# Patient Record
Sex: Male | Born: 1940 | Race: White | Hispanic: No | Smoking: Former smoker
Health system: Southern US, Community
[De-identification: ages and names within clinical notes are randomized; demographics above are authoritative.]

## PROBLEM LIST (undated history)

## (undated) DIAGNOSIS — I509 Heart failure, unspecified: Secondary | ICD-10-CM

## (undated) DIAGNOSIS — M1990 Unspecified osteoarthritis, unspecified site: Secondary | ICD-10-CM

## (undated) DIAGNOSIS — I629 Nontraumatic intracranial hemorrhage, unspecified: Secondary | ICD-10-CM

## (undated) DIAGNOSIS — N400 Enlarged prostate without lower urinary tract symptoms: Secondary | ICD-10-CM

## (undated) DIAGNOSIS — Z950 Presence of cardiac pacemaker: Secondary | ICD-10-CM

## (undated) DIAGNOSIS — J40 Bronchitis, not specified as acute or chronic: Secondary | ICD-10-CM

## (undated) DIAGNOSIS — J42 Unspecified chronic bronchitis: Secondary | ICD-10-CM

## (undated) DIAGNOSIS — I272 Pulmonary hypertension, unspecified: Secondary | ICD-10-CM

## (undated) DIAGNOSIS — E119 Type 2 diabetes mellitus without complications: Secondary | ICD-10-CM

## (undated) DIAGNOSIS — I10 Essential (primary) hypertension: Secondary | ICD-10-CM

## (undated) DIAGNOSIS — R9389 Abnormal findings on diagnostic imaging of other specified body structures: Secondary | ICD-10-CM

## (undated) DIAGNOSIS — G43909 Migraine, unspecified, not intractable, without status migrainosus: Secondary | ICD-10-CM

## (undated) DIAGNOSIS — E785 Hyperlipidemia, unspecified: Secondary | ICD-10-CM

## (undated) DIAGNOSIS — J90 Pleural effusion, not elsewhere classified: Secondary | ICD-10-CM

## (undated) DIAGNOSIS — G4700 Insomnia, unspecified: Secondary | ICD-10-CM

## (undated) DIAGNOSIS — I5032 Chronic diastolic (congestive) heart failure: Secondary | ICD-10-CM

## (undated) DIAGNOSIS — Z9981 Dependence on supplemental oxygen: Secondary | ICD-10-CM

## (undated) DIAGNOSIS — R011 Cardiac murmur, unspecified: Secondary | ICD-10-CM

## (undated) DIAGNOSIS — I4891 Unspecified atrial fibrillation: Secondary | ICD-10-CM

## (undated) DIAGNOSIS — I739 Peripheral vascular disease, unspecified: Secondary | ICD-10-CM

## (undated) DIAGNOSIS — I2510 Atherosclerotic heart disease of native coronary artery without angina pectoris: Secondary | ICD-10-CM

## (undated) DIAGNOSIS — D649 Anemia, unspecified: Secondary | ICD-10-CM

## (undated) HISTORY — PX: CARDIAC CATHETERIZATION: SHX172

## (undated) HISTORY — DX: Hyperlipidemia, unspecified: E78.5

## (undated) HISTORY — DX: Pulmonary hypertension, unspecified: I27.20

## (undated) HISTORY — DX: Chronic diastolic (congestive) heart failure: I50.32

## (undated) HISTORY — DX: Benign prostatic hyperplasia without lower urinary tract symptoms: N40.0

## (undated) HISTORY — DX: Abnormal findings on diagnostic imaging of other specified body structures: R93.89

## (undated) HISTORY — PX: TONSILLECTOMY: SUR1361

## (undated) HISTORY — DX: Bronchitis, not specified as acute or chronic: J40

## (undated) HISTORY — DX: Pleural effusion, not elsewhere classified: J90

## (undated) HISTORY — PX: PLEURAL SCARIFICATION: SHX748

## (undated) HISTORY — DX: Essential (primary) hypertension: I10

## (undated) HISTORY — DX: Atherosclerotic heart disease of native coronary artery without angina pectoris: I25.10

## (undated) HISTORY — PX: OTHER SURGICAL HISTORY: SHX169

## (undated) HISTORY — DX: Nontraumatic intracranial hemorrhage, unspecified: I62.9

## (undated) HISTORY — PX: LUNG DECORTICATION: SHX454

## (undated) HISTORY — DX: Insomnia, unspecified: G47.00

---

## 1997-10-11 DIAGNOSIS — E119 Type 2 diabetes mellitus without complications: Secondary | ICD-10-CM

## 1997-10-11 HISTORY — DX: Type 2 diabetes mellitus without complications: E11.9

## 2003-12-03 ENCOUNTER — Encounter: Payer: Self-pay | Admitting: Internal Medicine

## 2003-12-18 ENCOUNTER — Ambulatory Visit (HOSPITAL_COMMUNITY): Admission: RE | Admit: 2003-12-18 | Discharge: 2003-12-18 | Payer: Self-pay | Admitting: Cardiology

## 2004-04-02 ENCOUNTER — Ambulatory Visit (HOSPITAL_COMMUNITY): Admission: RE | Admit: 2004-04-02 | Discharge: 2004-04-02 | Payer: Self-pay | Admitting: Cardiology

## 2004-05-12 ENCOUNTER — Ambulatory Visit (HOSPITAL_COMMUNITY): Admission: RE | Admit: 2004-05-12 | Discharge: 2004-05-12 | Payer: Self-pay | Admitting: Cardiology

## 2004-07-22 ENCOUNTER — Encounter: Payer: Self-pay | Admitting: Internal Medicine

## 2004-07-22 ENCOUNTER — Inpatient Hospital Stay (HOSPITAL_COMMUNITY): Admission: AD | Admit: 2004-07-22 | Discharge: 2004-07-23 | Payer: Self-pay | Admitting: Internal Medicine

## 2004-08-18 ENCOUNTER — Ambulatory Visit: Payer: Self-pay | Admitting: *Deleted

## 2004-09-07 ENCOUNTER — Ambulatory Visit: Payer: Self-pay | Admitting: Internal Medicine

## 2004-09-28 ENCOUNTER — Ambulatory Visit: Payer: Self-pay | Admitting: Cardiovascular Disease

## 2004-10-26 ENCOUNTER — Ambulatory Visit: Payer: Self-pay | Admitting: *Deleted

## 2004-11-23 ENCOUNTER — Ambulatory Visit: Payer: Self-pay | Admitting: Cardiology

## 2004-11-26 ENCOUNTER — Ambulatory Visit: Payer: Self-pay | Admitting: Internal Medicine

## 2004-11-30 ENCOUNTER — Ambulatory Visit: Payer: Self-pay | Admitting: Internal Medicine

## 2004-12-08 ENCOUNTER — Ambulatory Visit: Payer: Self-pay | Admitting: Internal Medicine

## 2004-12-16 ENCOUNTER — Ambulatory Visit: Payer: Self-pay | Admitting: *Deleted

## 2004-12-21 ENCOUNTER — Ambulatory Visit: Payer: Self-pay | Admitting: Cardiology

## 2005-01-18 ENCOUNTER — Ambulatory Visit: Payer: Self-pay | Admitting: Cardiology

## 2005-02-15 ENCOUNTER — Ambulatory Visit: Payer: Self-pay | Admitting: *Deleted

## 2005-02-20 ENCOUNTER — Emergency Department (HOSPITAL_COMMUNITY): Admission: EM | Admit: 2005-02-20 | Discharge: 2005-02-20 | Payer: Self-pay | Admitting: Emergency Medicine

## 2005-02-23 ENCOUNTER — Ambulatory Visit: Payer: Self-pay | Admitting: Internal Medicine

## 2005-03-02 ENCOUNTER — Ambulatory Visit: Payer: Self-pay | Admitting: Internal Medicine

## 2005-03-15 ENCOUNTER — Ambulatory Visit: Payer: Self-pay | Admitting: Cardiology

## 2005-04-12 ENCOUNTER — Ambulatory Visit: Payer: Self-pay | Admitting: Cardiology

## 2005-04-20 ENCOUNTER — Ambulatory Visit: Payer: Self-pay | Admitting: *Deleted

## 2005-05-10 ENCOUNTER — Ambulatory Visit: Payer: Self-pay | Admitting: Cardiology

## 2005-06-07 ENCOUNTER — Ambulatory Visit: Payer: Self-pay | Admitting: Cardiology

## 2005-06-07 ENCOUNTER — Ambulatory Visit: Payer: Self-pay | Admitting: *Deleted

## 2005-06-25 ENCOUNTER — Ambulatory Visit: Payer: Self-pay | Admitting: Internal Medicine

## 2005-06-28 ENCOUNTER — Ambulatory Visit: Payer: Self-pay | Admitting: Cardiology

## 2005-07-26 ENCOUNTER — Ambulatory Visit: Payer: Self-pay | Admitting: Cardiology

## 2005-08-16 ENCOUNTER — Ambulatory Visit: Payer: Self-pay | Admitting: Cardiology

## 2005-09-06 ENCOUNTER — Ambulatory Visit: Payer: Self-pay | Admitting: Cardiology

## 2005-10-08 ENCOUNTER — Ambulatory Visit: Payer: Self-pay | Admitting: Cardiology

## 2005-10-25 ENCOUNTER — Ambulatory Visit: Payer: Self-pay | Admitting: Internal Medicine

## 2005-10-28 ENCOUNTER — Ambulatory Visit: Payer: Self-pay | Admitting: *Deleted

## 2005-10-28 ENCOUNTER — Ambulatory Visit: Payer: Self-pay | Admitting: Cardiology

## 2005-11-05 ENCOUNTER — Ambulatory Visit: Payer: Self-pay

## 2005-11-11 ENCOUNTER — Ambulatory Visit: Payer: Self-pay | Admitting: Cardiology

## 2005-12-09 ENCOUNTER — Ambulatory Visit: Payer: Self-pay | Admitting: Internal Medicine

## 2005-12-13 ENCOUNTER — Ambulatory Visit: Payer: Self-pay | Admitting: Internal Medicine

## 2005-12-30 ENCOUNTER — Ambulatory Visit: Payer: Self-pay | Admitting: Cardiology

## 2006-01-20 ENCOUNTER — Ambulatory Visit: Payer: Self-pay | Admitting: Internal Medicine

## 2006-02-17 ENCOUNTER — Ambulatory Visit: Payer: Self-pay | Admitting: Cardiology

## 2006-03-10 ENCOUNTER — Ambulatory Visit: Payer: Self-pay | Admitting: Cardiology

## 2006-04-06 ENCOUNTER — Ambulatory Visit: Payer: Self-pay | Admitting: Cardiology

## 2006-04-18 ENCOUNTER — Ambulatory Visit: Payer: Self-pay | Admitting: Internal Medicine

## 2006-04-21 ENCOUNTER — Ambulatory Visit: Payer: Self-pay | Admitting: Cardiology

## 2006-04-21 ENCOUNTER — Ambulatory Visit: Payer: Self-pay | Admitting: *Deleted

## 2006-04-25 ENCOUNTER — Ambulatory Visit: Payer: Self-pay | Admitting: Internal Medicine

## 2006-05-05 ENCOUNTER — Ambulatory Visit: Payer: Self-pay | Admitting: Cardiology

## 2006-05-05 ENCOUNTER — Ambulatory Visit: Payer: Self-pay | Admitting: *Deleted

## 2006-05-16 ENCOUNTER — Ambulatory Visit: Payer: Self-pay | Admitting: Internal Medicine

## 2006-05-19 ENCOUNTER — Ambulatory Visit: Payer: Self-pay | Admitting: *Deleted

## 2006-06-09 ENCOUNTER — Ambulatory Visit: Payer: Self-pay | Admitting: Cardiology

## 2006-06-23 ENCOUNTER — Ambulatory Visit: Payer: Self-pay | Admitting: Cardiology

## 2006-07-07 ENCOUNTER — Ambulatory Visit: Payer: Self-pay | Admitting: Cardiology

## 2006-07-18 ENCOUNTER — Ambulatory Visit: Payer: Self-pay | Admitting: Internal Medicine

## 2006-08-04 ENCOUNTER — Ambulatory Visit: Payer: Self-pay | Admitting: Cardiology

## 2006-08-31 ENCOUNTER — Ambulatory Visit: Payer: Self-pay | Admitting: Cardiology

## 2006-09-13 ENCOUNTER — Encounter: Payer: Self-pay | Admitting: Internal Medicine

## 2006-09-28 ENCOUNTER — Ambulatory Visit: Payer: Self-pay | Admitting: *Deleted

## 2006-10-11 DIAGNOSIS — J90 Pleural effusion, not elsewhere classified: Secondary | ICD-10-CM

## 2006-10-11 HISTORY — DX: Pleural effusion, not elsewhere classified: J90

## 2006-10-18 ENCOUNTER — Ambulatory Visit: Payer: Self-pay | Admitting: Internal Medicine

## 2006-10-18 ENCOUNTER — Encounter: Admission: RE | Admit: 2006-10-18 | Discharge: 2006-10-18 | Payer: Self-pay | Admitting: Internal Medicine

## 2006-10-23 ENCOUNTER — Ambulatory Visit: Payer: Self-pay | Admitting: Pulmonary Disease

## 2006-10-23 ENCOUNTER — Inpatient Hospital Stay (HOSPITAL_COMMUNITY): Admission: EM | Admit: 2006-10-23 | Discharge: 2006-10-26 | Payer: Self-pay | Admitting: Emergency Medicine

## 2006-10-23 ENCOUNTER — Ambulatory Visit: Payer: Self-pay | Admitting: Internal Medicine

## 2006-10-24 ENCOUNTER — Encounter: Payer: Self-pay | Admitting: Internal Medicine

## 2006-10-31 ENCOUNTER — Ambulatory Visit: Payer: Self-pay | Admitting: Internal Medicine

## 2006-10-31 ENCOUNTER — Ambulatory Visit: Payer: Self-pay | Admitting: Cardiology

## 2006-10-31 LAB — CONVERTED CEMR LAB
BUN: 10 mg/dL (ref 6–23)
CO2: 29 meq/L (ref 19–32)
Calcium: 9 mg/dL (ref 8.4–10.5)
Chloride: 104 meq/L (ref 96–112)
Creatinine, Ser: 0.9 mg/dL (ref 0.4–1.5)
GFR calc Af Amer: 109 mL/min
GFR calc non Af Amer: 90 mL/min
Glucose, Bld: 197 mg/dL — ABNORMAL HIGH (ref 70–99)
Potassium: 4.5 meq/L (ref 3.5–5.1)
Sodium: 139 meq/L (ref 135–145)

## 2006-11-11 ENCOUNTER — Ambulatory Visit: Payer: Self-pay | Admitting: Cardiology

## 2006-12-01 ENCOUNTER — Ambulatory Visit: Payer: Self-pay | Admitting: Internal Medicine

## 2006-12-09 ENCOUNTER — Ambulatory Visit: Payer: Self-pay | Admitting: Internal Medicine

## 2006-12-26 ENCOUNTER — Ambulatory Visit: Payer: Self-pay | Admitting: Cardiology

## 2007-01-02 ENCOUNTER — Encounter: Payer: Self-pay | Admitting: Internal Medicine

## 2007-01-02 ENCOUNTER — Ambulatory Visit: Payer: Self-pay | Admitting: Internal Medicine

## 2007-01-02 DIAGNOSIS — I482 Chronic atrial fibrillation, unspecified: Secondary | ICD-10-CM | POA: Insufficient documentation

## 2007-01-02 DIAGNOSIS — E119 Type 2 diabetes mellitus without complications: Secondary | ICD-10-CM | POA: Insufficient documentation

## 2007-01-02 DIAGNOSIS — I4891 Unspecified atrial fibrillation: Secondary | ICD-10-CM | POA: Insufficient documentation

## 2007-01-02 DIAGNOSIS — N400 Enlarged prostate without lower urinary tract symptoms: Secondary | ICD-10-CM | POA: Insufficient documentation

## 2007-01-02 LAB — CONVERTED CEMR LAB
BUN: 9 mg/dL (ref 6–23)
CO2: 30 meq/L (ref 19–32)
Calcium: 9 mg/dL (ref 8.4–10.5)
Chloride: 103 meq/L (ref 96–112)
Creatinine, Ser: 0.8 mg/dL (ref 0.4–1.5)
GFR calc Af Amer: 125 mL/min
GFR calc non Af Amer: 103 mL/min
Glucose, Bld: 194 mg/dL — ABNORMAL HIGH (ref 70–99)
Hgb A1c MFr Bld: 8.4 % — ABNORMAL HIGH (ref 4.6–6.0)
Potassium: 4.5 meq/L (ref 3.5–5.1)
Sodium: 141 meq/L (ref 135–145)

## 2007-01-09 ENCOUNTER — Ambulatory Visit: Payer: Self-pay | Admitting: Internal Medicine

## 2007-02-06 ENCOUNTER — Ambulatory Visit: Payer: Self-pay | Admitting: Cardiology

## 2007-02-27 ENCOUNTER — Ambulatory Visit: Payer: Self-pay | Admitting: Internal Medicine

## 2007-03-22 ENCOUNTER — Encounter: Admission: RE | Admit: 2007-03-22 | Discharge: 2007-03-22 | Payer: Self-pay | Admitting: Internal Medicine

## 2007-03-22 ENCOUNTER — Ambulatory Visit: Payer: Self-pay | Admitting: Internal Medicine

## 2007-03-23 ENCOUNTER — Encounter: Payer: Self-pay | Admitting: Internal Medicine

## 2007-03-24 ENCOUNTER — Ambulatory Visit: Payer: Self-pay | Admitting: Internal Medicine

## 2007-03-24 LAB — CONVERTED CEMR LAB
Bilirubin Urine: NEGATIVE
Hemoglobin, Urine: NEGATIVE
Ketones, ur: NEGATIVE mg/dL
Leukocytes, UA: NEGATIVE
Nitrite: NEGATIVE
Protein, ur: NEGATIVE mg/dL
Specific Gravity, Urine: 1.026 (ref 1.005–1.03)
Urine Glucose: 500 mg/dL — AB
Urobilinogen, UA: 0.2 (ref 0.0–1.0)
pH: 6 (ref 5.0–8.0)

## 2007-03-25 ENCOUNTER — Encounter: Payer: Self-pay | Admitting: Internal Medicine

## 2007-03-27 ENCOUNTER — Ambulatory Visit: Payer: Self-pay | Admitting: Cardiology

## 2007-03-28 ENCOUNTER — Ambulatory Visit: Payer: Self-pay | Admitting: Cardiology

## 2007-04-03 ENCOUNTER — Ambulatory Visit: Payer: Self-pay | Admitting: Internal Medicine

## 2007-04-03 LAB — CONVERTED CEMR LAB
BUN: 11 mg/dL (ref 6–23)
CO2: 30 meq/L (ref 19–32)
Calcium: 8.5 mg/dL (ref 8.4–10.5)
Chloride: 108 meq/L (ref 96–112)
Creatinine, Ser: 0.8 mg/dL (ref 0.4–1.5)
GFR calc Af Amer: 124 mL/min
GFR calc non Af Amer: 103 mL/min
Glucose, Bld: 184 mg/dL — ABNORMAL HIGH (ref 70–99)
Potassium: 4.4 meq/L (ref 3.5–5.1)
Pro B Natriuretic peptide (BNP): 122 pg/mL — ABNORMAL HIGH (ref 0.0–100.0)
Sodium: 141 meq/L (ref 135–145)

## 2007-04-10 ENCOUNTER — Encounter: Payer: Self-pay | Admitting: Internal Medicine

## 2007-04-10 ENCOUNTER — Ambulatory Visit: Payer: Self-pay

## 2007-04-10 LAB — CONVERTED CEMR LAB
ALT: 22 units/L (ref 0–53)
AST: 18 units/L (ref 0–37)
Albumin: 3.5 g/dL (ref 3.5–5.2)
Alkaline Phosphatase: 93 units/L (ref 39–117)
BUN: 10 mg/dL (ref 6–23)
Basophils Absolute: 0 10*3/uL (ref 0.0–0.1)
Basophils Relative: 0.1 % (ref 0.0–1.0)
Bilirubin, Direct: 0.1 mg/dL (ref 0.0–0.3)
CO2: 33 meq/L — ABNORMAL HIGH (ref 19–32)
Calcium: 9.2 mg/dL (ref 8.4–10.5)
Chloride: 106 meq/L (ref 96–112)
Cholesterol: 146 mg/dL (ref 0–200)
Creatinine, Ser: 0.8 mg/dL (ref 0.4–1.5)
Eosinophils Absolute: 0.1 10*3/uL (ref 0.0–0.6)
Eosinophils Relative: 1.4 % (ref 0.0–5.0)
GFR calc Af Amer: 124 mL/min
GFR calc non Af Amer: 103 mL/min
Glucose, Bld: 178 mg/dL — ABNORMAL HIGH (ref 70–99)
HCT: 43.3 % (ref 39.0–52.0)
HDL: 34 mg/dL — ABNORMAL LOW (ref >39.0)
Hemoglobin: 14.3 g/dL (ref 13.0–17.0)
Hgb A1c MFr Bld: 8.5 % — ABNORMAL HIGH (ref 4.6–6.0)
LDL Cholesterol: 95 mg/dL (ref 0–99)
Lymphocytes Relative: 17.3 % (ref 12.0–46.0)
MCHC: 32.9 g/dL (ref 30.0–36.0)
MCV: 87.3 fL (ref 78.0–100.0)
Monocytes Absolute: 0.8 10*3/uL — ABNORMAL HIGH (ref 0.2–0.7)
Monocytes Relative: 9.7 % (ref 3.0–11.0)
Neutro Abs: 6.3 10*3/uL (ref 1.4–7.7)
Neutrophils Relative %: 71.5 % (ref 43.0–77.0)
Platelets: 246 10*3/uL (ref 150–400)
Potassium: 4.6 meq/L (ref 3.5–5.1)
RBC: 4.95 M/uL (ref 4.22–5.81)
RDW: 13.3 % (ref 11.5–14.6)
Sodium: 144 meq/L (ref 135–145)
Total Bilirubin: 0.7 mg/dL (ref 0.3–1.2)
Total CHOL/HDL Ratio: 4.3
Total Protein: 7.1 g/dL (ref 6.0–8.3)
Triglycerides: 86 mg/dL (ref 0–149)
VLDL: 17 mg/dL (ref 0–40)
WBC: 8.7 10*3/uL (ref 4.5–10.5)

## 2007-04-12 ENCOUNTER — Ambulatory Visit: Payer: Self-pay | Admitting: Internal Medicine

## 2007-04-17 ENCOUNTER — Ambulatory Visit: Payer: Self-pay | Admitting: Cardiovascular Disease

## 2007-04-29 LAB — CONVERTED CEMR LAB
Creatinine,U: 147.1 mg/dL
Microalb Creat Ratio: 8.8 mg/g (ref 0.0–30.0)
Microalb, Ur: 1.3 mg/dL (ref 0.0–1.9)

## 2007-05-02 ENCOUNTER — Ambulatory Visit: Payer: Self-pay | Admitting: Internal Medicine

## 2007-05-05 ENCOUNTER — Ambulatory Visit: Payer: Self-pay | Admitting: Internal Medicine

## 2007-05-05 LAB — CONVERTED CEMR LAB
BUN: 14 mg/dL (ref 6–23)
Basophils Absolute: 0 10*3/uL (ref 0.0–0.1)
Basophils Relative: 0.2 % (ref 0.0–1.0)
CO2: 32 meq/L (ref 19–32)
Calcium: 8.9 mg/dL (ref 8.4–10.5)
Chloride: 102 meq/L (ref 96–112)
Creatinine, Ser: 0.9 mg/dL (ref 0.4–1.5)
Eosinophils Absolute: 0.1 10*3/uL (ref 0.0–0.6)
Eosinophils Relative: 1.4 % (ref 0.0–5.0)
GFR calc Af Amer: 109 mL/min
GFR calc non Af Amer: 90 mL/min
Glucose, Bld: 226 mg/dL — ABNORMAL HIGH (ref 70–99)
HCT: 41.1 % (ref 39.0–52.0)
Hemoglobin: 14 g/dL (ref 13.0–17.0)
INR: 1.4 (ref 0.9–2.0)
Lymphocytes Relative: 16.3 % (ref 12.0–46.0)
MCHC: 34.1 g/dL (ref 30.0–36.0)
MCV: 86.5 fL (ref 78.0–100.0)
Monocytes Absolute: 0.7 10*3/uL (ref 0.2–0.7)
Monocytes Relative: 7.9 % (ref 3.0–11.0)
Neutro Abs: 6.5 10*3/uL (ref 1.4–7.7)
Neutrophils Relative %: 74.2 % (ref 43.0–77.0)
Platelets: 272 10*3/uL (ref 150–400)
Potassium: 4.2 meq/L (ref 3.5–5.1)
Prothrombin Time: 14.3 s — ABNORMAL HIGH (ref 10.0–14.0)
RBC: 4.75 M/uL (ref 4.22–5.81)
RDW: 12.9 % (ref 11.5–14.6)
Sodium: 140 meq/L (ref 135–145)
WBC: 8.7 10*3/uL (ref 4.5–10.5)
aPTT: 33.1 s (ref 26.5–36.5)

## 2007-05-10 ENCOUNTER — Ambulatory Visit: Payer: Self-pay | Admitting: Internal Medicine

## 2007-05-10 ENCOUNTER — Inpatient Hospital Stay (HOSPITAL_BASED_OUTPATIENT_CLINIC_OR_DEPARTMENT_OTHER): Admission: RE | Admit: 2007-05-10 | Discharge: 2007-05-10 | Payer: Self-pay | Admitting: Cardiology

## 2007-05-10 ENCOUNTER — Ambulatory Visit: Payer: Self-pay | Admitting: Cardiology

## 2007-05-15 ENCOUNTER — Encounter: Payer: Self-pay | Admitting: Internal Medicine

## 2007-05-15 ENCOUNTER — Ambulatory Visit: Payer: Self-pay | Admitting: Cardiology

## 2007-05-15 LAB — CONVERTED CEMR LAB
BUN: 14 mg/dL (ref 6–23)
CO2: 32 meq/L (ref 19–32)
Calcium: 9.3 mg/dL (ref 8.4–10.5)
Chloride: 105 meq/L (ref 96–112)
Creatinine, Ser: 1.1 mg/dL (ref 0.4–1.5)
GFR calc Af Amer: 86 mL/min
GFR calc non Af Amer: 71 mL/min
Glucose, Bld: 190 mg/dL — ABNORMAL HIGH (ref 70–99)
Potassium: 5.4 meq/L — ABNORMAL HIGH (ref 3.5–5.1)
Sodium: 144 meq/L (ref 135–145)

## 2007-05-16 ENCOUNTER — Ambulatory Visit: Payer: Self-pay | Admitting: Cardiology

## 2007-05-16 ENCOUNTER — Ambulatory Visit: Payer: Self-pay | Admitting: Internal Medicine

## 2007-05-17 ENCOUNTER — Ambulatory Visit: Payer: Self-pay | Admitting: Internal Medicine

## 2007-05-17 LAB — CONVERTED CEMR LAB
Basophils Absolute: 0 10*3/uL (ref 0.0–0.1)
Basophils Relative: 0.3 % (ref 0.0–1.0)
Eosinophils Absolute: 0.1 10*3/uL (ref 0.0–0.6)
Eosinophils Relative: 1.4 % (ref 0.0–5.0)
HCT: 42.6 % (ref 39.0–52.0)
Hemoglobin: 14.3 g/dL (ref 13.0–17.0)
Lymphocytes Relative: 14.8 % (ref 12.0–46.0)
MCHC: 33.5 g/dL (ref 30.0–36.0)
MCV: 86 fL (ref 78.0–100.0)
Monocytes Absolute: 0.8 10*3/uL — ABNORMAL HIGH (ref 0.2–0.7)
Monocytes Relative: 7.8 % (ref 3.0–11.0)
Neutro Abs: 7.4 10*3/uL (ref 1.4–7.7)
Neutrophils Relative %: 75.7 % (ref 43.0–77.0)
Platelets: 328 10*3/uL (ref 150–400)
Pro B Natriuretic peptide (BNP): 92 pg/mL (ref 0.0–100.0)
RBC: 4.96 M/uL (ref 4.22–5.81)
RDW: 13.1 % (ref 11.5–14.6)
Sed Rate: 66 mm/hr — ABNORMAL HIGH (ref 0–20)
WBC: 9.7 10*3/uL (ref 4.5–10.5)

## 2007-05-24 ENCOUNTER — Ambulatory Visit (HOSPITAL_COMMUNITY): Admission: RE | Admit: 2007-05-24 | Discharge: 2007-05-24 | Payer: Self-pay | Admitting: Internal Medicine

## 2007-05-24 ENCOUNTER — Encounter (INDEPENDENT_AMBULATORY_CARE_PROVIDER_SITE_OTHER): Payer: Self-pay | Admitting: Interventional Radiology

## 2007-06-02 ENCOUNTER — Ambulatory Visit: Payer: Self-pay | Admitting: Cardiovascular Disease

## 2007-06-13 ENCOUNTER — Ambulatory Visit: Payer: Self-pay | Admitting: Surgery

## 2007-06-16 ENCOUNTER — Ambulatory Visit: Payer: Self-pay | Admitting: Internal Medicine

## 2007-06-20 ENCOUNTER — Ambulatory Visit: Payer: Self-pay | Admitting: Surgery

## 2007-06-20 ENCOUNTER — Encounter: Admission: RE | Admit: 2007-06-20 | Discharge: 2007-06-20 | Payer: Self-pay | Admitting: Surgery

## 2007-06-20 ENCOUNTER — Telehealth (INDEPENDENT_AMBULATORY_CARE_PROVIDER_SITE_OTHER): Payer: Self-pay | Admitting: *Deleted

## 2007-07-07 ENCOUNTER — Ambulatory Visit: Payer: Self-pay | Admitting: Cardiovascular Disease

## 2007-07-21 ENCOUNTER — Ambulatory Visit: Payer: Self-pay | Admitting: Cardiology

## 2007-08-10 ENCOUNTER — Ambulatory Visit: Payer: Self-pay | Admitting: Surgery

## 2007-08-10 ENCOUNTER — Encounter: Admission: RE | Admit: 2007-08-10 | Discharge: 2007-08-10 | Payer: Self-pay | Admitting: Surgery

## 2007-08-11 ENCOUNTER — Ambulatory Visit: Payer: Self-pay | Admitting: Cardiology

## 2007-08-11 ENCOUNTER — Encounter: Admission: RE | Admit: 2007-08-11 | Discharge: 2007-08-11 | Payer: Self-pay | Admitting: Surgery

## 2007-08-14 ENCOUNTER — Ambulatory Visit: Payer: Self-pay | Admitting: Internal Medicine

## 2007-08-14 DIAGNOSIS — F419 Anxiety disorder, unspecified: Secondary | ICD-10-CM | POA: Insufficient documentation

## 2007-08-16 LAB — CONVERTED CEMR LAB
BUN: 9 mg/dL (ref 6–23)
CO2: 30 meq/L (ref 19–32)
Calcium: 8.9 mg/dL (ref 8.4–10.5)
Chloride: 102 meq/L (ref 96–112)
Creatinine, Ser: 0.8 mg/dL (ref 0.4–1.5)
GFR calc Af Amer: 124 mL/min
GFR calc non Af Amer: 103 mL/min
Glucose, Bld: 143 mg/dL — ABNORMAL HIGH (ref 70–99)
Hgb A1c MFr Bld: 7 % — ABNORMAL HIGH (ref 4.6–6.0)
Potassium: 4.2 meq/L (ref 3.5–5.1)
Sodium: 140 meq/L (ref 135–145)

## 2007-08-18 ENCOUNTER — Ambulatory Visit: Payer: Self-pay | Admitting: Surgery

## 2007-08-18 ENCOUNTER — Inpatient Hospital Stay (HOSPITAL_COMMUNITY): Admission: RE | Admit: 2007-08-18 | Discharge: 2007-08-22 | Payer: Self-pay | Admitting: Surgery

## 2007-09-05 ENCOUNTER — Encounter: Admission: RE | Admit: 2007-09-05 | Discharge: 2007-09-05 | Payer: Self-pay | Admitting: Surgery

## 2007-09-05 ENCOUNTER — Encounter: Payer: Self-pay | Admitting: Internal Medicine

## 2007-09-05 ENCOUNTER — Ambulatory Visit: Payer: Self-pay | Admitting: Surgery

## 2007-09-06 ENCOUNTER — Ambulatory Visit: Payer: Self-pay | Admitting: Cardiology

## 2007-09-25 ENCOUNTER — Ambulatory Visit: Payer: Self-pay | Admitting: Internal Medicine

## 2007-09-25 ENCOUNTER — Telehealth (INDEPENDENT_AMBULATORY_CARE_PROVIDER_SITE_OTHER): Payer: Self-pay | Admitting: *Deleted

## 2007-09-26 ENCOUNTER — Telehealth (INDEPENDENT_AMBULATORY_CARE_PROVIDER_SITE_OTHER): Payer: Self-pay | Admitting: *Deleted

## 2007-10-18 ENCOUNTER — Ambulatory Visit: Payer: Self-pay | Admitting: Cardiovascular Disease

## 2007-10-24 ENCOUNTER — Telehealth: Payer: Self-pay | Admitting: Internal Medicine

## 2007-11-09 ENCOUNTER — Ambulatory Visit: Payer: Self-pay | Admitting: Internal Medicine

## 2007-11-09 ENCOUNTER — Ambulatory Visit: Payer: Self-pay | Admitting: Cardiology

## 2007-11-27 ENCOUNTER — Telehealth: Payer: Self-pay | Admitting: Internal Medicine

## 2007-12-13 ENCOUNTER — Ambulatory Visit: Payer: Self-pay | Admitting: Internal Medicine

## 2007-12-13 DIAGNOSIS — E785 Hyperlipidemia, unspecified: Secondary | ICD-10-CM | POA: Insufficient documentation

## 2007-12-13 DIAGNOSIS — I10 Essential (primary) hypertension: Secondary | ICD-10-CM | POA: Insufficient documentation

## 2007-12-13 DIAGNOSIS — E1169 Type 2 diabetes mellitus with other specified complication: Secondary | ICD-10-CM | POA: Insufficient documentation

## 2007-12-15 ENCOUNTER — Ambulatory Visit: Payer: Self-pay | Admitting: Cardiology

## 2007-12-19 LAB — CONVERTED CEMR LAB
BUN: 9 mg/dL (ref 6–23)
CO2: 31 meq/L (ref 19–32)
Calcium: 9.4 mg/dL (ref 8.4–10.5)
Chloride: 102 meq/L (ref 96–112)
Creatinine, Ser: 0.9 mg/dL (ref 0.4–1.5)
GFR calc Af Amer: 109 mL/min
GFR calc non Af Amer: 90 mL/min
Glucose, Bld: 109 mg/dL — ABNORMAL HIGH (ref 70–99)
Hgb A1c MFr Bld: 6.5 % — ABNORMAL HIGH (ref 4.6–6.0)
Potassium: 4.5 meq/L (ref 3.5–5.1)
Sodium: 141 meq/L (ref 135–145)

## 2008-01-05 ENCOUNTER — Ambulatory Visit: Payer: Self-pay | Admitting: Cardiology

## 2008-02-07 ENCOUNTER — Ambulatory Visit: Payer: Self-pay | Admitting: Cardiology

## 2008-02-16 ENCOUNTER — Ambulatory Visit: Payer: Self-pay | Admitting: Cardiology

## 2008-03-08 ENCOUNTER — Ambulatory Visit: Payer: Self-pay | Admitting: Cardiology

## 2008-04-01 ENCOUNTER — Telehealth (INDEPENDENT_AMBULATORY_CARE_PROVIDER_SITE_OTHER): Payer: Self-pay | Admitting: *Deleted

## 2008-04-05 ENCOUNTER — Ambulatory Visit: Payer: Self-pay | Admitting: Cardiology

## 2008-04-08 ENCOUNTER — Ambulatory Visit: Payer: Self-pay | Admitting: Internal Medicine

## 2008-04-09 ENCOUNTER — Encounter (INDEPENDENT_AMBULATORY_CARE_PROVIDER_SITE_OTHER): Payer: Self-pay | Admitting: *Deleted

## 2008-04-11 ENCOUNTER — Ambulatory Visit: Payer: Self-pay | Admitting: Internal Medicine

## 2008-04-15 ENCOUNTER — Encounter (INDEPENDENT_AMBULATORY_CARE_PROVIDER_SITE_OTHER): Payer: Self-pay | Admitting: *Deleted

## 2008-04-15 LAB — CONVERTED CEMR LAB
ALT: 22 units/L (ref 0–53)
AST: 20 units/L (ref 0–37)
Cholesterol: 121 mg/dL (ref 0–200)
Creatinine,U: 98.6 mg/dL
HDL: 37.3 mg/dL — ABNORMAL LOW (ref >39.0)
Hgb A1c MFr Bld: 6.5 % — ABNORMAL HIGH (ref 4.6–6.0)
LDL Cholesterol: 68 mg/dL (ref 0–99)
Microalb Creat Ratio: 16.2 mg/g (ref 0.0–30.0)
Microalb, Ur: 1.6 mg/dL (ref 0.0–1.9)
Total CHOL/HDL Ratio: 3.2
Triglycerides: 81 mg/dL (ref 0–149)
VLDL: 16 mg/dL (ref 0–40)

## 2008-04-19 ENCOUNTER — Encounter: Payer: Self-pay | Admitting: Internal Medicine

## 2008-04-22 ENCOUNTER — Ambulatory Visit: Payer: Self-pay | Admitting: Cardiology

## 2008-05-20 ENCOUNTER — Ambulatory Visit: Payer: Self-pay | Admitting: Cardiology

## 2008-06-10 ENCOUNTER — Ambulatory Visit: Payer: Self-pay | Admitting: Cardiology

## 2008-06-11 ENCOUNTER — Ambulatory Visit: Payer: Self-pay | Admitting: Internal Medicine

## 2008-06-13 ENCOUNTER — Encounter (INDEPENDENT_AMBULATORY_CARE_PROVIDER_SITE_OTHER): Payer: Self-pay | Admitting: *Deleted

## 2008-06-13 LAB — CONVERTED CEMR LAB
BUN: 15 mg/dL (ref 6–23)
Basophils Absolute: 0 10*3/uL (ref 0.0–0.1)
Basophils Relative: 0.1 % (ref 0.0–3.0)
CO2: 29 meq/L (ref 19–32)
Calcium: 8.9 mg/dL (ref 8.4–10.5)
Chloride: 109 meq/L (ref 96–112)
Creatinine, Ser: 0.9 mg/dL (ref 0.4–1.5)
Eosinophils Absolute: 0.1 10*3/uL (ref 0.0–0.7)
Eosinophils Relative: 1.2 % (ref 0.0–5.0)
GFR calc Af Amer: 108 mL/min
GFR calc non Af Amer: 89 mL/min
Glucose, Bld: 124 mg/dL — ABNORMAL HIGH (ref 70–99)
HCT: 46 % (ref 39.0–52.0)
Hemoglobin: 15.6 g/dL (ref 13.0–17.0)
Lymphocytes Relative: 18.8 % (ref 12.0–46.0)
MCHC: 33.8 g/dL (ref 30.0–36.0)
MCV: 90.2 fL (ref 78.0–100.0)
Monocytes Absolute: 0.7 10*3/uL (ref 0.1–1.0)
Monocytes Relative: 9.2 % (ref 3.0–12.0)
Neutro Abs: 5.8 10*3/uL (ref 1.4–7.7)
Neutrophils Relative %: 70.7 % (ref 43.0–77.0)
Platelets: 212 10*3/uL (ref 150–400)
Potassium: 4.5 meq/L (ref 3.5–5.1)
RBC: 5.1 M/uL (ref 4.22–5.81)
RDW: 14.6 % (ref 11.5–14.6)
Sodium: 143 meq/L (ref 135–145)
TSH: 2.16 microintl units/mL (ref 0.35–5.50)
WBC: 8.1 10*3/uL (ref 4.5–10.5)

## 2008-06-18 ENCOUNTER — Ambulatory Visit: Payer: Self-pay | Admitting: Internal Medicine

## 2008-06-18 ENCOUNTER — Encounter (INDEPENDENT_AMBULATORY_CARE_PROVIDER_SITE_OTHER): Payer: Self-pay | Admitting: *Deleted

## 2008-06-18 LAB — CONVERTED CEMR LAB
OCCULT 1: NEGATIVE
OCCULT 2: NEGATIVE
OCCULT 3: NEGATIVE

## 2008-06-24 ENCOUNTER — Ambulatory Visit: Payer: Self-pay | Admitting: Cardiology

## 2008-07-15 ENCOUNTER — Ambulatory Visit: Payer: Self-pay | Admitting: Cardiology

## 2008-08-12 ENCOUNTER — Ambulatory Visit: Payer: Self-pay | Admitting: Cardiovascular Disease

## 2008-09-09 ENCOUNTER — Ambulatory Visit: Payer: Self-pay | Admitting: Internal Medicine

## 2008-09-09 ENCOUNTER — Inpatient Hospital Stay (HOSPITAL_COMMUNITY): Admission: EM | Admit: 2008-09-09 | Discharge: 2008-09-17 | Payer: Self-pay | Admitting: Emergency Medicine

## 2008-09-09 ENCOUNTER — Ambulatory Visit: Payer: Self-pay | Admitting: Emergency Medicine

## 2008-09-13 ENCOUNTER — Encounter: Payer: Self-pay | Admitting: Emergency Medicine

## 2008-09-13 ENCOUNTER — Ambulatory Visit: Payer: Self-pay | Admitting: Surgery

## 2008-09-19 ENCOUNTER — Ambulatory Visit: Payer: Self-pay | Admitting: Cardiology

## 2008-09-23 ENCOUNTER — Encounter: Payer: Self-pay | Admitting: Internal Medicine

## 2008-09-23 ENCOUNTER — Encounter (INDEPENDENT_AMBULATORY_CARE_PROVIDER_SITE_OTHER): Payer: Self-pay | Admitting: *Deleted

## 2008-09-23 ENCOUNTER — Telehealth (INDEPENDENT_AMBULATORY_CARE_PROVIDER_SITE_OTHER): Payer: Self-pay | Admitting: *Deleted

## 2008-09-23 LAB — CONVERTED CEMR LAB
INR: 1.7
Prothrombin Time: 21.1 s

## 2008-09-24 ENCOUNTER — Ambulatory Visit: Payer: Self-pay | Admitting: Internal Medicine

## 2008-09-27 ENCOUNTER — Telehealth: Payer: Self-pay | Admitting: Internal Medicine

## 2008-09-30 ENCOUNTER — Encounter: Payer: Self-pay | Admitting: Internal Medicine

## 2008-10-01 ENCOUNTER — Telehealth: Payer: Self-pay | Admitting: Internal Medicine

## 2008-10-01 ENCOUNTER — Encounter: Payer: Self-pay | Admitting: Internal Medicine

## 2008-10-08 ENCOUNTER — Ambulatory Visit: Payer: Self-pay | Admitting: Internal Medicine

## 2008-10-09 ENCOUNTER — Encounter: Payer: Self-pay | Admitting: Internal Medicine

## 2008-10-14 LAB — CONVERTED CEMR LAB
ALT: 29 units/L (ref 0–53)
AST: 23 units/L (ref 0–37)
Albumin: 3.5 g/dL (ref 3.5–5.2)
Alkaline Phosphatase: 78 units/L (ref 39–117)
Bilirubin, Direct: 0.1 mg/dL (ref 0.0–0.3)
HCT: 41 % (ref 39.0–52.0)
Hemoglobin: 14.1 g/dL (ref 13.0–17.0)
Hgb A1c MFr Bld: 6.3 % — ABNORMAL HIGH (ref 4.6–6.0)
MCHC: 34.5 g/dL (ref 30.0–36.0)
MCV: 89.8 fL (ref 78.0–100.0)
Platelets: 220 10*3/uL (ref 150–400)
RBC: 4.57 M/uL (ref 4.22–5.81)
RDW: 13.2 % (ref 11.5–14.6)
Total Bilirubin: 0.6 mg/dL (ref 0.3–1.2)
Total Protein: 7.5 g/dL (ref 6.0–8.3)
WBC: 7.2 10*3/uL (ref 4.5–10.5)

## 2008-10-15 ENCOUNTER — Ambulatory Visit: Payer: Self-pay | Admitting: Internal Medicine

## 2008-10-17 ENCOUNTER — Ambulatory Visit: Payer: Self-pay | Admitting: Cardiovascular Disease

## 2008-10-23 ENCOUNTER — Encounter: Payer: Self-pay | Admitting: Internal Medicine

## 2008-11-14 ENCOUNTER — Ambulatory Visit: Payer: Self-pay | Admitting: Internal Medicine

## 2008-12-12 ENCOUNTER — Ambulatory Visit: Payer: Self-pay | Admitting: Cardiovascular Disease

## 2009-01-06 ENCOUNTER — Ambulatory Visit: Payer: Self-pay | Admitting: Internal Medicine

## 2009-01-06 DIAGNOSIS — F528 Other sexual dysfunction not due to a substance or known physiological condition: Secondary | ICD-10-CM | POA: Insufficient documentation

## 2009-01-08 LAB — CONVERTED CEMR LAB
BUN: 11 mg/dL (ref 6–23)
CO2: 29 meq/L (ref 19–32)
Calcium: 8.9 mg/dL (ref 8.4–10.5)
Chloride: 107 meq/L (ref 96–112)
Creatinine, Ser: 0.8 mg/dL (ref 0.4–1.5)
GFR calc non Af Amer: 102.21 mL/min (ref >60)
Glucose, Bld: 134 mg/dL — ABNORMAL HIGH (ref 70–99)
Potassium: 4.5 meq/L (ref 3.5–5.1)
Sodium: 142 meq/L (ref 135–145)

## 2009-01-09 ENCOUNTER — Ambulatory Visit: Payer: Self-pay | Admitting: Cardiovascular Disease

## 2009-02-06 ENCOUNTER — Ambulatory Visit: Payer: Self-pay | Admitting: Internal Medicine

## 2009-03-06 ENCOUNTER — Ambulatory Visit: Payer: Self-pay | Admitting: Cardiovascular Disease

## 2009-03-06 LAB — CONVERTED CEMR LAB
POC INR: 2.3
Protime: 18.7

## 2009-03-11 ENCOUNTER — Encounter: Payer: Self-pay | Admitting: *Deleted

## 2009-03-29 ENCOUNTER — Encounter: Payer: Self-pay | Admitting: Internal Medicine

## 2009-03-29 ENCOUNTER — Ambulatory Visit: Payer: Self-pay | Admitting: Vascular Surgery

## 2009-03-29 ENCOUNTER — Inpatient Hospital Stay (HOSPITAL_COMMUNITY): Admission: EM | Admit: 2009-03-29 | Discharge: 2009-04-02 | Payer: Self-pay | Admitting: Emergency Medicine

## 2009-03-29 ENCOUNTER — Ambulatory Visit: Payer: Self-pay | Admitting: Internal Medicine

## 2009-04-07 ENCOUNTER — Ambulatory Visit: Payer: Self-pay | Admitting: Cardiology

## 2009-04-07 LAB — CONVERTED CEMR LAB
POC INR: 2.5
Prothrombin Time: 19.3 s

## 2009-04-10 ENCOUNTER — Ambulatory Visit: Payer: Self-pay | Admitting: Internal Medicine

## 2009-04-15 ENCOUNTER — Ambulatory Visit: Payer: Self-pay | Admitting: Internal Medicine

## 2009-04-15 DIAGNOSIS — I272 Pulmonary hypertension, unspecified: Secondary | ICD-10-CM | POA: Insufficient documentation

## 2009-04-15 DIAGNOSIS — I509 Heart failure, unspecified: Secondary | ICD-10-CM | POA: Insufficient documentation

## 2009-04-16 ENCOUNTER — Encounter: Payer: Self-pay | Admitting: *Deleted

## 2009-05-05 ENCOUNTER — Ambulatory Visit: Payer: Self-pay | Admitting: Cardiology

## 2009-05-05 LAB — CONVERTED CEMR LAB
POC INR: 2.1
Prothrombin Time: 17.8 s

## 2009-06-02 ENCOUNTER — Ambulatory Visit: Payer: Self-pay | Admitting: Cardiovascular Disease

## 2009-06-02 LAB — CONVERTED CEMR LAB: POC INR: 2.8

## 2009-06-30 ENCOUNTER — Ambulatory Visit: Payer: Self-pay | Admitting: Cardiology

## 2009-06-30 LAB — CONVERTED CEMR LAB: POC INR: 2.4

## 2009-07-11 ENCOUNTER — Ambulatory Visit: Payer: Self-pay | Admitting: Internal Medicine

## 2009-07-11 LAB — CONVERTED CEMR LAB
Creatinine,U: 85.5 mg/dL
Microalb Creat Ratio: 35.1 mg/g — ABNORMAL HIGH (ref 0.0–30.0)
Microalb, Ur: 3 mg/dL — ABNORMAL HIGH (ref 0.0–1.9)

## 2009-07-14 ENCOUNTER — Ambulatory Visit: Payer: Self-pay | Admitting: Internal Medicine

## 2009-07-14 LAB — CONVERTED CEMR LAB: POC INR: 1.9

## 2009-07-17 LAB — CONVERTED CEMR LAB
ALT: 26 units/L (ref 0–53)
AST: 21 units/L (ref 0–37)
Cholesterol: 157 mg/dL (ref 0–200)
HDL: 38.8 mg/dL — ABNORMAL LOW (ref >39.00)
Hgb A1c MFr Bld: 6.5 % (ref 4.6–6.5)
LDL Cholesterol: 84 mg/dL (ref 0–99)
Total CHOL/HDL Ratio: 4
Triglycerides: 169 mg/dL — ABNORMAL HIGH (ref 0.0–149.0)
VLDL: 33.8 mg/dL (ref 0.0–40.0)

## 2009-08-05 ENCOUNTER — Ambulatory Visit: Payer: Self-pay | Admitting: Cardiovascular Disease

## 2009-08-05 LAB — CONVERTED CEMR LAB: POC INR: 2.5

## 2009-09-02 ENCOUNTER — Ambulatory Visit: Payer: Self-pay | Admitting: Cardiovascular Disease

## 2009-09-02 LAB — CONVERTED CEMR LAB: POC INR: 2.2

## 2009-09-23 ENCOUNTER — Ambulatory Visit: Payer: Self-pay | Admitting: Cardiology

## 2009-09-23 LAB — CONVERTED CEMR LAB
INR: 2
POC INR: 2

## 2009-09-30 ENCOUNTER — Ambulatory Visit: Payer: Self-pay | Admitting: Internal Medicine

## 2009-10-07 ENCOUNTER — Telehealth: Payer: Self-pay | Admitting: Internal Medicine

## 2009-10-23 ENCOUNTER — Ambulatory Visit: Payer: Self-pay | Admitting: Internal Medicine

## 2009-10-23 LAB — CONVERTED CEMR LAB: POC INR: 2.1

## 2009-10-24 ENCOUNTER — Telehealth: Payer: Self-pay | Admitting: Internal Medicine

## 2009-10-24 ENCOUNTER — Encounter: Payer: Self-pay | Admitting: Internal Medicine

## 2009-10-29 ENCOUNTER — Telehealth: Payer: Self-pay | Admitting: Internal Medicine

## 2009-11-11 ENCOUNTER — Telehealth: Payer: Self-pay | Admitting: Internal Medicine

## 2009-11-11 ENCOUNTER — Ambulatory Visit: Payer: Self-pay | Admitting: Internal Medicine

## 2009-11-11 DIAGNOSIS — J45909 Unspecified asthma, uncomplicated: Secondary | ICD-10-CM | POA: Insufficient documentation

## 2009-11-17 ENCOUNTER — Telehealth: Payer: Self-pay | Admitting: Internal Medicine

## 2009-11-17 LAB — CONVERTED CEMR LAB
ALT: 21 units/L (ref 0–53)
AST: 23 units/L (ref 0–37)
BUN: 12 mg/dL (ref 6–23)
Basophils Absolute: 0 10*3/uL (ref 0.0–0.1)
Basophils Relative: 0.1 % (ref 0.0–3.0)
CO2: 30 meq/L (ref 19–32)
Calcium: 8.8 mg/dL (ref 8.4–10.5)
Chloride: 103 meq/L (ref 96–112)
Creatinine, Ser: 1.1 mg/dL (ref 0.4–1.5)
Eosinophils Absolute: 0.2 10*3/uL (ref 0.0–0.7)
Eosinophils Relative: 2.2 % (ref 0.0–5.0)
GFR calc non Af Amer: 70.6 mL/min (ref >60)
Glucose, Bld: 126 mg/dL — ABNORMAL HIGH (ref 70–99)
HCT: 45.1 % (ref 39.0–52.0)
Hemoglobin: 15.2 g/dL (ref 13.0–17.0)
Hgb A1c MFr Bld: 6.9 % — ABNORMAL HIGH (ref 4.6–6.5)
Lymphocytes Relative: 14.7 % (ref 12.0–46.0)
Lymphs Abs: 1.1 10*3/uL (ref 0.7–4.0)
MCHC: 33.6 g/dL (ref 30.0–36.0)
MCV: 91.9 fL (ref 78.0–100.0)
Monocytes Absolute: 1.2 10*3/uL — ABNORMAL HIGH (ref 0.1–1.0)
Monocytes Relative: 16.4 % — ABNORMAL HIGH (ref 3.0–12.0)
Neutro Abs: 5.1 10*3/uL (ref 1.4–7.7)
Neutrophils Relative %: 66.6 % (ref 43.0–77.0)
PSA: 5.15 ng/mL — ABNORMAL HIGH (ref 0.10–4.00)
Platelets: 221 10*3/uL (ref 150.0–400.0)
Potassium: 4.9 meq/L (ref 3.5–5.1)
RBC: 4.91 M/uL (ref 4.22–5.81)
RDW: 13.2 % (ref 11.5–14.6)
Sodium: 139 meq/L (ref 135–145)
WBC: 7.6 10*3/uL (ref 4.5–10.5)

## 2009-11-20 ENCOUNTER — Ambulatory Visit: Payer: Self-pay | Admitting: Cardiology

## 2009-11-20 LAB — CONVERTED CEMR LAB: POC INR: 2.6

## 2009-12-03 ENCOUNTER — Encounter (INDEPENDENT_AMBULATORY_CARE_PROVIDER_SITE_OTHER): Payer: Self-pay | Admitting: *Deleted

## 2009-12-03 ENCOUNTER — Ambulatory Visit: Payer: Self-pay | Admitting: Internal Medicine

## 2009-12-05 ENCOUNTER — Encounter (INDEPENDENT_AMBULATORY_CARE_PROVIDER_SITE_OTHER): Payer: Self-pay | Admitting: *Deleted

## 2009-12-05 LAB — CONVERTED CEMR LAB: Fecal Occult Bld: NEGATIVE

## 2009-12-17 ENCOUNTER — Ambulatory Visit: Payer: Self-pay | Admitting: Cardiovascular Disease

## 2009-12-17 LAB — CONVERTED CEMR LAB: POC INR: 3.5

## 2010-01-07 ENCOUNTER — Ambulatory Visit: Payer: Self-pay | Admitting: Cardiovascular Disease

## 2010-01-07 LAB — CONVERTED CEMR LAB: POC INR: 3.2

## 2010-01-28 ENCOUNTER — Ambulatory Visit: Payer: Self-pay | Admitting: Cardiology

## 2010-01-28 LAB — CONVERTED CEMR LAB: POC INR: 1.7

## 2010-02-09 ENCOUNTER — Ambulatory Visit: Payer: Self-pay | Admitting: Internal Medicine

## 2010-02-15 LAB — CONVERTED CEMR LAB
ALT: 31 units/L (ref 0–53)
AST: 21 units/L (ref 0–37)
Hgb A1c MFr Bld: 6.3 % (ref 4.6–6.5)

## 2010-02-17 ENCOUNTER — Ambulatory Visit: Payer: Self-pay | Admitting: Cardiovascular Disease

## 2010-02-17 LAB — CONVERTED CEMR LAB: POC INR: 3.2

## 2010-03-10 ENCOUNTER — Ambulatory Visit: Payer: Self-pay | Admitting: Internal Medicine

## 2010-03-10 LAB — CONVERTED CEMR LAB: POC INR: 2.9

## 2010-04-07 ENCOUNTER — Ambulatory Visit: Payer: Self-pay | Admitting: Internal Medicine

## 2010-04-07 LAB — CONVERTED CEMR LAB: POC INR: 2.6

## 2010-04-29 ENCOUNTER — Ambulatory Visit: Payer: Self-pay | Admitting: Internal Medicine

## 2010-05-06 ENCOUNTER — Ambulatory Visit: Payer: Self-pay | Admitting: Cardiology

## 2010-05-06 LAB — CONVERTED CEMR LAB: POC INR: 3.1

## 2010-05-13 ENCOUNTER — Ambulatory Visit: Payer: Self-pay | Admitting: Internal Medicine

## 2010-05-13 DIAGNOSIS — R972 Elevated prostate specific antigen [PSA]: Secondary | ICD-10-CM | POA: Insufficient documentation

## 2010-05-13 LAB — HM DIABETES FOOT EXAM

## 2010-05-18 LAB — CONVERTED CEMR LAB
BUN: 16 mg/dL (ref 6–23)
CO2: 26 meq/L (ref 19–32)
Calcium: 8.9 mg/dL (ref 8.4–10.5)
Chloride: 102 meq/L (ref 96–112)
Cholesterol: 152 mg/dL (ref 0–200)
Creatinine, Ser: 0.7 mg/dL (ref 0.4–1.5)
GFR calc non Af Amer: 120.75 mL/min (ref >60)
Glucose, Bld: 133 mg/dL — ABNORMAL HIGH (ref 70–99)
HDL: 37.6 mg/dL — ABNORMAL LOW (ref >39.00)
Hgb A1c MFr Bld: 6.3 % (ref 4.6–6.5)
LDL Cholesterol: 82 mg/dL (ref 0–99)
PSA: 2.54 ng/mL (ref 0.10–4.00)
Potassium: 4.3 meq/L (ref 3.5–5.1)
Sodium: 141 meq/L (ref 135–145)
Total CHOL/HDL Ratio: 4
Triglycerides: 164 mg/dL — ABNORMAL HIGH (ref 0.0–149.0)
VLDL: 32.8 mg/dL (ref 0.0–40.0)

## 2010-06-03 ENCOUNTER — Ambulatory Visit: Payer: Self-pay | Admitting: Internal Medicine

## 2010-06-03 LAB — CONVERTED CEMR LAB: POC INR: 3

## 2010-07-01 ENCOUNTER — Ambulatory Visit: Payer: Self-pay | Admitting: Cardiology

## 2010-07-01 LAB — CONVERTED CEMR LAB: POC INR: 3.1

## 2010-07-29 ENCOUNTER — Ambulatory Visit: Payer: Self-pay | Admitting: Internal Medicine

## 2010-07-29 LAB — CONVERTED CEMR LAB: POC INR: 2.1

## 2010-08-13 ENCOUNTER — Telehealth (INDEPENDENT_AMBULATORY_CARE_PROVIDER_SITE_OTHER): Payer: Self-pay | Admitting: *Deleted

## 2010-08-21 ENCOUNTER — Ambulatory Visit: Payer: Self-pay | Admitting: Cardiology

## 2010-08-21 LAB — CONVERTED CEMR LAB: POC INR: 2.6

## 2010-09-14 ENCOUNTER — Ambulatory Visit: Payer: Self-pay | Admitting: Internal Medicine

## 2010-09-14 DIAGNOSIS — J9611 Chronic respiratory failure with hypoxia: Secondary | ICD-10-CM

## 2010-09-14 DIAGNOSIS — I8310 Varicose veins of unspecified lower extremity with inflammation: Secondary | ICD-10-CM | POA: Insufficient documentation

## 2010-09-14 DIAGNOSIS — L309 Dermatitis, unspecified: Secondary | ICD-10-CM | POA: Insufficient documentation

## 2010-09-14 HISTORY — DX: Chronic respiratory failure with hypoxia: J96.11

## 2010-09-16 ENCOUNTER — Ambulatory Visit: Payer: Self-pay | Admitting: Internal Medicine

## 2010-09-17 LAB — CONVERTED CEMR LAB
ALT: 23 units/L (ref 0–53)
AST: 21 units/L (ref 0–37)
BUN: 11 mg/dL (ref 6–23)
CO2: 27 meq/L (ref 19–32)
Calcium: 9 mg/dL (ref 8.4–10.5)
Chloride: 102 meq/L (ref 96–112)
Creatinine, Ser: 0.7 mg/dL (ref 0.4–1.5)
GFR calc non Af Amer: 113.03 mL/min (ref >60.00)
Glucose, Bld: 111 mg/dL — ABNORMAL HIGH (ref 70–99)
Hemoglobin: 15.4 g/dL (ref 13.0–17.0)
Hgb A1c MFr Bld: 6.3 % (ref 4.6–6.5)
Potassium: 4.1 meq/L (ref 3.5–5.1)
Sodium: 141 meq/L (ref 135–145)
TSH: 1.56 microintl units/mL (ref 0.35–5.50)

## 2010-09-18 ENCOUNTER — Ambulatory Visit: Payer: Self-pay | Admitting: Internal Medicine

## 2010-09-18 LAB — CONVERTED CEMR LAB: POC INR: 2.8

## 2010-09-25 ENCOUNTER — Telehealth: Payer: Self-pay | Admitting: Internal Medicine

## 2010-09-28 ENCOUNTER — Ambulatory Visit: Payer: Self-pay | Admitting: Internal Medicine

## 2010-10-04 ENCOUNTER — Encounter: Payer: Self-pay | Admitting: Internal Medicine

## 2010-10-08 ENCOUNTER — Ambulatory Visit: Payer: Self-pay | Admitting: Internal Medicine

## 2010-10-08 ENCOUNTER — Encounter: Payer: Self-pay | Admitting: Internal Medicine

## 2010-10-08 DIAGNOSIS — L97909 Non-pressure chronic ulcer of unspecified part of unspecified lower leg with unspecified severity: Secondary | ICD-10-CM | POA: Insufficient documentation

## 2010-10-11 DIAGNOSIS — R9389 Abnormal findings on diagnostic imaging of other specified body structures: Secondary | ICD-10-CM

## 2010-10-11 HISTORY — DX: Abnormal findings on diagnostic imaging of other specified body structures: R93.89

## 2010-10-16 ENCOUNTER — Ambulatory Visit: Admission: RE | Admit: 2010-10-16 | Discharge: 2010-10-16 | Payer: Self-pay | Source: Home / Self Care

## 2010-10-16 ENCOUNTER — Encounter: Payer: Self-pay | Admitting: Internal Medicine

## 2010-10-16 LAB — CONVERTED CEMR LAB
INR: 2.4
POC INR: 2.4

## 2010-10-19 ENCOUNTER — Encounter: Payer: Self-pay | Admitting: Internal Medicine

## 2010-10-19 ENCOUNTER — Ambulatory Visit: Admission: RE | Admit: 2010-10-19 | Discharge: 2010-10-19 | Payer: Self-pay | Source: Home / Self Care

## 2010-10-21 ENCOUNTER — Encounter: Payer: Self-pay | Admitting: Internal Medicine

## 2010-10-21 ENCOUNTER — Ambulatory Visit: Admission: RE | Admit: 2010-10-21 | Discharge: 2010-10-21 | Payer: Self-pay | Source: Home / Self Care

## 2010-10-22 ENCOUNTER — Ambulatory Visit
Admission: RE | Admit: 2010-10-22 | Discharge: 2010-10-22 | Payer: Self-pay | Source: Home / Self Care | Attending: Cardiovascular Disease | Admitting: Cardiovascular Disease

## 2010-10-22 DIAGNOSIS — I739 Peripheral vascular disease, unspecified: Secondary | ICD-10-CM | POA: Insufficient documentation

## 2010-11-03 ENCOUNTER — Ambulatory Visit: Admit: 2010-11-03 | Payer: Self-pay | Admitting: Vascular Surgery

## 2010-11-03 ENCOUNTER — Encounter: Payer: Self-pay | Admitting: Internal Medicine

## 2010-11-03 ENCOUNTER — Ambulatory Visit
Admission: RE | Admit: 2010-11-03 | Discharge: 2010-11-03 | Payer: Self-pay | Source: Home / Self Care | Attending: Surgery | Admitting: Surgery

## 2010-11-04 NOTE — Consult Note (Signed)
NEW PATIENT CONSULTATION  Bradley [name], Bradley [name] DOB:  1941-02-04                                       11/03/2010 UEAVW#:[id number]  The patient presents today for evaluation of lower extremity venous hypertension.  He is a 70 year old gentleman with a history of coronary artery disease, chronic atrial fibrillation, hypertension, hyperlipidemia, and diabetes.  He has ulcerations over the pretibial areas bilaterally with evidence of chronic venous stasis.  He had been seen by a dermatologist who had prescribed topical ointment and graduated compression garments.  He had difficulty with the compression garments actually tearing the skin and causing more ulceration.Marland Kitchen  He is on chronic Coumadin therapy due to his chronic atrial fibrillation.  He does not have any history of DVT.  SOCIAL HISTORY:  He is married with one child.  He is retired.  He quit smoking 30 years.  He does drink alcohol on a regular basis.  FAMILY HISTORY:  Positive for mother who died at age 4 of polycythemia. Otherwise no premature atherosclerotic disease.  REVIEW OF SYSTEMS:  He weighs 230 pounds with no weight loss or gain. He is 5 feet 2 inches tall. HEART:  Positive for cardiac murmur. SKIN:  Positive for ulceration. Other review of systems otherwise negative except as in HPI.  HISTORY OF PRESENT ILLNESS:  General:  Well developed, well nourished white male appearing stated age in no acute distress.  Blood pressure is 175/83, pulse of 79, respirations 18.  HEENT:  Normal.  Chest:  Clear bilaterally.  Heart:  Regular rate and rhythm.  Abdomen:  Soft. Moderately obese, nontender.  Musculoskeletal:  Shows no major deformities or cyanosis.  Neurologic:  No focal weakness or paresthesias.  Skin:  Does have pretibial ulcers bilaterally and does have edema bilaterally more so on the right than on the left.  He does have chronic  hemosiderin deposits bilaterally.  This is quite extensive from his  calves distally.  He underwent noninvasive vascular laboratory studies inn our office.  This revealed reflux in his  left great saphenous vein.  No significant reflux in his  right great saphenous vein.  He does not have any evidence of DVT in the lower extremities bilaterally.  I had a long discussion with this patient and his wife present.  I explained that the only treatment option that I could offer would be ablation of his left great saphenous vein.  He does have worsened swelling and symptoms on his right versus left and therefore  I certainly would reserve this.  I did explain the critical importance of compression and instructed him on the use of Ace wraps until his ulcerations are healed at which time, he will switch to compression garments.  He will see [place] again on an as-needed basis.    Larina Earthly, M.D. Electronically Signed  TFE/MEDQ  D:  11/03/2010  T:  11/04/2010  Job:  [id number]  cc:   [name], MD Doylene Canning. Ladona Ridgel, MD

## 2010-11-06 NOTE — Procedures (Unsigned)
LOWER EXTREMITY VENOUS REFLUX EXAM  INDICATION:  Ulcers.  EXAM:  Using color-flow imaging and pulse Doppler spectral analysis, the right and left common femoral, superficial femoral, popliteal, posterior tibial, greater and lesser saphenous veins were evaluated.  There is no evidence suggesting deep venous insufficiency in the right and left lower extremity.  The right and left saphenofemoral junction is not competent with reflux of >500 milliseconds.  The right GSV is competent.  The left GSV is not competent with reflux of >500 milliseconds with the caliber as described below.  The right and left proximal short saphenous vein demonstrates competency.  GSV Diameter (used if found to be incompetent only)                                           Right    Left Proximal Greater Saphenous Vein           cm       0.49 cm Proximal-to-mid-thigh                     cm       0.63 cm Mid thigh                                 cm       0.37 cm Mid-distal thigh                          cm       cm Distal thigh                              cm       0.30 cm Knee                                      cm       cm  IMPRESSION:  Right greater saphenous vein is competent.  The left greater saphenous vein is not competent with reflux of >500 milliseconds.  The right and left greater saphenous vein is not tortuous.  The deep venous system is competent.  The right and left short saphenous vein is competent.  ___________________________________________ Larina Earthly, M.D.  OD/MEDQ  D:  11/03/2010  T:  11/03/2010  Job:  [id number]

## 2010-11-08 LAB — CONVERTED CEMR LAB
ALT: 34 units/L (ref 0–40)
AST: 20 units/L (ref 0–37)
BUN: 12 mg/dL (ref 6–23)
BUN: 16 mg/dL (ref 6–23)
Basophils Absolute: 0.1 10*3/uL (ref 0.0–0.1)
Basophils Relative: 0.5 % (ref 0.0–1.0)
CO2: 29 meq/L (ref 19–32)
CO2: 29 meq/L (ref 19–32)
Calcium: 9.4 mg/dL (ref 8.4–10.5)
Calcium: 9.7 mg/dL (ref 8.4–10.5)
Chloride: 102 meq/L (ref 96–112)
Chloride: 99 meq/L (ref 96–112)
Creatinine, Ser: 0.7 mg/dL (ref 0.4–1.5)
Creatinine, Ser: 0.8 mg/dL (ref 0.4–1.5)
Eosinophils Absolute: 0 10*3/uL (ref 0.0–0.6)
Eosinophils Relative: 0.1 % (ref 0.0–5.0)
GFR calc Af Amer: 124 mL/min
GFR calc non Af Amer: 103 mL/min
GFR calc non Af Amer: 120 mL/min
Glomerular Filtration Rate, Af Am: 146 mL/min/{1.73_m2}
Glucose, Bld: 118 mg/dL — ABNORMAL HIGH (ref 70–99)
Glucose, Bld: 321 mg/dL — ABNORMAL HIGH (ref 70–99)
HCT: 43.8 % (ref 39.0–52.0)
HCT: 44.3 % (ref 39.0–52.0)
Hemoglobin: 14.8 g/dL (ref 13.0–17.0)
Hemoglobin: 14.9 g/dL (ref 13.0–17.0)
Hgb A1c MFr Bld: 8.3 % — ABNORMAL HIGH (ref 4.6–6.0)
Lymphocytes Relative: 7.2 % — ABNORMAL LOW (ref 12.0–46.0)
MCHC: 33.7 g/dL (ref 30.0–36.0)
MCHC: 33.9 g/dL (ref 30.0–36.0)
MCV: 89 fL (ref 78.0–100.0)
MCV: 90.7 fL (ref 78.0–100.0)
Monocytes Absolute: 0.7 10*3/uL (ref 0.2–0.7)
Monocytes Relative: 4.9 % (ref 3.0–11.0)
Neutro Abs: 13.3 10*3/uL — ABNORMAL HIGH (ref 1.4–7.7)
Neutrophils Relative %: 87.3 % — ABNORMAL HIGH (ref 43.0–77.0)
PSA: 3.38 ng/mL (ref 0.10–4.00)
Platelets: 225 10*3/uL (ref 150–400)
Platelets: 275 10*3/uL (ref 150–400)
Potassium: 4 meq/L (ref 3.5–5.1)
Potassium: 4.8 meq/L (ref 3.5–5.1)
Pro B Natriuretic peptide (BNP): 149 pg/mL — ABNORMAL HIGH (ref 0.0–100.0)
RBC: 4.88 M/uL (ref 4.22–5.81)
RBC: 4.92 M/uL (ref 4.22–5.81)
RDW: 13.4 % (ref 11.5–14.6)
RDW: 13.8 % (ref 11.5–14.6)
Sodium: 135 meq/L (ref 135–145)
Sodium: 141 meq/L (ref 135–145)
TSH: 0.97 microintl units/mL (ref 0.35–5.50)
WBC: 15.2 10*3/uL — ABNORMAL HIGH (ref 4.5–10.5)
WBC: 6.9 10*3/uL (ref 4.5–10.5)

## 2010-11-10 NOTE — Medication Information (Signed)
Summary: rov/cb  Anticoagulant Therapy  Managed by: Bethena Midget, RN, BSN Referring MD: Graceann Congress MD PCP: Nolon Rod. Paz MD Supervising MD: Daleen Squibb MD, Maisie Fus Indication 1: Atrial Fibrillation (ICD-427.31) Lab Used: [facility] Site: [facility] INR POC 3.1 INR RANGE 2 - 3  Dietary changes: no    Health status changes: yes       Details: Had allergic reaction to act-on topical analgesic, neck became swollen and redden. Saw Dr Drue Novel  Bleeding/hemorrhagic complications: no    Recent/future hospitalizations: no    Any changes in medication regimen? yes       Details: Keflex 500mg s Qhrs for 5 days and Hydrocortisone cream 2.5% per Dr Drue Novel  Recent/future dental: no  Any missed doses?: no       Is patient compliant with meds? yes       Allergies: 1)  ! * Cialis  Anticoagulation Management History:      The patient is taking warfarin and comes in today for a routine follow up visit.  Positive risk factors for bleeding include an age of 70 years or older and presence of serious comorbidities.  The bleeding index is 'intermediate risk'.  Positive CHADS2 values include History of CHF, History of HTN, and History of Diabetes.  Negative CHADS2 values include Age > 70 years old.  The start date was 12/03/2003.  His last INR was 2.0.  Anticoagulation responsible provider: Daleen Squibb MD, Maisie Fus.  INR POC: 3.1.  Cuvette Lot#: [id number].  Exp: 07/2011.    Anticoagulation Management Assessment/Plan:      The patient's current anticoagulation dose is Coumadin 5 mg tabs: Take as directed by coumadin clinic..  The target INR is 2 - 3.  The next INR is due 06/03/2010.  Anticoagulation instructions were given to patient.  Results were reviewed/authorized by Bethena Midget, RN, BSN.  He was notified by Bethena Midget, RN, BSN.         Prior Anticoagulation Instructions: INR 2.6. Take 1 tablet daily. Recheck in 4 weeks.  Current Anticoagulation Instructions: INR 3.1 Today 2.5mg s then resume 5mg s everyday.  Recheck in 4 weeks.

## 2010-11-10 NOTE — Medication Information (Signed)
Summary: rov/sp  Anticoagulant Therapy  Managed by: Bethena Midget, RN, BSN Referring MD: Graceann Congress MD PCP: Nolon Rod. Paz MD Supervising MD: Eden Emms MD, Theron Arista Indication 1: Atrial Fibrillation (ICD-427.31) Lab Used: [facility] Site: [facility] INR POC 3.2 INR RANGE 2 - 3  Dietary changes: no    Health status changes: no    Bleeding/hemorrhagic complications: no    Recent/future hospitalizations: no    Any changes in medication regimen? no    Recent/future dental: no  Any missed doses?: no       Is patient compliant with meds? yes       Allergies: 1)  ! * Cialis  Anticoagulation Management History:      The patient is taking warfarin and comes in today for a routine follow up visit.  Positive risk factors for bleeding include an age of 70 years or older and presence of serious comorbidities.  The bleeding index is 'intermediate risk'.  Positive CHADS2 values include History of CHF, History of HTN, and History of Diabetes.  Negative CHADS2 values include Age > 70 years old.  The start date was 12/03/2003.  His last INR was 2.0.  Anticoagulation responsible provider: Eden Emms MD, Theron Arista.  INR POC: 3.2.  Cuvette Lot#: [id number].  Exp: 05/2011.    Anticoagulation Management Assessment/Plan:      The patient's current anticoagulation dose is Coumadin 5 mg tabs: Take as directed by coumadin clinic..  The target INR is 2 - 3.  The next INR is due 03/10/2010.  Anticoagulation instructions were given to patient.  Results were reviewed/authorized by Bethena Midget, RN, BSN.  He was notified by Bethena Midget, RN, BSN.         Prior Anticoagulation Instructions: INR 1.7  Increase dose to 1 tablet every day   Current Anticoagulation Instructions: INR 3.2 Skip today's dose then resume 5mg  daily. Recheck in 2-3 weeks.

## 2010-11-10 NOTE — Medication Information (Signed)
Summary: rov/ewj  Anticoagulant Therapy  Managed by: Cloyde Reams, RN, BSN Referring MD: Graceann Congress MD PCP: Nolon Rod. Paz MD Supervising MD: Eden Emms MD, Theron Arista Indication 1: Atrial Fibrillation (ICD-427.31) Lab Used: [facility] Site: [facility] INR POC 3.2 INR RANGE 2 - 3  Dietary changes: no    Health status changes: no    Bleeding/hemorrhagic complications: no    Recent/future hospitalizations: no    Any changes in medication regimen? no    Recent/future dental: no  Any missed doses?: no       Is patient compliant with meds? yes       Allergies (verified): 1)  ! * Cialis  Anticoagulation Management History:      The patient is taking warfarin and comes in today for a routine follow up visit.  Positive risk factors for bleeding include an age of 3 years or older and presence of serious comorbidities.  The bleeding index is 'intermediate risk'.  Positive CHADS2 values include History of CHF, History of HTN, and History of Diabetes.  Negative CHADS2 values include Age > 2 years old.  The start date was 12/03/2003.  His last INR was 2.0.  Anticoagulation responsible provider: Eden Emms MD, Theron Arista.  INR POC: 3.2.  Cuvette Lot#: [id number].  Exp: 02/2011.    Anticoagulation Management Assessment/Plan:      The patient's current anticoagulation dose is Coumadin 5 mg tabs: Take as directed by coumadin clinic..  The target INR is 2 - 3.  The next INR is due 01/28/2010.  Anticoagulation instructions were given to patient.  Results were reviewed/authorized by Cloyde Reams, RN, BSN.  He was notified by Cloyde Reams RN.         Prior Anticoagulation Instructions: INR 3.5 Skip today then resume 5mg s daily. Recheck in 3 weeks.   Current Anticoagulation Instructions: INR 3.2  Start taking 1 tablet daily except 1/2 tablet on Wednesdays.  Recheck in 3 weeks.

## 2010-11-10 NOTE — Medication Information (Signed)
Summary: rov/sl   Anticoagulant Therapy  Managed by: Weston Brass, PharmD Referring MD: Ladona Ridgel PCP: Nolon Rod. Paz MD Supervising MD: Johney Frame MD, Fayrene Fearing Indication 1: Atrial Fibrillation (ICD-427.31) Lab Used: [facility] Site: [facility] INR POC 2.8 INR RANGE 2 - 3  Dietary changes: no    Health status changes: yes       Details: recent cxr showed nodule on RLL.  Having CT to evaluate  Bleeding/hemorrhagic complications: no    Recent/future hospitalizations: no    Any changes in medication regimen? no    Recent/future dental: no  Any missed doses?: no       Is patient compliant with meds? yes       Allergies: 1)  ! * Cialis  Anticoagulation Management History:      The patient is taking warfarin and comes in today for a routine follow up visit.  Positive risk factors for bleeding include an age of 70 years or older and presence of serious comorbidities.  The bleeding index is 'intermediate risk'.  Positive CHADS2 values include History of CHF, History of HTN, and History of Diabetes.  Negative CHADS2 values include Age > 77 years old.  The start date was 12/03/2003.  His last INR was 2.0.  Anticoagulation responsible provider: Allred MD, Fayrene Fearing.  INR POC: 2.8.  Cuvette Lot#: [id number].  Exp: 11/2011.    Anticoagulation Management Assessment/Plan:      The patient's current anticoagulation dose is Coumadin 5 mg tabs: Take as directed by coumadin clinic..  The target INR is 2 - 3.  The next INR is due 10/16/2010.  Anticoagulation instructions were given to patient.  Results were reviewed/authorized by Weston Brass, PharmD.  He was notified by Weston Brass PharmD.         Prior Anticoagulation Instructions: INR 2.6  Continue taking Coumadin 1 tab (5 mg) every day. Return to clinic in 4 weeks.   Current Anticoagulation Instructions: INR 2.8  Continue same dose of 1 tablet every day.  Recheck INR in 4 weeks.

## 2010-11-10 NOTE — Medication Information (Signed)
Summary: rov/tm  Anticoagulant Therapy  Managed by: Bethena Midget, RN, BSN Referring MD: Graceann Congress MD PCP: Nolon Rod. Paz MD Supervising MD: Eden Emms MD, Theron Arista Indication 1: Atrial Fibrillation (ICD-427.31) Lab Used: [facility] Site: [facility] INR POC 3.5 INR RANGE 2 - 3  Dietary changes: no    Health status changes: no    Bleeding/hemorrhagic complications: no    Recent/future hospitalizations: no    Any changes in medication regimen? no    Recent/future dental: no  Any missed doses?: no       Is patient compliant with meds? yes       Allergies: 1)  ! * Cialis  Anticoagulation Management History:      The patient is taking warfarin and comes in today for a routine follow up visit.  Positive risk factors for bleeding include an age of 70 years or older and presence of serious comorbidities.  The bleeding index is 'intermediate risk'.  Positive CHADS2 values include History of CHF, History of HTN, and History of Diabetes.  Negative CHADS2 values include Age > 32 years old.  The start date was 12/03/2003.  His last INR was 2.0.  Anticoagulation responsible provider: Eden Emms MD, Theron Arista.  INR POC: 3.5.  Cuvette Lot#: [id number].  Exp: 02/2011.    Anticoagulation Management Assessment/Plan:      The patient's current anticoagulation dose is Coumadin 5 mg tabs: Take as directed by coumadin clinic..  The target INR is 2 - 3.  The next INR is due 01/07/2010.  Anticoagulation instructions were given to patient.  Results were reviewed/authorized by Bethena Midget, RN, BSN.  He was notified by Bethena Midget, RN, BSN.         Prior Anticoagulation Instructions: INR 2.6 Continue 5mg s everyday. Recheck in 4 weeks.   Current Anticoagulation Instructions: INR 3.5 Skip today then resume 5mg s daily. Recheck in 3 weeks.

## 2010-11-10 NOTE — Medication Information (Signed)
Summary: ROV COUMADIN - [facility]  Anticoagulant Therapy  Managed by: Bethena Midget, RN, BSN Referring MD: Graceann Congress MD PCP: Nolon Rod. Paz MD Supervising MD: Daleen Squibb MD, Maisie Fus Indication 1: Atrial Fibrillation (ICD-427.31) Lab Used: [facility] Site: [facility] INR POC 2.6 INR RANGE 2 - 3  Dietary changes: no    Health status changes: no    Bleeding/hemorrhagic complications: no    Recent/future hospitalizations: no    Any changes in medication regimen? yes       Details: Took Cefprozil BID x 7days. finished last week.   Recent/future dental: no  Any missed doses?: no       Is patient compliant with meds? yes       Allergies: 1)  ! * Cialis  Anticoagulation Management History:      The patient is taking warfarin and comes in today for a routine follow up visit.  Positive risk factors for bleeding include an age of 35 years or older and presence of serious comorbidities.  The bleeding index is 'intermediate risk'.  Positive CHADS2 values include History of CHF, History of HTN, and History of Diabetes.  Negative CHADS2 values include Age > 43 years old.  The start date was 12/03/2003.  His last INR was 2.0.  Anticoagulation responsible provider: Daleen Squibb MD, Maisie Fus.  INR POC: 2.6.  Cuvette Lot#: [id number].  Exp: 02/08/2011.    Anticoagulation Management Assessment/Plan:      The patient's current anticoagulation dose is Coumadin 5 mg tabs: Take as directed by coumadin clinic..  The target INR is 2 - 3.  The next INR is due 12/18/2009.  Anticoagulation instructions were given to patient.  Results were reviewed/authorized by Bethena Midget, RN, BSN.  He was notified by Bethena Midget, RN, BSN.         Prior Anticoagulation Instructions: INR 2.1  Continue Coumadin 1 tab = 5mg  each day  Current Anticoagulation Instructions: INR 2.6 Continue 5mg s everyday. Recheck in 4 weeks.

## 2010-11-10 NOTE — Progress Notes (Signed)
Summary: calling to check on pt  ---- Converted from flag ---- ---- 10/23/2009 5:33 PM, Monico Sudduth E. Simaya Lumadue MD wrote: please check on him ------------------------------ Spoke with wife - pt is feeling better.  Advised to call with any questions or concerns Shary Decamp  October 29, 2009 2:20 PM

## 2010-11-10 NOTE — Medication Information (Signed)
Summary: rov/jm  Anticoagulant Therapy  Managed by: Leota Sauers, PharmD, BCPS, CPP Referring MD: Graceann Congress MD PCP: Nolon Rod. Paz MD Supervising MD: Ladona Ridgel MD, Sharlot Gowda Indication 1: Atrial Fibrillation (ICD-427.31) Lab Used: [facility] Site: [facility] INR POC 2.1 INR RANGE 2 - 3  Dietary changes: no    Health status changes: no    Bleeding/hemorrhagic complications: no    Recent/future hospitalizations: no    Any changes in medication regimen? yes       Details: doxy day 1/10  Recent/future dental: no  Any missed doses?: no       Is patient compliant with meds? yes       Current Medications (verified): 1)  Glyburide 5 Mg Tabs (Glyburide) .... 4 By Mouth Once Daily 2)  Glucophage 500 Mg  Tabs (Metformin Hcl) .Marland Kitchen.. 1 By Mouth Two Times A Day 3)  Januvia 100 Mg Tabs (Sitagliptin Phosphate) .Marland Kitchen.. 1 By Mouth Qd 4)  Metoprolol Tartrate 25 Mg  Tabs (Metoprolol Tartrate) .Marland Kitchen.. 1 By Mouth Bid 5)  Cozaar 100 Mg Tabs (Losartan Potassium) .... Take 1 Tablet By Mouth Once A Day 6)  Lotrel 10-40 Mg Caps (Amlodipine Besy-Benazepril Hcl) .Marland Kitchen.. 1 By Mouth Qd 7)  Cartia Xt 240 Mg Cp24 (Diltiazem Hcl Coated Beads) .... Take 1 Capsule By Mouth Once A Day 8)  Furosemide 20 Mg Tabs (Furosemide) .... Take 1-2 Tablet By Mouth Once A Day 9)  Digoxin 0.25 Mg Tabs (Digoxin) .... Take 1 Tablet By Mouth Once A Day 10)  Advicor 1000-20 Mg Tb24 (Niacin-Lovastatin) .... Take 1 Tablet By Mouth Once A Day 11)  Flomax 0.4 Mg Cp24 (Tamsulosin Hcl) .Marland Kitchen.. 1 Capsule Once A Day 12)  Ambien 10 Mg  Tabs (Zolpidem Tartrate) .... One P.o. Nightly P.r.n. Insomnia 13)  Aspirin 325 Mg Tabs (Aspirin) .Marland Kitchen.. 1 Tab By Mouth Once Daily 14)  Multivitamins  Tabs (Multiple Vitamin) .Marland Kitchen.. 1 Tab By Mouth Once Daily 15)  Mucinex 600 Mg Tb12 (Guaifenesin) .... Prn 16)  Ascensia Contour Test Strips .... Checks Blood Sugar Two Times A Day Dx 250.00 17)  Coumadin 5 Mg Tabs (Warfarin Sodium) .... Take As Directed By Coumadin  Clinic. 18)  Ketoconazole 2 % Crea (Ketoconazole) .... Apply Two Times A Day Betwen The Toes X 10 Days (Both Foot) 19)  Doxycycline Hyclate 100 Mg Caps (Doxycycline Hyclate) .Marland Kitchen.. 1 By Mouth Two Times A Day  Allergies (verified): 1)  ! * Cialis  Anticoagulation Management History:      The patient is taking warfarin and comes in today for a routine follow up visit.  Positive risk factors for bleeding include an age of 70 years or older and presence of serious comorbidities.  The bleeding index is 'intermediate risk'.  Positive CHADS2 values include History of CHF, History of HTN, and History of Diabetes.  Negative CHADS2 values include Age > 70 years old.  The start date was 12/03/2003.  His last INR was 2.0.  Anticoagulation responsible provider: Ladona Ridgel MD, Sharlot Gowda.  INR POC: 2.1.  Cuvette Lot#: [id number].  Exp: 02/08/2011.    Anticoagulation Management Assessment/Plan:      The patient's current anticoagulation dose is Coumadin 5 mg tabs: Take as directed by coumadin clinic..  The target INR is 2 - 3.  The next INR is due 11/20/2009.  Anticoagulation instructions were given to patient.  Results were reviewed/authorized by Leota Sauers, PharmD, BCPS, CPP.         Prior Anticoagulation Instructions: INR 2.0  CONTINUE  TO TAKE 1 TABLET EVERYDAY.  RECHECK IN 4 WEEKS  Current Anticoagulation Instructions: INR 2.1  Continue Coumadin 1 tab = 5mg  each day

## 2010-11-10 NOTE — Medication Information (Signed)
Summary: rov/tm  Anticoagulant Therapy  Managed by: Weston Brass, PharmD Referring MD: Graceann Congress MD PCP: Nolon Rod. Paz MD Supervising MD: Johney Frame MD, Fayrene Fearing Indication 1: Atrial Fibrillation (ICD-427.31) Lab Used: [facility] Site: [facility] INR POC 3.0 INR RANGE 2 - 3  Dietary changes: no    Health status changes: no    Bleeding/hemorrhagic complications: no    Recent/future hospitalizations: no    Any changes in medication regimen? no    Recent/future dental: no  Any missed doses?: no       Is patient compliant with meds? yes       Allergies: 1)  ! * Cialis  Anticoagulation Management History:      The patient is taking warfarin and comes in today for a routine follow up visit.  Positive risk factors for bleeding include an age of 70 years or older and presence of serious comorbidities.  The bleeding index is 'intermediate risk'.  Positive CHADS2 values include History of CHF, History of HTN, and History of Diabetes.  Negative CHADS2 values include Age > 46 years old.  The start date was 12/03/2003.  His last INR was 2.0.  Anticoagulation responsible Joei Frangos: Allred MD, Fayrene Fearing.  INR POC: 3.0.  Exp: 07/2011.    Anticoagulation Management Assessment/Plan:      The patient's current anticoagulation dose is Coumadin 5 mg tabs: Take as directed by coumadin clinic..  The target INR is 2 - 3.  The next INR is due 07/01/2010.  Anticoagulation instructions were given to patient.  Results were reviewed/authorized by Weston Brass, PharmD.  He was notified by Weston Brass PharmD.         Prior Anticoagulation Instructions: INR 3.1 Today 2.5mg s then resume 5mg s everyday. Recheck in 4 weeks.   Current Anticoagulation Instructions: INR 3.0  Continue same dose of 1 tablet every day.  Recheck INR in 4 weeks.

## 2010-11-10 NOTE — Medication Information (Signed)
Summary: rov/sp  Anticoagulant Therapy  Managed by: Elaina Pattee, PharmD Referring MD: Graceann Congress MD PCP: Nolon Rod. Paz MD Supervising MD: Gala Romney MD, Reuel Boom Indication 1: Atrial Fibrillation (ICD-427.31) Lab Used: [facility] Site: [facility] INR POC 2.6 INR RANGE 2 - 3  Dietary changes: no    Health status changes: no    Bleeding/hemorrhagic complications: no    Recent/future hospitalizations: no    Any changes in medication regimen? no    Recent/future dental: no  Any missed doses?: yes     Details: Missed 2 doses at the beginning of June.  Is patient compliant with meds? yes       Allergies: 1)  ! * Cialis  Anticoagulation Management History:      The patient is taking warfarin and comes in today for a routine follow up visit.  Positive risk factors for bleeding include an age of 27 years or older and presence of serious comorbidities.  The bleeding index is 'intermediate risk'.  Positive CHADS2 values include History of CHF, History of HTN, and History of Diabetes.  Negative CHADS2 values include Age > 21 years old.  The start date was 12/03/2003.  His last INR was 2.0.  Anticoagulation responsible provider: Kristian Hazzard MD, Reuel Boom.  INR POC: 2.6.  Cuvette Lot#: [id number].  Exp: 05/2011.    Anticoagulation Management Assessment/Plan:      The patient's current anticoagulation dose is Coumadin 5 mg tabs: Take as directed by coumadin clinic..  The target INR is 2 - 3.  The next INR is due 05/06/2010.  Anticoagulation instructions were given to patient.  Results were reviewed/authorized by Elaina Pattee, PharmD.  He was notified by Elaina Pattee, PharmD.         Prior Anticoagulation Instructions: INR 2.9  Continue same dose of 1 tablet every day  Current Anticoagulation Instructions: INR 2.6. Take 1 tablet daily. Recheck in 4 weeks.

## 2010-11-10 NOTE — Letter (Signed)
Summary: Results Follow up Letter  [place] [facility]  [address] [place], [place] [address]   Phone: [phone]  Fax: [phone]    12/05/2009 MRN: [id number]  [facility] [address] [place], [place]  [address]  Dear Mr. Holladay,  The following are the results of your recent test(s):  Test         Result    Pap Smear:        Normal _____  Not Normal _____ Comments: ______________________________________________________ Cholesterol: LDL(Bad cholesterol):         Your goal is less than:         HDL (Good cholesterol):       Your goal is more than: Comments:  ______________________________________________________ Mammogram:        Normal _____  Not Normal _____ Comments:  ___________________________________________________________________ Hemoccult:        Normal ___x__  Not normal _______ Comments:    _____________________________________________________________________ Other Tests:    We routinely do not discuss normal results over the telephone.  If you desire a copy of the results, or you have any questions about this information we can discuss them at your next office visit.   Sincerely,   [name] MD

## 2010-11-10 NOTE — Medication Information (Signed)
Summary: rov/ewj  Anticoagulant Therapy  Managed by: Weston Brass, PharmD Referring MD: Graceann Congress MD PCP: Nolon Rod. Paz MD Supervising MD: Shirlee Latch MD, Kamaiyah Uselton Indication 1: Atrial Fibrillation (ICD-427.31) Lab Used: [facility] Site: [facility] INR POC 1.7 INR RANGE 2 - 3  Dietary changes: no    Health status changes: no    Bleeding/hemorrhagic complications: no    Recent/future hospitalizations: no    Any changes in medication regimen? no    Recent/future dental: no  Any missed doses?: no       Is patient compliant with meds? yes       Allergies: 1)  ! * Cialis  Anticoagulation Management History:      Positive risk factors for bleeding include an age of 70 years or older and presence of serious comorbidities.  The bleeding index is 'intermediate risk'.  Positive CHADS2 values include History of CHF, History of HTN, and History of Diabetes.  Negative CHADS2 values include Age > 70 years old.  The start date was 12/03/2003.  His last INR was 2.0.  Anticoagulation responsible provider: Shirlee Latch MD, Gabriella Guile.  INR POC: 1.7.  Exp: 02/2011.    Anticoagulation Management Assessment/Plan:      The patient's current anticoagulation dose is Coumadin 5 mg tabs: Take as directed by coumadin clinic..  The target INR is 2 - 3.  The next INR is due 02/17/2010.  Anticoagulation instructions were given to patient.  Results were reviewed/authorized by Weston Brass, PharmD.  He was notified by Weston Brass PharmD.         Prior Anticoagulation Instructions: INR 3.2  Start taking 1 tablet daily except 1/2 tablet on Wednesdays.  Recheck in 3 weeks.    Current Anticoagulation Instructions: INR 1.7  Increase dose to 1 tablet every day

## 2010-11-10 NOTE — Letter (Signed)
Summary: [facility]: Immunoassay Fecal Occult Blood (iFOB) Order Form  [place] [facility] [place]  [address] [place], [place] [address]   Phone: [phone]  Fax: [phone]      [facility]: Immunoassay Fecal Occult Blood (iFOB) Order Form   December 03, 2009 MRN: [id number]   Bradley [name] Feb 18, 1941   Physicican Name:__________paz_______________  Diagnosis Code:__________v76.51________________      Bradley [name]

## 2010-11-10 NOTE — Progress Notes (Signed)
Summary: refill  Phone Note Refill Request Call back at Home Phone [phone] Message from:  Patient  Refills Requested: Medication #1:  DIGOXIN 0.25 MG TABS Take 1 tablet by mouth once a day [facility] --- [id number]  Initial call taken by: Okey Regal [place],  August 13, 2010 9:51 AM    Prescriptions: DIGOXIN 0.25 MG TABS (DIGOXIN) Take 1 tablet by mouth once a day  #90 x 3   Entered by:   [name] CMA   Authorized by:   Nolon Rod. Paz MD   Signed by:   [name] CMA on 08/13/2010   Method used:   Faxed to ...       [facility] ([facility])       [address]       [place], [place]  [address]       Ph: [phone]       Fax: [phone]   RxID:   [id number]

## 2010-11-10 NOTE — Medication Information (Signed)
Summary: rov/tm  Anticoagulant Therapy  Managed by: Weston Brass, PharmD Referring MD: Graceann Congress MD PCP: Nolon Rod. Paz MD Supervising MD: Ladona Ridgel MD, Sharlot Gowda Indication 1: Atrial Fibrillation (ICD-427.31) Lab Used: [facility] Site: [facility] INR POC 2.9 INR RANGE 2 - 3  Dietary changes: no    Health status changes: no    Bleeding/hemorrhagic complications: no    Recent/future hospitalizations: no    Any changes in medication regimen? no    Recent/future dental: no  Any missed doses?: no       Is patient compliant with meds? yes       Allergies: 1)  ! * Cialis  Anticoagulation Management History:      The patient is taking warfarin and comes in today for a routine follow up visit.  Positive risk factors for bleeding include an age of 70 years or older and presence of serious comorbidities.  The bleeding index is 'intermediate risk'.  Positive CHADS2 values include History of CHF, History of HTN, and History of Diabetes.  Negative CHADS2 values include Age > 34 years old.  The start date was 12/03/2003.  His last INR was 2.0.  Anticoagulation responsible provider: Ladona Ridgel MD, Sharlot Gowda.  INR POC: 2.9.  Cuvette Lot#: [id number].  Exp: 05/2011.    Anticoagulation Management Assessment/Plan:      The patient's current anticoagulation dose is Coumadin 5 mg tabs: Take as directed by coumadin clinic..  The target INR is 2 - 3.  The next INR is due 04/07/2010.  Anticoagulation instructions were given to patient.  Results were reviewed/authorized by Weston Brass, PharmD.  He was notified by Weston Brass PharmD.         Prior Anticoagulation Instructions: INR 3.2 Skip today's dose then resume 5mg  daily. Recheck in 2-3 weeks.   Current Anticoagulation Instructions: INR 2.9  Continue same dose of 1 tablet every day

## 2010-11-10 NOTE — Assessment & Plan Note (Signed)
Summary: 4 month followup/kn   Vital Signs:  Patient profile:   70 year old male Weight:      232.38 pounds Pulse rate:   69 / minute Pulse rhythm:   regular BP sitting:   140 / 82  (left arm) Cuff size:   large  Vitals Entered By: [name] CMA (September 14, 2010 9:56 AM) CC: 4 month f/u- fasting  Comments flu shot  print all rx's.    History of Present Illness: ROV concwerned about the color of the skin in the legs ,  is very dark in the pretibial area. he denies any recent abrupt  increase in color, edema is minimal and at baseline, no warmness  also for a couple of months, he has noted that sometimes get short of breath going up stairs. Symptoms are not consistent with every time he exercised. There is no associated sx-- see ROS  Wonder if it's related with a pleural effusion (history of such , 2008)  Current Medications (verified): 1)  Glyburide 5 Mg Tabs (Glyburide) .... 4 By Mouth Once Daily 2)  Glucophage 850 Mg Tabs (Metformin Hcl) .... Two Times A Day 3)  Januvia 100 Mg Tabs (Sitagliptin Phosphate) .Marland Kitchen.. 1 By Mouth Qd 4)  Metoprolol Tartrate 25 Mg  Tabs (Metoprolol Tartrate) .Marland Kitchen.. 1 By Mouth Bid 5)  Cozaar 100 Mg Tabs (Losartan Potassium) .... Take 1 Tablet By Mouth Once A Day 6)  Lotrel 10-40 Mg Caps (Amlodipine Besy-Benazepril Hcl) .Marland Kitchen.. 1 By Mouth Qd 7)  Cartia Xt 240 Mg Cp24 (Diltiazem Hcl Coated Beads) .... Take 1 Capsule By Mouth Once A Day 8)  Furosemide 20 Mg Tabs (Furosemide) .... Take 1-2 Tablet By Mouth Once A Day 9)  Digoxin 0.25 Mg Tabs (Digoxin) .... Take 1 Tablet By Mouth Once A Day 10)  Advicor 1000-20 Mg Tb24 (Niacin-Lovastatin) .... Take 1 Tablet By Mouth Once A Day 11)  Ambien 10 Mg  Tabs (Zolpidem Tartrate) .... One P.o. Nightly P.r.n. Insomnia 12)  Aspirin 325 Mg Tabs (Aspirin) .Marland Kitchen.. 1 Tab By Mouth Once Daily 13)  Multivitamins  Tabs (Multiple Vitamin) .Marland Kitchen.. 1 Tab By Mouth Once Daily 14)  Mucinex 600 Mg Tb12 (Guaifenesin) .... Prn 15)   Ascensia Contour Test Strips .... Checks Blood Sugar Two Times A Day Dx 250.00 16)  Coumadin 5 Mg Tabs (Warfarin Sodium) .... Take As Directed By Coumadin Clinic.  Allergies (verified): 1)  ! * Cialis  Past History:  Past Medical History: HYPERTENSION (at some point hard to control, saw Dr Lowell Guitar) [name]  AODM dx 1999 ATRIAL FIBRILLATION, PAROXYSMAL   (Dr Ladona Ridgel) 03-2007.Marland KitchenMarland KitchenMarland KitchenStress test neg, ECHO 55% 04-2007: carciac cath w/ no high grade stenosis, +  P-HTN BENIGN PROSTATIC HYPERTROPHY--saw Dr [name] 2004 , normal renal u/s INSOMNIA, CHRONIC  2008: h/o  recurrent  loculated right plural effusion, s/p eval Dr [name] ,  Dr Laneta Simmers. W/U included thoracentesis    Past Surgical History:  Status post large pneumothorax with fibrothorax.   Status post decortication   Status post cardiac catheterization    Social History: Reviewed history from 11/11/2009 and no changes required. Married 1 son moved from [place] He lives with his wife.   No smoking for last 30 years. (used to smoke 2.5 ppd) Last alcohol was 1 beer last week at his son's wedding.   No drugs.   Review of Systems General:  denies fevers, no diaphoresis essentially no edema Ambulatory BPs are checked daily, they run between 120-140 ambulatory CBGs  in the low 100s no nausea or vomiting No chest pain or palpitations.  Physical Exam  General:  alert and well-developed.   Lungs:  normal respiratory effort, no intercostal retractions, no accessory muscle use, and normal breath sounds.   Heart:  irregularly irregular Pulses:  B  pedal pulses  normal Extremities:  trace edema B  Skin:  pretibial skin bilaterally is quite dark, slightly warm. Has several scars and chronic changes Psych:  not anxious appearing and not depressed appearing.     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 1 by mouth bid    Cozaar 100 Mg Tabs (Losartan potassium) .Marland Kitchen... Take 1 tablet by mouth once a day    Lotrel 10-40 Mg Caps (Amlodipine besy-benazepril hcl) .Marland Kitchen... 1 by mouth qd    Cartia Xt 240 Mg Cp24 (Diltiazem hcl coated beads) .Marland Kitchen... Take 1 capsule by mouth once a day    Furosemide 20 Mg Tabs (Furosemide) .Marland Kitchen... Take 1-2 tablet by mouth once a day  BP today: 140/82 Prior BP: 132/82 (05/13/2010)  Labs Reviewed: K+: 4.3 (05/13/2010) Creat: : 0.7 (05/13/2010)   Chol: 152 (05/13/2010)   HDL: 37.60 (05/13/2010)   LDL: 82 (05/13/2010)   TG: 164.0 (05/13/2010)  Orders: TLB-BMP (Basic Metabolic Panel-BMET) (80048-METABOL) Specimen Handling (66440)  Problem # 4:  AODM (ICD-250.00) labs  His updated medication list for this problem includes:    Glyburide 5 Mg Tabs (Glyburide) .Marland KitchenMarland KitchenMarland KitchenMarland Kitchen 4 by mouth once daily    Glucophage 850 Mg Tabs (Metformin hcl) .Marland Kitchen..Marland Kitchen Two times a day    Januvia 100 Mg Tabs (Sitagliptin phosphate) .Marland Kitchen... 1 by mouth qd    Cozaar 100 Mg Tabs (Losartan potassium) .Marland Kitchen... Take 1 tablet by mouth once a day    Lotrel 10-40 Mg Caps (Amlodipine besy-benazepril hcl) .Marland Kitchen... 1 by mouth qd    Aspirin 325 Mg Tabs (Aspirin) .Marland Kitchen... 1 tab by mouth once daily  Labs Reviewed: Creat: 0.7 (05/13/2010)    Reviewed HgBA1c results: 6.3 (05/13/2010)  6.3 (02/09/2010)  Orders: Venipuncture (34742) TLB-A1C / Hgb A1C (Glycohemoglobin) (83036-A1C) Specimen Handling (59563)  Problem # 5:  ATRIAL FIBRILLATION, PAROXYSMAL (ICD-427.31) per cardiology His updated medication list for this problem includes:    Metoprolol Tartrate 25 Mg Tabs (Metoprolol tartrate) .Marland Kitchen... 1 by mouth bid    Lotrel 10-40 Mg Caps  (Amlodipine besy-benazepril hcl) .Marland Kitchen... 1 by mouth qd    Cartia Xt 240 Mg Cp24 (Diltiazem hcl coated beads) .Marland Kitchen... Take 1 capsule by mouth once a day    Digoxin 0.25 Mg Tabs (Digoxin) .Marland Kitchen... Take 1 tablet by mouth once a day    Aspirin 325 Mg Tabs (Aspirin) .Marland Kitchen... 1 tab by mouth once daily    Coumadin 5 Mg Tabs (Warfarin sodium) .Marland Kitchen... Take as directed by coumadin clinic.  Problem # 6:  DYSPNEA ON EXERTION (ICD-786.09) inconsistent  dyspnea on exertion for the last 2 months, see history of present illness. Chest x-ray if XR  negative and system persist, will need cardiac eval  (will see cards 10-08-2010)  His updated medication list for this problem includes:    Metoprolol Tartrate 25 Mg  Tabs (Metoprolol tartrate) .Marland Kitchen... 1 by mouth bid    Lotrel 10-40 Mg Caps (Amlodipine besy-benazepril hcl) .Marland Kitchen... 1 by mouth qd    Furosemide 20 Mg Tabs (Furosemide) .Marland Kitchen... Take 1-2 tablet by mouth once a day  Orders: TLB-TSH (Thyroid Stimulating Hormone) (84443-TSH) TLB-Hemoglobin (Hgb) (85018-HGB) T-2 View CXR (71020TC) Specimen Handling (44034)  Complete Medication List: 1)  Glyburide 5 Mg Tabs (Glyburide) .... 4 by mouth once daily 2)  Glucophage 850 Mg Tabs (Metformin hcl) .... Two times a day 3)  Januvia 100 Mg Tabs (Sitagliptin phosphate) .Marland Kitchen.. 1 by mouth qd 4)  Metoprolol Tartrate 25 Mg Tabs (Metoprolol tartrate) .Marland Kitchen.. 1 by mouth bid 5)  Cozaar 100 Mg Tabs (Losartan potassium) .... Take 1 tablet by mouth once a day 6)  Lotrel 10-40 Mg Caps (Amlodipine besy-benazepril hcl) .Marland Kitchen.. 1 by mouth qd 7)  Cartia Xt 240 Mg Cp24 (Diltiazem hcl coated beads) .... Take 1 capsule by mouth once a day 8)  Furosemide 20 Mg Tabs (Furosemide) .... Take 1-2 tablet by mouth once a day 9)  Digoxin 0.25 Mg Tabs (Digoxin) .... Take 1 tablet by mouth once a day 10)  Advicor 1000-20 Mg Tb24 (Niacin-lovastatin) .... Take 1 tablet by mouth once a day 11)  Ambien 10 Mg Tabs (Zolpidem tartrate) .... One p.o. nightly p.r.n.  insomnia 12)  Aspirin 325 Mg Tabs (Aspirin) .Marland Kitchen.. 1 tab by mouth once daily 13)  Multivitamins Tabs (Multiple vitamin) .Marland Kitchen.. 1 tab by mouth once daily 14)  Mucinex 600 Mg Tb12 (Guaifenesin) .... Prn 15)  Ascensia Contour Test Strips  .... Checks blood sugar two times a day dx 250.00 16)  Coumadin 5 Mg Tabs (Warfarin sodium) .... Take as directed by coumadin clinic.  Other Orders: Flu Vaccine 48yrs + MEDICARE PATIENTS (V4259) Administration Flu vaccine - MCR (G0008) TLB-ALT (SGPT) (84460-ALT) TLB-AST (SGOT) (84450-SGOT)  Patient Instructions: 1)  Please schedule a follow-up appointment in 3 to 4  months . Fasting , physical exam  Prescriptions: ASCENSIA CONTOUR TEST STRIPS checks blood sugar two times a day dx 250.00  #31mo supply x 3   Entered by:   [name] CMA   Authorized by:   Nolon Rod. Paz MD   Signed by:   [name] CMA on 09/14/2010   Method used:   Print then Give to Patient   RxID:   [id number] ADVICOR 1000-20 MG TB24 (NIACIN-LOVASTATIN) Take 1 tablet by mouth once a day  #90 x 3   Entered by:   [name] CMA   Authorized by:   Nolon Rod. Paz MD   Signed by:   [name] CMA on 09/14/2010   Method used:   Print then Give to Patient   RxID:   [id number] DIGOXIN 0.25 MG TABS (DIGOXIN) Take 1 tablet by mouth once a day  #90 x 3   Entered by:   [name] CMA   Authorized by:   Nolon Rod. Paz MD   Signed by:   [name] CMA on 09/14/2010   Method used:   Print then Give to Patient   RxID:   [id number] FUROSEMIDE 20 MG TABS (FUROSEMIDE) Take 1-2 tablet by mouth once a day  #180 x 3   Entered by:   [name] CMA   Authorized by:   Nolon Rod. Paz MD   Signed by:   [name] CMA on 09/14/2010   Method used:   Print then Give to Patient   RxID:   [id number] CARTIA XT  240 MG CP24 (DILTIAZEM HCL COATED BEADS) Take 1 capsule by mouth once a day  #90 x 3   Entered by:   [name] CMA   Authorized by:   Nolon Rod. Paz  MD   Signed by:   [name] CMA on 09/14/2010   Method used:   Print then Give to Patient   RxID:   [id number] LOTREL 10-40 MG CAPS (AMLODIPINE BESY-BENAZEPRIL HCL) 1 by mouth qd  #90 x 3   Entered by:   [name] CMA   Authorized by:   Nolon Rod. Paz MD   Signed by:   [name] CMA on 09/14/2010   Method used:   Print then Give to Patient   RxID:   [id number] COZAAR 100 MG TABS (LOSARTAN POTASSIUM) Take 1 tablet by mouth once a day  #90 x 3   Entered by:   [name] CMA   Authorized by:   Nolon Rod. Paz MD   Signed by:   [name] CMA on 09/14/2010   Method used:   Print then Give to Patient   RxID:   [id number] METOPROLOL TARTRATE 25 MG  TABS (METOPROLOL TARTRATE) 1 by mouth bid  #180 x 3   Entered by:   [name] CMA   Authorized by:   Nolon Rod. Paz MD   Signed by:   [name] CMA on 09/14/2010   Method used:   Print then Give to Patient   RxID:   [id number] JANUVIA 100 MG TABS (SITAGLIPTIN PHOSPHATE) 1 by mouth qd  #90 x 3   Entered by:   [name] CMA   Authorized by:   Nolon Rod. Paz MD   Signed by:   [name] CMA on 09/14/2010   Method used:   Print then Give to Patient   RxID:   [id number] GLUCOPHAGE 850 MG TABS (METFORMIN HCL) two times a day  #180 x 3   Entered by:   [name] CMA   Authorized by:   Nolon Rod. Paz MD   Signed by:   [name] CMA on 09/14/2010   Method used:   Print then Give to Patient   RxID:   [id number] GLYBURIDE 5 MG TABS (GLYBURIDE) 4 by mouth once daily  #360 x 3   Entered by:   [name] CMA   Authorized by:   Nolon Rod. Paz MD   Signed by:   [name] CMA on 09/14/2010   Method used:   Print then Give to Patient   RxID:   [id number]    Orders Added: 1)  Flu Vaccine 83yrs + MEDICARE PATIENTS [Q2039] 2)  Administration Flu vaccine - MCR [G0008] 3)  Venipuncture [85277] 4)  TLB-TSH (Thyroid Stimulating Hormone) [84443-TSH] 5)  TLB-A1C /  Hgb A1C (Glycohemoglobin) [83036-A1C] 6)  TLB-ALT (SGPT) [84460-ALT] 7)  TLB-AST (SGOT) [84450-SGOT] 8)  TLB-BMP (Basic Metabolic Panel-BMET) [80048-METABOL] 9)  TLB-Hemoglobin (Hgb) [85018-HGB] 10)  T-2 View CXR [71020TC] 11)  Specimen Handling [99000] 12)  Dermatology Referral [Derma] 13)  Est. Patient Level IV [82423] Flu Vaccine Consent Questions     Do you have a history of severe allergic reactions to this vaccine? no    Any prior history of allergic reactions to egg and/or gelatin? no    Do you have a sensitivity to the preservative Thimersol? no    Do you have a past history of Guillan-Barre Syndrome? no    Do you currently have an acute febrile illness? no  Have you ever had a severe reaction to latex? no    Vaccine information given and explained to patient? yes    Are you currently pregnant? no    Lot Number:[id number]   Exp Date:04/10/2011   Site Given  Right Deltoid IMration Flu vaccine - MCR [G0008]     .[email or web address]

## 2010-11-10 NOTE — Progress Notes (Signed)
  Phone Note From Pharmacy   [facility] of Call:  - Lotrel 10/40 is on long term back order.  Ok to fill with amlodipine/benazepril 5/20 2 by mouth once daily #180?   - Patient has active presciptions for diltiazem & losartan is he to stop those meds when Lotrel is started?   Initial call taken by: Shary Decamp,  October 24, 2009 12:58 PM  Follow-up for Phone Call        --okay to rf amlodipine/benazepril as above -- he is to continue taking all his medicines including amlodipine, benazepril, cartia, cozaar, etc Follow-up by: Nolon Rod. Shigeo Baugh MD,  October 24, 2009 1:16 PM  Additional Follow-up for Phone Call Additional follow up Details #1::        faxed to [facility] Additional Follow-up by: Shary Decamp,  October 24, 2009 1:34 PM

## 2010-11-10 NOTE — Assessment & Plan Note (Signed)
Summary: CPX- jr   Vital Signs:  Patient profile:   70 year old male Height:      69 inches Weight:      130.4 pounds BMI:     19.33 O2 Sat:      88 % on Room air Temp:     98.5 degrees F Pulse rate:   80 / minute BP sitting:   120 / 82  Vitals Entered By: rachel peeler  O2 Flow:  Room air CC: yearly/ f/u Comments pt. complains of cold symptoms, but had fluid in his lungs about 47yr ago, so wants to make sure that has not came back, has trouble breathing, not sleeping   History of Present Illness: yearly exam, chart reviewed  recent foot infection, MRSA, doing better   pt. complains of cold symptoms, for the last two days he is coughing white sputum review of systems: Denies fever, chest pain, (+) of wheezing which he has on-and-off with colds   HYPERTENSION-- ambulatory BPs fluctuates as high as 175/90 but ususally 130  HYPERLIPIDEMIA -- good medication compliance   AODM-- ambulatory CBGs 110, 100, occasionally 90     Allergies: 1)  ! * Cialis  Past History:  Past Medical History: HYPERTENSION (at some point hard to control, saw Dr Lowell Guitar) [name]  AODM dx 1999 ATRIAL FIBRILLATION, PAROXYSMAL    03-2007.Marland KitchenMarland KitchenMarland KitchenStress test neg BENIGN PROSTATIC HYPERTROPHY--saw Dr [name] 2004 , normal renal u/s INSOMNIA, CHRONIC  2008: h/o  recurrent  loculated right plural effusion, s/p eval Dr [name] ,  Dr Laneta Simmers. W/U included thoracentesis           Past Surgical History:  Status post large pneumothorax with fibrothorax.   Status post decortication   Status post cardiac catheterization May 09, 2008   Family History: Reviewed history from 01/06/2009 and no changes required. MI-- no DM-- F Colon ca-- no prostate ca--no  Social History: Married 1 son moved from [place] 2003 He lives with his wife.   No smoking for last 30 years. (used to smoke 2.5 ppd) Last alcohol was 1 beer last week at his son's wedding.   No drugs.   Review of Systems      See  HPI CV:  no orthopnea no edema @ LE. GI:  Denies bloody stools, diarrhea, nausea, and vomiting. GU:  Denies dysuria, hematuria, urinary frequency, and urinary hesitancy. Psych:  Denies anxiety and depression.  Physical Exam  General:  alert and well-developed.  central density noticed Neck:  no thyromegaly and normal carotid upstroke.   Lungs:  normal respiratory effort and no intercostal retractions.   wheezing  throughout, few rhonchi, no crackles; no increased work of breathing Heart:  irregularly irregular Abdomen:  soft, non-tender, no distention, no masses, no guarding, and no rigidity.  no bruit Rectal:  No external abnormalities noted. Normal sphincter tone. No rectal masses or tenderness. Hemoccult negative Prostate:  Prostate gland firm and smooth, no enlargement, nodularity, tenderness, mass, asymmetry or induration. Extremities:  no lower extremity edema right foot: no swelling, maceration between the toes improved  but still there the third toe is  normal to inspection Psych:  Cognition and judgment appear intact. Alert and cooperative with normal attention span and concentration    Impression & Recommendations:  Problem # 1:  DERMATOPHYTOSIS OF FOOT (ICD-110.4) wound resolved  still have some maceration between the toes, extend ketoconazole for 10 more days His updated medication list for this problem includes:    Ketoconazole 2 % Crea (Ketoconazole) .Marland KitchenMarland KitchenMarland KitchenMarland Kitchen  Apply two times a day betwen the toes x 10 days (both foot)  Problem # 2:  HEALTH SCREENING (ICD-V70.0) Td 2004 had a flu shot pneumonia shot 2005 and reportedly had another dose in the hospital   Cscope: never had one he has a # of comorbidities and at this point I rec to do as  iFOB for Colon ca screening  check a PSA  printed material provided regards diet provided   Problem # 3:  REACTIVE AIRWAY DISEASE (ICD-493.90) the patient is a former heavy smoker, he wheezes  whenever he has a cold.  He likely has  reactive airway disease current exacerbation will be treated with antibiotics and albuterol, see instructions daily  medication if the wheezing becames more chronic  His updated medication list for this problem includes:    Ventolin Hfa 108 (90 Base) Mcg/act Aers (Albuterol sulfate) .Marland Kitchen..Marland Kitchen Two sprays every 6 hours as needed for cough and wheezing  Orders: Prescription Created Electronically [phone])  Problem # 4:  HYPERLIPIDEMIA (ICD-272.4)  at goal  His updated medication list for this problem includes:    Advicor 1000-20 Mg Tb24 (Niacin-lovastatin) .Marland Kitchen... Take 1 tablet by mouth once a day  Orders: Venipuncture (11914) TLB-ALT (SGPT) (84460-ALT) TLB-AST (SGOT) (84450-SGOT)  Problem # 5:  HYPERTENSION (ICD-401.9) at goal  His updated medication list for this problem includes:    Metoprolol Tartrate 25 Mg Tabs (Metoprolol tartrate) .Marland Kitchen... 1 by mouth bid    Cozaar 100 Mg Tabs (Losartan potassium) .Marland Kitchen... Take 1 tablet by mouth once a day    Lotrel 10-40 Mg Caps (Amlodipine besy-benazepril hcl) .Marland Kitchen... 1 by mouth qd    Cartia Xt 240 Mg Cp24 (Diltiazem hcl coated beads) .Marland Kitchen... Take 1 capsule by mouth once a day    Furosemide 20 Mg Tabs (Furosemide) .Marland Kitchen... Take 1-2 tablet by mouth once a day    BP today: 120/82 Prior BP: 130/80 (10/23/2009)  Labs Reviewed: K+: 4.5 (01/06/2009) Creat: : 0.8 (01/06/2009)   Chol: 157 (07/11/2009)   HDL: 38.80 (07/11/2009)   LDL: 84 (07/11/2009)   TG: 169.0 (07/11/2009)  Orders: TLB-BMP (Basic Metabolic Panel-BMET) (80048-METABOL) TLB-CBC Platelet - w/Differential (85025-CBCD)  Problem # 6:  AODM (ICD-250.00) apparently well controlled, labs His updated medication list for this problem includes:    Glyburide 5 Mg Tabs (Glyburide) .Marland KitchenMarland KitchenMarland KitchenMarland Kitchen 4 by mouth once daily    Glucophage 500 Mg Tabs (Metformin hcl) .Marland Kitchen... 1 by mouth two times a day    Januvia 100 Mg Tabs (Sitagliptin phosphate) .Marland Kitchen... 1 by mouth qd    Cozaar 100 Mg Tabs (Losartan potassium) .Marland Kitchen... Take 1  tablet by mouth once a day    Lotrel 10-40 Mg Caps (Amlodipine besy-benazepril hcl) .Marland Kitchen... 1 by mouth qd    Aspirin 325 Mg Tabs (Aspirin) .Marland Kitchen... 1 tab by mouth once daily  Orders: TLB-A1C / Hgb A1C (Glycohemoglobin) (83036-A1C)  Complete Medication List: 1)  Glyburide 5 Mg Tabs (Glyburide) .... 4 by mouth once daily 2)  Glucophage 500 Mg Tabs (Metformin hcl) .Marland Kitchen.. 1 by mouth two times a day 3)  Januvia 100 Mg Tabs (Sitagliptin phosphate) .Marland Kitchen.. 1 by mouth qd 4)  Metoprolol Tartrate 25 Mg Tabs (Metoprolol tartrate) .Marland Kitchen.. 1 by mouth bid 5)  Cozaar 100 Mg Tabs (Losartan potassium) .... Take 1 tablet by mouth once a day 6)  Lotrel 10-40 Mg Caps (Amlodipine besy-benazepril hcl) .Marland Kitchen.. 1 by mouth qd 7)  Cartia Xt 240 Mg Cp24 (Diltiazem hcl coated beads) .... Take 1 capsule by mouth once  a day 8)  Furosemide 20 Mg Tabs (Furosemide) .... Take 1-2 tablet by mouth once a day 9)  Digoxin 0.25 Mg Tabs (Digoxin) .... Take 1 tablet by mouth once a day 10)  Advicor 1000-20 Mg Tb24 (Niacin-lovastatin) .... Take 1 tablet by mouth once a day 11)  Flomax 0.4 Mg Cp24 (Tamsulosin hcl) .Marland Kitchen.. 1 capsule once a day 12)  Ambien 10 Mg Tabs (Zolpidem tartrate) .... One p.o. nightly p.r.n. insomnia 13)  Aspirin 325 Mg Tabs (Aspirin) .Marland Kitchen.. 1 tab by mouth once daily 14)  Multivitamins Tabs (Multiple vitamin) .Marland Kitchen.. 1 tab by mouth once daily 15)  Mucinex 600 Mg Tb12 (Guaifenesin) .... Prn 16)  Ascensia Contour Test Strips  .... Checks blood sugar two times a day dx 250.00 17)  Coumadin 5 Mg Tabs (Warfarin sodium) .... Take as directed by coumadin clinic. 18)  Ketoconazole 2 % Crea (Ketoconazole) .... Apply two times a day betwen the toes x 10 days (both foot) 19)  Cefprozil 500 Mg Tabs (Cefprozil) .... One by mouth twice a day for one week 20)  Ventolin Hfa 108 (90 Base) Mcg/act Aers (Albuterol sulfate) .... Two sprays every 6 hours as needed for cough and wheezing  Other Orders: TLB-PSA (Prostate Specific Antigen)  (84153-PSA)  Patient Instructions: 1)  rest, fluids, Tylenol 2)  Robitussin-DM as needed for cough 3)  antibiotics for one week 4)  use albuterol as prescribed whenever you feel wheezy  or you have persistent cough 5)  if the wheezing and cough persist beyond 10 days, let me know 6)  keep  using ketoconazole on your feet for 10 days  7)  Please schedule a follow-up appointment in 3 months .  Prescriptions: VENTOLIN HFA 108 (90 BASE) MCG/ACT AERS (ALBUTEROL SULFATE) two sprays every 6 hours as needed for cough and wheezing  #1 x 1   Entered and Authorized by:   Nolon Rod. Londan Coplen MD   Signed by:   Nolon Rod. Wendee Hata MD on 11/11/2009   Method used:   Electronically to        [facility] [phone]* (retail)       [address]       [place], [place]  [address]       Ph: [phone]       Fax: [phone]   RxID:   [id number] CEFPROZIL 500 MG TABS (CEFPROZIL) one by mouth twice a day for one week  #14 x 0   Entered and Authorized by:   Nolon Rod. Filomena Pokorney MD   Signed by:   Nolon Rod. Leonte Horrigan MD on 11/11/2009   Method used:   [facility] to        [facility] [phone]* (retail)       [address]       [place], [place]  [address]       Ph: [phone]       Fax: [phone]   RxID:   [id number]    Preventive Care Screening  Prior Values:    PSA:  3.38 (10/18/2006)    Last Tetanus Booster:  Td (03/12/2003)    Last Flu Shot:  Fluvax 3+ (07/11/2009)    Last Pneumovax:  Pneumovax (03/30/2009)    Immunization History:  Influenza Immunization History:    Influenza:  mch (03/30/2009)

## 2010-11-10 NOTE — Assessment & Plan Note (Signed)
Summary: 3 month rob//lh   Vital Signs:  Patient profile:   70 year old male Height:      69 inches Weight:      229.8 pounds BMI:     34.06 Pulse rate:   84 / minute BP sitting:   122 / 80  Vitals Entered By: Shary Decamp (Feb 09, 2010 12:43 PM) CC: rov   History of Present Illness: routine office visit  AODM -- we increased metformin base on last A1C , ambulatory CBGs better ----> ranges from 110 to 140 depending on diet. Diet in general is excellent per patient   BENIGN PROSTATIC HYPERTROPHY--last PSA 2/11 was increased, will  recheck in 6 months  ED -- likes to  try something different to cialis   Current Medications (verified): 1)  Glyburide 5 Mg Tabs (Glyburide) .... 4 By Mouth Once Daily 2)  Glucophage 850 Mg Tabs (Metformin Hcl) .... Two Times A Day 3)  Januvia 100 Mg Tabs (Sitagliptin Phosphate) .Marland Kitchen.. 1 By Mouth Qd 4)  Metoprolol Tartrate 25 Mg  Tabs (Metoprolol Tartrate) .Marland Kitchen.. 1 By Mouth Bid 5)  Cozaar 100 Mg Tabs (Losartan Potassium) .... Take 1 Tablet By Mouth Once A Day 6)  Lotrel 10-40 Mg Caps (Amlodipine Besy-Benazepril Hcl) .Marland Kitchen.. 1 By Mouth Qd 7)  Cartia Xt 240 Mg Cp24 (Diltiazem Hcl Coated Beads) .... Take 1 Capsule By Mouth Once A Day 8)  Furosemide 20 Mg Tabs (Furosemide) .... Take 1-2 Tablet By Mouth Once A Day 9)  Digoxin 0.25 Mg Tabs (Digoxin) .... Take 1 Tablet By Mouth Once A Day 10)  Advicor 1000-20 Mg Tb24 (Niacin-Lovastatin) .... Take 1 Tablet By Mouth Once A Day 11)  Ambien 10 Mg  Tabs (Zolpidem Tartrate) .... One P.o. Nightly P.r.n. Insomnia 12)  Aspirin 325 Mg Tabs (Aspirin) .Marland Kitchen.. 1 Tab By Mouth Once Daily 13)  Multivitamins  Tabs (Multiple Vitamin) .Marland Kitchen.. 1 Tab By Mouth Once Daily 14)  Mucinex 600 Mg Tb12 (Guaifenesin) .... Prn 15)  Ascensia Contour Test Strips .... Checks Blood Sugar Two Times A Day Dx 250.00 16)  Coumadin 5 Mg Tabs (Warfarin Sodium) .... Take As Directed By Coumadin Clinic.  Allergies (verified): 1)  ! * Cialis  Past  History:  Past Medical History: Reviewed history from 11/11/2009 and no changes required. HYPERTENSION (at some point hard to control, saw Dr Lowell Guitar) [name]  AODM dx 1999 ATRIAL FIBRILLATION, PAROXYSMAL    03-2007.Marland KitchenMarland KitchenMarland KitchenStress test neg BENIGN PROSTATIC HYPERTROPHY--saw Dr [name] 2004 , normal renal u/s INSOMNIA, CHRONIC  2008: h/o  recurrent  loculated right plural effusion, s/p eval Dr [name] ,  Dr Laneta Simmers. W/U included thoracentesis           Past Surgical History: Reviewed history from 11/11/2009 and no changes required.  Status post large pneumothorax with fibrothorax.   Status post decortication   Status post cardiac catheterization May 09, 2008   Social History: Reviewed history from 11/11/2009 and no changes required. Married 1 son moved from [place] He lives with his wife.   No smoking for last 30 years. (used to smoke 2.5 ppd) Last alcohol was 1 beer last week at his son's wedding.   No drugs.   Review of Systems       still have problems with erectile dysfunction, cialis caused HA and back pain, wonders if he can try another medication feet fungal  infection resolved CV:  Denies chest pain or discomfort and swelling of feet. Resp:  Denies cough and shortness  of breath. GI:  Denies diarrhea, nausea, and vomiting.  Physical Exam  General:  alert and well-developed.   Lungs:  normal respiratory effort, no intercostal retractions, no accessory muscle use, and normal breath sounds.   Heart:  irregularly irregular Extremities:  trace pretibial edema   Impression & Recommendations:  Problem # 1:  DERMATOPHYTOSIS OF FOOT (ICD-110.4) resolved The following medications were removed from the medication list:    Ketoconazole 2 % Crea (Ketoconazole) .Marland Kitchen... Apply two times a day betwen the toes x 10 days (both foot)  Problem # 2:  ERECTILE DYSFUNCTION (ICD-302.72) would like to try another medication different than cialis in the past, Viagra did not help  much but we will retry it. Reminded  about side effects and interaction with NTG  His updated medication list for this problem includes:    Viagra 100 Mg Tabs (Sildenafil citrate) ..... One by mouth daily as needed  Problem # 3:  HYPERTENSION (ICD-401.9) at goal  His updated medication list for this problem includes:    Metoprolol Tartrate 25 Mg Tabs (Metoprolol tartrate) .Marland Kitchen... 1 by mouth bid    Cozaar 100 Mg Tabs (Losartan potassium) .Marland Kitchen... Take 1 tablet by mouth once a day    Lotrel 10-40 Mg Caps (Amlodipine besy-benazepril hcl) .Marland Kitchen... 1 by mouth qd    Cartia Xt 240 Mg Cp24 (Diltiazem hcl coated beads) .Marland Kitchen... Take 1 capsule by mouth once a day    Furosemide 20 Mg Tabs (Furosemide) .Marland Kitchen... Take 1-2 tablet by mouth once a day  BP today: 122/80 Prior BP: 120/82 (11/11/2009)  Labs Reviewed: K+: 4.9 (11/11/2009) Creat: : 1.1 (11/11/2009)   Chol: 157 (07/11/2009)   HDL: 38.80 (07/11/2009)   LDL: 84 (07/11/2009)   TG: 169.0 (07/11/2009)  Problem # 4:  AODM (ICD-250.00) dose  of Glucophage was increased based on the last A1c, tolerating well, labs  His updated medication list for this problem includes:    Glyburide 5 Mg Tabs (Glyburide) .Marland KitchenMarland KitchenMarland KitchenMarland Kitchen 4 by mouth once daily    Glucophage 850 Mg Tabs (Metformin hcl) .Marland Kitchen..Marland Kitchen Two times a day    Januvia 100 Mg Tabs (Sitagliptin phosphate) .Marland Kitchen... 1 by mouth qd    Cozaar 100 Mg Tabs (Losartan potassium) .Marland Kitchen... Take 1 tablet by mouth once a day    Lotrel 10-40 Mg Caps (Amlodipine besy-benazepril hcl) .Marland Kitchen... 1 by mouth qd    Aspirin 325 Mg Tabs (Aspirin) .Marland Kitchen... 1 tab by mouth once daily  Orders: Venipuncture (16109) TLB-ALT (SGPT) (84460-ALT) TLB-A1C / Hgb A1C (Glycohemoglobin) (83036-A1C) TLB-AST (SGOT) (84450-SGOT) Prescription Created Electronically [phone])  Problem # 5:  BENIGN PROSTATIC HYPERTROPHY (ICD-600.00) last PSA slightly elevated, recheck  8/11  Complete Medication List: 1)  Glyburide 5 Mg Tabs (Glyburide) .... 4 by mouth once daily 2)   Glucophage 850 Mg Tabs (Metformin hcl) .... Two times a day 3)  Januvia 100 Mg Tabs (Sitagliptin phosphate) .Marland Kitchen.. 1 by mouth qd 4)  Metoprolol Tartrate 25 Mg Tabs (Metoprolol tartrate) .Marland Kitchen.. 1 by mouth bid 5)  Cozaar 100 Mg Tabs (Losartan potassium) .... Take 1 tablet by mouth once a day 6)  Lotrel 10-40 Mg Caps (Amlodipine besy-benazepril hcl) .Marland Kitchen.. 1 by mouth qd 7)  Cartia Xt 240 Mg Cp24 (Diltiazem hcl coated beads) .... Take 1 capsule by mouth once a day 8)  Furosemide 20 Mg Tabs (Furosemide) .... Take 1-2 tablet by mouth once a day 9)  Digoxin 0.25 Mg Tabs (Digoxin) .... Take 1 tablet by mouth once a day 10)  Advicor  1000-20 Mg Tb24 (Niacin-lovastatin) .... Take 1 tablet by mouth once a day 11)  Ambien 10 Mg Tabs (Zolpidem tartrate) .... One p.o. nightly p.r.n. insomnia 12)  Aspirin 325 Mg Tabs (Aspirin) .Marland Kitchen.. 1 tab by mouth once daily 13)  Multivitamins Tabs (Multiple vitamin) .Marland Kitchen.. 1 tab by mouth once daily 14)  Mucinex 600 Mg Tb12 (Guaifenesin) .... Prn 15)  Ascensia Contour Test Strips  .... Checks blood sugar two times a day dx 250.00 16)  Coumadin 5 Mg Tabs (Warfarin sodium) .... Take as directed by coumadin clinic. 17)  Viagra 100 Mg Tabs (Sildenafil citrate) .... One by mouth daily as needed  Patient Instructions: 1)  Please schedule a follow-up appointment in 4 months .  Prescriptions: VIAGRA 100 MG TABS (SILDENAFIL CITRATE) one by mouth daily as needed  #10 x 3   Entered and Authorized by:   Elita Quick E. Jeremaine Maraj MD   Signed by:   Nolon Rod. Zeynep Fantroy MD on 02/09/2010   Method used:   Print then Give to Patient   RxID:   [id number] ASCENSIA CONTOUR TEST STRIPS checks blood sugar two times a day dx 250.00  #55mo supply x 3   Entered by:   Shary Decamp   Authorized by:   Nolon Rod. Zeriyah Wain MD   Signed by:   Shary Decamp on 02/09/2010   Method used:   Print then Give to Patient   RxID:   [id number] ADVICOR 1000-20 MG TB24 (NIACIN-LOVASTATIN) Take 1 tablet by mouth once a day  #90 x 3   Entered  by:   Shary Decamp   Authorized by:   Nolon Rod. Terell Kincy MD   Signed by:   Shary Decamp on 02/09/2010   Method used:   Print then Give to Patient   RxID:   [id number] DIGOXIN 0.25 MG TABS (DIGOXIN) Take 1 tablet by mouth once a day  #90 x 3   Entered by:   Shary Decamp   Authorized by:   Nolon Rod. Ellakate Gonsalves MD   Signed by:   Shary Decamp on 02/09/2010   Method used:   Print then Give to Patient   RxID:   [id number] FUROSEMIDE 20 MG TABS (FUROSEMIDE) Take 1-2 tablet by mouth once a day  #180 x 3   Entered by:   Shary Decamp   Authorized by:   Nolon Rod. Larence Thone MD   Signed by:   Shary Decamp on 02/09/2010   Method used:   Print then Give to Patient   RxID:   [id number] CARTIA XT 240 MG CP24 (DILTIAZEM HCL COATED BEADS) Take 1 capsule by mouth once a day  #90 x 3   Entered by:   Shary Decamp   Authorized by:   Nolon Rod. Lovella Hardie MD   Signed by:   Shary Decamp on 02/09/2010   Method used:   Print then Give to Patient   RxID:   [id number] LOTREL 10-40 MG CAPS (AMLODIPINE BESY-BENAZEPRIL HCL) 1 by mouth qd  #90 x 3   Entered by:   Shary Decamp   Authorized by:   Nolon Rod. Calvyn Kurtzman MD   Signed by:   Shary Decamp on 02/09/2010   Method used:   Print then Give to Patient   RxID:   [id number] COZAAR 100 MG TABS (LOSARTAN POTASSIUM) Take 1 tablet by mouth once a day  #90 x 3   Entered by:   Shary Decamp   Authorized by:   Nolon Rod. Kason Benak MD   Signed by:  Shary Decamp on 02/09/2010   Method used:   Print then Give to Patient   RxID:   [id number] METOPROLOL TARTRATE 25 MG  TABS (METOPROLOL TARTRATE) 1 by mouth bid  #180 x 3   Entered by:   Shary Decamp   Authorized by:   Nolon Rod. Mumtaz Lovins MD   Signed by:   Shary Decamp on 02/09/2010   Method used:   Print then Give to Patient   RxID:   [id number] JANUVIA 100 MG TABS (SITAGLIPTIN PHOSPHATE) 1 by mouth qd  #90 x 3   Entered by:   Shary Decamp   Authorized by:   Nolon Rod. Floyed Masoud MD   Signed by:   Shary Decamp on 02/09/2010   Method used:    Print then Give to Patient   RxID:   [id number] GLUCOPHAGE 850 MG TABS (METFORMIN HCL) two times a day  #180 x 3   Entered by:   Shary Decamp   Authorized by:   Nolon Rod. Milly Goggins MD   Signed by:   Shary Decamp on 02/09/2010   Method used:   Print then Give to Patient   RxID:   [id number] GLYBURIDE 5 MG TABS (GLYBURIDE) 4 by mouth once daily  #360 x 3   Entered by:   Shary Decamp   Authorized by:   Nolon Rod. Soliyana Mcchristian MD   Signed by:   Shary Decamp on 02/09/2010   Method used:   Print then Give to Patient   RxID:   [id number]

## 2010-11-10 NOTE — Assessment & Plan Note (Signed)
Summary: INFECTION ON NECK/KN   Vital Signs:  Patient profile:   70 year old male Weight:      229.25 pounds Temp:     99.1 degrees F oral Pulse rate:   85 / minute Pulse rhythm:   regular BP sitting:   132 / 80  (left arm) Cuff size:   large  Vitals Entered By: [name] CMA (April 29, 2010 3:27 PM) CC: Pt here infection on neck?  Comments Woke up yesterday with a HA and stiff neck. Put an topiacl anaglesic on neck then. Neck is very red now, feels like it is pinching him.    History of Present Illness: had a headache yesterday, he put  "Activ-on" a topical remedy for pain in his neck Few hours later he felt some oozing from  that area This morning the area is swollen, red , tender  ROS  denies fevers No itching Headache is resolved Patient is leaving town tomorrow   Current Medications (verified): 1)  Glyburide 5 Mg Tabs (Glyburide) .... 4 By Mouth Once Daily 2)  Glucophage 850 Mg Tabs (Metformin Hcl) .... Two Times A Day 3)  Januvia 100 Mg Tabs (Sitagliptin Phosphate) .Marland Kitchen.. 1 By Mouth Qd 4)  Metoprolol Tartrate 25 Mg  Tabs (Metoprolol Tartrate) .Marland Kitchen.. 1 By Mouth Bid 5)  Cozaar 100 Mg Tabs (Losartan Potassium) .... Take 1 Tablet By Mouth Once A Day 6)  Lotrel 10-40 Mg Caps (Amlodipine Besy-Benazepril Hcl) .Marland Kitchen.. 1 By Mouth Qd 7)  Cartia Xt 240 Mg Cp24 (Diltiazem Hcl Coated Beads) .... Take 1 Capsule By Mouth Once A Day 8)  Furosemide 20 Mg Tabs (Furosemide) .... Take 1-2 Tablet By Mouth Once A Day 9)  Digoxin 0.25 Mg Tabs (Digoxin) .... Take 1 Tablet By Mouth Once A Day 10)  Advicor 1000-20 Mg Tb24 (Niacin-Lovastatin) .... Take 1 Tablet By Mouth Once A Day 11)  Ambien 10 Mg  Tabs (Zolpidem Tartrate) .... One P.o. Nightly P.r.n. Insomnia 12)  Aspirin 325 Mg Tabs (Aspirin) .Marland Kitchen.. 1 Tab By Mouth Once Daily 13)  Multivitamins  Tabs (Multiple Vitamin) .Marland Kitchen.. 1 Tab By Mouth Once Daily 14)  Mucinex 600 Mg Tb12 (Guaifenesin) .... Prn 15)  Ascensia Contour Test Strips .... Checks  Blood Sugar Two Times A Day Dx 250.00 16)  Coumadin 5 Mg Tabs (Warfarin Sodium) .... Take As Directed By Coumadin Clinic. 17)  Viagra 100 Mg Tabs (Sildenafil Citrate) .... One By Mouth Daily As Needed  Allergies: 1)  ! * Cialis  Past History:  Past Medical History: Reviewed history from 11/11/2009 and no changes required. HYPERTENSION (at some point hard to control, saw Dr Lowell Guitar) [name]  AODM dx 1999 ATRIAL FIBRILLATION, PAROXYSMAL    03-2007.Marland KitchenMarland KitchenMarland KitchenStress test neg BENIGN PROSTATIC HYPERTROPHY--saw Dr [name] 2004 , normal renal u/s INSOMNIA, CHRONIC  2008: h/o  recurrent  loculated right plural effusion, s/p eval Dr [name] ,  Dr Laneta Simmers. W/U included thoracentesis           Past Surgical History: Reviewed history from 11/11/2009 and no changes required.  Status post large pneumothorax with fibrothorax.   Status post decortication   Status post cardiac catheterization May 09, 2008   Review of Systems      See HPI  Physical Exam  General:  alert, well-developed, and overweight-appearing.   Skin:  the skin in the posterior and sides (L> R) of the neck  is red, swollen, tender, has a few blisters with golden crust   Impression & Recommendations:  Problem # 1:  CONTACT DERMATITIS (ICD-692.9) most likely a contact dermatitis to the over-the-counter topical medicine Recommend avoidance He may be developing a  infection, I will also prescribe antibiotics see  instructions  Complete Medication List: 1)  Glyburide 5 Mg Tabs (Glyburide) .... 4 by mouth once daily 2)  Glucophage 850 Mg Tabs (Metformin hcl) .... Two times a day 3)  Januvia 100 Mg Tabs (Sitagliptin phosphate) .Marland Kitchen.. 1 by mouth qd 4)  Metoprolol Tartrate 25 Mg Tabs (Metoprolol tartrate) .Marland Kitchen.. 1 by mouth bid 5)  Cozaar 100 Mg Tabs (Losartan potassium) .... Take 1 tablet by mouth once a day 6)  Lotrel 10-40 Mg Caps (Amlodipine besy-benazepril hcl) .Marland Kitchen.. 1 by mouth qd 7)  Cartia Xt 240 Mg Cp24 (Diltiazem hcl  coated beads) .... Take 1 capsule by mouth once a day 8)  Furosemide 20 Mg Tabs (Furosemide) .... Take 1-2 tablet by mouth once a day 9)  Digoxin 0.25 Mg Tabs (Digoxin) .... Take 1 tablet by mouth once a day 10)  Advicor 1000-20 Mg Tb24 (Niacin-lovastatin) .... Take 1 tablet by mouth once a day 11)  Ambien 10 Mg Tabs (Zolpidem tartrate) .... One p.o. nightly p.r.n. insomnia 12)  Aspirin 325 Mg Tabs (Aspirin) .Marland Kitchen.. 1 tab by mouth once daily 13)  Multivitamins Tabs (Multiple vitamin) .Marland Kitchen.. 1 tab by mouth once daily 14)  Mucinex 600 Mg Tb12 (Guaifenesin) .... Prn 15)  Ascensia Contour Test Strips  .... Checks blood sugar two times a day dx 250.00 16)  Coumadin 5 Mg Tabs (Warfarin sodium) .... Take as directed by coumadin clinic. 17)  Viagra 100 Mg Tabs (Sildenafil citrate) .... One by mouth daily as needed 18)  Hydrocortisone 2.5 % Crea (Hydrocortisone) .... Applied 3 times a day to the neck for 5 days 19)  Cephalexin 500 Mg Caps (Cephalexin) .... One by mouth every 6 hours for 5 days  Patient Instructions: 1)  avoid "Activ-on" 2)  Use the hydrocortisone cream as prescribed 3)  cephalexin by mouth for 5 days 4)  Call if the  redness gets worse Prescriptions: CEPHALEXIN 500 MG CAPS (CEPHALEXIN) one by mouth every 6 hours for 5 days  #20 x 0   Entered and Authorized by:   Nolon Rod. Dodger Sinning MD   Signed by:   Nolon Rod. Shelbe Haglund MD on 04/29/2010   Method used:   Electronically to        [facility] [phone]* (retail)       [address]       [place], [place]  [address]       Ph: [phone]       Fax: [phone]   RxID:   [id number] HYDROCORTISONE 2.5 % CREA (HYDROCORTISONE) applied 3 times a day to the neck for 5 days  #1 x 0   Entered and Authorized by:   Nolon Rod. Jethro Radke MD   Signed by:   Nolon Rod. Jamelia Varano MD on 04/29/2010   Method used:   Electronically to        [facility] [phone]* (retail)       [address]       [place], [place]   [address]       Ph: [phone]       Fax: [phone]   RxID:   [id number]

## 2010-11-10 NOTE — Medication Information (Signed)
Summary: rov/sp   Anticoagulant Therapy  Managed by: Weston Brass, PharmD Referring MD: Ladona Ridgel PCP: Nolon Rod. Paz MD Supervising MD: Graciela Husbands MD, Viviann Spare Indication 1: Atrial Fibrillation (ICD-427.31) Lab Used: [facility] Site: [facility] INR POC 2.1 INR RANGE 2 - 3  Dietary changes: no    Health status changes: no    Bleeding/hemorrhagic complications: no    Recent/future hospitalizations: no    Any changes in medication regimen? no    Recent/future dental: no  Any missed doses?: no       Is patient compliant with meds? yes       Allergies: 1)  ! * Cialis  Anticoagulation Management History:      The patient is taking warfarin and comes in today for a routine follow up visit.  Positive risk factors for bleeding include an age of 71 years or older and presence of serious comorbidities.  The bleeding index is 'intermediate risk'.  Positive CHADS2 values include History of CHF, History of HTN, and History of Diabetes.  Negative CHADS2 values include Age > 49 years old.  The start date was 12/03/2003.  His last INR was 2.0.  Anticoagulation responsible provider: Graciela Husbands MD, Viviann Spare.  INR POC: 2.1.  Cuvette Lot#: [id number].  Exp: 08/2011.    Anticoagulation Management Assessment/Plan:      The patient's current anticoagulation dose is Coumadin 5 mg tabs: Take as directed by coumadin clinic..  The target INR is 2 - 3.  The next INR is due 08/21/2010.  Anticoagulation instructions were given to patient.  Results were reviewed/authorized by Weston Brass, PharmD.  He was notified by Weston Brass PharmD.         Prior Anticoagulation Instructions: INR 3.1  Take 1/2 tablet today then resume same dose of 1 tablet every day. Recheck INR in 4 weeks.    Current Anticoagulation Instructions: INR 2.1  Continue same dose of 1 tablet every day.  Recheck INR in 4 weeks.

## 2010-11-10 NOTE — Progress Notes (Signed)
  Phone Note Refill Request Message from:  Patient on November 17, 2009 1:31 PM  Refills Requested: Medication #1:  AMBIEN 10 MG  TABS one p.o. nightly p.r.n. insomnia   Notes: rx'd #90 with 1 refill on 01/06/09  [facility]ed[facility] Next Appointment Scheduled: had cpx 11/2009 Initial call taken by: Shary Decamp,  November 17, 2009 1:32 PM    Prescriptions: AMBIEN 10 MG  TABS (ZOLPIDEM TARTRATE) one p.o. nightly p.r.n. insomnia  #90 x 1   Entered by:   Shary Decamp   Authorized by:   Nolon Rod. Styles Fambro MD   Signed by:   Shary Decamp on 11/17/2009   Method used:   Printed then faxed to ...       [facility] (mail-order)       [address]       [place], [place]  [address]       Ph: [phone]       Fax: [phone]   RxID:   [id number]

## 2010-11-10 NOTE — Medication Information (Signed)
Summary: rov/sp  Anticoagulant Therapy  Managed by: Weston Brass, PharmD Referring MD: Graceann Congress MD PCP: Nolon Rod. Paz MD Supervising MD: Juanda Chance MD, Bruce Indication 1: Atrial Fibrillation (ICD-427.31) Lab Used: [facility] Site: [facility] INR POC 3.1 INR RANGE 2 - 3  Dietary changes: no    Health status changes: no    Bleeding/hemorrhagic complications: no    Recent/future hospitalizations: no    Any changes in medication regimen? no    Recent/future dental: no  Any missed doses?: no       Is patient compliant with meds? yes       Allergies: 1)  ! * Cialis  Anticoagulation Management History:      The patient is taking warfarin and comes in today for a routine follow up visit.  Positive risk factors for bleeding include an age of 70 years or older and presence of serious comorbidities.  The bleeding index is 'intermediate risk'.  Positive CHADS2 values include History of CHF, History of HTN, and History of Diabetes.  Negative CHADS2 values include Age > 70 years old.  The start date was 12/03/2003.  His last INR was 2.0.  Anticoagulation responsible provider: Juanda Chance MD, Smitty Cords.  INR POC: 3.1.  Cuvette Lot#: [id number].  Exp: 08/2011.    Anticoagulation Management Assessment/Plan:      The patient's current anticoagulation dose is Coumadin 5 mg tabs: Take as directed by coumadin clinic..  The target INR is 2 - 3.  The next INR is due 07/29/2010.  Anticoagulation instructions were given to patient.  Results were reviewed/authorized by Weston Brass, PharmD.  He was notified by Weston Brass PharmD.         Prior Anticoagulation Instructions: INR 3.0  Continue same dose of 1 tablet every day.  Recheck INR in 4 weeks.   Current Anticoagulation Instructions: INR 3.1  Take 1/2 tablet today then resume same dose of 1 tablet every day. Recheck INR in 4 weeks.

## 2010-11-10 NOTE — Progress Notes (Signed)
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   Is pt supposed to be on lotrel 10/40 & diltiazem 240mg ?  No need to respond to fax.  [facility] just wants to make sure that MD is aware of ?duplication Shary Decamp  November 11, 2009 8:52 AM   Follow-up for Phone Call        it is unusual but yes, he needs both Elijha Dedman E. Lesia Monica MD  November 11, 2009 2:01 PM

## 2010-11-10 NOTE — Medication Information (Signed)
Summary: rov/sp   Anticoagulant Therapy  Managed by: Reina Fuse, PharmD Referring MD: Ladona Ridgel PCP: Nolon Rod. Paz MD Supervising MD: Jens Som MD, Arlys Jaxsyn Indication 1: Atrial Fibrillation (ICD-427.31) Lab Used: [facility] Site: [facility] INR POC 2.6 INR RANGE 2 - 3  Dietary changes: no    Health status changes: no    Bleeding/hemorrhagic complications: no    Recent/future hospitalizations: no    Any changes in medication regimen? no    Recent/future dental: no  Any missed doses?: no       Is patient compliant with meds? yes       Allergies: 1)  ! * Cialis  Anticoagulation Management History:      The patient is taking warfarin and comes in today for a routine follow up visit.  Positive risk factors for bleeding include an age of 70 years or older and presence of serious comorbidities.  The bleeding index is 'intermediate risk'.  Positive CHADS2 values include History of CHF, History of HTN, and History of Diabetes.  Negative CHADS2 values include Age > 66 years old.  The start date was 12/03/2003.  His last INR was 2.0.  Anticoagulation responsible Cornie Herrington: Jens Som MD, Arlys Jaclyn.  INR POC: 2.6.  Cuvette Lot#: [id number].  Exp: 08/2011.    Anticoagulation Management Assessment/Plan:      The patient's current anticoagulation dose is Coumadin 5 mg tabs: Take as directed by coumadin clinic..  The target INR is 2 - 3.  The next INR is due 09/18/2010.  Anticoagulation instructions were given to patient.  Results were reviewed/authorized by Reina Fuse, PharmD.  He was notified by Reina Fuse PharmD.         Prior Anticoagulation Instructions: INR 2.1  Continue same dose of 1 tablet every day.  Recheck INR in 4 weeks.   Current Anticoagulation Instructions: INR 2.6  Continue taking Coumadin 1 tab (5 mg) every day. Return to clinic in 4 weeks.

## 2010-11-10 NOTE — Assessment & Plan Note (Signed)
Summary: infection in foot/kdc ok per [facility]   Vital Signs:  Patient profile:   70 year old male Height:      69 inches Weight:      231.2 pounds Temp:     98.1 degrees F BP sitting:   130 / 80  Vitals Entered By: Shary Decamp (October 23, 2009 11:05 AM) CC: infection in middle toe on rt foot   History of Present Illness: symptoms started with itching at the right toes  4 days ago 3 days ago ,  noted a minute  amount of discharge from the third right toe  Allergies: 1)  ! * Cialis  Past History:  Past Medical History: Reviewed history from 07/11/2009 and no changes required. HYPERTENSION (at some point hard to control, saw Dr Lowell Guitar) [name]  AODM dx 1999 ATRIAL FIBRILLATION, PAROXYSMAL    03-2007.Marland KitchenMarland KitchenMarland KitchenStress test neg BENIGN PROSTATIC HYPERTROPHY--saw Dr [name] 2004 , normal renal u/s INSOMNIA, CHRONIC   2008: h/o  recurrent  loculated right plural effusion, s/p eval Dr [name] ,  Dr Laneta Simmers. W/U included thoracentesis           Social History: Reviewed history from 04/15/2009 and no changes required. Married 1 son moved from [place]  He lives with his wife.  No smoking for last 30 years.   Last alcohol was 1 beer last week at his son's wedding.  No drugs.   Review of Systems       denies fever no claudication mild swelling (limited to the   toes)  Physical Exam  General:  well-developed.  well-developed.   Pulses:  right pedal pulse normal Extremities:  no lower extremity edema right foot: Minimal swelling in the second third and fourth toe some maceration between the toes the third toe has a 3-mm wound which is oozing yellow transparent  discharge   Impression & Recommendations:  Problem # 1:  DERMATOPHYTOSIS OF FOOT (ICD-110.4) fungal infection complicated by a secondary bacterial infection Plan: Doxycycline ketoconazole cream to both foot patient to make  the  Coumadin clinic aware  of doxycycline treatment, they may elect to check  his  INR sooner patient to call promptly if he is not improving recheck in 3 weeks His updated medication list for this problem includes:    Ketoconazole 2 % Crea (Ketoconazole) .Marland Kitchen... Apply two times a day betwen the toes x 10 days (both foot)  His updated medication list for this problem includes:    Ketoconazole 2 % Crea (Ketoconazole) .Marland Kitchen... Apply two times a day betwen the toes x 10 days (both foot)  Orders: Specimen Handling (54098) T-Culture, Wound ([id number])  Complete Medication List: 1)  Glyburide 5 Mg Tabs (Glyburide) .... 4 by mouth once daily 2)  Glucophage 500 Mg Tabs (Metformin hcl) .Marland Kitchen.. 1 by mouth two times a day 3)  Januvia 100 Mg Tabs (Sitagliptin phosphate) .Marland Kitchen.. 1 by mouth qd 4)  Metoprolol Tartrate 25 Mg Tabs (Metoprolol tartrate) .Marland Kitchen.. 1 by mouth bid 5)  Cozaar 100 Mg Tabs (Losartan potassium) .... Take 1 tablet by mouth once a day 6)  Lotrel 10-40 Mg Caps (Amlodipine besy-benazepril hcl) .Marland Kitchen.. 1 by mouth qd 7)  Cartia Xt 240 Mg Cp24 (Diltiazem hcl coated beads) .... Take 1 capsule by mouth once a day 8)  Furosemide 20 Mg Tabs (Furosemide) .... Take 1-2 tablet by mouth once a day 9)  Digoxin 0.25 Mg Tabs (Digoxin) .... Take 1 tablet by mouth once a day 10)  Advicor 1000-20 Mg Tb24 (  Niacin-lovastatin) .... Take 1 tablet by mouth once a day 11)  Flomax 0.4 Mg Cp24 (Tamsulosin hcl) .Marland Kitchen.. 1 capsule once a day 12)  Ambien 10 Mg Tabs (Zolpidem tartrate) .... One p.o. nightly p.r.n. insomnia 13)  Aspirin 325 Mg Tabs (Aspirin) .Marland Kitchen.. 1 tab by mouth once daily 14)  Multivitamins Tabs (Multiple vitamin) .Marland Kitchen.. 1 tab by mouth once daily 15)  Mucinex 600 Mg Tb12 (Guaifenesin) .... Prn 16)  Ascensia Contour Test Strips  .... Checks blood sugar two times a day dx 250.00 17)  Coumadin 5 Mg Tabs (Warfarin sodium) .... Take as directed by coumadin clinic. 18)  Ketoconazole 2 % Crea (Ketoconazole) .... Apply two times a day betwen the toes x 10 days (both foot) 19)  Doxycycline Hyclate  100 Mg Caps (Doxycycline hyclate) .Marland Kitchen.. 1 by mouth two times a day  Patient Instructions: 1)  call any time if symptoms worsen 2)  f/u in 3 weeks  Prescriptions: DOXYCYCLINE HYCLATE 100 MG CAPS (DOXYCYCLINE HYCLATE) 1 by mouth two times a day  #20 x 0   Entered and Authorized by:   Nolon Rod. Teng Decou MD   Signed by:   Nolon Rod. Andrik Sandt MD on 10/23/2009   Method used:   [facility] to        [facility] [phone]* (retail)       [address]       [place], [place]  [address]       Ph: [phone]       Fax: [phone]   RxID:   [id number] KETOCONAZOLE 2 % CREA (KETOCONAZOLE) apply two times a day betwen the toes x 10 days (both foot)  #1 x 0   Entered and Authorized by:   Nolon Rod. Eulene Pekar MD   Signed by:   Nolon Rod. Airel Magadan MD on 10/23/2009   Method used:   Electronically to        [facility] [phone]* (retail)       [address]       [place], [place]  [address]       Ph: [phone]       Fax: [phone]   RxID:   [id number]

## 2010-11-10 NOTE — Assessment & Plan Note (Signed)
Summary: 3 month roa//lch   Vital Signs:  Patient profile:   70 year old male Weight:      226.38 pounds Pulse rate:   101 / minute Pulse rhythm:   regular BP sitting:   132 / 82  (left arm) Cuff size:   large  Vitals Entered By: [name] CMA (May 13, 2010 10:23 AM) CC: 4 month f/u, no complaints. Fasting   History of Present Illness: routine office visit Was seen recently with contact dermatitis, much improved  ROS Ambulatory BP is within normal limits His diet is very good, he exercise once a week at the gym denies any dysuria, hematuria, difficulty urinating.  Current Medications (verified): 1)  Glyburide 5 Mg Tabs (Glyburide) .... 4 By Mouth Once Daily 2)  Glucophage 850 Mg Tabs (Metformin Hcl) .... Two Times A Day 3)  Januvia 100 Mg Tabs (Sitagliptin Phosphate) .Marland Kitchen.. 1 By Mouth Qd 4)  Metoprolol Tartrate 25 Mg  Tabs (Metoprolol Tartrate) .Marland Kitchen.. 1 By Mouth Bid 5)  Cozaar 100 Mg Tabs (Losartan Potassium) .... Take 1 Tablet By Mouth Once A Day 6)  Lotrel 10-40 Mg Caps (Amlodipine Besy-Benazepril Hcl) .Marland Kitchen.. 1 By Mouth Qd 7)  Cartia Xt 240 Mg Cp24 (Diltiazem Hcl Coated Beads) .... Take 1 Capsule By Mouth Once A Day 8)  Furosemide 20 Mg Tabs (Furosemide) .... Take 1-2 Tablet By Mouth Once A Day 9)  Digoxin 0.25 Mg Tabs (Digoxin) .... Take 1 Tablet By Mouth Once A Day 10)  Advicor 1000-20 Mg Tb24 (Niacin-Lovastatin) .... Take 1 Tablet By Mouth Once A Day 11)  Ambien 10 Mg  Tabs (Zolpidem Tartrate) .... One P.o. Nightly P.r.n. Insomnia 12)  Aspirin 325 Mg Tabs (Aspirin) .Marland Kitchen.. 1 Tab By Mouth Once Daily 13)  Multivitamins  Tabs (Multiple Vitamin) .Marland Kitchen.. 1 Tab By Mouth Once Daily 14)  Mucinex 600 Mg Tb12 (Guaifenesin) .... Prn 15)  Ascensia Contour Test Strips .... Checks Blood Sugar Two Times A Day Dx 250.00 16)  Coumadin 5 Mg Tabs (Warfarin Sodium) .... Take As Directed By Coumadin Clinic. 17)  Viagra 100 Mg Tabs (Sildenafil Citrate) .... One By Mouth Daily As Needed 18)   Hydrocortisone 2.5 % Crea (Hydrocortisone) .... Applied 3 Times A Day To The Neck For 5 Days 19)  Cephalexin 500 Mg Caps (Cephalexin) .... One By Mouth Every 6 Hours For 5 Days  Allergies: 1)  ! * Cialis  Past History:  Past Medical History: Reviewed history from 11/11/2009 and no changes required. HYPERTENSION (at some point hard to control, saw Dr Lowell Guitar) [name]  AODM dx 1999 ATRIAL FIBRILLATION, PAROXYSMAL    03-2007.Marland KitchenMarland KitchenMarland KitchenStress test neg BENIGN PROSTATIC HYPERTROPHY--saw Dr [name] 2004 , normal renal u/s INSOMNIA, CHRONIC  2008: h/o  recurrent  loculated right plural effusion, s/p eval Dr [name] ,  Dr Laneta Simmers. W/U included thoracentesis           Past Surgical History: Reviewed history from 11/11/2009 and no changes required.  Status post large pneumothorax with fibrothorax.   Status post decortication   Status post cardiac catheterization May 09, 2008   Social History: Reviewed history from 11/11/2009 and no changes required. Married 1 son moved from [place] He lives with his wife.   No smoking for last 30 years. (used to smoke 2.5 ppd) Last alcohol was 1 beer last week at his son's wedding.   No drugs.   Physical Exam  General:  alert, well-developed, and well-nourished.   Lungs:  normal respiratory effort, no  intercostal retractions, no accessory muscle use, and normal breath sounds.   Heart:  irregularly irregular Pulses:  right pedal pulse normal Extremities:  trace pretibial edema  Diabetes Management Exam:    Foot Exam (with socks and/or shoes not present):       Sensory-Pinprick/Light touch:          Left medial foot (L-4): normal          Left dorsal foot (L-5): normal          Left lateral foot (S-1): normal          Right medial foot (L-4): normal          Right dorsal foot (L-5): normal          Right lateral foot (S-1): normal       Sensory-Monofilament:          Left foot: normal          Right foot: normal       Inspection:           Left foot: normal          Right foot: normal       Nails:          Left foot: normal          Right foot: thickened   Impression & Recommendations:  Problem # 1:  PSA, INCREASED (ICD-790.93) PSA noted to be 5.5 on  11-2009, DRE was normal. Patient asymptomatic Recheck a PSA Orders: Venipuncture (09811) TLB-PSA (Prostate Specific Antigen) (84153-PSA) Specimen Handling (91478)  Problem # 2:  HYPERLIPIDEMIA (ICD-272.4) due for labs His updated medication list for this problem includes:    Advicor 1000-20 Mg Tb24 (Niacin-lovastatin) .Marland Kitchen... Take 1 tablet by mouth once a day  Orders: TLB-Lipid Panel (80061-LIPID) Specimen Handling (29562)  Labs Reviewed: SGOT: 21 (02/09/2010)   SGPT: 31 (02/09/2010)   HDL:38.80 (07/11/2009), 37.3 (04/11/2008)  LDL:84 (07/11/2009), 68 (04/11/2008)  Chol:157 (07/11/2009), 121 (04/11/2008)  Trig:169.0 (07/11/2009), 81 (04/11/2008)  Problem # 3:  AODM (ICD-250.00) seems to  be doing well, labs His updated medication list for this problem includes:    Glyburide 5 Mg Tabs (Glyburide) .Marland KitchenMarland KitchenMarland KitchenMarland Kitchen 4 by mouth once daily    Glucophage 850 Mg Tabs (Metformin hcl) .Marland Kitchen..Marland Kitchen Two times a day    Januvia 100 Mg Tabs (Sitagliptin phosphate) .Marland Kitchen... 1 by mouth qd    Cozaar 100 Mg Tabs (Losartan potassium) .Marland Kitchen... Take 1 tablet by mouth once a day    Lotrel 10-40 Mg Caps (Amlodipine besy-benazepril hcl) .Marland Kitchen... 1 by mouth qd    Aspirin 325 Mg Tabs (Aspirin) .Marland Kitchen... 1 tab by mouth once daily  Orders: TLB-A1C / Hgb A1C (Glycohemoglobin) (83036-A1C) TLB-BMP (Basic Metabolic Panel-BMET) (80048-METABOL) Specimen Handling (13086)  Labs Reviewed: Creat: 1.1 (11/11/2009)    Reviewed HgBA1c results: 6.3 (02/09/2010)  6.9 (11/11/2009)  Problem # 4:  HYPERTENSION (ICD-401.9) at goal  His updated medication list for this problem includes:    Metoprolol Tartrate 25 Mg Tabs (Metoprolol tartrate) .Marland Kitchen... 1 by mouth bid    Cozaar 100 Mg Tabs (Losartan potassium) .Marland Kitchen... Take 1 tablet by  mouth once a day    Lotrel 10-40 Mg Caps (Amlodipine besy-benazepril hcl) .Marland Kitchen... 1 by mouth qd    Cartia Xt 240 Mg Cp24 (Diltiazem hcl coated beads) .Marland Kitchen... Take 1 capsule by mouth once a day    Furosemide 20 Mg Tabs (Furosemide) .Marland Kitchen... Take 1-2 tablet by mouth once a day  BP today: 132/82 Prior BP: 132/80 (  04/29/2010)  Labs Reviewed: K+: 4.9 (11/11/2009) Creat: : 1.1 (11/11/2009)   Chol: 157 (07/11/2009)   HDL: 38.80 (07/11/2009)   LDL: 84 (07/11/2009)   TG: 169.0 (07/11/2009)  Complete Medication List: 1)  Glyburide 5 Mg Tabs (Glyburide) .... 4 by mouth once daily 2)  Glucophage 850 Mg Tabs (Metformin hcl) .... Two times a day 3)  Januvia 100 Mg Tabs (Sitagliptin phosphate) .Marland Kitchen.. 1 by mouth qd 4)  Metoprolol Tartrate 25 Mg Tabs (Metoprolol tartrate) .Marland Kitchen.. 1 by mouth bid 5)  Cozaar 100 Mg Tabs (Losartan potassium) .... Take 1 tablet by mouth once a day 6)  Lotrel 10-40 Mg Caps (Amlodipine besy-benazepril hcl) .Marland Kitchen.. 1 by mouth qd 7)  Cartia Xt 240 Mg Cp24 (Diltiazem hcl coated beads) .... Take 1 capsule by mouth once a day 8)  Furosemide 20 Mg Tabs (Furosemide) .... Take 1-2 tablet by mouth once a day 9)  Digoxin 0.25 Mg Tabs (Digoxin) .... Take 1 tablet by mouth once a day 10)  Advicor 1000-20 Mg Tb24 (Niacin-lovastatin) .... Take 1 tablet by mouth once a day 11)  Ambien 10 Mg Tabs (Zolpidem tartrate) .... One p.o. nightly p.r.n. insomnia 12)  Aspirin 325 Mg Tabs (Aspirin) .Marland Kitchen.. 1 tab by mouth once daily 13)  Multivitamins Tabs (Multiple vitamin) .Marland Kitchen.. 1 tab by mouth once daily 14)  Mucinex 600 Mg Tb12 (Guaifenesin) .... Prn 15)  Ascensia Contour Test Strips  .... Checks blood sugar two times a day dx 250.00 16)  Coumadin 5 Mg Tabs (Warfarin sodium) .... Take as directed by coumadin clinic. 17)  Viagra 100 Mg Tabs (Sildenafil citrate) .... One by mouth daily as needed 18)  Hydrocortisone 2.5 % Crea (Hydrocortisone) .... Applied 3 times a day to the neck for 5 days 19)  Cephalexin 500 Mg Caps  (Cephalexin) .... One by mouth every 6 hours for 5 days  Patient Instructions: 1)  Please schedule a follow-up appointment in 4 months .

## 2010-11-12 NOTE — Assessment & Plan Note (Signed)
Summary: PV EVAL LOWER ARTERIAL/10-19-10/LOWER VENOUS 10-21-10 @ 3PM/SL    Visit Type:  Initial Consult Primary Provider:  Nolon Rod. Paz MD  CC:  pain in legs.  History of Present Illness: 70 yo WM with history of CAD, atrial fibrillation, HTN, hyperlipidemia and DM who is here today for evaluation of lower extremity pain. His cardiac issues are followed by Dr. Sharrell Ku. He has had venous insufficiency for many years with stasis dermatitis and several episdoes of cellulitis. He describes pain in both calf muscles with walking. He also has pain in both legs when lying in bed. He has several ulcers over the anterior aspect of his right shin. Arterial dopplers this week with normal ABI bilaterally and brisk biphasic waveforms in both lower extremities.   Current Medications (verified): 1)  Glyburide 5 Mg Tabs (Glyburide) .... 4 By Mouth Once Daily 2)  Glucophage 850 Mg Tabs (Metformin Hcl) .... Two Times A Day 3)  Januvia 100 Mg Tabs (Sitagliptin Phosphate) .Marland Kitchen.. 1 By Mouth Qd 4)  Metoprolol Tartrate 50 Mg Tabs (Metoprolol Tartrate) .... One By Mouth Bid 5)  Cozaar 100 Mg Tabs (Losartan Potassium) .... Take 1 Tablet By Mouth Once A Day 6)  Lotrel 10-40 Mg Caps (Amlodipine Besy-Benazepril Hcl) .Marland Kitchen.. 1 By Mouth Qd 7)  Furosemide 20 Mg Tabs (Furosemide) .... Take 1-2 Tablet By Mouth Once A Day 8)  Digoxin 0.25 Mg Tabs (Digoxin) .... Take 1 Tablet By Mouth Once A Day 9)  Advicor 1000-20 Mg Tb24 (Niacin-Lovastatin) .... Take 1 Tablet By Mouth Once A Day 10)  Ambien 10 Mg  Tabs (Zolpidem Tartrate) .... One P.o. Nightly P.r.n. Insomnia 11)  Aspirin 325 Mg Tabs (Aspirin) .Marland Kitchen.. 1 Tab By Mouth Once Daily 12)  Multivitamins  Tabs (Multiple Vitamin) .Marland Kitchen.. 1 Tab By Mouth Once Daily 13)  Mucinex 600 Mg Tb12 (Guaifenesin) .... Prn 14)  Ascensia Contour Test Strips .... Checks Blood Sugar Two Times A Day Dx 250.00 15)  Coumadin 5 Mg Tabs (Warfarin Sodium) .... Take As Directed By Coumadin Clinic. 16)  Mupirocin 2  % Oint (Mupirocin) .... Uad 17)  Clobetasol Propionate 0.05 % Crea (Clobetasol Propionate) .... Uad  Allergies: 1)  ! * Cialis  Past History:  Past Medical History: Reviewed history from 09/14/2010 and no changes required. HYPERTENSION (at some point hard to control, saw Dr Lowell Guitar) [name]  AODM dx 1999 ATRIAL FIBRILLATION, PAROXYSMAL   (Dr Ladona Ridgel) 03-2007.Marland KitchenMarland KitchenMarland KitchenStress test neg, ECHO 55% 04-2007: carciac cath w/ no high grade stenosis, +  P-HTN BENIGN PROSTATIC HYPERTROPHY--saw Dr [name] 2004 , normal renal u/s INSOMNIA, CHRONIC  2008: h/o  recurrent  loculated right plural effusion, s/p eval Dr [name] ,  Dr Laneta Simmers. W/U included thoracentesis    Past Surgical History:  Status post large pneumothorax with fibrothorax.   Status post lung decortication -Dr. Laneta Simmers  Status post cardiac catheterization    Family History: Reviewed history from 01/06/2009 and no changes required. MI-- no DM-- F Colon ca-- no prostate ca--no  Mother-deceased, rare blood disease Father-deceased, DM 1 brother-healthy 1 sister-healthy  Social History: Reviewed history from 11/11/2009 and no changes required. Married 1 son moved from [place] He lives with his wife.   No smoking for last 30 years. (used to smoke 2.5 ppd for 25 years) Last alcohol was 1 beer last week at his son's wedding.   No drugs.   Review of Systems  The patient denies fatigue, malaise, fever, weight gain/loss, vision loss, decreased hearing, hoarseness, chest pain, palpitations,  shortness of breath, prolonged cough, wheezing, sleep apnea, coughing up blood, abdominal pain, blood in stool, nausea, vomiting, diarrhea, heartburn, incontinence, blood in urine, muscle weakness, joint pain, leg swelling, rash, skin lesions, headache, fainting, dizziness, depression, anxiety, enlarged lymph nodes, easy bruising or bleeding, and environmental allergies.         See HPI. Pain in both legs with swelling and discoloration.    Vital Signs:  Patient profile:   70 year old male Height:      69 inches Weight:      231 pounds BMI:     34.24 Pulse rate:   72 / minute BP sitting:   130 / 80  (left arm)  Vitals Entered By: Kem Parkinson (October 22, 2010 2:12 PM)  Physical Exam  General:  General: Well developed, well nourished, NAD HEENT: OP clear, mucus membranes moist SKIN: warm, dry Neuro: No focal deficits Musculoskeletal: Muscle strength 5/5 all ext Psychiatric: Mood and affect normal Neck: No JVD, no carotid bruits, no thyromegaly, no lymphadenopathy. Lungs:Clear bilaterally, no wheezes, rhonci, crackles CV: RRR no murmurs, gallops rubs Abdomen: soft, NT, ND, BS present Extremities: Trace bilateral lower ext edema. Venous stasis skin discoloration over both lower ext from knees down to feet. Pulses 2+ DP/PT. Healing ulceration over anterior aspect of right shin.     ABI's  Procedure date:  10/19/2010  Findings:      ABI 1.2 on right and 1.3 on the left.   Impression & Recommendations:  Problem # 1:  DERMATITIS, STASIS (ICD-454.1) He has no evidence of peripheral arterial disease. His edema and skin changes appear to be consistent with venous insufficiency with stasis dermatitis. He is instructed to elevate his legs as often as possible. He will continue Lasix as needed for control of edema. I will refer him to one of our local vein specialists Dr. Cher Nakai Early with [facility].  Follow up with me as needed.   Other Orders: [facility] Referral ([facility] Ref)  Patient Instructions: 1)  Your physician recommends that you schedule a follow-up appointment as needed. 2)  Your physician recommends that you continue on your current medications as directed. Please refer to the Current Medication list given to you today. 3)  You have been referred to Dr. Tawanna Cooler Early with [facility].

## 2010-11-12 NOTE — Medication Information (Signed)
Summary: rov/sp   Anticoagulant Therapy  Managed by: Louann Sjogren, PharmD Referring MD: Ladona Ridgel PCP: Nolon Rod. Paz MD Supervising MD: Tenny Craw MD,Paula Indication 1: Atrial Fibrillation (ICD-427.31) Lab Used: [facility] Site: [facility] INR POC 2.4 INR RANGE 2 - 3  Dietary changes: no    Health status changes: no    Bleeding/hemorrhagic complications: no    Recent/future hospitalizations: no    Any changes in medication regimen? yes       Details: Switched from metoprolol succinate to metoprolol tartrate  Recent/future dental: no  Any missed doses?: no       Is patient compliant with meds? yes       Allergies: 1)  ! * Cialis  Anticoagulation Management History:      The patient is taking warfarin and comes in today for a routine follow up visit.  Positive risk factors for bleeding include an age of 70 years or older and presence of serious comorbidities.  The bleeding index is 'intermediate risk'.  Positive CHADS2 values include History of CHF, History of HTN, and History of Diabetes.  Negative CHADS2 values include Age > 71 years old.  The start date was 12/03/2003.  His last INR was 2.0 and today's INR is 2.4.  Anticoagulation responsible provider: Tenny Craw MD,Paula.  INR POC: 2.4.  Cuvette Lot#: [id number].  Exp: 11/2011.    Anticoagulation Management Assessment/Plan:      The patient's current anticoagulation dose is Coumadin 5 mg tabs: Take as directed by coumadin clinic..  The target INR is 2 - 3.  The next INR is due 11/13/2010.  Anticoagulation instructions were given to patient.  Results were reviewed/authorized by Louann Sjogren, PharmD.  He was notified by Louann Sjogren PharmD.         Prior Anticoagulation Instructions: INR 2.8  Continue same dose of 1 tablet every day.  Recheck INR in 4 weeks.   Current Anticoagulation Instructions: INR 2.4 (goal 2-3)  Continue taking 1 tablet everyday.

## 2010-11-12 NOTE — Consult Note (Signed)
Summary: LE ulcer, Rx duderm, temovate--- Dermatology  Gae Dry MD Dermatology   Imported By: Lanelle Bal 10/16/2010 10:43:03  _____________________________________________________________________  External Attachment:    Type:   Image     Comment:   External Document

## 2010-11-12 NOTE — Progress Notes (Signed)
Summary: CT of chest  Phone Note Call from Patient Call back at Home Phone [phone]   Summary of Call: Patient spouse called to inquire about CT scan appt. I am not sure why the order did not save, but I have entered it. Please call her today at home. Initial call taken by: Lucious Groves CMA,  September 25, 2010 11:16 AM  Follow-up for Phone Call        Should test be ordered with w/o, with, without contrast? Magdalen Spatz [facility]  September 25, 2010 11:33 AM   Without. Thanks. Lucious Groves CMA  September 25, 2010 11:38 AM   Additional Follow-up for Phone Call Additional follow up Details #1::        PT APPT IS 09-28-2010, I INFORMED PT. Magdalen Spatz [facility]  September 25, 2010 12:05 PM

## 2010-11-12 NOTE — Assessment & Plan Note (Signed)
Summary: PER CHECK OUT/SF  Medications Added METOPROLOL TARTRATE 50 MG TABS (METOPROLOL TARTRATE) one by mouth bid MUPIROCIN 2 % OINT (MUPIROCIN) UAD CLOBETASOL PROPIONATE 0.05 % CREA (CLOBETASOL PROPIONATE) UAD        Visit Type:  Follow-up Primary Gautam Langhorst:  Nolon Rod. Paz MD   History of Present Illness: Bradley [name] returns today for followup.  He is a pleasant 70 yo man with a h/o atrial fib with a controlled VR, HTN, DM, and chronic coumadin therapy.  The patient has done well since I saw him except for continued problems with his lower extremity circulation and dyspnea.  A recent CXR followed by chest CT demonstrated small nodules and COPD/atelectasis.  No other complaints. He does note mild peripheral edema.  Current Medications (verified): 1)  Glyburide 5 Mg Tabs (Glyburide) .... 4 By Mouth Once Daily 2)  Glucophage 850 Mg Tabs (Metformin Hcl) .... Two Times A Day 3)  Januvia 100 Mg Tabs (Sitagliptin Phosphate) .Marland Kitchen.. 1 By Mouth Qd 4)  Metoprolol Tartrate 25 Mg  Tabs (Metoprolol Tartrate) .Marland Kitchen.. 1 By Mouth Bid 5)  Cozaar 100 Mg Tabs (Losartan Potassium) .... Take 1 Tablet By Mouth Once A Day 6)  Lotrel 10-40 Mg Caps (Amlodipine Besy-Benazepril Hcl) .Marland Kitchen.. 1 By Mouth Qd 7)  Cartia Xt 240 Mg Cp24 (Diltiazem Hcl Coated Beads) .... Take 1 Capsule By Mouth Once A Day 8)  Furosemide 20 Mg Tabs (Furosemide) .... Take 1-2 Tablet By Mouth Once A Day 9)  Digoxin 0.25 Mg Tabs (Digoxin) .... Take 1 Tablet By Mouth Once A Day 10)  Advicor 1000-20 Mg Tb24 (Niacin-Lovastatin) .... Take 1 Tablet By Mouth Once A Day 11)  Ambien 10 Mg  Tabs (Zolpidem Tartrate) .... One P.o. Nightly P.r.n. Insomnia 12)  Aspirin 325 Mg Tabs (Aspirin) .Marland Kitchen.. 1 Tab By Mouth Once Daily 13)  Multivitamins  Tabs (Multiple Vitamin) .Marland Kitchen.. 1 Tab By Mouth Once Daily 14)  Mucinex 600 Mg Tb12 (Guaifenesin) .... Prn 15)  Ascensia Contour Test Strips .... Checks Blood Sugar Two Times A Day Dx 250.00 16)  Coumadin 5 Mg Tabs (Warfarin  Sodium) .... Take As Directed By Coumadin Clinic. 17)  Mupirocin 2 % Oint (Mupirocin) .... Uad 18)  Clobetasol Propionate 0.05 % Crea (Clobetasol Propionate) .... Uad  Allergies: 1)  ! * Cialis  Past History:  Past Medical History: Last updated: 09/14/2010 HYPERTENSION (at some point hard to control, saw Dr Lowell Guitar) [name]  AODM dx 1999 ATRIAL FIBRILLATION, PAROXYSMAL   (Dr Ladona Ridgel) 03-2007.Marland KitchenMarland KitchenMarland KitchenStress test neg, ECHO 55% 04-2007: carciac cath w/ no high grade stenosis, +  P-HTN BENIGN PROSTATIC HYPERTROPHY--saw Dr [name] 2004 , normal renal u/s INSOMNIA, CHRONIC  2008: h/o  recurrent  loculated right plural effusion, s/p eval Dr [name] ,  Dr Laneta Simmers. W/U included thoracentesis    Past Surgical History: Last updated: 09/14/2010  Status post large pneumothorax with fibrothorax.   Status post decortication   Status post cardiac catheterization    Review of Systems       The patient complains of dyspnea on exertion and peripheral edema.  The patient denies chest pain and syncope.    Vital Signs:  Patient profile:   70 year old male Height:      69 inches Weight:      232 pounds BMI:     34.38 Pulse rate:   63 / minute BP sitting:   140 / 70  (left arm)  Vitals Entered By: Laurance Flatten CMA (October 08, 2010 2:44 PM)  Physical Exam  General:  alert and well-developed.   Head:  normocephalic and atraumatic Eyes:  PERRLA/EOM intact; conjunctiva and lids normal. Mouth:  Teeth, gums and palate normal. Oral mucosa normal. Neck:  no thyromegaly and normal carotid upstroke.   Lungs:  normal respiratory effort, no intercostal retractions, no accessory muscle use, and normal breath sounds.   Heart:  irregularly irregular Abdomen:  soft, non-tender, no distention, no masses, no guarding, and no rigidity.  no bruit Msk:  Back normal, normal gait. Muscle strength and tone normal. Pulses:  B  pedal pulses  normal Extremities:  trace edema B  Neurologic:  Alert and oriented x  3.   EKG  Procedure date:  10/08/2010  Findings:      Atrial fibrillation with a controlled ventricular response rate of: 63.  Impression & Recommendations:  Problem # 1:  CHF (ICD-428.0) His symptoms are class 2.  I think his calcium channel blockers may be contributing to his peripheral edema and I have asked him to try and stop his cartia and increase his metoprolol. The following medications were removed from the medication list:    Cartia Xt 240 Mg Cp24 (Diltiazem hcl coated beads) .Marland Kitchen... Take 1 capsule by mouth once a day His updated medication list for this problem includes:    Metoprolol Tartrate 50 Mg Tabs (Metoprolol tartrate) ..... One by mouth bid    Cozaar 100 Mg Tabs (Losartan potassium) .Marland Kitchen... Take 1 tablet by mouth once a day    Lotrel 10-40 Mg Caps (Amlodipine besy-benazepril hcl) .Marland Kitchen... 1 by mouth qd    Furosemide 20 Mg Tabs (Furosemide) .Marland Kitchen... Take 1-2 tablet by mouth once a day    Digoxin 0.25 Mg Tabs (Digoxin) .Marland Kitchen... Take 1 tablet by mouth once a day    Aspirin 325 Mg Tabs (Aspirin) .Marland Kitchen... 1 tab by mouth once daily    Coumadin 5 Mg Tabs (Warfarin sodium) .Marland Kitchen... Take as directed by coumadin clinic.  Problem # 2:  ATRIAL FIBRILLATION, PAROXYSMAL (ICD-427.31) His ventricular rate is well controlled.  I have asked him to stop his cardizem and increase metoprolol. His updated medication list for this problem includes:    Metoprolol Tartrate 50 Mg Tabs (Metoprolol tartrate) ..... One by mouth bid    Digoxin 0.25 Mg Tabs (Digoxin) .Marland Kitchen... Take 1 tablet by mouth once a day    Aspirin 325 Mg Tabs (Aspirin) .Marland Kitchen... 1 tab by mouth once daily    Coumadin 5 Mg Tabs (Warfarin sodium) .Marland Kitchen... Take as directed by coumadin clinic.  Problem # 3:  DERMATITIS, STASIS (ICD-454.1) He has evidence of venous insufficiency and I am also concerned about peripheral vascular disease and I have asked him to obtain lower extremity dopplers.  He will follow up with one of our peripheral vasc  doctors.  Other Orders: Venous Duplex Lower Extremity (Venous Duplex Lower) Arterial Duplex Lower Extremity (Arterial Duplex Low)  Patient Instructions: 1)  Your physician recommends that you schedule a follow-up appointment in: 8 weeks Dr Ladona Ridgel 2)  Your physician has recommended you make the following change in your medication: stop Cartia 3)  increase Metoprolol to 50mg  two times a day 4)  Your physician has requested that you have a lower or upper extremity arterial duplex.  This test is an ultrasound of the arteries in the legs or arms.  It looks at arterial blood flow in the legs and arms.  Allow one hour for Lower and Upper Arterial scans. There are no restrictions or special instructions.  5)  Your physician has requested that you have a lower or upper extremity venous duplex.  This test is an ultrasound of the veins in the legs or arms.  It looks at venous blood flow that carries blood from the heart to the legs or arms.  Allow one hour for a Lower Venous exam.  Allow thirty minutes for an Upper Venous exam. There are no restrictions or special instructions. Prescriptions: METOPROLOL TARTRATE 50 MG TABS (METOPROLOL TARTRATE) one by mouth bid  #180 x 3   Entered by:   Dennis Bast, RN, BSN   Authorized by:   Laren Boom, MD, [facility]   Signed by:   Dennis Bast, RN, BSN on 10/08/2010   Method used:   Print then Give to Patient   RxID:   [id number] METOPROLOL TARTRATE 25 MG  TABS (METOPROLOL TARTRATE) 1 by mouth bid  #180 x 3   Entered by:   Dennis Bast, RN, BSN   Authorized by:   Laren Boom, MD, [facility]   Signed by:   Dennis Bast, RN, BSN on 10/08/2010   Method used:   Electronically to        [facility] [phone]* (retail)       [address]       [place], [place]  [address]       Ph: [phone]       Fax: [phone]   RxID:   [id number]

## 2010-11-12 NOTE — Miscellaneous (Signed)
Summary: Orders Update  Clinical Lists Changes  Orders: Added new Test order of Arterial Duplex Lower Extremity (Arterial Duplex Low) - Signed 

## 2010-11-12 NOTE — Assessment & Plan Note (Signed)
Summary: PV referal

## 2010-11-13 ENCOUNTER — Ambulatory Visit: Admit: 2010-11-13 | Payer: Self-pay

## 2010-11-13 ENCOUNTER — Encounter: Payer: Self-pay | Admitting: Cardiology

## 2010-11-13 ENCOUNTER — Encounter (INDEPENDENT_AMBULATORY_CARE_PROVIDER_SITE_OTHER): Payer: Medicare Other

## 2010-11-13 DIAGNOSIS — Z7901 Long term (current) use of anticoagulants: Secondary | ICD-10-CM

## 2010-11-13 DIAGNOSIS — I4891 Unspecified atrial fibrillation: Secondary | ICD-10-CM

## 2010-11-13 LAB — CONVERTED CEMR LAB: POC INR: 2.1

## 2010-11-18 NOTE — Medication Information (Signed)
Summary: rov/ewj  Anticoagulant Therapy  Managed by: Bethena Midget, RN, BSN Referring MD: Ladona Ridgel PCP: Nolon Rod. Paz MD Supervising MD: Jens Som MD, Arlys Goran Indication 1: Atrial Fibrillation (ICD-427.31) Lab Used: [facility] Site: [facility] INR POC 2.1 INR RANGE 2 - 3  Dietary changes: no    Health status changes: no    Bleeding/hemorrhagic complications: no    Recent/future hospitalizations: no    Any changes in medication regimen? no    Recent/future dental: no  Any missed doses?: no       Is patient compliant with meds? yes       Allergies: 1)  ! * Cialis  Anticoagulation Management History:      The patient is taking warfarin and comes in today for a routine follow up visit.  Positive risk factors for bleeding include an age of 60 years or older and presence of serious comorbidities.  The bleeding index is 'intermediate risk'.  Positive CHADS2 values include History of CHF, History of HTN, and History of Diabetes.  Negative CHADS2 values include Age > 36 years old.  The start date was 12/03/2003.  His last INR was 2.4.  Anticoagulation responsible provider: Jens Som MD, Arlys Itay.  INR POC: 2.1.  Cuvette Lot#: [id number].  Exp: 10/2011.    Anticoagulation Management Assessment/Plan:      The patient's current anticoagulation dose is Coumadin 5 mg tabs: Take as directed by coumadin clinic..  The target INR is 2 - 3.  The next INR is due 12/11/2010.  Anticoagulation instructions were given to patient.  Results were reviewed/authorized by Bethena Midget, RN, BSN.  He was notified by Bethena Midget, RN, BSN.         Prior Anticoagulation Instructions: INR 2.4 (goal 2-3)  Continue taking 1 tablet everyday.  Current Anticoagulation Instructions: INR 2.1 Continue 5mg s everyday. Recheck in 4 weeks.

## 2010-11-24 DIAGNOSIS — Z7901 Long term (current) use of anticoagulants: Secondary | ICD-10-CM | POA: Insufficient documentation

## 2010-11-24 DIAGNOSIS — I4891 Unspecified atrial fibrillation: Secondary | ICD-10-CM

## 2010-12-02 NOTE — Consult Note (Signed)
Summary: New Pt Consultation Report  New Pt Consultation Report   Imported By: Earl Many 11/17/2010 15:31:00  _____________________________________________________________________  External Attachment:    Type:   Image     Comment:   External Document

## 2010-12-02 NOTE — Consult Note (Signed)
Summary: [facility]  [facility]   Imported By: Sherian Rein 11/17/2010 13:55:10  _____________________________________________________________________  External Attachment:    Type:   Image     Comment:   External Document

## 2010-12-03 ENCOUNTER — Ambulatory Visit (INDEPENDENT_AMBULATORY_CARE_PROVIDER_SITE_OTHER): Payer: Medicare Other | Admitting: Internal Medicine

## 2010-12-03 ENCOUNTER — Encounter: Payer: Self-pay | Admitting: Internal Medicine

## 2010-12-03 DIAGNOSIS — I4891 Unspecified atrial fibrillation: Secondary | ICD-10-CM

## 2010-12-03 DIAGNOSIS — I10 Essential (primary) hypertension: Secondary | ICD-10-CM

## 2010-12-08 NOTE — Assessment & Plan Note (Signed)
Summary: 8 wk fu/sl  Medications Added MUCINEX DM 30-600 MG XR12H-TAB (DEXTROMETHORPHAN-GUAIFENESIN) as needed ROBITUSSIN DM SUGAR FREE 100-10 MG/5ML SYRP (DEXTROMETHORPHAN-GUAIFENESIN) as needed        Primary Provider:  Nolon Rod. Paz MD   History of Present Illness: Bradley [name] returns today for followup of atrial fib, HTN, and chronic peripheral edema.  He has a h/o venous insufficiency and has chronic lower extremity swelling. He has used tourniquets to help keep the swelling under control. No c/p. He has minimal palpitations and class 2 dyspnea with exertion. No syncope.  Current Medications (verified): 1)  Glyburide 5 Mg Tabs (Glyburide) .... 4 By Mouth Once Daily 2)  Glucophage 850 Mg Tabs (Metformin Hcl) .... Two Times A Day 3)  Januvia 100 Mg Tabs (Sitagliptin Phosphate) .Marland Kitchen.. 1 By Mouth Qd 4)  Metoprolol Tartrate 50 Mg Tabs (Metoprolol Tartrate) .... One By Mouth Bid 5)  Cozaar 100 Mg Tabs (Losartan Potassium) .... Take 1 Tablet By Mouth Once A Day 6)  Lotrel 10-40 Mg Caps (Amlodipine Besy-Benazepril Hcl) .Marland Kitchen.. 1 By Mouth Qd 7)  Furosemide 20 Mg Tabs (Furosemide) .... Take 1-2 Tablet By Mouth Once A Day 8)  Digoxin 0.25 Mg Tabs (Digoxin) .... Take 1 Tablet By Mouth Once A Day 9)  Advicor 1000-20 Mg Tb24 (Niacin-Lovastatin) .... Take 1 Tablet By Mouth Once A Day 10)  Ambien 10 Mg  Tabs (Zolpidem Tartrate) .... One P.o. Nightly P.r.n. Insomnia 11)  Aspirin 325 Mg Tabs (Aspirin) .Marland Kitchen.. 1 Tab By Mouth Once Daily 12)  Multivitamins  Tabs (Multiple Vitamin) .Marland Kitchen.. 1 Tab By Mouth Once Daily 13)  Mucinex Dm 30-600 Mg Xr12h-Tab (Dextromethorphan-Guaifenesin) .... As Needed 14)  Ascensia Contour Test Strips .... Checks Blood Sugar Two Times A Day Dx 250.00 15)  Coumadin 5 Mg Tabs (Warfarin Sodium) .... Take As Directed By Coumadin Clinic. 16)  Mupirocin 2 % Oint (Mupirocin) .... Uad 17)  Clobetasol Propionate 0.05 % Crea (Clobetasol Propionate) .... Uad 18)  Robitussin Dm Sugar Free 100-10  Mg/45ml Syrp (Dextromethorphan-Guaifenesin) .... As Needed  Allergies: 1)  ! * Cialis  Past History:  Past Medical History: Last updated: 09/14/2010 HYPERTENSION (at some point hard to control, saw Dr Lowell Guitar) [name]  AODM dx 1999 ATRIAL FIBRILLATION, PAROXYSMAL   (Dr Ladona Ridgel) 03-2007.Marland KitchenMarland KitchenMarland KitchenStress test neg, ECHO 55% 04-2007: carciac cath w/ no high grade stenosis, +  P-HTN BENIGN PROSTATIC HYPERTROPHY--saw Dr [name] 2004 , normal renal u/s INSOMNIA, CHRONIC  2008: h/o  recurrent  loculated right plural effusion, s/p eval Dr [name] ,  Dr Laneta Simmers. W/U included thoracentesis    Past Surgical History: Last updated: 10/22/2010  Status post large pneumothorax with fibrothorax.   Status post lung decortication -Dr. Laneta Simmers  Status post cardiac catheterization    Review of Systems  The patient denies chest pain, syncope, dyspnea on exertion, and peripheral edema.    Vital Signs:  Patient profile:   70 year old male Height:      69 inches Weight:      233 pounds BMI:     34.53 Pulse rate:   76 / minute BP sitting:   144 / 70  (left arm)  Vitals Entered By: Laurance Flatten CMA (December 03, 2010 2:23 PM)  Physical Exam  General:  General: Well developed, well nourished, NAD HEENT: OP clear, mucus membranes moist SKIN: warm, dry Neuro: No focal deficits Musculoskeletal: Muscle strength 5/5 all ext Psychiatric: Mood and affect normal Neck: No JVD, no carotid bruits, no thyromegaly, no lymphadenopathy. Lungs:Clear  bilaterally, no wheezes, rhonci, crackles CV: RRR no murmurs, gallops rubs Abdomen: soft, NT, ND, BS present Extremities: Trace bilateral lower ext edema. Venous stasis skin discoloration over both lower ext from knees down to feet. Pulses 2+ DP/PT. Healing ulceration over anterior aspect of right shin.     Impression & Recommendations:  Problem # 1:  ULCER, LEG (ICD-707.10) His peripheral edema is improved and his ulcer is healing.  He will use additional lasix as  needed along with careful low sodium diet.  Problem # 2:  CHF (ICD-428.0) His symptoms are well controlled. He will continue meds as below. His updated medication list for this problem includes:    Metoprolol Tartrate 50 Mg Tabs (Metoprolol tartrate) ..... One by mouth bid    Cozaar 100 Mg Tabs (Losartan potassium) .Marland Kitchen... Take 1 tablet by mouth once a day    Lotrel 10-40 Mg Caps (Amlodipine besy-benazepril hcl) .Marland Kitchen... 1 by mouth qd    Furosemide 20 Mg Tabs (Furosemide) .Marland Kitchen... Take 1-2 tablet by mouth once a day    Digoxin 0.25 Mg Tabs (Digoxin) .Marland Kitchen... Take 1 tablet by mouth once a day    Aspirin 325 Mg Tabs (Aspirin) .Marland Kitchen... 1 tab by mouth once daily    Coumadin 5 Mg Tabs (Warfarin sodium) .Marland Kitchen... Take as directed by coumadin clinic.  Problem # 3:  ATRIAL FIBRILLATION, PAROXYSMAL (ICD-427.31) His symptoms are well controlled even though he now has chronic atrial fib. His updated medication list for this problem includes:    Metoprolol Tartrate 50 Mg Tabs (Metoprolol tartrate) ..... One by mouth bid    Digoxin 0.25 Mg Tabs (Digoxin) .Marland Kitchen... Take 1 tablet by mouth once a day    Aspirin 325 Mg Tabs (Aspirin) .Marland Kitchen... 1 tab by mouth once daily    Coumadin 5 Mg Tabs (Warfarin sodium) .Marland Kitchen... Take as directed by coumadin clinic.  Patient Instructions: 1)  Your physician wants you to follow-up in: 12 months with Dr Court Joy will receive a reminder letter in the mail two months in advance. If you don't receive a letter, please call our office to schedule the follow-up appointment. 2)  Your physician recommends that you continue on your current medications as directed. Please refer to the Current Medication list given to you today.

## 2010-12-11 ENCOUNTER — Encounter (INDEPENDENT_AMBULATORY_CARE_PROVIDER_SITE_OTHER): Payer: Medicare Other

## 2010-12-11 ENCOUNTER — Encounter: Payer: Self-pay | Admitting: Cardiovascular Disease

## 2010-12-11 DIAGNOSIS — I4891 Unspecified atrial fibrillation: Secondary | ICD-10-CM

## 2010-12-11 DIAGNOSIS — Z7901 Long term (current) use of anticoagulants: Secondary | ICD-10-CM

## 2010-12-11 LAB — CONVERTED CEMR LAB: POC INR: 2.4

## 2010-12-14 ENCOUNTER — Ambulatory Visit
Admission: RE | Admit: 2010-12-14 | Discharge: 2010-12-14 | Disposition: A | Discharge status: Home / Self Care | Payer: Medicare Other | Source: Ambulatory Visit | Attending: Internal Medicine | Admitting: Internal Medicine

## 2010-12-14 ENCOUNTER — Other Ambulatory Visit: Payer: Self-pay | Admitting: Internal Medicine

## 2010-12-14 ENCOUNTER — Encounter: Payer: Self-pay | Admitting: Internal Medicine

## 2010-12-14 ENCOUNTER — Encounter (INDEPENDENT_AMBULATORY_CARE_PROVIDER_SITE_OTHER): Payer: Medicare Other | Admitting: Internal Medicine

## 2010-12-14 DIAGNOSIS — J209 Acute bronchitis, unspecified: Secondary | ICD-10-CM

## 2010-12-14 DIAGNOSIS — R972 Elevated prostate specific antigen [PSA]: Secondary | ICD-10-CM

## 2010-12-14 DIAGNOSIS — N400 Enlarged prostate without lower urinary tract symptoms: Secondary | ICD-10-CM

## 2010-12-14 DIAGNOSIS — I10 Essential (primary) hypertension: Secondary | ICD-10-CM

## 2010-12-14 DIAGNOSIS — E119 Type 2 diabetes mellitus without complications: Secondary | ICD-10-CM

## 2010-12-14 DIAGNOSIS — R0609 Other forms of dyspnea: Secondary | ICD-10-CM

## 2010-12-14 LAB — CBC WITH DIFFERENTIAL/PLATELET
Basophils Absolute: 0 10*3/uL (ref 0.0–0.1)
Basophils Relative: 0.4 % (ref 0.0–3.0)
Eosinophils Absolute: 0.1 10*3/uL (ref 0.0–0.7)
Eosinophils Relative: 1.3 % (ref 0.0–5.0)
HCT: 48 % (ref 39.0–52.0)
Hemoglobin: 16.5 g/dL (ref 13.0–17.0)
Lymphocytes Relative: 14.8 % (ref 12.0–46.0)
Lymphs Abs: 1.3 10*3/uL (ref 0.7–4.0)
MCHC: 34.3 g/dL (ref 30.0–36.0)
MCV: 92.1 fl (ref 78.0–100.0)
Monocytes Absolute: 0.7 10*3/uL (ref 0.1–1.0)
Monocytes Relative: 8.2 % (ref 3.0–12.0)
Neutro Abs: 6.5 10*3/uL (ref 1.4–7.7)
Neutrophils Relative %: 75.3 % (ref 43.0–77.0)
Platelets: 259 10*3/uL (ref 150.0–400.0)
RBC: 5.21 Mil/uL (ref 4.22–5.81)
RDW: 14.4 % (ref 11.5–14.6)
WBC: 8.6 10*3/uL (ref 4.5–10.5)

## 2010-12-14 LAB — MICROALBUMIN / CREATININE URINE RATIO
Creatinine,U: 114.7 mg/dL
Microalb Creat Ratio: 1.4 mg/g (ref 0.0–30.0)
Microalb, Ur: 1.6 mg/dL (ref 0.0–1.9)

## 2010-12-14 LAB — PSA: PSA: 1.79 ng/mL (ref 0.10–4.00)

## 2010-12-14 LAB — HEMOGLOBIN A1C: Hgb A1c MFr Bld: 6.1 % (ref 4.6–6.5)

## 2010-12-17 NOTE — Medication Information (Signed)
Summary: rov/tm   Anticoagulant Therapy  Managed by: Bayard Hugger, PharmD Referring MD: Ladona Ridgel PCP: Nolon Rod. Paz MD Supervising MD: Clifton James MD, Cristal Deer Indication 1: Atrial Fibrillation (ICD-427.31) Lab Used: [facility]  Site: [facility] INR POC 2.4 INR RANGE 2 - 3  Dietary changes: no    Health status changes: no    Bleeding/hemorrhagic complications: no    Recent/future hospitalizations: no    Any changes in medication regimen? no    Recent/future dental: no  Any missed doses?: no       Is patient compliant with meds? yes       Allergies: 1)  ! * Cialis  Anticoagulation Management History:      Positive risk factors for bleeding include an age of 70 years or older and presence of serious comorbidities.  The bleeding index is 'intermediate risk'.  Positive CHADS2 values include History of CHF, History of HTN, and History of Diabetes.  Negative CHADS2 values include Age > 53 years old.  The start date was 12/03/2003.  His last INR was 2.4.  Anticoagulation responsible provider: Clifton James MD, Cristal Deer.  INR POC: 2.4.  Cuvette Lot#: [id number].  Exp: 10/2011.    Anticoagulation Management Assessment/Plan:      The patient's current anticoagulation dose is Coumadin 5 mg tabs: Take as directed by coumadin clinic..  The target INR is 2 - 3.  The next INR is due 01/08/2011.  Anticoagulation instructions were given to patient.  Results were reviewed/authorized by Bayard Hugger, PharmD.         Prior Anticoagulation Instructions: INR 2.1 Continue 5mg s everyday. Recheck in 4 weeks.   Current Anticoagulation Instructions: INR 2.4 Continue current dose: 1 tablet (5mg ) every day Recheck INR in 4 weeks.

## 2010-12-22 NOTE — Assessment & Plan Note (Signed)
Summary: yearly check/cbs   Vital Signs:  Patient profile:   70 year old male Height:      69 inches Weight:      223.13 pounds Pulse rate:   80 / minute Pulse rhythm:   regular BP sitting:   126 / 86  (left arm) Cuff size:   large  Vitals Entered By: [name] CMA (December 14, 2010 8:54 AM) CC: CPX, fasting  Comments no complaints [facility] scripts not had colonoscopy   History of Present Illness:   He was here for a complete physical exam however he other issues,  will do a  CPX at a later time.   his main concern today was dyspnea on exertion, this is going on for several months, worse lately? again, symptoms are not consistent, able to go 2 or 3 blocks w/o problems sometimes  Chart is reviewed ,  see assessment and plan   Developed a  URI 2 weeks ago , this seems to have  accentuated the  difficulty breathing.    as far as  diabetes,  CBGs are well-controlled,  ~  100, never less than 85   since the last office visit, he had an. extensive evaluation for a leg ulcer--->  ABI neg 10-2010, saw vascular (Dr Arbie Cookey 10-2010) -- venous insuff. , conservative treatment     ROS Denies fevers + cough for the last 2 weeks, clear sputum, no hemoptysis , occasional wheezing He has some runny nose but no sore throat  lower extremity edema decreased Denies chest pain or palpitations     Preventive Screening-Counseling & Management  Caffeine-Diet-Exercise     Does Patient Exercise: yes     Times/week: 2  Current Medications (verified): 1)  Glyburide 5 Mg Tabs (Glyburide) .... 4 By Mouth Once Daily 2)  Glucophage 850 Mg Tabs (Metformin Hcl) .... Two Times A Day 3)  Januvia 100 Mg Tabs (Sitagliptin Phosphate) .Marland Kitchen.. 1 By Mouth Qd 4)  Metoprolol Tartrate 50 Mg Tabs (Metoprolol Tartrate) .... One By Mouth Bid 5)  Cozaar 100 Mg Tabs (Losartan Potassium) .... Take 1 Tablet By Mouth Once A Day 6)  Lotrel 10-40 Mg Caps (Amlodipine Besy-Benazepril Hcl) .Marland Kitchen.. 1 By Mouth Qd 7)   Furosemide 20 Mg Tabs (Furosemide) .... Take 1-2 Tablet By Mouth Once A Day 8)  Digoxin 0.25 Mg Tabs (Digoxin) .... Take 1 Tablet By Mouth Once A Day 9)  Advicor 1000-20 Mg Tb24 (Niacin-Lovastatin) .... Take 1 Tablet By Mouth Once A Day 10)  Ambien 10 Mg  Tabs (Zolpidem Tartrate) .... One P.o. Nightly P.r.n. Insomnia 11)  Aspirin 325 Mg Tabs (Aspirin) .Marland Kitchen.. 1 Tab By Mouth Once Daily 12)  Multivitamins  Tabs (Multiple Vitamin) .Marland Kitchen.. 1 Tab By Mouth Once Daily 13)  Mucinex Dm 30-600 Mg Xr12h-Tab (Dextromethorphan-Guaifenesin) .... As Needed 14)  Ascensia Contour Test Strips .... Checks Blood Sugar Two Times A Day Dx 250.00 15)  Coumadin 5 Mg Tabs (Warfarin Sodium) .... Take As Directed By Coumadin Clinic. 16)  Mupirocin 2 % Oint (Mupirocin) .... Uad 17)  Clobetasol Propionate 0.05 % Crea (Clobetasol Propionate) .... Uad  Allergies (verified): 1)  ! * Cialis  Past History:  Past Medical History: HYPERTENSION (at some point hard to control, saw Dr Lowell Guitar) [name]  AODM dx 1999 ATRIAL FIBRILLATION, PAROXYSMAL   (Dr Ladona Ridgel) 03-2007.Marland KitchenMarland KitchenMarland KitchenStress test neg, ECHO 55% 04-2007: carciac cath w/ no high grade stenosis, +  P-HTN BENIGN PROSTATIC HYPERTROPHY--saw Dr [name] 2004 , normal renal u/s INSOMNIA, CHRONIC  2008: h/o  recurrent  loculated right plural effusion, s/p eval Dr [name] ,  Dr Laneta Simmers. W/U included thoracentesis   Leg ulcer, dx end of  2011, ABI neg 10-2010, saw vascular (Dr Arbie Cookey 10-2010) -- venous insuff. , conservative treatment   Past Surgical History: Reviewed history from 10/22/2010 and no changes required.  Status post large pneumothorax with fibrothorax.   Status post lung decortication -Dr. Laneta Simmers  Status post cardiac catheterization    Social History: Does Patient Exercise:  yes  Physical Exam  General:  alert, well-developed, and overweight-appearing.   Head:   face symmetric, nontender to palpation Ears:  R ear normal and L ear normal.   Mouth:   nor redness or  discharge Neck:   no JVD at 45 Lungs:  normal respiratory effort, no intercostal retractions, and no accessory muscle use.   rhonchi bilaterally, no wheezing or crackles. O2 sat 95% on room air Heart:  irregularly irregular Extremities:   no edema   Impression & Recommendations:  Problem # 1:  DYSPNEA ON EXERTION (ICD-786.09)   dyspnea for several months, gradually worse ? chart reviewed   he has a history of CHF, well controlled on clinical grounds, last echocardiogram to my knowledge was in 2008. 10-08-10 cardiology discontinue calcium channel blockers d/t  edema,  beta blockers dose increase 12-11 CT of the chest show chronic pleural thickening.  plan:  echocardiogram  PFTs  on return to the office, currently having bronchitis Reassess a few weeks, pulmonary re-eval?    His updated medication list for this problem includes:    Metoprolol Tartrate 50 Mg Tabs (Metoprolol tartrate) ..... One by mouth bid    Lotrel 10-40 Mg Caps (Amlodipine besy-benazepril hcl) .Marland Kitchen... 1 by mouth qd    Furosemide 20 Mg Tabs (Furosemide) .Marland Kitchen... Take 1-2 tablet by mouth once a day  Orders: Cardiology Referral (Cardiology)  Problem # 2:  BRONCHITIS- ACUTE (ICD-466.0)  see instructions  His updated medication list for this problem includes:    Mucinex Dm 30-600 Mg Xr12h-tab (Dextromethorphan-guaifenesin) .Marland Kitchen... As needed  Orders: T-2 View CXR (71020TC)  Problem # 3:  PSA, INCREASED (ICD-790.93) labs   Problem # 4:  HYPERTENSION (ICD-401.9) at goal  His updated medication list for this problem includes:    Metoprolol Tartrate 50 Mg Tabs (Metoprolol tartrate) ..... One by mouth bid    Cozaar 100 Mg Tabs (Losartan potassium) .Marland Kitchen... Take 1 tablet by mouth once a day    Lotrel 10-40 Mg Caps (Amlodipine besy-benazepril hcl) .Marland Kitchen... 1 by mouth qd    Furosemide 20 Mg Tabs (Furosemide) .Marland Kitchen... Take 1-2 tablet by mouth once a day  Orders: TLB-CBC Platelet - w/Differential (85025-CBCD) Specimen Handling  (99000)  BP today: 126/86 Prior BP: 144/70 (12/03/2010)  Labs Reviewed: K+: 4.1 (09/14/2010) Creat: : 0.7 (09/14/2010)   Chol: 152 (05/13/2010)   HDL: 37.60 (05/13/2010)   LDL: 82 (05/13/2010)   TG: 164.0 (05/13/2010)  Problem # 5:  AODM (ICD-250.00) labs  His updated medication list for this problem includes:    Glyburide 5 Mg Tabs (Glyburide) .Marland KitchenMarland KitchenMarland KitchenMarland Kitchen 4 by mouth once daily    Glucophage 850 Mg Tabs (Metformin hcl) .Marland Kitchen..Marland Kitchen Two times a day    Januvia 100 Mg Tabs (Sitagliptin phosphate) .Marland Kitchen... 1 by mouth qd    Cozaar 100 Mg Tabs (Losartan potassium) .Marland Kitchen... Take 1 tablet by mouth once a day    Lotrel 10-40 Mg Caps (Amlodipine besy-benazepril hcl) .Marland Kitchen... 1 by mouth qd    Aspirin 325 Mg Tabs (  Aspirin) .Marland Kitchen... 1 tab by mouth once daily  Orders: TLB-A1C / Hgb A1C (Glycohemoglobin) (83036-A1C) TLB-Microalbumin/Creat Ratio, Urine (82043-MALB) Specimen Handling (23557)  Labs Reviewed: Creat: 0.7 (09/14/2010)    Reviewed HgBA1c results: 6.3 (09/14/2010)  6.3 (05/13/2010)  Complete Medication List: 1)  Glyburide 5 Mg Tabs (Glyburide) .... 4 by mouth once daily 2)  Glucophage 850 Mg Tabs (Metformin hcl) .... Two times a day 3)  Januvia 100 Mg Tabs (Sitagliptin phosphate) .Marland Kitchen.. 1 by mouth qd 4)  Metoprolol Tartrate 50 Mg Tabs (Metoprolol tartrate) .... One by mouth bid 5)  Cozaar 100 Mg Tabs (Losartan potassium) .... Take 1 tablet by mouth once a day 6)  Lotrel 10-40 Mg Caps (Amlodipine besy-benazepril hcl) .Marland Kitchen.. 1 by mouth qd 7)  Furosemide 20 Mg Tabs (Furosemide) .... Take 1-2 tablet by mouth once a day 8)  Digoxin 0.25 Mg Tabs (Digoxin) .... Take 1 tablet by mouth once a day 9)  Advicor 1000-20 Mg Tb24 (Niacin-lovastatin) .... Take 1 tablet by mouth once a day 10)  Ambien 10 Mg Tabs (Zolpidem tartrate) .... One p.o. nightly p.r.n. insomnia 11)  Aspirin 325 Mg Tabs (Aspirin) .Marland Kitchen.. 1 tab by mouth once daily 12)  Multivitamins Tabs (Multiple vitamin) .Marland Kitchen.. 1 tab by mouth once daily 13)  Mucinex Dm  30-600 Mg Xr12h-tab (Dextromethorphan-guaifenesin) .... As needed 14)  Ascensia Contour Test Strips  .... Checks blood sugar two times a day dx 250.00 15)  Coumadin 5 Mg Tabs (Warfarin sodium) .... Take as directed by coumadin clinic. 16)  Mupirocin 2 % Oint (Mupirocin) .... Uad 17)  Clobetasol Propionate 0.05 % Crea (Clobetasol propionate) .... Uad 18)  Cefuroxime Axetil 500 Mg Tabs (Cefuroxime axetil) .Marland Kitchen.. 1 by mouth two times a day  Other Orders: Venipuncture (32202) TLB-PSA (Prostate Specific Antigen) (84153-PSA)  Patient Instructions: 1)   rest, fluids, Tylenol, and Mucinex DM as needed for cough 2)   take antibiotic as prescribed 3)  chest XR 4)  Call if you feel worse 5)  Please come back for your physical exam in 2 weeks Prescriptions: CEFUROXIME AXETIL 500 MG TABS (CEFUROXIME AXETIL) 1 by mouth two times a day  #14 x 0   Entered and Authorized by:   Nolon Rod. Paz MD   Signed by:   Nolon Rod. Paz MD on 12/14/2010   Method used:   Print then Give to Patient   RxID:   [id number]    Orders Added: 1)  Venipuncture [36415] 2)  TLB-A1C / Hgb A1C (Glycohemoglobin) [83036-A1C] 3)  TLB-PSA (Prostate Specific Antigen) [84153-PSA] 4)  TLB-Microalbumin/Creat Ratio, Urine [82043-MALB] 5)  TLB-CBC Platelet - w/Differential [85025-CBCD] 6)  T-2 View CXR [71020TC] 7)  Specimen Handling [99000] 8)  Cardiology Referral [Cardiology] 9)  Est. Patient Level IV [76160]     Risk Factors:  Exercise:  yes    Times per week:  2

## 2010-12-25 ENCOUNTER — Ambulatory Visit (HOSPITAL_COMMUNITY): Payer: Medicare Other | Attending: Internal Medicine

## 2010-12-25 ENCOUNTER — Other Ambulatory Visit: Payer: Self-pay | Admitting: Internal Medicine

## 2010-12-25 DIAGNOSIS — I517 Cardiomegaly: Secondary | ICD-10-CM | POA: Insufficient documentation

## 2010-12-25 DIAGNOSIS — R0989 Other specified symptoms and signs involving the circulatory and respiratory systems: Secondary | ICD-10-CM | POA: Insufficient documentation

## 2010-12-25 DIAGNOSIS — R0609 Other forms of dyspnea: Secondary | ICD-10-CM | POA: Insufficient documentation

## 2010-12-25 DIAGNOSIS — I10 Essential (primary) hypertension: Secondary | ICD-10-CM | POA: Insufficient documentation

## 2010-12-25 DIAGNOSIS — E785 Hyperlipidemia, unspecified: Secondary | ICD-10-CM | POA: Insufficient documentation

## 2010-12-25 DIAGNOSIS — I509 Heart failure, unspecified: Secondary | ICD-10-CM | POA: Insufficient documentation

## 2010-12-25 DIAGNOSIS — I4891 Unspecified atrial fibrillation: Secondary | ICD-10-CM | POA: Insufficient documentation

## 2010-12-28 ENCOUNTER — Encounter: Payer: Self-pay | Admitting: Internal Medicine

## 2010-12-28 ENCOUNTER — Encounter (INDEPENDENT_AMBULATORY_CARE_PROVIDER_SITE_OTHER): Payer: Medicare Other | Admitting: Internal Medicine

## 2010-12-28 DIAGNOSIS — Z0000 Encounter for general adult medical examination without abnormal findings: Secondary | ICD-10-CM

## 2010-12-28 DIAGNOSIS — R0609 Other forms of dyspnea: Secondary | ICD-10-CM

## 2010-12-29 ENCOUNTER — Encounter (INDEPENDENT_AMBULATORY_CARE_PROVIDER_SITE_OTHER): Payer: Self-pay | Admitting: *Deleted

## 2010-12-31 ENCOUNTER — Encounter: Payer: Self-pay | Admitting: *Deleted

## 2011-01-01 ENCOUNTER — Ambulatory Visit (INDEPENDENT_AMBULATORY_CARE_PROVIDER_SITE_OTHER): Payer: Medicare Other | Admitting: *Deleted

## 2011-01-01 DIAGNOSIS — Z7901 Long term (current) use of anticoagulants: Secondary | ICD-10-CM

## 2011-01-01 DIAGNOSIS — I4891 Unspecified atrial fibrillation: Secondary | ICD-10-CM

## 2011-01-01 LAB — POCT INR: INR: 2.3

## 2011-01-01 NOTE — Patient Instructions (Signed)
INR 2.3 Continue taking 1 tablet (5mg) daily.  Recheck in 4 weeks. 

## 2011-01-02 ENCOUNTER — Encounter: Payer: Self-pay | Admitting: Physician Assistant

## 2011-01-06 ENCOUNTER — Other Ambulatory Visit (INDEPENDENT_AMBULATORY_CARE_PROVIDER_SITE_OTHER): Payer: Medicare Other | Admitting: Internal Medicine

## 2011-01-06 ENCOUNTER — Other Ambulatory Visit: Payer: Medicare Other

## 2011-01-06 DIAGNOSIS — Z1211 Encounter for screening for malignant neoplasm of colon: Secondary | ICD-10-CM

## 2011-01-06 LAB — FECAL OCCULT BLOOD, IMMUNOCHEMICAL: Fecal Occult Bld: NEGATIVE

## 2011-01-07 NOTE — Letter (Signed)
Summary: New Patient letter  [facility]  [address] [place], [place] [address]   Phone: [phone]  Fax: [phone]       12/29/2010 MRN: [id number]  [facility] [address] [place], [place]  [address]  [place]  Dear Bradley [name],  Welcome to the Gastroenterology Division at [facility].    You are scheduled to see Dr.  Stan Head on February 08, 2011 at 3:00pm on the 3rd floor at [facility], [address]  We ask that you try to arrive at our office 15 minutes prior to your appointment time to allow for check-in.  We would like you to complete the enclosed self-administered evaluation form prior to your visit and bring it with you on the day of your appointment.  We will review it with you.  Also, please bring a complete list of all your medications or, if you prefer, bring the medication bottles and we will list them.  Please bring your insurance card so that we may make a copy of it.  If your insurance requires a referral to see a specialist, please bring your referral form from your primary care physician.  Co-payments are due at the time of your visit and may be paid by cash, check or credit card.     Your office visit will consist of a consult with your physician (includes a physical exam), any laboratory testing he/she may order, scheduling of any necessary diagnostic testing (e.g. x-ray, ultrasound, CT-scan), and scheduling of a procedure (e.g. Endoscopy, Colonoscopy) if required.  Please allow enough time on your schedule to allow for any/all of these possibilities.    If you cannot keep your appointment, please call [phone] to cancel or reschedule prior to your appointment date.  This allows [place] the opportunity to schedule an appointment for another patient in need of care.  If you do not cancel or reschedule by 5 p.m. the business day prior to your appointment date, you will be charged a $50.00 late cancellation/no-show fee.    Thank  you for choosing [facility] for your medical needs.  We appreciate the opportunity to care for you.  Please visit [place] at our website  to learn more about our practice.                     Sincerely,                                                             The Gastroenterology Division

## 2011-01-07 NOTE — Letter (Signed)
Summary: [facility]: Immunoassay Fecal Occult Blood (iFOB) Order Form  [facility] [place]  [address] [place], [place] [address]   Phone: [phone]  Fax: [phone]      [facility]: Immunoassay Fecal Occult Blood (iFOB) Order Form   December 31, 2010 MRN: [id number]   Bradley [name] 1941/06/24   Physicican Name:____jose paz,md_____________________  Diagnosis Code:____v76.51______________________      [name] CMA

## 2011-01-07 NOTE — Assessment & Plan Note (Signed)
Summary: cpx/cbs   Vital Signs:  Patient profile:   70 year old male Height:      69 inches Weight:      223.50 pounds Pulse rate:   72 / minute Pulse rhythm:   regular BP sitting:   132 / 82  (left arm) Cuff size:   large  Vitals Entered By: [name] CMA (December 28, 2010 9:03 AM) CC: CPX, fasting  Comments no concerns [facility] scripts  not had colonoscopy   History of Present Illness: Here for Medicare AWV:  1.   Risk factors based on Past M, S, F history: reviewed  2.   Physical Activities: active, yard work, goes to the [employer]", not in the past 3 weeks, ready to go                          back  3.   Depression/mood:  no problems reported or noted  4.   Hearing: in the past was Rx hearing aids , decided not use them; no problems w/ regular                               conversation, offered a referal to an audiologist 5.   ADL's: independent  6.   Fall Risk: low, no recent falls or balance issues  7.   Home Safety: does feel safe at home  8.   Height, weight, &visual acuity: see VS, uses glasses no problems  9.   Counseling: yes  10.   Labs ordered based on risk factors: yes 11.           Referral Coordination, if needed  12.           Care Plan, see a/p  13.            Cognitive Assessment: motor, cognition, memory wnl    in addition, we discussed the following see last OV, DOE better  still cough, still w/ some sputum, clear mostly and occasionally white       Current Medications (verified): 1)  Glyburide 5 Mg Tabs (Glyburide) .... 4 By Mouth Once Daily 2)  Glucophage 850 Mg Tabs (Metformin Hcl) .... Two Times A Day 3)  Januvia 100 Mg Tabs (Sitagliptin Phosphate) .Marland Kitchen.. 1 By Mouth Qd 4)  Metoprolol Tartrate 50 Mg Tabs (Metoprolol Tartrate) .... One By Mouth Bid 5)  Cozaar 100 Mg Tabs (Losartan Potassium) .... Take 1 Tablet By Mouth Once A Day 6)  Lotrel 10-40 Mg Caps (Amlodipine Besy-Benazepril Hcl) .Marland Kitchen.. 1 By Mouth Qd 7)  Digoxin 0.25 Mg Tabs (Digoxin) ....  Take 1 Tablet By Mouth Once A Day 8)  Advicor 1000-20 Mg Tb24 (Niacin-Lovastatin) .... Take 1 Tablet By Mouth Once A Day 9)  Ambien 10 Mg  Tabs (Zolpidem Tartrate) .... One P.o. Nightly P.r.n. Insomnia 10)  Aspirin 325 Mg Tabs (Aspirin) .Marland Kitchen.. 1 Tab By Mouth Once Daily 11)  Multivitamins  Tabs (Multiple Vitamin) .Marland Kitchen.. 1 Tab By Mouth Once Daily 12)  Mucinex Dm 30-600 Mg Xr12h-Tab (Dextromethorphan-Guaifenesin) .... As Needed 13)  Ascensia Contour Test Strips .... Checks Blood Sugar Two Times A Day Dx 250.00 14)  Coumadin 5 Mg Tabs (Warfarin Sodium) .... Take As Directed By Coumadin Clinic. 15)  Mupirocin 2 % Oint (Mupirocin) .... Uad 16)  Clobetasol Propionate 0.05 % Crea (Clobetasol Propionate) .... Uad 17)  Cefuroxime Axetil 500 Mg Tabs (Cefuroxime Axetil) .Marland KitchenMarland KitchenMarland Kitchen  1 By Mouth Two Times A Day  Allergies (verified): 1)  ! * Cialis  Past History:  Past Medical History: Reviewed history from 12/14/2010 and no changes required. HYPERTENSION (at some point hard to control, saw Dr Lowell Guitar) [name]  AODM dx 1999 ATRIAL FIBRILLATION, PAROXYSMAL   (Dr Ladona Ridgel) 03-2007.Marland KitchenMarland KitchenMarland KitchenStress test neg, ECHO 55% 04-2007: carciac cath w/ no high grade stenosis, +  P-HTN BENIGN PROSTATIC HYPERTROPHY--saw Dr [name] 2004 , normal renal u/s INSOMNIA, CHRONIC  2008: h/o  recurrent  loculated right plural effusion, s/p eval Dr [name] ,  Dr Laneta Simmers. W/U included thoracentesis   Leg ulcer, dx end of  2011, ABI neg 10-2010, saw vascular (Dr Arbie Cookey 10-2010) -- venous insuff. , conservative treatment   Past Surgical History: Reviewed history from 10/22/2010 and no changes required.  Status post large pneumothorax with fibrothorax.   Status post lung decortication -Dr. Laneta Simmers  Status post cardiac catheterization    Family History: Reviewed history from 10/22/2010 and no changes required. MI-- no DM-- F Colon ca-- no prostate ca--no  Mother-deceased, rare blood disease Father-deceased, DM 1 brother-healthy 1  sister-healthy  Social History: Reviewed history from 10/22/2010 and no changes required. Married 1 son moved from [place] He lives with his wife.   No smoking for last 30 years. (used to smoke 2.5 ppd for 25 years) Last alcohol was 1 beer last week at his son's wedding.   No drugs.   Review of Systems CV:  Denies palpitations and swelling of feet. Resp:  Denies coughing up blood and wheezing. GU:  Denies hematuria, urinary frequency, and urinary hesitancy.  Physical Exam  General:  alert and well-developed.   Lungs:  normal respiratory effort, no intercostal retractions, no accessory muscle use, and normal breath sounds.   Heart:  irregularly irregular Abdomen:  soft, non-tender, no distention, no masses, no guarding, and no rigidity.  no bruit Rectal:  No external abnormalities noted. Normal sphincter tone. No rectal masses or tenderness. Hemoccult negative Prostate:  Prostate gland firm and smooth, no enlargement, nodularity, tenderness, mass, asymmetry or induration. Extremities:   no edema   Impression & Recommendations:  Problem # 1:  HEALTH SCREENING (ICD-V70.0)  Td 2004 pneumonia shot 2005 and reportedly had another dose in the hospital  shingle immunization ----> Rx provided, benefits discussed    Cscope: never had one  iFOB neg 2011, provided one today he has a # of comorbidities , on coumadin; I'm hesitant to refer him to direct Cscope plan: refer to GI with the question of what is the best strategy for CCS: yearly iFOB? had a Cscope?  normal  PSA refer to audiologist  printed material provided regards diet provided   Orders: Gastroenterology Referral (GI) Misc. Referral (Misc. Ref) Medicare -1st Annual Wellness Visit [phone])  Problem # 2:  DYSPNEA ON EXERTION (ICD-786.09) better compared to last OV ECHO showed a slightly  decreased EF DOE may have been die to bronchitis which is now better will ask cards to see nevertheless as EF is decreased    patient reports he is not on lasix   The following medications were removed from the medication list:    Furosemide 20 Mg Tabs (Furosemide) .Marland Kitchen... Take 1-2 tablet by mouth once a day His updated medication list for this problem includes:    Metoprolol Tartrate 50 Mg Tabs (Metoprolol tartrate) ..... One by mouth bid    Lotrel 10-40 Mg Caps (Amlodipine besy-benazepril hcl) .Marland Kitchen... 1 by mouth qd  Complete Medication List: 1)  Glyburide 5 Mg Tabs (Glyburide) .... 4 by mouth once daily 2)  Glucophage 850 Mg Tabs (Metformin hcl) .... Two times a day 3)  Januvia 100 Mg Tabs (Sitagliptin phosphate) .Marland Kitchen.. 1 by mouth qd 4)  Metoprolol Tartrate 50 Mg Tabs (Metoprolol tartrate) .... One by mouth bid 5)  Cozaar 100 Mg Tabs (Losartan potassium) .... Take 1 tablet by mouth once a day 6)  Lotrel 10-40 Mg Caps (Amlodipine besy-benazepril hcl) .Marland Kitchen.. 1 by mouth qd 7)  Digoxin 0.25 Mg Tabs (Digoxin) .... Take 1 tablet by mouth once a day 8)  Advicor 1000-20 Mg Tb24 (Niacin-lovastatin) .... Take 1 tablet by mouth once a day 9)  Ambien 10 Mg Tabs (Zolpidem tartrate) .... One p.o. nightly p.r.n. insomnia 10)  Aspirin 325 Mg Tabs (Aspirin) .Marland Kitchen.. 1 tab by mouth once daily 11)  Multivitamins Tabs (Multiple vitamin) .Marland Kitchen.. 1 tab by mouth once daily 12)  Mucinex Dm 30-600 Mg Xr12h-tab (Dextromethorphan-guaifenesin) .... As needed 13)  Ascensia Contour Test Strips  .... Checks blood sugar two times a day dx 250.00 14)  Coumadin 5 Mg Tabs (Warfarin sodium) .... Take as directed by coumadin clinic. 15)  Mupirocin 2 % Oint (Mupirocin) .... Uad 16)  Clobetasol Propionate 0.05 % Crea (Clobetasol propionate) .... Uad 17)  Zostavax 57846 Unt/0.30ml Solr (Zoster vaccine live) .Marland Kitchen.. 1 s.q.  Patient Instructions: 1)  Please schedule a follow-up appointment in 4 months .  Prescriptions: ZOSTAVAX 96295 UNT/0.65ML SOLR (ZOSTER VACCINE LIVE) 1 s.q.  #1 x 0   Entered and Authorized by:   Nolon Rod. Zai Chmiel MD   Signed by:   Nolon Rod. Skarlet Lyons MD on  12/28/2010   Method used:   Print then Give to Patient   RxID:   [id number]    Orders Added: 1)  Gastroenterology Referral [GI] 2)  Misc. Referral [Misc. Ref] 3)  Medicare -1st Annual Wellness Visit [phone]

## 2011-01-11 ENCOUNTER — Encounter: Payer: Self-pay | Admitting: Physician Assistant

## 2011-01-11 ENCOUNTER — Ambulatory Visit (INDEPENDENT_AMBULATORY_CARE_PROVIDER_SITE_OTHER): Payer: Medicare Other | Admitting: Physician Assistant

## 2011-01-11 ENCOUNTER — Ambulatory Visit (INDEPENDENT_AMBULATORY_CARE_PROVIDER_SITE_OTHER): Payer: Medicare Other | Admitting: Cardiovascular Disease

## 2011-01-11 DIAGNOSIS — I509 Heart failure, unspecified: Secondary | ICD-10-CM

## 2011-01-11 DIAGNOSIS — R0989 Other specified symptoms and signs involving the circulatory and respiratory systems: Secondary | ICD-10-CM

## 2011-01-11 DIAGNOSIS — R0602 Shortness of breath: Secondary | ICD-10-CM

## 2011-01-11 DIAGNOSIS — I5033 Acute on chronic diastolic (congestive) heart failure: Secondary | ICD-10-CM | POA: Insufficient documentation

## 2011-01-11 DIAGNOSIS — R0609 Other forms of dyspnea: Secondary | ICD-10-CM

## 2011-01-11 DIAGNOSIS — Z7901 Long term (current) use of anticoagulants: Secondary | ICD-10-CM

## 2011-01-11 DIAGNOSIS — R079 Chest pain, unspecified: Secondary | ICD-10-CM | POA: Insufficient documentation

## 2011-01-11 DIAGNOSIS — I4891 Unspecified atrial fibrillation: Secondary | ICD-10-CM

## 2011-01-11 DIAGNOSIS — I5032 Chronic diastolic (congestive) heart failure: Secondary | ICD-10-CM | POA: Insufficient documentation

## 2011-01-11 NOTE — Progress Notes (Signed)
History of Present Illness: Primary Electrophysiologist:  Dr. Lewayne Bunting  Bradley [name] is a 70 y.o. male with a h/o Permanent atrial fibrillation on chronic Coumadin therapy, diastolic heart failure, chronic venous insufficiency, diabetes, hypertension, hyperlipidemia.  Cardiac catheterization July 2008 demonstrated no CAD, moderate pulmonary hypertension that was probably secondary to diastolic dysfunction.  Echocardiogram in 2008 demonstrated an EF of 55%, mild LVH, mild MR, mild to moderate pulmonary hypertension and right atrial enlargement.  He saw his PCP recently and noted increased dyspnea with exertion.  He traveled to [facility] in November and had significant dyspnea on exertion.  He really describes NYHA class III symptoms.  His breathing improved over time.  He's had times where it got worse.  He also notes a left chest prickling sensation.  This comes and goes.  It is not really exertional.  He denies any radiating symptoms.  He denies associated nausea or diaphoresis.  He does feel somewhat short of breath with this.  He denies syncope.  He denies orthopnea or PND.  His LE edema is fairly stable.  His weight is stable and he has actually been trying to lose.  His PCP arranged a CXR.  This demonstrated chronic right scarring and a small right effusion.  Echo done 3/16 demonstrates EF 45-50%, mod LVH, global HK, mod BAE, PASP 36.  He is referred back due to low EF.  Past Medical History  Diagnosis Date  . HTN (hypertension)   . HLD (hyperlipidemia)   . DM (diabetes mellitus) 1999    adult onset  . Permanent atrial fibrillation   . BPH (benign prostatic hyperplasia)   . Leg ulcer 2011  . Chronic diastolic heart failure     Echocardiogram 12/25/10: EF 45-50%; moderate LVH; global hypokinesis; moderate biatrial enlargement; PASP 36;    Cardiac catheterization July 2008: No CAD; moderate pulmonary hypertension  . Pleural effusion 2008    Status post decortication    Current  Outpatient Prescriptions  Medication Sig Dispense Refill  . ADVICOR 1000-20 MG 24 hr tablet Take 1 tablet by mouth daily.      Marland Kitchen aspirin 325 MG tablet Take 325 mg by mouth daily.        Marland Kitchen BAYER CONTOUR TEST test strip as directed.      . clobetasol (TEMOVATE) 0.05 % cream as directed.      Marland Kitchen dextromethorphan-guaiFENesin (MUCINEX DM) 30-600 MG per 12 hr tablet Take 1 tablet by mouth as needed.        . digoxin (LANOXIN) 0.25 MG tablet Take 1 tablet by mouth daily.      Marland Kitchen glyBURIDE (DIABETA) 5 MG tablet Take 4 tablets by mouth daily.      Marland Kitchen JANUVIA 100 MG tablet Take 1 tablet by mouth daily.      Marland Kitchen losartan (COZAAR) 100 MG tablet Take 1 tablet by mouth daily.      Marland Kitchen LOTREL 10-40 MG per capsule Take 1 tablet by mouth daily.      . metFORMIN (GLUCOPHAGE) 850 MG tablet Take 1 tablet by mouth Twice daily.      . metoprolol (LOPRESSOR) 50 MG tablet Take 1 tablet by mouth Twice daily.      . Multiple Vitamin (MULTIVITAMIN) tablet Take 1 tablet by mouth daily.        . mupirocin (BACTROBAN) 2 % ointment as directed.      . warfarin (COUMADIN) 5 MG tablet Take by mouth as directed.        Marland Kitchen  zolpidem (AMBIEN) 10 MG tablet Take 10 mg by mouth at bedtime as needed.          Allergies  Allergen Reactions  . Tadalafil     REACTION: HA, backache   History  Substance Use Topics  . Smoking status: Former Smoker -- 2.5 packs/day for 25 years    Types: Cigarettes    Quit date: 10/11/1980  . Smokeless tobacco: Not on file  . Alcohol Use: Yes   ROS:  See history of present illness.  He denies fevers, chills, cough.  He had a recent bronchitis.  His symptoms worsened with this and then improved with treatment.  He denies melena, hematochezia, hematuria.  He does have some pleuritic symptoms.  He had a recent trip to [place].  We checked his INR today and it is therapeutic.  All other systems reviewed and negative.  Vital Signs: BP 136/78  Pulse 69  Ht 5\' 8"  (1.727 m)  Wt 225 lb (102.059 kg)  BMI  34.21 kg/m2  PHYSICAL EXAM: Well nourished, well developed, in no acute distress HEENT: normal Neck: no JVD Cardiac:  normal S1, S2; Irregularly irregular rhythm; 2/6 systolic ejection murmur heard best along the left lower sternal border Lungs:  clear to auscultation bilaterally, no wheezing, rhonchi or rales Abd: soft, nontender, no hepatomegaly Ext: Trace to 1+ bilateral edema; Chronic stasis changes Skin: warm and dry Neuro:  CNs 2-12 intact, no focal abnormalities noted  EKG:  Atrial fibrillation, heart rate 69, normal axis, T wave inversions in leads one, 2, V5-V6.  No significant change when compared to prior tracings.  ASSESSMENT AND PLAN:

## 2011-01-11 NOTE — Assessment & Plan Note (Addendum)
Very atypical.  He had a normal heart catheterization in 2008.  The likelihood  that he has developed significant obstructive coronary disease is quite low.  However he does have a risk equivalent of diabetes.  Given his reduction in ejection fraction, I will set him up for a Myoview study.  Followup with Dr. Ladona Ridgel in 4 weeks.

## 2011-01-11 NOTE — Assessment & Plan Note (Signed)
Rate control.  INR is therapeutic.

## 2011-01-11 NOTE — Assessment & Plan Note (Signed)
His volume appears stable.  His EF is slightly reduced from his previous.  I will check a basic metabolic panel and a BNP today.  He was previously on furosemide.  He is off of this for unclear reasons.  If his BNP is abnormal, I will place him back on furosemide.

## 2011-01-11 NOTE — Patient Instructions (Signed)
Your physician recommends that you schedule a follow-up appointment in: 4 weeks with Dr. Ladona Ridgel as per Tereso Newcomer, PA-C.  Your physician recommends that you return for lab work in: bmet, bnp 427.31, 428.32  Your physician has requested that you have a lexiscan myoview for 786.05, 786.50. For further information please visit [email or web address] Please follow instruction sheet, as given.

## 2011-01-11 NOTE — Assessment & Plan Note (Signed)
As noted, I will check a basic metabolic panel and BNP.  Given the slight reduction in his ejection fraction and cardiac risk factors which include diabetes I will set him up for a Lexiscan Myoview.

## 2011-01-12 ENCOUNTER — Other Ambulatory Visit (INDEPENDENT_AMBULATORY_CARE_PROVIDER_SITE_OTHER): Payer: Medicare Other | Admitting: *Deleted

## 2011-01-12 DIAGNOSIS — R0602 Shortness of breath: Secondary | ICD-10-CM

## 2011-01-12 DIAGNOSIS — I4891 Unspecified atrial fibrillation: Secondary | ICD-10-CM

## 2011-01-12 DIAGNOSIS — I5032 Chronic diastolic (congestive) heart failure: Secondary | ICD-10-CM

## 2011-01-12 LAB — BRAIN NATRIURETIC PEPTIDE: Pro B Natriuretic peptide (BNP): 159.5 pg/mL — ABNORMAL HIGH (ref 0.0–100.0)

## 2011-01-12 LAB — POCT INR: INR: 2.3

## 2011-01-13 LAB — BASIC METABOLIC PANEL
BUN: 13 mg/dL (ref 6–23)
CO2: 27 mEq/L (ref 19–32)
Calcium: 8.6 mg/dL (ref 8.4–10.5)
Chloride: 105 mEq/L (ref 96–112)
Creatinine, Ser: 0.8 mg/dL (ref 0.4–1.5)
GFR: 109.45 mL/min (ref >60.00)
Glucose, Bld: 167 mg/dL — ABNORMAL HIGH (ref 70–99)
Potassium: 4.4 mEq/L (ref 3.5–5.1)
Sodium: 142 mEq/L (ref 135–145)

## 2011-01-18 LAB — DIFFERENTIAL
Basophils Absolute: 0 10*3/uL (ref 0.0–0.1)
Basophils Absolute: 0 10*3/uL (ref 0.0–0.1)
Basophils Relative: 0 % (ref 0–1)
Basophils Relative: 0 % (ref 0–1)
Eosinophils Absolute: 0 10*3/uL (ref 0.0–0.7)
Eosinophils Absolute: 0.1 10*3/uL (ref 0.0–0.7)
Eosinophils Relative: 0 % (ref 0–5)
Eosinophils Relative: 1 % (ref 0–5)
Lymphocytes Relative: 17 % (ref 12–46)
Lymphocytes Relative: 5 % — ABNORMAL LOW (ref 12–46)
Lymphs Abs: 0.8 10*3/uL (ref 0.7–4.0)
Lymphs Abs: 1.4 10*3/uL (ref 0.7–4.0)
Monocytes Absolute: 0.6 10*3/uL (ref 0.1–1.0)
Monocytes Absolute: 0.7 10*3/uL (ref 0.1–1.0)
Monocytes Relative: 4 % (ref 3–12)
Monocytes Relative: 8 % (ref 3–12)
Neutro Abs: 15.6 10*3/uL — ABNORMAL HIGH (ref 1.7–7.7)
Neutro Abs: 5.8 10*3/uL (ref 1.7–7.7)
Neutrophils Relative %: 74 % (ref 43–77)
Neutrophils Relative %: 91 % — ABNORMAL HIGH (ref 43–77)

## 2011-01-18 LAB — POCT CARDIAC MARKERS
CKMB, poc: <1 ng/mL — ABNORMAL LOW (ref 1.0–8.0)
Myoglobin, poc: 127 ng/mL (ref 12–200)
Troponin i, poc: <0.05 ng/mL (ref 0.00–0.09)

## 2011-01-18 LAB — CBC
HCT: 37.9 % — ABNORMAL LOW (ref 39.0–52.0)
HCT: 40 % (ref 39.0–52.0)
HCT: 42.4 % (ref 39.0–52.0)
Hemoglobin: 13.1 g/dL (ref 13.0–17.0)
Hemoglobin: 13.9 g/dL (ref 13.0–17.0)
Hemoglobin: 14.6 g/dL (ref 13.0–17.0)
MCHC: 34.5 g/dL (ref 30.0–36.0)
MCHC: 34.7 g/dL (ref 30.0–36.0)
MCHC: 34.8 g/dL (ref 30.0–36.0)
MCV: 90.2 fL (ref 78.0–100.0)
MCV: 90.7 fL (ref 78.0–100.0)
MCV: 90.7 fL (ref 78.0–100.0)
Platelets: 131 10*3/uL — ABNORMAL LOW (ref 150–400)
Platelets: 140 10*3/uL — ABNORMAL LOW (ref 150–400)
Platelets: 153 10*3/uL (ref 150–400)
RBC: 4.18 MIL/uL — ABNORMAL LOW (ref 4.22–5.81)
RBC: 4.41 MIL/uL (ref 4.22–5.81)
RBC: 4.7 MIL/uL (ref 4.22–5.81)
RDW: 13.9 % (ref 11.5–15.5)
RDW: 14.2 % (ref 11.5–15.5)
RDW: 14.2 % (ref 11.5–15.5)
WBC: 13.1 10*3/uL — ABNORMAL HIGH (ref 4.0–10.5)
WBC: 17.1 10*3/uL — ABNORMAL HIGH (ref 4.0–10.5)
WBC: 7.9 10*3/uL (ref 4.0–10.5)

## 2011-01-18 LAB — URINALYSIS, ROUTINE W REFLEX MICROSCOPIC
Bilirubin Urine: NEGATIVE
Glucose, UA: NEGATIVE mg/dL
Hgb urine dipstick: NEGATIVE
Ketones, ur: NEGATIVE mg/dL
Leukocytes, UA: NEGATIVE
Nitrite: NEGATIVE
Protein, ur: 30 mg/dL — AB
Specific Gravity, Urine: 1.022 (ref 1.005–1.030)
Urobilinogen, UA: 1 mg/dL (ref 0.0–1.0)
pH: 5.5 (ref 5.0–8.0)

## 2011-01-18 LAB — CARDIAC PANEL(CRET KIN+CKTOT+MB+TROPI)
CK, MB: 1.3 ng/mL (ref 0.3–4.0)
CK, MB: 1.4 ng/mL (ref 0.3–4.0)
CK, MB: 1.6 ng/mL (ref 0.3–4.0)
Relative Index: 1.3 (ref 0.0–2.5)
Relative Index: 1.4 (ref 0.0–2.5)
Relative Index: INVALID (ref 0.0–2.5)
Total CK: 100 U/L (ref 7–232)
Total CK: 116 U/L (ref 7–232)
Total CK: 95 U/L (ref 7–232)
Troponin I: 0.02 ng/mL (ref 0.00–0.06)
Troponin I: 0.04 ng/mL (ref 0.00–0.06)
Troponin I: 0.04 ng/mL (ref 0.00–0.06)

## 2011-01-18 LAB — APTT
aPTT: 40 seconds — ABNORMAL HIGH (ref 24–37)
aPTT: 56 seconds — ABNORMAL HIGH (ref 24–37)

## 2011-01-18 LAB — BASIC METABOLIC PANEL
BUN: 11 mg/dL (ref 6–23)
BUN: 14 mg/dL (ref 6–23)
CO2: 24 mEq/L (ref 19–32)
CO2: 24 mEq/L (ref 19–32)
Calcium: 8.2 mg/dL — ABNORMAL LOW (ref 8.4–10.5)
Calcium: 8.3 mg/dL — ABNORMAL LOW (ref 8.4–10.5)
Chloride: 104 mEq/L (ref 96–112)
Chloride: 104 mEq/L (ref 96–112)
Creatinine, Ser: 0.82 mg/dL (ref 0.4–1.5)
Creatinine, Ser: 0.85 mg/dL (ref 0.4–1.5)
GFR calc Af Amer: >60 mL/min (ref >60)
GFR calc Af Amer: >60 mL/min (ref >60)
GFR calc non Af Amer: >60 mL/min (ref >60)
GFR calc non Af Amer: >60 mL/min (ref >60)
Glucose, Bld: 107 mg/dL — ABNORMAL HIGH (ref 70–99)
Glucose, Bld: 134 mg/dL — ABNORMAL HIGH (ref 70–99)
Potassium: 3.5 mEq/L (ref 3.5–5.1)
Potassium: 3.7 mEq/L (ref 3.5–5.1)
Sodium: 134 mEq/L — ABNORMAL LOW (ref 135–145)
Sodium: 138 mEq/L (ref 135–145)

## 2011-01-18 LAB — COMPREHENSIVE METABOLIC PANEL
ALT: 12 U/L (ref 0–53)
AST: 31 U/L (ref 0–37)
Albumin: 3.7 g/dL (ref 3.5–5.2)
Alkaline Phosphatase: 71 U/L (ref 39–117)
BUN: 12 mg/dL (ref 6–23)
CO2: 23 mEq/L (ref 19–32)
Calcium: 8.8 mg/dL (ref 8.4–10.5)
Chloride: 99 mEq/L (ref 96–112)
Creatinine, Ser: 1.05 mg/dL (ref 0.4–1.5)
GFR calc Af Amer: >60 mL/min (ref >60)
GFR calc non Af Amer: >60 mL/min (ref >60)
Glucose, Bld: 201 mg/dL — ABNORMAL HIGH (ref 70–99)
Potassium: 4.2 mEq/L (ref 3.5–5.1)
Sodium: 130 mEq/L — ABNORMAL LOW (ref 135–145)
Total Bilirubin: 0.9 mg/dL (ref 0.3–1.2)
Total Protein: 7.4 g/dL (ref 6.0–8.3)

## 2011-01-18 LAB — OSMOLALITY: Osmolality: 273 mOsm/kg — ABNORMAL LOW (ref 275–300)

## 2011-01-18 LAB — GLUCOSE, CAPILLARY
Glucose-Capillary: 113 mg/dL — ABNORMAL HIGH (ref 70–99)
Glucose-Capillary: 114 mg/dL — ABNORMAL HIGH (ref 70–99)
Glucose-Capillary: 120 mg/dL — ABNORMAL HIGH (ref 70–99)
Glucose-Capillary: 136 mg/dL — ABNORMAL HIGH (ref 70–99)
Glucose-Capillary: 138 mg/dL — ABNORMAL HIGH (ref 70–99)
Glucose-Capillary: 139 mg/dL — ABNORMAL HIGH (ref 70–99)
Glucose-Capillary: 145 mg/dL — ABNORMAL HIGH (ref 70–99)
Glucose-Capillary: 148 mg/dL — ABNORMAL HIGH (ref 70–99)
Glucose-Capillary: 148 mg/dL — ABNORMAL HIGH (ref 70–99)
Glucose-Capillary: 150 mg/dL — ABNORMAL HIGH (ref 70–99)
Glucose-Capillary: 150 mg/dL — ABNORMAL HIGH (ref 70–99)
Glucose-Capillary: 164 mg/dL — ABNORMAL HIGH (ref 70–99)
Glucose-Capillary: 165 mg/dL — ABNORMAL HIGH (ref 70–99)
Glucose-Capillary: 172 mg/dL — ABNORMAL HIGH (ref 70–99)
Glucose-Capillary: 192 mg/dL — ABNORMAL HIGH (ref 70–99)
Glucose-Capillary: 221 mg/dL — ABNORMAL HIGH (ref 70–99)

## 2011-01-18 LAB — CULTURE, BLOOD (ROUTINE X 2)
Culture: NO GROWTH
Culture: NO GROWTH

## 2011-01-18 LAB — CALCIUM, URINE, RANDOM: Calcium, Ur: 2 mg/dL

## 2011-01-18 LAB — TROPONIN I: Troponin I: 0.04 ng/mL (ref 0.00–0.06)

## 2011-01-18 LAB — URINE CULTURE
Colony Count: NO GROWTH
Culture: NO GROWTH

## 2011-01-18 LAB — HEMOGLOBIN A1C
Hgb A1c MFr Bld: 6.8 % — ABNORMAL HIGH (ref 4.6–6.1)
Mean Plasma Glucose: 148 mg/dL

## 2011-01-18 LAB — URINE MICROSCOPIC-ADD ON

## 2011-01-18 LAB — PROTIME-INR
INR: 1.5 (ref 0.00–1.49)
INR: 1.8 — ABNORMAL HIGH (ref 0.00–1.49)
INR: 2.3 — ABNORMAL HIGH (ref 0.00–1.49)
INR: 2.7 — ABNORMAL HIGH (ref 0.00–1.49)
INR: 2.8 — ABNORMAL HIGH (ref 0.00–1.49)
Prothrombin Time: 18.9 seconds — ABNORMAL HIGH (ref 11.6–15.2)
Prothrombin Time: 21.6 seconds — ABNORMAL HIGH (ref 11.6–15.2)
Prothrombin Time: 27.1 seconds — ABNORMAL HIGH (ref 11.6–15.2)
Prothrombin Time: 30.6 seconds — ABNORMAL HIGH (ref 11.6–15.2)
Prothrombin Time: 31.4 seconds — ABNORMAL HIGH (ref 11.6–15.2)

## 2011-01-18 LAB — CK TOTAL AND CKMB (NOT AT ARMC)
CK, MB: 0.8 ng/mL (ref 0.3–4.0)
Relative Index: INVALID (ref 0.0–2.5)
Total CK: 64 U/L (ref 7–232)

## 2011-01-18 LAB — SODIUM, URINE, RANDOM: Sodium, Ur: 31 mEq/L

## 2011-01-18 LAB — TSH: TSH: 0.67 u[IU]/mL (ref 0.350–4.500)

## 2011-01-18 LAB — BRAIN NATRIURETIC PEPTIDE: Pro B Natriuretic peptide (BNP): 101 pg/mL — ABNORMAL HIGH (ref 0.0–100.0)

## 2011-01-18 LAB — MAGNESIUM: Magnesium: 2.1 mg/dL (ref 1.5–2.5)

## 2011-01-18 LAB — D-DIMER, QUANTITATIVE: D-Dimer, Quant: 0.23 ug/mL-FEU (ref 0.00–0.48)

## 2011-01-25 ENCOUNTER — Ambulatory Visit (HOSPITAL_COMMUNITY): Payer: Medicare Other | Attending: Internal Medicine | Admitting: Radiology

## 2011-01-25 DIAGNOSIS — R079 Chest pain, unspecified: Secondary | ICD-10-CM | POA: Insufficient documentation

## 2011-01-25 DIAGNOSIS — R0989 Other specified symptoms and signs involving the circulatory and respiratory systems: Secondary | ICD-10-CM | POA: Insufficient documentation

## 2011-01-25 DIAGNOSIS — R0609 Other forms of dyspnea: Secondary | ICD-10-CM | POA: Insufficient documentation

## 2011-01-25 DIAGNOSIS — R0602 Shortness of breath: Secondary | ICD-10-CM

## 2011-01-25 MED ORDER — TECHNETIUM TC 99M TETROFOSMIN IV KIT
11.0000 | PACK | Freq: Once | INTRAVENOUS | Status: AC | PRN
  Administered 2011-01-25: 11 via INTRAVENOUS

## 2011-01-25 MED ORDER — REGADENOSON 0.4 MG/5ML IV SOLN
0.4000 mg | Freq: Once | INTRAVENOUS | Status: AC
  Administered 2011-01-25: 0.4 mg via INTRAVENOUS

## 2011-01-25 MED ORDER — TECHNETIUM TC 99M TETROFOSMIN IV KIT
33.0000 | PACK | Freq: Once | INTRAVENOUS | Status: AC | PRN
  Administered 2011-01-25: 33 via INTRAVENOUS

## 2011-01-25 NOTE — Progress Notes (Signed)
[facility] SITE 3 NUCLEAR MED [address] [place] [address] [phone]  Cardiology Nuclear Med Study  Erskine Steinfeldt Deterding is a 71 y.o. male [id number] 12-31-1940   Nuclear Med Background Indication for Stress Test:  Evaluation for Ischemia History:  Cardioversion, COPD and 6/08 ION:GEXBM inferior infarct, no ischemia, 7/08 Cath:"no sig. CAD", 12/25/10 Echo:EF=45-50%, moderate LVH Cardiac Risk Factors: Claudication, History of Smoking, Hypertension, Lipids, NIDDM and Obesity  Symptoms:  Chest Pain with and without Exertion (last date of chest discomfort was last night), DOE, Fatigue, Palpitations and Rapid HR   Nuclear Pre-Procedure Caffeine/Decaff Intake:  None NPO After: 8:00pm   Lungs:  Minimal expiratory wheezes, recent bronchitis.  O2 sat 95% on RA.  Ventolin inhaler used prior to infusion. IV 0.9% NS with Angio Cath:  20g  IV Site: R Hand  IV Started by:  Irean Hong, RN  Chest Size (in):  48 Cup Size: n/a  Height: 5\' 8"  (1.727 m)  Weight:  222 lb (100.699 kg)  BMI:  Body mass index is 33.76 kg/(m^2). Tech Comments:Held metoprolol this am    Nuclear Med Study 1 or 2 day study: 1 day  Stress Test Type:  Lexiscan  Reading MD: Willa Rough, MD  Order Authorizing Provider:  Lewayne Bunting, MD  Resting Radionuclide: Technetium 59m Tetrofosmin  Resting Radionuclide Dose: 11 mCi   Stress Radionuclide:  Technetium 68m Tetrofosmin  Stress Radionuclide Dose: 33 mCi           Stress Protocol Rest HR: 83 Stress HR: 107  Rest BP: 137/82 Stress BP: 143/84  Exercise Time (min): n/a METS: n/a   Predicted Max HR: 151 bpm % Max HR: 70.86 bpm Rate Pressure Product: 84132   Dose of Adenosine (mg):  n/a Dose of Lexiscan: 0.4 mg  Dose of Atropine (mg): n/a Dose of Dobutamine: n/a mcg/kg/min (at max HR)  Stress Test Technologist: [employer], CMA-N  Nuclear Technologist:  Domenic Polite, CNMT     Rest Procedure:  Myocardial perfusion imaging was performed at  rest 45 minutes following the intravenous administration of Technetium 33m Tetrofosmin. Rest ECG: Atrial fibrillation with nonspecific ST-T wave changes; isolated PVC.  Stress Procedure:  The patient received IV Lexiscan 0.4 mg over 15-seconds.  Technetium 38m Tetrofosmin injected at 30-seconds.  There were no significant changes with Lexiscan, other than occasional PVC's.  Quantitative spect images were obtained after a 45 minute delay. Stress ECG: No significant change from baseline ECG  QPS Raw Data Images:  Normal; no motion artifact; normal heart/lung ratio. Stress Images:  Decreased activity in the inferior wall and inferior apex. Rest Images:  same as stress Subtraction (SDS):  No evidence of ischemia. Transient Ischemic Dilatation (Normal <1.22):  0.98 Lung/Heart Ratio (Normal <0.45):  0.30  Quantitative Gated Spect Images QGS EDV:  n/a QGS ESV:  n/a QGS cine images:  study not gated QGS EF: Study not gated  Impression Exercise Capacity:  Lexiscan with no exercise. BP Response:  Normal blood pressure response. Clinical Symptoms:  Felt Warm ECG Impression:  No significant ST segment change suggestive of ischemia. Comparison with Prior Nuclear Study: No significant change from previous study  Overall Impression:  There is evidence of old small/moderate infero-apical scar. Wall motion could not be assessed. There is no ischemia.   Willa Rough

## 2011-01-26 ENCOUNTER — Telehealth: Payer: Self-pay | Admitting: Physician Assistant

## 2011-01-26 NOTE — Progress Notes (Signed)
Copy routed to Dr. Ladona Ridgel.Bradley [name]

## 2011-01-26 NOTE — Telephone Encounter (Signed)
Please notify patient that his Myoview results are unchanged since 2008. Please keep follow up with Dr. Ladona Ridgel 5/3 as scheduled to discuss.

## 2011-01-27 NOTE — Telephone Encounter (Signed)
Pt aware of nuc results and to keep appt w/Dr. Ladona Ridgel 02/11/11, pt aware of this and gave verbal understanding today. Danielle Rankin

## 2011-01-28 ENCOUNTER — Ambulatory Visit (INDEPENDENT_AMBULATORY_CARE_PROVIDER_SITE_OTHER): Payer: Medicare Other | Admitting: *Deleted

## 2011-01-28 DIAGNOSIS — Z7901 Long term (current) use of anticoagulants: Secondary | ICD-10-CM

## 2011-01-28 DIAGNOSIS — I4891 Unspecified atrial fibrillation: Secondary | ICD-10-CM

## 2011-01-28 LAB — POCT INR: INR: 2.4

## 2011-02-08 ENCOUNTER — Ambulatory Visit (INDEPENDENT_AMBULATORY_CARE_PROVIDER_SITE_OTHER): Payer: Medicare Other | Admitting: Internal Medicine

## 2011-02-08 ENCOUNTER — Encounter: Payer: Self-pay | Admitting: Internal Medicine

## 2011-02-08 VITALS — BP 110/56 | HR 72 | Ht 68.0 in | Wt 223.0 lb

## 2011-02-08 DIAGNOSIS — I4891 Unspecified atrial fibrillation: Secondary | ICD-10-CM

## 2011-02-08 DIAGNOSIS — Z1211 Encounter for screening for malignant neoplasm of colon: Secondary | ICD-10-CM

## 2011-02-08 DIAGNOSIS — Z7901 Long term (current) use of anticoagulants: Secondary | ICD-10-CM

## 2011-02-08 MED ORDER — PEG-KCL-NACL-NASULF-NA ASC-C 100 G PO SOLR: 1.0000 | Freq: Once | ORAL | Status: AC

## 2011-02-08 NOTE — Patient Instructions (Signed)
Your procedure has been scheduled for 03/10/2011, please follow the seperate instructions. We will let you know about holding your Coumadin per Dr Ladona Ridgel.  Your prescription(s) have been sent to you pharmacy.

## 2011-02-10 ENCOUNTER — Encounter: Payer: Self-pay | Admitting: Internal Medicine

## 2011-02-10 ENCOUNTER — Telehealth: Payer: Self-pay | Admitting: *Deleted

## 2011-02-10 NOTE — Telephone Encounter (Signed)
Per Dr Ladona Ridgel patient can hold his Coumadin for 5 days, patient aware.

## 2011-02-10 NOTE — Progress Notes (Signed)
  Subjective:    Patient ID: Bradley [name], male    DOB: [birth year], 70 y.o.   MRN: [id number]  HPI 70 year old man with a no active GI symptoms at this time. He takes warfarin for atrial fibrillation. His primary care physician has recommended a screening colonoscopy.   Review of Systems Chest pain, no dyspnea on exertion at this time.    Objective:   Physical Exam Well-developed well-nourished no acute distress, overweight to obese. Mouth shows dentures Heart irregular but normal rate no murmurs or gallops Lungs are clear No he is alert and x3, there is an appropriate mood and affect       Assessment & Plan:  #1 routine risk colon cancer screening #2 anticoagulated, on warfarin for atrial fibrillation  Will arrange for a screening colonoscopy. I think it would be best if he had his warfarin held 3-5 days before. We will ask his cardiologist about that. Risks benefits and indications of the procedure were explained including the risk of stroke while off warfarin. Will continue aspirin during the procedure.

## 2011-02-11 ENCOUNTER — Ambulatory Visit (INDEPENDENT_AMBULATORY_CARE_PROVIDER_SITE_OTHER): Payer: Medicare Other | Admitting: Internal Medicine

## 2011-02-11 ENCOUNTER — Encounter: Payer: Self-pay | Admitting: Internal Medicine

## 2011-02-11 DIAGNOSIS — I4891 Unspecified atrial fibrillation: Secondary | ICD-10-CM

## 2011-02-11 DIAGNOSIS — R079 Chest pain, unspecified: Secondary | ICD-10-CM

## 2011-02-11 DIAGNOSIS — I10 Essential (primary) hypertension: Secondary | ICD-10-CM

## 2011-02-11 NOTE — Patient Instructions (Signed)
Your physician wants you to follow-up in: 12 months with Dr. Taylor. You will receive a reminder letter in the mail two months in advance. If you don't receive a letter, please call our office to schedule the follow-up appointment.    

## 2011-02-11 NOTE — Assessment & Plan Note (Signed)
His blood pressure is fairly well controlled. He will continue a low sodium diet.

## 2011-02-11 NOTE — Assessment & Plan Note (Signed)
His ventricular rate is fairly well controlled. He will continue his current meds.

## 2011-02-11 NOTE — Assessment & Plan Note (Signed)
His symptoms are quiet. He will continue his current medication.

## 2011-02-11 NOTE — Progress Notes (Signed)
HPI [name] returns today for followup. He had been seen by [name] with atypical c/p and worsening dyspnea. He had a myoview scan performed which demonstrated a small fixed inferior defect. He is scheduled to undergo colonoscopy. He notes that he feels better today. No chest pain. No peripheral edema. He is wearing stockings to help keep his peripheral edema controlled. He does still have evidence of venous insufficiency. Allergies  Allergen Reactions  . Tadalafil     REACTION: HA, backache     Current Outpatient Prescriptions  Medication Sig Dispense Refill  . ADVICOR 1000-20 MG 24 hr tablet Take 1 tablet by mouth daily.      Marland Kitchen aspirin 325 MG tablet Take 325 mg by mouth daily.        Marland Kitchen BAYER CONTOUR TEST test strip as directed.      Marland Kitchen dextromethorphan-guaiFENesin (MUCINEX DM) 30-600 MG per 12 hr tablet Take 1 tablet by mouth as needed.        . digoxin (LANOXIN) 0.25 MG tablet Take 1 tablet by mouth daily.      . furosemide (LASIX) 20 MG tablet Take 20 mg by mouth 2 (two) times daily.        Marland Kitchen glyBURIDE (DIABETA) 5 MG tablet Take 4 tablets by mouth daily.      Marland Kitchen JANUVIA 100 MG tablet Take 1 tablet by mouth daily.      Marland Kitchen losartan (COZAAR) 100 MG tablet Take 1 tablet by mouth daily.      Marland Kitchen LOTREL 10-40 MG per capsule Take 1 tablet by mouth daily.      . metFORMIN (GLUCOPHAGE) 850 MG tablet Take 1 tablet by mouth Twice daily.      . metoprolol (LOPRESSOR) 50 MG tablet Take 1 tablet by mouth Twice daily.      . Multiple Vitamin (MULTIVITAMIN) tablet Take 1 tablet by mouth daily.        Marland Kitchen warfarin (COUMADIN) 5 MG tablet Take by mouth as directed.        . zolpidem (AMBIEN) 10 MG tablet Take 10 mg by mouth at bedtime as needed.           Past Medical History  Diagnosis Date  . HTN (hypertension)   . HLD (hyperlipidemia)   . DM (diabetes mellitus) 1999    adult onset  . Permanent atrial fibrillation     Dr. Ladona Ridgel  . BPH (benign prostatic hyperplasia)   . Leg ulcer 2011  .  Chronic diastolic heart failure     Echocardiogram 12/25/10: EF 45-50%; moderate LVH; global hypokinesis; moderate biatrial enlargement; PASP 36;    Cardiac catheterization July 2008: No CAD; moderate pulmonary hypertension  . Pleural effusion 2008    Status post decortication    ROS:   All systems reviewed and negative except as noted in the HPI.   Past Surgical History  Procedure Date  . Pleural scarification   . Lung decortication   . Cardiac catheterization      Family History  Problem Relation Age of Onset  . Diabetes Father   . Breast cancer Maternal Aunt      History   Social History  . Marital Status: Married    Spouse Name: N/A    Number of Children: 1  . 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 Smoking status: Former Smoker -- 2.5 packs/day for 25 years    Types:  Cigarettes    Quit date: 10/11/1980  . Smokeless tobacco: Never Used  . Alcohol Use: Yes     occas  . Drug Use: No  . Sexually Active: Not on file   Other Topics Concern  . Not on file   Social History Narrative  . No narrative on file     BP 143/78  Pulse 78  Resp 14  Ht 5\' 8"  (1.727 m)  Wt 225 lb (102.059 kg)  BMI 34.21 kg/m2  Physical Exam:  Well appearing NAD HEENT: Unremarkable Neck:  No JVD, no thyromegally Lymphatics:  No adenopathy Back:  No CVA tenderness Lungs:  Clear with no wheezes, rales, or rhonchi. HEART:  Regular rate rhythm, no murmurs, no rubs, no clicks Abd:  Flat, positive bowel sounds, no organomegally, no rebound, no guarding Ext:  2 plus pulses, no edema, no cyanosis, no clubbing Skin:  No rashes no nodules Neuro:  CN II through XII intact, motor grossly intact   Assess/Plan:

## 2011-02-23 NOTE — Assessment & Plan Note (Signed)
[facility]                         ELECTROPHYSIOLOGY OFFICE NOTE   NAME:Arizola, BRISTON LAX                        MRN:          [id number]  DATE:11/14/2008                            DOB:          1941-04-12    [name] returns today for followup.  He is a very pleasant 70-year-  old male with a history of chronic atrial fibrillation.  He has history  of hypertension and congestive heart failure.  The patient has  longstanding pulmonary hypertension, the etiology is unclear as to the  cause.  He has a history of pleural effusions in the past as well.  We  last saw him about a year ago.  At that time, he was fairly stable.  He  has noted that in the last several months he has been bothered by  problems with lower extremity edema.  He was recently hospitalized  secondary to congestive heart failure (diastolic dysfunction) and  questionable pneumonia versus H1N1versus cellulitis.  He ultimately  improved and recovered and was discharged home.  He returns today for  followup.  Since discharge from the hospital, his peripheral edema has  been improved.  He still however has to have his legs wrapped from time  to time.  He clearly has venous insufficiency and active weeping or  oozing of his skin from time to time.   CURRENT MEDICATIONS:  1. Cartia XT 240 a day.  2. Cozaar 100 a day.  3. Toprol 50 a day.  4. Metformin 500 twice a day.  5. Aspirin 325 a day.  6. Multivitamin.  7. Januvia 100 mg daily.  8. Digoxin 0.25 daily.  9. Glyburide 5 mg 2 tablets twice daily.  10.Flomax 0.4 mg daily.  11.Warfarin 5 mg as directed.  12.Furosemide 20 twice a day.   PHYSICAL EXAMINATION:  GENERAL:  He is a pleasant 70 year old man in no  acute distress.  VITAL SIGNS:  Blood pressure was 106/70, the pulse was 68 and irregular,  the respirations were 18, the weight was 227 pounds down 4 pounds from a  year ago.  NECK:  No jugular venous distention.  There is no  thyromegaly.  Trachea  is midline.  Carotids are 2+ and symmetric.  LUNGS:  Clear bilaterally to auscultation except for rales in the bases  bilaterally.  There is no increased work of breathing.  There is no  wheezes or rhonchi.  CARDIOVASCULAR:  Irregular rhythm with normal S1 and S2.  The heart  sounds were distant.  There were no obvious murmurs, rubs, or gallops.  ABDOMEN:  Obese, nontender, and nondistended.  There is no organomegaly.  The bowel sounds are present.  There is no rebound or guarding.  EXTREMITIES:  Trace peripheral edema bilaterally with evidence of venous  insufficiency.   IMPRESSION:  1. Chronic atrial fibrillation with controlled ventricular response.  2. Diastolic heart failure (multifactorial).  3. Chronic atrial fibrillation.  4. Congestive heart failure with diastolic dysfunction in conjunction      with pulmonary hypertension.   DISCUSSION:  Mr. Agostinelli is stable today despite his multitude of medical  problems.  I have recommended that he continue his current medical  regimen, that he maintain a low-sodium diet, that he keep his legs  elevated, and that he try to walk as much as possible.  I will see the  patient back in 6 months.     Doylene Canning. Ladona Ridgel, MD  Electronically Signed    GWT/MedQ  DD: 11/14/2008  DT: 11/15/2008  Job #: [id number]

## 2011-02-23 NOTE — Assessment & Plan Note (Signed)
OFFICE VISIT   CHANNING, YEAGER  DOB:  [birth year]                                        June 20, 2007  CHART #:  [id number]   REASON FOR VISIT:  Bradley [name] returned today for followup after CT scan  of the chest to reassess the right pleural effusion.  He said that he is  feeling about the same.  He said that he has mild shortness of breath  when he lies flat, but has been able to perform some activities without  any symptoms at all.  He has had no fevers or chills and has no cough or  sputum production.   PHYSICAL EXAMINATION:  VITAL SIGNS:  Blood pressure 135/76, pulse 55 and  regular, respirations 19 and unlabored, oxygen saturations 93% on room  air.  GENERAL:  He looks well.  LUNGS:  Decreased breath sounds at the right base.   CT scan of the chest reveals a small, loculated, right hydropneumothorax  at the base posteriorly.  There appears to be a thickened peel over the  right lower lobe with some atelectasis.   IMPRESSION/PLAN:  Mr. Barcellos is doing well clinically.  The residual  right hydropneumothorax appears small and there is a thick peel over the  right lower lobe suggesting that even if we drained out this  hydropneumothorax that he would likely have a persistent space that  would reaccumulate fluid or may become infected.  I think the best  option at this time would be to continue following this.  Hopefully, he  will gradually resolve this on his own.  If he does develop an enlarging  pleural fluid collection, then I would recommend proceeding with  surgical drainage.  This would likely require a small thoracotomy.  I  will plan to see him back in about 2 months with a repeat chest x-ray to  reassess this.  I asked him to contact my office for a followup  appointment sooner if he develops any worsening of his symptoms, fever  or chills.  He and his wife are in full agreement with this plan.   Evelene Croon, M.D.  Electronically  Signed   BB/MEDQ  D:  06/20/2007  T:  06/21/2007  Job:  [id number]   cc:   Charlaine Dalton. [name], MD, FCCP

## 2011-02-23 NOTE — Cardiovascular Report (Signed)
NAME:  Bradley [name], RIEVES NO.:  [id number]   MEDICAL RECORD NO.:  [id number]          PATIENT TYPE:  OIB   LOCATION:  1963                         FACILITY:  [facility]   PHYSICIAN:  Arturo Morton. Riley Kill, MD, FACCDATE OF BIRTH:  August 16, 1941   DATE OF PROCEDURE:  05/10/2007  DATE OF DISCHARGE:                            CARDIAC CATHETERIZATION   INDICATIONS:  Mr. Yonker is a very pleasant 70 year old gentleman who is  known to Dr. Ladona Ridgel.  He has persistent atrial fibrillation requiring  warfarin anticoagulation.  He recently has had increasing chest pain.  Importantly, the patient has had previous a evaluation and did not have  critical disease.  The current study was done to assess both right and  left heart pressures as well as coronary arteriography.   PROCEDURES:  1. Right heart catheterization  2. Selective coronary arteriography.   DESCRIPTION OF PROCEDURE:  The patient was brought to the  catheterization laboratory and prepped and draped in the usual fashion.  Through an anterior puncture using a Smart needle, the right femoral  artery was entered.  We placed a 4-French sheath.  There was a fair  amount of tortuous iliac anatomy, making some manipulation of the  catheters difficult but we were able to engage both the left and right  coronary arteries.  Because of the presence of echo data and difficulty  passing the catheters up the iliac, we elected not to do additional  iliac shots, including passing the pigtail catheter and we elected not  to upgrade and perform ventriculography.  Following this, the Smart  needle was used in the right femoral vein and a 7-French sheath was  placed.  A Swan-Ganz catheter was then passed into the superior vena  cava, where a saturation was obtained.  It was 58%.  Following this, the  catheter was passed up into the pulmonary artery where a second  saturation was obtained and was 62%.  Aortic saturation was subsequently  obtained  and was 92%.  Thermodilution cardiac outputs were performed.  Subsequently all catheters removed and hemostasis was achieved by direct  manual compression on the table.  The patient was taken to the holding  area in satisfactory clinical condition.   HEMODYNAMIC DATA:  1. Right atrial pressure was 15.  2. RV was 46/11 with a mean of 18.  3. PA was 42/19 with a mean of 29.  4. Pulmonary capillary wedge was 25 at end expiration.  5. Aortic was 112/79.  6. Cardiac output by Fick 3.9 L/min.  Cardiac index 1.81 L/min. per      sq. m.  7. Thermodilution cardiac output 5.60 L/min.  8. Thermodilution cardiac index 2.60 L/min. per sq. m.   ANGIOGRAPHIC DATA:  1. The left main was large and free of critical disease.  2. The LAD coursed to the apex.  There was a major diagonal branch.      The LAD and its branches were free of critical disease.  They      appeared to be relatively smooth throughout.  3. The circumflex was a large-caliber system.  There did not appear to      be critical stenosis of any of the branches.  4. The right coronary artery provided predominantly a PDA with minimal      luminal irregularity and no critical stenosis.   CONCLUSIONS:  1. Coronary arteriography demonstrated no evidence of high-grade      coronary obstruction.   1. Moderate pulmonary hypertension, question secondary to diastolic      dysfunction.   RECOMMENDATIONS:  The patient may require a sleep study, and I have  discussed this with Dr. Ladona Ridgel.  We will have him follow up with Dr.  Ladona Ridgel in about a week.  In addition, I would consider performing a CT  angiogram of the aorta.  The patient has a fair amount of iliac  tortuosity, and also there is some calcification of the aortic knob.  With his history of chest pain, chronic Coumadin therapy thus making  pulmonary embolus less likely, and lack of significant coronary  obstruction, and with increasing chest discomfort, CT would be helpful  to rule  out some form of alternative answer such as chronic dissection.  This will be arranged as an outpatient with Dr. Ladona Ridgel.      Arturo Morton. Riley Kill, MD, [facility]  Electronically Signed     TDS/MEDQ  D:  05/10/2007  T:  05/11/2007  Job:  [id number]   cc:   Doylene Canning. Ladona Ridgel, MD  [facility]

## 2011-02-23 NOTE — Discharge Summary (Signed)
NAME:  Bradley [name], Bradley [name] NO.:  [id number]   MEDICAL RECORD NO.:  [id number]          PATIENT TYPE:  INP   LOCATION:  1437                         FACILITY:  [facility]   PHYSICIAN:  Rosalyn Gess. Norins, MD  DATE OF BIRTH:  01-09-1941   DATE OF ADMISSION:  09/09/2008  DATE OF DISCHARGE:  09/17/2008                               DISCHARGE SUMMARY   PRIMARY CARE PHYSICIAN:  Dr. [name].   DISCHARGE DIAGNOSES:  1. Fever and chills at time of admission with right lower extremity      cellulitis and bilateral left greater than right pulmonary      infiltrates.  2. Acute respiratory failure secondary to pulmonary infiltrates.  3. Atrial fibrillation with rapid ventricular response.   HISTORY OF PRESENT ILLNESS:  Bradley [name] is a 70 year old white male with  past medical history of chronic atrial fibrillation, hypertension,  diabetes and diastolic heart failure who presented to [facility]  emergency room on day of admission with reports of shortness of breath.  The patient initially required BiPAP upon arrival to emergency room with  improvement of symptoms.  Initial workup obtained in the ER revealed  mildly elevated BNP at 102, white cell count 15.3.  The patient with O2  saturation of 81% on 4 liters of oxygen by nasal cannula at time of  admission.  Chest x-ray revealed right greater than left interstitial  air space disease consistent with infection versus pulmonary edema.  Due  to the patient's low O2 saturation with fever and leukocytosis, the  patient was admitted to step-down unit for further evaluation and  treatment.   PAST MEDICAL HISTORY:  1. Chronic atrial fibrillation.  2. Hypertension.  3 Type 2 diabetes.  4 Chronic diastolic heart failure.  5  Status post large pneumothorax with fibrothorax.  6  Status post cardiac catheterization July 2009 with no evidence of  high-grade coronary obstruction revealing moderate pulmonary  hypertension.   CONSULTATIONS:   During this admission.  1. Pulmonary critical care medicine  2  Infectious disease.   COURSE OF HOSPITALIZATION:  1. Acute respiratory failure thought secondary to bilateral pulmonary      infiltrates.  Again, the patient was admitted to step-down unit at      time of admission for low O2 sats, fever and leukocytosis.  The      patient was started on empiric vancomycin and Zosyn.  Blood      cultures remained negative throughout hospitalization with negative      urine Legionella, negative strep antigen and negative H1N1.  The      patient also completed course of empiric Tamiflu given symptoms.      Patient with slow clinical improvement with decreased white cell      count and negative blood cultures as well.  In addition, the      patient has remained afebrile greater than 72 hours at time of      discharge.  The patient has completed a complete course of IV      vancomycin, Rocephin and azithromycin as well as empiric  Tamiflu      during hospitalization.  O2 sats remained stable at this time.  2. Right lower extremity cellulitis.  Question whether or not the      patient's initial symptoms of fever and leukocytosis secondary to a      right lower extremity cellulitis that began to appear on September 12, 2008, during hospitalization.  The patient underwent venous      Dopplers which were negative for lower extremity DVTs.  The      patient's blood cultures were reobtained which were negative for      any growth.  Infectious disease was asked to see the patient in      consultation as right lower extremity extremely erythematous as      well as weeping serous fluid.  The patient treated with course of      clindamycin.  Again, fever and chills have resolved at this time.      The patient's right lower extremity remained swollen and dusky red.      However, it does not appear that there are any signs of acute      infection.  Infectious disease had recommended the patient be       discontinued home with no continuation of antibiotics at this time.      Again, the patient has completed a course of Rocephin,      azithromycin, clindamycin and vancomycin.  3. Atrial fibrillation with rapid ventricular response.  Exacerbation      of condition due to the patient's fever and active infection, now      controlled with resolution of infection.  The patient to continue      on medications as prior to admission at time of discharge with      continued anticoagulation therapy.   MEDICATIONS:  1. Norvasc 10 mg p.o. daily.  2. Benazepril 20 mg p.o. daily.  3. Digoxin 0.25 mg p.o. daily.  4. Lopressor 25 mg p.o. b.i.d.  5. Cardizem 240 mg p.o. daily.  6. Aspirin 324 mg p.o. daily.  7. Lasix 40 mg p.o. daily.  8. Glyburide 5 mg p.o. daily.  9. Cozaar 100 mg p.o. daily.  10.Lovastatin p.o. daily.  11.Niacin 20 mg p.o. daily.  12.Metformin 500 mg p.o. b.i.d.   DISPOSITION:  The patient felt medically stable for discharge home at  this time.  The patient is instructed to wrap right lower leg with  Kerlix over gauze pads, then wrap with 4 inches Ace wrap snugly.  As the  patient's swelling continues to improve, the patient will likely need  thigh high support stockings bilaterally to lower extremities.  The  patient is instructed to follow up with his primary care physician Dr.  [name] on Tuesday December 15 at 2:00 p.m. for reassessment of  respiratory status and cellulitis.   Greater than 30 minutes spent on discharge planning.      Cordelia Pen, NP      Rosalyn Gess. Norins, MD  Electronically Signed    LE/MEDQ  D:  09/17/2008  T:  09/17/2008  Job:  [id number]   cc:   [name], MD  [phone] [address] [place], [place] [address]

## 2011-02-23 NOTE — Assessment & Plan Note (Signed)
[facility]                         ELECTROPHYSIOLOGY OFFICE NOTE   NAME:Vespa, DRAKE WUERTZ                        MRN:          [id number]  DATE:05/02/2007                            DOB:          14-Jun-1941    REASON FOR VISIT:  Mr. Sporn returns today for followup.  He is a very  pleasant middle aged male with a history of hypertension, chronic atrial  fibrillation, chronic Coumadin therapy and diastolic heart failure who  notes that over the last several weeks he has had increasingly worse  shortness of breath and chest pain with exertion.  His episodes resulted  in stress testing which was carried out several weeks ago.  I have  reviewed the results of this which demonstrate the patient has fairly  significant defect which appears to be predominantly scar although I  cannot rule out some ischemia in the apex and inferior wall.  Compared  to his prior scan these defects are much more pronounced.  He returns  today for followup.   MEDICATIONS:  1. Cardia 240 mg daily.  2. Cozaar 100 mg daily.  3. Toprol 50 mg daily.  4. Metformin 500 mg twice a day.  5. Aspirin.  6. Multivitamin.  7. Januvia.   PHYSICAL EXAMINATION:  GENERAL APPEARANCE:  He is a pleasant but  somewhat ill-appearing middle aged male who is obese but in no acute  distress.  VITAL SIGNS: Blood pressure 117/76, pulse was 87 and irregular.  Respirations were 18.  Weight was 240 pounds.  NECK:  Revealed no jugular venous distention.  LUNGS:  Clear to auscultation bilaterally except for rales in the bases  bilaterally.  There are no wheezes or rhonchi.  CARDIOVASCULAR:  Distant with an irregularly irregular rhythm, normal S1  and S2.  ABDOMEN:  Obese, nontender, nondistended.  EXTREMITIES:  Demonstrated 1+ woody edema bilaterally.   CLINICAL DATA:  EKG demonstrates atrial fibrillation.  There are no  acute ST-T wave changes.   IMPRESSION:  1. Increasingly frequent shortness of  breath and chest discomfort.  2. Abnormal stress test.  3. Chronic atrial fibrillation.  4. Hypertension.   DISCUSSION:  I have discussed the treatment options with the patient.  I  am concerned that he has developed coronary artery disease and this is  the etiology of his symptoms.  He certainly  could have worsening diastolic heart failure though the chest discomfort  is atypical for this and I  have recommended that he undergo left and right heart catheterization.  We scheduled the earliest possible convenient time.     Doylene Canning. Ladona Ridgel, MD  Electronically Signed    GWT/MedQ  DD: 05/02/2007  DT: 05/03/2007  Job #: [id number]

## 2011-02-23 NOTE — Consult Note (Signed)
NEW PATIENT CONSULTATION   Bradley [name], Bradley [name]  DOB:  06-21-1941                                        June 13, 2007  CHART #:  [id number]   REASON FOR CONSULTATION:  Right pleural effusion with shortness of  breath.   CLINICAL HISTORY:  I was asked to evaluate this 70 year old gentleman by  Dr. Sandrea Hughs for consideration of fluoroscopic drainage of a right  pleural effusion.  The patient is a 70 year old white male with a remote  smoking history who reports having recurrent episodes of pneumonia in  the distant past.  He also has a history of acute and chronic diastolic  heart failure associated with atrial fibrillation and has been treated  with Coumadin.  The patient reports being much more short of breath over  the previous 3 months.  This occurs with any type of activity.  The  patient reports that he is having orthopnea and now sleeping on 1  pillow.  He had a chest x-ray performed that showed a large right  pleural effusion and subsequently had a CT scan performed which showed a  large right pleural effusion in the base and located posteriorly that  was concerning for possible empyema or hemothorax.  The patient  underwent an ultrasound-guided thoracentesis on 05/24/2007, withdrawing  580 cc of bloody fluid.  This was reportedly negative for malignancy.  The patient said that his shortness of breath improved right after the  procedure, but he has remained short of breath.   REVIEW OF SYSTEMS:  GENERAL:  He denies any fever or chills.  He has had  some fatigue.  He reports some weight gain.  EYES:  Negative.  ENT:  Negative.  ENDOCRINE:  He has diabetes since age 63.  He denies hypothyroidism.  CARDIOVASCULAR:  He denies chest pain or pressure.  He has had shortness  of breath with exertion and when he lies flat.  He reports a history of  atrial fibrillation and having been cardioverted twice, but would not  hold in sinus rhythm.  He reports having  a heart murmur as a child.  PULMONARY:  He reports some cough but denies sputum production.  He  denies hemoptysis.  GI:  He denies nausea or vomiting.  He has had no melena or bright red  blood per rectum.  GU:  He denies dysuria and hematuria.  VASCULAR:  He denies claudication and phlebitis.  NEUROLOGICAL:  He denies any focal weakness or numbness.  Denies  dizziness and syncope.  He has never had a TIA or a stroke.  MUSCULOSKELETAL:  He does report arthritis and joint pain.  PSYCHIATRIC:  Negative.  HEMATOLOGICAL:  Negative.  ALLERGIES:  None.   MEDICATIONS:  1. Cartia 240 mg daily.  2. Toprol 50 mg daily.  3. Flomax 0.4 mg daily.  4. Digoxin 0.25 mg daily.  5. Advicor 1000/20 daily.  6. Cozaar 100 mg daily.  7. Lotrel 10/40 daily.  8. Januvia 100 mg daily.  9. Lasix 40 mg daily.  10.Coumadin 5 mg daily.  11.Glyburide 10 mg b.i.d.  12.Metformin 500 mg daily.  13.Aspirin 325 mg daily.  14.Multivitamin daily.   PAST MEDICAL HISTORY:  1. Diabetes and hypertension, as mentioned above.  2. He has a history of diastolic heart failure.  3. He has a history of  atrial fibrillation, treated with Coumadin.  4. He has a history of recent cardiac catheterization in 04/2007 that      showed a wedge pressure of 25 and a mean PA pressure of 29, with a      thermodilution cardiac output of 5.6, with no high-grade coronary      stenoses.   SOCIAL HISTORY:  He quit smoking 28 years ago.  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   FAMILY HISTORY:  Negative.   PHYSICAL EXAMINATION:  His blood pressure is 129/71, and his pulse is 68  and regular, respiratory rate is 18 and unlabored, oxygen saturation on  room air is 93%.  He is a well-developed white male in no distress.  HEENT exam shows him to be normocephalic and atraumatic.  Pupils are  equal and reactive to light and accommodation.  Extraocular muscles are  intact.  His throat is clear.  Neck exam shows normal carotid  pulses  bilaterally.  There are no bruits.  There is no adenopathy or  thyromegaly.  There is no JVD.  Cardiac exam shows a regular rate and  rhythm.  There is no murmur, rub, or gallop.  His lungs are clear, with  diminished air movement over the right lower lobe.  Abdominal exam shows  active bowel sounds.  His abdomen is soft, obese, and nontender.  There  are no palpable masses or organomegaly.  Extremity exam shows chronic  venous changes in both lower legs.  There is no significant edema.   IMPRESSION:  The patient has had a history of shortness of breath since  11/2006.  In retrospect, he reports having been hospitalized for  possible pneumonia at that time, and says he has had shortness of breath  since then that has not gotten better.  He was found to have a  significant right pleural effusion in early 05/2007, and a CT scan  showed a complex collection that was felt to either be an empyema or a  hemothorax.  This was treated with thoracentesis, with drainage of 580  cc.  No etiology has been identified.  The patient had a follow-up chest  x-ray right after the procedure that showed some residual effusion and a  small basilar pneumothorax.  He has not had a further study since then.  He continues to have some complaints of shortness of breath, although he  does not look dyspneic at this time.  We will send him for a repeat CT  scan of the chest to reassess the effusion before deciding whether  surgical drainage is necessary.  I will plan to see him back in the  office next Tuesday after he has the CT scan.  He certainly has other  reasons for shortness of breath, including this history of acute and  chronic diastolic heart failure.   Evelene Croon, M.D.  Electronically Signed   BB/MEDQ  D:  06/13/2007  T:  06/14/2007  Job:  [id number]   cc:   Charlaine Dalton. [name], MD, FCCP

## 2011-02-23 NOTE — Assessment & Plan Note (Signed)
[facility]                         ELECTROPHYSIOLOGY OFFICE NOTE   NAME:Trella, GRASON BRAILSFORD                        MRN:          [id number]  DATE:05/16/2007                            DOB:          [birth year]    Mr. Grosso returns today for followup.  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  At the time of his catheterization, there was concern about  thoracic aortic aneurysm, based on the movement of the guidewire and  catheter during the case, and he underwent CT scan of the chest.  This  was carried out earlier today and was found to demonstrate a normal  aorta but a large right complex pleural collection of fluid,  questionable empyema, questionable hematoma.  He now returns today for  followup.  The patient overall has been stable.  He continues to have  dyspnea with exertion.  He denies chest pain.  He has had no fevers or  chills and otherwise looks well.  He has a history of tobacco use in the  distant past.  Overall, his LV function is normal.  He does have at  least moderate tricuspid regurgitation, and as noted before he has  elevation of his pulmonary pressures.   PHYSICAL EXAMINATION:  GENERAL:  Today he is a pleasant, well-appearing  but morbidly obese man in no acute distress.  VITAL SIGNS:  The blood pressure was 114/72.  The pulse was 71 and  regular.  The respirations were 18.  The weight was 237 pounds.  NECK:  Revealed 7 cm jugular venous distention.  LUNGS:  Revealed rales in the bases, right greater than left.  There  were no wheezes or rhonchi.  CARDIOVASCULAR:  Revealed a regular rate and  rhythm with normal S1 and  S2.  ABDOMEN:  Obese, nontender, nondistended.  There is no organomegaly.  EXTREMITIES:  Demonstrate a trace woody edema.   MEDICATIONS:  Include:  1. Cardiac 240 XL daily.  2. Cozaar 100 a day.  3. Toprol 50 a day.  4. Metformin 500 twice daily.  5. Aspirin a day.  6. Januvia 100 daily.  7. Digoxin 0.25 daily.  8. Glyburide.  9. Flomax.  10.Warfarin.  11.Furosemide 20 twice daily.   EKG demonstrates atrial fibrillation with a controlled ventricular  response.   IMPRESSION:  1. Chronic atrial fibrillation.  2. Pulmonary hypertension (questionable etiology), question secondary      to left heart failure/diastolic dysfunction versus intrinsic      pulmonary artery disease, i.e. primary pulmonary hypertension      versus other causes.  3. Abnormal chest CT with complex pleural effusion, questionable      empyema, questionable hematoma by report.   DISCUSSION:  Overall, Mr. Wesby does not appear ill-appearing, but to  me would suggest against  an empyema.  I would like to have him be seen  by one of our pulmonologist, and because his wife was seen by Dr. Francella Solian, the family requests to go back to him.  He may also require a VATS  procedure to be done or perhaps a diagnostic thoracentesis could be  accomplished.  Alternatively, a period of watchful waiting might be  appropriate, but I would like to hear [name] comments regarding  this.  Will plan to see Mr. Mcshan back in several months.     Doylene Canning. Ladona Ridgel, MD  Electronically Signed    GWT/MedQ  DD: 05/16/2007  DT: 05/17/2007  Job #: [id number]   cc:   Charlaine Dalton. [name], MD, FCCP

## 2011-02-23 NOTE — Assessment & Plan Note (Signed)
[facility]                             PULMONARY OFFICE NOTE   NAME:[name] [name] MICHAELSEN                        MRN:          [id number]  DATE:05/17/2007                            DOB:          1941-06-04    REASON FOR CONSULTATION:  Pleural effusion.   HISTORY:  A 70 year old white male, remote smoker who carries a  diagnosis of acute on chronic diastolic heart failure associated with  atrial fibrillation chronically and is now seen at the request of Dr.  Ladona Ridgel for evaluation of a right pleural effusion.  He senses that he  has been much more short of breath over the last 3 months with activity  and is having to sleep on one pillow which is new for him.  He denies  any recent history of pneumonia but remembers he was hospitalized with  pneumonia at [facility] (I could not actually find any record of this on the  eChart so it probably was before 2005, although he thinks it may have  been within the last several months).  He denies any pleuritic pain,  fevers, chills, sweats, myalgias, arthralgias.  He has had leg swelling  but says it is better than usual now and does not correlate with his  dyspnea.   PAST MEDICAL HISTORY:  1. Hypertension complicated by CHF with diastolic dysfunction as noted      for which he is taking chronic Coumadin.  His most recent cardiac      catheterization was done as recently as July 30 and indicates that      he has a wedge pressure of 25 with a mean PA pressure of 29 and a      thermodilution cardiac output of 5.6 with no high grade coronary      obstruction.  2. Diabetes.   ALLERGIES:  NONE KNOWN.   MEDICATIONS:  Note that he is on Coumadin and ACE inhibitors.  For full  inventory please see face sheet comment dated May 17, 2007.   SOCIAL HISTORY:  He quit smoking 28 years ago.  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   FAMILY HISTORY:  Negative for respiratory diseases or lung cancer or  rheumatologic disease.   REVIEW OF SYSTEMS:  Taken in detail on the worksheet, negative except as  outlined above.  Patient denies any trauma to the right chest wall.   PHYSICAL EXAMINATION:  This an obese, ambulatory, pleasant white male in  no acute distress but has very difficult time with details regarding  recent illnesses (note that he could not tell me when he was admitted  with pneumonia, thinks it was somewhere between 6 months and 6 years  ago).  He is afebrile, normal vital signs with a weight of 234 pounds.  HEENT:  He is edentulous, oropharynx is clear.  NECK:  Supple without cervical adenopathy, tenderness, trachea is  midline, no thyromegaly.  LUNGS:  Fields reveal decreased breath sounds at the right base.  Overall air movement is diminished.  There is no localized generalized  wheezing or rhonchi.  There is  irregular rhythm without gallop or rub with a 1/6 systolic  ejection murmur.  ABDOMEN:  Obese but otherwise benign.  EXTREMITIES:  Warm without calf tenderness, cyanosis, clubbing, edema.   CT scan of the chest was reviewed from May 16, 2007 and indicates a  complex right pleural effusion, consistent with either empyema or  hematoma (note that he has not had any trauma, there is no ecchymosis  over the area and his hematocrit is 42%).  The absence of a history of  recent pneumonia also is against pneumonia but on how poor he is as a  historian this very well could have been a pneumonia with parapneumonic  process.   He had a sed rate of 66 today and a BNP of 92 with a hematocrit of  42.6%.   IMPRESSION:  Large right pleural effusion in patient on chronic Coumadin  without a history of trauma.  The complex nature of the effusion points  to an either empyema or hematoma.  Because of his body habitus and the  complex nature of the effusion I do not believe this can be tapped as an  outpatient and therefore recommended an ultrasound guided thoracentesis  to be obtained 5 days after  stopping Coumadin and we will check the  results of this study before proceeding further.   I spent extra time with this patient and his wife today discusssin  risks/benefits, and alternatives of the various approaches and the  differential.     [name] Needle B. [name], MD, [facility]  Electronically Signed    MBW/MedQ  DD: 05/18/2007  DT: 05/18/2007  Job #: [id number]   cc:   Doylene Canning. Ladona Ridgel, MD

## 2011-02-23 NOTE — Assessment & Plan Note (Signed)
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  He had an uncomplicated postoperative course.  Since discharge he  has been feeling well and has said his shortness of breath has resolved.   PHYSICAL EXAMINATION:  Vital signs:  His blood pressure is 145/81 and  his pulse is 77 and irregular.  Respiratory rate is 18 and unlabored.  Oxygen saturation on room air is 93%.  He looks well.  Cardiac:  The  cardiac exam shows an irregular rate and rhythm.  Normal heart sounds.  Lungs:  His lung exam is clear.  The right thoracotomy incision is well-  healed.  The chest tube suture was removed.  There is a small amount of  eschar at the chest tube site.  There is no sign of infection.   A follow-up chest x-ray today shows improving aeration at the right base  with mild residual thickening of the pleura at the right costophrenic  angle.  There was no pneumothorax and no apparent space.   IMPRESSION:  Overall Bradley [name] appears to be making a good recovery  following his procedures.  He has resumed his Coumadin at the time of  discharge and said he is having his INR checked again tomorrow.  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  He should refrain from lifting anything heavier than 10 pounds for a  total  of 12 weeks following surgery.  He will continue to follow up with Dr.  Drue Novel, who is his primary care physician and will contact me if he  develops any problems with his incisions.   Evelene Croon, M.D.  Electronically Signed   BB/MEDQ  D:  09/05/2007  T:  09/05/2007   Job:  [id number]   cc:   Charlaine Dalton. [name], MD, [facility]  [name], MD

## 2011-02-23 NOTE — Discharge Summary (Signed)
NAME:  Bradley [name], Bradley [name] NO.:  [id number]   MEDICAL RECORD NO.:  [id number]          PATIENT TYPE:  INP   LOCATION:  3306                         FACILITY:  [facility]   PHYSICIAN:  Evelene Croon, M.D.     DATE OF BIRTH:  1941-09-10   DATE OF ADMISSION:  08/18/2007  DATE OF DISCHARGE:  08/22/2007                               DISCHARGE SUMMARY   PRIMARY ADMITTING DIAGNOSIS:  Recurrent right pleural effusion.   ADDITIONAL/DISCHARGE DIAGNOSES:  1. Large right hydropneumothorax.  2. Progressive dyspnea secondary to hydropneumothorax.  3. History of atrial fibrillation on chronic Coumadin therapy.  4. Chronic diastolic heart failure.  5. Type 2 diabetes mellitus.  6. Hypertension.  7. History of tobacco abuse.  8. Benign prostatic hypertrophy.   PROCEDURES PERFORMED:  Right thoracotomy with drainage of large old  clotted hemothorax and decortication of right lower lobe.   HISTORY:  The patient is a 70 year old male who presented initially with  a several month history of worsening dyspnea and orthopnea. A chest x-  ray showed a large right pleural effusion and a CT scan subsequently  showed a large right pleural effusion in the base located posteriorly  that was concerning for possible empyema or hemothorax.  Of note, he has  been on chronic Coumadin therapy for atrial fibrillation.  He underwent  an ultrasound-guided thoracentesis on May 24, 2007 with drainage of  580 mL of bloody fluid which was negative for malignancy.  His symptoms  improved after the procedure but then became progressively worse. A CT  scan was repeated and this showed a recurrence of a large right  hydropneumothorax with a thickened peel over the right lower lobe  suggesting a trapped lung.  He was subsequently referred to Dr. Laneta Simmers  for consideration of surgical intervention. Dr. Laneta Simmers felt that his  best course of action would be to proceed with a right thoracotomy at  this time for  drainage of the fluid and decortication of the lung.  He  explained the risks, benefits and alternatives of the procedure to the  patient and he agreed to proceed.   HOSPITAL COURSE:  Bradley [name] was admitted to [facility] on August 18, 2007 and underwent a right thoracotomy with drainage of a hemothorax and  decortication.  He tolerated the procedure well and was transferred to  the intermediate care unit in stable condition. Postoperatively he has  done well.  His pulmonary status has remained stable and he has been  weaned off supplemental oxygen. His chest x-rays have improved and his  chest tube has been removed at this point.  He has had no evidence of  pneumothorax following his chest tube removal.  He has remained in rate  controlled atrial fibrillation.  He has been afebrile and otherwise his  vital signs have been stable.  His most recent labs showed a hemoglobin  of 10.8, hematocrit 32, white count 12.1, platelets 352, sodium 133,  potassium 4.7, BUN 9, creatinine 0.80. He has been restarted on all of  his home medications except his Coumadin but it is  felt that he may  resume this as an outpatient.  His incisions are all healing well.  He  is tolerating a regular diet and is having normal bowel and bladder  function.  He is ambulating in the halls without difficulty.  Dr. Laneta Simmers  has seen and examined the patient on August 22, 2007 and it is felt  that he may be discharged home at this time.   DISCHARGE MEDICATIONS:  1. Coumadin alternating  5 and 3.5 mg every other day.  2. Ultram 50-100 mg q.4-6 h p.r.n. pain.  3. Cartia XT 240 mg.  4. Furosemide 20 mg daily.  5. Toprol XL 50 mg daily.  6. Flomax 0.4 mg q.h.s.  7. Digoxin 0.25 mg daily.  8. Advicor 1000/20 mg one q.h.s.  9. Cozaar 100 mg daily.  10.Lotrel 10/40 one daily.  11.Aspirin 325 mg daily.  12.Januvia 100 mg daily.  13.Glyburide 5 mg daily.  14.Glucophage 500 mg b.i.d.  15.Ambien 10 mg q.h.s.   16.Mucinex 600 mg p.r.n.  17.Multivitamin one daily.   DISCHARGE INSTRUCTIONS:  He is asked to refrain from driving, heavy  lifting or strenuous activity.  He may continue ambulating daily and  using his incentive spirometer.  He may shower daily and clean his  incisions with soap and water.  He will continue his same preoperative  diet.   DISCHARGE FOLLOWUP:  He will need to see Dr. Laneta Simmers back in the office  on September 05, 2007 with a chest x-ray from [facility].  He  will also need to follow up within the next week with [facility] for management of his anticoagulation.  In the interim if he  experiences problems or has questions, he is asked to contact our  office.      Coral Ceo, P.A.      Evelene Croon, M.D.  Electronically Signed    GC/MEDQ  D:  08/22/2007  T:  08/23/2007  Job:  [id number]   cc:   Charlaine Dalton. [name], MD, [facility]  [facility]

## 2011-02-23 NOTE — H&P (Signed)
NAME:  Bradley [name], Bradley [name] NO.:  [id number]   MEDICAL RECORD NO.:  [id number]          PATIENT TYPE:  INP   LOCATION:  4740                         FACILITY:  [facility]   PHYSICIAN:  Joylene Moua, MD       DATE OF BIRTH:  Feb 24, 1941   DATE OF ADMISSION:  03/29/2009  DATE OF DISCHARGE:                              HISTORY & PHYSICAL   REASON FOR ADMISSION:  Disorientation, generalized weakness, and fever  which started this morning.   HISTORY OF PRESENT ILLNESS:  This is a 70 year old white male with past  medical history of CHF, hypertension, diabetes, chronic AFib coming in  with confusion, disorientation, and generalized weakness that started  this morning.  Most of the history is obtained from the patient's wife  and the patient.  According to the patient and the wife, he was fine  yesterday before bedtime at his baseline.  According to the wife, this  morning when he woke up he was unable to walk to the bathroom due to  generalized weakness.  He was confused at that time, asked her what time  it was over and over, and then fell back into the bed.  The patient's  wife found him to be hot to touch with a fever, she did not measure the  temperature.  The patient's wife gave him some aspirin and then brought  him to the ER.  According to the wife, he had an identical presentation  about 6 months ago when he was admitted in the hospital, at that time it  was thought that infection from his right lower extremity was causing  the symptoms.  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  The patient is  back to his baseline according to her.  According to the patient, the  right lower extremity has been getting redder over the last 2 weeks and  hot to touch for last couple of days.  The patient denied any fevers,  chills, nausea, vomiting, shortness of breath, any recent travels, or  sick contacts.  The  patient denies any chest pain or pain at the right  lower extremity.  According to the patient, he has been a diabetic for  13 years.  Wife tells me that the patient was on several different  antibiotics at last hospitalization for his cellulitis.  The patient  does take all his pills including Coumadin, which he took yesterday  night.   PAST MEDICAL HISTORY:  1,  Chronic AFib.  1. Hypertension.  2. Diabetes.  3. CHF.  4. Pulmonary hypertension.   SOCIAL HISTORY:  He lives with his wife.  No smoking for last 30 years.  Last alcohol was 1 beer last week at his son's wedding.  No drugs.   FAMILY HISTORY:  Mother with polycythemia vera and father with diabetes.   ALLERGIES:  No known allergies.   REVIEW OF SYSTEMS:  Please refer to the HPI, otherwise a 14-point review  of system is negative.   PHYSICAL EXAMINATION:  VITAL SIGNS:  Temperature of 102.3, blood  pressure 127/76, pulse is 119, respirations 17, and O2 sat 94-96% on  oxygen.  GENERAL:  The patient is awake and alert.  HEENT:  No icterus or pallor are noted.  No oral lesions appreciated.  NECK:  No JVD appreciated.  LUNGS:  Clear to auscultation with minimal decreased breath sounds at  bilateral bases.  He is in AFib and tachycardic.  NEUROLOGIC:  Grossly nonfocal.  LOWER EXTREMITIES:  Bilateral chronic skin changes with right lower  extremity showing significant swelling, redness, and erythema when  compared to the left, increased temperature on the right side as well.  The patient denies any pain on exam.  There is no skin breakdown.   On EKG, the patient is in AFib with anterior and lateral lead changes  suggestive for ischemia.  This is unchanged from November 2009.  Chest x-  ray shows cardiomegaly with pulmonary edema, which is mild and small  right pleural effusion.   LABORATORIES:  White count of 17.1, hemoglobin 14.6, crit 42.4, and  platelets 153.  INR 1.5.  First set of cardiac enzymes is negative.  Sodium  is low at 130, potassium 4.2, chloride 99, bicarb 23, BUN 12,  creatinine 1.05, glucose 201, and calcium 8.8.   ASSESSMENT AND PLAN:  Afib: Plan is to admit the patient to a tele bed  to monitor his AFib and blood pressure.  We will do serial cardiac  enzymes to rule him out.  We will continue his oral medications  including digoxin, metoprolol, and Cardizem to help control the AFib,  and we will continue the low-dose Cardizem at 5 mg an hour to try and  titrate it off.  If we are unable to titrate the Cardizem after 24 hours  of drip, then to consider Cardiology consult to help better control AFib  with oral medications.  Given the patient low blood pressure  medications, I am hesitant to go up on his current dose of oral  medications.   Fever: We will also do urine analysis cultures and blood cultures to  complete his fever workup.  Plan to repeat the EKG and the chest x-ray  in the morning.  Chest x-ray repeat to make sure that the patient does  not have budding pneumonia.  We will get a CT of the head to rule out  any acute pathology since the patient did complain of slurred speech  this morning.  Also, we will get ultrasound of the right lower extremity  to rule out DVT since it is significantly red and swollen compared to  the left side.  If it turns out to be positive, then we will need a CT  of the chest to rule out PE.  We will restart Coumadin for now.   Meds: We will hold his diabetes medications and put him on sliding scale  instead.  We will hold his cholesterol medication at that time and also  his Lotrel given his low blood pressure.  We will continue his aspirin  for now.  For his antibiotic coverage, we will give him broad coverage  with ceftriaxone and clindamycin for his cellulitis.  This is based on  the last discharge summary.  If there is no response in 48 hours, then  we will need to add vancomycin for MRSA coverage.  Otherwise, the  patient's antibiotics can be  narrowed accordingly when he is ready for  discharge for oral antibiotic coverage.  Joylene Eldra, MD  Electronically Signed     RP/MEDQ  D:  03/29/2009  T:  03/30/2009  Job:  [id number]

## 2011-02-23 NOTE — Assessment & Plan Note (Signed)
[facility]                         ELECTROPHYSIOLOGY OFFICE NOTE   NAME:Bradley [name], Bradley [name]                        MRN:          [id number]  DATE:11/09/2007                            DOB:          03-01-1941    Bradley [name] returns today for follow-up.  He is a very pleasant male with  a history of persistent/chronic atrial fibrillation, hypertension,  diabetes and diastolic heart failure.  Back in the fall he had problems  with a large hemothorax along with a fibrothorax, which ultimately was  decorticated.  He returns today for follow-up.  He has done well since  then.  He denies chest pain or shortness of breath.  He has been back to  exercising and otherwise feels well.  He denies fevers or chills.   MEDICATIONS:  1. Cartia XT 240 mg a day.  2. Cozaar 100 mg a day.  3. Toprol XL 50 mg a day.  4. Metformin 500 twice daily.  5. Aspirin 325 daily.  6. Januvia 100 mg daily.  7. Digoxin 0.25 mg daily.  8. Glyburide 5 mg 2 tablets daily.  9. Flomax 0.4 mg daily.  10.Warfarin 5 mg daily.  11.Lotrel 10/40 mg daily.  12.Furosemide 20 mg twice a day.   PHYSICAL EXAM:  He is a pleasant, well-appearing man in no distress.  Blood pressure was 124/71, the pulse 72 and irregularly irregular,  respirations were 18.  The weight was 231 pounds.  NECK:  No jugular venous distention.  LUNGS:  Clear bilaterally to auscultation.  No wheezes, rales or rhonchi  were present.  CARDIOVASCULAR:  A irregular irregular rhythm with normal S1-S2.  LUNGS:  Clear bilaterally to auscultation.  No wheezes, rales or rhonchi  are present.  There is no increased work of breathing.  EXTREMITIES:  No  cyanosis, clubbing or edema.   IMPRESSION:  1. Chronic atrial fibrillation.  2. Hypertension.  3. Diastolic heart failure.  4. Status post hemothorax decortication.   DISCUSSION:  Overall, Bradley [name] is stable.  He is tolerating his atrial  fibrillation very nicely.  We did talk  today about simplifying his  medical regimen and I offered him the option of  discontinuing his Cartia and increasing his Toprol, but at the present  time he would like to hold  off on this plan.  I will see him back in a year or sooner should he  have any additional cardiac problems.     Doylene Canning. Ladona Ridgel, MD  Electronically Signed    GWT/MedQ  DD: 11/09/2007  DT: 11/10/2007  Job #: [id number]

## 2011-02-23 NOTE — Op Note (Signed)
NAME:  Bradley [name], HARTLINE NO.:  [id number]   MEDICAL RECORD NO.:  [id number]          PATIENT TYPE:  INP   LOCATION:  3306                         FACILITY:  [facility]   PHYSICIAN:  Evelene Croon, M.D.     DATE OF BIRTH:  11/27/1940   DATE OF PROCEDURE:  08/18/2007  DATE OF DISCHARGE:                               OPERATIVE REPORT   PREOPERATIVE DIAGNOSIS:  Large right hydropneumothorax with progressive  dyspnea.   OPERATIVE PROCEDURE:  Right thoracotomy, drainage of large old clotted  hemothorax, decortication of right lower lobe the lung.  Insertion of On-  Q pain pump.   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   ATTENDING SURGEON:  Evelene Croon, M.D.   ASSISTANT:  Coral Ceo, P.A.-C.   ANESTHESIA:  General endotracheal.   CLINICAL HISTORY:  This patient is a 70 year old gentleman who was  referred to me for consideration of the thoracoscopic drainage of a  right pleural effusion.  He presented with a several-month history of  worsening dyspnea and orthopnea.  Chest x-ray showed a large right  pleural effusion, and CT scan subsequently showed a large right pleural  effusion in the base located posteriorly that was concerning for  possible empyema or hemothorax.  He underwent an ultrasound-guided  thoracentesis on May 24, 2007 that withdrew 580 mL of bloody fluid.  This was reportedly negative for malignancy.  He said that his shortness  of breath improved right after the procedure, but he has continued to  have some shortness of breath, and now it is progressing again.  We  repeated a CT scan which showed recurrence of a large right  hydropneumothorax with a thick peel over the right lower lobe suggesting  trapped lung.  I felt the best treatment at this point would be to  perform right thoracotomy for drainage of this collection and  decortication of the lung.  I discussed the operative  procedure with the  patient and his wife including alternatives, benefits and risks  including but not limited to bleeding, blood transfusion, infection,  injury to lung, prolonged air leak, persistent space or prolonged air  leak, and recurrence of the pleural collection.  He understood all this  and agreed to proceed.   OPERATIVE PROCEDURE:  The patient was taken to the operating room and  placed on the table in supine position.  After induction of general  endotracheal anesthesia, a Foley catheter was placed in the bladder  using a sterile technique.  Venous compression stockings were placed on  the legs.  Preoperative intravenous antibiotics were given.  Then, the  patient was positioned in the left lateral decubitus position with the  right side up.  The right chest was prepped with Betadine soap and  solution and draped in usual sterile manner.  A time-out was taken, and  proper patient, proper operative side and proper operation were  confirmed with nursing and anesthesia staff.  Then, the right chest was  opened through a lateral thoracotomy incision.  The pleural space was  entered through approximately the eighth intercostal space.  Upon  entering the pleural space, there was a large collection of old clotted  blood located posteriorly and inferiorly corresponding to the collection  seen on CT scan.  This was completely evacuated.  There was no active  bleeding.  Examination of the pleural space appeared unremarkable.  The  patient did have a fibrothorax with a thick fibrous layer trapping the  right lower lobe of the lung.  The right lower lobe was then  decorticated.  This was a slow process but resulted in good re-expansion  of the right lower lobe.  There were a few air leaks from the lung, and  these were coated with CoSeal.  Then, a 36-French right-angle chest tube  inserted through a separate stab incision and positioned at the right  base.  Then, the On-Q pain pump was  inserted.  The introducer and trocar  were inserted through a stab incision and advanced in a subpleural  location from below the thoracotomy interspace to above the thoracotomy  interspace posterior to the incision.  The introducer was removed, and  through the sheath, the On-Q catheter was inserted.  The sheath was  removed, and the catheter was affixed to the skin.  It was flushed with  5 mL of half percent Marcaine.  It was then connected to the On-Q pump.  There was complete hemostasis at this point.  The ribs were  reapproximated with #2 Vicryl pericostal sutures.  The muscles were  closed in layers with continuous 0 Vicryl suture.  Subcutaneous tissue  was closed with continuous 2-0 Vicryl and the skin with 3-0 Vicryl  subcuticular closure.  The sponge, needle and instrument counts were  correct according to the scrub nurse.  Dry sterile dressings were  applied over the incisions and around the chest tubes which were hooked  to Pleur-Evac suction.  The patient was then turned to the supine  position, extubated and transferred to the post-anesthesia care unit in  satisfactory and stable condition.      Evelene Croon, M.D.  Electronically Signed     BB/MEDQ  D:  08/18/2007  T:  08/19/2007  Job:  [id number]   cc:   Evelene Croon, M.D.  Charlaine Dalton. [name], MD, FCCP

## 2011-02-23 NOTE — Assessment & Plan Note (Signed)
[facility]                            CARDIOLOGY OFFICE NOTE   NAME:Bradley [name], Bradley [name]                          MRN:          [id number]  DATE:03/27/2007                            DOB:          02/26/1941    He was previously followed by Dr. [name].  He will continue following  in our practice with Dr. Lewayne Bunting.   PRIMARY CARE PHYSICIAN:  [name], MD   HISTORY OF PRESENT ILLNESS:  [name] is a 70 year old male patient  previously followed by Dr. [name] with a history of atrial fibrillation  and minimal nonobstructive coronary artery disease by catheterization in  2005, who recently presented to [name] with complaints of dyspnea on  exertion.  This has been present over the last 6 weeks.  He notes some  days are worse than others.  He really describes Class II symptoms some  days and Class III symptoms other days.  He really denies any true  orthopnea.  He denies any paroxysmal nocturnal dyspnea.  He denies any  lower extremity edema.  He denies any cough.  He does note some  increasing abdominal girth.  He denies syncope or near syncope.  He does  note increased heart rates when he exerts himself and develops dyspnea.  He saw [name] recently who set him up for BMET on June 11.  This  revealed elevated glucose at 321, sodium 135, potassium 4.8, BUN 16,  creatinine 0.8.  His hemoglobin was normal at 14.8.  Chest x-ray  revealed cardiomegaly, small amount of effusion and mild pulmonary  vascular congestion.  His electrocardiogram was concerning for new T-  wave inversion laterally in lead I and V5-V6.  This looked to be  somewhat new when compared to prior tracings.  He does admit to  occasional chest tingling on the left.  This may last seconds.  It  sometimes happens with rest and it sometimes happens with exertion.  He  really denies any true weight gain over the last several weeks.  Of  note, he does follow with Dr. Lowell Guitar for nephrology.   Apparently  recently, his Hyzaar was switched to Cozaar.   CURRENT MEDICATIONS:  1. Cartia XT 240 mg daily.  2. Avelox 400 mg daily.  3. Mucinex 600 mg daily.  4. Cozaar 100 mg daily.  5. Digoxin 0.25 mg daily.  6. Glyburide 5 mg, two in the morning and two in the evening.  7. Flomax 0.4 mg daily.  8. Warfarin as directed.  9. Advicor 20/1000 mg daily.  10.Toprol XL 100 mg 1-1/2 tablets daily.  11.Lotrel 10/40 mg daily.  12.Aspirin 81 mg a day.  13.Furosemide 20 mg b.i.d.  14.Viagra p.r.n.   SOCIAL HISTORY:  He is an ex-smoker.   FAMILY HISTORY:  Significant for premature coronary artery disease.   REVIEW OF SYSTEMS:  Please see HPI.  Denies any fevers, chills, melena,  hematochezia, hematuria or dysuria.  The rest of the review of systems  are negative.   PHYSICAL EXAMINATION:  GENERAL:  Well-developed, well-nourished.  VITAL SIGNS:  Blood  pressure 125/73, pulse 70, weight 243 pounds.  HEENT:  Normal.  NECK:  Without JVD.  CARDIAC:  Normal S1, S2.  Irregularly, irregular rhythm.  No murmurs.  LUNGS:  Decreased breath sounds bilaterally.  No wheezing, rales or  rhonchi.  ABDOMEN:  Distended.  Normoactive bowel sounds.  Nontender.  No  organomegaly.  EXTREMITIES:  1+ edema bilaterally with chronic stasis changes.  SKIN:  Warm and dry.  NEUROLOGIC:  He is alert and oriented x3.  Cranial nerves 2-12 grossly  intact.   Electrocardiogram today does reveal atrial fibrillation with heart rate  of 62.  He does have T-wave inversions in I and aVL as well as II, III  and aVF and V4-V6.  When today's tracing is compared to prior tracings  dated December 01, 2006, April 21, 2006, and October 28, 2005, there are  really no significant changes.   IMPRESSION:  1. Dyspnea.  2. Atypical chest pain.  3. Probable acute on chronic diastolic congestive heart failure.  4. Minimal nonobstructive coronary artery disease by catheterization      in 2005.  Left anterior descending distal  30-40%, circumflex 30%,      right coronary artery minimal luminal irregularities.  5. Preserved left ventricular function with an ejection fraction of      50% by echocardiogram in 2005.  6. Mild pulmonary hypertension by previous echocardiogram.  7. Permanent atrial fibrillation.      a.     Failed direct current cardioversion while on amiodarone in       the past.      b.     Rate controlled therapy.      c.     Coumadin therapy.  8. Hypertension.  9. Diabetes mellitus.  10.Hyperlipidemia.  11.Benign prostatic hypertrophy.  12.Obesity.   PLAN:  The patient presents to the office today for further evaluation  of dyspnea on exertion.  He has also had some atypical chest pain.  He  had nonobstructive disease by cardiac catheterization in 2005.  His EKG,  when compared to previous tracings that we have done in our office over  the years has really not changed recently.  He does have evidence of  congestive heart failure by recent chest x-ray.  Will contact the lab in  [place] to see if we can add a BNP on to what he had done recently.  Otherwise, will go up on his Lasix to 40 mg twice a day and repeat a  BMET and BNP in 1 week's time.  Will also set him up for an adenosine  Myoview study to rule out the possibility of ischemia as a cause for his  symptoms.  Will also set him up for an echocardiogram to reassess his LV  function and right heart pressures.  The patient will be set up for  followup with Dr. Ladona Ridgel over the next 1-2 weeks.  I discussed the above  assessment and plan today with Dr. Samule Ohm, who agreed.      Bradley Newcomer, PA-C  Electronically Signed      Salvadore Farber, MD  Electronically Signed   SW/MedQ  DD: 03/27/2007  DT: 03/28/2007  Job #: [id number]   cc:   [name], MD

## 2011-02-23 NOTE — H&P (Signed)
NAME:  Bradley [name], Bradley [name] NO.:  [id number]   MEDICAL RECORD NO.:  [id number]          PATIENT TYPE:  INP   LOCATION:  1227                         FACILITY:  [facility]   PHYSICIAN:  Lucita Ferrara, MD         DATE OF BIRTH:  Jul 15, 1941   DATE OF ADMISSION:  09/09/2008  DATE OF DISCHARGE:                              HISTORY & PHYSICAL   PRIMARY CARE PHYSICIAN:  [facility].   [name] is a 70 year old male with persistent chronic atrial  fibrillation, history of diastolic heart failure, history of large  hemothorax, along with a fibrothorax.  He presented to the [facility] with worsening shortness of breath.  The  patient was intermittently put on BiPAP upon presentation, and  apparently the patient's symptoms improved.   The patient states that he has had orthopnea; he usually sleeps on 1-2  pillows.  Symptoms have been getting progressively worse.  The patient  states that he cannot sleep as he anxious.  The patient denies cough,  denies sick contacts; denies urinary frequency, urgency or burning.   PAST MEDICAL HISTORY:  Significant for as above.  1. A chronic atrial fibrillation.  2. Hypertension.  3. Diabetes.  4. Diastolic heart failure.  5. Status post large pneumothorax with fibrothorax.  6. Status post declaudication.  7. Status post cardiac catheterization May 09, 2008; which showed no      evidence of high-grade coronary obstruction, moderate pulmonary      hypertension, questionable diastolic dysfunction.   MEDICATIONS:  1. Glyburide 5 mg four tablets daily.  2. Digoxin 0.25 mg daily.  3. Metoprolol 25 mg p.o. b.i.d.  4. Lotrel 10/40 p.o. daily.  5. Coumadin for goal INR between 2.0 and 3.0.  6. Flomax 0.4 mg daily.  7. Cartia XT 240 mg daily.  8. Lasix 20 mg two tablets p.o. daily.  9. Advicor 1000 mg/20 mg once daily.  10.Cozaar 100 mg daily.  11.Aspirin 325 mg p.o. daily.  12.Multivitamin one tablet p.o.  daily.  13.Januvia 100 mg p.o. daily.  14.Mucinex 600 mg b.i.d.  15.Glucophage 500 mg b.i.d.  16.Ambien 10 mg p.o. daily.   ALLERGIES:  NO KNOWN DRUG ALLERGIES.   PHYSICAL EXAMINATION:  GENERAL:  Generally speaking the patient is in no  acute distress.  He is a pleasant 70 year old male.  VITAL SIGNS: Blood pressure 164/112, pulse 28, respirations 43, pulse  oximetry 81% on 4 liters nasal cannula.  HEENT:  Normocephalic and atraumatic.  Sclerae is anicteric.  PERRLA.  Extraocular muscles intact.  NECK:  Supple.  No JVD or carotid bruits.  LUNGS:  Clear to auscultation bilaterally.  No rhonchi, rales or  wheezes.  ABDOMEN:  Obese, soft, nontender.  Positive bowel sounds.  CARDIOVASCULAR: Regular irregular; tachy with a rapid ventricular  response.  EXTREMITIES:  No clubbing, cyanosis or edema.  NEUROLOGIC:  Patient is anxious yet alert, oriented x3.  Cranial nerves  II-XII grossly intact.   EKG:  Shows nonspecific ST/T-wave changes.   LABORATORY DATA:  The patient's beta natriuretic peptide is  102.  Digoxin level low at 0.5.  Basic Metabolic Panel:  Glucose 162, BUN 14,  creatinine 0.94.  Cardiac Markers:  First set negative.  INR is 2.5.  CBC:  White count 15.3, hemoglobin 15.6, hematocrit 46.7, platelet count  231.  Urinalysis:  Negative leukocytes and negative nitrites.   ASSESSMENT/PLAN:  The patient is a 70 year old with:  1. 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  2. Rapid ventricular atrial fibrillation with a history of chronic      atrial fibrillation:  Currently on digoxin, with low levels.  On      Cartia and on Coumadin with INR therapeutic.  3. History of diastolic congestive heart failure.  4. Diabetes type 2.  5. Hypertension.  6. History of tobacco abuse.  7. Benign prostatic hypertrophy.   PLAN:   Will go ahead and admit the patient to the step-down versus  medical telemetry unit.  Will initiate antibiotics for hospital-acquired  organisms with vancomycin and Zosyn.  Blood cultures.  Urine culture.  Will digitalize the patient to increase digoxin levels.  Will use  Lopressor p.r.n..  If unsuccessful, will proceed with cardiology  consultation and/or Cardizem drip.  The patient may at some point  benefit from amiodarone, although his chronic lung disease may be a  contraindication.  For diabetes, will continue glyburide, Januvia.  For  the BPH will continue Flomax.  The rest of the plan is dependent on his  progress and consultant recommendations.      Lucita Ferrara, MD  Electronically Signed     RR/MEDQ  D:  09/09/2008  T:  09/09/2008  Job:  [id number]

## 2011-02-23 NOTE — Discharge Summary (Signed)
NAME:  Bradley [name], Bradley [name] NO.:  [id number]   MEDICAL RECORD NO.:  [id number]          PATIENT TYPE:  INP   LOCATION:  4740                         FACILITY:  [facility]   PHYSICIAN:  Rosalyn Gess. Norins, MD  DATE OF BIRTH:  10-27-1940   DATE OF ADMISSION:  03/29/2009  DATE OF DISCHARGE:  04/02/2009                               DISCHARGE SUMMARY   ADMITTING DIAGNOSES:  1. Cellulitis of his distal right lower extremity.  2. Chronic atrial fibrillation.  3. History of congestive heart failure.  4. Diabetes.  5. History of pulmonary hypertension.   DISCHARGE DIAGNOSES:  1. Cellulitis of his distal right lower extremity.  2. Chronic atrial fibrillation.  3. History of congestive heart failure.  4. Diabetes.  5. History of pulmonary hypertension.   CONSULTANTS:  None.   PROCEDURES:  1. Chest x-ray on day of admission showed cardiomegaly and      interstitial pulmonary edema.  Right pleural effusions.  2. CT of the head without contrast on June 19 day of admission, which      showed negative for acute hemorrhage.  Age-indeterminate lacunar      infarct involving anterior limb of the left internal capsule.      Patchy areas, low-density white matter, which may represent chronic      small-vessel ischemic change.  Left maxillary sinus disease, acute      sinusitis cannot be excluded.  3. A 2-view chest x-ray on June 20, which showed no interval change.   HISTORY OF PRESENT ILLNESS:  The patient is a 70 year old gentleman,  followed for mild CHF, hypertension, diabetes, and chronic atrial fib,  who presents with confusion, disorientation, and generalized weakness  beginning the morning of admission.  The patient's wife was historian.  The patient was doing well the day prior to admission.  On the morning  of admission, when he awoke, he was unable to walk to the bathroom due  to generalized weakness.  He was confused at that time.  He was hot to  the touch.  He was  brought to the emergency department.  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  According to the patient, his right lower extremity be getting  increasingly erythematous for the 2 weeks prior to admission, but hot to  the touch for several days prior to admission.  The patient was admitted  to hospital for IV antibiotics for treatment of cellulitis.   Please see the H and P for past medical history, family history, and  social history.  Also, see previous hospital records including discharge  summary.   HOSPITAL COURSE:  1. Cellulitis.  The patient was started on IV antibiotics using Ancef      and clindamycin.  This was continued through the June 22.  On the      day of discharge, the patient had been afebrile for greater than 48      hours.  His right distal lower extremity, although it remained  dark      and erythematous, had much less heat to it and much less      tenderness.  At this point, the patient is doing well and able to      be discharged home on oral antibiotics with cefazolin and      continuing with his clindamycin for an additional 10 days.  He will      follow up with his primary care physician, Dr. [name] in regards      to need for any further vascular workup including possible      consultation with veinologist.  2. Congestive heart failure.  The patient is well compensated.  Three      chest x-rays without significant pulmonary edema.  The patient had      no respiratory symptoms.  3. Diabetes.  The patient is well controlled.  CBGs have been stable.  4. Atrial fibrillation.  The patient has chronic atrial fibrillation,      and has failed DC cardioversion.  He is followed by Dr. Viann Fish.  While on telemetry, the patient had several episodes of      short runs of V-TACH that were asymptomatic.  The patient denies      any further  cardiology workup as an inpatient and will defer this      to Dr. Donnie Aho as an outpatient in regards to any additional needs.  5. Pulmonary: The patient remained stable with no cough, no shortness      of breath, and no wheezing.   DISCHARGE EXAMINATION:  VITAL SIGNS:  Temperature is 97.3, blood  pressure 148/81, heart rate 73, and respirations 20.  CBG 145.  GENERAL APPEARANCE:  This is a heavy-set Caucasian male, who is in no  acute distress.  HEENT:  Unremarkable.  CHEST:  The patient is moving air well, no increased work of breathing.  No audible wheezing.  CARDIOVASCULAR:  The patient's precordium is quiet.  His heart rate is  irregularly irregular.  ABDOMEN:  Obese.  Without tenderness.  GENITALIA AND RECTAL:  Deferred.  EXTREMITIES:  The patient has normal upper extremities.  Right lower  extremity with no edema and no deformities.  DERM:  The patient has deep rubor to the distal right lower extremity  with mild calor.  There is no significant edema.  No tenderness.   FINAL LABORATORY:  INR from June 23 was 2.7 at therapeutic.  Cardiac  panels were negative x4.  Urine culture was no growth at 5 days.  Urine  calcium was 2.  Blood cultures x2 were negative at 5 days.  Final BMP on  June 21 with a sodium 138, potassium 3.5, chloride 104, CO2 of 24, BUN  11, creatinine 0.8, and glucose 134.  Final CBC from June 21 with a  white count of 7900 with a normal differential.  Hemoglobin 13.1 g and  hematocrit 37.9%.  Magnesium was checked and normal at 2.1.  Hemoglobin  A1c from June 19 was below goal at 6.8%.  TSH was normal at 0.67.  D-  dimer at admission was 0.23.   DISCHARGE MEDICATIONS:  The patient will resume his home medications  including;  1. Cartia XT 240 daily.  2. Furosemide 20 mg daily.  3. Flomax 0.4 mg daily.  4. Digoxin 0.25 mg daily.  5. Advicor 1000/20 daily.  6. Aspirin daily.  7. Lotrel 10/40 once daily.  8. Januvia 100 mg daily.  9.  Coumadin 5 mg daily  and monitored by [name].  10.Glyburide 5 mg daily.  11.Metformin 500 mg b.i.d.  12.Metoprolol 25 mg daily.   ADDITIONAL MEDICATIONS:  1. Cephalexin 500 mg q.i.d.  2. Clindamycin 300 mg tabs 2 tabs b.i.d. both for 10 days.   DISPOSITION:  The patient is discharged home.  He will follow up with  Dr. [name] in 7-10 days.   The patient's condition at the time of discharge dictation is improved  and stable.      Rosalyn Gess Norins, MD  Electronically Signed     MEN/MEDQ  D:  04/02/2009  T:  04/02/2009  Job:  [id number]   cc:   Georga Hacking, M.D.

## 2011-02-23 NOTE — Assessment & Plan Note (Signed)
OFFICE VISIT   [name], [name]  DOB:  [birth year]                                        August 10, 2007  CHART #:  [id number]   [name] returned today for a follow up of right plural effusion. I  last saw him on June 20, 2007 and a CT scan at that time showed a  small loculated right hydropneumothorax at the base posteriorly after  recent right thoracentesis. There appeared to be a thickened peel of the  right lower lobe with some atelectasis and evidence of a trapped lung.  He said that since then, he has developed progressive worsening of his  shortness of breath. He now gets short of breath with light activity and  said that it is significantly limiting him. He has had no fever or  chills. He denies any significant cough.   PHYSICAL EXAMINATION:  VITAL SIGNS:  Blood pressure 123/75, pulse 76 and  regular. Respiratory rate is 28 and unlabored. Oxygen saturation is 90%  on room air.  GENERAL:  He looks well.  CARDIAC:  Regular rate and rhythm with normal heart sounds.  LUNGS:  Decreased breath sounds over the left lower lobe.   A follow chest x-ray shows increased opacity at the right lower  hemithorax. Radiologists were considering possible right lower lobe  infiltrate or increased effusion. This is a significant change from his  previous x-ray.   IMPRESSION:  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 I have  recommended proceeding with a repeat CT scan to evaluate this area and  if the opacity is all due to plural effusion, then I would recommend  proceeding with a limited thoracotomy for drainage of this effusion and  decortication of the lung. I think that the CT scan is necessary to  assure that this is plural effusion and not an infiltrative process in  the right lower lobe. We will  schedule this in the next couple of days.  I will check the results and call him to let him know whether we should  proceed with surgery or not. He does have a history  of diastolic heart failure and has been followed by Dr. Lewayne Bunting.  His last catheterization was in July 2008 and he had no significant  coronary stenoses at that time.   Evelene Croon, M.D.  Electronically Signed   BB/MEDQ  D:  08/10/2007  T:  08/11/2007  Job:  [id number]

## 2011-02-25 ENCOUNTER — Ambulatory Visit (INDEPENDENT_AMBULATORY_CARE_PROVIDER_SITE_OTHER): Payer: Medicare Other | Admitting: *Deleted

## 2011-02-25 DIAGNOSIS — I4891 Unspecified atrial fibrillation: Secondary | ICD-10-CM

## 2011-02-25 DIAGNOSIS — Z7901 Long term (current) use of anticoagulants: Secondary | ICD-10-CM

## 2011-02-25 LAB — POCT INR: INR: 2

## 2011-02-25 MED ORDER — WARFARIN SODIUM 5 MG PO TABS: 5.0000 mg | ORAL_TABLET | ORAL | Status: DC

## 2011-02-26 NOTE — H&P (Signed)
NAME:  Bradley [name], Bradley [name] NO.:  [id number]   MEDICAL RECORD NO.:  [id number]          PATIENT TYPE:  INP   LOCATION:  1845                         FACILITY:  [facility]   PHYSICIAN:  Doylene Canning. Ladona Ridgel, MD    DATE OF BIRTH:  [birth year]   DATE OF ADMISSION:  10/23/2006  DATE OF DISCHARGE:                              HISTORY & PHYSICAL   ADMITTING DIAGNOSES:  1. Congestive heart failure with pulmonary edema.  2. Atrial fibrillation with a rapid ventricular response resulting in      #1.  3. He has chronic bronchitis.  4. Hypertension.  5. Diabetes   HISTORY OF PRESENT ILLNESS:  The patient is a 70 year old male with a  history of chronic bronchitis as well as atrial fibrillation, obesity,  diabetes and hypertension who noted increasing dyspnea and abdominal  fullness several days ago.  Yesterday, his dyspnea worsened.  Today his  shortness of breath became severe and was associated with substernal  chest pressure and abdominal distension.  He presented to the emergency  rooms where he was in atrial fibrillation with rapid ventricular  response.  Chest x-ray demonstrated bilateral diffuse pulmonary  infiltrates.  He is admitted for additional evaluation.  The patient  denies fevers or chills or any worsening of his chronic bronchitis.  He  denies abdominal pain but does have abdominal fullness.  He denies any  new arm pain or neck or jaw pain.  His past medical history is notable  for hypertension, diabetes, BPH, paroxysmal/persistence/chronic atrial  fibrillation, dyslipidemia and diabetes.  His family history is notable  for a father who had both diabetes and Alzheimer's dementia.   SOCIAL HISTORY:  The patient has a remote tobacco use history but has  not smoked for over 20 years.  He denies alcohol abuse.  He lives at  home.  His review of systems is as noted in the HPI.  Otherwise all  systems reviewed and found to be negative.   PHYSICAL EXAMINATION:  He is a  pleasant obese middle-aged man in mild  respiratory distress.  The blood pressure was 149/84, the pulse was 130  and irregularly irregular, respirations 28.  HEENT is normocephalic,  atraumatic.  Pupils are equal and round.  Oropharynx moist.  Sclerae  anicteric.  The neck revealed 8-9 cm jugular distension.  There is no  thyromegaly.  Trachea is midline.  The carotids are 2+ and symmetric.  The lungs revealed rales all the way up and occasional scattered wheeze.  There is mild increased work of breathing.  Cardiac exam reveals an  irregularly irregular tachycardia with normal S1-S2.  The heart sounds  were somewhat distant, however.  The PMI could not be appreciated.  The  abdominal exam is obese, nontender, nondistended.  There is no  organomegaly.  Bowel sounds are present.  There is no rebound or  guarding.  Extremities demonstrated trace peripheral edema bilaterally.  There is no cyanosis or clubbing noted.  There were quite chronic venous  stasis coloration changes in his lower extremities.  The neurologic exam  was alert and oriented  x3 with cranial nerves intact.  Strength is 5/5  and symmetric.   EKG demonstrates atrial fibrillation with a rapid ventricular response.  Chest x-ray demonstrates bilateral diffuse pulmonary infiltrates  consistent with congestive heart failure.   IMPRESSION:  1. Congestive heart failure likely acute on chronic diastolic      dysfunction.  2. Atrial fibrillation with rapid ventricular response.  3. Obesity.  4. Longstanding bronchitis.  5. Diabetes.  6. Hypertension.  7. Chronic Coumadin treatment.   RECOMMENDATIONS:  The patient will be admitted to hospital and will be  given intravenous Lasix.  We will obtain serial cardiac enzymes.  We  will give intravenous digoxin as well as calcium channel blockers  plus/minus beta blockers for ventricular rate control.  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  Next, we will plan on  continuing his Coumadin.  We will get bronchodilator therapy with  Xopenex and will obtain serial cardiac enzymes and if these are positive  proceed with catheterization.      Doylene Canning. Ladona Ridgel, MD  Electronically Signed     GWT/MEDQ  D:  10/23/2006  T:  10/24/2006  Job:  [id number]

## 2011-02-26 NOTE — Cardiovascular Report (Signed)
NAME:  Bradley [name], Bradley [name] NO.:  [id number]   MEDICAL RECORD NO.:  [id number]                   PATIENT TYPE:  OIB   LOCATION:  2852                                 FACILITY:  [facility]   PHYSICIAN:  Arturo Morton. Riley Kill, M.D.             DATE OF BIRTH:  01/27/1941   DATE OF PROCEDURE:  12/18/2003  DATE OF DISCHARGE:  12/18/2003                              CARDIAC CATHETERIZATION   INDICATIONS:  This is a very pleasant 70 year old gentleman who presents  with an abnormal Cardiolite suggesting mild apical ischemia and global  hypokinesis with EF calculated at 46% by adenosine Cardiolite.  A 2-D echo  suggested grossly normal LV function with moderate concentric LVH and no  definite valvular abnormalities with mild pulmonary hypertension.  The  patient describes catheterization in [place] approximately 25 years ago and  he was told he had a blockage.  He was apparently treated medically.  He has  recently been noted to be in atrial fibrillation and was started on  Coumadin.   Because of the abnormal Cardiolite, the case was reviewed with Dr. [name]  and recommendation made for cardiac catheterization.   PROCEDURES:  1. Left heart catheterization.  2. Selective coronary arteriography.  3. Selective left ventriculography.  4. Distal aortography.   DESCRIPTION OF PROCEDURE:  The procedure was performed from the right  femoral artery using 6 French catheters.  He tolerated the procedure well  and there were no complications.  He was taken to the holding area in  satisfactory clinical condition.   HEMODYNAMIC DATA:  1. Central aorta:  140/101, mean 119.  2. Left ventricle:  135/21.  3. No gradient on pullback across the aortic valve.   ANGIOGRAPHIC DATA:  1. Ventriculography was performed in the RAO projection.  Overall systolic     function was mildly reduced, but likely related to the patient's     moderately rapid heart rate.  There was no definite  mitral regurgitation.  2. The left main was short and consisted of common funnel leading into an     LAD and circumflex.  3. The left anterior descending coursed to the apex.  There is mild luminal     irregularity distally and mild irregularity of the distal portion or the     diagonal with about 30-40% narrowing there.  No high grade areas of     obstruction were noted.  4. There is a small, tiny ramus intermedius without significant narrowing.  5. The circumflex provides a co-dominant system.  There is mild luminal     irregularity in the ramus intermedius, but without high grade     obstruction.  There is perhaps 30% narrowing in the AV circumflex.  There     is a co-dominant system.  6. The right coronary artery provides a single PDA with some tortuosity.     There is minimal luminal irregularity,  but no significant areas of focal     stenosis.   CONCLUSION:  1. No high grade areas of obstruction.  2. Slight reduction in overall left ventricular function.  Probably related     to the patient's atrial fibrillation.   RECOMMENDATIONS:  1. Good rate control.  2. Coumadin anticoagulation.  3. Continued followup with Dr. Glennon Hamilton.                                               Arturo Morton. Riley Kill, M.D.    TDS/MEDQ  D:  12/18/2003  T:  12/19/2003  Job:  [id number]   cc:   [name], MD [facility]  [phone]  [place], [place] [address]   Cecil Cranker, M.D.

## 2011-02-26 NOTE — Assessment & Plan Note (Signed)
[facility]                              CARDIOLOGY OFFICE NOTE   NAME:Kasinger, AUDRA BELLARD                    MRN:          [id number]  DATE:04/21/2006                            DOB:          [birth year]    The patient is a very pleasant 70 year old obese white married male with  chronic atrial fibrillation, having failed DC cardioversion in the past.  He  also has hypertension, diabetes, BPH, hyperlipidemia, mildly reduced EF, no  significant coronary disease.  The patient is asymptomatic.  Unfortunately,  he has gained 31 pounds over the past 20 months.   MEDICATIONS:  1.  Digoxin 0.25 mg daily.  2.  Flomax 0.04 daily.  3.  Glyburide 10 mg q.a.m. and 10 mg p.m.  4.  Coumadin.  5.  Advicor __________ mg.  6.  Toprol-XL 150 mg.  7.  Lotrel 10/40.  8.  Hyzaar 100/25 mg.  9.  Aspirin 81 mg.   PHYSICAL EXAMINATION:  VITAL SIGNS:  Weight is 251.  Blood pressure 126/78,  pulse 77, atrial fibrillation.  GENERAL APPEARANCE:  Normal.  NECK:  JVP is not elevated.  Carotid pulse is barely palpable.  LUNGS:  Clear.  CARDIAC:  Exam normal.  ABDOMEN:  Obese.  EXTREMITIES:  No edema.   EKG:  Atrial fibrillation with ventricular rate of 94, no significant ST-T  abnormality.   ASSESSMENT AND PLAN:  The patient is on 2 ACE inhibitors and I will review  this with [name].  I strongly recommended [employer].  I also suggested  reducing the aspirin to 81 mg, since he is on Coumadin.  We will plan to see  him back in 6 months or p.r.n.   CORRECTION  The patient is actually on an ARB Hyzaar and an ACE Lotrel.  Will plan to  leave him on this dosing since his blood pressure is quite stable, although  I do not think there is good evidence of the additive effect.                             Cecil Cranker, MD, [facility]    EJL/MedQ  DD:  04/21/2006  DT:  04/21/2006  Job #:  [id number]

## 2011-02-26 NOTE — Discharge Summary (Signed)
NAME:  Bradley [name], Bradley [name] NO.:  [id number]   MEDICAL RECORD NO.:  [id number]          PATIENT TYPE:  INP   LOCATION:  4731                         FACILITY:  [facility]   PHYSICIAN:  Thomos Lemons, D.O. [facility]   DATE OF BIRTH:  [birth year]   DATE OF ADMISSION:  07/22/2004  DATE OF DISCHARGE:  07/23/2004                                 DISCHARGE SUMMARY   DISCHARGE DIAGNOSES:  1.  Dyspnea.  2.  Bronchitis.  3.  Atrial fibrillation-flutter.   BRIEF ADMISSION HISTORY:  Bradley [name] is a 70 year old male who was seen in  the office on July 20, 2004 with dyspnea.  Chest x-ray was consistent  with bilateral infiltrative edema versus infection.  Blood cultures were  obtained.  The patient was started on albuterol and Avelox.  The patient  presented on July 22, 2004, stating he was not feeling any better.   PAST MEDICAL HISTORY:  1.  Hypertension.  2.  Adult-onset diabetes mellitus.  3.  Paroxysmal atrial fibrillation.  4.  Chronic anticoagulation.  5.  BPH.  6.  Status post cardiac cath with no significant coronary disease and a      mildly reduced EF.   HOSPITAL COURSE:  PROBLEM #1 - PULMONARY:  The patient presented with  complaints of dyspnea.  His respiratory insufficiency appeared to be  secondary to bronchitis.  Chest x-ray was without signs of congestive heart  failure; BNP was also negative.  There were also no signs of pneumonia on  chest x-ray and he was afebrile with a normal white count.  There were also  no signs of COPD.  The patient is also not hypoxic.  The patient is  ambulating in the halls, maintaining saturations greater than 93% on room  air.  The patient of note likely does have some underlying bronchitis and  will benefit from his continued dose of Avelox as well as an albuterol MDI  at discharge, guaifenesin and possible a steroid Dosepak.   PROBLEM #2 - HYPERTENSION:  This is stable.   PROBLEM #3 - ADULT-ONSET DIABETES MELLITUS:  Also stable.   PROBLEM #4 - CARDIOVASCULAR:  The patient has a history of paroxysmal atrial  fibrillation and is chronically anticoagulated.  His INR was 2 on admission.  The patient did have some fib-flutter during this admission with a variable  rate ranging between the 80s to the one-teens.  The patient's increased  ventricular response was associated with increased activity and probably  secondary to the underlying pulmonary process.  The patient is asymptomatic.  As noted, the patient showed no signs of heart failure on chest x-ray and  BNP was normal.  Serial cardiac enzymes have been negative.   LABORATORIES AT DISCHARGE:  Chest x-ray on July 22, 2004 showed  cardiomegaly with vascular congestion and mild bronchitic changes, no  congestive heart failure, no pneumonia, no COPD.   CBC was normal.  BMET was normal except for a glucose over 200.  Pro time on  discharge was 18.8, INR 1.9, digoxin level of 0.4.  BNP was 65.8.   MEDICATIONS  AT DISCHARGE:  He is to resume his home medications including:  1.  Advicor 1000/20 mg daily.  2.  Flomax 0.4 mg daily.  3.  Glyburide 5 mg 2 tabs in the morning, 1 at night.  4.  Digoxin 0.25 mg nightly.  5.  Hyzaar 50 mg daily.  6.  Lotrel 5/20 mg daily.  7.  Toprol-XL 150 mg daily.  8.  Coumadin 5 mg daily.  9.  Avelox 400 mg daily -- last dose is July 29, 2004.  10. Albuterol MDI 2 puffs 4 times daily.  11. Guaifenesin 600 mg 2 tabs b.i.d.   FOLLOWUP:  Follow up with Dr. Nolon Rod. [name] next week and for a Coumadin check  next week as well.       LC/MEDQ  D:  07/23/2004  T:  07/23/2004  Job:  [id number]

## 2011-02-26 NOTE — Assessment & Plan Note (Signed)
[facility]                              CARDIOLOGY OFFICE NOTE   NAME:Leffel, URBANO MILHOUSE                    MRN:          [id number]  DATE:04/21/2006                            DOB:          06-Jun-1941    CORRECTION   The patient is actually on an ARB Hyzaar and an ACE Lotrel.  Will plan to  leave him on this dosing since his blood pressure is quite stable, although  I do not think there is good evidence of the additive effect.                              Cecil Cranker, MD, [facility]    EJL/MedQ  DD:  04/21/2006  DT:  04/21/2006  Job #:  [id number]

## 2011-02-26 NOTE — Assessment & Plan Note (Signed)
[facility]                         ELECTROPHYSIOLOGY OFFICE NOTE   NAME:[name], [name]                        MRN:          [id number]  DATE:12/01/2006                            DOB:          1941-06-30    Bradley [name] returns today for followup.  He is a very pleasant man with a  history of atrial fibrillation and obesity and hypertension and diabetes  who was admitted to the hospital back in January with acute on chronic  diastolic heart failure and atrial fibrillation with a rapid ventricular  response.  He was treated with AV nodal blocking drugs and improved.  The patient had been tried in the past to maintain sinus rhythm but  could not.  Since discharge from the hospital he has been stable.  He  has had no chest pain and no shortness of breath.  He continues to be  active and has gone back to the gym and is exercising on a regular  basis, typically on the treadmill, the exercise bike and exercising with  light weights.  His medications include cardia 240 a day, Avelox,  digoxin 0.25 daily, glyburide, Flomax, warfarin, Advicor, Toprol-XL 100  mg 1-1/2 tablets daily, and Lotrel.  He is also on Hyzaar.  He is on  furosemide 20 twice a day.   EXAM:  Today, he is a pleasant middle-aged man in no distress.  Blood  pressure was 125/75, the pulse was 80 and irregular, respirations were  18, weight was 249 pounds.  NECK:  Revealed no jugular venous distention.  LUNGS:  Clear bilaterally to auscultation.  No wheezes, rales or  rhonchi.  CARDIOVASCULAR EXAM:  Regular irregular rate and rhythm with normal S1  and S2.  EXTREMITIES:  Demonstrated no cyanosis, clubbing or edema.   EKG demonstrates afib with a controlled ventricular response.   IMPRESSION:  1. Atrial fibrillation now with a controlled ventricular response.  2. Diastolic heart failure.  3. Hypertension.  4. Obesity.   DISCUSSION:  I have asked that Bradley [name] continue his present  medical  regimen.  I have asked that he continue to exercise on a regular basis  and I have asked that he push back from the table and try to lose some  weight for [place].  I will see him back in 6 months.  He will continue his  Coumadin.     Doylene Canning. Ladona Ridgel, MD  Electronically Signed    GWT/MedQ  DD: 12/01/2006  DT: 12/01/2006  Job #: [id number]

## 2011-02-26 NOTE — H&P (Signed)
NAME:  JWAN, HORNBAKER NO.:  [id number]   MEDICAL RECORD NO.:  [id number]          PATIENT TYPE:  INP   LOCATION:  4731                         FACILITY:  [facility]   PHYSICIAN:  [name], MD [facility]    DATE OF BIRTH:  [birth year]   DATE OF ADMISSION:  07/22/2004  DATE OF DISCHARGE:                                HISTORY & PHYSICAL   CHIEF COMPLAINT:  Shortness of breath.   HISTORY OF PRESENT ILLNESS:  Mr. Bowsher is a 70 year old gentleman who  presented to the office two days ago with two-week history of shortness of  breath, wheezing, chest tightness, productive cough with green to clear  sputum.  At that time he was noticed to be afebrile.  He had audible  wheezing but there was no obvious sign of respiratory distress.  His O2  saturation on room air was 90% and after a treatment with albuterol, it came  up to 94%.  A chest x-ray showed what seemed to me as bilateral infiltrates  consistent with either edema or infection.  Since the patient's history fit  better with infection, he was started on Avelox after two blood cultures  were done.  He also was started on albuterol.  He came today for follow-up.  He is not doing any better.  He continues with shortness of breath and chest  tightness and chest pain with cough only.  Albuterol did help with wheezing.  At this time I made the decision to admit him to the hospital for further  treatment and evaluation.   PAST MEDICAL HISTORY:  1.  Hypertension.  2.  Diabetes.  3.  Benign prostatic hypertrophy or BPH.  4.  History of paroxysmal atrial fibrillation.  He was started on Coumadin      in February 2005.  5.  Recent workup included an abnormal Cardiolite, which was followed up by      a cardiac catheterization, which showed a mildly reduced systolic      function of the left ventricle and no high-grade areas of coronary      obstruction.   FAMILY HISTORY:  Father had Alzheimer's and diabetes.  No history of  colon,  breast, or prostate cancer in the family.   SOCIAL HISTORY:  The patient has not smoked since he was 70 years old, and  he does not drink.   REVIEW OF SYSTEMS:  He denies any fever, chills, sinus congestion, or lower  extremity edema.  Again, he only has chest pain with cough.  No nausea,  vomiting, diarrhea, or abdominal pain.   MEDICATIONS:  1.  Digoxin 0.25 mg one p.o. daily.  2.  Flomax 0.4 mg one a day.  3.  Advicor 1000/20 mg one a day.  4.  Glyburide 5 mg two in the morning and one at night.  5.  Lotrel 5/20 mg one p.o. daily.  6.  Toprol XL 150 one p.o. daily.  7.  Hyzaar 50 mg one p.o. daily.  8.  Coumadin.   ALLERGIES:  No known drug allergies.   PHYSICAL EXAMINATION:  GENERAL:  Today the patient is alert and oriented, in  mild respiratory distress.  VITAL SIGNS:  O2 saturation on room air was 92%.  He was afebrile.  Pulse  was noticed to be irregular and ranging between 90-110 beats a minute.  Respiratory rate 22.  Blood pressure 110/70.  NECK:  There was no JVD at 45 degrees.  LUNGS:  I hear few fine crackles at the bases, bilateral wheezing.  EXTREMITIES:  No edema.  CARDIOVASCULAR:  Irregular rate and rhythm.  ABDOMEN:  Quite obese but soft and nontender and no obvious organomegaly.  NEUROLOGIC:  Speech, gait, and motor are intact.   ASSESSMENT AND PLAN:  1.  Mr. Stroot has been admitted to the hospital with shortness of breath,      cough with green sputum, and wheezing.  During the initial evaluation      two days ago, I felt that these symptoms were rather related to either      bronchitis or early pneumonia but after seeing the chest x-ray report      that described more vascular congestion rather than pneumonia, I am not      that sure.  Since he is not better, I made the decision to admit him to      the hospital for further treatment.  My plan is to repeat the chest x-      ray, check cardiac enzymes, and BNP to see about the possibility of       congestive heart failure.  I will continue with intravenous antibiotics      and will also diurese him.  We may consider a cardiology consult.  The      EKG today at the office showed atrial fibrillation and some ST changes      in lateral leads.  Most of these changes have been seen before.  2.  Will continue with his hypertensive medicines.  3.  Will continue with glyburide even though it is going to be a lower dose      and cover him with sliding scale.  4.  Will check an INR and continue with Coumadin.       JEP/MEDQ  D:  07/22/2004  T:  07/22/2004  Job:  [id number]

## 2011-02-26 NOTE — Discharge Summary (Signed)
NAME:  Bradley [name], Bradley [name] NO.:  [id number]   MEDICAL RECORD NO.:  [id number]          PATIENT TYPE:  INP   LOCATION:  3730                         FACILITY:  [facility]   PHYSICIAN:  Doylene Canning. Ladona Ridgel, MD    DATE OF BIRTH:  01/30/1941   DATE OF ADMISSION:  10/26/2006  DATE OF DISCHARGE:                               DISCHARGE SUMMARY   PRIMARY DISCHARGE DIAGNOSES:  1. Acute on chronic diastolic heart failure.  2. Atrial fibrillation with rapid ventricular response.   SECONDARY DIAGNOSES:  1. Chronic bronchitis.  2. Hypertension.  3. Diabetes.  4. Obesity.  5. Chronic Coumadin treatment.   PROCEDURES PERFORMED DURING HOSPITALIZATION:  None.   HISTORY OF PRESENT ILLNESS:  A 70 year old male with a history of  chronic bronchitis, atrial fibrillation, obesity, diabetes, and  hypertension, who noted increasing dyspnea with abdominal fullness prior  to admission.  The day before admission his dyspnea worsened and his  shortness of breath became more severe on the day of admission.  He also  complained of substernal chest pressure, abdominal distention, and  presented to the emergency room as a result of these symptoms.   HOSPITAL COURSE:  On arrival to the emergency room the patient was found  to be in atrial fibrillation with rapid ventricular response.  Heart  rate was found to be 124 beats per minute.  The patient was started on  IV Cardizem and digoxin and also IV Lasix.  The patient was found to be  in heart failure on initial evaluation.  Chest x-ray was completed  revealing cardiomegaly with perihilar interstitial opacities and  confluent lingular opacity; the appearance favors mildly asymmetric  edema although pneumonia in the lingual is a differential diagnostic  consideration.   The patient was admitted and cardiac enzymes were completed serially.  Troponin was 0.02, 0.03, and 0.02, respectively.  His BNP on admission  was 116.  Hemoglobin A1c was also found  to be within normal limits at  6.7.   The patient continued to improve over the course of his hospitalization.  He diuresed very well apparently had a diurese of about 1600 mL with  initial IV Lasix and continued to diurese throughout hospitalization.  Unfortunately, the patient was not weighed daily and I do not have a  discharge weight on the patient.   The patient did have a 2-D echo completed on October 24, 2006, this was  read by Dr. Dietrich Pates revealing difficult acoustic windows.  Overall,  LV systolic function appears normal, left ventricular wall thickness is  mildly to moderately increased.  There was mild to moderate mitral  annular calcification.  Left atrium was mild to moderately dilated,  estimated PAP by TR jet was approximately 45-50 mmHg, respectively.   During hospitalization the patient was also diagnosed with pneumonia and  maxillary sinusitis.  The patient was started on Avelox and guaifenesin  for congestion.  The patient continued to improve throughout  hospitalization, was seen and examined by Dr. Lewayne Bunting on October 26, 2006, and found to be stable for discharge.   LABS  ON DISCHARGE:  Revealed a hemoglobin of 15.6, hematocrit of 46.0,  white blood cells of 14.3, platelets of 236, PT 17.6, INR 1.4.  Sodium  140, potassium 3.6, chloride 101, CO2 30, glucose 148, BUN 14,  creatinine 0.89, calcium 9.0.  Hemoglobin A1c was 6.7.  Troponin's as  listed above.  TSH 1.240, BNP on discharge 65.   VITAL SIGNS ON DISCHARGE:  Blood pressure 144/80, pulse 70-110,  respirations 18-20.   FOLLOWUP APPOINTMENTS AND PLANS:  1. The patient is to be scheduled with Dr. Lewayne Bunting on December 01, 2006 at 9:30 a.m.  2. The patient is to follow up with [name] as previously scheduled.  3. The patient is to continue following [facility] with a      followup appointment scheduled for October 31, 2006 at 9:30 a.m.  4. The patient has had multiple medication changes  which will be      discussed with patient prior to discharge.  These include Toprol      new dose, Cardizem new medication, and new use of Avelox and      guaifenesin for the next 10 days.   DISCHARGE MEDICATIONS:  1. Avelox 400 mg once a day x10 days (new prescription).  2. Guaifenesin twice a day as needed for cough (new prescription).  3. Toprol-XL 50 mg (new lower dose) this was adjusted to avoid      bronchospasms per Dr. Ladona Ridgel.  4. Cardizem 240 mg daily (new prescription).  5. Lotrel 10/40 mg once a day.  6. Digoxin 0.25 mg once a day.  7. Flomax 0.04 mg once a day.  8. Coumadin per [facility].  9. Aspirin 81 mg once a day.  10.Glyburide 10 mg twice a day.   ALLERGIES:  NO KNOWN DRUG ALLERGIES.   Time spent with the patient to include physician time 35 minutes.      Bettey Mare. Lyman Bishop, NP      Doylene Canning. Ladona Ridgel, MD  Electronically Signed    KML/MEDQ  D:  10/26/2006  T:  10/26/2006  Job:  [id number]   cc:   [name], MD

## 2011-03-10 ENCOUNTER — Encounter: Payer: Self-pay | Admitting: Internal Medicine

## 2011-03-10 ENCOUNTER — Ambulatory Visit (AMBULATORY_SURGERY_CENTER): Payer: Medicare Other | Admitting: Internal Medicine

## 2011-03-10 VITALS — BP 139/79 | HR 66 | Temp 97.7°F | Resp 19 | Ht 68.0 in | Wt 212.0 lb

## 2011-03-10 DIAGNOSIS — Z1211 Encounter for screening for malignant neoplasm of colon: Secondary | ICD-10-CM

## 2011-03-10 DIAGNOSIS — K648 Other hemorrhoids: Secondary | ICD-10-CM

## 2011-03-10 HISTORY — PX: COLONOSCOPY: SHX174

## 2011-03-10 LAB — GLUCOSE, CAPILLARY
Glucose-Capillary: 127 mg/dL — ABNORMAL HIGH (ref 70–99)
Glucose-Capillary: 107 mg/dL — ABNORMAL HIGH (ref 70–99)

## 2011-03-10 MED ORDER — SODIUM CHLORIDE 0.9 % IV SOLN: 500.0000 mL | INTRAVENOUS | Status: DC

## 2011-03-10 NOTE — Patient Instructions (Signed)
Resume warfarin and have INR checked within 1 week at usual testing and monitoring clinic.

## 2011-03-11 ENCOUNTER — Telehealth: Payer: Self-pay | Admitting: *Deleted

## 2011-03-11 NOTE — Telephone Encounter (Signed)

## 2011-03-18 ENCOUNTER — Ambulatory Visit (INDEPENDENT_AMBULATORY_CARE_PROVIDER_SITE_OTHER): Payer: Medicare Other | Admitting: *Deleted

## 2011-03-18 DIAGNOSIS — Z7901 Long term (current) use of anticoagulants: Secondary | ICD-10-CM

## 2011-03-18 DIAGNOSIS — I4891 Unspecified atrial fibrillation: Secondary | ICD-10-CM

## 2011-03-18 LAB — POCT INR: INR: 2.4

## 2011-04-02 ENCOUNTER — Encounter: Payer: Medicare Other | Admitting: *Deleted

## 2011-04-06 ENCOUNTER — Ambulatory Visit (INDEPENDENT_AMBULATORY_CARE_PROVIDER_SITE_OTHER): Payer: Medicare Other | Admitting: *Deleted

## 2011-04-06 DIAGNOSIS — Z7901 Long term (current) use of anticoagulants: Secondary | ICD-10-CM

## 2011-04-06 DIAGNOSIS — I4891 Unspecified atrial fibrillation: Secondary | ICD-10-CM

## 2011-04-06 LAB — POCT INR: INR: 2.7

## 2011-04-15 ENCOUNTER — Encounter: Payer: Self-pay | Admitting: Internal Medicine

## 2011-04-15 ENCOUNTER — Ambulatory Visit (INDEPENDENT_AMBULATORY_CARE_PROVIDER_SITE_OTHER): Payer: Medicare Other | Admitting: Internal Medicine

## 2011-04-15 DIAGNOSIS — E119 Type 2 diabetes mellitus without complications: Secondary | ICD-10-CM

## 2011-04-15 DIAGNOSIS — I10 Essential (primary) hypertension: Secondary | ICD-10-CM

## 2011-04-15 DIAGNOSIS — E785 Hyperlipidemia, unspecified: Secondary | ICD-10-CM

## 2011-04-15 LAB — LIPID PANEL
Cholesterol: 146 mg/dL (ref 0–200)
HDL: 41.5 mg/dL (ref >39.00)
LDL Cholesterol: 73 mg/dL (ref 0–99)
Total CHOL/HDL Ratio: 4
Triglycerides: 159 mg/dL — ABNORMAL HIGH (ref 0.0–149.0)
VLDL: 31.8 mg/dL (ref 0.0–40.0)

## 2011-04-15 LAB — HEMOGLOBIN A1C: Hgb A1c MFr Bld: 6.1 % (ref 4.6–6.5)

## 2011-04-15 MED ORDER — ZOSTER VACCINE LIVE 19400 UNT/0.65ML ~~LOC~~ SOLR: 0.6500 mL | Freq: Once | SUBCUTANEOUS | Status: DC

## 2011-04-15 MED ORDER — FUROSEMIDE 20 MG PO TABS: 20.0000 mg | ORAL_TABLET | Freq: Two times a day (BID) | ORAL | Status: DC

## 2011-04-15 MED ORDER — NIACIN-LOVASTATIN ER 1000-20 MG PO TB24: 1.0000 | ORAL_TABLET | Freq: Every day | ORAL | Status: DC

## 2011-04-15 MED ORDER — GLYBURIDE 5 MG PO TABS: 20.0000 mg | ORAL_TABLET | Freq: Every day | ORAL | Status: DC

## 2011-04-15 MED ORDER — METFORMIN HCL 850 MG PO TABS: 850.0000 mg | ORAL_TABLET | Freq: Two times a day (BID) | ORAL | Status: DC

## 2011-04-15 MED ORDER — LOSARTAN POTASSIUM 100 MG PO TABS: 100.0000 mg | ORAL_TABLET | Freq: Every day | ORAL | Status: DC

## 2011-04-15 MED ORDER — SITAGLIPTIN PHOSPHATE 100 MG PO TABS: 100.0000 mg | ORAL_TABLET | Freq: Every day | ORAL | Status: DC

## 2011-04-15 MED ORDER — METOPROLOL TARTRATE 50 MG PO TABS: 50.0000 mg | ORAL_TABLET | Freq: Two times a day (BID) | ORAL | Status: DC

## 2011-04-15 MED ORDER — DIGOXIN 250 MCG PO TABS: 250.0000 ug | ORAL_TABLET | Freq: Every day | ORAL | Status: DC

## 2011-04-15 NOTE — Progress Notes (Signed)
  Subjective:    Patient ID: Bradley [name], male    DOB: 1941/10/10, 70 y.o.   MRN: [id number]  HPI Routine office visit, doing well, needs many of his medications refilled for 9 months  Past Medical History  Diagnosis Date  . HTN (hypertension)   . HLD (hyperlipidemia)   . DM (diabetes mellitus) 1999    adult onset  . Permanent atrial fibrillation     Dr. Ladona Ridgel  . BPH (benign prostatic hyperplasia)   . Leg ulcer 2011  . Chronic diastolic heart failure     Echocardiogram 12/25/10: EF 45-50%; moderate LVH; global hypokinesis; moderate biatrial enlargement; PASP 36;    Cardiac catheterization July 2008: No CAD; moderate pulmonary hypertension  . Pleural effusion 2008    Status post decortication  . Insomnia     Past Surgical History  Procedure Date  . Pleural scarification   . Lung decortication   . Cardiac catheterization   . Colonoscopy 03/10/11    normal     Review of Systems Good medication compliance with BP meds, ambulatory blood pressures 124/70 on average Diabetes, good medication compliance, blood sugars range from 91-120. Needs another Zostavax prescription, he was unable to get the shot  in 3 or 4 different pharmacies. Recommend to go to [place] [facility]    Objective:   Physical Exam  Constitutional: He is oriented to person, place, and time. He appears well-developed and well-nourished.  Cardiovascular:       Irregularly irregular  Pulmonary/Chest: Effort normal and breath sounds normal. No respiratory distress. He has no wheezes. He has no rales.  Musculoskeletal: He exhibits no edema.  Neurological: He is alert and oriented to person, place, and time.          Assessment & Plan:

## 2011-04-15 NOTE — Assessment & Plan Note (Signed)
Doing well 

## 2011-04-15 NOTE — Assessment & Plan Note (Signed)
Well controlled 

## 2011-04-20 ENCOUNTER — Telehealth: Payer: Self-pay | Admitting: *Deleted

## 2011-04-20 NOTE — Telephone Encounter (Signed)
Message left for patient to return my call.  

## 2011-04-20 NOTE — Telephone Encounter (Signed)
Message copied by Leanne Lovely on Tue Apr 20, 2011  9:47 AM ------      Message from: [name] E      Created: Mon Apr 19, 2011  8:17 PM       Advise patient:      DM and cholesterol well controlled , good results

## 2011-04-20 NOTE — Telephone Encounter (Signed)
Pt wife aware of labs.  

## 2011-05-04 ENCOUNTER — Ambulatory Visit (INDEPENDENT_AMBULATORY_CARE_PROVIDER_SITE_OTHER): Payer: Medicare Other | Admitting: *Deleted

## 2011-05-04 DIAGNOSIS — I4891 Unspecified atrial fibrillation: Secondary | ICD-10-CM

## 2011-05-04 DIAGNOSIS — Z7901 Long term (current) use of anticoagulants: Secondary | ICD-10-CM

## 2011-05-04 LAB — POCT INR: INR: 3

## 2011-05-18 ENCOUNTER — Ambulatory Visit (HOSPITAL_BASED_OUTPATIENT_CLINIC_OR_DEPARTMENT_OTHER)
Admission: RE | Admit: 2011-05-18 | Discharge: 2011-05-18 | Disposition: A | Discharge status: Home / Self Care | Payer: Medicare Other | Source: Ambulatory Visit | Attending: Family | Admitting: Family

## 2011-05-18 ENCOUNTER — Ambulatory Visit (INDEPENDENT_AMBULATORY_CARE_PROVIDER_SITE_OTHER): Payer: Medicare Other | Admitting: Family

## 2011-05-18 ENCOUNTER — Encounter: Payer: Self-pay | Admitting: Family

## 2011-05-18 ENCOUNTER — Telehealth: Payer: Self-pay | Admitting: Family

## 2011-05-18 VITALS — BP 136/80 | HR 84 | Temp 98.0°F | Resp 16 | Wt 220.0 lb

## 2011-05-18 DIAGNOSIS — R062 Wheezing: Secondary | ICD-10-CM

## 2011-05-18 DIAGNOSIS — R509 Fever, unspecified: Secondary | ICD-10-CM

## 2011-05-18 DIAGNOSIS — R059 Cough, unspecified: Secondary | ICD-10-CM

## 2011-05-18 DIAGNOSIS — R05 Cough: Secondary | ICD-10-CM | POA: Insufficient documentation

## 2011-05-18 DIAGNOSIS — J209 Acute bronchitis, unspecified: Secondary | ICD-10-CM

## 2011-05-18 MED ORDER — ALBUTEROL SULFATE HFA 108 (90 BASE) MCG/ACT IN AERS: 2.0000 | INHALATION_SPRAY | Freq: Four times a day (QID) | RESPIRATORY_TRACT | Status: DC | PRN

## 2011-05-18 MED ORDER — CEFUROXIME AXETIL 500 MG PO TABS: 500.0000 mg | ORAL_TABLET | Freq: Two times a day (BID) | ORAL | Status: AC

## 2011-05-18 NOTE — Telephone Encounter (Signed)
Please complete your chest x-ray on the first floor. We will call with results.

## 2011-05-18 NOTE — Telephone Encounter (Signed)
Called pt, reviewed x-ray results.  Plan to treat with ceftin for bronchitis.  Will notify Weston Brass PharmD in coumadin clinic.  Pt is instructed to follow up with [name] in 1 week, sooner if symptoms worsen or do not improve.  He verbalizes understanding.

## 2011-05-18 NOTE — Patient Instructions (Addendum)
Please complete your chest xray on the first floor.

## 2011-05-18 NOTE — Progress Notes (Signed)
Subjective:    Patient ID: Bradley [name], male    DOB: 12/24/1940, 70 y.o.   MRN: [id number]  HPI  Bradley [name] is a 70 yr old male who presents today with chief complaint of cough/runny nose x 3 days.  Denies sore throat.  Had a temp of 99.1.  Has history of recurrent pneumonia.  He is maintained on coumadin for a history of AF.  Cough is productive of clear phlegm.  Denies sinus pain, nausea, vomitting, diarrhea or chest pain.       Review of Systems See HPI  Past Medical History  Diagnosis Date  . HTN (hypertension)   . HLD (hyperlipidemia)   . DM (diabetes mellitus) 1999    adult onset  . Permanent atrial fibrillation     Dr. Ladona Ridgel  . BPH (benign prostatic hyperplasia)   . Leg ulcer 2011  . Chronic diastolic heart failure     Echocardiogram 12/25/10: EF 45-50%; moderate LVH; global hypokinesis; moderate biatrial enlargement; PASP 36;    Cardiac catheterization July 2008: No CAD; moderate pulmonary hypertension  . Pleural effusion 2008    Status post decortication  . Insomnia     History   Social History  . Marital Status: Married    Spouse Name: N/A    Number of Children: 1  . 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 Smoking status: Former Smoker -- 2.5 packs/day for 25 years    Types: Cigarettes    Quit date: 10/11/1980  . Smokeless tobacco: Never Used  . Alcohol Use: Yes     occas  . Drug Use: No  . Sexually Active: Not on file   Other Topics Concern  . Not on file   Social History Narrative   Moved from [place] 2003He lives w/ his wife     Past Surgical History  Procedure Date  . Pleural scarification   . Lung decortication   . Cardiac catheterization   . Colonoscopy 03/10/11    normal    Family History  Problem Relation Age of Onset  . Diabetes Father   . Breast cancer Maternal Aunt   . Heart attack Neg Hx   . Prostate cancer Neg Hx   . Colon cancer Neg Hx     Allergies    Allergen Reactions  . Tadalafil     REACTION: HA, backache    Current Outpatient Prescriptions on File Prior to Visit  Medication Sig Dispense Refill  . aspirin 325 MG tablet Take 325 mg by mouth daily.        Marland Kitchen BAYER CONTOUR TEST test strip as directed.      Marland Kitchen dextromethorphan-guaiFENesin (MUCINEX DM) 30-600 MG per 12 hr tablet Take 1 tablet by mouth as needed.        . digoxin (LANOXIN) 0.25 MG tablet Take 1 tablet (250 mcg total) by mouth daily.  90 tablet  3  . furosemide (LASIX) 20 MG tablet Take 1 tablet (20 mg total) by mouth 2 (two) times daily.  90 tablet  3  . glyBURIDE (DIABETA) 5 MG tablet Take 4 tablets (20 mg total) by mouth daily.  360 tablet  3  . losartan (COZAAR) 100 MG tablet Take 1 tablet (100 mg total) by mouth daily.  90 tablet  3  . LOTREL 10-40 MG per capsule Take 1 tablet by mouth daily.      . metFORMIN (GLUCOPHAGE) 850  MG tablet Take 1 tablet (850 mg total) by mouth 2 (two) times daily with a meal.  180 tablet  3  . metoprolol (LOPRESSOR) 50 MG tablet Take 1 tablet (50 mg total) by mouth 2 (two) times daily.  180 tablet  3  . Multiple Vitamin (MULTIVITAMIN) tablet Take 1 tablet by mouth daily.        . niacin-lovastatin (ADVICOR) 1000-20 MG 24 hr tablet Take 1 tablet by mouth daily.  90 tablet  3  . sitaGLIPtin (JANUVIA) 100 MG tablet Take 1 tablet (100 mg total) by mouth daily.  90 tablet  3  . warfarin (COUMADIN) 5 MG tablet Take 1 tablet (5 mg total) by mouth as directed.  90 tablet  0  . zolpidem (AMBIEN) 10 MG tablet Take 10 mg by mouth at bedtime as needed.        . zoster vaccine live, PF, (ZOSTAVAX) 16109 UNT/0.65ML injection Inject 19,400 Units into the skin once.  1 vial  0   Current Facility-Administered Medications on File Prior to Visit  Medication Dose Route Frequency Provider Last Rate Last Dose  . 0.9 %  sodium chloride infusion  500 mL Intravenous Continuous Iva Boop, MD        BP 136/80  Pulse 84  Temp(Src) 98 F (36.7 C) (Oral)   Resp 16  Wt 220 lb (99.791 kg)  SpO2 94%       Objective:   Physical Exam  Constitutional: He appears well-developed and well-nourished.  HENT:  Head: Normocephalic and atraumatic.  Eyes: Conjunctivae are normal.  Neck: Neck supple.  Cardiovascular: Normal rate and regular rhythm.   No murmur heard. Pulmonary/Chest: Effort normal. No respiratory distress.       Soft right sided exp wheeze.    Skin: Skin is warm and dry.  Psychiatric: He has a normal mood and affect. His behavior is normal. Judgment and thought content normal.          Assessment & Plan:

## 2011-05-20 DIAGNOSIS — J209 Acute bronchitis, unspecified: Secondary | ICD-10-CM | POA: Insufficient documentation

## 2011-05-20 NOTE — Assessment & Plan Note (Signed)
Chest x-ray performed notes mild peribronchial thickening.  No pneumonia.  Mild wheezing today- add albuterol MDI.  Treat with ceftin for bronchitis.

## 2011-05-21 ENCOUNTER — Telehealth: Payer: Self-pay | Admitting: Internal Medicine

## 2011-05-21 DIAGNOSIS — R911 Solitary pulmonary nodule: Secondary | ICD-10-CM

## 2011-05-21 NOTE — Telephone Encounter (Signed)
Needs a followup CT of the chest without contrast material---------------> please arrange  DX nodule on the right lower lobe, see CT from 09/30/2010

## 2011-05-25 ENCOUNTER — Ambulatory Visit (INDEPENDENT_AMBULATORY_CARE_PROVIDER_SITE_OTHER)
Admission: RE | Admit: 2011-05-25 | Discharge: 2011-05-25 | Disposition: A | Discharge status: Home / Self Care | Payer: Medicare Other | Source: Ambulatory Visit | Attending: Internal Medicine | Admitting: Internal Medicine

## 2011-05-25 DIAGNOSIS — J984 Other disorders of lung: Secondary | ICD-10-CM

## 2011-05-26 ENCOUNTER — Ambulatory Visit (INDEPENDENT_AMBULATORY_CARE_PROVIDER_SITE_OTHER): Payer: Medicare Other | Admitting: Internal Medicine

## 2011-05-26 ENCOUNTER — Encounter: Payer: Self-pay | Admitting: Internal Medicine

## 2011-05-26 DIAGNOSIS — R9389 Abnormal findings on diagnostic imaging of other specified body structures: Secondary | ICD-10-CM | POA: Insufficient documentation

## 2011-05-26 DIAGNOSIS — R918 Other nonspecific abnormal finding of lung field: Secondary | ICD-10-CM

## 2011-05-26 DIAGNOSIS — J209 Acute bronchitis, unspecified: Secondary | ICD-10-CM

## 2011-05-26 MED ORDER — NIACIN-LOVASTATIN ER 1000-20 MG PO TB24: 1.0000 | ORAL_TABLET | Freq: Every day | ORAL | Status: DC

## 2011-05-26 NOTE — Assessment & Plan Note (Signed)
Much improved, recommend to take albuterol only as needed.

## 2011-05-26 NOTE — Progress Notes (Signed)
  Subjective:    Patient ID: Bradley [name], male    DOB: 09/29/1941, 70 y.o.   MRN: [id number]  HPI Was seen recently with bronchitis, took abx and is still using albuterol as needed. Feeling better. Also likes to review the results of the CT scan done yesterday.  Past Medical History  Diagnosis Date  . HTN (hypertension)   . HLD (hyperlipidemia)   . DM (diabetes mellitus) 1999    adult onset  . Permanent atrial fibrillation     Dr. Ladona Ridgel  . BPH (benign prostatic hyperplasia)   . Leg ulcer 2011  . Chronic diastolic heart failure     Echocardiogram 12/25/10: EF 45-50%; moderate LVH; global hypokinesis; moderate biatrial enlargement; PASP 36;    Cardiac catheterization July 2008: No CAD; moderate pulmonary hypertension  . Pleural effusion 2008    Status post decortication  . Insomnia    Past Surgical History  Procedure Date  . Pleural scarification   . Lung decortication   . Cardiac catheterization   . Colonoscopy 03/10/11    normal      Review of Systems Respiratory symptoms much improved, no shortness or breath, decreased wheezing. No fever. No history of asthma or COPD. Wonders if he could take a "energy supplement" , he showed me the bottle, it has a number of ingredients including caffeine and others that I don't recognize. Recommend against taking such supplements.     Objective:   Physical Exam  Constitutional: He appears well-developed and well-nourished. No distress.  HENT:  Head: Normocephalic and atraumatic.  Cardiovascular:       Irregular   Pulmonary/Chest: Effort normal and breath sounds normal. No respiratory distress. He has no wheezes. He has no rales.  Musculoskeletal: He exhibits no edema.  Skin: He is not diaphoretic.          Assessment & Plan:

## 2011-05-26 NOTE — Assessment & Plan Note (Addendum)
History of abnormal CTs, he had a CT yesterday,  It actually showed an improvement of the abnormalities. No further tests needed , pt aware

## 2011-06-01 ENCOUNTER — Encounter: Payer: Medicare Other | Admitting: *Deleted

## 2011-06-04 ENCOUNTER — Ambulatory Visit (INDEPENDENT_AMBULATORY_CARE_PROVIDER_SITE_OTHER): Payer: Medicare Other | Admitting: *Deleted

## 2011-06-04 DIAGNOSIS — I4891 Unspecified atrial fibrillation: Secondary | ICD-10-CM

## 2011-06-04 DIAGNOSIS — Z7901 Long term (current) use of anticoagulants: Secondary | ICD-10-CM

## 2011-06-04 LAB — POCT INR: INR: 2.4

## 2011-07-02 ENCOUNTER — Ambulatory Visit (INDEPENDENT_AMBULATORY_CARE_PROVIDER_SITE_OTHER): Payer: Medicare Other | Admitting: *Deleted

## 2011-07-02 DIAGNOSIS — Z7901 Long term (current) use of anticoagulants: Secondary | ICD-10-CM

## 2011-07-02 DIAGNOSIS — I4891 Unspecified atrial fibrillation: Secondary | ICD-10-CM

## 2011-07-02 LAB — POCT INR: INR: 2

## 2011-07-13 LAB — DIFFERENTIAL
Basophils Absolute: 0 10*3/uL (ref 0.0–0.1)
Basophils Relative: 0 % (ref 0–1)
Eosinophils Absolute: 0.1 10*3/uL (ref 0.0–0.7)
Eosinophils Relative: 0 % (ref 0–5)
Lymphocytes Relative: 13 % (ref 12–46)
Lymphs Abs: 2 10*3/uL (ref 0.7–4.0)
Monocytes Absolute: 0.3 10*3/uL (ref 0.1–1.0)
Monocytes Relative: 2 % — ABNORMAL LOW (ref 3–12)
Neutro Abs: 12.9 10*3/uL — ABNORMAL HIGH (ref 1.7–7.7)
Neutrophils Relative %: 84 % — ABNORMAL HIGH (ref 43–77)

## 2011-07-13 LAB — BLOOD GAS, ARTERIAL
Acid-base deficit: 1.5 mmol/L (ref 0.0–2.0)
Bicarbonate: 21.3 mEq/L (ref 20.0–24.0)
O2 Content: 3 L/min
O2 Saturation: 91.3 %
Patient temperature: 98.6
TCO2: 18.8 mmol/L (ref 0–100)
pCO2 arterial: 31.9 mmHg — ABNORMAL LOW (ref 35.0–45.0)
pH, Arterial: 7.44 (ref 7.350–7.450)
pO2, Arterial: 66.2 mmHg — ABNORMAL LOW (ref 80.0–100.0)

## 2011-07-13 LAB — CULTURE, BLOOD (ROUTINE X 2)
Culture: NO GROWTH
Culture: NO GROWTH

## 2011-07-13 LAB — B-NATRIURETIC PEPTIDE (CONVERTED LAB): Pro B Natriuretic peptide (BNP): 102 pg/mL — ABNORMAL HIGH (ref 0.0–100.0)

## 2011-07-13 LAB — URINALYSIS, ROUTINE W REFLEX MICROSCOPIC
Bilirubin Urine: NEGATIVE
Glucose, UA: NEGATIVE mg/dL
Hgb urine dipstick: NEGATIVE
Ketones, ur: NEGATIVE mg/dL
Leukocytes, UA: NEGATIVE
Nitrite: NEGATIVE
Protein, ur: 30 mg/dL — AB
Specific Gravity, Urine: 1.012 (ref 1.005–1.030)
Urobilinogen, UA: 1 mg/dL (ref 0.0–1.0)
pH: 5.5 (ref 5.0–8.0)

## 2011-07-13 LAB — H1N1 SCREEN (PCR): H1N1 Virus Scrn: NOT DETECTED

## 2011-07-13 LAB — CBC
HCT: 46.7 % (ref 39.0–52.0)
Hemoglobin: 15.6 g/dL (ref 13.0–17.0)
MCHC: 33.4 g/dL (ref 30.0–36.0)
MCV: 91.3 fL (ref 78.0–100.0)
Platelets: 231 10*3/uL (ref 150–400)
RBC: 5.11 MIL/uL (ref 4.22–5.81)
RDW: 14.7 % (ref 11.5–15.5)
WBC: 15.3 10*3/uL — ABNORMAL HIGH (ref 4.0–10.5)

## 2011-07-13 LAB — POCT CARDIAC MARKERS
CKMB, poc: 1.3 ng/mL (ref 1.0–8.0)
Myoglobin, poc: 39.7 ng/mL (ref 12–200)
Troponin i, poc: <0.05 ng/mL (ref 0.00–0.09)

## 2011-07-13 LAB — D-DIMER, QUANTITATIVE: D-Dimer, Quant: 0.44 ug/mL-FEU (ref 0.00–0.48)

## 2011-07-13 LAB — LEGIONELLA ANTIGEN, URINE: Legionella Antigen, Urine: NEGATIVE

## 2011-07-13 LAB — BASIC METABOLIC PANEL
BUN: 14 mg/dL (ref 6–23)
CO2: 27 mEq/L (ref 19–32)
Calcium: 9 mg/dL (ref 8.4–10.5)
Chloride: 104 mEq/L (ref 96–112)
Creatinine, Ser: 0.94 mg/dL (ref 0.4–1.5)
GFR calc Af Amer: >60 mL/min (ref >60)
GFR calc non Af Amer: >60 mL/min (ref >60)
Glucose, Bld: 162 mg/dL — ABNORMAL HIGH (ref 70–99)
Potassium: 4.4 mEq/L (ref 3.5–5.1)
Sodium: 141 mEq/L (ref 135–145)

## 2011-07-13 LAB — GLUCOSE, CAPILLARY
Glucose-Capillary: 113 mg/dL — ABNORMAL HIGH (ref 70–99)
Glucose-Capillary: 68 mg/dL — ABNORMAL LOW (ref 70–99)
Glucose-Capillary: 87 mg/dL (ref 70–99)
Glucose-Capillary: 91 mg/dL (ref 70–99)

## 2011-07-13 LAB — CK TOTAL AND CKMB (NOT AT ARMC)
CK, MB: 1.3 ng/mL (ref 0.3–4.0)
Relative Index: INVALID (ref 0.0–2.5)
Total CK: 98 U/L (ref 7–232)

## 2011-07-13 LAB — TROPONIN I: Troponin I: 0.04 ng/mL (ref 0.00–0.06)

## 2011-07-13 LAB — URINE MICROSCOPIC-ADD ON

## 2011-07-13 LAB — PROTIME-INR
INR: 2.5 — ABNORMAL HIGH (ref 0.00–1.49)
Prothrombin Time: 28.5 seconds — ABNORMAL HIGH (ref 11.6–15.2)

## 2011-07-13 LAB — DIGOXIN LEVEL: Digoxin Level: 0.5 ng/mL — ABNORMAL LOW (ref 0.8–2.0)

## 2011-07-13 LAB — SEDIMENTATION RATE: Sed Rate: 13 mm/hr (ref 0–16)

## 2011-07-13 LAB — APTT: aPTT: 42 seconds — ABNORMAL HIGH (ref 24–37)

## 2011-07-16 LAB — COMPREHENSIVE METABOLIC PANEL
ALT: 79 U/L — ABNORMAL HIGH (ref 0–53)
AST: 75 U/L — ABNORMAL HIGH (ref 0–37)
Albumin: 2.5 g/dL — ABNORMAL LOW (ref 3.5–5.2)
Alkaline Phosphatase: 66 U/L (ref 39–117)
BUN: 11 mg/dL (ref 6–23)
CO2: 30 mEq/L (ref 19–32)
Calcium: 8.2 mg/dL — ABNORMAL LOW (ref 8.4–10.5)
Chloride: 101 mEq/L (ref 96–112)
Creatinine, Ser: 1.04 mg/dL (ref 0.4–1.5)
GFR calc Af Amer: >60 mL/min (ref >60)
GFR calc non Af Amer: >60 mL/min (ref >60)
Glucose, Bld: 94 mg/dL (ref 70–99)
Potassium: 4.3 mEq/L (ref 3.5–5.1)
Sodium: 136 mEq/L (ref 135–145)
Total Bilirubin: 1.1 mg/dL (ref 0.3–1.2)
Total Protein: 5.7 g/dL — ABNORMAL LOW (ref 6.0–8.3)

## 2011-07-16 LAB — GLUCOSE, CAPILLARY
Glucose-Capillary: 102 mg/dL — ABNORMAL HIGH (ref 70–99)
Glucose-Capillary: 107 mg/dL — ABNORMAL HIGH (ref 70–99)
Glucose-Capillary: 108 mg/dL — ABNORMAL HIGH (ref 70–99)
Glucose-Capillary: 108 mg/dL — ABNORMAL HIGH (ref 70–99)
Glucose-Capillary: 110 mg/dL — ABNORMAL HIGH (ref 70–99)
Glucose-Capillary: 111 mg/dL — ABNORMAL HIGH (ref 70–99)
Glucose-Capillary: 113 mg/dL — ABNORMAL HIGH (ref 70–99)
Glucose-Capillary: 120 mg/dL — ABNORMAL HIGH (ref 70–99)
Glucose-Capillary: 123 mg/dL — ABNORMAL HIGH (ref 70–99)
Glucose-Capillary: 125 mg/dL — ABNORMAL HIGH (ref 70–99)
Glucose-Capillary: 126 mg/dL — ABNORMAL HIGH (ref 70–99)
Glucose-Capillary: 126 mg/dL — ABNORMAL HIGH (ref 70–99)
Glucose-Capillary: 132 mg/dL — ABNORMAL HIGH (ref 70–99)
Glucose-Capillary: 136 mg/dL — ABNORMAL HIGH (ref 70–99)
Glucose-Capillary: 137 mg/dL — ABNORMAL HIGH (ref 70–99)
Glucose-Capillary: 137 mg/dL — ABNORMAL HIGH (ref 70–99)
Glucose-Capillary: 139 mg/dL — ABNORMAL HIGH (ref 70–99)
Glucose-Capillary: 140 mg/dL — ABNORMAL HIGH (ref 70–99)
Glucose-Capillary: 142 mg/dL — ABNORMAL HIGH (ref 70–99)
Glucose-Capillary: 142 mg/dL — ABNORMAL HIGH (ref 70–99)
Glucose-Capillary: 143 mg/dL — ABNORMAL HIGH (ref 70–99)
Glucose-Capillary: 144 mg/dL — ABNORMAL HIGH (ref 70–99)
Glucose-Capillary: 145 mg/dL — ABNORMAL HIGH (ref 70–99)
Glucose-Capillary: 177 mg/dL — ABNORMAL HIGH (ref 70–99)
Glucose-Capillary: 193 mg/dL — ABNORMAL HIGH (ref 70–99)
Glucose-Capillary: 66 mg/dL — ABNORMAL LOW (ref 70–99)
Glucose-Capillary: 80 mg/dL (ref 70–99)
Glucose-Capillary: 80 mg/dL (ref 70–99)
Glucose-Capillary: 90 mg/dL (ref 70–99)
Glucose-Capillary: 94 mg/dL (ref 70–99)

## 2011-07-16 LAB — BASIC METABOLIC PANEL
BUN: 13 mg/dL (ref 6–23)
BUN: 14 mg/dL (ref 6–23)
BUN: 23 mg/dL (ref 6–23)
BUN: 31 mg/dL — ABNORMAL HIGH (ref 6–23)
CO2: 24 mEq/L (ref 19–32)
CO2: 25 mEq/L (ref 19–32)
CO2: 25 mEq/L (ref 19–32)
CO2: 27 mEq/L (ref 19–32)
Calcium: 8.1 mg/dL — ABNORMAL LOW (ref 8.4–10.5)
Calcium: 8.2 mg/dL — ABNORMAL LOW (ref 8.4–10.5)
Calcium: 8.3 mg/dL — ABNORMAL LOW (ref 8.4–10.5)
Calcium: 8.4 mg/dL (ref 8.4–10.5)
Chloride: 102 mEq/L (ref 96–112)
Chloride: 102 mEq/L (ref 96–112)
Chloride: 103 mEq/L (ref 96–112)
Chloride: 107 mEq/L (ref 96–112)
Creatinine, Ser: 1.01 mg/dL (ref 0.4–1.5)
Creatinine, Ser: 1.02 mg/dL (ref 0.4–1.5)
Creatinine, Ser: 1.29 mg/dL (ref 0.4–1.5)
Creatinine, Ser: 1.68 mg/dL — ABNORMAL HIGH (ref 0.4–1.5)
GFR calc Af Amer: 50 mL/min — ABNORMAL LOW (ref >60)
GFR calc Af Amer: >60 mL/min (ref >60)
GFR calc Af Amer: >60 mL/min (ref >60)
GFR calc Af Amer: >60 mL/min (ref >60)
GFR calc non Af Amer: 41 mL/min — ABNORMAL LOW (ref >60)
GFR calc non Af Amer: 56 mL/min — ABNORMAL LOW (ref >60)
GFR calc non Af Amer: >60 mL/min (ref >60)
GFR calc non Af Amer: >60 mL/min (ref >60)
Glucose, Bld: 102 mg/dL — ABNORMAL HIGH (ref 70–99)
Glucose, Bld: 114 mg/dL — ABNORMAL HIGH (ref 70–99)
Glucose, Bld: 93 mg/dL (ref 70–99)
Glucose, Bld: 95 mg/dL (ref 70–99)
Potassium: 3.9 mEq/L (ref 3.5–5.1)
Potassium: 4 mEq/L (ref 3.5–5.1)
Potassium: 4.2 mEq/L (ref 3.5–5.1)
Potassium: 4.9 mEq/L (ref 3.5–5.1)
Sodium: 135 mEq/L (ref 135–145)
Sodium: 135 mEq/L (ref 135–145)
Sodium: 136 mEq/L (ref 135–145)
Sodium: 138 mEq/L (ref 135–145)

## 2011-07-16 LAB — CBC
HCT: 34.3 % — ABNORMAL LOW (ref 39.0–52.0)
HCT: 34.6 % — ABNORMAL LOW (ref 39.0–52.0)
HCT: 35.5 % — ABNORMAL LOW (ref 39.0–52.0)
HCT: 38 % — ABNORMAL LOW (ref 39.0–52.0)
HCT: 39.4 % (ref 39.0–52.0)
Hemoglobin: 11.5 g/dL — ABNORMAL LOW (ref 13.0–17.0)
Hemoglobin: 11.6 g/dL — ABNORMAL LOW (ref 13.0–17.0)
Hemoglobin: 12.2 g/dL — ABNORMAL LOW (ref 13.0–17.0)
Hemoglobin: 12.8 g/dL — ABNORMAL LOW (ref 13.0–17.0)
Hemoglobin: 13.3 g/dL (ref 13.0–17.0)
MCHC: 33.5 g/dL (ref 30.0–36.0)
MCHC: 33.6 g/dL (ref 30.0–36.0)
MCHC: 33.7 g/dL (ref 30.0–36.0)
MCHC: 33.8 g/dL (ref 30.0–36.0)
MCHC: 34.2 g/dL (ref 30.0–36.0)
MCV: 90.4 fL (ref 78.0–100.0)
MCV: 91.1 fL (ref 78.0–100.0)
MCV: 91.1 fL (ref 78.0–100.0)
MCV: 91.2 fL (ref 78.0–100.0)
MCV: 91.2 fL (ref 78.0–100.0)
Platelets: 126 10*3/uL — ABNORMAL LOW (ref 150–400)
Platelets: 128 10*3/uL — ABNORMAL LOW (ref 150–400)
Platelets: 137 10*3/uL — ABNORMAL LOW (ref 150–400)
Platelets: 151 10*3/uL (ref 150–400)
Platelets: 154 10*3/uL (ref 150–400)
RBC: 3.76 MIL/uL — ABNORMAL LOW (ref 4.22–5.81)
RBC: 3.83 MIL/uL — ABNORMAL LOW (ref 4.22–5.81)
RBC: 3.9 MIL/uL — ABNORMAL LOW (ref 4.22–5.81)
RBC: 4.17 MIL/uL — ABNORMAL LOW (ref 4.22–5.81)
RBC: 4.32 MIL/uL (ref 4.22–5.81)
RDW: 13.9 % (ref 11.5–15.5)
RDW: 14.5 % (ref 11.5–15.5)
RDW: 14.6 % (ref 11.5–15.5)
RDW: 15.1 % (ref 11.5–15.5)
RDW: 15.5 % (ref 11.5–15.5)
WBC: 13.4 10*3/uL — ABNORMAL HIGH (ref 4.0–10.5)
WBC: 15.7 10*3/uL — ABNORMAL HIGH (ref 4.0–10.5)
WBC: 20.6 10*3/uL — ABNORMAL HIGH (ref 4.0–10.5)
WBC: 6.7 10*3/uL (ref 4.0–10.5)
WBC: 9.6 10*3/uL (ref 4.0–10.5)

## 2011-07-16 LAB — PROTIME-INR
INR: 1.4 (ref 0.00–1.49)
INR: 2 — ABNORMAL HIGH (ref 0.00–1.49)
INR: 2.4 — ABNORMAL HIGH (ref 0.00–1.49)
INR: 2.4 — ABNORMAL HIGH (ref 0.00–1.49)
INR: 2.8 — ABNORMAL HIGH (ref 0.00–1.49)
INR: 3 — ABNORMAL HIGH (ref 0.00–1.49)
INR: 3.2 — ABNORMAL HIGH (ref 0.00–1.49)
INR: 4.6 — ABNORMAL HIGH (ref 0.00–1.49)
Prothrombin Time: 18.2 seconds — ABNORMAL HIGH (ref 11.6–15.2)
Prothrombin Time: 24 seconds — ABNORMAL HIGH (ref 11.6–15.2)
Prothrombin Time: 27.9 seconds — ABNORMAL HIGH (ref 11.6–15.2)
Prothrombin Time: 28.1 seconds — ABNORMAL HIGH (ref 11.6–15.2)
Prothrombin Time: 31.9 seconds — ABNORMAL HIGH (ref 11.6–15.2)
Prothrombin Time: 33.3 seconds — ABNORMAL HIGH (ref 11.6–15.2)
Prothrombin Time: 34.9 seconds — ABNORMAL HIGH (ref 11.6–15.2)
Prothrombin Time: 47.6 seconds — ABNORMAL HIGH (ref 11.6–15.2)

## 2011-07-16 LAB — B-NATRIURETIC PEPTIDE (CONVERTED LAB): Pro B Natriuretic peptide (BNP): 178 pg/mL — ABNORMAL HIGH (ref 0.0–100.0)

## 2011-07-16 LAB — STREP PNEUMONIAE URINARY ANTIGEN: Strep Pneumo Urinary Antigen: NEGATIVE

## 2011-07-16 LAB — CULTURE, BLOOD (ROUTINE X 2)
Culture: NO GROWTH
Culture: NO GROWTH

## 2011-07-16 LAB — VANCOMYCIN, TROUGH: Vancomycin Tr: 15.3 ug/mL (ref 10.0–20.0)

## 2011-07-16 LAB — SEDIMENTATION RATE: Sed Rate: 74 mm/hr — ABNORMAL HIGH (ref 0–16)

## 2011-07-20 LAB — BLOOD GAS, ARTERIAL
Acid-Base Excess: 1.3
Acid-Base Excess: 2.2 — ABNORMAL HIGH
Bicarbonate: 25.1 — ABNORMAL HIGH
Bicarbonate: 26.4 — ABNORMAL HIGH
FIO2: 0.21
O2 Content: 3
O2 Saturation: 94.2
O2 Saturation: 95.8
Patient temperature: 98.6
Patient temperature: 98.6
TCO2: 26.3
TCO2: 27.7
pCO2 arterial: 37.6
pCO2 arterial: 42.2
pH, Arterial: 7.413
pH, Arterial: 7.44
pO2, Arterial: 71.4 — ABNORMAL LOW
pO2, Arterial: 75.4 — ABNORMAL LOW

## 2011-07-20 LAB — COMPREHENSIVE METABOLIC PANEL
ALT: 27
ALT: 37
AST: 18
AST: 36
Albumin: 2.5 — ABNORMAL LOW
Albumin: 3.2 — ABNORMAL LOW
Alkaline Phosphatase: 107
Alkaline Phosphatase: 92
BUN: 11
BUN: 9
CO2: 24
CO2: 29
Calcium: 8.4
Calcium: 8.9
Chloride: 106
Chloride: 98
Creatinine, Ser: 0.66
Creatinine, Ser: 0.8
GFR calc Af Amer: >60
GFR calc Af Amer: >60
GFR calc non Af Amer: >60
GFR calc non Af Amer: >60
Glucose, Bld: 124 — ABNORMAL HIGH
Glucose, Bld: 155 — ABNORMAL HIGH
Potassium: 4.6
Potassium: 4.7
Sodium: 133 — ABNORMAL LOW
Sodium: 135
Total Bilirubin: 0.5
Total Bilirubin: 1.1
Total Protein: 6
Total Protein: 6.3

## 2011-07-20 LAB — TYPE AND SCREEN
ABO/RH(D): AB POS
Antibody Screen: NEGATIVE

## 2011-07-20 LAB — CBC
HCT: 32 — ABNORMAL LOW
HCT: 34.5 — ABNORMAL LOW
HCT: 37.5 — ABNORMAL LOW
Hemoglobin: 10.8 — ABNORMAL LOW
Hemoglobin: 11.6 — ABNORMAL LOW
Hemoglobin: 12.5 — ABNORMAL LOW
MCHC: 33.3
MCHC: 33.5
MCHC: 33.8
MCV: 85.3
MCV: 85.9
MCV: 86.1
Platelets: 340
Platelets: 352
Platelets: 408 — ABNORMAL HIGH
RBC: 3.72 — ABNORMAL LOW
RBC: 4.04 — ABNORMAL LOW
RBC: 4.36
RDW: 14.7 — ABNORMAL HIGH
RDW: 15 — ABNORMAL HIGH
RDW: 15.2 — ABNORMAL HIGH
WBC: 11.5 — ABNORMAL HIGH
WBC: 12.1 — ABNORMAL HIGH
WBC: 7.7

## 2011-07-20 LAB — ANAEROBIC CULTURE: Gram Stain: NONE SEEN

## 2011-07-20 LAB — URINALYSIS, ROUTINE W REFLEX MICROSCOPIC
Bilirubin Urine: NEGATIVE
Glucose, UA: NEGATIVE
Glucose, UA: NEGATIVE
Hgb urine dipstick: NEGATIVE
Ketones, ur: 15 — AB
Ketones, ur: 15 — AB
Nitrite: NEGATIVE
Nitrite: NEGATIVE
Protein, ur: 100 — AB
Protein, ur: NEGATIVE
Specific Gravity, Urine: 1.024
Specific Gravity, Urine: 1.029
Urobilinogen, UA: 1
Urobilinogen, UA: 1
pH: 5.5
pH: 5.5

## 2011-07-20 LAB — URINE CULTURE
Colony Count: 35000
Special Requests: NEGATIVE

## 2011-07-20 LAB — BODY FLUID CULTURE
Culture: NO GROWTH
Gram Stain: NONE SEEN

## 2011-07-20 LAB — BASIC METABOLIC PANEL
BUN: 9
CO2: 26
Calcium: 8.3 — ABNORMAL LOW
Chloride: 100
Creatinine, Ser: 0.69
GFR calc Af Amer: >60
GFR calc non Af Amer: >60
Glucose, Bld: 154 — ABNORMAL HIGH
Potassium: 4.5
Sodium: 131 — ABNORMAL LOW

## 2011-07-20 LAB — ABO/RH: ABO/RH(D): AB POS

## 2011-07-20 LAB — URINE MICROSCOPIC-ADD ON

## 2011-07-20 LAB — PROTIME-INR
INR: 1.1
Prothrombin Time: 14.2

## 2011-07-20 LAB — APTT: aPTT: 31

## 2011-07-26 LAB — BODY FLUID CELL COUNT WITH DIFFERENTIAL
Eos, Fluid: 4
Lymphs, Fluid: 46
Monocyte-Macrophage-Serous Fluid: 14 — ABNORMAL LOW
Neutrophil Count, Fluid: 36 — ABNORMAL HIGH
Total Nucleated Cell Count, Fluid: 540

## 2011-07-26 LAB — POCT I-STAT 3, VENOUS BLOOD GAS (G3P V)
Acid-base deficit: 4 — ABNORMAL HIGH
Bicarbonate: 23.1
Bicarbonate: 25.7 — ABNORMAL HIGH
O2 Saturation: 58
O2 Saturation: 62
Operator id: 221371
Operator id: 221371
TCO2: 25
TCO2: 27
pCO2, Ven: 47.3
pCO2, Ven: 48.3
pH, Ven: 7.287
pH, Ven: 7.344 — ABNORMAL HIGH
pO2, Ven: 34
pO2, Ven: 34

## 2011-07-26 LAB — POCT I-STAT 3, ART BLOOD GAS (G3+)
Acid-base deficit: 1
Bicarbonate: 24.3 — ABNORMAL HIGH
O2 Saturation: 92
Operator id: 221371
TCO2: 26
pCO2 arterial: 41.3
pH, Arterial: 7.378
pO2, Arterial: 66 — ABNORMAL LOW

## 2011-07-26 LAB — PROTEIN, BODY FLUID: Total protein, fluid: 3.7

## 2011-07-26 LAB — GLUCOSE, SEROUS FLUID: Glucose, Fluid: 178

## 2011-07-26 LAB — AMYLASE, BODY FLUID: Amylase, Fluid: 24

## 2011-07-26 LAB — PATHOLOGIST SMEAR REVIEW

## 2011-07-26 LAB — LACTATE DEHYDROGENASE, PLEURAL OR PERITONEAL FLUID: LD, Fluid: 256 — ABNORMAL HIGH

## 2011-07-27 ENCOUNTER — Ambulatory Visit (INDEPENDENT_AMBULATORY_CARE_PROVIDER_SITE_OTHER): Payer: Medicare Other | Admitting: Internal Medicine

## 2011-07-27 ENCOUNTER — Inpatient Hospital Stay (HOSPITAL_COMMUNITY): Payer: Medicare Other

## 2011-07-27 ENCOUNTER — Observation Stay (HOSPITAL_COMMUNITY)
Admission: AD | Admit: 2011-07-27 | Discharge: 2011-07-31 | Disposition: A | Discharge status: Home / Self Care | Payer: Medicare Other | Source: Ambulatory Visit | Attending: Internal Medicine | Admitting: Internal Medicine

## 2011-07-27 ENCOUNTER — Encounter: Payer: Self-pay | Admitting: Internal Medicine

## 2011-07-27 DIAGNOSIS — I10 Essential (primary) hypertension: Secondary | ICD-10-CM | POA: Insufficient documentation

## 2011-07-27 DIAGNOSIS — R079 Chest pain, unspecified: Secondary | ICD-10-CM

## 2011-07-27 DIAGNOSIS — Z79899 Other long term (current) drug therapy: Secondary | ICD-10-CM | POA: Insufficient documentation

## 2011-07-27 DIAGNOSIS — Z23 Encounter for immunization: Secondary | ICD-10-CM

## 2011-07-27 DIAGNOSIS — I5023 Acute on chronic systolic (congestive) heart failure: Secondary | ICD-10-CM | POA: Insufficient documentation

## 2011-07-27 DIAGNOSIS — I4891 Unspecified atrial fibrillation: Secondary | ICD-10-CM

## 2011-07-27 DIAGNOSIS — E785 Hyperlipidemia, unspecified: Secondary | ICD-10-CM | POA: Insufficient documentation

## 2011-07-27 DIAGNOSIS — I509 Heart failure, unspecified: Secondary | ICD-10-CM | POA: Insufficient documentation

## 2011-07-27 DIAGNOSIS — Z7901 Long term (current) use of anticoagulants: Secondary | ICD-10-CM | POA: Insufficient documentation

## 2011-07-27 DIAGNOSIS — E119 Type 2 diabetes mellitus without complications: Secondary | ICD-10-CM | POA: Insufficient documentation

## 2011-07-27 DIAGNOSIS — J189 Pneumonia, unspecified organism: Secondary | ICD-10-CM | POA: Insufficient documentation

## 2011-07-27 LAB — COMPREHENSIVE METABOLIC PANEL
ALT: 23 U/L (ref 0–53)
AST: 24 U/L (ref 0–37)
Albumin: 3.1 g/dL — ABNORMAL LOW (ref 3.5–5.2)
Alkaline Phosphatase: 77 U/L (ref 39–117)
BUN: 19 mg/dL (ref 6–23)
CO2: 25 mEq/L (ref 19–32)
Calcium: 9.1 mg/dL (ref 8.4–10.5)
Chloride: 98 mEq/L (ref 96–112)
Creatinine, Ser: 0.81 mg/dL (ref 0.50–1.35)
GFR calc Af Amer: >90 mL/min (ref >90)
GFR calc non Af Amer: 88 mL/min — ABNORMAL LOW (ref >90)
Glucose, Bld: 106 mg/dL — ABNORMAL HIGH (ref 70–99)
Potassium: 4.4 mEq/L (ref 3.5–5.1)
Sodium: 134 mEq/L — ABNORMAL LOW (ref 135–145)
Total Bilirubin: 1 mg/dL (ref 0.3–1.2)
Total Protein: 7.2 g/dL (ref 6.0–8.3)

## 2011-07-27 LAB — CARDIAC PANEL(CRET KIN+CKTOT+MB+TROPI)
CK, MB: 2.3 ng/mL (ref 0.3–4.0)
CK, MB: 2.6 ng/mL (ref 0.3–4.0)
Relative Index: 1.7 (ref 0.0–2.5)
Relative Index: 1.6 (ref 0.0–2.5)
Total CK: 134 U/L (ref 7–232)
Total CK: 167 U/L (ref 7–232)
Troponin I: <0.3 ng/mL (ref <0.30)
Troponin I: <0.3 ng/mL (ref <0.30)

## 2011-07-27 LAB — CBC
HCT: 40.7 % (ref 39.0–52.0)
Hemoglobin: 13.8 g/dL (ref 13.0–17.0)
MCH: 29.7 pg (ref 26.0–34.0)
MCHC: 33.9 g/dL (ref 30.0–36.0)
MCV: 87.5 fL (ref 78.0–100.0)
Platelets: 210 10*3/uL (ref 150–400)
RBC: 4.65 MIL/uL (ref 4.22–5.81)
RDW: 13.4 % (ref 11.5–15.5)
WBC: 12.9 10*3/uL — ABNORMAL HIGH (ref 4.0–10.5)

## 2011-07-27 LAB — PROTIME-INR
INR: 1.5 — ABNORMAL HIGH (ref 0.00–1.49)
Prothrombin Time: 18.4 seconds — ABNORMAL HIGH (ref 11.6–15.2)

## 2011-07-27 LAB — D-DIMER, QUANTITATIVE: D-Dimer, Quant: 0.96 ug/mL-FEU — ABNORMAL HIGH (ref 0.00–0.48)

## 2011-07-27 LAB — GLUCOSE, CAPILLARY
Glucose-Capillary: 111 mg/dL — ABNORMAL HIGH (ref 70–99)
Glucose-Capillary: 205 mg/dL — ABNORMAL HIGH (ref 70–99)

## 2011-07-27 LAB — DIGOXIN LEVEL: Digoxin Level: 0.9 ng/mL (ref 0.8–2.0)

## 2011-07-27 NOTE — Patient Instructions (Signed)
To [facility] admission office

## 2011-07-27 NOTE — H&P (Signed)
NAME:  Bradley [name], Bradley [name] NO.:  [id number]  MEDICAL RECORD NO.:  [id number]  LOCATION:  3743                         FACILITY:  [facility]  PHYSICIAN:  Gery Pray, MD      DATE OF BIRTH:  December 15, 1940  DATE OF ADMISSION:  07/27/2011 DATE OF DISCHARGE:                             HISTORY & PHYSICAL   PRIMARY CARE PHYSICIAN:  [name], MD.  PULMONOLOGIST:  Name unknown.  CARDIOLOGIST:  Doylene Canning. Ladona Ridgel, MD with [place].  CODE STATUS:  Full code.  The patient goes to team number 5.  CHIEF COMPLAINT:  Chest pain.  HPI:  This is a 70 year old gentleman with multiple medical issues who states he has had bad week.  His son was arrested and going to jail.  He has had to put his house up for bail.  He states that on Sunday night, he went to bed, he could not fall asleep because he kept getting chest pressure, it would wake him up from sleep.  He states he has not had this before.  He also was having episodes of diaphoresis.  The symptoms continued to get worse, he finally went and saw [name].  The states the chest pressure is left-sided and centrally located, no radiation.  He reports no palpitation.  No lower extremity edema.  He does have a history of chronic atrial fibrillation, but no other significant cardiac history.  He reports he does not get lightheaded or dizzy.  He reports no wheezing.  He states the pain is approximately 5/10.  It wakes him up from sleep at night and then self resolves fairly quickly after this, but he is unable to go back to sleep.  He has never had it before.  He denies any history of GERD.  He states that he does have diabetes and hypertension, both of which he states are well controlled.  He has not used nitro.  He states he used to use nitro in the past, however, this was discontinued by PCP. He reports no diarrhea.  No altered mental status.  No fevers.  No chills.  No nausea or vomiting.  No cough.  No abdominal pain.  No burning on  urination. He states he may have some increased anxiety.  During my interview, the patient for the most part, was pursed lip breathing.  He states that this was chronic for him.  This is not new.  This has not changed.  He is not on home oxygen.  He is unclear if he has been evaluated for home oxygen.  Currently the patient is pain free.  History obtained from the patient who is in no acute distress.  The patient went to see his PCP, [name] who called the hospitalist service and they requested direct admission.  REVIEW OF SYSTEMS:  All 10-point systems reviewed and negative except as noted in HPI.  PAST MEDICAL HISTORY:  Includes hypertension, diabetes mellitus, history of CHF, and pulmonary hypertension.  PAST SURGICAL HISTORY:  Includes a right thoracotomy and decortication of the right lower lobe of the lung.  MEDICATIONS: 1. Glyburide 5 mg tablet daily. 2. Glucophage 850 mg p.o. daily. 3. Januvia  100 mg daily. 4. Metoprolol 50 mg p.o. b.i.d. 5. Cozaar 100 mg daily. 6. Lotrel 10/40 one p.o. daily. 7. Lasix 20 mg daily. 8. Digoxin 0.25 mg daily. 9. Advicor 100/20 mg daily. 10.Ambien p.r.n. 11.Aspirin 325 mg daily. 12.Coumadin 5 mg p.o. daily.  ALLERGIES:  No known drug allergies.  SOCIAL HISTORY:  Lives with his wife.  He has not smoked in over 30 years.  No alcohol.  No illicit drugs.  He is not on home oxygen.  He does not use a cane or walker.  FAMILY HISTORY:  Significant for father had coronary artery disease and diabetes and mom with polycythemia.  PHYSICAL EXAMINATION:  VITAL SIGNS:  Pending. GENERAL:  Alert and oriented male, purse-lipped breathing. HEENT:  Eyes, pink conjunctivae, PERRLA.  ENT, moist oral mucosa. Trachea midline. NECK:  Supple.  No thyromegaly. LUNGS:  Very very poor air exchange.  No significant wheezes appreciated.  No excessive use of accessory muscles noted. CARDIOVASCULAR:  Irregular rate and rhythm without murmurs, rubs,  ,or gallops.  No JVD. ABDOMEN:  Obese, soft, positive bowel sounds.  Nontender and nondistended.  Unable to assess organomegaly due to body habitus. NEUROLOGIC:  Cranial nerves II through XII grossly intact.  Sensation intact. MUSCULOSKELETAL:  Strength is 5/5 in all extremities.  No clubbing, cyanosis, or edema. SKIN:  No rashes.  No subcutaneous crepitation.  LABORATORY DATA:  Labs are ordered and are pending.  ASSESSMENT AND PLAN: 1. Chest pain.  The patient will be admitted.  Labs will be ordered     including cardiac enzyme.  The patient will be placed on tele, EKG     will be ordered.  Cardiac enzymes x3 will be cycled.  Lipid panel     will be ordered.  The patient will be placed on aspirin.  We will     resume patient's beta-blocker and ACE inhibitor and order a stress     test for Dr. Lubertha Basque read. 2. Pursed-lip breathing.  The patient does have a history of a large     right hydropneumothorax and for which he had a thoracotomy and     decortication.  The patient states that his breathing as is     baseline for him.  I will go ahead and order chest x-ray.  We will     order oxygen.  We will order some steroids and neb treatments and     once further information is obtained, this may be discontinued or     weaned by a.m. team based on findings.  We will also go ahead and     order a D-dimer.  The patient is on chronic Coumadin therapy for     his atrial fibrillation.  No further intervention at this point. 3. Chronic atrial fibrillation.  The patient on Coumadin.  Pharmacy     will monitor.  We will check a PT/INR.  We will also check a dig     level.  Resume home medications. 4. Diabetes mellitus. 5. Hypertension. 6. Pulmonary hypertension. 7. History of congestive heart failure. 8. Morbid obesity, stable.  Resume home medications.          ______________________________ Gery Pray, MD     DC/MEDQ  D:  07/27/2011  T:  07/27/2011  Job:   [id number]  Electronically Signed by Gery Pray MD on 07/27/2011 08:30:54 PM

## 2011-07-27 NOTE — Progress Notes (Signed)
Subjective:    Patient ID: Bradley [name], male    DOB: May 09, 1941, 70 y.o.   MRN: [id number]  HPI Not feeling well for the last 2 days since he learned that his son was put  in jail. Has been unable to sleep for the last 2 nights Has developed chest pain on and off, located at the distal anterior chest, no radiation, no associated with nausea shortness or breath, he does get clammy, chest pain is triggered by lying down and better when he set up, described as sharp (?). In addition it is coming down with a cold: Dry throat, runny nose. Past Medical History  Diagnosis Date  . HTN (hypertension)   . HLD (hyperlipidemia)   . DM (diabetes mellitus) 1999    adult onset  . Permanent atrial fibrillation     Dr. Ladona Ridgel  . BPH (benign prostatic hyperplasia)   . Leg ulcer 2011  . Chronic diastolic heart failure     Echocardiogram 12/25/10: EF 45-50%; moderate LVH; global hypokinesis; moderate biatrial enlargement; PASP 36;    Cardiac catheterization July 2008: No CAD; moderate pulmonary hypertension  . Pleural effusion 2008    Status post decortication  . Insomnia    Past Surgical History  Procedure Date  . Pleural scarification   . Lung decortication   . Cardiac catheterization   . Colonoscopy 03/10/11    normal   History   Social History  . Marital Status: Married    Spouse Name: N/A    Number of Children: 1  . 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 Smoking status: Former Smoker -- 2.5 packs/day for 25 years    Types: Cigarettes    Quit date: 10/11/1980  . Smokeless tobacco: Never Used  . Alcohol Use: Yes     occas  . Drug Use: No  . Sexually Active: Not on file   Other Topics Concern  . Not on file   Social History Narrative   Moved from [place] 2003He lives w/ his wife      Review of Systems No fever but some chills Minimal if any cough, very mild sputum production Denies abdominal pain per se,  diarrhea, bloating the stools or GERD-type symptoms. He also feels palpitations on and off, symptoms are random and not necessarily related to chest pain. His is still very distressed emotionally    Objective:   Physical Exam  Constitutional: He is oriented to person, place, and time. He appears well-developed and well-nourished.       No physical distress  Cardiovascular:         Irregularly irregular  Pulmonary/Chest: Breath sounds normal. No respiratory distress. He has no wheezes. He has no rales.  Abdominal: Soft. He exhibits no distension. There is no tenderness. There is no rebound and no guarding.  Musculoskeletal: He exhibits no edema.  Neurological: He is alert and oriented to person, place, and time.  Psychiatric:       Mild to moderate emotional distress          Assessment & Plan:  70 year old gentleman with a number of medical problems, on chronic Coumadin who presentes with on and off atypical chest pain, episodes of clamminess in the setting of acute emotional distress and a URI. EKG is at baseline, he has T-wave inversions on the lateral leads that have been present before. I think he needs to be admitted to the  hospital for observation, to be sure his symptoms don't represent an acute heart trouble, symptoms would definitely be due to stress but with the number of comorbidities I won't be able to say that without further testing. Patient in agreement. Will call the hospital service, he sees [facility] cardiology regularly, they may need to be consulted. At the present time he is asymptomatic, declined an ambulance  D/W Dr Haroldine Laws

## 2011-07-28 ENCOUNTER — Inpatient Hospital Stay (HOSPITAL_COMMUNITY): Payer: Medicare Other

## 2011-07-28 LAB — CBC
HCT: 40.3 % (ref 39.0–52.0)
Hemoglobin: 13.5 g/dL (ref 13.0–17.0)
MCH: 29 pg (ref 26.0–34.0)
MCHC: 33.5 g/dL (ref 30.0–36.0)
MCV: 86.7 fL (ref 78.0–100.0)
Platelets: 213 10*3/uL (ref 150–400)
RBC: 4.65 MIL/uL (ref 4.22–5.81)
RDW: 13.2 % (ref 11.5–15.5)
WBC: 7.6 10*3/uL (ref 4.0–10.5)

## 2011-07-28 LAB — BASIC METABOLIC PANEL
BUN: 21 mg/dL (ref 6–23)
CO2: 22 mEq/L (ref 19–32)
Calcium: 9.1 mg/dL (ref 8.4–10.5)
Chloride: 102 mEq/L (ref 96–112)
Creatinine, Ser: 0.69 mg/dL (ref 0.50–1.35)
GFR calc Af Amer: >90 mL/min (ref >90)
GFR calc non Af Amer: >90 mL/min (ref >90)
Glucose, Bld: 195 mg/dL — ABNORMAL HIGH (ref 70–99)
Potassium: 4.1 mEq/L (ref 3.5–5.1)
Sodium: 138 mEq/L (ref 135–145)

## 2011-07-28 LAB — LIPID PANEL
Cholesterol: 131 mg/dL (ref 0–200)
HDL: 37 mg/dL — ABNORMAL LOW (ref >39)
LDL Cholesterol: 80 mg/dL (ref 0–99)
Total CHOL/HDL Ratio: 3.5 RATIO
Triglycerides: 70 mg/dL (ref <150)
VLDL: 14 mg/dL (ref 0–40)

## 2011-07-28 LAB — GLUCOSE, CAPILLARY
Glucose-Capillary: 195 mg/dL — ABNORMAL HIGH (ref 70–99)
Glucose-Capillary: 180 mg/dL — ABNORMAL HIGH (ref 70–99)
Glucose-Capillary: 214 mg/dL — ABNORMAL HIGH (ref 70–99)
Glucose-Capillary: 175 mg/dL — ABNORMAL HIGH (ref 70–99)

## 2011-07-28 LAB — CARDIAC PANEL(CRET KIN+CKTOT+MB+TROPI)
CK, MB: 2.9 ng/mL (ref 0.3–4.0)
Relative Index: INVALID (ref 0.0–2.5)
Total CK: 94 U/L (ref 7–232)
Troponin I: <0.3 ng/mL (ref <0.30)

## 2011-07-28 LAB — PROTIME-INR
INR: 1.72 — ABNORMAL HIGH (ref 0.00–1.49)
Prothrombin Time: 20.5 seconds — ABNORMAL HIGH (ref 11.6–15.2)

## 2011-07-28 MED ORDER — IOHEXOL 300 MG/ML  SOLN
100.0000 mL | Freq: Once | INTRAMUSCULAR | Status: AC | PRN
  Administered 2011-07-28: 100 mL via INTRAVENOUS

## 2011-07-29 ENCOUNTER — Other Ambulatory Visit (HOSPITAL_COMMUNITY): Payer: Medicare Other

## 2011-07-29 DIAGNOSIS — R079 Chest pain, unspecified: Secondary | ICD-10-CM

## 2011-07-29 DIAGNOSIS — I4891 Unspecified atrial fibrillation: Secondary | ICD-10-CM

## 2011-07-29 DIAGNOSIS — R0602 Shortness of breath: Secondary | ICD-10-CM

## 2011-07-29 LAB — PROTIME-INR
INR: 2.46 — ABNORMAL HIGH (ref 0.00–1.49)
Prothrombin Time: 27.1 seconds — ABNORMAL HIGH (ref 11.6–15.2)

## 2011-07-29 LAB — CBC
HCT: 40.3 % (ref 39.0–52.0)
Hemoglobin: 14.5 g/dL (ref 13.0–17.0)
MCH: 31.5 pg (ref 26.0–34.0)
MCHC: 36 g/dL (ref 30.0–36.0)
MCV: 87.6 fL (ref 78.0–100.0)
Platelets: 229 10*3/uL (ref 150–400)
RBC: 4.6 MIL/uL (ref 4.22–5.81)
RDW: 13.2 % (ref 11.5–15.5)
WBC: 9.6 10*3/uL (ref 4.0–10.5)

## 2011-07-29 LAB — GLUCOSE, CAPILLARY
Glucose-Capillary: 247 mg/dL — ABNORMAL HIGH (ref 70–99)
Glucose-Capillary: 166 mg/dL — ABNORMAL HIGH (ref 70–99)
Glucose-Capillary: 209 mg/dL — ABNORMAL HIGH (ref 70–99)
Glucose-Capillary: 153 mg/dL — ABNORMAL HIGH (ref 70–99)

## 2011-07-29 LAB — BASIC METABOLIC PANEL
BUN: 27 mg/dL — ABNORMAL HIGH (ref 6–23)
CO2: 27 mEq/L (ref 19–32)
Calcium: 9.4 mg/dL (ref 8.4–10.5)
Chloride: 103 mEq/L (ref 96–112)
Creatinine, Ser: 0.73 mg/dL (ref 0.50–1.35)
GFR calc Af Amer: >90 mL/min (ref >90)
GFR calc non Af Amer: >90 mL/min (ref >90)
Glucose, Bld: 308 mg/dL — ABNORMAL HIGH (ref 70–99)
Potassium: 5.1 mEq/L (ref 3.5–5.1)
Sodium: 137 mEq/L (ref 135–145)

## 2011-07-29 MED ORDER — TECHNETIUM TC 99M TETROFOSMIN IV KIT
30.0000 | PACK | Freq: Once | INTRAVENOUS | Status: AC | PRN
  Administered 2011-07-29: 30 via INTRAVENOUS

## 2011-07-29 MED ORDER — TECHNETIUM TC 99M TETROFOSMIN IV KIT
10.0000 | PACK | Freq: Once | INTRAVENOUS | Status: AC | PRN
  Administered 2011-07-28: 10 via INTRAVENOUS

## 2011-07-30 ENCOUNTER — Encounter: Payer: Medicare Other | Admitting: *Deleted

## 2011-07-30 LAB — GLUCOSE, CAPILLARY
Glucose-Capillary: 126 mg/dL — ABNORMAL HIGH (ref 70–99)
Glucose-Capillary: 227 mg/dL — ABNORMAL HIGH (ref 70–99)
Glucose-Capillary: 85 mg/dL (ref 70–99)
Glucose-Capillary: 153 mg/dL — ABNORMAL HIGH (ref 70–99)

## 2011-07-30 LAB — CBC
HCT: 40.2 % (ref 39.0–52.0)
Hemoglobin: 14 g/dL (ref 13.0–17.0)
MCH: 31.2 pg (ref 26.0–34.0)
MCHC: 34.8 g/dL (ref 30.0–36.0)
MCV: 89.5 fL (ref 78.0–100.0)
Platelets: 215 10*3/uL (ref 150–400)
RBC: 4.49 MIL/uL (ref 4.22–5.81)
RDW: 13.4 % (ref 11.5–15.5)
WBC: 10.3 10*3/uL (ref 4.0–10.5)

## 2011-07-30 LAB — PROTIME-INR
INR: 3.94 — ABNORMAL HIGH (ref 0.00–1.49)
Prothrombin Time: 39.1 seconds — ABNORMAL HIGH (ref 11.6–15.2)

## 2011-07-30 LAB — BASIC METABOLIC PANEL
BUN: 19 mg/dL (ref 6–23)
CO2: 31 mEq/L (ref 19–32)
Calcium: 8.7 mg/dL (ref 8.4–10.5)
Chloride: 104 mEq/L (ref 96–112)
Creatinine, Ser: 0.67 mg/dL (ref 0.50–1.35)
GFR calc Af Amer: >90 mL/min (ref >90)
GFR calc non Af Amer: >90 mL/min (ref >90)
Glucose, Bld: 129 mg/dL — ABNORMAL HIGH (ref 70–99)
Potassium: 4.3 mEq/L (ref 3.5–5.1)
Sodium: 141 mEq/L (ref 135–145)

## 2011-07-31 ENCOUNTER — Inpatient Hospital Stay (HOSPITAL_COMMUNITY): Payer: Medicare Other

## 2011-07-31 LAB — CBC
HCT: 40.9 % (ref 39.0–52.0)
Hemoglobin: 14.1 g/dL (ref 13.0–17.0)
MCH: 31 pg (ref 26.0–34.0)
MCHC: 34.5 g/dL (ref 30.0–36.0)
MCV: 89.9 fL (ref 78.0–100.0)
Platelets: 229 10*3/uL (ref 150–400)
RBC: 4.55 MIL/uL (ref 4.22–5.81)
RDW: 13.4 % (ref 11.5–15.5)
WBC: 8.8 10*3/uL (ref 4.0–10.5)

## 2011-07-31 LAB — BASIC METABOLIC PANEL
BUN: 12 mg/dL (ref 6–23)
CO2: 31 mEq/L (ref 19–32)
Calcium: 8.7 mg/dL (ref 8.4–10.5)
Chloride: 101 mEq/L (ref 96–112)
Creatinine, Ser: 0.65 mg/dL (ref 0.50–1.35)
GFR calc Af Amer: >90 mL/min (ref >90)
GFR calc non Af Amer: >90 mL/min (ref >90)
Glucose, Bld: 127 mg/dL — ABNORMAL HIGH (ref 70–99)
Potassium: 4.2 mEq/L (ref 3.5–5.1)
Sodium: 141 mEq/L (ref 135–145)

## 2011-07-31 LAB — HOMOCYSTEINE: Homocysteine: 9.5 umol/L (ref 4.0–15.4)

## 2011-07-31 LAB — GLUCOSE, CAPILLARY
Glucose-Capillary: 139 mg/dL — ABNORMAL HIGH (ref 70–99)
Glucose-Capillary: 152 mg/dL — ABNORMAL HIGH (ref 70–99)

## 2011-07-31 LAB — PROTIME-INR
INR: 2.9 — ABNORMAL HIGH (ref 0.00–1.49)
Prothrombin Time: 30.8 seconds — ABNORMAL HIGH (ref 11.6–15.2)

## 2011-08-01 NOTE — Discharge Summary (Signed)
NAME:  Bradley [name], Bradley [name] NO.:  [id number]  MEDICAL RECORD NO.:  [id number]  LOCATION:  3743                         FACILITY:  [facility]  PHYSICIAN:  Manson Passey, MD        DATE OF BIRTH:  1941-09-10  DATE OF ADMISSION:  07/27/2011 DATE OF DISCHARGE:  07/31/2011                              DISCHARGE SUMMARY   PRIMARY CARE PHYSICIAN:  [name], MD  PRIMARY PULMONOLOGIST:  The patient does not recall the name.  PRIMARY CARDIOLOGIST:  Doylene Canning. Ladona Ridgel, MD with [facility].  CODE STATUS:  Full code.  DISCHARGE DIAGNOSES: 1. Atrial fibrillation with rapid ventricular rate. 2. Acute on chronic systolic congestive heart failure, ejection     fraction 45-50% in March 2012. 3. Diabetes. 4. Hypertension. 5. Dyslipidemia.  DISCHARGE MEDICATIONS: 1. Diltiazem 60 mg every 8 hours. 2. Hydrocodone/APAP 5/325 mg tablet, 1-2 tablets every 4 hours as     needed. 3. Linagliptin 5 mg daily. 4. Avelox 400 mg daily by mouth for 2 more weeks after discharge, stop     date August 14, 2011. 5. Protonix 40 mg daily. 6. Coumadin 2 mg daily.  Of note, please recheck INR Monday August 02, 2011. 7. Lasix 40 mg twice daily by mouth. 8. Metoprolol 75 mg twice daily. 9. Advicor 1000/20 mg 1 tablet daily. 10.Ambien 10 mg daily at bedtime as needed. 11.Aspirin 325 mg daily. 12.Cozaar 100 mg daily. 13.Digoxin 0.25 mg daily. 14.Glucophage 850 mg twice daily. 15.Glyburide 5 mg twice daily. 16.Januvia 100 mg daily. 17.Multivitamin daily.  CONSULTATIONS:  Cardiology.  DIAGNOSTIC STUDIES: 1. July 29, 2011, stress test which shows small area of     reversibility in the distal anterolateral wall of the left     ventricle suspicious for myocardial ischemia.  Septal hypokinesis     with calculated left ventricular ejection fraction of 48%. 2. July 28, 2011, CT angio chest for PE protocol.  There is no     large central pulmonary embolus.  Patchy bilateral airspace disease  compatible with multifocal pneumonia.  July 28, 2011, x-ray     chest shows patchy bilateral nodular infiltrates suspicious for     multifocal pneumonia.  DISCHARGE LABORATORY:  INR 2.9.  Sodium 141, potassium 4.2, chloride 101, bicarb 31, BUN 12, creatinine 0.625, glucose 127, calcium 8.7. Antithrombin III enzyme 99 (within normal limits.)  White blood cells 8.8, hemoglobin 14, hematocrit 41, platelet count 229,000.  Blood cultures from July 27, 2011 shows no growth to date.  Two sets of cardiac enzymes negative.  Cholesterol 131, triglycerides 70, HDL 37, LDL 80.  HISTORY OF PRESENT ILLNESS:  The patient is a 70 year old male with known history of atrial fibrillation on Coumadin, chronic diastolic heart failure, diabetes, hypertension, dyslipidemia, who presents to emergency room with complaints of increasing shortness of breath, chest pain, and palpitations.  The patient reports his shortness of breath being associated for cough productive of whitish sputum.  The patient does report being under a lot of stress recently, so attributes anxiety to some of these symptoms.  He does report subjective fevers at home. The patient was admitted to Hospitalist Service for  further evaluation and management.  PHYSICAL EXAMINATION:  VITAL SIGNS:  Blood pressure 153/75, pulse 76, respirations 16, temperature 97.6 Fahrenheit, oxygen saturation 96% on 2 L nasal cannula. GENERAL:  No acute distress, the patient appears comfortable. SKIN:  Warm, dry. NECK:  Supple.  No lymphadenopathy, no carotid bruits. LUNGS:  Clear to auscultation bilaterally, no wheezing, no rales, no rhonchi. CARDIOVASCULAR:  Positive S1, S2, irregular rhythm, rate controlled. ABDOMEN:  Positive bowel sounds, nontender/nondistended, soft. EXTREMITIES:  Pulses palpable bilaterally, no lower extremity edema. NEUROLOGICAL:  Alert, awake, oriented x3, no focal neurologic deficits.  HOSPITAL COURSE BY PROBLEM: 1. Atrial  fibrillation with rapid ventricular rate.  Cardiology was     consulted and recommended starting the patient on Cardizem 60 mg     every 8 hours for rate control in addition to the patient's beta-     blocker which is metoprolol 75 mg twice a day.  While in the     hospital, the patient had stress test, but as per Cardiology, it is     a very low risk stress test and no additional workup is warranted     at present.  The patient can be discharged home and follow up with     the primary cardiologist to reassess the need for any other     additional medications for controlling the heart rate.  The patient     is also on anticoagulation Coumadin.  INR on day of discharge is     2.9.  The patient can go home with 2 mg Coumadin at bedtime daily     but the patient needs to follow up with primary care physician to     recheck INR by Monday, August 02, 2011. 2. Multifocal pneumonia.  The patient was on IV antibiotics in     hospital, Avelox and will continue taking Avelox by mouth 400 mg     daily upon discharge for 2 more weeks with the stop date August 14, 2011. 3. Acute on chronic diastolic heart failure.  The patient will be     discharged home with Lasix 40 mg twice daily by mouth and will     continue Cozaar 100 mg daily as well as digoxin 0.25 mg daily. 4. Diabetes.  The patient can continue taking linagliptin, Glucophage,     glyburide, and Januvia as his home medication regimen for diabetes. 5. History of dyslipidemia.  Please continue Advicor for cholesterol.  CONDITION ON DISCHARGE:  The patient is medically stable and appears clinically well to be discharged home today, July 31, 2011.  TIME SPENT DISCHARGING THE PATIENT:  More than 35 minutes.          ______________________________ Manson Passey, MD     AD/MEDQ  D:  07/31/2011  T:  07/31/2011  Job:  [id number]  Electronically Signed by Manson Passey MD on 08/01/2011 04:13:39 PM

## 2011-08-02 ENCOUNTER — Encounter: Payer: Self-pay | Admitting: Internal Medicine

## 2011-08-02 ENCOUNTER — Ambulatory Visit (INDEPENDENT_AMBULATORY_CARE_PROVIDER_SITE_OTHER): Payer: Medicare Other | Admitting: Internal Medicine

## 2011-08-02 VITALS — BP 120/74 | HR 63 | Temp 98.1°F | Resp 18 | Wt 212.0 lb

## 2011-08-02 DIAGNOSIS — I10 Essential (primary) hypertension: Secondary | ICD-10-CM

## 2011-08-02 DIAGNOSIS — J189 Pneumonia, unspecified organism: Secondary | ICD-10-CM

## 2011-08-02 DIAGNOSIS — I5032 Chronic diastolic (congestive) heart failure: Secondary | ICD-10-CM

## 2011-08-02 DIAGNOSIS — E119 Type 2 diabetes mellitus without complications: Secondary | ICD-10-CM

## 2011-08-02 DIAGNOSIS — I4891 Unspecified atrial fibrillation: Secondary | ICD-10-CM

## 2011-08-02 HISTORY — DX: Pneumonia, unspecified organism: J18.9

## 2011-08-02 LAB — LUPUS ANTICOAGULANT PANEL
DRVVT: 73.5 secs — ABNORMAL HIGH (ref 34.1–42.2)
Lupus Anticoagulant: DETECTED — AB
PTT Lupus Anticoagulant: 94.3 secs — ABNORMAL HIGH (ref 28.0–43.0)
PTTLA 4:1 Mix: 64.9 secs — ABNORMAL HIGH (ref 28.0–43.0)
PTTLA Confirmation: 17.4 secs — ABNORMAL HIGH (ref <8.0)
dRVVT Incubated 1:1 Mix: 39.8 secs (ref 34.1–42.2)

## 2011-08-02 LAB — BETA-2-GLYCOPROTEIN I ABS, IGG/M/A
Beta-2 Glyco I IgG: 1 G Units (ref <20)
Beta-2-Glycoprotein I IgA: 18 A Units (ref <20)
Beta-2-Glycoprotein I IgM: 10 M Units (ref <20)

## 2011-08-02 LAB — PROTEIN C ACTIVITY: Protein C Activity: <15 % (ref 75–133)

## 2011-08-02 LAB — CARDIOLIPIN ANTIBODIES, IGG, IGM, IGA
Anticardiolipin IgA: 17 APL U/mL (ref <22)
Anticardiolipin IgG: 8 GPL U/mL — ABNORMAL LOW (ref <23)
Anticardiolipin IgM: 11 MPL U/mL — ABNORMAL HIGH (ref <11)

## 2011-08-02 LAB — ANTITHROMBIN III: AntiThromb III Func: 109 % (ref 76–126)

## 2011-08-02 LAB — PROTEIN S ACTIVITY: Protein S Activity: 21 % — ABNORMAL LOW (ref 69–129)

## 2011-08-02 NOTE — Assessment & Plan Note (Signed)
Recently admitted to the hospital, stress test negative

## 2011-08-02 NOTE — Assessment & Plan Note (Signed)
Stable per last hemoglobin A1c

## 2011-08-02 NOTE — Patient Instructions (Signed)
Please review the medication list currently. See the Coumadin clinic within a week Call if any problems Followup with me in 2 months

## 2011-08-02 NOTE — Assessment & Plan Note (Signed)
Recently his medication was adjusted when he was in the hospital: Lotrel discontinued Metoprolol increased from 50-75 mg twice a day Cardizem was started

## 2011-08-02 NOTE — Progress Notes (Addendum)
  Subjective:    Patient ID: Bradley [name], male    DOB: 1940/12/23, 70 y.o.   MRN: [id number]  HPI Patient was admitted to the hospital for a few days recently due to to feeling clammy, having chest pain and having URI type of symptoms. Cardiology evaluated him, negative stress test, they add calcium channel blockers to control better the heart rate. He was also diagnosed with pneumonia, currently on Avelox. He is here for followup. All labs and x-rays reviewed. Several medications were changed, see assessment and plan.  Past Medical History  Diagnosis Date  . HTN (hypertension)     at some point hard to control- saw Dr Lowell Guitar  . HLD (hyperlipidemia)   . DM (diabetes mellitus) 1999    adult onset  . Permanent atrial fibrillation     Dr. Ladona Ridgel  . BPH (benign prostatic hyperplasia)     saw Dr [name] 2004, normal renal u/s  . Leg ulcer 2011  . Chronic diastolic heart failure     Echocardiogram 12/25/10: EF 45-50%; moderate LVH; global hypokinesis; moderate biatrial enlargement; PASP 36;    Cardiac catheterization July 2008: No CAD; moderate pulmonary hypertension  . Pleural effusion 2008    Status post decortication  . Insomnia    Past Surgical History  Procedure Date  . Pleural scarification   . Lung decortication   . Cardiac catheterization   . Colonoscopy 03/10/11    normal  . Pneumothorax with fibrothorax      Review of Systems He is feeling much better Clamminess is resolved Denies any chest pain or shortness of breath No fever or chills No palpitations Appetite is normal     Objective:   Physical Exam  Constitutional: He is oriented to person, place, and time. He appears well-developed and well-nourished.  Cardiovascular:       Irregular  Musculoskeletal: He exhibits no edema.  Neurological: He is alert and oriented to person, place, and time.          Assessment & Plan:  Today , I spent more than 25 min with the patient, >50% of the time counseling  and  trying to figure out his new medication list. --Coumadin was changed from 5 mg daily to 2 mg daily, last INR 2.9. The patient likes to go back to his routine Coumadin dose,  I think that is reasonable. Recommend to see his Coumadin doctor within a week --diltiazem was added to his regimen --Lopressor was increased to 1.5 tablet twice a day --The patient was discharged on Januvia and also tradjenta: d/c tradjenta --Lasix was increased from 20 mg twice a day to 40 mg twice a day --Lotrel was discontinued  Medication carefuly  discussed with the patient

## 2011-08-02 NOTE — Assessment & Plan Note (Signed)
Recently his medication was adjusted when he was in the hospital: Lasix dose increased from 20 to 40 twice a day Lotrel discontinued Metoprolol increased from 50-75 mg twice a day Cardizem was started

## 2011-08-02 NOTE — Assessment & Plan Note (Signed)
Diagnosed few  days ago, feeling much better

## 2011-08-03 LAB — CULTURE, BLOOD (ROUTINE X 2)
Culture  Setup Time: 201210170334
Culture  Setup Time: 201210170334
Culture: NO GROWTH
Culture: NO GROWTH

## 2011-08-03 LAB — PROTEIN S, TOTAL: Protein S Ag, Total: 68 % (ref 60–150)

## 2011-08-03 LAB — PROTEIN C, TOTAL: Protein C, Total: 53 % — ABNORMAL LOW (ref 72–160)

## 2011-08-04 LAB — FACTOR 5 LEIDEN

## 2011-08-05 LAB — PROTHROMBIN GENE MUTATION

## 2011-08-08 NOTE — Consult Note (Signed)
NAME:  Bradley [name], Bradley [name] NO.:  [id number]  MEDICAL RECORD NO.:  [id number]  LOCATION:  3743                         FACILITY:  [facility]  PHYSICIAN:  Doylene Canning. Ladona Ridgel, MD    DATE OF BIRTH:  08/19/1941  DATE OF CONSULTATION:  07/29/2011 DATE OF DISCHARGE:                                CONSULTATION   The consultation is requested by the [facility] Service.  INDICATION FOR CONSULTATION:  Evaluation of chest pain and shortness of breath in the setting of atrial fibrillation with a rapid ventricular response.  HISTORY OF PRESENT ILLNESS:  The patient is a 70 year old man who is well known to me.  He has chronic atrial fibrillation and diastolic heart failure.  Normally, he has class 2 symptoms.  The patient was in his usual state of health when he was admitted to the hospital with increasing shortness of breath, chest pain, and palpitations.  He also had a cough productive for productive sputum.  He notes that he has been under increasing stress.  His son was arrested and will be going to jail.  His wife broke her arm, and the patient has noted increasing cough and dyspnea for the last week.  His cough is productive for white sputum.  He has had subjective fevers, though none obviously documented. When he presented to the hospital, he was having increasing dyspnea along with his cough and shortness of breath, and admitted for additional evaluation.  He ruled out for MI.  Chest x-ray demonstrated suggestive of multi lobar pneumonia.  He has been placed on antibiotic therapy.  His atrial fibrillation has been increased, had an increased ventricular rate.  With AV nodal blocking drugs, the patient's dyspnea has improved.  The patient is overall better, but still not back to normal.  He notes continued palpitations, nausea, and improving subjective fevers.  He has had no syncope.  He denies peripheral edema. CT scan of the chest demonstrated no acute pulmonary emboli  or dissection.  SOCIAL HISTORY:  The patient is married.  He has a history of tobacco use, but he has not smoked in over 30 years.  He denies alcohol or illicit drug use.  FAMILY HISTORY:  Notable for father with coronary artery disease and diabetes.  REVIEW OF SYSTEMS:  All systems are reviewed and negative except as noted in the HPI.  PAST MEDICAL HISTORY:  Notable for chronic hypertension, chronic diabetes, chronic CHF, and chronic pulmonary hypertension.  PHYSICAL EXAMINATION:  GENERAL:  He is a pleasant 70 year old man in no distress. VITAL SIGNS:  Blood pressure was 128/77, the pulse was 66 and irregular, respirations were 20, temperature was 97.4. HEENT:  Normocephalic and atraumatic.  Pupils equal and round. Oropharynx moist.  Sclerae anicteric. NECK:  Revealed 6-7 cm jugular venous distention.  There is no thyromegaly.  Trachea is midline.  The carotids are 2+ and symmetric. CARDIOVASCULAR:  Irregular rhythm with normal S1 and S2.  I could not appreciate any murmurs, rubs, or gallops. ABDOMEN:  Obese, nontender, nondistended.  There is no organomegaly. Bowel sounds present.  No rebound or guarding. EXTREMITIES:  Demonstrated trace peripheral edema bilaterally.  There is no cyanosis or  clubbing.  He did have chronic venous insufficiency changes. NEUROLOGIC:  Alert and oriented x3.  His cranial nerves are intact. Strength is 5/5 and symmetric.  The EKG demonstrates atrial fibrillation with a controlled ventricular response.  Labs have been reviewed and unremarkable except for a potassium of 5.1 and a glucose of 300.  His white count and hemoglobin are normal.  EKG demonstrates atrial fibrillation with a controlled ventricular response, previously rapid ventricular response.  IMPRESSION: 1. Pleuritic chest pain likely due to pneumonia.  Stress scan is     pending. 2. Increasing dyspnea likely secondary to pneumonia and rapid atrial     fibrillation. 3.  Obesity. 4. Diabetes.  DISCUSSION:  For now, we would consider increasing the patient's Lasix to 40 mg IV daily and then switching of 40 mg p.o. a day.  Continue to treat his pneumonia/bronchitis with antibiotic therapy.  Continue rate control with digoxin and beta-blockers.  If his ventricular rate were to increase, then a calcium channel blocker would be warranted.  We will follow up the results of stress test.     Doylene Canning. Ladona Ridgel, MD     GWT/MEDQ  D:  07/29/2011  T:  07/30/2011  Job:  [id number]  Electronically Signed by Lewayne Bunting MD on 08/08/2011 12:20:33 PM

## 2011-08-09 NOTE — Progress Notes (Deleted)
  Subjective:    Patient ID: Bradley [name], male    DOB: [birth year], 70 y.o.   MRN: [id number]  HPI    Review of Systems     Objective:   Physical Exam        Assessment & Plan:

## 2011-08-09 NOTE — Progress Notes (Deleted)
  Subjective:    Patient ID: Bradley [name], male    DOB: 06/16/1941, 70 y.o.   MRN: [id number]  HPI    Review of Systems     Objective:   Physical Exam        Assessment & Plan:   

## 2011-08-10 ENCOUNTER — Ambulatory Visit (INDEPENDENT_AMBULATORY_CARE_PROVIDER_SITE_OTHER): Payer: Medicare Other | Admitting: *Deleted

## 2011-08-10 ENCOUNTER — Ambulatory Visit (INDEPENDENT_AMBULATORY_CARE_PROVIDER_SITE_OTHER): Payer: Medicare Other | Admitting: Internal Medicine

## 2011-08-10 ENCOUNTER — Encounter: Payer: Self-pay | Admitting: Internal Medicine

## 2011-08-10 DIAGNOSIS — I4891 Unspecified atrial fibrillation: Secondary | ICD-10-CM

## 2011-08-10 DIAGNOSIS — I509 Heart failure, unspecified: Secondary | ICD-10-CM

## 2011-08-10 DIAGNOSIS — I5032 Chronic diastolic (congestive) heart failure: Secondary | ICD-10-CM

## 2011-08-10 DIAGNOSIS — Z7901 Long term (current) use of anticoagulants: Secondary | ICD-10-CM

## 2011-08-10 LAB — POCT INR: INR: 3.4

## 2011-08-10 MED ORDER — DILTIAZEM HCL ER COATED BEADS 180 MG PO CP24: 180.0000 mg | ORAL_CAPSULE | Freq: Every day | ORAL | Status: DC

## 2011-08-10 NOTE — Patient Instructions (Signed)
Your physician wants you to follow-up in: 6 months with Dr Taylor You will receive a reminder letter in the mail two months in advance. If you don't receive a letter, please call our office to schedule the follow-up appointment.  

## 2011-08-10 NOTE — Assessment & Plan Note (Signed)
His ventricular rate now appears to be well-controlled. He will continue his current medical therapy. Today I asked that he change his Cardizem to long-acting 180 mg one tablet daily from 60 mg 3 times a day.

## 2011-08-10 NOTE — Progress Notes (Signed)
HPI [name] returns today for followup. He is a long-standing history of chronic atrial fibrillation and symptomatic tachycardia. He also has chronic diastolic heart failure. The patient was recently hospitalized with atrial fibrillation and a rapid ventricular response. He was treated with up titration of his medical therapy and diuretics. He is improved since then. He returns today for followup. He denies chest pain, shortness of breath, or peripheral edema. He is working to try to lose weight. He does experience some dyspnea with exertion. Allergies  Allergen Reactions  . Tadalafil     REACTION: HA, backache     Current Outpatient Prescriptions  Medication Sig Dispense Refill  . albuterol (PROVENTIL HFA;VENTOLIN HFA) 108 (90 BASE) MCG/ACT inhaler Inhale 2 puffs into the lungs every 6 (six) hours as needed for wheezing.  1 Inhaler  0  . aspirin 325 MG tablet Take 325 mg by mouth daily.        Marland Kitchen BAYER CONTOUR TEST test strip as directed.      . clobetasol (TEMOVATE) 0.05 % cream Apply topically as directed.        Marland Kitchen dextromethorphan-guaiFENesin (MUCINEX DM) 30-600 MG per 12 hr tablet Take 1 tablet by mouth as needed.        . digoxin (LANOXIN) 0.25 MG tablet Take 1 tablet (250 mcg total) by mouth daily.  90 tablet  3  . furosemide (LASIX) 40 MG tablet Take 40 mg by mouth daily.       Marland Kitchen glyBURIDE (DIABETA) 5 MG tablet Take 4 tablets (20 mg total) by mouth daily.  360 tablet  3  . HYDROcodone-acetaminophen (NORCO) 5-325 MG per tablet Take 1 tablet by mouth every 4 (four) hours as needed.        Marland Kitchen losartan (COZAAR) 100 MG tablet Take 1 tablet (100 mg total) by mouth daily.  90 tablet  3  . metFORMIN (GLUCOPHAGE) 850 MG tablet Take 1 tablet (850 mg total) by mouth 2 (two) times daily with a meal.  180 tablet  3  . metoprolol (LOPRESSOR) 50 MG tablet Take 75 mg by mouth 2 (two) times daily.        Marland Kitchen moxifloxacin (AVELOX) 400 MG tablet Take 400 mg by mouth daily.        . Multiple Vitamin  (MULTIVITAMIN) tablet Take 1 tablet by mouth daily.        . niacin-lovastatin (ADVICOR) 1000-20 MG 24 hr tablet Take 1 tablet by mouth daily.  90 tablet  3  . pantoprazole (PROTONIX) 40 MG tablet Take 40 mg by mouth daily.        . sitaGLIPtin (JANUVIA) 100 MG tablet Take 1 tablet (100 mg total) by mouth daily.  90 tablet  3  . warfarin (COUMADIN) 5 MG tablet Take 5 mg by mouth daily.        Marland Kitchen zolpidem (AMBIEN) 10 MG tablet Take 10 mg by mouth at bedtime as needed.        . zoster vaccine live, PF, (ZOSTAVAX) 16109 UNT/0.65ML injection Inject 19,400 Units into the skin once.  1 vial  0  . diltiazem (CARDIZEM CD) 180 MG 24 hr capsule Take 1 capsule (180 mg total) by mouth daily.  90 capsule  3   Current Facility-Administered Medications  Medication Dose Route Frequency Provider Last Rate Last Dose  . 0.9 %  sodium chloride infusion  500 mL Intravenous Continuous Iva Boop, MD         Past Medical History  Diagnosis Date  .  HTN (hypertension)     at some point hard to control- saw Dr Lowell Guitar  . HLD (hyperlipidemia)   . DM (diabetes mellitus) 1999    adult onset  . Permanent atrial fibrillation     Dr. Ladona Ridgel  . BPH (benign prostatic hyperplasia)     saw Dr [name] 2004, normal renal u/s  . Leg ulcer 2011  . Chronic diastolic heart failure     Echocardiogram 12/25/10: EF 45-50%; moderate LVH; global hypokinesis; moderate biatrial enlargement; PASP 36;    Cardiac catheterization July 2008: No CAD; moderate pulmonary hypertension  . Pleural effusion 2008    Status post decortication  . Insomnia     ROS:   All systems reviewed and negative except as noted in the HPI.   Past Surgical History  Procedure Date  . Pleural scarification   . Lung decortication   . Cardiac catheterization   . Colonoscopy 03/10/11    normal  . Pneumothorax with fibrothorax      Family History  Problem Relation Age of Onset  . Diabetes Father   . Breast cancer Maternal Aunt   . Heart  attack Neg Hx   . Prostate cancer Neg Hx   . Colon cancer Neg Hx   . Cancer Neg Hx     colon or prostate     History   Social History  . Marital Status: Married    Spouse Name: [place]    Number of Children: 1  . 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 Smoking status: Former Smoker -- 2.5 packs/day for 25 years    Types: Cigarettes    Quit date: 10/11/1980  . Smokeless tobacco: Never Used  . Alcohol Use: Yes     occas  . Drug Use: No  . Sexually Active: Not on file   Other Topics Concern  . Not on file   Social History Narrative   Moved from [place] 2003He lives w/ his wife      BP 112/60  Pulse 62  Ht 5\' 8"  (1.727 m)  Wt 215 lb 6.4 oz (97.705 kg)  BMI 32.75 kg/m2  Physical Exam:  Well appearing 70 year old man, NAD HEENT: Unremarkable Neck:  No JVD, no thyromegally Lymphatics:  No adenopathy Back:  No CVA tenderness Lungs:  Clear with no wheezes, rales, or rhonchi. HEART:  IRegular rate rhythm, no murmurs, no rubs, no clicks Abd:  Obese, soft, positive bowel sounds, no organomegally, no rebound, no guarding Ext:  2 plus pulses, no edema, no cyanosis, no clubbing Skin:  No rashes no nodules Neuro:  CN II through XII intact, motor grossly intact  EKG Atrial flutter ablation with a controlled ventricular response.   Assess/Plan:

## 2011-08-10 NOTE — Assessment & Plan Note (Signed)
His symptoms are much improved. But his ventricular rate is controlled his symptoms are class 1-2. Unfortunately he tends to have a rapid ventricular response and when so his symptoms worsened class III. He will continue his current medical therapy which appears to have controlled his ventricular rate very nicely. He is instructed to maintain a low sodium diet as well.

## 2011-08-13 ENCOUNTER — Other Ambulatory Visit: Payer: Self-pay | Admitting: Internal Medicine

## 2011-08-13 MED ORDER — PANTOPRAZOLE SODIUM 40 MG PO TBEC: 40.0000 mg | DELAYED_RELEASE_TABLET | Freq: Every day | ORAL | Status: DC

## 2011-08-13 NOTE — Telephone Encounter (Signed)
Done

## 2011-08-16 ENCOUNTER — Ambulatory Visit (INDEPENDENT_AMBULATORY_CARE_PROVIDER_SITE_OTHER)
Admission: RE | Admit: 2011-08-16 | Discharge: 2011-08-16 | Disposition: A | Discharge status: Home / Self Care | Payer: Medicare Other | Source: Ambulatory Visit | Attending: Internal Medicine | Admitting: Internal Medicine

## 2011-08-16 ENCOUNTER — Encounter: Payer: Self-pay | Admitting: Internal Medicine

## 2011-08-16 ENCOUNTER — Ambulatory Visit (INDEPENDENT_AMBULATORY_CARE_PROVIDER_SITE_OTHER): Payer: Medicare Other | Admitting: Internal Medicine

## 2011-08-16 ENCOUNTER — Ambulatory Visit: Payer: Medicare Other | Admitting: Internal Medicine

## 2011-08-16 VITALS — BP 122/80 | HR 60 | Temp 98.3°F | Resp 18 | Wt 214.0 lb

## 2011-08-16 DIAGNOSIS — J189 Pneumonia, unspecified organism: Secondary | ICD-10-CM

## 2011-08-16 MED ORDER — MOXIFLOXACIN HCL 400 MG PO TABS: 400.0000 mg | ORAL_TABLET | Freq: Every day | ORAL | Status: DC

## 2011-08-16 NOTE — Patient Instructions (Signed)
Et the XR done @ [facility]  Rest, fluids , tylenol For cough, take Mucinex DM twice a day as needed  Call if no better in few days Call anytime if the symptoms are severe, you have high fever, short of breath

## 2011-08-16 NOTE — Assessment & Plan Note (Addendum)
See HPI, was feeling better until 2 days ago when he developed cough Likely a intercurrent illness Plan: symptomatic treatment, CXR, call if sx increase or no better in few days   ADDENDUM: Chest x-ray report is reviewed :  Interval worsening ventilation at the lung bases with reticulonodular density which could be either infection or interstitial edema. Meanwhile left upper lobe perihilar patchy opacity has regressed. Assessment : It is hard to say if x-ray findings are definitely related to edema or infection, his weight however is only 2 pounds more than at the last office visit. Plan: Avelox for an additional week (see tamoxifen doxycycline may interfere with digoxin) Increase Lasix temporarily to 40 mg twice a day RTC 10 days, sooner if worse Coumadin check in 4 days, will communicate with the Coumadin clinic. Patient and wife verbalized understanding

## 2011-08-16 NOTE — Progress Notes (Signed)
  Subjective:    Patient ID: Bradley [name], male    DOB: [birth year], 70 y.o.   MRN: [id number]  HPI Was feeling very well up until 2 days ago when he developed runny nose, cough and sweats. He is concerned because he is just recovering from pneumonia, status post Avelox.  Past Medical History  Diagnosis Date  . HTN (hypertension)     at some point hard to control- saw Dr Lowell Guitar  . HLD (hyperlipidemia)   . DM (diabetes mellitus) 1999    adult onset  . Permanent atrial fibrillation     Dr. Ladona Ridgel  . BPH (benign prostatic hyperplasia)     saw Dr [name] 2004, normal renal u/s  . Leg ulcer 2011  . Chronic diastolic heart failure     Echocardiogram 12/25/10: EF 45-50%; moderate LVH; global hypokinesis; moderate biatrial enlargement; PASP 36;    Cardiac catheterization July 2008: No CAD; moderate pulmonary hypertension  . Pleural effusion 2008    Status post decortication  . Insomnia    Past Surgical History  Procedure Date  . Pleural scarification   . Lung decortication   . Cardiac catheterization   . Colonoscopy 03/10/11    normal  . Pneumothorax with fibrothorax     Review of Systems No fever or chills No chest pain Cough as per surface, some yellow sputum. He is a slightly tired and achy. No sinus congestion.     Objective:   Physical Exam  Constitutional: He is oriented to person, place, and time. He appears well-developed. No distress.  HENT:  Head: Normocephalic and atraumatic.  Right Ear: External ear normal.  Left Ear: External ear normal.  Mouth/Throat: No oropharyngeal exudate.       Nose is slightly congested  Cardiovascular:       Irregular  Pulmonary/Chest: Effort normal and breath sounds normal. He has no rales.       Scattered, mild end expiratory wheezing?Marland Kitchen No increased work of breathing  Neurological: He is alert and oriented to person, place, and time.  Skin: He is not diaphoretic.  Psychiatric: He has a normal mood and affect. His behavior is  normal. Judgment and thought content normal.          Assessment & Plan:

## 2011-08-17 ENCOUNTER — Telehealth: Payer: Self-pay

## 2011-08-17 NOTE — Telephone Encounter (Signed)
Pt called to get clarification on pharmacy that protonix was sent to.  Spoke to pt's wife to advise that protonix was sent to [facility]

## 2011-08-20 ENCOUNTER — Ambulatory Visit (INDEPENDENT_AMBULATORY_CARE_PROVIDER_SITE_OTHER): Payer: Medicare Other | Admitting: *Deleted

## 2011-08-20 ENCOUNTER — Telehealth: Payer: Self-pay | Admitting: Internal Medicine

## 2011-08-20 DIAGNOSIS — Z7901 Long term (current) use of anticoagulants: Secondary | ICD-10-CM

## 2011-08-20 DIAGNOSIS — I4891 Unspecified atrial fibrillation: Secondary | ICD-10-CM

## 2011-08-20 LAB — POCT INR: INR: 3.2

## 2011-08-20 NOTE — Telephone Encounter (Signed)
If severe symptoms or fever, chills, shortness of breath, lower extremity edema: Go to the ER during the weekend. Otherwise followup with me next week

## 2011-08-20 NOTE — Telephone Encounter (Signed)
Pt states that he still feels really fatigued and nauseous. He has no cough or fever and is still taking mucinex and abx.  He also wants MD to know that he is going to coumadin clinic this afternoon

## 2011-08-20 NOTE — Telephone Encounter (Signed)
Please check on the patient, was recently prescribed antibiotics for pneumonia.  better?

## 2011-08-23 NOTE — Telephone Encounter (Signed)
Pt states that he feels like he is wheezing a little and would like to see MD this week. Appointment scheduled for pt to see MD Tuesday, Aug 24, 2011.  Pt advised to go to ED if severe sxs or fever, chills, SOB, or lower extremity edema. Pt verbalized understanding

## 2011-08-23 NOTE — Telephone Encounter (Signed)
Pt sleeping. Wife will have him call back later

## 2011-08-24 ENCOUNTER — Ambulatory Visit (INDEPENDENT_AMBULATORY_CARE_PROVIDER_SITE_OTHER): Payer: Medicare Other | Admitting: Internal Medicine

## 2011-08-24 ENCOUNTER — Encounter: Payer: Self-pay | Admitting: Internal Medicine

## 2011-08-24 ENCOUNTER — Ambulatory Visit (INDEPENDENT_AMBULATORY_CARE_PROVIDER_SITE_OTHER)
Admission: RE | Admit: 2011-08-24 | Discharge: 2011-08-24 | Disposition: A | Discharge status: Home / Self Care | Payer: Medicare Other | Source: Ambulatory Visit | Attending: Internal Medicine | Admitting: Internal Medicine

## 2011-08-24 VITALS — BP 138/76 | HR 60 | Temp 98.3°F | Resp 18 | Ht 69.0 in | Wt 214.2 lb

## 2011-08-24 DIAGNOSIS — J189 Pneumonia, unspecified organism: Secondary | ICD-10-CM

## 2011-08-24 MED ORDER — FLUTICASONE PROPIONATE 50 MCG/ACT NA SUSP: 2.0000 | Freq: Every day | NASAL | Status: DC

## 2011-08-24 NOTE — Assessment & Plan Note (Addendum)
See previous entries: S/p avelox which caused generalize itching. Temporary increase of lasix did not help Still coughing. Plan: CXR  Refer to pulmonary, was seen there before (2008), h/o pleural disease, s/p  a decortication.

## 2011-08-24 NOTE — Progress Notes (Signed)
  Subjective:    Patient ID: Bradley [name], male    DOB: [birth year], 70 y.o.   MRN: [id number]  HPI At the last office visit, a chest x-ray show worsening of a infiltrate, he temporarily increase his dose of Lasix and took Avelox. Here for followup  Past Medical History  Diagnosis Date  . HTN (hypertension)     at some point hard to control- saw Dr Lowell Guitar  . HLD (hyperlipidemia)   . DM (diabetes mellitus) 1999    adult onset  . Permanent atrial fibrillation     Dr. Ladona Ridgel  . BPH (benign prostatic hyperplasia)     saw Dr [name] 2004, normal renal u/s  . Leg ulcer 2011  . Chronic diastolic heart failure     Echocardiogram 12/25/10: EF 45-50%; moderate LVH; global hypokinesis; moderate biatrial enlargement; PASP 36;    Cardiac catheterization July 2008: No CAD; moderate pulmonary hypertension  . Pleural effusion 2008    Status post decortication  . Insomnia    Past Surgical History  Procedure Date  . Pleural scarification   . Lung decortication   . Cardiac catheterization   . Colonoscopy 03/10/11    normal  . Pneumothorax with fibrothorax       Review of Systems Still coughing as before, still having postnasal dripping and wheezing at nighttime. He finished Avelox as prescribed; he noticed a generalized itching while he was taking Avelox. He increased Lasix dose temporarily but noticed no relief of his symptoms. No fever or chills Occasional chest pain, anteriorly, last few seconds, pain is random. No shortness or breath of breath, dyspnea on exertion slightly worse than baseline. No lower extremity edema    Objective:   Physical Exam  Constitutional: He appears well-nourished.  Neck: No JVD present.  Cardiovascular:       Irregular  Pulmonary/Chest:       No distress at rest, no expiratory wheezing, no crackles. Breath sounds slightly decreased bilaterally,  Musculoskeletal: He exhibits no edema.          Assessment & Plan:

## 2011-08-27 ENCOUNTER — Ambulatory Visit (INDEPENDENT_AMBULATORY_CARE_PROVIDER_SITE_OTHER): Payer: Medicare Other | Admitting: Internal Medicine

## 2011-08-27 ENCOUNTER — Encounter: Payer: Self-pay | Admitting: Internal Medicine

## 2011-08-27 VITALS — BP 122/64 | HR 73 | Temp 98.3°F | Ht 69.0 in | Wt 217.2 lb

## 2011-08-27 DIAGNOSIS — R0609 Other forms of dyspnea: Secondary | ICD-10-CM

## 2011-08-27 DIAGNOSIS — R0989 Other specified symptoms and signs involving the circulatory and respiratory systems: Secondary | ICD-10-CM

## 2011-08-27 MED ORDER — PREDNISONE (PAK) 10 MG PO TABS: ORAL_TABLET | ORAL | Status: DC

## 2011-08-27 NOTE — Progress Notes (Signed)
  Subjective:    Patient ID: Bradley [name], male    DOB: [birth year], 70 y.o.   MRN: [id number]  HPI  51 yowm quit smoking in 1982 prev eval in 2008 with pleural effusions but all better with no resp issues until Aug 2012 when dx with bronchitis and "downhill since" so referred by Dr Drue Novel back to pulmonary clinic in Nov 2012   08/27/2011 1st pulmonary eval in [facility] cc new onset cough indolent persistent, daily x 3 months,  typically p supper with minimal yellow mucus  Assoc with new sob x sev flights and stop at top of 3 rd -  50% better better p rx with pna no better with albuterol.  No overt sinus or reflux symptoms  Sleeping ok without nocturnal  or early am exacerbation  of respiratory  c/o's or need for noct saba. Also denies any obvious fluctuation of symptoms with weather or environmental changes or other aggravating or alleviating factors except as outlined above    Review of Systems  Constitutional: Negative for fever, chills, activity change, appetite change and unexpected weight change.  HENT: Negative for congestion, sore throat, rhinorrhea, sneezing, trouble swallowing, dental problem, voice change and postnasal drip.   Eyes: Negative for visual disturbance.  Respiratory: Positive for cough and shortness of breath. Negative for choking.   Cardiovascular: Negative for chest pain and leg swelling.  Gastrointestinal: Negative for nausea, vomiting and abdominal pain.  Genitourinary: Negative for difficulty urinating.  Musculoskeletal: Negative for arthralgias.  Skin: Negative for rash.  Psychiatric/Behavioral: Negative for behavioral problems and confusion.       Objective:   Physical Exam  Edentulous wm chewing mint gum with classic pseudowheeze 08/27/2011   217  HEENT mild turbinate edema.  Oropharynx no thrush or excess pnd or cobblestoning.  No JVD or cervical adenopathy. Mild accessory muscle hypertrophy. Trachea midline, nl thryroid. Chest was hyperinflated by percussion  with diminished breath sounds and moderate increased exp time without wheeze. Hoover sign positive at mid inspiration. Regular rate and rhythm without murmur gallop or rub or increase P2 or edema.  Abd: no hsm, nl excursion. Ext warm without cyanosis or clubbing.    cxr 08/24/11 Chronic interstitial lung disease, similar to previous  examinations.  2. Resolution of edema.       Assessment & Plan:

## 2011-08-27 NOTE — Patient Instructions (Signed)
Prednisone 10 mg take  4 each am x 2 days,   2 each am x 2 days,  1 each am x2days and stop   Change lopressor to 50mg  one half in am and one half in pm  Protonix 30-60 min before bfast and Pepcid 20mg  one at bedtime   GERD (REFLUX)  is an extremely common cause of respiratory symptoms, many times with no significant heartburn at all.    It can be treated with medication, but also with lifestyle changes including avoidance of late meals, excessive alcohol, smoking cessation, and avoid fatty foods, chocolate, peppermint, colas, red wine, and acidic juices such as orange juice.  NO MINT OR MENTHOL PRODUCTS SO NO COUGH DROPS  USE SUGARLESS CANDY INSTEAD (jolley ranchers or Stover's)  NO OIL BASED VITAMINS - use powdered substitutes.    Please schedule a follow up office visit in 4 weeks, sooner if needed with pft's

## 2011-08-29 NOTE — Assessment & Plan Note (Addendum)
  When respiratory symptoms begin well after a patient reports complete smoking cessation,  it is very hard to "blame" COPD specifically or airways disorders in general  ie it doesn't make any more sense than hearing a  [employer] driver wrecked his car while driving his kids to school or a surgeon sliced his hand off carving roast beef (it must be rare indeed!)     That is to say, once the high risk activity stops,  the symptoms should not suddenly erupt or markedly worsen.  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   These are the same pts who not infrequently have failed to tolerate ace inhibitors,  dry powder inhalers or biphosphonates or report having reflux symptoms that don't respond to standard doses of PPI , and are easily confused as having aecopd or asthma flares,   For now max gerd rx plus diet  then regroup with full pft's

## 2011-09-04 ENCOUNTER — Emergency Department (INDEPENDENT_AMBULATORY_CARE_PROVIDER_SITE_OTHER): Payer: Medicare Other

## 2011-09-04 ENCOUNTER — Inpatient Hospital Stay (HOSPITAL_BASED_OUTPATIENT_CLINIC_OR_DEPARTMENT_OTHER)
Admission: EM | Admit: 2011-09-04 | Discharge: 2011-09-09 | DRG: 243 | Disposition: A | Discharge status: Home / Self Care | Payer: Medicare Other | Attending: Internal Medicine | Admitting: Internal Medicine

## 2011-09-04 ENCOUNTER — Other Ambulatory Visit: Payer: Self-pay

## 2011-09-04 ENCOUNTER — Encounter (HOSPITAL_COMMUNITY): Payer: Self-pay | Admitting: Nurse Practitioner

## 2011-09-04 DIAGNOSIS — E785 Hyperlipidemia, unspecified: Secondary | ICD-10-CM

## 2011-09-04 DIAGNOSIS — F528 Other sexual dysfunction not due to a substance or known physiological condition: Secondary | ICD-10-CM

## 2011-09-04 DIAGNOSIS — I2789 Other specified pulmonary heart diseases: Secondary | ICD-10-CM

## 2011-09-04 DIAGNOSIS — R0602 Shortness of breath: Secondary | ICD-10-CM

## 2011-09-04 DIAGNOSIS — I5033 Acute on chronic diastolic (congestive) heart failure: Principal | ICD-10-CM | POA: Diagnosis present

## 2011-09-04 DIAGNOSIS — I4891 Unspecified atrial fibrillation: Secondary | ICD-10-CM

## 2011-09-04 DIAGNOSIS — J189 Pneumonia, unspecified organism: Secondary | ICD-10-CM

## 2011-09-04 DIAGNOSIS — R079 Chest pain, unspecified: Secondary | ICD-10-CM

## 2011-09-04 DIAGNOSIS — I428 Other cardiomyopathies: Secondary | ICD-10-CM

## 2011-09-04 DIAGNOSIS — I8310 Varicose veins of unspecified lower extremity with inflammation: Secondary | ICD-10-CM

## 2011-09-04 DIAGNOSIS — Z7901 Long term (current) use of anticoagulants: Secondary | ICD-10-CM

## 2011-09-04 DIAGNOSIS — G4700 Insomnia, unspecified: Secondary | ICD-10-CM

## 2011-09-04 DIAGNOSIS — I10 Essential (primary) hypertension: Secondary | ICD-10-CM

## 2011-09-04 DIAGNOSIS — I5041 Acute combined systolic (congestive) and diastolic (congestive) heart failure: Secondary | ICD-10-CM

## 2011-09-04 DIAGNOSIS — L97909 Non-pressure chronic ulcer of unspecified part of unspecified lower leg with unspecified severity: Secondary | ICD-10-CM

## 2011-09-04 DIAGNOSIS — Z79899 Other long term (current) drug therapy: Secondary | ICD-10-CM

## 2011-09-04 DIAGNOSIS — J441 Chronic obstructive pulmonary disease with (acute) exacerbation: Secondary | ICD-10-CM | POA: Diagnosis present

## 2011-09-04 DIAGNOSIS — E1169 Type 2 diabetes mellitus with other specified complication: Secondary | ICD-10-CM | POA: Diagnosis present

## 2011-09-04 DIAGNOSIS — J45909 Unspecified asthma, uncomplicated: Secondary | ICD-10-CM

## 2011-09-04 DIAGNOSIS — E119 Type 2 diabetes mellitus without complications: Secondary | ICD-10-CM

## 2011-09-04 DIAGNOSIS — I5032 Chronic diastolic (congestive) heart failure: Secondary | ICD-10-CM

## 2011-09-04 DIAGNOSIS — N400 Enlarged prostate without lower urinary tract symptoms: Secondary | ICD-10-CM

## 2011-09-04 DIAGNOSIS — Z87891 Personal history of nicotine dependence: Secondary | ICD-10-CM

## 2011-09-04 DIAGNOSIS — R0609 Other forms of dyspnea: Secondary | ICD-10-CM

## 2011-09-04 DIAGNOSIS — I739 Peripheral vascular disease, unspecified: Secondary | ICD-10-CM

## 2011-09-04 DIAGNOSIS — I509 Heart failure, unspecified: Secondary | ICD-10-CM

## 2011-09-04 DIAGNOSIS — R634 Abnormal weight loss: Secondary | ICD-10-CM

## 2011-09-04 DIAGNOSIS — Z9861 Coronary angioplasty status: Secondary | ICD-10-CM

## 2011-09-04 DIAGNOSIS — I5031 Acute diastolic (congestive) heart failure: Secondary | ICD-10-CM

## 2011-09-04 DIAGNOSIS — R972 Elevated prostate specific antigen [PSA]: Secondary | ICD-10-CM

## 2011-09-04 DIAGNOSIS — Z7982 Long term (current) use of aspirin: Secondary | ICD-10-CM

## 2011-09-04 DIAGNOSIS — I482 Chronic atrial fibrillation, unspecified: Secondary | ICD-10-CM | POA: Diagnosis present

## 2011-09-04 LAB — CARDIAC PANEL(CRET KIN+CKTOT+MB+TROPI)
CK, MB: 2.7 ng/mL (ref 0.3–4.0)
CK, MB: 2.6 ng/mL (ref 0.3–4.0)
Relative Index: INVALID (ref 0.0–2.5)
Relative Index: INVALID (ref 0.0–2.5)
Total CK: 30 U/L (ref 7–232)
Total CK: 30 U/L (ref 7–232)
Troponin I: <0.3 ng/mL (ref <0.30)
Troponin I: <0.3 ng/mL (ref <0.30)

## 2011-09-04 LAB — GLUCOSE, CAPILLARY
Glucose-Capillary: 77 mg/dL (ref 70–99)
Glucose-Capillary: 125 mg/dL — ABNORMAL HIGH (ref 70–99)

## 2011-09-04 LAB — LIPID PANEL
Cholesterol: 129 mg/dL (ref 0–200)
HDL: 40 mg/dL (ref >39)
LDL Cholesterol: 68 mg/dL (ref 0–99)
Total CHOL/HDL Ratio: 3.2 RATIO
Triglycerides: 106 mg/dL (ref <150)
VLDL: 21 mg/dL (ref 0–40)

## 2011-09-04 LAB — D-DIMER, QUANTITATIVE: D-Dimer, Quant: <0.22 ug/mL-FEU (ref 0.00–0.48)

## 2011-09-04 LAB — PROTIME-INR
INR: 2.31 — ABNORMAL HIGH (ref 0.00–1.49)
Prothrombin Time: 25.8 seconds — ABNORMAL HIGH (ref 11.6–15.2)

## 2011-09-04 LAB — COMPREHENSIVE METABOLIC PANEL
ALT: 35 U/L (ref 0–53)
AST: 18 U/L (ref 0–37)
Albumin: 3.4 g/dL — ABNORMAL LOW (ref 3.5–5.2)
Alkaline Phosphatase: 66 U/L (ref 39–117)
BUN: 13 mg/dL (ref 6–23)
CO2: 28 mEq/L (ref 19–32)
Calcium: 9 mg/dL (ref 8.4–10.5)
Chloride: 103 mEq/L (ref 96–112)
Creatinine, Ser: 0.7 mg/dL (ref 0.50–1.35)
GFR calc Af Amer: >90 mL/min (ref >90)
GFR calc non Af Amer: >90 mL/min (ref >90)
Glucose, Bld: 162 mg/dL — ABNORMAL HIGH (ref 70–99)
Potassium: 4.2 mEq/L (ref 3.5–5.1)
Sodium: 139 mEq/L (ref 135–145)
Total Bilirubin: 1.1 mg/dL (ref 0.3–1.2)
Total Protein: 6.9 g/dL (ref 6.0–8.3)

## 2011-09-04 LAB — CBC
HCT: 40.7 % (ref 39.0–52.0)
Hemoglobin: 14 g/dL (ref 13.0–17.0)
MCH: 30.6 pg (ref 26.0–34.0)
MCHC: 34.4 g/dL (ref 30.0–36.0)
MCV: 88.9 fL (ref 78.0–100.0)
Platelets: 225 10*3/uL (ref 150–400)
RBC: 4.58 MIL/uL (ref 4.22–5.81)
RDW: 14.3 % (ref 11.5–15.5)
WBC: 10.9 10*3/uL — ABNORMAL HIGH (ref 4.0–10.5)

## 2011-09-04 LAB — APTT: aPTT: 46 seconds — ABNORMAL HIGH (ref 24–37)

## 2011-09-04 LAB — PRO B NATRIURETIC PEPTIDE: Pro B Natriuretic peptide (BNP): 1595 pg/mL — ABNORMAL HIGH (ref 0–125)

## 2011-09-04 MED ORDER — DIGOXIN 250 MCG PO TABS
250.0000 ug | ORAL_TABLET | Freq: Every day | ORAL | Status: DC
  Administered 2011-09-05 – 2011-09-08 (×4): 250 ug via ORAL
  Filled 2011-09-04 (×4): qty 1

## 2011-09-04 MED ORDER — NITROGLYCERIN 2 % TD OINT
TOPICAL_OINTMENT | TRANSDERMAL | Status: AC
  Administered 2011-09-04: 1 [in_us] via TOPICAL
  Filled 2011-09-04: qty 30

## 2011-09-04 MED ORDER — ASPIRIN 81 MG PO CHEW
324.0000 mg | CHEWABLE_TABLET | Freq: Once | ORAL | Status: DC
  Filled 2011-09-04: qty 1

## 2011-09-04 MED ORDER — ONE-DAILY MULTI VITAMINS PO TABS
1.0000 | ORAL_TABLET | Freq: Every day | ORAL | Status: DC
  Administered 2011-09-05 – 2011-09-08 (×4): 1 via ORAL
  Filled 2011-09-04 (×4): qty 1

## 2011-09-04 MED ORDER — MORPHINE SULFATE 2 MG/ML IJ SOLN
2.0000 mg | Freq: Once | INTRAMUSCULAR | Status: AC
  Administered 2011-09-04: 2 mg via INTRAVENOUS

## 2011-09-04 MED ORDER — SODIUM CHLORIDE 0.9 % IJ SOLN
3.0000 mL | Freq: Two times a day (BID) | INTRAMUSCULAR | Status: DC
  Administered 2011-09-04 – 2011-09-07 (×7): 3 mL via INTRAVENOUS
  Filled 2011-09-04: qty 3

## 2011-09-04 MED ORDER — FAMOTIDINE 20 MG PO TABS
20.0000 mg | ORAL_TABLET | Freq: Every day | ORAL | Status: DC
  Administered 2011-09-04 – 2011-09-08 (×5): 20 mg via ORAL
  Filled 2011-09-04 (×6): qty 1

## 2011-09-04 MED ORDER — LEVALBUTEROL HCL 0.63 MG/3ML IN NEBU
0.6300 mg | INHALATION_SOLUTION | Freq: Four times a day (QID) | RESPIRATORY_TRACT | Status: DC
  Administered 2011-09-04 – 2011-09-08 (×13): 0.63 mg via RESPIRATORY_TRACT
  Filled 2011-09-04 (×19): qty 3

## 2011-09-04 MED ORDER — DOXYCYCLINE HYCLATE 100 MG PO TABS
100.0000 mg | ORAL_TABLET | Freq: Two times a day (BID) | ORAL | Status: DC
  Administered 2011-09-04 – 2011-09-08 (×8): 100 mg via ORAL
  Filled 2011-09-04 (×9): qty 1

## 2011-09-04 MED ORDER — METOPROLOL TARTRATE 25 MG PO TABS
25.0000 mg | ORAL_TABLET | Freq: Two times a day (BID) | ORAL | Status: DC
  Administered 2011-09-04: 25 mg via ORAL
  Filled 2011-09-04 (×3): qty 1

## 2011-09-04 MED ORDER — DM-GUAIFENESIN ER 30-600 MG PO TB12
1.0000 | ORAL_TABLET | Freq: Two times a day (BID) | ORAL | Status: DC
  Administered 2011-09-04 – 2011-09-09 (×10): 1 via ORAL
  Filled 2011-09-04 (×11): qty 1

## 2011-09-04 MED ORDER — LOVASTATIN 20 MG PO TABS
20.0000 mg | ORAL_TABLET | Freq: Every day | ORAL | Status: DC
  Filled 2011-09-04: qty 1

## 2011-09-04 MED ORDER — SODIUM CHLORIDE 0.9 % IJ SOLN: 3.0000 mL | INTRAMUSCULAR | Status: DC | PRN

## 2011-09-04 MED ORDER — ONDANSETRON HCL 4 MG/2ML IJ SOLN: 4.0000 mg | Freq: Four times a day (QID) | INTRAMUSCULAR | Status: DC | PRN

## 2011-09-04 MED ORDER — DILTIAZEM HCL ER COATED BEADS 180 MG PO CP24
180.0000 mg | ORAL_CAPSULE | Freq: Every day | ORAL | Status: DC
  Administered 2011-09-04 – 2011-09-08 (×5): 180 mg via ORAL
  Filled 2011-09-04 (×5): qty 1

## 2011-09-04 MED ORDER — PANTOPRAZOLE SODIUM 40 MG PO TBEC
40.0000 mg | DELAYED_RELEASE_TABLET | Freq: Every day | ORAL | Status: DC
  Administered 2011-09-04 – 2011-09-09 (×5): 40 mg via ORAL
  Filled 2011-09-04 (×4): qty 1

## 2011-09-04 MED ORDER — NIACIN-LOVASTATIN ER 1000-20 MG PO TB24: 1.0000 | ORAL_TABLET | Freq: Every day | ORAL | Status: DC

## 2011-09-04 MED ORDER — FUROSEMIDE 10 MG/ML IJ SOLN
40.0000 mg | Freq: Two times a day (BID) | INTRAMUSCULAR | Status: DC
  Administered 2011-09-04 – 2011-09-09 (×11): 40 mg via INTRAVENOUS
  Filled 2011-09-04 (×12): qty 4

## 2011-09-04 MED ORDER — FLUTICASONE PROPIONATE 50 MCG/ACT NA SUSP
1.0000 | Freq: Every day | NASAL | Status: DC
  Administered 2011-09-04 – 2011-09-09 (×6): 1 via NASAL
  Filled 2011-09-04: qty 16

## 2011-09-04 MED ORDER — SODIUM CHLORIDE 0.9 % IJ SOLN
3.0000 mL | INTRAMUSCULAR | Status: DC | PRN
  Administered 2011-09-06: 3 mL via INTRAVENOUS
  Filled 2011-09-04: qty 3

## 2011-09-04 MED ORDER — INSULIN ASPART 100 UNIT/ML ~~LOC~~ SOLN
0.0000 [IU] | Freq: Three times a day (TID) | SUBCUTANEOUS | Status: DC
Start: 2011-09-05 — End: 2011-09-08
  Administered 2011-09-05: 2 [IU] via SUBCUTANEOUS
  Administered 2011-09-05: 1 [IU] via SUBCUTANEOUS
  Administered 2011-09-06: 2 [IU] via SUBCUTANEOUS
  Administered 2011-09-06: 1 [IU] via SUBCUTANEOUS
  Administered 2011-09-07: 2 [IU] via SUBCUTANEOUS
  Administered 2011-09-07: 1 [IU] via SUBCUTANEOUS
  Administered 2011-09-08: 2 [IU] via SUBCUTANEOUS

## 2011-09-04 MED ORDER — HYDROCODONE-ACETAMINOPHEN 5-325 MG PO TABS: 1.0000 | ORAL_TABLET | ORAL | Status: DC | PRN

## 2011-09-04 MED ORDER — NITROGLYCERIN 0.4 MG SL SUBL
0.4000 mg | SUBLINGUAL_TABLET | SUBLINGUAL | Status: DC | PRN
  Administered 2011-09-04: 0.4 mg via SUBLINGUAL
  Filled 2011-09-04: qty 25

## 2011-09-04 MED ORDER — INSULIN ASPART 100 UNIT/ML ~~LOC~~ SOLN
0.0000 [IU] | Freq: Every day | SUBCUTANEOUS | Status: DC
  Filled 2011-09-04: qty 3

## 2011-09-04 MED ORDER — SODIUM CHLORIDE 0.9 % IV SOLN
250.0000 mL | INTRAVENOUS | Status: DC
  Administered 2011-09-04: 250 mL via INTRAVENOUS

## 2011-09-04 MED ORDER — MORPHINE SULFATE 2 MG/ML IJ SOLN
INTRAMUSCULAR | Status: AC
  Administered 2011-09-04: 2 mg via INTRAVENOUS
  Filled 2011-09-04: qty 1

## 2011-09-04 MED ORDER — DILTIAZEM HCL 50 MG/10ML IV SOLN
10.0000 mg | Freq: Once | INTRAVENOUS | Status: AC
  Administered 2011-09-04: 10 mg via INTRAVENOUS
  Filled 2011-09-04: qty 2

## 2011-09-04 MED ORDER — ASPIRIN 325 MG PO TABS
325.0000 mg | ORAL_TABLET | Freq: Every day | ORAL | Status: DC
  Administered 2011-09-06 – 2011-09-09 (×3): 325 mg via ORAL
  Filled 2011-09-04 (×5): qty 1

## 2011-09-04 MED ORDER — WARFARIN SODIUM 5 MG PO TABS
5.0000 mg | ORAL_TABLET | Freq: Every day | ORAL | Status: DC
  Filled 2011-09-04: qty 1

## 2011-09-04 MED ORDER — LINAGLIPTIN 5 MG PO TABS
5.0000 mg | ORAL_TABLET | Freq: Every day | ORAL | Status: DC
  Administered 2011-09-05 – 2011-09-09 (×4): 5 mg via ORAL
  Filled 2011-09-04 (×5): qty 1

## 2011-09-04 MED ORDER — GLYBURIDE 5 MG PO TABS
10.0000 mg | ORAL_TABLET | Freq: Two times a day (BID) | ORAL | Status: DC
  Administered 2011-09-04 – 2011-09-09 (×10): 10 mg via ORAL
  Filled 2011-09-04 (×12): qty 2

## 2011-09-04 MED ORDER — ZOLPIDEM TARTRATE 5 MG PO TABS
10.0000 mg | ORAL_TABLET | Freq: Every day | ORAL | Status: DC
  Administered 2011-09-04 – 2011-09-07 (×4): 10 mg via ORAL
  Filled 2011-09-04 (×2): qty 1
  Filled 2011-09-04 (×3): qty 2

## 2011-09-04 MED ORDER — ACETAMINOPHEN 325 MG PO TABS
650.0000 mg | ORAL_TABLET | ORAL | Status: DC | PRN
  Administered 2011-09-04: 650 mg via ORAL
  Filled 2011-09-04 (×2): qty 2

## 2011-09-04 MED ORDER — SIMVASTATIN 10 MG PO TABS
10.0000 mg | ORAL_TABLET | Freq: Every day | ORAL | Status: DC
  Administered 2011-09-04 – 2011-09-08 (×5): 10 mg via ORAL
  Filled 2011-09-04 (×6): qty 1

## 2011-09-04 MED ORDER — DILTIAZEM HCL ER COATED BEADS 180 MG PO CP24: 180.0000 mg | ORAL_CAPSULE | Freq: Every day | ORAL | Status: DC

## 2011-09-04 MED ORDER — LOSARTAN POTASSIUM 50 MG PO TABS
100.0000 mg | ORAL_TABLET | Freq: Every day | ORAL | Status: DC
  Administered 2011-09-05 – 2011-09-09 (×5): 100 mg via ORAL
  Filled 2011-09-04 (×5): qty 2

## 2011-09-04 MED ORDER — FUROSEMIDE 10 MG/ML IJ SOLN
40.0000 mg | Freq: Once | INTRAMUSCULAR | Status: AC
  Administered 2011-09-04: 40 mg via INTRAVENOUS
  Filled 2011-09-04: qty 4

## 2011-09-04 MED ORDER — NIACIN ER 500 MG PO CPCR
1000.0000 mg | ORAL_CAPSULE | Freq: Every day | ORAL | Status: DC
  Administered 2011-09-04 – 2011-09-08 (×5): 1000 mg via ORAL
  Filled 2011-09-04 (×6): qty 2

## 2011-09-04 MED ORDER — NITROGLYCERIN 2 % TD OINT
1.0000 [in_us] | TOPICAL_OINTMENT | Freq: Once | TRANSDERMAL | Status: AC
  Administered 2011-09-04: 1 [in_us] via TOPICAL

## 2011-09-04 MED ORDER — IPRATROPIUM BROMIDE 0.02 % IN SOLN
0.5000 mg | Freq: Four times a day (QID) | RESPIRATORY_TRACT | Status: DC
  Administered 2011-09-04 – 2011-09-08 (×13): 0.5 mg via RESPIRATORY_TRACT
  Filled 2011-09-04 (×15): qty 2.5

## 2011-09-04 NOTE — ED Notes (Signed)
Pt having left sided chest pain x 2 days.  Some Sob and diaphoresis.  No N/V/D.  Pain worsens with deep breath, lying down and exertion.  Pt states it feels like a pinching sensation.  No known fever but does have productive cough.  Recently hospitalized for pneumonia one month ago.

## 2011-09-04 NOTE — ED Provider Notes (Addendum)
History     CSN: [id number] Arrival date & time: 09/04/2011 11:40 AM   First MD Initiated Contact with Patient 09/04/11 1203      Chief Complaint  Patient presents with  . Chest Pain  . Shortness of Breath    (Consider location/radiation/quality/duration/timing/severity/associated sxs/prior treatment) HPI Comments: The patient is a 70 year old male with a history of hypertension, hyperlipidemia, diabetes mellitus, atrial fibrillation, diastolic congestive heart failure, and prior cardiac cath with stent placement for coronary artery disease who presents for evaluation of left-sided chest pain that is pressure-like in character, associated with shortness of breath and diaphoresis, and brought on by exertion, relieved by rest. This is not usual for the patient to get chest pain with exertion.  Patient is a 70 y.o. male presenting with chest pain. The history is provided by the patient and medical records.  Chest Pain The chest pain began 2 days ago. Duration of episode(s) is 5 minutes. Chest pain occurs intermittently. The chest pain is resolved. The pain is associated with exertion. At its most intense, the pain is at 8/10. The pain is currently at 0/10. The quality of the pain is described as pressure-like. The pain does not radiate. Chest pain is worsened by exertion. Primary symptoms include shortness of breath and cough. Pertinent negatives for primary symptoms include no fever, no fatigue, no syncope, no wheezing, no palpitations, no abdominal pain, no nausea, no vomiting, no dizziness and no altered mental status.  Associated symptoms include diaphoresis.  Pertinent negatives for associated symptoms include no claudication, no lower extremity edema, no near-syncope, no numbness, no orthopnea, no paroxysmal nocturnal dyspnea and no weakness. He tried nothing for the symptoms. Risk factors include lack of exercise, male gender, obesity and sedentary lifestyle.  His past medical history is  significant for arrhythmia, CAD, CHF, diabetes, hyperlipidemia and hypertension.  Pertinent negatives for past medical history include no MI and no PE.  Procedure history is positive for cardiac catheterization.     Past Medical History  Diagnosis Date  . HTN (hypertension)     at some point hard to control- saw Dr Lowell Guitar  . HLD (hyperlipidemia)   . DM (diabetes mellitus) 1999    adult onset  . Permanent atrial fibrillation     Dr. Ladona Ridgel  . BPH (benign prostatic hyperplasia)     saw Dr [name] 2004, normal renal u/s  . Leg ulcer 2011  . Chronic diastolic heart failure     Echocardiogram 12/25/10: EF 45-50%; moderate LVH; global hypokinesis; moderate biatrial enlargement; PASP 36;    Cardiac catheterization July 2008: No CAD; moderate pulmonary hypertension  . Pleural effusion 2008    Status post decortication  . Insomnia     Past Surgical History  Procedure Date  . Pleural scarification   . Lung decortication   . Cardiac catheterization   . Colonoscopy 03/10/11    normal  . Pneumothorax with fibrothorax     Family History  Problem Relation Age of Onset  . Diabetes Father   . Breast cancer Maternal Aunt   . Heart attack Neg Hx   . Prostate cancer Neg Hx   . Colon cancer Neg Hx   . Cancer Neg Hx     colon or prostate    History  Substance Use Topics  . Smoking status: Former Smoker -- 2.5 packs/day for 25 years    Types: Cigarettes    Quit date: 10/11/1980  . Smokeless tobacco: Never Used  . Alcohol Use: Yes  occas      Review of Systems  Constitutional: Positive for diaphoresis. Negative for fever and fatigue.  Respiratory: Positive for cough and shortness of breath. Negative for wheezing.   Cardiovascular: Positive for chest pain. Negative for palpitations, orthopnea, claudication, syncope and near-syncope.  Gastrointestinal: Negative for nausea, vomiting and abdominal pain.  Neurological: Negative for dizziness, weakness and numbness.    Psychiatric/Behavioral: Negative for altered mental status.  All other systems reviewed and are negative.    Allergies  Avelox and Tadalafil  Home Medications   Current Outpatient Rx  Name Route Sig Dispense Refill  . ALBUTEROL SULFATE HFA 108 (90 BASE) MCG/ACT IN AERS Inhalation Inhale 2 puffs into the lungs every 6 (six) hours as needed for wheezing. 1 Inhaler 0  . ASPIRIN 325 MG PO TABS Oral Take 325 mg by mouth daily.      Marland Kitchen BAYER CONTOUR TEST VI STRP  as directed.    Marland Kitchen DEXTROMETHORPHAN-GUAIFENESIN 30-600 MG PO TB12 Oral Take 1 tablet by mouth as needed.      Marland Kitchen DIGOXIN 0.25 MG PO TABS Oral Take 1 tablet (250 mcg total) by mouth daily. 90 tablet 3  . DILTIAZEM HCL COATED BEADS 180 MG PO CP24 Oral Take 1 capsule (180 mg total) by mouth daily. 90 capsule 3  . FLUTICASONE PROPIONATE 50 MCG/ACT NA SUSP Nasal Place 2 sprays into the nose daily. 16 g 2  . FUROSEMIDE 40 MG PO TABS Oral Take 40 mg by mouth daily.     . GLYBURIDE 5 MG PO TABS Oral Take 4 tablets (20 mg total) by mouth daily. 360 tablet 3  . HYDROCODONE-ACETAMINOPHEN 5-325 MG PO TABS Oral Take 1 tablet by mouth every 4 (four) hours as needed.      Marland Kitchen LOSARTAN POTASSIUM 100 MG PO TABS Oral Take 1 tablet (100 mg total) by mouth daily. 90 tablet 3  . METFORMIN HCL 850 MG PO TABS Oral Take 1 tablet (850 mg total) by mouth 2 (two) times daily with a meal. 180 tablet 3  . METOPROLOL TARTRATE 50 MG PO TABS Oral Take 75 mg by mouth 2 (two) times daily.      Marland Kitchen ONE-DAILY MULTI VITAMINS PO TABS Oral Take 1 tablet by mouth daily.      Marland Kitchen NIACIN-LOVASTATIN 1000-20 MG PO TB24 Oral Take 1 tablet by mouth daily. 90 tablet 3  . PANTOPRAZOLE SODIUM 40 MG PO TBEC Oral Take 1 tablet (40 mg total) by mouth daily. 30 tablet 3  . PREDNISONE (PAK) 10 MG PO TABS  Prednisone 10 mg take  4 each am x 2 days,   2 each am x 2 days,  1 each am x2days and stop 14 tablet 0  . SITAGLIPTIN PHOSPHATE 100 MG PO TABS Oral Take 1 tablet (100 mg total) by mouth  daily. 90 tablet 3  . WARFARIN SODIUM 5 MG PO TABS Oral Take 5 mg by mouth daily.      Marland Kitchen ZOLPIDEM TARTRATE 10 MG PO TABS Oral Take 10 mg by mouth at bedtime as needed.      Marland Kitchen CLOBETASOL PROPIONATE 0.05 % EX CREA Topical Apply topically as directed.        BP 136/101  Pulse 102  Temp(Src) 97.8 F (36.6 C) (Oral)  Resp 20  Ht 5\' 9"  (1.753 m)  Wt 214 lb (97.07 kg)  BMI 31.60 kg/m2  SpO2 95%  Physical Exam  Nursing note and vitals reviewed. Constitutional: He is oriented to person, place, and  time. He appears well-developed and well-nourished. No distress.  HENT:  Head: Normocephalic and atraumatic.  Mouth/Throat: Oropharynx is clear and moist.  Eyes: EOM are normal. Pupils are equal, round, and reactive to light.  Neck: Normal range of motion. Neck supple. No JVD present. No tracheal deviation present.  Cardiovascular: Normal rate, S1 normal, S2 normal, normal heart sounds and intact distal pulses.  An irregular rhythm present.  No extrasystoles are present. PMI is not displaced.  Exam reveals no gallop and no friction rub.   No murmur heard. Pulmonary/Chest: Effort normal and breath sounds normal. No accessory muscle usage or stridor. Not tachypneic. No respiratory distress. He has no decreased breath sounds. He has no wheezes. He has no rhonchi. He has no rales. He exhibits no tenderness, no bony tenderness, no crepitus and no retraction.  Abdominal: Soft. Bowel sounds are normal. He exhibits no distension and no mass. There is no tenderness. There is no rebound and no guarding.  Musculoskeletal: Normal range of motion. He exhibits no edema and no tenderness.  Neurological: He is alert and oriented to person, place, and time. No cranial nerve deficit. He exhibits normal muscle tone.  Skin: Skin is warm and dry. No rash noted. He is not diaphoretic. No erythema. No pallor.  Psychiatric: He has a normal mood and affect. His behavior is normal. Judgment and thought content normal.    ED  Course  Procedures (including critical care time)  Date: 09/04/2011  Rate: 94  Rhythm: atrial fibrillation  QRS Axis: normal  Intervals: normal  ST/T Wave abnormalities: nonspecific ST/T changes  Conduction Disutrbances:none  Narrative Interpretation: Abnormal EKG but non-provocative when compared with the prior EKG. There are T-wave inversions in the anterolateral leads and T wave inversions with ST depressions in the limb leads, but these are redemonstrated and unchanged in morphology from previous EKG.  Old EKG Reviewed: unchanged   Labs Reviewed  PRO B NATRIURETIC PEPTIDE - Abnormal; Notable for the following:    BNP, POC 1595.0 (*)    All other components within normal limits  CBC - Abnormal; Notable for the following:    WBC 10.9 (*)    All other components within normal limits  COMPREHENSIVE METABOLIC PANEL - Abnormal; Notable for the following:    Glucose, Bld 162 (*)    Albumin 3.4 (*)    All other components within normal limits  PROTIME-INR - Abnormal; Notable for the following:    Prothrombin Time 25.8 (*)    INR 2.31 (*)    All other components within normal limits  APTT - Abnormal; Notable for the following:    aPTT 46 (*)    All other components within normal limits  CARDIAC PANEL(CRET KIN+CKTOT+MB+TROPI)   Dg Chest Portable 1 View  09/04/2011  *RADIOLOGY REPORT*  Clinical Data: Chest pain, shortness of breath  PORTABLE CHEST - 1 VIEW  Comparison: 08/24/2011  Findings: Interval increase in mild diffuse interstitial edema. Patchy airspace opacities at the lung bases, left greater than right, slightly increased.  Suspect small right pleural effusion. Mild cardiomegaly stable.  Atheromatous aortic arch.  IMPRESSION: 1.  Interval increase in bilateral edema or infiltrates. 2.  Suspect small right pleural effusion.  Original Report Authenticated By: Osa Craver, M.D.     No diagnosis found.    MDM  Musculoskeletal chest pain, costochondritis, GERD,  Gastrointestinal Chest Pain, Pleuritic Chest Pain, Pneumonia, Pneumothorax, Pulmonary Embolism, Esophageal Spasm, Arrhythmia, congestive heart failure, myocardial infarction, unstable angina, ACS considered among other  potential etiologies in the patient's differential diagnosis.  The patient's evaluation on x-ray shows pulmonary edema and pulmonary vascular congestion along with an elevated B. natriuretic peptide, higher than previous values for the patient in suggestive of some element of congestive heart failure. He is negative for myocardial infarction on the workup but his symptoms are concerning for a new onset of what would otherwise be stable angina. With the patient's numerous risk factors and known coronary artery disease, I will seek admission for him in the hospital. The patient states his understanding of and agreement with this plan of care.   2:01 PM I have spoken with Dr. Dickey Gave of [facility]/Hospitalist service at [facility] and he has accepted the patient for transfer admission.      Felisa Bonier, MD 09/04/11 1340  Felisa Bonier, MD 09/04/11 1353  Felisa Bonier, MD 09/04/11 [phone]

## 2011-09-04 NOTE — H&P (Signed)
PCP:   [name], MD, MD  Primary cardiologist: Dr. Sharrell Ku Primary pulmonologist: Dr. Sandrea Hughs  Chief Complaint:  Shortness of breath worsening over the last 2 months now with some chest tightness and worsening dyspnea for last 2-3 days  HPI: Patient is 70 year old Caucasian male with complex medical history including hypertension, hyperlipidemia, diabetes, icterus fibrillation on Coumadin, BPH, history of chronic diastolic heart failure with EF of 45-50% in March 2012, history of pleural effusion status post decortication in 2008 presented to the [facility] ED with worsening shortness of breath. History was obtained from the patient who did not appear to be in any acute respiratory distress, able to complete full sentences, , stated that he was in good shape after his decortication surgery in 2008. After 2 years he started having some dyspnea again, he started working out and lost about 20-25 pounds. Patient states that in the last 2 months his pain having increasing shortness of breath with tiredness and fatigue, dyspnea on exertion. Patient states that he goes to the gym but now is unable to use the treadmill for more than 5 minutes for last 2 weeks. He feels short of breath after ambulating about 10 feet. He also endorses having some chest pressure but no chest pain with palpitations off and on for last 2 days. He denied any dizziness, lightheadedness or any syncopal episode or any diaphoresis. He denies any fever, chills or any productive cough. He states that he was treated for bronchitis by his primary care physician, [name], with antibiotics and prednisone which he has finished.  Review of Systems:  Constitutional: Denies fever, chills, diaphoresis, appetite change and fatigue.  HEENT: Denies photophobia, eye pain, redness, hearing loss, ear pain, congestion, sore throat, rhinorrhea, sneezing, mouth sores, trouble swallowing, neck pain, neck stiffness and tinnitus.     Respiratory: SEE HPI Cardiovascular: SEE HPI Gastrointestinal: Denies nausea, vomiting, abdominal pain, diarrhea, constipation, blood in stool and abdominal distention.  Genitourinary: Denies dysuria, urgency, frequency, hematuria, flank pain and difficulty urinating.  Musculoskeletal: Admits to having fatigue more due to shortness of breath. Denies myalgias, back pain, joint swelling, arthralgias and gait problem.  Skin: Denies pallor, rash and wound.  Neurological: Denies dizziness, seizures, syncope, weakness, light-headedness, numbness and headaches.  Hematological: Denies adenopathy. Easy bruising, personal or family bleeding history  Psychiatric/Behavioral: Denies suicidal ideation, mood changes, confusion, nervousness, sleep disturbance and agitation  Past Medical History: Past Medical History  Diagnosis Date  . HTN (hypertension)     at some point hard to control- saw Dr Lowell Guitar  . HLD (hyperlipidemia)   . DM (diabetes mellitus) 1999    adult onset  . Permanent atrial fibrillation     Dr. Ladona Ridgel  . BPH (benign prostatic hyperplasia)     saw Dr [name] 2004, normal renal u/s  . Leg ulcer 2011  . Chronic diastolic heart failure     Echocardiogram 12/25/10: EF 45-50%; moderate LVH; global hypokinesis; moderate biatrial enlargement; PASP 36;    Cardiac catheterization July 2008: No CAD; moderate pulmonary hypertension  . Pleural effusion 2008    Status post decortication  . Insomnia    Past Surgical History  Procedure Date  . Pleural scarification   . Lung decortication   . Cardiac catheterization   . Colonoscopy 03/10/11    normal  . Pneumothorax with fibrothorax     Medications: Prior to Admission medications   Medication Sig Start Date End Date Taking? Authorizing Provider  albuterol (PROVENTIL HFA;VENTOLIN HFA)  108 (90 BASE) MCG/ACT inhaler Inhale 2 puffs into the lungs every 6 (six) hours as needed for wheezing. 05/18/11 05/17/12 Yes Melissa S. Peggyann Juba, NP   aspirin 325 MG tablet Take 325 mg by mouth daily.     Yes Historical Provider, MD  dextromethorphan-guaiFENesin (MUCINEX DM) 30-600 MG per 12 hr tablet Take 1 tablet by mouth as needed. For congestion   Yes Historical Provider, MD  digoxin (LANOXIN) 0.25 MG tablet Take 1 tablet (250 mcg total) by mouth daily. 04/15/11  Yes [name], MD  diltiazem (CARDIZEM CD) 180 MG 24 hr capsule Take 1 capsule (180 mg total) by mouth daily. 08/10/11 08/09/12 Yes Lewayne Bunting, MD  famotidine (PEPCID) 20 MG tablet Take 20 mg by mouth at bedtime.     Yes Historical Provider, MD  fluticasone (FLONASE) 50 MCG/ACT nasal spray Place 1 spray into the nose daily.   08/24/11 08/23/12 Yes [name], MD  furosemide (LASIX) 40 MG tablet Take 40 mg by mouth daily.    Yes Historical Provider, MD  glyBURIDE (DIABETA) 5 MG tablet Take 10 mg by mouth 2 (two) times daily with a meal.   04/15/11  Yes [name], MD  losartan (COZAAR) 100 MG tablet Take 1 tablet (100 mg total) by mouth daily. 04/15/11  Yes [name], MD  metFORMIN (GLUCOPHAGE) 850 MG tablet Take 1 tablet (850 mg total) by mouth 2 (two) times daily with a meal. 04/15/11  Yes [name], MD  metoprolol (LOPRESSOR) 50 MG tablet Take 25 mg by mouth 2 (two) times daily.    Yes Historical Provider, MD  Multiple Vitamin (MULTIVITAMIN) tablet Take 1 tablet by mouth daily.     Yes Historical Provider, MD  niacin-lovastatin (ADVICOR) 1000-20 MG 24 hr tablet Take 1 tablet by mouth at bedtime.   05/26/11  Yes [name], MD  pantoprazole (PROTONIX) 40 MG tablet Take 40 mg by mouth every morning.     Yes Historical Provider, MD  sitaGLIPtin (JANUVIA) 100 MG tablet Take 1 tablet (100 mg total) by mouth daily. 04/15/11  Yes [name], MD  warfarin (COUMADIN) 5 MG tablet Take 5 mg by mouth at bedtime.    Yes Historical Provider, MD  zolpidem (AMBIEN) 10 MG tablet Take 10 mg by mouth at bedtime.    Yes Historical Provider, MD  clobetasol (TEMOVATE) 0.05 % cream Apply topically as  directed.      Historical Provider, MD  HYDROcodone-acetaminophen (NORCO) 5-325 MG per tablet Take 1 tablet by mouth every 4 (four) hours as needed. For pain    Historical Provider, MD    Allergies:   Allergies  Allergen Reactions  . Avelox (Moxifloxacin Hydrochloride) Itching    Headache  . Tadalafil     REACTION: HA, backache    Social History:  reports that he quit smoking about 30 years ago. His smoking use included Cigarettes. He has a 62.5 pack-year smoking history. He has never used smokeless tobacco. He reports that he drinks alcohol but very occasionally. He reports that he does not use illicit drugs.  Family History: Family History  Problem Relation Age of Onset  . Diabetes Father   . Breast cancer Maternal Aunt   . Heart attack Neg Hx   . Prostate cancer Neg Hx   . Colon cancer Neg Hx   . Cancer Neg Hx     colon or prostate    Physical Exam: Blood pressure 155/90, pulse 95, temperature 96.6 F (  35.9 C), temperature source Oral, resp. rate 20, height 5\' 9"  (1.753 m), weight 96.6 kg (212 lb 15.4 oz), SpO2 96.00%. General: Alert, awake, oriented x3, in no acute distress. HEENT: anicteric sclera, pink conjunctiva, pupils equal and reactive to light and accomodation Neck: supple, no masses or lymphadenopathy, no goiter, no bruits  Heart: Regular rate and rhythm, without murmurs, rubs or gallops. Lungs: Decreased breath sounds at the bases Abdomen: Soft, nontender, nondistended, positive bowel sounds, no masses. Extremities: No clubbing, cyanosis or edema with positive pedal pulses. Neuro: Grossly intact, no focal neurological deficits, strength 5/5 upper and lower extremities bilaterally Psych: alert and oriented x 3, normal mood and affect Skin: warm and dry, chronic venous stasis changes on his bilateral lower extremities, no edema   LABS on Admission:  Basic Metabolic Panel:  Lab 09/04/11 1610  NA 139  K 4.2  CL 103  CO2 28  GLUCOSE 162*  BUN 13   CREATININE 0.70  CALCIUM 9.0  MG --  PHOS --   Liver Function Tests:  Lab 09/04/11 1200  AST 18  ALT 35  ALKPHOS 66  BILITOT 1.1  PROT 6.9  ALBUMIN 3.4*   CBC:  Lab 09/04/11 1200  WBC 10.9*  NEUTROABS --  HGB 14.0  HCT 40.7  MCV 88.9  PLT 225   Cardiac Enzymes:  Lab 09/04/11 1200  CKTOTAL 30  CKMB 2.7  CKMBINDEX --  TROPONINI <0.30   BNP:  Lab 09/04/11 1200  POCBNP 1595.0*   CBG:  Lab 09/04/11 1644  GLUCAP 77     Radiological Exams on Admission: Dg Chest 2 View  08/24/2011  *RADIOLOGY REPORT*  Clinical Data: Cough.  Shortness of breath and congestion  CHEST - 2 VIEW  Comparison: 08/16/2011  Findings: The heart size appears moderately enlarged.  There is no pleural effusion identified. There has been interval resolution of previously noted interstitial edema.  Diffuse, coarsened interstitial markings are noted bilaterally consistent with chronic interstitial lung disease.  No focal areas of airspace consolidation noted.  IMPRESSION:  1.  Chronic interstitial lung disease, similar to previous examinations. 2.  Resolution of edema.  Original Report Authenticated By: Rosealee Albee, M.D.   Dg Chest 2 View  08/16/2011  *RADIOLOGY REPORT*  Clinical Data: 70 year old male with cough, shortness of breath, chest pain.  CHEST - 2 VIEW  Comparison: 07/31/2011 and earlier.  Findings: Interval progression of basilar predominant reticular nodular bilateral pulmonary opacity.  There remains trace fluid in the fissures. Stable cardiomegaly and mediastinal contours.  No consolidation.  Left perihilar patchy opacity has diminished. Stable visualized osseous structures.  IMPRESSION: Interval worsening ventilation at the lung bases with reticulonodular density which could be either infection or interstitial edema. Meanwhile left upper lobe perihilar patchy opacity has regressed.  Original Report Authenticated By: Harley Hallmark, M.D.   Dg Chest Portable 1 View  09/04/2011   *RADIOLOGY REPORT*  Clinical Data: Chest pain, shortness of breath  PORTABLE CHEST - 1 VIEW  Comparison: 08/24/2011  Findings: Interval increase in mild diffuse interstitial edema. Patchy airspace opacities at the lung bases, left greater than right, slightly increased.  Suspect small right pleural effusion. Mild cardiomegaly stable.  Atheromatous aortic arch.  IMPRESSION: 1.  Interval increase in bilateral edema or infiltrates. 2.  Suspect small right pleural effusion.  Original Report Authenticated By: Osa Craver, M.D.    Assessment/Plan Present on Admission:  . acute on chronic dyspnea: Multifactorial likely secondary to Diastolic CHF, acute on chronic,  COPD with pulmonary hypertension, recent bronchitis - Admit to medicine service, rule out for acute ACS with serial cardiac enzymes, stat d-dimer. If positive will obtain CT angiogram of the chest to rule out any pulmonary embolism, but my suspicion is low due to therapeutic INR. - Admit on heart failure pathway, place on aspirin, beta blocker, ACEI, nitroglycerin and IV diuresis with Lasix. - Obtain 12-lead EKG, 2-D echocardiogram for further workup and comparison. If patient has significant drop in the EF compared to the previous echo in March 2012, he may need a repeat cardiac catheterization. He had lexiscan Myoview in April 2012 which had shown old small/moderate infero-apical scar but no ischemia. Previous cath was in July 2008 which had shown moderate pulmonary hypertension but no coronary disease.  - Placed on low salt diet, strict I.'s and O's, daily weights. Cardiology has been consulted. - Obtain 2 view chest x-ray in a.m. as there is a question of interval increase in bilateral edema versus infiltrates. Patient has no fevers or chills, leukocytosis, any URI symptoms at present.  .Permanent atrial fibrillation with RVR: - Continue Cardizem and beta blocker, digoxin for rate control, Coumadin per pharmacy. INR is therapeutic  currently   Hypertension: Place on all by mouth antihypertensives on outpatient dose.  Hyperlipidemia: Obtain lipid panel and continue statins  Bronchitis/COPD: Place on Xopenex and Atrovent nebs, O2, fluticasone. Place on doxycycline. Patient's pulmonologist, [name] will be consulted if no significant improvement in pulmonary status.  Diabetes mellitus: Obtain HbA1c, place on sliding scale insulin, continue glyburide and tradjenta  DVT prophylaxis: On Coumadin  CODE STATUS: I discussed in detail with the patient himself, he wishes to be full CODE STATUS  @Time  Spent on Admission: 1 hour  Trai Ells 09/04/2011, 6:09 PM

## 2011-09-04 NOTE — Progress Notes (Signed)
ANTICOAGULATION CONSULT NOTE - Initial Consult  Pharmacy Consult for Afib Indication: atrial fibrillation  Allergies  Allergen Reactions  . Avelox (Moxifloxacin Hydrochloride) Itching    Headache  . Tadalafil     REACTION: HA, backache    Patient Measurements: Height: 5\' 9"  (175.3 cm) Weight: 212 lb 15.4 oz (96.6 kg) IBW/kg (Calculated) : 70.7  Adjusted Body Weight:    Vital Signs: Temp: 96.6 F (35.9 C) (11/24 1715) Temp src: Oral (11/24 1715) BP: 155/90 mmHg (11/24 1715) Pulse Rate: 95  (11/24 1715)  Labs:  Basename 09/04/11 1200  HGB 14.0  HCT 40.7  PLT 225  APTT 46*  LABPROT 25.8*  INR 2.31*  HEPARINUNFRC --  CREATININE 0.70  CKTOTAL 30  CKMB 2.7  TROPONINI <0.30   Estimated Creatinine Clearance: 98.6 ml/min (by C-G formula based on Cr of 0.7).  Medical History: Past Medical History  Diagnosis Date  . HTN (hypertension)     at some point hard to control- saw Dr Lowell Guitar  . HLD (hyperlipidemia)   . DM (diabetes mellitus) 1999    adult onset  . Permanent atrial fibrillation     Dr. Ladona Ridgel  . BPH (benign prostatic hyperplasia)     saw Dr [name] 2004, normal renal u/s  . Leg ulcer 2011  . Chronic diastolic heart failure     Echocardiogram 12/25/10: EF 45-50%; moderate LVH; global hypokinesis; moderate biatrial enlargement; PASP 36;    Cardiac catheterization July 2008: No CAD; moderate pulmonary hypertension  . Pleural effusion 2008    Status post decortication  . Insomnia     Medications:  Prescriptions prior to admission  Medication Sig Dispense Refill  . albuterol (PROVENTIL HFA;VENTOLIN HFA) 108 (90 BASE) MCG/ACT inhaler Inhale 2 puffs into the lungs every 6 (six) hours as needed for wheezing.  1 Inhaler  0  . aspirin 325 MG tablet Take 325 mg by mouth daily.        Marland Kitchen dextromethorphan-guaiFENesin (MUCINEX DM) 30-600 MG per 12 hr tablet Take 1 tablet by mouth as needed. For congestion      . digoxin (LANOXIN) 0.25 MG tablet Take 1 tablet (250  mcg total) by mouth daily.  90 tablet  3  . diltiazem (CARDIZEM CD) 180 MG 24 hr capsule Take 1 capsule (180 mg total) by mouth daily.  90 capsule  3  . famotidine (PEPCID) 20 MG tablet Take 20 mg by mouth at bedtime.        . fluticasone (FLONASE) 50 MCG/ACT nasal spray Place 1 spray into the nose daily.        . furosemide (LASIX) 40 MG tablet Take 40 mg by mouth daily.       Marland Kitchen glyBURIDE (DIABETA) 5 MG tablet Take 10 mg by mouth 2 (two) times daily with a meal.        . losartan (COZAAR) 100 MG tablet Take 1 tablet (100 mg total) by mouth daily.  90 tablet  3  . metFORMIN (GLUCOPHAGE) 850 MG tablet Take 1 tablet (850 mg total) by mouth 2 (two) times daily with a meal.  180 tablet  3  . metoprolol (LOPRESSOR) 50 MG tablet Take 25 mg by mouth 2 (two) times daily.       . Multiple Vitamin (MULTIVITAMIN) tablet Take 1 tablet by mouth daily.        . niacin-lovastatin (ADVICOR) 1000-20 MG 24 hr tablet Take 1 tablet by mouth at bedtime.        . pantoprazole (PROTONIX)  40 MG tablet Take 40 mg by mouth every morning.        . sitaGLIPtin (JANUVIA) 100 MG tablet Take 1 tablet (100 mg total) by mouth daily.  90 tablet  3  . warfarin (COUMADIN) 5 MG tablet Take 5 mg by mouth at bedtime.       Marland Kitchen zolpidem (AMBIEN) 10 MG tablet Take 10 mg by mouth at bedtime.       Marland Kitchen DISCONTD: fluticasone (FLONASE) 50 MCG/ACT nasal spray Place 2 sprays into the nose daily.  16 g  2  . DISCONTD: glyBURIDE (DIABETA) 5 MG tablet Take 4 tablets (20 mg total) by mouth daily.  360 tablet  3  . DISCONTD: niacin-lovastatin (ADVICOR) 1000-20 MG 24 hr tablet Take 1 tablet by mouth daily.  90 tablet  3  . clobetasol (TEMOVATE) 0.05 % cream Apply topically as directed.        Marland Kitchen HYDROcodone-acetaminophen (NORCO) 5-325 MG per tablet Take 1 tablet by mouth every 4 (four) hours as needed. For pain        Assessment: Chronic Coumadin for afib with therapeutic INR in goal 2-3. CBC normal.   Goal of Therapy:  INR 2-3   Plan:    Continue home Coumadin regimen 5mg  daily. Daily PT/INR.  Merilynn Finland, [employer] 09/04/2011,6:51 PM

## 2011-09-04 NOTE — Consult Note (Signed)
Primary inpatient team: [facility]  Primary cardiologist: Dr. Lewayne Bunting [facility])  Patient location: Room [phone]  Reason for consult:management of atrial fibrillation and heart failure  HPI:  Bradley [name] is a 70 y.o. gentleman with a history of congestive heart failure, atrial fibrillation on chronic anticoagulation, hypertension, diabetes, and hyperlipidemia who was admitted to the Hospitalist service this evening for shortness of breath over the past 2-3 weeks.  He was last hospitalized in mid-October and treated presumptively for pneumonia. Since that admission, he has been treated with two additional courses of antibiotics for presumptive bronchitis. Unfortunately, he feels no better. He complains primary of shortness of breath, both at night in the form of paroxysmal nocturnal dyspnea and orthopnea. During the day, he can walk about 20-30 yards before having to stop due to dyspnea. This is a marked change when compared to just a few months ago when he was reportedly going to the gym and walking regularly on the treadmill. He feels his abdominal girth has increased over the past few weeks and his appetite has diminished. No leg swelling but of note his legs rarely swell, even during past heart failure exacerbations.   He denies chest pain and denies palpitations. He denies nausea, vomiting, diarrhea, fevers, chills, diaphoresis. No bleeding or bruising issues in recent past.    Past Medical History Atrial Fibrillation - chronic Warfarin therapy Congestive heart failure: thought to be primarily non-systolic (EF 45-50% in 12/2010)  - LHCath 2008: reportedly no significant CAD  - Nuc stress (01/2011): evidence of old small/moderate infero-apical scar. Wall motion not assessed. No evident ischemia. Hypertension Hyperlipidemia Diabetes Mellitus BPH COPD   Social History Remote tobacco use occassional EtOH  Family History No familial cardiomyopathy Premature CAD- brother  reportedly had MI in his 30s  Review of Systems As per HPI, otherwise is comprehensively negative  Allergies: Avelox, Tadalafil  Medications: ASA 325mg  Warfarin 5mg  Metoprolol 25mg  BID Diltiazem 180 (extended release)  Digoxin 0.25mg  daily Losartan 100mg  Furosemide 40mg  daily Niacin/Lovastatin 1000mg /20mg   Albuterol Inh Fluticasone 50 mcg nasal spray Glyburide 10mg  BID Metformin 850mg  BID Januvia 100mg  Pantoprazole 40mg  Famotidine 20mg   Zolpidem 10mg  PRN  Physical Exam Afebrile HR 95 BP 155/90 RR 16 96% 2L [place] GEN: no acute distress, sitting up on edge of bed, breathing comfortably without accessory muscles NECK; supple, JVP 12-14 cm H20 CHEST: good air mvmt bilaterally; no wheezing, scattered crackles in right base and in lower 1/3 left lung field CARDIAC: irregular rate, S1 S2, tachycardia ABDOMEN: soft, mildly distended, non-pulsatile liver, no focal tenderness, no bruits EXTREMITIES: warm, well -perfused, no edema, 2+ DP pulses bilaterally, chronic skin changes lower extremities below knee NEURO: A&Ox3, no focal deficits detected SKIN: warm & dry  Imaging Chest x-ray: pulm vasc congestion EKG: atrial fibrillation  Labs POC BNP 1595 Trop < 0.30 Na 139, K 4.2, Cl 103, CO2 28, BUN 13, Cr 0.7   Assessment: This is a 70 year old gentleman with atrial fibrillation and a history of heart failure with reasonably preserved ejection fraction who now presents with a clinical history and physical exam highly suggestive of acute decompensated heart failure (ADHF). He is warm (perfusing both on exam and by laboratory markers of end-organ perfusion) and wet (volume overloaded) on exam.   Plan & Recommendations:  1) ADHF: shortness of breath likely due to a clinical history and exam suggestive of acute decompensated heart failure. He is warm (perfusing) and wet (volume overloaded) on exam.  - Recommend IV loop diuretics with  close monitoring of his electrolytes (K).  Electrolyte repletion as necessary. Given his home dose of 40mg  PO daily, would be reasonable to start with a trial of Lasix 40mg  IV q8-12h with precise quantification of his I/Os and daily weights.  - I think it is fine to continue his beta-blocker and calcium channel blocker, since he is warm & perfusing, and since he likely benefits from good rate control (and avoidance of RVR in the setting of his AFib). If he does not respond to escalating doses of loop diuretic, would then consider temporary holding of his metoprolol until he is closer to euvolemia.  - Continue ARB for afterload reduction. - Repeating a TTE would be reasonable during this admission given the marked clinical change he has experienced (dyspnea) relative to a few months ago, without a clearly obvious explanation for his deterioration. He appears to be quite medically adherent. - Would discontinue nitroglycerine patch given his headache and given that his presentation is not suspicious for acute coronary syndrome. - Would strongly urge not subjecting him to a contrast load to look for PE via CT for a few reasons: he is at low risk for PE and lacks a persuasive history for this diagnosis; we have a much more likely explanation for his dyspnea (in the form of ADHF); he is already adequately anticoagulated and finding a PE would not change management; it would be optimal to minimize potentially nephro-toxic insults in the setting of ADHF and his need for IV diuresis. I would thus strongly urge the primary team to ignore whatever his D-dimer result is.   2) Atrial fibrillation: adequately rate-controlled on current regimen, with RVR occurring with exertion - Continue metoprolol and diltiazem at current dosing - His current propensity for RVR is likely a consequence of his volume overload and ADHF. Rate control will be achieved via two parallel approaches: pharmacologic rate control, as well as removal of his primary acute trigger (volume  overload). - Warfarin is therapeutic with INR of 2.3. Continue home dosing  I discussed my impressions with the patient and over the phone with his wife. They both voiced understanding and I answered their questions to the best of my ability. I also relayed my recommendations to the primary medical team.  Thank you for involving [place] in the care of this patient. We will follow along with you.   Please call with any questions/concerns. Over night pager is [phone].  Zacarias Pontes, M.D. Cardiology Fellow On-Call

## 2011-09-04 NOTE — ED Notes (Signed)
Report called to [phone] to Alba, RN

## 2011-09-05 ENCOUNTER — Other Ambulatory Visit: Payer: Self-pay

## 2011-09-05 ENCOUNTER — Inpatient Hospital Stay (HOSPITAL_COMMUNITY): Payer: Medicare Other

## 2011-09-05 DIAGNOSIS — R634 Abnormal weight loss: Secondary | ICD-10-CM

## 2011-09-05 DIAGNOSIS — I5033 Acute on chronic diastolic (congestive) heart failure: Secondary | ICD-10-CM

## 2011-09-05 LAB — CARDIAC PANEL(CRET KIN+CKTOT+MB+TROPI)
CK, MB: 2.5 ng/mL (ref 0.3–4.0)
CK, MB: 2.5 ng/mL (ref 0.3–4.0)
Relative Index: INVALID (ref 0.0–2.5)
Relative Index: INVALID (ref 0.0–2.5)
Total CK: 29 U/L (ref 7–232)
Total CK: 23 U/L (ref 7–232)
Troponin I: <0.3 ng/mL (ref <0.30)
Troponin I: <0.3 ng/mL (ref <0.30)

## 2011-09-05 LAB — GLUCOSE, CAPILLARY
Glucose-Capillary: 92 mg/dL (ref 70–99)
Glucose-Capillary: 168 mg/dL — ABNORMAL HIGH (ref 70–99)
Glucose-Capillary: 126 mg/dL — ABNORMAL HIGH (ref 70–99)
Glucose-Capillary: 101 mg/dL — ABNORMAL HIGH (ref 70–99)

## 2011-09-05 LAB — CBC
HCT: 40 % (ref 39.0–52.0)
Hemoglobin: 13.4 g/dL (ref 13.0–17.0)
MCH: 30.3 pg (ref 26.0–34.0)
MCHC: 33.5 g/dL (ref 30.0–36.0)
MCV: 90.5 fL (ref 78.0–100.0)
Platelets: 209 10*3/uL (ref 150–400)
RBC: 4.42 MIL/uL (ref 4.22–5.81)
RDW: 14.4 % (ref 11.5–15.5)
WBC: 9.1 10*3/uL (ref 4.0–10.5)

## 2011-09-05 LAB — BASIC METABOLIC PANEL
BUN: 15 mg/dL (ref 6–23)
CO2: 33 mEq/L — ABNORMAL HIGH (ref 19–32)
Calcium: 8.9 mg/dL (ref 8.4–10.5)
Chloride: 101 mEq/L (ref 96–112)
Creatinine, Ser: 0.74 mg/dL (ref 0.50–1.35)
GFR calc Af Amer: >90 mL/min (ref >90)
GFR calc non Af Amer: >90 mL/min (ref >90)
Glucose, Bld: 114 mg/dL — ABNORMAL HIGH (ref 70–99)
Potassium: 3.7 mEq/L (ref 3.5–5.1)
Sodium: 140 mEq/L (ref 135–145)

## 2011-09-05 LAB — PROTIME-INR
INR: 1.93 — ABNORMAL HIGH (ref 0.00–1.49)
Prothrombin Time: 22.4 seconds — ABNORMAL HIGH (ref 11.6–15.2)

## 2011-09-05 LAB — DIGOXIN LEVEL: Digoxin Level: <0.3 ng/mL — ABNORMAL LOW (ref 0.8–2.0)

## 2011-09-05 LAB — HEMOGLOBIN A1C
Hgb A1c MFr Bld: 6 % — ABNORMAL HIGH (ref <5.7)
Mean Plasma Glucose: 126 mg/dL — ABNORMAL HIGH (ref <117)

## 2011-09-05 MED ORDER — WARFARIN SODIUM 6 MG PO TABS
6.0000 mg | ORAL_TABLET | Freq: Once | ORAL | Status: AC
  Administered 2011-09-05: 6 mg via ORAL
  Filled 2011-09-05: qty 1

## 2011-09-05 MED ORDER — METOPROLOL TARTRATE 50 MG PO TABS
50.0000 mg | ORAL_TABLET | Freq: Two times a day (BID) | ORAL | Status: DC
  Administered 2011-09-05 – 2011-09-06 (×3): 50 mg via ORAL
  Filled 2011-09-05 (×4): qty 1

## 2011-09-05 NOTE — Plan of Care (Signed)
Problem: Phase I Progression Outcomes Goal: EF % per last Echo/documented,Core Reminder form on chart 45-50% ON 03/12

## 2011-09-05 NOTE — Progress Notes (Signed)
Patient Name: Bradley [name]    ASSESSMENT AND PLAN:    Patient Active Hospital Problem List: Diastolic CHF, acute on chronic (09/04/2011)   Assessment:  Improved with diuresis    Plan: continue iv lasix x 24 hrs; await echo Permanent atrial fibrillation (01/02/2007)   Assessment: rates still excessive  The issues are slowing it down and why   Review shows low TSH 2 years ago , alsohe and his wife are both a little under the weather, but affebrile   Plan: will recheck TSH  Increase metoprolol  Check dig level Weight loss (09/05/2011)   Assessment: 20 lbs in year with recent night sweats.     Plan: will check ESR and defer to PCP HYPERTENSION (12/13/2007)   Assessment: elevat5ed     Plan: increase betablocker Long term current use of anticoagulant (11/24/2010)   Assessment: continue   Plan: as avbvoe       SUBJECTIVE: feels much beetter  Night sweats ofver the last few days  Weight loss over last 12  Months with some effort but not clearly enuf to explain     PHYSICAL EXAM Filed Vitals:   09/04/11 2134 09/05/11 0135 09/05/11 0147 09/05/11 0633  BP: 153/88 134/74  146/100  Pulse: 77 104  116  Temp: 97.6 F (36.4 C) 98 F (36.7 C)  97.7 F (36.5 C)  TempSrc: Oral Oral  Oral  Resp: 20 18  19   Height:      Weight:    205 lb 11 oz (93.3 kg)  SpO2: 94% 95% 95% 97%    General appearance: alert, cooperative, appears stated age and no distress Neck: JVD - 8 cm above sternal notch, no carotid bruit, no JVD and supple, symmetrical, trachea midline Lungs: clear to auscultation bilaterally Heart: irregularly irregular rhythm and rate rapid Abdomen: soft, non-tender; bowel sounds normal; no masses,  no organomegaly Extremities: extremities normal, atraumatic, no cyanosis or edema and venous stasis dermatitis noted Pulses: 2+ and symmetric Skin: Skin color, texture, turgor normal. No rashes or lesions Neurologic: Alert and oriented X 3, normal strength and tone. Normal symmetric  reflexes. Normal coordination and gait  TELEMETRY: Reviewed telemetry pt in  afib with rates 80-160:    Intake/Output Summary (Last 24 hours) at 09/05/11 0929 Last data filed at 09/05/11 0801  Gross per 24 hour  Intake    943 ml  Output   2225 ml  Net  -1282 ml    LABS: Basic Metabolic Panel:  Lab 09/05/11 1610 09/04/11 1200  NA 140 139  K 3.7 4.2  CL 101 103  CO2 33* 28  GLUCOSE 114* 162*  BUN 15 13  CREATININE 0.74 0.70  CALCIUM 8.9 9.0  MG -- --  PHOS -- --   Cardiac Enzymes:  Basename 09/05/11 0600 09/04/11 2340 09/04/11 1759  CKTOTAL 23 29 30   CKMB 2.5 2.5 2.6  CKMBINDEX -- -- --  TROPONINI <0.30 <0.30 <0.30   CBC:  Lab 09/05/11 0600 09/04/11 1200  WBC 9.1 10.9*  NEUTROABS -- --  HGB 13.4 14.0  HCT 40.0 40.7  MCV 90.5 88.9  PLT 209 225   Liver Function Tests:  Basename 09/04/11 1200  AST 18  ALT 35  ALKPHOS 66  BILITOT 1.1  PROT 6.9  ALBUMIN 3.4*   No results found for this basename: LIPASE:2,AMYLASE:2 in the last 72 hours BNP:  Basename 09/04/11 1200  POCBNP 1595.0*   D-Dimer:  Basename 09/04/11 1803  DDIMER <0.22   Hemoglobin A1C:  Basename 09/04/11 1803  HGBA1C 6.0*   Fasting Lipid Panel:  Basename 09/04/11 1940  CHOL 129  HDL 40  LDLCALC 68  TRIG 106  CHOLHDL 3.2  LDLDIRECT --   Thyroid Function Tests: No results found for this basename: TSH,T4TOTAL,FREET3,T3FREE,THYROIDAB in the last 72 hours Anemia Panel: No results found for this basename: VITAMINB12,FOLATE,FERRITIN,TIBC,IRON,RETICCTPCT in the last 72 hours   Signed, Sherryl Manges MD  09/05/2011

## 2011-09-05 NOTE — Progress Notes (Signed)
  Echocardiogram 2D Echocardiogram has been performed.  Cathie Beams Deneen 09/05/2011, 2:01 PM

## 2011-09-05 NOTE — Progress Notes (Signed)
Physical Therapy Evaluation Patient Details Name: Bradley [name] MRN: [id number] DOB: 05/14/1941 Today's Date: 09/05/2011  Problem List:  Patient Active Problem List  Diagnoses  . AODM  . HYPERLIPIDEMIA  . ERECTILE DYSFUNCTION  . INSOMNIA, CHRONIC  . HYPERTENSION  . PULMONARY HYPERTENSION  . Permanent atrial fibrillation  . DERMATITIS, STASIS  . REACTIVE AIRWAY DISEASE  . BENIGN PROSTATIC HYPERTROPHY  . DYSPNEA ON EXERTION  . PSA, INCREASED  . CLAUDICATION  . ULCER, LEG  . Long term current use of anticoagulant  . Chronic diastolic heart failure  . Chest pain  . Pneumonia  . Diastolic CHF, acute on chronic  . Diabetes mellitus  . Weight loss    Past Medical History:  Past Medical History  Diagnosis Date  . HTN (hypertension)     at some point hard to control- saw Dr Lowell Guitar  . HLD (hyperlipidemia)   . DM (diabetes mellitus) 1999    adult onset  . BPH (benign prostatic hyperplasia)     saw Dr [name] 2004, normal renal u/s  . Leg ulcer 2011  . Chronic diastolic heart failure     Echocardiogram 12/25/10: EF 45-50%; moderate LVH; global hypokinesis; moderate biatrial enlargement; PASP 36;    Cardiac catheterization July 2008: No CAD; moderate pulmonary hypertension  . Pleural effusion 2008    Status post decortication  . Insomnia   . Permanent atrial fibrillation     Dr. Ladona Ridgel  . Headache    Past Surgical History:  Past Surgical History  Procedure Date  . Pleural scarification   . Lung decortication   . Cardiac catheterization   . Colonoscopy 03/10/11    normal  . Pneumothorax with fibrothorax     PT Assessment/Plan/Recommendation PT Assessment Clinical Impression Statement: Patient is 70 yo male admitted with CHF. Patient is independent in all mobility/gait.  Balance is WNL.  No acute PT need identified.  PT will sign off. Patient in agreement.  Spoke with RN-patient safe to walk in hallway independently. PT Recommendation/Assessment: Patent does not need  any further PT services No Skilled PT: Patient is independent with all acitivity/mobility PT Goals  None   PT Evaluation Precautions/Restrictions  Precautions Precaution Comments: None Prior Functioning  Home Living Lives With: Spouse Type of Home: House (Townhouse) Home Layout: Multi-level;Bed/bath upstairs (Bedrooms 3rd floor; Kitchen 2nd floor; Bathrooms all floors) Alternate Level Stairs-Rails: Right Alternate Level Stairs-Number of Steps: flight Home Access: Stairs to enter Entrance Stairs-Number of Steps: 2 Bathroom Shower/Tub: Engineer, manufacturing systems: Standard Home Adaptive Equipment: Straight cane Prior Function Level of Independence: Independent with basic ADLs;Independent with homemaking with ambulation;Independent with gait Driving: Yes Vocation: Retired Producer, television/film/video: Awake/alert Overall Cognitive Status: Appears within functional limits for tasks assessed Sensation/Coordination Sensation Light Touch: Appears Intact Coordination Gross Motor Movements are Fluid and Coordinated: Yes Extremity Assessment RUE Assessment RUE Assessment: Within Functional Limits LUE Assessment LUE Assessment: Within Functional Limits RLE Assessment RLE Assessment: Within Functional Limits LLE Assessment LLE Assessment: Within Functional Limits Mobility (including Balance) Bed Mobility Bed Mobility: Yes Supine to Sit: 7: Independent Sitting - Scoot to Edge of Bed: 7: Independent Transfers Transfers: Yes Sit to Stand: 7: Independent;With upper extremity assist;From bed Stand to Sit: 7: Independent;Without upper extremity assist;To bed Ambulation/Gait Ambulation/Gait: Yes Ambulation/Gait Assistance: 7: Independent Ambulation Distance (Feet): 225 Feet Assistive device: None Gait Pattern: Within Functional Limits Gait velocity: Good gait speed Stairs: No  Posture/Postural Control Posture/Postural Control: No significant  limitations Balance Balance Assessed: Yes High Level Balance  High Level Balance Activites: Direction changes;Turns;Sudden stops;Head turns High Level Balance Comments: No loss of balance with high level balance activities   End of Session PT - End of Session Activity Tolerance: Patient tolerated treatment well (No dyspnea.  O2 sats dropped - recovered in < 1 minute) Patient left: in bed;with call bell in reach (sitting EOB for dinner) Nurse Communication: Mobility status for ambulation General Behavior During Session: [facility] for tasks performed Cognition: [facility] for tasks performed  Vena Austria [phone] 09/05/2011, 5:40 PM

## 2011-09-05 NOTE — Progress Notes (Signed)
Subjective: S: Shortness of breath improving today, no wheezing. Heart rate controlled, afebrile  Objective: Weight change:   Intake/Output Summary (Last 24 hours) at 09/05/11 1014 Last data filed at 09/05/11 0801  Gross per 24 hour  Intake    943 ml  Output   2225 ml  Net  -1282 ml   Blood pressure 158/89, pulse 97, temperature 98.2 F (36.8 C), temperature source Oral, resp. rate 18, height 5\' 9"  (1.753 m), weight 93.3 kg (205 lb 11 oz), SpO2 97.00%.  Physical Exam: General: Alert and awake, oriented x3, not in any acute distress. HEENT: anicteric sclera, pupils reactive to light and accommodation, EOMI CVS: S1-S2 clear, no murmur rubs or gallops, irregular rhythm Chest: clear to auscultation bilaterally, no wheezing, rales or rhonchi Abdomen: soft nontender, nondistended, normal bowel sounds, no organomegaly Extremities: no cyanosis, clubbing or edema noted bilaterally Neuro: Cranial nerves II-XII intact, no focal neurological deficits  Lab Results: Basic Metabolic Panel:  Lab 09/05/11 1610 09/04/11 1200  NA 140 139  K 3.7 4.2  CL 101 103  CO2 33* 28  GLUCOSE 114* 162*  BUN 15 13  CREATININE 0.74 0.70  CALCIUM 8.9 9.0  MG -- --  PHOS -- --   Liver Function Tests:  Lab 09/04/11 1200  AST 18  ALT 35  ALKPHOS 66  BILITOT 1.1  PROT 6.9  ALBUMIN 3.4*   No results found for this basename: LIPASE:2,AMYLASE:2 in the last 168 hours No results found for this basename: AMMONIA:2 in the last 168 hours CBC:  Lab 09/05/11 0600 09/04/11 1200  WBC 9.1 10.9*  NEUTROABS -- --  HGB 13.4 14.0  HCT 40.0 40.7  MCV 90.5 88.9  PLT 209 225   Cardiac Enzymes:  Lab 09/05/11 0600 09/04/11 2340 09/04/11 1759  CKTOTAL 23 29 30   CKMB 2.5 2.5 2.6  CKMBINDEX -- -- --  TROPONINI <0.30 <0.30 <0.30   BNP:  Lab 09/04/11 1200  POCBNP 1595.0*   CBG:  Lab 09/05/11 0609 09/04/11 2135 09/04/11 1644  GLUCAP 92 125* 77     Micro Results: No results found for this or any  previous visit (from the past 240 hour(s)).  Studies/Results: Dg Chest 2 View  09/05/2011  *RADIOLOGY REPORT*  Clinical Data: 70 year old male with shortness of breath and CHF exacerbation.  CHEST - 2 VIEW  Comparison: 09/04/2011 and prior chest radiographs  Findings: Cardiomegaly, pulmonary vascular congestion and mild interstitial pulmonary edema are again noted. A very small right pleural effusion is unchanged. There is no evidence of pneumothorax. Little interval change is noted since the prior study.  IMPRESSION: Stable chest radiograph with cardiomegaly, interstitial pulmonary edema and very small right pleural effusion.  Original Report Authenticated By: Rosendo Gros, M.D.   Dg Chest 2 View  08/24/2011  *RADIOLOGY REPORT*  Clinical Data: Cough.  Shortness of breath and congestion  CHEST - 2 VIEW  Comparison: 08/16/2011  Findings: The heart size appears moderately enlarged.  There is no pleural effusion identified. There has been interval resolution of previously noted interstitial edema.  Diffuse, coarsened interstitial markings are noted bilaterally consistent with chronic interstitial lung disease.  No focal areas of airspace consolidation noted.  IMPRESSION:  1.  Chronic interstitial lung disease, similar to previous examinations. 2.  Resolution of edema.  Original Report Authenticated By: Rosealee Albee, M.D.   Dg Chest 2 View  08/16/2011  *RADIOLOGY REPORT*  Clinical Data: 70 year old male with cough, shortness of breath, chest pain.  CHEST - 2  VIEW  Comparison: 07/31/2011 and earlier.  Findings: Interval progression of basilar predominant reticular nodular bilateral pulmonary opacity.  There remains trace fluid in the fissures. Stable cardiomegaly and mediastinal contours.  No consolidation.  Left perihilar patchy opacity has diminished. Stable visualized osseous structures.  IMPRESSION: Interval worsening ventilation at the lung bases with reticulonodular density which could be either  infection or interstitial edema. Meanwhile left upper lobe perihilar patchy opacity has regressed.  Original Report Authenticated By: Harley Hallmark, M.D.   Dg Chest Portable 1 View  09/04/2011  *RADIOLOGY REPORT*  Clinical Data: Chest pain, shortness of breath  PORTABLE CHEST - 1 VIEW  Comparison: 08/24/2011  Findings: Interval increase in mild diffuse interstitial edema. Patchy airspace opacities at the lung bases, left greater than right, slightly increased.  Suspect small right pleural effusion. Mild cardiomegaly stable.  Atheromatous aortic arch.  IMPRESSION: 1.  Interval increase in bilateral edema or infiltrates. 2.  Suspect small right pleural effusion.  Original Report Authenticated By: Osa Craver, M.D.    Medications: Scheduled Meds:   . aspirin  325 mg Oral Daily  . dextromethorphan-guaiFENesin  1 tablet Oral BID  . digoxin  250 mcg Oral Daily  . diltiazem  180 mg Oral Daily  . diltiazem  10 mg Intravenous Once  . doxycycline  100 mg Oral Q12H  . famotidine  20 mg Oral QHS  . fluticasone  1 spray Each Nare Daily  . furosemide  40 mg Intravenous Once  . furosemide  40 mg Intravenous Q12H  . glyBURIDE  10 mg Oral BID WC  . insulin aspart  0-5 Units Subcutaneous QHS  . insulin aspart  0-9 Units Subcutaneous TID WC  . ipratropium  0.5 mg Nebulization Q6H  . levalbuterol  0.63 mg Nebulization Q6H  . linagliptin  5 mg Oral Daily  . losartan  100 mg Oral Daily  . metoprolol  50 mg Oral BID  .  morphine injection  2 mg Intravenous Once  . multivitamin  1 tablet Oral Daily  . niacin  1,000 mg Oral QHS  . nitroGLYCERIN  1 inch Topical Once  . pantoprazole  40 mg Oral Q1200  . simvastatin  10 mg Oral QHS  . sodium chloride  3 mL Intravenous Q12H  . warfarin  6 mg Oral ONCE-1800  . zolpidem  10 mg Oral QHS  . DISCONTD: aspirin  324 mg Oral Once  . DISCONTD: diltiazem  180 mg Oral Daily  . DISCONTD: lovastatin  20 mg Oral QHS  . DISCONTD: metoprolol  25 mg Oral BID    . DISCONTD: niacin-lovastatin  1 tablet Oral QHS  . DISCONTD: niacin-lovastatin  1 tablet Oral Daily  . DISCONTD: warfarin  5 mg Oral q1800   Continuous Infusions:   . sodium chloride Stopped (09/04/11 1533)   PRN Meds:.acetaminophen, HYDROcodone-acetaminophen, nitroGLYCERIN, ondansetron (ZOFRAN) IV, sodium chloride, sodium chloride  Assessment/Plan:  Principal Problem:  *Diastolic CHF, acute on chronic - Continue on heart failure pathway, on aspirin, beta blocker, ACEI and IV diuresis with Lasix. Continue daily weights, strict I.'s and O's. -  2-D echocardiogram pending. If patient has significant drop in the EF compared to the previous echo in March 2012, he may need a repeat cardiac catheterization. He had lexiscan Myoview in April 2012 which had shown old small/moderate infero-apical scar but no ischemia. Previous cath was in July 2008 which had shown moderate pulmonary hypertension but no coronary disease.  - Placed on low salt diet, strict  I.'s and O's, daily weights. Cardiology following.  Active Problems: .Permanent atrial fibrillation with RVR:  - Continue Cardizem, digoxin for rate control, Coumadin per pharmacy.  -  Lopressor increased today for BP and rate control  Hypertension: Continue losartan, Lasix, Cardizem, Lopressor  Hyperlipidemia: LDL 68 at goal. 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  - Patient's pulmonologist, [name] will be consulted if pulmonary status worsens.   Diabetes mellitus: HbA1c 6.0, blood sugar is well controlled - on sliding scale insulin, continue glyburide and tradjenta   DVT prophylaxis: On Coumadin    LOS: 1 day   Kaylene Dawn 09/05/2011, 10:14 AM

## 2011-09-05 NOTE — Progress Notes (Signed)
ANTICOAGULATION CONSULT NOTE - Follow Up Consult  Pharmacy Consult for Coumadin Indication: atrial fibrillation  Allergies  Allergen Reactions  . Avelox (Moxifloxacin Hydrochloride) Itching    Headache  . Tadalafil     REACTION: HA, backache    Patient Measurements: Height: 5\' 9"  (175.3 cm) Weight: 205 lb 11 oz (93.3 kg) IBW/kg (Calculated) : 70.7  Adjusted Body Weight:    Vital Signs: Temp: 97.7 F (36.5 C) (11/25 0633) Temp src: Oral (11/25 1610) BP: 146/100 mmHg (11/25 0633) Pulse Rate: 116  (11/25 0633)  Labs:  Basename 09/05/11 0600 09/04/11 2340 09/04/11 1759 09/04/11 1200  HGB 13.4 -- -- 14.0  HCT 40.0 -- -- 40.7  PLT 209 -- -- 225  APTT -- -- -- 46*  LABPROT 22.4* -- -- 25.8*  INR 1.93* -- -- 2.31*  HEPARINUNFRC -- -- -- --  CREATININE 0.74 -- -- 0.70  CKTOTAL 23 29 30  --  CKMB 2.5 2.5 2.6 --  TROPONINI <0.30 <0.30 <0.30 --   Estimated Creatinine Clearance: 96.9 ml/min (by C-G formula based on Cr of 0.74).  Medical History: Past Medical History  Diagnosis Date  . HTN (hypertension)     at some point hard to control- saw Dr Lowell Guitar  . HLD (hyperlipidemia)   . DM (diabetes mellitus) 1999    adult onset  . BPH (benign prostatic hyperplasia)     saw Dr [name] 2004, normal renal u/s  . Leg ulcer 2011  . Chronic diastolic heart failure     Echocardiogram 12/25/10: EF 45-50%; moderate LVH; global hypokinesis; moderate biatrial enlargement; PASP 36;    Cardiac catheterization July 2008: No CAD; moderate pulmonary hypertension  . Pleural effusion 2008    Status post decortication  . Insomnia   . Permanent atrial fibrillation     Dr. Ladona Ridgel  . Headache     Medications:  Prescriptions prior to admission  Medication Sig Dispense Refill  . albuterol (PROVENTIL HFA;VENTOLIN HFA) 108 (90 BASE) MCG/ACT inhaler Inhale 2 puffs into the lungs every 6 (six) hours as needed for wheezing.  1 Inhaler  0  . aspirin 325 MG tablet Take 325 mg by mouth daily.          Marland Kitchen dextromethorphan-guaiFENesin (MUCINEX DM) 30-600 MG per 12 hr tablet Take 1 tablet by mouth as needed. For congestion      . digoxin (LANOXIN) 0.25 MG tablet Take 1 tablet (250 mcg total) by mouth daily.  90 tablet  3  . diltiazem (CARDIZEM CD) 180 MG 24 hr capsule Take 1 capsule (180 mg total) by mouth daily.  90 capsule  3  . famotidine (PEPCID) 20 MG tablet Take 20 mg by mouth at bedtime.        . fluticasone (FLONASE) 50 MCG/ACT nasal spray Place 1 spray into the nose daily.        . furosemide (LASIX) 40 MG tablet Take 40 mg by mouth daily.       Marland Kitchen glyBURIDE (DIABETA) 5 MG tablet Take 10 mg by mouth 2 (two) times daily with a meal.        . losartan (COZAAR) 100 MG tablet Take 1 tablet (100 mg total) by mouth daily.  90 tablet  3  . metFORMIN (GLUCOPHAGE) 850 MG tablet Take 1 tablet (850 mg total) by mouth 2 (two) times daily with a meal.  180 tablet  3  . metoprolol (LOPRESSOR) 50 MG tablet Take 25 mg by mouth 2 (two) times daily.       Marland Kitchen  Multiple Vitamin (MULTIVITAMIN) tablet Take 1 tablet by mouth daily.        . niacin-lovastatin (ADVICOR) 1000-20 MG 24 hr tablet Take 1 tablet by mouth at bedtime.        . pantoprazole (PROTONIX) 40 MG tablet Take 40 mg by mouth every morning.        . sitaGLIPtin (JANUVIA) 100 MG tablet Take 1 tablet (100 mg total) by mouth daily.  90 tablet  3  . warfarin (COUMADIN) 5 MG tablet Take 5 mg by mouth at bedtime.       Marland Kitchen zolpidem (AMBIEN) 10 MG tablet Take 10 mg by mouth at bedtime.       Marland Kitchen DISCONTD: fluticasone (FLONASE) 50 MCG/ACT nasal spray Place 2 sprays into the nose daily.  16 g  2  . DISCONTD: glyBURIDE (DIABETA) 5 MG tablet Take 4 tablets (20 mg total) by mouth daily.  360 tablet  3  . DISCONTD: niacin-lovastatin (ADVICOR) 1000-20 MG 24 hr tablet Take 1 tablet by mouth daily.  90 tablet  3  . clobetasol (TEMOVATE) 0.05 % cream Apply topically as directed.        Marland Kitchen HYDROcodone-acetaminophen (NORCO) 5-325 MG per tablet Take 1 tablet by mouth  every 4 (four) hours as needed. For pain        Assessment: 70 yo M with atrial fibrillation on Coumadin PTA.  INR is slightly-below goal at 1.93 likely due to patient not receiving Coumadin dose yesterday.   Goal of Therapy:  INR 2-3   Plan:  1. Coumadin 6mg  po today.  2. F/u AM INR.  Bradley [name] 09/05/2011,9:21 AM

## 2011-09-06 DIAGNOSIS — I428 Other cardiomyopathies: Secondary | ICD-10-CM

## 2011-09-06 LAB — PROTIME-INR
INR: 1.53 — ABNORMAL HIGH (ref 0.00–1.49)
Prothrombin Time: 18.7 seconds — ABNORMAL HIGH (ref 11.6–15.2)

## 2011-09-06 LAB — BASIC METABOLIC PANEL
BUN: 16 mg/dL (ref 6–23)
CO2: 27 mEq/L (ref 19–32)
Calcium: 8.9 mg/dL (ref 8.4–10.5)
Chloride: 101 mEq/L (ref 96–112)
Creatinine, Ser: 0.69 mg/dL (ref 0.50–1.35)
GFR calc Af Amer: >90 mL/min (ref >90)
GFR calc non Af Amer: >90 mL/min (ref >90)
Glucose, Bld: 93 mg/dL (ref 70–99)
Potassium: 3.7 mEq/L (ref 3.5–5.1)
Sodium: 140 mEq/L (ref 135–145)

## 2011-09-06 LAB — CBC
HCT: 39.2 % (ref 39.0–52.0)
Hemoglobin: 13.9 g/dL (ref 13.0–17.0)
MCH: 31.7 pg (ref 26.0–34.0)
MCHC: 35.5 g/dL (ref 30.0–36.0)
MCV: 89.3 fL (ref 78.0–100.0)
Platelets: 205 10*3/uL (ref 150–400)
RBC: 4.39 MIL/uL (ref 4.22–5.81)
RDW: 14.2 % (ref 11.5–15.5)
WBC: 9.3 10*3/uL (ref 4.0–10.5)

## 2011-09-06 LAB — GLUCOSE, CAPILLARY
Glucose-Capillary: 112 mg/dL — ABNORMAL HIGH (ref 70–99)
Glucose-Capillary: 138 mg/dL — ABNORMAL HIGH (ref 70–99)
Glucose-Capillary: 168 mg/dL — ABNORMAL HIGH (ref 70–99)
Glucose-Capillary: 123 mg/dL — ABNORMAL HIGH (ref 70–99)

## 2011-09-06 LAB — SEDIMENTATION RATE: Sed Rate: 35 mm/hr — ABNORMAL HIGH (ref 0–16)

## 2011-09-06 LAB — TSH: TSH: 1.099 u[IU]/mL (ref 0.350–4.500)

## 2011-09-06 MED ORDER — METOPROLOL TARTRATE 50 MG PO TABS
75.0000 mg | ORAL_TABLET | Freq: Two times a day (BID) | ORAL | Status: DC
  Filled 2011-09-06: qty 1

## 2011-09-06 MED ORDER — WARFARIN SODIUM 6 MG PO TABS
6.0000 mg | ORAL_TABLET | Freq: Once | ORAL | Status: AC
  Administered 2011-09-06: 6 mg via ORAL
  Filled 2011-09-06: qty 1

## 2011-09-06 MED ORDER — METOPROLOL TARTRATE 100 MG PO TABS
100.0000 mg | ORAL_TABLET | Freq: Two times a day (BID) | ORAL | Status: DC
  Administered 2011-09-06 – 2011-09-09 (×6): 100 mg via ORAL
  Filled 2011-09-06 (×7): qty 1

## 2011-09-06 NOTE — Progress Notes (Signed)
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 HTN (hypertension)     at some point hard to control- saw Dr Lowell Guitar  . HLD (hyperlipidemia)   . DM (diabetes mellitus) 1999    adult onset  . BPH (benign prostatic hyperplasia)     saw Dr [name] 2004, normal renal u/s  . Leg ulcer 2011  . Chronic diastolic heart failure     Echocardiogram 12/25/10: EF 45-50%; moderate LVH; global hypokinesis; moderate biatrial enlargement; PASP 36;    Cardiac catheterization July 2008: No CAD; moderate pulmonary hypertension  . Pleural effusion 2008    Status post decortication  . Insomnia   . Permanent atrial fibrillation     Dr. Ladona Ridgel  . Headache     PHYSICAL EXAM Filed Vitals:   09/05/11 2133 09/06/11 0553 09/06/11 1110 09/06/11 1353  BP: 148/84 116/72 131/80 118/75  Pulse: 84 81 95 93  Temp: 93.3 F (34.1 C) 98.4 F (36.9 C) 98.2 F (36.8 C) 97.6 F (36.4 C)  TempSrc: Oral Oral Oral Oral  Resp: 22 18 18 18   Height:      Weight:  204 lb 12.9 oz (92.9 kg)    SpO2: 92% 97% 97% 99%    General appearance: alert, cooperative, appears stated age and no distress Neck: no JVD Lungs: clear to auscultation bilaterally Heart: irregularly irregular rhythm and rapid with out murmur Extremities: extremities normal, atraumatic, no cyanosis or edema and venous stasis dermatitis noted Pulses: 2+ and symmetric Skin: Skin color, texture, turgor normal. No  rashes or lesions Neurologic: Alert and oriented X 3, normal strength and tone. Normal symmetric reflexes. Normal coordination and gait  TELEMETRY: Reviewed telemetry pt in afib with rapid raes  Average about 90 with peaks to 150:    Intake/Output Summary (Last 24 hours) at 09/06/11 1415 Last data filed at 09/06/11 1118  Gross per 24 hour  Intake    963 ml  Output   1475 ml  Net   -512 ml    LABS: Basic Metabolic Panel:  Lab 09/06/11 4540 09/05/11 0600 09/04/11 1200  NA 140 140 139  K 3.7 3.7 4.2  CL 101 101 103  CO2 27 33* 28  GLUCOSE 93 114* 162*  BUN 16 15 13   CREATININE 0.69 0.74 0.70  CALCIUM 8.9 8.9 --  MG -- -- --  PHOS -- -- --   Cardiac Enzymes:  Basename 09/05/11 0600 09/04/11 2340 09/04/11 1759  CKTOTAL 23 29 30   CKMB 2.5 2.5 2.6  CKMBINDEX -- -- --  TROPONINI <0.30 <0.30 <0.30   CBC:  Lab 09/06/11 0535 09/05/11 0600 09/04/11 1200  WBC 9.3 9.1 10.9*  NEUTROABS -- -- --  HGB 13.9 13.4 14.0  HCT 39.2 40.0 40.7  MCV 89.3 90.5 88.9  PLT 205 209 225   Liver Function Tests:  Basename 09/04/11 1200  AST 18  ALT 35  ALKPHOS 66  BILITOT 1.1  PROT 6.9  ALBUMIN 3.4*   No  results found for this basename: LIPASE:2,AMYLASE:2 in the last 72 hours BNP:  Basename 09/04/11 1200  POCBNP 1595.0*   D-Dimer:  Basename 09/04/11 1803  DDIMER <0.22   Hemoglobin A1C:  Basename 09/04/11 1803  HGBA1C 6.0*   Fasting Lipid Panel:  Basename 09/04/11 1940  CHOL 129  HDL 40  LDLCALC 68  TRIG 106  CHOLHDL 3.2  LDLDIRECT --   Thyroid Function Tests: No results found for this basename: TSH,T4TOTAL,FREET3,T3FREE,THYROIDAB in the last 72 hours Anemia Panel: No results found for this basename: VITAMINB12,FOLATE,FERRITIN,TIBC,IRON,RETICCTPCT in the last 72 hours          Signed, Sherryl Manges MD  09/06/2011

## 2011-09-06 NOTE — Progress Notes (Signed)
ANTICOAGULATION CONSULT NOTE - Follow Up Consult  Pharmacy Consult for Coumadin Indication: atrial fibrillation  Allergies  Allergen Reactions  . Avelox (Moxifloxacin Hydrochloride) Itching    Headache  . Tadalafil     REACTION: HA, backache    Patient Measurements: Height: 5\' 9"  (175.3 cm) Weight: 204 lb 12.9 oz (92.9 kg) IBW/kg (Calculated) : 70.7  Adjusted Body Weight:    Vital Signs: Temp: 98.2 F (36.8 C) (11/26 1110) Temp src: Oral (11/26 1110) BP: 131/80 mmHg (11/26 1110) Pulse Rate: 95  (11/26 1110)  Labs:  Basename 09/06/11 0535 09/05/11 0600 09/04/11 2340 09/04/11 1759 09/04/11 1200  HGB 13.9 13.4 -- -- --  HCT 39.2 40.0 -- -- 40.7  PLT 205 209 -- -- 225  APTT -- -- -- -- 46*  LABPROT 18.7* 22.4* -- -- 25.8*  INR 1.53* 1.93* -- -- 2.31*  HEPARINUNFRC -- -- -- -- --  CREATININE 0.69 0.74 -- -- 0.70  CKTOTAL -- 23 29 30  --  CKMB -- 2.5 2.5 2.6 --  TROPONINI -- <0.30 <0.30 <0.30 --   Estimated Creatinine Clearance: 96.7 ml/min (by C-G formula based on Cr of 0.69).  Medical History: Past Medical History  Diagnosis Date  . HTN (hypertension)     at some point hard to control- saw Dr Lowell Guitar  . HLD (hyperlipidemia)   . DM (diabetes mellitus) 1999    adult onset  . BPH (benign prostatic hyperplasia)     saw Dr [name] 2004, normal renal u/s  . Leg ulcer 2011  . Chronic diastolic heart failure     Echocardiogram 12/25/10: EF 45-50%; moderate LVH; global hypokinesis; moderate biatrial enlargement; PASP 36;    Cardiac catheterization July 2008: No CAD; moderate pulmonary hypertension  . Pleural effusion 2008    Status post decortication  . Insomnia   . Permanent atrial fibrillation     Dr. Ladona Ridgel  . Headache     Medications:  Prescriptions prior to admission  Medication Sig Dispense Refill  . albuterol (PROVENTIL HFA;VENTOLIN HFA) 108 (90 BASE) MCG/ACT inhaler Inhale 2 puffs into the lungs every 6 (six) hours as needed for wheezing.  1 Inhaler  0    . aspirin 325 MG tablet Take 325 mg by mouth daily.        Marland Kitchen dextromethorphan-guaiFENesin (MUCINEX DM) 30-600 MG per 12 hr tablet Take 1 tablet by mouth as needed. For congestion      . digoxin (LANOXIN) 0.25 MG tablet Take 1 tablet (250 mcg total) by mouth daily.  90 tablet  3  . diltiazem (CARDIZEM CD) 180 MG 24 hr capsule Take 1 capsule (180 mg total) by mouth daily.  90 capsule  3  . famotidine (PEPCID) 20 MG tablet Take 20 mg by mouth at bedtime.        . fluticasone (FLONASE) 50 MCG/ACT nasal spray Place 1 spray into the nose daily.        . furosemide (LASIX) 40 MG tablet Take 40 mg by mouth daily.       Marland Kitchen glyBURIDE (DIABETA) 5 MG tablet Take 10 mg by mouth 2 (two) times daily with a meal.        . losartan (COZAAR) 100 MG tablet Take 1 tablet (100 mg total) by mouth daily.  90 tablet  3  . metFORMIN (GLUCOPHAGE) 850 MG tablet Take 1 tablet (850 mg total) by mouth 2 (two) times daily with a meal.  180 tablet  3  . metoprolol (LOPRESSOR) 50 MG tablet  Take 25 mg by mouth 2 (two) times daily.       . Multiple Vitamin (MULTIVITAMIN) tablet Take 1 tablet by mouth daily.        . niacin-lovastatin (ADVICOR) 1000-20 MG 24 hr tablet Take 1 tablet by mouth at bedtime.        . pantoprazole (PROTONIX) 40 MG tablet Take 40 mg by mouth every morning.        . sitaGLIPtin (JANUVIA) 100 MG tablet Take 1 tablet (100 mg total) by mouth daily.  90 tablet  3  . warfarin (COUMADIN) 5 MG tablet Take 5 mg by mouth at bedtime.       Marland Kitchen zolpidem (AMBIEN) 10 MG tablet Take 10 mg by mouth at bedtime.       Marland Kitchen DISCONTD: fluticasone (FLONASE) 50 MCG/ACT nasal spray Place 2 sprays into the nose daily.  16 g  2  . DISCONTD: glyBURIDE (DIABETA) 5 MG tablet Take 4 tablets (20 mg total) by mouth daily.  360 tablet  3  . DISCONTD: niacin-lovastatin (ADVICOR) 1000-20 MG 24 hr tablet Take 1 tablet by mouth daily.  90 tablet  3  . clobetasol (TEMOVATE) 0.05 % cream Apply topically as directed.        Marland Kitchen  HYDROcodone-acetaminophen (NORCO) 5-325 MG per tablet Take 1 tablet by mouth every 4 (four) hours as needed. For pain        Assessment: 70 yo M with atrial fibrillation on Coumadin PTA.  INR cont to decrease after missed dose 111/25   Goal of Therapy:  INR 2-3   Plan:  1. Coumadin 6mg  po today.  2. F/u AM INR.  Beyonca Wisz Poteet 09/06/2011,1:23 PM

## 2011-09-06 NOTE — Progress Notes (Signed)
Utilization review completed.  

## 2011-09-06 NOTE — Progress Notes (Signed)
Subjective: S: Shortness of breath improving today, no wheezing. Heart rate not well controlled, at 128 during ambulation with PT, afib.  Objective: Weight change: -4.17 kg (-9 lb 3.1 oz)  Intake/Output Summary (Last 24 hours) at 09/06/11 1333 Last data filed at 09/06/11 1118  Gross per 24 hour  Intake    963 ml  Output   1475 ml  Net   -512 ml   Blood pressure 131/80, pulse 95, temperature 98.2 F (36.8 C), temperature source Oral, resp. rate 18, height 5\' 9"  (1.753 m), weight 92.9 kg (204 lb 12.9 oz), SpO2 97.00%.  Physical Exam: General: Alert and awake, oriented x3, not in any acute distress. HEENT: anicteric sclera, pupils reactive to light and accommodation, EOMI CVS: S1-S2 clear, no murmur rubs or gallops, irregular rhythm Chest: clear to auscultation bilaterally, no wheezing, rales or rhonchi Abdomen: soft nontender, nondistended, normal bowel sounds, no organomegaly Extremities: no cyanosis, clubbing or edema noted bilaterally Neuro: Cranial nerves II-XII intact, no focal neurological deficits  Lab Results: Basic Metabolic Panel:  Lab 09/06/11 4540 09/05/11 0600  NA 140 140  K 3.7 3.7  CL 101 101  CO2 27 33*  GLUCOSE 93 114*  BUN 16 15  CREATININE 0.69 0.74  CALCIUM 8.9 8.9  MG -- --  PHOS -- --   Liver Function Tests:  Lab 09/04/11 1200  AST 18  ALT 35  ALKPHOS 66  BILITOT 1.1  PROT 6.9  ALBUMIN 3.4*   CBC:  Lab 09/06/11 0535 09/05/11 0600  WBC 9.3 9.1  NEUTROABS -- --  HGB 13.9 13.4  HCT 39.2 40.0  MCV 89.3 90.5  PLT 205 209   Cardiac Enzymes:  Lab 09/05/11 0600 09/04/11 2340 09/04/11 1759  CKTOTAL 23 29 30   CKMB 2.5 2.5 2.6  CKMBINDEX -- -- --  TROPONINI <0.30 <0.30 <0.30   BNP:  Lab 09/04/11 1200  POCBNP 1595.0*   CBG:  Lab 09/06/11 1226 09/06/11 0610 09/05/11 2124 09/05/11 1628 09/05/11 1113  GLUCAP 138* 112* 101* 126* 168*     Studies/Results: Dg Chest 2 View  09/05/2011  *RADIOLOGY REPORT*  Clinical Data: 70 year old  male with shortness of breath and CHF exacerbation.  CHEST - 2 VIEW  Comparison: 09/04/2011 and prior chest radiographs  Findings: Cardiomegaly, pulmonary vascular congestion and mild interstitial pulmonary edema are again noted. A very small right pleural effusion is unchanged. There is no evidence of pneumothorax. Little interval change is noted since the prior study.  IMPRESSION: Stable chest radiograph with cardiomegaly, interstitial pulmonary edema and very small right pleural effusion.  Original Report Authenticated By: Rosendo Gros, M.D.   Dg Chest 2 View  08/24/2011  *RADIOLOGY REPORT*  Clinical Data: Cough.  Shortness of breath and congestion  CHEST - 2 VIEW  Comparison: 08/16/2011  Findings: The heart size appears moderately enlarged.  There is no pleural effusion identified. There has been interval resolution of previously noted interstitial edema.  Diffuse, coarsened interstitial markings are noted bilaterally consistent with chronic interstitial lung disease.  No focal areas of airspace consolidation noted.  IMPRESSION:  1.  Chronic interstitial lung disease, similar to previous examinations. 2.  Resolution of edema.  Original Report Authenticated By: Rosealee Albee, M.D.   Dg Chest 2 View  08/16/2011  *RADIOLOGY REPORT*  Clinical Data: 70 year old male with cough, shortness of breath, chest pain.  CHEST - 2 VIEW  Comparison: 07/31/2011 and earlier.  Findings: Interval progression of basilar predominant reticular nodular bilateral pulmonary opacity.  There remains  trace fluid in the fissures. Stable cardiomegaly and mediastinal contours.  No consolidation.  Left perihilar patchy opacity has diminished. Stable visualized osseous structures.  IMPRESSION: Interval worsening ventilation at the lung bases with reticulonodular density which could be either infection or interstitial edema. Meanwhile left upper lobe perihilar patchy opacity has regressed.  Original Report Authenticated By: Harley Hallmark, M.D.   Dg Chest Portable 1 View  09/04/2011  *RADIOLOGY REPORT*  Clinical Data: Chest pain, shortness of breath  PORTABLE CHEST - 1 VIEW  Comparison: 08/24/2011  Findings: Interval increase in mild diffuse interstitial edema. Patchy airspace opacities at the lung bases, left greater than right, slightly increased.  Suspect small right pleural effusion. Mild cardiomegaly stable.  Atheromatous aortic arch.  IMPRESSION: 1.  Interval increase in bilateral edema or infiltrates. 2.  Suspect small right pleural effusion.  Original Report Authenticated By: Osa Craver, M.D.    Medications: Scheduled Meds:    . aspirin  325 mg Oral Daily  . dextromethorphan-guaiFENesin  1 tablet Oral BID  . digoxin  250 mcg Oral Daily  . diltiazem  180 mg Oral Daily  . doxycycline  100 mg Oral Q12H  . famotidine  20 mg Oral QHS  . fluticasone  1 spray Each Nare Daily  . furosemide  40 mg Intravenous Q12H  . glyBURIDE  10 mg Oral BID WC  . insulin aspart  0-5 Units Subcutaneous QHS  . insulin aspart  0-9 Units Subcutaneous TID WC  . ipratropium  0.5 mg Nebulization Q6H  . levalbuterol  0.63 mg Nebulization Q6H  . linagliptin  5 mg Oral Daily  . losartan  100 mg Oral Daily  . metoprolol  75 mg Oral BID  . multivitamin  1 tablet Oral Daily  . niacin  1,000 mg Oral QHS  . pantoprazole  40 mg Oral Q1200  . simvastatin  10 mg Oral QHS  . sodium chloride  3 mL Intravenous Q12H  . warfarin  6 mg Oral ONCE-1800  . warfarin  6 mg Oral ONCE-1800  . zolpidem  10 mg Oral QHS  . DISCONTD: metoprolol  50 mg Oral BID   Continuous Infusions:    . sodium chloride Stopped (09/04/11 1533)   PRN Meds:.acetaminophen, HYDROcodone-acetaminophen, nitroGLYCERIN, ondansetron (ZOFRAN) IV, sodium chloride, sodium chloride  Assessment/Plan:  Principal Problem:  *Diastolic CHF, acute on chronic - Continue on heart failure pathway, on aspirin, beta blocker, ACEI and IV diuresis with Lasix. Continue daily  weights, strict I.'s and O's. - Increased beta blocker (Lopressor) to 75 mg twice a day. I discussed with patient's wife over the phone, his home dose of Lopressor is 75 mg twice a day which was recently increased. -  2-D echocardiogram done but results pending pending. -  If patient has significant drop in the EF compared to the previous echo in March 2012, he may need a repeat cardiac catheterization.    Active Problems: .Permanent atrial fibrillation with RVR:  - Continue Cardizem, digoxin for rate control, Coumadin per pharmacy.  -  Lopressor increased to 75 mg twice a day.  Hypertension: Continue losartan, Lasix, Cardizem, Lopressor  Hyperlipidemia: LDL 68 at goal. 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  - Patient's pulmonologist, [name] will be consulted if pulmonary status worsens.   Diabetes mellitus: HbA1c 6.0, blood sugar is well controlled - on sliding scale insulin, continue glyburide and tradjenta   DVT prophylaxis: On Coumadin  Disposition: Continue diuresis, await 2-D echo results, hopefully DC in 24-48 hours if symptoms improving and okay with cardiology.   LOS: 2 days   RAI,RIPUDEEP 09/06/2011, 1:33 PM

## 2011-09-06 NOTE — Progress Notes (Signed)
CARDIAC REHAB PHASE I   PRE:  Rate/Rhythm: 96-112 afib  BP:  Supine:   Sitting: 145/79  Standing:    SaO2: 99%2L  MODE:  Ambulation: 420 ft   POST:  Rate/Rhythem: 128afib  BP:  Supine:   Sitting: 149/56  Standing:    SaO2: 94%RA  Pt walked 420 ft on RA with steady gait. Tolerated well on RA. REviewed CHF signs, watching sodium, and gave ex ed. Hr to 128 during walk.  [id number]  Bradley [name]

## 2011-09-07 ENCOUNTER — Encounter: Payer: Medicare Other | Admitting: *Deleted

## 2011-09-07 LAB — GLUCOSE, CAPILLARY
Glucose-Capillary: 119 mg/dL — ABNORMAL HIGH (ref 70–99)
Glucose-Capillary: 175 mg/dL — ABNORMAL HIGH (ref 70–99)
Glucose-Capillary: 143 mg/dL — ABNORMAL HIGH (ref 70–99)
Glucose-Capillary: 112 mg/dL — ABNORMAL HIGH (ref 70–99)

## 2011-09-07 LAB — BASIC METABOLIC PANEL
BUN: 21 mg/dL (ref 6–23)
CO2: 32 mEq/L (ref 19–32)
Calcium: 8.8 mg/dL (ref 8.4–10.5)
Chloride: 101 mEq/L (ref 96–112)
Creatinine, Ser: 0.83 mg/dL (ref 0.50–1.35)
GFR calc Af Amer: >90 mL/min (ref >90)
GFR calc non Af Amer: 87 mL/min — ABNORMAL LOW (ref >90)
Glucose, Bld: 103 mg/dL — ABNORMAL HIGH (ref 70–99)
Potassium: 3.9 mEq/L (ref 3.5–5.1)
Sodium: 140 mEq/L (ref 135–145)

## 2011-09-07 LAB — CBC
HCT: 41.1 % (ref 39.0–52.0)
Hemoglobin: 14.2 g/dL (ref 13.0–17.0)
MCH: 31.3 pg (ref 26.0–34.0)
MCHC: 34.5 g/dL (ref 30.0–36.0)
MCV: 90.5 fL (ref 78.0–100.0)
Platelets: 229 10*3/uL (ref 150–400)
RBC: 4.54 MIL/uL (ref 4.22–5.81)
RDW: 14.3 % (ref 11.5–15.5)
WBC: 8.7 10*3/uL (ref 4.0–10.5)

## 2011-09-07 LAB — DIGOXIN LEVEL: Digoxin Level: 0.5 ng/mL — ABNORMAL LOW (ref 0.8–2.0)

## 2011-09-07 LAB — PROTIME-INR
INR: 1.77 — ABNORMAL HIGH (ref 0.00–1.49)
Prothrombin Time: 20.9 seconds — ABNORMAL HIGH (ref 11.6–15.2)

## 2011-09-07 MED ORDER — WARFARIN SODIUM 6 MG PO TABS
6.0000 mg | ORAL_TABLET | Freq: Once | ORAL | Status: AC
  Administered 2011-09-07: 6 mg via ORAL
  Filled 2011-09-07: qty 1

## 2011-09-07 NOTE — Progress Notes (Signed)
Subjective: S: Shortness of breath improving today, no wheezing.   Objective: Weight change: 0 kg (0 lb)  Intake/Output Summary (Last 24 hours) at 09/07/11 1241 Last data filed at 09/07/11 1014  Gross per 24 hour  Intake    483 ml  Output   1425 ml  Net   -942 ml   Blood pressure 149/84, pulse 97, temperature 97.3 F (36.3 C), temperature source Oral, resp. rate 22, height 5\' 9"  (1.753 m), weight 92.9 kg (204 lb 12.9 oz), SpO2 97.00%.  Physical Exam: General: Alert and awake, oriented x3, not in any acute distress. HEENT: anicteric sclera, pupils reactive to light and accommodation, EOMI CVS: S1-S2 clear, no murmur rubs or gallops, irregular rhythm Chest: clear to auscultation bilaterally, no wheezing, rales or rhonchi Abdomen: soft nontender, nondistended, normal bowel sounds, no organomegaly Extremities: no cyanosis, clubbing or edema noted bilaterally Neuro: Cranial nerves II-XII intact, no focal neurological deficits  Lab Results: Basic Metabolic Panel:  Lab 09/07/11 9604 09/06/11 0535  NA 140 140  K 3.9 3.7  CL 101 101  CO2 32 27  GLUCOSE 103* 93  BUN 21 16  CREATININE 0.83 0.69  CALCIUM 8.8 8.9  MG -- --  PHOS -- --   Liver Function Tests:  Lab 09/04/11 1200  AST 18  ALT 35  ALKPHOS 66  BILITOT 1.1  PROT 6.9  ALBUMIN 3.4*   CBC:  Lab 09/07/11 0524 09/06/11 0535  WBC 8.7 9.3  NEUTROABS -- --  HGB 14.2 13.9  HCT 41.1 39.2  MCV 90.5 89.3  PLT 229 205   Cardiac Enzymes:  Lab 09/05/11 0600 09/04/11 2340 09/04/11 1759  CKTOTAL 23 29 30   CKMB 2.5 2.5 2.6  CKMBINDEX -- -- --  TROPONINI <0.30 <0.30 <0.30   BNP:  Lab 09/04/11 1200  POCBNP 1595.0*   CBG:  Lab 09/07/11 1117 09/07/11 0624 09/06/11 2133 09/06/11 1617 09/06/11 1226  GLUCAP 175* 119* 123* 168* 138*     Studies/Results: Dg Chest 2 View  09/05/2011  *RADIOLOGY REPORT*  Clinical Data: 70 year old male with shortness of breath and CHF exacerbation.  CHEST - 2 VIEW  Comparison:  09/04/2011 and prior chest radiographs  Findings: Cardiomegaly, pulmonary vascular congestion and mild interstitial pulmonary edema are again noted. A very small right pleural effusion is unchanged. There is no evidence of pneumothorax. Little interval change is noted since the prior study.  IMPRESSION: Stable chest radiograph with cardiomegaly, interstitial pulmonary edema and very small right pleural effusion.  Original Report Authenticated By: Rosendo Gros, M.D.   Dg Chest 2 View  08/24/2011  *RADIOLOGY REPORT*  Clinical Data: Cough.  Shortness of breath and congestion  CHEST - 2 VIEW  Comparison: 08/16/2011  Findings: The heart size appears moderately enlarged.  There is no pleural effusion identified. There has been interval resolution of previously noted interstitial edema.  Diffuse, coarsened interstitial markings are noted bilaterally consistent with chronic interstitial lung disease.  No focal areas of airspace consolidation noted.  IMPRESSION:  1.  Chronic interstitial lung disease, similar to previous examinations. 2.  Resolution of edema.  Original Report Authenticated By: Rosealee Albee, M.D.   Dg Chest 2 View  08/16/2011  *RADIOLOGY REPORT*  Clinical Data: 70 year old male with cough, shortness of breath, chest pain.  CHEST - 2 VIEW  Comparison: 07/31/2011 and earlier.  Findings: Interval progression of basilar predominant reticular nodular bilateral pulmonary opacity.  There remains trace fluid in the fissures. Stable cardiomegaly and mediastinal contours.  No consolidation.  Left perihilar patchy opacity has diminished. Stable visualized osseous structures.  IMPRESSION: Interval worsening ventilation at the lung bases with reticulonodular density which could be either infection or interstitial edema. Meanwhile left upper lobe perihilar patchy opacity has regressed.  Original Report Authenticated By: Harley Hallmark, M.D.   Dg Chest Portable 1 View  09/04/2011  *RADIOLOGY REPORT*   Clinical Data: Chest pain, shortness of breath  PORTABLE CHEST - 1 VIEW  Comparison: 08/24/2011  Findings: Interval increase in mild diffuse interstitial edema. Patchy airspace opacities at the lung bases, left greater than right, slightly increased.  Suspect small right pleural effusion. Mild cardiomegaly stable.  Atheromatous aortic arch.  IMPRESSION: 1.  Interval increase in bilateral edema or infiltrates. 2.  Suspect small right pleural effusion.  Original Report Authenticated By: Osa Craver, M.D.    Medications: Scheduled Meds:    . aspirin  325 mg Oral Daily  . dextromethorphan-guaiFENesin  1 tablet Oral BID  . digoxin  250 mcg Oral Daily  . diltiazem  180 mg Oral Daily  . doxycycline  100 mg Oral Q12H  . famotidine  20 mg Oral QHS  . fluticasone  1 spray Each Nare Daily  . furosemide  40 mg Intravenous Q12H  . glyBURIDE  10 mg Oral BID WC  . insulin aspart  0-5 Units Subcutaneous QHS  . insulin aspart  0-9 Units Subcutaneous TID WC  . ipratropium  0.5 mg Nebulization Q6H  . levalbuterol  0.63 mg Nebulization Q6H  . linagliptin  5 mg Oral Daily  . losartan  100 mg Oral Daily  . metoprolol  100 mg Oral BID  . multivitamin  1 tablet Oral Daily  . niacin  1,000 mg Oral QHS  . pantoprazole  40 mg Oral Q1200  . simvastatin  10 mg Oral QHS  . sodium chloride  3 mL Intravenous Q12H  . warfarin  6 mg Oral ONCE-1800  . warfarin  6 mg Oral ONCE-1800  . zolpidem  10 mg Oral QHS  . DISCONTD: metoprolol  75 mg Oral BID   Continuous Infusions:    . sodium chloride Stopped (09/04/11 1533)   PRN Meds:.acetaminophen, HYDROcodone-acetaminophen, nitroGLYCERIN, ondansetron (ZOFRAN) IV, sodium chloride, sodium chloride  Study Conclusions 2-D echo 09/05/2011   - Left ventricle: The cavity size was normal. There was moderate concentric hypertrophy. Systolic function was moderately reduced. The estimated ejection fraction was in the range of 35% to 40%. Moderate diffuse  hypokinesis. Possible hypokinesis of the basal-midanteroseptal myocardium. - Mitral valve: Mild regurgitation. - Left atrium: The atrium was moderately to severely dilated. - Pulmonary arteries: Systolic pressure was mildly increased. PA peak pressure: 39mm Hg (S).    Assessment/Plan:  Principal Problem:  *Diastolic CHF, acute on chronic - Continue on heart failure pathway, on aspirin, Lopressor, ACEI and IV Lasix. Cardiology to transition to PO lasix. -  2-D echo shows drop in EF 35-40%, Possible hypokinesis of the basal-midanteroseptal myocardium   Active Problems: .Permanent atrial fibrillation with RVR:  - Continue Cardizem, Lopressor, digoxin for rate control, Coumadin per pharmacy.  - Per cardiology recommendations, will need ablation and BiVpacer  Hypertension: Stable, continue losartan, Lasix, Cardizem, Lopressor  Hyperlipidemia: LDL 68 at goal. - continue statins   Bronchitis/COPD: No wheezing noted, improved -Continue Xopenex and Atrovent nebs, O2, fluticasone, doxycycline for 7-10days.   Diabetes mellitus: HbA1c 6.0, blood sugars are well controlled - on sliding scale insulin, continue glyburide and tradjenta   DVT prophylaxis: On Coumadin  Disposition: Patient  will be transferred to cardiology service under attending Dr. Sharrell Ku, discussed with [facility] cardiology, Ward Givens today. I will sign off at this time.   LOS: 3 days   RAI,RIPUDEEP 09/07/2011, 12:41 PM

## 2011-09-07 NOTE — Progress Notes (Signed)
CARDIAC REHAB PHASE I   PRE:  Rate/Rhythm: 81 Afib  BP:  Supine:   Sitting: 129/79  Standing:    SaO2: 97% 2L  MODE:  Ambulation: 460 ft   POST:  Rate/Rhythem: 93  BP:  Supine:   Sitting: 141/96  Standing:    SaO2: 92% RA  1155 - 1220 Pt ambulated with 1 assist on RA tolerated well. States will be having a pace maker placed.   Rosalie Doctor

## 2011-09-07 NOTE — Progress Notes (Signed)
Patient ID: Bradley [name], male   DOB: 1940/12/04, 70 y.o.   MRN: [id number] Subjective:  Dyspnea is improved as are palpitations.  Objective:  Vital Signs in the last 24 hours: Temp:  [97.3 F (36.3 C)-98.2 F (36.8 C)] 97.3 F (36.3 C) (11/27 0502) Pulse Rate:  [87-112] 87  (11/27 0502) Resp:  [18-22] 22  (11/27 0502) BP: (118-131)/(69-86) 126/86 mmHg (11/27 0502) SpO2:  [94 %-100 %] 97 % (11/27 0938) Weight:  [92.9 kg (204 lb 12.9 oz)] 204 lb 12.9 oz (92.9 kg) (11/27 0502)  Intake/Output from previous day: 11/26 0701 - 11/27 0700 In: 483 [P.O.:480; I.V.:3] Out: 1425 [Urine:1425] Intake/Output from this shift:    Physical Exam: Well appearing NAD HEENT: Unremarkable Neck:  No JVD, no thyromegally Lymphatics:  No adenopathy Back:  No CVA tenderness Lungs:  Clear except for basilar rales. HEART:  Iregular rate rhythm, no murmurs, no rubs, no clicks Abd:  Flat, positive bowel sounds, no organomegally, no rebound, no guarding Ext:  2 plus pulses, no edema, no cyanosis, no clubbing Skin:  No rashes no nodules Neuro:  CN II through XII intact, motor grossly intact  Lab Results:  Basename 09/07/11 0524 09/06/11 0535  WBC 8.7 9.3  HGB 14.2 13.9  PLT 229 205    Basename 09/07/11 0524 09/06/11 0535  NA 140 140  K 3.9 3.7  CL 101 101  CO2 32 27  GLUCOSE 103* 93  BUN 21 16  CREATININE 0.83 0.69    Basename 09/05/11 0600 09/04/11 2340  TROPONINI <0.30 <0.30   Hepatic Function Panel  Basename 09/04/11 1200  PROT 6.9  ALBUMIN 3.4*  AST 18  ALT 35  ALKPHOS 66  BILITOT 1.1  BILIDIR --  IBILI --    Basename 09/04/11 1940  CHOL 129   No results found for this basename: PROTIME in the last 72 hours  Cardiac Studies: Tele - atrial fib with an RVR persists. 2D echo - EF 35% (was 45%) with global hypokinesis. Assessment/Plan:  Principal Problem:  *Systolic and diastolic CHF, acute - His symptoms are improved. He has had 3 hospitalization for atrial fib with RVR  in setting of decompensated CHF. His EF is down from normal to 45% to 35% over the past months. He has no obstructive CAD by cath. I have recommended proceeding with AV node ablation and BiV PPM.   nonischemic cardiomyopathy - his EF continues downward. I have recommended above treatment. In addition, he will continue his current medical therapy.    LOS: 3 days    Lewayne Bunting 09/07/2011, 9:53 AM

## 2011-09-07 NOTE — Progress Notes (Signed)
ANTICOAGULATION CONSULT NOTE - Follow Up Consult  Pharmacy Consult for Coumadin Indication: atrial fibrillation  Allergies  Allergen Reactions  . Avelox (Moxifloxacin Hydrochloride) Itching    Headache  . Tadalafil     REACTION: HA, backache    Patient Measurements: Height: 5\' 9"  (175.3 cm) Weight: 204 lb 12.9 oz (92.9 kg) IBW/kg (Calculated) : 70.7   Vital Signs: Temp: 97.3 F (36.3 C) (11/27 0502) BP: 126/86 mmHg (11/27 0502) Pulse Rate: 87  (11/27 0502)  Labs:  Basename 09/07/11 0524 09/06/11 0535 09/05/11 0600 09/04/11 2340 09/04/11 1759 09/04/11 1200  HGB 14.2 13.9 -- -- -- --  HCT 41.1 39.2 40.0 -- -- --  PLT 229 205 209 -- -- --  APTT -- -- -- -- -- 46*  LABPROT 20.9* 18.7* 22.4* -- -- --  INR 1.77* 1.53* 1.93* -- -- --  HEPARINUNFRC -- -- -- -- -- --  CREATININE 0.83 0.69 0.74 -- -- --  CKTOTAL -- -- 23 29 30  --  CKMB -- -- 2.5 2.5 2.6 --  TROPONINI -- -- <0.30 <0.30 <0.30 --   Estimated Creatinine Clearance: 93.2 ml/min (by C-G formula based on Cr of 0.83).   Medications:  Scheduled:    . aspirin  325 mg Oral Daily  . dextromethorphan-guaiFENesin  1 tablet Oral BID  . digoxin  250 mcg Oral Daily  . diltiazem  180 mg Oral Daily  . doxycycline  100 mg Oral Q12H  . famotidine  20 mg Oral QHS  . fluticasone  1 spray Each Nare Daily  . furosemide  40 mg Intravenous Q12H  . glyBURIDE  10 mg Oral BID WC  . insulin aspart  0-5 Units Subcutaneous QHS  . insulin aspart  0-9 Units Subcutaneous TID WC  . ipratropium  0.5 mg Nebulization Q6H  . levalbuterol  0.63 mg Nebulization Q6H  . linagliptin  5 mg Oral Daily  . losartan  100 mg Oral Daily  . metoprolol  100 mg Oral BID  . multivitamin  1 tablet Oral Daily  . niacin  1,000 mg Oral QHS  . pantoprazole  40 mg Oral Q1200  . simvastatin  10 mg Oral QHS  . sodium chloride  3 mL Intravenous Q12H  . warfarin  6 mg Oral ONCE-1800  . zolpidem  10 mg Oral QHS  . DISCONTD: metoprolol  50 mg Oral BID  .  DISCONTD: metoprolol  75 mg Oral BID    Assessment: 70 yom continues on coumadin for afib. INR of 1.77 is below goal, but back on the upward trend.   Goal of Therapy:  INR 2-3   Plan:  1) Repeat coumadin 6mg  x 1  Fredrik Rigger 09/07/2011,9:27 AM

## 2011-09-08 ENCOUNTER — Encounter (HOSPITAL_COMMUNITY)
Admission: EM | Disposition: A | Discharge status: Home / Self Care | Payer: Self-pay | Source: Home / Self Care | Attending: Internal Medicine

## 2011-09-08 DIAGNOSIS — I4891 Unspecified atrial fibrillation: Secondary | ICD-10-CM

## 2011-09-08 HISTORY — PX: BI-VENTRICULAR PACEMAKER INSERTION: SHX5462

## 2011-09-08 HISTORY — PX: INSERT / REPLACE / REMOVE PACEMAKER: SUR710

## 2011-09-08 LAB — PROTIME-INR
INR: 2 — ABNORMAL HIGH (ref 0.00–1.49)
Prothrombin Time: 23 seconds — ABNORMAL HIGH (ref 11.6–15.2)

## 2011-09-08 LAB — GLUCOSE, CAPILLARY
Glucose-Capillary: 121 mg/dL — ABNORMAL HIGH (ref 70–99)
Glucose-Capillary: 182 mg/dL — ABNORMAL HIGH (ref 70–99)
Glucose-Capillary: 74 mg/dL (ref 70–99)
Glucose-Capillary: 182 mg/dL — ABNORMAL HIGH (ref 70–99)

## 2011-09-08 SURGERY — BI-VENTRICULAR PACEMAKER INSERTION (CRT-P)
Anesthesia: Moderate Sedation | Laterality: Left

## 2011-09-08 MED ORDER — FENTANYL CITRATE 0.05 MG/ML IJ SOLN
INTRAMUSCULAR | Status: AC
  Filled 2011-09-08: qty 2

## 2011-09-08 MED ORDER — ACETAMINOPHEN 325 MG PO TABS: 325.0000 mg | ORAL_TABLET | ORAL | Status: DC | PRN

## 2011-09-08 MED ORDER — ONDANSETRON HCL 4 MG/2ML IJ SOLN: 4.0000 mg | Freq: Four times a day (QID) | INTRAMUSCULAR | Status: DC | PRN

## 2011-09-08 MED ORDER — LIDOCAINE HCL (PF) 1 % IJ SOLN
INTRAMUSCULAR | Status: AC
  Filled 2011-09-08: qty 60

## 2011-09-08 MED ORDER — SODIUM CHLORIDE 0.9 % IR SOLN
80.0000 mg | Status: DC
  Filled 2011-09-08: qty 2

## 2011-09-08 MED ORDER — FENTANYL CITRATE 0.05 MG/ML IJ SOLN: 25.0000 ug | INTRAMUSCULAR | Status: DC | PRN

## 2011-09-08 MED ORDER — SODIUM CHLORIDE 0.9 % IV SOLN: INTRAVENOUS | Status: DC

## 2011-09-08 MED ORDER — SODIUM CHLORIDE 0.45 % IV SOLN: INTRAVENOUS | Status: DC

## 2011-09-08 MED ORDER — MIDAZOLAM HCL 5 MG/5ML IJ SOLN
INTRAMUSCULAR | Status: AC
  Filled 2011-09-08: qty 5

## 2011-09-08 MED ORDER — LIDOCAINE HCL (PF) 1 % IJ SOLN
INTRAMUSCULAR | Status: AC
  Filled 2011-09-08: qty 30

## 2011-09-08 MED ORDER — POTASSIUM CHLORIDE IN NACL 20-0.45 MEQ/L-% IV SOLN
INTRAVENOUS | Status: AC
  Filled 2011-09-08: qty 1000

## 2011-09-08 MED ORDER — CEFAZOLIN SODIUM-DEXTROSE 2-3 GM-% IV SOLR
2.0000 g | INTRAVENOUS | Status: DC
  Filled 2011-09-08: qty 50

## 2011-09-08 MED ORDER — ACETAMINOPHEN 500 MG PO TABS
1000.0000 mg | ORAL_TABLET | Freq: Four times a day (QID) | ORAL | Status: DC
  Administered 2011-09-08 – 2011-09-09 (×3): 1000 mg via ORAL
  Filled 2011-09-08 (×4): qty 2

## 2011-09-08 MED ORDER — CEFAZOLIN SODIUM 1-5 GM-% IV SOLN
INTRAVENOUS | Status: AC
  Filled 2011-09-08: qty 100

## 2011-09-08 NOTE — Interval H&P Note (Signed)
History and Physical Interval Note:  09/08/2011 4:02 PM  Bradley [name]  has presented today for surgery, with the diagnosis of atrial fibrillation  The various methods of treatment have been discussed with the patient and family. After consideration of risks, benefits and other options for treatment, the patient has consented to  Procedure(s): BI-VENTRICULAR PACEMAKER INSERTION (CRT-P) as a surgical intervention .  The patients' history has been reviewed, patient examined, no change in status, stable for surgery.  I have reviewed the patients' chart and labs.  Questions were answered to the patient's satisfaction.     Lewayne Bunting

## 2011-09-08 NOTE — Progress Notes (Signed)
Patient Name: Bradley [name] Date of Encounter: 09-08-2011    SUBJECTIVE:Feels good.  No chest pain or shortness of breath.  "Ready to get procedure done so I can feel better"  TELEMETRY: Reviewed telemetry pt in atrial fibrillation with RVR, rates 80-130's: Filed Vitals:   09/07/11 2105 09/07/11 2115 09/08/11 0222 09/08/11 0513  BP: 137/77   138/74  Pulse: 77   82  Temp:    97.4 F (36.3 C)  TempSrc:      Resp:    20  Height:      Weight:    206 lb 2.1 oz (93.5 kg)  SpO2:  99% 96% 97%    Intake/Output Summary (Last 24 hours) at 09/08/11 0837 Last data filed at 09/08/11 0751  Gross per 24 hour  Intake    246 ml  Output   1900 ml  Net  -1654 ml    LABS: Basic Metabolic Panel:  Basename 09/07/11 0524 09/06/11 0535  NA 140 140  K 3.9 3.7  CL 101 101  CO2 32 27  GLUCOSE 103* 93  BUN 21 16  CREATININE 0.83 0.69  CALCIUM 8.8 8.9  MG -- --  PHOS -- --   CBC:  Basename 09/07/11 0524 09/06/11 0535  WBC 8.7 9.3  NEUTROABS -- --  HGB 14.2 13.9  HCT 41.1 39.2  MCV 90.5 89.3  PLT 229 205   Thyroid Function Tests:  Basename 09/06/11 1210  TSH 1.099  T4TOTAL --  T3FREE --  THYROIDAB --    Principal Problem:  *Systolic and diastolic CHF, acute Active Problems:  HYPERLIPIDEMIA  HYPERTENSION  Permanent atrial fibrillation  Long term current use of anticoagulant  Chest pain  Diabetes mellitus  Weight loss  nonischemic cardiomyopathy   Per Dr Jinelle Butchko, tentative plan for CRTP and AV node ablation this afternoon.  Pt aware and agrees with plan.  Dr Joslin Doell to review risks, benefits with patient later this morning.  He has eaten breakfast and received oral hypoglycemic agents.  INR today 2.00  Final plan per Dr Jathan Balling.  Signed, Amber Seiler RN, BSN 09-08-11 8:40 AM   Pt seen and examined. His ventricular rate remains elevated. I have discussed the risks/benefits/goals/expectations of BiV PPM and AV node ablation with the patient and he wishes to proceed. 

## 2011-09-08 NOTE — H&P (View-Only) (Signed)
Patient Name: Bradley [name] Date of Encounter: 09-08-2011    SUBJECTIVE:Feels good.  No chest pain or shortness of breath.  "Ready to get procedure done so I can feel better"  TELEMETRY: Reviewed telemetry pt in atrial fibrillation with RVR, rates 80-130's: Filed Vitals:   09/07/11 2105 09/07/11 2115 09/08/11 0222 09/08/11 0513  BP: 137/77   138/74  Pulse: 77   82  Temp:    97.4 F (36.3 C)  TempSrc:      Resp:    20  Height:      Weight:    206 lb 2.1 oz (93.5 kg)  SpO2:  99% 96% 97%    Intake/Output Summary (Last 24 hours) at 09/08/11 0837 Last data filed at 09/08/11 0751  Gross per 24 hour  Intake    246 ml  Output   1900 ml  Net  -1654 ml    LABS: Basic Metabolic Panel:  Basename 09/07/11 0524 09/06/11 0535  NA 140 140  K 3.9 3.7  CL 101 101  CO2 32 27  GLUCOSE 103* 93  BUN 21 16  CREATININE 0.83 0.69  CALCIUM 8.8 8.9  MG -- --  PHOS -- --   CBC:  Basename 09/07/11 0524 09/06/11 0535  WBC 8.7 9.3  NEUTROABS -- --  HGB 14.2 13.9  HCT 41.1 39.2  MCV 90.5 89.3  PLT 229 205   Thyroid Function Tests:  Basename 09/06/11 1210  TSH 1.099  T4TOTAL --  T3FREE --  THYROIDAB --    Principal Problem:  *Systolic and diastolic CHF, acute Active Problems:  HYPERLIPIDEMIA  HYPERTENSION  Permanent atrial fibrillation  Long term current use of anticoagulant  Chest pain  Diabetes mellitus  Weight loss  nonischemic cardiomyopathy   Per Dr Ladona Ridgel, tentative plan for CRTP and AV node ablation this afternoon.  Pt aware and agrees with plan.  Dr Ladona Ridgel to review risks, benefits with patient later this morning.  He has eaten breakfast and received oral hypoglycemic agents.  INR today 2.00  Final plan per Dr Ladona Ridgel.  Signed, Gypsy Balsam RN, BSN 09-08-11 8:40 AM   Pt seen and examined. His ventricular rate remains elevated. I have discussed the risks/benefits/goals/expectations of BiV PPM and AV node ablation with the patient and he wishes to proceed.

## 2011-09-08 NOTE — Op Note (Signed)
BiV PPM and AV node ablation carried out without immediate procedural complication. [id number]

## 2011-09-08 NOTE — Progress Notes (Signed)
CSW stopped by patient's room to complete a psychosocial assessment and provide CHF education but patient was in the Cath Lab. CSW will return to complete assessments and education.

## 2011-09-09 ENCOUNTER — Inpatient Hospital Stay (HOSPITAL_COMMUNITY): Payer: Medicare Other

## 2011-09-09 LAB — GLUCOSE, CAPILLARY
Glucose-Capillary: 142 mg/dL — ABNORMAL HIGH (ref 70–99)
Glucose-Capillary: 139 mg/dL — ABNORMAL HIGH (ref 70–99)
Glucose-Capillary: 106 mg/dL — ABNORMAL HIGH (ref 70–99)

## 2011-09-09 LAB — PROTIME-INR
INR: 1.61 — ABNORMAL HIGH (ref 0.00–1.49)
Prothrombin Time: 19.4 seconds — ABNORMAL HIGH (ref 11.6–15.2)

## 2011-09-09 MED ORDER — METOPROLOL TARTRATE 100 MG PO TABS: 100.0000 mg | ORAL_TABLET | Freq: Two times a day (BID) | ORAL | Status: DC

## 2011-09-09 MED ORDER — IPRATROPIUM BROMIDE 0.02 % IN SOLN: 0.5000 mg | Freq: Four times a day (QID) | RESPIRATORY_TRACT | Status: DC | PRN

## 2011-09-09 MED ORDER — ASPIRIN 81 MG PO TABS: 81.0000 mg | ORAL_TABLET | Freq: Every day | ORAL | Status: DC

## 2011-09-09 MED ORDER — LEVALBUTEROL HCL 0.63 MG/3ML IN NEBU
0.6300 mg | INHALATION_SOLUTION | Freq: Four times a day (QID) | RESPIRATORY_TRACT | Status: DC | PRN
  Filled 2011-09-09: qty 3

## 2011-09-09 MED ORDER — WARFARIN SODIUM 7.5 MG PO TABS
7.5000 mg | ORAL_TABLET | Freq: Once | ORAL | Status: DC
  Filled 2011-09-09: qty 1

## 2011-09-09 MED ORDER — FUROSEMIDE 40 MG PO TABS: 60.0000 mg | ORAL_TABLET | Freq: Every day | ORAL | Status: DC

## 2011-09-09 MED ORDER — FAMOTIDINE 20 MG PO TABS: 20.0000 mg | ORAL_TABLET | Freq: Every day | ORAL | Status: DC

## 2011-09-09 NOTE — Progress Notes (Signed)
Patient ID: Bradley [name], male   DOB: November 07, 1940, 70 y.o.   MRN: [id number] Subjective:  No chest pain or sob. No palpitations. Objective:  Vital Signs in the last 24 hours: Temp:  [97.7 F (36.5 C)-98.4 F (36.9 C)] 97.7 F (36.5 C) (11/29 0500) Pulse Rate:  [70-90] 88  (11/29 0500) Resp:  [20] 20  (11/29 0500) BP: (139-161)/(79-98) 161/98 mmHg (11/29 0500) SpO2:  [91 %-99 %] 91 % (11/29 0500) Weight:  [92.2 kg (203 lb 4.2 oz)] 203 lb 4.2 oz (92.2 kg) (11/29 0500)  Intake/Output from previous day: 11/28 0701 - 11/29 0700 In: 480 [P.O.:480] Out: 2325 [Urine:2325] Intake/Output from this shift: Total I/O In: 240 [P.O.:240] Out: -   Physical Exam: Well appearing NAD HEENT: Unremarkable Neck:  No JVD, no thyromegally Lymphatics:  No adenopathy Back:  No CVA tenderness Lungs:  Clear with well healed PPM incision. No hematoma. HEART:  Regular rate rhythm, no murmurs, no rubs, no clicks Abd:  Flat, positive bowel sounds, no organomegally, no rebound, no guarding Ext:  2 plus pulses, no edema, no cyanosis, no clubbing Skin:  No rashes no nodules Neuro:  CN II through XII intact, motor grossly intact  Lab Results:  Basename 09/07/11 0524  WBC 8.7  HGB 14.2  PLT 229    Basename 09/07/11 0524  NA 140  K 3.9  CL 101  CO2 32  GLUCOSE 103*  BUN 21  CREATININE 0.83   No results found for this basename: TROPONINI:2,CK,MB:2 in the last 72 hours Hepatic Function Panel No results found for this basename: PROT,ALBUMIN,AST,ALT,ALKPHOS,BILITOT,BILIDIR,IBILI in the last 72 hours No results found for this basename: CHOL in the last 72 hours No results found for this basename: PROTIME in the last 72 hours  Imaging: CXR - reviewed.  Cardiac Studies: Tele - Atrial fib with BiV pacing. Assessment/Plan:  1. Atrial fib with an RVR - s/p AV node ablation and BiV PPM. Continue current meds. 2. Acute on chronic systolic/diastolic CHF - he is well compensated. He will discharge home  on current meds except he will stop digoxin and cardizem. 3. Disposition - Usual followup. Will turn down paced rates from 90 to 80 on followup.   LOS: 5 days    Lewayne Bunting 09/09/2011, 11:19 AM

## 2011-09-09 NOTE — Op Note (Signed)
NAME:  Bradley [name], Bradley [name] NO.:  [id number]  MEDICAL RECORD NO.:  [id number]  LOCATION:                                 FACILITY:  PHYSICIAN:  Doylene Canning. Ladona Ridgel, MD    DATE OF BIRTH:  11-Aug-1941  DATE OF PROCEDURE:  09/08/2011 DATE OF DISCHARGE:                              OPERATIVE REPORT   PROCEDURE PERFORMED:  Insertion of a BiV pacemaker followed with coronary sinus venography, followed by AV node ablation.  INTRODUCTION:  The patient is a 70 year old male with a tachycardia- induced cardiomyopathy.  His ejection fraction in the last year has gone from 60% down to 35%.  His heart failure has continued to worsen.  He has had 3 hospitalizations in the last 8 months with decompensated congestive heart failure, associated with AFib and a rapid ventricular response.  The patient denies noncompliance with his medications.  He denies sodium indiscretion.  He is now referred for AV node ablation and BiV pacemaker insertion.  PROCEDURE:  After informed consent was obtained, the patient was taken to the diagnostic EP lab in a fasting state.  After usual preparation and draping, intravenous fentanyl and midazolam were given for sedation. 30 mL lidocaine was infiltrated into the left infraclavicular region.  A 6-cm incision was carried out over this region.  Electrocautery was utilized to dissect down to the fascial plane.  The left subclavian vein was then punctured x2.  The Medtronic model [id number], 58-cm active fixation pacing lead serial number [id number] was advanced into the right ventricle.  Mapping was carried out.  At the final site on the RV septum, the R-waves measured 10 mV, the pacing impedance was 700 ohms, and the threshold was 0.7 V at 0.5 msec.  10 V pacing did not stimulate the diaphragm and with active fixation of the lead, there was a nice current of injury present.  Attention was then turned to placement of the LV lead.  A 6-French hexapolar EP catheter  and a coronary sinus guiding catheter (MB2) was advanced into the right atrium.  The coronary sinus was cannulated without significant difficulty.  Venography of the coronary sinus was then carried out.  This demonstrated posterolateral vein which was selected for LV lead placement.  The Medtronic model 4194, 88-cm passive fixation LV pacing lead was advanced over an angioplasty 014 guidewire and into the lateral vein.  The lead was advanced over the angioplasty guidewire without difficulty.  This lead was at approximately 3 o'clock in the LAO projection and halfway from base to apex.  In this location, 10 V pacing not stimulate the diaphragm.  The threshold was approximately 1 V at 0.5 msec and the pacing impedance was 800 ohms.  With these satisfactory parameters, the guiding catheter was liberated from the LV lead in the usual manner.  A figure-of-eight silk suture was utilized to secure the leads and the sewing sleeves were also secured with silk suture.  At this point, electrocautery was utilized to make a subcutaneous pocket.  Antibiotic irrigation was utilized to irrigate the pocket.  Electrocautery was utilized to assure hemostasis.  The Medtronic BiV pacemaker serial number [id number] was connected to the  RV and LV leads.  The atrial port was capped.  With these findings in place, the generator was placed in the subcutaneous pocket.  The incision was then closed with 2-0 and 3-0 Vicryl.  Benzoin and Steri-Strips were painted on the skin.  A pressure dressing was placed and the patient was prepped for AV node ablation.  After additional sedation with fentanyl and Versed, the right femoral vein was punctured in the usual manner and a 7-French quadripolar ablation catheter was advanced into the region of Koch triangle under fluoroscopic guidance.  The HV interval was approximately 40 msec. Mapping of the of Alycia Rossetti triangle was carried out.  A single RF energy application was  subsequently delivered to the region of Koch triangle resulting in accelerated junctional rhythm and followed by completion with the creation of complete heart block.  At this point, the patient was observed for approximately 15 minutes and during this time, he had no recurrent AV conduction.  There was an intermittent ventricular escape at 30 beats per minute.  The catheter was then removed, hemostasis was assured, and the patient was returned to his room in satisfactory condition.  COMPLICATIONS:  There were no immediate procedure complications.  RESULTS:  This demonstrates successful insertion of a biventricular pacemaker followed by creation of complete heart block, utilizing RF energy applied to the region of the AV node.     Doylene Canning. Ladona Ridgel, MD     GWT/MEDQ  D:  09/08/2011  T:  09/09/2011  Job:  [id number]

## 2011-09-09 NOTE — Progress Notes (Signed)
CARDIAC REHAB PHASE I   PRE:  Rate/Rhythm: 98 paced  BP:  Supine:   Sitting: 135/79  Standing:    SaO2: 98% RA  MODE:  Ambulation: 460 ft   POST:  Rate/Rhythem: 90 paced  BP:  Supine:   Sitting: 150/91  Standing:    SaO2: 97% RA 1055 - 1125 Pt ambulated steady. Tolerated well. Sling in place. Back to room with call bell in reach.  Rosalie Doctor

## 2011-09-09 NOTE — Progress Notes (Signed)
Patient had a 10 beat run of v-tach non sustained. BP:144/79, HR:89. Patient states that he feels fine. Dr. Charm Barges notified. No new orders given.

## 2011-09-09 NOTE — Progress Notes (Signed)
CSW met with patient to complete a psychosocial assessment. Patient lives with his wife and has a good support system. Patient was well educated on management of his heart failure and reported no psychosocial concerns or barriers to care.. Clinical Social Worker will sign off for now as social work intervention is no longer needed.

## 2011-09-09 NOTE — Progress Notes (Signed)
Pt discharged in stable condition home with all belongings and family. Medications and discharge instructions reviewed prior to discharge. 

## 2011-09-09 NOTE — Progress Notes (Signed)
ANTICOAGULATION CONSULT NOTE - Follow Up Consult  Pharmacy Consult for Coumadin Indication: atrial fibrillation  Allergies  Allergen Reactions  . Avelox (Moxifloxacin Hydrochloride) Itching    Headache  . Tadalafil     REACTION: HA, backache    Patient Measurements: Height: 5\' 9"  (175.3 cm) Weight: 203 lb 4.2 oz (92.2 kg) (scale B) IBW/kg (Calculated) : 70.7   Vital Signs: Temp: 97 F (36.1 C) (11/29 1401) Temp src: Oral (11/29 1401) BP: 139/84 mmHg (11/29 1401) Pulse Rate: 90  (11/29 1401)  Labs:  Basename 09/09/11 1135 09/08/11 0525 09/07/11 0524  HGB -- -- 14.2  HCT -- -- 41.1  PLT -- -- 229  APTT -- -- --  LABPROT 19.4* 23.0* 20.9*  INR 1.61* 2.00* 1.77*  HEPARINUNFRC -- -- --  CREATININE -- -- 0.83  CKTOTAL -- -- --  CKMB -- -- --  TROPONINI -- -- --   Estimated Creatinine Clearance: 92.9 ml/min (by C-G formula based on Cr of 0.83).   Medications:  Scheduled:     . acetaminophen  1,000 mg Oral Q6H  . aspirin  325 mg Oral Daily  . dextromethorphan-guaiFENesin  1 tablet Oral BID  . famotidine  20 mg Oral QHS  . fentaNYL      . fluticasone  1 spray Each Nare Daily  . furosemide  40 mg Intravenous Q12H  . glyBURIDE  10 mg Oral BID WC  . lidocaine      . linagliptin  5 mg Oral Daily  . losartan  100 mg Oral Daily  . metoprolol  100 mg Oral BID  . midazolam      . niacin  1,000 mg Oral QHS  . pantoprazole  40 mg Oral Q1200  . simvastatin  10 mg Oral QHS  . DISCONTD: ceFAZolin (ANCEF) IV  2 g Intravenous On Call  . DISCONTD: digoxin  250 mcg Oral Daily  . DISCONTD: diltiazem  180 mg Oral Daily  . DISCONTD: doxycycline  100 mg Oral Q12H  . DISCONTD: gentamicin irrigation  80 mg Irrigation On Call  . DISCONTD: insulin aspart  0-5 Units Subcutaneous QHS  . DISCONTD: insulin aspart  0-9 Units Subcutaneous TID WC  . DISCONTD: ipratropium  0.5 mg Nebulization Q6H  . DISCONTD: levalbuterol  0.63 mg Nebulization Q6H  . DISCONTD: multivitamin  1 tablet  Oral Daily  . DISCONTD: sodium chloride  3 mL Intravenous Q12H  . DISCONTD: zolpidem  10 mg Oral QHS    Assessment: 70 YOM on Coumadin for Afib.  INR decreased to subtherapeutic level today.  No documentation of dose ordered/given yesterday.  Noted s/p ablation and pacemaker implantation.  Goal of Therapy:  INR 2-3   Plan:  1) Coumadin 7.5mg  PO today. 2)  INR in AM   Bradley [name], Bradley [name] 09/09/2011,2:20 PM

## 2011-09-09 NOTE — Discharge Summary (Signed)
Discharge Summary   Patient ID: Bradley [name] MRN: [id number], DOB/AGE: 10-21-1940 70 y.o. Admit date: 09/04/2011 D/C date:     09/09/2011   Primary Discharge Diagnoses:  1. Atrial fibrillation - s/p AV node ablation & BiV PPM implantation 09/08/11 (op dictation pending) 2. Chronic CHF, thought primarily to be non-systolic although EF down - (EF 45-50% 12/2010, down to 35-40% 09/05/11) - [facility] 2008: reportedly no significant CAD  - Nuc stress (01/2011): evidence of old small/moderate infero-apical scar. Wall motion not assessed. No evident ischemia. 3. Acute COPD exacerbation 4. Elevated ESR  Secondary Discharge Diagnoses:  1. HTN 2. HL  3. DM 4. BPH 5. COPD 6. Pleural effusion s/p decortication in 2008  Hospital Course: 70 y/o M with history of CHF, atrial fibrillation presented with SOB worsening over the last 2 months with worsening dyspnea the past 2-3 days. He denied any fever, chills, or syncope. Cardiology was asked to see the patient regarding atrial fibrillation and heart failure. BNP was 1595 and CXR was c/w pulm vascular congestion. He was treated with IV diuretics. His afib was fairly rate controlled except with exertion, and his propensity for RVR was felt related to volume overload and ADHF. Pulmonary also saw the patient and he was placed on nebs, fluticasone, doxycycline. He had also had a 20lb weight loss in the last year, and was instructed by Dr. Graciela Husbands to f/u with PCP.  ESR was 35 (also known to be elevated in the past as well). BB was uptitrated. 2D echo showed EF 35-40% with WMA (see below). TSH was normal. His abx were able to be discontinued yesterday. Dr. Ladona Ridgel ultimately saw the patient and felt that he was a good candidate for BiV PPM and AV node ablation. This was performed 09/08/11. The patient had no immediate complications. Dr. Ladona Ridgel has seen and examined him today and feels he is stable for discharge.  Discharge Vitals: Blood pressure 139/84, pulse 90,  temperature 97 F (36.1 C), temperature source Oral, resp. rate 22, height 5\' 9"  (1.753 m), weight 203 lb 4.2 oz (92.2 kg), SpO2 94.00%.  Labs: Lab Results  Component Value Date   WBC 8.7 09/07/2011   HGB 14.2 09/07/2011   HCT 41.1 09/07/2011   MCV 90.5 09/07/2011   PLT 229 09/07/2011    Lab 09/07/11 0524 09/04/11 1200  NA 140 --  K 3.9 --  CL 101 --  CO2 32 --  BUN 21 --  CREATININE 0.83 --  CALCIUM 8.8 --  PROT -- 6.9  BILITOT -- 1.1  ALKPHOS -- 66  ALT -- 35  AST -- 18  GLUCOSE 103* --   No results found for this basename: CKTOTAL:4,CKMB:4,TROPONINI:4 in the last 72 hours Lab Results  Component Value Date   CHOL 129 09/04/2011   HDL 40 09/04/2011   LDLCALC 68 09/04/2011   TRIG 106 09/04/2011   Lab Results  Component Value Date   DDIMER <0.22 09/04/2011    Diagnostic Studies/Procedures:  1. 2D echo - 09/05/11 - Left ventricle: The cavity size was normal. There was moderate concentric hypertrophy. Systolic function was moderately reduced. The estimated ejection fraction was in the range of 35% to 40%. Moderate diffuse hypokinesis. Possible hypokinesis of the basal-midanteroseptal myocardium. - Mitral valve: Mild regurgitation. - Left atrium: The atrium was moderately to severely dilated. - Pulmonary arteries: Systolic pressure was mildly increased. PA peak pressure: 39mm Hg (S). Dg Chest 2 View  2. 09/09/2011  .  IMPRESSION: Dual lead permanent pacemaker  now present.  Stable mild cardiomegaly.  No pneumothorax.    3. BiVPPM implantation this admission - see report.  Discharge Medications   Current Discharge Medication List    CONTINUE these medications which have CHANGED   Details  aspirin 81 MG tablet Take 1 tablet (81 mg total) by mouth daily.    famotidine (PEPCID) 20 MG tablet Take 1 tablet (20 mg total) by mouth at bedtime. This medicine is a similar kind of medicine as Protonix. Please contact the doctor that prescribed this to make sure they want you to  take both.    furosemide (LASIX) 40 MG tablet Take 1.5 tablets (60 mg total) by mouth daily.    metoprolol (LOPRESSOR) 100 MG tablet Take 1 tablet (100 mg total) by mouth 2 (two) times daily. Qty: 60 tablet, Refills: 6      CONTINUE these medications which have NOT CHANGED   Details  albuterol (PROVENTIL HFA;VENTOLIN HFA) 108 (90 BASE) MCG/ACT inhaler Inhale 2 puffs into the lungs every 6 (six) hours as needed for wheezing. Qty: 1 Inhaler, Refills: 0    dextromethorphan-guaiFENesin (MUCINEX DM) 30-600 MG per 12 hr tablet Take 1 tablet by mouth as needed. For congestion    fluticasone (FLONASE) 50 MCG/ACT nasal spray Place 1 spray into the nose daily.      glyBURIDE (DIABETA) 5 MG tablet Take 10 mg by mouth 2 (two) times daily with a meal.      losartan (COZAAR) 100 MG tablet Take 1 tablet (100 mg total) by mouth daily. Qty: 90 tablet, Refills: 3    Multiple Vitamin (MULTIVITAMIN) tablet Take 1 tablet by mouth daily.      niacin-lovastatin (ADVICOR) 1000-20 MG 24 hr tablet Take 1 tablet by mouth at bedtime.      pantoprazole (PROTONIX) 40 MG tablet Take 40 mg by mouth every morning.      sitaGLIPtin (JANUVIA) 100 MG tablet Take 1 tablet (100 mg total) by mouth daily. Qty: 90 tablet, Refills: 3    warfarin (COUMADIN) 5 MG tablet Take 5 mg by mouth at bedtime.     zolpidem (AMBIEN) 10 MG tablet Take 10 mg by mouth at bedtime.     clobetasol (TEMOVATE) 0.05 % cream Apply topically as directed.      HYDROcodone-acetaminophen (NORCO) 5-325 MG per tablet Take 1 tablet by mouth every 4 (four) hours as needed. For pain      STOP taking these medications     digoxin (LANOXIN) 0.25 MG tablet      diltiazem (CARDIZEM CD) 180 MG 24 hr capsule      metFORMIN (GLUCOPHAGE) 850 MG tablet       Per discussion with Dr. Ladona Ridgel, d/c Digoxin/Diltiazem and keep metoprolol at current dose. Dr. Ladona Ridgel would like to change Lasix to 60mg  po daily. Metformin was d/c'd secondary to CHF.    Disposition   The patient will be discharged in stable condition to home. Discharge Orders    Future Appointments: Provider: Department: Dept Phone: Center:   09/24/2011 10:30 AM Sandrea Hughs, MD [facility] [phone] None   10/01/2011 1:45 PM [name], MD [facility] [phone] [facility]     Future Orders Please Complete By Expires   Diet - low sodium heart healthy      Increase activity slowly      Comments:   See attached sheet   Discharge wound care:      Comments:   See attached sheet     Follow-up Information  Follow up with [facility]. (Our office will call to arrange follow-up of your INR (Coumadin level), wound/device check, and follow-up appointments. Call us if you have not heard from [place] by Monday morning.)    Contact information:   [address] [place] [address]       Follow up with [name], MD. (For your weight loss and other chronic medical issues. One of your diabetes medicines has been stopped (metformin) due to your heart function and therefore your diabetes regimen may need to be adjusted.)    Contact information:   [address] [facility] [address] [place] [address] [phone]        I called our office to schedule f/u INR for Monday 12/3, wound check & appt 7-10 days, and f/u with Dr. Ladona Ridgel 3 months.    Duration of Discharge Encounter: Greater than 30 minutes including physician and PA time.  Signed, Ronie Spies PA-C 09/09/2011, 4:18 PM

## 2011-09-10 ENCOUNTER — Telehealth: Payer: Self-pay | Admitting: Internal Medicine

## 2011-09-10 NOTE — Telephone Encounter (Signed)
Refill for ambian - [facility] - patient didn't get refill at last ov

## 2011-09-10 NOTE — Telephone Encounter (Signed)
Spoke with wife and appt scheduled.

## 2011-09-10 NOTE — Telephone Encounter (Signed)
Per after hours pt to have coumadin Monday or Tuesday, wife can't drive and pt not suppose to drive for a week, ok to put it off a week?

## 2011-09-13 ENCOUNTER — Telehealth: Payer: Self-pay | Admitting: Internal Medicine

## 2011-09-13 ENCOUNTER — Other Ambulatory Visit: Payer: Self-pay

## 2011-09-13 MED ORDER — ZOLPIDEM TARTRATE 10 MG PO TABS: 10.0000 mg | ORAL_TABLET | Freq: Every day | ORAL | Status: DC

## 2011-09-13 NOTE — Telephone Encounter (Signed)
Pt's wife called again requesting Rx for ambien to be sent to [facility]

## 2011-09-13 NOTE — Telephone Encounter (Signed)
Spoke with patient.  He is status post CRTP and AV node ablation on 09-08-11 by Dr Ladona Ridgel.  He questioned how he was allowed to move his arm at this point and how many times he should do arm exercises as listed on discharge paperwork.  Advised patient that at this point (5 days post-op), he could move his arm however he liked, just to refrain from heavy lifting or reaching high or behind him for 4 more weeks.  Advised that walking was fine but to avoid any more strenuous exercise until evaluated in device clinic on Thursday for wound check.  Pt states he is feeling well post discharge.  His breathing is better.  He does complain of a headache that is relieved with Tylenol.    All questions answered for patient as above.    Gypsy Balsam, RN, BSN 09/13/2011 10:00 AM

## 2011-09-13 NOTE — Telephone Encounter (Signed)
Rx printed and faxed.  

## 2011-09-13 NOTE — Telephone Encounter (Signed)
Last OV 08/24/11. Last filled 11/17/09

## 2011-09-13 NOTE — Telephone Encounter (Signed)
Pt had device placed and was told to exercise but not how much or for how long, pls call

## 2011-09-13 NOTE — Telephone Encounter (Signed)
Ok 90, 1 RF 

## 2011-09-15 LAB — HM DIABETES EYE EXAM

## 2011-09-16 ENCOUNTER — Encounter: Payer: Self-pay | Admitting: *Deleted

## 2011-09-16 ENCOUNTER — Ambulatory Visit (INDEPENDENT_AMBULATORY_CARE_PROVIDER_SITE_OTHER): Payer: Medicare Other | Admitting: *Deleted

## 2011-09-16 ENCOUNTER — Other Ambulatory Visit: Payer: Self-pay | Admitting: Internal Medicine

## 2011-09-16 ENCOUNTER — Encounter: Payer: Self-pay | Admitting: Internal Medicine

## 2011-09-16 DIAGNOSIS — I428 Other cardiomyopathies: Secondary | ICD-10-CM

## 2011-09-16 DIAGNOSIS — Z7901 Long term (current) use of anticoagulants: Secondary | ICD-10-CM

## 2011-09-16 DIAGNOSIS — I495 Sick sinus syndrome: Secondary | ICD-10-CM | POA: Insufficient documentation

## 2011-09-16 DIAGNOSIS — I4891 Unspecified atrial fibrillation: Secondary | ICD-10-CM

## 2011-09-16 LAB — PACEMAKER DEVICE OBSERVATION
AL IMPEDENCE PM: 4047 Ohm
ATRIAL PACING PM: 0
BATTERY VOLTAGE: 3.0662 V
LV LEAD THRESHOLD: 1 V
RV LEAD AMPLITUDE: 7.5 mv
RV LEAD IMPEDENCE PM: 532 Ohm
RV LEAD THRESHOLD: 1 V
VENTRICULAR PACING PM: 0

## 2011-09-16 LAB — POCT INR: INR: 2.2

## 2011-09-16 NOTE — Progress Notes (Signed)
Wound check-PPM 

## 2011-09-20 ENCOUNTER — Inpatient Hospital Stay (HOSPITAL_BASED_OUTPATIENT_CLINIC_OR_DEPARTMENT_OTHER)
Admission: EM | Admit: 2011-09-20 | Discharge: 2011-09-23 | DRG: 292 | Disposition: A | Discharge status: Home / Self Care | Payer: Medicare Other | Attending: Cardiovascular Disease | Admitting: Cardiovascular Disease

## 2011-09-20 ENCOUNTER — Encounter (HOSPITAL_BASED_OUTPATIENT_CLINIC_OR_DEPARTMENT_OTHER): Payer: Self-pay | Admitting: *Deleted

## 2011-09-20 ENCOUNTER — Emergency Department (INDEPENDENT_AMBULATORY_CARE_PROVIDER_SITE_OTHER): Payer: Medicare Other

## 2011-09-20 ENCOUNTER — Other Ambulatory Visit: Payer: Self-pay

## 2011-09-20 ENCOUNTER — Emergency Department (HOSPITAL_BASED_OUTPATIENT_CLINIC_OR_DEPARTMENT_OTHER): Payer: Medicare Other

## 2011-09-20 ENCOUNTER — Telehealth: Payer: Self-pay | Admitting: Internal Medicine

## 2011-09-20 DIAGNOSIS — G4700 Insomnia, unspecified: Secondary | ICD-10-CM | POA: Diagnosis present

## 2011-09-20 DIAGNOSIS — Z950 Presence of cardiac pacemaker: Secondary | ICD-10-CM

## 2011-09-20 DIAGNOSIS — I10 Essential (primary) hypertension: Secondary | ICD-10-CM | POA: Diagnosis present

## 2011-09-20 DIAGNOSIS — I509 Heart failure, unspecified: Secondary | ICD-10-CM

## 2011-09-20 DIAGNOSIS — I2789 Other specified pulmonary heart diseases: Secondary | ICD-10-CM

## 2011-09-20 DIAGNOSIS — Z7982 Long term (current) use of aspirin: Secondary | ICD-10-CM

## 2011-09-20 DIAGNOSIS — I5023 Acute on chronic systolic (congestive) heart failure: Principal | ICD-10-CM | POA: Diagnosis present

## 2011-09-20 DIAGNOSIS — J90 Pleural effusion, not elsewhere classified: Secondary | ICD-10-CM | POA: Diagnosis present

## 2011-09-20 DIAGNOSIS — I428 Other cardiomyopathies: Secondary | ICD-10-CM | POA: Diagnosis present

## 2011-09-20 DIAGNOSIS — E785 Hyperlipidemia, unspecified: Secondary | ICD-10-CM | POA: Diagnosis present

## 2011-09-20 DIAGNOSIS — E876 Hypokalemia: Secondary | ICD-10-CM

## 2011-09-20 DIAGNOSIS — R0602 Shortness of breath: Secondary | ICD-10-CM

## 2011-09-20 DIAGNOSIS — E119 Type 2 diabetes mellitus without complications: Secondary | ICD-10-CM | POA: Diagnosis present

## 2011-09-20 DIAGNOSIS — I517 Cardiomegaly: Secondary | ICD-10-CM

## 2011-09-20 DIAGNOSIS — J4489 Other specified chronic obstructive pulmonary disease: Secondary | ICD-10-CM | POA: Diagnosis present

## 2011-09-20 DIAGNOSIS — I482 Chronic atrial fibrillation, unspecified: Secondary | ICD-10-CM | POA: Diagnosis present

## 2011-09-20 DIAGNOSIS — J449 Chronic obstructive pulmonary disease, unspecified: Secondary | ICD-10-CM | POA: Diagnosis present

## 2011-09-20 DIAGNOSIS — Z7901 Long term (current) use of anticoagulants: Secondary | ICD-10-CM

## 2011-09-20 DIAGNOSIS — R599 Enlarged lymph nodes, unspecified: Secondary | ICD-10-CM | POA: Diagnosis present

## 2011-09-20 DIAGNOSIS — R591 Generalized enlarged lymph nodes: Secondary | ICD-10-CM

## 2011-09-20 DIAGNOSIS — R079 Chest pain, unspecified: Secondary | ICD-10-CM

## 2011-09-20 DIAGNOSIS — R51 Headache: Secondary | ICD-10-CM | POA: Diagnosis present

## 2011-09-20 DIAGNOSIS — Z888 Allergy status to other drugs, medicaments and biological substances status: Secondary | ICD-10-CM

## 2011-09-20 DIAGNOSIS — I4891 Unspecified atrial fibrillation: Secondary | ICD-10-CM | POA: Diagnosis present

## 2011-09-20 DIAGNOSIS — R911 Solitary pulmonary nodule: Secondary | ICD-10-CM

## 2011-09-20 DIAGNOSIS — Z9981 Dependence on supplemental oxygen: Secondary | ICD-10-CM

## 2011-09-20 DIAGNOSIS — I5031 Acute diastolic (congestive) heart failure: Secondary | ICD-10-CM

## 2011-09-20 DIAGNOSIS — Z79899 Other long term (current) drug therapy: Secondary | ICD-10-CM

## 2011-09-20 HISTORY — DX: Heart failure, unspecified: I50.9

## 2011-09-20 HISTORY — DX: Unspecified atrial fibrillation: I48.91

## 2011-09-20 LAB — PROTIME-INR
INR: 2.21 — ABNORMAL HIGH (ref 0.00–1.49)
Prothrombin Time: 24.9 seconds — ABNORMAL HIGH (ref 11.6–15.2)

## 2011-09-20 LAB — GLUCOSE, CAPILLARY
Glucose-Capillary: 141 mg/dL — ABNORMAL HIGH (ref 70–99)
Glucose-Capillary: 186 mg/dL — ABNORMAL HIGH (ref 70–99)

## 2011-09-20 LAB — D-DIMER, QUANTITATIVE: D-Dimer, Quant: 0.73 ug/mL-FEU — ABNORMAL HIGH (ref 0.00–0.48)

## 2011-09-20 LAB — TROPONIN I: Troponin I: <0.3 ng/mL (ref <0.30)

## 2011-09-20 LAB — CBC
HCT: 36.7 % — ABNORMAL LOW (ref 39.0–52.0)
Hemoglobin: 12.6 g/dL — ABNORMAL LOW (ref 13.0–17.0)
MCH: 31 pg (ref 26.0–34.0)
MCHC: 34.3 g/dL (ref 30.0–36.0)
MCV: 90.4 fL (ref 78.0–100.0)
Platelets: 249 10*3/uL (ref 150–400)
RBC: 4.06 MIL/uL — ABNORMAL LOW (ref 4.22–5.81)
RDW: 14.4 % (ref 11.5–15.5)
WBC: 8.5 10*3/uL (ref 4.0–10.5)

## 2011-09-20 LAB — BASIC METABOLIC PANEL
BUN: 12 mg/dL (ref 6–23)
CO2: 29 mEq/L (ref 19–32)
Calcium: 8.8 mg/dL (ref 8.4–10.5)
Chloride: 103 mEq/L (ref 96–112)
Creatinine, Ser: 0.8 mg/dL (ref 0.50–1.35)
GFR calc Af Amer: >90 mL/min (ref >90)
GFR calc non Af Amer: 88 mL/min — ABNORMAL LOW (ref >90)
Glucose, Bld: 134 mg/dL — ABNORMAL HIGH (ref 70–99)
Potassium: 4.1 mEq/L (ref 3.5–5.1)
Sodium: 140 mEq/L (ref 135–145)

## 2011-09-20 LAB — PRO B NATRIURETIC PEPTIDE: Pro B Natriuretic peptide (BNP): 3004 pg/mL — ABNORMAL HIGH (ref 0–125)

## 2011-09-20 MED ORDER — ASPIRIN 81 MG PO CHEW
81.0000 mg | CHEWABLE_TABLET | Freq: Every day | ORAL | Status: DC
  Administered 2011-09-20 – 2011-09-23 (×4): 81 mg via ORAL
  Filled 2011-09-20 (×4): qty 1

## 2011-09-20 MED ORDER — FLUTICASONE PROPIONATE 50 MCG/ACT NA SUSP
2.0000 | Freq: Every day | NASAL | Status: DC
  Filled 2011-09-20: qty 16

## 2011-09-20 MED ORDER — FUROSEMIDE 10 MG/ML IJ SOLN
80.0000 mg | Freq: Once | INTRAMUSCULAR | Status: AC
  Administered 2011-09-20: 80 mg via INTRAVENOUS
  Filled 2011-09-20: qty 8

## 2011-09-20 MED ORDER — THERA M PLUS PO TABS
1.0000 | ORAL_TABLET | Freq: Every day | ORAL | Status: DC
  Administered 2011-09-21 – 2011-09-23 (×3): 1 via ORAL
  Filled 2011-09-20 (×3): qty 1

## 2011-09-20 MED ORDER — SODIUM CHLORIDE 0.9 % IJ SOLN
3.0000 mL | Freq: Two times a day (BID) | INTRAMUSCULAR | Status: DC
  Administered 2011-09-20 – 2011-09-23 (×6): 3 mL via INTRAVENOUS

## 2011-09-20 MED ORDER — METOPROLOL TARTRATE 100 MG PO TABS
100.0000 mg | ORAL_TABLET | Freq: Two times a day (BID) | ORAL | Status: DC
  Administered 2011-09-20 – 2011-09-23 (×6): 100 mg via ORAL
  Filled 2011-09-20 (×7): qty 1

## 2011-09-20 MED ORDER — GLYBURIDE 5 MG PO TABS
10.0000 mg | ORAL_TABLET | Freq: Two times a day (BID) | ORAL | Status: DC
  Administered 2011-09-21 – 2011-09-23 (×5): 10 mg via ORAL
  Filled 2011-09-20 (×7): qty 2

## 2011-09-20 MED ORDER — LOSARTAN POTASSIUM 50 MG PO TABS
100.0000 mg | ORAL_TABLET | Freq: Every day | ORAL | Status: DC
  Administered 2011-09-21 – 2011-09-23 (×3): 100 mg via ORAL
  Filled 2011-09-20 (×3): qty 2

## 2011-09-20 MED ORDER — ACETAMINOPHEN 325 MG PO TABS
650.0000 mg | ORAL_TABLET | ORAL | Status: DC | PRN
  Administered 2011-09-20: 650 mg via ORAL
  Filled 2011-09-20: qty 2

## 2011-09-20 MED ORDER — SODIUM CHLORIDE 0.9 % IJ SOLN: 3.0000 mL | INTRAMUSCULAR | Status: DC | PRN

## 2011-09-20 MED ORDER — LINAGLIPTIN 5 MG PO TABS
5.0000 mg | ORAL_TABLET | Freq: Every day | ORAL | Status: DC
  Administered 2011-09-21 – 2011-09-23 (×3): 5 mg via ORAL
  Filled 2011-09-20 (×3): qty 1

## 2011-09-20 MED ORDER — SIMVASTATIN 10 MG PO TABS
10.0000 mg | ORAL_TABLET | Freq: Every day | ORAL | Status: DC
  Administered 2011-09-20 – 2011-09-22 (×3): 10 mg via ORAL
  Filled 2011-09-20 (×4): qty 1

## 2011-09-20 MED ORDER — HYDROCODONE-ACETAMINOPHEN 5-325 MG PO TABS: 1.0000 | ORAL_TABLET | ORAL | Status: DC | PRN

## 2011-09-20 MED ORDER — DM-GUAIFENESIN ER 30-600 MG PO TB12
1.0000 | ORAL_TABLET | Freq: Two times a day (BID) | ORAL | Status: DC | PRN
  Filled 2011-09-20: qty 1

## 2011-09-20 MED ORDER — INSULIN ASPART 100 UNIT/ML ~~LOC~~ SOLN
0.0000 [IU] | Freq: Three times a day (TID) | SUBCUTANEOUS | Status: DC
  Administered 2011-09-21 – 2011-09-22 (×2): 1 [IU] via SUBCUTANEOUS
  Administered 2011-09-22: 2 [IU] via SUBCUTANEOUS
  Administered 2011-09-23: 1 [IU] via SUBCUTANEOUS
  Filled 2011-09-20 (×2): qty 3

## 2011-09-20 MED ORDER — IOHEXOL 350 MG/ML SOLN
80.0000 mL | Freq: Once | INTRAVENOUS | Status: AC | PRN
  Administered 2011-09-20: 80 mL via INTRAVENOUS

## 2011-09-20 MED ORDER — SODIUM CHLORIDE 0.9 % IV SOLN: 250.0000 mL | INTRAVENOUS | Status: DC | PRN

## 2011-09-20 MED ORDER — NITROGLYCERIN 2 % TD OINT
1.0000 [in_us] | TOPICAL_OINTMENT | Freq: Once | TRANSDERMAL | Status: AC
  Administered 2011-09-20: 1 [in_us] via TOPICAL
  Filled 2011-09-20: qty 30

## 2011-09-20 MED ORDER — PANTOPRAZOLE SODIUM 40 MG PO TBEC
40.0000 mg | DELAYED_RELEASE_TABLET | ORAL | Status: DC
  Administered 2011-09-21 – 2011-09-23 (×3): 40 mg via ORAL
  Filled 2011-09-20 (×3): qty 1

## 2011-09-20 MED ORDER — FUROSEMIDE 10 MG/ML IJ SOLN
80.0000 mg | Freq: Once | INTRAMUSCULAR | Status: DC
  Filled 2011-09-20: qty 8

## 2011-09-20 MED ORDER — WARFARIN SODIUM 5 MG PO TABS
5.0000 mg | ORAL_TABLET | Freq: Every day | ORAL | Status: DC
  Administered 2011-09-21 – 2011-09-22 (×2): 5 mg via ORAL
  Filled 2011-09-20 (×3): qty 1

## 2011-09-20 MED ORDER — ALBUTEROL SULFATE HFA 108 (90 BASE) MCG/ACT IN AERS
2.0000 | INHALATION_SPRAY | Freq: Four times a day (QID) | RESPIRATORY_TRACT | Status: DC | PRN
  Filled 2011-09-20: qty 6.7

## 2011-09-20 MED ORDER — NIACIN-LOVASTATIN ER 1000-20 MG PO TB24: 1.0000 | ORAL_TABLET | Freq: Every day | ORAL | Status: DC

## 2011-09-20 MED ORDER — NIACIN ER 500 MG PO CPCR
1000.0000 mg | ORAL_CAPSULE | Freq: Every day | ORAL | Status: DC
  Administered 2011-09-20 – 2011-09-22 (×3): 1000 mg via ORAL
  Filled 2011-09-20 (×4): qty 2

## 2011-09-20 MED ORDER — ONDANSETRON HCL 4 MG/2ML IJ SOLN: 4.0000 mg | Freq: Four times a day (QID) | INTRAMUSCULAR | Status: DC | PRN

## 2011-09-20 NOTE — Telephone Encounter (Signed)
No answer at home. Just kept ringing. Called cell phone and LMTCB Mylo Red RN

## 2011-09-20 NOTE — Progress Notes (Signed)
ANTICOAGULATION CONSULT NOTE - Initial Consult  Pharmacy Consult for coumadin Indication: atrial fibrillation  Allergies  Allergen Reactions  . Avelox (Moxifloxacin Hydrochloride) Itching    Headache  . Tadalafil     REACTION: HA, backache    Patient Measurements: Height: 5\' 8"  (172.7 cm) Weight: 211 lb (95.709 kg) IBW/kg (Calculated) : 68.4    Vital Signs: Temp: 97.6 F (36.4 C) (12/10 1830) Temp src: Oral (12/10 1830) BP: 178/99 mmHg (12/10 1830) Pulse Rate: 80  (12/10 1830)  Labs:  Basename 09/20/11 1332  HGB 12.6*  HCT 36.7*  PLT 249  APTT --  LABPROT 24.9*  INR 2.21*  HEPARINUNFRC --  CREATININE 0.80  CKTOTAL --  CKMB --  TROPONINI <0.30   Estimated Creatinine Clearance: 96.4 ml/min (by C-G formula based on Cr of 0.8).  Medical History: Past Medical History  Diagnosis Date  . HTN (hypertension)   . HLD (hyperlipidemia)   . DM (diabetes mellitus) 1999    adult onset  . BPH (benign prostatic hyperplasia)     Saw Dr [name] 2004, normal renal u/s  . Leg ulcer 2011  . CHF (congestive heart failure)     Thought primarily to be non-systolic although EF down (EF 16-10% 12/2010, down to 35-40% 09/05/11), cath 2008 with no CAD, nuclear study 07/2011 showing Small area of reversibility in the distal ant/lat wall the left ventricle suspicious for ischemia/septal wall HK but felt to be low risk  (per D/C Summary 07/2011)  . Pleural effusion 2008    S/p decortication  . Insomnia   . Atrial fibrillation     s/p AV node ablation & BiV PPM implantation 09/08/11 (op dictation pending)  . Sinoatrial node dysfunction     Medications:  Prescriptions prior to admission  Medication Sig Dispense Refill  . albuterol (PROVENTIL HFA;VENTOLIN HFA) 108 (90 BASE) MCG/ACT inhaler Inhale 2 puffs into the lungs every 6 (six) hours as needed for wheezing.  1 Inhaler  0  . aspirin 81 MG chewable tablet Chew 81 mg by mouth daily.        . clobetasol (TEMOVATE) 0.05 % cream  Apply 1 application topically 2 (two) times daily. To affected area      . dextromethorphan-guaiFENesin (MUCINEX DM) 30-600 MG per 12 hr tablet Take 1 tablet by mouth as needed. For congestion      . famotidine (PEPCID) 20 MG tablet Take 1 tablet (20 mg total) by mouth at bedtime. This medicine is a similar kind of medicine as Protonix. Please contact the doctor that prescribed this to make sure they want you to take both.      . fluticasone (FLONASE) 50 MCG/ACT nasal spray Place 1 spray into the nose daily.       . furosemide (LASIX) 40 MG tablet Take 60 mg by mouth daily.        Marland Kitchen glyBURIDE (DIABETA) 5 MG tablet Take 10 mg by mouth 2 (two) times daily with a meal.       . HYDROcodone-acetaminophen (NORCO) 5-325 MG per tablet Take 1 tablet by mouth every 4 (four) hours as needed. For pain      . losartan (COZAAR) 100 MG tablet Take 1 tablet (100 mg total) by mouth daily.  90 tablet  3  . metoprolol (LOPRESSOR) 100 MG tablet Take 1 tablet (100 mg total) by mouth 2 (two) times daily.  60 tablet  6  . Multiple Vitamins-Minerals (MULTIVITAMINS THER. W/MINERALS) TABS Take 1 tablet by mouth daily.        Marland Kitchen  niacin-lovastatin (ADVICOR) 1000-20 MG 24 hr tablet Take 1 tablet by mouth at bedtime.        . pantoprazole (PROTONIX) 40 MG tablet Take 40 mg by mouth every morning.        . sitaGLIPtin (JANUVIA) 100 MG tablet Take 1 tablet (100 mg total) by mouth daily.  90 tablet  3  . warfarin (COUMADIN) 5 MG tablet Take 5 mg by mouth at bedtime.       Marland Kitchen zolpidem (AMBIEN) 10 MG tablet Take 10 mg by mouth at bedtime as needed. For sleep      . DISCONTD: furosemide (LASIX) 40 MG tablet Take 1.5 tablets (60 mg total) by mouth daily.        Assessment: 70 yo man on coumadin PTA for afib with therapeutic INR. He has already taken his coumadin dose at home today - 5mg  qhs Goal of Therapy:  INR 2-3   Plan:  Daily INR. Continue home dose of 5mg  daily.  Len Childs T 09/20/2011,8:25 PM

## 2011-09-20 NOTE — ED Notes (Signed)
Transported to x ray will obtain labs and start iv upon his return ekg shown to MD

## 2011-09-20 NOTE — ED Notes (Signed)
No IV was in place upon arrival.

## 2011-09-20 NOTE — ED Notes (Signed)
Pacer insertion inserted 3 weeks ago. Had the settings adjusted 4 days ago. Sob since that time.

## 2011-09-20 NOTE — Telephone Encounter (Signed)
Fu call Pt's wife was calling back about husband

## 2011-09-20 NOTE — Telephone Encounter (Signed)
Pt having SOB and headache and trouble sleeping even with sleeping pill, since Thursday, pacer was adjusted,

## 2011-09-20 NOTE — ED Notes (Signed)
Pt returned from ct angio back on monitor

## 2011-09-20 NOTE — H&P (Signed)
History and Physical  Patient ID: Bradley [name] MRN: [id number], SOB: 09/15/1941 70 y.o. Date of Encounter: 09/20/2011, 6:20 PM  Primary Physician: [name], MD, MD Primary Cardiologist: Dr. Ladona Ridgel  Chief Complaint: shortness of breath, sleep disturbance  HPI: [name] is a 70 y/o M with history of CHF, atrial fibrillation who was transferred from [facility] to [facility] with SOB. He was just in the hospital 11/24-11/29 for SOB ultimately felt due to CHF, afib RVR, and COPD exacerbation. He was treated with abx, nebs, and diuretics. His EF was noted to be down to 35-40% from 45-50% 12/2010. He ultimately was felt to be a good candidate for AV node ablation & Bi-V ppm implantation which was done 09/08/11. Digoxin and diltiazem were discontinued during that hospitalization, and he was sent home on Lasix 60mg  bid. He returned to [facility] c/o SOB and sleep disturbance. He states that since his pacer check on 09/16/11 when his rate was changed to 80, he just hasn't felt quite right. Every time he lays down to sleep, he feels a headache like a "banging" sensation in his head and can't seem to keep his eyes closed. His wife found him asleep in the computer chair yesterday, and he found himself in bed this morning without underwear on but can't remember doing either of these two things. He also endorses recent increase in SOB/DOE particularly with stairs. No chest pain whatsoever. He doesn't think he's had syncope or any palpitations. He thinks he has gained about 5lbs since going home.  D-dimer was mildly elevated, so CTA was done showing small amt pericardial fluid, small bilateral pleural effusions, unusual fluid collection @ right apex likely loculated pleural fluid, mosaic pattern likely due to pulmonary edema. No PE. Also showed stable RLL pulm nodule but enlarging supraclavicular, subpectoral, axillary, mediastinal and hilar adenopathy with recommendation that PET CT may be helpful for further  evaluation. Incidentally during his previous hospitalization he was also noted to have recent weight loss & elevated sed rate, but also states his weight has since fluctuated and gone back up.   Past Medical History  Diagnosis Date  . HTN (hypertension)   . HLD (hyperlipidemia)   . DM (diabetes mellitus) 1999    adult onset  . BPH (benign prostatic hyperplasia)     Saw Dr [name] 2004, normal renal u/s  . Leg ulcer 2011  . CHF (congestive heart failure)     Thought primarily to be non-systolic although EF down (EF 40-98% 12/2010, down to 35-40% 09/05/11), cath 2008 with no CAD, nuclear study 07/2011 showing Small area of reversibility in the distal ant/lat wall the left ventricle suspicious for ischemia/septal wall HK but felt to be low risk  (per D/C Summary 07/2011)  . Pleural effusion 2008    S/p decortication  . Insomnia   . Atrial fibrillation     s/p AV node ablation & BiV PPM implantation 09/08/11 (op dictation pending)  . Sinoatrial node dysfunction      Surgical History:  Past Surgical History  Procedure Date  . Pleural scarification   . Lung decortication   . Colonoscopy 03/10/11    normal  . Pneumothorax with fibrothorax      Home Meds: Medication Sig  albuterol (PROVENTIL HFA;VENTOLIN HFA) 108 (90 BASE) MCG/ACT inhaler Inhale 2 puffs into the lungs every 6 (six) hours as needed for wheezing.  aspirin 81 MG tablet Take 1 tablet (81 mg total) by mouth daily.  clobetasol (TEMOVATE) 0.05 % cream Apply  topically as directed.    dextromethorphan-guaiFENesin (MUCINEX DM) 30-600 MG per 12 hr tablet Take 1 tablet by mouth as needed. For congestion  famotidine (PEPCID) 20 MG tablet Take 1 tablet (20 mg total) by mouth at bedtime. This medicine is a similar kind of medicine as Protonix. Please contact the doctor that prescribed this to make sure they want you to take both.  fluticasone (FLONASE) 50 MCG/ACT nasal spray Place 1 spray into the nose daily.    furosemide (LASIX)  40 MG tablet Take 1.5 tablets (60 mg total) by mouth daily.  glyBURIDE (DIABETA) 5 MG tablet Take 10 mg by mouth 2 (two) times daily with a meal.    HYDROcodone-acetaminophen (NORCO) 5-325 MG per tablet Take 1 tablet by mouth every 4 (four) hours as needed. For pain  losartan (COZAAR) 100 MG tablet Take 1 tablet (100 mg total) by mouth daily.  metoprolol (LOPRESSOR) 100 MG tablet Take 1 tablet (100 mg total) by mouth 2 (two) times daily.  Multiple Vitamin (MULTIVITAMIN) tablet Take 1 tablet by mouth daily.    niacin-lovastatin (ADVICOR) 1000-20 MG 24 hr tablet Take 1 tablet by mouth at bedtime.    pantoprazole (PROTONIX) 40 MG tablet Take 40 mg by mouth every morning.    sitaGLIPtin (JANUVIA) 100 MG tablet Take 1 tablet (100 mg total) by mouth daily.  warfarin (COUMADIN) 5 MG tablet Take 5 mg by mouth at bedtime.   zolpidem (AMBIEN) 10 MG tablet Take 1 tablet (10 mg total) by mouth at bedtime.    Allergies:  Allergies  Allergen Reactions  . Avelox (Moxifloxacin Hydrochloride) Itching    Headache  . Tadalafil     REACTION: HA, backache    History   Social History  . Marital Status: Married    Spouse Name: Bradley [name]    Number of Children: 1  . 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 Smoking status: Former Smoker -- 2.5 packs/day for 25 years    Types: Cigarettes    Quit date: 10/11/1980  . Smokeless tobacco: Never Used  . Alcohol Use: Yes     occassionally once a year  . Drug Use: No  . Sexually Active: Not on file   Other Topics Concern  . Not on file   Social History Narrative   Moved from [place] 2003He lives w/ his wife      Family History  Problem Relation Age of Onset  . Diabetes Father   . Breast cancer Maternal Aunt   . Heart attack Neg Hx   . Prostate cancer Neg Hx   . Colon cancer Neg Hx   . Cancer Neg Hx     colon or prostate    Review of Systems: General: negative for chills,  fever, night sweats. Weight changes as above Cardiovascular: negative for chest pain. + dyspnea on exertion/SOB. No edema, orthopnea, palpitations, paroxysmal nocturnal dyspnea Dermatological: negative for rash Respiratory: negative for cough or wheezing Urologic: negative for hematuria Abdominal: negative for nausea, vomiting, diarrhea, bright red blood per rectum, melena, or hematemesis Neurologic: negative for visual changes, syncope, or dizziness All other systems reviewed and are otherwise negative except as noted above.  Labs:   Lab Results  Component Value Date   WBC 8.5 09/20/2011   HGB 12.6* 09/20/2011   HCT 36.7* 09/20/2011   MCV 90.4 09/20/2011   PLT 249 09/20/2011    Lab 09/20/11 1332  NA 140  K 4.1  CL 103  CO2 29  BUN 12  CREATININE 0.80  CALCIUM 8.8  PROT --  BILITOT --  ALKPHOS --  ALT --  AST --  GLUCOSE 134*    Basename 09/20/11 1332  CKTOTAL --  CKMB --  TROPONINI <0.30    Lab Results  Component Value Date   DDIMER 0.73* 09/20/2011    Radiology/Studies:   1. Chest 2 View 09/20/2011  *RADIOLOGY REPORT*  Clinical Data: Shortness of breath  CHEST - 2 VIEW  Comparison: 09/09/2011  Findings: Cardiomegaly again noted.  Dual lead cardiac pacemaker is unchanged in position.  Small bilateral pleural effusion.  Hazy bilateral lower lobe atelectasis or infiltrate.  No convincing pulmonary edema.  IMPRESSION: Cardiomegaly.  Small bilateral pleural effusion.  Hazy bilateral lower lobe atelectasis or infiltrate.  No convincing pulmonary edema.    2. Ct Angio Chest W/cm &/or Wo Cm 09/20/2011  *RADIOLOGY REPORT*  Clinical Data:  Shortness of breath.  CT ANGIOGRAPHY CHEST WITH CONTRAST  Technique:  Multidetector CT imaging of the chest was performed using the standard protocol during bolus administration of intravenous contrast.  Multiplanar CT image reconstructions including MIPs were obtained to evaluate the vascular anatomy.  Contrast: 80mL OMNIPAQUE IOHEXOL 350  MG/ML IV SOLN  Comparison:  CT scan 05/25/2011.  Findings:  The chest wall is stable.  The permanent left-sided pacemaker is noted.  There are scattered small supraclavicular, subpectoral and axillary lymph nodes.  These are stable.  The bony thorax is intact and appears stable.  No destructive bony lesions or spinal canal compromise.  The heart is enlarged but stable.  No pericardial effusion. Scattered pericardial fluid collections are noted.  There are enlarged and enlarging mediastinal and hilar lymph nodes.  The aorta is normal in caliber.  Mild atherosclerotic changes.  No dissection.  The pulmonary arterial tree is well opacified.  No filling defects to suggest pulmonary emboli.  Coronary artery calcifications are noted.  The esophagus is grossly normal.  Examination of the lung parenchyma demonstrates a lobulated right apical mass.  This may be loculated pleural fluid at the apex. There was not present on the prior study.  Some fluid is also noted in the left major fissure.  There is a mosaic pattern density in the lungs.  I believe this is most likely asymmetric pulmonary edema.  There are small bilateral pleural effusions.  No worrisome pulmonary mass lesions.  The right lower lobe nodule is not well demonstrated and appears stable.  The upper abdomen demonstrates no significant findings.  There is reflux of contrast down the IVC which may be due to right heart failure or tricuspid regurgitation.  Review of the MIP images confirms the above findings.  IMPRESSION:  1.  No CT findings for pulmonary embolism. 2.  Normal thoracic aorta. 3.  Small amount of pericardial fluid and small bilateral pleural effusions. 4.  Unusual fluid collection at the right apex is likely loculated pleural fluid. 5.  Enlarging supraclavicular, subpectoral, axillary, mediastinal and hilar adenopathy.  PET CT may be helpful for further evaluation. 6.  Mosaic pattern along this is likely due to pulmonary edema. 7.  Stable right lower  lobe pulmonary nodule.   3. 2D echo - 09/05/11 - Left ventricle: The cavity size was normal. There was moderate concentric hypertrophy. Systolic function was moderately reduced. The estimated ejection fraction was in the range of 35% to 40%. Moderate diffuse hypokinesis. Possible hypokinesis of the basal-midanteroseptal myocardium. - Mitral  valve: Mild regurgitation. - Left atrium: The atrium was moderately to severely dilated. - Pulmonary arteries: Systolic pressure was mildly increased. PA peak pressure: 39mm Hg (S).  EKG: V paced 81 bpm  Physical Exam: Blood pressure 148/78, pulse 86, temperature 98.3 F (36.8 C), temperature source Rectal, resp. rate 22, SpO2 92.00%. General: Well developed, well nourished, in no acute distress. Head: Normocephalic, atraumatic, sclera non-icteric, no xanthomas, nares are without discharge.  Neck: JVD not elevated. No obvious lymphadenopathy. Lungs: Decreased BS at bases and generally throughout, with quite end expiratory wheeze. No rales or rhonchi.  Heart: RRR with S1 S2. No murmurs, rubs, or gallops appreciated. Abdomen: Soft, non-tender, non-distended with normoactive bowel sounds. No hepatomegaly. No rebound/guarding. No obvious abdominal masses. Msk:  Strength and tone appear normal for age. Extremities: No clubbing or cyanosis. No edema.  Distal pedal pulses are 2+ and equal bilaterally. Neuro: Alert and oriented X 3. Moves all extremities spontaneously. No focal deficit. Psych:  Responds to questions appropriately with a normal affect.    ASSESSMENT AND PLAN:  1. 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 Pt reports weight gain/DOE, BNP is elevated and CT scan demonstrates evidence of pulmonary edema/pleural effusion. (Of note, he has a hx of pleural effusion s/p decortication.) Unclear precipitant. Will discuss plan for diuresis with MD. He should be continued on his ARB given his EF.  2. Chest  lymphadenopathy on CT scan - the pt endorses some fluctuation in his weight. Would consider having pulmonary evaluate the patient tomorrow to determine if PET CT is the next best step as recommended by today's scan.  3. S/p recent AV node ablation and BiV ppm implantation - May need to have EP round on patient to optimize pacer. Will check 2D echo to r/o significant effusion.  4. Atrial fibrillation - continue Coumadin for now. Interrogation preliminarily does not show any tachyarrythmias.  5. Sleep disturbance & HA - this does not sound like it is related to his cardiac issues. Will d/c Ambien at this time.  Signed, Ronie Spies PA-C 09/20/2011, 7:35 PM  Patient seen and examined. I agree with the assessment and plan as detailed above. See also my additional thoughts below.   It appears that there is some volume overload. The patient seems to watch his salt carefully. He does drink some extra fluids and I have encouraged him to be careful and limit the size of his glasses of liquid when he drinks him. I explained to him that we will have to find the right combination of fluid intake and diuretic dosing. It is possible that he still needs further adjustments of his biventricular pacemaker. His pacer was interrogated this evening. There is no documentation of any ventricular tachyarrhythmias. The pacemaker appears to be working well. He has just had his unit placed. There is not enough background information yet to assess his filling pressures by his pacemaker. We will give him diuretics this evening and arrange for Dr. Ladona Ridgel in the EP team to assess him further tomorrow    The finding of increasing nodes on his CT scan is worrisome. This will need to be evaluated further.     The patient has had some disorientation at night time. I feel that this is not related to his cardiac status. He does use sleeping medications. He says that if he has plans for the following day he does not use the medicine.  However if he does not have  plans he says that his wife may give him "2 pills. It appears to me that there needs to be a better control of his sleeping medications.    Willa Rough, MD, [facility] 09/20/2011 7:43 PM

## 2011-09-20 NOTE — ED Provider Notes (Signed)
History     CSN: [id number] Arrival date & time: 09/20/2011 12:46 PM   First MD Initiated Contact with Patient 09/20/11 1311      Chief Complaint  Patient presents with  . Shortness of Breath    (Consider location/radiation/quality/duration/timing/severity/associated sxs/prior treatment) HPI Complains of shortness of breath gradual onset 3 days ago. Slight cough per wife. Patient had pacemaker inserted 4 days ago.  treatment with inhaler prior to coming here, without relief. No pain anywhere no fever no other complaint no other associated sypmtoms Past Medical History  Diagnosis Date  . HTN (hypertension)     at some point hard to control- saw Dr Lowell Guitar  . HLD (hyperlipidemia)   . DM (diabetes mellitus) 1999    adult onset  . BPH (benign prostatic hyperplasia)     saw Dr [name] 2004, normal renal u/s  . Leg ulcer 2011  . Chronic diastolic heart failure     Echocardiogram 12/25/10: EF 45-50%; moderate LVH; global hypokinesis; moderate biatrial enlargement; PASP 36;    Cardiac catheterization July 2008: No CAD; moderate pulmonary hypertension  . Pleural effusion 2008    Status post decortication  . Insomnia   . Permanent atrial fibrillation     Dr. Ladona Ridgel  . Headache   . Sinoatrial node dysfunction    atrial fibrillalation  Past Surgical History  Procedure Date  . Pleural scarification   . Lung decortication   . Cardiac catheterization   . Colonoscopy 03/10/11    normal  . Pneumothorax with fibrothorax     Family History  Problem Relation Age of Onset  . Diabetes Father   . Breast cancer Maternal Aunt   . Heart attack Neg Hx   . Prostate cancer Neg Hx   . Colon cancer Neg Hx   . Cancer Neg Hx     colon or prostate    History  Substance Use Topics  . Smoking status: Former Smoker -- 2.5 packs/day for 25 years    Types: Cigarettes    Quit date: 10/11/1980  . Smokeless tobacco: Never Used  . Alcohol Use: Yes     occassionally once a year      Review  of Systems  Constitutional: Negative.   HENT: Negative.   Respiratory: Positive for shortness of breath.   Cardiovascular: Negative.   Gastrointestinal: Negative.   Musculoskeletal: Negative.   Skin: Negative.   Neurological: Negative.   Hematological: Negative.   Psychiatric/Behavioral: Negative.     Allergies  Avelox and Tadalafil  Home Medications   Current Outpatient Rx  Name Route Sig Dispense Refill  . ALBUTEROL SULFATE HFA 108 (90 BASE) MCG/ACT IN AERS Inhalation Inhale 2 puffs into the lungs every 6 (six) hours as needed for wheezing. 1 Inhaler 0  . ASPIRIN 81 MG PO TABS Oral Take 1 tablet (81 mg total) by mouth daily.    Marland Kitchen CLOBETASOL PROPIONATE 0.05 % EX CREA Topical Apply topically as directed.      Marland Kitchen DM-GUAIFENESIN ER 30-600 MG PO TB12 Oral Take 1 tablet by mouth as needed. For congestion    . FAMOTIDINE 20 MG PO TABS Oral Take 1 tablet (20 mg total) by mouth at bedtime. This medicine is a similar kind of medicine as Protonix. Please contact the doctor that prescribed this to make sure they want you to take both.    Marland Kitchen FLUTICASONE PROPIONATE 50 MCG/ACT NA SUSP Nasal Place 1 spray into the nose daily.      . FUROSEMIDE 40 MG  PO TABS Oral Take 1.5 tablets (60 mg total) by mouth daily.    . GLYBURIDE 5 MG PO TABS Oral Take 10 mg by mouth 2 (two) times daily with a meal.      . HYDROCODONE-ACETAMINOPHEN 5-325 MG PO TABS Oral Take 1 tablet by mouth every 4 (four) hours as needed. For pain    . LOSARTAN POTASSIUM 100 MG PO TABS Oral Take 1 tablet (100 mg total) by mouth daily. 90 tablet 3  . METOPROLOL TARTRATE 100 MG PO TABS Oral Take 1 tablet (100 mg total) by mouth 2 (two) times daily. 60 tablet 6  . ONE-DAILY MULTI VITAMINS PO TABS Oral Take 1 tablet by mouth daily.      Marland Kitchen NIACIN-LOVASTATIN 1000-20 MG PO TB24 Oral Take 1 tablet by mouth at bedtime.      Marland Kitchen PANTOPRAZOLE SODIUM 40 MG PO TBEC Oral Take 40 mg by mouth every morning.      Marland Kitchen SITAGLIPTIN PHOSPHATE 100 MG PO TABS  Oral Take 1 tablet (100 mg total) by mouth daily. 90 tablet 3  . WARFARIN SODIUM 5 MG PO TABS Oral Take 5 mg by mouth at bedtime.     Marland Kitchen ZOLPIDEM TARTRATE 10 MG PO TABS Oral Take 1 tablet (10 mg total) by mouth at bedtime. 30 tablet 0    BP 151/85  Pulse 87  Temp(Src) 97.8 F (36.6 C) (Oral)  Resp 32  SpO2 92%  Physical Exam  Nursing note and vitals reviewed. Constitutional: He appears well-developed and well-nourished.  HENT:  Head: Normocephalic and atraumatic.  Eyes: Conjunctivae are normal. Pupils are equal, round, and reactive to light.  Neck: Neck supple. No tracheal deviation present. No thyromegaly present.  Cardiovascular: Normal rate and regular rhythm.   No murmur heard. Pulmonary/Chest: Effort normal and breath sounds normal. No respiratory distress. He has no wheezes. He has no rales. He exhibits no tenderness.       No respiratory distress  Abdominal: Soft. Bowel sounds are normal. He exhibits no distension. There is no tenderness.  Musculoskeletal: Normal range of motion. He exhibits no edema and no tenderness.  Neurological: He is alert. Coordination normal.  Skin: Skin is warm and dry. No rash noted.  Psychiatric: He has a normal mood and affect.    Date: 09/20/2011  Rate: 80  Rhythm: Electronically paced  QRS Axis: left  Intervals: normal  ST/T Wave abnormalities: normal  Conduction Disutrbances:nonspecific intraventricular conduction delay  Narrative Interpretation:   Old EKG Reviewed: changes noted last tracing from 09/05/2011 consistent with atrial fibrillation  ED Course  Procedures (including critical care time)   Labs Reviewed  TROPONIN I  CBC  BASIC METABOLIC PANEL  PRO B NATRIURETIC PEPTIDE  D-DIMER, QUANTITATIVE   No results found.   No diagnosis found.  Results for orders placed during the hospital encounter of 09/20/11  TROPONIN I      Component Value Range   Troponin I <0.30  <0.30 (ng/mL)  CBC      Component Value Range   WBC  8.5  4.0 - 10.5 (K/uL)   RBC 4.06 (*) 4.22 - 5.81 (MIL/uL)   Hemoglobin 12.6 (*) 13.0 - 17.0 (g/dL)   HCT 40.9 (*) 81.1 - 52.0 (%)   MCV 90.4  78.0 - 100.0 (fL)   MCH 31.0  26.0 - 34.0 (pg)   MCHC 34.3  30.0 - 36.0 (g/dL)   RDW 91.4  78.2 - 95.6 (%)   Platelets 249  150 - 400 (K/uL)  BASIC METABOLIC PANEL      Component Value Range   Sodium 140  135 - 145 (mEq/L)   Potassium 4.1  3.5 - 5.1 (mEq/L)   Chloride 103  96 - 112 (mEq/L)   CO2 29  19 - 32 (mEq/L)   Glucose, Bld 134 (*) 70 - 99 (mg/dL)   BUN 12  6 - 23 (mg/dL)   Creatinine, Ser 1.19  0.50 - 1.35 (mg/dL)   Calcium 8.8  8.4 - 14.7 (mg/dL)   GFR calc non Af Amer 88 (*) >90 (mL/min)   GFR calc Af Amer >90  >90 (mL/min)  PRO B NATRIURETIC PEPTIDE      Component Value Range   Pro B Natriuretic peptide (BNP) 3004.0 (*) 0 - 125 (pg/mL)  D-DIMER, QUANTITATIVE      Component Value Range   D-Dimer, Quant 0.73 (*) 0.00 - 0.48 (ug/mL-FEU)  PROTIME-INR      Component Value Range   Prothrombin Time 24.9 (*) 11.6 - 15.2 (seconds)   INR 2.21 (*) 0.00 - 1.49    Dg Chest 2 View  09/20/2011  *RADIOLOGY REPORT*  Clinical Data: Shortness of breath  CHEST - 2 VIEW  Comparison: 09/09/2011  Findings: Cardiomegaly again noted.  Dual lead cardiac pacemaker is unchanged in position.  Small bilateral pleural effusion.  Hazy bilateral lower lobe atelectasis or infiltrate.  No convincing pulmonary edema.  IMPRESSION: Cardiomegaly.  Small bilateral pleural effusion.  Hazy bilateral lower lobe atelectasis or infiltrate.  No convincing pulmonary edema.  Original Report Authenticated By: Natasha Mead, M.D.   Dg Chest 2 View  09/09/2011  *RADIOLOGY REPORT*  Clinical Data: Placement of permanent pacemaker  CHEST - 2 VIEW  Comparison: Chest x-ray of 09/05/2011  Findings: A dual lead permanent pacemaker is now present.  Blunting of the right costophrenic angle is stable most consistent with pleural thickening.  No focal infiltrate or effusion is seen and no  pneumothorax is noted.  There are degenerative changes throughout the thoracic spine.  Cardiomegaly is stable.  IMPRESSION: Dual lead permanent pacemaker now present.  Stable mild cardiomegaly.  No pneumothorax.  Original Report Authenticated By: Juline Patch, M.D.   Dg Chest 2 View  09/05/2011  *RADIOLOGY REPORT*  Clinical Data: 70 year old male with shortness of breath and CHF exacerbation.  CHEST - 2 VIEW  Comparison: 09/04/2011 and prior chest radiographs  Findings: Cardiomegaly, pulmonary vascular congestion and mild interstitial pulmonary edema are again noted. A very small right pleural effusion is unchanged. There is no evidence of pneumothorax. Little interval change is noted since the prior study.  IMPRESSION: Stable chest radiograph with cardiomegaly, interstitial pulmonary edema and very small right pleural effusion.  Original Report Authenticated By: Rosendo Gros, M.D.   Dg Chest 2 View  08/24/2011  *RADIOLOGY REPORT*  Clinical Data: Cough.  Shortness of breath and congestion  CHEST - 2 VIEW  Comparison: 08/16/2011  Findings: The heart size appears moderately enlarged.  There is no pleural effusion identified. There has been interval resolution of previously noted interstitial edema.  Diffuse, coarsened interstitial markings are noted bilaterally consistent with chronic interstitial lung disease.  No focal areas of airspace consolidation noted.  IMPRESSION:  1.  Chronic interstitial lung disease, similar to previous examinations. 2.  Resolution of edema.  Original Report Authenticated By: Rosealee Albee, M.D.   Ct Angio Chest W/cm &/or Wo Cm  09/20/2011  *RADIOLOGY REPORT*  Clinical Data:  Shortness of breath.  CT ANGIOGRAPHY CHEST WITH CONTRAST  Technique:  Multidetector CT imaging of the chest was performed using the standard protocol during bolus administration of intravenous contrast.  Multiplanar CT image reconstructions including MIPs were obtained to evaluate the vascular anatomy.   Contrast: 80mL OMNIPAQUE IOHEXOL 350 MG/ML IV SOLN  Comparison:  CT scan 05/25/2011.  Findings:  The chest wall is stable.  The permanent left-sided pacemaker is noted.  There are scattered small supraclavicular, subpectoral and axillary lymph nodes.  These are stable.  The bony thorax is intact and appears stable.  No destructive bony lesions or spinal canal compromise.  The heart is enlarged but stable.  No pericardial effusion. Scattered pericardial fluid collections are noted.  There are enlarged and enlarging mediastinal and hilar lymph nodes.  The aorta is normal in caliber.  Mild atherosclerotic changes.  No dissection.  The pulmonary arterial tree is well opacified.  No filling defects to suggest pulmonary emboli.  Coronary artery calcifications are noted.  The esophagus is grossly normal.  Examination of the lung parenchyma demonstrates a lobulated right apical mass.  This may be loculated pleural fluid at the apex. There was not present on the prior study.  Some fluid is also noted in the left major fissure.  There is a mosaic pattern density in the lungs.  I believe this is most likely asymmetric pulmonary edema.  There are small bilateral pleural effusions.  No worrisome pulmonary mass lesions.  The right lower lobe nodule is not well demonstrated and appears stable.  The upper abdomen demonstrates no significant findings.  There is reflux of contrast down the IVC which may be due to right heart failure or tricuspid regurgitation.  Review of the MIP images confirms the above findings.  IMPRESSION:  1.  No CT findings for pulmonary embolism. 2.  Normal thoracic aorta. 3.  Small amount of pericardial fluid and small bilateral pleural effusions. 4.  Unusual fluid collection at the right apex is likely loculated pleural fluid. 5.  Enlarging supraclavicular, subpectoral, axillary, mediastinal and hilar adenopathy.  PET CT may be helpful for further evaluation. 6.  Mosaic pattern along this is likely due to  pulmonary edema. 7.  Stable right lower lobe pulmonary nodule.  Original Report Authenticated By: P. Loralie Champagne, M.D.   Dg Chest Portable 1 View  09/04/2011  *RADIOLOGY REPORT*  Clinical Data: Chest pain, shortness of breath  PORTABLE CHEST - 1 VIEW  Comparison: 08/24/2011  Findings: Interval increase in mild diffuse interstitial edema. Patchy airspace opacities at the lung bases, left greater than right, slightly increased.  Suspect small right pleural effusion. Mild cardiomegaly stable.  Atheromatous aortic arch.  IMPRESSION: 1.  Interval increase in bilateral edema or infiltrates. 2.  Suspect small right pleural effusion.  Original Report Authenticated By: Thora Lance III, M.D.     MDM  In light of oxygen requirement patient requires admission and further treatment of CHF. Spoke with Dr.Nahser who accepts patient in transfer Diagnosis congestive heart failure        Doug Sou, MD 09/20/11 [phone]

## 2011-09-20 NOTE — ED Notes (Signed)
Called [phone] for report secretary states receiving nurse is in doing an iv and will be 15 minutes before able to take report. Advised secretary that [facility] is present and loading pt up if unable to give report hazel rn with [facility] will  Give the nurse to nurse report enroute or at bedside. Secretary states that is fine she will notify receiving nurse of the above

## 2011-09-21 DIAGNOSIS — I509 Heart failure, unspecified: Secondary | ICD-10-CM

## 2011-09-21 DIAGNOSIS — I4891 Unspecified atrial fibrillation: Secondary | ICD-10-CM

## 2011-09-21 LAB — BASIC METABOLIC PANEL
BUN: 13 mg/dL (ref 6–23)
CO2: 32 mEq/L (ref 19–32)
Calcium: 9 mg/dL (ref 8.4–10.5)
Chloride: 102 mEq/L (ref 96–112)
Creatinine, Ser: 0.67 mg/dL (ref 0.50–1.35)
GFR calc Af Amer: >90 mL/min (ref >90)
GFR calc non Af Amer: >90 mL/min (ref >90)
Glucose, Bld: 103 mg/dL — ABNORMAL HIGH (ref 70–99)
Potassium: 3.4 mEq/L — ABNORMAL LOW (ref 3.5–5.1)
Sodium: 142 mEq/L (ref 135–145)

## 2011-09-21 LAB — GLUCOSE, CAPILLARY
Glucose-Capillary: 140 mg/dL — ABNORMAL HIGH (ref 70–99)
Glucose-Capillary: 164 mg/dL — ABNORMAL HIGH (ref 70–99)
Glucose-Capillary: 103 mg/dL — ABNORMAL HIGH (ref 70–99)
Glucose-Capillary: 206 mg/dL — ABNORMAL HIGH (ref 70–99)

## 2011-09-21 LAB — PROTIME-INR
INR: 1.91 — ABNORMAL HIGH (ref 0.00–1.49)
Prothrombin Time: 22.2 seconds — ABNORMAL HIGH (ref 11.6–15.2)

## 2011-09-21 MED ORDER — FUROSEMIDE 10 MG/ML IJ SOLN
80.0000 mg | Freq: Two times a day (BID) | INTRAMUSCULAR | Status: DC
  Administered 2011-09-21 – 2011-09-22 (×2): 80 mg via INTRAVENOUS
  Filled 2011-09-21 (×2): qty 8

## 2011-09-21 MED ORDER — FUROSEMIDE 10 MG/ML IJ SOLN
80.0000 mg | Freq: Once | INTRAMUSCULAR | Status: AC
  Administered 2011-09-21: 80 mg via INTRAVENOUS

## 2011-09-21 NOTE — Progress Notes (Signed)
ANTICOAGULATION CONSULT NOTE - Follow Up Consult  Pharmacy Consult for coumadin  Indication: atrial fibrillation  Allergies  Allergen Reactions  . Avelox (Moxifloxacin Hydrochloride) Itching    Headache  . Tadalafil     REACTION: HA, backache    Patient Measurements: Height: 5\' 8"  (172.7 cm) Weight: 204 lb 5.9 oz (92.7 kg) IBW/kg (Calculated) : 68.4   Labs:  Basename 09/21/11 0540 09/20/11 1332  HGB -- 12.6*  HCT -- 36.7*  PLT -- 249  APTT -- --  LABPROT 22.2* 24.9*  INR 1.91* 2.21*  HEPARINUNFRC -- --  CREATININE 0.67 0.80  CKTOTAL -- --  CKMB -- --  TROPONINI -- <0.30    Assessment: 70 yo man on coumadin for afib. INR slightly subtherapeutic today. Home dose 5mg  daily at bed time. Pt. Didn't receive a dose yesterday in the hospital. Likely missed a dose. Currently resumed on home dose 5mg  daily from this PM. INR should back in therapeutic range tomorrow  Goal of Therapy:  INR 2-3   Plan:  Continue coumadin 5mg  daily We will f/u INR trend daily and adjust dose if needed.  Riki Rusk 09/21/2011,2:04 PM

## 2011-09-21 NOTE — Progress Notes (Signed)
SUBJECTIVE: The patient is doing well today.  He remains dyspneic but feels that he is improving.  At this time, he denies chest pain, or any new concerns.     Marland Kitchen aspirin  81 mg Oral Daily  . fluticasone  2 spray Each Nare Daily  . furosemide  80 mg Intravenous Once  . furosemide  80 mg Intravenous Once  . furosemide  80 mg Intravenous Once  . glyBURIDE  10 mg Oral BID WC  . insulin aspart  0-9 Units Subcutaneous TID WC  . linagliptin  5 mg Oral Daily  . losartan  100 mg Oral Daily  . metoprolol  100 mg Oral BID  . multivitamins ther. w/minerals  1 tablet Oral Daily  . niacin  1,000 mg Oral QHS  . nitroGLYCERIN  1 inch Topical Once  . pantoprazole  40 mg Oral Q0700  . simvastatin  10 mg Oral QHS  . sodium chloride  3 mL Intravenous Q12H  . warfarin  5 mg Oral q1800  . DISCONTD: furosemide  80 mg Intramuscular Once  . DISCONTD: niacin-lovastatin  1 tablet Oral QHS      OBJECTIVE: Physical Exam: Filed Vitals:   09/20/11 2202 09/21/11 0100 09/21/11 0600 09/21/11 1300  BP: 181/97 150/90 171/91 162/89  Pulse: 82  74 84  Temp: 98.7 F (37.1 C)  97.9 F (36.6 C) 98.2 F (36.8 C)  TempSrc: Oral  Oral   Resp: 23  24 18   Height:      Weight:   204 lb 5.9 oz (92.7 kg)   SpO2: 93%  97% 96%    Intake/Output Summary (Last 24 hours) at 09/21/11 1427 Last data filed at 09/21/11 1100  Gross per 24 hour  Intake    240 ml  Output   4325 ml  Net  -4085 ml    Telemetry reveals afib with V pacing  GEN- The patient is well appearing, alert and oriented x 3 today.   Head- normocephalic, atraumatic Eyes-  Sclera clear, conjunctiva pink Ears- hearing intact Oropharynx- clear Neck- supple, JVP 10cm Lymph- no cervical lymphadenopathy Lungs- few basilar rales Heart- Regular rate and rhythm  (paced) GI- soft, NT, ND, + BS Extremities- no clubbing, cyanosis, +1 edema  BiV pacemaker interrogation reviewed,  96% BiV paced,  Simultaneous RV/LV timing Rate response adjusted due to flat  histograms  EKG reveals A fib with V pacing (QRS )  LABS: Basic Metabolic Panel:  Basename 09/21/11 0540 09/20/11 1332  NA 142 140  K 3.4* 4.1  CL 102 103  CO2 32 29  GLUCOSE 103* 134*  BUN 13 12  CREATININE 0.67 0.80  CALCIUM 9.0 8.8  MG -- --  PHOS -- --   Liver Function Tests: No results found for this basename: AST:2,ALT:2,ALKPHOS:2,BILITOT:2,PROT:2,ALBUMIN:2 in the last 72 hours No results found for this basename: LIPASE:2,AMYLASE:2 in the last 72 hours CBC:  Basename 09/20/11 1332  WBC 8.5  NEUTROABS --  HGB 12.6*  HCT 36.7*  MCV 90.4  PLT 249   Cardiac Enzymes:  Basename 09/20/11 1332  CKTOTAL --  CKMB --  CKMBINDEX --  TROPONINI <0.30   ASSESSMENT AND PLAN:    ASSESSMENT AND PLAN:   1. 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 Continue gentle diuresis,  BiV interrogation reviewed and rate response adjusted,  No other changes made  Continue gentle diuresis/ medical management for CHF He should be continued on his ARB given his  EF.   2. Chest lymphadenopathy on CT scan - the pt endorses some fluctuation in his weight. Would consider having pulmonary evaluate the patient to determine if PET CT is the next best step as recommended by  CT scan.   3. S/p recent AV node ablation and BiV ppm implantation -  Interrogation reviewed  4. Atrial fibrillation - continue Coumadin (goal INR 2-3)   Hillis Range, MD 09/21/2011 2:27 PM

## 2011-09-21 NOTE — Progress Notes (Signed)
   HEART FAILURE CARE MANAGEMENT NOTE 09/21/2011  Patient:  Bradley [name], Bradley [name]   Account Number:  [id number]  Date Initiated:  09/21/2011  Documentation initiated by:  Shannan Harper  Subjective/Objective Assessment:   Patient admitted with worsening SOB, recently d/c for pacemaker insertion.  Rate changed in MD office on 09/16/11 and patient has not felt well since that time.     Action/Plan:   Lives at home with wife, originally from [place]. Anticipate d/c with HH for CHF mgmt.   Anticipated DC Date:     Anticipated DC Plan:  HOME W HOME HEALTH SERVICES      DC Planning Services  CM consult      Choice offered to / List presented to:             Status of service:  In process, will continue to follow Medicare Important Message given?  NA - LOS <3 / Initial given by admissions (If response is "NO", the following Medicare IM given date fields will be blank) Date Medicare IM given:   Date Additional Medicare IM given:    Discharge Disposition:    Per UR Regulation:  Reviewed for med. necessity/level of care/duration of stay  Comments:  Heart Failure Patient:  UR Completed.  Will meet with patient today to determine needs at discharge. 09/21/11 1015 Shannan Harper, RN, BSN

## 2011-09-22 DIAGNOSIS — I369 Nonrheumatic tricuspid valve disorder, unspecified: Secondary | ICD-10-CM

## 2011-09-22 LAB — BASIC METABOLIC PANEL
BUN: 14 mg/dL (ref 6–23)
BUN: 13 mg/dL (ref 6–23)
CO2: 35 mEq/L — ABNORMAL HIGH (ref 19–32)
CO2: 33 mEq/L — ABNORMAL HIGH (ref 19–32)
Calcium: 9.4 mg/dL (ref 8.4–10.5)
Calcium: 9.2 mg/dL (ref 8.4–10.5)
Chloride: 102 mEq/L (ref 96–112)
Chloride: 98 mEq/L (ref 96–112)
Creatinine, Ser: 0.76 mg/dL (ref 0.50–1.35)
Creatinine, Ser: 0.7 mg/dL (ref 0.50–1.35)
GFR calc Af Amer: >90 mL/min (ref >90)
GFR calc Af Amer: >90 mL/min (ref >90)
GFR calc non Af Amer: >90 mL/min (ref >90)
GFR calc non Af Amer: >90 mL/min (ref >90)
Glucose, Bld: 79 mg/dL (ref 70–99)
Glucose, Bld: 167 mg/dL — ABNORMAL HIGH (ref 70–99)
Potassium: 4.2 mEq/L (ref 3.5–5.1)
Potassium: 4.3 mEq/L (ref 3.5–5.1)
Sodium: 147 mEq/L — ABNORMAL HIGH (ref 135–145)
Sodium: 140 mEq/L (ref 135–145)

## 2011-09-22 LAB — GLUCOSE, CAPILLARY
Glucose-Capillary: 120 mg/dL — ABNORMAL HIGH (ref 70–99)
Glucose-Capillary: 144 mg/dL — ABNORMAL HIGH (ref 70–99)
Glucose-Capillary: 166 mg/dL — ABNORMAL HIGH (ref 70–99)
Glucose-Capillary: 179 mg/dL — ABNORMAL HIGH (ref 70–99)

## 2011-09-22 LAB — PROTIME-INR
INR: 1.72 — ABNORMAL HIGH (ref 0.00–1.49)
Prothrombin Time: 20.5 seconds — ABNORMAL HIGH (ref 11.6–15.2)

## 2011-09-22 MED ORDER — FUROSEMIDE 10 MG/ML IJ SOLN
80.0000 mg | Freq: Two times a day (BID) | INTRAMUSCULAR | Status: DC
  Administered 2011-09-22: 80 mg via INTRAVENOUS

## 2011-09-22 MED ORDER — POTASSIUM CHLORIDE CRYS ER 20 MEQ PO TBCR
40.0000 meq | EXTENDED_RELEASE_TABLET | Freq: Once | ORAL | Status: AC
  Administered 2011-09-22: 40 meq via ORAL
  Filled 2011-09-22: qty 2

## 2011-09-22 NOTE — Progress Notes (Signed)
   Heart Failure CARE MANAGEMENT NOTE 09/22/2011  Patient:  Bradley [name], Bradley [name]   Account Number:  [id number]  Date Initiated:  09/21/2011  Documentation initiated by:  Shannan Harper  Subjective/Objective Assessment:   Patient admitted with worsening SOB, recently d/c for pacemaker insertion.  Rate changed in MD office on 09/16/11 and patient has not felt well since that time.     Action/Plan:   Lives at home with wife, originally from [place]. Anticipate d/c with HH for CHF mgmt.   Patient refused HH at this time.   Anticipated DC Date:  09/23/2011   Anticipated DC Plan:  HOME/SELF CARE      DC Planning Services  CM consult      Choice offered to / List presented to:             Status of service:  Completed, signed off Medicare Important Message given?  NA - LOS <3 / Initial given by admissions (If response is "NO", the following Medicare IM given date fields will be blank) Date Medicare IM given:   Date Additional Medicare IM given:    Discharge Disposition:  HOME/SELF CARE  Per UR Regulation:  Reviewed for med. necessity/level of care/duration of stay  Comments:  Heart Failure Patient:  Met with patient and he is well supported at home by wife. He is ambulatory and able to care adequately for himself. He feels he is fine to return home without additional support measures.  Discussed salt intake, daily weights, he has a scale at home, zones, and he did have a heart failure packet at the bedside.  Will continue to assist.  09/22/11 1625 Shannan Harper RN, BSN  UR Completed.  Will meet with patient today to determine needs at discharge. 09/21/11 1015 Shannan Harper, RN, BSN

## 2011-09-22 NOTE — Telephone Encounter (Signed)
Patient's wife was called,states patient was admitted to [facility].

## 2011-09-22 NOTE — Progress Notes (Signed)
Patient ID: Bradley [name], male   DOB: 1941-05-02, 70 y.o.   MRN: [id number] Subjective:  Dyspnea improved. No chest pain.  Objective:  Vital Signs in the last 24 hours: Temp:  [98.1 F (36.7 C)-98.8 F (37.1 C)] 98.1 F (36.7 C) (12/12 0509) Pulse Rate:  [82-85] 82  (12/12 0509) Resp:  [18-20] 20  (12/12 0509) BP: (143-163)/(81-98) 163/98 mmHg (12/12 0509) SpO2:  [93 %-97 %] 97 % (12/12 0509) Weight:  [92.1 kg (203 lb 0.7 oz)] 203 lb 0.7 oz (92.1 kg) (12/12 0509)  Intake/Output from previous day: 12/11 0701 - 12/12 0700 In: 480 [P.O.:480] Out: 2625 [Urine:2625] Intake/Output from this shift:    Physical Exam: Well appearing NAD HEENT: Unremarkable Neck:  7 cm JVD, no thyromegally Lungs:  Clear except for scattered basilar rales. Well healed PP  Incision. HEART:  Regular rate rhythm, no murmurs, no rubs, no clicks Abd:  soft, positive bowel sounds, no organomegally, no rebound, no guarding Ext:  2 plus pulses, no edema, no cyanosis, no clubbing Skin:  No rashes no nodules Neuro:  CN II through XII intact, motor grossly intact  Lab Results:  Basename 09/20/11 1332  WBC 8.5  HGB 12.6*  PLT 249    Basename 09/21/11 0540 09/20/11 1332  NA 142 140  K 3.4* 4.1  CL 102 103  CO2 32 29  GLUCOSE 103* 134*  BUN 13 12  CREATININE 0.67 0.80    Basename 09/20/11 1332  TROPONINI <0.30   Hepatic Function Panel No results found for this basename: PROT,ALBUMIN,AST,ALT,ALKPHOS,BILITOT,BILIDIR,IBILI in the last 72 hours No results found for this basename: CHOL in the last 72 hours No results found for this basename: PROTIME in the last 72 hours  Imaging: Dg Chest 2 View  09/20/2011  *RADIOLOGY REPORT*  Clinical Data: Shortness of breath  CHEST - 2 VIEW  Comparison: 09/09/2011  Findings: Cardiomegaly again noted.  Dual lead cardiac pacemaker is unchanged in position.  Small bilateral pleural effusion.  Hazy bilateral lower lobe atelectasis or infiltrate.  No convincing  pulmonary edema.  IMPRESSION: Cardiomegaly.  Small bilateral pleural effusion.  Hazy bilateral lower lobe atelectasis or infiltrate.  No convincing pulmonary edema.  Original Report Authenticated By: Natasha Mead, M.D.   Ct Angio Chest W/cm &/or Wo Cm  09/20/2011  *RADIOLOGY REPORT*  Clinical Data:  Shortness of breath.  CT ANGIOGRAPHY CHEST WITH CONTRAST  Technique:  Multidetector CT imaging of the chest was performed using the standard protocol during bolus administration of intravenous contrast.  Multiplanar CT image reconstructions including MIPs were obtained to evaluate the vascular anatomy.  Contrast: 80mL OMNIPAQUE IOHEXOL 350 MG/ML IV SOLN  Comparison:  CT scan 05/25/2011.  Findings:  The chest wall is stable.  The permanent left-sided pacemaker is noted.  There are scattered small supraclavicular, subpectoral and axillary lymph nodes.  These are stable.  The bony thorax is intact and appears stable.  No destructive bony lesions or spinal canal compromise.  The heart is enlarged but stable.  No pericardial effusion. Scattered pericardial fluid collections are noted.  There are enlarged and enlarging mediastinal and hilar lymph nodes.  The aorta is normal in caliber.  Mild atherosclerotic changes.  No dissection.  The pulmonary arterial tree is well opacified.  No filling defects to suggest pulmonary emboli.  Coronary artery calcifications are noted.  The esophagus is grossly normal.  Examination of the lung parenchyma demonstrates a lobulated right apical mass.  This may be loculated pleural fluid at the apex.  There was not present on the prior study.  Some fluid is also noted in the left major fissure.  There is a mosaic pattern density in the lungs.  I believe this is most likely asymmetric pulmonary edema.  There are small bilateral pleural effusions.  No worrisome pulmonary mass lesions.  The right lower lobe nodule is not well demonstrated and appears stable.  The upper abdomen demonstrates no  significant findings.  There is reflux of contrast down the IVC which may be due to right heart failure or tricuspid regurgitation.  Review of the MIP images confirms the above findings.  IMPRESSION:  1.  No CT findings for pulmonary embolism. 2.  Normal thoracic aorta. 3.  Small amount of pericardial fluid and small bilateral pleural effusions. 4.  Unusual fluid collection at the right apex is likely loculated pleural fluid. 5.  Enlarging supraclavicular, subpectoral, axillary, mediastinal and hilar adenopathy.  PET CT may be helpful for further evaluation. 6.  Mosaic pattern along this is likely due to pulmonary edema. 7.  Stable right lower lobe pulmonary nodule.  Original Report Authenticated By: P. Loralie Champagne, M.D.    Cardiac Studies: Tele - atrial fib with ventricular pacing. Assessment/Plan:  1. Acute/chronic systolic heart failure - he has had a nice diuresis. Will switch over to po lasix after this morning's dose. Expect discharge tomorrow. 2. Atrial fib - his rate is now controlled. 3. Adenopathy on CT scan - he will require PET scan as an outpatient and followup with pulmonary. 4. Hypokalemia - replete. Will repeat labs.  LOS: 2 days    Lewayne Bunting 09/22/2011, 8:03 AM

## 2011-09-22 NOTE — Progress Notes (Signed)
ANTICOAGULATION CONSULT NOTE - Follow Up Consult  Pharmacy Consult for coumadin  Indication: atrial fibrillation  Allergies  Allergen Reactions  . Avelox (Moxifloxacin Hydrochloride) Itching    Headache  . Tadalafil     REACTION: HA, backache    Patient Measurements: Height: 5\' 8"  (172.7 cm) Weight: 203 lb 0.7 oz (92.1 kg) IBW/kg (Calculated) : 68.4   Labs:  Basename 09/22/11 0840 09/22/11 0545 09/21/11 0540 09/20/11 1332  HGB -- -- -- 12.6*  HCT -- -- -- 36.7*  PLT -- -- -- 249  APTT -- -- -- --  LABPROT -- 20.5* 22.2* 24.9*  INR -- 1.72* 1.91* 2.21*  HEPARINUNFRC -- -- -- --  CREATININE 0.70 0.76 0.67 --  CKTOTAL -- -- -- --  CKMB -- -- -- --  TROPONINI -- -- -- <0.30    Assessment: 70 yo man on coumadin for afib. INR decreased today likely due to dose missed 12/10.  Will continue home dose 5mg  daily. Goal of Therapy:  INR 2-3   Plan:  Continue coumadin 5mg  daily We will f/u INR trend daily and adjust dose if needed.  Talbert Cage Poteet 09/22/2011,11:03 AM

## 2011-09-22 NOTE — Progress Notes (Signed)
*  PRELIMINARY RESULTS* Echocardiogram 2D Echocardiogram has been performed.  Bradley [name] [name] 09/22/2011, 1:45 PM

## 2011-09-23 DIAGNOSIS — E876 Hypokalemia: Secondary | ICD-10-CM

## 2011-09-23 DIAGNOSIS — I5023 Acute on chronic systolic (congestive) heart failure: Principal | ICD-10-CM

## 2011-09-23 DIAGNOSIS — R591 Generalized enlarged lymph nodes: Secondary | ICD-10-CM

## 2011-09-23 LAB — BASIC METABOLIC PANEL
BUN: 17 mg/dL (ref 6–23)
CO2: 31 mEq/L (ref 19–32)
Calcium: 9.1 mg/dL (ref 8.4–10.5)
Chloride: 101 mEq/L (ref 96–112)
Creatinine, Ser: 0.73 mg/dL (ref 0.50–1.35)
GFR calc Af Amer: >90 mL/min (ref >90)
GFR calc non Af Amer: >90 mL/min (ref >90)
Glucose, Bld: 84 mg/dL (ref 70–99)
Potassium: 4.1 mEq/L (ref 3.5–5.1)
Sodium: 139 mEq/L (ref 135–145)

## 2011-09-23 LAB — PROTIME-INR
INR: 1.77 — ABNORMAL HIGH (ref 0.00–1.49)
Prothrombin Time: 20.9 seconds — ABNORMAL HIGH (ref 11.6–15.2)

## 2011-09-23 LAB — GLUCOSE, CAPILLARY
Glucose-Capillary: 118 mg/dL — ABNORMAL HIGH (ref 70–99)
Glucose-Capillary: 137 mg/dL — ABNORMAL HIGH (ref 70–99)

## 2011-09-23 MED ORDER — WARFARIN SODIUM 5 MG PO TABS: 5.0000 mg | ORAL_TABLET | Freq: Every day | ORAL | Status: DC

## 2011-09-23 MED ORDER — FUROSEMIDE 40 MG PO TABS: 40.0000 mg | ORAL_TABLET | Freq: Two times a day (BID) | ORAL | Status: DC

## 2011-09-23 NOTE — Progress Notes (Signed)
Pt's resting 02 sat on room air was 94%. Pt ambulated on room air, 02 sat dropped to 87%. While ambulating 02 applied at 2L and pt's sats increased to 96%.

## 2011-09-23 NOTE — Progress Notes (Signed)
ANTICOAGULATION CONSULT NOTE - Follow Up Consult  Pharmacy Consult for coumadin Indication: atrial fibrillation  Allergies  Allergen Reactions  . Avelox (Moxifloxacin Hydrochloride) Itching    Headache  . Tadalafil     REACTION: HA, backache    Patient Measurements: Height: 5\' 8"  (172.7 cm) Weight: 202 lb 9.6 oz (91.9 kg) IBW/kg (Calculated) : 68.4  Adjusted Body Weight:   Vital Signs: Temp: 97.2 F (36.2 C) (12/13 0608) Temp src: Oral (12/13 0608) BP: 145/87 mmHg (12/13 0608) Pulse Rate: 83  (12/13 0608)  Labs:  Basename 09/23/11 0510 09/22/11 0840 09/22/11 0545 09/21/11 0540 09/20/11 1332  HGB -- -- -- -- 12.6*  HCT -- -- -- -- 36.7*  PLT -- -- -- -- 249  APTT -- -- -- -- --  LABPROT 20.9* -- 20.5* 22.2* --  INR 1.77* -- 1.72* 1.91* --  HEPARINUNFRC -- -- -- -- --  CREATININE 0.73 0.70 0.76 -- --  CKTOTAL -- -- -- -- --  CKMB -- -- -- -- --  TROPONINI -- -- -- -- <0.30   Estimated Creatinine Clearance: 94.5 ml/min (by C-G formula based on Cr of 0.73).   Medications:  Scheduled:    . aspirin  81 mg Oral Daily  . fluticasone  2 spray Each Nare Daily  . glyBURIDE  10 mg Oral BID WC  . insulin aspart  0-9 Units Subcutaneous TID WC  . linagliptin  5 mg Oral Daily  . losartan  100 mg Oral Daily  . metoprolol  100 mg Oral BID  . multivitamins ther. w/minerals  1 tablet Oral Daily  . niacin  1,000 mg Oral QHS  . pantoprazole  40 mg Oral Q0700  . simvastatin  10 mg Oral QHS  . sodium chloride  3 mL Intravenous Q12H  . warfarin  5 mg Oral q1800  . DISCONTD: furosemide  80 mg Intravenous BID   Infusions:    Assessment: 70 yo male with afib is currently on subhtherapeutic coumadin.  INR's rising.  Decrease in INR probably due to the fact that he didn't receive coumadin on 12/10.  He was on coumadin 5mg  po qday prior to admission with a good admitting INR. Goal of Therapy:  INR 2-3   Plan:  1) Cont coumadin 5mg  po qday; INR in am  Allen Basista,  Tsz-Yin 09/23/2011,12:53 PM

## 2011-09-23 NOTE — Discharge Summary (Signed)
Discharge Summary   Patient ID: Bradley [name],  MRN: [id number], DOB/AGE: 1941/09/22 70 y.o.  Admit date: 09/20/2011 Discharge date: 09/23/2011  Discharge Diagnoses Principal Problem:  *Systolic CHF, acute on chronic Active Problems:  Permanent atrial fibrillation  Hypokalemia  Lymphadenopathy   Allergies Allergies  Allergen Reactions  . Avelox (Moxifloxacin Hydrochloride) Itching    Headache  . Tadalafil     REACTION: HA, backache    Procedures  CT angiography chest with contrast Findings: The chest wall is stable. The permanent left-sided pacemaker is noted. There are scattered small supraclavicular, subpectoral and axillary lymph nodes. These are stable. The bony thorax is intact and appears stable. No destructive bony lesions or spinal canal compromise.  The heart is enlarged but stable. No pericardial effusion. Scattered pericardial fluid collections are noted. There are enlarged and enlarging mediastinal and hilar lymph nodes.  The aorta is normal in caliber. Mild atherosclerotic changes. No dissection.  The pulmonary arterial tree is well opacified. No filling defects to suggest pulmonary emboli. Coronary artery calcifications are noted. The esophagus is grossly normal.  Examination of the lung parenchyma demonstrates a lobulated right apical mass. This may be loculated pleural fluid at the apex. There was not present on the prior study. Some fluid is also noted in the left major fissure. There is a mosaic pattern density in the lungs. I believe this is most likely asymmetric pulmonary edema. There are small bilateral pleural effusions. No worrisome pulmonary mass lesions. The right lower lobe nodule is not well demonstrated and appears stable.  The upper abdomen demonstrates no significant findings. There is reflux of contrast down the IVC which may be due to right heart failure or tricuspid regurgitation.  Review of the MIP images confirms the above findings.    IMPRESSION:  1. No CT findings for pulmonary embolism.  2. Normal thoracic aorta.  3. Small amount of pericardial fluid and small bilateral pleural  effusions.  4. Unusual fluid collection at the right apex is likely loculated  pleural fluid.  5. Enlarging supraclavicular, subpectoral, axillary, mediastinal  and hilar adenopathy. PET CT may be helpful for further  evaluation.  6. Mosaic pattern along this is likely due to pulmonary edema.  7. Stable right lower lobe pulmonary nodule.  2D echocardiogram with contrast Study Conclusions  - Left ventricle: The cavity size was normal. Wall thickness was increased in a pattern of moderate LVH. Systolic function was mildly to moderately reduced. The estimated ejection fraction was in the range of 40% to 45%. Diffuse hypokinesis. Regional wall motion abnormalities cannot be excluded. - Mitral valve: Calcified annulus. - Left atrium: The atrium was moderately dilated. - Right ventricle: The cavity size was mildly dilated. - Right atrium: The atrium was moderately dilated. - Pulmonary arteries: Systolic pressure was mildly increased. PA peak pressure: 37mm Hg (S). ------------------------------------------------------------ Labs, prior tests, procedures, and surgery: Permanent pacemaker system implantation.  Transthoracic echocardiography. M-mode, complete 2D, spectral Doppler, and color Doppler. Blood pressure: 163/98. Patient status: Inpatient. Location: Echo laboratory. ------------------------------------------------------------ Left ventricle: The cavity size was normal. Wall thickness was increased in a pattern of moderate LVH. Systolic function was mildly to moderately reduced. The estimated ejection fraction was in the range of 40% to 45%. Diffuse hypokinesis. Regional wall motion abnormalities cannot be excluded. ------------------------------------------------------------ Aortic valve: Trileaflet; mildly thickened leaflets.  Mobility was not restricted. Doppler: Transvalvular velocity was within the normal range. There was no stenosis. No regurgitation. ------------------------------------------------------------ Aorta: Aortic root: The aortic root was normal in size. ------------------------------------------------------------  Mitral valve: Calcified annulus. Mobility was not restricted. Doppler: Transvalvular velocity was within the normal range. There was no evidence for stenosis. Trivial regurgitation. Peak gradient: 4mm Hg (D). ------------------------------------------------------------ Left atrium: The atrium was moderately dilated.  ------------------------------------------------------------ Right ventricle: Poorly visualized. The cavity size was mildly dilated. Pacer wire or catheter noted in right ventricle. ------------------------------------------------------------ Pulmonic valve: Doppler: Transvalvular velocity was within the normal range. There was no evidence for stenosis. ------------------------------------------------------------ Tricuspid valve: Doppler: Transvalvular velocity was within the normal range. Mild regurgitation. ------------------------------------------------------------ Pulmonary artery: Systolic pressure was mildly increased. ------------------------------------------------------------ Right atrium: The atrium was moderately dilated. Pacer wire or catheter noted in right atrium. ------------------------------------------------------------ Pericardium: There was no pericardial effusion. ----------------------------------------------------------- Systemic veins: Inferior vena cava: The vessel was normal in size.  History of Present Illness  Bradley [name] is a 70 yo Caucasian male with past medical history significant for chronic systolic CHF (secondary to NICM, most recent EF 35-40%), chronic atrial fibrillation (s/p AV node ablation, B-IV ICD), COPD, HTN, HL and type 2  diabetes mellitus who is admitted to [facility] with acute on chronic systolic CHF.  He reported on 12/6, his pacer was checked and changed to 80, and has not felt right ever since. He endorsed insomnia with headaches upon attempting to fall asleep with associated sleeping episodes throughout the day.  Additionally, in the days prior to his admission, he endorsed increasing shortness of breath/DOE when climbing stairs. He denied chest pain, syncope or palpitations. He also endorsed a 5 pound weight gain since recent hospital discharge on 11/29.  Hospital Course:  He presented to [facility] with these symptoms and was transferred to [facility] ED. While there d-dimer was found be mildly elevated, so CTA was done showing small amount of pericardial fluid, small bilateral pleural effusions, usual fluid collection at right apex slightly loculated pleural fluid, mosaic pattern likely due to pulmonary edema. There is no evidence of PE however a stable right lower lobe pulmonary nodule enlarging supraclavicular, subpectoral, axillary, mediastinal and hilar adenopathy was visualized. Recommendation was made for further evaluation with PET CT. Incidentally during his previous hospitalization he was also noted to have recent weight loss & elevated sed rate, but also states his weight has since fluctuated and gone back up. BNP was elevated. Point-of-care troponin I was negative.  He was subsequently admitted with acute on chronic systolic CHF. Lasix IV was started, Coumadin and ARB continued and 2-D echo was ordered to rule out significant pericardial effusion. This study elicited the above results most notably LVEF 40-45%, moderate LVH, moderate systolic dysfunction, and diffuse hypokinesis.   Patient's dyspnea improved throughout admission he diuresed well with a net - 6.7 L. Device interrogation was reviewed and he was rate-controlled. He was transitioned to Lasix PO yesterday with continued diuresis  today. Was found to have mildly hypokalemic with successful supplementation.   Today he is stable, asymptomatic, at baseline and will be discharged home. He has been set up with home oxygen as O2 sats dropped to the mid 80s on ambulation. He will followup with [name] tomorrow, at which time further recommendations for the abnormal chest CT will be made. He will continue on outpatient Coumadin, and given a prescription for adjusted Lasix PO. He will also have INR checked next week. He was advised to take Ambien more consistently for insomnia to avoid fatigue the following day.   Discharge Vitals:  Blood pressure 139/84, pulse 81, temperature 97.6 F (36.4 C), temperature source Oral, resp. rate 18, height  5\' 8"  (1.727 m), weight 91.9 kg (202 lb 9.6 oz), SpO2 96.00%.   Labs:  Lab 09/23/11 0510 09/22/11 0840 09/22/11 0545  NA 139 140 147*  K 4.1 4.3 4.2  CL 101 98 102  CO2 31 33* 35*  BUN 17 13 14   CREATININE 0.73 0.70 0.76  CALCIUM 9.1 9.2 9.4  PROT -- -- --  BILITOT -- -- --  ALKPHOS -- -- --  ALT -- -- --  AST -- -- --  AMYLASE -- -- --  LIPASE -- -- --  GLUCOSE 84 167* 79   Disposition: Stable at baseline and will be discharged home today Discharge Orders    Future Appointments: Provider: Department: Dept Phone: Center:   09/24/2011 10:30 AM Sandrea Hughs, MD [facility] [phone] None   09/28/2011 3:45 PM Tiffany Daneil Dan, RN [facility] [phone] None   10/01/2011 1:45 PM [name], MD [facility] [phone] [facility]   10/14/2011 1:45 PM Tiffany Daneil Dan, RN [facility] [phone] None   10/14/2011 2:00 PM Vella Kohler [facility] [place] [phone] [facility]   12/13/2011 1:45 PM Lewayne Bunting, MD [facility] [phone] [facility]     Future Orders Please Complete By Expires   Ambulatory referral to Home Health      Comments:   Please evaluate Dierdre Searles for admission to [facility].  Disciplines requested: Home Health  Aide  Services to provide: Oxygen Via Nasal Cannula at 2 liters/minute continuous  Physician to follow patient's care (the person listed here will be responsible for signing ongoing orders): PCP  Requested Start of Care Date: Today  Special Instructions:  Needs O2 today for transit home.     Follow-up Information    Follow up with Sandrea Hughs, MD on 09/24/2011. (At 10:30 AM. Will need to follow-up with abnormal CT scan during this hospital admission. )    Contact information:   [address] [facility] [address] [place] [address] [place] [address] [phone]       Follow up with [facility] on 09/28/2011. (At 3:45 PM for INR check. )    Contact information:   [address] [place] [address] [phone]         Discharge Medications:  Current Discharge Medication List    START taking these medications   Details  !! warfarin (COUMADIN) 5 MG tablet Take 1 tablet (5 mg total) by mouth daily at 6 PM. Qty: 30 tablet, Refills: 1     !! - Potential duplicate medications found. Please discuss with provider.    CONTINUE these medications which have CHANGED   Details  furosemide (LASIX) 40 MG tablet Take 1 tablet (40 mg total) by mouth 2 (two) times daily. Qty: 60 tablet, Refills: 3      CONTINUE these medications which have NOT CHANGED   Details  albuterol (PROVENTIL HFA;VENTOLIN HFA) 108 (90 BASE) MCG/ACT inhaler Inhale 2 puffs into the lungs every 6 (six) hours as needed for wheezing. Qty: 1 Inhaler, Refills: 0    aspirin 81 MG chewable tablet Chew 81 mg by mouth daily.      clobetasol (TEMOVATE) 0.05 % cream Apply 1 application topically 2 (two) times daily. To affected area    dextromethorphan-guaiFENesin (MUCINEX DM) 30-600 MG per 12 hr tablet Take 1 tablet by mouth as needed. For congestion    famotidine (PEPCID) 20 MG tablet Take 1 tablet (20 mg total) by mouth at bedtime. This medicine is a  similar kind of medicine as  Protonix. Please contact the doctor that prescribed this to make sure they want you to take both.    fluticasone (FLONASE) 50 MCG/ACT nasal spray Place 1 spray into the nose daily.     glyBURIDE (DIABETA) 5 MG tablet Take 10 mg by mouth 2 (two) times daily with a meal.     HYDROcodone-acetaminophen (NORCO) 5-325 MG per tablet Take 1 tablet by mouth every 4 (four) hours as needed. For pain    losartan (COZAAR) 100 MG tablet Take 1 tablet (100 mg total) by mouth daily. Qty: 90 tablet, Refills: 3    metoprolol (LOPRESSOR) 100 MG tablet Take 1 tablet (100 mg total) by mouth 2 (two) times daily. Qty: 60 tablet, Refills: 6    Multiple Vitamins-Minerals (MULTIVITAMINS THER. W/MINERALS) TABS Take 1 tablet by mouth daily.      niacin-lovastatin (ADVICOR) 1000-20 MG 24 hr tablet Take 1 tablet by mouth at bedtime.      pantoprazole (PROTONIX) 40 MG tablet Take 40 mg by mouth every morning.      sitaGLIPtin (JANUVIA) 100 MG tablet Take 1 tablet (100 mg total) by mouth daily. Qty: 90 tablet, Refills: 3    !! warfarin (COUMADIN) 5 MG tablet Take 5 mg by mouth at bedtime.     zolpidem (AMBIEN) 10 MG tablet Take 10 mg by mouth at bedtime as needed. For sleep     !! - Potential duplicate medications found. Please discuss with provider.      Outstanding Labs/Studies: None reported  Duration of Discharge Encounter: 40 minutes including physician time.  Signed, R. Hurman Horn, PA-C 09/23/2011, 2:27 PM

## 2011-09-23 NOTE — Progress Notes (Signed)
   Heart Failure CARE MANAGEMENT NOTE 09/23/2011  Patient:  Bradley [name], Bradley [name]   Account Number:  [id number]  Date Initiated:  09/21/2011  Documentation initiated by:  Shannan Harper  Subjective/Objective Assessment:   Patient admitted with worsening SOB, recently d/c for pacemaker insertion.  Rate changed in MD office on 09/16/11 and patient has not felt well since that time.     Action/Plan:   Lives at home with wife, originally from [place]. Anticipate d/c with HH for CHF mgmt.   Patient refused HH at this time.   Anticipated DC Date:  09/23/2011   Anticipated DC Plan:  HOME/SELF CARE      DC Planning Services  CM consult      PAC Choice  DURABLE MEDICAL EQUIPMENT   Choice offered to / List presented to:  C-1 Patient   DME arranged  OXYGEN      DME agency  [facility]        Status of service:  Completed, signed off Medicare Important Message given?  NA - LOS <3 / Initial given by admissions (If response is "NO", the following Medicare IM given date fields will be blank) Date Medicare IM given:   Date Additional Medicare IM given:    Discharge Disposition:  HOME/SELF CARE  Per UR Regulation:  Reviewed for med. necessity/level of care/duration of stay  Comments:  Heart Failure Patient:  Received a call from Kristen, assigned RN requesting set up for oxygen, choice presented and [facility] chosen.  Darian notified and order confirmed.  TLC completed.  Will assist as needed. 09/23/11 1119 Shannan Harper, RN, BSN  Met with patient and he is well supported at home by wife. He is ambulatory and able to care adequately for himself. He feels he is fine to return home without additional support measures.  Discussed salt intake, daily weights, he has a scale at home, zones, and he did have a heart failure packet at the bedside.  Will continue to assist.  09/22/11 1625 Shannan Harper RN, BSN  UR Completed.  Will meet with patient today to determine needs at discharge. 09/21/11  1015 Shannan Harper, RN, BSN

## 2011-09-23 NOTE — Progress Notes (Signed)
Patient ID: Bradley [name], male   DOB: August 22, 1941, 70 y.o.   MRN: [id number] Subjective:  Dyspnea improved. No chest pain.  Objective:  Vital Signs in the last 24 hours: Temp:  [97.2 F (36.2 C)-98.6 F (37 C)] 97.2 F (36.2 C) (12/13 0608) Pulse Rate:  [80-85] 83  (12/13 0608) Resp:  [20] 20  (12/13 0608) BP: (135-145)/(77-87) 145/87 mmHg (12/13 0608) SpO2:  [94 %-97 %] 95 % (12/13 0608) Weight:  [91.9 kg (202 lb 9.6 oz)] 202 lb 9.6 oz (91.9 kg) (12/13 5784)  Intake/Output from previous day: 12/12 0701 - 12/13 0700 In: -  Out: 1250 [Urine:1250] Intake/Output from this shift: Total I/O In: 240 [P.O.:240] Out: -   Physical Exam: Well appearing NAD HEENT: Unremarkable Neck:  no JVD, no thyromegally Lungs:  Clear except for scattered basilar rales. Well healed PP  Incision. HEART:  Regular rate rhythm, no murmurs, no rubs, no clicks Abd:  soft, positive bowel sounds, no organomegally, no rebound, no guarding Ext:  2 plus pulses, no edema, no cyanosis, no clubbing Skin:  No rashes no nodules Neuro:  CN II through XII intact, motor grossly intact  Lab Results:  Basename 09/20/11 1332  WBC 8.5  HGB 12.6*  PLT 249    Basename 09/23/11 0510 09/22/11 0840  NA 139 140  K 4.1 4.3  CL 101 98  CO2 31 33*  GLUCOSE 84 167*  BUN 17 13  CREATININE 0.73 0.70    Basename 09/20/11 1332  TROPONINI <0.30   Hepatic Function Panel No results found for this basename: PROT,ALBUMIN,AST,ALT,ALKPHOS,BILITOT,BILIDIR,IBILI in the last 72 hours No results found for this basename: CHOL in the last 72 hours No results found for this basename: PROTIME in the last 72 hours  Imaging: CT scan demonstrated lymphadenopathy.   Cardiac Studies: Tele - atrial fib with ventricular pacing. Assessment/Plan:  1. Acute/chronic systolic heart failure - he has had a nice diuresis.  Expect discharge. I have recommended daily weights, low sodium diet, continue meds with additional diuretic (40 mg lasix  bid) 2. Atrial fib - his rate is now controlled. 3. Adenopathy on CT scan - he will require PET scan as an outpatient and followup with pulmonary [name]. 4. Hypokalemia - repleted  LOS: 3 days    Lewayne Bunting 09/23/2011, 9:56 AM

## 2011-09-24 ENCOUNTER — Encounter: Payer: Self-pay | Admitting: Internal Medicine

## 2011-09-24 ENCOUNTER — Ambulatory Visit (INDEPENDENT_AMBULATORY_CARE_PROVIDER_SITE_OTHER): Payer: Medicare Other | Admitting: Internal Medicine

## 2011-09-24 ENCOUNTER — Ambulatory Visit: Payer: Medicare Other | Admitting: Internal Medicine

## 2011-09-24 VITALS — BP 128/70 | HR 81 | Temp 98.0°F | Ht 68.0 in | Wt 210.8 lb

## 2011-09-24 DIAGNOSIS — J9610 Chronic respiratory failure, unspecified whether with hypoxia or hypercapnia: Secondary | ICD-10-CM

## 2011-09-24 NOTE — Patient Instructions (Signed)
Only use your albuterol (proventil or ventolin)  as a rescue medication to be used if you can't catch your breath by resting or doing a relaxed purse lip breathing pattern. The less you use it, the better it will work when you need it. Can use it up to every 4 hours.  Keep on 02 24 hours per day @ 2lpm   Please schedule a follow up office visit in 4 weeks, sooner if needed with pft's and cxr

## 2011-09-24 NOTE — Progress Notes (Signed)
  Subjective:    Patient ID: Bradley [name], male    DOB: Oct 09, 1941, 70 y.o.   MRN: [id number]  HPI  34 yowm quit smoking in 1982 prev eval in 2008 with pleural effusions but all better with no resp issues until Aug 2012 when dx with bronchitis and "downhill since" so referred by Dr Drue Novel back to pulmonary clinic in Nov 2012   08/27/2011 1st pulmonary eval in [facility] cc new onset cough indolent persistent, daily x 3 months,  typically p supper with minimal yellow mucus  Assoc with new sob x sev flights and stop at top of 3 rd -  50% better better p rx with pna no better with albuterol.  No overt sinus or reflux symptoms rec Prednisone 10 mg take  4 each am x 2 days,   2 each am x 2 days,  1 each am x2days and stop  Change lopressor to 50mg  one half in am and one half in pm Protonix 30-60 min before bfast and Pepcid 20mg  one at bedtime  GERD diet   Admit date: 09/20/2011  Discharge date: 09/23/2011  Discharge Diagnoses  Principal Problem:  *Systolic CHF, acute on chronic  Active Problems:  Permanent atrial fibrillation  Hypokalemia  Lymphadenopathy  09/24/2011 Bradley [name] follow-up. Was hospitalized for trouble breathing. Patient states he is better since getting out of the hospital. Denies sob, cough, wheezing, chest pain, and chest tightness.    Sleeping ok without nocturnal  or early am exacerbation  of respiratory  c/o's or need for noct saba. Also denies any obvious fluctuation of symptoms with weather or environmental changes or other aggravating or alleviating factors except as outlined above   ROS  At present neg for  any significant sore throat, dysphagia, itching, sneezing,  nasal congestion or excess/ purulent secretions,  fever, chills, sweats, unintended wt loss, pleuritic or exertional cp, hempoptysis, orthopnea pnd or leg swelling.  Also denies presyncope, palpitations, heartburn, abdominal pain, nausea, vomiting, diarrhea  or change in bowel or urinary habits,  dysuria,hematuria,  rash, arthralgias, visual complaints, headache, numbness weakness or ataxia.            Objective:   Physical Exam  Edentulous wm nad 08/27/2011   217 > 09/24/2011  210 HEENT mild turbinate edema.  Oropharynx no thrush or excess pnd or cobblestoning.  No JVD or cervical adenopathy. Mild accessory muscle hypertrophy. Trachea midline, nl thryroid. Chest was hyperinflated by percussion with diminished breath sounds and moderate increased exp time without wheeze. Hoover sign positive at mid inspiration. Regular rate and rhythm without murmur gallop or rub or increase P2 or edema.  Abd: no hsm, nl excursion. Ext warm without cyanosis or clubbing.    cxr 08/24/11 Chronic interstitial lung disease, similar to previous  examinations.  2. Resolution of edema.       Assessment & Plan:

## 2011-09-25 NOTE — Assessment & Plan Note (Addendum)
Not clear to me as to etiology of his resp failure -  When respiratory symptoms begin well after a patient reports complete smoking cessation,  it is very hard to "blame" COPD specifically or airways disorders in general  ie it doesn't make any more sense than hearing a  [employer] driver wrecked his car while driving his kids to school or a surgeon sliced his hand off carving roast beef (it must be rare indeed!)     That is to say, once the high risk activity stops,  the symptoms should not suddenly erupt or markedly worsen.  If so, the differential diagnosis should include  obesity/deconditioning,  LPR/Reflux/Aspiration syndromes,  occult CHF, or  especially side effect of medications commonly used in this population.    CHF appears to be the most acute, severe issue but also better since initiating aggressive rx for LPR/ GERD > much  Better pm on present rx which I did not change.    Need to regroup with pft's/cxr after the holidays    Each maintenance medication was reviewed in detail including most importantly the difference between maintenance and as needed and under what circumstances the prns are to be used.  Please see instructions for details which were reviewed in writing and the patient given a copy.

## 2011-09-28 ENCOUNTER — Ambulatory Visit (INDEPENDENT_AMBULATORY_CARE_PROVIDER_SITE_OTHER): Payer: Medicare Other | Admitting: *Deleted

## 2011-09-28 DIAGNOSIS — Z7901 Long term (current) use of anticoagulants: Secondary | ICD-10-CM

## 2011-09-28 DIAGNOSIS — I4891 Unspecified atrial fibrillation: Secondary | ICD-10-CM

## 2011-09-28 LAB — POCT INR: INR: 2.7

## 2011-10-01 ENCOUNTER — Ambulatory Visit (INDEPENDENT_AMBULATORY_CARE_PROVIDER_SITE_OTHER): Payer: Medicare Other | Admitting: Internal Medicine

## 2011-10-01 VITALS — BP 158/100 | HR 89 | Temp 97.5°F | Wt 216.0 lb

## 2011-10-01 DIAGNOSIS — J9610 Chronic respiratory failure, unspecified whether with hypoxia or hypercapnia: Secondary | ICD-10-CM

## 2011-10-01 DIAGNOSIS — E119 Type 2 diabetes mellitus without complications: Secondary | ICD-10-CM

## 2011-10-01 DIAGNOSIS — I10 Essential (primary) hypertension: Secondary | ICD-10-CM

## 2011-10-01 DIAGNOSIS — I5032 Chronic diastolic (congestive) heart failure: Secondary | ICD-10-CM

## 2011-10-01 DIAGNOSIS — J189 Pneumonia, unspecified organism: Secondary | ICD-10-CM

## 2011-10-01 DIAGNOSIS — Z23 Encounter for immunization: Secondary | ICD-10-CM

## 2011-10-01 DIAGNOSIS — I509 Heart failure, unspecified: Secondary | ICD-10-CM

## 2011-10-01 MED ORDER — METOPROLOL TARTRATE 100 MG PO TABS: 100.0000 mg | ORAL_TABLET | Freq: Two times a day (BID) | ORAL | Status: DC

## 2011-10-01 MED ORDER — GLYBURIDE 5 MG PO TABS
10.0000 mg | ORAL_TABLET | Freq: Two times a day (BID) | ORAL | Status: DC
Start: 2011-10-01 — End: 2012-05-08

## 2011-10-01 MED ORDER — SITAGLIPTIN PHOSPHATE 100 MG PO TABS: 100.0000 mg | ORAL_TABLET | Freq: Every day | ORAL | Status: DC

## 2011-10-01 MED ORDER — FUROSEMIDE 40 MG PO TABS: 60.0000 mg | ORAL_TABLET | Freq: Two times a day (BID) | ORAL | Status: DC

## 2011-10-01 MED ORDER — LOSARTAN POTASSIUM 100 MG PO TABS: 100.0000 mg | ORAL_TABLET | Freq: Every day | ORAL | Status: DC

## 2011-10-01 MED ORDER — ZOLPIDEM TARTRATE 10 MG PO TABS: 10.0000 mg | ORAL_TABLET | Freq: Every evening | ORAL | Status: DC | PRN

## 2011-10-01 MED ORDER — PANTOPRAZOLE SODIUM 40 MG PO TBEC: 40.0000 mg | DELAYED_RELEASE_TABLET | ORAL | Status: DC

## 2011-10-01 MED ORDER — NIACIN-LOVASTATIN ER 1000-20 MG PO TB24: 1.0000 | ORAL_TABLET | Freq: Every day | ORAL | Status: DC

## 2011-10-01 NOTE — Assessment & Plan Note (Signed)
Per [name]. Has an abnormal CT of the chest, several lymphadenopathies, the patient stated that he already discussed that with [name] and no further workup is needed .

## 2011-10-01 NOTE — Assessment & Plan Note (Signed)
Well-controlled per last hemoglobin A1c 

## 2011-10-01 NOTE — Assessment & Plan Note (Signed)
Doing very well now that he is on oxygen, has a pacemaker. No change, followup by cardiology

## 2011-10-01 NOTE — Assessment & Plan Note (Signed)
BP elevated today but reports that her BPs at home. See instructions

## 2011-10-01 NOTE — Progress Notes (Signed)
  Subjective:    Patient ID: Bradley [name], male    DOB: 10-11-1941, 70 y.o.   MRN: [id number]  HPI Since the last office visit here, he had a pacemaker placed 09/08/2011, also he was admitted to the hospital with shortness of breath and dyspnea on exertion from 12-10 to 12-132012. He was found to be volume overloaded, a CT angiogram showed no PE but several chest adenopathies, an echocardiogram was also performed. After he was diurese, he was d/c home He was also started on oxygen at night and for exertion. Labs reviewed   Past Medical History  Diagnosis Date  . HTN (hypertension)   . HLD (hyperlipidemia)   . DM (diabetes mellitus) 1999    adult onset  . BPH (benign prostatic hyperplasia)     Saw Dr [name] 2004, normal renal u/s  . Leg ulcer 2011  . CHF (congestive heart failure)     Thought primarily to be non-systolic although EF down (EF 78-29% 12/2010, down to 35-40% 09/05/11), cath 2008 with no CAD, nuclear study 07/2011 showing Small area of reversibility in the distal ant/lat wall the left ventricle suspicious for ischemia/septal wall HK but felt to be low risk  (per D/C Summary 07/2011)  . Pleural effusion 2008    S/p decortication  . Insomnia   . Atrial fibrillation     s/p AV node ablation & BiV PPM implantation 09/08/11 (op dictation pending)  . Sinoatrial node dysfunction   . Headache       Review of Systems Since he left the hospital he is doing great. Medications reviewed, he is actually taking Lasix 60 mg twice a day. Currently denies chest pain, fever chills, cough. No nausea, vomiting, diarrhea.     Objective:   Physical Exam  Constitutional: He is oriented to person, place, and time. He appears well-developed and well-nourished. No distress.  HENT:  Head: Normocephalic and atraumatic.  Cardiovascular:       Seems regular today  Pulmonary/Chest: Effort normal and breath sounds normal. No respiratory distress. He has no wheezes. He has no rales.    Musculoskeletal: He exhibits no edema.  Neurological: He is alert and oriented to person, place, and time.  Skin: He is not diaphoretic.  Psychiatric: He has a normal mood and affect. His behavior is normal. Judgment and thought content normal.      Assessment & Plan:  Today , I spent more than 25  min with the patient, >50% of the time counseling, and /or reviewing the chart and labs ordered by other providers

## 2011-10-01 NOTE — Patient Instructions (Signed)
Check the  blood pressure twice a day, be sure it is less than 140/85. If it is consistently higher, let me know

## 2011-10-01 NOTE — Assessment & Plan Note (Addendum)
No pneumonia per recent CT, asymptomatic.  Problem resolved

## 2011-10-04 ENCOUNTER — Inpatient Hospital Stay: Payer: Medicare Other | Admitting: Internal Medicine

## 2011-10-06 ENCOUNTER — Telehealth: Payer: Self-pay | Admitting: Internal Medicine

## 2011-10-06 NOTE — Telephone Encounter (Signed)
New message:  Wife called and wanted to ask about the appointment pt has and if it is advisable for them the reschedule.  Please call and advise

## 2011-10-06 NOTE — Telephone Encounter (Signed)
Spoke with wife she wanted to know if it would be possible to cancel his 1/3 appointment with the device clinic and reschedule for 1/8 when he comes in for coumadin visit.  Appointment changed, wife aware.

## 2011-10-08 ENCOUNTER — Telehealth: Payer: Self-pay | Admitting: Internal Medicine

## 2011-10-08 NOTE — Telephone Encounter (Signed)
New Msg: Curtis from [facility] wanting to discuss order for pt oxygen. Please return call to discuss further.

## 2011-10-08 NOTE — Telephone Encounter (Signed)
Advised them to call Dr [name] for instructions regarding his Oxygen

## 2011-10-08 NOTE — Telephone Encounter (Signed)
I spoke with Bradley [name] and he states that pt will need to have a RA sat at next OV. If pt does not desat below 88% on RA then he will need a SMW on and off oxygen. Will add this to pt's note at next OV

## 2011-10-14 ENCOUNTER — Telehealth: Payer: Self-pay | Admitting: *Deleted

## 2011-10-14 ENCOUNTER — Other Ambulatory Visit: Payer: Self-pay | Admitting: *Deleted

## 2011-10-14 ENCOUNTER — Encounter: Payer: Medicare Other | Admitting: *Deleted

## 2011-10-14 MED ORDER — FUROSEMIDE 40 MG PO TABS: 60.0000 mg | ORAL_TABLET | Freq: Two times a day (BID) | ORAL | Status: DC

## 2011-10-14 NOTE — Telephone Encounter (Signed)
Rx sent 

## 2011-10-14 NOTE — Telephone Encounter (Signed)
Pt c/o increasing SOB to the point that it is hard to catch his breath and a lot of congestion since getting the flu shot. Pt struggling to breath while trying to give symptoms. Pt decline to see any other provider besides Dr Drue Novel. Pt wanting to wait for OV tomorrow with Dr Drue Novel. Pt advise that he needs to be evaluated today due to the difficulty with breathing. Pt ok and will go to the med center now for evaluation

## 2011-10-14 NOTE — Telephone Encounter (Signed)
Agree, please check on him tomorrow

## 2011-10-15 ENCOUNTER — Ambulatory Visit (INDEPENDENT_AMBULATORY_CARE_PROVIDER_SITE_OTHER): Payer: Medicare Other | Admitting: Family

## 2011-10-15 ENCOUNTER — Encounter: Payer: Self-pay | Admitting: Family

## 2011-10-15 DIAGNOSIS — R591 Generalized enlarged lymph nodes: Secondary | ICD-10-CM

## 2011-10-15 DIAGNOSIS — I509 Heart failure, unspecified: Secondary | ICD-10-CM

## 2011-10-15 DIAGNOSIS — I5032 Chronic diastolic (congestive) heart failure: Secondary | ICD-10-CM

## 2011-10-15 DIAGNOSIS — R634 Abnormal weight loss: Secondary | ICD-10-CM

## 2011-10-15 DIAGNOSIS — D649 Anemia, unspecified: Secondary | ICD-10-CM

## 2011-10-15 DIAGNOSIS — R599 Enlarged lymph nodes, unspecified: Secondary | ICD-10-CM

## 2011-10-15 DIAGNOSIS — I10 Essential (primary) hypertension: Secondary | ICD-10-CM

## 2011-10-15 NOTE — Progress Notes (Signed)
Subjective:    Patient ID: Bradley [name], male    DOB: 1941/07/04, 71 y.o.   MRN: [id number]  HPI  Bradley [name] is a 71 yr old male who presents today for follow up.  He wishes to discuss fatigue and malaise- "I just don't feel good." He had PPM placed on 11/28.  He tells me that he has had several adjustments on his pacemaker rate since it was placed. He was admitted to [facility] hospital with shortness of breath and dyspnea on exertion from 12-10 to 12-13.  The patient was diuresed in the hospital and discharged home.  He tells me that his rate has been stable since discharge home from the hospital. He was placed on PRN oxygen in early December due to desaturation with ambulation. Oxygen  was initiated by Dr. Ladona Ridgel.  Pt had flu shot a few weeks ago.  1 week ago, he developed malaise/fatigue.  He denies associated fever or cough.  He has had an associated upset stomach and has some epigastric pain which is intermittent.   He reports that his shortness of breath is unchanged.  He and his wife are concerned about his elevated blood pressure. At home they have seen sbp's 150-170's.  When pt was discharged from the hospital his cardizem was discontinued.    Review of Systems See HPI  Past Medical History  Diagnosis Date  . HTN (hypertension)   . HLD (hyperlipidemia)   . DM (diabetes mellitus) 1999    adult onset  . BPH (benign prostatic hyperplasia)     Saw Dr [name] 2004, normal renal u/s  . Leg ulcer 2011  . CHF (congestive heart failure)     Thought primarily to be non-systolic although EF down (EF 40-98% 12/2010, down to 35-40% 09/05/11), cath 2008 with no CAD, nuclear study 07/2011 showing Small area of reversibility in the distal ant/lat wall the left ventricle suspicious for ischemia/septal wall HK but felt to be low risk  (per D/C Summary 07/2011)  . Pleural effusion 2008    S/p decortication  . Insomnia   . Campath-induced atrial fibrillation     s/p AV node ablation & BiV PPM  implantation 09/08/11 (op dictation pending)  . Sinoatrial node dysfunction   . Headache     History   Social History  . Marital Status: Married    Spouse Name: [place]    Number of Children: 1  . 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 Smoking status: Former Smoker -- 2.5 packs/day for 25 years    Types: Cigarettes    Quit date: 10/11/1980  . Smokeless tobacco: Never Used  . Alcohol Use: Yes     occassionally once a year  . Drug Use: No  . Sexually Active: Not on file   Other Topics Concern  . Not on file   Social History Narrative   Moved from [place] 2003He lives w/ his wife     Past Surgical History  Procedure Date  . Pleural scarification   . Lung decortication   . Colonoscopy 03/10/11    normal  . Pneumothorax with fibrothorax   . Insert / replace / remove pacemaker 09/08/11    pacemaker placement    Family History  Problem Relation Age of Onset  . Diabetes Father   . Breast cancer Maternal Aunt   . Heart attack Neg Hx   . Prostate cancer Neg  Hx   . Colon cancer Neg Hx   . Cancer Neg Hx     colon or prostate    Allergies  Allergen Reactions  . Avelox (Moxifloxacin Hydrochloride) Itching    Headache  . Tadalafil     REACTION: HA, backache    Current Outpatient Prescriptions on File Prior to Visit  Medication Sig Dispense Refill  . albuterol (PROVENTIL HFA;VENTOLIN HFA) 108 (90 BASE) MCG/ACT inhaler Inhale 2 puffs into the lungs every 6 (six) hours as needed for wheezing.  1 Inhaler  0  . aspirin 81 MG chewable tablet Chew 81 mg by mouth daily.        Marland Kitchen dextromethorphan-guaiFENesin (MUCINEX DM) 30-600 MG per 12 hr tablet Take 1 tablet by mouth as needed. For congestion      . famotidine (PEPCID) 20 MG tablet Take 1 tablet (20 mg total) by mouth at bedtime. This medicine is a similar kind of medicine as Protonix. Please contact the doctor that prescribed this to make sure they want  you to take both.      . fluticasone (FLONASE) 50 MCG/ACT nasal spray Place 1 spray into the nose daily.       . furosemide (LASIX) 40 MG tablet Take 1.5 tablets (60 mg total) by mouth 2 (two) times daily.  270 tablet  0  . glyBURIDE (DIABETA) 5 MG tablet Take 2 tablets (10 mg total) by mouth 2 (two) times daily with a meal.  360 tablet  1  . losartan (COZAAR) 100 MG tablet Take 1 tablet (100 mg total) by mouth daily.  90 tablet  1  . metoprolol (LOPRESSOR) 100 MG tablet Take 1 tablet (100 mg total) by mouth 2 (two) times daily.  180 tablet  1  . Multiple Vitamins-Minerals (MULTIVITAMINS THER. W/MINERALS) TABS Take 1 tablet by mouth daily.        . niacin-lovastatin (ADVICOR) 1000-20 MG 24 hr tablet Take 1 tablet by mouth at bedtime.  90 tablet  1  . pantoprazole (PROTONIX) 40 MG tablet Take 1 tablet (40 mg total) by mouth every morning.  90 tablet  1  . sitaGLIPtin (JANUVIA) 100 MG tablet Take 1 tablet (100 mg total) by mouth daily.  90 tablet  1  . warfarin (COUMADIN) 5 MG tablet Take 1 tablet (5 mg total) by mouth daily at 6 PM.  30 tablet  1  . zolpidem (AMBIEN) 10 MG tablet Take 1 tablet (10 mg total) by mouth at bedtime as needed. For sleep  90 tablet  1    BP 162/82  Pulse 83  Temp(Src) 97.9 F (36.6 C) (Oral)  Resp 16  Wt 219 lb (99.338 kg)  SpO2 95%       Objective:   Physical Exam  Constitutional: He is oriented to person, place, and time. He appears well-developed and well-nourished. No distress.  HENT:  Head: Normocephalic and atraumatic.  Mouth/Throat: No oropharyngeal exudate.  Eyes: Conjunctivae are normal. No scleral icterus.  Neck: Normal range of motion. Neck supple.  Cardiovascular: Normal rate and regular rhythm.   No murmur heard. Pulmonary/Chest: No respiratory distress. He has no wheezes. He has no rales.  Musculoskeletal: He exhibits no edema.  Lymphadenopathy:    He has no cervical adenopathy.  Neurological: He is alert and oriented to person, place,  and time.  Skin: Skin is warm and dry. No rash noted. No erythema. No pallor.  Psychiatric: He has a normal mood and affect. His behavior is normal. Judgment and  thought content normal.          Assessment & Plan:

## 2011-10-15 NOTE — Telephone Encounter (Signed)
Pt states that he did not go to ED on yesterday but he did call to see Melissa today. Pt notes that at OV BP was extremely elevated which he states that Efraim Kaufmann was to advise Dr Drue Novel of this. Pt also c/o that he has not felt well in days and is still very fatigue with no energy.

## 2011-10-15 NOTE — Patient Instructions (Signed)
Please follow up with [name] in 2 weeks.  Keep your upcoming appointment with [name]. Go to ER if shortness of breath/chest pain.

## 2011-10-16 ENCOUNTER — Ambulatory Visit (INDEPENDENT_AMBULATORY_CARE_PROVIDER_SITE_OTHER): Payer: Medicare Other | Admitting: Family Medicine

## 2011-10-16 ENCOUNTER — Encounter: Payer: Self-pay | Admitting: Family Medicine

## 2011-10-16 VITALS — BP 152/94 | HR 89 | Temp 96.8°F | Ht 68.0 in | Wt 217.0 lb

## 2011-10-16 DIAGNOSIS — I10 Essential (primary) hypertension: Secondary | ICD-10-CM

## 2011-10-16 LAB — CBC WITH DIFFERENTIAL/PLATELET
Basophils Absolute: 0 10*3/uL (ref 0.0–0.1)
Basophils Relative: 1 % (ref 0–1)
Eosinophils Absolute: 0.1 10*3/uL (ref 0.0–0.7)
Eosinophils Relative: 1 % (ref 0–5)
HCT: 43 % (ref 39.0–52.0)
Hemoglobin: 14 g/dL (ref 13.0–17.0)
Lymphocytes Relative: 21 % (ref 12–46)
Lymphs Abs: 1.7 10*3/uL (ref 0.7–4.0)
MCH: 30.8 pg (ref 26.0–34.0)
MCHC: 32.6 g/dL (ref 30.0–36.0)
MCV: 94.5 fL (ref 78.0–100.0)
Monocytes Absolute: 0.9 10*3/uL (ref 0.1–1.0)
Monocytes Relative: 11 % (ref 3–12)
Neutro Abs: 5.6 10*3/uL (ref 1.7–7.7)
Neutrophils Relative %: 66 % (ref 43–77)
Platelets: 233 10*3/uL (ref 150–400)
RBC: 4.55 MIL/uL (ref 4.22–5.81)
RDW: 15 % (ref 11.5–15.5)
WBC: 8.4 10*3/uL (ref 4.0–10.5)

## 2011-10-16 LAB — TSH: TSH: 2.155 u[IU]/mL (ref 0.350–4.500)

## 2011-10-16 MED ORDER — AMLODIPINE BESYLATE 5 MG PO TABS: 5.0000 mg | ORAL_TABLET | Freq: Every day | ORAL | Status: DC

## 2011-10-16 NOTE — Progress Notes (Signed)
Subjective:    Patient ID: Bradley [name], male    DOB: 07-18-1941, 71 y.o.   MRN: [id number]  HPI  Acute visit. Saturday clinic. Patient accompanied by wife with concerns for elevated blood pressure over the past several weeks and especially over the past few days. Complicated past medical history with history of systolic and diastolic heart failure, atrial fibrillation, pulmonary hypertension, hypertension, hyperlipidemia, and type 2 diabetes. Family noted blood pressure 175/104 yesterday. Unable to get appointment with primary physician. Over the past week they've had several readings over 150-160 range systolic and over 100 diastolic. He has had intermittent headaches and increased malaise over the past couple of weeks.  Medications reviewed. For blood pressure taking losartan 100 mg daily, metoprolol 100 mg twice daily, and furosemide 60 mg twice a day. He had recent pacemaker.  Past Medical History  Diagnosis Date  . HTN (hypertension)   . HLD (hyperlipidemia)   . DM (diabetes mellitus) 1999    adult onset  . BPH (benign prostatic hyperplasia)     Saw Dr [name] 2004, normal renal u/s  . Leg ulcer 2011  . CHF (congestive heart failure)     Thought primarily to be non-systolic although EF down (EF 11-91% 12/2010, down to 35-40% 09/05/11), cath 2008 with no CAD, nuclear study 07/2011 showing Small area of reversibility in the distal ant/lat wall the left ventricle suspicious for ischemia/septal wall HK but felt to be low risk  (per D/C Summary 07/2011)  . Pleural effusion 2008    S/p decortication  . Insomnia   . Campath-induced atrial fibrillation     s/p AV node ablation & BiV PPM implantation 09/08/11 (op dictation pending)  . Sinoatrial node dysfunction   . Headache    Past Surgical History  Procedure Date  . Pleural scarification   . Lung decortication   . Colonoscopy 03/10/11    normal  . Pneumothorax with fibrothorax   . Insert / replace / remove pacemaker 09/08/11   pacemaker placement    reports that he quit smoking about 31 years ago. His smoking use included Cigarettes. He has a 62.5 pack-year smoking history. He has never used smokeless tobacco. He reports that he drinks alcohol. He reports that he does not use illicit drugs. family history includes Breast cancer in his maternal aunt and Diabetes in his father.  There is no history of Heart attack, and Prostate cancer, and Colon cancer, and Cancer, . Allergies  Allergen Reactions  . Avelox (Moxifloxacin Hydrochloride) Itching    Headache  . Tadalafil     REACTION: HA, backache      Review of Systems  Constitutional: Positive for fatigue.  Eyes: Negative for visual disturbance.  Respiratory: Negative for cough, chest tightness, shortness of breath and wheezing.   Cardiovascular: Negative for chest pain, palpitations and leg swelling.  Neurological: Positive for headaches. Negative for dizziness, syncope, weakness and light-headedness.       Objective:   Physical Exam  Constitutional:       Is alert wearing oxygen in no distress  HENT:  Right Ear: External ear normal.  Left Ear: External ear normal.  Neck: Neck supple. No thyromegaly present.  Cardiovascular: Normal rate and regular rhythm.   Pulmonary/Chest: Effort normal and breath sounds normal. No respiratory distress. He has no wheezes. He has no rales.  Musculoskeletal:       Patient has only minimal trace edema legs bilaterally  Lymphadenopathy:    He has no cervical adenopathy.  Assessment & Plan:  Elevated blood pressure.  expressed my reluctance to change chronic medications.  They're quite concerned regarding his blood pressure issue. He has had suboptimal control over the past several weeks.  Add amlodipine 5 mg daily and continue current medications. He has followup with primary scheduled in  a couple of weeks

## 2011-10-16 NOTE — Patient Instructions (Signed)

## 2011-10-17 ENCOUNTER — Encounter: Payer: Self-pay | Admitting: Family

## 2011-10-17 NOTE — Assessment & Plan Note (Signed)
I am concerned that his overall malaise could be related to an underlying malignancy due to the findings on CT chest on 12/10: Enlarging supraclavicular, subpectoral, axillary, mediastinal and hilar adenopathy. He will need a PET scan ordered. It is my understanding that this is the purpose of his upcoming pulmonology follow up apt. Will defer to pulmonary.  I discussed this with the patient and advised him to keep his upcoming apt with Pulmonary.  I will also route this note to his pulmonologist.

## 2011-10-17 NOTE — Assessment & Plan Note (Signed)
Appears clinically euvolemic today on lasix 40mg  po bid.

## 2011-10-17 NOTE — Assessment & Plan Note (Signed)
I told pt that I was hesitant to change any of his medications, especially resuming his cardizem due to his recent pacemaker issues without discussing with Dr. Ladona Ridgel.  I called cardiology on Friday and was told that Dr. Ladona Ridgel was out of the office until Monday.  I now see that the pt was seen the following day in the Saturday clinic and amlodipine was added to his regimen.

## 2011-10-18 ENCOUNTER — Telehealth: Payer: Self-pay | Admitting: Internal Medicine

## 2011-10-18 NOTE — Telephone Encounter (Signed)
He was seen twice last week with blood pressure issues, started on amlodipine 3 days ago. Please check on the patient, BP better?. Please arrange for a followup with me this week

## 2011-10-18 NOTE — Telephone Encounter (Signed)
Noted, see phone note. 

## 2011-10-18 NOTE — Telephone Encounter (Signed)
[facility] Triage Call Report Triage Record Num: [id number] Operator: Patriciaann Clan Patient Name: Bradley [name] Call Date & Time: 10/16/2011 8:32:47AM Patient Phone: [phone] PCP: Nolon Rod. Paz Patient Gender: Male PCP Fax : Patient DOB: [birth year] Practice Name:  - [facility] Reason for Call: Caller: [place]/Spouse; PCP: [name]; CB#: [phone]; Call regarding BP 175/104; Spouse states patient's BP has been elevated X 1 month. States BP 175/104 @ 0800 10/16/11. Denies headache, dizziness, dyspnea or chest pain. States patient "feels like he has no energy." States has had intermittent tingling in left arm, onset 10/15/11 but denies at present. Triage per HTN Protocol. No emergent sx identified. Care advice given per guidelines. Call back parameters reviewed. Advised to call [facility] office @ [phone] @ 0900 10/16/11 for appt. Spouse verbalizes understanding and agreeable. Protocol(s) Used: Hypertension, Diagnosed or Suspected Recommended Outcome per Protocol: See Provider within 72 Hours Reason for Outcome: Multiple elevated blood pressure readings without other symptoms AND no previous work-up OR readings exceed expected range defined by treatment plan Care Advice: Call EMS 911 if new symptoms develop, such as severe shortness of breath, chest pain, change in mental status, acute neurologic deficit, seizure, visual disturbances, pulse rate > 120 / minute, or very irregular pulse. ~ It is important to have blood pressure checked at least annually, or at each provider visit, especially if you have a history of high blood pressure in your family. ~ ~ Call provider if systolic BP is 180 or greater, or if diastolic BP is 120 or greater. ~ HEALTH PROMOTION / MAINTENANCE Avoid the use of stimulants including caffeine (coffee, some soft drinks, some energy drinks, tea and chocolate), cocaine, and amphetamines. Also avoid drinking alcohol.

## 2011-10-18 NOTE — Telephone Encounter (Signed)
Left message to call office

## 2011-10-19 ENCOUNTER — Encounter: Payer: Self-pay | Admitting: Internal Medicine

## 2011-10-19 ENCOUNTER — Ambulatory Visit (INDEPENDENT_AMBULATORY_CARE_PROVIDER_SITE_OTHER): Payer: Medicare Other | Admitting: *Deleted

## 2011-10-19 ENCOUNTER — Ambulatory Visit (INDEPENDENT_AMBULATORY_CARE_PROVIDER_SITE_OTHER): Payer: Medicare Other | Admitting: Internal Medicine

## 2011-10-19 VITALS — BP 158/110 | HR 91 | Ht 68.0 in | Wt 221.0 lb

## 2011-10-19 DIAGNOSIS — Z7901 Long term (current) use of anticoagulants: Secondary | ICD-10-CM

## 2011-10-19 DIAGNOSIS — I4891 Unspecified atrial fibrillation: Secondary | ICD-10-CM

## 2011-10-19 DIAGNOSIS — I10 Essential (primary) hypertension: Secondary | ICD-10-CM

## 2011-10-19 DIAGNOSIS — I428 Other cardiomyopathies: Secondary | ICD-10-CM

## 2011-10-19 LAB — PACEMAKER DEVICE OBSERVATION
AL IMPEDENCE PM: 4047 Ohm
ATRIAL PACING PM: 0
BATTERY VOLTAGE: 3.0526 V
LV LEAD THRESHOLD: 0.5 V
RV LEAD AMPLITUDE: 7.5 mv
RV LEAD IMPEDENCE PM: 589 Ohm
RV LEAD THRESHOLD: 0.5 V
VENTRICULAR PACING PM: 96.1

## 2011-10-19 LAB — POCT INR: INR: 3.1

## 2011-10-19 MED ORDER — CARVEDILOL 25 MG PO TABS: 25.0000 mg | ORAL_TABLET | Freq: Two times a day (BID) | ORAL | Status: DC

## 2011-10-19 NOTE — Progress Notes (Signed)
  Subjective:    Patient ID: Bradley [name], male    DOB: 04/06/1941, 71 y.o.   MRN: [id number]  HPI Since his last office visit he last month, he has been seen twice by other providers. Initially he was seen with elevated blood pressure, fatigue shortness of breath. He was eventually started on amlodipine 3 days ago. His BP remains elevated.  Past Medical History: HYPERTENSION (at some point hard to control, saw Dr Lowell Guitar) HYPERLIPIDEMIA  AODM dx 1999 BENIGN PROSTATIC HYPERTROPHY--saw Dr [name] 2004 , normal renal u/s INSOMNIA, CHRONIC  CV: ATRIAL FIBRILLATION, PAROXYSMAL   (Dr Ladona Ridgel) 03-2007.Marland KitchenMarland KitchenMarland KitchenStress test neg, ECHO 55% 04-2007: carciac cath w/ no high grade stenosis, +  P-HTN CHF s/p AV node ablation & BiV PPM implantation 09/08/11 PULMONARY 2008: h/o  recurrent  loculated right plural effusion, s/p eval Dr [name] ,  Dr Laneta Simmers. W/U included thoracentesis   09-2011 aCT of the chest, several lymphadenopathies. Sees Dr. Elesa Massed ------------------------------- Leg ulcer, dx end of  2011, ABI neg 10-2010, saw vascular (Dr Arbie Cookey 10-2010) -- venous insuff. , conservative treatment   Past Surgical History:  Status post large pneumothorax with fibrothorax.   Status post lung decortication -Dr. Laneta Simmers  Status post cardiac catheterization     Review of Systems In general he is feeling better : no LE edema, energy slightly better. Seems to be tolerating amlodipine without side effects. Since he had a pacemaker inserted, he admits to some chest pain, it lasts a few seconds, is random. The pain is @ the right side of the chest.      Objective:   Physical Exam  Constitutional: He appears well-developed. No distress.  Neck: No JVD present.  Cardiovascular: Normal rate, regular rhythm and normal heart sounds.   No murmur heard. Pulmonary/Chest:       Slightly decreased breath sounds but otherwise clear  Musculoskeletal: He exhibits no edema.  Skin: He is not diaphoretic.         Assessment & Plan:

## 2011-10-19 NOTE — Telephone Encounter (Signed)
Pt states that BP is still running high. 1-5-150/117,  1-6-170/92,  1-7-169/101, Today 150/107. Pt schedule to come in today.

## 2011-10-19 NOTE — Progress Notes (Signed)
PPM check 

## 2011-10-19 NOTE — Telephone Encounter (Signed)
Noted  

## 2011-10-19 NOTE — Patient Instructions (Signed)
Watch your salt intake Stop metoprolol, start carvedilol twice a day Continue amlodipine Call if side effects, cough, wheezing, leg swelling

## 2011-10-19 NOTE — Assessment & Plan Note (Addendum)
Here for evaluation of hypertension, he has a history of difficult to control blood pressure, a few years ago we needed to consult nephrology to keep his blood pressure under control. Recent addition of amlodipine has not yet help with his blood pressure. Renal function is normal, he is on a reasonable dose of Lasix. I discussed the case with Dr. Ladona Ridgel, his cardiologist, hydralazine?.  He suggested a switch from metoprolol to carvedilol 25 mg twice a day and titrate up to 50 mg twice a day as long as there is no respiratory problems recognizing that hydralazine is a good option down the line. Plan:  Switch to carvedilol. Stay on amlodipine for now as long as he does not develop swelling. Office visit in 2 weeks Today , I spent more than  min with the patient, >50% of the time counseling, and coordinating his care

## 2011-10-20 ENCOUNTER — Encounter: Payer: Self-pay | Admitting: Internal Medicine

## 2011-10-28 ENCOUNTER — Ambulatory Visit (INDEPENDENT_AMBULATORY_CARE_PROVIDER_SITE_OTHER): Payer: Medicare Other | Admitting: Internal Medicine

## 2011-10-28 ENCOUNTER — Encounter: Payer: Self-pay | Admitting: Internal Medicine

## 2011-10-28 ENCOUNTER — Telehealth: Payer: Self-pay | Admitting: Internal Medicine

## 2011-10-28 ENCOUNTER — Ambulatory Visit (INDEPENDENT_AMBULATORY_CARE_PROVIDER_SITE_OTHER)
Admission: RE | Admit: 2011-10-28 | Discharge: 2011-10-28 | Disposition: A | Discharge status: Home / Self Care | Payer: Medicare Other | Source: Ambulatory Visit | Attending: Internal Medicine | Admitting: Internal Medicine

## 2011-10-28 VITALS — BP 140/80 | HR 84 | Temp 97.8°F | Ht 68.0 in | Wt 217.0 lb

## 2011-10-28 DIAGNOSIS — J9610 Chronic respiratory failure, unspecified whether with hypoxia or hypercapnia: Secondary | ICD-10-CM

## 2011-10-28 DIAGNOSIS — I5032 Chronic diastolic (congestive) heart failure: Secondary | ICD-10-CM

## 2011-10-28 DIAGNOSIS — I272 Pulmonary hypertension, unspecified: Secondary | ICD-10-CM

## 2011-10-28 DIAGNOSIS — I2789 Other specified pulmonary heart diseases: Secondary | ICD-10-CM

## 2011-10-28 DIAGNOSIS — I509 Heart failure, unspecified: Secondary | ICD-10-CM

## 2011-10-28 LAB — PULMONARY FUNCTION TEST

## 2011-10-28 NOTE — Progress Notes (Signed)
PFT done today. 

## 2011-10-28 NOTE — Assessment & Plan Note (Signed)
-   started on 02 2lpm at discharge 09/23/11   - PFT's 10/28/2011  FEV1  1.40 (51%)  with ratio 70 and no better p B2 and DLCO 53 corrects to 101    No signficant copd based on today's eval and no evidence of asthma either though still at risk and may not be able to tolerate high doses of coreg but so far so good - pulmonary f/u is prn

## 2011-10-28 NOTE — Progress Notes (Signed)
Subjective:    Patient ID: Bradley [name], male    DOB: 1941/09/17     MRN: [id number]   Brief patient profile:  70 yowm quit smoking in 1982 prev pulm  eval in 2008 with pleural effusions but all better with no resp issues until Aug 2012 when dx with bronchitis and "downhill since" so referred by Dr Drue Novel back to pulmonary clinic in Nov 2012 and ultimately proven to have no sign copd by pft's 10/28/2011   HPI 08/27/2011 1st pulmonary eval in [facility] new onset cough indolent persistent, daily x 3 months,  typically p supper with minimal yellow mucus  Assoc with new sob x sev flights and stop at top of 3 rd -  50% better better p rx with pna no better with albuterol.  No overt sinus or reflux symptoms rec Prednisone 10 mg take  4 each am x 2 days,   2 each am x 2 days,  1 each am x2days and stop  Change lopressor to 50mg  one half in am and one half in pm Protonix 30-60 min before bfast and Pepcid 20mg  one at bedtime  GERD diet   Admit date: 09/20/2011  Discharge date: 09/23/2011  Discharge Diagnoses  Principal Problem:  *Systolic CHF, acute on chronic  Permanent atrial fibrillation  Hypokalemia  Lymphadenopathy  09/24/2011 [facility] follow-up. Was hospitalized for trouble breathing. Patient states he is better since getting out of the hospital.  rec Only use your albuterol (proventil or ventolin)  as a rescue medication to be used if you can't catch your breath > not using  Keep on 02 24 hours per day @ 2lpm     10/28/2011 f/u ov/[facility] Shortness of Breath Improved since last visit on 02 and with rx of chf, no cough or daytime need for saba - on 02 can walk flat and slow as much as desired  Sleeping ok without nocturnal  or early am exacerbation  of respiratory  c/o's or need for noct saba. Also denies any obvious fluctuation of symptoms with weather or environmental changes or other aggravating or alleviating factors except as outlined above   ROS  At present neg for  any  significant sore throat, dysphagia, itching, sneezing,  nasal congestion or excess/ purulent secretions,  fever, chills, sweats, unintended wt loss, pleuritic or exertional cp, hempoptysis, orthopnea pnd or leg swelling.  Also denies presyncope, palpitations, heartburn, abdominal pain, nausea, vomiting, diarrhea  or change in bowel or urinary habits, dysuria,hematuria,  rash, arthralgias, visual complaints, headache, numbness weakness or ataxia.            Objective:   Physical Exam  Edentulous wm nad 08/27/2011   217 > 09/24/2011  210 > 217 10/28/2011  HEENT mild turbinate edema.  Oropharynx no thrush or excess pnd or cobblestoning.  No JVD or cervical adenopathy. Mild accessory muscle hypertrophy. Trachea midline, nl thryroid. Chest was hyperinflated by percussion with diminished breath sounds and mild increased exp time without wheeze. Hoover sign positive at end  inspiration. Regular rate and rhythm without murmur gallop or rub or increase P2 or edema.  Abd: no hsm, nl excursion. Ext warm without cyanosis or clubbing.      CXR  10/28/2011 :  Comparison: 09/20/2011  Findings: There is a left chest wall pacer device with lead in the right atrial appendage and right ventricle.  The heart size appears mildly enlarged.  There are bilateral pleural effusions and interstitial edema which appears similar  to previous exam.  IMPRESSION: Mild CHF.        Assessment & Plan:

## 2011-10-28 NOTE — Patient Instructions (Signed)
Stay on 02 24 hours per day - this will help your heart  You do not have significant lung problems other than what is related to the congestive heart failure so pulmonary follow up will be as needed

## 2011-10-28 NOTE — Assessment & Plan Note (Addendum)
Echo reviewed from 09/22/11 and does not support a primary pulmonary component as the R sided abn's are mild while the L side are moderate > in any case 02 is the best option for rx > rec continue 02 24 hours per day

## 2011-10-28 NOTE — Telephone Encounter (Signed)
Pt requests MW's nurse call him back.  Antionette Fairy

## 2011-10-28 NOTE — Telephone Encounter (Signed)
Called spoke with patient who stated he received a phone call from Calvin from [employer] stating that a signed release was required in order to receive his o2 supplies.  No recent order, no mention in ov note from today except pt was recertified for his o2.  Number given for DME company by pt's wife: [phone] for [facility].  Called spoke with Lyda Jester who stated that he called our office for a copy of the ov note requalifying pt for his o2 but was directed to medical records who informed him that they needed a signed release.  Per Lyda Jester, pt is switching DME companies because his current company does not supply a battery operated concentrator.  Per 12.28.12 phone note, the need for the qualifying o2 sat was noted for the upcoming ov.  Per TD, okay to send ov note.  Fax: [phone] Trecia Rogers.  Called spoke with patient, advised of my findings as stated above.  Ov note printed, snap shot printed with o2 qualification numbers and faxed to above fax # attn Lyda Jester.  Nothing further needed.

## 2011-11-02 ENCOUNTER — Encounter: Payer: Self-pay | Admitting: Internal Medicine

## 2011-11-02 ENCOUNTER — Ambulatory Visit (INDEPENDENT_AMBULATORY_CARE_PROVIDER_SITE_OTHER): Payer: Medicare Other | Admitting: Internal Medicine

## 2011-11-02 VITALS — BP 124/76 | HR 80 | Temp 97.4°F | Resp 16 | Wt 217.1 lb

## 2011-11-02 DIAGNOSIS — I10 Essential (primary) hypertension: Secondary | ICD-10-CM

## 2011-11-02 DIAGNOSIS — J9610 Chronic respiratory failure, unspecified whether with hypoxia or hypercapnia: Secondary | ICD-10-CM

## 2011-11-02 DIAGNOSIS — E119 Type 2 diabetes mellitus without complications: Secondary | ICD-10-CM

## 2011-11-02 MED ORDER — FUROSEMIDE 40 MG PO TABS: 60.0000 mg | ORAL_TABLET | Freq: Two times a day (BID) | ORAL | Status: DC

## 2011-11-02 MED ORDER — AMLODIPINE BESYLATE 5 MG PO TABS: 5.0000 mg | ORAL_TABLET | Freq: Every day | ORAL | Status: DC

## 2011-11-02 MED ORDER — CARVEDILOL 25 MG PO TABS: 25.0000 mg | ORAL_TABLET | Freq: Two times a day (BID) | ORAL | Status: DC

## 2011-11-02 NOTE — Progress Notes (Signed)
  Subjective:    Patient ID: Bradley [name], male    DOB: 11/25/1940, 71 y.o.   MRN: [id number]  HPI Followup from previous visit, good medication compliance.  Past Medical History:  HYPERTENSION (at some point hard to control, saw Dr Lowell Guitar)  [name]  AODM dx 1999  BENIGN PROSTATIC HYPERTROPHY--saw Dr [name] 2004 , normal renal u/s  INSOMNIA, CHRONIC  CV:  ---ATRIAL FIBRILLATION, PAROXYSMAL (Dr Ladona Ridgel)  ---03-2007.Marland KitchenMarland KitchenMarland KitchenStress test neg, ECHO 55%  ---04-2007: carciac cath w/ no high grade stenosis, + P-HTN  ---CHF  ---s/p AV node ablation & BiV PPM implantation 09/08/11  PULMONARY  ---2008: h/o recurrent loculated right plural effusion, s/p eval Dr [name] , Dr Laneta Simmers. W/U included thoracentesis  --09-2011 aCT of the chest, several lymphadenopathies. Sees Dr. Elesa Massed  Leg ulcer, dx end of 2011, ABI neg 10-2010, saw vascular (Dr Arbie Cookey 10-2010) -- venous insuff. , conservative treatment    Review of Systems Ambulatory blood pressures improving, in the 150s over 80s. Today his blood pressure is very good. Pulse in the 80s. Denies lower extremity edema Diabetes well controlled, ambulatory blood sugars sometimes in the 70s with no hypoglycemic symptoms.     Objective:   Physical Exam  Constitutional: He is oriented to person, place, and time. He appears well-developed. No distress.  Musculoskeletal: Edema: trace lower extremity edema, at baseline.  Neurological: He is alert and oriented to person, place, and time.  Skin: He is not diaphoretic.  Psychiatric: He has a normal mood and affect. His behavior is normal. Judgment and thought content normal.       Assessment & Plan:

## 2011-11-02 NOTE — Patient Instructions (Signed)
Check the  blood pressure 2 or 3 times a week, be sure it is less than 145/85. If it is consistently higher, let me know Call if swelling in the legs gets worse    Hypoglycemia (Low Blood Sugar) Hypoglycemia is when the glucose (sugar) in your blood is too low. Hypoglycemia can happen for many reasons. It can happen to people with or without diabetes. Hypoglycemia can develop quickly and can be a medical emergency.  CAUSES  Having hypoglycemia does not mean that you will develop diabetes. Different causes include:  Missed or delayed meals or not enough carbohydrates eaten.   Medication overdose. This could be by accident or deliberate. If by accident, your medication may need to be adjusted or changed.   Exercise or increased activity without adjustments in carbohydrates or medications.   A nerve disorder that affects body functions like your heart rate, blood pressure and digestion (autonomic neuropathy).   A condition where the stomach muscles do not function properly (gastroparesis). Therefore, medications may not absorb properly.   The inability to recognize the signs of hypoglycemia (hypoglycemic unawareness).   Absorption of insulin - may be altered.   Alcohol consumption.   Pregnancy/menstrual cycles/postpartum. This may be due to hormones.   Certain kinds of tumors. This is very rare.  SYMPTOMS   Sweating.   Hunger.   Dizziness.   Blurred vision.   Drowsiness.   Weakness.   Headache.   Rapid heart beat.   Shakiness.   Nervousness.  DIAGNOSIS  Diagnosis is made by monitoring blood glucose in one or all of the following ways:  Fingerstick blood glucose monitoring.   Laboratory results.  TREATMENT  If you think your blood glucose is low:  Check your blood glucose, if possible. If it is less than 70 mg/dl, take one of the following:   3-4 glucose tablets.    cup juice (prefer clear like apple).    cup "regular" soda pop.   1 cup milk.   -1  tube of glucose gel.   5-6 hard candies.   Do not over treat because your blood glucose (sugar) will only go too high.   Wait 15 minutes and recheck your blood glucose. If it is still less than 70 mg/dl (or below your target range), repeat treatment.   Eat a snack if it is more than one hour until your next meal.  Sometimes, your blood glucose may go so low that you are unable to treat yourself. You may need someone to help you. You may even pass out or be unable to swallow. This may require you to get an injection of glucagon, which raises the blood glucose. HOME CARE INSTRUCTIONS  Check blood glucose as recommended by your caregiver.   Take medication as prescribed by your caregiver.   Follow your meal plan. Do not skip meals. Eat on time.   If you are going to drink alcohol, drink it only with meals.   Check your blood glucose before driving.   Check your blood glucose before and after exercise. If you exercise longer or different than usual, be sure to check blood glucose more frequently.   Always carry treatment with you. Glucose tablets are the easiest to carry.   Always wear medical alert jewelry or carry some form of identification that states that you have diabetes. This will alert people that you have diabetes. If you have hypoglycemia, they will have a better idea on what to do.  SEEK MEDICAL CARE IF:  You are having problems keeping your blood sugar at target range.   You are having frequent episodes of hypoglycemia.   You feel you might be having side effects from your medicines.   You have symptoms of an illness that is not improving after 3-4 days.   You notice a change in vision or a new problem with your vision.  SEEK IMMEDIATE MEDICAL CARE IF:   You are a family member or friend of a person whose blood glucose goes below 70 mg/dl and is accompanied by:   Confusion.   A change in mental status.   The inability to swallow.   Passing out.  Document  Released: 09/27/2005 Document Revised: 06/09/2011 Document Reviewed: 05/22/2009 [facility] Patient Information 2012 [place], [place].

## 2011-11-02 NOTE — Assessment & Plan Note (Signed)
Improving since we switched to carvedilol. Not developing swelling so far with amlodipine. No change

## 2011-11-02 NOTE — Assessment & Plan Note (Signed)
Clear it by pulmonary, followup with him when necessary. Was recommended to discontinue albuterol and continue with oxygen for now.

## 2011-11-02 NOTE — Assessment & Plan Note (Signed)
Under excellent control, I reminded the patient symptoms of hypoglycemia. See instructions. To take sugar supplements if symptomatic or if blood sugars less than 60

## 2011-11-03 ENCOUNTER — Encounter: Payer: Self-pay | Admitting: Internal Medicine

## 2011-11-08 ENCOUNTER — Telehealth: Payer: Self-pay | Admitting: Internal Medicine

## 2011-11-08 NOTE — Telephone Encounter (Signed)
Pt has questions re pacemaker

## 2011-11-08 NOTE — Telephone Encounter (Signed)
Patient wanted to know if was ok for him to sit in a hot tub and /or go in a steam room.  The patient was advised against both until he sees Dr. Ladona Ridgel for his 3 month post op visit in March.  The patient is in agreement.

## 2011-11-12 ENCOUNTER — Ambulatory Visit: Payer: Medicare Other | Admitting: Internal Medicine

## 2011-11-12 ENCOUNTER — Encounter: Payer: Medicare Other | Admitting: Internal Medicine

## 2011-11-16 ENCOUNTER — Ambulatory Visit (INDEPENDENT_AMBULATORY_CARE_PROVIDER_SITE_OTHER): Payer: Medicare Other | Admitting: *Deleted

## 2011-11-16 DIAGNOSIS — Z7901 Long term (current) use of anticoagulants: Secondary | ICD-10-CM

## 2011-11-16 DIAGNOSIS — I4891 Unspecified atrial fibrillation: Secondary | ICD-10-CM

## 2011-11-16 LAB — POCT INR: INR: 2.8

## 2011-12-07 ENCOUNTER — Ambulatory Visit (INDEPENDENT_AMBULATORY_CARE_PROVIDER_SITE_OTHER): Payer: Medicare Other | Admitting: *Deleted

## 2011-12-07 DIAGNOSIS — Z7901 Long term (current) use of anticoagulants: Secondary | ICD-10-CM

## 2011-12-07 DIAGNOSIS — I4891 Unspecified atrial fibrillation: Secondary | ICD-10-CM

## 2011-12-07 LAB — POCT INR: INR: 2.8

## 2011-12-08 ENCOUNTER — Telehealth: Payer: Self-pay

## 2011-12-08 MED ORDER — GLUCOSE BLOOD VI STRP: ORAL_STRIP | Status: DC

## 2011-12-08 NOTE — Telephone Encounter (Signed)
Patient requesting a prescription for Glucometer test strips be sent to [facility].  Prescription printed and faxed.

## 2011-12-13 ENCOUNTER — Ambulatory Visit (INDEPENDENT_AMBULATORY_CARE_PROVIDER_SITE_OTHER): Payer: Medicare Other | Admitting: Internal Medicine

## 2011-12-13 ENCOUNTER — Encounter: Payer: Self-pay | Admitting: Internal Medicine

## 2011-12-13 DIAGNOSIS — I5032 Chronic diastolic (congestive) heart failure: Secondary | ICD-10-CM

## 2011-12-13 DIAGNOSIS — I509 Heart failure, unspecified: Secondary | ICD-10-CM

## 2011-12-13 DIAGNOSIS — Z950 Presence of cardiac pacemaker: Secondary | ICD-10-CM | POA: Insufficient documentation

## 2011-12-13 DIAGNOSIS — I4891 Unspecified atrial fibrillation: Secondary | ICD-10-CM

## 2011-12-13 DIAGNOSIS — I5022 Chronic systolic (congestive) heart failure: Secondary | ICD-10-CM

## 2011-12-13 LAB — PACEMAKER DEVICE OBSERVATION
AL IMPEDENCE PM: 4047 Ohm
ATRIAL PACING PM: 0
BATTERY VOLTAGE: 3.0389 V
LV LEAD THRESHOLD: 0.75 V
RV LEAD AMPLITUDE: 7.5 mv
RV LEAD IMPEDENCE PM: 703 Ohm
RV LEAD THRESHOLD: 0.75 V
VENTRICULAR PACING PM: 98.88

## 2011-12-13 MED ORDER — CARVEDILOL 25 MG PO TABS: 37.5000 mg | ORAL_TABLET | Freq: Two times a day (BID) | ORAL | Status: DC

## 2011-12-13 NOTE — Assessment & Plan Note (Signed)
His ventricular rate is well controlled. He will continue his current medical therapy. 

## 2011-12-13 NOTE — Patient Instructions (Signed)
Your physician wants you to follow-up in: 6 months.  You will receive a reminder letter in the mail two months in advance. If you don't receive a letter, please call our office to schedule the follow-up appointment.  Discontinue Amlodipine when current is used up.  Then Increase the Coreg to 1.5 tabs twice daily.

## 2011-12-13 NOTE — Assessment & Plan Note (Addendum)
His symptoms are now class II. He will continue a low-sodium diet and his current medical therapy. In addition, I have asked the patient to stop amlodipine and we will uptitrate carvedilol.

## 2011-12-13 NOTE — Assessment & Plan Note (Signed)
His biventricular pacemaker is working normally. We'll plan to recheck in several months. 

## 2011-12-13 NOTE — Progress Notes (Signed)
HPI [name] returns today for followup. He is a 71 year old man with a nonischemic cardiomyopathy, chronic atrial fibrillation, chronic systolic heart failure, status post biventricular pacemaker insertion and AV node ablation. The patient has done well in the past month with volume control. Prior to this he had had hospitalization for congestive heart failure. The patient has been weighing himself daily and adjusting his Lasix dose accordingly. He is also trying to eat less. He is maintaining a low sodium diet. Allergies  Allergen Reactions  . Avelox (Moxifloxacin Hydrochloride) Itching    Headache  . Tadalafil     REACTION: HA, backache     Current Outpatient Prescriptions  Medication Sig Dispense Refill  . aspirin 81 MG chewable tablet Chew 81 mg by mouth daily.        . carvedilol (COREG) 25 MG tablet Take 1.5 tablets (37.5 mg total) by mouth 2 (two) times daily with a meal.  90 tablet  3  . dextromethorphan-guaiFENesin (MUCINEX DM) 30-600 MG per 12 hr tablet Take 1 tablet by mouth as needed. For congestion      . famotidine (PEPCID) 20 MG tablet Take 1 tablet (20 mg total) by mouth at bedtime. This medicine is a similar kind of medicine as Protonix. Please contact the doctor that prescribed this to make sure they want you to take both.      . fluticasone (FLONASE) 50 MCG/ACT nasal spray Place 1 spray into the nose daily.       . furosemide (LASIX) 40 MG tablet Take 1.5 tablets (60 mg total) by mouth 2 (two) times daily.  270 tablet  1  . glucose blood (ONE TOUCH ULTRA TEST) test strip Use as instructed  100 each  12  . glyBURIDE (DIABETA) 5 MG tablet Take 2 tablets (10 mg total) by mouth 2 (two) times daily with a meal.  360 tablet  1  . losartan (COZAAR) 100 MG tablet Take 1 tablet (100 mg total) by mouth daily.  90 tablet  1  . metoprolol (LOPRESSOR) 100 MG tablet Take 100 mg by mouth 2 (two) times daily.      . Multiple Vitamins-Minerals (MULTIVITAMINS THER. W/MINERALS) TABS Take 1  tablet by mouth daily.        . niacin-lovastatin (ADVICOR) 1000-20 MG 24 hr tablet Take 1 tablet by mouth at bedtime.  90 tablet  1  . NON FORMULARY Place into the nose daily. 2 liters of oxygen      . pantoprazole (PROTONIX) 40 MG tablet Take 1 tablet (40 mg total) by mouth every morning.  90 tablet  1  . sitaGLIPtin (JANUVIA) 100 MG tablet Take 1 tablet (100 mg total) by mouth daily.  90 tablet  1  . warfarin (COUMADIN) 5 MG tablet Take 1 tablet (5 mg total) by mouth daily at 6 PM.  30 tablet  1  . zolpidem (AMBIEN) 10 MG tablet Take 1 tablet (10 mg total) by mouth at bedtime as needed. For sleep  90 tablet  1     Past Medical History  Diagnosis Date  . HTN (hypertension)   . HLD (hyperlipidemia)   . DM (diabetes mellitus) 1999    adult onset  . BPH (benign prostatic hyperplasia)     Saw Dr [name] 2004, normal renal u/s  . Leg ulcer 2011  . CHF (congestive heart failure)     Thought primarily to be non-systolic although EF down (EF 40-98% 12/2010, down to 35-40% 09/05/11), cath 2008 with no CAD,  nuclear study 07/2011 showing Small area of reversibility in the distal ant/lat wall the left ventricle suspicious for ischemia/septal wall HK but felt to be low risk  (per D/C Summary 07/2011)  . Pleural effusion 2008    S/p decortication  . Insomnia   . Atrial fibrillation     s/p AV node ablation & BiV PPM implantation 09/08/11 (op dictation pending)  . Sinoatrial node dysfunction   . Headache   . Pulmonary HTN     ROS:   All systems reviewed and negative except as noted in the HPI.   Past Surgical History  Procedure Date  . Pleural scarification   . Lung decortication   . Colonoscopy 03/10/11    normal  . Pneumothorax with fibrothorax   . Insert / replace / remove pacemaker 09/08/11    pacemaker placement     Family History  Problem Relation Age of Onset  . Diabetes Father   . Breast cancer Maternal Aunt   . Heart attack Neg Hx   . Prostate cancer Neg Hx   . Colon  cancer Neg Hx   . Cancer Neg Hx     colon or prostate  . Coronary artery disease Father   . Polycythemia Mother      History   Social History  . Marital Status: Married    Spouse Name: [place]    Number of Children: 1  . 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 Smoking status: Former Smoker -- 2.5 packs/day for 25 years    Types: Cigarettes    Quit date: 10/11/1980  . Smokeless tobacco: Never Used  . Alcohol Use: Yes     occassionally once a year  . Drug Use: No  . Sexually Active: Not on file   Other Topics Concern  . Not on file   Social History Narrative   Moved from [place] 2003He lives w/ his wife      BP 170/92  Pulse 72  Ht 5\' 8"  (1.727 m)  Wt 100.245 kg (221 lb)  BMI 33.60 kg/m2  Physical Exam:  Well appearing and in NAD HEENT: Unremarkable Neck:  No JVD, no thyromegally Lungs:  Clear with no wheezes, rales, rhonchi. Well-healed pacemaker incision. HEART:  Regular rate rhythm, no murmurs, no rubs, no clicks Abd:  soft, positive bowel sounds, no organomegally, no rebound, no guarding Ext:  2 plus pulses, no edema, no cyanosis, no clubbing Skin:  No rashes no nodules Neuro:  CN II through XII intact, motor grossly intact  DEVICE  Normal device function.  See PaceArt for details.   Assess/Plan:

## 2011-12-29 ENCOUNTER — Telehealth: Payer: Self-pay | Admitting: Internal Medicine

## 2011-12-29 NOTE — Telephone Encounter (Signed)
Please call [phone] ext. [phone], wt/CMW for this patient. The need these order so they can bill patients medicare. They said they have requested this via fax several time. If you do not have please let me know and I will call her back  Thanks

## 2011-12-29 NOTE — Telephone Encounter (Signed)
I haven't seen anything. Make sure they have the right fax # [phone].

## 2011-12-30 NOTE — Telephone Encounter (Signed)
Put this at your work station Thanks

## 2012-01-04 ENCOUNTER — Ambulatory Visit (INDEPENDENT_AMBULATORY_CARE_PROVIDER_SITE_OTHER): Payer: Medicare Other

## 2012-01-04 DIAGNOSIS — I4891 Unspecified atrial fibrillation: Secondary | ICD-10-CM

## 2012-01-04 DIAGNOSIS — Z7901 Long term (current) use of anticoagulants: Secondary | ICD-10-CM

## 2012-01-04 LAB — POCT INR: INR: 3.1

## 2012-01-31 ENCOUNTER — Ambulatory Visit (INDEPENDENT_AMBULATORY_CARE_PROVIDER_SITE_OTHER): Payer: Medicare Other | Admitting: Internal Medicine

## 2012-01-31 ENCOUNTER — Encounter: Payer: Self-pay | Admitting: Internal Medicine

## 2012-01-31 VITALS — BP 144/82 | HR 100 | Temp 97.5°F | Wt 207.6 lb

## 2012-01-31 DIAGNOSIS — E119 Type 2 diabetes mellitus without complications: Secondary | ICD-10-CM

## 2012-01-31 DIAGNOSIS — I10 Essential (primary) hypertension: Secondary | ICD-10-CM

## 2012-01-31 DIAGNOSIS — G4700 Insomnia, unspecified: Secondary | ICD-10-CM

## 2012-01-31 LAB — HEMOGLOBIN A1C: Hgb A1c MFr Bld: 6.6 % — ABNORMAL HIGH (ref 4.6–6.5)

## 2012-01-31 MED ORDER — SITAGLIPTIN PHOSPHATE 100 MG PO TABS: 100.0000 mg | ORAL_TABLET | Freq: Every day | ORAL | Status: DC

## 2012-01-31 MED ORDER — CARVEDILOL 25 MG PO TABS: 37.5000 mg | ORAL_TABLET | Freq: Two times a day (BID) | ORAL | Status: DC

## 2012-01-31 MED ORDER — ZOLPIDEM TARTRATE 10 MG PO TABS: 10.0000 mg | ORAL_TABLET | Freq: Every evening | ORAL | Status: DC | PRN

## 2012-01-31 MED ORDER — LOSARTAN POTASSIUM 100 MG PO TABS: 100.0000 mg | ORAL_TABLET | Freq: Every day | ORAL | Status: DC

## 2012-01-31 MED ORDER — NIACIN-LOVASTATIN ER 1000-20 MG PO TB24: 1.0000 | ORAL_TABLET | Freq: Every day | ORAL | Status: DC

## 2012-01-31 MED ORDER — FUROSEMIDE 40 MG PO TABS: 60.0000 mg | ORAL_TABLET | Freq: Two times a day (BID) | ORAL | Status: DC

## 2012-01-31 NOTE — Assessment & Plan Note (Signed)
Very well controlled , labs 

## 2012-01-31 NOTE — Assessment & Plan Note (Signed)
RF meds  

## 2012-01-31 NOTE — Progress Notes (Signed)
  Subjective:    Patient ID: Bradley [name], male    DOB: 21-Mar-1941, 71 y.o.   MRN: [id number]  HPI Routine office visit His wife was very ill in the hospital with encephalitis, she is now at the nursing home, patient obviously distressed. Hypertension, last month his BP was elevated at the cardiologist office, amlodipine was discontinued, he was started on carvedilol. Since then BPs are better. Diabetes, good medication compliance, ambulatory blood sugars around 90-100, no hypoglycemic symptoms Needs a number of medications refill.  Past Medical History:  HYPERTENSION (at some point hard to control, saw Dr Lowell Guitar)  [name]  AODM dx 1999  BENIGN PROSTATIC HYPERTROPHY--saw Dr [name] 2004 , normal renal u/s  INSOMNIA, CHRONIC  CV:  ---ATRIAL FIBRILLATION, PAROXYSMAL (Dr Ladona Ridgel)  ---03-2007.Marland KitchenMarland KitchenMarland KitchenStress test neg, ECHO 55%  ---04-2007: carciac cath w/ no high grade stenosis, + P-HTN  ---CHF  ---s/p AV node ablation & BiV PPM implantation 09/08/11  PULMONARY  ---2008: h/o recurrent loculated right plural effusion, s/p eval Dr [name] , Dr Laneta Simmers. W/U included thoracentesis  --09-2011 aCT of the chest, several lymphadenopathies. Sees Dr. Elesa Massed  Leg ulcer, dx end of 2011, ABI neg 10-2010, saw vascular (Dr Arbie Cookey 10-2010) -- venous insuff. , conservative treatment   Past Surgical History:  Status post large pneumothorax with fibrothorax.    Status post lung decortication -Dr. Laneta Simmers  Status post cardiac catheterization     Review of Systems No chest pain, shortness of breath. Lower extremity edema well controlled. No nausea, vomiting, diarrhea.     Objective:   Physical Exam  General -- alert, well-developed, and overweight appearing. No apparent distress.  Lungs -- normal respiratory effort, no intercostal retractions, no accessory muscle use, and normal breath sounds.   Heart-- normal rate, regular rhythm, no murmur, and no gallop.   Extremities-- no pretibial edema bilaterally    Neurologic-- alert & oriented X3 and strength normal in all extremities. Psych-- Cognition and judgment appear intact. Alert and cooperative with normal attention span and concentration.  not anxious appearing and not depressed appearing.       Assessment & Plan:  Counseled about his wife

## 2012-01-31 NOTE — Assessment & Plan Note (Signed)
Last month, his BP was elevated at the cardiologist's office, amlodipine was discontinued, apparently he was taking metoprolol but is now on carvedilol. Ambulatory blood pressures in the 130/80 range. Rarely they go up to 140/90. No change for now.

## 2012-02-01 ENCOUNTER — Ambulatory Visit (INDEPENDENT_AMBULATORY_CARE_PROVIDER_SITE_OTHER): Payer: Medicare Other | Admitting: Pharmacist

## 2012-02-01 ENCOUNTER — Encounter: Payer: Self-pay | Admitting: Internal Medicine

## 2012-02-01 DIAGNOSIS — I4891 Unspecified atrial fibrillation: Secondary | ICD-10-CM

## 2012-02-01 DIAGNOSIS — Z7901 Long term (current) use of anticoagulants: Secondary | ICD-10-CM

## 2012-02-01 LAB — BASIC METABOLIC PANEL
BUN: 21 mg/dL (ref 6–23)
CO2: 32 mEq/L (ref 19–32)
Calcium: 8.8 mg/dL (ref 8.4–10.5)
Chloride: 102 mEq/L (ref 96–112)
Creatinine, Ser: 0.9 mg/dL (ref 0.4–1.5)
GFR: 84.09 mL/min (ref >60.00)
Glucose, Bld: 142 mg/dL — ABNORMAL HIGH (ref 70–99)
Potassium: 4.5 mEq/L (ref 3.5–5.1)
Sodium: 142 mEq/L (ref 135–145)

## 2012-02-01 LAB — POCT INR: INR: 3

## 2012-02-01 MED ORDER — WARFARIN SODIUM 5 MG PO TABS: ORAL_TABLET | ORAL | Status: DC

## 2012-02-03 ENCOUNTER — Encounter: Payer: Self-pay | Admitting: *Deleted

## 2012-02-09 ENCOUNTER — Telehealth: Payer: Self-pay | Admitting: Internal Medicine

## 2012-02-09 NOTE — Telephone Encounter (Signed)
needs Cert. Of Med. Ness. length of time that was re-faxed to you Please call chelsea at [phone] ext [phone]

## 2012-02-10 NOTE — Telephone Encounter (Signed)
Refaxed paperwork  

## 2012-02-14 ENCOUNTER — Other Ambulatory Visit: Payer: Self-pay | Admitting: *Deleted

## 2012-02-14 MED ORDER — NIACIN-LOVASTATIN ER 1000-20 MG PO TB24: 1.0000 | ORAL_TABLET | Freq: Every day | ORAL | Status: DC

## 2012-02-14 MED ORDER — FUROSEMIDE 40 MG PO TABS: 60.0000 mg | ORAL_TABLET | Freq: Two times a day (BID) | ORAL | Status: DC

## 2012-02-14 MED ORDER — ZOLPIDEM TARTRATE 10 MG PO TABS: 10.0000 mg | ORAL_TABLET | Freq: Every evening | ORAL | Status: DC | PRN

## 2012-02-14 MED ORDER — CARVEDILOL 25 MG PO TABS: 37.5000 mg | ORAL_TABLET | Freq: Two times a day (BID) | ORAL | Status: DC

## 2012-02-14 MED ORDER — SITAGLIPTIN PHOSPHATE 100 MG PO TABS: 100.0000 mg | ORAL_TABLET | Freq: Every day | ORAL | Status: DC

## 2012-02-14 MED ORDER — LOSARTAN POTASSIUM 100 MG PO TABS: 100.0000 mg | ORAL_TABLET | Freq: Every day | ORAL | Status: DC

## 2012-02-18 ENCOUNTER — Other Ambulatory Visit: Payer: Self-pay | Admitting: Pharmacist

## 2012-02-18 DIAGNOSIS — Z7901 Long term (current) use of anticoagulants: Secondary | ICD-10-CM

## 2012-02-18 DIAGNOSIS — I4891 Unspecified atrial fibrillation: Secondary | ICD-10-CM

## 2012-02-18 MED ORDER — WARFARIN SODIUM 5 MG PO TABS: ORAL_TABLET | ORAL | Status: DC

## 2012-02-22 ENCOUNTER — Other Ambulatory Visit: Payer: Self-pay | Admitting: Pharmacist

## 2012-02-22 DIAGNOSIS — Z7901 Long term (current) use of anticoagulants: Secondary | ICD-10-CM

## 2012-02-22 DIAGNOSIS — I4891 Unspecified atrial fibrillation: Secondary | ICD-10-CM

## 2012-02-22 MED ORDER — WARFARIN SODIUM 5 MG PO TABS: ORAL_TABLET | ORAL | Status: DC

## 2012-02-29 ENCOUNTER — Ambulatory Visit (INDEPENDENT_AMBULATORY_CARE_PROVIDER_SITE_OTHER): Payer: Medicare Other | Admitting: Pharmacist

## 2012-02-29 DIAGNOSIS — Z7901 Long term (current) use of anticoagulants: Secondary | ICD-10-CM

## 2012-02-29 DIAGNOSIS — I4891 Unspecified atrial fibrillation: Secondary | ICD-10-CM

## 2012-02-29 LAB — POCT INR: INR: 1.8

## 2012-03-20 ENCOUNTER — Telehealth: Payer: Self-pay | Admitting: Internal Medicine

## 2012-03-20 NOTE — Telephone Encounter (Signed)
New Problem:    Patient wanted to know if he was due to have a device clinic appointment.  Please call back.

## 2012-03-20 NOTE — Telephone Encounter (Signed)
Spoke w/pt and advised next check will be in November with [facility].

## 2012-03-24 ENCOUNTER — Ambulatory Visit (INDEPENDENT_AMBULATORY_CARE_PROVIDER_SITE_OTHER): Payer: Medicare Other | Admitting: Pharmacist

## 2012-03-24 DIAGNOSIS — Z7901 Long term (current) use of anticoagulants: Secondary | ICD-10-CM

## 2012-03-24 DIAGNOSIS — I4891 Unspecified atrial fibrillation: Secondary | ICD-10-CM

## 2012-03-24 LAB — POCT INR: INR: 2.1

## 2012-05-01 ENCOUNTER — Ambulatory Visit (INDEPENDENT_AMBULATORY_CARE_PROVIDER_SITE_OTHER): Payer: Medicare Other | Admitting: *Deleted

## 2012-05-01 ENCOUNTER — Other Ambulatory Visit: Payer: Self-pay | Admitting: *Deleted

## 2012-05-01 DIAGNOSIS — I4891 Unspecified atrial fibrillation: Secondary | ICD-10-CM

## 2012-05-01 DIAGNOSIS — Z7901 Long term (current) use of anticoagulants: Secondary | ICD-10-CM

## 2012-05-01 LAB — POCT INR: INR: 3.7

## 2012-05-01 MED ORDER — WARFARIN SODIUM 5 MG PO TABS: ORAL_TABLET | ORAL | Status: DC

## 2012-05-08 ENCOUNTER — Ambulatory Visit (INDEPENDENT_AMBULATORY_CARE_PROVIDER_SITE_OTHER): Payer: Medicare Other | Admitting: Internal Medicine

## 2012-05-08 VITALS — BP 126/78 | HR 76 | Temp 97.6°F | Ht 68.5 in | Wt 208.0 lb

## 2012-05-08 DIAGNOSIS — Z7901 Long term (current) use of anticoagulants: Secondary | ICD-10-CM

## 2012-05-08 DIAGNOSIS — E119 Type 2 diabetes mellitus without complications: Secondary | ICD-10-CM

## 2012-05-08 DIAGNOSIS — I4891 Unspecified atrial fibrillation: Secondary | ICD-10-CM

## 2012-05-08 DIAGNOSIS — N400 Enlarged prostate without lower urinary tract symptoms: Secondary | ICD-10-CM

## 2012-05-08 DIAGNOSIS — Z0000 Encounter for general adult medical examination without abnormal findings: Secondary | ICD-10-CM

## 2012-05-08 DIAGNOSIS — I10 Essential (primary) hypertension: Secondary | ICD-10-CM

## 2012-05-08 DIAGNOSIS — E785 Hyperlipidemia, unspecified: Secondary | ICD-10-CM

## 2012-05-08 LAB — LIPID PANEL
Cholesterol: 158 mg/dL (ref 0–200)
HDL: 51.7 mg/dL (ref >39.00)
LDL Cholesterol: 90 mg/dL (ref 0–99)
Total CHOL/HDL Ratio: 3
Triglycerides: 82 mg/dL (ref 0.0–149.0)
VLDL: 16.4 mg/dL (ref 0.0–40.0)

## 2012-05-08 LAB — HEMOGLOBIN A1C: Hgb A1c MFr Bld: 5.6 % (ref 4.6–6.5)

## 2012-05-08 MED ORDER — ZOLPIDEM TARTRATE 10 MG PO TABS: 10.0000 mg | ORAL_TABLET | Freq: Every evening | ORAL | Status: DC | PRN

## 2012-05-08 MED ORDER — WARFARIN SODIUM 5 MG PO TABS: ORAL_TABLET | ORAL | Status: DC

## 2012-05-08 MED ORDER — CARVEDILOL 25 MG PO TABS: 37.5000 mg | ORAL_TABLET | Freq: Two times a day (BID) | ORAL | Status: DC

## 2012-05-08 MED ORDER — GLUCOSE BLOOD VI STRP: ORAL_STRIP | Status: DC

## 2012-05-08 MED ORDER — FUROSEMIDE 40 MG PO TABS: 60.0000 mg | ORAL_TABLET | Freq: Two times a day (BID) | ORAL | Status: DC

## 2012-05-08 MED ORDER — GLYBURIDE 5 MG PO TABS: 10.0000 mg | ORAL_TABLET | Freq: Two times a day (BID) | ORAL | Status: DC

## 2012-05-08 MED ORDER — SITAGLIPTIN PHOSPHATE 100 MG PO TABS: 100.0000 mg | ORAL_TABLET | Freq: Every day | ORAL | Status: DC

## 2012-05-08 MED ORDER — LOSARTAN POTASSIUM 100 MG PO TABS: 100.0000 mg | ORAL_TABLET | Freq: Every day | ORAL | Status: DC

## 2012-05-08 MED ORDER — PANTOPRAZOLE SODIUM 40 MG PO TBEC: 40.0000 mg | DELAYED_RELEASE_TABLET | ORAL | Status: DC

## 2012-05-08 MED ORDER — NIACIN-LOVASTATIN ER 1000-20 MG PO TB24: 1.0000 | ORAL_TABLET | Freq: Every day | ORAL | Status: DC

## 2012-05-08 NOTE — Assessment & Plan Note (Addendum)
Well-controlled per last hemoglobin A1c, CBGs in the 90s however he does not have any low CBGs symptoms. Plan: A1c, discussed sx of  hypoglycemia with the patient.

## 2012-05-08 NOTE — Progress Notes (Signed)
Subjective:    Patient ID: Bradley [name], male    DOB: Nov 22, 1940, 71 y.o.   MRN: [id number]  HPI Here for Medicare AWV: 1. Risk factors based on Past M, S, F history: reviewed  2. Physical Activities: active, yard and home chores, takes walks sometimes  3. Depression/mood:  no problems reported or noted  4. Hearing: has hearing aids , uses them irregulalrly 5. ADL's: independent  6. Fall Risk: low, prevention discussed  7. Home Safety: does feel safe at home  8. Height, weight, &visual acuity: see VS, uses glasses no problems, plans to see eye doctor  9. Counseling: yes  10. Labs ordered based on risk factors: yes 11.       Referral Coordination, if needed  12.     Care Plan, see a/p  13.    Cognitive Assessment: motor, cognition, memory wnl   in addition, we discussed the following Diabetes,  healthy diet, good medication compliance, ambulatory blood sugars around 94 fasting, slightly higher after eating. No low CBGs sx at all despite the fact that CBGs are  occ < 100 Hypertension, good medication compliance, take his BP very frequently, readings fluctuates, occasionally  up if gets upset ( around 130/79) Insomnia, good medication compliance with Ambien which helped without side effects High cholesterol, good medication compliance with Advicor, does not flush.  Past Medical History:   HYPERTENSION (at some point hard to control, saw Dr Lowell Guitar)   [name]   AODM dx 1999   BENIGN PROSTATIC HYPERTROPHY--saw Dr [name] 2004 , normal renal u/s   INSOMNIA, CHRONIC   CV:  ---ATRIAL FIBRILLATION, PAROXYSMAL (Dr Ladona Ridgel)   ---03-2007.Marland KitchenMarland KitchenMarland KitchenStress test neg, ECHO 55%   ---04-2007: carciac cath w/ no high grade stenosis, + P-HTN   ---CHF   ---s/p AV node ablation & BiV PPM implantation 09/08/11   PULMONARY  ---2008: h/o recurrent loculated right plural effusion, s/p eval Dr [name] , Dr Laneta Simmers. W/U included thoracentesis   --09-2011 aCT of the chest, several lymphadenopathies. Sees  pulmonary   Leg ulcer, dx end of 2011, ABI neg 10-2010, saw vascular (Dr Arbie Cookey 10-2010) -- venous insuff. , conservative treatment    Past Surgical History:  Status post large pneumothorax with fibrothorax.    Status post lung decortication -Dr. Laneta Simmers    Past Surgical History: Reviewed history from 10/22/2010 and no changes required.  Status post large pneumothorax with fibrothorax.   Status post lung decortication -Dr. Laneta Simmers  Status post cardiac catheterization    Family History: Reviewed history from 10/22/2010 and no changes required. MI-- no DM-- F Colon ca-- no prostate ca--no  Social History: Married, 1 son, moved from [place] 2003, lives with his wife.   Tobacco-- quit in the 80s. (used to smoke 2.5 ppd for 25 years) Alcohol-- very rarely No drugs.     Review of Systems Denies chest pain, shortness of breath, cough or wheezing No nausea, vomiting, blood in the stools. No dysuria or difficulty urinating.     Objective:   Physical Exam General -- alert, well-developed. No apparent distress.  Neck --no thromegaly, + carotid pulses  Lungs -- normal respiratory effort, no intercostal retractions, no accessory muscle use, and normal breath sounds.   Heart-- normal rate, regular rhythm, no murmur, and no gallop.   Abdomen--soft, non-tender, no distention, no masses, no HSM, no guarding, and no rigidity.  No bruit. DIABETIC FEET EXAM: No lower extremity edema Normal pedal pulses bilaterally Chronic skin changes and they pretibial area, thick nails. Mild  DJD deformities. Pinprick examination of the feet normal. Neurologic-- alert & oriented X3 and strength normal in all extremities. Psych-- Cognition and judgment appear intact. Alert and cooperative with normal attention span and concentration.  not anxious appearing and not depressed appearing.       Assessment & Plan:

## 2012-05-08 NOTE — Assessment & Plan Note (Signed)
Td 2004 pneumonia shot 2005 , 2010  shingle immunization ---->2012  Cscope: 02-2011 normal  PSAs have been consistently normal, last year DRE normal as well. Reassess need for prostate cancer screening next year.  Diet discussed

## 2012-05-08 NOTE — Assessment & Plan Note (Signed)
Well controlled, last BMP normal. No change 

## 2012-05-08 NOTE — Assessment & Plan Note (Signed)
Respiratory status currently stable Abnormal CT of the chest, already evaluated by pulmonary, was recommended no further testing.

## 2012-05-08 NOTE — Assessment & Plan Note (Signed)
Good compliance with medicines, check FLP

## 2012-05-23 ENCOUNTER — Ambulatory Visit (INDEPENDENT_AMBULATORY_CARE_PROVIDER_SITE_OTHER): Payer: Medicare Other | Admitting: Pharmacist

## 2012-05-23 DIAGNOSIS — I4891 Unspecified atrial fibrillation: Secondary | ICD-10-CM

## 2012-05-23 DIAGNOSIS — Z7901 Long term (current) use of anticoagulants: Secondary | ICD-10-CM

## 2012-05-23 LAB — POCT INR: INR: 2.4

## 2012-06-11 DIAGNOSIS — I629 Nontraumatic intracranial hemorrhage, unspecified: Secondary | ICD-10-CM

## 2012-06-11 HISTORY — DX: Nontraumatic intracranial hemorrhage, unspecified: I62.9

## 2012-06-20 ENCOUNTER — Ambulatory Visit (INDEPENDENT_AMBULATORY_CARE_PROVIDER_SITE_OTHER): Payer: Medicare Other

## 2012-06-20 DIAGNOSIS — I4891 Unspecified atrial fibrillation: Secondary | ICD-10-CM

## 2012-06-20 DIAGNOSIS — Z7901 Long term (current) use of anticoagulants: Secondary | ICD-10-CM

## 2012-06-20 LAB — POCT INR: INR: 3.7

## 2012-07-01 ENCOUNTER — Emergency Department (HOSPITAL_BASED_OUTPATIENT_CLINIC_OR_DEPARTMENT_OTHER): Payer: Medicare Other

## 2012-07-01 ENCOUNTER — Encounter (HOSPITAL_BASED_OUTPATIENT_CLINIC_OR_DEPARTMENT_OTHER): Payer: Self-pay | Admitting: *Deleted

## 2012-07-01 ENCOUNTER — Inpatient Hospital Stay (HOSPITAL_COMMUNITY)
Admission: EM | Admit: 2012-07-01 | Discharge: 2012-07-04 | Disposition: A | Discharge status: Home / Self Care | Payer: Medicare Other | Source: Home / Self Care | Attending: Neurology | Admitting: Neurology

## 2012-07-01 DIAGNOSIS — I4891 Unspecified atrial fibrillation: Secondary | ICD-10-CM

## 2012-07-01 DIAGNOSIS — F329 Major depressive disorder, single episode, unspecified: Secondary | ICD-10-CM | POA: Diagnosis present

## 2012-07-01 DIAGNOSIS — T45515A Adverse effect of anticoagulants, initial encounter: Secondary | ICD-10-CM | POA: Diagnosis present

## 2012-07-01 DIAGNOSIS — IMO0002 Reserved for concepts with insufficient information to code with codable children: Secondary | ICD-10-CM

## 2012-07-01 DIAGNOSIS — G936 Cerebral edema: Secondary | ICD-10-CM | POA: Diagnosis present

## 2012-07-01 DIAGNOSIS — G8190 Hemiplegia, unspecified affecting unspecified side: Secondary | ICD-10-CM | POA: Diagnosis present

## 2012-07-01 DIAGNOSIS — Z7901 Long term (current) use of anticoagulants: Secondary | ICD-10-CM

## 2012-07-01 DIAGNOSIS — I428 Other cardiomyopathies: Secondary | ICD-10-CM | POA: Diagnosis present

## 2012-07-01 DIAGNOSIS — Z7982 Long term (current) use of aspirin: Secondary | ICD-10-CM

## 2012-07-01 DIAGNOSIS — I619 Nontraumatic intracerebral hemorrhage, unspecified: Secondary | ICD-10-CM | POA: Diagnosis present

## 2012-07-01 DIAGNOSIS — R5381 Other malaise: Secondary | ICD-10-CM | POA: Diagnosis present

## 2012-07-01 DIAGNOSIS — E669 Obesity, unspecified: Secondary | ICD-10-CM | POA: Diagnosis present

## 2012-07-01 DIAGNOSIS — R5383 Other fatigue: Secondary | ICD-10-CM | POA: Diagnosis present

## 2012-07-01 DIAGNOSIS — E119 Type 2 diabetes mellitus without complications: Secondary | ICD-10-CM | POA: Diagnosis present

## 2012-07-01 DIAGNOSIS — N400 Enlarged prostate without lower urinary tract symptoms: Secondary | ICD-10-CM | POA: Diagnosis present

## 2012-07-01 DIAGNOSIS — F411 Generalized anxiety disorder: Secondary | ICD-10-CM | POA: Diagnosis present

## 2012-07-01 DIAGNOSIS — Z8249 Family history of ischemic heart disease and other diseases of the circulatory system: Secondary | ICD-10-CM

## 2012-07-01 DIAGNOSIS — I615 Nontraumatic intracerebral hemorrhage, intraventricular: Secondary | ICD-10-CM | POA: Diagnosis present

## 2012-07-01 DIAGNOSIS — G9349 Other encephalopathy: Secondary | ICD-10-CM | POA: Diagnosis present

## 2012-07-01 DIAGNOSIS — I10 Essential (primary) hypertension: Secondary | ICD-10-CM | POA: Diagnosis present

## 2012-07-01 DIAGNOSIS — Z79899 Other long term (current) drug therapy: Secondary | ICD-10-CM

## 2012-07-01 DIAGNOSIS — I629 Nontraumatic intracranial hemorrhage, unspecified: Secondary | ICD-10-CM

## 2012-07-01 DIAGNOSIS — Z87891 Personal history of nicotine dependence: Secondary | ICD-10-CM

## 2012-07-01 DIAGNOSIS — E785 Hyperlipidemia, unspecified: Secondary | ICD-10-CM | POA: Diagnosis present

## 2012-07-01 DIAGNOSIS — Z833 Family history of diabetes mellitus: Secondary | ICD-10-CM

## 2012-07-01 DIAGNOSIS — I509 Heart failure, unspecified: Secondary | ICD-10-CM | POA: Diagnosis present

## 2012-07-01 DIAGNOSIS — G4700 Insomnia, unspecified: Secondary | ICD-10-CM | POA: Diagnosis present

## 2012-07-01 DIAGNOSIS — F3289 Other specified depressive episodes: Secondary | ICD-10-CM | POA: Diagnosis present

## 2012-07-01 DIAGNOSIS — Z950 Presence of cardiac pacemaker: Secondary | ICD-10-CM

## 2012-07-01 LAB — URINALYSIS, ROUTINE W REFLEX MICROSCOPIC
Bilirubin Urine: NEGATIVE
Glucose, UA: NEGATIVE mg/dL
Hgb urine dipstick: NEGATIVE
Ketones, ur: NEGATIVE mg/dL
Leukocytes, UA: NEGATIVE
Nitrite: NEGATIVE
Protein, ur: NEGATIVE mg/dL
Specific Gravity, Urine: 1.02 (ref 1.005–1.030)
Urobilinogen, UA: 1 mg/dL (ref 0.0–1.0)
pH: 6.5 (ref 5.0–8.0)

## 2012-07-01 LAB — CBC WITH DIFFERENTIAL/PLATELET
Basophils Absolute: 0 10*3/uL (ref 0.0–0.1)
Basophils Relative: 0 % (ref 0–1)
Eosinophils Absolute: 0 10*3/uL (ref 0.0–0.7)
Eosinophils Relative: 0 % (ref 0–5)
HCT: 43.1 % (ref 39.0–52.0)
Hemoglobin: 15.7 g/dL (ref 13.0–17.0)
Lymphocytes Relative: 15 % (ref 12–46)
Lymphs Abs: 1.3 10*3/uL (ref 0.7–4.0)
MCH: 31.5 pg (ref 26.0–34.0)
MCHC: 36.4 g/dL — ABNORMAL HIGH (ref 30.0–36.0)
MCV: 86.4 fL (ref 78.0–100.0)
Monocytes Absolute: 1 10*3/uL (ref 0.1–1.0)
Monocytes Relative: 11 % (ref 3–12)
Neutro Abs: 6.2 10*3/uL (ref 1.7–7.7)
Neutrophils Relative %: 73 % (ref 43–77)
Platelets: 209 10*3/uL (ref 150–400)
RBC: 4.99 MIL/uL (ref 4.22–5.81)
RDW: 12.9 % (ref 11.5–15.5)
WBC: 8.5 10*3/uL (ref 4.0–10.5)

## 2012-07-01 LAB — COMPREHENSIVE METABOLIC PANEL
ALT: 15 U/L (ref 0–53)
AST: 22 U/L (ref 0–37)
Albumin: 3.8 g/dL (ref 3.5–5.2)
Alkaline Phosphatase: 66 U/L (ref 39–117)
BUN: 13 mg/dL (ref 6–23)
CO2: 26 mEq/L (ref 19–32)
Calcium: 9 mg/dL (ref 8.4–10.5)
Chloride: 99 mEq/L (ref 96–112)
Creatinine, Ser: 0.7 mg/dL (ref 0.50–1.35)
GFR calc Af Amer: >90 mL/min (ref >90)
GFR calc non Af Amer: >90 mL/min (ref >90)
Glucose, Bld: 132 mg/dL — ABNORMAL HIGH (ref 70–99)
Potassium: 4.1 mEq/L (ref 3.5–5.1)
Sodium: 136 mEq/L (ref 135–145)
Total Bilirubin: 0.8 mg/dL (ref 0.3–1.2)
Total Protein: 7.6 g/dL (ref 6.0–8.3)

## 2012-07-01 LAB — PROTIME-INR
INR: 1.28 (ref 0.00–1.49)
Prothrombin Time: 15.7 seconds — ABNORMAL HIGH (ref 11.6–15.2)

## 2012-07-01 LAB — PRO B NATRIURETIC PEPTIDE: Pro B Natriuretic peptide (BNP): 1377 pg/mL — ABNORMAL HIGH (ref 0–125)

## 2012-07-01 LAB — TROPONIN I: Troponin I: <0.3 ng/mL (ref <0.30)

## 2012-07-01 LAB — GLUCOSE, CAPILLARY: Glucose-Capillary: 150 mg/dL — ABNORMAL HIGH (ref 70–99)

## 2012-07-01 MED ORDER — SENNOSIDES-DOCUSATE SODIUM 8.6-50 MG PO TABS
1.0000 | ORAL_TABLET | Freq: Two times a day (BID) | ORAL | Status: DC
  Administered 2012-07-01 – 2012-07-04 (×4): 1 via ORAL
  Filled 2012-07-01 (×5): qty 1

## 2012-07-01 MED ORDER — METOCLOPRAMIDE HCL 5 MG/ML IJ SOLN: 10.0000 mg | Freq: Once | INTRAMUSCULAR | Status: DC

## 2012-07-01 MED ORDER — ACETAMINOPHEN 325 MG PO TABS
650.0000 mg | ORAL_TABLET | ORAL | Status: DC | PRN
  Administered 2012-07-02 – 2012-07-04 (×6): 650 mg via ORAL
  Filled 2012-07-01 (×6): qty 2

## 2012-07-01 MED ORDER — GLYBURIDE 5 MG PO TABS
10.0000 mg | ORAL_TABLET | Freq: Two times a day (BID) | ORAL | Status: DC
  Administered 2012-07-02 – 2012-07-04 (×5): 10 mg via ORAL
  Filled 2012-07-01 (×7): qty 2

## 2012-07-01 MED ORDER — NIACIN ER 500 MG PO CPCR
1000.0000 mg | ORAL_CAPSULE | Freq: Every day | ORAL | Status: DC
  Administered 2012-07-01 – 2012-07-03 (×3): 1000 mg via ORAL
  Filled 2012-07-01 (×4): qty 2

## 2012-07-01 MED ORDER — FUROSEMIDE 40 MG PO TABS
60.0000 mg | ORAL_TABLET | Freq: Two times a day (BID) | ORAL | Status: DC
  Administered 2012-07-02 – 2012-07-04 (×5): 60 mg via ORAL
  Filled 2012-07-01 (×7): qty 1

## 2012-07-01 MED ORDER — FENTANYL CITRATE 0.05 MG/ML IJ SOLN
50.0000 ug | Freq: Once | INTRAMUSCULAR | Status: AC
  Administered 2012-07-01: 50 ug via INTRAVENOUS
  Filled 2012-07-01: qty 2

## 2012-07-01 MED ORDER — NIACIN-LOVASTATIN ER 1000-20 MG PO TB24: 1.0000 | ORAL_TABLET | Freq: Every day | ORAL | Status: DC

## 2012-07-01 MED ORDER — PANTOPRAZOLE SODIUM 40 MG IV SOLR
40.0000 mg | Freq: Every day | INTRAVENOUS | Status: DC
  Administered 2012-07-01 – 2012-07-02 (×2): 40 mg via INTRAVENOUS
  Filled 2012-07-01 (×3): qty 40

## 2012-07-01 MED ORDER — BIOTENE DRY MOUTH MT LIQD
15.0000 mL | Freq: Two times a day (BID) | OROMUCOSAL | Status: DC
  Administered 2012-07-02: 15 mL via OROMUCOSAL

## 2012-07-01 MED ORDER — CARVEDILOL 25 MG PO TABS
37.5000 mg | ORAL_TABLET | Freq: Two times a day (BID) | ORAL | Status: DC
  Administered 2012-07-02 – 2012-07-04 (×5): 37.5 mg via ORAL
  Filled 2012-07-01 (×7): qty 1

## 2012-07-01 MED ORDER — SIMVASTATIN 10 MG PO TABS
10.0000 mg | ORAL_TABLET | Freq: Every day | ORAL | Status: DC
  Administered 2012-07-01 – 2012-07-03 (×3): 10 mg via ORAL
  Filled 2012-07-01 (×4): qty 1

## 2012-07-01 MED ORDER — ACETAMINOPHEN 650 MG RE SUPP: 650.0000 mg | RECTAL | Status: DC | PRN

## 2012-07-01 MED ORDER — PANTOPRAZOLE SODIUM 40 MG PO TBEC: 40.0000 mg | DELAYED_RELEASE_TABLET | ORAL | Status: DC

## 2012-07-01 MED ORDER — LOSARTAN POTASSIUM 50 MG PO TABS
100.0000 mg | ORAL_TABLET | Freq: Every day | ORAL | Status: DC
  Administered 2012-07-02 – 2012-07-04 (×3): 100 mg via ORAL
  Filled 2012-07-01 (×3): qty 2

## 2012-07-01 MED ORDER — ONDANSETRON HCL 4 MG/2ML IJ SOLN: 4.0000 mg | Freq: Four times a day (QID) | INTRAMUSCULAR | Status: DC | PRN

## 2012-07-01 MED ORDER — LABETALOL HCL 5 MG/ML IV SOLN
10.0000 mg | INTRAVENOUS | Status: DC | PRN
  Administered 2012-07-02 – 2012-07-03 (×2): 10 mg via INTRAVENOUS
  Administered 2012-07-03: 20 mg via INTRAVENOUS
  Filled 2012-07-01 (×3): qty 4

## 2012-07-01 MED ORDER — LINAGLIPTIN 5 MG PO TABS
5.0000 mg | ORAL_TABLET | Freq: Every day | ORAL | Status: DC
  Administered 2012-07-02 – 2012-07-04 (×3): 5 mg via ORAL
  Filled 2012-07-01 (×3): qty 1

## 2012-07-01 NOTE — H&P (Signed)
Admission H&P    Chief Complaint: Headaches HPI: Bradley [name] is an 71 y.o. male who first noticed mild right sided weakness approximately three weeks ago. Several days later, he began having headaches that have been persistent since that time. The persistent headaches prompted him to seek medical attention today. In the ER, he had a head CT showing a right temporal hematoma.   He states that he currently feels at his baseline other than a mild right weakness that he only notices in the morning.   He currently feels that his breathing is good and is in no distress from this angle.   LSN: three weeks ago tPA Given: No: IPH  Past Medical History  Diagnosis Date  . HTN (hypertension)   . HLD (hyperlipidemia)   . DM (diabetes mellitus) 1999    adult onset  . BPH (benign prostatic hyperplasia)     Saw Dr [name] 2004, normal renal u/s  . Leg ulcer 2011  . CHF (congestive heart failure)     Thought primarily to be non-systolic although EF down (EF 16-10% 12/2010, down to 35-40% 09/05/11), cath 2008 with no CAD, nuclear study 07/2011 showing Small area of reversibility in the distal ant/lat wall the left ventricle suspicious for ischemia/septal wall HK but felt to be low risk  (per D/C Summary 07/2011)  . Pleural effusion 2008    S/p decortication  . Insomnia   . Atrial fibrillation     s/p AV node ablation & BiV PPM implantation 09/08/11 (op dictation pending)  . Sinoatrial node dysfunction   . Headache   . Pulmonary HTN     Past Surgical History  Procedure Date  . Pleural scarification   . Lung decortication   . Colonoscopy 03/10/11    normal  . Pneumothorax with fibrothorax   . Insert / replace / remove pacemaker 09/08/11    pacemaker placement    Family History  Problem Relation Age of Onset  . Diabetes Father   . Breast cancer Maternal Aunt   . Heart attack Neg Hx   . Prostate cancer Neg Hx   . Colon cancer Neg Hx   . Cancer Neg Hx     colon or prostate  . Coronary  artery disease Father   . Polycythemia Mother    Social History:  reports that he quit smoking about 31 years ago. His smoking use included Cigarettes. He has a 62.5 pack-year smoking history. He has never used smokeless tobacco. He reports that he drinks alcohol. He reports that he does not use illicit drugs.  Allergies:  Allergies  Allergen Reactions  . Avelox (Moxifloxacin Hydrochloride) Itching    Headache  . Tadalafil     REACTION: HA, backache    Medications Prior to Admission  Medication Sig Dispense Refill  . aspirin 81 MG chewable tablet Chew 81 mg by mouth daily.        . carvedilol (COREG) 25 MG tablet Take 1.5 tablets (37.5 mg total) by mouth 2 (two) times daily with a meal.  270 tablet  3  . dextromethorphan-guaiFENesin (MUCINEX DM) 30-600 MG per 12 hr tablet Take 1 tablet by mouth as needed. For congestion      . fluticasone (FLONASE) 50 MCG/ACT nasal spray Place 1 spray into the nose daily.       . furosemide (LASIX) 40 MG tablet Take 1.5 tablets (60 mg total) by mouth 2 (two) times daily.  270 tablet  3  . glucose blood (ONE TOUCH  ULTRA TEST) test strip Use as instructed  100 each  12  . glyBURIDE (DIABETA) 5 MG tablet Take 2 tablets (10 mg total) by mouth 2 (two) times daily with a meal.  360 tablet  3  . losartan (COZAAR) 100 MG tablet Take 1 tablet (100 mg total) by mouth daily.  90 tablet  3  . Multiple Vitamins-Minerals (MULTIVITAMINS THER. W/MINERALS) TABS Take 1 tablet by mouth daily.        . niacin-lovastatin (ADVICOR) 1000-20 MG 24 hr tablet Take 1 tablet by mouth at bedtime.  90 tablet  3  . NON FORMULARY Place into the nose daily. 2 liters of oxygen      . pantoprazole (PROTONIX) 40 MG tablet Take 1 tablet (40 mg total) by mouth every morning.  90 tablet  3  . pantoprazole (PROTONIX) 40 MG tablet Take 40 mg by mouth daily as needed.      . sitaGLIPtin (JANUVIA) 100 MG tablet Take 1 tablet (100 mg total) by mouth daily.  90 tablet  3  . warfarin (COUMADIN) 5  MG tablet Take 1 tablet by mouth daily as directed by [facility].  90 tablet  3  . zolpidem (AMBIEN) 10 MG tablet Take 1 tablet (10 mg total) by mouth at bedtime as needed. For sleep  90 tablet  1    ROS: 11 point ROS was performed and is negative except as noted in the HPI.   Physical Examination: Blood pressure 160/80, pulse 84, temperature 98.7 F (37.1 C), temperature source Oral, resp. rate 16, height 5\' 9"  (1.753 m), weight 95.2 kg (209 lb 14.1 oz), SpO2 97.00%.  General Examination: HEENT-  Normocephalic, no lesions, without obvious abnormality. Normal external nose, mucus membranes and septum.  Normal pharynx. Neck supple with no masses, nodes, nodules or enlargement. Cardiovascular - Paced rhythm, normal rate.  Lungs - Non-labored breathing.  Abdomen - Soft, NT, ND Extremities - Significant venostasis changes bilaterally, no edema.   Neurologic Examination: Mental Status: Patient is awake, alert, oriented to person, place, month, year, and situation. Immediate and remote memory are intact. He has difficulty with serial 7s and spelling world backwards.  Patient is able to give a clear and coherent history. Cranial Nerves: II: Visual Fields are full. Pupils are equal, round, and reactive to light.  Discs are sharp. III,IV, VI: EOMI without ptosis or diploplia.  V,VII: Facial sensation and movement are symmetric.  VIII: hearing is intact to voice X: Uvula elevates symmetrically XI: Shoulder shrug is symmetric. XII: tongue is midline without atrophy or fasciculations.  Motor: Tone is normal. Bulk is normal. 5/5 strength was present in all four extremities.  He does not have any pronator drift.  Sensory: Sensation is symmetric to light touch and temperature in the arms and legs. He has a distal decrease to vibration at the toes and ankles, decrease to temp to just below the knee.  Deep Tendon Reflexes: 2+ and symmetric in the biceps and patellae.   Plantars: Toes are downgoing bilaterally.  Cerebellar: FNF and HKS are intact bilaterally Gait: Did not test due to ICU monitoring and danger to patient of ambulating with acute hemorrhage  Laboratory Studies:   Basic Metabolic Panel:  Lab 07/01/12 1610  NA 136  K 4.1  CL 99  CO2 26  GLUCOSE 132*  BUN 13  CREATININE 0.70  CALCIUM 9.0  MG --  PHOS --    Liver Function Tests:  Lab 07/01/12 1445  AST 22  ALT 15  ALKPHOS 66  BILITOT 0.8  PROT 7.6  ALBUMIN 3.8   No results found for this basename: LIPASE:5,AMYLASE:5 in the last 168 hours No results found for this basename: AMMONIA:3 in the last 168 hours  CBC:  Lab 07/01/12 1445  WBC 8.5  NEUTROABS 6.2  HGB 15.7  HCT 43.1  MCV 86.4  PLT 209    Cardiac Enzymes:  Lab 07/01/12 1445  CKTOTAL --  CKMB --  CKMBINDEX --  TROPONINI <0.30    BNP: No components found with this basename: POCBNP:5  CBG: No results found for this basename: GLUCAP:5 in the last 168 hours  Microbiology: Results for orders placed during the hospital encounter of 07/27/11  CULTURE, BLOOD (ROUTINE X 2)     Status: Normal   Collection Time   07/27/11  9:55 PM      Component Value Range Status Comment   Specimen Description BLOOD RIGHT ARM   Final    Special Requests     Final    Value: BOTTLES DRAWN AEROBIC AND ANAEROBIC 8CC AER 5CC ANA   Culture  Setup Time [id number]   Final    Culture NO GROWTH 5 DAYS   Final    Report Status 08/03/2011 FINAL   Final   CULTURE, BLOOD (ROUTINE X 2)     Status: Normal   Collection Time   07/27/11 10:00 PM      Component Value Range Status Comment   Specimen Description BLOOD RIGHT HAND   Final    Special Requests BOTTLES DRAWN AEROBIC ONLY 5CC   Final    Culture  Setup Time [id number]   Final    Culture NO GROWTH 5 DAYS   Final    Report Status 08/03/2011 FINAL   Final     Coagulation Studies:  Basename 07/01/12 1445  LABPROT 15.7*  INR 1.28    Urinalysis: No results found  for this basename: COLORURINE:2,APPERANCEUR:2,LABSPEC:2,PHURINE:2,GLUCOSEU:2,HGBUR:2,BILIRUBINUR:2,KETONESUR:2,PROTEINUR:2,UROBILINOGEN:2,NITRITE:2,LEUKOCYTESUR:2 in the last 168 hours  Lipid Panel:     Component Value Date/Time   CHOL 158 05/08/2012 0917   TRIG 82.0 05/08/2012 0917   HDL 51.70 05/08/2012 0917   CHOLHDL 3 05/08/2012 0917   VLDL 16.4 05/08/2012 0917   LDLCALC 90 05/08/2012 0917    HgbA1C:  Lab Results  Component Value Date   HGBA1C 5.6 05/08/2012    Urine Drug Screen:   No results found for this basename: labopia, cocainscrnur, labbenz, amphetmu, thcu, labbarb    Alcohol Level: No results found for this basename: ETH:2 in the last 168 hours  Other results:  Imaging: Dg Chest 2 View  07/01/2012  *RADIOLOGY REPORT*  Clinical Data: Chest pain  CHEST - 2 VIEW  Comparison: 10/28/2011  Findings: Cardiomegaly again noted.  No acute infiltrate or pulmonary edema.  Stable chronic blunting of the costophrenic angles.  Dual lead cardiac pacemaker is unchanged in position.  IMPRESSION: No acute infiltrate or pulmonary edema.  Stable chronic blunting of the costophrenic angles.  Dual lead cardiac pacemaker is unchanged in position.   Original Report Authenticated By: Natasha Mead, M.D.    Dg Abd 1 View  07/01/2012  *RADIOLOGY REPORT*  Clinical Data: Abdominal pain  ABDOMEN - 1 VIEW  Comparison: None.  Findings: There is nonspecific nonobstructive bowel gas pattern. Moderate stool noted in the right colon.  No free abdominal air.  IMPRESSION: Nonspecific nonobstructive bowel gas pattern.  Moderate stool noted in the right colon.   Original Report Authenticated By: Natasha Mead, M.D.  Ct Head Wo Contrast  07/01/2012  *RADIOLOGY REPORT*  Clinical Data: Headache for several days.  Diabetic hypertensive.  CT HEAD WITHOUT CONTRAST  Technique:  Contiguous axial images were obtained from the base of the skull through the vertex without contrast.  Comparison: 03/29/2009.  Findings: Right mid to  anterior temporal horn hematoma with surrounding vasogenic edema.  Etiology indeterminate.  This may be related to a hypertensive hemorrhage, congophillic/amyloid angiopathy or result of underlying mass/vascular malformation which is not detected on the present examination or the prior unenhanced head CT.  The patient would benefit from follow-up until this clears to evaluate for possible underlying lesion.  Small amount of blood is seen in the dependent aspect of the occipital horns bilaterally.  The ventricular system is slightly more prominent than on prior examination which may be related atrophy rather hydrocephalus and can be accessed on follow-up.  Prominent small vessel disease type changes.  Remote left basal ganglia infarct.  Opacification left maxillary sinus with air fluid level.  IMPRESSION: Right temporal horn hematoma as detailed above with small amount of dependent blood within the occipital horn of the ventricles bilaterally.  Prominent small vessel disease type changes without CT evidence of large acute thrombotic infarct.  Critical Value/emergent results were called by telephone at the time of interpretation on 07/01/2012 and at 4:09 p.m. to Dr. Silverio Lay, who verbally acknowledged these results.   Original Report Authenticated By: Fuller Canada, M.D.     Assessment: 71 y.o. male with 3 weeks of mild right hemiparesis and headaches. I suspect that he had a small ischemic stroke but subsequently became hemorrhagic in the setting of his Coumadin use, however is difficult to determine. He is currently running in the 170s systolic despite being on all of his antihypertensives, and so I will not change this regimen but will use labetalol in addition to control him less than 160.  His INR is not elevated.   He also has signs of a peripheral neuroapthy, will order B12.     Stroke Risk Factors - atrial fibrillation, diabetes mellitus and hyperlipidemia, htn  Plan: 1. HgbA1c, fasting lipid  panel 2. PT consult, OT consult, Speech consult 3. Echocardiogram 4. Carotid dopplers 5. Prophylactic therapy-None 6. Risk factor modification 7. Telemetry monitoring 8. Frequent neuro checks 9. Repeat CT in AM to ensure ti is stable.  10. B12   Ritta Slot, MD [facility] [phone]  07/01/2012, 7:30 PM

## 2012-07-01 NOTE — Consult Note (Signed)
Reason for Consult: Intracerebral hemorrhage right temporal  Referring Physician: Dr. Noel Christmas  Bradley [name] is an 71 y.o. male.  HPI: Patient is a 71 year old right-handed white male who notes that he is had the onset of headache over the past several days time for headache has gotten consistently worse he does not notice any change in his strength and his mentation other than the singular complaint that he has significant headache I CT of the head was performed which demonstrated a presence of a right temporal intracerebral hemorrhage which extends somewhat into the ventricle but does not cause any shift or mass effect. The patient is transferred to [facility] for further acute care.  Past Medical History  Diagnosis Date  . HTN (hypertension)   . HLD (hyperlipidemia)   . DM (diabetes mellitus) 1999    adult onset  . BPH (benign prostatic hyperplasia)     Saw Dr [name] 2004, normal renal u/s  . Leg ulcer 2011  . CHF (congestive heart failure)     Thought primarily to be non-systolic although EF down (EF 16-10% 12/2010, down to 35-40% 09/05/11), cath 2008 with no CAD, nuclear study 07/2011 showing Small area of reversibility in the distal ant/lat wall the left ventricle suspicious for ischemia/septal wall HK but felt to be low risk  (per D/C Summary 07/2011)  . Pleural effusion 2008    S/p decortication  . Insomnia   . Atrial fibrillation     s/p AV node ablation & BiV PPM implantation 09/08/11 (op dictation pending)  . Sinoatrial node dysfunction   . Headache   . Pulmonary HTN     Past Surgical History  Procedure Date  . Pleural scarification   . Lung decortication   . Colonoscopy 03/10/11    normal  . Pneumothorax with fibrothorax   . Insert / replace / remove pacemaker 09/08/11    pacemaker placement    Family History  Problem Relation Age of Onset  . Diabetes Father   . Breast cancer Maternal Aunt   . Heart attack Neg Hx   . Prostate cancer Neg Hx   .  Colon cancer Neg Hx   . Cancer Neg Hx     colon or prostate  . Coronary artery disease Father   . Polycythemia Mother     Social History:  reports that he quit smoking about 31 years ago. His smoking use included Cigarettes. He has a 62.5 pack-year smoking history. He has never used smokeless tobacco. He reports that he drinks alcohol. He reports that he does not use illicit drugs.  Allergies:  Allergies  Allergen Reactions  . Avelox (Moxifloxacin Hydrochloride) Itching    Headache  . Tadalafil     REACTION: HA, backache    Medications: Have not reviewed the patient's medication  Results for orders placed during the hospital encounter of 07/01/12 (from the past 48 hour(s))  CBC WITH DIFFERENTIAL     Status: Abnormal   Collection Time   07/01/12  2:45 PM      Component Value Range Comment   WBC 8.5  4.0 - 10.5 K/uL    RBC 4.99  4.22 - 5.81 MIL/uL    Hemoglobin 15.7  13.0 - 17.0 g/dL    HCT 96.0  45.4 - 09.8 %    MCV 86.4  78.0 - 100.0 fL    MCH 31.5  26.0 - 34.0 pg    MCHC 36.4 (*) 30.0 - 36.0 g/dL    RDW 12.9  11.5 - 15.5 %    Platelets 209  150 - 400 K/uL    Neutrophils Relative 73  43 - 77 %    Neutro Abs 6.2  1.7 - 7.7 K/uL    Lymphocytes Relative 15  12 - 46 %    Lymphs Abs 1.3  0.7 - 4.0 K/uL    Monocytes Relative 11  3 - 12 %    Monocytes Absolute 1.0  0.1 - 1.0 K/uL    Eosinophils Relative 0  0 - 5 %    Eosinophils Absolute 0.0  0.0 - 0.7 K/uL    Basophils Relative 0  0 - 1 %    Basophils Absolute 0.0  0.0 - 0.1 K/uL   COMPREHENSIVE METABOLIC PANEL     Status: Abnormal   Collection Time   07/01/12  2:45 PM      Component Value Range Comment   Sodium 136  135 - 145 mEq/L    Potassium 4.1  3.5 - 5.1 mEq/L    Chloride 99  96 - 112 mEq/L    CO2 26  19 - 32 mEq/L    Glucose, Bld 132 (*) 70 - 99 mg/dL    BUN 13  6 - 23 mg/dL    Creatinine, Ser 9.56  0.50 - 1.35 mg/dL    Calcium 9.0  8.4 - 21.3 mg/dL    Total Protein 7.6  6.0 - 8.3 g/dL    Albumin 3.8  3.5 - 5.2  g/dL    AST 22  0 - 37 U/L    ALT 15  0 - 53 U/L    Alkaline Phosphatase 66  39 - 117 U/L    Total Bilirubin 0.8  0.3 - 1.2 mg/dL    GFR calc non Af Amer >90  >90 mL/min    GFR calc Af Amer >90  >90 mL/min   PRO B NATRIURETIC PEPTIDE     Status: Abnormal   Collection Time   07/01/12  2:45 PM      Component Value Range Comment   Pro B Natriuretic peptide (BNP) 1377.0 (*) 0 - 125 pg/mL   TROPONIN I     Status: Normal   Collection Time   07/01/12  2:45 PM      Component Value Range Comment   Troponin I <0.30  <0.30 ng/mL   PROTIME-INR     Status: Abnormal   Collection Time   07/01/12  2:45 PM      Component Value Range Comment   Prothrombin Time 15.7 (*) 11.6 - 15.2 seconds    INR 1.28  0.00 - 1.49     Dg Chest 2 View  07/01/2012  *RADIOLOGY REPORT*  Clinical Data: Chest pain  CHEST - 2 VIEW  Comparison: 10/28/2011  Findings: Cardiomegaly again noted.  No acute infiltrate or pulmonary edema.  Stable chronic blunting of the costophrenic angles.  Dual lead cardiac pacemaker is unchanged in position.  IMPRESSION: No acute infiltrate or pulmonary edema.  Stable chronic blunting of the costophrenic angles.  Dual lead cardiac pacemaker is unchanged in position.   Original Report Authenticated By: Natasha Mead, M.D.    Dg Abd 1 View  07/01/2012  *RADIOLOGY REPORT*  Clinical Data: Abdominal pain  ABDOMEN - 1 VIEW  Comparison: None.  Findings: There is nonspecific nonobstructive bowel gas pattern. Moderate stool noted in the right colon.  No free abdominal air.  IMPRESSION: Nonspecific nonobstructive bowel gas pattern.  Moderate stool noted in the right colon.  Original Report Authenticated By: Natasha Mead, M.D.    Ct Head Wo Contrast  07/01/2012  *RADIOLOGY REPORT*  Clinical Data: Headache for several days.  Diabetic hypertensive.  CT HEAD WITHOUT CONTRAST  Technique:  Contiguous axial images were obtained from the base of the skull through the vertex without contrast.  Comparison: 03/29/2009.   Findings: Right mid to anterior temporal horn hematoma with surrounding vasogenic edema.  Etiology indeterminate.  This may be related to a hypertensive hemorrhage, congophillic/amyloid angiopathy or result of underlying mass/vascular malformation which is not detected on the present examination or the prior unenhanced head CT.  The patient would benefit from follow-up until this clears to evaluate for possible underlying lesion.  Small amount of blood is seen in the dependent aspect of the occipital horns bilaterally.  The ventricular system is slightly more prominent than on prior examination which may be related atrophy rather hydrocephalus and can be accessed on follow-up.  Prominent small vessel disease type changes.  Remote left basal ganglia infarct.  Opacification left maxillary sinus with air fluid level.  IMPRESSION: Right temporal horn hematoma as detailed above with small amount of dependent blood within the occipital horn of the ventricles bilaterally.  Prominent small vessel disease type changes without CT evidence of large acute thrombotic infarct.  Critical Value/emergent results were called by telephone at the time of interpretation on 07/01/2012 and at 4:09 p.m. to Dr. Silverio Lay, who verbally acknowledged these results.   Original Report Authenticated By: Fuller Canada, M.D.     Review of Systems  Constitutional: Negative.   Eyes: Negative.   Respiratory: Negative.   Cardiovascular: Negative.   Gastrointestinal: Negative.   Genitourinary: Negative.   Musculoskeletal: Negative.   Skin: Negative.   Neurological: Positive for dizziness and headaches.       Patient's sole complaint is that of headache denies any other problems such as focal weakness speech changes sensory changes ringing in the ears double vision.  Endo/Heme/Allergies: Negative.   Psychiatric/Behavioral: Negative.    Blood pressure 160/80, pulse 84, resp. rate 16, height 5\' 9"  (1.753 m), weight 95.2 kg (209 lb 14.1 oz),  SpO2 97.00%. Physical Exam  Constitutional: He is oriented to person, place, and time. He appears well-developed and well-nourished.  Eyes: Conjunctivae normal are normal. Pupils are equal, round, and reactive to light.  Neck: Normal range of motion. Neck supple.  Cardiovascular: Normal rate and regular rhythm.   Respiratory: Effort normal and breath sounds normal.  GI: Soft. Bowel sounds are normal.  Neurological: He is alert and oriented to person, place, and time. He has normal reflexes.       No evidence of an upper lower extremity drift. Speech is normal in flow and  Skin: Skin is warm and dry.  Psychiatric: He has a normal mood and affect. His behavior is normal. Judgment and thought content normal.    Assessment/Plan: Right temporal hematoma on medial temporal lobe without significant mass effect or any neural dysfunction.  I believe this intracerebral hemorrhage will likely resolve on its own without any surgical intervention. Please reconsult if necessary  Parish Augustine J 07/01/2012, 7:09 PM

## 2012-07-01 NOTE — ED Provider Notes (Addendum)
History     CSN: [id number]  Arrival date & time 07/01/12  1419   First MD Initiated Contact with Patient 07/01/12 1517      Chief Complaint  Patient presents with  . Chest Pain    (Consider location/radiation/quality/duration/timing/severity/associated sxs/prior treatment) The history is provided by the patient.  Bradley [name] is a 71 y.o. male hx of HTN, CHF, afib on coumadin here with weakness, SOB, increased agitation. For the last two weeks, he felt weak. He had some diffuse abdominal pain that resolved and denies vomiting, diarrhea or fevers. He also has SOB on exertion occasionally. No chest pain. His legs have not been swollen and he denies paroxysmal dyspnea. He also has more arguments with his wife and son recently and they say that he is more annoyed recently. Finally, he has intermittent headaches that improves with tylenol.    Past Medical History  Diagnosis Date  . HTN (hypertension)   . HLD (hyperlipidemia)   . DM (diabetes mellitus) 1999    adult onset  . BPH (benign prostatic hyperplasia)     Saw Dr [name] 2004, normal renal u/s  . Leg ulcer 2011  . CHF (congestive heart failure)     Thought primarily to be non-systolic although EF down (EF 56-21% 12/2010, down to 35-40% 09/05/11), cath 2008 with no CAD, nuclear study 07/2011 showing Small area of reversibility in the distal ant/lat wall the left ventricle suspicious for ischemia/septal wall HK but felt to be low risk  (per D/C Summary 07/2011)  . Pleural effusion 2008    S/p decortication  . Insomnia   . Atrial fibrillation     s/p AV node ablation & BiV PPM implantation 09/08/11 (op dictation pending)  . Sinoatrial node dysfunction   . Headache   . Pulmonary HTN     Past Surgical History  Procedure Date  . Pleural scarification   . Lung decortication   . Colonoscopy 03/10/11    normal  . Pneumothorax with fibrothorax   . Insert / replace / remove pacemaker 09/08/11    pacemaker placement     Family History  Problem Relation Age of Onset  . Diabetes Father   . Breast cancer Maternal Aunt   . Heart attack Neg Hx   . Prostate cancer Neg Hx   . Colon cancer Neg Hx   . Cancer Neg Hx     colon or prostate  . Coronary artery disease Father   . Polycythemia Mother     History  Substance Use Topics  . Smoking status: Former Smoker -- 2.5 packs/day for 25 years    Types: Cigarettes    Quit date: 10/11/1980  . Smokeless tobacco: Never Used  . Alcohol Use: Yes     occassionally once a year      Review of Systems  Respiratory: Positive for shortness of breath.   Gastrointestinal: Positive for abdominal pain.  Neurological: Positive for weakness and headaches.  Psychiatric/Behavioral: Positive for agitation.  All other systems reviewed and are negative.    Allergies  Avelox and Tadalafil  Home Medications   Current Outpatient Rx  Name Route Sig Dispense Refill  . ASPIRIN 81 MG PO CHEW Oral Chew 81 mg by mouth daily.      Marland Kitchen CARVEDILOL 25 MG PO TABS Oral Take 1.5 tablets (37.5 mg total) by mouth 2 (two) times daily with a meal. 270 tablet 3  . DM-GUAIFENESIN ER 30-600 MG PO TB12 Oral Take 1 tablet by mouth as  needed. For congestion    . FLUTICASONE PROPIONATE 50 MCG/ACT NA SUSP Nasal Place 1 spray into the nose daily.     . FUROSEMIDE 40 MG PO TABS Oral Take 1.5 tablets (60 mg total) by mouth 2 (two) times daily. 270 tablet 3  . GLUCOSE BLOOD VI STRP  Use as instructed 100 each 12    Check once daily. Dx. Diabetes Mellitus 250.0  . GLYBURIDE 5 MG PO TABS Oral Take 2 tablets (10 mg total) by mouth 2 (two) times daily with a meal. 360 tablet 3  . LOSARTAN POTASSIUM 100 MG PO TABS Oral Take 1 tablet (100 mg total) by mouth daily. 90 tablet 3  . THERA M PLUS PO TABS Oral Take 1 tablet by mouth daily.      Marland Kitchen NIACIN-LOVASTATIN ER 1000-20 MG PO TB24 Oral Take 1 tablet by mouth at bedtime. 90 tablet 3  . NON FORMULARY Nasal Place into the nose daily. 2 liters of oxygen     . PANTOPRAZOLE SODIUM 40 MG PO TBEC Oral Take 1 tablet (40 mg total) by mouth every morning. 90 tablet 3  . PANTOPRAZOLE SODIUM 40 MG PO TBEC Oral Take 40 mg by mouth daily as needed.    Marland Kitchen SITAGLIPTIN PHOSPHATE 100 MG PO TABS Oral Take 1 tablet (100 mg total) by mouth daily. 90 tablet 3  . WARFARIN SODIUM 5 MG PO TABS  Take 1 tablet by mouth daily as directed by [facility]. 90 tablet 3    This is a 90-day supply  . ZOLPIDEM TARTRATE 10 MG PO TABS Oral Take 1 tablet (10 mg total) by mouth at bedtime as needed. For sleep 90 tablet 1    BP 141/83  Pulse 76  Resp 22  SpO2 95%  Physical Exam  Nursing note and vitals reviewed. Constitutional: He is oriented to person, place, and time. He appears well-developed and well-nourished.       Calm   HENT:  Head: Normocephalic.  Mouth/Throat: Oropharynx is clear and moist.  Eyes: Conjunctivae normal are normal. Pupils are equal, round, and reactive to light.  Neck: Normal range of motion. Neck supple.  Cardiovascular: Normal rate, regular rhythm, normal heart sounds and intact distal pulses.   Pulmonary/Chest: Effort normal and breath sounds normal.       No crackles  Abdominal: Soft. Bowel sounds are normal.  Musculoskeletal: Normal range of motion. He exhibits no edema.  Neurological: He is alert and oriented to person, place, and time.       Nl sensation and motor   Skin: Skin is warm and dry.  Psychiatric: He has a normal mood and affect. His behavior is normal. Judgment and thought content normal.    ED Course  Procedures (including critical care time) CRITICAL CARE Performed by: Silverio Lay, DAVID   Total critical care time: 45 minutes  Critical care time was exclusive of separately billable procedures and treating other patients.  Critical care was necessary to treat or prevent imminent or life-threatening deterioration.  Critical care was time spent personally by me on the following activities: development of treatment  plan with patient and/or surrogate as well as nursing, discussions with consultants, evaluation of patient's response to treatment, examination of patient, obtaining history from patient or surrogate, ordering and performing treatments and interventions, ordering and review of laboratory studies, ordering and review of radiographic studies, pulse oximetry and re-evaluation of patient's condition.     Labs Reviewed  CBC WITH DIFFERENTIAL - Abnormal; Notable for  the following:    MCHC 36.4 (*)     All other components within normal limits  COMPREHENSIVE METABOLIC PANEL - Abnormal; Notable for the following:    Glucose, Bld 132 (*)     All other components within normal limits  PRO B NATRIURETIC PEPTIDE - Abnormal; Notable for the following:    Pro B Natriuretic peptide (BNP) 1377.0 (*)     All other components within normal limits  PROTIME-INR - Abnormal; Notable for the following:    Prothrombin Time 15.7 (*)     All other components within normal limits  TROPONIN I  URINALYSIS, ROUTINE W REFLEX MICROSCOPIC   Dg Chest 2 View  07/01/2012  *RADIOLOGY REPORT*  Clinical Data: Chest pain  CHEST - 2 VIEW  Comparison: 10/28/2011  Findings: Cardiomegaly again noted.  No acute infiltrate or pulmonary edema.  Stable chronic blunting of the costophrenic angles.  Dual lead cardiac pacemaker is unchanged in position.  IMPRESSION: No acute infiltrate or pulmonary edema.  Stable chronic blunting of the costophrenic angles.  Dual lead cardiac pacemaker is unchanged in position.   Original Report Authenticated By: Natasha Mead, M.D.    Dg Abd 1 View  07/01/2012  *RADIOLOGY REPORT*  Clinical Data: Abdominal pain  ABDOMEN - 1 VIEW  Comparison: None.  Findings: There is nonspecific nonobstructive bowel gas pattern. Moderate stool noted in the right colon.  No free abdominal air.  IMPRESSION: Nonspecific nonobstructive bowel gas pattern.  Moderate stool noted in the right colon.   Original Report Authenticated By:  Natasha Mead, M.D.    Ct Head Wo Contrast  07/01/2012  *RADIOLOGY REPORT*  Clinical Data: Headache for several days.  Diabetic hypertensive.  CT HEAD WITHOUT CONTRAST  Technique:  Contiguous axial images were obtained from the base of the skull through the vertex without contrast.  Comparison: 03/29/2009.  Findings: Right mid to anterior temporal horn hematoma with surrounding vasogenic edema.  Etiology indeterminate.  This may be related to a hypertensive hemorrhage, congophillic/amyloid angiopathy or result of underlying mass/vascular malformation which is not detected on the present examination or the prior unenhanced head CT.  The patient would benefit from follow-up until this clears to evaluate for possible underlying lesion.  Small amount of blood is seen in the dependent aspect of the occipital horns bilaterally.  The ventricular system is slightly more prominent than on prior examination which may be related atrophy rather hydrocephalus and can be accessed on follow-up.  Prominent small vessel disease type changes.  Remote left basal ganglia infarct.  Opacification left maxillary sinus with air fluid level.  IMPRESSION: Right temporal horn hematoma as detailed above with small amount of dependent blood within the occipital horn of the ventricles bilaterally.  Prominent small vessel disease type changes without CT evidence of large acute thrombotic infarct.  Critical Value/emergent results were called by telephone at the time of interpretation on 07/01/2012 and at 4:09 p.m. to Dr. Silverio Lay, who verbally acknowledged these results.   Original Report Authenticated By: Fuller Canada, M.D.      1. Intracranial hemorrhage      Date: 07/01/2012  Rate: 72  Rhythm: normal sinus rhythm  QRS Axis: normal  Intervals: normal  ST/T Wave abnormalities: normal  Conduction Disutrbances:right bundle branch block  Narrative Interpretation:   Old EKG Reviewed: unchanged    MDM  SEPHIROTH MCLUCKIE is a 71 y.o. male  hx of HTN, CHF here with weakness, intermittent SOB, agitation. Will consider infections, worsening heart failure, and possible  stroke. Will get labs, cxr, UA, CT head, bnp and reassess.   3:40pm  His CT showed R parenchymal bleed. I discussed results with family and patient. I also discussed with neurosurgeon, Dr. Danielle Dess, who recommend neuro consult and he will examine the patient when he arrives at [facility].    4:59 PM I talked to Dr. Roseanne Reno, neurologist, who accepted the patient to neuro intensive care unit at [facility]. Care link aware. His BP is around 140s systolic and will monitor for now.         Richardean Canal, MD 07/01/12 1700  Richardean Canal, MD 07/01/12 1700  Richardean Canal, MD 07/02/12 [phone]

## 2012-07-02 ENCOUNTER — Inpatient Hospital Stay (HOSPITAL_COMMUNITY): Payer: Medicare Other

## 2012-07-02 DIAGNOSIS — IMO0002 Reserved for concepts with insufficient information to code with codable children: Secondary | ICD-10-CM

## 2012-07-02 DIAGNOSIS — I6350 Cerebral infarction due to unspecified occlusion or stenosis of unspecified cerebral artery: Secondary | ICD-10-CM

## 2012-07-02 DIAGNOSIS — G936 Cerebral edema: Secondary | ICD-10-CM | POA: Diagnosis present

## 2012-07-02 DIAGNOSIS — I615 Nontraumatic intracerebral hemorrhage, intraventricular: Secondary | ICD-10-CM | POA: Diagnosis present

## 2012-07-02 DIAGNOSIS — I629 Nontraumatic intracranial hemorrhage, unspecified: Secondary | ICD-10-CM

## 2012-07-02 DIAGNOSIS — I4891 Unspecified atrial fibrillation: Secondary | ICD-10-CM

## 2012-07-02 LAB — LIPID PANEL
Cholesterol: 157 mg/dL (ref 0–200)
HDL: 43 mg/dL (ref >39)
LDL Cholesterol: 100 mg/dL — ABNORMAL HIGH (ref 0–99)
Total CHOL/HDL Ratio: 3.7 RATIO
Triglycerides: 69 mg/dL (ref <150)
VLDL: 14 mg/dL (ref 0–40)

## 2012-07-02 LAB — HEMOGLOBIN A1C
Hgb A1c MFr Bld: 5.9 % — ABNORMAL HIGH (ref <5.7)
Mean Plasma Glucose: 123 mg/dL — ABNORMAL HIGH (ref <117)

## 2012-07-02 LAB — MRSA PCR SCREENING: MRSA by PCR: NEGATIVE

## 2012-07-02 LAB — GLUCOSE, CAPILLARY
Glucose-Capillary: 89 mg/dL (ref 70–99)
Glucose-Capillary: 131 mg/dL — ABNORMAL HIGH (ref 70–99)
Glucose-Capillary: 115 mg/dL — ABNORMAL HIGH (ref 70–99)

## 2012-07-02 LAB — VITAMIN B12: Vitamin B-12: 415 pg/mL (ref 211–911)

## 2012-07-02 MED ORDER — IOHEXOL 350 MG/ML SOLN
50.0000 mL | Freq: Once | INTRAVENOUS | Status: AC | PRN
  Administered 2012-07-02: 50 mL via INTRAVENOUS

## 2012-07-02 NOTE — Progress Notes (Addendum)
Stroke Team Progress Note  SUBJECTIVE Bradley [name] is a 71 y.o. male  who first noticed mild right sided weakness approximately three weeks ago. Several days later, he began having headaches that have been persistent since that time. The persistent headaches prompted him to seek medical attention today. In the ER, he had a head CT showing a right temporal hematoma.  He states that he currently feels at his baseline other than a mild right weakness that he only notices in the morning.  He currently feels that his breathing is good and is in no distress from this angle.  LSN: three weeks ago  tPA Given: No: IPH He has remained stable overnight with BP adequately controlled. He denies focal neurological deficits except mild intermittent headaches. OBJECTIVE Most recent Vital Signs: Temp: 99.5 F (37.5 C) (09/22 0400) Temp src: Oral (09/22 0400) BP: 155/79 mmHg (09/22 0800) Pulse Rate: 75  (09/22 0800) Respiratory Rate: 20 O2 Saturdation: 96%  CBG (last 3)   Basename 07/01/12 2150  GLUCAP 150*   Intake/Output from previous day: 09/21 0701 - 09/22 0700 In: 840 [P.O.:840] Out: 1475 [Urine:1475]  IV Fluid Intake:    Diet: Carb Control   Activity: Up with assistance   DVT Prophylaxis:  SCDs  Studies: Results for orders placed during the hospital encounter of 07/01/12 (from the past 24 hour(s))  CBC WITH DIFFERENTIAL     Status: Abnormal   Collection Time   07/01/12  2:45 PM      Component Value Range   WBC 8.5  4.0 - 10.5 K/uL   RBC 4.99  4.22 - 5.81 MIL/uL   Hemoglobin 15.7  13.0 - 17.0 g/dL   HCT 16.1  09.6 - 04.5 %   MCV 86.4  78.0 - 100.0 fL   MCH 31.5  26.0 - 34.0 pg   MCHC 36.4 (*) 30.0 - 36.0 g/dL   RDW 40.9  81.1 - 91.4 %   Platelets 209  150 - 400 K/uL   Neutrophils Relative 73  43 - 77 %   Neutro Abs 6.2  1.7 - 7.7 K/uL   Lymphocytes Relative 15  12 - 46 %   Lymphs Abs 1.3  0.7 - 4.0 K/uL   Monocytes Relative 11  3 - 12 %   Monocytes Absolute 1.0  0.1 - 1.0  K/uL   Eosinophils Relative 0  0 - 5 %   Eosinophils Absolute 0.0  0.0 - 0.7 K/uL   Basophils Relative 0  0 - 1 %   Basophils Absolute 0.0  0.0 - 0.1 K/uL  COMPREHENSIVE METABOLIC PANEL     Status: Abnormal   Collection Time   07/01/12  2:45 PM      Component Value Range   Sodium 136  135 - 145 mEq/L   Potassium 4.1  3.5 - 5.1 mEq/L   Chloride 99  96 - 112 mEq/L   CO2 26  19 - 32 mEq/L   Glucose, Bld 132 (*) 70 - 99 mg/dL   BUN 13  6 - 23 mg/dL   Creatinine, Ser 7.82  0.50 - 1.35 mg/dL   Calcium 9.0  8.4 - 95.6 mg/dL   Total Protein 7.6  6.0 - 8.3 g/dL   Albumin 3.8  3.5 - 5.2 g/dL   AST 22  0 - 37 U/L   ALT 15  0 - 53 U/L   Alkaline Phosphatase 66  39 - 117 U/L   Total Bilirubin 0.8  0.3 -  1.2 mg/dL   GFR calc non Af Amer >90  >90 mL/min   GFR calc Af Amer >90  >90 mL/min  PRO B NATRIURETIC PEPTIDE     Status: Abnormal   Collection Time   07/01/12  2:45 PM      Component Value Range   Pro B Natriuretic peptide (BNP) 1377.0 (*) 0 - 125 pg/mL  TROPONIN I     Status: Normal   Collection Time   07/01/12  2:45 PM      Component Value Range   Troponin I <0.30  <0.30 ng/mL  PROTIME-INR     Status: Abnormal   Collection Time   07/01/12  2:45 PM      Component Value Range   Prothrombin Time 15.7 (*) 11.6 - 15.2 seconds   INR 1.28  0.00 - 1.49  URINALYSIS, ROUTINE W REFLEX MICROSCOPIC     Status: Normal   Collection Time   07/01/12  8:27 PM      Component Value Range   Color, Urine YELLOW  YELLOW   APPearance CLEAR  CLEAR   Specific Gravity, Urine 1.020  1.005 - 1.030   pH 6.5  5.0 - 8.0   Glucose, UA NEGATIVE  NEGATIVE mg/dL   Hgb urine dipstick NEGATIVE  NEGATIVE   Bilirubin Urine NEGATIVE  NEGATIVE   Ketones, ur NEGATIVE  NEGATIVE mg/dL   Protein, ur NEGATIVE  NEGATIVE mg/dL   Urobilinogen, UA 1.0  0.0 - 1.0 mg/dL   Nitrite NEGATIVE  NEGATIVE   Leukocytes, UA NEGATIVE  NEGATIVE  GLUCOSE, CAPILLARY     Status: Abnormal   Collection Time   07/01/12  9:50 PM       Component Value Range   Glucose-Capillary 150 (*) 70 - 99 mg/dL  MRSA PCR SCREENING     Status: Normal   Collection Time   07/01/12 11:00 PM      Component Value Range   MRSA by PCR NEGATIVE  NEGATIVE  LIPID PANEL     Status: Abnormal   Collection Time   07/02/12  5:10 AM      Component Value Range   Cholesterol 157  0 - 200 mg/dL   Triglycerides 69  <191 mg/dL   HDL 43  >47 mg/dL   Total CHOL/HDL Ratio 3.7     VLDL 14  0 - 40 mg/dL   LDL Cholesterol 829 (*) 0 - 99 mg/dL     Dg Chest 2 View  5/62/1308  *RADIOLOGY REPORT*  Clinical Data: Chest pain  CHEST - 2 VIEW  Comparison: 10/28/2011  Findings: Cardiomegaly again noted.  No acute infiltrate or pulmonary edema.  Stable chronic blunting of the costophrenic angles.  Dual lead cardiac pacemaker is unchanged in position.  IMPRESSION: No acute infiltrate or pulmonary edema.  Stable chronic blunting of the costophrenic angles.  Dual lead cardiac pacemaker is unchanged in position.   Original Report Authenticated By: Natasha Mead, M.D.    Dg Abd 1 View  07/01/2012  *RADIOLOGY REPORT*  Clinical Data: Abdominal pain  ABDOMEN - 1 VIEW  Comparison: None.  Findings: There is nonspecific nonobstructive bowel gas pattern. Moderate stool noted in the right colon.  No free abdominal air.  IMPRESSION: Nonspecific nonobstructive bowel gas pattern.  Moderate stool noted in the right colon.   Original Report Authenticated By: Natasha Mead, M.D.    Ct Head Wo Contrast  07/02/2012  *RADIOLOGY REPORT*  Clinical Data: Intracranial hemorrhage follow-up.  Mild right-sided weakness began 3 weeks  ago.  CT HEAD WITHOUT CONTRAST  Technique:  Contiguous axial images were obtained from the base of the skull through the vertex without contrast.  Comparison: 07/01/2012.  Findings: Blood within expanded right temporal horn unchanged. Mild surrounding vasogenic edema.  Cause indeterminate as previously discussed.  Dependent interventricular blood.  Atrophy and ventricular size  similar to the prior exam.  No new intracranial hemorrhage detected.  Prominent small vessel disease type changes.  IMPRESSION:  Blood within expanded right temporal horn unchanged.  Mild surrounding vasogenic edema.  Dependent interventricular blood.  No new intracranial hemorrhage detected.   Original Report Authenticated By: Fuller Canada, M.D.    Ct Head Wo Contrast  07/01/2012  *RADIOLOGY REPORT*  Clinical Data: Headache for several days.  Diabetic hypertensive.  CT HEAD WITHOUT CONTRAST  Technique:  Contiguous axial images were obtained from the base of the skull through the vertex without contrast.  Comparison: 03/29/2009.  Findings: Right mid to anterior temporal horn hematoma with surrounding vasogenic edema.  Etiology indeterminate.  This may be related to a hypertensive hemorrhage, congophillic/amyloid angiopathy or result of underlying mass/vascular malformation which is not detected on the present examination or the prior unenhanced head CT.  The patient would benefit from follow-up until this clears to evaluate for possible underlying lesion.  Small amount of blood is seen in the dependent aspect of the occipital horns bilaterally.  The ventricular system is slightly more prominent than on prior examination which may be related atrophy rather hydrocephalus and can be accessed on follow-up.  Prominent small vessel disease type changes.  Remote left basal ganglia infarct.  Opacification left maxillary sinus with air fluid level.  IMPRESSION: Right temporal horn hematoma as detailed above with small amount of dependent blood within the occipital horn of the ventricles bilaterally.  Prominent small vessel disease type changes without CT evidence of large acute thrombotic infarct.  Critical Value/emergent results were called by telephone at the time of interpretation on 07/01/2012 and at 4:09 p.m. to Dr. Silverio Lay, who verbally acknowledged these results.   Original Report Authenticated By: Fuller Canada,  M.D.     Physical Exam:  Pleasant elderly caucasian male not in distress.Awake alert. Afebrile. Head is nontraumatic. Neck is supple without bruit. Hearing is normal. Cardiac exam no murmur or gallop. Lungs are clear to auscultation. Distal pulses are well felt.  Neurological Exam :   Awake alert oriented x3 with normal speech and language function. Intact attention, registration and memory. Follows 3 step commands. Extraocular movements are full range without nystagmus. No visual field deficit and bedside confrontational exam. Fundi were not visualized. Visual acuity appears normal. Face is symmetric. Tongue is midline. Motor system exam reveals no upper or lower eczema to drift. Symmetric and equal strength in all 4 extremities. Touch and pinprick sensation intact. Coordination is normal. Gait was not tested. ASSESSMENT Bradley [name] is a 71 y.o. male  with a subacute right temporal lobar hematoma with mild surrounding cytotoxic edema and dilatation of temporal horn with intraventricular extension but no hydrocephalus. Etiology is probably hypertensive. Stroke risk factors:  hypertension  Hospital day # 1  TREATMENT/PLAN Continue intensive neurological monitoring and tight blood pressure control with goal below 180/100. Patient cannot have an MRI due to pacemaker. Check CT angiogram to look for any underlying AVMs are aneurysms. DVT and GI prophylaxis. Physical, occupational and speech therapy consults. Mobilize out of bed. No family or verbal at bedside at present time. Patient cannot go back on warfarin.  May resume aspirin after 2-3 weeks. This patient is critically ill and at significant risk of neurological worsening, death and care requires constant monitoring of vital signs, hemodynamics,respiratory and cardiac monitoring,review of multiple databases, neurological assessment, discussion with family, other specialists and medical decision making of high complexity. I spent 30 minutes of  neurocritical care time  in the care of  this patient.   Gates Rigg, MD [facility] Pager: [phone] 07/02/2012 9:06 AM

## 2012-07-02 NOTE — Progress Notes (Signed)
PT Cancellation Note  Treatment cancelled today due to medical issues with patient which prohibited therapy.  Patient with bedrest orders.  Will return at later time to attempt PT eval.  Bradley [name] 07/02/2012, 7:34 AM [phone]

## 2012-07-02 NOTE — Progress Notes (Signed)
Patient ID: Bradley [name], male   DOB: 11/22/1940, 71 y.o.   MRN: [id number] Alert, without focal deficit still complaining of headache.  Neuro without changes continue to observe workup per neurology

## 2012-07-02 NOTE — Evaluation (Signed)
Speech Language Pathology Evaluation Patient Details Name: Bradley [name] MRN: [id number] DOB: September 17, 1941 Today's Date: 07/02/2012 Time: 1330-1400 SLP Time Calculation (min): 30 min  Problem List:  Patient Active Problem List  Diagnosis  . AODM  . HYPERLIPIDEMIA  . ERECTILE DYSFUNCTION  . INSOMNIA, CHRONIC  . HYPERTENSION  . PULMONARY HYPERTENSION  . Permanent atrial fibrillation  . DERMATITIS, STASIS  . BENIGN PROSTATIC HYPERTROPHY  . Chronic respiratory failure  . PSA, INCREASED  . CLAUDICATION  . Long term current use of anticoagulant  . Chronic diastolic heart failure  . Chest pain  . Systolic and diastolic CHF, acute  . Diabetes mellitus  . Weight loss  . nonischemic cardiomyopathy  . Sinoatrial node dysfunction  . Hypokalemia  . Systolic CHF, acute on chronic  . Lymphadenopathy  . HTN (hypertension)  . Pacemaker  . Chronic systolic heart failure  . Annual physical exam  . Cytotoxic brain edema  . Intraventricular hemorrhage  . Brain hematoma   Past Medical History:  Past Medical History  Diagnosis Date  . HTN (hypertension)   . HLD (hyperlipidemia)   . DM (diabetes mellitus) 1999    adult onset  . BPH (benign prostatic hyperplasia)     Saw Dr [name] 2004, normal renal u/s  . Leg ulcer 2011  . CHF (congestive heart failure)     Thought primarily to be non-systolic although EF down (EF 78-29% 12/2010, down to 35-40% 09/05/11), cath 2008 with no CAD, nuclear study 07/2011 showing Small area of reversibility in the distal ant/lat wall the left ventricle suspicious for ischemia/septal wall HK but felt to be low risk  (per D/C Summary 07/2011)  . Pleural effusion 2008    S/p decortication  . Insomnia   . Atrial fibrillation     s/p AV node ablation & BiV PPM implantation 09/08/11 (op dictation pending)  . Sinoatrial node dysfunction   . Headache   . Pulmonary HTN    Past Surgical History:  Past Surgical History  Procedure Date  . Pleural scarification     . Lung decortication   . Colonoscopy 03/10/11    normal  . Pneumothorax with fibrothorax   . Insert / replace / remove pacemaker 09/08/11    pacemaker placement   HPI:  Bradley [name] is an 71 y.o. male who first noticed mild right sided weakness approximately three weeks ago.  Patient presents to ED with headache.   CT of head indicates  Shows  right temporal hematoma.  Patient referred for Cognitive Linguistic Evaluation per stroke protocol.    Assessment / Plan / Recommendation Clinical Impression  Cognitive Linguistic skills baseline.  No further Speech Language Pathology Services warranted as patient will receive necessary supervision. ST to sign off as education complete.    SLP Assessment  Patient does not need any further Speech Lanaguage Pathology Services    Follow Up Recommendations  None              SLP Evaluation Prior Functioning  Cognitive/Linguistic Baseline: Within functional limits Type of Home: House Lives With: Spouse Available Help at Discharge: Family;Available 24 hours/day Education: Highschool  Vocation: Retired   IT consultant  Overall Cognitive Status: Appears within functional limits for tasks assessed    Comprehension  Auditory Comprehension Overall Auditory Comprehension: Appears within functional limits for tasks assessed    Expression Expression Primary Mode of Expression: Verbal Verbal Expression Overall Verbal Expression: Appears within functional limits for tasks assessed   Oral / Motor Oral Motor/Sensory  Function Overall Oral Motor/Sensory Function: Appears within functional limits for tasks assessed Motor Speech Overall Motor Speech: Appears within functional limits for tasks assessed   GO Moreen Fowler MS, CCC-SLP [phone]   [facility] 07/02/2012, 2:13 PM

## 2012-07-02 NOTE — Progress Notes (Signed)
  Echocardiogram 2D Echocardiogram has been performed.  Bradley [name] 07/02/2012, 11:24 AM

## 2012-07-02 NOTE — Progress Notes (Signed)
*  PRELIMINARY RESULTS* Vascular Ultrasound Carotid Duplex (Doppler) has been completed.  No obvious evidence of hemodynamically significant internal carotid artery stenosis. Vertebral arteries are patent with antegrade flow.  07/02/2012 1:12 PM Gertie Fey, RDMS, RDCS

## 2012-07-03 ENCOUNTER — Telehealth: Payer: Self-pay | Admitting: Internal Medicine

## 2012-07-03 LAB — GLUCOSE, CAPILLARY
Glucose-Capillary: 143 mg/dL — ABNORMAL HIGH (ref 70–99)
Glucose-Capillary: 121 mg/dL — ABNORMAL HIGH (ref 70–99)
Glucose-Capillary: 141 mg/dL — ABNORMAL HIGH (ref 70–99)

## 2012-07-03 MED ORDER — PANTOPRAZOLE SODIUM 40 MG PO TBEC
40.0000 mg | DELAYED_RELEASE_TABLET | Freq: Every day | ORAL | Status: DC | PRN
  Filled 2012-07-03: qty 1

## 2012-07-03 MED ORDER — PANTOPRAZOLE SODIUM 40 MG PO TBEC
40.0000 mg | DELAYED_RELEASE_TABLET | Freq: Every day | ORAL | Status: DC
  Administered 2012-07-03 – 2012-07-04 (×2): 40 mg via ORAL
  Filled 2012-07-03: qty 1

## 2012-07-03 NOTE — Telephone Encounter (Signed)
New Problem:   Patient's wife called in wanting to speak with you because her husband is in the [facility] hospital and the physicians there would like to take him off of his coumadin.  Please call back.

## 2012-07-03 NOTE — Progress Notes (Signed)
Nutrition Brief Note  Patient identified on the Malnutrition Screening Tool (MST) report for weight loss with out trying and poor appetite, generating a score of 2.  Pt reports that he had lost a significant amount of weight, but his MD told him to eat more and has been gaining weight since that time. States that at this time his appetite is good.  Weight hx indicates stable weight x 5 months.  Wt Readings from Last 5 Encounters:  07/03/12 206 lb 5.6 oz (93.6 kg)  05/08/12 208 lb (94.348 kg)  01/31/12 207 lb 9.6 oz (94.167 kg)  12/13/11 221 lb (100.245 kg)  11/02/11 217 lb 2 oz (98.487 kg)     Body mass index is 30.47 kg/(m^2). Pt meets criteria for obese class 1 based on current BMI.   Current diet order is Carb Mod Medium, patient is consuming approximately 75-100% of meals at this time. Labs and medications reviewed.   No nutrition interventions warranted at this time. If nutrition issues arise, please consult RD.   Clarene Duke RD, LDN Pager [phone] After Hours pager [phone]

## 2012-07-03 NOTE — Progress Notes (Signed)
Stroke Team Progress Note  HISTORY [name] is a 71 y.o. male who first noticed mild right sided weakness approximately three weeks ago. Several days later, he began having headaches that have been persistent since that time. The persistent headaches prompted him to seek medical attention 07/01/2102. In the ER, he had a head CT showing a right temporal hematoma. He states that he currently feels at his baseline other than a mild right weakness that he only notices in the morning. He currently feels that his breathing is good and is in no distress from this angle. Patient was not a TPA candidate secondary to hemorrhage. He was admitted to the neuro ICU for further evaluation and treatment.  SUBJECTIVE No family is at the bedside.  Overall he feels his condition is gradually improving.   OBJECTIVE Most recent Vital Signs: Filed Vitals:   07/03/12 0500 07/03/12 0600 07/03/12 0645 07/03/12 0700  BP: 156/86 167/94 151/72 155/80  Pulse: 70 69 70 69  Temp:      TempSrc:      Resp: 23 19 20 16   Height:      Weight:      SpO2: 93% 97%  96%   CBG (last 3)   Basename 07/02/12 2145 07/02/12 1648 07/02/12 1303  GLUCAP 115* 131* 89   Intake/Output from previous day: 09/22 0701 - 09/23 0700 In: 1200 [P.O.:1200] Out: 2700 [Urine:2700]  IV Fluid Intake:     MEDICATIONS    . carvedilol  37.5 mg Oral BID WC  . furosemide  60 mg Oral BID  . glyBURIDE  10 mg Oral BID WC  . linagliptin  5 mg Oral Daily  . losartan  100 mg Oral Daily  . niacin  1,000 mg Oral QHS   And  . simvastatin  10 mg Oral QHS  . pantoprazole (PROTONIX) IV  40 mg Intravenous QHS  . senna-docusate  1 tablet Oral BID  . DISCONTD: antiseptic oral rinse  15 mL Mouth Rinse BID   PRN:  acetaminophen, acetaminophen, iohexol, labetalol, ondansetron (ZOFRAN) IV  Diet:  Carb Control thin liquids Activity:  As tolerated  DVT Prophylaxis:  SCDs   CLINICALLY SIGNIFICANT STUDIES Basic Metabolic Panel:  Lab 07/01/12 9147    NA 136  K 4.1  CL 99  CO2 26  GLUCOSE 132*  BUN 13  CREATININE 0.70  CALCIUM 9.0  MG --  PHOS --   Liver Function Tests:  Lab 07/01/12 1445  AST 22  ALT 15  ALKPHOS 66  BILITOT 0.8  PROT 7.6  ALBUMIN 3.8   CBC:  Lab 07/01/12 1445  WBC 8.5  NEUTROABS 6.2  HGB 15.7  HCT 43.1  MCV 86.4  PLT 209   Coagulation:  Lab 07/01/12 1445  LABPROT 15.7*  INR 1.28   Cardiac Enzymes:  Lab 07/01/12 1445  CKTOTAL --  CKMB --  CKMBINDEX --  TROPONINI <0.30   Urinalysis:  Lab 07/01/12 2027  COLORURINE YELLOW  LABSPEC 1.020  PHURINE 6.5  GLUCOSEU NEGATIVE  HGBUR NEGATIVE  BILIRUBINUR NEGATIVE  KETONESUR NEGATIVE  PROTEINUR NEGATIVE  UROBILINOGEN 1.0  NITRITE NEGATIVE  LEUKOCYTESUR NEGATIVE   Lipid Panel    Component Value Date/Time   CHOL 157 07/02/2012 0510   TRIG 69 07/02/2012 0510   HDL 43 07/02/2012 0510   CHOLHDL 3.7 07/02/2012 0510   VLDL 14 07/02/2012 0510   LDLCALC 100* 07/02/2012 0510   HgbA1C  Lab Results  Component Value Date   HGBA1C 5.9*  07/02/2012    Urine Drug Screen:   No results found for this basename: labopia, cocainscrnur, labbenz, amphetmu, thcu, labbarb    Alcohol Level: No results found for this basename: ETH:2 in the last 168 hours  Abd 1 View 07/01/2012  Nonspecific nonobstructive bowel gas pattern.  Moderate stool noted in the right colon.    CT of the brain   07/02/2012  Blood within expanded right temporal horn unchanged.  Mild surrounding vasogenic edema.  Dependent interventricular blood.  No new intracranial hemorrhage detected.    07/01/2012 Right temporal horn hematoma as detailed above with small amount of dependent blood within the occipital horn of the ventricles bilaterally.  Prominent small vessel disease type changes without CT evidence of large acute thrombotic infarct.    CT Angio Head  07/02/2012 Hematoma is located within the mid and anterior right temporal horn which is expanded with surrounding vasogenic edema.  I  suspect that the hemorrhage is related to the choroid.  There is slight prominence of the right anterior choroidal artery compared to the left.  This may reflect the presence of underlying vascular malformation not well demonstrated on the present exam or secondary to blood supply of underlying choroidal mass.  Attention to these possibilities on follow-up imaging recommended after hemorrhage has cleared.  Small amount of subarachnoid blood is seen in the dependent aspect of the lateral ventricles.  Left internal carotid artery is dilated just proximal to the petrous segment raising possibility fibromuscular dysplasia. Alternatively this may relate to atherosclerotic type changes.    MRI of the brain  Pacer  MRA of the brain  pacer  2D Echocardiogram    Carotid Doppler  No evidence of hemodynamically significant internal carotid artery stenosis. Vertebral artery flow is antegrade.   CXR  07/01/2012 No acute infiltrate or pulmonary edema.  Stable chronic blunting of the costophrenic angles.  Dual lead cardiac pacemaker is unchanged in position.    EKG  RBBB, pacer.   Therapy Recommendations PT - ; OT - ; ST - none  Physical Exam  Pleasant elderly caucasian male not in distress.Awake alert. Afebrile. Head is nontraumatic. Neck is supple without bruit. Hearing is normal. Cardiac exam no murmur or gallop. Lungs are clear to auscultation. Distal pulses are well felt.  Neurological Exam : Awake alert oriented x3 with normal speech and language function. Intact attention, registration and memory. Follows 3 step commands. Extraocular movements are full range without nystagmus. No visual field deficit and bedside confrontational exam. Fundi were not visualized. Visual acuity appears normal. Face is symmetric. Tongue is midline. Motor system exam reveals no upper or lower eczema to drift. Symmetric and equal strength in all 4 extremities. Touch and pinprick sensation intact. Coordination is normal. Gait was  not tested.  ASSESSMENT [name] is a 71 y.o. male presenting with right hemiparesis and headaches. Imaging confirms a subacute right temporal lobar hematoma with mild surrounding cytotoxic edema and dilatation of temporal horn with intraventricular extension but no hydrocephalus. Hemorrhage is secondary to presumed hypertension and coumadin related. CT angio of the head was non conclusive to assist with dx. Will need cerebral angio in the future. Dr. Pearlean Brownie to discuss with Dr. Corliss Skains. On warfarin prior to admission for atrial fibrillation, INR 1.28 on arrival.  atrial fibrillation, no longer a coumadin candidate secondary to hemorrhage Hypertension Hyperlipidemia, LDL 100, on statin PTA Diabetes, HgbA1c 5.9 BPH CHF - Thought primarily to be non-systolic although EF down (EF 16-10% 12/2010, down to 35-40%  09/05/11), cath 2008 with no CAD, nuclear study 07/2011 showing Small area of reversibility in the distal ant/lat wall the left ventricle suspicious for ischemia/septal wall HK but felt to be low risk (per D/C Summary 07/2011)  Insomnia  Hospital day # 2  TREATMENT/PLAN  Add lovenox for VTE prophylaxis  Consider cerebral angio in the future. Sethi to discuss with Deveshwar  OOB, therapy evals  Transfer to the floor  Likely discharge home tomorrow.  Annie Main, MSN, RN, ANVP-BC, ANP-BC, GNP-BC [facility] Pager: [phone] 07/03/2012 8:11 AM  Scribe for Dr. Delia Heady, Stroke Center Medical Director, who has personally reviewed chart, pertinent data, examined the patient and developed the plan of care. Pager:  [phone]

## 2012-07-03 NOTE — Progress Notes (Signed)
PT SCREEN Received call from Tory Emerald OT  who has evaluated pt. This am and has found him to be at his baseline level of functioning.  She states she does not recommend PT procced with eval as he is mobilizing well and at baseline.  PT eval will therefore be withheld, as pt. Mobilizing at baseline.  Will sign off.  Please notify PT if any further issues arise. Thank You, Weldon Picking, PT Weldon Picking PT [facility] [phone] Bee[place] [phone]

## 2012-07-03 NOTE — Telephone Encounter (Signed)
Spoke with patient   He is in room 3107.  I will let Dr Ladona Ridgel know so we can go by.  Trish aware.

## 2012-07-03 NOTE — Evaluation (Signed)
Occupational Therapy Evaluation One Time See and Discharge Note Patient Details Name: Bradley [name] MRN: [id number] DOB: 24-Nov-1940 Today's Date: 07/03/2012 Time: 1045-1100 OT Time Calculation (min): 15 min  OT Assessment / Plan / Recommendation Clinical Impression  Pt admitted for temporal bleed but has since returned to his baseline level of functioning.  No OT needs at this time.    OT Assessment  Patient does not need any further OT services    Follow Up Recommendations  No OT follow up    Barriers to Discharge      Equipment Recommendations  None recommended by OT    Recommendations for Other Services    Frequency       Precautions / Restrictions Precautions Precautions: None Restrictions Weight Bearing Restrictions: No   Pertinent Vitals/Pain Pt with 3/10 pain in head.    ADL  Eating/Feeding: Performed;Independent Where Assessed - Eating/Feeding: Chair Grooming: Performed;Wash/dry hands;Wash/dry face;Teeth care;Independent Where Assessed - Grooming: Unsupported standing Upper Body Bathing: Simulated;Independent Where Assessed - Upper Body Bathing: Unsupported standing Lower Body Bathing: Simulated;Independent Where Assessed - Lower Body Bathing: Unsupported sit to stand Upper Body Dressing: Simulated;Independent Where Assessed - Upper Body Dressing: Unsupported sitting Lower Body Dressing: Performed;Independent Where Assessed - Lower Body Dressing: Unsupported sit to stand Toilet Transfer: Performed;Modified independent Toilet Transfer Method: Sit to Barista: Grab bars;Regular height toilet Toileting - Clothing Manipulation and Hygiene: Performed;Independent Where Assessed - Toileting Clothing Manipulation and Hygiene: Standing Transfers/Ambulation Related to ADLs: independent. ADL Comments: independent with all adls.    OT Diagnosis:    OT Problem List:   OT Treatment Interventions:     OT Goals    Visit Information  Last OT  Received On: 07/03/12 Assistance Needed: +1    Subjective Data  Subjective: "I feel great." Patient Stated Goal: to go home.   Prior Functioning  Vision/Perception  Home Living Lives With: Spouse Available Help at Discharge: Family;Available 24 hours/day Type of Home: House Home Access: Stairs to enter [employer] of Steps: 4 Entrance Stairs-Rails: Right Home Layout: Multi-level Alternate Level Stairs-Number of Steps: 10+ Alternate Level Stairs-Rails: Right Bathroom Shower/Tub: Health visitor: Standard Home Adaptive Equipment: None Prior Function Level of Independence: Independent Able to Take Stairs?: Yes Driving: Yes Vocation: Retired Musician: No difficulties Dominant Hand: Right   Vision - Assessment Vision Assessment: Vision not tested  Cognition  Overall Cognitive Status: Appears within functional limits for tasks assessed/performed Arousal/Alertness: Awake/alert Orientation Level: Oriented X4 / Intact Behavior During Session: [facility] for tasks performed Cognition - Other Comments: intact.    Extremity/Trunk Assessment Right Upper Extremity Assessment RUE ROM/Strength/Tone: Within functional levels RUE Sensation: WFL - Light Touch RUE Coordination: WFL - gross/fine motor Left Upper Extremity Assessment LUE ROM/Strength/Tone: Within functional levels LUE Sensation: WFL - Light Touch LUE Coordination: WFL - gross/fine motor Trunk Assessment Trunk Assessment: Normal   Mobility  Shoulder Instructions  Transfers Transfers: Sit to Stand;Stand to Sit Sit to Stand: 7: Independent;With upper extremity assist;From chair/3-in-1 Stand to Sit: 7: Independent;Without upper extremity assist;To chair/3-in-1 Details for Transfer Assistance: independent with all mobility.       Exercise     Balance     End of Session OT - End of Session Activity Tolerance: Patient tolerated treatment well Patient left: in chair;with  call bell/phone within reach Nurse Communication: Mobility status  GO     Hope Budds 07/03/2012, 11:10 AM [phone]

## 2012-07-04 LAB — GLUCOSE, CAPILLARY
Glucose-Capillary: 116 mg/dL — ABNORMAL HIGH (ref 70–99)
Glucose-Capillary: 120 mg/dL — ABNORMAL HIGH (ref 70–99)

## 2012-07-04 NOTE — Progress Notes (Signed)
Stroke Team Progress Note  HISTORY Bradley [name] is a 71 y.o. male who first noticed mild right sided weakness approximately three weeks ago. Several days later, he began having headaches that have been persistent since that time. The persistent headaches prompted him to seek medical attention 07/01/2102. In the ER, he had a head CT showing a right temporal hematoma. He states that he currently feels at his baseline other than a mild right weakness that he only notices in the morning. He currently feels that his breathing is good and is in no distress from this angle. Patient was not a TPA candidate secondary to hemorrhage. He was admitted to the neuro ICU for further evaluation and treatment.  SUBJECTIVE No family is at the bedside.  He would like to go home today.  OBJECTIVE Most recent Vital Signs: Filed Vitals:   07/03/12 2145 07/04/12 0223 07/04/12 0610 07/04/12 0908  BP: 153/85 168/87 164/93 136/69  Pulse: 75 70 72 70  Temp: 98.5 F (36.9 C) 98.4 F (36.9 C) 98.1 F (36.7 C) 97.6 F (36.4 C)  TempSrc: Oral Oral Oral Oral  Resp: 18 18 20 19   Height:      Weight:      SpO2: 96% 96% 96% 99%   CBG (last 3)   Basename 07/03/12 2148 07/03/12 1220 07/03/12 0816  GLUCAP 141* 121* 143*   MEDICATIONS     . carvedilol  37.5 mg Oral BID WC  . furosemide  60 mg Oral BID  . glyBURIDE  10 mg Oral BID WC  . linagliptin  5 mg Oral Daily  . losartan  100 mg Oral Daily  . niacin  1,000 mg Oral QHS   And  . simvastatin  10 mg Oral QHS  . pantoprazole  40 mg Oral Q1200  . senna-docusate  1 tablet Oral BID  . DISCONTD: pantoprazole (PROTONIX) IV  40 mg Intravenous QHS   PRN:  acetaminophen, acetaminophen, labetalol, ondansetron (ZOFRAN) IV, pantoprazole  Diet:  Carb Control thin liquids Activity:  As tolerated  DVT Prophylaxis:  SCDs   CLINICALLY SIGNIFICANT STUDIES Basic Metabolic Panel:   Lab 07/01/12 1445  NA 136  K 4.1  CL 99  CO2 26  GLUCOSE 132*  BUN 13  CREATININE  0.70  CALCIUM 9.0  MG --  PHOS --   Liver Function Tests:   Lab 07/01/12 1445  AST 22  ALT 15  ALKPHOS 66  BILITOT 0.8  PROT 7.6  ALBUMIN 3.8   CBC:   Lab 07/01/12 1445  WBC 8.5  NEUTROABS 6.2  HGB 15.7  HCT 43.1  MCV 86.4  PLT 209   Coagulation:   Lab 07/01/12 1445  LABPROT 15.7*  INR 1.28   Cardiac Enzymes:   Lab 07/01/12 1445  CKTOTAL --  CKMB --  CKMBINDEX --  TROPONINI <0.30   Urinalysis:   Lab 07/01/12 2027  COLORURINE YELLOW  LABSPEC 1.020  PHURINE 6.5  GLUCOSEU NEGATIVE  HGBUR NEGATIVE  BILIRUBINUR NEGATIVE  KETONESUR NEGATIVE  PROTEINUR NEGATIVE  UROBILINOGEN 1.0  NITRITE NEGATIVE  LEUKOCYTESUR NEGATIVE   Lipid Panel    Component Value Date/Time   CHOL 157 07/02/2012 0510   TRIG 69 07/02/2012 0510   HDL 43 07/02/2012 0510   CHOLHDL 3.7 07/02/2012 0510   VLDL 14 07/02/2012 0510   LDLCALC 100* 07/02/2012 0510   HgbA1C  Lab Results  Component Value Date   HGBA1C 5.9* 07/02/2012    Urine Drug Screen:   No  results found for this basename: labopia,  cocainscrnur,  labbenz,  amphetmu,  thcu,  labbarb    Alcohol Level: No results found for this basename: ETH:2 in the last 168 hours  Abd 1 View 07/01/2012  Nonspecific nonobstructive bowel gas pattern.  Moderate stool noted in the right colon.    CT of the brain   07/02/2012  Blood within expanded right temporal horn unchanged.  Mild surrounding vasogenic edema.  Dependent interventricular blood.  No new intracranial hemorrhage detected.    07/01/2012 Right temporal horn hematoma as detailed above with small amount of dependent blood within the occipital horn of the ventricles bilaterally.  Prominent small vessel disease type changes without CT evidence of large acute thrombotic infarct.    CT Angio Head  07/02/2012 Hematoma is located within the mid and anterior right temporal horn which is expanded with surrounding vasogenic edema.  I suspect that the hemorrhage is related to the choroid.  There  is slight prominence of the right anterior choroidal artery compared to the left.  This may reflect the presence of underlying vascular malformation not well demonstrated on the present exam or secondary to blood supply of underlying choroidal mass.  Attention to these possibilities on follow-up imaging recommended after hemorrhage has cleared.  Small amount of subarachnoid blood is seen in the dependent aspect of the lateral ventricles.  Left internal carotid artery is dilated just proximal to the petrous segment raising possibility fibromuscular dysplasia. Alternatively this may relate to atherosclerotic type changes.    MRI of the brain  Pacer  MRA of the brain  pacer  2D Echocardiogram  EF 45-50% with no source of embolus.   Carotid Doppler  No evidence of hemodynamically significant internal carotid artery stenosis. Vertebral artery flow is antegrade.   CXR  07/01/2012 No acute infiltrate or pulmonary edema.  Stable chronic blunting of the costophrenic angles.  Dual lead cardiac pacemaker is unchanged in position.    EKG  RBBB, pacer.   Therapy Recommendations PT - none ; OT - none; ST - none  Physical Exam  Pleasant elderly caucasian male not in distress.Awake alert. Afebrile. Head is nontraumatic. Neck is supple without bruit. Hearing is normal. Cardiac exam no murmur or gallop. Lungs are clear to auscultation. Distal pulses are well felt.  Neurological Exam : Awake alert oriented x3 with normal speech and language function. Intact attention, registration and memory. Follows 3 step commands. Extraocular movements are full range without nystagmus. No visual field deficit and bedside confrontational exam. Fundi were not visualized. Visual acuity appears normal. Face is symmetric. Tongue is midline. Motor system exam reveals no upper or lower eczema to drift. Symmetric and equal strength in all 4 extremities. Touch and pinprick sensation intact. Coordination is normal. Gait was not  tested.  ASSESSMENT Bradley [name] is a 71 y.o. male presenting with right hemiparesis and headaches. Imaging confirms a subacute right temporal lobar hematoma with mild surrounding cytotoxic edema and dilatation of temporal horn with intraventricular extension but no hydrocephalus. Hemorrhage is secondary to presumed hypertension and coumadin related. CT angio of the head was non conclusive to assist with dx. Will need cerebral angio in the future. Dr. Pearlean Brownie to discuss with Dr. Corliss Skains. On warfarin prior to admission for atrial fibrillation, INR 1.28 on arrival.  atrial fibrillation, no longer a coumadin candidate secondary to hemorrhage, life long. Hypertension Hyperlipidemia, LDL 100, on statin PTA Diabetes, HgbA1c 5.9 BPH CHF - Thought primarily to be non-systolic although EF down (EF  45-50% 12/2010, down to 35-40% 09/05/11), cath 2008 with no CAD, nuclear study 07/2011 showing Small area of reversibility in the distal ant/lat wall the left ventricle suspicious for ischemia/septal wall HK but felt to be low risk (per D/C Summary 07/2011)  Insomnia  Hospital day # 3  TREATMENT/PLAN  No anticoagulation for afib given ICH  Patient to call [facility] to arrange CT head in 1 week. If imaging shows resolving hemorrhage, ok to resume aspirin given atrial fibrillation   Consider cerebral angio in the future. Sethi to discuss with Deveshwar  discharge home Follow up with Dr. Pearlean Brownie, [facility], in 2 months.  Annie Main, MSN, RN, ANVP-BC, ANP-BC, [facility] Pager: [phone] 07/04/2012 11:17 AM  Scribe for Dr. Delia Heady, Stroke Center Medical Director, who has personally reviewed chart, pertinent data, examined the patient and developed the plan of care. Pager:  [phone]

## 2012-07-04 NOTE — Discharge Summary (Signed)
Stroke Discharge Summary  Patient ID: Bradley [name]   MRN: [id number]      DOB: December 14, 1940  Date of Admission: 07/01/2012 Date of Discharge: 07/04/2012  Attending Physician:  Darcella Cheshire, MD, Stroke MD  Consulting Physician(s):     neurosurgery, Dr. Barnett Abu Patient's PCP:  [name], MD  Discharge Diagnoses:  Principal Problem:  *Brain hematoma - subacute right temporal lobar hematoma with mild surrounding cytotoxic edema and dilatation of temporal horn with intraventricular extension but no hydrocephalus. Hemorrhage is secondary to presumed hypertension and coumadin treatment. Active Problems:  Hypertension  Hyperlipidemia  Diabetes   Atrial fibrillation   Obesity class I,  BMI: Body mass index is 30.47 kg/(m^2).  Past Medical History  Diagnosis Date  . HTN (hypertension)   . HLD (hyperlipidemia)   . DM (diabetes mellitus) 1999    adult onset  . BPH (benign prostatic hyperplasia)     Saw Dr [name] 2004, normal renal u/s  . Leg ulcer 2011  . CHF (congestive heart failure)     Thought primarily to be non-systolic although EF down (EF 40-98% 12/2010, down to 35-40% 09/05/11), cath 2008 with no CAD, nuclear study 07/2011 showing Small area of reversibility in the distal ant/lat wall the left ventricle suspicious for ischemia/septal wall HK but felt to be low risk  (per D/C Summary 07/2011)  . Pleural effusion 2008    S/p decortication  . Insomnia   . Atrial fibrillation     s/p AV node ablation & BiV PPM implantation 09/08/11 (op dictation pending)  . Sinoatrial node dysfunction   . Headache   . Pulmonary HTN    Past Surgical History  Procedure Date  . Pleural scarification   . Lung decortication   . Colonoscopy 03/10/11    normal  . Pneumothorax with fibrothorax   . Insert / replace / remove pacemaker 09/08/11    pacemaker placement      Medication List     As of 07/04/2012 12:34 PM    STOP taking these medications         aspirin 81 MG chewable  tablet      warfarin 5 MG tablet   Commonly known as: COUMADIN      TAKE these medications         carvedilol 25 MG tablet   Commonly known as: COREG   Take 1.5 tablets (37.5 mg total) by mouth 2 (two) times daily with a meal.      dextromethorphan-guaiFENesin 30-600 MG per 12 hr tablet   Commonly known as: MUCINEX DM   Take 1 tablet by mouth as needed. For congestion      fluticasone 50 MCG/ACT nasal spray   Commonly known as: FLONASE   Place 1 spray into the nose daily.      furosemide 40 MG tablet   Commonly known as: LASIX   Take 1.5 tablets (60 mg total) by mouth 2 (two) times daily.      glucose blood test strip   Use as instructed      glyBURIDE 5 MG tablet   Commonly known as: DIABETA   Take 2 tablets (10 mg total) by mouth 2 (two) times daily with a meal.      losartan 100 MG tablet   Commonly known as: COZAAR   Take 1 tablet (100 mg total) by mouth daily.      multivitamins ther. w/minerals Tabs   Take 1 tablet by mouth daily.  niacin-lovastatin 1000-20 MG 24 hr tablet   Commonly known as: ADVICOR   Take 1 tablet by mouth at bedtime.      NON FORMULARY   Place into the nose daily. 2 liters of oxygen      pantoprazole 40 MG tablet   Commonly known as: PROTONIX   Take 40 mg by mouth daily as needed.      pantoprazole 40 MG tablet   Commonly known as: PROTONIX   Take 1 tablet (40 mg total) by mouth every morning.      sitaGLIPtin 100 MG tablet   Commonly known as: JANUVIA   Take 1 tablet (100 mg total) by mouth daily.      zolpidem 10 MG tablet   Commonly known as: AMBIEN   Take 1 tablet (10 mg total) by mouth at bedtime as needed. For sleep        LABORATORY STUDIES CBC    Component Value Date/Time   WBC 8.5 07/01/2012 1445   RBC 4.99 07/01/2012 1445   HGB 15.7 07/01/2012 1445   HCT 43.1 07/01/2012 1445   PLT 209 07/01/2012 1445   MCV 86.4 07/01/2012 1445   MCH 31.5 07/01/2012 1445   MCHC 36.4* 07/01/2012 1445   RDW 12.9 07/01/2012 1445     LYMPHSABS 1.3 07/01/2012 1445   MONOABS 1.0 07/01/2012 1445   EOSABS 0.0 07/01/2012 1445   BASOSABS 0.0 07/01/2012 1445   CMP    Component Value Date/Time   NA 136 07/01/2012 1445   K 4.1 07/01/2012 1445   CL 99 07/01/2012 1445   CO2 26 07/01/2012 1445   GLUCOSE 132* 07/01/2012 1445   GLUCOSE 118* 10/18/2006 0000   BUN 13 07/01/2012 1445   CREATININE 0.70 07/01/2012 1445   CALCIUM 9.0 07/01/2012 1445   PROT 7.6 07/01/2012 1445   ALBUMIN 3.8 07/01/2012 1445   AST 22 07/01/2012 1445   ALT 15 07/01/2012 1445   ALKPHOS 66 07/01/2012 1445   BILITOT 0.8 07/01/2012 1445   GFRNONAA >90 07/01/2012 1445   GFRAA >90 07/01/2012 1445   COAGS Lab Results  Component Value Date   INR 1.28 07/01/2012   INR 3.7 06/20/2012   INR 2.4 05/23/2012   PROTIME 18.7 03/06/2009  Lipid Panel    Component Value Date/Time   CHOL 157 07/02/2012 0510   TRIG 69 07/02/2012 0510   HDL 43 07/02/2012 0510   CHOLHDL 3.7 07/02/2012 0510   VLDL 14 07/02/2012 0510   LDLCALC 100* 07/02/2012 0510   HgbA1C  Lab Results  Component Value Date   HGBA1C 5.9* 07/02/2012   Cardiac Panel (last 3 results)  Basename 07/01/12 1445  CKTOTAL --  CKMB --  TROPONINI <0.30  RELINDX --   Urinalysis    Component Value Date/Time   COLORURINE YELLOW 07/01/2012 2027   APPEARANCEUR CLEAR 07/01/2012 2027   LABSPEC 1.020 07/01/2012 2027   PHURINE 6.5 07/01/2012 2027   GLUCOSEU NEGATIVE 07/01/2012 2027   HGBUR NEGATIVE 07/01/2012 2027   BILIRUBINUR NEGATIVE 07/01/2012 2027   KETONESUR NEGATIVE 07/01/2012 2027   PROTEINUR NEGATIVE 07/01/2012 2027   UROBILINOGEN 1.0 07/01/2012 2027   NITRITE NEGATIVE 07/01/2012 2027   LEUKOCYTESUR NEGATIVE 07/01/2012 2027   Urine Drug Screen  No results found for this basename: labopia, cocainscrnur, labbenz, amphetmu, thcu, labbarb    Alcohol Level No results found for this basename: eth   SIGNIFICANT DIAGNOSTIC STUDIES Abd 1 View 07/01/2012 Nonspecific nonobstructive bowel gas pattern. Moderate stool noted in the  right colon.  CT of the brain  07/02/2012 Blood within expanded right temporal horn unchanged. Mild surrounding vasogenic edema. Dependent interventricular blood. No new intracranial hemorrhage detected.  07/01/2012 Right temporal horn hematoma as detailed above with small amount of dependent blood within the occipital horn of the ventricles bilaterally. Prominent small vessel disease type changes without CT evidence of large acute thrombotic infarct.  CT Angio Head 07/02/2012 Hematoma is located within the mid and anterior right temporal horn which is expanded with surrounding vasogenic edema. I suspect that the hemorrhage is related to the choroid. There is slight prominence of the right anterior choroidal artery compared to the left. This may reflect the presence of underlying vascular malformation not well demonstrated on the present exam or secondary to blood supply of underlying choroidal mass. Attention to these possibilities on follow-up imaging recommended after hemorrhage has cleared. Small amount of subarachnoid blood is seen in the dependent aspect of the lateral ventricles. Left internal carotid artery is dilated just proximal to the petrous segment raising possibility fibromuscular dysplasia. Alternatively this may relate to atherosclerotic type changes.  MRI of the brain Pacer  MRA of the brain pacer  2D Echocardiogram EF 45-50% with no source of embolus.  Carotid Doppler No evidence of hemodynamically significant internal carotid artery stenosis. Vertebral artery flow is antegrade.  CXR 07/01/2012 No acute infiltrate or pulmonary edema. Stable chronic blunting of the costophrenic angles. Dual lead cardiac pacemaker is unchanged in position.  EKG RBBB, pacer.     History of Present Illness  [name] is a 71 y.o. male who first noticed mild right sided weakness approximately three weeks ago. Several days later, he began having headaches that have been persistent since that time. The  persistent headaches prompted him to seek medical attention 07/01/2102. In the ER, he had a head CT showing a right temporal hematoma. He states that he currently feels at his baseline other than a mild right weakness that he only notices in the morning. He currently feels that his breathing is good and is in no distress from this angle. Patient was not a TPA candidate secondary to hemorrhage. He was admitted to the neuro ICU for further evaluation and treatment.  Hospital Course  Patient's hemorrhage remained stable during the first 24h,  The highest risk for rebleeding. Hemorrhage was felt to be hypertensive in presence of coumadin treatment for afib, though BP not excessively elevated in hospital. Delay in arrival may have allowed normalization of BP post hemorrhage prior to arrival. Dr. Danielle Dess, neurosurgeon consulted and felt the intracerebral hemorrhage will likely resolve on its own without any surgical intervention. Patient was monitored in neuro ICU. Once stable, he was transferred to the floor.  Etiology of hemorrhage remains concerned. Dr. Pearlean Brownie recommends cerebral angio to look for source once hemorrhage resolves for better visualization. Will consider in the future at neuro followup.   Given hemorrhage, patient is no longer a coumadin or anticoagulant candidate. Can add low dose aspirin as hemorrhage resolves. Recommend f/u CT in 1 week.  Hypertension - resumed home meds Hyperlipidemia, LDL 100, on statin PTA. Continue at discharge. Diabetes, controlled. HgbA1c 5.9  Patients headaches resolved. Physical therapy, occupational therapy and speech therapy evaluated patient. They recommend no further therapy needs.   Discharge Exam  Blood pressure 136/69, pulse 70, temperature 97.6 F (36.4 C), temperature source Oral, resp. rate 19, height 5\' 9"  (1.753 m), weight 93.6 kg (206 lb 5.6 oz), SpO2 99.00%. Pleasant elderly caucasian male not in distress.Awake alert.  Afebrile. Head is nontraumatic.  Neck is supple without bruit. Hearing is normal. Cardiac exam no murmur or gallop. Lungs are clear to auscultation. Distal pulses are well felt.  Neurological Exam : Awake alert oriented x3 with normal speech and language function. Intact attention, registration and memory. Follows 3 step commands. Extraocular movements are full range without nystagmus. No visual field deficit and bedside confrontational exam. Fundi were not visualized. Visual acuity appears normal. Face is symmetric. Tongue is midline. Motor system exam reveals no upper or lower eczema to drift. Symmetric and equal strength in all 4 extremities. Touch and pinprick sensation intact. Coordination is normal. Gait was not tested.  Discharge Diet   Carb Control thin liquids  Discharge Plan    Disposition:  home with family  No aspirin or aspirin containing products at this time  Call Dr. Marlis Edelson office to arrange CT head in 1 week. If hemorrhage is stablizing, can add low dose aspirin for secondary stroke prevention   Follow-up [name], MD in 1 month.  Follow-up with Dr. Delia Heady in 2 months.  Signed Annie Main, AVNP, ANP-BC, [facility] Nurse Practitioner 07/04/2012, 12:34 PM  Dr. Delia Heady, Stroke Center Medical Director, has personally reviewed chart, pertinent data, examined the patient and developed the plan of care.

## 2012-07-04 NOTE — Care Management Note (Signed)
    Page 1 of 1   07/04/2012     4:47:22 PM   CARE MANAGEMENT NOTE 07/04/2012  Patient:  Bradley [name], Bradley [name]   Account Number:  [id number]  Date Initiated:  07/03/2012  Documentation initiated by:  Jacquelynn Cree  Subjective/Objective Assessment:   Admitted with headaches, rt temporal hematoma     Action/Plan:   PT/OT evals   Anticipated DC Date:  07/06/2012   Anticipated DC Plan:  HOME/SELF CARE      DC Planning Services  CM consult      Choice offered to / List presented to:             Status of service:  Completed, signed off Medicare Important Message given?   (If response is "NO", the following Medicare IM given date fields will be blank) Date Medicare IM given:   Date Additional Medicare IM given:    Discharge Disposition:  HOME/SELF CARE  Per UR Regulation:  Reviewed for med. necessity/level of care/duration of stay  If discussed at Long Length of Stay Meetings, dates discussed:    Comments:

## 2012-07-06 ENCOUNTER — Inpatient Hospital Stay (HOSPITAL_BASED_OUTPATIENT_CLINIC_OR_DEPARTMENT_OTHER)
Admission: EM | Admit: 2012-07-06 | Discharge: 2012-07-10 | DRG: 064 | Disposition: A | Discharge status: Home / Self Care | Payer: Medicare Other | Attending: Internal Medicine | Admitting: Internal Medicine

## 2012-07-06 ENCOUNTER — Encounter (HOSPITAL_BASED_OUTPATIENT_CLINIC_OR_DEPARTMENT_OTHER): Payer: Self-pay | Admitting: *Deleted

## 2012-07-06 ENCOUNTER — Emergency Department (HOSPITAL_BASED_OUTPATIENT_CLINIC_OR_DEPARTMENT_OTHER): Payer: Medicare Other

## 2012-07-06 DIAGNOSIS — Z0000 Encounter for general adult medical examination without abnormal findings: Secondary | ICD-10-CM

## 2012-07-06 DIAGNOSIS — I428 Other cardiomyopathies: Secondary | ICD-10-CM | POA: Diagnosis present

## 2012-07-06 DIAGNOSIS — I4891 Unspecified atrial fibrillation: Secondary | ICD-10-CM | POA: Diagnosis present

## 2012-07-06 DIAGNOSIS — I495 Sick sinus syndrome: Secondary | ICD-10-CM

## 2012-07-06 DIAGNOSIS — I5023 Acute on chronic systolic (congestive) heart failure: Secondary | ICD-10-CM

## 2012-07-06 DIAGNOSIS — E876 Hypokalemia: Secondary | ICD-10-CM

## 2012-07-06 DIAGNOSIS — I615 Nontraumatic intracerebral hemorrhage, intraventricular: Secondary | ICD-10-CM | POA: Diagnosis present

## 2012-07-06 DIAGNOSIS — R509 Fever, unspecified: Secondary | ICD-10-CM

## 2012-07-06 DIAGNOSIS — I5032 Chronic diastolic (congestive) heart failure: Secondary | ICD-10-CM

## 2012-07-06 DIAGNOSIS — E1169 Type 2 diabetes mellitus with other specified complication: Secondary | ICD-10-CM | POA: Diagnosis present

## 2012-07-06 DIAGNOSIS — R591 Generalized enlarged lymph nodes: Secondary | ICD-10-CM

## 2012-07-06 DIAGNOSIS — Z950 Presence of cardiac pacemaker: Secondary | ICD-10-CM

## 2012-07-06 DIAGNOSIS — G9340 Encephalopathy, unspecified: Secondary | ICD-10-CM

## 2012-07-06 DIAGNOSIS — I8310 Varicose veins of unspecified lower extremity with inflammation: Secondary | ICD-10-CM

## 2012-07-06 DIAGNOSIS — G4700 Insomnia, unspecified: Secondary | ICD-10-CM

## 2012-07-06 DIAGNOSIS — IMO0002 Reserved for concepts with insufficient information to code with codable children: Secondary | ICD-10-CM

## 2012-07-06 DIAGNOSIS — E785 Hyperlipidemia, unspecified: Secondary | ICD-10-CM | POA: Diagnosis present

## 2012-07-06 DIAGNOSIS — I2789 Other specified pulmonary heart diseases: Secondary | ICD-10-CM

## 2012-07-06 DIAGNOSIS — N400 Enlarged prostate without lower urinary tract symptoms: Secondary | ICD-10-CM

## 2012-07-06 DIAGNOSIS — R4182 Altered mental status, unspecified: Secondary | ICD-10-CM | POA: Diagnosis present

## 2012-07-06 DIAGNOSIS — G936 Cerebral edema: Secondary | ICD-10-CM

## 2012-07-06 DIAGNOSIS — I739 Peripheral vascular disease, unspecified: Secondary | ICD-10-CM

## 2012-07-06 DIAGNOSIS — F528 Other sexual dysfunction not due to a substance or known physiological condition: Secondary | ICD-10-CM

## 2012-07-06 DIAGNOSIS — E119 Type 2 diabetes mellitus without complications: Secondary | ICD-10-CM

## 2012-07-06 DIAGNOSIS — I10 Essential (primary) hypertension: Secondary | ICD-10-CM | POA: Diagnosis present

## 2012-07-06 DIAGNOSIS — Z7901 Long term (current) use of anticoagulants: Secondary | ICD-10-CM

## 2012-07-06 DIAGNOSIS — I482 Chronic atrial fibrillation, unspecified: Secondary | ICD-10-CM | POA: Diagnosis present

## 2012-07-06 DIAGNOSIS — I5041 Acute combined systolic (congestive) and diastolic (congestive) heart failure: Secondary | ICD-10-CM

## 2012-07-06 DIAGNOSIS — J9610 Chronic respiratory failure, unspecified whether with hypoxia or hypercapnia: Secondary | ICD-10-CM

## 2012-07-06 DIAGNOSIS — R634 Abnormal weight loss: Secondary | ICD-10-CM

## 2012-07-06 DIAGNOSIS — R972 Elevated prostate specific antigen [PSA]: Secondary | ICD-10-CM

## 2012-07-06 DIAGNOSIS — I5022 Chronic systolic (congestive) heart failure: Secondary | ICD-10-CM

## 2012-07-06 LAB — TROPONIN I: Troponin I: <0.3 ng/mL (ref <0.30)

## 2012-07-06 LAB — BASIC METABOLIC PANEL
BUN: 13 mg/dL (ref 6–23)
CO2: 27 mEq/L (ref 19–32)
Calcium: 8.9 mg/dL (ref 8.4–10.5)
Chloride: 95 mEq/L — ABNORMAL LOW (ref 96–112)
Creatinine, Ser: 0.8 mg/dL (ref 0.50–1.35)
GFR calc Af Amer: >90 mL/min (ref >90)
GFR calc non Af Amer: 88 mL/min — ABNORMAL LOW (ref >90)
Glucose, Bld: 140 mg/dL — ABNORMAL HIGH (ref 70–99)
Potassium: 4 mEq/L (ref 3.5–5.1)
Sodium: 134 mEq/L — ABNORMAL LOW (ref 135–145)

## 2012-07-06 LAB — PROTIME-INR
INR: 0.96 (ref 0.00–1.49)
Prothrombin Time: 12.7 seconds (ref 11.6–15.2)

## 2012-07-06 LAB — GLUCOSE, CAPILLARY
Glucose-Capillary: 152 mg/dL — ABNORMAL HIGH (ref 70–99)
Glucose-Capillary: 132 mg/dL — ABNORMAL HIGH (ref 70–99)
Glucose-Capillary: 152 mg/dL — ABNORMAL HIGH (ref 70–99)
Glucose-Capillary: 153 mg/dL — ABNORMAL HIGH (ref 70–99)

## 2012-07-06 LAB — CBC
HCT: 43 % (ref 39.0–52.0)
Hemoglobin: 15.9 g/dL (ref 13.0–17.0)
MCH: 31.5 pg (ref 26.0–34.0)
MCHC: 37 g/dL — ABNORMAL HIGH (ref 30.0–36.0)
MCV: 85.1 fL (ref 78.0–100.0)
Platelets: 196 10*3/uL (ref 150–400)
RBC: 5.05 MIL/uL (ref 4.22–5.81)
RDW: 12.8 % (ref 11.5–15.5)
WBC: 12.2 10*3/uL — ABNORMAL HIGH (ref 4.0–10.5)

## 2012-07-06 LAB — URINALYSIS, ROUTINE W REFLEX MICROSCOPIC
Bilirubin Urine: NEGATIVE
Glucose, UA: NEGATIVE mg/dL
Hgb urine dipstick: NEGATIVE
Ketones, ur: NEGATIVE mg/dL
Leukocytes, UA: NEGATIVE
Nitrite: NEGATIVE
Protein, ur: NEGATIVE mg/dL
Specific Gravity, Urine: 1.014 (ref 1.005–1.030)
Urobilinogen, UA: 1 mg/dL (ref 0.0–1.0)
pH: 6 (ref 5.0–8.0)

## 2012-07-06 LAB — LACTIC ACID, PLASMA: Lactic Acid, Venous: 1 mmol/L (ref 0.5–2.2)

## 2012-07-06 LAB — PROCALCITONIN: Procalcitonin: <0.1 ng/mL

## 2012-07-06 MED ORDER — SODIUM CHLORIDE 0.9 % IV SOLN
INTRAVENOUS | Status: AC
  Administered 2012-07-06: 1000 mL via INTRAVENOUS

## 2012-07-06 MED ORDER — LOSARTAN POTASSIUM 50 MG PO TABS
100.0000 mg | ORAL_TABLET | Freq: Every day | ORAL | Status: DC
  Administered 2012-07-06 – 2012-07-10 (×5): 100 mg via ORAL
  Filled 2012-07-06 (×5): qty 2

## 2012-07-06 MED ORDER — ACETAMINOPHEN 650 MG RE SUPP: 650.0000 mg | Freq: Four times a day (QID) | RECTAL | Status: DC | PRN

## 2012-07-06 MED ORDER — ALBUTEROL SULFATE (5 MG/ML) 0.5% IN NEBU: 2.5000 mg | INHALATION_SOLUTION | RESPIRATORY_TRACT | Status: DC | PRN

## 2012-07-06 MED ORDER — VANCOMYCIN HCL IN DEXTROSE 1-5 GM/200ML-% IV SOLN
1000.0000 mg | Freq: Once | INTRAVENOUS | Status: AC
  Administered 2012-07-06: 1000 mg via INTRAVENOUS
  Filled 2012-07-06 (×2): qty 200

## 2012-07-06 MED ORDER — SIMVASTATIN 10 MG PO TABS
10.0000 mg | ORAL_TABLET | Freq: Every day | ORAL | Status: DC
  Administered 2012-07-06 – 2012-07-09 (×4): 10 mg via ORAL
  Filled 2012-07-06 (×5): qty 1

## 2012-07-06 MED ORDER — DM-GUAIFENESIN ER 30-600 MG PO TB12
1.0000 | ORAL_TABLET | Freq: Two times a day (BID) | ORAL | Status: DC | PRN
  Filled 2012-07-06: qty 1

## 2012-07-06 MED ORDER — FUROSEMIDE 40 MG PO TABS
60.0000 mg | ORAL_TABLET | Freq: Two times a day (BID) | ORAL | Status: DC
  Administered 2012-07-06 – 2012-07-10 (×9): 60 mg via ORAL
  Filled 2012-07-06 (×11): qty 1

## 2012-07-06 MED ORDER — ADULT MULTIVITAMIN W/MINERALS CH
1.0000 | ORAL_TABLET | Freq: Every day | ORAL | Status: DC
  Administered 2012-07-06 – 2012-07-10 (×5): 1 via ORAL
  Filled 2012-07-06 (×5): qty 1

## 2012-07-06 MED ORDER — LINAGLIPTIN 5 MG PO TABS
5.0000 mg | ORAL_TABLET | Freq: Every day | ORAL | Status: DC
  Administered 2012-07-06 – 2012-07-10 (×5): 5 mg via ORAL
  Filled 2012-07-06 (×5): qty 1

## 2012-07-06 MED ORDER — PIPERACILLIN-TAZOBACTAM 3.375 G IVPB
3.3750 g | Freq: Once | INTRAVENOUS | Status: AC
  Administered 2012-07-06: 3.375 g via INTRAVENOUS
  Filled 2012-07-06: qty 50

## 2012-07-06 MED ORDER — SODIUM CHLORIDE 0.9 % IV SOLN
INTRAVENOUS | Status: DC
  Administered 2012-07-06: 08:00:00 via INTRAVENOUS

## 2012-07-06 MED ORDER — ACETAMINOPHEN 325 MG PO TABS
650.0000 mg | ORAL_TABLET | Freq: Four times a day (QID) | ORAL | Status: DC | PRN
  Administered 2012-07-06 – 2012-07-10 (×7): 650 mg via ORAL
  Filled 2012-07-06 (×8): qty 2
  Filled 2012-07-06: qty 1

## 2012-07-06 MED ORDER — CARVEDILOL 25 MG PO TABS
37.5000 mg | ORAL_TABLET | Freq: Two times a day (BID) | ORAL | Status: DC
  Administered 2012-07-06 – 2012-07-10 (×9): 37.5 mg via ORAL
  Filled 2012-07-06 (×11): qty 1

## 2012-07-06 MED ORDER — GLYBURIDE 5 MG PO TABS
10.0000 mg | ORAL_TABLET | Freq: Two times a day (BID) | ORAL | Status: DC
  Administered 2012-07-06 – 2012-07-10 (×9): 10 mg via ORAL
  Filled 2012-07-06 (×11): qty 2

## 2012-07-06 MED ORDER — SODIUM CHLORIDE 0.9 % IJ SOLN
3.0000 mL | Freq: Two times a day (BID) | INTRAMUSCULAR | Status: DC
  Administered 2012-07-07 – 2012-07-10 (×6): 3 mL via INTRAVENOUS

## 2012-07-06 MED ORDER — NIACIN ER 500 MG PO CPCR
1000.0000 mg | ORAL_CAPSULE | Freq: Every day | ORAL | Status: DC
  Administered 2012-07-06 – 2012-07-09 (×4): 1000 mg via ORAL
  Filled 2012-07-06 (×5): qty 2

## 2012-07-06 MED ORDER — ALUM & MAG HYDROXIDE-SIMETH 200-200-20 MG/5ML PO SUSP: 30.0000 mL | Freq: Four times a day (QID) | ORAL | Status: DC | PRN

## 2012-07-06 MED ORDER — NIACIN-LOVASTATIN ER 1000-20 MG PO TB24: 1.0000 | ORAL_TABLET | Freq: Every day | ORAL | Status: DC

## 2012-07-06 MED ORDER — VANCOMYCIN HCL IN DEXTROSE 1-5 GM/200ML-% IV SOLN
1000.0000 mg | Freq: Three times a day (TID) | INTRAVENOUS | Status: DC
  Administered 2012-07-06 – 2012-07-07 (×4): 1000 mg via INTRAVENOUS
  Filled 2012-07-06 (×5): qty 200

## 2012-07-06 MED ORDER — FLUTICASONE PROPIONATE 50 MCG/ACT NA SUSP
1.0000 | Freq: Every day | NASAL | Status: DC
  Administered 2012-07-10: 1 via NASAL
  Filled 2012-07-06: qty 16

## 2012-07-06 MED ORDER — ACETAMINOPHEN 325 MG PO TABS
650.0000 mg | ORAL_TABLET | Freq: Once | ORAL | Status: AC
  Administered 2012-07-06: 650 mg via ORAL
  Filled 2012-07-06: qty 2

## 2012-07-06 MED ORDER — PANTOPRAZOLE SODIUM 40 MG PO TBEC
40.0000 mg | DELAYED_RELEASE_TABLET | Freq: Every day | ORAL | Status: DC
  Administered 2012-07-07 – 2012-07-10 (×4): 40 mg via ORAL
  Filled 2012-07-06 (×4): qty 1

## 2012-07-06 MED ORDER — DEXTROSE 5 % IV SOLN
1.0000 g | Freq: Three times a day (TID) | INTRAVENOUS | Status: DC
  Administered 2012-07-06 – 2012-07-07 (×4): 1 g via INTRAVENOUS
  Filled 2012-07-06 (×6): qty 1

## 2012-07-06 MED ORDER — ONDANSETRON HCL 4 MG PO TABS: 4.0000 mg | ORAL_TABLET | Freq: Four times a day (QID) | ORAL | Status: DC | PRN

## 2012-07-06 MED ORDER — INSULIN ASPART 100 UNIT/ML ~~LOC~~ SOLN
0.0000 [IU] | Freq: Three times a day (TID) | SUBCUTANEOUS | Status: DC
  Administered 2012-07-06 – 2012-07-07 (×3): 2 [IU] via SUBCUTANEOUS
  Administered 2012-07-07: 1 [IU] via SUBCUTANEOUS
  Administered 2012-07-08: 2 [IU] via SUBCUTANEOUS
  Administered 2012-07-08: 1 [IU] via SUBCUTANEOUS
  Administered 2012-07-09 (×2): 2 [IU] via SUBCUTANEOUS
  Administered 2012-07-09: 1 [IU] via SUBCUTANEOUS
  Administered 2012-07-10: 2 [IU] via SUBCUTANEOUS

## 2012-07-06 MED ORDER — THERA M PLUS PO TABS: 1.0000 | ORAL_TABLET | Freq: Every day | ORAL | Status: DC

## 2012-07-06 MED ORDER — ONDANSETRON HCL 4 MG/2ML IJ SOLN: 4.0000 mg | Freq: Four times a day (QID) | INTRAMUSCULAR | Status: DC | PRN

## 2012-07-06 NOTE — Progress Notes (Signed)
CRITICAL VALUE ALERT  Critical value received:  Blood Culture Positive for Anaerobic Gram + Rods  Date of notification:  07/06/2012  Time of notification:  2100  Critical value read back:yes  Nurse who received alert:  Beryle Quant, RN  MD notified (1st page):  L. Easterwood of [facility]  Time of first page:  2107  MD notified (2nd page):  Time of second page:  Responding MD:  Coralie Carpen of [facility]  Time MD responded: L Easterwood of [facility] (no orders received, pt already on antibiotics)

## 2012-07-06 NOTE — ED Notes (Signed)
Pt recently admitted to Neuro ICU for intracranial Hemorrhage. Discharged 07/04/12 to home. Pt has had increased confusion, generalized pain, and weakness since returning home.

## 2012-07-06 NOTE — ED Provider Notes (Signed)
History     CSN: [id number]  Arrival date & time 07/06/12  0236   First MD Initiated Contact with Patient 07/06/12 0251      Chief Complaint  Patient presents with  . Weakness    (Consider location/radiation/quality/duration/timing/severity/associated sxs/prior treatment) HPI Hx per wife. AMS today at home, was doing after discharge from the hospital 2 days ago for intraparenchymal hemorrhage.  He was taken off of coumadin. He has complained earlier in the day from his toes to his head. Later on he fell without head or neck injry and no apparent trauma - this is out of the ordinary for him. At 3am wife found him in the kitchen trying to open a canned ham that he wanted to cook.  Again this was abnormal for him, he usually does not cook or get up in the middle of the night to eat. She called 911 and was brought in by EMS. PT denies any complaints - is a very poor historian found to be altered and febrile. Level 5 caveat applies Past Medical History  Diagnosis Date  . HTN (hypertension)   . HLD (hyperlipidemia)   . DM (diabetes mellitus) 1999    adult onset  . BPH (benign prostatic hyperplasia)     Saw Dr [name] 2004, normal renal u/s  . Leg ulcer 2011  . CHF (congestive heart failure)     Thought primarily to be non-systolic although EF down (EF 14-78% 12/2010, down to 35-40% 09/05/11), cath 2008 with no CAD, nuclear study 07/2011 showing Small area of reversibility in the distal ant/lat wall the left ventricle suspicious for ischemia/septal wall HK but felt to be low risk  (per D/C Summary 07/2011)  . Pleural effusion 2008    S/p decortication  . Insomnia   . Atrial fibrillation     s/p AV node ablation & BiV PPM implantation 09/08/11 (op dictation pending)  . Sinoatrial node dysfunction   . Headache   . Pulmonary HTN     Past Surgical History  Procedure Date  . Pleural scarification   . Lung decortication   . Colonoscopy 03/10/11    normal  . Pneumothorax with  fibrothorax   . Insert / replace / remove pacemaker 09/08/11    pacemaker placement    Family History  Problem Relation Age of Onset  . Diabetes Father   . Breast cancer Maternal Aunt   . Heart attack Neg Hx   . Prostate cancer Neg Hx   . Colon cancer Neg Hx   . Cancer Neg Hx     colon or prostate  . Coronary artery disease Father   . Polycythemia Mother     History  Substance Use Topics  . Smoking status: Former Smoker -- 2.5 packs/day for 25 years    Types: Cigarettes    Quit date: 10/11/1980  . Smokeless tobacco: Never Used  . Alcohol Use: Yes     occassionally once a year      Review of Systems  Unable to perform ROS level 5 caveat applies  Allergies  Avelox and Tadalafil  Home Medications   Current Outpatient Rx  Name Route Sig Dispense Refill  . CARVEDILOL 25 MG PO TABS Oral Take 1.5 tablets (37.5 mg total) by mouth 2 (two) times daily with a meal. 270 tablet 3  . DM-GUAIFENESIN ER 30-600 MG PO TB12 Oral Take 1 tablet by mouth as needed. For congestion    . FLUTICASONE PROPIONATE 50 MCG/ACT NA SUSP Nasal Place 1  spray into the nose daily.     . FUROSEMIDE 40 MG PO TABS Oral Take 1.5 tablets (60 mg total) by mouth 2 (two) times daily. 270 tablet 3  . GLUCOSE BLOOD VI STRP  Use as instructed 100 each 12    Check once daily. Dx. Diabetes Mellitus 250.0  . GLYBURIDE 5 MG PO TABS Oral Take 2 tablets (10 mg total) by mouth 2 (two) times daily with a meal. 360 tablet 3  . LOSARTAN POTASSIUM 100 MG PO TABS Oral Take 1 tablet (100 mg total) by mouth daily. 90 tablet 3  . THERA M PLUS PO TABS Oral Take 1 tablet by mouth daily.      Marland Kitchen NIACIN-LOVASTATIN ER 1000-20 MG PO TB24 Oral Take 1 tablet by mouth at bedtime. 90 tablet 3  . NON FORMULARY Nasal Place into the nose daily. 2 liters of oxygen    . PANTOPRAZOLE SODIUM 40 MG PO TBEC Oral Take 1 tablet (40 mg total) by mouth every morning. 90 tablet 3  . PANTOPRAZOLE SODIUM 40 MG PO TBEC Oral Take 40 mg by mouth daily as  needed.    Marland Kitchen SITAGLIPTIN PHOSPHATE 100 MG PO TABS Oral Take 1 tablet (100 mg total) by mouth daily. 90 tablet 3  . ZOLPIDEM TARTRATE 10 MG PO TABS Oral Take 1 tablet (10 mg total) by mouth at bedtime as needed. For sleep 90 tablet 1    BP 179/73  Pulse 70  Temp 98.6 F (37 C) (Oral)  Resp 20  SpO2 9%  Physical Exam  Constitutional: He appears well-developed and well-nourished.  HENT:  Head: Normocephalic and atraumatic.  Eyes: Conjunctivae normal and EOM are normal. Pupils are equal, round, and reactive to light.  Neck: Full passive range of motion without pain. Neck supple. No thyromegaly present.       No meningismus  Cardiovascular: Normal rate, regular rhythm, S1 normal, S2 normal and intact distal pulses.   Pulmonary/Chest: Effort normal and breath sounds normal.  Abdominal: Soft. Bowel sounds are normal. There is no tenderness. There is no CVA tenderness.  Musculoskeletal: Normal range of motion.  Neurological: He is alert. He has normal strength and normal reflexes. No cranial nerve deficit or sensory deficit. He displays a negative Romberg sign. GCS eye subscore is 4. GCS verbal subscore is 5. GCS motor subscore is 6.       Believes it is November and the president is "Sports administrator". No extremity deficits.   Skin: Skin is warm and dry. No rash noted. No cyanosis. Nails show no clubbing.    ED Course  Procedures (including critical care time)  Results for orders placed during the hospital encounter of 07/06/12  CBC      Component Value Range   WBC 12.2 (*) 4.0 - 10.5 K/uL   RBC 5.05  4.22 - 5.81 MIL/uL   Hemoglobin 15.9  13.0 - 17.0 g/dL   HCT 45.4  09.8 - 11.9 %   MCV 85.1  78.0 - 100.0 fL   MCH 31.5  26.0 - 34.0 pg   MCHC 37.0 (*) 30.0 - 36.0 g/dL   RDW 14.7  82.9 - 56.2 %   Platelets 196  150 - 400 K/uL  BASIC METABOLIC PANEL      Component Value Range   Sodium 134 (*) 135 - 145 mEq/L   Potassium 4.0  3.5 - 5.1 mEq/L   Chloride 95 (*) 96 - 112 mEq/L   CO2 27  19 -  32  mEq/L   Glucose, Bld 140 (*) 70 - 99 mg/dL   BUN 13  6 - 23 mg/dL   Creatinine, Ser 1.61  0.50 - 1.35 mg/dL   Calcium 8.9  8.4 - 09.6 mg/dL   GFR calc non Af Amer 88 (*) >90 mL/min   GFR calc Af Amer >90  >90 mL/min  PROTIME-INR      Component Value Range   Prothrombin Time 12.7  11.6 - 15.2 seconds   INR 0.96  0.00 - 1.49  URINALYSIS, ROUTINE W REFLEX MICROSCOPIC      Component Value Range   Color, Urine YELLOW  YELLOW   APPearance CLEAR  CLEAR   Specific Gravity, Urine 1.014  1.005 - 1.030   pH 6.0  5.0 - 8.0   Glucose, UA NEGATIVE  NEGATIVE mg/dL   Hgb urine dipstick NEGATIVE  NEGATIVE   Bilirubin Urine NEGATIVE  NEGATIVE   Ketones, ur NEGATIVE  NEGATIVE mg/dL   Protein, ur NEGATIVE  NEGATIVE mg/dL   Urobilinogen, UA 1.0  0.0 - 1.0 mg/dL   Nitrite NEGATIVE  NEGATIVE   Leukocytes, UA NEGATIVE  NEGATIVE  TROPONIN I      Component Value Range   Troponin I <0.30  <0.30 ng/mL    Ct Head Wo Contrast  07/06/2012  *RADIOLOGY REPORT*  Clinical Data: Generalized weakness.  Headache.  Short of breath. History of hematoma.  CT HEAD WITHOUT CONTRAST  Technique:  Contiguous axial images were obtained from the base of the skull through the vertex without contrast.  Comparison: 07/02/2012.  Findings: Expected evolution of posterior right temporal horn hemorrhage.  Intraventricular blood remains present, layering dependently in the lateral ventricles.  Ventricular size is unchanged compared to prior exam.  Vasogenic edema around the hemorrhage appears similar.  Unchanged left basal ganglia lacunar infarct.  No midline shift.  No new hemorrhages identified.  No evidence of acute infarction.  Small fluid level or mucosal thickening is present in the left maxillary sinus, similar to prior.  Fluid in the right mastoid air cells.  IMPRESSION: Expected evolution of right temporal horn intraventricular hemorrhage with dependently layering intraventricular blood.   Original Report Authenticated By:  Andreas Newport, M.D.      Dg Chest Portable 1 View  07/06/2012  *RADIOLOGY REPORT*  Clinical Data: Weakness.Short of breath.  Hypertension.  PORTABLE CHEST - 1 VIEW  Comparison: 07/01/2012.  Findings: Cardiomegaly appears similar to the prior exam.  Dual lead left subclavian cardiac pacemaker with lead position unchanged.  Pulmonary vascular congestion is present.  Interstitial pulmonary edema.  Unchanged blunting of the right costophrenic angle and left pleural thickening.  IMPRESSION: Cardiomegaly with pulmonary vascular congestion and interstitial pulmonary edema.  Findings compatible with mild CHF.   Original Report Authenticated By: Andreas Newport, M.D.      Date: 07/06/2012  Rate: 70  Rhythm: paced  QRS Axis: left  Intervals: paced  ST/T Wave abnormalities: nonspecific ST/T changes  Conduction Disutrbances:nonspecific intraventricular conduction delay  Narrative Interpretation:   Old EKG Reviewed: unchanged  IVFs and tylenol for fever.  Work up as above, improving head bleed, INR WNL. No obvious source for fever, no meningismus. No ABD tenderness.  IV ABx.  4:56 AM d/w MED Dr Toniann Fail who accepts PT in [place], requesting BCs and lactate be sent at this time.  MDM   AMS in elderly male with recent head bleed.  Is now febrile without obvious source. UA and CXR as above. No cough or obvious PNA. No ABD  tenderness, no rash. Labs reviewed. Iv ABX MEd c/s and admit step down ICU. Transferred to [facility] - accepted by Dr Lita Mains, MD 07/06/12 [phone]

## 2012-07-06 NOTE — Progress Notes (Signed)
Paged Attending Service regarding pt's SBP>160. No orders received. Will continue to monitor.

## 2012-07-06 NOTE — Progress Notes (Signed)
Utilization review completed.  

## 2012-07-06 NOTE — ED Notes (Signed)
Pt from home. Complaining of generalized weakness. Was at [facility] yesterday for same complaint. States this episode came on approximately 50 minutes prior to arrival. Pt is O2 dependent with 2L of O2. VS: BP 14/88, heart rate 74 paced, 99% on 3L of O2. CBG 109. Pt alert and oriented x 4. Pt reports falling out of bed this am. Denies any pain from the fall.

## 2012-07-06 NOTE — H&P (Signed)
PATIENT DETAILS Name: Bradley [name] Age: 71 y.o. Sex: male Date of Birth: March 23, 1941 Admit Date: 07/06/2012 ZOX:WRUE Paz, MD   CHIEF COMPLAINT:  Altered mental status  HPI: Patient is a 71 year old Caucasian male who was just discharged from this hospital a few days ago after having a intra-parenchymal hemorrhage, he was brought back to the hospital for evaluation of the above-noted complaints. Please note that this history is collected after reviewing the ED physician's note, speaking with the patient and also speaking with the patient's wife over the phone. Per wife ever since discharge, she has been sleeping more than usual. The night before, patient was noted to be cooking ham around 1:00 am in the morning, per patient, he thought it was already morning and was trying to help his wife how. Per patient's wife, she thought he just got confused with time and did not seek medical attention then. Early this morning, patient while getting out of bed,patient fell and could not get up right away. He refused assistance from his wife. He, around 10 minutes later slowly got up by himself. He was then noted to be talking about death and not " living like this", patient's wife got concerned, she called the EMS and the patient was then brought to [facility] for further evaluation. He was found to have a fever, and somewhat confused. Chest x-ray was negative for pneumonia, urinalysis was negative for UTI, he did have leukocytosis, he was treated with vancomycin and Zosyn in the emergency room, subsequently he was transferred to [facility] for admission for further evaluation and treatment. During my evaluation, patient is sleepy-as he has hardly slept last night, however seems to be very much alert and answers all my questions appropriately. He denies any ongoing chest pain or shortness of breath. He denies any history of diarrhea. He denies any history of chest pain. He denies any dysuria or  polyuria or hematuria. He has occasional headaches, however does not have headache during my interview. He denies any photophobia. He denies any neck pain. He denies any abdominal pain. Apart from the above noted history, patient's spouse did not notice anything unusual for the patient. She in fact checked his temperature while at home, and the patient was afebrile.   ALLERGIES:   Allergies  Allergen Reactions  . Avelox (Moxifloxacin Hydrochloride) Itching    Headache  . Tadalafil     REACTION: HA, backache    PAST MEDICAL HISTORY: Past Medical History  Diagnosis Date  . HTN (hypertension)   . HLD (hyperlipidemia)   . DM (diabetes mellitus) 1999    adult onset  . BPH (benign prostatic hyperplasia)     Saw Dr [name] 2004, normal renal u/s  . Leg ulcer 2011  . CHF (congestive heart failure)     Thought primarily to be non-systolic although EF down (EF 45-40% 12/2010, down to 35-40% 09/05/11), cath 2008 with no CAD, nuclear study 07/2011 showing Small area of reversibility in the distal ant/lat wall the left ventricle suspicious for ischemia/septal wall HK but felt to be low risk  (per D/C Summary 07/2011)  . Pleural effusion 2008    S/p decortication  . Insomnia   . Atrial fibrillation     s/p AV node ablation & BiV PPM implantation 09/08/11 (op dictation pending)  . Sinoatrial node dysfunction   . Headache   . Pulmonary HTN     PAST SURGICAL HISTORY: Past Surgical History  Procedure Date  . Pleural scarification   .  Lung decortication   . Colonoscopy 03/10/11    normal  . Pneumothorax with fibrothorax   . Insert / replace / remove pacemaker 09/08/11    pacemaker placement    MEDICATIONS AT HOME: Prior to Admission medications   Medication Sig Start Date End Date Taking? Authorizing Provider  carvedilol (COREG) 25 MG tablet Take 1.5 tablets (37.5 mg total) by mouth 2 (two) times daily with a meal. 05/08/12 05/08/13  [name], MD  dextromethorphan-guaiFENesin  [facility] DM) 30-600 MG per 12 hr tablet Take 1 tablet by mouth as needed. For congestion    Historical Provider, MD  fluticasone (FLONASE) 50 MCG/ACT nasal spray Place 1 spray into the nose daily.  08/24/11 08/23/12  [name], MD  furosemide (LASIX) 40 MG tablet Take 1.5 tablets (60 mg total) by mouth 2 (two) times daily. 05/08/12   [name], MD  glucose blood (ONE TOUCH ULTRA TEST) test strip Use as instructed 05/08/12 05/08/13  [name], MD  glyBURIDE (DIABETA) 5 MG tablet Take 2 tablets (10 mg total) by mouth 2 (two) times daily with a meal. 05/08/12   [name], MD  losartan (COZAAR) 100 MG tablet Take 1 tablet (100 mg total) by mouth daily. 05/08/12   [name], MD  Multiple Vitamins-Minerals (MULTIVITAMINS THER. W/MINERALS) TABS Take 1 tablet by mouth daily.      Historical Provider, MD  niacin-lovastatin (ADVICOR) 1000-20 MG 24 hr tablet Take 1 tablet by mouth at bedtime. 05/08/12   [name], MD  NON FORMULARY Place into the nose daily. 2 liters of oxygen    Historical Provider, MD  pantoprazole (PROTONIX) 40 MG tablet Take 1 tablet (40 mg total) by mouth every morning. 05/08/12   [name], MD  pantoprazole (PROTONIX) 40 MG tablet Take 40 mg by mouth daily as needed. 10/01/11   [name], MD  sitaGLIPtin (JANUVIA) 100 MG tablet Take 1 tablet (100 mg total) by mouth daily. 05/08/12   [name], MD  zolpidem (AMBIEN) 10 MG tablet Take 1 tablet (10 mg total) by mouth at bedtime as needed. For sleep 05/08/12   [name], MD    FAMILY HISTORY: Family History  Problem Relation Age of Onset  . Diabetes Father   . Breast cancer Maternal Aunt   . Heart attack Neg Hx   . Prostate cancer Neg Hx   . Colon cancer Neg Hx   . Cancer Neg Hx     colon or prostate  . Coronary artery disease Father   . Polycythemia Mother     SOCIAL HISTORY:  reports that he quit smoking about 31 years ago. His smoking use included Cigarettes. He has a 62.5 pack-year smoking history. He has never used  smokeless tobacco. He reports that he drinks alcohol. He reports that he does not use illicit drugs.  REVIEW OF SYSTEMS:  Constitutional:   No  weight loss, night sweats,  chills, fatigue.+Fever  HEENT:    No headaches, Difficulty swallowing,Tooth/dental problems,Sore throat,  No sneezing, itching, ear ache, nasal congestion, post nasal drip,   Cardio-vascular: No chest pain,  Orthopnea, PND, swelling in lower extremities, anasarca,  dizziness, palpitations  GI:  No heartburn, indigestion, abdominal pain, nausea, vomiting, diarrhea, change inbowel habits, loss of appetite  Resp: No shortness of breath with exertion or at rest.  No excess mucus, no productive cough, No non-productive cough,  No coughing up of blood.No change in color of mucus.No  wheezing.No chest wall deformity  Skin:  no rash or lesions.  GU:  no dysuria, change in color of urine, no urgency or frequency.  No flank pain.  Musculoskeletal: No joint pain or swelling.  No decreased range of motion.  No back pain.  Psych: No change in mood or affect. No depression or anxiety.  No memory loss.   PHYSICAL EXAM: Blood pressure 173/89, pulse 72, temperature 98.3 F (36.8 C), temperature source Oral, resp. rate 21, height 5\' 11"  (1.803 m), weight 94.6 kg (208 lb 8.9 oz), SpO2 98.00%.  General appearance :Awake, alert, not in any distress. Speech Clear. Not toxic Looking HEENT: Atraumatic and Normocephalic, pupils equally reactive to light and accomodation Neck: supple, no JVD. No cervical lymphadenopathy.  Chest:Good air entry bilaterally, no added sounds  CVS: S1 S2 regular, no murmurs.  Abdomen: Bowel sounds present, Non tender and not distended with no gaurding, rigidity or rebound. Extremities: B/L Lower Ext shows no edema, both legs are warm to touch, with  dorsalis pedis pulses palpable. Has chronic venous stasis changes bilaterally. Neurology: Awake alert, and oriented X 3, CN II-XII intact, Non focal-with  adequate 5/5 strength in all of his 4 extremities.  Skin:No Rash Wounds:N/A  LABS ON ADMISSION:   Basename 07/06/12 0353  NA 134*  K 4.0  CL 95*  CO2 27  GLUCOSE 140*  BUN 13  CREATININE 0.80  CALCIUM 8.9  MG --  PHOS --   No results found for this basename: AST:2,ALT:2,ALKPHOS:2,BILITOT:2,PROT:2,ALBUMIN:2 in the last 72 hours No results found for this basename: LIPASE:2,AMYLASE:2 in the last 72 hours  Basename 07/06/12 0353  WBC 12.2*  NEUTROABS --  HGB 15.9  HCT 43.0  MCV 85.1  PLT 196    Basename 07/06/12 0353  CKTOTAL --  CKMB --  CKMBINDEX --  TROPONINI <0.30   No results found for this basename: DDIMER:2 in the last 72 hours No components found with this basename: POCBNP:3   RADIOLOGIC STUDIES ON ADMISSION: Ct Head Wo Contrast  07/06/2012  *RADIOLOGY REPORT*  Clinical Data: Generalized weakness.  Headache.  Short of breath. History of hematoma.  CT HEAD WITHOUT CONTRAST  Technique:  Contiguous axial images were obtained from the base of the skull through the vertex without contrast.  Comparison: 07/02/2012.  Findings: Expected evolution of posterior right temporal horn hemorrhage.  Intraventricular blood remains present, layering dependently in the lateral ventricles.  Ventricular size is unchanged compared to prior exam.  Vasogenic edema around the hemorrhage appears similar.  Unchanged left basal ganglia lacunar infarct.  No midline shift.  No new hemorrhages identified.  No evidence of acute infarction.  Small fluid level or mucosal thickening is present in the left maxillary sinus, similar to prior.  Fluid in the right mastoid air cells.  IMPRESSION: Expected evolution of right temporal horn intraventricular hemorrhage with dependently layering intraventricular blood.   Original Report Authenticated By: Andreas Newport, M.D.    Dg Chest Portable 1 View  07/06/2012  *RADIOLOGY REPORT*  Clinical Data: Weakness.Short of breath.  Hypertension.  PORTABLE CHEST - 1  VIEW  Comparison: 07/01/2012.  Findings: Cardiomegaly appears similar to the prior exam.  Dual lead left subclavian cardiac pacemaker with lead position unchanged.  Pulmonary vascular congestion is present.  Interstitial pulmonary edema.  Unchanged blunting of the right costophrenic angle and left pleural thickening.  IMPRESSION: Cardiomegaly with pulmonary vascular congestion and interstitial pulmonary edema.  Findings compatible with mild CHF.   Original Report Authenticated By: Andreas Newport, M.D.  ASSESSMENT AND PLAN: Present on Admission:  .Altered mental status -With fever, suspicion for an occult infection causing encephalopathy, however no obvious source of infection apparent on physical exam or from laboratory/radiological studies.  -In any event, his altered mental status seems to have resolved by the time of my evaluation, a CT of the head done in the emergency room shows expected evolution of the right temporal horn intraventricular hemorrhage.  -For now, we'll empirically treat with vancomycin and cefepime, await  blood culture results that were drawn in the emergency room. Monitor closely and follow clinical course. -His neck is supple, he does not have any photophobia or any Headache to suggest a meningeal process. - I was not sure whether he is on any benzodiazepines or narcotics which could have transiently caused him to have altered mental status, I will send out a urinary drug screen.  . Fever -As above -If no infectious etiology is apparent over the next few days, this fever could also be coming from his residual brain hemorrhage -If he were to continue having fever without any apparent source of infection, we will then also need infectious disease consultation -For now as noted above, monitor and follow clinical course closely.  Marland KitchenPermanent atrial fibrillation -No longer a Coumadin candidate given recent intra-cranial hemorrhage  -Per neurologically discharge summary-to  start aspirin in a week after repeating a CT of the head   .nonischemic cardiomyopathy -As chronic systolic heart failure, currently this is compensated   . Recent Intraventricular hemorrhage -CT of the head done in the emergency room shows expected evolution of the intracranial hemorrhage  -Will need to followup with neurology   .HYPERLIPIDEMIA -Continue with statins  .HYPERTENSION -We'll resume his usual antihypertensive medications for now, will monitor his BP readings closely and adjust medications as necessary.   .Diabetes mellitus -Continue with oral hypoglycemics, add SSI. Further adjustments will be done depending on CBG readings.  Further plan will depend as patient's clinical course evolves and further radiologic and laboratory data become available. Patient will be monitored closely. I've discussed the above mentioned outline/plan with the patient's spouse Ms. Coltrane Tugwell over the phone.   DVT Prophylaxis: -SCDs  Code Status: -Full code  Total time spent for admission equals 45 minutes.  [facility] Pager [phone]  If 7PM-7AM, please contact night-coverage [email or web address] Password [facility] 07/06/2012, 7:40 AM

## 2012-07-06 NOTE — Progress Notes (Signed)
ANTIBIOTIC CONSULT NOTE - INITIAL  Pharmacy Consult for Vancocin/Maxipime Indication: unclear source of infection  Allergies  Allergen Reactions  . Avelox (Moxifloxacin Hydrochloride) Itching    Headache  . Tadalafil     REACTION: HA, backache    Patient Measurements: Height: 5\' 11"  (180.3 cm) Weight: 208 lb 8.9 oz (94.6 kg) IBW/kg (Calculated) : 75.3   Vital Signs: Temp: 98.3 F (36.8 C) (09/26 0725) Temp src: Oral (09/26 0655) BP: 173/89 mmHg (09/26 0655) Pulse Rate: 72  (09/26 0655)  Labs:  Basename 07/06/12 0353  WBC 12.2*  HGB 15.9  PLT 196  LABCREA --  CREATININE 0.80   Estimated Creatinine Clearance: 99.4 ml/min (by C-G formula based on Cr of 0.8).  Microbiology: Recent Results (from the past 720 hour(s))  MRSA PCR SCREENING     Status: Normal   Collection Time   07/01/12 11:00 PM      Component Value Range Status Comment   MRSA by PCR NEGATIVE  NEGATIVE Final     Medical History: Past Medical History  Diagnosis Date  . HTN (hypertension)   . HLD (hyperlipidemia)   . DM (diabetes mellitus) 1999    adult onset  . BPH (benign prostatic hyperplasia)     Saw Dr [name] 2004, normal renal u/s  . Leg ulcer 2011  . CHF (congestive heart failure)     Thought primarily to be non-systolic although EF down (EF 46-96% 12/2010, down to 35-40% 09/05/11), cath 2008 with no CAD, nuclear study 07/2011 showing Small area of reversibility in the distal ant/lat wall the left ventricle suspicious for ischemia/septal wall HK but felt to be low risk  (per D/C Summary 07/2011)  . Pleural effusion 2008    S/p decortication  . Insomnia   . Atrial fibrillation     s/p AV node ablation & BiV PPM implantation 09/08/11 (op dictation pending)  . Sinoatrial node dysfunction   . Headache   . Pulmonary HTN     Medications:  Prescriptions prior to admission  Medication Sig Dispense Refill  . carvedilol (COREG) 25 MG tablet Take 1.5 tablets (37.5 mg total) by mouth 2 (two)  times daily with a meal.  270 tablet  3  . dextromethorphan-guaiFENesin (MUCINEX DM) 30-600 MG per 12 hr tablet Take 1 tablet by mouth as needed. For congestion      . fluticasone (FLONASE) 50 MCG/ACT nasal spray Place 1 spray into the nose daily.       . furosemide (LASIX) 40 MG tablet Take 1.5 tablets (60 mg total) by mouth 2 (two) times daily.  270 tablet  3  . glucose blood (ONE TOUCH ULTRA TEST) test strip Use as instructed  100 each  12  . glyBURIDE (DIABETA) 5 MG tablet Take 2 tablets (10 mg total) by mouth 2 (two) times daily with a meal.  360 tablet  3  . losartan (COZAAR) 100 MG tablet Take 1 tablet (100 mg total) by mouth daily.  90 tablet  3  . Multiple Vitamins-Minerals (MULTIVITAMINS THER. W/MINERALS) TABS Take 1 tablet by mouth daily.        . niacin-lovastatin (ADVICOR) 1000-20 MG 24 hr tablet Take 1 tablet by mouth at bedtime.  90 tablet  3  . NON FORMULARY Place into the nose daily. 2 liters of oxygen      . pantoprazole (PROTONIX) 40 MG tablet Take 1 tablet (40 mg total) by mouth every morning.  90 tablet  3  . pantoprazole (PROTONIX) 40 MG tablet  Take 40 mg by mouth daily as needed.      . sitaGLIPtin (JANUVIA) 100 MG tablet Take 1 tablet (100 mg total) by mouth daily.  90 tablet  3  . zolpidem (AMBIEN) 10 MG tablet Take 1 tablet (10 mg total) by mouth at bedtime as needed. For sleep  90 tablet  1   Scheduled:    . sodium chloride   Intravenous STAT  . acetaminophen  650 mg Oral Once  . carvedilol  37.5 mg Oral BID WC  . ceFEPime (MAXIPIME) IV  1 g Intravenous Q8H  . fluticasone  1 spray Each Nare Daily  . furosemide  60 mg Oral BID  . glyBURIDE  10 mg Oral BID WC  . insulin aspart  0-9 Units Subcutaneous TID WC  . linagliptin  5 mg Oral Daily  . losartan  100 mg Oral Daily  . multivitamins ther. w/minerals  1 tablet Oral Daily  . niacin-lovastatin  1 tablet Oral QHS  . pantoprazole  40 mg Oral BH-[id number]  . piperacillin-tazobactam (ZOSYN)  IV  3.375 g Intravenous Once    . sodium chloride  3 mL Intravenous Q12H  . vancomycin  1,000 mg Intravenous Once  . vancomycin  1,000 mg Intravenous Q8H    Assessment: 71yo male was discharged from hospital two days ago s/p intraparenchymal hemorrhage, now brought in with AMS and febrile, to begin IV ABX for presumed infection of unknown source.  Goal of Therapy:  Vancomycin trough level 15-20 mcg/ml  Plan:  Rec'd vanc 1g and Zosyn 3.375g IV in ED; will continue tx with vancomycin 1g IV Q8H and cefepime 1g IV Q8H and monitor CBC, Cx, levels prn.  Colleen Can PharmD BCPS 07/06/2012,7:48 AM

## 2012-07-07 ENCOUNTER — Ambulatory Visit: Payer: Medicare Other | Admitting: Internal Medicine

## 2012-07-07 DIAGNOSIS — I4891 Unspecified atrial fibrillation: Secondary | ICD-10-CM

## 2012-07-07 DIAGNOSIS — G9340 Encephalopathy, unspecified: Secondary | ICD-10-CM

## 2012-07-07 DIAGNOSIS — R4182 Altered mental status, unspecified: Secondary | ICD-10-CM

## 2012-07-07 LAB — URINE CULTURE
Colony Count: NO GROWTH
Culture: NO GROWTH

## 2012-07-07 LAB — COMPREHENSIVE METABOLIC PANEL
ALT: 18 U/L (ref 0–53)
AST: 18 U/L (ref 0–37)
Albumin: 3.5 g/dL (ref 3.5–5.2)
Alkaline Phosphatase: 66 U/L (ref 39–117)
BUN: 10 mg/dL (ref 6–23)
CO2: 29 mEq/L (ref 19–32)
Calcium: 9.1 mg/dL (ref 8.4–10.5)
Chloride: 97 mEq/L (ref 96–112)
Creatinine, Ser: 0.89 mg/dL (ref 0.50–1.35)
GFR calc Af Amer: >90 mL/min (ref >90)
GFR calc non Af Amer: 84 mL/min — ABNORMAL LOW (ref >90)
Glucose, Bld: 133 mg/dL — ABNORMAL HIGH (ref 70–99)
Potassium: 3.9 mEq/L (ref 3.5–5.1)
Sodium: 136 mEq/L (ref 135–145)
Total Bilirubin: 1.1 mg/dL (ref 0.3–1.2)
Total Protein: 7.1 g/dL (ref 6.0–8.3)

## 2012-07-07 LAB — GLUCOSE, CAPILLARY
Glucose-Capillary: 103 mg/dL — ABNORMAL HIGH (ref 70–99)
Glucose-Capillary: 168 mg/dL — ABNORMAL HIGH (ref 70–99)
Glucose-Capillary: 94 mg/dL (ref 70–99)
Glucose-Capillary: 150 mg/dL — ABNORMAL HIGH (ref 70–99)
Glucose-Capillary: 163 mg/dL — ABNORMAL HIGH (ref 70–99)

## 2012-07-07 LAB — CBC
HCT: 41.6 % (ref 39.0–52.0)
Hemoglobin: 15 g/dL (ref 13.0–17.0)
MCH: 31.4 pg (ref 26.0–34.0)
MCHC: 36.1 g/dL — ABNORMAL HIGH (ref 30.0–36.0)
MCV: 87 fL (ref 78.0–100.0)
Platelets: 182 10*3/uL (ref 150–400)
RBC: 4.78 MIL/uL (ref 4.22–5.81)
RDW: 12.9 % (ref 11.5–15.5)
WBC: 10.2 10*3/uL (ref 4.0–10.5)

## 2012-07-07 LAB — CULTURE, BLOOD (ROUTINE X 2)

## 2012-07-07 LAB — PROCALCITONIN: Procalcitonin: <0.1 ng/mL

## 2012-07-07 MED ORDER — IBUPROFEN 600 MG PO TABS
600.0000 mg | ORAL_TABLET | Freq: Four times a day (QID) | ORAL | Status: DC | PRN
  Administered 2012-07-07 – 2012-07-08 (×2): 600 mg via ORAL
  Filled 2012-07-07 (×3): qty 1

## 2012-07-07 NOTE — Evaluation (Signed)
Physical Therapy Evaluation/Discharge Note Patient Details Name: Bradley [name] MRN: [id number] DOB: 01/07/1941 Today's Date: 07/07/2012 Time: 4098-1191 PT Time Calculation (min): 8 min  PT Assessment / Plan / Recommendation Clinical Impression  71 y.o. male admitted to [facility] after recent discharge for ICH due to AMS.  Dx with encephalopathy with gram (+) blood cultures.  He presents today at his physical baseline, normal strength, normal gait and balance.  He does not need acute or f/u PT.      PT Assessment  Patent does not need any further PT services    Follow Up Recommendations  No PT follow up    Barriers to Discharge  none      Equipment Recommendations  None recommended by PT    Recommendations for Other Services   none     Precautions / Restrictions Restrictions Weight Bearing Restrictions: No   Pertinent Vitals/Pain No reports of pain , VSS      Mobility  Bed Mobility Bed Mobility: Supine to Sit;Sitting - Scoot to Edge of Bed;Sit to Supine Supine to Sit: 7: Independent Sitting - Scoot to Edge of Bed: 7: Independent Sit to Supine: 7: Independent Transfers Sit to Stand: 7: Independent Stand to Sit: 7: Independent Ambulation/Gait Ambulation/Gait Assistance: 7: Independent Ambulation Distance (Feet): 15 Feet Assistive device: None Ambulation/Gait Assistance Details: 15 feet in room with normal gait pattern, no signs of imbalance.               Visit Information  Last PT Received On: 07/07/12 Assistance Needed: +1    Subjective Data  Subjective: Pt able to report history except what day it is "Monday?".  RN reports that he also has no concept of what time it is.   Patient Stated Goal: go home   Prior Functioning  Home Living Lives With: Spouse Available Help at Discharge: Family;Available 24 hours/day Type of Home: House Home Access: Stairs to enter [employer] of Steps: 4 Entrance Stairs-Rails: Right Home Layout: Multi-level Alternate  Level Stairs-Number of Steps: 10+ Alternate Level Stairs-Rails: Right Bathroom Shower/Tub: Health visitor: Standard Home Adaptive Equipment: None Additional Comments: pt completely independent before admission (cooking, walking, etc).  He did have a fall related to his illness, but strength seems to have resolved.   Prior Function Level of Independence: Independent Communication Communication: No difficulties    Cognition  Overall Cognitive Status: Impaired Orientation Level: Disoriented to;Time Cognition - Other Comments: RN reports he is confused about time of day and he was unable to accurately report day of the week to me.      Extremity/Trunk Assessment Right Upper Extremity Assessment RUE ROM/Strength/Tone: Within functional levels Left Upper Extremity Assessment LUE ROM/Strength/Tone: Within functional levels Right Lower Extremity Assessment RLE ROM/Strength/Tone: Within functional levels Left Lower Extremity Assessment LLE ROM/Strength/Tone: Within functional levels   Balance Balance Balance Assessed: Yes Dynamic Standing Balance Dynamic Standing - Balance Support: No upper extremity supported Dynamic Standing - Level of Assistance: 7: Independent Dynamic Standing - Comments: 360 degree turn bil normal, tandem stand normal, no LOB  End of Session PT - End of Session Activity Tolerance: Patient tolerated treatment well Patient left: in bed;with bed alarm set;with call bell/phone within reach Nurse Communication: Mobility status    Lurena Joiner B. Debbrah Sampedro, PT, DPT [phone]   07/07/2012, 11:34 AM

## 2012-07-07 NOTE — Progress Notes (Signed)
Nutrition Brief Note  Patient identified on the Malnutrition Screening Tool (MST) report for weight loss with out trying and poor appetite, generating a score of 2.   Pt reports that he had lost a significant amount of weight in the past, but he has been gaining weight since that time. Appetite and PO intake is good.   Weight hx indicates stable weight x 5 months.   Wt Readings from Last 5 Encounters:  07/06/12 208 lb 8.9 oz (94.6 kg)  07/03/12 206 lb 5.6 oz (93.6 kg)  05/08/12 208 lb (94.348 kg)  01/31/12 207 lb 9.6 oz (94.167 kg)  12/13/11 221 lb (100.245 kg)    Body mass index is 29.09 kg/(m^2). Pt meets criteria for overweight based on current BMI.   Current diet order is Carb Modified Medium, patient is consuming approximately 75-100% of meals at this time. Labs and medications reviewed.   No nutrition interventions warranted at this time. If nutrition issues arise, please consult RD.   Joaquin Courts, RD, LDN, CNSC Pager [phone] After Hours Pager [phone]

## 2012-07-07 NOTE — Progress Notes (Signed)
07/07/2012 3:43 PM Dierdre Searles  [id number]  Report called to Jessie,RN on 5500. VSS. Transferred with belongings to new room.  Celesta Gentile 9/27/20133:43 PM

## 2012-07-07 NOTE — Progress Notes (Signed)
[facility] PROGRESS NOTE  Bradley [name] WGN:[id number] DOB: June 17, 1941 DOA: 07/06/2012 PCP: [name], MD  Assessment/Plan: Principal Problem:  *Altered mental status Suspect secondary to recent intracranial hemorrhage and less likely due to stress at home- apparently he has also been sleeping poorly. Due to his wife's concerns that he keeps his stress "inside" and does not discuss it with her, I have requested a psych eval for depression/ anxiety.   Active Problems: Fever No source of infection found- blood culture - one set is growing anaerobic rods which are often a skin contaminant.  suspect his fever may be central from recent hemorrhage- Have discussed fever and blood culture results w/ ID- Dr Daiva Eves- he advises that we stop the antibiotics for now.     HYPERLIPIDEMIA Cont current medications   HYPERTENSION Reasonable control- cont to follow   Permanent atrial fibrillation Currently rate controlled.    Diabetes mellitus Cont home meds and sliding scale.   nonischemic cardiomyopathy Stable    Intraventricular hemorrhage Repeat CT notes appropriate interval changes without any new bleeding.    Code Status: full code Family Communication: patient's wife Disposition Plan: transfer to med/surg DVT prophylaxis: SCDs   Brief narrative: 71 y/o male brought to the ER by his wife for alerted mental status and incidentally found to be febrile in the ER.  History today is obtained from his wife over the phone. She notes that the very day he went home from the previous hospital stay, she noted he was confused. She feels the confusion has progressed daily and last night she noted that he was trying to get out of bed and fell. She was unable to get him up and was concerned about his confusion and therefore called EMS.  She tells me that their son has been the source of a large amount of stress lately. He has a history of drug abuse and despite being in his 24s is still living  with them (his girlfriend lives with them to). The patients wife is worried that her husband is confused because of these issues.  She is not awary of him having fevers at home- she states when his temperature was taken the "normal" way in the ER, it was fine and then it was taken rectally and noted to be elevated. Pt was asymptomatic in regards to the fever and has not been having chills or sweats at home.   Antibiotics:  Cefepime 9/26  Vancomycin 9/26  HPI/Subjective: Patient is alert and oriented currently. He is aware that he was brought into the hospital for a fall but does not recollect the fall. He is not aware of any fevers, chills. He denies sinus drainage, flu-like symptoms, cough, dysuria and diarrhea. He admits to some tension with his son but feels that his son is getting back on track.   Objective: Filed Vitals:   07/07/12 0400 07/07/12 0746 07/07/12 0850 07/07/12 1230  BP: 148/84 154/79 154/79 133/74  Pulse: 71 71 76   Temp: 98.3 F (36.8 C) 99.2 F (37.3 C)  98.3 F (36.8 C)  TempSrc: Oral Oral  Oral  Resp: 21 25 23    Height:      Weight:      SpO2: 94% 95%      Intake/Output Summary (Last 24 hours) at 07/07/12 1410 Last data filed at 07/07/12 1230  Gross per 24 hour  Intake    780 ml  Output   2575 ml  Net  -1795 ml  Exam:   General:  Alert, oriented to time, place and person- in no acute distress  Cardiovascular: RRR, no murmurs  Respiratory: CTA b/l  Abdomen: soft, NT, ND, BS+  Ext:no c/c/e  Data Reviewed: Basic Metabolic Panel:  Lab 07/07/12 1610 07/06/12 0353 07/01/12 1445  NA 136 134* 136  K 3.9 4.0 4.1  CL 97 95* 99  CO2 29 27 26   GLUCOSE 133* 140* 132*  BUN 10 13 13   CREATININE 0.89 0.80 0.70  CALCIUM 9.1 8.9 9.0  MG -- -- --  PHOS -- -- --   Liver Function Tests:  Lab 07/07/12 0418 07/01/12 1445  AST 18 22  ALT 18 15  ALKPHOS 66 66  BILITOT 1.1 0.8  PROT 7.1 7.6  ALBUMIN 3.5 3.8   No results found for this basename:  LIPASE:5,AMYLASE:5 in the last 168 hours No results found for this basename: AMMONIA:5 in the last 168 hours CBC:  Lab 07/07/12 0418 07/06/12 0353 07/01/12 1445  WBC 10.2 12.2* 8.5  NEUTROABS -- -- 6.2  HGB 15.0 15.9 15.7  HCT 41.6 43.0 43.1  MCV 87.0 85.1 86.4  PLT 182 196 209   Cardiac Enzymes:  Lab 07/06/12 0353 07/01/12 1445  CKTOTAL -- --  CKMB -- --  CKMBINDEX -- --  TROPONINI <0.30 <0.30   BNP (last 3 results)  Basename 07/01/12 1445 09/20/11 1336 09/04/11 1200  PROBNP 1377.0* 3004.0* 1595.0*   CBG:  Lab 07/07/12 0743 07/06/12 2133 07/06/12 1607 07/06/12 1126 07/06/12 0820  GLUCAP 168* 103* 153* 152* 132*    Recent Results (from the past 240 hour(s))  MRSA PCR SCREENING     Status: Normal   Collection Time   07/01/12 11:00 PM      Component Value Range Status Comment   MRSA by PCR NEGATIVE  NEGATIVE Final   URINE CULTURE     Status: Normal   Collection Time   07/06/12  3:42 AM      Component Value Range Status Comment   Specimen Description URINE, CATHETERIZED   Final    Special Requests NONE   Final    Culture  Setup Time 07/06/2012 07:24   Final    Colony Count NO GROWTH   Final    Culture NO GROWTH   Final    Report Status 07/07/2012 FINAL   Final   CULTURE, BLOOD (ROUTINE X 2)     Status: Normal   Collection Time   07/06/12  5:16 AM      Component Value Range Status Comment   Specimen Description BLOOD RIGHT ARM   Final    Special Requests BOTTLES DRAWN AEROBIC AND ANAEROBIC 5CC EACH   Final    Culture  Setup Time 07/06/2012 07:29   Final    Culture     Final    Value: BACILLUS SPECIES     Note: Standardized susceptibility testing for this organism is not available.     Note: Gram Stain Report Called to,Read Back By and Verified With: MATT YORK@902PM  07/06/12 BY THOMI   Report Status 07/07/2012 FINAL   Final   CULTURE, BLOOD (ROUTINE X 2)     Status: Normal (Preliminary result)   Collection Time   07/06/12  5:25 AM      Component Value Range Status  Comment   Specimen Description BLOOD LEFT HAND   Final    Special Requests     Final    Value: BOTTLES DRAWN AEROBIC AND ANAEROBIC AER,10ML ANA  Culture  Setup Time 07/06/2012 07:29   Final    Culture     Final    Value:        BLOOD CULTURE RECEIVED NO GROWTH TO DATE CULTURE WILL BE HELD FOR 5 DAYS BEFORE ISSUING A FINAL NEGATIVE REPORT   Report Status PENDING   Incomplete      Studies: Ct Angio Head W/cm &/or Wo Cm  07/02/2012  *RADIOLOGY REPORT*  Clinical Data:  No intracranial bleed.  CT ANGIOGRAPHY HEAD  Technique:  Multidetector CT imaging of the head was performed using the standard protocol during bolus administration of intravenous contrast.  Multiplanar CT image reconstructions including MIPs were obtained to evaluate the vascular anatomy.  Contrast: 50mL OMNIPAQUE IOHEXOL 350 MG/ML SOLN  Comparison:  07/02/2012 head CT.  Findings:  Hematoma is located within the mid and anterior right temporal horn which is expanded with surrounding vasogenic edema. I suspect that the hemorrhage is related to the choroid.  There is slight prominence of the right anterior choroidal artery compared to the left.  This may reflect the presence of underlying vascular malformation not well demonstrated on the present exam or secondary to blood supply of underlying choroidal mass.  Attention to these possibility on follow-up as hemorrhage clears is recommended.  Small amount of subarachnoid blood is seen in the dependent aspect of the lateral ventricles.  Left internal carotid artery is dilated just proximal to the petrous segment raising possibility fibromuscular dysplasia. Alternatively this may relate to atherosclerotic type changes.  Marked ectasia of the vertebral arteries and basilar artery.  Internal carotid artery cavernous segment is ectatic bilaterally.  No intracranial enhancing mass separate from the above described findings.  Prominent small vessel disease type changes.  Remote infarct anterior limb  left internal capsule suspected.  Fluid level within the left maxillary sinus.   Review of the MIP images confirms the above findings.  IMPRESSION:  Hematoma is located within the mid and anterior right temporal horn which is expanded with surrounding vasogenic edema.  I suspect that the hemorrhage is related to the choroid.  There is slight prominence of the right anterior choroidal artery compared to the left.  This may reflect the presence of underlying vascular malformation not well demonstrated on the present exam or secondary to blood supply of underlying choroidal mass.  Attention to these possibilities on follow-up imaging recommended after hemorrhage has cleared.  Small amount of subarachnoid blood is seen in the dependent aspect of the lateral ventricles.  Left internal carotid artery is dilated just proximal to the petrous segment raising possibility fibromuscular dysplasia. Alternatively this may relate to atherosclerotic type changes.   Original Report Authenticated By: Fuller Canada, M.D.    Dg Chest 2 View  07/01/2012  *RADIOLOGY REPORT*  Clinical Data: Chest pain  CHEST - 2 VIEW  Comparison: 10/28/2011  Findings: Cardiomegaly again noted.  No acute infiltrate or pulmonary edema.  Stable chronic blunting of the costophrenic angles.  Dual lead cardiac pacemaker is unchanged in position.  IMPRESSION: No acute infiltrate or pulmonary edema.  Stable chronic blunting of the costophrenic angles.  Dual lead cardiac pacemaker is unchanged in position.   Original Report Authenticated By: Natasha Mead, M.D.    Dg Abd 1 View  07/01/2012  *RADIOLOGY REPORT*  Clinical Data: Abdominal pain  ABDOMEN - 1 VIEW  Comparison: None.  Findings: There is nonspecific nonobstructive bowel gas pattern. Moderate stool noted in the right colon.  No free abdominal air.  IMPRESSION: Nonspecific  nonobstructive bowel gas pattern.  Moderate stool noted in the right colon.   Original Report Authenticated By: Natasha Mead, M.D.     Ct Head Wo Contrast  07/06/2012  *RADIOLOGY REPORT*  Clinical Data: Generalized weakness.  Headache.  Short of breath. History of hematoma.  CT HEAD WITHOUT CONTRAST  Technique:  Contiguous axial images were obtained from the base of the skull through the vertex without contrast.  Comparison: 07/02/2012.  Findings: Expected evolution of posterior right temporal horn hemorrhage.  Intraventricular blood remains present, layering dependently in the lateral ventricles.  Ventricular size is unchanged compared to prior exam.  Vasogenic edema around the hemorrhage appears similar.  Unchanged left basal ganglia lacunar infarct.  No midline shift.  No new hemorrhages identified.  No evidence of acute infarction.  Small fluid level or mucosal thickening is present in the left maxillary sinus, similar to prior.  Fluid in the right mastoid air cells.  IMPRESSION: Expected evolution of right temporal horn intraventricular hemorrhage with dependently layering intraventricular blood.   Original Report Authenticated By: Andreas Newport, M.D.    Ct Head Wo Contrast  07/02/2012  *RADIOLOGY REPORT*  Clinical Data: Intracranial hemorrhage follow-up.  Mild right-sided weakness began 3 weeks ago.  CT HEAD WITHOUT CONTRAST  Technique:  Contiguous axial images were obtained from the base of the skull through the vertex without contrast.  Comparison: 07/01/2012.  Findings: Blood within expanded right temporal horn unchanged. Mild surrounding vasogenic edema.  Cause indeterminate as previously discussed.  Dependent interventricular blood.  Atrophy and ventricular size similar to the prior exam.  No new intracranial hemorrhage detected.  Prominent small vessel disease type changes.  IMPRESSION:  Blood within expanded right temporal horn unchanged.  Mild surrounding vasogenic edema.  Dependent interventricular blood.  No new intracranial hemorrhage detected.   Original Report Authenticated By: Fuller Canada, M.D.    Ct Head Wo  Contrast  07/01/2012  *RADIOLOGY REPORT*  Clinical Data: Headache for several days.  Diabetic hypertensive.  CT HEAD WITHOUT CONTRAST  Technique:  Contiguous axial images were obtained from the base of the skull through the vertex without contrast.  Comparison: 03/29/2009.  Findings: Right mid to anterior temporal horn hematoma with surrounding vasogenic edema.  Etiology indeterminate.  This may be related to a hypertensive hemorrhage, congophillic/amyloid angiopathy or result of underlying mass/vascular malformation which is not detected on the present examination or the prior unenhanced head CT.  The patient would benefit from follow-up until this clears to evaluate for possible underlying lesion.  Small amount of blood is seen in the dependent aspect of the occipital horns bilaterally.  The ventricular system is slightly more prominent than on prior examination which may be related atrophy rather hydrocephalus and can be accessed on follow-up.  Prominent small vessel disease type changes.  Remote left basal ganglia infarct.  Opacification left maxillary sinus with air fluid level.  IMPRESSION: Right temporal horn hematoma as detailed above with small amount of dependent blood within the occipital horn of the ventricles bilaterally.  Prominent small vessel disease type changes without CT evidence of large acute thrombotic infarct.  Critical Value/emergent results were called by telephone at the time of interpretation on 07/01/2012 and at 4:09 p.m. to Dr. Silverio Lay, who verbally acknowledged these results.   Original Report Authenticated By: Fuller Canada, M.D.    Dg Chest Portable 1 View  07/06/2012  *RADIOLOGY REPORT*  Clinical Data: Weakness.Short of breath.  Hypertension.  PORTABLE CHEST - 1 VIEW  Comparison: 07/01/2012.  Findings: Cardiomegaly appears similar to the prior exam.  Dual lead left subclavian cardiac pacemaker with lead position unchanged.  Pulmonary vascular congestion is present.  Interstitial  pulmonary edema.  Unchanged blunting of the right costophrenic angle and left pleural thickening.  IMPRESSION: Cardiomegaly with pulmonary vascular congestion and interstitial pulmonary edema.  Findings compatible with mild CHF.   Original Report Authenticated By: Andreas Newport, M.D.     Scheduled Meds:   . sodium chloride   Intravenous STAT  . carvedilol  37.5 mg Oral BID WC  . ceFEPime (MAXIPIME) IV  1 g Intravenous Q8H  . fluticasone  1 spray Each Nare Daily  . furosemide  60 mg Oral BID  . glyBURIDE  10 mg Oral BID WC  . insulin aspart  0-9 Units Subcutaneous TID WC  . linagliptin  5 mg Oral Daily  . losartan  100 mg Oral Daily  . multivitamin with minerals  1 tablet Oral Daily  . niacin  1,000 mg Oral QHS  . pantoprazole  40 mg Oral QAC breakfast  . simvastatin  10 mg Oral QHS  . sodium chloride  3 mL Intravenous Q12H  . vancomycin  1,000 mg Intravenous Q8H   Continuous Infusions:   . DISCONTD: sodium chloride 20 mL/hr at 07/06/12 1900    ________________________________________________________________________  Time spent: 30 min    [facility]  [facility] Pager [phone] If 8PM-8AM, please contact night-coverage at [email or web address], password [facility] 07/07/2012, 2:10 PM  LOS: 1 day

## 2012-07-08 DIAGNOSIS — R079 Chest pain, unspecified: Secondary | ICD-10-CM

## 2012-07-08 LAB — GLUCOSE, CAPILLARY
Glucose-Capillary: 131 mg/dL — ABNORMAL HIGH (ref 70–99)
Glucose-Capillary: 193 mg/dL — ABNORMAL HIGH (ref 70–99)
Glucose-Capillary: 96 mg/dL (ref 70–99)
Glucose-Capillary: 110 mg/dL — ABNORMAL HIGH (ref 70–99)

## 2012-07-08 LAB — TROPONIN I
Troponin I: <0.3 ng/mL (ref <0.30)
Troponin I: <0.3 ng/mL (ref <0.30)

## 2012-07-08 LAB — MAGNESIUM: Magnesium: 2.1 mg/dL (ref 1.5–2.5)

## 2012-07-08 MED ORDER — NITROGLYCERIN 0.4 MG SL SUBL: 0.4000 mg | SUBLINGUAL_TABLET | SUBLINGUAL | Status: DC | PRN

## 2012-07-08 MED ORDER — ZOLPIDEM TARTRATE 5 MG PO TABS
5.0000 mg | ORAL_TABLET | Freq: Every evening | ORAL | Status: DC | PRN
  Administered 2012-07-08: 5 mg via ORAL
  Filled 2012-07-08: qty 1

## 2012-07-08 NOTE — Consult Note (Signed)
Reason for Consult: Anxiety Referring Physician: Dr. [name] is an 71 y.o. male.  HPI: Patient was seen and chart reviewed. Patient has been sitting on his bed and keeping his legs out of bed and not in distress. Patient has been stressed about his health problems, his wife health problems and his grown up son addiction to pain medication. Reportedly he is worried about his son and his son worried about his health problems. His son lives with his wife. He has been compliant with his medication from primary care physician and cardiology. Patient endorses some time being confused. He is awake, alert, oriented well and denied symptoms of depression, mania, psychosis. He has denied suicidal or homicidal ideations, intentions or plans.   Past Medical History  Diagnosis Date  . HTN (hypertension)   . HLD (hyperlipidemia)   . DM (diabetes mellitus) 1999    adult onset  . BPH (benign prostatic hyperplasia)     Saw Dr Bradley [name] 2004, normal renal u/s  . Leg ulcer 2011  . CHF (congestive heart failure)     Thought primarily to be non-systolic although EF down (EF 16-10% 12/2010, down to 35-40% 09/05/11), cath 2008 with no CAD, nuclear study 07/2011 showing Small area of reversibility in the distal ant/lat wall the left ventricle suspicious for ischemia/septal wall HK but felt to be low risk  (per D/C Summary 07/2011)  . Pleural effusion 2008    S/p decortication  . Insomnia   . Atrial fibrillation     s/p AV node ablation & BiV PPM implantation 09/08/11 (op dictation pending)  . Sinoatrial node dysfunction   . Headache   . Pulmonary HTN     Past Surgical History  Procedure Date  . Pleural scarification   . Lung decortication   . Colonoscopy 03/10/11    normal  . Pneumothorax with fibrothorax   . Insert / replace / remove pacemaker 09/08/11    pacemaker placement    Family History  Problem Relation Age of Onset  . Diabetes Father   . Breast cancer Maternal Aunt   . Heart  attack Neg Hx   . Prostate cancer Neg Hx   . Colon cancer Neg Hx   . Cancer Neg Hx     colon or prostate  . Coronary artery disease Father   . Polycythemia Mother     Social History:  reports that he quit smoking about 31 years ago. His smoking use included Cigarettes. He has a 62.5 pack-year smoking history. He has never used smokeless tobacco. He reports that he drinks alcohol. He reports that he does not use illicit drugs.  Allergies:  Allergies  Allergen Reactions  . Avelox (Moxifloxacin Hydrochloride) Itching    Headache  . Tadalafil     headache, backache    Medications: I have reviewed the patient's current medications.  Results for orders placed during the hospital encounter of 07/06/12 (from the past 48 hour(s))  GLUCOSE, CAPILLARY     Status: Abnormal   Collection Time   07/06/12  4:07 PM      Component Value Range Comment   Glucose-Capillary 153 (*) 70 - 99 mg/dL    Comment 1 Documented in Chart      Comment 2 Notify RN     GLUCOSE, CAPILLARY     Status: Abnormal   Collection Time   07/06/12  9:33 PM      Component Value Range Comment   Glucose-Capillary 103 (*) 70 - 99  mg/dL    Comment 1 Notify RN      Comment 2 Documented in Chart     CBC     Status: Abnormal   Collection Time   07/07/12  4:18 AM      Component Value Range Comment   WBC 10.2  4.0 - 10.5 K/uL    RBC 4.78  4.22 - 5.81 MIL/uL    Hemoglobin 15.0  13.0 - 17.0 g/dL    HCT 40.9  81.1 - 91.4 %    MCV 87.0  78.0 - 100.0 fL    MCH 31.4  26.0 - 34.0 pg    MCHC 36.1 (*) 30.0 - 36.0 g/dL    RDW 78.2  95.6 - 21.3 %    Platelets 182  150 - 400 K/uL   COMPREHENSIVE METABOLIC PANEL     Status: Abnormal   Collection Time   07/07/12  4:18 AM      Component Value Range Comment   Sodium 136  135 - 145 mEq/L    Potassium 3.9  3.5 - 5.1 mEq/L    Chloride 97  96 - 112 mEq/L    CO2 29  19 - 32 mEq/L    Glucose, Bld 133 (*) 70 - 99 mg/dL    BUN 10  6 - 23 mg/dL    Creatinine, Ser 0.86  0.50 - 1.35 mg/dL     Calcium 9.1  8.4 - 10.5 mg/dL    Total Protein 7.1  6.0 - 8.3 g/dL    Albumin 3.5  3.5 - 5.2 g/dL    AST 18  0 - 37 U/L    ALT 18  0 - 53 U/L    Alkaline Phosphatase 66  39 - 117 U/L    Total Bilirubin 1.1  0.3 - 1.2 mg/dL    GFR calc non Af Amer 84 (*) >90 mL/min    GFR calc Af Amer >90  >90 mL/min   GLUCOSE, CAPILLARY     Status: Abnormal   Collection Time   07/07/12  7:43 AM      Component Value Range Comment   Glucose-Capillary 168 (*) 70 - 99 mg/dL    Comment 1 Documented in Chart      Comment 2 Notify RN     PROCALCITONIN     Status: Normal   Collection Time   07/07/12  8:49 AM      Component Value Range Comment   Procalcitonin <0.10     GLUCOSE, CAPILLARY     Status: Normal   Collection Time   07/07/12 12:28 PM      Component Value Range Comment   Glucose-Capillary 94  70 - 99 mg/dL    Comment 1 Documented in Chart      Comment 2 Notify RN     GLUCOSE, CAPILLARY     Status: Abnormal   Collection Time   07/07/12  4:05 PM      Component Value Range Comment   Glucose-Capillary 150 (*) 70 - 99 mg/dL   GLUCOSE, CAPILLARY     Status: Abnormal   Collection Time   07/07/12  9:49 PM      Component Value Range Comment   Glucose-Capillary 163 (*) 70 - 99 mg/dL    Comment 1 Documented in Chart      Comment 2 Notify RN     GLUCOSE, CAPILLARY     Status: Abnormal   Collection Time   07/08/12  7:19 AM  Component Value Range Comment   Glucose-Capillary 131 (*) 70 - 99 mg/dL   TROPONIN I     Status: Normal   Collection Time   07/08/12 11:20 AM      Component Value Range Comment   Troponin I <0.30  <0.30 ng/mL   GLUCOSE, CAPILLARY     Status: Abnormal   Collection Time   07/08/12 11:54 AM      Component Value Range Comment   Glucose-Capillary 193 (*) 70 - 99 mg/dL     No results found.  No depression, No psychosis and Positive for anxiety, bad mood, depression and sleep disturbance Blood pressure 128/72, pulse 72, temperature 98.9 F (37.2 C), temperature source Oral,  resp. rate 20, height 5\' 11"  (1.803 m), weight 208 lb 8.9 oz (94.6 kg), SpO2 99.00%.   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 He may benefit from SSRI and suggest lexapro 10 mg QD and out patient psychiatric treatment. Please contact social service for specific program he is interested. No further psychiatric needs found. May contact [facility] at [phone] if clinical status changes. 2  Salote Weidmann,JANARDHAHA R. 07/08/2012, 2:44 PM

## 2012-07-08 NOTE — Consult Note (Signed)
NOTE:  Pt 's name was at [facility] properly indicated for [facility] consult.  Information was inadvertently  not timely  transferred to consultant at [facility] until too late; already at [facility]  Will request Sat coverage to see patient. Jadine Brumley J. Ferol Luz, MD Psychiatrist  07/08/2012 12:34 AM

## 2012-07-08 NOTE — Progress Notes (Signed)
[facility] PROGRESS NOTE  Bradley [name] WUJ:[id number] DOB: [birth year] DOA: 07/06/2012 PCP: [name], MD  Assessment/Plan: Principal Problem:  *Altered mental status Active Problems:  HYPERLIPIDEMIA  HYPERTENSION  Permanent atrial fibrillation  Diabetes mellitus  nonischemic cardiomyopathy  Intraventricular hemorrhage    1. Altered mental status:   Patient presented with altered mental status, described as confusion. Head CT scan was devoid of new/acute findings, patient had no focal neurologic findings, and despite a pyrexia documented in ED, septic work up was negative, and procalcitonin was <0.10. Suspect secondary to recent intracranial hemorrhage.  2. Depression/Anxiety:  Per spouse, patient has been sleeping poorly. Apparently, their son has been the source of a large amount of stress lately. He has a history of drug abuse and despite being in his 71s is still living with them (his girlfriend lives with them too).Due to his wife's concerns that he keeps his stress "inside" and does not discuss it with her. A psychiatric eval for depression/ anxiety has been requested.  3. Fever: See above discussion in #1. No source of infection found. Blood culture grew anaerobic rods in 1:2. Fever and blood culture results were discussed with Dr Daiva Eves, ID, and he recommended discontinuation of antibiotics. There has been no relapse of fever. Suspect his fever may be central from recent intracranial hemorrhage.  4. Hyperlipidemia:  Continued on pre-admission lipid-lowering medications.   5. Hypertension:  BP appears reasonably controlled.  6. Chronic atrial fibrillation:  Currently rate controlled. Patient is s/p pacemaker. He is no longer a candidate for anticoagulation, given his bleeding complication.  7. Diabetes mellitus:   Controlled on Diet, SSI and pre-admission diabetic regimen.   8. Nonischemic cardiomyopathy/Chest pain:  Patient is said to have a history of nonischemic  cardiomyopathy, with EF 45%-50% per 2D Echocardiogram of 07/02/12. No evidence of cardiac catheterization in his EMR. Today, he complained of transient, i.e. few seconds of left-sided chest pain without shortness of breath or diaphoresis. Marland Kitchen He has no clinical cCHF decompensation at this time. !2-lead EKG showed paced complexes only. Have placed on telemetric monitorig. Will cycle cardiac enzymes, and utilize SL NTG. If chest pain recurs, may need cardiology consultation. Dr Lewayne Bunting is primary cardiologist.  8. Intraventricular hemorrhage:   Repeat head CT revealed predictable evolutionary changes, without any new bleeding. Patient appears neurologically intact, and otherwise, stable.      Code Status: Full Code.  Family Communication:  Disposition Plan: To be determined.    Brief narrative: 71 y/o male with known history of HTN, dyslipidemia, DM, chronic atrial fibrillation s/p PPM, brought to the ED by his wife for alerted mental status and incidentally found to be febrile in the ED. Patient is s/p hospitalization 07/01/12-07/04/12 for subacute right temporal lobar hematoma. Per history obtained from his wife over the phone, the very day he was discharged, she noted he was confused. She feels the confusion has progressed daily and the previous night she noted that he was trying to get out of bed and fell. She was unable to get him up and was concerned about his confusion and therefore called EMS. He was found to be febrile in the Ed, and was admitted for further evaluation and management.    Consultants:  Discussion with Dr Paulette Blanch Dam, ID.  Psychiatrist.   Procedures:  CXR  Head CT scan.   Antibiotics:  Cefepime 07/06/12-07/07/12.  Vancomycin 07/05/12-27/13.   Zosyn 07/06/12 only.   HPI/Subjective: transient chest pain this morning, otherwise, no new issues.  Objective: Vital signs in last 24 hours: Temp:  [98.3 F (36.8 C)-99.7 F (37.6 C)] 99 F (37.2 C) (09/28  0800) Pulse Rate:  [65-74] 65  (09/28 0800) Resp:  [20-24] 20  (09/28 0446) BP: (120-158)/(67-95) 148/85 mmHg (09/28 0800) SpO2:  [93 %-100 %] 97 % (09/28 0446) Weight change:  Last BM Date: 07/07/12  Intake/Output from previous day: 09/27 0701 - 09/28 0700 In: 710 [P.O.:240; I.V.:20; IV Piggyback:450] Out: 1450 [Urine:1450] Total I/O In: 360 [P.O.:360] Out: -    Physical Exam: General: Comfortable, alert, communicative, fully oriented, not short of breath at rest.  HEENT:  No clinical pallor, no jaundice, no conjunctival injection or discharge. Hydration status is fair.  NECK:  Supple, JVP not seen, no carotid bruits, no palpable lymphadenopathy, no palpable goiter. CHEST:  Clinically clear to auscultation, no wheezes, no crackles. HEART:  Sounds 1 and 2 heard, normal, regular, no murmurs. ABDOMEN:  Full, soft, non-tender, no palpable organomegaly, no palpable masses, normal bowel sounds. GENITALIA:  Not examined. LOWER EXTREMITIES:  No pitting edema, palpable peripheral pulses. MUSCULOSKELETAL SYSTEM:  Generalized osteoarthritic changes, otherwise, normal. CENTRAL NERVOUS SYSTEM:  No focal neurologic deficit on gross examination.    Lab Results:  Basename 07/07/12 0418 07/06/12 0353  WBC 10.2 12.2*  HGB 15.0 15.9  HCT 41.6 43.0  PLT 182 196    Basename 07/07/12 0418 07/06/12 0353  NA 136 134*  K 3.9 4.0  CL 97 95*  CO2 29 27  GLUCOSE 133* 140*  BUN 10 13  CREATININE 0.89 0.80  CALCIUM 9.1 8.9   Recent Results (from the past 240 hour(s))  MRSA PCR SCREENING     Status: Normal   Collection Time   07/01/12 11:00 PM      Component Value Range Status Comment   MRSA by PCR NEGATIVE  NEGATIVE Final   URINE CULTURE     Status: Normal   Collection Time   07/06/12  3:42 AM      Component Value Range Status Comment   Specimen Description URINE, CATHETERIZED   Final    Special Requests NONE   Final    Culture  Setup Time 07/06/2012 07:24   Final    Colony Count NO  GROWTH   Final    Culture NO GROWTH   Final    Report Status 07/07/2012 FINAL   Final   CULTURE, BLOOD (ROUTINE X 2)     Status: Normal   Collection Time   07/06/12  5:16 AM      Component Value Range Status Comment   Specimen Description BLOOD RIGHT ARM   Final    Special Requests BOTTLES DRAWN AEROBIC AND ANAEROBIC 5CC EACH   Final    Culture  Setup Time 07/06/2012 07:29   Final    Culture     Final    Value: BACILLUS SPECIES     Note: Standardized susceptibility testing for this organism is not available.     Note: Gram Stain Report Called to,Read Back By and Verified With: MATT YORK@902PM  07/06/12 BY THOMI   Report Status 07/07/2012 FINAL   Final   CULTURE, BLOOD (ROUTINE X 2)     Status: Normal (Preliminary result)   Collection Time   07/06/12  5:25 AM      Component Value Range Status Comment   Specimen Description BLOOD LEFT HAND   Final    Special Requests     Final    Value: BOTTLES DRAWN AEROBIC AND ANAEROBIC  AER,10ML ANA   Culture  Setup Time 07/06/2012 07:29   Final    Culture     Final    Value:        BLOOD CULTURE RECEIVED NO GROWTH TO DATE CULTURE WILL BE HELD FOR 5 DAYS BEFORE ISSUING A FINAL NEGATIVE REPORT   Report Status PENDING   Incomplete      Studies/Results: No results found.  Medications: Scheduled Meds:   . carvedilol  37.5 mg Oral BID WC  . fluticasone  1 spray Each Nare Daily  . furosemide  60 mg Oral BID  . glyBURIDE  10 mg Oral BID WC  . insulin aspart  0-9 Units Subcutaneous TID WC  . linagliptin  5 mg Oral Daily  . losartan  100 mg Oral Daily  . multivitamin with minerals  1 tablet Oral Daily  . niacin  1,000 mg Oral QHS  . pantoprazole  40 mg Oral QAC breakfast  . simvastatin  10 mg Oral QHS  . sodium chloride  3 mL Intravenous Q12H  . DISCONTD: ceFEPime (MAXIPIME) IV  1 g Intravenous Q8H  . DISCONTD: vancomycin  1,000 mg Intravenous Q8H   Continuous Infusions:   . DISCONTD: sodium chloride 20 mL/hr at 07/06/12 1900   PRN  Meds:.acetaminophen, acetaminophen, albuterol, alum & mag hydroxide-simeth, dextromethorphan-guaiFENesin, ibuprofen, ondansetron (ZOFRAN) IV, ondansetron    LOS: 2 days   Bradley [name],Bradley [name]  [facility] Pager [phone]. If 8PM-8AM, please contact night-coverage at [email or web address], password [email or web address] 07/08/2012, 9:44 AM  LOS: 2 days

## 2012-07-08 NOTE — Progress Notes (Signed)
1024 - pt c/o left sided chest pain of 3-4 sharp. Placed pt on 4L 02 [place] vital signs 111/68 - 71 - 18 - 99% 4L. MD paged . 12 lead obtained. New orders from MD will be acknowledged. Will continue to monitor pt.

## 2012-07-09 DIAGNOSIS — R509 Fever, unspecified: Secondary | ICD-10-CM

## 2012-07-09 DIAGNOSIS — F329 Major depressive disorder, single episode, unspecified: Secondary | ICD-10-CM

## 2012-07-09 DIAGNOSIS — F4323 Adjustment disorder with mixed anxiety and depressed mood: Secondary | ICD-10-CM

## 2012-07-09 LAB — BASIC METABOLIC PANEL
BUN: 15 mg/dL (ref 6–23)
CO2: 25 mEq/L (ref 19–32)
Calcium: 8.9 mg/dL (ref 8.4–10.5)
Chloride: 97 mEq/L (ref 96–112)
Creatinine, Ser: 0.71 mg/dL (ref 0.50–1.35)
GFR calc Af Amer: >90 mL/min (ref >90)
GFR calc non Af Amer: >90 mL/min (ref >90)
Glucose, Bld: 120 mg/dL — ABNORMAL HIGH (ref 70–99)
Potassium: 3.7 mEq/L (ref 3.5–5.1)
Sodium: 135 mEq/L (ref 135–145)

## 2012-07-09 LAB — GLUCOSE, CAPILLARY
Glucose-Capillary: 149 mg/dL — ABNORMAL HIGH (ref 70–99)
Glucose-Capillary: 157 mg/dL — ABNORMAL HIGH (ref 70–99)
Glucose-Capillary: 165 mg/dL — ABNORMAL HIGH (ref 70–99)
Glucose-Capillary: 89 mg/dL (ref 70–99)

## 2012-07-09 LAB — CBC
HCT: 41.8 % (ref 39.0–52.0)
Hemoglobin: 15.1 g/dL (ref 13.0–17.0)
MCH: 31.5 pg (ref 26.0–34.0)
MCHC: 36.1 g/dL — ABNORMAL HIGH (ref 30.0–36.0)
MCV: 87.1 fL (ref 78.0–100.0)
Platelets: 192 10*3/uL (ref 150–400)
RBC: 4.8 MIL/uL (ref 4.22–5.81)
RDW: 12.8 % (ref 11.5–15.5)
WBC: 9 10*3/uL (ref 4.0–10.5)

## 2012-07-09 LAB — TROPONIN I: Troponin I: <0.3 ng/mL (ref <0.30)

## 2012-07-09 MED ORDER — AMLODIPINE BESYLATE 5 MG PO TABS
5.0000 mg | ORAL_TABLET | Freq: Every day | ORAL | Status: DC
  Administered 2012-07-09 – 2012-07-10 (×2): 5 mg via ORAL
  Filled 2012-07-09 (×3): qty 1

## 2012-07-09 MED ORDER — ZOLPIDEM TARTRATE 5 MG PO TABS
10.0000 mg | ORAL_TABLET | Freq: Every evening | ORAL | Status: DC | PRN
  Administered 2012-07-09: 10 mg via ORAL
  Filled 2012-07-09: qty 2

## 2012-07-09 MED ORDER — ESCITALOPRAM OXALATE 10 MG PO TABS
10.0000 mg | ORAL_TABLET | Freq: Every day | ORAL | Status: DC
  Administered 2012-07-09: 10 mg via ORAL
  Filled 2012-07-09 (×3): qty 1

## 2012-07-09 MED ORDER — TRAMADOL HCL 50 MG PO TABS
50.0000 mg | ORAL_TABLET | Freq: Four times a day (QID) | ORAL | Status: DC | PRN
  Filled 2012-07-09: qty 1

## 2012-07-09 NOTE — Discharge Summary (Addendum)
Physician Discharge Summary  Bradley [name] ZOX:[id number] DOB: [birth year] DOA: 07/06/2012  PCP: [name], MD  Admit date: 07/06/2012 Discharge date: 07/10/2012  Recommendations for Outpatient Follow-up:  1. Follow up with primary MD.  2. Keep prior appointment with Dr Pearlean Brownie, neurologist. 3. Psychiatry outpatient follow up.   Discharge Diagnoses:  Principal Problem:  *Altered mental status Active Problems:  HYPERLIPIDEMIA  HYPERTENSION  Permanent atrial fibrillation  Diabetes mellitus  nonischemic cardiomyopathy  Intraventricular hemorrhage   Discharge Condition: Satisfactory.  Diet recommendation: Heart-Healthy/Carbohydrate-Modified.  Filed Weights   07/06/12 0655  Weight: 94.6 kg (208 lb 8.9 oz)    History of present illness:  71 y/o male with known history of HTN, dyslipidemia, DM, chronic atrial fibrillation s/p PPM, brought to the ED by his wife for alerted mental status and incidentally found to be febrile in the ED. Patient is s/p hospitalization 07/01/12-07/04/12 for subacute right temporal lobar hematoma. Per history obtained from his wife over the phone, the very day he was discharged, she noted he was confused. She feels the confusion has progressed daily and the previous night she noted that he was trying to get out of bed and fell. She was unable to get him up and was concerned about his confusion and therefore called EMS. He was found to be febrile in the Ed, and was admitted for further evaluation and management.    Hospital Course:  1. Altered mental status:  Patient presented with altered mental status, described as confusion. Head CT scan was devoid of new/acute findings, and he had no focal neurologic deficit. Despite a pyrexia documented in ED, septic work up was negative, and procalcitonin was <0.10. Suspect etiology is recent intracranial hemorrhage. Mental status is now at baseline.  2. Depression/Anxiety: Per spouse, patient has been sleeping poorly.  Apparently, their son has been the source of a large amount of stress lately. He has a history of drug abuse and despite being in his 44s, is still living with them (his girlfriend lives with them too). Due to his wife's concerns that he keeps his stress "inside" and does not discuss it with her. A psychiatric eval for depression/anxiety was requested, and was provided on 07/09/12 by Dr Darrol Jump, who feels he may benefit from SSRI and suggested Lexapro 10 mg daily and outpatient psychiatric treatment. This will be arranged by social services.  3. Fever:  See above discussion in #1. No source of infection found. Blood culture grew anaerobic rods in 1:2. Fever and blood culture results were discussed with Dr Daiva Eves, ID, and he recommended discontinuation of antibiotics. There has been no recurrence, during this hospitalization. Suspect his fever may be central from recent intracranial hemorrhage.  4. Hyperlipidemia:  Continued on pre-admission lipid-lowering medications.  5. Hypertension:  BP was sub-optimally controlled in the first few days of hospitalization. As patient was already on Losartan, Coreg and Lasix, have added Norvasc 10 mg daily.  6. Chronic atrial fibrillation:  Currently rate controlled. Patient is s/p pacemaker. He is no longer a candidate for anticoagulation, given his bleeding complication.  7. Diabetes mellitus:  Controlled on Diet, SSI and pre-admission diabetic regimen.  8. Nonischemic cardiomyopathy/Chest pain:  Patient is said to have a history of nonischemic cardiomyopathy, with EF 45%-50% per 2D Echocardiogram of 07/02/12. No evidence of cardiac catheterization in his EMR. On 07/08/12, he complained of transient, i.e. few seconds eft-sided chest pain without shortness of breath or diaphoresis. He had no clinical CHF decompensation. 12-lead EKG showed paced complexes  only. He was monitored telemetrically, cardiac enzymes remained unelevated, and he had no recurrence  of chest pain. He will follow up with Dr Lewayne Bunting, his primary cardiologist, on discharge.  8. Intraventricular hemorrhage:  Repeat head CT revealed predictable evolutionary changes, without any new bleeding. Patient remained neurologically intact, and otherwise, stable.       Procedures:  See below.   Consultations: 1. Discussed with Dr Paulette Blanch Dam, infectious diseases specialist. 2. Dr Darrol Jump, Psychiatrist.    Discharge Exam: Filed Vitals:   07/09/12 1224 07/09/12 1417 07/09/12 2035 07/10/12 0536  BP: 131/82 144/95 155/85 155/93  Pulse:  70 72 68  Temp:  98.6 F (37 C) 98.5 F (36.9 C) 98.3 F (36.8 C)  TempSrc:  Oral Oral Axillary  Resp:  18 18 18   Height:      Weight:      SpO2:  98% 92% 95%    General: Comfortable, alert, communicative, fully oriented, not short of breath at rest.  HEENT: No clinical pallor, no jaundice, no conjunctival injection or discharge. Hydration status is fair.  NECK: Supple, JVP not seen, no carotid bruits, no palpable lymphadenopathy, no palpable goiter.  CHEST: Clinically clear to auscultation, no wheezes, no crackles.  HEART: Sounds 1 and 2 heard, normal, regular, no murmurs.  ABDOMEN: Full, soft, non-tender, no palpable organomegaly, no palpable masses, normal bowel sounds.  GENITALIA: Not examined.  LOWER EXTREMITIES: No pitting edema, palpable peripheral pulses.  MUSCULOSKELETAL SYSTEM: Generalized osteoarthritic changes, otherwise, normal.  CENTRAL NERVOUS SYSTEM: No focal neurologic deficit on gross examination.  Discharge Instructions      Discharge Orders    Future Appointments: Provider: Department: Dept Phone: Center:   07/11/2012 12:15 PM [facility] [phone] None   09/06/2012 11:00 AM [name], MD [facility] [phone] [facility]   09/12/2012 9:15 AM Marinus Maw, MD [facility] [phone] [facility]     Future Orders Please  Complete By Expires   Diet - low sodium heart healthy      Diet Carb Modified      Increase activity slowly          Medication List     As of 07/10/2012 11:53 AM    TAKE these medications         amLODipine 10 MG tablet   Commonly known as: NORVASC   Take 1 tablet (10 mg total) by mouth daily.      carvedilol 25 MG tablet   Commonly known as: COREG   Take 1.5 tablets (37.5 mg total) by mouth 2 (two) times daily with a meal.      escitalopram 10 MG tablet   Commonly known as: LEXAPRO   Take 1 tablet (10 mg total) by mouth at bedtime.      furosemide 40 MG tablet   Commonly known as: LASIX   Take 1.5 tablets (60 mg total) by mouth 2 (two) times daily.      glyBURIDE 5 MG tablet   Commonly known as: DIABETA   Take 2 tablets (10 mg total) by mouth 2 (two) times daily with a meal.      losartan 100 MG tablet   Commonly known as: COZAAR   Take 1 tablet (100 mg total) by mouth daily.      multivitamins ther. w/minerals Tabs   Take 1 tablet by mouth daily.      niacin-lovastatin 1000-20 MG 24 hr tablet   Commonly known as: ADVICOR   Take  1 tablet by mouth at bedtime.      nitroGLYCERIN 0.4 MG SL tablet   Commonly known as: NITROSTAT   Place 1 tablet (0.4 mg total) under the tongue every 5 (five) minutes as needed for chest pain.      pantoprazole 40 MG tablet   Commonly known as: PROTONIX   Take 40 mg by mouth every morning.      sitaGLIPtin 100 MG tablet   Commonly known as: JANUVIA   Take 1 tablet (100 mg total) by mouth daily.      traMADol 50 MG tablet   Commonly known as: ULTRAM   Take 1 tablet (50 mg total) by mouth every 6 (six) hours as needed.      zolpidem 10 MG tablet   Commonly known as: AMBIEN   Take 1 tablet (10 mg total) by mouth at bedtime as needed. For sleep         Follow-up Information    Follow up with [name], MD.   Contact information:   [phone] [address] [employer] [address] [place] [address] [phone]       Follow  up with Gates Rigg, MD. (Keep prior appointment. )    Contact information:   [address] [facility] [place] [address] [phone]           The results of significant diagnostics from this hospitalization (including imaging, microbiology, ancillary and laboratory) are listed below for reference.    Significant Diagnostic Studies: Ct Angio Head W/cm &/or Wo Cm  07/02/2012  *RADIOLOGY REPORT*  Clinical Data:  No intracranial bleed.  CT ANGIOGRAPHY HEAD  Technique:  Multidetector CT imaging of the head was performed using the standard protocol during bolus administration of intravenous contrast.  Multiplanar CT image reconstructions including MIPs were obtained to evaluate the vascular anatomy.  Contrast: 50mL OMNIPAQUE IOHEXOL 350 MG/ML SOLN  Comparison:  07/02/2012 head CT.  Findings:  Hematoma is located within the mid and anterior right temporal horn which is expanded with surrounding vasogenic edema. I suspect that the hemorrhage is related to the choroid.  There is slight prominence of the right anterior choroidal artery compared to the left.  This may reflect the presence of underlying vascular malformation not well demonstrated on the present exam or secondary to blood supply of underlying choroidal mass.  Attention to these possibility on follow-up as hemorrhage clears is recommended.  Small amount of subarachnoid blood is seen in the dependent aspect of the lateral ventricles.  Left internal carotid artery is dilated just proximal to the petrous segment raising possibility fibromuscular dysplasia. Alternatively this may relate to atherosclerotic type changes.  Marked ectasia of the vertebral arteries and basilar artery.  Internal carotid artery cavernous segment is ectatic bilaterally.  No intracranial enhancing mass separate from the above described findings.  Prominent small vessel disease type changes.  Remote infarct anterior limb left internal  capsule suspected.  Fluid level within the left maxillary sinus.   Review of the MIP images confirms the above findings.  IMPRESSION:  Hematoma is located within the mid and anterior right temporal horn which is expanded with surrounding vasogenic edema.  I suspect that the hemorrhage is related to the choroid.  There is slight prominence of the right anterior choroidal artery compared to the left.  This may reflect the presence of underlying vascular malformation not well demonstrated on the present exam or secondary to blood supply of underlying choroidal mass.  Attention  to these possibilities on follow-up imaging recommended after hemorrhage has cleared.  Small amount of subarachnoid blood is seen in the dependent aspect of the lateral ventricles.  Left internal carotid artery is dilated just proximal to the petrous segment raising possibility fibromuscular dysplasia. Alternatively this may relate to atherosclerotic type changes.   Original Report Authenticated By: Fuller Canada, M.D.    Dg Chest 2 View  07/01/2012  *RADIOLOGY REPORT*  Clinical Data: Chest pain  CHEST - 2 VIEW  Comparison: 10/28/2011  Findings: Cardiomegaly again noted.  No acute infiltrate or pulmonary edema.  Stable chronic blunting of the costophrenic angles.  Dual lead cardiac pacemaker is unchanged in position.  IMPRESSION: No acute infiltrate or pulmonary edema.  Stable chronic blunting of the costophrenic angles.  Dual lead cardiac pacemaker is unchanged in position.   Original Report Authenticated By: Natasha Mead, M.D.    Dg Abd 1 View  07/01/2012  *RADIOLOGY REPORT*  Clinical Data: Abdominal pain  ABDOMEN - 1 VIEW  Comparison: None.  Findings: There is nonspecific nonobstructive bowel gas pattern. Moderate stool noted in the right colon.  No free abdominal air.  IMPRESSION: Nonspecific nonobstructive bowel gas pattern.  Moderate stool noted in the right colon.   Original Report Authenticated By: Natasha Mead, M.D.    Ct Head Wo  Contrast  07/06/2012  *RADIOLOGY REPORT*  Clinical Data: Generalized weakness.  Headache.  Short of breath. History of hematoma.  CT HEAD WITHOUT CONTRAST  Technique:  Contiguous axial images were obtained from the base of the skull through the vertex without contrast.  Comparison: 07/02/2012.  Findings: Expected evolution of posterior right temporal horn hemorrhage.  Intraventricular blood remains present, layering dependently in the lateral ventricles.  Ventricular size is unchanged compared to prior exam.  Vasogenic edema around the hemorrhage appears similar.  Unchanged left basal ganglia lacunar infarct.  No midline shift.  No new hemorrhages identified.  No evidence of acute infarction.  Small fluid level or mucosal thickening is present in the left maxillary sinus, similar to prior.  Fluid in the right mastoid air cells.  IMPRESSION: Expected evolution of right temporal horn intraventricular hemorrhage with dependently layering intraventricular blood.   Original Report Authenticated By: Andreas Newport, M.D.    Ct Head Wo Contrast  07/02/2012  *RADIOLOGY REPORT*  Clinical Data: Intracranial hemorrhage follow-up.  Mild right-sided weakness began 3 weeks ago.  CT HEAD WITHOUT CONTRAST  Technique:  Contiguous axial images were obtained from the base of the skull through the vertex without contrast.  Comparison: 07/01/2012.  Findings: Blood within expanded right temporal horn unchanged. Mild surrounding vasogenic edema.  Cause indeterminate as previously discussed.  Dependent interventricular blood.  Atrophy and ventricular size similar to the prior exam.  No new intracranial hemorrhage detected.  Prominent small vessel disease type changes.  IMPRESSION:  Blood within expanded right temporal horn unchanged.  Mild surrounding vasogenic edema.  Dependent interventricular blood.  No new intracranial hemorrhage detected.   Original Report Authenticated By: Fuller Canada, M.D.    Ct Head Wo  Contrast  07/01/2012  *RADIOLOGY REPORT*  Clinical Data: Headache for several days.  Diabetic hypertensive.  CT HEAD WITHOUT CONTRAST  Technique:  Contiguous axial images were obtained from the base of the skull through the vertex without contrast.  Comparison: 03/29/2009.  Findings: Right mid to anterior temporal horn hematoma with surrounding vasogenic edema.  Etiology indeterminate.  This may be related to a hypertensive hemorrhage, congophillic/amyloid angiopathy or result of underlying mass/vascular malformation  which is not detected on the present examination or the prior unenhanced head CT.  The patient would benefit from follow-up until this clears to evaluate for possible underlying lesion.  Small amount of blood is seen in the dependent aspect of the occipital horns bilaterally.  The ventricular system is slightly more prominent than on prior examination which may be related atrophy rather hydrocephalus and can be accessed on follow-up.  Prominent small vessel disease type changes.  Remote left basal ganglia infarct.  Opacification left maxillary sinus with air fluid level.  IMPRESSION: Right temporal horn hematoma as detailed above with small amount of dependent blood within the occipital horn of the ventricles bilaterally.  Prominent small vessel disease type changes without CT evidence of large acute thrombotic infarct.  Critical Value/emergent results were called by telephone at the time of interpretation on 07/01/2012 and at 4:09 p.m. to Dr. Silverio Lay, who verbally acknowledged these results.   Original Report Authenticated By: Fuller Canada, M.D.    Dg Chest Portable 1 View  07/06/2012  *RADIOLOGY REPORT*  Clinical Data: Weakness.Short of breath.  Hypertension.  PORTABLE CHEST - 1 VIEW  Comparison: 07/01/2012.  Findings: Cardiomegaly appears similar to the prior exam.  Dual lead left subclavian cardiac pacemaker with lead position unchanged.  Pulmonary vascular congestion is present.  Interstitial  pulmonary edema.  Unchanged blunting of the right costophrenic angle and left pleural thickening.  IMPRESSION: Cardiomegaly with pulmonary vascular congestion and interstitial pulmonary edema.  Findings compatible with mild CHF.   Original Report Authenticated By: Andreas Newport, M.D.     Microbiology: Recent Results (from the past 240 hour(s))  MRSA PCR SCREENING     Status: Normal   Collection Time   07/01/12 11:00 PM      Component Value Range Status Comment   MRSA by PCR NEGATIVE  NEGATIVE Final   URINE CULTURE     Status: Normal   Collection Time   07/06/12  3:42 AM      Component Value Range Status Comment   Specimen Description URINE, CATHETERIZED   Final    Special Requests NONE   Final    Culture  Setup Time 07/06/2012 07:24   Final    Colony Count NO GROWTH   Final    Culture NO GROWTH   Final    Report Status 07/07/2012 FINAL   Final   CULTURE, BLOOD (ROUTINE X 2)     Status: Normal   Collection Time   07/06/12  5:16 AM      Component Value Range Status Comment   Specimen Description BLOOD RIGHT ARM   Final    Special Requests BOTTLES DRAWN AEROBIC AND ANAEROBIC 5CC EACH   Final    Culture  Setup Time 07/06/2012 07:29   Final    Culture     Final    Value: BACILLUS SPECIES     Note: Standardized susceptibility testing for this organism is not available.     Note: Gram Stain Report Called to,Read Back By and Verified With: MATT YORK@902PM  07/06/12 BY THOMI   Report Status 07/07/2012 FINAL   Final   CULTURE, BLOOD (ROUTINE X 2)     Status: Normal (Preliminary result)   Collection Time   07/06/12  5:25 AM      Component Value Range Status Comment   Specimen Description BLOOD LEFT HAND   Final    Special Requests     Final    Value: BOTTLES DRAWN AEROBIC AND ANAEROBIC AER,10ML  ANA   Culture  Setup Time 07/06/2012 07:29   Final    Culture     Final    Value:        BLOOD CULTURE RECEIVED NO GROWTH TO DATE CULTURE WILL BE HELD FOR 5 DAYS BEFORE ISSUING A FINAL NEGATIVE  REPORT   Report Status PENDING   Incomplete      Labs: Basic Metabolic Panel:  Lab 07/10/12 1610 07/09/12 0621 07/08/12 1635 07/07/12 0418 07/06/12 0353  NA 134* 135 -- 136 134*  K 3.4* 3.7 -- 3.9 4.0  CL 97 97 -- 97 95*  CO2 27 25 -- 29 27  GLUCOSE 97 120* -- 133* 140*  BUN 14 15 -- 10 13  CREATININE 0.74 0.71 -- 0.89 0.80  CALCIUM 9.0 8.9 -- 9.1 8.9  MG -- -- 2.1 -- --  PHOS -- -- -- -- --   Liver Function Tests:  Lab 07/07/12 0418  AST 18  ALT 18  ALKPHOS 66  BILITOT 1.1  PROT 7.1  ALBUMIN 3.5   No results found for this basename: LIPASE:5,AMYLASE:5 in the last 168 hours No results found for this basename: AMMONIA:5 in the last 168 hours CBC:  Lab 07/10/12 0528 07/09/12 0621 07/07/12 0418 07/06/12 0353  WBC 8.9 9.0 10.2 12.2*  NEUTROABS -- -- -- --  HGB 15.0 15.1 15.0 15.9  HCT 41.1 41.8 41.6 43.0  MCV 86.7 87.1 87.0 85.1  PLT 205 192 182 196   Cardiac Enzymes:  Lab 07/09/12 0015 07/08/12 1635 07/08/12 1120 07/06/12 0353  CKTOTAL -- -- -- --  CKMB -- -- -- --  CKMBINDEX -- -- -- --  TROPONINI <0.30 <0.30 <0.30 <0.30   BNP: BNP (last 3 results)  Basename 07/01/12 1445 09/20/11 1336 09/04/11 1200  PROBNP 1377.0* 3004.0* 1595.0*   CBG:  Lab 07/10/12 0744 07/09/12 2025 07/09/12 1638 07/09/12 1200 07/09/12 0751  GLUCAP 97 89 165* 157* 149*    Time coordinating discharge: 40 minutes  Signed:  Sayre Mazor,CHRISTOPHER  [facility] 07/10/2012, 11:53 AM

## 2012-07-09 NOTE — Progress Notes (Signed)
Patient repeatedly gets out of bed without using call bell for assistance.  Patient bed alarm went off, went in to assist patient to bathroom and patient stated he was getting up to get something to eat.  Patient stated he thought he was at "[place]".  Reoriented to place and patient verified where he was and what day.  Will continue to monitor.  Macarthur Critchley, RN

## 2012-07-09 NOTE — Progress Notes (Signed)
[facility] PROGRESS NOTE  CREE KUNERT ZOX:[id number] DOB: 1940/11/23 DOA: 07/06/2012 PCP: [name], MD  Assessment/Plan: Principal Problem:  *Altered mental status Active Problems:  HYPERLIPIDEMIA  HYPERTENSION  Permanent atrial fibrillation  Diabetes mellitus  nonischemic cardiomyopathy  Intraventricular hemorrhage    1. Altered mental status:   Patient presented with altered mental status, described as confusion. Head CT scan was devoid of new/acute findings, patient had no focal neurologic findings, and despite a pyrexia documented in ED, septic work up was negative, and procalcitonin was <0.10. Suspect secondary to recent intracranial hemorrhage. Mental status is now at baseline.  2. Depression/Anxiety:  Per spouse, patient has been sleeping poorly. Apparently, their son has been the source of a large amount of stress lately. He has a history of drug abuse and despite being in his 58s is still living with them (his girlfriend lives with them too).Due to his wife's concerns that he keeps his stress "inside" and does not discuss it with her. A psychiatric eval for depression/ anxiety has been requested.  3. Fever: See above discussion in #1. No source of infection found. Blood culture grew anaerobic rods in 1:2. Fever and blood culture results were discussed with Dr Daiva Eves, ID, and he recommended discontinuation of antibiotics. There has been no relapse of fever. Suspect his fever may be central from recent intracranial hemorrhage. There has been no recurrence, during this hospitalization. 4. Hyperlipidemia:  Continued on pre-admission lipid-lowering medications.   5. Hypertension:  BP appears sub-optimally controlled. Patient is already on Losartan, Coreg and Lasix. Have added Norvasc 5 mg today. .  6. Chronic atrial fibrillation:  Currently rate controlled. Patient is s/p pacemaker. He is no longer a candidate for anticoagulation, given his bleeding complication.  7. Diabetes  mellitus:   Controlled on Diet, SSI and pre-admission diabetic regimen.   8. Nonischemic cardiomyopathy/Chest pain:  Patient is said to have a history of nonischemic cardiomyopathy, with EF 45%-50% per 2D Echocardiogram of 07/02/12. No evidence of cardiac catheterization in his EMR. Today, he complained of transient, i.e. few seconds of left-sided chest pain without shortness of breath or diaphoresis. Marland Kitchen He has no clinical cCHF decompensation at this time. !2-lead EKG showed paced complexes only. Have placed on telemetric monitorig. Cardiac enzymes remained unelevated, and he has had no recurrence of chest pain. He will follow up with Dr Lewayne Bunting, his primary cardiologist, on discharge.  8. Intraventricular hemorrhage:   Repeat head CT revealed predictable evolutionary changes, without any new bleeding. Patient appears neurologically intact, and otherwise, stable.      Code Status: Full Code.  Family Communication:  Disposition Plan: Aiming discharge on 07/10/12.    Brief narrative: 71 y/o male with known history of HTN, dyslipidemia, DM, chronic atrial fibrillation s/p PPM, brought to the ED by his wife for alerted mental status and incidentally found to be febrile in the ED. Patient is s/p hospitalization 07/01/12-07/04/12 for subacute right temporal lobar hematoma. Per history obtained from his wife over the phone, the very day he was discharged, she noted he was confused. She feels the confusion has progressed daily and the previous night she noted that he was trying to get out of bed and fell. She was unable to get him up and was concerned about his confusion and therefore called EMS. He was found to be febrile in the Ed, and was admitted for further evaluation and management.    Consultants:  Discussion with Dr Paulette Blanch Dam, ID.  Psychiatrist.  Procedures:  CXR  Head CT scan.   Antibiotics:  Cefepime 07/06/12-07/07/12.  Vancomycin 07/05/12-27/13.   Zosyn 07/06/12 only.    HPI/Subjective: No new issues.   Objective: Vital signs in last 24 hours: Temp:  [98.5 F (36.9 C)-98.9 F (37.2 C)] 98.6 F (37 C) (09/29 0454) Pulse Rate:  [70-74] 70  (09/29 0454) Resp:  [18-20] 18  (09/29 0454) BP: (128-153)/(72-87) 149/83 mmHg (09/29 0454) SpO2:  [98 %-99 %] 98 % (09/29 0454) Weight change:  Last BM Date: 07/07/12  Intake/Output from previous day: 09/28 0701 - 09/29 0700 In: 960 [P.O.:960] Out: 1475 [Urine:1475] Total I/O In: 180 [P.O.:180] Out: -    Physical Exam: General: Comfortable, alert, communicative, fully oriented, not short of breath at rest.  HEENT:  No clinical pallor, no jaundice, no conjunctival injection or discharge. Hydration status is fair.  NECK:  Supple, JVP not seen, no carotid bruits, no palpable lymphadenopathy, no palpable goiter. CHEST:  Clinically clear to auscultation, no wheezes, no crackles. HEART:  Sounds 1 and 2 heard, normal, regular, no murmurs. ABDOMEN:  Full, soft, non-tender, no palpable organomegaly, no palpable masses, normal bowel sounds. GENITALIA:  Not examined. LOWER EXTREMITIES:  No pitting edema, palpable peripheral pulses. MUSCULOSKELETAL SYSTEM:  Generalized osteoarthritic changes, otherwise, normal. CENTRAL NERVOUS SYSTEM:  No focal neurologic deficit on gross examination.    Lab Results:  Basename 07/09/12 0621 07/07/12 0418  WBC 9.0 10.2  HGB 15.1 15.0  HCT 41.8 41.6  PLT 192 182    Basename 07/09/12 0621 07/07/12 0418  NA 135 136  K 3.7 3.9  CL 97 97  CO2 25 29  GLUCOSE 120* 133*  BUN 15 10  CREATININE 0.71 0.89  CALCIUM 8.9 9.1   Recent Results (from the past 240 hour(s))  MRSA PCR SCREENING     Status: Normal   Collection Time   07/01/12 11:00 PM      Component Value Range Status Comment   MRSA by PCR NEGATIVE  NEGATIVE Final   URINE CULTURE     Status: Normal   Collection Time   07/06/12  3:42 AM      Component Value Range Status Comment   Specimen Description URINE,  CATHETERIZED   Final    Special Requests NONE   Final    Culture  Setup Time 07/06/2012 07:24   Final    Colony Count NO GROWTH   Final    Culture NO GROWTH   Final    Report Status 07/07/2012 FINAL   Final   CULTURE, BLOOD (ROUTINE X 2)     Status: Normal   Collection Time   07/06/12  5:16 AM      Component Value Range Status Comment   Specimen Description BLOOD RIGHT ARM   Final    Special Requests BOTTLES DRAWN AEROBIC AND ANAEROBIC 5CC EACH   Final    Culture  Setup Time 07/06/2012 07:29   Final    Culture     Final    Value: BACILLUS SPECIES     Note: Standardized susceptibility testing for this organism is not available.     Note: Gram Stain Report Called to,Read Back By and Verified With: MATT YORK@902PM  07/06/12 BY THOMI   Report Status 07/07/2012 FINAL   Final   CULTURE, BLOOD (ROUTINE X 2)     Status: Normal (Preliminary result)   Collection Time   07/06/12  5:25 AM      Component Value Range Status Comment   Specimen Description  BLOOD LEFT HAND   Final    Special Requests     Final    Value: BOTTLES DRAWN AEROBIC AND ANAEROBIC AER,10ML ANA   Culture  Setup Time 07/06/2012 07:29   Final    Culture     Final    Value:        BLOOD CULTURE RECEIVED NO GROWTH TO DATE CULTURE WILL BE HELD FOR 5 DAYS BEFORE ISSUING A FINAL NEGATIVE REPORT   Report Status PENDING   Incomplete      Studies/Results: No results found.  Medications: Scheduled Meds:    . carvedilol  37.5 mg Oral BID WC  . fluticasone  1 spray Each Nare Daily  . furosemide  60 mg Oral BID  . glyBURIDE  10 mg Oral BID WC  . insulin aspart  0-9 Units Subcutaneous TID WC  . linagliptin  5 mg Oral Daily  . losartan  100 mg Oral Daily  . multivitamin with minerals  1 tablet Oral Daily  . niacin  1,000 mg Oral QHS  . pantoprazole  40 mg Oral QAC breakfast  . simvastatin  10 mg Oral QHS  . sodium chloride  3 mL Intravenous Q12H   Continuous Infusions:  PRN Meds:.acetaminophen, acetaminophen, albuterol,  alum & mag hydroxide-simeth, dextromethorphan-guaiFENesin, nitroGLYCERIN, ondansetron (ZOFRAN) IV, ondansetron, zolpidem    LOS: 3 days   Kattleya Kuhnert,CHRISTOPHER  [facility] Pager [phone]. If 8PM-8AM, please contact night-coverage at [email or web address], password [facility] 07/09/2012, 11:11 AM  LOS: 3 days

## 2012-07-10 DIAGNOSIS — E119 Type 2 diabetes mellitus without complications: Secondary | ICD-10-CM

## 2012-07-10 DIAGNOSIS — I10 Essential (primary) hypertension: Secondary | ICD-10-CM

## 2012-07-10 LAB — BASIC METABOLIC PANEL
BUN: 14 mg/dL (ref 6–23)
CO2: 27 mEq/L (ref 19–32)
Calcium: 9 mg/dL (ref 8.4–10.5)
Chloride: 97 mEq/L (ref 96–112)
Creatinine, Ser: 0.74 mg/dL (ref 0.50–1.35)
GFR calc Af Amer: >90 mL/min (ref >90)
GFR calc non Af Amer: >90 mL/min (ref >90)
Glucose, Bld: 97 mg/dL (ref 70–99)
Potassium: 3.4 mEq/L — ABNORMAL LOW (ref 3.5–5.1)
Sodium: 134 mEq/L — ABNORMAL LOW (ref 135–145)

## 2012-07-10 LAB — CBC
HCT: 41.1 % (ref 39.0–52.0)
Hemoglobin: 15 g/dL (ref 13.0–17.0)
MCH: 31.6 pg (ref 26.0–34.0)
MCHC: 36.5 g/dL — ABNORMAL HIGH (ref 30.0–36.0)
MCV: 86.7 fL (ref 78.0–100.0)
Platelets: 205 10*3/uL (ref 150–400)
RBC: 4.74 MIL/uL (ref 4.22–5.81)
RDW: 12.6 % (ref 11.5–15.5)
WBC: 8.9 10*3/uL (ref 4.0–10.5)

## 2012-07-10 LAB — GLUCOSE, CAPILLARY
Glucose-Capillary: 97 mg/dL (ref 70–99)
Glucose-Capillary: 168 mg/dL — ABNORMAL HIGH (ref 70–99)

## 2012-07-10 MED ORDER — TRAMADOL HCL 50 MG PO TABS: 50.0000 mg | ORAL_TABLET | Freq: Four times a day (QID) | ORAL | Status: DC | PRN

## 2012-07-10 MED ORDER — AMLODIPINE BESYLATE 10 MG PO TABS: 10.0000 mg | ORAL_TABLET | Freq: Every day | ORAL | Status: DC

## 2012-07-10 MED ORDER — NITROGLYCERIN 0.4 MG SL SUBL: 0.4000 mg | SUBLINGUAL_TABLET | SUBLINGUAL | Status: DC | PRN

## 2012-07-10 MED ORDER — POTASSIUM CHLORIDE CRYS ER 20 MEQ PO TBCR
40.0000 meq | EXTENDED_RELEASE_TABLET | Freq: Once | ORAL | Status: AC
  Administered 2012-07-10: 40 meq via ORAL
  Filled 2012-07-10: qty 2

## 2012-07-10 MED ORDER — ESCITALOPRAM OXALATE 10 MG PO TABS: 10.0000 mg | ORAL_TABLET | Freq: Every day | ORAL | Status: DC

## 2012-07-10 NOTE — Progress Notes (Signed)
Bradley [name] [id number] Discharge Data: 07/10/2012 2:21 PM Attending Provider: No att. providers found WUJ:WJXB Drue Novel, MD     Dierdre Searles to be D/C'd Home per MD order.  Discussed with the patient and family the After Visit Summary and all questions fully answered. All IV's discontinued with no bleeding noted. All belongings returned to patient for patient to take home.   Last Vital Signs:  Blood pressure 116/71, pulse 70, temperature 98.1 F (36.7 C), temperature source Oral, resp. rate 18, height 5\' 11"  (1.803 m), weight 94.6 kg (208 lb 8.9 oz), SpO2 96.00%.  Discharge Medication List   Medication List     As of 07/10/2012  2:21 PM    TAKE these medications         amLODipine 10 MG tablet   Commonly known as: NORVASC   Take 1 tablet (10 mg total) by mouth daily.      carvedilol 25 MG tablet   Commonly known as: COREG   Take 1.5 tablets (37.5 mg total) by mouth 2 (two) times daily with a meal.      escitalopram 10 MG tablet   Commonly known as: LEXAPRO   Take 1 tablet (10 mg total) by mouth at bedtime.      furosemide 40 MG tablet   Commonly known as: LASIX   Take 1.5 tablets (60 mg total) by mouth 2 (two) times daily.      glyBURIDE 5 MG tablet   Commonly known as: DIABETA   Take 2 tablets (10 mg total) by mouth 2 (two) times daily with a meal.      losartan 100 MG tablet   Commonly known as: COZAAR   Take 1 tablet (100 mg total) by mouth daily.      multivitamins ther. w/minerals Tabs   Take 1 tablet by mouth daily.      niacin-lovastatin 1000-20 MG 24 hr tablet   Commonly known as: ADVICOR   Take 1 tablet by mouth at bedtime.      nitroGLYCERIN 0.4 MG SL tablet   Commonly known as: NITROSTAT   Place 1 tablet (0.4 mg total) under the tongue every 5 (five) minutes as needed for chest pain.      pantoprazole 40 MG tablet   Commonly known as: PROTONIX   Take 40 mg by mouth every morning.      sitaGLIPtin 100 MG tablet   Commonly known as: JANUVIA   Take 1  tablet (100 mg total) by mouth daily.      traMADol 50 MG tablet   Commonly known as: ULTRAM   Take 1 tablet (50 mg total) by mouth every 6 (six) hours as needed.      zolpidem 10 MG tablet   Commonly known as: AMBIEN   Take 1 tablet (10 mg total) by mouth at bedtime as needed. For sleep        Rosalie Doctor, RN

## 2012-07-10 NOTE — Progress Notes (Signed)
Patient continually gets out of bed and is noncompliant with using call bell.  Patient confused during night, unaware of where he is, talking about his buddy is here for a circumcision, he is going to [employer].  Patient is able to be reoriented.   Will continue to monitor.  Macarthur Critchley, RN

## 2012-07-10 NOTE — Care Management Note (Addendum)
    Page 1 of 1   07/10/2012     5:28:59 PM   CARE MANAGEMENT NOTE 07/10/2012  Patient:  Bradley [name], Bradley [name]   Account Number:  [id number]  Date Initiated:  07/06/2012  Documentation initiated by:  Donn Pierini  Subjective/Objective Assessment:   Pt admitted with AMS, weakness     Action/Plan:   PTA pt lived at home with spouse, PT eval ordered- no pt needs.   Anticipated DC Date:  07/10/2012   Anticipated DC Plan:  HOME W HOME HEALTH SERVICES      DC Planning Services  CM consult      Choice offered to / List presented to:             Status of service:  Completed, signed off Medicare Important Message given?   (If response is "NO", the following Medicare IM given date fields will be blank) Date Medicare IM given:   Date Additional Medicare IM given:    Discharge Disposition:  HOME/SELF CARE  Per UR Regulation:  Reviewed for med. necessity/level of care/duration of stay  If discussed at Long Length of Stay Meetings, dates discussed:    Comments:  PCP- PAZ, JOSE E  07/10/12 11:02 Letha Cape RN, BSN [phone] patient for dc today, patient has medication coverage and transportation.  He lives with spouse.  Patient has no pt needs per physical therapy.  07/06/12- 1100- Donn Pierini RN, BSN [phone] UR completed, PT eval pending- NCM to follow for recommendations and d/c needs/planning

## 2012-07-11 ENCOUNTER — Encounter: Payer: Self-pay | Admitting: Internal Medicine

## 2012-07-11 ENCOUNTER — Telehealth: Payer: Self-pay | Admitting: Internal Medicine

## 2012-07-11 ENCOUNTER — Ambulatory Visit (INDEPENDENT_AMBULATORY_CARE_PROVIDER_SITE_OTHER): Payer: Medicare Other | Admitting: Internal Medicine

## 2012-07-11 VITALS — BP 142/84 | HR 72 | Temp 97.7°F

## 2012-07-11 DIAGNOSIS — M2550 Pain in unspecified joint: Secondary | ICD-10-CM

## 2012-07-11 LAB — CK: Total CK: 28 U/L (ref 7–232)

## 2012-07-11 LAB — SEDIMENTATION RATE: Sed Rate: 19 mm/hr (ref 0–22)

## 2012-07-11 MED ORDER — OXYCODONE-ACETAMINOPHEN 5-325 MG PO TABS: 1.0000 | ORAL_TABLET | ORAL | Status: DC | PRN

## 2012-07-11 NOTE — Telephone Encounter (Signed)
pts wife called states she has lost pt RX for Oxycodone.Rx was filled today pt took one at 330pm and now they can not find. Wife stated she has been thru the car can not fine does not know if she left them in the store or not all she has is the bag.  Cb# [phone]

## 2012-07-11 NOTE — Patient Instructions (Addendum)
Stop Ultram, start Oxycodone, take the least amount possible. Watch for excessive somnolence and falls. Please come back in 2 weeks

## 2012-07-11 NOTE — Progress Notes (Signed)
Subjective:    Patient ID: Bradley [name], male    DOB: December 14, 1940, 71 y.o.   MRN: [id number]  HPI Acute visit Patient was admitted to the hospital by 07-01-2012 to  07/04/2012, found to have an intracranial bleed, he was discharged home, since his discharge he was confused and symptoms were getting worse. He was readmitted 07/06/2012 until 07/10/2012 due to mental status changes, a CT of the head was stable, he was initially febrile but the septic workup was negative. He was noted to be under a lot of stress due to to family issues and was started on Lexapro. Of note, he is no longer a Coumadin candidate.   The reason he is here today is because pain, the pain is located mostly around the shoulders L>R, at the lateral aspect of the hips and buttocks. Interestingly, he also has pain at the parietal  areas when he chews. Pain started at the time of the first admission to the hospital. He is currently on Ultram with no relief.  Chart is reviewed, last BMP show a sodium of 144, CBC yesterday showed no elevated white blood cells, A1c 5.9 During the second admission a UA and urine culture were negative, blood cultures negative as well. Chest x-ray 07/06/2012 with a evidence of pneumonia.  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 W/U included thoracentesis   --09-2011 aCT of the chest, several lymphadenopathies. Sees pulmonary   Leg ulcer, dx end of 2011, ABI neg 10-2010, saw vascular (Dr  Arbie Cookey 10-2010) -- venous insuff. , conservative treatment    Past Surgical History:  Status post large pneumothorax with fibrothorax.    Status post lung decortication -Dr. Laneta Simmers  Status post cardiac catheterization     Family History: MI-- no DM-- F Colon ca-- no prostate ca--no  Social History: Married, 1 son, moved from [place] 2003, lives with his wife.    Tobacco-- quit in the 80s. (used to smoke 2.5 ppd for 25 years) Alcohol-- very rarely No drugs.    Review of Systems Some subjective fever, denies dysuria, gross hematuria. Occasional cough without chest congestion. Denies amaurosis fugax or diplopia. Medication list is reviewed, good compliance Blood sugars ran between 90 to 110.     Objective:   Physical Exam  General -- alert, well-developed, and overweight appearing. No apparent distress.  Head-Neck --atraumatic, not tender at the temples, good TA pulses. HEENT --  not pale or jaundice Lungs -- normal respiratory effort, no intercostal retractions, no accessory muscle use, and normal breath sounds.   Heart-- normal rate, regular rhythm, no murmur, and no gallop.   Abdomen--soft, non-tender, no distention, no masses, no HSM, no guarding, and no rigidity.   Extremities-- no edema, buttocks are inspected and there is no skin breakdown or redness, no tenderness to palpation. There is some tenderness around the left shoulder to palpation; not obvious tenderness at the trochanteric bursas. Neurologic-- alert & oriented  X3 sleepy at times but he was able to carry on a conversation with me and explain his symptoms. Gait not tested      Assessment & Plan:

## 2012-07-11 NOTE — Telephone Encounter (Signed)
Spoke with pt's wife & she found the medication. The #10 prescription has been destroyed.

## 2012-07-11 NOTE — Assessment & Plan Note (Addendum)
71 year old gentleman with a recent  intracranial bleed, history of A fibrillation, diabetes, now off Coumadin presents with what seems to be severe pain around the shoulders and hips, also what seems to be jaw claudication. I'm suspicious of PMR. Recent septic workup was negative The last sed rate we have available is 35 on November 2012. Plan: Discontinue Ultram Needs better pain control, I'm somehow reluctant to prescribe pain medication because he is a little sleepy but he does seems to be in a lot of pain. We talk about hydrocodone but the family reports he responds better to oxycodone consequently that is what we are going to use. Asked to be extremely careful about falls, also this medication is addictive and it needs to stay away from any family member that has issues w/ addiction Check a sedimentation rate and CKs, further advice will results. If tests normal, we'll have to consider orthopedic referral, versus further evaluation

## 2012-07-12 LAB — CULTURE, BLOOD (ROUTINE X 2): Culture: NO GROWTH

## 2012-07-19 ENCOUNTER — Other Ambulatory Visit: Payer: Self-pay

## 2012-07-19 MED ORDER — OXYCODONE-ACETAMINOPHEN 5-325 MG PO TABS: 1.0000 | ORAL_TABLET | ORAL | Status: DC | PRN

## 2012-07-19 NOTE — Telephone Encounter (Signed)
pt called stated he has accidentially destroyed his RX for Oxycodone. Pt stated he got up because his hip was hurting so bad took one the bottle slipped from his hand and the pills got wet and are no longer any good. Pt states he would like to get a new RX for Oxycodone today, cb# [phone]

## 2012-07-19 NOTE — Telephone Encounter (Signed)
He got #30, no refills few days ago. (we also did a  #10 prescription after he lost his #30 Rx which he later on found, the #10 was destroyed)  Advise patient, will refill #20, enough last untilt he see me next, he is due for a followup around 07/25/2012. Again remind the patient that the prescription and the medication need to be keep away from any other family member except his wife; I won't  be able to prescribe this medication if these incidents keep happening

## 2012-07-19 NOTE — Telephone Encounter (Signed)
Discussed with pt's wife. She understood.

## 2012-07-19 NOTE — Telephone Encounter (Signed)
Pt states dropped meds in toilet by accident need refill. Last OV & filled 07/11/12 #10 no refills. Plz advise   MW

## 2012-07-23 ENCOUNTER — Emergency Department (HOSPITAL_BASED_OUTPATIENT_CLINIC_OR_DEPARTMENT_OTHER): Payer: Medicare Other

## 2012-07-23 ENCOUNTER — Encounter (HOSPITAL_BASED_OUTPATIENT_CLINIC_OR_DEPARTMENT_OTHER): Payer: Self-pay | Admitting: Student

## 2012-07-23 ENCOUNTER — Inpatient Hospital Stay (HOSPITAL_BASED_OUTPATIENT_CLINIC_OR_DEPARTMENT_OTHER)
Admission: EM | Admit: 2012-07-23 | Discharge: 2012-07-26 | DRG: 313 | Disposition: A | Discharge status: Home / Self Care | Payer: Medicare Other | Attending: Internal Medicine | Admitting: Internal Medicine

## 2012-07-23 DIAGNOSIS — R0682 Tachypnea, not elsewhere classified: Secondary | ICD-10-CM

## 2012-07-23 DIAGNOSIS — I615 Nontraumatic intracerebral hemorrhage, intraventricular: Secondary | ICD-10-CM

## 2012-07-23 DIAGNOSIS — E119 Type 2 diabetes mellitus without complications: Secondary | ICD-10-CM

## 2012-07-23 DIAGNOSIS — Z0000 Encounter for general adult medical examination without abnormal findings: Secondary | ICD-10-CM

## 2012-07-23 DIAGNOSIS — J9610 Chronic respiratory failure, unspecified whether with hypoxia or hypercapnia: Secondary | ICD-10-CM

## 2012-07-23 DIAGNOSIS — I5022 Chronic systolic (congestive) heart failure: Secondary | ICD-10-CM

## 2012-07-23 DIAGNOSIS — Z23 Encounter for immunization: Secondary | ICD-10-CM

## 2012-07-23 DIAGNOSIS — I5032 Chronic diastolic (congestive) heart failure: Secondary | ICD-10-CM

## 2012-07-23 DIAGNOSIS — R918 Other nonspecific abnormal finding of lung field: Secondary | ICD-10-CM

## 2012-07-23 DIAGNOSIS — R972 Elevated prostate specific antigen [PSA]: Secondary | ICD-10-CM

## 2012-07-23 DIAGNOSIS — N400 Enlarged prostate without lower urinary tract symptoms: Secondary | ICD-10-CM

## 2012-07-23 DIAGNOSIS — G936 Cerebral edema: Secondary | ICD-10-CM

## 2012-07-23 DIAGNOSIS — R079 Chest pain, unspecified: Secondary | ICD-10-CM

## 2012-07-23 DIAGNOSIS — I495 Sick sinus syndrome: Secondary | ICD-10-CM

## 2012-07-23 DIAGNOSIS — F528 Other sexual dysfunction not due to a substance or known physiological condition: Secondary | ICD-10-CM

## 2012-07-23 DIAGNOSIS — I10 Essential (primary) hypertension: Secondary | ICD-10-CM | POA: Diagnosis present

## 2012-07-23 DIAGNOSIS — E669 Obesity, unspecified: Secondary | ICD-10-CM | POA: Diagnosis present

## 2012-07-23 DIAGNOSIS — I5023 Acute on chronic systolic (congestive) heart failure: Secondary | ICD-10-CM

## 2012-07-23 DIAGNOSIS — R634 Abnormal weight loss: Secondary | ICD-10-CM

## 2012-07-23 DIAGNOSIS — E876 Hypokalemia: Secondary | ICD-10-CM

## 2012-07-23 DIAGNOSIS — G4700 Insomnia, unspecified: Secondary | ICD-10-CM

## 2012-07-23 DIAGNOSIS — I4891 Unspecified atrial fibrillation: Secondary | ICD-10-CM

## 2012-07-23 DIAGNOSIS — IMO0002 Reserved for concepts with insufficient information to code with codable children: Secondary | ICD-10-CM

## 2012-07-23 DIAGNOSIS — I8310 Varicose veins of unspecified lower extremity with inflammation: Secondary | ICD-10-CM

## 2012-07-23 DIAGNOSIS — E1169 Type 2 diabetes mellitus with other specified complication: Secondary | ICD-10-CM | POA: Diagnosis present

## 2012-07-23 DIAGNOSIS — M2550 Pain in unspecified joint: Secondary | ICD-10-CM

## 2012-07-23 DIAGNOSIS — I739 Peripheral vascular disease, unspecified: Secondary | ICD-10-CM

## 2012-07-23 DIAGNOSIS — R4182 Altered mental status, unspecified: Secondary | ICD-10-CM

## 2012-07-23 DIAGNOSIS — Z833 Family history of diabetes mellitus: Secondary | ICD-10-CM

## 2012-07-23 DIAGNOSIS — Z87891 Personal history of nicotine dependence: Secondary | ICD-10-CM

## 2012-07-23 DIAGNOSIS — Z950 Presence of cardiac pacemaker: Secondary | ICD-10-CM

## 2012-07-23 DIAGNOSIS — I509 Heart failure, unspecified: Secondary | ICD-10-CM

## 2012-07-23 DIAGNOSIS — I5043 Acute on chronic combined systolic (congestive) and diastolic (congestive) heart failure: Secondary | ICD-10-CM | POA: Diagnosis present

## 2012-07-23 DIAGNOSIS — E785 Hyperlipidemia, unspecified: Secondary | ICD-10-CM

## 2012-07-23 DIAGNOSIS — R591 Generalized enlarged lymph nodes: Secondary | ICD-10-CM

## 2012-07-23 DIAGNOSIS — R0789 Other chest pain: Principal | ICD-10-CM | POA: Diagnosis present

## 2012-07-23 DIAGNOSIS — I5041 Acute combined systolic (congestive) and diastolic (congestive) heart failure: Secondary | ICD-10-CM

## 2012-07-23 DIAGNOSIS — Z8249 Family history of ischemic heart disease and other diseases of the circulatory system: Secondary | ICD-10-CM

## 2012-07-23 DIAGNOSIS — I428 Other cardiomyopathies: Secondary | ICD-10-CM

## 2012-07-23 DIAGNOSIS — I2789 Other specified pulmonary heart diseases: Secondary | ICD-10-CM

## 2012-07-23 HISTORY — DX: Anemia, unspecified: D64.9

## 2012-07-23 HISTORY — DX: Unspecified osteoarthritis, unspecified site: M19.90

## 2012-07-23 HISTORY — DX: Peripheral vascular disease, unspecified: I73.9

## 2012-07-23 LAB — PRO B NATRIURETIC PEPTIDE: Pro B Natriuretic peptide (BNP): 566 pg/mL — ABNORMAL HIGH (ref 0–125)

## 2012-07-23 LAB — CBC WITH DIFFERENTIAL/PLATELET
Basophils Absolute: 0 10*3/uL (ref 0.0–0.1)
Basophils Relative: 0 % (ref 0–1)
Eosinophils Absolute: 0.1 10*3/uL (ref 0.0–0.7)
Eosinophils Relative: 1 % (ref 0–5)
HCT: 42.1 % (ref 39.0–52.0)
Hemoglobin: 15.3 g/dL (ref 13.0–17.0)
Lymphocytes Relative: 20 % (ref 12–46)
Lymphs Abs: 1.4 10*3/uL (ref 0.7–4.0)
MCH: 31.2 pg (ref 26.0–34.0)
MCHC: 36.3 g/dL — ABNORMAL HIGH (ref 30.0–36.0)
MCV: 85.9 fL (ref 78.0–100.0)
Monocytes Absolute: 0.7 10*3/uL (ref 0.1–1.0)
Monocytes Relative: 10 % (ref 3–12)
Neutro Abs: 4.9 10*3/uL (ref 1.7–7.7)
Neutrophils Relative %: 69 % (ref 43–77)
Platelets: 233 10*3/uL (ref 150–400)
RBC: 4.9 MIL/uL (ref 4.22–5.81)
RDW: 12.5 % (ref 11.5–15.5)
WBC: 7.2 10*3/uL (ref 4.0–10.5)

## 2012-07-23 LAB — COMPREHENSIVE METABOLIC PANEL
ALT: 18 U/L (ref 0–53)
AST: 17 U/L (ref 0–37)
Albumin: 3.6 g/dL (ref 3.5–5.2)
Alkaline Phosphatase: 76 U/L (ref 39–117)
BUN: 19 mg/dL (ref 6–23)
CO2: 26 mEq/L (ref 19–32)
Calcium: 9.3 mg/dL (ref 8.4–10.5)
Chloride: 101 mEq/L (ref 96–112)
Creatinine, Ser: 0.9 mg/dL (ref 0.50–1.35)
GFR calc Af Amer: >90 mL/min (ref >90)
GFR calc non Af Amer: 84 mL/min — ABNORMAL LOW (ref >90)
Glucose, Bld: 143 mg/dL — ABNORMAL HIGH (ref 70–99)
Potassium: 4 mEq/L (ref 3.5–5.1)
Sodium: 138 mEq/L (ref 135–145)
Total Bilirubin: 0.6 mg/dL (ref 0.3–1.2)
Total Protein: 7.2 g/dL (ref 6.0–8.3)

## 2012-07-23 LAB — TROPONIN I
Troponin I: <0.3 ng/mL (ref <0.30)
Troponin I: <0.3 ng/mL (ref <0.30)

## 2012-07-23 LAB — GLUCOSE, CAPILLARY: Glucose-Capillary: 123 mg/dL — ABNORMAL HIGH (ref 70–99)

## 2012-07-23 LAB — MRSA PCR SCREENING: MRSA by PCR: NEGATIVE

## 2012-07-23 MED ORDER — NIACIN-LOVASTATIN ER 1000-20 MG PO TB24: 1.0000 | ORAL_TABLET | Freq: Every day | ORAL | Status: DC

## 2012-07-23 MED ORDER — ONDANSETRON HCL 4 MG/2ML IJ SOLN: 4.0000 mg | Freq: Four times a day (QID) | INTRAMUSCULAR | Status: DC | PRN

## 2012-07-23 MED ORDER — NITROGLYCERIN 0.4 MG SL SUBL
0.4000 mg | SUBLINGUAL_TABLET | SUBLINGUAL | Status: DC | PRN
  Administered 2012-07-23: 0.4 mg via SUBLINGUAL
  Filled 2012-07-23: qty 25

## 2012-07-23 MED ORDER — SODIUM CHLORIDE 0.9 % IJ SOLN
3.0000 mL | Freq: Two times a day (BID) | INTRAMUSCULAR | Status: DC
  Administered 2012-07-24 – 2012-07-26 (×5): 3 mL via INTRAVENOUS

## 2012-07-23 MED ORDER — NITROGLYCERIN 0.4 MG/HR TD PT24
0.4000 mg | MEDICATED_PATCH | Freq: Every day | TRANSDERMAL | Status: DC
  Administered 2012-07-24: 0.4 mg via TRANSDERMAL
  Filled 2012-07-23: qty 1

## 2012-07-23 MED ORDER — AMLODIPINE BESYLATE 10 MG PO TABS
10.0000 mg | ORAL_TABLET | Freq: Every day | ORAL | Status: DC
  Administered 2012-07-24: 10 mg via ORAL
  Filled 2012-07-23: qty 1

## 2012-07-23 MED ORDER — CARVEDILOL 25 MG PO TABS
37.5000 mg | ORAL_TABLET | Freq: Two times a day (BID) | ORAL | Status: DC
  Administered 2012-07-24 – 2012-07-26 (×6): 37.5 mg via ORAL
  Filled 2012-07-23 (×7): qty 1

## 2012-07-23 MED ORDER — ONDANSETRON HCL 4 MG PO TABS: 4.0000 mg | ORAL_TABLET | Freq: Four times a day (QID) | ORAL | Status: DC | PRN

## 2012-07-23 MED ORDER — ZOLPIDEM TARTRATE 5 MG PO TABS
10.0000 mg | ORAL_TABLET | Freq: Every evening | ORAL | Status: DC | PRN
  Administered 2012-07-24: 5 mg via ORAL
  Filled 2012-07-23: qty 1

## 2012-07-23 MED ORDER — LOSARTAN POTASSIUM 50 MG PO TABS
100.0000 mg | ORAL_TABLET | Freq: Every day | ORAL | Status: DC
  Administered 2012-07-24 – 2012-07-26 (×3): 100 mg via ORAL
  Filled 2012-07-23 (×3): qty 2

## 2012-07-23 MED ORDER — SODIUM CHLORIDE 0.9 % IJ SOLN: 3.0000 mL | Freq: Two times a day (BID) | INTRAMUSCULAR | Status: DC

## 2012-07-23 MED ORDER — ACETAMINOPHEN 650 MG RE SUPP: 650.0000 mg | Freq: Four times a day (QID) | RECTAL | Status: DC | PRN

## 2012-07-23 MED ORDER — PANTOPRAZOLE SODIUM 40 MG PO TBEC
40.0000 mg | DELAYED_RELEASE_TABLET | Freq: Every morning | ORAL | Status: DC
  Administered 2012-07-24 – 2012-07-26 (×3): 40 mg via ORAL
  Filled 2012-07-23 (×3): qty 1

## 2012-07-23 MED ORDER — TRAMADOL HCL 50 MG PO TABS
50.0000 mg | ORAL_TABLET | Freq: Four times a day (QID) | ORAL | Status: DC | PRN
  Administered 2012-07-24: 50 mg via ORAL
  Filled 2012-07-23 (×2): qty 1

## 2012-07-23 MED ORDER — GLYBURIDE 5 MG PO TABS
10.0000 mg | ORAL_TABLET | Freq: Two times a day (BID) | ORAL | Status: DC
  Administered 2012-07-24 – 2012-07-26 (×5): 10 mg via ORAL
  Filled 2012-07-23 (×7): qty 2

## 2012-07-23 MED ORDER — FUROSEMIDE 10 MG/ML IJ SOLN
40.0000 mg | Freq: Two times a day (BID) | INTRAMUSCULAR | Status: DC
  Administered 2012-07-24 (×2): 40 mg via INTRAVENOUS
  Filled 2012-07-23 (×3): qty 4

## 2012-07-23 MED ORDER — LINAGLIPTIN 5 MG PO TABS
5.0000 mg | ORAL_TABLET | Freq: Every day | ORAL | Status: DC
  Administered 2012-07-24 – 2012-07-26 (×3): 5 mg via ORAL
  Filled 2012-07-23 (×3): qty 1

## 2012-07-23 MED ORDER — SIMVASTATIN 10 MG PO TABS
10.0000 mg | ORAL_TABLET | Freq: Every day | ORAL | Status: DC
  Administered 2012-07-24 – 2012-07-25 (×2): 10 mg via ORAL
  Filled 2012-07-23 (×3): qty 1

## 2012-07-23 MED ORDER — INSULIN ASPART 100 UNIT/ML ~~LOC~~ SOLN
0.0000 [IU] | Freq: Three times a day (TID) | SUBCUTANEOUS | Status: DC
  Administered 2012-07-26: 2 [IU] via SUBCUTANEOUS

## 2012-07-23 MED ORDER — ACETAMINOPHEN 325 MG PO TABS: 650.0000 mg | ORAL_TABLET | Freq: Four times a day (QID) | ORAL | Status: DC | PRN

## 2012-07-23 MED ORDER — OXYCODONE-ACETAMINOPHEN 5-325 MG PO TABS
1.0000 | ORAL_TABLET | ORAL | Status: DC | PRN
Start: 2012-07-23 — End: 2012-07-24

## 2012-07-23 MED ORDER — ADULT MULTIVITAMIN W/MINERALS CH
1.0000 | ORAL_TABLET | Freq: Every day | ORAL | Status: DC
  Administered 2012-07-24: 1 via ORAL
  Filled 2012-07-23 (×4): qty 1

## 2012-07-23 MED ORDER — NIACIN 500 MG PO TABS
1000.0000 mg | ORAL_TABLET | Freq: Every day | ORAL | Status: DC
  Administered 2012-07-24 – 2012-07-25 (×2): 1000 mg via ORAL
  Filled 2012-07-23 (×3): qty 2

## 2012-07-23 MED ORDER — FUROSEMIDE 10 MG/ML IJ SOLN
40.0000 mg | Freq: Once | INTRAMUSCULAR | Status: AC
  Administered 2012-07-23: 40 mg via INTRAVENOUS
  Filled 2012-07-23: qty 4

## 2012-07-23 MED ORDER — ESCITALOPRAM OXALATE 10 MG PO TABS
10.0000 mg | ORAL_TABLET | Freq: Every day | ORAL | Status: DC
  Administered 2012-07-24 – 2012-07-25 (×3): 10 mg via ORAL
  Filled 2012-07-23 (×4): qty 1

## 2012-07-23 NOTE — ED Notes (Signed)
Pt reports pain relief to 0 after once dose of SL Nitro

## 2012-07-23 NOTE — ED Notes (Signed)
V/O rcvd and verified from Dr Fredderick Phenix to current writer for [facility] stepdown bed, not ICU bed.

## 2012-07-23 NOTE — Progress Notes (Signed)
Bradley [name] is a 71 yo M with hx of CHF, HTN, HLD, atrial fibrillation not a candidate for A/C, DM, and recent hospitalizations for right temporal horn intraventricular hemorrhage in September who presented to [facility] with chest pain that partially responded to 3 NTG.  ECG was stable and troponin #1 neg.  CXR demonstrated mild pulmonary vascular congestion and stable cardiomegaly.  Patient continues to feel SOB and have 2/10 chs and RR in 30s.  No exam findings consistent with PE per report.  Transfer to stepdown for CP w/u and diuresis given tachypnea and ongoing CP.

## 2012-07-23 NOTE — ED Notes (Signed)
Patient vitals taken and blood sugar taken.

## 2012-07-23 NOTE — ED Provider Notes (Signed)
History  This chart was scribed for Bradley Bucco, MD by [name]. The patient was seen in room MH07/MH07. Patient's care was started at 1534.     CSN: [id number]  Arrival date & time 07/23/12  1508   First MD Initiated Contact with Patient 07/23/12 1534      Chief Complaint  Patient presents with  . Chest Pain     The history is provided by the patient and the spouse. No language interpreter was used.    Bradley [name] is a 71 y.o. male with a pacemaker who presents to the Emergency Department complaining of intermittent, pressure-like, non-radiating, moderate left chest pain onset 2 days ago. Patient states that pain is mild at this time. There is associated moderate to severe SOB, mild HA, constipation and rhinorrhea. He denies diaphoresis, fever, cough, hematuria, dysuria, blood in stool or emesis. Patient clarified that his HAs are not new and are associated with his recent diagnosis of brain hematoma and bleeding. He states that HA pain has actually been improving. Patient states that he has been having difficulty ambulating and has noticed darkened color changes on his lower legs bilaterally. Patient has nitroglycerin at home, but he has not taken it for pain relief. Patient takes Aspirin at home and says that he took 2 today.  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 Patient denies history of heart attacks, stents or angioplasty. He has a medical history of HTN, hyperlipidemia, diabetes, CHF, pleural effusion, insomnia and atrial fibrillation. He is a former smoker.   Past Medical History  Diagnosis Date  . HTN (hypertension)   . HLD (hyperlipidemia)   . DM (diabetes mellitus) 1999    adult onset  . BPH (benign prostatic hyperplasia)     Saw Dr [name] 2004, normal renal u/s  . Leg ulcer 2011  . CHF (congestive heart failure)     Thought primarily to be non-systolic although EF down (EF 47-82% 12/2010, down to 35-40%  09/05/11), cath 2008 with no CAD, nuclear study 07/2011 showing Small area of reversibility in the distal ant/lat wall the left ventricle suspicious for ischemia/septal wall HK but felt to be low risk  (per D/C Summary 07/2011)  . Pleural effusion 2008    S/p decortication  . Insomnia   . Atrial fibrillation     s/p AV node ablation & BiV PPM implantation 09/08/11 (op dictation pending)  . Sinoatrial node dysfunction   . Headache   . Pulmonary HTN     Past Surgical History  Procedure Date  . Pleural scarification   . Lung decortication   . Colonoscopy 03/10/11    normal  . Pneumothorax with fibrothorax   . Insert / replace / remove pacemaker 09/08/11    pacemaker placement    Family History  Problem Relation Age of Onset  . Diabetes Father   . Breast cancer Maternal Aunt   . Heart attack Neg Hx   . Prostate cancer Neg Hx   . Colon cancer Neg Hx   . Cancer Neg Hx     colon or prostate  . Coronary artery disease Father   . Polycythemia Mother     History  Substance Use Topics  . Smoking status: Former Smoker -- 2.5 packs/day for 25 years    Types: Cigarettes    Quit date: 10/11/1980  . Smokeless tobacco: Never Used  . Alcohol Use: Yes     occassionally once a year  Review of Systems  Constitutional: Negative for fever, chills, diaphoresis and fatigue.  HENT: Positive for rhinorrhea. Negative for congestion and sneezing.   Eyes: Negative.   Respiratory: Positive for shortness of breath. Negative for cough and chest tightness.   Cardiovascular: Positive for chest pain. Negative for leg swelling.  Gastrointestinal: Positive for constipation. Negative for nausea, vomiting, abdominal pain, diarrhea and blood in stool.  Genitourinary: Negative for dysuria, frequency, hematuria, flank pain and difficulty urinating.  Musculoskeletal: Negative for back pain and arthralgias.  Skin: Negative for rash.  Neurological: Positive for headaches. Negative for dizziness, speech  difficulty, weakness and numbness.    Allergies  Avelox and Tadalafil  Home Medications   Current Outpatient Rx  Name Route Sig Dispense Refill  . AMLODIPINE BESYLATE 10 MG PO TABS Oral Take 1 tablet (10 mg total) by mouth daily. 30 tablet 1  . CARVEDILOL 25 MG PO TABS Oral Take 1.5 tablets (37.5 mg total) by mouth 2 (two) times daily with a meal. 270 tablet 3  . ESCITALOPRAM OXALATE 10 MG PO TABS Oral Take 1 tablet (10 mg total) by mouth at bedtime. 30 tablet 1  . FUROSEMIDE 40 MG PO TABS Oral Take 1.5 tablets (60 mg total) by mouth 2 (two) times daily. 270 tablet 3  . GLYBURIDE 5 MG PO TABS Oral Take 2 tablets (10 mg total) by mouth 2 (two) times daily with a meal. 360 tablet 3  . LOSARTAN POTASSIUM 100 MG PO TABS Oral Take 1 tablet (100 mg total) by mouth daily. 90 tablet 3  . THERA M PLUS PO TABS Oral Take 1 tablet by mouth daily.      Marland Kitchen NIACIN-LOVASTATIN ER 1000-20 MG PO TB24 Oral Take 1 tablet by mouth at bedtime. 90 tablet 3  . NITROGLYCERIN 0.4 MG SL SUBL Sublingual Place 1 tablet (0.4 mg total) under the tongue every 5 (five) minutes as needed for chest pain. 90 tablet 0  . OXYCODONE-ACETAMINOPHEN 5-325 MG PO TABS Oral Take 1 tablet by mouth every 4 (four) hours as needed for pain. 20 tablet 0  . PANTOPRAZOLE SODIUM 40 MG PO TBEC Oral Take 40 mg by mouth every morning.    Marland Kitchen SITAGLIPTIN PHOSPHATE 100 MG PO TABS Oral Take 1 tablet (100 mg total) by mouth daily. 90 tablet 3  . TRAMADOL HCL 50 MG PO TABS Oral Take 1 tablet (50 mg total) by mouth every 6 (six) hours as needed. 30 tablet 0  . ZOLPIDEM TARTRATE 10 MG PO TABS Oral Take 1 tablet (10 mg total) by mouth at bedtime as needed. For sleep 90 tablet 1    BP 119/68  Pulse 72  Temp 97.6 F (36.4 C) (Oral)  Resp 22  Wt 210 lb (95.255 kg)  SpO2 100%  Physical Exam  Constitutional: He is oriented to person, place, and time. He appears well-developed and well-nourished.  HENT:  Head: Normocephalic and atraumatic.  Eyes:  Pupils are equal, round, and reactive to light.  Neck: Normal range of motion. Neck supple.  Cardiovascular: Normal rate, regular rhythm and normal heart sounds.   Pulmonary/Chest: Effort normal and breath sounds normal. No respiratory distress. He has no wheezes. He has no rales. He exhibits no tenderness.       Slight increased work of breathing.  Abdominal: Soft. Bowel sounds are normal. There is no tenderness. There is no rebound and no guarding.  Musculoskeletal: Normal range of motion. He exhibits no edema.  Lymphadenopathy:    He has no  cervical adenopathy.  Neurological: He is alert and oriented to person, place, and time.  Skin: Skin is warm and dry. No rash noted.  Psychiatric: He has a normal mood and affect.    ED Course  Procedures (including critical care time) DIAGNOSTIC STUDIES: Oxygen Saturation is 99% on room air, normal by my interpretation.    COORDINATION OF CARE: 3:58pm- Patient and wife informed of current plan for treatment and evaluation and agrees with plan at this time.    Results for orders placed during the hospital encounter of 07/23/12  CBC WITH DIFFERENTIAL      Component Value Range   WBC 7.2  4.0 - 10.5 K/uL   RBC 4.90  4.22 - 5.81 MIL/uL   Hemoglobin 15.3  13.0 - 17.0 g/dL   HCT 16.1  09.6 - 04.5 %   MCV 85.9  78.0 - 100.0 fL   MCH 31.2  26.0 - 34.0 pg   MCHC 36.3 (*) 30.0 - 36.0 g/dL   RDW 40.9  81.1 - 91.4 %   Platelets 233  150 - 400 K/uL   Neutrophils Relative 69  43 - 77 %   Neutro Abs 4.9  1.7 - 7.7 K/uL   Lymphocytes Relative 20  12 - 46 %   Lymphs Abs 1.4  0.7 - 4.0 K/uL   Monocytes Relative 10  3 - 12 %   Monocytes Absolute 0.7  0.1 - 1.0 K/uL   Eosinophils Relative 1  0 - 5 %   Eosinophils Absolute 0.1  0.0 - 0.7 K/uL   Basophils Relative 0  0 - 1 %   Basophils Absolute 0.0  0.0 - 0.1 K/uL  COMPREHENSIVE METABOLIC PANEL      Component Value Range   Sodium 138  135 - 145 mEq/L   Potassium 4.0  3.5 - 5.1 mEq/L   Chloride 101   96 - 112 mEq/L   CO2 26  19 - 32 mEq/L   Glucose, Bld 143 (*) 70 - 99 mg/dL   BUN 19  6 - 23 mg/dL   Creatinine, Ser 7.82  0.50 - 1.35 mg/dL   Calcium 9.3  8.4 - 95.6 mg/dL   Total Protein 7.2  6.0 - 8.3 g/dL   Albumin 3.6  3.5 - 5.2 g/dL   AST 17  0 - 37 U/L   ALT 18  0 - 53 U/L   Alkaline Phosphatase 76  39 - 117 U/L   Total Bilirubin 0.6  0.3 - 1.2 mg/dL   GFR calc non Af Amer 84 (*) >90 mL/min   GFR calc Af Amer >90  >90 mL/min  PRO B NATRIURETIC PEPTIDE      Component Value Range   Pro B Natriuretic peptide (BNP) 566.0 (*) 0 - 125 pg/mL  TROPONIN I      Component Value Range   Troponin I <0.30  <0.30 ng/mL   Ct Angio Head W/cm &/or Wo Cm  07/02/2012  *RADIOLOGY REPORT*  Clinical Data:  No intracranial bleed.  CT ANGIOGRAPHY HEAD  Technique:  Multidetector CT imaging of the head was performed using the standard protocol during bolus administration of intravenous contrast.  Multiplanar CT image reconstructions including MIPs were obtained to evaluate the vascular anatomy.  Contrast: 50mL OMNIPAQUE IOHEXOL 350 MG/ML SOLN  Comparison:  07/02/2012 head CT.  Findings:  Hematoma is located within the mid and anterior right temporal horn which is expanded with surrounding vasogenic edema. I suspect that the hemorrhage is related  to the choroid.  There is slight prominence of the right anterior choroidal artery compared to the left.  This may reflect the presence of underlying vascular malformation not well demonstrated on the present exam or secondary to blood supply of underlying choroidal mass.  Attention to these possibility on follow-up as hemorrhage clears is recommended.  Small amount of subarachnoid blood is seen in the dependent aspect of the lateral ventricles.  Left internal carotid artery is dilated just proximal to the petrous segment raising possibility fibromuscular dysplasia. Alternatively this may relate to atherosclerotic type changes.  Marked ectasia of the vertebral arteries and  basilar artery.  Internal carotid artery cavernous segment is ectatic bilaterally.  No intracranial enhancing mass separate from the above described findings.  Prominent small vessel disease type changes.  Remote infarct anterior limb left internal capsule suspected.  Fluid level within the left maxillary sinus.   Review of the MIP images confirms the above findings.  IMPRESSION:  Hematoma is located within the mid and anterior right temporal horn which is expanded with surrounding vasogenic edema.  I suspect that the hemorrhage is related to the choroid.  There is slight prominence of the right anterior choroidal artery compared to the left.  This may reflect the presence of underlying vascular malformation not well demonstrated on the present exam or secondary to blood supply of underlying choroidal mass.  Attention to these possibilities on follow-up imaging recommended after hemorrhage has cleared.  Small amount of subarachnoid blood is seen in the dependent aspect of the lateral ventricles.  Left internal carotid artery is dilated just proximal to the petrous segment raising possibility fibromuscular dysplasia. Alternatively this may relate to atherosclerotic type changes.   Original Report Authenticated By: Fuller Canada, M.D.    Dg Chest 2 View  07/23/2012  *RADIOLOGY REPORT*  Clinical Data: Chest pain.  CHEST - 2 VIEW  Comparison: One-view chest 07/06/2012.  Findings: Mild cardiomegaly is again noted.  The mild pulmonary vascular congestion is similar to the prior study.  Chronic pleural thickening is stable.  Pacing wires are in place.  The visualized soft tissues and bony thorax are unremarkable.  IMPRESSION:  1.  Stable cardiomegaly and mild pulmonary vascular congestion. 2.  Stable pleural thickening.   Original Report Authenticated By: Jamesetta Orleans. MATTERN, M.D.    Dg Chest 2 View  07/01/2012  *RADIOLOGY REPORT*  Clinical Data: Chest pain  CHEST - 2 VIEW  Comparison: 10/28/2011  Findings:  Cardiomegaly again noted.  No acute infiltrate or pulmonary edema.  Stable chronic blunting of the costophrenic angles.  Dual lead cardiac pacemaker is unchanged in position.  IMPRESSION: No acute infiltrate or pulmonary edema.  Stable chronic blunting of the costophrenic angles.  Dual lead cardiac pacemaker is unchanged in position.   Original Report Authenticated By: Natasha Mead, M.D.    Dg Abd 1 View  07/01/2012  *RADIOLOGY REPORT*  Clinical Data: Abdominal pain  ABDOMEN - 1 VIEW  Comparison: None.  Findings: There is nonspecific nonobstructive bowel gas pattern. Moderate stool noted in the right colon.  No free abdominal air.  IMPRESSION: Nonspecific nonobstructive bowel gas pattern.  Moderate stool noted in the right colon.   Original Report Authenticated By: Natasha Mead, M.D.    Ct Head Wo Contrast  07/06/2012  *RADIOLOGY REPORT*  Clinical Data: Generalized weakness.  Headache.  Short of breath. History of hematoma.  CT HEAD WITHOUT CONTRAST  Technique:  Contiguous axial images were obtained from the base of the skull through  the vertex without contrast.  Comparison: 07/02/2012.  Findings: Expected evolution of posterior right temporal horn hemorrhage.  Intraventricular blood remains present, layering dependently in the lateral ventricles.  Ventricular size is unchanged compared to prior exam.  Vasogenic edema around the hemorrhage appears similar.  Unchanged left basal ganglia lacunar infarct.  No midline shift.  No new hemorrhages identified.  No evidence of acute infarction.  Small fluid level or mucosal thickening is present in the left maxillary sinus, similar to prior.  Fluid in the right mastoid air cells.  IMPRESSION: Expected evolution of right temporal horn intraventricular hemorrhage with dependently layering intraventricular blood.   Original Report Authenticated By: Andreas Newport, M.D.    Ct Head Wo Contrast  07/02/2012  *RADIOLOGY REPORT*  Clinical Data: Intracranial hemorrhage follow-up.   Mild right-sided weakness began 3 weeks ago.  CT HEAD WITHOUT CONTRAST  Technique:  Contiguous axial images were obtained from the base of the skull through the vertex without contrast.  Comparison: 07/01/2012.  Findings: Blood within expanded right temporal horn unchanged. Mild surrounding vasogenic edema.  Cause indeterminate as previously discussed.  Dependent interventricular blood.  Atrophy and ventricular size similar to the prior exam.  No new intracranial hemorrhage detected.  Prominent small vessel disease type changes.  IMPRESSION:  Blood within expanded right temporal horn unchanged.  Mild surrounding vasogenic edema.  Dependent interventricular blood.  No new intracranial hemorrhage detected.   Original Report Authenticated By: Fuller Canada, M.D.    Ct Head Wo Contrast  07/01/2012  *RADIOLOGY REPORT*  Clinical Data: Headache for several days.  Diabetic hypertensive.  CT HEAD WITHOUT CONTRAST  Technique:  Contiguous axial images were obtained from the base of the skull through the vertex without contrast.  Comparison: 03/29/2009.  Findings: Right mid to anterior temporal horn hematoma with surrounding vasogenic edema.  Etiology indeterminate.  This may be related to a hypertensive hemorrhage, congophillic/amyloid angiopathy or result of underlying mass/vascular malformation which is not detected on the present examination or the prior unenhanced head CT.  The patient would benefit from follow-up until this clears to evaluate for possible underlying lesion.  Small amount of blood is seen in the dependent aspect of the occipital horns bilaterally.  The ventricular system is slightly more prominent than on prior examination which may be related atrophy rather hydrocephalus and can be accessed on follow-up.  Prominent small vessel disease type changes.  Remote left basal ganglia infarct.  Opacification left maxillary sinus with air fluid level.  IMPRESSION: Right temporal horn hematoma as detailed above  with small amount of dependent blood within the occipital horn of the ventricles bilaterally.  Prominent small vessel disease type changes without CT evidence of large acute thrombotic infarct.  Critical Value/emergent results were called by telephone at the time of interpretation on 07/01/2012 and at 4:09 p.m. to Dr. Silverio Lay, who verbally acknowledged these results.   Original Report Authenticated By: Fuller Canada, M.D.    Dg Chest Portable 1 View  07/06/2012  *RADIOLOGY REPORT*  Clinical Data: Weakness.Short of breath.  Hypertension.  PORTABLE CHEST - 1 VIEW  Comparison: 07/01/2012.  Findings: Cardiomegaly appears similar to the prior exam.  Dual lead left subclavian cardiac pacemaker with lead position unchanged.  Pulmonary vascular congestion is present.  Interstitial pulmonary edema.  Unchanged blunting of the right costophrenic angle and left pleural thickening.  IMPRESSION: Cardiomegaly with pulmonary vascular congestion and interstitial pulmonary edema.  Findings compatible with mild CHF.   Original Report Authenticated By: Andreas Newport, M.D.  Date: 07/23/2012  Rate: 89  Rhythm: paced rhythm  QRS Axis: left  Intervals: normal  ST/T Wave abnormalities: nonspecific ST/T changes  Conduction Disutrbances:wide QRS  Narrative Interpretation:   Old EKG Reviewed: unchanged    1. Chest pain   2. CHF (congestive heart failure)       MDM  Pt with chest pain and findings of CHF.  Pain improved after nitro, but still 2/10, troponin negative, no EKG changes.  Consulted with Dr. Malachi Bonds with [facility] who will accept pt for transfer to [facility].        I personally performed the services described in this documentation, which was scribed in my presence.  The recorded information has been reviewed and considered.    Bradley Bucco, MD 07/23/12 [phone]

## 2012-07-23 NOTE — ED Notes (Signed)
Took patient's blood sugar result was: 123. Nurse Notified.

## 2012-07-23 NOTE — ED Notes (Signed)
Central midsternal chest pain with and without exertion since yesterday, prior cardiac hx of afib with pacemaker placed. + SOB with onset. Denies N V D LOC

## 2012-07-23 NOTE — ED Notes (Signed)
Pt OOb to restroom. Gait steady, ambulates well.

## 2012-07-23 NOTE — H&P (Signed)
Bradley [name] is an 71 y.o. male.   Patient was seen and examined on July 23, 2012. PCP - Dr. [name]. Cardiologist - Dr. Sharrell Ku. Chief Complaint: Chest pain. HPI:  71 year old male with history of nonischemic cardiomyopathy last EF measured was in November 2012 35-40%, atrial fibrillation status post AV nodal ablation and pacemaker placement, hypertension, diabetes mellitus type 2, hyperlipidemia, history of pleural effusion, who was recently admitted last month for intracranial hemorrhage thought to be secondary to hypotension and was taken off Coumadin and aspirin started experiencing chest pain and shortness of breath yesterday. Patient noticed chest pain yesterday which was retrosternal and increase on exertion and some shortness of breath and got better on taking rest. The chest and recurred again today morning and increase in exertion. It happened couple of times and got better with rest and lasted for 2 minutes. Patient came to the ER at [facility]. Chest x-ray revealed congestion. Cardiac enzyme was negative EKG were showing paced rhythm. Patient was given Lasix 40 mg IV and was transferred to [facility] for further management. Presently patient is chest pain is largely improved but has mild discomfort. Patient is not in acute distress. Denies any nausea vomiting abdominal pain diarrhea. Patient will be admitted for further management. Patient states his headache is largely improved since the bleed last month. He still gets it off and on presently headache free but he did have some headache while he was in the ER. Does not have any focal deficits.  Past Medical History  Diagnosis Date  . HTN (hypertension)   . HLD (hyperlipidemia)   . DM (diabetes mellitus) 1999    adult onset  . BPH (benign prostatic hyperplasia)     Saw Dr [name] 2004, normal renal u/s  . Leg ulcer 2011  . CHF (congestive heart failure)     Thought primarily to be non-systolic although EF down (EF  45-50% 12/2010, down to 35-40% 09/05/11), cath 2008 with no CAD, nuclear study 07/2011 showing Small area of reversibility in the distal ant/lat wall the left ventricle suspicious for ischemia/septal wall HK but felt to be low risk  (per D/C Summary 07/2011)  . Pleural effusion 2008    S/p decortication  . Insomnia   . Atrial fibrillation     s/p AV node ablation & BiV PPM implantation 09/08/11 (op dictation pending)  . Sinoatrial node dysfunction   . Headache   . Pulmonary HTN   . Coronary artery disease   . Anginal pain   . Shortness of breath   . Peripheral vascular disease   . Pneumonia   . Arthritis   . Anemia     intermittent    Past Surgical History  Procedure Date  . Pleural scarification   . Lung decortication   . Colonoscopy 03/10/11    normal  . Pneumothorax with fibrothorax   . Insert / replace / remove pacemaker 09/08/11    pacemaker placement  . Tonsillectomy     Family History  Problem Relation Age of Onset  . Diabetes Father   . Breast cancer Maternal Aunt   . Heart attack Neg Hx   . Prostate cancer Neg Hx   . Colon cancer Neg Hx   . Cancer Neg Hx     colon or prostate  . Coronary artery disease Father   . Polycythemia Mother    Social History:  reports that he quit smoking about 31 years ago. His smoking use included Cigarettes. He  has a 62.5 pack-year smoking history. He has never used smokeless tobacco. He reports that he drinks alcohol. He reports that he does not use illicit drugs.  Allergies:  Allergies  Allergen Reactions  . Avelox (Moxifloxacin Hydrochloride) Itching    Headache  . Tadalafil     headache, backache    Medications Prior to Admission  Medication Sig Dispense Refill  . amLODipine (NORVASC) 10 MG tablet Take 1 tablet (10 mg total) by mouth daily.  30 tablet  1  . carvedilol (COREG) 25 MG tablet Take 1.5 tablets (37.5 mg total) by mouth 2 (two) times daily with a meal.  270 tablet  3  . escitalopram (LEXAPRO) 10 MG tablet Take  1 tablet (10 mg total) by mouth at bedtime.  30 tablet  1  . furosemide (LASIX) 40 MG tablet Take 1.5 tablets (60 mg total) by mouth 2 (two) times daily.  270 tablet  3  . glyBURIDE (DIABETA) 5 MG tablet Take 2 tablets (10 mg total) by mouth 2 (two) times daily with a meal.  360 tablet  3  . losartan (COZAAR) 100 MG tablet Take 1 tablet (100 mg total) by mouth daily.  90 tablet  3  . Multiple Vitamins-Minerals (MULTIVITAMINS THER. W/MINERALS) TABS Take 1 tablet by mouth daily.        . niacin-lovastatin (ADVICOR) 1000-20 MG 24 hr tablet Take 1 tablet by mouth at bedtime.  90 tablet  3  . nitroGLYCERIN (NITROSTAT) 0.4 MG SL tablet Place 1 tablet (0.4 mg total) under the tongue every 5 (five) minutes as needed for chest pain.  90 tablet  0  . oxyCODONE-acetaminophen (ROXICET) 5-325 MG per tablet Take 1 tablet by mouth every 4 (four) hours as needed for pain.  20 tablet  0  . pantoprazole (PROTONIX) 40 MG tablet Take 40 mg by mouth every morning.      . sitaGLIPtin (JANUVIA) 100 MG tablet Take 1 tablet (100 mg total) by mouth daily.  90 tablet  3  . traMADol (ULTRAM) 50 MG tablet Take 1 tablet (50 mg total) by mouth every 6 (six) hours as needed.  30 tablet  0  . zolpidem (AMBIEN) 10 MG tablet Take 1 tablet (10 mg total) by mouth at bedtime as needed. For sleep  90 tablet  1    Results for orders placed during the hospital encounter of 07/23/12 (from the past 48 hour(s))  CBC WITH DIFFERENTIAL     Status: Abnormal   Collection Time   07/23/12  3:45 PM      Component Value Range Comment   WBC 7.2  4.0 - 10.5 K/uL    RBC 4.90  4.22 - 5.81 MIL/uL    Hemoglobin 15.3  13.0 - 17.0 g/dL    HCT 16.1  09.6 - 04.5 %    MCV 85.9  78.0 - 100.0 fL    MCH 31.2  26.0 - 34.0 pg    MCHC 36.3 (*) 30.0 - 36.0 g/dL    RDW 40.9  81.1 - 91.4 %    Platelets 233  150 - 400 K/uL    Neutrophils Relative 69  43 - 77 %    Neutro Abs 4.9  1.7 - 7.7 K/uL    Lymphocytes Relative 20  12 - 46 %    Lymphs Abs 1.4  0.7 -  4.0 K/uL    Monocytes Relative 10  3 - 12 %    Monocytes Absolute 0.7  0.1 - 1.0 K/uL  Eosinophils Relative 1  0 - 5 %    Eosinophils Absolute 0.1  0.0 - 0.7 K/uL    Basophils Relative 0  0 - 1 %    Basophils Absolute 0.0  0.0 - 0.1 K/uL   COMPREHENSIVE METABOLIC PANEL     Status: Abnormal   Collection Time   07/23/12  3:45 PM      Component Value Range Comment   Sodium 138  135 - 145 mEq/L    Potassium 4.0  3.5 - 5.1 mEq/L    Chloride 101  96 - 112 mEq/L    CO2 26  19 - 32 mEq/L    Glucose, Bld 143 (*) 70 - 99 mg/dL    BUN 19  6 - 23 mg/dL    Creatinine, Ser 1.61  0.50 - 1.35 mg/dL    Calcium 9.3  8.4 - 09.6 mg/dL    Total Protein 7.2  6.0 - 8.3 g/dL    Albumin 3.6  3.5 - 5.2 g/dL    AST 17  0 - 37 U/L    ALT 18  0 - 53 U/L    Alkaline Phosphatase 76  39 - 117 U/L    Total Bilirubin 0.6  0.3 - 1.2 mg/dL    GFR calc non Af Amer 84 (*) >90 mL/min    GFR calc Af Amer >90  >90 mL/min   PRO B NATRIURETIC PEPTIDE     Status: Abnormal   Collection Time   07/23/12  3:45 PM      Component Value Range Comment   Pro B Natriuretic peptide (BNP) 566.0 (*) 0 - 125 pg/mL   TROPONIN I     Status: Normal   Collection Time   07/23/12  3:45 PM      Component Value Range Comment   Troponin I <0.30  <0.30 ng/mL   GLUCOSE, CAPILLARY     Status: Abnormal   Collection Time   07/23/12  7:52 PM      Component Value Range Comment   Glucose-Capillary 123 (*) 70 - 99 mg/dL    Dg Chest 2 View  04/54/0981  *RADIOLOGY REPORT*  Clinical Data: Chest pain.  CHEST - 2 VIEW  Comparison: One-view chest 07/06/2012.  Findings: Mild cardiomegaly is again noted.  The mild pulmonary vascular congestion is similar to the prior study.  Chronic pleural thickening is stable.  Pacing wires are in place.  The visualized soft tissues and bony thorax are unremarkable.  IMPRESSION:  1.  Stable cardiomegaly and mild pulmonary vascular congestion. 2.  Stable pleural thickening.   Original Report Authenticated By:  Jamesetta Orleans. MATTERN, M.D.     Review of Systems  Constitutional: Negative.   HENT: Negative.   Eyes: Negative.   Respiratory: Positive for shortness of breath.   Cardiovascular: Positive for chest pain.  Gastrointestinal: Negative.   Genitourinary: Negative.   Musculoskeletal: Negative.   Skin: Negative.   Neurological: Negative.   Endo/Heme/Allergies: Negative.   Psychiatric/Behavioral: Negative.     Blood pressure 135/98, pulse 97, temperature 97.8 F (36.6 C), temperature source Oral, resp. rate 15, height 5\' 9"  (1.753 m), weight 87.6 kg (193 lb 2 oz), SpO2 98.00%. Physical Exam  Constitutional: He is oriented to person, place, and time. He appears well-developed and well-nourished. No distress.  HENT:  Head: Normocephalic and atraumatic.  Right Ear: External ear normal.  Left Ear: External ear normal.  Nose: Nose normal.  Mouth/Throat: Oropharynx is clear and moist. No oropharyngeal exudate.  Eyes: Conjunctivae normal are normal. Pupils are equal, round, and reactive to light. Right eye exhibits no discharge. Left eye exhibits no discharge. No scleral icterus.  Neck: Normal range of motion. Neck supple.  Cardiovascular: Normal rate and regular rhythm.   Respiratory: Effort normal and breath sounds normal. No respiratory distress. He has no wheezes. He has no rales.  GI: Soft. Bowel sounds are normal. He exhibits no distension. There is no tenderness. There is no rebound.  Musculoskeletal: Normal range of motion. He exhibits no edema and no tenderness.  Neurological: He is alert and oriented to person, place, and time.       Moves all extremities.  Skin: He is not diaphoretic.       Chronic skin changes in lower extremities.     Assessment/Plan #1. Chest pain and shortness of breath with history of nonischemic cardiomyopathy with last EF measured in 2012 was 35-40% on home O2 - patient did have cardiac catheter in 2008 and stress test done according to the notes in  October 2012 which showed small areas of reversibility. At this time we will cycle cardiac markers. Patient was given Lasix 40 mg IV one dose and I have placed patient on every 12 dosing with close followup of intake output and metabolic panel. Consult cardiology in a.m. Since patient had recent intracranial bleed patient is not placed on any antiplatelet agents. May  (Patient states he was taking aspirin 81 mg daily since his discharge last).  #2. Atrial fibrillation status post AV node ablation and pacemaker placement - presently rate controlled. Patient is not on Coumadin second recent intracranial bleed. Continue present medications. #3. Hypertension - continue present medications. #4. Diabetes mellitus type 2 - continue present medications with slight scale coverage. #5. Hyperlipidemia - continue present medications. #6. History of tobacco abuse quit 40 years ago.  CODE STATUS - full code.   Eduard Clos 07/23/2012, 10:32 PM

## 2012-07-24 ENCOUNTER — Encounter (HOSPITAL_COMMUNITY): Payer: Self-pay | Admitting: Physician Assistant

## 2012-07-24 ENCOUNTER — Inpatient Hospital Stay (HOSPITAL_COMMUNITY): Payer: Medicare Other

## 2012-07-24 DIAGNOSIS — I4891 Unspecified atrial fibrillation: Secondary | ICD-10-CM

## 2012-07-24 DIAGNOSIS — R079 Chest pain, unspecified: Secondary | ICD-10-CM

## 2012-07-24 DIAGNOSIS — R0682 Tachypnea, not elsewhere classified: Secondary | ICD-10-CM | POA: Diagnosis present

## 2012-07-24 DIAGNOSIS — IMO0002 Reserved for concepts with insufficient information to code with codable children: Secondary | ICD-10-CM

## 2012-07-24 LAB — GLUCOSE, CAPILLARY
Glucose-Capillary: 74 mg/dL (ref 70–99)
Glucose-Capillary: 105 mg/dL — ABNORMAL HIGH (ref 70–99)
Glucose-Capillary: 98 mg/dL (ref 70–99)

## 2012-07-24 LAB — COMPREHENSIVE METABOLIC PANEL
ALT: 20 U/L (ref 0–53)
AST: 18 U/L (ref 0–37)
Albumin: 3.6 g/dL (ref 3.5–5.2)
Alkaline Phosphatase: 75 U/L (ref 39–117)
BUN: 17 mg/dL (ref 6–23)
CO2: 31 mEq/L (ref 19–32)
Calcium: 9.2 mg/dL (ref 8.4–10.5)
Chloride: 101 mEq/L (ref 96–112)
Creatinine, Ser: 0.96 mg/dL (ref 0.50–1.35)
GFR calc Af Amer: >90 mL/min (ref >90)
GFR calc non Af Amer: 81 mL/min — ABNORMAL LOW (ref >90)
Glucose, Bld: 69 mg/dL — ABNORMAL LOW (ref 70–99)
Potassium: 4 mEq/L (ref 3.5–5.1)
Sodium: 141 mEq/L (ref 135–145)
Total Bilirubin: 0.6 mg/dL (ref 0.3–1.2)
Total Protein: 7.1 g/dL (ref 6.0–8.3)

## 2012-07-24 LAB — CBC WITH DIFFERENTIAL/PLATELET
Basophils Absolute: 0 10*3/uL (ref 0.0–0.1)
Basophils Relative: 0 % (ref 0–1)
Eosinophils Absolute: 0.1 10*3/uL (ref 0.0–0.7)
Eosinophils Relative: 1 % (ref 0–5)
HCT: 41.9 % (ref 39.0–52.0)
Hemoglobin: 14.9 g/dL (ref 13.0–17.0)
Lymphocytes Relative: 27 % (ref 12–46)
Lymphs Abs: 2.2 10*3/uL (ref 0.7–4.0)
MCH: 31.2 pg (ref 26.0–34.0)
MCHC: 35.6 g/dL (ref 30.0–36.0)
MCV: 87.7 fL (ref 78.0–100.0)
Monocytes Absolute: 0.9 10*3/uL (ref 0.1–1.0)
Monocytes Relative: 11 % (ref 3–12)
Neutro Abs: 4.9 10*3/uL (ref 1.7–7.7)
Neutrophils Relative %: 60 % (ref 43–77)
Platelets: 206 10*3/uL (ref 150–400)
RBC: 4.78 MIL/uL (ref 4.22–5.81)
RDW: 12.6 % (ref 11.5–15.5)
WBC: 8.1 10*3/uL (ref 4.0–10.5)

## 2012-07-24 LAB — D-DIMER, QUANTITATIVE: D-Dimer, Quant: 0.74 ug/mL-FEU — ABNORMAL HIGH (ref 0.00–0.48)

## 2012-07-24 LAB — TROPONIN I
Troponin I: <0.3 ng/mL (ref <0.30)
Troponin I: <0.3 ng/mL (ref <0.30)

## 2012-07-24 LAB — PRO B NATRIURETIC PEPTIDE: Pro B Natriuretic peptide (BNP): 345.1 pg/mL — ABNORMAL HIGH (ref 0–125)

## 2012-07-24 MED ORDER — MORPHINE SULFATE 2 MG/ML IJ SOLN: 1.0000 mg | INTRAMUSCULAR | Status: DC | PRN

## 2012-07-24 MED ORDER — FUROSEMIDE 40 MG PO TABS
60.0000 mg | ORAL_TABLET | Freq: Two times a day (BID) | ORAL | Status: DC
  Administered 2012-07-24 – 2012-07-26 (×4): 60 mg via ORAL
  Filled 2012-07-24 (×5): qty 1

## 2012-07-24 MED ORDER — OXYCODONE HCL 5 MG PO TABS
5.0000 mg | ORAL_TABLET | ORAL | Status: DC | PRN
  Administered 2012-07-24: 10 mg via ORAL
  Filled 2012-07-24: qty 2

## 2012-07-24 MED ORDER — IOHEXOL 300 MG/ML  SOLN
100.0000 mL | Freq: Once | INTRAMUSCULAR | Status: AC | PRN
  Administered 2012-07-24: 100 mL via INTRAVENOUS

## 2012-07-24 NOTE — Consult Note (Signed)
CARDIOLOGY CONSULT NOTE   Patient ID: Bradley [name] MRN: [id number] DOB/AGE: 1941/01/04 71 y.o.  Admit date: 07/23/2012  Primary Physician   Bradley Ora, MD Primary Cardiologist  Bradley [name]  Reason for Consultation   Chest pain  NWG:NFAO Bradley [name] is a 71 y.o. male with a history of NICM. He was admitted with chest pain and cardiology was asked to evaluate him.  Bradley [name] was hospitalized in September with CHF, since d/c about 3 weeks ago, he has not felt well. He has had consistent DOE and orthopnea. 2 days ago, his SOB worsened, but no LE edema and no weight gain known. He also developed chest pain, a pressure that reached a 7/10 as well as a "pinching" pain and a throbbing pain felt directly over his heart. He was not sleeping well. The symptoms worsened, and yesterday, his family called 911.  EMS gave his SL NTG x 1 that helped briefly, but the pain returned. He did not have radiation of the pain, no diaphoresis or N&V associated with it. Since the pain began, it has been no lower than a 2/10. He was diuresed last pm, but his SOB and his chest pain have persisted. The pain does not change with position, no chest wall tenderness, slightly better with deep inspiration at 1 point, but not now. He also has pain in the back of his neck since on NTG paste but that has been d/c'd. He generally does not feel well.    Past Medical History  Diagnosis Date  . HTN (hypertension)   . HLD (hyperlipidemia)   . DM (diabetes mellitus) 1999    adult onset  . BPH (benign prostatic hyperplasia)     Saw Dr Bradley [name] 2004, normal renal u/s  . Leg ulcer 2011  . CHF (congestive heart failure)     Thought primarily to be non-systolic although EF down (EF 13-08% 12/2010, down to 35-40% 09/05/11), cath 2008 with no CAD, nuclear study 07/2011 showing Small area of reversibility in the distal ant/lat wall the left ventricle suspicious for ischemia/septal wall HK but felt to be low risk  (per D/C Summary 07/2011)  . Pleural  effusion 2008    S/p decortication  . Insomnia   . Atrial fibrillation     s/p AV node ablation & BiV PPM implantation 09/08/11 (op dictation pending)  . Sinoatrial node dysfunction   . Headache   . Pulmonary HTN   . Coronary artery disease   . Anginal pain   . Shortness of breath   . Peripheral vascular disease   . Pneumonia   . Arthritis   . Anemia     intermittent     Past Surgical History  Procedure Date  . Pleural scarification   . Lung decortication   . Colonoscopy 03/10/11    normal  . Pneumothorax with fibrothorax   . Insert / replace / remove pacemaker 09/08/11    pacemaker placement  . Tonsillectomy     Allergies  Allergen Reactions  . Avelox (Moxifloxacin Hydrochloride) Itching    Headache  . Tadalafil     headache, backache    I have reviewed the patient's current medications    . carvedilol  37.5 mg Oral BID WC  . escitalopram  10 mg Oral QHS  . furosemide  40 mg Intravenous Once  . furosemide  60 mg Oral BID  . glyBURIDE  10 mg Oral BID WC  . insulin aspart  0-9 Units Subcutaneous TID WC  .  linagliptin  5 mg Oral Daily  . losartan  100 mg Oral Daily  . multivitamin with minerals  1 tablet Oral Daily  . niacin  1,000 mg Oral QHS  . pantoprazole  40 mg Oral q morning - 10a  . simvastatin  10 mg Oral q1800  . sodium chloride  3 mL Intravenous Q12H  . DISCONTD: amLODipine  10 mg Oral Daily  . DISCONTD: furosemide  40 mg Intravenous Q12H  . DISCONTD: niacin-lovastatin  1 tablet Oral QHS  . DISCONTD: nitroGLYCERIN  0.4 mg Transdermal Daily  . DISCONTD: sodium chloride  3 mL Intravenous Q12H     acetaminophen, acetaminophen, morphine injection, ondansetron (ZOFRAN) IV, ondansetron, oxyCODONE, traMADol, zolpidem, DISCONTD: nitroGLYCERIN, DISCONTD: oxyCODONE-acetaminophen  Prior to Admission medications   Medication Sig Start Date End Date Taking? Authorizing Provider  amLODipine (NORVASC) 10 MG tablet Take 1 tablet (10 mg total) by mouth daily.  07/10/12  Yes Laveda Norman, MD  carvedilol (COREG) 25 MG tablet Take 1.5 tablets (37.5 mg total) by mouth 2 (two) times daily with a meal. 05/08/12 05/08/13 Yes Bradley Plump, MD  escitalopram (LEXAPRO) 10 MG tablet Take 1 tablet (10 mg total) by mouth at bedtime. 07/10/12  Yes Laveda Norman, MD  furosemide (LASIX) 40 MG tablet Take 1.5 tablets (60 mg total) by mouth 2 (two) times daily. 05/08/12  Yes Bradley Plump, MD  glyBURIDE (DIABETA) 5 MG tablet Take 2 tablets (10 mg total) by mouth 2 (two) times daily with a meal. 05/08/12  Yes Bradley Plump, MD  losartan (COZAAR) 100 MG tablet Take 1 tablet (100 mg total) by mouth daily. 05/08/12  Yes Bradley Plump, MD  Multiple Vitamins-Minerals (MULTIVITAMINS THER. W/MINERALS) TABS Take 1 tablet by mouth daily.     Yes Historical Provider, MD  niacin-lovastatin (ADVICOR) 1000-20 MG 24 hr tablet Take 1 tablet by mouth at bedtime. 05/08/12  Yes Bradley Plump, MD  nitroGLYCERIN (NITROSTAT) 0.4 MG SL tablet Place 1 tablet (0.4 mg total) under the tongue every 5 (five) minutes as needed for chest pain. 07/10/12  Yes Laveda Norman, MD  oxyCODONE-acetaminophen (ROXICET) 5-325 MG per tablet Take 1 tablet by mouth every 4 (four) hours as needed for pain. 07/19/12  Yes Bradley Plump, MD  pantoprazole (PROTONIX) 40 MG tablet Take 40 mg by mouth every morning.   Yes Historical Provider, MD  sitaGLIPtin (JANUVIA) 100 MG tablet Take 1 tablet (100 mg total) by mouth daily. 05/08/12  Yes Bradley Plump, MD  traMADol (ULTRAM) 50 MG tablet Take 1 tablet (50 mg total) by mouth every 6 (six) hours as needed. 07/10/12  Yes Laveda Norman, MD  zolpidem (AMBIEN) 10 MG tablet Take 1 tablet (10 mg total) by mouth at bedtime as needed. For sleep 05/08/12  Yes Bradley Plump, MD     History   Social History  . Marital Status: Married    Spouse Name: Bradley [name]    Number of Children: 1  . 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 Smoking status: Former  Smoker -- 2.5 packs/day for 25 years    Types: Cigarettes    Quit date: 10/11/1980  . Smokeless tobacco: Never Used  . Alcohol Use: Yes     occassionally once a year  . Drug Use: No  . Sexually Active: No   Other Topics Concern  . Not on file  Social History Narrative   Moved from [place] 2003.Marland KitchenHe lives w/ his wife      Family History  Problem Relation Age of Onset  . Diabetes Father   . Breast cancer Maternal Aunt   . Heart attack Neg Hx   . Prostate cancer Neg Hx   . Colon cancer Neg Hx   . Cancer Neg Hx     colon or prostate  . Coronary artery disease Father   . Polycythemia Mother      ROS:  Full 14 point review of systems complete and found to be negative unless listed above.  Physical Exam: Blood pressure 120/79, pulse 73, temperature 97.2 F (36.2 C), temperature source Oral, resp. rate 22, height 5\' 9"  (1.753 m), weight 209 lb 14.1 oz (95.2 kg), SpO2 100.00%.  General: Well developed, well nourished, male in no acute distress Head: Eyes PERRLA, No xanthomas.   Normocephalic and atraumatic, oropharynx without edema or exudate. Dentition: good Lungs: bilateral rales, no crackles or wheeze Heart: HRRR S1 S2, no rub/gallop, no murmur. pulses are 2+ all 4 extrem.  Neck: No carotid bruits. No lymphadenopathy.  JVD at 8 cm. Abdomen: Bowel sounds present, abdomen soft and non-tender without masses or hernias noted. Msk:  No spine or cva tenderness. No weakness, no joint deformities or effusions. Extremities: No clubbing or cyanosis. No edema.  Neuro: Alert and oriented X 3. No focal deficits noted. Psych:  Good affect, responds appropriately Skin: No rashes or lesions noted.  Labs:   Lab Results  Component Value Date   WBC 8.1 07/24/2012   HGB 14.9 07/24/2012   HCT 41.9 07/24/2012   MCV 87.7 07/24/2012   PLT 206 07/24/2012      Lab 07/24/12 0540  NA 141  K 4.0  CL 101  CO2 31  BUN 17  CREATININE 0.96  CALCIUM 9.2  PROT 7.1  BILITOT 0.6  ALKPHOS 75  ALT  20  AST 18  GLUCOSE 69*    Basename 07/24/12 1100 07/24/12 0540 07/23/12 2228 07/23/12 1545  CKTOTAL -- -- -- --  CKMB -- -- -- --  TROPONINI <0.30 <0.30 <0.30 <0.30    Pro B Natriuretic peptide (BNP)  Date/Time Value Range Status  07/24/2012  5:40 AM 345.1* 0 - 125 pg/mL Final  07/23/2012  3:45 PM 566.0* 0 - 125 pg/mL Final   Echo: 07/02/2012 Study Conclusions - Left ventricle: The cavity size was normal. There was moderate concentric hypertrophy. Systolic function was mildly reduced. The estimated ejection fraction was in the range of 45% to 50%. Wall motion was normal; there were no regional wall motion abnormalities. - Mitral valve: Mildly calcified annulus. Mild to moderate regurgitation. - Left atrium: The atrium was moderately dilated. - Right ventricle: The cavity size was dilated. Wall thickness was normal. - Pulmonary arteries: Systolic pressure was mildly to moderately increased. PA peak pressure: 48mm Hg (S). Impressions: - No cardiac source of emboli was indentified.  ECG: 23-Jul-2012 15:14:37   VENTRICULAR PACED RHYTHM When compared with ECG of 07/08/2012, No significant change was found 10mm/s 66mm/mV 150Hz  8.0.1 12SL 241 CID: 0 Referred by: Confirmed By: Dione Booze MD Vent. rate 89 BPM PR interval * ms QRS duration 136 ms QT/QTc 404/491 ms P-R-T axes * 263 83   Radiology:  Dg Chest 2 View 07/23/2012  *RADIOLOGY REPORT*  Clinical Data: Chest pain.  CHEST - 2 VIEW  Comparison: One-view chest 07/06/2012.  Findings: Mild cardiomegaly is again noted.  The mild pulmonary vascular congestion  is similar to the prior study.  Chronic pleural thickening is stable.  Pacing wires are in place.  The visualized soft tissues and bony thorax are unremarkable.  IMPRESSION:  1.  Stable cardiomegaly and mild pulmonary vascular congestion. 2.  Stable pleural thickening.   Original Report Authenticated By: Jamesetta Orleans. MATTERN, M.D.      ASSESSMENT AND PLAN:   The  patient was seen today by Dr Eden Emms, the patient evaluated and the data reviewed.  Principal Problem:  *Chest pain - atypical and enzymes are negative. No clear cause by interviewing patient. He does not have significant volume overload by exam, but continues to have SOB and does not feel well. Will ck CT chest and ask Dr Thurston Hole team to see him. Can do MV later, decide on in-pt vs OP testing.  Otherwise, per primary MD. Active Problems:  HYPERTENSION  Diabetes mellitus  CHF (congestive heart failure)  Tachypnea   Signed: Theodore Demark 07/24/2012, 4:26 PM Co-Sign MD   Patient examined chart reviewed.  No history of CAD.  ECG is paced.  CT 12/12 with abnormal lymph nodes and reticular nodular pattern.  BNP on ly mildly elevated with good diuresis.  Exam remarkable for pursed lip breathing wheezing on right with previous LUL decortication scar.  Needs f/u CT scan an pulmonary consult.  Has moderate restrictive disease and moderate decrease in DLCO on PFT;s done 2012  Pacer working normally  Not sure any stress testing indicated at this point  [employer]

## 2012-07-24 NOTE — Progress Notes (Signed)
Patient was offered [facility] Care Management services prior to this admission.  He declined and was provided contact information.  For additional questions or referrals please contact Anibal Henderson BSN RN [facility] Liaison at [phone].

## 2012-07-24 NOTE — Progress Notes (Signed)
Nutrition Brief Note  Patient identified on the Malnutrition Screening Tool (MST) Report.  Body mass index is 30.99 kg/(m^2). Pt meets criteria for Obese class 1 based on current BMI.   Current diet order is CHO Modified Medium, patient is consuming approximately 100% of meals at this time. Labs and medications reviewed.   Pt states that he has intentionally lost wt through dieting.  Pt with approximately 7% wt change in >1 yr which is not clinically significant.  Pt currently eating well with good appetite. No nutrition interventions warranted at this time. If nutrition issues arise, please consult RD.   Loyce Dys, MS RD LDN Clinical Inpatient Dietitian Pager: [phone] Weekend/After hours pager: [phone]

## 2012-07-24 NOTE — Progress Notes (Addendum)
[facility] Progress Note [place] TEAM 1 - Stepdown/ICU TEAM   Bradley [name] WUJ:[id number] DOB: May 18, 1941 DOA: 07/23/2012 PCP: [name], MD  Brief narrative: 71 year old male with history of nonischemic cardiomyopathy (last EF measured was in September 2013 45-50%), atrial fibrillation status post AV nodal ablation and pacemaker placement, hypertension, diabetes mellitus type 2, hyperlipidemia, history of pleural effusion, who was recently admitted last month for intracranial hemorrhage thought to be secondary to HTN (was taken off Coumadin and aspirin) who started experiencing chest pain and shortness of breath. Patient noticed chest pain which was retrosternal and increased on exertion and some shortness of breath and got better on taking rest.  Patient came to the ER at [facility]. Chest x-ray revealed congestion. Cardiac enzymes negative EKG showing paced rhythm. Patient was given Lasix 40 mg IV and was transferred to [facility] for further management.  Denied any nausea vomiting abdominal pain diarrhea.  Patient stated his headache is largely improved since the bleed last month. He still gets it off and on. Does not have any focal deficits.  Patient was admitted to the hospital by 07-01-2012 to 07/04/2012, found to have an intracranial bleed, he was discharged home, since his discharge he was confused and symptoms were getting worse.  He was readmitted 07/06/2012 until 07/10/2012 due to mental status changes, a CT of the head was stable, he was initially febrile but the septic workup was negative.  He was noted to be under a lot of stress due to to family issues and was started on Lexapro.  Of note, he is no longer a Coumadin candidate.   Assessment/Plan:  Chest pain with dyspnea Troponins negative x4 - EKG is not helpful (paced) - CXR w/o evidence of significant pulmonary edema - cath 2008 with no CAD - nuclear study 07/2011 showing small area of reversibility in the  distal ant/lat wall of the left ventricle suspicious for ischemia but felt to be low risk per Cardiology at that time - pain persists today per pt - given his frequent recent hospital stays, without benefit of anticoag due to ICH, I feel that PE must be high on our differential - I will begin the evaluation with a d-Dimer - if +, I will then precede with CTA of chest - I will not anticoag at this time due to his recent hx of ICH, but if he is found to be suffering with PE, we will need to discuss w/ Neuro if anticoag can be initiated at this time (given timing, I suspect heparin would be acceptable if clearly indicated)  ? Chronic lung disease / pulmonary fibrosis? / advanced COPD? Pulmonary to see as inpatient - has been evaluated in Pulm clinic by [name] in past  Nonischemic cardiomyophathy - EF 45-50% September 2013 Followed by Sharrell Ku, MD - pt requests to be seen by Dr. Ladona Ridgel during his stay - I have called a consult - no clinical evidence to suggest signif acute CHF exac  Adenopathy of chest  Noted on CT of chest in December 2012 - per review of Dr. Leta Jungling office notes, "Has an abnormal CT of the chest, several lymphadenopathies, the patient stated that he already discussed that with 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  It is difficult to pin him down but he describes it at times a sharp stabbing pain and at other times as a pressure sensation.  He also admits to intermittent lower extremity swelling.  He denies nausea vomiting fevers chills or persistent headache at this time.   Objective: Blood pressure 117/61, pulse 70, temperature 97.2 F (36.2 C), temperature source Oral, resp. rate 19, height 5\' 9"  (1.753 m), weight 95.2 kg (209 lb 14.1 oz), SpO2 97.00%.  Intake/Output Summary (Last 24 hours) at 07/24/12 1510 Last data filed at 07/24/12 1300  Gross per 24 hour  Intake    344 ml  Output   1375 ml  Net  -1031 ml     Exam: General: tachypneic - some purse lipped breathing - no severe distress Lungs: Clear to auscultation bilaterally without wheezes or crackles Cardiovascular: Regular rate and rhythm without murmur gallop or rub normal S1 and S2 Abdomen: Nontender, nondistended, soft, bowel sounds positive, no rebound, no ascites, no appreciable mass - modestly overweight  Extremities: No significant cyanosis, clubbing, or edema bilateral lower extremities - hemosiderin staining of B LE   Data Reviewed: Basic Metabolic Panel:  Lab 07/24/12 1191 07/23/12 1545  NA 141 138  K 4.0 4.0  CL 101 101  CO2 31 26  GLUCOSE 69* 143*  BUN 17 19  CREATININE 0.96 0.90  CALCIUM 9.2 9.3  MG -- --  PHOS -- --   Liver Function Tests:  Lab 07/24/12 0540 07/23/12 1545  AST 18 17  ALT 20 18  ALKPHOS 75 76  BILITOT 0.6 0.6  PROT 7.1 7.2  ALBUMIN 3.6 3.6   CBC:  Lab 07/24/12 0540 07/23/12 1545  WBC 8.1 7.2  NEUTROABS 4.9 4.9  HGB 14.9 15.3  HCT 41.9 42.1  MCV 87.7 85.9  PLT 206 233   Cardiac Enzymes:  Lab 07/24/12 1100 07/24/12 0540 07/23/12 2228 07/23/12 1545  CKTOTAL -- -- -- --  CKMB -- -- -- --  CKMBINDEX -- -- -- --  TROPONINI <0.30 <0.30 <0.30 <0.30   BNP (last 3 results)  Basename 07/24/12 0540 07/23/12 1545 07/01/12 1445  PROBNP  345.1* 566.0* 1377.0*   CBG:  Lab 07/24/12 1305 07/23/12 2230 07/23/12 1952  GLUCAP 105* 74 123*    Recent Results (from the past 240 hour(s))  MRSA PCR SCREENING     Status: Normal   Collection Time   07/23/12  9:07 PM      Component Value Range Status Comment   MRSA by PCR NEGATIVE  NEGATIVE Final      Studies:  Recent x-ray studies have been reviewed in detail by the Attending Physician  Scheduled Meds:  Reviewed in detail by the Attending Physician   Lonia Blood, MD [facility] Office  [phone] Pager [phone]  On-Call/Text Page:      [email or web address]      password [email or web address]  If 7PM-7AM, please contact night-coverage [email or web address] Password [email or web address] 07/24/2012, 3:10 PM   LOS: 1 day

## 2012-07-25 ENCOUNTER — Ambulatory Visit: Payer: Medicare Other | Admitting: Internal Medicine

## 2012-07-25 DIAGNOSIS — R918 Other nonspecific abnormal finding of lung field: Secondary | ICD-10-CM

## 2012-07-25 DIAGNOSIS — R4182 Altered mental status, unspecified: Secondary | ICD-10-CM

## 2012-07-25 DIAGNOSIS — R079 Chest pain, unspecified: Secondary | ICD-10-CM

## 2012-07-25 DIAGNOSIS — Z0000 Encounter for general adult medical examination without abnormal findings: Secondary | ICD-10-CM

## 2012-07-25 DIAGNOSIS — R599 Enlarged lymph nodes, unspecified: Secondary | ICD-10-CM

## 2012-07-25 LAB — BASIC METABOLIC PANEL
BUN: 21 mg/dL (ref 6–23)
CO2: 27 mEq/L (ref 19–32)
Calcium: 9 mg/dL (ref 8.4–10.5)
Chloride: 101 mEq/L (ref 96–112)
Creatinine, Ser: 0.86 mg/dL (ref 0.50–1.35)
GFR calc Af Amer: >90 mL/min (ref >90)
GFR calc non Af Amer: 85 mL/min — ABNORMAL LOW (ref >90)
Glucose, Bld: 56 mg/dL — ABNORMAL LOW (ref 70–99)
Potassium: 3.8 mEq/L (ref 3.5–5.1)
Sodium: 137 mEq/L (ref 135–145)

## 2012-07-25 LAB — GLUCOSE, CAPILLARY
Glucose-Capillary: 96 mg/dL (ref 70–99)
Glucose-Capillary: 68 mg/dL — ABNORMAL LOW (ref 70–99)
Glucose-Capillary: 127 mg/dL — ABNORMAL HIGH (ref 70–99)
Glucose-Capillary: 120 mg/dL — ABNORMAL HIGH (ref 70–99)
Glucose-Capillary: 121 mg/dL — ABNORMAL HIGH (ref 70–99)
Glucose-Capillary: 171 mg/dL — ABNORMAL HIGH (ref 70–99)

## 2012-07-25 MED ORDER — ADULT MULTIVITAMIN W/MINERALS CH
1.0000 | ORAL_TABLET | Freq: Every day | ORAL | Status: DC
  Administered 2012-07-25 – 2012-07-26 (×2): 1 via ORAL
  Filled 2012-07-25 (×2): qty 1

## 2012-07-25 NOTE — Progress Notes (Signed)
Inpatient Diabetes Program Recommendations  AACE/ADA: New Consensus Statement on Inpatient Glycemic Control (2013)  Target Ranges:  Prepandial:   less than 140 mg/dL      Peak postprandial:   less than 180 mg/dL (1-2 hours)      Critically ill patients:  140 - 180 mg/dL   Results for DEVONDRE, GUZZETTA (MRN [id number]) as of 07/25/2012 13:57  Ref. Range 07/23/2012 22:30 07/24/2012 13:05 07/24/2012 16:49  Glucose-Capillary Latest Range: 70-99 mg/dL 74 086 (H) 98    Results for GALO, SAYED (MRN [id number]) as of 07/25/2012 13:57  Ref. Range 07/25/2012 08:18 07/25/2012 08:50 07/25/2012 12:14  Glucose-Capillary Latest Range: 70-99 mg/dL 68 (L) 528 (H) 413 (H)    Inpatient Diabetes Program Recommendations Oral Agents: Please consider holding Glyburide while in the hospital or may even consider decreasing dose by half due to hypoglycemic episodes and A1C is 5.9%.  Note: Will continue to follow. Orlando Penner, RN, BSN, CCRN Diabetes Coordinator

## 2012-07-25 NOTE — Progress Notes (Signed)
Patient ID: Bradley [name], male   DOB: 10/19/1940, 71 y.o.   MRN: [id number]  [facility] PROGRESS NOTE  Bradley [name] WUX:[id number] DOB: 1940-11-19 DOA: 07/23/2012 PCP: [name], MD  Brief narrative: Brief narrative:  Pt is 71 year old male with nonischemic cardiomyopathy (EF 06/2012 45-50%), atrial fibrillation, s/p AV nodal ablation and pacemaker placement, HTN, DM 2, HLD, recently admitted 07/01/2012 for intracranial hemorrhage thought to be secondary to HTN and was subsequently was taken off Coumadin and aspirin. He presented to [facility] ED with worsening, intermittent, retrosternal chest pain associated with dyspnea on exertion.  Assessment/Plan:  Chest pain with dyspnea   Unclear etiology to date and per cardiology not of cardiac source  No events on telemetry over 24 hours  CE x 4 negative  PCCM consulted  ? Chronic lung disease / pulmonary fibrosis? / advanced COPD?   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  Stable cardiomegaly and mild pulmonary vascular congestion.  2.  Stable pleural thickening.    Ct Angio Chest Pe W/cm &/or Wo Cm 07/24/2012    IMPRESSION:  1.  Mildly motion degraded exam.  Given this factor, no evidence of pulmonary embolism.   2.  Improved to resolved thoracic adenopathy.  This is likely reactive or related to congestive heart failure.   3.  Improved aeration with resolution of mosaic  attenuation.   4.  Resolved right pleural effusion.  Trace lateral left pleural fluid/thickening remains.    Antibiotics and microbiology studies:  NO ANTIBIOTICS REQUIRED  MRSA 07/23/2102 --> Negative  Code Status: FULL CODE Family Communication: Pt at bedside Disposition Plan: Will transfer to telemetry   Manson Passey, MD  [facility] Pager [phone]  If 7PM-7AM, please contact night-coverage [email or web address] Password [email or web address] 07/25/2012, 11:19 AM   LOS: 2 days   HPI/Subjective: No events overnight. Pt reports ongoing left sided chest pain  Objective: Filed Vitals:   07/25/12 0000 07/25/12 0322 07/25/12 0500 07/25/12 0800  BP: 127/73 120/78  118/70  Pulse: 69 72  70  Temp: 97 F (36.1 C) 97.8 F (36.6 C)  98 F (36.7 C)  TempSrc: Oral Oral  Oral  Resp: 21 24  15   Height:      Weight:   92.035 kg (202 lb 14.4 oz)   SpO2: 99% 99%  94%    Intake/Output Summary (Last 24 hours) at 07/25/12 1119 Last data filed at 07/25/12 0800  Gross per 24 hour  Intake    443 ml  Output   1100 ml  Net   -657 ml    Exam:   General:  Pt is alert, follows commands appropriately, not in acute distress  Cardiovascular: Irregular rate and rhythm, no murmurs, no rubs, no gallops  Respiratory: Clear to auscultation bilaterally, no wheezing, no crackles, no rhonchi  Abdomen: Soft, non tender, non distended, bowel sounds present, no  guarding  Extremities: No edema, pulses DP and PT palpable bilaterally  Neuro: Grossly nonfocal  Data Reviewed: Basic Metabolic Panel:  Lab 07/25/12 1610 07/24/12 0540 07/23/12 1545  NA 137 141 138  K 3.8 4.0 4.0  CL 101 101 101  CO2 27 31 26   GLUCOSE 56* 69* 143*  BUN 21 17 19   CREATININE 0.86 0.96 0.90  CALCIUM 9.0 9.2 9.3  MG -- -- --  PHOS -- -- --   Liver Function Tests:  Lab 07/24/12 0540 07/23/12 1545  AST 18 17  ALT 20 18  ALKPHOS 75 76  BILITOT 0.6 0.6  PROT 7.1 7.2  ALBUMIN 3.6 3.6   CBC:  Lab 07/24/12 0540 07/23/12  1545  WBC 8.1 7.2  NEUTROABS 4.9 4.9  HGB 14.9 15.3  HCT 41.9 42.1  MCV 87.7 85.9  PLT 206 233   Cardiac Enzymes:  Lab 07/24/12 1100 07/24/12 0540 07/23/12 2228 07/23/12 1545  CKTOTAL -- -- -- --  CKMB -- -- -- --  CKMBINDEX -- -- -- --  TROPONINI <0.30 <0.30 <0.30 <0.30   CBG:  Lab 07/25/12 0850 07/25/12 0818 07/24/12 1649 07/24/12 1305 07/23/12 2230  GLUCAP 127* 68* 98 105* 74   Scheduled Meds:   . carvedilol  37.5 mg Oral BID WC  . escitalopram  10 mg Oral QHS  . furosemide  60 mg Oral BID  . glyBURIDE  10 mg Oral BID WC  . insulin aspart  0-9 Units Subcutaneous TID WC  . linagliptin  5 mg Oral Daily  . losartan  100 mg Oral Daily  . multivitamin  1 tablet Oral Daily  . niacin  1,000 mg Oral QHS  . pantoprazole  40 mg Oral q morning - 10a  . simvastatin  10 mg Oral q1800

## 2012-07-25 NOTE — Progress Notes (Signed)
Hypoglycemic Event  CBG: 68  Treatment: 15 GM carbohydrate snack  Symptoms: None  Follow-up CBG: [id number] CBG Result:127  Possible Reasons for Event: Inadequate meal intake  Comments/MD notified:Devine    Bradley [name]  Remember to initiate Hypoglycemia Order Set & complete

## 2012-07-25 NOTE — Progress Notes (Signed)
Report called to Rodney Booze, Charity fundraiser. Pt to transfer to [id number] via wheelchair, no IV, no O2. VS stable upon transfer. Family and belongings at bedside. Meds sent to unit. No current questions or complaints. Aysia Lowder L

## 2012-07-25 NOTE — Progress Notes (Signed)
Patient ID: Bradley [name], male   DOB: [birth year], 71 y.o.   MRN: [id number] Subjective:  C/o ongoing pleuritic chest pain. No increased sob.  Objective:  Vital Signs in the last 24 hours: Temp:  [97 F (36.1 C)-98 F (36.7 C)] 98 F (36.7 C) (10/15 0800) Pulse Rate:  [69-78] 70  (10/15 0800) Resp:  [13-29] 15  (10/15 0800) BP: (109-141)/(49-79) 118/70 mmHg (10/15 0800) SpO2:  [94 %-100 %] 94 % (10/15 0800) Weight:  [202 lb 14.4 oz (92.035 kg)] 202 lb 14.4 oz (92.035 kg) (10/15 0500)  Intake/Output from previous day: 10/14 0701 - 10/15 0700 In: 663 [P.O.:660; I.V.:3] Out: 1150 [Urine:1150] Intake/Output from this shift: Total I/O In: -  Out: 250 [Urine:250]  Physical Exam: Well appearing NAD HEENT: Unremarkable Neck:  No JVD, no thyromegally Lungs:  Clear with no wheezes, rales, or rhonchi, no rub HEART:  Regular rate rhythm, no murmurs, no rubs, no clicks Abd:  soft, positive bowel sounds, no organomegally, no rebound, no guarding Ext:  2 plus pulses, no edema, no cyanosis, no clubbing Skin:  No rashes no nodules Neuro:  CN II through XII intact, motor grossly intact  Lab Results:  Basename 07/24/12 0540 07/23/12 1545  WBC 8.1 7.2  HGB 14.9 15.3  PLT 206 233    Basename 07/25/12 0430 07/24/12 0540  NA 137 141  K 3.8 4.0  CL 101 101  CO2 27 31  GLUCOSE 56* 69*  BUN 21 17  CREATININE 0.86 0.96    Basename 07/24/12 1100 07/24/12 0540  TROPONINI <0.30 <0.30   Hepatic Function Panel  Basename 07/24/12 0540  PROT 7.1  ALBUMIN 3.6  AST 18  ALT 20  ALKPHOS 75  BILITOT 0.6  BILIDIR --  IBILI --   No results found for this basename: CHOL in the last 72 hours No results found for this basename: PROTIME in the last 72 hours  Imaging: Dg Chest 2 View  07/23/2012  *RADIOLOGY REPORT*  Clinical Data: Chest pain.  CHEST - 2 VIEW  Comparison: One-view chest 07/06/2012.  Findings: Mild cardiomegaly is again noted.  The mild pulmonary vascular congestion is  similar to the prior study.  Chronic pleural thickening is stable.  Pacing wires are in place.  The visualized soft tissues and bony thorax are unremarkable.  IMPRESSION:  1.  Stable cardiomegaly and mild pulmonary vascular congestion. 2.  Stable pleural thickening.   Original Report Authenticated By: Jamesetta Orleans. MATTERN, M.D.    Ct Angio Chest Pe W/cm &/or Wo Cm  07/24/2012  *RADIOLOGY REPORT*  Clinical Data: Shortness of breath.  Follow up of abnormal chest CT.  Lymphadenopathy.  CT ANGIOGRAPHY CHEST  Technique:  Multidetector CT imaging of the chest using the standard protocol during bolus administration of intravenous contrast. Multiplanar reconstructed images including MIPs were obtained and reviewed to evaluate the vascular anatomy.  Contrast: 80 ml of Omnipaque 350  Comparison: plain film of 08/10/2012.  Most recent CT of 09/20/2011.  Findings: Lung windows demonstrate mild motion degradation inferiorly.  Nonspecific bronchial wall thickening to the lower lobes, greater left than right.  This is improved.  Improvement aeration, with resolution of previous described mosaic attenuation.  Minimal nonspecific interstitial thickening at the left lung base peripherally. A probable subpleural lymph node along the left major fissure on image 43 measuring 3 mm.  Soft tissue windows:  The quality of this exam for evaluation of pulmonary embolism is good.  Apparent hypoenhancement within a left lower lobe pulmonary  artery branch on image 149 of series 6 is favored to be due to mild motion artifact.  Otherwise, no evidence of pulmonary embolism.  No supraclavicular adenopathy.  Normal aortic caliber without dissection.  Moderate cardiomegaly, with coronary artery atherosclerosis.  Trace left-sided pleural fluid/thickening laterally.  Resolved right-sided pleural fluid.  Improvement in thoracic adenopathy.  A right paratracheal node measures 1.0 cm on image 30 versus 1.9 cm on the prior. Azygo-esophageal recess  node measures 1.6 cm today versus 2.3 cm on the prior.  No hilar adenopathy.  Decreased prevascular nodal size.  Limited abdominal imaging demonstrates no significant findings.  No acute osseous abnormality.  IMPRESSION: 1.  Mildly motion degraded exam.  Given this factor, no evidence of pulmonary embolism.  2.  Improved to resolved thoracic adenopathy.  This is likely reactive or related to congestive heart failure.  3.  Improved aeration with resolution of mosaic attenuation.  4.  Resolved right pleural effusion.  Trace lateral left pleural fluid/thickening remains.   Original Report Authenticated By: Consuello Bossier, M.D.     Cardiac Studies: Tele - atrial fib with ventricular pacing Assessment/Plan:  1. Chest pain - this is non-cardiac. I have reviewed the serial CT scans with Dr. Sharol Harness of our pulmonary dept. He has pleural thickening which has changed around the site that he is experiencing chest pain. The etiology is unclear. ?asbestosis. Await formal pulmonary consult. Does he need pleural biopsy? 2. Chronic diastolic/systolic CHF - fairly well compensated at this point. Will continue his current meds.  LOS: 2 days    Johann Gascoigne,,M.D. 07/25/2012, 9:40 AM

## 2012-07-25 NOTE — Consult Note (Signed)
Name: Bradley [name] MRN: [id number] DOB: September 22, 1941    LOS: 2  Referring Provider:  Ladona Ridgel [name] for Referral:  Pleural thickening Pulm [name], last seen 1/13  PULMONARY / CRITICAL CARE MEDICINE  HPI:  89 yowm , retired [employer], quit smoking in 4098 prev pulm eval in 2008 with pleural effusions but all better with no resp issues until Aug 2012 when dx with bronchitis and "downhill since" so referred by Dr Drue Novel back to pulmonary clinic in Nov 2012 and ultimately proven to have no sign copd by pft's 10/28/2011  started on 02 2lpm at discharge 09/23/11 for CHF exacerbation. - PFT's 10/28/2011 FEV1 1.40 (51%) with ratio 70 and no better p B2 and DLCO 53 corrects to 101  Last seen 1/13 -was tolerating coreg Of note he has h/o RT decortication for pleural peel - unclear cause ? Non traumatic hemothorax vs parapneumonic, pt is vague on details & no old records to review. He is readmitted with sub sternal discomfort , felt to be non cardiac & we are asked to comment on pleural thickening noted on CT. CHF appears compensated CT angio 10/13 when compared to dec '12 showed no evidence of pulmonary embolism.  Improved to resolved thoracic adenopathy.  Improved aeration with resolution of mosaic attenuation. Resolved right pleural effusion. Trace lateral left pleural fluid/thickening remained.  He reports dyspnea on exertion but denies pND, orthopnea Echo showed EF 45-50%, RVSP 48 mm     Past Medical History  Diagnosis Date  . HTN (hypertension)   . HLD (hyperlipidemia)   . DM (diabetes mellitus) 1999    adult onset  . BPH (benign prostatic hyperplasia)     Saw Dr [name] 2004, normal renal u/s  . Leg ulcer 2011  . CHF (congestive heart failure)     Thought primarily to be non-systolic although EF down (EF 11-91% 12/2010, down to 35-40% 09/05/11), cath 2008 with no CAD, nuclear study 07/2011 showing Small area of reversibility in the distal ant/lat wall the left ventricle suspicious for  ischemia/septal wall HK but felt to be low risk  (per D/C Summary 07/2011)  . Pleural effusion 2008    S/p decortication  . Insomnia   . Atrial fibrillation     s/p AV node ablation & BiV PPM implantation 09/08/11 (op dictation pending)  . Sinoatrial node dysfunction   . Headache   . Pulmonary HTN   . Coronary artery disease   . Anginal pain   . Shortness of breath   . Peripheral vascular disease   . Pneumonia   . Arthritis   . Anemia     intermittent   Past Surgical History  Procedure Date  . Pleural scarification   . Lung decortication   . Colonoscopy 03/10/11    normal  . Pneumothorax with fibrothorax   . Insert / replace / remove pacemaker 09/08/11    pacemaker placement  . Tonsillectomy    Prior to Admission medications   Medication Sig Start Date End Date Taking? Authorizing Provider  amLODipine (NORVASC) 10 MG tablet Take 1 tablet (10 mg total) by mouth daily. 07/10/12  Yes Laveda Norman, MD  carvedilol (COREG) 25 MG tablet Take 1.5 tablets (37.5 mg total) by mouth 2 (two) times daily with a meal. 05/08/12 05/08/13 Yes [name], MD  escitalopram (LEXAPRO) 10 MG tablet Take 1 tablet (10 mg total) by mouth at bedtime. 07/10/12  Yes Laveda Norman, MD  furosemide (LASIX) 40 MG tablet Take 1.5 tablets (  60 mg total) by mouth 2 (two) times daily. 05/08/12  Yes [name], MD  glyBURIDE (DIABETA) 5 MG tablet Take 2 tablets (10 mg total) by mouth 2 (two) times daily with a meal. 05/08/12  Yes [name], MD  losartan (COZAAR) 100 MG tablet Take 1 tablet (100 mg total) by mouth daily. 05/08/12  Yes [name], MD  Multiple Vitamins-Minerals (MULTIVITAMINS THER. W/MINERALS) TABS Take 1 tablet by mouth daily.     Yes Historical Provider, MD  niacin-lovastatin (ADVICOR) 1000-20 MG 24 hr tablet Take 1 tablet by mouth at bedtime. 05/08/12  Yes [name], MD  nitroGLYCERIN (NITROSTAT) 0.4 MG SL tablet Place 1 tablet (0.4 mg total) under the tongue every 5 (five) minutes as needed for chest pain.  07/10/12  Yes Laveda Norman, MD  oxyCODONE-acetaminophen (ROXICET) 5-325 MG per tablet Take 1 tablet by mouth every 4 (four) hours as needed for pain. 07/19/12  Yes [name], MD  pantoprazole (PROTONIX) 40 MG tablet Take 40 mg by mouth every morning.   Yes Historical Provider, MD  sitaGLIPtin (JANUVIA) 100 MG tablet Take 1 tablet (100 mg total) by mouth daily. 05/08/12  Yes [name], MD  traMADol (ULTRAM) 50 MG tablet Take 1 tablet (50 mg total) by mouth every 6 (six) hours as needed. 07/10/12  Yes Laveda Norman, MD  zolpidem (AMBIEN) 10 MG tablet Take 1 tablet (10 mg total) by mouth at bedtime as needed. For sleep 05/08/12  Yes [name], MD   Allergies Allergies  Allergen Reactions  . Avelox (Moxifloxacin Hydrochloride) Itching    Headache  . Tadalafil     headache, backache    Family History Family History  Problem Relation Age of Onset  . Diabetes Father   . Breast cancer Maternal Aunt   . Heart attack Neg Hx   . Prostate cancer Neg Hx   . Colon cancer Neg Hx   . Cancer Neg Hx     colon or prostate  . Coronary artery disease Father   . Polycythemia Mother    Social History  reports that he quit smoking about 31 years ago. His smoking use included Cigarettes. He has a 62.5 pack-year smoking history. He has never used smokeless tobacco. He reports that he drinks alcohol. He reports that he does not use illicit drugs.    Vital Signs: Temp:  [97 F (36.1 C)-98 F (36.7 C)] 98 F (36.7 C) (10/15 0800) Pulse Rate:  [69-78] 70  (10/15 0800) Resp:  [13-29] 15  (10/15 0800) BP: (109-141)/(49-79) 118/70 mmHg (10/15 0800) SpO2:  [94 %-100 %] 94 % (10/15 0800) Weight:  [92.035 kg (202 lb 14.4 oz)] 92.035 kg (202 lb 14.4 oz) (10/15 0500)  Physical Examination: Gen. Pleasant, well-nourished, in no distress, normal affect ENT - no lesions, no post nasal drip Neck: No JVD, no thyromegaly, no carotid bruits Lungs: no use of accessory muscles, no dullness to percussion, clear without  rales or rhonchi , rt thoracotomy scar Cardiovascular: Rhythm regular, heart sounds  normal, no murmurs, no peripheral edema Abdomen: soft and non-tender, no hepatosplenomegaly, BS normal. Musculoskeletal: No deformities, no cyanosis or clubbing Neuro:  alert, non focal Skin:  Warm, no lesions/ rash   Principal Problem:  *Chest pain Active Problems:  HYPERTENSION  Diabetes mellitus  CHF (congestive heart failure)  Tachypnea  CBC    Component Value Date/Time   WBC 8.1 07/24/2012 0540   RBC 4.78 07/24/2012 0540  HGB 14.9 07/24/2012 0540   HCT 41.9 07/24/2012 0540   PLT 206 07/24/2012 0540   MCV 87.7 07/24/2012 0540   MCH 31.2 07/24/2012 0540   MCHC 35.6 07/24/2012 0540   RDW 12.6 07/24/2012 0540   LYMPHSABS 2.2 07/24/2012 0540   MONOABS 0.9 07/24/2012 0540   EOSABS 0.1 07/24/2012 0540   BASOSABS 0.0 07/24/2012 0540    BMET    Component Value Date/Time   NA 137 07/25/2012 0430   K 3.8 07/25/2012 0430   CL 101 07/25/2012 0430   CO2 27 07/25/2012 0430   GLUCOSE 56* 07/25/2012 0430   GLUCOSE 118* 10/18/2006 0000   BUN 21 07/25/2012 0430   CREATININE 0.86 07/25/2012 0430   CALCIUM 9.0 07/25/2012 0430   GFRNONAA 85* 07/25/2012 0430   GFRAA >90 07/25/2012 0430     ASSESSMENT AND PLAN  Abnormal CT scan/ left pleural thickening    A:  1. Pleural thickening is not impressive & does not need biopsy. He has no h/o asbestos exposure. This is likely related to recurrent effusions.He does have h/o rt decortication for pleural peel of unclear etiology.  2. Mosaic attenuation is a finding often seen in early CHF - this has resolved on current CT 3. Lymphadenopathy noted on CT from dec '12 has resolved probably also related to CHF 4. He does not have significant COPD as noted on dr wert's notes from outpt evaluation.  Please call as needed  Oretha Milch., M.D. Pulmonary and Critical Care Medicine [facility] Pager: [phone]  07/25/2012, 11:40  AM

## 2012-07-26 DIAGNOSIS — I5032 Chronic diastolic (congestive) heart failure: Secondary | ICD-10-CM

## 2012-07-26 DIAGNOSIS — J9610 Chronic respiratory failure, unspecified whether with hypoxia or hypercapnia: Secondary | ICD-10-CM

## 2012-07-26 DIAGNOSIS — R079 Chest pain, unspecified: Secondary | ICD-10-CM | POA: Diagnosis not present

## 2012-07-26 LAB — CBC
HCT: 42 % (ref 39.0–52.0)
Hemoglobin: 15 g/dL (ref 13.0–17.0)
MCH: 31.6 pg (ref 26.0–34.0)
MCHC: 35.7 g/dL (ref 30.0–36.0)
MCV: 88.4 fL (ref 78.0–100.0)
Platelets: 176 10*3/uL (ref 150–400)
RBC: 4.75 MIL/uL (ref 4.22–5.81)
RDW: 12.6 % (ref 11.5–15.5)
WBC: 9.7 10*3/uL (ref 4.0–10.5)

## 2012-07-26 LAB — GLUCOSE, CAPILLARY: Glucose-Capillary: 70 mg/dL (ref 70–99)

## 2012-07-26 LAB — BASIC METABOLIC PANEL
BUN: 18 mg/dL (ref 6–23)
CO2: 26 mEq/L (ref 19–32)
Calcium: 9 mg/dL (ref 8.4–10.5)
Chloride: 103 mEq/L (ref 96–112)
Creatinine, Ser: 0.85 mg/dL (ref 0.50–1.35)
GFR calc Af Amer: >90 mL/min (ref >90)
GFR calc non Af Amer: 86 mL/min — ABNORMAL LOW (ref >90)
Glucose, Bld: 64 mg/dL — ABNORMAL LOW (ref 70–99)
Potassium: 3.5 mEq/L (ref 3.5–5.1)
Sodium: 140 mEq/L (ref 135–145)

## 2012-07-26 MED ORDER — GLYBURIDE 5 MG PO TABS: 5.0000 mg | ORAL_TABLET | Freq: Two times a day (BID) | ORAL | Status: DC

## 2012-07-26 NOTE — Discharge Summary (Signed)
Death Summary  Bradley [name] EAV:[id number] DOB: [birth year] DOA: 10-Aug-2012  PCP: [name], MD Pt has appointment 08/01/12 at 2:45  Admit date: 08-10-12 Date of Death: August 13, 2012  Final Diagnoses:  Principal Problem:  *Chest pain Active Problems:  HYPERTENSION  Diabetes mellitus  CHF (congestive heart failure)  Tachypnea  Abnormal CT scan of lung   History of present illness:   71 year old male with history of nonischemic cardiomyopathy last EF measured was in November 2012 35-40%, atrial fibrillation status post AV nodal ablation and pacemaker placement, hypertension, diabetes mellitus type 2, hyperlipidemia, history of pleural effusion, who was recently admitted last month for intracranial hemorrhage thought to be secondary to hypotension and was taken off Coumadin and aspirin started experiencing chest pain and shortness of breath starting 07/22/12. Patient noticed chest pain at same time which was retrosternal and increase on exertion and some shortness of breath and got better on taking rest. The chest and recurred again 08-10-2012 morning and increase in exertion. It happened couple of times and got better with rest and lasted for 2 minutes. Patient came to the ER at [facility]. Chest x-ray revealed congestion. Cardiac enzyme was negative EKG were showing paced rhythm. Patient was given Lasix 40 mg IV and was transferred to [facility] for further management.   Hospital Course:  Chest pain with dyspnea  Unclear etiology to date and per cardiology not of cardiac source. Seen by pulmonology 07/25/12. Impression that pleural thickening not need biopsy. Hx rt decortication for pleural peel of unclear etiology. No significant COPD.  No events on telemetry over 24 hours  CE x 4 negative  Continues with intermittent chest pain. Will follow up with PCP for further work up ? Chronic lung disease / pulmonary fibrosis? / advanced COPD?  No additional workup needed per PCCm    Nonischemic cardiomyophathy - EF 45-50% September 2013  Followed by Sharrell Ku DM  A1C 5.9. Hypoglycemic episode. Appreciate evaluation of Diabetes Coordinator. Glyburide held during hospitalization. Decrease  dose on discharge per rec based on CBG trends.  Afib s/p AV node ablation w/ BiV PPM  Off Coumadin due to recent intracranial bleed  Rate controlled Reported hx of Pulm HTN  Recent echo w/o evidence to suggest severe Pulm HTN  HTN  Reasonably well controlled at this time - continue home med  HLD  Cont niacin and zocor  BPH  Asymptomatic at this time        Time: 4o minutes  Signed:  Gwenyth Bender NP [facility] 08-13-2012, 1:34 PM     Patient seen and examined by me.  Agree with plan to D/C Marlin Canary DO

## 2012-07-26 NOTE — Progress Notes (Signed)
Patient ID: Bradley [name], male   DOB: 12-12-1940, 71 y.o.   MRN: [id number] Subjective:  No chest pain today. Appreciate Dr. Reginia Naas input. No shortness of breath.  Objective:  Vital Signs in the last 24 hours: Temp:  [97.6 F (36.4 C)-98.4 F (36.9 C)] 97.6 F (36.4 C) (10/16 0959) Pulse Rate:  [69-80] 72  (10/16 0959) Resp:  [20-21] 20  (10/16 0959) BP: (100-128)/(50-72) 100/50 mmHg (10/16 0959) SpO2:  [98 %-100 %] 99 % (10/16 0959) Weight:  [197 lb 3.2 oz (89.449 kg)] 197 lb 3.2 oz (89.449 kg) (10/16 0453)  Intake/Output from previous day: 10/15 0701 - 10/16 0700 In: 900 [P.O.:900] Out: 1325 [Urine:1325] Intake/Output from this shift: Total I/O In: 220 [P.O.:220] Out: -   Physical Exam: Chronically ill appearing NAD HEENT: Unremarkable Neck:  7 cm JVD, no thyromegally Lungs:  Clear with no wheezes. Scattered rales HEART:  Regular rate rhythm, no murmurs, no rubs, no clicks Abd:  soft, positive bowel sounds, no organomegally, no rebound, no guarding Ext:  2 plus pulses, no edema, no cyanosis, no clubbing Skin:  No rashes no nodules Neuro:  CN II through XII intact, motor grossly intact  Lab Results:  Basename 07/26/12 0555 07/24/12 0540  WBC 9.7 8.1  HGB 15.0 14.9  PLT 176 206    Basename 07/26/12 0555 07/25/12 0430  NA 140 137  K 3.5 3.8  CL 103 101  CO2 26 27  GLUCOSE 64* 56*  BUN 18 21  CREATININE 0.85 0.86    Basename 07/24/12 1100 07/24/12 0540  TROPONINI <0.30 <0.30   Hepatic Function Panel  Basename 07/24/12 0540  PROT 7.1  ALBUMIN 3.6  AST 18  ALT 20  ALKPHOS 75  BILITOT 0.6  BILIDIR --  IBILI --   No results found for this basename: CHOL in the last 72 hours No results found for this basename: PROTIME in the last 72 hours  Imaging: Ct Angio Chest Pe W/cm &/or Wo Cm  07/24/2012  *RADIOLOGY REPORT*  Clinical Data: Shortness of breath.  Follow up of abnormal chest CT.  Lymphadenopathy.  CT ANGIOGRAPHY CHEST  Technique:  Multidetector CT  imaging of the chest using the standard protocol during bolus administration of intravenous contrast. Multiplanar reconstructed images including MIPs were obtained and reviewed to evaluate the vascular anatomy.  Contrast: 80 ml of Omnipaque 350  Comparison: plain film of 08/10/2012.  Most recent CT of 09/20/2011.  Findings: Lung windows demonstrate mild motion degradation inferiorly.  Nonspecific bronchial wall thickening to the lower lobes, greater left than right.  This is improved.  Improvement aeration, with resolution of previous described mosaic attenuation.  Minimal nonspecific interstitial thickening at the left lung base peripherally. A probable subpleural lymph node along the left major fissure on image 43 measuring 3 mm.  Soft tissue windows:  The quality of this exam for evaluation of pulmonary embolism is good.  Apparent hypoenhancement within a left lower lobe pulmonary artery branch on image 149 of series 6 is favored to be due to mild motion artifact.  Otherwise, no evidence of pulmonary embolism.  No supraclavicular adenopathy.  Normal aortic caliber without dissection.  Moderate cardiomegaly, with coronary artery atherosclerosis.  Trace left-sided pleural fluid/thickening laterally.  Resolved right-sided pleural fluid.  Improvement in thoracic adenopathy.  A right paratracheal node measures 1.0 cm on image 30 versus 1.9 cm on the prior. Azygo-esophageal recess node measures 1.6 cm today versus 2.3 cm on the prior.  No hilar adenopathy.  Decreased  prevascular nodal size.  Limited abdominal imaging demonstrates no significant findings.  No acute osseous abnormality.  IMPRESSION: 1.  Mildly motion degraded exam.  Given this factor, no evidence of pulmonary embolism.  2.  Improved to resolved thoracic adenopathy.  This is likely reactive or related to congestive heart failure.  3.  Improved aeration with resolution of mosaic attenuation.  4.  Resolved right pleural effusion.  Trace lateral left pleural  fluid/thickening remains.   Original Report Authenticated By: Consuello Bossier, M.D.     Cardiac Studies: Tele - atrial fib with ventricular pacing Assessment/Plan:  1. Pleuritic chest pain 2. Chronic atrial fib 3. A/C systolic and diastolic CHF 4. Pulmonary hypertension 5. Prior intraventricular hemorrhage Rec: I have discussed CT findings with multiple pulmonary consultants. His pleural thickening is mild on the left. I would recommend treating with NSAiDS. Would discharge on Motrin 200-400 mg two-three times daily as needed for chest pain. Ok to discharge from my perspective.   LOS: 3 days    Lewayne Bunting, M.D. 07/26/2012, 1:47 PM

## 2012-07-26 NOTE — Progress Notes (Signed)
[facility] PROGRESS NOTE  Bradley [name] ZOX:[id number] DOB: Jan 17, 1941 DOA: 07/23/2012 PCP: [name], MD  Assessment/Plan: Chest pain with dyspnea  Unclear etiology to date and per cardiology not of cardiac source. Seen by pulmonology 07/25/12. Impression that pleural thickening not need biopsy. Hx rt decortication for pleural peel of unclear etiology. No significant COPD. No events on telemetry over 24 hours  CE x 4 negative  ? Chronic lung disease / pulmonary fibrosis? / advanced COPD?  No additional workup needed per PCCm  Nonischemic cardiomyophathy - EF 45-50% September 2013  Followed by Sharrell Ku DM  A1C 5.9. Hypoglycemic episode. Appreciate evaluation of Diabetes Coordinator. Will discontinue glyburide for now. Monitor closely. Consider decreasing dose on discharge per rec based on CBG trends.  Follow trend  Afib s/p AV node ablation w/ BiV PPM  Off Coumadin due to recent intracranial bleed  Rate controlled Reported hx of Pulm HTN  Recent echo w/o evidence to suggest severe Pulm HTN  HTN  Reasonably well controlled at this time - follow trend  HLD  Cont niacin and zocor  BPH  Asymptomatic at this time     Code Status: full Family Communication: wife on telephone.  Disposition Plan: Home perhaps this afternoon once cards ok's   Consultants:  cardilogy  PCCM  Diabetes coordinator  Procedures:  CT angio  Chest xray  Antibiotics:  none  HPI/Subjective: Awake alert oriented x3. Continues to report pain/pressure chest but improved.   Objective: Filed Vitals:   07/25/12 1424 07/25/12 1745 07/25/12 2103 07/26/12 0453  BP: 112/59 126/72 128/70 108/63  Pulse: 69 79 80 80  Temp: 97.7 F (36.5 C) 97.6 F (36.4 C) 98.4 F (36.9 C) 98.1 F (36.7 C)  TempSrc: Oral Oral Oral Oral  Resp: 21 21 20 20   Height:      Weight:    89.449 kg (197 lb 3.2 oz)  SpO2: 100% 98% 98% 99%    Intake/Output Summary (Last 24 hours) at 07/26/12 0855 Last data  filed at 07/26/12 0600  Gross per 24 hour  Intake    900 ml  Output   1075 ml  Net   -175 ml   Filed Weights   07/25/12 0500 07/25/12 1249 07/26/12 0453  Weight: 92.035 kg (202 lb 14.4 oz) 89.7 kg (197 lb 12 oz) 89.449 kg (197 lb 3.2 oz)    Exam:   General:  Awake, alert well nourished NAD  Cardiovascular: irregularly irregular, No MGR, no LEE PPP  Respiratory: normal effort but uses pursed lip breathing. BSCTAB No wheeze, no rales  Abdomen: round, soft +BS non-tender to palpation  Data Reviewed: Basic Metabolic Panel:  Lab 07/26/12 8119 07/25/12 0430 07/24/12 0540 07/23/12 1545  NA 140 137 141 138  K 3.5 3.8 4.0 4.0  CL 103 101 101 101  CO2 26 27 31 26   GLUCOSE 64* 56* 69* 143*  BUN 18 21 17 19   CREATININE 0.85 0.86 0.96 0.90  CALCIUM 9.0 9.0 9.2 9.3  MG -- -- -- --  PHOS -- -- -- --   Liver Function Tests:  Lab 07/24/12 0540 07/23/12 1545  AST 18 17  ALT 20 18  ALKPHOS 75 76  BILITOT 0.6 0.6  PROT 7.1 7.2  ALBUMIN 3.6 3.6   No results found for this basename: LIPASE:5,AMYLASE:5 in the last 168 hours No results found for this basename: AMMONIA:5 in the last 168 hours CBC:  Lab 07/26/12 0555 07/24/12 0540 07/23/12 1545  WBC 9.7 8.1 7.2  NEUTROABS -- 4.9 4.9  HGB 15.0 14.9 15.3  HCT 42.0 41.9 42.1  MCV 88.4 87.7 85.9  PLT 176 206 233   Cardiac Enzymes:  Lab 07/24/12 1100 07/24/12 0540 07/23/12 2228 07/23/12 1545  CKTOTAL -- -- -- --  CKMB -- -- -- --  CKMBINDEX -- -- -- --  TROPONINI <0.30 <0.30 <0.30 <0.30   BNP (last 3 results)  Basename 07/24/12 0540 07/23/12 1545 07/01/12 1445  PROBNP 345.1* 566.0* 1377.0*   CBG:  Lab 07/26/12 0625 07/25/12 2026 07/25/12 1621 07/25/12 1214 07/25/12 0850  GLUCAP 70 171* 121* 120* 127*    Recent Results (from the past 240 hour(s))  MRSA PCR SCREENING     Status: Normal   Collection Time   07/23/12  9:07 PM      Component Value Range Status Comment   MRSA by PCR NEGATIVE  NEGATIVE Final       Studies: Ct Angio Chest Pe W/cm &/or Wo Cm  07/24/2012  *RADIOLOGY REPORT*  Clinical Data: Shortness of breath.  Follow up of abnormal chest CT.  Lymphadenopathy.  CT ANGIOGRAPHY CHEST  Technique:  Multidetector CT imaging of the chest using the standard protocol during bolus administration of intravenous contrast. Multiplanar reconstructed images including MIPs were obtained and reviewed to evaluate the vascular anatomy.  Contrast: 80 ml of Omnipaque 350  Comparison: plain film of 08/10/2012.  Most recent CT of 09/20/2011.  Findings: Lung windows demonstrate mild motion degradation inferiorly.  Nonspecific bronchial wall thickening to the lower lobes, greater left than right.  This is improved.  Improvement aeration, with resolution of previous described mosaic attenuation.  Minimal nonspecific interstitial thickening at the left lung base peripherally. A probable subpleural lymph node along the left major fissure on image 43 measuring 3 mm.  Soft tissue windows:  The quality of this exam for evaluation of pulmonary embolism is good.  Apparent hypoenhancement within a left lower lobe pulmonary artery branch on image 149 of series 6 is favored to be due to mild motion artifact.  Otherwise, no evidence of pulmonary embolism.  No supraclavicular adenopathy.  Normal aortic caliber without dissection.  Moderate cardiomegaly, with coronary artery atherosclerosis.  Trace left-sided pleural fluid/thickening laterally.  Resolved right-sided pleural fluid.  Improvement in thoracic adenopathy.  A right paratracheal node measures 1.0 cm on image 30 versus 1.9 cm on the prior. Azygo-esophageal recess node measures 1.6 cm today versus 2.3 cm on the prior.  No hilar adenopathy.  Decreased prevascular nodal size.  Limited abdominal imaging demonstrates no significant findings.  No acute osseous abnormality.  IMPRESSION: 1.  Mildly motion degraded exam.  Given this factor, no evidence of pulmonary embolism.  2.  Improved to  resolved thoracic adenopathy.  This is likely reactive or related to congestive heart failure.  3.  Improved aeration with resolution of mosaic attenuation.  4.  Resolved right pleural effusion.  Trace lateral left pleural fluid/thickening remains.   Original Report Authenticated By: Consuello Bossier, M.D.     Scheduled Meds:   . carvedilol  37.5 mg Oral BID WC  . escitalopram  10 mg Oral QHS  . furosemide  60 mg Oral BID  . insulin aspart  0-9 Units Subcutaneous TID WC  . linagliptin  5 mg Oral Daily  . losartan  100 mg Oral Daily  . multivitamin with minerals  1 tablet Oral Daily  . multivitamin with minerals  1 tablet Oral Daily  . niacin  1,000 mg Oral QHS  .  pantoprazole  40 mg Oral q morning - 10a  . simvastatin  10 mg Oral q1800  . sodium chloride  3 mL Intravenous Q12H  . DISCONTD: glyBURIDE  10 mg Oral BID WC   Continuous Infusions:   Principal Problem:  *Chest pain Active Problems:  HYPERTENSION  Diabetes mellitus  CHF (congestive heart failure)  Tachypnea  Abnormal CT scan of lung    Time spent: 30 minutes    [facility] M  [facility]  If 8PM-8AM, please contact night-coverage at [email or web address], password [facility] 07/26/2012, 8:55 AM  LOS: 3 days

## 2012-07-26 NOTE — Progress Notes (Signed)
Inpatient Diabetes Program Recommendations  AACE/ADA: New Consensus Statement on Inpatient Glycemic Control (2013)  Target Ranges:  Prepandial:   less than 140 mg/dL      Peak postprandial:   less than 180 mg/dL (1-2 hours)      Critically ill patients:  140 - 180 mg/dL    Agree with discontinuation of Glyburide during this admission.  This Diabetes Coordinator spoke with the patient and his wife yesterday (Tuesday) and both report that patient is experiencing HYPOglycemia at home in the morning hours.  Home dose may need to be adjusted.  Patient asked if we could share the information with his primary MD, Dr. Drue [name].  Could this information be sent via EPIC at discharge?  Patient said he will also discuss with Dr. Drue [name].  The patient did not have any further questions or concerns.    Thank you  Bradley [name] [facility] Inpatient Diabetes Coordinator [phone] after 5pm

## 2012-07-26 NOTE — Progress Notes (Signed)
Patient seen and examined by me.  Plan to D/C patient today.    Marlin Canary DO

## 2012-07-27 LAB — GLUCOSE, CAPILLARY: Glucose-Capillary: 169 mg/dL — ABNORMAL HIGH (ref 70–99)

## 2012-07-28 ENCOUNTER — Telehealth: Payer: Self-pay | Admitting: Internal Medicine

## 2012-07-28 NOTE — Telephone Encounter (Signed)
Spoke to pt & he states that he is feeling better. I advised him that if he starts having low blood pressure, abdominal pain, or shakiness again then he needs to go to the ER. Pt understood & said that he would.

## 2012-07-28 NOTE — Telephone Encounter (Signed)
[name] is calling about Bradley [name] being in and out of the hospital for past month. Was just discharged yesterday-07/27/12 from [facility] and now he is feeling cold, weak, shaky and his mid abdomen is hurting on and off. He is eating and drinking normally. BG=178 Temp was low< 96.8 oral. Warmed with blanket and came up to 97.5 oral within 20 min and now less shaky. BP = 98/72 (sitting) Denies dizziness.  Stomach hurts worse if he lays down. Triage per Weakness and Abdominal Pain Protocols and appointment advised within 4 hours for "muscle weakness that begins in feet or lower legs and continues to spread up the body." Advised to rest with warm blanket in semi reclining position and to eat small frequent meals. He has upcoming f/u appointment on Tues but wanting to be seen sooner to have medications checked since he started on 2 new meds. Please call back to advise.

## 2012-07-28 NOTE — Telephone Encounter (Signed)
Status post  2 recent hospitalizations, now with low blood pressure, abdominal pain, shakiness. I agree that he needs to be seen ---> please advise patient to go to the ER J Anasofia Micallef md 4.05 pm

## 2012-08-01 ENCOUNTER — Ambulatory Visit (INDEPENDENT_AMBULATORY_CARE_PROVIDER_SITE_OTHER): Payer: Medicare Other | Admitting: Internal Medicine

## 2012-08-01 VITALS — BP 130/78 | HR 100 | Temp 97.8°F | Wt 207.0 lb

## 2012-08-01 DIAGNOSIS — E119 Type 2 diabetes mellitus without complications: Secondary | ICD-10-CM | POA: Diagnosis not present

## 2012-08-01 DIAGNOSIS — R079 Chest pain, unspecified: Secondary | ICD-10-CM | POA: Diagnosis not present

## 2012-08-01 DIAGNOSIS — I10 Essential (primary) hypertension: Secondary | ICD-10-CM

## 2012-08-01 DIAGNOSIS — M2550 Pain in unspecified joint: Secondary | ICD-10-CM | POA: Diagnosis not present

## 2012-08-01 DIAGNOSIS — I4891 Unspecified atrial fibrillation: Secondary | ICD-10-CM

## 2012-08-01 NOTE — Progress Notes (Signed)
  Subjective:    Patient ID: Bradley [name], male    DOB: Nov 24, 1940, 71 y.o.   MRN: [id number]  HPI Hospital followup, admitted from 07/23/2012 to 07/26/2012 Had chest pain upon admission, after w/u, cardiology diagnosed it as a noncardiac pain. Had a thickening of the pleura, saw PCCM, no need for biopsy at this time, no significant COPD. As far as the diabetes, glyburide was held due to low CBGs. last BMP and CBC normal Chest x-rays show cardiomegaly and vascular congestion, CT of the chest showed no pulmonary emboli, resolved mosaic attenuation, resolved pleural effusion, trace bilateral pleural thickening    Past Medical History:   06-2012, intracranial bleed, d./c coumadin HTN (at some point hard to control, saw Dr Lowell Guitar)   AODM dx 1999   Anxiety   BPH---saw Dr [name] 2004 , normal renal u/s  INSOMNIA, CHRONIC   CV:  ---ATRIAL FIBRILLATION, PAROXYSMAL (Dr Ladona Ridgel)  ; off coumadin 01-980  ---03-2007.Marland KitchenMarland KitchenMarland KitchenStress test neg, ECHO 55%   ---04-2007: carciac cath w/ no high grade stenosis, + P-HTN   ---CHF   ---s/p AV node ablation & BiV PPM implantation 09/08/11   PULMONARY  ---2008: h/o recurrent loculated right plural effusion, s/p eval Dr [name] , Dr Laneta Simmers. W/U included thoracentesis   --09-2011 aCT of the chest, several lymphadenopathies. Sees pulmonary   Leg ulcer, dx end of 2011, ABI neg 10-2010, saw vascular (Dr Arbie Cookey 10-2010) -- venous insuff. , conservative treatment    Past Surgical History:  Status post large pneumothorax with fibrothorax.    Status post lung decortication -Dr. Laneta Simmers  Status post cardiac catheterization     Family History: MI-- no DM-- F Colon ca-- no prostate ca--no  Social History: Married, 1 son, moved from [place] 2003, lives with his wife.    Tobacco-- quit in the 80s. (used to smoke 2.5 ppd for 25 years) Alcohol-- very rarely No drugs.    Review of Systems here with his wife Since he left the hospital he is doing okay. Had a joint aches and  pains----> symptoms resolved. He took oxycodone for a while, when he abruptly stopped taking it he felt extremely bad, see phone note from 07/28/2012, on retrospect, he think the symptoms were from a sort of oxycodone withdrawal. At this point, he denies chest pain, shortness of breath, no lower extremity edema. No fever or chills No anxiety-depression Glyburide dose was decreased to one a day due to low blood sugars, her readings in the morning are about 110, 115, 130     Objective:   Physical Exam General -- alert, well-developed, and overweight appearing. No apparent distress.  Lungs -- normal respiratory effort, no intercostal retractions, no accessory muscle use, and slt decreasel breath sounds.   Heart-- seems regular today Extremities-- no pretibial edema bilaterally  Neurologic-- alert & oriented X3 and strength normal in all extremities. Psych-- Cognition and judgment appear intact. Alert and cooperative with normal attention span and concentration.  not anxious appearing and not depressed appearing.Seems in good spirits.     Assessment & Plan:  Today , I spent more than with the patient, >50% of the time counseling, and /or reviewing the chart and labs ordered by other providers

## 2012-08-01 NOTE — Assessment & Plan Note (Addendum)
After the hospital admission few days ago, he decrease glyburide to 1 tablet daily, CBGs are now better around 110, 120 in AM. No change

## 2012-08-01 NOTE — Assessment & Plan Note (Signed)
Since last office visit, September was normal, took oxycodone temporarily, didn't tolerate it well (see review of systems). Currently asymptomatic

## 2012-08-01 NOTE — Assessment & Plan Note (Signed)
On  no Coumadin or aspirin due to 2 recent intracranial bleed. Wi'll see cardiology December 2013

## 2012-08-01 NOTE — Assessment & Plan Note (Signed)
Resolved, no cardiac.

## 2012-08-01 NOTE — Assessment & Plan Note (Signed)
Well controlled 

## 2012-08-02 ENCOUNTER — Encounter: Payer: Self-pay | Admitting: Internal Medicine

## 2012-08-21 ENCOUNTER — Ambulatory Visit: Payer: Self-pay | Admitting: Internal Medicine

## 2012-08-21 DIAGNOSIS — I4891 Unspecified atrial fibrillation: Secondary | ICD-10-CM

## 2012-09-06 ENCOUNTER — Ambulatory Visit: Payer: Medicare Other | Admitting: Internal Medicine

## 2012-09-08 ENCOUNTER — Encounter: Payer: Self-pay | Admitting: *Deleted

## 2012-09-12 ENCOUNTER — Encounter: Payer: Self-pay | Admitting: Internal Medicine

## 2012-09-12 ENCOUNTER — Ambulatory Visit (INDEPENDENT_AMBULATORY_CARE_PROVIDER_SITE_OTHER): Payer: Medicare Other | Admitting: Internal Medicine

## 2012-09-12 VITALS — BP 138/70 | HR 90 | Ht 68.5 in | Wt 219.4 lb

## 2012-09-12 DIAGNOSIS — I509 Heart failure, unspecified: Secondary | ICD-10-CM

## 2012-09-12 DIAGNOSIS — I428 Other cardiomyopathies: Secondary | ICD-10-CM

## 2012-09-12 DIAGNOSIS — I4891 Unspecified atrial fibrillation: Secondary | ICD-10-CM

## 2012-09-12 DIAGNOSIS — I10 Essential (primary) hypertension: Secondary | ICD-10-CM

## 2012-09-12 DIAGNOSIS — I5041 Acute combined systolic (congestive) and diastolic (congestive) heart failure: Secondary | ICD-10-CM

## 2012-09-12 LAB — PACEMAKER DEVICE OBSERVATION
AL IMPEDENCE PM: 4047 Ohm
ATRIAL PACING PM: 0
BATTERY VOLTAGE: 3.0185 V
LV LEAD THRESHOLD: 0.75 V
RV LEAD IMPEDENCE PM: 684 Ohm
RV LEAD THRESHOLD: 0.75 V
VENTRICULAR PACING PM: 98.61

## 2012-09-12 NOTE — Assessment & Plan Note (Signed)
His ventricular rate is now well-controlled after AV node ablation. He is off anticoagulation and is no longer a candidate.

## 2012-09-12 NOTE — Patient Instructions (Signed)
Your physician recommends that you schedule a follow-up appointment in: 3 months with Dr Ladona Ridgel   Increase your Furosemide to 2 tablets twice daily for 4 days or until weight down 5 pounds

## 2012-09-12 NOTE — Assessment & Plan Note (Signed)
His blood pressure is recently well controlled. He doesn't that to dietary indiscretion with sodium. I've counseled the patient on the importance of reducing his salt intake, and taking his medications as prescribed.

## 2012-09-12 NOTE — Assessment & Plan Note (Signed)
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 His weight is up approximately 10 pounds. I've asked the patient to up titrate his Lasix for several days in hopes of reducing his fluid weight. I also note that his fluid index is up on pacemaker interrogation.

## 2012-09-12 NOTE — Progress Notes (Signed)
HPI [name] returns today for followup. He is a very pleasant 71 year old man with both systolic and diastolic heart failure, chronic atrial fibrillation, status post AV node ablation and pacemaker insertion. He also has hypertension and diabetes. Because of a history of intracranial bleeding, his anticoagulation was discontinued. The patient admits to dietary indiscretion. Over the Thanksgiving holiday, he gained several pounds and his breathing is minimally worse. Allergies  Allergen Reactions  . Avelox (Moxifloxacin Hydrochloride) Itching    Headache  . Tadalafil     headache, backache     Current Outpatient Prescriptions  Medication Sig Dispense Refill  . amLODipine (NORVASC) 10 MG tablet Take 1 tablet (10 mg total) by mouth daily.  30 tablet  1  . carvedilol (COREG) 25 MG tablet Take 1.5 tablets (37.5 mg total) by mouth 2 (two) times daily with a meal.  270 tablet  3  . escitalopram (LEXAPRO) 10 MG tablet Take 1 tablet (10 mg total) by mouth at bedtime.  30 tablet  1  . furosemide (LASIX) 40 MG tablet Take 1.5 tablets (60 mg total) by mouth 2 (two) times daily.  270 tablet  3  . glyBURIDE (DIABETA) 5 MG tablet Take 5 mg by mouth daily.      Marland Kitchen losartan (COZAAR) 100 MG tablet Take 1 tablet (100 mg total) by mouth daily.  90 tablet  3  . Multiple Vitamins-Minerals (MULTIVITAMINS THER. W/MINERALS) TABS Take 1 tablet by mouth daily.        . niacin-lovastatin (ADVICOR) 1000-20 MG 24 hr tablet Take 1 tablet by mouth at bedtime.  90 tablet  3  . nitroGLYCERIN (NITROSTAT) 0.4 MG SL tablet Place 1 tablet (0.4 mg total) under the tongue every 5 (five) minutes as needed for chest pain.  90 tablet  0  . pantoprazole (PROTONIX) 40 MG tablet Take 40 mg by mouth every morning.      . sitaGLIPtin (JANUVIA) 100 MG tablet Take 1 tablet (100 mg total) by mouth daily.  90 tablet  3  . zolpidem (AMBIEN) 10 MG tablet Take 1 tablet (10 mg total) by mouth at bedtime as needed. For sleep  90 tablet  1      Past Medical History  Diagnosis Date  . HTN (hypertension)   . HLD (hyperlipidemia)   . DM (diabetes mellitus) 1999    adult onset  . BPH (benign prostatic hyperplasia)     Saw Dr [name] 2004, normal renal u/s  . Leg ulcer 2011  . CHF (congestive heart failure)     Thought primarily to be non-systolic although EF down (EF 45-40% 12/2010, down to 35-40% 09/05/11), cath 2008 with no CAD, nuclear study 07/2011 showing Small area of reversibility in the distal ant/lat wall the left ventricle suspicious for ischemia/septal wall HK but felt to be low risk  (per D/C Summary 07/2011)  . Pleural effusion 2008    S/p decortication  . Insomnia   . Atrial fibrillation     s/p AV node ablation & BiV PPM implantation 09/08/11 (op dictation pending)  . Sinoatrial node dysfunction   . Headache   . Pulmonary HTN   . Coronary artery disease   . Anginal pain   . Shortness of breath   . Peripheral vascular disease   . Pneumonia   . Arthritis   . Anemia     intermittent    ROS:   All systems reviewed and negative except as noted in the HPI.   Past Surgical History  Procedure Date  . Pleural scarification   . Lung decortication   . Colonoscopy 03/10/11    normal  . Pneumothorax with fibrothorax   . Insert / replace / remove pacemaker 09/08/11    pacemaker placement  . Tonsillectomy      Family History  Problem Relation Age of Onset  . Diabetes Father   . Breast cancer Maternal Aunt   . Heart attack Neg Hx   . Prostate cancer Neg Hx   . Colon cancer Neg Hx   . Cancer Neg Hx     colon or prostate  . Coronary artery disease Father   . Polycythemia Mother      History   Social History  . Marital Status: Married    Spouse Name: [place]    Number of Children: 1  . 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 Smoking status: Former Smoker -- 2.5 packs/day for 25 years    Types: Cigarettes    Quit  date: 10/11/1980  . Smokeless tobacco: Never Used  . Alcohol Use: Yes     Comment: occassionally once a year  . Drug Use: No  . Sexually Active: No   Other Topics Concern  . Not on file   Social History Narrative   Moved from [place] 2003.Marland KitchenHe lives w/ his wife      BP 138/70  Pulse 90  Ht 5' 8.5" (1.74 m)  Wt 219 lb 6.4 oz (99.519 kg)  BMI 32.87 kg/m2  Physical Exam:  Well appearing 71 year old man, NAD HEENT: Unremarkable Neck:  9 cm JVD, no thyromegally Lungs:  Clear except for rales in the bases but no wheezes or rhonchi. No increased work of breathing HEART:  Regular rate rhythm, no murmurs, no rubs, no clicks Abd:  soft, positive bowel sounds, no organomegally, no rebound, no guarding Ext:  2 plus pulses, no edema, no cyanosis, no clubbing Skin:  No rashes no nodules Neuro:  CN II through XII intact, motor grossly intact  DEVICE  Normal device function.  See PaceArt for details.   Assess/Plan:

## 2012-09-27 ENCOUNTER — Encounter (HOSPITAL_COMMUNITY): Payer: Self-pay

## 2012-09-27 ENCOUNTER — Emergency Department (HOSPITAL_COMMUNITY): Payer: Medicare Other

## 2012-09-27 ENCOUNTER — Inpatient Hospital Stay (HOSPITAL_COMMUNITY)
Admission: EM | Admit: 2012-09-27 | Discharge: 2012-09-29 | DRG: 194 | Disposition: A | Discharge status: Home / Self Care | Payer: Medicare Other | Attending: Internal Medicine | Admitting: Internal Medicine

## 2012-09-27 DIAGNOSIS — E785 Hyperlipidemia, unspecified: Secondary | ICD-10-CM | POA: Diagnosis present

## 2012-09-27 DIAGNOSIS — Z833 Family history of diabetes mellitus: Secondary | ICD-10-CM

## 2012-09-27 DIAGNOSIS — I4891 Unspecified atrial fibrillation: Secondary | ICD-10-CM

## 2012-09-27 DIAGNOSIS — I5042 Chronic combined systolic (congestive) and diastolic (congestive) heart failure: Secondary | ICD-10-CM

## 2012-09-27 DIAGNOSIS — I2510 Atherosclerotic heart disease of native coronary artery without angina pectoris: Secondary | ICD-10-CM | POA: Diagnosis present

## 2012-09-27 DIAGNOSIS — J189 Pneumonia, unspecified organism: Principal | ICD-10-CM

## 2012-09-27 DIAGNOSIS — I5043 Acute on chronic combined systolic (congestive) and diastolic (congestive) heart failure: Secondary | ICD-10-CM

## 2012-09-27 DIAGNOSIS — Z79899 Other long term (current) drug therapy: Secondary | ICD-10-CM

## 2012-09-27 DIAGNOSIS — E119 Type 2 diabetes mellitus without complications: Secondary | ICD-10-CM

## 2012-09-27 DIAGNOSIS — Z87891 Personal history of nicotine dependence: Secondary | ICD-10-CM

## 2012-09-27 DIAGNOSIS — J441 Chronic obstructive pulmonary disease with (acute) exacerbation: Secondary | ICD-10-CM

## 2012-09-27 DIAGNOSIS — Z950 Presence of cardiac pacemaker: Secondary | ICD-10-CM

## 2012-09-27 DIAGNOSIS — Z888 Allergy status to other drugs, medicaments and biological substances status: Secondary | ICD-10-CM

## 2012-09-27 DIAGNOSIS — I509 Heart failure, unspecified: Secondary | ICD-10-CM

## 2012-09-27 DIAGNOSIS — I10 Essential (primary) hypertension: Secondary | ICD-10-CM

## 2012-09-27 DIAGNOSIS — R079 Chest pain, unspecified: Secondary | ICD-10-CM

## 2012-09-27 DIAGNOSIS — E1169 Type 2 diabetes mellitus with other specified complication: Secondary | ICD-10-CM | POA: Diagnosis present

## 2012-09-27 DIAGNOSIS — Z8249 Family history of ischemic heart disease and other diseases of the circulatory system: Secondary | ICD-10-CM

## 2012-09-27 DIAGNOSIS — N400 Enlarged prostate without lower urinary tract symptoms: Secondary | ICD-10-CM | POA: Diagnosis present

## 2012-09-27 HISTORY — DX: Cardiac murmur, unspecified: R01.1

## 2012-09-27 HISTORY — DX: Presence of cardiac pacemaker: Z95.0

## 2012-09-27 HISTORY — DX: Migraine, unspecified, not intractable, without status migrainosus: G43.909

## 2012-09-27 HISTORY — DX: Type 2 diabetes mellitus without complications: E11.9

## 2012-09-27 HISTORY — DX: Dependence on supplemental oxygen: Z99.81

## 2012-09-27 LAB — CBC
HCT: 38.1 % — ABNORMAL LOW (ref 39.0–52.0)
HCT: 39.3 % (ref 39.0–52.0)
Hemoglobin: 12.9 g/dL — ABNORMAL LOW (ref 13.0–17.0)
Hemoglobin: 13.5 g/dL (ref 13.0–17.0)
MCH: 30.9 pg (ref 26.0–34.0)
MCH: 31.7 pg (ref 26.0–34.0)
MCHC: 33.9 g/dL (ref 30.0–36.0)
MCHC: 34.4 g/dL (ref 30.0–36.0)
MCV: 91.4 fL (ref 78.0–100.0)
MCV: 92.3 fL (ref 78.0–100.0)
Platelets: 171 10*3/uL (ref 150–400)
Platelets: 152 10*3/uL (ref 150–400)
RBC: 4.17 MIL/uL — ABNORMAL LOW (ref 4.22–5.81)
RBC: 4.26 MIL/uL (ref 4.22–5.81)
RDW: 13.8 % (ref 11.5–15.5)
RDW: 13.8 % (ref 11.5–15.5)
WBC: 12.1 10*3/uL — ABNORMAL HIGH (ref 4.0–10.5)
WBC: 9.7 10*3/uL (ref 4.0–10.5)

## 2012-09-27 LAB — CREATININE, SERUM
Creatinine, Ser: 0.85 mg/dL (ref 0.50–1.35)
GFR calc Af Amer: >90 mL/min (ref >90)
GFR calc non Af Amer: 86 mL/min — ABNORMAL LOW (ref >90)

## 2012-09-27 LAB — COMPREHENSIVE METABOLIC PANEL
ALT: 12 U/L (ref 0–53)
AST: 16 U/L (ref 0–37)
Albumin: 3.3 g/dL — ABNORMAL LOW (ref 3.5–5.2)
Alkaline Phosphatase: 83 U/L (ref 39–117)
BUN: 14 mg/dL (ref 6–23)
CO2: 28 mEq/L (ref 19–32)
Calcium: 8.8 mg/dL (ref 8.4–10.5)
Chloride: 99 mEq/L (ref 96–112)
Creatinine, Ser: 0.89 mg/dL (ref 0.50–1.35)
GFR calc Af Amer: >90 mL/min (ref >90)
GFR calc non Af Amer: 84 mL/min — ABNORMAL LOW (ref >90)
Glucose, Bld: 157 mg/dL — ABNORMAL HIGH (ref 70–99)
Potassium: 3.7 mEq/L (ref 3.5–5.1)
Sodium: 137 mEq/L (ref 135–145)
Total Bilirubin: 1.1 mg/dL (ref 0.3–1.2)
Total Protein: 7.1 g/dL (ref 6.0–8.3)

## 2012-09-27 LAB — GLUCOSE, CAPILLARY
Glucose-Capillary: 150 mg/dL — ABNORMAL HIGH (ref 70–99)
Glucose-Capillary: 240 mg/dL — ABNORMAL HIGH (ref 70–99)

## 2012-09-27 LAB — TROPONIN I
Troponin I: <0.3 ng/mL (ref <0.30)
Troponin I: <0.3 ng/mL (ref <0.30)

## 2012-09-27 LAB — EXPECTORATED SPUTUM ASSESSMENT W GRAM STAIN, RFLX TO RESP C

## 2012-09-27 LAB — POCT I-STAT TROPONIN I: Troponin i, poc: 0 ng/mL (ref 0.00–0.08)

## 2012-09-27 LAB — HIV ANTIBODY (ROUTINE TESTING W REFLEX): HIV: NONREACTIVE

## 2012-09-27 LAB — PRO B NATRIURETIC PEPTIDE: Pro B Natriuretic peptide (BNP): 1608 pg/mL — ABNORMAL HIGH (ref 0–125)

## 2012-09-27 LAB — STREP PNEUMONIAE URINARY ANTIGEN: Strep Pneumo Urinary Antigen: NEGATIVE

## 2012-09-27 MED ORDER — GLYBURIDE 5 MG PO TABS
5.0000 mg | ORAL_TABLET | Freq: Every day | ORAL | Status: DC
  Administered 2012-09-28 – 2012-09-29 (×2): 5 mg via ORAL
  Filled 2012-09-27 (×3): qty 1

## 2012-09-27 MED ORDER — CARVEDILOL 25 MG PO TABS
37.5000 mg | ORAL_TABLET | Freq: Two times a day (BID) | ORAL | Status: DC
  Administered 2012-09-27 – 2012-09-29 (×4): 37.5 mg via ORAL
  Filled 2012-09-27 (×6): qty 1

## 2012-09-27 MED ORDER — NIACIN-LOVASTATIN ER 1000-20 MG PO TB24: 1.0000 | ORAL_TABLET | Freq: Every day | ORAL | Status: DC

## 2012-09-27 MED ORDER — ONDANSETRON HCL 4 MG/2ML IJ SOLN: 4.0000 mg | Freq: Four times a day (QID) | INTRAMUSCULAR | Status: DC | PRN

## 2012-09-27 MED ORDER — CEFTRIAXONE SODIUM 1 G IJ SOLR
1.0000 g | Freq: Once | INTRAMUSCULAR | Status: AC
  Administered 2012-09-27: 1 g via INTRAVENOUS
  Filled 2012-09-27: qty 10

## 2012-09-27 MED ORDER — OXYCODONE HCL 5 MG PO TABS: 5.0000 mg | ORAL_TABLET | ORAL | Status: DC | PRN

## 2012-09-27 MED ORDER — NITROGLYCERIN 0.4 MG SL SUBL: 0.4000 mg | SUBLINGUAL_TABLET | SUBLINGUAL | Status: DC | PRN

## 2012-09-27 MED ORDER — PANTOPRAZOLE SODIUM 40 MG PO TBEC
40.0000 mg | DELAYED_RELEASE_TABLET | Freq: Every morning | ORAL | Status: DC
  Administered 2012-09-27 – 2012-09-29 (×3): 40 mg via ORAL
  Filled 2012-09-27 (×3): qty 1

## 2012-09-27 MED ORDER — LINAGLIPTIN 5 MG PO TABS
5.0000 mg | ORAL_TABLET | Freq: Every day | ORAL | Status: DC
  Administered 2012-09-27 – 2012-09-29 (×3): 5 mg via ORAL
  Filled 2012-09-27 (×3): qty 1

## 2012-09-27 MED ORDER — INSULIN ASPART 100 UNIT/ML ~~LOC~~ SOLN
3.0000 [IU] | Freq: Three times a day (TID) | SUBCUTANEOUS | Status: DC
  Administered 2012-09-27 – 2012-09-29 (×6): 3 [IU] via SUBCUTANEOUS

## 2012-09-27 MED ORDER — ADULT MULTIVITAMIN W/MINERALS CH
1.0000 | ORAL_TABLET | Freq: Every day | ORAL | Status: DC
  Administered 2012-09-27 – 2012-09-29 (×3): 1 via ORAL
  Filled 2012-09-27 (×3): qty 1

## 2012-09-27 MED ORDER — SENNOSIDES-DOCUSATE SODIUM 8.6-50 MG PO TABS
1.0000 | ORAL_TABLET | Freq: Every evening | ORAL | Status: DC | PRN
  Filled 2012-09-27: qty 1

## 2012-09-27 MED ORDER — ALBUTEROL SULFATE (5 MG/ML) 0.5% IN NEBU
5.0000 mg | INHALATION_SOLUTION | Freq: Once | RESPIRATORY_TRACT | Status: AC
  Administered 2012-09-27: 5 mg via RESPIRATORY_TRACT
  Filled 2012-09-27: qty 1

## 2012-09-27 MED ORDER — SODIUM CHLORIDE 0.9 % IJ SOLN
3.0000 mL | Freq: Two times a day (BID) | INTRAMUSCULAR | Status: DC
  Administered 2012-09-27 – 2012-09-28 (×3): 3 mL via INTRAVENOUS

## 2012-09-27 MED ORDER — SODIUM CHLORIDE 0.9 % IV SOLN
20.0000 mL | INTRAVENOUS | Status: DC
  Administered 2012-09-27: 20 mL via INTRAVENOUS

## 2012-09-27 MED ORDER — ONDANSETRON HCL 4 MG PO TABS: 4.0000 mg | ORAL_TABLET | Freq: Four times a day (QID) | ORAL | Status: DC | PRN

## 2012-09-27 MED ORDER — AMLODIPINE BESYLATE 10 MG PO TABS
10.0000 mg | ORAL_TABLET | Freq: Every day | ORAL | Status: DC
  Administered 2012-09-28 – 2012-09-29 (×2): 10 mg via ORAL
  Filled 2012-09-27 (×3): qty 1

## 2012-09-27 MED ORDER — LOSARTAN POTASSIUM 50 MG PO TABS
100.0000 mg | ORAL_TABLET | Freq: Every day | ORAL | Status: DC
Start: 2012-09-27 — End: 2012-09-29
  Administered 2012-09-27 – 2012-09-29 (×3): 100 mg via ORAL
  Filled 2012-09-27 (×3): qty 2

## 2012-09-27 MED ORDER — SODIUM CHLORIDE 0.9 % IJ SOLN
3.0000 mL | Freq: Two times a day (BID) | INTRAMUSCULAR | Status: DC
  Administered 2012-09-27 – 2012-09-29 (×4): 3 mL via INTRAVENOUS

## 2012-09-27 MED ORDER — SODIUM CHLORIDE 0.9 % IJ SOLN: 3.0000 mL | INTRAMUSCULAR | Status: DC | PRN

## 2012-09-27 MED ORDER — LOVASTATIN 20 MG PO TABS
20.0000 mg | ORAL_TABLET | Freq: Every day | ORAL | Status: DC
  Administered 2012-09-27 – 2012-09-28 (×2): 20 mg via ORAL
  Filled 2012-09-27 (×4): qty 1

## 2012-09-27 MED ORDER — DEXTROSE 5 % IV SOLN
500.0000 mg | INTRAVENOUS | Status: DC
  Administered 2012-09-28 – 2012-09-29 (×2): 500 mg via INTRAVENOUS
  Filled 2012-09-27 (×3): qty 500

## 2012-09-27 MED ORDER — NIACIN ER 500 MG PO CPCR
1000.0000 mg | ORAL_CAPSULE | Freq: Every day | ORAL | Status: DC
  Administered 2012-09-27 – 2012-09-28 (×2): 1000 mg via ORAL
  Filled 2012-09-27 (×3): qty 2

## 2012-09-27 MED ORDER — INSULIN ASPART 100 UNIT/ML ~~LOC~~ SOLN
0.0000 [IU] | Freq: Three times a day (TID) | SUBCUTANEOUS | Status: DC
  Administered 2012-09-27: 3 [IU] via SUBCUTANEOUS
  Administered 2012-09-28 (×2): 2 [IU] via SUBCUTANEOUS
  Administered 2012-09-28 – 2012-09-29 (×2): 1 [IU] via SUBCUTANEOUS
  Administered 2012-09-29: 2 [IU] via SUBCUTANEOUS

## 2012-09-27 MED ORDER — FUROSEMIDE 40 MG PO TABS
60.0000 mg | ORAL_TABLET | Freq: Two times a day (BID) | ORAL | Status: DC
  Administered 2012-09-27 – 2012-09-29 (×5): 60 mg via ORAL
  Filled 2012-09-27 (×6): qty 1

## 2012-09-27 MED ORDER — MORPHINE SULFATE 2 MG/ML IJ SOLN
1.0000 mg | INTRAMUSCULAR | Status: DC | PRN
  Filled 2012-09-27: qty 1

## 2012-09-27 MED ORDER — ASPIRIN 81 MG PO CHEW
324.0000 mg | CHEWABLE_TABLET | Freq: Once | ORAL | Status: AC
  Administered 2012-09-27: 324 mg via ORAL
  Filled 2012-09-27: qty 4

## 2012-09-27 MED ORDER — ESCITALOPRAM OXALATE 10 MG PO TABS
10.0000 mg | ORAL_TABLET | Freq: Every day | ORAL | Status: DC
  Administered 2012-09-27 – 2012-09-28 (×2): 10 mg via ORAL
  Filled 2012-09-27 (×4): qty 1

## 2012-09-27 MED ORDER — PREDNISONE 20 MG PO TABS
60.0000 mg | ORAL_TABLET | Freq: Once | ORAL | Status: AC
  Administered 2012-09-27: 60 mg via ORAL
  Filled 2012-09-27: qty 3

## 2012-09-27 MED ORDER — DEXTROSE 5 % IV SOLN
1.0000 g | INTRAVENOUS | Status: DC
  Administered 2012-09-27 – 2012-09-28 (×2): 1 g via INTRAVENOUS
  Filled 2012-09-27 (×3): qty 10

## 2012-09-27 MED ORDER — ACETAMINOPHEN 325 MG PO TABS: 650.0000 mg | ORAL_TABLET | Freq: Four times a day (QID) | ORAL | Status: DC | PRN

## 2012-09-27 MED ORDER — ACETAMINOPHEN 325 MG PO TABS
650.0000 mg | ORAL_TABLET | Freq: Once | ORAL | Status: AC
  Administered 2012-09-27: 650 mg via ORAL
  Filled 2012-09-27: qty 2

## 2012-09-27 MED ORDER — ACETAMINOPHEN 650 MG RE SUPP: 650.0000 mg | Freq: Four times a day (QID) | RECTAL | Status: DC | PRN

## 2012-09-27 MED ORDER — AZITHROMYCIN 250 MG PO TABS
500.0000 mg | ORAL_TABLET | Freq: Once | ORAL | Status: AC
  Administered 2012-09-27: 500 mg via ORAL
  Filled 2012-09-27: qty 2

## 2012-09-27 MED ORDER — SODIUM CHLORIDE 0.9 % IV SOLN: 250.0000 mL | INTRAVENOUS | Status: DC | PRN

## 2012-09-27 MED ORDER — ENOXAPARIN SODIUM 40 MG/0.4ML ~~LOC~~ SOLN
40.0000 mg | SUBCUTANEOUS | Status: DC
  Administered 2012-09-27 – 2012-09-28 (×2): 40 mg via SUBCUTANEOUS
  Filled 2012-09-27 (×4): qty 0.4

## 2012-09-27 MED ORDER — IPRATROPIUM BROMIDE 0.02 % IN SOLN
0.5000 mg | Freq: Once | RESPIRATORY_TRACT | Status: AC
  Administered 2012-09-27: 0.5 mg via RESPIRATORY_TRACT
  Filled 2012-09-27: qty 2.5

## 2012-09-27 NOTE — Progress Notes (Signed)
Dr Ardyth Harps did not return text page. Report given to night RN for follow up.

## 2012-09-27 NOTE — ED Notes (Signed)
Report given to Lawson Fiscal, RN in CDU

## 2012-09-27 NOTE — ED Provider Notes (Addendum)
History     CSN: [id number]  Arrival date & time 09/27/12  0740   First MD Initiated Contact with Patient 09/27/12 0800      Chief Complaint  Patient presents with  . Chest Pain  . Cough    (Consider location/radiation/quality/duration/timing/severity/associated sxs/prior treatment) HPI Comments: Bradley [name] is a 71 y.o. Male who presents for evaluation of chest pain. The chest pain is present for 3 days, it is intermittent. There are no specific provoking factors or alleviating factors. He took a single nitroglycerin, at home, and EMS gave him 2; he continues to have 9/10, chest pain, it is a dull. His pain is intermittent, lasting hours at a time. He has ongoing, chronic shortness of breath. No recent cough, or sputum production. He did not eat this morning. He did not take his morning medications. He has not had nausea, vomiting, fever, chills, back pain, weakness, or dizziness. He saw his electrophysiologist 09/12/12, and was told to take an increased dose of Lasix for 4 days. He does not follow his weights, at home; despite being advised to by his providers. He, states that he has been told to not take aspirin, but does not know why. There are no other modifying factors.  Patient is a 71 y.o. male presenting with chest pain and cough. The history is provided by the patient.  Chest Pain Primary symptoms include cough.    Cough Associated symptoms include chest pain.    Past Medical History  Diagnosis Date  . HTN (hypertension)   . HLD (hyperlipidemia)   . BPH (benign prostatic hyperplasia)     Saw Dr [name] 2004, normal renal u/s  . Leg ulcer 2011  . CHF (congestive heart failure)     Thought primarily to be non-systolic although EF down (EF 40-98% 12/2010, down to 35-40% 09/05/11), cath 2008 with no CAD, nuclear study 07/2011 showing Small area of reversibility in the distal ant/lat wall the left ventricle suspicious for ischemia/septal wall HK but felt to be low risk   (per D/C Summary 07/2011)  . Pleural effusion 2008    S/p decortication  . Insomnia   . Atrial fibrillation     s/p AV node ablation & BiV PPM implantation 09/08/11 (op dictation pending)  . Sinoatrial node dysfunction   . Pulmonary HTN   . Coronary artery disease   . Anginal pain   . Peripheral vascular disease   . Anemia     intermittent  . Pacemaker   . Heart murmur   . Pneumonia     "today and often when I worked in [place]; had it once/yr X 5 yr then" (09/27/2012)  . Shortness of breath     "all the time now" (09/27/2012)  . Type II diabetes mellitus 1999  . Daily headache     "sometimes" (09/27/2012)  . Migraines   . Arthritis     "?back" (09/27/2012)  . On home oxygen therapy     "sleep w/2L q hs" (09/27/2012)    Past Surgical History  Procedure Date  . Pleural scarification   . Lung decortication   . Colonoscopy 03/10/11    normal  . Pneumothorax with fibrothorax ~ 2010  . Insert / replace / remove pacemaker 09/08/11    pacemaker placement  . Tonsillectomy     "as a kid" (09/27/2012)  . Cardiac catheterization     "couple times; never had balloon or stent" (09/27/2012)    Family History  Problem Relation Age of Onset  .  Diabetes Father   . Breast cancer Maternal Aunt   . Heart attack Neg Hx   . Prostate cancer Neg Hx   . Colon cancer Neg Hx   . Cancer Neg Hx     colon or prostate  . Coronary artery disease Father   . Polycythemia Mother     History  Substance Use Topics  . Smoking status: Former Smoker -- 2.5 packs/day for 25 years    Types: Cigarettes    Quit date: 10/11/1980  . Smokeless tobacco: Never Used  . Alcohol Use: Yes     Comment: 09/27/2012 "occasionally once a year; if that"      Review of Systems  Respiratory: Positive for cough.   Cardiovascular: Positive for chest pain.  All other systems reviewed and are negative.    Allergies  Hydrocodone; Tramadol; Tadalafil; and Avelox  Home Medications   No current outpatient  prescriptions on file.  BP 139/82  Pulse 70  Temp 97.1 F (36.2 C) (Oral)  Resp 18  Ht 5\' 10"  (1.778 m)  Wt 211 lb 13.8 oz (96.1 kg)  BMI 30.40 kg/m2  SpO2 96%  Physical Exam  Nursing note and vitals reviewed. Constitutional: He is oriented to person, place, and time. He appears well-developed.       Elderly, frail  HENT:  Head: Normocephalic and atraumatic.  Right Ear: External ear normal.  Left Ear: External ear normal.  Eyes: Conjunctivae normal and EOM are normal. Pupils are equal, round, and reactive to light.  Neck: Normal range of motion and phonation normal. Neck supple.  Cardiovascular: Normal rate, regular rhythm, normal heart sounds and intact distal pulses.   Pulmonary/Chest: Effort normal. He exhibits no bony tenderness.       Diminished, bilaterally, with scattered wheezes. Increased work of breathing, with appearance of self_PEEP; using puffed cheek, at end of inspiration phase.  Abdominal: Soft. Normal appearance. There is no tenderness.  Musculoskeletal: Normal range of motion.  Neurological: He is alert and oriented to person, place, and time. He has normal strength. No cranial nerve deficit or sensory deficit. He exhibits normal muscle tone. Coordination normal.  Skin: Skin is warm, dry and intact.  Psychiatric: He has a normal mood and affect. His behavior is normal. Judgment and thought content normal.    ED Course  Procedures (including critical care time)  Initial assessment: Chest pain, likely to be coronary artery disease, related. More likely, respiratory induced; SIRS  Negative. Mentation, normal.  Emergency department treatment: IV, Rocephin, oral Zithromax, oral prednisone, nebulizer  Repeat vital signs, normal     Date: 07/28/2012  Rate: 70  Rhythm: Ventricular paced  QRS Axis: NA  PR and QT Intervals: NA  ST/T Wave abnormalities: Nonspecific abnormalities  PR and QRS Conduction Disutrbances:  Narrative Interpretation: NA  Old EKG  Reviewed: unchanged  CRITICAL CARE Performed by: Mancel Bale L   Total critical care time: 35 minutes  Critical care time was exclusive of separately billable procedures and treating other patients.  Critical care was necessary to treat or prevent imminent or life-threatening deterioration.  Critical care was time spent personally by me on the following activities: development of treatment plan with patient and/or surrogate as well as nursing, discussions with consultants, evaluation of patient's response to treatment, examination of patient, obtaining history from patient or surrogate, ordering and performing treatments and interventions, ordering and review of laboratory studies, ordering and review of radiographic studies, pulse oximetry and re-evaluation of patient's condition.  Labs Reviewed  GLUCOSE,  CAPILLARY - Abnormal; Notable for the following:    Glucose-Capillary 150 (*)     All other components within normal limits  CBC - Abnormal; Notable for the following:    WBC 12.1 (*)     RBC 4.17 (*)     Hemoglobin 12.9 (*)     HCT 38.1 (*)     All other components within normal limits  COMPREHENSIVE METABOLIC PANEL - Abnormal; Notable for the following:    Glucose, Bld 157 (*)     Albumin 3.3 (*)     GFR calc non Af Amer 84 (*)     All other components within normal limits  PRO B NATRIURETIC PEPTIDE - Abnormal; Notable for the following:    Pro B Natriuretic peptide (BNP) 1608.0 (*)     All other components within normal limits  CREATININE, SERUM - Abnormal; Notable for the following:    GFR calc non Af Amer 86 (*)     All other components within normal limits  HEMOGLOBIN A1C - Abnormal; Notable for the following:    Hemoglobin A1C 5.7 (*)     Mean Plasma Glucose 117 (*)     All other components within normal limits  GLUCOSE, CAPILLARY - Abnormal; Notable for the following:    Glucose-Capillary 240 (*)     All other components within normal limits  BASIC METABOLIC  PANEL - Abnormal; Notable for the following:    Glucose, Bld 190 (*)     All other components within normal limits  CBC - Abnormal; Notable for the following:    HCT 38.6 (*)     All other components within normal limits  GLUCOSE, CAPILLARY - Abnormal; Notable for the following:    Glucose-Capillary 138 (*)     All other components within normal limits  POCT I-STAT TROPONIN I  CULTURE, EXPECTORATED SPUTUM-ASSESSMENT  CULTURE, RESPIRATORY  TROPONIN I  TROPONIN I  TROPONIN I  CBC  HIV ANTIBODY (ROUTINE TESTING)  STREP PNEUMONIAE URINARY ANTIGEN  GRAM STAIN  LEGIONELLA ANTIGEN, URINE  INFLUENZA PANEL BY PCR  PRO B NATRIURETIC PEPTIDE   Dg Chest Portable 1 View  09/27/2012  *RADIOLOGY REPORT*  Clinical Data: Chest pain, cough  PORTABLE CHEST - 1 VIEW  Comparison: 07/23/2012  Findings: Cardiomegaly again noted.  Dual lead cardiac pacemaker is unchanged in position.  There is hazy perihilar and bilateral lower lobe airspace disease which may be due to pulmonary edema or bilateral infiltrate.  Clinical correlation is necessary.  IMPRESSION: Cardiomegaly again noted.  Stable dual lead cardiac pacemaker position.  Hazy perihilar and bilateral lower lobe airspace disease may be due to bilateral infiltrates or evolving pulmonary edema. Clinical correlation is necessary.   Original Report Authenticated By: Natasha Mead, M.D.    Nursing notes, applicable records and vitals reviewed.  Radiologic Images/Reports reviewed.   1. Chest pain   2. Community acquired pneumonia   3. COPD exacerbation   4. Atrial fibrillation   5. CAP (community acquired pneumonia)   6. Chronic combined systolic and diastolic CHF (congestive heart failure)   7. Diabetes mellitus   8. HTN (hypertension)       MDM  Chest pain, atypical, for cardiac disease. Initial evaluation, does not indicate ACS. Likely, pneumonia, SIRS negative, and sepsis, unlikely. Patient is debilitated and will need observation, , to ensure  improvement, prior to being discharged, from the ED, or hospital setting. He is stable for admission to a telemetry unit.   Plan: Admit- [facility]  Flint Melter, MD 09/28/12 1610  Flint Melter, MD 09/29/12 [phone]

## 2012-09-27 NOTE — Progress Notes (Signed)
[facility]          History and Physical    PCP:   [name], MD   Chief Complaint:  Chest pain  HPI: 71 y/o man with h/o CAD, combined CHF, a fib s/p ablation and PPM off of anticoagulation 2/2 recent intracranial bleed, DM and HTN. Presents to the hospital with complaints of CP that began this morning while in bed, non-radiating, not relieved by nitroglycerin and associated with a productive cough. He had an admission to the hospital in October for CP as well, and this was deemed to be non-cardiac in origen. He recently saw his cardiologist, Dr. Ladona Ridgel on 12/3 who up titrated his lasix for a few days due to some weight gain that was attributed to dietary indiscretion over the Thanksgiving Holiday. We have been asked to admit him for further evaluation and management.  Allergies:   Allergies  Allergen Reactions  . Avelox (Moxifloxacin Hydrochloride) Itching    Headache  . Tadalafil     headache, backache      Past Medical History  Diagnosis Date  . HTN (hypertension)   . HLD (hyperlipidemia)   . DM (diabetes mellitus) 1999    adult onset  . BPH (benign prostatic hyperplasia)     Saw Dr [name] 2004, normal renal u/s  . Leg ulcer 2011  . CHF (congestive heart failure)     Thought primarily to be non-systolic although EF down (EF 40-98% 12/2010, down to 35-40% 09/05/11), cath 2008 with no CAD, nuclear study 07/2011 showing Small area of reversibility in the distal ant/lat wall the left ventricle suspicious for ischemia/septal wall HK but felt to be low risk  (per D/C Summary 07/2011)  . Pleural effusion 2008    S/p decortication  . Insomnia   . Atrial fibrillation     s/p AV node ablation & BiV PPM implantation 09/08/11 (op dictation pending)  . Sinoatrial node dysfunction   . Headache   . Pulmonary HTN   . Coronary artery disease   . Anginal pain   . Shortness of breath   . Peripheral vascular disease   . Pneumonia   . Arthritis   . Anemia    intermittent    Past Surgical History  Procedure Date  . Pleural scarification   . Lung decortication   . Colonoscopy 03/10/11    normal  . Pneumothorax with fibrothorax   . Insert / replace / remove pacemaker 09/08/11    pacemaker placement  . Tonsillectomy     Prior to Admission medications   Medication Sig Start Date End Date Taking? Authorizing Provider  amLODipine (NORVASC) 10 MG tablet Take 1 tablet (10 mg total) by mouth daily. 07/10/12  Yes Laveda Norman, MD  carvedilol (COREG) 25 MG tablet Take 1.5 tablets (37.5 mg total) by mouth 2 (two) times daily with a meal. 05/08/12 05/08/13 Yes [name], MD  escitalopram (LEXAPRO) 10 MG tablet Take 1 tablet (10 mg total) by mouth at bedtime. 07/10/12  Yes Laveda Norman, MD  furosemide (LASIX) 40 MG tablet Take 1.5 tablets (60 mg total) by mouth 2 (two) times daily. 05/08/12  Yes [name], MD  glyBURIDE (DIABETA) 5 MG tablet Take 5 mg by mouth daily. 07/26/12  Yes Lesle Chris Black, NP  losartan (COZAAR) 100 MG tablet Take 1 tablet (100 mg total) by mouth daily. 05/08/12  Yes [name], MD  Multiple Vitamins-Minerals (MULTIVITAMINS THER. W/MINERALS) TABS Take 1 tablet by mouth daily.  Yes Historical Provider, MD  niacin-lovastatin (ADVICOR) 1000-20 MG 24 hr tablet Take 1 tablet by mouth at bedtime. 05/08/12  Yes [name], MD  nitroGLYCERIN (NITROSTAT) 0.4 MG SL tablet Place 1 tablet (0.4 mg total) under the tongue every 5 (five) minutes as needed for chest pain. 07/10/12  Yes Laveda Norman, MD  pantoprazole (PROTONIX) 40 MG tablet Take 40 mg by mouth every morning.   Yes Historical Provider, MD  sitaGLIPtin (JANUVIA) 100 MG tablet Take 1 tablet (100 mg total) by mouth daily. 05/08/12  Yes [name], MD  zolpidem (AMBIEN) 10 MG tablet Take 1 tablet (10 mg total) by mouth at bedtime as needed. For sleep 05/08/12  Yes [name], MD    Social History:  reports that he quit smoking about 31 years ago. His smoking use included Cigarettes. He has a  62.5 pack-year smoking history. He has never used smokeless tobacco. He reports that he drinks alcohol. He reports that he does not use illicit drugs.  Family History  Problem Relation Age of Onset  . Diabetes Father   . Breast cancer Maternal Aunt   . Heart attack Neg Hx   . Prostate cancer Neg Hx   . Colon cancer Neg Hx   . Cancer Neg Hx     colon or prostate  . Coronary artery disease Father   . Polycythemia Mother     Review of Systems:  Constitutional: Denies fever, chills, diaphoresis, appetite change and fatigue.  HEENT: Denies photophobia, eye pain, redness, hearing loss, ear pain, congestion, sore throat, rhinorrhea, sneezing, mouth sores, trouble swallowing, neck pain, neck stiffness and tinnitus.   Respiratory: Denies wheezing.   Cardiovascular: Denies  palpitations and leg swelling.  Gastrointestinal: Denies nausea, vomiting, abdominal pain, diarrhea, constipation, blood in stool and abdominal distention.  Genitourinary: Denies dysuria, urgency, frequency, hematuria, flank pain and difficulty urinating.  Musculoskeletal: Denies myalgias, back pain, joint swelling, arthralgias and gait problem.  Skin: Denies pallor, rash and wound.  Neurological: Denies dizziness, seizures, syncope, weakness, light-headedness, numbness and headaches.  Hematological: Denies adenopathy. Easy bruising, personal or family bleeding history  Psychiatric/Behavioral: Denies suicidal ideation, mood changes, confusion, nervousness, sleep disturbance and agitation   Physical Exam: Blood pressure 118/66, pulse 73, temperature 98.1 F (36.7 C), temperature source Oral, resp. rate 24, weight 95.255 kg (210 lb), SpO2 95.00%. Gen: AA Ox3, NAD. No current CP. HEENT: [place]/AT/PERRLA, moist mucous membranes. Neck: supple, no JVD, no LAD, no bruits, no goiter. CV: RRR, no M/R/G. Lungs: CTA B. No wheezes. Abd: S/NT/ND/+BS/no masses or organomegaly noted. Ext: no C/C//E, +pedal pulses. Neuro: grossly intact  and non-focal.  Labs on Admission:  Results for orders placed during the hospital encounter of 09/27/12 (from the past 48 hour(s))  GLUCOSE, CAPILLARY     Status: Abnormal   Collection Time   09/27/12  7:58 AM      Component Value Range Comment   Glucose-Capillary 150 (*) 70 - 99 mg/dL   CBC     Status: Abnormal   Collection Time   09/27/12  8:34 AM      Component Value Range Comment   WBC 12.1 (*) 4.0 - 10.5 K/uL    RBC 4.17 (*) 4.22 - 5.81 MIL/uL    Hemoglobin 12.9 (*) 13.0 - 17.0 g/dL    HCT 16.1 (*) 09.6 - 52.0 %    MCV 91.4  78.0 - 100.0 fL    MCH 30.9  26.0 - 34.0 pg  MCHC 33.9  30.0 - 36.0 g/dL    RDW 16.1  09.6 - 04.5 %    Platelets 171  150 - 400 K/uL   COMPREHENSIVE METABOLIC PANEL     Status: Abnormal   Collection Time   09/27/12  8:34 AM      Component Value Range Comment   Sodium 137  135 - 145 mEq/L    Potassium 3.7  3.5 - 5.1 mEq/L    Chloride 99  96 - 112 mEq/L    CO2 28  19 - 32 mEq/L    Glucose, Bld 157 (*) 70 - 99 mg/dL    BUN 14  6 - 23 mg/dL    Creatinine, Ser 4.09  0.50 - 1.35 mg/dL    Calcium 8.8  8.4 - 81.1 mg/dL    Total Protein 7.1  6.0 - 8.3 g/dL    Albumin 3.3 (*) 3.5 - 5.2 g/dL    AST 16  0 - 37 U/L    ALT 12  0 - 53 U/L    Alkaline Phosphatase 83  39 - 117 U/L    Total Bilirubin 1.1  0.3 - 1.2 mg/dL    GFR calc non Af Amer 84 (*) >90 mL/min    GFR calc Af Amer >90  >90 mL/min   POCT I-STAT TROPONIN I     Status: Normal   Collection Time   09/27/12  8:56 AM      Component Value Range Comment   Troponin i, poc 0.00  0.00 - 0.08 ng/mL    Comment 3              Radiological Exams on Admission: Dg Chest Portable 1 View  09/27/2012  *RADIOLOGY REPORT*  Clinical Data: Chest pain, cough  PORTABLE CHEST - 1 VIEW  Comparison: 07/23/2012  Findings: Cardiomegaly again noted.  Dual lead cardiac pacemaker is unchanged in position.  There is hazy perihilar and bilateral lower lobe airspace disease which may be due to pulmonary edema or bilateral  infiltrate.  Clinical correlation is necessary.  IMPRESSION: Cardiomegaly again noted.  Stable dual lead cardiac pacemaker position.  Hazy perihilar and bilateral lower lobe airspace disease may be due to bilateral infiltrates or evolving pulmonary edema. Clinical correlation is necessary.   Original Report Authenticated By: Natasha Mead, M.D.     Assessment/Plan Principal Problem:  *Chest pain Active Problems:  HYPERLIPIDEMIA  HYPERTENSION  Diabetes mellitus  CAP (community acquired pneumonia)  Chronic combined systolic and diastolic CHF (congestive heart failure)   Chest Pain -Doubt cardiac in origen. -First troponin is negative, EKG non-acute, no response to nitroglycerin. -Likely related to PNA.  CAP -Has been started on rocephin and azithro, which I will continue. -Get Blood/sputum cx. -Strep pneumo/Legionella urine antigens.  DM -Check A1c. -SSI plus continue oral hypoglycemic agents.  HTN -Well controlled.  Chronic Combined CHF -No signs of acute exacerbation. -Check BNP. -Saline lock. -Continue home dose of lasix (60 mg BID).  DVT Prophylaxis -Lovenox.  Disposition -Admit for IV antibiotics for PNA and CP rule out.   Time Spent on Admission: 75 minutes.  Chaya Jan [facility] Pager: [phone] 09/27/2012, 11:56 AM

## 2012-09-27 NOTE — Progress Notes (Signed)
ANTIBIOTIC CONSULT NOTE - INITIAL  Pharmacy Consult for may adjust antibiotics for renal function (on azithromycin and ceftriaxone) Indication: CAP  Allergies  Allergen Reactions  . Tadalafil Other (See Comments)    headache, backache  . Avelox (Moxifloxacin Hydrochloride) Itching and Other (See Comments)    Headache    Patient Measurements: Height: 5\' 10"  (177.8 cm) Weight: 214 lb 4.6 oz (97.2 kg) IBW/kg (Calculated) : 73  Adjusted Body Weight: n/a  Vital Signs: Temp: 97.6 F (36.4 C) (12/18 1443) Temp src: Oral (12/18 1443) BP: 130/71 mmHg (12/18 1443) Pulse Rate: 73  (12/18 1443) Intake/Output from previous day:   Intake/Output from this shift:    Labs:  [facility] 09/27/12 0834  WBC 12.1*  HGB 12.9*  PLT 171  LABCREA --  CREATININE 0.89   Estimated Creatinine Clearance: 89 ml/min (by C-G formula based on Cr of 0.89). No results found for this basename: VANCOTROUGH:2,VANCOPEAK:2,VANCORANDOM:2,GENTTROUGH:2,GENTPEAK:2,GENTRANDOM:2,TOBRATROUGH:2,TOBRAPEAK:2,TOBRARND:2,AMIKACINPEAK:2,AMIKACINTROU:2,AMIKACIN:2, in the last 72 hours   Microbiology: Recent Results (from the past 720 hour(s))  CULTURE, EXPECTORATED SPUTUM-ASSESSMENT     Status: Normal   Collection Time   09/27/12  1:57 PM      Component Value Range Status Comment   Specimen Description SPUTUM   Final    Special Requests NONE   Final    Sputum evaluation     Final    Value: THIS SPECIMEN IS ACCEPTABLE. RESPIRATORY CULTURE REPORT TO FOLLOW.   Report Status 09/27/2012 FINAL   Final     Medical History: Past Medical History  Diagnosis Date  . HTN (hypertension)   . HLD (hyperlipidemia)   . BPH (benign prostatic hyperplasia)     Saw Dr [name] 2004, normal renal u/s  . Leg ulcer 2011  . CHF (congestive heart failure)     Thought primarily to be non-systolic although EF down (EF 16-10% 12/2010, down to 35-40% 09/05/11), cath 2008 with no CAD, nuclear study 07/2011 showing Small area of  reversibility in the distal ant/lat wall the left ventricle suspicious for ischemia/septal wall HK but felt to be low risk  (per D/C Summary 07/2011)  . Pleural effusion 2008    S/p decortication  . Insomnia   . Atrial fibrillation     s/p AV node ablation & BiV PPM implantation 09/08/11 (op dictation pending)  . Sinoatrial node dysfunction   . Pulmonary HTN   . Coronary artery disease   . Anginal pain   . Peripheral vascular disease   . Anemia     intermittent  . Pacemaker   . Heart murmur   . Pneumonia     "today and often when I worked in [place]; had it once/yr X 5 yr then" (09/27/2012)  . Shortness of breath     "all the time now" (09/27/2012)  . Type II diabetes mellitus 1999  . Daily headache     "sometimes" (09/27/2012)  . Migraines   . Arthritis     "?back" (09/27/2012)  . On home oxygen therapy     "sleep w/2L q hs" (09/27/2012)    Medications:  Scheduled:    . [COMPLETED] acetaminophen  650 mg Oral Once  . [COMPLETED] albuterol  5 mg Nebulization Once  . amLODipine  10 mg Oral Daily  . [COMPLETED] aspirin  324 mg Oral Once  . azithromycin  500 mg Intravenous Q24H  . [COMPLETED] azithromycin  500 mg Oral Once  . carvedilol  37.5 mg Oral BID WC  . [COMPLETED] cefTRIAXone (ROCEPHIN) IVPB 1 gram/50 mL D5W  1 g Intravenous Once  . cefTRIAXone (ROCEPHIN)  IV  1 g Intravenous Q24H  . enoxaparin (LOVENOX) injection  40 mg Subcutaneous Q24H  . escitalopram  10 mg Oral QHS  . furosemide  60 mg Oral BID  . glyBURIDE  5 mg Oral QAC breakfast  . insulin aspart  0-9 Units Subcutaneous TID WC  . insulin aspart  3 Units Subcutaneous TID WC  . [COMPLETED] ipratropium  0.5 mg Nebulization Once  . linagliptin  5 mg Oral Daily  . losartan  100 mg Oral Daily  . niacin  1,000 mg Oral QHS   And  . lovastatin  20 mg Oral QHS  . multivitamin with minerals  1 tablet Oral Daily  . pantoprazole  40 mg Oral q morning - 10a  . [COMPLETED] predniSONE  60 mg Oral Once  . sodium chloride   3 mL Intravenous Q12H  . sodium chloride  3 mL Intravenous Q12H  . [DISCONTINUED] niacin-lovastatin  1 tablet Oral QHS   Assessment: 71 yo male on azithromycin 500 mg daily and ceftriaxone 1 g q 24 hrs.  Est. CrCl ~ 90 ml/min.  Doses of both antibiotics appropriate for given renal function.  Goal of Therapy:  resolution of infection  Plan:  1. Continue antibiotics at current doses. 2. Will follow up for LOT of antibiotics.  Reece Leader, Pharm D 09/27/2012 3:17 PM    Jocabed Cheese, Gwenlyn Found 09/27/2012,3:10 PM

## 2012-09-27 NOTE — ED Notes (Addendum)
Pt states he has been having rt sided chest pain sharp in nature rates pain a 8/10. Pt states he has had a cough for 2-3days and has been coughing up green sputum. States his chest pain gets worse when he coughs. Pt took 1 nitro prior to arrival. Pt also has a visible bruise to the left side of his forehead.

## 2012-09-27 NOTE — Progress Notes (Signed)
Pt admitted to room 4742 per stretcher. Alert/oriented accompanied b y wife and ED Placed on Telemetry monitor and oriented to room. Made aware of safety policy

## 2012-09-27 NOTE — ED Notes (Signed)
CBG completed: 150

## 2012-09-27 NOTE — Progress Notes (Signed)
Family says that pt had been given Oxycodone on another admission and did not do well like he was having withdrawals. Prefer that pt not be prescribed Oxy or vicoding for pain. Family asking for tramadol. Call to Dr Ardyth Harps

## 2012-09-27 NOTE — ED Notes (Signed)
Per [facility], pt has a productive cough for 3 days and started having sharp left sided cp yesterday with cough just below left breast area. Pt is warm to touch. Has had 3 NTG 1 at home prior to EMS arrival. Rating pain 2/10. Pt did fall yesterday and has a knot to head and has recently been taken off blood thinners. Sputum is yellow in nature.  20g to [facility]. Does not take ASA because MD told him not to.

## 2012-09-28 LAB — LEGIONELLA ANTIGEN, URINE: Legionella Antigen, Urine: NEGATIVE

## 2012-09-28 LAB — CBC
HCT: 38.6 % — ABNORMAL LOW (ref 39.0–52.0)
Hemoglobin: 13.6 g/dL (ref 13.0–17.0)
MCH: 31.9 pg (ref 26.0–34.0)
MCHC: 35.2 g/dL (ref 30.0–36.0)
MCV: 90.6 fL (ref 78.0–100.0)
Platelets: 167 10*3/uL (ref 150–400)
RBC: 4.26 MIL/uL (ref 4.22–5.81)
RDW: 13.4 % (ref 11.5–15.5)
WBC: 8.9 10*3/uL (ref 4.0–10.5)

## 2012-09-28 LAB — GLUCOSE, CAPILLARY
Glucose-Capillary: 138 mg/dL — ABNORMAL HIGH (ref 70–99)
Glucose-Capillary: 169 mg/dL — ABNORMAL HIGH (ref 70–99)
Glucose-Capillary: 161 mg/dL — ABNORMAL HIGH (ref 70–99)
Glucose-Capillary: 146 mg/dL — ABNORMAL HIGH (ref 70–99)

## 2012-09-28 LAB — HEMOGLOBIN A1C
Hgb A1c MFr Bld: 5.7 % — ABNORMAL HIGH (ref <5.7)
Mean Plasma Glucose: 117 mg/dL — ABNORMAL HIGH (ref <117)

## 2012-09-28 LAB — BASIC METABOLIC PANEL
BUN: 17 mg/dL (ref 6–23)
CO2: 27 mEq/L (ref 19–32)
Calcium: 9.1 mg/dL (ref 8.4–10.5)
Chloride: 102 mEq/L (ref 96–112)
Creatinine, Ser: 0.73 mg/dL (ref 0.50–1.35)
GFR calc Af Amer: >90 mL/min (ref >90)
GFR calc non Af Amer: >90 mL/min (ref >90)
Glucose, Bld: 190 mg/dL — ABNORMAL HIGH (ref 70–99)
Potassium: 4.1 mEq/L (ref 3.5–5.1)
Sodium: 138 mEq/L (ref 135–145)

## 2012-09-28 LAB — PRO B NATRIURETIC PEPTIDE: Pro B Natriuretic peptide (BNP): 1716 pg/mL — ABNORMAL HIGH (ref 0–125)

## 2012-09-28 LAB — INFLUENZA PANEL BY PCR (TYPE A & B)
H1N1 flu by pcr: NOT DETECTED
Influenza A By PCR: NEGATIVE
Influenza B By PCR: NEGATIVE

## 2012-09-28 LAB — TROPONIN I: Troponin I: <0.3 ng/mL (ref <0.30)

## 2012-09-28 MED ORDER — IBUPROFEN 600 MG PO TABS
600.0000 mg | ORAL_TABLET | Freq: Three times a day (TID) | ORAL | Status: DC
  Administered 2012-09-28 – 2012-09-29 (×4): 600 mg via ORAL
  Filled 2012-09-28 (×7): qty 1

## 2012-09-28 MED ORDER — TRAMADOL HCL 50 MG PO TABS
50.0000 mg | ORAL_TABLET | Freq: Four times a day (QID) | ORAL | Status: DC | PRN
  Administered 2012-09-28 – 2012-09-29 (×2): 50 mg via ORAL
  Filled 2012-09-28 (×2): qty 1

## 2012-09-28 MED ORDER — GUAIFENESIN ER 600 MG PO TB12
1200.0000 mg | ORAL_TABLET | Freq: Two times a day (BID) | ORAL | Status: DC
  Administered 2012-09-28 – 2012-09-29 (×2): 1200 mg via ORAL
  Filled 2012-09-28 (×4): qty 2

## 2012-09-28 NOTE — Progress Notes (Signed)
Patient evaluated for community based chronic disease management services with [facility] Care Management Program as a benefit of patient's [employer].  Spoke with patient at bedside to explain [facility] Care Management services. Patient feels that he would not benefit from services at this time, but was very appreciative of the consultation.  Left contact information and [facility] literature at bedside. Of note, [facility] Care Management services does not replace or interfere with any services that are arranged by inpatient case management or social work.  For additional questions or referrals please contact Anibal Henderson BSN RN [facility] Liaison at [phone].

## 2012-09-28 NOTE — Evaluation (Signed)
Physical Therapy Evaluation Patient Details Name: Bradley [name] MRN: [id number] DOB: 1940-11-18 Today's Date: 09/28/2012 Time: 7829-5621 PT Time Calculation (min): 13 min  PT Assessment / Plan / Recommendation Clinical Impression  Pt admitted with atypical CP and demonstrates baseline functional mobility. Pt 95% on 2L at rest and dropped to 83% on RA with ambulation and stairs but returned to 90% on RA with breathing cues and standing rest and back to 94% on 2L end of session. Pt educated for need to maintain oxygen with all mobility secondary to decreased sats. Pt , son and wife all educated for need to continue mobility with energy conservation in mind and O2. No further therapy needs at this time.     PT Assessment  Patent does not need any further PT services    Follow Up Recommendations  No PT follow up    Does the patient have the potential to tolerate intense rehabilitation      Barriers to Discharge        Equipment Recommendations  None recommended by PT    Recommendations for Other Services     Frequency      Precautions / Restrictions     Pertinent Vitals/Pain 75 HR, 95% on 2L No pain      Mobility  Bed Mobility Bed Mobility: Not assessed Transfers Transfers: Sit to Stand;Stand to Sit Sit to Stand: 7: Independent;From bed Stand to Sit: 7: Independent;To bed Ambulation/Gait Ambulation/Gait Assistance: 7: Independent Ambulation Distance (Feet): 350 Feet Assistive device: None Gait Pattern: Within Functional Limits Gait velocity: WFL Stairs: Yes Stairs Assistance: 6: Modified independent (Device/Increase time) Stair Management Technique: One rail Left Number of Stairs: 11     Shoulder Instructions     Exercises     PT Diagnosis:    PT Problem List:   PT Treatment Interventions:     PT Goals    Visit Information  Last PT Received On: 09/28/12 Assistance Needed: +1    Subjective Data  Subjective: I am ready to go Patient Stated Goal: return  home to dog   Prior Functioning  Home Living Lives With: Spouse;Son Available Help at Discharge: Family;Available 24 hours/day Type of Home: House Home Access: Stairs to enter [employer] of Steps: 1 Home Layout: Multi-level;Bed/bath upstairs Bathroom Shower/Tub: Engineer, manufacturing systems: Standard Home Adaptive Equipment: Environmental consultant - rolling;Straight cane Prior Function Level of Independence: Independent Able to Take Stairs?: Yes Driving: No Vocation: Retired Musician: No difficulties    Cognition  Overall Cognitive Status: Appears within functional limits for tasks assessed/performed Arousal/Alertness: Awake/alert Orientation Level: Appears intact for tasks assessed Behavior During Session: [facility] for tasks performed    Extremity/Trunk Assessment Right Upper Extremity Assessment RUE ROM/Strength/Tone: Within functional levels Left Upper Extremity Assessment LUE ROM/Strength/Tone: Within functional levels Right Lower Extremity Assessment RLE ROM/Strength/Tone: Within functional levels Left Lower Extremity Assessment LLE ROM/Strength/Tone: Within functional levels Trunk Assessment Trunk Assessment: Normal   Balance    End of Session PT - End of Session Activity Tolerance: Patient tolerated treatment well Patient left: in bed;with call bell/phone within reach;with family/visitor present Nurse Communication: Mobility status       Delorse Lek 09/28/2012, 4:10 PM  Delaney Meigs, PT [phone]

## 2012-09-28 NOTE — Progress Notes (Signed)
[facility] PROGRESS NOTE  Bradley [name] WUJ:[id number] DOB: Nov 27, 1940 DOA: 09/27/2012 PCP: [name], MD  Assessment/Plan:  #1 atypical chest pain Likely secondary to musculoskeletal as patient's chest wall is tender to palpation versus secondary to pneumonia. Patient denies any shortness of breath. Patient troponins were negative. We'll place empirically on scheduled ibuprofen for now. Tramadol as needed. Continue empiric IV antibiotics for pneumonia.  #2 community-acquired pneumonia Sputum Gram stain and cultures are pending. Urine Legionella antigen is negative. Urine showed pneumococcus antigen is negative. Continue empiric IV Rocephin and azithromycin. Next. Follow.  #3  Well-controlled diabetes mellitus Hemoglobin A1c is 5.7. CBGs have ranged from 157-190. Continue sliding scale insulin. Continue oral hypoglycemic agents.  #4 hypertension Stable. Continue Norvasc, Coreg, Cozaar, Lasix.  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 Patient looks compensated. Patient with no shortness of breath. Patient with no signs of volume overload on clinical exam. Continue Coreg, Lasix, Cozaar.  #6 hyperlipidemia Continue statin.  #7 prophylaxis Lovenox for DVT prophylaxis. Protonix for GI prophylaxis.   Code Status: Full Family Communication: Updated patient at bedside. No family present. Disposition Plan: Home when medically stable in 1-2 days   Consultants:  None  Procedures:  Chest x-ray 09/27/2012    Antibiotics:  IV Rocephin 09/27/2012  IV azithromycin 09/27/2012  HPI/Subjective: Patient states only has chest pain when coughing. Patient states has been a small left decided on the lateral side. Patient denies any shortness of breath.  Objective: Filed Vitals:   09/27/12 2116 09/28/12 0132 09/28/12 0549 09/28/12 1019  BP: 121/66 130/79 139/82 133/67  Pulse: 70 70 70 70  Temp: 97.6 F (36.4 C) 97.8 F (36.6 C) 97.1 F (36.2 C)   TempSrc: Oral  Oral Oral   Resp: 20 18 18    Height:      Weight:   96.1 kg (211 lb 13.8 oz)   SpO2: 95% 96% 96%     Intake/Output Summary (Last 24 hours) at 09/28/12 1110 Last data filed at 09/28/12 1021  Gross per 24 hour  Intake    932 ml  Output    525 ml  Net    407 ml   Filed Weights   09/27/12 0855 09/27/12 1443 09/28/12 0549  Weight: 95.255 kg (210 lb) 97.2 kg (214 lb 4.6 oz) 96.1 kg (211 lb 13.8 oz)    Exam:   General:  NAD  Cardiovascular: RRR with no murmurs rubs or gallops. No lower extremity edema. No JVD. Left chest wall tender to palpation.  Respiratory: Clear to auscultation bilaterally. No wheezing.  Abdomen: Soft, nontender, nondistended, positive bowel sounds.  Data Reviewed: Basic Metabolic Panel:  Lab 09/28/12 9562 09/27/12 1527 09/27/12 0834  NA 138 -- 137  K 4.1 -- 3.7  CL 102 -- 99  CO2 27 -- 28  GLUCOSE 190* -- 157*  BUN 17 -- 14  CREATININE 0.73 0.85 0.89  CALCIUM 9.1 -- 8.8  MG -- -- --  PHOS -- -- --   Liver Function Tests:  Lab 09/27/12 0834  AST 16  ALT 12  ALKPHOS 83  BILITOT 1.1  PROT 7.1  ALBUMIN 3.3*   No results found for this basename: LIPASE:5,AMYLASE:5 in the last 168 hours No results found for this basename: AMMONIA:5 in the last 168 hours CBC:  Lab 09/28/12 0227 09/27/12 1527 09/27/12 0834  WBC 8.9 9.7 12.1*  NEUTROABS -- -- --  HGB 13.6 13.5 12.9*  HCT 38.6* 39.3 38.1*  MCV 90.6 92.3 91.4  PLT  167 152 171   Cardiac Enzymes:  Lab 09/28/12 0227 09/27/12 2011 09/27/12 1527  CKTOTAL -- -- --  CKMB -- -- --  CKMBINDEX -- -- --  TROPONINI <0.30 <0.30 <0.30   BNP (last 3 results)  Basename 09/28/12 0227 09/27/12 1527 07/24/12 0540  PROBNP 1716.0* 1608.0* 345.1*   CBG:  Lab 09/28/12 0558 09/27/12 1609 09/27/12 0758  GLUCAP 138* 240* 150*    Recent Results (from the past 240 hour(s))  CULTURE, EXPECTORATED SPUTUM-ASSESSMENT     Status: Normal   Collection Time   09/27/12  1:57 PM      Component Value Range  Status Comment   Specimen Description SPUTUM   Final    Special Requests NONE   Final    Sputum evaluation     Final    Value: THIS SPECIMEN IS ACCEPTABLE. RESPIRATORY CULTURE REPORT TO FOLLOW.   Report Status 09/27/2012 FINAL   Final   CULTURE, RESPIRATORY     Status: Normal (Preliminary result)   Collection Time   09/27/12  1:57 PM      Component Value Range Status Comment   Specimen Description SPUTUM   Final    Special Requests NONE   Final    Gram Stain     Final    Value: ABUNDANT WBC PRESENT,BOTH PMN AND MONONUCLEAR     FEW SQUAMOUS EPITHELIAL CELLS PRESENT     FEW GRAM POSITIVE RODS     FEW GRAM POSITIVE COCCI IN CLUSTERS     IN PAIRS IN CHAINS RARE GRAM NEGATIVE RODS   Culture Culture reincubated for better growth   Final    Report Status PENDING   Incomplete      Studies: Dg Chest Portable 1 View  09/27/2012  *RADIOLOGY REPORT*  Clinical Data: Chest pain, cough  PORTABLE CHEST - 1 VIEW  Comparison: 07/23/2012  Findings: Cardiomegaly again noted.  Dual lead cardiac pacemaker is unchanged in position.  There is hazy perihilar and bilateral lower lobe airspace disease which may be due to pulmonary edema or bilateral infiltrate.  Clinical correlation is necessary.  IMPRESSION: Cardiomegaly again noted.  Stable dual lead cardiac pacemaker position.  Hazy perihilar and bilateral lower lobe airspace disease may be due to bilateral infiltrates or evolving pulmonary edema. Clinical correlation is necessary.   Original Report Authenticated By: Natasha Mead, M.D.     Scheduled Meds:   . amLODipine  10 mg Oral Daily  . azithromycin  500 mg Intravenous Q24H  . carvedilol  37.5 mg Oral BID WC  . cefTRIAXone (ROCEPHIN)  IV  1 g Intravenous Q24H  . enoxaparin (LOVENOX) injection  40 mg Subcutaneous Q24H  . escitalopram  10 mg Oral QHS  . furosemide  60 mg Oral BID  . glyBURIDE  5 mg Oral QAC breakfast  . insulin aspart  0-9 Units Subcutaneous TID WC  . insulin aspart  3 Units  Subcutaneous TID WC  . linagliptin  5 mg Oral Daily  . losartan  100 mg Oral Daily  . niacin  1,000 mg Oral QHS   And  . lovastatin  20 mg Oral QHS  . multivitamin with minerals  1 tablet Oral Daily  . pantoprazole  40 mg Oral q morning - 10a  . sodium chloride  3 mL Intravenous Q12H  . sodium chloride  3 mL Intravenous Q12H   Continuous Infusions:   Principal Problem:  *Chest pain Active Problems:  HYPERLIPIDEMIA  HYPERTENSION  Diabetes mellitus  CAP (community acquired  pneumonia)  Chronic combined systolic and diastolic CHF (congestive heart failure)    Time spent: Greater than 35 minutes    [facility]  [facility] Pager [phone]. If 8PM-8AM, please contact night-coverage at [email or web address], password [facility] 09/28/2012, 11:10 AM  LOS: 1 day

## 2012-09-28 NOTE — Progress Notes (Signed)
Occupational Therapy Note  OT order received and appreciated.  Per PT report, pt has no acute OT needs at this time. Will sign off. Please re-order if pt experiences functional decline.   09/28/2012 Cipriano Mile OTR/L Pager [phone] Office [phone]

## 2012-09-29 LAB — CBC
HCT: 37.4 % — ABNORMAL LOW (ref 39.0–52.0)
Hemoglobin: 12.6 g/dL — ABNORMAL LOW (ref 13.0–17.0)
MCH: 30.8 pg (ref 26.0–34.0)
MCHC: 33.7 g/dL (ref 30.0–36.0)
MCV: 91.4 fL (ref 78.0–100.0)
Platelets: 182 10*3/uL (ref 150–400)
RBC: 4.09 MIL/uL — ABNORMAL LOW (ref 4.22–5.81)
RDW: 13.5 % (ref 11.5–15.5)
WBC: 7.9 10*3/uL (ref 4.0–10.5)

## 2012-09-29 LAB — CULTURE, RESPIRATORY W GRAM STAIN: Culture: NORMAL

## 2012-09-29 LAB — GLUCOSE, CAPILLARY
Glucose-Capillary: 124 mg/dL — ABNORMAL HIGH (ref 70–99)
Glucose-Capillary: 190 mg/dL — ABNORMAL HIGH (ref 70–99)

## 2012-09-29 LAB — BASIC METABOLIC PANEL
BUN: 20 mg/dL (ref 6–23)
CO2: 28 mEq/L (ref 19–32)
Calcium: 8.8 mg/dL (ref 8.4–10.5)
Chloride: 103 mEq/L (ref 96–112)
Creatinine, Ser: 0.89 mg/dL (ref 0.50–1.35)
GFR calc Af Amer: >90 mL/min (ref >90)
GFR calc non Af Amer: 84 mL/min — ABNORMAL LOW (ref >90)
Glucose, Bld: 114 mg/dL — ABNORMAL HIGH (ref 70–99)
Potassium: 3.7 mEq/L (ref 3.5–5.1)
Sodium: 142 mEq/L (ref 135–145)

## 2012-09-29 MED ORDER — AMOXICILLIN-POT CLAVULANATE 875-125 MG PO TABS
1.0000 | ORAL_TABLET | Freq: Two times a day (BID) | ORAL | Status: DC
  Administered 2012-09-29: 1 via ORAL
  Filled 2012-09-29 (×2): qty 1

## 2012-09-29 MED ORDER — TRAMADOL HCL 50 MG PO TABS: 50.0000 mg | ORAL_TABLET | Freq: Four times a day (QID) | ORAL | Status: DC | PRN

## 2012-09-29 MED ORDER — AMOXICILLIN-POT CLAVULANATE 875-125 MG PO TABS: 1.0000 | ORAL_TABLET | Freq: Two times a day (BID) | ORAL | Status: AC

## 2012-09-29 MED ORDER — IBUPROFEN 800 MG PO TABS: 800.0000 mg | ORAL_TABLET | Freq: Three times a day (TID) | ORAL | Status: AC

## 2012-09-29 MED ORDER — GUAIFENESIN ER 600 MG PO TB12: 1200.0000 mg | ORAL_TABLET | Freq: Two times a day (BID) | ORAL | Status: AC

## 2012-09-29 NOTE — Discharge Summary (Signed)
Physician Discharge Summary  Bradley [name] NFA:[id number] DOB: [birth year] DOA: 09/27/2012  PCP: [name], MD  Admit date: 09/27/2012 Discharge date: 09/29/2012  Time spent: 65 minutes  Recommendations for Outpatient Follow-up:  1. Patient is to followup with PCP one week post discharge.  Discharge Diagnoses:  Principal Problem:  *Chest pain Active Problems:  HYPERLIPIDEMIA  HYPERTENSION  Diabetes mellitus  CAP (community acquired pneumonia)  Chronic combined systolic and diastolic CHF (congestive heart failure)   Discharge Condition: Stable and improved  Diet recommendation: Heart healthy  Filed Weights   09/27/12 1443 09/28/12 0549 09/29/12 0510  Weight: 97.2 kg (214 lb 4.6 oz) 96.1 kg (211 lb 13.8 oz) 96.7 kg (213 lb 3 oz)    History of present illness:  71 y/o man with h/o CAD, combined CHF, a fib s/p ablation and PPM off of anticoagulation 2/2 recent intracranial bleed, DM and HTN. Presents to the hospital with complaints of CP that began this morning while in bed, non-radiating, not relieved by nitroglycerin and associated with a productive cough. He had an admission to the hospital in October for CP as well, and this was deemed to be non-cardiac in origen. He recently saw his cardiologist, Dr. Ladona Ridgel on 12/3 who up titrated his lasix for a few days due to some weight gain that was attributed to dietary indiscretion over the Thanksgiving Holiday. We have been asked to admit him for further evaluation and management.   Hospital Course:  #1 atypical chest pain  Patient was admitted with a community-acquired pneumonia. Patient was also complaining of chest pain in the left lateral rib region associated with a cough and reproducible. Patient last admission in October was for chest pain and deemed to be noncardiac in origin after being followed per cardiology. It was felt patient's atypical chest pain was likely secondary to musculoskeletal as patient's chest wall is tender to  palpation versus secondary to pneumonia. Patient denies any shortness of breath. Patient troponins were negative. Patient was started empirically on ibuprofen scheduled and tramadol as needed. Patient improved somewhat clinically. Patient was empirically placed on IV Rocephin and azithromycin for community acquired pneumonia. Patient will continue empiric antibiotics and will be discharged on 5 days of oral Augmentin to complete a one-week course of antibiotic therapy. Patient will also be discharged on scheduled ibuprofen 800 mg 3 times daily for the next 5 days and will followup with his PCP as outpatient.  #2 community-acquired pneumonia  On presentation patient had presented with productive cough, chest pain associated with cough and chest x-ray which was done was worrisome for community acquired pneumonia. Sputum Gram stain and cultures were ordered and came back with oral pharyngeal flora. Patient remained afebrile. Patient improved clinically during the hospitalization. A urine Legionella antigen which was obtained was negative. A urine pneumococcus antigen which was also obtained was negative. Patient was maintained on IV Rocephin and azithromycin during the hospitalization. Patient will be discharged home on oral Augmentin for 5 more days to complete a one-week course of antibiotic therapy. She will followup with PCP as outpatient.  The rest of patient's chronic medical issues and in stable throughout the hospitalization and patient be discharged in stable and improved condition.     Procedures:  Chest x-ray 09/27/2012  Consultations:  None  Discharge Exam: Filed Vitals:   09/28/12 1803 09/28/12 2109 09/29/12 0510 09/29/12 1035  BP: 102/63 106/68 117/70 109/68  Pulse: 69 71 72 70  Temp: 97.7 F (36.5 C) 98.1 F (36.7 C) 96.8  F (36 C) 97.4 F (36.3 C)  TempSrc: Oral Oral Oral Oral  Resp: 19 18 18 20   Height:      Weight:   96.7 kg (213 lb 3 oz)   SpO2: 92% 92% 97% 96%     General: NAD Cardiovascular: RRR Respiratory: Coarse BS in base o/w clear  Discharge Instructions  Discharge Orders    Future Appointments: Provider: Department: Dept Phone: Center:   11/01/2012 11:00 AM [name], MD [facility]  [place] [phone] [facility]   12/18/2012 1:45 PM Marinus Maw, MD [facility] [place]) [phone] [facility]     Future Orders Please Complete By Expires   Diet - low sodium heart healthy      Increase activity slowly      Discharge instructions      Comments:   Followup with [name], MD in 1 week.       Medication List     As of 09/29/2012 11:23 AM    TAKE these medications         amLODipine 10 MG tablet   Commonly known as: NORVASC   Take 1 tablet (10 mg total) by mouth daily.      amoxicillin-clavulanate 875-125 MG per tablet   Commonly known as: AUGMENTIN   Take 1 tablet by mouth 2 (two) times daily. Take for 5 days then stop.   Start taking on: 09/30/2012      carvedilol 25 MG tablet   Commonly known as: COREG   Take 1.5 tablets (37.5 mg total) by mouth 2 (two) times daily with a meal.      escitalopram 10 MG tablet   Commonly known as: LEXAPRO   Take 1 tablet (10 mg total) by mouth at bedtime.      furosemide 40 MG tablet   Commonly known as: LASIX   Take 1.5 tablets (60 mg total) by mouth 2 (two) times daily.      glyBURIDE 5 MG tablet   Commonly known as: DIABETA   Take 5 mg by mouth daily.      guaiFENesin 600 MG 12 hr tablet   Commonly known as: MUCINEX   Take 2 tablets (1,200 mg total) by mouth 2 (two) times daily. Take for 5 days then stop.      ibuprofen 800 MG tablet   Commonly known as: ADVIL,MOTRIN   Take 1 tablet (800 mg total) by mouth 3 (three) times daily. Take 1 tablet 3 times daily x5 days then take as needed for chest pain.      losartan 100 MG tablet   Commonly known as: COZAAR   Take 1 tablet (100 mg total) by mouth daily.      multivitamins  ther. w/minerals Tabs   Take 1 tablet by mouth daily.      niacin-lovastatin 1000-20 MG 24 hr tablet   Commonly known as: ADVICOR   Take 1 tablet by mouth at bedtime.      nitroGLYCERIN 0.4 MG SL tablet   Commonly known as: NITROSTAT   Place 1 tablet (0.4 mg total) under the tongue every 5 (five) minutes as needed for chest pain.      pantoprazole 40 MG tablet   Commonly known as: PROTONIX   Take 40 mg by mouth every morning.      sitaGLIPtin 100 MG tablet   Commonly known as: JANUVIA   Take 1 tablet (100 mg total) by mouth daily.      traMADol 50  MG tablet   Commonly known as: ULTRAM   Take 1 tablet (50 mg total) by mouth every 6 (six) hours as needed for pain (pain).      zolpidem 10 MG tablet   Commonly known as: AMBIEN   Take 1 tablet (10 mg total) by mouth at bedtime as needed. For sleep           Follow-up Information    Follow up with [name], MD. Schedule an appointment as soon as possible for a visit in 1 week.   Contact information:   [address] [facility] [address] [place] [address] [phone]           The results of significant diagnostics from this hospitalization (including imaging, microbiology, ancillary and laboratory) are listed below for reference.    Significant Diagnostic Studies: Dg Chest Portable 1 View  09/27/2012  *RADIOLOGY REPORT*  Clinical Data: Chest pain, cough  PORTABLE CHEST - 1 VIEW  Comparison: 07/23/2012  Findings: Cardiomegaly again noted.  Dual lead cardiac pacemaker is unchanged in position.  There is hazy perihilar and bilateral lower lobe airspace disease which may be due to pulmonary edema or bilateral infiltrate.  Clinical correlation is necessary.  IMPRESSION: Cardiomegaly again noted.  Stable dual lead cardiac pacemaker position.  Hazy perihilar and bilateral lower lobe airspace disease may be due to bilateral infiltrates or evolving pulmonary edema. Clinical correlation is necessary.   Original Report  Authenticated By: Natasha Mead, M.D.     Microbiology: Recent Results (from the past 240 hour(s))  CULTURE, EXPECTORATED SPUTUM-ASSESSMENT     Status: Normal   Collection Time   09/27/12  1:57 PM      Component Value Range Status Comment   Specimen Description SPUTUM   Final    Special Requests NONE   Final    Sputum evaluation     Final    Value: THIS SPECIMEN IS ACCEPTABLE. RESPIRATORY CULTURE REPORT TO FOLLOW.   Report Status 09/27/2012 FINAL   Final   CULTURE, RESPIRATORY     Status: Normal   Collection Time   09/27/12  1:57 PM      Component Value Range Status Comment   Specimen Description SPUTUM   Final    Special Requests NONE   Final    Gram Stain     Final    Value: ABUNDANT WBC PRESENT,BOTH PMN AND MONONUCLEAR     FEW SQUAMOUS EPITHELIAL CELLS PRESENT     FEW GRAM POSITIVE RODS     FEW GRAM POSITIVE COCCI IN CLUSTERS     IN PAIRS IN CHAINS RARE GRAM NEGATIVE RODS   Culture NORMAL OROPHARYNGEAL FLORA   Final    Report Status 09/29/2012 FINAL   Final      Labs: Basic Metabolic Panel:  Lab 09/29/12 0865 09/28/12 0227 09/27/12 1527 09/27/12 0834  NA 142 138 -- 137  K 3.7 4.1 -- 3.7  CL 103 102 -- 99  CO2 28 27 -- 28  GLUCOSE 114* 190* -- 157*  BUN 20 17 -- 14  CREATININE 0.89 0.73 0.85 0.89  CALCIUM 8.8 9.1 -- 8.8  MG -- -- -- --  PHOS -- -- -- --   Liver Function Tests:  Lab 09/27/12 0834  AST 16  ALT 12  ALKPHOS 83  BILITOT 1.1  PROT 7.1  ALBUMIN 3.3*   No results found for this basename: LIPASE:5,AMYLASE:5 in the last 168 hours No results found for this basename: AMMONIA:5 in the  last 168 hours CBC:  Lab 09/29/12 0520 09/28/12 0227 09/27/12 1527 09/27/12 0834  WBC 7.9 8.9 9.7 12.1*  NEUTROABS -- -- -- --  HGB 12.6* 13.6 13.5 12.9*  HCT 37.4* 38.6* 39.3 38.1*  MCV 91.4 90.6 92.3 91.4  PLT 182 167 152 171   Cardiac Enzymes:  Lab 09/28/12 0227 09/27/12 2011 09/27/12 1527  CKTOTAL -- -- --  CKMB -- -- --  CKMBINDEX -- -- --  TROPONINI <0.30  <0.30 <0.30   BNP: BNP (last 3 results)  Basename 09/28/12 0227 09/27/12 1527 07/24/12 0540  PROBNP 1716.0* 1608.0* 345.1*   CBG:  Lab 09/29/12 0543 09/28/12 2108 09/28/12 1628 09/28/12 1139 09/28/12 0558  GLUCAP 124* 146* 161* 169* 138*       Signed:  Carolynne Schuchard  [facility] 09/29/2012, 11:23 AM

## 2012-09-29 NOTE — Progress Notes (Signed)
Patient's IV and telemetry has been discontinued; patient verbalizes understanding of discharge instructions and is being discharged home with wife and son.  Lorretta Harp RN

## 2012-10-01 ENCOUNTER — Telehealth: Payer: Self-pay | Admitting: Internal Medicine

## 2012-10-01 NOTE — Telephone Encounter (Signed)
Patient discharged from the hospital after being treated for pneumonia few days ago, needs a followup with me within few days. Please arrange

## 2012-10-02 NOTE — Telephone Encounter (Signed)
Discussed with pt, scheduled appt 12.27.13.

## 2012-10-03 ENCOUNTER — Ambulatory Visit (HOSPITAL_BASED_OUTPATIENT_CLINIC_OR_DEPARTMENT_OTHER)
Admission: RE | Admit: 2012-10-03 | Discharge: 2012-10-03 | Disposition: A | Discharge status: Home / Self Care | Payer: Medicare Other | Source: Ambulatory Visit | Attending: Internal Medicine | Admitting: Internal Medicine

## 2012-10-03 ENCOUNTER — Ambulatory Visit (INDEPENDENT_AMBULATORY_CARE_PROVIDER_SITE_OTHER): Payer: Medicare Other | Admitting: Internal Medicine

## 2012-10-03 VITALS — BP 122/76 | HR 80 | Temp 97.7°F | Wt 216.0 lb

## 2012-10-03 DIAGNOSIS — R059 Cough, unspecified: Secondary | ICD-10-CM | POA: Insufficient documentation

## 2012-10-03 DIAGNOSIS — R0989 Other specified symptoms and signs involving the circulatory and respiratory systems: Secondary | ICD-10-CM | POA: Insufficient documentation

## 2012-10-03 DIAGNOSIS — J189 Pneumonia, unspecified organism: Secondary | ICD-10-CM

## 2012-10-03 DIAGNOSIS — R05 Cough: Secondary | ICD-10-CM | POA: Insufficient documentation

## 2012-10-03 MED ORDER — DOXYCYCLINE HYCLATE 100 MG PO TABS: 100.0000 mg | ORAL_TABLET | Freq: Two times a day (BID) | ORAL | Status: DC

## 2012-10-03 NOTE — Progress Notes (Signed)
  Subjective:    Patient ID: Bradley [name], male    DOB: 06-23-1941, 71 y.o.   MRN: [id number]  HPI  Hospital followup Was admitted to the hospital 09/27/2012 and release on the 20th. Presented with left-sided chest pain and right sided chest/abdominal pain. He also had cough with thick green sputum production. Cardiac enzymes were negative, kidney function normal, CBC without elevated white count. Chest x-ray showed bilateral lower airspace disease. Patient was diagnosed with community-acquired pneumonia, had Rocephin and Zithromax and was discharged on augmentin  to complete one week of treatment. He is here for followup.  Past Medical History:   06-2012, intracranial bleed, d./c coumadin HTN (at some point hard to control, saw Dr Lowell Guitar)   AODM dx 1999   Anxiety   BPH---saw Dr [name] 2004 , normal renal u/s  INSOMNIA, CHRONIC   CV:  ---ATRIAL FIBRILLATION, PAROXYSMAL (Dr Ladona Ridgel)  ; off coumadin [phone]  ---03-2007.Marland KitchenMarland KitchenMarland KitchenStress test neg, ECHO 55%   ---04-2007: carciac cath w/ no high grade stenosis, + P-HTN   ---CHF   ---s/p AV node ablation & BiV PPM implantation 09/08/11   PULMONARY  ---2008: h/o recurrent loculated right plural effusion, s/p eval Dr [name] , Dr Laneta Simmers. W/U included thoracentesis   --09-2011 aCT of the chest, several lymphadenopathies. Sees pulmonary   Leg ulcer, dx end of 2011, ABI neg 10-2010, saw vascular (Dr Arbie Cookey 10-2010) -- venous insuff. , conservative treatment    Past Surgical History:  Status post large pneumothorax with fibrothorax.    Status post lung decortication -Dr. Laneta Simmers  Status post cardiac catheterization     Family History: MI-- no DM-- F Colon ca-- no prostate ca--no  Social History: Married, 1 son, moved from [place] 2003, lives with his wife.    Tobacco-- quit in the 80s. (used to smoke 2.5 ppd for 25 years) Alcohol-- very rarely No drugs.   Review of Systems Still feeling lousy, denies any fever before or after the admission. Still  coughing, at this time no further sputum production, cough is dry. Still has left anterior chest pain with cough only. Still has a sharp right-sided chest/abdominal pain with breathing only. Symptoms are not reproducible by twisting or bending his torso. No rash. No actual shortness of breath. Denies any nausea, vomiting; occasional diarrhea. No sinus congestion.     Objective:   Physical Exam  Abdominal:     General -- alert, well-developed, NAD.   Lungs --  no increased work of breathing, slightly decreased breath sounds throughout, at the right base there is a few crackles  Heart-- normal rate, regular rhythm, no murmur, and no gallop.   Abdomen--soft, non-tender, no distention, no masses, no HSM, no guarding, and no rigidity.   Extremities-- no pretibial edema bilaterally; calves symmetric and not tender Neurologic-- alert & oriented X3 and strength normal in all extremities. Psych-- Cognition and judgment appear intact. Alert and cooperative with normal attention span and concentration.  not anxious appearing and not depressed appearing.      Assessment & Plan:

## 2012-10-03 NOTE — Progress Notes (Signed)
Utilization Review Completed.   Connell Bognar, RN, BSN Nurse Case Manager  [phone]  

## 2012-10-03 NOTE — Patient Instructions (Addendum)
Get a chest XR at [facility], corner of [address] (10 minutes form here); they are open 24/7 [address]  [place], [place] [address] [phone] --- Mucinex DM twice a day as needed for cough Doxycycline for one week Rest, fluids, Tylenol. If not improving in the next few days let me know Please come back in 6 weeks for a checkup

## 2012-10-05 ENCOUNTER — Encounter: Payer: Self-pay | Admitting: Internal Medicine

## 2012-10-05 NOTE — Assessment & Plan Note (Signed)
Pneumonia Recently admitted with pneumonia, status post antibiotics x 1 week, still having pain in the left chest and right chest. Chest x-ray today showed no worsening of the pneumonia, no loculation. Recommend  additional antibiotics, he is allergic to Avelox consequently we will do doxycycline for one week. If he's not improving in the next few days and he continued having pleuritic pain, or other issues may need to be rule out for other conditions  such as PE, however given the recent green sputum production, pneumonia is the most likely diagnosis.

## 2012-10-06 ENCOUNTER — Ambulatory Visit: Payer: Medicare Other | Admitting: Internal Medicine

## 2012-10-09 ENCOUNTER — Other Ambulatory Visit: Payer: Self-pay | Admitting: Internal Medicine

## 2012-10-09 NOTE — Telephone Encounter (Signed)
Refill done.  

## 2012-10-17 ENCOUNTER — Other Ambulatory Visit: Payer: Self-pay | Admitting: *Deleted

## 2012-10-17 ENCOUNTER — Other Ambulatory Visit: Payer: Self-pay | Admitting: Internal Medicine

## 2012-10-17 MED ORDER — CARVEDILOL 25 MG PO TABS: 37.5000 mg | ORAL_TABLET | Freq: Two times a day (BID) | ORAL | Status: DC

## 2012-10-17 NOTE — Telephone Encounter (Signed)
Refill done.  

## 2012-10-25 ENCOUNTER — Telehealth: Payer: Self-pay | Admitting: Internal Medicine

## 2012-10-25 MED ORDER — NIACIN-LOVASTATIN ER 1000-20 MG PO TB24: 1.0000 | ORAL_TABLET | Freq: Every day | ORAL | Status: DC

## 2012-10-25 MED ORDER — ZOLPIDEM TARTRATE 10 MG PO TABS: 10.0000 mg | ORAL_TABLET | Freq: Every evening | ORAL | Status: DC | PRN

## 2012-10-25 NOTE — Telephone Encounter (Signed)
Ok to refill? Last OV 12.24.13 Last filled 7.29.13

## 2012-10-25 NOTE — Telephone Encounter (Signed)
Pt wife called requesting refills for: 1. Advicor 2. Zolpidem [facility]

## 2012-10-25 NOTE — Telephone Encounter (Signed)
Ambien #[id number] and one refill Advicor refill for one year

## 2012-10-25 NOTE — Telephone Encounter (Signed)
Refill done.  

## 2012-11-01 ENCOUNTER — Ambulatory Visit: Payer: Medicare Other | Admitting: Internal Medicine

## 2012-11-02 ENCOUNTER — Ambulatory Visit: Payer: Medicare Other | Admitting: Internal Medicine

## 2012-11-13 ENCOUNTER — Encounter: Payer: Self-pay | Admitting: Lab

## 2012-11-14 ENCOUNTER — Ambulatory Visit (INDEPENDENT_AMBULATORY_CARE_PROVIDER_SITE_OTHER): Payer: Medicare Other | Admitting: Internal Medicine

## 2012-11-14 ENCOUNTER — Encounter: Payer: Self-pay | Admitting: Internal Medicine

## 2012-11-14 VITALS — BP 124/80 | HR 77 | Temp 97.8°F | Wt 215.0 lb

## 2012-11-14 DIAGNOSIS — I10 Essential (primary) hypertension: Secondary | ICD-10-CM

## 2012-11-14 DIAGNOSIS — J189 Pneumonia, unspecified organism: Secondary | ICD-10-CM

## 2012-11-14 DIAGNOSIS — E119 Type 2 diabetes mellitus without complications: Secondary | ICD-10-CM

## 2012-11-14 NOTE — Assessment & Plan Note (Signed)
Clinically resolved. Last chest x-ray showed improved areation.

## 2012-11-14 NOTE — Progress Notes (Signed)
  Subjective:    Patient ID: Bradley [name], male    DOB: 03-06-1941, 72 y.o.   MRN: [id number]  HPI Followup from previous visit. He is feeling very well, was diagnosed with pneumonia in December, currently denies fever or chills. No cough or sputum. Had chest pain, but that is largely resolved. Medication list reviewed, good compliance  Past Medical History:  06-2012, intracranial bleed, d./c coumadin  HTN (at some point hard to control, saw Dr Lowell Guitar)  AODM dx 1999  Anxiety  BPH---saw Dr [name] 2004 , normal renal u/s  INSOMNIA, CHRONIC  CV:  ---ATRIAL FIBRILLATION, PAROXYSMAL (Dr Ladona Ridgel) ; off coumadin 01-6961  ---03-2007.Marland KitchenMarland KitchenMarland KitchenStress test neg, ECHO 55%  ---04-2007: carciac cath w/ no high grade stenosis, + P-HTN  ---CHF  ---s/p AV node ablation & BiV PPM implantation 09/08/11  PULMONARY  ---2008: h/o recurrent loculated right plural effusion, s/p eval Dr [name] , Dr Laneta Simmers. W/U included thoracentesis  --09-2011 aCT of the chest, several lymphadenopathies. Sees pulmonary  Leg ulcer, dx end of 2011, ABI neg 10-2010, saw vascular (Dr Arbie Cookey 10-2010) -- venous insuff. , conservative treatment   Past Surgical History:  Status post large pneumothorax with fibrothorax.  Status post lung decortication -Dr. Laneta Simmers  Status post cardiac catheterization   Family History:  MI-- no  DM-- F  Colon ca-- no  prostate ca--no   Social History:  Married, 1 son, moved from [place] 2003, lives with his wife.  Tobacco-- quit in the 80s. (used to smoke 2.5 ppd for 25 years)  Alcohol-- very rarely  No drugs.    Review of Systems Denies lower extremity edema. No nausea, vomiting, diarrhea. Amlodipine dose was increased for better BP control, ambulatory BP is 120/80. Has not needed any nitroglycerin lately. Blood sugars are very good, very rarely has a high reading.    Objective:   Physical Exam General -- alert, well-developed  Lungs -- normal respiratory effort, no intercostal retractions, no  accessory muscle use, and normal breath sounds. No crackles found Heart-- normal rate, regular rhythm, no murmur, and no gallop.    Psych-- Cognition and judgment appear intact. Alert and cooperative with normal attention span and concentration.  not anxious appearing and not depressed appearing.       Assessment & Plan:

## 2012-11-14 NOTE — Assessment & Plan Note (Signed)
Currently well controlled, no change 

## 2012-11-14 NOTE — Patient Instructions (Addendum)
Take glyburide half tablet daily. Other  medicines remain the same. Please come back for a checkup in 3 months

## 2012-11-14 NOTE — Assessment & Plan Note (Signed)
Seems to be doing well, last A1c less than 6, will decrease glyburide to 2.5 mg daily.

## 2012-11-20 ENCOUNTER — Other Ambulatory Visit: Payer: Self-pay | Admitting: Internal Medicine

## 2012-11-20 NOTE — Telephone Encounter (Signed)
Refill done.  

## 2012-12-18 ENCOUNTER — Ambulatory Visit (INDEPENDENT_AMBULATORY_CARE_PROVIDER_SITE_OTHER): Payer: Medicare Other | Admitting: Internal Medicine

## 2012-12-18 ENCOUNTER — Encounter: Payer: Self-pay | Admitting: Internal Medicine

## 2012-12-18 VITALS — BP 126/80 | HR 74 | Ht 68.0 in | Wt 218.0 lb

## 2012-12-18 DIAGNOSIS — I509 Heart failure, unspecified: Secondary | ICD-10-CM

## 2012-12-18 DIAGNOSIS — I5042 Chronic combined systolic (congestive) and diastolic (congestive) heart failure: Secondary | ICD-10-CM

## 2012-12-18 DIAGNOSIS — Z950 Presence of cardiac pacemaker: Secondary | ICD-10-CM

## 2012-12-18 DIAGNOSIS — I4891 Unspecified atrial fibrillation: Secondary | ICD-10-CM

## 2012-12-18 LAB — PACEMAKER DEVICE OBSERVATION
BATTERY VOLTAGE: 3.02 V
LV LEAD IMPEDENCE PM: 551 Ohm
LV LEAD THRESHOLD: 0.875 V
RV LEAD IMPEDENCE PM: 608 Ohm
RV LEAD THRESHOLD: 0.625 V
VENTRICULAR PACING PM: 98.6

## 2012-12-18 NOTE — Assessment & Plan Note (Signed)
His ventricular rate is well controlled. He will continue his current medical therapy. 

## 2012-12-18 NOTE — Patient Instructions (Addendum)
Your physician wants you to follow-up in: 6 months with device clinic and 12 months with Dr Taylor You will receive a reminder letter in the mail two months in advance. If you don't receive a letter, please call our office to schedule the follow-up appointment.  

## 2012-12-18 NOTE — Assessment & Plan Note (Signed)
hhis Medtronic biventricular pacemaker is working normally. His fluid index is improved and his weight is stable. His activity is improved. Plan to recheck in several months.

## 2012-12-18 NOTE — Assessment & Plan Note (Signed)
Bradley [name] is better than he is been a while. His congestive heart failure appears well compensated. He will continue his current medical therapy, and maintain a low-sodium diet.

## 2012-12-18 NOTE — Progress Notes (Signed)
HPI Bradley [name] returns today for followup. He is a 72 year old man with chronic atrial fibrillation, nonischemic cardiomyopathy, status post AV node ablation and biventricular pacemaker insertion. In the interim, the patient has done better. His heart failure symptoms are improved. He notes that his weight is controlled. When asked what the change is as effective these results, he notes that his wife has "cut off his salt." Allergies  Allergen Reactions  . Hydrocodone Other (See Comments)    "given to him in the hospital; went thru withdrawals once home; dr said not to take it again" (09/27/2012)  . Tramadol Other (See Comments)    "given to him in the hospital; went thru withdrawals once home; dr said not to take it again" (09/27/2012)  . Tadalafil Other (See Comments)    headache, backache  . Avelox (Moxifloxacin Hydrochloride) Itching and Other (See Comments)    Headache     Current Outpatient Prescriptions  Medication Sig Dispense Refill  . amLODipine (NORVASC) 10 MG tablet Take 1 tablet (10 mg total) by mouth daily.  30 tablet  1  . carvedilol (COREG) 25 MG tablet TAKE 1 AND 1/2 TABLETS BY MOUTH TWICE DAILY WITH A MEAL  90 tablet  0  . doxycycline (VIBRA-TABS) 100 MG tablet Take 1 tablet (100 mg total) by mouth 2 (two) times daily.  14 tablet  0  . escitalopram (LEXAPRO) 10 MG tablet Take 1 tablet (10 mg total) by mouth at bedtime.  30 tablet  1  . furosemide (LASIX) 40 MG tablet Take 1.5 tablets (60 mg total) by mouth 2 (two) times daily.  270 tablet  3  . glyBURIDE (DIABETA) 5 MG tablet Take 2.5 mg by mouth daily.       Marland Kitchen losartan (COZAAR) 100 MG tablet Take 1 tablet (100 mg total) by mouth daily.  90 tablet  3  . Multiple Vitamins-Minerals (MULTIVITAMINS THER. W/MINERALS) TABS Take 1 tablet by mouth daily.        . niacin-lovastatin (ADVICOR) 1000-20 MG 24 hr tablet Take 1 tablet by mouth at bedtime.  90 tablet  3  . nitroGLYCERIN (NITROSTAT) 0.4 MG SL tablet Place 1 tablet (0.4 mg  total) under the tongue every 5 (five) minutes as needed for chest pain.  90 tablet  0  . ONE TOUCH ULTRA TEST test strip USE AS DIRECTED  3 each  3  . pantoprazole (PROTONIX) 40 MG tablet Take 40 mg by mouth every morning.      . sitaGLIPtin (JANUVIA) 100 MG tablet Take 1 tablet (100 mg total) by mouth daily.  90 tablet  3  . zolpidem (AMBIEN) 10 MG tablet Take 1 tablet (10 mg total) by mouth at bedtime as needed. For sleep  90 tablet  1   No current facility-administered medications for this visit.     Past Medical History  Diagnosis Date  . HTN (hypertension)   . HLD (hyperlipidemia)   . BPH (benign prostatic hyperplasia)     Saw Dr [name] 2004, normal renal u/s  . Leg ulcer 2011  . CHF (congestive heart failure)     Thought primarily to be non-systolic although EF down (EF 40-98% 12/2010, down to 35-40% 09/05/11), cath 2008 with no CAD, nuclear study 07/2011 showing Small area of reversibility in the distal ant/lat wall the left ventricle suspicious for ischemia/septal wall HK but felt to be low risk  (per D/C Summary 07/2011)  . Pleural effusion 2008    S/p decortication  . Insomnia   .  Atrial fibrillation     s/p AV node ablation & BiV PPM implantation 09/08/11 (op dictation pending)  . Sinoatrial node dysfunction   . Pulmonary HTN   . Coronary artery disease   . Anginal pain   . Peripheral vascular disease   . Anemia     intermittent  . Pacemaker   . Heart murmur   . Pneumonia     "today and often when I worked in [place]; had it once/yr X 5 yr then" (09/27/2012)  . Shortness of breath     "all the time now" (09/27/2012)  . Type II diabetes mellitus 1999  . Daily headache     "sometimes" (09/27/2012)  . Migraines   . Arthritis     "?back" (09/27/2012)  . On home oxygen therapy     "sleep w/2L q hs" (09/27/2012)    ROS:   All systems reviewed and negative except as noted in the HPI.   Past Surgical History  Procedure Laterality Date  . Pleural scarification     . Lung decortication    . Colonoscopy  03/10/11    normal  . Pneumothorax with fibrothorax  ~ 2010  . Insert / replace / remove pacemaker  09/08/11    pacemaker placement  . Tonsillectomy      "as a kid" (09/27/2012)  . Cardiac catheterization      "couple times; never had balloon or stent" (09/27/2012)     Family History  Problem Relation Age of Onset  . Diabetes Father   . Breast cancer Maternal Aunt   . Heart attack Neg Hx   . Prostate cancer Neg Hx   . Colon cancer Neg Hx   . Cancer Neg Hx     colon or prostate  . Coronary artery disease Father   . Polycythemia Mother      History   Social History  . Marital Status: Married    Spouse Name: [place]    Number of Children: 1  . 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 Smoking status: Former Smoker -- 2.50 packs/day for 25 years    Types: Cigarettes    Quit date: 10/11/1980  . Smokeless tobacco: Never Used  . Alcohol Use: Yes     Comment: 09/27/2012 "occasionally once a year; if that"  . Drug Use: No  . Sexually Active: No   Other Topics Concern  . Not on file   Social History Narrative   Moved from [place] 2003.Marland Kitchen   He lives w/ his wife               BP 126/80  Pulse 74  Ht 5\' 8"  (1.727 m)  Wt 218 lb (98.884 kg)  BMI 33.15 kg/m2  SpO2 95%  Physical Exam:  Well appearing 72 year old man,NAD HEENT: Unremarkable Neck:  7 cmJVD, no thyromegally Lungs:  Clear with no wheezes, rales, or rhonchi. HEART:  Regular rate rhythm, no murmurs, no rubs, no clicks Abd:  soft, positive bowel sounds, no organomegally, no rebound, no guarding Ext:  2 plus pulses, no edema, no cyanosis, no clubbing Skin:  No rashes no nodules Neuro:  CN II through XII intact, motor grossly intact  EKG - atrial fibrillation with biventricular pacing  DEVICE  Normal device function.  See PaceArt for details.   Assess/Plan:

## 2013-01-19 ENCOUNTER — Other Ambulatory Visit: Payer: Self-pay | Admitting: Internal Medicine

## 2013-01-22 NOTE — Telephone Encounter (Signed)
Refill done.  

## 2013-01-24 ENCOUNTER — Other Ambulatory Visit: Payer: Self-pay | Admitting: Internal Medicine

## 2013-01-24 NOTE — Telephone Encounter (Signed)
Refill done.  

## 2013-02-09 ENCOUNTER — Encounter: Payer: Self-pay | Admitting: Lab

## 2013-02-12 ENCOUNTER — Telehealth: Payer: Self-pay | Admitting: Internal Medicine

## 2013-02-12 ENCOUNTER — Ambulatory Visit (INDEPENDENT_AMBULATORY_CARE_PROVIDER_SITE_OTHER): Payer: Medicare Other | Admitting: Internal Medicine

## 2013-02-12 ENCOUNTER — Encounter: Payer: Self-pay | Admitting: Internal Medicine

## 2013-02-12 VITALS — BP 118/82 | HR 86 | Wt 213.0 lb

## 2013-02-12 DIAGNOSIS — I5022 Chronic systolic (congestive) heart failure: Secondary | ICD-10-CM

## 2013-02-12 DIAGNOSIS — E119 Type 2 diabetes mellitus without complications: Secondary | ICD-10-CM

## 2013-02-12 DIAGNOSIS — I10 Essential (primary) hypertension: Secondary | ICD-10-CM

## 2013-02-12 MED ORDER — CARVEDILOL 25 MG PO TABS: ORAL_TABLET | ORAL | Status: DC

## 2013-02-12 MED ORDER — PANTOPRAZOLE SODIUM 40 MG PO TBEC: DELAYED_RELEASE_TABLET | ORAL | Status: DC

## 2013-02-12 MED ORDER — FUROSEMIDE 40 MG PO TABS: 60.0000 mg | ORAL_TABLET | Freq: Two times a day (BID) | ORAL | Status: DC

## 2013-02-12 MED ORDER — SITAGLIPTIN PHOSPHATE 100 MG PO TABS: 100.0000 mg | ORAL_TABLET | Freq: Every day | ORAL | Status: DC

## 2013-02-12 MED ORDER — NIACIN-LOVASTATIN ER 1000-20 MG PO TB24: 1.0000 | ORAL_TABLET | Freq: Every day | ORAL | Status: DC

## 2013-02-12 MED ORDER — LOSARTAN POTASSIUM 100 MG PO TABS: 100.0000 mg | ORAL_TABLET | Freq: Every day | ORAL | Status: DC

## 2013-02-12 MED ORDER — ESCITALOPRAM OXALATE 10 MG PO TABS: 10.0000 mg | ORAL_TABLET | Freq: Every day | ORAL | Status: DC

## 2013-02-12 MED ORDER — ZOLPIDEM TARTRATE 10 MG PO TABS: 10.0000 mg | ORAL_TABLET | Freq: Every evening | ORAL | Status: DC | PRN

## 2013-02-12 MED ORDER — AMLODIPINE BESYLATE 10 MG PO TABS: 10.0000 mg | ORAL_TABLET | Freq: Every day | ORAL | Status: DC

## 2013-02-12 MED ORDER — GLYBURIDE 5 MG PO TABS: 2.5000 mg | ORAL_TABLET | Freq: Every day | ORAL | Status: DC

## 2013-02-12 NOTE — Assessment & Plan Note (Signed)
Seems to be doing well, occasional chest pain, symptoms are not getting more frequent.

## 2013-02-12 NOTE — Assessment & Plan Note (Signed)
Well-controlled, no change, check a BMP 

## 2013-02-12 NOTE — Assessment & Plan Note (Signed)
Patient taking less glyburide b/c last a 1c was < 6, CBGs ranged from 90 to 200. Will check a hemoglobin A1c

## 2013-02-12 NOTE — Progress Notes (Signed)
  Subjective:    Patient ID: Bradley [name], male    DOB: 09/09/1941, 72 y.o.   MRN: [id number]  HPI Routine office visit. Diabetes, we decrease the glyburide dose because the A1c was less than 6, since then he had no problems, CBGs ranged from 90 to 200s, couldn't tell me with more detail his readings. Went to see Cardiology 3- 2014, it was felt to be stable, next visit with them~ 6 months. Needs several refills.  Past Medical History:   06-2012, intracranial bleed, d./c coumadin   HTN (at some point hard to control, saw Dr Lowell Guitar)   AODM dx 1999   Anxiety   BPH---saw Dr [name] 2004 , normal renal u/s   INSOMNIA, CHRONIC   CV:  ---ATRIAL FIBRILLATION, PAROXYSMAL (Dr Ladona Ridgel) ; off coumadin 01-980   ---03-2007.Marland KitchenMarland KitchenMarland KitchenStress test neg, ECHO 55%   ---04-2007: carciac cath w/ no high grade stenosis, + P-HTN   ---CHF   ---s/p AV node ablation & BiV PPM implantation 09/08/11   PULMONARY  ---2008: h/o recurrent loculated right plural effusion, s/p eval Dr [name] , Dr Laneta Simmers. W/U included thoracentesis   --09-2011 aCT of the chest, several lymphadenopathies. Sees pulmonary   Leg ulcer, dx end of 2011, ABI neg 10-2010, saw vascular (Dr Arbie Cookey 10-2010) -- venous insuff. , conservative treatment   Past Surgical History:   Status post large pneumothorax with fibrothorax.   Status post lung decortication -Dr. Laneta Simmers   Status post cardiac catheterization   Family History:   MI-- no   DM-- F   Colon ca-- no   prostate ca--no   Social History:   Married, 1 son, moved from [place] 2003, lives with his wife.   Tobacco-- quit in the 80s. (used to smoke 2.5 ppd for 25 years)   Alcohol-- very rarely   No drugs.    Review of Systems Denies any low blood sugar symptoms. Reports a single episode of chest pain with quick relief with one nitroglycerin, symptoms are not getting more frequent, he stays is related to emotional distress but denies anxiety perse, occasionally gets slightly depressed mostly because his  wife's health and some issues with his son, reports the symptoms are mild, not suicidal.     Objective:   Physical Exam  General -- alert, well-developed, No physical or emotional distress  Lungs -- normal respiratory effort, no intercostal retractions, no accessory muscle use, and normal breath sounds.   Heart-- normal rate, regular rhythm, no murmur  Extremities-- no pretibial edema bilaterally  Neurologic-- alert & oriented X3 and strength normal in all extremities. Psych-- Cognition and judgment appear intact. Alert and cooperative with normal attention span and concentration.  not anxious appearing and not depressed appearing.       Assessment & Plan:  A number of medications were refilled today, he seems to be stable, followup in 4 months.

## 2013-02-12 NOTE — Telephone Encounter (Signed)
Pt wife called about her husband advicor and [place] medication. She wanted to speak to lindsay. Please call patient wife back thanks

## 2013-02-12 NOTE — Telephone Encounter (Signed)
Pt wife called requesting Adalberto Ill and Advicor to be prined and mailed to them. They prefer to send in Rx by mail instead of having them escribed.

## 2013-02-17 ENCOUNTER — Telehealth: Payer: Self-pay | Admitting: Internal Medicine

## 2013-02-17 NOTE — Telephone Encounter (Signed)
Labs ordered at last OV not done, please arrange

## 2013-02-19 NOTE — Telephone Encounter (Signed)
thx

## 2013-02-19 NOTE — Telephone Encounter (Signed)
Pt scheduled for tomorrow, 02/20/13.

## 2013-02-20 ENCOUNTER — Other Ambulatory Visit: Payer: Medicare Other

## 2013-02-20 LAB — BASIC METABOLIC PANEL
BUN: 9 mg/dL (ref 6–23)
CO2: 30 mEq/L (ref 19–32)
Calcium: 8.9 mg/dL (ref 8.4–10.5)
Chloride: 105 mEq/L (ref 96–112)
Creatinine, Ser: 0.8 mg/dL (ref 0.4–1.5)
GFR: 95.46 mL/min (ref >60.00)
Glucose, Bld: 123 mg/dL — ABNORMAL HIGH (ref 70–99)
Potassium: 4 mEq/L (ref 3.5–5.1)
Sodium: 141 mEq/L (ref 135–145)

## 2013-02-20 LAB — HEMOGLOBIN A1C: Hgb A1c MFr Bld: 5.9 % (ref 4.6–6.5)

## 2013-02-20 LAB — HEMOGLOBIN: Hemoglobin: 15.8 g/dL (ref 13.0–17.0)

## 2013-02-21 ENCOUNTER — Encounter: Payer: Self-pay | Admitting: *Deleted

## 2013-05-16 ENCOUNTER — Other Ambulatory Visit: Payer: Self-pay | Admitting: Internal Medicine

## 2013-05-16 ENCOUNTER — Other Ambulatory Visit: Payer: Self-pay

## 2013-05-16 ENCOUNTER — Telehealth: Payer: Self-pay

## 2013-05-16 MED ORDER — AMLODIPINE BESYLATE 10 MG PO TABS: 10.0000 mg | ORAL_TABLET | Freq: Every day | ORAL | Status: DC

## 2013-05-16 NOTE — Telephone Encounter (Signed)
[facility]oke [facility]. They claim they did not receive rx on 02/12/13. Spoke with patient, he was given a printed rx for amlodipine on 02/12/13 and has not filled it. Asked pt. To bring rx back to office and would [facility] to [facility]. Will deny electronic rx and fax already given rx from patient

## 2013-05-16 NOTE — Telephone Encounter (Signed)
Patient returned amlodipine Rx that had been printed at his May visit because he needed it to [facility] to [facility]. Canceled and resent.

## 2013-05-24 ENCOUNTER — Other Ambulatory Visit: Payer: Self-pay | Admitting: Internal Medicine

## 2013-05-25 NOTE — Telephone Encounter (Signed)
Six month supply filled 02/2013. Refill requested too soon.

## 2013-06-15 ENCOUNTER — Other Ambulatory Visit: Payer: Self-pay | Admitting: *Deleted

## 2013-06-15 ENCOUNTER — Encounter: Payer: Self-pay | Admitting: Internal Medicine

## 2013-06-15 ENCOUNTER — Ambulatory Visit (INDEPENDENT_AMBULATORY_CARE_PROVIDER_SITE_OTHER): Payer: Medicare Other | Admitting: Internal Medicine

## 2013-06-15 VITALS — BP 130/77 | HR 70 | Temp 97.9°F | Wt 206.8 lb

## 2013-06-15 DIAGNOSIS — E119 Type 2 diabetes mellitus without complications: Secondary | ICD-10-CM

## 2013-06-15 DIAGNOSIS — I4891 Unspecified atrial fibrillation: Secondary | ICD-10-CM

## 2013-06-15 DIAGNOSIS — I10 Essential (primary) hypertension: Secondary | ICD-10-CM

## 2013-06-15 MED ORDER — PANTOPRAZOLE SODIUM 40 MG PO TBEC: DELAYED_RELEASE_TABLET | ORAL | Status: DC

## 2013-06-15 MED ORDER — THERA M PLUS PO TABS: 1.0000 | ORAL_TABLET | Freq: Every day | ORAL | Status: DC

## 2013-06-15 MED ORDER — FUROSEMIDE 40 MG PO TABS: 60.0000 mg | ORAL_TABLET | Freq: Two times a day (BID) | ORAL | Status: DC

## 2013-06-15 MED ORDER — AMLODIPINE BESYLATE 10 MG PO TABS: 10.0000 mg | ORAL_TABLET | Freq: Every day | ORAL | Status: DC

## 2013-06-15 MED ORDER — LOSARTAN POTASSIUM 100 MG PO TABS: 100.0000 mg | ORAL_TABLET | Freq: Every day | ORAL | Status: DC

## 2013-06-15 MED ORDER — SITAGLIPTIN PHOSPHATE 100 MG PO TABS: 100.0000 mg | ORAL_TABLET | Freq: Every day | ORAL | Status: DC

## 2013-06-15 MED ORDER — NIACIN-LOVASTATIN ER 1000-20 MG PO TB24: 1.0000 | ORAL_TABLET | Freq: Every day | ORAL | Status: DC

## 2013-06-15 MED ORDER — GLUCOSE BLOOD VI STRP: ORAL_STRIP | Status: DC

## 2013-06-15 MED ORDER — ZOLPIDEM TARTRATE 10 MG PO TABS: 10.0000 mg | ORAL_TABLET | Freq: Every evening | ORAL | Status: DC | PRN

## 2013-06-15 NOTE — Progress Notes (Signed)
  Subjective:    Patient ID: Bradley [name], male    DOB: [birth year], 72 y.o.   MRN: [id number]  HPI Routine office visit Diabetes-- d/c glyburide d/t last A1c of 5.9, concern about stopping the medication, patient is counseled. Occasional headaches, would like to know what to do. This is going on for about a year, is usually from the base of the neck radiating up, associated with emotional distress, decreased with cold compress. Atrial fibrillation, off Coumadin due to intracranial bleeding, patient concerned about it.  Past Medical History:   06-2012, intracranial bleed, d./c coumadin   HTN (at some point hard to control, saw Dr Lowell Guitar)   AODM dx 1999   Anxiety   BPH---saw Dr [name] 2004 , normal renal u/s   INSOMNIA, CHRONIC   CV:  ---ATRIAL FIBRILLATION, PAROXYSMAL (Dr Ladona Ridgel) ; off coumadin 01-980   ---03-2007.Marland KitchenMarland KitchenMarland KitchenStress test neg, ECHO 55%   ---04-2007: carciac cath w/ no high grade stenosis, + P-HTN   ---CHF   ---s/p AV node ablation & BiV PPM implantation 09/08/11   PULMONARY  ---2008: h/o recurrent loculated right plural effusion, s/p eval Dr [name] , Dr Laneta Simmers. W/U included thoracentesis   --09-2011 aCT of the chest, several lymphadenopathies. Sees pulmonary   Leg ulcer, dx end of 2011, ABI neg 10-2010, saw vascular (Dr Arbie Cookey 10-2010) -- venous insuff. , conservative treatment    Past Surgical History  Procedure Laterality Date  . Pleural scarification    . Lung decortication    . Colonoscopy  03/10/11    normal  . Pneumothorax with fibrothorax  ~ 2010  . Insert / replace / remove pacemaker  09/08/11    pacemaker placement  . Tonsillectomy      "as a kid" (09/27/2012)  . Cardiac catheterization      "couple times; never had balloon or stent" (09/27/2012)    Family History  Problem Relation Age of Onset  . Diabetes Father   . Breast cancer Maternal Aunt   . Prostate cancer Neg Hx   . Colon cancer Neg Hx   . Cancer Neg Hx     colon or prostate  . Coronary artery  disease Father   . Polycythemia Mother      Social History:   Married, 1 son, moved from [place] 2003, lives with his wife.   Tobacco-- quit in the 80s. (used to smoke 2.5 ppd for 25 years)   Alcohol-- very rarely   No drugs.    Review of Systems Denies any upper or lower extremity paresthesias.     Objective:   Physical Exam General -- alert, well-developed, NAD.   Lungs -- normal respiratory effort, no intercostal retractions, no accessory muscle use, and normal breath sounds.  Heart--   Rate seems regular  Extremities-- no pretibial edema bilaterally  Psych-- Cognition and judgment appear intact. Alert and cooperative with normal attention span and concentration. not anxious appearing and not depressed appearing.       Assessment & Plan:  occ HAs, ok tylenol and continue w/  Cold compress   Today , I spent more than 15 min with the patient, >50% of the time counseling, and /or reviewing the chart

## 2013-06-15 NOTE — Assessment & Plan Note (Signed)
Well controlled 

## 2013-06-15 NOTE — Assessment & Plan Note (Signed)
Off Coumadin since had a intracranial bleed 06-2012 Not taking aspirin either. Plan: We'll discuss neurology, restart aspirin?

## 2013-06-15 NOTE — Patient Instructions (Addendum)
Tylenol  500 mg OTC 2 tabs a day every 8 hours as needed for pain Call if blood sugar are more than 180 Next visit in 2-3 months FASTING

## 2013-06-15 NOTE — Assessment & Plan Note (Addendum)
Based on last hemoglobin A1c of 5.9 we discontinue glyburide, has been off for about 2 months. Pt somehom concerned about not taking amaryl, counseled Plan: Return to the office in 2 or 3 months, will check the A1c  then

## 2013-06-16 ENCOUNTER — Encounter: Payer: Self-pay | Admitting: Internal Medicine

## 2013-07-17 ENCOUNTER — Encounter: Payer: Self-pay | Admitting: *Deleted

## 2013-07-28 ENCOUNTER — Emergency Department (HOSPITAL_BASED_OUTPATIENT_CLINIC_OR_DEPARTMENT_OTHER)
Admission: EM | Admit: 2013-07-28 | Discharge: 2013-07-28 | Disposition: A | Discharge status: Home / Self Care | Payer: Medicare Other | Attending: Emergency Medicine | Admitting: Emergency Medicine

## 2013-07-28 ENCOUNTER — Emergency Department (HOSPITAL_BASED_OUTPATIENT_CLINIC_OR_DEPARTMENT_OTHER): Payer: Medicare Other

## 2013-07-28 ENCOUNTER — Encounter (HOSPITAL_BASED_OUTPATIENT_CLINIC_OR_DEPARTMENT_OTHER): Payer: Self-pay | Admitting: Emergency Medicine

## 2013-07-28 DIAGNOSIS — W010XXA Fall on same level from slipping, tripping and stumbling without subsequent striking against object, initial encounter: Secondary | ICD-10-CM | POA: Insufficient documentation

## 2013-07-28 DIAGNOSIS — I209 Angina pectoris, unspecified: Secondary | ICD-10-CM | POA: Insufficient documentation

## 2013-07-28 DIAGNOSIS — I10 Essential (primary) hypertension: Secondary | ICD-10-CM | POA: Insufficient documentation

## 2013-07-28 DIAGNOSIS — Z87448 Personal history of other diseases of urinary system: Secondary | ICD-10-CM | POA: Insufficient documentation

## 2013-07-28 DIAGNOSIS — Z8701 Personal history of pneumonia (recurrent): Secondary | ICD-10-CM | POA: Insufficient documentation

## 2013-07-28 DIAGNOSIS — Y9289 Other specified places as the place of occurrence of the external cause: Secondary | ICD-10-CM | POA: Insufficient documentation

## 2013-07-28 DIAGNOSIS — I2510 Atherosclerotic heart disease of native coronary artery without angina pectoris: Secondary | ICD-10-CM | POA: Insufficient documentation

## 2013-07-28 DIAGNOSIS — Z9861 Coronary angioplasty status: Secondary | ICD-10-CM | POA: Insufficient documentation

## 2013-07-28 DIAGNOSIS — S8391XA Sprain of unspecified site of right knee, initial encounter: Secondary | ICD-10-CM

## 2013-07-28 DIAGNOSIS — Z950 Presence of cardiac pacemaker: Secondary | ICD-10-CM | POA: Insufficient documentation

## 2013-07-28 DIAGNOSIS — Z9981 Dependence on supplemental oxygen: Secondary | ICD-10-CM | POA: Insufficient documentation

## 2013-07-28 DIAGNOSIS — Z8739 Personal history of other diseases of the musculoskeletal system and connective tissue: Secondary | ICD-10-CM | POA: Insufficient documentation

## 2013-07-28 DIAGNOSIS — E119 Type 2 diabetes mellitus without complications: Secondary | ICD-10-CM | POA: Insufficient documentation

## 2013-07-28 DIAGNOSIS — D649 Anemia, unspecified: Secondary | ICD-10-CM | POA: Insufficient documentation

## 2013-07-28 DIAGNOSIS — Y9301 Activity, walking, marching and hiking: Secondary | ICD-10-CM | POA: Insufficient documentation

## 2013-07-28 DIAGNOSIS — R011 Cardiac murmur, unspecified: Secondary | ICD-10-CM | POA: Insufficient documentation

## 2013-07-28 DIAGNOSIS — Z87891 Personal history of nicotine dependence: Secondary | ICD-10-CM | POA: Insufficient documentation

## 2013-07-28 DIAGNOSIS — I4891 Unspecified atrial fibrillation: Secondary | ICD-10-CM | POA: Insufficient documentation

## 2013-07-28 DIAGNOSIS — Z872 Personal history of diseases of the skin and subcutaneous tissue: Secondary | ICD-10-CM | POA: Insufficient documentation

## 2013-07-28 DIAGNOSIS — Z79899 Other long term (current) drug therapy: Secondary | ICD-10-CM | POA: Insufficient documentation

## 2013-07-28 DIAGNOSIS — I509 Heart failure, unspecified: Secondary | ICD-10-CM | POA: Insufficient documentation

## 2013-07-28 DIAGNOSIS — IMO0002 Reserved for concepts with insufficient information to code with codable children: Secondary | ICD-10-CM | POA: Insufficient documentation

## 2013-07-28 DIAGNOSIS — I2789 Other specified pulmonary heart diseases: Secondary | ICD-10-CM | POA: Insufficient documentation

## 2013-07-28 DIAGNOSIS — Z8669 Personal history of other diseases of the nervous system and sense organs: Secondary | ICD-10-CM | POA: Insufficient documentation

## 2013-07-28 DIAGNOSIS — Z8709 Personal history of other diseases of the respiratory system: Secondary | ICD-10-CM | POA: Insufficient documentation

## 2013-07-28 MED ORDER — OXYCODONE-ACETAMINOPHEN 5-325 MG PO TABS: 1.0000 | ORAL_TABLET | Freq: Four times a day (QID) | ORAL | Status: DC | PRN

## 2013-07-28 MED ORDER — OXYCODONE-ACETAMINOPHEN 5-325 MG PO TABS
1.0000 | ORAL_TABLET | Freq: Once | ORAL | Status: AC
  Administered 2013-07-28: 1 via ORAL
  Filled 2013-07-28: qty 1

## 2013-07-28 NOTE — ED Provider Notes (Signed)
I saw and evaluated the patient, reviewed the resident's note and I agree with the findings and plan.   .Face to face Exam:  General:  Awake HEENT:  Atraumatic Resp:  Normal effort Abd:  Nondistended Neuro:No focal weakness Lymph: No adenopathy   Nelia Shi, MD 07/28/13 2311

## 2013-07-28 NOTE — ED Provider Notes (Signed)
CSN: [id number]     Arrival date & time 07/28/13  1850 History   First MD Initiated Contact with Patient 07/28/13 1920     Chief Complaint  Patient presents with  . Leg Injury   (Consider location/radiation/quality/duration/timing/severity/associated sxs/prior Treatment) HPI  Patient is a 72 yo male with history of HTN, HLD, CHF who presents s/p fall with right knee pain. Patient was walking his dog down a grassy hill and slipped. He felt as though he hyperextended his right knee. He noted pain at time of injury, though noted no popping or clicking. He placed ice on it and rested his knee. It began to hurt more and swell in the early afternoon. Now it is tender and he is limping when trying to walk. Denies bruising. Has not taken anything for the pain.  Past Medical History  Diagnosis Date  . HTN (hypertension)   . HLD (hyperlipidemia)   . BPH (benign prostatic hyperplasia)     Saw Dr [name] 2004, normal renal u/s  . Leg ulcer 2011  . CHF (congestive heart failure)     Thought primarily to be non-systolic although EF down (EF 40-98% 12/2010, down to 35-40% 09/05/11), cath 2008 with no CAD, nuclear study 07/2011 showing Small area of reversibility in the distal ant/lat wall the left ventricle suspicious for ischemia/septal wall HK but felt to be low risk  (per D/C Summary 07/2011)  . Pleural effusion 2008    S/p decortication  . Insomnia   . Atrial fibrillation     s/p AV node ablation & BiV PPM implantation 09/08/11 (op dictation pending)  . Sinoatrial node dysfunction   . Pulmonary HTN   . Coronary artery disease   . Anginal pain   . Peripheral vascular disease   . Anemia     intermittent  . Pacemaker   . Heart murmur   . Pneumonia     "today and often when I worked in [place]; had it once/yr X 5 yr then" (09/27/2012)  . Shortness of breath     "all the time now" (09/27/2012)  . Type II diabetes mellitus 1999  . Daily headache     "sometimes" (09/27/2012)  . Migraines   .  Arthritis     "?back" (09/27/2012)  . On home oxygen therapy     "sleep w/2L q hs" (09/27/2012)   Past Surgical History  Procedure Laterality Date  . Pleural scarification    . Lung decortication    . Colonoscopy  03/10/11    normal  . Pneumothorax with fibrothorax  ~ 2010  . Insert / replace / remove pacemaker  09/08/11    pacemaker placement  . Tonsillectomy      "as a kid" (09/27/2012)  . Cardiac catheterization      "couple times; never had balloon or stent" (09/27/2012)   Family History  Problem Relation Age of Onset  . Diabetes Father   . Breast cancer Maternal Aunt   . Prostate cancer Neg Hx   . Colon cancer Neg Hx   . Cancer Neg Hx     colon or prostate  . Coronary artery disease Father   . Polycythemia Mother    History  Substance Use Topics  . Smoking status: Former Smoker -- 2.50 packs/day for 25 years    Types: Cigarettes    Quit date: 10/11/1980  . Smokeless tobacco: Never Used  . Alcohol Use: Yes     Comment: 09/27/2012 "occasionally once a year; if that"  Review of Systems  Musculoskeletal: Positive for arthralgias (right knee), gait problem (limping due to pain) and joint swelling (right knee).  Neurological: Negative for weakness and numbness.    Allergies  Hydrocodone; Tramadol; Tadalafil; and Avelox  Home Medications   Current Outpatient Rx  Name  Route  Sig  Dispense  Refill  . amLODipine (NORVASC) 10 MG tablet   Oral   Take 1 tablet (10 mg total) by mouth daily.   90 tablet   1   . carvedilol (COREG) 25 MG tablet      TAKE 1 AND 1/2 TABLETS BY MOUTH TWICE DAILY WITH A MEAL   270 tablet   1   . escitalopram (LEXAPRO) 10 MG tablet   Oral   Take 1 tablet (10 mg total) by mouth at bedtime.   90 tablet   1   . furosemide (LASIX) 40 MG tablet   Oral   Take 1.5 tablets (60 mg total) by mouth 2 (two) times daily.   270 tablet   1   . glucose blood (ONE TOUCH ULTRA TEST) test strip      USE AS DIRECTED   3 each   3     Dx:  250.00 Check once daily.   Marland Kitchen losartan (COZAAR) 100 MG tablet   Oral   Take 1 tablet (100 mg total) by mouth daily.   90 tablet   1   . Multiple Vitamins-Minerals (MULTIVITAMINS THER. W/MINERALS) TABS tablet   Oral   Take 1 tablet by mouth daily.   30 each   5   . niacin-lovastatin (ADVICOR) 1000-20 MG 24 hr tablet   Oral   Take 1 tablet by mouth at bedtime.   90 tablet   1   . nitroGLYCERIN (NITROSTAT) 0.4 MG SL tablet   Sublingual   Place 1 tablet (0.4 mg total) under the tongue every 5 (five) minutes as needed for chest pain.   90 tablet   0   . pantoprazole (PROTONIX) 40 MG tablet      TAKE 1 TABLET (40 MG TOTAL) BY MOUTH EVERY MORNING   90 tablet   1   . sitaGLIPtin (JANUVIA) 100 MG tablet   Oral   Take 1 tablet (100 mg total) by mouth daily.   90 tablet   1   . zolpidem (AMBIEN) 10 MG tablet   Oral   Take 1 tablet (10 mg total) by mouth at bedtime as needed. For sleep   90 tablet   1    BP 124/61  Pulse 73  Temp(Src) 98 F (36.7 C) (Oral)  Resp 16  Ht 5\' 9"  (1.753 m)  Wt 197 lb (89.359 kg)  BMI 29.08 kg/m2  SpO2 100% Physical Exam  Constitutional: He appears well-developed and well-nourished.  HENT:  Head: Normocephalic and atraumatic.  Mouth/Throat: Oropharynx is clear and moist.  Eyes: Pupils are equal, round, and reactive to light.  Cardiovascular: Normal rate, regular rhythm and normal heart sounds.   2+ DP pulses  Pulmonary/Chest: Effort normal and breath sounds normal.  Musculoskeletal:  Right knee with effusion superior to the patella, normal ROM, MCL and LCL with no laxity, anterior drawer and posterior drawer with no laxity, mcmurrays negative, no joint line tenderness, posterior lateral proximal calf tender to palpation Left knee with no effusion, normal ROM, no MCL or LCL laxity, anterior and posterior drawer signs negative, negative mcmurrays, no joint line tenderness  Neurological:  5/5 strength in hip flexors, quads, and  hamstrings bilaterally, sensation to light touch intact bilaterally in LE    ED Course  Procedures (including critical care time) Labs Review Labs Reviewed - No data to display Imaging Review Dg Knee Complete 4 Views Right  07/28/2013   CLINICAL DATA:  Fall, right knee pain  EXAM: RIGHT KNEE - COMPLETE 4+ VIEW  COMPARISON:  None.  FINDINGS: There is no evidence of fracture, dislocation, or joint effusion. There is no evidence of arthropathy or other focal bone abnormality. Soft tissues are unremarkable.  IMPRESSION: No fracture or dislocation.   Electronically Signed   By: Genevive Bi M.D.   On: 07/28/2013 19:55    EKG Interpretation   None       MDM   1. Right knee sprain, initial encounter    Patient seen and examined. Joint appears stable and without bony abnormalities on XR. Most likely has small muscle or tendon strain in right knee resulting from hyperextension. Will place in knee immobilizer and advise rest with bathroom privileges tomorrow. Will need to continue rest, ice, elevation, and knee immobilizer for the next week and then as needed. Pain control with percocet 5-325 #10 given. Patient given return precautions.  This patient was discussed and seen with my attending Dr. Radford Pax.  Marikay Alar, MD [facility] PGY-2 07/28/13 10:50 pm    Glori Luis, MD 07/28/13 2251

## 2013-07-28 NOTE — ED Notes (Signed)
Pt fell today while walking the dog.  Slipped on wet grass and hyperflexed right knee. Pt having pain in right knee and right calf.

## 2013-07-31 ENCOUNTER — Encounter: Payer: Self-pay | Admitting: Internal Medicine

## 2013-07-31 ENCOUNTER — Ambulatory Visit (INDEPENDENT_AMBULATORY_CARE_PROVIDER_SITE_OTHER): Payer: Medicare Other | Admitting: Internal Medicine

## 2013-07-31 VITALS — BP 108/67 | HR 81 | Ht 69.5 in | Wt 206.4 lb

## 2013-07-31 DIAGNOSIS — I509 Heart failure, unspecified: Secondary | ICD-10-CM

## 2013-07-31 DIAGNOSIS — I428 Other cardiomyopathies: Secondary | ICD-10-CM

## 2013-07-31 DIAGNOSIS — Z950 Presence of cardiac pacemaker: Secondary | ICD-10-CM

## 2013-07-31 DIAGNOSIS — I5023 Acute on chronic systolic (congestive) heart failure: Secondary | ICD-10-CM

## 2013-07-31 DIAGNOSIS — I4891 Unspecified atrial fibrillation: Secondary | ICD-10-CM

## 2013-07-31 DIAGNOSIS — I5042 Chronic combined systolic (congestive) and diastolic (congestive) heart failure: Secondary | ICD-10-CM

## 2013-07-31 LAB — PACEMAKER DEVICE OBSERVATION
AL IMPEDENCE PM: 4047 Ohm
ATRIAL PACING PM: 0
BATTERY VOLTAGE: 3.0119 V
LV LEAD THRESHOLD: 1 V
RV LEAD AMPLITUDE: 5.125 mv
RV LEAD IMPEDENCE PM: 608 Ohm
RV LEAD THRESHOLD: 0.75 V
VENTRICULAR PACING PM: 98.67

## 2013-07-31 NOTE — Assessment & Plan Note (Signed)
His Medtronic biventricular pacemaker is working normally. We'll plan to recheck in several months. 

## 2013-07-31 NOTE — Patient Instructions (Signed)
Your physician wants you to follow-up in: 6 months in the device clinic and 12 months with Dr Taylor You will receive a reminder letter in the mail two months in advance. If you don't receive a letter, please call our office to schedule the follow-up appointment.  

## 2013-07-31 NOTE — Progress Notes (Signed)
HPI Mr. Sitter returns today for followup. He is a 72 year old man with chronic atrial fibrillation, nonischemic cardiomyopathy, status post AV node ablation and biventricular pacemaker insertion. In the interim, the patient has done better. His heart failure symptoms are improved. He notes that his weight is controlled. When asked what the change is as effective these results, he notes that his wife has "cut off his salt." in addition, the patient's son is now living with him and does not drive. The patient has been driving his son back and forth to work. Allergies  Allergen Reactions  . Hydrocodone Other (See Comments)    "given to him in the hospital; went thru withdrawals once home; dr said not to take it again" (09/27/2012)  . Tramadol Other (See Comments)    "given to him in the hospital; went thru withdrawals once home; dr said not to take it again" (09/27/2012)  . Tadalafil Other (See Comments)    headache, backache  . Avelox [Moxifloxacin Hydrochloride] Itching and Other (See Comments)    Headache     Current Outpatient Prescriptions  Medication Sig Dispense Refill  . amLODipine (NORVASC) 10 MG tablet Take 10 mg by mouth daily. (Pt only taking as needed)      . carvedilol (COREG) 25 MG tablet TAKE 1 AND 1/2 TABLETS BY MOUTH TWICE DAILY WITH A MEAL  270 tablet  1  . escitalopram (LEXAPRO) 10 MG tablet Take 1 tablet (10 mg total) by mouth at bedtime.  90 tablet  1  . furosemide (LASIX) 40 MG tablet Take 1.5 tablets (60 mg total) by mouth 2 (two) times daily.  270 tablet  1  . glucose blood (ONE TOUCH ULTRA TEST) test strip USE AS DIRECTED  3 each  3  . losartan (COZAAR) 100 MG tablet Take 1 tablet (100 mg total) by mouth daily.  90 tablet  1  . Multiple Vitamins-Minerals (MULTIVITAMINS THER. W/MINERALS) TABS tablet Take 1 tablet by mouth daily.  30 each  5  . niacin-lovastatin (ADVICOR) 1000-20 MG 24 hr tablet Take 1 tablet by mouth at bedtime.  90 tablet  1  . nitroGLYCERIN  (NITROSTAT) 0.4 MG SL tablet Place 1 tablet (0.4 mg total) under the tongue every 5 (five) minutes as needed for chest pain.  90 tablet  0  . oxyCODONE-acetaminophen (PERCOCET/ROXICET) 5-325 MG per tablet Take 1 tablet by mouth every 6 (six) hours as needed for pain.  10 tablet  0  . pantoprazole (PROTONIX) 40 MG tablet TAKE 1 TABLET (40 MG TOTAL) BY MOUTH EVERY MORNING  90 tablet  1  . sitaGLIPtin (JANUVIA) 100 MG tablet Take 1 tablet (100 mg total) by mouth daily.  90 tablet  1  . zolpidem (AMBIEN) 10 MG tablet Take 1 tablet (10 mg total) by mouth at bedtime as needed. For sleep  90 tablet  1   No current facility-administered medications for this visit.     Past Medical History  Diagnosis Date  . HTN (hypertension)   . HLD (hyperlipidemia)   . BPH (benign prostatic hyperplasia)     Saw Dr [name] 2004, normal renal u/s  . Leg ulcer 2011  . CHF (congestive heart failure)     Thought primarily to be non-systolic although EF down (EF 16-10% 12/2010, down to 35-40% 09/05/11), cath 2008 with no CAD, nuclear study 07/2011 showing Small area of reversibility in the distal ant/lat wall the left ventricle suspicious for ischemia/septal wall HK but felt to be low risk  (per  D/C Summary 07/2011)  . Pleural effusion 2008    S/p decortication  . Insomnia   . Atrial fibrillation     s/p AV node ablation & BiV PPM implantation 09/08/11 (op dictation pending)  . Sinoatrial node dysfunction   . Pulmonary HTN   . Coronary artery disease   . Anginal pain   . Peripheral vascular disease   . Anemia     intermittent  . Pacemaker   . Heart murmur   . Pneumonia     "today and often when I worked in [place]; had it once/yr X 5 yr then" (09/27/2012)  . Shortness of breath     "all the time now" (09/27/2012)  . Type II diabetes mellitus 1999  . Daily headache     "sometimes" (09/27/2012)  . Migraines   . Arthritis     "?back" (09/27/2012)  . On home oxygen therapy     "sleep w/2L q hs" (09/27/2012)     ROS:   All systems reviewed and negative except as noted in the HPI.   Past Surgical History  Procedure Laterality Date  . Pleural scarification    . Lung decortication    . Colonoscopy  03/10/11    normal  . Pneumothorax with fibrothorax  ~ 2010  . Insert / replace / remove pacemaker  09/08/11    pacemaker placement  . Tonsillectomy      "as a kid" (09/27/2012)  . Cardiac catheterization      "couple times; never had balloon or stent" (09/27/2012)     Family History  Problem Relation Age of Onset  . Diabetes Father   . Breast cancer Maternal Aunt   . Prostate cancer Neg Hx   . Colon cancer Neg Hx   . Cancer Neg Hx     colon or prostate  . Coronary artery disease Father   . Polycythemia Mother      History   Social History  . Marital Status: Married    Spouse Name: [place]    Number of Children: 1  . 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 Smoking status: Former Smoker -- 2.50 packs/day for 25 years    Types: Cigarettes    Quit date: 10/11/1980  . Smokeless tobacco: Never Used  . Alcohol Use: Yes     Comment: 09/27/2012 "occasionally once a year; if that"  . Drug Use: No  . Sexual Activity: No   Other Topics Concern  . 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 HEART:  Regular rate rhythm, no murmurs, no rubs, no clicks Abd:  soft, positive bowel sounds, no organomegally, no rebound, no guarding Ext:  2 plus pulses, no edema, no cyanosis, no clubbing Skin:  No rashes no nodules Neuro:  CN II through XII intact, motor grossly intact  EKG - atrial fibrillation with  biventricular pacing  DEVICE  Normal device function.  See PaceArt for details.   Assess/Plan:

## 2013-07-31 NOTE — Assessment & Plan Note (Signed)
His ventricular rate is well controlled. He'll continue his current medical therapy. 

## 2013-07-31 NOTE — Assessment & Plan Note (Signed)
He has both chronic and acute systolic and diastolic heart failure. He is currently well compensated class II. No change in medical therapy.

## 2013-08-07 IMAGING — CT CT ANGIO CHEST
2 of 6 series · 18 of 46 positions shown · IV contrast (omnipaque)
Comparison: [HOSPITAL] chest CT exam dated 05/25/2011 the.

CLINICAL DATA: Shortness of breath

CT ANGIOGRAPHY CHEST WITH CONTRAST
TECHNIQUE: Multidetector CT imaging of the chest was performed
using the standard protocol during bolus administration of
intravenous contrast.  Multiplanar CT image reconstructions
including MIPs were obtained to evaluate the vascular anatomy.
Contrast: 100mL OMNIPAQUE IOHEXOL 300 MG/ML IV SOLN

[Series 9: pulm embolism 1.0 b25f thin · axial · 0.76mm/px · z∈[-172,+90]mm · 15 of 288 slices shown]
[im 13/288  lung]
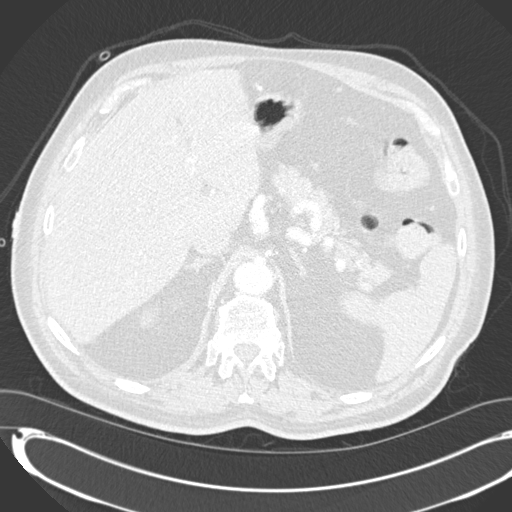
[im 38/288  soft-tissue]
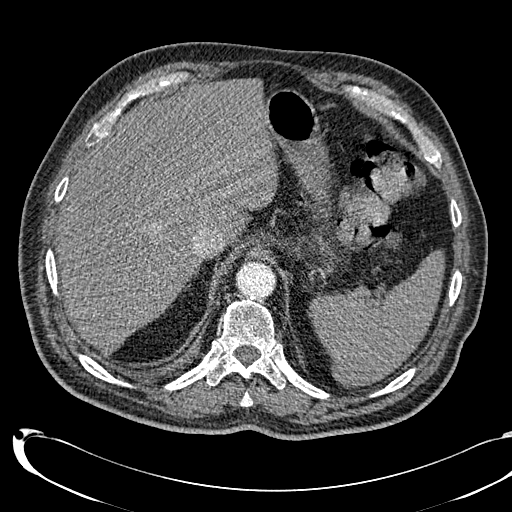
[im 50/288  lung]
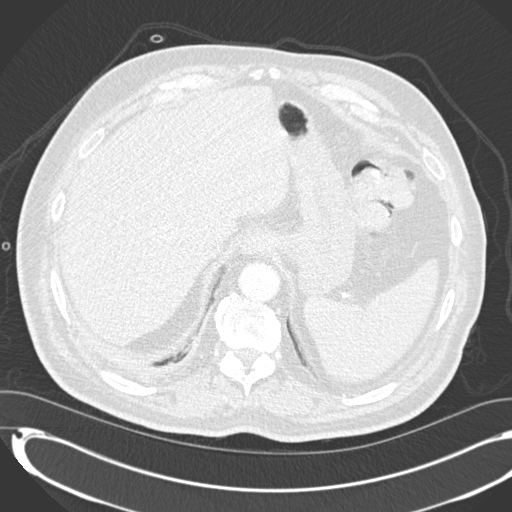
[im 75/288  soft-tissue]
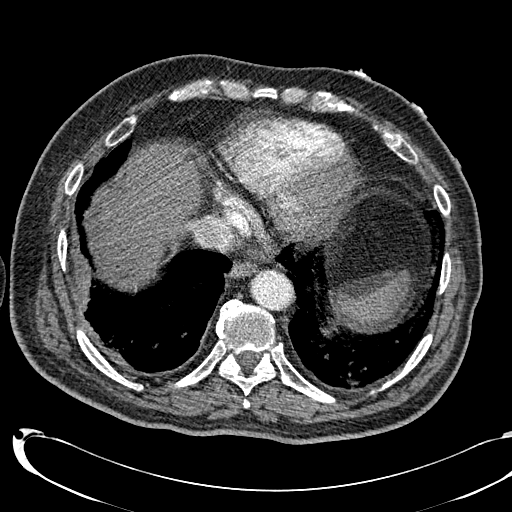
[im 88/288  lung]
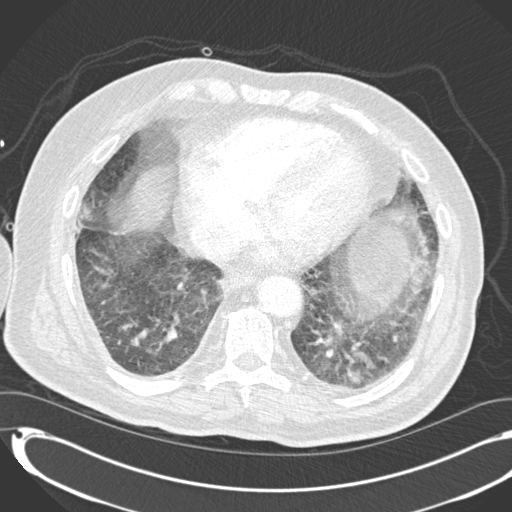
[im 113/288  soft-tissue]
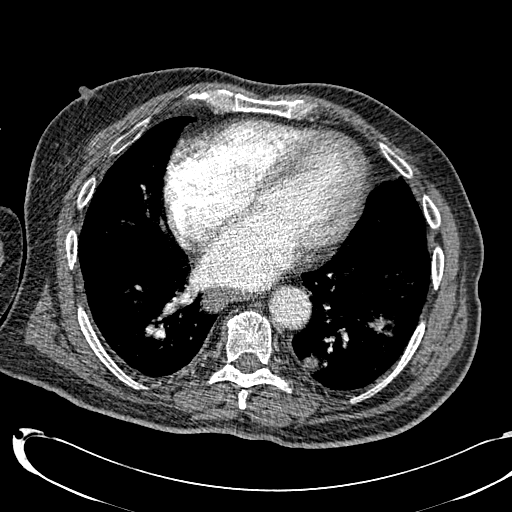
[im 125/288  lung]
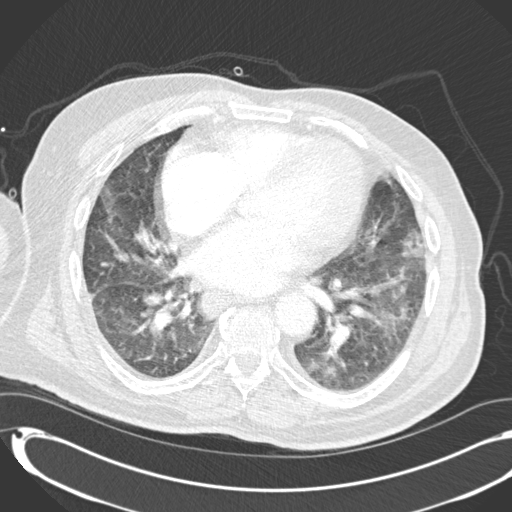
[im 150/288  soft-tissue]
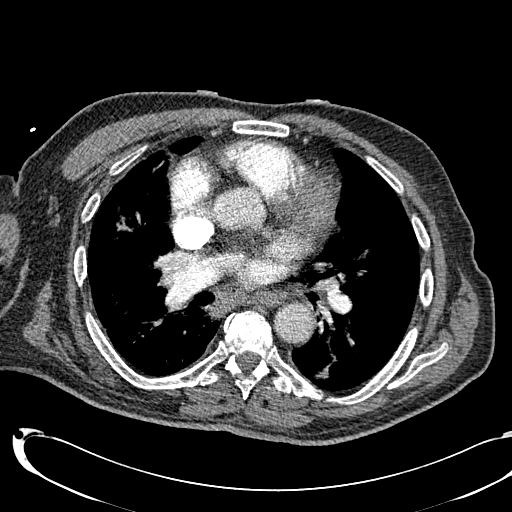
[im 163/288  lung]
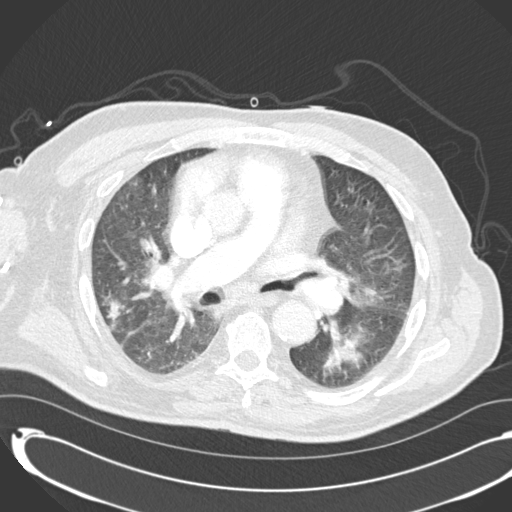
[im 175/288  soft-tissue]
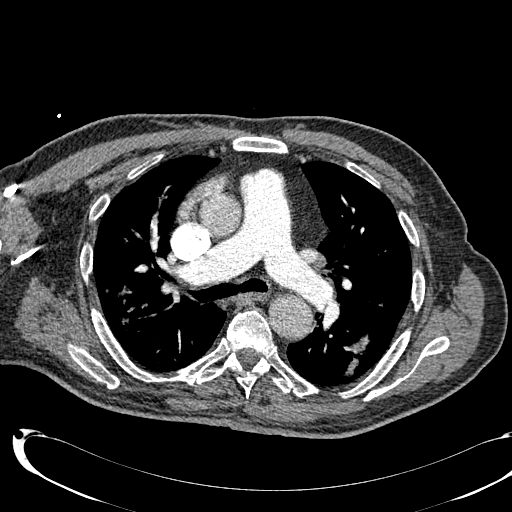
[im 200/288  lung]
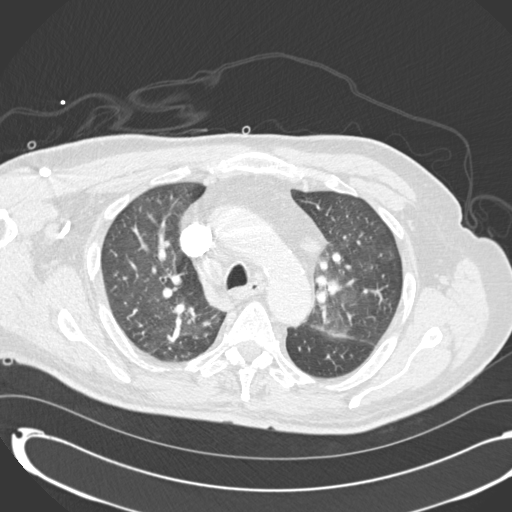
[im 213/288  soft-tissue]
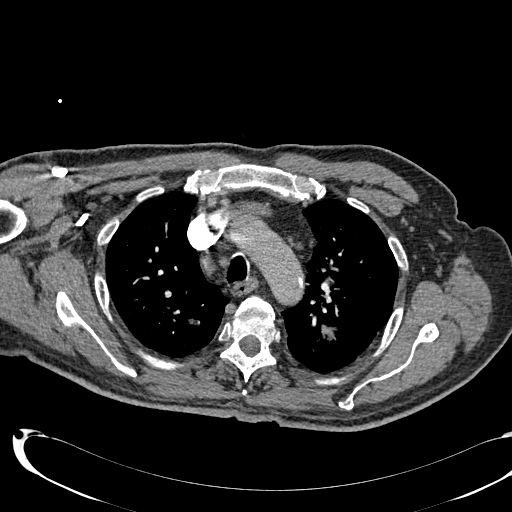
[im 238/288  lung]
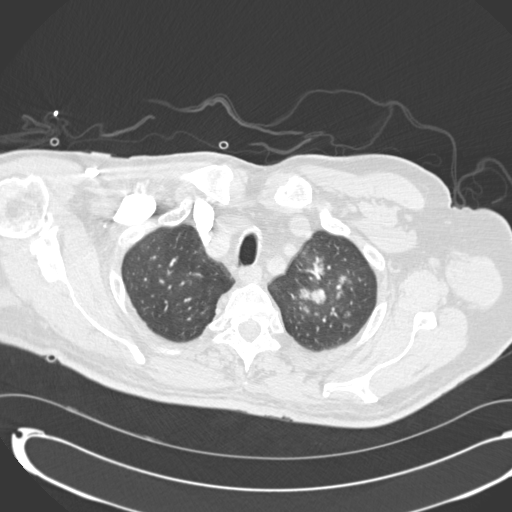
[im 250/288  soft-tissue]
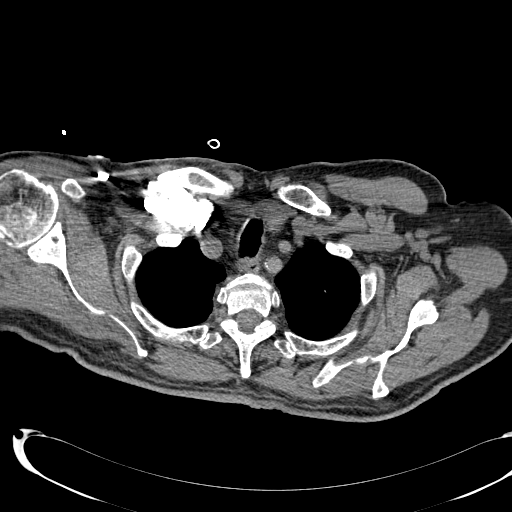
[im 275/288  lung]
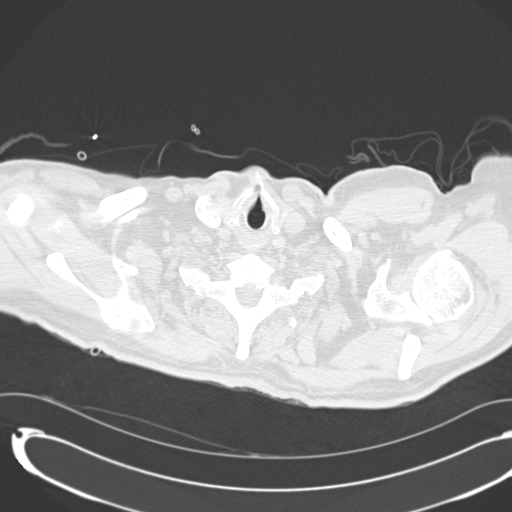

[Series 604: coronal · coronal · 0.76mm/px · 3 of 94 slices shown]
[im 24/94  soft-tissue]
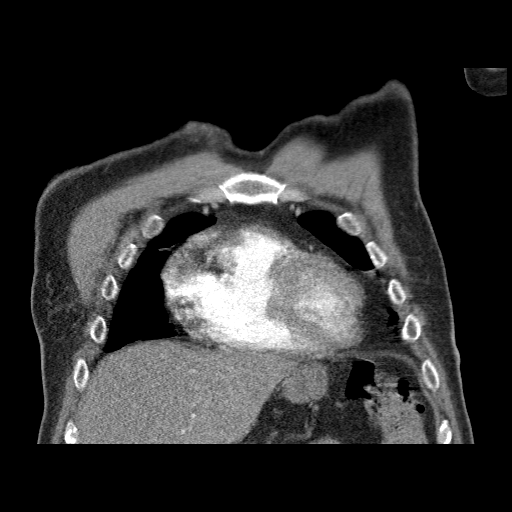
[im 47/94  soft-tissue]
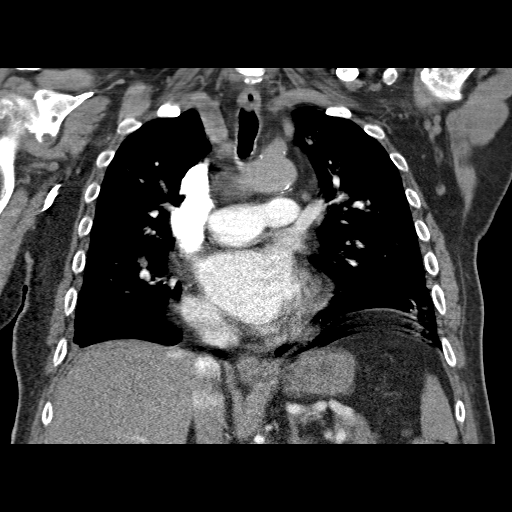
[im 70/94  soft-tissue]
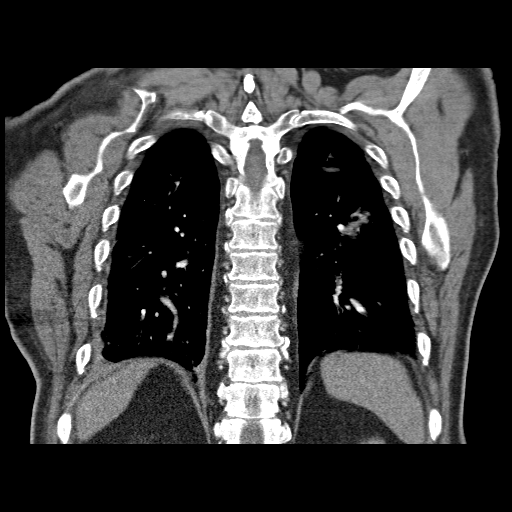

[18 of 46 positions shown; findings below may reference images not displayed]

FINDINGS: Study is obscured by breathing motion which obscures
segmental and subsegmental pulmonary arteries bilaterally.  Within
this limitation, no large central pulmonary embolus is evident.
There is no thoracic aortic aneurysm.  No evidence for aortic
dissection.

Right paratracheal lymph node measures 11 mm short axis today
compared to 13 mm on the previous study. These 15 mm short-axis AP
window lymph node measured on the previous study is not 14 mm in
short axis.  The heart is enlarged.  There is no pericardial or
pleural effusion.

Lung windows demonstrate patchy airspace disease without any
consistent distribution.  Patchy areas of alveolar opacity are seen
centrally in the upper lobes with scattered peripheral involvement
elsewhere.  There is some interlobular septal thickening and ground-
glass attenuation in the bases which may be related to edema.

Images which include the upper abdomen show a 17 mm enhancing
lesion in the right hepatic lobe on image 86.

Review of the MIP images confirms the above findings.
IMPRESSION: Motion limited study.  There is no large central pulmonary embolus
but segmental and subsegmental pulmonary arteries are not reliably
evaluated secondary to breathing motion.

Patchy bilateral airspace disease compatible with multifocal
pneumonia.  Probably some dependent interstitial edema in the
bases.

17 mm enhancing lesion in the right hepatic lobe.  This cannot be
further characterized.  MRI without and with contrast of the
abdomen could be used to further evaluate. Non-emergent MRI should
be deferred until patient has been discharged for the acute
illness, and can optimally cooperate with positioning and breath-
holding instructions.

## 2013-08-10 IMAGING — CR DG CHEST 2V
2 series · 2 of 2 positions shown · non-contrast
Comparison: Chest x-ray and chest CT 07/28/2011.

CLINICAL DATA: Shortness of breath.

CHEST - 2 VIEW

[w chest pa]
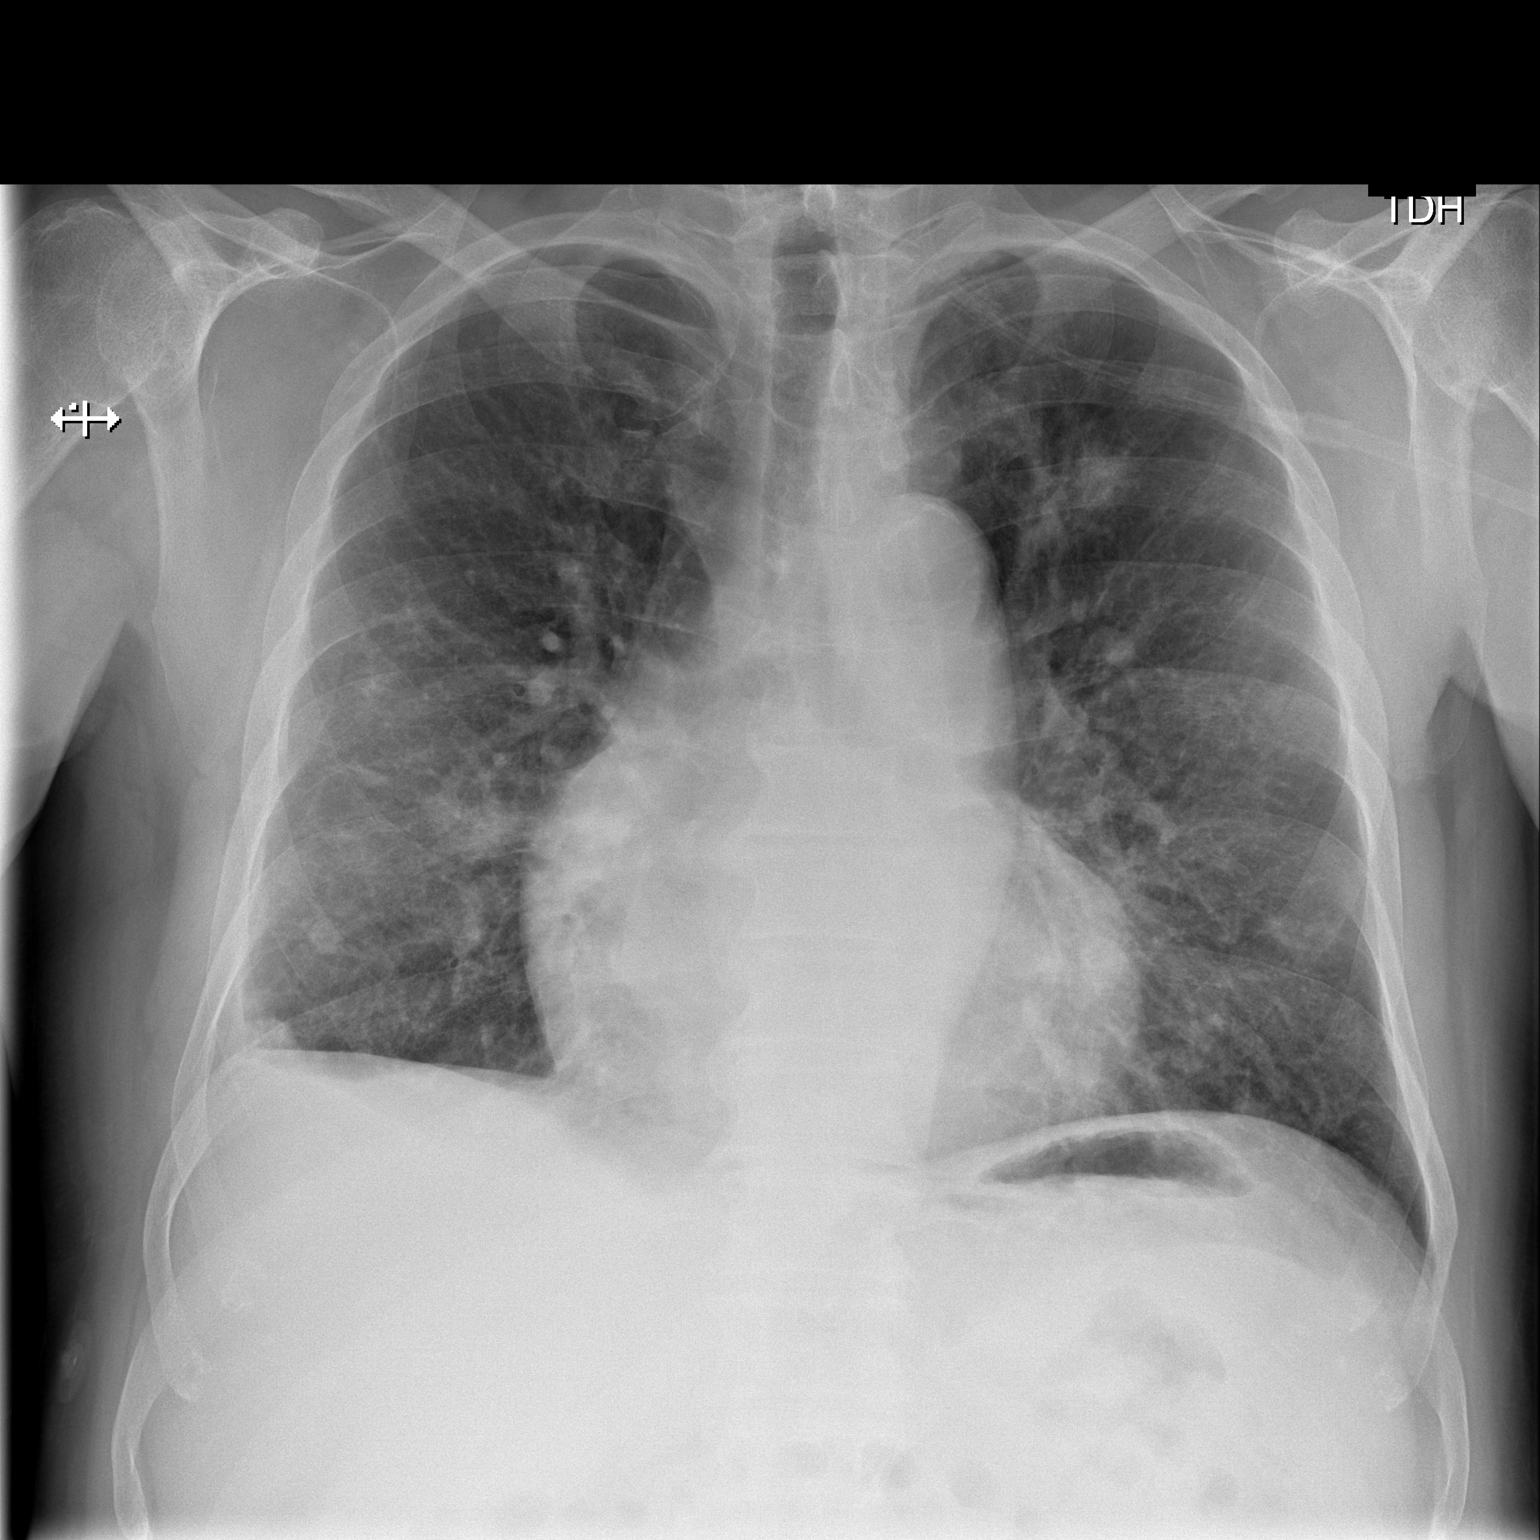

[w chest lat]
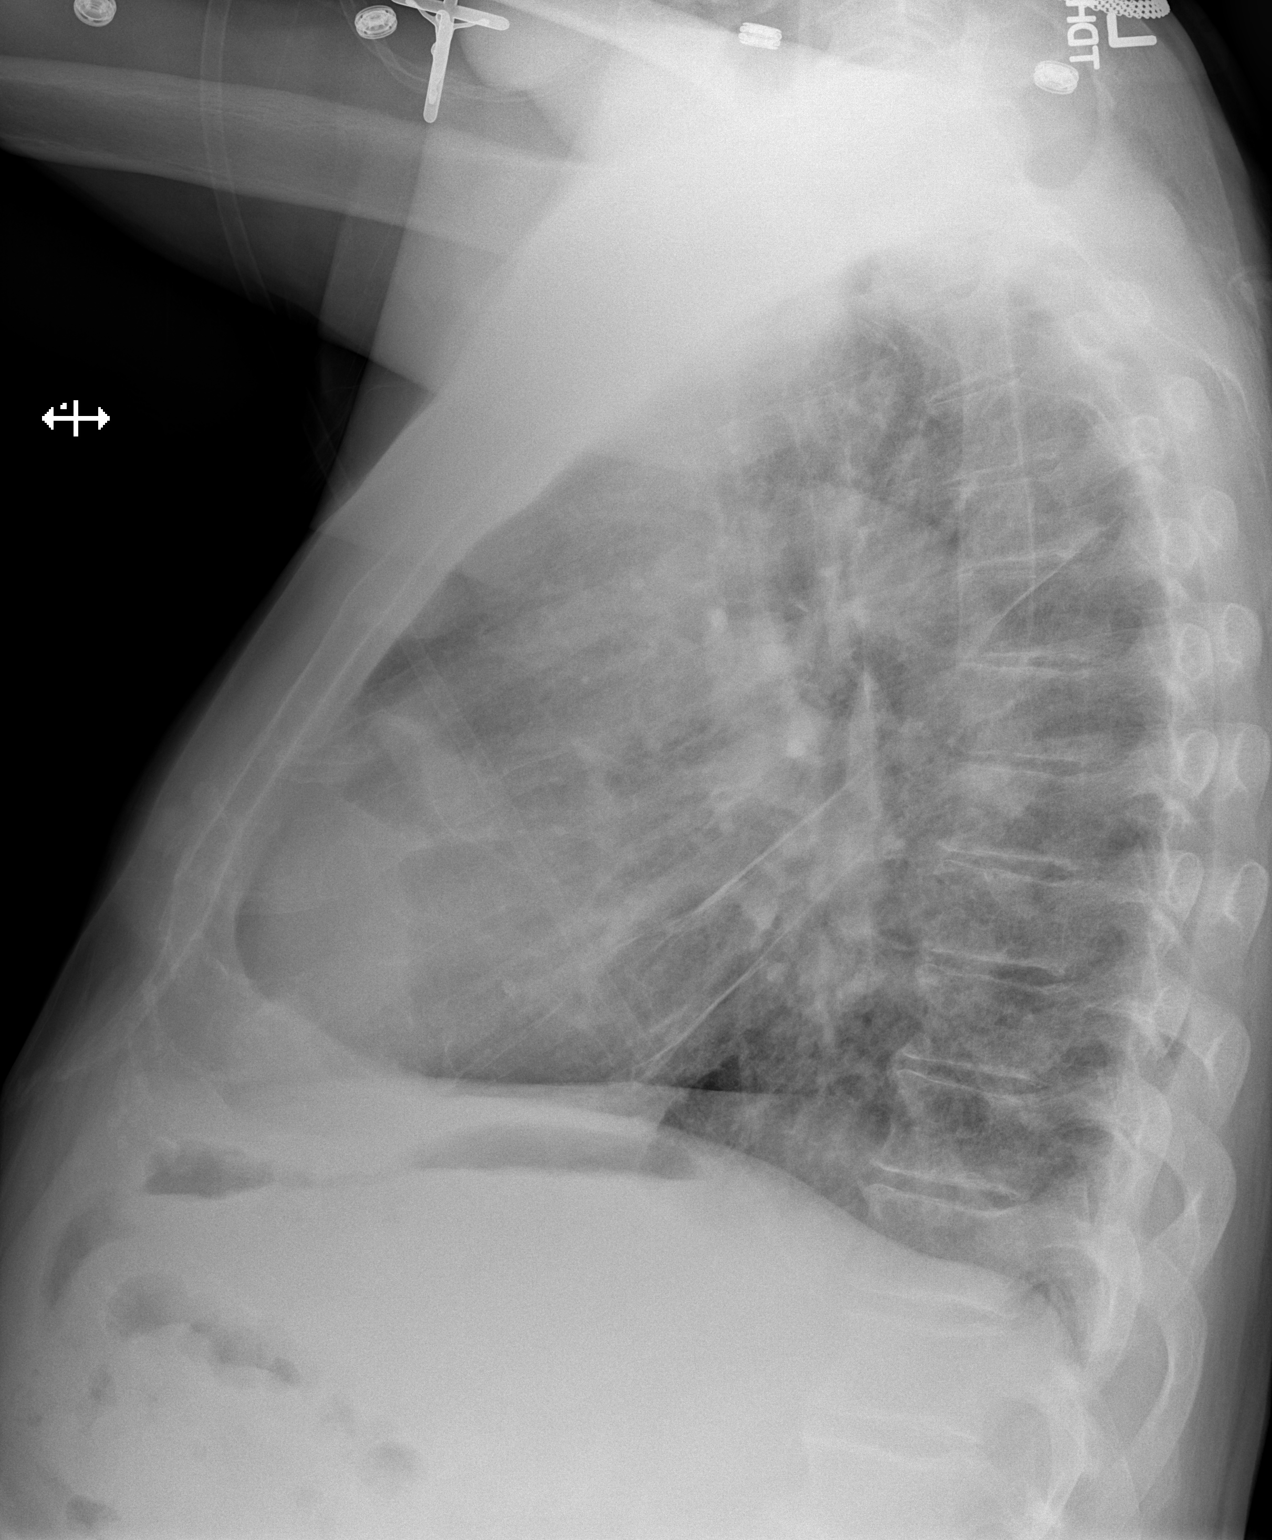

[2 of 2 positions shown; findings below may reference images not displayed]

FINDINGS: The cardiac silhouette, mediastinal and hilar contours
are stable.  The lung shows slight improved aeration with all been
nodular airspace process, likely infection.  A small right pleural
effusion persists.  No pneumothorax.
IMPRESSION: Improving nodular airspace process.

## 2013-08-16 ENCOUNTER — Other Ambulatory Visit: Payer: Self-pay

## 2013-09-14 ENCOUNTER — Ambulatory Visit (INDEPENDENT_AMBULATORY_CARE_PROVIDER_SITE_OTHER): Payer: Medicare Other | Admitting: Internal Medicine

## 2013-09-14 ENCOUNTER — Encounter: Payer: Self-pay | Admitting: Internal Medicine

## 2013-09-14 VITALS — BP 116/74 | HR 89 | Temp 97.4°F | Wt 215.0 lb

## 2013-09-14 DIAGNOSIS — I25810 Atherosclerosis of coronary artery bypass graft(s) without angina pectoris: Secondary | ICD-10-CM

## 2013-09-14 DIAGNOSIS — E119 Type 2 diabetes mellitus without complications: Secondary | ICD-10-CM

## 2013-09-14 DIAGNOSIS — I10 Essential (primary) hypertension: Secondary | ICD-10-CM

## 2013-09-14 DIAGNOSIS — Z23 Encounter for immunization: Secondary | ICD-10-CM | POA: Diagnosis not present

## 2013-09-14 DIAGNOSIS — I739 Peripheral vascular disease, unspecified: Secondary | ICD-10-CM

## 2013-09-14 DIAGNOSIS — I4891 Unspecified atrial fibrillation: Secondary | ICD-10-CM

## 2013-09-14 DIAGNOSIS — E785 Hyperlipidemia, unspecified: Secondary | ICD-10-CM

## 2013-09-14 LAB — LIPID PANEL
Cholesterol: 142 mg/dL (ref 0–200)
HDL: 48.9 mg/dL (ref >39.00)
LDL Cholesterol: 78 mg/dL (ref 0–99)
Total CHOL/HDL Ratio: 3
Triglycerides: 77 mg/dL (ref 0.0–149.0)
VLDL: 15.4 mg/dL (ref 0.0–40.0)

## 2013-09-14 LAB — COMPREHENSIVE METABOLIC PANEL
ALT: 21 U/L (ref 0–53)
AST: 22 U/L (ref 0–37)
Albumin: 3.8 g/dL (ref 3.5–5.2)
Alkaline Phosphatase: 82 U/L (ref 39–117)
BUN: 13 mg/dL (ref 6–23)
CO2: 31 mEq/L (ref 19–32)
Calcium: 8.9 mg/dL (ref 8.4–10.5)
Chloride: 102 mEq/L (ref 96–112)
Creatinine, Ser: 0.8 mg/dL (ref 0.4–1.5)
GFR: 102.3 mL/min (ref >60.00)
Glucose, Bld: 138 mg/dL — ABNORMAL HIGH (ref 70–99)
Potassium: 4 mEq/L (ref 3.5–5.1)
Sodium: 138 mEq/L (ref 135–145)
Total Bilirubin: 0.7 mg/dL (ref 0.3–1.2)
Total Protein: 7.5 g/dL (ref 6.0–8.3)

## 2013-09-14 LAB — CBC WITH DIFFERENTIAL/PLATELET
Basophils Absolute: 0 10*3/uL (ref 0.0–0.1)
Basophils Relative: 0.4 % (ref 0.0–3.0)
Eosinophils Absolute: 0.1 10*3/uL (ref 0.0–0.7)
Eosinophils Relative: 0.9 % (ref 0.0–5.0)
HCT: 41.5 % (ref 39.0–52.0)
Hemoglobin: 13.9 g/dL (ref 13.0–17.0)
Lymphocytes Relative: 21.2 % (ref 12.0–46.0)
Lymphs Abs: 1.5 10*3/uL (ref 0.7–4.0)
MCHC: 33.4 g/dL (ref 30.0–36.0)
MCV: 92.3 fl (ref 78.0–100.0)
Monocytes Absolute: 0.6 10*3/uL (ref 0.1–1.0)
Monocytes Relative: 8.4 % (ref 3.0–12.0)
Neutro Abs: 4.8 10*3/uL (ref 1.4–7.7)
Neutrophils Relative %: 69.1 % (ref 43.0–77.0)
Platelets: 200 10*3/uL (ref 150.0–400.0)
RBC: 4.5 Mil/uL (ref 4.22–5.81)
RDW: 14.2 % (ref 11.5–14.6)
WBC: 6.9 10*3/uL (ref 4.5–10.5)

## 2013-09-14 LAB — HEMOGLOBIN A1C: Hgb A1c MFr Bld: 6.3 % (ref 4.6–6.5)

## 2013-09-14 MED ORDER — THERA M PLUS PO TABS: 1.0000 | ORAL_TABLET | Freq: Every day | ORAL | Status: DC

## 2013-09-14 MED ORDER — FUROSEMIDE 40 MG PO TABS: 60.0000 mg | ORAL_TABLET | Freq: Two times a day (BID) | ORAL | Status: DC

## 2013-09-14 MED ORDER — SITAGLIPTIN PHOSPHATE 100 MG PO TABS: 100.0000 mg | ORAL_TABLET | Freq: Every day | ORAL | Status: DC

## 2013-09-14 MED ORDER — PANTOPRAZOLE SODIUM 40 MG PO TBEC: DELAYED_RELEASE_TABLET | ORAL | Status: DC

## 2013-09-14 MED ORDER — NIACIN-LOVASTATIN ER 1000-20 MG PO TB24: 1.0000 | ORAL_TABLET | Freq: Every day | ORAL | Status: DC

## 2013-09-14 MED ORDER — LOSARTAN POTASSIUM 100 MG PO TABS: 100.0000 mg | ORAL_TABLET | Freq: Every day | ORAL | Status: DC

## 2013-09-14 MED ORDER — ESCITALOPRAM OXALATE 10 MG PO TABS: 10.0000 mg | ORAL_TABLET | Freq: Every day | ORAL | Status: DC

## 2013-09-14 MED ORDER — CARVEDILOL 25 MG PO TABS: ORAL_TABLET | ORAL | Status: DC

## 2013-09-14 MED ORDER — ZOLPIDEM TARTRATE 10 MG PO TABS: 10.0000 mg | ORAL_TABLET | Freq: Every evening | ORAL | Status: DC | PRN

## 2013-09-14 NOTE — Progress Notes (Signed)
Pre visit review using our clinic review tool, if applicable. No additional management support is needed unless otherwise documented below in the visit note. 

## 2013-09-14 NOTE — Patient Instructions (Addendum)
Get your blood work before you leave  Next visit for a physical exam  no fasting, in 4-5 months  Please make an appointment

## 2013-09-14 NOTE — Assessment & Plan Note (Signed)
Good compliance with medicines,Labs.

## 2013-09-14 NOTE — Assessment & Plan Note (Addendum)
Off Coumadin since had a intracranial bleed 06-2012 Back on Aspirin. Also, I spoke with neurology today, he could be on Plavix if needed

## 2013-09-14 NOTE — Assessment & Plan Note (Addendum)
Glyburide was stopped based on last A1c which was below 6. Ambulatory CBGs usually less than 120. Plan: Labs

## 2013-09-14 NOTE — Assessment & Plan Note (Signed)
Dx by a 2012 stress test, see below.  Very rarely has symptoms, see history of present illness  1. Small area of reversibility in the distal anterolateral wall  the left ventricle, suspicious for myocardial ischemia.  2. Septal wall hypokinesis, with calculated left ventricular  ejection fraction of 48%.

## 2013-09-14 NOTE — Progress Notes (Signed)
Subjective:    Patient ID: Bradley [name], male    DOB: 04-20-1941, 72 y.o.   MRN: [id number]  HPI ROV Diabetes--ambulatory BPs usually less than 120, occasionally up to 160 depending on diet. History of intracranial bleed, neurology called, it is safe to take aspirin and/or  Plavix. No Coumadin. Hypertension, good medication compliance History of CAD, a week ago had a mild chest pain that quickly went away with one nitroglycerin. Needs multiple medications refill  Past Medical History  Diagnosis Date  . HTN (hypertension)   . HLD (hyperlipidemia)   . BPH (benign prostatic hyperplasia)     Saw Dr [name] 2004, normal renal u/s  . CHF (congestive heart failure)     Thought primarily to be non-systolic although EF down (EF 40-98% 12/2010, down to 35-40% 09/05/11), cath 2008 with no CAD, nuclear study 07/2011 showing Small area of reversibility in the distal ant/lat wall the left ventricle suspicious for ischemia/septal wall HK but felt to be low risk  (per D/C Summary 07/2011)  . Pleural effusion 2008    S/p decortication  . Insomnia   . Atrial fibrillation     s/p AV node ablation & BiV PPM implantation 09/08/11 (op dictation pending)  . Pulmonary HTN     per cath 2008  . Peripheral vascular disease     ??  . Anemia     intermittent  . Pacemaker   . Heart murmur   . Type II diabetes mellitus 1999  . Migraines   . Arthritis     "?back" (09/27/2012)  . On home oxygen therapy     "sleep w/2L q hs" (09/27/2012)  . 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 CAD (coronary artery disease)   . Abnormal CT scan, chest 2012    CT chest, several lymphadenopathies. Sees pulmonary   Past Surgical History  Procedure Laterality Date  . Pleural scarification    . Lung decortication    . Colonoscopy  03/10/11    normal  . Pneumothorax with fibrothorax  ~ 2010  . Insert / replace / remove pacemaker  09/08/11    pacemaker placement  . Tonsillectomy      "as a  kid" (09/27/2012)  . Cardiac catheterization      "couple times; never had balloon or stent" (09/27/2012)   History   Social History  . Marital Status: Married    Spouse Name: [place]    Number of Children: 1  . 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 Smoking status: Former Smoker -- 2.50 packs/day for 25 years    Types: Cigarettes    Quit date: 10/11/1980  . Smokeless tobacco: Never Used     Comment: used to smoke 2.5 ppd for 25 years  . Alcohol Use: Yes     Comment: 09/27/2012 "occasionally once a year; if that"  . Drug Use: No  . Sexual Activity: No   Other Topics Concern  . Not on file   Social History Narrative   Moved from [place] 2003.[place]   He lives w/ his wife              Review of Systems Denies lower extremity edema or claudication, he is very active. Denies nausea, vomiting, diarrhea.     Objective:   Physical Exam BP 116/74  Pulse 89  Temp(Src) 97.4 F (36.3 C)  Wt 215  lb (97.523 kg)  SpO2 93% General -- alert, well-developed, NAD.  Neck --normal carotid pulse  Lungs -- normal respiratory effort, no intercostal retractions, no accessory muscle use, and normal breath sounds.  Heart-- normal rate, regular rhythm, no murmur.  Abdomen-- Not distended, good bowel sounds,soft, non-tender. No bruit Extremities-- no pretibial edema bilaterally  Neurologic--  alert & oriented X3.  Psych-- Cognition and judgment appear intact. Cooperative with normal attention span and concentration. No anxious appearing , no depressed appearing.         Assessment & Plan:

## 2013-09-14 NOTE — Assessment & Plan Note (Signed)
Currently asymptomatic 

## 2013-09-14 NOTE — Assessment & Plan Note (Signed)
Well-controlled, refill meds, labs

## 2013-09-15 ENCOUNTER — Encounter: Payer: Self-pay | Admitting: Internal Medicine

## 2013-09-28 ENCOUNTER — Other Ambulatory Visit: Payer: Self-pay | Admitting: Internal Medicine

## 2013-10-20 ENCOUNTER — Other Ambulatory Visit: Payer: Self-pay | Admitting: Internal Medicine

## 2013-10-22 NOTE — Telephone Encounter (Signed)
Carvedilol refilled per protocol. JG//CMA 

## 2013-11-29 ENCOUNTER — Other Ambulatory Visit: Payer: Self-pay | Admitting: Internal Medicine

## 2013-12-14 ENCOUNTER — Telehealth: Payer: Self-pay | Admitting: *Deleted

## 2013-12-14 NOTE — Telephone Encounter (Signed)
Received application for permanent disability parking placard. Filled out as much information as possible, billing sheet attached and placed in blue folder for [name]. JG//CMA

## 2013-12-17 NOTE — Telephone Encounter (Signed)
Received signed application for handicap placard on Friday, December 14, 2013. Called and informed patient. JG//CMA

## 2014-01-06 ENCOUNTER — Other Ambulatory Visit: Payer: Self-pay | Admitting: Internal Medicine

## 2014-01-10 ENCOUNTER — Telehealth: Payer: Self-pay

## 2014-01-10 NOTE — Telephone Encounter (Signed)
Medication List and allergies:  Reviewed and updated  90 day supply/mail order: [facility] Local prescriptions: [facility] [address]  Immunizations due: UTD  A/P:   No changes to FH, PSH or Personal Hx Flu vaccine--09/2013 Tdap--09/2013 PNA--2010  2005 Shingles--2012 [facility]--02/2011--Dr Gessner--neg PSA--none on file  To Discuss with Provider: Not at this time

## 2014-01-11 ENCOUNTER — Other Ambulatory Visit: Payer: Self-pay | Admitting: Internal Medicine

## 2014-01-14 ENCOUNTER — Encounter: Payer: Self-pay | Admitting: Lab

## 2014-01-14 ENCOUNTER — Encounter: Payer: Self-pay | Admitting: Internal Medicine

## 2014-01-14 ENCOUNTER — Ambulatory Visit (INDEPENDENT_AMBULATORY_CARE_PROVIDER_SITE_OTHER): Payer: Medicare Other | Admitting: Internal Medicine

## 2014-01-14 VITALS — BP 114/73 | HR 86 | Temp 97.9°F | Ht 69.4 in | Wt 220.0 lb

## 2014-01-14 DIAGNOSIS — R599 Enlarged lymph nodes, unspecified: Secondary | ICD-10-CM

## 2014-01-14 DIAGNOSIS — I10 Essential (primary) hypertension: Secondary | ICD-10-CM

## 2014-01-14 DIAGNOSIS — E119 Type 2 diabetes mellitus without complications: Secondary | ICD-10-CM

## 2014-01-14 DIAGNOSIS — N400 Enlarged prostate without lower urinary tract symptoms: Secondary | ICD-10-CM

## 2014-01-14 DIAGNOSIS — Z0000 Encounter for general adult medical examination without abnormal findings: Secondary | ICD-10-CM

## 2014-01-14 DIAGNOSIS — R591 Generalized enlarged lymph nodes: Secondary | ICD-10-CM

## 2014-01-14 DIAGNOSIS — R972 Elevated prostate specific antigen [PSA]: Secondary | ICD-10-CM

## 2014-01-14 LAB — PSA: PSA: 3.5 ng/mL (ref 0.10–4.00)

## 2014-01-14 LAB — TSH: TSH: 1.11 u[IU]/mL (ref 0.35–5.50)

## 2014-01-14 LAB — HEMOGLOBIN A1C: Hgb A1c MFr Bld: 6.9 % — ABNORMAL HIGH (ref 4.6–6.5)

## 2014-01-14 NOTE — Assessment & Plan Note (Signed)
Seems to be doing well, last eye exam May 2014 Feet care  discussed

## 2014-01-14 NOTE — Assessment & Plan Note (Addendum)
DRE normal today, check a PSA (h/o elevated PSA)

## 2014-01-14 NOTE — Assessment & Plan Note (Addendum)
Abnormal CT CT chest 07-2011, f/u CT 2013---> improved ------------------------------------- CT report 2012: 1. No CT findings for pulmonary embolism.  2. Normal thoracic aorta.  3. Small amount of pericardial fluid and small bilateral pleural  effusions.  4. Unusual fluid collection at the right apex is likely loculated  pleural fluid.  5. Enlarging supraclavicular, subpectoral, axillary, mediastinal  and hilar adenopathy. PET CT may be helpful for further  evaluation.  6. Mosaic pattern along this is likely due to pulmonary edema.  7. Stable right lower lobe pulmonary nodule.  Original Report Authenticated By: P. Loralie Champagne, M.D.

## 2014-01-14 NOTE — Assessment & Plan Note (Signed)
Well-controlled, last BMP okay, no change

## 2014-01-14 NOTE — Progress Notes (Signed)
Subjective:    Patient ID: Bradley [name], male    DOB: 1941/02/27, 73 y.o.   MRN: [id number]  DOS:  01/14/2014 Type of  visit:   Here for Medicare AWV:  1. Risk factors based on Past M, S, F history: reviewed  2. Physical Activities: active, yard and home chores, takes walks sometimes  3. Depression/mood: neg screen 4. Hearing: has hearing aids , uses them irregulalrly  5. ADL's: independent  , still drives 6. Fall Risk: low, prevention discussed  7. Home Safety: does feel safe at home  8. Height, weight, &visual acuity: see VS, uses glasses no problems, saw eye doctor 02-2013, good reports 9. Counseling: yes  10. Labs ordered based on risk factors: yes  11. Referral Coordination, if needed  12. Care Plan, see a/p  13. Cognitive Assessment: motor, cognition, memory wnl   in addition, we discussed the following Diabetes, good medication compliance, CBG range from 100-160 depending on his diet. on Lexapro, symptoms well controlled without apparent side effects. CAD, no recent chest pain or need to use nitroglycerin   ROS No  CP, occ SOB No lower extremity edema Denies  nausea, vomiting diarrhea Denies  blood in the stools  (-) cough, sputum production (-) wheezing, chest congestion (-)hemoptysis No dysuria, gross hematuria, difficulty urinating  No HAs. Denies dizziness    Past Medical History  Diagnosis Date  . HTN (hypertension)   . HLD (hyperlipidemia)   . BPH (benign prostatic hyperplasia)     Saw Dr [name] 2004, normal renal u/s  . CHF (congestive heart failure)     Thought primarily to be non-systolic although EF down (EF 54-65% 12/2010, down to 35-40% 09/05/11), cath 2008 with no CAD, nuclear study 07/2011 showing Small area of reversibility in the distal ant/lat wall the left ventricle suspicious for ischemia/septal wall HK but felt to be low risk  (per D/C Summary 07/2011)  . Pleural effusion 2008    S/p decortication  . Insomnia   . Atrial fibrillation    s/p AV node ablation & BiV PPM implantation 09/08/11 (op dictation pending)  . Pulmonary HTN     per cath 2008  . Peripheral vascular disease     ??  . Anemia     intermittent  . Pacemaker   . Heart murmur   . Type II diabetes mellitus 1999  . Migraines   . Arthritis     "?back" (09/27/2012)  . On home oxygen therapy     "sleep w/2L q hs" (09/27/2012)  . 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 CAD (coronary artery disease)   . Abnormal CT scan, chest 2012    CT chest, several lymphadenopathies. Sees pulmonary    Past Surgical History  Procedure Laterality Date  . Pleural scarification    . Lung decortication    . Colonoscopy  03/10/11    normal  . Pneumothorax with fibrothorax  ~ 2010  . Insert / replace / remove pacemaker  09/08/11    pacemaker placement  . Tonsillectomy      "as a kid" (09/27/2012)  . Cardiac catheterization      "couple times; never had balloon or stent" (09/27/2012)    History   Social History  . Marital Status: Married    Spouse Name: [place]    Number of Children: 1  . Years of Education: N/A   Occupational History  . retired Naval architect    Social History Main  Topics  . Smoking status: Former Smoker -- 2.50 packs/day for 25 years    Types: Cigarettes    Quit date: 10/11/1980  . Smokeless tobacco: Never Used     Comment: used to smoke 2.5 ppd for 25 years  . Alcohol Use: Yes     Comment: 09/27/2012 "occasionally once a year; if that"  . Drug Use: No  . Sexual Activity: No   Other Topics Concern  . Not on file   Social History Narrative   Moved from [place] 2003.Marland Kitchen   He lives w/ his wife             Family History  Problem Relation Age of Onset  . Diabetes Father   . Breast cancer Maternal Aunt   . Prostate cancer Neg Hx   . Colon cancer Neg Hx   . Coronary artery disease Father   . Polycythemia Mother          Medication List       This list is accurate as of: 01/14/14  9:24 PM.  Always  use your most recent med list.               amLODipine 10 MG tablet  Commonly known as:  NORVASC  TAKE 1 TABLET DAILY     aspirin 81 MG tablet  Take 81 mg by mouth daily.     carvedilol 25 MG tablet  Commonly known as:  COREG  TAKE ONE AND ONE-HALF TABLETS TWICE A DAY WITH MEALS     escitalopram 10 MG tablet  Commonly known as:  LEXAPRO  Take 1 tablet (10 mg total) by mouth at bedtime.     furosemide 40 MG tablet  Commonly known as:  LASIX  Take 1.5 tablets (60 mg total) by mouth 2 (two) times daily.     losartan 100 MG tablet  Commonly known as:  COZAAR  Take 1 tablet (100 mg total) by mouth daily.     multivitamins ther. w/minerals Tabs tablet  Take 1 tablet by mouth daily.     niacin-lovastatin 1000-20 MG 24 hr tablet  Commonly known as:  ADVICOR  Take 1 tablet by mouth at bedtime.     nitroGLYCERIN 0.4 MG SL tablet  Commonly known as:  NITROSTAT  Place 1 tablet (0.4 mg total) under the tongue every 5 (five) minutes as needed for chest pain.     ONE TOUCH ULTRA TEST test strip  Generic drug:  glucose blood  USE AS DIRECTED     pantoprazole 40 MG tablet  Commonly known as:  PROTONIX  TAKE 1 TABLET EVERY MORNING     sitaGLIPtin 100 MG tablet  Commonly known as:  JANUVIA  Take 1 tablet (100 mg total) by mouth daily.     zolpidem 10 MG tablet  Commonly known as:  AMBIEN  Take 1 tablet (10 mg total) by mouth at bedtime as needed. For sleep           Objective:   Physical Exam BP 114/73  Pulse 86  Temp(Src) 97.9 F (36.6 C)  Ht 5' 9.4" (1.763 m)  Wt 220 lb (99.791 kg)  BMI 32.11 kg/m2  SpO2 94% General -- alert, well-developed, NAD.  Neck --no thyromegaly  HEENT-- Not pale.  Lungs -- normal respiratory effort, no intercostal retractions, no accessory muscle use, and normal breath sounds.  Heart-- normal rate, regular rhythm, no murmur.  Abdomen-- Not distended, good bowel sounds,soft, non-tender.  Rectal-- No external abnormalities noted.  Normal sphincter tone. No rectal masses or tenderness. Brown stool  Prostate--Prostate gland firm and smooth, no enlargement, nodularity, tenderness, mass, asymmetry or induration. Extremities-- no pretibial edema bilaterally  Neurologic--  alert & oriented X3. Speech normal, gait normal, strength normal in all extremities.  Psych-- Cognition and judgment appear intact. Cooperative with normal attention span and concentration. No anxious or depressed appearing.      Assessment & Plan:

## 2014-01-14 NOTE — Assessment & Plan Note (Addendum)
Td 2004 pneumonia shot 2005 , 2010; prevnar recommended (not available here today)  shingle immunization ---->2012  Cscope: 02-2011 normal  DRE today Check a PSA  Diet discussed

## 2014-01-14 NOTE — Patient Instructions (Signed)
Get your blood work before you leave   Consider getting a PREVNAR (new pneumonia shot )  Next visit is for routine check up regards your blood sugar , blood pressure  in 4 months  No need to come back fasting Please make an appointment    Diabetes and Foot Care Diabetes may cause you to have problems because of poor blood supply (circulation) to your feet and legs. This may cause the skin on your feet to become thinner, break easier, and heal more slowly. Your skin may become dry, and the skin may peel and crack. You may also have nerve damage in your legs and feet causing decreased feeling in them. You may not notice minor injuries to your feet that could lead to infections or more serious problems. Taking care of your feet is one of the most important things you can do for yourself.  HOME CARE INSTRUCTIONS  Wear shoes at all times, even in the house. Do not go barefoot. Bare feet are easily injured.  Check your feet daily for blisters, cuts, and redness. If you cannot see the bottom of your feet, use a mirror or ask someone for help.  Wash your feet with warm water (do not use hot water) and mild soap. Then pat your feet and the areas between your toes until they are completely dry. Do not soak your feet as this can dry your skin.  Apply a moisturizing lotion or petroleum jelly (that does not contain alcohol and is unscented) to the skin on your feet and to dry, brittle toenails. Do not apply lotion between your toes.  Trim your toenails straight across. Do not dig under them or around the cuticle. File the edges of your nails with an emery board or nail file.  Do not cut corns or calluses or try to remove them with medicine.  Wear clean socks or stockings every day. Make sure they are not too tight. Do not wear knee-high stockings since they may decrease blood flow to your legs.  Wear shoes that fit properly and have enough cushioning. To break in new shoes, wear them for just a few  hours a day. This prevents you from injuring your feet. Always look in your shoes before you put them on to be sure there are no objects inside.  Do not cross your legs. This may decrease the blood flow to your feet.  If you find a minor scrape, cut, or break in the skin on your feet, keep it and the skin around it clean and dry. These areas may be cleansed with mild soap and water. Do not cleanse the area with peroxide, alcohol, or iodine.  When you remove an adhesive bandage, be sure not to damage the skin around it.  If you have a wound, look at it several times a day to make sure it is healing.  Do not use heating pads or hot water bottles. They may burn your skin. If you have lost feeling in your feet or legs, you may not know it is happening until it is too late.  Make sure your health care provider performs a complete foot exam at least annually or more often if you have foot problems. Report any cuts, sores, or bruises to your health care provider immediately. SEEK MEDICAL CARE IF:   You have an injury that is not healing.  You have cuts or breaks in the skin.  You have an ingrown nail.  You notice redness on  your legs or feet.  You feel burning or tingling in your legs or feet.  You have pain or cramps in your legs and feet.  Your legs or feet are numb.  Your feet always feel cold. SEEK IMMEDIATE MEDICAL CARE IF:   There is increasing redness, swelling, or pain in or around a wound.  There is a red line that goes up your leg.  Pus is coming from a wound.  You develop a fever or as directed by your health care provider.  You notice a bad smell coming from an ulcer or wound. Document Released: 09/24/2000 Document Revised: 05/30/2013 Document Reviewed: 03/06/2013 [facility] Patient Information 2014 [place], [place].    Fall Prevention and Home Safety Falls cause injuries and can affect all age groups. It is possible to use preventive measures to significantly  decrease the likelihood of falls. There are many simple measures which can make your home safer and prevent falls. OUTDOORS  Repair cracks and edges of walkways and driveways.  Remove high doorway thresholds.  Trim shrubbery on the main path into your home.  Have good outside lighting.  Clear walkways of tools, rocks, debris, and clutter.  Check that handrails are not broken and are securely fastened. Both sides of steps should have handrails.  Have leaves, snow, and ice cleared regularly.  Use sand or salt on walkways during winter months.  In the garage, clean up grease or oil spills. BATHROOM  Install night lights.  Install grab bars by the toilet and in the tub and shower.  Use non-skid mats or decals in the tub or shower.  Place a plastic non-slip stool in the shower to sit on, if needed.  Keep floors dry and clean up all water on the floor immediately.  Remove soap buildup in the tub or shower on a regular basis.  Secure bath mats with non-slip, double-sided rug tape.  Remove throw rugs and tripping hazards from the floors. BEDROOMS  Install night lights.  Make sure a bedside light is easy to reach.  Do not use oversized bedding.  Keep a telephone by your bedside.  Have a firm chair with side arms to use for getting dressed.  Remove throw rugs and tripping hazards from the floor. KITCHEN  Keep handles on pots and pans turned toward the center of the stove. Use back burners when possible.  Clean up spills quickly and allow time for drying.  Avoid walking on wet floors.  Avoid hot utensils and knives.  Position shelves so they are not too high or low.  Place commonly used objects within easy reach.  If necessary, use a sturdy step stool with a grab bar when reaching.  Keep electrical cables out of the way.  Do not use floor polish or wax that makes floors slippery. If you must use wax, use non-skid floor wax.  Remove throw rugs and tripping  hazards from the floor. STAIRWAYS  Never leave objects on stairs.  Place handrails on both sides of stairways and use them. Fix any loose handrails. Make sure handrails on both sides of the stairways are as long as the stairs.  Check carpeting to make sure it is firmly attached along stairs. Make repairs to worn or loose carpet promptly.  Avoid placing throw rugs at the top or bottom of stairways, or properly secure the rug with carpet tape to prevent slippage. Get rid of throw rugs, if possible.  Have an electrician put in a light switch at the top  and bottom of the stairs. OTHER FALL PREVENTION TIPS  Wear low-heel or rubber-soled shoes that are supportive and fit well. Wear closed toe shoes.  When using a stepladder, make sure it is fully opened and both spreaders are firmly locked. Do not climb a closed stepladder.  Add color or contrast paint or tape to grab bars and handrails in your home. Place contrasting color strips on first and last steps.  Learn and use mobility aids as needed. Install an electrical emergency response system.  Turn on lights to avoid dark areas. Replace light bulbs that burn out immediately. Get light switches that glow.  Arrange furniture to create clear pathways. Keep furniture in the same place.  Firmly attach carpet with non-skid or double-sided tape.  Eliminate uneven floor surfaces.  Select a carpet pattern that does not visually hide the edge of steps.  Be aware of all pets. OTHER HOME SAFETY TIPS  Set the water temperature for 120 F (48.8 C).  Keep emergency numbers on or near the telephone.  Keep smoke detectors on every level of the home and near sleeping areas. Document Released: 09/17/2002 Document Revised: 03/28/2012 Document Reviewed: 12/17/2011 [facility] Patient Information 2014 [place], [place].

## 2014-01-15 ENCOUNTER — Telehealth: Payer: Self-pay | Admitting: Internal Medicine

## 2014-01-15 NOTE — Telephone Encounter (Signed)
Relevant patient education assigned to patient using Emmi. ° °

## 2014-01-17 ENCOUNTER — Other Ambulatory Visit: Payer: Self-pay

## 2014-01-28 ENCOUNTER — Telehealth: Payer: Self-pay

## 2014-01-28 ENCOUNTER — Ambulatory Visit (INDEPENDENT_AMBULATORY_CARE_PROVIDER_SITE_OTHER): Payer: Medicare Other | Admitting: *Deleted

## 2014-01-28 ENCOUNTER — Encounter: Payer: Self-pay | Admitting: Internal Medicine

## 2014-01-28 DIAGNOSIS — I495 Sick sinus syndrome: Secondary | ICD-10-CM

## 2014-01-28 DIAGNOSIS — I509 Heart failure, unspecified: Secondary | ICD-10-CM

## 2014-01-28 DIAGNOSIS — I4891 Unspecified atrial fibrillation: Secondary | ICD-10-CM

## 2014-01-28 LAB — MDC_IDC_ENUM_SESS_TYPE_INCLINIC
Battery Remaining Longevity: 68 mo
Battery Voltage: 3.01 V
Brady Statistic AP VP Percent: 0 %
Brady Statistic AP VS Percent: 0 %
Brady Statistic AS VP Percent: 98.24 %
Brady Statistic AS VS Percent: 1.76 %
Brady Statistic RA Percent Paced: 0 %
Brady Statistic RV Percent Paced: 98.24 %
Date Time Interrogation Session: 20150420110645
Lead Channel Impedance Value: 285 Ohm
Lead Channel Impedance Value: 4047 Ohm
Lead Channel Impedance Value: 4047 Ohm
Lead Channel Impedance Value: 456 Ohm
Lead Channel Impedance Value: 475 Ohm
Lead Channel Impedance Value: 475 Ohm
Lead Channel Impedance Value: 589 Ohm
Lead Channel Impedance Value: 589 Ohm
Lead Channel Impedance Value: 608 Ohm
Lead Channel Pacing Threshold Amplitude: 0.625 V
Lead Channel Pacing Threshold Amplitude: 0.875 V
Lead Channel Pacing Threshold Pulse Width: 0.4 ms
Lead Channel Pacing Threshold Pulse Width: 0.4 ms
Lead Channel Sensing Intrinsic Amplitude: 5.125 mV
Lead Channel Sensing Intrinsic Amplitude: 5.125 mV
Lead Channel Setting Pacing Amplitude: 2.5 V
Lead Channel Setting Pacing Amplitude: 2.5 V
Lead Channel Setting Pacing Pulse Width: 0.4 ms
Lead Channel Setting Pacing Pulse Width: 0.4 ms
Lead Channel Setting Sensing Sensitivity: 4 mV
Zone Setting Detection Interval: 350 ms
Zone Setting Detection Interval: 400 ms

## 2014-01-28 NOTE — Telephone Encounter (Signed)
Relevant patient education assigned to patient using Emmi. ° °

## 2014-01-28 NOTE — Progress Notes (Signed)
CRT-P device check in clinic. Normal device function. Thresholds and impedance consistent with previous measurements. Histograms appropriate for patient and level of activity. Permanent AF + ASA 81. 23 ventricular high rate episodes---max dur. 6 sec.---?NSVT. Patient bi-ventricularly pacing >100% of the time with 1% as VSRp. Device programmed with appropriate safety margins. Device heart failure diagnostics are within normal limits, but appear unstable over time (most recent 11/10-12/26)---pt w/ c/o occas SOB/DOE. Estimated longevity 5.5 years. Patient will follow up with GT in 6 months.

## 2014-01-31 ENCOUNTER — Telehealth: Payer: Self-pay | Admitting: *Deleted

## 2014-01-31 NOTE — Telephone Encounter (Signed)
UDS collected and results reviewed by Dr.Paz. Patient is moderate risk due to negative screen (for ambien) but gets meds filled regularly. Patient to repeat UDS by April 15, 2014.

## 2014-02-01 ENCOUNTER — Telehealth: Payer: Self-pay

## 2014-02-01 NOTE — Telephone Encounter (Signed)
UDS: 01/14/2014 Negative for Ambien (gets regularly) Per Dr Drue Novel Moderate Risk

## 2014-02-22 ENCOUNTER — Other Ambulatory Visit: Payer: Self-pay | Admitting: Internal Medicine

## 2014-02-28 ENCOUNTER — Encounter: Payer: Self-pay | Admitting: Internal Medicine

## 2014-03-04 ENCOUNTER — Other Ambulatory Visit: Payer: Self-pay | Admitting: Internal Medicine

## 2014-03-15 ENCOUNTER — Other Ambulatory Visit: Payer: Self-pay | Admitting: Internal Medicine

## 2014-03-19 ENCOUNTER — Telehealth: Payer: Self-pay | Admitting: *Deleted

## 2014-03-19 ENCOUNTER — Other Ambulatory Visit: Payer: Self-pay | Admitting: *Deleted

## 2014-03-19 MED ORDER — CARVEDILOL 25 MG PO TABS: ORAL_TABLET | ORAL | Status: DC

## 2014-03-19 NOTE — Telephone Encounter (Signed)
Caller name:  Trajan Relation to pt:  self Call back number:  [phone] Pharmacy:  [facility]  Reason for call:   Pt called and stated [facility] has been trying to get in touch with our office and we have not returned their call.  Pt needs refill, not sure what medication it is for.  [facility] gave him ref# [id number].  Pt asked for [place] to call [facility].  bw

## 2014-03-19 NOTE — Telephone Encounter (Signed)
Rx sent to the pharmacy by e-script.//AB/CMA 

## 2014-03-20 ENCOUNTER — Other Ambulatory Visit: Payer: Self-pay | Admitting: Internal Medicine

## 2014-03-20 MED ORDER — PANTOPRAZOLE SODIUM 40 MG PO TBEC: DELAYED_RELEASE_TABLET | ORAL | Status: DC

## 2014-03-20 NOTE — Telephone Encounter (Signed)
rx sent

## 2014-04-01 ENCOUNTER — Other Ambulatory Visit: Payer: Self-pay | Admitting: Internal Medicine

## 2014-04-08 ENCOUNTER — Telehealth: Payer: Self-pay | Admitting: Internal Medicine

## 2014-04-08 MED ORDER — PANTOPRAZOLE SODIUM 40 MG PO TBEC: DELAYED_RELEASE_TABLET | ORAL | Status: DC

## 2014-04-08 NOTE — Telephone Encounter (Signed)
rx resent  

## 2014-04-08 NOTE — Telephone Encounter (Signed)
Caller name: Bradley [name] Relation to pt: patient Call back number: [phone] Pharmacy: [facility]  Reason for call: Patient called to request a refill for Pantoprazole. Also he states that [facility] a request as well but no response from [place]. Patient would like a call from [place]. Please advise.

## 2014-05-16 ENCOUNTER — Encounter: Payer: Self-pay | Admitting: Internal Medicine

## 2014-05-16 ENCOUNTER — Ambulatory Visit (INDEPENDENT_AMBULATORY_CARE_PROVIDER_SITE_OTHER): Payer: Medicare Other | Admitting: Internal Medicine

## 2014-05-16 VITALS — BP 128/68 | HR 80 | Temp 97.7°F | Wt 220.4 lb

## 2014-05-16 DIAGNOSIS — G4700 Insomnia, unspecified: Secondary | ICD-10-CM | POA: Diagnosis not present

## 2014-05-16 DIAGNOSIS — I509 Heart failure, unspecified: Secondary | ICD-10-CM

## 2014-05-16 DIAGNOSIS — I10 Essential (primary) hypertension: Secondary | ICD-10-CM

## 2014-05-16 DIAGNOSIS — I5042 Chronic combined systolic (congestive) and diastolic (congestive) heart failure: Secondary | ICD-10-CM

## 2014-05-16 DIAGNOSIS — E119 Type 2 diabetes mellitus without complications: Secondary | ICD-10-CM | POA: Diagnosis not present

## 2014-05-16 LAB — BASIC METABOLIC PANEL
BUN: 12 mg/dL (ref 6–23)
CO2: 30 mEq/L (ref 19–32)
Calcium: 8.8 mg/dL (ref 8.4–10.5)
Chloride: 99 mEq/L (ref 96–112)
Creatinine, Ser: 0.9 mg/dL (ref 0.4–1.5)
GFR: 93.84 mL/min (ref >60.00)
Glucose, Bld: 165 mg/dL — ABNORMAL HIGH (ref 70–99)
Potassium: 4.3 mEq/L (ref 3.5–5.1)
Sodium: 136 mEq/L (ref 135–145)

## 2014-05-16 LAB — HEMOGLOBIN A1C: Hgb A1c MFr Bld: 7.3 % — ABNORMAL HIGH (ref 4.6–6.5)

## 2014-05-16 NOTE — Progress Notes (Signed)
Subjective:    Patient ID: Bradley [name], male    DOB: 14-Mar-1941, 73 y.o.   MRN: [id number]  DOS:  05/16/2014 Type of visit - description: routine History: In general feeling well   Medication list reviewed, good compliance, needs a RF  nitroglycerin Labs reviewed, needs a BMP and A1c. Weights rviewed,  stable.  trying to remain active by walking the dog daily.   ROS Denies chest pain or lower extremity edema. Unable to walk as much during the hot summer days unless he has oxygen with him. Did have some anxiety 2 days ago d/t  Wife's syncope , otherwise emotionally doing well  Past Medical History  Diagnosis Date  . HTN (hypertension)   . HLD (hyperlipidemia)   . BPH (benign prostatic hyperplasia)     Saw Dr [name] 2004, normal renal u/s  . CHF (congestive heart failure)     Thought primarily to be non-systolic although EF down (EF 46-28% 12/2010, down to 35-40% 09/05/11), cath 2008 with no CAD, nuclear study 07/2011 showing Small area of reversibility in the distal ant/lat wall the left ventricle suspicious for ischemia/septal wall HK but felt to be low risk  (per D/C Summary 07/2011)  . Pleural effusion 2008    S/p decortication  . Insomnia   . Atrial fibrillation     s/p AV node ablation & BiV PPM implantation 09/08/11 (op dictation pending)  . Pulmonary HTN     per cath 2008  . Peripheral vascular disease     ??  . Anemia     intermittent  . Pacemaker   . Heart murmur   . Type II diabetes mellitus 1999  . Migraines   . Arthritis     "?back" (09/27/2012)  . On home oxygen therapy     "sleep w/2L q hs" (09/27/2012)  . 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 CAD (coronary artery disease)   . Abnormal CT scan, chest 2012    CT chest, several lymphadenopathies. Sees pulmonary    Past Surgical History  Procedure Laterality Date  . Pleural scarification    . Lung decortication    . Colonoscopy  03/10/11    normal  . Pneumothorax with  fibrothorax  ~ 2010  . Insert / replace / remove pacemaker  09/08/11    pacemaker placement  . Tonsillectomy      "as a kid" (09/27/2012)  . Cardiac catheterization      "couple times; never had balloon or stent" (09/27/2012)    History   Social History  . Marital Status: Married    Spouse Name: [place]    Number of Children: 1  . 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 Smoking status: Former Smoker -- 2.50 packs/day for 25 years    Types: Cigarettes    Quit date: 10/11/1980  . Smokeless tobacco: Never Used     Comment: used to smoke 2.5 ppd for 25 years  . Alcohol Use: Yes     Comment: 09/27/2012 "occasionally once a year; if that"  . Drug Use: No  . Sexual Activity: No   Other Topics Concern  . Not on file   Social History Narrative   Moved from [place] 2003.[place]   He lives w/ his wife                  Medication List  This list is accurate as of: 05/16/14  5:34 PM.  Always use your most recent med list.               ADVICOR 1000-20 MG 24 hr tablet  Generic drug:  niacin-lovastatin  TAKE 1 TABLET AT BEDTIME     amLODipine 10 MG tablet  Commonly known as:  NORVASC  TAKE 1 TABLET DAILY     aspirin 81 MG tablet  Take 81 mg by mouth daily.     carvedilol 25 MG tablet  Commonly known as:  COREG  TAKE 1 AND 1/2 TABLETS TWICE A DAY WITH MEALS.     furosemide 40 MG tablet  Commonly known as:  LASIX  TAKE ONE AND ONE-HALF TABLETS (60 MG) TWICE A DAY     JANUVIA 100 MG tablet  Generic drug:  sitaGLIPtin  TAKE 1 TABLET DAILY     losartan 100 MG tablet  Commonly known as:  COZAAR  TAKE 1 TABLET DAILY     multivitamins ther. w/minerals Tabs tablet  Take 1 tablet by mouth daily.     nitroGLYCERIN 0.4 MG SL tablet  Commonly known as:  NITROSTAT  Place 1 tablet (0.4 mg total) under the tongue every 5 (five) minutes as needed for chest pain.     ONE TOUCH ULTRA TEST test strip    Generic drug:  glucose blood  USE AS DIRECTED     pantoprazole 40 MG tablet  Commonly known as:  PROTONIX  TAKE 1 TABLET EVERY MORNING     zolpidem 10 MG tablet  Commonly known as:  AMBIEN  Take 1 tablet (10 mg total) by mouth at bedtime as needed. For sleep           Objective:   Physical Exam BP 128/68  Pulse 80  Temp(Src) 97.7 F (36.5 C) (Oral)  Wt 220 lb 6 oz (99.961 kg)  SpO2 90% General -- alert, well-developed, NAD.   Lungs -- normal respiratory effort, no intercostal retractions, no accessory muscle use, and slt decreased breath sounds.  Heart-- seems regular today, no murmur.   Extremities-- no pretibial edema bilaterally  Neurologic--  alert & oriented X3.   Psych-- Cognition and judgment appear intact. Cooperative with normal attention span and concentration. No anxious or depressed appearing.        Assessment & Plan:

## 2014-05-16 NOTE — Assessment & Plan Note (Addendum)
Last UDS  "moderate" because he had no ambien in the system but he uses it when necessary, does not take control substances. Plan-- Refill Ambien as needed No need for further UDS

## 2014-05-16 NOTE — Patient Instructions (Signed)
Get your blood work before you leave    Next visit is for routine check up regards your blood sugar , blood pressure   in 6 months  No need to come back fasting Please make an appointment    

## 2014-05-16 NOTE — Assessment & Plan Note (Signed)
Last A1c 6.9, he remains as active as possible and eat healthy. Plan: Check A1c

## 2014-05-16 NOTE — Assessment & Plan Note (Signed)
Well-controlled, no change, check a bmp

## 2014-05-17 ENCOUNTER — Telehealth: Payer: Self-pay | Admitting: Internal Medicine

## 2014-05-17 NOTE — Telephone Encounter (Signed)
Relevant patient education assigned to patient using Emmi. ° °

## 2014-06-07 ENCOUNTER — Other Ambulatory Visit: Payer: Self-pay | Admitting: Internal Medicine

## 2014-06-12 ENCOUNTER — Other Ambulatory Visit: Payer: Self-pay | Admitting: Internal Medicine

## 2014-07-03 ENCOUNTER — Ambulatory Visit: Payer: Medicare Other | Admitting: Internal Medicine

## 2014-07-05 ENCOUNTER — Other Ambulatory Visit: Payer: Self-pay

## 2014-07-05 DIAGNOSIS — Z23 Encounter for immunization: Secondary | ICD-10-CM

## 2014-07-16 ENCOUNTER — Other Ambulatory Visit: Payer: Self-pay | Admitting: Internal Medicine

## 2014-07-19 ENCOUNTER — Other Ambulatory Visit: Payer: Self-pay | Admitting: Internal Medicine

## 2014-07-26 ENCOUNTER — Telehealth: Payer: Self-pay | Admitting: Internal Medicine

## 2014-07-26 NOTE — Telephone Encounter (Signed)
Spoke with Pt, he just wanted to inform us that we will be receiving a PA form for Januvia 100 mg. I informed him Shanda Bumps does the PA's for [place] and she will get to the PA as soon as she can. Pt verbalized understanding.

## 2014-07-26 NOTE — Telephone Encounter (Signed)
Caller name: Jyair  Call back [phone]   Reason for call:  Pt would like a return call regarding his Rx JANUVIA 100 MG tablet.  States that his insurance will be contacting us for the PA; advised that I would send message to person who handles the PA's, but he insisted that he speak to Dr. Leta Jungling CMA for more info.

## 2014-07-26 NOTE — Telephone Encounter (Signed)
Prior authorization for Januvia 100mg  initiated on [email or web address]. Awaiting determination. JG//CMA

## 2014-07-30 ENCOUNTER — Telehealth: Payer: Self-pay | Admitting: Internal Medicine

## 2014-07-30 ENCOUNTER — Encounter: Payer: Self-pay | Admitting: *Deleted

## 2014-07-30 NOTE — Telephone Encounter (Signed)
Caller name: Jarrah Relation to pt: Call back [phone] Pharmacy:[facility] - [place], [place] - [address]   Reason for call: Pt states that the pharmacy can not  refill JANUVIA 100 MG tablet. Pt asked to be called back. Please advise.

## 2014-07-30 NOTE — Telephone Encounter (Signed)
Spoke with Pt,Pt informed me that [facility] states that they have not received any information for his medication, I informed him that we sent PA information on 07/26/2014 and received more information request from [facility] this weekend. Pt gave number to call for pharmacy [phone]. Pt also informed me that he is completely out of medication.

## 2014-08-02 NOTE — Telephone Encounter (Signed)
[employer] approved effective 07/02/2014 through 08/01/2015. [employer] stated they will contact patient. JG//CMA

## 2014-08-05 ENCOUNTER — Ambulatory Visit (INDEPENDENT_AMBULATORY_CARE_PROVIDER_SITE_OTHER): Payer: Medicare Other | Admitting: Internal Medicine

## 2014-08-05 ENCOUNTER — Ambulatory Visit: Payer: Medicare Other | Admitting: Internal Medicine

## 2014-08-05 ENCOUNTER — Encounter: Payer: Self-pay | Admitting: Internal Medicine

## 2014-08-05 ENCOUNTER — Ambulatory Visit (HOSPITAL_BASED_OUTPATIENT_CLINIC_OR_DEPARTMENT_OTHER)
Admission: RE | Admit: 2014-08-05 | Discharge: 2014-08-05 | Disposition: A | Discharge status: Home / Self Care | Payer: Medicare Other | Source: Ambulatory Visit | Attending: Internal Medicine | Admitting: Internal Medicine

## 2014-08-05 VITALS — BP 125/70 | HR 88 | Temp 97.7°F | Wt 218.2 lb

## 2014-08-05 DIAGNOSIS — J209 Acute bronchitis, unspecified: Secondary | ICD-10-CM | POA: Diagnosis not present

## 2014-08-05 MED ORDER — DOXYCYCLINE HYCLATE 100 MG PO TABS: 100.0000 mg | ORAL_TABLET | Freq: Two times a day (BID) | ORAL | Status: DC

## 2014-08-05 NOTE — Progress Notes (Signed)
Pre visit review using our clinic review tool, if applicable. No additional management support is needed unless otherwise documented below in the visit note. 

## 2014-08-05 NOTE — Patient Instructions (Signed)
Stop by the first floor and get the XR   Rest, fluids , tylenol If  cough, take Mucinex DM or Robitussin DM as  needed  If nasal  congestion use OTC Nasocort: 2 nasal sprays on each side of the nose daily until you feel better   Take the antibiotic as prescribed  (doxycycline) Call if not gradually better over the next  10 days Call anytime if the symptoms are severe, you have high fever, short of breath, chest pain

## 2014-08-05 NOTE — Progress Notes (Signed)
Subjective:    Patient ID: Bradley [name], male    DOB: [birth year], 73 y.o.   MRN: [id number]  DOS:  08/05/2014 Type of visit - description : acute, here w/ wife Interval history: Symptoms started suddenly 5 days ago with severe cough, worse at night, green sputum production, unable to sleep. OTC Robitussin is helping to some extent. He had a flu shot.  ROS Denies fever chills No sinus pain or congestion. No chest pain, shortness of breath is slightly below baseline. No nausea vomiting + Bronchial congestion.  Past Medical History  Diagnosis Date  . HTN (hypertension)   . HLD (hyperlipidemia)   . BPH (benign prostatic hyperplasia)     Saw Dr [name] 2004, normal renal u/s  . CHF (congestive heart failure)     Thought primarily to be non-systolic although EF down (EF 65-78% 12/2010, down to 35-40% 09/05/11), cath 2008 with no CAD, nuclear study 07/2011 showing Small area of reversibility in the distal ant/lat wall the left ventricle suspicious for ischemia/septal wall HK but felt to be low risk  (per D/C Summary 07/2011)  . Pleural effusion 2008    S/p decortication  . Insomnia   . Atrial fibrillation     s/p AV node ablation & BiV PPM implantation 09/08/11 (op dictation pending)  . Pulmonary HTN     per cath 2008  . Peripheral vascular disease     ??  . Anemia     intermittent  . Pacemaker   . Heart murmur   . Type II diabetes mellitus 1999  . Migraines   . Arthritis     "?back" (09/27/2012)  . On home oxygen therapy     "sleep w/2L q hs" (09/27/2012)  . 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 CAD (coronary artery disease)   . Abnormal CT scan, chest 2012    CT chest, several lymphadenopathies. Sees pulmonary    Past Surgical History  Procedure Laterality Date  . Pleural scarification    . Lung decortication    . Colonoscopy  03/10/11    normal  . Pneumothorax with fibrothorax  ~ 2010  . Insert / replace / remove pacemaker  09/08/11    pacemaker placement  . Tonsillectomy      "as a kid" (09/27/2012)  . Cardiac catheterization      "couple times; never had balloon or stent" (09/27/2012)    History   Social History  . Marital Status: Married    Spouse Name: [place]    Number of Children: 1  . 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 Smoking status: Former Smoker -- 2.50 packs/day for 25 years    Types: Cigarettes    Quit date: 10/11/1980  . Smokeless tobacco: Never Used     Comment: used to smoke 2.5 ppd for 25 years  . Alcohol Use: Yes     Comment: 09/27/2012 "occasionally once a year; if that"  . Drug Use: No  . Sexual Activity: No   Other Topics Concern  . Not on file   Social History Narrative   Moved from [place] 2003.Mar[place] Kitchen   He lives w/ his wife                  Medication List       This list is accurate as of: 08/05/14 11:59 PM.  Always use your most  recent med list.               ADVICOR 1000-20 MG 24 hr tablet  Generic drug:  niacin-lovastatin  TAKE 1 TABLET AT BEDTIME     amLODipine 10 MG tablet  Commonly known as:  NORVASC  TAKE 1 TABLET DAILY     aspirin 81 MG tablet  Take 81 mg by mouth daily.     carvedilol 25 MG tablet  Commonly known as:  COREG  TAKE 1 AND 1/2 TABLETS TWICE A DAY WITH MEALS.     doxycycline 100 MG tablet  Commonly known as:  VIBRA-TABS  Take 1 tablet (100 mg total) by mouth 2 (two) times daily.     furosemide 40 MG tablet  Commonly known as:  LASIX  TAKE ONE AND ONE-HALF TABLETS (60 MG) TWICE A DAY     JANUVIA 100 MG tablet  Generic drug:  sitaGLIPtin  TAKE 1 TABLET DAILY     losartan 100 MG tablet  Commonly known as:  COZAAR  TAKE 1 TABLET DAILY     multivitamins ther. w/minerals Tabs tablet  Take 1 tablet by mouth daily.     nitroGLYCERIN 0.4 MG SL tablet  Commonly known as:  NITROSTAT  Place 1 tablet (0.4 mg total) under the tongue every 5 (five) minutes as needed  for chest pain.     ONE TOUCH ULTRA TEST test strip  Generic drug:  glucose blood  USE AS DIRECTED     pantoprazole 40 MG tablet  Commonly known as:  PROTONIX  TAKE 1 TABLET EVERY MORNING     zolpidem 10 MG tablet  Commonly known as:  AMBIEN  Take 1 tablet (10 mg total) by mouth at bedtime as needed. For sleep           Objective:   Physical Exam BP 125/70  Pulse 88  Temp(Src) 97.7 F (36.5 C) (Oral)  Wt 218 lb 4 oz (98.998 kg)  SpO2 88% Initial O2 sat 88, later I recheck it was 94% in room air. General -- alert, well-developed, NAD.  HEENT-- Not pale.  R Ear-- normal L ear-- normal Face symmetric, sinuses not tender to palpation. Nose slt congested. Lungs -- normal respiratory effort, no intercostal retractions, no accessory muscle use, and decreased breath sounds, slt increased exp time, few ronchi. No increase WOB Heart-- normal rate, regular rhythm, no murmur.  Extremities-- no pretibial edema bilaterally  Neurologic--  alert & oriented X3.   Psych-- Cognition and judgment appear intact. Cooperative with normal attention span and concentration. No anxious or depressed appearing.        Assessment & Plan:    Bronchitis   patient with multiple medical problems with respiratory symptoms for the last 5 days, afebrile.  O2 sat initially low,  uses oxygen at home and did not bring his oxygen , no acute distress. Plan: Chest x-ray to be sure he does not have pneumonia See instructions  Also having back ache, since after his dog pulled the leash really hard. Tylenol not  helping, recommend short treatment with ibuprofen as needed, GI side effects discussed

## 2014-08-09 ENCOUNTER — Other Ambulatory Visit: Payer: Self-pay | Admitting: Internal Medicine

## 2014-08-21 ENCOUNTER — Encounter: Payer: Self-pay | Admitting: *Deleted

## 2014-08-22 ENCOUNTER — Ambulatory Visit (HOSPITAL_BASED_OUTPATIENT_CLINIC_OR_DEPARTMENT_OTHER)
Admission: RE | Admit: 2014-08-22 | Discharge: 2014-08-22 | Disposition: A | Discharge status: Home / Self Care | Payer: Medicare Other | Source: Ambulatory Visit | Attending: Internal Medicine | Admitting: Internal Medicine

## 2014-08-22 ENCOUNTER — Encounter (HOSPITAL_COMMUNITY): Payer: Self-pay | Admitting: General Practice

## 2014-08-22 ENCOUNTER — Ambulatory Visit (INDEPENDENT_AMBULATORY_CARE_PROVIDER_SITE_OTHER): Payer: Medicare Other | Admitting: Internal Medicine

## 2014-08-22 ENCOUNTER — Encounter: Payer: Self-pay | Admitting: Internal Medicine

## 2014-08-22 ENCOUNTER — Inpatient Hospital Stay (HOSPITAL_BASED_OUTPATIENT_CLINIC_OR_DEPARTMENT_OTHER)
Admission: AD | Admit: 2014-08-22 | Discharge: 2014-08-25 | DRG: 189 | Disposition: A | Discharge status: Home / Self Care | Payer: Medicare Other | Source: Ambulatory Visit | Attending: Internal Medicine | Admitting: Internal Medicine

## 2014-08-22 ENCOUNTER — Ambulatory Visit: Payer: Medicare Other | Admitting: Internal Medicine

## 2014-08-22 VITALS — BP 138/68 | HR 75 | Temp 98.3°F | Wt 213.0 lb

## 2014-08-22 DIAGNOSIS — I10 Essential (primary) hypertension: Secondary | ICD-10-CM | POA: Diagnosis present

## 2014-08-22 DIAGNOSIS — F17210 Nicotine dependence, cigarettes, uncomplicated: Secondary | ICD-10-CM | POA: Diagnosis present

## 2014-08-22 DIAGNOSIS — J9601 Acute respiratory failure with hypoxia: Secondary | ICD-10-CM

## 2014-08-22 DIAGNOSIS — I5042 Chronic combined systolic (congestive) and diastolic (congestive) heart failure: Secondary | ICD-10-CM

## 2014-08-22 DIAGNOSIS — I739 Peripheral vascular disease, unspecified: Secondary | ICD-10-CM | POA: Diagnosis present

## 2014-08-22 DIAGNOSIS — I369 Nonrheumatic tricuspid valve disorder, unspecified: Secondary | ICD-10-CM | POA: Diagnosis not present

## 2014-08-22 DIAGNOSIS — J9612 Chronic respiratory failure with hypercapnia: Secondary | ICD-10-CM | POA: Diagnosis present

## 2014-08-22 DIAGNOSIS — Z79899 Other long term (current) drug therapy: Secondary | ICD-10-CM | POA: Diagnosis not present

## 2014-08-22 DIAGNOSIS — J209 Acute bronchitis, unspecified: Secondary | ICD-10-CM

## 2014-08-22 DIAGNOSIS — J9621 Acute and chronic respiratory failure with hypoxia: Secondary | ICD-10-CM | POA: Diagnosis not present

## 2014-08-22 DIAGNOSIS — J9610 Chronic respiratory failure, unspecified whether with hypoxia or hypercapnia: Secondary | ICD-10-CM

## 2014-08-22 DIAGNOSIS — Z950 Presence of cardiac pacemaker: Secondary | ICD-10-CM

## 2014-08-22 DIAGNOSIS — I5032 Chronic diastolic (congestive) heart failure: Secondary | ICD-10-CM

## 2014-08-22 DIAGNOSIS — J208 Acute bronchitis due to other specified organisms: Secondary | ICD-10-CM

## 2014-08-22 DIAGNOSIS — I25810 Atherosclerosis of coronary artery bypass graft(s) without angina pectoris: Secondary | ICD-10-CM | POA: Diagnosis not present

## 2014-08-22 DIAGNOSIS — R0602 Shortness of breath: Secondary | ICD-10-CM | POA: Diagnosis present

## 2014-08-22 DIAGNOSIS — M1990 Unspecified osteoarthritis, unspecified site: Secondary | ICD-10-CM | POA: Diagnosis present

## 2014-08-22 DIAGNOSIS — E119 Type 2 diabetes mellitus without complications: Secondary | ICD-10-CM | POA: Diagnosis present

## 2014-08-22 DIAGNOSIS — J9611 Chronic respiratory failure with hypoxia: Secondary | ICD-10-CM | POA: Diagnosis not present

## 2014-08-22 DIAGNOSIS — E785 Hyperlipidemia, unspecified: Secondary | ICD-10-CM | POA: Diagnosis present

## 2014-08-22 DIAGNOSIS — R0902 Hypoxemia: Secondary | ICD-10-CM | POA: Diagnosis not present

## 2014-08-22 DIAGNOSIS — I5033 Acute on chronic diastolic (congestive) heart failure: Secondary | ICD-10-CM | POA: Diagnosis present

## 2014-08-22 DIAGNOSIS — Z9981 Dependence on supplemental oxygen: Secondary | ICD-10-CM

## 2014-08-22 DIAGNOSIS — J441 Chronic obstructive pulmonary disease with (acute) exacerbation: Secondary | ICD-10-CM | POA: Diagnosis not present

## 2014-08-22 HISTORY — DX: Unspecified chronic bronchitis: J42

## 2014-08-22 LAB — COMPREHENSIVE METABOLIC PANEL
ALT: 15 U/L (ref 0–53)
AST: 21 U/L (ref 0–37)
Albumin: 3.7 g/dL (ref 3.5–5.2)
Alkaline Phosphatase: 73 U/L (ref 39–117)
Anion gap: 13 (ref 5–15)
BUN: 15 mg/dL (ref 6–23)
CO2: 29 mEq/L (ref 19–32)
Calcium: 9.3 mg/dL (ref 8.4–10.5)
Chloride: 95 mEq/L — ABNORMAL LOW (ref 96–112)
Creatinine, Ser: 0.89 mg/dL (ref 0.50–1.35)
GFR calc Af Amer: >90 mL/min (ref >90)
GFR calc non Af Amer: 83 mL/min — ABNORMAL LOW (ref >90)
Glucose, Bld: 166 mg/dL — ABNORMAL HIGH (ref 70–99)
Potassium: 4.3 mEq/L (ref 3.7–5.3)
Sodium: 137 mEq/L (ref 137–147)
Total Bilirubin: 0.9 mg/dL (ref 0.3–1.2)
Total Protein: 7.4 g/dL (ref 6.0–8.3)

## 2014-08-22 LAB — CBC WITH DIFFERENTIAL/PLATELET
Basophils Absolute: 0 10*3/uL (ref 0.0–0.1)
Basophils Relative: 0 % (ref 0–1)
Eosinophils Absolute: 0 10*3/uL (ref 0.0–0.7)
Eosinophils Relative: 0 % (ref 0–5)
HCT: 41.5 % (ref 39.0–52.0)
Hemoglobin: 14.4 g/dL (ref 13.0–17.0)
Lymphocytes Relative: 7 % — ABNORMAL LOW (ref 12–46)
Lymphs Abs: 1.2 10*3/uL (ref 0.7–4.0)
MCH: 30.6 pg (ref 26.0–34.0)
MCHC: 34.7 g/dL (ref 30.0–36.0)
MCV: 88.3 fL (ref 78.0–100.0)
Monocytes Absolute: 1.2 10*3/uL — ABNORMAL HIGH (ref 0.1–1.0)
Monocytes Relative: 7 % (ref 3–12)
Neutro Abs: 14.8 10*3/uL — ABNORMAL HIGH (ref 1.7–7.7)
Neutrophils Relative %: 86 % — ABNORMAL HIGH (ref 43–77)
Platelets: 208 10*3/uL (ref 150–400)
RBC: 4.7 MIL/uL (ref 4.22–5.81)
RDW: 14.5 % (ref 11.5–15.5)
WBC: 17.2 10*3/uL — ABNORMAL HIGH (ref 4.0–10.5)

## 2014-08-22 LAB — GLUCOSE, CAPILLARY
Glucose-Capillary: 136 mg/dL — ABNORMAL HIGH (ref 70–99)
Glucose-Capillary: 199 mg/dL — ABNORMAL HIGH (ref 70–99)

## 2014-08-22 LAB — MRSA PCR SCREENING: MRSA by PCR: NEGATIVE

## 2014-08-22 MED ORDER — ASPIRIN EC 81 MG PO TBEC
81.0000 mg | DELAYED_RELEASE_TABLET | Freq: Every day | ORAL | Status: DC
  Administered 2014-08-23 – 2014-08-25 (×3): 81 mg via ORAL
  Filled 2014-08-22 (×3): qty 1

## 2014-08-22 MED ORDER — ACETAMINOPHEN 325 MG PO TABS: 650.0000 mg | ORAL_TABLET | Freq: Four times a day (QID) | ORAL | Status: DC | PRN

## 2014-08-22 MED ORDER — INSULIN ASPART 100 UNIT/ML ~~LOC~~ SOLN: 0.0000 [IU] | Freq: Every day | SUBCUTANEOUS | Status: DC

## 2014-08-22 MED ORDER — ASPIRIN 81 MG PO TABS: 81.0000 mg | ORAL_TABLET | Freq: Every day | ORAL | Status: DC

## 2014-08-22 MED ORDER — ONDANSETRON HCL 4 MG PO TABS: 4.0000 mg | ORAL_TABLET | Freq: Four times a day (QID) | ORAL | Status: DC | PRN

## 2014-08-22 MED ORDER — METHYLPREDNISOLONE SODIUM SUCC 125 MG IJ SOLR
80.0000 mg | Freq: Two times a day (BID) | INTRAMUSCULAR | Status: DC
  Administered 2014-08-22 – 2014-08-23 (×2): 80 mg via INTRAVENOUS
  Filled 2014-08-22 (×4): qty 1.28

## 2014-08-22 MED ORDER — PANTOPRAZOLE SODIUM 40 MG PO TBEC
40.0000 mg | DELAYED_RELEASE_TABLET | Freq: Every day | ORAL | Status: DC
Start: 2014-08-23 — End: 2014-08-25
  Administered 2014-08-23 – 2014-08-25 (×3): 40 mg via ORAL
  Filled 2014-08-22 (×3): qty 1

## 2014-08-22 MED ORDER — IPRATROPIUM-ALBUTEROL 0.5-2.5 (3) MG/3ML IN SOLN: 3.0000 mL | Freq: Four times a day (QID) | RESPIRATORY_TRACT | Status: DC

## 2014-08-22 MED ORDER — FUROSEMIDE 20 MG PO TABS
60.0000 mg | ORAL_TABLET | Freq: Two times a day (BID) | ORAL | Status: DC
  Administered 2014-08-22 – 2014-08-25 (×6): 60 mg via ORAL
  Filled 2014-08-22 (×8): qty 1

## 2014-08-22 MED ORDER — CARVEDILOL 25 MG PO TABS
25.0000 mg | ORAL_TABLET | Freq: Two times a day (BID) | ORAL | Status: DC
  Administered 2014-08-22 – 2014-08-25 (×6): 25 mg via ORAL
  Filled 2014-08-22 (×8): qty 1

## 2014-08-22 MED ORDER — LOSARTAN POTASSIUM 50 MG PO TABS
100.0000 mg | ORAL_TABLET | Freq: Every day | ORAL | Status: DC
  Administered 2014-08-23 – 2014-08-25 (×3): 100 mg via ORAL
  Filled 2014-08-22 (×3): qty 2

## 2014-08-22 MED ORDER — BUDESONIDE 0.25 MG/2ML IN SUSP
0.2500 mg | Freq: Four times a day (QID) | RESPIRATORY_TRACT | Status: DC
  Administered 2014-08-22 – 2014-08-25 (×11): 0.25 mg via RESPIRATORY_TRACT
  Filled 2014-08-22 (×16): qty 2

## 2014-08-22 MED ORDER — LINAGLIPTIN 5 MG PO TABS
5.0000 mg | ORAL_TABLET | Freq: Every day | ORAL | Status: DC
  Administered 2014-08-23 – 2014-08-25 (×3): 5 mg via ORAL
  Filled 2014-08-22 (×3): qty 1

## 2014-08-22 MED ORDER — NITROGLYCERIN 0.4 MG SL SUBL: 0.4000 mg | SUBLINGUAL_TABLET | SUBLINGUAL | Status: DC | PRN

## 2014-08-22 MED ORDER — METHYLPREDNISOLONE SODIUM SUCC 125 MG IJ SOLR: 60.0000 mg | Freq: Four times a day (QID) | INTRAMUSCULAR | Status: DC

## 2014-08-22 MED ORDER — SODIUM CHLORIDE 0.9 % IJ SOLN
3.0000 mL | Freq: Two times a day (BID) | INTRAMUSCULAR | Status: DC
  Administered 2014-08-22 – 2014-08-25 (×5): 3 mL via INTRAVENOUS

## 2014-08-22 MED ORDER — ACETAMINOPHEN 650 MG RE SUPP: 650.0000 mg | Freq: Four times a day (QID) | RECTAL | Status: DC | PRN

## 2014-08-22 MED ORDER — INSULIN ASPART 100 UNIT/ML ~~LOC~~ SOLN
0.0000 [IU] | Freq: Three times a day (TID) | SUBCUTANEOUS | Status: DC
  Administered 2014-08-22: 2 [IU] via SUBCUTANEOUS
  Administered 2014-08-23 (×2): 5 [IU] via SUBCUTANEOUS

## 2014-08-22 MED ORDER — IPRATROPIUM-ALBUTEROL 0.5-2.5 (3) MG/3ML IN SOLN: 3.0000 mL | RESPIRATORY_TRACT | Status: DC | PRN

## 2014-08-22 MED ORDER — LEVOFLOXACIN IN D5W 750 MG/150ML IV SOLN
750.0000 mg | INTRAVENOUS | Status: DC
  Filled 2014-08-22: qty 150

## 2014-08-22 MED ORDER — ONDANSETRON HCL 4 MG/2ML IJ SOLN: 4.0000 mg | Freq: Four times a day (QID) | INTRAMUSCULAR | Status: DC | PRN

## 2014-08-22 MED ORDER — ZOLPIDEM TARTRATE 5 MG PO TABS: 10.0000 mg | ORAL_TABLET | Freq: Every evening | ORAL | Status: DC | PRN

## 2014-08-22 MED ORDER — ZOLPIDEM TARTRATE 5 MG PO TABS: 5.0000 mg | ORAL_TABLET | Freq: Every evening | ORAL | Status: DC | PRN

## 2014-08-22 MED ORDER — LEVOFLOXACIN IN D5W 750 MG/150ML IV SOLN
750.0000 mg | Freq: Once | INTRAVENOUS | Status: AC
  Administered 2014-08-22: 750 mg via INTRAVENOUS
  Filled 2014-08-22 (×2): qty 150

## 2014-08-22 MED ORDER — HEPARIN SODIUM (PORCINE) 5000 UNIT/ML IJ SOLN
5000.0000 [IU] | Freq: Three times a day (TID) | INTRAMUSCULAR | Status: DC
  Administered 2014-08-22 – 2014-08-25 (×7): 5000 [IU] via SUBCUTANEOUS
  Filled 2014-08-22 (×12): qty 1

## 2014-08-22 MED ORDER — AMLODIPINE BESYLATE 10 MG PO TABS
10.0000 mg | ORAL_TABLET | Freq: Every day | ORAL | Status: DC
  Administered 2014-08-23 – 2014-08-25 (×3): 10 mg via ORAL
  Filled 2014-08-22 (×3): qty 1

## 2014-08-22 MED ORDER — ALBUTEROL SULFATE (2.5 MG/3ML) 0.083% IN NEBU
2.5000 mg | INHALATION_SOLUTION | Freq: Once | RESPIRATORY_TRACT | Status: AC
  Administered 2014-08-22: 2.5 mg via RESPIRATORY_TRACT

## 2014-08-22 MED ORDER — IPRATROPIUM-ALBUTEROL 0.5-2.5 (3) MG/3ML IN SOLN
3.0000 mL | RESPIRATORY_TRACT | Status: DC
  Administered 2014-08-22 – 2014-08-23 (×4): 3 mL via RESPIRATORY_TRACT
  Filled 2014-08-22 (×4): qty 3

## 2014-08-22 NOTE — H&P (Signed)
[facility] History and Physical  Bradley [name] RUE:[id number] DOB: 1941/08/06 DOA: 08/22/2014  Referring physician: [name] PCP: [name], MD  Specialists:   Chief Complaint: SOB  HPI: Bradley [name] is a 73 y.o. male  With a hx of HTN, HLD, systolic and diastolic chf who presents from outpatient clinic after being treated for bronchitis as an outpatient with doxycycline. Pt reportedly initially showed some improvement, however became more dyspneic and sob. A cxr was found to be unremarkable without evidence of PNA or pulm edema. He was noted to have O2 sats in the mid-80's on O2. The patient was then directly admitted to stepdown to the hospitalist service. Pulmonary was consulted as well and asked to assist in management.  Review of Systems:  Per above, the remainder of the 10pt ros reviewed and are neg  Past Medical History  Diagnosis Date  . HTN (hypertension)   . HLD (hyperlipidemia)   . BPH (benign prostatic hyperplasia)     Saw Dr [name] 2004, normal renal u/s  . CHF (congestive heart failure)     Thought primarily to be non-systolic although EF down (EF 14-78% 12/2010, down to 35-40% 09/05/11), cath 2008 with no CAD, nuclear study 07/2011 showing Small area of reversibility in the distal ant/lat wall the left ventricle suspicious for ischemia/septal wall HK but felt to be low risk  (per D/C Summary 07/2011)  . Pleural effusion 2008    S/p decortication  . Insomnia   . Atrial fibrillation     s/p AV node ablation & BiV PPM implantation 09/08/11 (op dictation pending)  . Pulmonary HTN     per cath 2008  . Peripheral vascular disease     ??  . Anemia     intermittent  . Pacemaker   . Heart murmur   . Type II diabetes mellitus 1999  . Migraines   . Arthritis     "?back" (09/27/2012)  . On home oxygen therapy     "sleep w/2L q hs" (09/27/2012)  . 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 CAD (coronary artery disease)   . Abnormal CT scan,  chest 2012    CT chest, several lymphadenopathies. Sees pulmonary   Past Surgical History  Procedure Laterality Date  . Pleural scarification    . Lung decortication    . Colonoscopy  03/10/11    normal  . Pneumothorax with fibrothorax  ~ 2010  . Insert / replace / remove pacemaker  09/08/11    pacemaker placement  . Tonsillectomy      "as a kid" (09/27/2012)  . Cardiac catheterization      "couple times; never had balloon or stent" (09/27/2012)   Social History:  reports that he quit smoking about 33 years ago. His smoking use included Cigarettes. He has a 62.5 pack-year smoking history. He has never used smokeless tobacco. He reports that he drinks alcohol. He reports that he does not use illicit drugs.  where does patient live--home, ALF, SNF? and with whom if at home?  Can patient participate in ADLs?  Allergies  Allergen Reactions  . Hydrocodone Other (See Comments)    "given to him in the hospital; went thru withdrawals once home; dr said not to take it again" (09/27/2012)  . Tramadol Other (See Comments)    "given to him in the hospital; went thru withdrawals once home; dr said not to take it again" (09/27/2012)  . Tadalafil Other (See Comments)  headache, backache  . Avelox [Moxifloxacin Hydrochloride] Itching and Other (See Comments)    Headache    Family History  Problem Relation Age of Onset  . Diabetes Father   . Breast cancer Maternal Aunt   . Prostate cancer Neg Hx   . Colon cancer Neg Hx   . Coronary artery disease Father   . Polycythemia Mother     (be sure to complete)  Prior to Admission medications   Medication Sig Start Date End Date Taking? Authorizing Provider  ADVICOR 1000-20 MG 24 hr tablet TAKE 1 TABLET AT BEDTIME 06/12/14  Yes [name], MD  amLODipine (NORVASC) 10 MG tablet TAKE 1 TABLET DAILY 06/07/14  Yes [name], MD  aspirin 81 MG tablet Take 81 mg by mouth daily.   Yes Historical Provider, MD  carvedilol (COREG) 25 MG tablet TAKE ONE  AND ONE-HALF TABLETS TWICE A DAY WITH MEALS 08/09/14  Yes [name], MD  furosemide (LASIX) 40 MG tablet TAKE ONE AND ONE-HALF TABLETS (60 MG) TWICE A DAY   Yes [name], MD  JANUVIA 100 MG tablet TAKE 1 TABLET DAILY   Yes [name], MD  losartan (COZAAR) 100 MG tablet TAKE 1 TABLET DAILY 07/16/14  Yes [name], MD  Multiple Vitamins-Minerals (MULTIVITAMINS THER. W/MINERALS) TABS tablet Take 1 tablet by mouth daily. 09/14/13  Yes [name], MD  nitroGLYCERIN (NITROSTAT) 0.4 MG SL tablet Place 1 tablet (0.4 mg total) under the tongue every 5 (five) minutes as needed for chest pain. 07/10/12  Yes Laveda Norman, MD  ONE TOUCH ULTRA TEST test strip USE AS DIRECTED 07/19/14  Yes [name], MD  pantoprazole (PROTONIX) 40 MG tablet TAKE 1 TABLET EVERY MORNING 04/08/14  Yes [name], MD  zolpidem (AMBIEN) 10 MG tablet Take 1 tablet (10 mg total) by mouth at bedtime as needed. For sleep 09/14/13  Yes [name], MD   Physical Exam: Filed Vitals:   08/22/14 1600 08/22/14 1630  BP:  139/62  Pulse:  79  Temp:  98.6 F (37 C)  TempSrc:  Oral  Resp: 42 19  Height:  5\' 9"  (1.753 m)  Weight:  94 kg (207 lb 3.7 oz)  SpO2:  97%     General:  Awake, in nad  Eyes: PERRL B  ENT: membranes moist, dentition fair  Neck: trachea midline, neck supple  Cardiovascular: regular, s1, s2  Respiratory: breathing with pursed lips, decreased BS throughout  Abdomen: soft,nondistended  Skin: normal skin turgor, no abnormal skin lesions seen  Musculoskeletal: perfused,no clubbing  Psychiatric: mood/affect normal //no auditory visual hallucinations  Neurologic: cn2-12 grossly intact, strength/sensation intact  Labs on Admission:  Basic Metabolic Panel: No results for input(s): NA, K, CL, CO2, GLUCOSE, BUN, CREATININE, CALCIUM, MG, PHOS in the last 168 hours. Liver Function Tests: No results for input(s): AST, ALT, ALKPHOS, BILITOT, PROT, ALBUMIN in the last 168 hours. No results for input(s): LIPASE,  AMYLASE in the last 168 hours. No results for input(s): AMMONIA in the last 168 hours. CBC: No results for input(s): WBC, NEUTROABS, HGB, HCT, MCV, PLT in the last 168 hours. Cardiac Enzymes: No results for input(s): CKTOTAL, CKMB, CKMBINDEX, TROPONINI in the last 168 hours.  BNP (last 3 results) No results for input(s): PROBNP in the last 8760 hours. CBG: No results for input(s): GLUCAP in the last 168 hours.  Radiological Exams on Admission: Dg Chest 2 View  08/22/2014   CLINICAL  DATA:  Two weeks of cough; history of CHF and pulmonary hypertension and remote tobacco use  EXAM: CHEST  2 VIEW  COMPARISON:  PA and lateral chest of August 05, 2014  FINDINGS: The lungs are adequately inflated. The interstitial markings remain increased and exhibit near confluence at the right lung base and in the left infrahilar region. There is no significant pleural effusion. The cardiac silhouette is top-normal in size. The central pulmonary vascularity is prominent but stable. The permanent pacemaker is in appropriate position radiographically. The bony structures exhibit no acute abnormalities.  IMPRESSION: There has not been significant interval change in the appearance of the chest since the previous study. Chronic bronchitic changes are present. The chronic prominence of the pulmonary vascularity centrally is consistent with the history of pulmonary hypertension. There is no evidence of acute CHF nor of pneumonia.   Electronically Signed   By: David  [place]   On: 08/22/2014 13:46    Assessment/Plan Principal Problem:   Acute respiratory failure with hypoxia Active Problems:   Essential hypertension   Chronic respiratory failure   Chronic diastolic heart failure   Pacemaker-[employer]tr[employer]   CAD (coronary artery disease) of artery bypass graft   Acute bronchitis   1. Acute resp failure with hypoxia 1. Pulmonary has been consulted. Awaiting recs 2. Will cont on scheduled nebs and steroids 3. Will cont  on empiric levaquin 4. Admitted to stepdown 2. HTN 1. BP stable and controlled 2. Will cont home meds for now 3. Diastolic CHF 1. Appears euvolemic 2. Would follow I/o and daily wts 4. CAD 1. Stable 2. No CP 5. Bronchitis 1. Per above, will cont on empiric levaquin 6. DVT prophylaxis 1. Heparin subq  Code Status: Full (must indicate code status--if unknown or must be presumed, indicate so) Family Communication: Pt in room (indicate person spoken with, if applicable, with phone number if by telephone) Disposition Plan: Pending (indicate anticipated LOS)  Time spent: 35min  Tiphany Fayson K [facility] Pager [phone]  If 7PM-7AM, please contact night-coverage [email or web address] Password [email or web address] 08/22/2014, 4:53 PM

## 2014-08-22 NOTE — Progress Notes (Signed)
ANTIBIOTIC CONSULT NOTE - INITIAL  Pharmacy Consult:  Levaquin Indication:  COPD exacerbation   Allergies  Allergen Reactions  . Hydrocodone Other (See Comments)    "given to him in the hospital; went thru withdrawals once home; dr said not to take it again" (09/27/2012)  . Tramadol Other (See Comments)    "given to him in the hospital; went thru withdrawals once home; dr said not to take it again" (09/27/2012)  . Tadalafil Other (See Comments)    headache, backache  . Avelox [Moxifloxacin Hydrochloride] Itching and Other (See Comments)    Headache    Patient Measurements: Height: 5\' 9"  (175.3 cm) Weight: 207 lb 3.7 oz (94 kg) IBW/kg (Calculated) : 70.7  Vital Signs: Temp: 97.6 F (36.4 C) (11/12 1951) Temp Source: Oral (11/12 1951) BP: 125/63 mmHg (11/12 1958) Pulse Rate: 68 (11/12 1958) Intake/Output from this shift: Total I/O In: -  Out: 300 [Urine:300]  Labs:  Recent Labs  08/22/14 1900  WBC 17.2*  HGB 14.4  PLT 208  CREATININE 0.89   Estimated Creatinine Clearance: 83.6 mL/min (by C-G formula based on Cr of 0.89). No results for input(s): VANCOTROUGH, VANCOPEAK, VANCORANDOM, GENTTROUGH, GENTPEAK, GENTRANDOM, TOBRATROUGH, TOBRAPEAK, TOBRARND, AMIKACINPEAK, AMIKACINTROU, AMIKACIN in the last 72 hours.   Microbiology: Recent Results (from the past 720 hour(s))  MRSA PCR Screening     Status: None   Collection Time: 08/22/14  4:27 PM  Result Value Ref Range Status   MRSA by PCR NEGATIVE NEGATIVE Final    Comment:        The GeneXpert MRSA Assay (FDA approved for NASAL specimens only), is one component of a comprehensive MRSA colonization surveillance program. It is not intended to diagnose MRSA infection nor to guide or monitor treatment for MRSA infections.     Medical History: Past Medical History  Diagnosis Date  . HTN (hypertension)   . HLD (hyperlipidemia)   . BPH (benign prostatic hyperplasia)     Saw Dr [name] 2004, normal renal u/s   . CHF (congestive heart failure)     Thought primarily to be non-systolic although EF down (EF 41-63% 12/2010, down to 35-40% 09/05/11), cath 2008 with no CAD, nuclear study 07/2011 showing Small area of reversibility in the distal ant/lat wall the left ventricle suspicious for ischemia/septal wall HK but felt to be low risk  (per D/C Summary 07/2011)  . Pleural effusion 2008    S/p decortication  . Insomnia   . Atrial fibrillation     s/p AV node ablation & BiV PPM implantation 09/08/11 (op dictation pending)  . Pulmonary HTN     per cath 2008  . Peripheral vascular disease     ??  . Anemia     intermittent  . Pacemaker   . Heart murmur   . Type II diabetes mellitus 1999  . Migraines   . Arthritis     "?back" (09/27/2012)  . On home oxygen therapy     "sleep w/2L q hs" (09/27/2012)  . 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 CAD (coronary artery disease)   . Abnormal CT scan, chest 2012    CT chest, several lymphadenopathies. Sees pulmonary     Assessment: 52 YOM s/p recent treatment of bronchitis presented with complaint of SOB.  Pharmacy consulted to dose Levaquin for COPD exacerbation.  Baseline labs reviewed.   Goal of Therapy:  Resolution of COPD exac   Plan:  - LVQ 750mg  IV Q24H - Monitor  renal fxn, clinical progress to change abx to PO    Talesha Ellithorpe D. Laney Potashang, PharmD, BCPS Pager:  [phone] 08/22/2014, 8:11 PM

## 2014-08-22 NOTE — Patient Instructions (Addendum)
Please go to  [facility]  You will be directly admitted to the hospitalist service, admitting physician is Dr. Cena Benton

## 2014-08-22 NOTE — Progress Notes (Signed)
Pre visit review using our clinic review tool, if applicable. No additional management support is needed unless otherwise documented below in the visit note. 

## 2014-08-22 NOTE — Progress Notes (Signed)
Subjective:    Patient ID: Bradley [name], male    DOB: 21-Dec-1940, 73 y.o.   MRN: [id number]  DOS:  08/22/2014 Type of visit - description : no better  Interval history: Patient was recently seen with bronchitis, chest x-ray showed no acute problems. Took doxycycline and felt much better however symptoms started again about 2 or 3 days ago. + Cough, unable to sleep due to cough. Some epigastric discomfort with cough only. No chest pain. More short of breath than usual.   ROS Denies fever or chills No sinus pain or congestion No lower extremity edema, no orthopnea. No nausea, vomiting, diarrhea. No wheezing. Very little sputum production, clear, no hemoptysis   Past Medical History  Diagnosis Date  . HTN (hypertension)   . HLD (hyperlipidemia)   . BPH (benign prostatic hyperplasia)     Saw Dr [name] 2004, normal renal u/s  . CHF (congestive heart failure)     Thought primarily to be non-systolic although EF down (EF 29-52% 12/2010, down to 35-40% 09/05/11), cath 2008 with no CAD, nuclear study 07/2011 showing Small area of reversibility in the distal ant/lat wall the left ventricle suspicious for ischemia/septal wall HK but felt to be low risk  (per D/C Summary 07/2011)  . Pleural effusion 2008    S/p decortication  . Insomnia   . Atrial fibrillation     s/p AV node ablation & BiV PPM implantation 09/08/11 (op dictation pending)  . Pulmonary HTN     per cath 2008  . Peripheral vascular disease     ??  . Anemia     intermittent  . Pacemaker   . Heart murmur   . Type II diabetes mellitus 1999  . Migraines   . Arthritis     "?back" (09/27/2012)  . On home oxygen therapy     "sleep w/2L q hs" (09/27/2012)  . 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 CAD (coronary artery disease)   . Abnormal CT scan, chest 2012    CT chest, several lymphadenopathies. Sees pulmonary    Past Surgical History  Procedure Laterality Date  . Pleural  scarification    . Lung decortication    . Colonoscopy  03/10/11    normal  . Pneumothorax with fibrothorax  ~ 2010  . Insert / replace / remove pacemaker  09/08/11    pacemaker placement  . Tonsillectomy      "as a kid" (09/27/2012)  . Cardiac catheterization      "couple times; never had balloon or stent" (09/27/2012)    History   Social History  . Marital Status: Married    Spouse Name: [place]    Number of Children: 1  . 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 Smoking status: Former Smoker -- 2.50 packs/day for 25 years    Types: Cigarettes    Quit date: 10/11/1980  . Smokeless tobacco: Never Used     Comment: used to smoke 2.5 ppd for 25 years  . Alcohol Use: Yes     Comment: 09/27/2012 "occasionally once a year; if that"  . Drug Use: No  . Sexual Activity: No   Other Topics Concern  . Not on file   Social History Narrative   Moved from [place] 2003.[place]   He lives w/ his wife  Medication List       This list is accurate as of: 08/22/14 11:40 AM.  Always use your most recent med list.               ADVICOR 1000-20 MG 24 hr tablet  Generic drug:  niacin-lovastatin  TAKE 1 TABLET AT BEDTIME     amLODipine 10 MG tablet  Commonly known as:  NORVASC  TAKE 1 TABLET DAILY     aspirin 81 MG tablet  Take 81 mg by mouth daily.     carvedilol 25 MG tablet  Commonly known as:  COREG  TAKE ONE AND ONE-HALF TABLETS TWICE A DAY WITH MEALS     doxycycline 100 MG tablet  Commonly known as:  VIBRA-TABS  Take 1 tablet (100 mg total) by mouth 2 (two) times daily.     furosemide 40 MG tablet  Commonly known as:  LASIX  TAKE ONE AND ONE-HALF TABLETS (60 MG) TWICE A DAY     JANUVIA 100 MG tablet  Generic drug:  sitaGLIPtin  TAKE 1 TABLET DAILY     losartan 100 MG tablet  Commonly known as:  COZAAR  TAKE 1 TABLET DAILY     multivitamins ther. w/minerals Tabs tablet    Take 1 tablet by mouth daily.     nitroGLYCERIN 0.4 MG SL tablet  Commonly known as:  NITROSTAT  Place 1 tablet (0.4 mg total) under the tongue every 5 (five) minutes as needed for chest pain.     ONE TOUCH ULTRA TEST test strip  Generic drug:  glucose blood  USE AS DIRECTED     pantoprazole 40 MG tablet  Commonly known as:  PROTONIX  TAKE 1 TABLET EVERY MORNING     zolpidem 10 MG tablet  Commonly known as:  AMBIEN  Take 1 tablet (10 mg total) by mouth at bedtime as needed. For sleep           Objective:   Physical Exam BP 122/68 mmHg  Pulse 67  Temp(Src) 98.3 F (36.8 C) (Oral)  Wt 213 lb (96.616 kg)  SpO2 97% General -- alert, well-developed.  Has some portable oxygen, O2 sat satisfactory  Lungs -- normal respiratory effort, no intercostal retractions, no accessory muscle use, very decreased breath sounds bilaterally and throughout, increased expiratory time. Few rhonchi? Speaks in  incomplete sentences Heart-- seems regular  Extremities-- no pretibial edema bilaterally   Psych-- Cognition and judgment appear intact. Cooperative with normal attention span and concentration. No anxious or depressed appearing.        Assessment & Plan:    bronchitis? was recently seen with what we thought was bronchitis, took antibiotics, improved but for the last 2 or 3 days respiratory symptoms have returned. Plan: Breathing treatment Chest x-ray   Addendum: Chest x-ray came back unchanged from previous. The patient received a breathing treatment and he is not feeling any better. On exam he looks worse, mild to moderate tachypnea, persistent dry cough. O2 sat came back 86 on portable O2. Patient has multiple medical problems and a very small cardiovascular reserve, he is in moderate respiratory distress, etiology unclear, could be atypical pneumonia, progressing pulmonary hypertension or CHF.  Plan:  Admit to the hospitalist service, suggest pulmonary and cardiology  consult Discussed with Dr. Cena Benton, admit to stepdown at  [facility] Discussed the patient with pulmonary, asked them to see him while in-house. JP 2.10 pm   Today , I spent more than 50   min with  the patient: coordinating his care

## 2014-08-22 NOTE — Consult Note (Signed)
PULMONARY / CRITICAL CARE MEDICINE   Name: Bradley [name] MRN: [id number] DOB: 07/29/1941    ADMISSION DATE:  (Not on file) CONSULTATION DATE:  08/22/2014  REFERRING MD :  Bradley [name], Bradley [name] PCP  CHIEF COMPLAINT:  Dyspnea  INITIAL PRESENTATION: 73 y/o male with known COPD was admitted to [facility] on 11/12 with acute hypoxemic respiratory failure after being seen by his PCP.  STUDIES:    SIGNIFICANT EVENTS:   HISTORY OF PRESENT ILLNESS:  73 y/o male with known CHF and COPD was admitted on 11/12 to [facility] with acute hypoxemic respiratory failure. Was treated for bronchitis about a week ago.  He reported to his PCP on 11/12 in clinic that he had experienced 2-3 days of worsening dyspnea and cough.  He noted that the cough was keeping him up at night.  In his PCP office he was found to have tachypnea and hypoxemia with O2 sat of 86%. He was admitted to hospital from PCP office and PCCM was consulted for pulmonary evaluation.   PAST MEDICAL HISTORY :   has a past medical history of HTN (hypertension); HLD (hyperlipidemia); BPH (benign prostatic hyperplasia); CHF (congestive heart failure); Pleural effusion (2008); Insomnia; Atrial fibrillation; Pulmonary HTN; Peripheral vascular disease; Anemia; Pacemaker; Heart murmur; Type II diabetes mellitus (1999); Migraines; Arthritis; On home oxygen therapy; ICB (intracranial bleed) (06-2012); CAD (coronary artery disease); and Abnormal CT scan, chest (2012).  has past surgical history that includes Pleural scarification; Lung decortication; Colonoscopy (03/10/11); pneumothorax with fibrothorax (~ 2010); Insert / replace / remove pacemaker (09/08/11); Tonsillectomy; and Cardiac catheterization. Prior to Admission medications   Medication Sig Start Date End Date Taking? Authorizing Provider  ADVICOR 1000-20 MG 24 hr tablet TAKE 1 TABLET AT BEDTIME 06/12/14   Bradley Plump, MD  amLODipine (NORVASC) 10 MG tablet TAKE 1 TABLET DAILY 06/07/14   Bradley Plump, MD  aspirin 81 MG  tablet Take 81 mg by mouth daily.    Historical Provider, MD  carvedilol (COREG) 25 MG tablet TAKE ONE AND ONE-HALF TABLETS TWICE A DAY WITH MEALS 08/09/14   Bradley Plump, MD  furosemide (LASIX) 40 MG tablet TAKE ONE AND ONE-HALF TABLETS (60 MG) TWICE A DAY    Bradley Plump, MD  JANUVIA 100 MG tablet TAKE 1 TABLET DAILY    Bradley Plump, MD  losartan (COZAAR) 100 MG tablet TAKE 1 TABLET DAILY 07/16/14   Bradley Plump, MD  Multiple Vitamins-Minerals (MULTIVITAMINS THER. W/MINERALS) TABS tablet Take 1 tablet by mouth daily. 09/14/13   Bradley Plump, MD  nitroGLYCERIN (NITROSTAT) 0.4 MG SL tablet Place 1 tablet (0.4 mg total) under the tongue every 5 (five) minutes as needed for chest pain. 07/10/12   Bradley Norman, MD  ONE TOUCH ULTRA TEST test strip USE AS DIRECTED 07/19/14   Bradley Plump, MD  pantoprazole (PROTONIX) 40 MG tablet TAKE 1 TABLET EVERY MORNING 04/08/14   Bradley Plump, MD  zolpidem (AMBIEN) 10 MG tablet Take 1 tablet (10 mg total) by mouth at bedtime as needed. For sleep 09/14/13   Bradley Plump, MD   Allergies  Allergen Reactions  . Hydrocodone Other (See Comments)    "given to him in the hospital; went thru withdrawals once home; dr said not to take it again" (09/27/2012)  . Tramadol Other (See Comments)    "given to him in the hospital; went thru withdrawals once home; dr said not to take it again" (09/27/2012)  . Tadalafil Other (See  Comments)    headache, backache  . Avelox [Moxifloxacin Hydrochloride] Itching and Other (See Comments)    Headache    FAMILY HISTORY:  indicated that his mother is deceased. He indicated that his father is deceased. He indicated that his sister is alive. He indicated that his brother is alive. He indicated that his son is alive.  SOCIAL HISTORY:  reports that he quit smoking about 33 years ago. His smoking use included Cigarettes. He has a 62.5 pack-year smoking history. He has never used smokeless tobacco. He reports that he drinks alcohol. He reports that he does not use  illicit drugs.  REVIEW OF SYSTEMS:   Bolds are positive  Constitutional: weight loss, gain, night sweats, Fevers, chills, fatigue .  HEENT: headaches, Sore throat, sneezing, nasal congestion, post nasal drip, Difficulty swallowing, Tooth/dental problems, visual complaints visual changes, ear ache CV:  chest pain, radiates: ,Orthopnea, PND, swelling in lower extremities, dizziness, palpitations, syncope.  GI  heartburn, indigestion, abdominal pain, nausea, vomiting, diarrhea, change in bowel habits, loss of appetite, bloody stools.  Resp: cough, non-productive: , hemoptysis, dyspnea, chest pain, pleuritic.  Skin: rash or itching or icterus GU: dysuria, change in color of urine, urgency or frequency. flank pain, hematuria  MS: joint pain or swelling. decreased range of motion  Psych: change in mood or affect. depression or anxiety.  Neuro: difficulty with speech, weakness, numbness, ataxia   SUBJECTIVE:   VITAL SIGNS: Temp:  [98.3 F (36.8 C)] 98.3 F (36.8 C) (11/12 1109) Pulse Rate:  [67-75] 75 (11/12 1331) BP: (122-138)/(68) 138/68 mmHg (11/12 1331) SpO2:  [86 %-97 %] 86 % (11/12 1331) Weight:  [96.616 kg (213 lb)] 96.616 kg (213 lb) (11/12 1109) HEMODYNAMICS:   VENTILATOR SETTINGS:   INTAKE / OUTPUT: No intake or output data in the 24 hours ending 08/22/14 1456  PHYSICAL EXAMINATION: General:  Overweight male in mild respiratory distress Neuro:  Alert, oriented, non-focal HEENT:  [name]/AT, PERRL, no JVD noted Cardiovascular:  RRR, no MRG Lungs:  Poor air movement. Wheeze throughout. Pursed lip breathing Abdomen:  Soft, non-tender, non-distended Musculoskeletal:  No acute deformity or ROM limitation Skin:  Intact  LABS:  CBC No results for input(s): WBC, HGB, HCT, PLT in the last 168 hours. Coag's No results for input(s): APTT, INR in the last 168 hours. BMET No results for input(s): NA, K, CL, CO2, BUN, CREATININE, GLUCOSE in the last 168 hours. Electrolytes No  results for input(s): CALCIUM, MG, PHOS in the last 168 hours. Sepsis Markers No results for input(s): LATICACIDVEN, PROCALCITON, O2SATVEN in the last 168 hours. ABG No results for input(s): PHART, PCO2ART, PO2ART in the last 168 hours. Liver Enzymes No results for input(s): AST, ALT, ALKPHOS, BILITOT, ALBUMIN in the last 168 hours. Cardiac Enzymes No results for input(s): TROPONINI, PROBNP in the last 168 hours. Glucose No results for input(s): GLUCAP in the last 168 hours.  Imaging No results found.   ASSESSMENT / PLAN:   Acute hypoxemic respiratory failure > ddx includes Acute exacerbation of COPD vs CHF; most likely diagnosis is AECOPD based on exam findings consistent with bronchospasm.  Known COPD, 2013 PFT FEV1 1.55L (55% pred) Pulmonary hypertension?   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 Will need initiation of inhalers prior to DC (does not currently take any) Nebulized budesonide Solumedrol 80mg  BID IV Levaquin per pharmacy 2D echo  Will need pulmonary follow up at discharge Consider CT chest if symptoms do not improve   Joneen RoachPaul Hoffman, ACNP [facility] Pager [phone] or [phone]  COPD history, patient likely still recovering from acute bronchitis.  Plan to start steroids, abx and nebulizer treatment as above.  Patient seen and examined, agree with above note.  I dictated the care and orders written for this patient under my direction.  Alyson ReedyWesam G Janeshia Ciliberto, MD [phone]

## 2014-08-23 DIAGNOSIS — J441 Chronic obstructive pulmonary disease with (acute) exacerbation: Secondary | ICD-10-CM

## 2014-08-23 DIAGNOSIS — I369 Nonrheumatic tricuspid valve disorder, unspecified: Secondary | ICD-10-CM

## 2014-08-23 LAB — BASIC METABOLIC PANEL
Anion gap: 11 (ref 5–15)
BUN: 16 mg/dL (ref 6–23)
CO2: 28 mEq/L (ref 19–32)
Calcium: 9.3 mg/dL (ref 8.4–10.5)
Chloride: 98 mEq/L (ref 96–112)
Creatinine, Ser: 0.81 mg/dL (ref 0.50–1.35)
GFR calc Af Amer: >90 mL/min (ref >90)
GFR calc non Af Amer: 86 mL/min — ABNORMAL LOW (ref >90)
Glucose, Bld: 185 mg/dL — ABNORMAL HIGH (ref 70–99)
Potassium: 4.7 mEq/L (ref 3.7–5.3)
Sodium: 137 mEq/L (ref 137–147)

## 2014-08-23 LAB — CBC
HCT: 40.5 % (ref 39.0–52.0)
Hemoglobin: 14.1 g/dL (ref 13.0–17.0)
MCH: 31.1 pg (ref 26.0–34.0)
MCHC: 34.8 g/dL (ref 30.0–36.0)
MCV: 89.4 fL (ref 78.0–100.0)
Platelets: 175 10*3/uL (ref 150–400)
RBC: 4.53 MIL/uL (ref 4.22–5.81)
RDW: 14.5 % (ref 11.5–15.5)
WBC: 10.2 10*3/uL (ref 4.0–10.5)

## 2014-08-23 LAB — GLUCOSE, CAPILLARY
Glucose-Capillary: 202 mg/dL — ABNORMAL HIGH (ref 70–99)
Glucose-Capillary: 224 mg/dL — ABNORMAL HIGH (ref 70–99)
Glucose-Capillary: 206 mg/dL — ABNORMAL HIGH (ref 70–99)
Glucose-Capillary: 162 mg/dL — ABNORMAL HIGH (ref 70–99)

## 2014-08-23 MED ORDER — THERA M PLUS PO TABS: 1.0000 | ORAL_TABLET | Freq: Every day | ORAL | Status: DC

## 2014-08-23 MED ORDER — INSULIN ASPART 100 UNIT/ML ~~LOC~~ SOLN: 0.0000 [IU] | Freq: Every day | SUBCUTANEOUS | Status: DC

## 2014-08-23 MED ORDER — METHYLPREDNISOLONE SODIUM SUCC 40 MG IJ SOLR
40.0000 mg | Freq: Two times a day (BID) | INTRAMUSCULAR | Status: DC
  Administered 2014-08-23 – 2014-08-24 (×2): 40 mg via INTRAVENOUS
  Filled 2014-08-23 (×4): qty 1

## 2014-08-23 MED ORDER — LEVOFLOXACIN 500 MG PO TABS
500.0000 mg | ORAL_TABLET | Freq: Every day | ORAL | Status: DC
  Administered 2014-08-23 – 2014-08-25 (×3): 500 mg via ORAL
  Filled 2014-08-23 (×3): qty 1

## 2014-08-23 MED ORDER — IPRATROPIUM-ALBUTEROL 0.5-2.5 (3) MG/3ML IN SOLN
3.0000 mL | Freq: Four times a day (QID) | RESPIRATORY_TRACT | Status: DC
  Administered 2014-08-23 – 2014-08-25 (×7): 3 mL via RESPIRATORY_TRACT
  Filled 2014-08-23 (×7): qty 3

## 2014-08-23 MED ORDER — ADULT MULTIVITAMIN W/MINERALS CH
1.0000 | ORAL_TABLET | Freq: Every day | ORAL | Status: DC
  Administered 2014-08-24 – 2014-08-25 (×2): 1 via ORAL
  Filled 2014-08-23 (×2): qty 1

## 2014-08-23 MED ORDER — IPRATROPIUM-ALBUTEROL 0.5-2.5 (3) MG/3ML IN SOLN
3.0000 mL | Freq: Four times a day (QID) | RESPIRATORY_TRACT | Status: DC
  Administered 2014-08-23 (×2): 3 mL via RESPIRATORY_TRACT
  Filled 2014-08-23 (×2): qty 3

## 2014-08-23 MED ORDER — INSULIN ASPART 100 UNIT/ML ~~LOC~~ SOLN
0.0000 [IU] | Freq: Three times a day (TID) | SUBCUTANEOUS | Status: DC
  Administered 2014-08-23: 7 [IU] via SUBCUTANEOUS
  Administered 2014-08-24: 11 [IU] via SUBCUTANEOUS
  Administered 2014-08-24 (×2): 7 [IU] via SUBCUTANEOUS
  Administered 2014-08-25: 4 [IU] via SUBCUTANEOUS
  Administered 2014-08-25: 11 [IU] via SUBCUTANEOUS

## 2014-08-23 NOTE — Progress Notes (Signed)
[place] TEAM 1 - Stepdown/ICU TEAM Progress Note  Bradley SearlesJohn J [name] ZOX:[id number] DOB: 10/11/1940 DOA: 08/22/2014 PCP: Bradley OraJose Paz, MD  Admit HPI / Brief Narrative: 73 y.o. male with a hx of HTN, HLD, and systolic and diastolic chf who presented from outpatient clinic after failing tx for bronhhitis with doxycycline. Pt reportedly progressively became more dyspneic and sob. A cxr was found to be unremarkable without evidence of PNA or pulm edema. He was noted to have O2 sats in the mid-80's on O2. The patient was then directly admitted to stepdown.   HPI/Subjective: Pt is feeling much better overall.  He denies current chest pain. He is mildly sob at rest.    Assessment/Plan:  Acute on chronic resp failure with hypoxia most likely diagnosis is AECOPD - cont usual tx and follow   Hx of COPD  Has not smoked in 30 years, but was a "very heavy" smoker  Chronic combined diastolic and systolic CHF  EF 81-19%45-50% via TTE Sept 2013 - repeat pending to determine if CHF playing a role   Hx afib s/p ablation w/ pacer Nov 2012  CAD Asymptomatic   HTN BP well controlled at present   HLD  DM2 CBG uncontrolled in setting of steroid use - adjust tx and follow   Code Status: FULL Family Communication: spoke w/ wife and sister at bedside  Disposition Plan: SDU - possible d/c home in AM therefore will not transfer pt   Consultants: Pulmonary   Procedures: none  Antibiotics: Levaquin 11/12 >  DVT prophylaxis: SQ heparin   Objective: Blood pressure 119/53, pulse 72, temperature 97.6 F (36.4 C), temperature source Oral, resp. rate 23, height 5\' 9"  (1.753 m), weight 93.3 kg (205 lb 11 oz), SpO2 95 %.  Intake/Output Summary (Last 24 hours) at 08/23/14 1511 Last data filed at 08/23/14 1035  Gross per 24 hour  Intake    346 ml  Output   1825 ml  Net  -1479 ml   Exam: General: using pursed lip breathing - able to complete full sentences  Lungs: poor air movement th/o but no wheeze and  no crackles  Cardiovascular: Regular rate and rhythm without murmur gallop or rub  Abdomen: Nontender, nondistended, soft, bowel sounds positive, no rebound, no ascites, no appreciable mass Extremities: No significant cyanosis, clubbing, or edema bilateral lower extremities  Data Reviewed: Basic Metabolic Panel:  Recent Labs Lab 08/22/14 1900 08/23/14 0320  NA 137 137  K 4.3 4.7  CL 95* 98  CO2 29 28  GLUCOSE 166* 185*  BUN 15 16  CREATININE 0.89 0.81  CALCIUM 9.3 9.3    Liver Function Tests:  Recent Labs Lab 08/22/14 1900  AST 21  ALT 15  ALKPHOS 73  BILITOT 0.9  PROT 7.4  ALBUMIN 3.7   CBC:  Recent Labs Lab 08/22/14 1900 08/23/14 0320  WBC 17.2* 10.2  NEUTROABS 14.8*  --   HGB 14.4 14.1  HCT 41.5 40.5  MCV 88.3 89.4  PLT 208 175    CBG:  Recent Labs Lab 08/22/14 1752 08/22/14 2236 08/23/14 0901 08/23/14 1219  GLUCAP 136* 199* 202* 224*    Recent Results (from the past 240 hour(s))  MRSA PCR Screening     Status: None   Collection Time: 08/22/14  4:27 PM  Result Value Ref Range Status   MRSA by PCR NEGATIVE NEGATIVE Final    Comment:        The GeneXpert MRSA Assay (FDA approved for NASAL specimens only),  is one component of a comprehensive MRSA colonization surveillance program. It is not intended to diagnose MRSA infection nor to guide or monitor treatment for MRSA infections.      Studies:  Recent x-ray studies have been reviewed in detail by the Attending Physician  Scheduled Meds:  Scheduled Meds: . amLODipine  10 mg Oral Daily  . aspirin EC  81 mg Oral Daily  . budesonide  0.25 mg Nebulization 4 times per day  . carvedilol  25 mg Oral BID WC  . furosemide  60 mg Oral BID  . heparin  5,000 Units Subcutaneous 3 times per day  . insulin aspart  0-15 Units Subcutaneous TID WC  . insulin aspart  0-5 Units Subcutaneous QHS  . ipratropium-albuterol  3 mL Nebulization Q6H  . levofloxacin  500 mg Oral Daily  . linagliptin  5  mg Oral Daily  . losartan  100 mg Oral Daily  . methylPREDNISolone (SOLU-MEDROL) injection  40 mg Intravenous Q12H  . pantoprazole  40 mg Oral Daily  . sodium chloride  3 mL Intravenous Q12H    Time spent on care of this patient: 35 mins   MCCLUNG,JEFFREY T , MD   [facility] Office  [phone] Pager - Text Page per Loretha Stapler as per below:  On-Call/Text Page:      [email or web address]      password [email or web address]  If 7PM-7AM, please contact night-coverage [email or web address] Password [email or web address] 08/23/2014, 3:11 PM   LOS: 1 day

## 2014-08-23 NOTE — Plan of Care (Signed)
Problem: ICU Phase Progression Outcomes Goal: Hemodynamically stable Outcome: Completed/Met Date Met:  08/23/14 Goal: Pain controlled with appropriate interventions Outcome: Not Applicable Date Met:  61/51/83 Goal: Flu/PneumoVaccines if indicated Outcome: Completed/Met Date Met:  08/23/14 Goal: Initial discharge plan identified Outcome: Progressing Goal: Voiding-avoid urinary catheter unless indicated Outcome: Completed/Met Date Met:  08/23/14

## 2014-08-23 NOTE — Progress Notes (Signed)
No new complaints Still with moderate exertional dyspnea   Filed Vitals:   08/23/14 0941 08/23/14 1000 08/23/14 1030 08/23/14 1100  BP: 126/70 114/53    Pulse:  71 70 72  Temp:      TempSrc:      Resp:  24 19 23   Height:      Weight:      SpO2:  96% 100% 96%   Mildly dyspneic appearing after ambulating to rest room HEENT WNL No JVD BS diminished throughout, no wheezes RRR NABS Chronic stasis changes, no edema  I have reviewed all of today's lab results. 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 Please call if we can be of further assistance  Billy Fischer, MD ; [facility] [phone].  After 5:30 PM or weekends, call [phone]

## 2014-08-23 NOTE — Progress Notes (Signed)
  Echocardiogram 2D Echocardiogram has been performed.  Georgian Co 08/23/2014, 10:47 AM

## 2014-08-23 NOTE — Plan of Care (Signed)
Problem: ICU Phase Progression Outcomes Goal: O2 sats trending toward baseline Outcome: Progressing Goal: Dyspnea controlled at rest Outcome: Completed/Met Date Met:  08/23/14

## 2014-08-23 NOTE — Progress Notes (Signed)
Utilization review completed. Nancie Bocanegra, RN, BSN. 

## 2014-08-24 LAB — GLUCOSE, CAPILLARY
Glucose-Capillary: 214 mg/dL — ABNORMAL HIGH (ref 70–99)
Glucose-Capillary: 286 mg/dL — ABNORMAL HIGH (ref 70–99)
Glucose-Capillary: 234 mg/dL — ABNORMAL HIGH (ref 70–99)
Glucose-Capillary: 150 mg/dL — ABNORMAL HIGH (ref 70–99)

## 2014-08-24 LAB — TROPONIN I
Troponin I: <0.3 ng/mL (ref <0.30)
Troponin I: <0.3 ng/mL (ref <0.30)

## 2014-08-24 MED ORDER — NIACIN ER 500 MG PO CPCR
1000.0000 mg | ORAL_CAPSULE | Freq: Every day | ORAL | Status: DC
  Administered 2014-08-24: 1000 mg via ORAL
  Filled 2014-08-24 (×2): qty 2

## 2014-08-24 MED ORDER — PRAVASTATIN SODIUM 20 MG PO TABS
20.0000 mg | ORAL_TABLET | Freq: Every day | ORAL | Status: DC
  Administered 2014-08-24: 20 mg via ORAL
  Filled 2014-08-24 (×2): qty 1

## 2014-08-24 MED ORDER — INSULIN ASPART 100 UNIT/ML ~~LOC~~ SOLN
4.0000 [IU] | Freq: Three times a day (TID) | SUBCUTANEOUS | Status: DC
  Administered 2014-08-24 – 2014-08-25 (×3): 4 [IU] via SUBCUTANEOUS

## 2014-08-24 MED ORDER — NIACIN-LOVASTATIN ER 1000-20 MG PO TB24: 1.0000 | ORAL_TABLET | Freq: Every day | ORAL | Status: DC

## 2014-08-24 MED ORDER — PREDNISONE 20 MG PO TABS
40.0000 mg | ORAL_TABLET | Freq: Every day | ORAL | Status: DC
Start: 2014-08-25 — End: 2014-08-25
  Administered 2014-08-25: 40 mg via ORAL
  Filled 2014-08-24 (×2): qty 2

## 2014-08-24 NOTE — Progress Notes (Addendum)
TEAM 1 - Stepdown/ICU TEAM Progress Note  Bradley SearlesJohn J [name] ZOX:[id number] DOB: 03/18/1941 DOA: 08/22/2014 PCP: Willow OraJose Paz, MD  Admit HPI / Brief Narrative: 73 y.o. male with a hx of HTN, HLD, and systolic and diastolic chf who presented from outpatient clinic after failing tx for bronhhitis with doxycycline. Pt reportedly progressively became more dyspneic and sob. A cxr was found to be unremarkable without evidence of PNA or pulm edema. He was noted to have O2 sats in the mid-80's on O2. The patient was then directly admitted to stepdown.   HPI/Subjective: Pt reports the onset of substernal chest pressure this morning while arguing w/ his homeless son via telephone.  He denies similar complaints w/ simple exertion, but admits that he has had this pain during previous arguments/stressful situations.  He states that his breathing is "almost back to normal."  Assessment/Plan:  Acute on chronic resp failure with hypoxia most likely diagnosis is AECOPD - taper steroids to oral pred - wean O2 to home dose of 2L - follow   SSCP Likely stress related, but pt is at high risk for CAD - check EKG - cycle enzymes   Hx of COPD  Has not smoked in 30 years, but was a "very heavy" smoker - on 2LPM O2 at all times at home   Chronic combined diastolic and systolic CHF  EF 81-19%45-50% via TTE Sept 2013 - repeat TTE notes improvement in EF - no clinical signs of CHF exac  Hx afib s/p ablation w/ pacer Nov 2012 Followed by [facility]  HTN BP well controlled at present   HLD Resume advicor   DM2 CBG uncontrolled in setting of steroid use - adjust tx again and follow - taper steroids  Code Status: FULL Family Communication: no family present at time of exam  Disposition Plan: SDU   Consultants: Pulmonary   Procedures: TTE - 11/13 - EF 50-55% - severely dilated LA - severely dilated RA - PA pressure 50mm Hg  Antibiotics: Levaquin 11/12 >  DVT prophylaxis: SQ heparin   Objective: Blood  pressure 107/65, pulse 73, temperature 97.6 F (36.4 C), temperature source Oral, resp. rate 21, height 5\' 9"  (1.753 m), weight 94.2 kg (207 lb 10.8 oz), SpO2 98 %.  Intake/Output Summary (Last 24 hours) at 08/24/14 1321 Last data filed at 08/24/14 0808  Gross per 24 hour  Intake    780 ml  Output   1475 ml  Net   -695 ml   Exam: General: pursed lip breathing continues - able to complete full sentences  Lungs: poor air movement th/o but no wheeze and no crackles  Cardiovascular: Regular rate and rhythm without murmur gallop or rub  Abdomen: Nontender, nondistended, soft, bowel sounds positive, no rebound, no ascites Extremities: No significant cyanosis, clubbing, edema bilateral lower extremities  Data Reviewed: Basic Metabolic Panel:  Recent Labs Lab 08/22/14 1900 08/23/14 0320  NA 137 137  K 4.3 4.7  CL 95* 98  CO2 29 28  GLUCOSE 166* 185*  BUN 15 16  CREATININE 0.89 0.81  CALCIUM 9.3 9.3    Liver Function Tests:  Recent Labs Lab 08/22/14 1900  AST 21  ALT 15  ALKPHOS 73  BILITOT 0.9  PROT 7.4  ALBUMIN 3.7   CBC:  Recent Labs Lab 08/22/14 1900 08/23/14 0320  WBC 17.2* 10.2  NEUTROABS 14.8*  --   HGB 14.4 14.1  HCT 41.5 40.5  MCV 88.3 89.4  PLT 208 175  CBG:  Recent Labs Lab 08/23/14 1219 08/23/14 1622 08/23/14 2229 08/24/14 0801 08/24/14 1204  GLUCAP 224* 206* 162* 214* 286*    Recent Results (from the past 240 hour(s))  MRSA PCR Screening     Status: None   Collection Time: 08/22/14  4:27 PM  Result Value Ref Range Status   MRSA by PCR NEGATIVE NEGATIVE Final    Comment:        The GeneXpert MRSA Assay (FDA approved for NASAL specimens only), is one component of a comprehensive MRSA colonization surveillance program. It is not intended to diagnose MRSA infection nor to guide or monitor treatment for MRSA infections.      Studies:  Recent x-ray studies have been reviewed in detail by the Attending Physician  Scheduled  Meds:  Scheduled Meds: . amLODipine  10 mg Oral Daily  . aspirin EC  81 mg Oral Daily  . budesonide  0.25 mg Nebulization 4 times per day  . carvedilol  25 mg Oral BID WC  . furosemide  60 mg Oral BID  . heparin  5,000 Units Subcutaneous 3 times per day  . insulin aspart  0-20 Units Subcutaneous TID WC  . insulin aspart  0-5 Units Subcutaneous QHS  . ipratropium-albuterol  3 mL Nebulization QID  . levofloxacin  500 mg Oral Daily  . linagliptin  5 mg Oral Daily  . losartan  100 mg Oral Daily  . methylPREDNISolone (SOLU-MEDROL) injection  40 mg Intravenous Q12H  . multivitamin with minerals  1 tablet Oral Daily  . pantoprazole  40 mg Oral Daily  . sodium chloride  3 mL Intravenous Q12H    Time spent on care of this patient: 35 mins   MCCLUNG,JEFFREY T , MD   [facility] Office  [phone] Pager - Text Page per Loretha Stapler as per below:  On-Call/Text Page:      [email or web address]      password [email or web address]  If 7PM-7AM, please contact night-coverage [email or web address] Password [email or web address] 08/24/2014, 1:21 PM   LOS: 2 days

## 2014-08-25 LAB — BASIC METABOLIC PANEL
Anion gap: 14 (ref 5–15)
BUN: 29 mg/dL — ABNORMAL HIGH (ref 6–23)
CO2: 24 mEq/L (ref 19–32)
Calcium: 8.9 mg/dL (ref 8.4–10.5)
Chloride: 93 mEq/L — ABNORMAL LOW (ref 96–112)
Creatinine, Ser: 0.92 mg/dL (ref 0.50–1.35)
GFR calc Af Amer: >90 mL/min (ref >90)
GFR calc non Af Amer: 82 mL/min — ABNORMAL LOW (ref >90)
Glucose, Bld: 186 mg/dL — ABNORMAL HIGH (ref 70–99)
Potassium: 4.7 mEq/L (ref 3.7–5.3)
Sodium: 131 mEq/L — ABNORMAL LOW (ref 137–147)

## 2014-08-25 LAB — GLUCOSE, CAPILLARY
Glucose-Capillary: 252 mg/dL — ABNORMAL HIGH (ref 70–99)
Glucose-Capillary: 182 mg/dL — ABNORMAL HIGH (ref 70–99)

## 2014-08-25 LAB — TROPONIN I: Troponin I: <0.3 ng/mL (ref <0.30)

## 2014-08-25 MED ORDER — PREDNISONE 10 MG PO TABS: ORAL_TABLET | ORAL | Status: DC

## 2014-08-25 MED ORDER — TIOTROPIUM BROMIDE MONOHYDRATE 18 MCG IN CAPS
18.0000 ug | ORAL_CAPSULE | Freq: Every day | RESPIRATORY_TRACT | Status: DC
  Filled 2014-08-25: qty 5

## 2014-08-25 MED ORDER — ALBUTEROL SULFATE HFA 108 (90 BASE) MCG/ACT IN AERS: 2.0000 | INHALATION_SPRAY | RESPIRATORY_TRACT | Status: DC | PRN

## 2014-08-25 MED ORDER — LEVOFLOXACIN 500 MG PO TABS: 500.0000 mg | ORAL_TABLET | Freq: Every day | ORAL | Status: DC

## 2014-08-25 MED ORDER — TIOTROPIUM BROMIDE MONOHYDRATE 18 MCG IN CAPS: 18.0000 ug | ORAL_CAPSULE | Freq: Every day | RESPIRATORY_TRACT | Status: DC

## 2014-08-25 MED ORDER — ALBUTEROL SULFATE (2.5 MG/3ML) 0.083% IN NEBU: 3.0000 mL | INHALATION_SOLUTION | RESPIRATORY_TRACT | Status: DC | PRN

## 2014-08-25 NOTE — Discharge Instructions (Signed)

## 2014-08-25 NOTE — Progress Notes (Signed)
ANTIBIOTIC CONSULT NOTE  Pharmacy Consult:  Levaquin Indication:  COPD exacerbation   Allergies  Allergen Reactions  . Hydrocodone Other (See Comments)    "given to him in the hospital; went thru withdrawals once home; dr said not to take it again" (09/27/2012)  . Tramadol Other (See Comments)    "given to him in the hospital; went thru withdrawals once home; dr said not to take it again" (09/27/2012)  . Tadalafil Other (See Comments)    headache, backache  . Avelox [Moxifloxacin Hydrochloride] Itching and Other (See Comments)    Headache    Patient Measurements: Height: 5\' 9"  (175.3 cm) Weight: 211 lb 6.7 oz (95.9 kg) IBW/kg (Calculated) : 70.7  Vital Signs: Temp: 97.4 F (36.3 C) (11/15 1127) Temp Source: Oral (11/15 1127) BP: 127/84 mmHg (11/15 1127) Pulse Rate: 74 (11/15 1127) Intake/Output from this shift: Total I/O In: 3 [I.V.:3] Out: 400 [Urine:400]  Labs:  Recent Labs  08/22/14 1900 08/23/14 0320 08/25/14 0107  WBC 17.2* 10.2  --   HGB 14.4 14.1  --   PLT 208 175  --   CREATININE 0.89 0.81 0.92   Estimated Creatinine Clearance: 81.7 mL/min (by C-G formula based on Cr of 0.92). No results for input(s): VANCOTROUGH, VANCOPEAK, VANCORANDOM, GENTTROUGH, GENTPEAK, GENTRANDOM, TOBRATROUGH, TOBRAPEAK, TOBRARND, AMIKACINPEAK, AMIKACINTROU, AMIKACIN in the last 72 hours.   Microbiology: Recent Results (from the past 720 hour(s))  MRSA PCR Screening     Status: None   Collection Time: 08/22/14  4:27 PM  Result Value Ref Range Status   MRSA by PCR NEGATIVE NEGATIVE Final    Comment:        The GeneXpert MRSA Assay (FDA approved for NASAL specimens only), is one component of a comprehensive MRSA colonization surveillance program. It is not intended to diagnose MRSA infection nor to guide or monitor treatment for MRSA infections.     Medical History: Past Medical History  Diagnosis Date  . HTN (hypertension)   . HLD (hyperlipidemia)   . BPH (benign  prostatic hyperplasia)     Saw Dr [name] 2004, normal renal u/s  . Pleural effusion 2008    S/p decortication  . Insomnia   . Atrial fibrillation     s/p AV node ablation & BiV PPM implantation 09/08/11 (op dictation pending)  . Pulmonary HTN     per cath 2008  . Peripheral vascular disease     ??  . Anemia     intermittent  . Pacemaker   . Heart murmur   . Type II diabetes mellitus 1999  . On home oxygen therapy     "sleep w/2L q hs & during day prn" (08/22/2014)  . 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 CAD (coronary artery disease)   . Abnormal CT scan, chest 2012    CT chest, several lymphadenopathies. Sees pulmonary  . CHF (congestive heart failure)     Thought primarily to be non-systolic although EF down (EF 57-97% 12/2010, down to 35-40% 09/05/11), cath 2008 with no CAD, nuclear study 07/2011 showing Small area of reversibility in the distal ant/lat wall the left ventricle suspicious for ischemia/septal wall HK but felt to be low risk  (per D/C Summary 07/2011)  . Chronic bronchitis     "get it ~ q yr"  . Migraines     "very very rare"  . Arthritis     "?back" (08/22/2014)     Assessment: 84 YOM s/p recent treatment of bronchitis  presented with complaint of SOB.  Pharmacy consulted to dose Levaquin for COPD exacerbation.  Now on day 4/7. Patient remains afebrile, WBC have trended down to wnl, and SCr remains stable at 0.92 (CrCl ~81 ml/min).    Goal of Therapy:  Resolution of COPD exac   Plan:  - LVQ 500mg  PO Q24H with end date 11/18 entered - Monitor renal fxn, clinical progress - Pharmacy will sign off, please re-consult if needed  Margie BilletErika K. von Vajna, PharmD Clinical Pharmacist - Resident Pager: [phone] Pharmacy: [phone] 08/25/2014 11:53 AM

## 2014-08-25 NOTE — Progress Notes (Signed)
Patient discharge teaching performed.  Patient acknowledged understanding verbally.  Prescriptions given to patient and discharge papers signed.  PIV removed from right hand.  Telemetry monitors removed.  Patient is stable and able to discharge home at this time.

## 2014-08-25 NOTE — Discharge Summary (Signed)
DISCHARGE SUMMARY  Bradley [name]  MR#: [id number]  DOB:07/21/1941  Date of Admission: 08/22/2014 Date of Discharge: 08/25/2014  Attending Physician:Bradley [name]  Patient's EXB:MWUXPCP:Bradley [name], MD  Consults:  PCCM  Disposition: D/C home   Follow-up Appts:     Follow-up Information    Follow up with Willow OraJose Paz, MD. Schedule an appointment as soon as possible for a visit in 1 week.   Specialty:  Internal Medicine   Contact information:   [address] [place] [id number] [phone]       [facility] On 09/06/2014.   Why:  11:15AM    Contact information:   [address] [place] [phone]      Discharge Diagnoses: Acute on chronic resp failure with hypoxia SSCP Hx of COPD  Chronic combined diastolic and systolic CHF  Hx afib s/p ablation w/ pacer Nov 2012 HTN HLD DM2  Initial presentation: 73 y.o. male with a hx of HTN, HLD, and systolic and diastolic chf who presented from outpatient clinic after failing tx for bronhhitis with doxycycline. Pt reportedly progressively became more dyspneic and sob. A cxr was found to be without evidence of PNA or pulm edema. He was noted to have O2 sats in the mid-80's on O2. The patient was then directly admitted to stepdown.   Hospital Course:  Acute on chronic resp failure with hypoxia Felt to be due to AECOPD - tapered steroids to oral pred from IV solumedrol - weaned O2 to home dose of 2L w/ sats 90% or > - cleared for d/c home w/ completion of steroid taper and oral abx tx - spiriva and albuterol per PCCM recs  SSCP Likely situational stress related (family argument), but pt high risk for CAD - EKGs unrevealing - cycled enzymes all negative - sx resolved at time of d/c   Hx of COPD  Has not smoked in 30 years, but was a "very heavy" smoker - on 2LPM O2 at all times at home   Chronic combined diastolic and systolic CHF  EF 56-38%45-50% via TTE Sept 2013 -  repeat TTE notes improvement in EF - no clinical signs of CHF exac  Hx afib s/p ablation w/ pacer Nov 2012 [facility]ed by [facility]  HTN BP well controlled at present   HLD Resume advicor   DM2 CBG uncontrolled in setting of steroid use - taper steroids quickly and resume usual tx at home post d/c      Medication List    TAKE these medications        ADVICOR 1000-20 MG 24 hr tablet  Generic drug:  niacin-lovastatin  TAKE 1 TABLET AT BEDTIME     albuterol 108 (90 BASE) MCG/ACT inhaler  Commonly known as:  PROVENTIL HFA;VENTOLIN HFA  Inhale 2 puffs into the lungs every 4 (four) hours as needed for wheezing or shortness of breath.     amLODipine 10 MG tablet  Commonly known as:  NORVASC  TAKE 1 TABLET DAILY     aspirin 81 MG tablet  Take 81 mg by mouth daily.     carvedilol 25 MG tablet  Commonly known as:  COREG  TAKE ONE AND ONE-HALF TABLETS TWICE A DAY WITH MEALS     furosemide 40 MG tablet  Commonly known as:  LASIX  TAKE ONE AND ONE-HALF TABLETS (60 MG) TWICE A DAY     JANUVIA 100 MG tablet  Generic drug:  sitaGLIPtin  TAKE  1 TABLET DAILY     levofloxacin 500 MG tablet  Commonly known as:  LEVAQUIN  Take 1 tablet (500 mg total) by mouth daily.  Start taking on:  08/26/2014     losartan 100 MG tablet  Commonly known as:  COZAAR  TAKE 1 TABLET DAILY     multivitamins ther. w/minerals Tabs tablet  Take 1 tablet by mouth daily.     nitroGLYCERIN 0.4 MG SL tablet  Commonly known as:  NITROSTAT  Place 1 tablet (0.4 mg total) under the tongue every 5 (five) minutes as needed for chest pain.     ONE TOUCH ULTRA TEST test strip  Generic drug:  glucose blood  USE AS DIRECTED     pantoprazole 40 MG tablet  Commonly known as:  PROTONIX  TAKE 1 TABLET EVERY MORNING     predniSONE 10 MG tablet  Commonly known as:  DELTASONE  4 tablets a day on 11/16, then 3 tablets a day on 11/17 and 11/18, then 2 tablets a day on 11/19 and 11/20, then 1 tablet a day on  11/21 and 11/22, then stop     tiotropium 18 MCG inhalation capsule  Commonly known as:  SPIRIVA  Place 1 capsule (18 mcg total) into inhaler and inhale daily.     zolpidem 10 MG tablet  Commonly known as:  AMBIEN  Take 1 tablet (10 mg total) by mouth at bedtime as needed. For sleep        Day of Discharge BP 127/84 mmHg  Pulse 74  Temp(Src) 97.4 F (36.3 C) (Oral)  Resp 17  Ht 5\' 9"  (1.753 m)  Wt 95.9 kg (211 lb 6.7 oz)  BMI 31.21 kg/m2  SpO2 98%  Physical Exam: General: No acute respiratory distress - uses pused lip breathing as per baseline Lungs: Clear to auscultation bilaterally without wheezes or crackles - poor air movement in all fields Cardiovascular: Regular rate and rhythm without murmur gallop or rub  Abdomen: Nontender, nondistended, soft, bowel sounds positive, no rebound, no ascites, no appreciable mass Extremities: No significant cyanosis, clubbing, or edema bilateral lower extremities  Basic Metabolic Panel:  Recent Labs Lab 08/22/14 1900 08/23/14 0320 08/25/14 0107  NA 137 137 131*  K 4.3 4.7 4.7  CL 95* 98 93*  CO2 29 28 24   GLUCOSE 166* 185* 186*  BUN 15 16 29*  CREATININE 0.89 0.81 0.92  CALCIUM 9.3 9.3 8.9    Liver Function Tests:  Recent Labs Lab 08/22/14 1900  AST 21  ALT 15  ALKPHOS 73  BILITOT 0.9  PROT 7.4  ALBUMIN 3.7   CBC:  Recent Labs Lab 08/22/14 1900 08/23/14 0320  WBC 17.2* 10.2  NEUTROABS 14.8*  --   HGB 14.4 14.1  HCT 41.5 40.5  MCV 88.3 89.4  PLT 208 175    Cardiac Enzymes:  Recent Labs Lab 08/24/14 1425 08/24/14 1910 08/25/14 0107  TROPONINI <0.30 <0.30 <0.30   CBG:  Recent Labs Lab 08/24/14 1204 08/24/14 1555 08/24/14 2142 08/25/14 0921 08/25/14 1125  GLUCAP 286* 234* 150* 252* 182*    Recent Results (from the past 240 hour(s))  MRSA PCR Screening     Status: None   Collection Time: 08/22/14  4:27 PM  Result Value Ref Range Status   MRSA by PCR NEGATIVE NEGATIVE Final    Comment:         The GeneXpert MRSA Assay (FDA approved for NASAL specimens only), is one component of a comprehensive MRSA colonization  surveillance program. It is not intended to diagnose MRSA infection nor to guide or monitor treatment for MRSA infections.       Time spent in discharge (includes decision making & examination of pt): >35 minutes  08/25/2014, 1:49 PM   Lonia Blood, MD [facility] Office  [phone] Pager [phone]  On-Call/Text Page:      [email or web address]      password [email or web address]

## 2014-08-27 ENCOUNTER — Encounter (HOSPITAL_BASED_OUTPATIENT_CLINIC_OR_DEPARTMENT_OTHER): Payer: Self-pay | Admitting: Emergency Medicine

## 2014-08-27 ENCOUNTER — Emergency Department (HOSPITAL_BASED_OUTPATIENT_CLINIC_OR_DEPARTMENT_OTHER)
Admission: EM | Admit: 2014-08-27 | Discharge: 2014-08-27 | Disposition: A | Discharge status: Home / Self Care | Payer: Medicare Other | Attending: Emergency Medicine | Admitting: Emergency Medicine

## 2014-08-27 ENCOUNTER — Telehealth: Payer: Self-pay

## 2014-08-27 DIAGNOSIS — R040 Epistaxis: Secondary | ICD-10-CM | POA: Insufficient documentation

## 2014-08-27 DIAGNOSIS — E119 Type 2 diabetes mellitus without complications: Secondary | ICD-10-CM | POA: Insufficient documentation

## 2014-08-27 DIAGNOSIS — G4700 Insomnia, unspecified: Secondary | ICD-10-CM | POA: Diagnosis not present

## 2014-08-27 DIAGNOSIS — Z87891 Personal history of nicotine dependence: Secondary | ICD-10-CM | POA: Insufficient documentation

## 2014-08-27 DIAGNOSIS — Z862 Personal history of diseases of the blood and blood-forming organs and certain disorders involving the immune mechanism: Secondary | ICD-10-CM | POA: Diagnosis not present

## 2014-08-27 DIAGNOSIS — R011 Cardiac murmur, unspecified: Secondary | ICD-10-CM | POA: Diagnosis not present

## 2014-08-27 DIAGNOSIS — E785 Hyperlipidemia, unspecified: Secondary | ICD-10-CM | POA: Diagnosis not present

## 2014-08-27 DIAGNOSIS — Z9981 Dependence on supplemental oxygen: Secondary | ICD-10-CM | POA: Diagnosis not present

## 2014-08-27 DIAGNOSIS — Z7982 Long term (current) use of aspirin: Secondary | ICD-10-CM | POA: Insufficient documentation

## 2014-08-27 DIAGNOSIS — I4891 Unspecified atrial fibrillation: Secondary | ICD-10-CM | POA: Diagnosis not present

## 2014-08-27 DIAGNOSIS — I2510 Atherosclerotic heart disease of native coronary artery without angina pectoris: Secondary | ICD-10-CM | POA: Insufficient documentation

## 2014-08-27 DIAGNOSIS — Z79899 Other long term (current) drug therapy: Secondary | ICD-10-CM | POA: Insufficient documentation

## 2014-08-27 DIAGNOSIS — Z950 Presence of cardiac pacemaker: Secondary | ICD-10-CM | POA: Insufficient documentation

## 2014-08-27 DIAGNOSIS — I509 Heart failure, unspecified: Secondary | ICD-10-CM | POA: Insufficient documentation

## 2014-08-27 DIAGNOSIS — Z8709 Personal history of other diseases of the respiratory system: Secondary | ICD-10-CM | POA: Insufficient documentation

## 2014-08-27 DIAGNOSIS — G43909 Migraine, unspecified, not intractable, without status migrainosus: Secondary | ICD-10-CM | POA: Insufficient documentation

## 2014-08-27 DIAGNOSIS — M1990 Unspecified osteoarthritis, unspecified site: Secondary | ICD-10-CM | POA: Diagnosis not present

## 2014-08-27 DIAGNOSIS — I10 Essential (primary) hypertension: Secondary | ICD-10-CM | POA: Diagnosis not present

## 2014-08-27 MED ORDER — OXYMETAZOLINE HCL 0.05 % NA SOLN
NASAL | Status: AC
  Filled 2014-08-27: qty 15

## 2014-08-27 NOTE — Discharge Instructions (Signed)

## 2014-08-27 NOTE — ED Notes (Signed)
Pt states he was sitting in the chair tonight watching TV and nose started bleeding

## 2014-08-27 NOTE — Telephone Encounter (Signed)
thanks

## 2014-08-27 NOTE — ED Provider Notes (Signed)
CSN: [id number]     Arrival date & time 08/27/14  2011 History  This chart was scribed for Mirian Mo, MD by Evon Slack, ED Scribe. This patient was seen in room MH04/MH04 and the patient's care was started at 8:42 PM.     Chief Complaint  Patient presents with  . Epistaxis    Patient is a 73 y.o. male presenting with nosebleeds. The history is provided by the patient. No language interpreter was used.  Epistaxis Location:  L nare Severity:  Moderate Duration:  45 minutes Timing:  Constant Progression:  Unchanged Chronicity:  New Context: home oxygen   Context: not nose picking   Relieved by:  Applying pressure Worsened by:  Nothing tried  HPI Comments: Bradley [name] is a 73 y.o. male who presents to the Emergency Department complaining of left nare  epistaxis onset tonight 45 min PTA. He states that he was recently in admitted for COPD 1 week ago and was on a blood thinner while in the hospital. He states that he had one nose bleed in the hospital 3 days ago. He states that he was watching TV when the nose bleed began. Pt states that he is on home oxygen.    Past Medical History  Diagnosis Date  . HTN (hypertension)   . HLD (hyperlipidemia)   . BPH (benign prostatic hyperplasia)     Saw Dr [name] 2004, normal renal u/s  . Pleural effusion 2008    S/p decortication  . Insomnia   . Atrial fibrillation     s/p AV node ablation & BiV PPM implantation 09/08/11 (op dictation pending)  . Pulmonary HTN     per cath 2008  . Peripheral vascular disease     ??  . Anemia     intermittent  . Pacemaker   . Heart murmur   . Type II diabetes mellitus 1999  . On home oxygen therapy     "sleep w/2L q hs & during day prn" (08/22/2014)  . 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 CAD (coronary artery disease)   . Abnormal CT scan, chest 2012    CT chest, several lymphadenopathies. Sees pulmonary  . CHF (congestive heart failure)     Thought primarily  to be non-systolic although EF down (EF 68-34% 12/2010, down to 35-40% 09/05/11), cath 2008 with no CAD, nuclear study 07/2011 showing Small area of reversibility in the distal ant/lat wall the left ventricle suspicious for ischemia/septal wall HK but felt to be low risk  (per D/C Summary 07/2011)  . Chronic bronchitis     "get it ~ q yr"  . Migraines     "very very rare"  . Arthritis     "?back" (08/22/2014)   Past Surgical History  Procedure Laterality Date  . Pleural scarification    . Lung decortication    . Colonoscopy  03/10/11    normal  . Pneumothorax with fibrothorax  ~ 2010  . Insert / replace / remove pacemaker  09/08/11    pacemaker placement  . Tonsillectomy      "as a kid" (09/27/2012)  . Cardiac catheterization      "couple times; never had balloon or stent" (09/27/2012)   Family History  Problem Relation Age of Onset  . Diabetes Father   . Breast cancer Maternal Aunt   . Prostate cancer Neg Hx   . Colon cancer Neg Hx   . Coronary artery disease Father   .  Polycythemia Mother    History  Substance Use Topics  . Smoking status: Former Smoker -- 2.50 packs/day for 25 years    Types: Cigarettes    Quit date: 10/11/1980  . Smokeless tobacco: Never Used     Comment: used to smoke 2.5 ppd for 25 years  . Alcohol Use: Yes     Comment: 08/22/2014 "maybe q couple months I'll have a beer w/dinner"    Review of Systems  HENT: Positive for nosebleeds.   All other systems reviewed and are negative.     Allergies  Hydrocodone; Tramadol; Tadalafil; and Avelox  Home Medications   Prior to Admission medications   Medication Sig Start Date End Date Taking? Authorizing Provider  ADVICOR 1000-20 MG 24 hr tablet TAKE 1 TABLET AT BEDTIME 06/12/14   [name], MD  albuterol (PROVENTIL HFA;VENTOLIN HFA) 108 (90 BASE) MCG/ACT inhaler Inhale 2 puffs into the lungs every 4 (four) hours as needed for wheezing or shortness of breath. 08/25/14   Lonia Blood, MD   amLODipine (NORVASC) 10 MG tablet TAKE 1 TABLET DAILY 06/07/14   [name], MD  aspirin 81 MG tablet Take 81 mg by mouth daily.    Historical Provider, MD  carvedilol (COREG) 25 MG tablet TAKE ONE AND ONE-HALF TABLETS TWICE A DAY WITH MEALS 08/09/14   [name], MD  furosemide (LASIX) 40 MG tablet TAKE ONE AND ONE-HALF TABLETS (60 MG) TWICE A DAY    [name], MD  JANUVIA 100 MG tablet TAKE 1 TABLET DAILY    [name], MD  levofloxacin (LEVAQUIN) 500 MG tablet Take 1 tablet (500 mg total) by mouth daily. 08/26/14   Lonia Blood, MD  losartan (COZAAR) 100 MG tablet TAKE 1 TABLET DAILY 07/16/14   [name], MD  Multiple Vitamins-Minerals (MULTIVITAMINS THER. W/MINERALS) TABS tablet Take 1 tablet by mouth daily. 09/14/13   [name], MD  nitroGLYCERIN (NITROSTAT) 0.4 MG SL tablet Place 1 tablet (0.4 mg total) under the tongue every 5 (five) minutes as needed for chest pain. 07/10/12   Laveda Norman, MD  ONE TOUCH ULTRA TEST test strip USE AS DIRECTED 07/19/14   [name], MD  pantoprazole (PROTONIX) 40 MG tablet TAKE 1 TABLET EVERY MORNING 04/08/14   [name], MD  predniSONE (DELTASONE) 10 MG tablet 4 tablets a day on 11/16, then 3 tablets a day on 11/17 and 11/18, then 2 tablets a day on 11/19 and 11/20, then 1 tablet a day on 11/21 and 11/22, then stop 08/25/14   Lonia Blood, MD  tiotropium (SPIRIVA) 18 MCG inhalation capsule Place 1 capsule (18 mcg total) into inhaler and inhale daily. 08/25/14   Lonia Blood, MD  zolpidem (AMBIEN) 10 MG tablet Take 1 tablet (10 mg total) by mouth at bedtime as needed. For sleep 09/14/13   [name], MD   Triage Vitals: BP 143/77 mmHg  Pulse 82  Resp 18  Ht 5\' 10"  (1.778 m)  Wt 213 lb (96.616 kg)  BMI 30.56 kg/m2  SpO2 92%  Physical Exam  Constitutional: He is oriented to person, place, and time. He appears well-developed and well-nourished. No distress.  HENT:  Head: Normocephalic and atraumatic.  Nose: No epistaxis.  Eyes:  Conjunctivae and EOM are normal.  Neck: Normal range of motion. Neck supple. No tracheal deviation present.  Cardiovascular: Normal rate, regular rhythm and normal heart sounds.   Pulmonary/Chest: Effort normal and  breath sounds normal. No respiratory distress.  Abdominal: He exhibits no distension. There is no tenderness. There is no rebound and no guarding.  Musculoskeletal: Normal range of motion.  Neurological: He is alert and oriented to person, place, and time.  Skin: Skin is warm and dry.  Psychiatric: He has a normal mood and affect. His behavior is normal.  Nursing note and vitals reviewed.   ED Course  Procedures (including critical care time) DIAGNOSTIC STUDIES: Oxygen Saturation is 92% on RA, low by my interpretation.    COORDINATION OF CARE: 8:53 PM-Discussed treatment plan which includes afrin with pt at bedside and pt agreed to plan.     Labs Review Labs Reviewed - No data to display  Imaging Review No results found.   EKG Interpretation None      MDM   Final diagnoses:  Epistaxis   73 y.o. male with pertinent PMH of COPD on home O2 presents with recurrent epistaxis from L nare.  No trauma or clear precipitants night. Patient is no longer on anticoagulants.  On my examination the patient no longer has epistaxis. It appears that this responded to digital compression.  No anterior or posterior source of bleeding identified. Patient ambulated around department and did not have recurrence.  Discharge home with standard return precautions and instructions to hold pressure if bleeding recurred. Small amount of bleeding at home, no systemic symptoms of fatigue or lightheadedness consistent with acute blood loss anemia. Discharged home in stable condition..    1. Epistaxis           Mirian MoMatthew Keilan Nichol, MD 08/28/14 [phone]

## 2014-08-27 NOTE — Telephone Encounter (Signed)
Date of Admission: 08/22/2014 Date of Discharge: 08/25/2014  Reason for admission:  Acute on chronic resp failure with hypoxia  Transition Care Management Follow-up Telephone Call  How have you been since you were released from the hospital? Pt states that he's doing somewhat better.  Wearing oxygen and taking medications as prescribed.  Denies shortness of breath, chest pain, chest tightness, or frequent coughing.  He did share that he becomes somewhat short of breath with exertion, but knows to take inhalers as needed and to wear oxygen.     Do you understand why you were in the hospital? Yes    Do you understand the discharge instructions? yes  Items Reviewed:  Medications reviewed: yes; new medications were also reviewed with patient.  Allergies reviewed: yes  Dietary changes reviewed: no change in diet  Referrals reviewed: yes, has an appointment with pulmonary on 09/06/14 @ 11:15 am.     Functional Questionnaire:   Activities of Daily Living (ADLs):   He states they are independent in the following: ambulation, bathing and hygiene, feeding, continence, grooming, toileting and dressing States they require assistance with the following: none   Any transportation issues/concerns?: no; pt drives with wife in car.   Any patient concerns? no   Confirmed importance and date/time of follow-up visits scheduled: yes   Confirmed with patient if condition begins to worsen call PCP or go to the ER.  Patient was given the Call-a-Nurse line [phone]: yes  Hospital Follow Up scheduled with [name] on 08/30/14 @ 11:30 am.

## 2014-08-30 ENCOUNTER — Encounter: Payer: Self-pay | Admitting: Internal Medicine

## 2014-08-30 ENCOUNTER — Ambulatory Visit (INDEPENDENT_AMBULATORY_CARE_PROVIDER_SITE_OTHER): Payer: Medicare Other | Admitting: Internal Medicine

## 2014-08-30 VITALS — BP 122/64 | HR 87 | Temp 97.6°F | Wt 208.4 lb

## 2014-08-30 DIAGNOSIS — F419 Anxiety disorder, unspecified: Secondary | ICD-10-CM

## 2014-08-30 DIAGNOSIS — J9611 Chronic respiratory failure with hypoxia: Secondary | ICD-10-CM

## 2014-08-30 DIAGNOSIS — E119 Type 2 diabetes mellitus without complications: Secondary | ICD-10-CM | POA: Diagnosis not present

## 2014-08-30 NOTE — Progress Notes (Signed)
Subjective:    Patient ID: Bradley [name], male    DOB: 07/14/1941, 73 y.o.   MRN: [id number]  DOS:  08/30/2014 Type of visit - description : Hospital follow-up, chart reviewed, exerts Interval history: Date of Admission: 08/22/2014 Date of Discharge: 08/25/2014    Acute on chronic resp failure with hypoxia Felt to be due to AECOPD - tapered steroids to oral pred from IV solumedrol - weaned O2 to home dose of 2L w/ sats 90% or > - cleared for d/c home w/ completion of steroid taper and oral abx tx - spiriva and albuterol per PCCM recs  SSCP Likely situational stress related (family argument), but pt high risk for CAD - EKGs unrevealing - cycled enzymes all negative - sx resolved at time of d/c   Hx of COPD  Has not smoked in 30 years, but was a "very heavy" smoker - on 2LPM O2 at all times at home   Chronic combined diastolic and systolic CHF  EF 40-98%45-50% via TTE Sept 2013 - repeat TTE notes improvement in EF - no clinical signs of CHF exac  Hx afib s/p ablation w/ pacer Nov 2012 [facility]  HTN BP well controlled at present   HLD Resume advicor   DM2 CBG uncontrolled in setting of steroid use - taper steroids quickly and resume usual tx at home post d/c    ROS Since he left the hospital, he had an episode of epistaxis , went to the ER, symptoms resolved. Denies fever chills Cough, difficulty breathing back to baseline, he is feeling well. No sputum production.   Emotionally he still has some problems with his son.   Past Medical History  Diagnosis Date  . HTN (hypertension)   . HLD (hyperlipidemia)   . BPH (benign prostatic hyperplasia)     Saw Dr Wanda PlumpHumphries 2004, normal renal u/s  . Pleural effusion 2008    S/p decortication  . Insomnia   . Atrial fibrillation     s/p AV node ablation & BiV PPM implantation 09/08/11 (op dictation pending)  . Pulmonary HTN     per cath 2008  . Peripheral vascular disease     ??  . Anemia     intermittent  .  Pacemaker   . Heart murmur   . Type II diabetes mellitus 1999  . On home oxygen therapy     "sleep w/2L q hs & during day prn" (08/22/2014)  . 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 CAD (coronary artery disease)   . Abnormal CT scan, chest 2012    CT chest, several lymphadenopathies. Sees pulmonary  . CHF (congestive heart failure)     Thought primarily to be non-systolic although EF down (EF 11-91%45-50% 12/2010, down to 35-40% 09/05/11), cath 2008 with no CAD, nuclear study 07/2011 showing Small area of reversibility in the distal ant/lat wall the left ventricle suspicious for ischemia/septal wall HK but felt to be low risk  (per D/C Summary 07/2011)  . Chronic bronchitis     "get it ~ q yr"  . Migraines     "very very rare"  . Arthritis     "?back" (08/22/2014)    Past Surgical History  Procedure Laterality Date  . Pleural scarification    . Lung decortication    . Colonoscopy  03/10/11    normal  . Pneumothorax with fibrothorax  ~ 2010  . Insert / replace / remove pacemaker  09/08/11  pacemaker placement  . Tonsillectomy      "as a kid" (09/27/2012)  . Cardiac catheterization      "couple times; never had balloon or stent" (09/27/2012)    History   Social History  . Marital Status: Married    Spouse Name: Bradley [name]    Number of Children: 1  . 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 Smoking status: Former Smoker -- 2.50 packs/day for 25 years    Types: Cigarettes    Quit date: 10/11/1980  . Smokeless tobacco: Never Used     Comment: used to smoke 2.5 ppd for 25 years  . Alcohol Use: Yes     Comment: 08/22/2014 "maybe q couple months I'll have a beer w/dinner"  . Drug Use: No  . Sexual Activity: Not Currently   Other Topics Concern  . Not on file   Social History Narrative   Moved from [place] 2003.[place]   He lives w/ his wife                  Medication List         This list is accurate as of: 08/30/14 11:59 PM.  Always use your most recent med list.               ADVICOR 1000-20 MG 24 hr tablet  Generic drug:  niacin-lovastatin  TAKE 1 TABLET AT BEDTIME     albuterol 108 (90 BASE) MCG/ACT inhaler  Commonly known as:  PROVENTIL HFA;VENTOLIN HFA  Inhale 2 puffs into the lungs every 4 (four) hours as needed for wheezing or shortness of breath.     amLODipine 10 MG tablet  Commonly known as:  NORVASC  TAKE 1 TABLET DAILY     aspirin 81 MG tablet  Take 81 mg by mouth daily.     carvedilol 25 MG tablet  Commonly known as:  COREG  TAKE ONE AND ONE-HALF TABLETS TWICE A DAY WITH MEALS     furosemide 40 MG tablet  Commonly known as:  LASIX  TAKE ONE AND ONE-HALF TABLETS (60 MG) TWICE A DAY     JANUVIA 100 MG tablet  Generic drug:  sitaGLIPtin  TAKE 1 TABLET DAILY     levofloxacin 500 MG tablet  Commonly known as:  LEVAQUIN  Take 1 tablet (500 mg total) by mouth daily.     losartan 100 MG tablet  Commonly known as:  COZAAR  TAKE 1 TABLET DAILY     multivitamins ther. w/minerals Tabs tablet  Take 1 tablet by mouth daily.     nitroGLYCERIN 0.4 MG SL tablet  Commonly known as:  NITROSTAT  Place 1 tablet (0.4 mg total) under the tongue every 5 (five) minutes as needed for chest pain.     ONE TOUCH ULTRA TEST test strip  Generic drug:  glucose blood  USE AS DIRECTED     pantoprazole 40 MG tablet  Commonly known as:  PROTONIX  TAKE 1 TABLET EVERY MORNING     tiotropium 18 MCG inhalation capsule  Commonly known as:  SPIRIVA  Place 1 capsule (18 mcg total) into inhaler and inhale daily.     zolpidem 10 MG tablet  Commonly known as:  AMBIEN  Take 1 tablet (10 mg total) by mouth at bedtime as needed. For sleep           Objective:   Physical Exam BP  122/64 mmHg  Pulse 87  Temp(Src) 97.6 F (36.4 C) (Oral)  Wt 208 lb 6 oz (94.518 kg)  SpO2 95%  General -- alert, well-developed, NAD.  Lungs -- normal respiratory effort,  no intercostal retractions, no accessory muscle use, and decreased breath sounds.  Heart-- normal rate, regular rhythm, no murmur.  Abdomen-- Not distended, good bowel sounds,soft, non-tender. Extremities-- no pretibial edema bilaterally  Neurologic--  alert & oriented X3. Speech normal, gait appropriate for age, strength symmetric and appropriate for age.   Psych-- Cognition and judgment appear intact. Cooperative with normal attention span and concentration. No anxious or depressed appearing.       Assessment & Plan:   COPD exacerbation, Better, O2 sat normal on room air, continue with prednisone, Levaquin, Spiriva. Follow-up with pulmonary as recommended Labs at the hospital okay except for a slightly low sodium, will recheck on return to the office  Diabetes, on the steroids, CBGs around 200. Anticipate CBGs will be better once he stops prednisone  Epistaxis , using vasoconstrictors topically as needed, has not have any problems in the last 48 hours.

## 2014-08-30 NOTE — Progress Notes (Signed)
Pre visit review using our clinic review tool, if applicable. No additional management support is needed unless otherwise documented below in the visit note. 

## 2014-08-30 NOTE — Patient Instructions (Signed)
Continue with prednisone and Levaquin until you finish the medications Spiriva every day Pro Air only if you have cough wheezing Other medications are the same Come back in one month, call if you need me sooner or if you have fever, chills, increased difficulty breathing, cough.

## 2014-08-31 NOTE — Assessment & Plan Note (Signed)
Long history of insomnia, now also with anxiety. Has a lot of family issues, his counseled to the best of my ability.

## 2014-09-06 ENCOUNTER — Encounter: Payer: Self-pay | Admitting: Internal Medicine

## 2014-09-06 ENCOUNTER — Ambulatory Visit (INDEPENDENT_AMBULATORY_CARE_PROVIDER_SITE_OTHER): Payer: Medicare Other | Admitting: Internal Medicine

## 2014-09-06 VITALS — BP 102/60 | HR 82 | Ht 69.0 in | Wt 214.0 lb

## 2014-09-06 DIAGNOSIS — I272 Other secondary pulmonary hypertension: Secondary | ICD-10-CM | POA: Diagnosis not present

## 2014-09-06 DIAGNOSIS — J9611 Chronic respiratory failure with hypoxia: Secondary | ICD-10-CM

## 2014-09-06 DIAGNOSIS — J209 Acute bronchitis, unspecified: Secondary | ICD-10-CM

## 2014-09-06 NOTE — Progress Notes (Signed)
Subjective:    Patient ID: Bradley [name], male    DOB: 03/29/1941     MRN: [id number]   Brief patient profile:  [age] yowm quit smoking in 1982 prev pulm  eval in 2008 with pleural effusions but all better with no resp issues until Aug 2012 when dx with bronchitis and "downhill since" so referred by Dr [name] back to pulmonary clinic in Nov 2012 and ultimately proven to have no sign copd by pft's 10/28/2011 and sob attributed to chf.   HPI 08/27/2011 1st pulmonary eval in emr era cc new onset cough indolent persistent, daily x 3 months,  typically p supper with minimal yellow mucus  Assoc with new sob x sev flights and stop at top of 3 rd -  50% better better p rx with pna no better with albuterol.  No overt sinus or reflux symptoms rec Prednisone 10 mg take  4 each am x 2 days,   2 each am x 2 days,  1 each am x2days and stop  Change lopressor to 50mg  one half in am and one half in pm Protonix 30-60 min before bfast and Pepcid 20mg  one at bedtime  GERD diet   Admit date: 09/20/2011  Discharge date: 09/23/2011  Discharge Diagnoses  Systolic CHF, acute on chronic  Permanent atrial fibrillation  Hypokalemia  Lymphadenopathy  09/24/2011 [facility] follow-up. Was hospitalized for trouble breathing. Patient states he is better since getting out of the hospital.  rec Only use your albuterol (proventil or ventolin)  as a rescue medication to be used if you can't catch your breath > not using  Keep on 02 24 hours per day @ 2lpm     10/28/2011 f/u ov/Bradley [name] cc Shortness of Breath Improved since last visit on 02 and with rx of chf, no cough or daytime need for saba - on 02 can walk flat and slow as much as desired rec Stay on 02 24 hours per day - this will help your heart You do not have significant lung problems other than what is related to the congestive heart failure so pulmonary follow up will be as needed     Date of Admission: 08/22/2014 Date of Discharge: 08/25/2014  Attending  Physician:MCCLUNG,JEFFREY T  Patient's WUJ:[id number]:Jose [name], MD  Consults: PCCM  Disposition: D/C home   Discharge Diagnoses: Acute on chronic resp failure with hypoxia SSCP Hx of COPD  Chronic combined diastolic and systolic CHF  Hx afib s/p ablation w/ pacer Nov 2012 HTN HLD DM2  Initial presentation: 73 y.o. male with a hx of HTN, HLD, and systolic and diastolic chf who presented from outpatient clinic after failing tx for bronchitis with doxycycline. Pt reportedly progressively became more dyspneic and sob. A cxr was found to be without evidence of PNA or pulm edema. He was noted to have O2 sats in the mid-80's on O2. The patient was then directly admitted to stepdown.   Hospital Course:  Acute on chronic resp failure with hypoxia Felt to be due to AECOPD - tapered steroids to oral pred from IV solumedrol - weaned O2 to home dose of 2L w/ sats 90% or > - cleared for d/c home w/ completion of steroid taper and oral abx tx - spiriva and albuterol per PCCM recs  SSCP Likely situational stress related (family argument), but pt high risk for CAD - EKGs unrevealing - cycled enzymes all negative - sx resolved at time of d/c   Hx of COPD  Has not  smoked in 30 years, but was a "very heavy" smoker - on 2LPM O2 at all times at home   Chronic combined diastolic and systolic CHF  EF 56-38% via TTE Sept 2013 - repeat TTE notes improvement in EF - no clinical signs of CHF exac  Hx afib s/p ablation w/ pacer Nov 2012 Followed by [name] EP  HTN BP well controlled at present   HLD Resume advicor   DM2 CBG uncontrolled in setting of steroid use - taper steroids quickly and resume usual tx at home post d/c     09/06/2014 post hosp f/u/ transition of care ov / re-establish care / /Bradley [name] / newly started on spiriva despite nl baseline pfts  Chief Complaint  Patient presents with  . Hospitalization Follow-up    Feeling well since leaving the hospital. Reports coughing from time to  time, when he does it came be dry but at times it can be productive. Denies chest tightness, SOB or wheezing.  breathing much better now= back to baseline, ok x 3 flights/ uses 2lpm when walking dog and sometimes shopping    At baseline before deterioration rarely needed any albuterol at all ? How compliant he was with prev 02 recs  Main c/o is day > noct cough dry > wet still present and at times quite severe      No obvious day to day or daytime variabilty or assoc chronic cough or cp or chest tightness, subjective wheeze overt sinus or hb symptoms. No unusual exp hx or h/o childhood pna/ asthma or knowledge of premature birth.  Sleeping ok without nocturnal  or early am exacerbation  of respiratory  c/o's or need for noct saba. Also denies any obvious fluctuation of symptoms with weather or environmental changes or other aggravating or alleviating factors except as outlined above   Current Medications, Allergies, Complete Past Medical History, Past Surgical History, Family History, and Social History were reviewed in [employer] record.  ROS  The following are not active complaints unless bolded sore throat, dysphagia, dental problems, itching, sneezing,  nasal congestion or excess/ purulent secretions, ear ache,   fever, chills, sweats, unintended wt loss, pleuritic or exertional cp, hemoptysis,  orthopnea pnd or leg swelling, presyncope, palpitations, heartburn, abdominal pain, anorexia, nausea, vomiting, diarrhea  or change in bowel or urinary habits, change in stools or urine, dysuria,hematuria,  rash, arthralgias, visual complaints, headache, numbness weakness or ataxia or problems with walking or coordination,  change in mood/affect or memory.                   Objective:   Physical Exam  Edentulous wm nad harsh cough and freq throat clearing   08/27/2011   217 > 09/24/2011  210 > 217 10/28/2011 > 09/06/2014  214  HEENT: nl dentition, turbinates, and  orophanx. Nl external ear canals without cough reflex   NECK :  without JVD/Nodes/TM/ nl carotid upstrokes bilaterally   LUNGS: no acc muscle use, clear to A and P bilaterally without cough on insp or exp maneuvers   CV:  RRR  no s3 or murmur or increase in P2, no edema   ABD:  soft and nontender with nl excursion in the supine position. No bruits or organomegaly, bowel sounds nl  MS:  warm without deformities, calf tenderness, cyanosis or clubbing  SKIN: warm and dry without lesions    NEURO:  alert, approp, no deficits         cxr 08/22/14  There has not been significant interval change in the appearance of the chest since the previous study. Chronic bronchitic changes are present. The chronic prominence of the pulmonary vascularity centrally is consistent with the history of pulmonary hypertension. There is no evidence of acute CHF nor of pneumonia.       Assessment & Plan:

## 2014-09-06 NOTE — Patient Instructions (Addendum)
Stop spiriva as you have no copd   Work on inhaler technique:  relax and gently blow all the way out then take a nice smooth deep breath back in, triggering the inhaler at same time you start breathing in.  Hold for up to 5 seconds if you can.  Rinse and gargle with water when done   Only use your albuterol (proair) as a rescue medication to be used if you can't catch your breath by resting or doing a relaxed purse lip breathing pattern.  - The less you use it, the better it will work when you need it. - Ok to use up to 2 puffs  every 4 hours if you must but call for immediate appointment if use goes up over your usual need - Don't leave home without it !!  (think of it like the spare tire for your car)   Pantoprazole (protonix) 40 mg   Take 30-60 min before first meal of the day and Pepcid ac  20 mg one bedtime until return to office - this is the best way to tell whether stomach acid is contributing to your problem.   GERD (REFLUX)  is an extremely common cause of respiratory symptoms just like yours , many times with no obvious heartburn at all.    It can be treated with medication, but also with lifestyle changes including avoidance of late meals, excessive alcohol, smoking cessation, and avoid fatty foods, chocolate, peppermint, colas, red wine, and acidic juices such as orange juice.  NO MINT OR MENTHOL PRODUCTS SO NO COUGH DROPS  USE SUGARLESS CANDY INSTEAD (Jolley ranchers or Stover's or Life Savers) or even ice chips will also do - the key is to swallow to prevent all throat clearing. NO OIL BASED VITAMINS - use powdered substitutes.    Please schedule a follow up office visit in 6 weeks, call sooner if needed with pfts / cxr

## 2014-09-07 DIAGNOSIS — J209 Acute bronchitis, unspecified: Secondary | ICD-10-CM | POA: Insufficient documentation

## 2014-09-07 NOTE — Assessment & Plan Note (Addendum)
His acute flare with cough is better characterized as acute bronchitis than aecopd as he has no copd (could have been uacs masquerading as asthma or copd as note still has upper airway pattern cough at f/u post hosp)  He has improved with rx as AB but does not require chronic LAMA at this point which may aggravate upper airway cough.  The proper method of use, as well as anticipated side effects, of a metered-dose inhaler are discussed and demonstrated to the patient. Improved effectiveness after extensive coaching during this visit to a level of approximately  75%  Will return for fresh pfts/ cxr and a look at what his long term rx and f/u should entail p holidays   In meantime, Explained the natural history of uri and why it's necessary in patients at risk to treat GERD aggressively - at least  short term -   to reduce risk of evolving cyclical cough initially  triggered by epithelial injury and a heightened sensitivty to the effects of any upper airway irritants,  most importantly acid - related - then perpetuated by epithelial injury related to the cough itself as the upper airway collapses on itself.  That is, the more sensitive the epithelium becomes once it is damaged by the virus, the more the ensuing irritability> the more the cough, the more the secondary reflux (especially in those prone to reflux) the more the irritation of the sensitive mucosa and so on in a  Classic cyclical pattern.      Each maintenance medication was reviewed in detail including most importantly the difference between maintenance and as needed and under what circumstances the prns are to be used.  Please see instructions for details which were reviewed in writing and the patient given a copy.

## 2014-09-07 NOTE — Assessment & Plan Note (Signed)
Systolic function was normal. The estimated ejection fraction was in the range of 50% to 55%. - Mitral valve: There was mild regurgitation. - Left atrium: The atrium was severely dilated. - Right ventricle: The cavity size was mildly dilated. Wall thickness was normal. Systolic function was moderately reduced. - Right atrium: The atrium was severely dilated. - Tricuspid valve: There was moderate regurgitation. - Pulmonary arteries: PA peak pressure: 50 mm Hg  Main rx is 02, reinforced

## 2014-09-07 NOTE — Assessment & Plan Note (Signed)
-   started on 02 2lpm at discharge 09/23/11   - PFT's 10/28/2011  FEV1  1.40 (51%)  with ratio 70 and no better p B2 and DLCO 53 corrects to 101   So no evidence of copd, main rx is 02, reviewed

## 2014-09-09 ENCOUNTER — Other Ambulatory Visit: Payer: Self-pay

## 2014-09-12 ENCOUNTER — Encounter (HOSPITAL_BASED_OUTPATIENT_CLINIC_OR_DEPARTMENT_OTHER): Payer: Self-pay | Admitting: *Deleted

## 2014-09-12 ENCOUNTER — Emergency Department (HOSPITAL_BASED_OUTPATIENT_CLINIC_OR_DEPARTMENT_OTHER)
Admission: EM | Admit: 2014-09-12 | Discharge: 2014-09-12 | Disposition: A | Discharge status: Home / Self Care | Payer: Medicare Other | Attending: Emergency Medicine | Admitting: Emergency Medicine

## 2014-09-12 DIAGNOSIS — I10 Essential (primary) hypertension: Secondary | ICD-10-CM | POA: Insufficient documentation

## 2014-09-12 DIAGNOSIS — I2510 Atherosclerotic heart disease of native coronary artery without angina pectoris: Secondary | ICD-10-CM | POA: Insufficient documentation

## 2014-09-12 DIAGNOSIS — Z792 Long term (current) use of antibiotics: Secondary | ICD-10-CM | POA: Diagnosis not present

## 2014-09-12 DIAGNOSIS — M1990 Unspecified osteoarthritis, unspecified site: Secondary | ICD-10-CM | POA: Diagnosis not present

## 2014-09-12 DIAGNOSIS — Z87438 Personal history of other diseases of male genital organs: Secondary | ICD-10-CM | POA: Insufficient documentation

## 2014-09-12 DIAGNOSIS — Z87891 Personal history of nicotine dependence: Secondary | ICD-10-CM | POA: Diagnosis not present

## 2014-09-12 DIAGNOSIS — R040 Epistaxis: Secondary | ICD-10-CM | POA: Diagnosis not present

## 2014-09-12 DIAGNOSIS — R011 Cardiac murmur, unspecified: Secondary | ICD-10-CM | POA: Insufficient documentation

## 2014-09-12 DIAGNOSIS — Z9889 Other specified postprocedural states: Secondary | ICD-10-CM | POA: Insufficient documentation

## 2014-09-12 DIAGNOSIS — G4700 Insomnia, unspecified: Secondary | ICD-10-CM | POA: Diagnosis not present

## 2014-09-12 DIAGNOSIS — Z9981 Dependence on supplemental oxygen: Secondary | ICD-10-CM | POA: Diagnosis not present

## 2014-09-12 DIAGNOSIS — I739 Peripheral vascular disease, unspecified: Secondary | ICD-10-CM | POA: Diagnosis not present

## 2014-09-12 DIAGNOSIS — E785 Hyperlipidemia, unspecified: Secondary | ICD-10-CM | POA: Diagnosis not present

## 2014-09-12 DIAGNOSIS — E119 Type 2 diabetes mellitus without complications: Secondary | ICD-10-CM | POA: Diagnosis not present

## 2014-09-12 DIAGNOSIS — I509 Heart failure, unspecified: Secondary | ICD-10-CM | POA: Insufficient documentation

## 2014-09-12 DIAGNOSIS — Z79899 Other long term (current) drug therapy: Secondary | ICD-10-CM | POA: Insufficient documentation

## 2014-09-12 DIAGNOSIS — I4891 Unspecified atrial fibrillation: Secondary | ICD-10-CM | POA: Insufficient documentation

## 2014-09-12 DIAGNOSIS — Z7982 Long term (current) use of aspirin: Secondary | ICD-10-CM | POA: Diagnosis not present

## 2014-09-12 DIAGNOSIS — Z8709 Personal history of other diseases of the respiratory system: Secondary | ICD-10-CM | POA: Diagnosis not present

## 2014-09-12 DIAGNOSIS — Z950 Presence of cardiac pacemaker: Secondary | ICD-10-CM | POA: Insufficient documentation

## 2014-09-12 DIAGNOSIS — D649 Anemia, unspecified: Secondary | ICD-10-CM | POA: Diagnosis not present

## 2014-09-12 MED ORDER — CEPHALEXIN 500 MG PO CAPS: 500.0000 mg | ORAL_CAPSULE | Freq: Three times a day (TID) | ORAL | Status: DC

## 2014-09-12 NOTE — ED Provider Notes (Signed)
CSN: [id number]     Arrival date & time 09/12/14  1305 History   First MD Initiated Contact with Patient 09/12/14 1313     Chief Complaint  Patient presents with  . Epistaxis     (Consider location/radiation/quality/duration/timing/severity/associated sxs/prior Treatment) Patient is a 73 y.o. male presenting with nosebleeds. The history is provided by the patient.  Epistaxis Location:  L nare Severity:  Moderate Timing:  Constant Progression:  Unchanged Chronicity:  Recurrent Context: aspirin use and nose picking   Relieved by:  Applying pressure Worsened by:  Nothing tried Associated symptoms: no cough and no fever     Past Medical History  Diagnosis Date  . HTN (hypertension)   . HLD (hyperlipidemia)   . BPH (benign prostatic hyperplasia)     Saw Dr Wanda PlumpHumphries 2004, normal renal u/s  . Pleural effusion 2008    S/p decortication  . Insomnia   . Atrial fibrillation     s/p AV node ablation & BiV PPM implantation 09/08/11 (op dictation pending)  . Pulmonary HTN     per cath 2008  . Peripheral vascular disease     ??  . Anemia     intermittent  . Pacemaker   . Heart murmur   . Type II diabetes mellitus 1999  . On home oxygen therapy     "sleep w/2L q hs & during day prn" (08/22/2014)  . 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 CAD (coronary artery disease)   . Abnormal CT scan, chest 2012    CT chest, several lymphadenopathies. Sees pulmonary  . CHF (congestive heart failure)     Thought primarily to be non-systolic although EF down (EF 82-95%45-50% 12/2010, down to 35-40% 09/05/11), cath 2008 with no CAD, nuclear study 07/2011 showing Small area of reversibility in the distal ant/lat wall the left ventricle suspicious for ischemia/septal wall HK but felt to be low risk  (per D/C Summary 07/2011)  . Chronic bronchitis     "get it ~ q yr"  . Migraines     "very very rare"  . Arthritis     "?back" (08/22/2014)   Past Surgical History  Procedure  Laterality Date  . Pleural scarification    . Lung decortication    . Colonoscopy  03/10/11    normal  . Pneumothorax with fibrothorax  ~ 2010  . Insert / replace / remove pacemaker  09/08/11    pacemaker placement  . Tonsillectomy      "as a kid" (09/27/2012)  . Cardiac catheterization      "couple times; never had balloon or stent" (09/27/2012)   Family History  Problem Relation Age of Onset  . Diabetes Father   . Breast cancer Maternal Aunt   . Prostate cancer Neg Hx   . Colon cancer Neg Hx   . Coronary artery disease Father   . Polycythemia Mother    History  Substance Use Topics  . Smoking status: Former Smoker -- 2.50 packs/day for 25 years    Types: Cigarettes    Quit date: 10/11/1980  . Smokeless tobacco: Never Used     Comment: used to smoke 2.5 ppd for 25 years  . Alcohol Use: Yes     Comment: 08/22/2014 "maybe q couple months I'll have a beer w/dinner"    Review of Systems  Constitutional: Negative for fever and chills.  HENT: Positive for nosebleeds.   Respiratory: Negative for cough and shortness of breath.   All other  systems reviewed and are negative.     Allergies  Hydrocodone; Tramadol; Tadalafil; and Avelox  Home Medications   Prior to Admission medications   Medication Sig Start Date End Date Taking? Authorizing Provider  ADVICOR 1000-20 MG 24 hr tablet TAKE 1 TABLET AT BEDTIME 06/12/14   [name], MD  albuterol (PROVENTIL HFA;VENTOLIN HFA) 108 (90 BASE) MCG/ACT inhaler Inhale 2 puffs into the lungs every 4 (four) hours as needed for wheezing or shortness of breath. 08/25/14   Lonia Blood, MD  amLODipine (NORVASC) 10 MG tablet TAKE 1 TABLET DAILY 06/07/14   [name], MD  aspirin 81 MG tablet Take 81 mg by mouth daily.    Historical Provider, MD  carvedilol (COREG) 25 MG tablet TAKE ONE AND ONE-HALF TABLETS TWICE A DAY WITH MEALS 08/09/14   [name], MD  cephALEXin (KEFLEX) 500 MG capsule Take 1 capsule (500 mg total) by mouth 3 (three)  times daily. 09/12/14   Elwin Mocha, MD  furosemide (LASIX) 40 MG tablet TAKE ONE AND ONE-HALF TABLETS (60 MG) TWICE A DAY    [name], MD  JANUVIA 100 MG tablet TAKE 1 TABLET DAILY    [name], MD  losartan (COZAAR) 100 MG tablet TAKE 1 TABLET DAILY 07/16/14   [name], MD  Multiple Vitamins-Minerals (MULTIVITAMINS THER. W/MINERALS) TABS tablet Take 1 tablet by mouth daily. 09/14/13   [name], MD  nitroGLYCERIN (NITROSTAT) 0.4 MG SL tablet Place 1 tablet (0.4 mg total) under the tongue every 5 (five) minutes as needed for chest pain. 07/10/12   Laveda Norman, MD  ONE TOUCH ULTRA TEST test strip USE AS DIRECTED 07/19/14   [name], MD  pantoprazole (PROTONIX) 40 MG tablet TAKE 1 TABLET EVERY MORNING 04/08/14   [name], MD  zolpidem (AMBIEN) 10 MG tablet Take 1 tablet (10 mg total) by mouth at bedtime as needed. For sleep 09/14/13   [name], MD   BP 131/73 mmHg  Pulse 75  Temp(Src) 98 F (36.7 C) (Oral)  Resp 18  SpO2 97% Physical Exam  Constitutional: He is oriented to person, place, and time. He appears well-developed and well-nourished. No distress.  HENT:  Head: Normocephalic and atraumatic.  Nose: Epistaxis (L nare, profusely from unidentified source) is observed. Right sinus exhibits no maxillary sinus tenderness and no frontal sinus tenderness. Left sinus exhibits no maxillary sinus tenderness.  Mouth/Throat: No oropharyngeal exudate.  Eyes: EOM are normal. Pupils are equal, round, and reactive to light.  Neck: Normal range of motion. Neck supple.  Cardiovascular: Normal rate and regular rhythm.  Exam reveals no friction rub.   No murmur heard. Pulmonary/Chest: Effort normal and breath sounds normal. No respiratory distress. He has no wheezes. He has no rales.  Abdominal: He exhibits no distension. There is no tenderness. There is no rebound.  Musculoskeletal: Normal range of motion. He exhibits no edema.  Neurological: He is alert and oriented to person, place, and time.   Skin: He is not diaphoretic.    ED Course  EPISTAXIS MANAGEMENT Date/Time: 09/12/2014 3:16 PM Performed by: Elwin Mocha Authorized by: Elwin Mocha Consent: Verbal consent obtained. Preparation: Patient was prepped and draped in the usual sterile fashion. Patient sedated: no Treatment site: left posterior Repair method: silver nitrate and nasal balloon (7.5 cm posterior Rhino Rocket) Post-procedure assessment: bleeding decreased Treatment complexity: complex Patient tolerance: Patient tolerated the procedure well with no immediate complications   (including  critical care time) Labs Review Labs Reviewed - No data to display  Imaging Review No results found.   EKG Interpretation None      MDM   Final diagnoses:  Acute posterior epistaxis    74M here with L sided epistaxis. Began after picking his nose. I believed he had an anterior source since he was picking his nose, however I could not control the bleeding with silver nitrate and nare continued to copiously bleed without an easily identifiable source. I placed a posterior pack which achieved hemostasis. No further major bleeding after 30 minutes of observation. Given keflex, ENT f/u.    Elwin Mocha, MD 09/12/14 [id number]

## 2014-09-12 NOTE — ED Notes (Signed)
Pt to room 8 by ems via stretcher. Pt is able to stand and walk to stretcher with quick steady gait in nad. Pt is holding gauze to nose with pressure to bridge of nose. Ems reports they gave neo synephrine and nosebleed "has slowed to a trickle". Pt instructed to cont to hold pressure until md examines him. ent cart placed at bedside. Pt denies any pain or other c/o. Pt states he has a hx of epitaxis with er visits in the past.

## 2014-09-12 NOTE — Discharge Instructions (Signed)

## 2014-09-18 ENCOUNTER — Encounter (HOSPITAL_COMMUNITY): Payer: Self-pay | Admitting: Internal Medicine

## 2014-09-25 ENCOUNTER — Other Ambulatory Visit: Payer: Self-pay | Admitting: Internal Medicine

## 2014-09-30 ENCOUNTER — Encounter: Payer: Self-pay | Admitting: Internal Medicine

## 2014-09-30 ENCOUNTER — Telehealth: Payer: Self-pay | Admitting: Internal Medicine

## 2014-09-30 ENCOUNTER — Ambulatory Visit (INDEPENDENT_AMBULATORY_CARE_PROVIDER_SITE_OTHER): Payer: Medicare Other | Admitting: Internal Medicine

## 2014-09-30 VITALS — BP 128/64 | HR 87 | Temp 97.9°F | Wt 209.2 lb

## 2014-09-30 DIAGNOSIS — E785 Hyperlipidemia, unspecified: Secondary | ICD-10-CM

## 2014-09-30 DIAGNOSIS — I5042 Chronic combined systolic (congestive) and diastolic (congestive) heart failure: Secondary | ICD-10-CM | POA: Diagnosis not present

## 2014-09-30 DIAGNOSIS — J9611 Chronic respiratory failure with hypoxia: Secondary | ICD-10-CM

## 2014-09-30 DIAGNOSIS — E119 Type 2 diabetes mellitus without complications: Secondary | ICD-10-CM

## 2014-09-30 MED ORDER — ATORVASTATIN CALCIUM 40 MG PO TABS
40.0000 mg | ORAL_TABLET | Freq: Every day | ORAL | Status: DC
Start: 2014-09-30 — End: 2014-11-18

## 2014-09-30 MED ORDER — NITROGLYCERIN 0.4 MG SL SUBL: 0.4000 mg | SUBLINGUAL_TABLET | SUBLINGUAL | Status: DC | PRN

## 2014-09-30 NOTE — Progress Notes (Signed)
Pre visit review using our clinic review tool, if applicable. No additional management support is needed unless otherwise documented below in the visit note. 

## 2014-09-30 NOTE — Telephone Encounter (Signed)
Spoke with Pt, he informed me that he thought he was already on Lipitor, looked at past medication list and informed him that he has not been on Lipitor that I could see. Pt just wanted to make sure. Informed Pt he may finish his supply of Advicor before starting Lipitor. Pt informed me he has 1.5 month supply left. Pt stated that was all he needed to know, that he would begin taking Lipitor once Advicor supply is completed.

## 2014-09-30 NOTE — Patient Instructions (Signed)
Stop by the front desk and schedule labs to be done within few days (fasting)  Once you finish Advicor, start atorvastatin 40 mg one tablet at bedtime  Please come back to the office in 3 months  for a routine check up   Come back fasting

## 2014-09-30 NOTE — Assessment & Plan Note (Addendum)
Seems stable, check a BMP, continue with Lasix

## 2014-09-30 NOTE — Assessment & Plan Note (Signed)
Continue with Januvia, check A1c

## 2014-09-30 NOTE — Telephone Encounter (Signed)
Caller name: jermir Relation to pt: self Call back number: [phone] Pharmacy:  Reason for call:   Patient has questions regarding liptor rx

## 2014-09-30 NOTE — Assessment & Plan Note (Signed)
Currently on Advicor 1000-20,  it is getting very expensive. Plan: Continue Advicor until he runs out in 3 weeks, check FLP, switch to Lipitor

## 2014-09-30 NOTE — Assessment & Plan Note (Signed)
Currently doing well on albuterol as needed, nocturnal oxygen as needed.

## 2014-09-30 NOTE — Progress Notes (Signed)
Subjective:    Patient ID: Bradley [name], male    DOB: 04/19/1941, 73 y.o.   MRN: [id number]  DOS:  09/30/2014 Type of visit - description : rov, here w/ wife Interval history: In general feeling well, breathing is okay, using oxygen at nighttime, albuterol as needed. Med list checked, good compliance. Needs to change Advicor to another agent, see assessment and plan   ROS  Denies chest pain or lower extremity edema. No cough, sputum production or wheezing  Past Medical History  Diagnosis Date  . HTN (hypertension)   . HLD (hyperlipidemia)   . BPH (benign prostatic hyperplasia)     Saw Dr Wanda PlumpHumphries 2004, normal renal u/s  . Pleural effusion 2008    S/p decortication  . Insomnia   . Atrial fibrillation     s/p AV node ablation & BiV PPM implantation 09/08/11 (op dictation pending)  . Pulmonary HTN     per cath 2008  . Peripheral vascular disease     ??  . Anemia     intermittent  . Pacemaker   . Heart murmur   . Type II diabetes mellitus 1999  . On home oxygen therapy     at night as needed  . 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 CAD (coronary artery disease)   . Abnormal CT scan, chest 2012    CT chest, several lymphadenopathies. Sees pulmonary  . CHF (congestive heart failure)     Thought primarily to be non-systolic although EF down (EF 78-29%45-50% 12/2010, down to 35-40% 09/05/11), cath 2008 with no CAD, nuclear study 07/2011 showing Small area of reversibility in the distal ant/lat wall the left ventricle suspicious for ischemia/septal wall HK but felt to be low risk  (per D/C Summary 07/2011)  . Chronic bronchitis     "get it ~ q yr"  . Migraines     "very very rare"  . Arthritis     "?back" (08/22/2014)    Past Surgical History  Procedure Laterality Date  . Pleural scarification    . Lung decortication    . Colonoscopy  03/10/11    normal  . Pneumothorax with fibrothorax  ~ 2010  . Insert / replace / remove pacemaker  09/08/11   pacemaker placement  . Tonsillectomy      "as a kid" (09/27/2012)  . Cardiac catheterization      "couple times; never had balloon or stent" (09/27/2012)  . Bi-ventricular pacemaker insertion Left 09/08/2011    Procedure: BI-VENTRICULAR PACEMAKER INSERTION (CRT-P);  Surgeon: Marinus MawGregg W Taylor, MD;  [facility];  Service: Cardiovascular;  Laterality: Left;    History   Social History  . Marital Status: Married    Spouse Name: Bradley [name]    Number of Children: 1  . Years of Education: N/A   Occupational History  . retired Naval architecttruck driver    Social History Main Topics  . Smoking status: Former Smoker -- 2.50 packs/day for 25 years    Types: Cigarettes    Quit date: 10/11/1980  . Smokeless tobacco: Never Used     Comment: used to smoke 2.5 ppd for 25 years  . Alcohol Use: Yes     Comment: 08/22/2014 "maybe q couple months I'll have a beer w/dinner"  . Drug Use: No  . Sexual Activity: Not Currently   Other Topics Concern  . Not on file   Social History Narrative   [place]oved from [place] 2003..[place]  He lives w/ his wife                  Medication List       This list is accurate as of: 09/30/14  6:19 PM.  Always use your most recent med list.               ADVICOR 1000-20 MG 24 hr tablet  Generic drug:  niacin-lovastatin  TAKE 1 TABLET AT BEDTIME     albuterol 108 (90 BASE) MCG/ACT inhaler  Commonly known as:  PROVENTIL HFA;VENTOLIN HFA  Inhale 2 puffs into the lungs every 4 (four) hours as needed for wheezing or shortness of breath.     amLODipine 10 MG tablet  Commonly known as:  NORVASC  TAKE 1 TABLET DAILY     aspirin 81 MG tablet  Take 81 mg by mouth daily.     atorvastatin 40 MG tablet  Commonly known as:  LIPITOR  Take 1 tablet (40 mg total) by mouth daily.     carvedilol 25 MG tablet  Commonly known as:  COREG  TAKE ONE AND ONE-HALF TABLETS TWICE A DAY WITH MEALS     furosemide 40 MG tablet  Commonly known as:  LASIX  TAKE ONE AND ONE-HALF  TABLETS (60 MG) TWICE A DAY     JANUVIA 100 MG tablet  Generic drug:  sitaGLIPtin  TAKE 1 TABLET DAILY     losartan 100 MG tablet  Commonly known as:  COZAAR  TAKE 1 TABLET DAILY     multivitamins ther. w/minerals Tabs tablet  Take 1 tablet by mouth daily.     nitroGLYCERIN 0.4 MG SL tablet  Commonly known as:  NITROSTAT  Place 1 tablet (0.4 mg total) under the tongue every 5 (five) minutes as needed for chest pain.     ONE TOUCH ULTRA TEST test strip  Generic drug:  glucose blood  USE AS DIRECTED     pantoprazole 40 MG tablet  Commonly known as:  PROTONIX  TAKE 1 TABLET EVERY MORNING     zolpidem 10 MG tablet  Commonly known as:  AMBIEN  Take 1 tablet (10 mg total) by mouth at bedtime as needed. For sleep           Objective:   Physical Exam BP 128/64 mmHg  Pulse 87  Temp(Src) 97.9 F (36.6 C) (Oral)  Wt 209 lb 4 oz (94.915 kg)  SpO2 92%  General -- alert, well-developed, NAD.  Lungs -- normal respiratory effort, no intercostal retractions, no accessory muscle use, and decreased  breath sounds.  Heart-- seems regular , no murmur.   Extremities-- no pretibial edema bilaterally  Neurologic--  alert & oriented X3. Speech normal, gait appropriate for age, strength symmetric and appropriate for age.  Psych-- Cognition and judgment appear intact. Cooperative with normal attention span and concentration. No anxious or depressed appearing.     Assessment & Plan:

## 2014-10-01 ENCOUNTER — Other Ambulatory Visit (INDEPENDENT_AMBULATORY_CARE_PROVIDER_SITE_OTHER): Payer: Medicare Other

## 2014-10-01 DIAGNOSIS — E119 Type 2 diabetes mellitus without complications: Secondary | ICD-10-CM

## 2014-10-01 DIAGNOSIS — E785 Hyperlipidemia, unspecified: Secondary | ICD-10-CM

## 2014-10-01 DIAGNOSIS — I5042 Chronic combined systolic (congestive) and diastolic (congestive) heart failure: Secondary | ICD-10-CM

## 2014-10-01 LAB — BASIC METABOLIC PANEL
BUN: 10 mg/dL (ref 6–23)
CO2: 28 mEq/L (ref 19–32)
Calcium: 8.7 mg/dL (ref 8.4–10.5)
Chloride: 102 mEq/L (ref 96–112)
Creatinine, Ser: 0.8 mg/dL (ref 0.4–1.5)
GFR: 100.54 mL/min (ref >60.00)
Glucose, Bld: 145 mg/dL — ABNORMAL HIGH (ref 70–99)
Potassium: 4.2 mEq/L (ref 3.5–5.1)
Sodium: 137 mEq/L (ref 135–145)

## 2014-10-01 LAB — LIPID PANEL
Cholesterol: 136 mg/dL (ref 0–200)
HDL: 36.6 mg/dL — ABNORMAL LOW (ref >39.00)
LDL Cholesterol: 77 mg/dL (ref 0–99)
NonHDL: 99.4
Total CHOL/HDL Ratio: 4
Triglycerides: 113 mg/dL (ref 0.0–149.0)
VLDL: 22.6 mg/dL (ref 0.0–40.0)

## 2014-10-01 LAB — HEMOGLOBIN A1C: Hgb A1c MFr Bld: 7.4 % — ABNORMAL HIGH (ref 4.6–6.5)

## 2014-10-07 ENCOUNTER — Other Ambulatory Visit: Payer: Self-pay

## 2014-10-07 ENCOUNTER — Telehealth: Payer: Self-pay | Admitting: Internal Medicine

## 2014-10-07 MED ORDER — GLYBURIDE 5 MG PO TABS: 2.5000 mg | ORAL_TABLET | Freq: Every day | ORAL | Status: DC

## 2014-10-07 NOTE — Telephone Encounter (Signed)
Called and spoke with Pt, he had a question regarding Glyburide. He was unsure if he should take 1/2 tablet twice daily. Informed him to just take 1/2 tablet daily. Pt verbalized understanding.

## 2014-10-07 NOTE — Telephone Encounter (Signed)
Wants to talk to the nurse about his meds

## 2014-10-12 ENCOUNTER — Other Ambulatory Visit: Payer: Self-pay | Admitting: Internal Medicine

## 2014-10-15 ENCOUNTER — Encounter: Payer: Self-pay | Admitting: Internal Medicine

## 2014-10-15 ENCOUNTER — Ambulatory Visit (INDEPENDENT_AMBULATORY_CARE_PROVIDER_SITE_OTHER): Payer: Medicare Other | Admitting: Internal Medicine

## 2014-10-15 VITALS — BP 122/68 | HR 90 | Ht 69.5 in | Wt 212.0 lb

## 2014-10-15 DIAGNOSIS — I4891 Unspecified atrial fibrillation: Secondary | ICD-10-CM

## 2014-10-15 DIAGNOSIS — I5022 Chronic systolic (congestive) heart failure: Secondary | ICD-10-CM

## 2014-10-15 DIAGNOSIS — I10 Essential (primary) hypertension: Secondary | ICD-10-CM

## 2014-10-15 DIAGNOSIS — Z950 Presence of cardiac pacemaker: Secondary | ICD-10-CM

## 2014-10-15 DIAGNOSIS — I5042 Chronic combined systolic (congestive) and diastolic (congestive) heart failure: Secondary | ICD-10-CM

## 2014-10-15 LAB — MDC_IDC_ENUM_SESS_TYPE_INCLINIC
Battery Remaining Longevity: 70 mo
Battery Voltage: 3.01 V
Brady Statistic AP VP Percent: 0 %
Brady Statistic AP VS Percent: 0 %
Brady Statistic AS VP Percent: 95.4 %
Brady Statistic AS VS Percent: 4.6 %
Brady Statistic RA Percent Paced: 0 %
Brady Statistic RV Percent Paced: 95.4 %
Date Time Interrogation Session: 20160105111759
Lead Channel Impedance Value: 304 Ohm
Lead Channel Impedance Value: 4047 Ohm
Lead Channel Impedance Value: 4047 Ohm
Lead Channel Impedance Value: 418 Ohm
Lead Channel Impedance Value: 456 Ohm
Lead Channel Impedance Value: 475 Ohm
Lead Channel Impedance Value: 551 Ohm
Lead Channel Impedance Value: 589 Ohm
Lead Channel Impedance Value: 627 Ohm
Lead Channel Pacing Threshold Amplitude: 0.625 V
Lead Channel Pacing Threshold Amplitude: 1 V
Lead Channel Pacing Threshold Pulse Width: 0.4 ms
Lead Channel Pacing Threshold Pulse Width: 0.4 ms
Lead Channel Setting Pacing Amplitude: 2.5 V
Lead Channel Setting Pacing Amplitude: 2.5 V
Lead Channel Setting Pacing Pulse Width: 0.4 ms
Lead Channel Setting Pacing Pulse Width: 0.4 ms
Lead Channel Setting Sensing Sensitivity: 4 mV
Zone Setting Detection Interval: 350 ms
Zone Setting Detection Interval: 400 ms

## 2014-10-15 MED ORDER — FUROSEMIDE 40 MG PO TABS: ORAL_TABLET | ORAL | Status: DC

## 2014-10-15 NOTE — Assessment & Plan Note (Signed)
His medtronic BiV PM is working normally. Will recheck in several months. 

## 2014-10-15 NOTE — Patient Instructions (Addendum)
Your physician wants you to follow-up in: 6 months with Dr. Court Joy will receive a reminder letter in the mail two months in advance. If you don't receive a letter, please call our office to schedule the follow-up appointment.  Remote monitoring is used to monitor your Pacemaker or ICD from home. This monitoring reduces the number of office visits required to check your device to one time per year. It allows [place] to keep an eye on the functioning of your device to ensure it is working properly. You are scheduled for a device check from home on 01/14/15. You may send your transmission at any time that day. If you have a wireless device, the transmission will be sent automatically. After your physician reviews your transmission, you will receive a postcard with your next transmission date.  Increase your Furosemide for the next 4 days to 2 tablets by mouth twice daily .  Go back to 1 1/2 tablets by mouth twice daily after 4 days

## 2014-10-15 NOTE — Progress Notes (Signed)
HPI Bradley [name] returns today for followup. He is a 74 year old man with chronic atrial fibrillation, nonischemic cardiomyopathy, status post AV node ablation and biventricular pacemaker insertion. In the interim, the patient has done better. His heart failure symptoms are well compensated. He notes that his weight is controlled. He does note some dietary indiscretion with sodium and has also had 2 nose bleeds in the past few months.  Allergies  Allergen Reactions  . Hydrocodone Other (See Comments)    "given to him in the hospital; went thru withdrawals once home; dr said not to take it again" (09/27/2012)  . Tramadol Other (See Comments)    "given to him in the hospital; went thru withdrawals once home; dr said not to take it again" (09/27/2012)  . Tadalafil Other (See Comments)    headache, backache  . Avelox [Moxifloxacin Hydrochloride] Itching and Other (See Comments)    Headache     Current Outpatient Prescriptions  Medication Sig Dispense Refill  . ADVICOR 1000-20 MG 24 hr tablet TAKE 1 TABLET AT BEDTIME (Patient taking differently: TAKE 1 TABLET BY MOUTH AT BEDTIME) 90 tablet 1  . albuterol (PROVENTIL HFA;VENTOLIN HFA) 108 (90 BASE) MCG/ACT inhaler Inhale 2 puffs into the lungs every 4 (four) hours as needed for wheezing or shortness of breath. 1 Inhaler 0  . amLODipine (NORVASC) 10 MG tablet TAKE 1 TABLET DAILY (Patient taking differently: TAKE 1 TABLET BY MOUTH DAILY) 90 tablet 1  . aspirin 81 MG tablet Take 81 mg by mouth daily.    Marland Kitchen atorvastatin (LIPITOR) 40 MG tablet Take 1 tablet (40 mg total) by mouth daily. 90 tablet 1  . carvedilol (COREG) 25 MG tablet TAKE ONE AND ONE-HALF TABLETS TWICE A DAY WITH MEALS (Patient taking differently: TAKE ONE AND ONE-HALF TABLETS BY MOUTH TWICE A DAY WITH MEALS) 270 tablet 1  . furosemide (LASIX) 40 MG tablet TAKE ONE AND ONE-HALF TABLETS (60 MG) TWICE A DAY (Patient taking differently: TAKE ONE AND ONE-HALF TABLETS (60 MG) BY MOUTH TWICE A DAY)  270 tablet 1  . glyBURIDE (DIABETA) 5 MG tablet Take 0.5 tablets (2.5 mg total) by mouth daily with breakfast.    . JANUVIA 100 MG tablet TAKE 1 TABLET DAILY (Patient taking differently: TAKE 1 TABLET BY MOUTH DAILY) 90 tablet 2  . losartan (COZAAR) 100 MG tablet TAKE 1 TABLET DAILY (Patient taking differently: TAKE 1 TABLET BY MOUTH DAILY) 90 tablet 1  . Multiple Vitamins-Minerals (MULTIVITAMINS THER. W/MINERALS) TABS tablet Take 1 tablet by mouth daily. 30 each 5  . nitroGLYCERIN (NITROSTAT) 0.4 MG SL tablet Place 1 tablet (0.4 mg total) under the tongue every 5 (five) minutes as needed for chest pain. 20 tablet 3  . ONE TOUCH ULTRA TEST test strip USE AS DIRECTED 3 each 5  . pantoprazole (PROTONIX) 40 MG tablet TAKE 1 TABLET EVERY MORNING (Patient taking differently: TAKE 1 TABLET BY MOUTH EVERY MORNING) 90 tablet 2  . zolpidem (AMBIEN) 10 MG tablet Take 1 tablet (10 mg total) by mouth at bedtime as needed. For sleep 90 tablet 0   No current facility-administered medications for this visit.     Past Medical History  Diagnosis Date  . HTN (hypertension)   . HLD (hyperlipidemia)   . BPH (benign prostatic hyperplasia)     Saw Dr [name] 2004, normal renal u/s  . Pleural effusion 2008    S/p decortication  . Insomnia   . Atrial fibrillation     s/p AV node ablation &  BiV PPM implantation 09/08/11 (op dictation pending)  . Pulmonary HTN     per cath 2008  . Peripheral vascular disease     ??  . Anemia     intermittent  . Pacemaker   . Heart murmur   . Type II diabetes mellitus 1999  . On home oxygen therapy     at night as needed  . 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 CAD (coronary artery disease)   . Abnormal CT scan, chest 2012    CT chest, several lymphadenopathies. Sees pulmonary  . CHF (congestive heart failure)     Thought primarily to be non-systolic although EF down (EF 22-48% 12/2010, down to 35-40% 09/05/11), cath 2008 with no CAD, nuclear  study 07/2011 showing Small area of reversibility in the distal ant/lat wall the left ventricle suspicious for ischemia/septal wall HK but felt to be low risk  (per D/C Summary 07/2011)  . Chronic bronchitis     "get it ~ q yr"  . Migraines     "very very rare"  . Arthritis     "?back" (08/22/2014)    ROS:   All systems reviewed and negative except as noted in the HPI.   Past Surgical History  Procedure Laterality Date  . Pleural scarification    . Lung decortication    . Colonoscopy  03/10/11    normal  . Pneumothorax with fibrothorax  ~ 2010  . Insert / replace / remove pacemaker  09/08/11    pacemaker placement  . Tonsillectomy      "as a kid" (09/27/2012)  . Cardiac catheterization      "couple times; never had balloon or stent" (09/27/2012)  . Bi-ventricular pacemaker insertion Left 09/08/2011    Procedure: BI-VENTRICULAR PACEMAKER INSERTION (CRT-P);  Surgeon: Marinus Maw, MD;  Location: [facility] CATH LAB;  Service: Cardiovascular;  Laterality: Left;     Family History  Problem Relation Age of Onset  . Diabetes Father   . Breast cancer Maternal Aunt   . Prostate cancer Neg Hx   . Colon cancer Neg Hx   . Coronary artery disease Father   . Polycythemia Mother      History   Social History  . Marital Status: Married    Spouse Name: [place]    Number of Children: 1  . 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 Smoking status: Former Smoker -- 2.50 packs/day for 25 years    Types: Cigarettes    Quit date: 10/11/1980  . Smokeless tobacco: Never Used     Comment: used to smoke 2.5 ppd for 25 years  . Alcohol Use: Yes     Comment: 08/22/2014 "maybe q couple months I'll have a beer w/dinner"  . Drug Use: No  . Sexual Activity: Not Currently   Other Topics Concern  . Not on file   Social History Narrative   Moved from [place] 2003.[place]   He lives w/ his wife               BP 122/68 mmHg   Pulse 90  Ht 5' 9.5" (1.765 m)  Wt 212 lb (96.163 kg)  BMI 30.87 kg/m2  Physical Exam:  Well appearing 74 year old man,NAD HEENT: Unremarkable Neck:  8 cmJVD, no thyromegally Lungs:  Clear with no rales, or rhonchi. Scattered wheezes HEART:  Regular rate rhythm, no murmurs, no rubs,  no clicks Abd:  soft, positive bowel sounds, no organomegally, no rebound, no guarding Ext:  2 plus pulses, no edema, no cyanosis, no clubbing Skin:  No rashes no nodules Neuro:  CN II through XII intact, motor grossly intact   DEVICE  Normal device function.  See PaceArt for details. Optivol is up.  Assess/Plan:

## 2014-10-15 NOTE — Assessment & Plan Note (Signed)
His symptoms are fairly well controlled. His fluid index is elevated. Will increase his lasix for 4 days to 80 bid.

## 2014-10-15 NOTE — Assessment & Plan Note (Signed)
His ventricular rate is well controlled. He will continue his current meds. He is not an anti-coagulation candidate due to a h/o bleeding.

## 2014-10-15 NOTE — Assessment & Plan Note (Signed)
His blood pressure is well controlled. I have asked him to reduce his sodium intake.

## 2014-10-17 ENCOUNTER — Other Ambulatory Visit: Payer: Self-pay | Admitting: Internal Medicine

## 2014-10-17 DIAGNOSIS — J209 Acute bronchitis, unspecified: Secondary | ICD-10-CM

## 2014-10-18 ENCOUNTER — Ambulatory Visit (INDEPENDENT_AMBULATORY_CARE_PROVIDER_SITE_OTHER): Payer: Medicare Other | Admitting: Internal Medicine

## 2014-10-18 ENCOUNTER — Ambulatory Visit: Payer: Medicare Other | Admitting: Internal Medicine

## 2014-10-18 ENCOUNTER — Encounter: Payer: Self-pay | Admitting: Internal Medicine

## 2014-10-18 VITALS — BP 120/74 | HR 80 | Ht 68.0 in | Wt 211.0 lb

## 2014-10-18 DIAGNOSIS — J9611 Chronic respiratory failure with hypoxia: Secondary | ICD-10-CM

## 2014-10-18 DIAGNOSIS — J209 Acute bronchitis, unspecified: Secondary | ICD-10-CM

## 2014-10-18 LAB — PULMONARY FUNCTION TEST
DL/VA % pred: 85 %
DL/VA: 3.83 ml/min/mmHg/L
DLCO unc % pred: 53 %
DLCO unc: 15.84 ml/min/mmHg
FEF 25-75 Post: 0.87 L/sec
FEF 25-75 Pre: 0.99 L/sec
FEF2575-%Change-Post: -11 %
FEF2575-%Pred-Post: 41 %
FEF2575-%Pred-Pre: 46 %
FEV1-%Change-Post: -1 %
FEV1-%Pred-Post: 58 %
FEV1-%Pred-Pre: 59 %
FEV1-Post: 1.68 L
FEV1-Pre: 1.71 L
FEV1FVC-%Change-Post: 5 %
FEV1FVC-%Pred-Pre: 93 %
FEV6-%Change-Post: -6 %
FEV6-%Pred-Post: 61 %
FEV6-%Pred-Pre: 65 %
FEV6-Post: 2.27 L
FEV6-Pre: 2.43 L
FEV6FVC-%Change-Post: 2 %
FEV6FVC-%Pred-Post: 106 %
FEV6FVC-%Pred-Pre: 103 %
FVC-%Change-Post: -6 %
FVC-%Pred-Post: 58 %
FVC-%Pred-Pre: 63 %
FVC-Post: 2.32 L
FVC-Pre: 2.49 L
Post FEV1/FVC ratio: 72 %
Post FEV6/FVC ratio: 100 %
Pre FEV1/FVC ratio: 68 %
Pre FEV6/FVC Ratio: 97 %
RV % pred: 107 %
RV: 2.59 L
TLC % pred: 75 %
TLC: 5.03 L

## 2014-10-18 NOTE — Progress Notes (Signed)
Subjective:    Patient ID: Bradley SearlesJohn J [name], male    DOB: 03/29/1941     MRN: [id number]   Brief patient profile:  [age] yowm quit smoking in 1982 prev pulm  eval in 2008 with pleural effusions but all better with no resp issues until Aug 2012 when dx with bronchitis and "downhill since" so referred by Dr Drue [name] back to pulmonary clinic in Nov 2012 and ultimately proven to have no sign copd by pft's 10/28/2011 and sob attributed to chf.   HPI 08/27/2011 1st pulmonary eval in emr era cc new onset cough indolent persistent, daily x 3 months,  typically p supper with minimal yellow mucus  Assoc with new sob x sev flights and stop at top of 3 rd -  50% better better p rx with pna no better with albuterol.  No overt sinus or reflux symptoms rec Prednisone 10 mg take  4 each am x 2 days,   2 each am x 2 days,  1 each am x2days and stop  Change lopressor to 50mg  one half in am and one half in pm Protonix 30-60 min before bfast and Pepcid 20mg  one at bedtime  GERD diet   Admit date: 09/20/2011  Discharge date: 09/23/2011  Discharge Diagnoses  Systolic CHF, acute on chronic  Permanent atrial fibrillation  Hypokalemia  Lymphadenopathy  09/24/2011 Bradley [name] cc Hospital follow-up. Was hospitalized for trouble breathing. Patient states he is better since getting out of the hospital.  rec Only use your albuterol (proventil or ventolin)  as a rescue medication to be used if you can'[name] catch your breath > not using  Keep on 02 24 hours per day @ 2lpm     10/28/2011 f/u ov/Bradley [name] cc Shortness of Breath Improved since last visit on 02 and with rx of chf, no cough or daytime need for saba - on 02 can walk flat and slow as much as desired rec Stay on 02 24 hours per day - this will help your heart You do not have significant lung problems other than what is related to the congestive heart failure so pulmonary follow up will be as needed     Date of Admission: 08/22/2014 Date of Discharge: 08/25/2014  Attending  Physician:Bradley [name],Bradley [name]  Patient's WUJ:[id number]:Bradley [name], MD  Consults: PCCM  Disposition: D/C home   Discharge Diagnoses: Acute on chronic resp failure with hypoxia SSCP Hx of COPD  Chronic combined diastolic and systolic CHF  Hx afib s/p ablation w/ pacer Nov 2012 HTN HLD DM2  Initial presentation: 74 y.o. male with a hx of HTN, HLD, and systolic and diastolic chf who presented from outpatient clinic after failing tx for bronchitis with doxycycline. Pt reportedly progressively became more dyspneic and sob. A cxr was found to be without evidence of PNA or pulm edema. He was noted to have O2 sats in the mid-80's on O2. The patient was then directly admitted to stepdown.   Hospital Course:  Acute on chronic resp failure with hypoxia Felt to be due to AECOPD - tapered steroids to oral pred from IV solumedrol - weaned O2 to home dose of 2L w/ sats 90% or > - cleared for d/c home w/ completion of steroid taper and oral abx tx - spiriva and albuterol per PCCM recs  SSCP Likely situational stress related (family argument), but pt high risk for CAD - EKGs unrevealing - cycled enzymes all negative - sx resolved at time of d/c   Hx of COPD  Has not  smoked in 30 years, but was a "very heavy" smoker - on 2LPM O2 at all times at home   Chronic combined diastolic and systolic CHF  EF 16-60% via TTE Sept 2013 - repeat TTE notes improvement in EF - no clinical signs of CHF exac  Hx afib s/p ablation w/ pacer Nov 2012 Followed by Bradley [name] EP  HTN BP well controlled at present   HLD Resume advicor   DM2 CBG uncontrolled in setting of steroid use - taper steroids quickly and resume usual tx at home post d/c   09/06/2014 post hosp f/u/ transition of care ov / re-establish care / /Bradley [name] / newly started on spiriva despite nl baseline pfts  Chief Complaint  Patient presents with  . Hospitalization Follow-up    Feeling well since leaving the hospital. Reports coughing from time to time,  when he does it came be dry but at times it can be productive. Denies chest tightness, SOB or wheezing.  breathing much better now= back to baseline, ok x 3 flights/ uses 2lpm when walking dog and sometimes shopping    At baseline before deterioration rarely needed any albuterol at all ? How compliant he was with prev 02 recs Main c/o is day > noct cough dry > wet still present and at times quite severe  rec Stop spiriva as you have no copd  Only use your albuterol (proair)  Pantoprazole (protonix) 40 mg   Take 30-60 min before first meal of the day and Pepcid ac  20 mg one bedtime until return to office - this is the best way to tell whether stomach acid is contributing to your problem.  GERD diet    10/18/2014 f/u ov/Bradley [name] re:  Mild intermittent asthma  Chief Complaint  Patient presents with  . Follow-up    PFT done today. Breathing is "excellent".  No co's today.       no need for any gerd rx or inhalers x one month    No obvious day to day or daytime variabilty or assoc chronic cough or cp or chest tightness, subjective wheeze overt sinus or hb symptoms. No unusual exp hx or h/o childhood pna/ asthma or knowledge of premature birth.  Sleeping ok without nocturnal  or early am exacerbation  of respiratory  c/o's or need for noct saba. Also denies any obvious fluctuation of symptoms with weather or environmental changes or other aggravating or alleviating factors except as outlined above   Current Medications, Allergies, Complete Past Medical History, Past Surgical History, Family History, and Social History were reviewed in [employer] record.  ROS  The following are not active complaints unless bolded sore throat, dysphagia, dental problems, itching, sneezing,  nasal congestion or excess/ purulent secretions, ear ache,   fever, chills, sweats, unintended wt loss, pleuritic or exertional cp, hemoptysis,  orthopnea pnd or leg swelling, presyncope, palpitations,  heartburn, abdominal pain, anorexia, nausea, vomiting, diarrhea  or change in bowel or urinary habits, change in stools or urine, dysuria,hematuria,  rash, arthralgias, visual complaints, headache, numbness weakness or ataxia or problems with walking or coordination,  change in mood/affect or memory.                   Objective:   Physical Exam  Edentulous wm nad   08/27/2011   217 > 09/24/2011  210 > 217 10/28/2011 > 09/06/2014  214>   10/18/2014 211   HEENT: nl dentition, turbinates, and orophanx. Nl external ear canals without cough  reflex   NECK :  without JVD/Nodes/TM/ nl carotid upstrokes bilaterally   LUNGS: no acc muscle use, clear to A and P bilaterally without cough on insp or exp maneuvers   CV:  RRR  no s3 or murmur or increase in P2, no edema   ABD:  soft and nontender with nl excursion in the supine position. No bruits or organomegaly, bowel sounds nl  MS:  warm without deformities, calf tenderness, cyanosis or clubbing  SKIN: warm and dry without lesions    NEURO:  alert, approp, no deficits         cxr 08/22/14  There has not been significant interval change in the appearance of the chest since the previous study. Chronic bronchitic changes are present. The chronic prominence of the pulmonary vascularity centrally is consistent with the history of pulmonary hypertension. There is no evidence of acute CHF nor of pneumonia.       Assessment & Plan:

## 2014-10-18 NOTE — Assessment & Plan Note (Signed)
-   started on 02 2lpm at discharge 09/23/11   - PFT's 10/28/2011  FEV1  1.40 (51%)  with ratio 70 and no better p B2 and DLCO 53 corrects to 101    - PFT's 10/18/2014    FEV1 1.71 (59%) with ratio 68 and no sign change p B2 and DLCO 53 corrects to 85    All he needs at this point is noct 02, does not have enough copd or asthma to warrant anything more than noct 02

## 2014-10-18 NOTE — Patient Instructions (Signed)
In the event of a recurrent cough please Try prilosec 20mg   Take 30-60 min before first meal of the day and Pepcid 20 mg one bedtime until cough is completely gone   Pulmonary follow up is as needed

## 2014-10-18 NOTE — Progress Notes (Signed)
PFT done today. 

## 2014-10-18 NOTE — Assessment & Plan Note (Signed)
I had an extended discussion with the patient and wife reviewing all relevant studies completed to date and  lasting 15 to 20 minutes of a 25 minute visit on the following ongoing concerns:   Explained the natural history of uri and why it's necessary in patients at risk to treat GERD aggressively - at least  short term -   to reduce risk of evolving cyclical cough initially  triggered by epithelial injury and a heightened sensitivty to the effects of any upper airway irritants,  most importantly acid - related - then perpetuated by epithelial injury related to the cough itself as the upper airway collapses on itself.  That is, the more sensitive the epithelium becomes once it is damaged by the virus, the more the ensuing irritability> the more the cough, the more the secondary reflux (especially in those prone to reflux) the more the irritation of the sensitive mucosa and so on in a  Classic cyclical pattern.    For now though does not appear to need gerd rx or for that matter any rx but 02 hs - it appears he tends to self titrate maint rx so the simpler we keep things for him the better

## 2014-10-21 ENCOUNTER — Telehealth: Payer: Self-pay

## 2014-10-21 ENCOUNTER — Other Ambulatory Visit: Payer: Self-pay | Admitting: Internal Medicine

## 2014-10-21 MED ORDER — ZOLPIDEM TARTRATE 10 MG PO TABS: 10.0000 mg | ORAL_TABLET | Freq: Every evening | ORAL | Status: DC | PRN

## 2014-10-21 NOTE — Telephone Encounter (Signed)
Rx printed awaiting signature by [name].

## 2014-10-21 NOTE — Telephone Encounter (Signed)
Faxed to [facility].

## 2014-10-21 NOTE — Telephone Encounter (Signed)
Pt is requesting refill on Ambien.  Last OV: 09/30/2014  Last Fill: 09/14/2013 # [id number]  Please advise.

## 2014-10-21 NOTE — Telephone Encounter (Signed)
Okay #90, no refills 

## 2014-10-27 ENCOUNTER — Other Ambulatory Visit: Payer: Self-pay | Admitting: Internal Medicine

## 2014-11-01 ENCOUNTER — Other Ambulatory Visit: Payer: Self-pay | Admitting: Internal Medicine

## 2014-11-08 ENCOUNTER — Telehealth: Payer: Self-pay

## 2014-11-08 ENCOUNTER — Other Ambulatory Visit: Payer: Self-pay

## 2014-11-08 MED ORDER — NIACIN ER (ANTIHYPERLIPIDEMIC) 1000 MG PO TBCR: 1000.0000 mg | EXTENDED_RELEASE_TABLET | Freq: Every day | ORAL | Status: DC

## 2014-11-08 MED ORDER — LOVASTATIN 20 MG PO TABS: 20.0000 mg | ORAL_TABLET | Freq: Every day | ORAL | Status: DC

## 2014-11-08 NOTE — Telephone Encounter (Signed)
Received fax from [facility] regarding Pts Advicor 1000/20 medication that is no longer being manufactured. Chart reviewed by [name]; Advicor 1000/20 d/c, and changed to Lovastatin 20 mg 1 tablet daily, and Niacin 1000 mg 1 tablet daily and sent to [facility].

## 2014-11-08 NOTE — Telephone Encounter (Signed)
Closed in error.

## 2014-11-11 NOTE — Telephone Encounter (Signed)
Letter printed and mailed to Pt informing him that Advicor 1000/20 is no longer being manufactured. Medication changed to Lovastatin 20 mg 1 tablet daily and Niacin 1000 mg 1 tablet daily, medication sent to [facility].

## 2014-11-18 ENCOUNTER — Encounter: Payer: Self-pay | Admitting: Internal Medicine

## 2014-11-18 ENCOUNTER — Ambulatory Visit (INDEPENDENT_AMBULATORY_CARE_PROVIDER_SITE_OTHER): Payer: Medicare Other | Admitting: Internal Medicine

## 2014-11-18 VITALS — BP 128/64 | HR 78 | Temp 98.3°F | Ht 68.0 in | Wt 211.4 lb

## 2014-11-18 DIAGNOSIS — E785 Hyperlipidemia, unspecified: Secondary | ICD-10-CM

## 2014-11-18 DIAGNOSIS — E119 Type 2 diabetes mellitus without complications: Secondary | ICD-10-CM

## 2014-11-18 NOTE — Progress Notes (Signed)
Pre visit review using our clinic review tool, if applicable. No additional management support is needed unless otherwise documented below in the visit note. 

## 2014-11-18 NOTE — Progress Notes (Signed)
Subjective:    Patient ID: Bradley [name], male    DOB: Dec 22, 1940, 74 y.o.   MRN: [id number]  DOS:  11/18/2014 Type of visit - description : rov Interval history: Diabetes, good compliance with medication, ambulatory CBGs 90, 100. Denies symptoms consistent with low blood sugar Chronic respiratory failure, saw [name], note reviewed, emphasis on GERD control was discussed. Hyperlipidemia, good compliance with medications.    Review of Systems His chest pain or difficulty breathing No nausea, vomiting, diarrhea. Currently without cough or sputum production  Past Medical History  Diagnosis Date  . HTN (hypertension)   . HLD (hyperlipidemia)   . BPH (benign prostatic hyperplasia)     Saw Dr [name] 2004, normal renal u/s  . Pleural effusion 2008    S/p decortication  . Insomnia   . Atrial fibrillation     s/p AV node ablation & BiV PPM implantation 09/08/11 (op dictation pending)  . Pulmonary HTN     per cath 2008  . Peripheral vascular disease     ??  . Anemia     intermittent  . Pacemaker   . Heart murmur   . Type II diabetes mellitus 1999  . On home oxygen therapy     at night as needed  . 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 CAD (coronary artery disease)   . Abnormal CT scan, chest 2012    CT chest, several lymphadenopathies. Sees pulmonary  . CHF (congestive heart failure)     Thought primarily to be non-systolic although EF down (EF 16-60% 12/2010, down to 35-40% 09/05/11), cath 2008 with no CAD, nuclear study 07/2011 showing Small area of reversibility in the distal ant/lat wall the left ventricle suspicious for ischemia/septal wall HK but felt to be low risk  (per D/C Summary 07/2011)  . Chronic bronchitis     "get it ~ q yr"  . Migraines     "very very rare"  . Arthritis     "?back" (08/22/2014)    Past Surgical History  Procedure Laterality Date  . Pleural scarification    . Lung decortication    . Colonoscopy  03/10/11   normal  . Pneumothorax with fibrothorax  ~ 2010  . Insert / replace / remove pacemaker  09/08/11    pacemaker placement  . Tonsillectomy      "as a kid" (09/27/2012)  . Cardiac catheterization      "couple times; never had balloon or stent" (09/27/2012)  . Bi-ventricular pacemaker insertion Left 09/08/2011    Procedure: BI-VENTRICULAR PACEMAKER INSERTION (CRT-P);  Surgeon: Marinus Maw, MD;  Location: [facility] CATH LAB;  Service: Cardiovascular;  Laterality: Left;    History   Social History  . Marital Status: Married    Spouse Name: [place]    Number of Children: 1  . 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 Smoking status: Former Smoker -- 2.50 packs/day for 25 years    Types: Cigarettes    Quit date: 10/11/1980  . Smokeless tobacco: Never Used     Comment: used to smoke 2.5 ppd for 25 years  . Alcohol Use: Yes     Comment: 08/22/2014 "maybe q couple months I'll have a beer w/dinner"  . Drug Use: No  . Sexual Activity: Not Currently   Other Topics Concern  . Not on file   Social History Narrative  Moved from [place] 2003.[place]   He lives w/ his wife                  Medication List       This list is accurate as of: 11/18/14  6:55 PM.  Always use your most recent med list.               albuterol 108 (90 BASE) MCG/ACT inhaler  Commonly known as:  PROVENTIL HFA;VENTOLIN HFA  Inhale 2 puffs into the lungs every 4 (four) hours as needed for wheezing or shortness of breath.     amLODipine 10 MG tablet  Commonly known as:  NORVASC  TAKE 1 TABLET DAILY     aspirin 81 MG tablet  Take 81 mg by mouth as needed.     carvedilol 25 MG tablet  Commonly known as:  COREG  TAKE ONE AND ONE-HALF TABLETS TWICE A DAY WITH MEALS     furosemide 40 MG tablet  Commonly known as:  LASIX  Take 1 1/2 tablets by mouth twice daily and as needed     glyBURIDE 5 MG tablet  Commonly known as:  DIABETA  Take 0.5  tablets (2.5 mg total) by mouth daily with breakfast.     JANUVIA 100 MG tablet  Generic drug:  sitaGLIPtin  TAKE 1 TABLET DAILY     losartan 100 MG tablet  Commonly known as:  COZAAR  TAKE 1 TABLET DAILY     lovastatin 20 MG tablet  Commonly known as:  MEVACOR  Take 1 tablet (20 mg total) by mouth at bedtime.     multivitamins ther. w/minerals Tabs tablet  Take 1 tablet by mouth daily.     niacin 1000 MG CR tablet  Commonly known as:  NIASPAN  Take 1 tablet (1,000 mg total) by mouth at bedtime.     nitroGLYCERIN 0.4 MG SL tablet  Commonly known as:  NITROSTAT  Place 1 tablet (0.4 mg total) under the tongue every 5 (five) minutes as needed for chest pain.     ONE TOUCH ULTRA TEST test strip  Generic drug:  glucose blood  USE AS DIRECTED     pantoprazole 40 MG tablet  Commonly known as:  PROTONIX  TAKE 1 TABLET EVERY MORNING     zolpidem 10 MG tablet  Commonly known as:  AMBIEN  Take 1 tablet (10 mg total) by mouth at bedtime as needed. For sleep           Objective:   Physical Exam  Constitutional: He is oriented to person, place, and time. He appears well-developed. No distress.  HENT:  Head: Normocephalic and atraumatic.  Cardiovascular:  RRR, no murmur, rub or gallop  Pulmonary/Chest: Effort normal. No respiratory distress.  CTA B  Musculoskeletal: Normal range of motion. He exhibits no edema or tenderness.  Neurological: He is alert and oriented to person, place, and time. No cranial nerve deficit. He exhibits normal muscle tone. Coordination normal.  Speech normal, gait unassisted and normal for age, motor strength appropriate for age   Skin: Skin is warm and dry. No pallor.  Psychiatric: He has a normal mood and affect. His behavior is normal. Judgment and thought content normal.  Vitals reviewed.      Assessment & Plan:   Problem List Items Addressed This Visit      Endocrine   DM II (diabetes mellitus, type II), controlled - Primary    Based on  the last A1c we  added glyburide, good compliance, no apparent side effects. Wife is concerned because the patient sometimes does not eat a regular meal, if that is the case I recommend him to skip glyburide.        Other   Hyperlipidemia    Good compliance with medications, apparent side effects

## 2014-11-18 NOTE — Assessment & Plan Note (Addendum)
Good compliance with medications, no apparent side effects

## 2014-11-18 NOTE — Assessment & Plan Note (Signed)
Based on the last A1c we added glyburide, good compliance, no apparent side effects. Wife is concerned because the patient sometimes does not eat a regular meal, if that is the case I recommend him to skip glyburide.

## 2014-11-18 NOTE — Patient Instructions (Signed)
  Please come back to the office by 01-2015 for a physical exam. Come back fasting    

## 2014-12-10 ENCOUNTER — Other Ambulatory Visit: Payer: Self-pay | Admitting: Internal Medicine

## 2014-12-30 ENCOUNTER — Ambulatory Visit: Payer: Medicare Other | Admitting: Internal Medicine

## 2015-01-21 ENCOUNTER — Other Ambulatory Visit: Payer: Self-pay | Admitting: Internal Medicine

## 2015-02-19 ENCOUNTER — Telehealth: Payer: Self-pay | Admitting: Internal Medicine

## 2015-02-19 NOTE — Telephone Encounter (Signed)
Pre Visit letter sent  °

## 2015-02-24 ENCOUNTER — Telehealth: Payer: Self-pay | Admitting: Internal Medicine

## 2015-02-24 ENCOUNTER — Ambulatory Visit (INDEPENDENT_AMBULATORY_CARE_PROVIDER_SITE_OTHER): Payer: Medicare Other | Admitting: *Deleted

## 2015-02-24 DIAGNOSIS — I495 Sick sinus syndrome: Secondary | ICD-10-CM | POA: Diagnosis not present

## 2015-02-24 NOTE — Telephone Encounter (Signed)
New message      Calling to see if we received his remote transmission

## 2015-02-24 NOTE — Telephone Encounter (Signed)
Spoke w/ pt and had him call tech services to receive further help w/ trouble shooting monitor.

## 2015-02-24 NOTE — Telephone Encounter (Signed)
Spoke w/ pt and informed him that transmission was not received. Helped pt trouble shoot monitor. He is going to attempt another transmission and call back once completed.

## 2015-02-24 NOTE — Progress Notes (Signed)
Remote pacemaker transmission.   

## 2015-02-27 LAB — CUP PACEART REMOTE DEVICE CHECK
Battery Remaining Longevity: 64 mo
Battery Voltage: 3.01 V
Brady Statistic AP VP Percent: 0 %
Brady Statistic AP VS Percent: 0 %
Brady Statistic AS VP Percent: 94.51 %
Brady Statistic AS VS Percent: 5.49 %
Brady Statistic RA Percent Paced: 0 %
Brady Statistic RV Percent Paced: 94.51 %
Date Time Interrogation Session: 20160516211217
Lead Channel Impedance Value: 323 Ohm
Lead Channel Impedance Value: 4047 Ohm
Lead Channel Impedance Value: 4047 Ohm
Lead Channel Impedance Value: 475 Ohm
Lead Channel Impedance Value: 475 Ohm
Lead Channel Impedance Value: 513 Ohm
Lead Channel Impedance Value: 608 Ohm
Lead Channel Impedance Value: 627 Ohm
Lead Channel Impedance Value: 646 Ohm
Lead Channel Pacing Threshold Amplitude: 0.625 V
Lead Channel Pacing Threshold Amplitude: 0.875 V
Lead Channel Pacing Threshold Pulse Width: 0.4 ms
Lead Channel Pacing Threshold Pulse Width: 0.4 ms
Lead Channel Sensing Intrinsic Amplitude: 10.25 mV
Lead Channel Sensing Intrinsic Amplitude: 10.25 mV
Lead Channel Setting Pacing Amplitude: 2.5 V
Lead Channel Setting Pacing Amplitude: 2.5 V
Lead Channel Setting Pacing Pulse Width: 0.4 ms
Lead Channel Setting Pacing Pulse Width: 0.4 ms
Lead Channel Setting Sensing Sensitivity: 4 mV
Zone Setting Detection Interval: 350 ms
Zone Setting Detection Interval: 400 ms

## 2015-03-11 ENCOUNTER — Telehealth: Payer: Self-pay | Admitting: *Deleted

## 2015-03-11 NOTE — Telephone Encounter (Signed)
Unable to reach patient at time of Pre-Visit Call.  Left message with wife confirming patient's appointment time.

## 2015-03-12 ENCOUNTER — Encounter: Payer: Self-pay | Admitting: Cardiology

## 2015-03-12 ENCOUNTER — Ambulatory Visit (INDEPENDENT_AMBULATORY_CARE_PROVIDER_SITE_OTHER): Payer: Medicare Other | Admitting: Internal Medicine

## 2015-03-12 ENCOUNTER — Other Ambulatory Visit: Payer: Self-pay | Admitting: Internal Medicine

## 2015-03-12 ENCOUNTER — Encounter: Payer: Self-pay | Admitting: Internal Medicine

## 2015-03-12 VITALS — BP 128/84 | HR 90 | Temp 97.8°F | Ht 68.0 in | Wt 208.5 lb

## 2015-03-12 DIAGNOSIS — Z23 Encounter for immunization: Secondary | ICD-10-CM | POA: Diagnosis not present

## 2015-03-12 DIAGNOSIS — I5042 Chronic combined systolic (congestive) and diastolic (congestive) heart failure: Secondary | ICD-10-CM | POA: Diagnosis not present

## 2015-03-12 DIAGNOSIS — I10 Essential (primary) hypertension: Secondary | ICD-10-CM

## 2015-03-12 DIAGNOSIS — F419 Anxiety disorder, unspecified: Secondary | ICD-10-CM | POA: Diagnosis not present

## 2015-03-12 DIAGNOSIS — Z0000 Encounter for general adult medical examination without abnormal findings: Secondary | ICD-10-CM

## 2015-03-12 DIAGNOSIS — E785 Hyperlipidemia, unspecified: Secondary | ICD-10-CM | POA: Diagnosis not present

## 2015-03-12 DIAGNOSIS — I509 Heart failure, unspecified: Secondary | ICD-10-CM | POA: Diagnosis not present

## 2015-03-12 DIAGNOSIS — J449 Chronic obstructive pulmonary disease, unspecified: Secondary | ICD-10-CM

## 2015-03-12 DIAGNOSIS — E119 Type 2 diabetes mellitus without complications: Secondary | ICD-10-CM

## 2015-03-12 LAB — CBC WITH DIFFERENTIAL/PLATELET
Basophils Absolute: 0 10*3/uL (ref 0.0–0.1)
Basophils Relative: 0.3 % (ref 0.0–3.0)
Eosinophils Absolute: 0.1 10*3/uL (ref 0.0–0.7)
Eosinophils Relative: 1.2 % (ref 0.0–5.0)
HCT: 44.8 % (ref 39.0–52.0)
Hemoglobin: 15.3 g/dL (ref 13.0–17.0)
Lymphocytes Relative: 24.7 % (ref 12.0–46.0)
Lymphs Abs: 1.7 10*3/uL (ref 0.7–4.0)
MCHC: 34.1 g/dL (ref 30.0–36.0)
MCV: 91.2 fl (ref 78.0–100.0)
Monocytes Absolute: 0.6 10*3/uL (ref 0.1–1.0)
Monocytes Relative: 9.4 % (ref 3.0–12.0)
Neutro Abs: 4.4 10*3/uL (ref 1.4–7.7)
Neutrophils Relative %: 64.4 % (ref 43.0–77.0)
Platelets: 169 10*3/uL (ref 150.0–400.0)
RBC: 4.91 Mil/uL (ref 4.22–5.81)
RDW: 13.5 % (ref 11.5–15.5)
WBC: 6.9 10*3/uL (ref 4.0–10.5)

## 2015-03-12 LAB — BASIC METABOLIC PANEL
BUN: 17 mg/dL (ref 6–23)
CO2: 31 mEq/L (ref 19–32)
Calcium: 8.8 mg/dL (ref 8.4–10.5)
Chloride: 104 mEq/L (ref 96–112)
Creatinine, Ser: 0.97 mg/dL (ref 0.40–1.50)
GFR: 80.4 mL/min (ref >60.00)
Glucose, Bld: 94 mg/dL (ref 70–99)
Potassium: 4.1 mEq/L (ref 3.5–5.1)
Sodium: 139 mEq/L (ref 135–145)

## 2015-03-12 LAB — LIPID PANEL
Cholesterol: 153 mg/dL (ref 0–200)
HDL: 41.2 mg/dL (ref >39.00)
LDL Cholesterol: 96 mg/dL (ref 0–99)
NonHDL: 111.8
Total CHOL/HDL Ratio: 4
Triglycerides: 77 mg/dL (ref 0.0–149.0)
VLDL: 15.4 mg/dL (ref 0.0–40.0)

## 2015-03-12 LAB — HEMOGLOBIN A1C: Hgb A1c MFr Bld: 5.5 % (ref 4.6–6.5)

## 2015-03-12 MED ORDER — GLYBURIDE 5 MG PO TABS: 2.5000 mg | ORAL_TABLET | Freq: Every day | ORAL | Status: DC

## 2015-03-12 NOTE — Telephone Encounter (Signed)
Okay to refill for 6 months 

## 2015-03-12 NOTE — Progress Notes (Deleted)
Subjective:    Patient ID: Bradley [name], male    DOB: 1940/12/09, 74 y.o.   MRN: [id number]  DOS:  03/12/2015 Type of visit - description :    Here for Medicare AWV:   1. Risk factors based on Past M, S, F history: reviewed   2. Physical Activities: active, yard and home chores, takes walks sometimes   3. Depression/mood: neg screen 4. Hearing: has hearing aids , uses them irregulalrly   5. ADL's: independent  , still drives 6. Fall Risk: no recent falls,  prevention discussed  , see AVS 7. Home Safety: does feel safe at home   8. Height, weight, &visual acuity: see VS, uses glasses no problems, due to see eye doctor this year, plans to go 9. Counseling: yes   10. Labs ordered based on risk factors: yes   11. Referral Coordination, if needed   12. Care Plan, see a/p   13. Cognitive Assessment: motor, cognition, memory wnl  14. Care team updated-- Dr Ladona Ridgel, Dr wert  15. End of life care, rec a healthcare POA  in addition, we discussed the following   Review of Systems   Past Medical History  Diagnosis Date  . HTN (hypertension)   . HLD (hyperlipidemia)   . BPH (benign prostatic hyperplasia)     Saw Dr [name] 2004, normal renal u/s  . Pleural effusion 2008    S/p decortication  . Insomnia   . Atrial fibrillation     s/p AV node ablation & BiV PPM implantation 09/08/11 (op dictation pending)  . Pulmonary HTN     per cath 2008  . Peripheral vascular disease     ??  . Anemia     intermittent  . Pacemaker   . Heart murmur   . Type II diabetes mellitus 1999  . On home oxygen therapy     at night as needed  . 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 CAD (coronary artery disease)   . Abnormal CT scan, chest 2012    CT chest, several lymphadenopathies. Sees pulmonary  . CHF (congestive heart failure)     Thought primarily to be non-systolic although EF down (EF 48-18% 12/2010, down to 35-40% 09/05/11), cath 2008 with no CAD, nuclear study  07/2011 showing Small area of reversibility in the distal ant/lat wall the left ventricle suspicious for ischemia/septal wall HK but felt to be low risk  (per D/C Summary 07/2011)  . Chronic bronchitis     "get it ~ q yr"  . Migraines     "very very rare"  . Arthritis     "?back" (08/22/2014)    Past Surgical History  Procedure Laterality Date  . Pleural scarification    . Lung decortication    . Colonoscopy  03/10/11    normal  . Pneumothorax with fibrothorax  ~ 2010  . Insert / replace / remove pacemaker  09/08/11    pacemaker placement  . Tonsillectomy      "as a kid" (09/27/2012)  . Cardiac catheterization      "couple times; never had balloon or stent" (09/27/2012)  . Bi-ventricular pacemaker insertion Left 09/08/2011    Procedure: BI-VENTRICULAR PACEMAKER INSERTION (CRT-P);  Surgeon: Marinus Maw, MD;  Location: [facility] CATH LAB;  Service: Cardiovascular;  Laterality: Left;    History   Social History  . Marital Status: Married    Spouse Name: [name]  . Number of Children: 1  .  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 Smoking status: Former Smoker -- 2.50 packs/day for 25 years    Types: Cigarettes    Quit date: 10/11/1980  . Smokeless tobacco: Never Used     Comment: used to smoke 2.5 ppd for 25 years  . Alcohol Use: Yes     Comment: 08/22/2014 "maybe q couple months I'll have a beer w/dinner"  . Drug Use: No  . Sexual Activity: Not Currently   Other Topics Concern  . Not on file   Social History Narrative   Moved from [place] 2003.[place]   He lives w/ his wife                  Medication List       This list is accurate as of: 03/12/15 10:24 AM.  Always use your most recent med list.               albuterol 108 (90 BASE) MCG/ACT inhaler  Commonly known as:  PROVENTIL HFA;VENTOLIN HFA  Inhale 2 puffs into the lungs every 4 (four) hours as needed for wheezing or shortness of breath.       amLODipine 10 MG tablet  Commonly known as:  NORVASC  TAKE 1 TABLET DAILY     aspirin 81 MG tablet  Take 81 mg by mouth as needed.     carvedilol 25 MG tablet  Commonly known as:  COREG  Take 1 and 1/2 tablets twice a day with meals.     furosemide 40 MG tablet  Commonly known as:  LASIX  Take 1 1/2 tablets by mouth twice daily and as needed     glyBURIDE 5 MG tablet  Commonly known as:  DIABETA  Take 0.5 tablets (2.5 mg total) by mouth daily with breakfast.     JANUVIA 100 MG tablet  Generic drug:  sitaGLIPtin  TAKE 1 TABLET DAILY     losartan 100 MG tablet  Commonly known as:  COZAAR  TAKE 1 TABLET DAILY     lovastatin 20 MG tablet  Commonly known as:  MEVACOR  Take 1 tablet (20 mg total) by mouth at bedtime.     multivitamins ther. w/minerals Tabs tablet  Take 1 tablet by mouth daily.     niacin 1000 MG CR tablet  Commonly known as:  NIASPAN  Take 1 tablet (1,000 mg total) by mouth at bedtime.     nitroGLYCERIN 0.4 MG SL tablet  Commonly known as:  NITROSTAT  Place 1 tablet (0.4 mg total) under the tongue every 5 (five) minutes as needed for chest pain.     ONE TOUCH ULTRA TEST test strip  Generic drug:  glucose blood  USE AS DIRECTED     pantoprazole 40 MG tablet  Commonly known as:  PROTONIX  Take 1 tablet (40 mg total) by mouth every morning.     zolpidem 10 MG tablet  Commonly known as:  AMBIEN  Take 1 tablet (10 mg total) by mouth at bedtime as needed. For sleep           Objective:   Physical Exam BP 128/84 mmHg  Pulse 90  Temp(Src) 97.8 F (36.6 C) (Oral)  Ht  (1.727 m)  Wt 208 lb 8 oz (94.575 kg)  BMI 31.71 kg/m2  SpO2 94%       Assessment & Plan:

## 2015-03-12 NOTE — Progress Notes (Signed)
Pre visit review using our clinic review tool, if applicable. No additional management support is needed unless otherwise documented below in the visit note. 

## 2015-03-12 NOTE — Assessment & Plan Note (Signed)
History of COPD with chronic respiratory failure, currently at baseline, no lasting effects from recent COPD exacerbation. Currently using oxygen supplementation to maintain baseline.  Has albuterol to take as needed but patient has not had to use it yet. Has seen [name] recently for this condition but no plans to follow up. Plan: Will continue oxygen use and albuterol prn. Patient instructed to contact us if symptoms worsen.

## 2015-03-12 NOTE — Progress Notes (Signed)
Subjective:    Patient ID: Bradley [name], male    DOB: [birth year], 74 y.o.   MRN: [id number]  DOS:  03/12/2015 Type of visit - description :     Here for Medicare AWV:   1. Risk factors based on Past M, S, F history: reviewed   2. Physical Activities: active, yard and home chores, takes walks sometimes   3. Depression/mood: neg screen 4. Hearing: has hearing aids , uses them irregulalrly   5. ADL's: independent  , still drives 6. Fall Risk: no recent falls,  prevention discussed  , see AVS 7. Home Safety: does feel safe at home   8. Height, weight, &visual acuity: see VS, uses glasses no problems, due to see eye doctor this year, plans to go 9. Counseling: yes   10. Labs ordered based on risk factors: yes   11. Referral Coordination, if needed   12. Care Plan, see a/p   13. Cognitive Assessment: motor, cognition, memory wnl  14. Care team updated-- Dr Ladona Ridgel, Dr wert  15. End of life care, rec a healthcare POA  in addition, we discussed the following  Hypertension: Well-controlled, good adherence to medications. Patient denies any side effects of medications.  Diabetes: Patient checking sugar regularly, CBGs typically run in low 100s. Patient has not had an eye appointment in a while but knows he needs to set one up. Denies any numbness or tingling.   BPH: Patient denies any symptoms of dysuria/hematuria/urgency/frequency.   Insomnia: Taking ambien for insomnia, patient is not taking it every day, only as needed. Patient notes that Rem[employer] Loffler works most of the time.   CHF: Patient notes good adherence to Lasix, states care is being managed by Dr. Ladona Ridgel. Notes a follow up appointment with him at the end of June.    COPD: No lasting effects from COPD exacerbation a few months ago, currently back to baseline. Has seen [name] in the past for care but no plans to see him again. Patient has albuterol to be taken if needed but has not used it. States oxygen therapy is effective and has  portable oxygen as well.  HLD: Patient notes good adherence to medications, denies myalgias.    Review of Systems  Constitutional: No fever. No chills. No unexplained wt changes. No unusual sweats  HEENT: No dental problems, no ear discharge, no facial swelling, no voice changes. No eye discharge, no eye redness, no intolerance to light   Respiratory: Shortness of breath (at baseline) No cough, no mucus production  Cardiovascular: No CP, no leg swelling, no palpitations  GI: no nausea, no vomiting, no diarrhea, no abdominal pain.  No blood in the stools. No dysphagia, no odynophagia    Endocrine: No polyphagia, no polyuria, no polydipsia  GU: No dysuria, gross hematuria, difficulty urinating. No urinary urgency, no frequency.  Musculoskeletal: No joint swellings or unusual aches or pains  Skin: No change in the color of the skin, pallor, no rash  Allergic, immunologic: No environmental allergies, no food allergies  Neurological: No dizziness no  syncope. No headaches. No diplopia, no slurred speech, no motor deficits, no facial numbness  Hematological: No enlarged lymph nodes, no easy bruising, no unusual bleedings  Psychiatry: No suicidal ideas, no hallucinations, no behavior problems, no confusion.  No unusual/severe anxiety, no depression   Past Medical History  Diagnosis Date  . HTN (hypertension)   . HLD (hyperlipidemia)   . BPH (benign prostatic hyperplasia)     Saw Dr [name]  2004, normal renal u/s  . Pleural effusion 2008    S/p decortication  . Insomnia   . Atrial fibrillation     s/p AV node ablation & BiV PPM implantation 09/08/11 (op dictation pending)  . Pulmonary HTN     per cath 2008  . Peripheral vascular disease     ??  . Anemia     intermittent  . Pacemaker   . Heart murmur   . Type II diabetes mellitus 1999  . On home oxygen therapy     at night as needed  . 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 CAD (coronary  artery disease)   . Abnormal CT scan, chest 2012    CT chest, several lymphadenopathies. Sees pulmonary  . CHF (congestive heart failure)     Thought primarily to be non-systolic although EF down (EF 96-29% 12/2010, down to 35-40% 09/05/11), cath 2008 with no CAD, nuclear study 07/2011 showing Small area of reversibility in the distal ant/lat wall the left ventricle suspicious for ischemia/septal wall HK but felt to be low risk  (per D/C Summary 07/2011)  . Chronic bronchitis     "get it ~ q yr"  . Migraines     "very very rare"  . Arthritis     "?back" (08/22/2014)    Past Surgical History  Procedure Laterality Date  . Pleural scarification    . Lung decortication    . Colonoscopy  03/10/11    normal  . Pneumothorax with fibrothorax  ~ 2010  . Insert / replace / remove pacemaker  09/08/11    pacemaker placement  . Tonsillectomy      "as a kid" (09/27/2012)  . Cardiac catheterization      "couple times; never had balloon or stent" (09/27/2012)  . Bi-ventricular pacemaker insertion Left 09/08/2011    Procedure: BI-VENTRICULAR PACEMAKER INSERTION (CRT-P);  Surgeon: Marinus Maw, MD;  Location: [facility] CATH LAB;  Service: Cardiovascular;  Laterality: Left;    History   Social History  . Marital Status: Married    Spouse Name: [name]  . Number of Children: 1  . 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 Smoking status: Former Smoker -- 2.50 packs/day for 25 years    Types: Cigarettes    Quit date: 10/11/1980  . Smokeless tobacco: Never Used     Comment: used to smoke 2.5 ppd for 25 years  . Alcohol Use: Yes     Comment: 08/22/2014 "maybe q couple months I'll have a beer w/dinner"  . Drug Use: No  . Sexual Activity: Not Currently   Other Topics Concern  . Not on file   Social History Narrative   Moved from [place] 2003.[place]   He lives w/ his wife               Family History  Problem Relation Age of  Onset  . Diabetes Father   . Breast cancer Maternal Aunt   . Prostate cancer Neg Hx   . Colon cancer Neg Hx   . Coronary artery disease Father   . Polycythemia Mother       Medication List       This list is accurate as of: 03/12/15 10:54 AM.  Always use your most recent med list.               albuterol 108 (90 BASE)  MCG/ACT inhaler  Commonly known as:  PROVENTIL HFA;VENTOLIN HFA  Inhale 2 puffs into the lungs every 4 (four) hours as needed for wheezing or shortness of breath.     amLODipine 10 MG tablet  Commonly known as:  NORVASC  TAKE 1 TABLET DAILY     aspirin 81 MG tablet  Take 81 mg by mouth as needed.     carvedilol 25 MG tablet  Commonly known as:  COREG  Take 1 and 1/2 tablets twice a day with meals.     furosemide 40 MG tablet  Commonly known as:  LASIX  Take 1 1/2 tablets by mouth twice daily and as needed     glyBURIDE 5 MG tablet  Commonly known as:  DIABETA  Take 0.5 tablets (2.5 mg total) by mouth daily with breakfast.     JANUVIA 100 MG tablet  Generic drug:  sitaGLIPtin  TAKE 1 TABLET DAILY     losartan 100 MG tablet  Commonly known as:  COZAAR  TAKE 1 TABLET DAILY     lovastatin 20 MG tablet  Commonly known as:  MEVACOR  Take 1 tablet (20 mg total) by mouth at bedtime.     multivitamins ther. w/minerals Tabs tablet  Take 1 tablet by mouth daily.     niacin 1000 MG CR tablet  Commonly known as:  NIASPAN  Take 1 tablet (1,000 mg total) by mouth at bedtime.     nitroGLYCERIN 0.4 MG SL tablet  Commonly known as:  NITROSTAT  Place 1 tablet (0.4 mg total) under the tongue every 5 (five) minutes as needed for chest pain.     ONE TOUCH ULTRA TEST test strip  Generic drug:  glucose blood  USE AS DIRECTED     pantoprazole 40 MG tablet  Commonly known as:  PROTONIX  Take 1 tablet (40 mg total) by mouth every morning.     zolpidem 10 MG tablet  Commonly known as:  AMBIEN  Take 1 tablet (10 mg total) by mouth at bedtime as needed. For  sleep           Objective:   Physical Exam BP 128/84 mmHg  Pulse 90  Temp(Src) 97.8 F (36.6 C) (Oral)  Ht  (1.727 m)  Wt 208 lb 8 oz (94.575 kg)  BMI 31.71 kg/m2  SpO2 94%  General:   Well developed, well nourished . NAD.  Neck:  Full range of motion. Supple. No  thyromegaly , normal carotid pulse HEENT:  Normocephalic . Face symmetric, atraumatic Lungs:  Decreased breath sounds bilaterally Normal respiratory effort, no intercostal retractions, no accessory muscle use. Heart: RRR,  no murmur. Distal pulses intact. No pretibial edema bilaterally  Abdomen:  Not distended, soft, non-tender. No rebound or rigidity. No mass,organomegaly Skin: Exposed areas without rash. Not pale. Not jaundice Neurologic:  alert & oriented X3.  Speech normal, gait appropriate for age and unassisted Psych: Cognition and judgment appear intact.  Cooperative with normal attention span and concentration.  Behavior appropriate. No anxious or depressed appearing. DIABETIC FEET EXAM: No lower extremity edema Normal pedal pulses bilaterally Skin normal, nails dystrophic, no calluses Pinprick examination of the feet normal.     Assessment & Plan:   (Patient seen along with   Merilynn Finland, medical student)   Hypertension:  Well-controlled with current medications, today BP was 128/84. Notes no problems with medications. No changes. Will check CBC today.  HLD:  Good adherence to medications, patient notes no side effects from  medications. No med changes at this time, will check FLP and re-assess pending results.  BPH: DRE and PSA in 2015 unremarkable. Patient denies any urinary symptoms. Will recheck PSA next year if appropriate.

## 2015-03-12 NOTE — Telephone Encounter (Signed)
Pt is requesting refill on Lasix, per your notes from today's OV, Dr. Ladona Ridgel manages CHF, should Dr. Ladona Ridgel refill?

## 2015-03-12 NOTE — Assessment & Plan Note (Signed)
Care currently managed by Dr. Ladona Ridgel, will check BMP in the setting of Lasix use.  Patient will follow up with Dr. Ladona Ridgel at end of June.

## 2015-03-12 NOTE — Assessment & Plan Note (Signed)
Td 2014 Pneumonia 2005, 2010 Prevnar today Zostavax --> 2012  Colon cancer screening: C-scope: 02-2011 normal Prostate cancer screening: see BPH Labs

## 2015-03-12 NOTE — Assessment & Plan Note (Signed)
Patient notes good adherence to medications and is regularly checking CBGs which run in low 100s.  Diabetic Foot Exam today unremarkable except for dystrophic nails. Patient has not set up eye exam. Previous A1c 7.4, glyburide was restarted. Plan: No changes to medications at this time, will re-check A1c and re-assess. Patient instructed on importance of regular eye exams.

## 2015-03-12 NOTE — Assessment & Plan Note (Signed)
Takes [place] as needed, which works some of the time.  Will continue ambien use as needed and re-assess at next appointment.

## 2015-03-12 NOTE — Patient Instructions (Signed)
Get your blood work before you leave      Fall Prevention and Home Safety Falls cause injuries and can affect all age groups. It is possible to use preventive measures to significantly decrease the likelihood of falls. There are many simple measures which can make your home safer and prevent falls. OUTDOORS  Repair cracks and edges of walkways and driveways.  Remove high doorway thresholds.  Trim shrubbery on the main path into your home.  Have good outside lighting.  Clear walkways of tools, rocks, debris, and clutter.  Check that handrails are not broken and are securely fastened. Both sides of steps should have handrails.  Have leaves, snow, and ice cleared regularly.  Use sand or salt on walkways during winter months.  In the garage, clean up grease or oil spills. BATHROOM  Install night lights.  Install grab bars by the toilet and in the tub and shower.  Use non-skid mats or decals in the tub or shower.  Place a plastic non-slip stool in the shower to sit on, if needed.  Keep floors dry and clean up all water on the floor immediately.  Remove soap buildup in the tub or shower on a regular basis.  Secure bath mats with non-slip, double-sided rug tape.  Remove throw rugs and tripping hazards from the floors. BEDROOMS  Install night lights.  Make sure a bedside light is easy to reach.  Do not use oversized bedding.  Keep a telephone by your bedside.  Have a firm chair with side arms to use for getting dressed.  Remove throw rugs and tripping hazards from the floor. KITCHEN  Keep handles on pots and pans turned toward the center of the stove. Use back burners when possible.  Clean up spills quickly and allow time for drying.  Avoid walking on wet floors.  Avoid hot utensils and knives.  Position shelves so they are not too high or low.  Place commonly used objects within easy reach.  If necessary, use a sturdy step stool with a grab bar when  reaching.  Keep electrical cables out of the way.  Do not use floor polish or wax that makes floors slippery. If you must use wax, use non-skid floor wax.  Remove throw rugs and tripping hazards from the floor. STAIRWAYS  Never leave objects on stairs.  Place handrails on both sides of stairways and use them. Fix any loose handrails. Make sure handrails on both sides of the stairways are as long as the stairs.  Check carpeting to make sure it is firmly attached along stairs. Make repairs to worn or loose carpet promptly.  Avoid placing throw rugs at the top or bottom of stairways, or properly secure the rug with carpet tape to prevent slippage. Get rid of throw rugs, if possible.  Have an electrician put in a light switch at the top and bottom of the stairs. OTHER FALL PREVENTION TIPS  Wear low-heel or rubber-soled shoes that are supportive and fit well. Wear closed toe shoes.  When using a stepladder, make sure it is fully opened and both spreaders are firmly locked. Do not climb a closed stepladder.  Add color or contrast paint or tape to grab bars and handrails in your home. Place contrasting color strips on first and last steps.  Learn and use mobility aids as needed. Install an electrical emergency response system.  Turn on lights to avoid dark areas. Replace light bulbs that burn out immediately. Get light switches that glow.    Arrange furniture to create clear pathways. Keep furniture in the same place.  Firmly attach carpet with non-skid or double-sided tape.  Eliminate uneven floor surfaces.  Select a carpet pattern that does not visually hide the edge of steps.  Be aware of all pets. OTHER HOME SAFETY TIPS  Set the water temperature for 120 F (48.8 C).  Keep emergency numbers on or near the telephone.  Keep smoke detectors on every level of the home and near sleeping areas. Document Released: 09/17/2002 Document Revised: 03/28/2012 Document Reviewed:  12/17/2011 ExitCare Patient Information 2015 [employer]. This information is not intended to replace advice given to you by your health care provider. Make sure you discuss any questions you have with your health care provider.   Preventive Care for Adults Ages 65 and over  Blood pressure check.** / Every 1 to 2 years.  Lipid and cholesterol check.**/ Every 5 years beginning at age 20.  Lung cancer screening. / Every year if you are aged 55-80 years and have a 30-pack-year history of smoking and currently smoke or have quit within the past 15 years. Yearly screening is stopped once you have quit smoking for at least 15 years or develop a health problem that would prevent you from having lung cancer treatment.  Fecal occult blood test (FOBT) of stool. / Every year beginning at age 50 and continuing until age 75. You may not have to do this test if you get a colonoscopy every 10 years.  Flexible sigmoidoscopy** or colonoscopy.** / Every 5 years for a flexible sigmoidoscopy or every 10 years for a colonoscopy beginning at age 50 and continuing until age 75.  Hepatitis C blood test.** / For all people born from 1945 through 1965 and any individual with known risks for hepatitis C.  Abdominal aortic aneurysm (AAA) screening.** / A one-time screening for ages 65 to 75 years who are current or former smokers.  Skin self-exam. / Monthly.  Influenza vaccine. / Every year.  Tetanus, diphtheria, and acellular pertussis (Tdap/Td) vaccine.** / 1 dose of Td every 10 years.  Varicella vaccine.** / Consult your health care provider.  Zoster vaccine.** / 1 dose for adults aged 60 years or older.  Pneumococcal 13-valent conjugate (PCV13) vaccine.** / Consult your health care provider.  Pneumococcal polysaccharide (PPSV23) vaccine.** / 1 dose for all adults aged 65 years and older.  Meningococcal vaccine.** / Consult your health care provider.  Hepatitis A vaccine.** / Consult your health care  provider.  Hepatitis B vaccine.** / Consult your health care provider.  Haemophilus influenzae type b (Hib) vaccine.** / Consult your health care provider. **Family history and personal history of risk and conditions may change your health care provider's recommendations. Document Released: 11/23/2001 Document Revised: 10/02/2013 Document Reviewed: 02/22/2011 ExitCare Patient Information 2015 [employer]. This information is not intended to replace advice given to you by your health care provider. Make sure you discuss any questions you have with your health care provider.   

## 2015-03-13 NOTE — Telephone Encounter (Signed)
Pt reported he was taking 2 tablets bid at appt yesterday. New dosage sent to [facility].

## 2015-03-19 ENCOUNTER — Encounter: Payer: Self-pay | Admitting: Internal Medicine

## 2015-03-19 ENCOUNTER — Telehealth: Payer: Self-pay

## 2015-03-19 MED ORDER — NITROGLYCERIN 0.4 MG SL SUBL: 0.4000 mg | SUBLINGUAL_TABLET | SUBLINGUAL | Status: DC | PRN

## 2015-03-19 NOTE — Telephone Encounter (Signed)
Pt is requesting refill on Nitroglycerin.  Last OV: 03/12/2015 Last Fill: 09/30/2014 #20 3RF Pharmacy change to [facility], Rx previously sent to local [facility]   Please advise.

## 2015-03-19 NOTE — Telephone Encounter (Signed)
Rx sent to [facility]

## 2015-03-19 NOTE — Telephone Encounter (Signed)
Ok 60, 3 RF 

## 2015-03-26 ENCOUNTER — Encounter: Payer: Self-pay | Admitting: Cardiology

## 2015-04-21 ENCOUNTER — Telehealth: Payer: Self-pay

## 2015-04-21 MED ORDER — ZOLPIDEM TARTRATE 10 MG PO TABS: 10.0000 mg | ORAL_TABLET | Freq: Every evening | ORAL | Status: DC | PRN

## 2015-04-21 NOTE — Telephone Encounter (Signed)
Okay #90, one refill 

## 2015-04-21 NOTE — Telephone Encounter (Signed)
Rx printed, awaiting MD signature.  

## 2015-04-21 NOTE — Telephone Encounter (Signed)
Pt is requesting refill on Ambien.   Last OV: 03/12/2015 Last Fill: 10/21/2014 #[phone] 0RF   Please advise.

## 2015-04-21 NOTE — Telephone Encounter (Signed)
Rx faxed to [facility].

## 2015-05-13 ENCOUNTER — Encounter: Payer: Self-pay | Admitting: Internal Medicine

## 2015-05-13 ENCOUNTER — Ambulatory Visit (INDEPENDENT_AMBULATORY_CARE_PROVIDER_SITE_OTHER): Payer: Medicare Other | Admitting: Internal Medicine

## 2015-05-13 VITALS — BP 122/64 | HR 76 | Ht 68.0 in | Wt 210.6 lb

## 2015-05-13 DIAGNOSIS — I482 Chronic atrial fibrillation, unspecified: Secondary | ICD-10-CM

## 2015-05-13 DIAGNOSIS — I5042 Chronic combined systolic (congestive) and diastolic (congestive) heart failure: Secondary | ICD-10-CM | POA: Diagnosis not present

## 2015-05-13 DIAGNOSIS — I5023 Acute on chronic systolic (congestive) heart failure: Secondary | ICD-10-CM

## 2015-05-13 DIAGNOSIS — I5022 Chronic systolic (congestive) heart failure: Secondary | ICD-10-CM | POA: Diagnosis not present

## 2015-05-13 DIAGNOSIS — I495 Sick sinus syndrome: Secondary | ICD-10-CM | POA: Diagnosis not present

## 2015-05-13 DIAGNOSIS — Z950 Presence of cardiac pacemaker: Secondary | ICD-10-CM

## 2015-05-13 NOTE — Assessment & Plan Note (Signed)
His ventricular rate is well controlled. He will continue his current med. He has had no bleeding. He is off of anticoagulation.

## 2015-05-13 NOTE — Assessment & Plan Note (Signed)
His medtronic BiV PM is working normally. Will recheck in several months. 

## 2015-05-13 NOTE — Progress Notes (Signed)
HPI Bradley [name] returns today for followup. He is a 74 year old man with chronic atrial fibrillation, nonischemic cardiomyopathy, status post AV node ablation and biventricular pacemaker insertion. In the interim, the patient has done better. His heart failure symptoms are well compensated. He notes that his weight is controlled. He has tried to improve his sodium intake and his nose bleeds have resolved. Allergies  Allergen Reactions  . Hydrocodone Other (See Comments)    "given to him in the hospital; went thru withdrawals once home; dr said not to take it again" (09/27/2012)  . Tramadol Other (See Comments)    "given to him in the hospital; went thru withdrawals once home; dr said not to take it again" (09/27/2012)  . Tadalafil Other (See Comments)    headache, backache  . Avelox [Moxifloxacin Hydrochloride] Itching and Other (See Comments)    Headache     Current Outpatient Prescriptions  Medication Sig Dispense Refill  . albuterol (PROVENTIL HFA;VENTOLIN HFA) 108 (90 BASE) MCG/ACT inhaler Inhale 2 puffs into the lungs every 4 (four) hours as needed for wheezing or shortness of breath. 1 Inhaler 0  . amLODipine (NORVASC) 10 MG tablet TAKE 1 TABLET DAILY 90 tablet 2  . aspirin 81 MG tablet Take 81 mg by mouth as needed.     . carvedilol (COREG) 25 MG tablet Take 1 and 1/2 tablets twice a day with meals. 270 tablet 1  . furosemide (LASIX) 40 MG tablet Take 1 1/2 tablets by mouth twice daily and as needed (Patient taking differently: Take 80 mg by mouth 2 (two) times daily. ) 270 tablet 1  . glyBURIDE (DIABETA) 5 MG tablet Take 0.5 tablets (2.5 mg total) by mouth daily with breakfast. 45 tablet 2  . JANUVIA 100 MG tablet TAKE 1 TABLET DAILY (Patient taking differently: TAKE 1 TABLET BY MOUTH DAILY) 90 tablet 2  . losartan (COZAAR) 100 MG tablet TAKE 1 TABLET DAILY 90 tablet 2  . lovastatin (MEVACOR) 20 MG tablet Take 1 tablet (20 mg total) by mouth at bedtime. 90 tablet 2  . Multiple  Vitamins-Minerals (MULTIVITAMINS THER. W/MINERALS) TABS tablet Take 1 tablet by mouth daily. 30 each 5  . niacin (NIASPAN) 1000 MG CR tablet Take 1 tablet (1,000 mg total) by mouth at bedtime. 90 tablet 2  . nitroGLYCERIN (NITROSTAT) 0.4 MG SL tablet Place 1 tablet (0.4 mg total) under the tongue every 5 (five) minutes as needed for chest pain. 60 tablet 3  . ONE TOUCH ULTRA TEST test strip USE AS DIRECTED 3 each 5  . pantoprazole (PROTONIX) 40 MG tablet Take 1 tablet (40 mg total) by mouth every morning. 90 tablet 1  . zolpidem (AMBIEN) 10 MG tablet Take 1 tablet (10 mg total) by mouth at bedtime as needed. For sleep 90 tablet 1   No current facility-administered medications for this visit.     Past Medical History  Diagnosis Date  . HTN (hypertension)   . HLD (hyperlipidemia)   . BPH (benign prostatic hyperplasia)     Saw Dr [name] 2004, normal renal u/s  . Pleural effusion 2008    S/p decortication  . Insomnia   . Atrial fibrillation     s/p AV node ablation & BiV PPM implantation 09/08/11 (op dictation pending)  . Pulmonary HTN     per cath 2008  . Peripheral vascular disease     ??  . Anemia     intermittent  . Pacemaker   . Heart murmur   .  Type II diabetes mellitus 1999  . On home oxygen therapy     at night as needed  . 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 CAD (coronary artery disease)   . Abnormal CT scan, chest 2012    CT chest, several lymphadenopathies. Sees pulmonary  . CHF (congestive heart failure)     Thought primarily to be non-systolic although EF down (EF 29-56% 12/2010, down to 35-40% 09/05/11), cath 2008 with no CAD, nuclear study 07/2011 showing Small area of reversibility in the distal ant/lat wall the left ventricle suspicious for ischemia/septal wall HK but felt to be low risk  (per D/C Summary 07/2011)  . Chronic bronchitis     "get it ~ q yr"  . Migraines     "very very rare"  . Arthritis     "?back" (08/22/2014)     ROS:   All systems reviewed and negative except as noted in the HPI.   Past Surgical History  Procedure Laterality Date  . Pleural scarification    . Lung decortication    . Colonoscopy  03/10/11    normal  . Pneumothorax with fibrothorax  ~ 2010  . Insert / replace / remove pacemaker  09/08/11    pacemaker placement  . Tonsillectomy      "as a kid" (09/27/2012)  . Cardiac catheterization      "couple times; never had balloon or stent" (09/27/2012)  . Bi-ventricular pacemaker insertion Left 09/08/2011    Procedure: BI-VENTRICULAR PACEMAKER INSERTION (CRT-P);  Surgeon: Marinus Maw, MD;  Location: [facility] CATH LAB;  Service: Cardiovascular;  Laterality: Left;     Family History  Problem Relation Age of Onset  . Diabetes Father   . Breast cancer Maternal Aunt   . Prostate cancer Neg Hx   . Colon cancer Neg Hx   . Coronary artery disease Father   . Polycythemia Mother      History   Social History  . Marital Status: Married    Spouse Name: Bradley [name]  . Number of Children: 1  . 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 Smoking status: Former Smoker -- 2.50 packs/day for 25 years    Types: Cigarettes    Quit date: 10/11/1980  . Smokeless tobacco: Never Used     Comment: used to smoke 2.5 ppd for 25 years  . Alcohol Use: Yes     Comment: 08/22/2014 "maybe q couple months I'll have a beer w/dinner"  . Drug Use: No  . Sexual Activity: Not Currently   Other Topics Concern  . Not on file   Social History Narrative   Moved from [place] 2003.Mar[place]   He lives w/ his wife               BP 122/64 mmHg  Pulse 76  Ht  (1.727 m)  Wt 210 lb 9.6 oz (95.528 kg)  BMI 32.03 kg/m2  Physical Exam:  Well appearing 74 year old man,NAD HEENT: Unremarkable Neck:  6 cmJVD, no thyromegally Lungs:  Clear with no rales, or rhonchi. Scattered wheezes HEART:  Regular rate rhythm, no murmurs, no rubs, no  clicks Abd:  soft, positive bowel sounds, no organomegally, no rebound, no guarding Ext:  2 plus pulses, trace edema, no cyanosis, no clubbing Skin:  No rashes no nodules Neuro:  CN II through XII intact, motor grossly intact   DEVICE  Normal  device function.  See PaceArt for details. Optivol is down.  Assess/Plan:

## 2015-05-13 NOTE — Assessment & Plan Note (Signed)
His symptoms remain class 2. He is encouraged to continue his active lifestyle and his current meds.

## 2015-05-13 NOTE — Patient Instructions (Addendum)
Medication Instructions:  Your physician recommends that you continue on your current medications as directed. Please refer to the Current Medication list given to you today.   Labwork: NONE  Testing/Procedures: NONE  Follow-Up: Your physician wants you to follow-up in: 12 months with Dr. Taylor You will receive a reminder letter in the mail two months in advance. If you don't receive a letter, please call our office to schedule the follow-up appointment.  Remote monitoring is used to monitor your Pacemaker or ICD from home. This monitoring reduces the number of office visits required to check your device to one time per year. It allows us to keep an eye on the functioning of your device to ensure it is working properly. You are scheduled for a device check from home on 08/12/2015. You may send your transmission at any time that day. If you have a wireless device, the transmission will be sent automatically. After your physician reviews your transmission, you will receive a postcard with your next transmission date.    Any Other Special Instructions Will Be Listed Below (If Applicable).   

## 2015-05-15 LAB — CUP PACEART INCLINIC DEVICE CHECK
Battery Remaining Longevity: 63 mo
Battery Voltage: 3.01 V
Brady Statistic AP VP Percent: 0 %
Brady Statistic AP VS Percent: 0 %
Brady Statistic AS VP Percent: 96.02 %
Brady Statistic AS VS Percent: 3.98 %
Brady Statistic RA Percent Paced: 0 %
Brady Statistic RV Percent Paced: 96.02 %
Date Time Interrogation Session: 20160802205206
Lead Channel Impedance Value: 323 Ohm
Lead Channel Impedance Value: 4047 Ohm
Lead Channel Impedance Value: 4047 Ohm
Lead Channel Impedance Value: 418 Ohm
Lead Channel Impedance Value: 475 Ohm
Lead Channel Impedance Value: 513 Ohm
Lead Channel Impedance Value: 551 Ohm
Lead Channel Impedance Value: 608 Ohm
Lead Channel Impedance Value: 627 Ohm
Lead Channel Pacing Threshold Amplitude: 0.5 V
Lead Channel Pacing Threshold Amplitude: 1 V
Lead Channel Pacing Threshold Pulse Width: 0.4 ms
Lead Channel Pacing Threshold Pulse Width: 0.4 ms
Lead Channel Setting Pacing Amplitude: 2 V
Lead Channel Setting Pacing Amplitude: 2.25 V
Lead Channel Setting Pacing Pulse Width: 0.4 ms
Lead Channel Setting Pacing Pulse Width: 0.4 ms
Lead Channel Setting Sensing Sensitivity: 4 mV
Zone Setting Detection Interval: 350 ms
Zone Setting Detection Interval: 400 ms

## 2015-06-06 ENCOUNTER — Other Ambulatory Visit: Payer: Self-pay | Admitting: Internal Medicine

## 2015-06-21 ENCOUNTER — Other Ambulatory Visit: Payer: Self-pay | Admitting: Internal Medicine

## 2015-06-27 ENCOUNTER — Other Ambulatory Visit: Payer: Self-pay | Admitting: Internal Medicine

## 2015-07-02 ENCOUNTER — Other Ambulatory Visit: Payer: Self-pay | Admitting: Internal Medicine

## 2015-07-15 ENCOUNTER — Ambulatory Visit (INDEPENDENT_AMBULATORY_CARE_PROVIDER_SITE_OTHER): Payer: Medicare Other | Admitting: Internal Medicine

## 2015-07-15 ENCOUNTER — Encounter: Payer: Self-pay | Admitting: Internal Medicine

## 2015-07-15 VITALS — BP 118/76 | HR 90 | Temp 98.1°F | Ht 68.0 in | Wt 214.0 lb

## 2015-07-15 DIAGNOSIS — N400 Enlarged prostate without lower urinary tract symptoms: Secondary | ICD-10-CM

## 2015-07-15 DIAGNOSIS — Z09 Encounter for follow-up examination after completed treatment for conditions other than malignant neoplasm: Secondary | ICD-10-CM

## 2015-07-15 DIAGNOSIS — Z23 Encounter for immunization: Secondary | ICD-10-CM | POA: Diagnosis not present

## 2015-07-15 DIAGNOSIS — F419 Anxiety disorder, unspecified: Secondary | ICD-10-CM | POA: Diagnosis not present

## 2015-07-15 DIAGNOSIS — E119 Type 2 diabetes mellitus without complications: Secondary | ICD-10-CM

## 2015-07-15 LAB — HM DIABETES FOOT EXAM

## 2015-07-15 MED ORDER — TAMSULOSIN HCL 0.4 MG PO CAPS: 0.4000 mg | ORAL_CAPSULE | Freq: Every day | ORAL | Status: DC

## 2015-07-15 NOTE — Progress Notes (Signed)
Pre visit review using our clinic review tool, if applicable. No additional management support is needed unless otherwise documented below in the visit note. 

## 2015-07-15 NOTE — Patient Instructions (Signed)
Get your blood work before you leave   Start Flomax at nighttime, a medication to help with your urinary symptoms.  Diabetes: Check your blood sugar  once a day    at different times of the day GOALS: Fasting before a meal 70- 130 2 hours after a meal less than 180 At bedtime 90-150 Call if consistently not at goal   Next visit  for a routine visit in 4-5 months Please schedule an appointment at the front desk No need to come back fasting

## 2015-07-15 NOTE — Progress Notes (Signed)
Subjective:    Patient ID: Bradley Sit., male    DOB: [birth year], 74 y.o.   MRN: [id number]  DOS:  07/15/2015 Type of visit - description : rov Interval history: Diabetes: Last A1c very low, blood sugars usually 100, 115. Occasionally higher readings depending on diet.Denies low sugars or low sugar symptoms. Anxiety: Still having a lot of on his plate related to his son behavior. BPH: Ongoing nocturia, sx bothersome to the patient, wakes up ~ 3 times a night on average, can't sleep well. Cardiovascular: Saw cardiology, felt to be stable  Review of Systems Denies chest pain, lower extremity edema. Difficulty breathing at baseline No cough, hemoptysis or sputum production No lower strip paresthesias No gross hematuria, dysuria or difficulty urinating  Past Medical History  Diagnosis Date  . HTN (hypertension)   . HLD (hyperlipidemia)   . BPH (benign prostatic hyperplasia)     Saw Dr [name] 2004, normal renal u/s  . Pleural effusion 2008    S/p decortication  . Insomnia   . Atrial fibrillation (HCC)     s/p AV node ablation & BiV PPM implantation 09/08/11 (op dictation pending)  . Pulmonary HTN (HCC)     per cath 2008  . Peripheral vascular disease (HCC)     ??  . Anemia     intermittent  . Pacemaker   . Heart murmur   . Type II diabetes mellitus (HCC) 1999  . On home oxygen therapy     at night as needed  . 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 CAD (coronary artery disease)   . Abnormal CT scan, chest 2012    CT chest, several lymphadenopathies. Sees pulmonary  . CHF (congestive heart failure) (HCC)     Thought primarily to be non-systolic although EF down (EF 40-98% 12/2010, down to 35-40% 09/05/11), cath 2008 with no CAD, nuclear study 07/2011 showing Small area of reversibility in the distal ant/lat wall the left ventricle suspicious for ischemia/septal wall HK but felt to be low risk  (per D/C Summary 07/2011)  . Chronic  bronchitis (HCC)     "get it ~ q yr"  . Migraines     "very very rare"  . Arthritis     "?back" (08/22/2014)    Past Surgical History  Procedure Laterality Date  . Pleural scarification    . Lung decortication    . Colonoscopy  03/10/11    normal  . Pneumothorax with fibrothorax  ~ 2010  . Insert / replace / remove pacemaker  09/08/11    pacemaker placement  . Tonsillectomy      "as a kid" (09/27/2012)  . Cardiac catheterization      "couple times; never had balloon or stent" (09/27/2012)  . Bi-ventricular pacemaker insertion Left 09/08/2011    Procedure: BI-VENTRICULAR PACEMAKER INSERTION (CRT-P);  Surgeon: Marinus Maw, MD;  Location: [facility] CATH LAB;  Service: Cardiovascular;  Laterality: Left;    Social History   Social History  . Marital Status: Married    Spouse Name: Bradley [name]  . Number of Children: 1  . 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 Smoking status: Former Smoker -- 2.50 packs/day for 25 years    Types: Cigarettes    Quit date: 10/11/1980  . Smokeless tobacco: Never Used     Comment: used to smoke 2.5 ppd for 25 years  .  Alcohol Use: Yes     Comment: 08/22/2014 "maybe q couple months I'll have a beer w/dinner"  . Drug Use: No  . Sexual Activity: Not Currently   Other Topics Concern  . Not on file   Social History Narrative   Moved from [place] 2003.[place]   He lives w/ his wife                  Medication List       This list is accurate as of: 07/15/15  9:40 PM.  Always use your most recent med list.               albuterol 108 (90 BASE) MCG/ACT inhaler  Commonly known as:  PROVENTIL HFA;VENTOLIN HFA  Inhale 2 puffs into the lungs every 4 (four) hours as needed for wheezing or shortness of breath.     amLODipine 10 MG tablet  Commonly known as:  NORVASC  TAKE 1 TABLET DAILY     aspirin 81 MG tablet  Take 81 mg by mouth as needed.     carvedilol 25 MG tablet    Commonly known as:  COREG  Take 1 and 1/2 tablets twice a day with meals.     furosemide 40 MG tablet  Commonly known as:  LASIX  Take 1 1/2 tablets by mouth twice daily and as needed     glucose blood test strip  Commonly known as:  ONE TOUCH ULTRA TEST  Check blood sugar no more than twice daily.     glyBURIDE 5 MG tablet  Commonly known as:  DIABETA  Take 0.5 tablets (2.5 mg total) by mouth daily with breakfast.     losartan 100 MG tablet  Commonly known as:  COZAAR  TAKE 1 TABLET DAILY     lovastatin 20 MG tablet  Commonly known as:  MEVACOR  Take 1 tablet (20 mg total) by mouth at bedtime.     multivitamins ther. w/minerals Tabs tablet  Take 1 tablet by mouth daily.     niacin 1000 MG CR tablet  Commonly known as:  NIASPAN  Take 1 tablet (1,000 mg total) by mouth at bedtime.     nitroGLYCERIN 0.4 MG SL tablet  Commonly known as:  NITROSTAT  Place 1 tablet (0.4 mg total) under the tongue every 5 (five) minutes as needed for chest pain.     pantoprazole 40 MG tablet  Commonly known as:  PROTONIX  Take 1 tablet (40 mg total) by mouth every morning.     sitaGLIPtin 100 MG tablet  Commonly known as:  JANUVIA  Take 1 tablet (100 mg total) by mouth daily.     tamsulosin 0.4 MG Caps capsule  Commonly known as:  FLOMAX  Take 1 capsule (0.4 mg total) by mouth daily.     zolpidem 10 MG tablet  Commonly known as:  AMBIEN  Take 1 tablet (10 mg total) by mouth at bedtime as needed. For sleep           Objective:   Physical Exam BP 118/76 mmHg  Pulse 90  Temp(Src) 98.1 F (36.7 C) (Oral)  Ht  (1.727 m)  Wt 214 lb (97.07 kg)  BMI 32.55 kg/m2  SpO2 90%  General:   Well developed, well nourished . NAD.  HEENT:  Normocephalic . Face symmetric, atraumatic Lungs:  CTA B Normal respiratory effort, no intercostal retractions, no accessory muscle use. Heart: seems regular  No pretibial edema bilaterally  DM foot  exam: No edema, normal pedal pulses,  pinprick examination normal, toenails thick Rectal:  External abnormalities: none. Normal sphincter tone. No rectal masses or tenderness.  Stool brown  Prostate: Prostate gland firm and smooth, enlarge but no nodules. No TTP. Neurologic:  alert & oriented X3.  Speech normal, gait appropriate for age and unassisted Psych--  Cognition and judgment appear intact.  Cooperative with normal attention span and concentration.  Behavior appropriate. No anxious or depressed appearing.      Assessment & Plan:   Assessment > DM HTN Hyperlipidemia Anxiety- insomnia  BPH Cardiovascular:Dr Ladona Ridgel  --CAD --CHF --Atrial fibrillation --Pacemaker --h/o IC bleeding: Not anticoagulated --Peripheral vascular disease? Pulmonary: Dr [name] --COPD, severe, chronic resp failure, night O2 prn --Abnormal CT chest, multiple lymphadenopathies. --Pleural effusion s/p decortication --Pulmonary hypertension  Stasis dermatitis   Plan: Diabetes: Last A1c low, no symptoms of low sugar. Feet exam negative today. We'll recheck an A1c, if a1c  continue to be low will consider discontinue glyburide BPH: Ongoing symptoms, DRE normal today, last PSA was more than a year ago. Will prescribe Flomax, check a PSA  and a UA. Cardiovascular disease: Seems to be stable Anxiety: Counseled to the best of my ability Primary  care: Flu shot today

## 2015-07-15 NOTE — Assessment & Plan Note (Signed)
Diabetes: Last A1c low, no symptoms of low sugar. Feet exam negative today. We'll recheck an A1c, if a1c  continue to be low will consider discontinue glyburide BPH: Ongoing symptoms, DRE normal today, last PSA was more than a year ago. Will prescribe Flomax, check a PSA  and a UA. Cardiovascular disease: Seems to be stable Anxiety: Counseled to the best of my ability Primary  care: Flu shot today

## 2015-07-16 LAB — URINALYSIS, ROUTINE W REFLEX MICROSCOPIC
Bilirubin Urine: NEGATIVE
Leukocytes, UA: NEGATIVE
Nitrite: NEGATIVE
Specific Gravity, Urine: 1.015 (ref 1.000–1.030)
Total Protein, Urine: NEGATIVE
Urine Glucose: NEGATIVE
Urobilinogen, UA: 1 (ref 0.0–1.0)
pH: 6 (ref 5.0–8.0)

## 2015-07-16 LAB — PSA, MEDICARE: PSA: 3.6 ng/ml (ref 0.10–4.00)

## 2015-07-16 LAB — HEMOGLOBIN A1C: Hgb A1c MFr Bld: 5.7 % (ref 4.6–6.5)

## 2015-07-17 NOTE — Addendum Note (Signed)
Addended by: Dorette Grate on: 07/17/2015 02:12 PM   Modules accepted: Orders, Medications

## 2015-07-22 ENCOUNTER — Other Ambulatory Visit: Payer: Self-pay | Admitting: Internal Medicine

## 2015-07-24 ENCOUNTER — Other Ambulatory Visit: Payer: Self-pay | Admitting: Internal Medicine

## 2015-08-12 ENCOUNTER — Ambulatory Visit (INDEPENDENT_AMBULATORY_CARE_PROVIDER_SITE_OTHER): Payer: Medicare Other | Admitting: *Deleted

## 2015-08-12 ENCOUNTER — Telehealth: Payer: Self-pay | Admitting: Internal Medicine

## 2015-08-12 DIAGNOSIS — I495 Sick sinus syndrome: Secondary | ICD-10-CM

## 2015-08-12 NOTE — Telephone Encounter (Signed)
Patient spoke with Britta Mccreedy, CMA, who walked him through transmission process.

## 2015-08-12 NOTE — Telephone Encounter (Signed)
°  1. Has your device fired? No ° °2. Is you device beeping? No ° °3. Are you experiencing draining or swelling at device site? No ° °4. Are you calling to see if we received your device transmission? Yes ° °5. Have you passed out? No ° °

## 2015-08-13 NOTE — Progress Notes (Signed)
Remote pacemaker transmission.   

## 2015-08-21 LAB — CUP PACEART REMOTE DEVICE CHECK
Battery Remaining Longevity: 66 mo
Battery Voltage: 3.01 V
Brady Statistic AP VP Percent: 0 %
Brady Statistic AP VS Percent: 0 %
Brady Statistic AS VP Percent: 97.12 %
Brady Statistic AS VS Percent: 2.88 %
Brady Statistic RA Percent Paced: 0 %
Brady Statistic RV Percent Paced: 97.12 %
Date Time Interrogation Session: 20161101185053
Implantable Lead Implant Date: 20121128
Implantable Lead Implant Date: 20121128
Implantable Lead Location: 753858
Implantable Lead Location: 753860
Implantable Lead Model: 419488
Implantable Lead Model: 5076
Lead Channel Impedance Value: 304 Ohm
Lead Channel Impedance Value: 4047 Ohm
Lead Channel Impedance Value: 4047 Ohm
Lead Channel Impedance Value: 418 Ohm
Lead Channel Impedance Value: 475 Ohm
Lead Channel Impedance Value: 475 Ohm
Lead Channel Impedance Value: 551 Ohm
Lead Channel Impedance Value: 589 Ohm
Lead Channel Impedance Value: 608 Ohm
Lead Channel Pacing Threshold Amplitude: 0.625 V
Lead Channel Pacing Threshold Amplitude: 0.875 V
Lead Channel Pacing Threshold Pulse Width: 0.4 ms
Lead Channel Pacing Threshold Pulse Width: 0.4 ms
Lead Channel Sensing Intrinsic Amplitude: 5 mV
Lead Channel Sensing Intrinsic Amplitude: 5 mV
Lead Channel Setting Pacing Amplitude: 2 V
Lead Channel Setting Pacing Amplitude: 2 V
Lead Channel Setting Pacing Pulse Width: 0.4 ms
Lead Channel Setting Pacing Pulse Width: 0.4 ms
Lead Channel Setting Sensing Sensitivity: 4 mV

## 2015-08-22 ENCOUNTER — Encounter: Payer: Self-pay | Admitting: Cardiology

## 2015-08-26 ENCOUNTER — Other Ambulatory Visit: Payer: Self-pay

## 2015-08-26 MED ORDER — TAMSULOSIN HCL 0.4 MG PO CAPS: 0.4000 mg | ORAL_CAPSULE | Freq: Every day | ORAL | Status: DC

## 2015-09-09 ENCOUNTER — Encounter: Payer: Self-pay | Admitting: Cardiology

## 2015-09-11 ENCOUNTER — Other Ambulatory Visit: Payer: Self-pay | Admitting: Internal Medicine

## 2015-09-21 ENCOUNTER — Other Ambulatory Visit: Payer: Self-pay | Admitting: Internal Medicine

## 2015-09-25 ENCOUNTER — Other Ambulatory Visit: Payer: Self-pay | Admitting: Internal Medicine

## 2015-11-11 ENCOUNTER — Ambulatory Visit (INDEPENDENT_AMBULATORY_CARE_PROVIDER_SITE_OTHER): Payer: Medicare Other | Admitting: *Deleted

## 2015-11-11 DIAGNOSIS — I495 Sick sinus syndrome: Secondary | ICD-10-CM | POA: Diagnosis not present

## 2015-11-11 NOTE — Progress Notes (Signed)
[facility] Summary Report / Loop Recorder 

## 2015-11-18 ENCOUNTER — Other Ambulatory Visit: Payer: Self-pay | Admitting: Internal Medicine

## 2015-11-24 ENCOUNTER — Encounter: Payer: Self-pay | Admitting: Cardiology

## 2015-11-24 LAB — CUP PACEART REMOTE DEVICE CHECK
Battery Remaining Longevity: 68 mo
Battery Voltage: 3 V
Brady Statistic AP VP Percent: 0 %
Brady Statistic AP VS Percent: 0 %
Brady Statistic AS VP Percent: 97.75 %
Brady Statistic AS VS Percent: 2.25 %
Brady Statistic RA Percent Paced: 0 %
Brady Statistic RV Percent Paced: 97.75 %
Date Time Interrogation Session: 20170131203414
Implantable Lead Implant Date: 20121128
Implantable Lead Implant Date: 20121128
Implantable Lead Location: 753858
Implantable Lead Location: 753860
Implantable Lead Model: 419488
Implantable Lead Model: 5076
Lead Channel Impedance Value: 323 Ohm
Lead Channel Impedance Value: 4047 Ohm
Lead Channel Impedance Value: 4047 Ohm
Lead Channel Impedance Value: 418 Ohm
Lead Channel Impedance Value: 456 Ohm
Lead Channel Impedance Value: 494 Ohm
Lead Channel Impedance Value: 551 Ohm
Lead Channel Impedance Value: 589 Ohm
Lead Channel Impedance Value: 589 Ohm
Lead Channel Pacing Threshold Amplitude: 0.75 V
Lead Channel Pacing Threshold Amplitude: 0.875 V
Lead Channel Pacing Threshold Pulse Width: 0.4 ms
Lead Channel Pacing Threshold Pulse Width: 0.4 ms
Lead Channel Sensing Intrinsic Amplitude: 4.125 mV
Lead Channel Sensing Intrinsic Amplitude: 4.125 mV
Lead Channel Setting Pacing Amplitude: 2 V
Lead Channel Setting Pacing Amplitude: 2 V
Lead Channel Setting Pacing Pulse Width: 0.4 ms
Lead Channel Setting Pacing Pulse Width: 0.4 ms
Lead Channel Setting Sensing Sensitivity: 4 mV

## 2015-12-16 ENCOUNTER — Ambulatory Visit: Payer: Medicare Other | Admitting: Internal Medicine

## 2015-12-18 ENCOUNTER — Emergency Department (HOSPITAL_BASED_OUTPATIENT_CLINIC_OR_DEPARTMENT_OTHER): Payer: Medicare Other

## 2015-12-18 ENCOUNTER — Encounter (HOSPITAL_BASED_OUTPATIENT_CLINIC_OR_DEPARTMENT_OTHER): Payer: Self-pay | Admitting: *Deleted

## 2015-12-18 ENCOUNTER — Inpatient Hospital Stay (HOSPITAL_BASED_OUTPATIENT_CLINIC_OR_DEPARTMENT_OTHER)
Admission: EM | Admit: 2015-12-18 | Discharge: 2015-12-20 | DRG: 871 | Disposition: A | Discharge status: Home / Self Care | Payer: Medicare Other | Attending: Internal Medicine | Admitting: Internal Medicine

## 2015-12-18 DIAGNOSIS — Z7982 Long term (current) use of aspirin: Secondary | ICD-10-CM

## 2015-12-18 DIAGNOSIS — N400 Enlarged prostate without lower urinary tract symptoms: Secondary | ICD-10-CM | POA: Diagnosis present

## 2015-12-18 DIAGNOSIS — I25810 Atherosclerosis of coronary artery bypass graft(s) without angina pectoris: Secondary | ICD-10-CM | POA: Diagnosis present

## 2015-12-18 DIAGNOSIS — I110 Hypertensive heart disease with heart failure: Secondary | ICD-10-CM | POA: Diagnosis present

## 2015-12-18 DIAGNOSIS — I5043 Acute on chronic combined systolic (congestive) and diastolic (congestive) heart failure: Secondary | ICD-10-CM | POA: Diagnosis present

## 2015-12-18 DIAGNOSIS — E785 Hyperlipidemia, unspecified: Secondary | ICD-10-CM | POA: Diagnosis present

## 2015-12-18 DIAGNOSIS — I2510 Atherosclerotic heart disease of native coronary artery without angina pectoris: Secondary | ICD-10-CM | POA: Diagnosis present

## 2015-12-18 DIAGNOSIS — J9622 Acute and chronic respiratory failure with hypercapnia: Secondary | ICD-10-CM | POA: Diagnosis present

## 2015-12-18 DIAGNOSIS — Z951 Presence of aortocoronary bypass graft: Secondary | ICD-10-CM

## 2015-12-18 DIAGNOSIS — I272 Other secondary pulmonary hypertension: Secondary | ICD-10-CM | POA: Diagnosis present

## 2015-12-18 DIAGNOSIS — I4891 Unspecified atrial fibrillation: Secondary | ICD-10-CM | POA: Diagnosis present

## 2015-12-18 DIAGNOSIS — A419 Sepsis, unspecified organism: Principal | ICD-10-CM | POA: Diagnosis present

## 2015-12-18 DIAGNOSIS — I081 Rheumatic disorders of both mitral and tricuspid valves: Secondary | ICD-10-CM | POA: Diagnosis present

## 2015-12-18 DIAGNOSIS — E119 Type 2 diabetes mellitus without complications: Secondary | ICD-10-CM | POA: Diagnosis present

## 2015-12-18 DIAGNOSIS — I495 Sick sinus syndrome: Secondary | ICD-10-CM | POA: Diagnosis present

## 2015-12-18 DIAGNOSIS — R0600 Dyspnea, unspecified: Secondary | ICD-10-CM | POA: Diagnosis not present

## 2015-12-18 DIAGNOSIS — J441 Chronic obstructive pulmonary disease with (acute) exacerbation: Secondary | ICD-10-CM | POA: Diagnosis not present

## 2015-12-18 DIAGNOSIS — R0602 Shortness of breath: Secondary | ICD-10-CM | POA: Diagnosis present

## 2015-12-18 DIAGNOSIS — Z950 Presence of cardiac pacemaker: Secondary | ICD-10-CM | POA: Diagnosis present

## 2015-12-18 DIAGNOSIS — I509 Heart failure, unspecified: Secondary | ICD-10-CM

## 2015-12-18 DIAGNOSIS — I5042 Chronic combined systolic (congestive) and diastolic (congestive) heart failure: Secondary | ICD-10-CM | POA: Diagnosis not present

## 2015-12-18 DIAGNOSIS — I10 Essential (primary) hypertension: Secondary | ICD-10-CM | POA: Diagnosis present

## 2015-12-18 DIAGNOSIS — E1169 Type 2 diabetes mellitus with other specified complication: Secondary | ICD-10-CM | POA: Diagnosis present

## 2015-12-18 DIAGNOSIS — Z87891 Personal history of nicotine dependence: Secondary | ICD-10-CM | POA: Diagnosis not present

## 2015-12-18 DIAGNOSIS — J209 Acute bronchitis, unspecified: Secondary | ICD-10-CM | POA: Diagnosis present

## 2015-12-18 DIAGNOSIS — J9621 Acute and chronic respiratory failure with hypoxia: Secondary | ICD-10-CM | POA: Diagnosis present

## 2015-12-18 LAB — CBC
HCT: 43.4 % (ref 39.0–52.0)
Hemoglobin: 15.2 g/dL (ref 13.0–17.0)
MCH: 31.3 pg (ref 26.0–34.0)
MCHC: 35 g/dL (ref 30.0–36.0)
MCV: 89.3 fL (ref 78.0–100.0)
Platelets: 187 10*3/uL (ref 150–400)
RBC: 4.86 MIL/uL (ref 4.22–5.81)
RDW: 13.2 % (ref 11.5–15.5)
WBC: 15.5 10*3/uL — ABNORMAL HIGH (ref 4.0–10.5)

## 2015-12-18 LAB — I-STAT ARTERIAL BLOOD GAS, ED
Acid-base deficit: 1 mmol/L (ref 0.0–2.0)
Bicarbonate: 26.7 mEq/L — ABNORMAL HIGH (ref 20.0–24.0)
O2 Saturation: 99 %
Patient temperature: 98.6
TCO2: 28 mmol/L (ref 0–100)
pCO2 arterial: 55.9 mmHg — ABNORMAL HIGH (ref 35.0–45.0)
pH, Arterial: 7.288 — ABNORMAL LOW (ref 7.350–7.450)
pO2, Arterial: 182 mmHg — ABNORMAL HIGH (ref 80.0–100.0)

## 2015-12-18 LAB — COMPREHENSIVE METABOLIC PANEL
ALT: 15 U/L — ABNORMAL LOW (ref 17–63)
AST: 26 U/L (ref 15–41)
Albumin: 4.3 g/dL (ref 3.5–5.0)
Alkaline Phosphatase: 71 U/L (ref 38–126)
Anion gap: 10 (ref 5–15)
BUN: 11 mg/dL (ref 6–20)
CO2: 24 mmol/L (ref 22–32)
Calcium: 8.4 mg/dL — ABNORMAL LOW (ref 8.9–10.3)
Chloride: 102 mmol/L (ref 101–111)
Creatinine, Ser: 0.84 mg/dL (ref 0.61–1.24)
GFR calc Af Amer: >60 mL/min (ref >60)
GFR calc non Af Amer: >60 mL/min (ref >60)
Glucose, Bld: 211 mg/dL — ABNORMAL HIGH (ref 65–99)
Potassium: 4.1 mmol/L (ref 3.5–5.1)
Sodium: 136 mmol/L (ref 135–145)
Total Bilirubin: 1.1 mg/dL (ref 0.3–1.2)
Total Protein: 7.8 g/dL (ref 6.5–8.1)

## 2015-12-18 LAB — BRAIN NATRIURETIC PEPTIDE: B Natriuretic Peptide: 171.3 pg/mL — ABNORMAL HIGH (ref 0.0–100.0)

## 2015-12-18 LAB — TROPONIN I: Troponin I: <0.03 ng/mL (ref <0.031)

## 2015-12-18 MED ORDER — FUROSEMIDE 10 MG/ML IJ SOLN
60.0000 mg | Freq: Once | INTRAMUSCULAR | Status: AC
Start: 2015-12-18 — End: 2015-12-18
  Administered 2015-12-18: 60 mg via INTRAVENOUS
  Filled 2015-12-18: qty 6

## 2015-12-18 MED ORDER — ALBUTEROL (5 MG/ML) CONTINUOUS INHALATION SOLN
10.0000 mg/h | INHALATION_SOLUTION | Freq: Once | RESPIRATORY_TRACT | Status: AC
  Administered 2015-12-18: 10 mg/h via RESPIRATORY_TRACT
  Filled 2015-12-18: qty 20

## 2015-12-18 MED ORDER — METHYLPREDNISOLONE SODIUM SUCC 125 MG IJ SOLR
125.0000 mg | Freq: Once | INTRAMUSCULAR | Status: AC
  Administered 2015-12-18: 125 mg via INTRAVENOUS
  Filled 2015-12-18: qty 2

## 2015-12-18 MED ORDER — IPRATROPIUM BROMIDE 0.02 % IN SOLN
0.5000 mg | Freq: Once | RESPIRATORY_TRACT | Status: AC
  Administered 2015-12-18: 0.5 mg via RESPIRATORY_TRACT
  Filled 2015-12-18: qty 2.5

## 2015-12-18 NOTE — Progress Notes (Signed)
Patient ID: Bradley Sit., male   DOB: 1941/02/06, 75 y.o.   MRN: [id number]   Currently on BiPAP due to COPD/CHF exacerbation.  Accepted to Stepdown bed.  Please call extension: [phone] for admitting physician assignment.   Per Dr. Lynelle Doctor  Chief Complaint  Patient presents with  . Shortness of Breath   HPI HPI Comments: Bradley TORRUELLA Sr. is a 75 y.o. male with a hx of COPD, CAD, AFIB, HTM, HLD, and pacemaker placement, who presents to the Emergency Department cyanotic with labored respirations and an oxygen saturation of 60%, complaining of onset of SOB two days ago. Pt also reports associated wheezing, frequent cough, and recent sick contact. His oxygen saturation is currently 100% on 15L/min in the ED. He notes that he uses oxygen and breathing treatments at home as needed. Pt also reports that he used to be a smoker. He denies known any known fever.            Component Value Units   Brain natriuretic peptide [[id number]] (Abnormal) Collected: 12/18/15 2200   Updated: 12/18/15 2236    Specimen Type: Blood     B Natriuretic Peptide 171.3 (H) pg/mL   Troponin I [[id number]] Collected: 12/18/15 2200   Updated: 12/18/15 2234    Specimen Type: Blood     Troponin I <0.03 ng/mL   Comprehensive metabolic panel [[id number]] (Abnormal) Collected: 12/18/15 2200   Updated: 12/18/15 2218    Specimen Type: Blood     Sodium 136 mmol/L    Potassium 4.1 mmol/L    Chloride 102 mmol/L    CO2 24 mmol/L    Glucose, Bld 211 (H) mg/dL    BUN 11 mg/dL    Creatinine, Ser 4.26 mg/dL    Calcium 8.4 (L) mg/dL    Total Protein 7.8 g/dL    Albumin 4.3 g/dL    AST 26 U/L    ALT 15 (L) U/L    Alkaline Phosphatase 71 U/L    Total Bilirubin 1.1 mg/dL    GFR calc non Af Amer >60 mL/min    GFR calc Af Amer >60 mL/min    Anion gap 10   CBC [[id number]] (Abnormal) Collected: 12/18/15 2200   Updated: 12/18/15 2211    Specimen Type: Blood     WBC 15.5 (H) K/uL     RBC 4.86 MIL/uL    Hemoglobin 15.2 g/dL    HCT 97.9 %    MCV 89.2 fL    MCH 31.3 pg    MCHC 35.0 g/dL    RDW 11.9 %    Platelets 187 K/uL   I-Stat arterial blood gas, ED [facility]) [[id number]] (Abnormal) Collected: 12/18/15 2138   Updated: 12/18/15 2148    Specimen Type: Blood     pH, Arterial 7.288 (L)    pCO2 arterial 55.9 (H) mmHg    pO2, Arterial 182.0 (H) mmHg    Bicarbonate 26.7 (H) mEq/L    TCO2 28 mmol/L    O2 Saturation 99.0 %    Acid-base deficit 1.0 mmol/L    Patient temperature 98.6 F    Collection site RADIAL, ALLEN'S TEST ACCEPTABLE    Drawn by RT    Sample type ARTERIAL     EKG  Vent. rate 72 BPM PR interval 153 ms QRS duration 149 ms QT/QTc 411/450 ms P-R-T axes 0 257 84 Undetermined rhythm Ventricular premature complex Right bundle branch block Inferior infarct, old Abnormal lateral Q waves Anterior infarct, old Poor data quality Otherwise no significant  change  CLINICAL DATA: Short of breath  EXAM: PORTABLE CHEST 1 VIEW  COMPARISON: 08/22/2014  FINDINGS: Mild cardiac enlargement with vascular congestion and interstitial edema. Small bilateral effusions. Pacemaker with 2 leads in the right ventricle unchanged. Negative for pneumonia.  IMPRESSION: Congestive heart failure with mild edema and small pleural Effusions.  Sanda Klein, MD

## 2015-12-18 NOTE — ED Provider Notes (Signed)
CSN: [id number]     Arrival date & time 12/18/15  2118 History  By signing my name below, I, Bradley [name], attest that this documentation has been prepared under the direction and in the presence of Linwood Dibbles, MD. Electronically Signed: Gonzella [name], Scribe. 12/18/2015. 9:38 PM.   Chief Complaint  Patient presents with  . Shortness of Breath   HPI HPI Comments: SHIHAB STATES Sr. is a 75 y.o. male with a hx of COPD, CAD, AFIB, HTM, HLD, and pacemaker placement, who presents to the Emergency Department cyanotic with labored respirations and an oxygen saturation of 60%, complaining of onset of SOB  two days ago. Pt also reports associated wheezing, frequent cough, and recent sick contact. His oxygen saturation is currently 100% on 15L/min in the ED. He notes that he uses oxygen and breathing treatments at home as needed. Pt also reports that he used to be a smoker. He denies known any known fever.   Past Medical History  Diagnosis Date  . HTN (hypertension)   . HLD (hyperlipidemia)   . BPH (benign prostatic hyperplasia)     Saw Dr [name] 2004, normal renal u/s  . Pleural effusion 2008    S/p decortication  . Insomnia   . Atrial fibrillation (HCC)     s/p AV node ablation & BiV PPM implantation 09/08/11 (op dictation pending)  . Pulmonary HTN (HCC)     per cath 2008  . Peripheral vascular disease (HCC)     ??  . Anemia     intermittent  . Pacemaker   . Heart murmur   . Type II diabetes mellitus (HCC) 1999  . On home oxygen therapy     at night as needed  . 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 CAD (coronary artery disease)   . Abnormal CT scan, chest 2012    CT chest, several lymphadenopathies. Sees pulmonary  . CHF (congestive heart failure) (HCC)     Thought primarily to be non-systolic although EF down (EF 40-98% 12/2010, down to 35-40% 09/05/11), cath 2008 with no CAD, nuclear study 07/2011 showing Small area of reversibility in  the distal ant/lat wall the left ventricle suspicious for ischemia/septal wall HK but felt to be low risk  (per D/C Summary 07/2011)  . Chronic bronchitis (HCC)     "get it ~ q yr"  . Migraines     "very very rare"  . Arthritis     "?back" (08/22/2014)   Past Surgical History  Procedure Laterality Date  . Pleural scarification    . Lung decortication    . Colonoscopy  03/10/11    normal  . Pneumothorax with fibrothorax  ~ 2010  . Insert / replace / remove pacemaker  09/08/11    pacemaker placement  . Tonsillectomy      "as a kid" (09/27/2012)  . Cardiac catheterization      "couple times; never had balloon or stent" (09/27/2012)  . Bi-ventricular pacemaker insertion Left 09/08/2011    Procedure: BI-VENTRICULAR PACEMAKER INSERTION (CRT-P);  Surgeon: Marinus Maw, MD;  Location: [facility] CATH LAB;  Service: Cardiovascular;  Laterality: Left;   Family History  Problem Relation Age of Onset  . Diabetes Father   . Breast cancer Maternal Aunt   . Prostate cancer Neg Hx   . Colon cancer Neg Hx   . Coronary artery disease Father   . Polycythemia Mother    Social History  Substance Use  Topics  . Smoking status: Former Smoker -- 2.50 packs/day for 25 years    Types: Cigarettes    Quit date: 10/11/1980  . Smokeless tobacco: Never Used     Comment: used to smoke 2.5 ppd for 25 years  . Alcohol Use: Yes     Comment: 08/22/2014 "maybe q couple months I'll have a beer w/dinner"    Review of Systems  Constitutional: Negative for fever.  Respiratory: Positive for cough, shortness of breath and wheezing.   A complete 10 system review of systems was obtained and all systems are negative except as noted in the HPI and PMH.   Allergies  Hydrocodone; Tramadol; Tadalafil; and Avelox  Home Medications   Prior to Admission medications   Medication Sig Start Date End Date Taking? Authorizing Provider  albuterol (PROVENTIL HFA;VENTOLIN HFA) 108 (90 BASE) MCG/ACT inhaler Inhale 2 puffs into  the lungs every 4 (four) hours as needed for wheezing or shortness of breath. 08/25/14   Lonia Blood, MD  amLODipine (NORVASC) 10 MG tablet Take 1 tablet (10 mg total) by mouth daily. 07/24/15   [name], MD  aspirin 81 MG tablet Take 81 mg by mouth as needed.     Historical Provider, MD  carvedilol (COREG) 25 MG tablet Take 1.5 tablets (37.5 mg total) by mouth 2 (two) times daily with a meal. 07/22/15   [name], MD  furosemide (LASIX) 40 MG tablet Take 1 1/2 tablets by mouth twice daily and as needed Patient taking differently: Take 80 mg by mouth 2 (two) times daily.  10/15/14   Marinus Maw, MD  furosemide (LASIX) 40 MG tablet TAKE 2 TABLETS TWICE A DAY 09/25/15   Marinus Maw, MD  glucose blood (ONE TOUCH ULTRA TEST) test strip Check blood sugar no more than twice daily. Patient not taking: Reported on 07/15/2015 06/06/15   [name], MD  losartan (COZAAR) 100 MG tablet Take 1 tablet (100 mg total) by mouth daily. 09/11/15   [name], MD  lovastatin (MEVACOR) 20 MG tablet Take 1 tablet (20 mg total) by mouth at bedtime. 09/25/15   [name], MD  Multiple Vitamins-Minerals (MULTIVITAMINS THER. W/MINERALS) TABS tablet Take 1 tablet by mouth daily. 09/14/13   [name], MD  niacin (NIASPAN) 1000 MG CR tablet Take 1 tablet (1,000 mg total) by mouth at bedtime. 09/25/15   [name], MD  nitroGLYCERIN (NITROSTAT) 0.4 MG SL tablet Place 1 tablet (0.4 mg total) under the tongue every 5 (five) minutes as needed for chest pain. Patient not taking: Reported on 07/15/2015 03/19/15   [name], MD  pantoprazole (PROTONIX) 40 MG tablet Take 1 tablet (40 mg total) by mouth every morning. 07/02/15   [name], MD  sitaGLIPtin (JANUVIA) 100 MG tablet Take 1 tablet (100 mg total) by mouth daily. 09/22/15   [name], MD  tamsulosin (FLOMAX) 0.4 MG CAPS capsule Take 1 capsule (0.4 mg total) by mouth daily. 08/26/15   [name], MD  zolpidem (AMBIEN) 10 MG tablet Take 1 tablet (10 mg total) by mouth  at bedtime as needed. For sleep 04/21/15   [name], MD   BP 118/82 mmHg  Pulse 69  Temp(Src) 98.7 F (37.1 C) (Oral)  Resp 24  SpO2 95% Physical Exam  Constitutional: No distress.  HENT:  Head: Normocephalic and atraumatic.  Right Ear: External ear normal.  Left Ear: External ear normal.  Eyes:  Conjunctivae are normal. Right eye exhibits no discharge. Left eye exhibits no discharge. No scleral icterus.  Neck: Neck supple. No tracheal deviation present.  Cardiovascular: Normal rate, regular rhythm and intact distal pulses.   Pulmonary/Chest: Accessory muscle usage present. No stridor. Tachypnea noted. No respiratory distress. He has decreased breath sounds. He has wheezes. He has rhonchi. He has no rales.  Abdominal: Soft. Bowel sounds are normal. He exhibits no distension. There is no tenderness. There is no rebound and no guarding.  Musculoskeletal: He exhibits edema. He exhibits no tenderness.  Neurological: He is alert. He has normal strength. No cranial nerve deficit (no facial droop, extraocular movements intact, no slurred speech) or sensory deficit. He exhibits normal muscle tone. He displays no seizure activity. Coordination normal.  Skin: Skin is warm and dry. No rash noted.  Psychiatric: He has a normal mood and affect.  Nursing note and vitals reviewed.   ED Course  Procedures   CRITICAL CARE Performed by: ZOXWR,UEA Total critical care time: 35 minutes Critical care time was exclusive of separately billable procedures and treating other patients. Critical care was necessary to treat or prevent imminent or life-threatening deterioration. Critical care was time spent personally by me on the following activities: development of treatment plan with patient and/or surrogate as well as nursing, discussions with consultants, evaluation of patient's response to treatment, examination of patient, obtaining history from patient or surrogate, ordering and performing treatments and  interventions, ordering and review of laboratory studies, ordering and review of radiographic studies, pulse oximetry and re-evaluation of patient's condition.  DIAGNOSTIC STUDIES:    Oxygen Saturation is 60% on RA, critically low by my interpretation.  COORDINATION OF CARE:  9:32 PM Will order blood tests, EKG, and chest port x-ray. Will administer pt albuterol, methylprednisolone, and ipratropium nebulizer solution in the ED. Discussed treatment plan with pt at bedside and pt agreed to plan.   1123 pm  Responded well to BIPAP treatment.  Lasix IV ordered.  Discussed plan for hospital admission  Labs Review Labs Reviewed  CBC - Abnormal; Notable for the following:    WBC 15.5 (*)    All other components within normal limits  COMPREHENSIVE METABOLIC PANEL - Abnormal; Notable for the following:    Glucose, Bld 211 (*)    Calcium 8.4 (*)    ALT 15 (*)    All other components within normal limits  BRAIN NATRIURETIC PEPTIDE - Abnormal; Notable for the following:    B Natriuretic Peptide 171.3 (*)    All other components within normal limits  I-STAT ARTERIAL BLOOD GAS, ED - Abnormal; Notable for the following:    pH, Arterial 7.288 (*)    pCO2 arterial 55.9 (*)    pO2, Arterial 182.0 (*)    Bicarbonate 26.7 (*)    All other components within normal limits  TROPONIN I   Imaging Review Dg Chest Port 1 View  12/18/2015  CLINICAL DATA:  Short of breath EXAM: PORTABLE CHEST 1 VIEW COMPARISON:  08/22/2014 FINDINGS: Mild cardiac enlargement with vascular congestion and interstitial edema. Small bilateral effusions. Pacemaker with 2 leads in the right ventricle unchanged. Negative for pneumonia. IMPRESSION: Congestive heart failure with mild edema and small pleural effusions. Electronically Signed   By: Marlan Palau M.D.   On: 12/18/2015 22:01   I have personally reviewed and evaluated these images and lab results as part of my medical decision-making.   EKG Interpretation   Date/Time:   Thursday December 18 2015 21:40:08 EST Ventricular  Rate:  72 PR Interval:  153 QRS Duration: 149 QT Interval:  411 QTC Calculation: 450 R Axis:   -103 Text Interpretation:  Undetermined rhythm Ventricular premature complex  Right bundle branch block Inferior infarct, old Abnormal lateral Q waves  Anterior infarct, old Poor data quality Otherwise no significant change  Confirmed by Darian Cansler  MD-J, Esteen Delpriore ([id number]) on 12/18/2015 9:43:33 PM     MDM   Final diagnoses:  Acute on chronic congestive heart failure, unspecified congestive heart failure type (HCC)  Chronic obstructive pulmonary disease with acute exacerbation (HCC)  Acute on chronic respiratory failure with hypoxia and hypercapnia (HCC)   CXR suggests CHF although clinically seems to have component of COPD.  BNP and troponin are normal.  No PNA on xray. ABG correlates with hypercapnic respiratory failure.  Will continue bipap.  Start lasix.  Plan on admission to [facility].   I personally performed the services described in this documentation, which was scribed in my presence.  The recorded information has been reviewed and is accurate.     Linwood Dibbles, MD 12/18/15 2325

## 2015-12-18 NOTE — ED Notes (Signed)
Pt's general color is normal in face now, fingertips are still cyanotic, pt still has labored breathing with tachypnea.  No edema appreciated.  Pt verbalizes mild relief with Bipap

## 2015-12-18 NOTE — ED Notes (Signed)
Pt has hx of COPD- presents with SOB x 2 days, Cyanotic, labored respirations; O2 sats 60-65 at [facility]. Taken to room 14 for triage. RT at bedside

## 2015-12-18 NOTE — ED Notes (Signed)
MD at bedside. 

## 2015-12-19 ENCOUNTER — Other Ambulatory Visit (HOSPITAL_COMMUNITY): Payer: Medicare Other

## 2015-12-19 DIAGNOSIS — J9621 Acute and chronic respiratory failure with hypoxia: Secondary | ICD-10-CM

## 2015-12-19 DIAGNOSIS — E785 Hyperlipidemia, unspecified: Secondary | ICD-10-CM

## 2015-12-19 DIAGNOSIS — I10 Essential (primary) hypertension: Secondary | ICD-10-CM

## 2015-12-19 DIAGNOSIS — J441 Chronic obstructive pulmonary disease with (acute) exacerbation: Secondary | ICD-10-CM

## 2015-12-19 DIAGNOSIS — A419 Sepsis, unspecified organism: Principal | ICD-10-CM

## 2015-12-19 DIAGNOSIS — I5042 Chronic combined systolic (congestive) and diastolic (congestive) heart failure: Secondary | ICD-10-CM

## 2015-12-19 LAB — APTT: aPTT: 25 seconds (ref 24–37)

## 2015-12-19 LAB — GLUCOSE, CAPILLARY
Glucose-Capillary: 170 mg/dL — ABNORMAL HIGH (ref 65–99)
Glucose-Capillary: 313 mg/dL — ABNORMAL HIGH (ref 65–99)

## 2015-12-19 LAB — MRSA PCR SCREENING: MRSA by PCR: NEGATIVE

## 2015-12-19 LAB — TROPONIN I
Troponin I: <0.03 ng/mL (ref <0.031)
Troponin I: <0.03 ng/mL (ref <0.031)
Troponin I: <0.03 ng/mL (ref <0.031)

## 2015-12-19 LAB — LACTIC ACID, PLASMA
Lactic Acid, Venous: 1.2 mmol/L (ref 0.5–2.0)
Lactic Acid, Venous: 1.1 mmol/L (ref 0.5–2.0)

## 2015-12-19 LAB — INFLUENZA PANEL BY PCR (TYPE A & B)
H1N1 flu by pcr: NOT DETECTED
Influenza A By PCR: NEGATIVE
Influenza B By PCR: NEGATIVE

## 2015-12-19 LAB — HIV ANTIBODY (ROUTINE TESTING W REFLEX): HIV Screen 4th Generation wRfx: NONREACTIVE

## 2015-12-19 LAB — PROTIME-INR
INR: 1.13 (ref 0.00–1.49)
Prothrombin Time: 14.6 seconds (ref 11.6–15.2)

## 2015-12-19 LAB — PROCALCITONIN: Procalcitonin: 0.34 ng/mL

## 2015-12-19 MED ORDER — DM-GUAIFENESIN ER 30-600 MG PO TB12
1.0000 | ORAL_TABLET | Freq: Two times a day (BID) | ORAL | Status: DC
  Administered 2015-12-19 – 2015-12-20 (×4): 1 via ORAL
  Filled 2015-12-19 (×4): qty 1

## 2015-12-19 MED ORDER — LOSARTAN POTASSIUM 50 MG PO TABS
100.0000 mg | ORAL_TABLET | Freq: Every day | ORAL | Status: DC
  Administered 2015-12-19 – 2015-12-20 (×2): 100 mg via ORAL
  Filled 2015-12-19 (×2): qty 2

## 2015-12-19 MED ORDER — METHYLPREDNISOLONE SODIUM SUCC 125 MG IJ SOLR
60.0000 mg | Freq: Three times a day (TID) | INTRAMUSCULAR | Status: DC
  Administered 2015-12-19: 60 mg via INTRAVENOUS
  Filled 2015-12-19: qty 2

## 2015-12-19 MED ORDER — NITROGLYCERIN 0.4 MG SL SUBL: 0.4000 mg | SUBLINGUAL_TABLET | SUBLINGUAL | Status: DC | PRN

## 2015-12-19 MED ORDER — ENOXAPARIN SODIUM 40 MG/0.4ML ~~LOC~~ SOLN
40.0000 mg | SUBCUTANEOUS | Status: DC
  Administered 2015-12-19: 40 mg via SUBCUTANEOUS
  Filled 2015-12-19: qty 0.4

## 2015-12-19 MED ORDER — IPRATROPIUM BROMIDE 0.02 % IN SOLN: 0.5000 mg | RESPIRATORY_TRACT | Status: DC

## 2015-12-19 MED ORDER — AZITHROMYCIN 250 MG PO TABS
500.0000 mg | ORAL_TABLET | Freq: Every day | ORAL | Status: AC
  Administered 2015-12-19: 500 mg via ORAL
  Filled 2015-12-19: qty 2

## 2015-12-19 MED ORDER — PRAVASTATIN SODIUM 20 MG PO TABS
20.0000 mg | ORAL_TABLET | Freq: Every day | ORAL | Status: DC
  Administered 2015-12-19: 20 mg via ORAL
  Filled 2015-12-19: qty 1

## 2015-12-19 MED ORDER — INSULIN ASPART 100 UNIT/ML ~~LOC~~ SOLN
0.0000 [IU] | Freq: Every day | SUBCUTANEOUS | Status: DC
  Administered 2015-12-19: 4 [IU] via SUBCUTANEOUS

## 2015-12-19 MED ORDER — IPRATROPIUM BROMIDE 0.02 % IN SOLN
0.5000 mg | Freq: Four times a day (QID) | RESPIRATORY_TRACT | Status: DC
  Administered 2015-12-19 – 2015-12-20 (×5): 0.5 mg via RESPIRATORY_TRACT
  Filled 2015-12-19 (×6): qty 2.5

## 2015-12-19 MED ORDER — NIACIN ER 500 MG PO CPCR
1000.0000 mg | ORAL_CAPSULE | Freq: Every day | ORAL | Status: DC
  Administered 2015-12-19: 1000 mg via ORAL
  Filled 2015-12-19 (×2): qty 2

## 2015-12-19 MED ORDER — INSULIN ASPART 100 UNIT/ML ~~LOC~~ SOLN
0.0000 [IU] | Freq: Three times a day (TID) | SUBCUTANEOUS | Status: DC
  Administered 2015-12-19 – 2015-12-20 (×4): 2 [IU] via SUBCUTANEOUS

## 2015-12-19 MED ORDER — LEVALBUTEROL HCL 1.25 MG/0.5ML IN NEBU: 1.2500 mg | INHALATION_SOLUTION | Freq: Four times a day (QID) | RESPIRATORY_TRACT | Status: DC

## 2015-12-19 MED ORDER — CARVEDILOL 25 MG PO TABS
37.5000 mg | ORAL_TABLET | Freq: Two times a day (BID) | ORAL | Status: DC
  Administered 2015-12-19 – 2015-12-20 (×3): 37.5 mg via ORAL
  Filled 2015-12-19 (×3): qty 1

## 2015-12-19 MED ORDER — FUROSEMIDE 40 MG PO TABS: 60.0000 mg | ORAL_TABLET | Freq: Two times a day (BID) | ORAL | Status: DC

## 2015-12-19 MED ORDER — LEVALBUTEROL HCL 1.25 MG/0.5ML IN NEBU
1.2500 mg | INHALATION_SOLUTION | Freq: Four times a day (QID) | RESPIRATORY_TRACT | Status: DC
  Administered 2015-12-19 – 2015-12-20 (×5): 1.25 mg via RESPIRATORY_TRACT
  Filled 2015-12-19 (×6): qty 0.5

## 2015-12-19 MED ORDER — PANTOPRAZOLE SODIUM 40 MG PO TBEC
40.0000 mg | DELAYED_RELEASE_TABLET | Freq: Every morning | ORAL | Status: DC
  Administered 2015-12-19 – 2015-12-20 (×2): 40 mg via ORAL
  Filled 2015-12-19 (×2): qty 1

## 2015-12-19 MED ORDER — AZITHROMYCIN 250 MG PO TABS
250.0000 mg | ORAL_TABLET | Freq: Every day | ORAL | Status: DC
  Administered 2015-12-20: 250 mg via ORAL
  Filled 2015-12-19: qty 1

## 2015-12-19 MED ORDER — TAMSULOSIN HCL 0.4 MG PO CAPS
0.4000 mg | ORAL_CAPSULE | Freq: Every day | ORAL | Status: DC
  Administered 2015-12-19 – 2015-12-20 (×2): 0.4 mg via ORAL
  Filled 2015-12-19 (×2): qty 1

## 2015-12-19 MED ORDER — ASPIRIN 81 MG PO CHEW
81.0000 mg | CHEWABLE_TABLET | Freq: Every day | ORAL | Status: DC
  Administered 2015-12-19 – 2015-12-20 (×2): 81 mg via ORAL
  Filled 2015-12-19 (×2): qty 1

## 2015-12-19 MED ORDER — PREDNISONE 20 MG PO TABS
20.0000 mg | ORAL_TABLET | Freq: Every day | ORAL | Status: DC
  Administered 2015-12-19 – 2015-12-20 (×2): 20 mg via ORAL
  Filled 2015-12-19 (×2): qty 1

## 2015-12-19 MED ORDER — ZOLPIDEM TARTRATE 5 MG PO TABS
5.0000 mg | ORAL_TABLET | Freq: Every evening | ORAL | Status: DC | PRN
Start: 2015-12-19 — End: 2015-12-20

## 2015-12-19 MED ORDER — ENSURE ENLIVE PO LIQD: 237.0000 mL | Freq: Two times a day (BID) | ORAL | Status: DC

## 2015-12-19 MED ORDER — ADULT MULTIVITAMIN W/MINERALS CH
1.0000 | ORAL_TABLET | Freq: Every day | ORAL | Status: DC
  Administered 2015-12-19 – 2015-12-20 (×2): 1 via ORAL
  Filled 2015-12-19 (×2): qty 1

## 2015-12-19 MED ORDER — HYDRALAZINE HCL 20 MG/ML IJ SOLN: 5.0000 mg | INTRAMUSCULAR | Status: DC | PRN

## 2015-12-19 MED ORDER — FUROSEMIDE 40 MG PO TABS
60.0000 mg | ORAL_TABLET | Freq: Two times a day (BID) | ORAL | Status: DC
  Administered 2015-12-19 – 2015-12-20 (×3): 60 mg via ORAL
  Filled 2015-12-19 (×3): qty 1

## 2015-12-19 MED ORDER — AMLODIPINE BESYLATE 10 MG PO TABS
10.0000 mg | ORAL_TABLET | Freq: Every day | ORAL | Status: DC
  Administered 2015-12-19 – 2015-12-20 (×2): 10 mg via ORAL
  Filled 2015-12-19 (×2): qty 1

## 2015-12-19 NOTE — Progress Notes (Signed)
Patient asking when can he take the BIPAP off and how long he has to wear it.  Patient oxygen saturation high 90's on the BIPAP.  RN updated Dr. Clyde Lundborg and Dr. Clyde Lundborg stated it was ok to wean off the BIPAP at this time.  RN then notified respiratory and respiratory stated to take off BIPAP mask and place patient on 6L per nasal cannula.  RN completed per respiratory's orders.

## 2015-12-19 NOTE — H&P (Signed)
[facility] History and Physical  Bradley [name] ZOX:[id number] DOB: 05-Apr-1941 DOA: 12/18/2015  Referring physician: ED physician PCP: Bradley Ora, MD  Specialists:   Chief Complaint: Cough, shortness of breath  HPI: Bradley [name] Sr. is a 75 y.o. male with PMH of hypertension, hyperlipidemia, diabetes mellitus, severe COPD, GERD, pacemaker placement, BPH, atrial fibrillation (S/P AV node ablation, does not need anticoagulants anymore), PVD, CAD, S/P of CABG, ICH, chronic combined systolic and diastolic congestive heart failure, migraine headaches, who presents with cough, short of breath  Patient reports that he has been having shortness of breath, cough and wheezing in the past 2 days. He coughs up small amount of sputum. He does not have fever, chills. No chest pain. He states that he has been taking his Lasix consistently and his body weight has been stable. He has mild runny nose, but no sore throat. Patient does not have abdominal pain, diarrhea, symptoms of UTI or unilateral weakness. He has trace amount of leg edema.  In ED, patient was found to have WBC 15.5, negative troponin, BNP 171.3, temperature normal, tachycardia, tachypnea, renal function okay, chest x-ray is consistent with CHF with small amount of bilateral pleural effusion. ABG showed pH 7.288, PCO2 55.9, PaO2 182. Patient is admitted to inpatient for further evaluation and treatment.  EKG: Independently reviewed. QTC 450, widening QRS wave  Where does patient live?   At home   Can patient participate in ADLs?  Yes  Review of Systems:   General: no fevers, chills, no changes in body weight, has fatigue HEENT: no blurry vision, hearing changes or sore throat Pulm: has dyspnea, coughing, wheezing CV: no chest pain, no palpitations Abd: no nausea, vomiting, abdominal pain, diarrhea, constipation GU: no dysuria, burning on urination, increased urinary frequency, hematuria  Ext: mild leg edema Neuro: no unilateral  weakness, numbness, or tingling, no vision change or hearing loss Skin: no rash MSK: No muscle spasm, no deformity, no limitation of range of movement in spin Heme: No easy bruising.  Travel history: No recent long distant travel.  Allergy:  Allergies  Allergen Reactions  . Hydrocodone Other (See Comments)    "given to him in the hospital; went thru withdrawals once home; dr said not to take it again" (09/27/2012)  . Tramadol Other (See Comments)    "given to him in the hospital; went thru withdrawals once home; dr said not to take it again" (09/27/2012)  . Tadalafil Other (See Comments)    headache, backache  . Avelox [Moxifloxacin Hydrochloride] Itching and Other (See Comments)    Headache    Past Medical History  Diagnosis Date  . HTN (hypertension)   . HLD (hyperlipidemia)   . BPH (benign prostatic hyperplasia)     Saw Dr [name] 2004, normal renal u/s  . Pleural effusion 2008    S/p decortication  . Insomnia   . Atrial fibrillation (HCC)     s/p AV node ablation & BiV PPM implantation 09/08/11 (op dictation pending)  . Pulmonary HTN (HCC)     per cath 2008  . Peripheral vascular disease (HCC)     ??  . Anemia     intermittent  . Pacemaker   . Heart murmur   . Type II diabetes mellitus (HCC) 1999  . On home oxygen therapy     at night as needed  . 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 CAD (coronary artery disease)   . Abnormal CT  scan, chest 2012    CT chest, several lymphadenopathies. Sees pulmonary  . CHF (congestive heart failure) (HCC)     Thought primarily to be non-systolic although EF down (EF 16-10% 12/2010, down to 35-40% 09/05/11), cath 2008 with no CAD, nuclear study 07/2011 showing Small area of reversibility in the distal ant/lat wall the left ventricle suspicious for ischemia/septal wall HK but felt to be low risk  (per D/C Summary 07/2011)  . Chronic bronchitis (HCC)     "get it ~ q yr"  . Migraines     "very very  rare"  . Arthritis     "?back" (08/22/2014)    Past Surgical History  Procedure Laterality Date  . Pleural scarification    . Lung decortication    . Colonoscopy  03/10/11    normal  . Pneumothorax with fibrothorax  ~ 2010  . Insert / replace / remove pacemaker  09/08/11    pacemaker placement  . Tonsillectomy      "as a kid" (09/27/2012)  . Cardiac catheterization      "couple times; never had balloon or stent" (09/27/2012)  . Bi-ventricular pacemaker insertion Left 09/08/2011    Procedure: BI-VENTRICULAR PACEMAKER INSERTION (CRT-P);  Surgeon: Marinus Maw, MD;  Location: [facility] CATH LAB;  Service: Cardiovascular;  Laterality: Left;    Social History:  reports that he quit smoking about 35 years ago. His smoking use included Cigarettes. He has a 62.5 pack-year smoking history. He has never used smokeless tobacco. He reports that he drinks alcohol. He reports that he does not use illicit drugs.  Family History:  Family History  Problem Relation Age of Onset  . Diabetes Father   . Breast cancer Maternal Aunt   . Prostate cancer Neg Hx   . Colon cancer Neg Hx   . Coronary artery disease Father   . Polycythemia Mother      Prior to Admission medications   Medication Sig Start Date End Date Taking? Authorizing Provider  albuterol (PROVENTIL HFA;VENTOLIN HFA) 108 (90 BASE) MCG/ACT inhaler Inhale 2 puffs into the lungs every 4 (four) hours as needed for wheezing or shortness of breath. 08/25/14   Lonia Blood, MD  amLODipine (NORVASC) 10 MG tablet Take 1 tablet (10 mg total) by mouth daily. 07/24/15   [name], MD  aspirin 81 MG tablet Take 81 mg by mouth as needed.     Historical Provider, MD  carvedilol (COREG) 25 MG tablet Take 1.5 tablets (37.5 mg total) by mouth 2 (two) times daily with a meal. 07/22/15   [name], MD  furosemide (LASIX) 40 MG tablet Take 1 1/2 tablets by mouth twice daily and as needed Patient taking differently: Take 80 mg by mouth 2 (two) times  daily.  10/15/14   Marinus Maw, MD  furosemide (LASIX) 40 MG tablet TAKE 2 TABLETS TWICE A DAY 09/25/15   Marinus Maw, MD  glucose blood (ONE TOUCH ULTRA TEST) test strip Check blood sugar no more than twice daily. Patient not taking: Reported on 07/15/2015 06/06/15   [name], MD  losartan (COZAAR) 100 MG tablet Take 1 tablet (100 mg total) by mouth daily. 09/11/15   [name], MD  lovastatin (MEVACOR) 20 MG tablet Take 1 tablet (20 mg total) by mouth at bedtime. 09/25/15   [name], MD  Multiple Vitamins-Minerals (MULTIVITAMINS THER. W/MINERALS) TABS tablet Take 1 tablet by mouth daily. 09/14/13   [name], MD  niacin (  NIASPAN) 1000 MG CR tablet Take 1 tablet (1,000 mg total) by mouth at bedtime. 09/25/15   [name], MD  nitroGLYCERIN (NITROSTAT) 0.4 MG SL tablet Place 1 tablet (0.4 mg total) under the tongue every 5 (five) minutes as needed for chest pain. Patient not taking: Reported on 07/15/2015 03/19/15   [name], MD  pantoprazole (PROTONIX) 40 MG tablet Take 1 tablet (40 mg total) by mouth every morning. 07/02/15   [name], MD  sitaGLIPtin (JANUVIA) 100 MG tablet Take 1 tablet (100 mg total) by mouth daily. 09/22/15   [name], MD  tamsulosin (FLOMAX) 0.4 MG CAPS capsule Take 1 capsule (0.4 mg total) by mouth daily. 08/26/15   [name], MD  zolpidem (AMBIEN) 10 MG tablet Take 1 tablet (10 mg total) by mouth at bedtime as needed. For sleep 04/21/15   [name], MD    Physical Exam: Filed Vitals:   12/19/15 0130 12/19/15 0247 12/19/15 0250 12/19/15 0311  BP: 127/89   124/79  Pulse: 68 71  69  Temp:    98.6 F (37 C)  TempSrc:    Oral  Resp: 22 23  21   Height:   5\' 9"  (1.753 m)   Weight:   97.342 kg (214 lb 9.6 oz)   SpO2: 98% 99%  100%   General: Not in acute distress HEENT:       Eyes: PERRL, EOMI, no scleral icterus.       ENT: No discharge from the ears and nose, no pharynx injection, no tonsillar enlargement.        Neck: No JVD, no bruit, no mass  felt. Heme: No neck lymph node enlargement. Cardiac: S1/S2, RRR, No murmurs, No gallops or rubs. Pulm: has mild wheezing and rhonchi bilaterally. No rales or rubs. Abd: Soft, nondistended, nontender, no rebound pain, no organomegaly, BS present. Ext: has trace leg edema bilaterally. 2+DP/PT pulse bilaterally. Musculoskeletal: No joint deformities, No joint redness or warmth, no limitation of ROM in spin. Skin: No rashes.  Neuro: Alert, oriented X3, cranial nerves II-XII grossly intact, moves all extremities normally.  Psych: Patient is not psychotic, no suicidal or hemocidal ideation.  Labs on Admission:  Basic Metabolic Panel:  Recent Labs Lab 12/18/15 2200  NA 136  K 4.1  CL 102  CO2 24  GLUCOSE 211*  BUN 11  CREATININE 0.84  CALCIUM 8.4*   Liver Function Tests:  Recent Labs Lab 12/18/15 2200  AST 26  ALT 15*  ALKPHOS 71  BILITOT 1.1  PROT 7.8  ALBUMIN 4.3   No results for input(s): LIPASE, AMYLASE in the last 168 hours. No results for input(s): AMMONIA in the last 168 hours. CBC:  Recent Labs Lab 12/18/15 2200  WBC 15.5*  HGB 15.2  HCT 43.4  MCV 89.3  PLT 187   Cardiac Enzymes:  Recent Labs Lab 12/18/15 2200  TROPONINI <0.03    BNP (last 3 results)  Recent Labs  12/18/15 2200  BNP 171.3*    ProBNP (last 3 results) No results for input(s): PROBNP in the last 8760 hours.  CBG: No results for input(s): GLUCAP in the last 168 hours.  Radiological Exams on Admission: Dg Chest Port 1 View  12/18/2015  CLINICAL DATA:  Short of breath EXAM: PORTABLE CHEST 1 VIEW COMPARISON:  08/22/2014 FINDINGS: Mild cardiac enlargement with vascular congestion and interstitial edema. Small bilateral effusions. Pacemaker with 2 leads in the right ventricle unchanged. Negative for pneumonia. IMPRESSION: Congestive  heart failure with mild edema and small pleural effusions. Electronically Signed   By: Marlan Palau M.D.   On: 12/18/2015 22:01     Assessment/Plan Principal Problem:   Acute on chronic respiratory failure with hypoxia (HCC) Active Problems:   DM II (diabetes mellitus, type II), controlled (HCC)   Hyperlipidemia   Essential hypertension   Chronic pulmonary hypertension secondary to elevated L H pressures    Sinoatrial node dysfunction (HCC)   Pacemaker-[employer]   Acute on chronic combined systolic and diastolic congestive heart failure (HCC)   CAD (coronary artery disease) of artery bypass graft   COPD exacerbation (HCC)   Sepsis (HCC)   Acute on chronic respiratory failure with hypoxia (HCC): This is likely due to COPD exacerbation given wheezing and rhonchi on auscultation. Patient may also have mild CHF exacerbation given chest x-ray findings. Patient has oxygen desaturation to 60% which improved to 99% on BiPAP.  -Admitted to stepdown bed due to the need of BiPAP -Will treat COPD and CHF exacerbation as below -prn BiPAP  COPD exacerbation: Patient has productive cough, shortness breath, wheezing and rhonchi on auscultation, consistent with COPD exacerbation. -Nebulizers: scheduled Atroven and prn Xopenex -Solu-Medrol 60 mg IV tid -Oral azithromycin for 5 days.  -Mucinex for cough  -Urine S. pneumococcal antigen -Follow up blood culture x2, sputum culture, respiratory virus panel, Flu pcr  Sepsis due to COPD exacerbation: Patient meets criteria for sepsis with leukocytosis, tachycardia and tachypnea. Lactate level is pending. Since patient may also have CHF exacerbation. I will hold IV fluid now.  -will get Procalcitonin and trend lactic acid levels per sepsis protocol. - Follow-up lactate level, If lactate level is elevated, will start fluid resuscitation.  Mild CHF exacerbation: Chest x-ray showed pulmonary vascular congestion, consistent with CHF exacerbation. BNP is only 171.3, patient has a trace amount of leg edema. He may have mild CHF exacerbation if any. Patient received 1 dose of Lasix, 60 mg  1 by heavy in the emergency room. Since patient meet criteria for sepsis, I will hold diuretics for now.  - trop x 3 - continue aspirin and Coreg - Will follow-up lactate level, if lactate level is normal, will resume diuretics.  DM-II: Last A1c 5.7 on 07/15/15, well controled. Patient is taking Januvia at home -SSI  HLD: Last LDL was 96 on 03/12/15 -Pravastatin  Essential hypertension: -Continue Coreg, amlodipine, losartan -IV hydralazine when necessary  CAD (coronary artery disease) of artery bypass graft: No chest pain. -Continue aspirin, metoprolol, and statin   DVT ppx: sq Lovenox  Code Status: Full code Family Communication: None at bed side.   Disposition Plan: Admit to inpatient   Date of Service 12/19/2015    Lorretta Harp [facility] Pager [phone]  If 7PM-7AM, please contact night-coverage [email or web address] Password [email or web address] 12/19/2015, 3:58 AM

## 2015-12-19 NOTE — Evaluation (Addendum)
Physical Therapy Evaluation Patient Details Name: Bradley BONNIE Sr. MRN: [id number] DOB: 10-Apr-1941 Today's Date: 12/19/2015   History of Present Illness  Pt is a 75 y/o M admitted due to SOB, wheezing, coughing.  Pt's diagnosis is acute on chronic respiratory failure w/  hypoxia likely due to CHF and COPD exacerbation.  Pt on 2L O2 at home.  Pt's PMH includes pleural effusion, PVD, anemia, pacemaker, ICB.  Clinical Impression  Pt admitted with above diagnosis. Pt currently with functional limitations due to the deficits listed below (see PT Problem List). Today was pt's first time ambulating out of room and demonstrated LOB x1.  Otherwise, requires min guard assist.  SpO2 remained at 91% or higher, RR up to 31.  Pt would benefit greatly from cardiopulmonary rehab at d/c as his main limitation is his poor activity tolerance due to SOB.  Recommending to max out [facility] services as he lives at home w/ his wife who is currently hospitalized as well.  Pt will benefit from skilled PT to increase their independence and safety with mobility to allow discharge to the venue listed below.      Follow Up Recommendations Other (comment);Supervision for mobility/OOB;No PT follow up (Cardiopulmonary rehab; max out [facility] services)    Equipment Recommendations  None recommended by PT    Recommendations for Other Services       Precautions / Restrictions Precautions Precautions: Fall Precaution Comments: monitor O2, RR Restrictions Weight Bearing Restrictions: No      Mobility  Bed Mobility Overal bed mobility: Modified Independent             General bed mobility comments: HOB elevated  Transfers Overall transfer level: Needs assistance Equipment used: None Transfers: Sit to/from Stand Sit to Stand: Supervision         General transfer comment: Pt quick and impulsive to stand w/ min instability but no outside support needed to steady.  Ambulation/Gait Ambulation/Gait assistance: Min  guard Ambulation Distance (Feet): 200 Feet Assistive device: None Gait Pattern/deviations: Step-through pattern;Decreased stride length   Gait velocity interpretation: at or above normal speed for age/gender General Gait Details: SpO2 remains at or above 91% on 2L O2.  RR up to 31 and requires 2 standing rest breaks due to dyspnea.  LOB x1 when turning out of room but pt stabilizes by holding onto door frame.  Denies dizziness.   Stairs            Wheelchair Mobility    Modified Rankin (Stroke Patients Only)       Balance Overall balance assessment: Needs assistance Sitting-balance support: No upper extremity supported;Feet supported Sitting balance-Leahy Scale: Normal     Standing balance support: No upper extremity supported;During functional activity Standing balance-Leahy Scale: Fair               High level balance activites: Other (comment);Head turns;Turns;Backward walking (stepping over object) High Level Balance Comments: Min instability w/ stepping over object             Pertinent Vitals/Pain Pain Assessment: No/denies pain    Home Living Family/patient expects to be discharged to:: Private residence Living Arrangements: Spouse/significant other Available Help at Discharge: Family;Available PRN/intermittently (wife unable to provide physical assist) Type of Home: Other(Comment) (townhouse) Home Access: Level entry     Home Layout: Multi-level;Bed/bath upstairs (bedroom on 3rd floor) Home Equipment: Cane - single point (rollator) Additional Comments: Wife is currently hospitalized as well but is planning to d/c tomorrow, 3/11, to home.  Pt's  sister lives 5 minutes away and available prn.    Prior Function Level of Independence: Independent         Comments: When unloading car pt and wife would go up one flight of stairs at a time, taking breaks on each floor.  PTA pt not using AD but on 2L O2.     Hand Dominance         Extremity/Trunk Assessment   Upper Extremity Assessment: Overall WFL for tasks assessed           Lower Extremity Assessment: Overall WFL for tasks assessed         Communication   Communication: No difficulties  Cognition Arousal/Alertness: Awake/alert Behavior During Therapy: WFL for tasks assessed/performed Overall Cognitive Status: Within Functional Limits for tasks assessed                      General Comments      Exercises        Assessment/Plan    PT Assessment Patient needs continued PT services  PT Diagnosis Difficulty walking   PT Problem List Decreased activity tolerance;Decreased balance;Decreased knowledge of use of DME;Decreased safety awareness;Cardiopulmonary status limiting activity  PT Treatment Interventions DME instruction;Gait training;Stair training;Functional mobility training;Therapeutic activities;Therapeutic exercise;Balance training;Patient/family education   PT Goals (Current goals can be found in the Care Plan section) Acute Rehab PT Goals Patient Stated Goal: to go home PT Goal Formulation: With patient Time For Goal Achievement: 01/02/16 Potential to Achieve Goals: Good    Frequency Min 3X/week   Barriers to discharge Inaccessible home environment;Decreased caregiver support lives w/ wife who will not be able to provide physical assist if needed.  Has three flights of stairs to get to his bedroom.    Co-evaluation               End of Session Equipment Utilized During Treatment: Gait belt;Oxygen Activity Tolerance: Patient tolerated treatment well;Patient limited by fatigue Patient left: with call bell/phone within reach;in bed;with bed alarm set;Other (comment) (pt sitting EOB) Nurse Communication: Mobility status;Other (comment) (SpO2, RR, no recliner chair in room)         Time: 4098-1191 PT Time Calculation (min) (ACUTE ONLY): 23 min   Charges:   PT Evaluation $PT Eval Low Complexity: 1 Procedure PT  Treatments $Gait Training: 8-22 mins   PT G Codes:       Encarnacion Chu PT, DPT  Pager: [phone] Phone: [phone] 12/19/2015, 2:41 PM

## 2015-12-19 NOTE — Progress Notes (Signed)
PROGRESS NOTE  Bradley [name] Sr. HOO:[id number] DOB: 04-06-1941 DOA: 12/18/2015 PCP: [name], MD Outpatient Specialists:    LOS: 1 day   Brief Narrative: 75 year old male with a hx of CHF, CAD, HTN, HLD, and pacemaker placement, who presented cyanotic with labored respirations and an oxygen saturation of 60%, complaining of onset of SOB two days ago. Associated wheezing, frequent cough productive of clear sputum. Oxygen saturation was brought to 100% on 15L/min O2 in the ED. Currently on 3L O2. His baseline is 2L of O2 via nasal cannula at home.   Treatment initiated for COPD exacerbation and CHF. Started on z-pak, mucinex, steroids. Received 1 dose of IV lasix. Chest x-ray showed mild edema with small pleural effusions. Echocardiogram pending.  Assessment & Plan: Principal Problem:   Acute on chronic respiratory failure with hypoxia (HCC) Active Problems:   DM II (diabetes mellitus, type II), controlled (HCC)   Hyperlipidemia   Essential hypertension   Chronic pulmonary hypertension secondary to elevated L H pressures    Sinoatrial node dysfunction (HCC)   Pacemaker-[employer]   Acute on chronic combined systolic and diastolic congestive heart failure (HCC)   CAD (coronary artery disease) of artery bypass graft   Sepsis (HCC)   Acute on chronic respiratory failure with hypoxia - likely due to CHF and COPD exacerbation - initially had O2 desat to 60% which improved to 99% on BiPAP, this has improved and he is now requiring 3L O2 via nasal cannula, close to baseline of 2L at home - will continue to monitor  Acute bronchitis versus upper airway cough syndrome and chronic hypoxemic respiratory failure - Patient suspected to have COPD as an outpatient, previously underwent PFTs followed by [name] from pulmonology, pulmonary function tests without strong evidence for COPD.  - presented with productive cough, shortness of breath, wheezes with previous smoking history (quit 30 years ago),  on home oxygen 2L via [place] - treated with nebulizers, IV steroids, oral azithromycin x 5 days, mucinex - Suspect wheezing is most likely related to perhaps underlying pulmonary edema, resume Lasix - Quick taper of steroids since less likely COPD - influenza negative - urine strep pneumo antigen, blood/sputum cultures, respiratory virus panel pending  Acute on chronic diastolic congestive heart failure - combined systolic/diastolic, chest x-ray showed mild edema with small pleural effusions, pt noted small 2 pounds  weight gain despite eating less, JVD and trace peripheral edema on exam - monitor I & O, sodium restriction, electrolytes, diurese with lasix 60 mg BID (home dose) - echocardiogram pending  Leukocytosis - WBC of 15.5 on presentation, pt remains afebrile, will recheck CBC  Diabetes mellitus type 2 - Hgb A1C 5.7 on 07/15/15, well controlled, continue Januvia at home - SSI while inpatient  Hyperlipidemia - continue home statin  Essential hypertension - controlled, continue home medications  Coronary artery disease - asymptomatic, continue home medications  DVT prophylaxis: lovenox Code Status: full code Family Communication: none at bedside Disposition Plan: remain inpatient Barriers for discharge: echocardiogram results, monitor O2 sats  Consultants:   None  Procedures:   2D echo: pending  Antimicrobials:  azithromycin  Subjective: Currently feels his shortness of breath has improved, on 3L O2 via nasal cannula  Objective: Filed Vitals:   12/19/15 0435 12/19/15 0505 12/19/15 0540 12/19/15 0640  BP:      Pulse:      Temp:      TempSrc:      Resp:      Height:  Weight:      SpO2: 98% 98% 95% 95%    Intake/Output Summary (Last 24 hours) at 12/19/15 0821 Last data filed at 12/19/15 0405  Gross per 24 hour  Intake    100 ml  Output    675 ml  Net   -575 ml   Filed Weights   12/19/15 0250  Weight: 97.342 kg (214 lb 9.6 oz)     Examination: BP 122/76 mmHg  Pulse 70  Temp(Src) 98.6 F (37 C) (Oral)  Resp 13  Ht  (1.753 m)  Wt 97.342 kg (214 lb 9.6 oz)  BMI 31.68 kg/m2  SpO2 95% General exam: NAD Respiratory system: Mild increased work of breathing while conversing, lungs with diminished BS at the bases bilaterally Cardiovascular system: Regular rate and rhythm. Mild JVD. Trace peripheral edema.  Gastrointestinal system: Abdomen is nondistended, soft and nontender. Central nervous system: AxOx3. No focal deficits Extremities: No clubbing/cyanosis Skin: no rashes   Data Reviewed: I have personally reviewed following labs and imaging studies  CBC:  Recent Labs Lab 12/18/15 2200  WBC 15.5*  HGB 15.2  HCT 43.4  MCV 89.3  PLT 187   Basic Metabolic Panel:  Recent Labs Lab 12/18/15 2200  NA 136  K 4.1  CL 102  CO2 24  GLUCOSE 211*  BUN 11  CREATININE 0.84  CALCIUM 8.4*   GFR: Estimated Creatinine Clearance: 88.7 mL/min (by C-G formula based on Cr of 0.84). Liver Function Tests:  Recent Labs Lab 12/18/15 2200  AST 26  ALT 15*  ALKPHOS 71  BILITOT 1.1  PROT 7.8  ALBUMIN 4.3   Coagulation Profile:  Recent Labs Lab 12/19/15 0445  INR 1.13   Cardiac Enzymes:  Recent Labs Lab 12/18/15 2200 12/19/15 0445  TROPONINI <0.03 <0.03   Urine analysis:    Component Value Date/Time   COLORURINE YELLOW 07/15/2015 1440   APPEARANCEUR CLEAR 07/15/2015 1440   LABSPEC 1.015 07/15/2015 1440   PHURINE 6.0 07/15/2015 1440   GLUCOSEU NEGATIVE 07/15/2015 1440   GLUCOSEU NEGATIVE 07/06/2012 0341   HGBUR TRACE-LYSED* 07/15/2015 1440   BILIRUBINUR NEGATIVE 07/15/2015 1440   KETONESUR TRACE* 07/15/2015 1440   PROTEINUR NEGATIVE 07/06/2012 0341   UROBILINOGEN 1.0 07/15/2015 1440   NITRITE NEGATIVE 07/15/2015 1440   LEUKOCYTESUR NEGATIVE 07/15/2015 1440   Sepsis Labs: Invalid input(s): PROCALCITONIN, LACTICIDVEN  Recent Results (from the past 240 hour(s))  MRSA PCR Screening      Status: None   Collection Time: 12/19/15  2:38 AM  Result Value Ref Range Status   MRSA by PCR NEGATIVE NEGATIVE Final    Comment:        The GeneXpert MRSA Assay (FDA approved for NASAL specimens only), is one component of a comprehensive MRSA colonization surveillance program. It is not intended to diagnose MRSA infection nor to guide or monitor treatment for MRSA infections.       Radiology Studies: Dg Chest Port 1 View  12/18/2015  CLINICAL DATA:  Short of breath EXAM: PORTABLE CHEST 1 VIEW COMPARISON:  08/22/2014 FINDINGS: Mild cardiac enlargement with vascular congestion and interstitial edema. Small bilateral effusions. Pacemaker with 2 leads in the right ventricle unchanged. Negative for pneumonia. IMPRESSION: Congestive heart failure with mild edema and small pleural effusions. Electronically Signed   By: Marlan Palau M.D.   On: 12/18/2015 22:01     Scheduled Meds: . amLODipine  10 mg Oral Daily  . aspirin  81 mg Oral Daily  . azithromycin  500 mg Oral  Daily   Followed by  . [START ON 12/20/2015] azithromycin  250 mg Oral Daily  . carvedilol  37.5 mg Oral BID WC  . dextromethorphan-guaiFENesin  1 tablet Oral BID  . enoxaparin (LOVENOX) injection  40 mg Subcutaneous Q24H  . feeding supplement (ENSURE ENLIVE)  237 mL Oral BID BM  . insulin aspart  0-5 Units Subcutaneous QHS  . insulin aspart  0-9 Units Subcutaneous TID WC  . ipratropium  0.5 mg Nebulization Q6H  . levalbuterol  1.25 mg Nebulization Q6H  . losartan  100 mg Oral Daily  . methylPREDNISolone sodium succinate  60 mg Intravenous 3 times per day  . multivitamin with minerals  1 tablet Oral Daily  . niacin  1,000 mg Oral QHS  . pantoprazole  40 mg Oral q morning - 10a  . pravastatin  20 mg Oral q1800  . tamsulosin  0.4 mg Oral Daily   Continuous Infusions:      Theodis Blaze PA-S Pamella Pert, MD, PhD [facility] Pager [phone]  If 7PM-7AM, please contact  night-coverage [email or web address] Password [email or web address] 12/19/2015, 8:21 AM

## 2015-12-19 NOTE — Plan of Care (Signed)
Problem: Physical Regulation: Goal: Ability to maintain clinical measurements within normal limits will improve Outcome: Progressing Per previous charting patient's capillary refill was greater than three seconds earlier and bilateral fingertips were cyanotic.  During this RN assessment patient capillary refill less than three seconds in fingers and toes and patients fingers and toes were pink and warm to touch.

## 2015-12-19 NOTE — Progress Notes (Signed)
UR Completed Keimya Briddell Graves-Bigelow, RN,BSN [phone]  

## 2015-12-19 NOTE — ED Notes (Signed)
Pt states he is feeling better  Respirations not as laborde  Color is normal

## 2015-12-19 NOTE — Care Management Note (Signed)
Case Management Note  Patient Details  Name: Bradley HUGER Sr. MRN: [id number] Date of Birth: January 25, 1941  Subjective/Objective:     Pt admitted from home for CHF vs COPD Exacerbation. Pt is from home with wife. Wife is currently Hospitalized as well.            Action/Plan: Pt will need PT/OT Consult before d/c. If the plan is for home pt will need [facility] RN Services. Referral given to CM for COPD  GOLD Protocol.  CM will continue to monitor.    Expected Discharge Date:                  Expected Discharge Plan:  Home w Home Health Services  In-House Referral:     Discharge planning Services  CM Consult  Post Acute Care Choice:    Choice offered to:     DME Arranged:    DME Agency:     HH Arranged:    HH Agency:     Status of Service:  In process, will continue to follow  Medicare Important Message Given:    Date Medicare IM Given:    Medicare IM give by:    Date Additional Medicare IM Given:    Additional Medicare Important Message give by:     If discussed at Long Length of Stay Meetings, dates discussed:    Additional Comments:  Gala Lewandowsky, RN 12/19/2015, 1:10 PM

## 2015-12-19 NOTE — Progress Notes (Signed)
Nutrition Brief Note   RD consulted for COPD gold.   Pt's weight per chart is stable. Pt reports he has lost 5 lb, actively trying to loose weight.   Pt's BMI is 31.8 which classifies him as obese  Pt reports he consumes 3 meals daily PTA. Pt reports a good appetite since admissions. Pt states he is actively trying to loose weight. He is not following a set diet, he is practicing portion control. Pt reports feeling full even with consumption of smaller portion sizes. Pt reports he has lost 5 lb since starting portion controls. Pt's PO is adequate will discontinue Ensure Enlive. Pt requested bedtime snack of pudding, will provide pudding.   Pt currently on a 2 gram sodium diet, consuming 100% of meals.   Reviewed medications; multivitamin 1 tablet daily. Labs reviewed.  No nutrition interventions warranted at this time. If further nutrition interventions needed consult RD.  Brynda Rim, Dietetic Intern Pager: [phone]

## 2015-12-20 ENCOUNTER — Inpatient Hospital Stay (HOSPITAL_COMMUNITY): Payer: Medicare Other

## 2015-12-20 DIAGNOSIS — R0600 Dyspnea, unspecified: Secondary | ICD-10-CM

## 2015-12-20 LAB — BASIC METABOLIC PANEL
Anion gap: 8 (ref 5–15)
BUN: 19 mg/dL (ref 6–20)
CO2: 28 mmol/L (ref 22–32)
Calcium: 8.7 mg/dL — ABNORMAL LOW (ref 8.9–10.3)
Chloride: 103 mmol/L (ref 101–111)
Creatinine, Ser: 0.94 mg/dL (ref 0.61–1.24)
GFR calc Af Amer: >60 mL/min (ref >60)
GFR calc non Af Amer: >60 mL/min (ref >60)
Glucose, Bld: 204 mg/dL — ABNORMAL HIGH (ref 65–99)
Potassium: 4.9 mmol/L (ref 3.5–5.1)
Sodium: 139 mmol/L (ref 135–145)

## 2015-12-20 LAB — GLUCOSE, CAPILLARY
Glucose-Capillary: 193 mg/dL — ABNORMAL HIGH (ref 65–99)
Glucose-Capillary: 173 mg/dL — ABNORMAL HIGH (ref 65–99)

## 2015-12-20 LAB — ECHOCARDIOGRAM COMPLETE
Height: 69 in
Weight: 3460.8 oz

## 2015-12-20 MED ORDER — ALBUTEROL SULFATE HFA 108 (90 BASE) MCG/ACT IN AERS: 2.0000 | INHALATION_SPRAY | RESPIRATORY_TRACT | Status: DC | PRN

## 2015-12-20 MED ORDER — AZITHROMYCIN 250 MG PO TABS: 250.0000 mg | ORAL_TABLET | Freq: Every day | ORAL | Status: DC

## 2015-12-20 MED ORDER — DM-GUAIFENESIN ER 30-600 MG PO TB12: 1.0000 | ORAL_TABLET | Freq: Two times a day (BID) | ORAL | Status: DC | PRN

## 2015-12-20 MED ORDER — PREDNISONE 20 MG PO TABS: 20.0000 mg | ORAL_TABLET | Freq: Every day | ORAL | Status: DC

## 2015-12-20 NOTE — Discharge Instructions (Signed)
Follow with Kathlene November, MD in 5-7 days  Please get a complete blood count and chemistry panel checked by your Primary MD at your next visit, and again as instructed by your Primary MD. Please get your medications reviewed and adjusted by your Primary MD.  Please request your Primary MD to go over all Hospital Tests and Procedure/Radiological results at the follow up, please get all Hospital records sent to your Prim MD by signing hospital release before you go home.  If you had Pneumonia of Lung problems at the Hospital: Please get a 2 view Chest X ray done in 6-8 weeks after hospital discharge or sooner if instructed by your Primary MD.  If you have Congestive Heart Failure: Please call your Cardiologist or Primary MD anytime you have any of the following symptoms:  1) 3 pound weight gain in 24 hours or 5 pounds in 1 week  2) shortness of breath, with or without a dry hacking cough  3) swelling in the hands, feet or stomach  4) if you have to sleep on extra pillows at night in order to breathe  Follow cardiac low salt diet and 1.5 lit/day fluid restriction.  If you have diabetes Accuchecks 4 times/day, Once in AM empty stomach and then before each meal. Log in all results and show them to your primary doctor at your next visit. If any glucose reading is under 80 or above 300 call your primary MD immediately.  If you have Seizure/Convulsions/Epilepsy: Please do not drive, operate heavy machinery, participate in activities at heights or participate in high speed sports until you have seen by Primary MD or a Neurologist and advised to do so again.  If you had Gastrointestinal Bleeding: Please ask your Primary MD to check a complete blood count within one week of discharge or at your next visit. Your endoscopic/colonoscopic biopsies that are pending at the time of discharge, will also need to followed by your Primary MD.  Get Medicines reviewed and adjusted. Please take all your medications  with you for your next visit with your Primary MD  Please request your Primary MD to go over all hospital tests and procedure/radiological results at the follow up, please ask your Primary MD to get all Hospital records sent to his/her office.  If you experience worsening of your admission symptoms, develop shortness of breath, life threatening emergency, suicidal or homicidal thoughts you must seek medical attention immediately by calling 911 or calling your MD immediately  if symptoms less severe.  You must read complete instructions/literature along with all the possible adverse reactions/side effects for all the Medicines you take and that have been prescribed to you. Take any new Medicines after you have completely understood and accpet all the possible adverse reactions/side effects.   Do not drive or operate heavy machinery when taking Pain medications.   Do not take more than prescribed Pain, Sleep and Anxiety Medications  Special Instructions: If you have smoked or chewed Tobacco  in the last 2 yrs please stop smoking, stop any regular Alcohol  and or any Recreational drug use.  Wear Seat belts while driving.  Please note You were cared for by a hospitalist during your hospital stay. If you have any questions about your discharge medications or the care you received while you were in the hospital after you are discharged, you can call the unit and asked to speak with the hospitalist on call if the hospitalist that took care of you is not available. Once  you are discharged, your primary care physician will handle any further medical issues. Please note that NO REFILLS for any discharge medications will be authorized once you are discharged, as it is imperative that you return to your primary care physician (or establish a relationship with a primary care physician if you do not have one) for your aftercare needs so that they can reassess your need for medications and monitor your lab  values.  You can reach the hospitalist office at phone [phone] or fax [phone]   If you do not have a primary care physician, you can call [phone] for a physician referral.  Activity: As tolerated with Full fall precautions use walker/cane & assistance as needed  Diet: heart healthy  Disposition Home

## 2015-12-20 NOTE — Discharge Summary (Signed)
Physician Discharge Summary  Bradley HANAWALT Sr. ZOX:[id number] DOB: [birth year] DOA: 12/18/2015  PCP: [name], MD  Admit date: 12/18/2015 Discharge date: 12/20/2015  Time spent: > 30 minutes  Recommendations for Outpatient Follow-up:  1. Follow up with [name] in 2-4 weeks 2. Continue Zpack and steroids for 3 additional days   Discharge Diagnoses:  Principal Problem:   Acute on chronic respiratory failure with hypoxia (HCC) Active Problems:   DM II (diabetes mellitus, type II), controlled (HCC)   Hyperlipidemia   Essential hypertension   Chronic pulmonary hypertension secondary to elevated L H pressures    Sinoatrial node dysfunction (HCC)   Pacemaker-Medtronic   Acute on chronic combined systolic and diastolic congestive heart failure (HCC)   CAD (coronary artery disease) of artery bypass graft   Sepsis [facility])  Discharge Condition: stable  Diet recommendation: heart healthy  Filed Weights   12/19/15 0250 12/20/15 0613  Weight: 97.342 kg (214 lb 9.6 oz) 98.113 kg (216 lb 4.8 oz)    History of present illness:  See H&P, Labs, Consult and Test reports for all details in brief, patient is a 75 year old male with a hx of CHF, CAD, HTN, HLD, and pacemaker placement, who presented cyanotic with labored respirations and an oxygen saturation of 60%, complaining of onset of SOB two days ago. Associated wheezing, frequent cough productive of clear sputum. Oxygen saturation was brought to 100% on 15L/min O2 in the ED. Currently on 3L O2. His baseline is 2L of O2 via nasal cannula at home.   Hospital Course:   Acute bronchitis versus upper airway cough syndrome with acute on chronic hypoxemic respiratory failure - Patient suspected to have COPD as an outpatient, previously underwent PFTs followed by [name] from pulmonology, pulmonary function tests without strong evidence for COPD. He presented to the hospital with cough/congestion and sneezing and URI - type symptoms. He was treated with  nebulizers, IV steroids, oral azithromycin and mucinex with significant improvement in his respiratory status and on day of discharge he was feeling back to normal, on his chronic O2, able to ambulate without difficulties. He will be discharged home in stable condition to finish Azithromycin and quick prednisone taper. He was recently taken off of albuterol per patient, will resume. Script given to patient. Influenza negative.  Acute on chronic diastolic congestive heart failure - patient restarted on his home Lasix, good UOP and stable renal function. Repeat 2D echo with normal EF without mention of DD, elevated PA pressure to 47, likely due to #1 and chronic hypoxia.  Diabetes mellitus type 2 - Hgb A1C 5.7 on 07/15/15, well controlled, continue Januvia at home Hyperlipidemia - continue home statin Essential hypertension - controlled, continue home medications Coronary artery disease - asymptomatic, no chest pain, continue home medications  Procedures:  2D echo Study Conclusions - Left ventricle: The cavity size was normal. Systolic function was normal. The estimated ejection fraction was in the range of 50% to 55%. Wall motion was normal; there were no regional wall motion abnormalities. - Aortic valve: Valve mobility was restricted. There was mild stenosis. Mean gradient (S): 7 mm Hg. Peak gradient (S): 15 mm Hg. - Aortic root: The aortic root was normal in size. - Mitral valve: Calcified annulus. There was mild regurgitation. - Left atrium: The atrium was moderately dilated. - Right ventricle: The cavity size was mildly dilated. Wall thickness was normal. - Right atrium: The atrium was mildly dilated. - Tricuspid valve: There was moderate regurgitation. - Pulmonary arteries:  Systolic pressure was moderately increased. PA peak pressure: 47 mm Hg (S). - Inferior vena cava: The vessel was normal in size. - Pericardium, extracardiac: There was no pericardial effusion.   Consultations:  None     Discharge Exam: Filed Vitals:   12/20/15 1610 12/20/15 0737 12/20/15 0947 12/20/15 1200  BP:  127/74  120/70  Pulse:  71  72  Temp:  98 F (36.7 C)  98.4 F (36.9 C)  TempSrc:  Oral  Oral  Resp:  24  21  Height:      Weight: 98.113 kg (216 lb 4.8 oz)     SpO2:  95% 96% 92%    General: NAD Cardiovascular: RRR Respiratory: CTA biL  Discharge Instructions Activity:  As tolerated   Get Medicines reviewed and adjusted: Please take all your medications with you for your next visit with your Primary MD  Please request your Primary MD to go over all hospital tests and procedure/radiological results at the follow up, please ask your Primary MD to get all Hospital records sent to his/her office.  If you experience worsening of your admission symptoms, develop shortness of breath, life threatening emergency, suicidal or homicidal thoughts you must seek medical attention immediately by calling 911 or calling your MD immediately if symptoms less severe.  You must read complete instructions/literature along with all the possible adverse reactions/side effects for all the Medicines you take and that have been prescribed to you. Take any new Medicines after you have completely understood and accpet all the possible adverse reactions/side effects.   Do not drive when taking Pain medications.   Do not take more than prescribed Pain, Sleep and Anxiety Medications  Special Instructions: If you have smoked or chewed Tobacco in the last 2 yrs please stop smoking, stop any regular Alcohol and or any Recreational drug use.  Wear Seat belts while driving.  Please note  You were cared for by a hospitalist during your hospital stay. Once you are discharged, your primary care physician will handle any further medical issues. Please note that NO REFILLS for any discharge medications will be authorized once you are discharged, as it is imperative that you return to your primary care physician (or  establish a relationship with a primary care physician if you do not have one) for your aftercare needs so that they can reassess your need for medications and monitor your lab values.    Medication List    TAKE these medications        albuterol 108 (90 Base) MCG/ACT inhaler  Commonly known as:  PROVENTIL HFA;VENTOLIN HFA  Inhale 2 puffs into the lungs every 4 (four) hours as needed for wheezing or shortness of breath.     amLODipine 10 MG tablet  Commonly known as:  NORVASC  Take 1 tablet (10 mg total) by mouth daily.     aspirin 81 MG tablet  Take 81 mg by mouth every morning.     azithromycin 250 MG tablet  Commonly known as:  ZITHROMAX  Take 1 tablet (250 mg total) by mouth daily.     calcium-vitamin D 500-200 MG-UNIT tablet  Commonly known as:  OSCAL WITH D  Take 1 tablet by mouth every morning.     carvedilol 25 MG tablet  Commonly known as:  COREG  Take 1.5 tablets (37.5 mg total) by mouth 2 (two) times daily with a meal.     dextromethorphan-guaiFENesin 30-600 MG 12hr tablet  Commonly known as:  MUCINEX DM  Take 1 tablet by mouth 2 (two) times daily as needed for cough.     furosemide 40 MG tablet  Commonly known as:  LASIX  Take 1 1/2 tablets by mouth twice daily and as needed     glucose blood test strip  Commonly known as:  ONE TOUCH ULTRA TEST  Check blood sugar no more than twice daily.     glyBURIDE 5 MG tablet  Commonly known as:  DIABETA  Take 2.5 mg by mouth daily with breakfast.     losartan 100 MG tablet  Commonly known as:  COZAAR  Take 1 tablet (100 mg total) by mouth daily.     lovastatin 20 MG tablet  Commonly known as:  MEVACOR  Take 1 tablet (20 mg total) by mouth at bedtime.     multivitamins ther. w/minerals Tabs tablet  Take 1 tablet by mouth daily.     niacin 1000 MG CR tablet  Commonly known as:  NIASPAN  Take 1 tablet (1,000 mg total) by mouth at bedtime.     nitroGLYCERIN 0.4 MG SL tablet  Commonly known as:  NITROSTAT    Place 1 tablet (0.4 mg total) under the tongue every 5 (five) minutes as needed for chest pain.     pantoprazole 40 MG tablet  Commonly known as:  PROTONIX  Take 1 tablet (40 mg total) by mouth every morning.     predniSONE 20 MG tablet  Commonly known as:  DELTASONE  Take 1 tablet (20 mg total) by mouth daily with breakfast.     sitaGLIPtin 100 MG tablet  Commonly known as:  JANUVIA  Take 1 tablet (100 mg total) by mouth daily.     tamsulosin 0.4 MG Caps capsule  Commonly known as:  FLOMAX  Take 1 capsule (0.4 mg total) by mouth daily.     TYLENOL PO  Take 1 tablet by mouth 2 (two) times daily as needed (pain).     zolpidem 10 MG tablet  Commonly known as:  AMBIEN  Take 1 tablet (10 mg total) by mouth at bedtime as needed. For sleep           Follow-up Information    Follow up with [name], MD. Schedule an appointment as soon as possible for a visit in 2 weeks.   Specialty:  Internal Medicine   Contact information:   [address] [place] [address] [phone]       The results of significant diagnostics from this hospitalization (including imaging, microbiology, ancillary and laboratory) are listed below for reference.    Significant Diagnostic Studies: Dg Chest Port 1 View  12/18/2015  CLINICAL DATA:  Short of breath EXAM: PORTABLE CHEST 1 VIEW COMPARISON:  08/22/2014 FINDINGS: Mild cardiac enlargement with vascular congestion and interstitial edema. Small bilateral effusions. Pacemaker with 2 leads in the right ventricle unchanged. Negative for pneumonia. IMPRESSION: Congestive heart failure with mild edema and small pleural effusions. Electronically Signed   By: Marlan Palau M.D.   On: 12/18/2015 22:01    Microbiology: Recent Results (from the past 240 hour(s))  MRSA PCR Screening     Status: None   Collection Time: 12/19/15  2:38 AM  Result Value Ref Range Status   MRSA by PCR NEGATIVE NEGATIVE Final    Comment:        The GeneXpert  MRSA Assay (FDA approved for NASAL specimens only), is one component of a comprehensive MRSA colonization surveillance program. It is  not intended to diagnose MRSA infection nor to guide or monitor treatment for MRSA infections.      Labs: Basic Metabolic Panel:  Recent Labs Lab 12/18/15 2200 12/20/15 0503  NA 136 139  K 4.1 4.9  CL 102 103  CO2 24 28  GLUCOSE 211* 204*  BUN 11 19  CREATININE 0.84 0.94  CALCIUM 8.4* 8.7*   Liver Function Tests:  Recent Labs Lab 12/18/15 2200  AST 26  ALT 15*  ALKPHOS 71  BILITOT 1.1  PROT 7.8  ALBUMIN 4.3   CBC:  Recent Labs Lab 12/18/15 2200  WBC 15.5*  HGB 15.2  HCT 43.4  MCV 89.3  PLT 187   Cardiac Enzymes:  Recent Labs Lab 12/18/15 2200 12/19/15 0445 12/19/15 0909 12/19/15 1554  TROPONINI <0.03 <0.03 <0.03 <0.03   BNP: BNP (last 3 results)  Recent Labs  12/18/15 2200  BNP 171.3*   CBG:  Recent Labs Lab 12/19/15 1641 12/19/15 2112 12/20/15 0738 12/20/15 1159  GLUCAP 170* 313* 193* 173*       Signed:  Amiel Mccaffrey  [facility] 12/20/2015, 3:20 PM

## 2015-12-20 NOTE — Progress Notes (Signed)
  Echocardiogram 2D Echocardiogram has been performed.  Bradley [name] 12/20/2015, 12:22 PM

## 2015-12-22 ENCOUNTER — Ambulatory Visit: Payer: Medicare Other | Admitting: Internal Medicine

## 2015-12-22 ENCOUNTER — Telehealth: Payer: Self-pay | Admitting: *Deleted

## 2015-12-22 LAB — RESPIRATORY VIRUS PANEL
Adenovirus: NEGATIVE
Influenza A: NEGATIVE
Influenza B: NEGATIVE
Metapneumovirus: NEGATIVE
Parainfluenza 1: NEGATIVE
Parainfluenza 2: NEGATIVE
Parainfluenza 3: NEGATIVE
Respiratory Syncytial Virus A: NEGATIVE
Respiratory Syncytial Virus B: NEGATIVE
Rhinovirus: NEGATIVE

## 2015-12-22 NOTE — Telephone Encounter (Signed)
Transition Care Management Follow-up Telephone Call  PCP: [name], MD  Admit date: 12/18/2015 Discharge date: 12/20/2015  Recommendations for Outpatient Follow-up:  1. Follow up with [name] in 2-4 weeks 2. Continue Zpack and steroids for 3 additional days  Discharge Diagnoses:  Principal Problem:  Acute on chronic respiratory failure with hypoxia (HCC) Active Problems:  DM II (diabetes mellitus, type II), controlled (HCC)  Hyperlipidemia  Essential hypertension  Chronic pulmonary hypertension secondary to elevated L H pressures   Sinoatrial node dysfunction (HCC)  Pacemaker-Medtronic  Acute on chronic combined systolic and diastolic congestive heart failure (HCC)  CAD (coronary artery disease) of artery bypass graft  Sepsis (HCC)  Discharge Condition: stable  Diet recommendation: heart healthy  How have you been since you were released from the hospital? "Excellent. I feel a hell of a lot better."   Do you understand why you were in the hospital? yes   Do you understand the discharge instructions? yes   Where were you discharged to? Home   Items Reviewed:  Medications reviewed: no  Allergies reviewed: yes  Dietary changes reviewed: yes  Referrals reviewed: no, none made   Functional Questionnaire:   Activities of Daily Living (ADLs):   He states they are independent in the following: ambulation, bathing and hygiene, feeding, continence, grooming, toileting and dressing States they require assistance with the following: None   Any transportation issues/concerns?: no   Any patient concerns? no   Confirmed importance and date/time of follow-up visits scheduled yes  Provider Appointment booked with Dr. [name] 12/24/15 @ 11:30am  Confirmed with patient if condition begins to worsen call PCP or go to the ER.  Patient was given the office number and encouraged to call back with question or concerns.  : yes, pt confirms that he has office  phone # and will call if any issues

## 2015-12-22 NOTE — Telephone Encounter (Signed)
thx

## 2015-12-24 ENCOUNTER — Ambulatory Visit (INDEPENDENT_AMBULATORY_CARE_PROVIDER_SITE_OTHER): Payer: Medicare Other | Admitting: Internal Medicine

## 2015-12-24 ENCOUNTER — Encounter: Payer: Self-pay | Admitting: Internal Medicine

## 2015-12-24 VITALS — BP 124/76 | HR 63 | Temp 98.2°F | Ht 68.0 in | Wt 219.4 lb

## 2015-12-24 DIAGNOSIS — I10 Essential (primary) hypertension: Secondary | ICD-10-CM | POA: Diagnosis not present

## 2015-12-24 DIAGNOSIS — J9621 Acute and chronic respiratory failure with hypoxia: Secondary | ICD-10-CM

## 2015-12-24 DIAGNOSIS — I5032 Chronic diastolic (congestive) heart failure: Secondary | ICD-10-CM

## 2015-12-24 DIAGNOSIS — E1159 Type 2 diabetes mellitus with other circulatory complications: Secondary | ICD-10-CM

## 2015-12-24 DIAGNOSIS — Z09 Encounter for follow-up examination after completed treatment for conditions other than malignant neoplasm: Secondary | ICD-10-CM

## 2015-12-24 LAB — CULTURE, BLOOD (ROUTINE X 2)
Culture: NO GROWTH
Culture: NO GROWTH

## 2015-12-24 NOTE — Patient Instructions (Signed)
GO TO THE FRONT DESK Schedule your next appointment for a  Routine check up When?   In 3 months  Fasting?  Yes

## 2015-12-24 NOTE — Progress Notes (Signed)
Pre visit review using our clinic review tool, if applicable. No additional management support is needed unless otherwise documented below in the visit note. 

## 2015-12-24 NOTE — Progress Notes (Signed)
Subjective:    Patient ID: Bradley Sit., male    DOB: 1941-04-18, 75 y.o.   MRN: [id number]  DOS:  12/24/2015 Type of visit - description : hospital follow-up, TCM Interval history:  Admitted 12/18/2015 for 2 days, dx acute bronchitis causing acute on chronic hypoxic respiratory failure Treated with nebs, steroids, antibiotics. He improved. CHF and other problems were stable  Chest x-ray show mild edema and small bilateral pleural effusions.  Review of Systems Since he left the hospital, took the medications as prescribed and is feeling much better. Denies fever or chills No chest pain, difficulty breathing at baseline, no edema. No nausea, diarrhea or blood in the stools Still has some cough, improved, some whitish sputum, no hemoptysis.  Past Medical History  Diagnosis Date  . HTN (hypertension)   . HLD (hyperlipidemia)   . BPH (benign prostatic hyperplasia)     Saw Dr [name] 2004, normal renal u/s  . Pleural effusion 2008    S/p decortication  . Insomnia   . Atrial fibrillation (HCC)     s/p AV node ablation & BiV PPM implantation 09/08/11 (op dictation pending)  . Pulmonary HTN (HCC)     per cath 2008  . Peripheral vascular disease (HCC)     ??  . Anemia     intermittent  . Pacemaker   . Heart murmur   . Type II diabetes mellitus (HCC) 1999  . On home oxygen therapy     at night as needed  . 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 CAD (coronary artery disease)   . Abnormal CT scan, chest 2012    CT chest, several lymphadenopathies. Sees pulmonary  . CHF (congestive heart failure) (HCC)     Thought primarily to be non-systolic although EF down (EF 40-98% 12/2010, down to 35-40% 09/05/11), cath 2008 with no CAD, nuclear study 07/2011 showing Small area of reversibility in the distal ant/lat wall the left ventricle suspicious for ischemia/septal wall HK but felt to be low risk  (per D/C Summary 07/2011)  . Chronic bronchitis (HCC)      "get it ~ q yr"  . Migraines     "very very rare"  . Arthritis     "?back" (08/22/2014)    Past Surgical History  Procedure Laterality Date  . Pleural scarification    . Lung decortication    . Colonoscopy  03/10/11    normal  . Pneumothorax with fibrothorax  ~ 2010  . Insert / replace / remove pacemaker  09/08/11    pacemaker placement  . Tonsillectomy      "as a kid" (09/27/2012)  . Cardiac catheterization      "couple times; never had balloon or stent" (09/27/2012)  . Bi-ventricular pacemaker insertion Left 09/08/2011    Procedure: BI-VENTRICULAR PACEMAKER INSERTION (CRT-P);  Surgeon: Marinus Maw, MD;  Location: [facility] CATH LAB;  Service: Cardiovascular;  Laterality: Left;    Social History   Social History  . Marital Status: Married    Spouse Name: [name]  . Number of Children: 1  . 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 Smoking status: Former Smoker -- 2.50 packs/day for 25 years    Types: Cigarettes    Quit date: 10/11/1980  . Smokeless tobacco: Never Used     Comment: used to smoke 2.5 ppd for 25 years  .  Alcohol Use: Yes     Comment: 08/22/2014 "maybe q couple months I'll have a beer w/dinner"  . Drug Use: No  . Sexual Activity: Not Currently   Other Topics Concern  . Not on file   Social History Narrative   Moved from [place] 2003.[place]   He lives w/ his wife                  Medication List       This list is accurate as of: 12/24/15 11:59 PM.  Always use your most recent med list.               albuterol 108 (90 Base) MCG/ACT inhaler  Commonly known as:  PROVENTIL HFA;VENTOLIN HFA  Inhale 2 puffs into the lungs every 4 (four) hours as needed for wheezing or shortness of breath.     amLODipine 10 MG tablet  Commonly known as:  NORVASC  Take 1 tablet (10 mg total) by mouth daily.     aspirin 81 MG tablet  Take 81 mg by mouth every morning.     calcium-vitamin D  500-200 MG-UNIT tablet  Commonly known as:  OSCAL WITH D  Take 1 tablet by mouth every morning.     carvedilol 25 MG tablet  Commonly known as:  COREG  Take 1.5 tablets (37.5 mg total) by mouth 2 (two) times daily with a meal.     dextromethorphan-guaiFENesin 30-600 MG 12hr tablet  Commonly known as:  MUCINEX DM  Take 1 tablet by mouth 2 (two) times daily as needed for cough.     furosemide 40 MG tablet  Commonly known as:  LASIX  Take 1 1/2 tablets by mouth twice daily and as needed     glucose blood test strip  Commonly known as:  ONE TOUCH ULTRA TEST  Check blood sugar no more than twice daily.     glyBURIDE 5 MG tablet  Commonly known as:  DIABETA  Take 2.5 mg by mouth daily with breakfast.     losartan 100 MG tablet  Commonly known as:  COZAAR  Take 1 tablet (100 mg total) by mouth daily.     lovastatin 20 MG tablet  Commonly known as:  MEVACOR  Take 1 tablet (20 mg total) by mouth at bedtime.     multivitamins ther. w/minerals Tabs tablet  Take 1 tablet by mouth daily.     niacin 1000 MG CR tablet  Commonly known as:  NIASPAN  Take 1 tablet (1,000 mg total) by mouth at bedtime.     nitroGLYCERIN 0.4 MG SL tablet  Commonly known as:  NITROSTAT  Place 1 tablet (0.4 mg total) under the tongue every 5 (five) minutes as needed for chest pain.     pantoprazole 40 MG tablet  Commonly known as:  PROTONIX  Take 1 tablet (40 mg total) by mouth every morning.     sitaGLIPtin 100 MG tablet  Commonly known as:  JANUVIA  Take 1 tablet (100 mg total) by mouth daily.     tamsulosin 0.4 MG Caps capsule  Commonly known as:  FLOMAX  Take 1 capsule (0.4 mg total) by mouth daily.     TYLENOL PO  Take 1 tablet by mouth 2 (two) times daily as needed (pain).     zolpidem 10 MG tablet  Commonly known as:  AMBIEN  Take 1 tablet (10 mg total) by mouth at bedtime as needed. For sleep  Objective:   Physical Exam BP 124/76 mmHg  Pulse 63  Temp(Src) 98.2 F (36.8  C) (Oral)  Ht 5\' 8"  (1.727 m)  Wt 219 lb 6 oz (99.508 kg)  BMI 33.36 kg/m2  SpO2 86% General:   Well developed, well nourished . NAD.  HEENT:  Normocephalic . Face symmetric, atraumatic. Neck: No JVD at 45 Lungs:  Mildly decreased breath sounds but otherwise clear Normal respiratory effort, no intercostal retractions, no accessory muscle use. Heart: Seems regular,  no murmur.  No pretibial edema bilaterally  Skin: Not pale. Not jaundice Neurologic:  alert & oriented X3.  Speech normal, gait appropriate for age and unassisted Psych--  Cognition and judgment appear intact.  Cooperative with normal attention span and concentration.  Behavior appropriate. No anxious or depressed appearing.      Assessment & Plan:   Assessment > DM HTN Hyperlipidemia Anxiety- insomnia  BPH Cardiovascular:Dr Ladona Ridgel  --CAD --CHF --Atrial fibrillation --Pacemaker --h/o IC bleeding: Not anticoagulated --Peripheral vascular disease? Pulmonary: Dr [name] --COPD, severe, chronic resp failure, night O2 prn, has portable O2 --Abnormal CT chest, multiple lymphadenopathies. --Pleural effusion s/p decortication --Pulmonary hypertension  Stasis dermatitis   PLAN: Bronchitis, acute on chronic respiratory failure: s/p admission. Doing better after antibiotics and steroids. Continue with routine medications. COPD, respiratory failure chronic: O2 sat today is low, he is in no distress and feeling well, recommend to continue with nighttime oxygen and use his portable oxygen during the daytime. DM: Continue glyburide and Januvia, no symptoms of low blood sugar, CBG this morning 96 CHF: stable HTN: Well-controlled, last BMP satisfactory  RTC 3 months

## 2015-12-25 ENCOUNTER — Other Ambulatory Visit: Payer: Self-pay | Admitting: Internal Medicine

## 2015-12-25 ENCOUNTER — Other Ambulatory Visit: Payer: Self-pay

## 2015-12-25 DIAGNOSIS — I5022 Chronic systolic (congestive) heart failure: Secondary | ICD-10-CM

## 2015-12-25 MED ORDER — FUROSEMIDE 40 MG PO TABS: ORAL_TABLET | ORAL | Status: DC

## 2015-12-25 NOTE — Assessment & Plan Note (Addendum)
Bronchitis, acute on chronic respiratory failure: s/p admission. Doing better after antibiotics and steroids. Continue with routine medications. COPD, respiratory failure chronic: O2 sat today is low, he is in no distress and feeling well, recommend to continue with nighttime oxygen and use his portable oxygen during the daytime. DM: Continue glyburide and Januvia, no symptoms of low blood sugar, CBG this morning 96 CHF: stable HTN: Well-controlled, last BMP satisfactory  RTC 3 months

## 2015-12-29 ENCOUNTER — Other Ambulatory Visit: Payer: Self-pay | Admitting: Internal Medicine

## 2016-01-14 ENCOUNTER — Other Ambulatory Visit: Payer: Self-pay

## 2016-01-14 MED ORDER — GLYBURIDE 5 MG PO TABS: 2.5000 mg | ORAL_TABLET | Freq: Every day | ORAL | Status: DC

## 2016-02-02 ENCOUNTER — Other Ambulatory Visit: Payer: Self-pay | Admitting: Internal Medicine

## 2016-02-02 NOTE — Telephone Encounter (Signed)
Refill sent per [facility] refill protocol/SLS  

## 2016-02-05 ENCOUNTER — Encounter (HOSPITAL_BASED_OUTPATIENT_CLINIC_OR_DEPARTMENT_OTHER): Payer: Self-pay | Admitting: *Deleted

## 2016-02-05 ENCOUNTER — Emergency Department (HOSPITAL_BASED_OUTPATIENT_CLINIC_OR_DEPARTMENT_OTHER): Payer: Medicare Other

## 2016-02-05 ENCOUNTER — Emergency Department (HOSPITAL_BASED_OUTPATIENT_CLINIC_OR_DEPARTMENT_OTHER)
Admission: EM | Admit: 2016-02-05 | Discharge: 2016-02-06 | Disposition: A | Discharge status: Home / Self Care | Payer: Medicare Other | Attending: Emergency Medicine | Admitting: Emergency Medicine

## 2016-02-05 DIAGNOSIS — M2392 Unspecified internal derangement of left knee: Secondary | ICD-10-CM | POA: Diagnosis not present

## 2016-02-05 DIAGNOSIS — Y9301 Activity, walking, marching and hiking: Secondary | ICD-10-CM | POA: Insufficient documentation

## 2016-02-05 DIAGNOSIS — I509 Heart failure, unspecified: Secondary | ICD-10-CM | POA: Insufficient documentation

## 2016-02-05 DIAGNOSIS — Y929 Unspecified place or not applicable: Secondary | ICD-10-CM | POA: Insufficient documentation

## 2016-02-05 DIAGNOSIS — I110 Hypertensive heart disease with heart failure: Secondary | ICD-10-CM | POA: Diagnosis not present

## 2016-02-05 DIAGNOSIS — S6991XA Unspecified injury of right wrist, hand and finger(s), initial encounter: Secondary | ICD-10-CM | POA: Diagnosis present

## 2016-02-05 DIAGNOSIS — Z87891 Personal history of nicotine dependence: Secondary | ICD-10-CM | POA: Insufficient documentation

## 2016-02-05 DIAGNOSIS — I4891 Unspecified atrial fibrillation: Secondary | ICD-10-CM | POA: Insufficient documentation

## 2016-02-05 DIAGNOSIS — W1839XA Other fall on same level, initial encounter: Secondary | ICD-10-CM | POA: Diagnosis not present

## 2016-02-05 DIAGNOSIS — E1151 Type 2 diabetes mellitus with diabetic peripheral angiopathy without gangrene: Secondary | ICD-10-CM | POA: Diagnosis not present

## 2016-02-05 DIAGNOSIS — E785 Hyperlipidemia, unspecified: Secondary | ICD-10-CM | POA: Insufficient documentation

## 2016-02-05 DIAGNOSIS — S63501A Unspecified sprain of right wrist, initial encounter: Secondary | ICD-10-CM

## 2016-02-05 DIAGNOSIS — I2510 Atherosclerotic heart disease of native coronary artery without angina pectoris: Secondary | ICD-10-CM | POA: Diagnosis not present

## 2016-02-05 DIAGNOSIS — Y999 Unspecified external cause status: Secondary | ICD-10-CM | POA: Insufficient documentation

## 2016-02-05 DIAGNOSIS — W19XXXA Unspecified fall, initial encounter: Secondary | ICD-10-CM

## 2016-02-05 MED ORDER — ACETAMINOPHEN 500 MG PO TABS
1000.0000 mg | ORAL_TABLET | Freq: Once | ORAL | Status: AC
  Administered 2016-02-05: 1000 mg via ORAL
  Filled 2016-02-05: qty 2

## 2016-02-05 NOTE — ED Notes (Signed)
Pt c/o fall x 12 hrs ago injuring right wrist and left knee

## 2016-02-05 NOTE — ED Provider Notes (Signed)
CSN: [id number]     Arrival date & time 02/05/16  2136 History  By signing my name below, I, Bradley [name], attest that this documentation has been prepared under the direction and in the presence of Bradley Libra, MD. Electronically Signed: Bethel [name], ED Scribe. 02/05/2016. 11:19 PM    Chief Complaint  Patient presents with  . Fall   The history is provided by the patient. No language interpreter was used.   Bradley Searles Sr. is a 75 y.o. male who presents to the Emergency Department for evaluation after a fall 12 hours ago. Pt states that he was checking his mail while walking his dog. The dog pulled him forward and he fell to his left knee and right wrist.  Associated symptoms include left knee pain and swelling, and right wrist pain. The knee pain is worse with movement.  Pt denies neck pain. No head injury or LOC.   Past Medical History  Diagnosis Date  . HTN (hypertension)   . HLD (hyperlipidemia)   . BPH (benign prostatic hyperplasia)     Saw Dr [name] 2004, normal renal u/s  . Pleural effusion 2008    S/p decortication  . Insomnia   . Atrial fibrillation (HCC)     s/p AV node ablation & BiV PPM implantation 09/08/11 (op dictation pending)  . Pulmonary HTN (HCC)     per cath 2008  . Peripheral vascular disease (HCC)     ??  . Anemia     intermittent  . Pacemaker   . Heart murmur   . Type II diabetes mellitus (HCC) 1999  . On home oxygen therapy     at night as needed  . 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 CAD (coronary artery disease)   . Abnormal CT scan, chest 2012    CT chest, several lymphadenopathies. Sees pulmonary  . CHF (congestive heart failure) (HCC)     Thought primarily to be non-systolic although EF down (EF 40-98% 12/2010, down to 35-40% 09/05/11), cath 2008 with no CAD, nuclear study 07/2011 showing Small area of reversibility in the distal ant/lat wall the left ventricle suspicious for ischemia/septal wall HK but  felt to be low risk  (per D/C Summary 07/2011)  . Chronic bronchitis (HCC)     "get it ~ q yr"  . Migraines     "very very rare"  . Arthritis     "?back" (08/22/2014)   Past Surgical History  Procedure Laterality Date  . Pleural scarification    . Lung decortication    . Colonoscopy  03/10/11    normal  . Pneumothorax with fibrothorax  ~ 2010  . Insert / replace / remove pacemaker  09/08/11    pacemaker placement  . Tonsillectomy      "as a kid" (09/27/2012)  . Cardiac catheterization      "couple times; never had balloon or stent" (09/27/2012)  . Bi-ventricular pacemaker insertion Left 09/08/2011    Procedure: BI-VENTRICULAR PACEMAKER INSERTION (CRT-P);  Surgeon: Marinus Maw, MD;  Location[facility] CATH LAB;  Service: Cardiovascular;  Laterality: Left;   Family History  Problem Relation Age of Onset  . Diabetes Father   . Breast cancer Maternal Aunt   . Prostate cancer Neg Hx   . Colon cancer Neg Hx   . Coronary artery disease Father   . Polycythemia Mother    Social History  Substance Use Topics  . Smoking status: Former Smoker --  2.50 packs/day for 25 years    Types: Cigarettes    Quit date: 10/11/1980  . Smokeless tobacco: Never Used     Comment: used to smoke 2.5 ppd for 25 years  . Alcohol Use: Yes     Comment: 08/22/2014 "maybe q couple months I'll have a beer w/dinner"    Review of Systems 10 Systems reviewed and all are negative for acute change except as noted in the HPI.   Allergies  Hydrocodone; Tramadol; Tadalafil; and Avelox  Home Medications   Prior to Admission medications   Medication Sig Start Date End Date Taking? Authorizing Provider  Acetaminophen (TYLENOL PO) Take 1 tablet by mouth 2 (two) times daily as needed (pain).    Historical Provider, MD  albuterol (PROVENTIL HFA;VENTOLIN HFA) 108 (90 Base) MCG/ACT inhaler Inhale 2 puffs into the lungs every 4 (four) hours as needed for wheezing or shortness of breath. 12/20/15   Costin Otelia Sergeant, MD   amLODipine (NORVASC) 10 MG tablet Take 1 tablet (10 mg total) by mouth daily. 07/24/15   [name], MD  aspirin 81 MG tablet Take 81 mg by mouth every morning.     Historical Provider, MD  calcium-vitamin D (OSCAL WITH D) 500-200 MG-UNIT tablet Take 1 tablet by mouth every morning.    Historical Provider, MD  carvedilol (COREG) 25 MG tablet Take 1.5 tablets (37.5 mg total) by mouth 2 (two) times daily with a meal. 07/22/15   [name], MD  dextromethorphan-guaiFENesin [facility] DM) 30-600 MG 12hr tablet Take 1 tablet by mouth 2 (two) times daily as needed for cough. 12/20/15   Costin Otelia Sergeant, MD  furosemide (LASIX) 40 MG tablet Take 1 1/2 tablets by mouth twice daily and as needed 12/25/15   Marinus Maw, MD  glucose blood (ONE TOUCH ULTRA TEST) test strip Check blood sugar no more than twice daily. 06/06/15   [name], MD  glyBURIDE (DIABETA) 5 MG tablet Take 0.5 tablets (2.5 mg total) by mouth daily with breakfast. 01/14/16   [name], MD  losartan (COZAAR) 100 MG tablet Take 1 tablet (100 mg total) by mouth daily. 09/11/15   [name], MD  lovastatin (MEVACOR) 20 MG tablet Take 1 tablet (20 mg total) by mouth at bedtime. 09/25/15   [name], MD  Multiple Vitamins-Minerals (MULTIVITAMINS THER. W/MINERALS) TABS tablet Take 1 tablet by mouth daily. 09/14/13   [name], MD  niacin (NIASPAN) 1000 MG CR tablet Take 1 tablet (1,000 mg total) by mouth at bedtime. 09/25/15   [name], MD  pantoprazole (PROTONIX) 40 MG tablet Take 1 tablet (40 mg total) by mouth every morning. 12/29/15   [name], MD  sitaGLIPtin (JANUVIA) 100 MG tablet Take 1 tablet (100 mg total) by mouth daily. 09/22/15   [name], MD  tamsulosin (FLOMAX) 0.4 MG CAPS capsule TAKE 1 CAPSULE DAILY 02/02/16   [name], MD  zolpidem (AMBIEN) 10 MG tablet Take 1 tablet (10 mg total) by mouth at bedtime as needed. For sleep 04/21/15   [name], MD   BP 125/68 mmHg  Pulse 74  Temp(Src) 98.2 F (36.8 C) (Oral)  Resp 16  Wt  219 lb (99.338 kg)  SpO2 90% Physical Exam General: Well-developed, well-nourished male in no acute distress; appearance consistent with age of record HENT: normocephalic; atraumatic Eyes: pupils equal, round and reactive to light; extraocular muscles intact, bilateral arcus senilis Neck: supple, cervical spine  no ntender  Heart: regular rate and rhythm; no murmurs, rubs or gallops Lungs: clear to auscultation bilaterally Abdomen: soft; nondistended; nontender; no masses or hepatosplenomegaly; bowel sounds present Extremities: No deformity; full range of motion except left knee limited by pain and swelling and right wrist limited by pain; pulses normal; left knee with effusion and pain on flexion, abrasion to anterior left knee, left leg distally NVI; pain at right wrist over ulnar collateral ligament without swelling, ecchymosis or ligamental laxity Neurologic: Awake, alert and oriented; motor function intact in all extremities and symmetric; no facial droop Skin: Warm and dry; chronic appearing changes to the lower legs  Psychiatric: Normal mood and affect  ED Course  Procedures (including critical care time) COORDINATION OF CARE: 11:17 PM Discussed treatment plan which includes XRs of the right wrist and left knee with pt at bedside and pt agreed to plan.   MDM  Nursing notes and vitals signs, including pulse oximetry, reviewed.  Summary of this visit's results, reviewed by myself:  Imaging Studies: Dg Wrist Complete Right  02/05/2016  CLINICAL DATA:  Fall tonight with ulnar-sided wrist pain. Initial encounter. EXAM: RIGHT WRIST - COMPLETE 3+ VIEW COMPARISON:  None. FINDINGS: There is no evidence of fracture or dislocation. No acute soft tissue finding. Osteopenia. IMPRESSION: Negative for fracture. Electronically Signed   By: Marnee Spring M.D.   On: 02/05/2016 22:46   Dg Knee Complete 4 Views Left  02/05/2016  CLINICAL DATA:  Status post fall, with left knee twisting injury.  Left knee pain and swelling. Initial encounter. EXAM: LEFT KNEE - COMPLETE 4+ VIEW COMPARISON:  None. FINDINGS: There is question of mild cortical irregularity along the articular surface of the patella, which could reflect a small fracture. The joint spaces are preserved. Marginal osteophyte formation is noted at the medial compartment. A moderate knee joint effusion is noted. The visualized soft tissues are normal in appearance. IMPRESSION: Question of mild cortical irregularity along the articular surface of the patella, which could reflect a small fracture. Moderate knee joint effusion noted. MRI would be helpful for further evaluation, as deemed clinically appropriate, to assess for underlying trabecular bone injury and internal derangement. Electronically Signed   By: Roanna Raider M.D.   On: 02/05/2016 22:46     Final diagnoses:  Knee internal derangement, left  Sprain of right wrist, initial encounter  Fall, initial encounter   I personally performed the services described in this documentation, which was scribed in my presence. The recorded information has been reviewed and is accurate.   Bradley Libra, MD 02/05/16 2329

## 2016-02-05 NOTE — ED Notes (Signed)
MD at bedside. 

## 2016-02-09 ENCOUNTER — Encounter: Payer: Self-pay | Admitting: Family Medicine

## 2016-02-09 ENCOUNTER — Ambulatory Visit (INDEPENDENT_AMBULATORY_CARE_PROVIDER_SITE_OTHER): Payer: Medicare Other | Admitting: Family Medicine

## 2016-02-09 DIAGNOSIS — S63501A Unspecified sprain of right wrist, initial encounter: Secondary | ICD-10-CM | POA: Diagnosis not present

## 2016-02-09 DIAGNOSIS — S8992XA Unspecified injury of left lower leg, initial encounter: Secondary | ICD-10-CM

## 2016-02-09 DIAGNOSIS — S82002A Unspecified fracture of left patella, initial encounter for closed fracture: Secondary | ICD-10-CM

## 2016-02-09 DIAGNOSIS — M25562 Pain in left knee: Secondary | ICD-10-CM

## 2016-02-09 NOTE — Patient Instructions (Signed)
I'm concerned you may have fractured your kneecap. We will go ahead with an MRI since your x-ray does not show this definitively. Wear the immobilizer and keep knee straight at all times. Ok to take this off to ice the area 3 times a day 15 minutes at a time at least. Ok to take it off to wash it too. Advil (ibuprofen) 600mg  three times a day with food for pain and inflammation. Can take tylenol 500mg  1-2 tabs three times a day in addition to this. Follow up will be determined by the MRI results.  You sprained your wrist - take medicines as noted above. Only use the wrist brace if you need it.

## 2016-02-10 ENCOUNTER — Ambulatory Visit (INDEPENDENT_AMBULATORY_CARE_PROVIDER_SITE_OTHER): Payer: Medicare Other | Admitting: *Deleted

## 2016-02-10 ENCOUNTER — Telehealth: Payer: Self-pay | Admitting: Cardiology

## 2016-02-10 DIAGNOSIS — I495 Sick sinus syndrome: Secondary | ICD-10-CM

## 2016-02-10 NOTE — Assessment & Plan Note (Signed)
independently reviewed radiographs.  Concerning for posterior patellar fracture on lateral radiographs though this could represent shadowing/artifact.  We will go ahead with an MRI to further assess - he does have a large effusion.  Continue using immobilizer.  Can ice this, nsaids with tylenol as needed.

## 2016-02-10 NOTE — Progress Notes (Addendum)
PCP: [name], MD  Subjective:   HPI: Patient is a 75 y.o. male here for left knee, right wrist injuries.  Patient reports he had his dog on a leash on 4/27 when the dog lunged causing him to fall directly onto left knee and right wrist (FOOSH type) on concrete driveway. Immediate pain, difficulty bearing weight. Pain level 8/10 in knee (sharp), 2/10 in wrist (dull). Wearing knee immobilizer, wrist brace. Is right handed. No prior knee, wrist injuries. + swelling, bruising of knee.  Mild bruising volar area of wrist. No numbness, other complaints.  Past Medical History  Diagnosis Date  . HTN (hypertension)   . HLD (hyperlipidemia)   . BPH (benign prostatic hyperplasia)     Saw Dr [name] 2004, normal renal u/s  . Pleural effusion 2008    S/p decortication  . Insomnia   . Atrial fibrillation (HCC)     s/p AV node ablation & BiV PPM implantation 09/08/11 (op dictation pending)  . Pulmonary HTN (HCC)     per cath 2008  . Peripheral vascular disease (HCC)     ??  . Anemia     intermittent  . Pacemaker   . Heart murmur   . Type II diabetes mellitus (HCC) 1999  . On home oxygen therapy     at night as needed  . 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 CAD (coronary artery disease)   . Abnormal CT scan, chest 2012    CT chest, several lymphadenopathies. Sees pulmonary  . CHF (congestive heart failure) (HCC)     Thought primarily to be non-systolic although EF down (EF 16-10% 12/2010, down to 35-40% 09/05/11), cath 2008 with no CAD, nuclear study 07/2011 showing Small area of reversibility in the distal ant/lat wall the left ventricle suspicious for ischemia/septal wall HK but felt to be low risk  (per D/C Summary 07/2011)  . Chronic bronchitis (HCC)     "get it ~ q yr"  . Migraines     "very very rare"  . Arthritis     "?back" (08/22/2014)    Current Outpatient Prescriptions on File Prior to Visit  Medication Sig Dispense Refill  .  Acetaminophen (TYLENOL PO) Take 1 tablet by mouth 2 (two) times daily as needed (pain).    Marland Kitchen albuterol (PROVENTIL HFA;VENTOLIN HFA) 108 (90 Base) MCG/ACT inhaler Inhale 2 puffs into the lungs every 4 (four) hours as needed for wheezing or shortness of breath. 1 Inhaler 2  . amLODipine (NORVASC) 10 MG tablet Take 1 tablet (10 mg total) by mouth daily. 90 tablet 2  . aspirin 81 MG tablet Take 81 mg by mouth every morning.     . calcium-vitamin D (OSCAL WITH D) 500-200 MG-UNIT tablet Take 1 tablet by mouth every morning.    . carvedilol (COREG) 25 MG tablet Take 1.5 tablets (37.5 mg total) by mouth 2 (two) times daily with a meal. 270 tablet 2  . dextromethorphan-guaiFENesin (MUCINEX DM) 30-600 MG 12hr tablet Take 1 tablet by mouth 2 (two) times daily as needed for cough. 10 tablet 0  . furosemide (LASIX) 40 MG tablet Take 1 1/2 tablets by mouth twice daily and as needed 270 tablet 1  . glucose blood (ONE TOUCH ULTRA TEST) test strip Check blood sugar no more than twice daily. 100 each 12  . glyBURIDE (DIABETA) 5 MG tablet Take 0.5 tablets (2.5 mg total) by mouth daily with breakfast. 45 tablet 1  . losartan (COZAAR)  100 MG tablet Take 1 tablet (100 mg total) by mouth daily. 90 tablet 2  . lovastatin (MEVACOR) 20 MG tablet Take 1 tablet (20 mg total) by mouth at bedtime. 90 tablet 2  . Multiple Vitamins-Minerals (MULTIVITAMINS THER. W/MINERALS) TABS tablet Take 1 tablet by mouth daily. 30 each 5  . niacin (NIASPAN) 1000 MG CR tablet Take 1 tablet (1,000 mg total) by mouth at bedtime. 90 tablet 2  . pantoprazole (PROTONIX) 40 MG tablet Take 1 tablet (40 mg total) by mouth every morning. 90 tablet 2  . sitaGLIPtin (JANUVIA) 100 MG tablet Take 1 tablet (100 mg total) by mouth daily. 90 tablet 2  . tamsulosin (FLOMAX) 0.4 MG CAPS capsule TAKE 1 CAPSULE DAILY 90 capsule 1  . zolpidem (AMBIEN) 10 MG tablet Take 1 tablet (10 mg total) by mouth at bedtime as needed. For sleep 90 tablet 1   No current  facility-administered medications on file prior to visit.    Past Surgical History  Procedure Laterality Date  . Pleural scarification    . Lung decortication    . Colonoscopy  03/10/11    normal  . Pneumothorax with fibrothorax  ~ 2010  . Insert / replace / remove pacemaker  09/08/11    pacemaker placement  . Tonsillectomy      "as a kid" (09/27/2012)  . Cardiac catheterization      "couple times; never had balloon or stent" (09/27/2012)  . Bi-ventricular pacemaker insertion Left 09/08/2011    Procedure: BI-VENTRICULAR PACEMAKER INSERTION (CRT-P);  Surgeon: Marinus Maw, MD;  Location: [facility] CATH LAB;  Service: Cardiovascular;  Laterality: Left;    Allergies  Allergen Reactions  . Hydrocodone Other (See Comments)    "given to him in the hospital; went thru withdrawals once home; dr said not to take it again" (09/27/2012)  . Tramadol Other (See Comments)    "given to him in the hospital; went thru withdrawals once home; dr said not to take it again" (09/27/2012)  . Tadalafil Other (See Comments)    headache, backache  . Avelox [Moxifloxacin Hydrochloride] Itching and Other (See Comments)    Headache    Social History   Social History  . Marital Status: Married    Spouse Name: [name]  . Number of Children: 1  . 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 Smoking status: Former Smoker -- 2.50 packs/day for 25 years    Types: Cigarettes    Quit date: 10/11/1980  . Smokeless tobacco: Never Used     Comment: used to smoke 2.5 ppd for 25 years  . Alcohol Use: 0.0 oz/week    0 Standard drinks or equivalent per week     Comment: 08/22/2014 "maybe q couple months I'll have a beer w/dinner"  . Drug Use: No  . Sexual Activity: Not Currently   Other Topics Concern  . Not on file   Social History Narrative   Moved from [place] 2003.Marland Kitchen   He lives w/ his wife              Family History  Problem Relation  Age of Onset  . Diabetes Father   . Breast cancer Maternal Aunt   . Prostate cancer Neg Hx   . Colon cancer Neg Hx   . Coronary artery disease Father   . Polycythemia Mother     BP 119/72 mmHg  Pulse 75  Ht 5'  9" (1.753 m)  Wt 220 lb (99.791 kg)  BMI 32.47 kg/m2  Review of Systems: See HPI above.    Objective:  Physical Exam:  Gen: NAD, comfortable in exam room  Left knee: Large effusion.  Healing abrasions, localized bruising.  No other deformity. TTP patella primarily.  Less medial, lateral joint lines. Able to actively extend knee - ROM 0 - 30 degrees (did not test flexion with possible patellar fracture). Negative valgus/varus testing.  NV intact distally.  Right wrist: Mild bruising, swelling volar wrist.  No other deformity. No focal tenderness. FROM wrist, digits without pain currently. NVI distally.    Assessment & Plan:  1. Left knee injury - independently reviewed radiographs.  Concerning for posterior patellar fracture on lateral radiographs though this could represent shadowing/artifact.  We will go ahead with an MRI to further assess - he does have a large effusion.  Continue using immobilizer.  Can ice this, nsaids with tylenol as needed.    2. Right wrist sprain - reassured.  This is improving very well and exam currently benign besides some localized bruising and swelling.  Can discontinue brace.   Addendum:  Patient has a pacemaker - will get CT scan of knee instead.  Addendum:  CT scan reviewed, discussed with patient and his wife.  No patellar fracture but he has a PCL avulsion fracture displaced 68mm.  We discussed options - generally these require surgery though concern with his heart if he would be a surgical candidate.  Some literature suggests considering surgery initially only when beyond 68mm displacement. Advised we get him in urgently with orthopedic surgery to discuss possible ORIF.

## 2016-02-10 NOTE — Addendum Note (Signed)
Addended by: Kathi Simpers F on: 02/10/2016 12:19 PM   Modules accepted: Orders

## 2016-02-10 NOTE — Addendum Note (Signed)
Addended by: Lenda Kelp on: 02/10/2016 11:17 AM   Modules accepted: SmartSet

## 2016-02-10 NOTE — Telephone Encounter (Signed)
Spoke with pt and reminded pt of remote transmission that is due today. Pt verbalized understanding.   

## 2016-02-10 NOTE — Assessment & Plan Note (Signed)
reassured.  This is improving very well and exam currently benign besides some localized bruising and swelling.  Can discontinue brace.

## 2016-02-10 NOTE — Addendum Note (Signed)
Addended by: Kathi Simpers F on: 02/10/2016 11:01 AM   Modules accepted: Orders

## 2016-02-10 NOTE — Progress Notes (Signed)
Remote pacemaker transmission.   

## 2016-02-11 ENCOUNTER — Ambulatory Visit (HOSPITAL_BASED_OUTPATIENT_CLINIC_OR_DEPARTMENT_OTHER)
Admission: RE | Admit: 2016-02-11 | Discharge: 2016-02-11 | Disposition: A | Discharge status: Home / Self Care | Payer: Medicare Other | Source: Ambulatory Visit | Attending: Family Medicine | Admitting: Family Medicine

## 2016-02-11 DIAGNOSIS — S82002A Unspecified fracture of left patella, initial encounter for closed fracture: Secondary | ICD-10-CM

## 2016-02-11 DIAGNOSIS — X58XXXA Exposure to other specified factors, initial encounter: Secondary | ICD-10-CM | POA: Insufficient documentation

## 2016-02-11 DIAGNOSIS — S82102A Unspecified fracture of upper end of left tibia, initial encounter for closed fracture: Secondary | ICD-10-CM | POA: Diagnosis not present

## 2016-02-11 DIAGNOSIS — M8580 Other specified disorders of bone density and structure, unspecified site: Secondary | ICD-10-CM | POA: Diagnosis not present

## 2016-02-12 ENCOUNTER — Telehealth: Payer: Self-pay | Admitting: Family Medicine

## 2016-02-12 NOTE — Addendum Note (Signed)
Addended by: Kathi Simpers F on: 02/12/2016 08:02 AM   Modules accepted: Orders

## 2016-02-12 NOTE — Telephone Encounter (Signed)
Spoke to patient and he advised me that he found the number.

## 2016-02-13 NOTE — Addendum Note (Signed)
Addended by: Norton Blizzard R on: 02/13/2016 11:11 AM   Modules accepted: SmartSet

## 2016-02-17 ENCOUNTER — Telehealth: Payer: Self-pay | Admitting: Internal Medicine

## 2016-02-17 MED ORDER — ZOLPIDEM TARTRATE 10 MG PO TABS: 10.0000 mg | ORAL_TABLET | Freq: Every evening | ORAL | Status: DC | PRN

## 2016-02-17 NOTE — Telephone Encounter (Signed)
Rx faxed to [facility].

## 2016-02-17 NOTE — Telephone Encounter (Signed)
Ok 90 and one refill 

## 2016-02-17 NOTE — Telephone Encounter (Signed)
Rx printed, awaiting MD signature.  

## 2016-02-17 NOTE — Telephone Encounter (Signed)
Pt's spouse [name] called in a refill request for zolpidem 90 tablets.    CB: [phone]   Pharmacy: [facility] - [place], [place] - [address]

## 2016-02-17 NOTE — Telephone Encounter (Signed)
Pt is requesting refill on Ambien.  Last OV: 12/24/2015 Last Fill: 04/21/2015 #90 and 1RF UDS: Not needed for Ambien  Okay to refill?

## 2016-03-03 LAB — HM DIABETES EYE EXAM

## 2016-03-12 ENCOUNTER — Encounter: Payer: Self-pay | Admitting: Internal Medicine

## 2016-03-19 ENCOUNTER — Encounter: Payer: Self-pay | Admitting: Cardiology

## 2016-03-22 LAB — CUP PACEART REMOTE DEVICE CHECK
Battery Remaining Longevity: 71 mo
Battery Voltage: 3 V
Brady Statistic AP VP Percent: 0 %
Brady Statistic AP VS Percent: 0 %
Brady Statistic AS VP Percent: 98.37 %
Brady Statistic AS VS Percent: 1.63 %
Brady Statistic RA Percent Paced: 0 %
Brady Statistic RV Percent Paced: 98.37 %
Date Time Interrogation Session: 20170502223147
Implantable Lead Implant Date: 20121128
Implantable Lead Implant Date: 20121128
Implantable Lead Location: 753858
Implantable Lead Location: 753860
Implantable Lead Model: 419488
Implantable Lead Model: 5076
Lead Channel Impedance Value: 323 Ohm
Lead Channel Impedance Value: 4047 Ohm
Lead Channel Impedance Value: 4047 Ohm
Lead Channel Impedance Value: 418 Ohm
Lead Channel Impedance Value: 418 Ohm
Lead Channel Impedance Value: 494 Ohm
Lead Channel Impedance Value: 551 Ohm
Lead Channel Impedance Value: 570 Ohm
Lead Channel Impedance Value: 608 Ohm
Lead Channel Pacing Threshold Amplitude: 0.75 V
Lead Channel Pacing Threshold Amplitude: 0.875 V
Lead Channel Pacing Threshold Pulse Width: 0.4 ms
Lead Channel Pacing Threshold Pulse Width: 0.4 ms
Lead Channel Sensing Intrinsic Amplitude: 5.125 mV
Lead Channel Sensing Intrinsic Amplitude: 5.125 mV
Lead Channel Setting Pacing Amplitude: 2 V
Lead Channel Setting Pacing Amplitude: 2 V
Lead Channel Setting Pacing Pulse Width: 0.4 ms
Lead Channel Setting Pacing Pulse Width: 0.4 ms
Lead Channel Setting Sensing Sensitivity: 4 mV

## 2016-03-26 ENCOUNTER — Encounter: Payer: Self-pay | Admitting: Internal Medicine

## 2016-03-26 ENCOUNTER — Ambulatory Visit (INDEPENDENT_AMBULATORY_CARE_PROVIDER_SITE_OTHER): Payer: Medicare Other | Admitting: Internal Medicine

## 2016-03-26 VITALS — BP 126/74 | HR 72 | Temp 97.6°F | Ht 69.0 in | Wt 218.5 lb

## 2016-03-26 DIAGNOSIS — E785 Hyperlipidemia, unspecified: Secondary | ICD-10-CM

## 2016-03-26 DIAGNOSIS — E118 Type 2 diabetes mellitus with unspecified complications: Secondary | ICD-10-CM | POA: Diagnosis not present

## 2016-03-26 DIAGNOSIS — I509 Heart failure, unspecified: Secondary | ICD-10-CM

## 2016-03-26 LAB — LIPID PANEL
Cholesterol: 150 mg/dL (ref 0–200)
HDL: 48 mg/dL (ref >39.00)
LDL Cholesterol: 89 mg/dL (ref 0–99)
NonHDL: 102.11
Total CHOL/HDL Ratio: 3
Triglycerides: 66 mg/dL (ref 0.0–149.0)
VLDL: 13.2 mg/dL (ref 0.0–40.0)

## 2016-03-26 LAB — BASIC METABOLIC PANEL
BUN: 15 mg/dL (ref 6–23)
CO2: 30 mEq/L (ref 19–32)
Calcium: 8.9 mg/dL (ref 8.4–10.5)
Chloride: 103 mEq/L (ref 96–112)
Creatinine, Ser: 0.82 mg/dL (ref 0.40–1.50)
GFR: 97.32 mL/min (ref >60.00)
Glucose, Bld: 83 mg/dL (ref 70–99)
Potassium: 4.4 mEq/L (ref 3.5–5.1)
Sodium: 141 mEq/L (ref 135–145)

## 2016-03-26 LAB — HEMOGLOBIN A1C: Hgb A1c MFr Bld: 5.7 % (ref 4.6–6.5)

## 2016-03-26 NOTE — Progress Notes (Signed)
Pre visit review using our clinic review tool, if applicable. No additional management support is needed unless otherwise documented below in the visit note. 

## 2016-03-26 NOTE — Patient Instructions (Signed)
GO TO THE LAB : Get the blood work     GO TO THE FRONT DESK Schedule your next appointment for a  physical exam, no fasting in 4 months   Diabetes: Check your blood sugar  once a day   at different times of the day  GOALS: Fasting before a meal 70- 130 2 hours after a meal less than 180 At bedtime 90-150 Call if consistently not at goal

## 2016-03-26 NOTE — Progress Notes (Addendum)
Subjective:    Patient ID: Bradley Sit., male    DOB: 04-Jul-1941, 75 y.o.   MRN: [id number]  DOS:  03/26/2016 Type of visit - description : Follow-up Interval history: COPD: Status post exacerbation, now back to baseline DM: Good med compliance, blood sugars usually in the 130s. Occasionally dropped to the 80s but no symptoms. Left knee injury, follow-up elsewhere, doing better. High cholesterol, on statins, due for FLP CHF: Good compliance with medications, due for a BMP.   Review of Systems  Denies fever chills No chest pain, breathing at baseline, occasional cough with no hemoptysis. No nausea, vomiting, diarrhea  Past Medical History  Diagnosis Date  . HTN (hypertension)   . HLD (hyperlipidemia)   . BPH (benign prostatic hyperplasia)     Saw Dr [name] 2004, normal renal u/s  . Pleural effusion 2008    S/p decortication  . Insomnia   . Atrial fibrillation (HCC)     s/p AV node ablation & BiV PPM implantation 09/08/11 (op dictation pending)  . Pulmonary HTN (HCC)     per cath 2008  . Peripheral vascular disease (HCC)     ??  . Anemia     intermittent  . Pacemaker   . Heart murmur   . Type II diabetes mellitus (HCC) 1999  . On home oxygen therapy     at night as needed  . 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 CAD (coronary artery disease)   . Abnormal CT scan, chest 2012    CT chest, several lymphadenopathies. Sees pulmonary  . CHF (congestive heart failure) (HCC)     Thought primarily to be non-systolic although EF down (EF 17-49% 12/2010, down to 35-40% 09/05/11), cath 2008 with no CAD, nuclear study 07/2011 showing Small area of reversibility in the distal ant/lat wall the left ventricle suspicious for ischemia/septal wall HK but felt to be low risk  (per D/C Summary 07/2011)  . Chronic bronchitis (HCC)     "get it ~ q yr"  . Migraines     "very very rare"  . Arthritis     "?back" (08/22/2014)    Past Surgical History    Procedure Laterality Date  . Pleural scarification    . Lung decortication    . Colonoscopy  03/10/11    normal  . Pneumothorax with fibrothorax  ~ 2010  . Insert / replace / remove pacemaker  09/08/11    pacemaker placement  . Tonsillectomy      "as a kid" (09/27/2012)  . Cardiac catheterization      "couple times; never had balloon or stent" (09/27/2012)  . Bi-ventricular pacemaker insertion Left 09/08/2011    Procedure: BI-VENTRICULAR PACEMAKER INSERTION (CRT-P);  Surgeon: Marinus Maw, MD;  Location: [facility] CATH LAB;  Service: Cardiovascular;  Laterality: Left;    Social History   Social History  . Marital Status: Married    Spouse Name: [name]  . Number of Children: 1  . 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 Smoking status: Former Smoker -- 2.50 packs/day for 25 years    Types: Cigarettes    Quit date: 10/11/1980  . Smokeless tobacco: Never Used     Comment: used to smoke 2.5 ppd for 25 years  . Alcohol Use: 0.0 oz/week    0 Standard drinks or equivalent per week  Comment: 08/22/2014 "maybe q couple months I'll have a beer w/dinner"  . Drug Use: No  . Sexual Activity: Not Currently   Other Topics Concern  . Not on file   Social History Narrative   Moved from [place] 2003.Marland Kitchen   He lives w/ his wife                  Medication List       This list is accurate as of: 03/26/16 11:59 PM.  Always use your most recent med list.               ADVICOR 1000-20 MG 24 hr tablet  Generic drug:  niacin-lovastatin  Take 1 tablet by mouth at bedtime.     albuterol 108 (90 Base) MCG/ACT inhaler  Commonly known as:  PROVENTIL HFA;VENTOLIN HFA  Inhale 2 puffs into the lungs every 4 (four) hours as needed for wheezing or shortness of breath.     amLODipine 10 MG tablet  Commonly known as:  NORVASC  Take 1 tablet (10 mg total) by mouth daily.     aspirin 81 MG tablet  Take 81 mg by mouth  every morning.     calcium-vitamin D 500-200 MG-UNIT tablet  Commonly known as:  OSCAL WITH D  Take 1 tablet by mouth every morning.     carvedilol 25 MG tablet  Commonly known as:  COREG  Take 1.5 tablets (37.5 mg total) by mouth 2 (two) times daily with a meal.     dextromethorphan-guaiFENesin 30-600 MG 12hr tablet  Commonly known as:  MUCINEX DM  Take 1 tablet by mouth 2 (two) times daily as needed for cough.     furosemide 40 MG tablet  Commonly known as:  LASIX  Take 1 1/2 tablets by mouth twice daily and as needed     glucose blood test strip  Commonly known as:  ONE TOUCH ULTRA TEST  Check blood sugar no more than twice daily.     glyBURIDE 5 MG tablet  Commonly known as:  DIABETA  Take 0.5 tablets (2.5 mg total) by mouth daily with breakfast.     losartan 100 MG tablet  Commonly known as:  COZAAR  Take 1 tablet (100 mg total) by mouth daily.     multivitamins ther. w/minerals Tabs tablet  Take 1 tablet by mouth daily.     pantoprazole 40 MG tablet  Commonly known as:  PROTONIX  Take 1 tablet (40 mg total) by mouth every morning.     sitaGLIPtin 100 MG tablet  Commonly known as:  JANUVIA  Take 1 tablet (100 mg total) by mouth daily.     tamsulosin 0.4 MG Caps capsule  Commonly known as:  FLOMAX  TAKE 1 CAPSULE DAILY     TYLENOL PO  Take 1 tablet by mouth 2 (two) times daily as needed (pain).     zolpidem 10 MG tablet  Commonly known as:  AMBIEN  Take 1 tablet (10 mg total) by mouth at bedtime as needed for sleep.           Objective:   Physical Exam BP 126/74 mmHg  Pulse 72  Temp(Src) 97.6 F (36.4 C) (Oral)  Ht  (1.753 m)  Wt 218 lb 8 oz (99.111 kg)  BMI 32.25 kg/m2  SpO2 94% General:   Well developed, well nourished . NAD.  HEENT:  Normocephalic . Face symmetric, atraumatic Lungs:  Crease breath sounds but clear Normal respiratory effort, no  intercostal retractions, no accessory muscle use. Heart: RRR,  no murmur.  Trace  pretibial edema bilaterally  Skin: Not pale. Not jaundice MSK: Brace on the left knee Neurologic:  alert & oriented X3.  Speech normal, gait appropriate for age and unassisted Psych--  Cognition and judgment appear intact.  Cooperative with normal attention span and concentration.  Behavior appropriate. No anxious or depressed appearing.      Assessment & Plan:   Assessment > DM HTN Hyperlipidemia Anxiety- insomnia  BPH Cardiovascular:Dr Ladona Ridgel  --CAD --CHF --Atrial fibrillation --Pacemaker --h/o IC bleeding: Not anticoagulated --Peripheral vascular disease? Pulmonary: Dr [name] --COPD, severe, chronic resp failure, night O2 prn, has portable O2 --Abnormal CT chest, multiple lymphadenopathies. --Pleural effusion s/p decortication --Pulmonary hypertension  Stasis dermatitis   PLAN: COPD, respiratory failure, chronic: Back to baseline. DM: Occasional low sugar without symptoms, continue glyburide, Januvia, check A1c CHF: Continue losartan, Lasix, carvedilol, check the BMP Hyperlipidemia: Continue present care, check a FLP Knee injury: Sees a sports medicine, had physical therapy, getting better. Taking advil 3 pills twice a day, recommend to take Tylenol instead and minimize the use of advil RTC 4 months, CPX

## 2016-03-27 NOTE — Assessment & Plan Note (Signed)
COPD, respiratory failure, chronic: Back to baseline. DM: Occasional low sugar without symptoms, continue glyburide, Januvia, check A1c CHF: Continue losartan, Lasix, carvedilol, check the BMP Hyperlipidemia: Continue present care, check a FLP Knee injury: Sees a sports medicine, had physical therapy, getting better. Taking advil 3 pills twice a day, recommend to take Tylenol instead and minimize the use of advil RTC 4 months, CPX

## 2016-04-02 MED ORDER — ATORVASTATIN CALCIUM 20 MG PO TABS: 20.0000 mg | ORAL_TABLET | Freq: Every day | ORAL | Status: DC

## 2016-04-02 NOTE — Addendum Note (Signed)
Addended by: Conrad [place] D on: 04/02/2016 03:36 PM   Modules accepted: Orders, Medications

## 2016-04-02 NOTE — Addendum Note (Signed)
Addended by: Conrad [place] D on: 04/02/2016 03:34 PM   Modules accepted: Orders, Medications

## 2016-04-08 ENCOUNTER — Telehealth: Payer: Self-pay | Admitting: Internal Medicine

## 2016-04-08 MED ORDER — ATORVASTATIN CALCIUM 20 MG PO TABS: 20.0000 mg | ORAL_TABLET | Freq: Every day | ORAL | Status: DC

## 2016-04-08 NOTE — Telephone Encounter (Signed)
Notes Recorded by Conrad [place], CMA on 04/02/2016 at 3:36 PM Med list updated. Notes Recorded by Conrad [place], CMA on 04/02/2016 at 3:36 PM Unable to contact Pt via telephone. Letter printed and mailed to Pt w/ Lipitor Rx. Instructed Pt to call and schedule lab appt for 2 months. AST, ALT, FLP ordered. Notes Recorded by Conrad Parker, CMA on 03/31/2016 at 12:01 PM Tried calling Pt again, still no answer. Unable to leave message. Notes Recorded by Conrad Piffard, CMA on 03/29/2016 at 11:05 AM Tried calling Pt, no answer, unable to leave message. Will try again later. Notes Recorded by [name], MD on 03/29/2016 at 10:10 AM LDL is 89, goal is 70. Call patient: Recommend to discontinue Advicor, start Lipitor 20 mg 1 po qd, #30 and 3 refills, send a prescription Also okay to stop glyburide. Arrange for FLP, AST, ALT in 2 months from today.

## 2016-04-08 NOTE — Telephone Encounter (Signed)
Spoke w/ Pt, informed him of below, to d/c Advicor and Glyburide, and start Lipitor. 15 day supply sent to [facility] and 90 day supply sent to [facility]. He requested to have repeat labs in September when his wife returns to office to see [name]. Lab appt scheduled for 07/09/2016 for FLP, AST, ALT.

## 2016-04-08 NOTE — Telephone Encounter (Signed)
°  Relationship to patient: Self Can be reached: [phone]     Reason for call: Request call back to find out if he needs to take the medication that he received a Rx for. Plse adv

## 2016-04-21 ENCOUNTER — Other Ambulatory Visit: Payer: Self-pay

## 2016-04-21 MED ORDER — AMLODIPINE BESYLATE 10 MG PO TABS: 10.0000 mg | ORAL_TABLET | Freq: Every day | ORAL | Status: DC

## 2016-04-21 MED ORDER — CARVEDILOL 25 MG PO TABS: 37.5000 mg | ORAL_TABLET | Freq: Two times a day (BID) | ORAL | Status: DC

## 2016-05-25 ENCOUNTER — Encounter: Payer: Self-pay | Admitting: Internal Medicine

## 2016-05-25 ENCOUNTER — Ambulatory Visit (INDEPENDENT_AMBULATORY_CARE_PROVIDER_SITE_OTHER): Payer: Medicare Other | Admitting: Internal Medicine

## 2016-05-25 DIAGNOSIS — I495 Sick sinus syndrome: Secondary | ICD-10-CM | POA: Diagnosis not present

## 2016-05-25 NOTE — Patient Instructions (Signed)
Medication Instructions:  Your physician recommends that you continue on your current medications as directed. Please refer to the Current Medication list given to you today.   Labwork: None ordered   Testing/Procedures: None ordered   Follow-Up: Remote monitoring is used to monitor your Pacemaker from home. This monitoring reduces the number of office visits required to check your device to one time per year. It allows us to keep an eye on the functioning of your device to ensure it is working properly. You are scheduled for a device check from home on 08/24/16. You may send your transmission at any time that day. If you have a wireless device, the transmission will be sent automatically. After your physician reviews your transmission, you will receive a postcard with your next transmission date.  Your physician wants you to follow-up in: 12 months with Dr Taylor You will receive a reminder letter in the mail two months in advance. If you don't receive a letter, please call our office to schedule the follow-up appointment.     Any Other Special Instructions Will Be Listed Below (If Applicable).     If you need a refill on your cardiac medications before your next appointment, please call your pharmacy.   

## 2016-05-25 NOTE — Progress Notes (Signed)
HPI [name] returns today for followup. He is a 75 year old man with chronic atrial fibrillation, nonischemic cardiomyopathy, status post AV node ablation and biventricular pacemaker insertion. In the interim, the patient has been a bit frustrated as he is caring for his wife who has broken her leg. His heart failure symptoms are well compensated. He notes that his weight is controlled. He has tried to improve his sodium intake but notes that it is difficult with his wife unable to help around the help. Allergies  Allergen Reactions  . Hydrocodone Other (See Comments)    "given to him in the hospital; went thru withdrawals once home; dr said not to take it again" (09/27/2012)  . Tramadol Other (See Comments)    "given to him in the hospital; went thru withdrawals once home; dr said not to take it again" (09/27/2012)  . Tadalafil Other (See Comments)    headache, backache  . Avelox [Moxifloxacin Hydrochloride] Itching and Other (See Comments)    Headache     Current Outpatient Prescriptions  Medication Sig Dispense Refill  . Acetaminophen (TYLENOL PO) Take 1 tablet by mouth 2 (two) times daily as needed (pain).    Marland Kitchen. albuterol (PROVENTIL HFA;VENTOLIN HFA) 108 (90 Base) MCG/ACT inhaler Inhale 2 puffs into the lungs every 4 (four) hours as needed for wheezing or shortness of breath. 1 Inhaler 2  . amLODipine (NORVASC) 10 MG tablet Take 1 tablet (10 mg total) by mouth daily. 90 tablet 2  . aspirin 81 MG tablet Take 81 mg by mouth every morning.     Marland Kitchen. atorvastatin (LIPITOR) 20 MG tablet Take 1 tablet (20 mg total) by mouth daily. 90 tablet 1  . calcium-vitamin D (OSCAL WITH D) 500-200 MG-UNIT tablet Take 1 tablet by mouth every morning.    . carvedilol (COREG) 25 MG tablet Take 1.5 tablets (37.5 mg total) by mouth 2 (two) times daily with a meal. 270 tablet 2  . dextromethorphan-guaiFENesin (MUCINEX DM) 30-600 MG 12hr tablet Take 1 tablet by mouth 2 (two) times daily as needed for cough. 10  tablet 0  . furosemide (LASIX) 40 MG tablet Take 1 1/2 tablets by mouth twice daily and as needed 270 tablet 1  . glucose blood (ONE TOUCH ULTRA TEST) test strip Check blood sugar no more than twice daily. 100 each 12  . losartan (COZAAR) 100 MG tablet Take 1 tablet (100 mg total) by mouth daily. 90 tablet 2  . Multiple Vitamins-Minerals (MULTIVITAMINS THER. W/MINERALS) TABS tablet Take 1 tablet by mouth daily. 30 each 5  . pantoprazole (PROTONIX) 40 MG tablet Take 1 tablet (40 mg total) by mouth every morning. 90 tablet 2  . sitaGLIPtin (JANUVIA) 100 MG tablet Take 1 tablet (100 mg total) by mouth daily. 90 tablet 2  . tamsulosin (FLOMAX) 0.4 MG CAPS capsule TAKE 1 CAPSULE DAILY 90 capsule 1  . zolpidem (AMBIEN) 10 MG tablet Take 1 tablet (10 mg total) by mouth at bedtime as needed for sleep. 90 tablet 1  . glyBURIDE (DIABETA) 5 MG tablet Take 5 mg by mouth daily.     No current facility-administered medications for this visit.      Past Medical History:  Diagnosis Date  . Abnormal CT scan, chest 2012   CT chest, several lymphadenopathies. Sees pulmonary  . Anemia    intermittent  . Arthritis    "?back" (08/22/2014)  . Atrial fibrillation (HCC)    s/p AV node ablation & BiV PPM implantation 09/08/11 (op dictation pending)  .  BPH (benign prostatic hyperplasia)    Saw Dr [name] 2004, normal renal u/s  . CAD (coronary artery disease)   . CHF (congestive heart failure) (HCC)    Thought primarily to be non-systolic although EF down (EF 16-10% 12/2010, down to 35-40% 09/05/11), cath 2008 with no CAD, nuclear study 07/2011 showing Small area of reversibility in the distal ant/lat wall the left ventricle suspicious for ischemia/septal wall HK but felt to be low risk  (per D/C Summary 07/2011)  . Chronic bronchitis (HCC)    "get it ~ q yr"  . Heart murmur   . HLD (hyperlipidemia)   . HTN (hypertension)   . 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 Insomnia    . Migraines    "very very rare"  . On home oxygen therapy    at night as needed  . Pacemaker   . Peripheral vascular disease (HCC)    ??  . Pleural effusion 2008   S/p decortication  . Pulmonary HTN (HCC)    per cath 2008  . Type II diabetes mellitus (HCC) 1999    ROS:   All systems reviewed and negative except as noted in the HPI.   Past Surgical History:  Procedure Laterality Date  . BI-VENTRICULAR PACEMAKER INSERTION Left 09/08/2011   Procedure: BI-VENTRICULAR PACEMAKER INSERTION (CRT-P);  Surgeon: Marinus Maw, MD;  Location: [facility] CATH LAB;  Service: Cardiovascular;  Laterality: Left;  . CARDIAC CATHETERIZATION     "couple times; never had balloon or stent" (09/27/2012)  . COLONOSCOPY  03/10/11   normal  . INSERT / REPLACE / REMOVE PACEMAKER  09/08/11   pacemaker placement  . LUNG DECORTICATION    . PLEURAL SCARIFICATION    . pneumothorax with fibrothorax  ~ 2010  . TONSILLECTOMY     "as a kid" (09/27/2012)     Family History  Problem Relation Age of Onset  . Diabetes Father   . Breast cancer Maternal Aunt   . Prostate cancer Neg Hx   . Colon cancer Neg Hx   . Coronary artery disease Father   . Polycythemia Mother      Social History   Social History  . Marital status: Married    Spouse name: Bradley [name]  . Number of children: 1  . Years of education: N/A   Occupational History  . 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 Smoking status: Former Smoker    Packs/day: 2.50    Years: 25.00    Types: Cigarettes    Quit date: 10/11/1980  . Smokeless tobacco: Never Used     Comment: used to smoke 2.5 ppd for 25 years  . Alcohol use 0.0 oz/week     Comment: 08/22/2014 "maybe q couple months I'll have a beer w/dinner"  . Drug use: No  . Sexual activity: Not Currently   Other Topics Concern  . Not on file   Social History Narrative   Moved from [place] 2003.Marland Kitchen   He lives w/ his wife               BP 114/68   Pulse 64   Ht  5\' 9"  (1.753 m)   Wt 205 lb (93 kg)   BMI 30.27 kg/m   Physical Exam:  Well appearing 75 year old man,NAD HEENT: Unremarkable Neck:  6 cmJVD, no thyromegally Lungs:  Clear with no rales, or rhonchi. Scattered wheezes HEART:  Regular rate rhythm, no murmurs, no rubs,  no clicks Abd:  soft, positive bowel sounds, no organomegally, no rebound, no guarding Ext:  2 plus pulses, trace edema, no cyanosis, no clubbing Skin:  No rashes no nodules Neuro:  CN II through XII intact, motor grossly intact   DEVICE  Normal device function.  See PaceArt for details.   Assess/Plan: 1. Atrial fib - his ventricular rate is well controlled. He will continue his current meds. 2. PPM - his medtronic biv PPM is working normally. 3. Chronic systolic heart failure - his symptoms remain class 2.   Leonia Reeves.D.

## 2016-05-26 LAB — CUP PACEART INCLINIC DEVICE CHECK
Battery Remaining Longevity: 63 mo
Battery Voltage: 3 V
Brady Statistic AP VP Percent: 0 %
Brady Statistic AP VS Percent: 0 %
Brady Statistic AS VP Percent: 97.8 %
Brady Statistic AS VS Percent: 2.2 %
Brady Statistic RA Percent Paced: 0 %
Brady Statistic RV Percent Paced: 97.8 %
Date Time Interrogation Session: 20170816000524
Implantable Lead Implant Date: 20121128
Implantable Lead Implant Date: 20121128
Implantable Lead Location: 753858
Implantable Lead Location: 753860
Implantable Lead Model: 419488
Implantable Lead Model: 5076
Lead Channel Impedance Value: 342 Ohm
Lead Channel Impedance Value: 4047 Ohm
Lead Channel Impedance Value: 4047 Ohm
Lead Channel Impedance Value: 437 Ohm
Lead Channel Impedance Value: 494 Ohm
Lead Channel Impedance Value: 513 Ohm
Lead Channel Impedance Value: 589 Ohm
Lead Channel Impedance Value: 589 Ohm
Lead Channel Impedance Value: 627 Ohm
Lead Channel Pacing Threshold Amplitude: 0.75 V
Lead Channel Pacing Threshold Amplitude: 1 V
Lead Channel Pacing Threshold Pulse Width: 0.4 ms
Lead Channel Pacing Threshold Pulse Width: 0.4 ms
Lead Channel Setting Pacing Amplitude: 2 V
Lead Channel Setting Pacing Amplitude: 2 V
Lead Channel Setting Pacing Pulse Width: 0.4 ms
Lead Channel Setting Pacing Pulse Width: 0.4 ms
Lead Channel Setting Sensing Sensitivity: 4 mV

## 2016-06-08 ENCOUNTER — Other Ambulatory Visit: Payer: Self-pay | Admitting: Internal Medicine

## 2016-06-19 ENCOUNTER — Encounter (HOSPITAL_BASED_OUTPATIENT_CLINIC_OR_DEPARTMENT_OTHER): Payer: Self-pay

## 2016-06-19 ENCOUNTER — Emergency Department (HOSPITAL_BASED_OUTPATIENT_CLINIC_OR_DEPARTMENT_OTHER): Payer: Medicare Other

## 2016-06-19 ENCOUNTER — Emergency Department (HOSPITAL_BASED_OUTPATIENT_CLINIC_OR_DEPARTMENT_OTHER)
Admission: EM | Admit: 2016-06-19 | Discharge: 2016-06-19 | Disposition: A | Discharge status: Home / Self Care | Payer: Medicare Other | Attending: Emergency Medicine | Admitting: Emergency Medicine

## 2016-06-19 DIAGNOSIS — Z7982 Long term (current) use of aspirin: Secondary | ICD-10-CM | POA: Diagnosis not present

## 2016-06-19 DIAGNOSIS — Z7984 Long term (current) use of oral hypoglycemic drugs: Secondary | ICD-10-CM | POA: Diagnosis not present

## 2016-06-19 DIAGNOSIS — Z79899 Other long term (current) drug therapy: Secondary | ICD-10-CM | POA: Diagnosis not present

## 2016-06-19 DIAGNOSIS — E119 Type 2 diabetes mellitus without complications: Secondary | ICD-10-CM | POA: Diagnosis not present

## 2016-06-19 DIAGNOSIS — Y9289 Other specified places as the place of occurrence of the external cause: Secondary | ICD-10-CM | POA: Diagnosis not present

## 2016-06-19 DIAGNOSIS — Y9301 Activity, walking, marching and hiking: Secondary | ICD-10-CM | POA: Insufficient documentation

## 2016-06-19 DIAGNOSIS — S6991XA Unspecified injury of right wrist, hand and finger(s), initial encounter: Secondary | ICD-10-CM | POA: Diagnosis present

## 2016-06-19 DIAGNOSIS — Y999 Unspecified external cause status: Secondary | ICD-10-CM | POA: Insufficient documentation

## 2016-06-19 DIAGNOSIS — S62619A Displaced fracture of proximal phalanx of unspecified finger, initial encounter for closed fracture: Secondary | ICD-10-CM

## 2016-06-19 DIAGNOSIS — S62614A Displaced fracture of proximal phalanx of right ring finger, initial encounter for closed fracture: Secondary | ICD-10-CM | POA: Diagnosis not present

## 2016-06-19 DIAGNOSIS — W010XXA Fall on same level from slipping, tripping and stumbling without subsequent striking against object, initial encounter: Secondary | ICD-10-CM | POA: Diagnosis not present

## 2016-06-19 DIAGNOSIS — I110 Hypertensive heart disease with heart failure: Secondary | ICD-10-CM | POA: Insufficient documentation

## 2016-06-19 DIAGNOSIS — I509 Heart failure, unspecified: Secondary | ICD-10-CM | POA: Insufficient documentation

## 2016-06-19 DIAGNOSIS — I2510 Atherosclerotic heart disease of native coronary artery without angina pectoris: Secondary | ICD-10-CM | POA: Insufficient documentation

## 2016-06-19 DIAGNOSIS — Z87891 Personal history of nicotine dependence: Secondary | ICD-10-CM | POA: Insufficient documentation

## 2016-06-19 DIAGNOSIS — W19XXXA Unspecified fall, initial encounter: Secondary | ICD-10-CM

## 2016-06-19 MED ORDER — LIDOCAINE HCL (PF) 1 % IJ SOLN
5.0000 mL | Freq: Once | INTRAMUSCULAR | Status: AC
  Administered 2016-06-19: 5 mL via INTRADERMAL
  Filled 2016-06-19: qty 5

## 2016-06-19 NOTE — ED Provider Notes (Signed)
[facility] Provider Note   CSN: [id number] Arrival date & time: 06/19/16  1616  By signing my name below, I, Modena Jansky, attest that this documentation has been prepared under the direction and in the presence of non-physician practitioner, Bejamin Bessye Stith, PA-C. Electronically Signed: Modena Jansky, Scribe. 06/19/2016. 4:57 PM.  History   Chief Complaint Chief Complaint  Patient presents with  . Hand Injury   The history is provided by the patient. No language interpreter was used.   HPI Comments: Bradley [name] Sr. is a 75 y.o. male who presents to the Emergency Department complaining of a right hand injury that occurred about an hour ago. He reports falling forward onto his right pinky finger on concrete. Denies head injury or LOC. Associated symptoms include constant moderate pain and a wound to injury site. He washed the wound and iced the affected area PTA. Reports a hx of HTN. Denies numbness/tingling.   Past Medical History:  Diagnosis Date  . Abnormal CT scan, chest 2012   CT chest, several lymphadenopathies. Sees pulmonary  . Anemia    intermittent  . Arthritis    "?back" (08/22/2014)  . Atrial fibrillation (HCC)    s/p AV node ablation & BiV PPM implantation 09/08/11 (op dictation pending)  . BPH (benign prostatic hyperplasia)    Saw Dr [name] 2004, normal renal u/s  . CAD (coronary artery disease)   . CHF (congestive heart failure) (HCC)    Thought primarily to be non-systolic although EF down (EF 40-98% 12/2010, down to 35-40% 09/05/11), cath 2008 with no CAD, nuclear study 07/2011 showing Small area of reversibility in the distal ant/lat wall the left ventricle suspicious for ischemia/septal wall HK but felt to be low risk  (per D/C Summary 07/2011)  . Chronic bronchitis (HCC)    "get it ~ q yr"  . Heart murmur   . HLD (hyperlipidemia)   . HTN (hypertension)   . 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 Insomnia   .  Migraines    "very very rare"  . On home oxygen therapy    at night as needed  . Pacemaker   . Peripheral vascular disease (HCC)    ??  . Pleural effusion 2008   S/p decortication  . Pulmonary HTN (HCC)    per cath 2008  . Type II diabetes mellitus (HCC) 1999    Patient Active Problem List   Diagnosis Date Noted  . Left knee injury 02/10/2016  . Right wrist sprain 02/10/2016  . Acute on chronic respiratory failure with hypoxia (HCC) 12/19/2015  . PCP NOTES >>> 07/15/2015  . COPD exacerbation (HCC) 03/12/2015  . Acute bronchitis vs Upper Airway cough syndrome 09/07/2014  . CAD (coronary artery disease) of artery bypass graft 09/14/2013  . Acute on chronic combined systolic and diastolic congestive heart failure (HCC) 09/27/2012  . Pain in joints 07/11/2012  . Annual physical exam 05/08/2012  . 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 Lymphadenopathy 09/23/2011  . Sinoatrial node dysfunction (HCC)   . 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 Systolic and diastolic CHF, acute (HCC) 09/04/2011  . Chronic diastolic heart failure (HCC)   . CLAUDICATION 10/22/2010  . DERMATITIS, STASIS 09/14/2010  . Chronic respiratory failure (HCC) 09/14/2010  . Chronic pulmonary hypertension secondary to elevated L H pressures  04/15/2009  . ERECTILE DYSFUNCTION 01/06/2009  . Hyperlipidemia 12/13/2007  . Essential hypertension 12/13/2007  . Anxiety--insomnia 08/14/2007  . DM II (diabetes mellitus, type II), controlled (HCC) 01/02/2007  .  Permanent atrial fibrillation 01/02/2007  . BENIGN PROSTATIC HYPERTROPHY 01/02/2007    Past Surgical History:  Procedure Laterality Date  . BI-VENTRICULAR PACEMAKER INSERTION Left 09/08/2011   Procedure: BI-VENTRICULAR PACEMAKER INSERTION (CRT-P);  Surgeon: Marinus Maw, MD;  Location: [facility] CATH LAB;  Service: Cardiovascular;  Laterality: Left;  . CARDIAC CATHETERIZATION     "couple times; never had balloon or stent" (09/27/2012)  . COLONOSCOPY  03/10/11    normal  . INSERT / REPLACE / REMOVE PACEMAKER  09/08/11   pacemaker placement  . LUNG DECORTICATION    . PLEURAL SCARIFICATION    . pneumothorax with fibrothorax  ~ 2010  . TONSILLECTOMY     "as a kid" (09/27/2012)       Home Medications    Prior to Admission medications   Medication Sig Start Date End Date Taking? Authorizing Provider  Acetaminophen (TYLENOL PO) Take 1 tablet by mouth 2 (two) times daily as needed (pain).    Historical Provider, MD  albuterol (PROVENTIL HFA;VENTOLIN HFA) 108 (90 Base) MCG/ACT inhaler Inhale 2 puffs into the lungs every 4 (four) hours as needed for wheezing or shortness of breath. 12/20/15   Costin Otelia Sergeant, MD  amLODipine (NORVASC) 10 MG tablet Take 1 tablet (10 mg total) by mouth daily. 04/21/16   [name], MD  aspirin 81 MG tablet Take 81 mg by mouth every morning.     Historical Provider, MD  atorvastatin (LIPITOR) 20 MG tablet Take 1 tablet (20 mg total) by mouth daily. 04/08/16   [name], MD  calcium-vitamin D (OSCAL WITH D) 500-200 MG-UNIT tablet Take 1 tablet by mouth every morning.    Historical Provider, MD  carvedilol (COREG) 25 MG tablet Take 1.5 tablets (37.5 mg total) by mouth 2 (two) times daily with a meal. 04/21/16   [name], MD  dextromethorphan-guaiFENesin [facility] DM) 30-600 MG 12hr tablet Take 1 tablet by mouth 2 (two) times daily as needed for cough. 12/20/15   Costin Otelia Sergeant, MD  furosemide (LASIX) 40 MG tablet Take 1 1/2 tablets by mouth twice daily and as needed 12/25/15   Marinus Maw, MD  glucose blood (ONE TOUCH ULTRA TEST) test strip Check blood sugar no more than twice daily. 06/06/15   [name], MD  glyBURIDE (DIABETA) 5 MG tablet Take 5 mg by mouth daily. 04/05/16   Historical Provider, MD  losartan (COZAAR) 100 MG tablet Take 1 tablet (100 mg total) by mouth daily. 06/08/16   [name], MD  Multiple Vitamins-Minerals (MULTIVITAMINS THER. W/MINERALS) TABS tablet Take 1 tablet by mouth daily. 09/14/13   [name], MD   pantoprazole (PROTONIX) 40 MG tablet Take 1 tablet (40 mg total) by mouth every morning. 12/29/15   [name], MD  sitaGLIPtin (JANUVIA) 100 MG tablet Take 1 tablet (100 mg total) by mouth daily. 09/22/15   [name], MD  tamsulosin (FLOMAX) 0.4 MG CAPS capsule TAKE 1 CAPSULE DAILY 02/02/16   [name], MD  zolpidem (AMBIEN) 10 MG tablet Take 1 tablet (10 mg total) by mouth at bedtime as needed for sleep. 02/17/16   [name], MD    Family History Family History  Problem Relation Age of Onset  . Diabetes Father   . Coronary artery disease Father   . Polycythemia Mother   . Breast cancer Maternal Aunt   . Prostate cancer Neg Hx   . Colon cancer Neg Hx     Social  History Social History  Substance Use Topics  . Smoking status: Former Smoker    Packs/day: 2.50    Years: 25.00    Types: Cigarettes    Quit date: 10/11/1980  . Smokeless tobacco: Never Used     Comment: used to smoke 2.5 ppd for 25 years  . Alcohol use No     Comment: 08/22/2014 "maybe q couple months I'll have a beer w/dinner"     Allergies   Hydrocodone; Tramadol; Tadalafil; and Avelox [moxifloxacin hydrochloride]   Review of Systems Review of Systems A complete 10 system review of systems was obtained and all systems are negative except as noted in the HPI and PMH.   Physical Exam Updated Vital Signs BP 125/76 (BP Location: Right Arm)   Pulse 89   Temp 97.9 F (36.6 C) (Oral)   Resp 18   Ht 5\' 9"  (1.753 m)   Wt 210 lb (95.3 kg)   SpO2 92%   BMI 31.01 kg/m   Physical Exam  Constitutional: He appears well-developed and well-nourished. No distress.  HENT:  Head: Normocephalic and atraumatic.  Eyes: Conjunctivae are normal.  Neck: Neck supple.  Cardiovascular: Normal rate.   Pulmonary/Chest: Effort normal.  Abdominal: Soft.  Musculoskeletal: Normal range of motion.  Neurological: He is alert.  Skin: Skin is warm and dry.  Lateral aspect of right MCP, 5th digit has a small minimal abrasion.  Distal pulse is +2. Brisk capillary refill. Sensation to light touch intact.   Psychiatric: He has a normal mood and affect.  Nursing note and vitals reviewed.    ED Treatments / Results  DIAGNOSTIC STUDIES: Oxygen Saturation is 92% on 2L oxygen, normal by my interpretation.    COORDINATION OF CARE: 5:01 PM- Pt advised of plan for treatment and pt agrees.  Labs (all labs ordered are listed, but only abnormal results are displayed) Labs Reviewed - No data to display  EKG  EKG Interpretation None       Radiology Dg Finger Little Right  Result Date: 06/19/2016 CLINICAL DATA:  Fall today walking the dog EXAM: RIGHT LITTLE FINGER 2+V COMPARISON:  None. FINDINGS: Three views of the right fifth finger submitted. There is angulated fracture of proximal shaft proximal phalanx fifth finger. IMPRESSION: Angulated fracture proximal phalanx right fifth finger. Electronically Signed   By: Bradley [name] M.D.   On: 06/19/2016 17:20    Procedures Reduction of fracture Date/Time: 06/19/2016 6:25 PM Performed by: Bradley [name] Authorized by: Bradley [name]  Consent: Verbal consent obtained. Risks and benefits: risks, benefits and alternatives were discussed Consent given by: patient Patient understanding: patient states understanding of the procedure being performed Patient consent: the patient's understanding of the procedure matches consent given Patient identity confirmed: verbally with patient Preparation: Patient was prepped and draped in the usual sterile fashion. Local anesthesia used: no  Anesthesia: Local anesthesia used: no  Sedation: Patient sedated: no Patient tolerance: Patient tolerated the procedure well with no immediate complications Comments: Successful reduction of right fifth digit proximal phalanx.    (including critical care time)   NERVE BLOCK Performed by: Sharlene Motts Consent: Verbal consent obtained. Required items: required blood products,  implants, devices, and special equipment available Time out: Immediately prior to procedure a "time out" was called to verify the correct patient, procedure, equipment, support staff and site/side marked as required.  Indication: Reduction  Nerve block body site: Right fifth digit   Preparation: Patient was prepped and draped in the usual sterile fashion. Needle gauge:  24 G Location technique: anatomical landmarks  Local anesthetic: Lidocaine 1%   Anesthetic total: 5 ml  Outcome: pain improved Patient tolerance: Patient tolerated the procedure well with no immediate complications.  SPLINT APPLICATION Date/Time: 11:23 PM Authorized by: Sharlene Mottsartner, Harmani Neto W Consent: Verbal consent obtained. Risks and benefits: risks, benefits and alternatives were discussed Consent given by: patient Splint applied by: knott Location details: R fifth digit Splint type: plaster ulnar gutter Supplies used: plaster Post-procedure: The splinted body part was neurovascularly unchanged following the procedure. Patient tolerance: Patient tolerated the procedure well with no immediate complications.       Medications Ordered in ED Medications  lidocaine (PF) (XYLOCAINE) 1 % injection 5 mL (not administered)     Initial Impression / Assessment and Plan / ED Course  I have reviewed the triage vital signs and the nursing notes.  Pertinent labs & imaging results that were available during my care of the patient were reviewed by me and considered in my medical decision making (see chart for details).  Clinical Course    X-ray shows a dorsally angulated fracture of proximal phalanx fifth digit. Successful reduction of bedside with subsequent splinting and buddy tape. Patient will be given referral to hand surgery for follow-up. Discussed return precautions. Also discussed medication precautions. Prior to patient discharge, I discussed and reviewed this case with Dr.Knott     Final Clinical  Impressions(s) / ED Diagnoses   Final diagnoses:  Fall  Phalanx, proximal fracture of finger, closed, initial encounter    New Prescriptions New Prescriptions   No medications on file   I personally performed the services described in this documentation, which was scribed in my presence. The recorded information has been reviewed and is accurate.    Bradley PeekBenjamin Coy Vandoren, PA-C 06/19/16 [id number]    Lyndal Pulleyaniel Knott, MD 06/20/16 [phone]

## 2016-06-19 NOTE — ED Triage Notes (Signed)
Patient reports tripping and falling while walking his dog approx 1-2hrs ago.  Reports that his pinky on his right hand was obviously deformed when he got up off the ground.  Patient buddy-taped pinky at home and drove to the hospital.  Abrasions are present on right hand. Patients SpO2 during triage was 84-88% on room air. Patient placed on 2L nasal canula and SpO2 increased to 92%. Denies LOC before or after fall. Denies hitting his head or back. Denies blood thinners.

## 2016-06-24 ENCOUNTER — Other Ambulatory Visit: Payer: Self-pay | Admitting: Internal Medicine

## 2016-06-24 DIAGNOSIS — I5022 Chronic systolic (congestive) heart failure: Secondary | ICD-10-CM

## 2016-06-25 ENCOUNTER — Other Ambulatory Visit: Payer: Self-pay

## 2016-06-25 DIAGNOSIS — I5022 Chronic systolic (congestive) heart failure: Secondary | ICD-10-CM

## 2016-06-25 MED ORDER — FUROSEMIDE 40 MG PO TABS: ORAL_TABLET | ORAL | 6 refills | Status: DC

## 2016-06-28 ENCOUNTER — Telehealth: Payer: Self-pay | Admitting: Internal Medicine

## 2016-06-28 NOTE — Telephone Encounter (Signed)
Medication refilled on 02/17/2016 #90 tabs and 1 refill to [facility].

## 2016-06-28 NOTE — Telephone Encounter (Signed)
Caller name: Relationship to patient: Self   Can be reached: [phone]   Pharmacy:  [facility] - [place], [place] - [address] [phone] (Phone) [phone] (Fax)     Reason for call: Request Rx for zolpidem (AMBIEN) 10 MG tablet [[id number] [facility]

## 2016-06-30 ENCOUNTER — Other Ambulatory Visit: Payer: Self-pay | Admitting: Internal Medicine

## 2016-07-09 ENCOUNTER — Other Ambulatory Visit (INDEPENDENT_AMBULATORY_CARE_PROVIDER_SITE_OTHER): Payer: Medicare Other

## 2016-07-09 ENCOUNTER — Ambulatory Visit (INDEPENDENT_AMBULATORY_CARE_PROVIDER_SITE_OTHER): Payer: Medicare Other

## 2016-07-09 ENCOUNTER — Other Ambulatory Visit: Payer: Self-pay

## 2016-07-09 DIAGNOSIS — Z23 Encounter for immunization: Secondary | ICD-10-CM | POA: Diagnosis not present

## 2016-07-09 DIAGNOSIS — E785 Hyperlipidemia, unspecified: Secondary | ICD-10-CM

## 2016-07-09 LAB — ALT: ALT: 9 U/L (ref 0–53)

## 2016-07-09 LAB — AST: AST: 12 U/L (ref 0–37)

## 2016-07-09 LAB — LIPID PANEL
Cholesterol: 123 mg/dL (ref 0–200)
HDL: 33.5 mg/dL — ABNORMAL LOW (ref >39.00)
LDL Cholesterol: 69 mg/dL (ref 0–99)
NonHDL: 89.48
Total CHOL/HDL Ratio: 4
Triglycerides: 102 mg/dL (ref 0.0–149.0)
VLDL: 20.4 mg/dL (ref 0.0–40.0)

## 2016-07-09 MED ORDER — ZOLPIDEM TARTRATE 10 MG PO TABS: 10.0000 mg | ORAL_TABLET | Freq: Every evening | ORAL | 1 refills | Status: DC | PRN

## 2016-07-09 NOTE — Progress Notes (Signed)
Pre visit review using our clinic review tool, if applicable. No additional management support is needed unless otherwise documented below in the visit note. 

## 2016-07-09 NOTE — Progress Notes (Signed)
Pt tolerated injection well

## 2016-07-09 NOTE — Progress Notes (Signed)
Spoke w/ Dois Davenport at [facility]. They confirmed they did NOT receive Rx for Ambien on 02/17/2016. Rx printed for #90 and 1RF and faxed to [facility].

## 2016-07-31 ENCOUNTER — Other Ambulatory Visit: Payer: Self-pay | Admitting: Internal Medicine

## 2016-08-23 ENCOUNTER — Other Ambulatory Visit: Payer: Self-pay | Admitting: Internal Medicine

## 2016-08-23 ENCOUNTER — Telehealth: Payer: Self-pay | Admitting: Internal Medicine

## 2016-08-23 NOTE — Telephone Encounter (Signed)
Called pt and let him know that we did receive his transmission. Pt appreciative of the call back.

## 2016-08-23 NOTE — Telephone Encounter (Signed)
New message  Pt call requesting to speak with RN> pt wants to make sure his transmission was received. Please call back to discuss

## 2016-08-24 ENCOUNTER — Telehealth: Payer: Self-pay | Admitting: Cardiology

## 2016-08-24 ENCOUNTER — Ambulatory Visit (INDEPENDENT_AMBULATORY_CARE_PROVIDER_SITE_OTHER): Payer: Medicare Other | Admitting: *Deleted

## 2016-08-24 DIAGNOSIS — I495 Sick sinus syndrome: Secondary | ICD-10-CM | POA: Diagnosis not present

## 2016-08-24 NOTE — Progress Notes (Signed)
Remote pacemaker transmission.   

## 2016-08-24 NOTE — Telephone Encounter (Signed)
Spoke with pt and reminded pt of remote transmission that is due today. Pt verbalized understanding.   

## 2016-08-26 ENCOUNTER — Encounter: Payer: Self-pay | Admitting: Internal Medicine

## 2016-08-26 ENCOUNTER — Ambulatory Visit (INDEPENDENT_AMBULATORY_CARE_PROVIDER_SITE_OTHER): Payer: Medicare Other | Admitting: Internal Medicine

## 2016-08-26 VITALS — BP 124/74 | HR 89 | Temp 98.2°F | Resp 14 | Ht 69.0 in | Wt 201.0 lb

## 2016-08-26 DIAGNOSIS — Z0000 Encounter for general adult medical examination without abnormal findings: Secondary | ICD-10-CM | POA: Diagnosis not present

## 2016-08-26 DIAGNOSIS — I10 Essential (primary) hypertension: Secondary | ICD-10-CM | POA: Diagnosis not present

## 2016-08-26 DIAGNOSIS — E1159 Type 2 diabetes mellitus with other circulatory complications: Secondary | ICD-10-CM

## 2016-08-26 DIAGNOSIS — E785 Hyperlipidemia, unspecified: Secondary | ICD-10-CM | POA: Diagnosis not present

## 2016-08-26 LAB — BASIC METABOLIC PANEL
BUN: 13 mg/dL (ref 6–23)
CO2: 34 mEq/L — ABNORMAL HIGH (ref 19–32)
Calcium: 9.2 mg/dL (ref 8.4–10.5)
Chloride: 102 mEq/L (ref 96–112)
Creatinine, Ser: 0.87 mg/dL (ref 0.40–1.50)
GFR: 90.79 mL/min (ref >60.00)
Glucose, Bld: 114 mg/dL — ABNORMAL HIGH (ref 70–99)
Potassium: 4.2 mEq/L (ref 3.5–5.1)
Sodium: 142 mEq/L (ref 135–145)

## 2016-08-26 LAB — CBC WITH DIFFERENTIAL/PLATELET
Basophils Absolute: 0 10*3/uL (ref 0.0–0.1)
Basophils Relative: 0.3 % (ref 0.0–3.0)
Eosinophils Absolute: 0.1 10*3/uL (ref 0.0–0.7)
Eosinophils Relative: 1 % (ref 0.0–5.0)
HCT: 43.5 % (ref 39.0–52.0)
Hemoglobin: 14.6 g/dL (ref 13.0–17.0)
Lymphocytes Relative: 13.3 % (ref 12.0–46.0)
Lymphs Abs: 1.2 10*3/uL (ref 0.7–4.0)
MCHC: 33.7 g/dL (ref 30.0–36.0)
MCV: 90.3 fl (ref 78.0–100.0)
Monocytes Absolute: 0.8 10*3/uL (ref 0.1–1.0)
Monocytes Relative: 8.9 % (ref 3.0–12.0)
Neutro Abs: 7.1 10*3/uL (ref 1.4–7.7)
Neutrophils Relative %: 76.5 % (ref 43.0–77.0)
Platelets: 214 10*3/uL (ref 150.0–400.0)
RBC: 4.81 Mil/uL (ref 4.22–5.81)
RDW: 14.6 % (ref 11.5–15.5)
WBC: 9.2 10*3/uL (ref 4.0–10.5)

## 2016-08-26 LAB — LIPID PANEL
Cholesterol: 173 mg/dL (ref 0–200)
HDL: 41 mg/dL (ref >39.00)
LDL Cholesterol: 112 mg/dL — ABNORMAL HIGH (ref 0–99)
NonHDL: 131.65
Total CHOL/HDL Ratio: 4
Triglycerides: 96 mg/dL (ref 0.0–149.0)
VLDL: 19.2 mg/dL (ref 0.0–40.0)

## 2016-08-26 LAB — AST: AST: 13 U/L (ref 0–37)

## 2016-08-26 LAB — HEMOGLOBIN A1C: Hgb A1c MFr Bld: 5.9 % (ref 4.6–6.5)

## 2016-08-26 LAB — ALT: ALT: 10 U/L (ref 0–53)

## 2016-08-26 NOTE — Assessment & Plan Note (Addendum)
Td 2014; Pneumonia 2005, 2010;  Prevnar today;  Zostavax --> 2012;  Had a flu shot   Colon cancer screening: C-scope: 02-2011 normal Prostate cancer screening: Last PSA satisfactory. Has BPH, controlled on Flomax Diet, exercise discussed

## 2016-08-26 NOTE — Progress Notes (Signed)
Pre visit review using our clinic review tool, if applicable. No additional management support is needed unless otherwise documented below in the visit note. 

## 2016-08-26 NOTE — Progress Notes (Signed)
Subjective:    Patient ID: Bradley Sit., male    DOB: 11/29/1940, 75 y.o.   MRN: [id number]  DOS:  08/26/2016 Type of visit - description :     Here for Medicare AWV:   1. Risk factors based on Past M, S, F history: reviewed   2. Physical Activities: active, yard and home chores, takes walks sometimes   3. Depression/mood: neg screen 4. Hearing: has hearing aids, uses them more regulalrly   5. ADL's: independent , still drives 6. Fall Risk: had a fall, fx finger, injured knee, went to the ER, now better .prevention discussed, see AVS 7. Home Safety: does feel safe at home   8. Height, weight, &visual acuity: see VS, uses glasses no problems, sees the eye doctor  9. Counseling: yes   10. Labs ordered based on risk factors: yes   11. Referral Coordination, if needed   12. Care Plan, see a/p   13. Cognitive Assessment: motor, cognition, memory wnl  14. Care team is up to date  15. End of life care, rec a healthcare POA  in addition, we discussed the following DM: Good medication compliance, ambulatory CBGs when checked ~ 143. High cholesterol: Medications were adjusted, good compliance. HTN: Good med compliance, ambulatory BPs in the 150/70 range. Cardiology note reviewed   Review of Systems Constitutional: No fever. No chills. No unexplained wt changes. No unusual sweats  HEENT: No dental problems, no ear discharge, no facial swelling, no voice changes. No eye discharge, no eye  redness , no  intolerance to light   Respiratory: No wheezing , no  difficulty breathing. No cough , no mucus production  Cardiovascular: No CP, no leg swelling , no  Palpitations  GI: no nausea, no vomiting, no diarrhea , no  abdominal pain.  No blood in the stools. No dysphagia, no odynophagia    Endocrine: No polyphagia, no polyuria , no polydipsia  GU: No dysuria, gross hematuria, difficulty urinating. No urinary urgency, no frequency.  Musculoskeletal: No joint swellings or unusual  aches or pains  Skin: No change in the color of the skin, palor , no  Rash  Allergic, immunologic: No environmental allergies , no  food allergies  Neurological: No dizziness no  syncope. No headaches. No diplopia, no slurred, no slurred speech, no motor deficits, no facial  Numbness  Hematological: No enlarged lymph nodes, no easy bruising , no unusual bleedings  Psychiatry: No suicidal ideas, no hallucinations, no beavior problems, no confusion.  No unusual/severe anxiety, no depression   Past Medical History:  Diagnosis Date  . Abnormal CT scan, chest 2012   CT chest, several lymphadenopathies. Sees pulmonary  . Anemia    intermittent  . Arthritis    "?back" (08/22/2014)  . Atrial fibrillation (HCC)    s/p AV node ablation & BiV PPM implantation 09/08/11 (op dictation pending)  . BPH (benign prostatic hyperplasia)    Saw Dr [name] 2004, normal renal u/s  . CAD (coronary artery disease)   . CHF (congestive heart failure) (HCC)    Thought primarily to be non-systolic although EF down (EF 10-27% 12/2010, down to 35-40% 09/05/11), cath 2008 with no CAD, nuclear study 07/2011 showing Small area of reversibility in the distal ant/lat wall the left ventricle suspicious for ischemia/septal wall HK but felt to be low risk  (per D/C Summary 07/2011)  . Chronic bronchitis (HCC)    "get it ~ q yr"  . Heart murmur   . HLD (  hyperlipidemia)   . HTN (hypertension)   . 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 Insomnia   . Migraines    "very very rare"  . On home oxygen therapy    at night as needed  . Pacemaker   . Peripheral vascular disease (HCC)    ??  . Pleural effusion 2008   S/p decortication  . Pulmonary HTN    per cath 2008  . Type II diabetes mellitus (HCC) 1999    Past Surgical History:  Procedure Laterality Date  . BI-VENTRICULAR PACEMAKER INSERTION Left 09/08/2011   Procedure: BI-VENTRICULAR PACEMAKER INSERTION (CRT-P);  Surgeon: Marinus MawGregg W  Taylor, MD;  [facility];  Service: Cardiovascular;  Laterality: Left;  . CARDIAC CATHETERIZATION     "couple times; never had balloon or stent" (09/27/2012)  . COLONOSCOPY  03/10/11   normal  . INSERT / REPLACE / REMOVE PACEMAKER  09/08/11   pacemaker placement  . LUNG DECORTICATION    . PLEURAL SCARIFICATION    . pneumothorax with fibrothorax  ~ 2010  . TONSILLECTOMY     "as a kid" (09/27/2012)    Social History   Social History  . Marital status: Married    Spouse name: Bradley [name]  . Number of children: 1  . Years of education: N/A   Occupational History  . 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 Smoking status: Former Smoker    Packs/day: 2.50    Years: 25.00    Types: Cigarettes    Quit date: 10/11/1980  . Smokeless tobacco: Never Used     Comment: used to smoke 2.5 ppd for 25 years  . Alcohol use No     Comment: 08/22/2014 "maybe q couple months I'll have a beer w/dinner"  . Drug use: No  . Sexual activity: Not Currently   Other Topics Concern  . Not on file   Social History Narrative   [place]   He lives w/ his wife               Family History  Problem Relation Age of Onset  . Diabetes Father   . Coronary artery disease Father   . Polycythemia Mother   . Breast cancer Maternal Aunt   . Prostate cancer Neg Hx   . Colon cancer Neg Hx        Medication List       Accurate as of 08/26/16 11:59 PM. Always use your most recent med list.          albuterol 108 (90 Base) MCG/ACT inhaler Commonly known as:  PROVENTIL HFA;VENTOLIN HFA Inhale 2 puffs into the lungs every 4 (four) hours as needed for wheezing or shortness of breath.   amLODipine 10 MG tablet Commonly known as:  NORVASC Take 1 tablet (10 mg total) by mouth daily.   aspirin 81 MG tablet Take 81 mg by mouth every morning.   atorvastatin 20 MG tablet Commonly known as:  LIPITOR Take 1 tablet (20 mg total) by mouth daily.     calcium-vitamin D 500-200 MG-UNIT tablet Commonly known as:  OSCAL WITH D Take 1 tablet by mouth every morning.   carvedilol 25 MG tablet Commonly known as:  COREG Take 1.5 tablets (37.5 mg total) by mouth 2 (two) times daily with a meal.   dextromethorphan-guaiFENesin 30-600 MG 12hr tablet Commonly known as:  MUCINEX DM Take 1 tablet by mouth 2 (two)  times daily as needed for cough.   furosemide 40 MG tablet Commonly known as:  LASIX Take 1 1/2 tablets by mouth twice daily and as needed   losartan 100 MG tablet Commonly known as:  COZAAR Take 1 tablet (100 mg total) by mouth daily.   multivitamins ther. w/minerals Tabs tablet Take 1 tablet by mouth daily.   nitroGLYCERIN 0.4 MG SL tablet Commonly known as:  NITROSTAT DISSOLVE 1 TABLET UNDER THE TONGUE EVERY 5 MINUTES AS NEEDED FOR CHEST PAIN   ONE TOUCH ULTRA TEST test strip Generic drug:  glucose blood USE TO CHECK BLOOD SUGAR NO MORE THAN TWICE A DAY   pantoprazole 40 MG tablet Commonly known as:  PROTONIX Take 1 tablet (40 mg total) by mouth every morning.   sitaGLIPtin 100 MG tablet Commonly known as:  JANUVIA Take 1 tablet (100 mg total) by mouth daily.   tamsulosin 0.4 MG Caps capsule Commonly known as:  FLOMAX Take 1 capsule (0.4 mg total) by mouth daily.   TYLENOL PO Take 1 tablet by mouth 2 (two) times daily as needed (pain).   zolpidem 10 MG tablet Commonly known as:  AMBIEN Take 1 tablet (10 mg total) by mouth at bedtime as needed for sleep.          Objective:   Physical Exam BP 124/74 (BP Location: Left Arm, Patient Position: Sitting, Cuff Size: Normal)   Pulse 89   Temp 98.2 F (36.8 C) (Oral)   Resp 14   Ht 5\' 9"  (1.753 m)   Wt 201 lb (91.2 kg)   SpO2 92%   BMI 29.68 kg/m   General:   Well developed, well nourished . NAD.  Neck: No  thyromegaly  HEENT:  Normocephalic . Face symmetric, atraumatic Lungs:  CTA B Normal respiratory effort, no intercostal retractions, no accessory  muscle use. Heart: RRR,  no murmur.  No pretibial edema bilaterally  Abdomen:  Not distended, soft, non-tender. No rebound or rigidity.  No bruit DIABETIC FEET EXAM: No lower extremity edema Normal pedal pulses bilaterally Skin normal, nails dystrophic, no calluses Pinprick examination of the feet normal. Skin: Exposed areas without rash. Not pale. Not jaundice Neurologic:  alert & oriented X3.  Speech normal, gait appropriate for age and unassisted Strength symmetric and appropriate for age.  Psych: Cognition and judgment appear intact.  Cooperative with normal attention span and concentration.  Behavior appropriate. No anxious or depressed appearing.    Assessment & Plan:   Assessment > DM HTN Hyperlipidemia Anxiety- insomnia  BPH Cardiovascular:Dr Ladona Ridgel  --CAD -- CHF --Atrial fibrillation;  --Pacemaker --h/o IC bleeding: Not anticoagulated --Peripheral vascular disease? Pulmonary: Dr [name] --COPD, severe, chronic resp failure, night O2 prn, has portable O2 --Abnormal CT chest, multiple lymphadenopathies. --Pleural effusion s/p decortication --Pulmonary hypertension  Stasis dermatitis   PLAN: DM: Last A1c 5.7, glyburide d/c, continue with Januvia. Checking an A1c. HTN: Seems control, continue with losartan, Lasix, carvedilol, amlodipine. Check a BMP High cholesterol: Based on last FLP, Advicor was d/c, he is now on Lipitor. Check a FLP, AST, ALT A. fib, pacemaker: Saw cardiology 05-2016, note reviewed, stable. Fracture ,finger, saw orthopedic surgery, doing well. BPH: On Flomax, sx controlled. RTC 5-6 months

## 2016-08-26 NOTE — Patient Instructions (Signed)
Get your blood work before you leave   Next visit in 5 to 6 months     Fall Prevention and Home Safety Falls cause injuries and can affect all age groups. It is possible to use preventive measures to significantly decrease the likelihood of falls. There are many simple measures which can make your home safer and prevent falls. OUTDOORS  Repair cracks and edges of walkways and driveways.  Remove high doorway thresholds.  Trim shrubbery on the main path into your home.  Have good outside lighting.  Clear walkways of tools, rocks, debris, and clutter.  Check that handrails are not broken and are securely fastened. Both sides of steps should have handrails.  Have leaves, snow, and ice cleared regularly.  Use sand or salt on walkways during winter months.  In the garage, clean up grease or oil spills. BATHROOM  Install night lights.  Install grab bars by the toilet and in the tub and shower.  Use non-skid mats or decals in the tub or shower.  Place a plastic non-slip stool in the shower to sit on, if needed.  Keep floors dry and clean up all water on the floor immediately.  Remove soap buildup in the tub or shower on a regular basis.  Secure bath mats with non-slip, double-sided rug tape.  Remove throw rugs and tripping hazards from the floors. BEDROOMS  Install night lights.  Make sure a bedside light is easy to reach.  Do not use oversized bedding.  Keep a telephone by your bedside.  Have a firm chair with side arms to use for getting dressed.  Remove throw rugs and tripping hazards from the floor. KITCHEN  Keep handles on pots and pans turned toward the center of the stove. Use back burners when possible.  Clean up spills quickly and allow time for drying.  Avoid walking on wet floors.  Avoid hot utensils and knives.  Position shelves so they are not too high or low.  Place commonly used objects within easy reach.  If necessary, use a sturdy step  stool with a grab bar when reaching.  Keep electrical cables out of the way.  Do not use floor polish or wax that makes floors slippery. If you must use wax, use non-skid floor wax.  Remove throw rugs and tripping hazards from the floor. STAIRWAYS  Never leave objects on stairs.  Place handrails on both sides of stairways and use them. Fix any loose handrails. Make sure handrails on both sides of the stairways are as long as the stairs.  Check carpeting to make sure it is firmly attached along stairs. Make repairs to worn or loose carpet promptly.  Avoid placing throw rugs at the top or bottom of stairways, or properly secure the rug with carpet tape to prevent slippage. Get rid of throw rugs, if possible.  Have an electrician put in a light switch at the top and bottom of the stairs. OTHER FALL PREVENTION TIPS  Wear low-heel or rubber-soled shoes that are supportive and fit well. Wear closed toe shoes.  When using a stepladder, make sure it is fully opened and both spreaders are firmly locked. Do not climb a closed stepladder.  Add color or contrast paint or tape to grab bars and handrails in your home. Place contrasting color strips on first and last steps.  Learn and use mobility aids as needed. Install an electrical emergency response system.  Turn on lights to avoid dark areas. Replace light bulbs that burn  out immediately. Get light switches that glow.  Arrange furniture to create clear pathways. Keep furniture in the same place.  Firmly attach carpet with non-skid or double-sided tape.  Eliminate uneven floor surfaces.  Select a carpet pattern that does not visually hide the edge of steps.  Be aware of all pets. OTHER HOME SAFETY TIPS  Set the water temperature for 120 F (48.8 C).  Keep emergency numbers on or near the telephone.  Keep smoke detectors on every level of the home and near sleeping areas. Document Released: 09/17/2002 Document Revised: 03/28/2012  Document Reviewed: 12/17/2011 [facility] Patient Information 2015 [place], [place]. This information is not intended to replace advice given to you by your health care provider. Make sure you discuss any questions you have with your health care provider.   Preventive Care for Adults Ages 109 and over  Blood pressure check.** / Every 1 to 2 years.  Lipid and cholesterol check.**/ Every 5 years beginning at age 94.  Lung cancer screening. / Every year if you are aged 33-80 years and have a 30-pack-year history of smoking and currently smoke or have quit within the past 15 years. Yearly screening is stopped once you have quit smoking for at least 15 years or develop a health problem that would prevent you from having lung cancer treatment.  Fecal occult blood test (FOBT) of stool. / Every year beginning at age 103 and continuing until age 70. You may not have to do this test if you get a colonoscopy every 10 years.  Flexible sigmoidoscopy** or colonoscopy.** / Every 5 years for a flexible sigmoidoscopy or every 10 years for a colonoscopy beginning at age 79 and continuing until age 70.  Hepatitis C blood test.** / For all people born from [birth year] through 1965 and any individual with known risks for hepatitis C.  Abdominal aortic aneurysm (AAA) screening.** / A one-time screening for ages 60 to 29 years who are current or former smokers.  Skin self-exam. / Monthly.  Influenza vaccine. / Every year.  Tetanus, diphtheria, and acellular pertussis (Tdap/Td) vaccine.** / 1 dose of Td every 10 years.  Varicella vaccine.** / Consult your health care provider.  Zoster vaccine.** / 1 dose for adults aged 25 years or older.  Pneumococcal 13-valent conjugate (PCV13) vaccine.** / Consult your health care provider.  Pneumococcal polysaccharide (PPSV23) vaccine.** / 1 dose for all adults aged 60 years and older.  Meningococcal vaccine.** / Consult your health care provider.  Hepatitis A vaccine.** /  Consult your health care provider.  Hepatitis B vaccine.** / Consult your health care provider.  Haemophilus influenzae type b (Hib) vaccine.** / Consult your health care provider. **Family history and personal history of risk and conditions may change your health care provider's recommendations. Document Released: 11/23/2001 Document Revised: 10/02/2013 Document Reviewed: 02/22/2011 [facility] Patient Information 2015 [place], [place]. This information is not intended to replace advice given to you by your health care provider. Make sure you discuss any questions you have with your health care provider.

## 2016-08-27 MED ORDER — ATORVASTATIN CALCIUM 40 MG PO TABS: 40.0000 mg | ORAL_TABLET | Freq: Every day | ORAL | 1 refills | Status: DC

## 2016-08-27 NOTE — Addendum Note (Signed)
Addended byConrad  D on: 08/27/2016 03:50 PM   Modules accepted: Orders

## 2016-08-27 NOTE — Assessment & Plan Note (Signed)
DM: Last A1c 5.7, glyburide d/c, continue with Januvia. Checking an A1c. HTN: Seems control, continue with losartan, Lasix, carvedilol, amlodipine. Check a BMP High cholesterol: Based on last FLP, Advicor was d/c, he is now on Lipitor. Check a FLP, AST, ALT A. fib, pacemaker: Saw cardiology 05-2016, note reviewed, stable. Fracture ,finger, saw orthopedic surgery, doing well. BPH: On Flomax, sx controlled. RTC 5-6 months

## 2016-09-03 LAB — CUP PACEART REMOTE DEVICE CHECK
Battery Remaining Longevity: 66 mo
Battery Voltage: 3 V
Brady Statistic AP VP Percent: 0 %
Brady Statistic AP VS Percent: 0 %
Brady Statistic AS VP Percent: 97.72 %
Brady Statistic AS VS Percent: 2.28 %
Brady Statistic RA Percent Paced: 0 %
Brady Statistic RV Percent Paced: 97.72 %
Date Time Interrogation Session: 20171113195306
Implantable Lead Implant Date: 20121128
Implantable Lead Implant Date: 20121128
Implantable Lead Location: 753858
Implantable Lead Location: 753860
Implantable Lead Model: 419488
Implantable Lead Model: 5076
Implantable Pulse Generator Implant Date: 20121128
Lead Channel Impedance Value: 323 Ohm
Lead Channel Impedance Value: 4047 Ohm
Lead Channel Impedance Value: 4047 Ohm
Lead Channel Impedance Value: 456 Ohm
Lead Channel Impedance Value: 456 Ohm
Lead Channel Impedance Value: 494 Ohm
Lead Channel Impedance Value: 570 Ohm
Lead Channel Impedance Value: 608 Ohm
Lead Channel Impedance Value: 627 Ohm
Lead Channel Pacing Threshold Amplitude: 0.75 V
Lead Channel Pacing Threshold Amplitude: 1 V
Lead Channel Pacing Threshold Pulse Width: 0.4 ms
Lead Channel Pacing Threshold Pulse Width: 0.4 ms
Lead Channel Sensing Intrinsic Amplitude: 5.125 mV
Lead Channel Sensing Intrinsic Amplitude: 5.125 mV
Lead Channel Setting Pacing Amplitude: 2 V
Lead Channel Setting Pacing Amplitude: 2 V
Lead Channel Setting Pacing Pulse Width: 0.4 ms
Lead Channel Setting Pacing Pulse Width: 0.4 ms
Lead Channel Setting Sensing Sensitivity: 4 mV

## 2016-09-24 ENCOUNTER — Other Ambulatory Visit: Payer: Self-pay | Admitting: Internal Medicine

## 2016-11-23 ENCOUNTER — Telehealth: Payer: Self-pay | Admitting: Internal Medicine

## 2016-11-23 ENCOUNTER — Ambulatory Visit (INDEPENDENT_AMBULATORY_CARE_PROVIDER_SITE_OTHER): Payer: Medicare Other | Admitting: *Deleted

## 2016-11-23 DIAGNOSIS — I495 Sick sinus syndrome: Secondary | ICD-10-CM

## 2016-11-23 NOTE — Telephone Encounter (Signed)
Spoke w/ pt and informed him that we did receive his remote transmission. Pt verbalized understanding.  

## 2016-11-23 NOTE — Telephone Encounter (Signed)
New message  Pt call to see if transmission was received. Please all back to discuss.

## 2016-11-23 NOTE — Progress Notes (Signed)
Remote pacemaker transmission.   

## 2016-11-24 ENCOUNTER — Encounter: Payer: Self-pay | Admitting: Cardiology

## 2016-11-25 LAB — CUP PACEART REMOTE DEVICE CHECK
Battery Remaining Longevity: 57 mo
Battery Voltage: 2.99 V
Brady Statistic AP VP Percent: 0 %
Brady Statistic AP VS Percent: 0 %
Brady Statistic AS VP Percent: 97.45 %
Brady Statistic AS VS Percent: 2.55 %
Brady Statistic RA Percent Paced: 0 %
Brady Statistic RV Percent Paced: 98.52 %
Date Time Interrogation Session: 20180213201118
Implantable Lead Implant Date: 20121128
Implantable Lead Implant Date: 20121128
Implantable Lead Location: 753858
Implantable Lead Location: 753860
Implantable Lead Model: 4194
Implantable Lead Model: 5076
Implantable Pulse Generator Implant Date: 20121128
Lead Channel Impedance Value: 323 Ohm
Lead Channel Impedance Value: 399 Ohm
Lead Channel Impedance Value: 4047 Ohm
Lead Channel Impedance Value: 4047 Ohm
Lead Channel Impedance Value: 437 Ohm
Lead Channel Impedance Value: 494 Ohm
Lead Channel Impedance Value: 551 Ohm
Lead Channel Impedance Value: 551 Ohm
Lead Channel Impedance Value: 589 Ohm
Lead Channel Pacing Threshold Amplitude: 0.75 V
Lead Channel Pacing Threshold Amplitude: 0.875 V
Lead Channel Pacing Threshold Pulse Width: 0.4 ms
Lead Channel Pacing Threshold Pulse Width: 0.4 ms
Lead Channel Sensing Intrinsic Amplitude: 5.125 mV
Lead Channel Sensing Intrinsic Amplitude: 5.125 mV
Lead Channel Setting Pacing Amplitude: 2 V
Lead Channel Setting Pacing Amplitude: 2.25 V
Lead Channel Setting Pacing Pulse Width: 0.4 ms
Lead Channel Setting Pacing Pulse Width: 0.4 ms
Lead Channel Setting Sensing Sensitivity: 4 mV

## 2016-12-06 ENCOUNTER — Telehealth: Payer: Self-pay | Admitting: Internal Medicine

## 2016-12-06 NOTE — Telephone Encounter (Signed)
Caller name: Tresa Endo from [place]  Call back number: [phone] ext [phone]    Reason for call:  Re faxing form request for certificate of necessity and chart notes for oxygen equipment, form faxed to [phone] Attention [facility]

## 2016-12-06 NOTE — Telephone Encounter (Signed)
Have you seen this form?

## 2016-12-07 ENCOUNTER — Other Ambulatory Visit: Payer: Self-pay | Admitting: Internal Medicine

## 2016-12-07 NOTE — Telephone Encounter (Signed)
Form received from Angie-Pt needing new replacement oxygen equipment (uses nocturnally and w/ exertion PRN). Paperwork last completed by [name] in 2014. Will complete and forward to PCP for signing.

## 2016-12-07 NOTE — Telephone Encounter (Signed)
Received corrected request for Certificate of Necessity and chart notes for oxygen equipment from [facility].  All forms given to Kaylyn.//AB/CMA

## 2016-12-08 ENCOUNTER — Encounter: Payer: Self-pay | Admitting: Cardiology

## 2016-12-09 NOTE — Telephone Encounter (Signed)
Received fax confirmation on 12/09/16 at 0810.

## 2016-12-09 NOTE — Telephone Encounter (Signed)
Completed forms faxed to [phone]. Forms sent for scanning.

## 2016-12-09 NOTE — Telephone Encounter (Signed)
[facility] needs chart note faxed to [phone] ASAP

## 2016-12-09 NOTE — Telephone Encounter (Signed)
OV note from 08/26/2016 printed and faxed to [facility] at number below.

## 2016-12-16 NOTE — Telephone Encounter (Signed)
Received call from Dawson at [facility]-didn't receive OV notes. OV note printed and re-faxed to [phone] ATTN: Tresa Endo.

## 2016-12-16 NOTE — Telephone Encounter (Signed)
Received fax confirmation on 12/16/2016 at 1004.

## 2016-12-21 ENCOUNTER — Other Ambulatory Visit: Payer: Self-pay | Admitting: Internal Medicine

## 2016-12-27 ENCOUNTER — Telehealth: Payer: Self-pay | Admitting: Internal Medicine

## 2016-12-27 NOTE — Telephone Encounter (Signed)
Caller name: Relationship to patient:Self Can be reached: [phone] Pharmacy:  [facility] - [place], [place] - [address] [phone] (Phone) [phone] (Fax)     Reason for call: Request a call back to discuss Ambien. States he needs a 60 day supply instead of 45 days

## 2016-12-28 NOTE — Telephone Encounter (Signed)
Spoke w/ Ronita at [facility]- zolpidem (Ambien) last refilled on 11/19/2016 for 90 days, next refill will not be available until 01/25/2017. Spoke w/ Pt, informed him of information from [facility], instructed to check that he received supply that was sent on 11/19/2016 if not to inform [facility]. Pt verbalized understanding.

## 2016-12-28 NOTE — Telephone Encounter (Signed)
LMOM informing Pt that sig for Ambien is 1 tab by mouth daily at bedtime as needed for sleep and was refilled for #90 tabs and 1RF to [facility] on 07/09/2016 which would be a 90 day supply unless taking medication differently. Instructed Pt to return call to discuss.

## 2016-12-28 NOTE — Telephone Encounter (Signed)
Spoke w/ Pt, he is requesting I call [facility] to "see whats going on." Informed I am in clinic w/ PCP but would call when I get several minutes. Pt verbalized understanding.

## 2016-12-29 ENCOUNTER — Telehealth: Payer: Self-pay

## 2016-12-29 MED ORDER — ZOLPIDEM TARTRATE 10 MG PO TABS: 10.0000 mg | ORAL_TABLET | Freq: Every evening | ORAL | 1 refills | Status: DC | PRN

## 2016-12-29 NOTE — Telephone Encounter (Signed)
Spoke w/ Pt, he called to inform that [facility] will only fill 45 tablets-last script was written for 90 tabs. Informed Pt that it maybe an insurance issue but would call [facility] again. Called [facility] and spoke w/ Judy-was informed that Pt's insurance coverage would not cover more than 45 tabs at one time w/o PA. Requested pharmacy benefits ID, BIN and Rx group numbers as we do not have on file, informed by Darel Hong that prescription from 07/09/2016 was cancelled by Pt and would need a new Rx faxed to them before PA could be initiated.

## 2016-12-29 NOTE — Telephone Encounter (Signed)
Ambien Rx printed, signed and faxed to [facility]. Will await PA information from [facility].

## 2016-12-29 NOTE — Telephone Encounter (Signed)
Spoke w/ Pt, informed him that insurance is not covering more than 45 tabs in 90 day period. Can request PA quantity over-ride, waiting on forms from [facility]. If not received by lunchtime tomorrow will call [facility] again. Pt verbalized understanding.

## 2016-12-31 NOTE — Telephone Encounter (Signed)
Late entry: PA initiated via Covermymeds for [employer], received fax that [employer] does not have active Pt information.

## 2016-12-31 NOTE — Telephone Encounter (Signed)
PA form from [facility] printed offline, completed and faxed to [phone]. Form sent for scanning, awaiting determination.

## 2017-01-04 NOTE — Telephone Encounter (Signed)
Received PA approval notification. Medication approved through 07/02/2017. LMOM informing Pt of PA approval, instructed to let us know if he is not receiving 90 tabs going forward. Also informed to call if questions/concerns.

## 2017-01-16 ENCOUNTER — Other Ambulatory Visit: Payer: Self-pay | Admitting: Internal Medicine

## 2017-01-25 ENCOUNTER — Ambulatory Visit: Payer: Medicare Other | Admitting: Internal Medicine

## 2017-01-31 ENCOUNTER — Ambulatory Visit: Payer: Medicare Other | Admitting: Internal Medicine

## 2017-02-02 ENCOUNTER — Encounter: Payer: Self-pay | Admitting: Internal Medicine

## 2017-02-02 ENCOUNTER — Ambulatory Visit (INDEPENDENT_AMBULATORY_CARE_PROVIDER_SITE_OTHER): Payer: Medicare Other | Admitting: Internal Medicine

## 2017-02-02 VITALS — BP 128/70 | HR 82 | Temp 98.0°F | Resp 16 | Ht 69.0 in | Wt 197.1 lb

## 2017-02-02 DIAGNOSIS — E785 Hyperlipidemia, unspecified: Secondary | ICD-10-CM | POA: Diagnosis not present

## 2017-02-02 DIAGNOSIS — I10 Essential (primary) hypertension: Secondary | ICD-10-CM

## 2017-02-02 DIAGNOSIS — E1159 Type 2 diabetes mellitus with other circulatory complications: Secondary | ICD-10-CM

## 2017-02-02 NOTE — Progress Notes (Signed)
Subjective:    Patient ID: Bradley Sit., male    DOB: 08/29/1941, 76 y.o.   MRN: [id number]  DOS:  02/02/2017 Type of visit - description : rov Interval history: Since the last office visit he is doing well. Good medication compliance. Ambulatory blood sugars ~ 120 when check Ambulatory BPs change from day-to-day but they are all within range according to the patient.   Review of Systems  Uses oxygen appropriately, portable oxygen only for long excursions like going shopping. Denies any persistent cough or abundant sputum production  Past Medical History:  Diagnosis Date  . Abnormal CT scan, chest 2012   CT chest, several lymphadenopathies. Sees pulmonary  . Anemia    intermittent  . Arthritis    "?back" (08/22/2014)  . Atrial fibrillation (HCC)    s/p AV node ablation & BiV PPM implantation 09/08/11 (op dictation pending)  . BPH (benign prostatic hyperplasia)    Saw Dr [name] 2004, normal renal u/s  . CAD (coronary artery disease)   . CHF (congestive heart failure) (HCC)    Thought primarily to be non-systolic although EF down (EF 23-30% 12/2010, down to 35-40% 09/05/11), cath 2008 with no CAD, nuclear study 07/2011 showing Small area of reversibility in the distal ant/lat wall the left ventricle suspicious for ischemia/septal wall HK but felt to be low risk  (per D/C Summary 07/2011)  . Chronic bronchitis (HCC)    "get it ~ q yr"  . Heart murmur   . HLD (hyperlipidemia)   . HTN (hypertension)   . 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 Insomnia   . Migraines    "very very rare"  . On home oxygen therapy    at night as needed  . Pacemaker   . Peripheral vascular disease (HCC)    ??  . Pleural effusion 2008   S/p decortication  . Pulmonary HTN (HCC)    per cath 2008  . Type II diabetes mellitus (HCC) 1999    Past Surgical History:  Procedure Laterality Date  . BI-VENTRICULAR PACEMAKER INSERTION Left 09/08/2011   Procedure:  BI-VENTRICULAR PACEMAKER INSERTION (CRT-P);  Surgeon: Marinus Maw, MD;  Location: [facility] CATH LAB;  Service: Cardiovascular;  Laterality: Left;  . CARDIAC CATHETERIZATION     "couple times; never had balloon or stent" (09/27/2012)  . COLONOSCOPY  03/10/11   normal  . INSERT / REPLACE / REMOVE PACEMAKER  09/08/11   pacemaker placement  . LUNG DECORTICATION    . PLEURAL SCARIFICATION    . pneumothorax with fibrothorax  ~ 2010  . TONSILLECTOMY     "as a kid" (09/27/2012)    Social History   Social History  . Marital status: Married    Spouse name: [name]  . Number of children: 1  . Years of education: N/A   Occupational History  . 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 Smoking status: Former Smoker    Packs/day: 2.50    Years: 25.00    Types: Cigarettes    Quit date: 10/11/1980  . Smokeless tobacco: Never Used     Comment: used to smoke 2.5 ppd for 25 years  . Alcohol use No     Comment: 08/22/2014 "maybe q couple months I'll have a beer w/dinner"  . Drug use: No  . Sexual activity: Not Currently   Other Topics Concern  . Not on file   Social History Narrative  Moved from [place] 2003.[place]   He lives [place]/ his wife, [name]   1 son, substance abuse, passed away 2016/10/11               Allergies as of 02/02/2017      Reactions   Hydrocodone Other (See Comments)   "given to him in the hospital; went thru withdrawals once home; dr said not to take it again" (09/27/2012)   Tramadol Other (See Comments)   "given to him in the hospital; went thru withdrawals once home; dr said not to take it again" (09/27/2012)   Tadalafil Other (See Comments)   headache, backache   Avelox [moxifloxacin Hydrochloride] Itching, Other (See Comments)   Headache      Medication List       Accurate as of 02/02/17  8:58 PM. Always use your most recent med list.          albuterol 108 (90 Base) MCG/ACT inhaler Commonly known as:  PROVENTIL HFA;VENTOLIN HFA Inhale  2 puffs into the lungs every 4 (four) hours as needed for wheezing or shortness of breath.   amLODipine 10 MG tablet Commonly known as:  NORVASC Take 1 tablet (10 mg total) by mouth daily.   aspirin 81 MG tablet Take 81 mg by mouth every morning.   atorvastatin 40 MG tablet Commonly known as:  LIPITOR Take 1 tablet (40 mg total) by mouth daily.   calcium-vitamin D 500-200 MG-UNIT tablet Commonly known as:  OSCAL WITH D Take 1 tablet by mouth every morning.   carvedilol 25 MG tablet Commonly known as:  COREG Take 1.5 tablets (37.5 mg total) by mouth 2 (two) times daily with a meal.   dextromethorphan-guaiFENesin 30-600 MG 12hr tablet Commonly known as:  MUCINEX DM Take 1 tablet by mouth 2 (two) times daily as needed for cough.   furosemide 40 MG tablet Commonly known as:  LASIX Take 1 1/2 tablets by mouth twice daily and as needed   losartan 100 MG tablet Commonly known as:  COZAAR Take 1 tablet (100 mg total) by mouth daily.   multivitamins ther. w/minerals Tabs tablet Take 1 tablet by mouth daily.   nitroGLYCERIN 0.4 MG SL tablet Commonly known as:  NITROSTAT DISSOLVE 1 TABLET UNDER THE TONGUE EVERY 5 MINUTES AS NEEDED FOR CHEST PAIN   ONE TOUCH ULTRA TEST test strip Generic drug:  glucose blood USE TO CHECK BLOOD SUGAR NO MORE THAN TWICE A DAY   pantoprazole 40 MG tablet Commonly known as:  PROTONIX TAKE 1 TABLET EVERY MORNING   sitaGLIPtin 100 MG tablet Commonly known as:  JANUVIA Take 1 tablet (100 mg total) by mouth daily.   tamsulosin 0.4 MG Caps capsule Commonly known as:  FLOMAX Take 1 capsule (0.4 mg total) by mouth daily.   TYLENOL PO Take 1 tablet by mouth 2 (two) times daily as needed (pain).   zolpidem 10 MG tablet Commonly known as:  AMBIEN Take 1 tablet (10 mg total) by mouth at bedtime as needed for sleep.          Objective:   Physical Exam BP 128/70 (BP Location: Left Arm, Patient Position: Sitting, Cuff Size: Normal)   Pulse 82    Temp 98 F (36.7 C) (Oral)   Resp 16   Ht 5\' 9"  (1.753 m)   Wt 197 lb 2 oz (89.4 kg)   SpO2 92%   BMI 29.11 kg/m  General:   Well developed, well nourished . NAD.  HEENT:  Normocephalic .  Face symmetric, atraumatic Lungs:  Increase her sounds but clear Normal respiratory effort, no intercostal retractions, no accessory muscle use. Heart: RRR,  no murmur.  No pretibial edema bilaterally  Skin: Not pale. Not jaundice Neurologic:  alert & oriented X3.  Speech normal, gait appropriate for age and unassisted Psych--  Cognition and judgment appear intact.  Cooperative with normal attention span and concentration.  Behavior appropriate. No anxious or depressed appearing.       Assessment & Plan:   Assessment  DM HTN Hyperlipidemia Anxiety- insomnia  BPH Cardiovascular:Dr Ladona Ridgel  --CAD -- CHF --Atrial fibrillation;  --Pacemaker --h/o IC bleeding: Not anticoagulated --Peripheral vascular disease? Pulmonary: Dr [name], last OV 10-18-2014, f/u prn --COPD, severe, chronic resp failure, night O2 prn, has portable O2  --Pleural effusion s/p decortication --Pulmonary hypertension  Stasis dermatitis   PLAN: DM: Currently only on Januvia, last A1c 5.9. Recheck a A1c HTN: Well-controlled on amlodipine, carvedilol, Lasix, losartan. Check a BMP High cholesterol: Base on last FLP, Lipitor dose increased, check a FLP, AST, ALT. Other issues seem stable. RTC 5-6 months, routine checkup

## 2017-02-02 NOTE — Patient Instructions (Addendum)
  GO TO THE FRONT DESK Schedule your next appointment for a  Routine check in 5 to 6 months   Schedule labs to be done within the next few days, fasting

## 2017-02-02 NOTE — Progress Notes (Signed)
Pre visit review using our clinic review tool, if applicable. No additional management support is needed unless otherwise documented below in the visit note. 

## 2017-02-02 NOTE — Assessment & Plan Note (Signed)
DM: Currently only on Januvia, last A1c 5.9. Recheck a A1c HTN: Well-controlled on amlodipine, carvedilol, Lasix, losartan. Check a BMP High cholesterol: Base on last FLP, Lipitor dose increased, check a FLP, AST, ALT. Other issues seem stable. RTC 5-6 months, routine checkup

## 2017-02-03 ENCOUNTER — Other Ambulatory Visit (INDEPENDENT_AMBULATORY_CARE_PROVIDER_SITE_OTHER): Payer: Medicare Other

## 2017-02-03 DIAGNOSIS — E785 Hyperlipidemia, unspecified: Secondary | ICD-10-CM | POA: Diagnosis not present

## 2017-02-03 DIAGNOSIS — I10 Essential (primary) hypertension: Secondary | ICD-10-CM | POA: Diagnosis not present

## 2017-02-03 DIAGNOSIS — E1159 Type 2 diabetes mellitus with other circulatory complications: Secondary | ICD-10-CM

## 2017-02-03 LAB — BASIC METABOLIC PANEL
BUN: 12 mg/dL (ref 6–23)
CO2: 32 mEq/L (ref 19–32)
Calcium: 9.2 mg/dL (ref 8.4–10.5)
Chloride: 103 mEq/L (ref 96–112)
Creatinine, Ser: 0.88 mg/dL (ref 0.40–1.50)
GFR: 89.49 mL/min (ref >60.00)
Glucose, Bld: 127 mg/dL — ABNORMAL HIGH (ref 70–99)
Potassium: 4 mEq/L (ref 3.5–5.1)
Sodium: 140 mEq/L (ref 135–145)

## 2017-02-03 LAB — LIPID PANEL
Cholesterol: 105 mg/dL (ref 0–200)
HDL: 38.3 mg/dL — ABNORMAL LOW (ref >39.00)
LDL Cholesterol: 53 mg/dL (ref 0–99)
NonHDL: 67.1
Total CHOL/HDL Ratio: 3
Triglycerides: 71 mg/dL (ref 0.0–149.0)
VLDL: 14.2 mg/dL (ref 0.0–40.0)

## 2017-02-03 LAB — ALT: ALT: 12 U/L (ref 0–53)

## 2017-02-03 LAB — HEMOGLOBIN A1C: Hgb A1c MFr Bld: 6.3 % (ref 4.6–6.5)

## 2017-02-03 LAB — AST: AST: 13 U/L (ref 0–37)

## 2017-02-08 ENCOUNTER — Other Ambulatory Visit: Payer: Self-pay | Admitting: Internal Medicine

## 2017-02-21 ENCOUNTER — Encounter (HOSPITAL_BASED_OUTPATIENT_CLINIC_OR_DEPARTMENT_OTHER): Payer: Self-pay | Admitting: *Deleted

## 2017-02-21 ENCOUNTER — Emergency Department (HOSPITAL_BASED_OUTPATIENT_CLINIC_OR_DEPARTMENT_OTHER): Payer: Medicare Other

## 2017-02-21 ENCOUNTER — Inpatient Hospital Stay (HOSPITAL_BASED_OUTPATIENT_CLINIC_OR_DEPARTMENT_OTHER)
Admission: EM | Admit: 2017-02-21 | Discharge: 2017-02-24 | DRG: 291 | Disposition: A | Discharge status: Home / Self Care | Payer: Medicare Other | Attending: Family Medicine | Admitting: Family Medicine

## 2017-02-21 DIAGNOSIS — J9621 Acute and chronic respiratory failure with hypoxia: Secondary | ICD-10-CM | POA: Diagnosis present

## 2017-02-21 DIAGNOSIS — Z87891 Personal history of nicotine dependence: Secondary | ICD-10-CM | POA: Diagnosis not present

## 2017-02-21 DIAGNOSIS — I5043 Acute on chronic combined systolic (congestive) and diastolic (congestive) heart failure: Secondary | ICD-10-CM | POA: Diagnosis not present

## 2017-02-21 DIAGNOSIS — I2510 Atherosclerotic heart disease of native coronary artery without angina pectoris: Secondary | ICD-10-CM | POA: Diagnosis present

## 2017-02-21 DIAGNOSIS — I110 Hypertensive heart disease with heart failure: Principal | ICD-10-CM | POA: Diagnosis present

## 2017-02-21 DIAGNOSIS — Z79899 Other long term (current) drug therapy: Secondary | ICD-10-CM

## 2017-02-21 DIAGNOSIS — Z7982 Long term (current) use of aspirin: Secondary | ICD-10-CM

## 2017-02-21 DIAGNOSIS — I5033 Acute on chronic diastolic (congestive) heart failure: Secondary | ICD-10-CM | POA: Diagnosis not present

## 2017-02-21 DIAGNOSIS — R0602 Shortness of breath: Secondary | ICD-10-CM | POA: Diagnosis not present

## 2017-02-21 DIAGNOSIS — Z66 Do not resuscitate: Secondary | ICD-10-CM | POA: Diagnosis present

## 2017-02-21 DIAGNOSIS — I480 Paroxysmal atrial fibrillation: Secondary | ICD-10-CM | POA: Diagnosis present

## 2017-02-21 DIAGNOSIS — Z881 Allergy status to other antibiotic agents status: Secondary | ICD-10-CM | POA: Diagnosis not present

## 2017-02-21 DIAGNOSIS — Z885 Allergy status to narcotic agent status: Secondary | ICD-10-CM | POA: Diagnosis not present

## 2017-02-21 DIAGNOSIS — T380X5A Adverse effect of glucocorticoids and synthetic analogues, initial encounter: Secondary | ICD-10-CM | POA: Diagnosis present

## 2017-02-21 DIAGNOSIS — I272 Pulmonary hypertension, unspecified: Secondary | ICD-10-CM | POA: Diagnosis present

## 2017-02-21 DIAGNOSIS — E119 Type 2 diabetes mellitus without complications: Secondary | ICD-10-CM | POA: Diagnosis not present

## 2017-02-21 DIAGNOSIS — Z888 Allergy status to other drugs, medicaments and biological substances status: Secondary | ICD-10-CM | POA: Diagnosis not present

## 2017-02-21 DIAGNOSIS — E1159 Type 2 diabetes mellitus with other circulatory complications: Secondary | ICD-10-CM | POA: Diagnosis not present

## 2017-02-21 DIAGNOSIS — I509 Heart failure, unspecified: Secondary | ICD-10-CM

## 2017-02-21 DIAGNOSIS — E785 Hyperlipidemia, unspecified: Secondary | ICD-10-CM | POA: Diagnosis present

## 2017-02-21 DIAGNOSIS — Z950 Presence of cardiac pacemaker: Secondary | ICD-10-CM | POA: Diagnosis not present

## 2017-02-21 DIAGNOSIS — N400 Enlarged prostate without lower urinary tract symptoms: Secondary | ICD-10-CM | POA: Diagnosis present

## 2017-02-21 DIAGNOSIS — E1165 Type 2 diabetes mellitus with hyperglycemia: Secondary | ICD-10-CM | POA: Diagnosis present

## 2017-02-21 DIAGNOSIS — I482 Chronic atrial fibrillation: Secondary | ICD-10-CM | POA: Diagnosis not present

## 2017-02-21 DIAGNOSIS — I495 Sick sinus syndrome: Secondary | ICD-10-CM | POA: Diagnosis not present

## 2017-02-21 DIAGNOSIS — J441 Chronic obstructive pulmonary disease with (acute) exacerbation: Secondary | ICD-10-CM | POA: Diagnosis not present

## 2017-02-21 DIAGNOSIS — I5030 Unspecified diastolic (congestive) heart failure: Secondary | ICD-10-CM | POA: Diagnosis not present

## 2017-02-21 DIAGNOSIS — Z9981 Dependence on supplemental oxygen: Secondary | ICD-10-CM | POA: Diagnosis not present

## 2017-02-21 DIAGNOSIS — I5031 Acute diastolic (congestive) heart failure: Secondary | ICD-10-CM | POA: Diagnosis not present

## 2017-02-21 DIAGNOSIS — I340 Nonrheumatic mitral (valve) insufficiency: Secondary | ICD-10-CM | POA: Diagnosis not present

## 2017-02-21 DIAGNOSIS — I739 Peripheral vascular disease, unspecified: Secondary | ICD-10-CM | POA: Diagnosis not present

## 2017-02-21 LAB — GLUCOSE, CAPILLARY
Glucose-Capillary: 113 mg/dL — ABNORMAL HIGH (ref 65–99)
Glucose-Capillary: 184 mg/dL — ABNORMAL HIGH (ref 65–99)

## 2017-02-21 LAB — COMPREHENSIVE METABOLIC PANEL
ALT: 17 U/L (ref 17–63)
AST: 19 U/L (ref 15–41)
Albumin: 3.6 g/dL (ref 3.5–5.0)
Alkaline Phosphatase: 82 U/L (ref 38–126)
Anion gap: 6 (ref 5–15)
BUN: 12 mg/dL (ref 6–20)
CO2: 32 mmol/L (ref 22–32)
Calcium: 8.6 mg/dL — ABNORMAL LOW (ref 8.9–10.3)
Chloride: 102 mmol/L (ref 101–111)
Creatinine, Ser: 0.8 mg/dL (ref 0.61–1.24)
GFR calc Af Amer: >60 mL/min (ref >60)
GFR calc non Af Amer: >60 mL/min (ref >60)
Glucose, Bld: 154 mg/dL — ABNORMAL HIGH (ref 65–99)
Potassium: 3.8 mmol/L (ref 3.5–5.1)
Sodium: 140 mmol/L (ref 135–145)
Total Bilirubin: 0.8 mg/dL (ref 0.3–1.2)
Total Protein: 6.7 g/dL (ref 6.5–8.1)

## 2017-02-21 LAB — CBC WITH DIFFERENTIAL/PLATELET
Basophils Absolute: 0 10*3/uL (ref 0.0–0.1)
Basophils Relative: 0 %
Eosinophils Absolute: 0.1 10*3/uL (ref 0.0–0.7)
Eosinophils Relative: 3 %
HCT: 41.3 % (ref 39.0–52.0)
Hemoglobin: 14.2 g/dL (ref 13.0–17.0)
Lymphocytes Relative: 21 %
Lymphs Abs: 1.1 10*3/uL (ref 0.7–4.0)
MCH: 30.9 pg (ref 26.0–34.0)
MCHC: 34.4 g/dL (ref 30.0–36.0)
MCV: 89.8 fL (ref 78.0–100.0)
Monocytes Absolute: 0.5 10*3/uL (ref 0.1–1.0)
Monocytes Relative: 9 %
Neutro Abs: 3.6 10*3/uL (ref 1.7–7.7)
Neutrophils Relative %: 67 %
Platelets: 189 10*3/uL (ref 150–400)
RBC: 4.6 MIL/uL (ref 4.22–5.81)
RDW: 13.7 % (ref 11.5–15.5)
WBC: 5.3 10*3/uL (ref 4.0–10.5)

## 2017-02-21 LAB — BRAIN NATRIURETIC PEPTIDE: B Natriuretic Peptide: 461.3 pg/mL — ABNORMAL HIGH (ref 0.0–100.0)

## 2017-02-21 LAB — TROPONIN I: Troponin I: <0.03 ng/mL (ref <0.03)

## 2017-02-21 LAB — LIPASE, BLOOD: Lipase: 23 U/L (ref 11–51)

## 2017-02-21 MED ORDER — LOSARTAN POTASSIUM 50 MG PO TABS
100.0000 mg | ORAL_TABLET | Freq: Every day | ORAL | Status: DC
  Administered 2017-02-22 – 2017-02-24 (×3): 100 mg via ORAL
  Filled 2017-02-21 (×3): qty 2

## 2017-02-21 MED ORDER — AMLODIPINE BESYLATE 10 MG PO TABS
10.0000 mg | ORAL_TABLET | Freq: Every day | ORAL | Status: DC
  Administered 2017-02-22 – 2017-02-24 (×3): 10 mg via ORAL
  Filled 2017-02-21 (×3): qty 1

## 2017-02-21 MED ORDER — GUAIFENESIN ER 600 MG PO TB12
600.0000 mg | ORAL_TABLET | Freq: Two times a day (BID) | ORAL | Status: DC
  Administered 2017-02-21 – 2017-02-24 (×6): 600 mg via ORAL
  Filled 2017-02-21 (×6): qty 1

## 2017-02-21 MED ORDER — PANTOPRAZOLE SODIUM 40 MG PO TBEC
40.0000 mg | DELAYED_RELEASE_TABLET | Freq: Every morning | ORAL | Status: DC
  Administered 2017-02-22 – 2017-02-24 (×3): 40 mg via ORAL
  Filled 2017-02-21 (×3): qty 1

## 2017-02-21 MED ORDER — FUROSEMIDE 10 MG/ML IJ SOLN
60.0000 mg | Freq: Two times a day (BID) | INTRAMUSCULAR | Status: DC
  Administered 2017-02-21 – 2017-02-23 (×4): 60 mg via INTRAVENOUS
  Filled 2017-02-21 (×4): qty 6

## 2017-02-21 MED ORDER — POTASSIUM CHLORIDE CRYS ER 20 MEQ PO TBCR
20.0000 meq | EXTENDED_RELEASE_TABLET | Freq: Once | ORAL | Status: AC
  Administered 2017-02-21: 20 meq via ORAL
  Filled 2017-02-21: qty 1

## 2017-02-21 MED ORDER — SODIUM CHLORIDE 0.9 % IV SOLN: 250.0000 mL | INTRAVENOUS | Status: DC | PRN

## 2017-02-21 MED ORDER — IPRATROPIUM BROMIDE 0.02 % IN SOLN
0.5000 mg | Freq: Once | RESPIRATORY_TRACT | Status: AC
  Administered 2017-02-21: 0.5 mg via RESPIRATORY_TRACT
  Filled 2017-02-21: qty 2.5

## 2017-02-21 MED ORDER — ATORVASTATIN CALCIUM 20 MG PO TABS
20.0000 mg | ORAL_TABLET | Freq: Every day | ORAL | Status: DC
  Administered 2017-02-21 – 2017-02-23 (×3): 20 mg via ORAL
  Filled 2017-02-21 (×3): qty 1

## 2017-02-21 MED ORDER — ATORVASTATIN CALCIUM 40 MG PO TABS: 40.0000 mg | ORAL_TABLET | Freq: Every day | ORAL | Status: DC

## 2017-02-21 MED ORDER — TAMSULOSIN HCL 0.4 MG PO CAPS
0.4000 mg | ORAL_CAPSULE | Freq: Every day | ORAL | Status: DC
  Administered 2017-02-22 – 2017-02-24 (×3): 0.4 mg via ORAL
  Filled 2017-02-21 (×3): qty 1

## 2017-02-21 MED ORDER — ALBUTEROL SULFATE (2.5 MG/3ML) 0.083% IN NEBU
2.5000 mg | INHALATION_SOLUTION | Freq: Once | RESPIRATORY_TRACT | Status: AC
Start: 2017-02-21 — End: 2017-02-21
  Administered 2017-02-21: 2.5 mg via RESPIRATORY_TRACT
  Filled 2017-02-21: qty 3

## 2017-02-21 MED ORDER — ASPIRIN 81 MG PO CHEW
81.0000 mg | CHEWABLE_TABLET | Freq: Every morning | ORAL | Status: DC
  Administered 2017-02-22 – 2017-02-24 (×3): 81 mg via ORAL
  Filled 2017-02-21 (×3): qty 1

## 2017-02-21 MED ORDER — NITROGLYCERIN 0.4 MG SL SUBL: 0.4000 mg | SUBLINGUAL_TABLET | SUBLINGUAL | Status: DC | PRN

## 2017-02-21 MED ORDER — METHYLPREDNISOLONE SODIUM SUCC 40 MG IJ SOLR
40.0000 mg | Freq: Two times a day (BID) | INTRAMUSCULAR | Status: DC
  Administered 2017-02-21 – 2017-02-23 (×4): 40 mg via INTRAVENOUS
  Filled 2017-02-21 (×4): qty 1

## 2017-02-21 MED ORDER — LINAGLIPTIN 5 MG PO TABS
5.0000 mg | ORAL_TABLET | Freq: Every day | ORAL | Status: DC
Start: 2017-02-22 — End: 2017-02-24
  Administered 2017-02-22 – 2017-02-24 (×3): 5 mg via ORAL
  Filled 2017-02-21 (×3): qty 1

## 2017-02-21 MED ORDER — ONDANSETRON HCL 4 MG/2ML IJ SOLN: 4.0000 mg | Freq: Four times a day (QID) | INTRAMUSCULAR | Status: DC | PRN

## 2017-02-21 MED ORDER — ZOLPIDEM TARTRATE 5 MG PO TABS
5.0000 mg | ORAL_TABLET | Freq: Every evening | ORAL | Status: DC | PRN
Start: 2017-02-21 — End: 2017-02-24
  Administered 2017-02-22 – 2017-02-23 (×3): 5 mg via ORAL
  Filled 2017-02-21 (×3): qty 1

## 2017-02-21 MED ORDER — IPRATROPIUM BROMIDE 0.02 % IN SOLN: 0.5000 mg | Freq: Four times a day (QID) | RESPIRATORY_TRACT | Status: DC

## 2017-02-21 MED ORDER — ENOXAPARIN SODIUM 40 MG/0.4ML ~~LOC~~ SOLN
40.0000 mg | SUBCUTANEOUS | Status: DC
  Administered 2017-02-21 – 2017-02-23 (×3): 40 mg via SUBCUTANEOUS
  Filled 2017-02-21 (×3): qty 0.4

## 2017-02-21 MED ORDER — ACETAMINOPHEN 650 MG RE SUPP: 650.0000 mg | Freq: Four times a day (QID) | RECTAL | Status: DC | PRN

## 2017-02-21 MED ORDER — INSULIN ASPART 100 UNIT/ML ~~LOC~~ SOLN
0.0000 [IU] | Freq: Three times a day (TID) | SUBCUTANEOUS | Status: DC
  Administered 2017-02-22: 7 [IU] via SUBCUTANEOUS
  Administered 2017-02-22: 3 [IU] via SUBCUTANEOUS
  Administered 2017-02-22: 2 [IU] via SUBCUTANEOUS
  Administered 2017-02-23: 3 [IU] via SUBCUTANEOUS
  Administered 2017-02-23: 5 [IU] via SUBCUTANEOUS
  Administered 2017-02-23: 7 [IU] via SUBCUTANEOUS
  Administered 2017-02-24: 3 [IU] via SUBCUTANEOUS
  Administered 2017-02-24: 2 [IU] via SUBCUTANEOUS

## 2017-02-21 MED ORDER — ALBUTEROL SULFATE (2.5 MG/3ML) 0.083% IN NEBU: 2.5000 mg | INHALATION_SOLUTION | RESPIRATORY_TRACT | Status: DC | PRN

## 2017-02-21 MED ORDER — FUROSEMIDE 10 MG/ML IJ SOLN
40.0000 mg | Freq: Once | INTRAMUSCULAR | Status: AC
  Administered 2017-02-21: 40 mg via INTRAVENOUS
  Filled 2017-02-21: qty 4

## 2017-02-21 MED ORDER — SODIUM CHLORIDE 0.9% FLUSH: 3.0000 mL | INTRAVENOUS | Status: DC | PRN

## 2017-02-21 MED ORDER — CARVEDILOL 25 MG PO TABS
37.5000 mg | ORAL_TABLET | Freq: Two times a day (BID) | ORAL | Status: DC
  Administered 2017-02-21 – 2017-02-24 (×6): 37.5 mg via ORAL
  Filled 2017-02-21 (×6): qty 1

## 2017-02-21 MED ORDER — ONDANSETRON HCL 4 MG PO TABS: 4.0000 mg | ORAL_TABLET | Freq: Four times a day (QID) | ORAL | Status: DC | PRN

## 2017-02-21 MED ORDER — ALBUTEROL SULFATE (2.5 MG/3ML) 0.083% IN NEBU
5.0000 mg | INHALATION_SOLUTION | Freq: Once | RESPIRATORY_TRACT | Status: AC
  Administered 2017-02-21: 5 mg via RESPIRATORY_TRACT
  Filled 2017-02-21: qty 6

## 2017-02-21 MED ORDER — SODIUM CHLORIDE 0.9 % IV BOLUS (SEPSIS)
500.0000 mL | Freq: Once | INTRAVENOUS | Status: AC
  Administered 2017-02-21: 500 mL via INTRAVENOUS

## 2017-02-21 MED ORDER — SODIUM CHLORIDE 0.9% FLUSH
3.0000 mL | Freq: Two times a day (BID) | INTRAVENOUS | Status: DC
  Administered 2017-02-22 – 2017-02-23 (×2): 3 mL via INTRAVENOUS

## 2017-02-21 MED ORDER — ADULT MULTIVITAMIN W/MINERALS CH
1.0000 | ORAL_TABLET | Freq: Every day | ORAL | Status: DC
  Administered 2017-02-22 – 2017-02-24 (×3): 1 via ORAL
  Filled 2017-02-21 (×3): qty 1

## 2017-02-21 MED ORDER — ACETAMINOPHEN 325 MG PO TABS: 650.0000 mg | ORAL_TABLET | Freq: Four times a day (QID) | ORAL | Status: DC | PRN

## 2017-02-21 MED ORDER — IPRATROPIUM-ALBUTEROL 0.5-2.5 (3) MG/3ML IN SOLN
3.0000 mL | Freq: Four times a day (QID) | RESPIRATORY_TRACT | Status: DC
  Administered 2017-02-21: 3 mL via RESPIRATORY_TRACT
  Filled 2017-02-21: qty 3

## 2017-02-21 MED ORDER — SODIUM CHLORIDE 0.9% FLUSH
3.0000 mL | Freq: Two times a day (BID) | INTRAVENOUS | Status: DC
  Administered 2017-02-21 – 2017-02-24 (×4): 3 mL via INTRAVENOUS

## 2017-02-21 MED ORDER — DM-GUAIFENESIN ER 30-600 MG PO TB12
1.0000 | ORAL_TABLET | Freq: Two times a day (BID) | ORAL | Status: DC | PRN
  Administered 2017-02-24: 1 via ORAL
  Filled 2017-02-21: qty 1

## 2017-02-21 MED ORDER — ALBUTEROL SULFATE (2.5 MG/3ML) 0.083% IN NEBU: 2.5000 mg | INHALATION_SOLUTION | Freq: Four times a day (QID) | RESPIRATORY_TRACT | Status: DC

## 2017-02-21 NOTE — H&P (Signed)
History and Physical    Bradley [name]:[id number] DOB: 17-Dec-1940 DOA: 02/21/2017  PCP: Bradley Plump, MD  Patient coming from: Home   I have personally briefly reviewed patient's old medical records in [name]  Chief Complaint: SOB  HPI: Bradley [name] Sr. is a 76 y.o. male with medical history significant of COPD on 2 L oxygen at home, Chronic Diastolic HF , A fib S/P ablation,  S/P pacemaker who presents complaining of worsening SOB. He report worsening dyspnea for last couple of weeks, and even worse since yesterday. He report productive cough since one week ago. He denies chest pain. He felt some improvement of dyspnea after treatments he received in the ED>   ED Course: Presents with worsening hypoxemia; Chest x ary with small bilateral pleural effusion, partial clearing pulmonary edema. BNP 461, troponin 0.03,   Review of Systems: As per HPI otherwise 10 point review of systems negative.    Past Medical History:  Diagnosis Date  . Abnormal CT scan, chest 2012   CT chest, several lymphadenopathies. Sees pulmonary  . Anemia    intermittent  . Arthritis    "?back" (08/22/2014)  . Atrial fibrillation (HCC)    s/p AV node ablation & BiV PPM implantation 09/08/11 (op dictation pending)  . BPH (benign prostatic hyperplasia)    Saw Dr [name] 2004, normal renal u/s  . CAD (coronary artery disease)   . CHF (congestive heart failure) (HCC)    Thought primarily to be non-systolic although EF down (EF 81-19% 12/2010, down to 35-40% 09/05/11), cath 2008 with no CAD, nuclear study 07/2011 showing Small area of reversibility in the distal ant/lat wall the left ventricle suspicious for ischemia/septal wall HK but felt to be low risk  (per D/C Summary 07/2011)  . Chronic bronchitis (HCC)    "get it ~ q yr"  . Heart murmur   . HLD (hyperlipidemia)   . HTN (hypertension)   . 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 Insomnia   . Migraines    "very  very rare"  . On home oxygen therapy    at night as needed  . Pacemaker   . Peripheral vascular disease (HCC)    ??  . Pleural effusion 2008   S/p decortication  . Pulmonary HTN (HCC)    per cath 2008  . Type II diabetes mellitus (HCC) 1999    Past Surgical History:  Procedure Laterality Date  . BI-VENTRICULAR PACEMAKER INSERTION Left 09/08/2011   Procedure: BI-VENTRICULAR PACEMAKER INSERTION (CRT-P);  Surgeon: Bradley Maw, MD;  Location: [name];  Service: Cardiovascular;  Laterality: Left;  . CARDIAC CATHETERIZATION     "couple times; never had balloon or stent" (09/27/2012)  . COLONOSCOPY  03/10/11   normal  . INSERT / REPLACE / REMOVE PACEMAKER  09/08/11   pacemaker placement  . LUNG DECORTICATION    . PLEURAL SCARIFICATION    . pneumothorax with fibrothorax  ~ 2010  . TONSILLECTOMY     "as a kid" (09/27/2012)     reports that he quit smoking about 36 years ago. His smoking use included Cigarettes. He has a 62.50 pack-year smoking history. He has never used smokeless tobacco. He reports that he does not drink alcohol or use drugs.  Allergies  Allergen Reactions  . Hydrocodone Other (See Comments)    "given to him in the [name]; went thru withdrawals once home; dr said not to take it again" (  09/27/2012)  . Tramadol Other (See Comments)    "given to him in the [name]; went thru withdrawals once home; dr said not to take it again" (09/27/2012)  . Tadalafil Other (See Comments)    headache, backache  . Avelox [Moxifloxacin Hydrochloride] Itching and Other (See Comments)    Headache    Family History  Problem Relation Age of Onset  . Diabetes Father   . Coronary artery disease Father   . Polycythemia Mother   . Drug abuse Son   . Breast cancer Maternal Aunt   . Prostate cancer Neg Hx   . Colon cancer Neg Hx      Prior to Admission medications   Medication Sig Start Date End Date Taking? Authorizing Provider  Acetaminophen (TYLENOL PO) Take 1 tablet  by mouth 2 (two) times daily as needed (pain).    [provider]  albuterol (PROVENTIL HFA;VENTOLIN HFA) 108 (90 Base) MCG/ACT inhaler Inhale 2 puffs into the lungs every 4 (four) hours as needed for wheezing or shortness of breath. 12/20/15   Bradley Gilding, MD  amLODipine (NORVASC) 10 MG tablet Take 1 tablet (10 mg total) by mouth daily. 01/17/17   Bradley Plump, MD  aspirin 81 MG tablet Take 81 mg by mouth every morning.     [provider]  atorvastatin (LIPITOR) 40 MG tablet Take 1 tablet (40 mg total) by mouth daily. 02/08/17   Bradley Plump, MD  calcium-vitamin D (OSCAL WITH D) 500-200 MG-UNIT tablet Take 1 tablet by mouth every morning.    [provider]  carvedilol (COREG) 25 MG tablet Take 1.5 tablets (37.5 mg total) by mouth 2 (two) times daily with a meal. 01/17/17   Bradley Plump, MD  dextromethorphan-guaiFENesin [facility] DM) 30-600 MG 12hr tablet Take 1 tablet by mouth 2 (two) times daily as needed for cough. Patient not taking: Reported on 02/02/2017 12/20/15   Bradley Gilding, MD  furosemide (LASIX) 40 MG tablet Take 1 1/2 tablets by mouth twice daily and as needed 06/25/16   Bradley Maw, MD  losartan (COZAAR) 100 MG tablet Take 1 tablet (100 mg total) by mouth daily. 12/07/16   Bradley Plump, MD  Multiple Vitamins-Minerals (MULTIVITAMINS THER. W/MINERALS) TABS tablet Take 1 tablet by mouth daily. 09/14/13   Bradley Plump, MD  nitroGLYCERIN (NITROSTAT) 0.4 MG SL tablet DISSOLVE 1 TABLET UNDER THE TONGUE EVERY 5 MINUTES AS NEEDED FOR CHEST PAIN Patient not taking: Reported on 08/26/2016 07/14/16   Bradley Maw, MD  ONE TOUCH ULTRA TEST test strip USE TO CHECK BLOOD SUGAR NO MORE THAN TWICE A DAY Patient not taking: Reported on 08/26/2016 06/30/16   Bradley Plump, MD  pantoprazole (PROTONIX) 40 MG tablet TAKE 1 TABLET EVERY MORNING 09/24/16   Bradley Plump, MD  sitaGLIPtin (JANUVIA) 100 MG tablet Take 1 tablet (100 mg total) by mouth daily. 12/21/16   Bradley Plump, MD    tamsulosin (FLOMAX) 0.4 MG CAPS capsule Take 1 capsule (0.4 mg total) by mouth daily. 08/02/16   Bradley Plump, MD  zolpidem (AMBIEN) 10 MG tablet Take 1 tablet (10 mg total) by mouth at bedtime as needed for sleep. 12/29/16   Bradley Plump, MD    Physical Exam: Vitals:   02/21/17 1400 02/21/17 1415 02/21/17 1447 02/21/17 1543  BP: 117/72 126/75 (!) 108/92 (!) 145/97  Pulse: 69 (!) 35 69 72  Resp: (!) 22 (!) 23 16   TempSrc:  Oral  SpO2: 93% (!) 89% 96% 90%    Constitutional: NAD, calm, comfortable Vitals:   02/21/17 1400 02/21/17 1415 02/21/17 1447 02/21/17 1543  BP: 117/72 126/75 (!) 108/92 (!) 145/97  Pulse: 69 (!) 35 69 72  Resp: (!) 22 (!) 23 16   TempSrc:    Oral  SpO2: 93% (!) 89% 96% 90%   Eyes: PERRL, lids and conjunctivae normal ENMT: Mucous membranes are moist. Posterior pharynx clear of any exudate or lesions.Normal dentition.  Neck: normal, supple, no masses, no thyromegaly Respiratory: Bilateral ronchus, crackles, puffing cheeck chronic per patient. No accessory muscle use.  Cardiovascular: IRegular rate and rhythm, systolic murmurs / rubs / gallops. No extremity edema. 2+ pedal pulses. No carotid bruits.  Abdomen: no tenderness, no masses palpated. No hepatosplenomegaly. Bowel sounds positive.  Musculoskeletal: no clubbing / cyanosis. No joint deformity upper and lower extremities. Good ROM, no contractures. Normal muscle tone.  Skin: no rashes, lesions, ulcers. No induration Neurologic: CN 2-12 grossly intact. Sensation intact, DTR normal. Strength 5/5 in all 4.  Psychiatric: Normal judgment and insight. Alert and oriented x 3. Normal mood.     Labs on Admission: I have personally reviewed following labs and imaging studies  CBC:  Recent Labs Lab 02/21/17 0858  WBC 5.3  NEUTROABS 3.6  HGB 14.2  HCT 41.3  MCV 89.8  PLT 189   Basic Metabolic Panel:  Recent Labs Lab 02/21/17 0858  NA 140  K 3.8  CL 102  CO2 32  GLUCOSE 154*  BUN 12  CREATININE  0.80  CALCIUM 8.6*   GFR: CrCl cannot be calculated (Unknown ideal weight.). Liver Function Tests:  Recent Labs Lab 02/21/17 0858  AST 19  ALT 17  ALKPHOS 82  BILITOT 0.8  PROT 6.7  ALBUMIN 3.6    Recent Labs Lab 02/21/17 0858  LIPASE 23   No results for input(s): AMMONIA in the last 168 hours. Coagulation Profile: No results for input(s): INR, PROTIME in the last 168 hours. Cardiac Enzymes:  Recent Labs Lab 02/21/17 0858  TROPONINI <0.03   BNP (last 3 results) No results for input(s): PROBNP in the last 8760 hours. HbA1C: No results for input(s): HGBA1C in the last 72 hours. CBG: No results for input(s): GLUCAP in the last 168 hours. Lipid Profile: No results for input(s): CHOL, HDL, LDLCALC, TRIG, CHOLHDL, LDLDIRECT in the last 72 hours. Thyroid Function Tests: No results for input(s): TSH, T4TOTAL, FREET4, T3FREE, THYROIDAB in the last 72 hours. Anemia Panel: No results for input(s): VITAMINB12, FOLATE, FERRITIN, TIBC, IRON, RETICCTPCT in the last 72 hours. Urine analysis:    Component Value Date/Time   COLORURINE YELLOW 07/15/2015 1440   APPEARANCEUR CLEAR 07/15/2015 1440   LABSPEC 1.015 07/15/2015 1440   PHURINE 6.0 07/15/2015 1440   GLUCOSEU NEGATIVE 07/15/2015 1440   HGBUR TRACE-LYSED (A) 07/15/2015 1440   BILIRUBINUR NEGATIVE 07/15/2015 1440   KETONESUR TRACE (A) 07/15/2015 1440   PROTEINUR NEGATIVE 07/06/2012 0341   UROBILINOGEN 1.0 07/15/2015 1440   NITRITE NEGATIVE 07/15/2015 1440   LEUKOCYTESUR NEGATIVE 07/15/2015 1440    Radiological Exams on Admission: Dg Chest 2 View  Result Date: 02/21/2017 CLINICAL DATA:  Shortness of breath. EXAM: CHEST  2 VIEW COMPARISON:  12/18/2015 FINDINGS: Cardiac pacer noted with lead tips over the right ventricle. Cardiomegaly with interim partial clearing of bilateral from interstitial prominence. Small bilateral pleural effusions noted. No pneumothorax . IMPRESSION: Cardiac pacer stable position. Cardiomegaly  with interim partial clearing of pulmonary interstitial edema. Small  bilateral pleural effusions again noted. Electronically Signed   By: Maisie Fus  Register   On: 02/21/2017 09:35    EKG: Independently reviewed. A fib 120  Assessment/Plan Active Problems:   CHF (congestive heart failure) (HCC)   Heart failure (HCC)  1-Acute on chronic hypoxic Respiratory Failure; this could be related to COPD exacerbation and or CHF.  Admit to telemetry.  IV lasix 60 mg IV BID.  Schedule nebulizer, IV solumedrol. guaifenesin for cough.    2-A fib; Continue with coreg.   3-Acute diastolic HF exacerbation. //CAD IV lasix 60 mg  BID.  Strict I and O Daily weight,  His regular weight is around 225.  Continue with statins and aspirin.   4-DM;  Continue with tradjenta.  SSI.   5-HTN;  Continue with Cozaar, Norvasc.   DVT prophylaxis: Lovenox Code Status: DNR, per patient wishes.  Family Communication: none at bedside,  Disposition Plan:  Home when stable.  Consults called: none Admission status: Inpatient telemetry    Hartley Barefoot A MD [facility] Pager [phone]  If 7PM-7AM, please contact night-coverage [email or web address] Password [email or web address]  02/21/2017, 4:10 PM

## 2017-02-21 NOTE — ED Triage Notes (Signed)
Pt initially refuses w/c for transport to room, pt amb approx 100 feet and noted sob and cyanosis to lips. This rn insists that pt sit in w/c, taken directly to room 9. Room air sats 78%, states he wears o2 at 2lpm cont, but does not have his port tank with him today. Pt placed on o2 at 2lpm, sats increase to 90%. Pt states he has had some intermittent cp, but none present at this time. ekg performed during triage, rt alerted to bedside.

## 2017-02-21 NOTE — ED Provider Notes (Signed)
[facility] Provider Note   CSN: [id number] Arrival date & time: 02/21/17  0831     History   Chief Complaint Chief Complaint  Patient presents with  . Shortness of Breath    HPI Bradley WINKLEMAN Sr. is a 76 y.o. male with PMHx CAD, DM, CHF, COPD, pulmonary HTN With chief complaint increased shortness of breath and fatigue for the past few days. He states he wakes up in the mornings feeling fatigued, feels better after breakfast, with worsening throughout the day. He is on 2 L [place] at home with portable O2 when necessary. He also endorses nasal congestion and cough intermittent productive of light or dark sputum, denies hemoptysis. Robitussin and Excedrin PM have been helpful for his symptoms. States he has been taking his medications as prescribed.  Denies CP, but endorses mild epigastric cramping. Denies orthopnea, PND, leg swelling. Denies fevers, chills, n/v/d, dysuria, hematuria, melena, HA, dizziness.    The history is provided by the patient.    Past Medical History:  Diagnosis Date  . Abnormal CT scan, chest 2012   CT chest, several lymphadenopathies. Sees pulmonary  . Anemia    intermittent  . Arthritis    "?back" (08/22/2014)  . Atrial fibrillation (HCC)    s/p AV node ablation & BiV PPM implantation 09/08/11 (op dictation pending)  . BPH (benign prostatic hyperplasia)    Saw Dr [name] 2004, normal renal u/s  . CAD (coronary artery disease)   . CHF (congestive heart failure) (HCC)    Thought primarily to be non-systolic although EF down (EF 40-98% 12/2010, down to 35-40% 09/05/11), cath 2008 with no CAD, nuclear study 07/2011 showing Small area of reversibility in the distal ant/lat wall the left ventricle suspicious for ischemia/septal wall HK but felt to be low risk  (per D/C Summary 07/2011)  . Chronic bronchitis (HCC)    "get it ~ q yr"  . Heart murmur   . HLD (hyperlipidemia)   . HTN (hypertension)   . 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 Insomnia   . Migraines    "very very rare"  . On home oxygen therapy    at night as needed  . Pacemaker   . Peripheral vascular disease (HCC)    ??  . Pleural effusion 2008   S/p decortication  . Pulmonary HTN (HCC)    per cath 2008  . Type II diabetes mellitus (HCC) 1999    Patient Active Problem List   Diagnosis Date Noted  . CHF (congestive heart failure) (HCC) 02/21/2017  . Heart failure (HCC) 02/21/2017  . Left knee injury 02/10/2016  . Right wrist sprain 02/10/2016  . Acute on chronic respiratory failure with hypoxia (HCC) 12/19/2015  . PCP NOTES >>> 07/15/2015  . COPD exacerbation (HCC) 03/12/2015  . Acute bronchitis vs Upper Airway cough syndrome 09/07/2014  . CAD (coronary artery disease) of artery bypass graft 09/14/2013  . Acute on chronic combined systolic and diastolic congestive heart failure (HCC) 09/27/2012  . Pain in joints 07/11/2012  . Annual physical exam 05/08/2012  . 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 Lymphadenopathy 09/23/2011  . Sinoatrial node dysfunction (HCC)   . 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 Systolic and diastolic CHF, acute (HCC) 09/04/2011  . Chronic diastolic heart failure (HCC)   . CLAUDICATION 10/22/2010  . DERMATITIS, STASIS 09/14/2010  . Chronic respiratory failure (HCC) 09/14/2010  . Chronic pulmonary hypertension secondary to elevated L H pressures  04/15/2009  . ERECTILE  DYSFUNCTION 01/06/2009  . Hyperlipidemia 12/13/2007  . Essential hypertension 12/13/2007  . Anxiety--insomnia 08/14/2007  . DM II (diabetes mellitus, type II), controlled (HCC) 01/02/2007  . Permanent atrial fibrillation 01/02/2007  . BENIGN PROSTATIC HYPERTROPHY 01/02/2007    Past Surgical History:  Procedure Laterality Date  . BI-VENTRICULAR PACEMAKER INSERTION Left 09/08/2011   Procedure: BI-VENTRICULAR PACEMAKER INSERTION (CRT-P);  Surgeon: Marinus Maw, MD;  Location: [facility] CATH LAB;  Service: Cardiovascular;  Laterality:  Left;  . CARDIAC CATHETERIZATION     "couple times; never had balloon or stent" (09/27/2012)  . COLONOSCOPY  03/10/11   normal  . INSERT / REPLACE / REMOVE PACEMAKER  09/08/11   pacemaker placement  . LUNG DECORTICATION    . PLEURAL SCARIFICATION    . pneumothorax with fibrothorax  ~ 2010  . TONSILLECTOMY     "as a kid" (09/27/2012)       Home Medications    Prior to Admission medications   Medication Sig Start Date End Date Taking? Authorizing Provider  Acetaminophen (TYLENOL PO) Take 1 tablet by mouth 2 (two) times daily as needed (pain).   Yes [provider]  albuterol (PROVENTIL HFA;VENTOLIN HFA) 108 (90 Base) MCG/ACT inhaler Inhale 2 puffs into the lungs every 4 (four) hours as needed for wheezing or shortness of breath. 12/20/15  Yes Leatha Gilding, MD  amLODipine (NORVASC) 10 MG tablet Take 1 tablet (10 mg total) by mouth daily. 01/17/17  Yes [name], MD  aspirin 81 MG tablet Take 81 mg by mouth every morning.    Yes [provider]  atorvastatin (LIPITOR) 20 MG tablet Take 20 mg by mouth at bedtime.   Yes [provider]  calcium-vitamin D (OSCAL WITH D) 500-200 MG-UNIT tablet Take 1 tablet by mouth every morning.   Yes [provider]  carvedilol (COREG) 25 MG tablet Take 1.5 tablets (37.5 mg total) by mouth 2 (two) times daily with a meal. 01/17/17  Yes Paz, Nolon Rod, MD  furosemide (LASIX) 40 MG tablet Take 1 1/2 tablets by mouth twice daily and as needed Patient taking differently: Take 60 mg by mouth daily.  06/25/16  Yes Marinus Maw, MD  losartan (COZAAR) 100 MG tablet Take 1 tablet (100 mg total) by mouth daily. 12/07/16  Yes [name], MD  Multiple Vitamins-Minerals (MULTIVITAMINS THER. W/MINERALS) TABS tablet Take 1 tablet by mouth daily. 09/14/13  Yes Paz, Nolon Rod, MD  nitroGLYCERIN (NITROSTAT) 0.4 MG SL tablet DISSOLVE 1 TABLET UNDER THE TONGUE EVERY 5 MINUTES AS NEEDED FOR CHEST PAIN 07/14/16  Yes Marinus Maw, MD  ONE TOUCH  ULTRA TEST test strip USE TO CHECK BLOOD SUGAR NO MORE THAN TWICE A DAY 06/30/16  Yes [name], MD  pantoprazole (PROTONIX) 40 MG tablet TAKE 1 TABLET EVERY MORNING 09/24/16  Yes Paz, Elita Quick E, MD  sitaGLIPtin (JANUVIA) 100 MG tablet Take 1 tablet (100 mg total) by mouth daily. 12/21/16  Yes Paz, Nolon Rod, MD  tamsulosin (FLOMAX) 0.4 MG CAPS capsule Take 1 capsule (0.4 mg total) by mouth daily. 08/02/16  Yes Paz, Nolon Rod, MD  zolpidem (AMBIEN) 10 MG tablet Take 1 tablet (10 mg total) by mouth at bedtime as needed for sleep. 12/29/16  Yes [name], MD    Family History Family History  Problem Relation Age of Onset  . Diabetes Father   . Coronary artery disease Father   . Polycythemia Mother   . Drug abuse Son   .  Breast cancer Maternal Aunt   . Prostate cancer Neg Hx   . Colon cancer Neg Hx     Social History Social History  Substance Use Topics  . Smoking status: Former Smoker    Packs/day: 2.50    Years: 25.00    Types: Cigarettes    Quit date: 10/11/1980  . Smokeless tobacco: Never Used     Comment: used to smoke 2.5 ppd for 25 years  . Alcohol use No     Comment: 08/22/2014 "maybe q couple months I'll have a beer w/dinner"     Allergies   Hydrocodone; Tramadol; Tadalafil; and Avelox [moxifloxacin hydrochloride]   Review of Systems Review of Systems  Constitutional: Negative for chills and fever.  HENT: Positive for congestion. Negative for sinus pain, sinus pressure and sore throat.   Respiratory: Positive for cough and shortness of breath.   Cardiovascular: Negative for chest pain.  Gastrointestinal: Positive for abdominal pain. Negative for blood in stool, constipation, diarrhea, nausea and vomiting.  Genitourinary: Negative for dysuria and hematuria.  Neurological: Negative for syncope and headaches.  All other systems reviewed and are negative.    Physical Exam Updated Vital Signs BP (!) 145/97 (BP Location: Right Arm)   Pulse 72   Temp 98.5 F (36.9 C)  (Oral)   Resp 16   Ht 5\' 9"  (1.753 m)   Wt 84.3 kg   SpO2 (!) 89%   BMI 27.44 kg/m   Physical Exam  Constitutional: He is oriented to person, place, and time.  HENT:  Head: Normocephalic and atraumatic.  Eyes: Right eye exhibits no discharge. Left eye exhibits no discharge. No scleral icterus.  Neck: No JVD present. No tracheal deviation present.  Cardiovascular: Intact distal pulses.   Tachycardic at 106bpm, regularly irregular rhythm, 2+ radial and DP/PT pulses bl, negative Homan's bl   Pulmonary/Chest: He has wheezes. He exhibits no tenderness.  Diffuse rhonchi, scattered rales, increased WOB with puffing cheeks but no accessory muscle use  Abdominal: Soft. Bowel sounds are normal. He exhibits no distension. There is tenderness.  LUQ ttp, negative rovsing's, no ttp at mcburney's point, negative murphey's  Musculoskeletal: He exhibits no edema.  Neurological: He is alert and oriented to person, place, and time.  Skin: Skin is warm and dry. Capillary refill takes less than 2 seconds.  No cyanosis noted  Psychiatric: He has a normal mood and affect. His behavior is normal.     ED Treatments / Results  Labs (all labs ordered are listed, but only abnormal results are displayed) Labs Reviewed  COMPREHENSIVE METABOLIC PANEL - Abnormal; Notable for the following:       Result Value   Glucose, Bld 154 (*)    Calcium 8.6 (*)    All other components within normal limits  BRAIN NATRIURETIC PEPTIDE - Abnormal; Notable for the following:    B Natriuretic Peptide 461.3 (*)    All other components within normal limits  GLUCOSE, CAPILLARY - Abnormal; Notable for the following:    Glucose-Capillary 113 (*)    All other components within normal limits  CBC WITH DIFFERENTIAL/PLATELET  TROPONIN I  LIPASE, BLOOD    EKG  EKG Interpretation  Date/Time:  Monday Feb 21 2017 08:49:39 EDT Ventricular Rate:  127 PR Interval:    QRS Duration: 145 QT Interval:  415 QTC  Calculation: 439 R Axis:   -100 Text Interpretation:  Atrial fibrillation Paired ventricular premature complexes Right bundle branch block Anterolateral infarct, age indeterminate Confirmed by DELO  MD, DOUGLAS ([id number]) on 02/21/2017 1:13:30 PM       Radiology Dg Chest 2 View  Result Date: 02/21/2017 CLINICAL DATA:  Shortness of breath. EXAM: CHEST  2 VIEW COMPARISON:  12/18/2015 FINDINGS: Cardiac pacer noted with lead tips over the right ventricle. Cardiomegaly with interim partial clearing of bilateral from interstitial prominence. Small bilateral pleural effusions noted. No pneumothorax . IMPRESSION: Cardiac pacer stable position. Cardiomegaly with interim partial clearing of pulmonary interstitial edema. Small bilateral pleural effusions again noted. Electronically Signed   By: Maisie Fus  Register   On: 02/21/2017 09:35    Procedures Procedures (including critical care time)  Medications Ordered in ED Medications  amLODipine (NORVASC) tablet 10 mg (not administered)  carvedilol (COREG) tablet 37.5 mg (37.5 mg Oral Given 02/21/17 1834)  zolpidem (AMBIEN) tablet 5 mg (not administered)  linagliptin (TRADJENTA) tablet 5 mg (not administered)  losartan (COZAAR) tablet 100 mg (not administered)  pantoprazole (PROTONIX) EC tablet 40 mg (not administered)  nitroGLYCERIN (NITROSTAT) SL tablet 0.4 mg (not administered)  tamsulosin (FLOMAX) capsule 0.4 mg (not administered)  dextromethorphan-guaiFENesin (MUCINEX DM) 30-600 MG per 12 hr tablet 1 tablet (not administered)  aspirin chewable tablet 81 mg (not administered)  multivitamin with minerals tablet 1 tablet (not administered)  enoxaparin (LOVENOX) injection 40 mg (40 mg Subcutaneous Given 02/21/17 1835)  sodium chloride flush (NS) 0.9 % injection 3 mL (not administered)  sodium chloride flush (NS) 0.9 % injection 3 mL (not administered)  sodium chloride flush (NS) 0.9 % injection 3 mL (not administered)  0.9 %  sodium chloride infusion (not  administered)  acetaminophen (TYLENOL) tablet 650 mg (not administered)    Or  acetaminophen (TYLENOL) suppository 650 mg (not administered)  ondansetron (ZOFRAN) tablet 4 mg (not administered)    Or  ondansetron (ZOFRAN) injection 4 mg (not administered)  guaiFENesin (MUCINEX) 12 hr tablet 600 mg (not administered)  insulin aspart (novoLOG) injection 0-9 Units (0 Units Subcutaneous Not Given 02/21/17 1700)  methylPREDNISolone sodium succinate (SOLU-MEDROL) 40 mg/mL injection 40 mg (40 mg Intravenous Given 02/21/17 1835)  furosemide (LASIX) injection 60 mg (60 mg Intravenous Given 02/21/17 1835)  ipratropium-albuterol (DUONEB) 0.5-2.5 (3) MG/3ML nebulizer solution 3 mL (3 mLs Nebulization Given 02/21/17 2020)  albuterol (PROVENTIL) (2.5 MG/3ML) 0.083% nebulizer solution 2.5 mg (not administered)  atorvastatin (LIPITOR) tablet 20 mg (20 mg Oral Given 02/21/17 1835)  ipratropium (ATROVENT) nebulizer solution 0.5 mg (0.5 mg Nebulization Given 02/21/17 1023)  albuterol (PROVENTIL) (2.5 MG/3ML) 0.083% nebulizer solution 5 mg (5 mg Nebulization Given 02/21/17 1022)  sodium chloride 0.9 % bolus 500 mL (500 mLs Intravenous Transfusing/Transfer 02/21/17 1424)  furosemide (LASIX) injection 40 mg (40 mg Intravenous Given 02/21/17 1113)  albuterol (PROVENTIL) (2.5 MG/3ML) 0.083% nebulizer solution 2.5 mg (2.5 mg Nebulization Given 02/21/17 1155)  ipratropium (ATROVENT) nebulizer solution 0.5 mg (0.5 mg Nebulization Given 02/21/17 1155)  potassium chloride SA (K-DUR,KLOR-CON) CR tablet 20 mEq (20 mEq Oral Given 02/21/17 1835)     Initial Impression / Assessment and Plan / ED Course  I have reviewed the triage vital signs and the nursing notes.  Pertinent labs & imaging results that were available during my care of the patient were reviewed by me and considered in my medical decision making (see chart for details).     Patient with worsening shortness of breath, fatigue. Afebrile, SpO2 78% RA while in triage,  with improvement to 90%-95% on 2LPM via [place]. Scattered wheezes on auscultation, given nebulizer with no improvement in wheezing or in patient's shortness  of breath. Chest x-ray shows small bilateral pleural effusions, BNP moderately elevated at 461. Troponin negative. EKG shows paced rhythm. Low suspicion of ACS or MI. Possible CHF or more likely COPD exacerbation. Dr. Judd Lien saw and evaluated patient, consulted hospitalist service. With patient's persistent shortness of breath and hypoxia, they will assume care and patient will be brought into the hospital for further evaluation and management of COPD exacerbation.   Final Clinical Impressions(s) / ED Diagnoses   Final diagnoses:  COPD exacerbation [facility])    New Prescriptions Current Discharge Medication List       Bennye Alm 02/21/17 2108    Geoffery Lyons, MD 02/28/17 0120

## 2017-02-21 NOTE — ED Notes (Signed)
Pt to xray

## 2017-02-21 NOTE — Progress Notes (Signed)
Transfer from [facility], pt with dyspnea,? CHF and COPD, tele bed requested.   Debbora Presto, MD  [facility] Pager [phone]  If 7PM-7AM, please contact night-coverage [email or web address] Password [email or web address]

## 2017-02-22 ENCOUNTER — Telehealth: Payer: Self-pay | Admitting: Cardiology

## 2017-02-22 ENCOUNTER — Inpatient Hospital Stay (HOSPITAL_COMMUNITY): Payer: Medicare Other

## 2017-02-22 ENCOUNTER — Ambulatory Visit (INDEPENDENT_AMBULATORY_CARE_PROVIDER_SITE_OTHER): Payer: Medicare Other | Admitting: *Deleted

## 2017-02-22 DIAGNOSIS — I495 Sick sinus syndrome: Secondary | ICD-10-CM | POA: Diagnosis not present

## 2017-02-22 DIAGNOSIS — I110 Hypertensive heart disease with heart failure: Principal | ICD-10-CM

## 2017-02-22 DIAGNOSIS — I5031 Acute diastolic (congestive) heart failure: Secondary | ICD-10-CM

## 2017-02-22 DIAGNOSIS — I340 Nonrheumatic mitral (valve) insufficiency: Secondary | ICD-10-CM

## 2017-02-22 DIAGNOSIS — I739 Peripheral vascular disease, unspecified: Secondary | ICD-10-CM

## 2017-02-22 DIAGNOSIS — I482 Chronic atrial fibrillation: Secondary | ICD-10-CM

## 2017-02-22 LAB — BASIC METABOLIC PANEL
Anion gap: 9 (ref 5–15)
BUN: 14 mg/dL (ref 6–20)
CO2: 32 mmol/L (ref 22–32)
Calcium: 8.8 mg/dL — ABNORMAL LOW (ref 8.9–10.3)
Chloride: 100 mmol/L — ABNORMAL LOW (ref 101–111)
Creatinine, Ser: 0.82 mg/dL (ref 0.61–1.24)
GFR calc Af Amer: >60 mL/min (ref >60)
GFR calc non Af Amer: >60 mL/min (ref >60)
Glucose, Bld: 196 mg/dL — ABNORMAL HIGH (ref 65–99)
Potassium: 4.5 mmol/L (ref 3.5–5.1)
Sodium: 141 mmol/L (ref 135–145)

## 2017-02-22 LAB — GLUCOSE, CAPILLARY
Glucose-Capillary: 178 mg/dL — ABNORMAL HIGH (ref 65–99)
Glucose-Capillary: 333 mg/dL — ABNORMAL HIGH (ref 65–99)
Glucose-Capillary: 226 mg/dL — ABNORMAL HIGH (ref 65–99)
Glucose-Capillary: 250 mg/dL — ABNORMAL HIGH (ref 65–99)

## 2017-02-22 LAB — CBC
HCT: 42.8 % (ref 39.0–52.0)
Hemoglobin: 14.5 g/dL (ref 13.0–17.0)
MCH: 30.1 pg (ref 26.0–34.0)
MCHC: 33.9 g/dL (ref 30.0–36.0)
MCV: 89 fL (ref 78.0–100.0)
Platelets: 208 10*3/uL (ref 150–400)
RBC: 4.81 MIL/uL (ref 4.22–5.81)
RDW: 13.7 % (ref 11.5–15.5)
WBC: 4.4 10*3/uL (ref 4.0–10.5)

## 2017-02-22 LAB — ECHOCARDIOGRAM COMPLETE
Height: 69 in
Weight: 2964.8 oz

## 2017-02-22 MED ORDER — IPRATROPIUM-ALBUTEROL 0.5-2.5 (3) MG/3ML IN SOLN
3.0000 mL | Freq: Three times a day (TID) | RESPIRATORY_TRACT | Status: DC
  Administered 2017-02-22 – 2017-02-24 (×7): 3 mL via RESPIRATORY_TRACT
  Filled 2017-02-22 (×7): qty 3

## 2017-02-22 NOTE — Progress Notes (Signed)
PROGRESS NOTE    Bradley [name]  LKG:[id number] DOB: October 17, 1940 DOA: 02/21/2017 PCP: [name], MD    Brief Narrative: Bradley Searles Sr. is a 76 y.o. male with medical history significant of COPD on 2 L oxygen at home, Chronic Diastolic HF , A fib S/P ablation,  S/P pacemaker who presents complaining of worsening SOB. He report worsening dyspnea for last couple of weeks, and even worse since yesterday. He report productive cough since one week ago. He denies chest pain. He felt some improvement of dyspnea after treatments he received in the ED>   ED Course: Presents with worsening hypoxemia; Chest x ary with small bilateral pleural effusion, partial clearing pulmonary edema. BNP 461, troponin 0.03,   Assessment & Plan:   Active Problems:   CHF (congestive heart failure) (HCC)   Heart failure (HCC)   1-Acute on chronic hypoxic Respiratory Failure; this could be related to COPD exacerbation and or CHF.  Continue with IV lasix 60 mg IV BID.  Schedule nebulizer, IV solumedrol. guaifenesin for cough.  Improving.   2-A fib; Continue with coreg.   3-Acute diastolic HF exacerbation. //CAD Continue with IV lasix 60 mg  BID.  Strict I and O; negative 2.2 L./  Daily weight ; 185 ---185 pound.  Continue with statins and aspirin.  He is due for interrogation of pacer. Cardiology consulted.  Renal function stable.   4-DM;  Continue with tradjenta.  SSI.   5-HTN;  Continue with Cozaar, Norvasc.       DVT prophylaxis: Lovenox Code Status: DNR Family Communication: care discussed with patient  Disposition Plan: home at time of discharge    Consultants:   Cardiology   Procedures:  Echo pending.    Antimicrobials:   None    Subjective: He is feeling better, breathing better. Not at baseline yet.   Objective: Vitals:   02/21/17 2020 02/21/17 2302 02/22/17 0633 02/22/17 0836  BP:  112/67 132/78   Pulse:  79 70   Resp:  20 20   Temp:  98 F (36.7 C) 97.5  F (36.4 C)   TempSrc:  Oral Oral   SpO2: (!) 89% 96% 99% 100%  Weight:   84.1 kg (185 lb 4.8 oz)   Height:        Intake/Output Summary (Last 24 hours) at 02/22/17 0846 Last data filed at 02/21/17 2032  Gross per 24 hour  Intake              120 ml  Output             2245 ml  Net            -2125 ml   Filed Weights   02/21/17 1755 02/22/17 0633  Weight: 84.3 kg (185 lb 12.8 oz) 84.1 kg (185 lb 4.8 oz)    Examination:  General exam: Appears calm and comfortable  Respiratory system:Respiratory effort normal. Less bilateral ronchus, and crackles.  Cardiovascular system: S1 & S2 heard, RRR.positive  JVD, systolic  murmurs, rubs, gallops or clicks. Trace edema  Gastrointestinal system: Abdomen is distended, soft and nontender. No organomegaly or masses felt. Normal bowel sounds heard. Central nervous system: Alert and oriented. No focal neurological deficits. Extremities: Symmetric 5 x 5 power. Skin: No rashes, lesions or ulcers Psychiatry: Judgement and insight appear normal. Mood & affect appropriate.     Data Reviewed: I have personally reviewed following labs and imaging studies  CBC:  Recent Labs Lab 02/21/17 0858  02/22/17 0803  WBC 5.3 4.4  NEUTROABS 3.6  --   HGB 14.2 14.5  HCT 41.3 42.8  MCV 89.8 89.0  PLT 189 208   Basic Metabolic Panel:  Recent Labs Lab 02/21/17 0858 02/22/17 0803  NA 140 141  K 3.8 4.5  CL 102 100*  CO2 32 32  GLUCOSE 154* 196*  BUN 12 14  CREATININE 0.80 0.82  CALCIUM 8.6* 8.8*   GFR: Estimated Creatinine Clearance: 76.6 mL/min (by C-G formula based on SCr of 0.82 mg/dL). Liver Function Tests:  Recent Labs Lab 02/21/17 0858  AST 19  ALT 17  ALKPHOS 82  BILITOT 0.8  PROT 6.7  ALBUMIN 3.6    Recent Labs Lab 02/21/17 0858  LIPASE 23   No results for input(s): AMMONIA in the last 168 hours. Coagulation Profile: No results for input(s): INR, PROTIME in the last 168 hours. Cardiac Enzymes:  Recent Labs Lab  02/21/17 0858  TROPONINI <0.03   BNP (last 3 results) No results for input(s): PROBNP in the last 8760 hours. HbA1C: No results for input(s): HGBA1C in the last 72 hours. CBG:  Recent Labs Lab 02/21/17 1714 02/21/17 2259 02/22/17 0731  GLUCAP 113* 184* 178*   Lipid Profile: No results for input(s): CHOL, HDL, LDLCALC, TRIG, CHOLHDL, LDLDIRECT in the last 72 hours. Thyroid Function Tests: No results for input(s): TSH, T4TOTAL, FREET4, T3FREE, THYROIDAB in the last 72 hours. Anemia Panel: No results for input(s): VITAMINB12, FOLATE, FERRITIN, TIBC, IRON, RETICCTPCT in the last 72 hours. Sepsis Labs: No results for input(s): PROCALCITON, LATICACIDVEN in the last 168 hours.  No results found for this or any previous visit (from the past 240 hour(s)).       Radiology Studies: Dg Chest 2 View  Result Date: 02/21/2017 CLINICAL DATA:  Shortness of breath. EXAM: CHEST  2 VIEW COMPARISON:  12/18/2015 FINDINGS: Cardiac pacer noted with lead tips over the right ventricle. Cardiomegaly with interim partial clearing of bilateral from interstitial prominence. Small bilateral pleural effusions noted. No pneumothorax . IMPRESSION: Cardiac pacer stable position. Cardiomegaly with interim partial clearing of pulmonary interstitial edema. Small bilateral pleural effusions again noted. Electronically Signed   By: Maisie Fus  Register   On: 02/21/2017 09:35        Scheduled Meds: . amLODipine  10 mg Oral Daily  . aspirin  81 mg Oral q morning - 10a  . atorvastatin  20 mg Oral q1800  . carvedilol  37.5 mg Oral BID WC  . enoxaparin (LOVENOX) injection  40 mg Subcutaneous Q24H  . furosemide  60 mg Intravenous Q12H  . guaiFENesin  600 mg Oral BID  . insulin aspart  0-9 Units Subcutaneous TID WC  . ipratropium-albuterol  3 mL Nebulization TID  . linagliptin  5 mg Oral Daily  . losartan  100 mg Oral Daily  . methylPREDNISolone (SOLU-MEDROL) injection  40 mg Intravenous Q12H  . multivitamin  with minerals  1 tablet Oral Daily  . pantoprazole  40 mg Oral q morning - 10a  . sodium chloride flush  3 mL Intravenous Q12H  . sodium chloride flush  3 mL Intravenous Q12H  . tamsulosin  0.4 mg Oral Daily   Continuous Infusions: . sodium chloride       LOS: 1 day    Time spent: 35 minutes.     Alba Cory, MD [facility] Pager [phone]  If 7PM-7AM, please contact night-coverage [email or web address] Password [facility] [place] 02/22/2017, 8:46 AM

## 2017-02-22 NOTE — Consult Note (Signed)
Cardiology Consultation:   Patient ID: Bradley NIES Sr.; [id number]; 1940/10/23   Admit date: 02/21/2017 Date of Consult: 02/22/2017  Primary Care Provider: [name], MD Primary Cardiologist:  New to Dr. Duke Salvia Primary Electrophysiologist:  Lewayne Bunting, MD (last seen in 2017)   Patient Profile:   Bradley Searles Sr. is a 76 y.o. male DNR with a hx of chronic atrial fibrillation, nonischemic cardiomyopathy s/p AV node ablation and biventricular pace maker insertion (2012), CAD, chronic systolic HF, unspecified heart murmur, hyperlipidemia, hypertension, intracranial bleed (2013) dc'd coumadin permanently, COPD  2 L O2, peripheral vascular disease, pulmonary hypertension dx in Cath 2008, type II diabetes who is being seen today for the evaluation of heart failure and atrial fibrillation at the request of Dr. Sunnie Nielsen.  History of Present Illness:   Bradley ESTERLINE Sr. is a chronically ill patient will multiple medical problems. Bradley [name] last saw Dr. Ladona Ridgel in the office on August 2017 for follow-up; his atrial fibrillation was well controlled, his medtronic biv PPM working normally and his chronic systolic heart failure remained class 2.  Bradley [name] has significant COPD requiring home O2, diabetes which is well controlled per his PCP, hyperlipidemia well controlled on statin, and well managed chronic diastolic heart failure. As far as I can see Bradley [name] appears to be very compliant with his medications and knowledgeable about his care.    Bradley [name] presented to the Emergency Department at [facility] yesterday with complaints of shortness of breath and fatigue for approximately a week. Bradley [name] has been waking up feeling fatigued and it increases throughout the day. Bradley [name] has not had any CP, orthopnea, palpitations. Bradley [name] has experienced epigastric cramping. Bradley [name] was noted to be hypoxic in the ER 78% on room air, improved to 90-95% on 2L [place]. Scattered wheezes on exam, chest xray showed small bilateral pleural effusions with  BNP at 461 (BNP 171.3 in 2017). Negative Troponins x 1.  Albumin is 3.6. LDL is 53. Cr 0.82, K 4.5, Hbg 14.5.   Bradley [name] was admitted to the medicine service and transferred to [facility] with the diagnosis of acute on chronic hypoxic respiratory failure, atrial fibrillation, and acute on chronic systolic heart failure.  Since admission Bradley [name] has diuresed 2.125 liters.   Bradley [name] is feeling okay this morning. Tired from coughing, fatigue and SOB. Denies any leg or abdominal swelling and describes this episode as "coming out of nowhere". Currently in sinus    Past Medical History:  Diagnosis Date  . Abnormal CT scan, chest 2012   CT chest, several lymphadenopathies. Sees pulmonary  . Anemia    intermittent  . Arthritis    "?back" (08/22/2014)  . Atrial fibrillation (HCC)    s/p AV node ablation & BiV PPM implantation 09/08/11 (op dictation pending)  . BPH (benign prostatic hyperplasia)    Saw Dr [name] 2004, normal renal u/s  . CAD (coronary artery disease)   . CHF (congestive heart failure) (HCC)    Thought primarily to be non-systolic although EF down (EF 41-32% 12/2010, down to 35-40% 09/05/11), cath 2008 with no CAD, nuclear study 07/2011 showing Small area of reversibility in the distal ant/lat wall the left ventricle suspicious for ischemia/septal wall HK but felt to be low risk  (per D/C Summary 07/2011)  . Chronic bronchitis (HCC)    "get it ~ q yr"  . Heart murmur   . HLD (hyperlipidemia)   . HTN (hypertension)   . ICB (intracranial bleed) (HCC) 06-2012   d/c coumadin  permanently  . Insomnia   . Migraines    "very very rare"  . On home oxygen therapy    at night as needed  . Pacemaker   . Peripheral vascular disease (HCC)    ??  . Pleural effusion 2008   S/p decortication  . Pulmonary HTN (HCC)    per cath 2008  . Type II diabetes mellitus (HCC) 1999    Past Surgical History:  Procedure Laterality Date  . BI-VENTRICULAR PACEMAKER INSERTION Left 09/08/2011   Procedure:  BI-VENTRICULAR PACEMAKER INSERTION (CRT-P);  Surgeon: Marinus Maw, MD;  Location: [facility] CATH LAB;  Service: Cardiovascular;  Laterality: Left;  . CARDIAC CATHETERIZATION     "couple times; never had balloon or stent" (09/27/2012)  . COLONOSCOPY  03/10/11   normal  . INSERT / REPLACE / REMOVE PACEMAKER  09/08/11   pacemaker placement  . LUNG DECORTICATION    . PLEURAL SCARIFICATION    . pneumothorax with fibrothorax  ~ 2010  . TONSILLECTOMY     "as a kid" (09/27/2012)     Inpatient Medications: Scheduled Meds: . amLODipine  10 mg Oral Daily  . aspirin  81 mg Oral q morning - 10a  . atorvastatin  20 mg Oral q1800  . carvedilol  37.5 mg Oral BID WC  . enoxaparin (LOVENOX) injection  40 mg Subcutaneous Q24H  . furosemide  60 mg Intravenous Q12H  . guaiFENesin  600 mg Oral BID  . insulin aspart  0-9 Units Subcutaneous TID WC  . ipratropium-albuterol  3 mL Nebulization TID  . linagliptin  5 mg Oral Daily  . losartan  100 mg Oral Daily  . methylPREDNISolone (SOLU-MEDROL) injection  40 mg Intravenous Q12H  . multivitamin with minerals  1 tablet Oral Daily  . pantoprazole  40 mg Oral q morning - 10a  . sodium chloride flush  3 mL Intravenous Q12H  . sodium chloride flush  3 mL Intravenous Q12H  . tamsulosin  0.4 mg Oral Daily   Continuous Infusions: . sodium chloride     PRN Meds: sodium chloride, acetaminophen **OR** acetaminophen, albuterol, dextromethorphan-guaiFENesin, nitroGLYCERIN, ondansetron **OR** ondansetron (ZOFRAN) IV, sodium chloride flush, zolpidem  Allergies:    Allergies  Allergen Reactions  . Hydrocodone Other (See Comments)    "given to him in the hospital; went thru withdrawals once home; dr said not to take it again" (09/27/2012)  . Tramadol Other (See Comments)    "given to him in the hospital; went thru withdrawals once home; dr said not to take it again" (09/27/2012)  . Tadalafil Other (See Comments)    headache, backache  . Avelox [Moxifloxacin  Hydrochloride] Itching and Other (See Comments)    Headache    Social History:   Social History   Social History  . Marital status: Married    Spouse name: [name]  . Number of children: 1  . Years of education: N/A   Occupational History  . 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 Smoking status: Former Smoker    Packs/day: 2.50    Years: 25.00    Types: Cigarettes    Quit date: 10/11/1980  . Smokeless tobacco: Never Used     Comment: used to smoke 2.5 ppd for 25 years  . Alcohol use No     Comment: 08/22/2014 "maybe q couple months I'll have a beer w/dinner"  . Drug use: No  . Sexual activity: Not Currently   Other Topics Concern  .  Not on file   Social History Narrative   Moved from [place] 2003.[place]   Bradley [name] lives w/ his wife, [name]   1 son, substance abuse, passed away Oct 09, 2016             Family History:   The patient's family history includes Breast cancer in his maternal aunt; Coronary artery disease in his father; Diabetes in his father; Drug abuse in his son; Polycythemia in his mother. There is no history of Prostate cancer or Colon cancer.  ROS:  Please see the history of present illness.  All other ROS reviewed and negative.     Physical Exam/Data:   Vitals:   02/21/17 2302 02/22/17 0633 02/22/17 0836 02/22/17 0903  BP: 112/67 132/78    Pulse: 79 70  65  Resp: 20 20  18   Temp: 98 F (36.7 C) 97.5 F (36.4 C)    TempSrc: Oral Oral    SpO2: 96% 99% 100% 99%  Weight:  185 lb 4.8 oz (84.1 kg)    Height:        Intake/Output Summary (Last 24 hours) at 02/22/17 0955 Last data filed at 02/21/17 2032  Gross per 24 hour  Intake              120 ml  Output             2245 ml  Net            -2125 ml   Filed Weights   02/21/17 1755 02/22/17 0633  Weight: 185 lb 12.8 oz (84.3 kg) 185 lb 4.8 oz (84.1 kg)   Body mass index is 27.36 kg/m.  General: Well developed, well nourished, in no acute distress. Head: Normocephalic,  atraumatic, sclera non-icteric, no xanthomas, nares are without discharge.  Neck: Negative for carotid bruits. JVD not elevated. Lungs: labored breathing, significant wheezing and coughing during exam. Heart: RRR with S1 S2. No murmurs, rubs, or gallops appreciated. Abdomen: Soft, non-tender, non-distended with normoactive bowel sounds. No hepatomegaly. No rebound/guarding. No obvious abdominal masses. Msk:  Strength and tone appear normal for age. Extremities: No clubbing or cyanosis. No edema.  Distal pedal pulses are 2+ and equal bilaterally. Neuro: Alert and oriented X 3. No facial asymmetry. No focal deficit. Moves all extremities spontaneously. Psych:  Responds to questions appropriately with a normal affect.  EKG:  The EKG was personally reviewed and demonstrates paced rhythm HR 127, atrial fibrillation with premature ventricular complexes and RBBB.  Relevant CV Studies: Transthoracic Echocardiography 12-2015  Study Conclusions  - Left ventricle: The cavity size was normal. Systolic function was   normal. The estimated ejection fraction was in the range of 50%   to 55%. Wall motion was normal; there were no regional wall   motion abnormalities. - Aortic valve: Valve mobility was restricted. There was mild   stenosis. Mean gradient (S): 7 mm Hg. Peak gradient (S): 15 mm   Hg. - Aortic root: The aortic root was normal in size. - Mitral valve: Calcified annulus. There was mild regurgitation. - Left atrium: The atrium was moderately dilated. - Right ventricle: The cavity size was mildly dilated. Wall   thickness was normal. - Right atrium: The atrium was mildly dilated. - Tricuspid valve: There was moderate regurgitation. - Pulmonary arteries: Systolic pressure was moderately increased.   PA peak pressure: 47 mm Hg (S). - Inferior vena cava: The vessel was normal in size. - Pericardium, extracardiac: There was no pericardial effusion.   Laboratory Data:  Chemistry Recent  Labs Lab 02/21/17 0858 02/22/17 0803  NA 140 141  K 3.8 4.5  CL 102 100*  CO2 32 32  GLUCOSE 154* 196*  BUN 12 14  CREATININE 0.80 0.82  CALCIUM 8.6* 8.8*  GFRNONAA >60 >60  GFRAA >60 >60  ANIONGAP 6 9     Recent Labs Lab 02/21/17 0858  PROT 6.7  ALBUMIN 3.6  AST 19  ALT 17  ALKPHOS 82  BILITOT 0.8   Hematology Recent Labs Lab 02/21/17 0858 02/22/17 0803  WBC 5.3 4.4  RBC 4.60 4.81  HGB 14.2 14.5  HCT 41.3 42.8  MCV 89.8 89.0  MCH 30.9 30.1  MCHC 34.4 33.9  RDW 13.7 13.7  PLT 189 208   Cardiac Enzymes Recent Labs Lab 02/21/17 0858  TROPONINI <0.03   BNP Recent Labs Lab 02/21/17 0930  BNP 461.3*     Radiology/Studies:  Dg Chest 2 View  Result Date: 02/21/2017 CLINICAL DATA:  Shortness of breath. EXAM: CHEST  2 VIEW COMPARISON:  12/18/2015 FINDINGS: Cardiac pacer noted with lead tips over the right ventricle. Cardiomegaly with interim partial clearing of bilateral from interstitial prominence. Small bilateral pleural effusions noted. No pneumothorax . IMPRESSION: Cardiac pacer stable position. Cardiomegaly with interim partial clearing of pulmonary interstitial edema. Small bilateral pleural effusions again noted. Electronically Signed   By: Maisie Fus  Register   On: 02/21/2017 09:35    Assessment and Plan:   1. Acute on chronic systolic heart failure vs  plus COPD exacerbation vs URI: The patient is coughing and wheezing significantly with fatigue. It is not straight forwards what the etiology of his symptoms are, could very well be a combination of heart failure, COPD exacerbation and infection.  Last echo March 2017 showing EF 50-55%, no regional wall motion abnormalities. I recommend rechecking Echo this admission. This admission his BNP is 491, chest xray shows bilateral pleural effusions. Bradley [name] was given a dose of IV lasix 60 mg and diuresed 2 liters overnight. I recommend continuing to diurese at 60 mg IV BID. Watch electrolytes, BP, HR and creatinine  closely. --  Strict I/O's and daily weights. --  Needs repeat echocardiogram, will discuss with Dr. Duke Salvia --  Bradley [name] denies any CP, no ischemic work-up recommended at this time.  2. Paroxysmal atrial Fibrillation: not a good candidate for anticoagulation due to intracranial head bleed in 2013. Currently rate controlled on Coreg, continue. CHADVASC score is 8, Bradley [name] is very high risk for thrombotic stroke.  3. Pacemaker: Scheduled for outpatient  Pacemaker interrogation tomorrow. Will see if this can be arranged during this admission.  4. Hypertension: Well controlled on Norvasc and Cozaar, continue.  Diabetes: Medicine to manage.   Suan Halter, PA-C  02/22/2017 9:55 AM

## 2017-02-22 NOTE — Telephone Encounter (Signed)
Confirmed remote transmission w/ pt wife. Informed her that pt can send remote transmission when he gets home from hospital.

## 2017-02-22 NOTE — Progress Notes (Signed)
Spoke with pt concerning HH. Pt declined at present time. Pt states he is on home O2 and have 2 tanks at home.

## 2017-02-23 DIAGNOSIS — E119 Type 2 diabetes mellitus without complications: Secondary | ICD-10-CM

## 2017-02-23 DIAGNOSIS — I5043 Acute on chronic combined systolic (congestive) and diastolic (congestive) heart failure: Secondary | ICD-10-CM

## 2017-02-23 LAB — CBC
HCT: 42.4 % (ref 39.0–52.0)
Hemoglobin: 14.2 g/dL (ref 13.0–17.0)
MCH: 30 pg (ref 26.0–34.0)
MCHC: 33.5 g/dL (ref 30.0–36.0)
MCV: 89.5 fL (ref 78.0–100.0)
Platelets: 241 10*3/uL (ref 150–400)
RBC: 4.74 MIL/uL (ref 4.22–5.81)
RDW: 13.7 % (ref 11.5–15.5)
WBC: 9.9 10*3/uL (ref 4.0–10.5)

## 2017-02-23 LAB — GLUCOSE, CAPILLARY
Glucose-Capillary: 264 mg/dL — ABNORMAL HIGH (ref 65–99)
Glucose-Capillary: 237 mg/dL — ABNORMAL HIGH (ref 65–99)
Glucose-Capillary: 319 mg/dL — ABNORMAL HIGH (ref 65–99)
Glucose-Capillary: 296 mg/dL — ABNORMAL HIGH (ref 65–99)

## 2017-02-23 LAB — BASIC METABOLIC PANEL
Anion gap: 6 (ref 5–15)
BUN: 20 mg/dL (ref 6–20)
CO2: 34 mmol/L — ABNORMAL HIGH (ref 22–32)
Calcium: 9 mg/dL (ref 8.9–10.3)
Chloride: 99 mmol/L — ABNORMAL LOW (ref 101–111)
Creatinine, Ser: 0.79 mg/dL (ref 0.61–1.24)
GFR calc Af Amer: >60 mL/min (ref >60)
GFR calc non Af Amer: >60 mL/min (ref >60)
Glucose, Bld: 204 mg/dL — ABNORMAL HIGH (ref 65–99)
Potassium: 4.3 mmol/L (ref 3.5–5.1)
Sodium: 139 mmol/L (ref 135–145)

## 2017-02-23 MED ORDER — PREDNISONE 50 MG PO TABS
60.0000 mg | ORAL_TABLET | Freq: Every day | ORAL | Status: DC
  Administered 2017-02-23 – 2017-02-24 (×2): 60 mg via ORAL
  Filled 2017-02-23 (×2): qty 1

## 2017-02-23 MED ORDER — TORSEMIDE 20 MG PO TABS
40.0000 mg | ORAL_TABLET | Freq: Two times a day (BID) | ORAL | Status: DC
  Administered 2017-02-23 – 2017-02-24 (×2): 40 mg via ORAL
  Filled 2017-02-23 (×3): qty 2

## 2017-02-23 NOTE — Progress Notes (Signed)
Progress Note  Patient Name: Bradley KAMEL Sr. Date of Encounter: 02/23/2017  Primary Cardiologist: New to Dr. Duke Salvia  Subjective   He is feeling a little bit better than yesterday. Still short of breath but "getting his breath back". He is concerned about getting his biv medtronic pacemaker interrogated. I have put in a request to have that done today. He has not had any CP or palpitations. He has not been up and walking around. Diuresed well overnight.  Inpatient Medications    Scheduled Meds: . amLODipine  10 mg Oral Daily  . aspirin  81 mg Oral q morning - 10a  . atorvastatin  20 mg Oral q1800  . carvedilol  37.5 mg Oral BID WC  . enoxaparin (LOVENOX) injection  40 mg Subcutaneous Q24H  . furosemide  60 mg Intravenous Q12H  . guaiFENesin  600 mg Oral BID  . insulin aspart  0-9 Units Subcutaneous TID WC  . ipratropium-albuterol  3 mL Nebulization TID  . linagliptin  5 mg Oral Daily  . losartan  100 mg Oral Daily  . methylPREDNISolone (SOLU-MEDROL) injection  40 mg Intravenous Q12H  . multivitamin with minerals  1 tablet Oral Daily  . pantoprazole  40 mg Oral q morning - 10a  . sodium chloride flush  3 mL Intravenous Q12H  . sodium chloride flush  3 mL Intravenous Q12H  . tamsulosin  0.4 mg Oral Daily   Continuous Infusions: . sodium chloride     PRN Meds: sodium chloride, acetaminophen **OR** acetaminophen, albuterol, dextromethorphan-guaiFENesin, nitroGLYCERIN, ondansetron **OR** ondansetron (ZOFRAN) IV, sodium chloride flush, zolpidem   Vital Signs    Vitals:   02/22/17 2003 02/22/17 2131 02/23/17 0536 02/23/17 0631  BP:  120/78 136/66   Pulse:  71 90   Resp:  18 20   Temp:  97.1 F (36.2 C) 97.8 F (36.6 C)   TempSrc:  Oral Oral   SpO2: 92% 94% 92%   Weight:    186 lb 6.4 oz (84.6 kg)  Height:        Intake/Output Summary (Last 24 hours) at 02/23/17 0756 Last data filed at 02/23/17 0537  Gross per 24 hour  Intake                0 ml  Output              1625 ml  Net            -1625 ml   Filed Weights   02/21/17 1755 02/22/17 0633 02/23/17 0631  Weight: 185 lb 12.8 oz (84.3 kg) 185 lb 4.8 oz (84.1 kg) 186 lb 6.4 oz (84.6 kg)    Telemetry    HR 77, ventricular paced rhythm - Personally Reviewed  ECG    Ventricular paced rhythm with bigeminy - Personally Reviewed  Physical Exam   General: Well developed, well nourished, in no acute distress. Head: Normocephalic, atraumatic, sclera non-icteric, no xanthomas, nares are without discharge.       Neck: Negative for carotid bruits. JVD not elevated. Lungs: increased effort of breathing, mild rhonchi Heart: RRR with S1 S2. No murmurs, rubs, or gallops appreciated. Abdomen: Soft, non-tender, non-distended with normoactive bowel sounds. No hepatomegaly. No rebound/guarding. No obvious abdominal masses. Msk:  Strength and tone appear normal for age. Extremities: No clubbing or cyanosis. No edema.  Distal pedal pulses are 2+ and equal bilaterally. Neuro: Alert and oriented X 3. No facial asymmetry. No focal deficit. Moves all extremities spontaneously. Psych:  Responds to questions appropriately with a normal affect.  Labs    Chemistry Recent Labs Lab 02/21/17 0858 02/22/17 0803 02/23/17 0537  NA 140 141 139  K 3.8 4.5 4.3  CL 102 100* 99*  CO2 32 32 34*  GLUCOSE 154* 196* 204*  BUN 12 14 20   CREATININE 0.80 0.82 0.79  CALCIUM 8.6* 8.8* 9.0  PROT 6.7  --   --   ALBUMIN 3.6  --   --   AST 19  --   --   ALT 17  --   --   ALKPHOS 82  --   --   BILITOT 0.8  --   --   GFRNONAA >60 >60 >60  GFRAA >60 >60 >60  ANIONGAP 6 9 6      Hematology Recent Labs Lab 02/21/17 0858 02/22/17 0803 02/23/17 0537  WBC 5.3 4.4 9.9  RBC 4.60 4.81 4.74  HGB 14.2 14.5 14.2  HCT 41.3 42.8 42.4  MCV 89.8 89.0 89.5  MCH 30.9 30.1 30.0  MCHC 34.4 33.9 33.5  RDW 13.7 13.7 13.7  PLT 189 208 241    Cardiac Enzymes Recent Labs Lab 02/21/17 0858  TROPONINI <0.03    BNP Recent  Labs Lab 02/21/17 0930  BNP 461.3*      Radiology    Dg Chest 2 View  Result Date: 02/21/2017 CLINICAL DATA:  Shortness of breath. EXAM: CHEST  2 VIEW COMPARISON:  12/18/2015 FINDINGS: Cardiac pacer noted with lead tips over the right ventricle. Cardiomegaly with interim partial clearing of bilateral from interstitial prominence. Small bilateral pleural effusions noted. No pneumothorax . IMPRESSION: Cardiac pacer stable position. Cardiomegaly with interim partial clearing of pulmonary interstitial edema. Small bilateral pleural effusions again noted. Electronically Signed   By: Maisie Fus  Register   On: 02/21/2017 09:35    Cardiac Studies   Transthoracic Echocardiography 02/2017  ------------------------------------------------------------------- Study Conclusions  - Left ventricle: The cavity size was normal. Wall thickness was   increased in a pattern of mild LVH. Systolic function was normal.   The estimated ejection fraction was in the range of 55% to 60%.   The study is not technically sufficient to allow evaluation of LV   diastolic function. - Aortic valve: Trileaflet. Sclerosis without stenosis. There was   no regurgitation. - Mitral valve: Mildly thickened leaflets . There was mild   regurgitation. - Left atrium: Moderately dilated. - Right ventricle: The cavity size was mildly dilated. Pacer wire   or catheter noted in right ventricle. Systolic function was   normal. - Right atrium: Moderately dilated. Pacer wire or catheter noted in   right atrium. - Tricuspid valve: There was moderate regurgitation. - Pulmonary arteries: PA peak pressure: 55 mm Hg (S). - Inferior vena cava: The vessel was dilated. The respirophasic   diameter changes were blunted (< 50%), consistent with elevated   central venous pressure.  Impressions:  - Compared to a prior echo in 12/2015, the LVEF is higher at 55-60%.   Pacemaker wires are noted. The RVSP has increased from 47 mmHg to    55 mmHg.  Patient Profile     Bradley BERTHELOT Sr. is a 76 y.o. male DNR with a hx of chronic atrial fibrillation, nonischemic cardiomyopathy s/p AV node ablation and biventricular pace maker insertion (2012), CAD, chronic systolic HF, unspecified heart murmur, hyperlipidemia, hypertension, intracranial bleed (2013) dc'd coumadin permanently, COPD  2 L O2, peripheral vascular disease, pulmonary hypertension dx in Cath 2008, type II diabetes who is  being seen today for the evaluation of heart failure and atrial fibrillation at the request of Dr. Sunnie Nielsen.   Assessment & Plan     Today's Weight: 186 lb Admission Weight:  185 lb Net loss: 3.750 Liters Blood Pressure: 136/66  Hr: 73 Labs:  Creatinine 0.79,  K 4.3 , Na  139 , WBC 9.9, Hbg 14.2   1. Acute on chronic diastolic heart failure:  The patient had diuresed well, down > 3.5 liters since yesterday. Feeling better overall today. Will switch his dose to oral lasix today. Lasix 60 mg BID PO.  Electrolytes, BP and Cr are stable. Continues to deny any CP. Significant improved SOB. His echocardiogram from yesterday revealed EF 55-60%, they were unable to assess diastolic function due to study not being technically sufficient. Mild LVH, they made no mention of regional wall motion abnormalities. -- Continue I/O's and daily weights -- switch to oral lasix today  2. He is ventricular paced s/p AV node ablation with PPM: not a good candidate for anticoagulation due to intracranial head bleed in 2013. Currently rate controlled on Coreg, continue. CHADVASC score is 8.   3. Hypertension: well controlled.  4. Diabetes: Medicine to manage  Signed, Dorthula Matas, PA-C  02/23/2017, 7:56 AM

## 2017-02-23 NOTE — Evaluation (Signed)
Physical Therapy Evaluation Patient Details Name: Bradley BILLE Sr. MRN: [id number] DOB: 03/31/1941 Today's Date: 02/23/2017   History of Present Illness  Pt is a 76 year old male admitted for acute on chronic hypoxic Respiratory Failure; this could be related to COPD exacerbation and or CHF.  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 All education has been completed and the patient has no further questions.  Pt mobilizing well however does qualify for supplemental oxygen during physical activity (see below).  No further follow-up Physical Therapy or equipment needs. PT is signing off. Thank you for this referral.  SATURATION QUALIFICATIONS: (This note is used to comply with regulatory documentation for home oxygen)  Patient Saturations on Room Air at Rest = 91%  Patient Saturations on Room Air while Ambulating = 86%  Patient Saturations on 2 Liters of oxygen while Ambulating = 95%  Please briefly explain why patient needs home oxygen: to maintain oxygen saturations above 88% during physical activities such as ambulation.     Follow Up Recommendations No PT follow up    Equipment Recommendations  Other (comment) (qualifies for supplemental oxygen)    Recommendations for Other Services       Precautions / Restrictions Precautions Precaution Comments: monitor sats      Mobility  Bed Mobility Overal bed mobility: Modified Independent                Transfers Overall transfer level: Needs assistance Equipment used: None Transfers: Sit to/from Stand Sit to Stand: Supervision         General transfer comment: supervision for lines  Ambulation/Gait Ambulation/Gait assistance: Supervision Ambulation Distance (Feet): 400  Feet Assistive device: None Gait Pattern/deviations: Step-through pattern;Decreased stride length     General Gait Details: SpO2 dropped to 86% on room air so 2L O2 [place] applied, pt reports mild dyspnea with ambulation improved once oxygen placed, verbal cues for pursed lip breathing  Stairs            Wheelchair Mobility    Modified Rankin (Stroke Patients Only)       Balance Overall balance assessment: No apparent balance deficits (not formally assessed) (denies falls)                                           Pertinent Vitals/Pain Pain Assessment: No/denies pain    Home Living Family/patient expects to be discharged to:: Private residence Living Arrangements: Spouse/significant other   Type of Home: House (townhome) Home Access: Level entry     Home Layout: Multi-level Home Equipment: Cane - single point Additional Comments: pt reports spouse is bedbound, another family member cares for spouse    Prior Function Level of Independence: Independent         Comments: uses O2 at night and as needed for exertional tasks stating this is typically when he leaves the house to go shopping     Hand Dominance        Extremity/Trunk Assessment        Lower Extremity Assessment Lower Extremity Assessment: Overall WFL for tasks assessed       Communication   Communication: No  difficulties  Cognition Arousal/Alertness: Awake/alert Behavior During Therapy: WFL for tasks assessed/performed Overall Cognitive Status: Within Functional Limits for tasks assessed                                        General Comments      Exercises     Assessment/Plan    PT Assessment Patent does not need any further PT services  PT Problem List         PT Treatment Interventions      PT Goals (Current goals can be found in the Care Plan section)  Acute Rehab PT Goals PT Goal Formulation: All assessment and education complete, DC  therapy    Frequency     Barriers to discharge        Co-evaluation               AM-PAC PT "6 Clicks" Daily Activity  Outcome Measure Difficulty turning over in bed (including adjusting bedclothes, sheets and blankets)?: None Difficulty moving from lying on back to sitting on the side of the bed? : None Difficulty sitting down on and standing up from a chair with arms (e.g., wheelchair, bedside commode, etc,.)?: None Help needed moving to and from a bed to chair (including a wheelchair)?: None Help needed walking in hospital room?: A Little Help needed climbing 3-5 steps with a railing? : A Little 6 Click Score: 22    End of Session Equipment Utilized During Treatment: Oxygen Activity Tolerance: Patient tolerated treatment well Patient left: in bed;with call bell/phone within reach Nurse Communication: Mobility status PT Visit Diagnosis: Difficulty in walking, not elsewhere classified (R26.2)    Time: 7824-2353 PT Time Calculation (min) (ACUTE ONLY): 14 min   Charges:   PT Evaluation $PT Eval Low Complexity: 1 Procedure     PT G CodesZenovia Jarred, PT, DPT 02/23/2017 Pager: [phone]   Maida Sale E 02/23/2017, 12:08 PM

## 2017-02-23 NOTE — Progress Notes (Signed)
Inpatient Diabetes Program Recommendations  AACE/ADA: New Consensus Statement on Inpatient Glycemic Control (2015)  Target Ranges:  Prepandial:   less than 140 mg/dL      Peak postprandial:   less than 180 mg/dL (1-2 hours)      Critically ill patients:  140 - 180 mg/dL   Lab Results  Component Value Date   GLUCAP 237 (H) 02/23/2017   HGBA1C 6.3 02/03/2017    Review of Glycemic ControlResults for DELBERT, TESTANI SR. (MRN [id number]) as of 02/23/2017 13:05  Ref. Range 02/22/2017 11:57 02/22/2017 17:23 02/22/2017 21:40 02/23/2017 08:27 02/23/2017 11:45  Glucose-Capillary Latest Ref Range: 65 - 99 mg/dL 327 (H) 614 (H) 709 (H) 264 (H) 237 (H)   Diabetes history: Type 2 diabetes Outpatient Diabetes medications: Januvia 100 mg daily Current orders for Inpatient glycemic control:  Tradjenta 5 mg daily, Novolog sensitive tid with meals  Inpatient Diabetes Program Recommendations:    While in the hospital and on steroids, consider adding Levemir 16 units daily.  Once steroids tapered, patient likely won't need basal insulin.  Thanks, Beryl Meager, RN, BC-ADM Inpatient Diabetes Coordinator Pager [phone] (8a-5p)

## 2017-02-23 NOTE — Progress Notes (Signed)
PROGRESS NOTE [facility]   RUCKER PRIDGEON Sr.   ZOX:[id number] DOB: [birth year]  DOA: 02/21/2017 PCP: [name], MD   Brief Narrative:  76 year old male with medical history significant for COPD on 2 L home, chronic diastolic heart failure, A. fib status post ablation, status post pacemaker who presented complaining of shortness of breath, but has been worsening for couple of weeks prior to admission. He also reported productive cough but denies chest pain and palpitation. In the ED was found to be hypoxemic with chest x-ray showing small bilateral pleural effusion and pulmonary edema with a BNP of 461. Patient was admitted for CHF exacerbation and cardiology was consulted.  Subjective: Patient seen and examined reportedly is doing much better. Breathing is back to baseline and is ambulating with no issues. Patient remains afebrile, denies chest pain, palpitations and dizziness.  Assessment & Plan: Acute on chronic respiratory failure - multifactorial due to COPD and CHF exacerbation - improved See below  COPD exacerbation Patient initially treated with IV steroids and nebulizers. Now transitioned to prednisone 60 mg daily will do taper for 14 days. Continue supportive treatment and O2 when necessary  Acute on chronic diastolic heart failure Patient is -3.5 L since yesterday, shortness of breath improved. Cardiology recommendations appreciated Patient switched to torsemide 40 mg twice a day. Continue to to be negative can be discharged in the morning Strict I&O's Daily weights Interpretation of pacer per cardiologist Renal function stable Low-salt diet   DM type 2  CBG slightly uncontrolled likely due to steroids Continue to monitor for now Continue SSI since CBG monitoring.  Hypertension/CAD Continue current regimen   DVT prophylaxis:  Lovenox Code Status: Full code  Family Communication: none at bedside Disposition Plan: if remains stable home in the next 24  hour  Consultants:    Cardiology  Procedures:  Echocardiogram 02/22/17          ------------------------------------------------------------------- Study Conclusions  - Left ventricle: The cavity size was normal. Wall thickness was   increased in a pattern of mild LVH. Systolic function was normal.   The estimated ejection fraction was in the range of 55% to 60%.   The study is not technically sufficient to allow evaluation of LV   diastolic function. - Aortic valve: Trileaflet. Sclerosis without stenosis. There was   no regurgitation. - Mitral valve: Mildly thickened leaflets . There was mild   regurgitation. - Left atrium: Moderately dilated. - Right ventricle: The cavity size was mildly dilated. Pacer wire   or catheter noted in right ventricle. Systolic function was   normal. - Right atrium: Moderately dilated. Pacer wire or catheter noted in   right atrium. - Tricuspid valve: There was moderate regurgitation. - Pulmonary arteries: PA peak pressure: 55 mm Hg (S). - Inferior vena cava: The vessel was dilated. The respirophasic   diameter changes were blunted (< 50%), consistent with elevated   central venous pressure.  Impressions:  - Compared to a prior echo in 12/2015, the LVEF is higher at 55-60%.   Pacemaker wires are noted. The RVSP has increased from 47 mmHg to    55 mmHg.         Antimicrobials: Anti-infectives    None        Objective: Vitals:   02/23/17 0631 02/23/17 0914 02/23/17 1405 02/23/17 1518  BP:    124/75  Pulse:  77 81 73  Resp:  18 18 (!) 22  Temp:    97.8 F (36.6 C)  TempSrc:  Oral  SpO2:  93% 95% 95%  Weight: 84.6 kg (186 lb 6.4 oz)     Height:        Intake/Output Summary (Last 24 hours) at 02/23/17 1832 Last data filed at 02/23/17 0537  Gross per 24 hour  Intake                0 ml  Output              750 ml  Net             -750 ml   Filed Weights   02/21/17 1755 02/22/17 0633 02/23/17 0631  Weight: 84.3 kg (185 lb  12.8 oz) 84.1 kg (185 lb 4.8 oz) 84.6 kg (186 lb 6.4 oz)    Examination:  General exam: Appears calm and comfortable  Respiratory system: Respiratory effort normal, mild rhonchi at the bases. No wheezing Cardiovascular system: S1 & S2 heard, RRR. No JVD, murmurs, rubs or gallops Gastrointestinal system:  abdomen soft, slightly distended, non-tenderness positive bowel movement. Central nervous system: Alert and oriented. No focal neurological deficits. Extremities: No pedal edema.  Skin: No rashes, lesions or ulcers Psychiatry: Judgement and insight appear normal. Mood & affect appropriate.    Data Reviewed: I have personally reviewed following labs and imaging studies  CBC:  Recent Labs Lab 02/21/17 0858 02/22/17 0803 02/23/17 0537  WBC 5.3 4.4 9.9  NEUTROABS 3.6  --   --   HGB 14.2 14.5 14.2  HCT 41.3 42.8 42.4  MCV 89.8 89.0 89.5  PLT 189 208 241   Basic Metabolic Panel:  Recent Labs Lab 02/21/17 0858 02/22/17 0803 02/23/17 0537  NA 140 141 139  K 3.8 4.5 4.3  CL 102 100* 99*  CO2 32 32 34*  GLUCOSE 154* 196* 204*  BUN 12 14 20   CREATININE 0.80 0.82 0.79  CALCIUM 8.6* 8.8* 9.0   GFR: Estimated Creatinine Clearance: 78.6 mL/min (by C-G formula based on SCr of 0.79 mg/dL). Liver Function Tests:  Recent Labs Lab 02/21/17 0858  AST 19  ALT 17  ALKPHOS 82  BILITOT 0.8  PROT 6.7  ALBUMIN 3.6    Recent Labs Lab 02/21/17 0858  LIPASE 23   No results for input(s): AMMONIA in the last 168 hours. Coagulation Profile: No results for input(s): INR, PROTIME in the last 168 hours. Cardiac Enzymes:  Recent Labs Lab 02/21/17 0858  TROPONINI <0.03   BNP (last 3 results) No results for input(s): PROBNP in the last 8760 hours. HbA1C: No results for input(s): HGBA1C in the last 72 hours. CBG:  Recent Labs Lab 02/22/17 1723 02/22/17 2140 02/23/17 0827 02/23/17 1145 02/23/17 1649  GLUCAP 226* 250* 264* 237* 319*   Lipid Profile: No results for  input(s): CHOL, HDL, LDLCALC, TRIG, CHOLHDL, LDLDIRECT in the last 72 hours. Thyroid Function Tests: No results for input(s): TSH, T4TOTAL, FREET4, T3FREE, THYROIDAB in the last 72 hours. Anemia Panel: No results for input(s): VITAMINB12, FOLATE, FERRITIN, TIBC, IRON, RETICCTPCT in the last 72 hours. Sepsis Labs: No results for input(s): PROCALCITON, LATICACIDVEN in the last 168 hours.  No results found for this or any previous visit (from the past 240 hour(s)).       Radiology Studies: No results found.    Scheduled Meds: . amLODipine  10 mg Oral Daily  . aspirin  81 mg Oral q morning - 10a  . atorvastatin  20 mg Oral q1800  . carvedilol  37.5 mg Oral BID WC  .  enoxaparin (LOVENOX) injection  40 mg Subcutaneous Q24H  . guaiFENesin  600 mg Oral BID  . insulin aspart  0-9 Units Subcutaneous TID WC  . ipratropium-albuterol  3 mL Nebulization TID  . linagliptin  5 mg Oral Daily  . losartan  100 mg Oral Daily  . multivitamin with minerals  1 tablet Oral Daily  . pantoprazole  40 mg Oral q morning - 10a  . predniSONE  60 mg Oral Q breakfast  . sodium chloride flush  3 mL Intravenous Q12H  . sodium chloride flush  3 mL Intravenous Q12H  . tamsulosin  0.4 mg Oral Daily  . torsemide  40 mg Oral BID   Continuous Infusions: . sodium chloride       LOS: 2 days    Latrelle Dodrill, MD Pager: Text Page via [email or web address]  [phone]  If 7PM-7AM, please contact night-coverage [email or web address] Password [facility] 02/23/2017, 6:32 PM

## 2017-02-23 NOTE — Progress Notes (Signed)
Patient ambulated with PT and oxygen saturation was checked on RA.  Patient completed almost an entire lap with Oxygen saturation remaining above 90% until the very end when it briefly dropped to 86%. 2 L/M [place] was applied and oxygen saturation increased to 95%. Patient stated he was a little SOB.  Patient states he wears oxygen 2 L at night and during the day only when he goes for long walks/shopping or feels SOB.

## 2017-02-23 NOTE — Progress Notes (Signed)
Patient had 5 beats of V.tach. Patient was sitting up in bed and stated "I feel fine." Per CMT, patient has not had any red alerts this admission. Patient is to have his pacemaker checked today per cardiology PA while inpatient. MD made aware.  Will continue to monitor.

## 2017-02-24 ENCOUNTER — Other Ambulatory Visit: Payer: Self-pay | Admitting: Emergency Medicine

## 2017-02-24 DIAGNOSIS — E1159 Type 2 diabetes mellitus with other circulatory complications: Secondary | ICD-10-CM

## 2017-02-24 DIAGNOSIS — J441 Chronic obstructive pulmonary disease with (acute) exacerbation: Secondary | ICD-10-CM

## 2017-02-24 DIAGNOSIS — I5030 Unspecified diastolic (congestive) heart failure: Secondary | ICD-10-CM

## 2017-02-24 DIAGNOSIS — I5043 Acute on chronic combined systolic (congestive) and diastolic (congestive) heart failure: Secondary | ICD-10-CM

## 2017-02-24 DIAGNOSIS — I5033 Acute on chronic diastolic (congestive) heart failure: Secondary | ICD-10-CM

## 2017-02-24 DIAGNOSIS — I509 Heart failure, unspecified: Secondary | ICD-10-CM

## 2017-02-24 LAB — BASIC METABOLIC PANEL
Anion gap: 8 (ref 5–15)
BUN: 23 mg/dL — ABNORMAL HIGH (ref 6–20)
CO2: 34 mmol/L — ABNORMAL HIGH (ref 22–32)
Calcium: 8.3 mg/dL — ABNORMAL LOW (ref 8.9–10.3)
Chloride: 97 mmol/L — ABNORMAL LOW (ref 101–111)
Creatinine, Ser: 0.87 mg/dL (ref 0.61–1.24)
GFR calc Af Amer: >60 mL/min (ref >60)
GFR calc non Af Amer: >60 mL/min (ref >60)
Glucose, Bld: 231 mg/dL — ABNORMAL HIGH (ref 65–99)
Potassium: 3.7 mmol/L (ref 3.5–5.1)
Sodium: 139 mmol/L (ref 135–145)

## 2017-02-24 LAB — GLUCOSE, CAPILLARY
Glucose-Capillary: 190 mg/dL — ABNORMAL HIGH (ref 65–99)
Glucose-Capillary: 227 mg/dL — ABNORMAL HIGH (ref 65–99)

## 2017-02-24 MED ORDER — PREDNISONE 10 MG PO TABS: ORAL_TABLET | ORAL | 0 refills | Status: DC

## 2017-02-24 MED ORDER — TORSEMIDE 20 MG PO TABS: 60.0000 mg | ORAL_TABLET | Freq: Two times a day (BID) | ORAL | 0 refills | Status: DC

## 2017-02-24 MED ORDER — BUDESONIDE-FORMOTEROL FUMARATE 160-4.5 MCG/ACT IN AERO: 2.0000 | INHALATION_SPRAY | Freq: Two times a day (BID) | RESPIRATORY_TRACT | 0 refills | Status: DC

## 2017-02-24 NOTE — Discharge Summary (Signed)
Physician Discharge Summary  Bradley MELONE Sr.  ZOX:[id number]  DOB: 1940/12/22  DOA: 02/21/2017 PCP: [name], MD  Admit date: 02/21/2017 Discharge date: 02/24/2017  Admitted From: Home  Disposition:  Home   Recommendations for Outpatient Follow-up:  1. Follow up with PCP in 1-2 weeks 2. Please obtain BMP/CBC in one week to monitor Cr and Hgb  3. Please follow up with cardiology in 2 weeks 4. 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 fib status post ablation, status post pacemaker who presented complaining of shortness of breath, but has been worsening for couple of weeks prior to admission. He also reported productive cough but denies chest pain and palpitation. In the ED was found to be hypoxemic with chest x-ray showing small bilateral pleural effusion and pulmonary edema with a BNP of 461. Patient was admitted for CHF exacerbation, started on IV lasix and cardiology was consulted. Patient also was treated for mild CPOPD exacerbation as patient was wheezing on admission. Patient clinically improved and now breathing back to baseline. Patient deemed stable for discharge.   Subjective: Patient seen and examined, has no complaints today, breathing back to baseline. Not wearing nasal canula at this time. Denies chest pain, SOB and palpitations. No acute events overnight and patient remains afebrile.   Discharge Diagnoses/Hospital Course:  Acute on chronic respiratory failure - multifactorial due to COPD and CHF exacerbation - improved See below  Acute on chronic diastolic heart failure Patient is -3.7 L during hospital stay shortness of breath resolved  Cardiology recommendations appreciated Initially treated with IV Lasix  - transitioned to torsemide PO 60 mg BID  Home lasix where discontinued  ECHO was done see results below  Daily weights Renal function stable Low-salt diet Follow up with cardiologist   COPD exacerbation - O2 dependent 2L [place] with ambulation  Patient initially treated with IV steroids and nebulizers. Subsequently transitioned to prednisone 60 mg daily, discharged on a taper for 14 days. Will send on Symbicort inhaler and albuterol PRN  Continue O2 when necessary Follow up with PCP    DM type 2  CBG slightly uncontrolled during hospital stay likely due to steroids, expect to normalize as steroids are tapered down.  Continue to monitor for now Resume home regimen  Follow up with PCP   Hypertension/CAD No changes in medications where made   All other chronic medical condition were stable during the hospitalization.  Patient was seen by physical therapy, and no recommendations where made On the day of the discharge the patient's vitals were stable, and no other acute medical condition were reported by patient. Patient was felt safe to be discharge to home   Discharge Instructions  You were cared for by a hospitalist during your hospital stay. If you have any questions about your discharge medications or the care you received while you were in the hospital after you are discharged, you can call the unit and asked to speak with the hospitalist on call if the hospitalist that took care of you is not available. Once you are discharged, your primary care physician will handle any further medical issues. Please note that NO REFILLS for any discharge medications will be authorized once you are discharged, as it is imperative that you return to  your primary care physician (or establish a relationship with a primary care physician if you do not have one) for your aftercare needs so that they can reassess your need for medications and monitor your lab values.  Discharge Instructions    Call MD  for:  difficulty breathing, headache or visual disturbances    Complete by:  As directed    Call MD for:  extreme fatigue    Complete by:  As directed    Call MD for:  hives    Complete by:  As directed    Call MD for:  persistant dizziness or light-headedness    Complete by:  As directed    Call MD for:  persistant nausea and vomiting    Complete by:  As directed    Call MD for:  redness, tenderness, or signs of infection (pain, swelling, redness, odor or green/yellow discharge around incision site)    Complete by:  As directed    Call MD for:  severe uncontrolled pain    Complete by:  As directed    Call MD for:  temperature >100.4    Complete by:  As directed    Diet - low sodium heart healthy    Complete by:  As directed    Diet Carb Modified    Complete by:  As directed    Increase activity slowly    Complete by:  As directed      Allergies as of 02/24/2017      Reactions   Hydrocodone Other (See Comments)   "given to him in the hospital; went thru withdrawals once home; dr said not to take it again" (09/27/2012)   Tramadol Other (See Comments)   "given to him in the hospital; went thru withdrawals once home; dr said not to take it again" (09/27/2012)   Tadalafil Other (See Comments)   headache, backache   Avelox [moxifloxacin Hydrochloride] Itching, Other (See Comments)   Headache      Medication List    STOP taking these medications   furosemide 40 MG tablet Commonly known as:  LASIX     TAKE these medications   albuterol 108 (90 Base) MCG/ACT inhaler Commonly known as:  PROVENTIL HFA;VENTOLIN HFA Inhale 2 puffs into the lungs every 4 (four) hours as needed for wheezing or shortness of breath.   amLODipine 10 MG tablet Commonly known as:  NORVASC Take 1 tablet (10 mg total) by mouth daily.   aspirin 81 MG tablet Take 81 mg by mouth every morning.   atorvastatin 20 MG tablet Commonly known as:  LIPITOR Take 20 mg by mouth at bedtime.    budesonide-formoterol 160-4.5 MCG/ACT inhaler Commonly known as:  SYMBICORT Inhale 2 puffs into the lungs 2 (two) times daily.   calcium-vitamin D 500-200 MG-UNIT tablet Commonly known as:  OSCAL WITH D Take 1 tablet by mouth every morning.   carvedilol 25 MG tablet Commonly known as:  COREG Take 1.5 tablets (37.5 mg total) by mouth 2 (two) times daily with a meal.   losartan 100 MG tablet Commonly known as:  COZAAR Take 1 tablet (100 mg total) by mouth daily.   multivitamins ther. w/minerals Tabs tablet Take 1 tablet by mouth daily.   nitroGLYCERIN 0.4 MG SL tablet Commonly known as:  NITROSTAT DISSOLVE 1 TABLET UNDER THE TONGUE EVERY 5 MINUTES AS NEEDED FOR CHEST PAIN   ONE TOUCH ULTRA TEST test strip Generic drug:  glucose blood USE TO CHECK BLOOD SUGAR NO MORE THAN  TWICE A DAY   pantoprazole 40 MG tablet Commonly known as:  PROTONIX TAKE 1 TABLET EVERY MORNING   predniSONE 10 MG tablet Commonly known as:  DELTASONE Take 4 tablets for 3 days; Take 3 tablets for 4 days; Take 2 tablets for 3 days; Take 1 tablet for 4 days   sitaGLIPtin 100 MG tablet Commonly known as:  JANUVIA Take 1 tablet (100 mg total) by mouth daily.   tamsulosin 0.4 MG Caps capsule Commonly known as:  FLOMAX Take 1 capsule (0.4 mg total) by mouth daily.   torsemide 20 MG tablet Commonly known as:  DEMADEX Take 3 tablets (60 mg total) by mouth 2 (two) times daily.   TYLENOL PO Take 1 tablet by mouth 2 (two) times daily as needed (pain).   zolpidem 10 MG tablet Commonly known as:  AMBIEN Take 1 tablet (10 mg total) by mouth at bedtime as needed for sleep.      Follow-up Information    [name], MD. Schedule an appointment as soon as possible for a visit in 1 week(s).   Specialty:  Internal Medicine Contact information: [address] [place] [address] [phone]        Marinus Maw, MD. Schedule an appointment as soon as possible for a visit in 2  week(s).   Specialty:  Cardiology Contact information: [address] [place] [address] [phone]          Allergies  Allergen Reactions  . Hydrocodone Other (See Comments)    "given to him in the hospital; went thru withdrawals once home; dr said not to take it again" (09/27/2012)  . Tramadol Other (See Comments)    "given to him in the hospital; went thru withdrawals once home; dr said not to take it again" (09/27/2012)  . Tadalafil Other (See Comments)    headache, backache  . Avelox [Moxifloxacin Hydrochloride] Itching and Other (See Comments)    Headache    Consultations:  Cardiology    Procedures/Studies: Dg Chest 2 View  Result Date: 02/21/2017 CLINICAL DATA:  Shortness of breath. EXAM: CHEST  2 VIEW COMPARISON:  12/18/2015 FINDINGS: Cardiac pacer noted with lead tips over the right ventricle. Cardiomegaly with interim partial clearing of bilateral from interstitial prominence. Small bilateral pleural effusions noted. No pneumothorax . IMPRESSION: Cardiac pacer stable position. Cardiomegaly with interim partial clearing of pulmonary interstitial edema. Small bilateral pleural effusions again noted. Electronically Signed   By: Maisie Fus  Register   On: 02/21/2017 09:35   ECHO 02/22/17 ------------------------------------------------------------------- Study Conclusions  - Left ventricle: The cavity size was normal. Wall thickness was   increased in a pattern of mild LVH. Systolic function was normal.   The estimated ejection fraction was in the range of 55% to 60%.   The study is not technically sufficient to allow evaluation of LV   diastolic function. - Aortic valve: Trileaflet. Sclerosis without stenosis. There was   no regurgitation. - Mitral valve: Mildly thickened leaflets . There was mild   regurgitation. - Left atrium: Moderately dilated. - Right ventricle: The cavity size was mildly dilated. Pacer wire   or catheter noted in right  ventricle. Systolic function was   normal. - Right atrium: Moderately dilated. Pacer wire or catheter noted in   right atrium. - Tricuspid valve: There was moderate regurgitation. - Pulmonary arteries: PA peak pressure: 55 mm Hg (S). - Inferior vena cava: The vessel was dilated. The respirophasic   diameter  changes were blunted (< 50%), consistent with elevated   central venous pressure.  Impressions:  - Compared to a prior echo in 12/2015, the LVEF is higher at 55-60%.   Pacemaker wires are noted. The RVSP has increased from 47 mmHg to   55 mmHg.  Discharge Exam: Vitals:   02/24/17 0608 02/24/17 0930  BP: 118/75 115/72  Pulse: 74   Resp: 20   Temp: 97.7 F (36.5 C)    Vitals:   02/23/17 2115 02/24/17 0547 02/24/17 0608 02/24/17 0930  BP: 110/64  118/75 115/72  Pulse: 68  74   Resp: 20  20   Temp: 97.9 F (36.6 C)  97.7 F (36.5 C)   TempSrc: Oral  Oral   SpO2: 95% 90% 93%   Weight:   85 kg (187 lb 8 oz)   Height:        General: Pt is alert, awake, not in acute distress Cardiovascular: RRR, S1/S2 +, no rubs, no gallops Respiratory: CTA bilaterally, no wheezing, no rhonchi Abdominal: Soft, NT, ND, bowel sounds + Extremities: no edema, no cyanosis   The results of significant diagnostics from this hospitalization (including imaging, microbiology, ancillary and laboratory) are listed below for reference.    Labs: BNP (last 3 results)  Recent Labs  02/21/17 0930  BNP 461.3*   Basic Metabolic Panel:  Recent Labs Lab 02/21/17 0858 02/22/17 0803 02/23/17 0537 02/24/17 1044  NA 140 141 139 139  K 3.8 4.5 4.3 3.7  CL 102 100* 99* 97*  CO2 32 32 34* 34*  GLUCOSE 154* 196* 204* 231*  BUN 12 14 20  23*  CREATININE 0.80 0.82 0.79 0.87  CALCIUM 8.6* 8.8* 9.0 8.3*   Liver Function Tests:  Recent Labs Lab 02/21/17 0858  AST 19  ALT 17  ALKPHOS 82  BILITOT 0.8  PROT 6.7  ALBUMIN 3.6    Recent Labs Lab 02/21/17 0858  LIPASE 23   No results for  input(s): AMMONIA in the last 168 hours. CBC:  Recent Labs Lab 02/21/17 0858 02/22/17 0803 02/23/17 0537  WBC 5.3 4.4 9.9  NEUTROABS 3.6  --   --   HGB 14.2 14.5 14.2  HCT 41.3 42.8 42.4  MCV 89.8 89.0 89.5  PLT 189 208 241   Cardiac Enzymes:  Recent Labs Lab 02/21/17 0858  TROPONINI <0.03   BNP: Invalid input(s): POCBNP CBG:  Recent Labs Lab 02/23/17 0827 02/23/17 1145 02/23/17 1649 02/23/17 2113 02/24/17 0750  GLUCAP 264* 237* 319* 296* 190*    Urinalysis    Component Value Date/Time   COLORURINE YELLOW 07/15/2015 1440   APPEARANCEUR CLEAR 07/15/2015 1440   LABSPEC 1.015 07/15/2015 1440   PHURINE 6.0 07/15/2015 1440   GLUCOSEU NEGATIVE 07/15/2015 1440   HGBUR TRACE-LYSED (A) 07/15/2015 1440   BILIRUBINUR NEGATIVE 07/15/2015 1440   KETONESUR TRACE (A) 07/15/2015 1440   PROTEINUR NEGATIVE 07/06/2012 0341   UROBILINOGEN 1.0 07/15/2015 1440   NITRITE NEGATIVE 07/15/2015 1440   LEUKOCYTESUR NEGATIVE 07/15/2015 1440   Sepsis Labs Invalid input(s): PROCALCITONIN,  WBC,  LACTICIDVEN Microbiology No results found for this or any previous visit (from the past 240 hour(s)).   Time coordinating discharge: 38 minutes  SIGNED:  Latrelle Dodrill, MD  [facility] 02/24/2017, 12:17 PM  Pager please text page via  [email or web address] Password [email or web address]

## 2017-02-24 NOTE — Progress Notes (Signed)
Progress Note  Patient Name: Bradley GALENTINE Sr. Date of Encounter: 02/24/2017  Primary Cardiologist: new to Dr. Duke Salvia Primary Electrophysiologist:  Lewayne Bunting, MD (last seen in 2017)  Subjective   He reports his breathing is at baseline and he is feeling better. He is hoping to go home today. He will have his pacemaker check done remotely from home after discharge.  Inpatient Medications    Scheduled Meds: . amLODipine  10 mg Oral Daily  . aspirin  81 mg Oral q morning - 10a  . atorvastatin  20 mg Oral q1800  . carvedilol  37.5 mg Oral BID WC  . enoxaparin (LOVENOX) injection  40 mg Subcutaneous Q24H  . guaiFENesin  600 mg Oral BID  . insulin aspart  0-9 Units Subcutaneous TID WC  . ipratropium-albuterol  3 mL Nebulization TID  . linagliptin  5 mg Oral Daily  . losartan  100 mg Oral Daily  . multivitamin with minerals  1 tablet Oral Daily  . pantoprazole  40 mg Oral q morning - 10a  . predniSONE  60 mg Oral Q breakfast  . sodium chloride flush  3 mL Intravenous Q12H  . sodium chloride flush  3 mL Intravenous Q12H  . tamsulosin  0.4 mg Oral Daily  . torsemide  40 mg Oral BID   Continuous Infusions: . sodium chloride     PRN Meds: sodium chloride, acetaminophen **OR** acetaminophen, albuterol, dextromethorphan-guaiFENesin, nitroGLYCERIN, ondansetron **OR** ondansetron (ZOFRAN) IV, sodium chloride flush, zolpidem   Vital Signs    Vitals:   02/23/17 2115 02/24/17 0547 02/24/17 0608 02/24/17 0930  BP: 110/64  118/75 115/72  Pulse: 68  74   Resp: 20  20   Temp: 97.9 F (36.6 C)  97.7 F (36.5 C)   TempSrc: Oral  Oral   SpO2: 95% 90% 93%   Weight:   187 lb 8 oz (85 kg)   Height:        Intake/Output Summary (Last 24 hours) at 02/24/17 0950 Last data filed at 02/24/17 0616  Gross per 24 hour  Intake              120 ml  Output                0 ml  Net              120 ml   Filed Weights   02/22/17 0633 02/23/17 0631 02/24/17 0608  Weight: 185 lb 4.8 oz  (84.1 kg) 186 lb 6.4 oz (84.6 kg) 187 lb 8 oz (85 kg)    Telemetry    Ventricular paced rhythm, HR 70s- Personally Reviewed   Physical Exam   GEN: Well nourished, well developed HEENT: normal  Neck: no JVD, carotid bruits, or masses Cardiac: RRR. no murmurs, rubs, or gallops,no edema. Intact distal pulses bilaterally.  Respiratory: clear to auscultation bilaterally, improved work of breathing GI: soft, nontender, + BS, + abdominal distention MS: no deformity or atrophy  Skin: warm and dry, no rash Neuro: Alert and Oriented x 3, Strength and sensation are intact Psych:   Full affect  Labs    Chemistry Recent Labs Lab 02/21/17 0858 02/22/17 0803 02/23/17 0537  NA 140 141 139  K 3.8 4.5 4.3  CL 102 100* 99*  CO2 32 32 34*  GLUCOSE 154* 196* 204*  BUN 12 14 20   CREATININE 0.80 0.82 0.79  CALCIUM 8.6* 8.8* 9.0  PROT 6.7  --   --  ALBUMIN 3.6  --   --   AST 19  --   --   ALT 17  --   --   ALKPHOS 82  --   --   BILITOT 0.8  --   --   GFRNONAA >60 >60 >60  GFRAA >60 >60 >60  ANIONGAP 6 9 6      Hematology Recent Labs Lab 02/21/17 0858 02/22/17 0803 02/23/17 0537  WBC 5.3 4.4 9.9  RBC 4.60 4.81 4.74  HGB 14.2 14.5 14.2  HCT 41.3 42.8 42.4  MCV 89.8 89.0 89.5  MCH 30.9 30.1 30.0  MCHC 34.4 33.9 33.5  RDW 13.7 13.7 13.7  PLT 189 208 241    Cardiac Enzymes Recent Labs Lab 02/21/17 0858  TROPONINI <0.03   No results for input(s): TROPIPOC in the last 168 hours.   BNP Recent Labs Lab 02/21/17 0930  BNP 461.3*      Radiology    No results found.  Cardiac Studies   Transthoracic Echocardiography 02/2017  ------------------------------------------------------------------- Study Conclusions  - Left ventricle: The cavity size was normal. Wall thickness was increased in a pattern of mild LVH. Systolic function was normal. The estimated ejection fraction was in the range of 55% to 60%. The study is not technically sufficient to allow  evaluation of LV diastolic function. - Aortic valve: Trileaflet. Sclerosis without stenosis. There was no regurgitation. - Mitral valve: Mildly thickened leaflets . There was mild regurgitation. - Left atrium: Moderately dilated. - Right ventricle: The cavity size was mildly dilated. Pacer wire or catheter noted in right ventricle. Systolic function was normal. - Right atrium: Moderately dilated. Pacer wire or catheter noted in right atrium. - Tricuspid valve: There was moderate regurgitation. - Pulmonary arteries: PA peak pressure: 55 mm Hg (S). - Inferior vena cava: The vessel was dilated. The respirophasic diameter changes were blunted (<50%), consistent with elevated central venous pressure.  Impressions:  - Compared to a prior echo in 12/2015, the LVEF is higher at 55-60%. Pacemaker wires are noted. The RVSP has increased from 47 mmHg to 55 mmHg.  Patient Profile     Bradley Wagneris a 76 y.o.maleDNR with a hx of chronic atrial fibrillation, nonischemic cardiomyopathy s/p AV node ablation and biventricular pace maker insertion (2012), CAD, chronic systolic HF, unspecified heart murmur, hyperlipidemia, hypertension, intracranial bleed (2013) dc'd coumadin permanently, COPD 2 L O2, peripheral vascular disease, pulmonary hypertension dx in Cath 2008, type II diabetes who is being seen for the evaluation of heart failure and atrial fibrillation atthe request of Dr. Sunnie Nielsen.   Assessment & Plan    Net loss since Admission: - 3.630 liters Blood Pressure: 115/72            Hr: 76 Labs:  No labs ordered for today Today's Weight: 185 lb        Admission Weight:  187 lb   1. Acute on chronic diastolic heart failure: Diureses has slowed on oral torsemide 40 mg bid a repeat BMP had not been ordered yet for today. Order placed for STAT bmp. Yesterday BUN and CO2 were rising. Echocardiogram from this admission revealed EF 55-60%, they were unable to assess  diastolic function due to study not being technically sufficient. Mild LVH, they made no mention of regional wall motion abnormalities. -- Continue I/O's and daily weights --  Will need to assess BMP when it results for further planning on diuresis and discharge planning.  2. He is ventricular paced s/p AV node ablation with  PPM: not a good candidate for anticoagulation due to intracranial head bleed in 2013. Currently rate controlled on Coreg, continue. CHADVASC score is 8.  3. Hypertension: well controlled.  4. Diabetes: Medicine to manage  Signed, Dorthula Matas, PA-C  02/24/2017, 9:50 AM

## 2017-02-25 ENCOUNTER — Telehealth: Payer: Self-pay | Admitting: Behavioral Health

## 2017-02-25 ENCOUNTER — Encounter: Payer: Self-pay | Admitting: Cardiology

## 2017-02-25 NOTE — Telephone Encounter (Signed)
Transition Care Management Follow-up Telephone Call  Admit date: 02/21/2017 Discharge date: 02/24/2017  Admitted From: Home  Disposition:  Home   Recommendations for Outpatient Follow-up:  1. Follow up with PCP in 1-2 weeks 2. Please obtain BMP/CBC in one week to monitor Cr and Hgb  3. Please follow up with cardiology in 2 weeks 4. Interrogate pace maker   Home Health: Non Equipment/Devices: O2 2L [place]   Discharge Condition: Stable    How have you been since you were released from the hospital? Patient stated, "I feel much better".   Do you understand why you were in the hospital? yes   Do you understand the discharge instructions? yes   Where were you discharged to? Home with spouse.   Items Reviewed:  Medications reviewed: yes  Allergies reviewed: yes  Dietary changes reviewed: yes, heart healthy, carb diet  Referrals reviewed: yes, Follow up with PCP in 1-2 weeks; follow up with cardiology in 2 weeks   Functional Questionnaire:   Activities of Daily Living (ADLs):   He states they are independent in the following: ambulation, bathing and hygiene, feeding, continence, grooming, toileting and dressing States they require assistance with the following: None   Any transportation issues/concerns?: no   Any patient concerns? no   Confirmed importance and date/time of follow-up visits scheduled yes, 03/02/17 at 11:15 AM  Provider Appointment booked with [name].  Confirmed with patient if condition begins to worsen call PCP or go to the ER.  Patient was given the office number and encouraged to call back with question or concerns.  : yes

## 2017-02-25 NOTE — Progress Notes (Signed)
Remote pacemaker transmission.   

## 2017-03-02 ENCOUNTER — Encounter: Payer: Self-pay | Admitting: Cardiology

## 2017-03-02 ENCOUNTER — Inpatient Hospital Stay: Payer: Medicare Other | Admitting: Internal Medicine

## 2017-03-08 ENCOUNTER — Encounter: Payer: Self-pay | Admitting: Internal Medicine

## 2017-03-08 ENCOUNTER — Ambulatory Visit (INDEPENDENT_AMBULATORY_CARE_PROVIDER_SITE_OTHER): Payer: Medicare Other | Admitting: Internal Medicine

## 2017-03-08 VITALS — BP 126/68 | HR 79 | Temp 97.9°F | Resp 16 | Ht 69.0 in | Wt 184.4 lb

## 2017-03-08 DIAGNOSIS — J441 Chronic obstructive pulmonary disease with (acute) exacerbation: Secondary | ICD-10-CM | POA: Diagnosis not present

## 2017-03-08 DIAGNOSIS — I5033 Acute on chronic diastolic (congestive) heart failure: Secondary | ICD-10-CM | POA: Diagnosis not present

## 2017-03-08 DIAGNOSIS — E1159 Type 2 diabetes mellitus with other circulatory complications: Secondary | ICD-10-CM

## 2017-03-08 LAB — CUP PACEART REMOTE DEVICE CHECK
Battery Remaining Longevity: 30 mo
Battery Voltage: 2.96 V
Brady Statistic AP VP Percent: 0 %
Brady Statistic AP VS Percent: 0 %
Brady Statistic AS VP Percent: 95.23 %
Brady Statistic AS VS Percent: 4.77 %
Brady Statistic RA Percent Paced: 0 %
Brady Statistic RV Percent Paced: 98.64 %
Date Time Interrogation Session: 20180518165707
Implantable Lead Implant Date: 20121128
Implantable Lead Implant Date: 20121128
Implantable Lead Location: 753858
Implantable Lead Location: 753860
Implantable Lead Model: 4194
Implantable Lead Model: 5076
Implantable Pulse Generator Implant Date: 20121128
Lead Channel Impedance Value: 304 Ohm
Lead Channel Impedance Value: 4047 Ohm
Lead Channel Impedance Value: 4047 Ohm
Lead Channel Impedance Value: 418 Ohm
Lead Channel Impedance Value: 456 Ohm
Lead Channel Impedance Value: 456 Ohm
Lead Channel Impedance Value: 532 Ohm
Lead Channel Impedance Value: 551 Ohm
Lead Channel Impedance Value: 570 Ohm
Lead Channel Pacing Threshold Amplitude: 0.625 V
Lead Channel Pacing Threshold Amplitude: 0.875 V
Lead Channel Pacing Threshold Pulse Width: 0.4 ms
Lead Channel Pacing Threshold Pulse Width: 0.4 ms
Lead Channel Sensing Intrinsic Amplitude: 5.125 mV
Lead Channel Sensing Intrinsic Amplitude: 5.125 mV
Lead Channel Setting Pacing Amplitude: 2 V
Lead Channel Setting Pacing Amplitude: 5.5 V
Lead Channel Setting Pacing Pulse Width: 0.4 ms
Lead Channel Setting Pacing Pulse Width: 0.4 ms
Lead Channel Setting Sensing Sensitivity: 4 mV

## 2017-03-08 LAB — CBC WITH DIFFERENTIAL/PLATELET
Basophils Absolute: 0 10*3/uL (ref 0.0–0.1)
Basophils Relative: 0.2 % (ref 0.0–3.0)
Eosinophils Absolute: 0.1 10*3/uL (ref 0.0–0.7)
Eosinophils Relative: 1 % (ref 0.0–5.0)
HCT: 42.6 % (ref 39.0–52.0)
Hemoglobin: 14.1 g/dL (ref 13.0–17.0)
Lymphocytes Relative: 18.3 % (ref 12.0–46.0)
Lymphs Abs: 2.4 10*3/uL (ref 0.7–4.0)
MCHC: 33.2 g/dL (ref 30.0–36.0)
MCV: 90.2 fl (ref 78.0–100.0)
Monocytes Absolute: 1 10*3/uL (ref 0.1–1.0)
Monocytes Relative: 7.8 % (ref 3.0–12.0)
Neutro Abs: 9.4 10*3/uL — ABNORMAL HIGH (ref 1.4–7.7)
Neutrophils Relative %: 72.7 % (ref 43.0–77.0)
Platelets: 182 10*3/uL (ref 150.0–400.0)
RBC: 4.72 Mil/uL (ref 4.22–5.81)
RDW: 15 % (ref 11.5–15.5)
WBC: 13 10*3/uL — ABNORMAL HIGH (ref 4.0–10.5)

## 2017-03-08 LAB — BASIC METABOLIC PANEL
BUN: 28 mg/dL — ABNORMAL HIGH (ref 6–23)
CO2: 35 mEq/L — ABNORMAL HIGH (ref 19–32)
Calcium: 9.1 mg/dL (ref 8.4–10.5)
Chloride: 98 mEq/L (ref 96–112)
Creatinine, Ser: 0.96 mg/dL (ref 0.40–1.50)
GFR: 80.93 mL/min (ref >60.00)
Glucose, Bld: 138 mg/dL — ABNORMAL HIGH (ref 70–99)
Potassium: 3.9 mEq/L (ref 3.5–5.1)
Sodium: 139 mEq/L (ref 135–145)

## 2017-03-08 MED ORDER — ONETOUCH ULTRASOFT LANCETS MISC: 12 refills | Status: DC

## 2017-03-08 MED ORDER — GLUCOSE BLOOD VI STRP: ORAL_STRIP | 12 refills | Status: DC

## 2017-03-08 NOTE — Progress Notes (Signed)
Subjective:    Patient ID: Bradley Sit., male    DOB: Apr 14, 1941, 76 y.o.   MRN: [id number]  DOS:  03/08/2017 Type of visit - description : TCM 14 Interval history: Admitted to the hospital and discharge 02/24/2017: Was admitted with shortness or breath for 2 weeks, was hypoxemic at the ER, chest x-ray show bilateral pleural effusions and pulmonary edema, BNP 461. During the hospital stay, he was diuresed -3.7 L. Symptoms improved. Initially was also treated for a COPD exacerbation with IV steroids, transitioned to prednisone They recommended a BMP CBC.   Wt Readings from Last 3 Encounters:  03/08/17 184 lb 6 oz (83.6 kg)  02/24/17 187 lb 8 oz (85 kg)  02/02/17 197 lb 2 oz (89.4 kg)      Review of Systems Since he left the hospital is feeling very well. Blood sugars were in the 200s while on prednisone but this morning blood sugar was 140. Ambulatory BPs around 125/77. Weight per his own scales is around 210. Not worse since he left the hospital Denies chest pain, difficulty breathing or edema No nausea, vomiting, diarrhea. No cough or sputum production  Past Medical History:  Diagnosis Date  . Abnormal CT scan, chest 2012   CT chest, several lymphadenopathies. Sees pulmonary  . Anemia    intermittent  . Arthritis    "?back" (08/22/2014)  . Atrial fibrillation (HCC)    s/p AV node ablation & BiV PPM implantation 09/08/11 (op dictation pending)  . BPH (benign prostatic hyperplasia)    Saw Dr [name] 2004, normal renal u/s  . CAD (coronary artery disease)   . CHF (congestive heart failure) (HCC)    Thought primarily to be non-systolic although EF down (EF 03-88% 12/2010, down to 35-40% 09/05/11), cath 2008 with no CAD, nuclear study 07/2011 showing Small area of reversibility in the distal ant/lat wall the left ventricle suspicious for ischemia/septal wall HK but felt to be low risk  (per D/C Summary 07/2011)  . Chronic bronchitis (HCC)    "get it ~ q yr"  . Heart  murmur   . HLD (hyperlipidemia)   . HTN (hypertension)   . 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 Insomnia   . Migraines    "very very rare"  . On home oxygen therapy    at night as needed  . Pacemaker   . Peripheral vascular disease (HCC)    ??  . Pleural effusion 2008   S/p decortication  . Pulmonary HTN (HCC)    per cath 2008  . Type II diabetes mellitus (HCC) 1999    Past Surgical History:  Procedure Laterality Date  . BI-VENTRICULAR PACEMAKER INSERTION Left 09/08/2011   Procedure: BI-VENTRICULAR PACEMAKER INSERTION (CRT-P);  Surgeon: Marinus Maw, MD;  Location: [facility] CATH LAB;  Service: Cardiovascular;  Laterality: Left;  . CARDIAC CATHETERIZATION     "couple times; never had balloon or stent" (09/27/2012)  . COLONOSCOPY  03/10/11   normal  . INSERT / REPLACE / REMOVE PACEMAKER  09/08/11   pacemaker placement  . LUNG DECORTICATION    . PLEURAL SCARIFICATION    . pneumothorax with fibrothorax  ~ 2010  . TONSILLECTOMY     "as a kid" (09/27/2012)    Social History   Social History  . Marital status: Married    Spouse name: Bradley [name]  . Number of children: 1  . Years of education: N/A   Occupational History  . retired  truck driver Retired   Social History Main Topics  . Smoking status: Former Smoker    Packs/day: 2.50    Years: 25.00    Types: Cigarettes    Quit date: 10/11/1980  . Smokeless tobacco: Never Used     Comment: used to smoke 2.5 ppd for 25 years  . Alcohol use No     Comment: 08/22/2014 "maybe q couple months I'll have a beer w/dinner"  . Drug use: No  . Sexual activity: Not Currently   Other Topics Concern  . Not on file   Social History Narrative   Moved from [place] 2003.[place]   He lives w/ his wife, Bradley [name]   1 son, substance abuse, passed away 20-Sep-2016               Allergies as of 03/08/2017      Reactions   Hydrocodone Other (See Comments)   "given to him in the hospital; went thru withdrawals once  home; dr said not to take it again" (09/27/2012)   Tramadol Other (See Comments)   "given to him in the hospital; went thru withdrawals once home; dr said not to take it again" (09/27/2012)   Tadalafil Other (See Comments)   headache, backache   Avelox [moxifloxacin Hydrochloride] Itching, Other (See Comments)   Headache      Medication List       Accurate as of 03/08/17  3:29 PM. Always use your most recent med list.          albuterol 108 (90 Base) MCG/ACT inhaler Commonly known as:  PROVENTIL HFA;VENTOLIN HFA Inhale 2 puffs into the lungs every 4 (four) hours as needed for wheezing or shortness of breath.   amLODipine 10 MG tablet Commonly known as:  NORVASC Take 1 tablet (10 mg total) by mouth daily.   aspirin 81 MG tablet Take 81 mg by mouth every morning.   atorvastatin 20 MG tablet Commonly known as:  LIPITOR Take 20 mg by mouth at bedtime.   budesonide-formoterol 160-4.5 MCG/ACT inhaler Commonly known as:  SYMBICORT Inhale 2 puffs into the lungs 2 (two) times daily.   calcium-vitamin D 500-200 MG-UNIT tablet Commonly known as:  OSCAL WITH D Take 1 tablet by mouth every morning.   carvedilol 25 MG tablet Commonly known as:  COREG Take 1.5 tablets (37.5 mg total) by mouth 2 (two) times daily with a meal.   glucose blood test strip Check blood sugar no more than twice daily   losartan 100 MG tablet Commonly known as:  COZAAR Take 1 tablet (100 mg total) by mouth daily.   multivitamins ther. w/minerals Tabs tablet Take 1 tablet by mouth daily.   nitroGLYCERIN 0.4 MG SL tablet Commonly known as:  NITROSTAT DISSOLVE 1 TABLET UNDER THE TONGUE EVERY 5 MINUTES AS NEEDED FOR CHEST PAIN   onetouch ultrasoft lancets Check blood sugars no more than twice daily   pantoprazole 40 MG tablet Commonly known as:  PROTONIX TAKE 1 TABLET EVERY MORNING   sitaGLIPtin 100 MG tablet Commonly known as:  JANUVIA Take 1 tablet (100 mg total) by mouth daily.     tamsulosin 0.4 MG Caps capsule Commonly known as:  FLOMAX Take 1 capsule (0.4 mg total) by mouth daily.   torsemide 20 MG tablet Commonly known as:  DEMADEX Take 3 tablets (60 mg total) by mouth 2 (two) times daily.   TYLENOL PO Take 1 tablet by mouth 2 (two) times daily as needed (pain).   zolpidem 10 MG  tablet Commonly known as:  AMBIEN Take 1 tablet (10 mg total) by mouth at bedtime as needed for sleep.          Objective:   Physical Exam BP 126/68 (BP Location: Left Arm, Patient Position: Sitting, Cuff Size: Normal)   Pulse 79   Temp 97.9 F (36.6 C) (Oral)   Resp 16   Ht 5\' 9"  (1.753 m)   Wt 184 lb 6 oz (83.6 kg)   SpO2 91%   BMI 27.23 kg/m  General:   Well developed, well nourished . NAD.  HEENT:  Normocephalic . Face symmetric, atraumatic Neck: No JVD at 45  Lungs:  Decreased breath sounds but clear Normal respiratory effort, no intercostal retractions, no accessory muscle use. Heart: RRR,  no murmur.  No pretibial edema bilaterally  Skin: Not pale. Not jaundice Neurologic:  alert & oriented X3.  Speech normal, gait appropriate for age and unassisted Psych--  Cognition and judgment appear intact.  Cooperative with normal attention span and concentration.  Behavior appropriate. No anxious or depressed appearing.      Assessment & Plan:   Assessment  DM HTN Hyperlipidemia Anxiety- insomnia  BPH Cardiovascular:Dr Ladona Ridgel  --CAD -- CHF --Atrial fibrillation;  --Pacemaker --h/o IC bleeding: Not anticoagulated --Peripheral vascular disease? Pulmonary: Dr [name], last OV 10-18-2014, f/u prn --COPD, severe, chronic resp failure, night O2 prn, has portable O2  --Pleural effusion s/p decortication --Pulmonary hypertension  Stasis dermatitis   PLAN: CHF exacerbation: Admitted with CHF exacerbation, diuresed ~ 3.7 Lt. good compliance of medication, weight home stable. Echocardiogram during admission: EF 55%. We'll continue with the same medications,  low salt diet, weight himself daily. Check a CMP and CBC. Has a follow-up with cardiology tomorrow. COPD exacerbation? Was admitted to the hospital with shortness of breath, thought to be from CHF but also from COPD exacerbation. Breathing is now very good. DM: CBGs transiently elevated when he was taking the steroids for COPD.  CBGs are now  better RTC 4 months.

## 2017-03-08 NOTE — Assessment & Plan Note (Signed)
CHF exacerbation: Admitted with CHF exacerbation, diuresed ~ 3.7 Lt. good compliance of medication, weight home stable. Echocardiogram during admission: EF 55%. We'll continue with the same medications, low salt diet, weight himself daily. Check a CMP and CBC. Has a follow-up with cardiology tomorrow. COPD exacerbation? Was admitted to the hospital with shortness of breath, thought to be from CHF but also from COPD exacerbation. Breathing is now very good. DM: CBGs transiently elevated when he was taking the steroids for COPD.  CBGs are now  better RTC 4 months.

## 2017-03-08 NOTE — Patient Instructions (Signed)
GO TO THE LAB : Get the blood work     GO TO THE FRONT DESK Schedule your next appointment for a  checkup in about 4 months   Watch your salt intake  Weight yourself daily, if your weight is going up more than 5 or 6 pounds:  please contact cardiology or me.

## 2017-03-08 NOTE — Progress Notes (Signed)
Pre visit review using our clinic review tool, if applicable. No additional management support is needed unless otherwise documented below in the visit note. 

## 2017-03-09 ENCOUNTER — Encounter: Payer: Self-pay | Admitting: Physician Assistant

## 2017-03-09 ENCOUNTER — Ambulatory Visit (INDEPENDENT_AMBULATORY_CARE_PROVIDER_SITE_OTHER): Payer: Medicare Other | Admitting: Physician Assistant

## 2017-03-09 VITALS — BP 102/62 | HR 82 | Ht 69.0 in | Wt 183.6 lb

## 2017-03-09 DIAGNOSIS — E785 Hyperlipidemia, unspecified: Secondary | ICD-10-CM

## 2017-03-09 DIAGNOSIS — I482 Chronic atrial fibrillation, unspecified: Secondary | ICD-10-CM

## 2017-03-09 DIAGNOSIS — I5032 Chronic diastolic (congestive) heart failure: Secondary | ICD-10-CM | POA: Diagnosis not present

## 2017-03-09 DIAGNOSIS — Z950 Presence of cardiac pacemaker: Secondary | ICD-10-CM | POA: Diagnosis not present

## 2017-03-09 DIAGNOSIS — I10 Essential (primary) hypertension: Secondary | ICD-10-CM | POA: Diagnosis not present

## 2017-03-09 DIAGNOSIS — E119 Type 2 diabetes mellitus without complications: Secondary | ICD-10-CM

## 2017-03-09 NOTE — Patient Instructions (Signed)
Your physician recommends that you schedule a follow-up appointment in: 3 months with Dr. Duke Salvia.

## 2017-03-09 NOTE — Progress Notes (Signed)
Cardiology Office Note    Date:  03/10/2017   ID:  Bradley Searles Sr., DOB [birth year], MRN [id number]  PCP:  [name], MD  Cardiologist:  Dr. Duke Salvia Primary electrophysiologist: Dr. Ladona Ridgel  Chief Complaint  Patient presents with  . Follow-up    Seen for Dr. Duke Salvia. pt denied chest pain and SOB, pt stated he's been doing great    History of Present Illness:  Bradley BEARDSLEE Sr. is a 76 y.o. male with PMH of chronic atrial fibrillation s/p AV nodal ablation and BiV PPM 2012, h/o NICM with improved EF, HTN, HLD, DM II, intracranial bleed (2013) dc'ed coumadin permanently, COPD on 2L O2, PAD and h/o pulm hypertension on cath. She underwent cardiac catheterization in both 2005 and 2008, no significant coronary artery disease were seen. BiV PPM and AV nodal ablation was performed on 09/08/2011 after found to have worsening ejection fraction and difficult to control atrial fibrillation with RVR. After placement of biventricular pacemaker, his ejection has gradually improved back to normal, EF was 50-55% on echocardiogram in 2017.  He was admitted to the hospital on 02/21/2017 with acute respiratory failure. Hee was treated for COPD exacerbation. Cardiology was also consulted for possible acute on chronic systolic heart failure. She was felt to be slightly volume overloaded as chest x-ray showed bilateral pleural effusion and the BNP was 491. He was given a dose of IV Lasix with significant improvement in his symptoms. Echocardiogram obtained on 02/22/2017 showed EF 55-60%, mild MR, peak PA pressure 55 mmHg. Decision was also confirmed to stay off Coumadin given history of intracranial bleed in 2013, despite CHA2DS-Vasc score of 8. He was eventually discharged on 60 mg twice a day of torsemide.  Patient has been doing well since leaving the hospital. He denies any significant lower extremity edema, orthopnea or PND. He only uses oxygen as needed. He has been compliant with his diuretic. Yesterday's  labs obtained at his primary care physician's office does show that his renal function is stable on current medication. His blood pressure today is borderline, I did ask him to monitor his blood pressure at home which he does every morning. He says his blood pressure at home this morning was in the 120s. He will let us know if his systolic blood pressure ever dip below 100. He has been quite compliant with avoidance of salt, we also discussed fluid restriction to less than 2 L per day and also monitoring weight every morning. He will let us know if his weight increased by more than 3 pounds overnight or 5 pounds in a single week. Otherwise he has been doing quite well and can follow-up in 3 months.   Past Medical History:  Diagnosis Date  . Abnormal CT scan, chest 2012   CT chest, several lymphadenopathies. Sees pulmonary  . Anemia    intermittent  . Arthritis    "?back" (08/22/2014)  . Atrial fibrillation (HCC)    s/p AV node ablation & BiV PPM implantation 09/08/11 (op dictation pending)  . BPH (benign prostatic hyperplasia)    Saw Dr [name] 2004, normal renal u/s  . CAD (coronary artery disease)   . CHF (congestive heart failure) (HCC)    Thought primarily to be non-systolic although EF down (EF 37-90% 12/2010, down to 35-40% 09/05/11), cath 2008 with no CAD, nuclear study 07/2011 showing Small area of reversibility in the distal ant/lat wall the left ventricle suspicious for ischemia/septal wall HK but felt to be low risk  (  per D/C Summary 07/2011)  . Chronic bronchitis (HCC)    "get it ~ q yr"  . Heart murmur   . HLD (hyperlipidemia)   . HTN (hypertension)   . 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 Insomnia   . Migraines    "very very rare"  . On home oxygen therapy    at night as needed  . Pacemaker   . Peripheral vascular disease (HCC)    ??  . Pleural effusion 2008   S/p decortication  . Pulmonary HTN (HCC)    per cath 2008  . Type II  diabetes mellitus (HCC) 1999    Past Surgical History:  Procedure Laterality Date  . BI-VENTRICULAR PACEMAKER INSERTION Left 09/08/2011   Procedure: BI-VENTRICULAR PACEMAKER INSERTION (CRT-P);  Surgeon: Marinus Maw, MD;  Location: [facility] CATH LAB;  Service: Cardiovascular;  Laterality: Left;  . CARDIAC CATHETERIZATION     "couple times; never had balloon or stent" (09/27/2012)  . COLONOSCOPY  03/10/11   normal  . INSERT / REPLACE / REMOVE PACEMAKER  09/08/11   pacemaker placement  . LUNG DECORTICATION    . PLEURAL SCARIFICATION    . pneumothorax with fibrothorax  ~ 2010  . TONSILLECTOMY     "as a kid" (09/27/2012)    Current Medications: Outpatient Medications Prior to Visit  Medication Sig Dispense Refill  . Acetaminophen (TYLENOL PO) Take 1 tablet by mouth 2 (two) times daily as needed (pain).    Marland Kitchen albuterol (PROVENTIL HFA;VENTOLIN HFA) 108 (90 Base) MCG/ACT inhaler Inhale 2 puffs into the lungs every 4 (four) hours as needed for wheezing or shortness of breath. 1 Inhaler 2  . amLODipine (NORVASC) 10 MG tablet Take 1 tablet (10 mg total) by mouth daily. 90 tablet 1  . aspirin 81 MG tablet Take 81 mg by mouth every morning.     Marland Kitchen atorvastatin (LIPITOR) 20 MG tablet Take 20 mg by mouth at bedtime.    . budesonide-formoterol (SYMBICORT) 160-4.5 MCG/ACT inhaler Inhale 2 puffs into the lungs 2 (two) times daily. 1 Inhaler 0  . calcium-vitamin D (OSCAL WITH D) 500-200 MG-UNIT tablet Take 1 tablet by mouth every morning.    . carvedilol (COREG) 25 MG tablet Take 1.5 tablets (37.5 mg total) by mouth 2 (two) times daily with a meal. 270 tablet 1  . glucose blood test strip Check blood sugar no more than twice daily 200 each 12  . Lancets (ONETOUCH ULTRASOFT) lancets Check blood sugars no more than twice daily 200 each 12  . losartan (COZAAR) 100 MG tablet Take 1 tablet (100 mg total) by mouth daily. 90 tablet 1  . Multiple Vitamins-Minerals (MULTIVITAMINS THER. W/MINERALS) TABS tablet Take 1  tablet by mouth daily. 30 each 5  . nitroGLYCERIN (NITROSTAT) 0.4 MG SL tablet DISSOLVE 1 TABLET UNDER THE TONGUE EVERY 5 MINUTES AS NEEDED FOR CHEST PAIN 50 tablet 3  . pantoprazole (PROTONIX) 40 MG tablet TAKE 1 TABLET EVERY MORNING 90 tablet 2  . sitaGLIPtin (JANUVIA) 100 MG tablet Take 1 tablet (100 mg total) by mouth daily. 90 tablet 0  . tamsulosin (FLOMAX) 0.4 MG CAPS capsule Take 1 capsule (0.4 mg total) by mouth daily. 90 capsule 3  . torsemide (DEMADEX) 20 MG tablet Take 3 tablets (60 mg total) by mouth 2 (two) times daily. 180 tablet 0  . zolpidem (AMBIEN) 10 MG tablet Take 1 tablet (10 mg total) by mouth at bedtime as needed for sleep. 90 tablet  1   No facility-administered medications prior to visit.      Allergies:   Hydrocodone; Tramadol; Tadalafil; and Avelox [moxifloxacin hydrochloride]   Social History   Social History  . Marital status: Married    Spouse name: [name]  . Number of children: 1  . Years of education: N/A   Occupational History  . 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 Smoking status: Former Smoker    Packs/day: 2.50    Years: 25.00    Types: Cigarettes    Quit date: 10/11/1980  . Smokeless tobacco: Never Used     Comment: used to smoke 2.5 ppd for 25 years  . Alcohol use No     Comment: 08/22/2014 "maybe q couple months I'll have a beer w/dinner"  . Drug use: No  . Sexual activity: Not Currently   Other Topics Concern  . None   Social History Narrative   Moved from [place] 2003.Marland Kitchen   He lives [place]/ his wife, [name]   1 son, substance abuse, passed away Sep 21, 2016              Family History:  The patient's family history includes Breast cancer in his maternal aunt; Coronary artery disease in his father; Diabetes in his father; Drug abuse in his son; Polycythemia in his mother.   ROS:   Please see the history of present illness.    ROS All other systems reviewed and are negative.   PHYSICAL EXAM:   VS:  BP  102/62   Pulse 82   Ht 5\' 9"  (1.753 m)   Wt 183 lb 9.6 oz (83.3 kg)   BMI 27.11 kg/m    GEN: Well nourished, well developed, in no acute distress  HEENT: normal  Neck: no JVD, carotid bruits, or masses Cardiac: RRR; no murmurs, rubs, or gallops,no edema  Respiratory:  clear to auscultation bilaterally, normal work of breathing GI: soft, nontender, nondistended, + BS MS: no deformity or atrophy  Skin: warm and dry, no rash Neuro:  Alert and Oriented x 3, Strength and sensation are intact Psych: euthymic mood, full affect  Wt Readings from Last 3 Encounters:  03/09/17 183 lb 9.6 oz (83.3 kg)  03/08/17 184 lb 6 oz (83.6 kg)  02/24/17 187 lb 8 oz (85 kg)      Studies/Labs Reviewed:   EKG:  EKG is not ordered today.    Recent Labs: 02/21/2017: ALT 17; B Natriuretic Peptide 461.3 03/08/2017: BUN 28; Creatinine, Ser 0.96; Hemoglobin 14.1; Platelets 182.0; Potassium 3.9; Sodium 139   Lipid Panel    Component Value Date/Time   CHOL 105 02/03/2017 0821   TRIG 71.0 02/03/2017 0821   HDL 38.30 (L) 02/03/2017 0821   CHOLHDL 3 02/03/2017 0821   VLDL 14.2 02/03/2017 0821   LDLCALC 53 02/03/2017 0821    Additional studies/ records that were reviewed today include:   Cath 12/19/2003 PROCEDURES:  1. Left heart catheterization.  2. Selective coronary arteriography.  3. Selective left ventriculography.  4. Distal aortography.   DESCRIPTION OF PROCEDURE:  The procedure was performed from the right  femoral artery using 6 French catheters.  He tolerated the procedure well  and there were no complications.  He was taken to the holding area in  satisfactory clinical condition.   HEMODYNAMIC DATA:  1. Central aorta:  140/101, mean 119.  2. Left ventricle:  135/21.  3. No gradient on pullback across the aortic valve.   ANGIOGRAPHIC DATA:  1. Ventriculography was performed in the RAO projection.  Overall systolic     function was mildly reduced, but likely related to the  patient's     moderately rapid heart rate.  There was no definite mitral regurgitation.  2. The left main was short and consisted of common funnel leading into an     LAD and circumflex.  3. The left anterior descending coursed to the apex.  There is mild luminal     irregularity distally and mild irregularity of the distal portion or the     diagonal with about 30-40% narrowing there.  No high grade areas of     obstruction were noted.  4. There is a small, tiny ramus intermedius without significant narrowing.  5. The circumflex provides a co-dominant system.  There is mild luminal     irregularity in the ramus intermedius, but without high grade     obstruction.  There is perhaps 30% narrowing in the AV circumflex.  There     is a co-dominant system.  6. The right coronary artery provides a single PDA with some tortuosity.     There is minimal luminal irregularity, but no significant areas of focal     stenosis.   CONCLUSION:  1. No high grade areas of obstruction.  2. Slight reduction in overall left ventricular function.  Probably related     to the patient's atrial fibrillation.   RECOMMENDATIONS:  1. Good rate control.  2. Coumadin anticoagulation.  3. Continued followup with Dr. Glennon Hamilton.     Cath 05/10/2007 HEMODYNAMIC DATA:  1. Right atrial pressure was 15.  2. RV was 46/11 with a mean of 18.  3. PA was 42/19 with a mean of 29.  4. Pulmonary capillary wedge was 25 at end expiration.  5. Aortic was 112/79.  6. Cardiac output by Fick 3.9 L/min.  Cardiac index 1.81 L/min. per      sq. m.  7. Thermodilution cardiac output 5.60 L/min.  8. Thermodilution cardiac index 2.60 L/min. per sq. m.   ANGIOGRAPHIC DATA:  1. The left main was large and free of critical disease.  2. The LAD coursed to the apex.  There was a major diagonal branch.      The LAD and its branches were free of critical disease.  They      appeared to be relatively smooth throughout.  3. The  circumflex was a large-caliber system.  There did not appear to      be critical stenosis of any of the branches.  4. The right coronary artery provided predominantly a PDA with minimal      luminal irregularity and no critical stenosis.   CONCLUSIONS:  1. Coronary arteriography demonstrated no evidence of high-grade      coronary obstruction.   1. Moderate pulmonary hypertension, question secondary to diastolic      dysfunction.   RECOMMENDATIONS:  The patient may require a sleep study, and I have  discussed this with Dr. Ladona Ridgel.  We will have him follow up with Dr.  Ladona Ridgel in about a week.  In addition, I would consider performing a CT  angiogram of the aorta.  The patient has a fair amount of iliac  tortuosity, and also there is some calcification of the aortic knob.  With his history of chest pain, chronic Coumadin therapy thus making  pulmonary embolus less likely, and lack of significant coronary  obstruction, and with increasing chest discomfort, CT would be helpful  to rule out some form of alternative answer such as chronic dissection.  This will be arranged as an outpatient with Dr. Ladona Ridgel.   Echo 02/22/2017 LV EF: 55% -   60%  Study Conclusions  - Left ventricle: The cavity size was normal. Wall thickness was   increased in a pattern of mild LVH. Systolic function was normal.   The estimated ejection fraction was in the range of 55% to 60%.   The study is not technically sufficient to allow evaluation of LV   diastolic function. - Aortic valve: Trileaflet. Sclerosis without stenosis. There was   no regurgitation. - Mitral valve: Mildly thickened leaflets . There was mild   regurgitation. - Left atrium: Moderately dilated. - Right ventricle: The cavity size was mildly dilated. Pacer wire   or catheter noted in right ventricle. Systolic function was   normal. - Right atrium: Moderately dilated. Pacer wire or catheter noted in   right atrium. - Tricuspid valve:  There was moderate regurgitation. - Pulmonary arteries: PA peak pressure: 55 mm Hg (S). - Inferior vena cava: The vessel was dilated. The respirophasic   diameter changes were blunted (< 50%), consistent with elevated   central venous pressure.  Impressions:  - Compared to a prior echo in 12/2015, the LVEF is higher at 55-60%.   Pacemaker wires are noted. The RVSP has increased from 47 mmHg to   55 mmHg.    ASSESSMENT:    1. Chronic diastolic heart failure (HCC)   2. Chronic atrial fibrillation (HCC)   3. S/P biventricular cardiac pacemaker procedure   4. Essential hypertension   5. Hyperlipidemia, unspecified hyperlipidemia type   6. Controlled type 2 diabetes mellitus without complication, without long-term current use of insulin (HCC)      PLAN:  In order of problems listed above:  1. Chronic diastolic heart failure  - Euvolemic on physical exam, prior to recent hospitalization, he was on 60 mg twice a day of Lasix, he has since been started on 60 mg twice a day of torsemide instead. Based on lab work obtained by his primary care physician yesterday, his renal function and electrolyte is stable on current medication.  2. Chronic atrial fibrillation on aspirin: CHA2DS2-Vasc score 6 (age, HTN, DM II, HF, mild CAD). He is not on systemic anticoagulation due to history of intracranial bleed as evident by CT of the head in 2013. Heart rate currently controlled via biventricular pacemaker after AV nodal ablation.  3. H/o AV nodal ablation and BiV PPM: Had AV nodal ablation and biventricular pacemaker placed in November 2012 due to difficult to control atrial fibrillation with RVR and presumably tachycardia mediated cardiomyopathy. Ejection fraction has since normalized.  4. HTN: Blood pressure stable on current medication.  5. HLD: On Lipitor 20 mg daily.  6. DM II: On Januvia    Medication Adjustments/Labs and Tests Ordered: Current medicines are reviewed at length with the  patient today.  Concerns regarding medicines are outlined above.  Medication changes, Labs and Tests ordered today are listed in the Patient Instructions below. Patient Instructions  Your physician recommends that you schedule a follow-up appointment in: 3 months with Dr. Duke Salvia.    Ramond Dial, [place]  03/10/2017 2:19 PM    [facility] [address] [place], [place], [place]  [address] Phone: [phone]; Fax: [phone]

## 2017-03-16 ENCOUNTER — Encounter: Payer: Self-pay | Admitting: Cardiology

## 2017-03-22 ENCOUNTER — Other Ambulatory Visit: Payer: Self-pay | Admitting: Internal Medicine

## 2017-03-25 ENCOUNTER — Telehealth: Payer: Self-pay | Admitting: Internal Medicine

## 2017-03-25 NOTE — Telephone Encounter (Signed)
Reviewed chart appears pt needs an appt with Dr Duke Salvia in  Months appt made see appt./cy

## 2017-03-25 NOTE — Telephone Encounter (Signed)
Patient states that he received a call to schedule with Dr. Ladona Ridgel but is scheduled to come in September. Patient would like to know if the call was to get him in sooner or if he can keep his September appt. Thanks.

## 2017-03-28 ENCOUNTER — Telehealth: Payer: Self-pay | Admitting: Internal Medicine

## 2017-03-28 MED ORDER — TORSEMIDE 20 MG PO TABS: 60.0000 mg | ORAL_TABLET | Freq: Two times a day (BID) | ORAL | 3 refills | Status: DC

## 2017-03-28 MED ORDER — TAMSULOSIN HCL 0.4 MG PO CAPS: 0.4000 mg | ORAL_CAPSULE | Freq: Every day | ORAL | 1 refills | Status: DC

## 2017-03-28 MED ORDER — AMLODIPINE BESYLATE 10 MG PO TABS: 10.0000 mg | ORAL_TABLET | Freq: Every day | ORAL | 1 refills | Status: DC

## 2017-03-28 MED ORDER — CARVEDILOL 25 MG PO TABS: 37.5000 mg | ORAL_TABLET | Freq: Two times a day (BID) | ORAL | 1 refills | Status: DC

## 2017-03-28 MED ORDER — ATORVASTATIN CALCIUM 20 MG PO TABS: 20.0000 mg | ORAL_TABLET | Freq: Every day | ORAL | 1 refills | Status: DC

## 2017-03-28 MED ORDER — SITAGLIPTIN PHOSPHATE 100 MG PO TABS: 100.0000 mg | ORAL_TABLET | Freq: Every day | ORAL | 1 refills | Status: DC

## 2017-03-28 MED ORDER — PANTOPRAZOLE SODIUM 40 MG PO TBEC: 40.0000 mg | DELAYED_RELEASE_TABLET | Freq: Every morning | ORAL | 1 refills | Status: DC

## 2017-03-28 MED ORDER — ZOLPIDEM TARTRATE 10 MG PO TABS: 10.0000 mg | ORAL_TABLET | Freq: Every evening | ORAL | 1 refills | Status: DC | PRN

## 2017-03-28 MED ORDER — LOSARTAN POTASSIUM 100 MG PO TABS: 100.0000 mg | ORAL_TABLET | Freq: Every day | ORAL | 1 refills | Status: DC
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
## 2017-05-06 ENCOUNTER — Telehealth: Payer: Self-pay | Admitting: Internal Medicine

## 2017-05-06 MED ORDER — CARVEDILOL 25 MG PO TABS: 37.5000 mg | ORAL_TABLET | Freq: Two times a day (BID) | ORAL | 1 refills | Status: DC

## 2017-05-06 MED ORDER — LOSARTAN POTASSIUM 100 MG PO TABS: 100.0000 mg | ORAL_TABLET | Freq: Every day | ORAL | 1 refills | Status: DC

## 2017-05-06 MED ORDER — TAMSULOSIN HCL 0.4 MG PO CAPS: 0.4000 mg | ORAL_CAPSULE | Freq: Every day | ORAL | 1 refills | Status: DC

## 2017-05-06 MED ORDER — SITAGLIPTIN PHOSPHATE 100 MG PO TABS: 100.0000 mg | ORAL_TABLET | Freq: Every day | ORAL | 1 refills | Status: DC

## 2017-05-06 MED ORDER — TORSEMIDE 20 MG PO TABS: 60.0000 mg | ORAL_TABLET | Freq: Two times a day (BID) | ORAL | 1 refills | Status: DC

## 2017-05-06 MED ORDER — AMLODIPINE BESYLATE 10 MG PO TABS: 10.0000 mg | ORAL_TABLET | Freq: Every day | ORAL | 1 refills | Status: DC

## 2017-05-06 MED ORDER — ATORVASTATIN CALCIUM 20 MG PO TABS: 20.0000 mg | ORAL_TABLET | Freq: Every day | ORAL | 1 refills | Status: DC

## 2017-05-06 MED ORDER — PANTOPRAZOLE SODIUM 40 MG PO TBEC: 40.0000 mg | DELAYED_RELEASE_TABLET | Freq: Every morning | ORAL | 1 refills | Status: DC

## 2017-05-06 NOTE — Telephone Encounter (Signed)
Caller name: Bradley [name] Relation to pt: self  Call back number: [phone] Pharmacy:  Reason for call: Pt called stating has some concerns about all his meds and needs to speak with a nurse. Please advise.

## 2017-05-06 NOTE — Telephone Encounter (Signed)
Pt needing refills sent to [facility]-----> will do.

## 2017-05-06 NOTE — Telephone Encounter (Signed)
Tried calling Pt, telephone busy. Will try again later.  

## 2017-05-06 NOTE — Telephone Encounter (Signed)
Pt return call to speak with nurse.(nurse not available at moment) Please advise.

## 2017-05-25 ENCOUNTER — Encounter: Payer: Self-pay | Admitting: Internal Medicine

## 2017-05-26 ENCOUNTER — Encounter: Payer: Self-pay | Admitting: Cardiovascular Disease

## 2017-05-26 ENCOUNTER — Ambulatory Visit (INDEPENDENT_AMBULATORY_CARE_PROVIDER_SITE_OTHER): Payer: Medicare Other | Admitting: Cardiovascular Disease

## 2017-05-26 VITALS — BP 75/47 | HR 75 | Ht 69.0 in | Wt 181.8 lb

## 2017-05-26 DIAGNOSIS — I952 Hypotension due to drugs: Secondary | ICD-10-CM | POA: Diagnosis not present

## 2017-05-26 DIAGNOSIS — I482 Chronic atrial fibrillation, unspecified: Secondary | ICD-10-CM

## 2017-05-26 DIAGNOSIS — I10 Essential (primary) hypertension: Secondary | ICD-10-CM | POA: Diagnosis not present

## 2017-05-26 DIAGNOSIS — I5032 Chronic diastolic (congestive) heart failure: Secondary | ICD-10-CM

## 2017-05-26 DIAGNOSIS — Z950 Presence of cardiac pacemaker: Secondary | ICD-10-CM | POA: Diagnosis not present

## 2017-05-26 NOTE — Progress Notes (Signed)
Cardiology Office Note   Date:  05/26/2017   ID:  Bradley Searles Sr., DOB Sep 16, 1941, MRN [id number]  PCP:  [name], MD  Cardiologist:   [name], MD   Chief Complaint  Patient presents with  . Follow-up    pt denied chest pain, pt stated he's doing great     History of Present Illness: Bradley MATHIESON Sr. is a 76 y.o. male with chronic atrial fibrillation s/p AVN ablation and CRT-P, chronic diastolic heart failure, mild aortic stenosis, CAD, hypertension, hyperlipidemia, prior ICH (no anticoagulation), and PAD who presents for follow-up.  He previously underwent left heart catheterization in 2005 in 2018 and had no significant coronary artery disease. He had a BiV pacemaker and AV nodal ablation 09/08/11. At that time he had reduced systolic function in the setting of atrial fibrillation with RVR. After placement of the biventricular pacemaker his ejection fraction improved to 50-55% in 2017.  He was admitted 02/2017 with COPD exacerbation and acute on chronic diastolic heart failure. BNP was 491 and TEE had pleural effusions on chest x-ray. He improved with diuresis. Echocardiogram at that time revealed LVEF 55-60%, mild mitral regurgitation, and PA SP 55 mmHg.  He followed up with [name] on 03/09/17 and was doing well.  Since his last appointment Bradley [name] has been doing well.  His breathing has been great.  He denies lower extremity edema, orthopnea or PND.  He hasn't been lightheaded or dizzy.  He doesn't check his BP at home.  He was mistakingly taking both lasix and torsemide.  He also thinks that he took an extra blood pressure pill this AM, though he is not sure which one.  He has been walking for exercise daily and has no exertional symptoms.    Past Medical History:  Diagnosis Date  . Abnormal CT scan, chest 2012   CT chest, several lymphadenopathies. Sees pulmonary  . Anemia    intermittent  . Arthritis    "?back" (08/22/2014)  . Atrial fibrillation (HCC)    s/p  AV node ablation & BiV PPM implantation 09/08/11 (op dictation pending)  . BPH (benign prostatic hyperplasia)    Saw Dr [name] 2004, normal renal u/s  . CAD (coronary artery disease)   . CHF (congestive heart failure) (HCC)    Thought primarily to be non-systolic although EF down (EF 40-98% 12/2010, down to 35-40% 09/05/11), cath 2008 with no CAD, nuclear study 07/2011 showing Small area of reversibility in the distal ant/lat wall the left ventricle suspicious for ischemia/septal wall HK but felt to be low risk  (per D/C Summary 07/2011)  . Chronic bronchitis (HCC)    "get it ~ q yr"  . Heart murmur   . HLD (hyperlipidemia)   . HTN (hypertension)   . 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 Insomnia   . Migraines    "very very rare"  . On home oxygen therapy    at night as needed  . Pacemaker   . Peripheral vascular disease (HCC)    ??  . Pleural effusion 2008   S/p decortication  . Pulmonary HTN (HCC)    per cath 2008  . Type II diabetes mellitus (HCC) 1999    Past Surgical History:  Procedure Laterality Date  . BI-VENTRICULAR PACEMAKER INSERTION Left 09/08/2011   Procedure: BI-VENTRICULAR PACEMAKER INSERTION (CRT-P);  Surgeon: Marinus Maw, MD;  Location: [facility] CATH LAB;  Service: Cardiovascular;  Laterality: Left;  .  CARDIAC CATHETERIZATION     "couple times; never had balloon or stent" (09/27/2012)  . COLONOSCOPY  03/10/11   normal  . INSERT / REPLACE / REMOVE PACEMAKER  09/08/11   pacemaker placement  . LUNG DECORTICATION    . PLEURAL SCARIFICATION    . pneumothorax with fibrothorax  ~ 2010  . TONSILLECTOMY     "as a kid" (09/27/2012)     Current Outpatient Prescriptions  Medication Sig Dispense Refill  . Acetaminophen (TYLENOL PO) Take 1 tablet by mouth 2 (two) times daily as needed (pain).    Marland Kitchen amLODipine (NORVASC) 10 MG tablet Take 1 tablet (10 mg total) by mouth daily. 90 tablet 1  . aspirin 81 MG tablet Take 81 mg by mouth every  morning.     Marland Kitchen atorvastatin (LIPITOR) 20 MG tablet Take 1 tablet (20 mg total) by mouth at bedtime. 90 tablet 1  . calcium-vitamin D (OSCAL WITH D) 500-200 MG-UNIT tablet Take 1 tablet by mouth every morning.    . carvedilol (COREG) 25 MG tablet Take 25 mg by mouth 2 (two) times daily with a meal.    . glucose blood test strip Check blood sugar no more than twice daily 200 each 12  . Lancets (ONETOUCH ULTRASOFT) lancets Check blood sugars no more than twice daily 200 each 12  . losartan (COZAAR) 100 MG tablet Take 1 tablet (100 mg total) by mouth daily. 90 tablet 1  . Multiple Vitamins-Minerals (MULTIVITAMINS THER. W/MINERALS) TABS tablet Take 1 tablet by mouth daily. 30 each 5  . nitroGLYCERIN (NITROSTAT) 0.4 MG SL tablet DISSOLVE 1 TABLET UNDER THE TONGUE EVERY 5 MINUTES AS NEEDED FOR CHEST PAIN 50 tablet 3  . pantoprazole (PROTONIX) 40 MG tablet Take 1 tablet (40 mg total) by mouth every morning. 90 tablet 1  . sitaGLIPtin (JANUVIA) 100 MG tablet Take 1 tablet (100 mg total) by mouth daily. 90 tablet 1  . tamsulosin (FLOMAX) 0.4 MG CAPS capsule Take 1 capsule (0.4 mg total) by mouth daily. 90 capsule 1  . torsemide (DEMADEX) 20 MG tablet Take 3 tablets (60 mg total) by mouth 2 (two) times daily. 540 tablet 1  . zolpidem (AMBIEN) 10 MG tablet Take 1 tablet (10 mg total) by mouth at bedtime as needed for sleep. 30 tablet 1   No current facility-administered medications for this visit.     Allergies:   Hydrocodone; Tramadol; Tadalafil; and Avelox [moxifloxacin hydrochloride]    Social History:  The patient  reports that he quit smoking about 36 years ago. His smoking use included Cigarettes. He has a 62.50 pack-year smoking history. He has never used smokeless tobacco. He reports that he does not drink alcohol or use drugs.   Family History:  The patient's family history includes Breast cancer in his maternal aunt; Coronary artery disease in his father; Diabetes in his father; Drug abuse in  his son; Polycythemia in his mother.    ROS:  Please see the history of present illness.   Otherwise, review of systems are positive for none.   All other systems are reviewed and negative.    PHYSICAL EXAM: VS:  BP (!) 75/47   Pulse 75   Ht 5\' 9"  (1.753 m)   Wt 82.5 kg (181 lb 12.8 oz)   BMI 26.85 kg/m  , BMI Body mass index is 26.85 kg/m. GENERAL:  Well appearing.  No acute distress.  HEENT:  Pupils equal round and reactive, fundi not visualized, oral mucosa unremarkable NECK:  No jugular venous distention, waveform within normal limits, carotid upstroke brisk and symmetric, no bruits, no thyromegaly LUNGS:  Clear to auscultation bilaterally HEART:  Irregularly irregular.  PMI not displaced or sustained,S1 and S2 within normal limits, no S3, no S4, no clicks, no rubs, no murmurs ABD:  Flat, positive bowel sounds normal in frequency in pitch, no bruits, no rebound, no guarding, no midline pulsatile mass, no hepatomegaly, no splenomegaly EXT:  2 plus pulses throughout, no edema, no cyanosis no clubbing SKIN:  No rashes no nodules NEURO:  Cranial nerves II through XII grossly intact, motor grossly intact throughout PSYCH:  Cognitively intact, oriented to person place and time   EKG:  EKG is ordered today. The ekg ordered 05/26/17 demonstrates atrial fibrillation.  Ventricular paced at 75 bpm   Recent Labs: 02/21/2017: ALT 17; B Natriuretic Peptide 461.3 03/08/2017: BUN 28; Creatinine, Ser 0.96; Hemoglobin 14.1; Platelets 182.0; Potassium 3.9; Sodium 139    Lipid Panel    Component Value Date/Time   CHOL 105 02/03/2017 0821   TRIG 71.0 02/03/2017 0821   HDL 38.30 (L) 02/03/2017 0821   CHOLHDL 3 02/03/2017 0821   VLDL 14.2 02/03/2017 0821   LDLCALC 53 02/03/2017 0821      Wt Readings from Last 3 Encounters:  05/26/17 82.5 kg (181 lb 12.8 oz)  03/09/17 83.3 kg (183 lb 9.6 oz)  03/08/17 83.6 kg (184 lb 6 oz)      ASSESSMENT AND PLAN:  # Chronic diastolic heart failure:   # Hypertensive heart disesase: # Hypotension:  Bradley [name] is euvolemic and feeling well.  However his BP is very low.  He remains asymptomatic.  He will hold torsemide and carvedilol tonight.  He hasn't been taking his medications as prescribed.  He will stop taking lasix and only take torsemide.  We will also reduce carvedilol to 25mg  bid from 37.5mg .  Check BMP today.  Continue amlodipine and losartan.  # Longstanding Persistent Atrial fibrillation:  Asymptomatic.  Reduce carvedilol as above.  Bradley [name] is not on anticoagulation due to prior intracranial bleed   Current medicines are reviewed at length with the patient today.  The patient does not have concerns regarding medicines.  The following changes have been made:  Reduce carvedilol.  Stop lasix.   Labs/ tests ordered today include:  No orders of the defined types were placed in this encounter.    Disposition:   FU with Trinadee Verhagen C. Duke Salvia, MD, [facility] in 3 months.  One week with PharmD.     This note was written with the assistance of speech recognition software.  Please excuse any transcriptional errors.  Signed, Kenlei Safi C. Duke Salvia, MD, [facility]  05/26/2017 6:28 PM     [facility]

## 2017-05-26 NOTE — Patient Instructions (Signed)
Medication Instructions:  NO CARVEDILOL TONIGHT STARTING TOMORROW ONLY TAKE 1 TABLET TWICE A DAY  NO TORSEMIDE TONIGHT  STOP FUROSEMIDE   Labwork: NONE  Testing/Procedures: NONE  Follow-Up: Your physician recommends that you schedule a follow-up appointment in: PHARM D FOR BLOOD PRESSURE 1 WEEK  Your physician recommends that you schedule a follow-up appointment in: 3 MONTH OV WITH DR [facility]   If you need a refill on your cardiac medications before your next appointment, please call your pharmacy.

## 2017-05-30 ENCOUNTER — Ambulatory Visit (INDEPENDENT_AMBULATORY_CARE_PROVIDER_SITE_OTHER): Payer: Medicare Other | Admitting: *Deleted

## 2017-05-30 DIAGNOSIS — I495 Sick sinus syndrome: Secondary | ICD-10-CM

## 2017-05-31 NOTE — Progress Notes (Signed)
Remote pacemaker transmission.   

## 2017-06-01 LAB — CUP PACEART REMOTE DEVICE CHECK
Battery Remaining Longevity: 54 mo
Battery Voltage: 2.98 V
Brady Statistic AP VP Percent: 0 %
Brady Statistic AP VS Percent: 0 %
Brady Statistic AS VP Percent: 96.64 %
Brady Statistic AS VS Percent: 3.36 %
Brady Statistic RA Percent Paced: 0 %
Brady Statistic RV Percent Paced: 98.09 %
Date Time Interrogation Session: 20180820172002
Implantable Lead Implant Date: 20121128
Implantable Lead Implant Date: 20121128
Implantable Lead Location: 753858
Implantable Lead Location: 753860
Implantable Lead Model: 4194
Implantable Lead Model: 5076
Implantable Pulse Generator Implant Date: 20121128
Lead Channel Impedance Value: 342 Ohm
Lead Channel Impedance Value: 4047 Ohm
Lead Channel Impedance Value: 4047 Ohm
Lead Channel Impedance Value: 418 Ohm
Lead Channel Impedance Value: 494 Ohm
Lead Channel Impedance Value: 494 Ohm
Lead Channel Impedance Value: 570 Ohm
Lead Channel Impedance Value: 608 Ohm
Lead Channel Impedance Value: 608 Ohm
Lead Channel Pacing Threshold Amplitude: 0.625 V
Lead Channel Pacing Threshold Amplitude: 1.5 V
Lead Channel Pacing Threshold Pulse Width: 0.4 ms
Lead Channel Pacing Threshold Pulse Width: 0.4 ms
Lead Channel Sensing Intrinsic Amplitude: 5.125 mV
Lead Channel Sensing Intrinsic Amplitude: 5.125 mV
Lead Channel Setting Pacing Amplitude: 2 V
Lead Channel Setting Pacing Amplitude: 2.5 V
Lead Channel Setting Pacing Pulse Width: 0.4 ms
Lead Channel Setting Pacing Pulse Width: 0.4 ms
Lead Channel Setting Sensing Sensitivity: 4 mV

## 2017-06-06 ENCOUNTER — Ambulatory Visit (INDEPENDENT_AMBULATORY_CARE_PROVIDER_SITE_OTHER): Payer: Medicare Other | Admitting: Pharmacist Clinician (PhC)/ Clinical Pharmacy Specialist

## 2017-06-06 DIAGNOSIS — I10 Essential (primary) hypertension: Secondary | ICD-10-CM

## 2017-06-06 NOTE — Progress Notes (Signed)
06/06/2017 Dierdre [name] Sr. [birth year] [id number]   HPI:  Bradley [name]. is a 76 y.o. male patient of Dr Duke Salvia, with a PMH below who presents today for hypertension clinic evaluation.  His medical history is significant for chronic AF post AVN ablation and CRT with pacemaker, chronic diastolic heart failure, CAD, hypertension, hyperlipidemia, prior ICH and PAD.  When he saw Dr. Duke Salvia earlier this month he was taking carvedilol 37.5 mg bid, amlodipine 10mg  qd and losartan 100 mg qd.   Because of his low BP in the office, she reduced the carvedilol to 25 mg twice daily.  He is here today for follow up.    Patient reports that after his last visit, he went home and re-organized his pill boxes.  Made sure that he was not doubling up or missing any doses.   He has continued to check home BP readings and although he has no readings with him, states they have all been "good".    Blood Pressure Goal:  130/80   Current Medications:  Amlodipine 10 mg qam  Carvedilol 25 mg bid  (previously 37.5 mg bid)  Losartan 100 mg qam  Family Hx:  Mother "thickness of the blood" died in her 74's   Father died of old age at 71  Brother with hypertension  Son died at Christmas - [employer] with PTSD, died from drug overdose   Social Hx:  Quit tobacco over 25 years ago; no alcohol; caffeine consumption includes coffee 3 cups per day, Coke Zero daily  Diet:  Even mix of eating at home and out.  Eating out also even mix of fast food and sit down restaurants.  He does most of the cooking at home right now, adds NoSalt when cooks but nothing at the table.  Exercise:  No formal exercise - stays active around yard and stores  Home BP readings:  Systolic readings never as high as 150.  Will occasionally get systolic < 100.   Mostly 110-120 range.  Did not bring home cuff or list of readings with him.  States cuff is for wrist.  Intolerances:   CrCl cannot be calculated (Patient's most recent lab  result is older than the maximum 21 days allowed.).  Wt Readings from Last 3 Encounters:  05/26/17 181 lb 12.8 oz (82.5 kg)  03/09/17 183 lb 9.6 oz (83.3 kg)  03/08/17 184 lb 6 oz (83.6 kg)   BP Readings from Last 3 Encounters:  05/26/17 (!) 75/47  03/09/17 102/62  03/08/17 126/68   Pulse Readings from Last 3 Encounters:  05/26/17 75  03/09/17 82  03/08/17 79    Current Outpatient Prescriptions  Medication Sig Dispense Refill  . Acetaminophen (TYLENOL PO) Take 1 tablet by mouth 2 (two) times daily as needed (pain).    Marland Kitchen amLODipine (NORVASC) 10 MG tablet Take 1 tablet (10 mg total) by mouth daily. 90 tablet 1  . aspirin 81 MG tablet Take 81 mg by mouth every morning.     Marland Kitchen atorvastatin (LIPITOR) 20 MG tablet Take 1 tablet (20 mg total) by mouth at bedtime. 90 tablet 1  . calcium-vitamin D (OSCAL WITH D) 500-200 MG-UNIT tablet Take 1 tablet by mouth every morning.    . carvedilol (COREG) 25 MG tablet Take 25 mg by mouth 2 (two) times daily with a meal.    . glucose blood test strip Check blood sugar no more than twice daily 200 each 12  . Lancets [facility]  ULTRASOFT) lancets Check blood sugars no more than twice daily 200 each 12  . losartan (COZAAR) 100 MG tablet Take 1 tablet (100 mg total) by mouth daily. 90 tablet 1  . Multiple Vitamins-Minerals (MULTIVITAMINS THER. W/MINERALS) TABS tablet Take 1 tablet by mouth daily. 30 each 5  . nitroGLYCERIN (NITROSTAT) 0.4 MG SL tablet DISSOLVE 1 TABLET UNDER THE TONGUE EVERY 5 MINUTES AS NEEDED FOR CHEST PAIN 50 tablet 3  . pantoprazole (PROTONIX) 40 MG tablet Take 1 tablet (40 mg total) by mouth every morning. 90 tablet 1  . sitaGLIPtin (JANUVIA) 100 MG tablet Take 1 tablet (100 mg total) by mouth daily. 90 tablet 1  . tamsulosin (FLOMAX) 0.4 MG CAPS capsule Take 1 capsule (0.4 mg total) by mouth daily. 90 capsule 1  . torsemide (DEMADEX) 20 MG tablet Take 3 tablets (60 mg total) by mouth 2 (two) times daily. 540 tablet 1  . zolpidem  (AMBIEN) 10 MG tablet Take 1 tablet (10 mg total) by mouth at bedtime as needed for sleep. 30 tablet 1   No current facility-administered medications for this visit.     Allergies  Allergen Reactions  . Hydrocodone Other (See Comments)    "given to him in the hospital; went thru withdrawals once home; dr said not to take it again" (09/27/2012)  . Tramadol Other (See Comments)    "given to him in the hospital; went thru withdrawals once home; dr said not to take it again" (09/27/2012)  . Tadalafil Other (See Comments)    headache, backache  . Avelox [Moxifloxacin Hydrochloride] Itching and Other (See Comments)    Headache    Past Medical History:  Diagnosis Date  . Abnormal CT scan, chest 2012   CT chest, several lymphadenopathies. Sees pulmonary  . Anemia    intermittent  . Arthritis    "?back" (08/22/2014)  . Atrial fibrillation (HCC)    s/p AV node ablation & BiV PPM implantation 09/08/11 (op dictation pending)  . BPH (benign prostatic hyperplasia)    Saw Dr [name] 2004, normal renal u/s  . CAD (coronary artery disease)   . CHF (congestive heart failure) (HCC)    Thought primarily to be non-systolic although EF down (EF 09-40% 12/2010, down to 35-40% 09/05/11), cath 2008 with no CAD, nuclear study 07/2011 showing Small area of reversibility in the distal ant/lat wall the left ventricle suspicious for ischemia/septal wall HK but felt to be low risk  (per D/C Summary 07/2011)  . Chronic bronchitis (HCC)    "get it ~ q yr"  . Heart murmur   . HLD (hyperlipidemia)   . HTN (hypertension)   . 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 Insomnia   . Migraines    "very very rare"  . On home oxygen therapy    at night as needed  . Pacemaker   . Peripheral vascular disease (HCC)    ??  . Pleural effusion 2008   S/p decortication  . Pulmonary HTN (HCC)    per cath 2008  . Type II diabetes mellitus (HCC) 1999    There were no vitals taken for this  visit.  No problem-specific Assessment & Plan notes found for this encounter.   Phillips Hay PharmD CPP [facility]

## 2017-06-06 NOTE — Patient Instructions (Signed)
Return for a a follow up appointment in 1 month  Your blood pressure today is 82/52   Check your blood pressure at home several days each week and keep record of the readings.  Take your BP meds as follows:  Decrease losartan to 50 mg each day (1/2 tablet).  Switch this to evenings instead of mornings.   Continue with all other medications  Bring all of your meds, your BP cuff and your record of home blood pressures to your next appointment.  Exercise as you're able, try to walk approximately 30 minutes per day.  Keep salt intake to a minimum, especially watch canned and prepared boxed foods.  Eat more fresh fruits and vegetables and fewer canned items.  Avoid eating in fast food restaurants.    HOW TO TAKE YOUR BLOOD PRESSURE: . Rest 5 minutes before taking your blood pressure. .  Don't smoke or drink caffeinated beverages for at least 30 minutes before. . Take your blood pressure before (not after) you eat. . Sit comfortably with your back supported and both feet on the floor (don't cross your legs). . Elevate your arm to heart level on a table or a desk. . Use the proper sized cuff. It should fit smoothly and snugly around your bare upper arm. There should be enough room to slip a fingertip under the cuff. The bottom edge of the cuff should be 1 inch above the crease of the elbow. . Ideally, take 3 measurements at one sitting and record the average.

## 2017-06-06 NOTE — Assessment & Plan Note (Signed)
Patient hypotensive in office again.  He is completely asymptomatic, no concerns for orthostatic changes either.  He states his home cuff was 118 systolic this am, however was only 82 by office cuff.  Will have him decrease losartan to 50 mg each day and move to evenings.  He should continue all other medications.  Will have him check home BP 3-4 times per week over the next month and gave him a log to record this.  Will see him back at that time and he knows to bring the log as well as his wrist cuff at that time.

## 2017-06-10 ENCOUNTER — Encounter: Payer: Self-pay | Admitting: Cardiology

## 2017-06-16 ENCOUNTER — Encounter: Payer: Self-pay | Admitting: Internal Medicine

## 2017-06-16 ENCOUNTER — Ambulatory Visit (INDEPENDENT_AMBULATORY_CARE_PROVIDER_SITE_OTHER): Payer: Medicare Other | Admitting: Internal Medicine

## 2017-06-16 VITALS — BP 110/66 | HR 78 | Ht 69.0 in | Wt 187.2 lb

## 2017-06-16 DIAGNOSIS — I482 Chronic atrial fibrillation, unspecified: Secondary | ICD-10-CM

## 2017-06-16 NOTE — Patient Instructions (Signed)
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
## 2017-06-17 LAB — CUP PACEART INCLINIC DEVICE CHECK
Battery Remaining Longevity: 55 mo
Battery Voltage: 2.98 V
Brady Statistic AP VP Percent: 0 %
Brady Statistic AP VS Percent: 0 %
Brady Statistic AS VP Percent: 96.81 %
Brady Statistic AS VS Percent: 3.19 %
Brady Statistic RA Percent Paced: 0 %
Brady Statistic RV Percent Paced: 98.61 %
Date Time Interrogation Session: 20180906202827
Implantable Lead Implant Date: 20121128
Implantable Lead Implant Date: 20121128
Implantable Lead Location: 753858
Implantable Lead Location: 753860
Implantable Lead Model: 4194
Implantable Lead Model: 5076
Implantable Pulse Generator Implant Date: 20121128
Lead Channel Impedance Value: 342 Ohm
Lead Channel Impedance Value: 4047 Ohm
Lead Channel Impedance Value: 4047 Ohm
Lead Channel Impedance Value: 437 Ohm
Lead Channel Impedance Value: 456 Ohm
Lead Channel Impedance Value: 494 Ohm
Lead Channel Impedance Value: 570 Ohm
Lead Channel Impedance Value: 608 Ohm
Lead Channel Impedance Value: 627 Ohm
Lead Channel Pacing Threshold Amplitude: 0.75 V
Lead Channel Pacing Threshold Amplitude: 1.125 V
Lead Channel Pacing Threshold Pulse Width: 0.4 ms
Lead Channel Pacing Threshold Pulse Width: 0.4 ms
Lead Channel Sensing Intrinsic Amplitude: 5.125 mV
Lead Channel Sensing Intrinsic Amplitude: 5.125 mV
Lead Channel Setting Pacing Amplitude: 2 V
Lead Channel Setting Pacing Amplitude: 2.25 V
Lead Channel Setting Pacing Pulse Width: 0.4 ms
Lead Channel Setting Pacing Pulse Width: 0.4 ms
Lead Channel Setting Sensing Sensitivity: 4 mV

## 2017-06-20 ENCOUNTER — Telehealth: Payer: Self-pay

## 2017-06-20 NOTE — Telephone Encounter (Signed)
PA initiated via Covermymeds; KEY: [id number]. Received PA approval.   [id number];[id number];Review Type:Qty;Coverage Start Date:05/21/2017;Coverage End Date:06/20/2018;

## 2017-06-24 ENCOUNTER — Encounter: Payer: Self-pay | Admitting: Cardiology

## 2017-07-05 ENCOUNTER — Ambulatory Visit (INDEPENDENT_AMBULATORY_CARE_PROVIDER_SITE_OTHER): Payer: Medicare Other | Admitting: Internal Medicine

## 2017-07-05 ENCOUNTER — Encounter: Payer: Self-pay | Admitting: Internal Medicine

## 2017-07-05 VITALS — BP 126/72 | HR 82 | Temp 98.2°F | Resp 14 | Ht 69.0 in | Wt 185.5 lb

## 2017-07-05 DIAGNOSIS — I5032 Chronic diastolic (congestive) heart failure: Secondary | ICD-10-CM | POA: Diagnosis not present

## 2017-07-05 DIAGNOSIS — E119 Type 2 diabetes mellitus without complications: Secondary | ICD-10-CM | POA: Diagnosis not present

## 2017-07-05 DIAGNOSIS — I10 Essential (primary) hypertension: Secondary | ICD-10-CM

## 2017-07-05 DIAGNOSIS — Z23 Encounter for immunization: Secondary | ICD-10-CM | POA: Diagnosis not present

## 2017-07-05 LAB — BASIC METABOLIC PANEL
BUN: 21 mg/dL (ref 6–23)
CO2: 34 mEq/L — ABNORMAL HIGH (ref 19–32)
Calcium: 9.2 mg/dL (ref 8.4–10.5)
Chloride: 101 mEq/L (ref 96–112)
Creatinine, Ser: 0.93 mg/dL (ref 0.40–1.50)
GFR: 83.87 mL/min (ref >60.00)
Glucose, Bld: 109 mg/dL — ABNORMAL HIGH (ref 70–99)
Potassium: 3.9 mEq/L (ref 3.5–5.1)
Sodium: 141 mEq/L (ref 135–145)

## 2017-07-05 LAB — HEMOGLOBIN A1C: Hgb A1c MFr Bld: 5.9 % (ref 4.6–6.5)

## 2017-07-05 MED ORDER — ZOLPIDEM TARTRATE 10 MG PO TABS: 10.0000 mg | ORAL_TABLET | Freq: Every evening | ORAL | 1 refills | Status: DC | PRN

## 2017-07-05 NOTE — Progress Notes (Signed)
Pre visit review using our clinic review tool, if applicable. No additional management support is needed unless otherwise documented below in the visit note. 

## 2017-07-05 NOTE — Progress Notes (Signed)
Ambien Rx faxed to [facility].

## 2017-07-05 NOTE — Patient Instructions (Signed)
GO TO THE LAB : Get the blood work     GO TO THE FRONT DESK Schedule your next appointment for a   routine checkup in 4 months 

## 2017-07-05 NOTE — Progress Notes (Signed)
Subjective:    Patient ID: Bradley Sit., male    DOB: Sep 22, 1941, 76 y.o.   MRN: [id number]  DOS:  07/05/2017 Type of visit - description : rov Since last visit  He is doing well. HTN, CHF: Good compliance of medication, losartan dose decreased to 50 mg daily at cardiology few weeks ago due to low blood pressure. Ambulatory BPs usually 110, 130. No low BPs at home and SBP very rarely goes to the 150s. DM: Good compliance of medication. No recent ambulatory CBGs. Insomnia: Needs a refill on Ambien  Wt Readings from Last 3 Encounters:  07/05/17 185 lb 8 oz (84.1 kg)  06/16/17 187 lb 3.2 oz (84.9 kg)  05/26/17 181 lb 12.8 oz (82.5 kg)    BP Readings from Last 3 Encounters:  07/05/17 126/72  06/16/17 110/66  06/06/17 (!) 82/52     Review of Systems   Past Medical History:  Diagnosis Date  . Abnormal CT scan, chest 2012   CT chest, several lymphadenopathies. Sees pulmonary  . Anemia    intermittent  . Arthritis    "?back" (08/22/2014)  . Atrial fibrillation (HCC)    s/p AV node ablation & BiV PPM implantation 09/08/11 (op dictation pending)  . BPH (benign prostatic hyperplasia)    Saw Dr [name] 2004, normal renal u/s  . CAD (coronary artery disease)   . CHF (congestive heart failure) (HCC)    Thought primarily to be non-systolic although EF down (EF 40-98% 12/2010, down to 35-40% 09/05/11), cath 2008 with no CAD, nuclear study 07/2011 showing Small area of reversibility in the distal ant/lat wall the left ventricle suspicious for ischemia/septal wall HK but felt to be low risk  (per D/C Summary 07/2011)  . Chronic bronchitis (HCC)    "get it ~ q yr"  . Heart murmur   . HLD (hyperlipidemia)   . HTN (hypertension)   . 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 Insomnia   . Migraines    "very very rare"  . On home oxygen therapy    at night as needed  . Pacemaker   . Peripheral vascular disease (HCC)    ??  . Pleural effusion 2008   S/p decortication  . Pulmonary HTN (HCC)    per cath 2008  . Type II diabetes mellitus (HCC) 1999    Past Surgical History:  Procedure Laterality Date  . BI-VENTRICULAR PACEMAKER INSERTION Left 09/08/2011   Procedure: BI-VENTRICULAR PACEMAKER INSERTION (CRT-P);  Surgeon: Marinus Maw, MD;  Location: [facility] CATH LAB;  Service: Cardiovascular;  Laterality: Left;  . CARDIAC CATHETERIZATION     "couple times; never had balloon or stent" (09/27/2012)  . COLONOSCOPY  03/10/11   normal  . INSERT / REPLACE / REMOVE PACEMAKER  09/08/11   pacemaker placement  . LUNG DECORTICATION    . PLEURAL SCARIFICATION    . pneumothorax with fibrothorax  ~ 2010  . TONSILLECTOMY     "as a kid" (09/27/2012)    Social History   Social History  . Marital status: Married    Spouse name: [name]  . Number of children: 1  . Years of education: N/A   Occupational History  . 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 Smoking status: Former Smoker    Packs/day: 2.50    Years: 25.00    Types: Cigarettes    Quit date: 10/11/1980  . Smokeless tobacco: Never Used  Comment: used to smoke 2.5 ppd for 25 years  . Alcohol use No     Comment: 08/22/2014 "maybe q couple months I'll have a beer w/dinner"  . Drug use: No  . Sexual activity: Not Currently   Other Topics Concern  . Not on file   Social History Narrative   Moved from [place] 2003.Marland Kitchen   He lives w/ his wife, [name]   1 son, substance abuse, passed away 10-13-16               Allergies as of 07/05/2017      Reactions   Hydrocodone Other (See Comments)   "given to him in the hospital; went thru withdrawals once home; dr said not to take it again" (09/27/2012)   Tramadol Other (See Comments)   "given to him in the hospital; went thru withdrawals once home; dr said not to take it again" (09/27/2012)   Tadalafil Other (See Comments)   headache, backache   Avelox [moxifloxacin Hydrochloride] Itching, Other (See  Comments)   Headache      Medication List       Accurate as of 07/05/17 11:59 PM. Always use your most recent med list.          amLODipine 10 MG tablet Commonly known as:  NORVASC Take 1 tablet (10 mg total) by mouth daily.   aspirin 81 MG tablet Take 81 mg by mouth every morning.   atorvastatin 20 MG tablet Commonly known as:  LIPITOR Take 1 tablet (20 mg total) by mouth at bedtime.   calcium-vitamin D 500-200 MG-UNIT tablet Commonly known as:  OSCAL WITH D Take 1 tablet by mouth every morning.   carvedilol 25 MG tablet Commonly known as:  COREG Take 25 mg by mouth 2 (two) times daily with a meal.   glucose blood test strip Check blood sugar no more than twice daily   losartan 100 MG tablet Commonly known as:  COZAAR Take 50 mg by mouth daily.   multivitamins ther. w/minerals Tabs tablet Take 1 tablet by mouth daily.   nitroGLYCERIN 0.4 MG SL tablet Commonly known as:  NITROSTAT DISSOLVE 1 TABLET UNDER THE TONGUE EVERY 5 MINUTES AS NEEDED FOR CHEST PAIN   onetouch ultrasoft lancets Check blood sugars no more than twice daily   pantoprazole 40 MG tablet Commonly known as:  PROTONIX Take 1 tablet (40 mg total) by mouth every morning.   sitaGLIPtin 100 MG tablet Commonly known as:  JANUVIA Take 1 tablet (100 mg total) by mouth daily.   tamsulosin 0.4 MG Caps capsule Commonly known as:  FLOMAX Take 1 capsule (0.4 mg total) by mouth daily.   torsemide 20 MG tablet Commonly known as:  DEMADEX Take 3 tablets (60 mg total) by mouth 2 (two) times daily.   TYLENOL PO Take 1 tablet by mouth 2 (two) times daily as needed (pain).   zolpidem 10 MG tablet Commonly known as:  AMBIEN Take 1 tablet (10 mg total) by mouth at bedtime as needed for sleep.            Discharge Care Instructions        Start     Ordered   07/05/17 0000  Flu vaccine HIGH DOSE PF (Fluzone High dose)     07/05/17 1044   07/05/17 0000  Hemoglobin A1c     07/05/17 1051    07/05/17 0000  Basic metabolic panel     07/05/17 1051   07/05/17 0000  zolpidem (AMBIEN)  10 MG tablet  At bedtime PRN     07/05/17 1052         Objective:   Physical Exam BP 126/72 (BP Location: Left Arm, Patient Position: Sitting, Cuff Size: Small)   Pulse 82   Temp 98.2 F (36.8 C) (Oral)   Resp 14   Ht  (1.753 m)   Wt 185 lb 8 oz (84.1 kg)   SpO2 96%   BMI 27.39 kg/m  General:   Well developed, well nourished . NAD.  HEENT:  Normocephalic . Face symmetric, atraumatic Lungs:  Decreased breath sounds but clear Normal respiratory effort, no intercostal retractions, no accessory muscle use. Heart: RRR,  no murmur.  No pretibial edema bilaterally  Skin: Not pale. Not jaundice Neurologic:  alert & oriented X3.  Speech normal, gait appropriate for age and unassisted Psych--  Cognition and judgment appear intact.  Cooperative with normal attention span and concentration.  Behavior appropriate. No anxious or depressed appearing.      Assessment & Plan:   Assessment  DM HTN Hyperlipidemia Anxiety- insomnia  BPH Cardiovascular:Dr Ladona Ridgel  --CAD -- CHF --Atrial fibrillation;  --Pacemaker --h/o IC bleeding: Not anticoagulated --Peripheral vascular disease? Pulmonary: Dr [name], last OV 10-18-2014, f/u prn --COPD, severe, chronic resp failure, night O2 prn, has portable O2  --Pleural effusion s/p decortication --Pulmonary hypertension  Stasis dermatitis   PLAN: DM: Continue with Januvia, check A1c. HTN few weeks ago BP was low, cardiology decrease losartan dose to 50 mg daily. Good compliance. He is also on amlodipine, carvedilol, Demadex. Check a BMP. CHF: Seems to be doing well. Atrial fibrillation: On aspirin, not anticoagulated due to history of ICB. Insomnia: Refill Ambien Flu shot today RTC 4 months routine checkup.

## 2017-07-06 NOTE — Assessment & Plan Note (Signed)
DM: Continue with Januvia, check A1c. HTN few weeks ago BP was low, cardiology decrease losartan dose to 50 mg daily. Good compliance. He is also on amlodipine, carvedilol, Demadex. Check a BMP. CHF: Seems to be doing well. Atrial fibrillation: On aspirin, not anticoagulated due to history of ICB. Insomnia: Refill Ambien Flu shot today RTC 4 months routine checkup.

## 2017-07-07 ENCOUNTER — Ambulatory Visit (INDEPENDENT_AMBULATORY_CARE_PROVIDER_SITE_OTHER): Payer: Medicare Other | Admitting: Pharmacist Clinician (PhC)/ Clinical Pharmacy Specialist

## 2017-07-07 ENCOUNTER — Encounter: Payer: Self-pay | Admitting: Pharmacist Clinician (PhC)/ Clinical Pharmacy Specialist

## 2017-07-07 DIAGNOSIS — I10 Essential (primary) hypertension: Secondary | ICD-10-CM

## 2017-07-07 NOTE — Assessment & Plan Note (Signed)
Patient with essential hypertension, now well controlled.  He was actually having problems with hypotension, but now that carvedilol and losartan have both been decreased, his pressures are back up to normal readings.  He is to continue with his current medications and can call with any future concerns or problems.

## 2017-07-07 NOTE — Patient Instructions (Signed)
  Your blood pressure today is 118/68  (goal is < 130/80)  Check your blood pressure at home several days each week and keep record of the readings.  Take your BP meds as follows:  No changes in medications  Bring all of your meds, your BP cuff and your record of home blood pressures to your next appointment.  Exercise as you're able, try to walk approximately 30 minutes per day.  Keep salt intake to a minimum, especially watch canned and prepared boxed foods.  Eat more fresh fruits and vegetables and fewer canned items.  Avoid eating in fast food restaurants.    HOW TO TAKE YOUR BLOOD PRESSURE: . Rest 5 minutes before taking your blood pressure. .  Don't smoke or drink caffeinated beverages for at least 30 minutes before. . Take your blood pressure before (not after) you eat. . Sit comfortably with your back supported and both feet on the floor (don't cross your legs). . Elevate your arm to heart level on a table or a desk. . Use the proper sized cuff. It should fit smoothly and snugly around your bare upper arm. There should be enough room to slip a fingertip under the cuff. The bottom edge of the cuff should be 1 inch above the crease of the elbow. . Ideally, take 3 measurements at one sitting and record the average.

## 2017-07-07 NOTE — Progress Notes (Signed)
07/07/2017 Bradley Searles Sr. [birth year] [id number]   HPI:  Bradley [name]. is a 76 y.o. male patient of Dr Duke Salvia, with a PMH below who presents today for hypertension clinic evaluation.  His medical history is significant for chronic AF post AVN ablation and CRT with pacemaker, chronic diastolic heart failure, CAD, hypertension, hyperlipidemia, prior ICH and PAD.  When he saw Dr. Duke Salvia earlier this month he was taking carvedilol 37.5 mg bid, amlodipine 10mg  qd and losartan 100 mg qd.   Because of his low BP in the office, she reduced the carvedilol to 25 mg twice daily.  When I saw him 2 weeks later, his BP was still low, and the losartan was cut back from 100 mg to 50 mg each evening.  Since then he has seen Dr. Ladona Ridgel as well as his PCP, and at both visits BP was higher and still WNL.   Today he returns for follow up.  He has no complaints today, states feeling great.  Continues to check home BP readings twice daily and is here today with his list of readings as well as his home cuff.    Blood Pressure Goal:  130/80   Current Medications:  Amlodipine 10 mg qam  Carvedilol 25 mg bid  (previously 37.5 mg bid)  Losartan 50 mg qpm  Family Hx:  Mother "thickness of the blood" died in her 52's   Father died of old age at 81  Brother with hypertension  Son died at Christmas - [employer] with PTSD, died from drug overdose   Social Hx:  Quit tobacco over 25 years ago; no alcohol; caffeine consumption includes coffee 3 cups per day, Coke Zero daily  Diet:  Even mix of eating at home and out.  Eating out also even mix of fast food and sit down restaurants.  He does most of the cooking at home right now, adds NoSalt when cooks but nothing at the table.  Exercise:  No formal exercise - stays active around yard and stores  Home BP readings:  Checked twice daily most days over past month;  AM average 116/68, PM average 115/63.  Home cuff is wrist monitor that checked within 5 points  of office cuff  Intolerances:   No antihypertensive medications Labs:   06/2017 - Na 141, K 3.9, BUN 21, SCr 0.93, glucose 109  Wt Readings from Last 3 Encounters:  07/05/17 185 lb 8 oz (84.1 kg)  06/16/17 187 lb 3.2 oz (84.9 kg)  05/26/17 181 lb 12.8 oz (82.5 kg)   BP Readings from Last 3 Encounters:  07/07/17 118/68  07/05/17 126/72  06/16/17 110/66   Pulse Readings from Last 3 Encounters:  07/07/17 80  07/05/17 82  06/16/17 78    Current Outpatient Prescriptions  Medication Sig Dispense Refill  . Acetaminophen (TYLENOL PO) Take 1 tablet by mouth 2 (two) times daily as needed (pain).    Marland Kitchen amLODipine (NORVASC) 10 MG tablet Take 1 tablet (10 mg total) by mouth daily. 90 tablet 1  . aspirin 81 MG tablet Take 81 mg by mouth every morning.     Marland Kitchen atorvastatin (LIPITOR) 20 MG tablet Take 1 tablet (20 mg total) by mouth at bedtime. 90 tablet 1  . calcium-vitamin D (OSCAL WITH D) 500-200 MG-UNIT tablet Take 1 tablet by mouth every morning.    . carvedilol (COREG) 25 MG tablet Take 25 mg by mouth 2 (two) times daily with a meal.    .  glucose blood test strip Check blood sugar no more than twice daily 200 each 12  . Lancets (ONETOUCH ULTRASOFT) lancets Check blood sugars no more than twice daily 200 each 12  . losartan (COZAAR) 100 MG tablet Take 50 mg by mouth daily.    . Multiple Vitamins-Minerals (MULTIVITAMINS THER. W/MINERALS) TABS tablet Take 1 tablet by mouth daily. 30 each 5  . nitroGLYCERIN (NITROSTAT) 0.4 MG SL tablet DISSOLVE 1 TABLET UNDER THE TONGUE EVERY 5 MINUTES AS NEEDED FOR CHEST PAIN (Patient not taking: Reported on 07/05/2017) 50 tablet 3  . pantoprazole (PROTONIX) 40 MG tablet Take 1 tablet (40 mg total) by mouth every morning. 90 tablet 1  . sitaGLIPtin (JANUVIA) 100 MG tablet Take 1 tablet (100 mg total) by mouth daily. 90 tablet 1  . tamsulosin (FLOMAX) 0.4 MG CAPS capsule Take 1 capsule (0.4 mg total) by mouth daily. 90 capsule 1  . torsemide (DEMADEX) 20 MG  tablet Take 3 tablets (60 mg total) by mouth 2 (two) times daily. 540 tablet 1  . zolpidem (AMBIEN) 10 MG tablet Take 1 tablet (10 mg total) by mouth at bedtime as needed for sleep. 90 tablet 1   No current facility-administered medications for this visit.     Allergies  Allergen Reactions  . Hydrocodone Other (See Comments)    "given to him in the hospital; went thru withdrawals once home; dr said not to take it again" (09/27/2012)  . Tramadol Other (See Comments)    "given to him in the hospital; went thru withdrawals once home; dr said not to take it again" (09/27/2012)  . Tadalafil Other (See Comments)    headache, backache  . Avelox [Moxifloxacin Hydrochloride] Itching and Other (See Comments)    Headache    Past Medical History:  Diagnosis Date  . Abnormal CT scan, chest 2012   CT chest, several lymphadenopathies. Sees pulmonary  . Anemia    intermittent  . Arthritis    "?back" (08/22/2014)  . Atrial fibrillation (HCC)    s/p AV node ablation & BiV PPM implantation 09/08/11 (op dictation pending)  . BPH (benign prostatic hyperplasia)    Saw Dr [name] 2004, normal renal u/s  . CAD (coronary artery disease)   . CHF (congestive heart failure) (HCC)    Thought primarily to be non-systolic although EF down (EF 14-78% 12/2010, down to 35-40% 09/05/11), cath 2008 with no CAD, nuclear study 07/2011 showing Small area of reversibility in the distal ant/lat wall the left ventricle suspicious for ischemia/septal wall HK but felt to be low risk  (per D/C Summary 07/2011)  . Chronic bronchitis (HCC)    "get it ~ q yr"  . Heart murmur   . HLD (hyperlipidemia)   . HTN (hypertension)   . 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 Insomnia   . Migraines    "very very rare"  . On home oxygen therapy    at night as needed  . Pacemaker   . Peripheral vascular disease (HCC)    ??  . Pleural effusion 2008   S/p decortication  . Pulmonary HTN (HCC)    per  cath 2008  . Type II diabetes mellitus (HCC) 1999    Blood pressure 118/68, pulse 80.  Essential hypertension Patient with essential hypertension, now well controlled.  He was actually having problems with hypotension, but now that carvedilol and losartan have both been decreased, his pressures are back up to normal readings.  He is  to continue with his current medications and can call with any future concerns or problems.    Phillips Hay PharmD CPP [facility]

## 2017-08-22 ENCOUNTER — Other Ambulatory Visit: Payer: Self-pay | Admitting: Internal Medicine

## 2017-08-26 ENCOUNTER — Ambulatory Visit (INDEPENDENT_AMBULATORY_CARE_PROVIDER_SITE_OTHER): Payer: Medicare Other | Admitting: Cardiovascular Disease

## 2017-08-26 ENCOUNTER — Encounter: Payer: Self-pay | Admitting: Cardiovascular Disease

## 2017-08-26 VITALS — BP 90/50 | HR 82 | Ht 69.0 in | Wt 191.0 lb

## 2017-08-26 DIAGNOSIS — I952 Hypotension due to drugs: Secondary | ICD-10-CM

## 2017-08-26 DIAGNOSIS — I495 Sick sinus syndrome: Secondary | ICD-10-CM

## 2017-08-26 DIAGNOSIS — J40 Bronchitis, not specified as acute or chronic: Secondary | ICD-10-CM

## 2017-08-26 DIAGNOSIS — I482 Chronic atrial fibrillation, unspecified: Secondary | ICD-10-CM

## 2017-08-26 DIAGNOSIS — E785 Hyperlipidemia, unspecified: Secondary | ICD-10-CM

## 2017-08-26 HISTORY — DX: Bronchitis, not specified as acute or chronic: J40

## 2017-08-26 NOTE — Progress Notes (Signed)
Cardiology Office Note   Date:  08/26/2017   ID:  Bradley [name] Sr., DOB 12/29/1940, MRN [id number]  PCP:  Bradley Plump, MD  Cardiologist:   Bradley Si, MD   Chief Complaint  Patient presents with  . Follow-up     History of Present Illness: Bradley PILLEY Sr. is a 76 y.o. male with chronic atrial fibrillation s/p AVN ablation and CRT-P, chronic diastolic heart failure, mild aortic stenosis, CAD, hypertension, hyperlipidemia, prior ICH (no anticoagulation), and PAD who presents for follow-up.  He previously underwent left heart catheterization in 2005 and in 2018 and had no significant coronary artery disease. He had a BiV pacemaker and AV nodal ablation 09/08/11. At that time he had reduced systolic function in the setting of atrial fibrillation with RVR. After placement of the biventricular pacemaker his ejection fraction improved to 50-55% in 2017.  He was admitted 02/2017 with COPD exacerbation and acute on chronic diastolic heart failure. BNP was 491 and TEE had pleural effusions on chest x-ray. He improved with diuresis. Echocardiogram at that time revealed LVEF 55-60%, mild mitral regurgitation, and PA SP 55 mmHg.  He followed up with [name] on 03/09/17 and was doing well.  At his last appointment Mr. Commins was doing well but his blood pressure was low.  It was noted that he was taking both torsemide and furosemide.  Furosemide was discontinued and carvedilol was reduced.  Since his last appointment Mr. Heiny has been feeling well.  However, he has been struggling with a cold for the last 3 days.  He has a productive cough but no fever or chills.  He feels that he is starting to get better.  He hasn't experienced any chest pain, shortness of breath, lower extremity edema, orthopnea or PND.  He recently visited his new great grandchild.  He BP log at home shows his BP has ranged from the 110s to the 120s.  Today it was was 89/49.     Past Medical History:  Diagnosis Date  .  Abnormal CT scan, chest 2012   CT chest, several lymphadenopathies. Sees pulmonary  . Anemia    intermittent  . Arthritis    "?back" (08/22/2014)  . Atrial fibrillation (HCC)    s/p AV node ablation & BiV PPM implantation 09/08/11 (op dictation pending)  . BPH (benign prostatic hyperplasia)    Saw Dr Bradley [name] 2004, normal renal u/s  . Bronchitis 08/26/2017  . CAD (coronary artery disease)   . CHF (congestive heart failure) (HCC)    Thought primarily to be non-systolic although EF down (EF 40-98% 12/2010, down to 35-40% 09/05/11), cath 2008 with no CAD, nuclear study 07/2011 showing Small area of reversibility in the distal ant/lat wall the left ventricle suspicious for ischemia/septal wall HK but felt to be low risk  (per D/C Summary 07/2011)  . Chronic bronchitis (HCC)    "get it ~ q yr"  . Heart murmur   . HLD (hyperlipidemia)   . HTN (hypertension)   . 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 Insomnia   . Migraines    "very very rare"  . On home oxygen therapy    at night as needed  . Pacemaker   . Peripheral vascular disease (HCC)    ??  . Pleural effusion 2008   S/p decortication  . Pulmonary HTN (HCC)    per cath 2008  . Type II diabetes mellitus (HCC) 1999  Past Surgical History:  Procedure Laterality Date  . BI-VENTRICULAR PACEMAKER INSERTION (CRT-P) Left 09/08/2011   Performed by Bradley Maw, MD at [facility] CATH LAB  . CARDIAC CATHETERIZATION     "couple times; never had balloon or stent" (09/27/2012)  . COLONOSCOPY  03/10/11   normal  . INSERT / REPLACE / REMOVE PACEMAKER  09/08/11   pacemaker placement  . LUNG DECORTICATION    . PLEURAL SCARIFICATION    . pneumothorax with fibrothorax  ~ 2010  . TONSILLECTOMY     "as a kid" (09/27/2012)     Current Outpatient Medications  Medication Sig Dispense Refill  . Acetaminophen (TYLENOL PO) Take 1 tablet by mouth 2 (two) times daily as needed (pain).    Marland Kitchen amLODipine (NORVASC) 10 MG  tablet Take 1 tablet (10 mg total) by mouth daily. (Patient not taking: Reported on 08/26/2017) 90 tablet 1  . aspirin 81 MG tablet Take 81 mg by mouth every morning.     Marland Kitchen atorvastatin (LIPITOR) 20 MG tablet Take 1 tablet (20 mg total) by mouth at bedtime. (Patient not taking: Reported on 08/26/2017) 90 tablet 1  . calcium-vitamin D (OSCAL WITH D) 500-200 MG-UNIT tablet Take 1 tablet by mouth every morning.    . carvedilol (COREG) 25 MG tablet Take 25 mg by mouth 2 (two) times daily with a meal.    . glucose blood test strip Check blood sugar no more than twice daily (Patient not taking: Reported on 08/26/2017) 200 each 12  . Lancets (ONETOUCH ULTRASOFT) lancets Check blood sugars no more than twice daily (Patient not taking: Reported on 08/26/2017) 200 each 12  . losartan (COZAAR) 100 MG tablet Take 50 mg by mouth daily.    . Multiple Vitamins-Minerals (MULTIVITAMINS THER. W/MINERALS) TABS tablet Take 1 tablet by mouth daily. (Patient not taking: Reported on 08/26/2017) 30 each 5  . nitroGLYCERIN (NITROSTAT) 0.4 MG SL tablet DISSOLVE 1 TABLET UNDER THE TONGUE EVERY 5 MINUTES AS NEEDED FOR CHEST PAIN (Patient not taking: Reported on 07/05/2017) 50 tablet 3  . pantoprazole (PROTONIX) 40 MG tablet Take 1 tablet (40 mg total) by mouth every morning. (Patient not taking: Reported on 08/26/2017) 90 tablet 1  . sitaGLIPtin (JANUVIA) 100 MG tablet Take 1 tablet (100 mg total) by mouth daily. (Patient not taking: Reported on 08/26/2017) 90 tablet 1  . tamsulosin (FLOMAX) 0.4 MG CAPS capsule Take 1 capsule (0.4 mg total) by mouth daily. (Patient not taking: Reported on 08/26/2017) 90 capsule 1  . torsemide (DEMADEX) 20 MG tablet Take 3 tablets (60 mg total) 2 (two) times daily by mouth. (Patient not taking: Reported on 08/26/2017) 540 tablet 1  . zolpidem (AMBIEN) 10 MG tablet Take 1 tablet (10 mg total) by mouth at bedtime as needed for sleep. (Patient not taking: Reported on 08/26/2017) 90 tablet 1   No  current facility-administered medications for this visit.     Allergies:   Hydrocodone; Tramadol; Tadalafil; and Avelox [moxifloxacin hydrochloride]    Social History:  The patient  reports that he quit smoking about 36 years ago. His smoking use included cigarettes. He has a 62.50 pack-year smoking history. he has never used smokeless tobacco. He reports that he does not drink alcohol or use drugs.   Family History:  The patient's family history includes Breast cancer in his maternal aunt; Coronary artery disease in his father; Diabetes in his father; Drug abuse in his son; Polycythemia in his mother.    ROS:  Please see the  history of present illness.   Otherwise, review of systems are positive for none.   All other systems are reviewed and negative.    PHYSICAL EXAM: VS:  BP (!) 90/50   Pulse 82   Ht 5\' 9"  (1.753 m)   Wt 86.6 kg (191 lb)   BMI 28.21 kg/m  , BMI Body mass index is 28.21 kg/m. GENERAL:  Well appearing.  No acute distress.  HEENT:  Pupils equal round and reactive, fundi not visualized, oral mucosa unremarkable NECK:  No jugular venous distention, waveform within normal limits, carotid upstroke brisk and symmetric, no bruits, no thyromegaly LUNGS:  Diffuse rhonchi HEART:  Irregularly irregular.  PMI not displaced or sustained,S1 and S2 within normal limits, no S3, no S4, no clicks, no rubs, no murmurs ABD:  Flat, positive bowel sounds normal in frequency in pitch, no bruits, no rebound, no guarding, no midline pulsatile mass, no hepatomegaly, no splenomegaly EXT:  2 plus pulses throughout, no edema, no cyanosis no clubbing SKIN:  No rashes no nodules NEURO:  Cranial nerves II through XII grossly intact, motor grossly intact throughout PSYCH:  Cognitively intact, oriented to person place and time   EKG:  EKG is ordered today. The ekg ordered 05/26/17 demonstrates atrial fibrillation.  Ventricular paced at 75 bpm 08/26/17: VP.  Rate 82 bpm.     Recent  Labs: 02/21/2017: ALT 17; B Natriuretic Peptide 461.3 03/08/2017: Hemoglobin 14.1; Platelets 182.0 07/05/2017: BUN 21; Creatinine, Ser 0.93; Potassium 3.9; Sodium 141    Lipid Panel    Component Value Date/Time   CHOL 105 02/03/2017 0821   TRIG 71.0 02/03/2017 0821   HDL 38.30 (L) 02/03/2017 0821   CHOLHDL 3 02/03/2017 0821   VLDL 14.2 02/03/2017 0821   LDLCALC 53 02/03/2017 0821      Wt Readings from Last 3 Encounters:  08/26/17 86.6 kg (191 lb)  07/05/17 84.1 kg (185 lb 8 oz)  06/16/17 84.9 kg (187 lb 3.2 oz)      ASSESSMENT AND PLAN:  # Chronic diastolic heart failure:  # Hypertensive heart disesase: # Hypotension:  Bradley [name] is euvolemic.  He is feeling well from a cardiac standpoint.  His BP is low, though he isn't lightheaded or dizzy.  He has bronchitis right now which is likely the cause of his low BP.  It is otherwise very stable.  He will check his BP while sick and hold amlodipine and losartan if SBP is <100 mmHg.  He will also only take torsemide once daily while his BP is low.   # Longstanding Persistent Atrial fibrillation:  Asymptomatic.  Continue carvedilol.  Bradley [name] is not on anticoagulation due to prior intracranial bleed   # Bronchitis: Bradley [name] has diffuse rhonchi on exam but no fever or chills and is otherwise well.  If his symtpoms do not improve by early next week he will call his PCP.    Current medicines are reviewed at length with the patient today.  The patient does not have concerns regarding medicines.  The following changes have been made:  none  Labs/ tests ordered today include:  No orders of the defined types were placed in this encounter.    Disposition:   FU with Toshiyuki Fredell C. [name], MD, [facility] in 6 months.   This note was written with the assistance of speech recognition software.  Please excuse any transcriptional errors.  Signed, Darshana Curnutt C. [name], MD, [facility]  08/26/2017 2:13 PM    [facility]

## 2017-08-26 NOTE — Addendum Note (Signed)
Addended by: Regis Bill B on: 08/26/2017 04:01 PM   Modules accepted: Orders

## 2017-08-26 NOTE — Patient Instructions (Signed)
Medication Instructions:  Your physician recommends that you continue on your current medications as directed. Please refer to the Current Medication list given to you today.  Labwork: NONE  Testing/Procedures: NONE  Follow-Up: Your physician wants you to follow-up in: 6 MONTH OV  You will receive a reminder letter in the mail two months in advance. If you don't receive a letter, please call our office to schedule the follow-up appointment.   Any Other Special Instructions Will Be Listed Below (If Applicable).  IF YOU ARE NOT FEELING ANY BETTER BY NEXT WEEK CALL DR PAZ  WHILE YOU HAVE YOUR COLD SYMPTOMS IF YOUR SYSTOLIC BLOOD PRESSURE (TOP NUMBER) IS BELOW 100 DO NOT TAKE YOUR LOSARTAN OR AMLODIPINE AND ONLY TAKE YOUR TORSEMIDE ONCE THAT DAY   If you need a refill on your cardiac medications before your next appointment, please call your pharmacy.

## 2017-08-29 ENCOUNTER — Telehealth: Payer: Self-pay | Admitting: Cardiology

## 2017-08-29 ENCOUNTER — Ambulatory Visit (INDEPENDENT_AMBULATORY_CARE_PROVIDER_SITE_OTHER): Payer: Medicare Other | Admitting: *Deleted

## 2017-08-29 DIAGNOSIS — I495 Sick sinus syndrome: Secondary | ICD-10-CM

## 2017-08-29 NOTE — Telephone Encounter (Signed)
Confirmed remote transmission w/ pt wife.   

## 2017-08-29 NOTE — Progress Notes (Addendum)
Subjective:   Bradley Searles Sr. is a 76 y.o. male who presents for Medicare Annual/Subsequent preventive examination.  Review of Systems:  No ROS.  Medicare Wellness Visit. Additional risk factors are reflected in the social history.  Sleep patterns: Wakes 1-2x/ night to urinate. Sleeps 8 hrs. Takes Ambien. Naps during the day.  Lives with wife. Expecting granddaughter and boyfriend live with them.  Male:   CCS- last 03/10/11: normal. F/u as needed.  PSA-  Lab Results  Component Value Date   PSA 3.60 07/15/2015   PSA 3.50 01/14/2014   PSA 1.79 12/14/2010       Objective:    Vitals: BP (!) 102/58 (BP Location: Left Arm, Patient Position: Sitting, Cuff Size: Normal)   Pulse 72   Ht 5\' 9"  (1.753 m)   Wt 192 lb (87.1 kg)   SpO2 95%   BMI 28.35 kg/m   Body mass index is 28.35 kg/m.  Tobacco Social History   Tobacco Use  Smoking Status Former Smoker  . Packs/day: 2.50  . Years: 25.00  . Pack years: 62.50  . Types: Cigarettes  . Last attempt to quit: 10/11/1980  . Years since quitting: 36.9  Smokeless Tobacco Never Used  Tobacco Comment   used to smoke 2.5 ppd for 25 years     Counseling given: Not Answered Comment: used to smoke 2.5 ppd for 25 years   Past Medical History:  Diagnosis Date  . Abnormal CT scan, chest 2012   CT chest, several lymphadenopathies. Sees pulmonary  . Anemia    intermittent  . Arthritis    "?back" (08/22/2014)  . Atrial fibrillation (HCC)    s/p AV node ablation & BiV PPM implantation 09/08/11 (op dictation pending)  . BPH (benign prostatic hyperplasia)    Saw Dr [name] 2004, normal renal u/s  . Bronchitis 08/26/2017  . CAD (coronary artery disease)   . CHF (congestive heart failure) (HCC)    Thought primarily to be non-systolic although EF down (EF 14-70% 12/2010, down to 35-40% 09/05/11), cath 2008 with no CAD, nuclear study 07/2011 showing Small area of reversibility in the distal ant/lat wall the left ventricle suspicious  for ischemia/septal wall HK but felt to be low risk  (per D/C Summary 07/2011)  . Chronic bronchitis (HCC)    "get it ~ q yr"  . Heart murmur   . HLD (hyperlipidemia)   . HTN (hypertension)   . 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 Insomnia   . Migraines    "very very rare"  . On home oxygen therapy    at night as needed  . Pacemaker   . Peripheral vascular disease (HCC)    ??  . Pleural effusion 2008   S/p decortication  . Pulmonary HTN (HCC)    per cath 2008  . Type II diabetes mellitus (HCC) 1999   Past Surgical History:  Procedure Laterality Date  . BI-VENTRICULAR PACEMAKER INSERTION Left 09/08/2011   Procedure: BI-VENTRICULAR PACEMAKER INSERTION (CRT-P);  Surgeon: Marinus Maw, MD;  Location: [facility] CATH LAB;  Service: Cardiovascular;  Laterality: Left;  . CARDIAC CATHETERIZATION     "couple times; never had balloon or stent" (09/27/2012)  . COLONOSCOPY  03/10/11   normal  . INSERT / REPLACE / REMOVE PACEMAKER  09/08/11   pacemaker placement  . LUNG DECORTICATION    . PLEURAL SCARIFICATION    . pneumothorax with fibrothorax  ~ 2010  . TONSILLECTOMY     "  as a kid" (09/27/2012)   Family History  Problem Relation Age of Onset  . Diabetes Father   . Coronary artery disease Father   . Polycythemia Mother   . Drug abuse Son   . Breast cancer Maternal Aunt   . Prostate cancer Neg Hx   . Colon cancer Neg Hx    Social History   Substance and Sexual Activity  Sexual Activity Not Currently    Outpatient Encounter Medications as of 09/07/2017  Medication Sig  . amLODipine (NORVASC) 10 MG tablet Take 1 tablet (10 mg total) by mouth daily.  Marland Kitchen aspirin 81 MG tablet Take 81 mg by mouth every morning.   Marland Kitchen atorvastatin (LIPITOR) 20 MG tablet Take 1 tablet (20 mg total) by mouth at bedtime.  . calcium-vitamin D (OSCAL WITH D) 500-200 MG-UNIT tablet Take 1 tablet by mouth every morning.  . carvedilol (COREG) 25 MG tablet Take 25 mg by mouth 2  (two) times daily with a meal.  . glucose blood test strip Check blood sugar no more than twice daily  . Lancets (ONETOUCH ULTRASOFT) lancets Check blood sugars no more than twice daily  . losartan (COZAAR) 100 MG tablet Take 50 mg by mouth daily.  . Multiple Vitamins-Minerals (MULTIVITAMINS THER. W/MINERALS) TABS tablet Take 1 tablet by mouth daily.  . pantoprazole (PROTONIX) 40 MG tablet Take 1 tablet (40 mg total) by mouth every morning.  . sitaGLIPtin (JANUVIA) 100 MG tablet Take 1 tablet (100 mg total) by mouth daily.  . tamsulosin (FLOMAX) 0.4 MG CAPS capsule Take 1 capsule (0.4 mg total) by mouth daily.  Marland Kitchen torsemide (DEMADEX) 20 MG tablet Take 3 tablets (60 mg total) 2 (two) times daily by mouth.  . zolpidem (AMBIEN) 10 MG tablet Take 1 tablet (10 mg total) by mouth at bedtime as needed for sleep.  . Acetaminophen (TYLENOL PO) Take 1 tablet by mouth 2 (two) times daily as needed (pain).  . nitroGLYCERIN (NITROSTAT) 0.4 MG SL tablet DISSOLVE 1 TABLET UNDER THE TONGUE EVERY 5 MINUTES AS NEEDED FOR CHEST PAIN (Patient not taking: Reported on 07/05/2017)   No facility-administered encounter medications on file as of 09/07/2017.     Activities of Daily Living In your present state of health, do you have any difficulty performing the following activities: 09/07/2017 02/21/2017  Hearing? Y Y  Comment left hearing aid. [facility] -  Vision? N N  Comment wearing glasses. Eye exams yearly at [facility] -  Difficulty concentrating or making decisions? N N  Comment SOB w/exertion -  Walking or climbing stairs? N N  Dressing or bathing? N N  Doing errands, shopping? N N  Preparing Food and eating ? N -  Using the Toilet? N -  In the past six months, have you accidently leaked urine? N -  Do you have problems with loss of bowel control? N -  Managing your Medications? N -  Managing your Finances? N -  Housekeeping or managing your Housekeeping? N -  Some recent  data might be hidden    Patient Care Team: [name], MD as PCP - General (Internal Medicine) [name], MD as Referring Physician (Internal Medicine) Marinus Maw, MD as Consulting Physician (Cardiology) Nyoka Cowden, MD as Consulting Physician (Pulmonary Disease) Melvenia Beam, MD as Consulting Physician (Otolaryngology) Poudyal, Ritesh, OD (Optometry)   Assessment:    Physical assessment deferred to PCP.  Exercise Activities and Dietary recommendations Current Exercise Habits:  Home exercise routine, Type of exercise: walking, Time (Minutes): 30, Frequency (Times/Week): 2, Weekly Exercise (Minutes/Week): 60, Intensity: Mild   Diet (meal preparation, eat out, water intake, caffeinated beverages, dairy products, fruits and vegetables): well balanced   Goals    . Maintain current health (pt-stated)      Fall Risk Fall Risk  09/07/2017 08/26/2016 03/26/2016 03/12/2015 01/14/2014  Falls in the past year? No Yes Yes No No  Number falls in past yr: - 1 1 - -  Injury with Fall? - Yes Yes - -  Follow up - Falls evaluation completed Falls evaluation completed - -   Depression Screen PHQ 2/9 Scores 09/07/2017 08/26/2016 03/26/2016 03/12/2015  PHQ - 2 Score 0 0 0 0    Cognitive Function MMSE - Mini Mental State Exam 09/07/2017  Not completed: Refused   Ad8 score reviewed for issues:  Issues making decisions:no  Less interest in hobbies / activities:no  Repeats questions, stories (family complaining):no  Trouble using ordinary gadgets (microwave, computer, phone):no  Forgets the month or year: no  Mismanaging finances: no  Remembering appts:no  Daily problems with thinking and/or memory:no Ad8 score is=0      Immunization History  Administered Date(s) Administered  . Influenza Split 10/01/2011  . Influenza Whole 08/14/2007, 07/11/2009, 09/14/2010  . Influenza, High Dose Seasonal PF 09/14/2013, 07/15/2015, 07/09/2016, 07/05/2017  . Influenza,inj,Quad PF,6+ Mos  07/05/2014  . Influenza-Unspecified 08/11/2012  . Pneumococcal Conjugate-13 03/12/2015  . Pneumococcal Polysaccharide-23 07/11/2004, 03/30/2009  . Td 03/12/2003  . Tetanus 09/14/2013  . Zoster 04/20/2011   Screening Tests Health Maintenance  Topic Date Due  . OPHTHALMOLOGY EXAM  03/03/2017  . FOOT EXAM  08/26/2017  . HEMOGLOBIN A1C  01/02/2018  . TETANUS/TDAP  09/15/2023  . INFLUENZA VACCINE  Completed  . PNA vac Low Risk Adult  Completed      Plan:   Follow up with Dr.Paz as scheduled 11/01/17.  Eat heart healthy diet (full of fruits, vegetables, whole grains, lean protein, water--limit salt, fat, and sugar intake) and increase physical activity as tolerated.  Continue doing brain stimulating activities (puzzles, reading, adult coloring books, staying active) to keep memory sharp.   Bring a copy of your living will and/or healthcare power of attorney to your next office visit.  I have personally reviewed and noted the following in the patient's chart:   . Medical and social history . Use of alcohol, tobacco or illicit drugs  . Current medications and supplements . Functional ability and status . Nutritional status . Physical activity . Advanced directives . List of other physicians . Hospitalizations, surgeries, and ER visits in previous 12 months . Vitals . Screenings to include cognitive, depression, and falls . Referrals and appointments  In addition, I have reviewed and discussed with patient certain preventive protocols, quality metrics, and best practice recommendations. A written personalized care plan for preventive services as well as general preventive health recommendations were provided to patient.     Mady HaagensenBritt, Alfonzia Woolum HartstownAngel, [place]RN  09/07/2017  Willow OraJose Paz, MD

## 2017-08-30 NOTE — Progress Notes (Signed)
Remote pacemaker transmission.   

## 2017-08-31 LAB — CUP PACEART REMOTE DEVICE CHECK
Battery Remaining Longevity: 52 mo
Battery Voltage: 2.98 V
Brady Statistic AP VP Percent: 0 %
Brady Statistic AP VS Percent: 0 %
Brady Statistic AS VP Percent: 96.76 %
Brady Statistic AS VS Percent: 3.24 %
Brady Statistic RA Percent Paced: 0 %
Brady Statistic RV Percent Paced: 98.49 %
Date Time Interrogation Session: 20181120045238
Implantable Lead Implant Date: 20121128
Implantable Lead Implant Date: 20121128
Implantable Lead Location: 753858
Implantable Lead Location: 753860
Implantable Lead Model: 4194
Implantable Lead Model: 5076
Implantable Pulse Generator Implant Date: 20121128
Lead Channel Impedance Value: 342 Ohm
Lead Channel Impedance Value: 4047 Ohm
Lead Channel Impedance Value: 4047 Ohm
Lead Channel Impedance Value: 437 Ohm
Lead Channel Impedance Value: 456 Ohm
Lead Channel Impedance Value: 494 Ohm
Lead Channel Impedance Value: 551 Ohm
Lead Channel Impedance Value: 608 Ohm
Lead Channel Impedance Value: 627 Ohm
Lead Channel Pacing Threshold Amplitude: 0.625 V
Lead Channel Pacing Threshold Amplitude: 1.125 V
Lead Channel Pacing Threshold Pulse Width: 0.4 ms
Lead Channel Pacing Threshold Pulse Width: 0.4 ms
Lead Channel Sensing Intrinsic Amplitude: 5.125 mV
Lead Channel Sensing Intrinsic Amplitude: 5.125 mV
Lead Channel Setting Pacing Amplitude: 2 V
Lead Channel Setting Pacing Amplitude: 2.25 V
Lead Channel Setting Pacing Pulse Width: 0.4 ms
Lead Channel Setting Pacing Pulse Width: 0.4 ms
Lead Channel Setting Sensing Sensitivity: 4 mV

## 2017-09-07 ENCOUNTER — Encounter: Payer: Self-pay | Admitting: *Deleted

## 2017-09-07 ENCOUNTER — Ambulatory Visit (INDEPENDENT_AMBULATORY_CARE_PROVIDER_SITE_OTHER): Payer: Medicare Other | Admitting: *Deleted

## 2017-09-07 VITALS — BP 102/58 | HR 72 | Ht 69.0 in | Wt 192.0 lb

## 2017-09-07 DIAGNOSIS — Z0000 Encounter for general adult medical examination without abnormal findings: Secondary | ICD-10-CM | POA: Diagnosis not present

## 2017-09-07 NOTE — Patient Instructions (Signed)
Follow up with Dr.Paz as scheduled 11/01/17.  Eat heart healthy diet (full of fruits, vegetables, whole grains, lean protein, water--limit salt, fat, and sugar intake) and increase physical activity as tolerated.  Continue doing brain stimulating activities (puzzles, reading, adult coloring books, staying active) to keep memory sharp.   Bring a copy of your living will and/or healthcare power of attorney to your next office visit.   [name] , Thank you for taking time to come for your Medicare Wellness Visit. I appreciate your ongoing commitment to your health goals. Please review the following plan we discussed and let me know if I can assist you in the future.   These are the goals we discussed: Goals    . Maintain current health (pt-stated)       This is a list of the screening recommended for you and due dates:  Health Maintenance  Topic Date Due  . Eye exam for diabetics  03/03/2017  . Complete foot exam   08/26/2017  . Hemoglobin A1C  01/02/2018  . Tetanus Vaccine  09/15/2023  . Flu Shot  Completed  . Pneumonia vaccines  Completed    Health Maintenance, Male A healthy lifestyle and preventive care is important for your health and wellness. Ask your health care provider about what schedule of regular examinations is right for you. What should I know about weight and diet? Eat a Healthy Diet  Eat plenty of vegetables, fruits, whole grains, low-fat dairy products, and lean protein.  Do not eat a lot of foods high in solid fats, added sugars, or salt.  Maintain a Healthy Weight Regular exercise can help you achieve or maintain a healthy weight. You should:  Do at least 150 minutes of exercise each week. The exercise should increase your heart rate and make you sweat (moderate-intensity exercise).  Do strength-training exercises at least twice a week.  Watch Your Levels of Cholesterol and Blood Lipids  Have your blood tested for lipids and cholesterol every 5 years  starting at 76 years of age. If you are at high risk for heart disease, you should start having your blood tested when you are 76 years old. You may need to have your cholesterol levels checked more often if: ? Your lipid or cholesterol levels are high. ? You are older than 76 years of age. ? You are at high risk for heart disease.  What should I know about cancer screening? Many types of cancers can be detected early and may often be prevented. Lung Cancer  You should be screened every year for lung cancer if: ? You are a current smoker who has smoked for at least 30 years. ? You are a former smoker who has quit within the past 15 years.  Talk to your health care provider about your screening options, when you should start screening, and how often you should be screened.  Colorectal Cancer  Routine colorectal cancer screening usually begins at 76 years of age and should be repeated every 5-10 years until you are 76 years old. You may need to be screened more often if early forms of precancerous polyps or small growths are found. Your health care provider may recommend screening at an earlier age if you have risk factors for colon cancer.  Your health care provider may recommend using home test kits to check for hidden blood in the stool.  A small camera at the end of a tube can be used to examine your colon (sigmoidoscopy or colonoscopy).  This checks for the earliest forms of colorectal cancer.  Prostate and Testicular Cancer  Depending on your age and overall health, your health care provider may do certain tests to screen for prostate and testicular cancer.  Talk to your health care provider about any symptoms or concerns you have about testicular or prostate cancer.  Skin Cancer  Check your skin from head to toe regularly.  Tell your health care provider about any new moles or changes in moles, especially if: ? There is a change in a mole's size, shape, or color. ? You have a  mole that is larger than a pencil eraser.  Always use sunscreen. Apply sunscreen liberally and repeat throughout the day.  Protect yourself by wearing long sleeves, pants, a wide-brimmed hat, and sunglasses when outside.  What should I know about heart disease, diabetes, and high blood pressure?  If you are 82-90 years of age, have your blood pressure checked every 3-5 years. If you are 62 years of age or older, have your blood pressure checked every year. You should have your blood pressure measured twice-once when you are at a hospital or clinic, and once when you are not at a hospital or clinic. Record the average of the two measurements. To check your blood pressure when you are not at a hospital or clinic, you can use: ? An automated blood pressure machine at a pharmacy. ? A home blood pressure monitor.  Talk to your health care provider about your target blood pressure.  If you are between 77-59 years old, ask your health care provider if you should take aspirin to prevent heart disease.  Have regular diabetes screenings by checking your fasting blood sugar level. ? If you are at a normal weight and have a low risk for diabetes, have this test once every three years after the age of 11. ? If you are overweight and have a high risk for diabetes, consider being tested at a younger age or more often.  A one-time screening for abdominal aortic aneurysm (AAA) by ultrasound is recommended for men aged 65-75 years who are current or former smokers. What should I know about preventing infection? Hepatitis B If you have a higher risk for hepatitis B, you should be screened for this virus. Talk with your health care provider to find out if you are at risk for hepatitis B infection. Hepatitis C Blood testing is recommended for:  Everyone born from [birth year] through 1965.  Anyone with known risk factors for hepatitis C.  Sexually Transmitted Diseases (STDs)  You should be screened each year for  STDs including gonorrhea and chlamydia if: ? You are sexually active and are younger than 76 years of age. ? You are older than 76 years of age and your health care provider tells you that you are at risk for this type of infection. ? Your sexual activity has changed since you were last screened and you are at an increased risk for chlamydia or gonorrhea. Ask your health care provider if you are at risk.  Talk with your health care provider about whether you are at high risk of being infected with HIV. Your health care provider may recommend a prescription medicine to help prevent HIV infection.  What else can I do?  Schedule regular health, dental, and eye exams.  Stay current with your vaccines (immunizations).  Do not use any tobacco products, such as cigarettes, chewing tobacco, and e-cigarettes. If you need help quitting, ask your health care  provider.  Limit alcohol intake to no more than 2 drinks per day. One drink equals 12 ounces of beer, 5 ounces of wine, or 1 ounces of hard liquor.  Do not use street drugs.  Do not share needles.  Ask your health care provider for help if you need support or information about quitting drugs.  Tell your health care provider if you often feel depressed.  Tell your health care provider if you have ever been abused or do not feel safe at home. This information is not intended to replace advice given to you by your health care provider. Make sure you discuss any questions you have with your health care provider. Document Released: 03/25/2008 Document Revised: 05/26/2016 Document Reviewed: 07/01/2015 Elsevier Interactive Patient Education  [employer].

## 2017-09-08 ENCOUNTER — Encounter: Payer: Self-pay | Admitting: Cardiology

## 2017-09-27 ENCOUNTER — Other Ambulatory Visit: Payer: Self-pay | Admitting: Internal Medicine

## 2017-11-01 ENCOUNTER — Ambulatory Visit (INDEPENDENT_AMBULATORY_CARE_PROVIDER_SITE_OTHER): Payer: Medicare Other | Admitting: Internal Medicine

## 2017-11-01 ENCOUNTER — Encounter: Payer: Self-pay | Admitting: Internal Medicine

## 2017-11-01 VITALS — BP 98/62 | HR 90 | Temp 97.6°F | Wt 200.6 lb

## 2017-11-01 DIAGNOSIS — I10 Essential (primary) hypertension: Secondary | ICD-10-CM

## 2017-11-01 DIAGNOSIS — I5030 Unspecified diastolic (congestive) heart failure: Secondary | ICD-10-CM

## 2017-11-01 DIAGNOSIS — Z23 Encounter for immunization: Secondary | ICD-10-CM

## 2017-11-01 DIAGNOSIS — E785 Hyperlipidemia, unspecified: Secondary | ICD-10-CM

## 2017-11-01 DIAGNOSIS — I110 Hypertensive heart disease with heart failure: Secondary | ICD-10-CM

## 2017-11-01 DIAGNOSIS — E1159 Type 2 diabetes mellitus with other circulatory complications: Secondary | ICD-10-CM

## 2017-11-01 DIAGNOSIS — I25810 Atherosclerosis of coronary artery bypass graft(s) without angina pectoris: Secondary | ICD-10-CM

## 2017-11-01 LAB — COMPREHENSIVE METABOLIC PANEL
ALT: 15 U/L (ref 0–53)
AST: 18 U/L (ref 0–37)
Albumin: 3.7 g/dL (ref 3.5–5.2)
Alkaline Phosphatase: 94 U/L (ref 39–117)
BUN: 16 mg/dL (ref 6–23)
CO2: 32 mEq/L (ref 19–32)
Calcium: 8.8 mg/dL (ref 8.4–10.5)
Chloride: 101 mEq/L (ref 96–112)
Creatinine, Ser: 0.85 mg/dL (ref 0.40–1.50)
GFR: 92.97 mL/min (ref >60.00)
Glucose, Bld: 125 mg/dL — ABNORMAL HIGH (ref 70–99)
Potassium: 4 mEq/L (ref 3.5–5.1)
Sodium: 139 mEq/L (ref 135–145)
Total Bilirubin: 0.8 mg/dL (ref 0.2–1.2)
Total Protein: 6.9 g/dL (ref 6.0–8.3)

## 2017-11-01 LAB — LIPID PANEL
Cholesterol: 130 mg/dL (ref 0–200)
HDL: 35.6 mg/dL — ABNORMAL LOW (ref >39.00)
LDL Cholesterol: 74 mg/dL (ref 0–99)
NonHDL: 94.19
Total CHOL/HDL Ratio: 4
Triglycerides: 103 mg/dL (ref 0.0–149.0)
VLDL: 20.6 mg/dL (ref 0.0–40.0)

## 2017-11-01 LAB — CBC WITH DIFFERENTIAL/PLATELET
Basophils Absolute: 0 10*3/uL (ref 0.0–0.1)
Basophils Relative: 0.5 % (ref 0.0–3.0)
Eosinophils Absolute: 0.1 10*3/uL (ref 0.0–0.7)
Eosinophils Relative: 1.5 % (ref 0.0–5.0)
HCT: 42.6 % (ref 39.0–52.0)
Hemoglobin: 14.6 g/dL (ref 13.0–17.0)
Lymphocytes Relative: 22.6 % (ref 12.0–46.0)
Lymphs Abs: 1.5 10*3/uL (ref 0.7–4.0)
MCHC: 34.3 g/dL (ref 30.0–36.0)
MCV: 90.6 fl (ref 78.0–100.0)
Monocytes Absolute: 0.6 10*3/uL (ref 0.1–1.0)
Monocytes Relative: 8.8 % (ref 3.0–12.0)
Neutro Abs: 4.3 10*3/uL (ref 1.4–7.7)
Neutrophils Relative %: 66.6 % (ref 43.0–77.0)
Platelets: 214 10*3/uL (ref 150.0–400.0)
RBC: 4.7 Mil/uL (ref 4.22–5.81)
RDW: 14.2 % (ref 11.5–15.5)
WBC: 6.5 10*3/uL (ref 4.0–10.5)

## 2017-11-01 LAB — TSH: TSH: 1.76 u[IU]/mL (ref 0.35–4.50)

## 2017-11-01 LAB — HEMOGLOBIN A1C: Hgb A1c MFr Bld: 6.2 % (ref 4.6–6.5)

## 2017-11-01 NOTE — Progress Notes (Signed)
Pre visit review using our clinic review tool, if applicable. No additional management support is needed unless otherwise documented below in the visit note. 

## 2017-11-01 NOTE — Patient Instructions (Addendum)
GO TO THE LAB : Get the blood work     GO TO THE FRONT DESK Schedule your next appointment for a checkup in 4-5 months   Check the  blood pressure 2   times a day as you are doing Be sure your blood pressure is between 110/65 and  135/85. If it is consistently higher or lower, let me know  If our blood pressure is less than 100: Don't take amlodipine and losartan t\hat day Take 3  torsemide only once  that day.

## 2017-11-01 NOTE — Assessment & Plan Note (Signed)
He had a flu shot today.   Pneumonia 23 immunization booster today. Given age and comorbidities I do not recommend further screening for colon or prostate cancer, patient totally agreed with me.

## 2017-11-01 NOTE — Progress Notes (Signed)
Subjective:    Patient ID: Bradley Sit., male    DOB: 1941-09-11, 77 y.o.   MRN: [id number]  DOS:  11/01/2017 Type of visit - description :  Interval history: DM: Good compliance with medication, ambulatory CBGs never more than 130 HTN: Good compliance with medication, ambulatory BPs checked twice a day, they usually run 124/82.  BP at the time of the office visit is low but he is completely asymptomatic COPD hypoxia: Essentially asymptomatic, doing well, he is here today without his portable oxygen, O2 sat ranged from the high 80s to 90.  He is feeling well this morning.   BP Readings from Last 3 Encounters:  11/01/17 98/62  09/07/17 (!) 102/58  08/26/17 (!) 90/50   Wt Readings from Last 3 Encounters:  11/01/17 200 lb 9.6 oz (91 kg)  09/07/17 192 lb (87.1 kg)  08/26/17 191 lb (86.6 kg)     Review of Systems  Denies chest pain, lower extremity edema No nausea, vomiting, diarrhea Past Medical History:  Diagnosis Date  . Abnormal CT scan, chest 2012   CT chest, several lymphadenopathies. Sees pulmonary  . Anemia    intermittent  . Arthritis    "?back" (08/22/2014)  . Atrial fibrillation (HCC)    s/p AV node ablation & BiV PPM implantation 09/08/11 (op dictation pending)  . BPH (benign prostatic hyperplasia)    Saw Dr Bradley [name] 2004, normal renal u/s  . Bronchitis 08/26/2017  . CAD (coronary artery disease)   . CHF (congestive heart failure) (HCC)    Thought primarily to be non-systolic although EF down (EF 47-82% 12/2010, down to 35-40% 09/05/11), cath 2008 with no CAD, nuclear study 07/2011 showing Small area of reversibility in the distal ant/lat wall the left ventricle suspicious for ischemia/septal wall HK but felt to be low risk  (per D/C Summary 07/2011)  . Chronic bronchitis (HCC)    "get it ~ q yr"  . Heart murmur   . HLD (hyperlipidemia)   . HTN (hypertension)   . 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 Insomnia   . Migraines     "very very rare"  . On home oxygen therapy    at night as needed  . Pacemaker   . Peripheral vascular disease (HCC)    ??  . Pleural effusion 2008   S/p decortication  . Pulmonary HTN (HCC)    per cath 2008  . Type II diabetes mellitus (HCC) 1999    Past Surgical History:  Procedure Laterality Date  . BI-VENTRICULAR PACEMAKER INSERTION Left 09/08/2011   Procedure: BI-VENTRICULAR PACEMAKER INSERTION (CRT-P);  Surgeon: Bradley Maw, MD;  Location: [facility] CATH LAB;  Service: Cardiovascular;  Laterality: Left;  . CARDIAC CATHETERIZATION     "couple times; never had balloon or stent" (09/27/2012)  . COLONOSCOPY  03/10/11   normal  . INSERT / REPLACE / REMOVE PACEMAKER  09/08/11   pacemaker placement  . LUNG DECORTICATION    . PLEURAL SCARIFICATION    . pneumothorax with fibrothorax  ~ 2010  . TONSILLECTOMY     "as a kid" (09/27/2012)    Social History   Socioeconomic History  . Marital status: Married    Spouse name: Bradley [name]  . Number of children: 1  . Years of education: Not on file  . Highest education level: Not on file  Social Needs  . Financial resource strain: Not on file  . Food insecurity - worry: Not on file  .  Food insecurity - inability: Not on file  . Transportation needs - medical: Not on file  . Transportation needs - non-medical: Not on file  Occupational History  . Occupation: retired Publishing rights manager: RETIRED  Tobacco Use  . Smoking status: Former Smoker    Packs/day: 2.50    Years: 25.00    Pack years: 62.50    Types: Cigarettes    Last attempt to quit: 10/11/1980    Years since quitting: 37.0  . Smokeless tobacco: Never Used  . Tobacco comment: used to smoke 2.5 ppd for 25 years  Substance and Sexual Activity  . Alcohol use: No    Alcohol/week: 0.0 oz    Comment: 08/22/2014 "maybe q couple months I'll have a beer w/dinner"  . Drug use: No  . Sexual activity: Not Currently  Other Topics Concern  . Not on file  Social History  Narrative   Moved from [place] 2003.[place]   He lives w/ his wife, Bradley [name]   1 son, substance abuse, passed away October 14, 2016            Allergies as of 11/01/2017      Reactions   Hydrocodone Other (See Comments)   "given to him in the hospital; went thru withdrawals once home; dr said not to take it again" (09/27/2012)   Tramadol Other (See Comments)   "given to him in the hospital; went thru withdrawals once home; dr said not to take it again" (09/27/2012)   Tadalafil Other (See Comments)   headache, backache   Avelox [moxifloxacin Hydrochloride] Itching, Other (See Comments)   Headache      Medication List        Accurate as of 11/01/17  5:18 PM. Always use your most recent med list.          amLODipine 10 MG tablet Commonly known as:  NORVASC Take 1 tablet (10 mg total) by mouth daily.   aspirin 81 MG tablet Take 81 mg by mouth every morning.   atorvastatin 20 MG tablet Commonly known as:  LIPITOR Take 1 tablet (20 mg total) by mouth at bedtime.   calcium-vitamin D 500-200 MG-UNIT tablet Commonly known as:  OSCAL WITH D Take 1 tablet by mouth every morning.   carvedilol 25 MG tablet Commonly known as:  COREG Take 25 mg by mouth 2 (two) times daily with a meal.   glucose blood test strip Check blood sugar no more than twice daily   losartan 100 MG tablet Commonly known as:  COZAAR Take 0.5 tablets (50 mg total) by mouth daily.   multivitamins ther. w/minerals Tabs tablet Take 1 tablet by mouth daily.   nitroGLYCERIN 0.4 MG SL tablet Commonly known as:  NITROSTAT DISSOLVE 1 TABLET UNDER THE TONGUE EVERY 5 MINUTES AS NEEDED FOR CHEST PAIN   onetouch ultrasoft lancets Check blood sugars no more than twice daily   pantoprazole 40 MG tablet Commonly known as:  PROTONIX Take 1 tablet (40 mg total) by mouth every morning.   sitaGLIPtin 100 MG tablet Commonly known as:  JANUVIA Take 1 tablet (100 mg total) by mouth daily.   tamsulosin 0.4 MG Caps capsule Commonly  known as:  FLOMAX Take 1 capsule (0.4 mg total) by mouth daily.   torsemide 20 MG tablet Commonly known as:  DEMADEX Take 3 tablets (60 mg total) 2 (two) times daily by mouth.   zolpidem 10 MG tablet Commonly known as:  AMBIEN Take 1 tablet (10 mg total) by  mouth at bedtime as needed for sleep.          Objective:   Physical Exam BP 98/62 (BP Location: Left Arm, Patient Position: Sitting, Cuff Size: Normal)   Pulse 90   Temp 97.6 F (36.4 C) (Oral)   Wt 200 lb 9.6 oz (91 kg)   SpO2 90%   BMI 29.62 kg/m  General:   Well developed, well nourished . NAD.  HEENT:  Normocephalic . Face symmetric, atraumatic Neck: No JVD  lungs:  Decreased breath sounds but clear Normal respiratory effort, no intercostal retractions, no accessory muscle use. Heart: Seems regular,  no murmur.  no pretibial edema bilaterally  DIABETIC FEET EXAM: No lower extremity edema Normal pedal pulses bilaterally Skin is dry; nails thick, few calluses Pinprick examination of the feet normal. Abdomen:  Not distended, soft, non-tender. No rebound or rigidity.   Skin: Not pale. Not jaundice Neurologic:  alert & oriented X3.  Speech normal, gait appropriate for age and unassisted Psych--  Cognition and judgment appear intact.  Cooperative with normal attention span and concentration.  Behavior appropriate. No anxious or depressed appearing.     Assessment & Plan:   Assessment  DM HTN Hyperlipidemia Anxiety- insomnia  BPH Cardiovascular:Dr Ladona Ridgel  --CAD -- CHF --Atrial fibrillation;  --Pacemaker --h/o IC bleeding: Not anticoagulated --Peripheral vascular disease? Pulmonary: Dr [name], last OV 10-18-2014, f/u prn --COPD, severe, chronic resp failure, night O2 prn, has portable O2  --Pleural effusion s/p decortication --Pulmonary hypertension  Stasis dermatitis   PLAN: DM: Currently on Januvia, CBGs never more than 130, checking A1c, stop Januvia?.  Feet exam negative HTN, CAD, CHF:  Currently on carvedilol, losartan, Demadex.  BP noted to be low, patient asx today. Last visit with cardiology 08/26/2017, BP was also noted to be low , was Rx  to hold amlodipine /losartan and decrease torsemide dose to 3 tabs qd if SBP less than 100. At this point BPs are very good at home, he seems to be tolerating low BP today  very well.  Gained a few pounds but no evidence of volume overload.  Plan :  Continue present care, hold medicines as rx , see AVS; check a CMP, CBC, TSH. High cholesterol: Patient fasting, on Lipitor, checking labs COPD: O2 sat 90 today on no oxygen, rec to use O2   Preventive care issues discussed. RTC 4-5 months

## 2017-11-01 NOTE — Assessment & Plan Note (Signed)
DM: Currently on Januvia, CBGs never more than 130, checking A1c, stop Januvia?.  Feet exam negative HTN, CAD, CHF: Currently on carvedilol, losartan, Demadex.  BP noted to be low, patient asx today. Last visit with cardiology 08/26/2017, BP was also noted to be low , was Rx  to hold amlodipine /losartan and decrease torsemide dose to 3 tabs qd if SBP less than 100. At this point BPs are very good at home, he seems to be tolerating low BP today  very well.  Gained a few pounds but no evidence of volume overload.  Plan :  Continue present care, hold medicines as rx , see AVS; check a CMP, CBC, TSH. High cholesterol: Patient fasting, on Lipitor, checking labs COPD: O2 sat 90 today on no oxygen, rec to use O2   Preventive care issues discussed. RTC 4-5 months

## 2017-11-02 ENCOUNTER — Other Ambulatory Visit: Payer: Self-pay | Admitting: Internal Medicine

## 2017-11-11 ENCOUNTER — Other Ambulatory Visit: Payer: Self-pay | Admitting: Internal Medicine

## 2017-11-26 ENCOUNTER — Other Ambulatory Visit: Payer: Self-pay | Admitting: Internal Medicine

## 2017-11-28 ENCOUNTER — Ambulatory Visit (INDEPENDENT_AMBULATORY_CARE_PROVIDER_SITE_OTHER): Payer: Medicare Other | Admitting: *Deleted

## 2017-11-28 ENCOUNTER — Telehealth: Payer: Self-pay | Admitting: Cardiovascular Disease

## 2017-11-28 DIAGNOSIS — I495 Sick sinus syndrome: Secondary | ICD-10-CM | POA: Diagnosis not present

## 2017-11-28 NOTE — Telephone Encounter (Signed)
Pt called back. Informed scheduler to tell pt that his remote transmission was received.

## 2017-11-28 NOTE — Telephone Encounter (Signed)
New Message  ° ° ° ° ° °1. Has your device fired? no ° °2. Is you device beeping? no ° °3. Are you experiencing draining or swelling at device site?  no ° °4. Are you calling to see if we received your device transmission? Yes  ° °5. Have you passed out? no ° ° ° °Please route to Device Clinic Pool ° °

## 2017-11-28 NOTE — Progress Notes (Signed)
Remote pacemaker transmission.   

## 2017-11-29 LAB — CUP PACEART REMOTE DEVICE CHECK
Battery Remaining Longevity: 51 mo
Battery Voltage: 2.98 V
Brady Statistic AP VP Percent: 0 %
Brady Statistic AP VS Percent: 0 %
Brady Statistic AS VP Percent: 97.47 %
Brady Statistic AS VS Percent: 2.53 %
Brady Statistic RA Percent Paced: 0 %
Brady Statistic RV Percent Paced: 98.51 %
Date Time Interrogation Session: 20190218194951
Implantable Lead Implant Date: 20121128
Implantable Lead Implant Date: 20121128
Implantable Lead Location: 753858
Implantable Lead Location: 753860
Implantable Lead Model: 4194
Implantable Lead Model: 5076
Implantable Pulse Generator Implant Date: 20121128
Lead Channel Impedance Value: 342 Ohm
Lead Channel Impedance Value: 4047 Ohm
Lead Channel Impedance Value: 4047 Ohm
Lead Channel Impedance Value: 418 Ohm
Lead Channel Impedance Value: 437 Ohm
Lead Channel Impedance Value: 475 Ohm
Lead Channel Impedance Value: 532 Ohm
Lead Channel Impedance Value: 551 Ohm
Lead Channel Impedance Value: 589 Ohm
Lead Channel Pacing Threshold Amplitude: 0.625 V
Lead Channel Pacing Threshold Amplitude: 0.875 V
Lead Channel Pacing Threshold Pulse Width: 0.4 ms
Lead Channel Pacing Threshold Pulse Width: 0.4 ms
Lead Channel Sensing Intrinsic Amplitude: 5.125 mV
Lead Channel Sensing Intrinsic Amplitude: 5.125 mV
Lead Channel Setting Pacing Amplitude: 2 V
Lead Channel Setting Pacing Amplitude: 2 V
Lead Channel Setting Pacing Pulse Width: 0.4 ms
Lead Channel Setting Pacing Pulse Width: 0.4 ms
Lead Channel Setting Sensing Sensitivity: 4 mV

## 2017-11-30 ENCOUNTER — Encounter: Payer: Self-pay | Admitting: Cardiology

## 2017-12-08 ENCOUNTER — Other Ambulatory Visit: Payer: Self-pay | Admitting: Internal Medicine

## 2017-12-14 LAB — HM DIABETES EYE EXAM

## 2017-12-15 ENCOUNTER — Encounter: Payer: Self-pay | Admitting: Internal Medicine

## 2017-12-18 ENCOUNTER — Other Ambulatory Visit: Payer: Self-pay | Admitting: Internal Medicine

## 2018-01-09 ENCOUNTER — Telehealth: Payer: Self-pay | Admitting: Internal Medicine

## 2018-01-09 NOTE — Telephone Encounter (Signed)
Copied from CRM [id number]. Topic: Quick Communication - Rx Refill/Question >> Jan 09, 2018  4:58 PM Clack, Princella Pellegrini wrote: Medication: zolpidem (AMBIEN) 10 MG tablet [[id number]]  Has the patient contacted their pharmacy? Yes.   (Agent: If no, request that the patient contact the pharmacy for the refill.) Preferred Pharmacy (with phone number or street name): [facility] - [place], [place] - [address] [phone] (Phone) [phone] (Fax)  Agent: Please be advised that RX refills may take up to 3 business days. We ask that you follow-up with your pharmacy.

## 2018-01-10 MED ORDER — ZOLPIDEM TARTRATE 10 MG PO TABS: 10.0000 mg | ORAL_TABLET | Freq: Every evening | ORAL | 1 refills | Status: DC | PRN

## 2018-01-10 NOTE — Telephone Encounter (Signed)
Pt is requesting refill on Ambien.   Last OV: 11/01/2017 Last Fill: 07/05/2018 #90 and 1RF UDS: Not needed for Ambien per PCP  NCCR printed- no descrepancies noted- sent for scanning  Please advise.

## 2018-01-10 NOTE — Telephone Encounter (Addendum)
Ambien LOV: 07/05/17 PCP: Dr Drue Novel Pharmacy: [facility]

## 2018-01-10 NOTE — Telephone Encounter (Signed)
Sent!

## 2018-02-18 ENCOUNTER — Other Ambulatory Visit: Payer: Self-pay | Admitting: Internal Medicine

## 2018-02-27 ENCOUNTER — Ambulatory Visit (INDEPENDENT_AMBULATORY_CARE_PROVIDER_SITE_OTHER): Payer: Medicare Other | Admitting: *Deleted

## 2018-02-27 ENCOUNTER — Telehealth: Payer: Self-pay | Admitting: Internal Medicine

## 2018-02-27 DIAGNOSIS — I509 Heart failure, unspecified: Secondary | ICD-10-CM | POA: Diagnosis not present

## 2018-02-27 NOTE — Telephone Encounter (Signed)
Spoke w/ pt and informed him that his remote transmission was received.  

## 2018-02-27 NOTE — Progress Notes (Signed)
Remote pacemaker transmission.   

## 2018-02-27 NOTE — Telephone Encounter (Signed)
New message ° ° ° ° °1. Has your device fired? NO ° °2. Is you device beeping? NO ° °3. Are you experiencing draining or swelling at device site? NO ° °4. Are you calling to see if we received your device transmission? YES ° °5. Have you passed out? NO ° ° ° °Please route to Device Clinic Pool °

## 2018-02-28 LAB — CUP PACEART REMOTE DEVICE CHECK
Battery Remaining Longevity: 47 mo
Battery Voltage: 2.97 V
Brady Statistic AP VP Percent: 0 %
Brady Statistic AP VS Percent: 0 %
Brady Statistic AS VP Percent: 96.34 %
Brady Statistic AS VS Percent: 3.66 %
Brady Statistic RA Percent Paced: 0 %
Brady Statistic RV Percent Paced: 98.05 %
Date Time Interrogation Session: 20190520175634
Implantable Lead Implant Date: 20121128
Implantable Lead Implant Date: 20121128
Implantable Lead Location: 753858
Implantable Lead Location: 753860
Implantable Lead Model: 4194
Implantable Lead Model: 5076
Implantable Pulse Generator Implant Date: 20121128
Lead Channel Impedance Value: 323 Ohm
Lead Channel Impedance Value: 4047 Ohm
Lead Channel Impedance Value: 4047 Ohm
Lead Channel Impedance Value: 418 Ohm
Lead Channel Impedance Value: 437 Ohm
Lead Channel Impedance Value: 494 Ohm
Lead Channel Impedance Value: 551 Ohm
Lead Channel Impedance Value: 570 Ohm
Lead Channel Impedance Value: 570 Ohm
Lead Channel Pacing Threshold Amplitude: 0.875 V
Lead Channel Pacing Threshold Amplitude: 1 V
Lead Channel Pacing Threshold Pulse Width: 0.4 ms
Lead Channel Pacing Threshold Pulse Width: 0.4 ms
Lead Channel Sensing Intrinsic Amplitude: 5.125 mV
Lead Channel Sensing Intrinsic Amplitude: 5.125 mV
Lead Channel Setting Pacing Amplitude: 2 V
Lead Channel Setting Pacing Amplitude: 2.25 V
Lead Channel Setting Pacing Pulse Width: 0.4 ms
Lead Channel Setting Pacing Pulse Width: 0.4 ms
Lead Channel Setting Sensing Sensitivity: 4 mV

## 2018-03-07 ENCOUNTER — Telehealth: Payer: Self-pay | Admitting: Internal Medicine

## 2018-03-07 NOTE — Telephone Encounter (Signed)
Copied from CRM [id number]. Topic: Quick Communication - Rx Refill/Question >> Mar 07, 2018  4:48 PM Louie Bun, Rosey Bath D wrote: Medication: glucose blood test strip the onetouch  Has the patient contacted their pharmacy? Yes (Agent: If no, request that the patient contact the pharmacy for the refill.) (Agent: If yes, when and what did the pharmacy advise?)  Preferred Pharmacy (with phone number or street name): [facility] - [place], [place] - [address]  Agent: Please be advised that RX refills may take up to 3 business days. We ask that you follow-up with your pharmacy.

## 2018-03-08 ENCOUNTER — Other Ambulatory Visit: Payer: Self-pay | Admitting: Internal Medicine

## 2018-03-08 NOTE — Telephone Encounter (Signed)
Medication filled on 5/29  

## 2018-04-03 ENCOUNTER — Encounter: Payer: Self-pay | Admitting: Internal Medicine

## 2018-04-03 ENCOUNTER — Ambulatory Visit (INDEPENDENT_AMBULATORY_CARE_PROVIDER_SITE_OTHER): Payer: Medicare Other | Admitting: Internal Medicine

## 2018-04-03 VITALS — BP 108/74 | HR 91 | Temp 98.2°F | Resp 16 | Ht 69.0 in | Wt 202.2 lb

## 2018-04-03 DIAGNOSIS — E1159 Type 2 diabetes mellitus with other circulatory complications: Secondary | ICD-10-CM | POA: Diagnosis not present

## 2018-04-03 DIAGNOSIS — E785 Hyperlipidemia, unspecified: Secondary | ICD-10-CM | POA: Diagnosis not present

## 2018-04-03 DIAGNOSIS — I10 Essential (primary) hypertension: Secondary | ICD-10-CM | POA: Diagnosis not present

## 2018-04-03 DIAGNOSIS — I25810 Atherosclerosis of coronary artery bypass graft(s) without angina pectoris: Secondary | ICD-10-CM

## 2018-04-03 LAB — BASIC METABOLIC PANEL
BUN: 16 mg/dL (ref 6–23)
CO2: 30 mEq/L (ref 19–32)
Calcium: 9 mg/dL (ref 8.4–10.5)
Chloride: 102 mEq/L (ref 96–112)
Creatinine, Ser: 0.95 mg/dL (ref 0.40–1.50)
GFR: 81.68 mL/min (ref >60.00)
Glucose, Bld: 132 mg/dL — ABNORMAL HIGH (ref 70–99)
Potassium: 4.2 mEq/L (ref 3.5–5.1)
Sodium: 143 mEq/L (ref 135–145)

## 2018-04-03 LAB — HEMOGLOBIN A1C: Hgb A1c MFr Bld: 6.6 % — ABNORMAL HIGH (ref 4.6–6.5)

## 2018-04-03 NOTE — Progress Notes (Signed)
Pre visit review using our clinic review tool, if applicable. No additional management support is needed unless otherwise documented below in the visit note. 

## 2018-04-03 NOTE — Patient Instructions (Signed)
GO TO THE LAB : Get the blood work     GO TO THE FRONT DESK Schedule your next appointment for a  Check up in 4 months    Check the  blood pressure  weekly or more frequent  Be sure your blood pressure is between 110/65 and  135/85. If it is consistently higher or lower, let me know

## 2018-04-03 NOTE — Assessment & Plan Note (Signed)
DM: Currently on Januvia, last A1c 6.2, recheck today. HTN: Seems controlled on amlodipine, carvedilol, losartan (take half tablet), Demadex.  Check a BMP. BP today slt low but asx  High cholesterol: On Lipitor, last FLP excellent. COPD: Encourage use of oxygen as prescribed. RTC 4 months

## 2018-04-03 NOTE — Progress Notes (Signed)
Subjective:    Patient ID: Bradley Sit., male    DOB: 12-May-1941, 77 y.o.   MRN: [id number]  DOS:  04/03/2018 Type of visit - description : rov Interval history: Since the last office visit, he is doing well. No recent ambulatory CBGs BP today slightly low, he is asymptomatic, reports "normal" ambulatory BPs.  Wt Readings from Last 3 Encounters:  04/03/18 202 lb 4 oz (91.7 kg)  11/01/17 200 lb 9.6 oz (91 kg)  09/07/17 192 lb (87.1 kg)     Review of Systems  Denies chest pain, lower extremity edema. No cough No DOE with ADLs. Not using his oxygen consistently.  Past Medical History:  Diagnosis Date  . Abnormal CT scan, chest 2012   CT chest, several lymphadenopathies. Sees pulmonary  . Anemia    intermittent  . Arthritis    "?back" (08/22/2014)  . Atrial fibrillation (HCC)    s/p AV node ablation & BiV PPM implantation 09/08/11 (op dictation pending)  . BPH (benign prostatic hyperplasia)    Saw Dr [name] 2004, normal renal u/s  . Bronchitis 08/26/2017  . CAD (coronary artery disease)   . CHF (congestive heart failure) (HCC)    Thought primarily to be non-systolic although EF down (EF 65-79% 12/2010, down to 35-40% 09/05/11), cath 2008 with no CAD, nuclear study 07/2011 showing Small area of reversibility in the distal ant/lat wall the left ventricle suspicious for ischemia/septal wall HK but felt to be low risk  (per D/C Summary 07/2011)  . Chronic bronchitis (HCC)    "get it ~ q yr"  . Heart murmur   . HLD (hyperlipidemia)   . HTN (hypertension)   . 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 Insomnia   . Migraines    "very very rare"  . On home oxygen therapy    at night as needed  . Pacemaker   . Peripheral vascular disease (HCC)    ??  . Pleural effusion 2008   S/p decortication  . Pulmonary HTN (HCC)    per cath 2008  . Type II diabetes mellitus (HCC) 1999    Past Surgical History:  Procedure Laterality Date  .  BI-VENTRICULAR PACEMAKER INSERTION Left 09/08/2011   Procedure: BI-VENTRICULAR PACEMAKER INSERTION (CRT-P);  Surgeon: Marinus Maw, MD;  Location: [facility] CATH LAB;  Service: Cardiovascular;  Laterality: Left;  . CARDIAC CATHETERIZATION     "couple times; never had balloon or stent" (09/27/2012)  . COLONOSCOPY  03/10/11   normal  . INSERT / REPLACE / REMOVE PACEMAKER  09/08/11   pacemaker placement  . LUNG DECORTICATION    . PLEURAL SCARIFICATION    . pneumothorax with fibrothorax  ~ 2010  . TONSILLECTOMY     "as a kid" (09/27/2012)    Social History   Socioeconomic History  . Marital status: Married    Spouse name: [name]  . Number of children: 1  . Years of education: Not on file  . Highest education level: Not on file  Occupational History  . Occupation: retired Publishing rights manager: RETIRED  Social Needs  . Financial resource strain: Not on file  . Food insecurity:    Worry: Not on file    Inability: Not on file  . Transportation needs:    Medical: Not on file    Non-medical: Not on file  Tobacco Use  . Smoking status: Former Smoker    Packs/day: 2.50  Years: 25.00    Pack years: 62.50    Types: Cigarettes    Last attempt to quit: 10/11/1980    Years since quitting: 37.5  . Smokeless tobacco: Never Used  . Tobacco comment: used to smoke 2.5 ppd for 25 years  Substance and Sexual Activity  . Alcohol use: No    Alcohol/week: 0.0 oz    Comment: 08/22/2014 "maybe q couple months I'll have a beer w/dinner"  . Drug use: No  . Sexual activity: Not Currently  Lifestyle  . Physical activity:    Days per week: Not on file    Minutes per session: Not on file  . Stress: Not on file  Relationships  . Social connections:    Talks on phone: Not on file    Gets together: Not on file    Attends religious service: Not on file    Active member of club or organization: Not on file    Attends meetings of clubs or organizations: Not on file    Relationship status:  Not on file  . Intimate partner violence:    Fear of current or ex partner: Not on file    Emotionally abused: Not on file    Physically abused: Not on file    Forced sexual activity: Not on file  Other Topics Concern  . Not on file  Social History Narrative   Moved from [place] 2003.[place]   He lives w/ his wife, [name]   1 son, substance abuse, passed away 12-Oct-2016            Allergies as of 04/03/2018      Reactions   Hydrocodone Other (See Comments)   "given to him in the hospital; went thru withdrawals once home; dr said not to take it again" (09/27/2012)   Tramadol Other (See Comments)   "given to him in the hospital; went thru withdrawals once home; dr said not to take it again" (09/27/2012)   Tadalafil Other (See Comments)   headache, backache   Avelox [moxifloxacin Hydrochloride] Itching, Other (See Comments)   Headache      Medication List        Accurate as of 04/03/18  5:03 PM. Always use your most recent med list.          amLODipine 10 MG tablet Commonly known as:  NORVASC Take 1 tablet (10 mg total) by mouth daily.   aspirin 81 MG tablet Take 81 mg by mouth every morning.   atorvastatin 20 MG tablet Commonly known as:  LIPITOR Take 1 tablet (20 mg total) by mouth at bedtime.   calcium-vitamin D 500-200 MG-UNIT tablet Commonly known as:  OSCAL WITH D Take 1 tablet by mouth every morning.   carvedilol 25 MG tablet Commonly known as:  COREG Take 1 tablet (25 mg total) by mouth 2 (two) times daily with a meal.   losartan 100 MG tablet Commonly known as:  COZAAR Take 0.5 tablets (50 mg total) by mouth daily.   multivitamins ther. w/minerals Tabs tablet Take 1 tablet by mouth daily.   nitroGLYCERIN 0.4 MG SL tablet Commonly known as:  NITROSTAT DISSOLVE 1 TABLET UNDER THE TONGUE EVERY 5 MINUTES AS NEEDED FOR CHEST PAIN   onetouch ultrasoft lancets Check blood sugars no more than twice daily   ONETOUCH VERIO test strip Generic drug:  glucose  blood CHECK BLOOD SUGAR NO MORE THAN TWICE A DAY   pantoprazole 40 MG tablet Commonly known as:  PROTONIX Take 1 tablet (  40 mg total) by mouth every morning.   sitaGLIPtin 100 MG tablet Commonly known as:  JANUVIA Take 1 tablet (100 mg total) by mouth daily.   tamsulosin 0.4 MG Caps capsule Commonly known as:  FLOMAX Take 1 capsule (0.4 mg total) by mouth daily.   torsemide 20 MG tablet Commonly known as:  DEMADEX Take 3 tablets (60 mg total) by mouth 2 (two) times daily.   zolpidem 10 MG tablet Commonly known as:  AMBIEN Take 1 tablet (10 mg total) by mouth at bedtime as needed for sleep.          Objective:   Physical Exam BP 108/74 (BP Location: Right Arm, Patient Position: Sitting, Cuff Size: Small)   Pulse 91   Temp 98.2 F (36.8 C) (Oral)   Resp 16   Ht 5\' 9"  (1.753 m)   Wt 202 lb 4 oz (91.7 kg)   SpO2 93%   BMI 29.87 kg/m  General:   Well developed, NAD, see BMI.  HEENT:  Normocephalic . Face symmetric, atraumatic Neck: No JVD Lungs:  Slightly decreased breath sounds at bases? Normal respiratory effort, no intercostal retractions, no accessory muscle use. Heart: Seems regular . no pretibial edema bilaterally  Skin: Not pale. Not jaundice Neurologic:  alert & oriented X3.  Speech normal, gait appropriate for age and unassisted Psych--  Cognition and judgment appear intact.  Cooperative with normal attention span and concentration.  Behavior appropriate. No anxious or depressed appearing.      Assessment & Plan:   Assessment  DM HTN Hyperlipidemia Anxiety- insomnia  BPH Cardiovascular:Dr Ladona Ridgel  --CAD -- CHF --Atrial fibrillation;  --Pacemaker --h/o IC bleeding: Not anticoagulated --Peripheral vascular disease? Pulmonary: Dr [name], last OV 10-18-2014, f/u prn --COPD, severe, chronic resp failure, night O2 prn, has portable O2  --Pleural effusion s/p decortication --Pulmonary hypertension  Stasis dermatitis   PLAN: DM: Currently on  Januvia, last A1c 6.2, recheck today. HTN: Seems controlled on amlodipine, carvedilol, losartan (take half tablet), Demadex.  Check a BMP. BP today slt low but asx  High cholesterol: On Lipitor, last FLP excellent. COPD: Encourage use of oxygen as prescribed. RTC 4 months

## 2018-04-22 ENCOUNTER — Other Ambulatory Visit: Payer: Self-pay | Admitting: Internal Medicine

## 2018-05-01 ENCOUNTER — Other Ambulatory Visit: Payer: Self-pay | Admitting: Internal Medicine

## 2018-05-05 ENCOUNTER — Telehealth: Payer: Self-pay | Admitting: *Deleted

## 2018-05-05 NOTE — Telephone Encounter (Signed)
[facility] representative came into office and stated that they had been trying to get paperwork completed and returned since January 2019; I was at fax machine and stepped up to speak with her to inquire as to which patient and provider she was referring to. She presented paperwork for Antonietta Breach, dob: [id number], a [name] patient, she stated. I told her that I was not familiar with that pt's name & Annice Pih pulled up the chart review on patient; there was no documentation of our office ever receiving this paperwork. While completing the [facility] form, I came to part C, which states, "date patient last seen and evaluated by treating physician within the last 90 days", the provider listed is Dr. Nyoka Cowden, M.D. [Pulmonology]. I had to run a report on all appointments, and the last time pt had an OV with [name] was on 10/18/2014. I will call [facility] on Mon, 05/08/18 to discuss this matter in further detail/SLS 07/26

## 2018-05-09 NOTE — Telephone Encounter (Signed)
Per [facility], in section C, [name] is listed only as provider testing and set Oxygen perimeters for patient; the date of "last seen and evaluated by treating physician with in the last 90 days" is for the PCP [Paz] and the date must be w/i 90 days of the re-certification date [12/06/17]; forwarded to provider/SLS 07/30

## 2018-05-10 NOTE — Telephone Encounter (Signed)
Orders signed and faxed to [facility] at [phone]. Form sent for scanning.

## 2018-05-16 NOTE — Telephone Encounter (Signed)
Received Addendum to Physician Orders from [facility] requesting !C be completed/dated, then initialed and dated by provider; forwarded to provider/SLS 08/06

## 2018-05-17 NOTE — Telephone Encounter (Signed)
Re-faxed to [facility] at [phone]. Form sent for scanning.

## 2018-05-25 ENCOUNTER — Emergency Department (HOSPITAL_COMMUNITY): Payer: Medicare Other

## 2018-05-25 ENCOUNTER — Other Ambulatory Visit: Payer: Self-pay

## 2018-05-25 ENCOUNTER — Inpatient Hospital Stay (HOSPITAL_COMMUNITY)
Admission: EM | Admit: 2018-05-25 | Discharge: 2018-05-28 | DRG: 291 | Disposition: A | Discharge status: Home Health | Payer: Medicare Other | Attending: Internal Medicine | Admitting: Internal Medicine

## 2018-05-25 ENCOUNTER — Encounter (HOSPITAL_COMMUNITY): Payer: Self-pay

## 2018-05-25 DIAGNOSIS — Z79899 Other long term (current) drug therapy: Secondary | ICD-10-CM

## 2018-05-25 DIAGNOSIS — I110 Hypertensive heart disease with heart failure: Secondary | ICD-10-CM | POA: Diagnosis present

## 2018-05-25 DIAGNOSIS — I5043 Acute on chronic combined systolic (congestive) and diastolic (congestive) heart failure: Secondary | ICD-10-CM | POA: Diagnosis present

## 2018-05-25 DIAGNOSIS — J441 Chronic obstructive pulmonary disease with (acute) exacerbation: Secondary | ICD-10-CM | POA: Diagnosis present

## 2018-05-25 DIAGNOSIS — H409 Unspecified glaucoma: Secondary | ICD-10-CM | POA: Diagnosis present

## 2018-05-25 DIAGNOSIS — I272 Pulmonary hypertension, unspecified: Secondary | ICD-10-CM | POA: Diagnosis present

## 2018-05-25 DIAGNOSIS — Z885 Allergy status to narcotic agent status: Secondary | ICD-10-CM | POA: Diagnosis not present

## 2018-05-25 DIAGNOSIS — E1151 Type 2 diabetes mellitus with diabetic peripheral angiopathy without gangrene: Secondary | ICD-10-CM | POA: Diagnosis present

## 2018-05-25 DIAGNOSIS — Z7982 Long term (current) use of aspirin: Secondary | ICD-10-CM

## 2018-05-25 DIAGNOSIS — I5030 Unspecified diastolic (congestive) heart failure: Secondary | ICD-10-CM | POA: Diagnosis not present

## 2018-05-25 DIAGNOSIS — N400 Enlarged prostate without lower urinary tract symptoms: Secondary | ICD-10-CM | POA: Diagnosis present

## 2018-05-25 DIAGNOSIS — I25810 Atherosclerosis of coronary artery bypass graft(s) without angina pectoris: Secondary | ICD-10-CM | POA: Diagnosis present

## 2018-05-25 DIAGNOSIS — I5041 Acute combined systolic (congestive) and diastolic (congestive) heart failure: Secondary | ICD-10-CM | POA: Diagnosis not present

## 2018-05-25 DIAGNOSIS — Z9981 Dependence on supplemental oxygen: Secondary | ICD-10-CM

## 2018-05-25 DIAGNOSIS — E119 Type 2 diabetes mellitus without complications: Secondary | ICD-10-CM

## 2018-05-25 DIAGNOSIS — Z950 Presence of cardiac pacemaker: Secondary | ICD-10-CM

## 2018-05-25 DIAGNOSIS — E785 Hyperlipidemia, unspecified: Secondary | ICD-10-CM | POA: Diagnosis present

## 2018-05-25 DIAGNOSIS — J9621 Acute and chronic respiratory failure with hypoxia: Secondary | ICD-10-CM | POA: Diagnosis present

## 2018-05-25 DIAGNOSIS — Z87891 Personal history of nicotine dependence: Secondary | ICD-10-CM

## 2018-05-25 DIAGNOSIS — K219 Gastro-esophageal reflux disease without esophagitis: Secondary | ICD-10-CM | POA: Diagnosis present

## 2018-05-25 DIAGNOSIS — R0902 Hypoxemia: Secondary | ICD-10-CM | POA: Diagnosis present

## 2018-05-25 DIAGNOSIS — I10 Essential (primary) hypertension: Secondary | ICD-10-CM | POA: Diagnosis not present

## 2018-05-25 DIAGNOSIS — I2729 Other secondary pulmonary hypertension: Secondary | ICD-10-CM | POA: Diagnosis present

## 2018-05-25 DIAGNOSIS — R0602 Shortness of breath: Secondary | ICD-10-CM

## 2018-05-25 DIAGNOSIS — Z881 Allergy status to other antibiotic agents status: Secondary | ICD-10-CM

## 2018-05-25 DIAGNOSIS — I739 Peripheral vascular disease, unspecified: Secondary | ICD-10-CM | POA: Diagnosis not present

## 2018-05-25 DIAGNOSIS — R0600 Dyspnea, unspecified: Secondary | ICD-10-CM | POA: Diagnosis not present

## 2018-05-25 DIAGNOSIS — J9601 Acute respiratory failure with hypoxia: Secondary | ICD-10-CM | POA: Diagnosis not present

## 2018-05-25 DIAGNOSIS — E1169 Type 2 diabetes mellitus with other specified complication: Secondary | ICD-10-CM | POA: Diagnosis present

## 2018-05-25 DIAGNOSIS — E1159 Type 2 diabetes mellitus with other circulatory complications: Secondary | ICD-10-CM | POA: Diagnosis not present

## 2018-05-25 LAB — CBC WITH DIFFERENTIAL/PLATELET
Basophils Absolute: 0 10*3/uL (ref 0.0–0.1)
Basophils Relative: 0 %
Eosinophils Absolute: 0.1 10*3/uL (ref 0.0–0.7)
Eosinophils Relative: 1 %
HCT: 40.2 % (ref 39.0–52.0)
Hemoglobin: 13.7 g/dL (ref 13.0–17.0)
Lymphocytes Relative: 14 %
Lymphs Abs: 1.1 10*3/uL (ref 0.7–4.0)
MCH: 31 pg (ref 26.0–34.0)
MCHC: 34.1 g/dL (ref 30.0–36.0)
MCV: 91 fL (ref 78.0–100.0)
Monocytes Absolute: 0.4 10*3/uL (ref 0.1–1.0)
Monocytes Relative: 5 %
Neutro Abs: 6.2 10*3/uL (ref 1.7–7.7)
Neutrophils Relative %: 80 %
Platelets: 195 10*3/uL (ref 150–400)
RBC: 4.42 MIL/uL (ref 4.22–5.81)
RDW: 13.8 % (ref 11.5–15.5)
WBC: 7.8 10*3/uL (ref 4.0–10.5)

## 2018-05-25 LAB — RESPIRATORY PANEL BY PCR

## 2018-05-25 LAB — COMPREHENSIVE METABOLIC PANEL
ALT: 11 U/L (ref 0–44)
AST: 16 U/L (ref 15–41)
Albumin: 3.5 g/dL (ref 3.5–5.0)
Alkaline Phosphatase: 98 U/L (ref 38–126)
Anion gap: 10 (ref 5–15)
BUN: 12 mg/dL (ref 8–23)
CO2: 29 mmol/L (ref 22–32)
Calcium: 8.3 mg/dL — ABNORMAL LOW (ref 8.9–10.3)
Chloride: 103 mmol/L (ref 98–111)
Creatinine, Ser: 0.89 mg/dL (ref 0.61–1.24)
GFR calc Af Amer: >60 mL/min (ref >60)
GFR calc non Af Amer: >60 mL/min (ref >60)
Glucose, Bld: 155 mg/dL — ABNORMAL HIGH (ref 70–99)
Potassium: 3.5 mmol/L (ref 3.5–5.1)
Sodium: 142 mmol/L (ref 135–145)
Total Bilirubin: 1.4 mg/dL — ABNORMAL HIGH (ref 0.3–1.2)
Total Protein: 6.7 g/dL (ref 6.5–8.1)

## 2018-05-25 LAB — TSH: TSH: 0.3 u[IU]/mL — ABNORMAL LOW (ref 0.350–4.500)

## 2018-05-25 LAB — GLUCOSE, CAPILLARY: Glucose-Capillary: 233 mg/dL — ABNORMAL HIGH (ref 70–99)

## 2018-05-25 LAB — BRAIN NATRIURETIC PEPTIDE: B Natriuretic Peptide: 307.9 pg/mL — ABNORMAL HIGH (ref 0.0–100.0)

## 2018-05-25 LAB — TROPONIN I: Troponin I: <0.03 ng/mL (ref <0.03)

## 2018-05-25 MED ORDER — TAMSULOSIN HCL 0.4 MG PO CAPS
0.4000 mg | ORAL_CAPSULE | Freq: Every day | ORAL | Status: DC
  Administered 2018-05-26 – 2018-05-28 (×3): 0.4 mg via ORAL
  Filled 2018-05-25 (×3): qty 1

## 2018-05-25 MED ORDER — LOSARTAN POTASSIUM 50 MG PO TABS
50.0000 mg | ORAL_TABLET | Freq: Every day | ORAL | Status: DC
  Administered 2018-05-26 – 2018-05-27 (×2): 50 mg via ORAL
  Filled 2018-05-25 (×2): qty 1

## 2018-05-25 MED ORDER — CARVEDILOL 25 MG PO TABS
25.0000 mg | ORAL_TABLET | Freq: Two times a day (BID) | ORAL | Status: DC
  Administered 2018-05-25 – 2018-05-28 (×6): 25 mg via ORAL
  Filled 2018-05-25 (×6): qty 1

## 2018-05-25 MED ORDER — SODIUM CHLORIDE 0.9 % IV SOLN: 250.0000 mL | INTRAVENOUS | Status: DC | PRN

## 2018-05-25 MED ORDER — DOXYCYCLINE HYCLATE 100 MG PO TABS
100.0000 mg | ORAL_TABLET | Freq: Two times a day (BID) | ORAL | Status: DC
  Administered 2018-05-25 – 2018-05-26 (×2): 100 mg via ORAL
  Filled 2018-05-25 (×2): qty 1

## 2018-05-25 MED ORDER — ENOXAPARIN SODIUM 40 MG/0.4ML ~~LOC~~ SOLN
40.0000 mg | SUBCUTANEOUS | Status: DC
  Administered 2018-05-25 – 2018-05-27 (×3): 40 mg via SUBCUTANEOUS
  Filled 2018-05-25 (×3): qty 0.4

## 2018-05-25 MED ORDER — ASPIRIN EC 81 MG PO TBEC
81.0000 mg | DELAYED_RELEASE_TABLET | Freq: Every morning | ORAL | Status: DC
  Filled 2018-05-25: qty 1

## 2018-05-25 MED ORDER — FLUTICASONE FUROATE-VILANTEROL 100-25 MCG/INH IN AEPB
1.0000 | INHALATION_SPRAY | Freq: Every day | RESPIRATORY_TRACT | Status: DC
  Filled 2018-05-25: qty 28

## 2018-05-25 MED ORDER — LATANOPROST 0.005 % OP SOLN
1.0000 [drp] | Freq: Every day | OPHTHALMIC | Status: DC
  Administered 2018-05-25 – 2018-05-27 (×3): 1 [drp] via OPHTHALMIC
  Filled 2018-05-25: qty 2.5

## 2018-05-25 MED ORDER — TORSEMIDE 20 MG PO TABS
60.0000 mg | ORAL_TABLET | Freq: Two times a day (BID) | ORAL | Status: DC
  Administered 2018-05-25 – 2018-05-28 (×6): 60 mg via ORAL
  Filled 2018-05-25 (×6): qty 3

## 2018-05-25 MED ORDER — PANTOPRAZOLE SODIUM 40 MG PO TBEC
40.0000 mg | DELAYED_RELEASE_TABLET | Freq: Every morning | ORAL | Status: DC
  Administered 2018-05-26 – 2018-05-28 (×3): 40 mg via ORAL
  Filled 2018-05-25 (×3): qty 1

## 2018-05-25 MED ORDER — AMLODIPINE BESYLATE 10 MG PO TABS
10.0000 mg | ORAL_TABLET | Freq: Every day | ORAL | Status: DC
  Administered 2018-05-26 – 2018-05-28 (×3): 10 mg via ORAL
  Filled 2018-05-25 (×3): qty 1

## 2018-05-25 MED ORDER — SODIUM CHLORIDE 0.9% FLUSH: 3.0000 mL | INTRAVENOUS | Status: DC | PRN

## 2018-05-25 MED ORDER — DOXYCYCLINE HYCLATE 100 MG PO TABS
100.0000 mg | ORAL_TABLET | Freq: Once | ORAL | Status: AC
  Administered 2018-05-25: 100 mg via ORAL
  Filled 2018-05-25: qty 1

## 2018-05-25 MED ORDER — IPRATROPIUM-ALBUTEROL 0.5-2.5 (3) MG/3ML IN SOLN
3.0000 mL | Freq: Four times a day (QID) | RESPIRATORY_TRACT | Status: DC
  Administered 2018-05-25 – 2018-05-27 (×9): 3 mL via RESPIRATORY_TRACT
  Filled 2018-05-25 (×9): qty 3

## 2018-05-25 MED ORDER — ADULT MULTIVITAMIN W/MINERALS CH
1.0000 | ORAL_TABLET | Freq: Every day | ORAL | Status: DC
  Filled 2018-05-25: qty 1

## 2018-05-25 MED ORDER — ATORVASTATIN CALCIUM 20 MG PO TABS
20.0000 mg | ORAL_TABLET | Freq: Every day | ORAL | Status: DC
  Administered 2018-05-25 – 2018-05-27 (×3): 20 mg via ORAL
  Filled 2018-05-25 (×3): qty 1

## 2018-05-25 MED ORDER — KETOROLAC TROMETHAMINE 0.5 % OP SOLN
1.0000 [drp] | Freq: Four times a day (QID) | OPHTHALMIC | Status: DC
  Administered 2018-05-25 – 2018-05-28 (×12): 1 [drp] via OPHTHALMIC
  Filled 2018-05-25: qty 5

## 2018-05-25 MED ORDER — ZOLPIDEM TARTRATE 5 MG PO TABS: 5.0000 mg | ORAL_TABLET | Freq: Every evening | ORAL | Status: DC | PRN

## 2018-05-25 MED ORDER — BRIMONIDINE TARTRATE 0.2 % OP SOLN
1.0000 [drp] | Freq: Three times a day (TID) | OPHTHALMIC | Status: DC
  Administered 2018-05-25 – 2018-05-28 (×8): 1 [drp] via OPHTHALMIC
  Filled 2018-05-25: qty 5

## 2018-05-25 MED ORDER — SODIUM CHLORIDE 0.9% FLUSH
3.0000 mL | Freq: Two times a day (BID) | INTRAVENOUS | Status: DC
  Administered 2018-05-25 – 2018-05-28 (×5): 3 mL via INTRAVENOUS

## 2018-05-25 MED ORDER — NITROGLYCERIN 0.4 MG SL SUBL
0.4000 mg | SUBLINGUAL_TABLET | SUBLINGUAL | Status: DC | PRN
Start: 2018-05-25 — End: 2018-05-28

## 2018-05-25 MED ORDER — ALBUTEROL (5 MG/ML) CONTINUOUS INHALATION SOLN
15.0000 mg/h | INHALATION_SOLUTION | RESPIRATORY_TRACT | Status: DC
  Administered 2018-05-25: 15 mg/h via RESPIRATORY_TRACT
  Filled 2018-05-25: qty 20

## 2018-05-25 MED ORDER — FLUTICASONE FUROATE-VILANTEROL 100-25 MCG/INH IN AEPB
1.0000 | INHALATION_SPRAY | Freq: Every day | RESPIRATORY_TRACT | Status: DC
  Administered 2018-05-26 – 2018-05-28 (×3): 1 via RESPIRATORY_TRACT

## 2018-05-25 MED ORDER — INSULIN ASPART 100 UNIT/ML ~~LOC~~ SOLN
0.0000 [IU] | Freq: Three times a day (TID) | SUBCUTANEOUS | Status: DC
  Administered 2018-05-26: 3 [IU] via SUBCUTANEOUS
  Administered 2018-05-26 (×2): 2 [IU] via SUBCUTANEOUS
  Administered 2018-05-27 (×2): 3 [IU] via SUBCUTANEOUS
  Administered 2018-05-28: 7 [IU] via SUBCUTANEOUS
  Administered 2018-05-28: 2 [IU] via SUBCUTANEOUS

## 2018-05-25 MED ORDER — GUAIFENESIN ER 600 MG PO TB12
1200.0000 mg | ORAL_TABLET | Freq: Two times a day (BID) | ORAL | Status: DC
  Administered 2018-05-25 – 2018-05-28 (×6): 1200 mg via ORAL
  Filled 2018-05-25 (×6): qty 2

## 2018-05-25 MED ORDER — DIFLUPREDNATE 0.05 % OP EMUL: 1.0000 [drp] | Freq: Three times a day (TID) | OPHTHALMIC | Status: DC

## 2018-05-25 MED ORDER — FUROSEMIDE 10 MG/ML IJ SOLN
40.0000 mg | Freq: Once | INTRAMUSCULAR | Status: AC
  Administered 2018-05-25: 40 mg via INTRAVENOUS
  Filled 2018-05-25: qty 4

## 2018-05-25 MED ORDER — INSULIN ASPART 100 UNIT/ML ~~LOC~~ SOLN
4.0000 [IU] | Freq: Once | SUBCUTANEOUS | Status: AC
  Administered 2018-05-25: 4 [IU] via SUBCUTANEOUS

## 2018-05-25 MED ORDER — METHYLPREDNISOLONE SODIUM SUCC 40 MG IJ SOLR
40.0000 mg | Freq: Three times a day (TID) | INTRAMUSCULAR | Status: DC
  Administered 2018-05-25 – 2018-05-26 (×3): 40 mg via INTRAVENOUS
  Filled 2018-05-25 (×3): qty 1

## 2018-05-25 MED ORDER — CALCIUM CARBONATE-VITAMIN D 500-200 MG-UNIT PO TABS
1.0000 | ORAL_TABLET | Freq: Every morning | ORAL | Status: DC
  Administered 2018-05-26 – 2018-05-28 (×3): 1 via ORAL
  Filled 2018-05-25 (×3): qty 1
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
## 2018-05-25 NOTE — ED Triage Notes (Signed)
Arrived via [facility] for c/o SOB. Hx COPD. Received 2gm Magnesium, 125mg  Solu Medrol and Albuterol/Atrovent Nebuliser prior to arriving.

## 2018-05-25 NOTE — Progress Notes (Signed)
Patient is admitted to 1405 from ED. Admiision VS is stable. Patient is AOX4 with no family at the bedside

## 2018-05-25 NOTE — ED Notes (Signed)
Bed: VZ56 Expected date: 05/25/18 Expected time: 8:06 AM Means of arrival: Ambulance Comments: Resp difficulty

## 2018-05-25 NOTE — ED Notes (Signed)
ED TO INPATIENT HANDOFF REPORT  Name/Age/Gender Bradley Jock Sr. 77 y.o. male  Code Status Code Status History    Date Active Date Inactive Code Status Order ID Comments User Context   02/21/2017 1608 02/24/2017 1701 DNR [id number]  Elmarie Shiley, MD Inpatient   12/19/2015 0334 12/20/2015 1929 Full Code [id number]  Ivor Costa, MD Inpatient   08/22/2014 1700 08/25/2014 1741 Full Code [id number]  Donne Hazel, MD Inpatient   09/27/2012 1454 09/29/2012 1704 Full Code [id number]  Cliffton Asters, RN Inpatient   07/23/2012 2231 07/26/2012 2005 Full Code [id number]  Rise Patience, MD Inpatient   07/06/2012 0741 07/10/2012 1715 Full Code [id number]  Irish Elders, RN Inpatient   09/04/2011 1800 09/08/2011 1840 Full Code [id number]  Mendel Corning, MD Inpatient    Questions for Most Recent Historical Code Status (Order [id number])    Question Answer Comment   In the event of cardiac or respiratory ARREST Do not call a "code blue"    In the event of cardiac or respiratory ARREST Do not perform Intubation, CPR, defibrillation or ACLS    In the event of cardiac or respiratory ARREST Use medication by any route, position, wound care, and other measures to relive pain and suffering. May use oxygen, suction and manual treatment of airway obstruction as needed for comfort.         Advance Directive Documentation     Most Recent Value  Type of Advance Directive  Healthcare Power of Attorney  Pre-existing out of facility DNR order (yellow form or pink MOST form)  -  "MOST" Form in Place?  -      Home/SNF/Other Home  Chief Complaint Shortness of Breath  Level of Care/Admitting Diagnosis ED Disposition    ED Disposition Condition [place]: [place] [[id number]]  Level of Care: Telemetry [5]  Admit to tele based on following criteria: Acute CHF  Admit to tele based on following criteria: Monitor QTC interval  Admit to tele based on  following criteria: Monitor for Ischemic changes  Diagnosis: Acute respiratory failure with hypoxia [facility]) [[id number]]  Admitting Physician: [place], [place] [[id number]]  Attending Physician: Raiford Noble LATIF [[id number]]  Estimated length of stay: past midnight tomorrow  Certification:: I certify this patient will need inpatient services for at least 2 midnights  PT Class (Do Not Modify): Inpatient [101]  PT Acc Code (Do Not Modify): Private [1]       Medical History Past Medical History:  Diagnosis Date  . Abnormal CT scan, chest 2012   CT chest, several lymphadenopathies. Sees pulmonary  . Anemia    intermittent  . Arthritis    "?back" (08/22/2014)  . Atrial fibrillation ([place])    s/p AV node ablation & BiV PPM implantation 09/08/11 (op dictation pending)  . BPH (benign prostatic hyperplasia)    Saw Dr Reece Agar 2004, normal renal u/s  . Bronchitis 08/26/2017  . CAD (coronary artery disease)   . CHF (congestive heart failure) (HCC)    Thought primarily to be non-systolic although EF down (EF 45-50% 12/2010, down to 35-40% 09/05/11), cath 2008 with no CAD, nuclear study 07/2011 showing Small area of reversibility in the distal ant/lat wall the left ventricle suspicious for ischemia/septal wall HK but felt to be low risk  (per D/C Summary 07/2011)  . Chronic bronchitis ([place])    "get it ~ q yr"  . Heart murmur   . HLD (hyperlipidemia)   .  HTN (hypertension)   . ICB (intracranial bleed) ([place]) 06-2012   d/c coumadin permanently  . Insomnia   . Migraines    "very very rare"  . On home oxygen therapy    at night as needed  . Pacemaker   . Peripheral vascular disease ([place])    ??  . Pleural effusion 2008   S/p decortication  . Pulmonary HTN ([place])    per cath 2008  . Type II diabetes mellitus (HCC) 1999    Allergies Allergies  Allergen Reactions  . Hydrocodone Other (See Comments)    "given to him in the hospital; went thru withdrawals once home; dr said not to take it  again" (09/27/2012)  . Tramadol Other (See Comments)    "given to him in the hospital; went thru withdrawals once home; dr said not to take it again" (09/27/2012)  . Tadalafil Other (See Comments)    headache, backache  . Avelox [Moxifloxacin Hydrochloride] Itching and Other (See Comments)    Headache    IV Location/Drains/Wounds Patient Lines/Drains/Airways Status   Active Line/Drains/Airways    Name:   Placement date:   Placement time:   Site:   Days:   Peripheral IV 05/25/18 Left Forearm   05/25/18    0825    Forearm   less than 1          Labs/Imaging Results for orders placed or performed during the hospital encounter of 05/25/18 (from the past 48 hour(s))  CBC with Differential     Status: None   Collection Time: 05/25/18  8:42 AM  Result Value Ref Range   WBC 7.8 4.0 - 10.5 K/uL   RBC 4.42 4.22 - 5.81 MIL/uL   Hemoglobin 13.7 13.0 - 17.0 g/dL   HCT 40.2 39.0 - 52.0 %   MCV 91.0 78.0 - 100.0 fL   MCH 31.0 26.0 - 34.0 pg   MCHC 34.1 30.0 - 36.0 g/dL   RDW 13.8 11.5 - 15.5 %   Platelets 195 150 - 400 K/uL   Neutrophils Relative % 80 %   Neutro Abs 6.2 1.7 - 7.7 K/uL   Lymphocytes Relative 14 %   Lymphs Abs 1.1 0.7 - 4.0 K/uL   Monocytes Relative 5 %   Monocytes Absolute 0.4 0.1 - 1.0 K/uL   Eosinophils Relative 1 %   Eosinophils Absolute 0.1 0.0 - 0.7 K/uL   Basophils Relative 0 %   Basophils Absolute 0.0 0.0 - 0.1 K/uL    Comment: Performed at [facility], [place] [address] [place], [place] [address]  Comprehensive metabolic panel     Status: Abnormal   Collection Time: 05/25/18  8:42 AM  Result Value Ref Range   Sodium 142 135 - 145 mmol/L   Potassium 3.5 3.5 - 5.1 mmol/L   Chloride 103 98 - 111 mmol/L   CO2 29 22 - 32 mmol/L   Glucose, Bld 155 (H) 70 - 99 mg/dL   BUN 12 8 - 23 mg/dL   Creatinine, Ser 0.89 0.61 - 1.24 mg/dL   Calcium 8.3 (L) 8.9 - 10.3 mg/dL   Total Protein 6.7 6.5 - 8.1 g/dL   Albumin 3.5 3.5 - 5.0 g/dL   AST 16 15 -  41 U/L   ALT 11 0 - 44 U/L   Alkaline Phosphatase 98 38 - 126 U/L   Total Bilirubin 1.4 (H) 0.3 - 1.2 mg/dL   GFR calc non Af Amer >60 >60 mL/min   GFR calc Af  Amer >60 >60 mL/min    Comment: (NOTE) The eGFR has been calculated using the CKD EPI equation. This calculation has not been validated in all clinical situations. eGFR's persistently <60 mL/min signify possible Chronic Kidney Disease.    Anion gap 10 5 - 15    Comment: Performed at [facility], [place] [address] [place], [place] [address]  Troponin I     Status: None   Collection Time: 05/25/18  8:42 AM  Result Value Ref Range   Troponin I <0.03 <0.03 ng/mL    Comment: Performed at [facility], [place] [address], [place], [place] [address]  Brain natriuretic peptide     Status: Abnormal   Collection Time: 05/25/18  8:42 AM  Result Value Ref Range   B Natriuretic Peptide 307.9 (H) 0.0 - 100.0 pg/mL    Comment: Performed at [facility], [place] [address] [place], [place] [address]   Dg Chest Portable 1 View  Result Date: 05/25/2018 CLINICAL DATA:  Sob; hx COPD, CHF, htn, heart murmur; former smoker EXAM: PORTABLE CHEST 1 VIEW COMPARISON:  02/21/2017 FINDINGS: Pacer noted with lead positioning unchanged from prior. Atherosclerotic calcification of the aortic arch. Mild tortuosity of the thoracic aorta. Right heart enlargement. Mild interstitial accentuation. Bandlike opacities at the left lung base. Mildly blunted costophrenic angles. Thoracic spondylosis.  Spurring of both humeral heads. IMPRESSION: 1. Mild cardiomegaly with interstitial accentuation and some bandlike opacities at the left lung base. The appearance favors mild congestive heart failure. The left basilar opacity could represent confluent edema, early pneumonia, or atelectasis. 2.  Aortic Atherosclerosis (ICD10-I70.0). 3. Pacer noted. Electronically Signed   By: Van Clines M.D.   On: 05/25/2018 09:02     Pending Labs Unresulted Labs (From admission, onward)    Start     Ordered   05/26/18 0500  Magnesium  Tomorrow morning,   R     05/25/18 1316   05/26/18 0500  Phosphorus  Tomorrow morning,   R     05/25/18 1316   Signed and Held  HIV antibody  Once,   R     Signed and Held   Signed and Held  Basic metabolic panel  Daily,   R     Signed and Held   Signed and Held  Creatinine, serum  (enoxaparin (LOVENOX)    CrCl >/= 30 ml/min)  Weekly,   R    Comments:  while on enoxaparin therapy    Signed and Held   Signed and Held  TSH  Add-on,   R     Signed and Held   Signed and Held  Culture, blood (routine x 2) Call MD if unable to obtain prior to antibiotics being given  BLOOD CULTURE X 2,   R    Comments:  If blood cultures drawn in Emergency Department - Do not draw and cancel order    Signed and Held   Signed and Held  Culture, sputum-assessment  Once,   R     Signed and Held   Signed and Held  Respiratory Panel by PCR  (Respiratory virus panel)  Once,   R     Signed and Held   Signed and Held  Comprehensive metabolic panel  Tomorrow morning,   R     Signed and Held   Signed and Held  CBC WITH DIFFERENTIAL  Tomorrow morning,   R     Signed and Held  Vitals/Pain Today's Vitals   05/25/18 1300 05/25/18 1400 05/25/18 1429 05/25/18 1500  BP: 125/79 133/71 133/71 119/69  Pulse: 68 71 70 70  Resp:  18 (!) 23 19  Temp:      TempSrc:      SpO2: 91% 91% 90% 91%  Weight:      Height:      PainSc:        Isolation Precautions No active isolations  Medications Medications  albuterol (PROVENTIL,VENTOLIN) solution continuous neb (15 mg/hr Nebulization New Bag/Given 05/25/18 0844)  doxycycline (VIBRA-TABS) tablet 100 mg (100 mg Oral Given 05/25/18 1041)  furosemide (LASIX) injection 40 mg (40 mg Intravenous Given 05/25/18 1317)    Mobility walks

## 2018-05-25 NOTE — ED Provider Notes (Signed)
Emergency Department Provider Note   I have reviewed the triage vital signs and the nursing notes.   HISTORY  Chief Complaint Shortness of Breath   HPI Bradley [name]. is a 77 y.o. male with multiple medical problems as documented below the presents to the emergency department today secondary to dyspnea.  So that the patient had a couple days of progressively worsening symptoms.  No fevers.  Some cough.  No lower extremity or abdominal swelling.  No chest pain.  Patient called EMS on EMS arrival patient saturations were 8081% on room air and he was tachypneic with otherwise "normal vital signs".  Was given magnesium, Solu-Medrol and DuoNeb's and brought here for further evaluation.  Patient at this time states he feels a little bit better.  EMS states that his breathing has improved some. No other associated or modifying symptoms.    Past Medical History:  Diagnosis Date  . Abnormal CT scan, chest 2012   CT chest, several lymphadenopathies. Sees pulmonary  . Anemia    intermittent  . Arthritis    "?back" (08/22/2014)  . Atrial fibrillation (HCC)    s/p AV node ablation & BiV PPM implantation 09/08/11 (op dictation pending)  . BPH (benign prostatic hyperplasia)    Saw Dr [name] 2004, normal renal u/s  . Bronchitis 08/26/2017  . CAD (coronary artery disease)   . CHF (congestive heart failure) (HCC)    Thought primarily to be non-systolic although EF down (EF 32-12% 12/2010, down to 35-40% 09/05/11), cath 2008 with no CAD, nuclear study 07/2011 showing Small area of reversibility in the distal ant/lat wall the left ventricle suspicious for ischemia/septal wall HK but felt to be low risk  (per D/C Summary 07/2011)  . Chronic bronchitis (HCC)    "get it ~ q yr"  . Heart murmur   . HLD (hyperlipidemia)   . HTN (hypertension)   . 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 Insomnia   . Migraines    "very very rare"  . On home oxygen therapy    at  night as needed  . Pacemaker   . Peripheral vascular disease (HCC)    ??  . Pleural effusion 2008   S/p decortication  . Pulmonary HTN (HCC)    per cath 2008  . Type II diabetes mellitus (HCC) 1999    Patient Active Problem List   Diagnosis Date Noted  . Acute respiratory failure with hypoxia (HCC) 05/25/2018  . 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 Heart failure (HCC) 02/21/2017  . Left knee injury 02/10/2016  . Right wrist sprain 02/10/2016  . Acute on chronic respiratory failure with hypoxia (HCC) 12/19/2015  . PCP NOTES >>> 07/15/2015  . COPD exacerbation (HCC) 03/12/2015  . Acute bronchitis vs Upper Airway cough syndrome 09/07/2014  . CAD (coronary artery disease) of artery bypass graft 09/14/2013  . Acute on chronic systolic and diastolic heart failure, NYHA class 3 (HCC) 09/27/2012  . Pain in joints 07/11/2012  . Annual physical exam 05/08/2012  . 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 Lymphadenopathy 09/23/2011  . Sinoatrial node dysfunction (HCC)   . 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 Systolic and diastolic CHF, acute (HCC) 09/04/2011  . Chronic diastolic heart failure (HCC)   . PAD (peripheral artery disease) (HCC) 10/22/2010  . DERMATITIS, STASIS 09/14/2010  . Chronic respiratory failure (HCC) 09/14/2010  . Chronic pulmonary hypertension secondary to elevated L H pressures  04/15/2009  . ERECTILE  DYSFUNCTION 01/06/2009  . Hyperlipidemia 12/13/2007  . Essential hypertension 12/13/2007  . Anxiety--insomnia 08/14/2007  . DM II (diabetes mellitus, type II), controlled (HCC) 01/02/2007  . Permanent atrial fibrillation 01/02/2007  . BENIGN PROSTATIC HYPERTROPHY 01/02/2007    Past Surgical History:  Procedure Laterality Date  . BI-VENTRICULAR PACEMAKER INSERTION Left 09/08/2011   Procedure: BI-VENTRICULAR PACEMAKER INSERTION (CRT-P);  Surgeon: Marinus Maw, MD;  Location: [facility] CATH LAB;  Service: Cardiovascular;  Laterality:  Left;  . CARDIAC CATHETERIZATION     "couple times; never had balloon or stent" (09/27/2012)  . COLONOSCOPY  03/10/11   normal  . INSERT / REPLACE / REMOVE PACEMAKER  09/08/11   pacemaker placement  . LUNG DECORTICATION    . PLEURAL SCARIFICATION    . pneumothorax with fibrothorax  ~ 2010  . TONSILLECTOMY     "as a kid" (09/27/2012)    Current Outpatient Rx  . Order #: [id number] Class: Normal  . Order #: [id number] Class: Historical Med  . Order #: [id number] Class: Normal  . Order #: [id number] Class: Historical Med  . Order #: [id number] Class: Historical Med  . Order #: [id number] Class: Historical Med  . Order #: [id number] Class: Historical Med  . Order #: [id number] Class: Normal  . Order #: [id number] Class: Historical Med  . Order #: [id number] Class: Normal  . Order #: [id number] Class: Normal  . Order #: [id number] Class: Print  . Order #: [id number] Class: Normal  . Order #: [id number] Class: Normal  . Order #: [id number] Class: Normal  . Order #: [id number] Class: Historical Med  . Order #: [id number] Class: Normal  . Order #: [id number] Class: Normal  . Order #: [id number] Class: Normal  . Order #: [id number] Class: Normal    Allergies Hydrocodone; Tramadol; Tadalafil; and Avelox [moxifloxacin hydrochloride]  Family History  Problem Relation Age of Onset  . Diabetes Father   . Coronary artery disease Father   . Polycythemia Mother   . Drug abuse Son   . Breast cancer Maternal Aunt   . Prostate cancer Neg Hx   . Colon cancer Neg Hx     Social History Social History   Tobacco Use  . Smoking status: Former Smoker    Packs/day: 2.50    Years: 25.00    Pack years: 62.50    Types: Cigarettes    Last attempt to quit: 10/11/1980    Years since quitting: 37.6  . Smokeless tobacco: Never Used  . Tobacco comment: used to smoke 2.5 ppd for 25 years  Substance Use Topics  . Alcohol use: No    Alcohol/week: 0.0 standard drinks    Comment: 08/22/2014 "maybe q couple months I'll have a beer  w/dinner"  . Drug use: No    Review of Systems  All other systems negative except as documented in the HPI. All pertinent positives and negatives as reviewed in the HPI. ____________________________________________   PHYSICAL EXAM:  VITAL SIGNS: ED Triage Vitals  Enc Vitals Group     BP      Pulse      Resp      Temp      Temp src      SpO2      Weight      Height      Head Circumference      Peak Flow      Pain Score      Pain Loc      Pain Edu?      Excl. in GC?     Constitutional: Alert and oriented. Well appearing and in no acute  distress. Eyes: Conjunctivae are normal. PERRL. EOMI. Head: Atraumatic. Nose: No congestion/rhinnorhea. Mouth/Throat: Mucous membranes are moist.  Oropharynx non-erythematous. Neck: No stridor.  No meningeal signs.   Cardiovascular: Normal rate, regular rhythm. Good peripheral circulation. Grossly normal heart sounds.   Respiratory: tachypneic.  Significantly diminished breath sounds bilaterally with wheezing.  Some accessory muscle use.  Mild respiratory distress.   Gastrointestinal: Soft and nontender. No distention.  Musculoskeletal: No lower extremity tenderness nor edema. No gross deformities of extremities. Neurologic:  Normal speech and language. No gross focal neurologic deficits are appreciated.  Skin:  Skin is warm, dry and intact. No rash noted.   ____________________________________________   LABS (all labs ordered are listed, but only abnormal results are displayed)  Labs Reviewed  COMPREHENSIVE METABOLIC PANEL - Abnormal; Notable for the following components:      Result Value   Glucose, Bld 155 (*)    Calcium 8.3 (*)    Total Bilirubin 1.4 (*)    All other components within normal limits  BRAIN NATRIURETIC PEPTIDE - Abnormal; Notable for the following components:   B Natriuretic Peptide 307.9 (*)    All other components within normal limits  CBC WITH DIFFERENTIAL/PLATELET  TROPONIN I    ____________________________________________  EKG   EKG Interpretation  Date/Time:  Thursday May 25 2018 08:48:31 EDT Ventricular Rate:  70 PR Interval:    QRS Duration: 131 QT Interval:  429 QTC Calculation: 463 R Axis:   -101 Text Interpretation:  Afib/flutter and ventricular-paced rhythm No further analysis attempted due to paced rhythm decreased PVC's No significant change since last tracing Confirmed by Marily Memos [phone]) on 05/25/2018 9:03:48 AM       ____________________________________________  RADIOLOGY  Dg Chest Portable 1 View  Result Date: 05/25/2018 CLINICAL DATA:  Sob; hx COPD, CHF, htn, heart murmur; former smoker EXAM: PORTABLE CHEST 1 VIEW COMPARISON:  02/21/2017 FINDINGS: Pacer noted with lead positioning unchanged from prior. Atherosclerotic calcification of the aortic arch. Mild tortuosity of the thoracic aorta. Right heart enlargement. Mild interstitial accentuation. Bandlike opacities at the left lung base. Mildly blunted costophrenic angles. Thoracic spondylosis.  Spurring of both humeral heads. IMPRESSION: 1. Mild cardiomegaly with interstitial accentuation and some bandlike opacities at the left lung base. The appearance favors mild congestive heart failure. The left basilar opacity could represent confluent edema, early pneumonia, or atelectasis. 2.  Aortic Atherosclerosis (ICD10-I70.0). 3. Pacer noted. Electronically Signed   By: Gaylyn Rong M.D.   On: 05/25/2018 09:02    ____________________________________________   PROCEDURES  Procedure(s) performed:   Procedures  CRITICAL CARE Performed by: Marily Memos Total critical care time: 35 minutes Critical care time was exclusive of separately billable procedures and treating other patients. Critical care was necessary to treat or prevent imminent or life-threatening deterioration. Critical care was time spent personally by me on the following activities: development of treatment plan  with patient and/or surrogate as well as nursing, discussions with consultants, evaluation of patient's response to treatment, examination of patient, obtaining history from patient or surrogate, ordering and performing treatments and interventions, ordering and review of laboratory studies, ordering and review of radiographic studies, pulse oximetry and re-evaluation of patient's condition.  ____________________________________________   INITIAL IMPRESSION / ASSESSMENT AND PLAN / ED COURSE  Presentation consistent with likely bronchoconstriction.  Was a milk in the past could be COPD.  Feels warm so we will wait to see what his temperature is but will likely need antibiotics.  We will get x-ray and start continuous albuterol.  Less suspicion for CHF at this time even though the patient has a history of it but if not improving with albuterol could be cardiac wheezing but does not have lower extremity edema.  Will get an EKG to start with.  Hypoxic with diminished breath sounds and wheezing.  Antibiotics given for possible pneumonia on x-ray.  BNP is slightly elevated so could have a component of CHF as well so after discussion with the hospitalist did give a dose of Lasix per   Pertinent labs & imaging results that were available during my care of the patient were reviewed by me and considered in my medical decision making (see chart for details).  ____________________________________________  FINAL CLINICAL IMPRESSION(S) / ED DIAGNOSES  Final diagnoses:  Hypoxia  COPD exacerbation (HCC)  Acute on chronic respiratory failure with hypoxia (HCC)     MEDICATIONS GIVEN DURING THIS VISIT:  Medications  albuterol (PROVENTIL,VENTOLIN) solution continuous neb (15 mg/hr Nebulization New Bag/Given 05/25/18 0844)  doxycycline (VIBRA-TABS) tablet 100 mg (100 mg Oral Given 05/25/18 1041)  furosemide (LASIX) injection 40 mg (40 mg Intravenous Given 05/25/18 1317)     NEW OUTPATIENT MEDICATIONS  STARTED DURING THIS VISIT:  New Prescriptions   No medications on file    Note:  This note was prepared with assistance of Dragon voice recognition software. Occasional wrong-word or sound-a-like substitutions may have occurred due to the inherent limitations of voice recognition software.   Nira Visscher, Barbara Cower, MD 05/25/18 1500

## 2018-05-26 ENCOUNTER — Inpatient Hospital Stay (HOSPITAL_COMMUNITY): Payer: Medicare Other

## 2018-05-26 DIAGNOSIS — E1159 Type 2 diabetes mellitus with other circulatory complications: Secondary | ICD-10-CM

## 2018-05-26 DIAGNOSIS — I5030 Unspecified diastolic (congestive) heart failure: Secondary | ICD-10-CM

## 2018-05-26 DIAGNOSIS — I25810 Atherosclerosis of coronary artery bypass graft(s) without angina pectoris: Secondary | ICD-10-CM

## 2018-05-26 DIAGNOSIS — J9601 Acute respiratory failure with hypoxia: Secondary | ICD-10-CM

## 2018-05-26 DIAGNOSIS — I5041 Acute combined systolic (congestive) and diastolic (congestive) heart failure: Secondary | ICD-10-CM

## 2018-05-26 DIAGNOSIS — I110 Hypertensive heart disease with heart failure: Principal | ICD-10-CM

## 2018-05-26 DIAGNOSIS — J441 Chronic obstructive pulmonary disease with (acute) exacerbation: Secondary | ICD-10-CM

## 2018-05-26 DIAGNOSIS — R0600 Dyspnea, unspecified: Secondary | ICD-10-CM

## 2018-05-26 DIAGNOSIS — R0902 Hypoxemia: Secondary | ICD-10-CM

## 2018-05-26 LAB — ECHOCARDIOGRAM COMPLETE
Height: 69 in
Weight: 3094.4 oz

## 2018-05-26 LAB — CBC WITH DIFFERENTIAL/PLATELET
Basophils Absolute: 0 10*3/uL (ref 0.0–0.1)
Basophils Relative: 0 %
Eosinophils Absolute: 0 10*3/uL (ref 0.0–0.7)
Eosinophils Relative: 0 %
HCT: 43.4 % (ref 39.0–52.0)
Hemoglobin: 14.7 g/dL (ref 13.0–17.0)
Lymphocytes Relative: 11 %
Lymphs Abs: 0.9 10*3/uL (ref 0.7–4.0)
MCH: 30.6 pg (ref 26.0–34.0)
MCHC: 33.9 g/dL (ref 30.0–36.0)
MCV: 90.2 fL (ref 78.0–100.0)
Monocytes Absolute: 0.1 10*3/uL (ref 0.1–1.0)
Monocytes Relative: 2 %
Neutro Abs: 7.2 10*3/uL (ref 1.7–7.7)
Neutrophils Relative %: 87 %
Platelets: 208 10*3/uL (ref 150–400)
RBC: 4.81 MIL/uL (ref 4.22–5.81)
RDW: 13.7 % (ref 11.5–15.5)
WBC: 8.2 10*3/uL (ref 4.0–10.5)

## 2018-05-26 LAB — COMPREHENSIVE METABOLIC PANEL
ALT: 13 U/L (ref 0–44)
AST: 16 U/L (ref 15–41)
Albumin: 3.4 g/dL — ABNORMAL LOW (ref 3.5–5.0)
Alkaline Phosphatase: 95 U/L (ref 38–126)
Anion gap: 9 (ref 5–15)
BUN: 21 mg/dL (ref 8–23)
CO2: 32 mmol/L (ref 22–32)
Calcium: 8.9 mg/dL (ref 8.9–10.3)
Chloride: 101 mmol/L (ref 98–111)
Creatinine, Ser: 0.91 mg/dL (ref 0.61–1.24)
GFR calc Af Amer: >60 mL/min (ref >60)
GFR calc non Af Amer: >60 mL/min (ref >60)
Glucose, Bld: 210 mg/dL — ABNORMAL HIGH (ref 70–99)
Potassium: 4.2 mmol/L (ref 3.5–5.1)
Sodium: 142 mmol/L (ref 135–145)
Total Bilirubin: 0.8 mg/dL (ref 0.3–1.2)
Total Protein: 6.9 g/dL (ref 6.5–8.1)

## 2018-05-26 LAB — MAGNESIUM: Magnesium: 2.3 mg/dL (ref 1.7–2.4)

## 2018-05-26 LAB — GLUCOSE, CAPILLARY
Glucose-Capillary: 190 mg/dL — ABNORMAL HIGH (ref 70–99)
Glucose-Capillary: 222 mg/dL — ABNORMAL HIGH (ref 70–99)
Glucose-Capillary: 188 mg/dL — ABNORMAL HIGH (ref 70–99)
Glucose-Capillary: 284 mg/dL — ABNORMAL HIGH (ref 70–99)

## 2018-05-26 LAB — HIV ANTIBODY (ROUTINE TESTING W REFLEX): HIV Screen 4th Generation wRfx: NONREACTIVE

## 2018-05-26 LAB — PHOSPHORUS: Phosphorus: 3.4 mg/dL (ref 2.5–4.6)

## 2018-05-26 MED ORDER — METHYLPREDNISOLONE SODIUM SUCC 40 MG IJ SOLR
40.0000 mg | INTRAMUSCULAR | Status: DC
  Administered 2018-05-27 – 2018-05-28 (×2): 40 mg via INTRAVENOUS
  Filled 2018-05-26 (×2): qty 1

## 2018-05-26 MED ORDER — ASPIRIN EC 81 MG PO TBEC
81.0000 mg | DELAYED_RELEASE_TABLET | Freq: Every morning | ORAL | Status: DC
  Administered 2018-05-26 – 2018-05-28 (×3): 81 mg via ORAL
  Filled 2018-05-26 (×3): qty 1

## 2018-05-26 MED ORDER — ADULT MULTIVITAMIN W/MINERALS CH
1.0000 | ORAL_TABLET | Freq: Every day | ORAL | Status: DC
  Administered 2018-05-26 – 2018-05-28 (×3): 1 via ORAL
  Filled 2018-05-26 (×3): qty 1

## 2018-05-26 MED ORDER — ALBUTEROL SULFATE (2.5 MG/3ML) 0.083% IN NEBU: 2.5000 mg | INHALATION_SOLUTION | RESPIRATORY_TRACT | Status: DC | PRN

## 2018-05-26 NOTE — Progress Notes (Signed)
Respiratory panel results show pt is positive for rhinovirus/enterovirus. Bodenheimer, NP notified. Droplet precautions continued. Pt alert, oriented, in distress and no c/o SOB this am.

## 2018-05-26 NOTE — Progress Notes (Signed)
Inpatient Diabetes Program Recommendations  AACE/ADA: New Consensus Statement on Inpatient Glycemic Control (2015)  Target Ranges:  Prepandial:   less than 140 mg/dL      Peak postprandial:   less than 180 mg/dL (1-2 hours)      Critically ill patients:  140 - 180 mg/dL   Results for ZENDE, REICHARDT SR. (MRN [id number]) as of 05/26/2018 12:35  Ref. Range 05/25/2018 21:07 05/26/2018 07:54 05/26/2018 12:19  Glucose-Capillary Latest Ref Range: 70 - 99 mg/dL 481 (H) 856 (H) 314 (H)   Review of Glycemic Control  Diabetes history: DM 2 Outpatient Diabetes medications: Januvia 100 mg Daily Current orders for Inpatient glycemic control: Novolog 0-9 units tid  Inpatient Diabetes Program Recommendations:    A1c 6.6% on 6/24  On IV Solumedrol 40 mg Q8 hours. Glucose trends 190-220's. Consider increasing Novolog Correction to 0-15 units tid and possibly adding Novolog 2-3 units tid meal coverage if patient consumes at least 50% of meals.  Thanks,  Christena Deem RN, MSN, BC-ADM Inpatient Diabetes Coordinator Team Pager [phone] (8a-5p)
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
## 2018-05-26 NOTE — Evaluation (Signed)
Physical Therapy Evaluation-1x Patient Details Name: Bradley SCHREIER Sr. MRN: [id number] DOB: 04-21-1941 Today's Date: 05/26/2018     SATURATION QUALIFICATIONS: (This note is used to comply with regulatory documentation for home oxygen)  Patient Saturations on Room Air at Rest = 87%  Patient Saturations on Room Air while Ambulating = N/A  Patient Saturations on 2/3 Liters of oxygen while Ambulating = 85% (2L)/92%(3L)     History of Present Illness  77 yo male admitted with COPD exac, CHF exac, (+) rhinovirus/enterovirus. Hx of COPD-2L O2 at night, PVD, A fib, CAD, CHF, DM, pulm HTN, pacemaker  Clinical Impression  On eval, pt was Mod Ind with mobility. He walked ~200 feet. No dyspnea noted but pt did c/o some chest tightness with activity and deep breathing. No f/u PT needs at this time. Will sign off.     Follow Up Recommendations No PT follow up    Equipment Recommendations  None recommended by PT    Recommendations for Other Services       Precautions / Restrictions Precautions Precautions: Other (comment) Precaution Comments: monitor O2 Restrictions Weight Bearing Restrictions: No      Mobility  Bed Mobility Overal bed mobility: Independent                Transfers Overall transfer level: Independent                  Ambulation/Gait Ambulation/Gait assistance: Modified independent (Device/Increase time) Gait Distance (Feet): 200 Feet Assistive device: None Gait Pattern/deviations: Decreased stride length;Step-through pattern     General Gait Details: mildly unsteady. no lob. 1 brief standing rest break to allow for O2 sat recovery  Stairs            Wheelchair Mobility    Modified Rankin (Stroke Patients Only)       Balance Overall balance assessment: Mild deficits observed, not formally tested                                           Pertinent Vitals/Pain Pain Assessment: Faces Faces Pain Scale: Hurts  little more Pain Location: chest (with activity and deep breathing) Pain Descriptors / Indicators: Tightness Pain Intervention(s): Monitored during session    Home Living Family/patient expects to be discharged to:: Private residence Living Arrangements: Spouse/significant other;Other relatives;Children Available Help at Discharge: Family Type of Home: Apartment       Home Layout: Multi-level Home Equipment: Cane - single point(stair lift)      Prior Function Level of Independence: Independent               Hand Dominance        Extremity/Trunk Assessment   Upper Extremity Assessment Upper Extremity Assessment: Defer to OT evaluation    Lower Extremity Assessment Lower Extremity Assessment: Overall WFL for tasks assessed    Cervical / Trunk Assessment Cervical / Trunk Assessment: Normal  Communication   Communication: No difficulties  Cognition Arousal/Alertness: Awake/[name] Behavior During Therapy: WFL for tasks assessed/performed Overall Cognitive Status: Within Functional Limits for tasks assessed                                        General Comments      Exercises     Assessment/Plan    PT Assessment Patent  does not need any further PT services  PT Problem List         PT Treatment Interventions      PT Goals (Current goals can be found in the Care Plan section)  Acute Rehab PT Goals Patient Stated Goal: home PT Goal Formulation: All assessment and education complete, DC therapy    Frequency     Barriers to discharge        Co-evaluation               AM-PAC PT "6 Clicks" Daily Activity  Outcome Measure Difficulty turning over in bed (including adjusting bedclothes, sheets and blankets)?: None Difficulty moving from lying on back to sitting on the side of the bed? : None Difficulty sitting down on and standing up from a chair with arms (e.g., wheelchair, bedside commode, etc,.)?: None Help needed moving to  and from a bed to chair (including a wheelchair)?: None Help needed walking in hospital room?: A Little Help needed climbing 3-5 steps with a railing? : A Little 6 Click Score: 22    End of Session Equipment Utilized During Treatment: Oxygen Activity Tolerance: Patient tolerated treatment well Patient left: in bed;with call bell/phone within reach        Time: 5929-2446 PT Time Calculation (min) (ACUTE ONLY): 18 min   Charges:   PT Evaluation $PT Eval Moderate Complexity: 1 Mod          Bradley [name], MPT Pager: [phone]
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
## 2018-05-26 NOTE — Progress Notes (Signed)
  Echocardiogram 2D Echocardiogram has been performed.  Bradley [name] Bradley [name] 05/26/2018, 1:08 PM

## 2018-05-26 NOTE — Evaluation (Signed)
Occupational Therapy Evaluation Patient Details Name: Bradley [name]. MRN: [id number] DOB: 12/12/1940 Today's Date: 05/26/2018    History of Present Illness 77 yo male admitted with COPD exac, CHF exac, (+) rhinovirus/enterovirus. Hx of COPD-2L O2 at night, PVD, A fib, CAD, CHF, DM, pulm HTN, pacemaker   Clinical Impression   Met with pt standing dressing self in gown after using BR this date. Pt not wearing oxygen 2/2 to dressing self in new gown, pt to be on 2L of O2 this date. After standing and finishing UB dressing, O2 sats 83, improved with sitting and pt donning O2. No strength deficits or LOB noted. ECS strategies reviewed, pt begins to recall strategies in BADL/IADL routine that he has implemented at baseline. Pt completing pursed lip breathing at baseline. Encouraged pt to sit whenever completing BADL/IADL as additional form of ECS, pt in understanding despite eagerness to be active. Pt with shower seat and stair lift in the home at baseline. No further acute OT needs identified, no follow up OT needs identified.    Follow Up Recommendations  No OT follow up    Equipment Recommendations  None recommended by OT;Other (comment)(has shower seat for ECS)    Recommendations for Other Services       Precautions / Restrictions Precautions Precautions: Other (comment) Precaution Comments: monitor O2 Restrictions Weight Bearing Restrictions: No      Mobility Bed Mobility Overal bed mobility: Independent                Transfers Overall transfer level: Independent                    Balance Overall balance assessment: Mild deficits observed, not formally tested                                         ADL either performed or assessed with clinical judgement   ADL Overall ADL's : Modified independent                                       General ADL Comments: Pt standing in room dressing self in gown after using BR upon OT  arrival     Vision Baseline Vision/History: Wears glasses Wears Glasses: At all times Patient Visual Report: No change from baseline       Perception     Praxis      Pertinent Vitals/Pain Pain Assessment: Faces Faces Pain Scale: No hurt     Hand Dominance     Extremity/Trunk Assessment Upper Extremity Assessment Upper Extremity Assessment: Overall WFL for tasks assessed   Lower Extremity Assessment Lower Extremity Assessment: Overall WFL for tasks assessed   Cervical / Trunk Assessment Cervical / Trunk Assessment: Normal   Communication Communication Communication: No difficulties   Cognition Arousal/Alertness: Awake/alert Behavior During Therapy: WFL for tasks assessed/performed Overall Cognitive Status: Within Functional Limits for tasks assessed                                     General Comments  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, 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 "few years"    Exercises     Shoulder Instructions      Home Living Family/patient  expects to be discharged to:: Private residence Living Arrangements: Spouse/significant other;Other relatives;Children(granddaughter, her children) Available Help at Discharge: Family Type of Home: Other(Comment)(townhouse)       Home Layout: Multi-level Alternate Level Stairs-Number of Steps: 1 flight             Home Equipment: Other (comment);Shower seat(stair lift)          Prior Functioning/Environment Level of Independence: Independent        Comments: pt is independent for selfcare/BADL, family members assist with IADL at baseline        OT Problem List: Cardiopulmonary status limiting activity;Decreased activity tolerance      OT Treatment/Interventions:      OT Goals(Current goals can be found in the care plan section) Acute Rehab OT Goals Patient Stated Goal: to return home OT Goal Formulation: With patient Time For Goal Achievement: 06/09/18 Potential to Achieve Goals:  Good  OT Frequency:     Barriers to D/C:            Co-evaluation              AM-PAC PT "6 Clicks" Daily Activity     Outcome Measure Help from another person eating meals?: None Help from another person taking care of personal grooming?: None Help from another person toileting, which includes using toliet, bedpan, or urinal?: None Help from another person bathing (including washing, rinsing, drying)?: None Help from another person to put on and taking off regular upper body clothing?: None Help from another person to put on and taking off regular lower body clothing?: None 6 Click Score: 24   End of Session Equipment Utilized During Treatment: Oxygen  Activity Tolerance: Patient tolerated treatment well Patient left: in bed;with call bell/phone within reach  OT Visit Diagnosis: Other abnormalities of gait and mobility (R26.89)                Time: 3388-2666 OT Time Calculation (min): 13 min Charges:  OT General Charges $OT Visit: 1 Visit OT Evaluation $OT Eval Low Complexity: 1 Low  Zenovia Jarred, MSOT, OTR/L  [place] 05/26/2018, 2:35 PM

## 2018-05-27 ENCOUNTER — Other Ambulatory Visit: Payer: Self-pay | Admitting: Internal Medicine

## 2018-05-27 DIAGNOSIS — E785 Hyperlipidemia, unspecified: Secondary | ICD-10-CM

## 2018-05-27 DIAGNOSIS — N400 Enlarged prostate without lower urinary tract symptoms: Secondary | ICD-10-CM

## 2018-05-27 LAB — BASIC METABOLIC PANEL
Anion gap: 7 (ref 5–15)
BUN: 27 mg/dL — ABNORMAL HIGH (ref 8–23)
CO2: 34 mmol/L — ABNORMAL HIGH (ref 22–32)
Calcium: 8.6 mg/dL — ABNORMAL LOW (ref 8.9–10.3)
Chloride: 99 mmol/L (ref 98–111)
Creatinine, Ser: 1.06 mg/dL (ref 0.61–1.24)
GFR calc Af Amer: >60 mL/min (ref >60)
GFR calc non Af Amer: >60 mL/min (ref >60)
Glucose, Bld: 147 mg/dL — ABNORMAL HIGH (ref 70–99)
Potassium: 3.8 mmol/L (ref 3.5–5.1)
Sodium: 140 mmol/L (ref 135–145)

## 2018-05-27 LAB — GLUCOSE, CAPILLARY
Glucose-Capillary: 111 mg/dL — ABNORMAL HIGH (ref 70–99)
Glucose-Capillary: 247 mg/dL — ABNORMAL HIGH (ref 70–99)
Glucose-Capillary: 236 mg/dL — ABNORMAL HIGH (ref 70–99)
Glucose-Capillary: 216 mg/dL — ABNORMAL HIGH (ref 70–99)

## 2018-05-27 MED ORDER — ALBUTEROL SULFATE (2.5 MG/3ML) 0.083% IN NEBU: 2.5000 mg | INHALATION_SOLUTION | RESPIRATORY_TRACT | Status: DC | PRN

## 2018-05-27 MED ORDER — POTASSIUM CHLORIDE CRYS ER 20 MEQ PO TBCR
20.0000 meq | EXTENDED_RELEASE_TABLET | Freq: Once | ORAL | Status: AC
  Administered 2018-05-27: 20 meq via ORAL
  Filled 2018-05-27: qty 1

## 2018-05-27 MED ORDER — IPRATROPIUM-ALBUTEROL 0.5-2.5 (3) MG/3ML IN SOLN
3.0000 mL | Freq: Two times a day (BID) | RESPIRATORY_TRACT | Status: DC
  Administered 2018-05-28: 3 mL via RESPIRATORY_TRACT
  Filled 2018-05-27: qty 3

## 2018-05-27 NOTE — Progress Notes (Signed)
PROGRESS NOTE    Bradley DADY Sr.  ZOX:[id number] DOB: 09/04/1941 DOA: 05/25/2018 PCP: [name], MD    Brief Narrative:  77 year old male who presented with dyspnea.  He does have a significant past medical history for hypertension, dyslipidemia, diastolic heart failure, anemia, atrial fibrillation status post AV node ablation and biventricular pacemaker implantation, coronary artery disease and hypoxic respiratory failure on home oxygen.  He also has history for pulmonary hypertension and type 2 diabetes mellitus.  He reported 48 hours of worsening dyspnea, associated with PND and progressive dyspnea on exertion.  Due to severe symptoms he called EMS, he was found to have a oxygen saturation of 80% on room air.  On the initial physical examination blood pressure 133/71, heart rate 71, respiratory rate 23, temperature 97.4 and oxygen saturation 93%.  He had diminished breath sounds bilaterally, positive wheezing and rales.  Heart S1-2 present and rhythmic, abdomen soft nontender, trace lower extremity edema.  Sodium 142, potassium 3.5, chloride 103, bicarb 29, glucose 155, BUN 12, creatinine 0.89, white count 7.8, hemoglobin 13.7, hematocrit 40.2, platelets 195.  Chest x-ray had cardiomegaly, increased interstitial markings bilaterally, pacemaker in place.  EKG 100% paced rhythm.  Patient was admitted to the hospital with working diagnosis of acute on chronic hypoxic respiratory failure due to COPD exacerbation.   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 COPD exacerbation with acute on chronic hypoxic respiratory failure due to rhinovirus. Symptoms have improve but not yet at baseline, will continue aggressive bronchodilator therapy with duoneb,  systemic steroids with methylprednisolone,  oxymetry monitoring and supplemental 02 per [place]. Case manager consulted to resume home 02. Continue fluticasone and vilanterol inh.    2. HTN. Continue amlodipine, losartan and carvedilol for blood pressure control.   3. Acute on chronic diastolic heart failure/ acute core pulmonale.  Echocardiogram with normal LV systolic function, noted elevated PA pressure up to 40 mmHg. Will continue blood pressure control and continue home dose of torsemide 60 mg po bid. Continue losartan and carvedilol.   4. T2DM. Glucose cover and monitoring with insulin sliding scale, capillary glucose 222, 188, 284, 111, 247.   5. Dyslipidemia. Continue atorvastatin.    DVT prophylaxis: enoxaparin   Code Status:  full Family Communication: no family at the bedside  Disposition Plan/ discharge barriers: pending clinical improvement/ will need home 02 at discharge.    Consultants:     Procedures:     Antimicrobials:       Subjective: Patient with persistent dyspnea, improved over last 24 hours, but still not back to baseline, no nausea or vomiting, no chest pain.   Objective: Vitals:   05/27/18 0214 05/27/18 0542 05/27/18 0825 05/27/18 0834  BP:  111/68  (!) 110/56  Pulse:  76    Resp:  16    Temp:  97.8 F (36.6 C)    TempSrc:  Oral    SpO2: 91% 97% 95%   Weight:  87.7 kg    Height:        Intake/Output Summary (Last 24 hours) at 05/27/2018 1159 Last data filed at 05/27/2018 1000 Gross per 24 hour  Intake 403 ml  Output 900 ml  Net -497 ml   Filed Weights   05/25/18 0831  05/26/18 0438 05/27/18 0542  Weight: 108.9 kg 87.7 kg 87.7 kg    Examination:   General: Not in pain or dyspnea. Deconditioned  Neurology: Awake and alert, non focal  E ENT: mild pallor, no icterus, oral mucosa moist Cardiovascular: No JVD. S1-S2 present, rhythmic, no gallops, rubs, or murmurs. No lower extremity edema. Pulmonary: decreased breath sounds bilaterally, poor air movement, positive expiratory wheezing with scattered rhonchi and rales. Gastrointestinal. Abdomen with no organomegaly, non tender, no rebound or guarding Skin. No rashes Musculoskeletal: no joint deformities     Data Reviewed: I have personally reviewed following labs and imaging studies  CBC: Recent Labs  Lab 05/25/18 0842 05/26/18 0535  WBC 7.8 8.2  NEUTROABS 6.2 7.2  HGB 13.7 14.7  HCT 40.2 43.4  MCV 91.0 90.2  PLT 195 208   Basic Metabolic Panel: Recent Labs  Lab 05/25/18 0842 05/26/18 0535 05/27/18 0518  NA 142 142 140  K 3.5 4.2 3.8  CL 103 101 99  CO2 29 32 34*  GLUCOSE 155* 210* 147*  BUN 12 21 27*  CREATININE 0.89 0.91 1.06  CALCIUM 8.3* 8.9 8.6*  MG  --  2.3  --   PHOS  --  3.4  --    GFR: Estimated Creatinine Clearance: 64 mL/min (by C-G formula based on SCr of 1.06 mg/dL). Liver Function Tests: Recent Labs  Lab 05/25/18 0842 05/26/18 0535  AST 16 16  ALT 11 13  ALKPHOS 98 95  BILITOT 1.4* 0.8  PROT 6.7 6.9  ALBUMIN 3.5 3.4*   No results for input(s): LIPASE, AMYLASE in the last 168 hours. No results for input(s): AMMONIA in the last 168 hours. Coagulation Profile: No results for input(s): INR, PROTIME in the last 168 hours. Cardiac Enzymes: Recent Labs  Lab 05/25/18 0842  TROPONINI <0.03   BNP (last 3 results) No results for input(s): PROBNP in the last 8760 hours. HbA1C: No results for input(s): HGBA1C in the last 72 hours. CBG: Recent Labs  Lab 05/26/18 0754 05/26/18 1219 05/26/18 1728 05/26/18 2100 05/27/18 0742  GLUCAP 190* 222* 188* 284* 111*    Lipid Profile: No results for input(s): CHOL, HDL, LDLCALC, TRIG, CHOLHDL, LDLDIRECT in the last 72 hours. Thyroid Function Tests: Recent Labs    05/25/18 1646  TSH 0.300*   Anemia Panel: No results for input(s): VITAMINB12, FOLATE, FERRITIN, TIBC, IRON, RETICCTPCT in the last 72 hours.    Radiology Studies: I have reviewed all of the imaging during this hospital visit personally     Scheduled Meds: . amLODipine  10 mg Oral Daily  . aspirin EC  81 mg Oral q morning - 10a  . atorvastatin  20 mg Oral QHS  . brimonidine  1 drop Left Eye TID  . calcium-vitamin D  1 tablet Oral q morning - 10a  . carvedilol  25 mg Oral BID WC  . enoxaparin (LOVENOX) injection  40 mg Subcutaneous Q24H  . fluticasone furoate-vilanterol  1 puff Inhalation Daily  . guaiFENesin  1,200 mg Oral BID  . insulin aspart  0-9 Units Subcutaneous TID WC  . ipratropium-albuterol  3 mL  Nebulization Q6H  . ketorolac  1 drop Left Eye QID  . latanoprost  1 drop Left Eye QHS  . losartan  50 mg Oral QHS  . methylPREDNISolone (SOLU-MEDROL) injection  40 mg Intravenous Q24H  . multivitamin with minerals  1 tablet Oral Daily  . pantoprazole  40 mg Oral q morning - 10a  . sodium chloride flush  3 mL Intravenous Q12H  . tamsulosin  0.4 mg Oral Daily  . torsemide  60 mg Oral BID   Continuous Infusions: . sodium chloride       LOS: 2 days        Bradley Annett Gula, MD [facility] Pager [phone]

## 2018-05-28 DIAGNOSIS — J9621 Acute and chronic respiratory failure with hypoxia: Secondary | ICD-10-CM

## 2018-05-28 DIAGNOSIS — I5043 Acute on chronic combined systolic (congestive) and diastolic (congestive) heart failure: Secondary | ICD-10-CM

## 2018-05-28 LAB — BASIC METABOLIC PANEL
Anion gap: 10 (ref 5–15)
BUN: 25 mg/dL — ABNORMAL HIGH (ref 8–23)
CO2: 35 mmol/L — ABNORMAL HIGH (ref 22–32)
Calcium: 8.6 mg/dL — ABNORMAL LOW (ref 8.9–10.3)
Chloride: 97 mmol/L — ABNORMAL LOW (ref 98–111)
Creatinine, Ser: 0.79 mg/dL (ref 0.61–1.24)
GFR calc Af Amer: >60 mL/min (ref >60)
GFR calc non Af Amer: >60 mL/min (ref >60)
Glucose, Bld: 131 mg/dL — ABNORMAL HIGH (ref 70–99)
Potassium: 3.9 mmol/L (ref 3.5–5.1)
Sodium: 142 mmol/L (ref 135–145)

## 2018-05-28 LAB — GLUCOSE, CAPILLARY
Glucose-Capillary: 161 mg/dL — ABNORMAL HIGH (ref 70–99)
Glucose-Capillary: 373 mg/dL — ABNORMAL HIGH (ref 70–99)

## 2018-05-28 MED ORDER — IPRATROPIUM-ALBUTEROL 0.5-2.5 (3) MG/3ML IN SOLN: 3.0000 mL | Freq: Four times a day (QID) | RESPIRATORY_TRACT | 0 refills | Status: DC | PRN

## 2018-05-28 NOTE — Care Management Important Message (Signed)
Important Message  Patient Details  Name: Bradley HAZELRIGG Sr. MRN: [id number] Date of Birth: April 28, 1941   Medicare Important Message Given:  Yes    Elliot Cousin, RN 05/28/2018, 1:18 PM

## 2018-05-28 NOTE — Discharge Summary (Signed)
Physician Discharge Summary  Bradley [name] Sr. NGE:[id number] DOB: May 31, 1941 DOA: 05/25/2018  PCP: [name], MD  Admit date: 05/25/2018 Discharge date: 05/28/2018  Admitted From: Home  Disposition:  Home   Recommendations for Outpatient Follow-up and new medication changes:  1. Follow up with Dr. [name] in 7 days 2. Patient will have home 02 resumed (2LPM) 3. Added duoneb for as needed bronchodilator  Home Health: no  Equipment/Devices: Home 02   Discharge Condition: Stable  CODE STATUS: full  Diet recommendation: Heart healthy and diabetic prudent  Brief/Interim Summary: 77 year old male who presented with dyspnea. He does have a significant past medical history for hypertension, dyslipidemia, diastolic heart failure, anemia, atrial fibrillation status post AV node ablation and biventricular pacemaker implantation, coronary arterydisease and COPD with chronic hypoxic respiratory failure on home oxygen. He also has history for pulmonary hypertension and type 2 diabetes mellitus.He reported 48 hours of worsening dyspnea, associated with PND and progressive dyspnea on exertion. Due to severe symptoms he called EMS, he was found to have a oxygen saturation of 80% on room air.On the initial physical examination blood pressure 133/71, heart rate 71, respiratory rate23, temperature 97.4 and oxygen saturation 93%.He had diminished breath sounds bilaterally, positive wheezing and rales. Heart S1-2 present and rhythmic, abdomen soft nontender, trace lower extremity edema.Sodium 142, potassium 3.5, chloride 103, bicarb 29, glucose 155, BUN 12, creatinine 0.89, white count 7.8, hemoglobin 13.7, hematocrit 40.2,platelets 195.Chest x-ray had cardiomegaly, increased interstitial markings bilaterally, pacemaker in place. EKG 100% paced rhythm.  Patient was admitted to the hospital with working diagnosis of acute on chronic hypoxic respiratory failure due to COPD exacerbation complicated  by acute cardiogenic pulmonary edema, due to acute on chronic diastolic heart failure exacerbation.   1.  Acute on chronic hypoxic respiratory failure due to COPD exacerbation triggered by rhinovirus.  Patient was admitted to the medical ward, he was placed on a remote telemetry monitor, received supplemental oxygen per nasal cannula along with oximetry monitoring.  He was placed on systemic steroids with methylprednisolone, inhaled corticosteroids and aggressive bronchodilator therapy.  He responded well to the medical therapy, and he was deemed stable to be discharged August 18.  Case manager consulted to resume home oxygen.  Patient will continue bronchodilators home with DuoNeb.   2.  Acute on chronic diastolic heart failure exacerbation/cor pulmonale/ pulmomary hypertension with acute cardiogenic pulmonary edema.  Patient received IV furosemide on admission, negative fluid balance was achieved with significant improvement of his symptoms.  Patient was continued on torsemide with good toleration.  Further work-up with echocardiogram showed normal LV systolic function 55 to 60%, the right ventricle cavity was moderately dilated, peak PA pressure was 40 mmHg.  Patient heart failure management with carvedilol and losartan.  3.  Hypertension.  Blood pressure remained well controlled with amlodipine, losartan and carvedilol.  4.  Type 2 diabetes mellitus.  Patient was placed on insulin sliding scale for glucose coverage and monitoring, capillary glucose remained well controlled.  5.  Dyslipidemia.  Continue atorvastatin.  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)   Acute respiratory failure with hypoxia [facility])    Discharge Instructions   Allergies as of 05/28/2018      Reactions   Hydrocodone Other (See Comments)   "given to him in the hospital; went thru withdrawals once home; dr said not to take it again" (09/27/2012)   Tramadol Other (See Comments)   "given to him in the hospital; went thru withdrawals once home; dr said not to take it again" (09/27/2012)   Tadalafil Other (See Comments)   headache, backache   Avelox [moxifloxacin Hydrochloride] Itching, Other (See Comments)   Headache      Medication List    TAKE these medications   amLODipine 10 MG tablet Commonly known as:  NORVASC Take 1 tablet (10 mg total) by mouth daily.   aspirin 81 MG tablet Take 81 mg by mouth every morning.   atorvastatin 20 MG tablet Commonly known as:  LIPITOR Take 1 tablet (20 mg total) by mouth at bedtime.   BESIVANCE 0.6 % Susp Generic drug:  Besifloxacin HCl Place 1 drop into the left eye 3 (three) times daily.   bimatoprost 0.01 % Soln Commonly known as:  LUMIGAN Place 1 drop into the left eye at bedtime.   brimonidine 0.2 % ophthalmic solution Commonly known as:  ALPHAGAN Place 1 drop into the left eye 3 (three) times daily.   calcium-vitamin D 500-200 MG-UNIT tablet Commonly known as:  OSCAL WITH D Take 1 tablet by mouth every morning.   carvedilol 25 MG tablet Commonly known as:  COREG Take 1 tablet (25 mg total) by mouth 2 (two) times daily with a meal.   DUREZOL 0.05 % Emul Generic drug:  Difluprednate Place 1 drop into the left eye 3 (three) times daily.   ipratropium-albuterol 0.5-2.5 (3) MG/3ML Soln Commonly known as:  DUONEB Take 3 mLs by nebulization every 6 (six) hours as needed (for shortness of  breath).   losartan 100 MG tablet Commonly known as:  COZAAR Take 0.5 tablets (50 mg total) by mouth daily. What changed:  when to take this   multivitamins ther. w/minerals Tabs tablet Take 1 tablet by mouth daily.   nitroGLYCERIN 0.4 MG SL tablet Commonly known as:  NITROSTAT DISSOLVE 1 TABLET UNDER THE TONGUE EVERY 5 MINUTES AS NEEDED FOR CHEST PAIN What changed:  See the new instructions.   onetouch ultrasoft lancets Check blood sugars no more than twice daily   ONETOUCH VERIO test strip Generic drug:  glucose blood CHECK BLOOD SUGAR NO MORE THAN TWICE A DAY   pantoprazole 40 MG tablet Commonly known as:  PROTONIX Take 1 tablet (40 mg total) by mouth every morning.   PROLENSA 0.07 % Soln Generic drug:  Bromfenac Sodium Place 1 drop into the left eye at bedtime.   sitaGLIPtin 100 MG tablet Commonly known as:  JANUVIA Take 1 tablet (100 mg total) by mouth daily.   tamsulosin 0.4 MG Caps capsule Commonly known as:  FLOMAX Take 1 capsule (0.4 mg total) by mouth daily.   torsemide 20 MG tablet Commonly known as:  DEMADEX Take 3 tablets (60 mg total) by mouth 2 (two) times daily.   zolpidem 10 MG tablet Commonly known as:  AMBIEN Take 1 tablet (10 mg total) by mouth at bedtime as needed for sleep.            Durable  Medical Equipment  (From admission, onward)         Start     Ordered   05/28/18 0931  For home use only DME oxygen  Once    Question Answer Comment  Mode or (Route) Nasal cannula   Liters per Minute 2   Frequency Continuous (stationary and portable oxygen unit needed)   Oxygen conserving device Yes   Oxygen delivery system Gas      05/28/18 0930   05/28/18 0000  DME Nebulizer machine    Question:  Patient needs a nebulizer to treat with the following condition  Answer:  COPD exacerbation (HCC)   05/28/18 1035          Allergies  Allergen Reactions  . Hydrocodone Other (See Comments)    "given to him in the hospital; went thru  withdrawals once home; dr said not to take it again" (09/27/2012)  . Tramadol Other (See Comments)    "given to him in the hospital; went thru withdrawals once home; dr said not to take it again" (09/27/2012)  . Tadalafil Other (See Comments)    headache, backache  . Avelox [Moxifloxacin Hydrochloride] Itching and Other (See Comments)    Headache    Consultations:     Procedures/Studies: Dg Chest 2 View  Result Date: 05/26/2018 CLINICAL DATA:  Shortness of breath in a diabetic patient. EXAM: CHEST - 2 VIEW COMPARISON:  Single-view of the chest 05/25/2018. PA and lateral chest 02/21/2017. FINDINGS: There is cardiomegaly. Aortic atherosclerosis is identified. Pacing device in place. There is some coarsening of the pulmonary interstitium in the lower lung zones bilaterally which is chronic. No consolidative process, pneumothorax or effusion. IMPRESSION: Cardiomegaly without acute disease. Atherosclerosis. Electronically Signed   By: Drusilla Kanner M.D.   On: 05/26/2018 09:45   Dg Chest Portable 1 View  Result Date: 05/25/2018 CLINICAL DATA:  Sob; hx COPD, CHF, htn, heart murmur; former smoker EXAM: PORTABLE CHEST 1 VIEW COMPARISON:  02/21/2017 FINDINGS: Pacer noted with lead positioning unchanged from prior. Atherosclerotic calcification of the aortic arch. Mild tortuosity of the thoracic aorta. Right heart enlargement. Mild interstitial accentuation. Bandlike opacities at the left lung base. Mildly blunted costophrenic angles. Thoracic spondylosis.  Spurring of both humeral heads. IMPRESSION: 1. Mild cardiomegaly with interstitial accentuation and some bandlike opacities at the left lung base. The appearance favors mild congestive heart failure. The left basilar opacity could represent confluent edema, early pneumonia, or atelectasis. 2.  Aortic Atherosclerosis (ICD10-I70.0). 3. Pacer noted. Electronically Signed   By: Gaylyn Rong M.D.   On: 05/25/2018 09:02        Subjective: Patient is feeling back to his baseline, no nausea or vomiting, no chest pain.   Discharge Exam: Vitals:   05/28/18 0745 05/28/18 0939  BP:  104/63  Pulse:  70  Resp:    Temp:    SpO2: 95%    Vitals:   05/27/18 2136 05/28/18 0539 05/28/18 0745 05/28/18 0939  BP: 116/65 130/80  104/63  Pulse: 70 71  70  Resp: 18 16    Temp: 98 F (36.7 C) 97.7 F (36.5 C)    TempSrc: Oral Oral    SpO2: 96% 97% 95%   Weight:  85.9 kg    Height:        General: Not in pain or dyspnea  Neurology: Awake and alert, non focal  E ENT: no pallor, no icterus, oral mucosa moist Cardiovascular: No JVD. S1-S2 present, rhythmic, no gallops, rubs, or murmurs. No  lower extremity edema. Pulmonary: positive breath sounds bilaterally, decreased air movement, mild expiratory wheezing, with no rhonchi or rales. Gastrointestinal. Abdomen with no organomegaly, non tender, no rebound or guarding Skin. No rashes Musculoskeletal: no joint deformities   The results of significant diagnostics from this hospitalization (including imaging, microbiology, ancillary and laboratory) are listed below for reference.     Microbiology: Recent Results (from the past 240 hour(s))  Respiratory Panel by PCR     Status: Abnormal   Collection Time: 05/25/18  4:27 PM  Result Value Ref Range Status   Adenovirus NOT DETECTED NOT DETECTED Final   Coronavirus 229E NOT DETECTED NOT DETECTED Final   Coronavirus HKU1 NOT DETECTED NOT DETECTED Final   Coronavirus NL63 NOT DETECTED NOT DETECTED Final   Coronavirus OC43 NOT DETECTED NOT DETECTED Final   Metapneumovirus NOT DETECTED NOT DETECTED Final   Rhinovirus / Enterovirus DETECTED (A) NOT DETECTED Final   Influenza A NOT DETECTED NOT DETECTED Final   Influenza B NOT DETECTED NOT DETECTED Final   Parainfluenza Virus 1 NOT DETECTED NOT DETECTED Final   Parainfluenza Virus 2 NOT DETECTED NOT DETECTED Final   Parainfluenza Virus 3 NOT DETECTED NOT DETECTED  Final   Parainfluenza Virus 4 NOT DETECTED NOT DETECTED Final   Respiratory Syncytial Virus NOT DETECTED NOT DETECTED Final   Bordetella pertussis NOT DETECTED NOT DETECTED Final   Chlamydophila pneumoniae NOT DETECTED NOT DETECTED Final   Mycoplasma pneumoniae NOT DETECTED NOT DETECTED Final    Comment: Performed at [facility], [address], [place], [place] [address]  Culture, blood (routine x 2) Call MD if unable to obtain prior to antibiotics being given     Status: None (Preliminary result)   Collection Time: 05/25/18  4:45 PM  Result Value Ref Range Status   Specimen Description   Final    BLOOD LEFT ANTECUBITAL Performed at [facility], [address] [place], [place] [address]    Special Requests   Final    BOTTLES DRAWN AEROBIC ONLY Blood Culture adequate volume Performed at [facility], [address] [place], [place] [address]    Culture   Final    NO GROWTH 2 DAYS Performed at [facility], [address] [place], [place] [address]    Report Status PENDING  Incomplete  Culture, blood (routine x 2) Call MD if unable to obtain prior to antibiotics being given     Status: None (Preliminary result)   Collection Time: 05/25/18  4:47 PM  Result Value Ref Range Status   Specimen Description   Final    BLOOD BLOOD LEFT FOREARM Performed at [facility], [address] [name], [place] [address]    Special Requests   Final    BOTTLES DRAWN AEROBIC AND ANAEROBIC Blood Culture adequate volume Performed at [facility], [address] [place], [place] [address]    Culture   Final    NO GROWTH 2 DAYS Performed at [facility], [address] [place], [place] [address]    Report Status PENDING  Incomplete     Labs: BNP (last 3 results) Recent Labs    05/25/18 0842  BNP 307.9*   Basic Metabolic Panel: Recent Labs  Lab 05/25/18 0842 05/26/18 0535 05/27/18 0518  05/28/18 0522  NA 142 142 140 142  K 3.5 4.2 3.8 3.9  CL 103 101 99 97*  CO2 29 32 34* 35*  GLUCOSE 155*  210* 147* 131*  BUN 12 21 27* 25*  CREATININE 0.89 0.91 1.06 0.79  CALCIUM 8.3* 8.9 8.6* 8.6*  MG  --  2.3  --   --   PHOS  --  3.4  --   --    Liver Function Tests: Recent Labs  Lab 05/25/18 0842 05/26/18 0535  AST 16 16  ALT 11 13  ALKPHOS 98 95  BILITOT 1.4* 0.8  PROT 6.7 6.9  ALBUMIN 3.5 3.4*   No results for input(s): LIPASE, AMYLASE in the last 168 hours. No results for input(s): AMMONIA in the last 168 hours. CBC: Recent Labs  Lab 05/25/18 0842 05/26/18 0535  WBC 7.8 8.2  NEUTROABS 6.2 7.2  HGB 13.7 14.7  HCT 40.2 43.4  MCV 91.0 90.2  PLT 195 208   Cardiac Enzymes: Recent Labs  Lab 05/25/18 0842  TROPONINI <0.03   BNP: Invalid input(s): POCBNP CBG: Recent Labs  Lab 05/27/18 0742 05/27/18 1200 05/27/18 1609 05/27/18 2139 05/28/18 0738  GLUCAP 111* 247* 236* 216* 161*   D-Dimer No results for input(s): DDIMER in the last 72 hours. Hgb A1c No results for input(s): HGBA1C in the last 72 hours. Lipid Profile No results for input(s): CHOL, HDL, LDLCALC, TRIG, CHOLHDL, LDLDIRECT in the last 72 hours. Thyroid function studies Recent Labs    05/25/18 1646  TSH 0.300*   Anemia work up No results for input(s): VITAMINB12, FOLATE, FERRITIN, TIBC, IRON, RETICCTPCT in the last 72 hours. Urinalysis    Component Value Date/Time   COLORURINE YELLOW 07/15/2015 1440   APPEARANCEUR CLEAR 07/15/2015 1440   LABSPEC 1.015 07/15/2015 1440   PHURINE 6.0 07/15/2015 1440   GLUCOSEU NEGATIVE 07/15/2015 1440   HGBUR TRACE-LYSED (A) 07/15/2015 1440   BILIRUBINUR NEGATIVE 07/15/2015 1440   KETONESUR TRACE (A) 07/15/2015 1440   PROTEINUR NEGATIVE 07/06/2012 0341   UROBILINOGEN 1.0 07/15/2015 1440   NITRITE NEGATIVE 07/15/2015 1440   LEUKOCYTESUR NEGATIVE 07/15/2015 1440   Sepsis Labs Invalid input(s): PROCALCITONIN,  WBC,   LACTICIDVEN Microbiology Recent Results (from the past 240 hour(s))  Respiratory Panel by PCR     Status: Abnormal   Collection Time: 05/25/18  4:27 PM  Result Value Ref Range Status   Adenovirus NOT DETECTED NOT DETECTED Final   Coronavirus 229E NOT DETECTED NOT DETECTED Final   Coronavirus HKU1 NOT DETECTED NOT DETECTED Final   Coronavirus NL63 NOT DETECTED NOT DETECTED Final   Coronavirus OC43 NOT DETECTED NOT DETECTED Final   Metapneumovirus NOT DETECTED NOT DETECTED Final   Rhinovirus / Enterovirus DETECTED (A) NOT DETECTED Final   Influenza A NOT DETECTED NOT DETECTED Final   Influenza B NOT DETECTED NOT DETECTED Final   Parainfluenza Virus 1 NOT DETECTED NOT DETECTED Final   Parainfluenza Virus 2 NOT DETECTED NOT DETECTED Final   Parainfluenza Virus 3 NOT DETECTED NOT DETECTED Final   Parainfluenza Virus 4 NOT DETECTED NOT DETECTED Final   Respiratory Syncytial Virus NOT DETECTED NOT DETECTED Final   Bordetella pertussis NOT DETECTED NOT DETECTED Final   Chlamydophila pneumoniae NOT DETECTED NOT DETECTED Final   Mycoplasma pneumoniae NOT DETECTED NOT DETECTED Final    Comment: Performed at [facility], [address], [place], [place] [address]  Culture, blood (routine x 2) Call MD if unable to obtain prior to antibiotics being given     Status: None (Preliminary result)   Collection Time: 05/25/18  4:45 PM  Result Value Ref Range Status   Specimen Description   Final  BLOOD LEFT ANTECUBITAL Performed at [facility], [address], [place], [place] [address]    Special Requests   Final    BOTTLES DRAWN AEROBIC ONLY Blood Culture adequate volume Performed at [facility], [address], [place], [place] [address]    Culture   Final    NO GROWTH 2 DAYS Performed at [facility], [address] [place], [place] [address]    Report Status PENDING  Incomplete  Culture, blood (routine x 2) Call MD if unable to obtain  prior to antibiotics being given     Status: None (Preliminary result)   Collection Time: 05/25/18  4:47 PM  Result Value Ref Range Status   Specimen Description   Final    BLOOD BLOOD LEFT FOREARM Performed at [facility], [address], [place], [place] [address]    Special Requests   Final    BOTTLES DRAWN AEROBIC AND ANAEROBIC Blood Culture adequate volume Performed at [facility], [address], [place], [place] [address]    Culture   Final    NO GROWTH 2 DAYS Performed at [facility], [address], [place], [place] [address]    Report Status PENDING  Incomplete     Time coordinating discharge: 45 minutes  SIGNED:   Coralie Keens, MD  [facility] 05/28/2018, 10:21 AM Pager [phone]  If 7PM-7AM, please contact night-coverage [email or web address] Password [email or web address]

## 2018-05-28 NOTE — Progress Notes (Addendum)
SATURATION QUALIFICATIONS: (This note is used to comply with regulatory documentation for home oxygen)  Patient Saturations on Room Air at Rest = 88%  Patient Saturations on Room Air while Ambulating = 84%  Patient Saturations on 2 Liters of oxygen while Ambulating = 94%  Please briefly explain why patient needs home oxygen: Patient deats to 88% on room air; desats to 84% with only a few steps.  Recovers to 94% on 2L via nasal cannula

## 2018-05-28 NOTE — Care Management Note (Addendum)
Case Management Note  Patient Details  Name: SWANSON KOSTOFF Sr. MRN: [id number] Date of Birth: 1941-03-11  Subjective/Objective:    COPD exac, CHF                Action/Plan: NCM spoke to pt and offered choice for HH/list provided. Pt agreeable to [facility] for HH. Contacted [facility] for oxygen and neb machine for home. [facility] will deliver portable to room prior to dc. And DME oxygen concentrator to home once he arrived. [facility] will provide [facility] RN.   Expected Discharge Date:  05/28/18               Expected Discharge Plan:  Home w Home Health Services  In-House Referral:  NA  Discharge planning Services  CM Consult  Post Acute Care Choice:  Home Health Choice offered to:  Patient  DME Arranged:  Oxygen, Nebulizer machine DME Agency:  [facility]  HH Arranged:  RN, Disease Management HH Agency:  [facility]  Status of Service:  Completed, signed off  If discussed at Long Length of Stay Meetings, dates discussed:    Additional Comments:  Elliot Cousin, RN 05/28/2018, 12:32 PM

## 2018-05-29 ENCOUNTER — Telehealth: Payer: Self-pay

## 2018-05-29 ENCOUNTER — Ambulatory Visit (INDEPENDENT_AMBULATORY_CARE_PROVIDER_SITE_OTHER): Payer: Medicare Other | Admitting: *Deleted

## 2018-05-29 ENCOUNTER — Telehealth: Payer: Self-pay | Admitting: Internal Medicine

## 2018-05-29 DIAGNOSIS — I495 Sick sinus syndrome: Secondary | ICD-10-CM | POA: Diagnosis not present

## 2018-05-29 DIAGNOSIS — I509 Heart failure, unspecified: Secondary | ICD-10-CM

## 2018-05-29 NOTE — Telephone Encounter (Signed)
I called pt back but I did not get an answer and could not leave a message. I will try again later today.

## 2018-05-29 NOTE — Telephone Encounter (Signed)
New Message ° ° ° °1. Has your device fired? no ° °2. Is you device beeping? no ° °3. Are you experiencing draining or swelling at device site? no ° °4. Are you calling to see if we received your device transmission? yes ° °5. Have you passed out? no ° ° ° °Please route to Device Clinic Pool ° °

## 2018-05-29 NOTE — Telephone Encounter (Signed)
Today  Transition Care Management Follow-up Telephone Call  ADMISSION DATE: 05/25/18 DISCHARGE DATE: 05/28/18  How have you been since you were released from the hospital? Feeling excellent per patient.   Do you understand why you were in the hospital? Yes   Do you understand the discharge instrcutions? Yes    Items Reviewed:  Medications reviewed: Patient states everything I have on my list was up to date.   Allergies reviewed: Yes no new allergies per patient.  Dietary changes reviewed:Heart healthy Diabetic prudent.  Referrals reviewed: Appointment for follow up has been  scheduled   Functional Questionnaire:   Activities of Daily Living (ADLs): Patient states he can perform all independently.  Any patient concerns? Patient would like to know what causes infection in stomach.   Confirmed importance and date/time of follow-up visits scheduled:Yes   Confirmed with patient if condition begins to worsen call PCP or go to the ER. Yes   Patient was given the office number and encouragred to call back with questions or concerns. Yes

## 2018-05-30 LAB — CULTURE, BLOOD (ROUTINE X 2)
Culture: NO GROWTH
Culture: NO GROWTH
Special Requests: ADEQUATE
Special Requests: ADEQUATE

## 2018-05-30 NOTE — Progress Notes (Signed)
Remote pacemaker transmission.   

## 2018-05-30 NOTE — Telephone Encounter (Signed)
Transmission received.

## 2018-05-31 ENCOUNTER — Other Ambulatory Visit: Payer: Self-pay

## 2018-05-31 ENCOUNTER — Telehealth: Payer: Self-pay | Admitting: *Deleted

## 2018-05-31 NOTE — Telephone Encounter (Signed)
Received Physician Orders from [facility]; forwarded to provider/SLS 08/21  

## 2018-05-31 NOTE — Patient Outreach (Signed)
[facility]) Care Management  05/31/2018  Bradley [place] Sr. 31-Jan-1941 [id number]   EMMI- General Discharge RED ON EMMI ALERT Day # 1 Date: 05/30/18 Red Alert Reason:  Know who to call about changes in condition? No  Outreach attempt: spoke with patient who states it is not a good time right now and asked for a call back.  Advised patient that CM will attempt again on tomorrow.  He verbalized understanding.   Plan: RN CM will send letter and attempt patient again within 4 business days.    Bary Leriche, RN, MSN [facility] Care Management Care Management Coordinator Direct Line [phone] Toll Free: [phone]  Fax: [phone]

## 2018-06-01 ENCOUNTER — Other Ambulatory Visit: Payer: Self-pay

## 2018-06-01 NOTE — Patient Outreach (Signed)
[facility]) Care Management  06/01/2018  Bradley Searles Sr. [birth year] [id number]   EMMI- General Discharge RED ON EMMI ALERT Day # 1 Date:05/30/18 Red Alert Reason: Know who to call about changes in condition? No   Outreach attempt #2.  Spoke with patient.  He is able to verify HIPAA.  Patient reports that he is doing good now.  Discussed red alert.  Patient states he is aware of what doctor to call if he has problems.  Patient noted to have appointment with physician on next week.  Discussed [facility] services for support of COPD and other health issues.  Patient declined services and states that the home health nurse is to come out on tomorrow.  Advised patient that letter and brochure was sent to him and that he could review the brochure and if he needed our services he could give [facility] a call at a later time.  He verbalized understanding.     Plan: RN CM will close case.  Bary Leriche, RN, MSN [facility] Care Management Care Management Coordinator Direct Line [phone] Toll Free: [phone]  Fax: [phone]

## 2018-06-06 ENCOUNTER — Other Ambulatory Visit: Payer: Self-pay | Admitting: Internal Medicine

## 2018-06-07 ENCOUNTER — Encounter: Payer: Self-pay | Admitting: Internal Medicine

## 2018-06-07 ENCOUNTER — Ambulatory Visit (INDEPENDENT_AMBULATORY_CARE_PROVIDER_SITE_OTHER): Payer: Medicare Other | Admitting: Internal Medicine

## 2018-06-07 VITALS — BP 110/64 | HR 87 | Temp 97.6°F | Resp 18 | Ht 69.0 in | Wt 202.0 lb

## 2018-06-07 DIAGNOSIS — J209 Acute bronchitis, unspecified: Secondary | ICD-10-CM

## 2018-06-07 DIAGNOSIS — J9621 Acute and chronic respiratory failure with hypoxia: Secondary | ICD-10-CM | POA: Diagnosis not present

## 2018-06-07 DIAGNOSIS — R7989 Other specified abnormal findings of blood chemistry: Secondary | ICD-10-CM

## 2018-06-07 DIAGNOSIS — E0590 Thyrotoxicosis, unspecified without thyrotoxic crisis or storm: Secondary | ICD-10-CM | POA: Diagnosis not present

## 2018-06-07 DIAGNOSIS — J449 Chronic obstructive pulmonary disease, unspecified: Secondary | ICD-10-CM

## 2018-06-07 DIAGNOSIS — I10 Essential (primary) hypertension: Secondary | ICD-10-CM

## 2018-06-07 DIAGNOSIS — E119 Type 2 diabetes mellitus without complications: Secondary | ICD-10-CM

## 2018-06-07 DIAGNOSIS — J441 Chronic obstructive pulmonary disease with (acute) exacerbation: Secondary | ICD-10-CM

## 2018-06-07 DIAGNOSIS — J440 Chronic obstructive pulmonary disease with acute lower respiratory infection: Secondary | ICD-10-CM
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
## 2018-06-07 NOTE — Patient Instructions (Addendum)
GO TO THE LAB : Get the blood work     GO TO THE FRONT DESK Schedule your next appointment for a checkup in 3 or 4 months  
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
## 2018-06-08 ENCOUNTER — Encounter: Payer: Self-pay | Admitting: Cardiovascular Disease

## 2018-06-08 ENCOUNTER — Ambulatory Visit (INDEPENDENT_AMBULATORY_CARE_PROVIDER_SITE_OTHER): Payer: Medicare Other | Admitting: Cardiovascular Disease

## 2018-06-08 VITALS — BP 116/74 | HR 85 | Ht 69.0 in | Wt 200.6 lb

## 2018-06-08 DIAGNOSIS — I10 Essential (primary) hypertension: Secondary | ICD-10-CM | POA: Diagnosis not present

## 2018-06-08 DIAGNOSIS — I25810 Atherosclerosis of coronary artery bypass graft(s) without angina pectoris: Secondary | ICD-10-CM | POA: Diagnosis not present

## 2018-06-08 DIAGNOSIS — I482 Chronic atrial fibrillation, unspecified: Secondary | ICD-10-CM

## 2018-06-08 DIAGNOSIS — Z950 Presence of cardiac pacemaker: Secondary | ICD-10-CM

## 2018-06-08 DIAGNOSIS — I5032 Chronic diastolic (congestive) heart failure: Secondary | ICD-10-CM | POA: Diagnosis not present

## 2018-06-08 LAB — BASIC METABOLIC PANEL
BUN: 12 mg/dL (ref 6–23)
CO2: 33 mEq/L — ABNORMAL HIGH (ref 19–32)
Calcium: 8.5 mg/dL (ref 8.4–10.5)
Chloride: 100 mEq/L (ref 96–112)
Creatinine, Ser: 0.97 mg/dL (ref 0.40–1.50)
GFR: 79.7 mL/min (ref >60.00)
Glucose, Bld: 74 mg/dL (ref 70–99)
Potassium: 3.8 mEq/L (ref 3.5–5.1)
Sodium: 142 mEq/L (ref 135–145)

## 2018-06-08 LAB — TSH: TSH: 1.61 u[IU]/mL (ref 0.35–4.50)
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
## 2018-06-08 NOTE — Patient Instructions (Signed)
Medication Instructions:  °Your physician recommends that you continue on your current medications as directed. Please refer to the Current Medication list given to you today.  ° °Labwork: °NONE ° °Testing/Procedures: °NONE ° °Follow-Up: °Your physician wants you to follow-up in: 6 MONTHS  You will receive a reminder letter in the mail two months in advance. If you don't receive a letter, please call our office to schedule the follow-up appointment. ° °If you need a refill on your cardiac medications before your next appointment, please call your pharmacy. °

## 2018-06-14 ENCOUNTER — Telehealth: Payer: Self-pay | Admitting: *Deleted

## 2018-06-14 ENCOUNTER — Telehealth: Payer: Self-pay

## 2018-06-14 NOTE — Telephone Encounter (Signed)
Received Home Health Certification and Plan of Care; forwarded to provider/SLS 09/04  

## 2018-06-14 NOTE — Telephone Encounter (Signed)
PA approved.

## 2018-06-14 NOTE — Telephone Encounter (Signed)
PA initiated via Covermymeds; KEY: [id number]. Awaiting determination.

## 2018-06-15 NOTE — Telephone Encounter (Signed)
Plan of care signed and faxed to [facility] at [phone]. Form sent for scanning.  

## 2018-06-17 ENCOUNTER — Other Ambulatory Visit: Payer: Self-pay | Admitting: Internal Medicine

## 2018-06-23 LAB — CUP PACEART REMOTE DEVICE CHECK
Battery Remaining Longevity: 48 mo
Battery Voltage: 2.96 V
Brady Statistic AP VP Percent: 0 %
Brady Statistic AP VS Percent: 0 %
Brady Statistic AS VP Percent: 98.58 %
Brady Statistic AS VS Percent: 1.42 %
Brady Statistic RA Percent Paced: 0 %
Brady Statistic RV Percent Paced: 99.37 %
Date Time Interrogation Session: 20190819180035
Implantable Lead Implant Date: 20121128
Implantable Lead Implant Date: 20121128
Implantable Lead Location: 753858
Implantable Lead Location: 753860
Implantable Lead Model: 4194
Implantable Lead Model: 5076
Implantable Pulse Generator Implant Date: 20121128
Lead Channel Impedance Value: 342 Ohm
Lead Channel Impedance Value: 4047 Ohm
Lead Channel Impedance Value: 4047 Ohm
Lead Channel Impedance Value: 418 Ohm
Lead Channel Impedance Value: 475 Ohm
Lead Channel Impedance Value: 494 Ohm
Lead Channel Impedance Value: 570 Ohm
Lead Channel Impedance Value: 589 Ohm
Lead Channel Impedance Value: 589 Ohm
Lead Channel Pacing Threshold Amplitude: 0.625 V
Lead Channel Pacing Threshold Amplitude: 0.875 V
Lead Channel Pacing Threshold Pulse Width: 0.4 ms
Lead Channel Pacing Threshold Pulse Width: 0.4 ms
Lead Channel Sensing Intrinsic Amplitude: 13.875 mV
Lead Channel Sensing Intrinsic Amplitude: 13.875 mV
Lead Channel Setting Pacing Amplitude: 2 V
Lead Channel Setting Pacing Amplitude: 2 V
Lead Channel Setting Pacing Pulse Width: 0.4 ms
Lead Channel Setting Pacing Pulse Width: 0.4 ms
Lead Channel Setting Sensing Sensitivity: 4 mV

## 2018-08-03 ENCOUNTER — Ambulatory Visit (INDEPENDENT_AMBULATORY_CARE_PROVIDER_SITE_OTHER): Payer: Medicare Other | Admitting: Internal Medicine

## 2018-08-03 ENCOUNTER — Encounter: Payer: Self-pay | Admitting: Internal Medicine

## 2018-08-03 VITALS — BP 116/70 | HR 58 | Temp 97.9°F | Resp 16 | Ht 69.0 in | Wt 202.1 lb

## 2018-08-03 DIAGNOSIS — I25810 Atherosclerosis of coronary artery bypass graft(s) without angina pectoris: Secondary | ICD-10-CM

## 2018-08-03 DIAGNOSIS — J9611 Chronic respiratory failure with hypoxia: Secondary | ICD-10-CM | POA: Diagnosis not present

## 2018-08-03 DIAGNOSIS — E1159 Type 2 diabetes mellitus with other circulatory complications: Secondary | ICD-10-CM | POA: Diagnosis not present

## 2018-08-03 DIAGNOSIS — Z23 Encounter for immunization: Secondary | ICD-10-CM

## 2018-08-03 LAB — HEMOGLOBIN A1C: Hgb A1c MFr Bld: 6.3 % (ref 4.6–6.5)

## 2018-08-03 NOTE — Progress Notes (Signed)
Subjective:    Patient ID: Bradley Sit., male    DOB: Oct 09, 1941, 77 y.o.   MRN: [id number]  DOS:  08/03/2018 Type of visit - description :  f/u Interval history: Here with his [name], he has no major concerns, good compliance with medications. Takes oxygen with him most of the time, did not bring it with him today.   Review of Systems Upon arrival, oxygen was low, he was only slightly more short of breath than baseline.  In no distress. Denies fever, chills, cough. No lower extremity edema No sputum production.   Past Medical History:  Diagnosis Date  . Abnormal CT scan, chest 2012   CT chest, several lymphadenopathies. Sees pulmonary  . Anemia    intermittent  . Arthritis    "?back" (08/22/2014)  . Atrial fibrillation (HCC)    s/p AV node ablation & BiV PPM implantation 09/08/11 (op dictation pending)  . BPH (benign prostatic hyperplasia)    Saw Dr [name] 2004, normal renal u/s  . Bronchitis 08/26/2017  . CAD (coronary artery disease)   . CHF (congestive heart failure) (HCC)    Thought primarily to be non-systolic although EF down (EF 99-24% 12/2010, down to 35-40% 09/05/11), cath 2008 with no CAD, nuclear study 07/2011 showing Small area of reversibility in the distal ant/lat wall the left ventricle suspicious for ischemia/septal wall HK but felt to be low risk  (per D/C Summary 07/2011)  . Chronic bronchitis (HCC)    "get it ~ q yr"  . Heart murmur   . HLD (hyperlipidemia)   . HTN (hypertension)   . 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 Insomnia   . Migraines    "very very rare"  . On home oxygen therapy    at night as needed  . Pacemaker   . Peripheral vascular disease (HCC)    ??  . Pleural effusion 2008   S/p decortication  . Pulmonary HTN (HCC)    per cath 2008  . Type II diabetes mellitus (HCC) 1999    Past Surgical History:  Procedure Laterality Date  . BI-VENTRICULAR PACEMAKER INSERTION Left 09/08/2011   Procedure: BI-VENTRICULAR PACEMAKER INSERTION (CRT-P);  Surgeon: Marinus Maw, MD;  Location: [facility] CATH LAB;  Service: Cardiovascular;  Laterality: Left;  . CARDIAC CATHETERIZATION     "couple times; never had balloon or stent" (09/27/2012)  . COLONOSCOPY  03/10/11   normal  . EYE SURGERY  2019   cataract  . INSERT / REPLACE / REMOVE PACEMAKER  09/08/11   pacemaker placement  . LUNG DECORTICATION    . PLEURAL SCARIFICATION    . pneumothorax with fibrothorax  ~ 2010  . TONSILLECTOMY     "as a kid" (09/27/2012)    Social History   Socioeconomic History  . Marital status: Married    Spouse name: [place] [name]  . Number of children: 1  . Years of education: Not on file  . Highest education level: Not on file  Occupational History  . Occupation: retired Publishing rights manager: RETIRED  Social Needs  . Financial resource strain: Not hard at all  . Food insecurity:    Worry: Never true    Inability: Never true  . Transportation needs:    Medical: No    Non-medical: No  Tobacco Use  . Smoking status: Former Smoker    Packs/day: 2.50    Years: 25.00    Pack years: 62.50  Types: Cigarettes    Last attempt to quit: 10/11/1980    Years since quitting: 37.8  . Smokeless tobacco: Never Used  . Tobacco comment: used to smoke 2.5 ppd for 25 years  Substance and Sexual Activity  . Alcohol use: No    Alcohol/week: 0.0 standard drinks    Comment: 08/22/2014 "maybe q couple months I'll have a beer w/dinner"  . Drug use: No  . Sexual activity: Not Currently  Lifestyle  . Physical activity:    Days per week: 6 days    Minutes per session: 20 min  . Stress: Not at all  Relationships  . Social connections:    Talks on phone: More than three times a week    Gets together: More than three times a week    Attends religious service: More than 4 times per year    Active member of club or organization: No    Attends meetings of clubs or organizations: Never    Relationship status:  Married  . Intimate partner violence:    Fear of current or ex partner: Not on file    Emotionally abused: Not on file    Physically abused: Not on file    Forced sexual activity: Not on file  Other Topics Concern  . Not on file  Social History Narrative   [place]oved from [name]   He lives w/ his [name], [name]   1 son, substance abuse, passed away 2016-09-25            Allergies as of 08/03/2018      Reactions   Hydrocodone Other (See Comments)   "given to him in the hospital; went thru withdrawals once home; dr said not to take it again" (09/27/2012)   Tramadol Other (See Comments)   "given to him in the hospital; went thru withdrawals once home; dr said not to take it again" (09/27/2012)   Tadalafil Other (See Comments)   headache, backache   Avelox [moxifloxacin Hydrochloride] Itching, Other (See Comments)   Headache      Medication List        Accurate as of 08/03/18 11:59 PM. Always use your most recent med list.          amLODipine 10 MG tablet Commonly known as:  NORVASC Take 1 tablet (10 mg total) by mouth daily.   aspirin 81 MG tablet Take 81 mg by mouth every morning.   atorvastatin 20 MG tablet Commonly known as:  LIPITOR Take 1 tablet (20 mg total) by mouth at bedtime.   BESIVANCE 0.6 % Susp Generic drug:  Besifloxacin HCl Place 1 drop into the right eye 3 (three) times daily.   bimatoprost 0.01 % Soln Commonly known as:  LUMIGAN Place 1 drop into the right eye at bedtime.   brimonidine 0.2 % ophthalmic solution Commonly known as:  ALPHAGAN Place 1 drop into the right eye 3 (three) times daily.   calcium-vitamin D 500-200 MG-UNIT tablet Commonly known as:  OSCAL WITH D Take 1 tablet by mouth every morning.   carvedilol 25 MG tablet Commonly known as:  COREG TAKE 1 TABLET TWICE A DAY WITH MEALS   DUREZOL 0.05 % Emul Generic drug:  Difluprednate Place 1 drop into the right eye 3 (three) times daily.   ipratropium-albuterol 0.5-2.5 (3)  MG/3ML Soln Commonly known as:  DUONEB Take 3 mLs by nebulization every 6 (six) hours as needed (for shortness of breath).   losartan 100 MG tablet Commonly known as:  COZAAR Take 0.5 tablets (50 mg total) by mouth daily.   multivitamins ther. w/minerals Tabs tablet Take 1 tablet by mouth daily.   nitroGLYCERIN 0.4 MG SL tablet Commonly known as:  NITROSTAT DISSOLVE 1 TABLET UNDER THE TONGUE EVERY 5 MINUTES AS NEEDED FOR CHEST PAIN   onetouch ultrasoft lancets Check blood sugars no more than twice daily   ONETOUCH VERIO test strip Generic drug:  glucose blood CHECK BLOOD SUGAR NO MORE THAN TWICE A DAY   pantoprazole 40 MG tablet Commonly known as:  PROTONIX Take 1 tablet (40 mg total) by mouth every morning.   PROLENSA 0.07 % Soln Generic drug:  Bromfenac Sodium Place 1 drop into the right eye at bedtime.   sitaGLIPtin 100 MG tablet Commonly known as:  JANUVIA Take 1 tablet (100 mg total) by mouth daily.   tamsulosin 0.4 MG Caps capsule Commonly known as:  FLOMAX Take 1 capsule (0.4 mg total) by mouth daily.   torsemide 20 MG tablet Commonly known as:  DEMADEX Take 3 tablets (60 mg total) by mouth 2 (two) times daily.   zolpidem 10 MG tablet Commonly known as:  AMBIEN Take 1 tablet (10 mg total) by mouth at bedtime as needed for sleep.          Objective:   Physical Exam BP 116/70 (BP Location: Left Arm, Patient Position: Sitting, Cuff Size: Small)   Pulse (!) 58   Temp 97.9 F (36.6 C) (Oral)   Resp 16   Ht 5\' 9"  (1.753 m)   Wt 202 lb 2 oz (91.7 kg)   SpO2 92%   BMI 29.85 kg/m  General:   Well developed, NAD, see BMI.  HEENT:  Normocephalic . Face symmetric, atraumatic Lungs:  Decreased breath sounds Normal respiratory effort, no intercostal retractions, no accessory muscle use. Heart: RRR,  no murmur.  No pretibial edema bilaterally  Skin: Not pale. Not jaundice Neurologic:  alert & oriented X3.  Speech normal, gait appropriate for age and  unassisted Psych--  Cognition and judgment appear intact.  Cooperative with normal attention span and concentration.  Behavior appropriate. No anxious or depressed appearing.      Assessment & Plan:   Assessment  DM HTN Hyperlipidemia Anxiety- insomnia  BPH Cardiovascular:Dr Ladona Ridgel  --CAD -- CHF --Atrial fibrillation;  --Pacemaker --h/o IC bleeding: Not anticoagulated --Peripheral vascular disease? Pulmonary: Dr [name], last OV 10-18-2014, f/u prn --COPD, severe, chronic resp failure, night O2 prn, has portable O2  --Pleural effusion s/p decortication --Pulmonary hypertension  Stasis dermatitis   PLAN: DM Continue with Januvia, check a A1c. Chronic respiratory failure: O2 sat was 86% when he arrived to the office without oxygen supplementation, after he rested, O2 sat was 92%.  Encouraged to use his oxygen at all times.  He has otherwise no cough or mucus production.  On average get 1 nebulization daily.  Things are stable.  Last visit with pulmonary 2016, refer back to them if needed. CAD CHF A. FIB: Saw cardiology 06/08/2017, felt to be stable. Flu shot today RTC 4 months

## 2018-08-03 NOTE — Patient Instructions (Signed)
GO TO THE LAB : Get the blood work     GO TO THE FRONT DESK Schedule your next appointment for a  Check up in 4 months   

## 2018-08-03 NOTE — Progress Notes (Signed)
Pre visit review using our clinic review tool, if applicable. No additional management support is needed unless otherwise documented below in the visit note. 

## 2018-08-06 NOTE — Assessment & Plan Note (Signed)
DM Continue with Januvia, check a A1c. Chronic respiratory failure: O2 sat was 86% when he arrived to the office without oxygen supplementation, after he rested, O2 sat was 92%.  Encouraged to use his oxygen at all times.  He has otherwise no cough or mucus production.  On average get 1 nebulization daily.  Things are stable.  Last visit with pulmonary 2016, refer back to them if needed. CAD CHF A. FIB: Saw cardiology 06/08/2017, felt to be stable. Flu shot today RTC 4 months

## 2018-08-11 HISTORY — PX: CATARACT EXTRACTION W/ INTRAOCULAR LENS  IMPLANT, BILATERAL: SHX1307

## 2018-08-19 ENCOUNTER — Other Ambulatory Visit: Payer: Self-pay | Admitting: Internal Medicine

## 2018-08-21 ENCOUNTER — Telehealth: Payer: Self-pay

## 2018-08-21 MED ORDER — ZOLPIDEM TARTRATE 10 MG PO TABS: 10.0000 mg | ORAL_TABLET | Freq: Every evening | ORAL | 1 refills | Status: DC | PRN

## 2018-08-21 NOTE — Addendum Note (Signed)
Addended by: [name] E on: 08/21/2018 04:59 PM   Modules accepted: Orders

## 2018-08-21 NOTE — Telephone Encounter (Signed)
Sent!

## 2018-08-21 NOTE — Telephone Encounter (Signed)
Pt is requesting refill on Ambien 10mg .   Last OV: 08/03/2018 Last Fill: 01/10/2018 #90 and 1RF UDS: Not needed for Ambien per PCP  NCCR printed- no discrepancies noted- sent for scanning

## 2018-08-28 ENCOUNTER — Ambulatory Visit (INDEPENDENT_AMBULATORY_CARE_PROVIDER_SITE_OTHER): Payer: Medicare Other

## 2018-08-28 DIAGNOSIS — I495 Sick sinus syndrome: Secondary | ICD-10-CM | POA: Diagnosis not present

## 2018-08-28 NOTE — Progress Notes (Signed)
Remote pacemaker transmission.   

## 2018-08-31 ENCOUNTER — Encounter: Payer: Self-pay | Admitting: Cardiology

## 2018-09-06 NOTE — Progress Notes (Addendum)
Subjective:   Bradley Searles Sr. is a 77 y.o. male who presents for Medicare Annual/Subsequent preventive examination.  Pt here with wife..  Review of Systems: No ROS.  Medicare Wellness Visit. Additional risk factors are reflected in the social history.   Sleep patterns:  Takes Ambien nightly. Home Safety/Smoke Alarms: Feels safe in home. Smoke alarms in place. Lives with wife with granddaughter and great granddaughter. 3 story home. Uses chair lift. Walk-in shower.  Eye- annually. UTD per pt. Recently had cataract sx and now only using readers.  Male:   CCS- 2012-normal  PSA-  Lab Results  Component Value Date   PSA 3.60 07/15/2015   PSA 3.50 01/14/2014   PSA 1.79 12/14/2010       Objective:    Vitals: There were no vitals taken for this visit.  There is no height or weight on file to calculate BMI.  Advanced Directives 05/25/2018 05/25/2018 09/07/2017 02/21/2017 06/19/2016 02/05/2016 12/19/2015  Does Patient Have a Medical Advance Directive? Yes Yes Yes No Yes No Yes  Type of Sales promotion account executive of [employer] Power of [place];Living will - Healthcare Power of Attorney - Living will  Does patient want to make changes to medical advance directive? No - Patient declined Yes (Inpatient - patient defers changing a medical advance directive at this time) - - No - Patient declined - No - Patient declined  Copy of Healthcare Power of Attorney in Chart? No - copy requested - No - copy requested - No - copy requested - No - copy requested  Would patient like information on creating a medical advance directive? - - - No - Patient declined - No - patient declined information -  Pre-existing out of facility DNR order (yellow form or pink MOST form) - - - - - - -    Tobacco Social History   Tobacco Use  Smoking Status Former Smoker  . Packs/day: 2.50  . Years: 25.00  . Pack years: 62.50  . Types: Cigarettes  . Last attempt to quit:  10/11/1980  . Years since quitting: 37.9  Smokeless Tobacco Never Used  Tobacco Comment   used to smoke 2.5 ppd for 25 years     Counseling given: Not Answered Comment: used to smoke 2.5 ppd for 25 years   Clinical Intake:                       Past Medical History:  Diagnosis Date  . Abnormal CT scan, chest 2012   CT chest, several lymphadenopathies. Sees pulmonary  . Anemia    intermittent  . Arthritis    "?back" (08/22/2014)  . Atrial fibrillation (HCC)    s/p AV node ablation & BiV PPM implantation 09/08/11 (op dictation pending)  . BPH (benign prostatic hyperplasia)    Saw Dr [name] 2004, normal renal u/s  . Bronchitis 08/26/2017  . CAD (coronary artery disease)   . CHF (congestive heart failure) (HCC)    Thought primarily to be non-systolic although EF down (EF 16-10% 12/2010, down to 35-40% 09/05/11), cath 2008 with no CAD, nuclear study 07/2011 showing Small area of reversibility in the distal ant/lat wall the left ventricle suspicious for ischemia/septal wall HK but felt to be low risk  (per D/C Summary 07/2011)  . Chronic bronchitis (HCC)    "get it ~ q yr"  . Heart murmur   . HLD (hyperlipidemia)   . HTN (hypertension)   .  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 Insomnia   . Migraines    "very very rare"  . On home oxygen therapy    at night as needed  . Pacemaker   . Peripheral vascular disease (HCC)    ??  . Pleural effusion 2008   S/p decortication  . Pulmonary HTN (HCC)    per cath 2008  . Type II diabetes mellitus (HCC) 1999   Past Surgical History:  Procedure Laterality Date  . BI-VENTRICULAR PACEMAKER INSERTION Left 09/08/2011   Procedure: BI-VENTRICULAR PACEMAKER INSERTION (CRT-P);  Surgeon: Marinus Maw, MD;  Location: [facility] CATH LAB;  Service: Cardiovascular;  Laterality: Left;  . CARDIAC CATHETERIZATION     "couple times; never had balloon or stent" (09/27/2012)  . COLONOSCOPY  03/10/11   normal  .  EYE SURGERY  2019   cataract  . INSERT / REPLACE / REMOVE PACEMAKER  09/08/11   pacemaker placement  . LUNG DECORTICATION    . PLEURAL SCARIFICATION    . pneumothorax with fibrothorax  ~ 2010  . TONSILLECTOMY     "as a kid" (09/27/2012)   Family History  Problem Relation Age of Onset  . Diabetes Father   . Coronary artery disease Father   . Polycythemia Mother   . Drug abuse Son   . Breast cancer Maternal Aunt   . Prostate cancer Neg Hx   . Colon cancer Neg Hx    Social History   Socioeconomic History  . Marital status: Married    Spouse name: [name]  . Number of children: 1  . Years of education: Not on file  . Highest education level: Not on file  Occupational History  . Occupation: retired Publishing rights manager: RETIRED  Social Needs  . Financial resource strain: Not hard at all  . Food insecurity:    Worry: Never true    Inability: Never true  . Transportation needs:    Medical: No    Non-medical: No  Tobacco Use  . Smoking status: Former Smoker    Packs/day: 2.50    Years: 25.00    Pack years: 62.50    Types: Cigarettes    Last attempt to quit: 10/11/1980    Years since quitting: 37.9  . Smokeless tobacco: Never Used  . Tobacco comment: used to smoke 2.5 ppd for 25 years  Substance and Sexual Activity  . Alcohol use: No    Alcohol/week: 0.0 standard drinks    Comment: 08/22/2014 "maybe q couple months I'll have a beer w/dinner"  . Drug use: No  . Sexual activity: Not Currently  Lifestyle  . Physical activity:    Days per week: 6 days    Minutes per session: 20 min  . Stress: Not at all  Relationships  . Social connections:    Talks on phone: More than three times a week    Gets together: More than three times a week    Attends religious service: More than 4 times per year    Active member of club or organization: No    Attends meetings of clubs or organizations: Never    Relationship status: Married  Other Topics Concern  . Not on  file  Social History Narrative   Moved from [place] 2003.[place]   He lives w/ his wife, [name]   1 son, substance abuse, passed away 10-01-2016          Outpatient Encounter Medications as of  09/11/2018  Medication Sig  . amLODipine (NORVASC) 10 MG tablet Take 1 tablet (10 mg total) by mouth daily.  [place] aspirin 81 MG tablet Take 81 mg by mouth every morning.   [place] Kitchen atorvastatin (LIPITOR) 20 MG tablet Take 1 tablet (20 mg total) by mouth at bedtime.  [place] BESIVANCE 0.6 % SUSP Place 1 drop into the right eye 3 (three) times daily.   . bimatoprost (LUMIGAN) 0.01 % SOLN Place 1 drop into the right eye at bedtime.   . brimonidine (ALPHAGAN) 0.2 % ophthalmic solution Place 1 drop into the right eye 3 (three) times daily.   . calcium-vitamin D (OSCAL WITH D) 500-200 MG-UNIT tablet Take 1 tablet by mouth every morning.  . carvedilol (COREG) 25 MG tablet TAKE 1 TABLET TWICE A DAY WITH MEALS  . DUREZOL 0.05 % EMUL Place 1 drop into the right eye 3 (three) times daily.   [place] ipratropium-albuterol (DUONEB) 0.5-2.5 (3) MG/3ML SOLN Take 3 mLs by nebulization every 6 (six) hours as needed (for shortness of breath).  . Lancets (ONETOUCH ULTRASOFT) lancets Check blood sugars no more than twice daily  . losartan (COZAAR) 100 MG tablet Take 0.5 tablets (50 mg total) by mouth daily. (Patient taking differently: Take 50 mg by mouth at bedtime. )  . Multiple Vitamins-Minerals (MULTIVITAMINS THER. W/MINERALS) TABS tablet Take 1 tablet by mouth daily.  . nitroGLYCERIN (NITROSTAT) 0.4 MG SL tablet DISSOLVE 1 TABLET UNDER THE TONGUE EVERY 5 MINUTES AS NEEDED FOR CHEST PAIN (Patient taking differently: Place 0.4 mg under the tongue every 5 (five) minutes as needed for chest pain. )  . ONETOUCH VERIO test strip CHECK BLOOD SUGAR NO MORE THAN TWICE A DAY  . pantoprazole (PROTONIX) 40 MG tablet Take 1 tablet (40 mg total) by mouth every morning.  [place] Kitchen PROLENSA 0.07 % SOLN Place 1 drop into the right eye at bedtime.   . sitaGLIPtin (JANUVIA)  100 MG tablet Take 1 tablet (100 mg total) by mouth daily.  . tamsulosin (FLOMAX) 0.4 MG CAPS capsule Take 1 capsule (0.4 mg total) by mouth daily.  [place] Kitchen torsemide (DEMADEX) 20 MG tablet Take 3 tablets (60 mg total) by mouth 2 (two) times daily.  Marland Kitchen zolpidem (AMBIEN) 10 MG tablet Take 1 tablet (10 mg total) by mouth at bedtime as needed for sleep.   No facility-administered encounter medications on file as of 09/11/2018.     Activities of Daily Living In your present state of health, do you have any difficulty performing the following activities: 05/25/2018 09/07/2017  Hearing? N Y  Comment - left hearing aid. Pt has appointment with [facility] 09/16/17  Vision? N N  Comment - wearing glasses. [facility] exams [facility]  Difficulty concentrating or making decisions? N N  Comment - SOB w/exertion  Walking or climbing stairs? N N  Dressing or bathing? N N  Doing errands, shopping? N N  Preparing Food and eating ? - N  Using the Toilet? - N  In the past six months, have you accidently leaked urine? - N  Do you have problems with loss of bowel control? - N  Managing your Medications? - N  Managing your Finances? - N  Housekeeping or managing your Housekeeping? - N  Some recent data might be hidden    Patient Care Team: [name], MD as PCP - General (Internal Medicine) [name], MD as PCP - Cardiology (Cardiology) [name], MD as Referring Physician (Internal Medicine) Marinus Maw,  MD as Consulting Physician (Cardiology) Nyoka Cowden, MD as Consulting Physician (Pulmonary Disease) Melvenia Beam, MD as Consulting Physician (Otolaryngology) Norva Pavlov, OD (Optometry)   Assessment:   This is a routine wellness examination for [name]. Physical assessment deferred to PCP.  Exercise Activities and Dietary recommendations   Diet (meal preparation, eat out, water intake, caffeinated beverages, dairy products, fruits and vegetables): well balanced, on average, 3 meals  per day  Goals    . Maintain current health (pt-stated)       Fall Risk Fall Risk  09/07/2017 08/26/2016 03/26/2016 03/12/2015 01/14/2014  Falls in the past year? No Yes Yes No No  Number falls in past yr: - 1 1 - -  Injury with Fall? - Yes Yes - -  Follow up - Falls evaluation completed Falls evaluation completed - -    Depression Screen PHQ 2/9 Scores 05/25/2018 09/07/2017 08/26/2016 03/26/2016  PHQ - 2 Score 0 0 0 0    Cognitive Function Ad8 score reviewed for issues:  Issues making decisions:no  Less interest in hobbies / activities:no  Repeats questions, stories (family complaining):no  Trouble using ordinary gadgets (microwave, computer, phone):no  Forgets the month or year: no  Mismanaging finances: no  Remembering appts:no  Daily problems with thinking and/or memory:no Ad8 score is=0   MMSE - Mini Mental State Exam 09/07/2017  Not completed: Refused        Immunization History  Administered Date(s) Administered  . Influenza Split 10/01/2011  . Influenza Whole 08/14/2007, 07/11/2009, 09/14/2010  . Influenza, High Dose Seasonal PF 09/14/2013, 07/15/2015, 07/09/2016, 07/05/2017, 08/03/2018  . Influenza,inj,Quad PF,6+ Mos 07/05/2014  . Influenza-Unspecified 08/11/2012  . Pneumococcal Conjugate-13 03/12/2015  . Pneumococcal Polysaccharide-23 07/11/2004, 03/30/2009, 11/01/2017  . Td 03/12/2003  . Tetanus 09/14/2013  . Zoster 04/20/2011    Screening Tests Health Maintenance  Topic Date Due  . FOOT EXAM  11/01/2018  . OPHTHALMOLOGY EXAM  12/15/2018  . HEMOGLOBIN A1C  02/02/2019  . TETANUS/TDAP  09/15/2023  . INFLUENZA VACCINE  Completed  . PNA vac Low Risk Adult  Completed      Plan:    Please schedule your next medicare wellness visit with me in 1 yr.  Continue to eat heart healthy diet (full of fruits, vegetables, whole grains, lean protein, water--limit salt, fat, and sugar intake) and increase physical activity as tolerated.  Bring a copy of  your living will and/or healthcare power of attorney to your next office visit.     I have personally reviewed and noted the following in the patient's chart:   . Medical and social history . Use of alcohol, tobacco or illicit drugs  . Current medications and supplements . Functional ability and status . Nutritional status . Physical activity . Advanced directives . List of other physicians . Hospitalizations, surgeries, and ER visits in previous 12 months . Vitals . Screenings to include cognitive, depression, and falls . Referrals and appointments  In addition, I have reviewed and discussed with patient certain preventive protocols, quality metrics, and best practice recommendations. A written personalized care plan for preventive services as well as general preventive health recommendations were provided to patient.     Mady Haagensen [place], [place]  09/06/2018  [name], MD

## 2018-09-11 ENCOUNTER — Encounter: Payer: Self-pay | Admitting: *Deleted

## 2018-09-11 ENCOUNTER — Ambulatory Visit (INDEPENDENT_AMBULATORY_CARE_PROVIDER_SITE_OTHER): Payer: Medicare Other | Admitting: *Deleted

## 2018-09-11 VITALS — BP 115/60 | HR 72 | Ht 69.0 in | Wt 205.8 lb

## 2018-09-11 DIAGNOSIS — Z0000 Encounter for general adult medical examination without abnormal findings: Secondary | ICD-10-CM | POA: Diagnosis not present

## 2018-09-11 NOTE — Patient Instructions (Signed)
Please schedule your next medicare wellness visit with me in 1 yr.  Continue to eat heart healthy diet (full of fruits, vegetables, whole grains, lean protein, water--limit salt, fat, and sugar intake) and increase physical activity as tolerated.  Bring a copy of your living will and/or healthcare power of attorney to your next office visit.    Bradley [name] , Thank you for taking time to come for your Medicare Wellness Visit. I appreciate your ongoing commitment to your health goals. Please review the following plan we discussed and let me know if I can assist you in the future.   These are the goals we discussed: Goals    . Maintain current health (pt-stated)       This is a list of the screening recommended for you and due dates:  Health Maintenance  Topic Date Due  . Complete foot exam   11/01/2018  . Eye exam for diabetics  12/15/2018  . Hemoglobin A1C  02/02/2019  . Tetanus Vaccine  09/15/2023  . Flu Shot  Completed  . Pneumonia vaccines  Completed    Health Maintenance, Male A healthy lifestyle and preventive care is important for your health and wellness. Ask your health care provider about what schedule of regular examinations is right for you. What should I know about weight and diet? Eat a Healthy Diet  Eat plenty of vegetables, fruits, whole grains, low-fat dairy products, and lean protein.  Do not eat a lot of foods high in solid fats, added sugars, or salt.  Maintain a Healthy Weight Regular exercise can help you achieve or maintain a healthy weight. You should:  Do at least 150 minutes of exercise each week. The exercise should increase your heart rate and make you sweat (moderate-intensity exercise).  Do strength-training exercises at least twice a week.  Watch Your Levels of Cholesterol and Blood Lipids  Have your blood tested for lipids and cholesterol every 5 years starting at 77 years of age. If you are at high risk for heart disease, you should start  having your blood tested when you are 77 years old. You may need to have your cholesterol levels checked more often if: ? Your lipid or cholesterol levels are high. ? You are older than 77 years of age. ? You are at high risk for heart disease.  What should I know about cancer screening? Many types of cancers can be detected early and may often be prevented. Lung Cancer  You should be screened every year for lung cancer if: ? You are a current smoker who has smoked for at least 30 years. ? You are a former smoker who has quit within the past 15 years.  Talk to your health care provider about your screening options, when you should start screening, and how often you should be screened.  Colorectal Cancer  Routine colorectal cancer screening usually begins at 77 years of age and should be repeated every 5-10 years until you are 77 years old. You may need to be screened more often if early forms of precancerous polyps or small growths are found. Your health care provider may recommend screening at an earlier age if you have risk factors for colon cancer.  Your health care provider may recommend using home test kits to check for hidden blood in the stool.  A small camera at the end of a tube can be used to examine your colon (sigmoidoscopy or colonoscopy). This checks for the earliest forms of colorectal cancer.  Prostate and Testicular Cancer  Depending on your age and overall health, your health care provider may do certain tests to screen for prostate and testicular cancer.  Talk to your health care provider about any symptoms or concerns you have about testicular or prostate cancer.  Skin Cancer  Check your skin from head to toe regularly.  Tell your health care provider about any new moles or changes in moles, especially if: ? There is a change in a mole's size, shape, or color. ? You have a mole that is larger than a pencil eraser.  Always use sunscreen. Apply sunscreen  liberally and repeat throughout the day.  Protect yourself by wearing long sleeves, pants, a wide-brimmed hat, and sunglasses when outside.  What should I know about heart disease, diabetes, and high blood pressure?  If you are 8-71 years of age, have your blood pressure checked every 3-5 years. If you are 41 years of age or older, have your blood pressure checked every year. You should have your blood pressure measured twice-once when you are at a hospital or clinic, and once when you are not at a hospital or clinic. Record the average of the two measurements. To check your blood pressure when you are not at a hospital or clinic, you can use: ? An automated blood pressure machine at a pharmacy. ? A home blood pressure monitor.  Talk to your health care provider about your target blood pressure.  If you are between 4-67 years old, ask your health care provider if you should take aspirin to prevent heart disease.  Have regular diabetes screenings by checking your fasting blood sugar level. ? If you are at a normal weight and have a low risk for diabetes, have this test once every three years after the age of 44. ? If you are overweight and have a high risk for diabetes, consider being tested at a younger age or more often.  A one-time screening for abdominal aortic aneurysm (AAA) by ultrasound is recommended for men aged 65-75 years who are current or former smokers. What should I know about preventing infection? Hepatitis B If you have a higher risk for hepatitis B, you should be screened for this virus. Talk with your health care provider to find out if you are at risk for hepatitis B infection. Hepatitis C Blood testing is recommended for:  Everyone born from [birth year] through 1965.  Anyone with known risk factors for hepatitis C.  Sexually Transmitted Diseases (STDs)  You should be screened each year for STDs including gonorrhea and chlamydia if: ? You are sexually active and are  younger than 77 years of age. ? You are older than 77 years of age and your health care provider tells you that you are at risk for this type of infection. ? Your sexual activity has changed since you were last screened and you are at an increased risk for chlamydia or gonorrhea. Ask your health care provider if you are at risk.  Talk with your health care provider about whether you are at high risk of being infected with HIV. Your health care provider may recommend a prescription medicine to help prevent HIV infection.  What else can I do?  Schedule regular health, dental, and eye exams.  Stay current with your vaccines (immunizations).  Do not use any tobacco products, such as cigarettes, chewing tobacco, and e-cigarettes. If you need help quitting, ask your health care provider.  Limit alcohol intake to no more than 2  drinks per day. One drink equals 12 ounces of beer, 5 ounces of wine, or 1 ounces of hard liquor.  Do not use street drugs.  Do not share needles.  Ask your health care provider for help if you need support or information about quitting drugs.  Tell your health care provider if you often feel depressed.  Tell your health care provider if you have ever been abused or do not feel safe at home. This information is not intended to replace advice given to you by your health care provider. Make sure you discuss any questions you have with your health care provider. Document Released: 03/25/2008 Document Revised: 05/26/2016 Document Reviewed: 07/01/2015 Elsevier Interactive Patient Education  [employer].

## 2018-09-19 ENCOUNTER — Ambulatory Visit: Payer: Self-pay

## 2018-09-19 ENCOUNTER — Other Ambulatory Visit: Payer: Self-pay

## 2018-09-19 ENCOUNTER — Encounter (HOSPITAL_BASED_OUTPATIENT_CLINIC_OR_DEPARTMENT_OTHER): Payer: Self-pay

## 2018-09-19 ENCOUNTER — Inpatient Hospital Stay (HOSPITAL_BASED_OUTPATIENT_CLINIC_OR_DEPARTMENT_OTHER)
Admission: EM | Admit: 2018-09-19 | Discharge: 2018-09-25 | DRG: 291 | Disposition: A | Discharge status: Home Health | Payer: Medicare Other | Attending: Internal Medicine | Admitting: Internal Medicine

## 2018-09-19 ENCOUNTER — Emergency Department (HOSPITAL_BASED_OUTPATIENT_CLINIC_OR_DEPARTMENT_OTHER): Payer: Medicare Other

## 2018-09-19 DIAGNOSIS — G4700 Insomnia, unspecified: Secondary | ICD-10-CM | POA: Diagnosis present

## 2018-09-19 DIAGNOSIS — I428 Other cardiomyopathies: Secondary | ICD-10-CM | POA: Diagnosis present

## 2018-09-19 DIAGNOSIS — I495 Sick sinus syndrome: Secondary | ICD-10-CM | POA: Diagnosis present

## 2018-09-19 DIAGNOSIS — Z7982 Long term (current) use of aspirin: Secondary | ICD-10-CM

## 2018-09-19 DIAGNOSIS — Z803 Family history of malignant neoplasm of breast: Secondary | ICD-10-CM

## 2018-09-19 DIAGNOSIS — R5381 Other malaise: Secondary | ICD-10-CM | POA: Diagnosis present

## 2018-09-19 DIAGNOSIS — Z832 Family history of diseases of the blood and blood-forming organs and certain disorders involving the immune mechanism: Secondary | ICD-10-CM | POA: Diagnosis not present

## 2018-09-19 DIAGNOSIS — E1165 Type 2 diabetes mellitus with hyperglycemia: Secondary | ICD-10-CM | POA: Diagnosis present

## 2018-09-19 DIAGNOSIS — B9789 Other viral agents as the cause of diseases classified elsewhere: Secondary | ICD-10-CM | POA: Diagnosis present

## 2018-09-19 DIAGNOSIS — Z9981 Dependence on supplemental oxygen: Secondary | ICD-10-CM

## 2018-09-19 DIAGNOSIS — N400 Enlarged prostate without lower urinary tract symptoms: Secondary | ICD-10-CM | POA: Diagnosis present

## 2018-09-19 DIAGNOSIS — E1151 Type 2 diabetes mellitus with diabetic peripheral angiopathy without gangrene: Secondary | ICD-10-CM | POA: Diagnosis present

## 2018-09-19 DIAGNOSIS — J9621 Acute and chronic respiratory failure with hypoxia: Secondary | ICD-10-CM | POA: Diagnosis present

## 2018-09-19 DIAGNOSIS — Z881 Allergy status to other antibiotic agents status: Secondary | ICD-10-CM

## 2018-09-19 DIAGNOSIS — I2510 Atherosclerotic heart disease of native coronary artery without angina pectoris: Secondary | ICD-10-CM | POA: Diagnosis present

## 2018-09-19 DIAGNOSIS — I272 Pulmonary hypertension, unspecified: Secondary | ICD-10-CM | POA: Diagnosis not present

## 2018-09-19 DIAGNOSIS — N179 Acute kidney failure, unspecified: Secondary | ICD-10-CM | POA: Diagnosis present

## 2018-09-19 DIAGNOSIS — I5033 Acute on chronic diastolic (congestive) heart failure: Secondary | ICD-10-CM

## 2018-09-19 DIAGNOSIS — E785 Hyperlipidemia, unspecified: Secondary | ICD-10-CM | POA: Diagnosis present

## 2018-09-19 DIAGNOSIS — I110 Hypertensive heart disease with heart failure: Secondary | ICD-10-CM | POA: Diagnosis present

## 2018-09-19 DIAGNOSIS — Z885 Allergy status to narcotic agent status: Secondary | ICD-10-CM

## 2018-09-19 DIAGNOSIS — Z7984 Long term (current) use of oral hypoglycemic drugs: Secondary | ICD-10-CM

## 2018-09-19 DIAGNOSIS — I2722 Pulmonary hypertension due to left heart disease: Secondary | ICD-10-CM | POA: Diagnosis present

## 2018-09-19 DIAGNOSIS — I361 Nonrheumatic tricuspid (valve) insufficiency: Secondary | ICD-10-CM | POA: Diagnosis not present

## 2018-09-19 DIAGNOSIS — Z8249 Family history of ischemic heart disease and other diseases of the circulatory system: Secondary | ICD-10-CM

## 2018-09-19 DIAGNOSIS — Z833 Family history of diabetes mellitus: Secondary | ICD-10-CM

## 2018-09-19 DIAGNOSIS — I509 Heart failure, unspecified: Secondary | ICD-10-CM

## 2018-09-19 DIAGNOSIS — Z87891 Personal history of nicotine dependence: Secondary | ICD-10-CM | POA: Diagnosis not present

## 2018-09-19 DIAGNOSIS — Z9089 Acquired absence of other organs: Secondary | ICD-10-CM | POA: Diagnosis not present

## 2018-09-19 DIAGNOSIS — R0602 Shortness of breath: Secondary | ICD-10-CM

## 2018-09-19 DIAGNOSIS — Z950 Presence of cardiac pacemaker: Secondary | ICD-10-CM | POA: Diagnosis not present

## 2018-09-19 DIAGNOSIS — Z888 Allergy status to other drugs, medicaments and biological substances status: Secondary | ICD-10-CM

## 2018-09-19 DIAGNOSIS — I4821 Permanent atrial fibrillation: Secondary | ICD-10-CM | POA: Diagnosis present

## 2018-09-19 DIAGNOSIS — Z813 Family history of other psychoactive substance abuse and dependence: Secondary | ICD-10-CM

## 2018-09-19 DIAGNOSIS — J441 Chronic obstructive pulmonary disease with (acute) exacerbation: Secondary | ICD-10-CM | POA: Diagnosis present

## 2018-09-19 DIAGNOSIS — E1159 Type 2 diabetes mellitus with other circulatory complications: Secondary | ICD-10-CM | POA: Diagnosis not present

## 2018-09-19 DIAGNOSIS — R0902 Hypoxemia: Secondary | ICD-10-CM | POA: Diagnosis present

## 2018-09-19 DIAGNOSIS — E119 Type 2 diabetes mellitus without complications: Secondary | ICD-10-CM

## 2018-09-19 DIAGNOSIS — Z79899 Other long term (current) drug therapy: Secondary | ICD-10-CM

## 2018-09-19 LAB — CBC
HCT: 47.9 % (ref 39.0–52.0)
Hemoglobin: 15.3 g/dL (ref 13.0–17.0)
MCH: 30 pg (ref 26.0–34.0)
MCHC: 31.9 g/dL (ref 30.0–36.0)
MCV: 93.9 fL (ref 80.0–100.0)
Platelets: 204 10*3/uL (ref 150–400)
RBC: 5.1 MIL/uL (ref 4.22–5.81)
RDW: 13.2 % (ref 11.5–15.5)
WBC: 12 10*3/uL — ABNORMAL HIGH (ref 4.0–10.5)
nRBC: 0 % (ref 0.0–0.2)

## 2018-09-19 LAB — COMPREHENSIVE METABOLIC PANEL
ALT: 15 U/L (ref 0–44)
AST: 18 U/L (ref 15–41)
Albumin: 4.1 g/dL (ref 3.5–5.0)
Alkaline Phosphatase: 95 U/L (ref 38–126)
Anion gap: 6 (ref 5–15)
BUN: 12 mg/dL (ref 8–23)
CO2: 32 mmol/L (ref 22–32)
Calcium: 8.7 mg/dL — ABNORMAL LOW (ref 8.9–10.3)
Chloride: 101 mmol/L (ref 98–111)
Creatinine, Ser: 0.87 mg/dL (ref 0.61–1.24)
GFR calc Af Amer: >60 mL/min (ref >60)
GFR calc non Af Amer: >60 mL/min (ref >60)
Glucose, Bld: 161 mg/dL — ABNORMAL HIGH (ref 70–99)
Potassium: 3.9 mmol/L (ref 3.5–5.1)
Sodium: 139 mmol/L (ref 135–145)
Total Bilirubin: 1 mg/dL (ref 0.3–1.2)
Total Protein: 7.6 g/dL (ref 6.5–8.1)

## 2018-09-19 LAB — BRAIN NATRIURETIC PEPTIDE: B Natriuretic Peptide: 324.5 pg/mL — ABNORMAL HIGH (ref 0.0–100.0)

## 2018-09-19 LAB — TROPONIN I: Troponin I: <0.03 ng/mL (ref <0.03)

## 2018-09-19 MED ORDER — PANTOPRAZOLE SODIUM 40 MG PO TBEC
40.0000 mg | DELAYED_RELEASE_TABLET | Freq: Every morning | ORAL | Status: DC
  Administered 2018-09-20 – 2018-09-25 (×6): 40 mg via ORAL
  Filled 2018-09-19 (×6): qty 1

## 2018-09-19 MED ORDER — AMLODIPINE BESYLATE 10 MG PO TABS
10.0000 mg | ORAL_TABLET | Freq: Every day | ORAL | Status: DC
  Administered 2018-09-20 – 2018-09-25 (×6): 10 mg via ORAL
  Filled 2018-09-19 (×6): qty 1

## 2018-09-19 MED ORDER — ASPIRIN 81 MG PO CHEW
81.0000 mg | CHEWABLE_TABLET | Freq: Every morning | ORAL | Status: DC
  Administered 2018-09-20 – 2018-09-25 (×6): 81 mg via ORAL
  Filled 2018-09-19 (×6): qty 1

## 2018-09-19 MED ORDER — ENOXAPARIN SODIUM 40 MG/0.4ML ~~LOC~~ SOLN
40.0000 mg | SUBCUTANEOUS | Status: DC
  Administered 2018-09-19 – 2018-09-24 (×6): 40 mg via SUBCUTANEOUS
  Filled 2018-09-19 (×6): qty 0.4

## 2018-09-19 MED ORDER — IPRATROPIUM-ALBUTEROL 0.5-2.5 (3) MG/3ML IN SOLN: 3.0000 mL | Freq: Four times a day (QID) | RESPIRATORY_TRACT | Status: DC | PRN

## 2018-09-19 MED ORDER — FUROSEMIDE 10 MG/ML IJ SOLN
40.0000 mg | Freq: Once | INTRAMUSCULAR | Status: AC
  Administered 2018-09-19: 40 mg via INTRAVENOUS
  Filled 2018-09-19: qty 4

## 2018-09-19 MED ORDER — BRIMONIDINE TARTRATE 0.2 % OP SOLN
1.0000 [drp] | Freq: Three times a day (TID) | OPHTHALMIC | Status: DC
  Filled 2018-09-19: qty 5

## 2018-09-19 MED ORDER — LATANOPROST 0.005 % OP SOLN
1.0000 [drp] | Freq: Every day | OPHTHALMIC | Status: DC
  Filled 2018-09-19: qty 2.5

## 2018-09-19 MED ORDER — DIFLUPREDNATE 0.05 % OP EMUL: 1.0000 [drp] | Freq: Three times a day (TID) | OPHTHALMIC | Status: DC

## 2018-09-19 MED ORDER — SODIUM CHLORIDE 0.9% FLUSH
3.0000 mL | Freq: Two times a day (BID) | INTRAVENOUS | Status: DC
  Administered 2018-09-19 – 2018-09-25 (×12): 3 mL via INTRAVENOUS

## 2018-09-19 MED ORDER — IPRATROPIUM-ALBUTEROL 0.5-2.5 (3) MG/3ML IN SOLN
3.0000 mL | Freq: Four times a day (QID) | RESPIRATORY_TRACT | Status: DC
  Administered 2018-09-19: 3 mL via RESPIRATORY_TRACT
  Filled 2018-09-19: qty 3

## 2018-09-19 MED ORDER — IPRATROPIUM-ALBUTEROL 0.5-2.5 (3) MG/3ML IN SOLN
3.0000 mL | Freq: Once | RESPIRATORY_TRACT | Status: AC
  Administered 2018-09-19: 3 mL via RESPIRATORY_TRACT
  Filled 2018-09-19: qty 3

## 2018-09-19 MED ORDER — LOSARTAN POTASSIUM 50 MG PO TABS
50.0000 mg | ORAL_TABLET | Freq: Every day | ORAL | Status: DC
  Administered 2018-09-20 – 2018-09-25 (×6): 50 mg via ORAL
  Filled 2018-09-19 (×6): qty 1

## 2018-09-19 MED ORDER — METHYLPREDNISOLONE SODIUM SUCC 125 MG IJ SOLR
125.0000 mg | Freq: Once | INTRAMUSCULAR | Status: AC
  Administered 2018-09-19: 125 mg via INTRAVENOUS
  Filled 2018-09-19: qty 2

## 2018-09-19 MED ORDER — IPRATROPIUM-ALBUTEROL 0.5-2.5 (3) MG/3ML IN SOLN
3.0000 mL | Freq: Three times a day (TID) | RESPIRATORY_TRACT | Status: DC
  Administered 2018-09-20 (×3): 3 mL via RESPIRATORY_TRACT
  Filled 2018-09-19 (×4): qty 3

## 2018-09-19 MED ORDER — CARVEDILOL 25 MG PO TABS
25.0000 mg | ORAL_TABLET | Freq: Two times a day (BID) | ORAL | Status: DC
  Administered 2018-09-19 – 2018-09-25 (×12): 25 mg via ORAL
  Filled 2018-09-19 (×12): qty 1

## 2018-09-19 MED ORDER — FUROSEMIDE 10 MG/ML IJ SOLN
80.0000 mg | Freq: Once | INTRAMUSCULAR | Status: AC
  Administered 2018-09-19: 80 mg via INTRAVENOUS
  Filled 2018-09-19: qty 8

## 2018-09-19 MED ORDER — FUROSEMIDE 10 MG/ML IJ SOLN
40.0000 mg | Freq: Three times a day (TID) | INTRAMUSCULAR | Status: DC
  Administered 2018-09-19: 40 mg via INTRAVENOUS
  Filled 2018-09-19: qty 4

## 2018-09-19 MED ORDER — TAMSULOSIN HCL 0.4 MG PO CAPS
0.4000 mg | ORAL_CAPSULE | Freq: Every day | ORAL | Status: DC
  Administered 2018-09-20 – 2018-09-25 (×6): 0.4 mg via ORAL
  Filled 2018-09-19 (×6): qty 1

## 2018-09-19 MED ORDER — ALBUTEROL (5 MG/ML) CONTINUOUS INHALATION SOLN
10.0000 mg/h | INHALATION_SOLUTION | Freq: Once | RESPIRATORY_TRACT | Status: AC
  Administered 2018-09-19: 10 mg/h via RESPIRATORY_TRACT
  Filled 2018-09-19: qty 20

## 2018-09-19 MED ORDER — ATORVASTATIN CALCIUM 10 MG PO TABS
20.0000 mg | ORAL_TABLET | Freq: Every day | ORAL | Status: DC
  Administered 2018-09-19 – 2018-09-24 (×6): 20 mg via ORAL
  Filled 2018-09-19 (×6): qty 2

## 2018-09-19 NOTE — ED Triage Notes (Signed)
C/o CP, SOB started yesterday-pt states he wears O2 prn-states he has been wearing O2 however he did not wear to ED

## 2018-09-19 NOTE — H&P (Signed)
History and Physical   Bradley [name] ZOX:[id number] DOB: 1941/01/18 DOA: 09/19/2018  PCP: [name], MD  Chief Complaint: shortness of breath  HPI: This is a 77 year old man with medical problems including diastolic heart failure, history of atrial fibrillation status post AV nodal ablation procedure with biventricular pacer placement no longer on anticoagulation, chronic hypoxic respiratory failure with a home 2 L O2 requirement, hypertension, non-insulin-dependent diabetes presenting with shortness of breath.  History is obtained via patient report as well as electronic medical record review.  Former smoker.  At baseline, he is independent in his ADLs and IADLs, walks without assistive devices.  Reports evening prior to admission multiple episodes of paroxysmal nocturnal dyspnea.  Improved with sitting up and leaning over to the side of the bed.  Initial episode associated with chest pain, however most recent ones were not.  Shortness of breath became worse between 7 and 8 AM.  Therefore, sought medical attention.  He denies any lower extremity edema, increasing dyspnea on exertion, recent dietary indiscretion.  He reports taking a diuretic once daily, however he does not know the name or the dose of the medication.  He reports being on anticoagulation the past, but has not been on anticoagulation for over a year.  Uses 2 L of oxygen with exertion.  Associated symptoms with the PND include palpitations.  He denies fevers, chills, lightheadedness, nausea, vomiting, dysuria.  ED Course: In the emergency department vital signs remarkable for normal heart rate, respiratory rate elevated 25, emergency medicine team relayed that his O2 sats were in the 70s prior to application of supplemental O2.  Diagnostics included chest x-ray reviewed which revealed mild heart failure with interstitial edema.  EKG personally reviewed revealed ventricular paced rhythm.  CBC with white count of 12,000.  BMP  remarkable for glucose of 161.  BNP of 324.  Troponin within normal limits.  Prior to admission he was given a total of 80 mg of IV Lasix, as well as methylprednisolone IV.  Hospital medicine consulted for further management.  Review of Systems: A complete ROS was obtained; pertinent positives negatives are denoted in the HPI. Otherwise, all systems are negative.   Past Medical History:  Diagnosis Date  . Abnormal CT scan, chest 2012   CT chest, several lymphadenopathies. Sees pulmonary  . Anemia    intermittent  . Arthritis    "?back" (08/22/2014)  . Atrial fibrillation (HCC)    s/p AV node ablation & BiV PPM implantation 09/08/11 (op dictation pending)  . BPH (benign prostatic hyperplasia)    Saw Dr [name] 2004, normal renal u/s  . Bronchitis 08/26/2017  . CAD (coronary artery disease)   . CHF (congestive heart failure) (HCC)    Thought primarily to be non-systolic although EF down (EF 81-19% 12/2010, down to 35-40% 09/05/11), cath 2008 with no CAD, nuclear study 07/2011 showing Small area of reversibility in the distal ant/lat wall the left ventricle suspicious for ischemia/septal wall HK but felt to be low risk  (per D/C Summary 07/2011)  . Chronic bronchitis (HCC)    "get it ~ q yr"  . Heart murmur   . HLD (hyperlipidemia)   . HTN (hypertension)   . 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 Insomnia   . Migraines    "very very rare"  . On home oxygen therapy    at night as needed  . Pacemaker   . Peripheral vascular disease (HCC)    ??  .  Pleural effusion 2008   S/p decortication  . Pulmonary HTN (HCC)    per cath 2008  . Type II diabetes mellitus (HCC) 1999   Social History   Socioeconomic History  . Marital status: Married    Spouse name: [name]  . Number of children: 1  . Years of education: Not on file  . Highest education level: Not on file  Occupational History  . Occupation: retired Publishing rights manager: RETIRED    Social Needs  . Financial resource strain: Not hard at all  . Food insecurity:    Worry: Never true    Inability: Never true  . Transportation needs:    Medical: No    Non-medical: No  Tobacco Use  . Smoking status: Former Smoker    Packs/day: 2.50    Years: 25.00    Pack years: 62.50    Types: Cigarettes    Last attempt to quit: 10/11/1980    Years since quitting: 37.9  . Smokeless tobacco: Never Used  . Tobacco comment: used to smoke 2.5 ppd for 25 years  Substance and Sexual Activity  . Alcohol use: Not Currently    Alcohol/week: 0.0 standard drinks  . Drug use: No  . Sexual activity: Not Currently  Lifestyle  . Physical activity:    Days per week: 6 days    Minutes per session: 20 min  . Stress: Not at all  Relationships  . Social connections:    Talks on phone: More than three times a week    Gets together: More than three times a week    Attends religious service: More than 4 times per year    Active member of club or organization: No    Attends meetings of clubs or organizations: Never    Relationship status: Married  . Intimate partner violence:    Fear of current or ex partner: Not on file    Emotionally abused: Not on file    Physically abused: Not on file    Forced sexual activity: Not on file  Other Topics Concern  . Not on file  Social History Narrative   Moved from [place] 2003.[place]   He lives [place]/ his wife, [name]   1 son, substance abuse, passed away 2016/09/23         Family History  Problem Relation Age of Onset  . Diabetes Father   . Coronary artery disease Father   . Polycythemia Mother   . Drug abuse Son   . Breast cancer Maternal Aunt   . Prostate cancer Neg Hx   . Colon cancer Neg Hx     Physical Exam: Vitals:   09/19/18 1530 09/19/18 1600 09/19/18 1630 09/19/18 1832  BP: 124/65 129/64 122/65 124/77  Pulse: 72 74 67 73  Resp: 20 (!) 26 (!) 25 18  Temp:    98.6 F (37 C)  TempSrc:    Oral  SpO2: (!) 89% (!) 89% (!) 88% 95%  Weight:       Height:       General: Elderly [place] American man, no acute distress, but does become dyspneic with pursed lip breathing during prolonged discussion/questions ENT: Grossly normal hearing, MMM. Cardiovascular: RRR. No M/R/G. No LE edema. Pace present left chest. Respiratory: Breathing 4 L nasal cannula O2, globally decreased breath sounds, wheezing present on forced expiration Abdomen: Soft, non-tender.  Skin: No rash or induration seen on limited exam. Musculoskeletal: Grossly normal tone BUE/BLE. Appropriate ROM.  Sits up  without assistance. Psychiatric: Grossly normal mood and affect. Neurologic: Moves all extremities in coordinated fashion.  Oriented to year-2019.  I have personally reviewed the following labs, culture data, and imaging studies.  Assessment/Plan:  #Acute on chronic hypoxic respiratory failure due to acute decompensated diastolic (preserved EF) heart failure Course: presenting symptoms of PND; found to have evidence of volume overload on CXR (mild interstitial edema, pulmonary vascular congestion), BNP elevated (324); possible cardiac wheezes upon forced expiration A/P: clinical presentation most consistent with heart failure at this point in time, no clear causative trigger.  Will continue diuresis with furosemide 80 mg IV and assess response in AM with strict I and O monitoring - goal at least 1 L negative (tentatively over first 12-18 hours); standing daily weights. Low salt diet with 1500 cc fluid restriction. Defer to day team tomorrow as to whether repeat TTE warranted (last TTE in 05/2018).  Continue supplemental O2 to keep O2 sat at 92% or greater.  Will schedule duo-nebs initially to optimize pulmonary function in event degree of bronchospasm at play (however, do not believe COPD exacerbation given no cough)  #Other problems: -HTN, elevated CV risk: continue home BB, CCB, statin, ASA, ARB -BPH: continue home tamsulosin -AF, hx of: not on AC, s/p ablation  procedure  DVT prophylaxis: Subq Lovenox Code Status: full  Disposition Plan: Anticipate D/C home when medically ready Consults called: none Admission status: admit to hospital medicine service   Laurell Roof, MD [facility] [phone]  If 7PM-7AM, please contact night-coverage [email or web address] Password [email or web address]  This document was created using the aid of voice recognition / dication software.

## 2018-09-19 NOTE — Telephone Encounter (Signed)
Noted, thx.

## 2018-09-19 NOTE — Telephone Encounter (Signed)
fyi

## 2018-09-19 NOTE — ED Notes (Signed)
Pt on monitor 

## 2018-09-19 NOTE — ED Notes (Signed)
LM with Diplomatic Services operational officer for [employer] on floor to give report.

## 2018-09-19 NOTE — Telephone Encounter (Signed)
Pt called having SOB and chest pain. He rates the pain at moderate and states it wake him from sleep at times.  He states that the symptoms started yesterday and his chest pain is in the center of his chest and radiating to his shoulder. He has felt chilled and sweaty. He is using O2. He states he does not have asthma or emphysema. He has a Visual merchandiser implant and high cholesterol and HTN. Per protocol pt will go to the ED. Pt has family to transport him.   Reason for Disposition . Pain also present in shoulder(s) or arm(s) or jaw  (Exception: pain is clearly made worse by movement)  Answer Assessment - Initial Assessment Questions 1. LOCATION: "Where does it hurt?"       middle 2. RADIATION: "Does the pain go anywhere else?" (e.g., into neck, jaw, arms, back)     Left shoulder 3. ONSET: "When did the chest pain begin?" (Minutes, hours or days)      Yesterday morning 4. PATTERN "Does the pain come and go, or has it been constant since it started?"  "Does it get worse with exertion?"      Comes and goes worse with exertion 5. DURATION: "How long does it last" (e.g., seconds, minutes, hours)     Few seconds 6. SEVERITY: "How bad is the pain?"  (e.g., Scale 1-10; mild, moderate, or severe)    - MILD (1-3): doesn't interfere with normal activities     - MODERATE (4-7): interferes with normal activities or awakens from sleep    - SEVERE (8-10): excruciating pain, unable to do any normal activities       moderate 7. CARDIAC RISK FACTORS: "Do you have any history of heart problems or risk factors for heart disease?" (e.g., prior heart attack, angina; high blood pressure, diabetes, being overweight, high cholesterol, smoking, or strong family history of heart disease)     Pacemaker cholesterol 8. PULMONARY RISK FACTORS: "Do you have any history of lung disease?"  (e.g., blood clots in lung, asthma, emphysema, birth control pills)     unsure 9. CAUSE: "What do you think is causing the chest pain?"   no 10. OTHER SYMPTOMS: "Do you have any other symptoms?" (e.g., dizziness, nausea, vomiting, sweating, fever, difficulty breathing, cough)       Difficulty breathing 11. PREGNANCY: "Is there any chance you are pregnant?" "When was your last menstrual period?"       N/A  Protocols used: CHEST PAIN-A-AH

## 2018-09-19 NOTE — Progress Notes (Signed)
Patient arrived from [facility]. VS WNL. Patient is alert and oriented x4. Currently on [place] at 3L. Patient is resting in bed. Will continue to monitor.

## 2018-09-19 NOTE — Progress Notes (Signed)
77 year old male with history of COPD and congestive heart failure presenting with hypoxemia status post Lasix, IV Solu-Medrol and neb treatments in the emergency room.  Accepted to telemetry due to history of congestive heart failure.  Patient still needs 3 L nasal cannula with a baseline of 2 L/min by nasal cannula

## 2018-09-19 NOTE — ED Notes (Signed)
Pt was placed on O2 4 LNC by RT while in triage-to tx room via w/c

## 2018-09-19 NOTE — ED Notes (Addendum)
Pt states he has been sob and having chest pains since yesterday after waking. He states that he was not able to sleep last night, and that when he lies down in exacerbates the sob and the pain. Pt seems mildly anxious. Denies chest pain at this time. Pt has pacemaker. Receiving breathing treatment at time of assessment.

## 2018-09-19 NOTE — ED Notes (Signed)
Pt denies pain at time of assessment by this RN.

## 2018-09-19 NOTE — ED Provider Notes (Signed)
[facility] EMERGENCY DEPARTMENT Provider Note   CSN: [id number] Arrival date & time: 09/19/18  1157     History   Chief Complaint Chief Complaint  Patient presents with  . Chest Pain  . Shortness of Breath    HPI Bradley BIRDSELL Sr. is a 77 y.o. male.  HPI Patient is a 77 year old male with a history of oxygen dependent COPD who is normally at home with 2 L.  He reports increasing and worsening shortness of breath over the past 24 hours.  States he was unable to sleep and lying down exacerbate his shortness of breath.  No prior history of congestive heart failure although he is on diuretics daily.  He reports compliance with his diuretics.  He is tried his bronchodilators at home without improvement in symptoms.  Hypoxic on arrival to the emergency department.  No fevers or chills.  Denies productive cough.  No unilateral leg swelling.  No new edema in his legs.  Past Medical History:  Diagnosis Date  . Abnormal CT scan, chest 2012   CT chest, several lymphadenopathies. Sees pulmonary  . Anemia    intermittent  . Arthritis    "?back" (08/22/2014)  . Atrial fibrillation (HCC)    s/p AV node ablation & BiV PPM implantation 09/08/11 (op dictation pending)  . BPH (benign prostatic hyperplasia)    Saw Dr [name] 2004, normal renal u/s  . Bronchitis 08/26/2017  . CAD (coronary artery disease)   . CHF (congestive heart failure) (HCC)    Thought primarily to be non-systolic although EF down (EF 40-98% 12/2010, down to 35-40% 09/05/11), cath 2008 with no CAD, nuclear study 07/2011 showing Small area of reversibility in the distal ant/lat wall the left ventricle suspicious for ischemia/septal wall HK but felt to be low risk  (per D/C Summary 07/2011)  . Chronic bronchitis (HCC)    "get it ~ q yr"  . Heart murmur   . HLD (hyperlipidemia)   . HTN (hypertension)   . 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 Insomnia   . Migraines    "very very  rare"  . On home oxygen therapy    at night as needed  . Pacemaker   . Peripheral vascular disease (HCC)    ??  . Pleural effusion 2008   S/p decortication  . Pulmonary HTN (HCC)    per cath 2008  . Type II diabetes mellitus (HCC) 1999    Patient Active Problem List   Diagnosis Date Noted  . 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 Heart failure (HCC) 02/21/2017  . Left knee injury 02/10/2016  . Right wrist sprain 02/10/2016  . Acute on chronic respiratory failure with hypoxia (HCC) 12/19/2015  . PCP NOTES >>> 07/15/2015  . COPD exacerbation (HCC) 03/12/2015  . Acute bronchitis vs Upper Airway cough syndrome 09/07/2014  . CAD (coronary artery disease) of artery bypass graft 09/14/2013  . Acute on chronic systolic and diastolic heart failure, NYHA class 3 (HCC) 09/27/2012  . Annual physical exam 05/08/2012  . 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 Lymphadenopathy 09/23/2011  . Sinoatrial node dysfunction (HCC)   . 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 Systolic and diastolic CHF, acute (HCC) 09/04/2011  . Chronic diastolic heart failure (HCC)   . PAD (peripheral artery disease) (HCC) 10/22/2010  . DERMATITIS, STASIS 09/14/2010  . Chronic respiratory failure (HCC) 09/14/2010  . Chronic pulmonary hypertension secondary to elevated L H pressures  04/15/2009  .  ERECTILE DYSFUNCTION 01/06/2009  . Hyperlipidemia 12/13/2007  . Essential hypertension 12/13/2007  . Anxiety--insomnia 08/14/2007  . DM II (diabetes mellitus, type II), controlled (HCC) 01/02/2007  . Permanent atrial fibrillation 01/02/2007  . Benign enlargement of prostate 01/02/2007    Past Surgical History:  Procedure Laterality Date  . BI-VENTRICULAR PACEMAKER INSERTION Left 09/08/2011   Procedure: BI-VENTRICULAR PACEMAKER INSERTION (CRT-P);  Surgeon: Marinus Maw, MD;  Location: [facility] CATH LAB;  Service: Cardiovascular;  Laterality: Left;  . CARDIAC CATHETERIZATION     "couple  times; never had balloon or stent" (09/27/2012)  . COLONOSCOPY  03/10/11   normal  . EYE SURGERY  2019   cataract  . INSERT / REPLACE / REMOVE PACEMAKER  09/08/11   pacemaker placement  . LUNG DECORTICATION    . PLEURAL SCARIFICATION    . pneumothorax with fibrothorax  ~ 2010  . TONSILLECTOMY     "as a kid" (09/27/2012)        Home Medications    Prior to Admission medications   Medication Sig Start Date End Date Taking? Authorizing Provider  amLODipine (NORVASC) 10 MG tablet Take 1 tablet (10 mg total) by mouth daily. 05/01/18   [name], MD  aspirin 81 MG tablet Take 81 mg by mouth every morning.     [provider]  atorvastatin (LIPITOR) 20 MG tablet Take 1 tablet (20 mg total) by mouth at bedtime. 05/01/18   [name], MD  BESIVANCE 0.6 % SUSP Place 1 drop into the right eye 3 (three) times daily.  05/23/18   [provider]  bimatoprost (LUMIGAN) 0.01 % SOLN Place 1 drop into the right eye at bedtime.     [provider]  brimonidine (ALPHAGAN) 0.2 % ophthalmic solution Place 1 drop into the right eye 3 (three) times daily.  05/23/18   [provider]  calcium-vitamin D (OSCAL WITH D) 500-200 MG-UNIT tablet Take 1 tablet by mouth every morning.    [provider]  carvedilol (COREG) 25 MG tablet TAKE 1 TABLET TWICE A DAY WITH MEALS 06/06/18   Paz, Nolon Rod, MD  DUREZOL 0.05 % EMUL Place 1 drop into the right eye 3 (three) times daily.  05/23/18   [provider]  ipratropium-albuterol (DUONEB) 0.5-2.5 (3) MG/3ML SOLN Take 3 mLs by nebulization every 6 (six) hours as needed (for shortness of breath). 05/28/18 08/03/18  Arrien, York Ram, MD  Lancets [facility] ULTRASOFT) lancets Check blood sugars no more than twice daily 03/08/17   [name], MD  losartan (COZAAR) 100 MG tablet Take 0.5 tablets (50 mg total) by mouth daily. Patient taking differently: Take 50 mg by mouth at bedtime.  04/24/18   [name], MD  Multiple  Vitamins-Minerals (MULTIVITAMINS THER. W/MINERALS) TABS tablet Take 1 tablet by mouth daily. 09/14/13   [name], MD  nitroGLYCERIN (NITROSTAT) 0.4 MG SL tablet DISSOLVE 1 TABLET UNDER THE TONGUE EVERY 5 MINUTES AS NEEDED FOR CHEST PAIN Patient not taking: No sig reported 07/14/16   Marinus Maw, MD  [facility] VERIO test strip CHECK BLOOD SUGAR NO MORE THAN TWICE A DAY 03/08/18   [name], MD  pantoprazole (PROTONIX) 40 MG tablet Take 1 tablet (40 mg total) by mouth every morning. 11/11/17   [name], MD  PROLENSA 0.07 % SOLN Place 1 drop into the right eye at bedtime.  05/23/18   [provider]  sitaGLIPtin (JANUVIA) 100 MG tablet Take 1 tablet (100  mg total) by mouth daily. 05/29/18   [name], MD  tamsulosin (FLOMAX) 0.4 MG CAPS capsule Take 1 capsule (0.4 mg total) by mouth daily. 06/19/18   [name], MD  torsemide (DEMADEX) 20 MG tablet Take 3 tablets (60 mg total) by mouth 2 (two) times daily. 08/21/18   [name], MD  zolpidem (AMBIEN) 10 MG tablet Take 1 tablet (10 mg total) by mouth at bedtime as needed for sleep. 08/21/18   [name], MD    Family History Family History  Problem Relation Age of Onset  . Diabetes Father   . Coronary artery disease Father   . Polycythemia Mother   . Drug abuse Son   . Breast cancer Maternal Aunt   . Prostate cancer Neg Hx   . Colon cancer Neg Hx     Social History Social History   Tobacco Use  . Smoking status: Former Smoker    Packs/day: 2.50    Years: 25.00    Pack years: 62.50    Types: Cigarettes    Last attempt to quit: 10/11/1980    Years since quitting: 37.9  . Smokeless tobacco: Never Used  . Tobacco comment: used to smoke 2.5 ppd for 25 years  Substance Use Topics  . Alcohol use: Not Currently    Alcohol/week: 0.0 standard drinks  . Drug use: No     Allergies   Hydrocodone; Tramadol; Tadalafil; and Avelox [moxifloxacin hydrochloride]   Review of Systems Review of Systems  All other systems  reviewed and are negative.    Physical Exam Updated Vital Signs BP 126/61   Pulse 73   Temp 98.6 F (37 C) (Oral)   Resp (!) 31   Ht 5\' 9"  (1.753 m)   Wt 93.4 kg   SpO2 (!) 88%   BMI 30.41 kg/m   Physical Exam  Constitutional: He is oriented to person, place, and time. He appears well-developed.  Pursed lip breathing   HENT:  Head: Normocephalic and atraumatic.  Eyes: EOM are normal.  Neck: Normal range of motion.  Cardiovascular: Regular rhythm, normal heart sounds and intact distal pulses.  Tachycardia  Pulmonary/Chest: No stridor. No respiratory distress. He has wheezes.  Tachypnea.  Mild accessory muscle use.  Abdominal: Soft. He exhibits no distension. There is no tenderness.  Musculoskeletal: Normal range of motion. He exhibits no edema or tenderness.  Neurological: He is alert and oriented to person, place, and time.  Skin: Skin is warm and dry.  Psychiatric: He has a normal mood and affect. Judgment normal.  Nursing note and vitals reviewed.    ED Treatments / Results  Labs (all labs ordered are listed, but only abnormal results are displayed) Labs Reviewed  CBC - Abnormal; Notable for the following components:      Result Value   WBC 12.0 (*)    All other components within normal limits  COMPREHENSIVE METABOLIC PANEL - Abnormal; Notable for the following components:   Glucose, Bld 161 (*)    Calcium 8.7 (*)    All other components within normal limits  BRAIN NATRIURETIC PEPTIDE - Abnormal; Notable for the following components:   B Natriuretic Peptide 324.5 (*)    All other components within normal limits  TROPONIN I    EKG EKG Interpretation  Date/Time:  Tuesday September 19 2018 12:04:53 EST Ventricular Rate:  82 PR Interval:    QRS Duration: 134 QT Interval:  396 QTC Calculation: 462 R Axis:   -112 Text  Interpretation:  VENTRICULAR PACED RHYTHM Confirmed by Azalia Bilis ([id number]) on 09/19/2018 12:44:04 PM   Radiology Dg Chest 2  View  Result Date: 09/19/2018 CLINICAL DATA:  Short of breath chest pain EXAM: CHEST - 2 VIEW COMPARISON:  05/26/2018 FINDINGS: Dual lead pacemaker unchanged. Heart size within normal limits. Atherosclerotic aortic arch Pulmonary vascular congestion is present with small pleural effusions. Probable mild interstitial edema. No focal consolidation. IMPRESSION: Mild heart failure with small effusions and mild interstitial edema. Electronically Signed   By: Marlan Palau M.D.   On: 09/19/2018 13:08    Procedures .Critical Care Performed by: Azalia Bilis, MD Authorized by: Azalia Bilis, MD     CRITICAL CARE Performed by: Azalia Bilis Total critical care time: 32 minutes Critical care time was exclusive of separately billable procedures and treating other patients. Critical care was necessary to treat or prevent imminent or life-threatening deterioration. Critical care was time spent personally by me on the following activities: development of treatment plan with patient and/or surrogate as well as nursing, discussions with consultants, evaluation of patient's response to treatment, examination of patient, obtaining history from patient or surrogate, ordering and performing treatments and interventions, ordering and review of laboratory studies, ordering and review of radiographic studies, pulse oximetry and re-evaluation of patient's condition.   Medications Ordered in ED Medications  furosemide (LASIX) injection 40 mg (has no administration in time range)  ipratropium-albuterol (DUONEB) 0.5-2.5 (3) MG/3ML nebulizer solution 3 mL (3 mLs Nebulization Given 09/19/18 1227)  albuterol (PROVENTIL,VENTOLIN) solution continuous neb (10 mg/hr Nebulization Given 09/19/18 1304)  methylPREDNISolone sodium succinate (SOLU-MEDROL) 125 mg/2 mL injection 125 mg (125 mg Intravenous Given 09/19/18 1300)  furosemide (LASIX) injection 40 mg (40 mg Intravenous Given 09/19/18 1332)     Initial Impression /  Assessment and Plan / ED Course  I have reviewed the triage vital signs and the nursing notes.  Pertinent labs & imaging results that were available during my care of the patient were reviewed by me and considered in my medical decision making (see chart for details).    Wheezing on arrival.  Steroids and bronchodilators now for possible COPD exacerbation.patient with new orthopnea.  Heart failure work-up to ensue.   3:16 PM Patient with increased work of breathing at rest however when he was stood up and walked a short distance in the emergency department his O2 sats fell to 76% on his 2 L home oxygen.  His pursed lip breathing and shortness of breath returned at that time.  Patient will be given additional IV diuretics at this time and admitted the hospital for what appears to be volume overload and likely CHF exacerbation possibly with a component of COPD as well.  Patient agreeable to hospitalization given new hypoxia on his home oxygen.  Patient placed on 3 L nasal cannula for goal O2 sats of 88 to 92%.  Final Clinical Impressions(s) / ED Diagnoses   Final diagnoses:  Acute and chronic respiratory failure with hypoxia [facility])    ED Discharge Orders    None       Azalia Bilis, MD 09/19/18 [phone]

## 2018-09-20 LAB — GLUCOSE, CAPILLARY
Glucose-Capillary: 224 mg/dL — ABNORMAL HIGH (ref 70–99)
Glucose-Capillary: 223 mg/dL — ABNORMAL HIGH (ref 70–99)
Glucose-Capillary: 187 mg/dL — ABNORMAL HIGH (ref 70–99)
Glucose-Capillary: 161 mg/dL — ABNORMAL HIGH (ref 70–99)

## 2018-09-20 LAB — BASIC METABOLIC PANEL
Anion gap: 8 (ref 5–15)
BUN: 15 mg/dL (ref 8–23)
CO2: 30 mmol/L (ref 22–32)
Calcium: 9 mg/dL (ref 8.9–10.3)
Chloride: 100 mmol/L (ref 98–111)
Creatinine, Ser: 1.23 mg/dL (ref 0.61–1.24)
GFR calc Af Amer: >60 mL/min (ref >60)
GFR calc non Af Amer: 56 mL/min — ABNORMAL LOW (ref >60)
Glucose, Bld: 322 mg/dL — ABNORMAL HIGH (ref 70–99)
Potassium: 4.3 mmol/L (ref 3.5–5.1)
Sodium: 138 mmol/L (ref 135–145)

## 2018-09-20 LAB — CBC
HCT: 42.2 % (ref 39.0–52.0)
Hemoglobin: 14 g/dL (ref 13.0–17.0)
MCH: 30 pg (ref 26.0–34.0)
MCHC: 33.2 g/dL (ref 30.0–36.0)
MCV: 90.6 fL (ref 80.0–100.0)
Platelets: 161 10*3/uL (ref 150–400)
RBC: 4.66 MIL/uL (ref 4.22–5.81)
RDW: 13.2 % (ref 11.5–15.5)
WBC: 9.9 10*3/uL (ref 4.0–10.5)
nRBC: 0 % (ref 0.0–0.2)

## 2018-09-20 LAB — TROPONIN I: Troponin I: <0.03 ng/mL (ref <0.03)

## 2018-09-20 LAB — MAGNESIUM: Magnesium: 2.2 mg/dL (ref 1.7–2.4)

## 2018-09-20 MED ORDER — INSULIN ASPART 100 UNIT/ML ~~LOC~~ SOLN
0.0000 [IU] | Freq: Three times a day (TID) | SUBCUTANEOUS | Status: DC
  Administered 2018-09-20: 2 [IU] via SUBCUTANEOUS
  Administered 2018-09-21: 5 [IU] via SUBCUTANEOUS
  Administered 2018-09-21 – 2018-09-22 (×3): 3 [IU] via SUBCUTANEOUS

## 2018-09-20 MED ORDER — FUROSEMIDE 10 MG/ML IJ SOLN
40.0000 mg | Freq: Once | INTRAMUSCULAR | Status: AC
  Administered 2018-09-20: 40 mg via INTRAVENOUS
  Filled 2018-09-20: qty 4

## 2018-09-20 MED ORDER — FUROSEMIDE 10 MG/ML IJ SOLN
40.0000 mg | Freq: Two times a day (BID) | INTRAMUSCULAR | Status: DC
  Administered 2018-09-20 – 2018-09-25 (×10): 40 mg via INTRAVENOUS
  Filled 2018-09-20 (×11): qty 4

## 2018-09-20 NOTE — Progress Notes (Signed)
   09/20/18 1148  Clinical Encounter Type  Visited With Patient  Visit Type Initial  Referral From Physician  Consult/Referral To Chaplain  Spiritual Encounters  Spiritual Needs Prayer  The chaplain responded to Pt. request for spiritual care.  The chaplain was welcomed into the room by the Pt.  The Pt easily shared stories of the places God has intersected in his life story with the chaplain.  The Pt. declined  communication with his parish in [place].  The Pt. accepted prayer from the chaplain and future spiritual care visits.

## 2018-09-20 NOTE — Progress Notes (Signed)
PROGRESS NOTE  Bradley RIEKEN Sr. SWN:[id number] DOB: 06-17-1941 DOA: 09/19/2018 PCP: [name], MD  Brief Narrative: 77 year old man PMH including diastolic CHF, oxygen dependent COPD presented with increasing shortness of breath, PND, chest pain.  Denied dietary indiscretion, lower extremity edema, worsening dyspnea on exertion.  SPO2 in the 70s prior to application of supplemental oxygen.  Chest x-ray showed mild CHF with interstitial edema.  Admitted for acute on chronic hypoxic respiratory failure secondary to acute diastolic CHF.  Assessment/Plan Acute on chronic hypoxic respiratory failure secondary to acute CHF.  Troponin negative, EKG paced.  Echocardiogram August 2019 showed LVEF 55 to 60%, mildly dilated RV with normal systolic function.  Mild pulmonary hypertension. --Seems to be improving but still short of breath and not back to baseline.  Continue diuresis.  Acute CHF presumed based on history, BNP and chest x-ray findings. --Last echo as above, given symptoms will repeat to assess for new dysfunction.  Oxygen dependent COPD, chronic hypoxic respiratory failure on 2 L --No evidence of COPD exacerbation.  Bronchodilators as needed.  Continue supplemental oxygen.  Atrial fibrillation, status post AV nodal ablation, status post biventricular pacer placement, no longer on anticoagulation --Rhythm is paced on telemetry.  Diabetes mellitus type 2 --CBG stable.  Essential hypertension --Continue calcium channel blocker, beta-blocker   DVT prophylaxis: enoxaparin Code Status: Full Family Communication: wife by telephone Disposition Plan: home, likely tomorrow if improving still     Brendia Sacks, MD  [facility] Direct contact: [phone] --Via amion app OR  --[email or web address]; password [email or web address]  7PM-7AM contact night coverage as above 09/20/2018, 1:03 PM  LOS: 1 day   Consultants:    Procedures:    Antimicrobials:    Interval  history/Subjective: Feels better, but breathing still not back to baseline.    Objective: Vitals:  Vitals:   09/20/18 0815 09/20/18 0936  BP: 120/66   Pulse: 74   Resp: 18   Temp: 98.2 F (36.8 C)   SpO2: 98% 97%    Exam:  Constitutional:  . Appears calm and comfortable Eyes:  . pupils and irises appear normal ENMT:  . grossly normal hearing  Respiratory:  . Clear anteriorly, posteriorly bibasilar crackles noted. Marland Kitchen Respiratory effort mild to moderately increased, pursed lip breathing noted. Cardiovascular:  . RRR, no m/r/g . No LE extremity edema   . Telemetry paced rhythm Skin:  . Chronic venous stasis changes bilateral lower extremities. Psychiatric:  . Mental status o Mood, affect appropriate . judgment and insight appear intact     I have personally reviewed the following:   Data: . Urine output 1300.  -2.5 L since admission. . CBG stable.  Creatinine slightly higher at 1.23.  Potassium within normal limits magnesium within normal limits.  CBC unremarkable.  Troponin was negative.  BNP modestly elevated 324 . Chest x-ray independently reviewed showed CHF.  Small right pleural effusion.  EKG independently reviewed shows a ventricularly paced rhythm  Scheduled Meds: . amLODipine  10 mg Oral Daily  . aspirin  81 mg Oral q morning - 10a  . atorvastatin  20 mg Oral QHS  . brimonidine  1 drop Right Eye TID  . carvedilol  25 mg Oral BID WC  . Difluprednate  1 drop Right Eye TID  . enoxaparin (LOVENOX) injection  40 mg Subcutaneous Q24H  . furosemide  40 mg Intravenous Once  . insulin aspart  0-9 Units Subcutaneous TID WC  . ipratropium-albuterol  3 mL Nebulization TID  . latanoprost  1  drop Right Eye QHS  . losartan  50 mg Oral Daily  . pantoprazole  40 mg Oral q morning - 10a  . sodium chloride flush  3 mL Intravenous Q12H  . tamsulosin  0.4 mg Oral Daily   Continuous Infusions:  Principal Problem:   Acute CHF (congestive heart failure) (HCC) Active  Problems:   DM II (diabetes mellitus, type II), controlled (HCC)   Chronic pulmonary hypertension secondary to elevated L H pressures    Acute on chronic respiratory failure with hypoxia (HCC)   LOS: 1 day

## 2018-09-21 ENCOUNTER — Inpatient Hospital Stay (HOSPITAL_COMMUNITY): Payer: Medicare Other

## 2018-09-21 ENCOUNTER — Other Ambulatory Visit (HOSPITAL_COMMUNITY): Payer: Medicare Other

## 2018-09-21 DIAGNOSIS — I272 Pulmonary hypertension, unspecified: Secondary | ICD-10-CM

## 2018-09-21 DIAGNOSIS — I509 Heart failure, unspecified: Secondary | ICD-10-CM

## 2018-09-21 DIAGNOSIS — J9621 Acute and chronic respiratory failure with hypoxia: Secondary | ICD-10-CM

## 2018-09-21 DIAGNOSIS — E1159 Type 2 diabetes mellitus with other circulatory complications: Secondary | ICD-10-CM

## 2018-09-21 LAB — PROCALCITONIN: Procalcitonin: <0.1 ng/mL

## 2018-09-21 LAB — RESPIRATORY PANEL BY PCR

## 2018-09-21 LAB — GLUCOSE, CAPILLARY
Glucose-Capillary: 102 mg/dL — ABNORMAL HIGH (ref 70–99)
Glucose-Capillary: 204 mg/dL — ABNORMAL HIGH (ref 70–99)
Glucose-Capillary: 254 mg/dL — ABNORMAL HIGH (ref 70–99)
Glucose-Capillary: 258 mg/dL — ABNORMAL HIGH (ref 70–99)

## 2018-09-21 LAB — BASIC METABOLIC PANEL
Anion gap: 9 (ref 5–15)
BUN: 21 mg/dL (ref 8–23)
CO2: 30 mmol/L (ref 22–32)
Calcium: 8.5 mg/dL — ABNORMAL LOW (ref 8.9–10.3)
Chloride: 101 mmol/L (ref 98–111)
Creatinine, Ser: 1.15 mg/dL (ref 0.61–1.24)
GFR calc Af Amer: >60 mL/min (ref >60)
GFR calc non Af Amer: >60 mL/min (ref >60)
Glucose, Bld: 150 mg/dL — ABNORMAL HIGH (ref 70–99)
Potassium: 4.6 mmol/L (ref 3.5–5.1)
Sodium: 140 mmol/L (ref 135–145)

## 2018-09-21 LAB — INFLUENZA PANEL BY PCR (TYPE A & B)
Influenza A By PCR: NEGATIVE
Influenza B By PCR: NEGATIVE

## 2018-09-21 MED ORDER — AZITHROMYCIN 250 MG PO TABS
250.0000 mg | ORAL_TABLET | Freq: Every day | ORAL | Status: AC
  Administered 2018-09-22 – 2018-09-25 (×4): 250 mg via ORAL
  Filled 2018-09-21 (×4): qty 1

## 2018-09-21 MED ORDER — METHYLPREDNISOLONE SODIUM SUCC 40 MG IJ SOLR
40.0000 mg | Freq: Three times a day (TID) | INTRAMUSCULAR | Status: DC
  Administered 2018-09-21 – 2018-09-25 (×13): 40 mg via INTRAVENOUS
  Filled 2018-09-21 (×13): qty 1

## 2018-09-21 MED ORDER — IPRATROPIUM-ALBUTEROL 0.5-2.5 (3) MG/3ML IN SOLN
3.0000 mL | Freq: Four times a day (QID) | RESPIRATORY_TRACT | Status: DC
  Administered 2018-09-21 – 2018-09-24 (×12): 3 mL via RESPIRATORY_TRACT
  Filled 2018-09-21 (×13): qty 3

## 2018-09-21 MED ORDER — GUAIFENESIN 100 MG/5ML PO SOLN: 5.0000 mL | ORAL | Status: DC | PRN

## 2018-09-21 MED ORDER — DM-GUAIFENESIN ER 30-600 MG PO TB12
2.0000 | ORAL_TABLET | Freq: Two times a day (BID) | ORAL | Status: DC
  Administered 2018-09-21 – 2018-09-25 (×9): 2 via ORAL
  Filled 2018-09-21 (×9): qty 2

## 2018-09-21 MED ORDER — METHYLPREDNISOLONE SODIUM SUCC 125 MG IJ SOLR: 60.0000 mg | Freq: Three times a day (TID) | INTRAMUSCULAR | Status: DC

## 2018-09-21 MED ORDER — SODIUM CHLORIDE 3 % IN NEBU
4.0000 mL | INHALATION_SOLUTION | Freq: Three times a day (TID) | RESPIRATORY_TRACT | Status: AC
  Administered 2018-09-21 – 2018-09-23 (×7): 4 mL via RESPIRATORY_TRACT
  Filled 2018-09-21 (×9): qty 4

## 2018-09-21 MED ORDER — AZITHROMYCIN 250 MG PO TABS
500.0000 mg | ORAL_TABLET | Freq: Every day | ORAL | Status: AC
  Administered 2018-09-21: 500 mg via ORAL
  Filled 2018-09-21: qty 2

## 2018-09-21 NOTE — Progress Notes (Signed)
PROGRESS NOTE  Bradley [name] Sr. ZOX:[id number] DOB: [birth year] DOA: 09/19/2018 PCP: [name], MD  HPI/Recap of past 31 hours:  77 year old man PMH including diastolic CHF, oxygen dependent COPD presented with increasing shortness of breath, PND, chest pain.  Denied dietary indiscretion, lower extremity edema, worsening dyspnea on exertion.  SPO2 in the 70s prior to application of supplemental oxygen.  Chest x-ray showed mild CHF with interstitial edema.  Admitted for acute on chronic hypoxic respiratory failure secondary to acute diastolic CHF.  09/21/2018: Patient seen and examined at his bedside.  States he is feeling poorly with audible wheezes and persistent dyspnea with minimal movement.  Added Solu-Medrol and pulmonary toilet.  Assessment/Plan: Principal Problem:   Acute CHF (congestive heart failure) (HCC) Active Problems:   DM II (diabetes mellitus, type II), controlled (HCC)   Chronic pulmonary hypertension secondary to elevated L H pressures    Acute on chronic respiratory failure with hypoxia (HCC)  Acute on chronic hypoxic respiratory failure suspect multifactorial secondary to acute on chronic diastolic CHF versus acute COPD exacerbation Not improving Positive rhinovirus from acute respiratory viral panel. Negative influenza AMB Added Z-Pak x5 days, Solu-Medrol and pulmonary toilet to treat his acute COPD exacerbation Added DuoNeb every 6 hours Added hypersaline nebs 3 times daily, Mucinex 1200 mg twice daily and chest PT (denies history of back problems.) Obtain home O2 evaluation tomorrow prior to anticipated discharge On 2 L of oxygen by nasal cannula at bedside and now requiring 5 L to maintain O2 saturation greater than 92% Wean down oxygen supplementation to baseline as tolerated Continue with continuous pulse oximetry  Acute COPD exacerbation secondary to rhinovirus infection Management as stated above Acute respiratory viral panel positive for  rhinovirus Procalcitonin negative less than 0.10  Acute on chronic diastolic CHF Continue IV Lasix 40 mg twice daily Continue to monitor urine output Continue to monitor strict I's and O's and daily weight Independently reviewed chest x-ray done during this admission which revealed small right pleural effusion with increase in pulmonary vascularity and AICD in place. Continue current cardiac medications  Paroxysmal A. fib post AICD placement Twelve-lead EKG done on 09/19/2018 reviewed independently revealed ventricular paced Rate controlled on carvedilol Not on anticoagulation  AKI, improving  Baseline creatinine 0.8 with GFR greater than 60 Presented with creatinine of 1.23 and GFR 56 Creatinine today 1.15 with GFR greater than 60 Continue to avoid nephrotoxic agent/dehydration/hypotension  Physical debility PT to assess Fall precautions  DVT prophylaxis:  Subcu Lovenox daily Code Status: Full Family Communication:  None at bedside Disposition Plan:  Home possibly tomorrow 09/22/2018 when O2 demand close to baseline of 2 L continuously.  Currently on 5 L.      Objective: Vitals:   09/21/18 0720 09/21/18 0834 09/21/18 1130 09/21/18 1446  BP: 113/63     Pulse: 71     Resp:      Temp: 98.7 F (37.1 C)     TempSrc: Oral     SpO2: 96% 92% 90% 93%  Weight:      Height:        Intake/Output Summary (Last 24 hours) at 09/21/2018 1454 Last data filed at 09/20/2018 2141 Gross per 24 hour  Intake 3 ml  Output 500 ml  Net -497 ml   Filed Weights   09/19/18 1208 09/20/18 0539  Weight: 93.4 kg 88.8 kg    Exam:  . General: 77 y.o. year-old male well developed well nourished in no acute distress.  Alert and  oriented x3. . Cardiovascular: Regular rate and rhythm with no rubs or gallops.  No thyromegaly or JVD noted.   Marland Kitchen Respiratory: Diffuse wheezes bilaterally with crackles at bases.. Good inspiratory effort. . Abdomen: Soft nontender nondistended with normal bowel  sounds x4 quadrants. . Musculoskeletal: Trace lower extremity edema. 2/4 pulses in all 4 extremities. Marland Kitchen Psychiatry: Mood is appropriate for condition and setting   Data Reviewed: CBC: Recent Labs  Lab 09/19/18 1219 09/20/18 0328  WBC 12.0* 9.9  HGB 15.3 14.0  HCT 47.9 42.2  MCV 93.9 90.6  PLT 204 161   Basic Metabolic Panel: Recent Labs  Lab 09/19/18 1219 09/20/18 0328 09/21/18 0315  NA 139 138 140  K 3.9 4.3 4.6  CL 101 100 101  CO2 32 30 30  GLUCOSE 161* 322* 150*  BUN 12 15 21   CREATININE 0.87 1.23 1.15  CALCIUM 8.7* 9.0 8.5*  MG  --  2.2  --    GFR: Estimated Creatinine Clearance: 59.3 mL/min (by C-G formula based on SCr of 1.15 mg/dL). Liver Function Tests: Recent Labs  Lab 09/19/18 1219  AST 18  ALT 15  ALKPHOS 95  BILITOT 1.0  PROT 7.6  ALBUMIN 4.1   No results for input(s): LIPASE, AMYLASE in the last 168 hours. No results for input(s): AMMONIA in the last 168 hours. Coagulation Profile: No results for input(s): INR, PROTIME in the last 168 hours. Cardiac Enzymes: Recent Labs  Lab 09/19/18 1219 09/20/18 1138  TROPONINI <0.03 <0.03   BNP (last 3 results) No results for input(s): PROBNP in the last 8760 hours. HbA1C: No results for input(s): HGBA1C in the last 72 hours. CBG: Recent Labs  Lab 09/20/18 1158 09/20/18 1633 09/20/18 2050 09/21/18 0722 09/21/18 1213  GLUCAP 223* 187* 161* 102* 204*   Lipid Profile: No results for input(s): CHOL, HDL, LDLCALC, TRIG, CHOLHDL, LDLDIRECT in the last 72 hours. Thyroid Function Tests: No results for input(s): TSH, T4TOTAL, FREET4, T3FREE, THYROIDAB in the last 72 hours. Anemia Panel: No results for input(s): VITAMINB12, FOLATE, FERRITIN, TIBC, IRON, RETICCTPCT in the last 72 hours. Urine analysis:    Component Value Date/Time   COLORURINE YELLOW 07/15/2015 1440   APPEARANCEUR CLEAR 07/15/2015 1440   LABSPEC 1.015 07/15/2015 1440   PHURINE 6.0 07/15/2015 1440   GLUCOSEU NEGATIVE 07/15/2015  1440   HGBUR TRACE-LYSED (A) 07/15/2015 1440   BILIRUBINUR NEGATIVE 07/15/2015 1440   KETONESUR TRACE (A) 07/15/2015 1440   PROTEINUR NEGATIVE 07/06/2012 0341   UROBILINOGEN 1.0 07/15/2015 1440   NITRITE NEGATIVE 07/15/2015 1440   LEUKOCYTESUR NEGATIVE 07/15/2015 1440   Sepsis Labs: @LABRCNTIP (procalcitonin:4,lacticidven:4)  ) Recent Results (from the past 240 hour(s))  Respiratory Panel by PCR     Status: Abnormal   Collection Time: 09/21/18  8:22 AM  Result Value Ref Range Status   Adenovirus NOT DETECTED NOT DETECTED Final   Coronavirus 229E NOT DETECTED NOT DETECTED Final   Coronavirus HKU1 NOT DETECTED NOT DETECTED Final   Coronavirus NL63 NOT DETECTED NOT DETECTED Final   Coronavirus OC43 NOT DETECTED NOT DETECTED Final   Metapneumovirus NOT DETECTED NOT DETECTED Final   Rhinovirus / Enterovirus DETECTED (A) NOT DETECTED Final   Influenza A NOT DETECTED NOT DETECTED Final   Influenza B NOT DETECTED NOT DETECTED Final   Parainfluenza Virus 1 NOT DETECTED NOT DETECTED Final   Parainfluenza Virus 2 NOT DETECTED NOT DETECTED Final   Parainfluenza Virus 3 NOT DETECTED NOT DETECTED Final   Parainfluenza Virus 4 NOT DETECTED NOT DETECTED  Final   Respiratory Syncytial Virus NOT DETECTED NOT DETECTED Final   Bordetella pertussis NOT DETECTED NOT DETECTED Final   Chlamydophila pneumoniae NOT DETECTED NOT DETECTED Final   Mycoplasma pneumoniae NOT DETECTED NOT DETECTED Final    Comment: Performed at [facility], [address] [place], [place] [address]      Studies: No results found.  Scheduled Meds: . amLODipine  10 mg Oral Daily  . aspirin  81 mg Oral q morning - 10a  . atorvastatin  20 mg Oral QHS  . [START ON 09/22/2018] azithromycin  250 mg Oral Daily  . carvedilol  25 mg Oral BID WC  . dextromethorphan-guaiFENesin  2 tablet Oral BID  . enoxaparin (LOVENOX) injection  40 mg Subcutaneous Q24H  . furosemide  40 mg Intravenous BID  . insulin aspart  0-9 Units  Subcutaneous TID WC  . ipratropium-albuterol  3 mL Nebulization Q6H  . latanoprost  1 drop Right Eye QHS  . losartan  50 mg Oral Daily  . methylPREDNISolone (SOLU-MEDROL) injection  40 mg Intravenous TID  . pantoprazole  40 mg Oral q morning - 10a  . sodium chloride flush  3 mL Intravenous Q12H  . sodium chloride HYPERTONIC  4 mL Nebulization TID  . tamsulosin  0.4 mg Oral Daily    Continuous Infusions:   LOS: 2 days     Darlin Drop, MD [facility] Pager [phone]  If 7PM-7AM, please contact night-coverage [email or web address] Password [email or web address] 09/21/2018, 2:54 PM

## 2018-09-22 ENCOUNTER — Other Ambulatory Visit (HOSPITAL_COMMUNITY): Payer: Medicare Other

## 2018-09-22 LAB — GLUCOSE, CAPILLARY
Glucose-Capillary: 223 mg/dL — ABNORMAL HIGH (ref 70–99)
Glucose-Capillary: 247 mg/dL — ABNORMAL HIGH (ref 70–99)
Glucose-Capillary: 225 mg/dL — ABNORMAL HIGH (ref 70–99)
Glucose-Capillary: 250 mg/dL — ABNORMAL HIGH (ref 70–99)

## 2018-09-22 MED ORDER — INSULIN ASPART 100 UNIT/ML ~~LOC~~ SOLN
0.0000 [IU] | Freq: Three times a day (TID) | SUBCUTANEOUS | Status: DC
  Administered 2018-09-22 – 2018-09-23 (×3): 5 [IU] via SUBCUTANEOUS
  Administered 2018-09-23: 3 [IU] via SUBCUTANEOUS
  Administered 2018-09-24: 8 [IU] via SUBCUTANEOUS
  Administered 2018-09-24: 5 [IU] via SUBCUTANEOUS
  Administered 2018-09-24 – 2018-09-25 (×2): 8 [IU] via SUBCUTANEOUS
  Administered 2018-09-25: 3 [IU] via SUBCUTANEOUS

## 2018-09-22 MED ORDER — INSULIN ASPART 100 UNIT/ML ~~LOC~~ SOLN
0.0000 [IU] | Freq: Every day | SUBCUTANEOUS | Status: DC
  Administered 2018-09-22 – 2018-09-23 (×2): 2 [IU] via SUBCUTANEOUS
  Administered 2018-09-24: 3 [IU] via SUBCUTANEOUS

## 2018-09-22 NOTE — Progress Notes (Signed)
  Echocardiogram 2D Echocardiogram has been attempted. Physical Therapy in progress. Patient not in room.  Bradley [name] G [name] 09/22/2018, 1:51 PM

## 2018-09-22 NOTE — Progress Notes (Signed)
PROGRESS NOTE  Bradley SPRINGBORN Sr. ZOX:[id number] DOB: 1941-07-27 DOA: 09/19/2018 PCP: [name], MD  HPI/Recap of past 56 hours:  77 year old man PMH including diastolic CHF, oxygen dependent COPD presented with increasing shortness of breath, PND, chest pain.  Denied dietary indiscretion, lower extremity edema, worsening dyspnea on exertion.  SPO2 in the 70s prior to application of supplemental oxygen.  Chest x-ray showed mild CHF with interstitial edema.  Admitted for acute on chronic hypoxic respiratory failure secondary to acute diastolic CHF.  09/21/2018: States he is feeling poorly with audible wheezes and persistent dyspnea with minimal movement.  Added Solu-Medrol and pulmonary toilet.  09/22/2018: Patient seen and examined at his bedside.  No acute events overnight.  States still wheezing with dyspnea on minimal movement.  Denies any chest pain.  Assessment/Plan: Principal Problem:   Acute CHF (congestive heart failure) (HCC) Active Problems:   DM II (diabetes mellitus, type II), controlled (HCC)   Chronic pulmonary hypertension secondary to elevated L H pressures    Acute on chronic respiratory failure with hypoxia (HCC)  Acute on chronic hypoxic respiratory failure suspect multifactorial secondary to acute on chronic diastolic CHF versus acute COPD exacerbation Still dyspneic with minimal movement and persistent audible wheezes noted Positive rhinovirus from acute respiratory viral panel. Negative influenza A&B Continue Z-Pak x5 days, Solu-Medrol and pulmonary toilet to treat his acute COPD exacerbation Continue DuoNeb every 6 hours C/w hypersaline nebs 3 times daily, Mucinex 1200 mg twice daily and chest PT (denies history of back problems.) Home O2 evaluation prior to discharge On 2 L of oxygen by nasal cannula continuously at baseline Currently on 3 L of oxygen by nasal cannula improvement from 5 L yesterday  Maintain O2 saturation greater than 92%   Type 2 diabetes  complicated by hyperglycemia Continue insulin sliding scale Increase correction insulin sliding scale to moderate from sensitive due to hyperglycemia On Januvia at home Last hemoglobin A1c 6.3 on 08/03/2018  Acute COPD exacerbation secondary to rhinovirus infection Management as stated above Acute respiratory viral panel positive for rhinovirus Procalcitonin negative less than 0.10  Acute on chronic diastolic CHF Continue IV Lasix 40 mg twice daily Continue to monitor urine output Continue to monitor strict I's and O's and daily weight Independently reviewed chest x-ray done during this admission which revealed small right pleural effusion with increase in pulmonary vascularity and AICD in place. Continue current cardiac medications  Paroxysmal A. fib post AICD placement Twelve-lead EKG done on 09/19/2018 reviewed independently revealed ventricular paced Rate controlled on carvedilol Not on anticoagulation  AKI, improving  Baseline creatinine 0.8 with GFR greater than 60 Presented with creatinine of 1.23 and GFR 56 Creatinine today 1.15 with GFR greater than 60 Continue to avoid nephrotoxic agent/dehydration/hypotension  Physical debility PT to assess Fall precautions  DVT prophylaxis:  Subcu Lovenox daily Code Status: Full Family Communication:  None at bedside Disposition Plan:  Home possibly tomorrow 09/23/2018 when O2 demand close to baseline of 2 L continuously.  Currently on 3 L.      Objective: Vitals:   09/22/18 0500 09/22/18 0727 09/22/18 0801 09/22/18 0802  BP:  140/81    Pulse:  64    Resp:      Temp:  (!) 97.4 F (36.3 C)    TempSrc:  Oral    SpO2:  95% 92% 92%  Weight: 89.4 kg     Height:        Intake/Output Summary (Last 24 hours) at 09/22/2018 1241 Last data  filed at 09/22/2018 0819 Gross per 24 hour  Intake 923 ml  Output -  Net 923 ml   Filed Weights   09/19/18 1208 09/20/18 0539 09/22/18 0500  Weight: 93.4 kg 88.8 kg 89.4 kg     Exam:  . General: 77 y.o. year-old male Wd WN appears dyspneic at rest A&O x 3 . Cardiovascular: RRR no rubs or gallops. No JVD or thyromegaly . Respiratory: Diffuse wheezes bilaterally with no rales noted.  Good inspiratory effort. . Abdomen: Soft nontender nondistended with normal bowel sounds x4 quadrants. . Musculoskeletal: Trace lower extremity edema. 2/4 pulses in all 4 extremities. Marland Kitchen Psychiatry: Mood is appropriate for condition and setting   Data Reviewed: CBC: Recent Labs  Lab 09/19/18 1219 09/20/18 0328  WBC 12.0* 9.9  HGB 15.3 14.0  HCT 47.9 42.2  MCV 93.9 90.6  PLT 204 161   Basic Metabolic Panel: Recent Labs  Lab 09/19/18 1219 09/20/18 0328 09/21/18 0315  NA 139 138 140  K 3.9 4.3 4.6  CL 101 100 101  CO2 32 30 30  GLUCOSE 161* 322* 150*  BUN 12 15 21   CREATININE 0.87 1.23 1.15  CALCIUM 8.7* 9.0 8.5*  MG  --  2.2  --    GFR: Estimated Creatinine Clearance: 59.5 mL/min (by C-G formula based on SCr of 1.15 mg/dL). Liver Function Tests: Recent Labs  Lab 09/19/18 1219  AST 18  ALT 15  ALKPHOS 95  BILITOT 1.0  PROT 7.6  ALBUMIN 4.1   No results for input(s): LIPASE, AMYLASE in the last 168 hours. No results for input(s): AMMONIA in the last 168 hours. Coagulation Profile: No results for input(s): INR, PROTIME in the last 168 hours. Cardiac Enzymes: Recent Labs  Lab 09/19/18 1219 09/20/18 1138  TROPONINI <0.03 <0.03   BNP (last 3 results) No results for input(s): PROBNP in the last 8760 hours. HbA1C: No results for input(s): HGBA1C in the last 72 hours. CBG: Recent Labs  Lab 09/21/18 1213 09/21/18 1632 09/21/18 2213 09/22/18 0724 09/22/18 1203  GLUCAP 204* 254* 258* 223* 247*   Lipid Profile: No results for input(s): CHOL, HDL, LDLCALC, TRIG, CHOLHDL, LDLDIRECT in the last 72 hours. Thyroid Function Tests: No results for input(s): TSH, T4TOTAL, FREET4, T3FREE, THYROIDAB in the last 72 hours. Anemia Panel: No results for  input(s): VITAMINB12, FOLATE, FERRITIN, TIBC, IRON, RETICCTPCT in the last 72 hours. Urine analysis:    Component Value Date/Time   COLORURINE YELLOW 07/15/2015 1440   APPEARANCEUR CLEAR 07/15/2015 1440   LABSPEC 1.015 07/15/2015 1440   PHURINE 6.0 07/15/2015 1440   GLUCOSEU NEGATIVE 07/15/2015 1440   HGBUR TRACE-LYSED (A) 07/15/2015 1440   BILIRUBINUR NEGATIVE 07/15/2015 1440   KETONESUR TRACE (A) 07/15/2015 1440   PROTEINUR NEGATIVE 07/06/2012 0341   UROBILINOGEN 1.0 07/15/2015 1440   NITRITE NEGATIVE 07/15/2015 1440   LEUKOCYTESUR NEGATIVE 07/15/2015 1440   Sepsis Labs: @LABRCNTIP (procalcitonin:4,lacticidven:4)  ) Recent Results (from the past 240 hour(s))  Respiratory Panel by PCR     Status: Abnormal   Collection Time: 09/21/18  8:22 AM  Result Value Ref Range Status   Adenovirus NOT DETECTED NOT DETECTED Final   Coronavirus 229E NOT DETECTED NOT DETECTED Final   Coronavirus HKU1 NOT DETECTED NOT DETECTED Final   Coronavirus NL63 NOT DETECTED NOT DETECTED Final   Coronavirus OC43 NOT DETECTED NOT DETECTED Final   Metapneumovirus NOT DETECTED NOT DETECTED Final   Rhinovirus / Enterovirus DETECTED (A) NOT DETECTED Final   Influenza A NOT DETECTED NOT  DETECTED Final   Influenza B NOT DETECTED NOT DETECTED Final   Parainfluenza Virus 1 NOT DETECTED NOT DETECTED Final   Parainfluenza Virus 2 NOT DETECTED NOT DETECTED Final   Parainfluenza Virus 3 NOT DETECTED NOT DETECTED Final   Parainfluenza Virus 4 NOT DETECTED NOT DETECTED Final   Respiratory Syncytial Virus NOT DETECTED NOT DETECTED Final   Bordetella pertussis NOT DETECTED NOT DETECTED Final   Chlamydophila pneumoniae NOT DETECTED NOT DETECTED Final   Mycoplasma pneumoniae NOT DETECTED NOT DETECTED Final    Comment: Performed at [facility], [address], [place], [place] [address]      Studies: No results found.  Scheduled Meds: . amLODipine  10 mg Oral Daily  . aspirin  81 mg Oral q morning - 10a    . atorvastatin  20 mg Oral QHS  . azithromycin  250 mg Oral Daily  . carvedilol  25 mg Oral BID WC  . dextromethorphan-guaiFENesin  2 tablet Oral BID  . enoxaparin (LOVENOX) injection  40 mg Subcutaneous Q24H  . furosemide  40 mg Intravenous BID  . insulin aspart  0-9 Units Subcutaneous TID WC  . ipratropium-albuterol  3 mL Nebulization Q6H  . latanoprost  1 drop Right Eye QHS  . losartan  50 mg Oral Daily  . methylPREDNISolone (SOLU-MEDROL) injection  40 mg Intravenous TID  . pantoprazole  40 mg Oral q morning - 10a  . sodium chloride flush  3 mL Intravenous Q12H  . sodium chloride HYPERTONIC  4 mL Nebulization TID  . tamsulosin  0.4 mg Oral Daily    Continuous Infusions:   LOS: 3 days     Darlin Drop, MD [facility] Pager [phone]  If 7PM-7AM, please contact night-coverage [email or web address] Password [facility] 09/22/2018, 12:41 PM

## 2018-09-22 NOTE — Evaluation (Signed)
Physical Therapy Evaluation and Discharge Patient Details Name: Bradley CYPRET Sr. MRN: [id number] DOB: [birth year] Today's Date: 09/22/2018   History of Present Illness  Pt is a 77 y.o. M with significant PMH of diastolic CHF, oxygen dependent COPD, diabetes mellitus type 2 who presents with acute on chronic hypoxic respiratory failure secondary to acute diastolic CHF.  Clinical Impression  Patient evaluated by Physical Therapy with no further acute PT needs identified. Prior to admission, patient was a limited community ambulator with no assistive device and independent with ADL's. On PT evaluation, patient ambulating 350 feet with no assistive device without difficulty. Desturation to 87% on RA, returned to 2L O2 where he maintained 90% during mobility. HR 96-97 bpm. No further acute PT needs. Pt states he is at his functional baseline. All education has been completed and the patient has no further questions. No follow-up Physical Therapy or equipment needs. PT is signing off. Thank you for this referral.     Follow Up Recommendations No PT follow up    Equipment Recommendations  None recommended by PT    Recommendations for Other Services       Precautions / Restrictions Precautions Precautions: Other (comment) Precaution Comments: watch O2 Restrictions Weight Bearing Restrictions: No      Mobility  Bed Mobility Overal bed mobility: Independent                Transfers Overall transfer level: Independent Equipment used: None                Ambulation/Gait Ambulation/Gait assistance: Modified independent (Device/Increase time) Gait Distance (Feet): 350 Feet Assistive device: None Gait Pattern/deviations: Step-through pattern;Decreased stride length;Wide base of support Gait velocity: decreased   General Gait Details: Decreased hip flexion noted during swing through phase but no overt LOB during ambulation  Stairs            Wheelchair Mobility     Modified Rankin (Stroke Patients Only)       Balance Overall balance assessment: Mild deficits observed, not formally tested                                           Pertinent Vitals/Pain Pain Assessment: No/denies pain    Home Living Family/patient expects to be discharged to:: Private residence Living Arrangements: Spouse/significant other;Other relatives(granddaughter) Available Help at Discharge: Family Type of Home: Other(Comment)(townhouse)       Home Layout: Multi-level Home Equipment: Walker - 2 wheels;Shower seat      Prior Function Level of Independence: Independent         Comments: Goes shopping with wife with portable oxygen, does daily walks to [employer]        Extremity/Trunk Assessment   Upper Extremity Assessment Upper Extremity Assessment: Overall WFL for tasks assessed    Lower Extremity Assessment Lower Extremity Assessment: Overall WFL for tasks assessed       Communication   Communication: No difficulties  Cognition Arousal/Alertness: Awake/alert Behavior During Therapy: WFL for tasks assessed/performed Overall Cognitive Status: Within Functional Limits for tasks assessed                                        General Comments      Exercises     Assessment/Plan  PT Assessment Patent does not need any further PT services  PT Problem List Decreased activity tolerance;Decreased balance;Decreased mobility;Cardiopulmonary status limiting activity       PT Treatment Interventions      PT Goals (Current goals can be found in the Care Plan section)  Acute Rehab PT Goals Patient Stated Goal: none stated; agreeable to evaluation PT Goal Formulation: All assessment and education complete, DC therapy    Frequency     Barriers to discharge        Co-evaluation               AM-PAC PT "6 Clicks" Mobility  Outcome Measure Help needed turning from your back to  your side while in a flat bed without using bedrails?: None Help needed moving from lying on your back to sitting on the side of a flat bed without using bedrails?: None Help needed moving to and from a bed to a chair (including a wheelchair)?: None Help needed standing up from a chair using your arms (e.g., wheelchair or bedside chair)?: None Help needed to walk in hospital room?: None Help needed climbing 3-5 steps with a railing? : A Little 6 Click Score: 23    End of Session Equipment Utilized During Treatment: Oxygen Activity Tolerance: Patient tolerated treatment well Patient left: in bed;with call bell/phone within reach;Other (comment)(with respiratory) Nurse Communication: Mobility status PT Visit Diagnosis: Other abnormalities of gait and mobility (R26.89);Difficulty in walking, not elsewhere classified (R26.2)    Time: 6295-2841 PT Time Calculation (min) (ACUTE ONLY): 17 min   Charges:   PT Evaluation $PT Eval Moderate Complexity: 1 Mod          Laurina Bustle, [place], DPT Acute Rehabilitation Services Pager [phone] Office [phone]  Vanetta Mulders 09/22/2018, 2:23 PM

## 2018-09-22 NOTE — Progress Notes (Signed)
SATURATION QUALIFICATIONS: (This note is used to comply with regulatory documentation for home oxygen)  Patient Saturations on Room Air at Rest = 91%  Patient Saturations on Room Air while Ambulating = 87%  Patient Saturations on 2 Liters of oxygen while Ambulating = 90%  Please briefly explain why patient needs home oxygen: To maintain oxygen saturations > or equal to 90%  Laurina Bustle, PT, DPT Acute Rehabilitation Services Pager [phone] Office [phone]

## 2018-09-22 NOTE — Care Management Important Message (Signed)
Important Message  Patient Details  Name: Bradley ZELDIN Sr. MRN: [id number] Date of Birth: 11/14/1940   Medicare Important Message Given:  Yes    [name] 09/22/2018, 4:08 PM

## 2018-09-23 ENCOUNTER — Inpatient Hospital Stay (HOSPITAL_COMMUNITY): Payer: Medicare Other

## 2018-09-23 DIAGNOSIS — I361 Nonrheumatic tricuspid (valve) insufficiency: Secondary | ICD-10-CM

## 2018-09-23 LAB — CBC WITH DIFFERENTIAL/PLATELET
Abs Immature Granulocytes: 0.09 10*3/uL — ABNORMAL HIGH (ref 0.00–0.07)
Basophils Absolute: 0 10*3/uL (ref 0.0–0.1)
Basophils Relative: 0 %
Eosinophils Absolute: 0 10*3/uL (ref 0.0–0.5)
Eosinophils Relative: 0 %
HCT: 39.7 % (ref 39.0–52.0)
Hemoglobin: 13.1 g/dL (ref 13.0–17.0)
Immature Granulocytes: 1 %
Lymphocytes Relative: 13 %
Lymphs Abs: 0.9 10*3/uL (ref 0.7–4.0)
MCH: 29.8 pg (ref 26.0–34.0)
MCHC: 33 g/dL (ref 30.0–36.0)
MCV: 90.4 fL (ref 80.0–100.0)
Monocytes Absolute: 0.4 10*3/uL (ref 0.1–1.0)
Monocytes Relative: 5 %
Neutro Abs: 5.8 10*3/uL (ref 1.7–7.7)
Neutrophils Relative %: 81 %
Platelets: 178 10*3/uL (ref 150–400)
RBC: 4.39 MIL/uL (ref 4.22–5.81)
RDW: 12.7 % (ref 11.5–15.5)
WBC: 7.2 10*3/uL (ref 4.0–10.5)
nRBC: 0 % (ref 0.0–0.2)

## 2018-09-23 LAB — BASIC METABOLIC PANEL
Anion gap: 8 (ref 5–15)
BUN: 21 mg/dL (ref 8–23)
CO2: 32 mmol/L (ref 22–32)
Calcium: 8.6 mg/dL — ABNORMAL LOW (ref 8.9–10.3)
Chloride: 98 mmol/L (ref 98–111)
Creatinine, Ser: 0.94 mg/dL (ref 0.61–1.24)
GFR calc Af Amer: >60 mL/min (ref >60)
GFR calc non Af Amer: >60 mL/min (ref >60)
Glucose, Bld: 254 mg/dL — ABNORMAL HIGH (ref 70–99)
Potassium: 4.4 mmol/L (ref 3.5–5.1)
Sodium: 138 mmol/L (ref 135–145)

## 2018-09-23 LAB — GLUCOSE, CAPILLARY
Glucose-Capillary: 194 mg/dL — ABNORMAL HIGH (ref 70–99)
Glucose-Capillary: 248 mg/dL — ABNORMAL HIGH (ref 70–99)
Glucose-Capillary: 239 mg/dL — ABNORMAL HIGH (ref 70–99)
Glucose-Capillary: 220 mg/dL — ABNORMAL HIGH (ref 70–99)

## 2018-09-23 LAB — MAGNESIUM: Magnesium: 2.5 mg/dL — ABNORMAL HIGH (ref 1.7–2.4)

## 2018-09-23 LAB — ECHOCARDIOGRAM COMPLETE
Height: 69 in
Weight: 3161.6 oz

## 2018-09-23 LAB — PHOSPHORUS: Phosphorus: 3.3 mg/dL (ref 2.5–4.6)

## 2018-09-23 MED ORDER — IBUPROFEN 600 MG PO TABS
600.0000 mg | ORAL_TABLET | Freq: Once | ORAL | Status: AC
  Administered 2018-09-23: 600 mg via ORAL
  Filled 2018-09-23 (×2): qty 1

## 2018-09-23 MED ORDER — IPRATROPIUM-ALBUTEROL 0.5-2.5 (3) MG/3ML IN SOLN
3.0000 mL | RESPIRATORY_TRACT | Status: DC | PRN
  Administered 2018-09-24: 3 mL via RESPIRATORY_TRACT
  Filled 2018-09-23: qty 3

## 2018-09-23 NOTE — Progress Notes (Signed)
PROGRESS NOTE  Bradley BOESE Sr. ZOX:[id number] DOB: [birth year] DOA: 09/19/2018 PCP: [name], MD  HPI/Recap of past 9 hours:  77 year old man PMH including diastolic CHF, oxygen dependent COPD presented with increasing shortness of breath, PND, chest pain.  Denied dietary indiscretion, lower extremity edema, worsening dyspnea on exertion.  SPO2 in the 70s prior to application of supplemental oxygen.  Chest x-ray showed mild CHF with interstitial edema.  Admitted for acute on chronic hypoxic respiratory failure secondary to acute diastolic CHF.  09/21/2018: States he is feeling poorly with audible wheezes and persistent dyspnea with minimal movement.  Added Solu-Medrol and pulmonary toilet.  09/22/2018: Patient seen and examined at his bedside.  No acute events overnight.  States still wheezing with dyspnea on minimal movement.  Denies any chest pain.  09/23/2018: Patient seen and examined at his bedside.  No acute events overnight.  Still cough and mild wheezing with movements.  Assessment/Plan: Principal Problem:   Acute CHF (congestive heart failure) (HCC) Active Problems:   DM II (diabetes mellitus, type II), controlled (HCC)   Chronic pulmonary hypertension secondary to elevated L H pressures    Acute on chronic respiratory failure with hypoxia (HCC)  Acute on chronic hypoxic respiratory failure suspect multifactorial secondary to acute on chronic diastolic CHF versus acute COPD exacerbation Still dyspneic with minimal movement and persistent audible wheezes noted Positive rhinovirus from acute respiratory viral panel. Negative influenza A&B Continue Z-Pak x5 days Wean off Solu-Medrol from 40 mg 3 times daily to 40 twice daily Continue pulmonary toilet Continue duo nebs  Maintain O2 saturation greater than 92% On 2 L of oxygen by nasal cannula at baseline.   Type 2 diabetes complicated by hyperglycemia, improving Continue insulin sliding scale Increase correction insulin  sliding scale to moderate from sensitive due to hyperglycemia On Januvia at home Last hemoglobin A1c 6.3 on 08/03/2018  Acute COPD exacerbation secondary to rhinovirus infection Management as stated above Acute respiratory viral panel positive for rhinovirus Procalcitonin negative less than 0.10  Acute on chronic diastolic CHF Continue IV Lasix 40 mg twice daily Continue to monitor urine output Continue to monitor strict I's and O's and daily weight Independently reviewed chest x-ray done during this admission which revealed small right pleural effusion with increase in pulmonary vascularity and AICD in place. Continue current cardiac medications  Paroxysmal A. fib post AICD placement Twelve-lead EKG done on 09/19/2018 reviewed independently revealed ventricular paced Rate controlled on carvedilol Not on anticoagulation  AKI, resolving Baseline creatinine 0.8 with GFR greater than 60 Presented with creatinine of 1.23 and GFR 56 Creatinine is back to his baseline at 0.8 Continue to avoid nephrotoxic agents/dehydration/hypotension  Physical debility Continue physical therapy Fall precautions  DVT prophylaxis:  Subcu Lovenox daily Code Status: Full Family Communication:  None at bedside Disposition Plan:  Home tomorrow 09/24/18 if no acute events overnight.  Patient requires more diuresis.  On IV Lasix 40 mg twice daily   Objective: Vitals:   09/23/18 0600 09/23/18 0812 09/23/18 0812 09/23/18 0849  BP:  129/76 129/76   Pulse:  70 70   Resp:  13 13   Temp:  (!) 97.5 F (36.4 C) (!) 97.5 F (36.4 C)   TempSrc:  Oral Oral   SpO2:  97% 97% 95%  Weight: 89.6 kg     Height:        Intake/Output Summary (Last 24 hours) at 09/23/2018 1048 Last data filed at 09/23/2018 0600 Gross per 24 hour  Intake 720  ml  Output 2100 ml  Net -1380 ml   Filed Weights   09/20/18 0539 09/22/18 0500 09/23/18 0600  Weight: 88.8 kg 89.4 kg 89.6 kg    Exam:  . General: 77 y.o.  year-old male well-developed well-nourished.  Appears mildly dyspneic with movement.  Alert and oriented x3. . Cardiovascular: Regular rate and rhythm with no rubs or gallops.  No JVD or thyromegaly noted. Marland Kitchen Respiratory: Mild wheezes bilaterally and rales noted at bases.  Good inspiratory effort. . Abdomen: Soft nontender nondistended with normal bowel sounds x4 quadrants. . Musculoskeletal: Trace lower extremity edema. 2/4 pulses in all 4 extremities. Marland Kitchen Psychiatry: Mood is appropriate for condition and setting   Data Reviewed: CBC: Recent Labs  Lab 09/19/18 1219 09/20/18 0328 09/23/18 0301  WBC 12.0* 9.9 7.2  NEUTROABS  --   --  5.8  HGB 15.3 14.0 13.1  HCT 47.9 42.2 39.7  MCV 93.9 90.6 90.4  PLT 204 161 178   Basic Metabolic Panel: Recent Labs  Lab 09/19/18 1219 09/20/18 0328 09/21/18 0315 09/23/18 0301  NA 139 138 140 138  K 3.9 4.3 4.6 4.4  CL 101 100 101 98  CO2 32 30 30 32  GLUCOSE 161* 322* 150* 254*  BUN 12 15 21 21   CREATININE 0.87 1.23 1.15 0.94  CALCIUM 8.7* 9.0 8.5* 8.6*  MG  --  2.2  --  2.5*  PHOS  --   --   --  3.3   GFR: Estimated Creatinine Clearance: 72.9 mL/min (by C-G formula based on SCr of 0.94 mg/dL). Liver Function Tests: Recent Labs  Lab 09/19/18 1219  AST 18  ALT 15  ALKPHOS 95  BILITOT 1.0  PROT 7.6  ALBUMIN 4.1   No results for input(s): LIPASE, AMYLASE in the last 168 hours. No results for input(s): AMMONIA in the last 168 hours. Coagulation Profile: No results for input(s): INR, PROTIME in the last 168 hours. Cardiac Enzymes: Recent Labs  Lab 09/19/18 1219 09/20/18 1138  TROPONINI <0.03 <0.03   BNP (last 3 results) No results for input(s): PROBNP in the last 8760 hours. HbA1C: No results for input(s): HGBA1C in the last 72 hours. CBG: Recent Labs  Lab 09/22/18 0724 09/22/18 1203 09/22/18 1641 09/22/18 2138 09/23/18 0808  GLUCAP 223* 247* 225* 250* 194*   Lipid Profile: No results for input(s): CHOL, HDL,  LDLCALC, TRIG, CHOLHDL, LDLDIRECT in the last 72 hours. Thyroid Function Tests: No results for input(s): TSH, T4TOTAL, FREET4, T3FREE, THYROIDAB in the last 72 hours. Anemia Panel: No results for input(s): VITAMINB12, FOLATE, FERRITIN, TIBC, IRON, RETICCTPCT in the last 72 hours. Urine analysis:    Component Value Date/Time   COLORURINE YELLOW 07/15/2015 1440   APPEARANCEUR CLEAR 07/15/2015 1440   LABSPEC 1.015 07/15/2015 1440   PHURINE 6.0 07/15/2015 1440   GLUCOSEU NEGATIVE 07/15/2015 1440   HGBUR TRACE-LYSED (A) 07/15/2015 1440   BILIRUBINUR NEGATIVE 07/15/2015 1440   KETONESUR TRACE (A) 07/15/2015 1440   PROTEINUR NEGATIVE 07/06/2012 0341   UROBILINOGEN 1.0 07/15/2015 1440   NITRITE NEGATIVE 07/15/2015 1440   LEUKOCYTESUR NEGATIVE 07/15/2015 1440   Sepsis Labs: @LABRCNTIP (procalcitonin:4,lacticidven:4)  ) Recent Results (from the past 240 hour(s))  Respiratory Panel by PCR     Status: Abnormal   Collection Time: 09/21/18  8:22 AM  Result Value Ref Range Status   Adenovirus NOT DETECTED NOT DETECTED Final   Coronavirus 229E NOT DETECTED NOT DETECTED Final   Coronavirus HKU1 NOT DETECTED NOT DETECTED Final   Coronavirus  NL63 NOT DETECTED NOT DETECTED Final   Coronavirus OC43 NOT DETECTED NOT DETECTED Final   Metapneumovirus NOT DETECTED NOT DETECTED Final   Rhinovirus / Enterovirus DETECTED (A) NOT DETECTED Final   Influenza A NOT DETECTED NOT DETECTED Final   Influenza B NOT DETECTED NOT DETECTED Final   Parainfluenza Virus 1 NOT DETECTED NOT DETECTED Final   Parainfluenza Virus 2 NOT DETECTED NOT DETECTED Final   Parainfluenza Virus 3 NOT DETECTED NOT DETECTED Final   Parainfluenza Virus 4 NOT DETECTED NOT DETECTED Final   Respiratory Syncytial Virus NOT DETECTED NOT DETECTED Final   Bordetella pertussis NOT DETECTED NOT DETECTED Final   Chlamydophila pneumoniae NOT DETECTED NOT DETECTED Final   Mycoplasma pneumoniae NOT DETECTED NOT DETECTED Final    Comment:  Performed at [facility], [address], [place], [place] [address]      Studies: No results found.  Scheduled Meds: . amLODipine  10 mg Oral Daily  . aspirin  81 mg Oral q morning - 10a  . atorvastatin  20 mg Oral QHS  . azithromycin  250 mg Oral Daily  . carvedilol  25 mg Oral BID WC  . dextromethorphan-guaiFENesin  2 tablet Oral BID  . enoxaparin (LOVENOX) injection  40 mg Subcutaneous Q24H  . furosemide  40 mg Intravenous BID  . ibuprofen  600 mg Oral Once  . insulin aspart  0-15 Units Subcutaneous TID WC  . insulin aspart  0-5 Units Subcutaneous QHS  . ipratropium-albuterol  3 mL Nebulization Q6H  . latanoprost  1 drop Right Eye QHS  . losartan  50 mg Oral Daily  . methylPREDNISolone (SOLU-MEDROL) injection  40 mg Intravenous TID  . pantoprazole  40 mg Oral q morning - 10a  . sodium chloride flush  3 mL Intravenous Q12H  . sodium chloride HYPERTONIC  4 mL Nebulization TID  . tamsulosin  0.4 mg Oral Daily    Continuous Infusions:   LOS: 4 days     Darlin Drop, MD [facility] Pager [phone]  If 7PM-7AM, please contact night-coverage [email or web address] Password [email or web address] 09/23/2018, 10:48 AM

## 2018-09-23 NOTE — Progress Notes (Signed)
  Echocardiogram 2D Echocardiogram has been performed.  Bradley [name] 09/23/2018, 2:24 PM

## 2018-09-24 ENCOUNTER — Inpatient Hospital Stay (HOSPITAL_COMMUNITY): Payer: Medicare Other

## 2018-09-24 LAB — TROPONIN I: Troponin I: <0.03 ng/mL (ref <0.03)

## 2018-09-24 LAB — GLUCOSE, CAPILLARY
Glucose-Capillary: 249 mg/dL — ABNORMAL HIGH (ref 70–99)
Glucose-Capillary: 269 mg/dL — ABNORMAL HIGH (ref 70–99)

## 2018-09-24 MED ORDER — ZOLPIDEM TARTRATE 5 MG PO TABS
5.0000 mg | ORAL_TABLET | Freq: Every day | ORAL | Status: DC
  Administered 2018-09-24: 5 mg via ORAL
  Filled 2018-09-24: qty 1

## 2018-09-24 MED ORDER — IPRATROPIUM-ALBUTEROL 0.5-2.5 (3) MG/3ML IN SOLN
3.0000 mL | Freq: Three times a day (TID) | RESPIRATORY_TRACT | Status: DC
  Administered 2018-09-25: 3 mL via RESPIRATORY_TRACT
  Filled 2018-09-24 (×2): qty 3

## 2018-09-24 NOTE — Progress Notes (Signed)
Pt called RN into room complaining of increasing chest discomfort oxygen saturation was at 88% at rest on 2L n/c  Bodenheimer,NP notified  Lasix 40 mg & duoneb given, oxygen increased to 3l to maintain oxygen saturation > 92%

## 2018-09-24 NOTE — Progress Notes (Signed)
PROGRESS NOTE  Bradley WINEBARGER Sr. ZOX:[id number] DOB: 12/19/1940 DOA: 09/19/2018 PCP: [name], MD  HPI/Recap of past 74 hours:  77 year old man PMH including diastolic CHF, oxygen dependent COPD presented with increasing shortness of breath, PND, chest pain.  Denied dietary indiscretion, lower extremity edema, worsening dyspnea on exertion.  SPO2 in the 70s prior to application of supplemental oxygen.  Chest x-ray showed mild CHF with interstitial edema.  Admitted for acute on chronic hypoxic respiratory failure secondary to acute diastolic CHF.  09/21/2018: States he is feeling poorly with audible wheezes and persistent dyspnea with minimal movement.  Added Solu-Medrol and pulmonary toilet.  09/22/2018: wheezing with dyspnea on minimal movement.  Denies any chest pain.  09/23/2018: cough and mild wheezing with movements.  09/24/2018: Patient examined at bedside.  Complains of chest pressure.  Troponin and EKG ordered.  Chest x-ray done this a.m. revealed cardiomegaly with increased pulmonary vascularity.  Continue IV Lasix.  Assessment/Plan: Principal Problem:   Acute CHF (congestive heart failure) (HCC) Active Problems:   DM II (diabetes mellitus, type II), controlled (HCC)   Chronic pulmonary hypertension secondary to elevated L H pressures    Acute on chronic respiratory failure with hypoxia (HCC)  Acute on chronic hypoxic respiratory failure suspect multifactorial secondary to acute on chronic diastolic CHF versus acute COPD exacerbation Still dyspneic with minimal movement and persistent audible wheezes noted Positive rhinovirus from acute respiratory viral panel. Negative influenza A&B Continue Z-Pak x5 days Wean off Solu-Medrol from 40 mg 3 times daily to 40 twice daily to prednisone 40 mg daily Continue pulmonary toilet Continue duo nebs  Maintain O2 saturation greater than 92% On 2 L of oxygen by nasal cannula at baseline.   Chest pressure, rule out ACS Obtain  twelve-lead EKG and troponin once Chest x-ray done today revealed cardiomegaly with increase in pulmonary vascularity Last 2D echo done on 09/23/2018 revealed normal LVEF with no wall motion abnormalities  Type 2 diabetes complicated by hyperglycemia, improving Continue insulin sliding scale Increase correction insulin sliding scale to moderate from sensitive due to hyperglycemia On Januvia at home Last hemoglobin A1c 6.3 on 08/03/2018  Acute COPD exacerbation secondary to rhinovirus infection Management as stated above Acute respiratory viral panel positive for rhinovirus Procalcitonin negative less than 0.10  Acute on chronic diastolic CHF Continue IV Lasix 40 mg twice daily Continue to monitor urine output Continue to monitor strict I's and O's and daily weight Independently reviewed chest x-ray done on 09/19/2018 during this admission which revealed small right pleural effusion with increase in pulmonary vascularity and AICD in place. Independently reviewed chest x-ray done today 09/24/2018 which revealed cardiomegaly with increase of pulmonary vascularity. Continue IV Lasix 40 mg twice daily Continue current cardiac medications  Paroxysmal A. fib post AICD placement Twelve-lead EKG done on 09/19/2018 reviewed independently revealed ventricular paced Rate controlled on carvedilol Not on anticoagulation  AKI, resolving Baseline creatinine 0.8 with GFR greater than 60 Presented with creatinine of 1.23 and GFR 56 Creatinine is back to his baseline at 0.8 Continue to avoid nephrotoxic agents/dehydration/hypotension  Physical debility Continue physical therapy Fall precautions  DVT prophylaxis:  Subcu Lovenox daily Code Status: Full Family Communication:  None at bedside Disposition Plan:  Home tomorrow 09/24/18 if no acute events overnight.  Patient requires more diuresis.  On IV Lasix 40 mg twice daily   Objective: Vitals:   09/23/18 1929 09/23/18 2216 09/24/18 0400  09/24/18 0937  BP:  122/72  (!) 126/57  Pulse: 82 69  (!)  57  Resp: 18 18    Temp:  98.5 F (36.9 C)  98.3 F (36.8 C)  TempSrc:  Oral    SpO2: 90% 93%  100%  Weight:   90.3 kg   Height:        Intake/Output Summary (Last 24 hours) at 09/24/2018 1303 Last data filed at 09/24/2018 1234 Gross per 24 hour  Intake 963 ml  Output 2575 ml  Net -1612 ml   Filed Weights   09/22/18 0500 09/23/18 0600 09/24/18 0400  Weight: 89.4 kg 89.6 kg 90.3 kg    Exam:  . General: 77 y.o. year-old male well-developed well-nourished appears uncomfortable due to chest pressure. . Cardiovascular: Regular rate and rhythm with no rubs or gallops.  No JVD or thyromegaly noted. Marland Kitchen Respiratory: Mild wheezes and rales bilaterally.  Good inspiratory effort. . Abdomen: Soft nontender nondistended with normal bowel sounds x4 quadrants. . Musculoskeletal: Trace lower extremity edema. 2/4 pulses in all 4 extremities. Marland Kitchen Psychiatry: Mood is appropriate for condition and setting   Data Reviewed: CBC: Recent Labs  Lab 09/19/18 1219 09/20/18 0328 09/23/18 0301  WBC 12.0* 9.9 7.2  NEUTROABS  --   --  5.8  HGB 15.3 14.0 13.1  HCT 47.9 42.2 39.7  MCV 93.9 90.6 90.4  PLT 204 161 178   Basic Metabolic Panel: Recent Labs  Lab 09/19/18 1219 09/20/18 0328 09/21/18 0315 09/23/18 0301  NA 139 138 140 138  K 3.9 4.3 4.6 4.4  CL 101 100 101 98  CO2 32 30 30 32  GLUCOSE 161* 322* 150* 254*  BUN 12 15 21 21   CREATININE 0.87 1.23 1.15 0.94  CALCIUM 8.7* 9.0 8.5* 8.6*  MG  --  2.2  --  2.5*  PHOS  --   --   --  3.3   GFR: Estimated Creatinine Clearance: 73.1 mL/min (by C-G formula based on SCr of 0.94 mg/dL). Liver Function Tests: Recent Labs  Lab 09/19/18 1219  AST 18  ALT 15  ALKPHOS 95  BILITOT 1.0  PROT 7.6  ALBUMIN 4.1   No results for input(s): LIPASE, AMYLASE in the last 168 hours. No results for input(s): AMMONIA in the last 168 hours. Coagulation Profile: No results for input(s):  INR, PROTIME in the last 168 hours. Cardiac Enzymes: Recent Labs  Lab 09/19/18 1219 09/20/18 1138  TROPONINI <0.03 <0.03   BNP (last 3 results) No results for input(s): PROBNP in the last 8760 hours. HbA1C: No results for input(s): HGBA1C in the last 72 hours. CBG: Recent Labs  Lab 09/23/18 1113 09/23/18 1636 09/23/18 2128 09/24/18 0826 09/24/18 1226  GLUCAP 248* 239* 220* 249* 269*   Lipid Profile: No results for input(s): CHOL, HDL, LDLCALC, TRIG, CHOLHDL, LDLDIRECT in the last 72 hours. Thyroid Function Tests: No results for input(s): TSH, T4TOTAL, FREET4, T3FREE, THYROIDAB in the last 72 hours. Anemia Panel: No results for input(s): VITAMINB12, FOLATE, FERRITIN, TIBC, IRON, RETICCTPCT in the last 72 hours. Urine analysis:    Component Value Date/Time   COLORURINE YELLOW 07/15/2015 1440   APPEARANCEUR CLEAR 07/15/2015 1440   LABSPEC 1.015 07/15/2015 1440   PHURINE 6.0 07/15/2015 1440   GLUCOSEU NEGATIVE 07/15/2015 1440   HGBUR TRACE-LYSED (A) 07/15/2015 1440   BILIRUBINUR NEGATIVE 07/15/2015 1440   KETONESUR TRACE (A) 07/15/2015 1440   PROTEINUR NEGATIVE 07/06/2012 0341   UROBILINOGEN 1.0 07/15/2015 1440   NITRITE NEGATIVE 07/15/2015 1440   LEUKOCYTESUR NEGATIVE 07/15/2015 1440   Sepsis Labs: @LABRCNTIP (procalcitonin:4,lacticidven:4)  ) Recent Results (  from the past 240 hour(s))  Respiratory Panel by PCR     Status: Abnormal   Collection Time: 09/21/18  8:22 AM  Result Value Ref Range Status   Adenovirus NOT DETECTED NOT DETECTED Final   Coronavirus 229E NOT DETECTED NOT DETECTED Final   Coronavirus HKU1 NOT DETECTED NOT DETECTED Final   Coronavirus NL63 NOT DETECTED NOT DETECTED Final   Coronavirus OC43 NOT DETECTED NOT DETECTED Final   Metapneumovirus NOT DETECTED NOT DETECTED Final   Rhinovirus / Enterovirus DETECTED (A) NOT DETECTED Final   Influenza A NOT DETECTED NOT DETECTED Final   Influenza B NOT DETECTED NOT DETECTED Final   Parainfluenza  Virus 1 NOT DETECTED NOT DETECTED Final   Parainfluenza Virus 2 NOT DETECTED NOT DETECTED Final   Parainfluenza Virus 3 NOT DETECTED NOT DETECTED Final   Parainfluenza Virus 4 NOT DETECTED NOT DETECTED Final   Respiratory Syncytial Virus NOT DETECTED NOT DETECTED Final   Bordetella pertussis NOT DETECTED NOT DETECTED Final   Chlamydophila pneumoniae NOT DETECTED NOT DETECTED Final   Mycoplasma pneumoniae NOT DETECTED NOT DETECTED Final    Comment: Performed at [facility], [address], [place], [place] [address]      Studies: Dg Chest Port 1 View  Result Date: 09/24/2018 CLINICAL DATA:  Shortness of breath EXAM: PORTABLE CHEST 1 VIEW COMPARISON:  September 19, 2018 FINDINGS: Stable cardiomegaly. The hila, mediastinum, and pacemaker are stable. Mild opacity in left base is stable and could represent atelectasis versus subtle infiltrate. No other interval changes or acute abnormalities. IMPRESSION: 1. Mild opacity in left base is stable and could represent atelectasis versus subtle infiltrate. No other interval changes. Electronically Signed   By: Gerome Sam III M.D   On: 09/24/2018 07:17    Scheduled Meds: . amLODipine  10 mg Oral Daily  . aspirin  81 mg Oral q morning - 10a  . atorvastatin  20 mg Oral QHS  . azithromycin  250 mg Oral Daily  . carvedilol  25 mg Oral BID WC  . dextromethorphan-guaiFENesin  2 tablet Oral BID  . enoxaparin (LOVENOX) injection  40 mg Subcutaneous Q24H  . furosemide  40 mg Intravenous BID  . insulin aspart  0-15 Units Subcutaneous TID WC  . insulin aspart  0-5 Units Subcutaneous QHS  . ipratropium-albuterol  3 mL Nebulization Q6H  . latanoprost  1 drop Right Eye QHS  . losartan  50 mg Oral Daily  . methylPREDNISolone (SOLU-MEDROL) injection  40 mg Intravenous TID  . pantoprazole  40 mg Oral q morning - 10a  . sodium chloride flush  3 mL Intravenous Q12H  . tamsulosin  0.4 mg Oral Daily  . zolpidem  5 mg Oral QHS    Continuous  Infusions:   LOS: 5 days     Darlin Drop, MD [facility] Pager [phone]  If 7PM-7AM, please contact night-coverage [email or web address] Password [email or web address] 09/24/2018, 1:03 PM

## 2018-09-25 LAB — GLUCOSE, CAPILLARY
Glucose-Capillary: 261 mg/dL — ABNORMAL HIGH (ref 70–99)
Glucose-Capillary: 256 mg/dL — ABNORMAL HIGH (ref 70–99)
Glucose-Capillary: 293 mg/dL — ABNORMAL HIGH (ref 70–99)
Glucose-Capillary: 291 mg/dL — ABNORMAL HIGH (ref 70–99)
Glucose-Capillary: 196 mg/dL — ABNORMAL HIGH (ref 70–99)

## 2018-09-25 MED ORDER — PREDNISONE 20 MG PO TABS: 40.0000 mg | ORAL_TABLET | Freq: Every day | ORAL | Status: DC

## 2018-09-25 MED ORDER — ZOLPIDEM TARTRATE 10 MG PO TABS: 5.0000 mg | ORAL_TABLET | Freq: Every evening | ORAL | 0 refills | Status: DC | PRN

## 2018-09-25 MED ORDER — IPRATROPIUM-ALBUTEROL 0.5-2.5 (3) MG/3ML IN SOLN: 3.0000 mL | Freq: Two times a day (BID) | RESPIRATORY_TRACT | 0 refills | Status: DC | PRN

## 2018-09-25 MED ORDER — FUROSEMIDE 40 MG PO TABS: 40.0000 mg | ORAL_TABLET | Freq: Every day | ORAL | 0 refills | Status: DC

## 2018-09-25 MED ORDER — PREDNISONE 20 MG PO TABS: 20.0000 mg | ORAL_TABLET | Freq: Every day | ORAL | 0 refills | Status: AC

## 2018-09-25 NOTE — Progress Notes (Signed)
Inpatient Diabetes Program Recommendations  AACE/ADA: New Consensus Statement on Inpatient Glycemic Control (2015)  Target Ranges:  Prepandial:   less than 140 mg/dL      Peak postprandial:   less than 180 mg/dL (1-2 hours)      Critically ill patients:  140 - 180 mg/dL   Results for LEONTAE, LEISY SR. (MRN [id number]) as of 09/25/2018 09:23  Ref. Range 09/24/2018 08:26 09/24/2018 12:26 09/24/2018 17:21 09/24/2018 21:03  Glucose-Capillary Latest Ref Range: 70 - 99 mg/dL 262 (H)  5 units NOVOLOG  269 (H)  8 units NOVOLOG  261 (H)  8 units NOVOLOG  256 (H)  3 units NOVOLOG    Results for TYHEIM, METTER (MRN [id number]) as of 09/25/2018 09:23  Ref. Range 09/25/2018 09:12  Glucose-Capillary Latest Ref Range: 70 - 99 mg/dL 384 (H)    Admit with: Acute on chronic hypoxic respiratory failure suspect multifactorial secondary to acute on chronic diastolic CHF versus acute COPD exacerbation  History: DM, CHF, COPD  Home DM Meds: Januvia 100 mg Daily  Current Orders: Novolog Moderate Correction Scale/ SSI (0-15 units) TID AC + HS       Getting Solumedrol 40 mg Q8 hours.  CBGs >200 mg/dl consistently while getting IV Steroids.     MD- Please consider the following in-hospital insulin adjustments while patient getting IV Steroids:  1. Start Lantus 13 units Daily (0.15 units/kg dosing based on weight of 90 kg)  2. Start Novolog Meal Coverage: Novolog 3 units TID with meals  (Please add the following Hold Parameters: Hold if pt eats <50% of meal, Hold if pt NPO)      --Will follow patient during hospitalization--  Ambrose Finland RN, MSN, CDE Diabetes Coordinator Inpatient Glycemic Control Team Team Pager: [phone] (8a-5p)

## 2018-09-25 NOTE — Care Management Note (Addendum)
Case Management Note  Patient Details  Name: HARKIRAT CRAIGE Sr. MRN: [id number] Date of Birth: 01-17-1941  Subjective/Objective:    Patient for dc today, needs HHRN, NCM offered choice, he chose [facility], referral given to [facility] with [facility] for [facility].  Soc will begin 24-48 hrs post dc. He has home oxygen with [facility] that he uses at night.   There is an order for a neb machine, patient states he already has a neb machine at hom.               Action/Plan: DC home when ready.  Expected Discharge Date:  09/25/18               Expected Discharge Plan:  Home w Home Health Services  In-House Referral:     Discharge planning Services  CM Consult  Post Acute Care Choice:  Home Health Choice offered to:  Patient  DME Arranged:    DME Agency:     HH Arranged:  RN HH Agency:  [facility]  Status of Service:  Completed, signed off  If discussed at Long Length of Stay Meetings, dates discussed:    Additional Comments:  Leone Haven, RN 09/25/2018, 1:08 PM

## 2018-09-25 NOTE — Discharge Instructions (Signed)
Shortness of Breath, Adult  Shortness of breath means you have trouble breathing. Your lungs are organs for breathing.  Follow these instructions at home:  Pay attention to any changes in your symptoms. Take these actions to help with your condition:  ? Do not smoke. Smoking can cause shortness of breath. If you need help to quit smoking, ask your doctor.  ? Avoid things that can make it harder to breathe, such as:  ? Mold.  ? Dust.  ? Air pollution.  ? Chemical smells.  ? Things that can cause allergy symptoms (allergens), if you have allergies.  ? Keep your living space clean and free of mold and dust.  ? Rest as needed. Slowly return to your usual activities.  ? Take over-the-counter and prescription medicines, including oxygen and inhaled medicines, only as told by your doctor.  ? Keep all follow-up visits as told by your doctor. This is important.  Contact a doctor if:  ? Your condition does not get better as soon as expected.  ? You have a hard time doing your normal activities, even after you rest.  ? You have new symptoms.  Get help right away if:  ? You have trouble breathing when you are resting.  ? You feel light-headed or you faint.  ? You have a cough that is not helped by medicines.  ? You cough up blood.  ? You have pain with breathing.  ? You have pain in your chest, arms, shoulders, or belly (abdomen).  ? You have a fever.  ? You cannot walk up stairs.  ? You cannot exercise the way you normally do.  This information is not intended to replace advice given to you by your health care provider. Make sure you discuss any questions you have with your health care provider.  Document Released: 03/15/2008 Document Revised: 10/14/2016 Document Reviewed: 10/14/2016  Elsevier Interactive Patient Education ? 2017 [employer]

## 2018-09-25 NOTE — Discharge Summary (Addendum)
Discharge Summary  Bradley BIHM Sr. ZOX:[id number] DOB: 1940-10-14  PCP: [name], MD  Admit date: 09/19/2018 Discharge date: 09/25/2018  Time spent: 35 minutes  Recommendations for Outpatient Follow-up:  1. Follow-up with your PCP 2. Follow-up with your cardiologist 3. Take medications as prescribed  Discharge Diagnoses:  Active Hospital Problems   Diagnosis Date Noted  . Acute CHF (congestive heart failure) (HCC) 09/20/2018  . Acute on chronic respiratory failure with hypoxia (HCC) 12/19/2015  . Chronic pulmonary hypertension secondary to elevated L H pressures  04/15/2009  . DM II (diabetes mellitus, type II), controlled (HCC) 01/02/2007    Resolved Hospital Problems   Diagnosis Date Noted Date Resolved  . Hypoxemia 09/19/2018 09/20/2018  . Heart failure (HCC) 02/21/2017 09/20/2018    Discharge Condition: Stable  Diet recommendation: Resume previous diet  Vitals:   09/25/18 0751 09/25/18 0900  BP:  118/73  Pulse:  70  Resp:    Temp:  (!) 97.5 F (36.4 C)  SpO2: 100% 96%    History of present illness:   77 year old man PMH including diastolic CHF, COPD with chronic hypoxia on 2 L of oxygen by nasal cannula continuously, who presented from home with increasing shortness of breath, PND, chest pain. Denied dietary indiscretion, lower extremity edema, worsening dyspnea on exertion. SPO2 in the 70s prior to application of supplemental oxygen. Chest x-ray showed mild CHF with interstitial edema. Admitted for acute on chronic hypoxic respiratory failure secondary to acute diastolic CHF.  Hospital course complicated by diffuse wheezes, dyspnea with minimal exertion complicated by intermittent chest pain with work-up negative for ACS.  Responded well to breathing treatments, steroids, and diuresis.  09/25/18: Patient seen and examined at his bedside.  No acute events overnight.  He denies any chest pain, dyspnea or palpitations.  No new complaints.  On the day of  discharge, the patient was hemodynamically stable.  He will need to follow-up with his primary care provider and cardiologist posthospitalization.   Hospital Course:  Principal Problem:   Acute CHF (congestive heart failure) (HCC) Active Problems:   DM II (diabetes mellitus, type II), controlled (HCC)   Chronic pulmonary hypertension secondary to elevated L H pressures    Acute on chronic respiratory failure with hypoxia (HCC)  Acute on chronic hypoxic respiratory failure suspect multifactorial secondary to acute on chronic diastolic CHF versus acute COPD exacerbation Positive rhinovirus from acute respiratory viral panel. Negative influenza A&B Completed Z-Pak x5 days Prednisone 40 mg daily x5 days Continue nebs twice daily as needed for wheezing or shortness of breath 2 L of oxygen by nasal cannula continuously  Chest pressure, rule out ACS Twelve-lead EKG and troponin unremarkable  Last 2D echo done on 09/23/2018 revealed normal LVEF with no wall motion abnormalities  Type 2 diabetes complicated by hyperglycemia, improving Resume home diabetic regimen  On Januvia at home Last hemoglobin A1c 6.3 on 08/03/2018  Acute COPD exacerbation secondary to rhinovirus infection Management as stated above Acute respiratory viral panel positive for rhinovirus Procalcitonin negative less than 0.10  Acute on chronic diastolic CHF Continue p.o. Lasix 40 mg daily  Continue to weigh himself every morning post first morning void Continue your cardiac medications Follow-up with your cardiologist  Paroxysmal A. fib post AICD placement Twelve-lead EKG done on 09/19/2018 reviewed independently revealed ventricular paced Rate controlled on carvedilol Not on anticoagulation  AKI, resolving Baseline creatinine 0.8 with GFR greater than 60 Presented with creatinine of 1.23 and GFR 56 Creatinine is back to his baseline  at 0.8 Continue to avoid nephrotoxic  agents/dehydration/hypotension  Physical debility Continue physical therapy Fall precautions   Code Status:Full    Discharge Exam: BP 118/73 (BP Location: Right Arm)   Pulse 70   Temp (!) 97.5 F (36.4 C) (Oral)   Resp 18   Ht 5\' 9"  (1.753 m)   Wt 90.1 kg   SpO2 96%   BMI 29.33 kg/m  . General: 77 y.o. year-old male well developed well nourished in no acute distress.  Alert and oriented x3. . Cardiovascular: Regular rate and rhythm with no rubs or gallops.  No thyromegaly or JVD noted.   Marland Kitchen Respiratory: Clear to auscultation with no wheezes or rales. Good inspiratory effort. . Abdomen: Soft nontender nondistended with normal bowel sounds x4 quadrants. . Musculoskeletal: No lower extremity edema. 2/4 pulses in all 4 extremities. Marland Kitchen Psychiatry: Mood is appropriate for condition and setting  Discharge Instructions You were cared for by a hospitalist during your hospital stay. If you have any questions about your discharge medications or the care you received while you were in the hospital after you are discharged, you can call the unit and asked to speak with the hospitalist on call if the hospitalist that took care of you is not available. Once you are discharged, your primary care physician will handle any further medical issues. Please note that NO REFILLS for any discharge medications will be authorized once you are discharged, as it is imperative that you return to your primary care physician (or establish a relationship with a primary care physician if you do not have one) for your aftercare needs so that they can reassess your need for medications and monitor your lab values.  Discharge Instructions    DME Nebulizer machine   Complete by:  As directed    Patient needs a nebulizer to treat with the following condition:  COPD (chronic obstructive pulmonary disease) (HCC)     Allergies as of 09/25/2018      Reactions   Hydrocodone Other (See Comments)   "given to him in  the hospital; went thru withdrawals once home; dr said not to take it again" (09/27/2012)   Tramadol Other (See Comments)   "given to him in the hospital; went thru withdrawals once home; dr said not to take it again" (09/27/2012)   Tadalafil Other (See Comments)   headache, backache   Avelox [moxifloxacin Hydrochloride] Itching, Other (See Comments)   Headache      Medication List    STOP taking these medications   torsemide 20 MG tablet Commonly known as:  DEMADEX     TAKE these medications   amLODipine 10 MG tablet Commonly known as:  NORVASC Take 1 tablet (10 mg total) by mouth daily.   aspirin 81 MG tablet Take 81 mg by mouth every morning.   atorvastatin 20 MG tablet Commonly known as:  LIPITOR Take 1 tablet (20 mg total) by mouth at bedtime.   bimatoprost 0.01 % Soln Commonly known as:  LUMIGAN Place 1 drop into the right eye at bedtime.   calcium-vitamin D 500-200 MG-UNIT tablet Commonly known as:  OSCAL WITH D Take 1 tablet by mouth every morning.   carvedilol 25 MG tablet Commonly known as:  COREG TAKE 1 TABLET TWICE A DAY WITH MEALS   furosemide 40 MG tablet Commonly known as:  LASIX Take 1 tablet (40 mg total) by mouth daily.   hydroxypropyl methylcellulose / hypromellose 2.5 % ophthalmic solution Commonly known as:  ISOPTO TEARS /  GONIOVISC Place 3 drops into both eyes 3 (three) times daily.   ipratropium-albuterol 0.5-2.5 (3) MG/3ML Soln Commonly known as:  DUONEB Take 3 mLs by nebulization 2 (two) times daily as needed. What changed:    when to take this  reasons to take this   losartan 100 MG tablet Commonly known as:  COZAAR Take 0.5 tablets (50 mg total) by mouth daily. What changed:  when to take this   multivitamins ther. w/minerals Tabs tablet Take 1 tablet by mouth daily.   nitroGLYCERIN 0.4 MG SL tablet Commonly known as:  NITROSTAT DISSOLVE 1 TABLET UNDER THE TONGUE EVERY 5 MINUTES AS NEEDED FOR CHEST PAIN What changed:  See  the new instructions.   onetouch ultrasoft lancets Check blood sugars no more than twice daily   ONETOUCH VERIO test strip Generic drug:  glucose blood CHECK BLOOD SUGAR NO MORE THAN TWICE A DAY   pantoprazole 40 MG tablet Commonly known as:  PROTONIX Take 1 tablet (40 mg total) by mouth every morning.   predniSONE 20 MG tablet Commonly known as:  DELTASONE Take 1 tablet (20 mg total) by mouth daily with breakfast for 5 days. Start taking on:  September 26, 2018   PROLENSA 0.07 % Soln Generic drug:  Bromfenac Sodium Place 1 drop into the right eye at bedtime.   sitaGLIPtin 100 MG tablet Commonly known as:  JANUVIA Take 1 tablet (100 mg total) by mouth daily.   tamsulosin 0.4 MG Caps capsule Commonly known as:  FLOMAX Take 1 capsule (0.4 mg total) by mouth daily.   zolpidem 10 MG tablet Commonly known as:  AMBIEN Take 0.5 tablets (5 mg total) by mouth at bedtime as needed for sleep. What changed:  how much to take            Durable Medical Equipment  (From admission, onward)         Start     Ordered   09/25/18 0000  DME Nebulizer machine    Question:  Patient needs a nebulizer to treat with the following condition  Answer:  COPD (chronic obstructive pulmonary disease) (HCC)   09/25/18 1222         Allergies  Allergen Reactions  . Hydrocodone Other (See Comments)    "given to him in the hospital; went thru withdrawals once home; dr said not to take it again" (09/27/2012)  . Tramadol Other (See Comments)    "given to him in the hospital; went thru withdrawals once home; dr said not to take it again" (09/27/2012)  . Tadalafil Other (See Comments)    headache, backache  . Avelox [Moxifloxacin Hydrochloride] Itching and Other (See Comments)    Headache   Follow-up Information    [name], MD. Call in 1 day(s).   Specialty:  Internal Medicine Why:  Please call for a post hospital follow up appointment Contact information: [address] [place] [address] [phone]        [name], MD .   Specialty:  Cardiology Contact information: [address] [place] [address] [phone]            The results of significant diagnostics from this hospitalization (including imaging, microbiology, ancillary and laboratory) are listed below for reference.    Significant Diagnostic Studies: Dg Chest 2 View  Result Date: 09/19/2018 CLINICAL DATA:  Short of breath chest pain EXAM: CHEST - 2 VIEW COMPARISON:  05/26/2018 FINDINGS: Dual lead pacemaker unchanged.  Heart size within normal limits. Atherosclerotic aortic arch Pulmonary vascular congestion is present with small pleural effusions. Probable mild interstitial edema. No focal consolidation. IMPRESSION: Mild heart failure with small effusions and mild interstitial edema. Electronically Signed   By: Marlan Palau M.D.   On: 09/19/2018 13:08   Dg Chest Port 1 View  Result Date: 09/24/2018 CLINICAL DATA:  Shortness of breath EXAM: PORTABLE CHEST 1 VIEW COMPARISON:  September 19, 2018 FINDINGS: Stable cardiomegaly. The hila, mediastinum, and pacemaker are stable. Mild opacity in left base is stable and could represent atelectasis versus subtle infiltrate. No other interval changes or acute abnormalities. IMPRESSION: 1. Mild opacity in left base is stable and could represent atelectasis versus subtle infiltrate. No other interval changes. Electronically Signed   By: Gerome Sam III M.D   On: 09/24/2018 07:17    Microbiology: Recent Results (from the past 240 hour(s))  Respiratory Panel by PCR     Status: Abnormal   Collection Time: 09/21/18  8:22 AM  Result Value Ref Range Status   Adenovirus NOT DETECTED NOT DETECTED Final   Coronavirus 229E NOT DETECTED NOT DETECTED Final   Coronavirus HKU1 NOT DETECTED NOT DETECTED Final   Coronavirus NL63 NOT DETECTED NOT DETECTED Final   Coronavirus OC43 NOT DETECTED NOT DETECTED Final    Metapneumovirus NOT DETECTED NOT DETECTED Final   Rhinovirus / Enterovirus DETECTED (A) NOT DETECTED Final   Influenza A NOT DETECTED NOT DETECTED Final   Influenza B NOT DETECTED NOT DETECTED Final   Parainfluenza Virus 1 NOT DETECTED NOT DETECTED Final   Parainfluenza Virus 2 NOT DETECTED NOT DETECTED Final   Parainfluenza Virus 3 NOT DETECTED NOT DETECTED Final   Parainfluenza Virus 4 NOT DETECTED NOT DETECTED Final   Respiratory Syncytial Virus NOT DETECTED NOT DETECTED Final   Bordetella pertussis NOT DETECTED NOT DETECTED Final   Chlamydophila pneumoniae NOT DETECTED NOT DETECTED Final   Mycoplasma pneumoniae NOT DETECTED NOT DETECTED Final    Comment: Performed at [facility], [address] [place], [place] [address]     Labs: Basic Metabolic Panel: Recent Labs  Lab 09/19/18 1219 09/20/18 0328 09/21/18 0315 09/23/18 0301  NA 139 138 140 138  K 3.9 4.3 4.6 4.4  CL 101 100 101 98  CO2 32 30 30 32  GLUCOSE 161* 322* 150* 254*  BUN 12 15 21 21   CREATININE 0.87 1.23 1.15 0.94  CALCIUM 8.7* 9.0 8.5* 8.6*  MG  --  2.2  --  2.5*  PHOS  --   --   --  3.3   Liver Function Tests: Recent Labs  Lab 09/19/18 1219  AST 18  ALT 15  ALKPHOS 95  BILITOT 1.0  PROT 7.6  ALBUMIN 4.1   No results for input(s): LIPASE, AMYLASE in the last 168 hours. No results for input(s): AMMONIA in the last 168 hours. CBC: Recent Labs  Lab 09/19/18 1219 09/20/18 0328 09/23/18 0301  WBC 12.0* 9.9 7.2  NEUTROABS  --   --  5.8  HGB 15.3 14.0 13.1  HCT 47.9 42.2 39.7  MCV 93.9 90.6 90.4  PLT 204 161 178   Cardiac Enzymes: Recent Labs  Lab 09/19/18 1219 09/20/18 1138 09/24/18 1320  TROPONINI <0.03 <0.03 <0.03   BNP: BNP (last 3 results) Recent Labs    05/25/18 0842 09/19/18 1219  BNP 307.9* 324.5*    ProBNP (last 3 results) No results for input(s): PROBNP in the last 8760 hours.  CBG: Recent Labs  Lab 09/24/18 1721 09/24/18 2103 09/24/18 2229 09/25/18 0912  09/25/18 1211  GLUCAP 261* 256* 293* 291* 196*       Signed:  Darlin Drop, MD [facility] 09/25/2018, 12:25 PM

## 2018-09-26 ENCOUNTER — Telehealth: Payer: Self-pay

## 2018-09-26 NOTE — Telephone Encounter (Signed)
Orders signed and faxed to [facility] at [phone]. Form sent for scanning.  

## 2018-09-27 ENCOUNTER — Telehealth: Payer: Self-pay

## 2018-09-27 ENCOUNTER — Encounter: Payer: Self-pay | Admitting: Internal Medicine

## 2018-09-27 ENCOUNTER — Ambulatory Visit (INDEPENDENT_AMBULATORY_CARE_PROVIDER_SITE_OTHER): Payer: Medicare Other | Admitting: Internal Medicine

## 2018-09-27 ENCOUNTER — Telehealth: Payer: Self-pay | Admitting: Internal Medicine

## 2018-09-27 VITALS — BP 132/76 | HR 77 | Temp 97.9°F | Resp 18 | Ht 69.0 in | Wt 202.5 lb

## 2018-09-27 DIAGNOSIS — D72829 Elevated white blood cell count, unspecified: Secondary | ICD-10-CM

## 2018-09-27 DIAGNOSIS — I509 Heart failure, unspecified: Secondary | ICD-10-CM

## 2018-09-27 DIAGNOSIS — J9621 Acute and chronic respiratory failure with hypoxia: Secondary | ICD-10-CM

## 2018-09-27 LAB — CBC WITH DIFFERENTIAL/PLATELET
Basophils Absolute: 0 10*3/uL (ref 0.0–0.1)
Basophils Relative: 0.2 % (ref 0.0–3.0)
Eosinophils Absolute: 0.1 10*3/uL (ref 0.0–0.7)
Eosinophils Relative: 0.6 % (ref 0.0–5.0)
HCT: 46 % (ref 39.0–52.0)
Hemoglobin: 15.7 g/dL (ref 13.0–17.0)
Lymphocytes Relative: 18.8 % (ref 12.0–46.0)
Lymphs Abs: 2.7 10*3/uL (ref 0.7–4.0)
MCHC: 34.1 g/dL (ref 30.0–36.0)
MCV: 90.7 fl (ref 78.0–100.0)
Monocytes Absolute: 1.7 10*3/uL — ABNORMAL HIGH (ref 0.1–1.0)
Monocytes Relative: 11.6 % (ref 3.0–12.0)
Neutro Abs: 9.9 10*3/uL — ABNORMAL HIGH (ref 1.4–7.7)
Neutrophils Relative %: 68.8 % (ref 43.0–77.0)
Platelets: 240 10*3/uL (ref 150.0–400.0)
RBC: 5.08 Mil/uL (ref 4.22–5.81)
RDW: 13.6 % (ref 11.5–15.5)
WBC: 14.4 10*3/uL — ABNORMAL HIGH (ref 4.0–10.5)

## 2018-09-27 LAB — BASIC METABOLIC PANEL
BUN: 34 mg/dL — ABNORMAL HIGH (ref 6–23)
CO2: 36 mEq/L — ABNORMAL HIGH (ref 19–32)
Calcium: 8.1 mg/dL — ABNORMAL LOW (ref 8.4–10.5)
Chloride: 93 mEq/L — ABNORMAL LOW (ref 96–112)
Creatinine, Ser: 1.26 mg/dL (ref 0.40–1.50)
GFR: 58.89 mL/min — ABNORMAL LOW (ref >60.00)
Glucose, Bld: 180 mg/dL — ABNORMAL HIGH (ref 70–99)
Potassium: 3.5 mEq/L (ref 3.5–5.1)
Sodium: 139 mEq/L (ref 135–145)

## 2018-09-27 LAB — MAGNESIUM: Magnesium: 2 mg/dL (ref 1.5–2.5)

## 2018-09-27 MED ORDER — FUROSEMIDE 40 MG PO TABS: 40.0000 mg | ORAL_TABLET | Freq: Every day | ORAL | 6 refills | Status: DC

## 2018-09-27 NOTE — Progress Notes (Signed)
Subjective:    Patient ID: Bradley Sit., male    DOB: 1941/08/29, 77 y.o.   MRN: [id number]  DOS:  09/27/2018 Type of visit - description : TCM 7. Admitted to the hospital and discharged 09/25/2018.  Presented to the hospital with increasing shortness of breath.  Initial chest x-ray shows CHF with mild interstitial edema DX was chronic acute on chronic hypoxic respiratory failure, initially thought to be due to CHF but later on a rhinovirus panel came back positive so he was diagnosed with COPD exacerbation and treated accordingly.  He improved. Upon admission, he was switch from torsemide to IV Lasix and he was discharged home on Lasix 40 mg daily.    Wt Readings from Last 3 Encounters:  09/27/18 202 lb 8 oz (91.9 kg)  09/25/18 198 lb 10.2 oz (90.1 kg)  09/11/18 205 lb 12.8 oz (93.4 kg)    Review of Systems Since he left the hospital, he is doing great. No fever chills DOE at baseline. O2 sat at rest 95, with exertion drops to 85%. Cough at baseline.  Occasionally has clear sputum. No lower extremity edema  Past Medical History:  Diagnosis Date  . Abnormal CT scan, chest 2012   CT chest, several lymphadenopathies. Sees pulmonary  . Anemia    intermittent  . Arthritis    "?back" (09/19/2018)  . Atrial fibrillation (HCC)    s/p AV node ablation & BiV PPM implantation 09/08/11 (op dictation pending)  . BPH (benign prostatic hyperplasia)    Saw Dr [name] 2004, normal renal u/s  . Bronchitis 08/26/2017  . CAD (coronary artery disease)   . CHF (congestive heart failure) (HCC)    Thought primarily to be non-systolic although EF down (EF 78-29% 12/2010, down to 35-40% 09/05/11), cath 2008 with no CAD, nuclear study 07/2011 showing Small area of reversibility in the distal ant/lat wall the left ventricle suspicious for ischemia/septal wall HK but felt to be low risk  (per D/C Summary 07/2011)  . Chronic bronchitis (HCC)    "get it ~ q yr"  . Heart murmur   . HLD  (hyperlipidemia)   . HTN (hypertension)   . 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 Insomnia   . Migraines    "very very rare"  . On home oxygen therapy    "2L; 24/7" (09/19/2018)  . Pacemaker   . Peripheral vascular disease (HCC)    ??  . Pleural effusion 2008   S/p decortication  . Pulmonary HTN (HCC)    per cath 2008  . Type II diabetes mellitus (HCC) 1999    Past Surgical History:  Procedure Laterality Date  . BI-VENTRICULAR PACEMAKER INSERTION Left 09/08/2011   Procedure: BI-VENTRICULAR PACEMAKER INSERTION (CRT-P);  Surgeon: Marinus Maw, MD;  Location: [facility] CATH LAB;  Service: Cardiovascular;  Laterality: Left;  . CARDIAC CATHETERIZATION     "couple times; never had balloon or stent" (09/19/2018)  . CATARACT EXTRACTION W/ INTRAOCULAR LENS  IMPLANT, BILATERAL Bilateral 08/2018  . COLONOSCOPY  03/10/11   normal  . INSERT / REPLACE / REMOVE PACEMAKER  09/08/11   pacemaker placement  . LUNG DECORTICATION    . PLEURAL SCARIFICATION    . pneumothorax with fibrothorax  ~ 2010  . TONSILLECTOMY     "as a kid"     Social History   Socioeconomic History  . Marital status: Married    Spouse name: [name]  . Number of children: 1  .  Years of education: Not on file  . Highest education level: Not on file  Occupational History  . Occupation: retired Publishing rights manager: RETIRED  Social Needs  . Financial resource strain: Not hard at all  . Food insecurity:    Worry: Never true    Inability: Never true  . Transportation needs:    Medical: No    Non-medical: No  Tobacco Use  . Smoking status: Former Smoker    Packs/day: 2.50    Years: 25.00    Pack years: 62.50    Types: Cigarettes, Cigars    Last attempt to quit: 10/11/1980    Years since quitting: 37.9  . Smokeless tobacco: Never Used  Substance and Sexual Activity  . Alcohol use: Yes    Comment: 09/19/2018 "maybe 6 pack of beer/year; socially"  . Drug use: No  .  Sexual activity: Not Currently  Lifestyle  . Physical activity:    Days per week: 6 days    Minutes per session: 20 min  . Stress: Not at all  Relationships  . Social connections:    Talks on phone: More than three times a week    Gets together: More than three times a week    Attends religious service: More than 4 times per year    Active member of club or organization: No    Attends meetings of clubs or organizations: Never    Relationship status: Married  . Intimate partner violence:    Fear of current or ex partner: Not on file    Emotionally abused: Not on file    Physically abused: Not on file    Forced sexual activity: Not on file  Other Topics Concern  . Not on file  Social History Narrative   Moved from [place] 2003.Marland Kitchen   He lives w/ his wife, [name]   1 son, substance abuse, passed away 2016/10/08            Allergies as of 09/27/2018      Reactions   Hydrocodone Other (See Comments)   "given to him in the hospital; went thru withdrawals once home; dr said not to take it again" (09/27/2012)   Tramadol Other (See Comments)   "given to him in the hospital; went thru withdrawals once home; dr said not to take it again" (09/27/2012)   Tadalafil Other (See Comments)   headache, backache   Avelox [moxifloxacin Hydrochloride] Itching, Other (See Comments)   Headache      Medication List       Accurate as of September 27, 2018 11:59 PM. Always use your most recent med list.        amLODipine 10 MG tablet Commonly known as:  NORVASC Take 1 tablet (10 mg total) by mouth daily.   aspirin 81 MG tablet Take 81 mg by mouth every morning.   atorvastatin 20 MG tablet Commonly known as:  LIPITOR Take 1 tablet (20 mg total) by mouth at bedtime.   bimatoprost 0.01 % Soln Commonly known as:  LUMIGAN Place 1 drop into the right eye at bedtime.   calcium-vitamin D 500-200 MG-UNIT tablet Commonly known as:  OSCAL WITH D Take 1 tablet by mouth every morning.   carvedilol  25 MG tablet Commonly known as:  COREG TAKE 1 TABLET TWICE A DAY WITH MEALS   furosemide 40 MG tablet Commonly known as:  LASIX Take 1 tablet (40 mg total) by mouth daily.   hydroxypropyl methylcellulose / hypromellose 2.5 %  ophthalmic solution Commonly known as:  ISOPTO TEARS / GONIOVISC Place 3 drops into both eyes 3 (three) times daily.   ipratropium-albuterol 0.5-2.5 (3) MG/3ML Soln Commonly known as:  DUONEB Take 3 mLs by nebulization 2 (two) times daily as needed.   losartan 100 MG tablet Commonly known as:  COZAAR Take 0.5 tablets (50 mg total) by mouth daily.   multivitamins ther. w/minerals Tabs tablet Take 1 tablet by mouth daily.   nitroGLYCERIN 0.4 MG SL tablet Commonly known as:  NITROSTAT DISSOLVE 1 TABLET UNDER THE TONGUE EVERY 5 MINUTES AS NEEDED FOR CHEST PAIN   onetouch ultrasoft lancets Check blood sugars no more than twice daily   ONETOUCH VERIO test strip Generic drug:  glucose blood CHECK BLOOD SUGAR NO MORE THAN TWICE A DAY   pantoprazole 40 MG tablet Commonly known as:  PROTONIX Take 1 tablet (40 mg total) by mouth every morning.   predniSONE 20 MG tablet Commonly known as:  DELTASONE Take 1 tablet (20 mg total) by mouth daily with breakfast for 5 days.   PROLENSA 0.07 % Soln Generic drug:  Bromfenac Sodium Place 1 drop into the right eye at bedtime.   sitaGLIPtin 100 MG tablet Commonly known as:  JANUVIA Take 1 tablet (100 mg total) by mouth daily.   tamsulosin 0.4 MG Caps capsule Commonly known as:  FLOMAX Take 1 capsule (0.4 mg total) by mouth daily.   zolpidem 10 MG tablet Commonly known as:  AMBIEN Take 0.5 tablets (5 mg total) by mouth at bedtime as needed for sleep.           Objective:   Physical Exam BP 132/76 (BP Location: Left Arm, Patient Position: Sitting, Cuff Size: Normal)   Pulse 77   Temp 97.9 F (36.6 C) (Oral)   Resp 18   Ht 5\' 9"  (1.753 m)   Wt 202 lb 8 oz (91.9 kg)   SpO2 90%   BMI 29.90 kg/m    General:   Well developed, NAD, BMI noted. HEENT:  Normocephalic . Face symmetric, atraumatic Lungs:  Reduced breath sounds Normal respiratory effort, no intercostal retractions, no accessory muscle use. Heart: RRR,  no murmur.  No pretibial edema bilaterally  Skin: Not pale. Not jaundice Neurologic:  alert & oriented X3.  Speech normal, gait appropriate for age and unassisted Psych--  Cognition and judgment appear intact.  Cooperative with normal attention span and concentration.  Behavior appropriate. No anxious or depressed appearing.      Assessment & Plan:    Assessment  DM HTN Hyperlipidemia Anxiety- insomnia  BPH Cardiovascular:Dr Ladona Ridgel  --CAD -- CHF --Atrial fibrillation;  --Pacemaker --h/o IC bleeding: Not anticoagulated --Peripheral vascular disease? Pulmonary: Dr [name], last OV 10-18-2014, f/u prn --COPD, severe, chronic resp failure, night O2 prn, has portable O2  --Pleural effusion s/p decortication --Pulmonary hypertension  Stasis dermatitis   PLAN: Acute on chronic hypoxic respiratory failure: Felt to be due to a rhinovirus, improving on prednisone, had a Z-Pak.  Back to baseline. O2 sat at home usually 95%, drops to 85% with exertion, okay to increase oxygen to 3 L when he exercises. COPD: As above, using insentive spirometry provided at the hospital and he thinks is helping, encouraged to continue that daily. CHF: When he was admitted to the hospital, torsemide 60 mg twice daily was switched to IV Lasix and subsequently he was discharged on Lasix 40 mg daily.  So far he is doing well.  No change, check a BMP, magnesium and CBC.  RTC  as a schedule 11/2018

## 2018-09-27 NOTE — Progress Notes (Signed)
Pre visit review using our clinic review tool, if applicable. No additional management support is needed unless otherwise documented below in the visit note. 

## 2018-09-27 NOTE — Telephone Encounter (Signed)
09/27/18  Transition Care Management Follow-up Telephone Call  ADMISSION DATE: 09/19/18  DISCHARGE DATE: 09/25/18  How have you been since you were released from the hospital? Patient  is doing good per wife  Do you understand why you were in the hospital? Yes    Do you understand the discharge instrcutions? Yes  Items Reviewed:  Medications reviewed: Wife states she and RN Home Health reviewed medications this morniing.   Allergies reviewed: Yes   Dietary changes reviewed: Diabetic per wife   Referrals reviewed:   Functional Questionnaire:   Activities of Daily Living (ADLs): Per patietns wife patietn can perform all ADLS's independently.  Any patient concerns? Wife states patient will discuss a medication with provider when the   Confirmed importance and date/time of follow-up visits scheduled:Yes   Confirmed with patient if condition begins to worsen call PCP or go to the ER. Yes    Patient was given the office number and encouragred to call back with questions or concerns. Yes

## 2018-09-27 NOTE — Patient Instructions (Addendum)
GO TO THE LAB : Get the blood work     GO TO THE FRONT DESK Schedule your next appointment for a  Check up by 11/2018   Check yor weight at home daily, call if the weight increase more than 4 pounds in a week

## 2018-09-27 NOTE — Telephone Encounter (Signed)
Spoke w/ Marylene Land, verbal orders given.

## 2018-09-27 NOTE — Telephone Encounter (Signed)
Copied from CRM [id number]. Topic: Quick Communication - Home Health Verbal Orders >> Sep 27, 2018 10:10 AM Fanny Bien wrote: Caller/Agency: Angela/[facility]  Callback Number: [phone] Requesting nursing services for disease process education and medication management  Frequency: 1x9

## 2018-09-28 NOTE — Assessment & Plan Note (Signed)
Acute on chronic hypoxic respiratory failure: Felt to be due to a rhinovirus, improving on prednisone, had a Z-Pak.  Back to baseline. O2 sat at home usually 95%, drops to 85% with exertion, okay to increase oxygen to 3 L when he exercises. COPD: As above, using insentive spirometry provided at the hospital and he thinks is helping, encouraged to continue that daily. CHF: When he was admitted to the hospital, torsemide 60 mg twice daily was switched to IV Lasix and subsequently he was discharged on Lasix 40 mg daily.  So far he is doing well.  No change, check a BMP, magnesium and CBC. RTC  as a schedule 11/2018

## 2018-09-28 NOTE — Addendum Note (Signed)
Addended byConrad [place] D on: 09/28/2018 04:46 PM   Modules accepted: Orders

## 2018-10-06 ENCOUNTER — Telehealth: Payer: Self-pay | Admitting: *Deleted

## 2018-10-06 NOTE — Telephone Encounter (Signed)
Received Home Health Certification and Plan of Care; forwarded to provider/SLS 12/27

## 2018-10-10 DIAGNOSIS — Z950 Presence of cardiac pacemaker: Secondary | ICD-10-CM

## 2018-10-10 DIAGNOSIS — I480 Paroxysmal atrial fibrillation: Secondary | ICD-10-CM

## 2018-10-10 DIAGNOSIS — I5033 Acute on chronic diastolic (congestive) heart failure: Secondary | ICD-10-CM

## 2018-10-10 DIAGNOSIS — J441 Chronic obstructive pulmonary disease with (acute) exacerbation: Secondary | ICD-10-CM

## 2018-10-10 DIAGNOSIS — J9621 Acute and chronic respiratory failure with hypoxia: Secondary | ICD-10-CM

## 2018-10-10 DIAGNOSIS — E1165 Type 2 diabetes mellitus with hyperglycemia: Secondary | ICD-10-CM

## 2018-10-10 DIAGNOSIS — J069 Acute upper respiratory infection, unspecified: Secondary | ICD-10-CM

## 2018-10-10 DIAGNOSIS — Z7982 Long term (current) use of aspirin: Secondary | ICD-10-CM

## 2018-10-10 DIAGNOSIS — Z87891 Personal history of nicotine dependence: Secondary | ICD-10-CM

## 2018-10-10 DIAGNOSIS — Z9981 Dependence on supplemental oxygen: Secondary | ICD-10-CM

## 2018-10-10 DIAGNOSIS — I272 Pulmonary hypertension, unspecified: Secondary | ICD-10-CM

## 2018-10-10 DIAGNOSIS — Z95810 Presence of automatic (implantable) cardiac defibrillator: Secondary | ICD-10-CM

## 2018-10-10 DIAGNOSIS — I110 Hypertensive heart disease with heart failure: Secondary | ICD-10-CM

## 2018-10-10 DIAGNOSIS — I2510 Atherosclerotic heart disease of native coronary artery without angina pectoris: Secondary | ICD-10-CM

## 2018-10-12 NOTE — Telephone Encounter (Signed)
Plan of care signed and faxed to [facility] at [phone]. Form sent for scanning.  

## 2018-10-13 ENCOUNTER — Other Ambulatory Visit: Payer: Self-pay | Admitting: Internal Medicine

## 2018-10-13 MED ORDER — FUROSEMIDE 40 MG PO TABS: 40.0000 mg | ORAL_TABLET | Freq: Every day | ORAL | 5 refills | Status: DC

## 2018-10-13 NOTE — Telephone Encounter (Signed)
Pharmacy change

## 2018-10-13 NOTE — Telephone Encounter (Signed)
Copied from CRM [id number]. Topic: Quick Communication - Rx Refill/Question >> Oct 13, 2018  9:49 AM Arlyss Gandy, NT wrote: Medication: furosemide (LASIX) 40 MG tablet   Pt states this needs to be sent to [facility] instead of [facility].   Has the patient contacted their pharmacy? Yes.   (Agent: If no, request that the patient contact the pharmacy for the refill.) (Agent: If yes, when and what did the pharmacy advise?)  Preferred Pharmacy (with phone number or street name): [facility] - [place], [place] - [address] [phone] (Phone) [phone] (Fax)    Agent: Please be advised that RX refills may take up to 3 business days. We ask that you follow-up with your pharmacy.

## 2018-10-16 ENCOUNTER — Other Ambulatory Visit: Payer: Self-pay

## 2018-10-16 MED ORDER — FUROSEMIDE 40 MG PO TABS: 40.0000 mg | ORAL_TABLET | Freq: Every day | ORAL | 1 refills | Status: DC

## 2018-10-19 ENCOUNTER — Other Ambulatory Visit (INDEPENDENT_AMBULATORY_CARE_PROVIDER_SITE_OTHER): Payer: Medicare Other

## 2018-10-19 DIAGNOSIS — D72829 Elevated white blood cell count, unspecified: Secondary | ICD-10-CM

## 2018-10-19 DIAGNOSIS — E875 Hyperkalemia: Secondary | ICD-10-CM

## 2018-10-19 LAB — CBC WITH DIFFERENTIAL/PLATELET
Basophils Absolute: 0 10*3/uL (ref 0.0–0.1)
Basophils Relative: 0.5 % (ref 0.0–3.0)
Eosinophils Absolute: 0.1 10*3/uL (ref 0.0–0.7)
Eosinophils Relative: 2.9 % (ref 0.0–5.0)
HCT: 41.7 % (ref 39.0–52.0)
Hemoglobin: 14.2 g/dL (ref 13.0–17.0)
Lymphocytes Relative: 24.4 % (ref 12.0–46.0)
Lymphs Abs: 1.2 10*3/uL (ref 0.7–4.0)
MCHC: 34 g/dL (ref 30.0–36.0)
MCV: 90.9 fl (ref 78.0–100.0)
Monocytes Absolute: 0.5 10*3/uL (ref 0.1–1.0)
Monocytes Relative: 10 % (ref 3.0–12.0)
Neutro Abs: 3.1 10*3/uL (ref 1.4–7.7)
Neutrophils Relative %: 62.2 % (ref 43.0–77.0)
Platelets: 293 10*3/uL (ref 150.0–400.0)
RBC: 4.59 Mil/uL (ref 4.22–5.81)
RDW: 14 % (ref 11.5–15.5)
WBC: 4.9 10*3/uL (ref 4.0–10.5)

## 2018-10-19 LAB — BASIC METABOLIC PANEL
BUN: 12 mg/dL (ref 6–23)
CO2: 30 mEq/L (ref 19–32)
Calcium: 8.8 mg/dL (ref 8.4–10.5)
Chloride: 104 mEq/L (ref 96–112)
Creatinine, Ser: 0.84 mg/dL (ref 0.40–1.50)
GFR: 94.01 mL/min (ref >60.00)
Glucose, Bld: 137 mg/dL — ABNORMAL HIGH (ref 70–99)
Potassium: 5.3 mEq/L — ABNORMAL HIGH (ref 3.5–5.1)
Sodium: 140 mEq/L (ref 135–145)

## 2018-10-20 LAB — CALCIUM, IONIZED: Calcium, Ion: 5.13 mg/dL (ref 4.8–5.6)

## 2018-10-24 LAB — CUP PACEART REMOTE DEVICE CHECK
Battery Remaining Longevity: 46 mo
Battery Voltage: 2.96 V
Brady Statistic AP VP Percent: 0 %
Brady Statistic AP VS Percent: 0 %
Brady Statistic AS VP Percent: 98.59 %
Brady Statistic AS VS Percent: 1.41 %
Brady Statistic RA Percent Paced: 0 %
Brady Statistic RV Percent Paced: 99.36 %
Date Time Interrogation Session: 20191118183944
Implantable Lead Implant Date: 20121128
Implantable Lead Implant Date: 20121128
Implantable Lead Location: 753858
Implantable Lead Location: 753860
Implantable Lead Model: 4194
Implantable Lead Model: 5076
Implantable Pulse Generator Implant Date: 20121128
Lead Channel Impedance Value: 323 Ohm
Lead Channel Impedance Value: 4047 Ohm
Lead Channel Impedance Value: 4047 Ohm
Lead Channel Impedance Value: 437 Ohm
Lead Channel Impedance Value: 456 Ohm
Lead Channel Impedance Value: 494 Ohm
Lead Channel Impedance Value: 551 Ohm
Lead Channel Impedance Value: 570 Ohm
Lead Channel Impedance Value: 627 Ohm
Lead Channel Pacing Threshold Amplitude: 0.875 V
Lead Channel Pacing Threshold Amplitude: 1.25 V
Lead Channel Pacing Threshold Pulse Width: 0.4 ms
Lead Channel Pacing Threshold Pulse Width: 0.4 ms
Lead Channel Sensing Intrinsic Amplitude: 4.125 mV
Lead Channel Sensing Intrinsic Amplitude: 4.125 mV
Lead Channel Setting Pacing Amplitude: 2 V
Lead Channel Setting Pacing Amplitude: 2.25 V
Lead Channel Setting Pacing Pulse Width: 0.4 ms
Lead Channel Setting Pacing Pulse Width: 0.4 ms
Lead Channel Setting Sensing Sensitivity: 4 mV

## 2018-10-24 NOTE — Addendum Note (Signed)
Addended byConrad [place] D on: 10/24/2018 08:12 AM   Modules accepted: Orders

## 2018-10-28 ENCOUNTER — Other Ambulatory Visit: Payer: Self-pay | Admitting: Internal Medicine

## 2018-11-01 ENCOUNTER — Ambulatory Visit: Payer: Medicare Other | Admitting: Cardiology

## 2018-11-06 ENCOUNTER — Other Ambulatory Visit: Payer: Self-pay | Admitting: Internal Medicine

## 2018-11-14 ENCOUNTER — Ambulatory Visit: Payer: Medicare Other | Admitting: Cardiology

## 2018-11-15 ENCOUNTER — Telehealth: Payer: Self-pay

## 2018-11-15 NOTE — Telephone Encounter (Signed)
noted 

## 2018-11-15 NOTE — Telephone Encounter (Signed)
Copied from CRM [id number]. Topic: Quick Communication - Home Health Verbal Orders >> Nov 15, 2018  8:29 AM Trula Slade wrote: Caller/Agency:   Patty Nurse w/[facility] Callback Number:   [phone]  She wanted the provider to know that the patient is discharged as of yesterday, 11/14/2018.

## 2018-11-15 NOTE — Telephone Encounter (Signed)
Just an FYI

## 2018-11-16 ENCOUNTER — Ambulatory Visit (INDEPENDENT_AMBULATORY_CARE_PROVIDER_SITE_OTHER): Payer: Medicare Other | Admitting: Cardiology

## 2018-11-16 ENCOUNTER — Encounter: Payer: Self-pay | Admitting: Cardiology

## 2018-11-16 VITALS — BP 114/62 | HR 88 | Ht 69.0 in | Wt 216.0 lb

## 2018-11-16 DIAGNOSIS — I428 Other cardiomyopathies: Secondary | ICD-10-CM | POA: Diagnosis not present

## 2018-11-16 DIAGNOSIS — J9611 Chronic respiratory failure with hypoxia: Secondary | ICD-10-CM

## 2018-11-16 DIAGNOSIS — Z950 Presence of cardiac pacemaker: Secondary | ICD-10-CM

## 2018-11-16 DIAGNOSIS — I482 Chronic atrial fibrillation, unspecified: Secondary | ICD-10-CM

## 2018-11-16 NOTE — Assessment & Plan Note (Addendum)
Chronic O2- followed by [place] [facility]

## 2018-11-16 NOTE — Assessment & Plan Note (Signed)
MDT CRT-P 2012 

## 2018-11-16 NOTE — Assessment & Plan Note (Signed)
H/O AVN ablation No Coumadin due to an intracranial bleed 2013 

## 2018-11-16 NOTE — Patient Instructions (Signed)
Medication Instructions:  Your physician recommends that you continue on your current medications as directed. Please refer to the Current Medication list given to you today. If you need a refill on your cardiac medications before your next appointment, please call your pharmacy.   Lab work: None  If you have labs (blood work) drawn today and your tests are completely normal, you will receive your results only by: . MyChart Message (if you have MyChart) OR . A paper copy in the mail If you have any lab test that is abnormal or we need to change your treatment, we will call you to review the results.  Testing/Procedures: None   Follow-Up: At [facility], you and your health needs are our priority.  As part of our continuing mission to provide you with exceptional heart care, we have created designated Provider Care Teams.  These Care Teams include your primary Cardiologist (physician) and Advanced Practice Providers (APPs -  Physician Assistants and Nurse Practitioners) who all work together to provide you with the care you need, when you need it. You will need a follow up appointment in 6 months.  Please call our office 2 months in advance to schedule this appointment.  You may see Tiffany , MD or one of the following Advanced Practice Providers on your designated Care Team:   Luke Kilroy, PA-C Krista Kroeger, PA-C . Callie Goodrich, PA-C  Any Other Special Instructions Will Be Listed Below (If Applicable).    

## 2018-11-16 NOTE — Progress Notes (Signed)
11/16/2018 Bradley SearlesJohn J Osland Sr.   08/20/1941  [id number]  Primary Physician Bradley PlumpPaz, Bradley E, MD Primary Cardiologist: Dr [name]-Dr [name] EP  HPI: The patient is a pleasant 78 year old male followed by Dr. Duke [name] and Dr. [name].  He has a history of remote nonischemic cardiomyopathy.  He had a Medtronic CRT device placed in 2012.  His ejection fraction normalized.  He is also had atrial fibrillation and has had an AV node ablation.  Prior catheterization in 2005 in 2018 have shown no significant coronary disease.  His last echocardiogram was in May 2018 and his ejection fraction was 55 to 60%.  That was during an admission for COPD exacerbation as well as combined diastolic heart failure.  He has significant COPD.  He is on oxygen at night and as needed around the house.  His PA pressure at echo was 55mmHg.  Since we saw him during that admission in May 2018 he is been admitted twice, once in August 2019 and again in December 2019.  Each time was for COPD.  We did not see him during either admission.  He tells me today that his heart is been doing fine, he denies any tachycardia or palpitations.  He is not had lower extremity edema.  He is chronically short of breath but does not feel he is volume overloaded.   Current Outpatient Medications  Medication Sig Dispense Refill  . amLODipine (NORVASC) 10 MG tablet Take 1 tablet (10 mg total) by mouth daily. 90 tablet 3  . aspirin 81 MG tablet Take 81 mg by mouth every morning.     Marland Kitchen. atorvastatin (LIPITOR) 20 MG tablet Take 1 tablet (20 mg total) by mouth at bedtime. 90 tablet 3  . calcium-vitamin D (OSCAL WITH D) 500-200 MG-UNIT tablet Take 1 tablet by mouth every morning.    . carvedilol (COREG) 25 MG tablet TAKE 1 TABLET TWICE A DAY WITH MEALS (Patient taking differently: Take 25 mg by mouth 2 (two) times daily with a meal. ) 180 tablet 1  . furosemide (LASIX) 40 MG tablet Take 1 tablet (40 mg total) by mouth daily. 90 tablet 1  . ipratropium-albuterol  (DUONEB) 0.5-2.5 (3) MG/3ML SOLN Take 3 mLs by nebulization 2 (two) times daily as needed. 75 mL 0  . Lancets (ONETOUCH ULTRASOFT) lancets Check blood sugars no more than twice daily 200 each 12  . losartan (COZAAR) 100 MG tablet Take 0.5 tablets (50 mg total) by mouth daily. (Patient taking differently: Take 50 mg by mouth at bedtime. ) 45 tablet 2  . Multiple Vitamins-Minerals (MULTIVITAMINS THER. W/MINERALS) TABS tablet Take 1 tablet by mouth daily. 30 each 5  . ONETOUCH VERIO test strip CHECK BLOOD SUGAR NO MORE THAN TWICE A DAY 200 each 12  . pantoprazole (PROTONIX) 40 MG tablet Take 1 tablet (40 mg total) by mouth every morning. 90 tablet 3  . sitaGLIPtin (JANUVIA) 100 MG tablet Take 1 tablet (100 mg total) by mouth daily. 90 tablet 1  . tamsulosin (FLOMAX) 0.4 MG CAPS capsule Take 1 capsule (0.4 mg total) by mouth daily. 90 capsule 3  . zolpidem (AMBIEN) 10 MG tablet Take 0.5 tablets (5 mg total) by mouth at bedtime as needed for sleep. 20 tablet 0  . nitroGLYCERIN (NITROSTAT) 0.4 MG SL tablet DISSOLVE 1 TABLET UNDER THE TONGUE EVERY 5 MINUTES AS NEEDED FOR CHEST PAIN (Patient not taking: No sig reported) 50 tablet 3   No current facility-administered medications for this visit.  Allergies  Allergen Reactions  . Hydrocodone Other (See Comments)    "given to him in the hospital; went thru withdrawals once home; dr said not to take it again" (09/27/2012)  . Tramadol Other (See Comments)    "given to him in the hospital; went thru withdrawals once home; dr said not to take it again" (09/27/2012)  . Tadalafil Other (See Comments)    headache, backache  . Avelox [Moxifloxacin Hydrochloride] Itching and Other (See Comments)    Headache    Past Medical History:  Diagnosis Date  . Abnormal CT scan, chest 2012   CT chest, several lymphadenopathies. Sees pulmonary  . Anemia    intermittent  . Arthritis    "?back" (09/19/2018)  . Atrial fibrillation (HCC)    s/p AV node ablation &  BiV PPM implantation 09/08/11 (op dictation pending)  . BPH (benign prostatic hyperplasia)    Saw Dr Bradley [name] 2004, normal renal u/s  . Bronchitis 08/26/2017  . CAD (coronary artery disease)   . CHF (congestive heart failure) (HCC)    Thought primarily to be non-systolic although EF down (EF 93-23% 12/2010, down to 35-40% 09/05/11), cath 2008 with no CAD, nuclear study 07/2011 showing Small area of reversibility in the distal ant/lat wall the left ventricle suspicious for ischemia/septal wall HK but felt to be low risk  (per D/C Summary 07/2011)  . Chronic bronchitis (HCC)    "get it ~ q yr"  . Heart murmur   . HLD (hyperlipidemia)   . HTN (hypertension)   . 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 Insomnia   . Migraines    "very very rare"  . On home oxygen therapy    "2L; 24/7" (09/19/2018)  . Pacemaker   . Peripheral vascular disease (HCC)    ??  . Pleural effusion 2008   S/p decortication  . Pulmonary HTN (HCC)    per cath 2008  . Type II diabetes mellitus (HCC) 1999    Social History   Socioeconomic History  . Marital status: Married    Spouse name: [name]  . Number of children: 1  . Years of education: Not on file  . Highest education level: Not on file  Occupational History  . Occupation: retired Publishing rights manager: RETIRED  Social Needs  . Financial resource strain: Not hard at all  . Food insecurity:    Worry: Never true    Inability: Never true  . Transportation needs:    Medical: No    Non-medical: No  Tobacco Use  . Smoking status: Former Smoker    Packs/day: 2.50    Years: 25.00    Pack years: 62.50    Types: Cigarettes, Cigars    Last attempt to quit: 10/11/1980    Years since quitting: 38.1  . Smokeless tobacco: Never Used  Substance and Sexual Activity  . Alcohol use: Yes    Comment: 09/19/2018 "maybe 6 pack of beer/year; socially"  . Drug use: No  . Sexual activity: Not Currently  Lifestyle  .  Physical activity:    Days per week: 6 days    Minutes per session: 20 min  . Stress: Not at all  Relationships  . Social connections:    Talks on phone: More than three times a week    Gets together: More than three times a week    Attends religious service: More than 4 times per year    Active member of  club or organization: No    Attends meetings of clubs or organizations: Never    Relationship status: Married  . Intimate partner violence:    Fear of current or ex partner: Not on file    Emotionally abused: Not on file    Physically abused: Not on file    Forced sexual activity: Not on file  Other Topics Concern  . Not on file  Social History Narrative   Moved from [place] 2003.Marland Kitchen   He lives w/ his wife, [name]   1 son, substance abuse, passed away 09/22/16           Family History  Problem Relation Age of Onset  . Diabetes Father   . Coronary artery disease Father   . Polycythemia Mother   . Drug abuse Son   . Breast cancer Maternal Aunt   . Prostate cancer Neg Hx   . Colon cancer Neg Hx      Review of Systems: General: negative for chills, fever, night sweats or weight changes.  Cardiovascular: negative for chest pain, dyspnea on exertion, edema, orthopnea, palpitations, paroxysmal nocturnal dyspnea or shortness of breath Dermatological: negative for rash Respiratory: negative for cough or wheezing Urologic: negative for hematuria Abdominal: negative for nausea, vomiting, diarrhea, bright red blood per rectum, melena, or hematemesis Neurologic: negative for visual changes, syncope, or dizziness All other systems reviewed and are otherwise negative except as noted above.    Blood pressure 114/62, pulse 88, height 5\' 9"  (1.753 m), weight 216 lb (98 kg).  General appearance: alert, cooperative, no distress and mild SOB at rest, portable O2 with patient Neck: no carotid bruit and no JVD Lungs: decreased breath sounds but no rales or wheezing Heart: regular rate and  rhythm Extremities: no edema Neurologic: Grossly normal  EKG V paced  ASSESSMENT AND PLAN:   Non-ischemic cardiomyopathy (HCC) CRT responder- EF 55-60% May 2018  Pacemaker-Medtronic MDT CRT-P 2012  Chronic respiratory failure Chronic O2- followed by [facility]  Permanent atrial fibrillation H/O AVN ablation No Coumadin due to an intracranial bleed 2013   PLAN  No change in Rx- f/u Dr Bradley Salvia in 6 months  [name] PA-C 11/16/2018 4:15 PM

## 2018-11-16 NOTE — Assessment & Plan Note (Signed)
CRT responder- EF 55-60% May 2018

## 2018-11-17 ENCOUNTER — Other Ambulatory Visit (INDEPENDENT_AMBULATORY_CARE_PROVIDER_SITE_OTHER): Payer: Medicare Other

## 2018-11-17 DIAGNOSIS — E875 Hyperkalemia: Secondary | ICD-10-CM | POA: Diagnosis not present

## 2018-11-17 LAB — BASIC METABOLIC PANEL
BUN: 14 mg/dL (ref 6–23)
CO2: 31 mEq/L (ref 19–32)
Calcium: 9.3 mg/dL (ref 8.4–10.5)
Chloride: 102 mEq/L (ref 96–112)
Creatinine, Ser: 0.86 mg/dL (ref 0.40–1.50)
GFR: 86.06 mL/min (ref >60.00)
Glucose, Bld: 133 mg/dL — ABNORMAL HIGH (ref 70–99)
Potassium: 4.7 mEq/L (ref 3.5–5.1)
Sodium: 141 mEq/L (ref 135–145)

## 2018-11-25 ENCOUNTER — Other Ambulatory Visit: Payer: Self-pay | Admitting: Internal Medicine

## 2018-11-27 ENCOUNTER — Ambulatory Visit (INDEPENDENT_AMBULATORY_CARE_PROVIDER_SITE_OTHER): Payer: Medicare Other

## 2018-11-27 DIAGNOSIS — I428 Other cardiomyopathies: Secondary | ICD-10-CM

## 2018-11-27 DIAGNOSIS — I5032 Chronic diastolic (congestive) heart failure: Secondary | ICD-10-CM

## 2018-11-27 LAB — CUP PACEART REMOTE DEVICE CHECK
Battery Remaining Longevity: 41 mo
Battery Voltage: 2.96 V
Brady Statistic AP VP Percent: 0 %
Brady Statistic AP VS Percent: 0 %
Brady Statistic AS VP Percent: 98.04 %
Brady Statistic AS VS Percent: 1.96 %
Brady Statistic RA Percent Paced: 0 %
Brady Statistic RV Percent Paced: 99.14 %
Date Time Interrogation Session: 20200217143253
Implantable Lead Implant Date: 20121128
Implantable Lead Implant Date: 20121128
Implantable Lead Location: 753858
Implantable Lead Location: 753860
Implantable Lead Model: 4194
Implantable Lead Model: 5076
Implantable Pulse Generator Implant Date: 20121128
Lead Channel Impedance Value: 323 Ohm
Lead Channel Impedance Value: 4047 Ohm
Lead Channel Impedance Value: 4047 Ohm
Lead Channel Impedance Value: 418 Ohm
Lead Channel Impedance Value: 437 Ohm
Lead Channel Impedance Value: 513 Ohm
Lead Channel Impedance Value: 570 Ohm
Lead Channel Impedance Value: 570 Ohm
Lead Channel Impedance Value: 589 Ohm
Lead Channel Pacing Threshold Amplitude: 0.75 V
Lead Channel Pacing Threshold Amplitude: 1 V
Lead Channel Pacing Threshold Pulse Width: 0.4 ms
Lead Channel Pacing Threshold Pulse Width: 0.4 ms
Lead Channel Sensing Intrinsic Amplitude: 4.5 mV
Lead Channel Sensing Intrinsic Amplitude: 4.5 mV
Lead Channel Setting Pacing Amplitude: 2 V
Lead Channel Setting Pacing Amplitude: 2 V
Lead Channel Setting Pacing Pulse Width: 0.4 ms
Lead Channel Setting Pacing Pulse Width: 0.4 ms
Lead Channel Setting Sensing Sensitivity: 4 mV

## 2018-12-03 ENCOUNTER — Other Ambulatory Visit: Payer: Self-pay | Admitting: Internal Medicine

## 2018-12-05 ENCOUNTER — Ambulatory Visit: Payer: Medicare Other | Admitting: Internal Medicine

## 2018-12-05 ENCOUNTER — Telehealth: Payer: Self-pay | Admitting: Internal Medicine

## 2018-12-05 NOTE — Telephone Encounter (Signed)
Pt dropped off document to be filled out by provider ( Parking Disability Placard 1 page) Pt would like to be called when ready to pick up tel [phone]. Document put at front office under providers name.

## 2018-12-06 NOTE — Telephone Encounter (Signed)
Completed as much as possible; forwarded to provider/SLS 02/26

## 2018-12-06 NOTE — Progress Notes (Signed)
Remote pacemaker transmission.   

## 2018-12-07 NOTE — Telephone Encounter (Signed)
With provider, will make Pt aware when ready.

## 2018-12-07 NOTE — Telephone Encounter (Signed)
LMOM informing Pt that handicap placard form is ready for pick up. Copy of form sent for scanning.

## 2018-12-07 NOTE — Telephone Encounter (Signed)
Pt would like a call back when it is ready

## 2018-12-11 LAB — HM DIABETES EYE EXAM

## 2018-12-12 ENCOUNTER — Ambulatory Visit (INDEPENDENT_AMBULATORY_CARE_PROVIDER_SITE_OTHER): Payer: Medicare Other | Admitting: Internal Medicine

## 2018-12-12 ENCOUNTER — Encounter: Payer: Self-pay | Admitting: Internal Medicine

## 2018-12-12 VITALS — BP 128/68 | HR 89 | Temp 98.5°F | Resp 16 | Ht 69.0 in | Wt 207.1 lb

## 2018-12-12 DIAGNOSIS — E785 Hyperlipidemia, unspecified: Secondary | ICD-10-CM | POA: Diagnosis not present

## 2018-12-12 DIAGNOSIS — I25810 Atherosclerosis of coronary artery bypass graft(s) without angina pectoris: Secondary | ICD-10-CM

## 2018-12-12 DIAGNOSIS — I10 Essential (primary) hypertension: Secondary | ICD-10-CM | POA: Diagnosis not present

## 2018-12-12 DIAGNOSIS — E1159 Type 2 diabetes mellitus with other circulatory complications: Secondary | ICD-10-CM

## 2018-12-12 DIAGNOSIS — I5032 Chronic diastolic (congestive) heart failure: Secondary | ICD-10-CM

## 2018-12-12 LAB — LIPID PANEL
Cholesterol: 105 mg/dL (ref 0–200)
HDL: 31.5 mg/dL — ABNORMAL LOW (ref >39.00)
LDL Cholesterol: 50 mg/dL (ref 0–99)
NonHDL: 73.69
Total CHOL/HDL Ratio: 3
Triglycerides: 120 mg/dL (ref 0.0–149.0)
VLDL: 24 mg/dL (ref 0.0–40.0)

## 2018-12-12 LAB — HEMOGLOBIN A1C: Hgb A1c MFr Bld: 5.9 % (ref 4.6–6.5)

## 2018-12-12 NOTE — Patient Instructions (Signed)
GO TO THE LAB : Get the blood work     GO TO THE FRONT DESK Schedule your next appointment for a  checkup in 4-5 months   

## 2018-12-12 NOTE — Progress Notes (Signed)
Pre visit review using our clinic review tool, if applicable. No additional management support is needed unless otherwise documented below in the visit note. 

## 2018-12-12 NOTE — Progress Notes (Signed)
Subjective:    Patient ID: Bradley Sit., male    DOB: 12-28-1940, 78 y.o.   MRN: [id number]  DOS:  12/12/2018 Type of visit - description: Follow-up, here with his wife Since the last office visit he is doing well. Recently was in [place], 2 weeks ago developed diarrhea while there.  That is largely resolved.  He did have some stomach discomfort but that is also resolved. Good med compliance.    Review of Systems Denies lower extremity edema Shortness of breath at baseline Actually no cough or sputum. No lower extremity paresthesias Good compliance with oxygen at night, during the day he uses it as needed  Past Medical History:  Diagnosis Date  . Abnormal CT scan, chest 2012   CT chest, several lymphadenopathies. Sees pulmonary  . Anemia    intermittent  . Arthritis    "?back" (09/19/2018)  . Atrial fibrillation (HCC)    s/p AV node ablation & BiV PPM implantation 09/08/11 (op dictation pending)  . BPH (benign prostatic hyperplasia)    Saw Dr [name] 2004, normal renal u/s  . Bronchitis 08/26/2017  . CAD (coronary artery disease)   . CHF (congestive heart failure) (HCC)    Thought primarily to be non-systolic although EF down (EF 25-95% 12/2010, down to 35-40% 09/05/11), cath 2008 with no CAD, nuclear study 07/2011 showing Small area of reversibility in the distal ant/lat wall the left ventricle suspicious for ischemia/septal wall HK but felt to be low risk  (per D/C Summary 07/2011)  . Chronic bronchitis (HCC)    "get it ~ q yr"  . Heart murmur   . HLD (hyperlipidemia)   . HTN (hypertension)   . 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 Insomnia   . Migraines    "very very rare"  . On home oxygen therapy    "2L; 24/7" (09/19/2018)  . Pacemaker   . Peripheral vascular disease (HCC)    ??  . Pleural effusion 2008   S/p decortication  . Pulmonary HTN (HCC)    per cath 2008  . Type II diabetes mellitus (HCC) 1999    Past Surgical  History:  Procedure Laterality Date  . BI-VENTRICULAR PACEMAKER INSERTION Left 09/08/2011   Procedure: BI-VENTRICULAR PACEMAKER INSERTION (CRT-P);  Surgeon: Marinus Maw, MD;  Location[facility] C[facility] LAB;  Service: Cardiovascular;  Laterality: Left;  . CARDIAC CATHETERIZATION     "couple times; never had balloon or stent" (09/19/2018)  . CATARACT EXTRACTION W/ INTRAOCULAR LENS  IMPLANT, BILATERAL Bilateral 08/2018  . COLONOSCOPY  03/10/11   normal  . INSERT / REPLACE / REMOVE PACEMAKER  09/08/11   pacemaker placement  . LUNG DECORTICATION    . PLEURAL SCARIFICATION    . pneumothorax with fibrothorax  ~ 2010  . TONSILLECTOMY     "as a kid"     Social History   Socioeconomic History  . Marital status: Married    Spouse name: Bradley [name]  . Number of children: 1  . Years of education: Not on file  . Highest education level: Not on file  Occupational History  . Occupation: retired Publishing rights manager: RETIRED  Social Needs  . Financial resource strain: Not hard at all  . Food insecurity:    Worry: Never true    Inability: Never true  . Transportation needs:    Medical: No    Non-medical: No  Tobacco Use  . Smoking status: Former Smoker  Packs/day: 2.50    Years: 25.00    Pack years: 62.50    Types: Cigarettes, Cigars    Last attempt to quit: 10/11/1980    Years since quitting: 38.1  . Smokeless tobacco: Never Used  Substance and Sexual Activity  . Alcohol use: Yes    Comment: 09/19/2018 "maybe 6 pack of beer/year; socially"  . Drug use: No  . Sexual activity: Not Currently  Lifestyle  . Physical activity:    Days per week: 6 days    Minutes per session: 20 min  . Stress: Not at all  Relationships  . Social connections:    Talks on phone: More than three times a week    Gets together: More than three times a week    Attends religious service: More than 4 times per year    Active member of club or organization: No    Attends meetings of clubs or  organizations: Never    Relationship status: Married  . Intimate partner violence:    Fear of current or ex partner: Not on file    Emotionally abused: Not on file    Physically abused: Not on file    Forced sexual activity: Not on file  Other Topics Concern  . Not on file  Social History Narrative   Moved from [place] 2003.Marland Kitchen   He lives w/ his wife, Bradley [name]   1 son, substance abuse, passed away October 09, 2016            Allergies as of 12/12/2018      Reactions   Hydrocodone Other (See Comments)   "given to him in the hospital; went thru withdrawals once home; dr said not to take it again" (09/27/2012)   Tramadol Other (See Comments)   "given to him in the hospital; went thru withdrawals once home; dr said not to take it again" (09/27/2012)   Tadalafil Other (See Comments)   headache, backache   Avelox [moxifloxacin Hydrochloride] Itching, Other (See Comments)   Headache      Medication List       Accurate as of December 12, 2018 11:59 PM. Always use your most recent med list.        amLODipine 10 MG tablet Commonly known as:  NORVASC Take 1 tablet (10 mg total) by mouth daily.   aspirin 81 MG tablet Take 81 mg by mouth every morning.   atorvastatin 20 MG tablet Commonly known as:  LIPITOR Take 1 tablet (20 mg total) by mouth at bedtime.   calcium-vitamin D 500-200 MG-UNIT tablet Commonly known as:  OSCAL WITH D Take 1 tablet by mouth every morning.   carvedilol 25 MG tablet Commonly known as:  COREG Take 1 tablet (25 mg total) by mouth 2 (two) times daily with a meal.   furosemide 40 MG tablet Commonly known as:  LASIX Take 1 tablet (40 mg total) by mouth daily.   ipratropium-albuterol 0.5-2.5 (3) MG/3ML Soln Commonly known as:  DUONEB Take 3 mLs by nebulization 2 (two) times daily as needed.   losartan 100 MG tablet Commonly known as:  COZAAR Take 0.5 tablets (50 mg total) by mouth daily.   multivitamins ther. w/minerals Tabs tablet Take 1 tablet by mouth  daily.   nitroGLYCERIN 0.4 MG SL tablet Commonly known as:  NITROSTAT DISSOLVE 1 TABLET UNDER THE TONGUE EVERY 5 MINUTES AS NEEDED FOR CHEST PAIN   onetouch ultrasoft lancets Check blood sugars no more than twice daily   ONETOUCH VERIO test strip Generic  drug:  glucose blood CHECK BLOOD SUGAR NO MORE THAN TWICE A DAY   pantoprazole 40 MG tablet Commonly known as:  PROTONIX Take 1 tablet (40 mg total) by mouth every morning.   sitaGLIPtin 100 MG tablet Commonly known as:  JANUVIA Take 1 tablet (100 mg total) by mouth daily.   tamsulosin 0.4 MG Caps capsule Commonly known as:  FLOMAX Take 1 capsule (0.4 mg total) by mouth daily.   zolpidem 10 MG tablet Commonly known as:  AMBIEN Take 0.5 tablets (5 mg total) by mouth at bedtime as needed for sleep.           Objective:   Physical Exam BP 128/68 (BP Location: Left Arm, Patient Position: Sitting, Cuff Size: Small)   Pulse 89   Temp 98.5 F (36.9 C) (Oral)   Resp 16   Ht 5\' 9"  (1.753 m)   Wt 207 lb 2 oz (94 kg)   SpO2 92%   BMI 30.59 kg/m  General:   Well developed, NAD, BMI noted. HEENT:  Normocephalic . Face symmetric, atraumatic Lungs:  CTA B Normal respiratory effort, no intercostal retractions, no accessory muscle use. Heart: Seems regular today,  no murmur.  No pretibial edema bilaterally  Skin: Not pale. Not jaundice Neurologic:  alert & oriented X3.  Speech normal, gait appropriate for age and unassisted Psych--  Cognition and judgment appear intact.  Cooperative with normal attention span and concentration.  Behavior appropriate. No anxious or depressed appearing.      Assessment     Assessment  DM HTN Hyperlipidemia Anxiety- insomnia  BPH Cardiovascular:Dr Ladona Ridgel  --CAD -- CHF --Atrial fibrillation;  --Pacemaker --h/o IC bleeding: Not anticoagulated --Peripheral vascular disease? Pulmonary: Dr [name], last OV 10-18-2014, f/u prn --COPD, severe, chronic resp failure, night O2 prn, has  portable O2  --Pleural effusion s/p decortication --Pulmonary hypertension  Stasis dermatitis   PLAN: DM: Check A1c, continue Januvia High cholesterol: On Lipitor, check a FLP HTN: Well-controlled, last BMP satisfactory, continue amlodipine, carvedilol, Lasix, losartan. Insomnia: On Ambien, contract signed. CAD, CHF, atrial fibrillation: Saw cardiology 11/16/2018: Felt to be stable, no changes made. RTC 4 to 5 months

## 2018-12-13 NOTE — Assessment & Plan Note (Signed)
DM: Check A1c, continue Januvia High cholesterol: On Lipitor, check a FLP HTN: Well-controlled, last BMP satisfactory, continue amlodipine, carvedilol, Lasix, losartan. Insomnia: On Ambien, contract signed. CAD, CHF, atrial fibrillation: Saw cardiology 11/16/2018: Felt to be stable, no changes made. RTC 4 to 5 months

## 2018-12-21 ENCOUNTER — Inpatient Hospital Stay (HOSPITAL_COMMUNITY)
Admission: EM | Admit: 2018-12-21 | Discharge: 2018-12-26 | DRG: 190 | Disposition: A | Discharge status: Home / Self Care | Payer: Medicare Other | Attending: Internal Medicine | Admitting: Internal Medicine

## 2018-12-21 ENCOUNTER — Other Ambulatory Visit: Payer: Self-pay

## 2018-12-21 ENCOUNTER — Encounter (HOSPITAL_COMMUNITY): Payer: Self-pay

## 2018-12-21 ENCOUNTER — Emergency Department (HOSPITAL_COMMUNITY): Payer: Medicare Other

## 2018-12-21 DIAGNOSIS — Z87891 Personal history of nicotine dependence: Secondary | ICD-10-CM

## 2018-12-21 DIAGNOSIS — J9622 Acute and chronic respiratory failure with hypercapnia: Secondary | ICD-10-CM | POA: Diagnosis present

## 2018-12-21 DIAGNOSIS — I4891 Unspecified atrial fibrillation: Secondary | ICD-10-CM | POA: Diagnosis present

## 2018-12-21 DIAGNOSIS — Z9981 Dependence on supplemental oxygen: Secondary | ICD-10-CM

## 2018-12-21 DIAGNOSIS — Z8249 Family history of ischemic heart disease and other diseases of the circulatory system: Secondary | ICD-10-CM

## 2018-12-21 DIAGNOSIS — Z8701 Personal history of pneumonia (recurrent): Secondary | ICD-10-CM

## 2018-12-21 DIAGNOSIS — I4821 Permanent atrial fibrillation: Secondary | ICD-10-CM | POA: Diagnosis present

## 2018-12-21 DIAGNOSIS — Z7984 Long term (current) use of oral hypoglycemic drugs: Secondary | ICD-10-CM

## 2018-12-21 DIAGNOSIS — I10 Essential (primary) hypertension: Secondary | ICD-10-CM | POA: Diagnosis present

## 2018-12-21 DIAGNOSIS — I5032 Chronic diastolic (congestive) heart failure: Secondary | ICD-10-CM | POA: Diagnosis present

## 2018-12-21 DIAGNOSIS — Z7982 Long term (current) use of aspirin: Secondary | ICD-10-CM

## 2018-12-21 DIAGNOSIS — I110 Hypertensive heart disease with heart failure: Secondary | ICD-10-CM | POA: Diagnosis present

## 2018-12-21 DIAGNOSIS — I2510 Atherosclerotic heart disease of native coronary artery without angina pectoris: Secondary | ICD-10-CM | POA: Diagnosis present

## 2018-12-21 DIAGNOSIS — Z950 Presence of cardiac pacemaker: Secondary | ICD-10-CM

## 2018-12-21 DIAGNOSIS — I495 Sick sinus syndrome: Secondary | ICD-10-CM | POA: Diagnosis present

## 2018-12-21 DIAGNOSIS — Z833 Family history of diabetes mellitus: Secondary | ICD-10-CM

## 2018-12-21 DIAGNOSIS — J9621 Acute and chronic respiratory failure with hypoxia: Secondary | ICD-10-CM | POA: Diagnosis present

## 2018-12-21 DIAGNOSIS — I482 Chronic atrial fibrillation, unspecified: Secondary | ICD-10-CM | POA: Diagnosis present

## 2018-12-21 DIAGNOSIS — J209 Acute bronchitis, unspecified: Secondary | ICD-10-CM | POA: Diagnosis present

## 2018-12-21 DIAGNOSIS — J449 Chronic obstructive pulmonary disease, unspecified: Secondary | ICD-10-CM | POA: Diagnosis present

## 2018-12-21 DIAGNOSIS — E119 Type 2 diabetes mellitus without complications: Secondary | ICD-10-CM

## 2018-12-21 DIAGNOSIS — J441 Chronic obstructive pulmonary disease with (acute) exacerbation: Secondary | ICD-10-CM | POA: Diagnosis not present

## 2018-12-21 DIAGNOSIS — E785 Hyperlipidemia, unspecified: Secondary | ICD-10-CM | POA: Diagnosis present

## 2018-12-21 DIAGNOSIS — I428 Other cardiomyopathies: Secondary | ICD-10-CM

## 2018-12-21 DIAGNOSIS — I959 Hypotension, unspecified: Secondary | ICD-10-CM | POA: Diagnosis present

## 2018-12-21 DIAGNOSIS — J440 Chronic obstructive pulmonary disease with acute lower respiratory infection: Secondary | ICD-10-CM | POA: Diagnosis present

## 2018-12-21 DIAGNOSIS — I5033 Acute on chronic diastolic (congestive) heart failure: Secondary | ICD-10-CM | POA: Diagnosis present

## 2018-12-21 DIAGNOSIS — E1165 Type 2 diabetes mellitus with hyperglycemia: Secondary | ICD-10-CM | POA: Diagnosis present

## 2018-12-21 DIAGNOSIS — G4700 Insomnia, unspecified: Secondary | ICD-10-CM | POA: Diagnosis present

## 2018-12-21 LAB — CBC WITH DIFFERENTIAL/PLATELET
Abs Immature Granulocytes: 0.1 K/uL — ABNORMAL HIGH (ref 0.00–0.07)
Basophils Absolute: 0 K/uL (ref 0.0–0.1)
Basophils Relative: 0 %
Eosinophils Absolute: 0 K/uL (ref 0.0–0.5)
Eosinophils Relative: 0 %
HCT: 44.1 % (ref 39.0–52.0)
Hemoglobin: 14.3 g/dL (ref 13.0–17.0)
Immature Granulocytes: 1 %
Lymphocytes Relative: 11 %
Lymphs Abs: 1.6 K/uL (ref 0.7–4.0)
MCH: 30 pg (ref 26.0–34.0)
MCHC: 32.4 g/dL (ref 30.0–36.0)
MCV: 92.5 fL (ref 80.0–100.0)
Monocytes Absolute: 1 K/uL (ref 0.1–1.0)
Monocytes Relative: 7 %
Neutro Abs: 12.2 K/uL — ABNORMAL HIGH (ref 1.7–7.7)
Neutrophils Relative %: 81 %
Platelets: 189 K/uL (ref 150–400)
RBC: 4.77 MIL/uL (ref 4.22–5.81)
RDW: 13.2 % (ref 11.5–15.5)
WBC: 15 K/uL — ABNORMAL HIGH (ref 4.0–10.5)
nRBC: 0 % (ref 0.0–0.2)

## 2018-12-21 LAB — BASIC METABOLIC PANEL WITH GFR
Anion gap: 6 (ref 5–15)
BUN: 14 mg/dL (ref 8–23)
CO2: 27 mmol/L (ref 22–32)
Calcium: 8.4 mg/dL — ABNORMAL LOW (ref 8.9–10.3)
Chloride: 104 mmol/L (ref 98–111)
Creatinine, Ser: 0.96 mg/dL (ref 0.61–1.24)
GFR calc Af Amer: >60 mL/min
GFR calc non Af Amer: >60 mL/min
Glucose, Bld: 153 mg/dL — ABNORMAL HIGH (ref 70–99)
Potassium: 4.4 mmol/L (ref 3.5–5.1)
Sodium: 137 mmol/L (ref 135–145)

## 2018-12-21 LAB — POCT I-STAT 7, (LYTES, BLD GAS, ICA,H+H)
Acid-Base Excess: 1 mmol/L (ref 0.0–2.0)
Acid-Base Excess: 1 mmol/L (ref 0.0–2.0)
Bicarbonate: 25.9 mmol/L (ref 20.0–28.0)
Bicarbonate: 27.5 mmol/L (ref 20.0–28.0)
Calcium, Ion: 1.19 mmol/L (ref 1.15–1.40)
Calcium, Ion: 1.19 mmol/L (ref 1.15–1.40)
HCT: 40 % (ref 39.0–52.0)
HCT: 40 % (ref 39.0–52.0)
Hemoglobin: 13.6 g/dL (ref 13.0–17.0)
Hemoglobin: 13.6 g/dL (ref 13.0–17.0)
O2 Saturation: 83 %
O2 Saturation: 100 %
Patient temperature: 98.5
Patient temperature: 98.7
Potassium: 4.2 mmol/L (ref 3.5–5.1)
Potassium: 4.3 mmol/L (ref 3.5–5.1)
Sodium: 138 mmol/L (ref 135–145)
Sodium: 137 mmol/L (ref 135–145)
TCO2: 27 mmol/L (ref 22–32)
TCO2: 29 mmol/L (ref 22–32)
pCO2 arterial: 41.8 mmHg (ref 32.0–48.0)
pCO2 arterial: 48.8 mmHg — ABNORMAL HIGH (ref 32.0–48.0)
pH, Arterial: 7.399 (ref 7.350–7.450)
pH, Arterial: 7.359 (ref 7.350–7.450)
pO2, Arterial: 48 mmHg — ABNORMAL LOW (ref 83.0–108.0)
pO2, Arterial: 216 mmHg — ABNORMAL HIGH (ref 83.0–108.0)

## 2018-12-21 LAB — TROPONIN I: Troponin I: <0.03 ng/mL

## 2018-12-21 LAB — BRAIN NATRIURETIC PEPTIDE: B Natriuretic Peptide: 237.9 pg/mL — ABNORMAL HIGH (ref 0.0–100.0)

## 2018-12-21 MED ORDER — LORAZEPAM 2 MG/ML IJ SOLN
1.0000 mg | Freq: Once | INTRAMUSCULAR | Status: AC
  Administered 2018-12-21: 1 mg via INTRAVENOUS
  Filled 2018-12-21: qty 1

## 2018-12-21 MED ORDER — MAGNESIUM SULFATE 2 GM/50ML IV SOLN
2.0000 g | Freq: Once | INTRAVENOUS | Status: AC
  Administered 2018-12-21: 2 g via INTRAVENOUS
  Filled 2018-12-21: qty 50

## 2018-12-21 MED ORDER — METHYLPREDNISOLONE SODIUM SUCC 125 MG IJ SOLR
125.0000 mg | Freq: Once | INTRAMUSCULAR | Status: AC
  Administered 2018-12-21: 125 mg via INTRAVENOUS
  Filled 2018-12-21: qty 2

## 2018-12-21 MED ORDER — ALBUTEROL (5 MG/ML) CONTINUOUS INHALATION SOLN
10.0000 mg/h | INHALATION_SOLUTION | Freq: Once | RESPIRATORY_TRACT | Status: AC
  Administered 2018-12-21: 10 mg/h via RESPIRATORY_TRACT

## 2018-12-21 NOTE — ED Triage Notes (Signed)
Pt arrives with [facility] from home c/o increasing [facility] that has been "going on for the past few weeks" and has significantly increased over the past few days. Per EMS, pt is dyspneic on exertion and while laying flat. Pt states he uses 2L O2 during the day and 3-4L at bedtime. Pt has hx of CHF, COPD, HTN, and DM. Pt a&o x4, VSS at this time. Pt currently connected to high flow 10L by RT.  BP 123/76 HR 70's O2 sat 99% non NiV RR 18-20

## 2018-12-21 NOTE — Progress Notes (Signed)
Late note. RT obtained repeat ABG on pt with the following results. MD would like to wean pt off of BIPAP if possible. RT will continue to monitor.   Results for Bradley [name], Bradley [name] (MRN [id number]) as of 12/21/2018 22:19  Ref. Range 12/21/2018 21:43  Sample type Unknown ARTERIAL  pH, Arterial Latest Ref Range: 7.350 - 7.450  7.359  pCO2 arterial Latest Ref Range: 32.0 - 48.0 mmHg 48.8 (H)  pO2, Arterial Latest Ref Range: 83.0 - 108.0 mmHg 216.0 (H)  TCO2 Latest Ref Range: 22 - 32 mmol/L 29  Acid-Base Excess Latest Ref Range: 0.0 - 2.0 mmol/L 1.0  Bicarbonate Latest Ref Range: 20.0 - 28.0 mmol/L 27.5  O2 Saturation Latest Units: % 100.0  Patient temperature Unknown 98.7 F  Collection site Unknown RADIAL, ALLEN'S TEST ACCEPTABLE

## 2018-12-21 NOTE — ED Notes (Signed)
Samarion his grandson who lives in [place] wants a call back @ [phone], please call with a patient update grandmother incapable of driving. They would like info about pt status.

## 2018-12-21 NOTE — Progress Notes (Signed)
RT obtained ABG on pt with the following results. MD Effie Shy made aware of results. Pt placed on Servo NIV PS 15/5 and FIO2 100%. Repeat ABG in one hour. Pt tolerating well. RT will continue to monitor.   Results for JACOBEY, BURTNETT (MRN [id number]) as of 12/21/2018 20:34  Ref. Range 12/21/2018 20:22  Sample type Unknown ARTERIAL  pH, Arterial Latest Ref Range: 7.350 - 7.450  7.399  pCO2 arterial Latest Ref Range: 32.0 - 48.0 mmHg 41.8  pO2, Arterial Latest Ref Range: 83.0 - 108.0 mmHg 48.0 (L)  TCO2 Latest Ref Range: 22 - 32 mmol/L 27  Acid-Base Excess Latest Ref Range: 0.0 - 2.0 mmol/L 1.0  Bicarbonate Latest Ref Range: 20.0 - 28.0 mmol/L 25.9  O2 Saturation Latest Units: % 83.0  Patient temperature Unknown 98.5 F  Collection site Unknown RADIAL, ALLEN'S TEST ACCEPTABLE

## 2018-12-21 NOTE — ED Provider Notes (Signed)
[facility] EMERGENCY DEPARTMENT Provider Note   CSN: [id number] Arrival date & time: 12/21/18  1841    History   Chief Complaint No chief complaint on file.   HPI Bradley LOFLIN Sr. is a 78 y.o. male.     HPI  He presents for evaluation of shortness of breath transferred by EMS, on high flow oxygen.  Unclear what his oxygen in the field was.  Patient uses oxygen both day and night, usually higher flow at night.  He reports short of breath, started several weeks ago and has been worsening for several days.  He has dyspnea both when lying and sitting as well as on exertion. He denies fever, cough, weakness or dizziness.  There is been no nausea or vomiting.  He is using his usual medications.  There are no other known modifying factors.   Past Medical History:  Diagnosis Date  . Abnormal CT scan, chest 2012   CT chest, several lymphadenopathies. Sees pulmonary  . Anemia    intermittent  . Arthritis    "?back" (09/19/2018)  . Atrial fibrillation (HCC)    s/p AV node ablation & BiV PPM implantation 09/08/11 (op dictation pending)  . BPH (benign prostatic hyperplasia)    Saw Dr [name] 2004, normal renal u/s  . Bronchitis 08/26/2017  . CAD (coronary artery disease)   . CHF (congestive heart failure) (HCC)    Thought primarily to be non-systolic although EF down (EF 58-85% 12/2010, down to 35-40% 09/05/11), cath 2008 with no CAD, nuclear study 07/2011 showing Small area of reversibility in the distal ant/lat wall the left ventricle suspicious for ischemia/septal wall HK but felt to be low risk  (per D/C Summary 07/2011)  . Chronic bronchitis (HCC)    "get it ~ q yr"  . Heart murmur   . HLD (hyperlipidemia)   . HTN (hypertension)   . 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 Insomnia   . Migraines    "very very rare"  . On home oxygen therapy    "2L; 24/7" (09/19/2018)  . Pacemaker   . Peripheral vascular disease (HCC)    ??   . Pleural effusion 2008   S/p decortication  . Pulmonary HTN (HCC)    per cath 2008  . Type II diabetes mellitus (HCC) 1999    Patient Active Problem List   Diagnosis Date Noted  . PCP NOTES >>> 07/15/2015  . Acute bronchitis vs Upper Airway cough syndrome 09/07/2014  . CAD (coronary artery disease) of artery bypass graft 09/14/2013  . Annual physical exam 05/08/2012  . 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 Lymphadenopathy 09/23/2011  . Sinoatrial node dysfunction (HCC)   . 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 Chronic diastolic heart failure (HCC)   . PAD (peripheral artery disease) (HCC) 10/22/2010  . DERMATITIS, STASIS 09/14/2010  . Chronic respiratory failure (HCC) 09/14/2010  . Chronic pulmonary hypertension secondary to elevated L H pressures  04/15/2009  . ERECTILE DYSFUNCTION 01/06/2009  . Hyperlipidemia 12/13/2007  . Essential hypertension 12/13/2007  . Anxiety--insomnia 08/14/2007  . DM II (diabetes mellitus, type II), controlled (HCC) 01/02/2007  . Permanent atrial fibrillation 01/02/2007  . Benign enlargement of prostate 01/02/2007    Past Surgical History:  Procedure Laterality Date  . BI-VENTRICULAR PACEMAKER INSERTION Left 09/08/2011   Procedure: BI-VENTRICULAR PACEMAKER INSERTION (CRT-P);  Surgeon: Marinus Maw, MD;  Location: [facility] CATH LAB;  Service: Cardiovascular;  Laterality: Left;  . CARDIAC CATHETERIZATION     "  couple times; never had balloon or stent" (09/19/2018)  . CATARACT EXTRACTION W/ INTRAOCULAR LENS  IMPLANT, BILATERAL Bilateral 08/2018  . COLONOSCOPY  03/10/11   normal  . INSERT / REPLACE / REMOVE PACEMAKER  09/08/11   pacemaker placement  . LUNG DECORTICATION    . PLEURAL SCARIFICATION    . pneumothorax with fibrothorax  ~ 2010  . TONSILLECTOMY     "as a kid"         Home Medications    Prior to Admission medications   Medication Sig Start Date End Date Taking? Authorizing Provider  amLODipine (NORVASC) 10 MG tablet Take 1  tablet (10 mg total) by mouth daily. 10/30/18   [name], MD  aspirin 81 MG tablet Take 81 mg by mouth every morning.     [provider]  atorvastatin (LIPITOR) 20 MG tablet Take 1 tablet (20 mg total) by mouth at bedtime. 10/30/18   [name], MD  calcium-vitamin D (OSCAL WITH D) 500-200 MG-UNIT tablet Take 1 tablet by mouth every morning.    [provider]  carvedilol (COREG) 25 MG tablet Take 1 tablet (25 mg total) by mouth 2 (two) times daily with a meal. 12/04/18   [name], MD  furosemide (LASIX) 40 MG tablet Take 1 tablet (40 mg total) by mouth daily. 10/16/18 12/12/18  [name], MD  ipratropium-albuterol (DUONEB) 0.5-2.5 (3) MG/3ML SOLN Take 3 mLs by nebulization 2 (two) times daily as needed. 09/25/18   Darlin Drop, DO  Lancets [facility] ULTRASOFT) lancets Check blood sugars no more than twice daily 03/08/17   [name], MD  losartan (COZAAR) 100 MG tablet Take 0.5 tablets (50 mg total) by mouth daily. Patient taking differently: Take 50 mg by mouth at bedtime.  04/24/18   [name], MD  Multiple Vitamins-Minerals (MULTIVITAMINS THER. W/MINERALS) TABS tablet Take 1 tablet by mouth daily. 09/14/13   [name], MD  nitroGLYCERIN (NITROSTAT) 0.4 MG SL tablet DISSOLVE 1 TABLET UNDER THE TONGUE EVERY 5 MINUTES AS NEEDED FOR CHEST PAIN Patient not taking: No sig reported 07/14/16   Marinus Maw, MD  [facility] VERIO test strip CHECK BLOOD SUGAR NO MORE THAN TWICE A DAY 03/08/18   [name], MD  pantoprazole (PROTONIX) 40 MG tablet Take 1 tablet (40 mg total) by mouth every morning. 11/06/18   [name], MD  sitaGLIPtin (JANUVIA) 100 MG tablet Take 1 tablet (100 mg total) by mouth daily. 11/27/18   [name], MD  tamsulosin (FLOMAX) 0.4 MG CAPS capsule Take 1 capsule (0.4 mg total) by mouth daily. 06/19/18   [name], MD  zolpidem (AMBIEN) 10 MG tablet Take 0.5 tablets (5 mg total) by mouth at bedtime as needed for sleep. 09/25/18   Darlin Drop, DO    Family  History Family History  Problem Relation Age of Onset  . Diabetes Father   . Coronary artery disease Father   . Polycythemia Mother   . Drug abuse Son   . Breast cancer Maternal Aunt   . Prostate cancer Neg Hx   . Colon cancer Neg Hx     Social History Social History   Tobacco Use  . Smoking status: Former Smoker    Packs/day: 2.50    Years: 25.00    Pack years: 62.50    Types: Cigarettes, Cigars    Last attempt to quit: 10/11/1980    Years since quitting: 38.2  .  Smokeless tobacco: Never Used  Substance Use Topics  . Alcohol use: Yes    Comment: 1 can of beer ever 4-5 months  . Drug use: No     Allergies   Hydrocodone; Tramadol; Tadalafil; and Avelox [moxifloxacin hydrochloride]   Review of Systems Review of Systems  All other systems reviewed and are negative.    Physical Exam Updated Vital Signs BP (!) 119/94   Pulse 84   Resp (!) 22   Ht  (1.753 m)   Wt 103.4 kg   SpO2 100%   BMI 33.67 kg/m   Physical Exam Vitals signs and nursing note reviewed.  Constitutional:      General: He is not in acute distress.    Appearance: Normal appearance. He is well-developed and normal weight. He is not ill-appearing or diaphoretic.  HENT:     Head: Normocephalic and atraumatic.     Right Ear: External ear normal.     Left Ear: External ear normal.     Nose: No congestion or rhinorrhea.     Mouth/Throat:     Mouth: Mucous membranes are moist.     Pharynx: No oropharyngeal exudate.  Eyes:     Conjunctiva/sclera: Conjunctivae normal.     Pupils: Pupils are equal, round, and reactive to light.  Neck:     Musculoskeletal: Normal range of motion and neck supple.     Trachea: Phonation normal.  Cardiovascular:     Rate and Rhythm: Normal rate and regular rhythm.     Heart sounds: Normal heart sounds.  Pulmonary:     Effort: Pulmonary effort is normal.     Comments: Decreased air movement bilaterally with scattered rhonchi, no audible wheezing. Abdominal:      General: There is no distension.     Palpations: Abdomen is soft.     Tenderness: There is no abdominal tenderness.  Musculoskeletal: Normal range of motion.        General: No swelling, tenderness or signs of injury.  Skin:    General: Skin is warm and dry.  Neurological:     General: No focal deficit present.     Mental Status: He is alert and oriented to person, place, and time.     Cranial Nerves: No cranial nerve deficit.     Sensory: No sensory deficit.     Motor: No abnormal muscle tone.     Coordination: Coordination normal.  Psychiatric:        Mood and Affect: Mood normal.        Behavior: Behavior normal.        Thought Content: Thought content normal.        Judgment: Judgment normal.      ED Treatments / Results  Labs (all labs ordered are listed, but only abnormal results are displayed) Labs Reviewed  BASIC METABOLIC PANEL - Abnormal; Notable for the following components:      Result Value   Glucose, Bld 153 (*)    Calcium 8.4 (*)    All other components within normal limits  CBC WITH DIFFERENTIAL/PLATELET - Abnormal; Notable for the following components:   WBC 15.0 (*)    Neutro Abs 12.2 (*)    Abs Immature Granulocytes 0.10 (*)    All other components within normal limits  BRAIN NATRIURETIC PEPTIDE - Abnormal; Notable for the following components:   B Natriuretic Peptide 237.9 (*)    All other components within normal limits  POCT I-STAT 7, (LYTES, BLD GAS, ICA,H+H) -  Abnormal; Notable for the following components:   pO2, Arterial 48.0 (*)    All other components within normal limits  POCT I-STAT 7, (LYTES, BLD GAS, ICA,H+H) - Abnormal; Notable for the following components:   pCO2 arterial 48.8 (*)    pO2, Arterial 216.0 (*)    All other components within normal limits  TROPONIN I    EKG EKG Interpretation  Date/Time:  Thursday December 21 2018 18:44:15 EDT Ventricular Rate:  73 PR Interval:    QRS Duration: 137 QT Interval:  426 QTC  Calculation: 470 R Axis:   -103 Text Interpretation:  suspect V paced will repeat Confirmed by Mancel Bale [phone]) on 12/21/2018 6:51:46 PM   Radiology Dg Chest Port 1 View  Result Date: 12/21/2018 CLINICAL DATA:  78 year old male with shortness of breath. EXAM: PORTABLE CHEST 1 VIEW COMPARISON:  09/24/2018 and earlier. FINDINGS: Portable AP semi upright view at 1935 hours. Stable lung volumes and cardiomegaly. Other mediastinal contours are within normal limits. Visualized tracheal air column is within normal limits. Bilateral peripheral and basilar predominant pulmonary interstitial opacity appears stable. No pneumothorax, pleural effusion or acute pulmonary opacity. Negative visible bowel gas pattern. IMPRESSION: No acute cardiopulmonary abnormality. Stable cardiomegaly and chronic pulmonary interstitial changes. Electronically Signed   By: Odessa Fleming M.D.   On: 12/21/2018 19:49    Procedures .Critical Care Performed by: Mancel Bale, MD Authorized by: Mancel Bale, MD   Critical care provider statement:    Critical care time (minutes):  45   Critical care start time:  12/21/2018 6:50 PM   Critical care end time:  12/21/2018 11:38 PM   Critical care time was exclusive of:  Separately billable procedures and treating other patients   Critical care was necessary to treat or prevent imminent or life-threatening deterioration of the following conditions:  Respiratory failure   Critical care was time spent personally by me on the following activities:  Blood draw for specimens, development of treatment plan with patient or surrogate, discussions with consultants, evaluation of patient's response to treatment, examination of patient, obtaining history from patient or surrogate, ordering and performing treatments and interventions, ordering and review of laboratory studies, pulse oximetry, re-evaluation of patient's condition, review of old charts and ordering and review of radiographic studies    (including critical care time)  Medications Ordered in ED Medications  albuterol (PROVENTIL,VENTOLIN) solution continuous neb (10 mg/hr Nebulization Given 12/21/18 1939)  magnesium sulfate IVPB 2 g 50 mL (0 g Intravenous Stopped 12/21/18 2200)  LORazepam (ATIVAN) injection 1 mg (1 mg Intravenous Given 12/21/18 2213)  methylPREDNISolone sodium succinate (SOLU-MEDROL) 125 mg/2 mL injection 125 mg (125 mg Intravenous Given 12/21/18 2355)     Initial Impression / Assessment and Plan / ED Course  I have reviewed the triage vital signs and the nursing notes.  Pertinent labs & imaging results that were available during my care of the patient were reviewed by me and considered in my medical decision making (see chart for details).  Clinical Course as of Dec 21 21  Thu Dec 21, 2018  2327 Normal except elevated PCO2, and O2.  I-STAT 7, (LYTES, BLD GAS, ICA, H+H)(!) [EW]  2328 Normal except elevated white count  CBC with Differential(!) [EW]  2329 Normal  Troponin I - Once [EW]  2329 Normal except elevated glucose and low calcium  Basic metabolic panel(!) [EW]  2329 Motivation  Brain natriuretic peptide(!) [EW]  2329 Chronic interstitial changes, image reviewed by me.  DG Chest Port 1 View [  EW]    Clinical Course User Index [EW] Mancel Bale, MD        Patient Vitals for the past 24 hrs:  BP Pulse Resp SpO2 Height Weight  12/21/18 2150 - - - 100 % - -  12/21/18 2045 (!) 119/94 84 (!) 22 (!) 87 % - -  12/21/18 2030 130/67 79 (!) 24 100 % - -  12/21/18 2017 - 73 (!) 33 (!) 88 % - -  12/21/18 1945 139/85 71 (!) 31 (!) 87 % - -  12/21/18 1942 (!) 90/41 72 (!) 31 (!) 88 % - -  12/21/18 1851 - - - - 5\' 9"  (1.753 m) 103.4 kg  12/21/18 1845 132/70 71 (!) 27 (!) 89 % - -    11:27 PM Reevaluation with update and discussion. After initial assessment and treatment, an updated evaluation reveals at this time he is resting comfortably.  Vital signs are reassuring. Mancel Bale   Medical  Decision Making: Presents with respiratory distress, rapid heartbeat and uncomfortable with proxy, high flow oxygen.  Patient converted to BiPAP, after blood gas indicating hypercapnic Respiratory failure.  Patient requires hospitalization, and stepdown unit.  Doubt pneumonia, ACS or PE.  CRITICAL CARE-yes Performed by: Mancel Bale  Nursing Notes Reviewed/ Care Coordinated Applicable Imaging Reviewed Interpretation of Laboratory Data incorporated into ED treatment  12:03 AM-Consult complete with hospitalist. Patient case explained and discussed.  He agrees to admit patient for further evaluation and treatment. Call ended at 12:16 AM  Plan: Admit   Final Clinical Impressions(s) / ED Diagnoses   Final diagnoses:  Acute on chronic respiratory failure with hypercapnia [facility])    ED Discharge Orders    None       Mancel Bale, MD 12/22/18 0023

## 2018-12-21 NOTE — ED Notes (Signed)
Pt placed back on Bipap 

## 2018-12-21 NOTE — ED Notes (Signed)
Pt resting quietly at this time,  Bi-Pap remains on pt.

## 2018-12-21 NOTE — Progress Notes (Signed)
Pt arrived by EMS on cpap (20 minutes total). Pt reports he feels much better now so he was placed on 5L [place], eventually increased to 10L HFNC with spo2 88-90%. Pt talking a lot and spo2 increases when not talking. States he wears 2L at home during the day and 3-4L at night. Pt with slight increased WOB. Bipap at bedside in case he needs it.

## 2018-12-22 ENCOUNTER — Encounter: Payer: Self-pay | Admitting: Internal Medicine

## 2018-12-22 ENCOUNTER — Encounter (HOSPITAL_COMMUNITY): Payer: Self-pay | Admitting: Family Medicine

## 2018-12-22 DIAGNOSIS — Z87891 Personal history of nicotine dependence: Secondary | ICD-10-CM | POA: Diagnosis not present

## 2018-12-22 DIAGNOSIS — I495 Sick sinus syndrome: Secondary | ICD-10-CM | POA: Diagnosis present

## 2018-12-22 DIAGNOSIS — Z833 Family history of diabetes mellitus: Secondary | ICD-10-CM | POA: Diagnosis not present

## 2018-12-22 DIAGNOSIS — J449 Chronic obstructive pulmonary disease, unspecified: Secondary | ICD-10-CM | POA: Diagnosis present

## 2018-12-22 DIAGNOSIS — Z7982 Long term (current) use of aspirin: Secondary | ICD-10-CM | POA: Diagnosis not present

## 2018-12-22 DIAGNOSIS — E785 Hyperlipidemia, unspecified: Secondary | ICD-10-CM | POA: Diagnosis present

## 2018-12-22 DIAGNOSIS — J9621 Acute and chronic respiratory failure with hypoxia: Secondary | ICD-10-CM | POA: Diagnosis not present

## 2018-12-22 DIAGNOSIS — J441 Chronic obstructive pulmonary disease with (acute) exacerbation: Secondary | ICD-10-CM | POA: Diagnosis not present

## 2018-12-22 DIAGNOSIS — I110 Hypertensive heart disease with heart failure: Secondary | ICD-10-CM | POA: Diagnosis not present

## 2018-12-22 DIAGNOSIS — I428 Other cardiomyopathies: Secondary | ICD-10-CM | POA: Diagnosis not present

## 2018-12-22 DIAGNOSIS — J9622 Acute and chronic respiratory failure with hypercapnia: Secondary | ICD-10-CM | POA: Diagnosis not present

## 2018-12-22 DIAGNOSIS — I4821 Permanent atrial fibrillation: Secondary | ICD-10-CM

## 2018-12-22 DIAGNOSIS — E1159 Type 2 diabetes mellitus with other circulatory complications: Secondary | ICD-10-CM

## 2018-12-22 DIAGNOSIS — I959 Hypotension, unspecified: Secondary | ICD-10-CM | POA: Diagnosis present

## 2018-12-22 DIAGNOSIS — J209 Acute bronchitis, unspecified: Secondary | ICD-10-CM | POA: Diagnosis not present

## 2018-12-22 DIAGNOSIS — J440 Chronic obstructive pulmonary disease with acute lower respiratory infection: Secondary | ICD-10-CM | POA: Diagnosis not present

## 2018-12-22 DIAGNOSIS — I5032 Chronic diastolic (congestive) heart failure: Secondary | ICD-10-CM | POA: Diagnosis not present

## 2018-12-22 DIAGNOSIS — Z8249 Family history of ischemic heart disease and other diseases of the circulatory system: Secondary | ICD-10-CM | POA: Diagnosis not present

## 2018-12-22 DIAGNOSIS — E1165 Type 2 diabetes mellitus with hyperglycemia: Secondary | ICD-10-CM | POA: Diagnosis not present

## 2018-12-22 DIAGNOSIS — Z950 Presence of cardiac pacemaker: Secondary | ICD-10-CM | POA: Diagnosis not present

## 2018-12-22 DIAGNOSIS — Z8701 Personal history of pneumonia (recurrent): Secondary | ICD-10-CM | POA: Diagnosis not present

## 2018-12-22 DIAGNOSIS — G4700 Insomnia, unspecified: Secondary | ICD-10-CM | POA: Diagnosis not present

## 2018-12-22 DIAGNOSIS — Z7984 Long term (current) use of oral hypoglycemic drugs: Secondary | ICD-10-CM | POA: Diagnosis not present

## 2018-12-22 DIAGNOSIS — Z9981 Dependence on supplemental oxygen: Secondary | ICD-10-CM | POA: Diagnosis not present

## 2018-12-22 DIAGNOSIS — I2510 Atherosclerotic heart disease of native coronary artery without angina pectoris: Secondary | ICD-10-CM | POA: Diagnosis present

## 2018-12-22 LAB — CBC WITH DIFFERENTIAL/PLATELET
Band Neutrophils: 0 %
Basophils Absolute: 0 10*3/uL (ref 0.0–0.1)
Basophils Relative: 0 %
Blasts: 0 %
Eosinophils Absolute: 0 10*3/uL (ref 0.0–0.5)
Eosinophils Relative: 0 %
HCT: 40.8 % (ref 39.0–52.0)
Hemoglobin: 13.7 g/dL (ref 13.0–17.0)
Lymphocytes Relative: 5 %
Lymphs Abs: 0.7 10*3/uL (ref 0.7–4.0)
MCH: 30.8 pg (ref 26.0–34.0)
MCHC: 33.6 g/dL (ref 30.0–36.0)
MCV: 91.7 fL (ref 80.0–100.0)
Metamyelocytes Relative: 0 %
Monocytes Absolute: 0.4 10*3/uL (ref 0.1–1.0)
Monocytes Relative: 3 %
Myelocytes: 0 %
Neutro Abs: 12 10*3/uL — ABNORMAL HIGH (ref 1.7–7.7)
Neutrophils Relative %: 92 %
Other: 0 %
Platelets: 170 10*3/uL (ref 150–400)
Promyelocytes Relative: 0 %
RBC: 4.45 MIL/uL (ref 4.22–5.81)
RDW: 13.2 % (ref 11.5–15.5)
WBC: 13.1 10*3/uL — ABNORMAL HIGH (ref 4.0–10.5)
nRBC: 0 % (ref 0.0–0.2)
nRBC: 0 /100 WBC

## 2018-12-22 LAB — GLUCOSE, CAPILLARY
Glucose-Capillary: 217 mg/dL — ABNORMAL HIGH (ref 70–99)
Glucose-Capillary: 196 mg/dL — ABNORMAL HIGH (ref 70–99)
Glucose-Capillary: 195 mg/dL — ABNORMAL HIGH (ref 70–99)
Glucose-Capillary: 189 mg/dL — ABNORMAL HIGH (ref 70–99)

## 2018-12-22 LAB — RESPIRATORY PANEL BY PCR

## 2018-12-22 LAB — BASIC METABOLIC PANEL
Anion gap: 6 (ref 5–15)
BUN: 14 mg/dL (ref 8–23)
CO2: 25 mmol/L (ref 22–32)
Calcium: 8.4 mg/dL — ABNORMAL LOW (ref 8.9–10.3)
Chloride: 106 mmol/L (ref 98–111)
Creatinine, Ser: 0.91 mg/dL (ref 0.61–1.24)
GFR calc Af Amer: >60 mL/min (ref >60)
GFR calc non Af Amer: >60 mL/min (ref >60)
Glucose, Bld: 130 mg/dL — ABNORMAL HIGH (ref 70–99)
Potassium: 4.3 mmol/L (ref 3.5–5.1)
Sodium: 137 mmol/L (ref 135–145)

## 2018-12-22 LAB — MRSA PCR SCREENING: MRSA by PCR: NEGATIVE

## 2018-12-22 MED ORDER — BENZONATATE 100 MG PO CAPS
100.0000 mg | ORAL_CAPSULE | Freq: Three times a day (TID) | ORAL | Status: DC
  Administered 2018-12-22 – 2018-12-26 (×13): 100 mg via ORAL
  Filled 2018-12-22 (×13): qty 1

## 2018-12-22 MED ORDER — DM-GUAIFENESIN ER 30-600 MG PO TB12
1.0000 | ORAL_TABLET | Freq: Two times a day (BID) | ORAL | Status: DC
  Administered 2018-12-22 – 2018-12-26 (×9): 1 via ORAL
  Filled 2018-12-22 (×9): qty 1

## 2018-12-22 MED ORDER — SODIUM CHLORIDE 0.9% FLUSH: 3.0000 mL | INTRAVENOUS | Status: DC | PRN

## 2018-12-22 MED ORDER — ENOXAPARIN SODIUM 40 MG/0.4ML ~~LOC~~ SOLN
40.0000 mg | Freq: Every day | SUBCUTANEOUS | Status: DC
  Administered 2018-12-22 – 2018-12-26 (×5): 40 mg via SUBCUTANEOUS
  Filled 2018-12-22 (×5): qty 0.4

## 2018-12-22 MED ORDER — FUROSEMIDE 40 MG PO TABS
40.0000 mg | ORAL_TABLET | Freq: Every day | ORAL | Status: DC
  Administered 2018-12-22 – 2018-12-26 (×5): 40 mg via ORAL
  Filled 2018-12-22 (×6): qty 1

## 2018-12-22 MED ORDER — IPRATROPIUM-ALBUTEROL 0.5-2.5 (3) MG/3ML IN SOLN
3.0000 mL | Freq: Three times a day (TID) | RESPIRATORY_TRACT | Status: DC
  Administered 2018-12-22 – 2018-12-26 (×12): 3 mL via RESPIRATORY_TRACT
  Filled 2018-12-22 (×12): qty 3

## 2018-12-22 MED ORDER — SODIUM CHLORIDE 0.9 % IV SOLN
1.0000 g | Freq: Every day | INTRAVENOUS | Status: DC
  Administered 2018-12-22 – 2018-12-26 (×5): 1 g via INTRAVENOUS
  Filled 2018-12-22 (×5): qty 10

## 2018-12-22 MED ORDER — SODIUM CHLORIDE 0.9% FLUSH
3.0000 mL | Freq: Two times a day (BID) | INTRAVENOUS | Status: DC
  Administered 2018-12-22 – 2018-12-26 (×8): 3 mL via INTRAVENOUS

## 2018-12-22 MED ORDER — ALBUTEROL SULFATE (2.5 MG/3ML) 0.083% IN NEBU: 2.5000 mg | INHALATION_SOLUTION | RESPIRATORY_TRACT | Status: DC | PRN

## 2018-12-22 MED ORDER — ONDANSETRON HCL 4 MG PO TABS: 4.0000 mg | ORAL_TABLET | Freq: Four times a day (QID) | ORAL | Status: DC | PRN

## 2018-12-22 MED ORDER — SODIUM CHLORIDE 0.9 % IV SOLN
250.0000 mL | INTRAVENOUS | Status: DC | PRN
  Administered 2018-12-22: 250 mL via INTRAVENOUS

## 2018-12-22 MED ORDER — SODIUM CHLORIDE 0.9 % IV SOLN
500.0000 mg | Freq: Every day | INTRAVENOUS | Status: DC
  Administered 2018-12-22 (×2): 500 mg via INTRAVENOUS
  Filled 2018-12-22 (×2): qty 500

## 2018-12-22 MED ORDER — ONDANSETRON HCL 4 MG/2ML IJ SOLN: 4.0000 mg | Freq: Four times a day (QID) | INTRAMUSCULAR | Status: DC | PRN

## 2018-12-22 MED ORDER — INSULIN ASPART 100 UNIT/ML ~~LOC~~ SOLN
0.0000 [IU] | Freq: Every day | SUBCUTANEOUS | Status: DC
  Administered 2018-12-23 – 2018-12-24 (×2): 2 [IU] via SUBCUTANEOUS
  Administered 2018-12-25: 4 [IU] via SUBCUTANEOUS

## 2018-12-22 MED ORDER — METHYLPREDNISOLONE SODIUM SUCC 125 MG IJ SOLR
60.0000 mg | Freq: Four times a day (QID) | INTRAMUSCULAR | Status: DC
  Administered 2018-12-22 – 2018-12-24 (×10): 60 mg via INTRAVENOUS
  Filled 2018-12-22 (×10): qty 2

## 2018-12-22 MED ORDER — IPRATROPIUM-ALBUTEROL 0.5-2.5 (3) MG/3ML IN SOLN: 3.0000 mL | Freq: Two times a day (BID) | RESPIRATORY_TRACT | Status: DC

## 2018-12-22 MED ORDER — SODIUM CHLORIDE 0.9% FLUSH
3.0000 mL | Freq: Two times a day (BID) | INTRAVENOUS | Status: DC
  Administered 2018-12-22 – 2018-12-26 (×8): 3 mL via INTRAVENOUS

## 2018-12-22 MED ORDER — INSULIN ASPART 100 UNIT/ML ~~LOC~~ SOLN
0.0000 [IU] | Freq: Three times a day (TID) | SUBCUTANEOUS | Status: DC
  Administered 2018-12-22 (×2): 2 [IU] via SUBCUTANEOUS
  Administered 2018-12-22 – 2018-12-23 (×3): 3 [IU] via SUBCUTANEOUS
  Administered 2018-12-23 – 2018-12-24 (×3): 5 [IU] via SUBCUTANEOUS
  Administered 2018-12-24: 3 [IU] via SUBCUTANEOUS
  Administered 2018-12-25 (×3): 2 [IU] via SUBCUTANEOUS
  Administered 2018-12-26: 3 [IU] via SUBCUTANEOUS

## 2018-12-22 MED ORDER — ACETAMINOPHEN 650 MG RE SUPP: 650.0000 mg | Freq: Four times a day (QID) | RECTAL | Status: DC | PRN

## 2018-12-22 MED ORDER — ACETAMINOPHEN 325 MG PO TABS: 650.0000 mg | ORAL_TABLET | Freq: Four times a day (QID) | ORAL | Status: DC | PRN

## 2018-12-22 MED ORDER — IPRATROPIUM-ALBUTEROL 0.5-2.5 (3) MG/3ML IN SOLN
3.0000 mL | Freq: Four times a day (QID) | RESPIRATORY_TRACT | Status: DC
  Administered 2018-12-22: 3 mL via RESPIRATORY_TRACT
  Filled 2018-12-22: qty 3

## 2018-12-22 NOTE — Progress Notes (Signed)
[facility] PLAN OF CARE NOTE Patient: Bradley [name] GHW:[id number]   PCP: [name], MD DOB: May 25, 1941   DOA: 12/21/2018   DOS: 12/22/2018    Patient was admitted by my colleague Dr. Antionette Char earlier on 12/22/2018. I have reviewed the H&P as well as assessment and plan and agree with the same. Important changes in the plan are listed below.  Plan of care: Principal Problem:   COPD with acute exacerbation (HCC) Active Problems:   DM II (diabetes mellitus, type II), controlled (HCC)   Essential hypertension   Permanent atrial fibrillation   Chronic diastolic heart failure (HCC)   Non-ischemic cardiomyopathy (HCC)   Sinoatrial node dysfunction (HCC)   Acute on chronic respiratory failure with hypoxia (HCC)   COPD (chronic obstructive pulmonary disease) (HCC) Acute hypoxic respiratory failure. Acute COPD exacerbation. Likely secondary to acute bronchitis probably viral. Presents with complaints of cough and shortness of breath but uses 2 L of oxygen at home. Currently requiring high flow nasal cannula with 10 L. Overnight required BiPAP. Still in significant distress.  Has bronchospasm as well as tachypnea. Bilateral wheezing exam. We will continue with IV Solu-Medrol, increase duo nebs from acute 12 hours to every 6 hours.  Add supportive measures for cough.  Add ceftriaxone along with azithromycin.  Check respiratory virus pathogen panel.  Severity of Illness: The appropriate patient status for this patient is INPATIENT. Inpatient status is judged to be reasonable and necessary in order to provide the required intensity of service to ensure the patient's safety. The patient's presenting symptoms, physical exam findings, and initial radiographic and laboratory data in the context of their chronic comorbidities is felt to place them at high risk for further clinical deterioration. Furthermore, it is not anticipated that the patient will be medically stable for discharge from the  hospital within 2 midnights of admission. The following factors support the patient status of inpatient.   " The patient's presenting symptoms include shortness of breath and hypoxia. " The worrisome physical exam findings include tachypnea, bronchospasm, bilateral expiratory wheezing, needing 8 L of oxygen. " The initial radiographic and laboratory data are worrisome because of ABG showing PO2 48 on admission.  Leukocytosis. " The chronic co-morbidities include CHF, A. fib.   * I certify that at the point of admission it is my clinical judgment that the patient will require inpatient hospital care spanning beyond 2 midnights from the point of admission due to high intensity of service, high risk for further deterioration and high frequency of surveillance required.*    Author: Lynden Oxford, MD [facility] 12/22/2018 10:40 AM   If 7PM-7AM, please contact night-coverage at [email or web address]

## 2018-12-22 NOTE — Plan of Care (Signed)
  Problem: Clinical Measurements: Goal: Ability to maintain clinical measurements within normal limits will improve Outcome: Progressing Goal: Will remain free from infection Outcome: Progressing   Problem: Nutrition: Goal: Adequate nutrition will be maintained Outcome: Progressing   Problem: Safety: Goal: Ability to remain free from injury will improve Outcome: Progressing   Problem: Skin Integrity: Goal: Risk for impaired skin integrity will decrease Outcome: Progressing   

## 2018-12-22 NOTE — H&P (Signed)
History and Physical    Bradley [name] JSR:[id number] DOB: [birth year] DOA: 12/21/2018  PCP: Bradley Plump, MD   Patient coming from: Home   Chief Complaint: SOB   HPI: Bradley [name] Sr. is a 78 y.o. male with medical history significant for atrial fibrillation not on anticoagulation, sinoatrial node dysfunction with pacer, chronic diastolic CHF, COPD with chronic hypoxic respiratory failure, and type 2 diabetes mellitus, now presenting to the emergency department for evaluation of shortness of breath.  Patient reports a few weeks of progressive dyspnea, worsening significantly over the past couple days.  He denies any fevers, chills, lower extremity swelling, or leg tenderness.  He denies chest pain.  Reports increased cough, feels as though he needs to cough something up but has not been able to.  He reports improvement with his breathing treatments at home, but quickly develops dyspnea again.  ED Course: Upon arrival to the ED, patient is found to be saturating upper 80s on BiPAP, tachypneic in the 30s, and with blood pressure 90/40.  EKG features and undetermined rhythm with nonspecific IVCD.  Chest x-ray is negative for acute cardiopulmonary disease.  Troponin is undetectable and BNP elevated 238.  Chemistry panel is unremarkable and CBC notable for leukocytosis to 15,000.  Patient was placed on BiPAP in the ED, treated with continuous albuterol neb, IV Ativan, IV magnesium, and 125 mg of IV Solu-Medrol.  He reports improvement with this, but remains on BiPAP and dyspneic at rest.  He will be observed for ongoing evaluation and management.  Review of Systems:  All other systems reviewed and apart from HPI, are negative.  Past Medical History:  Diagnosis Date  . Abnormal CT scan, chest 2012   CT chest, several lymphadenopathies. Sees pulmonary  . Anemia    intermittent  . Arthritis    "?back" (09/19/2018)  . Atrial fibrillation (HCC)    s/p AV node ablation & BiV PPM implantation  09/08/11 (op dictation pending)  . BPH (benign prostatic hyperplasia)    Saw Dr Bradley [name] 2004, normal renal u/s  . Bronchitis 08/26/2017  . CAD (coronary artery disease)   . CHF (congestive heart failure) (HCC)    Thought primarily to be non-systolic although EF down (EF 92-44% 12/2010, down to 35-40% 09/05/11), cath 2008 with no CAD, nuclear study 07/2011 showing Small area of reversibility in the distal ant/lat wall the left ventricle suspicious for ischemia/septal wall HK but felt to be low risk  (per D/C Summary 07/2011)  . Chronic bronchitis (HCC)    "get it ~ q yr"  . Heart murmur   . HLD (hyperlipidemia)   . HTN (hypertension)   . 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 Insomnia   . Migraines    "very very rare"  . On home oxygen therapy    "2L; 24/7" (09/19/2018)  . Pacemaker   . Peripheral vascular disease (HCC)    ??  . Pleural effusion 2008   S/p decortication  . Pulmonary HTN (HCC)    per cath 2008  . Type II diabetes mellitus (HCC) 1999    Past Surgical History:  Procedure Laterality Date  . BI-VENTRICULAR PACEMAKER INSERTION Left 09/08/2011   Procedure: BI-VENTRICULAR PACEMAKER INSERTION (CRT-P);  Surgeon: Bradley Maw, MD;  Location: [facility] CATH LAB;  Service: Cardiovascular;  Laterality: Left;  . CARDIAC CATHETERIZATION     "couple times; never had balloon or stent" (09/19/2018)  . CATARACT EXTRACTION W/ INTRAOCULAR LENS  IMPLANT,  BILATERAL Bilateral 08/2018  . COLONOSCOPY  03/10/11   normal  . INSERT / REPLACE / REMOVE PACEMAKER  09/08/11   pacemaker placement  . LUNG DECORTICATION    . PLEURAL SCARIFICATION    . pneumothorax with fibrothorax  ~ 2010  . TONSILLECTOMY     "as a kid"      reports that he quit smoking about 38 years ago. His smoking use included cigarettes and cigars. He has a 62.50 pack-year smoking history. He has never used smokeless tobacco. He reports current alcohol use. He reports that he does not use  drugs.  Allergies  Allergen Reactions  . Hydrocodone Other (See Comments)    "given to him in the hospital; went thru withdrawals once home; dr said not to take it again" (09/27/2012)  . Tramadol Other (See Comments)    "given to him in the hospital; went thru withdrawals once home; dr said not to take it again" (09/27/2012)  . Tadalafil Other (See Comments)    headache, backache  . Avelox [Moxifloxacin Hydrochloride] Itching and Other (See Comments)    Headache    Family History  Problem Relation Age of Onset  . Diabetes Father   . Coronary artery disease Father   . Polycythemia Mother   . Drug abuse Son   . Breast cancer Maternal Aunt   . Prostate cancer Neg Hx   . Colon cancer Neg Hx      Prior to Admission medications   Medication Sig Start Date End Date Taking? Authorizing Provider  amLODipine (NORVASC) 10 MG tablet Take 1 tablet (10 mg total) by mouth daily. 10/30/18   Bradley Plump, MD  aspirin 81 MG tablet Take 81 mg by mouth every morning.     [provider]  atorvastatin (LIPITOR) 20 MG tablet Take 1 tablet (20 mg total) by mouth at bedtime. 10/30/18   Bradley Plump, MD  calcium-vitamin D (OSCAL WITH D) 500-200 MG-UNIT tablet Take 1 tablet by mouth every morning.    [provider]  carvedilol (COREG) 25 MG tablet Take 1 tablet (25 mg total) by mouth 2 (two) times daily with a meal. 12/04/18   Bradley Plump, MD  furosemide (LASIX) 40 MG tablet Take 1 tablet (40 mg total) by mouth daily. 10/16/18 12/12/18  Bradley Plump, MD  ipratropium-albuterol (DUONEB) 0.5-2.5 (3) MG/3ML SOLN Take 3 mLs by nebulization 2 (two) times daily as needed. 09/25/18   Darlin Drop, DO  Lancets [facility] ULTRASOFT) lancets Check blood sugars no more than twice daily 03/08/17   Bradley Plump, MD  losartan (COZAAR) 100 MG tablet Take 0.5 tablets (50 mg total) by mouth daily. Patient taking differently: Take 50 mg by mouth at bedtime.  04/24/18   Bradley Plump, MD  Multiple Vitamins-Minerals  (MULTIVITAMINS THER. W/MINERALS) TABS tablet Take 1 tablet by mouth daily. 09/14/13   Bradley Plump, MD  nitroGLYCERIN (NITROSTAT) 0.4 MG SL tablet DISSOLVE 1 TABLET UNDER THE TONGUE EVERY 5 MINUTES AS NEEDED FOR CHEST PAIN Patient not taking: No sig reported 07/14/16   Bradley Maw, MD  [facility] VERIO test strip CHECK BLOOD SUGAR NO MORE THAN TWICE A DAY 03/08/18   Bradley Plump, MD  pantoprazole (PROTONIX) 40 MG tablet Take 1 tablet (40 mg total) by mouth every morning. 11/06/18   Bradley Plump, MD  sitaGLIPtin (JANUVIA) 100 MG tablet Take 1 tablet (100 mg total) by mouth daily. 11/27/18   Bradley Plump, MD  tamsulosin (FLOMAX) 0.4 MG CAPS capsule Take 1 capsule (0.4 mg total) by mouth daily. 06/19/18   Bradley Plump, MD  zolpidem (AMBIEN) 10 MG tablet Take 0.5 tablets (5 mg total) by mouth at bedtime as needed for sleep. 09/25/18   Darlin Drop, DO    Physical Exam: Vitals:   12/21/18 2017 12/21/18 2030 12/21/18 2045 12/21/18 2150  BP:  130/67 (!) 119/94   Pulse: 73 79 84   Resp: (!) 33 (!) 24 (!) 22   SpO2: (!) 88% 100% (!) 87% 100%  Weight:      Height:        Constitutional: No pallor, no diaphoresis  Eyes: PERTLA, lids and conjunctivae normal ENMT: Mucous membranes are moist. Posterior pharynx clear of any exudate or lesions.   Neck: normal, supple, no masses, no thyromegaly Respiratory: Diminished breath sounds with prolonged expiratory phase. Occasional wheeze. Mild tachypnea. No pallor or cyanosis.  Cardiovascular: S1 & S2 heard, regular rate and rhythm. No extremity edema.   Abdomen: No distension, no tenderness, soft. Bowel sounds normal.  Musculoskeletal: no clubbing / cyanosis. No joint deformity upper and lower extremities.   Skin: no significant rashes, lesions, ulcers. Warm, dry, well-perfused. Neurologic: No facial asymmetry. Sensation intact. Moving all extremities.    Psychiatric: Alert and oriented to person, place, and situation. Calm, cooperative.    Labs on Admission: I  have personally reviewed following labs and imaging studies  CBC: Recent Labs  Lab 12/21/18 2022 12/21/18 2030 12/21/18 2143  WBC  --  15.0*  --   NEUTROABS  --  12.2*  --   HGB 13.6 14.3 13.6  HCT 40.0 44.1 40.0  MCV  --  92.5  --   PLT  --  189  --    Basic Metabolic Panel: Recent Labs  Lab 12/21/18 2022 12/21/18 2030 12/21/18 2143  NA 138 137 137  K 4.2 4.4 4.3  CL  --  104  --   CO2  --  27  --   GLUCOSE  --  153*  --   BUN  --  14  --   CREATININE  --  0.96  --   CALCIUM  --  8.4*  --    GFR: Estimated Creatinine Clearance: 76.4 mL/min (by C-G formula based on SCr of 0.96 mg/dL). Liver Function Tests: No results for input(s): AST, ALT, ALKPHOS, BILITOT, PROT, ALBUMIN in the last 168 hours. No results for input(s): LIPASE, AMYLASE in the last 168 hours. No results for input(s): AMMONIA in the last 168 hours. Coagulation Profile: No results for input(s): INR, PROTIME in the last 168 hours. Cardiac Enzymes: Recent Labs  Lab 12/21/18 2030  TROPONINI <0.03   BNP (last 3 results) No results for input(s): PROBNP in the last 8760 hours. HbA1C: No results for input(s): HGBA1C in the last 72 hours. CBG: No results for input(s): GLUCAP in the last 168 hours. Lipid Profile: No results for input(s): CHOL, HDL, LDLCALC, TRIG, CHOLHDL, LDLDIRECT in the last 72 hours. Thyroid Function Tests: No results for input(s): TSH, T4TOTAL, FREET4, T3FREE, THYROIDAB in the last 72 hours. Anemia Panel: No results for input(s): VITAMINB12, FOLATE, FERRITIN, TIBC, IRON, RETICCTPCT in the last 72 hours. Urine analysis:    Component Value Date/Time   COLORURINE YELLOW 07/15/2015 1440   APPEARANCEUR CLEAR 07/15/2015 1440   LABSPEC 1.015 07/15/2015 1440   PHURINE 6.0 07/15/2015 1440   GLUCOSEU NEGATIVE 07/15/2015 1440   HGBUR TRACE-LYSED (A) 07/15/2015 1440  BILIRUBINUR NEGATIVE 07/15/2015 1440   KETONESUR TRACE (A) 07/15/2015 1440   PROTEINUR NEGATIVE 07/06/2012 0341    UROBILINOGEN 1.0 07/15/2015 1440   NITRITE NEGATIVE 07/15/2015 1440   LEUKOCYTESUR NEGATIVE 07/15/2015 1440   Sepsis Labs: (procalcitonin:4,lacticidven:4) )No results found for this or any previous visit (from the past 240 hour(s)).   Radiological Exams on Admission: Dg Chest Port 1 View  Result Date: 12/21/2018 CLINICAL DATA:  78 year old male with shortness of breath. EXAM: PORTABLE CHEST 1 VIEW COMPARISON:  09/24/2018 and earlier. FINDINGS: Portable AP semi upright view at 1935 hours. Stable lung volumes and cardiomegaly. Other mediastinal contours are within normal limits. Visualized tracheal air column is within normal limits. Bilateral peripheral and basilar predominant pulmonary interstitial opacity appears stable. No pneumothorax, pleural effusion or acute pulmonary opacity. Negative visible bowel gas pattern. IMPRESSION: No acute cardiopulmonary abnormality. Stable cardiomegaly and chronic pulmonary interstitial changes. Electronically Signed   By: Odessa Fleming M.D.   On: 12/21/2018 19:49    EKG: Independently reviewed. Undetermined rhythm, non-specific IVCD.   Assessment/Plan   1. COPD with acute exacerbation; acute on chronic hypoxic respiratory failure  - Presents with progressive SOB with increased cough and wheezing, improves with nebs at home but quickly worsens again; using 2-4 Lpm atc at home  - Saturating 80's on BiPAP initially, saturations and WOB improved with 125 mg IV Solu-medrol and continuous albuterol treatment in ED  - Check sputum culture, continue systemic steroid, start azithromycin, continue nebs, transition back to nasal canula as he continues improve    2. Chronic diastolic CHF  - Appears euvolemic  - BNP mildly elevated, similar to priors  - SLIV, follow daily wt and I/O's    3. Atrial fibrillation  - CHADS-VASc 4 (age x2, DM, CHF) - Rate is controlled in ED - Med-rec pending, but has not been on anticoagulants per recent notes    4. Type II DM   - A1c 5.9% this month  - Med-rec pending  - Check CBG's and use a low-intensity SSI with Novolog as needed for now    DVT prophylaxis: Lovenox  Code Status: Full  Family Communication: Discussed with patient  Consults called: none  Admission status: Observation     Briscoe Deutscher, MD [facility] Pager [phone]  If 7PM-7AM, please contact night-coverage [email or web address] Password [email or web address]  12/22/2018, 2:08 AM

## 2018-12-22 NOTE — ED Notes (Signed)
ED TO INPATIENT HANDOFF REPORT  ED Nurse Name and Phone #:  Alan Ripper [phone]  S Name/Age/Gender Bradley Searles Sr. 78 y.o. male Room/Bed: 024C/024C  Code Status   Code Status: Full Code  Home/SNF/Other Home Patient oriented to: self, place, time and situation Is this baseline? Yes   Triage Complete: Triage complete  Chief Complaint Resp distress  Triage Note Pt arrives with [facility] from home c/o increasing [facility] that has been "going on for the past few weeks" and has significantly increased over the past few days. Per EMS, pt is dyspneic on exertion and while laying flat. Pt states he uses 2L O2 during the day and 3-4L at bedtime. Pt has hx of CHF, COPD, HTN, and DM. Pt a&o x4, VSS at this time. Pt currently connected to high flow 10L by RT.  BP 123/76 HR 70's O2 sat 99% non NiV RR 18-20     Allergies Allergies  Allergen Reactions  . Hydrocodone Other (See Comments)    "given to him in the hospital; went thru withdrawals once home; dr said not to take it again" (09/27/2012)  . Tramadol Other (See Comments)    "given to him in the hospital; went thru withdrawals once home; dr said not to take it again" (09/27/2012)  . Tadalafil Other (See Comments)    headache, backache  . Avelox [Moxifloxacin Hydrochloride] Itching and Other (See Comments)    Headache    Level of Care/Admitting Diagnosis ED Disposition    ED Disposition Condition Comment   Admit  Hospital Area: [facility] [[id number]]  Level of Care: Progressive [102]  I expect the patient will be discharged within 24 hours: Yes  LOW acuity---Tx typically complete <24 hrs---ACUTE conditions typically can be evaluated <24 hours---LABS likely to return to acceptable levels <24 hours---IS near functional baseline---EXPECTED to return to current living arrangement---NOT newly hypoxic: Does not meet criteria for 5C-Observation unit  Diagnosis: COPD with acute exacerbation [facility]) [[id number]]  Admitting  Physician: Briscoe Deutscher [[id number]]  Attending Physician: Briscoe Deutscher [[id number]]  PT Class (Do Not Modify): Observation [104]  PT Acc Code (Do Not Modify): Observation [10022]       B Medical/Surgery History Past Medical History:  Diagnosis Date  . Abnormal CT scan, chest 2012   CT chest, several lymphadenopathies. Sees pulmonary  . Anemia    intermittent  . Arthritis    "?back" (09/19/2018)  . Atrial fibrillation (HCC)    s/p AV node ablation & BiV PPM implantation 09/08/11 (op dictation pending)  . BPH (benign prostatic hyperplasia)    Saw Dr [name] 2004, normal renal u/s  . Bronchitis 08/26/2017  . CAD (coronary artery disease)   . CHF (congestive heart failure) (HCC)    Thought primarily to be non-systolic although EF down (EF 91-79% 12/2010, down to 35-40% 09/05/11), cath 2008 with no CAD, nuclear study 07/2011 showing Small area of reversibility in the distal ant/lat wall the left ventricle suspicious for ischemia/septal wall HK but felt to be low risk  (per D/C Summary 07/2011)  . Chronic bronchitis (HCC)    "get it ~ q yr"  . Heart murmur   . HLD (hyperlipidemia)   . HTN (hypertension)   . 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 Insomnia   . Migraines    "very very rare"  . On home oxygen therapy    "2L; 24/7" (09/19/2018)  . Pacemaker   . Peripheral vascular disease (HCC)    ??  .  Pleural effusion 2008   S/p decortication  . Pulmonary HTN (HCC)    per cath 2008  . Type II diabetes mellitus (HCC) 1999   Past Surgical History:  Procedure Laterality Date  . BI-VENTRICULAR PACEMAKER INSERTION Left 09/08/2011   Procedure: BI-VENTRICULAR PACEMAKER INSERTION (CRT-P);  Surgeon: Marinus Maw, MD;  Location: [facility] CATH LAB;  Service: Cardiovascular;  Laterality: Left;  . CARDIAC CATHETERIZATION     "couple times; never had balloon or stent" (09/19/2018)  . CATARACT EXTRACTION W/ INTRAOCULAR LENS  IMPLANT, BILATERAL Bilateral  08/2018  . COLONOSCOPY  03/10/11   normal  . INSERT / REPLACE / REMOVE PACEMAKER  09/08/11   pacemaker placement  . LUNG DECORTICATION    . PLEURAL SCARIFICATION    . pneumothorax with fibrothorax  ~ 2010  . TONSILLECTOMY     "as a kid"      A IV Location/Drains/Wounds Patient Lines/Drains/Airways Status   Active Line/Drains/Airways    Name:   Placement date:   Placement time:   Site:   Days:   Peripheral IV 12/21/18 Left Hand   12/21/18    1836    Hand   1   Peripheral IV 12/21/18 Left Antecubital   12/21/18    1835    Antecubital   1          Intake/Output Last 24 hours  Intake/Output Summary (Last 24 hours) at 12/22/2018 0442 Last data filed at 12/22/2018 0005 Gross per 24 hour  Intake 50 ml  Output 300 ml  Net -250 ml    Labs/Imaging Results for orders placed or performed during the hospital encounter of 12/21/18 (from the past 48 hour(s))  I-STAT 7, (LYTES, BLD GAS, ICA, H+H)     Status: Abnormal   Collection Time: 12/21/18  8:22 PM  Result Value Ref Range   pH, Arterial 7.399 7.350 - 7.450   pCO2 arterial 41.8 32.0 - 48.0 mmHg   pO2, Arterial 48.0 (L) 83.0 - 108.0 mmHg   Bicarbonate 25.9 20.0 - 28.0 mmol/L   TCO2 27 22 - 32 mmol/L   O2 Saturation 83.0 %   Acid-Base Excess 1.0 0.0 - 2.0 mmol/L   Sodium 138 135 - 145 mmol/L   Potassium 4.2 3.5 - 5.1 mmol/L   Calcium, Ion 1.19 1.15 - 1.40 mmol/L   HCT 40.0 39.0 - 52.0 %   Hemoglobin 13.6 13.0 - 17.0 g/dL   Patient temperature 04.5 F    Collection site RADIAL, ALLEN'S TEST ACCEPTABLE    Drawn by RT    Sample type ARTERIAL   Basic metabolic panel     Status: Abnormal   Collection Time: 12/21/18  8:30 PM  Result Value Ref Range   Sodium 137 135 - 145 mmol/L   Potassium 4.4 3.5 - 5.1 mmol/L   Chloride 104 98 - 111 mmol/L   CO2 27 22 - 32 mmol/L   Glucose, Bld 153 (H) 70 - 99 mg/dL   BUN 14 8 - 23 mg/dL   Creatinine, Ser 4.09 0.61 - 1.24 mg/dL   Calcium 8.4 (L) 8.9 - 10.3 mg/dL   GFR calc non Af Amer >60  >60 mL/min   GFR calc Af Amer >60 >60 mL/min   Anion gap 6 5 - 15    Comment: Performed at [facility], [address], [place], [place] [address]  Troponin I - Once     Status: None   Collection Time: 12/21/18  8:30 PM  Result Value Ref  Range   Troponin I <0.03 <0.03 ng/mL    Comment: Performed at [facility], [address], [place], [place] [address]  CBC with Differential     Status: Abnormal   Collection Time: 12/21/18  8:30 PM  Result Value Ref Range   WBC 15.0 (H) 4.0 - 10.5 K/uL   RBC 4.77 4.22 - 5.81 MIL/uL   Hemoglobin 14.3 13.0 - 17.0 g/dL   HCT 17.5 10.2 - 58.5 %   MCV 92.5 80.0 - 100.0 fL   MCH 30.0 26.0 - 34.0 pg   MCHC 32.4 30.0 - 36.0 g/dL   RDW 27.7 82.4 - 23.5 %   Platelets 189 150 - 400 K/uL   nRBC 0.0 0.0 - 0.2 %   Neutrophils Relative % 81 %   Neutro Abs 12.2 (H) 1.7 - 7.7 K/uL   Lymphocytes Relative 11 %   Lymphs Abs 1.6 0.7 - 4.0 K/uL   Monocytes Relative 7 %   Monocytes Absolute 1.0 0.1 - 1.0 K/uL   Eosinophils Relative 0 %   Eosinophils Absolute 0.0 0.0 - 0.5 K/uL   Basophils Relative 0 %   Basophils Absolute 0.0 0.0 - 0.1 K/uL   Immature Granulocytes 1 %   Abs Immature Granulocytes 0.10 (H) 0.00 - 0.07 K/uL    Comment: Performed at [facility], [address], [place], [place] [address]  Brain natriuretic peptide     Status: Abnormal   Collection Time: 12/21/18  8:30 PM  Result Value Ref Range   B Natriuretic Peptide 237.9 (H) 0.0 - 100.0 pg/mL    Comment: Performed at [facility], [address], [place], [place] [address]  I-STAT 7, (LYTES, BLD GAS, ICA, H+H)     Status: Abnormal   Collection Time: 12/21/18  9:43 PM  Result Value Ref Range   pH, Arterial 7.359 7.350 - 7.450   pCO2 arterial 48.8 (H) 32.0 - 48.0 mmHg   pO2, Arterial 216.0 (H) 83.0 - 108.0 mmHg   Bicarbonate 27.5 20.0 - 28.0 mmol/L   TCO2 29 22 - 32 mmol/L   O2 Saturation 100.0 %   Acid-Base Excess 1.0 0.0 - 2.0 mmol/L   Sodium 137 135 - 145 mmol/L    Potassium 4.3 3.5 - 5.1 mmol/L   Calcium, Ion 1.19 1.15 - 1.40 mmol/L   HCT 40.0 39.0 - 52.0 %   Hemoglobin 13.6 13.0 - 17.0 g/dL   Patient temperature 08.6 F    Collection site RADIAL, ALLEN'S TEST ACCEPTABLE    Drawn by RT    Sample type ARTERIAL   Basic metabolic panel     Status: Abnormal   Collection Time: 12/22/18  2:37 AM  Result Value Ref Range   Sodium 137 135 - 145 mmol/L   Potassium 4.3 3.5 - 5.1 mmol/L   Chloride 106 98 - 111 mmol/L   CO2 25 22 - 32 mmol/L   Glucose, Bld 130 (H) 70 - 99 mg/dL   BUN 14 8 - 23 mg/dL   Creatinine, Ser 7.61 0.61 - 1.24 mg/dL   Calcium 8.4 (L) 8.9 - 10.3 mg/dL   GFR calc non Af Amer >60 >60 mL/min   GFR calc Af Amer >60 >60 mL/min   Anion gap 6 5 - 15    Comment: Performed at [facility], [address], [place], [place] [address]  CBC WITH DIFFERENTIAL     Status: Abnormal   Collection Time: 12/22/18  2:37 AM  Result Value Ref Range   WBC 13.1 (H) 4.0 - 10.5 K/uL    Comment: WHITE COUNT CONFIRMED ON SMEAR   RBC 4.45 4.22 - 5.81 MIL/uL   Hemoglobin 13.7 13.0 - 17.0 g/dL   HCT 16.140.8 09.639.0 - 04.552.0 %   MCV 91.7 80.0 - 100.0 fL   MCH 30.8 26.0 - 34.0 pg   MCHC 33.6 30.0 - 36.0 g/dL   RDW 40.913.2 81.111.5 - 91.415.5 %   Platelets 170 150 - 400 K/uL   nRBC 0.0 0.0 - 0.2 %   Neutrophils Relative % 92 %   Lymphocytes Relative 5 %   Monocytes Relative 3 %   Eosinophils Relative 0 %   Basophils Relative 0 %   Band Neutrophils 0 %   Metamyelocytes Relative 0 %   Myelocytes 0 %   Promyelocytes Relative 0 %   Blasts 0 %   nRBC 0 0 /100 WBC   Other 0 %   Neutro Abs 12.0 (H) 1.7 - 7.7 K/uL   Lymphs Abs 0.7 0.7 - 4.0 K/uL   Monocytes Absolute 0.4 0.1 - 1.0 K/uL   Eosinophils Absolute 0.0 0.0 - 0.5 K/uL   Basophils Absolute 0.0 0.0 - 0.1 K/uL   WBC Morphology FEW BANDS NOTED ON SMEAR     Comment: Performed at [facility], [address] [place], [place] [address]   Dg Chest Port 1 View  Result Date: 12/21/2018 CLINICAL DATA:   78 year old male with shortness of breath. EXAM: PORTABLE CHEST 1 VIEW COMPARISON:  09/24/2018 and earlier. FINDINGS: Portable AP semi upright view at 1935 hours. Stable lung volumes and cardiomegaly. Other mediastinal contours are within normal limits. Visualized tracheal air column is within normal limits. Bilateral peripheral and basilar predominant pulmonary interstitial opacity appears stable. No pneumothorax, pleural effusion or acute pulmonary opacity. Negative visible bowel gas pattern. IMPRESSION: No acute cardiopulmonary abnormality. Stable cardiomegaly and chronic pulmonary interstitial changes. Electronically Signed   By: Odessa FlemingH  Hall M.D.   On: 12/21/2018 19:49    Pending Labs Unresulted Labs (From admission, onward)    Start     Ordered   12/29/18 0500  Creatinine, serum  (enoxaparin (LOVENOX)    CrCl >/= 30 ml/min)  Weekly,   R    Comments:  while on enoxaparin therapy    12/22/18 0206   12/22/18 0203  Culture, sputum-assessment  Once,   R     12/22/18 0206   12/22/18 0203  Gram stain  Once,   R     12/22/18 0206          Vitals/Pain Today's Vitals   12/22/18 0315 12/22/18 0354 12/22/18 0356 12/22/18 0405  BP: 116/64     Pulse: 69  71   Resp: (!) 31  (!) 28   SpO2: 96% 96% 94% 94%  Weight:      Height:      PainSc:        Isolation Precautions No active isolations  Medications Medications  enoxaparin (LOVENOX) injection 40 mg (has no administration in time range)  sodium chloride flush (NS) 0.9 % injection 3 mL (has no administration in time range)  sodium chloride flush (NS) 0.9 % injection 3 mL (has no administration in time range)  sodium chloride flush (NS) 0.9 % injection 3 mL (has no administration in time range)  0.9 %  sodium chloride infusion (has no administration in time range)  acetaminophen (TYLENOL) tablet 650 mg (has no administration in time range)  Or  acetaminophen (TYLENOL) suppository 650 mg (has no administration in time range)  ondansetron  (ZOFRAN) tablet 4 mg (has no administration in time range)    Or  ondansetron (ZOFRAN) injection 4 mg (has no administration in time range)  azithromycin (ZITHROMAX) 500 mg in sodium chloride 0.9 % 250 mL IVPB (500 mg Intravenous New Bag/Given 12/22/18 0331)  insulin aspart (novoLOG) injection 0-9 Units (has no administration in time range)  insulin aspart (novoLOG) injection 0-5 Units (has no administration in time range)  methylPREDNISolone sodium succinate (SOLU-MEDROL) 125 mg/2 mL injection 60 mg (has no administration in time range)  albuterol (PROVENTIL) (2.5 MG/3ML) 0.083% nebulizer solution 2.5 mg (has no administration in time range)  ipratropium-albuterol (DUONEB) 0.5-2.5 (3) MG/3ML nebulizer solution 3 mL (has no administration in time range)  albuterol (PROVENTIL,VENTOLIN) solution continuous neb (10 mg/hr Nebulization Given 12/21/18 1939)  magnesium sulfate IVPB 2 g 50 mL (0 g Intravenous Stopped 12/21/18 2200)  LORazepam (ATIVAN) injection 1 mg (1 mg Intravenous Given 12/21/18 2213)  methylPREDNISolone sodium succinate (SOLU-MEDROL) 125 mg/2 mL injection 125 mg (125 mg Intravenous Given 12/21/18 2355)    Mobility walks Moderate fall risk   Focused Assessments Pulmonary Assessment Handoff:  Lung sounds: Bilateral Breath Sounds: Rhonchi, Diminished L Breath Sounds: Inspiratory wheezes, Diminished, Fine crackles R Breath Sounds: Inspiratory wheezes O2 Device: High Flow Nasal Cannula(salter) O2 Flow Rate (L/min): (S) 8 L/min      R Recommendations: See Admitting Provider Note  Report given to:   Additional Notes:

## 2018-12-22 NOTE — Progress Notes (Signed)
Chaplain responded to spiritual consult  Patient desired communion.  Chaplain shared with patient that communion servers were not able to visit during the pandemic. COVID restrictions suggest no visitors but immediate family may come to see patients. Chaplain had pleasant visit with patient and offered ministry of presence and prayer. Lynnell Chad Pager [phone].

## 2018-12-23 DIAGNOSIS — E118 Type 2 diabetes mellitus with unspecified complications: Secondary | ICD-10-CM

## 2018-12-23 LAB — GLUCOSE, CAPILLARY
Glucose-Capillary: 257 mg/dL — ABNORMAL HIGH (ref 70–99)
Glucose-Capillary: 214 mg/dL — ABNORMAL HIGH (ref 70–99)
Glucose-Capillary: 218 mg/dL — ABNORMAL HIGH (ref 70–99)
Glucose-Capillary: 250 mg/dL — ABNORMAL HIGH (ref 70–99)

## 2018-12-23 MED ORDER — LORATADINE 10 MG PO TABS
10.0000 mg | ORAL_TABLET | Freq: Every day | ORAL | Status: DC
  Administered 2018-12-23 – 2018-12-26 (×4): 10 mg via ORAL
  Filled 2018-12-23 (×3): qty 1

## 2018-12-23 MED ORDER — LINAGLIPTIN 5 MG PO TABS
5.0000 mg | ORAL_TABLET | Freq: Every day | ORAL | Status: DC
  Administered 2018-12-23 – 2018-12-26 (×4): 5 mg via ORAL
  Filled 2018-12-23 (×3): qty 1

## 2018-12-23 MED ORDER — AZITHROMYCIN 500 MG PO TABS
500.0000 mg | ORAL_TABLET | ORAL | Status: DC
  Administered 2018-12-23: 500 mg via ORAL
  Filled 2018-12-23: qty 1

## 2018-12-23 NOTE — Progress Notes (Signed)
Pt admitted to 5W11. Pt A&Ox4. Pt tachypnic, but denies any pain. Pt oriented to unit. Will continue to monitor and treat per MD orders.

## 2018-12-23 NOTE — Progress Notes (Signed)
PROGRESS NOTE    Bradley OHORA Sr.  EKC:[id number]  DOB: August 05, 1941  DOA: 12/21/2018 PCP: [name], MD  Brief Narrative: 78 year old male with history of A. fib not on anticoagulation status post pacemaker, chronic diastolic CHF, COPD with home O2 2 to 4 L nasal cannula at baseline, diabetes mellitus type 2 presented to ED on March 13 with complaints of cough and shortness of breath unrelieved with neb treatments.  He was hypoxic on presentation with O2 sat in 80s requiring BiPAP transiently.  Chest x-ray negative for infiltrates.  BNP 238.He reports history of pneumonia late last year requiring admission to [facility].  Patient now admitted for COPD exacerbation with acute on chronic hypoxic respiratory failure requiring 8 L of O2.  Started on IV steroids/empiric antibiotics.   Subjective:Patient reports feeling better today but still requiring 8 L of O2.  Noted to have pursed lip breathing but states that is his baseline. Objective: Vitals:   12/23/18 0512 12/23/18 0754 12/23/18 1300 12/23/18 1326  BP: 118/70   128/65  Pulse: 71   72  Resp:    18  Temp: 97.9 F (36.6 C)   97.9 F (36.6 C)  TempSrc: Oral   Oral  SpO2: 95% 94% 93% 94%  Weight:      Height:        Intake/Output Summary (Last 24 hours) at 12/23/2018 1352 Last data filed at 12/23/2018 1327 Gross per 24 hour  Intake 792.89 ml  Output 700 ml  Net 92.89 ml   Filed Weights   12/21/18 1851 12/22/18 0642 12/23/18 0500  Weight: 103.4 kg 89.3 kg 90.8 kg    Physical Examination:  General exam: Appears calm and comfortable  Respiratory system: Distant breath sounds but clear to auscultation.  Appears tachypneic while talking full sentences with pursed lip breathing but states chronic Cardiovascular system: S1 & S2 heard, RRR. No JVD, murmurs, rubs, gallops or clicks. No pedal edema. Gastrointestinal system: Abdomen is nondistended, soft and nontender. No organomegaly or masses felt. Normal bowel sounds  heard. Central nervous system: Alert and oriented. No focal neurological deficits. Extremities: Symmetric 5 x 5 power. Skin: No rashes, lesions or ulcers Psychiatry: Judgement and insight appear normal. Mood & affect appropriate.     Data Reviewed: I have personally reviewed following labs and imaging studies  CBC: Recent Labs  Lab 12/21/18 2022 12/21/18 2030 12/21/18 2143 12/22/18 0237  WBC  --  15.0*  --  13.1*  NEUTROABS  --  12.2*  --  12.0*  HGB 13.6 14.3 13.6 13.7  HCT 40.0 44.1 40.0 40.8  MCV  --  92.5  --  91.7  PLT  --  189  --  170   Basic Metabolic Panel: Recent Labs  Lab 12/21/18 2022 12/21/18 2030 12/21/18 2143 12/22/18 0237  NA 138 137 137 137  K 4.2 4.4 4.3 4.3  CL  --  104  --  106  CO2  --  27  --  25  GLUCOSE  --  153*  --  130*  BUN  --  14  --  14  CREATININE  --  0.96  --  0.91  CALCIUM  --  8.4*  --  8.4*   GFR: Estimated Creatinine Clearance: 75.7 mL/min (by C-G formula based on SCr of 0.91 mg/dL). Liver Function Tests: No results for input(s): AST, ALT, ALKPHOS, BILITOT, PROT, ALBUMIN in the last 168 hours. No results for input(s): LIPASE, AMYLASE in the last 168  hours. No results for input(s): AMMONIA in the last 168 hours. Coagulation Profile: No results for input(s): INR, PROTIME in the last 168 hours. Cardiac Enzymes: Recent Labs  Lab 12/21/18 2030  TROPONINI <0.03   BNP (last 3 results) No results for input(s): PROBNP in the last 8760 hours. HbA1C: No results for input(s): HGBA1C in the last 72 hours. CBG: Recent Labs  Lab 12/22/18 1141 12/22/18 1648 12/22/18 2201 12/23/18 0755 12/23/18 1216  GLUCAP 196* 195* 189* 257* 214*   Lipid Profile: No results for input(s): CHOL, HDL, LDLCALC, TRIG, CHOLHDL, LDLDIRECT in the last 72 hours. Thyroid Function Tests: No results for input(s): TSH, T4TOTAL, FREET4, T3FREE, THYROIDAB in the last 72 hours. Anemia Panel: No results for input(s): VITAMINB12, FOLATE, FERRITIN, TIBC,  IRON, RETICCTPCT in the last 72 hours. Sepsis Labs: No results for input(s): PROCALCITON, LATICACIDVEN in the last 168 hours.  Recent Results (from the past 240 hour(s))  MRSA PCR Screening     Status: None   Collection Time: 12/22/18  8:18 AM  Result Value Ref Range Status   MRSA by PCR NEGATIVE NEGATIVE Final    Comment:        The GeneXpert MRSA Assay (FDA approved for NASAL specimens only), is one component of a comprehensive MRSA colonization surveillance program. It is not intended to diagnose MRSA infection nor to guide or monitor treatment for MRSA infections. Performed at [facility], [address] [place], [place] [address]   Respiratory Panel by PCR     Status: None   Collection Time: 12/22/18  9:05 AM  Result Value Ref Range Status   Adenovirus NOT DETECTED NOT DETECTED Final   Coronavirus 229E NOT DETECTED NOT DETECTED Final    Comment: (NOTE) The Coronavirus on the Respiratory Panel, DOES NOT test for the novel  Coronavirus (2019 nCoV)    Coronavirus HKU1 NOT DETECTED NOT DETECTED Final   Coronavirus NL63 NOT DETECTED NOT DETECTED Final   Coronavirus OC43 NOT DETECTED NOT DETECTED Final   Metapneumovirus NOT DETECTED NOT DETECTED Final   Rhinovirus / Enterovirus NOT DETECTED NOT DETECTED Final   Influenza A NOT DETECTED NOT DETECTED Final   Influenza B NOT DETECTED NOT DETECTED Final   Parainfluenza Virus 1 NOT DETECTED NOT DETECTED Final   Parainfluenza Virus 2 NOT DETECTED NOT DETECTED Final   Parainfluenza Virus 3 NOT DETECTED NOT DETECTED Final   Parainfluenza Virus 4 NOT DETECTED NOT DETECTED Final   Respiratory Syncytial Virus NOT DETECTED NOT DETECTED Final   Bordetella pertussis NOT DETECTED NOT DETECTED Final   Chlamydophila pneumoniae NOT DETECTED NOT DETECTED Final   Mycoplasma pneumoniae NOT DETECTED NOT DETECTED Final    Comment: Performed at [facility], [address], [place], [place] [address]      Radiology Studies: Dg  Chest [place] 1 View  Result Date: 12/21/2018 CLINICAL DATA:  78 year old male with shortness of breath. EXAM: PORTABLE CHEST 1 VIEW COMPARISON:  09/24/2018 and earlier. FINDINGS: Portable AP semi upright view at 1935 hours. Stable lung volumes and cardiomegaly. Other mediastinal contours are within normal limits. Visualized tracheal air column is within normal limits. Bilateral peripheral and basilar predominant pulmonary interstitial opacity appears stable. No pneumothorax, pleural effusion or acute pulmonary opacity. Negative visible bowel gas pattern. IMPRESSION: No acute cardiopulmonary abnormality. Stable cardiomegaly and chronic pulmonary interstitial changes. Electronically Signed   By: Odessa Fleming M.D.   On: 12/21/2018 19:49        Scheduled Meds: . azithromycin  500 mg Oral Q24H  . benzonatate  100 mg Oral TID  . dextromethorphan-guaiFENesin  1 tablet Oral BID  . enoxaparin (LOVENOX) injection  40 mg Subcutaneous Daily  . furosemide  40 mg Oral Daily  . insulin aspart  0-5 Units Subcutaneous QHS  . insulin aspart  0-9 Units Subcutaneous TID WC  . ipratropium-albuterol  3 mL Nebulization TID  . methylPREDNISolone (SOLU-MEDROL) injection  60 mg Intravenous Q6H  . sodium chloride flush  3 mL Intravenous Q12H  . sodium chloride flush  3 mL Intravenous Q12H   Continuous Infusions: . sodium chloride 250 mL (12/22/18 0856)  . cefTRIAXone (ROCEPHIN)  IV 1 g (12/23/18 1042)    Assessment & Plan:    1.  Acute COPD exacerbation with viral bronchitis: Continue IV steroids and empiric antibiotics, taper O2 as tolerated.  Neb treatments PRN and scheduled.  Viral panel revealed positive for rhino/enterovirus but negative for flu.  Add antihistamine.continue mucolytics/antitussives  2.  Leukocytosis: Secondary to problem #1.  Present on admission at 15,000 and improving.  3.  Diabetes mellitus with hyperglycemia in the setting of IV steroids: Patient on Januvia at home.  Continue sliding scale  insulin and substitute with Tradjenta while here.  4.  Chronic atrial fibrillation With SSS: Status post pacemaker.  CHADS-VASc 4 (age x2, DM, CHF). Rate is controlled.  Not on anticoagulation at baseline  5.  Chronic diastolic CHF: Compensated.  Received IV fluids in the ED for hypotension.  Currently normotensive and resumed home Lasix    DVT prophylaxis: Lovenox Code Status: Full code Family / Patient Communication: Discussed with patient Disposition Plan: Home when medically cleared     LOS: 1 day    Time spent: 35 minutes    Alessandra Bevels, MD [facility] Pager [phone]  If 7PM-7AM, please contact night-coverage [email or web address] Password [email or web address] 12/23/2018, 1:52 PM

## 2018-12-24 LAB — GLUCOSE, CAPILLARY
Glucose-Capillary: 204 mg/dL — ABNORMAL HIGH (ref 70–99)
Glucose-Capillary: 267 mg/dL — ABNORMAL HIGH (ref 70–99)
Glucose-Capillary: 280 mg/dL — ABNORMAL HIGH (ref 70–99)
Glucose-Capillary: 328 mg/dL — ABNORMAL HIGH (ref 70–99)
Glucose-Capillary: 247 mg/dL — ABNORMAL HIGH (ref 70–99)

## 2018-12-24 MED ORDER — AZITHROMYCIN 500 MG PO TABS
250.0000 mg | ORAL_TABLET | ORAL | Status: DC
  Administered 2018-12-24 – 2018-12-25 (×2): 250 mg via ORAL
  Filled 2018-12-24 (×2): qty 1

## 2018-12-24 MED ORDER — METHYLPREDNISOLONE SODIUM SUCC 40 MG IJ SOLR
40.0000 mg | Freq: Two times a day (BID) | INTRAMUSCULAR | Status: DC
  Administered 2018-12-25 – 2018-12-26 (×3): 40 mg via INTRAVENOUS
  Filled 2018-12-24 (×3): qty 1

## 2018-12-24 NOTE — Progress Notes (Signed)
  No distress noted for BIPAP order. Weaned to 5 lpm [place].

## 2018-12-24 NOTE — Progress Notes (Signed)
PROGRESS NOTE    Bradley [name].  XIH:[id number]  DOB: 05-14-1941  DOA: 12/21/2018 PCP: [name], MD  Brief Narrative: 78 year old male with history of A. fib not on anticoagulation status post pacemaker, chronic diastolic CHF, COPD with home O2 2 to 4 L nasal cannula at baseline, diabetes mellitus type 2 presented to ED on March 13 with complaints of cough and shortness of breath unrelieved with neb treatments.  He was hypoxic on presentation with O2 sat in 80s requiring BiPAP transiently.  Chest x-ray negative for infiltrates.  BNP 238.He reports history of pneumonia late last year requiring admission to [facility] long hospital.  Patient now admitted for COPD exacerbation with acute on chronic hypoxic respiratory failure requiring 8 L of O2.  Started on IV steroids/empiric antibiotics.   Subjective:Patient reports feeling better today and O2 requirement down to 5 L.  Objective: Vitals:   12/23/18 2131 12/24/18 0536 12/24/18 0722 12/24/18 1300  BP: 130/80 125/78    Pulse: 84 72    Resp: 18 17    Temp: 98.2 F (36.8 C) 98.3 F (36.8 C)    TempSrc: Oral Oral    SpO2:  97% 94% 94%  Weight:  91.2 kg    Height:        Intake/Output Summary (Last 24 hours) at 12/24/2018 1609 Last data filed at 12/24/2018 1202 Gross per 24 hour  Intake 843 ml  Output 1050 ml  Net -207 ml   Filed Weights   12/22/18 0642 12/23/18 0500 12/24/18 0536  Weight: 89.3 kg 90.8 kg 91.2 kg    Physical Examination:  General exam: Appears calm and comfortable  Respiratory system: Distant breath sounds but clear to auscultation.  Less tachypneic today while talking full sentences  Cardiovascular system: S1 & S2 heard, RRR. No JVD, murmurs, rubs, gallops or clicks. No pedal edema. Gastrointestinal system: Abdomen is nondistended, soft and nontender. No organomegaly or masses felt. Normal bowel sounds heard. Central nervous system: Alert and oriented. No focal neurological deficits. Extremities: Symmetric 5 x  5 power. Skin: No rashes, lesions or ulcers Psychiatry: Judgement and insight appear normal. Mood & affect appropriate.     Data Reviewed: I have personally reviewed following labs and imaging studies  CBC: Recent Labs  Lab 12/21/18 2022 12/21/18 2030 12/21/18 2143 12/22/18 0237  WBC  --  15.0*  --  13.1*  NEUTROABS  --  12.2*  --  12.0*  HGB 13.6 14.3 13.6 13.7  HCT 40.0 44.1 40.0 40.8  MCV  --  92.5  --  91.7  PLT  --  189  --  170   Basic Metabolic Panel: Recent Labs  Lab 12/21/18 2022 12/21/18 2030 12/21/18 2143 12/22/18 0237  NA 138 137 137 137  K 4.2 4.4 4.3 4.3  CL  --  104  --  106  CO2  --  27  --  25  GLUCOSE  --  153*  --  130*  BUN  --  14  --  14  CREATININE  --  0.96  --  0.91  CALCIUM  --  8.4*  --  8.4*   GFR: Estimated Creatinine Clearance: 75.9 mL/min (by C-G formula based on SCr of 0.91 mg/dL). Liver Function Tests: No results for input(s): AST, ALT, ALKPHOS, BILITOT, PROT, ALBUMIN in the last 168 hours. No results for input(s): LIPASE, AMYLASE in the last 168 hours. No results for input(s): AMMONIA in the last 168 hours. Coagulation Profile: No results for  input(s): INR, PROTIME in the last 168 hours. Cardiac Enzymes: Recent Labs  Lab 12/21/18 2030  TROPONINI <0.03   BNP (last 3 results) No results for input(s): PROBNP in the last 8760 hours. HbA1C: No results for input(s): HGBA1C in the last 72 hours. CBG: Recent Labs  Lab 12/23/18 1216 12/23/18 1654 12/23/18 2129 12/24/18 0814 12/24/18 1200  GLUCAP 214* 218* 250* 204* 267*   Lipid Profile: No results for input(s): CHOL, HDL, LDLCALC, TRIG, CHOLHDL, LDLDIRECT in the last 72 hours. Thyroid Function Tests: No results for input(s): TSH, T4TOTAL, FREET4, T3FREE, THYROIDAB in the last 72 hours. Anemia Panel: No results for input(s): VITAMINB12, FOLATE, FERRITIN, TIBC, IRON, RETICCTPCT in the last 72 hours. Sepsis Labs: No results for input(s): PROCALCITON, LATICACIDVEN in the  last 168 hours.  Recent Results (from the past 240 hour(s))  MRSA PCR Screening     Status: None   Collection Time: 12/22/18  8:18 AM  Result Value Ref Range Status   MRSA by PCR NEGATIVE NEGATIVE Final    Comment:        The GeneXpert MRSA Assay (FDA approved for NASAL specimens only), is one component of a comprehensive MRSA colonization surveillance program. It is not intended to diagnose MRSA infection nor to guide or monitor treatment for MRSA infections. Performed at [facility], [address], [place], [place] [address]   Respiratory Panel by PCR     Status: None   Collection Time: 12/22/18  9:05 AM  Result Value Ref Range Status   Adenovirus NOT DETECTED NOT DETECTED Final   Coronavirus 229E NOT DETECTED NOT DETECTED Final    Comment: (NOTE) The Coronavirus on the Respiratory Panel, DOES NOT test for the novel  Coronavirus (2019 nCoV)    Coronavirus HKU1 NOT DETECTED NOT DETECTED Final   Coronavirus NL63 NOT DETECTED NOT DETECTED Final   Coronavirus OC43 NOT DETECTED NOT DETECTED Final   Metapneumovirus NOT DETECTED NOT DETECTED Final   Rhinovirus / Enterovirus NOT DETECTED NOT DETECTED Final   Influenza A NOT DETECTED NOT DETECTED Final   Influenza B NOT DETECTED NOT DETECTED Final   Parainfluenza Virus 1 NOT DETECTED NOT DETECTED Final   Parainfluenza Virus 2 NOT DETECTED NOT DETECTED Final   Parainfluenza Virus 3 NOT DETECTED NOT DETECTED Final   Parainfluenza Virus 4 NOT DETECTED NOT DETECTED Final   Respiratory Syncytial Virus NOT DETECTED NOT DETECTED Final   Bordetella pertussis NOT DETECTED NOT DETECTED Final   Chlamydophila pneumoniae NOT DETECTED NOT DETECTED Final   Mycoplasma pneumoniae NOT DETECTED NOT DETECTED Final    Comment: Performed at [facility], [address], [place], [place] [address]      Radiology Studies: No results found.      Scheduled Meds: . azithromycin  500 mg Oral Q24H  . benzonatate  100 mg Oral TID  .  dextromethorphan-guaiFENesin  1 tablet Oral BID  . enoxaparin (LOVENOX) injection  40 mg Subcutaneous Daily  . furosemide  40 mg Oral Daily  . insulin aspart  0-5 Units Subcutaneous QHS  . insulin aspart  0-9 Units Subcutaneous TID WC  . ipratropium-albuterol  3 mL Nebulization TID  . linagliptin  5 mg Oral Daily  . loratadine  10 mg Oral Daily  . methylPREDNISolone (SOLU-MEDROL) injection  60 mg Intravenous Q6H  . sodium chloride flush  3 mL Intravenous Q12H  . sodium chloride flush  3 mL Intravenous Q12H   Continuous Infusions: . sodium chloride 250 mL (12/22/18  4076)  . cefTRIAXone (ROCEPHIN)  IV 1 g (12/24/18 0911)    Assessment & Plan:    1.  Acute COPD exacerbation with viral bronchitis: Taper IV steroids and continue empiric antibiotics, taper O2 as tolerated.  Neb treatments PRN and scheduled.  Viral panel revealed positive for rhino/enterovirus but negative for flu.   2.  Leukocytosis: Secondary to problem #1.  Present on admission at 15,000 and improving.  Also on steroids currently  3.  Diabetes mellitus with hyperglycemia in the setting of IV steroids: Patient on Januvia at home.  Continue sliding scale insulin and substituted with Tradjenta while here.  4.  Chronic atrial fibrillation With SSS: Status post pacemaker.  CHADS-VASc 4 (age x2, DM, CHF). Rate is controlled.  Not on anticoagulation at baseline.  Patient states he was following Dr. Ladona Ridgel for cardiology and he had failed ablation x2.  He denies any bleeding episodes but states was told to discontinue anticoagulation after he got his pacemaker.  Recommend follow-up with primary cardiology upon discharge  5.  Chronic diastolic CHF: Compensated.  Received IV fluids in the ED for hypotension.  Currently normotensive and resumed home Lasix    DVT prophylaxis: Lovenox Code Status: Full code Family / Patient Communication: Discussed with patient Disposition Plan: Home when medically cleared Since respiratory  condition improved, will downgrade to telemetry    LOS: 2 days    Time spent: 35 minutes    Alessandra Bevels, MD [facility] Pager [phone]  If 7PM-7AM, please contact night-coverage [email or web address] Password [facility] 12/24/2018, 4:09 PM

## 2018-12-25 LAB — GLUCOSE, CAPILLARY
Glucose-Capillary: 192 mg/dL — ABNORMAL HIGH (ref 70–99)
Glucose-Capillary: 186 mg/dL — ABNORMAL HIGH (ref 70–99)
Glucose-Capillary: 198 mg/dL — ABNORMAL HIGH (ref 70–99)
Glucose-Capillary: 310 mg/dL — ABNORMAL HIGH (ref 70–99)

## 2018-12-25 LAB — PROCALCITONIN: Procalcitonin: 0.25 ng/mL

## 2018-12-25 MED ORDER — PANTOPRAZOLE SODIUM 40 MG PO TBEC
40.0000 mg | DELAYED_RELEASE_TABLET | Freq: Every morning | ORAL | Status: DC
  Administered 2018-12-25 – 2018-12-26 (×2): 40 mg via ORAL
  Filled 2018-12-25 (×2): qty 1

## 2018-12-25 MED ORDER — TAMSULOSIN HCL 0.4 MG PO CAPS
0.4000 mg | ORAL_CAPSULE | Freq: Every morning | ORAL | Status: DC
  Administered 2018-12-25 – 2018-12-26 (×2): 0.4 mg via ORAL
  Filled 2018-12-25 (×2): qty 1

## 2018-12-25 MED ORDER — TRAZODONE HCL 50 MG PO TABS: 50.0000 mg | ORAL_TABLET | Freq: Every evening | ORAL | Status: DC | PRN

## 2018-12-25 MED ORDER — BUDESONIDE 0.5 MG/2ML IN SUSP
0.5000 mg | Freq: Two times a day (BID) | RESPIRATORY_TRACT | Status: DC
  Administered 2018-12-25 – 2018-12-26 (×3): 0.5 mg via RESPIRATORY_TRACT
  Filled 2018-12-25 (×3): qty 2

## 2018-12-25 MED ORDER — POLYETHYLENE GLYCOL 3350 17 G PO PACK: 17.0000 g | PACK | Freq: Every day | ORAL | Status: DC | PRN

## 2018-12-25 MED ORDER — ATORVASTATIN CALCIUM 10 MG PO TABS
20.0000 mg | ORAL_TABLET | Freq: Every day | ORAL | Status: DC
  Administered 2018-12-25: 20 mg via ORAL
  Filled 2018-12-25: qty 2

## 2018-12-25 MED ORDER — FUROSEMIDE 40 MG PO TABS
40.0000 mg | ORAL_TABLET | Freq: Every day | ORAL | Status: DC
  Administered 2018-12-25: 40 mg via ORAL

## 2018-12-25 MED ORDER — CARVEDILOL 25 MG PO TABS
25.0000 mg | ORAL_TABLET | Freq: Two times a day (BID) | ORAL | Status: DC
  Administered 2018-12-25 – 2018-12-26 (×2): 25 mg via ORAL
  Filled 2018-12-25 (×2): qty 1

## 2018-12-25 MED ORDER — SENNOSIDES-DOCUSATE SODIUM 8.6-50 MG PO TABS: 2.0000 | ORAL_TABLET | Freq: Every evening | ORAL | Status: DC | PRN

## 2018-12-25 MED ORDER — LOSARTAN POTASSIUM 50 MG PO TABS
50.0000 mg | ORAL_TABLET | Freq: Every day | ORAL | Status: DC
  Administered 2018-12-25 – 2018-12-26 (×2): 50 mg via ORAL
  Filled 2018-12-25: qty 1

## 2018-12-25 MED ORDER — AMLODIPINE BESYLATE 10 MG PO TABS
10.0000 mg | ORAL_TABLET | Freq: Every day | ORAL | Status: DC
  Administered 2018-12-25 – 2018-12-26 (×2): 10 mg via ORAL
  Filled 2018-12-25 (×2): qty 1

## 2018-12-25 NOTE — Progress Notes (Signed)
At 0400 patient indicated having difficulty sleeping and needed O2 increased from 5L to 6L. Patient no respiratory distress, calm and alert. Patient did not describe any respiratory distress and stated just could not sleep. Informed patient SpO2 is 95-96% and trying to maintain 5L to wean. Inquired about patient's typical sleep patterns. Patient stated takes sleeping pills every night at home to sleep. Informed patient to speak with provider about sleeping pill and will inform day RN.   Notified by telemetry patient HR in 140's; RN not at bedside, when RN inquired about patient's activity during time of tachycardia, patient stated he was transferring to [facility]. Notified by tele patient pacemaker firing in incorrect areas. RN and charge RN went to patient's bedside. Patient resting comfortably, no distress, no complaints of pain. MD Bodenheimer notified and ordered EKG. Notified Bodeheimer EKG complete at [phone].   Per callback with Bodenheimer; pt. Pacemaker likely picking up wide QRS. Informed MD patient asymptomatic, no distress, no pain, A&O x4. Will continue to monitor and day RN notified.

## 2018-12-25 NOTE — Care Management Important Message (Signed)
Important Message  Patient Details  Name: Bradley COLMENERO Sr. MRN: [id number] Date of Birth: [birth year]   Medicare Important Message Given:  Yes    Anavi Branscum 12/25/2018, 4:26 PM

## 2018-12-25 NOTE — Progress Notes (Signed)
PROGRESS NOTE    Bradley GEROLD Sr.  SWH:[id number] DOB: 11/14/1940 DOA: 12/21/2018 PCP: [name], MD   Brief Narrative:  78 year old with history of atrial fibrillation on anticoagulation, pacemaker in place, chronic diastolic CHF, COPD on 2-3 L nasal cannula, diabetes mellitus type 2 came to the hospital with complaints of shortness of breath and cough.  He was also found to be hypoxic initially requiring BiPAP.  X-ray was negative for any infiltrates.  He was admitted to the hospital for COPD exacerbation with hypoxic respiratory failure.  Started on IV steroids and empiric antibiotics.   Assessment & Plan:   Principal Problem:   COPD with acute exacerbation (HCC) Active Problems:   DM II (diabetes mellitus, type II), controlled (HCC)   Essential hypertension   Permanent atrial fibrillation   Chronic diastolic heart failure (HCC)   Non-ischemic cardiomyopathy (HCC)   Sinoatrial node dysfunction (HCC)   Acute on chronic respiratory failure with hypoxia (HCC)   COPD (chronic obstructive pulmonary disease) (HCC)   Acute on chronic hypoxic respiratory distress, 3 L nasal cannula at home Acute COPD exacerbation with concerns of underlying viral bronchitis -Currently procalcitonin levels are unequivocal, BNP is in about 300 range.  Due to leukocytosis, likely from steroid use but will complete short course of antibiotics. -Aggressive bronchodilator treatment.  Will add Pulmicort, incentive spirometry and flutter valve.  IV Solu-Medrol -Supplemental oxygen as necessary.  Advised patient to ambulate in the room as much as possible. -Respiratory panel-negative  Diabetes mellitus type 2 -Continue home medications.  Insulin sliding scale and Accu-Chek.  Chronic atrial fibrillation with sick sinus syndrome status post pacemaker -Concerns for overnight tachycardia.  Pacemaker interrogated this morning.  Continue supportive care.  Monitor electrolytes.  Not on long-term anticoagulation.   Chronic diastolic congestive heart failure -Appears to be compensated.  Will resume Coreg 25 mg twice daily, losartan 50 mg daily.  Lasix 40 mg daily  Insomnia -We will add trazodone at bedtime  DVT prophylaxis: Lovenox Code Status: Full code Family Communication: None at bedside  disposition Plan: Patient still remains quite hypoxic with minimal movement.  Requiring aggressive bronchodilator treatment and IV Solu-Medrol in the hospital.  He will still be here for at least next 24-48 hours.  Consultants:   None  Procedures:   None  Antimicrobials:   Azithromycin day 2  Rocephin day 2   Subjective: Patient still reports of significant shortness of breath with minimal movement.  Still has some nonproductive cough.  Remains afebrile overnight.  Review of Systems Otherwise negative except as per HPI, including: General: Denies fever, chills, night sweats or unintended weight loss. Resp: Denies hemoptysis Cardiac: Denies chest pain, palpitations, orthopnea, paroxysmal nocturnal dyspnea. GI: Denies abdominal pain, nausea, vomiting, diarrhea or constipation GU: Denies dysuria, frequency, hesitancy or incontinence MS: Denies muscle aches, joint pain or swelling Neuro: Denies headache, neurologic deficits (focal weakness, numbness, tingling), abnormal gait Psych: Denies anxiety, depression, SI/HI/AVH Skin: Denies new rashes or lesions ID: Denies sick contacts, exotic exposures, travel  Objective: Vitals:   12/24/18 2149 12/24/18 2159 12/25/18 0513 12/25/18 0840  BP:   117/66   Pulse:  74 74   Resp:  18    Temp:   (!) 97.3 F (36.3 C)   TempSrc:   Oral   SpO2: 96% 95% 100% 98%  Weight:   93.5 kg   Height:        Intake/Output Summary (Last 24 hours) at 12/25/2018 1048 Last data filed at 12/25/2018 6659 Gross  per 24 hour  Intake 480 ml  Output 1225 ml  Net -745 ml   Filed Weights   12/23/18 0500 12/24/18 0536 12/25/18 0513  Weight: 90.8 kg 91.2 kg 93.5 kg     Examination:  General exam: Appears calm and comfortable, 3 L nasal cannula Respiratory system: Bilateral diffuse diminished breath sounds Cardiovascular system: S1 & S2 heard, RRR. No JVD, murmurs, rubs, gallops or clicks. No pedal edema. Gastrointestinal system: Abdomen is nondistended, soft and nontender. No organomegaly or masses felt. Normal bowel sounds heard. Central nervous system: Alert and oriented. No focal neurological deficits. Extremities: Symmetric 4+ x 5 power. Skin: No rashes, lesions or ulcers Psychiatry: Judgement and insight appear normal. Mood & affect appropriate.     Data Reviewed:   CBC: Recent Labs  Lab 12/21/18 2022 12/21/18 2030 12/21/18 2143 12/22/18 0237  WBC  --  15.0*  --  13.1*  NEUTROABS  --  12.2*  --  12.0*  HGB 13.6 14.3 13.6 13.7  HCT 40.0 44.1 40.0 40.8  MCV  --  92.5  --  91.7  PLT  --  189  --  170   Basic Metabolic Panel: Recent Labs  Lab 12/21/18 2022 12/21/18 2030 12/21/18 2143 12/22/18 0237  NA 138 137 137 137  K 4.2 4.4 4.3 4.3  CL  --  104  --  106  CO2  --  27  --  25  GLUCOSE  --  153*  --  130*  BUN  --  14  --  14  CREATININE  --  0.96  --  0.91  CALCIUM  --  8.4*  --  8.4*   GFR: Estimated Creatinine Clearance: 76.7 mL/min (by C-G formula based on SCr of 0.91 mg/dL). Liver Function Tests: No results for input(s): AST, ALT, ALKPHOS, BILITOT, PROT, ALBUMIN in the last 168 hours. No results for input(s): LIPASE, AMYLASE in the last 168 hours. No results for input(s): AMMONIA in the last 168 hours. Coagulation Profile: No results for input(s): INR, PROTIME in the last 168 hours. Cardiac Enzymes: Recent Labs  Lab 12/21/18 2030  TROPONINI <0.03   BNP (last 3 results) No results for input(s): PROBNP in the last 8760 hours. HbA1C: No results for input(s): HGBA1C in the last 72 hours. CBG: Recent Labs  Lab 12/24/18 1200 12/24/18 1720 12/24/18 2043 12/24/18 2248 12/25/18 0801  GLUCAP 267* 280* 328* 247*  192*   Lipid Profile: No results for input(s): CHOL, HDL, LDLCALC, TRIG, CHOLHDL, LDLDIRECT in the last 72 hours. Thyroid Function Tests: No results for input(s): TSH, T4TOTAL, FREET4, T3FREE, THYROIDAB in the last 72 hours. Anemia Panel: No results for input(s): VITAMINB12, FOLATE, FERRITIN, TIBC, IRON, RETICCTPCT in the last 72 hours. Sepsis Labs: Recent Labs  Lab 12/25/18 0818  PROCALCITON 0.25    Recent Results (from the past 240 hour(s))  MRSA PCR Screening     Status: None   Collection Time: 12/22/18  8:18 AM  Result Value Ref Range Status   MRSA by PCR NEGATIVE NEGATIVE Final    Comment:        The GeneXpert MRSA Assay (FDA approved for NASAL specimens only), is one component of a comprehensive MRSA colonization surveillance program. It is not intended to diagnose MRSA infection nor to guide or monitor treatment for MRSA infections. Performed at [facility], [address] [place], [place] [address]   Respiratory Panel by PCR     Status: None   Collection Time: 12/22/18  9:05 AM  Result Value Ref Range Status   Adenovirus NOT DETECTED NOT DETECTED Final   Coronavirus 229E NOT DETECTED NOT DETECTED Final    Comment: (NOTE) The Coronavirus on the Respiratory Panel, DOES NOT test for the novel  Coronavirus (2019 nCoV)    Coronavirus HKU1 NOT DETECTED NOT DETECTED Final   Coronavirus NL63 NOT DETECTED NOT DETECTED Final   Coronavirus OC43 NOT DETECTED NOT DETECTED Final   Metapneumovirus NOT DETECTED NOT DETECTED Final   Rhinovirus / Enterovirus NOT DETECTED NOT DETECTED Final   Influenza A NOT DETECTED NOT DETECTED Final   Influenza B NOT DETECTED NOT DETECTED Final   Parainfluenza Virus 1 NOT DETECTED NOT DETECTED Final   Parainfluenza Virus 2 NOT DETECTED NOT DETECTED Final   Parainfluenza Virus 3 NOT DETECTED NOT DETECTED Final   Parainfluenza Virus 4 NOT DETECTED NOT DETECTED Final   Respiratory Syncytial Virus NOT DETECTED NOT DETECTED Final    Bordetella pertussis NOT DETECTED NOT DETECTED Final   Chlamydophila pneumoniae NOT DETECTED NOT DETECTED Final   Mycoplasma pneumoniae NOT DETECTED NOT DETECTED Final    Comment: Performed at [facility], [address] [place], [place] [address]         Radiology Studies: No results found.      Scheduled Meds: . azithromycin  250 mg Oral Q24H  . benzonatate  100 mg Oral TID  . budesonide (PULMICORT) nebulizer solution  0.5 mg Nebulization BID  . dextromethorphan-guaiFENesin  1 tablet Oral BID  . enoxaparin (LOVENOX) injection  40 mg Subcutaneous Daily  . furosemide  40 mg Oral Daily  . insulin aspart  0-5 Units Subcutaneous QHS  . insulin aspart  0-9 Units Subcutaneous TID WC  . ipratropium-albuterol  3 mL Nebulization TID  . linagliptin  5 mg Oral Daily  . loratadine  10 mg Oral Daily  . methylPREDNISolone (SOLU-MEDROL) injection  40 mg Intravenous Q12H  . sodium chloride flush  3 mL Intravenous Q12H  . sodium chloride flush  3 mL Intravenous Q12H   Continuous Infusions: . sodium chloride 250 mL (12/22/18 0856)  . cefTRIAXone (ROCEPHIN)  IV 1 g (12/25/18 0825)     LOS: 3 days   Time spent= 35 mins    Jowanna Loeffler Joline Maxcy, MD [facility]  If 7PM-7AM, please contact night-coverage [email or web address] 12/25/2018, 10:48 AM

## 2018-12-26 LAB — BASIC METABOLIC PANEL
Anion gap: 6 (ref 5–15)
BUN: 21 mg/dL (ref 8–23)
CO2: 30 mmol/L (ref 22–32)
Calcium: 8.2 mg/dL — ABNORMAL LOW (ref 8.9–10.3)
Chloride: 99 mmol/L (ref 98–111)
Creatinine, Ser: 0.78 mg/dL (ref 0.61–1.24)
GFR calc Af Amer: >60 mL/min (ref >60)
GFR calc non Af Amer: >60 mL/min (ref >60)
Glucose, Bld: 214 mg/dL — ABNORMAL HIGH (ref 70–99)
Potassium: 4.5 mmol/L (ref 3.5–5.1)
Sodium: 135 mmol/L (ref 135–145)

## 2018-12-26 LAB — CBC
HCT: 38.6 % — ABNORMAL LOW (ref 39.0–52.0)
Hemoglobin: 13.2 g/dL (ref 13.0–17.0)
MCH: 30.8 pg (ref 26.0–34.0)
MCHC: 34.2 g/dL (ref 30.0–36.0)
MCV: 90 fL (ref 80.0–100.0)
Platelets: 153 10*3/uL (ref 150–400)
RBC: 4.29 MIL/uL (ref 4.22–5.81)
RDW: 12.9 % (ref 11.5–15.5)
WBC: 6.2 10*3/uL (ref 4.0–10.5)
nRBC: 0 % (ref 0.0–0.2)

## 2018-12-26 LAB — GLUCOSE, CAPILLARY: Glucose-Capillary: 214 mg/dL — ABNORMAL HIGH (ref 70–99)

## 2018-12-26 LAB — MAGNESIUM: Magnesium: 2.1 mg/dL (ref 1.7–2.4)

## 2018-12-26 MED ORDER — AZITHROMYCIN 250 MG PO TABS: 250.0000 mg | ORAL_TABLET | ORAL | 0 refills | Status: AC

## 2018-12-26 MED ORDER — BUDESONIDE 0.5 MG/2ML IN SUSP: 0.5000 mg | Freq: Two times a day (BID) | RESPIRATORY_TRACT | 0 refills | Status: DC

## 2018-12-26 MED ORDER — PREDNISONE 50 MG PO TABS: 50.0000 mg | ORAL_TABLET | Freq: Every day | ORAL | 0 refills | Status: AC

## 2018-12-26 NOTE — Progress Notes (Signed)
Pt given discharge instructions, prescriptions, and care notes. Pt verbalized understanding AEB no further questions or concerns at this time. IV was discontinued, no redness, pain, or swelling noted at this time. Telemetry discontinued and Centralized Telemetry was notified. Pt left the floor via wheelchair with staff in stable condition. Skin dry and intact. Pt in no distress.

## 2018-12-26 NOTE — Progress Notes (Signed)
SATURATION QUALIFICATIONS: (This note is used to comply with regulatory documentation for home oxygen)  Patient Saturations on Room Air at Rest = 92%  Patient Saturations on Room Air while Ambulating = 77%  Patient Saturations on 2 Liters of oxygen while Ambulating = 93%  Please briefly explain why patient needs home oxygen: Pt's O2 Saturation decreases while ambulating on room air.

## 2018-12-26 NOTE — Discharge Summary (Signed)
Physician Discharge Summary  WHIT TONES Sr. LHT:[id number] DOB: [birth year] DOA: 12/21/2018  PCP: [name], MD  Admit date: 12/21/2018 Discharge date: 12/26/2018  Admitted From: Home Disposition: Home  Recommendations for Outpatient Follow-up:  1. Follow up with PCP in 1-2 weeks 2. Please obtain BMP/CBC in one week your next doctors visit.  3. Oral azithromycin for 2 days.  Oral prednisone for 3 days 4. Pulmicort prescribed   Discharge Condition: Stable CODE STATUS: Full code Diet recommendation: Cardiac  Brief/Interim Summary: 78 year old with history of atrial fibrillation on anticoagulation, pacemaker in place, chronic diastolic CHF, COPD on 2-3 L nasal cannula, diabetes mellitus type 2 came to the hospital with complaints of shortness of breath and cough.  He was also found to be hypoxic initially requiring BiPAP.  X-ray was negative for any infiltrates.  He was admitted to the hospital for COPD exacerbation with hypoxic respiratory failure.  Started on IV steroids, bronchodilators and empiric antibiotics.  Over the course of few days patient started doing much better and symptomatically was feeling much better.  He was able to ambulate in the hallway without much issue.  He had to use his 2 L nasal cannula even with ambulation.  On room air he desaturates down to 77%.  Overall feels much better therefore we will discharge him in stable condition with outpatient follow-up recommendations as stated above.   Discharge Diagnoses:  Principal Problem:   COPD with acute exacerbation (HCC) Active Problems:   DM II (diabetes mellitus, type II), controlled (HCC)   Essential hypertension   Permanent atrial fibrillation   Chronic diastolic heart failure (HCC)   Non-ischemic cardiomyopathy (HCC)   Sinoatrial node dysfunction (HCC)   Acute on chronic respiratory failure with hypoxia (HCC)   COPD (chronic obstructive pulmonary disease) (HCC)   Acute on chronic hypoxic respiratory  distress, 3 L nasal cannula at home, greatly improved Acute COPD exacerbation with concerns of underlying viral bronchitis -Currently procalcitonin levels are unequivocal, BNP is in about 300 range.  Due to leukocytosis, transition IV Solu-Medrol to oral prednisone for 3 more days.  2 more days of oral azithromycin. -Advised to continue using home bronchodilator treatment.  Will prescribe Pulmicort.  Advised to take incentive spirometry and flutter valve with him. -Supplemental oxygen as necessary.  On ambulatory pulse ox 2 L nasal cannula oxygen saturation remains greater than 90% -Respiratory panel-negative  Diabetes mellitus type 2 Continue home meds  Chronic atrial fibrillation with sick sinus syndrome status post pacemaker -Follow-up outpatient with cardiology  Chronic diastolic congestive heart failure -Appears to be compensated.  Will resume Coreg 25 mg twice daily, losartan 50 mg daily.  Lasix 40 mg daily  Insomnia -We will add trazodone at bedtime  DVT prophylaxis: Lovenox Code Status: Full code Family Communication: None at bedside  disposition Plan: Patient still remains quite hypoxic with minimal movement.  Requiring aggressive bronchodilator treatment and IV Solu-Medrol in the hospital.  He will still be here for at least next 24-48 hours.  Consultations:  None  Subjective: Feels much better than yesterday.  Ambulating with 2 L nasal cannula maintaining saturation greater than 90%.  No other complaints.  Discharge Exam: Vitals:   12/26/18 0752 12/26/18 0756  BP:    Pulse:    Resp:    Temp:    SpO2: 92% 98%   Vitals:   12/25/18 2112 12/26/18 0520 12/26/18 0752 12/26/18 0756  BP:  126/78    Pulse:  75    Resp:  Temp:  97.8 F (36.6 C)    TempSrc:  Oral    SpO2: 96% 94% 92% 98%  Weight:      Height:        General: Pt is alert, awake, not in acute distress, 2 L nasal cannula Cardiovascular: RRR, S1/S2 +, no rubs, no gallops Respiratory:  Slightly diminished breath sounds diffusely but greatly improved from yesterday. Abdominal: Soft, NT, ND, bowel sounds + Extremities: no edema, no cyanosis  Discharge Instructions   Allergies as of 12/26/2018      Reactions   Hydrocodone Other (See Comments)   "given to him in the hospital; went thru withdrawals once home; dr said not to take it again" (09/27/2012)   Tramadol Other (See Comments)   "given to him in the hospital; went thru withdrawals once home; dr said not to take it again" (09/27/2012)   Tadalafil Other (See Comments)   headache, backache   Avelox [moxifloxacin Hydrochloride] Itching, Other (See Comments)   Headache      Medication List    TAKE these medications   amLODipine 10 MG tablet Commonly known as:  NORVASC Take 1 tablet (10 mg total) by mouth daily.   aspirin 81 MG tablet Take 81 mg by mouth every morning.   atorvastatin 20 MG tablet Commonly known as:  LIPITOR Take 1 tablet (20 mg total) by mouth at bedtime.   azithromycin 250 MG tablet Commonly known as:  ZITHROMAX Take 1 tablet (250 mg total) by mouth daily for 2 days.   budesonide 0.5 MG/2ML nebulizer solution Commonly known as:  PULMICORT Take 2 mLs (0.5 mg total) by nebulization 2 (two) times daily for 30 days.   calcium-vitamin D 500-200 MG-UNIT tablet Commonly known as:  OSCAL WITH D Take 1 tablet by mouth every morning.   carvedilol 25 MG tablet Commonly known as:  COREG Take 1 tablet (25 mg total) by mouth 2 (two) times daily with a meal.   furosemide 40 MG tablet Commonly known as:  Lasix Take 1 tablet (40 mg total) by mouth daily.   ipratropium-albuterol 0.5-2.5 (3) MG/3ML Soln Commonly known as:  DUONEB Take 3 mLs by nebulization 2 (two) times daily as needed.   losartan 100 MG tablet Commonly known as:  COZAAR Take 0.5 tablets (50 mg total) by mouth daily. What changed:  when to take this   multivitamins ther. w/minerals Tabs tablet Take 1 tablet by mouth daily.    nitroGLYCERIN 0.4 MG SL tablet Commonly known as:  NITROSTAT DISSOLVE 1 TABLET UNDER THE TONGUE EVERY 5 MINUTES AS NEEDED FOR CHEST PAIN What changed:  See the new instructions.   onetouch ultrasoft lancets Check blood sugars no more than twice daily   OneTouch Verio test strip Generic drug:  glucose blood CHECK BLOOD SUGAR NO MORE THAN TWICE A DAY   pantoprazole 40 MG tablet Commonly known as:  PROTONIX Take 1 tablet (40 mg total) by mouth every morning.   predniSONE 50 MG tablet Commonly known as:  DELTASONE Take 1 tablet (50 mg total) by mouth daily for 3 days.   sitaGLIPtin 100 MG tablet Commonly known as:  Januvia Take 1 tablet (100 mg total) by mouth daily. What changed:  when to take this   tamsulosin 0.4 MG Caps capsule Commonly known as:  FLOMAX Take 1 capsule (0.4 mg total) by mouth daily. What changed:  when to take this   zolpidem 10 MG tablet Commonly known as:  AMBIEN Take 0.5 tablets (5 mg  total) by mouth at bedtime as needed for sleep.       Allergies  Allergen Reactions  . Hydrocodone Other (See Comments)    "given to him in the hospital; went thru withdrawals once home; dr said not to take it again" (09/27/2012)  . Tramadol Other (See Comments)    "given to him in the hospital; went thru withdrawals once home; dr said not to take it again" (09/27/2012)  . Tadalafil Other (See Comments)    headache, backache  . Avelox [Moxifloxacin Hydrochloride] Itching and Other (See Comments)    Headache    You were cared for by a hospitalist during your hospital stay. If you have any questions about your discharge medications or the care you received while you were in the hospital after you are discharged, you can call the unit and asked to speak with the hospitalist on call if the hospitalist that took care of you is not available. Once you are discharged, your primary care physician will handle any further medical issues. Please note that no refills for any  discharge medications will be authorized once you are discharged, as it is imperative that you return to your primary care physician (or establish a relationship with a primary care physician if you do not have one) for your aftercare needs so that they can reassess your need for medications and monitor your lab values.   Procedures/Studies: Dg Chest Port 1 View  Result Date: 12/21/2018 CLINICAL DATA:  78 year old male with shortness of breath. EXAM: PORTABLE CHEST 1 VIEW COMPARISON:  09/24/2018 and earlier. FINDINGS: Portable AP semi upright view at 1935 hours. Stable lung volumes and cardiomegaly. Other mediastinal contours are within normal limits. Visualized tracheal air column is within normal limits. Bilateral peripheral and basilar predominant pulmonary interstitial opacity appears stable. No pneumothorax, pleural effusion or acute pulmonary opacity. Negative visible bowel gas pattern. IMPRESSION: No acute cardiopulmonary abnormality. Stable cardiomegaly and chronic pulmonary interstitial changes. Electronically Signed   By: Odessa Fleming M.D.   On: 12/21/2018 19:49     The results of significant diagnostics from this hospitalization (including imaging, microbiology, ancillary and laboratory) are listed below for reference.     Microbiology: Recent Results (from the past 240 hour(s))  MRSA PCR Screening     Status: None   Collection Time: 12/22/18  8:18 AM  Result Value Ref Range Status   MRSA by PCR NEGATIVE NEGATIVE Final    Comment:        The GeneXpert MRSA Assay (FDA approved for NASAL specimens only), is one component of a comprehensive MRSA colonization surveillance program. It is not intended to diagnose MRSA infection nor to guide or monitor treatment for MRSA infections. Performed at [facility], [address], [place], [place] [address]   Respiratory Panel by PCR     Status: None   Collection Time: 12/22/18  9:05 AM  Result Value Ref Range Status   Adenovirus NOT  DETECTED NOT DETECTED Final   Coronavirus 229E NOT DETECTED NOT DETECTED Final    Comment: (NOTE) The Coronavirus on the Respiratory Panel, DOES NOT test for the novel  Coronavirus (2019 nCoV)    Coronavirus HKU1 NOT DETECTED NOT DETECTED Final   Coronavirus NL63 NOT DETECTED NOT DETECTED Final   Coronavirus OC43 NOT DETECTED NOT DETECTED Final   Metapneumovirus NOT DETECTED NOT DETECTED Final   Rhinovirus / Enterovirus NOT DETECTED NOT DETECTED Final   Influenza A NOT DETECTED NOT DETECTED Final   Influenza B  NOT DETECTED NOT DETECTED Final   Parainfluenza Virus 1 NOT DETECTED NOT DETECTED Final   Parainfluenza Virus 2 NOT DETECTED NOT DETECTED Final   Parainfluenza Virus 3 NOT DETECTED NOT DETECTED Final   Parainfluenza Virus 4 NOT DETECTED NOT DETECTED Final   Respiratory Syncytial Virus NOT DETECTED NOT DETECTED Final   Bordetella pertussis NOT DETECTED NOT DETECTED Final   Chlamydophila pneumoniae NOT DETECTED NOT DETECTED Final   Mycoplasma pneumoniae NOT DETECTED NOT DETECTED Final    Comment: Performed at [facility], [address] [place], [place] [address]     Labs: BNP (last 3 results) Recent Labs    05/25/18 0842 09/19/18 1219 12/21/18 2030  BNP 307.9* 324.5* 237.9*   Basic Metabolic Panel: Recent Labs  Lab 12/21/18 2022 12/21/18 2030 12/21/18 2143 12/22/18 0237 12/26/18 0255  NA 138 137 137 137 135  K 4.2 4.4 4.3 4.3 4.5  CL  --  104  --  106 99  CO2  --  27  --  25 30  GLUCOSE  --  153*  --  130* 214*  BUN  --  14  --  14 21  CREATININE  --  0.96  --  0.91 0.78  CALCIUM  --  8.4*  --  8.4* 8.2*  MG  --   --   --   --  2.1   Liver Function Tests: No results for input(s): AST, ALT, ALKPHOS, BILITOT, PROT, ALBUMIN in the last 168 hours. No results for input(s): LIPASE, AMYLASE in the last 168 hours. No results for input(s): AMMONIA in the last 168 hours. CBC: Recent Labs  Lab 12/21/18 2022 12/21/18 2030 12/21/18 2143 12/22/18 0237  12/26/18 0255  WBC  --  15.0*  --  13.1* 6.2  NEUTROABS  --  12.2*  --  12.0*  --   HGB 13.6 14.3 13.6 13.7 13.2  HCT 40.0 44.1 40.0 40.8 38.6*  MCV  --  92.5  --  91.7 90.0  PLT  --  189  --  170 153   Cardiac Enzymes: Recent Labs  Lab 12/21/18 2030  TROPONINI <0.03   BNP: Invalid input(s): POCBNP CBG: Recent Labs  Lab 12/25/18 0801 12/25/18 1206 12/25/18 1608 12/25/18 2103 12/26/18 0741  GLUCAP 192* 186* 198* 310* 214*   D-Dimer No results for input(s): DDIMER in the last 72 hours. Hgb A1c No results for input(s): HGBA1C in the last 72 hours. Lipid Profile No results for input(s): CHOL, HDL, LDLCALC, TRIG, CHOLHDL, LDLDIRECT in the last 72 hours. Thyroid function studies No results for input(s): TSH, T4TOTAL, T3FREE, THYROIDAB in the last 72 hours.  Invalid input(s): FREET3 Anemia work up No results for input(s): VITAMINB12, FOLATE, FERRITIN, TIBC, IRON, RETICCTPCT in the last 72 hours. Urinalysis    Component Value Date/Time   COLORURINE YELLOW 07/15/2015 1440   APPEARANCEUR CLEAR 07/15/2015 1440   LABSPEC 1.015 07/15/2015 1440   PHURINE 6.0 07/15/2015 1440   GLUCOSEU NEGATIVE 07/15/2015 1440   HGBUR TRACE-LYSED (A) 07/15/2015 1440   BILIRUBINUR NEGATIVE 07/15/2015 1440   KETONESUR TRACE (A) 07/15/2015 1440   PROTEINUR NEGATIVE 07/06/2012 0341   UROBILINOGEN 1.0 07/15/2015 1440   NITRITE NEGATIVE 07/15/2015 1440   LEUKOCYTESUR NEGATIVE 07/15/2015 1440   Sepsis Labs Invalid input(s): PROCALCITONIN,  WBC,  LACTICIDVEN Microbiology Recent Results (from the past 240 hour(s))  MRSA PCR Screening     Status: None   Collection Time: 12/22/18  8:18 AM  Result Value Ref Range Status  MRSA by PCR NEGATIVE NEGATIVE Final    Comment:        The GeneXpert MRSA Assay (FDA approved for NASAL specimens only), is one component of a comprehensive MRSA colonization surveillance program. It is not intended to diagnose MRSA infection nor to guide or monitor  treatment for MRSA infections. Performed at [facility], [address] [place], [place] [address]   Respiratory Panel by PCR     Status: None   Collection Time: 12/22/18  9:05 AM  Result Value Ref Range Status   Adenovirus NOT DETECTED NOT DETECTED Final   Coronavirus 229E NOT DETECTED NOT DETECTED Final    Comment: (NOTE) The Coronavirus on the Respiratory Panel, DOES NOT test for the novel  Coronavirus (2019 nCoV)    Coronavirus HKU1 NOT DETECTED NOT DETECTED Final   Coronavirus NL63 NOT DETECTED NOT DETECTED Final   Coronavirus OC43 NOT DETECTED NOT DETECTED Final   Metapneumovirus NOT DETECTED NOT DETECTED Final   Rhinovirus / Enterovirus NOT DETECTED NOT DETECTED Final   Influenza A NOT DETECTED NOT DETECTED Final   Influenza B NOT DETECTED NOT DETECTED Final   Parainfluenza Virus 1 NOT DETECTED NOT DETECTED Final   Parainfluenza Virus 2 NOT DETECTED NOT DETECTED Final   Parainfluenza Virus 3 NOT DETECTED NOT DETECTED Final   Parainfluenza Virus 4 NOT DETECTED NOT DETECTED Final   Respiratory Syncytial Virus NOT DETECTED NOT DETECTED Final   Bordetella pertussis NOT DETECTED NOT DETECTED Final   Chlamydophila pneumoniae NOT DETECTED NOT DETECTED Final   Mycoplasma pneumoniae NOT DETECTED NOT DETECTED Final    Comment: Performed at [facility], [address] [place], [place] [address]     Time coordinating discharge:  I have spent 35 minutes face to face with the patient and on the ward discussing the patients care, assessment, plan and disposition with other care givers. >50% of the time was devoted counseling the patient about the risks and benefits of treatment/Discharge disposition and coordinating care.   SIGNED:   Dimple Nanas, MD  [facility] 12/26/2018, 10:43 AM   If 7PM-7AM, please contact night-coverage [email or web address]

## 2018-12-27 ENCOUNTER — Telehealth: Payer: Self-pay | Admitting: Internal Medicine

## 2018-12-27 DIAGNOSIS — J9621 Acute and chronic respiratory failure with hypoxia: Secondary | ICD-10-CM

## 2018-12-27 NOTE — Telephone Encounter (Signed)
Transition Care Management Follow-up Telephone Call  ADMISSION DATE: 12/21/18  DISCHARGE DATE: 12/26/18  PATIENT TO HAVE BMP AND CBC Collected.  How have you been since you were released from the hospital? Patient states he feels much beter.  Do you understand why you were in the hospital? Yes   Do you understand the discharge instrcutions? Yes  Items Reviewed: Medications reviewed: Yes  Allergies reviewed:Yes  Dietary changes reviewed: Diabetic Heart healthy  Referrals reviewed: Cardiology. Appointment has been scheduled with [name]  Functional Questionnaire:  Activities of Daily Living (ADLs): Patient states he can perform all independently.  Any patient concerns? Patient states his sleep medication is not helping him.   Confirmed importance and date/time of follow-up visits scheduled:Yes   Confirmed with patient if condition begins to worsen call PCP or go to the ER. Yes    Patient was given the office number and encouragred to call back with questions or concerns. Yes

## 2018-12-27 NOTE — Telephone Encounter (Signed)
Please call the patient, needs hospital follow-up. Get BMP and CBC in about a week Needs office visit, 2 weeks from now, make an appointment but at the same timeadvised patient to call ahead of time to see if it is safe to come to the office due to the coronavirus outbreak.

## 2018-12-28 ENCOUNTER — Inpatient Hospital Stay: Payer: Medicare Other | Admitting: Internal Medicine

## 2018-12-28 NOTE — Telephone Encounter (Signed)
Spoke w/ Pt and [name], his wife, informed of PCP recommendation to postpone appt several weeks. Lab appt scheduled for 01/04/2019 and hosp f/u scheduled 01/16/2019. To let us know if he becomes symptomatic before next appt.

## 2018-12-28 NOTE — Addendum Note (Signed)
Addended byConrad Irwin D on: 12/28/2018 08:14 AM   Modules accepted: Orders

## 2019-01-02 ENCOUNTER — Telehealth: Payer: Self-pay

## 2019-01-02 ENCOUNTER — Encounter: Payer: Self-pay | Admitting: *Deleted

## 2019-01-02 NOTE — Telephone Encounter (Signed)
Yes, needs labs only.

## 2019-01-02 NOTE — Telephone Encounter (Signed)
Copied from CRM [id number]. Topic: General - Inquiry >> Jan 02, 2019  1:12 PM Mickel Baas B, [place] wrote: Reason for CRM: Patient would like to know if Dr Drue Novel is needing him to come to his lab appointment on 01/04/2019? PLease advise.

## 2019-01-03 ENCOUNTER — Encounter: Payer: Self-pay | Admitting: Cardiovascular Disease

## 2019-01-03 ENCOUNTER — Telehealth (INDEPENDENT_AMBULATORY_CARE_PROVIDER_SITE_OTHER): Payer: Medicare Other | Admitting: Cardiovascular Disease

## 2019-01-03 ENCOUNTER — Telehealth: Payer: Self-pay

## 2019-01-03 VITALS — BP 129/74 | HR 81

## 2019-01-03 DIAGNOSIS — J9611 Chronic respiratory failure with hypoxia: Secondary | ICD-10-CM

## 2019-01-03 DIAGNOSIS — I5032 Chronic diastolic (congestive) heart failure: Secondary | ICD-10-CM | POA: Diagnosis not present

## 2019-01-03 DIAGNOSIS — I482 Chronic atrial fibrillation, unspecified: Secondary | ICD-10-CM

## 2019-01-03 DIAGNOSIS — Z79899 Other long term (current) drug therapy: Secondary | ICD-10-CM

## 2019-01-03 DIAGNOSIS — I959 Hypotension, unspecified: Secondary | ICD-10-CM

## 2019-01-03 DIAGNOSIS — I10 Essential (primary) hypertension: Secondary | ICD-10-CM

## 2019-01-03 DIAGNOSIS — I952 Hypotension due to drugs: Secondary | ICD-10-CM

## 2019-01-03 NOTE — Progress Notes (Signed)
Virtual Visit via Video Note    Evaluation Performed:  Follow-up visit  This visit type was conducted due to national recommendations for restrictions regarding the COVID-19 Pandemic (e.g. social distancing).  This format is felt to be most appropriate for this patient at this time.  All issues noted in this document were discussed and addressed.  No physical exam was performed (except for noted visual exam findings with Video Visits).  Please refer to the patient's chart (MyChart message for video visits and phone note for telephone visits) for the patient's consent to telehealth for [facility].  Date:  01/03/2019   ID:  Bradley Searles Sr., DOB 1941-06-10, MRN [id number]  Patient Location:  [address] [place] [address]   Provider location:   [address] [place], [place]  PCP:  [name], MD  Cardiologist:  [name], MD  Electrophysiologist:  None   Chief Complaint:  Follow up  History of Present Illness:    Bradley DELROSARIO Sr. is a 78 y.o. male who presents via video conferencing for a telehealth visit today.   Mr. Meldrick Vandenbush. is a 78 y.o. male with chronic atrial fibrillation s/p AVN ablation and CRT-P, chronic diastolic heart failure, mild aortic stenosis, CAD, hypertension, hyperlipidemia, prior ICH (no anticoagulation), and PAD who presents for follow-up.  He previously underwent left heart catheterization in 2005 and in 2018 and had no significant coronary artery disease. He had a BiV pacemaker and AV nodal ablation 09/08/11. At that time he had reduced systolic function in the setting of atrial fibrillation with RVR. After placement of the biventricular pacemaker his ejection fraction improved to 50-55% in 2017.  He was admitted 02/2017 with COPD exacerbation and acute on chronic diastolic heart failure. BNP was 491 and TEE had pleural effusions on chest x-ray. He improved with diuresis. Echocardiogram at that time revealed LVEF 55-60%, mild mitral  regurgitation, and PA SP 55 mmHg.  He followed up with [name] on 03/09/17 and was doing well.  Since his last appointment Bradley [name] was admitted 12/2018 with a COPD exacerbation  He developed increasing shortness of breath and cough.  In the hospital he required BiPAP, IV steroids and antibiotics.  He was up to 8L in the hospital and is now down to 2.5L.  His baseline is 2L.  Since being discharged he has been feeling well.  His breathing is nearly back to baseline.  He has no chest pain, edema, orthopnea, PND, palpitations or dizzienss.  He is scheduled to follow up with Dr. Drue [name] later this week.  He has been checking his BP at home and it has been in the 120s/70s.  His last remote transmission was 11/2018 and his device was functioning properly.   The patient does not symptoms concerning for COVID-19 infection (fever, chills, cough, or new SHORTNESS OF BREATH).    Prior CV studies:   The following studies were reviewed today:  Echo 05/26/18: Study Conclusions  - Left ventricle: The cavity size was normal. Wall thickness was normal. Systolic function was normal. The estimated ejection fraction was in the range of 55% to 60%. Although no diagnostic regional wall motion abnormality was identified, this possibility cannot be completely excluded on the basis of this study. The study was not technically sufficient to allow evaluation of LV diastolic dysfunction due to atrial fibrillation. - Aortic valve: There was no stenosis. - Mitral valve: Moderately calcified annulus. There was trivial regurgitation. - Left atrium: The atrium  was mildly dilated. - Right ventricle: The cavity size was moderately dilated. Pacer wire or catheter noted in right ventricle. Systolic function was normal. - Right atrium: The atrium was severely dilated. - Tricuspid valve: Peak RV-RA gradient (S): 32 mm Hg. - Pulmonary arteries: PA peak pressure: 40 mm Hg (S). - Systemic veins: IVC measured  2.2 cm with > 50% respirophasic variation, suggesting RA pressure 8 mmHg.  Impressions:  - The patient appeared to be in atrial fibrillation. Normal LV size and systolic function, EF 55-60%. Moderately dilated RV with normal systolic function. Mild pulmonary hypertension. Biatrial enlargement. No significant valvular abnormalities.   Past Medical History:  Diagnosis Date   Abnormal CT scan, chest 2012   CT chest, several lymphadenopathies. Sees pulmonary   Anemia    intermittent   Arthritis    "?back" (09/19/2018)   Atrial fibrillation (HCC)    s/p AV node ablation & BiV PPM implantation 09/08/11 (op dictation pending)   BPH (benign prostatic hyperplasia)    Saw Dr [name] 2004, normal renal u/s   Bronchitis 08/26/2017   CAD (coronary artery disease)    CHF (congestive heart failure) (HCC)    Thought primarily to be non-systolic although EF down (EF 65-78% 12/2010, down to 35-40% 09/05/11), cath 2008 with no CAD, nuclear study 07/2011 showing Small area of reversibility in the distal ant/lat wall the left ventricle suspicious for ischemia/septal wall HK but felt to be low risk  (per D/C Summary 07/2011)   Chronic bronchitis (HCC)    "get it ~ q yr"   Heart murmur    HLD (hyperlipidemia)    HTN (hypertension)    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   Pleural effusion 2008   S/p decortication   Pulmonary HTN (HCC)    per cath 2008   Type II diabetes mellitus (HCC) 1999   Past Surgical History:  Procedure Laterality Date   BI-VENTRICULAR PACEMAKER INSERTION Left 09/08/2011   Procedure: BI-VENTRICULAR PACEMAKER INSERTION (CRT-P);  Surgeon: Marinus Maw, MD;  Location: [facility] CATH LAB;  Service: Cardiovascular;  Laterality: Left;   CARDIAC CATHETERIZATION      "couple times; never had balloon or stent" (09/19/2018)   CATARACT EXTRACTION W/ INTRAOCULAR LENS  IMPLANT, BILATERAL Bilateral 08/2018   COLONOSCOPY  03/10/11   normal   INSERT / REPLACE / REMOVE PACEMAKER  09/08/11   pacemaker placement   LUNG DECORTICATION     PLEURAL SCARIFICATION     pneumothorax with fibrothorax  ~ 2010   TONSILLECTOMY     "as a kid"      No outpatient medications have been marked as taking for the 01/03/19 encounter (Telemedicine) with [name], MD.      Allergies:   Hydrocodone; Tramadol; Tadalafil; and Avelox [moxifloxacin hydrochloride]   Social History   Tobacco Use   Smoking status: Former Smoker    Packs/day: 2.50    Years: 25.00    Pack years: 62.50    Types: Cigarettes, Cigars    Last attempt to quit: 10/11/1980    Years since quitting: 38.2   Smokeless tobacco: Never Used  Substance Use Topics   Alcohol use: Yes    Comment: 1 can of beer ever 4-5 months   Drug use: No  Family Hx: The patient's family history includes Breast cancer in his maternal aunt; Coronary artery disease in his father; Diabetes in his father; Drug abuse in his son; Polycythemia in his mother. There is no history of Prostate cancer or Colon cancer.  ROS:   Please see the history of present illness.    All other systems reviewed and are negative.   Labs/Other Tests and Data Reviewed:    Recent Labs: 06/07/2018: TSH 1.61 09/19/2018: ALT 15 12/21/2018: B Natriuretic Peptide 237.9 12/26/2018: BUN 21; Creatinine, Ser 0.78; Hemoglobin 13.2; Magnesium 2.1; Platelets 153; Potassium 4.5; Sodium 135   Recent Lipid Panel Lab Results  Component Value Date/Time   CHOL 105 12/12/2018 02:09 PM   TRIG 120.0 12/12/2018 02:09 PM   HDL 31.50 (L) 12/12/2018 02:09 PM   CHOLHDL 3 12/12/2018 02:09 PM   LDLCALC 50 12/12/2018 02:09 PM    Wt Readings from Last 3 Encounters:  12/25/18 206 lb 2.1 oz (93.5 kg)  12/12/18 207 lb 2 oz (94 kg)  11/16/18 216 lb (98  kg)     Exam:    Vital Signs:  BP 129/74    Pulse 81    Gen: Chronically ill-appearing.  Supplemental oxygen in place.  No acute distress Neck: No JVD Neuro: Moves all four extremities freely.  Speech fluent Psych: Normal affect.     ASSESSMENT & PLAN:    # Chronic diastolic heart failure:  # Hypertensive heart disesase: # Hypotension:  Bradley [name] is euvolemic and doing well.  His breathing has nearly returned to baseline after his COPD exacerbation.  Continue amlodipine, carvedilol, losartan, and furosemide.  # Longstanding Persistent Atrial fibrillation:  Asymptomatic.  Continue carvedilol.  Bradley [name] is not on anticoagulation due to prior intracranial bleed.  He is tolerating aspirin.  COVID-19 Education: The signs and symptoms of COVID-19 were discussed with the patient and how to seek care for testing (follow up with PCP or arrange E-visit).  The importance of social distancing was discussed today.  Patient Risk:   After full review of this patients clinical status, I feel that they are at least moderate risk at this time.  Time:   Today, I have spent 26 minutes with the patient with telehealth technology discussing heart failure, COPD and hypertension.     Medication Adjustments/Labs and Tests Ordered: Current medicines are reviewed at length with the patient today.  Concerns regarding medicines are outlined above.   Tests Ordered: No orders of the defined types were placed in this encounter.  Medication Changes: No orders of the defined types were placed in this encounter.   Disposition: Follow up with Adelin Ventrella C. Duke Salvia, MD, [facility]  in 6 month(s)  Signed, [name], MD  01/03/2019 3:39 PM    [facility]

## 2019-01-03 NOTE — Patient Instructions (Signed)
Medication Instructions:  Your physician recommends that you continue on your current medications as directed. Please refer to the Current Medication list given to you today.  If you need a refill on your cardiac medications before your next appointment, please call your pharmacy.   Lab work: NONE  Testing/Procedures: NONE  Follow-Up: At [facility], you and your health needs are our priority.  As part of our continuing mission to provide you with exceptional heart care, we have created designated Provider Care Teams.  These Care Teams include your primary Cardiologist (physician) and Advanced Practice Providers (APPs -  Physician Assistants and Nurse Practitioners) who all work together to provide you with the care you need, when you need it. You will need a follow up appointment in 12 months.  Please call our office 2 months in advance to schedule this appointment.  You may see Tiffany Converse, MD or one of the following Advanced Practice Providers on your designated Care Team:   Luke Kilroy, PA-C Krista Kroeger, PA-C . Callie Goodrich, PA-C    

## 2019-01-03 NOTE — Progress Notes (Deleted)
Virtual Visit via Video Note    Evaluation Performed:  Follow-up visit  This visit type was conducted due to national recommendations for restrictions regarding the COVID-19 Pandemic (e.g. social distancing).  This format is felt to be most appropriate for this patient at this time.  All issues noted in this document were discussed and addressed.  No physical exam was performed (except for noted visual exam findings with Video Visits).  Please refer to the patient's chart (MyChart message for video visits and phone note for telephone visits) for the patient's consent to telehealth for [facility].  Date:  01/03/2019   ID:  Bradley Searles Sr., DOB May 22, 1941, MRN [id number]  Patient Location: *** [address] [place] [address]   Provider location:   ***  PCP:  [name], MD  Cardiologist:  [name], MD *** Electrophysiologist:  None   Chief Complaint:  ***  History of Present Illness:    Bradley Searles Sr. is a 78 y.o. male who presents via audio/video conferencing for a telehealth visit today.  ***     The patient {does/does not:200015} symptoms concerning for COVID-19 infection (fever, chills, cough, or new SHORTNESS OF BREATH).    Prior CV studies:   The following studies were reviewed today:  ***  Past Medical History:  Diagnosis Date  . Abnormal CT scan, chest 2012   CT chest, several lymphadenopathies. Sees pulmonary  . Anemia    intermittent  . Arthritis    "?back" (09/19/2018)  . Atrial fibrillation (HCC)    s/p AV node ablation & BiV PPM implantation 09/08/11 (op dictation pending)  . BPH (benign prostatic hyperplasia)    Saw Dr [name] 2004, normal renal u/s  . Bronchitis 08/26/2017  . CAD (coronary artery disease)   . CHF (congestive heart failure) (HCC)    Thought primarily to be non-systolic although EF down (EF 35-45% 12/2010, down to 35-40% 09/05/11), cath 2008 with no CAD, nuclear study 07/2011 showing Small area of  reversibility in the distal ant/lat wall the left ventricle suspicious for ischemia/septal wall HK but felt to be low risk  (per D/C Summary 07/2011)  . Chronic bronchitis (HCC)    "get it ~ q yr"  . Heart murmur   . HLD (hyperlipidemia)   . HTN (hypertension)   . 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 Insomnia   . Migraines    "very very rare"  . On home oxygen therapy    "2L; 24/7" (09/19/2018)  . Pacemaker   . Peripheral vascular disease (HCC)    ??  . Pleural effusion 2008   S/p decortication  . Pulmonary HTN (HCC)    per cath 2008  . Type II diabetes mellitus (HCC) 1999   Past Surgical History:  Procedure Laterality Date  . BI-VENTRICULAR PACEMAKER INSERTION Left 09/08/2011   Procedure: BI-VENTRICULAR PACEMAKER INSERTION (CRT-P);  Surgeon: Marinus Maw, MD;  Location: [facility] CATH LAB;  Service: Cardiovascular;  Laterality: Left;  . CARDIAC CATHETERIZATION     "couple times; never had balloon or stent" (09/19/2018)  . CATARACT EXTRACTION W/ INTRAOCULAR LENS  IMPLANT, BILATERAL Bilateral 08/2018  . COLONOSCOPY  03/10/11   normal  . INSERT / REPLACE / REMOVE PACEMAKER  09/08/11   pacemaker placement  . LUNG DECORTICATION    . PLEURAL SCARIFICATION    . pneumothorax with fibrothorax  ~ 2010  . TONSILLECTOMY     "as a kid"  No outpatient medications have been marked as taking for the 01/03/19 encounter (Telemedicine) with [name], MD.     Allergies:   Hydrocodone; Tramadol; Tadalafil; and Avelox [moxifloxacin hydrochloride]   Social History   Tobacco Use  . Smoking status: Former Smoker    Packs/day: 2.50    Years: 25.00    Pack years: 62.50    Types: Cigarettes, Cigars    Last attempt to quit: 10/11/1980    Years since quitting: 38.2  . Smokeless tobacco: Never Used  Substance Use Topics  . Alcohol use: Yes    Comment: 1 can of beer ever 4-5 months  . Drug use: No     Family Hx: The patient's family history includes  Breast cancer in his maternal aunt; Coronary artery disease in his father; Diabetes in his father; Drug abuse in his son; Polycythemia in his mother. There is no history of Prostate cancer or Colon cancer.  ROS:   Please see the history of present illness.    *** All other systems reviewed and are negative.   Labs/Other Tests and Data Reviewed:    Recent Labs: 06/07/2018: TSH 1.61 09/19/2018: ALT 15 12/21/2018: B Natriuretic Peptide 237.9 12/26/2018: BUN 21; Creatinine, Ser 0.78; Hemoglobin 13.2; Magnesium 2.1; Platelets 153; Potassium 4.5; Sodium 135   Recent Lipid Panel Lab Results  Component Value Date/Time   CHOL 105 12/12/2018 02:09 PM   TRIG 120.0 12/12/2018 02:09 PM   HDL 31.50 (L) 12/12/2018 02:09 PM   CHOLHDL 3 12/12/2018 02:09 PM   LDLCALC 50 12/12/2018 02:09 PM    Wt Readings from Last 3 Encounters:  12/25/18 206 lb 2.1 oz (93.5 kg)  12/12/18 207 lb 2 oz (94 kg)  11/16/18 216 lb (98 kg)     Exam:    Vital Signs:  There were no vitals taken for this visit.   Well nourished, well developed male in no*** acute distress. ***  ASSESSMENT & PLAN:    1.  ***  COVID-19 Education: The signs and symptoms of COVID-19 were discussed with the patient and how to seek care for testing (follow up with PCP or arrange E-visit).  ***The importance of social distancing was discussed today.  Patient Risk:   After full review of this patients clinical status, I feel that they are at least moderate risk at this time.  Time:   Today, I have spent *** minutes with the patient with telehealth technology discussing ***.     Medication Adjustments/Labs and Tests Ordered: Current medicines are reviewed at length with the patient today.  Concerns regarding medicines are outlined above.  Tests Ordered: No orders of the defined types were placed in this encounter.  Medication Changes: No orders of the defined types were placed in this encounter.   Disposition:  {follow up:15908}   Signed, Nicki Guadalajara, MD  01/03/2019 11:38 AM    [facility]

## 2019-01-03 NOTE — Telephone Encounter (Signed)
Called patient per CMA to inform him he needs to come in for labs only 01/04/19.

## 2019-01-04 ENCOUNTER — Other Ambulatory Visit: Payer: Self-pay

## 2019-01-04 ENCOUNTER — Other Ambulatory Visit (INDEPENDENT_AMBULATORY_CARE_PROVIDER_SITE_OTHER): Payer: Medicare Other

## 2019-01-04 DIAGNOSIS — J9621 Acute and chronic respiratory failure with hypoxia: Secondary | ICD-10-CM

## 2019-01-04 LAB — CBC WITH DIFFERENTIAL/PLATELET
Basophils Absolute: 0 10*3/uL (ref 0.0–0.1)
Basophils Relative: 0.3 % (ref 0.0–3.0)
Eosinophils Absolute: 0.1 10*3/uL (ref 0.0–0.7)
Eosinophils Relative: 0.7 % (ref 0.0–5.0)
HCT: 41.7 % (ref 39.0–52.0)
Hemoglobin: 14 g/dL (ref 13.0–17.0)
Lymphocytes Relative: 13.1 % (ref 12.0–46.0)
Lymphs Abs: 1.5 10*3/uL (ref 0.7–4.0)
MCHC: 33.5 g/dL (ref 30.0–36.0)
MCV: 91.6 fl (ref 78.0–100.0)
Monocytes Absolute: 0.8 10*3/uL (ref 0.1–1.0)
Monocytes Relative: 7.3 % (ref 3.0–12.0)
Neutro Abs: 8.9 10*3/uL — ABNORMAL HIGH (ref 1.4–7.7)
Neutrophils Relative %: 78.6 % — ABNORMAL HIGH (ref 43.0–77.0)
Platelets: 203 10*3/uL (ref 150.0–400.0)
RBC: 4.55 Mil/uL (ref 4.22–5.81)
RDW: 14 % (ref 11.5–15.5)
WBC: 11.3 10*3/uL — ABNORMAL HIGH (ref 4.0–10.5)

## 2019-01-04 LAB — BASIC METABOLIC PANEL
BUN: 8 mg/dL (ref 6–23)
CO2: 33 mEq/L — ABNORMAL HIGH (ref 19–32)
Calcium: 8.9 mg/dL (ref 8.4–10.5)
Chloride: 100 mEq/L (ref 96–112)
Creatinine, Ser: 0.75 mg/dL (ref 0.40–1.50)
GFR: 100.75 mL/min (ref >60.00)
Glucose, Bld: 168 mg/dL — ABNORMAL HIGH (ref 70–99)
Potassium: 5 mEq/L (ref 3.5–5.1)
Sodium: 139 mEq/L (ref 135–145)

## 2019-01-10 ENCOUNTER — Other Ambulatory Visit: Payer: Self-pay | Admitting: Internal Medicine

## 2019-01-10 MED ORDER — NITROGLYCERIN 0.4 MG SL SUBL: 0.4000 mg | SUBLINGUAL_TABLET | SUBLINGUAL | 3 refills | Status: DC | PRN

## 2019-01-10 NOTE — Telephone Encounter (Signed)
Rx sent 

## 2019-01-10 NOTE — Telephone Encounter (Signed)
Copied from CRM [id number]. Topic: Quick Communication - Rx Refill/Question >> Jan 10, 2019  2:19 PM Mickel Baas B, NT wrote: Medication: nitroGLYCERIN (NITROSTAT) 0.4 MG SL tablet  Has the patient contacted their pharmacy? yes (Agent: If no, request that the patient contact the pharmacy for the refill.) (Agent: If yes, when and what did the pharmacy advise?)  Preferred Pharmacy (with phone number or street name): [facility] - [place], [place] - [address]  Agent: Please be advised that RX refills may take up to 3 business days. We ask that you follow-up with your pharmacy.

## 2019-01-16 ENCOUNTER — Other Ambulatory Visit: Payer: Self-pay

## 2019-01-16 ENCOUNTER — Ambulatory Visit (INDEPENDENT_AMBULATORY_CARE_PROVIDER_SITE_OTHER): Payer: Medicare Other | Admitting: Internal Medicine

## 2019-01-16 DIAGNOSIS — I25810 Atherosclerosis of coronary artery bypass graft(s) without angina pectoris: Secondary | ICD-10-CM

## 2019-01-16 DIAGNOSIS — I5032 Chronic diastolic (congestive) heart failure: Secondary | ICD-10-CM

## 2019-01-16 DIAGNOSIS — I10 Essential (primary) hypertension: Secondary | ICD-10-CM

## 2019-01-16 DIAGNOSIS — J441 Chronic obstructive pulmonary disease with (acute) exacerbation: Secondary | ICD-10-CM | POA: Diagnosis not present

## 2019-01-16 NOTE — Progress Notes (Signed)
Subjective:    Patient ID: Bradley Sit., male    DOB: 12-19-1940, 78 y.o.   MRN: [id number]  DOS:  01/16/2019 Type of visit - description: Virtual Visit via Video Note  I connected with@ on 01/16/19 at  8:20 AM EDT by a video enabled telemedicine application and verified that I am speaking with the correct person using two identifiers.   THIS ENCOUNTER IS A VIRTUAL VISIT DUE TO COVID-19 - PATIENT WAS NOT SEEN IN THE OFFICE. PATIENT HAS CONSENTED TO VIRTUAL VISIT / TELEMEDICINE VISIT   Location of patient: home  Location of provider: office  I discussed the limitations of evaluation and management by telemedicine and the availability of in person appointments. The patient expressed understanding and agreed to proceed.  History of Present Illness: Follow-up hospital admission.  Was discharged from the hospital on 12/26/2018.  Was admitted with COPD exacerbation, possibly due to viral bronchitis.  Other problems were stable. After the hospital visit, he saw cardiology 01/03/2019, felt to be stable. Today he reports he continues to do well, no major concerns     Review of Systems Good compliance with oxygen most of the time.  Specifically he reports he use his oxygen at night, O2 sats in the high 90s.  W/O oxygen it drops to the 80s. Ambulatory BPs in the 120s/80s. Denies fever chills No chest pain No edema No nausea, vomiting, diarrhea  Past Medical History:  Diagnosis Date  . Abnormal CT scan, chest 2012   CT chest, several lymphadenopathies. Sees pulmonary  . Anemia    intermittent  . Arthritis    "?back" (09/19/2018)  . Atrial fibrillation (HCC)    s/p AV node ablation & BiV PPM implantation 09/08/11 (op dictation pending)  . BPH (benign prostatic hyperplasia)    Saw Dr [name] 2004, normal renal u/s  . Bronchitis 08/26/2017  . CAD (coronary artery disease)   . CHF (congestive heart failure) (HCC)    Thought primarily to be non-systolic although EF down (EF 06-23%  12/2010, down to 35-40% 09/05/11), cath 2008 with no CAD, nuclear study 07/2011 showing Small area of reversibility in the distal ant/lat wall the left ventricle suspicious for ischemia/septal wall HK but felt to be low risk  (per D/C Summary 07/2011)  . Chronic bronchitis (HCC)    "get it ~ q yr"  . Heart murmur   . HLD (hyperlipidemia)   . HTN (hypertension)   . 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 Insomnia   . Migraines    "very very rare"  . On home oxygen therapy    "2L; 24/7" (09/19/2018)  . Pacemaker   . Peripheral vascular disease (HCC)    ??  . Pleural effusion 2008   S/p decortication  . Pulmonary HTN (HCC)    per cath 2008  . Type II diabetes mellitus (HCC) 1999    Past Surgical History:  Procedure Laterality Date  . BI-VENTRICULAR PACEMAKER INSERTION Left 09/08/2011   Procedure: BI-VENTRICULAR PACEMAKER INSERTION (CRT-P);  Surgeon: Marinus Maw, MD;  Location: [facility] CATH LAB;  Service: Cardiovascular;  Laterality: Left;  . CARDIAC CATHETERIZATION     "couple times; never had balloon or stent" (09/19/2018)  . CATARACT EXTRACTION W/ INTRAOCULAR LENS  IMPLANT, BILATERAL Bilateral 08/2018  . COLONOSCOPY  03/10/11   normal  . INSERT / REPLACE / REMOVE PACEMAKER  09/08/11   pacemaker placement  . LUNG DECORTICATION    . PLEURAL SCARIFICATION    .  pneumothorax with fibrothorax  ~ 2010  . TONSILLECTOMY     "as a kid"     Social History   Socioeconomic History  . Marital status: Married    Spouse name: [name]  . Number of children: 1  . Years of education: Not on file  . Highest education level: Not on file  Occupational History  . Occupation: retired Publishing rights manager: RETIRED  Social Needs  . Financial resource strain: Not hard at all  . Food insecurity:    Worry: Never true    Inability: Never true  . Transportation needs:    Medical: No    Non-medical: No  Tobacco Use  . Smoking status: Former Smoker     Packs/day: 2.50    Years: 25.00    Pack years: 62.50    Types: Cigarettes, Cigars    Last attempt to quit: 10/11/1980    Years since quitting: 38.2  . Smokeless tobacco: Never Used  Substance and Sexual Activity  . Alcohol use: Yes    Comment: 1 can of beer ever 4-5 months  . Drug use: No  . Sexual activity: Not Currently  Lifestyle  . Physical activity:    Days per week: 6 days    Minutes per session: 20 min  . Stress: Not at all  Relationships  . Social connections:    Talks on phone: More than three times a week    Gets together: More than three times a week    Attends religious service: More than 4 times per year    Active member of club or organization: No    Attends meetings of clubs or organizations: Never    Relationship status: Married  . Intimate partner violence:    Fear of current or ex partner: Not on file    Emotionally abused: Not on file    Physically abused: Not on file    Forced sexual activity: Not on file  Other Topics Concern  . Not on file  Social History Narrative   Moved from [place] 2003.[place]   He lives w/ his wife, [name]   1 son, substance abuse, passed away 11-01-2016            Allergies as of 01/16/2019      Reactions   Hydrocodone Other (See Comments)   "given to him in the hospital; went thru withdrawals once home; dr said not to take it again" (09/27/2012)   Tramadol Other (See Comments)   "given to him in the hospital; went thru withdrawals once home; dr said not to take it again" (09/27/2012)   Tadalafil Other (See Comments)   headache, backache   Avelox [moxifloxacin Hydrochloride] Itching, Other (See Comments)   Headache      Medication List       Accurate as of January 16, 2019  8:20 AM. Always use your most recent med list.        amLODipine 10 MG tablet Commonly known as:  NORVASC Take 1 tablet (10 mg total) by mouth daily.   aspirin 81 MG tablet Take 81 mg by mouth every morning.   atorvastatin 20 MG tablet Commonly known  as:  LIPITOR Take 1 tablet (20 mg total) by mouth at bedtime.   budesonide 0.5 MG/2ML nebulizer solution Commonly known as:  PULMICORT Take 2 mLs (0.5 mg total) by nebulization 2 (two) times daily for 30 days.   calcium-vitamin D 500-200 MG-UNIT tablet Commonly known as:  OSCAL WITH  D Take 1 tablet by mouth every morning.   carvedilol 25 MG tablet Commonly known as:  COREG Take 1 tablet (25 mg total) by mouth 2 (two) times daily with a meal.   furosemide 40 MG tablet Commonly known as:  Lasix Take 1 tablet (40 mg total) by mouth daily.   ipratropium-albuterol 0.5-2.5 (3) MG/3ML Soln Commonly known as:  DUONEB Take 3 mLs by nebulization 2 (two) times daily as needed.   losartan 100 MG tablet Commonly known as:  COZAAR Take 0.5 tablets (50 mg total) by mouth daily.   multivitamins ther. w/minerals Tabs tablet Take 1 tablet by mouth daily.   nitroGLYCERIN 0.4 MG SL tablet Commonly known as:  NITROSTAT Place 1 tablet (0.4 mg total) under the tongue every 5 (five) minutes x 3 doses as needed for chest pain.   onetouch ultrasoft lancets Check blood sugars no more than twice daily   OneTouch Verio test strip Generic drug:  glucose blood CHECK BLOOD SUGAR NO MORE THAN TWICE A DAY   pantoprazole 40 MG tablet Commonly known as:  PROTONIX Take 1 tablet (40 mg total) by mouth every morning.   sitaGLIPtin 100 MG tablet Commonly known as:  Januvia Take 1 tablet (100 mg total) by mouth daily.   tamsulosin 0.4 MG Caps capsule Commonly known as:  FLOMAX Take 1 capsule (0.4 mg total) by mouth daily.   zolpidem 10 MG tablet Commonly known as:  AMBIEN Take 0.5 tablets (5 mg total) by mouth at bedtime as needed for sleep.           Objective:   Physical Exam There were no vitals taken for this visit. Sees a video conference, he is alert oriented x3, on no distress, speaking in complete sentences, has O2 on     Assessment    Assessment  DM HTN Hyperlipidemia  Anxiety- insomnia  BPH Cardiovascular:Dr Ladona Ridgel  --CAD -- CHF --Atrial fibrillation;  --Pacemaker --h/o IC bleeding: Not anticoagulated --Peripheral vascular disease? Pulmonary: Dr [name], last OV 10-18-2014, f/u prn --COPD, severe, chronic resp failure, night O2 prn, has portable O2  --Pleural effusion s/p decortication --Pulmonary hypertension  Stasis dermatitis   PLAN: Video visit  COPD with exacerbation: Status post recent admission, fully recovered, good compliance with oxygen, and nebulizers. DM: Continue Januvia. HTN: Seems well controlled, BMP after hospital admission was satisfactory.  Continue amlodipine, carvedilol, Lasix, losartan. 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 RTC 3 months, patient will call for a appointment.    I discussed the assessment and treatment plan with the patient. The patient was provided an opportunity to ask questions and all were answered. The patient agreed with the plan and demonstrated an understanding of the instructions.   The patient was advised to call back or seek an in-person evaluation if the symptoms worsen or if the condition fails to improve as anticipated.

## 2019-01-17 NOTE — Assessment & Plan Note (Signed)
Video visit  COPD with exacerbation: Status post recent admission, fully recovered, good compliance with oxygen, and nebulizers. DM: Continue Januvia. HTN: Seems well controlled, BMP after hospital admission was satisfactory.  Continue amlodipine, carvedilol, Lasix, losartan. 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 RTC 3 months, patient will call for a appointment.

## 2019-01-19 ENCOUNTER — Other Ambulatory Visit: Payer: Self-pay | Admitting: Internal Medicine

## 2019-02-18 ENCOUNTER — Other Ambulatory Visit: Payer: Self-pay | Admitting: Internal Medicine

## 2019-02-26 ENCOUNTER — Ambulatory Visit (INDEPENDENT_AMBULATORY_CARE_PROVIDER_SITE_OTHER): Payer: Medicare Other | Admitting: *Deleted

## 2019-02-26 ENCOUNTER — Other Ambulatory Visit: Payer: Self-pay

## 2019-02-26 DIAGNOSIS — I495 Sick sinus syndrome: Secondary | ICD-10-CM

## 2019-02-27 LAB — CUP PACEART REMOTE DEVICE CHECK
Battery Remaining Longevity: 38 mo
Battery Voltage: 2.95 V
Brady Statistic AP VP Percent: 0 %
Brady Statistic AP VS Percent: 0 %
Brady Statistic AS VP Percent: 99.12 %
Brady Statistic AS VS Percent: 0.88 %
Brady Statistic RA Percent Paced: 0 %
Brady Statistic RV Percent Paced: 99.6 %
Date Time Interrogation Session: 20200518183200
Implantable Lead Implant Date: 20121128
Implantable Lead Implant Date: 20121128
Implantable Lead Location: 753858
Implantable Lead Location: 753860
Implantable Lead Model: 4194
Implantable Lead Model: 5076
Implantable Pulse Generator Implant Date: 20121128
Lead Channel Impedance Value: 361 Ohm
Lead Channel Impedance Value: 4047 Ohm
Lead Channel Impedance Value: 4047 Ohm
Lead Channel Impedance Value: 418 Ohm
Lead Channel Impedance Value: 456 Ohm
Lead Channel Impedance Value: 532 Ohm
Lead Channel Impedance Value: 570 Ohm
Lead Channel Impedance Value: 589 Ohm
Lead Channel Impedance Value: 589 Ohm
Lead Channel Pacing Threshold Amplitude: 0.75 V
Lead Channel Pacing Threshold Amplitude: 1.25 V
Lead Channel Pacing Threshold Pulse Width: 0.4 ms
Lead Channel Pacing Threshold Pulse Width: 0.4 ms
Lead Channel Sensing Intrinsic Amplitude: 5 mV
Lead Channel Sensing Intrinsic Amplitude: 5 mV
Lead Channel Setting Pacing Amplitude: 2 V
Lead Channel Setting Pacing Amplitude: 2.25 V
Lead Channel Setting Pacing Pulse Width: 0.4 ms
Lead Channel Setting Pacing Pulse Width: 0.4 ms
Lead Channel Setting Sensing Sensitivity: 4 mV

## 2019-03-06 ENCOUNTER — Encounter: Payer: Self-pay | Admitting: Cardiology

## 2019-03-06 NOTE — Progress Notes (Signed)
Remote pacemaker transmission.   

## 2019-03-09 ENCOUNTER — Telehealth: Payer: Self-pay | Admitting: Internal Medicine

## 2019-03-09 MED ORDER — ZOLPIDEM TARTRATE 10 MG PO TABS: 5.0000 mg | ORAL_TABLET | Freq: Every evening | ORAL | 0 refills | Status: DC | PRN

## 2019-03-09 NOTE — Telephone Encounter (Signed)
90day supply sent.

## 2019-03-09 NOTE — Telephone Encounter (Signed)
Ambien refill.   Last OV: 01/16/2019 Last Fill: 09/25/2018 #20 and 0RF UDS: Ambien

## 2019-03-09 NOTE — Telephone Encounter (Signed)
Copied from CRM [id number]. Topic: Quick Communication - Rx Refill/Question >> Mar 09, 2019 10:58 AM Reggie Pile, NT wrote: Medication:  zolpidem (AMBIEN) 10 MG tablet  Has the patient contacted their pharmacy? Yes stated they need to MD to send it in.   Preferred Pharmacy (with phone number or street name):  [facility] - [place], [place] - [address] [phone] (Phone) [phone] (Fax)  Agent: Please be advised that RX refills may take up to 3 business days. We ask that you follow-up with your pharmacy.

## 2019-04-14 ENCOUNTER — Other Ambulatory Visit: Payer: Self-pay | Admitting: Internal Medicine

## 2019-04-18 ENCOUNTER — Ambulatory Visit (INDEPENDENT_AMBULATORY_CARE_PROVIDER_SITE_OTHER): Payer: Medicare Other | Admitting: Internal Medicine

## 2019-04-18 ENCOUNTER — Other Ambulatory Visit: Payer: Self-pay

## 2019-04-18 ENCOUNTER — Encounter: Payer: Self-pay | Admitting: Internal Medicine

## 2019-04-18 VITALS — BP 129/77 | HR 87 | Temp 97.9°F | Resp 20 | Ht 69.0 in | Wt 216.5 lb

## 2019-04-18 DIAGNOSIS — E785 Hyperlipidemia, unspecified: Secondary | ICD-10-CM

## 2019-04-18 DIAGNOSIS — I25810 Atherosclerosis of coronary artery bypass graft(s) without angina pectoris: Secondary | ICD-10-CM | POA: Diagnosis not present

## 2019-04-18 DIAGNOSIS — E118 Type 2 diabetes mellitus with unspecified complications: Secondary | ICD-10-CM | POA: Diagnosis not present

## 2019-04-18 DIAGNOSIS — I10 Essential (primary) hypertension: Secondary | ICD-10-CM | POA: Diagnosis not present

## 2019-04-18 DIAGNOSIS — R1013 Epigastric pain: Secondary | ICD-10-CM | POA: Diagnosis not present

## 2019-04-18 DIAGNOSIS — R1084 Generalized abdominal pain: Secondary | ICD-10-CM

## 2019-04-18 DIAGNOSIS — F419 Anxiety disorder, unspecified: Secondary | ICD-10-CM

## 2019-04-18 LAB — COMPREHENSIVE METABOLIC PANEL
ALT: 11 U/L (ref 0–53)
AST: 14 U/L (ref 0–37)
Albumin: 4 g/dL (ref 3.5–5.2)
Alkaline Phosphatase: 88 U/L (ref 39–117)
BUN: 9 mg/dL (ref 6–23)
CO2: 34 mEq/L — ABNORMAL HIGH (ref 19–32)
Calcium: 8.9 mg/dL (ref 8.4–10.5)
Chloride: 100 mEq/L (ref 96–112)
Creatinine, Ser: 0.81 mg/dL (ref 0.40–1.50)
GFR: 92.12 mL/min (ref >60.00)
Glucose, Bld: 128 mg/dL — ABNORMAL HIGH (ref 70–99)
Potassium: 5.1 mEq/L (ref 3.5–5.1)
Sodium: 140 mEq/L (ref 135–145)
Total Bilirubin: 0.9 mg/dL (ref 0.2–1.2)
Total Protein: 7 g/dL (ref 6.0–8.3)

## 2019-04-18 LAB — CBC WITH DIFFERENTIAL/PLATELET
Basophils Absolute: 0 10*3/uL (ref 0.0–0.1)
Basophils Relative: 0.5 % (ref 0.0–3.0)
Eosinophils Absolute: 0.1 10*3/uL (ref 0.0–0.7)
Eosinophils Relative: 1.4 % (ref 0.0–5.0)
HCT: 41.6 % (ref 39.0–52.0)
Hemoglobin: 13.7 g/dL (ref 13.0–17.0)
Lymphocytes Relative: 21.4 % (ref 12.0–46.0)
Lymphs Abs: 1.5 10*3/uL (ref 0.7–4.0)
MCHC: 33 g/dL (ref 30.0–36.0)
MCV: 92.2 fl (ref 78.0–100.0)
Monocytes Absolute: 0.6 10*3/uL (ref 0.1–1.0)
Monocytes Relative: 9.1 % (ref 3.0–12.0)
Neutro Abs: 4.6 10*3/uL (ref 1.4–7.7)
Neutrophils Relative %: 67.6 % (ref 43.0–77.0)
Platelets: 208 10*3/uL (ref 150.0–400.0)
RBC: 4.51 Mil/uL (ref 4.22–5.81)
RDW: 14 % (ref 11.5–15.5)
WBC: 6.9 10*3/uL (ref 4.0–10.5)

## 2019-04-18 NOTE — Progress Notes (Addendum)
Subjective:    Patient ID: Bradley Sit., male    DOB: 1941-05-31, 78 y.o.   MRN: [id number]  DOS:  04/18/2019 Type of visit - description: Routine visit He has several concerns For the last 4 weeks, he wakes up in the middle of the night and can go back to sleep easily. Admits to some stress, denies any physical problems such as pain or orthopnea. Takes Ambien at night.  Also, 2 to 3 months history of abdominal discomfort described as a feeling of bloated at the mid abdomen typically postprandial, last few hours  Also, reported exertional discomfort at the upper chest, he did not like to call it "chest pain", just " discomfort".  This is going on for 2 years and is not getting worse.    Review of Systems Denies fever chills or weight loss No nausea, vomiting, diarrhea.  Has normal bowel movements daily.  On PPIs, has occasional heartburn.  Past Medical History:  Diagnosis Date  . Abnormal CT scan, chest 2012   CT chest, several lymphadenopathies. Sees pulmonary  . Anemia    intermittent  . Arthritis    "?back" (09/19/2018)  . Atrial fibrillation (HCC)    s/p AV node ablation & BiV PPM implantation 09/08/11 (op dictation pending)  . BPH (benign prostatic hyperplasia)    Saw Dr [name] 2004, normal renal u/s  . Bronchitis 08/26/2017  . CAD (coronary artery disease)   . CHF (congestive heart failure) (HCC)    Thought primarily to be non-systolic although EF down (EF 57-90% 12/2010, down to 35-40% 09/05/11), cath 2008 with no CAD, nuclear study 07/2011 showing Small area of reversibility in the distal ant/lat wall the left ventricle suspicious for ischemia/septal wall HK but felt to be low risk  (per D/C Summary 07/2011)  . Chronic bronchitis (HCC)    "get it ~ q yr"  . Heart murmur   . HLD (hyperlipidemia)   . HTN (hypertension)   . 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 Insomnia   . Migraines    "very very rare"  . On home oxygen therapy     "2L; 24/7" (09/19/2018)  . Pacemaker   . Peripheral vascular disease (HCC)    ??  . Pleural effusion 2008   S/p decortication  . Pulmonary HTN (HCC)    per cath 2008  . Type II diabetes mellitus (HCC) 1999    Past Surgical History:  Procedure Laterality Date  . BI-VENTRICULAR PACEMAKER INSERTION Left 09/08/2011   Procedure: BI-VENTRICULAR PACEMAKER INSERTION (CRT-P);  Surgeon: Marinus Maw, MD;  Location: [facility] CATH LAB;  Service: Cardiovascular;  Laterality: Left;  . CARDIAC CATHETERIZATION     "couple times; never had balloon or stent" (09/19/2018)  . CATARACT EXTRACTION W/ INTRAOCULAR LENS  IMPLANT, BILATERAL Bilateral 08/2018  . COLONOSCOPY  03/10/11   normal  . INSERT / REPLACE / REMOVE PACEMAKER  09/08/11   pacemaker placement  . LUNG DECORTICATION    . PLEURAL SCARIFICATION    . pneumothorax with fibrothorax  ~ 2010  . TONSILLECTOMY     "as a kid"     Social History   Socioeconomic History  . Marital status: Married    Spouse name: [name]  . Number of children: 1  . Years of education: Not on file  . Highest education level: Not on file  Occupational History  . Occupation: retired Publishing rights manager: RETIRED  Social Needs  .  Financial resource strain: Not hard at all  . Food insecurity    Worry: Never true    Inability: Never true  . Transportation needs    Medical: No    Non-medical: No  Tobacco Use  . Smoking status: Former Smoker    Packs/day: 2.50    Years: 25.00    Pack years: 62.50    Types: Cigarettes, Cigars    Quit date: 10/11/1980    Years since quitting: 38.5  . Smokeless tobacco: Never Used  Substance and Sexual Activity  . Alcohol use: Yes    Comment: 1 can of beer ever 4-5 months  . Drug use: No  . Sexual activity: Not Currently  Lifestyle  . Physical activity    Days per week: 6 days    Minutes per session: 20 min  . Stress: Not at all  Relationships  . Social connections    Talks on phone: More than three times  a week    Gets together: More than three times a week    Attends religious service: More than 4 times per year    Active member of club or organization: No    Attends meetings of clubs or organizations: Never    Relationship status: Married  . Intimate partner violence    Fear of current or ex partner: Not on file    Emotionally abused: Not on file    Physically abused: Not on file    Forced sexual activity: Not on file  Other Topics Concern  . Not on file  Social History Narrative   Moved from [place] 2003.[place].   He lives w/ his wife, [name]   1 son, substance abuse, passed away 09/2016            Allergies as of 04/18/2019      Reactions   Hydrocodone Other (See Comments)   "given to him in the hospital; went thru withdrawals once home; dr said not to take it again" (09/27/2012)   Tramadol Other (See Comments)   "given to him in the hospital; went thru withdrawals once home; dr said not to take it again" (09/27/2012)   Tadalafil Other (See Comments)   headache, backache   Avelox [moxifloxacin Hydrochloride] Itching, Other (See Comments)   Headache      Medication List       Accurate as of April 18, 2019 11:59 PM. If you have any questions, ask your nurse or doctor.        amLODipine 10 MG tablet Commonly known as: NORVASC Take 1 tablet (10 mg total) by mouth daily.   aspirin 81 MG tablet Take 81 mg by mouth every morning.   atorvastatin 20 MG tablet Commonly known as: LIPITOR Take 1 tablet (20 mg total) by mouth at bedtime.   budesonide 0.5 MG/2ML nebulizer solution Commonly known as: PULMICORT Take 2 mLs (0.5 mg total) by nebulization 2 (two) times daily for 30 days.   calcium-vitamin D 500-200 MG-UNIT tablet Commonly known as: OSCAL WITH D Take 1 tablet by mouth every morning.   carvedilol 25 MG tablet Commonly known as: COREG Take 1 tablet (25 mg total) by mouth 2 (two) times daily with a meal.   furosemide 40 MG tablet Commonly known as: LASIX Take 1  tablet (40 mg total) by mouth daily.   ipratropium-albuterol 0.5-2.5 (3) MG/3ML Soln Commonly known as: DUONEB Take 3 mLs by nebulization 2 (two) times daily as needed.   losartan 100 MG tablet Commonly known as:  COZAAR Take 0.5 tablets (50 mg total) by mouth at bedtime.   multivitamins ther. w/minerals Tabs tablet Take 1 tablet by mouth daily.   nitroGLYCERIN 0.4 MG SL tablet Commonly known as: NITROSTAT Place 1 tablet (0.4 mg total) under the tongue every 5 (five) minutes x 3 doses as needed for chest pain.   onetouch ultrasoft lancets Check blood sugars no more than twice daily   OneTouch Verio test strip Generic drug: glucose blood USE TO CHECK BLOOD SUGAR NO MORE THAN TWICE A DAY   pantoprazole 40 MG tablet Commonly known as: PROTONIX Take 1 tablet (40 mg total) by mouth every morning.   sitaGLIPtin 100 MG tablet Commonly known as: Januvia Take 1 tablet (100 mg total) by mouth daily. What changed: when to take this   tamsulosin 0.4 MG Caps capsule Commonly known as: FLOMAX Take 1 capsule (0.4 mg total) by mouth daily. What changed: when to take this   zolpidem 10 MG tablet Commonly known as: AMBIEN Take 0.5 tablets (5 mg total) by mouth at bedtime as needed for sleep.           Objective:   Physical Exam BP 129/77 (BP Location: Right Arm, Patient Position: Sitting, Cuff Size: Small)   Pulse 87   Temp 97.9 F (36.6 C) (Oral)   Resp 20   Ht 5\' 9"  (1.753 m)   Wt 216 lb 8 oz (98.2 kg)   SpO2 90%   BMI 31.97 kg/m  General:   Well developed, NAD, BMI noted.  HEENT:  Normocephalic . Face symmetric, atraumatic Lungs:  Decreased breath sounds Normal respiratory effort, no intercostal retractions, no accessory muscle use. Heart: RRR,  no murmur.  no pretibial edema bilaterally  Abdomen:  Not distended, soft, non-tender. No rebound or rigidity.   Skin: Not pale. Not jaundice Neurologic:  alert & oriented X3.  Speech normal, gait appropriate for age  and unassisted Psych--  Cognition and judgment appear intact.  Cooperative with normal attention span and concentration.  Behavior appropriate. No anxious or depressed appearing.     Assessment     Assessment  DM HTN Hyperlipidemia GERD Anxiety- insomnia  BPH Cardiovascular:Dr Lovena Le  --  Cath x 2 [id number]: no significant CAD -- CHF, non-ischemic  --Atrial fibrillation; h/o AV node ablation, has a Pacemaker --h/o IC bleeding: Not anticoagulated --Peripheral vascular disease? Pulmonary: Dr Melvyn Novas, last OV 10-18-2014, f/u prn --COPD, severe, chronic resp failure, night O2 prn, has portable O2  --Pleural effusion s/p decortication --Pulmonary hypertension  Stasis dermatitis   PLAN: HTN: Seems well controlled, continue with Amlodipine, carvedilol, Lasix, losartan.  Check a CMP GERD:  controlled with only occasional breakthrough heartburn.  On Protonix qd.  Increase to twice daily for 2 weeks and see if that impacts dyspepsia.  See next. Dyspepsia: Described as feeling bloated postprandially, other no other GI symptoms.  H/o DM: gastroparesis?  Gallbladder?  Other?.  We will get a CBC and a  abdominal ultrasound.  Reassess in 2 months Chest pain: Stable for 2 years, had 2- cardiac cath before.  Recommend observation. Insomnia: See HPI, rec to continue with Ambien and add melatonin OTC.  Consider other medications if not better. RTC 2 months

## 2019-04-18 NOTE — Progress Notes (Signed)
Pre visit review using our clinic review tool, if applicable. No additional management support is needed unless otherwise documented below in the visit note. 

## 2019-04-18 NOTE — Patient Instructions (Signed)
GO TO THE LAB : Get the blood work     GO TO THE FRONT DESK Schedule your next appointment for a follow-up in 2 months  For the next 2 weeks, take Protonix twice a day: Before breakfast and before dinner, then go back to only once daily.  See if that helps with your stomach discomfort.  Go to the first floor and ultrasound for your gallbladder  Get a flu shot early this season   For sleep: Go to bed at the same time every night Continue Ambien OTC melatonin 4 to 8 mg at night.  Take it every night. Do not eat for 2 3 hours before bedtime Call if not better

## 2019-04-19 NOTE — Assessment & Plan Note (Signed)
HTN: Seems well controlled, continue with Amlodipine, carvedilol, Lasix, losartan.  Check a CMP GERD:  controlled with only occasional breakthrough heartburn.  On Protonix qd.  Increase to twice daily for 2 weeks and see if that impacts dyspepsia.  See next. Dyspepsia: Described as feeling bloated postprandially, other no other GI symptoms.  H/o DM: gastroparesis?  Gallbladder?  Other?.  We will get a CBC and a  abdominal ultrasound.  Reassess in 2 months Chest pain: Stable for 2 years, had 2- cardiac cath before.  Recommend observation. Insomnia: See HPI, rec to continue with Ambien and add melatonin OTC.  Consider other medications if not better. RTC 2 months

## 2019-04-20 ENCOUNTER — Other Ambulatory Visit: Payer: Self-pay

## 2019-04-20 ENCOUNTER — Ambulatory Visit (HOSPITAL_BASED_OUTPATIENT_CLINIC_OR_DEPARTMENT_OTHER)
Admission: RE | Admit: 2019-04-20 | Discharge: 2019-04-20 | Disposition: A | Discharge status: Home / Self Care | Payer: Medicare Other | Source: Ambulatory Visit | Attending: Internal Medicine | Admitting: Internal Medicine

## 2019-04-20 DIAGNOSIS — R1013 Epigastric pain: Secondary | ICD-10-CM | POA: Insufficient documentation

## 2019-04-20 DIAGNOSIS — R1084 Generalized abdominal pain: Secondary | ICD-10-CM | POA: Diagnosis not present

## 2019-05-07 ENCOUNTER — Ambulatory Visit: Payer: Self-pay | Admitting: Internal Medicine

## 2019-05-07 ENCOUNTER — Encounter (HOSPITAL_COMMUNITY): Payer: Self-pay | Admitting: *Deleted

## 2019-05-07 ENCOUNTER — Emergency Department (HOSPITAL_COMMUNITY): Payer: Medicare Other

## 2019-05-07 ENCOUNTER — Other Ambulatory Visit: Payer: Self-pay

## 2019-05-07 ENCOUNTER — Inpatient Hospital Stay (HOSPITAL_COMMUNITY)
Admission: EM | Admit: 2019-05-07 | Discharge: 2019-05-11 | DRG: 291 | Disposition: A | Discharge status: Home Health | Payer: Medicare Other | Attending: Family Medicine | Admitting: Family Medicine

## 2019-05-07 DIAGNOSIS — I5033 Acute on chronic diastolic (congestive) heart failure: Secondary | ICD-10-CM

## 2019-05-07 DIAGNOSIS — I480 Paroxysmal atrial fibrillation: Secondary | ICD-10-CM | POA: Diagnosis not present

## 2019-05-07 DIAGNOSIS — Z6832 Body mass index (BMI) 32.0-32.9, adult: Secondary | ICD-10-CM

## 2019-05-07 DIAGNOSIS — R0602 Shortness of breath: Secondary | ICD-10-CM | POA: Diagnosis not present

## 2019-05-07 DIAGNOSIS — Z7951 Long term (current) use of inhaled steroids: Secondary | ICD-10-CM

## 2019-05-07 DIAGNOSIS — J9621 Acute and chronic respiratory failure with hypoxia: Secondary | ICD-10-CM | POA: Diagnosis not present

## 2019-05-07 DIAGNOSIS — J441 Chronic obstructive pulmonary disease with (acute) exacerbation: Secondary | ICD-10-CM | POA: Diagnosis present

## 2019-05-07 DIAGNOSIS — Z79899 Other long term (current) drug therapy: Secondary | ICD-10-CM

## 2019-05-07 DIAGNOSIS — N400 Enlarged prostate without lower urinary tract symptoms: Secondary | ICD-10-CM | POA: Diagnosis present

## 2019-05-07 DIAGNOSIS — I10 Essential (primary) hypertension: Secondary | ICD-10-CM | POA: Diagnosis present

## 2019-05-07 DIAGNOSIS — Z7984 Long term (current) use of oral hypoglycemic drugs: Secondary | ICD-10-CM

## 2019-05-07 DIAGNOSIS — Z20828 Contact with and (suspected) exposure to other viral communicable diseases: Secondary | ICD-10-CM | POA: Diagnosis present

## 2019-05-07 DIAGNOSIS — Z23 Encounter for immunization: Secondary | ICD-10-CM

## 2019-05-07 DIAGNOSIS — Z833 Family history of diabetes mellitus: Secondary | ICD-10-CM

## 2019-05-07 DIAGNOSIS — I2510 Atherosclerotic heart disease of native coronary artery without angina pectoris: Secondary | ICD-10-CM | POA: Diagnosis present

## 2019-05-07 DIAGNOSIS — I5032 Chronic diastolic (congestive) heart failure: Secondary | ICD-10-CM | POA: Diagnosis not present

## 2019-05-07 DIAGNOSIS — I4891 Unspecified atrial fibrillation: Secondary | ICD-10-CM | POA: Diagnosis present

## 2019-05-07 DIAGNOSIS — Z87891 Personal history of nicotine dependence: Secondary | ICD-10-CM

## 2019-05-07 DIAGNOSIS — I110 Hypertensive heart disease with heart failure: Secondary | ICD-10-CM | POA: Diagnosis not present

## 2019-05-07 DIAGNOSIS — Z7982 Long term (current) use of aspirin: Secondary | ICD-10-CM

## 2019-05-07 DIAGNOSIS — E119 Type 2 diabetes mellitus without complications: Secondary | ICD-10-CM

## 2019-05-07 DIAGNOSIS — I482 Chronic atrial fibrillation, unspecified: Secondary | ICD-10-CM | POA: Diagnosis present

## 2019-05-07 DIAGNOSIS — E785 Hyperlipidemia, unspecified: Secondary | ICD-10-CM | POA: Diagnosis present

## 2019-05-07 DIAGNOSIS — Z950 Presence of cardiac pacemaker: Secondary | ICD-10-CM

## 2019-05-07 DIAGNOSIS — J440 Chronic obstructive pulmonary disease with acute lower respiratory infection: Secondary | ICD-10-CM | POA: Diagnosis not present

## 2019-05-07 DIAGNOSIS — E1151 Type 2 diabetes mellitus with diabetic peripheral angiopathy without gangrene: Secondary | ICD-10-CM | POA: Diagnosis present

## 2019-05-07 DIAGNOSIS — I4821 Permanent atrial fibrillation: Secondary | ICD-10-CM | POA: Diagnosis present

## 2019-05-07 DIAGNOSIS — I272 Pulmonary hypertension, unspecified: Secondary | ICD-10-CM | POA: Diagnosis present

## 2019-05-07 DIAGNOSIS — Z9981 Dependence on supplemental oxygen: Secondary | ICD-10-CM

## 2019-05-07 DIAGNOSIS — J449 Chronic obstructive pulmonary disease, unspecified: Secondary | ICD-10-CM | POA: Diagnosis present

## 2019-05-07 LAB — BASIC METABOLIC PANEL
Anion gap: 9 (ref 5–15)
BUN: 8 mg/dL (ref 8–23)
CO2: 29 mmol/L (ref 22–32)
Calcium: 8.4 mg/dL — ABNORMAL LOW (ref 8.9–10.3)
Chloride: 102 mmol/L (ref 98–111)
Creatinine, Ser: 0.83 mg/dL (ref 0.61–1.24)
GFR calc Af Amer: >60 mL/min (ref >60)
GFR calc non Af Amer: >60 mL/min (ref >60)
Glucose, Bld: 130 mg/dL — ABNORMAL HIGH (ref 70–99)
Potassium: 4.2 mmol/L (ref 3.5–5.1)
Sodium: 140 mmol/L (ref 135–145)

## 2019-05-07 LAB — CBC
HCT: 37.7 % — ABNORMAL LOW (ref 39.0–52.0)
Hemoglobin: 12 g/dL — ABNORMAL LOW (ref 13.0–17.0)
MCH: 30.1 pg (ref 26.0–34.0)
MCHC: 31.8 g/dL (ref 30.0–36.0)
MCV: 94.5 fL (ref 80.0–100.0)
Platelets: 218 10*3/uL (ref 150–400)
RBC: 3.99 MIL/uL — ABNORMAL LOW (ref 4.22–5.81)
RDW: 13.1 % (ref 11.5–15.5)
WBC: 6.7 10*3/uL (ref 4.0–10.5)
nRBC: 0 % (ref 0.0–0.2)

## 2019-05-07 LAB — GLUCOSE, CAPILLARY: Glucose-Capillary: 191 mg/dL — ABNORMAL HIGH (ref 70–99)

## 2019-05-07 LAB — LACTIC ACID, PLASMA: Lactic Acid, Venous: 0.8 mmol/L (ref 0.5–1.9)

## 2019-05-07 LAB — TROPONIN I (HIGH SENSITIVITY)
Troponin I (High Sensitivity): 5 ng/L (ref <18)
Troponin I (High Sensitivity): 6 ng/L (ref <18)

## 2019-05-07 LAB — SARS CORONAVIRUS 2 BY RT PCR (HOSPITAL ORDER, PERFORMED IN ~~LOC~~ HOSPITAL LAB): SARS Coronavirus 2: NEGATIVE

## 2019-05-07 LAB — CBG MONITORING, ED: Glucose-Capillary: 132 mg/dL — ABNORMAL HIGH (ref 70–99)

## 2019-05-07 MED ORDER — SODIUM CHLORIDE 0.9 % IV SOLN: 2.0000 g | Freq: Once | INTRAVENOUS | Status: DC

## 2019-05-07 MED ORDER — ENOXAPARIN SODIUM 40 MG/0.4ML ~~LOC~~ SOLN: 40.0000 mg | SUBCUTANEOUS | Status: DC

## 2019-05-07 MED ORDER — PNEUMOCOCCAL VAC POLYVALENT 25 MCG/0.5ML IJ INJ
0.5000 mL | INJECTION | INTRAMUSCULAR | Status: AC | PRN
  Administered 2019-05-11: 0.5 mL via INTRAMUSCULAR
  Filled 2019-05-07: qty 0.5

## 2019-05-07 MED ORDER — ATORVASTATIN CALCIUM 10 MG PO TABS
20.0000 mg | ORAL_TABLET | Freq: Every day | ORAL | Status: DC
  Administered 2019-05-07 – 2019-05-10 (×4): 20 mg via ORAL
  Filled 2019-05-07 (×4): qty 2

## 2019-05-07 MED ORDER — SODIUM CHLORIDE 0.9% FLUSH: 3.0000 mL | Freq: Once | INTRAVENOUS | Status: DC

## 2019-05-07 MED ORDER — DOXYCYCLINE HYCLATE 100 MG PO TABS
100.0000 mg | ORAL_TABLET | Freq: Two times a day (BID) | ORAL | Status: DC
  Administered 2019-05-07 – 2019-05-11 (×8): 100 mg via ORAL
  Filled 2019-05-07 (×8): qty 1

## 2019-05-07 MED ORDER — METHYLPREDNISOLONE SODIUM SUCC 125 MG IJ SOLR
60.0000 mg | Freq: Two times a day (BID) | INTRAMUSCULAR | Status: AC
  Administered 2019-05-07 – 2019-05-08 (×2): 60 mg via INTRAVENOUS
  Filled 2019-05-07 (×2): qty 2

## 2019-05-07 MED ORDER — AMLODIPINE BESYLATE 10 MG PO TABS
10.0000 mg | ORAL_TABLET | Freq: Every day | ORAL | Status: DC
  Administered 2019-05-08 – 2019-05-11 (×4): 10 mg via ORAL
  Filled 2019-05-07 (×4): qty 1

## 2019-05-07 MED ORDER — IPRATROPIUM-ALBUTEROL 0.5-2.5 (3) MG/3ML IN SOLN
3.0000 mL | Freq: Four times a day (QID) | RESPIRATORY_TRACT | Status: DC
  Administered 2019-05-07 – 2019-05-08 (×4): 3 mL via RESPIRATORY_TRACT
  Filled 2019-05-07 (×4): qty 3

## 2019-05-07 MED ORDER — ALBUTEROL SULFATE HFA 108 (90 BASE) MCG/ACT IN AERS
6.0000 | INHALATION_SPRAY | Freq: Once | RESPIRATORY_TRACT | Status: AC
  Administered 2019-05-07: 6 via RESPIRATORY_TRACT
  Filled 2019-05-07: qty 6.7

## 2019-05-07 MED ORDER — PREDNISONE 20 MG PO TABS
40.0000 mg | ORAL_TABLET | Freq: Every day | ORAL | Status: DC
  Filled 2019-05-07: qty 2

## 2019-05-07 MED ORDER — ASPIRIN EC 81 MG PO TBEC
81.0000 mg | DELAYED_RELEASE_TABLET | Freq: Every morning | ORAL | Status: DC
  Administered 2019-05-08 – 2019-05-11 (×4): 81 mg via ORAL
  Filled 2019-05-07 (×4): qty 1

## 2019-05-07 MED ORDER — SODIUM CHLORIDE 0.9 % IV SOLN
2.0000 g | Freq: Three times a day (TID) | INTRAVENOUS | Status: DC
  Administered 2019-05-07: 2 g via INTRAVENOUS
  Filled 2019-05-07: qty 2

## 2019-05-07 MED ORDER — TAMSULOSIN HCL 0.4 MG PO CAPS
0.4000 mg | ORAL_CAPSULE | Freq: Every morning | ORAL | Status: DC
  Administered 2019-05-08 – 2019-05-11 (×4): 0.4 mg via ORAL
  Filled 2019-05-07 (×4): qty 1

## 2019-05-07 MED ORDER — BUDESONIDE 0.5 MG/2ML IN SUSP
0.5000 mg | Freq: Two times a day (BID) | RESPIRATORY_TRACT | Status: DC
  Administered 2019-05-08 – 2019-05-11 (×7): 0.5 mg via RESPIRATORY_TRACT
  Filled 2019-05-07 (×7): qty 2

## 2019-05-07 MED ORDER — ALBUTEROL (5 MG/ML) CONTINUOUS INHALATION SOLN
10.0000 mg/h | INHALATION_SOLUTION | Freq: Once | RESPIRATORY_TRACT | Status: AC
  Administered 2019-05-07: 10 mg/h via RESPIRATORY_TRACT
  Filled 2019-05-07: qty 20

## 2019-05-07 MED ORDER — ZOLPIDEM TARTRATE 5 MG PO TABS
5.0000 mg | ORAL_TABLET | Freq: Every evening | ORAL | Status: DC | PRN
  Administered 2019-05-07: 5 mg via ORAL
  Filled 2019-05-07: qty 1

## 2019-05-07 MED ORDER — ENOXAPARIN SODIUM 40 MG/0.4ML ~~LOC~~ SOLN
40.0000 mg | SUBCUTANEOUS | Status: DC
  Administered 2019-05-07 – 2019-05-10 (×4): 40 mg via SUBCUTANEOUS
  Filled 2019-05-07 (×4): qty 0.4

## 2019-05-07 MED ORDER — LACTATED RINGERS IV SOLN
INTRAVENOUS | Status: DC
  Administered 2019-05-07: 1000 mL via INTRAVENOUS

## 2019-05-07 MED ORDER — ALBUTEROL SULFATE (2.5 MG/3ML) 0.083% IN NEBU
2.5000 mg | INHALATION_SOLUTION | RESPIRATORY_TRACT | Status: DC | PRN
  Administered 2019-05-07 – 2019-05-08 (×2): 2.5 mg via RESPIRATORY_TRACT
  Filled 2019-05-07 (×2): qty 3

## 2019-05-07 MED ORDER — INSULIN ASPART 100 UNIT/ML ~~LOC~~ SOLN
0.0000 [IU] | Freq: Every day | SUBCUTANEOUS | Status: DC
  Administered 2019-05-08 (×2): 3 [IU] via SUBCUTANEOUS
  Administered 2019-05-09 – 2019-05-10 (×2): 2 [IU] via SUBCUTANEOUS

## 2019-05-07 MED ORDER — INSULIN ASPART 100 UNIT/ML ~~LOC~~ SOLN
0.0000 [IU] | Freq: Three times a day (TID) | SUBCUTANEOUS | Status: DC
  Administered 2019-05-08: 5 [IU] via SUBCUTANEOUS
  Administered 2019-05-08 (×2): 3 [IU] via SUBCUTANEOUS
  Administered 2019-05-09 (×2): 5 [IU] via SUBCUTANEOUS
  Administered 2019-05-09 – 2019-05-10 (×2): 8 [IU] via SUBCUTANEOUS
  Administered 2019-05-10: 5 [IU] via SUBCUTANEOUS
  Administered 2019-05-10: 11 [IU] via SUBCUTANEOUS
  Administered 2019-05-11: 8 [IU] via SUBCUTANEOUS
  Administered 2019-05-11: 10:00:00 11 [IU] via SUBCUTANEOUS

## 2019-05-07 MED ORDER — CARVEDILOL 25 MG PO TABS
25.0000 mg | ORAL_TABLET | Freq: Two times a day (BID) | ORAL | Status: DC
  Administered 2019-05-08 – 2019-05-11 (×7): 25 mg via ORAL
  Filled 2019-05-07 (×4): qty 1
  Filled 2019-05-07: qty 2
  Filled 2019-05-07 (×3): qty 1

## 2019-05-07 MED ORDER — LOSARTAN POTASSIUM 50 MG PO TABS
50.0000 mg | ORAL_TABLET | Freq: Every day | ORAL | Status: DC
  Administered 2019-05-07 – 2019-05-10 (×4): 50 mg via ORAL
  Filled 2019-05-07 (×4): qty 1

## 2019-05-07 MED ORDER — SODIUM CHLORIDE 0.9 % IV SOLN
500.0000 mg | Freq: Once | INTRAVENOUS | Status: AC
  Administered 2019-05-07: 500 mg via INTRAVENOUS
  Filled 2019-05-07: qty 500

## 2019-05-07 MED ORDER — IPRATROPIUM BROMIDE 0.02 % IN SOLN
0.5000 mg | Freq: Once | RESPIRATORY_TRACT | Status: AC
  Administered 2019-05-07: 0.5 mg via RESPIRATORY_TRACT
  Filled 2019-05-07: qty 2.5

## 2019-05-07 MED ORDER — PANTOPRAZOLE SODIUM 40 MG PO TBEC
40.0000 mg | DELAYED_RELEASE_TABLET | Freq: Every morning | ORAL | Status: DC
  Administered 2019-05-08 – 2019-05-11 (×4): 40 mg via ORAL
  Filled 2019-05-07 (×4): qty 1

## 2019-05-07 NOTE — ED Notes (Signed)
ED TO INPATIENT HANDOFF REPORT  ED Nurse Name and Phone #: Ophelia Charter RN [phone]  S Name/Age/Gender Bradley Jock Sr. 78 y.o. male Room/Bed: 016C/016C  Code Status   Code Status: Prior  Home/SNF/Other Home Patient oriented to: self, place, time and situation Is this baseline? Yes   Triage Complete: Triage complete  Chief Complaint sob  Triage Note C/o sob onset 5 days ago states it is starting to get progressively  Worse , states he has been having chest pain for 4-5 months, states now he isn't able to sleep at night. Bilateral exp  Wheezing. States he is on home 02 at 2-3 liters. Patient is able to speak in complete sentences.    Allergies Allergies  Allergen Reactions  . Hydrocodone Other (See Comments)    "given to him in the hospital; went thru withdrawals once home; dr said not to take it again" (09/27/2012)  . Tramadol Other (See Comments)    "given to him in the hospital; went thru withdrawals once home; dr said not to take it again" (09/27/2012)  . Tadalafil Other (See Comments)    headache, backache  . Avelox [Moxifloxacin Hydrochloride] Itching and Other (See Comments)    Headache    Level of Care/Admitting Diagnosis ED Disposition    ED Disposition Condition Comment   Admit  The patient appears reasonably stabilized for admission considering the current resources, flow, and capabilities available in the ED at this time, and I doubt any other [facility] requiring further screening and/or treatment in the ED prior to admission is  present.       B Medical/Surgery History Past Medical History:  Diagnosis Date  . Abnormal CT scan, chest 2012   CT chest, several lymphadenopathies. Sees pulmonary  . Anemia    intermittent  . Arthritis    "?back" (09/19/2018)  . Atrial fibrillation ([place])    s/p AV node ablation & BiV PPM implantation 09/08/11 (op dictation pending)  . BPH (benign prostatic hyperplasia)    Saw Dr Reece Agar 2004, normal renal u/s  . Bronchitis  08/26/2017  . CAD (coronary artery disease)   . CHF (congestive heart failure) (HCC)    Thought primarily to be non-systolic although EF down (EF 45-50% 12/2010, down to 35-40% 09/05/11), cath 2008 with no CAD, nuclear study 07/2011 showing Small area of reversibility in the distal ant/lat wall the left ventricle suspicious for ischemia/septal wall HK but felt to be low risk  (per D/C Summary 07/2011)  . Chronic bronchitis ([place])    "get it ~ q yr"  . Heart murmur   . HLD (hyperlipidemia)   . HTN (hypertension)   . 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 Insomnia   . Migraines    "very very rare"  . On home oxygen therapy    "2L; 24/7" (09/19/2018)  . Pacemaker   . Peripheral vascular disease ([place])    ??  . Pleural effusion 2008   S/p decortication  . Pulmonary HTN ([place])    per cath 2008  . Type II diabetes mellitus ([place]) 1999   Past Surgical History:  Procedure Laterality Date  . BI-VENTRICULAR PACEMAKER INSERTION Left 09/08/2011   Procedure: BI-VENTRICULAR PACEMAKER INSERTION (CRT-P);  Surgeon: Evans Lance, MD;  Location: [facility] CATH LAB;  Service: Cardiovascular;  Laterality: Left;  . CARDIAC CATHETERIZATION     "couple times; never had balloon or stent" (09/19/2018)  . CATARACT EXTRACTION W/ INTRAOCULAR LENS  IMPLANT, BILATERAL Bilateral 08/2018  .  COLONOSCOPY  03/10/11   normal  . INSERT / REPLACE / REMOVE PACEMAKER  09/08/11   pacemaker placement  . LUNG DECORTICATION    . PLEURAL SCARIFICATION    . pneumothorax with fibrothorax  ~ 2010  . TONSILLECTOMY     "as a kid"      A IV Location/Drains/Wounds Patient Lines/Drains/Airways Status   Active Line/Drains/Airways    Name:   Placement date:   Placement time:   Site:   Days:   Peripheral IV 12/25/18 Posterior;Right Forearm   12/25/18    1145    Forearm   133          Intake/Output Last 24 hours  Intake/Output Summary (Last 24 hours) at 05/07/2019 1404 Last data filed at 05/07/2019  1210 Gross per 24 hour  Intake -  Output 200 ml  Net -200 ml    Labs/Imaging Results for orders placed or performed during the hospital encounter of 05/07/19 (from the past 48 hour(s))  Basic metabolic panel     Status: Abnormal   Collection Time: 05/07/19 10:38 AM  Result Value Ref Range   Sodium 140 135 - 145 mmol/L   Potassium 4.2 3.5 - 5.1 mmol/L   Chloride 102 98 - 111 mmol/L   CO2 29 22 - 32 mmol/L   Glucose, Bld 130 (H) 70 - 99 mg/dL   BUN 8 8 - 23 mg/dL   Creatinine, Ser 3.240.83 0.61 - 1.24 mg/dL   Calcium 8.4 (L) 8.9 - 10.3 mg/dL   GFR calc non Af Amer >60 >60 mL/min   GFR calc Af Amer >60 >60 mL/min   Anion gap 9 5 - 15    Comment: Performed at [facility], [address] [place], [place] [address]  CBC     Status: Abnormal   Collection Time: 05/07/19 10:38 AM  Result Value Ref Range   WBC 6.7 4.0 - 10.5 K/uL   RBC 3.99 (L) 4.22 - 5.81 MIL/uL   Hemoglobin 12.0 (L) 13.0 - 17.0 g/dL   HCT 72.537.7 (L) 36.639.0 - 44.052.0 %   MCV 94.5 80.0 - 100.0 fL   MCH 30.1 26.0 - 34.0 pg   MCHC 31.8 30.0 - 36.0 g/dL   RDW 34.713.1 42.511.5 - 95.615.5 %   Platelets 218 150 - 400 K/uL   nRBC 0.0 0.0 - 0.2 %    Comment: Performed at [facility], [address] [place], [place] [address]  Troponin I (High Sensitivity)     Status: None   Collection Time: 05/07/19 10:38 AM  Result Value Ref Range   Troponin I (High Sensitivity) 5 <18 ng/L    Comment: (NOTE) Elevated high sensitivity troponin I (hsTnI) values and significant  changes across serial measurements may suggest ACS but many other  chronic and acute conditions are known to elevate hsTnI results.  Refer to the "Links" section for chest pain algorithms and additional  guidance. Performed at [facility], [address] [place], [place] [address]   Lactic acid, plasma     Status: None   Collection Time: 05/07/19 10:41 AM  Result Value Ref Range   Lactic Acid, Venous 0.8 0.5 - 1.9 mmol/L    Comment: Performed at [facility], [address] [place], [place] [address]  SARS Coronavirus 2 (CEPHEID - Performed in [facility] hospital lab), Hosp Order     Status: None   Collection Time: 05/07/19 11:19 AM   Specimen: Nasopharyngeal  Swab  Result Value Ref Range   SARS Coronavirus 2 NEGATIVE NEGATIVE    Comment: (NOTE) If result is NEGATIVE SARS-CoV-2 target nucleic acids are NOT DETECTED. The SARS-CoV-2 RNA is generally detectable in upper and lower  respiratory specimens during the acute phase of infection. The lowest  concentration of SARS-CoV-2 viral copies this assay can detect is 250  copies / mL. A negative result does not preclude SARS-CoV-2 infection  and should not be used as the sole basis for treatment or other  patient management decisions.  A negative result may occur with  improper specimen collection / handling, submission of specimen other  than nasopharyngeal swab, presence of viral mutation(s) within the  areas targeted by this assay, and inadequate number of viral copies  (<250 copies / mL). A negative result must be combined with clinical  observations, patient history, and epidemiological information. If result is POSITIVE SARS-CoV-2 target nucleic acids are DETECTED. The SARS-CoV-2 RNA is generally detectable in upper and lower  respiratory specimens dur ing the acute phase of infection.  Positive  results are indicative of active infection with SARS-CoV-2.  Clinical  correlation with patient history and other diagnostic information is  necessary to determine patient infection status.  Positive results do  not rule out bacterial infection or co-infection with other viruses. If result is PRESUMPTIVE POSTIVE SARS-CoV-2 nucleic acids MAY BE PRESENT.   A presumptive positive result was obtained on the submitted specimen  and confirmed on repeat testing.  While 2019 novel coronavirus  (SARS-CoV-2) nucleic acids may be present in the submitted sample  additional confirmatory  testing may be necessary for epidemiological  and / or clinical management purposes  to differentiate between  SARS-CoV-2 and other Sarbecovirus currently known to infect humans.  If clinically indicated additional testing with an alternate test  methodology [phone]) is advised. The SARS-CoV-2 RNA is generally  detectable in upper and lower respiratory sp ecimens during the acute  phase of infection. The expected result is Negative. Fact Sheet for Patients:  BoilerBrush.com.cy Fact Sheet for Healthcare Providers: https://pope.com/ This test is not yet approved or cleared by the [place] FDA and has been authorized for detection and/or diagnosis of SARS-CoV-2 by FDA under an Emergency Use Authorization (EUA).  This EUA will remain in effect (meaning this test can be used) for the duration of the COVID-19 declaration under Section 564(b)(1) of the Act, 21 U.S.C. section 360bbb-3(b)(1), unless the authorization is terminated or revoked sooner. Performed at [facility], [address] [place], [place] [address]   Troponin I (High Sensitivity)     Status: None   Collection Time: 05/07/19 12:31 PM  Result Value Ref Range   Troponin I (High Sensitivity) 6 <18 ng/L    Comment: (NOTE) Elevated high sensitivity troponin I (hsTnI) values and significant  changes across serial measurements may suggest ACS but many other  chronic and acute conditions are known to elevate hsTnI results.  Refer to the "Links" section for chest pain algorithms and additional  guidance. Performed at [facility], [address] [place], [place] [address]    Dg Chest Portable 1 View  Result Date: 05/07/2019 CLINICAL DATA:  Shortness of breath EXAM: PORTABLE CHEST 1 VIEW COMPARISON:  12/21/2018 FINDINGS: Left-sided implanted cardiac device remains in place. Stable cardiomediastinal contours. Calcific aortic knob. Patchy bibasilar airspace  opacities, right worse than left. Probable small right pleural effusion. No pneumothorax. IMPRESSION: Increasing right basilar airspace opacity with probable small right pleural  effusion. Electronically Signed   By: Duanne GuessNicholas  Plundo M.D.   On: 05/07/2019 11:18    Pending Labs Unresulted Labs (From admission, onward)    Start     Ordered   05/07/19 1041  Lactic acid, plasma  Now then every 2 hours,   STAT     05/07/19 1041          Vitals/Pain Today's Vitals   05/07/19 1130 05/07/19 1145 05/07/19 1200 05/07/19 1215  BP: 132/76 134/73 128/78 129/72  Pulse: 69 69  70  Resp: (!) 24 (!) 29 (!) 21 (!) 29  Temp:      TempSrc:      SpO2: 93% 93%  93%  Weight:      Height:      PainSc:        Isolation Precautions No active isolations  Medications Medications  sodium chloride flush (NS) 0.9 % injection 3 mL (3 mLs Intravenous Not Given 05/07/19 1039)  ceFEPIme (MAXIPIME) 2 g in sodium chloride 0.9 % 100 mL IVPB (2 g Intravenous New Bag/Given 05/07/19 1327)  albuterol (PROVENTIL,VENTOLIN) solution continuous neb (has no administration in time range)  ipratropium (ATROVENT) nebulizer solution 0.5 mg (has no administration in time range)  azithromycin (ZITHROMAX) 500 mg in sodium chloride 0.9 % 250 mL IVPB (0 mg Intravenous Stopped 05/07/19 1311)  albuterol (VENTOLIN HFA) 108 (90 Base) MCG/ACT inhaler 6 puff (6 puffs Inhalation Given 05/07/19 1153)    Mobility walks Moderate fall risk   Focused Assessments    R Recommendations: See Admitting Provider Note  Report given to:   Additional Notes:

## 2019-05-07 NOTE — Telephone Encounter (Signed)
Pt. Reports he is on O2 at home and for 3 nights has had increased shortness of breath and "some chest pain.I haven't been able to sleep because of it." No chest pain now. Will call EMS for transport to ED for evaluation.  Reason for Disposition . Sounds like a life-threatening emergency to the triager  Answer Assessment - Initial Assessment Questions 1. RESPIRATORY STATUS: "Describe your breathing?" (e.g., wheezing, shortness of breath, unable to speak, severe coughing)      Shortness of breath 2. ONSET: "When did this breathing problem begin?"      3 nights ago 3. PATTERN "Does the difficult breathing come and go, or has it been constant since it started?"      Worse at night 4. SEVERITY: "How bad is your breathing?" (e.g., mild, moderate, severe)    - MILD: No SOB at rest, mild SOB with walking, speaks normally in sentences, can lay down, no retractions, pulse < 100.    - MODERATE: SOB at rest, SOB with minimal exertion and prefers to sit, cannot lie down flat, speaks in phrases, mild retractions, audible wheezing, pulse 100-120.    - SEVERE: Very SOB at rest, speaks in single words, struggling to breathe, sitting hunched forward, retractions, pulse > 120      Moderate 5. RECURRENT SYMPTOM: "Have you had difficulty breathing before?" If so, ask: "When was the last time?" and "What happened that time?"      Yes 6. CARDIAC HISTORY: "Do you have any history of heart disease?" (e.g., heart attack, angina, bypass surgery, angioplasty)      Yes 7. LUNG HISTORY: "Do you have any history of lung disease?"  (e.g., pulmonary embolus, asthma, emphysema)     COPD 8. CAUSE: "What do you think is causing the breathing problem?"      Unsure 9. OTHER SYMPTOMS: "Do you have any other symptoms? (e.g., dizziness, runny nose, cough, chest pain, fever)     Chest pain - none now 10. PREGNANCY: "Is there any chance you are pregnant?" "When was your last menstrual period?"       N/a 11. TRAVEL: "Have you  traveled out of the country in the last month?" (e.g., travel history, exposures)       No  Protocols used: BREATHING DIFFICULTY-A-AH

## 2019-05-07 NOTE — ED Notes (Signed)
Coffee given to drink

## 2019-05-07 NOTE — H&P (Signed)
History and Physical    Bradley SitJohn J Mittelman Sr. NFA:[id number] DOB: 11/20/1940 DOA: 05/07/2019  PCP: Bradley PlumpPaz, Jose E, MD Consultants:  Bradley [name] - cardiology Patient coming from:  Home - lives with wife, granddaughter, her boyfriend, and their baby; NOK: Wife, [phone]  Chief Complaint: SOB  HPI: Bradley SearlesJohn J Lemma Sr. is a 78 y.o. male with medical history significant of DM; pulmonary HTN; PVD; COPD on home O2, 2-3L; HTN; HLD; CHF; CAD; and afib presenting with SOB.  He was here once before for this same problem.  For the last 3-4 weeks, he has had difficulty sleeping at night - a sleeping pill lets him sleep maybe an hour and then he is up all night; he has pain in his chest; and is very SOB.  He wears O2 at home but it doesn't seem to be helping, he still can't sleep due to SOB.  He uses 2-3L home O2 all the time.  It has gotten worse in the last days.  He does have occasional CP either on the left or the right.  No cough.  No wheezing.  No orthopnea.  No fevers.  No COVID contacts.   ED Course:  COPD exacerbation - 3-4 weeks increased SOB, increased O2 to 4L.  Pursed lips and tripoding.  No COVID exposures and negative.  Wheezing, given nebs.  No apparent recent illness.  Given antibiotics on arrival.  Feels better after nebs but still wheezing.  Review of Systems: As per HPI; otherwise review of systems reviewed and negative.   Ambulatory Status:  Ambulates without assistance  Past Medical History:  Diagnosis Date   Abnormal CT scan, chest 2012   CT chest, several lymphadenopathies. Sees pulmonary   Anemia    intermittent   Arthritis    "?back" (09/19/2018)   Atrial fibrillation (HCC)    s/p AV node ablation & BiV PPM implantation 09/08/11 (op dictation pending)   BPH (benign prostatic hyperplasia)    Saw Dr Bradley PlumpHumphries 2004, normal renal u/s   Bronchitis 08/26/2017   CAD (coronary artery disease)    CHF (congestive heart failure) (HCC)    Thought primarily to be non-systolic  although EF down (EF 45-50% 12/2010, down to 35-40% 09/05/11), cath 2008 with no CAD, nuclear study 07/2011 showing Small area of reversibility in the distal ant/lat wall the left ventricle suspicious for ischemia/septal wall HK but felt to be low risk  (per D/C Summary 07/2011)   Chronic bronchitis (HCC)    "get it ~ q yr"   Heart murmur    HLD (hyperlipidemia)    HTN (hypertension)    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   Pleural effusion 2008   S/p decortication   Pulmonary HTN (HCC)    per cath 2008   Type II diabetes mellitus (HCC) 1999    Past Surgical History:  Procedure Laterality Date   BI-VENTRICULAR PACEMAKER INSERTION Left 09/08/2011   Procedure: BI-VENTRICULAR PACEMAKER INSERTION (CRT-P);  Surgeon: Bradley MawGregg W Taylor, MD;  [facility];  Service: Cardiovascular;  Laterality: Left;   CARDIAC CATHETERIZATION     "couple times; never had balloon or stent" (09/19/2018)   CATARACT EXTRACTION W/ INTRAOCULAR LENS  IMPLANT, BILATERAL Bilateral 08/2018   COLONOSCOPY  03/10/11  normal   INSERT / REPLACE / REMOVE PACEMAKER  09/08/11   pacemaker placement   LUNG DECORTICATION     PLEURAL SCARIFICATION     pneumothorax with fibrothorax  ~ 2010   TONSILLECTOMY     "as a kid"     Social History   Socioeconomic History   Marital status: Married    Spouse name: Bradley [name]   Number of children: 1   Years of education: Not on file   Highest education level: Not on file  Occupational History   Occupation: retired Publishing rights manager: RETIRED  Social Network engineer strain: Not hard at all   Food insecurity    Worry: Never true    Inability: Never true   Transportation needs    Medical: No    Non-medical: No  Tobacco Use   Smoking  status: Former Smoker    Packs/day: 2.50    Years: 25.00    Pack years: 62.50    Types: Cigarettes, Cigars    Quit date: 10/11/1980    Years since quitting: 38.5   Smokeless tobacco: Never Used  Substance and Sexual Activity   Alcohol use: Not Currently    Comment: 1 can of beer ever 4-5 months   Drug use: No   Sexual activity: Not Currently  Lifestyle   Physical activity    Days per week: 6 days    Minutes per session: 20 min   Stress: Not at all  Relationships   Social connections    Talks on phone: More than three times a week    Gets together: More than three times a week    Attends religious service: More than 4 times per year    Active member of club or organization: No    Attends meetings of clubs or organizations: Never    Relationship status: Married   Intimate partner violence    Fear of current or ex partner: Not on file    Emotionally abused: Not on file    Physically abused: Not on file    Forced sexual activity: Not on file  Other Topics Concern   Not on file  Social History Narrative   Moved from [place] 2003.[place]   He lives w/ his wife, [name]   1 son, substance abuse, passed away 10/10/16          Allergies  Allergen Reactions   Hydrocodone Other (See Comments)    "given to him in the hospital; went thru withdrawals once home; dr said not to take it again" (09/27/2012)   Tramadol Other (See Comments)    "given to him in the hospital; went thru withdrawals once home; dr said not to take it again" (09/27/2012)   Tadalafil Other (See Comments)    headache, backache   Avelox [Moxifloxacin Hydrochloride] Itching and Other (See Comments)    Headache    Family History  Problem Relation Age of Onset   Diabetes Father    Coronary artery disease Father    Polycythemia Mother    Drug abuse Son    Breast cancer Maternal Aunt    Prostate cancer Neg Hx    Colon cancer Neg Hx     Prior to Admission medications   Medication Sig Start Date  End Date Taking? Authorizing Provider  acetaminophen (TYLENOL) 500 MG tablet Take 500 mg by mouth every 6 (six) hours as needed for headache.   Yes [provider]  amLODipine (NORVASC) 10 MG  tablet Take 1 tablet (10 mg total) by mouth daily. 10/30/18  Yes Colon Branch, MD  aspirin 81 MG tablet Take 81 mg by mouth every morning.    Yes [provider]  atorvastatin (LIPITOR) 20 MG tablet Take 1 tablet (20 mg total) by mouth at bedtime. 10/30/18  Yes Paz, Alda Berthold, MD  calcium-vitamin D (OSCAL WITH D) 500-200 MG-UNIT tablet Take 1 tablet by mouth every morning.   Yes [provider]  carvedilol (COREG) 25 MG tablet Take 1 tablet (25 mg total) by mouth 2 (two) times daily with a meal. 12/04/18  Yes Paz, Alda Berthold, MD  furosemide (LASIX) 40 MG tablet Take 1 tablet (40 mg total) by mouth daily. 04/16/19  Yes Paz, Alda Berthold, MD  ipratropium-albuterol (DUONEB) 0.5-2.5 (3) MG/3ML SOLN Take 3 mLs by nebulization 2 (two) times daily as needed. 09/25/18  Yes Irene Pap N, DO  losartan (COZAAR) 100 MG tablet Take 0.5 tablets (50 mg total) by mouth at bedtime. 01/22/19  Yes Colon Branch, MD  Multiple Vitamins-Minerals (MULTIVITAMINS THER. W/MINERALS) TABS tablet Take 1 tablet by mouth daily. 09/14/13  Yes Paz, Alda Berthold, MD  nitroGLYCERIN (NITROSTAT) 0.4 MG SL tablet Place 1 tablet (0.4 mg total) under the tongue every 5 (five) minutes x 3 doses as needed for chest pain. 01/10/19  Yes Paz, Alda Berthold, MD  pantoprazole (PROTONIX) 40 MG tablet Take 1 tablet (40 mg total) by mouth every morning. 11/06/18  Yes Paz, Alda Berthold, MD  sitaGLIPtin (JANUVIA) 100 MG tablet Take 1 tablet (100 mg total) by mouth daily. Patient taking differently: Take 100 mg by mouth every morning.  11/27/18  Yes Paz, Alda Berthold, MD  tamsulosin (FLOMAX) 0.4 MG CAPS capsule Take 1 capsule (0.4 mg total) by mouth daily. Patient taking differently: Take 0.4 mg by mouth every morning.  06/19/18  Yes Paz, Alda Berthold, MD  zolpidem (AMBIEN) 10 MG tablet Take  0.5 tablets (5 mg total) by mouth at bedtime as needed for sleep. Patient taking differently: Take 10 mg by mouth at bedtime as needed for sleep.  03/09/19  Yes Paz, Jacqulyn Bath E, MD  budesonide (PULMICORT) 0.5 MG/2ML nebulizer solution Take 2 mLs (0.5 mg total) by nebulization 2 (two) times daily for 30 days. 12/26/18 04/18/19  Damita Lack, MD  Lancets [place] ULTRASOFT) lancets Check blood sugars no more than twice daily 03/08/17   Colon Branch, MD  [facility] VERIO test strip USE TO CHECK BLOOD SUGAR NO MORE THAN TWICE A DAY 02/19/19   Colon Branch, MD    Physical Exam: Vitals:   05/07/19 1644 05/07/19 1645 05/07/19 1700 05/07/19 1715  BP:  140/78 (!) 143/77 138/78  Pulse:  70 70 70  Resp:  (!) 31 (!) 26 (!) 27  Temp:      TempSrc:      SpO2: 94% 94% 98% 98%  Weight:      Height:          General:  Appears SOB with any exertion including conversation, with O2 sats decreasing into mid-80s with conversation and then rebounding with rest  Eyes:  PERRL, EOMI, normal lids, iris  ENT:  grossly normal hearing, lips & tongue, mmm; artificial dentition  Neck:  no LAD, masses or thyromegaly  Cardiovascular:  RRR, no m/r/g. No LE edema.   Pacemaker noted in left upper chest.  Respiratory:   Diffuse wheezing, fairly poor air movement.  Mildly increased respiratory effort.  Abdomen:  soft, NT,  ND, NABS  Skin:  no rash or induration seen on limited exam  Musculoskeletal:  grossly normal tone BUE/BLE, good ROM, no bony abnormality  Psychiatric:  grossly normal mood and affect, speech fluent and appropriate, AOx3  Neurologic:  CN 2-12 grossly intact, moves all extremities in coordinated fashion, sensation intact    Radiological Exams on Admission: Dg Chest Portable 1 View  Result Date: 05/07/2019 CLINICAL DATA:  Shortness of breath EXAM: PORTABLE CHEST 1 VIEW COMPARISON:  12/21/2018 FINDINGS: Left-sided implanted cardiac device remains in place. Stable cardiomediastinal contours.  Calcific aortic knob. Patchy bibasilar airspace opacities, right worse than left. Probable small right pleural effusion. No pneumothorax. IMPRESSION: Increasing right basilar airspace opacity with probable small right pleural effusion. Electronically Signed   By: Duanne GuessNicholas  Plundo M.D.   On: 05/07/2019 11:18    EKG: Independently reviewed.  Ventricularly paced with rate 70    Labs on Admission: I have personally reviewed the available labs and imaging studies at the time of the admission.  Pertinent labs:   Glucose 130 HS troponin 5, 6 Lactate 0.8 WBC 6.7 COVID negative   Assessment/Plan Principal Problem:   COPD (chronic obstructive pulmonary disease) (HCC) Active Problems:   DM II (diabetes mellitus, type II), controlled (HCC)   Essential hypertension   Permanent atrial fibrillation   Chronic diastolic heart failure (HCC)   Acute on chronic respiratory failure with hypoxia (HCC)   Acute on chronic respiratory failure associated with a COPD exacerbation -Patient's shortness of breath and wheezing are most likely caused by acute COPD exacerbation.  -During my evaluation, his O2 sats dropped during conversation but rebounded with rest -Shortly after my evaluation, he got up and went to the bathroom and his sats dropped into the 70s; he was started on BIPAP -He has history of O2-dependent COPD with baseline sats of 88-90% on 2-3L  -He does not have fever or leukocytosis.  -Chest x-ray is not definitely consistent with pneumonia although there may be a RLL consolidation -Will observe on progressive unit for now with ongoing BIPAP -Nebulizers: scheduled Duoneb and prn albuterol -Resume home Pulmicort nebs -Solu-Medrol 80 mg IV BID with plan for transition to PO prednisone tomorrow; if not improving quickly, may need ongoing IV steroids and change to inpatient status -PO Doxycycline for now, but may also need to consider broadening treatment if slow to improve (received Azithro and  Cefepime in the ER)  Chronic diastolic CHF -12/19 echo with preserved EF but high ventricular filling pressure -Appears to be compensated at this time  -Hold Lasix for now, resume when appropriate  DM -3/3 A1c was 5.9 -hold Januvia -Cover with moderate-scale SSI  Afib -Rate controlled with pacer, Coreg and s/p AV nodal ablation -He is not on AC due to h/o ICH  HTN -Continue Norvasc, Coreg, Cozaar   Note: This patient has been tested and is negative for the novel coronavirus COVID-19.    DVT prophylaxis: Lovenox  Code Status:  Full - confirmed with patient Family Communication: None present; I spoke with his wife by telephone Disposition Plan:  Home once clinically improved Consults called: CM/PT/OT/Nutrition/RT  Admission status: It is my clinical opinion that referral for OBSERVATION is reasonable and necessary in this patient based on the above information provided. The aforementioned taken together are felt to place the patient at high risk for further clinical deterioration. However it is anticipated that the patient may be medically stable for discharge from the hospital within 24 to 48 hours.  Jonah Blue MD [facility]   How to contact the [facility] Attending or Consulting provider 7A - 7P or covering provider during after hours 7P -7A, for this patient?  1. Check the care team in [facility] and look for a) attending/consulting [facility] provider listed and b) the [facility] team listed 2. Log into [email or web address] and use [place] universal password to access. If you do not have the password, please contact the hospital operator. 3. Locate the [facility] provider you are looking for under [facility] and page to a number that you can be directly reached. 4. If you still have difficulty reaching the provider, please page the [facility] (Director on Call) for the Hospitalists listed on amion for assistance.   05/07/2019, 5:31 PM

## 2019-05-07 NOTE — ED Triage Notes (Signed)
C/o sob onset 5 days ago states it is starting to get progressively  Worse , states he has been having chest pain for 4-5 months, states now he isn't able to sleep at night. Bilateral exp  Wheezing. States he is on home 02 at 2-3 liters. Patient is able to speak in complete sentences.

## 2019-05-07 NOTE — Progress Notes (Signed)
Pharmacy Antibiotic Note  Bradley LUECKE Sr. is a 78 y.o. male admitted on 05/07/2019 with pneumonia.  Pharmacy has been consulted for cefepime dosing. Also has azithro ordered x 1 per EDP. SCr 0.83 on admit.  Plan: Cefepime 2g IV q8h Monitor clinical progress, c/s, renal function F/u de-escalation plan/LOT  Height: 5\' 9"  (175.3 cm) Weight: 220 lb (99.8 kg) IBW/kg (Calculated) : 70.7  Temp (24hrs), Avg:97.7 F (36.5 C), Min:97.7 F (36.5 C), Max:97.7 F (36.5 C)  Recent Labs  Lab 05/07/19 1038 05/07/19 1041  WBC 6.7  --   CREATININE 0.83  --   LATICACIDVEN  --  0.8    Estimated Creatinine Clearance: 85.4 mL/min (by C-G formula based on SCr of 0.83 mg/dL).    Allergies  Allergen Reactions  . Hydrocodone Other (See Comments)    "given to him in the hospital; went thru withdrawals once home; dr said not to take it again" (09/27/2012)  . Tramadol Other (See Comments)    "given to him in the hospital; went thru withdrawals once home; dr said not to take it again" (09/27/2012)  . Tadalafil Other (See Comments)    headache, backache  . Avelox [Moxifloxacin Hydrochloride] Itching and Other (See Comments)    Headache   Bradley [name], PharmD, BCPS Please check AMION for all [facility] contact numbers Clinical Pharmacist 05/07/2019 11:55 AM

## 2019-05-07 NOTE — Telephone Encounter (Signed)
Noted, thx.

## 2019-05-07 NOTE — ED Provider Notes (Signed)
[facility] Provider Note   CSN: [id number] Arrival date & time: 05/07/19  1010    History   Chief Complaint Chief Complaint  Patient presents with  . Shortness of Breath  . Chest Pain    HPI Bradley SearlesJohn J Naim Sr. is a 78 y.o. male.     HPI  78 year old male with history of coronary artery disease, CHF, COPD on 2 L of oxygen comes in a chief complaint of shortness of breath.  Patient also has history of A. Fib.  He reports that he has been having worsening shortness of breath for the last several days.  Over the last 1 week or so his symptoms have progressed.  He is now feeling short of breath with minimal exertion and also requiring increased oxygen.  Normally he is on 2-3 liters of O2, but now is often requiring 4 L at home.  He has no new cough, fevers, chills.  Patient denies any new chest pain.  He has been wheezing and feeling tight.  He has been taking medications at home with minimal relief.  He denies any sick contacts.  Past Medical History:  Diagnosis Date  . Abnormal CT scan, chest 2012   CT chest, several lymphadenopathies. Sees pulmonary  . Anemia    intermittent  . Arthritis    "?back" (09/19/2018)  . Atrial fibrillation (HCC)    s/p AV node ablation & BiV PPM implantation 09/08/11 (op dictation pending)  . BPH (benign prostatic hyperplasia)    Saw Dr Wanda PlumpHumphries 2004, normal renal u/s  . Bronchitis 08/26/2017  . CAD (coronary artery disease)   . CHF (congestive heart failure) (HCC)    Thought primarily to be non-systolic although EF down (EF 91-47%45-50% 12/2010, down to 35-40% 09/05/11), cath 2008 with no CAD, nuclear study 07/2011 showing Small area of reversibility in the distal ant/lat wall the left ventricle suspicious for ischemia/septal wall HK but felt to be low risk  (per D/C Summary 07/2011)  . Chronic bronchitis (HCC)    "get it ~ q yr"  . Heart murmur   . HLD (hyperlipidemia)   . HTN (hypertension)   . 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 Insomnia   . Migraines    "very very rare"  . On home oxygen therapy    "2L; 24/7" (09/19/2018)  . Pacemaker   . Peripheral vascular disease (HCC)    ??  . Pleural effusion 2008   S/p decortication  . Pulmonary HTN (HCC)    per cath 2008  . Type II diabetes mellitus (HCC) 1999    Patient Active Problem List   Diagnosis Date Noted  . COPD with acute exacerbation (HCC) 12/22/2018  . COPD (chronic obstructive pulmonary disease) (HCC) 12/22/2018  . Acute on chronic respiratory failure with hypoxia (HCC) 05/25/2018  . PCP NOTES >>> 07/15/2015  . Acute bronchitis vs Upper Airway cough syndrome 09/07/2014  . Annual physical exam 05/08/2012  . 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 Lymphadenopathy 09/23/2011  . Sinoatrial node dysfunction (HCC)   . 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 Chronic diastolic heart failure (HCC)   . PAD (peripheral artery disease) (HCC) 10/22/2010  . DERMATITIS, STASIS 09/14/2010  . Chronic respiratory failure with hypoxia (HCC) 09/14/2010  . Chronic pulmonary hypertension secondary to elevated L H pressures  04/15/2009  . ERECTILE DYSFUNCTION 01/06/2009  . Hyperlipidemia 12/13/2007  . Essential hypertension 12/13/2007  . Anxiety--insomnia 08/14/2007  . DM II (diabetes mellitus,  type II), controlled (HCC) 01/02/2007  . Permanent atrial fibrillation 01/02/2007  . Benign enlargement of prostate 01/02/2007    Past Surgical History:  Procedure Laterality Date  . BI-VENTRICULAR PACEMAKER INSERTION Left 09/08/2011   Procedure: BI-VENTRICULAR PACEMAKER INSERTION (CRT-P);  Surgeon: Marinus Maw, MD;  Location: [facility] CATH LAB;  Service: Cardiovascular;  Laterality: Left;  . CARDIAC CATHETERIZATION     "couple times; never had balloon or stent" (09/19/2018)  . CATARACT EXTRACTION W/ INTRAOCULAR LENS  IMPLANT, BILATERAL Bilateral 08/2018  . COLONOSCOPY  03/10/11   normal  . INSERT /  REPLACE / REMOVE PACEMAKER  09/08/11   pacemaker placement  . LUNG DECORTICATION    . PLEURAL SCARIFICATION    . pneumothorax with fibrothorax  ~ 2010  . TONSILLECTOMY     "as a kid"         Home Medications    Prior to Admission medications   Medication Sig Start Date End Date Taking? Authorizing Provider  acetaminophen (TYLENOL) 500 MG tablet Take 500 mg by mouth every 6 (six) hours as needed for headache.   Yes [provider]  amLODipine (NORVASC) 10 MG tablet Take 1 tablet (10 mg total) by mouth daily. 10/30/18  Yes [name], MD  aspirin 81 MG tablet Take 81 mg by mouth every morning.    Yes [provider]  atorvastatin (LIPITOR) 20 MG tablet Take 1 tablet (20 mg total) by mouth at bedtime. 10/30/18  Yes Paz, Nolon Rod, MD  calcium-vitamin D (OSCAL WITH D) 500-200 MG-UNIT tablet Take 1 tablet by mouth every morning.   Yes [provider]  carvedilol (COREG) 25 MG tablet Take 1 tablet (25 mg total) by mouth 2 (two) times daily with a meal. 12/04/18  Yes Paz, Nolon Rod, MD  furosemide (LASIX) 40 MG tablet Take 1 tablet (40 mg total) by mouth daily. 04/16/19  Yes Paz, Nolon Rod, MD  ipratropium-albuterol (DUONEB) 0.5-2.5 (3) MG/3ML SOLN Take 3 mLs by nebulization 2 (two) times daily as needed. 09/25/18  Yes Dow Adolph N, DO  losartan (COZAAR) 100 MG tablet Take 0.5 tablets (50 mg total) by mouth at bedtime. 01/22/19  Yes [name], MD  Multiple Vitamins-Minerals (MULTIVITAMINS THER. W/MINERALS) TABS tablet Take 1 tablet by mouth daily. 09/14/13  Yes Paz, Nolon Rod, MD  nitroGLYCERIN (NITROSTAT) 0.4 MG SL tablet Place 1 tablet (0.4 mg total) under the tongue every 5 (five) minutes x 3 doses as needed for chest pain. 01/10/19  Yes Paz, Nolon Rod, MD  pantoprazole (PROTONIX) 40 MG tablet Take 1 tablet (40 mg total) by mouth every morning. 11/06/18  Yes Paz, Nolon Rod, MD  sitaGLIPtin (JANUVIA) 100 MG tablet Take 1 tablet (100 mg total) by mouth daily. Patient taking differently:  Take 100 mg by mouth every morning.  11/27/18  Yes Paz, Nolon Rod, MD  tamsulosin (FLOMAX) 0.4 MG CAPS capsule Take 1 capsule (0.4 mg total) by mouth daily. Patient taking differently: Take 0.4 mg by mouth every morning.  06/19/18  Yes Paz, Nolon Rod, MD  zolpidem (AMBIEN) 10 MG tablet Take 0.5 tablets (5 mg total) by mouth at bedtime as needed for sleep. Patient taking differently: Take 10 mg by mouth at bedtime as needed for sleep.  03/09/19  Yes Paz, Elita Quick E, MD  budesonide (PULMICORT) 0.5 MG/2ML nebulizer solution Take 2 mLs (0.5 mg total) by nebulization 2 (two) times daily for 30 days. 12/26/18 04/18/19  Dimple Nanas, MD  Lancets [facility] ULTRASOFT) lancets  Check blood sugars no more than twice daily 03/08/17   Wanda PlumpPaz, Jose E, MD  [facility] test strip USE TO CHECK BLOOD SUGAR NO MORE THAN TWICE A DAY 02/19/19   Wanda PlumpPaz, Jose E, MD    Family History Family History  Problem Relation Age of Onset  . Diabetes Father   . Coronary artery disease Father   . Polycythemia Mother   . Drug abuse Son   . Breast cancer Maternal Aunt   . Prostate cancer Neg Hx   . Colon cancer Neg Hx     Social History Social History   Tobacco Use  . Smoking status: Former Smoker    Packs/day: 2.50    Years: 25.00    Pack years: 62.50    Types: Cigarettes, Cigars    Quit date: 10/11/1980    Years since quitting: 38.5  . Smokeless tobacco: Never Used  Substance Use Topics  . Alcohol use: Yes    Comment: 1 can of beer ever 4-5 months  . Drug use: No     Allergies   Hydrocodone, Tramadol, Tadalafil, and Avelox [moxifloxacin hydrochloride]   Review of Systems Review of Systems  Constitutional: Positive for activity change.  Respiratory: Positive for cough and shortness of breath.   Cardiovascular: Negative for chest pain.  Neurological: Positive for dizziness.     Physical Exam Updated Vital Signs BP 129/72   Pulse 70   Temp 97.7 F (36.5 C) (Oral)   Resp (!) 29   Ht 5\' 9"  (1.753 m)   Wt 99.8 kg    SpO2 93%   BMI 32.49 kg/m   Physical Exam Vitals signs and nursing note reviewed.  Constitutional:      Appearance: He is well-developed.  HENT:     Head: Atraumatic.  Neck:     Musculoskeletal: Neck supple.  Cardiovascular:     Rate and Rhythm: Normal rate.  Pulmonary:     Effort: Pulmonary effort is normal.     Breath sounds: Examination of the right-upper field reveals wheezing. Examination of the left-upper field reveals wheezing. Examination of the right-middle field reveals wheezing. Examination of the left-middle field reveals wheezing. Examination of the right-lower field reveals wheezing. Examination of the left-lower field reveals wheezing. Wheezing present. No decreased breath sounds, rhonchi or rales.  Musculoskeletal:     Right lower leg: No edema.     Left lower leg: No edema.  Skin:    General: Skin is warm.  Neurological:     Mental Status: He is alert and oriented to person, place, and time.      ED Treatments / Results  Labs (all labs ordered are listed, but only abnormal results are displayed) Labs Reviewed  BASIC METABOLIC PANEL - Abnormal; Notable for the following components:      Result Value   Glucose, Bld 130 (*)    Calcium 8.4 (*)    All other components within normal limits  CBC - Abnormal; Notable for the following components:   RBC 3.99 (*)    Hemoglobin 12.0 (*)    HCT 37.7 (*)    All other components within normal limits  SARS CORONAVIRUS 2 (HOSPITAL ORDER, PERFORMED IN [facility] LAB)  LACTIC ACID, PLASMA  LACTIC ACID, PLASMA  TROPONIN I (HIGH SENSITIVITY)  TROPONIN I (HIGH SENSITIVITY)    EKG EKG Interpretation  Date/Time:  Monday May 07 2019 10:23:36 EDT Ventricular Rate:  70 PR Interval:    QRS Duration: 128 QT Interval:  406  QTC Calculation: 439 R Axis:   -91 Text Interpretation:  Ventricular-paced complexes No further analysis attempted due to paced rhythm No acute changes paced Confirmed by Derwood Kaplananavati, Annette Bertelson  [phone]([id number]) on 05/07/2019 1:10:19 PM   Radiology Dg Chest Portable 1 View  Result Date: 05/07/2019 CLINICAL DATA:  Shortness of breath EXAM: PORTABLE CHEST 1 VIEW COMPARISON:  12/21/2018 FINDINGS: Left-sided implanted cardiac device remains in place. Stable cardiomediastinal contours. Calcific aortic knob. Patchy bibasilar airspace opacities, right worse than left. Probable small right pleural effusion. No pneumothorax. IMPRESSION: Increasing right basilar airspace opacity with probable small right pleural effusion. Electronically Signed   By: Duanne GuessNicholas  Plundo M.D.   On: 05/07/2019 11:18    Procedures .Critical Care Performed by: Derwood KaplanNanavati, Oneka Parada, MD Authorized by: Derwood KaplanNanavati, Kahlan Engebretson, MD   Critical care provider statement:    Critical care time (minutes):  36   Critical care was necessary to treat or prevent imminent or life-threatening deterioration of the following conditions:  Respiratory failure   Critical care was time spent personally by me on the following activities:  Discussions with consultants, evaluation of patient's response to treatment, examination of patient, ordering and performing treatments and interventions, ordering and review of laboratory studies, ordering and review of radiographic studies, pulse oximetry, re-evaluation of patient's condition, obtaining history from patient or surrogate and review of old charts   (including critical care time)  Medications Ordered in ED Medications  sodium chloride flush (NS) 0.9 % injection 3 mL (3 mLs Intravenous Not Given 05/07/19 1039)  ceFEPIme (MAXIPIME) 2 g in sodium chloride 0.9 % 100 mL IVPB (2 g Intravenous New Bag/Given 05/07/19 1327)  albuterol (PROVENTIL,VENTOLIN) solution continuous neb (has no administration in time range)  ipratropium (ATROVENT) nebulizer solution 0.5 mg (has no administration in time range)  azithromycin (ZITHROMAX) 500 mg in sodium chloride 0.9 % 250 mL IVPB (0 mg Intravenous Stopped 05/07/19 1311)  albuterol  (VENTOLIN HFA) 108 (90 Base) MCG/ACT inhaler 6 puff (6 puffs Inhalation Given 05/07/19 1153)     Initial Impression / Assessment and Plan / ED Course  I have reviewed the triage vital signs and the nursing notes.  Pertinent labs & imaging results that were available during my care of the patient were reviewed by me and considered in my medical decision making (see chart for details).  Clinical Course as of May 07 1355  Mon May 07, 2019  1300 DG Chest Portable 1 View [AN]  1300 CXR shows ? PNA. We will give him antibiotics as there is indeed a new opacity appreciated.    [AN]  1355 Patient continues to wheeze.  Nebulizer treatment ordered.  At 4 L of oxygen he is noted to be saturating at 87%.  He is perfusing his brain okay and there is no respiratory distress at this time.  We will admit him for optimization.   [AN]    Clinical Course User Index [AN] Derwood KaplanNanavati, Clinton Wahlberg, MD       78 year old male comes in a chief complaint of shortness of breath.  He is noted to be hypoxic.  He is also tachypneic.  On exam he has diffuse wheezing.  Patient symptoms have been progressing over the past 3 or 4 days.  He has been actually feeling short of breath much longer than that.  He has no new cough or fevers.  No clear evidence of what might have triggered the current episode.  Patient denies any covert exposures.  Appropriate labs and treatment initiated.  We will reassess.  Patient might need BiPAP if he gets worse.   Bradley Jock Sr. was evaluated in Emergency Department on 05/07/2019 for the symptoms described in the history of present illness. He was evaluated in the context of the global COVID-19 pandemic, which necessitated consideration that the patient might be at risk for infection with the SARS-CoV-2 virus that causes COVID-19. Institutional protocols and algorithms that pertain to the evaluation of patients at risk for COVID-19 are in a state of rapid change based on information released by  regulatory bodies including the CDC and federal and state organizations. These policies and algorithms were followed during the patient's care in the ED.   Final Clinical Impressions(s) / ED Diagnoses   Final diagnoses:  Acute on chronic respiratory failure with hypoxia ([place])  COPD with acute exacerbation [facility])    ED Discharge Orders    None       Varney Biles, MD 05/07/19 1402

## 2019-05-07 NOTE — Telephone Encounter (Signed)
FYI. Going to ED.  

## 2019-05-07 NOTE — ED Notes (Signed)
Dinner tray ordered.

## 2019-05-08 DIAGNOSIS — I110 Hypertensive heart disease with heart failure: Secondary | ICD-10-CM | POA: Diagnosis present

## 2019-05-08 DIAGNOSIS — I4821 Permanent atrial fibrillation: Secondary | ICD-10-CM | POA: Diagnosis present

## 2019-05-08 DIAGNOSIS — Z9981 Dependence on supplemental oxygen: Secondary | ICD-10-CM | POA: Diagnosis not present

## 2019-05-08 DIAGNOSIS — J9621 Acute and chronic respiratory failure with hypoxia: Secondary | ICD-10-CM | POA: Diagnosis present

## 2019-05-08 DIAGNOSIS — Z7951 Long term (current) use of inhaled steroids: Secondary | ICD-10-CM | POA: Diagnosis not present

## 2019-05-08 DIAGNOSIS — J441 Chronic obstructive pulmonary disease with (acute) exacerbation: Secondary | ICD-10-CM | POA: Diagnosis present

## 2019-05-08 DIAGNOSIS — Z23 Encounter for immunization: Secondary | ICD-10-CM | POA: Diagnosis present

## 2019-05-08 DIAGNOSIS — R0602 Shortness of breath: Secondary | ICD-10-CM | POA: Diagnosis present

## 2019-05-08 DIAGNOSIS — Z833 Family history of diabetes mellitus: Secondary | ICD-10-CM | POA: Diagnosis not present

## 2019-05-08 DIAGNOSIS — Z7982 Long term (current) use of aspirin: Secondary | ICD-10-CM | POA: Diagnosis not present

## 2019-05-08 DIAGNOSIS — E785 Hyperlipidemia, unspecified: Secondary | ICD-10-CM | POA: Diagnosis present

## 2019-05-08 DIAGNOSIS — Z6832 Body mass index (BMI) 32.0-32.9, adult: Secondary | ICD-10-CM | POA: Diagnosis not present

## 2019-05-08 DIAGNOSIS — Z79899 Other long term (current) drug therapy: Secondary | ICD-10-CM | POA: Diagnosis not present

## 2019-05-08 DIAGNOSIS — I272 Pulmonary hypertension, unspecified: Secondary | ICD-10-CM | POA: Diagnosis present

## 2019-05-08 DIAGNOSIS — E1151 Type 2 diabetes mellitus with diabetic peripheral angiopathy without gangrene: Secondary | ICD-10-CM | POA: Diagnosis present

## 2019-05-08 DIAGNOSIS — Z87891 Personal history of nicotine dependence: Secondary | ICD-10-CM | POA: Diagnosis not present

## 2019-05-08 DIAGNOSIS — N400 Enlarged prostate without lower urinary tract symptoms: Secondary | ICD-10-CM | POA: Diagnosis present

## 2019-05-08 DIAGNOSIS — I2510 Atherosclerotic heart disease of native coronary artery without angina pectoris: Secondary | ICD-10-CM | POA: Diagnosis present

## 2019-05-08 DIAGNOSIS — Z20828 Contact with and (suspected) exposure to other viral communicable diseases: Secondary | ICD-10-CM | POA: Diagnosis present

## 2019-05-08 DIAGNOSIS — J440 Chronic obstructive pulmonary disease with acute lower respiratory infection: Secondary | ICD-10-CM | POA: Diagnosis not present

## 2019-05-08 DIAGNOSIS — Z7984 Long term (current) use of oral hypoglycemic drugs: Secondary | ICD-10-CM | POA: Diagnosis not present

## 2019-05-08 DIAGNOSIS — Z950 Presence of cardiac pacemaker: Secondary | ICD-10-CM | POA: Diagnosis not present

## 2019-05-08 DIAGNOSIS — I5033 Acute on chronic diastolic (congestive) heart failure: Secondary | ICD-10-CM | POA: Diagnosis present

## 2019-05-08 LAB — BASIC METABOLIC PANEL
Anion gap: 8 (ref 5–15)
BUN: 10 mg/dL (ref 8–23)
CO2: 27 mmol/L (ref 22–32)
Calcium: 8.8 mg/dL — ABNORMAL LOW (ref 8.9–10.3)
Chloride: 100 mmol/L (ref 98–111)
Creatinine, Ser: 0.74 mg/dL (ref 0.61–1.24)
GFR calc Af Amer: >60 mL/min (ref >60)
GFR calc non Af Amer: >60 mL/min (ref >60)
Glucose, Bld: 241 mg/dL — ABNORMAL HIGH (ref 70–99)
Potassium: 4.6 mmol/L (ref 3.5–5.1)
Sodium: 135 mmol/L (ref 135–145)

## 2019-05-08 LAB — PROCALCITONIN: Procalcitonin: <0.1 ng/mL

## 2019-05-08 LAB — CBC
HCT: 38.9 % — ABNORMAL LOW (ref 39.0–52.0)
Hemoglobin: 12.9 g/dL — ABNORMAL LOW (ref 13.0–17.0)
MCH: 30 pg (ref 26.0–34.0)
MCHC: 33.2 g/dL (ref 30.0–36.0)
MCV: 90.5 fL (ref 80.0–100.0)
Platelets: 214 10*3/uL (ref 150–400)
RBC: 4.3 MIL/uL (ref 4.22–5.81)
RDW: 12.8 % (ref 11.5–15.5)
WBC: 3.5 10*3/uL — ABNORMAL LOW (ref 4.0–10.5)
nRBC: 0 % (ref 0.0–0.2)

## 2019-05-08 LAB — HIV ANTIBODY (ROUTINE TESTING W REFLEX): HIV Screen 4th Generation wRfx: NONREACTIVE

## 2019-05-08 LAB — GLUCOSE, CAPILLARY
Glucose-Capillary: 210 mg/dL — ABNORMAL HIGH (ref 70–99)
Glucose-Capillary: 198 mg/dL — ABNORMAL HIGH (ref 70–99)
Glucose-Capillary: 190 mg/dL — ABNORMAL HIGH (ref 70–99)
Glucose-Capillary: 270 mg/dL — ABNORMAL HIGH (ref 70–99)

## 2019-05-08 MED ORDER — ENSURE ENLIVE PO LIQD
237.0000 mL | Freq: Two times a day (BID) | ORAL | Status: DC
  Administered 2019-05-08 – 2019-05-11 (×4): 237 mL via ORAL

## 2019-05-08 MED ORDER — IPRATROPIUM-ALBUTEROL 0.5-2.5 (3) MG/3ML IN SOLN
3.0000 mL | Freq: Two times a day (BID) | RESPIRATORY_TRACT | Status: DC
  Administered 2019-05-08 – 2019-05-11 (×6): 3 mL via RESPIRATORY_TRACT
  Filled 2019-05-08 (×6): qty 3

## 2019-05-08 MED ORDER — ADULT MULTIVITAMIN W/MINERALS CH
1.0000 | ORAL_TABLET | Freq: Every day | ORAL | Status: DC
  Administered 2019-05-08 – 2019-05-10 (×3): 1 via ORAL
  Filled 2019-05-08 (×3): qty 1

## 2019-05-08 MED ORDER — INSULIN GLARGINE 100 UNIT/ML ~~LOC~~ SOLN
10.0000 [IU] | Freq: Every day | SUBCUTANEOUS | Status: DC
  Administered 2019-05-08 – 2019-05-09 (×2): 10 [IU] via SUBCUTANEOUS
  Filled 2019-05-08 (×2): qty 0.1

## 2019-05-08 MED ORDER — METHYLPREDNISOLONE SODIUM SUCC 125 MG IJ SOLR
60.0000 mg | Freq: Two times a day (BID) | INTRAMUSCULAR | Status: DC
  Administered 2019-05-08 – 2019-05-11 (×6): 60 mg via INTRAVENOUS
  Filled 2019-05-08 (×6): qty 2

## 2019-05-08 NOTE — Progress Notes (Signed)
PROGRESS NOTE    Bradley CASTILLA Sr.  BZJ:[id number] DOB: 06-05-1941 DOA: 05/07/2019 PCP: Colon Branch, MD   Brief Narrative:  Bradley Jock Sr. is a 78 y.o. male with medical history significant of DM; pulmonary HTN; PVD; COPD on home O2, 2-3L; HTN; HLD; CHF; CAD; and afib presented to ED on 05/07/2019 with SOB of about 3 to 4 weeks. He was here once before for this same problem.  He also complains of occasional, intermittent chest pain sometimes on the left are right side which resolves on its own.  No cough.  No wheezing.  No orthopnea.  No fevers.  No COVID contacts.  He was diagnosed with COPD exacerbation in the ED and required BiPAP.  He received Solu-Medrol in the ED.  He was admitted under hospitalist service and was also started on doxycycline.  Consultants:   None  Procedures:   None  Antimicrobials:   Doxycycline started on 05/07/2019   Subjective: Patient seen and examined.  He states that he feels slightly better than yesterday.  No new complaint.  He still seemed dyspneic.  Objective: Vitals:   05/08/19 0545 05/08/19 0803 05/08/19 0846 05/08/19 1034  BP: (!) 142/89 138/86    Pulse: 71 70 73   Resp:  19 18   Temp: 98.8 F (37.1 C) 98.4 F (36.9 C)    TempSrc: Oral Oral    SpO2: 91% 93% 92% 90%  Weight: 96.2 kg     Height:        Intake/Output Summary (Last 24 hours) at 05/08/2019 1356 Last data filed at 05/08/2019 1300 Gross per 24 hour  Intake 2357 ml  Output 520 ml  Net 1837 ml   Filed Weights   05/07/19 1035 05/07/19 2148 05/08/19 0545  Weight: 99.8 kg 96.9 kg 96.2 kg    Examination:  General exam: Appears calm and slightly dyspneic Respiratory system: Diminished breath sounds with expiratory wheezes throughout bilaterally. Respiratory effort normal. Cardiovascular system: S1 & S2 heard, RRR. No JVD, murmurs, rubs, gallops or clicks. No pedal edema. Gastrointestinal system: Abdomen is nondistended, soft and nontender. No organomegaly or masses felt.  Normal bowel sounds heard. Central nervous system: Alert and oriented. No focal neurological deficits. Extremities: Symmetric 5 x 5 power. Skin: No rashes, lesions or ulcers Psychiatry: Judgement and insight appear normal. Mood & affect appropriate.    Data Reviewed: I have personally reviewed following labs and imaging studies  CBC: Recent Labs  Lab 05/07/19 1038 05/08/19 0524  WBC 6.7 3.5*  HGB 12.0* 12.9*  HCT 37.7* 38.9*  MCV 94.5 90.5  PLT 218 017   Basic Metabolic Panel: Recent Labs  Lab 05/07/19 1038 05/08/19 0524  NA 140 135  K 4.2 4.6  CL 102 100  CO2 29 27  GLUCOSE 130* 241*  BUN 8 10  CREATININE 0.83 0.74  CALCIUM 8.4* 8.8*   GFR: Estimated Creatinine Clearance: 87.1 mL/min (by C-G formula based on SCr of 0.74 mg/dL). Liver Function Tests: No results for input(s): AST, ALT, ALKPHOS, BILITOT, PROT, ALBUMIN in the last 168 hours. No results for input(s): LIPASE, AMYLASE in the last 168 hours. No results for input(s): AMMONIA in the last 168 hours. Coagulation Profile: No results for input(s): INR, PROTIME in the last 168 hours. Cardiac Enzymes: No results for input(s): CKTOTAL, CKMB, CKMBINDEX, TROPONINI in the last 168 hours. BNP (last 3 results) No results for input(s): PROBNP in the last 8760 hours. HbA1C: No results for input(s): HGBA1C in the last  72 hours. CBG: Recent Labs  Lab 05/07/19 1820 05/07/19 2158 05/08/19 0726 05/08/19 1142  GLUCAP 132* 191* 210* 198*   Lipid Profile: No results for input(s): CHOL, HDL, LDLCALC, TRIG, CHOLHDL, LDLDIRECT in the last 72 hours. Thyroid Function Tests: No results for input(s): TSH, T4TOTAL, FREET4, T3FREE, THYROIDAB in the last 72 hours. Anemia Panel: No results for input(s): VITAMINB12, FOLATE, FERRITIN, TIBC, IRON, RETICCTPCT in the last 72 hours. Sepsis Labs: Recent Labs  Lab 05/07/19 1041  LATICACIDVEN 0.8    Recent Results (from the past 240 hour(s))  SARS Coronavirus 2 (CEPHEID -  Performed in [facility] hospital lab), Hosp Order     Status: None   Collection Time: 05/07/19 11:19 AM   Specimen: Nasopharyngeal Swab  Result Value Ref Range Status   SARS Coronavirus 2 NEGATIVE NEGATIVE Final    Comment: (NOTE) If result is NEGATIVE SARS-CoV-2 target nucleic acids are NOT DETECTED. The SARS-CoV-2 RNA is generally detectable in upper and lower  respiratory specimens during the acute phase of infection. The lowest  concentration of SARS-CoV-2 viral copies this assay can detect is 250  copies / mL. A negative result does not preclude SARS-CoV-2 infection  and should not be used as the sole basis for treatment or other  patient management decisions.  A negative result may occur with  improper specimen collection / handling, submission of specimen other  than nasopharyngeal swab, presence of viral mutation(s) within the  areas targeted by this assay, and inadequate number of viral copies  (<250 copies / mL). A negative result must be combined with clinical  observations, patient history, and epidemiological information. If result is POSITIVE SARS-CoV-2 target nucleic acids are DETECTED. The SARS-CoV-2 RNA is generally detectable in upper and lower  respiratory specimens dur ing the acute phase of infection.  Positive  results are indicative of active infection with SARS-CoV-2.  Clinical  correlation with patient history and other diagnostic information is  necessary to determine patient infection status.  Positive results do  not rule out bacterial infection or co-infection with other viruses. If result is PRESUMPTIVE POSTIVE SARS-CoV-2 nucleic acids MAY BE PRESENT.   A presumptive positive result was obtained on the submitted specimen  and confirmed on repeat testing.  While 2019 novel coronavirus  (SARS-CoV-2) nucleic acids may be present in the submitted sample  additional confirmatory testing may be necessary for epidemiological  and / or clinical management  purposes  to differentiate between  SARS-CoV-2 and other Sarbecovirus currently known to infect humans.  If clinically indicated additional testing with an alternate test  methodology [phone]) is advised. The SARS-CoV-2 RNA is generally  detectable in upper and lower respiratory sp ecimens during the acute  phase of infection. The expected result is Negative. Fact Sheet for Patients:  BoilerBrush.com.cy Fact Sheet for Healthcare Providers: https://pope.com/ This test is not yet approved or cleared by the [place] FDA and has been authorized for detection and/or diagnosis of SARS-CoV-2 by FDA under an Emergency Use Authorization (EUA).  This EUA will remain in effect (meaning this test can be used) for the duration of the COVID-19 declaration under Section 564(b)(1) of the Act, 21 U.S.C. section 360bbb-3(b)(1), unless the authorization is terminated or revoked sooner. Performed at [facility], [address], [place], [place] [address]       Radiology Studies: Dg Chest Portable 1 View  Result Date: 05/07/2019 CLINICAL DATA:  Shortness of breath EXAM: PORTABLE CHEST 1 VIEW COMPARISON:  12/21/2018 FINDINGS: Left-sided implanted  cardiac device remains in place. Stable cardiomediastinal contours. Calcific aortic knob. Patchy bibasilar airspace opacities, right worse than left. Probable small right pleural effusion. No pneumothorax. IMPRESSION: Increasing right basilar airspace opacity with probable small right pleural effusion. Electronically Signed   By: Duanne GuessNicholas  Plundo M.D.   On: 05/07/2019 11:18    Scheduled Meds: . amLODipine  10 mg Oral Daily  . aspirin EC  81 mg Oral q morning - 10a  . atorvastatin  20 mg Oral QHS  . budesonide  0.5 mg Nebulization BID  . carvedilol  25 mg Oral BID WC  . doxycycline  100 mg Oral Q12H  . enoxaparin (LOVENOX) injection  40 mg Subcutaneous Q24H  . feeding supplement (ENSURE ENLIVE)  237  mL Oral BID BM  . insulin aspart  0-15 Units Subcutaneous TID WC  . insulin aspart  0-5 Units Subcutaneous QHS  . insulin glargine  10 Units Subcutaneous Daily  . ipratropium-albuterol  3 mL Nebulization Q6H  . losartan  50 mg Oral QHS  . methylPREDNISolone (SOLU-MEDROL) injection  60 mg Intravenous Q12H  . multivitamin with minerals  1 tablet Oral Q1500  . pantoprazole  40 mg Oral q morning - 10a  . tamsulosin  0.4 mg Oral q morning - 10a   Continuous Infusions:   LOS: 0 days   Assessment & Plan:   Principal Problem:   COPD (chronic obstructive pulmonary disease) (HCC) Active Problems:   DM II (diabetes mellitus, type II), controlled (HCC)   Essential hypertension   Permanent atrial fibrillation   Chronic diastolic heart failure (HCC)   Acute on chronic respiratory failure with hypoxia (HCC)   Acute on chronic hypoxic respiratory failure secondary to acute COPD exacerbation: Although his oxygenation has improved significantly and he is now on only 4 L of oxygen however he seems to be very dyspneic so I will switch him back to IV Solu-Medrol 60 mg twice daily, continue PRN DuoNeb as well as doxycycline.  Right lower lobe infiltrate/?community-acquired vs aspiration pneumonia: Chest x-ray shows possibility of right lower lobe infiltrate.  Does not have any fever or leukocytosis.  Continue doxycycline.  Check procalcitonin.  Chronic diastolic congestive heart failure: Echo done in December 2019 showed preserved ejection fraction.  He appears to be compensated.  Will resume Lasix.  Type 2 diabetes mellitus: Takes Januvia at home.  Blood sugar slightly elevated.  Expect this to be even higher now that he is on IV Solu-Medrol.  We will start him on Lantus 10 units.  Continue SSI.  Essential hypertension: Pressure control.  Continue home medications.  Hyperlipidemia: Continue atorvastatin.  DVT prophylaxis: Lovenox  Code Status: Full code  Family Communication: None  present/requsted Disposition Plan: TBD   Time spent: 3737 MIN    Hughie Clossavi Azeem Poorman, MD [facility] Pager [phone]  If 7PM-7AM, please contact night-coverage [email or web address] Password [email or web address] 05/08/2019, 1:56 PM

## 2019-05-08 NOTE — Evaluation (Signed)
Physical Therapy Evaluation Patient Details Name: Bradley ALMONTE Sr. MRN: [id number] DOB: [birth year] Today's Date: 05/08/2019   History of Present Illness  Patient is a 78 year old male admitted from home with increased difficulty breathing. PMH to include O2 dependent COPD, CHF, DM, pacemaker, Afib.  Clinical Impression  Patient agreeable to PT evaluation. "Tell me what you want me to do." Ambulated 40 feet without AD, slightly impulsive with lines, leads requiring assistance to manage.  Patient steady with ambulation, limited by respiratory status and O2 sats declining with activity. Patient on supplemental O2 throughout session. Patient will benefit from continued skilled PT to improve activity tolerance.    Follow Up Recommendations Home health PT    Equipment Recommendations  None recommended by PT    Recommendations for Other Services       Precautions / Restrictions Precautions Precautions: None Restrictions Weight Bearing Restrictions: No      Mobility  Bed Mobility Overal bed mobility: Independent                Transfers Overall transfer level: Independent Equipment used: None                Ambulation/Gait Ambulation/Gait assistance: Land (Feet): 50 Feet Assistive device: None Gait Pattern/deviations: WFL(Within Functional Limits)     General Gait Details: patient ambulates without difficulty, no AD, however is unaware of limitations with breathing. Cues to breathe in through nose, deep breathing.  Stairs            Wheelchair Mobility    Modified Rankin (Stroke Patients Only)       Balance Overall balance assessment: Independent                                           Pertinent Vitals/Pain Pain Assessment: Faces Faces Pain Scale: Hurts a little bit Pain Location: chest tightness Pain Descriptors / Indicators: Discomfort;Tightness Pain Intervention(s): Monitored during session    Home  Living Family/patient expects to be discharged to:: Private residence Living Arrangements: Spouse/significant other;Children Available Help at Discharge: Family;Available 24 hours/day Type of Home: House Home Access: Level entry     Home Layout: Multi-level Home Equipment: Walker - 2 wheels;Shower seat      Prior Function Level of Independence: Independent         Comments: Goes shopping with wife with portable oxygen, does daily walks to mailbox     Hand Dominance   Dominant Hand: Right    Extremity/Trunk Assessment   Upper Extremity Assessment Upper Extremity Assessment: Overall WFL for tasks assessed    Lower Extremity Assessment Lower Extremity Assessment: Overall WFL for tasks assessed    Cervical / Trunk Assessment Cervical / Trunk Assessment: Normal  Communication   Communication: No difficulties  Cognition Arousal/Alertness: Awake/alert Behavior During Therapy: Impulsive Overall Cognitive Status: Within Functional Limits for tasks assessed                                        General Comments      Exercises     Assessment/Plan    PT Assessment Patient needs continued PT services  PT Problem List Decreased safety awareness;Decreased activity tolerance;Cardiopulmonary status limiting activity;Decreased knowledge of precautions       PT Treatment Interventions Gait training;Functional mobility  training;Therapeutic exercise;Patient/family education    PT Goals (Current goals can be found in the Care Plan section)  Acute Rehab PT Goals Patient Stated Goal: to return home when ready, when breathing better PT Goal Formulation: With patient Time For Goal Achievement: 05/15/19 Potential to Achieve Goals: Good    Frequency Min 2X/week   Barriers to discharge        Co-evaluation               AM-PAC PT "6 Clicks" Mobility  Outcome Measure Help needed turning from your back to your side while in a flat bed without using  bedrails?: None Help needed moving from lying on your back to sitting on the side of a flat bed without using bedrails?: None Help needed moving to and from a bed to a chair (including a wheelchair)?: A Little Help needed standing up from a chair using your arms (e.g., wheelchair or bedside chair)?: A Little Help needed to walk in hospital room?: A Little Help needed climbing 3-5 steps with a railing? : A Little 6 Click Score: 20    End of Session Equipment Utilized During Treatment: Gait belt;Oxygen Activity Tolerance: Other (comment)(limited by O2 sats) Patient left: in bed;with call bell/phone within reach Nurse Communication: Mobility status;Other (comment)(O2 sats with walking) PT Visit Diagnosis: Difficulty in walking, not elsewhere classified (R26.2)    Time: 1000-1023 PT Time Calculation (min) (ACUTE ONLY): 23 min   Charges:   PT Evaluation $PT Eval Moderate Complexity: 1 Mod PT Treatments $Gait Training: 8-22 mins        Storm Sovine, PT, GCS 05/08/19,10:46 AM

## 2019-05-08 NOTE — Progress Notes (Signed)
Initial Nutrition Assessment  DOCUMENTATION CODES:   Obesity unspecified  INTERVENTION:  -Ensure Enlive po BID, each supplement provides 350 kcal and 20 grams of protein -Continue to monitor meal intake; consider snacks in between meals with decreased po -MVI with minerals   NUTRITION DIAGNOSIS:   Increased nutrient needs related to chronic illness(COPD on 2-3L home O2) as evidenced by estimated needs.    GOAL:   Patient will meet greater than or equal to 90% of their needs   MONITOR:   PO intake, Supplement acceptance, I & O's, Labs, Weight trends  REASON FOR ASSESSMENT:   Consult Assessment of nutrition requirement/status  ASSESSMENT:  RD working remotely.  78 year old male with medical history significant of T2DM, HTN, PVD, CAD, CHF s/p cath, bi-ventricular pacemaker, a-fib, COPD on 2-3L O2 at home. Patient presented to ED with SOB and difficulties sleeping x 3-4 weeks. Patient admitted with COPD exacerbation and started on BIPAP after O2 stats dropped during conversation with inpatient provider.  RD consulted for assessment of nutritional status. Unable to reach patient today via phone.  Patient with good po; eating 100% of 2 recorded meals. Current CHO mod diet provides 1759 kcals and 71 grams protien daily;  RD to order Ensure BID to assist with increased calorie and protein needs of patient.  Weight reviewed - +5.9 lb in 4 months; suspect increased wt related to fluid status current wt 96.2 kg; +1 mild pitting BLE 3/16: 93.5 kg  Per chart review; pt seen by RD during previous admission 05/26/18 who noted pt had lost 9 lbs in a 2 month period. Pt experiencing decreased appetite at that time related to increasing SOB. NFPE completed; no fat or muscle wasting at that time  Medications reviewed and include: Pulmicort, doxycycline, SSI,  Duoneb, Protonix, Prednisone  Labs: Glucose 241 (H) Hgb 12.9 Lab Results  Component Value Date   HGBA1C 5.9 12/12/2018      NUTRITION - FOCUSED PHYSICAL EXAM: Unable to complete at this time   Diet Order:   Diet Order            Diet Carb Modified Fluid consistency: Thin; Room service appropriate? Yes  Diet effective now              EDUCATION NEEDS:   Not appropriate for education at this time  Skin:  Skin Assessment: Reviewed RN Assessment  Last BM:  7/26  Height:   Ht Readings from Last 1 Encounters:  05/07/19 5\' 9"  (1.753 m)    Weight:   Wt Readings from Last 1 Encounters:  05/08/19 96.2 kg    Ideal Body Weight:  72.7 kg  BMI:  Body mass index is 31.32 kg/m.  Estimated Nutritional Needs:   Kcal:  2100-2300  Protein:  105-115 grams  Fluid:  >2.1L   Lajuan Lines, RD, LDN  After Hours/Weekend Pager: [phone]

## 2019-05-08 NOTE — Evaluation (Signed)
Occupational Therapy Evaluation Patient Details Name: Bradley CUTLER Sr. MRN: [id number] DOB: 1941-09-11 Today's Date: 05/08/2019    History of Present Illness Patient is a 78 year old male admitted from home with increased difficulty breathing. PMH to include O2 dependent COPD, CHF, DM, pacemaker, Afib.   Clinical Impression   This 78 y/o male presents with the above. PTA pt reports mod independence with ADL and functional mobility. Pt presenting with decreased activity tolerance, increased SOB with room level activity this session. Pt able to perform room level mobility without AD and minguard assist. He currently requires minguard assist with standing grooming and LB ADL, setup/supervision for seated UB ADL. Pt on 5L O2 during session, O2 sats maintaining >90% with brief standing activity, once returned to sitting O2 sats dropped to 84%, increasing back to 90% with seated rest and cues for deep breathing. HR noted briefly up to 150s with activity. He will benefit from continued acute OT services to further address energy conservation techniques and to maximize his safety and independence with ADL and mobility; will follow.     Follow Up Recommendations  No OT follow up;Supervision/Assistance - 24 hour(24hr initially)    Equipment Recommendations  None recommended by OT           Precautions / Restrictions Precautions Precautions: None Precaution Comments: watch O2 sats Restrictions Weight Bearing Restrictions: No      Mobility Bed Mobility Overal bed mobility: Needs Assistance Bed Mobility: Supine to Sit;Sit to Supine     Supine to sit: Supervision;HOB elevated Sit to supine: Supervision;HOB elevated   General bed mobility comments: for lines/safety  Transfers Overall transfer level: Needs assistance Equipment used: None Transfers: Sit to/from Stand Sit to Stand: Supervision         General transfer comment: for lines, safety and balance    Balance Overall balance  assessment: No apparent balance deficits (not formally assessed)                                         ADL either performed or assessed with clinical judgement   ADL Overall ADL's : Needs assistance/impaired Eating/Feeding: Modified independent;Sitting   Grooming: Wash/dry hands;Min guard;Standing   Upper Body Bathing: Set up;Min guard;Sitting   Lower Body Bathing: Min guard;Sit to/from stand   Upper Body Dressing : Set up;Min guard;Sitting   Lower Body Dressing: Minimal assistance;Sit to/from stand   Toilet Transfer: Min guard;Ambulation Toilet Transfer Details (indicate cue type and reason): simulated via transfer to/from EOB Toileting- Clothing Manipulation and Hygiene: Min guard;Sit to/from stand       Functional mobility during ADLs: Min guard General ADL Comments: pt mostly limited due to decreased activity tolerance, increased WOB with minimal activity, initiated education of energy conservation techniques     Vision         Perception     Praxis      Pertinent Vitals/Pain Pain Assessment: No/denies pain     Hand Dominance Right   Extremity/Trunk Assessment Upper Extremity Assessment Upper Extremity Assessment: Overall WFL for tasks assessed   Lower Extremity Assessment Lower Extremity Assessment: Defer to PT evaluation       Communication Communication Communication: No difficulties   Cognition Arousal/Alertness: Awake/alert Behavior During Therapy: WFL for tasks assessed/performed Overall Cognitive Status: Within Functional Limits for tasks assessed  General Comments       Exercises     Shoulder Instructions      Home Living Family/patient expects to be discharged to:: Private residence Living Arrangements: Spouse/significant other;Children Available Help at Discharge: Family;Available 24 hours/day Type of Home: House Home Access: Level entry     Home Layout:  Multi-level Alternate Level Stairs-Number of Steps: 1 flight Alternate Level Stairs-Rails: Right Bathroom Shower/Tub: Producer, television/film/video: Standard     Home Equipment: Environmental consultant - 2 wheels;Shower seat - built in;Grab bars - tub/shower          Prior Functioning/Environment Level of Independence: Independent        Comments: Goes shopping with wife with portable oxygen, does daily walks to [employer]        OT Problem List: Decreased strength;Decreased activity tolerance;Decreased knowledge of precautions;Cardiopulmonary status limiting activity      OT Treatment/Interventions: Self-care/ADL training;Therapeutic exercise;Energy conservation;DME and/or AE instruction;Therapeutic activities;Patient/family education;Balance training    OT Goals(Current goals can be found in the care plan section) Acute Rehab OT Goals Patient Stated Goal: to return home when ready, when breathing better OT Goal Formulation: With patient Time For Goal Achievement: 05/22/19 Potential to Achieve Goals: Good  OT Frequency: Min 2X/week   Barriers to D/C:            Co-evaluation              AM-PAC OT "6 Clicks" Daily Activity     Outcome Measure Help from another person eating meals?: None Help from another person taking care of personal grooming?: None Help from another person toileting, which includes using toliet, bedpan, or urinal?: A Little Help from another person bathing (including washing, rinsing, drying)?: None Help from another person to put on and taking off regular upper body clothing?: None Help from another person to put on and taking off regular lower body clothing?: A Little 6 Click Score: 22   End of Session Equipment Utilized During Treatment: Oxygen Nurse Communication: Mobility status  Activity Tolerance: Patient tolerated treatment well Patient left: in bed;with call bell/phone within reach  OT Visit Diagnosis: Muscle weakness (generalized)  (M62.81)(decreased activity tolerance)                Time: 4403-4742 OT Time Calculation (min): 23 min Charges:  OT General Charges $OT Visit: 1 Visit OT Evaluation $OT Eval Moderate Complexity: 1 Mod OT Treatments $Self Care/Home Management : 8-22 mins  Marcy Siren, OT Supplemental Rehabilitation Services Pager [phone] Office [phone]   Bradley [name] 05/08/2019, 4:02 PM

## 2019-05-09 ENCOUNTER — Inpatient Hospital Stay (HOSPITAL_COMMUNITY): Payer: Medicare Other

## 2019-05-09 LAB — CBC WITH DIFFERENTIAL/PLATELET
Abs Immature Granulocytes: 0.03 10*3/uL (ref 0.00–0.07)
Basophils Absolute: 0 10*3/uL (ref 0.0–0.1)
Basophils Relative: 0 %
Eosinophils Absolute: 0 10*3/uL (ref 0.0–0.5)
Eosinophils Relative: 0 %
HCT: 36.4 % — ABNORMAL LOW (ref 39.0–52.0)
Hemoglobin: 12.1 g/dL — ABNORMAL LOW (ref 13.0–17.0)
Immature Granulocytes: 0 %
Lymphocytes Relative: 9 %
Lymphs Abs: 0.7 10*3/uL (ref 0.7–4.0)
MCH: 30.3 pg (ref 26.0–34.0)
MCHC: 33.2 g/dL (ref 30.0–36.0)
MCV: 91 fL (ref 80.0–100.0)
Monocytes Absolute: 0.2 10*3/uL (ref 0.1–1.0)
Monocytes Relative: 3 %
Neutro Abs: 7.1 10*3/uL (ref 1.7–7.7)
Neutrophils Relative %: 88 %
Platelets: 199 10*3/uL (ref 150–400)
RBC: 4 MIL/uL — ABNORMAL LOW (ref 4.22–5.81)
RDW: 13 % (ref 11.5–15.5)
WBC: 8 10*3/uL (ref 4.0–10.5)
nRBC: 0 % (ref 0.0–0.2)

## 2019-05-09 LAB — BASIC METABOLIC PANEL
Anion gap: 8 (ref 5–15)
BUN: 14 mg/dL (ref 8–23)
CO2: 27 mmol/L (ref 22–32)
Calcium: 8.9 mg/dL (ref 8.9–10.3)
Chloride: 103 mmol/L (ref 98–111)
Creatinine, Ser: 0.78 mg/dL (ref 0.61–1.24)
GFR calc Af Amer: >60 mL/min (ref >60)
GFR calc non Af Amer: >60 mL/min (ref >60)
Glucose, Bld: 187 mg/dL — ABNORMAL HIGH (ref 70–99)
Potassium: 5.1 mmol/L (ref 3.5–5.1)
Sodium: 138 mmol/L (ref 135–145)

## 2019-05-09 LAB — GLUCOSE, CAPILLARY
Glucose-Capillary: 234 mg/dL — ABNORMAL HIGH (ref 70–99)
Glucose-Capillary: 298 mg/dL — ABNORMAL HIGH (ref 70–99)
Glucose-Capillary: 224 mg/dL — ABNORMAL HIGH (ref 70–99)
Glucose-Capillary: 215 mg/dL — ABNORMAL HIGH (ref 70–99)

## 2019-05-09 LAB — MAGNESIUM: Magnesium: 2.1 mg/dL (ref 1.7–2.4)

## 2019-05-09 MED ORDER — FUROSEMIDE 10 MG/ML IJ SOLN
80.0000 mg | Freq: Once | INTRAMUSCULAR | Status: AC
  Administered 2019-05-09: 80 mg via INTRAVENOUS
  Filled 2019-05-09: qty 8

## 2019-05-09 MED ORDER — IOHEXOL 350 MG/ML SOLN
100.0000 mL | Freq: Once | INTRAVENOUS | Status: AC | PRN
  Administered 2019-05-09: 100 mL via INTRAVENOUS

## 2019-05-09 MED ORDER — INSULIN GLARGINE 100 UNIT/ML ~~LOC~~ SOLN
20.0000 [IU] | Freq: Every day | SUBCUTANEOUS | Status: DC
  Administered 2019-05-10: 20 [IU] via SUBCUTANEOUS
  Filled 2019-05-09: qty 0.2

## 2019-05-09 MED ORDER — INSULIN GLARGINE 100 UNIT/ML ~~LOC~~ SOLN
10.0000 [IU] | Freq: Once | SUBCUTANEOUS | Status: AC
  Administered 2019-05-09: 10 [IU] via SUBCUTANEOUS
  Filled 2019-05-09: qty 0.1

## 2019-05-09 NOTE — Progress Notes (Signed)
Results for BERNABE, DORCE (MRN [id number]) as of 05/09/2019 12:12  Ref. Range 05/08/2019 16:47 05/08/2019 21:17 05/09/2019 07:39 05/09/2019 11:32  Glucose-Capillary Latest Ref Range: 70 - 99 mg/dL 190 (H) 270 (H) 234 (H) 298 (H)  Noted that postprandial blood sugars continue to be greater than 180 mg/dl.   Recommend adding Novolog 3 units TID with meals if patient eats at least 50% of meal and while on steroids. Will continue to monitor blood sugars while in the hospital.  Harvel Ricks RN BSN CDE Diabetes Coordinator Pager: [phone]  8am-5pm

## 2019-05-09 NOTE — TOC Initial Note (Signed)
Transition of Care (TOC) - Initial/Assessment Note    Patient Details  Name: Bradley WITTMAN Sr. MRN: [id number] Date of Birth: January 17, 1941  Transition of [facility]) CM/SW Contact:    Gala Lewandowsky, RN Phone Number: 05/09/2019, 1:19 PM  Clinical Narrative:   Pt presented for  COPD exacerbation. PTA from home with spouse. Plan to return home at transition. CM did offer choice and patient had used [facility] in the past. Patient wants to use again. Referral made to [facility] with [facility] and SOC to begin within 24-48 hours of transition home. Pt has DME oxygen. CM will continue to monitor for additional transition of care needs.    Expected Discharge Plan: Home w Home Health Services Barriers to Discharge: Continued Medical Work up   Patient Goals and CMS Choice Patient states their goals for this hospitalization and ongoing recovery are:: " to return home" CMS Medicare.gov Compare Post Acute Care list provided to:: Patient Choice offered to / list presented to : Patient  Expected Discharge Plan and Services Expected Discharge Plan: Home w Home Health Services In-house Referral: NA Discharge Planning Services: CM Consult Post Acute Care Choice: Home Health Living arrangements for the past 2 months: Single Family Home                           HH Arranged: RN, Disease Management, PT(COPD Protocol) HH Agency: [facility] Date HH Agency Contacted: 05/09/19 Time HH Agency Contacted: [phone] Representative spoke with at [facility] Agency: Vikki Ports  Prior Living Arrangements/Services Living arrangements for the past 2 months: Single Family Home Lives with:: Spouse Patient language and need for interpreter reviewed:: Yes Do you feel safe going back to the place where you live?: Yes      Need for Family Participation in Patient Care: Yes (Comment) Care giver support system in place?: Yes (comment) Current home services: DME(Patient has DME oxygen 3-4 Liters at  home.) Criminal Activity/Legal Involvement Pertinent to Current Situation/Hospitalization: No - Comment as needed  Activities of Daily Living Home Assistive Devices/Equipment: None ADL Screening (condition at time of admission) Patient's cognitive ability adequate to safely complete daily activities?: Yes Is the patient deaf or have difficulty hearing?: No Does the patient have difficulty seeing, even when wearing glasses/contacts?: No Does the patient have difficulty concentrating, remembering, or making decisions?: No Patient able to express need for assistance with ADLs?: Yes Does the patient have difficulty dressing or bathing?: No Independently performs ADLs?: Yes (appropriate for developmental age) Does the patient have difficulty walking or climbing stairs?: Yes Weakness of Legs: None Weakness of Arms/Hands: None  Permission Sought/Granted Permission sought to share information with : Family Supports, Oceanographer granted to share information with : Yes, Verbal Permission Granted     Permission granted to share info w AGENCY: [facility]        Emotional Assessment Appearance:: Appears stated age Attitude/Demeanor/Rapport: Engaged Affect (typically observed): Accepting Orientation: : Oriented to  Time Alcohol / Substance Use: Not Applicable Psych Involvement: No (comment)  Admission diagnosis:  COPD with acute exacerbation (HCC) [J44.1] Acute on chronic respiratory failure with hypoxia (HCC) [J96.21] COPD (chronic obstructive pulmonary disease) (HCC) [J44.9] Patient Active Problem List   Diagnosis Date Noted  . COPD with acute exacerbation (HCC) 12/22/2018  . COPD (chronic obstructive pulmonary disease) (HCC) 12/22/2018  . Acute on chronic respiratory failure with hypoxia (HCC) 05/25/2018  . PCP NOTES >>> 07/15/2015  . Acute bronchitis  vs Upper Airway cough syndrome 09/07/2014  . Annual physical exam 05/08/2012  .  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 Lymphadenopathy 09/23/2011  . Sinoatrial node dysfunction (HCC)   . Non-ischemic cardiomyopathy ([place]) 09/06/2011  . Chronic diastolic heart failure ([place])   . PAD (peripheral artery disease) ([place]) 10/22/2010  . DERMATITIS, STASIS 09/14/2010  . Chronic respiratory failure with hypoxia ([place]) 09/14/2010  . Chronic pulmonary hypertension secondary to elevated L H pressures  04/15/2009  . ERECTILE DYSFUNCTION 01/06/2009  . Hyperlipidemia 12/13/2007  . Essential hypertension 12/13/2007  . Anxiety--insomnia 08/14/2007  . DM II (diabetes mellitus, type II), controlled ([place]) 01/02/2007  . Permanent atrial fibrillation 01/02/2007  . Benign enlargement of prostate 01/02/2007   PCP:  Colon Branch, MD Pharmacy:   [facility] [phone] - [place], [place] [address] AT [place] [address] [place] [address] Phone: [phone] Fax: [phone]  [facility] [place], [place] [address] [place] [address] Phone: [phone] Fax: [phone]     Social Determinants of Health (SDOH) Interventions    Readmission Risk Interventions No flowsheet data found.

## 2019-05-09 NOTE — Progress Notes (Signed)
PROGRESS NOTE    Bradley SearlesJohn J Yahr Sr.  AVW:[id number] DOB: 05/01/1941 DOA: 05/07/2019 PCP: Wanda PlumpPaz, Jose E, MD   Brief Narrative:  Bradley SearlesJohn J Baer Sr. is a 78 y.o. male with medical history significant of DM; pulmonary HTN; PVD; COPD on home O2, 2-3L; HTN; HLD; CHF; CAD; and afib presented to ED on 05/07/2019 with SOB of about 3 to 4 weeks. He was here once before for this same problem.  He also complains of occasional, intermittent chest pain sometimes on the left are right side which resolves on its own.  No cough.  No wheezing.  No orthopnea.  No fevers.  No COVID contacts.  He was diagnosed with COPD exacerbation in the ED and required BiPAP.  He received Solu-Medrol in the ED.  He was admitted under hospitalist service and was also started on doxycycline.  Consultants:   None  Procedures:   None  Antimicrobials:   Doxycycline started on 05/07/2019   Subjective: Patient seen and examined.  Feels better however he did complain of some chest pain now and then along with some abdominal pain and he also told me that his abdominal pain has been going on intermittently for past 4 months and he has been evaluated by his PCP and was given some medication so that is not an issue for him.  He is having good bowel movements and passing gas.  His chest pains are going on for about 2 years as well.  They usually last for 20 to 25 minutes and disappear.  He does not have any nausea, diaphoresis or dizziness along with the chest pain however he does have shortness of breath which is also chronic for him.  Objective: Vitals:   05/09/19 0800 05/09/19 0900 05/09/19 0934 05/09/19 1000  BP:    (!) 114/99  Pulse: 75 70 82 75  Resp:   16   Temp:      TempSrc:      SpO2: (!) 88% 96% 93% 92%  Weight:      Height:        Intake/Output Summary (Last 24 hours) at 05/09/2019 1419 Last data filed at 05/09/2019 1205 Gross per 24 hour  Intake 960 ml  Output 1175 ml  Net -215 ml   Filed Weights   05/07/19 2148  05/08/19 0545 05/09/19 0448  Weight: 96.9 kg 96.2 kg 97.4 kg    Examination:  General exam: Appears calm but slightly dyspneic Respiratory system: Crackles at the bases with diminished breath sounds and expiratory wheezes.  Slightly dyspneic Cardiovascular system: S1 & S2 heard, RRR. No JVD, murmurs, rubs, gallops or clicks.  +2 pitting edema bilateral lower extremity Gastrointestinal system: Abdomen is nondistended, soft and nontender. No organomegaly or masses felt. Normal bowel sounds heard. Central nervous system: Alert and oriented. No focal neurological deficits. Extremities: Symmetric 5 x 5 power. Skin: No rashes, lesions or ulcers Psychiatry: Judgement and insight appear normal. Mood & affect appropriate.    Data Reviewed: I have personally reviewed following labs and imaging studies  CBC: Recent Labs  Lab 05/07/19 1038 05/08/19 0524 05/09/19 0434  WBC 6.7 3.5* 8.0  NEUTROABS  --   --  7.1  HGB 12.0* 12.9* 12.1*  HCT 37.7* 38.9* 36.4*  MCV 94.5 90.5 91.0  PLT 218 214 199   Basic Metabolic Panel: Recent Labs  Lab 05/07/19 1038 05/08/19 0524 05/09/19 0434  NA 140 135 138  K 4.2 4.6 5.1  CL 102 100 103  CO2 29 27  27  GLUCOSE 130* 241* 187*  BUN 8 10 14   CREATININE 0.83 0.74 0.78  CALCIUM 8.4* 8.8* 8.9  MG  --   --  2.1   GFR: Estimated Creatinine Clearance: 87.6 mL/min (by C-G formula based on SCr of 0.78 mg/dL). Liver Function Tests: No results for input(s): AST, ALT, ALKPHOS, BILITOT, PROT, ALBUMIN in the last 168 hours. No results for input(s): LIPASE, AMYLASE in the last 168 hours. No results for input(s): AMMONIA in the last 168 hours. Coagulation Profile: No results for input(s): INR, PROTIME in the last 168 hours. Cardiac Enzymes: No results for input(s): CKTOTAL, CKMB, CKMBINDEX, TROPONINI in the last 168 hours. BNP (last 3 results) No results for input(s): PROBNP in the last 8760 hours. HbA1C: No results for input(s): HGBA1C in the last 72  hours. CBG: Recent Labs  Lab 05/08/19 1142 05/08/19 1647 05/08/19 2117 05/09/19 0739 05/09/19 1132  GLUCAP 198* 190* 270* 234* 298*   Lipid Profile: No results for input(s): CHOL, HDL, LDLCALC, TRIG, CHOLHDL, LDLDIRECT in the last 72 hours. Thyroid Function Tests: No results for input(s): TSH, T4TOTAL, FREET4, T3FREE, THYROIDAB in the last 72 hours. Anemia Panel: No results for input(s): VITAMINB12, FOLATE, FERRITIN, TIBC, IRON, RETICCTPCT in the last 72 hours. Sepsis Labs: Recent Labs  Lab 05/07/19 1041 05/08/19 1434  PROCALCITON  --  <0.10  LATICACIDVEN 0.8  --     Recent Results (from the past 240 hour(s))  SARS Coronavirus 2 (CEPHEID - Performed in [facility] hospital lab), Hosp Order     Status: None   Collection Time: 05/07/19 11:19 AM   Specimen: Nasopharyngeal Swab  Result Value Ref Range Status   SARS Coronavirus 2 NEGATIVE NEGATIVE Final    Comment: (NOTE) If result is NEGATIVE SARS-CoV-2 target nucleic acids are NOT DETECTED. The SARS-CoV-2 RNA is generally detectable in upper and lower  respiratory specimens during the acute phase of infection. The lowest  concentration of SARS-CoV-2 viral copies this assay can detect is 250  copies / mL. A negative result does not preclude SARS-CoV-2 infection  and should not be used as the sole basis for treatment or other  patient management decisions.  A negative result may occur with  improper specimen collection / handling, submission of specimen other  than nasopharyngeal swab, presence of viral mutation(s) within the  areas targeted by this assay, and inadequate number of viral copies  (<250 copies / mL). A negative result must be combined with clinical  observations, patient history, and epidemiological information. If result is POSITIVE SARS-CoV-2 target nucleic acids are DETECTED. The SARS-CoV-2 RNA is generally detectable in upper and lower  respiratory specimens dur ing the acute phase of infection.   Positive  results are indicative of active infection with SARS-CoV-2.  Clinical  correlation with patient history and other diagnostic information is  necessary to determine patient infection status.  Positive results do  not rule out bacterial infection or co-infection with other viruses. If result is PRESUMPTIVE POSTIVE SARS-CoV-2 nucleic acids MAY BE PRESENT.   A presumptive positive result was obtained on the submitted specimen  and confirmed on repeat testing.  While 2019 novel coronavirus  (SARS-CoV-2) nucleic acids may be present in the submitted sample  additional confirmatory testing may be necessary for epidemiological  and / or clinical management purposes  to differentiate between  SARS-CoV-2 and other Sarbecovirus currently known to infect humans.  If clinically indicated additional testing with an alternate test  methodology [phone]) is advised. The SARS-CoV-2 RNA  is generally  detectable in upper and lower respiratory sp ecimens during the acute  phase of infection. The expected result is Negative. Fact Sheet for Patients:  BoilerBrush.com.cyhttps://www.fda.gov/media/136312/download Fact Sheet for Healthcare Providers: https://pope.com/https://www.fda.gov/media/136313/download This test is not yet approved or cleared by the [place] FDA and has been authorized for detection and/or diagnosis of SARS-CoV-2 by FDA under an Emergency Use Authorization (EUA).  This EUA will remain in effect (meaning this test can be used) for the duration of the COVID-19 declaration under Section 564(b)(1) of the Act, 21 U.S.C. section 360bbb-3(b)(1), unless the authorization is terminated or revoked sooner. Performed at [facility], [address] [place], [place] [address]       Radiology Studies: Ct Angio Chest Pe W Or Wo Contrast  Result Date: 05/09/2019 CLINICAL DATA:  Shortness of breath and chest pain EXAM: CT ANGIOGRAPHY CHEST WITH CONTRAST TECHNIQUE: Multidetector CT imaging of the chest was  performed using the standard protocol during bolus administration of intravenous contrast. Multiplanar CT image reconstructions and MIPs were obtained to evaluate the vascular anatomy. CONTRAST:  80 mL OMNIPAQUE IOHEXOL 350 MG/ML SOLN COMPARISON:  Chest radiograph May 07, 2019; CT angiogram chest May 24, 2012 FINDINGS: Cardiovascular: There is no demonstrable pulmonary embolus. There is no thoracic aortic aneurysm. No dissection evident. It should be noted that the contrast bolus within the aorta is not sufficient for confident dissection exclusion. Visualized great vessels show scattered foci of calcification. Note that the right innominate and left common carotid arteries arise as a common trunk, an anatomic variant. There is aortic atherosclerosis as well as foci of coronary artery calcification. Pacemaker leads are attached to the right heart and coronary sinus. Heart is mildly enlarged. Mediastinum/Nodes: Thyroid appears unremarkable. There are scattered subcentimeter mediastinal lymph nodes. There is a lymph node in the aortopulmonary window region measuring 1.4 x 1.5 cm. There is a lymph node to the right of the trachea near the carina measuring 1.9 x 1.7 cm. There is a lymph node in the right paratracheal region measuring 1.8 x 1.7 cm. There is a subcarinal lymph node measuring 1.9 x 1.9 cm. No esophageal lesions are evident. Lungs/Pleura: There are small pleural effusions bilaterally. There is compressive atelectasis in the left lower lobe period there are areas of mosaic attenuation throughout the lungs bilaterally. There is a degree of interstitial pulmonary edema in the lower lobe regions. Upper Abdomen: There is reflux of contrast into the inferior vena cava and hepatic veins. Visualized upper abdominal structures otherwise appear unremarkable. Musculoskeletal: There are no blastic or lytic bone lesions. Pacemaker device present in the left upper thorax anteriorly. Review of the MIP images confirms  the above findings. IMPRESSION: 1. No demonstrable pulmonary embolus. No thoracic aortic aneurysm. No dissection evident; it must be noted that the contrast bolus in the aorta is insufficient to exclude dissection confidently. There is aortic atherosclerosis as well as foci of great vessel and coronary artery calcification. 2. Small pleural effusions with a degree of interstitial edema. Suspect a degree of underlying congestive heart failure. There is compressive atelectasis in the left base. No frank airspace consolidation noted. 3. Mosaic attenuation in the lungs may be indicative of a degree of small airways obstructive disease. 4.  Several enlarged lymph nodes of uncertain etiology. 5. Reflux of contrast into the inferior vena cava and hepatic veins, a finding suggesting increase in right heart pressure. 6.  Pacemaker present. Aortic Atherosclerosis (ICD10-I70.0). Electronically Signed   By: Bretta BangWilliam  Woodruff III M.D.  On: 05/09/2019 12:26    Scheduled Meds: . amLODipine  10 mg Oral Daily  . aspirin EC  81 mg Oral q morning - 10a  . atorvastatin  20 mg Oral QHS  . budesonide  0.5 mg Nebulization BID  . carvedilol  25 mg Oral BID WC  . doxycycline  100 mg Oral Q12H  . enoxaparin (LOVENOX) injection  40 mg Subcutaneous Q24H  . feeding supplement (ENSURE ENLIVE)  237 mL Oral BID BM  . insulin aspart  0-15 Units Subcutaneous TID WC  . insulin aspart  0-5 Units Subcutaneous QHS  . insulin glargine  10 Units Subcutaneous Daily  . ipratropium-albuterol  3 mL Nebulization BID  . losartan  50 mg Oral QHS  . methylPREDNISolone (SOLU-MEDROL) injection  60 mg Intravenous Q12H  . multivitamin with minerals  1 tablet Oral Q1500  . pantoprazole  40 mg Oral q morning - 10a  . tamsulosin  0.4 mg Oral q morning - 10a   Continuous Infusions:   LOS: 1 day   Assessment & Plan:   Principal Problem:   COPD (chronic obstructive pulmonary disease) (HCC) Active Problems:   DM II (diabetes mellitus, type  II), controlled ([place])   Essential hypertension   Permanent atrial fibrillation   Chronic diastolic heart failure (HCC)   Acute on chronic respiratory failure with hypoxia (HCC)   Acute on chronic hypoxic respiratory failure secondary to acute COPD exacerbation and acute on chronic diastolic congestive heart failure: Due to his chest pain, I ordered CT angiogram of the chest and he was ruled out of PE however this shows possibility of pulmonary edema/congestive heart failure.  He did have some crackles on exam and bilateral lower extremity edema.  His ejection fraction was 55% on the last echo done in December 2019.  He takes Lasix 40 p.o. daily at home which has been on hold.  He is positive fluid balance of about 1.5 L since admission.  At this point in time I will give him a large dose of Lasix 80 mg IV x1 and monitor strict I's and O's.  Low-sodium diet.  I will also continue current dose of Solu-Medrol and DuoNeb.  Continue doxycycline.    Right lower lobe infiltrate/?community-acquired vs aspiration pneumonia: Chest x-ray shows possibility of right lower lobe infiltrate however CT chest did not mention anything like that and procalcitonin also unremarkable.  Does not have any fever or leukocytosis.  Continue doxycycline.   Type 2 diabetes mellitus: Takes Januvia at home.  Blood sugar slightly elevated as expected due to him being on steroids.  Will increase Lantus to 20 units and continue sliding scale.  Essential hypertension: Blood pressure control.  Continue home medications.  Hyperlipidemia: Continue atorvastatin.  Chronic intermittent chest pain: EKG with no acute ST-T wave changes.  Troponin x2-.  CT chest ruled out PE.  DVT prophylaxis: Lovenox  Code Status: Full code  Family Communication: None present/requsted.  He told me already spoke to his wife. Disposition Plan: Home with home health likely tomorrow.  Time spent: 32 minutes.  Darliss Cheney, MD [facility] Pager  [phone]  If 7PM-7AM, please contact night-coverage [email or web address] Password [email or web address] 05/09/2019, 2:19 PM

## 2019-05-10 LAB — CBC WITH DIFFERENTIAL/PLATELET
Abs Immature Granulocytes: 0.04 10*3/uL (ref 0.00–0.07)
Basophils Absolute: 0 10*3/uL (ref 0.0–0.1)
Basophils Relative: 0 %
Eosinophils Absolute: 0 10*3/uL (ref 0.0–0.5)
Eosinophils Relative: 0 %
HCT: 36.9 % — ABNORMAL LOW (ref 39.0–52.0)
Hemoglobin: 12.2 g/dL — ABNORMAL LOW (ref 13.0–17.0)
Immature Granulocytes: 1 %
Lymphocytes Relative: 9 %
Lymphs Abs: 0.6 10*3/uL — ABNORMAL LOW (ref 0.7–4.0)
MCH: 30 pg (ref 26.0–34.0)
MCHC: 33.1 g/dL (ref 30.0–36.0)
MCV: 90.7 fL (ref 80.0–100.0)
Monocytes Absolute: 0.3 10*3/uL (ref 0.1–1.0)
Monocytes Relative: 4 %
Neutro Abs: 5.8 10*3/uL (ref 1.7–7.7)
Neutrophils Relative %: 86 %
Platelets: 190 10*3/uL (ref 150–400)
RBC: 4.07 MIL/uL — ABNORMAL LOW (ref 4.22–5.81)
RDW: 12.9 % (ref 11.5–15.5)
WBC: 6.7 10*3/uL (ref 4.0–10.5)
nRBC: 0 % (ref 0.0–0.2)

## 2019-05-10 LAB — GLUCOSE, CAPILLARY
Glucose-Capillary: 249 mg/dL — ABNORMAL HIGH (ref 70–99)
Glucose-Capillary: 251 mg/dL — ABNORMAL HIGH (ref 70–99)
Glucose-Capillary: 327 mg/dL — ABNORMAL HIGH (ref 70–99)
Glucose-Capillary: 240 mg/dL — ABNORMAL HIGH (ref 70–99)

## 2019-05-10 LAB — BASIC METABOLIC PANEL
Anion gap: 8 (ref 5–15)
BUN: 19 mg/dL (ref 8–23)
CO2: 32 mmol/L (ref 22–32)
Calcium: 8.9 mg/dL (ref 8.9–10.3)
Chloride: 99 mmol/L (ref 98–111)
Creatinine, Ser: 0.8 mg/dL (ref 0.61–1.24)
GFR calc Af Amer: >60 mL/min (ref >60)
GFR calc non Af Amer: >60 mL/min (ref >60)
Glucose, Bld: 188 mg/dL — ABNORMAL HIGH (ref 70–99)
Potassium: 4.5 mmol/L (ref 3.5–5.1)
Sodium: 139 mmol/L (ref 135–145)

## 2019-05-10 MED ORDER — INSULIN GLARGINE 100 UNIT/ML ~~LOC~~ SOLN: 25.0000 [IU] | Freq: Every day | SUBCUTANEOUS | Status: DC

## 2019-05-10 MED ORDER — INSULIN GLARGINE 100 UNIT/ML ~~LOC~~ SOLN
25.0000 [IU] | Freq: Every day | SUBCUTANEOUS | Status: DC
  Administered 2019-05-11: 25 [IU] via SUBCUTANEOUS
  Filled 2019-05-10 (×2): qty 0.25

## 2019-05-10 MED ORDER — FUROSEMIDE 10 MG/ML IJ SOLN
80.0000 mg | Freq: Once | INTRAMUSCULAR | Status: AC
  Administered 2019-05-10: 80 mg via INTRAVENOUS
  Filled 2019-05-10: qty 8

## 2019-05-10 NOTE — Progress Notes (Signed)
Physical Therapy Treatment Patient Details Name: Bradley [name]. MRN: [id number] DOB: [birth year] Today's Date: 05/10/2019    History of Present Illness 78 year old male admitted from home with increased difficulty breathing. PMH to include O2 dependent COPD, CHF, DM, pacemaker, Afib.    PT Comments    Pt was seen for gait on the hall and noted desaturation at the halfway point.  Drops to 86% and at home per pt is 90% baseline with gait.  Pt was able to push the O2 tank but took it for him to avoid the extra work as his sats did not support hallway gait.  Follow up acutely for progressing endurance activities with monitoring of his tolerance and report to medical staff.  Follow Up Recommendations  Home health PT     Equipment Recommendations  None recommended by PT    Recommendations for Other Services       Precautions / Restrictions Precautions Precautions: None Precaution Comments: watch O2 sats Restrictions Weight Bearing Restrictions: No    Mobility  Bed Mobility Overal bed mobility: Needs Assistance             General bed mobility comments: sitting on side of bed when PT arrived  Transfers Overall transfer level: Needs assistance Equipment used: None Transfers: Sit to/from Stand Sit to Stand: Supervision         General transfer comment: for lines, safety and balance  Ambulation/Gait Ambulation/Gait assistance: Min guard Gait Distance (Feet): 80 Feet Assistive device: None Gait Pattern/deviations: Step-through pattern;Wide base of support;Decreased stride length Gait velocity: reduced Gait velocity interpretation: <1.31 ft/sec, indicative of household ambulator General Gait Details: pt initially pushed the O2 tank but sats dropped to 86% with effort, sat to rest then returned to his room and was 86% again on 6L   Stairs             Wheelchair Mobility    Modified Rankin (Stroke Patients Only)       Balance                                            Cognition Arousal/Alertness: Awake/alert Behavior During Therapy: WFL for tasks assessed/performed Overall Cognitive Status: Within Functional Limits for tasks assessed                                        Exercises      General Comments General comments (skin integrity, edema, etc.): pt is mainly struggling with O2 sats but not with balance and endurance      Pertinent Vitals/Pain Pain Assessment: No/denies pain    Home Living                      Prior Function            PT Goals (current goals can now be found in the care plan section) Acute Rehab PT Goals Patient Stated Goal: go home tomorrow Progress towards PT goals: Progressing toward goals    Frequency    Min 2X/week      PT Plan Current plan remains appropriate    Co-evaluation              AM-PAC PT "6 Clicks" Mobility   Outcome Measure  Help needed turning  from your back to your side while in a flat bed without using bedrails?: None Help needed moving from lying on your back to sitting on the side of a flat bed without using bedrails?: None Help needed moving to and from a bed to a chair (including a wheelchair)?: A Little Help needed standing up from a chair using your arms (e.g., wheelchair or bedside chair)?: A Little Help needed to walk in hospital room?: A Little Help needed climbing 3-5 steps with a railing? : A Lot 6 Click Score: 19    End of Session Equipment Utilized During Treatment: Gait belt;Oxygen Activity Tolerance: Patient limited by fatigue;Treatment limited secondary to medical complications (Comment) Patient left: in bed;with call bell/phone within reach Nurse Communication: Mobility status;Other (comment) PT Visit Diagnosis: Difficulty in walking, not elsewhere classified (R26.2)     Time: 5027-7412 PT Time Calculation (min) (ACUTE ONLY): 23 min  Charges:  $Gait Training: 8-22 mins $Therapeutic Activity:  8-22 mins                    Ivar Drape 05/10/2019, 1:45 PM    Samul Dada, PT MS Acute Rehab Dept. Number: [facility] [phone] and [facility] [phone]

## 2019-05-10 NOTE — Progress Notes (Signed)
PROGRESS NOTE    Bradley WERTH Sr.  SEG:[id number] DOB: 1941-07-25 DOA: 05/07/2019 PCP: Colon Branch, MD   Brief Narrative:  Bradley Jock Sr. is a 78 y.o. male with medical history significant of DM; pulmonary HTN; PVD; COPD on home O2, 2-3L; HTN; HLD; CHF; CAD; and afib presented to ED on 05/07/2019 with SOB of about 3 to 4 weeks. He was here once before for this same problem.  He also complains of occasional, intermittent chest pain sometimes on the left are right side which resolves on its own.  No cough.  No orthopnea.  No fevers.  No COVID contacts.  He was diagnosed with COPD exacerbation in the ED and required BiPAP.  He received Solu-Medrol in the ED.  He was admitted under hospitalist service and was also started on doxycycline.  EKG did not show any acute ST-T wave changes.  Cardiac enzymes were negative.  He continued to complain of chest pain is in suspicion for PE so CT angiogram of the chest was obtained and he was pulled out of PE however he was having some pulmonary edema.  Has history of diastolic congestive heart failure and takes Lasix at home.  Has been started on Lasix 80 mg IV daily starting 05/09/2019.  Consultants:   None  Procedures:   None  Antimicrobials:   Doxycycline started on 05/07/2019   Subjective: Patient seen and examined.  He states that he has diuresed significantly since he received Lasix yesterday and feels much better today but is still on 5 L oxygen.  Although feeling better but is still not comfortable going home today and is requesting to keep him 1 more day.  Objective: Vitals:   05/09/19 2100 05/09/19 2209 05/10/19 0446 05/10/19 0815  BP: 131/79  133/79 138/84  Pulse: 72  79 71  Resp:      Temp:  (!) 97.5 F (36.4 C) 97.7 F (36.5 C)   TempSrc:  Oral Oral   SpO2: 97%  93% 92%  Weight:   95.4 kg   Height:        Intake/Output Summary (Last 24 hours) at 05/10/2019 0901 Last data filed at 05/10/2019 0800 Gross per 24 hour  Intake 942 ml    Output 2000 ml  Net -1058 ml   Filed Weights   05/08/19 0545 05/09/19 0448 05/10/19 0446  Weight: 96.2 kg 97.4 kg 95.4 kg    Examination:  General exam: Appears calm and comfortable, morbidly obese. Respiratory system: Diminished breath sounds with bibasilar fine crackles, no wheezes. Respiratory effort normal. Cardiovascular system: S1 & S2 heard, RRR. No JVD, murmurs, rubs, gallops or clicks.  +2 pitting edema bilateral lower extremity. Gastrointestinal system: Abdomen is nondistended, soft and nontender. No organomegaly or masses felt. Normal bowel sounds heard. Central nervous system: Alert and oriented. No focal neurological deficits. Extremities: Symmetric 5 x 5 power. Skin: No rashes, lesions or ulcers Psychiatry: Judgement and insight appear normal. Mood & affect appropriate.   Data Reviewed: I have personally reviewed following labs and imaging studies  CBC: Recent Labs  Lab 05/07/19 1038 05/08/19 0524 05/09/19 0434 05/10/19 0350  WBC 6.7 3.5* 8.0 6.7  NEUTROABS  --   --  7.1 5.8  HGB 12.0* 12.9* 12.1* 12.2*  HCT 37.7* 38.9* 36.4* 36.9*  MCV 94.5 90.5 91.0 90.7  PLT 218 214 199 737   Basic Metabolic Panel: Recent Labs  Lab 05/07/19 1038 05/08/19 0524 05/09/19 0434 05/10/19 0350  NA 140 135 138 139  K 4.2 4.6 5.1 4.5  CL 102 100 103 99  CO2 29 27 27  32  GLUCOSE 130* 241* 187* 188*  BUN 8 10 14 19   CREATININE 0.83 0.74 0.78 0.80  CALCIUM 8.4* 8.8* 8.9 8.9  MG  --   --  2.1  --    GFR: Estimated Creatinine Clearance: 86.8 mL/min (by C-G formula based on SCr of 0.8 mg/dL). Liver Function Tests: No results for input(s): AST, ALT, ALKPHOS, BILITOT, PROT, ALBUMIN in the last 168 hours. No results for input(s): LIPASE, AMYLASE in the last 168 hours. No results for input(s): AMMONIA in the last 168 hours. Coagulation Profile: No results for input(s): INR, PROTIME in the last 168 hours. Cardiac Enzymes: No results for input(s): CKTOTAL, CKMB, CKMBINDEX,  TROPONINI in the last 168 hours. BNP (last 3 results) No results for input(s): PROBNP in the last 8760 hours. HbA1C: No results for input(s): HGBA1C in the last 72 hours. CBG: Recent Labs  Lab 05/09/19 0739 05/09/19 1132 05/09/19 1657 05/09/19 2210 05/10/19 0803  GLUCAP 234* 298* 224* 215* 249*   Lipid Profile: No results for input(s): CHOL, HDL, LDLCALC, TRIG, CHOLHDL, LDLDIRECT in the last 72 hours. Thyroid Function Tests: No results for input(s): TSH, T4TOTAL, FREET4, T3FREE, THYROIDAB in the last 72 hours. Anemia Panel: No results for input(s): VITAMINB12, FOLATE, FERRITIN, TIBC, IRON, RETICCTPCT in the last 72 hours. Sepsis Labs: Recent Labs  Lab 05/07/19 1041 05/08/19 1434  PROCALCITON  --  <0.10  LATICACIDVEN 0.8  --     Recent Results (from the past 240 hour(s))  SARS Coronavirus 2 (CEPHEID - Performed in [facility] lab), Hosp Order     Status: None   Collection Time: 05/07/19 11:19 AM   Specimen: Nasopharyngeal Swab  Result Value Ref Range Status   SARS Coronavirus 2 NEGATIVE NEGATIVE Final    Comment: (NOTE) If result is NEGATIVE SARS-CoV-2 target nucleic acids are NOT DETECTED. The SARS-CoV-2 RNA is generally detectable in upper and lower  respiratory specimens during the acute phase of infection. The lowest  concentration of SARS-CoV-2 viral copies this assay can detect is 250  copies / mL. A negative result does not preclude SARS-CoV-2 infection  and should not be used as the sole basis for treatment or other  patient management decisions.  A negative result may occur with  improper specimen collection / handling, submission of specimen other  than nasopharyngeal swab, presence of viral mutation(s) within the  areas targeted by this assay, and inadequate number of viral copies  (<250 copies / mL). A negative result must be combined with clinical  observations, patient history, and epidemiological information. If result is POSITIVE SARS-CoV-2  target nucleic acids are DETECTED. The SARS-CoV-2 RNA is generally detectable in upper and lower  respiratory specimens dur ing the acute phase of infection.  Positive  results are indicative of active infection with SARS-CoV-2.  Clinical  correlation with patient history and other diagnostic information is  necessary to determine patient infection status.  Positive results do  not rule out bacterial infection or co-infection with other viruses. If result is PRESUMPTIVE POSTIVE SARS-CoV-2 nucleic acids MAY BE PRESENT.   A presumptive positive result was obtained on the submitted specimen  and confirmed on repeat testing.  While 2019 novel coronavirus  (SARS-CoV-2) nucleic acids may be present in the submitted sample  additional confirmatory testing may be necessary for epidemiological  and / or clinical management purposes  to differentiate between  SARS-CoV-2 and other Sarbecovirus  currently known to infect humans.  If clinically indicated additional testing with an alternate test  methodology [phone]) is advised. The SARS-CoV-2 RNA is generally  detectable in upper and lower respiratory sp ecimens during the acute  phase of infection. The expected result is Negative. Fact Sheet for Patients:  BoilerBrush.com.cy Fact Sheet for Healthcare Providers: https://pope.com/ This test is not yet approved or cleared by the [place] FDA and has been authorized for detection and/or diagnosis of SARS-CoV-2 by FDA under an Emergency Use Authorization (EUA).  This EUA will remain in effect (meaning this test can be used) for the duration of the COVID-19 declaration under Section 564(b)(1) of the Act, 21 U.S.C. section 360bbb-3(b)(1), unless the authorization is terminated or revoked sooner. Performed at [facility], [address], [place], [place] [address]       Radiology Studies: Ct Angio Chest Pe W Or Wo Contrast  Result Date:  05/09/2019 CLINICAL DATA:  Shortness of breath and chest pain EXAM: CT ANGIOGRAPHY CHEST WITH CONTRAST TECHNIQUE: Multidetector CT imaging of the chest was performed using the standard protocol during bolus administration of intravenous contrast. Multiplanar CT image reconstructions and MIPs were obtained to evaluate the vascular anatomy. CONTRAST:  80 mL OMNIPAQUE IOHEXOL 350 MG/ML SOLN COMPARISON:  Chest radiograph May 07, 2019; CT angiogram chest May 24, 2012 FINDINGS: Cardiovascular: There is no demonstrable pulmonary embolus. There is no thoracic aortic aneurysm. No dissection evident. It should be noted that the contrast bolus within the aorta is not sufficient for confident dissection exclusion. Visualized great vessels show scattered foci of calcification. Note that the right innominate and left common carotid arteries arise as a common trunk, an anatomic variant. There is aortic atherosclerosis as well as foci of coronary artery calcification. Pacemaker leads are attached to the right heart and coronary sinus. Heart is mildly enlarged. Mediastinum/Nodes: Thyroid appears unremarkable. There are scattered subcentimeter mediastinal lymph nodes. There is a lymph node in the aortopulmonary window region measuring 1.4 x 1.5 cm. There is a lymph node to the right of the trachea near the carina measuring 1.9 x 1.7 cm. There is a lymph node in the right paratracheal region measuring 1.8 x 1.7 cm. There is a subcarinal lymph node measuring 1.9 x 1.9 cm. No esophageal lesions are evident. Lungs/Pleura: There are small pleural effusions bilaterally. There is compressive atelectasis in the left lower lobe period there are areas of mosaic attenuation throughout the lungs bilaterally. There is a degree of interstitial pulmonary edema in the lower lobe regions. Upper Abdomen: There is reflux of contrast into the inferior vena cava and hepatic veins. Visualized upper abdominal structures otherwise appear unremarkable.  Musculoskeletal: There are no blastic or lytic bone lesions. Pacemaker device present in the left upper thorax anteriorly. Review of the MIP images confirms the above findings. IMPRESSION: 1. No demonstrable pulmonary embolus. No thoracic aortic aneurysm. No dissection evident; it must be noted that the contrast bolus in the aorta is insufficient to exclude dissection confidently. There is aortic atherosclerosis as well as foci of great vessel and coronary artery calcification. 2. Small pleural effusions with a degree of interstitial edema. Suspect a degree of underlying congestive heart failure. There is compressive atelectasis in the left base. No frank airspace consolidation noted. 3. Mosaic attenuation in the lungs may be indicative of a degree of small airways obstructive disease. 4.  Several enlarged lymph nodes of uncertain etiology. 5. Reflux of contrast into the inferior vena cava and hepatic veins, a finding  suggesting increase in right heart pressure. 6.  Pacemaker present. Aortic Atherosclerosis (ICD10-I70.0). Electronically Signed   By: Bretta BangWilliam  Woodruff III M.D.   On: 05/09/2019 12:26    Scheduled Meds:  amLODipine  10 mg Oral Daily   aspirin EC  81 mg Oral q morning - 10a   atorvastatin  20 mg Oral QHS   budesonide  0.5 mg Nebulization BID   carvedilol  25 mg Oral BID WC   doxycycline  100 mg Oral Q12H   enoxaparin (LOVENOX) injection  40 mg Subcutaneous Q24H   feeding supplement (ENSURE ENLIVE)  237 mL Oral BID BM   insulin aspart  0-15 Units Subcutaneous TID WC   insulin aspart  0-5 Units Subcutaneous QHS   insulin glargine  20 Units Subcutaneous Daily   ipratropium-albuterol  3 mL Nebulization BID   losartan  50 mg Oral QHS   methylPREDNISolone (SOLU-MEDROL) injection  60 mg Intravenous Q12H   multivitamin with minerals  1 tablet Oral Q1500   pantoprazole  40 mg Oral q morning - 10a   tamsulosin  0.4 mg Oral q morning - 10a   Continuous Infusions:   LOS: 2  days   Assessment & Plan:   Principal Problem:   COPD (chronic obstructive pulmonary disease) (HCC) Active Problems:   DM II (diabetes mellitus, type II), controlled (HCC)   Essential hypertension   Permanent atrial fibrillation   Chronic diastolic heart failure (HCC)   Acute on chronic respiratory failure with hypoxia (HCC)   Acute on chronic hypoxic respiratory failure secondary to acute COPD exacerbation and acute on chronic diastolic congestive heart failure: CT angiogram of the chest ruled out of PE however this shows possibility of pulmonary edema/congestive heart failure.  Started on Lasix yesterday.  Had significant diuresis.  Feels much better.  We will give him another dose of IV Lasix 80 mg x 1 and continue current dose of Solu-Medrol and doxycycline.  Hoping that he will be back to his baseline tomorrow and will be ready for discharge.   Right lower lobe infiltrate/?community-acquired vs aspiration pneumonia: Chest x-ray shows possibility of right lower lobe infiltrate however CT chest did not mention anything like that and procalcitonin also unremarkable.  Does not have any fever or leukocytosis.  Continue doxycycline.   Type 2 diabetes mellitus: Takes Januvia at home.  Blood sugar still slightly elevated despite of 20 units of Lantus.  Will increase that to 25.  Continue SSI.    Essential hypertension: Blood pressure control.  Continue home medications.  Hyperlipidemia: Continue atorvastatin.  Chronic intermittent chest pain: EKG with no acute ST-T wave changes.  Troponin x2-.  CT chest ruled out PE.  DVT prophylaxis: Lovenox  Code Status: Full code  Family Communication: None present/requsted.  Disposition Plan: Home with home health likely tomorrow.  Time spent: 28 minutes.  Hughie Clossavi Yarianna Varble, MD [facility] Pager [phone]  If 7PM-7AM, please contact night-coverage [email or web address] Password [email or web address] 05/10/2019, 9:01 AM

## 2019-05-11 LAB — CBC WITH DIFFERENTIAL/PLATELET
Abs Immature Granulocytes: 0.08 10*3/uL — ABNORMAL HIGH (ref 0.00–0.07)
Basophils Absolute: 0 10*3/uL (ref 0.0–0.1)
Basophils Relative: 0 %
Eosinophils Absolute: 0 10*3/uL (ref 0.0–0.5)
Eosinophils Relative: 0 %
HCT: 38 % — ABNORMAL LOW (ref 39.0–52.0)
Hemoglobin: 12.5 g/dL — ABNORMAL LOW (ref 13.0–17.0)
Immature Granulocytes: 1 %
Lymphocytes Relative: 6 %
Lymphs Abs: 0.4 10*3/uL — ABNORMAL LOW (ref 0.7–4.0)
MCH: 29.8 pg (ref 26.0–34.0)
MCHC: 32.9 g/dL (ref 30.0–36.0)
MCV: 90.5 fL (ref 80.0–100.0)
Monocytes Absolute: 0.4 10*3/uL (ref 0.1–1.0)
Monocytes Relative: 5 %
Neutro Abs: 6.1 10*3/uL (ref 1.7–7.7)
Neutrophils Relative %: 88 %
Platelets: 203 10*3/uL (ref 150–400)
RBC: 4.2 MIL/uL — ABNORMAL LOW (ref 4.22–5.81)
RDW: 12.8 % (ref 11.5–15.5)
WBC: 7 10*3/uL (ref 4.0–10.5)
nRBC: 0 % (ref 0.0–0.2)

## 2019-05-11 LAB — BASIC METABOLIC PANEL
Anion gap: 9 (ref 5–15)
BUN: 21 mg/dL (ref 8–23)
CO2: 34 mmol/L — ABNORMAL HIGH (ref 22–32)
Calcium: 8.5 mg/dL — ABNORMAL LOW (ref 8.9–10.3)
Chloride: 95 mmol/L — ABNORMAL LOW (ref 98–111)
Creatinine, Ser: 0.89 mg/dL (ref 0.61–1.24)
GFR calc Af Amer: >60 mL/min (ref >60)
GFR calc non Af Amer: >60 mL/min (ref >60)
Glucose, Bld: 325 mg/dL — ABNORMAL HIGH (ref 70–99)
Potassium: 4.4 mmol/L (ref 3.5–5.1)
Sodium: 138 mmol/L (ref 135–145)

## 2019-05-11 LAB — GLUCOSE, CAPILLARY
Glucose-Capillary: 318 mg/dL — ABNORMAL HIGH (ref 70–99)
Glucose-Capillary: 275 mg/dL — ABNORMAL HIGH (ref 70–99)

## 2019-05-11 MED ORDER — FUROSEMIDE 10 MG/ML IJ SOLN
40.0000 mg | Freq: Once | INTRAMUSCULAR | Status: AC
  Administered 2019-05-11: 40 mg via INTRAVENOUS
  Filled 2019-05-11: qty 4

## 2019-05-11 NOTE — Discharge Instructions (Signed)
Chronic Obstructive Pulmonary Disease Exacerbation Chronic obstructive pulmonary disease (COPD) is a long-term (chronic) lung problem. In COPD, the flow of air from the lungs is limited. COPD exacerbations are times that breathing gets worse and you need more than your normal treatment. Without treatment, they can be life threatening. If they happen often, your lungs can become more damaged. If your COPD gets worse, your doctor may treat you with:  Medicines.  Oxygen.  Different ways to clear your airway, such as using a mask. Follow these instructions at home: Medicines  Take over-the-counter and prescription medicines only as told by your doctor.  If you take an antibiotic or steroid medicine, do not stop taking the medicine even if you start to feel better.  Keep up with shots (vaccinations) as told by your doctor. Be sure to get a yearly (annual) flu shot. Lifestyle  Do not smoke. If you need help quitting, ask your doctor.  Eat healthy foods.  Exercise regularly.  Get plenty of sleep.  Avoid tobacco smoke and other things that can bother your lungs.  Wash your hands often with soap and water. This will help keep you from getting an infection. If you cannot use soap and water, use hand sanitizer.  During flu season, avoid areas that are crowded with people. General instructions  Drink enough fluid to keep your pee (urine) clear or pale yellow. Do not do this if your doctor has told you not to.  Use a cool mist machine (vaporizer).  If you use oxygen or a machine that turns medicine into a mist (nebulizer), continue to use it as told.  Follow all instructions for rehabilitation. These are steps you can take to make your body work better.  Keep all follow-up visits as told by your doctor. This is important. Contact a doctor if:  Your COPD symptoms get worse than normal. Get help right away if:  You are short of breath and it gets worse.  You have trouble talking.   You have chest pain.  You cough up blood.  You have a fever.  You keep throwing up (vomiting).  You feel weak or you pass out (faint).  You feel confused.  You are not able to sleep because of your symptoms.  You are not able to do daily activities. Summary  COPD exacerbations are times that breathing gets worse and you need more treatment than normal.  COPD exacerbations can be very serious and may cause your lungs to become more damaged.  Do not smoke. If you need help quitting, ask your doctor.  Stay up-to-date on your shots. Get a flu shot every year. This information is not intended to replace advice given to you by your health care provider. Make sure you discuss any questions you have with your health care provider. Document Released: 09/16/2011 Document Revised: 09/09/2017 Document Reviewed: 11/01/2016 Elsevier Patient Education  2020 Elsevier Inc.  

## 2019-05-11 NOTE — Progress Notes (Signed)
Occupational Therapy Treatment Patient Details Name: Bradley MCNAB Sr. MRN: [id number] DOB: Jun 26, 1941 Today's Date: 05/11/2019    History of present illness 78 year old male admitted from home with increased difficulty breathing. PMH to include O2 dependent COPD, CHF, DM, pacemaker, Afib.   OT comments  Pt progressing towards OT goals. Still struggling a bit with O2 sats during activity. On 4L throughout session with O2 in upper 80s, asymptomatic. Continued energy conservation education. Pt very familiar with monitoring O2 sats at home. D/c plan remains appropriate.   Follow Up Recommendations  No OT follow up;Supervision/Assistance - 24 hour    Equipment Recommendations  None recommended by OT    Recommendations for Other Services      Precautions / Restrictions Precautions Precautions: None Precaution Comments: watch O2 sats Restrictions Weight Bearing Restrictions: No       Mobility Bed Mobility Overal bed mobility: Needs Assistance Bed Mobility: Supine to Sit;Sit to Supine     Supine to sit: Supervision;HOB elevated Sit to supine: Supervision;HOB elevated      Transfers Overall transfer level: Needs assistance Equipment used: None Transfers: Sit to/from Stand Sit to Stand: Supervision         General transfer comment: for lines, safety and balance    Balance Overall balance assessment: No apparent balance deficits (not formally assessed)                                         ADL either performed or assessed with clinical judgement   ADL Overall ADL's : Needs assistance/impaired     Grooming: Wash/dry hands;Supervision/safety;Standing                   Toilet Transfer: Supervision/safety;Min guard;Ambulation Toilet Transfer Details (indicate cue type and reason): simulated via transfer to/from EOB         Functional mobility during ADLs: Supervision/safety;Min guard General ADL Comments: decreased activity tolerance  impacting ADLs. Continued energy conservation education.     Vision       Perception     Praxis      Cognition Arousal/Alertness: Awake/alert Behavior During Therapy: WFL for tasks assessed/performed Overall Cognitive Status: Within Functional Limits for tasks assessed                                          Exercises     Shoulder Instructions       General Comments pt is mainly struggling with O2 sats but not with balance    Pertinent Vitals/ Pain       Pain Assessment: No/denies pain  Home Living                                          Prior Functioning/Environment              Frequency  Min 2X/week        Progress Toward Goals  OT Goals(current goals can now be found in the care plan section)  Progress towards OT goals: Progressing toward goals  Acute Rehab OT Goals Patient Stated Goal: home OT Goal Formulation: With patient Time For Goal Achievement: 05/22/19 Potential to Achieve Goals: Good ADL Goals Pt Will  Perform Grooming: with modified independence;standing Pt Will Perform Lower Body Dressing: with modified independence;sit to/from stand Pt Will Transfer to Toilet: with modified independence;ambulating Pt Will Perform Toileting - Clothing Manipulation and hygiene: with modified independence;sit to/from stand Additional ADL Goal #1: Pt will verbalize/demonstrate at least 3 energy conservation techniques during functional task completion.  Plan Discharge plan remains appropriate    Co-evaluation                 AM-PAC OT "6 Clicks" Daily Activity     Outcome Measure   Help from another person eating meals?: None Help from another person taking care of personal grooming?: None Help from another person toileting, which includes using toliet, bedpan, or urinal?: None Help from another person bathing (including washing, rinsing, drying)?: None Help from another person to put on and taking off  regular upper body clothing?: None Help from another person to put on and taking off regular lower body clothing?: A Little 6 Click Score: 23    End of Session Equipment Utilized During Treatment: Oxygen(4L)  OT Visit Diagnosis: Muscle weakness (generalized) (M62.81)   Activity Tolerance Patient tolerated treatment well;Other (comment)(O2 sats upper 80s throughout session)   Patient Left in bed;with call bell/phone within reach;with nursing/sitter in room(sitting EOB with nurse present)   Nurse Communication          Time: 9935-7017 OT Time Calculation (min): 15 min  Charges: OT General Charges $OT Visit: 1 Visit OT Treatments $Self Care/Home Management : 8-22 mins  Raynald Kemp, OT Acute Rehabilitation Services Pager: [phone] Office: [phone]    Pilar Grammes 05/11/2019, 12:10 PM

## 2019-05-11 NOTE — Care Management Important Message (Signed)
Important Message  Patient Details  Name: Bradley ALDEA Sr. MRN: [id number] Date of Birth: 1941/05/26   Medicare Important Message Given:  Yes     [name] 05/11/2019, 12:44 PM

## 2019-05-11 NOTE — Discharge Summary (Signed)
Physician Discharge Summary  BRAIDEN RODMAN Sr. ZOX:[id number] DOB: 09-12-1941 DOA: 05/07/2019  PCP: [name], MD  Admit date: 05/07/2019 Discharge date: 05/11/2019  Admitted From: Home Disposition: Home with home health  Recommendations for Outpatient Follow-up:  1. Follow up with PCP in 1-2 weeks 2. Please obtain BMP/CBC in one week 3. Please follow up on the following pending results:  Home Health: Yes Equipment/Devices: Rolling walker  Discharge Condition: Stable CODE STATUS: Full code Diet recommendation: Cardiac  Subjective: Seen and examined.  Feels much better.  Breathing back to baseline.  Ready to go home.  Brief/Interim Summary: Dierdre Searles Sr.is a 78 y.o.malewith medical history significant ofDM; pulmonary HTN; PVD; COPD on home O2, 2-3L; HTN; HLD; CHF; CAD; and afib presented to ED on 05/07/2019 with SOB of about 3 to 4 weeks.He was here once before for this same problem.  He also complains of occasional, intermittent chest pain sometimes on the left are right side which resolves on its own. No cough. No orthopnea. No fevers. No COVID contacts.  He was diagnosed with COPD exacerbation in the ED and required BiPAP.  He received Solu-Medrol in the ED.  He was admitted under hospitalist service and was also started on doxycycline.  EKG did not show any acute ST-T wave changes.  Cardiac enzymes were negative.  He continued to complain of chest pain raising suspicion for PE so CT angiogram of the chest was obtained and he was pulled out of PE however he was having some pulmonary edema and was then found to have acute on chronic diastolic congestive heart failure as well.  He also had +2 pitting edema bilateral lower extremity.  He was given high dose of daily Lasix almost 80 mg IV and had significant diuresis and continue to improve and his edema has resolved.  He was seen by PT OT and they recommended home health which has been arranged for him along with rolling walker.   Today he feels much better.  On lung exam, he did not have any crackles or even wheezes.  He has received 5 days of Solu-Medrol and doxycycline.  He is back to 4 L of nasal oxygen which is his baseline.  He is comfortable going home.  He does not need any further steroids or doxycycline at this point in time and will resume his home dose of Lasix.  Follow with PCP within a week.  Discharge Diagnoses:  Principal Problem:   COPD (chronic obstructive pulmonary disease) (HCC) Active Problems:   DM II (diabetes mellitus, type II), controlled (HCC)   Essential hypertension   Permanent atrial fibrillation   Acute on chronic diastolic (congestive) heart failure (HCC)   Acute on chronic respiratory failure with hypoxia (HCC)   COPD with acute exacerbation [facility])    Discharge Instructions  Discharge Instructions    Discharge patient   Complete by: As directed    Discharge disposition: 06-Home-Health Care Svc   Discharge patient date: 05/11/2019     Allergies as of 05/11/2019      Reactions   Hydrocodone Other (See Comments)   "given to him in the hospital; went thru withdrawals once home; dr said not to take it again" (09/27/2012)   Tramadol Other (See Comments)   "given to him in the hospital; went thru withdrawals once home; dr said not to take it again" (09/27/2012)   Tadalafil Other (See Comments)   headache, backache   Avelox [moxifloxacin Hydrochloride] Itching, Other (See Comments)  Headache      Medication List    TAKE these medications   acetaminophen 500 MG tablet Commonly known as: TYLENOL Take 500 mg by mouth every 6 (six) hours as needed for headache.   amLODipine 10 MG tablet Commonly known as: NORVASC Take 1 tablet (10 mg total) by mouth daily.   aspirin 81 MG tablet Take 81 mg by mouth every morning.   atorvastatin 20 MG tablet Commonly known as: LIPITOR Take 1 tablet (20 mg total) by mouth at bedtime.   budesonide 0.5 MG/2ML nebulizer solution Commonly  known as: PULMICORT Take 2 mLs (0.5 mg total) by nebulization 2 (two) times daily for 30 days.   calcium-vitamin D 500-200 MG-UNIT tablet Commonly known as: OSCAL WITH D Take 1 tablet by mouth every morning.   carvedilol 25 MG tablet Commonly known as: COREG Take 1 tablet (25 mg total) by mouth 2 (two) times daily with a meal.   furosemide 40 MG tablet Commonly known as: LASIX Take 1 tablet (40 mg total) by mouth daily.   ipratropium-albuterol 0.5-2.5 (3) MG/3ML Soln Commonly known as: DUONEB Take 3 mLs by nebulization 2 (two) times daily as needed.   losartan 100 MG tablet Commonly known as: COZAAR Take 0.5 tablets (50 mg total) by mouth at bedtime.   multivitamins ther. w/minerals Tabs tablet Take 1 tablet by mouth daily.   nitroGLYCERIN 0.4 MG SL tablet Commonly known as: NITROSTAT Place 1 tablet (0.4 mg total) under the tongue every 5 (five) minutes x 3 doses as needed for chest pain.   onetouch ultrasoft lancets Check blood sugars no more than twice daily   OneTouch Verio test strip Generic drug: glucose blood USE TO CHECK BLOOD SUGAR NO MORE THAN TWICE A DAY   pantoprazole 40 MG tablet Commonly known as: PROTONIX Take 1 tablet (40 mg total) by mouth every morning.   sitaGLIPtin 100 MG tablet Commonly known as: Januvia Take 1 tablet (100 mg total) by mouth daily. What changed: when to take this   tamsulosin 0.4 MG Caps capsule Commonly known as: FLOMAX Take 1 capsule (0.4 mg total) by mouth daily. What changed: when to take this   zolpidem 10 MG tablet Commonly known as: AMBIEN Take 0.5 tablets (5 mg total) by mouth at bedtime as needed for sleep. What changed: how much to take      Follow-up Information    [facility] Follow [facility].   Specialty: Home Health Services Why: Registered Nurse and Physical Therapy- office to call with a visit time. If you have additional questions please call [phone]       Wanda PlumpPaz, Jose E, MD Follow  up in 1 week(s).   Specialty: Internal Medicine Contact information: [address] [place] [address] [phone]        Chilton Siandolph, Tiffany, MD .   Specialty: Cardiology Contact information: [address] [place] [address] [phone]          Allergies  Allergen Reactions  . Hydrocodone Other (See Comments)    "given to him in the hospital; went thru withdrawals once home; dr said not to take it again" (09/27/2012)  . Tramadol Other (See Comments)    "given to him in the hospital; went thru withdrawals once home; dr said not to take it again" (09/27/2012)  . Tadalafil Other (See Comments)    headache, backache  . Avelox [Moxifloxacin Hydrochloride] Itching and Other (See Comments)    Headache  Consultations: None   Procedures/Studies: Ct Angio Chest Pe W Or Wo Contrast  Result Date: 05/09/2019 CLINICAL DATA:  Shortness of breath and chest pain EXAM: CT ANGIOGRAPHY CHEST WITH CONTRAST TECHNIQUE: Multidetector CT imaging of the chest was performed using the standard protocol during bolus administration of intravenous contrast. Multiplanar CT image reconstructions and MIPs were obtained to evaluate the vascular anatomy. CONTRAST:  80 mL OMNIPAQUE IOHEXOL 350 MG/ML SOLN COMPARISON:  Chest radiograph May 07, 2019; CT angiogram chest May 24, 2012 FINDINGS: Cardiovascular: There is no demonstrable pulmonary embolus. There is no thoracic aortic aneurysm. No dissection evident. It should be noted that the contrast bolus within the aorta is not sufficient for confident dissection exclusion. Visualized great vessels show scattered foci of calcification. Note that the right innominate and left common carotid arteries arise as a common trunk, an anatomic variant. There is aortic atherosclerosis as well as foci of coronary artery calcification. Pacemaker leads are attached to the right heart and coronary sinus. Heart is mildly enlarged.  Mediastinum/Nodes: Thyroid appears unremarkable. There are scattered subcentimeter mediastinal lymph nodes. There is a lymph node in the aortopulmonary window region measuring 1.4 x 1.5 cm. There is a lymph node to the right of the trachea near the carina measuring 1.9 x 1.7 cm. There is a lymph node in the right paratracheal region measuring 1.8 x 1.7 cm. There is a subcarinal lymph node measuring 1.9 x 1.9 cm. No esophageal lesions are evident. Lungs/Pleura: There are small pleural effusions bilaterally. There is compressive atelectasis in the left lower lobe period there are areas of mosaic attenuation throughout the lungs bilaterally. There is a degree of interstitial pulmonary edema in the lower lobe regions. Upper Abdomen: There is reflux of contrast into the inferior vena cava and hepatic veins. Visualized upper abdominal structures otherwise appear unremarkable. Musculoskeletal: There are no blastic or lytic bone lesions. Pacemaker device present in the left upper thorax anteriorly. Review of the MIP images confirms the above findings. IMPRESSION: 1. No demonstrable pulmonary embolus. No thoracic aortic aneurysm. No dissection evident; it must be noted that the contrast bolus in the aorta is insufficient to exclude dissection confidently. There is aortic atherosclerosis as well as foci of great vessel and coronary artery calcification. 2. Small pleural effusions with a degree of interstitial edema. Suspect a degree of underlying congestive heart failure. There is compressive atelectasis in the left base. No frank airspace consolidation noted. 3. Mosaic attenuation in the lungs may be indicative of a degree of small airways obstructive disease. 4.  Several enlarged lymph nodes of uncertain etiology. 5. Reflux of contrast into the inferior vena cava and hepatic veins, a finding suggesting increase in right heart pressure. 6.  Pacemaker present. Aortic Atherosclerosis (ICD10-I70.0). Electronically Signed   By:  Bretta Bang III M.D.   On: 05/09/2019 12:26   US Abdomen Complete  Result Date: 04/20/2019 CLINICAL DATA:  Abdominal pain. EXAM: ABDOMEN ULTRASOUND COMPLETE COMPARISON:  None. FINDINGS: Gallbladder: No gallstones or wall thickening visualized. No sonographic Murphy sign noted by sonographer. Common bile duct: Diameter: 2.8 mm Liver: No focal lesion identified. Within normal limits in parenchymal echogenicity. Portal vein is patent on color Doppler imaging with normal direction of blood flow towards the liver. IVC: No abnormality visualized. Pancreas: Visualized portion unremarkable. Spleen: Size and appearance within normal limits. Right Kidney: Length: 11.9 cm. Echogenicity within normal limits. No mass or hydronephrosis visualized. Left Kidney: Length: 11.9 cm. Echogenicity within normal limits. Several cysts are identified in the left  kidney, largest measures 2.6 x 2.5 x 2.2 cm. Abdominal aorta: Proximal aorta measures 3.4 cm mildly ectatic. Atherosclerosis is noted in the abdominal aorta. Other findings: None. IMPRESSION: Normal gallbladder. No acute abnormality Left kidney cyst. Electronically Signed   By: Sherian ReinWei-Chen  Lin M.D.   On: 04/20/2019 14:11   Dg Chest Portable 1 View  Result Date: 05/07/2019 CLINICAL DATA:  Shortness of breath EXAM: PORTABLE CHEST 1 VIEW COMPARISON:  12/21/2018 FINDINGS: Left-sided implanted cardiac device remains in place. Stable cardiomediastinal contours. Calcific aortic knob. Patchy bibasilar airspace opacities, right worse than left. Probable small right pleural effusion. No pneumothorax. IMPRESSION: Increasing right basilar airspace opacity with probable small right pleural effusion. Electronically Signed   By: Duanne GuessNicholas  Plundo M.D.   On: 05/07/2019 11:18     Discharge Exam: Vitals:   05/11/19 0851 05/11/19 0945  BP:  (!) 136/96  Pulse:    Resp:    Temp:    SpO2: 100%    Vitals:   05/10/19 2122 05/11/19 0614 05/11/19 0851 05/11/19 0945  BP: 134/84  133/83  (!) 136/96  Pulse: 75 85    Resp:      Temp: 97.8 F (36.6 C) 97.7 F (36.5 C)    TempSrc: Oral Oral    SpO2: 92% 91% 100%   Weight:  95 kg    Height:        General: Pt is alert, awake, not in acute distress, morbidly obese Cardiovascular: RRR, S1/S2 +, no rubs, no gallops Respiratory: CTA bilaterally, no wheezing, no rhonchi Abdominal: Soft, NT, ND, bowel sounds + Extremities: no edema, no cyanosis    The results of significant diagnostics from this hospitalization (including imaging, microbiology, ancillary and laboratory) are listed below for reference.     Microbiology: Recent Results (from the past 240 hour(s))  SARS Coronavirus 2 (CEPHEID - Performed in [facility]), Hosp Order     Status: None   Collection Time: 05/07/19 11:19 AM   Specimen: Nasopharyngeal Swab  Result Value Ref Range Status   SARS Coronavirus 2 NEGATIVE NEGATIVE Final    Comment: (NOTE) If result is NEGATIVE SARS-CoV-2 target nucleic acids are NOT DETECTED. The SARS-CoV-2 RNA is generally detectable in upper and lower  respiratory specimens during the acute phase of infection. The lowest  concentration of SARS-CoV-2 viral copies this assay can detect is 250  copies / mL. A negative result does not preclude SARS-CoV-2 infection  and should not be used as the sole basis for treatment or other  patient management decisions.  A negative result may occur with  improper specimen collection / handling, submission of specimen other  than nasopharyngeal swab, presence of viral mutation(s) within the  areas targeted by this assay, and inadequate number of viral copies  (<250 copies / mL). A negative result must be combined with clinical  observations, patient history, and epidemiological information. If result is POSITIVE SARS-CoV-2 target nucleic acids are DETECTED. The SARS-CoV-2 RNA is generally detectable in upper and lower  respiratory specimens dur ing the acute phase of  infection.  Positive  results are indicative of active infection with SARS-CoV-2.  Clinical  correlation with patient history and other diagnostic information is  necessary to determine patient infection status.  Positive results do  not rule out bacterial infection or co-infection with other viruses. If result is PRESUMPTIVE POSTIVE SARS-CoV-2 nucleic acids MAY BE PRESENT.   A presumptive positive result was obtained on the submitted specimen  and confirmed on repeat testing.  While 2019 novel coronavirus  (SARS-CoV-2) nucleic acids may be present in the submitted sample  additional confirmatory testing may be necessary for epidemiological  and / or clinical management purposes  to differentiate between  SARS-CoV-2 and other Sarbecovirus currently known to infect humans.  If clinically indicated additional testing with an alternate test  methodology [phone]) is advised. The SARS-CoV-2 RNA is generally  detectable in upper and lower respiratory sp ecimens during the acute  phase of infection. The expected result is Negative. Fact Sheet for Patients:  StrictlyIdeas.no Fact Sheet for Healthcare Providers: BankingDealers.co.za This test is not yet approved or cleared by the [place] FDA and has been authorized for detection and/or diagnosis of SARS-CoV-2 by FDA under an Emergency Use Authorization (EUA).  This EUA will remain in effect (meaning this test can be used) for the duration of the COVID-19 declaration under Section 564(b)(1) of the Act, 21 U.S.C. section 360bbb-3(b)(1), unless the authorization is terminated or revoked sooner. Performed at [facility], [place] [address], [place], [place] [address]      Labs: BNP (last 3 results) Recent Labs    05/25/18 0842 09/19/18 1219 12/21/18 2030  BNP 307.9* 324.5* 814.4*   Basic Metabolic Panel: Recent Labs  Lab 05/07/19 1038 05/08/19 0524 05/09/19 0434 05/10/19 0350   NA 140 135 138 139  K 4.2 4.6 5.1 4.5  CL 102 100 103 99  CO2 29 27 27  32  GLUCOSE 130* 241* 187* 188*  BUN 8 10 14 19   CREATININE 0.83 0.74 0.78 0.80  CALCIUM 8.4* 8.8* 8.9 8.9  MG  --   --  2.1  --    Liver Function Tests: No results for input(s): AST, ALT, ALKPHOS, BILITOT, PROT, ALBUMIN in the last 168 hours. No results for input(s): LIPASE, AMYLASE in the last 168 hours. No results for input(s): AMMONIA in the last 168 hours. CBC: Recent Labs  Lab 05/07/19 1038 05/08/19 0524 05/09/19 0434 05/10/19 0350 05/11/19 0419  WBC 6.7 3.5* 8.0 6.7 7.0  NEUTROABS  --   --  7.1 5.8 6.1  HGB 12.0* 12.9* 12.1* 12.2* 12.5*  HCT 37.7* 38.9* 36.4* 36.9* 38.0*  MCV 94.5 90.5 91.0 90.7 90.5  PLT 218 214 199 190 203   Cardiac Enzymes: No results for input(s): CKTOTAL, CKMB, CKMBINDEX, TROPONINI in the last 168 hours. BNP: Invalid input(s): POCBNP CBG: Recent Labs  Lab 05/10/19 0803 05/10/19 1135 05/10/19 1703 05/10/19 2138 05/11/19 0826  GLUCAP 249* 251* 327* 240* 318*   D-Dimer No results for input(s): DDIMER in the last 72 hours. Hgb A1c No results for input(s): HGBA1C in the last 72 hours. Lipid Profile No results for input(s): CHOL, HDL, LDLCALC, TRIG, CHOLHDL, LDLDIRECT in the last 72 hours. Thyroid function studies No results for input(s): TSH, T4TOTAL, T3FREE, THYROIDAB in the last 72 hours.  Invalid input(s): FREET3 Anemia work up No results for input(s): VITAMINB12, FOLATE, FERRITIN, TIBC, IRON, RETICCTPCT in the last 72 hours. Urinalysis    Component Value Date/Time   COLORURINE YELLOW 07/15/2015 1440   APPEARANCEUR CLEAR 07/15/2015 1440   LABSPEC 1.015 07/15/2015 1440   PHURINE 6.0 07/15/2015 1440   GLUCOSEU NEGATIVE 07/15/2015 1440   HGBUR TRACE-LYSED (A) 07/15/2015 1440   BILIRUBINUR NEGATIVE 07/15/2015 1440   KETONESUR TRACE (A) 07/15/2015 1440   PROTEINUR NEGATIVE 07/06/2012 0341   UROBILINOGEN 1.0 07/15/2015 1440   NITRITE NEGATIVE 07/15/2015  1440   LEUKOCYTESUR NEGATIVE 07/15/2015 1440   Sepsis Labs Invalid input(s): PROCALCITONIN,  WBC,  LACTICIDVEN Microbiology Recent Results (from the past 240 hour(s))  SARS Coronavirus 2 (CEPHEID - Performed in [facility] hospital lab), Hosp Order     Status: None   Collection Time: 05/07/19 11:19 AM   Specimen: Nasopharyngeal Swab  Result Value Ref Range Status   SARS Coronavirus 2 NEGATIVE NEGATIVE Final    Comment: (NOTE) If result is NEGATIVE SARS-CoV-2 target nucleic acids are NOT DETECTED. The SARS-CoV-2 RNA is generally detectable in upper and lower  respiratory specimens during the acute phase of infection. The lowest  concentration of SARS-CoV-2 viral copies this assay can detect is 250  copies / mL. A negative result does not preclude SARS-CoV-2 infection  and should not be used as the sole basis for treatment or other  patient management decisions.  A negative result may occur with  improper specimen collection / handling, submission of specimen other  than nasopharyngeal swab, presence of viral mutation(s) within the  areas targeted by this assay, and inadequate number of viral copies  (<250 copies / mL). A negative result must be combined with clinical  observations, patient history, and epidemiological information. If result is POSITIVE SARS-CoV-2 target nucleic acids are DETECTED. The SARS-CoV-2 RNA is generally detectable in upper and lower  respiratory specimens dur ing the acute phase of infection.  Positive  results are indicative of active infection with SARS-CoV-2.  Clinical  correlation with patient history and other diagnostic information is  necessary to determine patient infection status.  Positive results do  not rule out bacterial infection or co-infection with other viruses. If result is PRESUMPTIVE POSTIVE SARS-CoV-2 nucleic acids MAY BE PRESENT.   A presumptive positive result was obtained on the submitted specimen  and confirmed on repeat testing.   While 2019 novel coronavirus  (SARS-CoV-2) nucleic acids may be present in the submitted sample  additional confirmatory testing may be necessary for epidemiological  and / or clinical management purposes  to differentiate between  SARS-CoV-2 and other Sarbecovirus currently known to infect humans.  If clinically indicated additional testing with an alternate test  methodology [phone]([id number]) is advised. The SARS-CoV-2 RNA is generally  detectable in upper and lower respiratory sp ecimens during the acute  phase of infection. The expected result is Negative. Fact Sheet for Patients:  BoilerBrush.com.cyhttps://www.fda.gov/media/136312/download Fact Sheet for Healthcare Providers: https://pope.com/https://www.fda.gov/media/136313/download This test is not yet approved or cleared by the [place] FDA and has been authorized for detection and/or diagnosis of SARS-CoV-2 by FDA under an Emergency Use Authorization (EUA).  This EUA will remain in effect (meaning this test can be used) for the duration of the COVID-19 declaration under Section 564(b)(1) of the Act, 21 U.S.C. section 360bbb-3(b)(1), unless the authorization is terminated or revoked sooner. Performed at [facility], [address] [place], [place] [address]      Time coordinating discharge: Over 30 minutes  SIGNED:   Hughie Clossavi Verona Hartshorn, MD  [facility] 05/11/2019, 9:52 AM Pager [phone]  If 7PM-7AM, please contact night-coverage [email or web address] Password [email or web address]

## 2019-05-11 NOTE — Progress Notes (Signed)
Inpatient Diabetes Program Recommendations  AACE/ADA: New Consensus Statement on Inpatient Glycemic Control (2015)  Target Ranges:  Prepandial:   less than 140 mg/dL      Peak postprandial:   less than 180 mg/dL (1-2 hours)      Critically ill patients:  140 - 180 mg/dL   Lab Results  Component Value Date   GLUCAP 275 (H) 05/11/2019   HGBA1C 5.9 12/12/2018    Review of Glycemic Control  Blood sugars > goal of 180 mg/dL. Needs insulin adjustment.  Inpatient Diabetes Program Recommendations:     Increase Lantus to 28 units QD Add Novolog 3 units tidwc for meal coverage insulin.  Will continue to follow closely.  Thank you. Lorenda Peck, RD, LDN, CDE Inpatient Diabetes Coordinator [phone]

## 2019-05-14 ENCOUNTER — Telehealth: Payer: Self-pay | Admitting: *Deleted

## 2019-05-14 NOTE — Telephone Encounter (Signed)
[facility] requesting refill for zolpidem.

## 2019-05-14 NOTE — Telephone Encounter (Signed)
Called pt for TCM hospital follow up. Wife ([place]) answered phone. Okay to speak w/ her per DPR. States pt is doing great and out running errands with grandson. Reports no changes to pt's meds. PCP follow up scheduled 05/15/19.

## 2019-05-15 ENCOUNTER — Other Ambulatory Visit: Payer: Self-pay

## 2019-05-15 ENCOUNTER — Encounter: Payer: Self-pay | Admitting: Internal Medicine

## 2019-05-15 ENCOUNTER — Ambulatory Visit (INDEPENDENT_AMBULATORY_CARE_PROVIDER_SITE_OTHER): Payer: Medicare Other | Admitting: Internal Medicine

## 2019-05-15 VITALS — BP 133/74 | HR 100 | Temp 98.3°F | Resp 20 | Ht 69.0 in | Wt 213.2 lb

## 2019-05-15 DIAGNOSIS — G4700 Insomnia, unspecified: Secondary | ICD-10-CM

## 2019-05-15 DIAGNOSIS — I5033 Acute on chronic diastolic (congestive) heart failure: Secondary | ICD-10-CM

## 2019-05-15 LAB — CBC WITH DIFFERENTIAL/PLATELET
Basophils Absolute: 0 10*3/uL (ref 0.0–0.1)
Basophils Relative: 0.2 % (ref 0.0–3.0)
Eosinophils Absolute: 0.2 10*3/uL (ref 0.0–0.7)
Eosinophils Relative: 1.8 % (ref 0.0–5.0)
HCT: 40 % (ref 39.0–52.0)
Hemoglobin: 13.3 g/dL (ref 13.0–17.0)
Lymphocytes Relative: 16.6 % (ref 12.0–46.0)
Lymphs Abs: 1.5 10*3/uL (ref 0.7–4.0)
MCHC: 33.3 g/dL (ref 30.0–36.0)
MCV: 91.4 fl (ref 78.0–100.0)
Monocytes Absolute: 0.7 10*3/uL (ref 0.1–1.0)
Monocytes Relative: 7.2 % (ref 3.0–12.0)
Neutro Abs: 6.8 10*3/uL (ref 1.4–7.7)
Neutrophils Relative %: 74.2 % (ref 43.0–77.0)
Platelets: 220 10*3/uL (ref 150.0–400.0)
RBC: 4.38 Mil/uL (ref 4.22–5.81)
RDW: 14 % (ref 11.5–15.5)
WBC: 9.1 10*3/uL (ref 4.0–10.5)

## 2019-05-15 LAB — BASIC METABOLIC PANEL
BUN: 11 mg/dL (ref 6–23)
CO2: 37 mEq/L — ABNORMAL HIGH (ref 19–32)
Calcium: 8.9 mg/dL (ref 8.4–10.5)
Chloride: 99 mEq/L (ref 96–112)
Creatinine, Ser: 0.75 mg/dL (ref 0.40–1.50)
GFR: 100.65 mL/min (ref >60.00)
Glucose, Bld: 131 mg/dL — ABNORMAL HIGH (ref 70–99)
Potassium: 5.2 mEq/L — ABNORMAL HIGH (ref 3.5–5.1)
Sodium: 140 mEq/L (ref 135–145)

## 2019-05-15 MED ORDER — ZOLPIDEM TARTRATE 10 MG PO TABS: 5.0000 mg | ORAL_TABLET | Freq: Every evening | ORAL | 1 refills | Status: DC | PRN

## 2019-05-15 NOTE — Telephone Encounter (Signed)
Patient seen today, RF sent

## 2019-05-15 NOTE — Progress Notes (Signed)
Subjective:    Patient ID: Bradley [name]., male    DOB: Jan 10, 1941, 78 y.o.   MRN: [id number]  DOS:  05/15/2019 Type of visit - description: TCM 7 Admitted to the hospital and discharged 05/11/2019  Admitted to hospital with difficulty breathing for 3 to 4 weeks.  Also intermittent chest pain. EKG with no acute changes, cardiac enzymes negative. Initially he received antibiotics and IV steroids. Subsequently a CT chest was prescribed to rule out PE: Negative but showed some evidence of CHF. Consequently, he was given high doses of IV Lasix with significant diuresis. Was discharged home with PT OT, recommended a rolling walker. He was recommended to continue 4 L nasal oxygen. To continue home doses of Lasix  Wt Readings from Last 3 Encounters:  05/15/19 213 lb 4 oz (96.7 kg)  05/11/19 209 lb 8 oz (95 kg)  04/18/19 216 lb 8 oz (98.2 kg)     Review of Systems Since he left the hospital he is doing well. No fever chills No chest pain.  Shortness of breath at baseline No nausea, vomiting, diarrhea Mild cough with no sputum production.   Past Medical History:  Diagnosis Date  . Abnormal CT scan, chest 2012   CT chest, several lymphadenopathies. Sees pulmonary  . Anemia    intermittent  . Arthritis    "?back" (09/19/2018)  . Atrial fibrillation (HCC)    s/p AV node ablation & BiV PPM implantation 09/08/11 (op dictation pending)  . BPH (benign prostatic hyperplasia)    Saw Dr [name] 2004, normal renal u/s  . Bronchitis 08/26/2017  . CAD (coronary artery disease)   . CHF (congestive heart failure) (HCC)    Thought primarily to be non-systolic although EF down (EF 78-29% 12/2010, down to 35-40% 09/05/11), cath 2008 with no CAD, nuclear study 07/2011 showing Small area of reversibility in the distal ant/lat wall the left ventricle suspicious for ischemia/septal wall HK but felt to be low risk  (per D/C Summary 07/2011)  . Chronic bronchitis (HCC)    "get it ~ q yr"  . Heart  murmur   . HLD (hyperlipidemia)   . HTN (hypertension)   . 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 Insomnia   . Migraines    "very very rare"  . On home oxygen therapy    "2L; 24/7" (09/19/2018)  . Pacemaker   . Peripheral vascular disease (HCC)    ??  . Pleural effusion 2008   S/p decortication  . Pulmonary HTN (HCC)    per cath 2008  . Type II diabetes mellitus (HCC) 1999    Past Surgical History:  Procedure Laterality Date  . BI-VENTRICULAR PACEMAKER INSERTION Left 09/08/2011   Procedure: BI-VENTRICULAR PACEMAKER INSERTION (CRT-P);  Surgeon: Marinus Maw, MD;  Location: [facility] CATH LAB;  Service: Cardiovascular;  Laterality: Left;  . CARDIAC CATHETERIZATION     "couple times; never had balloon or stent" (09/19/2018)  . CATARACT EXTRACTION W/ INTRAOCULAR LENS  IMPLANT, BILATERAL Bilateral 08/2018  . COLONOSCOPY  03/10/11   normal  . INSERT / REPLACE / REMOVE PACEMAKER  09/08/11   pacemaker placement  . LUNG DECORTICATION    . PLEURAL SCARIFICATION    . pneumothorax with fibrothorax  ~ 2010  . TONSILLECTOMY     "as a kid"     Social History   Socioeconomic History  . Marital status: Married    Spouse name: [name]  . Number of children: 1  .  Years of education: Not on file  . Highest education level: Not on file  Occupational History  . Occupation: retired Publishing rights managertruck driver    Employer: RETIRED  Social Needs  . Financial resource strain: Not hard at all  . Food insecurity    Worry: Never true    Inability: Never true  . Transportation needs    Medical: No    Non-medical: No  Tobacco Use  . Smoking status: Former Smoker    Packs/day: 2.50    Years: 25.00    Pack years: 62.50    Types: Cigarettes, Cigars    Quit date: 10/11/1980    Years since quitting: 38.6  . Smokeless tobacco: Never Used  Substance and Sexual Activity  . Alcohol use: Not Currently    Comment: 1 can of beer ever 4-5 months  . Drug use: No  . Sexual  activity: Not Currently  Lifestyle  . Physical activity    Days per week: 6 days    Minutes per session: 20 min  . Stress: Not at all  Relationships  . Social connections    Talks on phone: More than three times a week    Gets together: More than three times a week    Attends religious service: More than 4 times per year    Active member of club or organization: No    Attends meetings of clubs or organizations: Never    Relationship status: Married  . Intimate partner violence    Fear of current or ex partner: Not on file    Emotionally abused: Not on file    Physically abused: Not on file    Forced sexual activity: Not on file  Other Topics Concern  . Not on file  Social History Narrative   Moved from [place] 2003.[place].   He lives w/ his wife, [name]   1 son, substance abuse, passed away 09/2016            Allergies as of 05/15/2019      Reactions   Hydrocodone Other (See Comments)   "given to him in the hospital; went thru withdrawals once home; dr said not to take it again" (09/27/2012)   Tramadol Other (See Comments)   "given to him in the hospital; went thru withdrawals once home; dr said not to take it again" (09/27/2012)   Tadalafil Other (See Comments)   headache, backache   Avelox [moxifloxacin Hydrochloride] Itching, Other (See Comments)   Headache      Medication List       Accurate as of May 15, 2019 11:28 AM. If you have any questions, ask your nurse or doctor.        acetaminophen 500 MG tablet Commonly known as: TYLENOL Take 500 mg by mouth every 6 (six) hours as needed for headache.   amLODipine 10 MG tablet Commonly known as: NORVASC Take 1 tablet (10 mg total) by mouth daily.   aspirin 81 MG tablet Take 81 mg by mouth every morning.   atorvastatin 20 MG tablet Commonly known as: LIPITOR Take 1 tablet (20 mg total) by mouth at bedtime.   budesonide 0.5 MG/2ML nebulizer solution Commonly known as: PULMICORT Take 2 mLs (0.5 mg total) by  nebulization 2 (two) times daily for 30 days.   calcium-vitamin D 500-200 MG-UNIT tablet Commonly known as: OSCAL WITH D Take 1 tablet by mouth every morning.   carvedilol 25 MG tablet Commonly known as: COREG Take 1 tablet (25 mg total) by mouth  2 (two) times daily with a meal.   furosemide 40 MG tablet Commonly known as: LASIX Take 1 tablet (40 mg total) by mouth daily.   ipratropium-albuterol 0.5-2.5 (3) MG/3ML Soln Commonly known as: DUONEB Take 3 mLs by nebulization 2 (two) times daily as needed.   losartan 100 MG tablet Commonly known as: COZAAR Take 0.5 tablets (50 mg total) by mouth at bedtime.   multivitamins ther. w/minerals Tabs tablet Take 1 tablet by mouth daily.   nitroGLYCERIN 0.4 MG SL tablet Commonly known as: NITROSTAT Place 1 tablet (0.4 mg total) under the tongue every 5 (five) minutes x 3 doses as needed for chest pain.   onetouch ultrasoft lancets Check blood sugars no more than twice daily   OneTouch Verio test strip Generic drug: glucose blood USE TO CHECK BLOOD SUGAR NO MORE THAN TWICE A DAY   pantoprazole 40 MG tablet Commonly known as: PROTONIX Take 1 tablet (40 mg total) by mouth every morning.   sitaGLIPtin 100 MG tablet Commonly known as: Januvia Take 1 tablet (100 mg total) by mouth daily. What changed: when to take this   tamsulosin 0.4 MG Caps capsule Commonly known as: FLOMAX Take 1 capsule (0.4 mg total) by mouth daily. What changed: when to take this   zolpidem 10 MG tablet Commonly known as: AMBIEN Take 0.5 tablets (5 mg total) by mouth at bedtime as needed for sleep. What changed: how much to take           Objective:   Physical Exam BP 133/74 (BP Location: Left Arm, Patient Position: Sitting, Cuff Size: Small)   Pulse 100   Temp 98.3 F (36.8 C) (Oral)   Resp 20   Ht 5\' 9"  (1.753 m)   Wt 213 lb 4 oz (96.7 kg)   SpO2 (!) 86% Comment: 3-4 w/ exertion  BMI 31.49 kg/m  General:   Well developed, NAD, BMI  noted.  HEENT:  Normocephalic . Face symmetric, atraumatic Neck: Minimal JVD at 45 degrees Lungs:  Decreased breath sounds otherwise clear Normal respiratory effort, no intercostal retractions, no accessory muscle use. Heart: RRR,  no murmur.  Trace pretibial edema bilaterally  Abdomen:  Not distended, soft, non-tender. No rebound or rigidity.   Skin: Not pale. Not jaundice Neurologic:  alert & oriented X3.  Speech normal, gait appropriate for age and unassisted Psych--  Cognition and judgment appear intact.  Cooperative with normal attention span and concentration.  Behavior appropriate. No anxious or depressed appearing.     Assessment      Assessment  DM HTN Hyperlipidemia GERD Anxiety- insomnia  BPH Cardiovascular:Dr Lovena Le  --  Cath x 2 [id number]: no significant CAD -- CHF, non-ischemic  --Atrial fibrillation; h/o AV node ablation, has a Pacemaker --h/o IC bleeding: Not anticoagulated --Peripheral vascular disease? Pulmonary: Dr Melvyn Novas, last OV 10-18-2014, f/u prn --COPD, severe, chronic resp failure, night O2 prn, has portable O2  --Pleural effusion s/p decortication --Pulmonary hypertension  Stasis dermatitis   PLAN: TCM 7 Acute on chronic CHF: Admitted to the hospital with shortness of breath, initially treated as a COPD exacerbation but later on during the admission the focus was on treated acute on chronic CHF  W/ IV  Lasix. Patient is feeling better. Back on his regular routine Lasix 40 mg daily. His weight earlier this year was around 202, 206 pounds.  Today is 213. Plan:  BMP, CBC Add a extra lasix Monday Wednesday and Friday Keep a weight log, call in 2 weeks.  Would like to be his weight closer to 200 pounds. He already has a follow-up with cardiology, they might have further input. Insomnia: Request a refill on zolpidem.  Sent RTC 6 w

## 2019-05-15 NOTE — Telephone Encounter (Signed)
90-day supply sent 2 months ago.   Will ask today about this refill

## 2019-05-15 NOTE — Patient Instructions (Signed)
GO TO THE LAB : Get the blood work     GO TO THE FRONT DESK Schedule your next appointment   for a checkup in 6 weeks  Weight yourself every day and keep a log  On Mondays Wednesdays and Fridays take a extra  Lasix in the afternoon for the next 2 weeks  Call me with your weights in 2 weeks

## 2019-05-15 NOTE — Progress Notes (Signed)
Pre visit review using our clinic review tool, if applicable. No additional management support is needed unless otherwise documented below in the visit note. 

## 2019-05-16 ENCOUNTER — Telehealth: Payer: Self-pay | Admitting: Internal Medicine

## 2019-05-16 NOTE — Assessment & Plan Note (Signed)
  TCM 7 Acute on chronic CHF: Admitted to the hospital with shortness of breath, initially treated as a COPD exacerbation but later on during the admission the focus was on treated acute on chronic CHF  W/ IV  Lasix. Patient is feeling better. Back on his regular routine Lasix 40 mg daily. His weight earlier this year was around 202, 206 pounds.  Today is 213. Plan:  BMP, CBC Add a extra lasix Monday Wednesday and Friday Keep a weight log, call in 2 weeks.  Would like to be his weight closer to 200 pounds. He already has a follow-up with cardiology, they might have further input. Insomnia: Request a refill on zolpidem.  Sent RTC 6 w

## 2019-05-16 NOTE — Telephone Encounter (Signed)
Spoke w/ Precious Bard- verbal orders given.

## 2019-05-16 NOTE — Telephone Encounter (Signed)
[facility] is calling and needs verbal order for skilled nursing 1x3 and patient last A1c

## 2019-05-22 ENCOUNTER — Telehealth: Payer: Self-pay

## 2019-05-22 NOTE — Telephone Encounter (Signed)
Copied from [place] [phone]. Topic: Quick Communication - See Telephone Encounter >> May 22, 2019  3:04 PM Loma Boston wrote: Caller/Agency: *[facility] is [phone] Wanting to report pt weight of 203 yesterday and 207 today. Does not note swelling. Is not complying with cardio diet requirements

## 2019-05-22 NOTE — Telephone Encounter (Signed)
FYI

## 2019-05-23 NOTE — Telephone Encounter (Signed)
We just increase Lasix dosing.  Recommend patient to watch salt intake, keep log of his weight.  If he continues gaining weight in the next 3 to 4 days, call the office

## 2019-05-23 NOTE — Telephone Encounter (Signed)
Spoke w/ Chong Sicilian- informed of PCP recommendations. Patty verbalized understanding.

## 2019-05-24 ENCOUNTER — Ambulatory Visit (INDEPENDENT_AMBULATORY_CARE_PROVIDER_SITE_OTHER): Payer: Medicare Other | Admitting: Cardiology

## 2019-05-24 ENCOUNTER — Encounter: Payer: Self-pay | Admitting: Cardiology

## 2019-05-24 ENCOUNTER — Other Ambulatory Visit: Payer: Self-pay

## 2019-05-24 VITALS — BP 122/78 | HR 89 | Temp 97.1°F | Ht 69.0 in | Wt 206.0 lb

## 2019-05-24 DIAGNOSIS — J9611 Chronic respiratory failure with hypoxia: Secondary | ICD-10-CM

## 2019-05-24 DIAGNOSIS — I739 Peripheral vascular disease, unspecified: Secondary | ICD-10-CM

## 2019-05-24 DIAGNOSIS — I495 Sick sinus syndrome: Secondary | ICD-10-CM

## 2019-05-24 DIAGNOSIS — I480 Paroxysmal atrial fibrillation: Secondary | ICD-10-CM | POA: Diagnosis not present

## 2019-05-24 DIAGNOSIS — I428 Other cardiomyopathies: Secondary | ICD-10-CM

## 2019-05-24 DIAGNOSIS — I25810 Atherosclerosis of coronary artery bypass graft(s) without angina pectoris: Secondary | ICD-10-CM

## 2019-05-24 DIAGNOSIS — I5033 Acute on chronic diastolic (congestive) heart failure: Secondary | ICD-10-CM

## 2019-05-24 DIAGNOSIS — I272 Pulmonary hypertension, unspecified: Secondary | ICD-10-CM

## 2019-05-24 DIAGNOSIS — I10 Essential (primary) hypertension: Secondary | ICD-10-CM

## 2019-05-24 DIAGNOSIS — Z950 Presence of cardiac pacemaker: Secondary | ICD-10-CM

## 2019-05-24 NOTE — Assessment & Plan Note (Signed)
Evaluated by Dr Melvyn Novas  Echo Dec 2019- PA 49 mmHg with severe TR

## 2019-05-24 NOTE — Assessment & Plan Note (Signed)
H/O AVN ablation No Coumadin due to an intracranial bleed 2013

## 2019-05-24 NOTE — Progress Notes (Signed)
Cardiology Office Note:    Date:  05/24/2019   ID:  Bradley SearlesJohn J Mixon Sr., DOB 04/24/1941, MRN [id number]  PCP:  Bradley [name], Bradley E, MD  Cardiologist:  Bradley Siiffany [place], MD  Electrophysiologist:  Dr Bradley [name]  Referring MD: Bradley [name], Bradley E, MD   CC: post hospital   History of Present Illness:    Bradley SearlesJohn J Stouffer Sr. is a 78 y.o. male with a hx of nonischemic cardiomyopathy.  He had a CRT device placed in 2012 and is a responder.  His last echocardiogram in December 2019 showed his ejection fraction to be 55 to 60%.  He does have pulmonary hypertension with a PA pressure of 49 and severe TR.  He has significant COPD, he has been followed by Dr. Sherene [name] in the past.  He is on chronic home O2.  He has a history of PAF but is not on anticoagulation secondary to a history of intercerebral bleed in 2013.  He had no coronary disease at catheterization in 2005 and 2018.  Patient was admitted to the hospital 05/07/2019 with acute respiratory failure.  He required BiPAP in the emergency room.  Initially was felt it was a COPD exacerbation and he was placed on steroids and inhalers.  A CT scan was obtained to rule out PE and suggested pulmonary edema.  He was admitted and diuresed.  It looks like he lost about 7 pounds.  He was discharged 05/11/2019.  Since discharge he has been followed by his primary care provider, his Lasix was increased to twice daily.  He seems to be doing well with this dose.  Patient's weight today is 206 pounds.  It appears his goal weight will probably be somewhere between 205 and 210 pounds.    The patient lives at home with his wife who is disabled because of hip DJD.  He tells me his granddaughter and her family have moved in with them and she does the cooking now.  He knows to avoid salt.    Past Medical History:  Diagnosis Date  . Abnormal CT scan, chest 2012   CT chest, several lymphadenopathies. Sees pulmonary  . Anemia    intermittent  . Arthritis    "?back" (09/19/2018)  . Atrial  fibrillation (HCC)    s/p AV node ablation & BiV PPM implantation 09/08/11 (op dictation pending)  . BPH (benign prostatic hyperplasia)    Saw Dr Bradley PlumpHumphries 2004, normal renal u/s  . Bronchitis 08/26/2017  . CAD (coronary artery disease)   . CHF (congestive heart failure) (HCC)    Thought primarily to be non-systolic although EF down (EF 32-44%45-50% 12/2010, down to 35-40% 09/05/11), cath 2008 with no CAD, nuclear study 07/2011 showing Small area of reversibility in the distal ant/lat wall the left ventricle suspicious for ischemia/septal wall HK but felt to be low risk  (per D/C Summary 07/2011)  . Chronic bronchitis (HCC)    "get it ~ q yr"  . Heart murmur   . HLD (hyperlipidemia)   . HTN (hypertension)   . 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 Insomnia   . Migraines    "very very rare"  . On home oxygen therapy    "2L; 24/7" (09/19/2018)  . Pacemaker   . Peripheral vascular disease (HCC)    ??  . Pleural effusion 2008   S/p decortication  . Pulmonary HTN (HCC)    per cath 2008  . Type II diabetes mellitus (HCC) 1999    Past  Surgical History:  Procedure Laterality Date  . BI-VENTRICULAR PACEMAKER INSERTION Left 09/08/2011   Procedure: BI-VENTRICULAR PACEMAKER INSERTION (CRT-P);  Surgeon: Evans Lance, MD;  Location: [facility] CATH LAB;  Service: Cardiovascular;  Laterality: Left;  . CARDIAC CATHETERIZATION     "couple times; never had balloon or stent" (09/19/2018)  . CATARACT EXTRACTION W/ INTRAOCULAR LENS  IMPLANT, BILATERAL Bilateral 08/2018  . COLONOSCOPY  03/10/11   normal  . INSERT / REPLACE / REMOVE PACEMAKER  09/08/11   pacemaker placement  . LUNG DECORTICATION    . PLEURAL SCARIFICATION    . pneumothorax with fibrothorax  ~ 2010  . TONSILLECTOMY     "as a kid"     Current Medications: Current Meds  Medication Sig  . acetaminophen (TYLENOL) 500 MG tablet Take 500 mg by mouth every 6 (six) hours as needed for headache.  Marland Kitchen amLODipine  (NORVASC) 10 MG tablet Take 1 tablet (10 mg total) by mouth daily.  Marland Kitchen aspirin 81 MG tablet Take 81 mg by mouth every morning.   Marland Kitchen atorvastatin (LIPITOR) 20 MG tablet Take 1 tablet (20 mg total) by mouth at bedtime.  . calcium-vitamin D (OSCAL WITH D) 500-200 MG-UNIT tablet Take 1 tablet by mouth every morning.  . carvedilol (COREG) 25 MG tablet Take 1 tablet (25 mg total) by mouth 2 (two) times daily with a meal.  . furosemide (LASIX) 40 MG tablet Take 1 tablet (40 mg total) by mouth daily.  Marland Kitchen ipratropium-albuterol (DUONEB) 0.5-2.5 (3) MG/3ML SOLN Take 3 mLs by nebulization 2 (two) times daily as needed.  . Lancets (ONETOUCH ULTRASOFT) lancets Check blood sugars no more than twice daily  . losartan (COZAAR) 100 MG tablet Take 0.5 tablets (50 mg total) by mouth at bedtime.  . Multiple Vitamins-Minerals (MULTIVITAMINS THER. W/MINERALS) TABS tablet Take 1 tablet by mouth daily.  . nitroGLYCERIN (NITROSTAT) 0.4 MG SL tablet Place 1 tablet (0.4 mg total) under the tongue every 5 (five) minutes x 3 doses as needed for chest pain.  Glory Rosebush VERIO test strip USE TO CHECK BLOOD SUGAR NO MORE THAN TWICE A DAY  . pantoprazole (PROTONIX) 40 MG tablet Take 1 tablet (40 mg total) by mouth every morning.  . sitaGLIPtin (JANUVIA) 100 MG tablet Take 1 tablet (100 mg total) by mouth daily. (Patient taking differently: Take 100 mg by mouth every morning. )  . tamsulosin (FLOMAX) 0.4 MG CAPS capsule Take 1 capsule (0.4 mg total) by mouth daily. (Patient taking differently: Take 0.4 mg by mouth 2 (two) times daily. For 2 weeks (started last week); then decrease to one tablet daily.)  . zolpidem (AMBIEN) 10 MG tablet Take 0.5 tablets (5 mg total) by mouth at bedtime as needed for sleep.     Allergies:   Hydrocodone, Tramadol, Tadalafil, and Avelox [moxifloxacin hydrochloride]   Social History   Socioeconomic History  . Marital status: Married    Spouse name: Bradley [name]  . Number of children: 1  . Years of  education: Not on file  . Highest education level: Not on file  Occupational History  . Occupation: retired Magazine features editor: RETIRED  Social Needs  . Financial resource strain: Not hard at all  . Food insecurity    Worry: Never true    Inability: Never true  . Transportation needs    Medical: No    Non-medical: No  Tobacco Use  . Smoking status: Former Smoker    Packs/day: 2.50    Years: 25.00  Pack years: 62.50    Types: Cigarettes, Cigars    Quit date: 10/11/1980    Years since quitting: 38.6  . Smokeless tobacco: Never Used  Substance and Sexual Activity  . Alcohol use: Not Currently    Comment: 1 can of beer ever 4-5 months  . Drug use: No  . Sexual activity: Not Currently  Lifestyle  . Physical activity    Days per week: 6 days    Minutes per session: 20 min  . Stress: Not at all  Relationships  . Social connections    Talks on phone: More than three times a week    Gets together: More than three times a week    Attends religious service: More than 4 times per year    Active member of club or organization: No    Attends meetings of clubs or organizations: Never    Relationship status: Married  Other Topics Concern  . Not on file  Social History Narrative   Moved from [place] 2003.[place].   He lives w/ his wife, [name]   1 son, substance abuse, passed away 09/2016           Family History: The patient's family history includes Breast cancer in his maternal aunt; Coronary artery disease in his father; Diabetes in his father; Drug abuse in his son; Polycythemia in his mother. There is no history of Prostate cancer or Colon cancer.  ROS:   Please see the history of present illness.     All other systems reviewed and are negative.  EKGs/Labs/Other Studies Reviewed:    The following studies were reviewed today: Echo 2019  EKG:  EKG is ordered today.  The ekg ordered today demonstrates Paced-88  Recent Labs: 06/07/2018: TSH 1.61 12/21/2018: B Natriuretic  Peptide 237.9 04/18/2019: ALT 11 05/09/2019: Magnesium 2.1 05/15/2019: BUN 11; Creatinine, Ser 0.75; Hemoglobin 13.3; Platelets 220.0; Potassium 5.2; Sodium 140  Recent Lipid Panel    Component Value Date/Time   CHOL 105 12/12/2018 1409   TRIG 120.0 12/12/2018 1409   HDL 31.50 (L) 12/12/2018 1409   CHOLHDL 3 12/12/2018 1409   VLDL 24.0 12/12/2018 1409   LDLCALC 50 12/12/2018 1409    Physical Exam:    VS:  BP 122/78   Pulse 89   Temp (!) 97.1 F (36.2 C) (Temporal)   Ht 5\' 9"  (1.753 m)   Wt 206 lb (93.4 kg)   BMI 30.42 kg/m     Wt Readings from Last 3 Encounters:  05/24/19 206 lb (93.4 kg)  05/15/19 213 lb 4 oz (96.7 kg)  05/11/19 209 lb 8 oz (95 kg)     GEN:  Overweight male, on O2, well developed in no acute distress HEENT: Normal NECK: No JVD; No carotid bruits LYMPHATICS: No lymphadenopathy CARDIAC: RRR, no murmurs, rubs, gallops RESPIRATORY:  Few basilar crackles  ABDOMEN: truncal obesity non-tender, non-distended MUSCULOSKELETAL:  No edema; No deformity  SKIN: Warm and dry NEUROLOGIC:  Alert and oriented x 3 PSYCHIATRIC:  Normal affect   ASSESSMENT:    Acute on chronic diastolic (congestive) heart failure (HCC) Recent Hospitalization 7/27-/731/2020 for respiratory failure, diuresed 7 lbs.  Doing well- his Lasix was increased to BID.  It appears his goal weight is 205-210 bs.   Chronic pulmonary hypertension secondary to elevated L H pressures  Evaluated by Dr Bradley [name]  Echo Dec 2019- PA 49 mmHg with severe TR  Non-ischemic cardiomyopathy (HCC) CRT responder- EF 55-60% Dec 2019  Permanent atrial fibrillation H/O AVN ablation  No Coumadin due to an intracranial bleed 2013  Pacemaker-Medtronic MDT CRT-P 2012  Chronic respiratory failure with hypoxia (HCC) Home O2-4L  Essential hypertension Controlled  PLAN:    No change in Rx- follow up with Dr Duke Salvia in 3 months.    Medication Adjustments/Labs and Tests Ordered: Current medicines are reviewed at  length with the patient today.  Concerns regarding medicines are outlined above.  Orders Placed This Encounter  Procedures  . EKG 12-Lead   No orders of the defined types were placed in this encounter.   Patient Instructions  Medication Instructions:  Your physician recommends that you continue on your current medications as directed. Please refer to the Current Medication list given to you today.  If you need a refill on your cardiac medications before your next appointment, please call your pharmacy.   Follow-Up: At [facility], you and your health needs are our priority.  As part of our continuing mission to provide you with exceptional heart care, we have created designated Provider Care Teams.  These Care Teams include your primary Cardiologist (physician) and Advanced Practice Providers (APPs -  Physician Assistants and Nurse Practitioners) who all work together to provide you with the care you need, when you need it. You will need a follow up appointment in 3 months.  Please call our office 2 months in advance to schedule this appointment.  You may see Bradley Si, MD or one of the following Advanced Practice Providers on your designated Care Team:   [name], PA-C Judy Pimple, [place] . Marjie Skiff, PA-C  Any Other Special Instructions Will Be Listed Below (If Applicable). Please review The Salty Six--foods that can add high levels of sodium to your diet.       Signed, [name], PA-C  05/24/2019 10:05 AM    [facility]

## 2019-05-24 NOTE — Patient Instructions (Signed)
Medication Instructions:  Your physician recommends that you continue on your current medications as directed. Please refer to the Current Medication list given to you today.  If you need a refill on your cardiac medications before your next appointment, please call your pharmacy.   Follow-Up: At [facility], you and your health needs are our priority.  As part of our continuing mission to provide you with exceptional heart care, we have created designated Provider Care Teams.  These Care Teams include your primary Cardiologist (physician) and Advanced Practice Providers (APPs -  Physician Assistants and Nurse Practitioners) who all work together to provide you with the care you need, when you need it. You will need a follow up appointment in 3 months.  Please call our office 2 months in advance to schedule this appointment.  You may see Skeet Latch, MD or one of the following Advanced Practice Providers on your designated Care Team:   [name], PA-C Roby Lofts, [place] . Sande Rives, PA-C  Any Other Special Instructions Will Be Listed Below (If Applicable). Please review The Salty Six--foods that can add high levels of sodium to your diet.

## 2019-05-24 NOTE — Assessment & Plan Note (Signed)
Home O2-4L

## 2019-05-24 NOTE — Assessment & Plan Note (Signed)
MDT CRT-P 2012

## 2019-05-24 NOTE — Assessment & Plan Note (Signed)
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

## 2019-05-24 NOTE — Assessment & Plan Note (Signed)
Controlled.  

## 2019-05-24 NOTE — Assessment & Plan Note (Signed)
Recent Hospitalization 7/27-/731/2020 for respiratory failure, diuresed 7 lbs.  Doing well- his Lasix was increased to BID.  It appears his goal weight is 205-210 bs.

## 2019-05-26 ENCOUNTER — Other Ambulatory Visit: Payer: Self-pay | Admitting: Internal Medicine

## 2019-05-29 ENCOUNTER — Ambulatory Visit (INDEPENDENT_AMBULATORY_CARE_PROVIDER_SITE_OTHER): Payer: Medicare Other | Admitting: *Deleted

## 2019-05-29 DIAGNOSIS — I495 Sick sinus syndrome: Secondary | ICD-10-CM

## 2019-05-29 LAB — CUP PACEART REMOTE DEVICE CHECK
Battery Remaining Longevity: 38 mo
Battery Voltage: 2.94 V
Brady Statistic AP VP Percent: 0 %
Brady Statistic AP VS Percent: 0 %
Brady Statistic AS VP Percent: 99.2 %
Brady Statistic AS VS Percent: 0.8 %
Brady Statistic RA Percent Paced: 0 %
Brady Statistic RV Percent Paced: 99.73 %
Date Time Interrogation Session: 20200818161023
Implantable Lead Implant Date: 20121128
Implantable Lead Implant Date: 20121128
Implantable Lead Location: 753858
Implantable Lead Location: 753860
Implantable Lead Model: 4194
Implantable Lead Model: 5076
Implantable Pulse Generator Implant Date: 20121128
Lead Channel Impedance Value: 342 Ohm
Lead Channel Impedance Value: 4047 Ohm
Lead Channel Impedance Value: 4047 Ohm
Lead Channel Impedance Value: 418 Ohm
Lead Channel Impedance Value: 418 Ohm
Lead Channel Impedance Value: 513 Ohm
Lead Channel Impedance Value: 551 Ohm
Lead Channel Impedance Value: 570 Ohm
Lead Channel Impedance Value: 570 Ohm
Lead Channel Pacing Threshold Amplitude: 0.75 V
Lead Channel Pacing Threshold Amplitude: 1 V
Lead Channel Pacing Threshold Pulse Width: 0.4 ms
Lead Channel Pacing Threshold Pulse Width: 0.4 ms
Lead Channel Sensing Intrinsic Amplitude: 5 mV
Lead Channel Sensing Intrinsic Amplitude: 5 mV
Lead Channel Setting Pacing Amplitude: 2 V
Lead Channel Setting Pacing Amplitude: 2 V
Lead Channel Setting Pacing Pulse Width: 0.4 ms
Lead Channel Setting Pacing Pulse Width: 0.4 ms
Lead Channel Setting Sensing Sensitivity: 4 mV

## 2019-05-30 ENCOUNTER — Telehealth: Payer: Self-pay

## 2019-05-30 NOTE — Telephone Encounter (Signed)
Copied from [place] [id number]. Topic: Quick Communication - Home Health Verbal Orders >> May 30, 2019  9:11 AM Rainey Pines A wrote: Chong Sicilian from [facility] would like a callback from Dr. Larose Kells nurse in regards to some findings for patient on last visit. Best contact number [phone]

## 2019-05-30 NOTE — Telephone Encounter (Signed)
LMOM for Patty to return call. Okay for PEC to discuss.  

## 2019-05-31 ENCOUNTER — Telehealth: Payer: Self-pay | Admitting: Internal Medicine

## 2019-05-31 NOTE — Telephone Encounter (Signed)
Spoke with the patient, reports he is taking Lasix 40 mg "as prescribed".    Per his own scales his weight is around 200.  Today is 201. Plan: -Continue Lasix twice a day, continue monitoring weight. - Self stop Ambien, Tylenol PM works better, that is okay for now. -Follow-up as recommended in about 3 weeks, call sooner if problems.

## 2019-05-31 NOTE — Telephone Encounter (Signed)
FYI

## 2019-05-31 NOTE — Telephone Encounter (Signed)
Patient is calling back for Dr. Larose Kells to discuss his weight. The patient was driving when Dr. Larose Kells called. Please advise CB- [phone]

## 2019-05-31 NOTE — Telephone Encounter (Signed)
Talked to [facility] from [facility], she reports patient is finished with his instructions to take one extra lasix on Mon, Wed and Fridays. He is back to one daily.  On visit yesterday patient had a 2 pounds weight increase with no swelling on his legs.  His BP was 102/60.  She also wanted to let Dr. Larose Kells know patient is not compliant with his diet.  Yesterday was her last visit with patient, she will also like verbal orders for additional visits one week x 2 plus 2 prn visits due to patient's condition.

## 2019-06-01 ENCOUNTER — Telehealth: Payer: Self-pay

## 2019-06-01 NOTE — Telephone Encounter (Signed)
Copied from [place] [id number]. Topic: Quick Communication - Home Health Verbal Orders >> May 30, 2019  9:11 AM Rainey Pines A wrote: Chong Sicilian from [facility] would like a callback from Dr. Larose Kells nurse in regards to some findings for patient on last visit. Best contact number [phone] >> Jun 01, 2019 10:43 AM Yvette Rack wrote: Chong Sicilian with [facility] stated she was calling to follow up regarding order for CHF. Cb# [phone]

## 2019-06-01 NOTE — Telephone Encounter (Signed)
I already discussed instructions with the patient yesterday (05/31/2019).  Is this a new call?

## 2019-06-01 NOTE — Telephone Encounter (Signed)
Unsure what orders she is talking about- I viewed other telephone note- did not see any orders- PCP has already discussed and spoke personally to the Pt.

## 2019-06-07 ENCOUNTER — Encounter: Payer: Self-pay | Admitting: Cardiology

## 2019-06-07 NOTE — Progress Notes (Signed)
Remote pacemaker transmission.   

## 2019-06-13 ENCOUNTER — Ambulatory Visit (INDEPENDENT_AMBULATORY_CARE_PROVIDER_SITE_OTHER): Payer: Medicare Other | Admitting: Internal Medicine

## 2019-06-13 ENCOUNTER — Ambulatory Visit (HOSPITAL_BASED_OUTPATIENT_CLINIC_OR_DEPARTMENT_OTHER)
Admission: RE | Admit: 2019-06-13 | Discharge: 2019-06-13 | Disposition: A | Discharge status: Home / Self Care | Payer: Medicare Other | Source: Ambulatory Visit | Attending: Internal Medicine | Admitting: Internal Medicine

## 2019-06-13 ENCOUNTER — Other Ambulatory Visit: Payer: Self-pay

## 2019-06-13 ENCOUNTER — Telehealth: Payer: Self-pay | Admitting: Internal Medicine

## 2019-06-13 DIAGNOSIS — R05 Cough: Secondary | ICD-10-CM

## 2019-06-13 DIAGNOSIS — I25810 Atherosclerosis of coronary artery bypass graft(s) without angina pectoris: Secondary | ICD-10-CM

## 2019-06-13 DIAGNOSIS — R059 Cough, unspecified: Secondary | ICD-10-CM

## 2019-06-13 NOTE — Telephone Encounter (Signed)
Spoke w/ Pt- he is feeling fine- nurse checked his temp and O2 earlier today and was normal. He will come for chest x-ray this PM and informed him that PCP will call around 4:20pm for virtual visit. Pt verbalized understanding.

## 2019-06-13 NOTE — Telephone Encounter (Signed)
Please advise 

## 2019-06-13 NOTE — Telephone Encounter (Signed)
Call patient: If he has high fevers or is feeling poorly needs to go to the ER If he is not: Arrange a chest x-ray today, DX of Ask  him to check his oxygen levels and temperature. Arrange a  virtual visit for late in the day.  4:20 PM okay.

## 2019-06-13 NOTE — Progress Notes (Signed)
Subjective:    Patient ID: Bradley [name]., male    DOB: 1941-01-05, 78 y.o.   MRN: [id number]  DOS:  06/13/2019 Type of visit - description: Attempted  to make this a video visit, due to technical difficulties from the patient side it was not possible  thus we proceeded with a Virtual Visit via Telephone    I connected with@   by telephone and verified that I am speaking with the correct person using two identifiers.  THIS ENCOUNTER IS A VIRTUAL VISIT DUE TO COVID-19 - PATIENT WAS NOT SEEN IN THE OFFICE. PATIENT HAS CONSENTED TO VIRTUAL VISIT / TELEMEDICINE VISIT   Location of patient: home  Location of provider: office  I discussed the limitations, risks, security and privacy concerns of performing an evaluation and management service by telephone and the availability of in person appointments. I also discussed with the patient that there may be a patient responsible charge related to this service. The patient expressed understanding and agreed to proceed.   History of Present Illness: Acute See phone note, I got a report that the patient was coughing and he had crackles at the left lower lobe. I talked to the patient today, he reports that he is actually feeling great. Denies fever chills No cough, no swelling. O2 sat is 95-98 at home He weight has not changed, this morning was 190 pounds similar to few days ago. Chest x-ray is pending.     Review of Systems   Past Medical History:  Diagnosis Date  . Abnormal CT scan, chest 2012   CT chest, several lymphadenopathies. Sees pulmonary  . Anemia    intermittent  . Arthritis    "?back" (09/19/2018)  . Atrial fibrillation (HCC)    s/p AV node ablation & BiV PPM implantation 09/08/11 (op dictation pending)  . BPH (benign prostatic hyperplasia)    Saw Dr [name] 2004, normal renal u/s  . Bronchitis 08/26/2017  . CAD (coronary artery disease)   . CHF (congestive heart failure) (HCC)    Thought primarily to be non-systolic  although EF down (EF 45-50% 12/2010, down to 35-40% 09/05/11), cath 2008 with no CAD, nuclear study 07/2011 showing Small area of reversibility in the distal ant/lat wall the left ventricle suspicious for ischemia/septal wall HK but felt to be low risk  (per D/C Summary 07/2011)  . Chronic bronchitis (HCC)    "get it ~ q yr"  . Heart murmur   . HLD (hyperlipidemia)   . HTN (hypertension)   . 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 Insomnia   . Migraines    "very very rare"  . On home oxygen therapy    "2L; 24/7" (09/19/2018)  . Pacemaker   . Peripheral vascular disease (HCC)    ??  . Pleural effusion 2008   S/p decortication  . Pulmonary HTN (HCC)    per cath 2008  . Type II diabetes mellitus (HCC) 1999    Past Surgical History:  Procedure Laterality Date  . BI-VENTRICULAR PACEMAKER INSERTION Left 09/08/2011   Procedure: BI-VENTRICULAR PACEMAKER INSERTION (CRT-P);  Surgeon: Marinus Maw, MD;  Location: [facility] CATH LAB;  Service: Cardiovascular;  Laterality: Left;  . CARDIAC CATHETERIZATION     "couple times; never had balloon or stent" (09/19/2018)  . CATARACT EXTRACTION W/ INTRAOCULAR LENS  IMPLANT, BILATERAL Bilateral 08/2018  . COLONOSCOPY  03/10/11   normal  . INSERT / REPLACE / REMOVE PACEMAKER  09/08/11  pacemaker placement  . LUNG DECORTICATION    . PLEURAL SCARIFICATION    . pneumothorax with fibrothorax  ~ 2010  . TONSILLECTOMY     "as a kid"     Social History   Socioeconomic History  . Marital status: Married    Spouse name: [name]  . Number of children: 1  . Years of education: Not on file  . Highest education level: Not on file  Occupational History  . Occupation: retired Magazine features editor: RETIRED  Social Needs  . Financial resource strain: Not hard at all  . Food insecurity    Worry: Never true    Inability: Never true  . Transportation needs    Medical: No    Non-medical: No  Tobacco Use  . Smoking  status: Former Smoker    Packs/day: 2.50    Years: 25.00    Pack years: 62.50    Types: Cigarettes, Cigars    Quit date: 10/11/1980    Years since quitting: 38.6  . Smokeless tobacco: Never Used  Substance and Sexual Activity  . Alcohol use: Not Currently    Comment: 1 can of beer ever 4-5 months  . Drug use: No  . Sexual activity: Not Currently  Lifestyle  . Physical activity    Days per week: 6 days    Minutes per session: 20 min  . Stress: Not at all  Relationships  . Social connections    Talks on phone: More than three times a week    Gets together: More than three times a week    Attends religious service: More than 4 times per year    Active member of club or organization: No    Attends meetings of clubs or organizations: Never    Relationship status: Married  . Intimate partner violence    Fear of current or ex partner: Not on file    Emotionally abused: Not on file    Physically abused: Not on file    Forced sexual activity: Not on file  Other Topics Concern  . Not on file  Social History Narrative   Moved from [place]   He lives w/ his wife, [place]   1 son, substance abuse, passed away 10/06/16            Allergies as of 06/13/2019      Reactions   Hydrocodone Other (See Comments)   "given to him in the hospital; went thru withdrawals once home; dr said not to take it again" (09/27/2012)   Tramadol Other (See Comments)   "given to him in the hospital; went thru withdrawals once home; dr said not to take it again" (09/27/2012)   Tadalafil Other (See Comments)   headache, backache   Avelox [moxifloxacin Hydrochloride] Itching, Other (See Comments)   Headache      Medication List       Accurate as of June 13, 2019 11:59 PM. If you have any questions, ask your nurse or doctor.        acetaminophen 500 MG tablet Commonly known as: TYLENOL Take 500 mg by mouth every 6 (six) hours as needed for headache.   amLODipine 10 MG tablet Commonly known  as: NORVASC Take 1 tablet (10 mg total) by mouth daily.   aspirin 81 MG tablet Take 81 mg by mouth every morning.   atorvastatin 20 MG tablet Commonly known as: LIPITOR Take 1 tablet (20 mg total) by mouth at bedtime.  budesonide 0.5 MG/2ML nebulizer solution Commonly known as: PULMICORT Take 2 mLs (0.5 mg total) by nebulization 2 (two) times daily for 30 days.   calcium-vitamin D 500-200 MG-UNIT tablet Commonly known as: OSCAL WITH D Take 1 tablet by mouth every morning.   carvedilol 25 MG tablet Commonly known as: COREG Take 1 tablet (25 mg total) by mouth 2 (two) times daily with a meal.   furosemide 40 MG tablet Commonly known as: LASIX Take 1 tablet (40 mg total) by mouth 2 (two) times daily.   ipratropium-albuterol 0.5-2.5 (3) MG/3ML Soln Commonly known as: DUONEB Take 3 mLs by nebulization 2 (two) times daily as needed.   losartan 100 MG tablet Commonly known as: COZAAR Take 0.5 tablets (50 mg total) by mouth at bedtime.   multivitamins ther. w/minerals Tabs tablet Take 1 tablet by mouth daily.   nitroGLYCERIN 0.4 MG SL tablet Commonly known as: NITROSTAT Place 1 tablet (0.4 mg total) under the tongue every 5 (five) minutes x 3 doses as needed for chest pain.   onetouch ultrasoft lancets Check blood sugars no more than twice daily   OneTouch Verio test strip Generic drug: glucose blood USE TO CHECK BLOOD SUGAR NO MORE THAN TWICE A DAY   pantoprazole 40 MG tablet Commonly known as: PROTONIX Take 1 tablet (40 mg total) by mouth every morning.   sitaGLIPtin 100 MG tablet Commonly known as: Januvia Take 1 tablet (100 mg total) by mouth daily.   tamsulosin 0.4 MG Caps capsule Commonly known as: FLOMAX Take 1 capsule (0.4 mg total) by mouth daily. What changed:   when to take this  additional instructions   Tylenol PM Extra Strength 25-500 MG Tabs tablet Generic drug: diphenhydramine-acetaminophen Take 1 tablet by mouth at bedtime.            Objective:   Physical Exam There were no vitals taken for this visit. This is a virtual phone visit.  He seems alert oriented x3, speaking in complete sentences, in no distress    Assessment     Assessment  DM HTN Hyperlipidemia GERD Anxiety- insomnia  BPH Cardiovascular:Dr [name]  --  Cath x 2 [id number]: no significant CAD -- CHF, non-ischemic  --Atrial fibrillation; h/o AV node ablation, has a Pacemaker --h/o IC bleeding: Not anticoagulated --Peripheral vascular disease? Pulmonary: Dr Sherene SiresWert, last OV 10-18-2014, f/u prn --COPD, severe, chronic resp failure, night O2 prn, has portable O2  --Pleural effusion s/p decortication --Pulmonary hypertension  Stasis dermatitis   PLAN: Cough, crackles: As reported by home health. During this visit he reports he is actually doing great and denies cough, fever, chest pain or hypoxia. Plan: Chest x-ray pending, will call him with the results, continue same medications    I discussed the assessment and treatment plan with the patient. The patient was provided an opportunity to ask questions and all were answered. The patient agreed with the plan and demonstrated an understanding of the instructions.   The patient was advised to call back or seek an in-person evaluation if the symptoms worsen or if the condition fails to improve as anticipated.  I provided 12 minutes of non-face-to-face time during this encounter.  Willow OraJose Meily Glowacki, MD

## 2019-06-13 NOTE — Telephone Encounter (Signed)
Patty called stating she had her last visit with pt today. She states pt had rales in left lower lobe and a productive cough. Please advise.   [phone]

## 2019-06-14 ENCOUNTER — Other Ambulatory Visit: Payer: Self-pay | Admitting: Internal Medicine

## 2019-06-14 NOTE — Assessment & Plan Note (Signed)
Cough, crackles: As reported by home health. During this visit he reports he is actually doing great and denies cough, fever, chest pain or hypoxia. Plan: Chest x-ray pending, will call him with the results, continue same medications

## 2019-06-19 ENCOUNTER — Ambulatory Visit: Payer: Medicare Other | Admitting: Internal Medicine

## 2019-06-26 ENCOUNTER — Encounter: Payer: Self-pay | Admitting: Internal Medicine

## 2019-06-26 ENCOUNTER — Telehealth: Payer: Self-pay

## 2019-06-26 ENCOUNTER — Ambulatory Visit (INDEPENDENT_AMBULATORY_CARE_PROVIDER_SITE_OTHER): Payer: Medicare Other | Admitting: Internal Medicine

## 2019-06-26 ENCOUNTER — Other Ambulatory Visit: Payer: Self-pay

## 2019-06-26 VITALS — BP 121/69 | HR 88 | Temp 96.9°F | Resp 18 | Ht 69.0 in | Wt 206.1 lb

## 2019-06-26 DIAGNOSIS — I10 Essential (primary) hypertension: Secondary | ICD-10-CM

## 2019-06-26 DIAGNOSIS — I25810 Atherosclerosis of coronary artery bypass graft(s) without angina pectoris: Secondary | ICD-10-CM

## 2019-06-26 DIAGNOSIS — I25119 Atherosclerotic heart disease of native coronary artery with unspecified angina pectoris: Secondary | ICD-10-CM | POA: Diagnosis not present

## 2019-06-26 DIAGNOSIS — I5032 Chronic diastolic (congestive) heart failure: Secondary | ICD-10-CM | POA: Diagnosis not present

## 2019-06-26 DIAGNOSIS — J9611 Chronic respiratory failure with hypoxia: Secondary | ICD-10-CM

## 2019-06-26 DIAGNOSIS — Z23 Encounter for immunization: Secondary | ICD-10-CM | POA: Diagnosis not present

## 2019-06-26 DIAGNOSIS — I5033 Acute on chronic diastolic (congestive) heart failure: Secondary | ICD-10-CM | POA: Diagnosis not present

## 2019-06-26 DIAGNOSIS — Z7982 Long term (current) use of aspirin: Secondary | ICD-10-CM

## 2019-06-26 DIAGNOSIS — Z950 Presence of cardiac pacemaker: Secondary | ICD-10-CM

## 2019-06-26 DIAGNOSIS — J9621 Acute and chronic respiratory failure with hypoxia: Secondary | ICD-10-CM | POA: Diagnosis not present

## 2019-06-26 DIAGNOSIS — G4700 Insomnia, unspecified: Secondary | ICD-10-CM

## 2019-06-26 DIAGNOSIS — E1151 Type 2 diabetes mellitus with diabetic peripheral angiopathy without gangrene: Secondary | ICD-10-CM

## 2019-06-26 DIAGNOSIS — I110 Hypertensive heart disease with heart failure: Secondary | ICD-10-CM | POA: Diagnosis not present

## 2019-06-26 DIAGNOSIS — Z87891 Personal history of nicotine dependence: Secondary | ICD-10-CM

## 2019-06-26 DIAGNOSIS — M1990 Unspecified osteoarthritis, unspecified site: Secondary | ICD-10-CM

## 2019-06-26 DIAGNOSIS — Z7984 Long term (current) use of oral hypoglycemic drugs: Secondary | ICD-10-CM

## 2019-06-26 DIAGNOSIS — N400 Enlarged prostate without lower urinary tract symptoms: Secondary | ICD-10-CM

## 2019-06-26 DIAGNOSIS — E785 Hyperlipidemia, unspecified: Secondary | ICD-10-CM

## 2019-06-26 DIAGNOSIS — I4821 Permanent atrial fibrillation: Secondary | ICD-10-CM

## 2019-06-26 DIAGNOSIS — Z7951 Long term (current) use of inhaled steroids: Secondary | ICD-10-CM

## 2019-06-26 DIAGNOSIS — I272 Pulmonary hypertension, unspecified: Secondary | ICD-10-CM

## 2019-06-26 DIAGNOSIS — D649 Anemia, unspecified: Secondary | ICD-10-CM

## 2019-06-26 NOTE — Progress Notes (Signed)
Subjective:    Patient ID: Bradley SitJohn J Perl Sr., male    DOB: 01/17/1941, 78 y.o.   MRN: [id number]  DOS:  06/26/2019 Type of visit - description: Follow-up Patient is doing great, we reviewed his ambulatory weights and O2 sats. He is able to do all his activities of daily living without any problem.  Wt Readings from Last 3 Encounters:  06/26/19 206 lb 2 oz (93.5 kg)  05/24/19 206 lb (93.4 kg)  05/15/19 213 lb 4 oz (96.7 kg)     Review of Systems Denies chest pain, lower extremity edema or palpitations. Minimal cough. Sleeping better  Past Medical History:  Diagnosis Date  . Abnormal CT scan, chest 2012   CT chest, several lymphadenopathies. Sees pulmonary  . Anemia    intermittent  . Arthritis    "?back" (09/19/2018)  . Atrial fibrillation (HCC)    s/p AV node ablation & BiV PPM implantation 09/08/11 (op dictation pending)  . BPH (benign prostatic hyperplasia)    Saw Dr Wanda PlumpHumphries 2004, normal renal u/s  . Bronchitis 08/26/2017  . CAD (coronary artery disease)   . CHF (congestive heart failure) (HCC)    Thought primarily to be non-systolic although EF down (EF 40-98%45-50% 12/2010, down to 35-40% 09/05/11), cath 2008 with no CAD, nuclear study 07/2011 showing Small area of reversibility in the distal ant/lat wall the left ventricle suspicious for ischemia/septal wall HK but felt to be low risk  (per D/C Summary 07/2011)  . Chronic bronchitis (HCC)    "get it ~ q yr"  . Heart murmur   . HLD (hyperlipidemia)   . HTN (hypertension)   . 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 Insomnia   . Migraines    "very very rare"  . On home oxygen therapy    "2L; 24/7" (09/19/2018)  . Pacemaker   . Peripheral vascular disease (HCC)    ??  . Pleural effusion 2008   S/p decortication  . Pulmonary HTN (HCC)    per cath 2008  . Type II diabetes mellitus (HCC) 1999    Past Surgical History:  Procedure Laterality Date  . BI-VENTRICULAR PACEMAKER INSERTION  Left 09/08/2011   Procedure: BI-VENTRICULAR PACEMAKER INSERTION (CRT-P);  Surgeon: Marinus MawGregg W Taylor, MD;  [facility];  Service: Cardiovascular;  Laterality: Left;  . CARDIAC CATHETERIZATION     "couple times; never had balloon or stent" (09/19/2018)  . CATARACT EXTRACTION W/ INTRAOCULAR LENS  IMPLANT, BILATERAL Bilateral 08/2018  . COLONOSCOPY  03/10/11   normal  . INSERT / REPLACE / REMOVE PACEMAKER  09/08/11   pacemaker placement  . LUNG DECORTICATION    . PLEURAL SCARIFICATION    . pneumothorax with fibrothorax  ~ 2010  . TONSILLECTOMY     "as a kid"     Social History   Socioeconomic History  . Marital status: Married    Spouse name: [place] Earhart  . Number of children: 1  . Years of education: Not on file  . Highest education level: Not on file  Occupational History  . Occupation: retired Publishing rights managertruck driver    Employer: RETIRED  Social Needs  . Financial resource strain: Not hard at all  . Food insecurity    Worry: Never true    Inability: Never true  . Transportation needs    Medical: No    Non-medical: No  Tobacco Use  . Smoking status: Former Smoker    Packs/day: 2.50    Years:  25.00    Pack years: 62.50    Types: Cigarettes, Cigars    Quit date: 10/11/1980    Years since quitting: 38.7  . Smokeless tobacco: Never Used  Substance and Sexual Activity  . Alcohol use: Not Currently    Comment: 1 can of beer ever 4-5 months  . Drug use: No  . Sexual activity: Not Currently  Lifestyle  . Physical activity    Days per week: 6 days    Minutes per session: 20 min  . Stress: Not at all  Relationships  . Social connections    Talks on phone: More than three times a week    Gets together: More than three times a week    Attends religious service: More than 4 times per year    Active member of club or organization: No    Attends meetings of clubs or organizations: Never    Relationship status: Married  . Intimate partner violence    Fear of current or ex  partner: Not on file    Emotionally abused: Not on file    Physically abused: Not on file    Forced sexual activity: Not on file  Other Topics Concern  . Not on file  Social History Narrative   Moved from [place] 2003.[place]   He lives w/ his wife, [name]   1 son, substance abuse, passed away 10-12-16            Allergies as of 06/26/2019      Reactions   Hydrocodone Other (See Comments)   "given to him in the hospital; went thru withdrawals once home; dr said not to take it again" (09/27/2012)   Tramadol Other (See Comments)   "given to him in the hospital; went thru withdrawals once home; dr said not to take it again" (09/27/2012)   Tadalafil Other (See Comments)   headache, backache   Avelox [moxifloxacin Hydrochloride] Itching, Other (See Comments)   Headache      Medication List       Accurate as of June 26, 2019  9:35 AM. If you have any questions, ask your nurse or doctor.        acetaminophen 500 MG tablet Commonly known as: TYLENOL Take 500 mg by mouth every 6 (six) hours as needed for headache.   amLODipine 10 MG tablet Commonly known as: NORVASC Take 1 tablet (10 mg total) by mouth daily.   aspirin 81 MG tablet Take 81 mg by mouth every morning.   atorvastatin 20 MG tablet Commonly known as: LIPITOR Take 1 tablet (20 mg total) by mouth at bedtime.   budesonide 0.5 MG/2ML nebulizer solution Commonly known as: PULMICORT Take 2 mLs (0.5 mg total) by nebulization 2 (two) times daily for 30 days.   calcium-vitamin D 500-200 MG-UNIT tablet Commonly known as: OSCAL WITH D Take 1 tablet by mouth every morning.   carvedilol 25 MG tablet Commonly known as: COREG Take 1 tablet (25 mg total) by mouth 2 (two) times daily with a meal.   furosemide 40 MG tablet Commonly known as: LASIX Take 1 tablet (40 mg total) by mouth 2 (two) times daily.   ipratropium-albuterol 0.5-2.5 (3) MG/3ML Soln Commonly known as: DUONEB Take 3 mLs by nebulization 2 (two) times  daily as needed.   losartan 100 MG tablet Commonly known as: COZAAR Take 0.5 tablets (50 mg total) by mouth at bedtime.   multivitamins ther. w/minerals Tabs tablet Take 1 tablet by mouth daily.   nitroGLYCERIN  0.4 MG SL tablet Commonly known as: NITROSTAT Place 1 tablet (0.4 mg total) under the tongue every 5 (five) minutes x 3 doses as needed for chest pain.   onetouch ultrasoft lancets Check blood sugars no more than twice daily   OneTouch Verio test strip Generic drug: glucose blood USE TO CHECK BLOOD SUGAR NO MORE THAN TWICE A DAY   pantoprazole 40 MG tablet Commonly known as: PROTONIX Take 1 tablet (40 mg total) by mouth every morning.   sitaGLIPtin 100 MG tablet Commonly known as: Januvia Take 1 tablet (100 mg total) by mouth daily.   tamsulosin 0.4 MG Caps capsule Commonly known as: FLOMAX Take 1 capsule (0.4 mg total) by mouth daily after supper.   Tylenol PM Extra Strength 25-500 MG Tabs tablet Generic drug: diphenhydramine-acetaminophen Take 1 tablet by mouth at bedtime.           Objective:   Physical Exam BP 121/69 (BP Location: Right Arm, Patient Position: Sitting, Cuff Size: Normal)   Pulse 88   Temp (!) 96.9 F (36.1 C) (Temporal)   Resp 18   Ht 5\' 9"  (1.753 m)   Wt 206 lb 2 oz (93.5 kg)   SpO2 (!) 86%   BMI 30.44 kg/m  General:   Well developed, NAD, BMI noted.  On oxygen HEENT:  Normocephalic . Face symmetric, atraumatic Lungs:  Decreased breath sounds, few end expiratory wheezes normal respiratory effort, no intercostal retractions, no accessory muscle use. Heart: Seems regular No pretibial edema bilaterally  Skin: Not pale. Not jaundice Neurologic:  alert & oriented X3.  Speech normal, gait appropriate for age and unassisted Psych--  Cognition and judgment appear intact.  Cooperative with normal attention span and concentration.  Behavior appropriate. No anxious or depressed appearing.      Assessment      Assessment  DM  HTN Hyperlipidemia GERD Anxiety- insomnia  BPH Cardiovascular:Dr Lovena Le  --  Cath x 2 [id number]: no significant CAD -- CHF, non-ischemic  --Atrial fibrillation; h/o AV node ablation, has a Pacemaker --h/o IC bleeding: Not anticoagulated --Peripheral vascular disease? Pulmonary: Dr Melvyn Novas, last OV 10-18-2014, f/u prn --COPD, severe, chronic resp failure, night O2 prn, has portable O2  --Pleural effusion s/p decortication --Pulmonary hypertension  Stasis dermatitis   PLAN: CHF: Seems to be doing great on Lasix 40 mg twice a day, Carvedilol, losartan, amlodipine.  Weight at the office today is 206 pounds, at home is typically around 200 on his a scale. No change, he is doing great. Last cardiology visit 05/24/2019.  Goal weight at the office is around 205 pounds. Chronic respiratory failure: On oxygen 24/7, typically 3 to 4 L, at home O2 sat decreased with activity but he is still able to do all his ADLs.  At rest O2 sat 94 to 95%. Insomnia: At this point is doing well with Tylenol PM. Preventive care: Flu shot today. RTC around 4 months.

## 2019-06-26 NOTE — Patient Instructions (Signed)
   GO TO THE FRONT DESK Schedule your next appointment   FOR A CHECK UP IN 4-5 MONTHS

## 2019-06-26 NOTE — Telephone Encounter (Signed)
Plan of care signed and faxed back to [facility] at [phone]. Form sent for scanning.  

## 2019-06-26 NOTE — Progress Notes (Signed)
Pre visit review using our clinic review tool, if applicable. No additional management support is needed unless otherwise documented below in the visit note. 

## 2019-06-27 NOTE — Assessment & Plan Note (Signed)
CHF: Seems to be doing great on Lasix 40 mg twice a day, Carvedilol, losartan, amlodipine.  Weight at the office today is 206 pounds, at home is typically around 200 on his a scale. No change, he is doing great. Last cardiology visit 05/24/2019.  Goal weight at the office is around 205 pounds. Chronic respiratory failure: On oxygen 24/7, typically 3 to 4 L, at home O2 sat decreased with activity but he is still able to do all his ADLs.  At rest O2 sat 94 to 95%. Insomnia: At this point is doing well with Tylenol PM. Preventive care: Flu shot today. RTC around 4 months.

## 2019-08-28 ENCOUNTER — Ambulatory Visit (INDEPENDENT_AMBULATORY_CARE_PROVIDER_SITE_OTHER): Payer: Medicare Other | Admitting: *Deleted

## 2019-08-28 DIAGNOSIS — I495 Sick sinus syndrome: Secondary | ICD-10-CM | POA: Diagnosis not present

## 2019-08-28 DIAGNOSIS — I428 Other cardiomyopathies: Secondary | ICD-10-CM

## 2019-08-29 ENCOUNTER — Other Ambulatory Visit: Payer: Self-pay

## 2019-08-29 ENCOUNTER — Encounter: Payer: Self-pay | Admitting: Cardiovascular Disease

## 2019-08-29 ENCOUNTER — Ambulatory Visit (INDEPENDENT_AMBULATORY_CARE_PROVIDER_SITE_OTHER): Payer: Medicare Other | Admitting: Cardiovascular Disease

## 2019-08-29 VITALS — BP 122/73 | HR 89 | Temp 97.9°F | Ht 69.0 in | Wt 204.0 lb

## 2019-08-29 DIAGNOSIS — I10 Essential (primary) hypertension: Secondary | ICD-10-CM | POA: Diagnosis not present

## 2019-08-29 DIAGNOSIS — I25810 Atherosclerosis of coronary artery bypass graft(s) without angina pectoris: Secondary | ICD-10-CM

## 2019-08-29 DIAGNOSIS — I5032 Chronic diastolic (congestive) heart failure: Secondary | ICD-10-CM

## 2019-08-29 DIAGNOSIS — I482 Chronic atrial fibrillation, unspecified: Secondary | ICD-10-CM | POA: Diagnosis not present

## 2019-08-29 LAB — CUP PACEART REMOTE DEVICE CHECK
Battery Remaining Longevity: 37 mo
Battery Voltage: 2.94 V
Brady Statistic AP VP Percent: 0 %
Brady Statistic AP VS Percent: 0 %
Brady Statistic AS VP Percent: 98.52 %
Brady Statistic AS VS Percent: 1.48 %
Brady Statistic RA Percent Paced: 0 %
Brady Statistic RV Percent Paced: 99.17 %
Date Time Interrogation Session: 20201117193122
Implantable Lead Implant Date: 20121128
Implantable Lead Implant Date: 20121128
Implantable Lead Location: 753858
Implantable Lead Location: 753860
Implantable Lead Model: 4194
Implantable Lead Model: 5076
Implantable Pulse Generator Implant Date: 20121128
Lead Channel Impedance Value: 342 Ohm
Lead Channel Impedance Value: 399 Ohm
Lead Channel Impedance Value: 4047 Ohm
Lead Channel Impedance Value: 4047 Ohm
Lead Channel Impedance Value: 418 Ohm
Lead Channel Impedance Value: 532 Ohm
Lead Channel Impedance Value: 551 Ohm
Lead Channel Impedance Value: 570 Ohm
Lead Channel Impedance Value: 589 Ohm
Lead Channel Pacing Threshold Amplitude: 0.875 V
Lead Channel Pacing Threshold Amplitude: 1.25 V
Lead Channel Pacing Threshold Pulse Width: 0.4 ms
Lead Channel Pacing Threshold Pulse Width: 0.4 ms
Lead Channel Sensing Intrinsic Amplitude: 5 mV
Lead Channel Sensing Intrinsic Amplitude: 5 mV
Lead Channel Setting Pacing Amplitude: 2 V
Lead Channel Setting Pacing Amplitude: 2.25 V
Lead Channel Setting Pacing Pulse Width: 0.4 ms
Lead Channel Setting Pacing Pulse Width: 0.4 ms
Lead Channel Setting Sensing Sensitivity: 4 mV

## 2019-08-29 NOTE — Progress Notes (Signed)
Cardiology Office Note   Date:  08/29/2019   ID:  Bradley Jock Sr., DOB 1940-12-30, MRN [id number]  PCP:  Colon Branch, MD  Cardiologist:   Skeet Latch, MD   No chief complaint on file.    History of Present Illness: Bradley PAULOS Sr. is a 78 y.o. male with chronic atrial fibrillation s/p AVN ablation and CRT-P, chronic diastolic heart failure, mild aortic stenosis, CAD, hypertension, hyperlipidemia, prior ICH (no anticoagulation), COPD, and PAD who presents for follow-up. He previously underwent left heart catheterization in February 07, 2004 and in 2017-02-06 and had no significant coronary artery disease. He had a BiV pacemaker and AV nodal ablation 09/08/11. At that time he had reduced systolic function in the setting of atrial fibrillation with RVR. After placement of the biventricular pacemaker his ejection fraction improved to 50-55% in Feb 07, 2016. He was admitted 02/2017 with COPD exacerbation and acute on chronic diastolic heart failure. BNP was 491 and TEE had pleural effusions on chest x-ray. He improved with diuresis. Echocardiogram at that time revealed LVEF 55-60%, mild mitral regurgitation, and PA SP 55 mmHg. He followed up with [name] on 03/09/17 and was doing well.  Since his last appointment Bradley [name] was admitted 12/2018 with a COPD exacerbation  He developed increasing shortness of breath and cough.  In the hospital he required BiPAP, IV steroids and antibiotics.  He was again admitted 04/2019 with acute respiratory failure due to both COPD exacerbation and acute on chronic diastolic heart failure.  She followed up with [name], PA-C on 05/2019 and his weight was stable at 206 pounds.  Since hospitalization he has been doing well.  He reports that his breathing is excellent.  His weight has been stable.  He checks it daily.  Uses oxygen all the time when at home and only with exertion when he goes out.  He is no longer walking his dogs anymore because his granddaughter is afraid he will  fall.  He does still try to walk for exercise and has no exertional chest pain or shortness of breath.  He denies lower extremity edema, orthopnea, or PND.  He has not had to take any extra doses of furosemide.  He is scheduled to see Dr. Larose Kells soon.  Bradley [name] has two great grandchildren who are his pride and joy.  His son was a [employer]ud[employer].  He died of a drug overdose in 2017-02-06.   Past Medical History:  Diagnosis Date   Abnormal CT scan, chest 02/07/2011   CT chest, several lymphadenopathies. Sees pulmonary   Anemia    intermittent   Arthritis    "?back" (09/19/2018)   Atrial fibrillation (HCC)    s/p AV node ablation & BiV PPM implantation 09/08/11 (op dictation pending)   BPH (benign prostatic hyperplasia)    Saw Dr Reece Agar 02/07/03, normal renal u/s   Bronchitis 08/26/2017   CAD (coronary artery disease)    CHF (congestive heart failure) ([place])    Thought primarily to be non-systolic although EF down (EF 45-50% 12/2010, down to 35-40% 09/05/11), cath 07-Feb-2007 with no CAD, nuclear study 07/2011 showing Small area of reversibility in the distal ant/lat wall the left ventricle suspicious for ischemia/septal wall HK but felt to be low risk  (per D/C Summary 07/2011)   Chronic bronchitis ([place])    "get it ~ q yr"   Heart murmur    HLD (hyperlipidemia)    HTN (hypertension)    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   Pleural effusion 2008   S/p decortication   Pulmonary HTN (HCC)    per cath 2008   Type II diabetes mellitus (HCC) 1999    Past Surgical History:  Procedure Laterality Date   BI-VENTRICULAR PACEMAKER INSERTION Left 09/08/2011   Procedure: BI-VENTRICULAR PACEMAKER INSERTION (CRT-P);  Surgeon: Marinus Maw, MD;  Location: [facility] CATH LAB;  Service: Cardiovascular;  Laterality: Left;    CARDIAC CATHETERIZATION     "couple times; never had balloon or stent" (09/19/2018)   CATARACT EXTRACTION W/ INTRAOCULAR LENS  IMPLANT, BILATERAL Bilateral 08/2018   COLONOSCOPY  03/10/11   normal   INSERT / REPLACE / REMOVE PACEMAKER  09/08/11   pacemaker placement   LUNG DECORTICATION     PLEURAL SCARIFICATION     pneumothorax with fibrothorax  ~ 2010   TONSILLECTOMY     "as a kid"      Current Outpatient Medications  Medication Sig Dispense Refill   acetaminophen (TYLENOL) 500 MG tablet Take 500 mg by mouth every 6 (six) hours as needed for headache.     amLODipine (NORVASC) 10 MG tablet Take 1 tablet (10 mg total) by mouth daily. 90 tablet 3   aspirin 81 MG tablet Take 81 mg by mouth every morning.      atorvastatin (LIPITOR) 20 MG tablet Take 1 tablet (20 mg total) by mouth at bedtime. 90 tablet 3   budesonide (PULMICORT) 0.5 MG/2ML nebulizer solution Take 2 mLs (0.5 mg total) by nebulization 2 (two) times daily for 30 days. 120 mL 0   calcium-vitamin D (OSCAL WITH D) 500-200 MG-UNIT tablet Take 1 tablet by mouth every morning.     carvedilol (COREG) 25 MG tablet Take 1 tablet (25 mg total) by mouth 2 (two) times daily with a meal. 180 tablet 3   diphenhydramine-acetaminophen (TYLENOL PM EXTRA STRENGTH) 25-500 MG TABS tablet Take 1 tablet by mouth at bedtime.     furosemide (LASIX) 40 MG tablet Take 1 tablet (40 mg total) by mouth 2 (two) times daily.     ipratropium-albuterol (DUONEB) 0.5-2.5 (3) MG/3ML SOLN Take 3 mLs by nebulization 2 (two) times daily as needed. 75 mL 0   Lancets (ONETOUCH ULTRASOFT) lancets Check blood sugars no more than twice daily 200 each 12   losartan (COZAAR) 100 MG tablet Take 0.5 tablets (50 mg total) by mouth at bedtime. 45 tablet 3   Multiple Vitamins-Minerals (MULTIVITAMINS THER. W/MINERALS) TABS tablet Take 1 tablet by mouth daily. 30 each 5   nitroGLYCERIN (NITROSTAT) 0.4 MG SL tablet Place 1 tablet (0.4 mg total) under the  tongue every 5 (five) minutes x 3 doses as needed for chest pain. 25 tablet 3   ONETOUCH VERIO test strip USE TO CHECK BLOOD SUGAR NO MORE THAN TWICE A DAY 200 each 12   pantoprazole (PROTONIX) 40 MG tablet Take 1 tablet (40 mg total) by mouth every morning. 90 tablet 3   sitaGLIPtin (JANUVIA) 100 MG tablet Take 1 tablet (100 mg total) by mouth daily. 90 tablet 1   tamsulosin (FLOMAX) 0.4 MG CAPS capsule Take 1 capsule (0.4 mg total) by mouth daily after supper. 90 capsule 3   No current facility-administered medications for this visit.     Allergies:  Hydrocodone, Tramadol, Tadalafil, and Avelox [moxifloxacin hydrochloride]    Social History:  The patient  reports that he quit smoking about 38 years ago. His smoking use included cigarettes and cigars. He has a 62.50 pack-year smoking history. He has never used smokeless tobacco. He reports previous alcohol use. He reports that he does not use drugs.   Family History:  The patient's family history includes Breast cancer in his maternal aunt; Coronary artery disease in his father; Diabetes in his father; Drug abuse in his son; Polycythemia in his mother.    ROS:  Please see the history of present illness.   Otherwise, review of systems are positive for none.   All other systems are reviewed and negative.    PHYSICAL EXAM: VS:  BP 122/73    Pulse 89    Temp 97.9 F (36.6 C)    Ht 5\' 9"  (1.753 m)    Wt 204 lb (92.5 kg)    SpO2 (!) 86%    BMI 30.13 kg/m  , BMI Body mass index is 30.13 kg/m. GENERAL:  Well appearing HEENT: Pupils equal round and reactive, fundi not visualized, oral mucosa unremarkable NECK:  No jugular venous distention, waveform within normal limits, carotid upstroke brisk and symmetric, no bruits LUNGS:  Clear to auscultation bilaterally HEART:  RRR.  PMI not displaced or sustained,S1 and S2 within normal limits, no S3, no S4, no clicks, no rubs, no murmurs ABD:  Flat, positive bowel sounds normal in frequency in  pitch, no bruits, no rebound, no guarding, no midline pulsatile mass, no hepatomegaly, no splenomegaly EXT:  2 plus pulses throughout, no edema, no cyanosis no clubbing SKIN:  No rashes no nodules NEURO:  Cranial nerves II through XII grossly intact, motor grossly intact throughout PSYCH:  Cognitively intact, oriented to person place and time   EKG:  EKG is not ordered today. The ekg ordered 05/26/17 demonstrates atrial fibrillation.  Ventricular paced at 75 bpm 08/26/17: VP.  Rate 82 bpm.    Echo 05/26/18: Study Conclusions  - Left ventricle: The cavity size was normal. Wall thickness was   normal. Systolic function was normal. The estimated ejection   fraction was in the range of 55% to 60%. Although no diagnostic   regional wall motion abnormality was identified, this possibility   cannot be completely excluded on the basis of this study. The   study was not technically sufficient to allow evaluation of LV   diastolic dysfunction due to atrial fibrillation. - Aortic valve: There was no stenosis. - Mitral valve: Moderately calcified annulus. There was trivial   regurgitation. - Left atrium: The atrium was mildly dilated. - Right ventricle: The cavity size was moderately dilated. Pacer   wire or catheter noted in right ventricle. Systolic function was   normal. - Right atrium: The atrium was severely dilated. - Tricuspid valve: Peak RV-RA gradient (S): 32 mm Hg. - Pulmonary arteries: PA peak pressure: 40 mm Hg (S). - Systemic veins: IVC measured 2.2 cm with > 50% respirophasic   variation, suggesting RA pressure 8 mmHg.  Impressions:  - The patient appeared to be in atrial fibrillation. Normal LV size   and systolic function, EF 55-60%. Moderately dilated RV with   normal systolic function. Mild pulmonary hypertension. Biatrial   enlargement. No significant valvular abnormalities.  Recent Labs: 12/21/2018: B Natriuretic Peptide 237.9 04/18/2019: ALT 11 05/09/2019: Magnesium  2.1 05/15/2019: BUN 11; Creatinine, Ser 0.75; Hemoglobin 13.3; Platelets 220.0; Potassium 5.2; Sodium 140  Lipid Panel    Component Value Date/Time   CHOL 105 12/12/2018 1409   TRIG 120.0 12/12/2018 1409   HDL 31.50 (L) 12/12/2018 1409   CHOLHDL 3 12/12/2018 1409   VLDL 24.0 12/12/2018 1409   LDLCALC 50 12/12/2018 1409      Wt Readings from Last 3 Encounters:  08/29/19 204 lb (92.5 kg)  06/26/19 206 lb 2 oz (93.5 kg)  05/24/19 206 lb (93.4 kg)      ASSESSMENT AND PLAN:  # Chronic diastolic heart failure:  # Hypertensive heart disesase: # Hypotension: Bradley [name] is euvolemic and doing well.  He is doing well on his current dose of lasix.  His breathing has nearly returned to baseline after his COPD exacerbation. Continue amlodipine, carvedilol, losartan, and furosemide.  # Longstanding Persistent Atrial fibrillation: S/p AV node ablation with CRT-P.  Asymptomatic. Continue carvedilol. Bradley [name] is not on anticoagulation due to prior intracranial bleed.He is tolerating aspirin.  # Chronic hypoxia:  Oxygen saturation was 86% on RA and 83% on repeat.  We discussed the fact that he should be using his oxygen at all times, not just at home.  Goal is >=88%.   Current medicines are reviewed at length with the patient today.  The patient does not have concerns regarding medicines.  The following changes have been made:  none  Labs/ tests ordered today include:  No orders of the defined types were placed in this encounter.    Disposition:   FU with Sayana Salley C. [name], MD, [facility] in 6 months.     Signed, Tiajuana Leppanen C. [name], MD, [facility]  08/29/2019 1:29 PM    [facility]

## 2019-08-29 NOTE — Patient Instructions (Signed)
Medication Instructions:  Your physician recommends that you continue on your current medications as directed. Please refer to the Current Medication list given to you today.  *If you need a refill on your cardiac medications before your next appointment, please call your pharmacy*  Lab Work: NONE  Testing/Procedures: NONE   Follow-Up: At [facility], you and your health needs are our priority.  As part of our continuing mission to provide you with exceptional heart care, we have created designated Provider Care Teams.  These Care Teams include your primary Cardiologist (physician) and Advanced Practice Providers (APPs -  Physician Assistants and Nurse Practitioners) who all work together to provide you with the care you need, when you need it.  Your next appointment:   6 month(s)  You will receive a reminder letter in the mail two months in advance. If you don't receive a letter, please call our office to schedule the follow-up appointment.  The format for your next appointment:   Either In Person or Virtual  Provider:   You may see Tiffany [place], MD or one of the following Advanced Practice Providers on your designated Care Team:    Luke Kilroy, PA-C  Callie Goodrich, PA-C  Jesse Cleaver, FNP    

## 2019-09-13 ENCOUNTER — Encounter (HOSPITAL_COMMUNITY): Payer: Self-pay

## 2019-09-13 ENCOUNTER — Emergency Department (HOSPITAL_COMMUNITY): Payer: Medicare Other

## 2019-09-13 ENCOUNTER — Other Ambulatory Visit: Payer: Self-pay

## 2019-09-13 ENCOUNTER — Inpatient Hospital Stay (HOSPITAL_COMMUNITY)
Admission: EM | Admit: 2019-09-13 | Discharge: 2019-09-19 | DRG: 193 | Disposition: A | Discharge status: Home / Self Care | Payer: Medicare Other | Attending: Family Medicine | Admitting: Family Medicine

## 2019-09-13 DIAGNOSIS — J9601 Acute respiratory failure with hypoxia: Secondary | ICD-10-CM | POA: Diagnosis present

## 2019-09-13 DIAGNOSIS — I4821 Permanent atrial fibrillation: Secondary | ICD-10-CM | POA: Diagnosis present

## 2019-09-13 DIAGNOSIS — Z803 Family history of malignant neoplasm of breast: Secondary | ICD-10-CM

## 2019-09-13 DIAGNOSIS — I428 Other cardiomyopathies: Secondary | ICD-10-CM

## 2019-09-13 DIAGNOSIS — J189 Pneumonia, unspecified organism: Secondary | ICD-10-CM | POA: Diagnosis present

## 2019-09-13 DIAGNOSIS — N400 Enlarged prostate without lower urinary tract symptoms: Secondary | ICD-10-CM | POA: Diagnosis present

## 2019-09-13 DIAGNOSIS — I495 Sick sinus syndrome: Secondary | ICD-10-CM | POA: Diagnosis present

## 2019-09-13 DIAGNOSIS — I272 Pulmonary hypertension, unspecified: Secondary | ICD-10-CM | POA: Diagnosis present

## 2019-09-13 DIAGNOSIS — J441 Chronic obstructive pulmonary disease with (acute) exacerbation: Secondary | ICD-10-CM | POA: Diagnosis present

## 2019-09-13 DIAGNOSIS — Z961 Presence of intraocular lens: Secondary | ICD-10-CM | POA: Diagnosis present

## 2019-09-13 DIAGNOSIS — Z888 Allergy status to other drugs, medicaments and biological substances status: Secondary | ICD-10-CM

## 2019-09-13 DIAGNOSIS — I5032 Chronic diastolic (congestive) heart failure: Secondary | ICD-10-CM | POA: Diagnosis present

## 2019-09-13 DIAGNOSIS — E1169 Type 2 diabetes mellitus with other specified complication: Secondary | ICD-10-CM | POA: Diagnosis present

## 2019-09-13 DIAGNOSIS — I110 Hypertensive heart disease with heart failure: Secondary | ICD-10-CM | POA: Diagnosis present

## 2019-09-13 DIAGNOSIS — J440 Chronic obstructive pulmonary disease with acute lower respiratory infection: Secondary | ICD-10-CM | POA: Diagnosis present

## 2019-09-13 DIAGNOSIS — I2510 Atherosclerotic heart disease of native coronary artery without angina pectoris: Secondary | ICD-10-CM | POA: Diagnosis present

## 2019-09-13 DIAGNOSIS — I493 Ventricular premature depolarization: Secondary | ICD-10-CM | POA: Diagnosis present

## 2019-09-13 DIAGNOSIS — I480 Paroxysmal atrial fibrillation: Secondary | ICD-10-CM | POA: Diagnosis not present

## 2019-09-13 DIAGNOSIS — Z7982 Long term (current) use of aspirin: Secondary | ICD-10-CM

## 2019-09-13 DIAGNOSIS — Z8673 Personal history of transient ischemic attack (TIA), and cerebral infarction without residual deficits: Secondary | ICD-10-CM | POA: Diagnosis not present

## 2019-09-13 DIAGNOSIS — Z9842 Cataract extraction status, left eye: Secondary | ICD-10-CM | POA: Diagnosis not present

## 2019-09-13 DIAGNOSIS — Z885 Allergy status to narcotic agent status: Secondary | ICD-10-CM

## 2019-09-13 DIAGNOSIS — E118 Type 2 diabetes mellitus with unspecified complications: Secondary | ICD-10-CM | POA: Diagnosis not present

## 2019-09-13 DIAGNOSIS — Z8249 Family history of ischemic heart disease and other diseases of the circulatory system: Secondary | ICD-10-CM

## 2019-09-13 DIAGNOSIS — I482 Chronic atrial fibrillation, unspecified: Secondary | ICD-10-CM | POA: Diagnosis present

## 2019-09-13 DIAGNOSIS — Z9841 Cataract extraction status, right eye: Secondary | ICD-10-CM

## 2019-09-13 DIAGNOSIS — E1165 Type 2 diabetes mellitus with hyperglycemia: Secondary | ICD-10-CM | POA: Diagnosis present

## 2019-09-13 DIAGNOSIS — Z950 Presence of cardiac pacemaker: Secondary | ICD-10-CM | POA: Diagnosis present

## 2019-09-13 DIAGNOSIS — Z7984 Long term (current) use of oral hypoglycemic drugs: Secondary | ICD-10-CM

## 2019-09-13 DIAGNOSIS — I10 Essential (primary) hypertension: Secondary | ICD-10-CM | POA: Diagnosis not present

## 2019-09-13 DIAGNOSIS — E785 Hyperlipidemia, unspecified: Secondary | ICD-10-CM | POA: Diagnosis present

## 2019-09-13 DIAGNOSIS — Z20828 Contact with and (suspected) exposure to other viral communicable diseases: Secondary | ICD-10-CM | POA: Diagnosis present

## 2019-09-13 DIAGNOSIS — E1151 Type 2 diabetes mellitus with diabetic peripheral angiopathy without gangrene: Secondary | ICD-10-CM | POA: Diagnosis present

## 2019-09-13 DIAGNOSIS — Z9981 Dependence on supplemental oxygen: Secondary | ICD-10-CM

## 2019-09-13 DIAGNOSIS — J9600 Acute respiratory failure, unspecified whether with hypoxia or hypercapnia: Secondary | ICD-10-CM | POA: Diagnosis present

## 2019-09-13 DIAGNOSIS — I4891 Unspecified atrial fibrillation: Secondary | ICD-10-CM | POA: Diagnosis present

## 2019-09-13 DIAGNOSIS — E782 Mixed hyperlipidemia: Secondary | ICD-10-CM | POA: Diagnosis present

## 2019-09-13 DIAGNOSIS — I739 Peripheral vascular disease, unspecified: Secondary | ICD-10-CM | POA: Diagnosis present

## 2019-09-13 DIAGNOSIS — Z87891 Personal history of nicotine dependence: Secondary | ICD-10-CM

## 2019-09-13 DIAGNOSIS — Z833 Family history of diabetes mellitus: Secondary | ICD-10-CM

## 2019-09-13 DIAGNOSIS — Z79899 Other long term (current) drug therapy: Secondary | ICD-10-CM

## 2019-09-13 DIAGNOSIS — E119 Type 2 diabetes mellitus without complications: Secondary | ICD-10-CM

## 2019-09-13 LAB — COMPREHENSIVE METABOLIC PANEL
ALT: 16 U/L (ref 0–44)
AST: 27 U/L (ref 15–41)
Albumin: 3.5 g/dL (ref 3.5–5.0)
Alkaline Phosphatase: 91 U/L (ref 38–126)
Anion gap: 9 (ref 5–15)
BUN: 12 mg/dL (ref 8–23)
CO2: 30 mmol/L (ref 22–32)
Calcium: 8.3 mg/dL — ABNORMAL LOW (ref 8.9–10.3)
Chloride: 99 mmol/L (ref 98–111)
Creatinine, Ser: 0.85 mg/dL (ref 0.61–1.24)
GFR calc Af Amer: >60 mL/min (ref >60)
GFR calc non Af Amer: >60 mL/min (ref >60)
Glucose, Bld: 128 mg/dL — ABNORMAL HIGH (ref 70–99)
Potassium: 4.4 mmol/L (ref 3.5–5.1)
Sodium: 138 mmol/L (ref 135–145)
Total Bilirubin: 0.8 mg/dL (ref 0.3–1.2)
Total Protein: 6.7 g/dL (ref 6.5–8.1)

## 2019-09-13 LAB — CBC WITH DIFFERENTIAL/PLATELET
Abs Immature Granulocytes: 0.03 10*3/uL (ref 0.00–0.07)
Basophils Absolute: 0 10*3/uL (ref 0.0–0.1)
Basophils Relative: 0 %
Eosinophils Absolute: 0.1 10*3/uL (ref 0.0–0.5)
Eosinophils Relative: 1 %
HCT: 41.4 % (ref 39.0–52.0)
Hemoglobin: 13.7 g/dL (ref 13.0–17.0)
Immature Granulocytes: 0 %
Lymphocytes Relative: 16 %
Lymphs Abs: 1.3 10*3/uL (ref 0.7–4.0)
MCH: 30.9 pg (ref 26.0–34.0)
MCHC: 33.1 g/dL (ref 30.0–36.0)
MCV: 93.2 fL (ref 80.0–100.0)
Monocytes Absolute: 0.5 10*3/uL (ref 0.1–1.0)
Monocytes Relative: 6 %
Neutro Abs: 6.3 10*3/uL (ref 1.7–7.7)
Neutrophils Relative %: 77 %
Platelets: 190 10*3/uL (ref 150–400)
RBC: 4.44 MIL/uL (ref 4.22–5.81)
RDW: 13.2 % (ref 11.5–15.5)
WBC: 8.2 10*3/uL (ref 4.0–10.5)
nRBC: 0 % (ref 0.0–0.2)

## 2019-09-13 LAB — CBC
HCT: 41.9 % (ref 39.0–52.0)
Hemoglobin: 14.2 g/dL (ref 13.0–17.0)
MCH: 30.6 pg (ref 26.0–34.0)
MCHC: 33.9 g/dL (ref 30.0–36.0)
MCV: 90.3 fL (ref 80.0–100.0)
Platelets: 182 10*3/uL (ref 150–400)
RBC: 4.64 MIL/uL (ref 4.22–5.81)
RDW: 13.2 % (ref 11.5–15.5)
WBC: 10.1 10*3/uL (ref 4.0–10.5)
nRBC: 0 % (ref 0.0–0.2)

## 2019-09-13 LAB — TROPONIN I (HIGH SENSITIVITY)
Troponin I (High Sensitivity): 5 ng/L (ref <18)
Troponin I (High Sensitivity): 4 ng/L (ref <18)

## 2019-09-13 LAB — BRAIN NATRIURETIC PEPTIDE: B Natriuretic Peptide: 165.6 pg/mL — ABNORMAL HIGH (ref 0.0–100.0)

## 2019-09-13 LAB — POC SARS CORONAVIRUS 2 AG -  ED: SARS Coronavirus 2 Ag: NEGATIVE

## 2019-09-13 MED ORDER — TAMSULOSIN HCL 0.4 MG PO CAPS
0.4000 mg | ORAL_CAPSULE | Freq: Every day | ORAL | Status: DC
  Administered 2019-09-14 – 2019-09-18 (×5): 0.4 mg via ORAL
  Filled 2019-09-13 (×5): qty 1

## 2019-09-13 MED ORDER — NITROGLYCERIN 0.4 MG SL SUBL: 0.4000 mg | SUBLINGUAL_TABLET | SUBLINGUAL | Status: DC | PRN

## 2019-09-13 MED ORDER — SODIUM CHLORIDE 0.9 % IV SOLN
500.0000 mg | INTRAVENOUS | Status: DC
  Administered 2019-09-14 – 2019-09-16 (×3): 500 mg via INTRAVENOUS
  Filled 2019-09-13 (×4): qty 500

## 2019-09-13 MED ORDER — LOSARTAN POTASSIUM 50 MG PO TABS
50.0000 mg | ORAL_TABLET | Freq: Every day | ORAL | Status: DC
  Administered 2019-09-13 – 2019-09-18 (×6): 50 mg via ORAL
  Filled 2019-09-13 (×6): qty 1

## 2019-09-13 MED ORDER — AMLODIPINE BESYLATE 10 MG PO TABS
10.0000 mg | ORAL_TABLET | Freq: Every day | ORAL | Status: DC
  Administered 2019-09-14 – 2019-09-19 (×6): 10 mg via ORAL
  Filled 2019-09-13: qty 1
  Filled 2019-09-13: qty 2
  Filled 2019-09-13 (×4): qty 1

## 2019-09-13 MED ORDER — ONDANSETRON HCL 4 MG PO TABS: 4.0000 mg | ORAL_TABLET | Freq: Four times a day (QID) | ORAL | Status: DC | PRN

## 2019-09-13 MED ORDER — CARVEDILOL 25 MG PO TABS
25.0000 mg | ORAL_TABLET | Freq: Two times a day (BID) | ORAL | Status: DC
  Administered 2019-09-14 – 2019-09-19 (×11): 25 mg via ORAL
  Filled 2019-09-13 (×9): qty 1
  Filled 2019-09-13: qty 2
  Filled 2019-09-13: qty 1

## 2019-09-13 MED ORDER — DIPHENHYDRAMINE-APAP (SLEEP) 25-500 MG PO TABS: 1.0000 | ORAL_TABLET | Freq: Every day | ORAL | Status: DC

## 2019-09-13 MED ORDER — IOHEXOL 350 MG/ML SOLN
75.0000 mL | Freq: Once | INTRAVENOUS | Status: AC | PRN
  Administered 2019-09-13: 20:00:00 75 mL via INTRAVENOUS

## 2019-09-13 MED ORDER — ENOXAPARIN SODIUM 40 MG/0.4ML ~~LOC~~ SOLN
40.0000 mg | SUBCUTANEOUS | Status: DC
  Administered 2019-09-14 – 2019-09-18 (×6): 40 mg via SUBCUTANEOUS
  Filled 2019-09-13 (×6): qty 0.4

## 2019-09-13 MED ORDER — FUROSEMIDE 40 MG PO TABS
40.0000 mg | ORAL_TABLET | Freq: Two times a day (BID) | ORAL | Status: DC
  Administered 2019-09-14 – 2019-09-17 (×7): 40 mg via ORAL
  Filled 2019-09-13 (×6): qty 1
  Filled 2019-09-13: qty 2

## 2019-09-13 MED ORDER — ALBUTEROL SULFATE HFA 108 (90 BASE) MCG/ACT IN AERS: 8.0000 | INHALATION_SPRAY | Freq: Once | RESPIRATORY_TRACT | Status: DC

## 2019-09-13 MED ORDER — IPRATROPIUM BROMIDE HFA 17 MCG/ACT IN AERS
4.0000 | INHALATION_SPRAY | Freq: Once | RESPIRATORY_TRACT | Status: AC
  Administered 2019-09-13: 20:00:00 4 via RESPIRATORY_TRACT
  Filled 2019-09-13: qty 12.9

## 2019-09-13 MED ORDER — ALBUTEROL SULFATE (2.5 MG/3ML) 0.083% IN NEBU
3.0000 mL | INHALATION_SOLUTION | RESPIRATORY_TRACT | Status: DC
  Administered 2019-09-14 (×2): 3 mL via RESPIRATORY_TRACT
  Filled 2019-09-13 (×2): qty 3

## 2019-09-13 MED ORDER — PANTOPRAZOLE SODIUM 40 MG PO TBEC
40.0000 mg | DELAYED_RELEASE_TABLET | Freq: Every morning | ORAL | Status: DC
  Administered 2019-09-14 – 2019-09-19 (×6): 40 mg via ORAL
  Filled 2019-09-13 (×6): qty 1

## 2019-09-13 MED ORDER — CALCIUM CARBONATE-VITAMIN D 500-200 MG-UNIT PO TABS
1.0000 | ORAL_TABLET | Freq: Every morning | ORAL | Status: DC
  Administered 2019-09-14 – 2019-09-19 (×6): 1 via ORAL
  Filled 2019-09-13 (×7): qty 1

## 2019-09-13 MED ORDER — ACETAMINOPHEN 500 MG PO TABS
500.0000 mg | ORAL_TABLET | Freq: Every day | ORAL | Status: DC
  Administered 2019-09-13 – 2019-09-18 (×6): 500 mg via ORAL
  Filled 2019-09-13 (×6): qty 1

## 2019-09-13 MED ORDER — ATORVASTATIN CALCIUM 10 MG PO TABS
20.0000 mg | ORAL_TABLET | Freq: Every day | ORAL | Status: DC
  Administered 2019-09-13 – 2019-09-18 (×6): 20 mg via ORAL
  Filled 2019-09-13 (×6): qty 2

## 2019-09-13 MED ORDER — ADULT MULTIVITAMIN W/MINERALS CH
1.0000 | ORAL_TABLET | Freq: Every day | ORAL | Status: DC
  Administered 2019-09-14 – 2019-09-19 (×6): 1 via ORAL
  Filled 2019-09-13 (×6): qty 1

## 2019-09-13 MED ORDER — INSULIN ASPART 100 UNIT/ML ~~LOC~~ SOLN
0.0000 [IU] | Freq: Three times a day (TID) | SUBCUTANEOUS | Status: DC
  Administered 2019-09-14: 2 [IU] via SUBCUTANEOUS
  Administered 2019-09-14 – 2019-09-15 (×3): 3 [IU] via SUBCUTANEOUS
  Administered 2019-09-15: 5 [IU] via SUBCUTANEOUS
  Administered 2019-09-16 (×2): 3 [IU] via SUBCUTANEOUS
  Administered 2019-09-16: 5 [IU] via SUBCUTANEOUS
  Administered 2019-09-17: 2 [IU] via SUBCUTANEOUS
  Administered 2019-09-17: 7 [IU] via SUBCUTANEOUS
  Administered 2019-09-17: 5 [IU] via SUBCUTANEOUS
  Administered 2019-09-18 (×2): 3 [IU] via SUBCUTANEOUS
  Administered 2019-09-18: 7 [IU] via SUBCUTANEOUS
  Administered 2019-09-19: 2 [IU] via SUBCUTANEOUS

## 2019-09-13 MED ORDER — SODIUM CHLORIDE 0.9 % IV SOLN
2.0000 g | INTRAVENOUS | Status: AC
  Administered 2019-09-14 – 2019-09-18 (×5): 2 g via INTRAVENOUS
  Filled 2019-09-13 (×5): qty 2

## 2019-09-13 MED ORDER — ASPIRIN EC 81 MG PO TBEC
81.0000 mg | DELAYED_RELEASE_TABLET | Freq: Every morning | ORAL | Status: DC
  Administered 2019-09-14 – 2019-09-19 (×6): 81 mg via ORAL
  Filled 2019-09-13 (×6): qty 1

## 2019-09-13 MED ORDER — ACETAMINOPHEN 650 MG RE SUPP: 650.0000 mg | Freq: Four times a day (QID) | RECTAL | Status: DC | PRN

## 2019-09-13 MED ORDER — ONDANSETRON HCL 4 MG/2ML IJ SOLN: 4.0000 mg | Freq: Four times a day (QID) | INTRAMUSCULAR | Status: DC | PRN

## 2019-09-13 MED ORDER — SODIUM CHLORIDE 0.9 % IV SOLN
1.0000 g | Freq: Once | INTRAVENOUS | Status: AC
  Administered 2019-09-13: 21:00:00 1 g via INTRAVENOUS
  Filled 2019-09-13: qty 10

## 2019-09-13 MED ORDER — AZITHROMYCIN 250 MG PO TABS
500.0000 mg | ORAL_TABLET | Freq: Once | ORAL | Status: AC
  Administered 2019-09-13: 21:00:00 500 mg via ORAL
  Filled 2019-09-13: qty 2

## 2019-09-13 MED ORDER — ALBUTEROL SULFATE HFA 108 (90 BASE) MCG/ACT IN AERS
8.0000 | INHALATION_SPRAY | Freq: Once | RESPIRATORY_TRACT | Status: AC
  Administered 2019-09-13: 20:00:00 8 via RESPIRATORY_TRACT
  Filled 2019-09-13: qty 6.7

## 2019-09-13 MED ORDER — ALBUTEROL SULFATE HFA 108 (90 BASE) MCG/ACT IN AERS
8.0000 | INHALATION_SPRAY | Freq: Once | RESPIRATORY_TRACT | Status: AC
  Administered 2019-09-13: 18:00:00 8 via RESPIRATORY_TRACT
  Filled 2019-09-13: qty 6.7

## 2019-09-13 MED ORDER — DIPHENHYDRAMINE HCL 25 MG PO CAPS
25.0000 mg | ORAL_CAPSULE | Freq: Every day | ORAL | Status: DC
  Administered 2019-09-13 – 2019-09-18 (×6): 25 mg via ORAL
  Filled 2019-09-13 (×6): qty 1

## 2019-09-13 MED ORDER — ACETAMINOPHEN 325 MG PO TABS: 650.0000 mg | ORAL_TABLET | Freq: Four times a day (QID) | ORAL | Status: DC | PRN

## 2019-09-13 NOTE — ED Triage Notes (Signed)
Pt from home via ems; c/o sob, cough x 2 days; EMS states pt has hx of "some kind of pulmonary issue"; wears 2L all the time; 87% on 2L, increased to 6L, 95%, denies pain; 125 solumedrol given PTA, mild wheezing with ems, alert and oriented, able to speak in full sentences; denies sick contacts  99.40F 124/80 HR 70  RR 24 95% 6L

## 2019-09-13 NOTE — Progress Notes (Deleted)
Subjective:   Bradley Searles Sr. is a 78 y.o. male who presents for Medicare Annual/Subsequent preventive examination.  Review of Systems:   Home Safety/Smoke Alarms: Feels safe in home. Smoke alarms in place.  Lives w/ wife in 3 story home. Granddaughter and great granddaughter live w/ them. Has stair lift and walk-in shower.   Male:   CCS-  2012-normal   PSA-  Lab Results  Component Value Date   PSA 3.60 07/15/2015   PSA 3.50 01/14/2014   PSA 1.79 12/14/2010       Objective:    Vitals: There were no vitals taken for this visit.  There is no height or weight on file to calculate BMI.  Advanced Directives 05/07/2019 05/07/2019 12/22/2018 12/21/2018 09/19/2018 09/19/2018 09/11/2018  Does Patient Have a Medical Advance Directive? Yes Yes Yes Yes Yes Yes No  Type of Advance Directive Living will - Healthcare Power of [place];Living will Healthcare Power of [place];Living will Living will Living will -  Does patient want to make changes to medical advance directive? No - Patient declined - No - Patient declined - No - Patient declined - -  Copy of Healthcare Power of Attorney in Chart? - - - - - - -  Would patient like information on creating a medical advance directive? - - - - - - Yes (MAU/Ambulatory/Procedural Areas - Information given)  Pre-existing out of facility DNR order (yellow form or pink MOST form) - - - - - - -    Tobacco Social History   Tobacco Use  Smoking Status Former Smoker  . Packs/day: 2.50  . Years: 25.00  . Pack years: 62.50  . Types: Cigarettes, Cigars  . Quit date: 10/11/1980  . Years since quitting: 38.9  Smokeless Tobacco Never Used     Counseling given: Not Answered   Clinical Intake:                       Past Medical History:  Diagnosis Date  . Abnormal CT scan, chest 2012   CT chest, several lymphadenopathies. Sees pulmonary  . Anemia    intermittent  . Arthritis    "?back" (09/19/2018)  . Atrial fibrillation (HCC)    s/p AV node ablation & BiV PPM implantation 09/08/11 (op dictation pending)  . BPH (benign prostatic hyperplasia)    Saw Dr [name] 2004, normal renal u/s  . Bronchitis 08/26/2017  . CAD (coronary artery disease)   . CHF (congestive heart failure) (HCC)    Thought primarily to be non-systolic although EF down (EF 70-62% 12/2010, down to 35-40% 09/05/11), cath 2008 with no CAD, nuclear study 07/2011 showing Small area of reversibility in the distal ant/lat wall the left ventricle suspicious for ischemia/septal wall HK but felt to be low risk  (per D/C Summary 07/2011)  . Chronic bronchitis (HCC)    "get it ~ q yr"  . Heart murmur   . HLD (hyperlipidemia)   . HTN (hypertension)   . 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 Insomnia   . Migraines    "very very rare"  . On home oxygen therapy    "2L; 24/7" (09/19/2018)  . Pacemaker   . Peripheral vascular disease (HCC)    ??  . Pleural effusion 2008   S/p decortication  . Pulmonary HTN (HCC)    per cath 2008  . Type II diabetes mellitus (HCC) 1999   Past Surgical History:  Procedure Laterality Date  .  BI-VENTRICULAR PACEMAKER INSERTION Left 09/08/2011   Procedure: BI-VENTRICULAR PACEMAKER INSERTION (CRT-P);  Surgeon: Marinus Maw, MD;  Location: [facility] C[facility];  Service: Cardiovascular;  Laterality: Left;  . CARDIAC CATHETERIZATION     "couple times; never had balloon or stent" (09/19/2018)  . CATARACT EXTRACTION W/ INTRAOCULAR LENS  IMPLANT, BILATERAL Bilateral 08/2018  . COLONOSCOPY  03/10/11   normal  . INSERT / REPLACE / REMOVE PACEMAKER  09/08/11   pacemaker placement  . LUNG DECORTICATION    . PLEURAL SCARIFICATION    . pneumothorax with fibrothorax  ~ 2010  . TONSILLECTOMY     "as a kid"    Family History  Problem Relation Age of Onset  . Diabetes Father   . Coronary artery disease Father   . Polycythemia Mother   . Drug abuse Son   . Breast cancer Maternal Aunt   . Prostate cancer Neg  Hx   . Colon cancer Neg Hx    Social History   Socioeconomic History  . Marital status: Married    Spouse name: [name]  . Number of children: 1  . Years of education: Not on file  . Highest education level: Not on file  Occupational History  . Occupation: retired Publishing rights manager: RETIRED  Social Needs  . Financial resource strain: Not hard at all  . Food insecurity    Worry: Never true    Inability: Never true  . Transportation needs    Medical: No    Non-medical: No  Tobacco Use  . Smoking status: Former Smoker    Packs/day: 2.50    Years: 25.00    Pack years: 62.50    Types: Cigarettes, Cigars    Quit date: 10/11/1980    Years since quitting: 38.9  . Smokeless tobacco: Never Used  Substance and Sexual Activity  . Alcohol use: Not Currently    Comment: 1 can of beer ever 4-5 months  . Drug use: No  . Sexual activity: Not Currently  Lifestyle  . Physical activity    Days per week: 6 days    Minutes per session: 20 min  . Stress: Not at all  Relationships  . Social connections    Talks on phone: More than three times a week    Gets together: More than three times a week    Attends religious service: More than 4 times per year    Active member of club or organization: No    Attends meetings of clubs or organizations: Never    Relationship status: Married  Other Topics Concern  . Not on file  Social History Narrative   Moved from [place] 2003.Marland Kitchen   He lives w/ his wife, [name]   1 son, substance abuse, passed away 10/10/2016          Outpatient Encounter Medications as of 09/14/2019  Medication Sig  . acetaminophen (TYLENOL) 500 MG tablet Take 500 mg by mouth every 6 (six) hours as needed for headache.  Marland Kitchen amLODipine (NORVASC) 10 MG tablet Take 1 tablet (10 mg total) by mouth daily.  Marland Kitchen aspirin 81 MG tablet Take 81 mg by mouth every morning.   Marland Kitchen atorvastatin (LIPITOR) 20 MG tablet Take 1 tablet (20 mg total) by mouth at bedtime.  . budesonide  (PULMICORT) 0.5 MG/2ML nebulizer solution Take 2 mLs (0.5 mg total) by nebulization 2 (two) times daily for 30 days.  . calcium-vitamin D (OSCAL WITH D) 500-200 MG-UNIT tablet Take 1 tablet by  mouth every morning.  . carvedilol (COREG) 25 MG tablet Take 1 tablet (25 mg total) by mouth 2 (two) times daily with a meal.  . diphenhydramine-acetaminophen (TYLENOL PM EXTRA STRENGTH) 25-500 MG TABS tablet Take 1 tablet by mouth at bedtime.  . furosemide (LASIX) 40 MG tablet Take 1 tablet (40 mg total) by mouth 2 (two) times daily.  Marland Kitchen ipratropium-albuterol (DUONEB) 0.5-2.5 (3) MG/3ML SOLN Take 3 mLs by nebulization 2 (two) times daily as needed.  . Lancets (ONETOUCH ULTRASOFT) lancets Check blood sugars no more than twice daily  . losartan (COZAAR) 100 MG tablet Take 0.5 tablets (50 mg total) by mouth at bedtime.  . Multiple Vitamins-Minerals (MULTIVITAMINS THER. W/MINERALS) TABS tablet Take 1 tablet by mouth daily.  . nitroGLYCERIN (NITROSTAT) 0.4 MG SL tablet Place 1 tablet (0.4 mg total) under the tongue every 5 (five) minutes x 3 doses as needed for chest pain.  Glory Rosebush VERIO test strip USE TO CHECK BLOOD SUGAR NO MORE THAN TWICE A DAY  . pantoprazole (PROTONIX) 40 MG tablet Take 1 tablet (40 mg total) by mouth every morning.  . sitaGLIPtin (JANUVIA) 100 MG tablet Take 1 tablet (100 mg total) by mouth daily.  . tamsulosin (FLOMAX) 0.4 MG CAPS capsule Take 1 capsule (0.4 mg total) by mouth daily after supper.   No facility-administered encounter medications on file as of 09/14/2019.     Activities of Daily Living In your present state of health, do you have any difficulty performing the following activities: 05/07/2019 12/22/2018  Hearing? N Y  Thomas? N Y  Difficulty concentrating or making decisions? N N  Walking or climbing stairs? Y Y  Comment - -  Dressing or bathing? N N  Doing errands, shopping? N N  Some recent data might be hidden    Patient Care Team: Colon Branch, MD  as PCP - General (Internal Medicine) Skeet Latch, MD as PCP - Cardiology (Cardiology) Colon Branch, MD as Referring Physician (Internal Medicine) Evans Lance, MD as Consulting Physician (Cardiology) Tanda Rockers, MD as Consulting Physician (Pulmonary Disease) Ruby Cola, MD as Consulting Physician (Otolaryngology) Joseph Art, OD (Optometry)   Assessment:   This is a routine wellness examination for Bradley [name]. Physical assessment deferred to PCP.  Exercise Activities and Dietary recommendations   Diet (meal preparation, eat out, water intake, caffeinated beverages, dairy products, fruits and vegetables): {Desc; diets:16563} Breakfast: Lunch:  Dinner:      Goals    . Maintain current health (pt-stated)       Fall Risk Fall Risk  09/11/2018 09/07/2017 08/26/2016 03/26/2016 03/12/2015  Falls in the past year? 0 No Yes Yes No  Number falls in past yr: - - 1 1 -  Injury with Fall? - - Yes Yes -  Follow up - - Falls evaluation completed Falls evaluation completed -   Depression Screen PHQ 2/9 Scores 09/11/2018 05/25/2018 09/07/2017 08/26/2016  PHQ - 2 Score 0 0 0 0    Cognitive Function Ad8 score reviewed for issues:  Issues making decisions:  Less interest in hobbies / activities:  Repeats questions, stories (family complaining):  Trouble using ordinary gadgets (microwave, computer, phone):  Forgets the month or year:   Mismanaging finances:   Remembering appts:  Daily problems with thinking and/or memory: Ad8 score is=     MMSE - Mini Mental State Exam 09/07/2017  Not completed: Refused        Immunization History  Administered Date(s) Administered  .  Fluad Quad(high Dose 65+) 06/26/2019  . Influenza Split 10/01/2011  . Influenza Whole 08/14/2007, 07/11/2009, 09/14/2010  . Influenza, High Dose Seasonal PF 09/14/2013, 07/15/2015, 07/09/2016, 07/05/2017, 08/03/2018  . Influenza,inj,Quad PF,6+ Mos 07/05/2014  . Influenza-Unspecified 08/11/2012   . Pneumococcal Conjugate-13 03/12/2015  . Pneumococcal Polysaccharide-23 07/11/2004, 03/30/2009, 11/01/2017, 05/11/2019  . Td 03/12/2003  . Tetanus 09/14/2013  . Zoster 04/20/2011    Screening Tests Health Maintenance  Topic Date Due  . FOOT EXAM  11/01/2018  . HEMOGLOBIN A1C  06/14/2019  . OPHTHALMOLOGY EXAM  12/11/2019  . TETANUS/TDAP  09/15/2023  . INFLUENZA VACCINE  Completed  . PNA vac Low Risk Adult  Completed     Plan:   ***  I have personally reviewed and noted the following in the patient's chart:   . Medical and social history . Use of alcohol, tobacco or illicit drugs  . Current medications and supplements . Functional ability and status . Nutritional status . Physical activity . Advanced directives . List of other physicians . Hospitalizations, surgeries, and ER visits in previous 12 months . Vitals . Screenings to include cognitive, depression, and falls . Referrals and appointments  In addition, I have reviewed and discussed with patient certain preventive protocols, quality metrics, and best practice recommendations. A written personalized care plan for preventive services as well as general preventive health recommendations were provided to patient.     Avon GullyBritt, Curtis Uriarte Angel, [place]RN  09/13/2019

## 2019-09-13 NOTE — H&P (Signed)
History and Physical    Bradley KIRN Sr. XTK:[id number] DOB: [birth year] DOA: 09/13/2019  PCP: [name], MD  Patient coming from: Home.  Chief Complaint: Shortness of breath.  HPI: Bradley BRUNSWICK Sr. is a 78 y.o. male with history of atrial fibrillation status post AV nodal ablation and pacemaker placement, diastolic CHF, COPD, CAD and history of intracranial hemorrhage not on anticoagulation for A. fib presents to the ER because of increasing shortness of breath.  Patient states he has been having increasing wheezing and shortness of breath last 2 days.  Has been a nonproductive cough with chest pain on coughing.  Denies any fever chills or sick contacts.  ED Course: In the ER patient was requiring 4 L oxygen to maintain sats usually uses 2 L.  Was wheezing bilaterally on exam.  CT angiogram of the chest shows lingular pneumonia otherwise no pulmonary embolism.  EKG shows paced rhythm.  COVID-19 was negative.  Rest of the labs are remarkable for BNP of 165 patient started on nebulizer treatment IV steroids and admitted for COPD exacerbation.  Since x-ray was also showing pneumonia patient is on empiric antibiotics.  Review of Systems: As per HPI, rest all negative.   Past Medical History:  Diagnosis Date   Abnormal CT scan, chest 2012   CT chest, several lymphadenopathies. Sees pulmonary   Anemia    intermittent   Arthritis    "?back" (09/19/2018)   Atrial fibrillation (HCC)    s/p AV node ablation & BiV PPM implantation 09/08/11 (op dictation pending)   BPH (benign prostatic hyperplasia)    Saw Dr [name] 2004, normal renal u/s   Bronchitis 08/26/2017   CAD (coronary artery disease)    CHF (congestive heart failure) (HCC)    Thought primarily to be non-systolic although EF down (EF 99-24% 12/2010, down to 35-40% 09/05/11), cath 2008 with no CAD, nuclear study 07/2011 showing Small area of reversibility in the distal ant/lat wall the left ventricle suspicious for  ischemia/septal wall HK but felt to be low risk  (per D/C Summary 07/2011)   Chronic bronchitis (HCC)    "get it ~ q yr"   Heart murmur    HLD (hyperlipidemia)    HTN (hypertension)    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   Pleural effusion 2008   S/p decortication   Pulmonary HTN (HCC)    per cath 2008   Type II diabetes mellitus (HCC) 1999    Past Surgical History:  Procedure Laterality Date   BI-VENTRICULAR PACEMAKER INSERTION Left 09/08/2011   Procedure: BI-VENTRICULAR PACEMAKER INSERTION (CRT-P);  Surgeon: Marinus Maw, MD;  Location: [facility] CATH LAB;  Service: Cardiovascular;  Laterality: Left;   CARDIAC CATHETERIZATION     "couple times; never had balloon or stent" (09/19/2018)   CATARACT EXTRACTION W/ INTRAOCULAR LENS  IMPLANT, BILATERAL Bilateral 08/2018   COLONOSCOPY  03/10/11   normal   INSERT / REPLACE / REMOVE PACEMAKER  09/08/11   pacemaker placement   LUNG DECORTICATION     PLEURAL SCARIFICATION     pneumothorax with fibrothorax  ~ 2010   TONSILLECTOMY     "as a kid"      reports that he quit smoking about 38 years ago. His  smoking use included cigarettes and cigars. He has a 62.50 pack-year smoking history. He has never used smokeless tobacco. He reports previous alcohol use. He reports that he does not use drugs.  Allergies  Allergen Reactions   Hydrocodone Other (See Comments)    "given to him in the hospital; went thru withdrawals once home; dr said not to take it again" (09/27/2012)   Tramadol Other (See Comments)    "given to him in the hospital; went thru withdrawals once home; dr said not to take it again" (09/27/2012)   Tadalafil Other (See Comments)    headache, backache   Avelox [Moxifloxacin Hydrochloride] Itching and Other (See Comments)     Headache    Family History  Problem Relation Age of Onset   Diabetes Father    Coronary artery disease Father    Polycythemia Mother    Drug abuse Son    Breast cancer Maternal Aunt    Prostate cancer Neg Hx    Colon cancer Neg Hx     Prior to Admission medications   Medication Sig Start Date End Date Taking? Authorizing Provider  acetaminophen (TYLENOL) 500 MG tablet Take 500 mg by mouth every 6 (six) hours as needed for headache.   Yes [provider]  amLODipine (NORVASC) 10 MG tablet Take 1 tablet (10 mg total) by mouth daily. 10/30/18  Yes Wanda PlumpPaz, Jose E, MD  aspirin 81 MG tablet Take 81 mg by mouth every morning.    Yes [provider]  atorvastatin (LIPITOR) 20 MG tablet Take 1 tablet (20 mg total) by mouth at bedtime. 10/30/18  Yes Paz, Nolon RodJose E, MD  calcium-vitamin D (OSCAL WITH D) 500-200 MG-UNIT tablet Take 1 tablet by mouth every morning.   Yes [provider]  carvedilol (COREG) 25 MG tablet Take 1 tablet (25 mg total) by mouth 2 (two) times daily with a meal. 12/04/18  Yes Paz, Jose E, MD  diphenhydramine-acetaminophen (TYLENOL PM EXTRA STRENGTH) 25-500 MG TABS tablet Take 1 tablet by mouth at bedtime. 05/31/19  Yes Paz, Nolon RodJose E, MD  furosemide (LASIX) 40 MG tablet Take 1 tablet (40 mg total) by mouth 2 (two) times daily. 05/31/19  Yes Paz, Nolon RodJose E, MD  ipratropium-albuterol (DUONEB) 0.5-2.5 (3) MG/3ML SOLN Take 3 mLs by nebulization 2 (two) times daily as needed. 09/25/18  Yes Dow AdolphHall, Carole N, DO  losartan (COZAAR) 100 MG tablet Take 0.5 tablets (50 mg total) by mouth at bedtime. 01/22/19  Yes Wanda PlumpPaz, Jose E, MD  Multiple Vitamins-Minerals (MULTIVITAMINS THER. W/MINERALS) TABS tablet Take 1 tablet by mouth daily. 09/14/13  Yes Paz, Nolon RodJose E, MD  nitroGLYCERIN (NITROSTAT) 0.4 MG SL tablet Place 1 tablet (0.4 mg total) under the tongue every 5 (five) minutes x 3 doses as needed for chest pain. 01/10/19  Yes Paz, Nolon RodJose E, MD  pantoprazole (PROTONIX) 40 MG tablet  Take 1 tablet (40 mg total) by mouth every morning. 11/06/18  Yes Paz, Nolon RodJose E, MD  sitaGLIPtin (JANUVIA) 100 MG tablet Take 1 tablet (100 mg total) by mouth daily. 05/28/19  Yes Paz, Nolon RodJose E, MD  tamsulosin (FLOMAX) 0.4 MG CAPS capsule Take 1 capsule (0.4 mg total) by mouth daily after supper. 06/14/19  Yes Paz, Elita QuickJose E, MD  budesonide (PULMICORT) 0.5 MG/2ML nebulizer solution Take 2 mLs (0.5 mg total) by nebulization 2 (two) times daily for 30 days. 12/26/18 08/29/19  Dimple NanasAmin, Ankit Chirag, MD  Lancets [facility](ONETOUCH ULTRASOFT) lancets Check blood sugars no more than twice daily 03/08/17  Colon Branch, MD    Physical Exam: Constitutional: Moderately built and nourished. Vitals:   09/13/19 1930 09/13/19 2030 09/13/19 2100 09/13/19 2130  BP: 123/90  137/71 (!) 166/91  Pulse: 94 71 70 70  Resp: (!) 22 (!) 33 (!) 21 (!) 26  Temp:      TempSrc:      SpO2: (!) 82% (!) 86% 91% 90%  Weight:      Height:       Eyes: Anicteric no pallor. ENMT: No discharge from the ears eyes nose or mouth. Neck: No mass felt.  No neck rigidity. Respiratory: Bilateral expiratory wheeze and no crepitations. Cardiovascular: S1-S2 heard. Abdomen: Soft nontender bowel sounds present. Musculoskeletal: No edema.  No joint effusion. Skin: No rash. Neurologic: Alert awake oriented to time place and person.  Moves all extremities. Psychiatric: Appears normal per normal affect.   Labs on Admission: I have personally reviewed following labs and imaging studies  CBC: Recent Labs  Lab 09/13/19 1603  WBC 8.2  NEUTROABS 6.3  HGB 13.7  HCT 41.4  MCV 93.2  PLT 938   Basic Metabolic Panel: Recent Labs  Lab 09/13/19 1603  NA 138  K 4.4  CL 99  CO2 30  GLUCOSE 128*  BUN 12  CREATININE 0.85  CALCIUM 8.3*   GFR: Estimated Creatinine Clearance: 83.4 mL/min (by C-G formula based on SCr of 0.85 mg/dL). Liver Function Tests: Recent Labs  Lab 09/13/19 1603  AST 27  ALT 16  ALKPHOS 91  BILITOT 0.8  PROT 6.7  ALBUMIN  3.5   No results for input(s): LIPASE, AMYLASE in the last 168 hours. No results for input(s): AMMONIA in the last 168 hours. Coagulation Profile: No results for input(s): INR, PROTIME in the last 168 hours. Cardiac Enzymes: No results for input(s): CKTOTAL, CKMB, CKMBINDEX, TROPONINI in the last 168 hours. BNP (last 3 results) No results for input(s): PROBNP in the last 8760 hours. HbA1C: No results for input(s): HGBA1C in the last 72 hours. CBG: No results for input(s): GLUCAP in the last 168 hours. Lipid Profile: No results for input(s): CHOL, HDL, LDLCALC, TRIG, CHOLHDL, LDLDIRECT in the last 72 hours. Thyroid Function Tests: No results for input(s): TSH, T4TOTAL, FREET4, T3FREE, THYROIDAB in the last 72 hours. Anemia Panel: No results for input(s): VITAMINB12, FOLATE, FERRITIN, TIBC, IRON, RETICCTPCT in the last 72 hours. Urine analysis:    Component Value Date/Time   COLORURINE YELLOW 07/15/2015 1440   APPEARANCEUR CLEAR 07/15/2015 1440   LABSPEC 1.015 07/15/2015 1440   PHURINE 6.0 07/15/2015 1440   GLUCOSEU NEGATIVE 07/15/2015 1440   HGBUR TRACE-LYSED (A) 07/15/2015 1440   BILIRUBINUR NEGATIVE 07/15/2015 1440   KETONESUR TRACE (A) 07/15/2015 1440   PROTEINUR NEGATIVE 07/06/2012 0341   UROBILINOGEN 1.0 07/15/2015 1440   NITRITE NEGATIVE 07/15/2015 1440   LEUKOCYTESUR NEGATIVE 07/15/2015 1440   Sepsis Labs: @LABRCNTIP (procalcitonin:4,lacticidven:4) )No results found for this or any previous visit (from the past 240 hour(s)).   Radiological Exams on Admission: Ct Angio Chest Pe W And/or Wo Contrast  Result Date: 09/13/2019 CLINICAL DATA:  Shortness of breath, fever, cough, hypoxia. EXAM: CT ANGIOGRAPHY CHEST WITH CONTRAST TECHNIQUE: Multidetector CT imaging of the chest was performed using the standard protocol during bolus administration of intravenous contrast. Multiplanar CT image reconstructions and MIPs were obtained to evaluate the vascular anatomy. CONTRAST:   89mL OMNIPAQUE IOHEXOL 350 MG/ML SOLN COMPARISON:  Radiograph earlier this day. Chest CTA 05/09/2019 FINDINGS: Cardiovascular: There are no filling defects within the  pulmonary arteries to suggest pulmonary embolus. Atherosclerosis of the thoracic aorta. Examination not tailored for aortic evaluation, allowing for this, no periaortic stranding. Multi chamber cardiomegaly. Contrast refluxes into the hepatic veins and IVC. Pacemaker in place. No pericardial effusion. Mediastinum/Nodes: Prominent and mildly enlarged lymph nodes, similar to prior exam. Right upper paratracheal node measures 15 mm short axis, slightly decreased. Right subcarinal node measures 2.2 cm short axis, mildly increased. No esophageal wall thickening. No suspicious thyroid nodule. AP window node measures 15 mm, unchanged. Lungs/Pleura: Heterogeneous pulmonary parenchyma, similar to prior exam, possible small airways disease. Small focal consolidation in the lingula with air bronchograms. Scattered linear opacities in both lobes, improved from prior exam. Minimal right pleural thickening with resolution of previous pleural effusion. Scattered areas of diaphragmatic calcification at the right lung base, unchanged. Upper Abdomen: Contrast refluxes into the hepatic veins and IVC. Atherosclerosis of upper abdominal vasculature. Musculoskeletal: There are no acute or suspicious osseous abnormalities. Degenerative change of both shoulders and throughout the spine. Review of the MIP images confirms the above findings. IMPRESSION: 1. No pulmonary embolus. 2. Multi chamber cardiomegaly with reflux of contrast into the hepatic veins and IVC, consistent with elevated right heart pressures. 3. Small focal consolidation in the lingula with air bronchograms, suspicious for bronchopneumonia. 4. Chronic findings include heterogeneous pulmonary attenuation suggesting underlying small airways disease. Mild mediastinal adenopathy, similar to prior exam, likely  reactive. Aortic Atherosclerosis (ICD10-I70.0). Electronically Signed   By: Narda Rutherford M.D.   On: 09/13/2019 20:06   Dg Chest Portable 1 View  Result Date: 09/13/2019 CLINICAL DATA:  Cough.  Shortness of breath. EXAM: PORTABLE CHEST 1 VIEW COMPARISON:  06/13/2019.  05/07/2019.  12/21/2018.  CT 05/09/2019. FINDINGS: Cardiac pacer noted with lead tips over the right ventricle. Stable cardiomegaly and pulmonary venous congestion. Chronic interstitial changes again noted. Superimposed acute interstitial process cannot be excluded. Small bilateral pleural effusions versus scarring. No pneumothorax. IMPRESSION: 1. Cardiac pacer noted with lead tips in the right ventricle. Stable cardiomegaly and pulmonary venous congestion. 2. Chronic pulmonary interstitial changes again noted. A superimposed active interstitial process such as interstitial edema and/or pneumonitis cannot be excluded. Small bilateral pleural effusions versus pleural scarring. Electronically Signed   By: Maisie Fus  Register   On: 09/13/2019 16:18    EKG: Independently reviewed.  Paced rhythm.  Assessment/Plan Principal Problem:   Acute respiratory failure (HCC) Active Problems:   DM II (diabetes mellitus, type II), controlled (HCC)   Hyperlipidemia   Essential hypertension   Permanent atrial fibrillation   PAD (peripheral artery disease) (HCC)   Non-ischemic cardiomyopathy (HCC)   Sinoatrial node dysfunction (HCC)   Pacemaker-[employer]   COPD with acute exacerbation (HCC)   CAP (community acquired pneumonia)   Acute respiratory failure with hypoxia (HCC)    1. Acute respiratory failure with hypoxia secondary to COPD exacerbation with pneumonia for which patient has been placed on IV Solu-Medrol nebulizer and Pulmicort. 2. Community-acquired pneumonia for which patient is on ceftriaxone and Zithromax.  COVID-19 was negative.  Check urine for Legionella and strep antigen.  Sputum cultures. 3. Diabetes mellitus type 2 we will  keep patient on sliding scale coverage. 4. A. fib status post AV nodal ablation and biventricular pacemaker placement not on anticoagulation secondary history of intracranial hemorrhage on carvedilol for rate control. 5. Hypertension on carvedilol ARB and amlodipine. 6. History of CAD on aspirin statins and carvedilol.  Chest pain only on coughing.  Cardiac markers negative. 7. History of diastolic dysfunction appears to be compensated.  Since patient has acute respiratory failure with hypoxia requiring oxygen more than usual.  With active wheezing patient will need more than 2 midnight stay and inpatient status.   DVT prophylaxis: Lovenox. Code Status: Full code. Family Communication: Discussed with patient. Disposition Plan: Home. Consults called: None. Admission status: Inpatient.   Eduard ClosArshad N Chauntay Paszkiewicz MD [facility] Pager [phone].  If 7PM-7AM, please contact night-coverage [email or web address] Password [email or web address]  09/13/2019, 10:41 PM

## 2019-09-13 NOTE — ED Provider Notes (Signed)
[facility] EMERGENCY DEPARTMENT Provider Note   CSN: [id number] Arrival date & time: 09/13/19  1533     History   Chief Complaint Chief Complaint  Patient presents with   Fever   Shortness of Breath   Cough    HPI Bradley BELLANTI Sr. is a 78 y.o. male.     HPI  Bradley [name] is a 78 year old male with PMH of Afib s/p ablation and PPM (not on AC 2/2 to prior ICH), CAD, CHF (EF 55-60%), HTN, HLD, pHTN, DMII, COPD on 2L [place] at baseline who presents to the ED with shortness of breath and cough. Patient reports over the last 3 to 4 days he has had a dry cough.  Patient reports he is also had shortness of breath that has been worsening since overnight.  Patient reports he had difficulty sleeping last night due to trouble catching his breath.  He states even getting up and doing small tasks caused him to become short of breath.  Patient reports he typically wears 2 to 3 L oxygen at night and when he is walking long distances but does not always wear oxygen at home.  He denies any fever.  Patient denies any known sick contacts but lives with multiple family members who are in and out of the house.  He received Solu-Medrol and duo nebs with EMS and reports this helped his breathing.  Of note, patient is on diuretics at home and states he has been compliant with his medications.  Upon review of systems, patient endorses a intermittent left-sided chest pain that he states is brief and has been occurring for the last 3 to 4 years.  No current chest pain.  No abdominal pain.  No nausea, vomiting, diarrhea.  Past Medical History:  Diagnosis Date   Abnormal CT scan, chest 2012   CT chest, several lymphadenopathies. Sees pulmonary   Anemia    intermittent   Arthritis    "?back" (09/19/2018)   Atrial fibrillation (HCC)    s/p AV node ablation & BiV PPM implantation 09/08/11 (op dictation pending)   BPH (benign prostatic hyperplasia)    Saw Dr [name] 2004, normal renal u/s     Bronchitis 08/26/2017   CAD (coronary artery disease)    CHF (congestive heart failure) (HCC)    Thought primarily to be non-systolic although EF down (EF 32-20% 12/2010, down to 35-40% 09/05/11), cath 2008 with no CAD, nuclear study 07/2011 showing Small area of reversibility in the distal ant/lat wall the left ventricle suspicious for ischemia/septal wall HK but felt to be low risk  (per D/C Summary 07/2011)   Chronic bronchitis (HCC)    "get it ~ q yr"   Heart murmur    HLD (hyperlipidemia)    HTN (hypertension)    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   Pleural effusion 2008   S/p decortication   Pulmonary HTN (HCC)    per cath 2008   Type II diabetes mellitus (HCC) 1999    Patient Active Problem List   Diagnosis Date Noted   Acute respiratory failure (HCC) 09/13/2019   CAP (community acquired pneumonia) 09/13/2019   Acute respiratory failure with hypoxia (HCC) 09/13/2019   COPD with acute exacerbation (  HCC) 12/22/2018   COPD (chronic obstructive pulmonary disease) (HCC) 12/22/2018   Acute on chronic respiratory failure with hypoxia (HCC) 05/25/2018   PCP NOTES >>> 07/15/2015   Acute bronchitis vs Upper Airway cough syndrome 09/07/2014   Annual physical exam 05/08/2012   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   Lymphadenopathy 09/23/2011   Sinoatrial node dysfunction (HCC)    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   Acute on chronic diastolic (congestive) heart failure (HCC)    PAD (peripheral artery disease) (HCC) 10/22/2010   DERMATITIS, STASIS 09/14/2010   Chronic respiratory failure with hypoxia (HCC) 09/14/2010   Chronic pulmonary hypertension secondary to elevated L H pressures  04/15/2009   ERECTILE DYSFUNCTION 01/06/2009   Hyperlipidemia  12/13/2007   Essential hypertension 12/13/2007   Anxiety--insomnia 08/14/2007   DM II (diabetes mellitus, type II), controlled (HCC) 01/02/2007   Permanent atrial fibrillation 01/02/2007   Benign enlargement of prostate 01/02/2007    Past Surgical History:  Procedure Laterality Date   BI-VENTRICULAR PACEMAKER INSERTION Left 09/08/2011   Procedure: BI-VENTRICULAR PACEMAKER INSERTION (CRT-P);  Surgeon: Marinus MawGregg W Taylor, MD;  [facility];  Service: Cardiovascular;  Laterality: Left;   CARDIAC CATHETERIZATION     "couple times; never had balloon or stent" (09/19/2018)   CATARACT EXTRACTION W/ INTRAOCULAR LENS  IMPLANT, BILATERAL Bilateral 08/2018   COLONOSCOPY  03/10/11   normal   INSERT / REPLACE / REMOVE PACEMAKER  09/08/11   pacemaker placement   LUNG DECORTICATION     PLEURAL SCARIFICATION     pneumothorax with fibrothorax  ~ 2010   TONSILLECTOMY     "as a kid"         Home Medications    Prior to Admission medications   Medication Sig Start Date End Date Taking? Authorizing Provider  acetaminophen (TYLENOL) 500 MG tablet Take 500 mg by mouth every 6 (six) hours as needed for headache.   Yes [provider]  amLODipine (NORVASC) 10 MG tablet Take 1 tablet (10 mg total) by mouth daily. 10/30/18  Yes Wanda PlumpPaz, Jose E, MD  aspirin 81 MG tablet Take 81 mg by mouth every morning.    Yes [provider]  atorvastatin (LIPITOR) 20 MG tablet Take 1 tablet (20 mg total) by mouth at bedtime. 10/30/18  Yes Paz, Nolon RodJose E, MD  calcium-vitamin D (OSCAL WITH D) 500-200 MG-UNIT tablet Take 1 tablet by mouth every morning.   Yes [provider]  carvedilol (COREG) 25 MG tablet Take 1 tablet (25 mg total) by mouth 2 (two) times daily with a meal. 12/04/18  Yes Paz, Jose E, MD  diphenhydramine-acetaminophen (TYLENOL PM EXTRA STRENGTH) 25-500 MG TABS tablet Take 1 tablet by mouth at bedtime. 05/31/19  Yes Paz, Nolon RodJose E, MD  furosemide (LASIX) 40 MG tablet Take 1  tablet (40 mg total) by mouth 2 (two) times daily. 05/31/19  Yes Paz, Nolon RodJose E, MD  ipratropium-albuterol (DUONEB) 0.5-2.5 (3) MG/3ML SOLN Take 3 mLs by nebulization 2 (two) times daily as needed. 09/25/18  Yes Dow AdolphHall, Carole N, DO  losartan (COZAAR) 100 MG tablet Take 0.5 tablets (50 mg total) by mouth at bedtime. 01/22/19  Yes Wanda PlumpPaz, Jose E, MD  Multiple Vitamins-Minerals (MULTIVITAMINS THER. W/MINERALS) TABS tablet Take 1 tablet by mouth daily. 09/14/13  Yes Paz, Nolon RodJose E, MD  nitroGLYCERIN (NITROSTAT) 0.4 MG SL tablet Place 1 tablet (0.4 mg total) under the tongue every 5 (five) minutes x 3 doses as needed for chest pain. 01/10/19  Yes Wanda PlumpPaz, Jose E, MD  pantoprazole (PROTONIX) 40  MG tablet Take 1 tablet (40 mg total) by mouth every morning. 11/06/18  Yes Paz, Alda Berthold, MD  sitaGLIPtin (JANUVIA) 100 MG tablet Take 1 tablet (100 mg total) by mouth daily. 05/28/19  Yes Paz, Alda Berthold, MD  tamsulosin (FLOMAX) 0.4 MG CAPS capsule Take 1 capsule (0.4 mg total) by mouth daily after supper. 06/14/19  Yes Paz, Jacqulyn Bath E, MD  budesonide (PULMICORT) 0.5 MG/2ML nebulizer solution Take 2 mLs (0.5 mg total) by nebulization 2 (two) times daily for 30 days. 12/26/18 08/29/19  Damita Lack, MD  Lancets [facility] ULTRASOFT) lancets Check blood sugars no more than twice daily 03/08/17   Colon Branch, MD    Family History Family History  Problem Relation Age of Onset   Diabetes Father    Coronary artery disease Father    Polycythemia Mother    Drug abuse Son    Breast cancer Maternal Aunt    Prostate cancer Neg Hx    Colon cancer Neg Hx     Social History Social History   Tobacco Use   Smoking status: Former Smoker    Packs/day: 2.50    Years: 25.00    Pack years: 62.50    Types: Cigarettes, Cigars    Quit date: 10/11/1980    Years since quitting: 38.9   Smokeless tobacco: Never Used  Substance Use Topics   Alcohol use: Not Currently    Comment: 1 can of beer ever 4-5 months   Drug use: No      Allergies   Hydrocodone, Tramadol, Tadalafil, and Avelox [moxifloxacin hydrochloride]   Review of Systems Review of Systems  Constitutional: Negative for fever.  HENT: Negative for congestion.   Respiratory: Positive for cough and shortness of breath.   Cardiovascular: Positive for chest pain. Negative for leg swelling.  Gastrointestinal: Negative for abdominal pain, diarrhea, nausea and vomiting.  Genitourinary: Negative for dysuria.  Neurological: Negative for headaches.  Psychiatric/Behavioral: Negative for agitation and behavioral problems.     Physical Exam Updated Vital Signs BP 130/66    Pulse 69    Temp 97.9 F (36.6 C) (Oral)    Resp (!) 28    Ht 5\' 9"  (1.753 m)    Wt 99.8 kg    SpO2 99%    BMI 32.49 kg/m   Physical Exam Vitals signs and nursing note reviewed.  HENT:     Head: Normocephalic and atraumatic.  Eyes:     Extraocular Movements: Extraocular movements intact.     Conjunctiva/sclera: Conjunctivae normal.  Cardiovascular:     Rate and Rhythm: Normal rate and regular rhythm.     Pulses: Normal pulses.  Pulmonary:     Effort: No respiratory distress.     Breath sounds: Wheezing present.     Comments: Decreased respiratory sounds throughout  Abdominal:     General: Abdomen is flat. There is no distension.     Tenderness: There is no abdominal tenderness.  Musculoskeletal: Normal range of motion.  Skin:    General: Skin is warm and dry.  Neurological:     General: No focal deficit present.     Mental Status: He is alert and oriented to person, place, and time.  Psychiatric:        Mood and Affect: Mood normal.        Behavior: Behavior normal.      ED Treatments / Results  Labs (all labs ordered are listed, but only abnormal results are displayed) Labs Reviewed  COMPREHENSIVE METABOLIC PANEL -  Abnormal; Notable for the following components:      Result Value   Glucose, Bld 128 (*)    Calcium 8.3 (*)    All other components within  normal limits  BRAIN NATRIURETIC PEPTIDE - Abnormal; Notable for the following components:   B Natriuretic Peptide 165.6 (*)    All other components within normal limits  BASIC METABOLIC PANEL - Abnormal; Notable for the following components:   Glucose, Bld 243 (*)    All other components within normal limits  CBG MONITORING, ED - Abnormal; Notable for the following components:   Glucose-Capillary 236 (*)    All other components within normal limits  SARS CORONAVIRUS 2 (TAT 6-24 HRS)  CBC WITH DIFFERENTIAL/PLATELET  CBC  CBC  CREATININE, SERUM  HIV ANTIBODY (ROUTINE TESTING W REFLEX)  POC SARS CORONAVIRUS 2 AG -  ED  TROPONIN I (HIGH SENSITIVITY)  TROPONIN I (HIGH SENSITIVITY)  TROPONIN I (HIGH SENSITIVITY)  TROPONIN I (HIGH SENSITIVITY)    EKG EKG Interpretation  Date/Time:  Thursday September 13 2019 15:44:37 EST Ventricular Rate:  74 PR Interval:    QRS Duration: 152 QT Interval:  450 QTC Calculation: 500 R Axis:   -94 Text Interpretation: VENTRICULAR PACED RHYTHM Anterior infarct, old Premature ventricular complexes No significant change since last tracing Confirmed by Gwyneth SproutPlunkett, Whitney ([id number]) on 09/13/2019 4:40:18 PM   Radiology Ct Angio Chest Pe W And/or Wo Contrast  Result Date: 09/13/2019 CLINICAL DATA:  Shortness of breath, fever, cough, hypoxia. EXAM: CT ANGIOGRAPHY CHEST WITH CONTRAST TECHNIQUE: Multidetector CT imaging of the chest was performed using the standard protocol during bolus administration of intravenous contrast. Multiplanar CT image reconstructions and MIPs were obtained to evaluate the vascular anatomy. CONTRAST:  75mL OMNIPAQUE IOHEXOL 350 MG/ML SOLN COMPARISON:  Radiograph earlier this day. Chest CTA 05/09/2019 FINDINGS: Cardiovascular: There are no filling defects within the pulmonary arteries to suggest pulmonary embolus. Atherosclerosis of the thoracic aorta. Examination not tailored for aortic evaluation, allowing for this, no periaortic  stranding. Multi chamber cardiomegaly. Contrast refluxes into the hepatic veins and IVC. Pacemaker in place. No pericardial effusion. Mediastinum/Nodes: Prominent and mildly enlarged lymph nodes, similar to prior exam. Right upper paratracheal node measures 15 mm short axis, slightly decreased. Right subcarinal node measures 2.2 cm short axis, mildly increased. No esophageal wall thickening. No suspicious thyroid nodule. AP window node measures 15 mm, unchanged. Lungs/Pleura: Heterogeneous pulmonary parenchyma, similar to prior exam, possible small airways disease. Small focal consolidation in the lingula with air bronchograms. Scattered linear opacities in both lobes, improved from prior exam. Minimal right pleural thickening with resolution of previous pleural effusion. Scattered areas of diaphragmatic calcification at the right lung base, unchanged. Upper Abdomen: Contrast refluxes into the hepatic veins and IVC. Atherosclerosis of upper abdominal vasculature. Musculoskeletal: There are no acute or suspicious osseous abnormalities. Degenerative change of both shoulders and throughout the spine. Review of the MIP images confirms the above findings. IMPRESSION: 1. No pulmonary embolus. 2. Multi chamber cardiomegaly with reflux of contrast into the hepatic veins and IVC, consistent with elevated right heart pressures. 3. Small focal consolidation in the lingula with air bronchograms, suspicious for bronchopneumonia. 4. Chronic findings include heterogeneous pulmonary attenuation suggesting underlying small airways disease. Mild mediastinal adenopathy, similar to prior exam, likely reactive. Aortic Atherosclerosis (ICD10-I70.0). Electronically Signed   By: Narda RutherfordMelanie  Sanford M.D.   On: 09/13/2019 20:06   Dg Chest Portable 1 View  Result Date: 09/13/2019 CLINICAL DATA:  Cough.  Shortness of breath. EXAM: PORTABLE CHEST  1 VIEW COMPARISON:  06/13/2019.  05/07/2019.  12/21/2018.  CT 05/09/2019. FINDINGS: Cardiac pacer  noted with lead tips over the right ventricle. Stable cardiomegaly and pulmonary venous congestion. Chronic interstitial changes again noted. Superimposed acute interstitial process cannot be excluded. Small bilateral pleural effusions versus scarring. No pneumothorax. IMPRESSION: 1. Cardiac pacer noted with lead tips in the right ventricle. Stable cardiomegaly and pulmonary venous congestion. 2. Chronic pulmonary interstitial changes again noted. A superimposed active interstitial process such as interstitial edema and/or pneumonitis cannot be excluded. Small bilateral pleural effusions versus pleural scarring. Electronically Signed   By: Maisie Fus  Register   On: 09/13/2019 16:18    Procedures Procedures (including critical care time)  Medications Ordered in ED Medications  aspirin EC tablet 81 mg (81 mg Oral Given 09/14/19 1145)  amLODipine (NORVASC) tablet 10 mg (10 mg Oral Given 09/14/19 1145)  atorvastatin (LIPITOR) tablet 20 mg (20 mg Oral Given 09/13/19 2313)  carvedilol (COREG) tablet 25 mg (25 mg Oral Given 09/14/19 1145)  furosemide (LASIX) tablet 40 mg (40 mg Oral Given 09/14/19 1145)  losartan (COZAAR) tablet 50 mg (50 mg Oral Given 09/13/19 2314)  nitroGLYCERIN (NITROSTAT) SL tablet 0.4 mg (has no administration in time range)  pantoprazole (PROTONIX) EC tablet 40 mg (40 mg Oral Given 09/14/19 1145)  tamsulosin (FLOMAX) capsule 0.4 mg (has no administration in time range)  calcium-vitamin D (OSCAL WITH D) 500-200 MG-UNIT per tablet 1 tablet (has no administration in time range)  multivitamin with minerals tablet 1 tablet (1 tablet Oral Given 09/14/19 1145)  acetaminophen (TYLENOL) tablet 650 mg (has no administration in time range)    Or  acetaminophen (TYLENOL) suppository 650 mg (has no administration in time range)  ondansetron (ZOFRAN) tablet 4 mg (has no administration in time range)    Or  ondansetron (ZOFRAN) injection 4 mg (has no administration in time range)  insulin aspart  (novoLOG) injection 0-9 Units (0 Units Subcutaneous Not Given 09/14/19 0900)  enoxaparin (LOVENOX) injection 40 mg (40 mg Subcutaneous Given 09/14/19 0104)  cefTRIAXone (ROCEPHIN) 2 g in sodium chloride 0.9 % 100 mL IVPB (has no administration in time range)  azithromycin (ZITHROMAX) 500 mg in sodium chloride 0.9 % 250 mL IVPB (has no administration in time range)  acetaminophen (TYLENOL) tablet 500 mg (500 mg Oral Given 09/13/19 2315)    And  diphenhydrAMINE (BENADRYL) capsule 25 mg (25 mg Oral Given 09/13/19 2314)  ipratropium-albuterol (DUONEB) 0.5-2.5 (3) MG/3ML nebulizer solution 3 mL (3 mLs Nebulization Given 09/14/19 1145)  ipratropium-albuterol (DUONEB) 0.5-2.5 (3) MG/3ML nebulizer solution 3 mL (has no administration in time range)  methylPREDNISolone sodium succinate (SOLU-MEDROL) 40 mg/mL injection 40 mg (40 mg Intravenous Given 09/14/19 0448)  budesonide (PULMICORT) nebulizer solution 0.25 mg (0.25 mg Nebulization Not Given 09/14/19 1004)  albuterol (VENTOLIN HFA) 108 (90 Base) MCG/ACT inhaler 8 puff (8 puffs Inhalation Given 09/13/19 1730)  ipratropium (ATROVENT HFA) inhaler 4 puff (4 puffs Inhalation Given 09/13/19 1931)  albuterol (VENTOLIN HFA) 108 (90 Base) MCG/ACT inhaler 8 puff (8 puffs Inhalation Given 09/13/19 1931)  iohexol (OMNIPAQUE) 350 MG/ML injection 75 mL (75 mLs Intravenous Contrast Given 09/13/19 1943)  cefTRIAXone (ROCEPHIN) 1 g in sodium chloride 0.9 % 100 mL IVPB (0 g Intravenous Stopped 09/13/19 2135)  azithromycin (ZITHROMAX) tablet 500 mg (500 mg Oral Given 09/13/19 2129)     Initial Impression / Assessment and Plan / ED Course  I have reviewed the triage vital signs and the nursing notes.  Pertinent labs & imaging results  that were available during my care of the patient were reviewed by me and considered in my medical decision making (see chart for details).       Per report, patient was found to be 87% on home 2 L and was put on 6 L nasal cannula.  On arrival,  patient is afebrile, mildly tachypneic, satting in the mid 90s on 5 L nasal cannula.  Patient is in no acute respiratory distress.  He appears to be speaking in short sentences but reports this is his baseline speech pattern.  No obvious peripheral evidence of acute fluid overload. Lungs with decreased air movement throughout and wheezing noted on the left. Considered: Viral URI, pneumonia, CHF exacerbation, COPD exacerbation, ACS  EKG: Ventricularly paced, occasional PVCs Troponin negative x2, low suspicion for ACS CXR: ? interstitial edema vs pneumonitis per radiology; possible small bilateral pleural effusions  Pt given albuterol, upon reassessment, reports feeling some better. Pt found to be hypoxic to 80s upon standing on home 2L and increased work of breathing.  CT PE study obtained without evidence of PE but concern for lingular PNA. Pt treated for CAP.  Hospitalist contacted for admission for PNA-COPD exacerbation. They have assumed care of pt. Pt and wife updated on findings and plan and agreeable. No further acute events.    Final Clinical Impressions(s) / ED Diagnoses   Final diagnoses:  COPD exacerbation (HCC)  Community acquired pneumonia, unspecified laterality    ED Discharge Orders    None       Norton Pastel, MD 09/14/19 1250    Gwyneth Sprout, MD 09/15/19 [phone]

## 2019-09-13 NOTE — ED Notes (Signed)
Patient transported to CT 

## 2019-09-13 NOTE — ED Notes (Addendum)
Pt's sats noted to drop to lower 80s upon standing on home o2 (2L) and has a slight increase in work of breathing; pt placed back on 5 L, sats 95%; edp notified

## 2019-09-14 ENCOUNTER — Encounter (HOSPITAL_COMMUNITY): Payer: Self-pay | Admitting: General Practice

## 2019-09-14 ENCOUNTER — Ambulatory Visit: Payer: Medicare Other | Admitting: *Deleted

## 2019-09-14 DIAGNOSIS — J189 Pneumonia, unspecified organism: Secondary | ICD-10-CM | POA: Diagnosis not present

## 2019-09-14 DIAGNOSIS — I480 Paroxysmal atrial fibrillation: Secondary | ICD-10-CM | POA: Diagnosis not present

## 2019-09-14 LAB — CBC
HCT: 42.3 % (ref 39.0–52.0)
Hemoglobin: 14.3 g/dL (ref 13.0–17.0)
MCH: 30.9 pg (ref 26.0–34.0)
MCHC: 33.8 g/dL (ref 30.0–36.0)
MCV: 91.4 fL (ref 80.0–100.0)
Platelets: 187 10*3/uL (ref 150–400)
RBC: 4.63 MIL/uL (ref 4.22–5.81)
RDW: 13.2 % (ref 11.5–15.5)
WBC: 6.8 10*3/uL (ref 4.0–10.5)
nRBC: 0 % (ref 0.0–0.2)

## 2019-09-14 LAB — BASIC METABOLIC PANEL
Anion gap: 11 (ref 5–15)
BUN: 10 mg/dL (ref 8–23)
CO2: 27 mmol/L (ref 22–32)
Calcium: 8.9 mg/dL (ref 8.9–10.3)
Chloride: 99 mmol/L (ref 98–111)
Creatinine, Ser: 0.8 mg/dL (ref 0.61–1.24)
GFR calc Af Amer: >60 mL/min (ref >60)
GFR calc non Af Amer: >60 mL/min (ref >60)
Glucose, Bld: 243 mg/dL — ABNORMAL HIGH (ref 70–99)
Potassium: 4.2 mmol/L (ref 3.5–5.1)
Sodium: 137 mmol/L (ref 135–145)

## 2019-09-14 LAB — CREATININE, SERUM
Creatinine, Ser: 0.84 mg/dL (ref 0.61–1.24)
GFR calc Af Amer: >60 mL/min (ref >60)
GFR calc non Af Amer: >60 mL/min (ref >60)

## 2019-09-14 LAB — GLUCOSE, CAPILLARY
Glucose-Capillary: 196 mg/dL — ABNORMAL HIGH (ref 70–99)
Glucose-Capillary: 247 mg/dL — ABNORMAL HIGH (ref 70–99)
Glucose-Capillary: 276 mg/dL — ABNORMAL HIGH (ref 70–99)

## 2019-09-14 LAB — CBG MONITORING, ED: Glucose-Capillary: 236 mg/dL — ABNORMAL HIGH (ref 70–99)

## 2019-09-14 LAB — TROPONIN I (HIGH SENSITIVITY)
Troponin I (High Sensitivity): 6 ng/L (ref <18)
Troponin I (High Sensitivity): 6 ng/L (ref <18)

## 2019-09-14 LAB — SARS CORONAVIRUS 2 (TAT 6-24 HRS): SARS Coronavirus 2: NEGATIVE

## 2019-09-14 LAB — HIV ANTIBODY (ROUTINE TESTING W REFLEX): HIV Screen 4th Generation wRfx: NONREACTIVE

## 2019-09-14 MED ORDER — IPRATROPIUM-ALBUTEROL 0.5-2.5 (3) MG/3ML IN SOLN
3.0000 mL | RESPIRATORY_TRACT | Status: DC | PRN
  Administered 2019-09-15: 3 mL via RESPIRATORY_TRACT
  Filled 2019-09-14: qty 3

## 2019-09-14 MED ORDER — IPRATROPIUM-ALBUTEROL 0.5-2.5 (3) MG/3ML IN SOLN
3.0000 mL | Freq: Three times a day (TID) | RESPIRATORY_TRACT | Status: DC
  Administered 2019-09-15 – 2019-09-19 (×12): 3 mL via RESPIRATORY_TRACT
  Filled 2019-09-14 (×13): qty 3

## 2019-09-14 MED ORDER — METHYLPREDNISOLONE SODIUM SUCC 40 MG IJ SOLR
40.0000 mg | Freq: Two times a day (BID) | INTRAMUSCULAR | Status: DC
  Administered 2019-09-14 – 2019-09-15 (×3): 40 mg via INTRAVENOUS
  Filled 2019-09-14 (×3): qty 1

## 2019-09-14 MED ORDER — BUDESONIDE 0.25 MG/2ML IN SUSP
0.2500 mg | Freq: Two times a day (BID) | RESPIRATORY_TRACT | Status: DC
  Administered 2019-09-14 – 2019-09-19 (×10): 0.25 mg via RESPIRATORY_TRACT
  Filled 2019-09-14 (×12): qty 2

## 2019-09-14 MED ORDER — IPRATROPIUM-ALBUTEROL 0.5-2.5 (3) MG/3ML IN SOLN
3.0000 mL | RESPIRATORY_TRACT | Status: DC
  Administered 2019-09-14 (×3): 3 mL via RESPIRATORY_TRACT
  Filled 2019-09-14 (×4): qty 3

## 2019-09-14 NOTE — Progress Notes (Signed)
Assessed patient for PRN HHN; however, patient requested to receive his nebulizer later.

## 2019-09-14 NOTE — ED Notes (Signed)
Family at bedside. 

## 2019-09-14 NOTE — ED Notes (Signed)
Lunch Tray Ordered @ 1020.  

## 2019-09-14 NOTE — ED Notes (Signed)
Ordered breakfast--Bradley [name] 

## 2019-09-14 NOTE — Progress Notes (Signed)
PROGRESS NOTE    Bradley [name] Sr.  ZDG:[id number] DOB: [birth year] DOA: 09/13/2019 PCP: [name], MD    Brief Narrative:  78 y.o. male with history of atrial fibrillation status post AV nodal ablation and pacemaker placement, diastolic CHF, COPD, CAD and history of intracranial hemorrhage not on anticoagulation for A. fib presents to the ER because of increasing shortness of breath.  Patient states he has been having increasing wheezing and shortness of breath last 2 days.  Has been a nonproductive cough with chest pain on coughing.  Denies any fever chills or sick contacts.  Assessment & Plan:   Principal Problem:   Acute respiratory failure (HCC) Active Problems:   DM II (diabetes mellitus, type II), controlled (HCC)   Hyperlipidemia   Essential hypertension   Permanent atrial fibrillation   PAD (peripheral artery disease) (HCC)   Non-ischemic cardiomyopathy (HCC)   Sinoatrial node dysfunction (HCC)   Pacemaker-Medtronic   COPD with acute exacerbation (HCC)   CAP (community acquired pneumonia)   Acute respiratory failure with hypoxia (HCC)   1. Acute respiratory failure with hypoxia secondary to COPD exacerbation with pneumonia 1. Pt is continued on scheduled IV solumedrol and neb tx 2. Remains stable this AM 3. Continue to wean O2 as tolerated 2. Community-acquired pneumonia 1. COVID is neg 2. Pt is continued on empiric ceftriaxone and Zithromax.  3. urine for Legionella and strep antigen pending 3. Diabetes mellitus type 2 with hyperglycemia 1. Continued on SSI coverage 2.  we will keep patient on sliding scale coverage. 4. A. fib status post AV nodal ablation and biventricular pacemaker placement  1. Pt is not on anticoagulation secondary history of intracranial hemorrhage  2. Pt is continued on carvedilol for rate control. 5. Hypertension 1. BP currently stable 2. Will continue on carvedilol ARB and amlodipine. 6. History of CAD 1. Will continue pt on aspirin  statins and carvedilol.   2. Remains stable at this time 7. History of chronic diastolic dysfunction  1. Currently appears to be compensated.  DVT prophylaxis: Lovenox subq Code Status: Full Family Communication: Pt in room, family not at bedside Disposition Plan: Uncertain at this time  Consultants:     Procedures:     Antimicrobials: Anti-infectives (From admission, onward)   Start     Dose/Rate Route Frequency Ordered Stop   09/14/19 1800  cefTRIAXone (ROCEPHIN) 2 g in sodium chloride 0.9 % 100 mL IVPB     2 g 200 mL/hr over 30 Minutes Intravenous Every 24 hours 09/13/19 2242 09/19/19 1759   09/14/19 1800  azithromycin (ZITHROMAX) 500 mg in sodium chloride 0.9 % 250 mL IVPB     500 mg 250 mL/hr over 60 Minutes Intravenous Every 24 hours 09/13/19 2242 09/19/19 1759   09/13/19 2030  cefTRIAXone (ROCEPHIN) 1 g in sodium chloride 0.9 % 100 mL IVPB     1 g 200 mL/hr over 30 Minutes Intravenous  Once 09/13/19 2021 09/13/19 2135   09/13/19 2030  azithromycin (ZITHROMAX) tablet 500 mg     500 mg Oral  Once 09/13/19 2021 09/13/19 2129       Subjective: Still complaining of sob but feels somewhat better today  Objective: Vitals:   09/14/19 1100 09/14/19 1200 09/14/19 1300 09/14/19 1433  BP: 136/78 130/66 132/70 119/76  Pulse: 70 69 70 79  Resp: (!) 24 (!) 28 (!) 27   Temp:   98.6 F (37 C) 98.2 F (36.8 C)  TempSrc:   Oral Oral  SpO2:  94% 99% 95% 90%  Weight:   91.6 kg   Height:   5\' 9"  (1.753 m)     Intake/Output Summary (Last 24 hours) at 09/14/2019 1525 Last data filed at 09/14/2019 1406 Gross per 24 hour  Intake 223.67 ml  Output 300 ml  Net -76.33 ml   Filed Weights   09/13/19 1555 09/14/19 1300  Weight: 99.8 kg 91.6 kg    Examination:  General exam: Appears calm and comfortable  Respiratory system: Clear to auscultation. Respiratory effort normal. Cardiovascular system: S1 & S2 heard, Regular Gastrointestinal system: Abdomen is nondistended, soft  and nontender. No organomegaly or masses felt. Normal bowel sounds heard. Central nervous system: Alert and oriented. No focal neurological deficits. Extremities: Symmetric 5 x 5 power. Skin: No rashes, lesions Psychiatry: Judgement and insight appear normal. Mood & affect appropriate.   Data Reviewed: I have personally reviewed following labs and imaging studies  CBC: Recent Labs  Lab 09/13/19 1603 09/13/19 2334 09/14/19 0359  WBC 8.2 10.1 6.8  NEUTROABS 6.3  --   --   HGB 13.7 14.2 14.3  HCT 41.4 41.9 42.3  MCV 93.2 90.3 91.4  PLT 190 182 187   Basic Metabolic Panel: Recent Labs  Lab 09/13/19 1603 09/13/19 2334 09/14/19 0359  NA 138  --  137  K 4.4  --  4.2  CL 99  --  99  CO2 30  --  27  GLUCOSE 128*  --  243*  BUN 12  --  10  CREATININE 0.85 0.84 0.80  CALCIUM 8.3*  --  8.9   GFR: Estimated Creatinine Clearance: 85.1 mL/min (by C-G formula based on SCr of 0.8 mg/dL). Liver Function Tests: Recent Labs  Lab 09/13/19 1603  AST 27  ALT 16  ALKPHOS 91  BILITOT 0.8  PROT 6.7  ALBUMIN 3.5   No results for input(s): LIPASE, AMYLASE in the last 168 hours. No results for input(s): AMMONIA in the last 168 hours. Coagulation Profile: No results for input(s): INR, PROTIME in the last 168 hours. Cardiac Enzymes: No results for input(s): CKTOTAL, CKMB, CKMBINDEX, TROPONINI in the last 168 hours. BNP (last 3 results) No results for input(s): PROBNP in the last 8760 hours. HbA1C: No results for input(s): HGBA1C in the last 72 hours. CBG: Recent Labs  Lab 09/14/19 0934 09/14/19 1308  GLUCAP 236* 196*   Lipid Profile: No results for input(s): CHOL, HDL, LDLCALC, TRIG, CHOLHDL, LDLDIRECT in the last 72 hours. Thyroid Function Tests: No results for input(s): TSH, T4TOTAL, FREET4, T3FREE, THYROIDAB in the last 72 hours. Anemia Panel: No results for input(s): VITAMINB12, FOLATE, FERRITIN, TIBC, IRON, RETICCTPCT in the last 72 hours. Sepsis Labs: No results for  input(s): PROCALCITON, LATICACIDVEN in the last 168 hours.  Recent Results (from the past 240 hour(s))  SARS CORONAVIRUS 2 (TAT 6-24 HRS) Nasopharyngeal Nasopharyngeal Swab     Status: None   Collection Time: 09/13/19  7:04 PM   Specimen: Nasopharyngeal Swab  Result Value Ref Range Status   SARS Coronavirus 2 NEGATIVE NEGATIVE Final    Comment: (NOTE) SARS-CoV-2 target nucleic acids are NOT DETECTED. The SARS-CoV-2 RNA is generally detectable in upper and lower respiratory specimens during the acute phase of infection. Negative results do not preclude SARS-CoV-2 infection, do not rule out co-infections with other pathogens, and should not be used as the sole basis for treatment or other patient management decisions. Negative results must be combined with clinical observations, patient history, and epidemiological information. The expected result  is Negative. Fact Sheet for Patients: HairSlick.no Fact Sheet for Healthcare Providers: quierodirigir.com This test is not yet approved or cleared by the [place] FDA and  has been authorized for detection and/or diagnosis of SARS-CoV-2 by FDA under an Emergency Use Authorization (EUA). This EUA will remain  in effect (meaning this test can be used) for the duration of the COVID-19 declaration under Section 56 4(b)(1) of the Act, 21 U.S.C. section 360bbb-3(b)(1), unless the authorization is terminated or revoked sooner. Performed at [facility], [address] [place], [place] [address]      Radiology Studies: Ct Angio Chest Pe W And/or Wo Contrast  Result Date: 09/13/2019 CLINICAL DATA:  Shortness of breath, fever, cough, hypoxia. EXAM: CT ANGIOGRAPHY CHEST WITH CONTRAST TECHNIQUE: Multidetector CT imaging of the chest was performed using the standard protocol during bolus administration of intravenous contrast. Multiplanar CT image reconstructions and MIPs were obtained  to evaluate the vascular anatomy. CONTRAST:  29mL OMNIPAQUE IOHEXOL 350 MG/ML SOLN COMPARISON:  Radiograph earlier this day. Chest CTA 05/09/2019 FINDINGS: Cardiovascular: There are no filling defects within the pulmonary arteries to suggest pulmonary embolus. Atherosclerosis of the thoracic aorta. Examination not tailored for aortic evaluation, allowing for this, no periaortic stranding. Multi chamber cardiomegaly. Contrast refluxes into the hepatic veins and IVC. Pacemaker in place. No pericardial effusion. Mediastinum/Nodes: Prominent and mildly enlarged lymph nodes, similar to prior exam. Right upper paratracheal node measures 15 mm short axis, slightly decreased. Right subcarinal node measures 2.2 cm short axis, mildly increased. No esophageal wall thickening. No suspicious thyroid nodule. AP window node measures 15 mm, unchanged. Lungs/Pleura: Heterogeneous pulmonary parenchyma, similar to prior exam, possible small airways disease. Small focal consolidation in the lingula with air bronchograms. Scattered linear opacities in both lobes, improved from prior exam. Minimal right pleural thickening with resolution of previous pleural effusion. Scattered areas of diaphragmatic calcification at the right lung base, unchanged. Upper Abdomen: Contrast refluxes into the hepatic veins and IVC. Atherosclerosis of upper abdominal vasculature. Musculoskeletal: There are no acute or suspicious osseous abnormalities. Degenerative change of both shoulders and throughout the spine. Review of the MIP images confirms the above findings. IMPRESSION: 1. No pulmonary embolus. 2. Multi chamber cardiomegaly with reflux of contrast into the hepatic veins and IVC, consistent with elevated right heart pressures. 3. Small focal consolidation in the lingula with air bronchograms, suspicious for bronchopneumonia. 4. Chronic findings include heterogeneous pulmonary attenuation suggesting underlying small airways disease. Mild mediastinal  adenopathy, similar to prior exam, likely reactive. Aortic Atherosclerosis (ICD10-I70.0). Electronically Signed   By: Narda Rutherford M.D.   On: 09/13/2019 20:06   Dg Chest Portable 1 View  Result Date: 09/13/2019 CLINICAL DATA:  Cough.  Shortness of breath. EXAM: PORTABLE CHEST 1 VIEW COMPARISON:  06/13/2019.  05/07/2019.  12/21/2018.  CT 05/09/2019. FINDINGS: Cardiac pacer noted with lead tips over the right ventricle. Stable cardiomegaly and pulmonary venous congestion. Chronic interstitial changes again noted. Superimposed acute interstitial process cannot be excluded. Small bilateral pleural effusions versus scarring. No pneumothorax. IMPRESSION: 1. Cardiac pacer noted with lead tips in the right ventricle. Stable cardiomegaly and pulmonary venous congestion. 2. Chronic pulmonary interstitial changes again noted. A superimposed active interstitial process such as interstitial edema and/or pneumonitis cannot be excluded. Small bilateral pleural effusions versus pleural scarring. Electronically Signed   By: Maisie Fus  Register   On: 09/13/2019 16:18    Scheduled Meds: . acetaminophen  500 mg Oral QHS   And  . diphenhydrAMINE  25 mg Oral  QHS  . amLODipine  10 mg Oral Daily  . aspirin EC  81 mg Oral q morning - 10a  . atorvastatin  20 mg Oral QHS  . budesonide (PULMICORT) nebulizer solution  0.25 mg Nebulization BID  . calcium-vitamin D  1 tablet Oral q morning - 10a  . carvedilol  25 mg Oral BID WC  . enoxaparin (LOVENOX) injection  40 mg Subcutaneous Q24H  . furosemide  40 mg Oral BID  . insulin aspart  0-9 Units Subcutaneous TID WC  . ipratropium-albuterol  3 mL Nebulization Q4H  . losartan  50 mg Oral QHS  . methylPREDNISolone (SOLU-MEDROL) injection  40 mg Intravenous Q12H  . multivitamin with minerals  1 tablet Oral Daily  . pantoprazole  40 mg Oral q morning - 10a  . tamsulosin  0.4 mg Oral QPC supper   Continuous Infusions: . azithromycin    . cefTRIAXone (ROCEPHIN)  IV        LOS: 1 day   Marylu Lund, MD [facility] Pager On Amion  If 7PM-7AM, please contact night-coverage 09/14/2019, 3:25 PM

## 2019-09-14 NOTE — ED Notes (Signed)
Pt's sats 78% on monitor, went to assess and pt off O2. 5LNC replaced, sats now up to 92%. Admitting rounding at bedside.

## 2019-09-14 NOTE — ED Notes (Signed)
Per admitting, 88-92% O2 goal for pt

## 2019-09-14 NOTE — ED Notes (Signed)
Patient CBG was 236.

## 2019-09-15 DIAGNOSIS — I480 Paroxysmal atrial fibrillation: Secondary | ICD-10-CM | POA: Diagnosis not present

## 2019-09-15 DIAGNOSIS — J441 Chronic obstructive pulmonary disease with (acute) exacerbation: Secondary | ICD-10-CM | POA: Diagnosis not present

## 2019-09-15 DIAGNOSIS — J189 Pneumonia, unspecified organism: Secondary | ICD-10-CM | POA: Diagnosis not present

## 2019-09-15 LAB — GLUCOSE, CAPILLARY
Glucose-Capillary: 237 mg/dL — ABNORMAL HIGH (ref 70–99)
Glucose-Capillary: 284 mg/dL — ABNORMAL HIGH (ref 70–99)
Glucose-Capillary: 250 mg/dL — ABNORMAL HIGH (ref 70–99)
Glucose-Capillary: 224 mg/dL — ABNORMAL HIGH (ref 70–99)

## 2019-09-15 MED ORDER — METHYLPREDNISOLONE SODIUM SUCC 125 MG IJ SOLR
60.0000 mg | Freq: Two times a day (BID) | INTRAMUSCULAR | Status: DC
  Administered 2019-09-15 – 2019-09-16 (×2): 60 mg via INTRAVENOUS
  Filled 2019-09-15 (×2): qty 2

## 2019-09-15 NOTE — Plan of Care (Signed)

## 2019-09-15 NOTE — Plan of Care (Signed)

## 2019-09-15 NOTE — Progress Notes (Signed)
PROGRESS NOTE    Bradley SearlesJohn J Giambra [name].  ZOX:[id number] DOB: 05/22/1941 DOA: 09/13/2019 PCP: Wanda PlumpPaz, Jose E, MD    Brief Narrative:  78 y.o. male with history of atrial fibrillation status post AV nodal ablation and pacemaker placement, diastolic CHF, COPD, CAD and history of intracranial hemorrhage not on anticoagulation for A. fib presents to the ER because of increasing shortness of breath.  Patient states he has been having increasing wheezing and shortness of breath last 2 days.  Has been a nonproductive cough with chest pain on coughing.  Denies any fever chills or sick contacts.  Assessment & Plan:   Principal Problem:   Acute respiratory failure (HCC) Active Problems:   DM II (diabetes mellitus, type II), controlled (HCC)   Hyperlipidemia   Essential hypertension   Permanent atrial fibrillation   PAD (peripheral artery disease) (HCC)   Non-ischemic cardiomyopathy (HCC)   Sinoatrial node dysfunction (HCC)   Pacemaker-Medtronic   COPD with acute exacerbation (HCC)   CAP (community acquired pneumonia)   Acute respiratory failure with hypoxia (HCC)   1. Acute respiratory failure with hypoxia secondary to COPD exacerbation with pneumonia 1. Pt is continued on scheduled IV solumedrol and neb tx 2. Continue to wean O2 as tolerated 3. Still with considerable wheezing and decreased BS. Pt also breathing with pursed lips and visibly appears sob 4. Will increase solumedrol dose to 60mg  IV BID 2. Community-acquired pneumonia 1. COVID is neg 2. Pt is continued on empiric ceftriaxone and Zithromax.  3. Diabetes mellitus type 2 with hyperglycemia 1. Continued on SSI coverage 2.  we will keep patient on sliding scale coverage while in hospital 4. A. fib status post AV nodal ablation and biventricular pacemaker placement  1. Pt is not on anticoagulation secondary history of intracranial hemorrhage  2. Pt is continued on carvedilol for rate control. 5. Hypertension 1. BP remains stable 2.  Will continue on carvedilol ARB and amlodipine. 6. History of CAD 1. Pt continued on aspirin statins and carvedilol.   2. Remains stable at this time 7. History of chronic diastolic dysfunction  1. Currently appears to be compensated.  DVT prophylaxis: Lovenox subq Code Status: Full Family Communication: Pt in room, family not at bedside Disposition Plan: Uncertain at this time  Consultants:     Procedures:     Antimicrobials: Anti-infectives (From admission, onward)   Start     Dose/Rate Route Frequency Ordered Stop   09/14/19 1800  cefTRIAXone (ROCEPHIN) 2 g in sodium chloride 0.9 % 100 mL IVPB     2 g 200 mL/hr over 30 Minutes Intravenous Every 24 hours 09/13/19 2242 09/19/19 1759   09/14/19 1800  azithromycin (ZITHROMAX) 500 mg in sodium chloride 0.9 % 250 mL IVPB     500 mg 250 mL/hr over 60 Minutes Intravenous Every 24 hours 09/13/19 2242 09/19/19 1759   09/13/19 2030  cefTRIAXone (ROCEPHIN) 1 g in sodium chloride 0.9 % 100 mL IVPB     1 g 200 mL/hr over 30 Minutes Intravenous  Once 09/13/19 2021 09/13/19 2135   09/13/19 2030  azithromycin (ZITHROMAX) tablet 500 mg     500 mg Oral  Once 09/13/19 2021 09/13/19 2129      Subjective: Still complaining of sob and wheezing  Objective: Vitals:   09/15/19 0518 09/15/19 0521 09/15/19 0752 09/15/19 0839  BP: 140/77   133/80  Pulse: 77  75 71  Resp: 19  18   Temp: 97.6 F (36.4 C)     TempSrc:  Oral     SpO2: 94%  91%   Weight:  92.6 kg    Height:        Intake/Output Summary (Last 24 hours) at 09/15/2019 1559 Last data filed at 09/15/2019 1200 Gross per 24 hour  Intake 2087.56 ml  Output 1125 ml  Net 962.56 ml   Filed Weights   09/13/19 1555 09/14/19 1300 09/15/19 0521  Weight: 99.8 kg 91.6 kg 92.6 kg    Examination: General exam: Awake, laying in bed, in nad Respiratory system: Increased sob and wheezing L>R, decreased BS Cardiovascular system: regular rate, s1, s2 Gastrointestinal system: Soft,  nondistended, positive BS Central nervous system: CN2-12 grossly intact, strength intact Extremities: Perfused, no clubbing Skin: Normal skin turgor, no notable skin lesions seen Psychiatry: Mood normal // no visual hallucinations   Data Reviewed: I have personally reviewed following labs and imaging studies  CBC: Recent Labs  Lab 09/13/19 1603 09/13/19 2334 09/14/19 0359  WBC 8.2 10.1 6.8  NEUTROABS 6.3  --   --   HGB 13.7 14.2 14.3  HCT 41.4 41.9 42.3  MCV 93.2 90.3 91.4  PLT 190 182 187   Basic Metabolic Panel: Recent Labs  Lab 09/13/19 1603 09/13/19 2334 09/14/19 0359  NA 138  --  137  K 4.4  --  4.2  CL 99  --  99  CO2 30  --  27  GLUCOSE 128*  --  243*  BUN 12  --  10  CREATININE 0.85 0.84 0.80  CALCIUM 8.3*  --  8.9   GFR: Estimated Creatinine Clearance: 85.6 mL/min (by C-G formula based on SCr of 0.8 mg/dL). Liver Function Tests: Recent Labs  Lab 09/13/19 1603  AST 27  ALT 16  ALKPHOS 91  BILITOT 0.8  PROT 6.7  ALBUMIN 3.5   No results for input(s): LIPASE, AMYLASE in the last 168 hours. No results for input(s): AMMONIA in the last 168 hours. Coagulation Profile: No results for input(s): INR, PROTIME in the last 168 hours. Cardiac Enzymes: No results for input(s): CKTOTAL, CKMB, CKMBINDEX, TROPONINI in the last 168 hours. BNP (last 3 results) No results for input(s): PROBNP in the last 8760 hours. HbA1C: No results for input(s): HGBA1C in the last 72 hours. CBG: Recent Labs  Lab 09/14/19 1308 09/14/19 1745 09/14/19 2120 09/15/19 0747 09/15/19 1146  GLUCAP 196* 247* 276* 237* 284*   Lipid Profile: No results for input(s): CHOL, HDL, LDLCALC, TRIG, CHOLHDL, LDLDIRECT in the last 72 hours. Thyroid Function Tests: No results for input(s): TSH, T4TOTAL, FREET4, T3FREE, THYROIDAB in the last 72 hours. Anemia Panel: No results for input(s): VITAMINB12, FOLATE, FERRITIN, TIBC, IRON, RETICCTPCT in the last 72 hours. Sepsis Labs: No results  for input(s): PROCALCITON, LATICACIDVEN in the last 168 hours.  Recent Results (from the past 240 hour(s))  SARS CORONAVIRUS 2 (TAT 6-24 HRS) Nasopharyngeal Nasopharyngeal Swab     Status: None   Collection Time: 09/13/19  7:04 PM   Specimen: Nasopharyngeal Swab  Result Value Ref Range Status   SARS Coronavirus 2 NEGATIVE NEGATIVE Final    Comment: (NOTE) SARS-CoV-2 target nucleic acids are NOT DETECTED. The SARS-CoV-2 RNA is generally detectable in upper and lower respiratory specimens during the acute phase of infection. Negative results do not preclude SARS-CoV-2 infection, do not rule out co-infections with other pathogens, and should not be used as the sole basis for treatment or other patient management decisions. Negative results must be combined with clinical observations, patient history, and epidemiological information.  The expected result is Negative. Fact Sheet for Patients: SugarRoll.be Fact Sheet for Healthcare Providers: https://www.woods-mathews.com/ This test is not yet approved or cleared by the [place] FDA and  has been authorized for detection and/or diagnosis of SARS-CoV-2 by FDA under an Emergency Use Authorization (EUA). This EUA will remain  in effect (meaning this test can be used) for the duration of the COVID-19 declaration under Section 56 4(b)(1) of the Act, 21 U.S.C. section 360bbb-3(b)(1), unless the authorization is terminated or revoked sooner. Performed at [facility], [place] [address] [place], [place] [address]      Radiology Studies: Ct Angio Chest Pe W And/or Wo Contrast  Result Date: 09/13/2019 CLINICAL DATA:  Shortness of breath, fever, cough, hypoxia. EXAM: CT ANGIOGRAPHY CHEST WITH CONTRAST TECHNIQUE: Multidetector CT imaging of the chest was performed using the standard protocol during bolus administration of intravenous contrast. Multiplanar CT image reconstructions and MIPs were  obtained to evaluate the vascular anatomy. CONTRAST:  54mL OMNIPAQUE IOHEXOL 350 MG/ML SOLN COMPARISON:  Radiograph earlier this day. Chest CTA 05/09/2019 FINDINGS: Cardiovascular: There are no filling defects within the pulmonary arteries to suggest pulmonary embolus. Atherosclerosis of the thoracic aorta. Examination not tailored for aortic evaluation, allowing for this, no periaortic stranding. Multi chamber cardiomegaly. Contrast refluxes into the hepatic veins and IVC. Pacemaker in place. No pericardial effusion. Mediastinum/Nodes: Prominent and mildly enlarged lymph nodes, similar to prior exam. Right upper paratracheal node measures 15 mm short axis, slightly decreased. Right subcarinal node measures 2.2 cm short axis, mildly increased. No esophageal wall thickening. No suspicious thyroid nodule. AP window node measures 15 mm, unchanged. Lungs/Pleura: Heterogeneous pulmonary parenchyma, similar to prior exam, possible small airways disease. Small focal consolidation in the lingula with air bronchograms. Scattered linear opacities in both lobes, improved from prior exam. Minimal right pleural thickening with resolution of previous pleural effusion. Scattered areas of diaphragmatic calcification at the right lung base, unchanged. Upper Abdomen: Contrast refluxes into the hepatic veins and IVC. Atherosclerosis of upper abdominal vasculature. Musculoskeletal: There are no acute or suspicious osseous abnormalities. Degenerative change of both shoulders and throughout the spine. Review of the MIP images confirms the above findings. IMPRESSION: 1. No pulmonary embolus. 2. Multi chamber cardiomegaly with reflux of contrast into the hepatic veins and IVC, consistent with elevated right heart pressures. 3. Small focal consolidation in the lingula with air bronchograms, suspicious for bronchopneumonia. 4. Chronic findings include heterogeneous pulmonary attenuation suggesting underlying small airways disease. Mild  mediastinal adenopathy, similar to prior exam, likely reactive. Aortic Atherosclerosis (ICD10-I70.0). Electronically Signed   By: Keith Rake M.D.   On: 09/13/2019 20:06   Dg Chest Portable 1 View  Result Date: 09/13/2019 CLINICAL DATA:  Cough.  Shortness of breath. EXAM: PORTABLE CHEST 1 VIEW COMPARISON:  06/13/2019.  05/07/2019.  12/21/2018.  CT 05/09/2019. FINDINGS: Cardiac pacer noted with lead tips over the right ventricle. Stable cardiomegaly and pulmonary venous congestion. Chronic interstitial changes again noted. Superimposed acute interstitial process cannot be excluded. Small bilateral pleural effusions versus scarring. No pneumothorax. IMPRESSION: 1. Cardiac pacer noted with lead tips in the right ventricle. Stable cardiomegaly and pulmonary venous congestion. 2. Chronic pulmonary interstitial changes again noted. A superimposed active interstitial process such as interstitial edema and/or pneumonitis cannot be excluded. Small bilateral pleural effusions versus pleural scarring. Electronically Signed   By: [employer]   On: 09/13/2019 16:18    Scheduled Meds: . acetaminophen  500 mg Oral QHS   And  . diphenhydrAMINE  25 mg Oral QHS  . amLODipine  10 mg Oral Daily  . aspirin EC  81 mg Oral q morning - 10a  . atorvastatin  20 mg Oral QHS  . budesonide (PULMICORT) nebulizer solution  0.25 mg Nebulization BID  . calcium-vitamin D  1 tablet Oral q morning - 10a  . carvedilol  25 mg Oral BID WC  . enoxaparin (LOVENOX) injection  40 mg Subcutaneous Q24H  . furosemide  40 mg Oral BID  . insulin aspart  0-9 Units Subcutaneous TID WC  . ipratropium-albuterol  3 mL Nebulization TID  . losartan  50 mg Oral QHS  . methylPREDNISolone (SOLU-MEDROL) injection  40 mg Intravenous Q12H  . multivitamin with minerals  1 tablet Oral Daily  . pantoprazole  40 mg Oral q morning - 10a  . tamsulosin  0.4 mg Oral QPC supper   Continuous Infusions: . azithromycin 500 mg (09/14/19 1749)  .  cefTRIAXone (ROCEPHIN)  IV 2 g (09/14/19 1625)     LOS: 2 days   Rickey Barbara, MD [facility] Pager On Amion  If 7PM-7AM, please contact night-coverage 09/15/2019, 3:59 PM

## 2019-09-16 DIAGNOSIS — J9601 Acute respiratory failure with hypoxia: Secondary | ICD-10-CM | POA: Diagnosis not present

## 2019-09-16 DIAGNOSIS — I480 Paroxysmal atrial fibrillation: Secondary | ICD-10-CM | POA: Diagnosis not present

## 2019-09-16 DIAGNOSIS — J441 Chronic obstructive pulmonary disease with (acute) exacerbation: Secondary | ICD-10-CM | POA: Diagnosis not present

## 2019-09-16 DIAGNOSIS — J189 Pneumonia, unspecified organism: Secondary | ICD-10-CM | POA: Diagnosis not present

## 2019-09-16 LAB — GLUCOSE, CAPILLARY
Glucose-Capillary: 204 mg/dL — ABNORMAL HIGH (ref 70–99)
Glucose-Capillary: 288 mg/dL — ABNORMAL HIGH (ref 70–99)
Glucose-Capillary: 229 mg/dL — ABNORMAL HIGH (ref 70–99)
Glucose-Capillary: 240 mg/dL — ABNORMAL HIGH (ref 70–99)

## 2019-09-16 MED ORDER — METHYLPREDNISOLONE SODIUM SUCC 125 MG IJ SOLR
60.0000 mg | Freq: Three times a day (TID) | INTRAMUSCULAR | Status: DC
  Administered 2019-09-16 – 2019-09-17 (×3): 60 mg via INTRAVENOUS
  Filled 2019-09-16 (×4): qty 2

## 2019-09-16 MED ORDER — INSULIN GLARGINE 100 UNIT/ML ~~LOC~~ SOLN
10.0000 [IU] | Freq: Every day | SUBCUTANEOUS | Status: DC
  Administered 2019-09-16 – 2019-09-19 (×4): 10 [IU] via SUBCUTANEOUS
  Filled 2019-09-16 (×4): qty 0.1

## 2019-09-16 NOTE — Progress Notes (Signed)
PROGRESS NOTE    Bradley SKEELS Sr.  GYJ:[id number] DOB: 07-13-1941 DOA: 09/13/2019 PCP: Colon Branch, MD    Brief Narrative:  78 y.o. male with history of atrial fibrillation status post AV nodal ablation and pacemaker placement, diastolic CHF, COPD, CAD and history of intracranial hemorrhage not on anticoagulation for A. fib presents to the ER because of increasing shortness of breath.  Patient states he has been having increasing wheezing and shortness of breath last 2 days.  Has been a nonproductive cough with chest pain on coughing.  Denies any fever chills or sick contacts.  Assessment & Plan:   Principal Problem:   Acute respiratory failure (HCC) Active Problems:   DM II (diabetes mellitus, type II), controlled ([place])   Hyperlipidemia   Essential hypertension   Permanent atrial fibrillation   PAD (peripheral artery disease) (HCC)   Non-ischemic cardiomyopathy (HCC)   Sinoatrial node dysfunction (HCC)   Pacemaker-Medtronic   COPD with acute exacerbation ([place])   CAP (community acquired pneumonia)   Acute respiratory failure with hypoxia ([place])   1. Acute respiratory failure with hypoxia secondary to COPD exacerbation with pneumonia 1. Pt is continued on scheduled IV solumedrol and neb tx 2. Continue to wean O2 as tolerated 3. Despite O2 requirements improved to [facility], patient remains with marked wheezing in all lung fields decreased BS, and appears sob while sitting upright at side of bed, breathing with pursed lips 4. Currently on 60mg  BID solumedrol. Will increase to TID dosing 2. Community-acquired pneumonia 1. COVID is neg 2. Pt is continued on empiric ceftriaxone and Zithromax.  3. Stable at this time 3. Diabetes mellitus type 2 with hyperglycemia 1. Continued on SSI coverage 2. Glucose now in the 200's in setting of high dose steroids 3. Will start 10 units lantus while on IV steroids 4.  we will keep patient on sliding scale coverage while in hospital 4. A. fib status  post AV nodal ablation and biventricular pacemaker placement  1. Pt is not on anticoagulation secondary history of intracranial hemorrhage  2. Pt is continued on carvedilol for rate control, stable at this time 5. Hypertension 1. BP remains stable currently  2. Will continue on carvedilol ARB and amlodipine. 6. History of CAD 1. Pt continued on aspirin statins and carvedilol.   2. Remains stable currently  7. History of chronic diastolic dysfunction  1. Currently appears to be compensated.  DVT prophylaxis: Lovenox subq Code Status: Full Family Communication: Pt in room, family not at bedside Disposition Plan: Uncertain at this time  Consultants:     Procedures:     Antimicrobials: Anti-infectives (From admission, onward)   Start     Dose/Rate Route Frequency Ordered Stop   09/14/19 1800  cefTRIAXone (ROCEPHIN) 2 g in sodium chloride 0.9 % 100 mL IVPB     2 g 200 mL/hr over 30 Minutes Intravenous Every 24 hours 09/13/19 2242 09/19/19 1759   09/14/19 1800  azithromycin (ZITHROMAX) 500 mg in sodium chloride 0.9 % 250 mL IVPB     500 mg 250 mL/hr over 60 Minutes Intravenous Every 24 hours 09/13/19 2242 09/19/19 1759   09/13/19 2030  cefTRIAXone (ROCEPHIN) 1 g in sodium chloride 0.9 % 100 mL IVPB     1 g 200 mL/hr over 30 Minutes Intravenous  Once 09/13/19 2021 09/13/19 2135   09/13/19 2030  azithromycin (ZITHROMAX) tablet 500 mg     500 mg Oral  Once 09/13/19 2021 09/13/19 2129      Subjective:  Still reporting sob and wheezing  Objective: Vitals:   09/15/19 2108 09/16/19 0558 09/16/19 0810 09/16/19 0825  BP: 133/78 (!) 147/69 126/70   Pulse: 77 82    Resp:      Temp: (!) 97.5 F (36.4 C) (!) 97.5 F (36.4 C)    TempSrc: Oral Oral    SpO2: 95% 98% 98% 97%  Weight:  92.8 kg    Height:        Intake/Output Summary (Last 24 hours) at 09/16/2019 1422 Last data filed at 09/16/2019 1200 Gross per 24 hour  Intake 720 ml  Output 2825 ml  Net -2105 ml   Filed  Weights   09/14/19 1300 09/15/19 0521 09/16/19 0558  Weight: 91.6 kg 92.6 kg 92.8 kg    Examination: General exam: Conversant, appears sob Respiratory system: normal chest rise, decreased BS, wheezing in all fields, pursed lips Cardiovascular system: regular rhythm, s1-s2 Gastrointestinal system: Nondistended, nontender, pos BS Central nervous system: No seizures, no tremors Extremities: No cyanosis, no joint deformities Skin: No rashes, no pallor Psychiatry: Affect normal // no auditory hallucinations   Data Reviewed: I have personally reviewed following labs and imaging studies  CBC: Recent Labs  Lab 09/13/19 1603 09/13/19 2334 09/14/19 0359  WBC 8.2 10.1 6.8  NEUTROABS 6.3  --   --   HGB 13.7 14.2 14.3  HCT 41.4 41.9 42.3  MCV 93.2 90.3 91.4  PLT 190 182 187   Basic Metabolic Panel: Recent Labs  Lab 09/13/19 1603 09/13/19 2334 09/14/19 0359  NA 138  --  137  K 4.4  --  4.2  CL 99  --  99  CO2 30  --  27  GLUCOSE 128*  --  243*  BUN 12  --  10  CREATININE 0.85 0.84 0.80  CALCIUM 8.3*  --  8.9   GFR: Estimated Creatinine Clearance: 85.6 mL/min (by C-G formula based on SCr of 0.8 mg/dL). Liver Function Tests: Recent Labs  Lab 09/13/19 1603  AST 27  ALT 16  ALKPHOS 91  BILITOT 0.8  PROT 6.7  ALBUMIN 3.5   No results for input(s): LIPASE, AMYLASE in the last 168 hours. No results for input(s): AMMONIA in the last 168 hours. Coagulation Profile: No results for input(s): INR, PROTIME in the last 168 hours. Cardiac Enzymes: No results for input(s): CKTOTAL, CKMB, CKMBINDEX, TROPONINI in the last 168 hours. BNP (last 3 results) No results for input(s): PROBNP in the last 8760 hours. HbA1C: No results for input(s): HGBA1C in the last 72 hours. CBG: Recent Labs  Lab 09/15/19 1146 09/15/19 1626 09/15/19 2106 09/16/19 0726 09/16/19 1123  GLUCAP 284* 250* 224* 204* 288*   Lipid Profile: No results for input(s): CHOL, HDL, LDLCALC, TRIG, CHOLHDL,  LDLDIRECT in the last 72 hours. Thyroid Function Tests: No results for input(s): TSH, T4TOTAL, FREET4, T3FREE, THYROIDAB in the last 72 hours. Anemia Panel: No results for input(s): VITAMINB12, FOLATE, FERRITIN, TIBC, IRON, RETICCTPCT in the last 72 hours. Sepsis Labs: No results for input(s): PROCALCITON, LATICACIDVEN in the last 168 hours.  Recent Results (from the past 240 hour(s))  SARS CORONAVIRUS 2 (TAT 6-24 HRS) Nasopharyngeal Nasopharyngeal Swab     Status: None   Collection Time: 09/13/19  7:04 PM   Specimen: Nasopharyngeal Swab  Result Value Ref Range Status   SARS Coronavirus 2 NEGATIVE NEGATIVE Final    Comment: (NOTE) SARS-CoV-2 target nucleic acids are NOT DETECTED. The SARS-CoV-2 RNA is generally detectable in upper and lower respiratory specimens  during the acute phase of infection. Negative results do not preclude SARS-CoV-2 infection, do not rule out co-infections with other pathogens, and should not be used as the sole basis for treatment or other patient management decisions. Negative results must be combined with clinical observations, patient history, and epidemiological information. The expected result is Negative. Fact Sheet for Patients: HairSlick.no Fact Sheet for Healthcare Providers: quierodirigir.com This test is not yet approved or cleared by the [place] FDA and  has been authorized for detection and/or diagnosis of SARS-CoV-2 by FDA under an Emergency Use Authorization (EUA). This EUA will remain  in effect (meaning this test can be used) for the duration of the COVID-19 declaration under Section 56 4(b)(1) of the Act, 21 U.S.C. section 360bbb-3(b)(1), unless the authorization is terminated or revoked sooner. Performed at [facility], [address], [place], [place] [address]      Radiology Studies: No results found.  Scheduled Meds: . acetaminophen  500 mg Oral QHS   And   . diphenhydrAMINE  25 mg Oral QHS  . amLODipine  10 mg Oral Daily  . aspirin EC  81 mg Oral q morning - 10a  . atorvastatin  20 mg Oral QHS  . budesonide (PULMICORT) nebulizer solution  0.25 mg Nebulization BID  . calcium-vitamin D  1 tablet Oral q morning - 10a  . carvedilol  25 mg Oral BID WC  . enoxaparin (LOVENOX) injection  40 mg Subcutaneous Q24H  . furosemide  40 mg Oral BID  . insulin aspart  0-9 Units Subcutaneous TID WC  . ipratropium-albuterol  3 mL Nebulization TID  . losartan  50 mg Oral QHS  . methylPREDNISolone (SOLU-MEDROL) injection  60 mg Intravenous Q8H  . multivitamin with minerals  1 tablet Oral Daily  . pantoprazole  40 mg Oral q morning - 10a  . tamsulosin  0.4 mg Oral QPC supper   Continuous Infusions: . azithromycin 500 mg (09/15/19 1907)  . cefTRIAXone (ROCEPHIN)  IV 2 g (09/15/19 1808)     LOS: 3 days   Rickey Barbara, MD [facility] Pager On Amion  If 7PM-7AM, please contact night-coverage 09/16/2019, 2:22 PM

## 2019-09-17 DIAGNOSIS — I480 Paroxysmal atrial fibrillation: Secondary | ICD-10-CM | POA: Diagnosis not present

## 2019-09-17 DIAGNOSIS — J441 Chronic obstructive pulmonary disease with (acute) exacerbation: Secondary | ICD-10-CM | POA: Diagnosis not present

## 2019-09-17 DIAGNOSIS — J189 Pneumonia, unspecified organism: Secondary | ICD-10-CM | POA: Diagnosis not present

## 2019-09-17 DIAGNOSIS — J9601 Acute respiratory failure with hypoxia: Secondary | ICD-10-CM | POA: Diagnosis not present

## 2019-09-17 LAB — GLUCOSE, CAPILLARY
Glucose-Capillary: 307 mg/dL — ABNORMAL HIGH (ref 70–99)
Glucose-Capillary: 263 mg/dL — ABNORMAL HIGH (ref 70–99)
Glucose-Capillary: 198 mg/dL — ABNORMAL HIGH (ref 70–99)
Glucose-Capillary: 250 mg/dL — ABNORMAL HIGH (ref 70–99)

## 2019-09-17 MED ORDER — METHYLPREDNISOLONE SODIUM SUCC 125 MG IJ SOLR
60.0000 mg | Freq: Two times a day (BID) | INTRAMUSCULAR | Status: DC
  Administered 2019-09-17 – 2019-09-18 (×2): 60 mg via INTRAVENOUS
  Filled 2019-09-17 (×2): qty 2

## 2019-09-17 MED ORDER — FUROSEMIDE 10 MG/ML IJ SOLN
40.0000 mg | Freq: Once | INTRAMUSCULAR | Status: AC
  Administered 2019-09-17: 40 mg via INTRAVENOUS
  Filled 2019-09-17: qty 4

## 2019-09-17 MED ORDER — FUROSEMIDE 40 MG PO TABS
40.0000 mg | ORAL_TABLET | Freq: Two times a day (BID) | ORAL | Status: DC
  Administered 2019-09-18 – 2019-09-19 (×3): 40 mg via ORAL
  Filled 2019-09-17 (×3): qty 1

## 2019-09-17 MED ORDER — AZITHROMYCIN 250 MG PO TABS
500.0000 mg | ORAL_TABLET | ORAL | Status: AC
  Administered 2019-09-17 – 2019-09-18 (×2): 500 mg via ORAL
  Filled 2019-09-17 (×2): qty 2

## 2019-09-17 NOTE — Care Management Important Message (Signed)
Important Message  Patient Details  Name: Bradley CHAGOYA Sr. MRN: [id number] Date of Birth: March 24, 1941   Medicare Important Message Given:  Yes     [name] 09/17/2019, 2:58 PM

## 2019-09-17 NOTE — Progress Notes (Signed)
PROGRESS NOTE    Bradley FLINK Sr.  ZWC:[id number] DOB: October 17, 1940 DOA: 09/13/2019 PCP: Bradley Plump, MD    Brief Narrative:  78 y.o. male with history of atrial fibrillation status post AV nodal ablation and pacemaker placement, diastolic CHF, COPD, CAD and history of intracranial hemorrhage not on anticoagulation for A. fib presents to the ER because of increasing shortness of breath.  Patient states he has been having increasing wheezing and shortness of breath last 2 days.  Has been a nonproductive cough with chest pain on coughing.  Denies any fever chills or sick contacts.  Assessment & Plan:   Principal Problem:   Acute respiratory failure (HCC) Active Problems:   DM II (diabetes mellitus, type II), controlled (HCC)   Hyperlipidemia   Essential hypertension   Permanent atrial fibrillation   PAD (peripheral artery disease) (HCC)   Non-ischemic cardiomyopathy (HCC)   Sinoatrial node dysfunction (HCC)   Pacemaker-Medtronic   COPD with acute exacerbation (HCC)   CAP (community acquired pneumonia)   Acute respiratory failure with hypoxia (HCC)   1. Acute respiratory failure with hypoxia secondary to COPD exacerbation with pneumonia 1. Pt is continued on scheduled IV solumedrol and neb tx 2. Continue to wean O2 as tolerated 3. Pt is continued on 3LNC. Baseline is 2-3LNC 4. Pt had marked wheezing recently, requiring increasing doses of IV steroids 5. Wheezing now improved and air movement has improved 6. Will wean solumedrol to 60mg  IV BID 2. Community-acquired pneumonia 1. COVID is neg 2. Pt is continued on empiric ceftriaxone and Zithromax.  3. Remains stable 3. Diabetes mellitus type 2 with hyperglycemia 1. Continued on SSI coverage 2. Blood glucose noted to be in the 200's in setting of high dose steroids 3. Now on 10 units lantus while on IV steroids 4. Glucose improving 4. A. fib status post AV nodal ablation and biventricular pacemaker placement  1. Pt is not on  anticoagulation secondary history of intracranial hemorrhage  2. Pt is continued on carvedilol for rate control, stable at this time 5. Hypertension 1. BP remains stable currently  2. Will continue on carvedilol ARB and amlodipine. 6. History of CAD 1. Pt continued on aspirin statins and carvedilol.   2. Remains stable currently  7. History of chronic diastolic dysfunction  1. Currently appears to be compensated.  DVT prophylaxis: Lovenox subq Code Status: Full Family Communication: Pt in room, family not at bedside Disposition Plan: Uncertain at this time  Consultants:     Procedures:     Antimicrobials: Anti-infectives (From admission, onward)   Start     Dose/Rate Route Frequency Ordered Stop   09/17/19 1600  azithromycin (ZITHROMAX) tablet 500 mg     500 mg Oral Every 24 hours 09/17/19 1356 09/19/19 1559   09/14/19 1800  cefTRIAXone (ROCEPHIN) 2 g in sodium chloride 0.9 % 100 mL IVPB     2 g 200 mL/hr over 30 Minutes Intravenous Every 24 hours 09/13/19 2242 09/19/19 1759   09/14/19 1800  azithromycin (ZITHROMAX) 500 mg in sodium chloride 0.9 % 250 mL IVPB  Status:  Discontinued     500 mg 250 mL/hr over 60 Minutes Intravenous Every 24 hours 09/13/19 2242 09/17/19 1355   09/13/19 2030  cefTRIAXone (ROCEPHIN) 1 g in sodium chloride 0.9 % 100 mL IVPB     1 g 200 mL/hr over 30 Minutes Intravenous  Once 09/13/19 2021 09/13/19 2135   09/13/19 2030  azithromycin (ZITHROMAX) tablet 500 mg     500  mg Oral  Once 09/13/19 2021 09/13/19 2129      Subjective: Reports breathing better today  Objective: Vitals:   09/16/19 2056 09/17/19 0600 09/17/19 0800 09/17/19 1407  BP: 132/74 138/77  134/80  Pulse: 77 70  85  Resp:      Temp: 98 F (36.7 C) (!) 97.5 F (36.4 C)  97.6 F (36.4 C)  TempSrc: Oral Oral  Oral  SpO2: 93% 93% 92% 96%  Weight:  92.3 kg    Height:        Intake/Output Summary (Last 24 hours) at 09/17/2019 1637 Last data filed at 09/17/2019 1300 Gross  per 24 hour  Intake 950 ml  Output 2125 ml  Net -1175 ml   Filed Weights   09/15/19 0521 09/16/19 0558 09/17/19 0600  Weight: 92.6 kg 92.8 kg 92.3 kg    Examination: General exam: Awake, laying in bed, in nad Respiratory system: Somewhat increased respiratory effort, trace wheezing Cardiovascular system: regular rate, s1, s2 Gastrointestinal system: Soft, nondistended, positive BS Central nervous system: CN2-12 grossly intact, strength intact Extremities: Perfused, no clubbing Skin: Normal skin turgor, no notable skin lesions seen Psychiatry: Mood normal // no visual hallucinations   Data Reviewed: I have personally reviewed following labs and imaging studies  CBC: Recent Labs  Lab 09/13/19 1603 09/13/19 2334 09/14/19 0359  WBC 8.2 10.1 6.8  NEUTROABS 6.3  --   --   HGB 13.7 14.2 14.3  HCT 41.4 41.9 42.3  MCV 93.2 90.3 91.4  PLT 190 182 856   Basic Metabolic Panel: Recent Labs  Lab 09/13/19 1603 09/13/19 2334 09/14/19 0359  NA 138  --  137  K 4.4  --  4.2  CL 99  --  99  CO2 30  --  27  GLUCOSE 128*  --  243*  BUN 12  --  10  CREATININE 0.85 0.84 0.80  CALCIUM 8.3*  --  8.9   GFR: Estimated Creatinine Clearance: 85.4 mL/min (by C-G formula based on SCr of 0.8 mg/dL). Liver Function Tests: Recent Labs  Lab 09/13/19 1603  AST 27  ALT 16  ALKPHOS 91  BILITOT 0.8  PROT 6.7  ALBUMIN 3.5   No results for input(s): LIPASE, AMYLASE in the last 168 hours. No results for input(s): AMMONIA in the last 168 hours. Coagulation Profile: No results for input(s): INR, PROTIME in the last 168 hours. Cardiac Enzymes: No results for input(s): CKTOTAL, CKMB, CKMBINDEX, TROPONINI in the last 168 hours. BNP (last 3 results) No results for input(s): PROBNP in the last 8760 hours. HbA1C: No results for input(s): HGBA1C in the last 72 hours. CBG: Recent Labs  Lab 09/16/19 1123 09/16/19 1621 09/16/19 2054 09/17/19 0738 09/17/19 1119  GLUCAP 288* 229* 240* 307*  263*   Lipid Profile: No results for input(s): CHOL, HDL, LDLCALC, TRIG, CHOLHDL, LDLDIRECT in the last 72 hours. Thyroid Function Tests: No results for input(s): TSH, T4TOTAL, FREET4, T3FREE, THYROIDAB in the last 72 hours. Anemia Panel: No results for input(s): VITAMINB12, FOLATE, FERRITIN, TIBC, IRON, RETICCTPCT in the last 72 hours. Sepsis Labs: No results for input(s): PROCALCITON, LATICACIDVEN in the last 168 hours.  Recent Results (from the past 240 hour(s))  SARS CORONAVIRUS 2 (TAT 6-24 HRS) Nasopharyngeal Nasopharyngeal Swab     Status: None   Collection Time: 09/13/19  7:04 PM   Specimen: Nasopharyngeal Swab  Result Value Ref Range Status   SARS Coronavirus 2 NEGATIVE NEGATIVE Final    Comment: (NOTE) SARS-CoV-2 target nucleic  acids are NOT DETECTED. The SARS-CoV-2 RNA is generally detectable in upper and lower respiratory specimens during the acute phase of infection. Negative results do not preclude SARS-CoV-2 infection, do not rule out co-infections with other pathogens, and should not be used as the sole basis for treatment or other patient management decisions. Negative results must be combined with clinical observations, patient history, and epidemiological information. The expected result is Negative. Fact Sheet for Patients: HairSlick.no Fact Sheet for Healthcare Providers: quierodirigir.com This test is not yet approved or cleared by the [place] FDA and  has been authorized for detection and/or diagnosis of SARS-CoV-2 by FDA under an Emergency Use Authorization (EUA). This EUA will remain  in effect (meaning this test can be used) for the duration of the COVID-19 declaration under Section 56 4(b)(1) of the Act, 21 U.S.C. section 360bbb-3(b)(1), unless the authorization is terminated or revoked sooner. Performed at [facility], [address], [place], [place] [address]      Radiology  Studies: No results found.  Scheduled Meds: . acetaminophen  500 mg Oral QHS   And  . diphenhydrAMINE  25 mg Oral QHS  . amLODipine  10 mg Oral Daily  . aspirin EC  81 mg Oral q morning - 10a  . atorvastatin  20 mg Oral QHS  . azithromycin  500 mg Oral Q24H  . budesonide (PULMICORT) nebulizer solution  0.25 mg Nebulization BID  . calcium-vitamin D  1 tablet Oral q morning - 10a  . carvedilol  25 mg Oral BID WC  . enoxaparin (LOVENOX) injection  40 mg Subcutaneous Q24H  . furosemide  40 mg Intravenous Once  . [START ON 09/18/2019] furosemide  40 mg Oral BID  . insulin aspart  0-9 Units Subcutaneous TID WC  . insulin glargine  10 Units Subcutaneous Daily  . ipratropium-albuterol  3 mL Nebulization TID  . losartan  50 mg Oral QHS  . methylPREDNISolone (SOLU-MEDROL) injection  60 mg Intravenous Q12H  . multivitamin with minerals  1 tablet Oral Daily  . pantoprazole  40 mg Oral q morning - 10a  . tamsulosin  0.4 mg Oral QPC supper   Continuous Infusions: . cefTRIAXone (ROCEPHIN)  IV 2 g (09/16/19 1837)     LOS: 4 days   Rickey Barbara, MD [facility] Pager On Amion  If 7PM-7AM, please contact night-coverage 09/17/2019, 4:37 PM

## 2019-09-18 DIAGNOSIS — I10 Essential (primary) hypertension: Secondary | ICD-10-CM

## 2019-09-18 DIAGNOSIS — J189 Pneumonia, unspecified organism: Secondary | ICD-10-CM | POA: Diagnosis not present

## 2019-09-18 DIAGNOSIS — I480 Paroxysmal atrial fibrillation: Secondary | ICD-10-CM | POA: Diagnosis not present

## 2019-09-18 LAB — COMPREHENSIVE METABOLIC PANEL
ALT: 25 U/L (ref 0–44)
AST: 19 U/L (ref 15–41)
Albumin: 2.8 g/dL — ABNORMAL LOW (ref 3.5–5.0)
Alkaline Phosphatase: 59 U/L (ref 38–126)
Anion gap: 10 (ref 5–15)
BUN: 23 mg/dL (ref 8–23)
CO2: 33 mmol/L — ABNORMAL HIGH (ref 22–32)
Calcium: 8.4 mg/dL — ABNORMAL LOW (ref 8.9–10.3)
Chloride: 95 mmol/L — ABNORMAL LOW (ref 98–111)
Creatinine, Ser: 0.81 mg/dL (ref 0.61–1.24)
GFR calc Af Amer: >60 mL/min (ref >60)
GFR calc non Af Amer: >60 mL/min (ref >60)
Glucose, Bld: 279 mg/dL — ABNORMAL HIGH (ref 70–99)
Potassium: 4 mmol/L (ref 3.5–5.1)
Sodium: 138 mmol/L (ref 135–145)
Total Bilirubin: 0.6 mg/dL (ref 0.3–1.2)
Total Protein: 5.6 g/dL — ABNORMAL LOW (ref 6.5–8.1)

## 2019-09-18 LAB — GLUCOSE, CAPILLARY
Glucose-Capillary: 336 mg/dL — ABNORMAL HIGH (ref 70–99)
Glucose-Capillary: 244 mg/dL — ABNORMAL HIGH (ref 70–99)
Glucose-Capillary: 209 mg/dL — ABNORMAL HIGH (ref 70–99)

## 2019-09-18 MED ORDER — METHYLPREDNISOLONE SODIUM SUCC 125 MG IJ SOLR
60.0000 mg | INTRAMUSCULAR | Status: DC
  Filled 2019-09-18: qty 2

## 2019-09-18 NOTE — Progress Notes (Signed)
PROGRESS NOTE    Bradley SETO Sr.  WCH:[id number] DOB: [birth year] DOA: 09/13/2019 PCP: [name], MD    Brief Narrative:  78 y.o. male with history of atrial fibrillation status post AV nodal ablation and pacemaker placement, diastolic CHF, COPD, CAD and history of intracranial hemorrhage not on anticoagulation for A. fib presents to the ER because of increasing shortness of breath.  Patient states he has been having increasing wheezing and shortness of breath last 2 days.  Has been a nonproductive cough with chest pain on coughing.  Denies any fever chills or sick contacts.  Assessment & Plan:   Principal Problem:   Acute respiratory failure (HCC) Active Problems:   DM II (diabetes mellitus, type II), controlled (HCC)   Hyperlipidemia   Essential hypertension   Permanent atrial fibrillation   PAD (peripheral artery disease) (HCC)   Non-ischemic cardiomyopathy (HCC)   Sinoatrial node dysfunction (HCC)   Pacemaker-[employer]   COPD with acute exacerbation (HCC)   CAP (community acquired pneumonia)   Acute respiratory failure with hypoxia (HCC)   1. Acute respiratory failure with hypoxia secondary to COPD exacerbation with pneumonia 1. Pt is continued on scheduled IV solumedrol and neb tx 2. Continue to wean O2 as tolerated 3. Pt is continued on 3LNC. Baseline is 2-3LNC 4. Pt had marked wheezing recently, requiring increasing doses of IV steroids 5. Wheezing improved and pt now on 2LNC 6. Will wean solumedrol to 60mg  IV daily 2. Community-acquired pneumonia 1. COVID is neg 2. Pt is continued on empiric ceftriaxone and Zithromax. Last dose is on 12/9 to complete 5 days of tx 3. Stable 3. Diabetes mellitus type 2 with hyperglycemia 1. Continued on SSI coverage 2. Blood glucose noted to be in the 200's in setting of high dose steroids 3. Now on 10 units lantus while on IV steroids 4. Glucose improved 4. A. fib status post AV nodal ablation and biventricular pacemaker  placement  1. Pt is not on anticoagulation secondary history of intracranial hemorrhage  2. Pt is continued on carvedilol for rate control, stable at this time 5. Hypertension 1. BP remains stable at this time 2. Will continue on carvedilol ARB and amlodipine. 6. History of CAD 1. Pt continued on aspirin statins and carvedilol.   2. Remains stable currently  7. History of chronic diastolic dysfunction  1. Appears compensated  DVT prophylaxis: Lovenox subq Code Status: Full Family Communication: Pt in room, family not at bedside Disposition Plan: Possible d/c home in 24hrs  Consultants:     Procedures:     Antimicrobials: Anti-infectives (From admission, onward)   Start     Dose/Rate Route Frequency Ordered Stop   09/17/19 1600  azithromycin (ZITHROMAX) tablet 500 mg     500 mg Oral Every 24 hours 09/17/19 1356 09/19/19 1559   09/14/19 1800  cefTRIAXone (ROCEPHIN) 2 g in sodium chloride 0.9 % 100 mL IVPB     2 g 200 mL/hr over 30 Minutes Intravenous Every 24 hours 09/13/19 2242 09/19/19 1759   09/14/19 1800  azithromycin (ZITHROMAX) 500 mg in sodium chloride 0.9 % 250 mL IVPB  Status:  Discontinued     500 mg 250 mL/hr over 60 Minutes Intravenous Every 24 hours 09/13/19 2242 09/17/19 1355   09/13/19 2030  cefTRIAXone (ROCEPHIN) 1 g in sodium chloride 0.9 % 100 mL IVPB     1 g 200 mL/hr over 30 Minutes Intravenous  Once 09/13/19 2021 09/13/19 2135   09/13/19 2030  azithromycin (ZITHROMAX) tablet  500 mg     500 mg Oral  Once 09/13/19 2021 09/13/19 2129      Subjective: States feeling better today  Objective: Vitals:   09/18/19 0544 09/18/19 0841 09/18/19 1439 09/18/19 1505  BP: 131/84 (!) 143/87  125/76  Pulse:  83  72  Resp:  18    Temp: 97.8 F (36.6 C) 97.6 F (36.4 C)  97.8 F (36.6 C)  TempSrc: Oral Axillary  Oral  SpO2: 95% 97% 92% 95%  Weight: 93.1 kg     Height:        Intake/Output Summary (Last 24 hours) at 09/18/2019 1550 Last data filed at  09/18/2019 1134 Gross per 24 hour  Intake 300 ml  Output 3075 ml  Net -2775 ml   Filed Weights   09/16/19 0558 09/17/19 0600 09/18/19 0544  Weight: 92.8 kg 92.3 kg 93.1 kg    Examination: General exam: Conversant, in no acute distress Respiratory system: normal chest rise, clear, normal resp effort Cardiovascular system: regular rhythm, s1-s2 Gastrointestinal system: Nondistended, nontender, pos BS Central nervous system: No seizures, no tremors Extremities: No cyanosis, no joint deformities Skin: No rashes, no pallor Psychiatry: Affect normal // no auditory hallucinations   Data Reviewed: I have personally reviewed following labs and imaging studies  CBC: Recent Labs  Lab 09/13/19 1603 09/13/19 2334 09/14/19 0359  WBC 8.2 10.1 6.8  NEUTROABS 6.3  --   --   HGB 13.7 14.2 14.3  HCT 41.4 41.9 42.3  MCV 93.2 90.3 91.4  PLT 190 182 725   Basic Metabolic Panel: Recent Labs  Lab 09/13/19 1603 09/13/19 2334 09/14/19 0359 09/18/19 0353  NA 138  --  137 138  K 4.4  --  4.2 4.0  CL 99  --  99 95*  CO2 30  --  27 33*  GLUCOSE 128*  --  243* 279*  BUN 12  --  10 23  CREATININE 0.85 0.84 0.80 0.81  CALCIUM 8.3*  --  8.9 8.4*   GFR: Estimated Creatinine Clearance: 84.7 mL/min (by C-G formula based on SCr of 0.81 mg/dL). Liver Function Tests: Recent Labs  Lab 09/13/19 1603 09/18/19 0353  AST 27 19  ALT 16 25  ALKPHOS 91 59  BILITOT 0.8 0.6  PROT 6.7 5.6*  ALBUMIN 3.5 2.8*   No results for input(s): LIPASE, AMYLASE in the last 168 hours. No results for input(s): AMMONIA in the last 168 hours. Coagulation Profile: No results for input(s): INR, PROTIME in the last 168 hours. Cardiac Enzymes: No results for input(s): CKTOTAL, CKMB, CKMBINDEX, TROPONINI in the last 168 hours. BNP (last 3 results) No results for input(s): PROBNP in the last 8760 hours. HbA1C: No results for input(s): HGBA1C in the last 72 hours. CBG: Recent Labs  Lab 09/17/19 1119 09/17/19  1643 09/17/19 2143 09/18/19 0756 09/18/19 1109  GLUCAP 263* 198* 250* 336* 244*   Lipid Profile: No results for input(s): CHOL, HDL, LDLCALC, TRIG, CHOLHDL, LDLDIRECT in the last 72 hours. Thyroid Function Tests: No results for input(s): TSH, T4TOTAL, FREET4, T3FREE, THYROIDAB in the last 72 hours. Anemia Panel: No results for input(s): VITAMINB12, FOLATE, FERRITIN, TIBC, IRON, RETICCTPCT in the last 72 hours. Sepsis Labs: No results for input(s): PROCALCITON, LATICACIDVEN in the last 168 hours.  Recent Results (from the past 240 hour(s))  SARS CORONAVIRUS 2 (TAT 6-24 HRS) Nasopharyngeal Nasopharyngeal Swab     Status: None   Collection Time: 09/13/19  7:04 PM   Specimen: Nasopharyngeal Swab  Result Value Ref Range Status   SARS Coronavirus 2 NEGATIVE NEGATIVE Final    Comment: (NOTE) SARS-CoV-2 target nucleic acids are NOT DETECTED. The SARS-CoV-2 RNA is generally detectable in upper and lower respiratory specimens during the acute phase of infection. Negative results do not preclude SARS-CoV-2 infection, do not rule out co-infections with other pathogens, and should not be used as the sole basis for treatment or other patient management decisions. Negative results must be combined with clinical observations, patient history, and epidemiological information. The expected result is Negative. Fact Sheet for Patients: HairSlick.no Fact Sheet for Healthcare Providers: quierodirigir.com This test is not yet approved or cleared by the [place] FDA and  has been authorized for detection and/or diagnosis of SARS-CoV-2 by FDA under an Emergency Use Authorization (EUA). This EUA will remain  in effect (meaning this test can be used) for the duration of the COVID-19 declaration under Section 56 4(b)(1) of the Act, 21 U.S.C. section 360bbb-3(b)(1), unless the authorization is terminated or revoked sooner. Performed at [facility], [address] [place], [place] [address]      Radiology Studies: No results found.  Scheduled Meds: . acetaminophen  500 mg Oral QHS   And  . diphenhydrAMINE  25 mg Oral QHS  . amLODipine  10 mg Oral Daily  . aspirin EC  81 mg Oral q morning - 10a  . atorvastatin  20 mg Oral QHS  . azithromycin  500 mg Oral Q24H  . budesonide (PULMICORT) nebulizer solution  0.25 mg Nebulization BID  . calcium-vitamin D  1 tablet Oral q morning - 10a  . carvedilol  25 mg Oral BID WC  . enoxaparin (LOVENOX) injection  40 mg Subcutaneous Q24H  . furosemide  40 mg Oral BID  . insulin aspart  0-9 Units Subcutaneous TID WC  . insulin glargine  10 Units Subcutaneous Daily  . ipratropium-albuterol  3 mL Nebulization TID  . losartan  50 mg Oral QHS  . methylPREDNISolone (SOLU-MEDROL) injection  60 mg Intravenous Q12H  . multivitamin with minerals  1 tablet Oral Daily  . pantoprazole  40 mg Oral q morning - 10a  . tamsulosin  0.4 mg Oral QPC supper   Continuous Infusions: . cefTRIAXone (ROCEPHIN)  IV 2 g (09/17/19 1707)     LOS: 5 days   Rickey Barbara, MD [facility] Pager On Amion  If 7PM-7AM, please contact night-coverage 09/18/2019, 3:49 PM

## 2019-09-19 DIAGNOSIS — E118 Type 2 diabetes mellitus with unspecified complications: Secondary | ICD-10-CM

## 2019-09-19 DIAGNOSIS — E785 Hyperlipidemia, unspecified: Secondary | ICD-10-CM

## 2019-09-19 DIAGNOSIS — I480 Paroxysmal atrial fibrillation: Secondary | ICD-10-CM | POA: Diagnosis not present

## 2019-09-19 DIAGNOSIS — I495 Sick sinus syndrome: Secondary | ICD-10-CM

## 2019-09-19 DIAGNOSIS — J189 Pneumonia, unspecified organism: Secondary | ICD-10-CM | POA: Diagnosis not present

## 2019-09-19 DIAGNOSIS — J441 Chronic obstructive pulmonary disease with (acute) exacerbation: Secondary | ICD-10-CM | POA: Diagnosis not present

## 2019-09-19 DIAGNOSIS — Z950 Presence of cardiac pacemaker: Secondary | ICD-10-CM

## 2019-09-19 DIAGNOSIS — J9601 Acute respiratory failure with hypoxia: Secondary | ICD-10-CM | POA: Diagnosis not present

## 2019-09-19 DIAGNOSIS — I739 Peripheral vascular disease, unspecified: Secondary | ICD-10-CM

## 2019-09-19 LAB — GLUCOSE, CAPILLARY
Glucose-Capillary: 209 mg/dL — ABNORMAL HIGH (ref 70–99)
Glucose-Capillary: 189 mg/dL — ABNORMAL HIGH (ref 70–99)

## 2019-09-19 MED ORDER — PREDNISONE 20 MG PO TABS: ORAL_TABLET | ORAL | 0 refills | Status: AC

## 2019-09-19 NOTE — Consult Note (Signed)
   [facility] CM Inpatient Consult   09/19/2019  Bradley [name] Sr. [birth year] [id number]    Patient checked forpotential [place] [facility] services neededunder his Medicare/ [employer] ACO planwith 21% risk score for unplanned readmission and hospitalizations.    Review of patient's medical recordand MD brief narrative dated 09/18/19 revealas: 78 y.o.malewithhistory of atrial fibrillation status post AV nodal ablation and pacemaker placement, diastolic CHF, COPD, CAD and history of intracranial hemorrhage not on anticoagulation for A. Fib.    presented to the ER because of increasing shortness of breath, increasing wheezing and nonproductive cough with chest pain on coughing. Treated with IV steroids and neb tx, O2- Baseline at 2-3LNC. COVID is neg. (Acute respiratory failure with hypoxia secondary to COPD exacerbation with pneumonia, Community-acquired pneumonia, DM type 2 with hyperglycemia)  Primary Care Provider isDr. Kathlene November with [facility], listed to provide transition of care follow-up.  Attempts to call patient in the room but unsuccessful. Spoke to patient's RN for assistance to connect with patient and was told that patient just left the room and has no discharge needs identified. Patient transitionedto home prior to speaking with him. Chart review shows that patient lives at home with spouse.   Plan:Will follow patient with EMMIPneumoniacalls post discharge.   Please place a [facility] Care Management consult as appropriate and for questions, please call:   Edwena Felty A. Rolene Andrades, BSN, RN-BC [facility] Liaison Cell: [phone]

## 2019-09-19 NOTE — Discharge Summary (Signed)
Physician Discharge Summary  Bradley [name] Sr. LKG:[id number] DOB: 04-11-1941 DOA: 09/13/2019  PCP: [name], MD  Admit date: 09/13/2019 Discharge date: 09/19/2019  Admitted From: Home Disposition:  Home  Recommendations for Outpatient Follow-up:  1. Follow up with PCP in 1-2 weeks 2. Please obtain BMP/CBC in one week 3. Please follow up on the following pending results:  Home Health: None  Equipment/Devices: None   Discharge Condition: Stable CODE STATUS: Full Diet recommendation: Cardiac, low-sodium  Brief/Interim Summary: Patient admitted on 12/3 for acute respiratory failure secondary to COPD with acute exacerbation and community-acquired pneumonia.  Patient was started on Rocephin and azithromycin and is continued on those antibiotics x5 days.  He was also started on Solu-Medrol, which he is continued.  Patient's respiratory status has gradually improved and the patient has weaned back to home oxygen levels.  Patient will be discharged home on prolonged prednisone taper and he will follow-up with his primary care physician next week.  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[employer]   COPD with acute exacerbation (HCC)   CAP (community acquired pneumonia)   Acute respiratory failure with hypoxia [facility])    Discharge Instructions  Discharge Instructions    Call MD for:  difficulty breathing, headache or visual disturbances   Complete by: As directed    Call MD for:  hives   Complete by: As directed    Call MD for:  persistant nausea and vomiting   Complete by: As directed    Call MD for:  redness, tenderness, or signs of infection (pain, swelling, redness, odor or  green/yellow discharge around incision site)   Complete by: As directed    Call MD for:  severe uncontrolled pain   Complete by: As directed    Call MD for:  temperature >100.4   Complete by: As directed    Diet - low sodium heart healthy   Complete by: As directed    Increase activity slowly   Complete by: As directed      Allergies as of 09/19/2019      Reactions   Hydrocodone Other (See Comments)   "given to him in the hospital; went thru withdrawals once home; dr said not to take it again" (09/27/2012)   Tramadol Other (See Comments)   "given to him in the hospital; went thru withdrawals once home; dr said not to take it again" (09/27/2012)   Tadalafil Other (See Comments)   headache, backache   Avelox [moxifloxacin Hydrochloride] Itching, Other (See Comments)   Headache      Medication List    TAKE these medications   acetaminophen 500 MG tablet Commonly known as: TYLENOL Take 500 mg by mouth every 6 (six) hours as needed for headache.   amLODipine 10 MG tablet Commonly known as: NORVASC Take 1 tablet (10 mg total) by mouth daily.   aspirin 81 MG tablet Take 81 mg by mouth every morning.   atorvastatin 20 MG tablet Commonly known as: LIPITOR Take 1 tablet (20 mg total) by mouth at bedtime.   budesonide 0.5 MG/2ML nebulizer solution Commonly known as: PULMICORT Take 2 mLs (0.5 mg total) by nebulization 2 (two) times daily for 30 days.   calcium-vitamin D  500-200 MG-UNIT tablet Commonly known as: OSCAL WITH D Take 1 tablet by mouth every morning.   carvedilol 25 MG tablet Commonly known as: COREG Take 1 tablet (25 mg total) by mouth 2 (two) times daily with a meal.   furosemide 40 MG tablet Commonly known as: LASIX Take 1 tablet (40 mg total) by mouth 2 (two) times daily.   ipratropium-albuterol 0.5-2.5 (3) MG/3ML Soln Commonly known as: DUONEB Take 3 mLs by nebulization 2 (two) times daily as needed.   losartan 100 MG tablet Commonly known as:  COZAAR Take 0.5 tablets (50 mg total) by mouth at bedtime.   multivitamins ther. w/minerals Tabs tablet Take 1 tablet by mouth daily.   nitroGLYCERIN 0.4 MG SL tablet Commonly known as: NITROSTAT Place 1 tablet (0.4 mg total) under the tongue every 5 (five) minutes x 3 doses as needed for chest pain.   onetouch ultrasoft lancets Check blood sugars no more than twice daily   pantoprazole 40 MG tablet Commonly known as: PROTONIX Take 1 tablet (40 mg total) by mouth every morning.   predniSONE 20 MG tablet Commonly known as: Deltasone Take 2 tablets (40 mg total) by mouth daily for 4 days, THEN 1 tablet (20 mg total) daily for 4 days, THEN 0.5 tablets (10 mg total) daily for 4 days. Start taking on: September 19, 2019   sitaGLIPtin 100 MG tablet Commonly known as: Januvia Take 1 tablet (100 mg total) by mouth daily.   tamsulosin 0.4 MG Caps capsule Commonly known as: FLOMAX Take 1 capsule (0.4 mg total) by mouth daily after supper.   Tylenol PM Extra Strength 25-500 MG Tabs tablet Generic drug: diphenhydramine-acetaminophen Take 1 tablet by mouth at bedtime.      Follow-up Information    Colon Branch, MD. Go to.   Specialty: Internal Medicine Contact information: [address] [place] [address] [phone]        Skeet Latch, MD .   Specialty: Cardiology Contact information: [address] [place] [address] [phone]          Allergies  Allergen Reactions  . Hydrocodone Other (See Comments)    "given to him in the hospital; went thru withdrawals once home; dr said not to take it again" (09/27/2012)  . Tramadol Other (See Comments)    "given to him in the hospital; went thru withdrawals once home; dr said not to take it again" (09/27/2012)  . Tadalafil Other (See Comments)    headache, backache  . Avelox [Moxifloxacin Hydrochloride] Itching and Other (See Comments)    Headache     Consultations:  None   Procedures/Studies: Ct Angio Chest Pe W And/or Wo Contrast  Result Date: 09/13/2019 CLINICAL DATA:  Shortness of breath, fever, cough, hypoxia. EXAM: CT ANGIOGRAPHY CHEST WITH CONTRAST TECHNIQUE: Multidetector CT imaging of the chest was performed using the standard protocol during bolus administration of intravenous contrast. Multiplanar CT image reconstructions and MIPs were obtained to evaluate the vascular anatomy. CONTRAST:  74mL OMNIPAQUE IOHEXOL 350 MG/ML SOLN COMPARISON:  Radiograph earlier this day. Chest CTA 05/09/2019 FINDINGS: Cardiovascular: There are no filling defects within the pulmonary arteries to suggest pulmonary embolus. Atherosclerosis of the thoracic aorta. Examination not tailored for aortic evaluation, allowing for this, no periaortic stranding. Multi chamber cardiomegaly. Contrast refluxes into the hepatic veins and IVC. Pacemaker in place. No pericardial effusion. Mediastinum/Nodes: Prominent and mildly enlarged lymph nodes, similar to prior exam. Right upper paratracheal node measures 15 mm  short axis, slightly decreased. Right subcarinal node measures 2.2 cm short axis, mildly increased. No esophageal wall thickening. No suspicious thyroid nodule. AP window node measures 15 mm, unchanged. Lungs/Pleura: Heterogeneous pulmonary parenchyma, similar to prior exam, possible small airways disease. Small focal consolidation in the lingula with air bronchograms. Scattered linear opacities in both lobes, improved from prior exam. Minimal right pleural thickening with resolution of previous pleural effusion. Scattered areas of diaphragmatic calcification at the right lung base, unchanged. Upper Abdomen: Contrast refluxes into the hepatic veins and IVC. Atherosclerosis of upper abdominal vasculature. Musculoskeletal: There are no acute or suspicious osseous abnormalities. Degenerative change of both shoulders and throughout the spine. Review of the MIP images  confirms the above findings. IMPRESSION: 1. No pulmonary embolus. 2. Multi chamber cardiomegaly with reflux of contrast into the hepatic veins and IVC, consistent with elevated right heart pressures. 3. Small focal consolidation in the lingula with air bronchograms, suspicious for bronchopneumonia. 4. Chronic findings include heterogeneous pulmonary attenuation suggesting underlying small airways disease. Mild mediastinal adenopathy, similar to prior exam, likely reactive. Aortic Atherosclerosis (ICD10-I70.0). Electronically Signed   By: Narda Rutherford M.D.   On: 09/13/2019 20:06   Dg Chest Portable 1 View  Result Date: 09/13/2019 CLINICAL DATA:  Cough.  Shortness of breath. EXAM: PORTABLE CHEST 1 VIEW COMPARISON:  06/13/2019.  05/07/2019.  12/21/2018.  CT 05/09/2019. FINDINGS: Cardiac pacer noted with lead tips over the right ventricle. Stable cardiomegaly and pulmonary venous congestion. Chronic interstitial changes again noted. Superimposed acute interstitial process cannot be excluded. Small bilateral pleural effusions versus scarring. No pneumothorax. IMPRESSION: 1. Cardiac pacer noted with lead tips in the right ventricle. Stable cardiomegaly and pulmonary venous congestion. 2. Chronic pulmonary interstitial changes again noted. A superimposed active interstitial process such as interstitial edema and/or pneumonitis cannot be excluded. Small bilateral pleural effusions versus pleural scarring. Electronically Signed   By: Maisie Fus  Register   On: 09/13/2019 16:18       Subjective:   Discharge Exam: Vitals:   09/19/19 0733 09/19/19 0740  BP: 129/73   Pulse: 71   Resp: 18   Temp:    SpO2: 91% 94%   Vitals:   09/18/19 2039 09/19/19 0645 09/19/19 0733 09/19/19 0740  BP: 132/79 136/85 129/73   Pulse: 70 86 71   Resp: 20 18 18    Temp: (!) 97.4 F (36.3 C) 97.6 F (36.4 C)    TempSrc: Oral Oral    SpO2: 100% 97% 91% 94%  Weight:  94.4 kg    Height:         General: Pt is alert,  awake, not in acute distress Cardiovascular: RRR, S1/S2 +, no rubs, no gallops Respiratory: CTA bilaterally, no wheezing, no rhonchi Abdominal: Soft, NT, ND, bowel sounds + Extremities: no edema, no cyanosis    The results of significant diagnostics from this hospitalization (including imaging, microbiology, ancillary and laboratory) are listed below for reference.     Microbiology: Recent Results (from the past 240 hour(s))  SARS CORONAVIRUS 2 (TAT 6-24 HRS) Nasopharyngeal Nasopharyngeal Swab     Status: None   Collection Time: 09/13/19  7:04 PM   Specimen: Nasopharyngeal Swab  Result Value Ref Range Status   SARS Coronavirus 2 NEGATIVE NEGATIVE Final    Comment: (NOTE) SARS-CoV-2 target nucleic acids are NOT DETECTED. The SARS-CoV-2 RNA is generally detectable in upper and lower respiratory specimens during the acute phase of infection. Negative results do not preclude SARS-CoV-2 infection, do not rule out co-infections with other pathogens,  and should not be used as the sole basis for treatment or other patient management decisions. Negative results must be combined with clinical observations, patient history, and epidemiological information. The expected result is Negative. Fact Sheet for Patients: HairSlick.nohttps://www.fda.gov/media/138098/download Fact Sheet for Healthcare Providers: quierodirigir.comhttps://www.fda.gov/media/138095/download This test is not yet approved or cleared by the [place] FDA and  has been authorized for detection and/or diagnosis of SARS-CoV-2 by FDA under an Emergency Use Authorization (EUA). This EUA will remain  in effect (meaning this test can be used) for the duration of the COVID-19 declaration under Section 56 4(b)(1) of the Act, 21 U.S.C. section 360bbb-3(b)(1), unless the authorization is terminated or revoked sooner. Performed at [facility], [address] [place], [place] [address]      Labs: BNP (last 3 results) Recent Labs     09/19/18 1219 12/21/18 2030 09/13/19 1603  BNP 324.5* 237.9* 165.6*   Basic Metabolic Panel: Recent Labs  Lab 09/13/19 1603 09/13/19 2334 09/14/19 0359 09/18/19 0353  NA 138  --  137 138  K 4.4  --  4.2 4.0  CL 99  --  99 95*  CO2 30  --  27 33*  GLUCOSE 128*  --  243* 279*  BUN 12  --  10 23  CREATININE 0.85 0.84 0.80 0.81  CALCIUM 8.3*  --  8.9 8.4*   Liver Function Tests: Recent Labs  Lab 09/13/19 1603 09/18/19 0353  AST 27 19  ALT 16 25  ALKPHOS 91 59  BILITOT 0.8 0.6  PROT 6.7 5.6*  ALBUMIN 3.5 2.8*   No results for input(s): LIPASE, AMYLASE in the last 168 hours. No results for input(s): AMMONIA in the last 168 hours. CBC: Recent Labs  Lab 09/13/19 1603 09/13/19 2334 09/14/19 0359  WBC 8.2 10.1 6.8  NEUTROABS 6.3  --   --   HGB 13.7 14.2 14.3  HCT 41.4 41.9 42.3  MCV 93.2 90.3 91.4  PLT 190 182 187   Cardiac Enzymes: No results for input(s): CKTOTAL, CKMB, CKMBINDEX, TROPONINI in the last 168 hours. BNP: Invalid input(s): POCBNP CBG: Recent Labs  Lab 09/17/19 2143 09/18/19 0756 09/18/19 1109 09/18/19 1636 09/19/19 0731  GLUCAP 250* 336* 244* 209* 189*   D-Dimer No results for input(s): DDIMER in the last 72 hours. Hgb A1c No results for input(s): HGBA1C in the last 72 hours. Lipid Profile No results for input(s): CHOL, HDL, LDLCALC, TRIG, CHOLHDL, LDLDIRECT in the last 72 hours. Thyroid function studies No results for input(s): TSH, T4TOTAL, T3FREE, THYROIDAB in the last 72 hours.  Invalid input(s): FREET3 Anemia work up No results for input(s): VITAMINB12, FOLATE, FERRITIN, TIBC, IRON, RETICCTPCT in the last 72 hours. Urinalysis    Component Value Date/Time   COLORURINE YELLOW 07/15/2015 1440   APPEARANCEUR CLEAR 07/15/2015 1440   LABSPEC 1.015 07/15/2015 1440   PHURINE 6.0 07/15/2015 1440   GLUCOSEU NEGATIVE 07/15/2015 1440   HGBUR TRACE-LYSED (A) 07/15/2015 1440   BILIRUBINUR NEGATIVE 07/15/2015 1440   KETONESUR TRACE (A)  07/15/2015 1440   PROTEINUR NEGATIVE 07/06/2012 0341   UROBILINOGEN 1.0 07/15/2015 1440   NITRITE NEGATIVE 07/15/2015 1440   LEUKOCYTESUR NEGATIVE 07/15/2015 1440   Sepsis Labs Invalid input(s): PROCALCITONIN,  WBC,  LACTICIDVEN Microbiology Recent Results (from the past 240 hour(s))  SARS CORONAVIRUS 2 (TAT 6-24 HRS) Nasopharyngeal Nasopharyngeal Swab     Status: None   Collection Time: 09/13/19  7:04 PM   Specimen: Nasopharyngeal Swab  Result Value Ref Range Status  SARS Coronavirus 2 NEGATIVE NEGATIVE Final    Comment: (NOTE) SARS-CoV-2 target nucleic acids are NOT DETECTED. The SARS-CoV-2 RNA is generally detectable in upper and lower respiratory specimens during the acute phase of infection. Negative results do not preclude SARS-CoV-2 infection, do not rule out co-infections with other pathogens, and should not be used as the sole basis for treatment or other patient management decisions. Negative results must be combined with clinical observations, patient history, and epidemiological information. The expected result is Negative. Fact Sheet for Patients: HairSlick.nohttps://www.fda.gov/media/138098/download Fact Sheet for Healthcare Providers: quierodirigir.comhttps://www.fda.gov/media/138095/download This test is not yet approved or cleared by the [place] FDA and  has been authorized for detection and/or diagnosis of SARS-CoV-2 by FDA under an Emergency Use Authorization (EUA). This EUA will remain  in effect (meaning this test can be used) for the duration of the COVID-19 declaration under Section 56 4(b)(1) of the Act, 21 U.S.C. section 360bbb-3(b)(1), unless the authorization is terminated or revoked sooner. Performed at [facility], [address] [place], [place] [address]      Time coordinating discharge: Over 30 minutes  SIGNED:   Levie HeritageJacob J Champayne Kocian, DO [facility] 09/19/2019, 8:29 AM Pager

## 2019-09-19 NOTE — Discharge Instructions (Signed)
COPD and Physical Activity °Chronic obstructive pulmonary disease (COPD) is a long-term (chronic) condition that affects the lungs. COPD is a general term that can be used to describe many different lung problems that cause lung swelling (inflammation) and limit airflow, including chronic bronchitis and emphysema. °The main symptom of COPD is shortness of breath, which makes it harder to do even simple tasks. This can also make it harder to exercise and be active. Talk with your health care provider about treatments to help you breathe better and actions you can take to prevent breathing problems during physical activity. °What are the benefits of exercising with COPD? °Exercising regularly is an important part of a healthy lifestyle. You can still exercise and do physical activities even though you have COPD. Exercise and physical activity improve your shortness of breath by increasing blood flow (circulation). This causes your heart to pump more oxygen through your body. Moderate exercise can improve your: °· Oxygen use. °· Energy level. °· Shortness of breath. °· Strength in your breathing muscles. °· Heart health. °· Sleep. °· Self-esteem and feelings of self-worth. °· Depression, stress, and anxiety levels. °Exercise can benefit everyone with COPD. The severity of your disease may affect how hard you can exercise, especially at first, but everyone can benefit. Talk with your health care provider about how much exercise is safe for you, and which activities and exercises are safe for you. °What actions can I take to prevent breathing problems during physical activity? °· Sign up for a pulmonary rehabilitation program. This type of program may include: °? Education about lung diseases. °? Exercise classes that teach you how to exercise and be more active while improving your breathing. This usually involves: °§ Exercise using your lower extremities, such as a stationary bicycle. °§ About 30 minutes of exercise, 2  to 5 times per week, for 6 to 12 weeks °§ Strength training, such as push ups or leg lifts. °? Nutrition education. °? Group classes in which you can talk with others who also have COPD and learn ways to manage stress. °· If you use an oxygen tank, you should use it while you exercise. Work with your health care provider to adjust your oxygen for your physical activity. Your resting flow rate is different from your flow rate during physical activity. °· While you are exercising: °? Take slow breaths. °? Pace yourself and do not try to go too fast. °? Purse your lips while breathing out. Pursing your lips is similar to a kissing or whistling position. °? If doing exercise that uses a quick burst of effort, such as weight lifting: °§ Breathe in before starting the exercise. °§ Breathe out during the hardest part of the exercise (such as raising the weights). °Where to find support °You can find support for exercising with COPD from: °· Your health care provider. °· A pulmonary rehabilitation program. °· Your local health department or community health programs. °· Support groups, online or in-person. Your health care provider may be able to recommend support groups. °Where to find more information °You can find more information about exercising with COPD from: °· American Lung Association: lung.org. °· COPD Foundation: copdfoundation.org. °Contact a health care provider if: °· Your symptoms get worse. °· You have chest pain. °· You have nausea. °· You have a fever. °· You have trouble talking or catching your breath. °· You want to start a new exercise program or a new activity. °Summary °· COPD is a general term that can   be used to describe many different lung problems that cause lung swelling (inflammation) and limit airflow. This includes chronic bronchitis and emphysema. °· Exercise and physical activity improve your shortness of breath by increasing blood flow (circulation). This causes your heart to provide more  oxygen to your body. °· Contact your health care provider before starting any exercise program or new activity. Ask your health care provider what exercises and activities are safe for you. °This information is not intended to replace advice given to you by your health care provider. Make sure you discuss any questions you have with your health care provider. °Document Released: 10/20/2017 Document Revised: 01/17/2019 Document Reviewed: 10/20/2017 °Elsevier Patient Education © 2020 Elsevier Inc. ° °

## 2019-09-19 NOTE — Plan of Care (Signed)
  Problem: Education: Goal: Knowledge of General Education information will improve Description: Including pain rating scale, medication(s)/side effects and non-pharmacologic comfort measures Outcome: Progressing   Problem: Education: Goal: Knowledge of General Education information will improve Description: Including pain rating scale, medication(s)/side effects and non-pharmacologic comfort measures Outcome: Progressing   Problem: Health Behavior/Discharge Planning: Goal: Ability to manage health-related needs will improve Outcome: Progressing   Problem: Clinical Measurements: Goal: Ability to maintain clinical measurements within normal limits will improve Outcome: Progressing Goal: Will remain free from infection Outcome: Progressing Goal: Diagnostic test results will improve Outcome: Progressing Goal: Respiratory complications will improve Outcome: Progressing Goal: Cardiovascular complication will be avoided Outcome: Progressing   Problem: Activity: Goal: Risk for activity intolerance will decrease Outcome: Progressing   Problem: Nutrition: Goal: Adequate nutrition will be maintained Outcome: Progressing   Problem: Coping: Goal: Level of anxiety will decrease Outcome: Progressing   Problem: Pain Managment: Goal: General experience of comfort will improve Outcome: Progressing   Problem: Safety: Goal: Ability to remain free from injury will improve Outcome: Progressing   Problem: Skin Integrity: Goal: Risk for impaired skin integrity will decrease Outcome: Progressing   Elesa Hacker, RN

## 2019-09-20 ENCOUNTER — Telehealth: Payer: Self-pay | Admitting: *Deleted

## 2019-09-20 NOTE — Telephone Encounter (Signed)
Transition Care Management Follow-up Telephone Call   Date discharged?09/19/19   How have you been since you were released from the hospital? "much better"   Do you understand why you were in the hospital? yes   Do you understand the discharge instructions? yes   Where were you discharged to? Home w/ wife   Items Reviewed:  Medications reviewed: yes  Allergies reviewed: yes  Dietary changes reviewed: yes  Referrals reviewed: yes   Functional Questionnaire:   Activities of Daily Living (ADLs):   He states they are independent in the following: ambulation, bathing and hygiene, feeding, continence, grooming, toileting and dressing States they require assistance with the following: na   Any transportation issues/concerns?: no   Any patient concerns? no   Confirmed importance and date/time of follow-up visits scheduled yes  Provider Appointment booked with PCP 09/25/19  Confirmed with patient if condition begins to worsen call PCP or go to the ER.  Patient was given the office number and encouraged to call back with question or concerns.  : yes

## 2019-09-24 ENCOUNTER — Other Ambulatory Visit: Payer: Self-pay | Admitting: *Deleted

## 2019-09-24 NOTE — Patient Outreach (Signed)
Red EMMI general discharge received for 09/21/19- Other questions, problems- yes.  Outreach call to pt to address, spoke with pt, HIPAA verified, pt reports "that's a mistake, not correct"  No concerns voiced.  Jacqlyn Larsen [facility], [place] Coordinator [phone]

## 2019-09-24 NOTE — Progress Notes (Signed)
Remote pacemaker transmission.   

## 2019-09-25 ENCOUNTER — Other Ambulatory Visit: Payer: Self-pay

## 2019-09-25 ENCOUNTER — Encounter: Payer: Self-pay | Admitting: Internal Medicine

## 2019-09-25 ENCOUNTER — Ambulatory Visit (INDEPENDENT_AMBULATORY_CARE_PROVIDER_SITE_OTHER): Payer: Medicare Other | Admitting: Internal Medicine

## 2019-09-25 VITALS — BP 124/76 | HR 93 | Temp 96.5°F | Resp 16 | Ht 69.0 in | Wt 207.0 lb

## 2019-09-25 DIAGNOSIS — I10 Essential (primary) hypertension: Secondary | ICD-10-CM

## 2019-09-25 DIAGNOSIS — J441 Chronic obstructive pulmonary disease with (acute) exacerbation: Secondary | ICD-10-CM | POA: Diagnosis not present

## 2019-09-25 DIAGNOSIS — J189 Pneumonia, unspecified organism: Secondary | ICD-10-CM

## 2019-09-25 LAB — COMPREHENSIVE METABOLIC PANEL
ALT: 24 U/L (ref 0–53)
AST: 13 U/L (ref 0–37)
Albumin: 3.7 g/dL (ref 3.5–5.2)
Alkaline Phosphatase: 64 U/L (ref 39–117)
BUN: 15 mg/dL (ref 6–23)
CO2: 38 mEq/L — ABNORMAL HIGH (ref 19–32)
Calcium: 8.8 mg/dL (ref 8.4–10.5)
Chloride: 95 mEq/L — ABNORMAL LOW (ref 96–112)
Creatinine, Ser: 0.8 mg/dL (ref 0.40–1.50)
GFR: 93.34 mL/min (ref >60.00)
Glucose, Bld: 201 mg/dL — ABNORMAL HIGH (ref 70–99)
Potassium: 4.8 mEq/L (ref 3.5–5.1)
Sodium: 137 mEq/L (ref 135–145)
Total Bilirubin: 0.9 mg/dL (ref 0.2–1.2)
Total Protein: 6.2 g/dL (ref 6.0–8.3)

## 2019-09-25 LAB — CBC WITH DIFFERENTIAL/PLATELET
Basophils Absolute: 0 10*3/uL (ref 0.0–0.1)
Basophils Relative: 0.2 % (ref 0.0–3.0)
Eosinophils Absolute: 0.1 10*3/uL (ref 0.0–0.7)
Eosinophils Relative: 0.4 % (ref 0.0–5.0)
HCT: 42.2 % (ref 39.0–52.0)
Hemoglobin: 14.1 g/dL (ref 13.0–17.0)
Lymphocytes Relative: 21.4 % (ref 12.0–46.0)
Lymphs Abs: 3.2 10*3/uL (ref 0.7–4.0)
MCHC: 33.3 g/dL (ref 30.0–36.0)
MCV: 92.9 fl (ref 78.0–100.0)
Monocytes Absolute: 1.2 10*3/uL — ABNORMAL HIGH (ref 0.1–1.0)
Monocytes Relative: 8.2 % (ref 3.0–12.0)
Neutro Abs: 10.6 10*3/uL — ABNORMAL HIGH (ref 1.4–7.7)
Neutrophils Relative %: 69.8 % (ref 43.0–77.0)
Platelets: 189 10*3/uL (ref 150.0–400.0)
RBC: 4.55 Mil/uL (ref 4.22–5.81)
RDW: 14.1 % (ref 11.5–15.5)
WBC: 15.1 10*3/uL — ABNORMAL HIGH (ref 4.0–10.5)

## 2019-09-25 NOTE — Assessment & Plan Note (Signed)
Acute respiratory failure due to COPD exacerbation and community-acquired pneumonia: Making good progress, he essentially feels back to normal, O2 sats at home is very good. Recommend to finish prednisone. We will check a CMP and CBC. No follow-up chest x-ray unless symptoms continue.  (Infiltrate was found on CT) DM: Anticipate his CBGs will be temporarily high due to prednisone.  Last A1c was excellent, continue Januvia. HTN: Seems very good, continue amlodipine, losartan. RTC 2 months

## 2019-09-25 NOTE — Progress Notes (Signed)
Subjective:    Patient ID: Bradley [name]., male    DOB: 11-02-1940, 78 y.o.   MRN: [id number]  DOS:  09/25/2019 Type of visit - description: TCM Admitted to the hospital and discharged 09/19/2019. He was admitted due to respiratory symptoms. Diagnosed with COPD with acute exacerbation &  community-acquired pneumonia causing acute respiratory failure. Treated and finished antibiotics.  Got Solu-Medrol Gradual improvement, wean and off to previous levels of oxygen supplements. Discharge on a prolonged prednisone taper.  Recent labs: 09/18/2019: Creatinine normal, blood sugar 279, potassium 4.0 09/14/2019: Hemoglobin normal  09/13/2019: -Chest x-ray show cardiomegaly and some pulmonary venous congestion, pneumonitis could not be ruled out.  Small bilateral pleural effusions -CT chest: No PE, small focal consolidation in the lingula   Review of Systems Since he left the hospital, he is feeling almost back to baseline. O2 sat ranged from 95% to 98%, decreased with exertion as usual. No recent ambulatory CBGs. No cough or sputum production No fever chills No chest pain or lower extremity edema Breathing again is essentially back to normal. No nausea, vomiting, diarrhea  Past Medical History:  Diagnosis Date  . Abnormal CT scan, chest 2012   CT chest, several lymphadenopathies. Sees pulmonary  . Anemia    intermittent  . Arthritis    "?back" (09/19/2018)  . Atrial fibrillation (HCC)    s/p AV node ablation & BiV PPM implantation 09/08/11 (op dictation pending)  . BPH (benign prostatic hyperplasia)    Saw Dr [name] 2004, normal renal u/s  . Bronchitis 08/26/2017  . CAD (coronary artery disease)   . CHF (congestive heart failure) (HCC)    Thought primarily to be non-systolic although EF down (EF 82-42% 12/2010, down to 35-40% 09/05/11), cath 2008 with no CAD, nuclear study 07/2011 showing Small area of reversibility in the distal ant/lat wall the left ventricle suspicious for  ischemia/septal wall HK but felt to be low risk  (per D/C Summary 07/2011)  . Chronic bronchitis (HCC)    "get it ~ q yr"  . Heart murmur   . HLD (hyperlipidemia)   . HTN (hypertension)   . 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 Insomnia   . Migraines    "very very rare"  . On home oxygen therapy    "2L; 24/7" (09/19/2018)  . Pacemaker   . Peripheral vascular disease (HCC)    ??  . Pleural effusion 2008   S/p decortication  . Pulmonary HTN (HCC)    per cath 2008  . Type II diabetes mellitus (HCC) 1999    Past Surgical History:  Procedure Laterality Date  . BI-VENTRICULAR PACEMAKER INSERTION Left 09/08/2011   Procedure: BI-VENTRICULAR PACEMAKER INSERTION (CRT-P);  Surgeon: Marinus Maw, MD;  Location: [facility] CATH LAB;  Service: Cardiovascular;  Laterality: Left;  . CARDIAC CATHETERIZATION     "couple times; never had balloon or stent" (09/19/2018)  . CATARACT EXTRACTION W/ INTRAOCULAR LENS  IMPLANT, BILATERAL Bilateral 08/2018  . COLONOSCOPY  03/10/11   normal  . INSERT / REPLACE / REMOVE PACEMAKER  09/08/11   pacemaker placement  . LUNG DECORTICATION    . PLEURAL SCARIFICATION    . pneumothorax with fibrothorax  ~ 2010  . TONSILLECTOMY     "as a kid"     Social History   Socioeconomic History  . Marital status: Married    Spouse name: [name]  . Number of children: 1  . Years of education: Not on  file  . Highest education level: Not on file  Occupational History  . Occupation: retired Magazine features editor: RETIRED  Tobacco Use  . Smoking status: Former Smoker    Packs/day: 2.50    Years: 25.00    Pack years: 62.50    Types: Cigarettes, Cigars    Quit date: 10/11/1980    Years since quitting: 38.9  . Smokeless tobacco: Never Used  Substance and Sexual Activity  . Alcohol use: Not Currently    Comment: 1 can of beer ever 4-5 months  . Drug use: No  . Sexual activity: Not Currently  Other Topics Concern  . Not on  file  Social History Narrative   Moved from [place]   He lives w/ his wife, [place]   1 son, substance abuse, passed away 10/05/2016         Social Determinants of Health   Financial Resource Strain:   . Difficulty of Paying Living Expenses: Not on file  Food Insecurity:   . Worried About Charity fundraiser in the Last Year: Not on file  . Ran Out of Food in the Last Year: Not on file  Transportation Needs:   . Lack of Transportation (Medical): Not on file  . Lack of Transportation (Non-Medical): Not on file  Physical Activity:   . Days of Exercise per Week: Not on file  . Minutes of Exercise per Session: Not on file  Stress:   . Feeling of Stress : Not on file  Social Connections:   . Frequency of Communication with Friends and Family: Not on file  . Frequency of Social Gatherings with Friends and Family: Not on file  . Attends Religious Services: Not on file  . Active Member of Clubs or Organizations: Not on file  . Attends Archivist Meetings: Not on file  . Marital Status: Not on file  Intimate Partner Violence:   . Fear of Current or Ex-Partner: Not on file  . Emotionally Abused: Not on file  . Physically Abused: Not on file  . Sexually Abused: Not on file      Allergies as of 09/25/2019      Reactions   Hydrocodone Other (See Comments)   "given to him in the hospital; went thru withdrawals once home; dr said not to take it again" (09/27/2012)   Tramadol Other (See Comments)   "given to him in the hospital; went thru withdrawals once home; dr said not to take it again" (09/27/2012)   Tadalafil Other (See Comments)   headache, backache   Avelox [moxifloxacin Hydrochloride] Itching, Other (See Comments)   Headache      Medication List       Accurate as of September 25, 2019  5:27 PM. If you have any questions, ask your nurse or doctor.        acetaminophen 500 MG tablet Commonly known as: TYLENOL Take 500 mg by mouth every 6 (six) hours as  needed for headache.   amLODipine 10 MG tablet Commonly known as: NORVASC Take 1 tablet (10 mg total) by mouth daily.   aspirin 81 MG tablet Take 81 mg by mouth every morning.   atorvastatin 20 MG tablet Commonly known as: LIPITOR Take 1 tablet (20 mg total) by mouth at bedtime.   budesonide 0.5 MG/2ML nebulizer solution Commonly known as: PULMICORT Take 2 mLs (0.5 mg total) by nebulization 2 (two) times daily for 30 days.   calcium-vitamin D 500-200 MG-UNIT tablet Commonly  known as: OSCAL WITH D Take 1 tablet by mouth every morning.   carvedilol 25 MG tablet Commonly known as: COREG Take 1 tablet (25 mg total) by mouth 2 (two) times daily with a meal.   furosemide 40 MG tablet Commonly known as: LASIX Take 1 tablet (40 mg total) by mouth 2 (two) times daily.   ipratropium-albuterol 0.5-2.5 (3) MG/3ML Soln Commonly known as: DUONEB Take 3 mLs by nebulization 2 (two) times daily as needed.   losartan 100 MG tablet Commonly known as: COZAAR Take 0.5 tablets (50 mg total) by mouth at bedtime.   multivitamins ther. w/minerals Tabs tablet Take 1 tablet by mouth daily.   nitroGLYCERIN 0.4 MG SL tablet Commonly known as: NITROSTAT Place 1 tablet (0.4 mg total) under the tongue every 5 (five) minutes x 3 doses as needed for chest pain.   onetouch ultrasoft lancets Check blood sugars no more than twice daily   pantoprazole 40 MG tablet Commonly known as: PROTONIX Take 1 tablet (40 mg total) by mouth every morning.   predniSONE 20 MG tablet Commonly known as: Deltasone Take 2 tablets (40 mg total) by mouth daily for 4 days, THEN 1 tablet (20 mg total) daily for 4 days, THEN 0.5 tablets (10 mg total) daily for 4 days. Start taking on: September 19, 2019   sitaGLIPtin 100 MG tablet Commonly known as: Januvia Take 1 tablet (100 mg total) by mouth daily.   tamsulosin 0.4 MG Caps capsule Commonly known as: FLOMAX Take 1 capsule (0.4 mg total) by mouth daily after  supper.   Tylenol PM Extra Strength 25-500 MG Tabs tablet Generic drug: diphenhydramine-acetaminophen Take 1 tablet by mouth at bedtime.           Objective:   Physical Exam BP 124/76 (BP Location: Right Arm, Patient Position: Sitting, Cuff Size: Normal)   Pulse 93   Temp (!) 96.5 F (35.8 C) (Temporal)   Resp 16   Ht 5\' 9"  (1.753 m)   Wt 207 lb (93.9 kg)   SpO2 93%   BMI 30.57 kg/m  General:   Well developed, NAD, BMI noted.  Wearing oxygen HEENT:  Normocephalic . Face symmetric, atraumatic Lungs:  decreased breath sounds, no crackles, no wheezing. Normal respiratory effort, no intercostal retractions, no accessory muscle use. Heart: Seems regular, no murmur.  no pretibial edema bilaterally  Abdomen:  Not distended, soft, non-tender.  Skin: Not pale. Not jaundice Neurologic:  alert & oriented X3.  Speech normal, gait appropriate for age and unassisted Psych--  Cognition and judgment appear intact.  Cooperative with normal attention span and concentration.  Behavior appropriate. No anxious or depressed appearing.     Assessment    Assessment  DM HTN Hyperlipidemia GERD Anxiety- insomnia  BPH Cardiovascular:Dr [name]  --  Cath x 2 [id number]: no significant CAD -- CHF, non-ischemic  --Atrial fibrillation; h/o AV node ablation, has a Pacemaker --h/o IC bleeding: Not anticoagulated --Peripheral vascular disease? Pulmonary: Dr Sherene SiresWert, last OV 10-18-2014, f/u prn --COPD, severe, chronic resp failure, night O2 prn, has portable O2  --Pleural effusion s/p decortication --Pulmonary hypertension  Stasis dermatitis   PLAN: Acute respiratory failure due to COPD exacerbation and community-acquired pneumonia: Making good progress, he essentially feels back to normal, O2 sats at home is very good. Recommend to finish prednisone. We will check a CMP and CBC. No follow-up chest x-ray unless symptoms continue.  (Infiltrate was found on CT) DM: Anticipate his CBGs  will be temporarily high due to prednisone.  Last A1c was excellent, continue Januvia. HTN: Seems very good, continue amlodipine, losartan. RTC 2 months   This visit occurred during the SARS-CoV-2 public health emergency.  Safety protocols were in place, including screening questions prior to the visit, additional usage of staff PPE, and extensive cleaning of exam room while observing appropriate contact time as indicated for disinfecting solutions.

## 2019-09-25 NOTE — Patient Instructions (Addendum)
GO TO THE LAB : Get the blood work     GO TO THE FRONT DESK Schedule your next appointment   For a check up in 2 months    Diabetes: Check your blood sugar  once a day      at different times of the day  GOALS: Fasting before a meal 70- 130 2 hours after a meal less than 180   For the next few days your sugar can be around 200 because the prednisone

## 2019-10-10 ENCOUNTER — Ambulatory Visit (INDEPENDENT_AMBULATORY_CARE_PROVIDER_SITE_OTHER): Payer: Medicare Other | Admitting: Cardiovascular Disease

## 2019-10-10 ENCOUNTER — Encounter: Payer: Self-pay | Admitting: Cardiovascular Disease

## 2019-10-10 ENCOUNTER — Other Ambulatory Visit: Payer: Self-pay

## 2019-10-10 VITALS — BP 125/74 | HR 92 | Ht 69.0 in | Wt 203.4 lb

## 2019-10-10 DIAGNOSIS — I5032 Chronic diastolic (congestive) heart failure: Secondary | ICD-10-CM | POA: Diagnosis not present

## 2019-10-10 DIAGNOSIS — I10 Essential (primary) hypertension: Secondary | ICD-10-CM | POA: Diagnosis not present

## 2019-10-10 DIAGNOSIS — I4819 Other persistent atrial fibrillation: Secondary | ICD-10-CM

## 2019-10-10 DIAGNOSIS — J9611 Chronic respiratory failure with hypoxia: Secondary | ICD-10-CM | POA: Diagnosis not present

## 2019-10-10 DIAGNOSIS — I25810 Atherosclerosis of coronary artery bypass graft(s) without angina pectoris: Secondary | ICD-10-CM | POA: Diagnosis not present

## 2019-10-10 DIAGNOSIS — D750 Familial erythrocytosis: Secondary | ICD-10-CM

## 2019-10-10 NOTE — Patient Instructions (Addendum)
Medication Instructions:  NO CHANGES *If you need a refill on your cardiac medications before your next appointment, please call your pharmacy*  Lab Work: NONE NEEDED.  Testing/Procedures: Not needed  Follow-Up: At [facility], you and your health needs are our priority.  As part of our continuing mission to provide you with exceptional heart care, we have created designated Provider Care Teams.  These Care Teams include your primary Cardiologist (physician) and Advanced Practice Providers (APPs -  Physician Assistants and Nurse Practitioners) who all work together to provide you with the care you need, when you need it.  Your next appointment:   6 month(s)  The format for your next appointment:   In Person  Provider:   Dr. Oval Linsey

## 2019-10-10 NOTE — Progress Notes (Signed)
Cardiology Office Note   Date:  10/10/2019   ID:  Bradley SearlesJohn J Faro Sr., DOB 09/28/1941, MRN [id number]  PCP:  Wanda PlumpPaz, Jose E, MD  Cardiologist:   Chilton Siiffany Blackburn, MD   No chief complaint on file.    History of Present Illness: Bradley SearlesJohn J Petitjean Sr. is a 78 y.o. male with chronic atrial fibrillation s/p AVN ablation and CRT-P, chronic diastolic heart failure, mild aortic stenosis, CAD, hypertension, hyperlipidemia, prior ICH (no anticoagulation), COPD, and PAD who presents for follow-up. He previously underwent left heart catheterization in 2005 and in 2018 and had no significant coronary artery disease. He had a BiV pacemaker and AV nodal ablation 09/08/11. At that time he had reduced systolic function in the setting of atrial fibrillation with RVR. After placement of the biventricular pacemaker his ejection fraction improved to 50-55% in 2017. He was admitted 02/2017 with COPD exacerbation and acute on chronic diastolic heart failure. BNP was 491 and TEE had pleural effusions on chest x-ray. He improved with diuresis. Echocardiogram at that time revealed LVEF 55-60%, mild mitral regurgitation, and PA SP 55 mmHg. He followed up with Azalee CourseHao Meng on 03/09/17 and was doing well.  Bradley [name] was admitted 12/2018 with a COPD exacerbation  He developed increasing shortness of breath and cough.  In the hospital he required BiPAP, IV steroids and antibiotics.  He was again admitted 04/2019 with acute respiratory failure due to both COPD exacerbation and acute on chronic diastolic heart failure.  She followed up with Corine ShelterLuke Kilroy, PA-C on 05/2019 and his weight was stable at 206 pounds.    Since his last appointment Bradley [name] was admitted 09/2019 with a COPD exacerbation requiring antibiotics and steroids.  He reports that he is currently feeling great.  He has no chest pain and his breathing is stable.  He enjoyed spending Christmas with his grandkids.  He has no chest pain or edema.  He denies orthopnea and his weight  has been stable.  BP has been at goal.  Bradley [name] has two great grandchildren who are his pride and joy Bradley [name](Bradley [name] and Bradley [name]).  His son was a Engineer, agriculturalavy Seal.  He died of a drug overdose in 2018.   Past Medical History:  Diagnosis Date  . Abnormal CT scan, chest 2012   CT chest, several lymphadenopathies. Sees pulmonary  . Anemia    intermittent  . Arthritis    "?back" (09/19/2018)  . Atrial fibrillation (HCC)    s/p AV node ablation & BiV PPM implantation 09/08/11 (op dictation pending)  . BPH (benign prostatic hyperplasia)    Saw Dr Wanda PlumpHumphries 2004, normal renal u/s  . Bronchitis 08/26/2017  . CAD (coronary artery disease)   . CHF (congestive heart failure) (HCC)    Thought primarily to be non-systolic although EF down (EF 78-29%45-50% 12/2010, down to 35-40% 09/05/11), cath 2008 with no CAD, nuclear study 07/2011 showing Small area of reversibility in the distal ant/lat wall the left ventricle suspicious for ischemia/septal wall HK but felt to be low risk  (per D/C Summary 07/2011)  . Chronic bronchitis (HCC)    "get it ~ q yr"  . Heart murmur   . HLD (hyperlipidemia)   . HTN (hypertension)   . 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 Insomnia   . Migraines    "very very rare"  . On home oxygen therapy    "2L; 24/7" (09/19/2018)  . Pacemaker   . Peripheral vascular disease (HCC)    ??  .  Pleural effusion 2008   S/p decortication  . Pulmonary HTN ([place])    per cath 2008  . Type II diabetes mellitus ([place]) 1999    Past Surgical History:  Procedure Laterality Date  . BI-VENTRICULAR PACEMAKER INSERTION Left 09/08/2011   Procedure: BI-VENTRICULAR PACEMAKER INSERTION (CRT-P);  Surgeon: Evans Lance, MD;  Location[facility] CATH LAB;  Service: Cardiovascular;  Laterality: Left;  . CARDIAC CATHETERIZATION     "couple times; never had balloon or stent" (09/19/2018)  . CATARACT EXTRACTION W/ INTRAOCULAR LENS  IMPLANT, BILATERAL Bilateral 08/2018  . COLONOSCOPY   03/10/11   normal  . INSERT / REPLACE / REMOVE PACEMAKER  09/08/11   pacemaker placement  . LUNG DECORTICATION    . PLEURAL SCARIFICATION    . pneumothorax with fibrothorax  ~ 2010  . TONSILLECTOMY     "as a kid"      Current Outpatient Medications  Medication Sig Dispense Refill  . acetaminophen (TYLENOL) 500 MG tablet Take 500 mg by mouth every 6 (six) hours as needed for headache.    Marland Kitchen amLODipine (NORVASC) 10 MG tablet Take 1 tablet (10 mg total) by mouth daily. 90 tablet 3  . aspirin 81 MG tablet Take 81 mg by mouth every morning.     Marland Kitchen atorvastatin (LIPITOR) 20 MG tablet Take 1 tablet (20 mg total) by mouth at bedtime. 90 tablet 3  . calcium-vitamin D (OSCAL WITH D) 500-200 MG-UNIT tablet Take 1 tablet by mouth every morning.    . carvedilol (COREG) 25 MG tablet Take 1 tablet (25 mg total) by mouth 2 (two) times daily with a meal. 180 tablet 3  . diphenhydramine-acetaminophen (TYLENOL PM EXTRA STRENGTH) 25-500 MG TABS tablet Take 1 tablet by mouth at bedtime.    . furosemide (LASIX) 40 MG tablet Take 1 tablet (40 mg total) by mouth 2 (two) times daily.    Marland Kitchen ipratropium-albuterol (DUONEB) 0.5-2.5 (3) MG/3ML SOLN Take 3 mLs by nebulization 2 (two) times daily as needed. 75 mL 0  . Lancets (ONETOUCH ULTRASOFT) lancets Check blood sugars no more than twice daily 200 each 12  . losartan (COZAAR) 100 MG tablet Take 0.5 tablets (50 mg total) by mouth at bedtime. 45 tablet 3  . Multiple Vitamins-Minerals (MULTIVITAMINS THER. W/MINERALS) TABS tablet Take 1 tablet by mouth daily. 30 each 5  . nitroGLYCERIN (NITROSTAT) 0.4 MG SL tablet Place 1 tablet (0.4 mg total) under the tongue every 5 (five) minutes x 3 doses as needed for chest pain. 25 tablet 3  . pantoprazole (PROTONIX) 40 MG tablet Take 1 tablet (40 mg total) by mouth every morning. 90 tablet 3  . sitaGLIPtin (JANUVIA) 100 MG tablet Take 1 tablet (100 mg total) by mouth daily. 90 tablet 1  . tamsulosin (FLOMAX) 0.4 MG CAPS capsule Take  1 capsule (0.4 mg total) by mouth daily after supper. 90 capsule 3  . budesonide (PULMICORT) 0.5 MG/2ML nebulizer solution Take 2 mLs (0.5 mg total) by nebulization 2 (two) times daily for 30 days. 120 mL 0   No current facility-administered medications for this visit.    Allergies:   Hydrocodone, Tramadol, Tadalafil, and Avelox [moxifloxacin hydrochloride]    Social History:  The patient  reports that he quit smoking about 39 years ago. His smoking use included cigarettes and cigars. He has a 62.50 pack-year smoking history. He has never used smokeless tobacco. He reports previous alcohol use. He reports that he does not use drugs.   Family History:  The patient's  family history includes Breast cancer in his maternal aunt; Coronary artery disease in his father; Diabetes in his father; Drug abuse in his son; Polycythemia in his mother.    ROS:  Please see the history of present illness.   Otherwise, review of systems are positive for none.   All other systems are reviewed and negative.    PHYSICAL EXAM: VS:  BP 125/74   Pulse 92   Ht 5\' 9"  (1.753 m)   Wt 203 lb 6.4 oz (92.3 kg)   SpO2 (!) 83%   BMI 30.04 kg/m  , BMI Body mass index is 30.04 kg/m. GENERAL:  Well appearing HEENT: Pupils equal round and reactive, fundi not visualized, oral mucosa unremarkable.  Edentulous.   NECK:  No jugular venous distention, waveform within normal limits, carotid upstroke brisk and symmetric, no bruits LUNGS:  Clear to auscultation bilaterally HEART:  RRR.  PMI not displaced or sustained,S1 and S2 within normal limits, no S3, no S4, no clicks, no rubs, no murmurs ABD:  Flat, positive bowel sounds normal in frequency in pitch, no bruits, no rebound, no guarding, no midline pulsatile mass, no hepatomegaly, no splenomegaly EXT:  2 plus pulses throughout, no edema, no cyanosis no clubbing SKIN:  No rashes no nodules NEURO:  Cranial nerves II through XII grossly intact, motor grossly intact  throughout PSYCH:  Cognitively intact, oriented to person place and time   EKG:  EKG is not ordered today. The ekg ordered 05/26/17 demonstrates atrial fibrillation.  Ventricular paced at 75 bpm 08/26/17: VP.  Rate 82 bpm.    Echo 05/26/18: Study Conclusions  - Left ventricle: The cavity size was normal. Wall thickness was   normal. Systolic function was normal. The estimated ejection   fraction was in the range of 55% to 60%. Although no diagnostic   regional wall motion abnormality was identified, this possibility   cannot be completely excluded on the basis of this study. The   study was not technically sufficient to allow evaluation of LV   diastolic dysfunction due to atrial fibrillation. - Aortic valve: There was no stenosis. - Mitral valve: Moderately calcified annulus. There was trivial   regurgitation. - Left atrium: The atrium was mildly dilated. - Right ventricle: The cavity size was moderately dilated. Pacer   wire or catheter noted in right ventricle. Systolic function was   normal. - Right atrium: The atrium was severely dilated. - Tricuspid valve: Peak RV-RA gradient (S): 32 mm Hg. - Pulmonary arteries: PA peak pressure: 40 mm Hg (S). - Systemic veins: IVC measured 2.2 cm with > 50% respirophasic   variation, suggesting RA pressure 8 mmHg.  Impressions:  - The patient appeared to be in atrial fibrillation. Normal LV size   and systolic function, EF 55-60%. Moderately dilated RV with   normal systolic function. Mild pulmonary hypertension. Biatrial   enlargement. No significant valvular abnormalities.  Recent Labs: 05/09/2019: Magnesium 2.1 09/13/2019: B Natriuretic Peptide 165.6 09/25/2019: ALT 24; BUN 15; Creatinine, Ser 0.80; Hemoglobin 14.1; Platelets 189.0; Potassium 4.8; Sodium 137    Lipid Panel    Component Value Date/Time   CHOL 105 12/12/2018 1409   TRIG 120.0 12/12/2018 1409   HDL 31.50 (L) 12/12/2018 1409   CHOLHDL 3 12/12/2018 1409   VLDL  24.0 12/12/2018 1409   LDLCALC 50 12/12/2018 1409      Wt Readings from Last 3 Encounters:  10/10/19 203 lb 6.4 oz (92.3 kg)  09/25/19 207 lb (93.9 kg)  09/19/19 208  lb 1.6 oz (94.4 kg)      ASSESSMENT AND PLAN:  # Chronic diastolic heart failure:  # Hypertensive heart disesase: Mr. Gelb is euvolemic and doing well.  He is doing well on his current dose of lasix.  His breathing has nearly returned to baseline after his COPD exacerbation. Continue amlodipine, carvedilol, losartan, and furosemide.  # Longstanding Persistent Atrial fibrillation: S/p AV node ablation with CRT-P.  Asymptomatic. Continue carvedilol. Mr. Walsingham is not on anticoagulation due to prior intracranial bleed.He continues to tolerate aspirin.   # Chronic hypoxia:  # COPD with frequent hospitalizations: Continue supplemental oxygen at rest and with exertion.   Current medicines are reviewed at length with the patient today.  The patient does not have concerns regarding medicines.  The following changes have been made:  none  Labs/ tests ordered today include:  No orders of the defined types were placed in this encounter.    Disposition:   FU with Rhyatt Muska C. Duke Salvia, MD, [facility] in 6 months.     Signed, Deone Omahoney C. Duke Salvia, MD, [facility]  10/10/2019 12:17 PM    [facility]

## 2019-10-13 ENCOUNTER — Other Ambulatory Visit: Payer: Self-pay | Admitting: Internal Medicine

## 2019-10-15 NOTE — Telephone Encounter (Signed)
Lasix refill sent, call the patient and clarified the dose, take it twice a day

## 2019-10-25 ENCOUNTER — Other Ambulatory Visit: Payer: Self-pay | Admitting: Internal Medicine

## 2019-10-26 ENCOUNTER — Ambulatory Visit: Payer: Medicare Other | Admitting: Internal Medicine

## 2019-11-01 ENCOUNTER — Other Ambulatory Visit: Payer: Self-pay | Admitting: Internal Medicine

## 2019-11-22 ENCOUNTER — Other Ambulatory Visit: Payer: Self-pay | Admitting: Internal Medicine

## 2019-11-26 ENCOUNTER — Ambulatory Visit (HOSPITAL_BASED_OUTPATIENT_CLINIC_OR_DEPARTMENT_OTHER)
Admission: RE | Admit: 2019-11-26 | Discharge: 2019-11-26 | Disposition: A | Discharge status: Home / Self Care | Payer: Medicare Other | Source: Ambulatory Visit | Attending: Internal Medicine | Admitting: Internal Medicine

## 2019-11-26 ENCOUNTER — Other Ambulatory Visit: Payer: Self-pay

## 2019-11-26 ENCOUNTER — Ambulatory Visit (INDEPENDENT_AMBULATORY_CARE_PROVIDER_SITE_OTHER): Payer: Medicare Other | Admitting: Internal Medicine

## 2019-11-26 ENCOUNTER — Encounter: Payer: Self-pay | Admitting: Internal Medicine

## 2019-11-26 VITALS — BP 109/65 | HR 91 | Temp 96.2°F | Resp 20 | Ht 69.0 in | Wt 205.4 lb

## 2019-11-26 DIAGNOSIS — E118 Type 2 diabetes mellitus with unspecified complications: Secondary | ICD-10-CM

## 2019-11-26 DIAGNOSIS — I10 Essential (primary) hypertension: Secondary | ICD-10-CM | POA: Diagnosis not present

## 2019-11-26 DIAGNOSIS — I480 Paroxysmal atrial fibrillation: Secondary | ICD-10-CM | POA: Diagnosis not present

## 2019-11-26 DIAGNOSIS — I5033 Acute on chronic diastolic (congestive) heart failure: Secondary | ICD-10-CM

## 2019-11-26 DIAGNOSIS — J189 Pneumonia, unspecified organism: Secondary | ICD-10-CM

## 2019-11-26 DIAGNOSIS — E785 Hyperlipidemia, unspecified: Secondary | ICD-10-CM

## 2019-11-26 DIAGNOSIS — J440 Chronic obstructive pulmonary disease with acute lower respiratory infection: Secondary | ICD-10-CM

## 2019-11-26 LAB — LIPID PANEL
Cholesterol: 144 mg/dL (ref 0–200)
HDL: 36.5 mg/dL — ABNORMAL LOW (ref >39.00)
LDL Cholesterol: 84 mg/dL (ref 0–99)
NonHDL: 107.7
Total CHOL/HDL Ratio: 4
Triglycerides: 120 mg/dL (ref 0.0–149.0)
VLDL: 24 mg/dL (ref 0.0–40.0)

## 2019-11-26 LAB — HEMOGLOBIN A1C: Hgb A1c MFr Bld: 6.6 % — ABNORMAL HIGH (ref 4.6–6.5)

## 2019-11-26 MED ORDER — BUDESONIDE 0.5 MG/2ML IN SUSP: 0.5000 mg | Freq: Two times a day (BID) | RESPIRATORY_TRACT | 1 refills | Status: DC

## 2019-11-26 MED ORDER — IPRATROPIUM-ALBUTEROL 0.5-2.5 (3) MG/3ML IN SOLN: 3.0000 mL | Freq: Two times a day (BID) | RESPIRATORY_TRACT | 1 refills | Status: DC | PRN

## 2019-11-26 NOTE — Progress Notes (Signed)
Pre visit review using our clinic review tool, if applicable. No additional management support is needed unless otherwise documented below in the visit note. 

## 2019-11-26 NOTE — Patient Instructions (Addendum)
Please schedule Medicare Wellness with Lawanna Kobus.   GO TO THE LAB : Get the blood work     GO TO THE FRONT DESK Come back for a checkup in 4 months, please make an appointment    Nebulizations: Budesonide twice a day every day  Ipratropium-albuterol: 3-4 times a day as needed only, if you have cough, chest congestion.     We are committed to keeping you informed about the COVID-19 vaccine.  As the vaccine continues to become available for each phase, we will ensure that patients who meet the criteria receive the information they need to access vaccination opportunities. Continue to check your MyChart account and [email or web address] for updates. Please review the Phase 1b information below.  [facility] COVID-19 Vaccination Clinic - [place] On Tuesday, Jan. 19, the [facility]) and [facility] began large-scale COVID-19 vaccinations at the [facility]. The vaccinations are appointment only and for those 65 and older.  Walk-ins will not be accepted.  All appointments are currently filled. Please join our waiting list for the next available appointments. We will contact you when appointments become available. Please do not sign up more than once.  Join Our Waiting List  Our daily vaccination capacity will continue to increase, including expansion of our [employer] site, increasing mobile clinics across our service region and plans to open COVID-19 vaccination clinics in [place] and [place] counties.  Other Vaccination Opportunities in Our Area We are also working in partnership with county health agencies in our service counties to ensure continuing vaccination availability in the weeks and months ahead. Learn more about each county's vaccination efforts in the website links below:   [place]  [place]  [place]  [facility] [place]'s phase 1b vaccination guidelines,  prioritizing those 65 and over as the next eligible group to receive the COVID-19 vaccine, are detailed at [email or web address]   Vaccine Safety and Effectiveness Clinical trials for the Pfizer COVID-19 vaccine involved 42,000 people and showed that the vaccine is more than 95% effective in preventing COVID-19 with no serious safety concerns. Similar results have been reported for the Moderna COVID-19 vaccine. Side effects reported in the Pfizer clinical trials include a sore arm at the injection site, fatigue, headache, chills and fever. While side effects from the Pfizer COVID-19 vaccine are higher than for a typical flu vaccine, they are lower in many ways than side effects from the leading vaccine to prevent shingles. Side effects are signs that a vaccine is working and are related to your immune system being stimulated to produce antibodies against infection. Side effects from vaccination are far less significant than health impacts from COVID-19.  Staying Informed Pharmacists, infectious disease doctors, critical care nurses and other experts at [facility] continue to speak publicly through media interviews and direct communication with our patients and communities about the safety, effectiveness and importance of vaccines to eliminate COVID-19. In addition, reliable information on vaccine safety, effectiveness, side effects and more is available on the following websites:  [place] Department of Health and Human Services COVID-19 Vaccine Information Website.  U.S. Centers for Disease Control and Prevention COVID-19 Librarian, academic.  Staying Safe We agree with the CDC on what we can do to help our communities get back to normal: Getting "back to normal" is going to take all of our tools. If we use all the tools we have, we stand the best chance of getting our families, communities,  schools and workplaces "back to normal" sooner:  Get vaccinated as soon as vaccines become  available within the phase of the state's vaccination rollout plan for which you meet the eligibility criteria.  Wear a mask.  Stay 6 feet from others and avoid crowds.  Wash hands often.  For our most current information, please visit [email or web address].

## 2019-11-26 NOTE — Progress Notes (Signed)
Subjective:    Patient ID: Bradley Sit., male    DOB: 1940-10-29, 79 y.o.   MRN: [id number]  DOS:  11/26/2019 Type of visit - description: ROV Since the last office visit he is doing well. Today we addressed oxygen supplementation, COPD, recent pneumonia, CHF. Note from cardiology reviewed  Wt Readings from Last 3 Encounters:  11/26/19 205 lb 6 oz (93.2 kg)  10/10/19 203 lb 6.4 oz (92.3 kg)  09/25/19 207 lb (93.9 kg)      Review of Systems Denies edema No nausea, vomiting, diarrhea.  No blood in the stools.  Past Medical History:  Diagnosis Date  . Abnormal CT scan, chest 2012   CT chest, several lymphadenopathies. Sees pulmonary  . Anemia    intermittent  . Arthritis    "?back" (09/19/2018)  . Atrial fibrillation (HCC)    s/p AV node ablation & BiV PPM implantation 09/08/11 (op dictation pending)  . BPH (benign prostatic hyperplasia)    Saw Dr [name] 2004, normal renal u/s  . Bronchitis 08/26/2017  . CAD (coronary artery disease)   . CHF (congestive heart failure) (HCC)    Thought primarily to be non-systolic although EF down (EF 59-74% 12/2010, down to 35-40% 09/05/11), cath 2008 with no CAD, nuclear study 07/2011 showing Small area of reversibility in the distal ant/lat wall the left ventricle suspicious for ischemia/septal wall HK but felt to be low risk  (per D/C Summary 07/2011)  . Chronic bronchitis (HCC)    "get it ~ q yr"  . Heart murmur   . HLD (hyperlipidemia)   . HTN (hypertension)   . 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 Insomnia   . Migraines    "very very rare"  . On home oxygen therapy    "2L; 24/7" (09/19/2018)  . Pacemaker   . Peripheral vascular disease (HCC)    ??  . Pleural effusion 2008   S/p decortication  . Pulmonary HTN (HCC)    per cath 2008  . Type II diabetes mellitus (HCC) 1999    Past Surgical History:  Procedure Laterality Date  . BI-VENTRICULAR PACEMAKER INSERTION Left 09/08/2011    Procedure: BI-VENTRICULAR PACEMAKER INSERTION (CRT-P);  Surgeon: Marinus Maw, MD;  Location: [facility] CATH LAB;  Service: Cardiovascular;  Laterality: Left;  . CARDIAC CATHETERIZATION     "couple times; never had balloon or stent" (09/19/2018)  . CATARACT EXTRACTION W/ INTRAOCULAR LENS  IMPLANT, BILATERAL Bilateral 08/2018  . COLONOSCOPY  03/10/11   normal  . INSERT / REPLACE / REMOVE PACEMAKER  09/08/11   pacemaker placement  . LUNG DECORTICATION    . PLEURAL SCARIFICATION    . pneumothorax with fibrothorax  ~ 2010  . TONSILLECTOMY     "as a kid"     Allergies as of 11/26/2019      Reactions   Hydrocodone Other (See Comments)   "given to him in the hospital; went thru withdrawals once home; dr said not to take it again" (09/27/2012)   Tramadol Other (See Comments)   "given to him in the hospital; went thru withdrawals once home; dr said not to take it again" (09/27/2012)   Tadalafil Other (See Comments)   headache, backache   Avelox [moxifloxacin Hydrochloride] Itching, Other (See Comments)   Headache      Medication List       Accurate as of November 26, 2019 11:59 PM. If you have any questions, ask your nurse or doctor.  acetaminophen 500 MG tablet Commonly known as: TYLENOL Take 500 mg by mouth every 6 (six) hours as needed for headache.   amLODipine 10 MG tablet Commonly known as: NORVASC TAKE 1 TABLET DAILY   aspirin 81 MG tablet Take 81 mg by mouth every morning.   atorvastatin 20 MG tablet Commonly known as: LIPITOR TAKE 1 TABLET AT BEDTIME   budesonide 0.5 MG/2ML nebulizer solution Commonly known as: PULMICORT Take 2 mLs (0.5 mg total) by nebulization 2 (two) times daily.   calcium-vitamin D 500-200 MG-UNIT tablet Commonly known as: OSCAL WITH D Take 1 tablet by mouth every morning.   carvedilol 25 MG tablet Commonly known as: COREG Take 1 tablet (25 mg total) by mouth 2 (two) times daily with a meal.   furosemide 40 MG tablet Commonly known as:  LASIX Take 2 tablets daily. On Monday, Wednesday and Friday- take 2 tablets in the morning and 1 in the afternoon What changed:   how much to take  how to take this  when to take this  additional instructions Changed by: Kathlene November, MD   ipratropium-albuterol 0.5-2.5 (3) MG/3ML Soln Commonly known as: DUONEB Take 3 mLs by nebulization 2 (two) times daily as needed.   losartan 100 MG tablet Commonly known as: COZAAR Take 0.5 tablets (50 mg total) by mouth at bedtime.   multivitamins ther. w/minerals Tabs tablet Take 1 tablet by mouth daily.   nitroGLYCERIN 0.4 MG SL tablet Commonly known as: NITROSTAT Place 1 tablet (0.4 mg total) under the tongue every 5 (five) minutes x 3 doses as needed for chest pain.   onetouch ultrasoft lancets Check blood sugars no more than twice daily   pantoprazole 40 MG tablet Commonly known as: PROTONIX Take 1 tablet (40 mg total) by mouth every morning.   sitaGLIPtin 100 MG tablet Commonly known as: Januvia Take 1 tablet (100 mg total) by mouth daily.   tamsulosin 0.4 MG Caps capsule Commonly known as: FLOMAX Take 1 capsule (0.4 mg total) by mouth daily after supper.   Tylenol PM Extra Strength 25-500 MG Tabs tablet Generic drug: diphenhydramine-acetaminophen Take 1 tablet by mouth at bedtime.             Objective:   Physical Exam BP 109/65 (BP Location: Right Arm, Patient Position: Sitting, Cuff Size: Normal)   Pulse 91   Temp (!) 96.2 F (35.7 C) (Temporal)   Resp 20   Ht 5\' 9"  (1.753 m)   Wt 205 lb 6 oz (93.2 kg)   SpO2 (!) 88% Comment: w/ 3L  BMI 30.33 kg/m  General:   Well developed, NAD, BMI noted. HEENT:  Normocephalic . Face symmetric, atraumatic Lungs:  Slight decreased breath sounds, few dry crackles at the bases? Normal respiratory effort, no intercostal retractions, no accessory muscle use. Heart: Regular  Lower extremities: no pretibial edema bilaterally  Skin: Not pale. Not jaundice Neurologic:    alert & oriented X3.  Speech normal, gait appropriate for age and unassisted Psych--  Cognition and judgment appear intact.  Cooperative with normal attention span and concentration.  Behavior appropriate. No anxious or depressed appearing.      Assessment     Assessment  DM HTN Hyperlipidemia GERD Anxiety- insomnia  BPH Cardiovascular:Dr Lovena Le  --  Cath x 2 [id number]: no significant CAD -- CHF, non-ischemic  --Atrial fibrillation; h/o AV node ablation, has a Pacemaker --h/o IC bleeding: Not anticoagulated --Peripheral vascular disease? Pulmonary: Dr Melvyn Novas, last OV 10-18-2014, f/u prn --COPD, severe, chronic  resp failure, night O2 prn, has portable O2  --Pleural effusion s/p decortication --Pulmonary hypertension  Stasis dermatitis   PLAN: DM: Continue Januvia, check A1c HTN: Seems well controlled on amlodipine, carvedilol, Lasix, losartan.  Last BMP satisfactory. CHF/ AFib: saw cards 10/10/2019, stable, checking labs  COPD, chronic hypoxia: Good compliance with 3 L of oxygen 24/7, O2 sat at home varies, typically in the 90s, decreased with exertion but not to the 80s in most cases. Community-acquired pneumonia: Reports she is fully back to normal, check a chest x-ray. COPD: Refill budesonide, has not been needing ipratropium albuterol however needs a refill as well, encouraged to use that as needed. Prevention: Encouraged to get help from his family to secure an appointment for Covid vaccination. RTC 4 months   This visit occurred during the SARS-CoV-2 public health emergency.  Safety protocols were in place, including screening questions prior to the visit, additional usage of staff PPE, and extensive cleaning of exam room while observing appropriate contact time as indicated for disinfecting solutions.

## 2019-11-27 ENCOUNTER — Telehealth: Payer: Self-pay | Admitting: *Deleted

## 2019-11-27 ENCOUNTER — Ambulatory Visit (INDEPENDENT_AMBULATORY_CARE_PROVIDER_SITE_OTHER): Payer: Medicare Other | Admitting: *Deleted

## 2019-11-27 DIAGNOSIS — I495 Sick sinus syndrome: Secondary | ICD-10-CM

## 2019-11-27 LAB — CUP PACEART REMOTE DEVICE CHECK
Battery Remaining Longevity: 33 mo
Battery Voltage: 2.93 V
Brady Statistic AP VP Percent: 0 %
Brady Statistic AP VS Percent: 0 %
Brady Statistic AS VP Percent: 98.97 %
Brady Statistic AS VS Percent: 1.03 %
Brady Statistic RA Percent Paced: 0 %
Brady Statistic RV Percent Paced: 99.31 %
Date Time Interrogation Session: 20210216141658
Implantable Lead Implant Date: 20121128
Implantable Lead Implant Date: 20121128
Implantable Lead Location: 753858
Implantable Lead Location: 753860
Implantable Lead Model: 4194
Implantable Lead Model: 5076
Implantable Pulse Generator Implant Date: 20121128
Lead Channel Impedance Value: 342 Ohm
Lead Channel Impedance Value: 399 Ohm
Lead Channel Impedance Value: 399 Ohm
Lead Channel Impedance Value: 4047 Ohm
Lead Channel Impedance Value: 4047 Ohm
Lead Channel Impedance Value: 494 Ohm
Lead Channel Impedance Value: 513 Ohm
Lead Channel Impedance Value: 551 Ohm
Lead Channel Impedance Value: 589 Ohm
Lead Channel Pacing Threshold Amplitude: 0.75 V
Lead Channel Pacing Threshold Amplitude: 1.125 V
Lead Channel Pacing Threshold Pulse Width: 0.4 ms
Lead Channel Pacing Threshold Pulse Width: 0.4 ms
Lead Channel Sensing Intrinsic Amplitude: 12.75 mV
Lead Channel Sensing Intrinsic Amplitude: 12.75 mV
Lead Channel Setting Pacing Amplitude: 2 V
Lead Channel Setting Pacing Amplitude: 2.25 V
Lead Channel Setting Pacing Pulse Width: 0.4 ms
Lead Channel Setting Pacing Pulse Width: 0.4 ms
Lead Channel Setting Sensing Sensitivity: 4 mV

## 2019-11-27 NOTE — Telephone Encounter (Signed)
Scheduled PPM transmission from 11/27/19 reviewed. 7 NSVT events, longest 8sec on 10/15/19 at 14:22. LVEF 55-60% as of 09/2018, on carvedilol 25mg  BID per med list. Patient is overdue for f/u with Dr. . Transmission exported to Dr. Ladona Ridgel for review.  LMOVM requesting call back to DC. Direct number and office hours provided.

## 2019-11-28 MED ORDER — FUROSEMIDE 40 MG PO TABS: ORAL_TABLET | ORAL | 0 refills | Status: DC

## 2019-11-28 NOTE — Telephone Encounter (Signed)
Spoke with patient. He denies any symptoms with episodes. Reports compliance with cardiac medications. Scheduled for f/u with Dr. Ladona Ridgel on 01/16/20. Pt appreciative of call and denies questions or concerns at this time.

## 2019-11-28 NOTE — Assessment & Plan Note (Signed)
DM: Continue Januvia, check A1c HTN: Seems well controlled on amlodipine, carvedilol, Lasix, losartan.  Last BMP satisfactory. CHF/ AFib: saw cards 10/10/2019, stable, checking labs  COPD, chronic hypoxia: Good compliance with 3 L of oxygen 24/7, O2 sat at home varies, typically in the 90s, decreased with exertion but not to the 80s in most cases. Community-acquired pneumonia: Reports she is fully back to normal, check a chest x-ray. COPD: Refill budesonide, has not been needing ipratropium albuterol however needs a refill as well, encouraged to use that as needed. Prevention: Encouraged to get help from his family to secure an appointment for Covid vaccination. RTC 4 months

## 2019-11-28 NOTE — Progress Notes (Signed)
PPM Remote  

## 2019-11-29 ENCOUNTER — Other Ambulatory Visit: Payer: Self-pay | Admitting: Internal Medicine

## 2019-12-17 ENCOUNTER — Other Ambulatory Visit (INDEPENDENT_AMBULATORY_CARE_PROVIDER_SITE_OTHER): Payer: Medicare Other

## 2019-12-17 ENCOUNTER — Other Ambulatory Visit: Payer: Self-pay | Admitting: Emergency Medicine

## 2019-12-17 ENCOUNTER — Other Ambulatory Visit: Payer: Self-pay

## 2019-12-17 DIAGNOSIS — I5033 Acute on chronic diastolic (congestive) heart failure: Secondary | ICD-10-CM

## 2019-12-17 LAB — BASIC METABOLIC PANEL
BUN: 9 mg/dL (ref 6–23)
CO2: 35 mEq/L — ABNORMAL HIGH (ref 19–32)
Calcium: 8.7 mg/dL (ref 8.4–10.5)
Chloride: 99 mEq/L (ref 96–112)
Creatinine, Ser: 0.78 mg/dL (ref 0.40–1.50)
GFR: 96.05 mL/min (ref >60.00)
Glucose, Bld: 130 mg/dL — ABNORMAL HIGH (ref 70–99)
Potassium: 4.5 mEq/L (ref 3.5–5.1)
Sodium: 141 mEq/L (ref 135–145)

## 2019-12-27 ENCOUNTER — Other Ambulatory Visit: Payer: Self-pay

## 2019-12-27 MED ORDER — FUROSEMIDE 40 MG PO TABS: ORAL_TABLET | ORAL | 1 refills | Status: DC

## 2019-12-28 ENCOUNTER — Telehealth: Payer: Self-pay

## 2019-12-28 NOTE — Telephone Encounter (Signed)
Received fax from [facility] requesting clarification on furosemide 40mg .   furosemide (LASIX) 40 MG tablet 270 tablet 1 12/27/2019    Sig: Take 2 tablets daily. On Monday, Wednesday and Friday- take 2 tablets in the morning and 1 in the afternoon    Called [facility]- [phone]- spoke w/ [phone] D- clarification given.

## 2020-01-03 ENCOUNTER — Ambulatory Visit: Payer: Medicare Other | Admitting: Cardiovascular Disease

## 2020-01-16 ENCOUNTER — Encounter: Payer: Self-pay | Admitting: Internal Medicine

## 2020-01-16 ENCOUNTER — Other Ambulatory Visit: Payer: Self-pay

## 2020-01-16 ENCOUNTER — Ambulatory Visit (INDEPENDENT_AMBULATORY_CARE_PROVIDER_SITE_OTHER): Payer: Medicare Other | Admitting: Internal Medicine

## 2020-01-16 VITALS — BP 134/80 | HR 94 | Ht 69.0 in | Wt 204.8 lb

## 2020-01-16 DIAGNOSIS — I482 Chronic atrial fibrillation, unspecified: Secondary | ICD-10-CM

## 2020-01-16 DIAGNOSIS — I428 Other cardiomyopathies: Secondary | ICD-10-CM | POA: Diagnosis not present

## 2020-01-16 DIAGNOSIS — Z950 Presence of cardiac pacemaker: Secondary | ICD-10-CM | POA: Diagnosis not present

## 2020-01-16 NOTE — Patient Instructions (Signed)
Medication Instructions:  Your physician recommends that you continue on your current medications as directed. Please refer to the Current Medication list given to you today.  Labwork: None ordered.  Testing/Procedures: None ordered.  Follow-Up: Your physician wants you to follow-up in: one year with Dr. Ladona Ridgel.   You will receive a reminder letter in the mail two months in advance. If you don't receive a letter, please call our office to schedule the follow-up appointment.  Remote monitoring is used to monitor your Pacemaker from home. This monitoring reduces the number of office visits required to check your device to one time per year. It allows [place] to keep an eye on the functioning of your device to ensure it is working properly. You are scheduled for a device check from home on 02/26/2020. You may send your transmission at any time that day. If you have a wireless device, the transmission will be sent automatically. After your physician reviews your transmission, you will receive a postcard with your next transmission date.  Any Other Special Instructions Will Be Listed Below (If Applicable).  If you need a refill on your cardiac medications before your next appointment, please call your pharmacy.

## 2020-01-16 NOTE — Progress Notes (Signed)
HPI [name] returns today after an almost 2 year absence from our EP clinic. He is a pleasant 79 yo man with peristent and uncontrolled atrial fib and LV dysfunction who underwent PPM insertion and AV node ablation in the past 9 years. He has done well in the interim except for some COPD. He denies palpitations, sob or chest pressure. No edema.  Allergies  Allergen Reactions  . Hydrocodone Other (See Comments)    "given to him in the hospital; went thru withdrawals once home; dr said not to take it again" (09/27/2012)  . Tramadol Other (See Comments)    "given to him in the hospital; went thru withdrawals once home; dr said not to take it again" (09/27/2012)  . Tadalafil Other (See Comments)    headache, backache  . Avelox [Moxifloxacin Hydrochloride] Itching and Other (See Comments)    Headache     Current Outpatient Medications  Medication Sig Dispense Refill  . acetaminophen (TYLENOL) 500 MG tablet Take 500 mg by mouth every 6 (six) hours as needed for headache.    Marland Kitchen amLODipine (NORVASC) 10 MG tablet TAKE 1 TABLET DAILY 90 tablet 3  . aspirin 81 MG tablet Take 81 mg by mouth every morning.     Marland Kitchen atorvastatin (LIPITOR) 20 MG tablet TAKE 1 TABLET AT BEDTIME 90 tablet 3  . budesonide (PULMICORT) 0.5 MG/2ML nebulizer solution Take 2 mLs (0.5 mg total) by nebulization 2 (two) times daily. 360 mL 1  . calcium-vitamin D (OSCAL WITH D) 500-200 MG-UNIT tablet Take 1 tablet by mouth every morning.    . carvedilol (COREG) 25 MG tablet Take 1 tablet (25 mg total) by mouth 2 (two) times daily with a meal. 180 tablet 3  . diphenhydramine-acetaminophen (TYLENOL PM EXTRA STRENGTH) 25-500 MG TABS tablet Take 1 tablet by mouth at bedtime.    . furosemide (LASIX) 40 MG tablet Take 2 tablets daily. On Monday, Wednesday and Friday- take 2 tablets in the morning and 1 in the afternoon 270 tablet 1  . ipratropium-albuterol (DUONEB) 0.5-2.5 (3) MG/3ML SOLN Take 3 mLs by nebulization 2 (two) times  daily as needed. 540 mL 1  . Lancets (ONETOUCH ULTRASOFT) lancets Check blood sugars no more than twice daily 200 each 12  . losartan (COZAAR) 100 MG tablet Take 0.5 tablets (50 mg total) by mouth at bedtime. 45 tablet 3  . Multiple Vitamins-Minerals (MULTIVITAMINS THER. W/MINERALS) TABS tablet Take 1 tablet by mouth daily. 30 each 5  . nitroGLYCERIN (NITROSTAT) 0.4 MG SL tablet Place 1 tablet (0.4 mg total) under the tongue every 5 (five) minutes x 3 doses as needed for chest pain. 25 tablet 3  . pantoprazole (PROTONIX) 40 MG tablet Take 1 tablet (40 mg total) by mouth every morning. 90 tablet 3  . sitaGLIPtin (JANUVIA) 100 MG tablet Take 1 tablet (100 mg total) by mouth daily. 90 tablet 3  . tamsulosin (FLOMAX) 0.4 MG CAPS capsule Take 1 capsule (0.4 mg total) by mouth daily after supper. 90 capsule 3   No current facility-administered medications for this visit.     Past Medical History:  Diagnosis Date  . Abnormal CT scan, chest 2012   CT chest, several lymphadenopathies. Sees pulmonary  . Anemia    intermittent  . Arthritis    "?back" (09/19/2018)  . Atrial fibrillation ([place])    s/p AV node ablation & BiV PPM implantation 09/08/11 (op dictation pending)  . BPH (benign prostatic hyperplasia)    Saw  Dr [name] 2004, normal renal u/s  . Bronchitis 08/26/2017  . CAD (coronary artery disease)   . CHF (congestive heart failure) (HCC)    Thought primarily to be non-systolic although EF down (EF 71-06% 12/2010, down to 35-40% 09/05/11), cath 2008 with no CAD, nuclear study 07/2011 showing Small area of reversibility in the distal ant/lat wall the left ventricle suspicious for ischemia/septal wall HK but felt to be low risk  (per D/C Summary 07/2011)  . Chronic bronchitis (HCC)    "get it ~ q yr"  . Heart murmur   . HLD (hyperlipidemia)   . HTN (hypertension)   . 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 Insomnia   . Migraines    "very very rare"  . On  home oxygen therapy    "2L; 24/7" (09/19/2018)  . Pacemaker   . Peripheral vascular disease (HCC)    ??  . Pleural effusion 2008   S/p decortication  . Pulmonary HTN (HCC)    per cath 2008  . Type II diabetes mellitus (HCC) 1999    ROS:   All systems reviewed and negative except as noted in the HPI.   Past Surgical History:  Procedure Laterality Date  . BI-VENTRICULAR PACEMAKER INSERTION Left 09/08/2011   Procedure: BI-VENTRICULAR PACEMAKER INSERTION (CRT-P);  Surgeon: Marinus Maw, MD;  Location: [facility] CATH LAB;  Service: Cardiovascular;  Laterality: Left;  . CARDIAC CATHETERIZATION     "couple times; never had balloon or stent" (09/19/2018)  . CATARACT EXTRACTION W/ INTRAOCULAR LENS  IMPLANT, BILATERAL Bilateral 08/2018  . COLONOSCOPY  03/10/11   normal  . INSERT / REPLACE / REMOVE PACEMAKER  09/08/11   pacemaker placement  . LUNG DECORTICATION    . PLEURAL SCARIFICATION    . pneumothorax with fibrothorax  ~ 2010  . TONSILLECTOMY     "as a kid"      Family History  Problem Relation Age of Onset  . Diabetes Father   . Coronary artery disease Father   . Polycythemia Mother   . Drug abuse Son   . Breast cancer Maternal Aunt   . Prostate cancer Neg Hx   . Colon cancer Neg Hx      Social History   Socioeconomic History  . Marital status: Married    Spouse name: Bradley [name]  . Number of children: 1  . Years of education: Not on file  . Highest education level: Not on file  Occupational History  . Occupation: retired Publishing rights manager: RETIRED  Tobacco Use  . Smoking status: Former Smoker    Packs/day: 2.50    Years: 25.00    Pack years: 62.50    Types: Cigarettes, Cigars    Quit date: 10/11/1980    Years since quitting: 39.2  . Smokeless tobacco: Never Used  Substance and Sexual Activity  . Alcohol use: Not Currently    Comment: 1 can of beer ever 4-5 months  . Drug use: No  . Sexual activity: Not Currently  Other Topics Concern  . Not on  file  Social History Narrative   Moved from [place] 2003.Marland Kitchen   He lives w/ his wife, Bradley [name]   1 son, substance abuse, passed away 2016-10-15         Social Determinants of Health   Financial Resource Strain:   . Difficulty of Paying Living Expenses:   Food Insecurity:   . Worried About Programme researcher, broadcasting/film/video in the Last  Year:   . Ran Out of Food in the Last Year:   Transportation Needs:   . Freight forwarder (Medical):   Marland Kitchen Lack of Transportation (Non-Medical):   Physical Activity:   . Days of Exercise per Week:   . Minutes of Exercise per Session:   Stress:   . Feeling of Stress :   Social Connections:   . Frequency of Communication with Friends and Family:   . Frequency of Social Gatherings with Friends and Family:   . Attends Religious Services:   . Active Member of Clubs or Organizations:   . Attends Banker Meetings:   Marland Kitchen Marital Status:   Intimate Partner Violence:   . Fear of Current or Ex-Partner:   . Emotionally Abused:   Marland Kitchen Physically Abused:   . Sexually Abused:      BP 134/80   Pulse 94   Ht 5\' 9"  (1.753 m)   Wt 204 lb 12.8 oz (92.9 kg)   SpO2 (!) 86%   BMI 30.24 kg/m   Physical Exam:  Well appearing NAD HEENT: Unremarkable Neck:  No JVD, no thyromegally Lymphatics:  No adenopathy Back:  No CVA tenderness Lungs:  Clear with no wheezes. Breath souinds are reduced through out HEART:  Regular rate rhythm, no murmurs, no rubs, no clicks Abd:  soft, positive bowel sounds, no organomegally, no rebound, no guarding Ext:  2 plus pulses, no edema, no cyanosis, no clubbing Skin:  No rashes no nodules Neuro:  CN II through XII intact, motor grossly intact  DEVICE  Normal device function.  See PaceArt for details.   Assess/Plan: 1. Atrial fib - his rates are well controlled, s/p AV node ablation.  2. BIV PPM - his medtronic biv PPM is working normally. Thresholds are good. 3. HTN - his bp is well controlled. He will maintain a low sodium  diet.  .D

## 2020-02-13 ENCOUNTER — Other Ambulatory Visit: Payer: Self-pay | Admitting: Internal Medicine

## 2020-02-26 ENCOUNTER — Ambulatory Visit (INDEPENDENT_AMBULATORY_CARE_PROVIDER_SITE_OTHER): Payer: Medicare Other | Admitting: *Deleted

## 2020-02-26 DIAGNOSIS — I495 Sick sinus syndrome: Secondary | ICD-10-CM

## 2020-02-26 DIAGNOSIS — I428 Other cardiomyopathies: Secondary | ICD-10-CM

## 2020-02-26 LAB — CUP PACEART REMOTE DEVICE CHECK
Battery Remaining Longevity: 29 mo
Battery Voltage: 2.92 V
Brady Statistic AP VP Percent: 0 %
Brady Statistic AP VS Percent: 0 %
Brady Statistic AS VP Percent: 99.01 %
Brady Statistic AS VS Percent: 0.99 %
Brady Statistic RA Percent Paced: 0 %
Brady Statistic RV Percent Paced: 99.2 %
Date Time Interrogation Session: 20210518131917
Implantable Lead Implant Date: 20121128
Implantable Lead Implant Date: 20121128
Implantable Lead Location: 753858
Implantable Lead Location: 753860
Implantable Lead Model: 4194
Implantable Lead Model: 5076
Implantable Pulse Generator Implant Date: 20121128
Lead Channel Impedance Value: 342 Ohm
Lead Channel Impedance Value: 399 Ohm
Lead Channel Impedance Value: 4047 Ohm
Lead Channel Impedance Value: 4047 Ohm
Lead Channel Impedance Value: 418 Ohm
Lead Channel Impedance Value: 456 Ohm
Lead Channel Impedance Value: 494 Ohm
Lead Channel Impedance Value: 532 Ohm
Lead Channel Impedance Value: 608 Ohm
Lead Channel Pacing Threshold Amplitude: 0.75 V
Lead Channel Pacing Threshold Amplitude: 1.125 V
Lead Channel Pacing Threshold Pulse Width: 0.4 ms
Lead Channel Pacing Threshold Pulse Width: 0.4 ms
Lead Channel Sensing Intrinsic Amplitude: 12.75 mV
Lead Channel Sensing Intrinsic Amplitude: 12.75 mV
Lead Channel Setting Pacing Amplitude: 2 V
Lead Channel Setting Pacing Amplitude: 2.25 V
Lead Channel Setting Pacing Pulse Width: 0.4 ms
Lead Channel Setting Pacing Pulse Width: 0.4 ms
Lead Channel Setting Sensing Sensitivity: 4 mV

## 2020-02-28 NOTE — Progress Notes (Signed)
Remote pacemaker transmission.   

## 2020-03-14 ENCOUNTER — Ambulatory Visit: Payer: Medicare Other | Admitting: *Deleted

## 2020-03-18 ENCOUNTER — Encounter: Payer: Self-pay | Admitting: Cardiology

## 2020-03-18 ENCOUNTER — Telehealth (INDEPENDENT_AMBULATORY_CARE_PROVIDER_SITE_OTHER): Payer: Medicare Other | Admitting: Cardiology

## 2020-03-18 VITALS — BP 135/81 | HR 77 | Ht 69.0 in | Wt 202.8 lb

## 2020-03-18 DIAGNOSIS — I428 Other cardiomyopathies: Secondary | ICD-10-CM | POA: Diagnosis not present

## 2020-03-18 DIAGNOSIS — I5032 Chronic diastolic (congestive) heart failure: Secondary | ICD-10-CM

## 2020-03-18 DIAGNOSIS — Z950 Presence of cardiac pacemaker: Secondary | ICD-10-CM

## 2020-03-18 DIAGNOSIS — I10 Essential (primary) hypertension: Secondary | ICD-10-CM

## 2020-03-18 DIAGNOSIS — I4811 Longstanding persistent atrial fibrillation: Secondary | ICD-10-CM

## 2020-03-18 DIAGNOSIS — I272 Pulmonary hypertension, unspecified: Secondary | ICD-10-CM | POA: Diagnosis not present

## 2020-03-18 NOTE — Progress Notes (Signed)
Virtual Visit via Telephone Note   This visit type was conducted due to national recommendations for restrictions regarding the COVID-19 Pandemic (e.g. social distancing) in an effort to limit this patient's exposure and mitigate transmission in our community.  Due to his co-morbid illnesses, this patient is at least at moderate risk for complications without adequate follow up.  This format is felt to be most appropriate for this patient at this time.  The patient did not have access to video technology/had technical difficulties with video requiring transitioning to audio format only (telephone).  All issues noted in this document were discussed and addressed.  No physical exam could be performed with this format.  Please refer to the patient's chart for his  consent to telehealth for [facility].   The patient was identified using 2 identifiers.  Date:  03/18/2020   ID:  Bradley Searles Sr., DOB 1941-09-29, MRN [id number]  Patient Location: Home Provider Location: Home  PCP:  Bradley Plump, MD  Cardiologist:  [name], MD  Electrophysiologist:  None   Evaluation Performed:  Follow-Up Visit  Chief Complaint:  none  History of Present Illness:    Bradley DESANTIS Sr. is a 79 y.o. male with a hx of nonischemic cardiomyopathy.  He had a CRT device placed in 2012 and is a responder.  His last echocardiogram in December 2019 showed his ejection fraction to be 55 to 60%.  He does have pulmonary hypertension with a PA pressure of 49 and severe TR.  He has significant COPD, he has been followed by [name] in the past.  He is on chronic home O2.  He has a history of PAF but is not on anticoagulation secondary to a history of intercerebral bleed in 2013.  He had no coronary disease at catheterization in 2005 and 2018.  Patient was admitted to the hospital Dec 2020 with acute respiratory failure secondary to COPD exacerbation requiring antibiotics and steroids.  Since then he says he has done  well, no issues with his COPD or increased SOB.  He has had no issues with his medications.   The patient does not have symptoms concerning for COVID-19 infection (fever, chills, cough, or new shortness of breath).  He has received his COVID vaccine   Past Medical History:  Diagnosis Date  . Abnormal CT scan, chest 2012   CT chest, several lymphadenopathies. Sees pulmonary  . Anemia    intermittent  . Arthritis    "?back" (09/19/2018)  . Atrial fibrillation (HCC)    s/p AV node ablation & BiV PPM implantation 09/08/11 (op dictation pending)  . BPH (benign prostatic hyperplasia)    Saw Dr Bradley [name] 2004, normal renal u/s  . Bronchitis 08/26/2017  . CAD (coronary artery disease)   . CHF (congestive heart failure) (HCC)    Thought primarily to be non-systolic although EF down (EF 59-74% 12/2010, down to 35-40% 09/05/11), cath 2008 with no CAD, nuclear study 07/2011 showing Small area of reversibility in the distal ant/lat wall the left ventricle suspicious for ischemia/septal wall HK but felt to be low risk  (per D/C Summary 07/2011)  . Chronic bronchitis (HCC)    "get it ~ q yr"  . Heart murmur   . HLD (hyperlipidemia)   . HTN (hypertension)   . 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 Insomnia   . Migraines    "very very rare"  . On home oxygen therapy    "  2L; 24/7" (09/19/2018)  . Pacemaker   . Peripheral vascular disease (HCC)    ??  . Pleural effusion 2008   S/p decortication  . Pulmonary HTN (HCC)    per cath 2008  . Type II diabetes mellitus (HCC) 1999   Past Surgical History:  Procedure Laterality Date  . BI-VENTRICULAR PACEMAKER INSERTION Left 09/08/2011   Procedure: BI-VENTRICULAR PACEMAKER INSERTION (CRT-P);  Surgeon: Marinus Maw, MD;  Location: [facility] CATH LAB;  Service: Cardiovascular;  Laterality: Left;  . CARDIAC CATHETERIZATION     "couple times; never had balloon or stent" (09/19/2018)  . CATARACT EXTRACTION W/ INTRAOCULAR LENS   IMPLANT, BILATERAL Bilateral 08/2018  . COLONOSCOPY  03/10/11   normal  . INSERT / REPLACE / REMOVE PACEMAKER  09/08/11   pacemaker placement  . LUNG DECORTICATION    . PLEURAL SCARIFICATION    . pneumothorax with fibrothorax  ~ 2010  . TONSILLECTOMY     "as a kid"      Current Meds  Medication Sig  . acetaminophen (TYLENOL) 500 MG tablet Take 500 mg by mouth every 6 (six) hours as needed for headache.  Marland Kitchen amLODipine (NORVASC) 10 MG tablet TAKE 1 TABLET DAILY  . aspirin 81 MG tablet Take 81 mg by mouth every morning.   Marland Kitchen atorvastatin (LIPITOR) 20 MG tablet TAKE 1 TABLET AT BEDTIME  . budesonide (PULMICORT) 0.5 MG/2ML nebulizer solution Take 2 mLs (0.5 mg total) by nebulization 2 (two) times daily.  . calcium-vitamin D (OSCAL WITH D) 500-200 MG-UNIT tablet Take 1 tablet by mouth every morning.  . carvedilol (COREG) 25 MG tablet Take 1 tablet (25 mg total) by mouth 2 (two) times daily with a meal.  . diphenhydramine-acetaminophen (TYLENOL PM EXTRA STRENGTH) 25-500 MG TABS tablet Take 1 tablet by mouth at bedtime.  . furosemide (LASIX) 40 MG tablet Take 2 tablets daily. On Monday, Wednesday and Friday- take 2 tablets in the morning and 1 in the afternoon  . ipratropium-albuterol (DUONEB) 0.5-2.5 (3) MG/3ML SOLN Take 3 mLs by nebulization 2 (two) times daily as needed.  . Lancets (ONETOUCH ULTRASOFT) lancets Check blood sugars no more than twice daily  . losartan (COZAAR) 100 MG tablet Take 0.5 tablets (50 mg total) by mouth at bedtime.  . Multiple Vitamins-Minerals (MULTIVITAMINS THER. W/MINERALS) TABS tablet Take 1 tablet by mouth daily.  . nitroGLYCERIN (NITROSTAT) 0.4 MG SL tablet Place 1 tablet (0.4 mg total) under the tongue every 5 (five) minutes x 3 doses as needed for chest pain.  Letta Pate VERIO test strip USE TO CHECK BLOOD SUGAR NO MORE THAN TWICE A DAY  . pantoprazole (PROTONIX) 40 MG tablet Take 1 tablet (40 mg total) by mouth every morning.  . sitaGLIPtin (JANUVIA) 100 MG  tablet Take 1 tablet (100 mg total) by mouth daily.  . tamsulosin (FLOMAX) 0.4 MG CAPS capsule Take 1 capsule (0.4 mg total) by mouth daily after supper.     Allergies:   Hydrocodone, Tramadol, Tadalafil, and Avelox [moxifloxacin hydrochloride]   Social History   Tobacco Use  . Smoking status: Former Smoker    Packs/day: 2.50    Years: 25.00    Pack years: 62.50    Types: Cigarettes, Cigars    Quit date: 10/11/1980    Years since quitting: 39.4  . Smokeless tobacco: Never Used  Substance Use Topics  . Alcohol use: Not Currently    Comment: 1 can of beer ever 4-5 months  . Drug use: No  Family Hx: The patient's family history includes Breast cancer in his maternal aunt; Coronary artery disease in his father; Diabetes in his father; Drug abuse in his son; Polycythemia in his mother. There is no history of Prostate cancer or Colon cancer.  ROS:   Please see the history of present illness.     All other systems reviewed and are negative.   Prior CV studies:   The following studies were reviewed today:  Echo 09/23/2018- Study Conclusions   - Left ventricle: The cavity size was normal. Systolic function was  normal. The estimated ejection fraction was in the range of 55%  to 60%. Wall motion was normal; there were no regional wall  motion abnormalities. Doppler parameters are consistent with high  ventricular filling pressure.  - Aortic valve: Sclerosis without stenosis. There was no  regurgitation. Mean gradient (S): 6 mm Hg.  - Mitral valve: Calcified annulus. Transvalvular velocity was  within the normal range. There was no evidence for stenosis.  There was mild regurgitation.  - Left atrium: The atrium was severely dilated.  - Right ventricle: The cavity size was mildly dilated. Wall  thickness was normal. Systolic function was mildly reduced. RV  systolic pressure (S, est): 49 mm Hg.  - Right atrium: The atrium was severely dilated.  - Tricuspid  valve: There was severe regurgitation.  - Pulmonic valve: There was mild regurgitation.  - Pulmonary arteries: Systolic pressure was moderately to severely  increased. PA peak pressure: 49 mm Hg (S).  - Inferior vena cava: The vessel was dilated. The respirophasic  diameter changes were in the normal range (>= 50%).  - Pericardium, extracardiac: There was no pericardial effusion.    Labs/Other Tests and Data Reviewed:    EKG:  An ECG dated 10/10/2019 was personally reviewed today and demonstrated:  V-paced  Recent Labs: 05/09/2019: Magnesium 2.1 09/13/2019: B Natriuretic Peptide 165.6 09/25/2019: ALT 24; Hemoglobin 14.1; Platelets 189.0 12/17/2019: BUN 9; Creatinine, Ser 0.78; Potassium 4.5; Sodium 141   Recent Lipid Panel Lab Results  Component Value Date/Time   CHOL 144 11/26/2019 10:29 AM   TRIG 120.0 11/26/2019 10:29 AM   HDL 36.50 (L) 11/26/2019 10:29 AM   CHOLHDL 4 11/26/2019 10:29 AM   LDLCALC 84 11/26/2019 10:29 AM    Wt Readings from Last 3 Encounters:  03/18/20 202 lb 12.8 oz (92 kg)  01/16/20 204 lb 12.8 oz (92.9 kg)  11/26/19 205 lb 6 oz (93.2 kg)     Objective:    Vital Signs:  BP 135/81   Pulse 77   Ht 5\' 9"  (1.753 m)   Wt 202 lb 12.8 oz (92 kg)   SpO2 90%   BMI 29.95 kg/m    VITAL SIGNS:  reviewed  ASSESSMENT & PLAN:    Chronic diastolic (congestive) heart failure (HCC) Doing well on his current Lasix dose- weight today 202 lbs  Chronic pulmonary hypertension secondary to elevated L H pressures  Evaluated by Dr  Echo Dec 2019- PA 49 mmHg with severe TR  Non-ischemic cardiomyopathy (HCC) CRT responder- EF 55-60% Dec 2019  Permanent atrial fibrillation H/O AVN ablation No Coumadin due to an intracranial bleed 2013  Pacemaker-Medtronic MDT CRT-P 2012- pacer check done April 2021-Dr Taylor  Chronic respiratory failure with hypoxia (HCC) Home O2-4L  Essential hypertension Controlled  COVID-19 Education: The signs and  symptoms of COVID-19 were discussed with the patient and how to seek care for testing (follow up with PCP or arrange E-visit).  The importance  of social distancing was discussed today.  Time:   Today, I have spent 10 minutes with the patient with telehealth technology discussing the above problems.     Medication Adjustments/Labs and Tests Ordered: Current medicines are reviewed at length with the patient today.  Concerns regarding medicines are outlined above.   Tests Ordered: No orders of the defined types were placed in this encounter.   Medication Changes: No orders of the defined types were placed in this encounter.   Follow Up:  In Person Dr Oval Linsey in 6 months  Signed, [name], PA-C  03/18/2020 11:01 AM    [place]

## 2020-03-18 NOTE — Patient Instructions (Signed)
Medication Instructions:  Your physician recommends that you continue on your current medications as directed. Please refer to the Current Medication list given to you today.  *If you need a refill on your cardiac medications before your next appointment, please call your pharmacy*   Follow-Up: At [facility], you and your health needs are our priority.  As part of our continuing mission to provide you with exceptional heart care, we have created designated Provider Care Teams.  These Care Teams include your primary Cardiologist (physician) and Advanced Practice Providers (APPs -  Physician Assistants and Nurse Practitioners) who all work together to provide you with the care you need, when you need it.  We recommend signing up for the patient portal called "MyChart".  Sign up information is provided on this After Visit Summary.  MyChart is used to connect with patients for Virtual Visits (Telemedicine).  Patients are able to view lab/test results, encounter notes, upcoming appointments, etc.  Non-urgent messages can be sent to your provider as well.   To learn more about what you can do with MyChart, go to [email or web address].    Your next appointment:   6 month(s)  The format for your next appointment:   In Person  Provider:   You may see Tiffany Liberty, MD or one of the following Advanced Practice Providers on your designated Care Team:    Luke Kilroy, PA-C  Callie Goodrich, PA-C  Jesse Cleaver, FNP    Other Instructions Please call our office 2 months in advance to schedule your follow-up appointment with Dr. [place].  

## 2020-03-20 NOTE — Progress Notes (Signed)
Subjective:   [name] is a 79 y.o. male who presents for Medicare Annual/Subsequent preventive examination.  Review of Systems:  Home Safety/Smoke Alarms: Feels safe in home. Smoke alarms in place.  Lives w/ wife, granddaughter and great-granddaughter. 3 story home. Chair lift in place.   Male:   CCS- 2012 normal  PSA-  Lab Results  Component Value Date   PSA 3.60 07/15/2015   PSA 3.50 01/14/2014   PSA 1.79 12/14/2010       Objective:    Vitals: BP 118/68 (BP Location: Left Arm, Patient Position: Sitting, Cuff Size: Normal)    Pulse 77    Temp (!) 97 F (36.1 C) (Temporal)    Ht 5\' 9"  (1.753 m)    Wt 204 lb 6.4 oz (92.7 kg)    SpO2 (!) 87%    BMI 30.18 kg/m   Body mass index is 30.18 kg/m.   Wt Readings from Last 3 Encounters:  03/21/20 204 lb 6.4 oz (92.7 kg)  03/18/20 202 lb 12.8 oz (92 kg)  01/16/20 204 lb 12.8 oz (92.9 kg)   Temp Readings from Last 3 Encounters:  03/21/20 (!) 97 F (36.1 C) (Temporal)  11/26/19 (!) 96.2 F (35.7 C) (Temporal)  09/25/19 (!) 96.5 F (35.8 C) (Temporal)   BP Readings from Last 3 Encounters:  03/21/20 118/68  03/18/20 135/81  01/16/20 134/80   Pulse Readings from Last 3 Encounters:  03/21/20 77  03/18/20 77  01/16/20 94      Advanced Directives 03/21/2020 09/14/2019 05/07/2019 05/07/2019 12/22/2018 12/21/2018 09/19/2018  Does Patient Have a Medical Advance Directive? Yes Yes Yes Yes Yes Yes Yes  Type of 14/07/2018 of [place];Living will Living will Living will - Healthcare Power of [place];Living will Healthcare Power of [place];Living will Living will  Does patient want to make changes to medical advance directive? No - Patient declined No - Patient declined No - Patient declined - No - Patient declined - No - Patient declined  Copy of Healthcare Power of Attorney in Chart? No - copy requested - - - - - -  Would patient like information on creating a medical advance directive? - - - - - - -    Pre-existing out of facility DNR order (yellow form or pink MOST form) - - - - - - -    Tobacco Social History   Tobacco Use  Smoking Status Former Smoker   Packs/day: 2.50   Years: 25.00   Pack years: 62.50   Types: Cigarettes, Cigars   Quit date: 10/11/1980   Years since quitting: 39.4  Smokeless Tobacco Never Used     Counseling given: Not Answered   Clinical Intake:     Pain : No/denies pain                 Past Medical History:  Diagnosis Date   Abnormal CT scan, chest 2012   CT chest, several lymphadenopathies. Sees pulmonary   Anemia    intermittent   Arthritis    "?back" (09/19/2018)   Atrial fibrillation (HCC)    s/p AV node ablation & BiV PPM implantation 09/08/11 (op dictation pending)   BPH (benign prostatic hyperplasia)    Saw Dr 09/10/11 2004, normal renal u/s   Bronchitis 08/26/2017   CAD (coronary artery disease)    CHF (congestive heart failure) (HCC)    Thought primarily to be non-systolic although EF down (EF 08/28/2017 12/2010, down to 35-40% 09/05/11), cath 2008 with  no CAD, nuclear study 07/2011 showing Small area of reversibility in the distal ant/lat wall the left ventricle suspicious for ischemia/septal wall HK but felt to be low risk  (per D/C Summary 07/2011)   Chronic bronchitis (HCC)    "get it ~ q yr"   Heart murmur    HLD (hyperlipidemia)    HTN (hypertension)    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   Pleural effusion 2008   S/p decortication   Pulmonary HTN (HCC)    per cath 2008   Type II diabetes mellitus (HCC) 1999   Past Surgical History:  Procedure Laterality Date   BI-VENTRICULAR PACEMAKER INSERTION Left 09/08/2011   Procedure: BI-VENTRICULAR PACEMAKER INSERTION (CRT-P);  Surgeon: Marinus Maw, MD;   Location: [facility] CATH LAB;  Service: Cardiovascular;  Laterality: Left;   CARDIAC CATHETERIZATION     "couple times; never had balloon or stent" (09/19/2018)   CATARACT EXTRACTION W/ INTRAOCULAR LENS  IMPLANT, BILATERAL Bilateral 08/2018   COLONOSCOPY  03/10/11   normal   INSERT / REPLACE / REMOVE PACEMAKER  09/08/11   pacemaker placement   LUNG DECORTICATION     PLEURAL SCARIFICATION     pneumothorax with fibrothorax  ~ 2010   TONSILLECTOMY     "as a kid"    Family History  Problem Relation Age of Onset   Diabetes Father    Coronary artery disease Father    Polycythemia Mother    Drug abuse Son    Breast cancer Maternal Aunt    Prostate cancer Neg Hx    Colon cancer Neg Hx    Social History   Socioeconomic History   Marital status: Married    Spouse name: [place]   Number of children: 1   Years of education: Not on file   Highest education level: Not on file  Occupational History   Occupation: retired Publishing rights manager: RETIRED  Tobacco Use   Smoking status: Former Smoker    Packs/day: 2.50    Years: 25.00    Pack years: 62.50    Types: Cigarettes, Cigars    Quit date: 10/11/1980    Years since quitting: 39.4   Smokeless tobacco: Never Used  Vaping Use   Vaping Use: Never used  Substance and Sexual Activity   Alcohol use: Not Currently    Comment: 1 can of beer ever 4-5 months   Drug use: No   Sexual activity: Not Currently  Other Topics Concern   Not on file  Social History Narrative   Moved from [place] 2003.[place]   He lives w/ his wife, [name]   1 son, substance abuse, passed away October 08, 2016         Social Determinants of Health   Financial Resource Strain: Low Risk    Difficulty of Paying Living Expenses: Not hard at all  Food Insecurity: No Food Insecurity   Worried About Programme researcher, broadcasting/film/video in the Last Year: Never true   Barista in the Last Year: Never true  Transportation Needs: No Transportation Needs    Lack of Transportation (Medical): No   Lack of Transportation (Non-Medical): No  Physical Activity:    Days of Exercise per Week:    Minutes  of Exercise per Session:   Stress:    Feeling of Stress :   Social Connections:    Frequency of Communication with Friends and Family:    Frequency of Social Gatherings with Friends and Family:    Attends Religious Services:    Active Member of Clubs or Organizations:    Attends Archivist Meetings:    Marital Status:     Outpatient Encounter Medications as of 03/21/2020  Medication Sig   acetaminophen (TYLENOL) 500 MG tablet Take 500 mg by mouth every 6 (six) hours as needed for headache.   amLODipine (NORVASC) 10 MG tablet TAKE 1 TABLET DAILY   aspirin 81 MG tablet Take 81 mg by mouth every morning.    atorvastatin (LIPITOR) 20 MG tablet TAKE 1 TABLET AT BEDTIME   budesonide (PULMICORT) 0.5 MG/2ML nebulizer solution Take 2 mLs (0.5 mg total) by nebulization 2 (two) times daily.   calcium-vitamin D (OSCAL WITH D) 500-200 MG-UNIT tablet Take 1 tablet by mouth every morning.   carvedilol (COREG) 25 MG tablet Take 1 tablet (25 mg total) by mouth 2 (two) times daily with a meal.   diphenhydramine-acetaminophen (TYLENOL PM EXTRA STRENGTH) 25-500 MG TABS tablet Take 1 tablet by mouth at bedtime.   furosemide (LASIX) 40 MG tablet Take 2 tablets daily. On Monday, Wednesday and Friday- take 2 tablets in the morning and 1 in the afternoon   ipratropium-albuterol (DUONEB) 0.5-2.5 (3) MG/3ML SOLN Take 3 mLs by nebulization 2 (two) times daily as needed.   Lancets (ONETOUCH ULTRASOFT) lancets Check blood sugars no more than twice daily   losartan (COZAAR) 100 MG tablet Take 0.5 tablets (50 mg total) by mouth at bedtime.   Multiple Vitamins-Minerals (MULTIVITAMINS THER. W/MINERALS) TABS tablet Take 1 tablet by mouth daily.   ONETOUCH VERIO test strip USE TO CHECK BLOOD SUGAR NO MORE THAN TWICE A DAY   pantoprazole (PROTONIX)  40 MG tablet Take 1 tablet (40 mg total) by mouth every morning.   sitaGLIPtin (JANUVIA) 100 MG tablet Take 1 tablet (100 mg total) by mouth daily.   tamsulosin (FLOMAX) 0.4 MG CAPS capsule Take 1 capsule (0.4 mg total) by mouth daily after supper.   nitroGLYCERIN (NITROSTAT) 0.4 MG SL tablet Place 1 tablet (0.4 mg total) under the tongue every 5 (five) minutes x 3 doses as needed for chest pain. (Patient not taking: Reported on 03/21/2020)   No facility-administered encounter medications on file as of 03/21/2020.    Activities of Daily Living In your present state of health, do you have any difficulty performing the following activities: 03/21/2020 09/14/2019  Hearing? N Y  Vision? N N  Difficulty concentrating or making decisions? N N  Walking or climbing stairs? N Y  Dressing or bathing? N N  Doing errands, shopping? N N  Preparing Food and eating ? N -  Using the Toilet? N -  In the past six months, have you accidently leaked urine? N -  Do you have problems with loss of bowel control? N -  Managing your Medications? N -  Managing your Finances? N -  Housekeeping or managing your Housekeeping? N -  Some recent data might be hidden    Patient Care Team: Colon Branch, MD as PCP - General (Internal Medicine) Skeet Latch, MD as PCP - Cardiology (Cardiology) Colon Branch, MD as Referring Physician (Internal Medicine) Evans Lance, MD as Consulting Physician (Cardiology) Tanda Rockers, MD as Consulting Physician (Pulmonary Disease) Ruby Cola, MD  as Therapist, music (Otolaryngology) Poudyal, Ritesh, OD (Optometry)   Assessment:   This is a routine wellness examination for Brogen. Physical assessment deferred to PCP.  Exercise Activities and Dietary recommendations Current Exercise Habits: Home exercise routine, Type of exercise: walking, Time (Minutes): 10, Frequency (Times/Week): 5, Weekly Exercise (Minutes/Week): 50, Intensity: Mild, Exercise limited by: None  identified Diet (meal preparation, eat out, water intake, caffeinated beverages, dairy products, fruits and vegetables): low fat/ cholesterol, low salt    Goals      Maintain current health (pt-stated)       Fall Risk Fall Risk  03/21/2020 11/26/2019 09/11/2018 09/07/2017 08/26/2016  Falls in the past year? 0 0 0 No Yes  Number falls in past yr: 0 0 - - 1  Injury with Fall? 0 0 - - Yes  Follow up Education provided;Falls prevention discussed Falls evaluation completed - - Falls evaluation completed   Depression Screen PHQ 2/9 Scores 03/21/2020 11/26/2019 09/11/2018 05/25/2018  PHQ - 2 Score 0 0 0 0    Cognitive Function Ad8 score reviewed for issues:  Issues making decisions:no  Less interest in hobbies / activities:no  Repeats questions, stories (family complaining):no  Trouble using ordinary gadgets (microwave, computer, phone):no  Forgets the month or year: no  Mismanaging finances:no   Remembering appts:no  Daily problems with thinking and/or memory:no Ad8 score is=0  MMSE - Mini Mental State Exam 09/07/2017  Not completed: Refused        Immunization History  Administered Date(s) Administered   Fluad Quad(high Dose 65+) 06/26/2019   Influenza Split 10/01/2011   Influenza Whole 08/14/2007, 07/11/2009, 09/14/2010   Influenza, High Dose Seasonal PF 09/14/2013, 07/15/2015, 07/09/2016, 07/05/2017, 08/03/2018   Influenza,inj,Quad PF,6+ Mos 07/05/2014   Influenza-Unspecified 08/11/2012   Pneumococcal Conjugate-13 03/12/2015   Pneumococcal Polysaccharide-23 07/11/2004, 03/30/2009, 11/01/2017, 05/11/2019   Td 03/12/2003   Tetanus 09/14/2013   Zoster 04/20/2011    Screening Tests Health Maintenance  Topic Date Due   Hepatitis C Screening  Never done   COVID-19 Vaccine (1) Never done   FOOT EXAM  11/01/2018   OPHTHALMOLOGY EXAM  12/11/2019   INFLUENZA VACCINE  05/11/2020   HEMOGLOBIN A1C  05/25/2020   TETANUS/TDAP  09/15/2023   PNA vac  Low Risk Adult  Completed     Plan:   Great to see you!  Keep up the work!  I have personally reviewed and noted the following in the patients chart:    Medical and social history  Use of alcohol, tobacco or illicit drugs   Current medications and supplements  Functional ability and status  Nutritional status  Physical activity  Advanced directives  List of other physicians  Hospitalizations, surgeries, and ER visits in previous 12 months  Vitals  Screenings to include cognitive, depression, and falls  Referrals and appointments  In addition, I have reviewed and discussed with patient certain preventive protocols, quality metrics, and best practice recommendations. A written personalized care plan for preventive services as well as general preventive health recommendations were provided to patient.     Avon Gully, [place]  03/21/2020

## 2020-03-21 ENCOUNTER — Other Ambulatory Visit: Payer: Self-pay

## 2020-03-21 ENCOUNTER — Encounter: Payer: Self-pay | Admitting: *Deleted

## 2020-03-21 ENCOUNTER — Ambulatory Visit (INDEPENDENT_AMBULATORY_CARE_PROVIDER_SITE_OTHER): Payer: Medicare Other | Admitting: *Deleted

## 2020-03-21 VITALS — BP 118/68 | HR 77 | Temp 97.0°F | Ht 69.0 in | Wt 204.4 lb

## 2020-03-21 DIAGNOSIS — Z0000 Encounter for general adult medical examination without abnormal findings: Secondary | ICD-10-CM | POA: Diagnosis not present

## 2020-03-21 NOTE — Patient Instructions (Addendum)
Great to see you!  Keep up the work!   Bradley [name] , Thank you for taking time to come for your Medicare Wellness Visit. I appreciate your ongoing commitment to your health goals. Please review the following plan we discussed and let me know if I can assist you in the future.   These are the goals we discussed: Goals    .  Maintain current health (pt-stated)       This is a list of the screening recommended for you and due dates:  Health Maintenance  Topic Date Due  .  Hepatitis C: One time screening is recommended by Center for Disease Control  (CDC) for  adults born from [birth year] through 1965.   Never done  . COVID-19 Vaccine (1) Never done  . Complete foot exam   11/01/2018  . Eye exam for diabetics  12/11/2019  . Flu Shot  05/11/2020  . Hemoglobin A1C  05/25/2020  . Tetanus Vaccine  09/15/2023  . Pneumonia vaccines  Completed    Preventive Care 26 Years and Older, Male Preventive care refers to lifestyle choices and visits with your health care provider that can promote health and wellness. This includes:  A yearly physical exam. This is also called an annual well check.  Regular dental and eye exams.  Immunizations.  Screening for certain conditions.  Healthy lifestyle choices, such as diet and exercise. What can I expect for my preventive care visit? Physical exam Your health care provider will check:  Height and weight. These may be used to calculate body mass index (BMI), which is a measurement that tells if you are at a healthy weight.  Heart rate and blood pressure.  Your skin for abnormal spots. Counseling Your health care provider may ask you questions about:  Alcohol, tobacco, and drug use.  Emotional well-being.  Home and relationship well-being.  Sexual activity.  Eating habits.  History of falls.  Memory and ability to understand (cognition).  Work and work Statistician. What immunizations do I need?  Influenza (flu) vaccine  This is  recommended every year. Tetanus, diphtheria, and pertussis (Tdap) vaccine  You may need a Td booster every 10 years. Varicella (chickenpox) vaccine  You may need this vaccine if you have not already been vaccinated. Zoster (shingles) vaccine  You may need this after age 13. Pneumococcal conjugate (PCV13) vaccine  One dose is recommended after age 72. Pneumococcal polysaccharide (PPSV23) vaccine  One dose is recommended after age 8. Measles, mumps, and rubella (MMR) vaccine  You may need at least one dose of MMR if you were born in 1957 or later. You may also need a second dose. Meningococcal conjugate (MenACWY) vaccine  You may need this if you have certain conditions. Hepatitis A vaccine  You may need this if you have certain conditions or if you travel or work in places where you may be exposed to hepatitis A. Hepatitis B vaccine  You may need this if you have certain conditions or if you travel or work in places where you may be exposed to hepatitis B. Haemophilus influenzae type b (Hib) vaccine  You may need this if you have certain conditions. You may receive vaccines as individual doses or as more than one vaccine together in one shot (combination vaccines). Talk with your health care provider about the risks and benefits of combination vaccines. What tests do I need? Blood tests  Lipid and cholesterol levels. These may be checked every 5 years, or more frequently depending  on your overall health.  Hepatitis C test.  Hepatitis B test. Screening  Lung cancer screening. You may have this screening every year starting at age 55 if you have a 30-pack-year history of smoking and currently smoke or have quit within the past 15 years.  Colorectal cancer screening. All adults should have this screening starting at age 50 and continuing until age 75. Your health care provider may recommend screening at age 45 if you are at increased risk. You will have tests every 1-10  years, depending on your results and the type of screening test.  Prostate cancer screening. Recommendations will vary depending on your family history and other risks.  Diabetes screening. This is done by checking your blood sugar (glucose) after you have not eaten for a while (fasting). You may have this done every 1-3 years.  Abdominal aortic aneurysm (AAA) screening. You may need this if you are a current or former smoker.  Sexually transmitted disease (STD) testing. Follow these instructions at home: Eating and drinking  Eat a diet that includes fresh fruits and vegetables, whole grains, lean protein, and low-fat dairy products. Limit your intake of foods with high amounts of sugar, saturated fats, and salt.  Take vitamin and mineral supplements as recommended by your health care provider.  Do not drink alcohol if your health care provider tells you not to drink.  If you drink alcohol: ? Limit how much you have to 0-2 drinks a day. ? Be aware of how much alcohol is in your drink. In the [place] one drink equals one 12 oz bottle of beer (355 mL), one 5 oz glass of wine (148 mL), or one 1 oz glass of hard liquor (44 mL). Lifestyle  Take daily care of your teeth and gums.  Stay active. Exercise for at least 30 minutes on 5 or more days each week.  Do not use any products that contain nicotine or tobacco, such as cigarettes, e-cigarettes, and chewing tobacco. If you need help quitting, ask your health care provider.  If you are sexually active, practice safe sex. Use a condom or other form of protection to prevent STIs (sexually transmitted infections).  Talk with your health care provider about taking a low-dose aspirin or statin. What's next?  Visit your health care provider once a year for a well check visit.  Ask your health care provider how often you should have your eyes and teeth checked.  Stay up to date on all vaccines. This information is not intended to replace  advice given to you by your health care provider. Make sure you discuss any questions you have with your health care provider. Document Revised: 09/21/2018 Document Reviewed: 09/21/2018 Elsevier Patient Education  2020 [employer] Inc[employer]  

## 2020-03-24 ENCOUNTER — Other Ambulatory Visit: Payer: Self-pay | Admitting: Internal Medicine

## 2020-03-25 ENCOUNTER — Other Ambulatory Visit: Payer: Self-pay

## 2020-03-25 ENCOUNTER — Ambulatory Visit (INDEPENDENT_AMBULATORY_CARE_PROVIDER_SITE_OTHER): Payer: Medicare Other | Admitting: Internal Medicine

## 2020-03-25 ENCOUNTER — Encounter: Payer: Self-pay | Admitting: Internal Medicine

## 2020-03-25 VITALS — BP 106/65 | HR 93 | Temp 96.9°F | Resp 20 | Ht 69.0 in | Wt 208.1 lb

## 2020-03-25 DIAGNOSIS — I10 Essential (primary) hypertension: Secondary | ICD-10-CM | POA: Diagnosis not present

## 2020-03-25 DIAGNOSIS — I5032 Chronic diastolic (congestive) heart failure: Secondary | ICD-10-CM | POA: Diagnosis not present

## 2020-03-25 DIAGNOSIS — J9611 Chronic respiratory failure with hypoxia: Secondary | ICD-10-CM | POA: Diagnosis not present

## 2020-03-25 DIAGNOSIS — E118 Type 2 diabetes mellitus with unspecified complications: Secondary | ICD-10-CM

## 2020-03-25 LAB — CBC WITH DIFFERENTIAL/PLATELET
Basophils Absolute: 0 10*3/uL (ref 0.0–0.1)
Basophils Relative: 0.6 % (ref 0.0–3.0)
Eosinophils Absolute: 0.1 10*3/uL (ref 0.0–0.7)
Eosinophils Relative: 1 % (ref 0.0–5.0)
HCT: 41.2 % (ref 39.0–52.0)
Hemoglobin: 13.9 g/dL (ref 13.0–17.0)
Lymphocytes Relative: 27.9 % (ref 12.0–46.0)
Lymphs Abs: 1.6 10*3/uL (ref 0.7–4.0)
MCHC: 33.8 g/dL (ref 30.0–36.0)
MCV: 91.8 fl (ref 78.0–100.0)
Monocytes Absolute: 0.5 10*3/uL (ref 0.1–1.0)
Monocytes Relative: 8.3 % (ref 3.0–12.0)
Neutro Abs: 3.5 10*3/uL (ref 1.4–7.7)
Neutrophils Relative %: 62.2 % (ref 43.0–77.0)
Platelets: 169 10*3/uL (ref 150.0–400.0)
RBC: 4.49 Mil/uL (ref 4.22–5.81)
RDW: 14.7 % (ref 11.5–15.5)
WBC: 5.6 10*3/uL (ref 4.0–10.5)

## 2020-03-25 LAB — COMPREHENSIVE METABOLIC PANEL
ALT: 9 U/L (ref 0–53)
AST: 14 U/L (ref 0–37)
Albumin: 3.8 g/dL (ref 3.5–5.2)
Alkaline Phosphatase: 68 U/L (ref 39–117)
BUN: 12 mg/dL (ref 6–23)
CO2: 34 mEq/L — ABNORMAL HIGH (ref 19–32)
Calcium: 9.1 mg/dL (ref 8.4–10.5)
Chloride: 100 mEq/L (ref 96–112)
Creatinine, Ser: 0.79 mg/dL (ref 0.40–1.50)
GFR: 94.59 mL/min (ref >60.00)
Glucose, Bld: 129 mg/dL — ABNORMAL HIGH (ref 70–99)
Potassium: 4.8 mEq/L (ref 3.5–5.1)
Sodium: 135 mEq/L (ref 135–145)
Total Bilirubin: 0.9 mg/dL (ref 0.2–1.2)
Total Protein: 6.6 g/dL (ref 6.0–8.3)

## 2020-03-25 LAB — HEMOGLOBIN A1C: Hgb A1c MFr Bld: 6.9 % — ABNORMAL HIGH (ref 4.6–6.5)

## 2020-03-25 NOTE — Patient Instructions (Addendum)
Per our records you are due for an eye exam. Please contact your eye doctor to schedule an appointment. Please have them send copies of your office visit notes to [place]. Our fax number is [phone].  Continue checking your blood pressures BP GOAL is between 110/65 and  135/85. If it is consistently higher or lower, let me know   GO TO THE LAB : Get the blood work     GO TO THE FRONT DESK, PLEASE SCHEDULE YOUR APPOINTMENTS Come back for a checkup in 3 to 4 months  Please consider get a vaccination for shingles called [facility]

## 2020-03-25 NOTE — Progress Notes (Signed)
Subjective:    Patient ID: Bradley Sit., male    DOB: 1941/03/23, 79 y.o.   MRN: [id number]  DOS:  03/25/2020 Type of visit - description: Follow-up Since the last office visit, saw cardiology, note reviewed. In general reports that he is feeling very well.  BP Readings from Last 3 Encounters:  03/25/20 106/65  03/21/20 118/68  03/18/20 135/81    Wt Readings from Last 3 Encounters:  03/25/20 208 lb 2 oz (94.4 kg)  03/21/20 204 lb 6.4 oz (92.7 kg)  03/18/20 202 lb 12.8 oz (92 kg)    Review of Systems Notices O2 sat to be low today.  He denies chest pain, lower extremity edema cough or sputum production.  Shortness of breath is at baseline.   Past Medical History:  Diagnosis Date  . Abnormal CT scan, chest 2012   CT chest, several lymphadenopathies. Sees pulmonary  . Anemia    intermittent  . Arthritis    "?back" (09/19/2018)  . Atrial fibrillation (HCC)    s/p AV node ablation & BiV PPM implantation 09/08/11 (op dictation pending)  . BPH (benign prostatic hyperplasia)    Saw Dr [name] 2004, normal renal u/s  . Bronchitis 08/26/2017  . CAD (coronary artery disease)   . CHF (congestive heart failure) (HCC)    Thought primarily to be non-systolic although EF down (EF 56-38% 12/2010, down to 35-40% 09/05/11), cath 2008 with no CAD, nuclear study 07/2011 showing Small area of reversibility in the distal ant/lat wall the left ventricle suspicious for ischemia/septal wall HK but felt to be low risk  (per D/C Summary 07/2011)  . Chronic bronchitis (HCC)    "get it ~ q yr"  . Heart murmur   . HLD (hyperlipidemia)   . HTN (hypertension)   . 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 Insomnia   . Migraines    "very very rare"  . On home oxygen therapy    "2L; 24/7" (09/19/2018)  . Pacemaker   . Peripheral vascular disease (HCC)    ??  . Pleural effusion 2008   S/p decortication  . Pulmonary HTN (HCC)    per cath 2008  . Type II diabetes  mellitus (HCC) 1999    Past Surgical History:  Procedure Laterality Date  . BI-VENTRICULAR PACEMAKER INSERTION Left 09/08/2011   Procedure: BI-VENTRICULAR PACEMAKER INSERTION (CRT-P);  Surgeon: Marinus Maw, MD;  Location: [facility] CATH LAB;  Service: Cardiovascular;  Laterality: Left;  . CARDIAC CATHETERIZATION     "couple times; never had balloon or stent" (09/19/2018)  . CATARACT EXTRACTION W/ INTRAOCULAR LENS  IMPLANT, BILATERAL Bilateral 08/2018  . COLONOSCOPY  03/10/11   normal  . INSERT / REPLACE / REMOVE PACEMAKER  09/08/11   pacemaker placement  . LUNG DECORTICATION    . PLEURAL SCARIFICATION    . pneumothorax with fibrothorax  ~ 2010  . TONSILLECTOMY     "as a kid"     Allergies as of 03/25/2020      Reactions   Hydrocodone Other (See Comments)   "given to him in the hospital; went thru withdrawals once home; dr said not to take it again" (09/27/2012)   Tramadol Other (See Comments)   "given to him in the hospital; went thru withdrawals once home; dr said not to take it again" (09/27/2012)   Tadalafil Other (See Comments)   headache, backache   Avelox [moxifloxacin Hydrochloride] Itching, Other (See Comments)   Headache  Medication List       Accurate as of March 25, 2020 11:59 PM. If you have any questions, ask your nurse or doctor.        acetaminophen 500 MG tablet Commonly known as: TYLENOL Take 500 mg by mouth every 6 (six) hours as needed for headache.   amLODipine 10 MG tablet Commonly known as: NORVASC TAKE 1 TABLET DAILY   aspirin 81 MG tablet Take 81 mg by mouth every morning.   atorvastatin 20 MG tablet Commonly known as: LIPITOR TAKE 1 TABLET AT BEDTIME   budesonide 0.5 MG/2ML nebulizer solution Commonly known as: PULMICORT Take 2 mLs (0.5 mg total) by nebulization 2 (two) times daily.   calcium-vitamin D 500-200 MG-UNIT tablet Commonly known as: OSCAL WITH D Take 1 tablet by mouth every morning.   carvedilol 25 MG tablet Commonly  known as: COREG Take 1 tablet (25 mg total) by mouth 2 (two) times daily with a meal.   furosemide 40 MG tablet Commonly known as: LASIX Take 2 tablets daily. On Monday, Wednesday and Friday- take 2 tablets in the morning and 1 in the afternoon   ipratropium-albuterol 0.5-2.5 (3) MG/3ML Soln Commonly known as: DUONEB Take 3 mLs by nebulization 2 (two) times daily as needed.   losartan 100 MG tablet Commonly known as: COZAAR Take 0.5 tablets (50 mg total) by mouth daily.   multivitamins ther. w/minerals Tabs tablet Take 1 tablet by mouth daily.   nitroGLYCERIN 0.4 MG SL tablet Commonly known as: NITROSTAT Place 1 tablet (0.4 mg total) under the tongue every 5 (five) minutes x 3 doses as needed for chest pain.   onetouch ultrasoft lancets Check blood sugars no more than twice daily   OneTouch Verio test strip Generic drug: glucose blood USE TO CHECK BLOOD SUGAR NO MORE THAN TWICE A DAY   pantoprazole 40 MG tablet Commonly known as: PROTONIX Take 1 tablet (40 mg total) by mouth every morning.   sitaGLIPtin 100 MG tablet Commonly known as: Januvia Take 1 tablet (100 mg total) by mouth daily.   tamsulosin 0.4 MG Caps capsule Commonly known as: FLOMAX Take 1 capsule (0.4 mg total) by mouth daily after supper.   Tylenol PM Extra Strength 25-500 MG Tabs tablet Generic drug: diphenhydramine-acetaminophen Take 1 tablet by mouth at bedtime.          Objective:   Physical Exam BP 106/65 (BP Location: Left Arm, Patient Position: Sitting, Cuff Size: Normal)   Pulse 93   Temp (!) 96.9 F (36.1 C) (Temporal)   Resp 20   Ht 5\' 9"  (1.753 m)   Wt 208 lb 2 oz (94.4 kg)   SpO2 (!) 83% Comment: on 4L  BMI 30.73 kg/m  General:   Well developed, NAD, BMI noted. HEENT:  Normocephalic . Face symmetric, atraumatic Lungs:  Severely decreased breath sounds but no rhonchi or wheezing. Normal respiratory effort, no intercostal retractions, no accessory muscle use. Heart: RRR,  no  murmur.  Lower extremities: no pretibial edema bilaterally  Skin: Not pale. Not jaundice Neurologic:  alert & oriented X3.  Speech normal, gait appropriate for age and unassisted Psych--  Cognition and judgment appear intact.  Cooperative with normal attention span and concentration.  Behavior appropriate. No anxious or depressed appearing.      Assessment      Assessment  DM HTN Hyperlipidemia GERD Anxiety- insomnia  BPH Cardiovascular:Dr  --  Cath x 2 Ladona Ridgel: no significant CAD -- CHF, non-ischemic  --Atrial fibrillation;  h/o AV node ablation, has a Pacemaker --h/o IC bleeding: Not anticoagulated --Peripheral vascular disease? Pulmonary: Dr Melvyn Novas, last OV 10-18-2014, f/u prn --COPD, severe, chronic resp failure, night O2 prn, has portable O2  --Pleural effusion s/p decortication --Pulmonary hypertension  Stasis dermatitis   PLAN: DM: Currently on Januvia, checking A1c. HTN: BP slightly low today, at home is similar, reports he is doing well.  No change.  Continue amlodipine, carvedilol, losartan, Lasix.  Check a CBC CHF, atrial fibrillation: Since the last visit has seen cardiology, Last time 03/18/2020.  Noted to be stable.  Check a CMP COPD, chronic respiratory failure: On oxygen supplements, O2 sat today at rest 88%, he feels well, not tachycardic and in no distress. At home, O2 sat is mostly in the 90s and occasionally in the 80s.  Recommend to continue monitoring O2 sats, call if they are persistently in the 80s. Preventive care: Immunizations reviewed. RTC 3 to 4 months  This visit occurred during the SARS-CoV-2 public health emergency.  Safety protocols were in place, including screening questions prior to the visit, additional usage of staff PPE, and extensive cleaning of exam room while observing appropriate contact time as indicated for disinfecting solutions.

## 2020-03-25 NOTE — Progress Notes (Signed)
Pre visit review using our clinic review tool, if applicable. No additional management support is needed unless otherwise documented below in the visit note. 

## 2020-03-25 NOTE — Assessment & Plan Note (Signed)
Tdap 2014 PNM 23: Last 04-2019 PNM 13: 2016 Zostavax 2012 Shingrix: Recommended Status post J&J Covid vaccination 01-2020 See previous notes, no further screenings recommended.

## 2020-03-26 NOTE — Assessment & Plan Note (Signed)
DM: Currently on Januvia, checking A1c. HTN: BP slightly low today, at home is similar, reports he is doing well.  No change.  Continue amlodipine, carvedilol, losartan, Lasix.  Check a CBC CHF, atrial fibrillation: Since the last visit has seen cardiology, Last time 03/18/2020.  Noted to be stable.  Check a CMP COPD, chronic respiratory failure: On oxygen supplements, O2 sat today at rest 88%, he feels well, not tachycardic and in no distress. At home, O2 sat is mostly in the 90s and occasionally in the 80s.  Recommend to continue monitoring O2 sats, call if they are persistently in the 80s. Preventive care: Immunizations reviewed. RTC 3 to 4 months

## 2020-04-17 ENCOUNTER — Encounter: Payer: Self-pay | Admitting: Internal Medicine

## 2020-05-06 LAB — HM DIABETES EYE EXAM

## 2020-05-09 ENCOUNTER — Encounter: Payer: Self-pay | Admitting: Internal Medicine

## 2020-05-21 ENCOUNTER — Other Ambulatory Visit: Payer: Self-pay | Admitting: Internal Medicine

## 2020-05-27 ENCOUNTER — Ambulatory Visit (INDEPENDENT_AMBULATORY_CARE_PROVIDER_SITE_OTHER): Payer: Medicare Other | Admitting: *Deleted

## 2020-05-27 DIAGNOSIS — I428 Other cardiomyopathies: Secondary | ICD-10-CM

## 2020-05-27 LAB — CUP PACEART REMOTE DEVICE CHECK
Battery Remaining Longevity: 25 mo
Battery Voltage: 2.92 V
Brady Statistic AP VP Percent: 0 %
Brady Statistic AP VS Percent: 0 %
Brady Statistic AS VP Percent: 98.77 %
Brady Statistic AS VS Percent: 1.23 %
Brady Statistic RA Percent Paced: 0 %
Brady Statistic RV Percent Paced: 99.12 %
Date Time Interrogation Session: 20210817130044
Implantable Lead Implant Date: 20121128
Implantable Lead Implant Date: 20121128
Implantable Lead Location: 753858
Implantable Lead Location: 753860
Implantable Lead Model: 4194
Implantable Lead Model: 5076
Implantable Pulse Generator Implant Date: 20121128
Lead Channel Impedance Value: 342 Ohm
Lead Channel Impedance Value: 4047 Ohm
Lead Channel Impedance Value: 4047 Ohm
Lead Channel Impedance Value: 418 Ohm
Lead Channel Impedance Value: 437 Ohm
Lead Channel Impedance Value: 475 Ohm
Lead Channel Impedance Value: 494 Ohm
Lead Channel Impedance Value: 513 Ohm
Lead Channel Impedance Value: 608 Ohm
Lead Channel Pacing Threshold Amplitude: 0.625 V
Lead Channel Pacing Threshold Amplitude: 1.375 V
Lead Channel Pacing Threshold Pulse Width: 0.4 ms
Lead Channel Pacing Threshold Pulse Width: 0.4 ms
Lead Channel Sensing Intrinsic Amplitude: 12.75 mV
Lead Channel Sensing Intrinsic Amplitude: 12.75 mV
Lead Channel Setting Pacing Amplitude: 2 V
Lead Channel Setting Pacing Amplitude: 2.5 V
Lead Channel Setting Pacing Pulse Width: 0.4 ms
Lead Channel Setting Pacing Pulse Width: 0.4 ms
Lead Channel Setting Sensing Sensitivity: 4 mV

## 2020-05-29 NOTE — Progress Notes (Signed)
Remote pacemaker transmission.   

## 2020-06-08 ENCOUNTER — Other Ambulatory Visit: Payer: Self-pay | Admitting: Internal Medicine

## 2020-07-16 ENCOUNTER — Telehealth: Payer: Self-pay | Admitting: Internal Medicine

## 2020-07-16 NOTE — Progress Notes (Signed)
  Chronic Care Management   Note  07/16/2020 Name: [name] MRN: [id number] DOB: July 16, 1941  Bradley Searles Sr. is a 79 y.o. year old male who is a primary care patient of [name], MD. I reached out to Bradley Searles Sr. by phone today in response to a referral sent by [name]'s PCP, [name], MD.   [name] was given information about Chronic Care Management services today including:  1. CCM service includes personalized support from designated clinical staff supervised by his physician, including individualized plan of care and coordination with other care providers 2. 24/7 contact phone numbers for assistance for urgent and routine care needs. 3. Service will only be billed when office clinical staff spend 20 minutes or more in a month to coordinate care. 4. Only one practitioner may furnish and bill the service in a calendar month. 5. The patient may stop CCM services at any time (effective at the end of the month) by phone call to the office staff.   Patient agreed to services and verbal consent obtained.   Follow up plan:   Carley Perdue [employer] Scheduler

## 2020-07-28 ENCOUNTER — Encounter: Payer: Self-pay | Admitting: Internal Medicine

## 2020-07-28 ENCOUNTER — Ambulatory Visit (INDEPENDENT_AMBULATORY_CARE_PROVIDER_SITE_OTHER): Payer: Medicare Other | Admitting: Internal Medicine

## 2020-07-28 ENCOUNTER — Ambulatory Visit (HOSPITAL_BASED_OUTPATIENT_CLINIC_OR_DEPARTMENT_OTHER)
Admission: RE | Admit: 2020-07-28 | Discharge: 2020-07-28 | Disposition: A | Discharge status: Home / Self Care | Payer: Medicare Other | Source: Ambulatory Visit | Attending: Internal Medicine | Admitting: Internal Medicine

## 2020-07-28 ENCOUNTER — Other Ambulatory Visit: Payer: Self-pay

## 2020-07-28 ENCOUNTER — Telehealth: Payer: Self-pay | Admitting: General Practice

## 2020-07-28 VITALS — BP 97/63 | HR 73 | Temp 97.9°F | Resp 18 | Ht 69.0 in | Wt 205.2 lb

## 2020-07-28 DIAGNOSIS — J441 Chronic obstructive pulmonary disease with (acute) exacerbation: Secondary | ICD-10-CM | POA: Insufficient documentation

## 2020-07-28 DIAGNOSIS — E118 Type 2 diabetes mellitus with unspecified complications: Secondary | ICD-10-CM | POA: Diagnosis not present

## 2020-07-28 DIAGNOSIS — I10 Essential (primary) hypertension: Secondary | ICD-10-CM

## 2020-07-28 MED ORDER — PREDNISONE 10 MG PO TABS: ORAL_TABLET | ORAL | 0 refills | Status: DC

## 2020-07-28 MED ORDER — DOXYCYCLINE HYCLATE 100 MG PO TABS: 100.0000 mg | ORAL_TABLET | Freq: Two times a day (BID) | ORAL | 0 refills | Status: DC

## 2020-07-28 NOTE — Telephone Encounter (Signed)
Spoke with Dr Drue Novel  He states he seen this pt today in clinic Pt with 2 day hx of increased cough  His sats were 82% 3lpm--increased to 86% when placed on 4lpm o2  He is sending him for covid testing, as well as a CBC, CXR, pred and doxy  Dr Nicole Kindred asking if MW thinks that all of this is okay or does he want to add anything to tx plan  Dr [name]- please advise thanks!

## 2020-07-28 NOTE — Progress Notes (Signed)
Subjective:    Patient ID: Bradley Sit., male    DOB: 08-30-1941, 79 y.o.   MRN: [id number]  DOS:  07/28/2020 Type of visit - description: Routine visit. He is here for a routine visit but he is not feeling well for the last 2 days: Tired, nasal congestion, shortness of breath, increased cough from baseline. He has noted clear to yellowish nasal discharge but he denies sputum.  O2 sat was noted to be low today.  Wt Readings from Last 3 Encounters:  07/28/20 205 lb 4 oz (93.1 kg)  03/25/20 208 lb 2 oz (94.4 kg)  03/21/20 204 lb 6.4 oz (92.7 kg)      Review of Systems Denies fever or chills. He has been checking his temperature. No chest pain, no edema, no myalgias. Admits to some increased chest congestion.   Past Medical History:  Diagnosis Date  . Abnormal CT scan, chest 2012   CT chest, several lymphadenopathies. Sees pulmonary  . Anemia    intermittent  . Arthritis    "?back" (09/19/2018)  . Atrial fibrillation (HCC)    s/p AV node ablation & BiV PPM implantation 09/08/11 (op dictation pending)  . BPH (benign prostatic hyperplasia)    Saw Dr [name] 2004, normal renal u/s  . Bronchitis 08/26/2017  . CAD (coronary artery disease)   . CHF (congestive heart failure) (HCC)    Thought primarily to be non-systolic although EF down (EF 37-62% 12/2010, down to 35-40% 09/05/11), cath 2008 with no CAD, nuclear study 07/2011 showing Small area of reversibility in the distal ant/lat wall the left ventricle suspicious for ischemia/septal wall HK but felt to be low risk  (per D/C Summary 07/2011)  . Chronic bronchitis (HCC)    "get it ~ q yr"  . Heart murmur   . HLD (hyperlipidemia)   . HTN (hypertension)   . 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 Insomnia   . Migraines    "very very rare"  . On home oxygen therapy    "2L; 24/7" (09/19/2018)  . Pacemaker   . Peripheral vascular disease (HCC)    ??  . Pleural effusion 2008   S/p  decortication  . Pulmonary HTN (HCC)    per cath 2008  . Type II diabetes mellitus (HCC) 1999    Past Surgical History:  Procedure Laterality Date  . BI-VENTRICULAR PACEMAKER INSERTION Left 09/08/2011   Procedure: BI-VENTRICULAR PACEMAKER INSERTION (CRT-P);  Surgeon: Marinus Maw, MD;  Location: [facility] CATH LAB;  Service: Cardiovascular;  Laterality: Left;  . CARDIAC CATHETERIZATION     "couple times; never had balloon or stent" (09/19/2018)  . CATARACT EXTRACTION W/ INTRAOCULAR LENS  IMPLANT, BILATERAL Bilateral 08/2018  . COLONOSCOPY  03/10/11   normal  . INSERT / REPLACE / REMOVE PACEMAKER  09/08/11   pacemaker placement  . LUNG DECORTICATION    . PLEURAL SCARIFICATION    . pneumothorax with fibrothorax  ~ 2010  . TONSILLECTOMY     "as a kid"     Allergies as of 07/28/2020      Reactions   Hydrocodone Other (See Comments)   "given to him in the hospital; went thru withdrawals once home; dr said not to take it again" (09/27/2012)   Tramadol Other (See Comments)   "given to him in the hospital; went thru withdrawals once home; dr said not to take it again" (09/27/2012)   Tadalafil Other (See Comments)   headache, backache  Avelox [moxifloxacin Hydrochloride] Itching, Other (See Comments)   Headache      Medication List       Accurate as of July 28, 2020 11:59 PM. If you have any questions, ask your nurse or doctor.        acetaminophen 500 MG tablet Commonly known as: TYLENOL Take 500 mg by mouth every 6 (six) hours as needed for headache.   amLODipine 10 MG tablet Commonly known as: NORVASC TAKE 1 TABLET DAILY   aspirin 81 MG tablet Take 81 mg by mouth every morning.   atorvastatin 20 MG tablet Commonly known as: LIPITOR TAKE 1 TABLET AT BEDTIME   budesonide 0.5 MG/2ML nebulizer solution Commonly known as: PULMICORT Take 2 mLs (0.5 mg total) by nebulization 2 (two) times daily.   calcium-vitamin D 500-200 MG-UNIT tablet Commonly known as: OSCAL WITH  D Take 1 tablet by mouth every morning.   carvedilol 25 MG tablet Commonly known as: COREG Take 1 tablet (25 mg total) by mouth 2 (two) times daily with a meal.   doxycycline 100 MG tablet Commonly known as: VIBRA-TABS Take 1 tablet (100 mg total) by mouth 2 (two) times daily. Started by: [name], MD   furosemide 40 MG tablet Commonly known as: LASIX TAKE 2 TABLETS DAILY EXCEPT ON MONDAY, WEDNESDAY, AND FRIDAY TAKE 2 TABLETS IN THE MORNING A 1 TABLET IN THE AFTERNOON   ipratropium-albuterol 0.5-2.5 (3) MG/3ML Soln Commonly known as: DUONEB Take 3 mLs by nebulization 2 (two) times daily as needed.   losartan 100 MG tablet Commonly known as: COZAAR Take 0.5 tablets (50 mg total) by mouth daily.   multivitamins ther. w/minerals Tabs tablet Take 1 tablet by mouth daily.   nitroGLYCERIN 0.4 MG SL tablet Commonly known as: NITROSTAT Place 1 tablet (0.4 mg total) under the tongue every 5 (five) minutes x 3 doses as needed for chest pain.   onetouch ultrasoft lancets Check blood sugars no more than twice daily   OneTouch Verio test strip Generic drug: glucose blood USE TO CHECK BLOOD SUGAR NO MORE THAN TWICE A DAY   pantoprazole 40 MG tablet Commonly known as: PROTONIX Take 1 tablet (40 mg total) by mouth every morning.   predniSONE 10 MG tablet Commonly known as: DELTASONE 4 tablets x 2 days, 3 tabs x 2 days, 2 tabs x 2 days, 1 tab x 2 days Started by: [name], MD   sitaGLIPtin 100 MG tablet Commonly known as: Januvia Take 1 tablet (100 mg total) by mouth daily.   tamsulosin 0.4 MG Caps capsule Commonly known as: FLOMAX Take 1 capsule (0.4 mg total) by mouth daily after supper.   Tylenol PM Extra Strength 25-500 MG Tabs tablet Generic drug: diphenhydramine-acetaminophen Take 1 tablet by mouth at bedtime.          Objective:   Physical Exam BP 97/63 (BP Location: Right Arm, Patient Position: Sitting, Cuff Size: Normal)   Pulse 73   Temp 97.9 F (36.6 C)  (Oral)   Resp 18   Ht 5\' 9"  (1.753 m)   Wt 205 lb 4 oz (93.1 kg)   SpO2 (!) 82%   BMI 30.31 kg/m  General:   Well developed, NAD, BMI noted. HEENT:  Normocephalic . Face symmetric, atraumatic. Nose is slightly congested Lungs:  Decreased breath sounds, few rhonchi, few wheezes bilaterally, no crackles. Respiratory rate 26/min Heart: RRR,  no murmur.  Lower extremities: no pretibial edema bilaterally  Skin: Not pale. Not jaundice Neurologic:  alert &  oriented X3.  Speech normal, gait not tested, sits in a wheelchair Psych--  Cognition and judgment appear intact.  Cooperative with normal attention span and concentration.  Behavior appropriate. No anxious or depressed appearing.      Assessment     Assessment  DM HTN Hyperlipidemia GERD Anxiety- insomnia  BPH Cardiovascular:Dr Ladona Ridgel  --  Cath x 2 17510,[id number]: no significant CAD -- CHF, non-ischemic  --Atrial fibrillation; h/o AV node ablation, has a Pacemaker --h/o IC bleeding: Not anticoagulated --Peripheral vascular disease? Pulmonary: Dr [name], last OV 10-18-2014, f/u prn --COPD, severe, chronic resp failure, night O2 prn, has portable O2  --Pleural effusion s/p decortication --Pulmonary hypertension  Stasis dermatitis   PLAN: COPD, chronic respiratory failure with acute exacerbation Increased cough and shortness of breath for the last 2 days. O2%  in 3 L at home 90%, O2 sat today upon arrival 82%, oxygen increased for 4 L, then O2 sat increased 86%. Not tachycardic (on BBs), slightly tachypneic with RR of 26, not toxic appearing. Had COVID vaccination [employer] April 2021, has not gotten the flu shot just yet. I placed a call to discuss with pulmonary For now the plan is:  Covid testing, chest x-ray, CBC, prednisone, monitor CBGs and O2 sats, doxycycline. ER if worse. See AVS DM:On Januvia, check A1c. HTN, CHF, A. fib:   weight at baseline. BP in the low side. He is on amlodipine, carvedilol,  losartan, lasix . Check a BMP, hold losartan x2 doses, monitor BPs, see AVS RTC 2 weeks   This visit occurred during the SARS-CoV-2 public health emergency.  Safety protocols were in place, including screening questions prior to the visit, additional usage of staff PPE, and extensive cleaning of exam room while observing appropriate contact time as indicated for disinfecting solutions.

## 2020-07-28 NOTE — Progress Notes (Signed)
Pre visit review using our clinic review tool, if applicable. No additional management support is needed unless otherwise documented below in the visit note. 

## 2020-07-28 NOTE — Patient Instructions (Addendum)
Your blood pressure is low, hold the next 2 doses of losartan. Check the  blood pressure daily BP GOAL is between 110/65 and  135/85. Call the office with your blood pressure readings in 3 days  For your breathing: Get tested for Covid ASAP Mucinex DM Start prednisone and doxycycline, prescriptions sent Check your blood sugars, if they get more than 180/200 let me know May need to  advise you to go to the ER depending on results. Go to the ER if your oxygen drops below 86% consistently, if you feel much worse with increased difficulty breathing, fever, chills, nausea.   GO TO THE LAB : Get the blood work     GO TO THE FRONT DESK, PLEASE SCHEDULE YOUR APPOINTMENTS Come back for a checkup in 2 weeks  STOP BY THE FIRST FLOOR:  get the XR

## 2020-07-29 LAB — CBC WITH DIFFERENTIAL/PLATELET
Absolute Monocytes: 826 cells/uL (ref 200–950)
Basophils Absolute: 47 cells/uL (ref 0–200)
Basophils Relative: 0.8 %
Eosinophils Absolute: 77 cells/uL (ref 15–500)
Eosinophils Relative: 1.3 %
HCT: 42.4 % (ref 38.5–50.0)
Hemoglobin: 14.5 g/dL (ref 13.2–17.1)
Lymphs Abs: 1552 cells/uL (ref 850–3900)
MCH: 31.7 pg (ref 27.0–33.0)
MCHC: 34.2 g/dL (ref 32.0–36.0)
MCV: 92.6 fL (ref 80.0–100.0)
MPV: 11 fL (ref 7.5–12.5)
Monocytes Relative: 14 %
Neutro Abs: 3398 cells/uL (ref 1500–7800)
Neutrophils Relative %: 57.6 %
Platelets: 159 10*3/uL (ref 140–400)
RBC: 4.58 10*6/uL (ref 4.20–5.80)
RDW: 12.9 % (ref 11.0–15.0)
Total Lymphocyte: 26.3 %
WBC: 5.9 10*3/uL (ref 3.8–10.8)

## 2020-07-29 LAB — HEMOGLOBIN A1C
Hgb A1c MFr Bld: 5.8 % of total Hgb — ABNORMAL HIGH (ref <5.7)
Mean Plasma Glucose: 120 (calc)
eAG (mmol/L): 6.6 (calc)

## 2020-07-29 LAB — BASIC METABOLIC PANEL
BUN: 12 mg/dL (ref 7–25)
CO2: 33 mmol/L — ABNORMAL HIGH (ref 20–32)
Calcium: 9 mg/dL (ref 8.6–10.3)
Chloride: 102 mmol/L (ref 98–110)
Creat: 0.93 mg/dL (ref 0.70–1.18)
Glucose, Bld: 124 mg/dL — ABNORMAL HIGH (ref 65–99)
Potassium: 4.9 mmol/L (ref 3.5–5.3)
Sodium: 142 mmol/L (ref 135–146)

## 2020-07-29 NOTE — Assessment & Plan Note (Signed)
COPD, chronic respiratory failure with acute exacerbation Increased cough and shortness of breath for the last 2 days. O2%  in 3 L at home 90%, O2 sat today upon arrival 82%, oxygen increased for 4 L, then O2 sat increased 86%. Not tachycardic (on BBs), slightly tachypneic with RR of 26, not toxic appearing. Had COVID vaccination [employer] April 2021, has not gotten the flu shot just yet. I placed a call to discuss with pulmonary For now the plan is:  Covid testing, chest x-ray, CBC, prednisone, monitor CBGs and O2 sats, doxycycline. ER if worse. See AVS DM:On Januvia, check A1c. HTN, CHF, A. fib:   weight at baseline. BP in the low side. He is on amlodipine, carvedilol, losartan, lasix . Check a BMP, hold losartan x2 doses, monitor BPs, see AVS RTC 2 weeks

## 2020-07-29 NOTE — Telephone Encounter (Signed)
Sorry I was out of the office 10/18 and I'm in [place] this week but rx as outlined looks fine/ cxr s pna.   I can see him in clinic next week if needed or you cancall me at [phone] if any immediate needs.

## 2020-07-29 NOTE — Telephone Encounter (Signed)
Spoke with the patient, labs and x-ray okay. States that although his O2 sat is still range from 85 to 90% he feels "100% better", decrease shortness of breath and decreased cough. Given the hypoxemia will ask [name] to see next week. Will forward this to [place], CMA: Please schedule an appointment for the patient to be seen next week

## 2020-07-30 NOTE — Telephone Encounter (Signed)
Dr [name], please advise if okay to schedule pt for next wk- will need the 30 min slot at end of day since last seen in 2016. Thanks!

## 2020-07-30 NOTE — Telephone Encounter (Signed)
Lm for patient.  

## 2020-07-30 NOTE — Telephone Encounter (Signed)
That's fine

## 2020-07-31 NOTE — Telephone Encounter (Signed)
Patient coming in 10/25 at 4:15 and aware of appt time.  Requested Patrice to put patient on the schedule due to me not having overbooking priviliges.  ta

## 2020-07-31 NOTE — Telephone Encounter (Signed)
Appt placed for pt 08/04/20 4:15 with Dr [name].

## 2020-08-04 ENCOUNTER — Other Ambulatory Visit: Payer: Self-pay

## 2020-08-04 ENCOUNTER — Encounter: Payer: Self-pay | Admitting: Internal Medicine

## 2020-08-04 ENCOUNTER — Ambulatory Visit (INDEPENDENT_AMBULATORY_CARE_PROVIDER_SITE_OTHER): Payer: Medicare Other | Admitting: Internal Medicine

## 2020-08-04 DIAGNOSIS — I2781 Cor pulmonale (chronic): Secondary | ICD-10-CM | POA: Diagnosis not present

## 2020-08-04 DIAGNOSIS — J9612 Chronic respiratory failure with hypercapnia: Secondary | ICD-10-CM | POA: Diagnosis not present

## 2020-08-04 DIAGNOSIS — J449 Chronic obstructive pulmonary disease, unspecified: Secondary | ICD-10-CM

## 2020-08-04 DIAGNOSIS — J9611 Chronic respiratory failure with hypoxia: Secondary | ICD-10-CM | POA: Diagnosis not present

## 2020-08-04 NOTE — Progress Notes (Signed)
Subjective:    Patient ID: Bradley [name], male    DOB: [birth year]     MRN: [id number]   Brief patient profile:  56 yowm quit smoking in 1982 prev pulm  eval in 2008 with pleural effusions but all better with no resp issues until Aug 2012 when dx with bronchitis and "downhill since" so referred by Dr Drue Novel back to pulmonary clinic in Nov 2012 and ultimately proven to have no sign copd by pft's 10/28/2011 and sob attributed to chf.   HPI 08/27/2011 1st pulmonary eval in em[facility] era cc new onset cough indolent persistent, daily x 3 months,  typically p supper with minimal yellow mucus  Assoc with new sob x sev flights and stop at top of 3 rd -  50% better better p rx with pna no better with albuterol.  No overt sinus or reflux symptoms rec Prednisone 10 mg take  4 each am x 2 days,   2 each am x 2 days,  1 each am x2days and stop  Change lopressor to 50mg  one half in am and one half in pm Protonix 30-60 min before bfast and Pepcid 20mg  one at bedtime  GERD diet   Admit date: 09/20/2011  Discharge date: 09/23/2011  Discharge Diagnoses  Systolic CHF, acute on chronic  Permanent atrial fibrillation  Hypokalemia  Lymphadenopathy  09/24/2011 Bradley [name] cc Hospital follow-up. Was hospitalized for trouble breathing. Patient states he is better since getting out of the hospital.  rec Only use your albuterol (proventil or ventolin)  as a rescue medication to be used if you can't catch your breath > not using  Keep on 02 24 hours per day @ 2lpm     10/28/2011 f/u ov/Shatana Saxton cc Shortness of Breath Improved since last visit on 02 and with rx of chf, no cough or daytime need for saba - on 02 can walk flat and slow as much as desired rec Stay on 02 24 hours per day - this will help your heart You do not have significant lung problems other than what is related to the congestive heart failure so pulmonary follow up will be as needed     Date of Admission: 08/22/2014 Date of Discharge: 08/25/2014  Attending  Physician:MCCLUNG,JEFFREY T  Patient's 13/09/2014 08/27/2014, MD  Consults: PCCM  Disposition: D/C home   Discharge Diagnoses: Acute on chronic resp failure with hypoxia SSCP Hx of COPD  Chronic combined diastolic and systolic CHF  Hx afib s/p ablation w/ pacer Nov 2012 HTN HLD DM2  Initial presentation: 79 y.o. male with a hx of HTN, HLD, and systolic and diastolic chf who presented from outpatient clinic after failing tx for bronchitis with doxycycline. Pt reportedly progressively became more dyspneic and sob. A cxr was found to be without evidence of PNA or pulm edema. He was noted to have O2 sats in the mid-80's on O2. The patient was then directly admitted to stepdown.   Hospital Course:  Acute on chronic resp failure with hypoxia Felt to be due to AECOPD - tapered steroids to oral pred from IV solumedrol - weaned O2 to home dose of 2L w/ sats 90% or > - cleared for d/c home w/ completion of steroid taper and oral abx tx - spiriva and albuterol per PCCM recs  SSCP Likely situational stress related (family argument), but pt high risk for CAD - EKGs unrevealing - cycled enzymes all negative - sx resolved at time of d/c   Hx of COPD  Has not  smoked in 30 years, but was a "very heavy" smoker - on 2LPM O2 at all times at home   Chronic combined diastolic and systolic CHF  EF 22-44% via TTE Sept 2013 - repeat TTE notes improvement in EF - no clinical signs of CHF exac  Hx afib s/p ablation w/ pacer Nov 2012 Followed by [name] EP  HTN BP well controlled at present   HLD Resume advicor   DM2 CBG uncontrolled in setting of steroid use - taper steroids quickly and resume usual tx at home post d/c   09/06/2014 post hosp f/u/ transition of care ov / re-establish care / /Bradley [name] / newly started on spiriva despite nl baseline pfts  Chief Complaint  Patient presents with  . Hospitalization Follow-up    Feeling well since leaving the hospital. Reports coughing from time to time,  when he does it came be dry but at times it can be productive. Denies chest tightness, SOB or wheezing.  breathing much better now= back to baseline, ok x 3 flights/ uses 2lpm when walking dog and sometimes shopping    At baseline before deterioration rarely needed any albuterol at all ? How compliant he was with prev 02 recs Main c/o is day > noct cough dry > wet still present and at times quite severe  rec Stop spiriva as you have no copd  Only use your albuterol (proair)  Pantoprazole (protonix) 40 mg   Take 30-60 min before first meal of the day and Pepcid ac  20 mg one bedtime until return to office - this is the best way to tell whether stomach acid is contributing to your problem.  GERD diet    10/18/2014 f/u ov/Bradley [name] re:  Mild intermittent asthma  Chief Complaint  Patient presents with  . Follow-up    PFT done today. Breathing is "excellent".  No co's today.     no need for any gerd rx or inhalers x one month  rec In the event of a recurrent cough please Try prilosec 20mg   Take 30-60 min before first meal of the day and Pepcid 20 mg one bedtime until cough is completely gone    08/04/2020  Re-establish  ov/Bradley [name] re: copd 0 vs mild intermittent asthma on duoneb /pulmicort but not clear how / when takes this or any other med  Chief Complaint  Patient presents with  . Follow-up    DOE  Dyspnea:  MMRC2 = can't walk a nl pace on a flat grade s sob but does fine slow and flat  Cough: slt yellow / more as day day goes on Sleeping:  Flat bed / one pillow  SABA use: ? Exactly how much  02: started 2012 but for the last 2 years has been on 3-4 lpm with sats mid 90s    No obvious day to day or daytime variability or assoc  mucus plugs or hemoptysis or cp or chest tightness, subjective wheeze or overt sinus or hb symptoms.   Sleeping  without nocturnal  or early am exacerbation  of respiratory  c/o's or need for noct saba. Also denies any obvious fluctuation of symptoms with weather or  environmental changes or other aggravating or alleviating factors except as outlined above   No unusual exposure hx or h/o childhood pna/ asthma or knowledge of premature birth.  Current Allergies, Complete Past Medical History, Past Surgical History, Family History, and Social History were reviewed in [employer] record.  ROS  The following are  not active complaints unless bolded Hoarseness, sore throat, dysphagia, dental problems, itching, sneezing,  nasal congestion or discharge of excess mucus or purulent secretions, ear ache,   fever, chills, sweats, unintended wt loss or wt gain, classically pleuritic or exertional cp,  orthopnea pnd or arm/hand swelling  or leg swelling, presyncope, palpitations, abdominal pain, anorexia, nausea, vomiting, diarrhea  or change in bowel habits or change in bladder habits, change in stools or change in urine, dysuria, hematuria,  rash, arthralgias, visual complaints, headache, numbness, weakness or ataxia or problems with walking or coordination,  change in mood or  memory.        Current Meds - - NOTE:   Unable to verify as accurately reflecting what pt takes     Medication Sig  . acetaminophen (TYLENOL) 500 MG tablet Take 500 mg by mouth every 6 (six) hours as needed for headache.  Marland Kitchen amLODipine (NORVASC) 10 MG tablet TAKE 1 TABLET DAILY  . aspirin 81 MG tablet Take 81 mg by mouth every morning.   Marland Kitchen atorvastatin (LIPITOR) 20 MG tablet TAKE 1 TABLET AT BEDTIME  . budesonide (PULMICORT) 0.5 MG/2ML nebulizer solution Take 2 mLs (0.5 mg total) by nebulization 2 (two) times daily.  . calcium-vitamin D (OSCAL WITH D) 500-200 MG-UNIT tablet Take 1 tablet by mouth every morning.  . carvedilol (COREG) 25 MG tablet Take 1 tablet (25 mg total) by mouth 2 (two) times daily with a meal.  . diphenhydramine-acetaminophen (TYLENOL PM EXTRA STRENGTH) 25-500 MG TABS tablet Take 1 tablet by mouth at bedtime.  Marland Kitchen doxycycline (VIBRA-TABS) 100 MG tablet  Take 1 tablet (100 mg total) by mouth 2 (two) times daily.  . furosemide (LASIX) 40 MG tablet TAKE 2 TABLETS DAILY EXCEPT ON MONDAY, WEDNESDAY, AND FRIDAY TAKE 2 TABLETS IN THE MORNING A 1 TABLET IN THE AFTERNOON  . ipratropium-albuterol (DUONEB) 0.5-2.5 (3) MG/3ML SOLN Take 3 mLs by nebulization 2 (two) times daily as needed.  . Lancets (ONETOUCH ULTRASOFT) lancets Check blood sugars no more than twice daily  . losartan (COZAAR) 100 MG tablet Take 0.5 tablets (50 mg total) by mouth daily.  . Multiple Vitamins-Minerals (MULTIVITAMINS THER. W/MINERALS) TABS tablet Take 1 tablet by mouth daily.  . nitroGLYCERIN (NITROSTAT) 0.4 MG SL tablet Place 1 tablet (0.4 mg total) under the tongue every 5 (five) minutes x 3 doses as needed for chest pain.  Letta Pate VERIO test strip USE TO CHECK BLOOD SUGAR NO MORE THAN TWICE A DAY  . pantoprazole (PROTONIX) 40 MG tablet Take 1 tablet (40 mg total) by mouth every morning.  . predniSONE (DELTASONE) 10 MG tablet    . sitaGLIPtin (JANUVIA) 100 MG tablet Take 1 tablet (100 mg total) by mouth daily.  . tamsulosin (FLOMAX) 0.4 MG CAPS capsule Take 1 capsule (0.4 mg total) by mouth daily after supper.                      Objective:   Physical Exam  Wt Readings from Last 3 Encounters:  08/04/20 195 lb 9.6 oz (88.7 kg)  07/28/20 205 lb 4 oz (93.1 kg)  03/25/20 208 lb 2 oz (94.4 kg)  08/27/2011   217 > 09/24/2011  210 >10/28/2011 217  Vital signs reviewed - Note on arrival 08/04/2020  02 sats  95% on 4lpm pulsed      Edentulous chronically ill appearing wm nad with congested sounding cough  HEENT : pt wearing mask not removed for exam due to covid -  19 concerns.   NECK :  without JVD/Nodes/TM/ nl carotid upstrokes bilaterally   LUNGS: no acc muscle use,  Min barrel  contour chest wall with bilateral faint  Insp/exp rhonchi p last neb 6 h prior to OV    and  without cough on insp or exp maneuvers and min  Hyperresonant  to  percussion bilaterally      CV: RRR  no s3 or murmur or increase in P2, and no edema   ABD:  soft and nontender with pos end  insp Hoover's  in the supine position. No bruits or organomegaly appreciated, bowel sounds nl  MS:   Nl gait/  ext warm without deformities, calf tenderness, cyanosis or clubbing No obvious joint restrictions   SKIN: warm and dry without lesions    NEURO:  alert, approp, nl sensorium with  no motor or cerebellar deficits apparent.             I personally reviewed images and agree with radiology impression as follows:  CXR:   07/28/20  Pa and lat 1. Minimal change from comparison exam 11/26/2019. 2. Bibasilar linear opacities likely represent interstitial edema.     Assessment & Plan:

## 2020-08-04 NOTE — Patient Instructions (Addendum)
Take both budesonide and duoneb twice daily and in between just the duoneb if you feel like you need it  Please schedule a follow up office visit in 6 weeks,  with pfts and bring all medications and solutions  Late Add: Needs echo pfts and on return plus alpha one screen  consider for HRCT and change coreg to bisoprolol ?

## 2020-08-05 ENCOUNTER — Encounter: Payer: Self-pay | Admitting: Internal Medicine

## 2020-08-05 DIAGNOSIS — I2781 Cor pulmonale (chronic): Secondary | ICD-10-CM | POA: Insufficient documentation

## 2020-08-05 NOTE — Assessment & Plan Note (Addendum)
Started on 02 2lpm at discharge 09/23/11   - PFT's 10/28/2011  FEV1  1.40 (51%)  with ratio 70 and no better p B2 and DLCO 53 corrects to 101    - PFT's 10/18/2014    FEV1 1.71 (59%) with ratio 68 and no sign change p B2 and DLCO 53 corrects to 85  -  HC03   07/28/20  = 33   He does meet criteria for copd now and has ? Underlying mild ILD ? Asbestosis or related to prior ALI as doe not really appear to be progressive but ideally will need HRCT to sort out.  For now no change in 02 rx but advised:  Make sure you check your oxygen saturations at highest level of activity to be sure it stays over 90% and adjust  02 flow upward to maintain this level if needed but remember to turn it back to previous settings when you stop (to conserve your supply).

## 2020-08-05 NOTE — Assessment & Plan Note (Addendum)
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     Medical decision making was a moderate level of complexity in this case because of  two chronic conditions /diagnoses requiring extra time for  H and P, chart review, counseling,  and generating customized AVS unique to this office visit with pt not seen > 3 y and charting.   Each maintenance medication was reviewed in detail including emphasizing most importantly the difference between maintenance and prns and under what circumstances the prns are to be triggered using an action plan format where appropriate. Please see avs for details which were reviewed in writing by both me and my nurse and patient given a written copy highlighted where appropriate with yellow highlighter for the patient's continued care at home along with an updated version of their medications.  Patient was asked to maintain medication reconciliation by comparing this list to the actual medications being used at home and to contact this office right away if there is a conflict or discrepancy.

## 2020-08-05 NOTE — Assessment & Plan Note (Addendum)
Quit smoking 1982 age 79  - PFT's 10/18/2014    FEV1 1.71 (59%) with ratio 68 and no sign change p B2 and DLCO 53% corrects to 85   DDX of  difficult airways management almost all start with A and  include Adherence, Ace Inhibitors, Acid Reflux, Active Sinus Disease, Alpha 1 Antitripsin deficiency, Anxiety masquerading as Airways dz,  ABPA,  Allergy(esp in young), Aspiration (esp in elderly), Adverse effects of meds,  Active smoking or vaping, A bunch of PE's (a small clot burden can't cause this syndrome unless there is already severe underlying pulm or vascular dz with poor reserve) plus two Bs  = Bronchiectasis and Beta blocker use..and one C= CHF   Adherence is always the initial "prime suspect" and is a multilayered concern that requires a "trust but verify" approach in every patient - starting with knowing how to use medications, especially inhalers, correctly, keeping up with refills and understanding the fundamental difference between maintenance and prns vs those medications only taken for a very short course and then stopped and not refilled.  - he has extremely poor insight into meds esp his resp rx so before we make any changes rec return with all meds in hand using a trust but verify approach to confirm accurate Medication  Reconciliation The principal here is that until we are certain that the  patients are doing what we've asked, it makes no sense to ask them to do more.   ? Allergy/ asthma component >  Continue pulmicort 0.5 bid (not prn)   ? Alpha one AT def > unlikely s evidence of emphysema on CT but should be checked on return   ? Adverse drug effects > none x for coreg listed (see below)  ? Acid (or non-acid) GERD > always difficult to exclude as up to 75% of pts in some series report no assoc GI/ Heartburn symptoms> rec continue max (24h)  acid suppression and diet restrictions/ reviewed     ? BB effects - on moderately high doses of coreg : In the setting of respiratory symptoms  of unknown etiology,  It would be preferable to use bystolic, the most beta -1  selective Beta blocker available in sample form, with bisoprolol the most selective generic choice  on the market, at least on a trial basis, to make sure the spillover Beta 2 effects of the less specific Beta blockers are not contributing to this patient's symptoms.  >>> rec consider change to bisoprolol once we're sure chf is not one of his active problems   ? CHF > needs repeat echo prior to next ov

## 2020-08-08 ENCOUNTER — Telehealth: Payer: Self-pay | Admitting: *Deleted

## 2020-08-08 DIAGNOSIS — I428 Other cardiomyopathies: Secondary | ICD-10-CM

## 2020-08-08 NOTE — Telephone Encounter (Signed)
-----   Message from Nyoka Cowden, MD sent at 08/05/2020  6:13 AM EDT ----- After review of record rec he get 2d echo and pfts prior to next ov

## 2020-08-08 NOTE — Telephone Encounter (Signed)
Spoke with the pt  He is already scheduled for PFT  He agreed to have the ECHO done and I have sent order to [facility] for this

## 2020-08-11 ENCOUNTER — Encounter: Payer: Self-pay | Admitting: Internal Medicine

## 2020-08-11 ENCOUNTER — Ambulatory Visit (INDEPENDENT_AMBULATORY_CARE_PROVIDER_SITE_OTHER): Payer: Medicare Other | Admitting: Internal Medicine

## 2020-08-11 ENCOUNTER — Other Ambulatory Visit: Payer: Self-pay

## 2020-08-11 VITALS — BP 111/73 | HR 81 | Temp 97.7°F | Resp 20 | Ht 69.0 in | Wt 193.4 lb

## 2020-08-11 DIAGNOSIS — I10 Essential (primary) hypertension: Secondary | ICD-10-CM

## 2020-08-11 DIAGNOSIS — J441 Chronic obstructive pulmonary disease with (acute) exacerbation: Secondary | ICD-10-CM

## 2020-08-11 DIAGNOSIS — Z23 Encounter for immunization: Secondary | ICD-10-CM

## 2020-08-11 DIAGNOSIS — J9612 Chronic respiratory failure with hypercapnia: Secondary | ICD-10-CM

## 2020-08-11 DIAGNOSIS — E118 Type 2 diabetes mellitus with unspecified complications: Secondary | ICD-10-CM

## 2020-08-11 DIAGNOSIS — J9611 Chronic respiratory failure with hypoxia: Secondary | ICD-10-CM

## 2020-08-11 NOTE — Progress Notes (Signed)
Subjective:    Patient ID: Bradley Sit., male    DOB: 1941/09/22, 79 y.o.   MRN: [id number]  DOS:  08/11/2020 Type of visit - description: Follow-up Was recently seen with a COPD exacerbation, he finished prednisone, doxycycline and is better. Essentially back to baseline Saw pulmonology, note reviewed.    Review of Systems Currently with no fever chills Shortness of breath at baseline Cough resolved, no sputum production  Past Medical History:  Diagnosis Date  . Abnormal CT scan, chest 2012   CT chest, several lymphadenopathies. Sees pulmonary  . Anemia    intermittent  . Arthritis    "?back" (09/19/2018)  . Atrial fibrillation (HCC)    s/p AV node ablation & BiV PPM implantation 09/08/11 (op dictation pending)  . BPH (benign prostatic hyperplasia)    Saw Dr [name] 2004, normal renal u/s  . Bronchitis 08/26/2017  . CAD (coronary artery disease)   . CHF (congestive heart failure) (HCC)    Thought primarily to be non-systolic although EF down (EF 02-40% 12/2010, down to 35-40% 09/05/11), cath 2008 with no CAD, nuclear study 07/2011 showing Small area of reversibility in the distal ant/lat wall the left ventricle suspicious for ischemia/septal wall HK but felt to be low risk  (per D/C Summary 07/2011)  . Chronic bronchitis (HCC)    "get it ~ q yr"  . Heart murmur   . HLD (hyperlipidemia)   . HTN (hypertension)   . 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 Insomnia   . Migraines    "very very rare"  . On home oxygen therapy    "2L; 24/7" (09/19/2018)  . Pacemaker   . Peripheral vascular disease (HCC)    ??  . Pleural effusion 2008   S/p decortication  . Pulmonary HTN (HCC)    per cath 2008  . Type II diabetes mellitus (HCC) 1999    Past Surgical History:  Procedure Laterality Date  . BI-VENTRICULAR PACEMAKER INSERTION Left 09/08/2011   Procedure: BI-VENTRICULAR PACEMAKER INSERTION (CRT-P);  Surgeon: Marinus Maw, MD;   Location: [facility] CATH LAB;  Service: Cardiovascular;  Laterality: Left;  . CARDIAC CATHETERIZATION     "couple times; never had balloon or stent" (09/19/2018)  . CATARACT EXTRACTION W/ INTRAOCULAR LENS  IMPLANT, BILATERAL Bilateral 08/2018  . COLONOSCOPY  03/10/11   normal  . INSERT / REPLACE / REMOVE PACEMAKER  09/08/11   pacemaker placement  . LUNG DECORTICATION    . PLEURAL SCARIFICATION    . pneumothorax with fibrothorax  ~ 2010  . TONSILLECTOMY     "as a kid"     Allergies as of 08/11/2020      Reactions   Hydrocodone Other (See Comments)   "given to him in the hospital; went thru withdrawals once home; dr said not to take it again" (09/27/2012)   Tramadol Other (See Comments)   "given to him in the hospital; went thru withdrawals once home; dr said not to take it again" (09/27/2012)   Tadalafil Other (See Comments)   headache, backache   Avelox [moxifloxacin Hydrochloride] Itching, Other (See Comments)   Headache      Medication List       Accurate as of August 11, 2020 11:59 PM. If you have any questions, ask your nurse or doctor.        STOP taking these medications   doxycycline 100 MG tablet Commonly known as: VIBRA-TABS Stopped by: [name], MD  predniSONE 10 MG tablet Commonly known as: DELTASONE Stopped by: [name], MD     TAKE these medications   acetaminophen 500 MG tablet Commonly known as: TYLENOL Take 500 mg by mouth every 6 (six) hours as needed for headache.   amLODipine 10 MG tablet Commonly known as: NORVASC TAKE 1 TABLET DAILY   aspirin 81 MG tablet Take 81 mg by mouth every morning.   atorvastatin 20 MG tablet Commonly known as: LIPITOR TAKE 1 TABLET AT BEDTIME   budesonide 0.5 MG/2ML nebulizer solution Commonly known as: PULMICORT Take 2 mLs (0.5 mg total) by nebulization 2 (two) times daily.   calcium-vitamin D 500-200 MG-UNIT tablet Commonly known as: OSCAL WITH D Take 1 tablet by mouth every morning.   carvedilol 25 MG  tablet Commonly known as: COREG Take 1 tablet (25 mg total) by mouth 2 (two) times daily with a meal.   furosemide 40 MG tablet Commonly known as: LASIX TAKE 2 TABLETS DAILY EXCEPT ON MONDAY, WEDNESDAY, AND FRIDAY TAKE 2 TABLETS IN THE MORNING A 1 TABLET IN THE AFTERNOON   ipratropium-albuterol 0.5-2.5 (3) MG/3ML Soln Commonly known as: DUONEB Take 3 mLs by nebulization 2 (two) times daily as needed.   losartan 100 MG tablet Commonly known as: COZAAR Take 0.5 tablets (50 mg total) by mouth daily.   multivitamins ther. w/minerals Tabs tablet Take 1 tablet by mouth daily.   nitroGLYCERIN 0.4 MG SL tablet Commonly known as: NITROSTAT Place 1 tablet (0.4 mg total) under the tongue every 5 (five) minutes x 3 doses as needed for chest pain.   onetouch ultrasoft lancets Check blood sugars no more than twice daily   OneTouch Verio test strip Generic drug: glucose blood USE TO CHECK BLOOD SUGAR NO MORE THAN TWICE A DAY   pantoprazole 40 MG tablet Commonly known as: PROTONIX Take 1 tablet (40 mg total) by mouth every morning.   sitaGLIPtin 100 MG tablet Commonly known as: Januvia Take 1 tablet (100 mg total) by mouth daily.   tamsulosin 0.4 MG Caps capsule Commonly known as: FLOMAX Take 1 capsule (0.4 mg total) by mouth daily after supper.   Tylenol PM Extra Strength 25-500 MG Tabs tablet Generic drug: diphenhydramine-acetaminophen Take 1 tablet by mouth at bedtime.          Objective:   Physical Exam BP 111/73 (BP Location: Left Arm, Patient Position: Sitting, Cuff Size: Normal)   Pulse 81   Temp 97.7 F (36.5 C) (Oral)   Resp 20   Ht 5\' 9"  (1.753 m)   Wt 193 lb 6 oz (87.7 kg)   SpO2 93%   BMI 28.56 kg/m  General:   Well developed, NAD, BMI noted. HEENT:  Normocephalic . Face symmetric, atraumatic Lungs:  Decreased breath sounds Normal respiratory effort, no intercostal retractions, no accessory muscle use. Heart: RRR,  no murmur.  Lower extremities: no  pretibial edema bilaterally  Skin: Not pale. Not jaundice Neurologic:  alert & oriented X3.  Speech normal, gait appropriate for age and unassisted Psych--  Cognition and judgment appear intact.  Cooperative with normal attention span and concentration.  Behavior appropriate. No anxious or depressed appearing.      Assessment      Assessment  DM HTN Hyperlipidemia GERD Anxiety- insomnia  BPH Cardiovascular:Dr  --  Cath x 2 Ladona Ridgel: no significant CAD -- CHF, non-ischemic  --Atrial fibrillation; h/o AV node ablation, has a Pacemaker --h/o IC bleeding: Not anticoagulated --Peripheral vascular disease? Pulmonary: Dr [id number], last  OV 10-18-2014, f/u prn --COPD, severe, chronic resp failure, night O2 prn, has portable O2  --Pleural effusion s/p decortication --Pulmonary hypertension  Stasis dermatitis   PLAN: COPD, chronic respiratory failure with exacerbation. Status post doxy, prednisone: Feels back to baseline. Since the last visit, saw pulmonary, 08/04/2020, they are planning to see him in few weeks with PFTs. DM:On Januvia, last A1c was 5.8.  No change HTN: Controlled, continue amlodipine, carvedilol, Lasix, losartan.  Last BMP very good Preventive care: Flu shot today, encouraged to get the [employer] Covid booster as soon as possible, states he will. RTC 4 months   This visit occurred during the SARS-CoV-2 public health emergency.  Safety protocols were in place, including screening questions prior to the visit, additional usage of staff PPE, and extensive cleaning of exam room while observing appropriate contact time as indicated for disinfecting solutions.

## 2020-08-11 NOTE — Progress Notes (Signed)
Pre visit review using our clinic review tool, if applicable. No additional management support is needed unless otherwise documented below in the visit note. 

## 2020-08-11 NOTE — Patient Instructions (Signed)
I am glad you are doing better   GO TO THE FRONT DESK, PLEASE SCHEDULE YOUR APPOINTMENTS Come back for   a checkup in 4 months

## 2020-08-12 NOTE — Assessment & Plan Note (Signed)
COPD, chronic respiratory failure with exacerbation. Status post doxy, prednisone: Feels back to baseline. Since the last visit, saw pulmonary, 08/04/2020, they are planning to see him in few weeks with PFTs. DM:On Januvia, last A1c was 5.8.  No change HTN: Controlled, continue amlodipine, carvedilol, Lasix, losartan.  Last BMP very good Preventive care: Flu shot today, encouraged to get the [employer] Covid booster as soon as possible, states he will. RTC 4 months

## 2020-08-21 ENCOUNTER — Telehealth: Payer: Self-pay | Admitting: Internal Medicine

## 2020-08-21 MED ORDER — LOSARTAN POTASSIUM 100 MG PO TABS
50.0000 mg | ORAL_TABLET | Freq: Every day | ORAL | 1 refills | Status: DC
Start: 2020-08-21 — End: 2020-09-22

## 2020-08-21 NOTE — Telephone Encounter (Signed)
Medication: losartan (COZAAR) 100 MG tablet  Has the patient contacted their pharmacy? No. (If no, request that the patient contact the pharmacy for the refill.) (If yes, when and what did the pharmacy advise?)  Preferred Pharmacy (with phone number or street name): [facility] - [place], [place] - [address]  [address], [place] [address]  Phone:  [phone] Fax:  [phone]  DEA #:  --  Agent: Please be advised that RX refills may take up to 3 business days. We ask that you follow-up with your pharmacy.

## 2020-08-21 NOTE — Telephone Encounter (Signed)
Refills have been sent.  

## 2020-08-26 ENCOUNTER — Ambulatory Visit (INDEPENDENT_AMBULATORY_CARE_PROVIDER_SITE_OTHER): Payer: Medicare Other

## 2020-08-26 DIAGNOSIS — I495 Sick sinus syndrome: Secondary | ICD-10-CM | POA: Diagnosis not present

## 2020-08-26 LAB — CUP PACEART REMOTE DEVICE CHECK
Battery Remaining Longevity: 24 mo
Battery Voltage: 2.91 V
Brady Statistic AP VP Percent: 0 %
Brady Statistic AP VS Percent: 0 %
Brady Statistic AS VP Percent: 98.29 %
Brady Statistic AS VS Percent: 1.71 %
Brady Statistic RA Percent Paced: 0 %
Brady Statistic RV Percent Paced: 98.8 %
Date Time Interrogation Session: 20211116125255
Implantable Lead Implant Date: 20121128
Implantable Lead Implant Date: 20121128
Implantable Lead Location: 753858
Implantable Lead Location: 753860
Implantable Lead Model: 4194
Implantable Lead Model: 5076
Implantable Pulse Generator Implant Date: 20121128
Lead Channel Impedance Value: 342 Ohm
Lead Channel Impedance Value: 399 Ohm
Lead Channel Impedance Value: 4047 Ohm
Lead Channel Impedance Value: 4047 Ohm
Lead Channel Impedance Value: 418 Ohm
Lead Channel Impedance Value: 437 Ohm
Lead Channel Impedance Value: 456 Ohm
Lead Channel Impedance Value: 494 Ohm
Lead Channel Impedance Value: 608 Ohm
Lead Channel Pacing Threshold Amplitude: 0.75 V
Lead Channel Pacing Threshold Amplitude: 1.375 V
Lead Channel Pacing Threshold Pulse Width: 0.4 ms
Lead Channel Pacing Threshold Pulse Width: 0.4 ms
Lead Channel Sensing Intrinsic Amplitude: 12.75 mV
Lead Channel Sensing Intrinsic Amplitude: 12.75 mV
Lead Channel Setting Pacing Amplitude: 2 V
Lead Channel Setting Pacing Amplitude: 2.5 V
Lead Channel Setting Pacing Pulse Width: 0.4 ms
Lead Channel Setting Pacing Pulse Width: 0.4 ms
Lead Channel Setting Sensing Sensitivity: 4 mV

## 2020-08-28 NOTE — Progress Notes (Signed)
Remote pacemaker transmission.   

## 2020-09-03 ENCOUNTER — Ambulatory Visit (HOSPITAL_COMMUNITY): Payer: Medicare Other | Attending: Cardiology

## 2020-09-03 ENCOUNTER — Other Ambulatory Visit: Payer: Self-pay

## 2020-09-03 DIAGNOSIS — I428 Other cardiomyopathies: Secondary | ICD-10-CM | POA: Insufficient documentation

## 2020-09-03 LAB — ECHOCARDIOGRAM COMPLETE
Area-P 1/2: 4.21 cm2
S' Lateral: 3.5 cm

## 2020-09-09 NOTE — Progress Notes (Signed)
Spoke with pt and notified of results per Dr. Wert. Pt verbalized understanding and denied any questions. 

## 2020-09-11 ENCOUNTER — Other Ambulatory Visit: Payer: Self-pay

## 2020-09-11 DIAGNOSIS — I10 Essential (primary) hypertension: Secondary | ICD-10-CM

## 2020-09-11 DIAGNOSIS — I5032 Chronic diastolic (congestive) heart failure: Secondary | ICD-10-CM

## 2020-09-14 ENCOUNTER — Other Ambulatory Visit: Payer: Self-pay | Admitting: Internal Medicine

## 2020-09-15 ENCOUNTER — Telehealth: Payer: Self-pay | Admitting: Pharmacist

## 2020-09-15 NOTE — Progress Notes (Addendum)
Chronic Care Management Pharmacy Assistant   Name: Bradley KOLENDA Sr.  MRN: [id number] DOB: [birth year]  Reason for Encounter: Initial Questions   PCP : [name], MD  Allergies:   Allergies  Allergen Reactions   Hydrocodone Other (See Comments)    "given to him in the hospital; went thru withdrawals once home; dr said not to take it again" (09/27/2012)   Tramadol Other (See Comments)    "given to him in the hospital; went thru withdrawals once home; dr said not to take it again" (09/27/2012)   Tadalafil Other (See Comments)    headache, backache   Avelox [Moxifloxacin Hydrochloride] Itching and Other (See Comments)    Headache    Medications: Outpatient Encounter Medications as of 09/15/2020  Medication Sig Note   amLODipine (NORVASC) 10 MG tablet Take 1 tablet (10 mg total) by mouth daily.    atorvastatin (LIPITOR) 20 MG tablet Take 1 tablet (20 mg total) by mouth at bedtime.    acetaminophen (TYLENOL) 500 MG tablet Take 500 mg by mouth every 6 (six) hours as needed for headache.    aspirin 81 MG tablet Take 81 mg by mouth every morning.     budesonide (PULMICORT) 0.5 MG/2ML nebulizer solution Take 2 mLs (0.5 mg total) by nebulization 2 (two) times daily.    calcium-vitamin D (OSCAL WITH D) 500-200 MG-UNIT tablet Take 1 tablet by mouth every morning.    carvedilol (COREG) 25 MG tablet Take 1 tablet (25 mg total) by mouth 2 (two) times daily with a meal.    diphenhydramine-acetaminophen (TYLENOL PM EXTRA STRENGTH) 25-500 MG TABS tablet Take 1 tablet by mouth at bedtime.    furosemide (LASIX) 40 MG tablet TAKE 2 TABLETS DAILY EXCEPT ON MONDAY, WEDNESDAY, AND FRIDAY TAKE 2 TABLETS IN THE MORNING A 1 TABLET IN THE AFTERNOON    ipratropium-albuterol (DUONEB) 0.5-2.5 (3) MG/3ML SOLN Take 3 mLs by nebulization 2 (two) times daily as needed.    Lancets (ONETOUCH ULTRASOFT) lancets Check blood sugars no more than twice daily    losartan (COZAAR) 100 MG tablet Take 0.5 tablets (50 mg  total) by mouth daily.    Multiple Vitamins-Minerals (MULTIVITAMINS THER. W/MINERALS) TABS tablet Take 1 tablet by mouth daily.    nitroGLYCERIN (NITROSTAT) 0.4 MG SL tablet Place 1 tablet (0.4 mg total) under the tongue every 5 (five) minutes x 3 doses as needed for chest pain. (Patient not taking: Reported on 08/11/2020) 11/26/2019: PRN   ONETOUCH VERIO test strip USE TO CHECK BLOOD SUGAR NO MORE THAN TWICE A DAY    pantoprazole (PROTONIX) 40 MG tablet Take 1 tablet (40 mg total) by mouth every morning.    sitaGLIPtin (JANUVIA) 100 MG tablet Take 1 tablet (100 mg total) by mouth daily.    tamsulosin (FLOMAX) 0.4 MG CAPS capsule Take 1 capsule (0.4 mg total) by mouth daily after supper.    No facility-administered encounter medications on file as of 09/15/2020.    Current Diagnosis: Patient Active Problem List   Diagnosis Date Noted   Cor pulmonale (chronic) (HCC) 08/05/2020   CAP (community acquired pneumonia) 09/13/2019   COPD with acute exacerbation (HCC) 12/22/2018   COPD GOLD 2/AB 12/22/2018   PCP NOTES >>> 07/15/2015   Annual physical exam 05/08/2012   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   Lymphadenopathy 09/23/2011   Sinoatrial node dysfunction (HCC)    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   Chronic diastolic (congestive) heart failure (HCC)    PAD (peripheral artery disease) (HCC) 10/22/2010  DERMATITIS, STASIS 09/14/2010   Chronic respiratory failure with hypoxia and hypercapnia (HCC) 09/14/2010   Chronic pulmonary hypertension secondary to elevated L H pressures  04/15/2009   ERECTILE DYSFUNCTION 01/06/2009   Hyperlipidemia 12/13/2007   Essential hypertension 12/13/2007   Anxiety--insomnia 08/14/2007   DM II (diabetes mellitus, type II), controlled (HCC) 01/02/2007   Permanent atrial fibrillation 01/02/2007   Benign enlargement of prostate 01/02/2007    Goals Addressed   None    Have you seen any other providers since your last visit? [name]  (pulmonologist)  Any changes in your medications or health? No  Any side effects from any medications? No  Do you have an symptoms or problems not managed by your medications? No  Any concerns about your health right now? No  Has your provider asked that you check blood pressure, blood sugar, or follow special diet at home? Patient states he checks his blood pressure and O2 levels every other day  Do you get any type of exercise on a regular basis? Walks when he can, but is on continual oxygen.   Can you think of a goal you would like to reach for your health? None  Do you have any problems getting your medications? No, uses [facility]  Is there anything that you would like to discuss during the appointment? No  Reminded the patient to please bring medications and supplements to appointment on Tuesday December 09-16-2020 at 11:00 am in the office.   Follow-Up:  Pharmacist Review   Corwin Levins, [facility] Clinical Pharmacist Assistant [phone]  Reviewed by: Katrinka Blazing, PharmD, BCACP Clinical Pharmacist [facility] [phone]

## 2020-09-16 ENCOUNTER — Ambulatory Visit: Payer: Medicare Other | Admitting: Pharmacist

## 2020-09-16 ENCOUNTER — Other Ambulatory Visit: Payer: Self-pay

## 2020-09-16 VITALS — BP 107/66 | HR 75 | Temp 97.7°F | Wt 202.0 lb

## 2020-09-16 DIAGNOSIS — I10 Essential (primary) hypertension: Secondary | ICD-10-CM

## 2020-09-16 DIAGNOSIS — E785 Hyperlipidemia, unspecified: Secondary | ICD-10-CM

## 2020-09-16 DIAGNOSIS — E1159 Type 2 diabetes mellitus with other circulatory complications: Secondary | ICD-10-CM

## 2020-09-16 NOTE — Chronic Care Management (AMB) (Signed)
Chronic Care Management Pharmacy  Name: Bradley [name].  MRN: [id number] DOB: 16-Sep-1941  Chief Complaint/ HPI  Bradley Jock [name].,  79 y.o. , male presents for their Initial CCM visit with the clinical pharmacist In office.  PCP : Colon Branch, MD  Their chronic conditions include: Hypertension, Hyperlipidemia/PAD/Cardiomyopathy, Diabetes, Afib, COPD, BPH, GERD  Office Visits: 08/11/20: Visit w/ Dr. Larose Kells - S/P COPD exacerbation. Pt back to baseline. Flu shot given. Encouraged pt to receive covid vaccine. No med changes noted.   07/28/20: Visit w/ Dr. Larose Kells - COPD exacerbation: prescribed doxycyline and prednisone. BP on low side. Hold losartan x2 doses and monitor BP. RTC 2 weeks  03/25/20: Visit w/ Dr. Larose Kells - No med changes noted.   Consult Visit: 08/04/20: Pulmonology visit w/ Dr. Melvyn Novas - Take budesonide and duoneb BID. Duoneb if needed in between. RTC 6 weeks for pfts.   03/18/20: Cardio visit w/ Dr. Rosalyn Gess - No med changes noted.  Medications: Outpatient Encounter Medications as of 09/16/2020  Medication Sig Note  . acetaminophen (TYLENOL) 500 MG tablet Take 500 mg by mouth every 6 (six) hours as needed for headache.   Marland Kitchen amLODipine (NORVASC) 10 MG tablet Take 1 tablet (10 mg total) by mouth daily.   Marland Kitchen aspirin 81 MG tablet Take 81 mg by mouth every morning.    Marland Kitchen atorvastatin (LIPITOR) 20 MG tablet Take 1 tablet (20 mg total) by mouth at bedtime.   . budesonide (PULMICORT) 0.5 MG/2ML nebulizer solution Take 2 mLs (0.5 mg total) by nebulization 2 (two) times daily.   . calcium-vitamin D (OSCAL WITH D) 500-200 MG-UNIT tablet Take 1 tablet by mouth every morning.   . carvedilol (COREG) 25 MG tablet Take 1 tablet (25 mg total) by mouth 2 (two) times daily with a meal.   . diphenhydramine-acetaminophen (TYLENOL PM EXTRA STRENGTH) 25-500 MG TABS tablet Take 1 tablet by mouth at bedtime.   . furosemide (LASIX) 40 MG tablet TAKE 2 TABLETS DAILY EXCEPT ON MONDAY, WEDNESDAY, AND FRIDAY TAKE 2  TABLETS IN THE MORNING A 1 TABLET IN THE AFTERNOON   . ipratropium-albuterol (DUONEB) 0.5-2.5 (3) MG/3ML SOLN Take 3 mLs by nebulization 2 (two) times daily as needed.   . Lancets (ONETOUCH ULTRASOFT) lancets Check blood sugars no more than twice daily   . losartan (COZAAR) 100 MG tablet Take 0.5 tablets (50 mg total) by mouth daily.   . Multiple Vitamins-Minerals (MULTIVITAMINS THER. W/MINERALS) TABS tablet Take 1 tablet by mouth daily.   . nitroGLYCERIN (NITROSTAT) 0.4 MG SL tablet Place 1 tablet (0.4 mg total) under the tongue every 5 (five) minutes x 3 doses as needed for chest pain. 11/26/2019: PRN  . ONETOUCH VERIO test strip USE TO CHECK BLOOD SUGAR NO MORE THAN TWICE A Bradley [name]   . pantoprazole (PROTONIX) 40 MG tablet Take 1 tablet (40 mg total) by mouth every morning.   . sitaGLIPtin (JANUVIA) 100 MG tablet Take 1 tablet (100 mg total) by mouth daily.   . tamsulosin (FLOMAX) 0.4 MG CAPS capsule Take 1 capsule (0.4 mg total) by mouth daily after supper.    No facility-administered encounter medications on file as of 09/16/2020.   SDOH Screenings   Alcohol Screen: Not on file  Depression (PHQ2-9): Low Risk   . PHQ-2 Score: 0  Financial Resource Strain: Low Risk   . Difficulty of Paying Living Expenses: Not hard at all  Food Insecurity: No Food Insecurity  . Worried About Charity fundraiser  in the Last Year: Never true  . Ran Out of Food in the Last Year: Never true  Housing: Low Risk   . Last Housing Risk Score: 0  Physical Activity: Not on file  Social Connections: Not on file  Stress: Not on file  Tobacco Use: Medium Risk  . Smoking Tobacco Use: Former Smoker  . Smokeless Tobacco Use: Never Used  Transportation Needs: No Transportation Needs  . Lack of Transportation (Medical): No  . Lack of Transportation (Non-Medical): No     Current Diagnosis/Assessment:  Goals Addressed            This Visit's Progress   . Chronic Care Management Pharmacy Care Plan       CARE PLAN  ENTRY (see longitudinal plan of care for additional care plan information)  Current Barriers:  . Chronic Disease Management support, education, and care coordination needs related to Hypertension, Hyperlipidemia/PAD/Cardiomyopathy, Diabetes, Afib, COPD, BPH, GERD   Hypertension BP Readings from Last 3 Encounters:  09/16/20 107/66  08/11/20 111/73  08/04/20 104/62   . Pharmacist Clinical Goal(s): o Over the next 90 days, patient will work with PharmD and providers to maintain BP goal <130/80 . Current regimen:  . Amlodipine 65m daily  . Losartan 1020m#1/2 tab (5086monce daily  . Carvedilol 18m53mice daily . Interventions: o Discussed possibility of patient getting losartan 50mg55m to reduce pill burden of splitting 100mg 34min half o Collaboration with provider regarding medication management (losartan 50mg t68mo replace losartan 100mg 1/51mb daily) . Patient self care activities - Over the next 90 days, patient will: o Check BP daily, document, and provide at future appointments o Ensure daily salt intake < 2300 mg/Jaiceon Collister o Start losartan 50mg dai28mending Dr. Paz approLarose Kells  Hyperlipidemia/PAD/Cardiomyopathy Lab Results  Component Value Date/Time   LDLCALC 8Hansell/2021 10:29 AM   . Pharmacist Clinical Goal(s): o Over the next 90 days, patient will work with PharmD and providers to achieve LDL goal < 70 . Current regimen:  . Atorvastatin 20mg dail3mAspirin 81mg daily41mTG 0.4mg as need106m. Patient self care activities - Over the next 90 days, patient will: o Maintain cholesterol medication regimen.   Diabetes Lab Results  Component Value Date/Time   HGBA1C 5.8 (H) 07/28/2020 10:26 AM   HGBA1C 6.9 (H) 03/25/2020 10:34 AM   . Pharmacist Clinical Goal(s): o Over the next 90 days, patient will work with PharmD and providers to maintain A1c goal <7% . Current regimen:  o Januvia 100mg daily .75merventions: o Discussed a1c goal and possibility of further  deprescribing pending future a1c results . Patient self care activities - Over the next 90 days, patient will: o Check blood sugar weekly, document, and provide at future appointments o Contact provider with any episodes of hypoglycemia  GERD . Pharmacist Clinical Goal(s) o Over the next 90 days, patient will work with PharmD and providers to reduce symptoms of GERD and reduce polypharmacy . Current regimen:  o Pantoprazole 40mg daily . 24mrventions: o Discussed the risk/benefit of continuation vs discontinuation of long term PPI use  o Collaboration with provider regarding medication management (pantoprazole 20mg to replac17mntoprazole 40mg) . Patient66mf care activities - Over the next 90 days, patient will: o Maintain GERD regimen  Medication management . Pharmacist Clinical Goal(s): o Over the next 90 days, patient will work with PharmD and providers to maintain optimal medication adherence . [facility]  o Comprehensive medication review performed. o Continue current medication management strategy . Patient self care activities - Over the next 90 days, patient will: o Focus on medication adherence by filling and taking medications appropriately  o Take medications as prescribed o Report any questions or concerns to PharmD and/or provider(s)  Initial goal documentation       Social Hx:  Originally from [place].  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  His wife has multiple health issues including hx of stroke and fall where she broke her hip and both ankles.  He has a grandson that lives in [place]. This grandson has a son.  His only son died due to drug overdose about 5 years ago. The patient found him unresponsive in his room on Christmas Eve.   Typical [place]: Watches TV Watches his great grand  daughter   Hypertension   BP goal is:  <130/80  Office blood pressures are  BP Readings from Last 3 Encounters:  09/18/20 110/68  09/16/20 107/66  08/11/20 111/73   Patient checks BP at home daily Patient home BP readings are ranging:  121/85 71 123/77 76 122/78 71 123/77 75  Patient has failed these meds in the past: None noted  Patient is currently controlled on the following medications:  . Amlodipine 5m daily  . Losartan 1062m#1/2 tab (5035monce daily  . Carvedilol 56m19mice daily  We discussed BP goal  Plan -Consider using losartan 50mg23mtab daily vs losartan 100mg 96m tab daily to reduce pill burden pending Dr. Paz apLarose Kellsval  Hyperlipidemia/PAD/Cardiomyopathy   LDL goal < 70  Last lipids Lab Results  Component Value Date   CHOL 144 11/26/2019   HDL 36.50 (L) 11/26/2019   LDLCALC 84 11/26/2019   TRIG 120.0 11/26/2019   CHOLHDL 4 11/26/2019   Hepatic Function Latest Ref Rng & Units 03/25/2020 09/25/2019 09/18/2019  Total Protein 6.0 - 8.3 g/dL 6.6 6.2 5.6(L)  Albumin 3.5 - 5.2 g/dL 3.8 3.7 2.8(L)  AST 0 - 37 U/L _0 ALT 0 - 53 U/L _1 Alk Phosphatase 39 - 117 U/L 68 64 59  Total Bilirubin 0.2 - 1.2 mg/dL 0.9 0.9 0.6  Bilirubin, Direct 0.0 - 0.3 mg/dL - - -     The 10-year ASCVD risk score (Goff Mikey Bussing., et al., 2013) is: 47.8%   Values used to calculate the score:     Age: 47 yea60     Sex: Male     Is Non-Hispanic African American: No     Diabetic: Yes     Tobacco smoker: No     Systolic Blood Pressure: 110 mm161    Is BP treated: Yes     HDL Cholesterol: 36.5 mg/dL     Total Cholesterol: 144 mg/dL   Patient has failed these meds in past: None noted  Patient is currently uncontrolled on the following medications:  . Atorvastatin 20mg d79m . Aspirin 81mg da53m. NTG 0.4mg as n61med (last used when his son died)  Plan -Continue current medications (will assess next lipid panel if titration warranted)  Diabetes   A1c goal  <7%  Recent Relevant Labs: Lab Results  Component Value Date/Time   HGBA1C 5.8 (H) 07/28/2020 10:26 AM   HGBA1C 6.9 (H) 03/25/2020 10:34 AM   GFR 94.59 03/25/2020 10:34 AM   GFR  96.05 12/17/2019 09:14 AM   MICROALBUR 1.6 12/14/2010 10:06 AM   MICROALBUR 3.0 (H) 07/11/2009 09:18 AM    Last diabetic Eye exam:  Lab Results  Component Value Date/Time   HMDIABEYEEXA No Retinopathy 05/06/2020 12:00 AM    Last diabetic Foot exam:  Lab Results  Component Value Date/Time   HMDIABFOOTEX Done 07/15/2015 12:00 AM     Checking BG: Weekly (1-2 times per week)  Recent FBG Readings: No specific readings to provide  Patient has failed these meds in past: metformin (heart failure?), glyburide (D/C to deprescribe per a1c) Patient is currently controlled on the following medications: . Januvia 100mg  daily  Pt states he does not eat fried foods.  His grand daughter that lives with them prepares all meals.  His appetite has lessened to where he eats half the portion sizes as he previously did.  He reports in the past he could eat a whole pound of macaroni by himself, now when he prepares 1/2 lbs of macaroni it is enough for him and his wife plus left overs.   We discussed: A1c goal and possibility of further deprescribing pending future a1c results  Plan -Continue current medications   Future Plan -If a1c <6.5% for 1 year can consider D/C Januvia and manage with diet and exercise  AFIB   Patient is currently pacemaker controlled.  Patient has failed these meds in past: coumadin (intracranial bleed in 2013) Patient is currently controlled on the following medications:   Carvedilol 25mg  twice daily  Denies any symptoms  Plan -Continue current medications   COPD / Tobacco   Last spirometry score:  10/18/2014 FEV1 1.71 (59%) with ratio 68 and no sign change p B2 and DLCO 53% corrects to 85  Scheduled for repeat PFTs on 09/18/20   Gold Grade: Gold 2 (FEV1 50-79%) Current COPD  Classification:  B (high sx, <2 exacerbations/yr)  Eosinophil count:   Lab Results  Component Value Date/Time   EOSPCT 1.3 07/28/2020 10:26 AM  %                               Eos (Absolute):  Lab Results  Component Value Date/Time   EOSABS 77 07/28/2020 10:26 AM    Tobacco Status:  Social History   Tobacco Use  Smoking Status Former Smoker  . Packs/Tonishia Steffy: 2.50  . Years: 25.00  . Pack years: 62.50  . Types: Cigarettes, Cigars  . Quit date: 10/11/1980  . Years since quitting: 39.9  Smokeless Tobacco Never Used    Patient has failed these meds in past: None noted  Patient is currently controlled on the following medications:  Pulmicort (Budesonide) 0.5mg /82mL nebulized twice daily  DuoNeb (Ipratropium/Albuterol) 0.5/2.5/77mL nebulized twice daily  Only becomes short of breath with exertion.  Uses portable oxygen when outside the home.  Uses continuous O2 at home.  States he uses pulmicort and then duoneb. Considering mechanism of action, recommend patient to use duoneb first to open lungs allowing deeper penetration of pulmicort.  Pt verbalized understanding and agreed.   Plan -Use ipratropium/albuterol prior to using pulmicort -Continue current medications   BPH   PSA  Date Value Ref Range Status  07/15/2015 3.60 0.10 - 4.00 ng/ml Final  01/14/2014 3.50 0.10 - 4.00 ng/mL Final  12/14/2010 1.79 0.10 - 4.00 ng/mL Final     Patient has failed these meds in past: None noted Patient is currently controlled on the following medications:  .  Tamsulosin 0.28m daily  Overnight urination: 2-3 times Bladder emptying: yes Stream vs dribble: stream  Plan -Continue current medications   GERD   Patient has failed these meds in past: None noted  Patient is currently controlled on the following medications:  . Pantoprazole 489mdaily  Breakthrough Sx: None Breakthrough Tx:  None Triggers: None  We discussed:  the risk/benefit of continuation vs discontinuation of  long term PPI use   Plan -Consider decreasing pantoprazole to 2018maily pending Dr. PazLarose Kellsproval  Future Plan -Consider PPI tapering plan if tolerated  Vaccines   Reviewed and discussed patient's vaccination history.   Need to discuss at next visit  Immunization History  Administered Date(s) Administered  . Fluad Quad(high Dose 65+) 06/26/2019, 08/11/2020  . Influenza Split 10/01/2011  . Influenza Whole 08/14/2007, 07/11/2009, 09/14/2010  . Influenza, High Dose Seasonal PF 09/14/2013, 07/15/2015, 07/09/2016, 07/05/2017, 08/03/2018  . Influenza,inj,Quad PF,6+ Mos 07/05/2014  . Influenza-Unspecified 08/11/2012  . Janssen (J&J) SARS-COV-2 Vaccination 01/12/2020  . Pneumococcal Conjugate-13 03/12/2015  . Pneumococcal Polysaccharide-23 07/11/2004, 03/30/2009, 11/01/2017, 05/11/2019  . Td 03/12/2003  . Tetanus 09/14/2013  . Zoster 04/20/2011    Plan -Recommend patient receive Shingrix vaccine in pharmacy.   Medication Management   [id number] [place] [facility] [address] [id number]: [phone]: [phone] [place]: [phone]: [phone] pill box? Yes Pt endorses 100% compliance   Miscellaneous Meds Tylenol 500m31mrn headaches) Tylenol PM (daily for sleep) Calcium/Vitamin D 500mg9m units Multivitamin  We discussed: Current pharmacy is preferred with insurance plan and patient is satisfied with pharmacy services  Plan  Continue current medication management strategy  Follow up: 3 month phone visit  KanesDe BlanchrmD, BCACP Clinical Pharmacist [phone]

## 2020-09-17 ENCOUNTER — Other Ambulatory Visit: Payer: Self-pay | Admitting: *Deleted

## 2020-09-17 DIAGNOSIS — J9612 Chronic respiratory failure with hypercapnia: Secondary | ICD-10-CM

## 2020-09-17 DIAGNOSIS — J9611 Chronic respiratory failure with hypoxia: Secondary | ICD-10-CM

## 2020-09-18 ENCOUNTER — Encounter: Payer: Self-pay | Admitting: Internal Medicine

## 2020-09-18 ENCOUNTER — Ambulatory Visit (INDEPENDENT_AMBULATORY_CARE_PROVIDER_SITE_OTHER): Payer: Medicare Other | Admitting: Internal Medicine

## 2020-09-18 ENCOUNTER — Other Ambulatory Visit: Payer: Self-pay

## 2020-09-18 ENCOUNTER — Other Ambulatory Visit: Payer: Self-pay | Admitting: Pulmonary Disease

## 2020-09-18 DIAGNOSIS — J9611 Chronic respiratory failure with hypoxia: Secondary | ICD-10-CM

## 2020-09-18 DIAGNOSIS — I2781 Cor pulmonale (chronic): Secondary | ICD-10-CM

## 2020-09-18 DIAGNOSIS — J9612 Chronic respiratory failure with hypercapnia: Secondary | ICD-10-CM

## 2020-09-18 DIAGNOSIS — J449 Chronic obstructive pulmonary disease, unspecified: Secondary | ICD-10-CM | POA: Diagnosis not present

## 2020-09-18 LAB — PULMONARY FUNCTION TEST
DL/VA % pred: 119 %
DL/VA: 4.71 ml/min/mmHg/L
DLCO cor % pred: 50 %
DLCO cor: 11.58 ml/min/mmHg
DLCO unc % pred: 50 %
DLCO unc: 11.58 ml/min/mmHg
FEF 25-75 Post: 1.14 L/sec
FEF 25-75 Pre: 0.95 L/sec
FEF2575-%Change-Post: 19 %
FEF2575-%Pred-Post: 61 %
FEF2575-%Pred-Pre: 51 %
FEV1-%Change-Post: 6 %
FEV1-%Pred-Post: 53 %
FEV1-%Pred-Pre: 50 %
FEV1-Post: 1.41 L
FEV1-Pre: 1.33 L
FEV1FVC-%Change-Post: 3 %
FEV1FVC-%Pred-Pre: 101 %
FEV6-%Change-Post: 2 %
FEV6-%Pred-Post: 54 %
FEV6-%Pred-Pre: 52 %
FEV6-Post: 1.89 L
FEV6-Pre: 1.83 L
FEV6FVC-%Pred-Post: 107 %
FEV6FVC-%Pred-Pre: 107 %
FVC-%Change-Post: 2 %
FVC-%Pred-Post: 50 %
FVC-%Pred-Pre: 49 %
FVC-Post: 1.89 L
FVC-Pre: 1.83 L
Post FEV1/FVC ratio: 75 %
Post FEV6/FVC ratio: 100 %
Pre FEV1/FVC ratio: 73 %
Pre FEV6/FVC Ratio: 100 %
RV % pred: 117 %
RV: 2.96 L
TLC % pred: 72 %
TLC: 4.82 L

## 2020-09-18 NOTE — Assessment & Plan Note (Addendum)
Started on 02 2lpm at discharge 09/23/11   - PFT's 10/28/2011  FEV1  1.40 (51%)  with ratio 70 and no better p B2 and DLCO 53 corrects to 101    - PFT's 10/18/2014    FEV1 1.71 (59%) with ratio 68 and no sign change p B2 and DLCO 53 corrects to 85  -  HC03   07/28/20  = 33   Again advised:  Make sure you check your oxygen saturation  at highest level of activity to be sure it stays over 90% and adjust  02 flow upward to maintain this level if needed but remember to turn it back to previous settings when you stop (to conserve your supply).    F/u can be q 6 m, call sooner if losing ground with symptoms or ex sats          Each maintenance medication was reviewed in detail including emphasizing most importantly the difference between maintenance and prns and under what circumstances the prns are to be triggered using an action plan format where appropriate.  Total time for H and P, chart review, counseling, teaching re approp use of  devices and generating customized AVS unique to this office visit / charting = 34 min

## 2020-09-18 NOTE — Assessment & Plan Note (Signed)
See echo 09/23/18  - Echo 09/03/20  1. Left ventricular ejection fraction, by estimation, is 55 to 60%. The left ventricle has normal function. The left ventricle has no regional wall motion abnormalities. There is mild left ventricular hypertrophy. Left ventricular diastolic parameters  are indeterminate.  2. Right ventricular systolic function is normal. The right ventricular size is moderately enlarged. There is moderately elevated pulmonary artery systolic pressure.  3. Left atrial size was mildly dilated.  4. Right atrial size was severely dilated.  5. The mitral valve is normal in structure. Trivial mitral valve regurgitation.  6. Tricuspid valve regurgitation is moderate.  7. The aortic valve is tricuspid. Aortic valve regurgitation is not visualized. Mild to moderate aortic valve sclerosis/calcification is present, without any evidence of aortic stenosis.  C/w WHO III PH and rx is to treat the underlying problem and keep sats > 90% at all times/ reviewed

## 2020-09-18 NOTE — Assessment & Plan Note (Signed)
Quit smoking 1982 age 79  - PFT's 10/18/2014    FEV1 1.71 (59%) with ratio 68 and no sign change p B2 and DLCO 53% corrects to 85  - PFT's  09/18/2020  FEV1 1.41 (53 % ) ratio 0.75  p 6 % improvement from saba p 0 prior to study with DLCO  11.58 (50%) corrects to 4.71 (118%)  for alv volume and FV curve mildly concave   He does not meet the criteria for copd but doing better on rx for AB so no need to change to more aggressive copd rx

## 2020-09-18 NOTE — Progress Notes (Signed)
Full PFT performed today. °

## 2020-09-18 NOTE — Progress Notes (Signed)
Subjective:    Patient ID: Bradley [name], male    DOB: [birth year]     MRN: [id number]   Brief patient profile:  41 yowm  From [place] quit smoking in 1982 prev pulm  eval in 2008 with pleural effusions but all better with no resp issues until Aug 2012 when dx with bronchitis and "downhill since" so referred by Dr Drue Novel back to pulmonary clinic in Nov 2012 and ultimately proven to have no sign copd by pft's 10/28/2011 and sob attributed to chf.   History of Present Illness  08/27/2011 1st pulmonary eval in [facility] cc new onset cough indolent persistent, daily x 3 months,  typically p supper with minimal yellow mucus  Assoc with new sob x sev flights and stop at top of 3 rd -  50% better better p rx with pna no better with albuterol.  No overt sinus or reflux symptoms rec Prednisone 10 mg take  4 each am x 2 days,   2 each am x 2 days,  1 each am x2days and stop  Change lopressor to 50mg  one half in am and one half in pm Protonix 30-60 min before bfast and Pepcid 20mg  one at bedtime  GERD diet   Admit date: 09/20/2011  Discharge date: 09/23/2011  Discharge Diagnoses  Systolic CHF, acute on chronic  Permanent atrial fibrillation  Hypokalemia  Lymphadenopathy  09/24/2011 Deneene Tarver cc Hospital follow-up. Was hospitalized for trouble breathing. Patient states he is better since getting out of the hospital.  rec Only use your albuterol (proventil or ventolin)  as a rescue medication to be used if you can't catch your breath > not using  Keep on 02 24 hours per day @ 2lpm     10/28/2011 f/u ov/Danyla Wattley cc Shortness of Breath Improved since last visit on 02 and with rx of chf, no cough or daytime need for saba - on 02 can walk flat and slow as much as desired rec Stay on 02 24 hours per day - this will help your heart You do not have significant lung problems other than what is related to the congestive heart failure so pulmonary follow up will be as needed     Date of Admission: 08/22/2014 Date of  Discharge: 08/25/2014  Attending Physician:MCCLUNG,JEFFREY T  Patient's 13/09/2014 08/27/2014, MD  Consults: PCCM  Disposition: D/C home   Discharge Diagnoses: Acute on chronic resp failure with hypoxia SSCP Hx of COPD  Chronic combined diastolic and systolic CHF  Hx afib s/p ablation w/ pacer Nov 2012 HTN HLD DM2  Initial presentation: 79 y.o. male with a hx of HTN, HLD, and systolic and diastolic chf who presented from outpatient clinic after failing tx for bronchitis with doxycycline. Pt reportedly progressively became more dyspneic and sob. A cxr was found to be without evidence of PNA or pulm edema. He was noted to have O2 sats in the mid-80's on O2. The patient was then directly admitted to stepdown.   Hospital Course:  Acute on chronic resp failure with hypoxia Felt to be due to AECOPD - tapered steroids to oral pred from IV solumedrol - weaned O2 to home dose of 2L w/ sats 90% or > - cleared for d/c home w/ completion of steroid taper and oral abx tx - spiriva and albuterol per PCCM recs  SSCP Likely situational stress related (family argument), but pt high risk for CAD - EKGs unrevealing - cycled enzymes all negative - sx resolved at time of d/c  Hx of COPD  Has not smoked in 30 years, but was a "very heavy" smoker - on 2LPM O2 at all times at home   Chronic combined diastolic and systolic CHF  EF 29-51% via TTE Sept 2013 - repeat TTE notes improvement in EF - no clinical signs of CHF exac  Hx afib s/p ablation w/ pacer Nov 2012 Followed by [name] EP  HTN BP well controlled at present   HLD Resume advicor   DM2 CBG uncontrolled in setting of steroid use - taper steroids quickly and resume usual tx at home post d/c   09/06/2014 post hosp f/u/ transition of care ov / re-establish care / /Tareva Leske / newly started on spiriva despite nl baseline pfts  Chief Complaint  Patient presents with  . Hospitalization Follow-up    Feeling well since leaving the hospital.  Reports coughing from time to time, when he does it came be dry but at times it can be productive. Denies chest tightness, SOB or wheezing.  breathing much better now= back to baseline, ok x 3 flights/ uses 2lpm when walking dog and sometimes shopping    At baseline before deterioration rarely needed any albuterol at all ? How compliant he was with prev 02 recs Main c/o is day > noct cough dry > wet still present and at times quite severe  rec Stop spiriva as you have no copd  Only use your albuterol (proair)  Pantoprazole (protonix) 40 mg   Take 30-60 min before first meal of the day and Pepcid ac  20 mg one bedtime until return to office - this is the best way to tell whether stomach acid is contributing to your problem.  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 Follow-up    PFT done today. Breathing is "excellent".  No co's today.     no need for any gerd rx or inhalers x one month  rec In the event of a recurrent cough please Try prilosec 20mg   Take 30-60 min before first meal of the day and Pepcid 20 mg one bedtime until cough is completely gone    08/04/2020  Re-establish  ov/Sanjiv Castorena re: copd 0 vs mild intermittent asthma on duoneb /pulmicort but not clear how / when takes this or any other med  Chief Complaint  Patient presents with  . Follow-up    DOE  Dyspnea:  MMRC2 = can't walk a nl pace on a flat grade s sob but does fine slow and flat  Cough: slt yellow / more as day day goes on Sleeping:  Flat bed / one pillow  SABA use: ? Exactly how much  02: started 2012 but for the last 2 years has been on 3-4 lpm with sats mid 90s  rec Take both budesonide and duoneb twice daily and in between just the duoneb if you feel like you need it Please schedule a follow up office visit in 6 weeks,  with pfts and bring all medications and solutions  Late Add: Needs echo > c/w cor pulmonale consider for HRCT and  change coreg to bisoprolol ?     09/18/2020  f/u ov/Guneet Delpino re: GOLD 0  AB/ cor pulmonale on neb bid  Chief Complaint  Patient presents with  . Follow-up    Breathing is overall doing well. He had PFT done today. No new co's. He 14/06/2020 using his Duoneb twice daily.  Dyspnea:  Shopping / up steps/ mailbox is flat 100 ft / not checking sats  Cough: none  Sleeping: flat bed/ one pillows  SABA use: never extra  02: sleeps on 2lpm /   2-3 POC     No obvious day to day or daytime variability or assoc excess/ purulent sputum or mucus plugs or hemoptysis or cp or chest tightness, subjective wheeze or overt sinus or hb symptoms.   Sleeping  without nocturnal  or early am exacerbation  of respiratory  c/o's or need for noct saba. Also denies any obvious fluctuation of symptoms with weather or environmental changes or other aggravating or alleviating factors except as outlined above   No unusual exposure hx or h/o childhood pna/ asthma or knowledge of premature birth.  Current Allergies, Complete Past Medical History, Past Surgical History, Family History, and Social History were reviewed in [employer] record.  ROS  The following are not active complaints unless bolded Hoarseness, sore throat, dysphagia, dental problems, itching, sneezing,  nasal congestion or discharge of excess mucus or purulent secretions, ear ache,   fever, chills, sweats, unintended wt loss or wt gain, classically pleuritic or exertional cp,  orthopnea pnd or arm/hand swelling  or leg swelling, presyncope, palpitations, abdominal pain, anorexia, nausea, vomiting, diarrhea  or change in bowel habits or change in bladder habits, change in stools or change in urine, dysuria, hematuria,  rash, arthralgias, visual complaints, headache, numbness, weakness or ataxia or problems with walking or coordination,  change in mood or  memory.        Current Meds  Medication Sig  . acetaminophen (TYLENOL) 500 MG tablet Take  500 mg by mouth every 6 (six) hours as needed for headache.  Marland Kitchen amLODipine (NORVASC) 10 MG tablet Take 1 tablet (10 mg total) by mouth daily.  Marland Kitchen aspirin 81 MG tablet Take 81 mg by mouth every morning.   Marland Kitchen atorvastatin (LIPITOR) 20 MG tablet Take 1 tablet (20 mg total) by mouth at bedtime.  . budesonide (PULMICORT) 0.5 MG/2ML nebulizer solution Take 2 mLs (0.5 mg total) by nebulization 2 (two) times daily.  . calcium-vitamin D (OSCAL WITH D) 500-200 MG-UNIT tablet Take 1 tablet by mouth every morning.  . carvedilol (COREG) 25 MG tablet Take 1 tablet (25 mg total) by mouth 2 (two) times daily with a meal.  . diphenhydramine-acetaminophen (TYLENOL PM EXTRA STRENGTH) 25-500 MG TABS tablet Take 1 tablet by mouth at bedtime.  . furosemide (LASIX) 40 MG tablet TAKE 2 TABLETS DAILY EXCEPT ON MONDAY, WEDNESDAY, AND FRIDAY TAKE 2 TABLETS IN THE MORNING A 1 TABLET IN THE AFTERNOON  . ipratropium-albuterol (DUONEB) 0.5-2.5 (3) MG/3ML SOLN Take 3 mLs by nebulization 2 (two) times daily as needed.  . Lancets (ONETOUCH ULTRASOFT) lancets Check blood sugars no more than twice daily  . losartan (COZAAR) 100 MG tablet Take 0.5 tablets (50 mg total) by mouth daily.  . Multiple Vitamins-Minerals (MULTIVITAMINS THER. W/MINERALS) TABS tablet Take 1 tablet by mouth daily.  . nitroGLYCERIN (NITROSTAT) 0.4 MG SL tablet Place 1 tablet (0.4 mg total) under the tongue every 5 (five) minutes x 3 doses as needed for chest pain.  Letta Pate VERIO test strip USE TO CHECK BLOOD SUGAR NO MORE THAN TWICE A DAY  . pantoprazole (PROTONIX) 40 MG tablet Take 1 tablet (40 mg total) by mouth every morning.  . sitaGLIPtin (JANUVIA) 100 MG tablet Take 1 tablet (100 mg total) by mouth daily.  . tamsulosin (FLOMAX) 0.4 MG  CAPS capsule Take 1 capsule (0.4 mg total) by mouth daily after supper.                       Objective:   Physical Exam  09/18/2020        195  08/04/20 195 lb 9.6 oz (88.7 kg)  07/28/20 205 lb 4 oz (93.1 kg)   03/25/20 208 lb 2 oz (94.4 kg)  08/27/2011     217     Pleasant elderly wm nad   Edentulous      HEENT : pt wearing mask not removed for exam due to covid - 19 concerns.   NECK :  without JVD/Nodes/TM/ nl carotid upstrokes bilaterally   LUNGS: no acc muscle use,  Min barrel  contour chest wall with bilateral  slightly decreased bs s audible wheeze and  without cough on insp or exp maneuvers and min  Hyperresonant  to  percussion bilaterally     CV:  RRR  no s3 or murmur or increase in P2, and no edema   ABD:  soft and nontender with pos end  insp Hoover's  in the supine position. No bruits or organomegaly appreciated, bowel sounds nl  MS:   Nl gait/  ext warm without deformities, calf tenderness, cyanosis or clubbing No obvious joint restrictions   SKIN: warm and dry without lesions    NEURO:  alert, approp, nl sensorium with  no motor or cerebellar deficits apparent.               Assessment & Plan:

## 2020-09-18 NOTE — Patient Instructions (Signed)
Budesonide and duoneb twice daily and in between just the duoneb if you feel like you need it especially before heavy exertion   Make sure you check your oxygen saturation  at highest level of activity to be sure it stays over 90% and adjust  02 flow upward to maintain this level if needed but remember to turn it back to previous settings when you stop (to conserve your supply).    Please schedule a follow up visit in 6 months but call sooner if needed

## 2020-09-19 NOTE — Patient Instructions (Addendum)
Visit Information  Goals Addressed            This Visit's Progress   . Chronic Care Management Pharmacy Care Plan       CARE PLAN ENTRY (see longitudinal plan of care for additional care plan information)  Current Barriers:  . Chronic Disease Management support, education, and care coordination needs related to Hypertension, Hyperlipidemia/PAD/Cardiomyopathy, Diabetes, Afib, COPD, BPH, GERD   Hypertension BP Readings from Last 3 Encounters:  09/16/20 107/66  08/11/20 111/73  08/04/20 104/62   . Pharmacist Clinical Goal(s): o Over the next 90 days, patient will work with PharmD and providers to maintain BP goal <130/80 . Current regimen:  . Amlodipine 10mg  daily  . Losartan 100mg  #1/2 tab (50mg ) once daily  . Carvedilol 25mg  twice daily . Interventions: o Discussed possibility of patient getting losartan 50mg  tab to reduce pill burden of splitting 100mg  tab in half o Collaboration with provider regarding medication management (losartan 50mg  tab to replace losartan 100mg  1/2 tab daily) . Patient self care activities - Over the next 90 days, patient will: o Check BP daily, document, and provide at future appointments o Ensure daily salt intake < 2300 mg/Bradley [name] o Start losartan 50mg  daily pending Dr. approval  Hyperlipidemia/PAD/Cardiomyopathy Lab Results  Component Value Date/Time   LDLCALC 84 11/26/2019 10:29 AM   . Pharmacist Clinical Goal(s): o Over the next 90 days, patient will work with PharmD and providers to achieve LDL goal < 70 . Current regimen:  . Atorvastatin 20mg  daily . Aspirin 81mg  daily . NTG 0.4mg  as needed . Patient self care activities - Over the next 90 days, patient will: o Maintain cholesterol medication regimen.   Diabetes Lab Results  Component Value Date/Time   HGBA1C 5.8 (H) 07/28/2020 10:26 AM   HGBA1C 6.9 (H) 03/25/2020 10:34 AM   . Pharmacist Clinical Goal(s): o Over the next 90 days, patient will work with PharmD and providers to  maintain A1c goal <7% . Current regimen:  o Januvia 100mg  daily . Interventions: o Discussed a1c goal and possibility of further deprescribing pending future a1c results . Patient self care activities - Over the next 90 days, patient will: o Check blood sugar weekly, document, and provide at future appointments o Contact provider with any episodes of hypoglycemia  GERD . Pharmacist Clinical Goal(s) o Over the next 90 days, patient will work with PharmD and providers to reduce symptoms of GERD and reduce polypharmacy . Current regimen:  o Pantoprazole 40mg  daily . Interventions: o Discussed the risk/benefit of continuation vs discontinuation of long term PPI use  o Collaboration with provider regarding medication management (pantoprazole 20mg  to replace pantoprazole 40mg ) . Patient self care activities - Over the next 90 days, patient will: o Maintain GERD regimen  Medication management . Pharmacist Clinical Goal(s): o Over the next 90 days, patient will work with PharmD and providers to maintain optimal medication adherence . [facility] . Interventions o Comprehensive medication review performed. o Continue current medication management strategy . Patient self care activities - Over the next 90 days, patient will: o Focus on medication adherence by filling and taking medications appropriately  o Take medications as prescribed o Report any questions or concerns to PharmD and/or provider(s)  Initial goal documentation        Bradley [name] was given information about Chronic Care Management services today including:  1. CCM service includes personalized support from designated clinical staff supervised by his physician, including individualized  plan of care and coordination with other care providers 2. 24/7 contact phone numbers for assistance for urgent and routine care needs. 3. Standard insurance, coinsurance, copays and deductibles  apply for chronic care management only during months in which we provide at least 20 minutes of these services. Most insurances cover these services at 100%, however patients may be responsible for any copay, coinsurance and/or deductible if applicable. This service may help you avoid the need for more expensive face-to-face services. 4. Only one practitioner may furnish and bill the service in a calendar month. 5. The patient may stop CCM services at any time (effective at the end of the month) by phone call to the office staff.  Patient agreed to services and verbal consent obtained.   The patient verbalized understanding of instructions, educational materials, and care plan provided today and agreed to receive a mailed copy of patient instructions, educational materials, and care plan.  Telephone follow up appointment with pharmacy team member scheduled for: 12/15/2020  Dannielle Karvonen Micki Cassel, [facility]    How to Take Your Blood Pressure You can take your blood pressure at home with a machine. You may need to check your blood pressure at home:  To check if you have high blood pressure (hypertension).  To check your blood pressure over time.  To make sure your blood pressure medicine is working. Supplies needed: You will need a blood pressure machine, or monitor. You can buy one at a drugstore or online. When choosing one:  Choose one with an arm cuff.  Choose one that wraps around your upper arm. Only one finger should fit between your arm and the cuff.  Do not choose one that measures your blood pressure from your wrist or finger. Your doctor can suggest a monitor. How to prepare Avoid these things for 30 minutes before checking your blood pressure:  Drinking caffeine.  Drinking alcohol.  Eating.  Smoking.  Exercising. Five minutes before checking your blood pressure:  Pee.  Sit in a dining chair. Avoid sitting in a soft couch or armchair.  Be quiet. Do not talk. How to take your  blood pressure Follow the instructions that came with your machine. If you have a digital blood pressure monitor, these may be the instructions: 1. Sit up straight. 2. Place your feet on the floor. Do not cross your ankles or legs. 3. Rest your left arm at the level of your heart. You may rest it on a table, desk, or chair. 4. Pull up your shirt sleeve. 5. Wrap the blood pressure cuff around the upper part of your left arm. The cuff should be 1 inch (2.5 cm) above your elbow. It is best to wrap the cuff around bare skin. 6. Fit the cuff snugly around your arm. You should be able to place only one finger between the cuff and your arm. 7. Put the cord inside the groove of your elbow. 8. Press the power button. 9. Sit quietly while the cuff fills with air and loses air. 10. Write down the numbers on the screen. 11. Wait 2-3 minutes and then repeat steps 1-10. What do the numbers mean? Two numbers make up your blood pressure. The first number is called systolic pressure. The second is called diastolic pressure. An example of a blood pressure reading is "120 over 80" (or 120/80). If you are an adult and do not have a medical condition, use this guide to find out if your blood pressure is normal: Normal  First number:  below 120.  Second number: below 80. Elevated  First number: 120-129.  Second number: below 80. Hypertension stage 1  First number: 130-139.  Second number: 80-89. Hypertension stage 2  First number: 140 or above.  Second number: 90 or above. Your blood pressure is above normal even if only the top or bottom number is above normal. Follow these instructions at home:  Check your blood pressure as often as your doctor tells you to.  Take your monitor to your next doctor's appointment. Your doctor will: ? Make sure you are using it correctly. ? Make sure it is working right.  Make sure you understand what your blood pressure numbers should be.  Tell your doctor if  your medicines are causing side effects. Contact a doctor if:  Your blood pressure keeps being high. Get help right away if:  Your first blood pressure number is higher than 180.  Your second blood pressure number is higher than 120. This information is not intended to replace advice given to you by your health care provider. Make sure you discuss any questions you have with your health care provider. Document Revised: 09/09/2017 Document Reviewed: 03/05/2016 Elsevier Patient Education  2020 [employer].

## 2020-09-22 MED ORDER — PANTOPRAZOLE SODIUM 20 MG PO TBEC: 20.0000 mg | DELAYED_RELEASE_TABLET | Freq: Every day | ORAL | 1 refills | Status: DC

## 2020-09-22 MED ORDER — LOSARTAN POTASSIUM 50 MG PO TABS: 50.0000 mg | ORAL_TABLET | Freq: Every day | ORAL | 1 refills | Status: DC

## 2020-09-22 NOTE — Addendum Note (Signed)
Addended byConrad Bensenville D on: 09/22/2020 09:06 AM   Modules accepted: Orders

## 2020-11-24 ENCOUNTER — Other Ambulatory Visit: Payer: Self-pay | Admitting: Internal Medicine

## 2020-11-25 ENCOUNTER — Ambulatory Visit (INDEPENDENT_AMBULATORY_CARE_PROVIDER_SITE_OTHER): Payer: Medicare Other

## 2020-11-25 DIAGNOSIS — I428 Other cardiomyopathies: Secondary | ICD-10-CM

## 2020-11-25 LAB — CUP PACEART REMOTE DEVICE CHECK
Battery Remaining Longevity: 20 mo
Battery Voltage: 2.9 V
Brady Statistic AP VP Percent: 0 %
Brady Statistic AP VS Percent: 0 %
Brady Statistic AS VP Percent: 98.86 %
Brady Statistic AS VS Percent: 1.14 %
Brady Statistic RA Percent Paced: 0 %
Brady Statistic RV Percent Paced: 99.3 %
Date Time Interrogation Session: 20220215143241
Implantable Lead Implant Date: 20121128
Implantable Lead Implant Date: 20121128
Implantable Lead Location: 753858
Implantable Lead Location: 753860
Implantable Lead Model: 4194
Implantable Lead Model: 5076
Implantable Pulse Generator Implant Date: 20121128
Lead Channel Impedance Value: 361 Ohm
Lead Channel Impedance Value: 399 Ohm
Lead Channel Impedance Value: 4047 Ohm
Lead Channel Impedance Value: 4047 Ohm
Lead Channel Impedance Value: 456 Ohm
Lead Channel Impedance Value: 456 Ohm
Lead Channel Impedance Value: 475 Ohm
Lead Channel Impedance Value: 570 Ohm
Lead Channel Impedance Value: 665 Ohm
Lead Channel Pacing Threshold Amplitude: 0.75 V
Lead Channel Pacing Threshold Amplitude: 1.25 V
Lead Channel Pacing Threshold Pulse Width: 0.4 ms
Lead Channel Pacing Threshold Pulse Width: 0.4 ms
Lead Channel Sensing Intrinsic Amplitude: 12.75 mV
Lead Channel Sensing Intrinsic Amplitude: 12.75 mV
Lead Channel Setting Pacing Amplitude: 2 V
Lead Channel Setting Pacing Amplitude: 2.25 V
Lead Channel Setting Pacing Pulse Width: 0.4 ms
Lead Channel Setting Pacing Pulse Width: 0.4 ms
Lead Channel Setting Sensing Sensitivity: 4 mV

## 2020-11-28 ENCOUNTER — Ambulatory Visit (INDEPENDENT_AMBULATORY_CARE_PROVIDER_SITE_OTHER): Payer: Medicare Other | Admitting: Cardiovascular Disease

## 2020-11-28 ENCOUNTER — Encounter: Payer: Self-pay | Admitting: Cardiovascular Disease

## 2020-11-28 ENCOUNTER — Other Ambulatory Visit: Payer: Self-pay

## 2020-11-28 VITALS — BP 100/56 | HR 81 | Ht 69.0 in | Wt 190.0 lb

## 2020-11-28 DIAGNOSIS — I5032 Chronic diastolic (congestive) heart failure: Secondary | ICD-10-CM

## 2020-11-28 DIAGNOSIS — I110 Hypertensive heart disease with heart failure: Secondary | ICD-10-CM | POA: Diagnosis not present

## 2020-11-28 DIAGNOSIS — I10 Essential (primary) hypertension: Secondary | ICD-10-CM

## 2020-11-28 DIAGNOSIS — I739 Peripheral vascular disease, unspecified: Secondary | ICD-10-CM

## 2020-11-28 DIAGNOSIS — I2781 Cor pulmonale (chronic): Secondary | ICD-10-CM | POA: Diagnosis not present

## 2020-11-28 DIAGNOSIS — I4811 Longstanding persistent atrial fibrillation: Secondary | ICD-10-CM

## 2020-11-28 NOTE — Progress Notes (Signed)
Cardiology Office Note   Date:  11/28/2020   ID:  [name], DOB 05/31/1941, MRN [id number]  PCP:  [name], MD  Cardiologist:   [name], MD   No chief complaint on file.    History of Present Illness: SUKHRAJ ESQUIVIAS Sr. is a 80 y.o. male with chronic atrial fibrillation s/p AVN ablation and CRT-P, chronic diastolic heart failure, mild aortic stenosis, CAD, hypertension, hyperlipidemia, prior ICH (no anticoagulation), COPD, and PAD who presents for follow-up. He previously underwent left heart catheterization in 02/08/2004 and in 02/07/2017 and had no significant coronary artery disease. He had a BiV pacemaker and AV nodal ablation 09/08/11. At that time he had reduced systolic function in the setting of atrial fibrillation with RVR. After placement of the biventricular pacemaker his ejection fraction improved to 50-55% in 02/08/16. He was admitted 02/2017 with COPD exacerbation and acute on chronic diastolic heart failure. BNP was 491 and TEE had pleural effusions on chest x-ray. He improved with diuresis. Echocardiogram at that time revealed LVEF 55-60%, mild mitral regurgitation, and PA SP 55 mmHg. He followed up with [name] on 03/09/17 and was doing well.  Mr. Boulden was admitted 12/2018 with a COPD exacerbation  He developed increasing shortness of breath and cough.  In the hospital he required BiPAP, IV steroids and antibiotics.  He was again admitted 04/2019 with acute respiratory failure due to both COPD exacerbation and acute on chronic diastolic heart failure.  She followed up with [name], PA-C on 05/2019 and his weight was stable at 206 pounds.    Mr. Corales was admitted 09/2019 with a COPD exacerbation requiring antibiotics and steroids.  Since his last appointment Mr. Galli has been well.  He denies any chest pain or shortness of breath.  His son and granddaughter moved here and are living with them.  He enjoys being around his grandchildren.  He has been walking a lot and has  no exertional chest pain.  His breathing is been stable.  He uses 2 to 3 L of oxygen chronically.  He denies any lower extremity edema, orthopnea, or PND.  His pacemaker has been stable.  Mr. Berenson has two great grandchildren who are his pride and joy Kyla Balzarine and [place]).  His son was a Engineer, agricultural.  He died of a drug overdose in 2017-02-07.  Past Medical History:  Diagnosis Date  . Abnormal CT scan, chest 02-08-11   CT chest, several lymphadenopathies. Sees pulmonary  . Anemia    intermittent  . Arthritis    "?back" (09/19/2018)  . Atrial fibrillation (HCC)    s/p AV node ablation & BiV PPM implantation 09/08/11 (op dictation pending)  . BPH (benign prostatic hyperplasia)    Saw Dr [name] 2003-02-08, normal renal u/s  . Bronchitis 08/26/2017  . CAD (coronary artery disease)   . CHF (congestive heart failure) (HCC)    Thought primarily to be non-systolic although EF down (EF 66-06% 12/2010, down to 35-40% 09/05/11), cath 2007-02-08 with no CAD, nuclear study 07/2011 showing Small area of reversibility in the distal ant/lat wall the left ventricle suspicious for ischemia/septal wall HK but felt to be low risk  (per D/C Summary 07/2011)  . Chronic bronchitis (HCC)    "get it ~ q yr"  . Heart murmur   . HLD (hyperlipidemia)   . HTN (hypertension)   . 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 Insomnia   . Migraines    "  very very rare"  . On home oxygen therapy    "2L; 24/7" (09/19/2018)  . Pacemaker   . Peripheral vascular disease (HCC)    ??  . Pleural effusion 2008   S/p decortication  . Pulmonary HTN (HCC)    per cath 2008  . Type II diabetes mellitus (HCC) 1999    Past Surgical History:  Procedure Laterality Date  . BI-VENTRICULAR PACEMAKER INSERTION Left 09/08/2011   Procedure: BI-VENTRICULAR PACEMAKER INSERTION (CRT-P);  Surgeon: Marinus Maw, MD;  Location: [facility] CATH LAB;  Service: Cardiovascular;  Laterality: Left;  . CARDIAC CATHETERIZATION     "couple  times; never had balloon or stent" (09/19/2018)  . CATARACT EXTRACTION W/ INTRAOCULAR LENS  IMPLANT, BILATERAL Bilateral 08/2018  . COLONOSCOPY  03/10/11   normal  . INSERT / REPLACE / REMOVE PACEMAKER  09/08/11   pacemaker placement  . LUNG DECORTICATION    . PLEURAL SCARIFICATION    . pneumothorax with fibrothorax  ~ 2010  . TONSILLECTOMY     "as a kid"      Current Outpatient Medications  Medication Sig Dispense Refill  . acetaminophen (TYLENOL) 500 MG tablet Take 500 mg by mouth every 6 (six) hours as needed for headache.    Marland Kitchen amLODipine (NORVASC) 10 MG tablet Take 1 tablet (10 mg total) by mouth daily. 90 tablet 1  . aspirin 81 MG tablet Take 81 mg by mouth every morning.     Marland Kitchen atorvastatin (LIPITOR) 20 MG tablet Take 1 tablet (20 mg total) by mouth at bedtime. 90 tablet 1  . budesonide (PULMICORT) 0.5 MG/2ML nebulizer solution Take 2 mLs (0.5 mg total) by nebulization 2 (two) times daily. 360 mL 1  . calcium-vitamin D (OSCAL WITH D) 500-200 MG-UNIT tablet Take 1 tablet by mouth every morning.    . carvedilol (COREG) 25 MG tablet Take 1 tablet (25 mg total) by mouth 2 (two) times daily with a meal. 180 tablet 1  . diphenhydramine-acetaminophen (TYLENOL PM EXTRA STRENGTH) 25-500 MG TABS tablet Take 1 tablet by mouth at bedtime.    . furosemide (LASIX) 40 MG tablet TAKE 2 TABLETS DAILY EXCEPT ON MONDAY, WEDNESDAY, AND FRIDAY TAKE 2 TABLETS IN THE MORNING A 1 TABLET IN THE AFTERNOON 270 tablet 3  . ipratropium-albuterol (DUONEB) 0.5-2.5 (3) MG/3ML SOLN Take 3 mLs by nebulization 2 (two) times daily as needed. 540 mL 1  . Lancets (ONETOUCH ULTRASOFT) lancets Check blood sugars no more than twice daily 200 each 12  . losartan (COZAAR) 50 MG tablet Take 1 tablet (50 mg total) by mouth daily. 90 tablet 1  . Multiple Vitamins-Minerals (MULTIVITAMINS THER. W/MINERALS) TABS tablet Take 1 tablet by mouth daily. 30 each 5  . nitroGLYCERIN (NITROSTAT) 0.4 MG SL tablet Place 1 tablet (0.4 mg  total) under the tongue every 5 (five) minutes x 3 doses as needed for chest pain. 25 tablet 3  . ONETOUCH VERIO test strip USE TO CHECK BLOOD SUGAR NO MORE THAN TWICE A DAY 200 each 12  . pantoprazole (PROTONIX) 40 MG tablet Take 40 mg by mouth daily.    . sitaGLIPtin (JANUVIA) 100 MG tablet Take 1 tablet (100 mg total) by mouth daily. 90 tablet 3  . tamsulosin (FLOMAX) 0.4 MG CAPS capsule Take 1 capsule (0.4 mg total) by mouth daily after supper. 90 capsule 3   No current facility-administered medications for this visit.    Allergies:   Hydrocodone, Tramadol, Tadalafil, and Avelox [moxifloxacin hydrochloride]    Social History:  The patient  reports that he quit smoking about 40 years ago. His smoking use included cigarettes and cigars. He has a 62.50 pack-year smoking history. He has never used smokeless tobacco. He reports previous alcohol use. He reports that he does not use drugs.   Family History:  The patient's family history includes Breast cancer in his maternal aunt; Coronary artery disease in his father; Diabetes in his father; Drug abuse in his son; Polycythemia in his mother.    ROS:  Please see the history of present illness.   Otherwise, review of systems are positive for none.   All other systems are reviewed and negative.    PHYSICAL EXAM: VS:  BP (!) 100/56 (BP Location: Left Arm, Patient Position: Sitting)   Pulse 81   Ht 5\' 9"  (1.753 m)   Wt 190 lb (86.2 kg)   SpO2 (!) 81%   BMI 28.06 kg/m  , BMI Body mass index is 28.06 kg/m. GENERAL:  Well appearing HEENT: Pupils equal round and reactive, fundi not visualized, oral mucosa unremarkable.  Edentulous.   NECK:  No jugular venous distention, waveform within normal limits, carotid upstroke brisk and symmetric, no bruits LUNGS:  Clear to auscultation bilaterally HEART:  RRR.  PMI not displaced or sustained,S1 and S2 within normal limits, no S3, no S4, no clicks, no rubs, no murmurs ABD:  Flat, positive bowel sounds  normal in frequency in pitch, no bruits, no rebound, no guarding, no midline pulsatile mass, no hepatomegaly, no splenomegaly EXT:  2 plus pulses throughout, no edema, no cyanosis no clubbing SKIN:  No rashes no nodules NEURO:  Cranial nerves II through XII grossly intact, motor grossly intact throughout PSYCH:  Cognitively intact, oriented to person place and time   EKG:  EKG is ordered today. The ekg ordered 05/26/17 demonstrates atrial fibrillation.  Ventricular paced at 75 bpm 08/26/17: VP.  Rate 82 bpm.   11/28/20: VP.  Rate 82 bpm.    Echo 05/26/18: Study Conclusions  - Left ventricle: The cavity size was normal. Wall thickness was   normal. Systolic function was normal. The estimated ejection   fraction was in the range of 55% to 60%. Although no diagnostic   regional wall motion abnormality was identified, this possibility   cannot be completely excluded on the basis of this study. The   study was not technically sufficient to allow evaluation of LV   diastolic dysfunction due to atrial fibrillation. - Aortic valve: There was no stenosis. - Mitral valve: Moderately calcified annulus. There was trivial   regurgitation. - Left atrium: The atrium was mildly dilated. - Right ventricle: The cavity size was moderately dilated. Pacer   wire or catheter noted in right ventricle. Systolic function was   normal. - Right atrium: The atrium was severely dilated. - Tricuspid valve: Peak RV-RA gradient (S): 32 mm Hg. - Pulmonary arteries: PA peak pressure: 40 mm Hg (S). - Systemic veins: IVC measured 2.2 cm with > 50% respirophasic   variation, suggesting RA pressure 8 mmHg.  Impressions:  - The patient appeared to be in atrial fibrillation. Normal LV size   and systolic function, EF 55-60%. Moderately dilated RV with   normal systolic function. Mild pulmonary hypertension. Biatrial   enlargement. No significant valvular abnormalities.  Recent Labs: 03/25/2020: ALT 9 07/28/2020:  BUN 12; Creat 0.93; Hemoglobin 14.5; Platelets 159; Potassium 4.9; Sodium 142    Lipid Panel    Component Value Date/Time   CHOL 144 11/26/2019 1029  TRIG 120.0 11/26/2019 1029   HDL 36.50 (L) 11/26/2019 1029   CHOLHDL 4 11/26/2019 1029   VLDL 24.0 11/26/2019 1029   LDLCALC 84 11/26/2019 1029      Wt Readings from Last 3 Encounters:  11/28/20 190 lb (86.2 kg)  09/18/20 195 lb (88.5 kg)  09/16/20 202 lb (91.6 kg)      ASSESSMENT AND PLAN:  # Chronic diastolic heart failure:  # Hypertensive heart disesase: Mr. Kedzierski is euvolemic and doing well.  He is doing well on his current dose of lasix.  His shortness of breath is chronic and stable.  Continue amlodipine, carvedilol, losartan, and furosemide.  # Longstanding Persistent Atrial fibrillation: S/p AV node ablation with CRT-P.  Asymptomatic. Continue carvedilol. Mr. Lechuga is not on anticoagulation due to prior intracranial bleed.He continues to tolerate aspirin.   # Chronic hypoxia:  # COPD with frequent hospitalizations:  Stable.  Continue supplemental oxygen at rest and with exertion.   Current medicines are reviewed at length with the patient today.  The patient does not have concerns regarding medicines.  The following changes have been made:  none  Labs/ tests ordered today include:  No orders of the defined types were placed in this encounter.    Disposition:   FU with Tenia Goh C. Duke Salvia, MD, [facility] in 1 year    Signed, Purnell Daigle C. Duke Salvia, MD, [facility]  11/28/2020 5:02 PM    [facility]

## 2020-11-28 NOTE — Patient Instructions (Signed)
Medication Instructions:  Your physician recommends that you continue on your current medications as directed. Please refer to the Current Medication list given to you today.  *If you need a refill on your cardiac medications before your next appointment, please call your pharmacy*  Lab Work: NONE  Testing/Procedures: NONE  Follow-Up: At [facility], you and your health needs are our priority.  As part of our continuing mission to provide you with exceptional heart care, we have created designated Provider Care Teams.  These Care Teams include your primary Cardiologist (physician) and Advanced Practice Providers (APPs -  Physician Assistants and Nurse Practitioners) who all work together to provide you with the care you need, when you need it.  We recommend signing up for the patient portal called "MyChart".  Sign up information is provided on this After Visit Summary.  MyChart is used to connect with patients for Virtual Visits (Telemedicine).  Patients are able to view lab/test results, encounter notes, upcoming appointments, etc.  Non-urgent messages can be sent to your provider as well.   To learn more about what you can do with MyChart, go to [email or web address].    Your next appointment:   12 month(s)  The format for your next appointment:   In Person  Provider:   You may see Tiffany Doran, MD or one of the following Advanced Practice Providers on your designated Care Team:    Luke Kilroy, PA-C  Callie Goodrich, PA-C  Jesse Cleaver, FNP     

## 2020-12-02 NOTE — Progress Notes (Signed)
Remote pacemaker transmission.   

## 2020-12-09 ENCOUNTER — Ambulatory Visit (INDEPENDENT_AMBULATORY_CARE_PROVIDER_SITE_OTHER): Payer: Medicare Other | Admitting: Internal Medicine

## 2020-12-09 ENCOUNTER — Encounter: Payer: Self-pay | Admitting: Internal Medicine

## 2020-12-09 ENCOUNTER — Other Ambulatory Visit: Payer: Self-pay

## 2020-12-09 VITALS — BP 126/80 | HR 89 | Temp 98.0°F | Resp 20 | Ht 69.0 in | Wt 190.0 lb

## 2020-12-09 DIAGNOSIS — E785 Hyperlipidemia, unspecified: Secondary | ICD-10-CM | POA: Diagnosis not present

## 2020-12-09 DIAGNOSIS — E118 Type 2 diabetes mellitus with unspecified complications: Secondary | ICD-10-CM

## 2020-12-09 DIAGNOSIS — I10 Essential (primary) hypertension: Secondary | ICD-10-CM | POA: Diagnosis not present

## 2020-12-09 DIAGNOSIS — J9612 Chronic respiratory failure with hypercapnia: Secondary | ICD-10-CM | POA: Diagnosis not present

## 2020-12-09 DIAGNOSIS — J9611 Chronic respiratory failure with hypoxia: Secondary | ICD-10-CM | POA: Diagnosis not present

## 2020-12-09 DIAGNOSIS — Z1159 Encounter for screening for other viral diseases: Secondary | ICD-10-CM

## 2020-12-09 LAB — COMPREHENSIVE METABOLIC PANEL
ALT: 8 U/L (ref 0–53)
AST: 12 U/L (ref 0–37)
Albumin: 3.6 g/dL (ref 3.5–5.2)
Alkaline Phosphatase: 89 U/L (ref 39–117)
BUN: 17 mg/dL (ref 6–23)
CO2: 35 mEq/L — ABNORMAL HIGH (ref 19–32)
Calcium: 9 mg/dL (ref 8.4–10.5)
Chloride: 101 mEq/L (ref 96–112)
Creatinine, Ser: 0.94 mg/dL (ref 0.40–1.50)
GFR: 76.93 mL/min (ref >60.00)
Glucose, Bld: 97 mg/dL (ref 70–99)
Potassium: 4.7 mEq/L (ref 3.5–5.1)
Sodium: 140 mEq/L (ref 135–145)
Total Bilirubin: 0.6 mg/dL (ref 0.2–1.2)
Total Protein: 6.5 g/dL (ref 6.0–8.3)

## 2020-12-09 LAB — LIPID PANEL
Cholesterol: 127 mg/dL (ref 0–200)
HDL: 36.2 mg/dL — ABNORMAL LOW (ref >39.00)
LDL Cholesterol: 70 mg/dL (ref 0–99)
NonHDL: 90.76
Total CHOL/HDL Ratio: 4
Triglycerides: 103 mg/dL (ref 0.0–149.0)
VLDL: 20.6 mg/dL (ref 0.0–40.0)

## 2020-12-09 LAB — HEMOGLOBIN A1C: Hgb A1c MFr Bld: 6 % (ref 4.6–6.5)

## 2020-12-09 NOTE — Progress Notes (Signed)
Subjective:    Patient ID: Bradley [name]., male    DOB: August 04, 1941, 80 y.o.   MRN: [id number]  DOS:  12/09/2020 Type of visit - description: Routine checkup  In general feels well, has no major concerns. O2 sat at home varies according to his physical activity.  Shortness of breath and DOE at baseline.  No edema. No lower extremity paresthesias  BP Readings from Last 3 Encounters:  12/09/20 126/80  11/28/20 (!) 100/56  09/18/20 110/68     Review of Systems See above   Past Medical History:  Diagnosis Date  . Abnormal CT scan, chest 2012   CT chest, several lymphadenopathies. Sees pulmonary  . Anemia    intermittent  . Arthritis    "?back" (09/19/2018)  . Atrial fibrillation (HCC)    s/p AV node ablation & BiV PPM implantation 09/08/11 (op dictation pending)  . BPH (benign prostatic hyperplasia)    Saw Dr [name] 2004, normal renal u/s  . Bronchitis 08/26/2017  . CAD (coronary artery disease)   . CHF (congestive heart failure) (HCC)    Thought primarily to be non-systolic although EF down (EF 65-99% 12/2010, down to 35-40% 09/05/11), cath 2008 with no CAD, nuclear study 07/2011 showing Small area of reversibility in the distal ant/lat wall the left ventricle suspicious for ischemia/septal wall HK but felt to be low risk  (per D/C Summary 07/2011)  . Chronic bronchitis (HCC)    "get it ~ q yr"  . Heart murmur   . HLD (hyperlipidemia)   . HTN (hypertension)   . 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 Insomnia   . Migraines    "very very rare"  . On home oxygen therapy    "2L; 24/7" (09/19/2018)  . Pacemaker   . Peripheral vascular disease (HCC)    ??  . Pleural effusion 2008   S/p decortication  . Pulmonary HTN (HCC)    per cath 2008  . Type II diabetes mellitus (HCC) 1999    Past Surgical History:  Procedure Laterality Date  . BI-VENTRICULAR PACEMAKER INSERTION Left 09/08/2011   Procedure: BI-VENTRICULAR PACEMAKER INSERTION  (CRT-P);  Surgeon: Marinus Maw, MD;  Location: [facility] CATH LAB;  Service: Cardiovascular;  Laterality: Left;  . CARDIAC CATHETERIZATION     "couple times; never had balloon or stent" (09/19/2018)  . CATARACT EXTRACTION W/ INTRAOCULAR LENS  IMPLANT, BILATERAL Bilateral 08/2018  . COLONOSCOPY  03/10/11   normal  . INSERT / REPLACE / REMOVE PACEMAKER  09/08/11   pacemaker placement  . LUNG DECORTICATION    . PLEURAL SCARIFICATION    . pneumothorax with fibrothorax  ~ 2010  . TONSILLECTOMY     "as a kid"     Allergies as of 12/09/2020      Reactions   Hydrocodone Other (See Comments)   "given to him in the hospital; went thru withdrawals once home; dr said not to take it again" (09/27/2012)   Tramadol Other (See Comments)   "given to him in the hospital; went thru withdrawals once home; dr said not to take it again" (09/27/2012)   Tadalafil Other (See Comments)   headache, backache   Avelox [moxifloxacin Hydrochloride] Itching, Other (See Comments)   Headache      Medication List       Accurate as of December 09, 2020 11:59 PM. If you have any questions, ask your nurse or doctor.        acetaminophen 500  MG tablet Commonly known as: TYLENOL Take 500 mg by mouth every 6 (six) hours as needed for headache.   amLODipine 10 MG tablet Commonly known as: NORVASC Take 1 tablet (10 mg total) by mouth daily.   aspirin 81 MG tablet Take 81 mg by mouth every morning.   atorvastatin 20 MG tablet Commonly known as: LIPITOR Take 1 tablet (20 mg total) by mouth at bedtime.   budesonide 0.5 MG/2ML nebulizer solution Commonly known as: PULMICORT Take 2 mLs (0.5 mg total) by nebulization 2 (two) times daily.   calcium-vitamin D 500-200 MG-UNIT tablet Commonly known as: OSCAL WITH D Take 1 tablet by mouth every morning.   carvedilol 25 MG tablet Commonly known as: COREG Take 1 tablet (25 mg total) by mouth 2 (two) times daily with a meal.   furosemide 40 MG tablet Commonly known as:  LASIX TAKE 2 TABLETS DAILY EXCEPT ON MONDAY, WEDNESDAY, AND FRIDAY TAKE 2 TABLETS IN THE MORNING A 1 TABLET IN THE AFTERNOON   ipratropium-albuterol 0.5-2.5 (3) MG/3ML Soln Commonly known as: DUONEB Take 3 mLs by nebulization 2 (two) times daily as needed.   losartan 50 MG tablet Commonly known as: COZAAR Take 1 tablet (50 mg total) by mouth daily.   multivitamins ther. w/minerals Tabs tablet Take 1 tablet by mouth daily.   nitroGLYCERIN 0.4 MG SL tablet Commonly known as: NITROSTAT Place 1 tablet (0.4 mg total) under the tongue every 5 (five) minutes x 3 doses as needed for chest pain.   onetouch ultrasoft lancets Check blood sugars no more than twice daily   OneTouch Verio test strip Generic drug: glucose blood USE TO CHECK BLOOD SUGAR NO MORE THAN TWICE A DAY   pantoprazole 40 MG tablet Commonly known as: PROTONIX Take 40 mg by mouth daily.   sitaGLIPtin 100 MG tablet Commonly known as: Januvia Take 1 tablet (100 mg total) by mouth daily.   tamsulosin 0.4 MG Caps capsule Commonly known as: FLOMAX Take 1 capsule (0.4 mg total) by mouth daily after supper.   Tylenol PM Extra Strength 25-500 MG Tabs tablet Generic drug: diphenhydramine-acetaminophen Take 1 tablet by mouth at bedtime.            Durable Medical Equipment  (From admission, onward)         Start     Ordered   12/09/20 0000  For home use only DME Other see comment       Comments: Air Mini Oxygen Concentrator Dx:J96.11  Question:  Length of Need  Answer:  Lifetime   12/09/20 1323             Objective:   Physical Exam BP 126/80 (BP Location: Left Arm, Patient Position: Sitting, Cuff Size: Normal)   Pulse 89   Temp 98 F (36.7 C) (Oral)   Resp 20   Ht 5\' 9"  (1.753 m)   Wt 190 lb (86.2 kg)   SpO2 (!) 81% Comment: on 3L  BMI 28.06 kg/m  General:   Well developed, NAD, BMI noted. HEENT:  Normocephalic . Face symmetric, atraumatic Lungs:  Decreased breath sounds Normal  respiratory effort, no intercostal retractions, no accessory muscle use. Heart: RRR,  no murmur.  DM foot exam: No edema, normal pedal pulses, pinprick examination normal Skin: Not pale. Not jaundice Neurologic:  alert & oriented X3.  Speech normal, gait appropriate for age and unassisted Psych--  Cognition and judgment appear intact.  Cooperative with normal attention span and concentration.  Behavior appropriate. No anxious  or depressed appearing.      Assessment     Assessment  DM HTN Hyperlipidemia GERD Anxiety- insomnia  BPH Cardiovascular:Dr Ladona Ridgel  --  Cath x 2 [id number]: no significant CAD -- CHF, non-ischemic  --Atrial fibrillation; h/o AV node ablation, has a Pacemaker --h/o IC bleeding: Not anticoagulated --Peripheral vascular disease? Pulmonary: Dr [name], last OV 10-18-2014, f/u prn --COPD, severe, chronic resp failure, night O2 prn, has portable O2  --Pleural effusion s/p decortication --Pulmonary hypertension  Stasis dermatitis   PLAN: [id number] on Januvia, checking A1c. HTN: Currently on amlodipine, carvedilol, losartan, Lasix.  BP today is very good.  No change.  Check a CMP. COPD, chronic respiratory failure: Saw pulmonary 09/18/2020, rec to check oxygen levels frequently, goal more than 90% Good compliance with meds and oxygen, O2 sat drops to the 80s with exertion but is typically in the mid 90s at rest.  Request a prescription for "air mini O2 concentrator".  Provided. Hyperlipidemia: On Lipitor, check FLP Preventive care: Update on shots, check hep C RTC 4 months  This visit occurred during the SARS-CoV-2 public health emergency.  Safety protocols were in place, including screening questions prior to the visit, additional usage of staff PPE, and extensive cleaning of exam room while observing appropriate contact time as indicated for disinfecting solutions.

## 2020-12-09 NOTE — Patient Instructions (Signed)
   GO TO THE LAB : Get the blood work     GO TO THE FRONT DESK, PLEASE SCHEDULE YOUR APPOINTMENTS Come back for a office visit in 4 months.

## 2020-12-09 NOTE — Progress Notes (Signed)
Pre visit review using our clinic review tool, if applicable. No additional management support is needed unless otherwise documented below in the visit note. 

## 2020-12-10 LAB — HEPATITIS C ANTIBODY
Hepatitis C Ab: NONREACTIVE
SIGNAL TO CUT-OFF: 0.03 (ref <1.00)

## 2020-12-10 NOTE — Assessment & Plan Note (Signed)
PQ:DIYMEBRAX on Januvia, checking A1c. HTN: Currently on amlodipine, carvedilol, losartan, Lasix.  BP today is very good.  No change.  Check a CMP. COPD, chronic respiratory failure: Saw pulmonary 09/18/2020, rec to check oxygen levels frequently, goal more than 90% Good compliance with meds and oxygen, O2 sat drops to the 80s with exertion but is typically in the mid 90s at rest.  Request a prescription for "air mini O2 concentrator".  Provided. Hyperlipidemia: On Lipitor, check FLP Preventive care: Update on shots, check hep C RTC 4 months

## 2020-12-11 NOTE — Progress Notes (Signed)
Chronic Care Management Pharmacy Note  12/15/2020 Name:  GAHEL SAFLEY Sr. MRN:  [id number] DOB:  20-Aug-1941  Subjective: [name] is an 80 y.o. year old male who is a primary patient of Paz, Alda Berthold, MD.  The CCM team was consulted for assistance with disease management and care coordination needs.    Engaged with patient by telephone for follow up visit in response to provider referral for pharmacy case management and/or care coordination services.   Consent to Services:  The patient was given the following information about Chronic Care Management services today, agreed to services, and gave verbal consent: 1. CCM service includes personalized support from designated clinical staff supervised by the primary care provider, including individualized plan of care and coordination with other care providers 2. 24/7 contact phone numbers for assistance for urgent and routine care needs. 3. Service will only be billed when office clinical staff spend 20 minutes or more in a month to coordinate care. 4. Only one practitioner may furnish and bill the service in a calendar month. 5.The patient may stop CCM services at any time (effective at the end of the month) by phone call to the office staff. 6. The patient will be responsible for cost sharing (co-pay) of up to 20% of the service fee (after annual deductible is met). Patient agreed to services and consent obtained.  Patient Care Team: Colon Branch, MD as PCP - General (Internal Medicine) Skeet Latch, MD as PCP - Cardiology (Cardiology) Evans Lance, MD as Consulting Physician (Cardiology) Tanda Rockers, MD as Consulting Physician (Pulmonary Disease) Ruby Cola, MD as Consulting Physician (Otolaryngology) Joseph Art, OD (Optometry) Edythe Clarity, [facility] (Pharmacist)  Recent office visits: 12/09/20 Larose Kells) - no med changes, lipids and A1c are controlled  08/11/20: Visit w/ Dr. Larose Kells - S/P COPD exacerbation. Pt back to  baseline. Flu shot given. Encouraged pt to receive covid vaccine. No med changes noted.   07/28/20: Visit w/ Dr. Larose Kells - COPD exacerbation: prescribed doxycyline and prednisone. BP on low side. Hold losartan x2 doses and monitor BP. RTC 2 weeks  03/25/20: Visit w/ Dr. Larose Kells - No med changes noted.   Recent consult visits: 08/04/20: Pulmonology visit w/ Dr. Melvyn Novas - Take budesonide and duoneb BID. Duoneb if needed in between. RTC 6 weeks for pfts.   03/18/20: Cardio visit w/ Dr. Rosalyn Gess - No med changes noted.  Hospital visits: None in previous 6 months  Objective:  Lab Results  Component Value Date   CREATININE 0.94 12/09/2020   BUN 17 12/09/2020   GFR 76.93 12/09/2020   GFRNONAA >60 09/18/2019   GFRAA >60 09/18/2019   NA 140 12/09/2020   K 4.7 12/09/2020   CALCIUM 9.0 12/09/2020   CO2 35 (H) 12/09/2020    Lab Results  Component Value Date/Time   HGBA1C 6.0 12/09/2020 01:27 PM   HGBA1C 5.8 (H) 07/28/2020 10:26 AM   GFR 76.93 12/09/2020 01:27 PM   GFR 94.59 03/25/2020 10:34 AM   MICROALBUR 1.6 12/14/2010 10:06 AM   MICROALBUR 3.0 (H) 07/11/2009 09:18 AM    Last diabetic Eye exam:  Lab Results  Component Value Date/Time   HMDIABEYEEXA No Retinopathy 05/06/2020 12:00 AM    Last diabetic Foot exam:  Lab Results  Component Value Date/Time   HMDIABFOOTEX Done 07/15/2015 12:00 AM     Lab Results  Component Value Date   CHOL 127 12/09/2020   HDL 36.20 (L) 12/09/2020   LDLCALC 70 12/09/2020  TRIG 103.0 12/09/2020   CHOLHDL 4 12/09/2020    Hepatic Function Latest Ref Rng & Units 12/09/2020 03/25/2020 09/25/2019  Total Protein 6.0 - 8.3 g/dL 6.5 6.6 6.2  Albumin 3.5 - 5.2 g/dL 3.6 3.8 3.7  AST 0 - 37 U/L $Remo'12 14 13  'Zgivn$ ALT 0 - 53 U/L $Remo'8 9 24  'ICDPS$ Alk Phosphatase 39 - 117 U/L 89 68 64  Total Bilirubin 0.2 - 1.2 mg/dL 0.6 0.9 0.9  Bilirubin, Direct 0.0 - 0.3 mg/dL - - -    Lab Results  Component Value Date/Time   TSH 1.61 06/07/2018 02:13 PM   TSH 0.300 (L) 05/25/2018 04:46 PM    TSH 1.76 11/01/2017 11:08 AM    CBC Latest Ref Rng & Units 07/28/2020 03/25/2020 09/25/2019  WBC 3.8 - 10.8 Thousand/uL 5.9 5.6 15.1(H)  Hemoglobin 13.2 - 17.1 g/dL 14.5 13.9 14.1  Hematocrit 38.5 - 50.0 % 42.4 41.2 42.2  Platelets 140 - 400 Thousand/uL 159 169.0 189.0    No results found for: VD25OH  Clinical ASCVD: No  The ASCVD Risk score Mikey Bussing DC Jr., et al., 2013) failed to calculate for the following reasons:   The valid total cholesterol range is 130 to 320 mg/dL    Depression screen [facility] 2/9 12/09/2020 03/21/2020 11/26/2019  Decreased Interest 0 0 0  Down, Depressed, Hopeless 0 0 0  PHQ - 2 Score 0 0 0  Some recent data might be hidden      Social History   Tobacco Use  Smoking Status Former Smoker  . Packs/day: 2.50  . Years: 25.00  . Pack years: 62.50  . Types: Cigarettes, Cigars  . Quit date: 10/11/1980  . Years since quitting: 40.2  Smokeless Tobacco Never Used   BP Readings from Last 3 Encounters:  12/09/20 126/80  11/28/20 (!) 100/56  09/18/20 110/68   Pulse Readings from Last 3 Encounters:  12/09/20 89  11/28/20 81  09/18/20 84   Wt Readings from Last 3 Encounters:  12/09/20 190 lb (86.2 kg)  11/28/20 190 lb (86.2 kg)  09/18/20 195 lb (88.5 kg)    Assessment/Interventions: Review of patient past medical history, allergies, medications, health status, including review of consultants reports, laboratory and other test data, was performed as part of comprehensive evaluation and provision of chronic care management services.   SDOH:  (Social Determinants of Health) assessments and interventions performed: No  Financial Resource Strain: Low Risk   . Difficulty of Paying Living Expenses: Not hard at all    CCM Care Plan  Allergies  Allergen Reactions  . Hydrocodone Other (See Comments)    "given to him in the hospital; went thru withdrawals once home; dr said not to take it again" (09/27/2012)  . Tramadol Other (See Comments)    "given to him in  the hospital; went thru withdrawals once home; dr said not to take it again" (09/27/2012)  . Tadalafil Other (See Comments)    headache, backache  . Avelox [Moxifloxacin Hydrochloride] Itching and Other (See Comments)    Headache    Medications Reviewed Today    Reviewed by Edythe Clarity, [facility] (Pharmacist) on 12/15/20 at [place] List Status: <None>  Medication Order Taking? Sig Documenting Provider Last Dose Status Informant  acetaminophen (TYLENOL) 500 MG tablet [id number] Yes Take 500 mg by mouth every 6 (six) hours as needed for headache. [provider] Taking Active Self  amLODipine (NORVASC) 10 MG tablet [id number] Yes Take 1 tablet (10 mg total) by mouth  daily. [name], MD Taking Active   aspirin 81 MG tablet [id number] Yes Take 81 mg by mouth every morning.  [provider] Taking Active Self  atorvastatin (LIPITOR) 20 MG tablet [id number] Yes Take 1 tablet (20 mg total) by mouth at bedtime. [name], MD Taking Active   budesonide (PULMICORT) 0.5 MG/2ML nebulizer solution [id number] Yes Take 2 mLs (0.5 mg total) by nebulization 2 (two) times daily. [name], MD Taking Active   calcium-vitamin D (OSCAL WITH D) 500-200 MG-UNIT tablet [id number] Yes Take 1 tablet by mouth every morning. [provider] Taking Active Self  carvedilol (COREG) 25 MG tablet [id number] Yes Take 1 tablet (25 mg total) by mouth 2 (two) times daily with a meal. Paz, Nolon Rod, MD Taking Active   diphenhydramine-acetaminophen (TYLENOL PM EXTRA STRENGTH) 25-500 MG TABS tablet [id number] Yes Take 1 tablet by mouth at bedtime. [name], MD Taking Active Self  furosemide (LASIX) 40 MG tablet [id number] Yes TAKE 2 TABLETS DAILY EXCEPT ON MONDAY, WEDNESDAY, AND FRIDAY TAKE 2 TABLETS IN THE MORNING A 1 TABLET IN THE AFTERNOON [name], MD Taking Active   ipratropium-albuterol (DUONEB) 0.5-2.5 (3) MG/3ML SOLN [id number] Yes Take 3 mLs by nebulization 2 (two) times daily as needed. [name], MD Taking Active   Lancets [facility] ULTRASOFT) lancets [id number] Yes Check blood sugars no more than twice daily [name], MD Taking Active Self  losartan (COZAAR) 50 MG tablet [id number] Yes Take 1 tablet (50 mg total) by mouth daily. [name], MD Taking Active   Multiple Vitamins-Minerals (MULTIVITAMINS THER. W/MINERALS) TABS tablet [id number] Yes Take 1 tablet by mouth daily. [name], MD Taking Active Self  nitroGLYCERIN (NITROSTAT) 0.4 MG SL tablet [id number] Yes Place 1 tablet (0.4 mg total) under the tongue every 5 (five) minutes x 3 doses as needed for chest pain. [name], MD Taking Active Self           Med Note Loreli Dollar, Raynaldo Opitz Nov 26, 2019  9:58 AM) PRN  Lum Babe test strip [id number] Yes USE TO CHECK BLOOD SUGAR NO MORE THAN TWICE A DAY Paz, Nolon Rod, MD Taking Active   pantoprazole (PROTONIX) 40 MG tablet [id number] Yes Take 40 mg by mouth daily. [provider] Taking Active   sitaGLIPtin (JANUVIA) 100 MG tablet [id number] Yes Take 1 tablet (100 mg total) by mouth daily. [name], MD Taking Active   tamsulosin [facility]) 0.4 MG CAPS capsule [id number] Yes Take 1 capsule (0.4 mg total) by mouth daily after supper. [name], MD Taking Active           Patient Active Problem List   Diagnosis Date Noted  . Cor pulmonale (chronic) (HCC) 08/05/2020  . CAP (community acquired pneumonia) 09/13/2019  . COPD with acute exacerbation (HCC) 12/22/2018  . COPD GOLD 0 spirometry/ AB  12/22/2018  . PCP NOTES >>> 07/15/2015  . Annual physical exam 05/08/2012  . 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 Lymphadenopathy 09/23/2011  . Sinoatrial node dysfunction (HCC)   . 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 Chronic diastolic (congestive) heart failure (HCC)   . PAD (peripheral artery disease) (HCC) 10/22/2010  . DERMATITIS, STASIS 09/14/2010  . Chronic respiratory failure with hypoxia and hypercapnia (HCC) 09/14/2010  . Chronic pulmonary hypertension  secondary to elevated L H pressures  04/15/2009  . ERECTILE DYSFUNCTION 01/06/2009  . Hyperlipidemia 12/13/2007  . Essential hypertension 12/13/2007  . Anxiety--insomnia  08/14/2007  . DM II (diabetes mellitus, type II), controlled ([place]) 01/02/2007  . Permanent atrial fibrillation 01/02/2007  . Benign enlargement of prostate 01/02/2007    Immunization History  Administered Date(s) Administered  . Fluad Quad(high Dose 65+) 06/26/2019, 08/11/2020  . Influenza Split 10/01/2011  . Influenza Whole 08/14/2007, 07/11/2009, 09/14/2010  . Influenza, High Dose Seasonal PF 09/14/2013, 07/15/2015, 07/09/2016, 07/05/2017, 08/03/2018  . Influenza,inj,Quad PF,6+ Mos 07/05/2014  . Influenza-Unspecified 08/11/2012  . Janssen (J&J) SARS-COV-2 Vaccination 01/12/2020, 09/19/2020  . Pneumococcal Conjugate-13 03/12/2015  . Pneumococcal Polysaccharide-23 07/11/2004, 03/30/2009, 11/01/2017, 05/11/2019  . Td 03/12/2003  . Tetanus 09/14/2013  . Zoster 04/20/2011    Conditions to be addressed/monitored:  Hypertension, Hyperlipidemia/PAD/Cardiomyopathy, Diabetes, Afib, COPD, BPH, GERD  Care Plan : General Pharmacy (Adult)  Updates made by Edythe Clarity, RPH since 12/15/2020 12:00 AM    Problem: Hypertension, Hyperlipidemia/PAD/Cardiomyopathy, Diabetes, Afib, COPD   Priority: High  Onset Date: 12/15/2020    Goal: Patient-Specific Goal   Start Date: 12/15/2020  Expected End Date: 06/17/2021  This Visit's Progress: On track  Priority: High  Note:   Current Barriers:  . No specific barriers identified at this visit  Pharmacist Clinical Goal(s):  Marland Kitchen Over the next 120 days, patient will achieve adherence to monitoring guidelines and medication adherence to achieve therapeutic efficacy . maintain control of blood pressure and blood sugar as evidenced by home monitoring  . adhere to prescribed medication regimen as evidenced by fill dates through collaboration with PharmD and provider.    Interventions: . 1:1 collaboration with Colon Branch, MD regarding development and update of comprehensive plan of care as evidenced by provider attestation and co-signature . Inter-disciplinary care team collaboration (see longitudinal plan of care) . Comprehensive medication review performed; medication list updated in electronic medical record  Hypertension (BP goal <130/80) -Controlled -Current treatment:  Amlodipine $RemoveBef'10mg'ACagGnItHB$  daily   Losartan 50 mg daily  Carvedilol $RemoveBef'25mg'hUCjtqVNul$  twice daily -Medications previously tried: none noted -Current home readings: 123-130/70s   -Denies hypotensive/hypertensive symptoms -Educated on BP goals and benefits of medications for prevention of heart attack, stroke and kidney damage; Importance of home blood pressure monitoring; -Counseled to monitor BP at home a few times per week, document, and provide log at future appointments -Did start taking losartan $RemoveBeforeD'50mg'CTcanJnnIAIOtx$  tabs instead of splitting $RemoveBefo'100mg'enAVhsBAuqo$  -Recommended to continue current medication  Hyperlipidemia: (LDL goal < 70) -Controlled -Current treatment:  Atorvastatin $RemoveBefor'20mg'RKbDztYoPxFN$  daily  Aspirin $Remove'81mg'zZMyfAo$  daily  NTG 0.$Remo'4mg'qhron$  as needed (last used when his son died) -Medications previously tried: none noted  -Reviewed most recent lipid panel, LDL now at goal.  Congratulated him on this! -Educated on Cholesterol goals;  Benefits of statin for ASCVD risk reduction; -Recommended to continue current medication  Diabetes (A1c goal <7%) -Controlled -Current medications:  Januvia $Remove'100mg'djBcoss$  daily -Medications previously tried: metformin (heart failure?), glyburide (D/C to deprescribe per a1c) -Current home glucose readings . No specific readings, reports all normal -Denies hypoglycemic/hyperglycemic symptoms -Current meal patterns:  Pt states he does not eat fried foods.  His grand daughter that lives with them prepares all meals.  His appetite has lessened to where he eats half the portion sizes as he previously did.   He reports in the past he could eat a whole pound of macaroni by himself, now when he prepares 1/2 lbs of macaroni it is enough for him and his wife plus left overs.   -Educated onA1c and blood sugar goals; Prevention and management of hypoglycemic episodes; To continue current lifestyle and we could  consider d/c of Januvia down the road with continued control. -Counseled to check feet daily and get yearly eye exams -Recommended to continue current medication  Atrial Fibrillation (Goal: prevent stroke and major bleeding) -Controlled  -Current treatment:  Rate control: Carvedilol 25mg  twice daily . Anticoagulation: None -Medications previously tried: coumadin (intracranial bleed in 2013) -Home BP and HR readings: BP given was 120-130/70s.  No HR provided, unable to assess  -Counseled on importance of regular laboratory monitoring;  -Denies any symptoms -Recommended to continue current medication  COPD (Goal: control symptoms and prevent exacerbations) -Controlled -Current treatment   Pulmicort (Budesonide) 0.5mg /33mL nebulized twice daily  DuoNeb (Ipratropium/Albuterol) 0.5/2.5/55mL nebulized twice daily -Medications previously tried: none noted  -Gold Grade: Gold 2 (FEV1 50-79%) -Current COPD Classification:  B (high sx, <2 exacerbations/yr)  -Pulmonary function testing:  10/18/2014 FEV1 1.71 (59%) with ratio 68 and no sign change p B2 and DLCO 53% corrects to 85  Scheduled for repeat PFTs on 09/18/20 -Exacerbations requiring treatment in last 6 months: None  -Counseled on benefits of consistent medication use.  -Continues to use oxygen outside of the home, denies any SOB episodes recently.  Patient seems very pleased with current state. -Recommended to continue current medication   Patient Goals/Self-Care Activities . Over the next 120 days, patient will:  - take medications as prescribed check blood pressure daily, document, and provide at future appointments target a  minimum of 150 minutes of moderate intensity exercise weekly  Follow Up Plan: The care management team will reach out to the patient again over the next 120 days.       Medication Assistance: None required.  Patient affirms current coverage meets needs.  Patient's preferred pharmacy is:  [id number] - [place], [place] - [address] AT [place] [address] [place] [address] Phone: [phone] Fax: [phone]  [facility] [place] [place] [address] [place] [address] Phone: [phone] Fax: [phone]  Uses pill box? Yes Pt endorses 100% compliance  We discussed: Benefits of medication synchronization, packaging and delivery as well as enhanced pharmacist oversight with [employer]. Patient decided to: Continue current medication management strategy  Care Plan and Follow Up Patient Decision:  Patient agrees to Care Plan and Follow-up.  Plan: The care management team will reach out to the patient again over the next 120 days.  Beverly Milch, PharmD Clinical Pharmacist [place] [phone]

## 2020-12-15 ENCOUNTER — Ambulatory Visit (INDEPENDENT_AMBULATORY_CARE_PROVIDER_SITE_OTHER): Payer: Medicare Other | Admitting: Pharmacist

## 2020-12-15 DIAGNOSIS — E118 Type 2 diabetes mellitus with unspecified complications: Secondary | ICD-10-CM

## 2020-12-15 DIAGNOSIS — I10 Essential (primary) hypertension: Secondary | ICD-10-CM | POA: Diagnosis not present

## 2020-12-15 DIAGNOSIS — E785 Hyperlipidemia, unspecified: Secondary | ICD-10-CM

## 2020-12-15 NOTE — Patient Instructions (Addendum)
Visit Information  Goals Addressed            This Visit's Progress   . Monitor and Manage My Blood Sugar-Diabetes Type 2       Timeframe:  Long-Range Goal Priority:  High Start Date:           12/15/20                  Expected End Date:     06/17/21                  Follow Up Date 05/09/21   - check blood sugar at prescribed times - check blood sugar if I feel it is too high or too low - enter blood sugar readings and medication or insulin into daily log - take the blood sugar log to all doctor visits  -Contact providers with any episodes of hypoglycemia < 70   Why is this important?    Checking your blood sugar at home helps to keep it from getting very high or very low.   Writing the results in a diary or log helps the doctor know how to care for you.   Your blood sugar log should have the time, date and the results.   Also, write down the amount of insulin or other medicine that you take.   Other information, like what you ate, exercise done and how you were feeling, will also be helpful.     Notes:       Patient Care Plan: General Pharmacy (Adult)    Problem Identified: Hypertension, Hyperlipidemia/PAD/Cardiomyopathy, Diabetes, Afib, COPD   Priority: High  Onset Date: 12/15/2020    Goal: Patient-Specific Goal   Start Date: 12/15/2020  Expected End Date: 06/17/2021  This Visit's Progress: On track  Priority: High  Note:   Current Barriers:  . No specific barriers identified at this visit  Pharmacist Clinical Goal(s):  Marland Kitchen Over the next 120 days, patient will achieve adherence to monitoring guidelines and medication adherence to achieve therapeutic efficacy . maintain control of blood pressure and blood sugar as evidenced by home monitoring  . adhere to prescribed medication regimen as evidenced by fill dates through collaboration with PharmD and provider.   Interventions: . 1:1 collaboration with [name], MD regarding development and update of comprehensive  plan of care as evidenced by provider attestation and co-signature . Inter-disciplinary care team collaboration (see longitudinal plan of care) . Comprehensive medication review performed; medication list updated in electronic medical record  Hypertension (BP goal <130/80) -Controlled -Current treatment:  Amlodipine 10mg  daily   Losartan 50 mg daily  Carvedilol 25mg  twice daily -Medications previously tried: none noted -Current home readings: 123-130/70s   -Denies hypotensive/hypertensive symptoms -Educated on BP goals and benefits of medications for prevention of heart attack, stroke and kidney damage; Importance of home blood pressure monitoring; -Counseled to monitor BP at home a few times per week, document, and provide log at future appointments -Did start taking losartan 50mg  tabs instead of splitting 100mg  -Recommended to continue current medication  Hyperlipidemia: (LDL goal < 70) -Controlled -Current treatment:  Atorvastatin 20mg  daily  Aspirin 81mg  daily  NTG 0.4mg  as needed (last used when his son died) -Medications previously tried: none noted  -Reviewed most recent lipid panel, LDL now at goal.  Congratulated him on this! -Educated on Cholesterol goals;  Benefits of statin for ASCVD risk reduction; -Recommended to continue current medication  Diabetes (A1c goal <7%) -Controlled -Current medications:  Januvia  100mg  daily -Medications previously tried: metformin (heart failure?), glyburide (D/C to deprescribe per a1c) -Current home glucose readings . No specific readings, reports all normal -Denies hypoglycemic/hyperglycemic symptoms -Current meal patterns:  Pt states he does not eat fried foods.  His grand daughter that lives with them prepares all meals.  His appetite has lessened to where he eats half the portion sizes as he previously did.  He reports in the past he could eat a whole pound of macaroni by himself, now when he prepares 1/2 lbs of  macaroni it is enough for him and his wife plus left overs.   -Educated onA1c and blood sugar goals; Prevention and management of hypoglycemic episodes; To continue current lifestyle and we could consider d/c of Januvia down the road with continued control. -Counseled to check feet daily and get yearly eye exams -Recommended to continue current medication  Atrial Fibrillation (Goal: prevent stroke and major bleeding) -Controlled  -Current treatment:  Rate control: Carvedilol 25mg  twice daily . Anticoagulation: None -Medications previously tried: coumadin (intracranial bleed in 2013) -Home BP and HR readings: BP given was 120-130/70s.  No HR provided, unable to assess  -Counseled on importance of regular laboratory monitoring;  -Denies any symptoms -Recommended to continue current medication  COPD (Goal: control symptoms and prevent exacerbations) -Controlled -Current treatment   Pulmicort (Budesonide) 0.5mg /47mL nebulized twice daily  DuoNeb (Ipratropium/Albuterol) 0.5/2.5/58mL nebulized twice daily -Medications previously tried: none noted  -Gold Grade: Gold 2 (FEV1 50-79%) -Current COPD Classification:  B (high sx, <2 exacerbations/yr)  -Pulmonary function testing:  10/18/2014 FEV1 1.71 (59%) with ratio 68 and no sign change p B2 and DLCO 53% corrects to 85  Scheduled for repeat PFTs on 09/18/20 -Exacerbations requiring treatment in last 6 months: None  -Counseled on benefits of consistent medication use.  -Continues to use oxygen outside of the home, denies any SOB episodes recently.  Patient seems very pleased with current state. -Recommended to continue current medication   Patient Goals/Self-Care Activities . Over the next 120 days, patient will:  - take medications as prescribed check blood pressure daily, document, and provide at future appointments target a minimum of 150 minutes of moderate intensity exercise weekly  Follow Up Plan: The care management team will  reach out to the patient again over the next 120 days.       The patient verbalized understanding of instructions, educational materials, and care plan provided today and agreed to receive a mailed copy of patient instructions, educational materials, and care plan.  Telephone follow up appointment with pharmacy team member scheduled for: 4 months  Bradley [name], [facility]  Hypertension, Adult High blood pressure (hypertension) is when the force of blood pumping through the arteries is too strong. The arteries are the blood vessels that carry blood from the heart throughout the body. Hypertension forces the heart to work harder to pump blood and may cause arteries to become narrow or stiff. Untreated or uncontrolled hypertension can cause a heart attack, heart failure, a stroke, kidney disease, and other problems. A blood pressure reading consists of a higher number over a lower number. Ideally, your blood pressure should be below 120/80. The first ("top") number is called the systolic pressure. It is a measure of the pressure in your arteries as your heart beats. The second ("bottom") number is called the diastolic pressure. It is a measure of the pressure in your arteries as the heart relaxes. What are the causes? The exact cause of this condition is not known.  There are some conditions that result in or are related to high blood pressure. What increases the risk? Some risk factors for high blood pressure are under your control. The following factors may make you more likely to develop this condition:  Smoking.  Having type 2 diabetes mellitus, high cholesterol, or both.  Not getting enough exercise or physical activity.  Being overweight.  Having too much fat, sugar, calories, or salt (sodium) in your diet.  Drinking too much alcohol. Some risk factors for high blood pressure may be difficult or impossible to change. Some of these factors include:  Having chronic kidney  disease.  Having a family history of high blood pressure.  Age. Risk increases with age.  Race. You may be at higher risk if you are African American.  Gender. Men are at higher risk than women before age 90. After age 65, women are at higher risk than men.  Having obstructive sleep apnea.  Stress. What are the signs or symptoms? High blood pressure may not cause symptoms. Very high blood pressure (hypertensive crisis) may cause:  Headache.  Anxiety.  Shortness of breath.  Nosebleed.  Nausea and vomiting.  Vision changes.  Severe chest pain.  Seizures. How is this diagnosed? This condition is diagnosed by measuring your blood pressure while you are seated, with your arm resting on a flat surface, your legs uncrossed, and your feet flat on the floor. The cuff of the blood pressure monitor will be placed directly against the skin of your upper arm at the level of your heart. It should be measured at least twice using the same arm. Certain conditions can cause a difference in blood pressure between your right and left arms. Certain factors can cause blood pressure readings to be lower or higher than normal for a short period of time:  When your blood pressure is higher when you are in a health care provider's office than when you are at home, this is called white coat hypertension. Most people with this condition do not need medicines.  When your blood pressure is higher at home than when you are in a health care provider's office, this is called masked hypertension. Most people with this condition may need medicines to control blood pressure. If you have a high blood pressure reading during one visit or you have normal blood pressure with other risk factors, you may be asked to:  Return on a different day to have your blood pressure checked again.  Monitor your blood pressure at home for 1 week or longer. If you are diagnosed with hypertension, you may have other blood or  imaging tests to help your health care provider understand your overall risk for other conditions. How is this treated? This condition is treated by making healthy lifestyle changes, such as eating healthy foods, exercising more, and reducing your alcohol intake. Your health care provider may prescribe medicine if lifestyle changes are not enough to get your blood pressure under control, and if:  Your systolic blood pressure is above 130.  Your diastolic blood pressure is above 80. Your personal target blood pressure may vary depending on your medical conditions, your age, and other factors. Follow these instructions at home: Eating and drinking  Eat a diet that is high in fiber and potassium, and low in sodium, added sugar, and fat. An example eating plan is called the DASH (Dietary Approaches to Stop Hypertension) diet. To eat this way: ? Eat plenty of fresh fruits and vegetables. Try to  fill one half of your plate at each meal with fruits and vegetables. ? Eat whole grains, such as whole-wheat pasta, brown rice, or whole-grain bread. Fill about one fourth of your plate with whole grains. ? Eat or drink low-fat dairy products, such as skim milk or low-fat yogurt. ? Avoid fatty cuts of meat, processed or cured meats, and poultry with skin. Fill about one fourth of your plate with lean proteins, such as fish, chicken without skin, beans, eggs, or tofu. ? Avoid pre-made and processed foods. These tend to be higher in sodium, added sugar, and fat.  Reduce your daily sodium intake. Most people with hypertension should eat less than 1,500 mg of sodium a day.  Do not drink alcohol if: ? Your health care provider tells you not to drink. ? You are pregnant, may be pregnant, or are planning to become pregnant.  If you drink alcohol: ? Limit how much you use to:  0-1 drink a day for women.  0-2 drinks a day for men. ? Be aware of how much alcohol is in your drink. In the [place] one drink equals  one 12 oz bottle of beer (355 mL), one 5 oz glass of wine (148 mL), or one 1 oz glass of hard liquor (44 mL).   Lifestyle  Work with your health care provider to maintain a healthy body weight or to lose weight. Ask what an ideal weight is for you.  Get at least 30 minutes of exercise most days of the week. Activities may include walking, swimming, or biking.  Include exercise to strengthen your muscles (resistance exercise), such as Pilates or lifting weights, as part of your weekly exercise routine. Try to do these types of exercises for 30 minutes at least 3 days a week.  Do not use any products that contain nicotine or tobacco, such as cigarettes, e-cigarettes, and chewing tobacco. If you need help quitting, ask your health care provider.  Monitor your blood pressure at home as told by your health care provider.  Keep all follow-up visits as told by your health care provider. This is important.   Medicines  Take over-the-counter and prescription medicines only as told by your health care provider. Follow directions carefully. Blood pressure medicines must be taken as prescribed.  Do not skip doses of blood pressure medicine. Doing this puts you at risk for problems and can make the medicine less effective.  Ask your health care provider about side effects or reactions to medicines that you should watch for. Contact a health care provider if you:  Think you are having a reaction to a medicine you are taking.  Have headaches that keep coming back (recurring).  Feel dizzy.  Have swelling in your ankles.  Have trouble with your vision. Get help right away if you:  Develop a severe headache or confusion.  Have unusual weakness or numbness.  Feel faint.  Have severe pain in your chest or abdomen.  Vomit repeatedly.  Have trouble breathing. Summary  Hypertension is when the force of blood pumping through your arteries is too strong. If this condition is not controlled, it  may put you at risk for serious complications.  Your personal target blood pressure may vary depending on your medical conditions, your age, and other factors. For most people, a normal blood pressure is less than 120/80.  Hypertension is treated with lifestyle changes, medicines, or a combination of both. Lifestyle changes include losing weight, eating a healthy, low-sodium diet, exercising more,  and limiting alcohol. This information is not intended to replace advice given to you by your health care provider. Make sure you discuss any questions you have with your health care provider. Document Revised: 06/07/2018 Document Reviewed: 06/07/2018 Elsevier Patient Education  2021 [employer]

## 2021-01-08 ENCOUNTER — Telehealth: Payer: Self-pay | Admitting: Pharmacist

## 2021-01-08 NOTE — Progress Notes (Signed)
    Chronic Care Management Pharmacy Assistant   Name: Bradley [name] Sr.  MRN: [id number] DOB: [birth year]  Reason for Encounter: Adherence Review  I reviewed the patients chart for any medical/health changes and/or medication changes, there were not any changes at this time.   Verified Adherence Gap Information. Per insurance data patient has met their wellness bundle and annual wellness screening. The patient also met the depression screening which was negative.Their most recent A1C was 5.8 on 07/28/20. Their most recent blood pressure was 104/62 on 08/04/20. The patient met goal by keeping their blood pressure lower than 140/90. The patient is a non tobacco user.    9 min   Follow-Up:Pharmacist Review  Charlann Lange, [place] Pharmacist Assistant [phone]

## 2021-01-27 ENCOUNTER — Telehealth: Payer: Self-pay | Admitting: Internal Medicine

## 2021-01-27 ENCOUNTER — Other Ambulatory Visit: Payer: Self-pay | Admitting: Internal Medicine

## 2021-01-27 MED ORDER — SITAGLIPTIN PHOSPHATE 100 MG PO TABS: 100.0000 mg | ORAL_TABLET | Freq: Every day | ORAL | 1 refills | Status: DC

## 2021-01-27 MED ORDER — SITAGLIPTIN PHOSPHATE 100 MG PO TABS: 100.0000 mg | ORAL_TABLET | Freq: Every day | ORAL | 0 refills | Status: DC

## 2021-01-27 NOTE — Telephone Encounter (Signed)
Rx sent 

## 2021-01-27 NOTE — Telephone Encounter (Signed)
Caller : Larz  Call Back @ [phone]  sitaGLIPtin (JANUVIA) 100 MG tablet   Patient state he would like 1 refill of medication above sent to his local pharmacy. Then he would like his other refills sent to [facility]   Please advise

## 2021-02-09 ENCOUNTER — Other Ambulatory Visit: Payer: Self-pay | Admitting: Internal Medicine

## 2021-02-17 ENCOUNTER — Ambulatory Visit (INDEPENDENT_AMBULATORY_CARE_PROVIDER_SITE_OTHER): Payer: Medicare Other | Admitting: Internal Medicine

## 2021-02-17 ENCOUNTER — Encounter: Payer: Self-pay | Admitting: Internal Medicine

## 2021-02-17 ENCOUNTER — Other Ambulatory Visit: Payer: Self-pay

## 2021-02-17 VITALS — BP 134/78 | HR 93 | Ht 69.0 in | Wt 194.0 lb

## 2021-02-17 DIAGNOSIS — I428 Other cardiomyopathies: Secondary | ICD-10-CM

## 2021-02-17 DIAGNOSIS — I4811 Longstanding persistent atrial fibrillation: Secondary | ICD-10-CM

## 2021-02-17 DIAGNOSIS — I495 Sick sinus syndrome: Secondary | ICD-10-CM

## 2021-02-17 DIAGNOSIS — Z950 Presence of cardiac pacemaker: Secondary | ICD-10-CM | POA: Diagnosis not present

## 2021-02-17 NOTE — Progress Notes (Signed)
HPI [name] returns today for ongoing evaluation. He is a pleasant 80 yo man with peristent and uncontrolled atrial fib and LV dysfunction who underwent PPM insertion and AV node ablation 10 years ago. He has done well in the interim except for some COPD. He denies palpitations, sob or chest pressure. No edema.  Allergies  Allergen Reactions  . Hydrocodone Other (See Comments)    "given to him in the hospital; went thru withdrawals once home; dr said not to take it again" (09/27/2012)  . Tramadol Other (See Comments)    "given to him in the hospital; went thru withdrawals once home; dr said not to take it again" (09/27/2012)  . Tadalafil Other (See Comments)    headache, backache  . Avelox [Moxifloxacin Hydrochloride] Itching and Other (See Comments)    Headache     Current Outpatient Medications  Medication Sig Dispense Refill  . acetaminophen (TYLENOL) 500 MG tablet Take 500 mg by mouth every 6 (six) hours as needed for headache.    Marland Kitchen amLODipine (NORVASC) 10 MG tablet Take 1 tablet (10 mg total) by mouth daily. 90 tablet 1  . aspirin 81 MG tablet Take 81 mg by mouth every morning.     Marland Kitchen atorvastatin (LIPITOR) 20 MG tablet Take 1 tablet (20 mg total) by mouth at bedtime. 90 tablet 1  . budesonide (PULMICORT) 0.5 MG/2ML nebulizer solution Take 2 mLs (0.5 mg total) by nebulization 2 (two) times daily. 360 mL 1  . calcium-vitamin D (OSCAL WITH D) 500-200 MG-UNIT tablet Take 1 tablet by mouth every morning.    . carvedilol (COREG) 25 MG tablet Take 1 tablet (25 mg total) by mouth 2 (two) times daily with a meal. 180 tablet 1  . diphenhydramine-acetaminophen (TYLENOL PM EXTRA STRENGTH) 25-500 MG TABS tablet Take 1 tablet by mouth at bedtime.    . furosemide (LASIX) 40 MG tablet TAKE 2 TABLETS DAILY EXCEPT ON MONDAY, WEDNESDAY, AND FRIDAY TAKE 2 TABLETS IN THE MORNING A 1 TABLET IN THE AFTERNOON 270 tablet 3  . ipratropium-albuterol (DUONEB) 0.5-2.5 (3) MG/3ML SOLN Take 3 mLs by  nebulization 2 (two) times daily as needed. 540 mL 1  . Lancets (ONETOUCH ULTRASOFT) lancets Check blood sugars no more than twice daily 200 each 12  . losartan (COZAAR) 50 MG tablet Take 1 tablet (50 mg total) by mouth daily. 90 tablet 1  . Multiple Vitamins-Minerals (MULTIVITAMINS THER. W/MINERALS) TABS tablet Take 1 tablet by mouth daily. 30 each 5  . nitroGLYCERIN (NITROSTAT) 0.4 MG SL tablet Place 1 tablet (0.4 mg total) under the tongue every 5 (five) minutes x 3 doses as needed for chest pain. 25 tablet 3  . ONETOUCH VERIO test strip USE TO CHECK BLOOD SUGAR NO MORE THAN TWICE A DAY 200 strip 12  . pantoprazole (PROTONIX) 40 MG tablet Take 40 mg by mouth daily.    . sitaGLIPtin (JANUVIA) 100 MG tablet Take 1 tablet (100 mg total) by mouth daily. 90 tablet 1  . tamsulosin (FLOMAX) 0.4 MG CAPS capsule Take 1 capsule (0.4 mg total) by mouth daily after supper. 90 capsule 3   No current facility-administered medications for this visit.     Past Medical History:  Diagnosis Date  . Abnormal CT scan, chest 2012   CT chest, several lymphadenopathies. Sees pulmonary  . Anemia    intermittent  . Arthritis    "?back" (09/19/2018)  . Atrial fibrillation (HCC)    s/p AV node ablation &  BiV PPM implantation 09/08/11 (op dictation pending)  . BPH (benign prostatic hyperplasia)    Saw Dr [name] 2004, normal renal u/s  . Bronchitis 08/26/2017  . CAD (coronary artery disease)   . CHF (congestive heart failure) (HCC)    Thought primarily to be non-systolic although EF down (EF 65-46% 12/2010, down to 35-40% 09/05/11), cath 2008 with no CAD, nuclear study 07/2011 showing Small area of reversibility in the distal ant/lat wall the left ventricle suspicious for ischemia/septal wall HK but felt to be low risk  (per D/C Summary 07/2011)  . Chronic bronchitis (HCC)    "get it ~ q yr"  . Heart murmur   . HLD (hyperlipidemia)   . HTN (hypertension)   . ICB (intracranial bleed) (HCC) 06/2012   d/c  coumadin permanently  . Insomnia   . Migraines    "very very rare"  . On home oxygen therapy    "2L; 24/7" (09/19/2018)  . Pacemaker   . Peripheral vascular disease (HCC)    ??  . Pleural effusion 2008   S/p decortication  . Pulmonary HTN (HCC)    per cath 2008  . Type II diabetes mellitus (HCC) 1999    ROS:   All systems reviewed and negative except as noted in the HPI.   Past Surgical History:  Procedure Laterality Date  . BI-VENTRICULAR PACEMAKER INSERTION Left 09/08/2011   Procedure: BI-VENTRICULAR PACEMAKER INSERTION (CRT-P);  Surgeon: Bradley Maw, MD;  Location: [facility] CATH LAB;  Service: Cardiovascular;  Laterality: Left;  . CARDIAC CATHETERIZATION     "couple times; never had balloon or stent" (09/19/2018)  . CATARACT EXTRACTION W/ INTRAOCULAR LENS  IMPLANT, BILATERAL Bilateral 08/2018  . COLONOSCOPY  03/10/11   normal  . INSERT / REPLACE / REMOVE PACEMAKER  09/08/11   pacemaker placement  . LUNG DECORTICATION    . PLEURAL SCARIFICATION    . pneumothorax with fibrothorax  ~ 2010  . TONSILLECTOMY     "as a kid"      Family History  Problem Relation Age of Onset  . Diabetes Father   . Coronary artery disease Father   . Polycythemia Mother   . Drug abuse Son   . Breast cancer Maternal Aunt   . Prostate cancer Neg Hx   . Colon cancer Neg Hx      Social History   Socioeconomic History  . Marital status: Married    Spouse name: Bradley [name]  . Number of children: 1  . Years of education: Not on file  . Highest education level: Not on file  Occupational History  . Occupation: retired Publishing rights manager: RETIRED  Tobacco Use  . Smoking status: Former Smoker    Packs/day: 2.50    Years: 25.00    Pack years: 62.50    Types: Cigarettes, Cigars    Quit date: 10/11/1980    Years since quitting: 40.3  . Smokeless tobacco: Never Used  Vaping Use  . Vaping Use: Never used  Substance and Sexual Activity  . Alcohol use: Not Currently    Comment:  1 can of beer ever 4-5 months  . Drug use: No  . Sexual activity: Not Currently  Other Topics Concern  . Not on file  Social History Narrative   Moved from [place] 2003.Marland Kitchen   He lives w/ his wife, [name]   1 son, substance abuse, passed away 10/03/16         Social Determinants of Health   Financial  Resource Strain: Low Risk   . Difficulty of Paying Living Expenses: Not hard at all  Food Insecurity: No Food Insecurity  . Worried About Programme researcher, broadcasting/film/video in the Last Year: Never true  . Ran Out of Food in the Last Year: Never true  Transportation Needs: No Transportation Needs  . Lack of Transportation (Medical): No  . Lack of Transportation (Non-Medical): No  Physical Activity: Not on file  Stress: Not on file  Social Connections: Not on file  Intimate Partner Violence: Not on file     BP 134/78   Pulse 93   Ht 5\' 9"  (1.753 m)   Wt 194 lb (88 kg)   SpO2 (!) 84%   BMI 28.65 kg/m   Physical Exam:  Well appearing NAD HEENT: Unremarkable Neck:  No JVD, no thyromegally Lymphatics:  No adenopathy Back:  No CVA tenderness Lungs:  Clear with no wheezes HEART:  Regular rate rhythm, no murmurs, no rubs, no clicks Abd:  soft, positive bowel sounds, no organomegally, no rebound, no guarding Ext:  2 plus pulses, no edema, no cyanosis, no clubbing Skin:  No rashes no nodules Neuro:  CN II through XII intact, motor grossly intact  DEVICE  Normal device function.  See PaceArt for details.   Assess/Plan: 1. Atrial fib - his rates are well controlled, s/p AV node ablation.  2. BIV PPM - his medtronic biv PPM is working normally. Thresholds are good. 3. HTN - his bp is well controlled. He will maintain a low sodium diet. 4. Chronic diastolic heart failure - his symptoms are class 2. He will continue his current meds.  Mehak Roskelley,MD

## 2021-02-17 NOTE — Patient Instructions (Signed)
Medication Instructions:  Your physician recommends that you continue on your current medications as directed. Please refer to the Current Medication list given to you today.  Labwork: None ordered.  Testing/Procedures: None ordered.  Follow-Up: Your physician wants you to follow-up in: one year with Gregg Taylor, MD or one of the following Advanced Practice Providers on your designated Care Team:    Amber Seiler, NP  Renee Ursuy, PA-C  Michael "Andy" Tillery, PA-C  Remote monitoring is used to monitor your Pacemaker from home. This monitoring reduces the number of office visits required to check your device to one time per year. It allows us to keep an eye on the functioning of your device to ensure it is working properly. You are scheduled for a device check from home on 02/24/2021. You may send your transmission at any time that day. If you have a wireless device, the transmission will be sent automatically. After your physician reviews your transmission, you will receive a postcard with your next transmission date.  Any Other Special Instructions Will Be Listed Below (If Applicable).  If you need a refill on your cardiac medications before your next appointment, please call your pharmacy.       

## 2021-02-24 ENCOUNTER — Telehealth: Payer: Self-pay

## 2021-02-24 ENCOUNTER — Ambulatory Visit (INDEPENDENT_AMBULATORY_CARE_PROVIDER_SITE_OTHER): Payer: Medicare Other

## 2021-02-24 DIAGNOSIS — I428 Other cardiomyopathies: Secondary | ICD-10-CM | POA: Diagnosis not present

## 2021-02-24 NOTE — Telephone Encounter (Signed)
The patient tried to send a transmission with his home monitor but was unsuccessful. I called tech support with the patient and they are sending him a new wire x in 7-10 business days.

## 2021-03-03 LAB — CUP PACEART REMOTE DEVICE CHECK
Battery Remaining Longevity: 17 mo
Battery Voltage: 2.88 V
Brady Statistic AP VP Percent: 0 %
Brady Statistic AP VS Percent: 0 %
Brady Statistic AS VP Percent: 100 %
Brady Statistic AS VS Percent: 0 %
Brady Statistic RA Percent Paced: 0 %
Brady Statistic RV Percent Paced: 99.77 %
Date Time Interrogation Session: 20220520003420
Implantable Lead Implant Date: 20121128
Implantable Lead Implant Date: 20121128
Implantable Lead Location: 753858
Implantable Lead Location: 753860
Implantable Lead Model: 4194
Implantable Lead Model: 5076
Implantable Pulse Generator Implant Date: 20121128
Lead Channel Impedance Value: 380 Ohm
Lead Channel Impedance Value: 399 Ohm
Lead Channel Impedance Value: 4047 Ohm
Lead Channel Impedance Value: 4047 Ohm
Lead Channel Impedance Value: 437 Ohm
Lead Channel Impedance Value: 456 Ohm
Lead Channel Impedance Value: 456 Ohm
Lead Channel Impedance Value: 532 Ohm
Lead Channel Impedance Value: 627 Ohm
Lead Channel Pacing Threshold Amplitude: 0.625 V
Lead Channel Pacing Threshold Amplitude: 1.25 V
Lead Channel Pacing Threshold Pulse Width: 0.4 ms
Lead Channel Pacing Threshold Pulse Width: 0.4 ms
Lead Channel Sensing Intrinsic Amplitude: 12.75 mV
Lead Channel Sensing Intrinsic Amplitude: 12.75 mV
Lead Channel Setting Pacing Amplitude: 2 V
Lead Channel Setting Pacing Amplitude: 2.25 V
Lead Channel Setting Pacing Pulse Width: 0.4 ms
Lead Channel Setting Pacing Pulse Width: 0.4 ms
Lead Channel Setting Sensing Sensitivity: 4 mV

## 2021-03-05 ENCOUNTER — Inpatient Hospital Stay (HOSPITAL_COMMUNITY): Payer: Medicare Other

## 2021-03-05 ENCOUNTER — Encounter (HOSPITAL_COMMUNITY): Payer: Self-pay | Admitting: *Deleted

## 2021-03-05 ENCOUNTER — Telehealth: Payer: Self-pay | Admitting: Internal Medicine

## 2021-03-05 ENCOUNTER — Emergency Department (HOSPITAL_COMMUNITY): Payer: Medicare Other

## 2021-03-05 ENCOUNTER — Inpatient Hospital Stay (HOSPITAL_COMMUNITY)
Admission: EM | Admit: 2021-03-05 | Discharge: 2021-03-11 | DRG: 193 | Disposition: A | Discharge status: Home / Self Care | Payer: Medicare Other | Attending: Internal Medicine | Admitting: Internal Medicine

## 2021-03-05 ENCOUNTER — Other Ambulatory Visit: Payer: Self-pay | Admitting: Internal Medicine

## 2021-03-05 ENCOUNTER — Other Ambulatory Visit: Payer: Self-pay

## 2021-03-05 DIAGNOSIS — I110 Hypertensive heart disease with heart failure: Secondary | ICD-10-CM | POA: Diagnosis present

## 2021-03-05 DIAGNOSIS — Z8673 Personal history of transient ischemic attack (TIA), and cerebral infarction without residual deficits: Secondary | ICD-10-CM

## 2021-03-05 DIAGNOSIS — J189 Pneumonia, unspecified organism: Secondary | ICD-10-CM | POA: Diagnosis present

## 2021-03-05 DIAGNOSIS — I272 Pulmonary hypertension, unspecified: Secondary | ICD-10-CM | POA: Diagnosis present

## 2021-03-05 DIAGNOSIS — J180 Bronchopneumonia, unspecified organism: Secondary | ICD-10-CM | POA: Diagnosis present

## 2021-03-05 DIAGNOSIS — I4819 Other persistent atrial fibrillation: Secondary | ICD-10-CM | POA: Diagnosis present

## 2021-03-05 DIAGNOSIS — I5033 Acute on chronic diastolic (congestive) heart failure: Secondary | ICD-10-CM | POA: Diagnosis present

## 2021-03-05 DIAGNOSIS — I428 Other cardiomyopathies: Secondary | ICD-10-CM

## 2021-03-05 DIAGNOSIS — Z9981 Dependence on supplemental oxygen: Secondary | ICD-10-CM

## 2021-03-05 DIAGNOSIS — J9621 Acute and chronic respiratory failure with hypoxia: Secondary | ICD-10-CM | POA: Diagnosis present

## 2021-03-05 DIAGNOSIS — Z87891 Personal history of nicotine dependence: Secondary | ICD-10-CM

## 2021-03-05 DIAGNOSIS — I2510 Atherosclerotic heart disease of native coronary artery without angina pectoris: Secondary | ICD-10-CM | POA: Diagnosis present

## 2021-03-05 DIAGNOSIS — Z885 Allergy status to narcotic agent status: Secondary | ICD-10-CM

## 2021-03-05 DIAGNOSIS — I472 Ventricular tachycardia: Secondary | ICD-10-CM | POA: Diagnosis not present

## 2021-03-05 DIAGNOSIS — E1151 Type 2 diabetes mellitus with diabetic peripheral angiopathy without gangrene: Secondary | ICD-10-CM | POA: Diagnosis present

## 2021-03-05 DIAGNOSIS — Z8249 Family history of ischemic heart disease and other diseases of the circulatory system: Secondary | ICD-10-CM | POA: Diagnosis not present

## 2021-03-05 DIAGNOSIS — K219 Gastro-esophageal reflux disease without esophagitis: Secondary | ICD-10-CM | POA: Diagnosis present

## 2021-03-05 DIAGNOSIS — Z888 Allergy status to other drugs, medicaments and biological substances status: Secondary | ICD-10-CM

## 2021-03-05 DIAGNOSIS — J440 Chronic obstructive pulmonary disease with acute lower respiratory infection: Secondary | ICD-10-CM | POA: Diagnosis present

## 2021-03-05 DIAGNOSIS — Z9842 Cataract extraction status, left eye: Secondary | ICD-10-CM

## 2021-03-05 DIAGNOSIS — Z9841 Cataract extraction status, right eye: Secondary | ICD-10-CM

## 2021-03-05 DIAGNOSIS — Z7984 Long term (current) use of oral hypoglycemic drugs: Secondary | ICD-10-CM

## 2021-03-05 DIAGNOSIS — E785 Hyperlipidemia, unspecified: Secondary | ICD-10-CM | POA: Diagnosis present

## 2021-03-05 DIAGNOSIS — J449 Chronic obstructive pulmonary disease, unspecified: Secondary | ICD-10-CM | POA: Diagnosis present

## 2021-03-05 DIAGNOSIS — Z7951 Long term (current) use of inhaled steroids: Secondary | ICD-10-CM

## 2021-03-05 DIAGNOSIS — E118 Type 2 diabetes mellitus with unspecified complications: Secondary | ICD-10-CM | POA: Diagnosis not present

## 2021-03-05 DIAGNOSIS — Z20822 Contact with and (suspected) exposure to covid-19: Secondary | ICD-10-CM | POA: Diagnosis present

## 2021-03-05 DIAGNOSIS — I10 Essential (primary) hypertension: Secondary | ICD-10-CM | POA: Diagnosis present

## 2021-03-05 DIAGNOSIS — J441 Chronic obstructive pulmonary disease with (acute) exacerbation: Secondary | ICD-10-CM | POA: Diagnosis present

## 2021-03-05 DIAGNOSIS — Z950 Presence of cardiac pacemaker: Secondary | ICD-10-CM

## 2021-03-05 DIAGNOSIS — Z833 Family history of diabetes mellitus: Secondary | ICD-10-CM

## 2021-03-05 DIAGNOSIS — I5032 Chronic diastolic (congestive) heart failure: Secondary | ICD-10-CM | POA: Diagnosis present

## 2021-03-05 DIAGNOSIS — N400 Enlarged prostate without lower urinary tract symptoms: Secondary | ICD-10-CM | POA: Diagnosis present

## 2021-03-05 DIAGNOSIS — Z7982 Long term (current) use of aspirin: Secondary | ICD-10-CM

## 2021-03-05 DIAGNOSIS — Z961 Presence of intraocular lens: Secondary | ICD-10-CM | POA: Diagnosis present

## 2021-03-05 DIAGNOSIS — Z79899 Other long term (current) drug therapy: Secondary | ICD-10-CM

## 2021-03-05 DIAGNOSIS — R0602 Shortness of breath: Secondary | ICD-10-CM

## 2021-03-05 DIAGNOSIS — Z803 Family history of malignant neoplasm of breast: Secondary | ICD-10-CM

## 2021-03-05 DIAGNOSIS — E119 Type 2 diabetes mellitus without complications: Secondary | ICD-10-CM

## 2021-03-05 DIAGNOSIS — I4510 Unspecified right bundle-branch block: Secondary | ICD-10-CM | POA: Diagnosis present

## 2021-03-05 LAB — CBC WITH DIFFERENTIAL/PLATELET
Abs Immature Granulocytes: 0.03 10*3/uL (ref 0.00–0.07)
Basophils Absolute: 0 10*3/uL (ref 0.0–0.1)
Basophils Relative: 0 %
Eosinophils Absolute: 0.1 10*3/uL (ref 0.0–0.5)
Eosinophils Relative: 1 %
HCT: 41.7 % (ref 39.0–52.0)
Hemoglobin: 13.5 g/dL (ref 13.0–17.0)
Immature Granulocytes: 0 %
Lymphocytes Relative: 18 %
Lymphs Abs: 1.5 10*3/uL (ref 0.7–4.0)
MCH: 30.5 pg (ref 26.0–34.0)
MCHC: 32.4 g/dL (ref 30.0–36.0)
MCV: 94.3 fL (ref 80.0–100.0)
Monocytes Absolute: 0.5 10*3/uL (ref 0.1–1.0)
Monocytes Relative: 6 %
Neutro Abs: 6.4 10*3/uL (ref 1.7–7.7)
Neutrophils Relative %: 75 %
Platelets: 202 10*3/uL (ref 150–400)
RBC: 4.42 MIL/uL (ref 4.22–5.81)
RDW: 13.7 % (ref 11.5–15.5)
WBC: 8.5 10*3/uL (ref 4.0–10.5)
nRBC: 0 % (ref 0.0–0.2)

## 2021-03-05 LAB — BASIC METABOLIC PANEL
Anion gap: 6 (ref 5–15)
Anion gap: 5 (ref 5–15)
BUN: 15 mg/dL (ref 8–23)
BUN: 18 mg/dL (ref 8–23)
CO2: 31 mmol/L (ref 22–32)
CO2: 31 mmol/L (ref 22–32)
Calcium: 8.6 mg/dL — ABNORMAL LOW (ref 8.9–10.3)
Calcium: 8.7 mg/dL — ABNORMAL LOW (ref 8.9–10.3)
Chloride: 103 mmol/L (ref 98–111)
Chloride: 102 mmol/L (ref 98–111)
Creatinine, Ser: 0.83 mg/dL (ref 0.61–1.24)
Creatinine, Ser: 0.9 mg/dL (ref 0.61–1.24)
GFR, Estimated: >60 mL/min (ref >60)
GFR, Estimated: >60 mL/min (ref >60)
Glucose, Bld: 185 mg/dL — ABNORMAL HIGH (ref 70–99)
Glucose, Bld: 182 mg/dL — ABNORMAL HIGH (ref 70–99)
Potassium: 4.2 mmol/L (ref 3.5–5.1)
Potassium: 4.3 mmol/L (ref 3.5–5.1)
Sodium: 140 mmol/L (ref 135–145)
Sodium: 138 mmol/L (ref 135–145)

## 2021-03-05 LAB — EXPECTORATED SPUTUM ASSESSMENT W GRAM STAIN, RFLX TO RESP C

## 2021-03-05 LAB — STREP PNEUMONIAE URINARY ANTIGEN: Strep Pneumo Urinary Antigen: NEGATIVE

## 2021-03-05 LAB — TROPONIN I (HIGH SENSITIVITY)
Troponin I (High Sensitivity): 7 ng/L (ref <18)
Troponin I (High Sensitivity): 5 ng/L (ref <18)

## 2021-03-05 LAB — CBC
HCT: 42.8 % (ref 39.0–52.0)
Hemoglobin: 14.2 g/dL (ref 13.0–17.0)
MCH: 30.7 pg (ref 26.0–34.0)
MCHC: 33.2 g/dL (ref 30.0–36.0)
MCV: 92.6 fL (ref 80.0–100.0)
Platelets: 188 10*3/uL (ref 150–400)
RBC: 4.62 MIL/uL (ref 4.22–5.81)
RDW: 13.9 % (ref 11.5–15.5)
WBC: 9.1 10*3/uL (ref 4.0–10.5)
nRBC: 0 % (ref 0.0–0.2)

## 2021-03-05 LAB — COMPREHENSIVE METABOLIC PANEL
ALT: 13 U/L (ref 0–44)
AST: 18 U/L (ref 15–41)
Albumin: 3.5 g/dL (ref 3.5–5.0)
Alkaline Phosphatase: 82 U/L (ref 38–126)
Anion gap: 9 (ref 5–15)
BUN: 16 mg/dL (ref 8–23)
CO2: 30 mmol/L (ref 22–32)
Calcium: 8.5 mg/dL — ABNORMAL LOW (ref 8.9–10.3)
Chloride: 100 mmol/L (ref 98–111)
Creatinine, Ser: 0.94 mg/dL (ref 0.61–1.24)
GFR, Estimated: >60 mL/min (ref >60)
Glucose, Bld: 157 mg/dL — ABNORMAL HIGH (ref 70–99)
Potassium: 4.1 mmol/L (ref 3.5–5.1)
Sodium: 139 mmol/L (ref 135–145)
Total Bilirubin: 0.9 mg/dL (ref 0.3–1.2)
Total Protein: 6.8 g/dL (ref 6.5–8.1)

## 2021-03-05 LAB — CBG MONITORING, ED
Glucose-Capillary: 194 mg/dL — ABNORMAL HIGH (ref 70–99)
Glucose-Capillary: 129 mg/dL — ABNORMAL HIGH (ref 70–99)
Glucose-Capillary: 203 mg/dL — ABNORMAL HIGH (ref 70–99)
Glucose-Capillary: 171 mg/dL — ABNORMAL HIGH (ref 70–99)

## 2021-03-05 LAB — SARS CORONAVIRUS 2 (TAT 6-24 HRS): SARS Coronavirus 2: NEGATIVE

## 2021-03-05 LAB — HEMOGLOBIN A1C
Hgb A1c MFr Bld: 5.9 % — ABNORMAL HIGH (ref 4.8–5.6)
Mean Plasma Glucose: 123 mg/dL

## 2021-03-05 LAB — GLUCOSE, CAPILLARY
Glucose-Capillary: 194 mg/dL — ABNORMAL HIGH (ref 70–99)
Glucose-Capillary: 209 mg/dL — ABNORMAL HIGH (ref 70–99)

## 2021-03-05 LAB — BRAIN NATRIURETIC PEPTIDE: B Natriuretic Peptide: 146.1 pg/mL — ABNORMAL HIGH (ref 0.0–100.0)

## 2021-03-05 LAB — PROCALCITONIN: Procalcitonin: <0.1 ng/mL

## 2021-03-05 LAB — MAGNESIUM: Magnesium: 2 mg/dL (ref 1.7–2.4)

## 2021-03-05 MED ORDER — ATORVASTATIN CALCIUM 10 MG PO TABS
20.0000 mg | ORAL_TABLET | Freq: Every day | ORAL | Status: DC
  Administered 2021-03-05 – 2021-03-10 (×6): 20 mg via ORAL
  Filled 2021-03-05 (×6): qty 2

## 2021-03-05 MED ORDER — SODIUM CHLORIDE 0.9 % IV SOLN
2.0000 g | INTRAVENOUS | Status: DC
  Administered 2021-03-06 – 2021-03-10 (×6): 2 g via INTRAVENOUS
  Filled 2021-03-05 (×6): qty 20

## 2021-03-05 MED ORDER — SODIUM CHLORIDE 0.9 % IV SOLN
100.0000 mg | Freq: Two times a day (BID) | INTRAVENOUS | Status: DC
  Administered 2021-03-05 – 2021-03-06 (×2): 100 mg via INTRAVENOUS
  Filled 2021-03-05 (×5): qty 100

## 2021-03-05 MED ORDER — SODIUM CHLORIDE 0.9 % IV SOLN
500.0000 mg | Freq: Once | INTRAVENOUS | Status: AC
  Administered 2021-03-05: 500 mg via INTRAVENOUS
  Filled 2021-03-05: qty 500

## 2021-03-05 MED ORDER — FUROSEMIDE 40 MG PO TABS
40.0000 mg | ORAL_TABLET | ORAL | Status: DC
  Administered 2021-03-06: 40 mg via ORAL
  Filled 2021-03-05: qty 1

## 2021-03-05 MED ORDER — ACETAMINOPHEN 650 MG RE SUPP: 650.0000 mg | Freq: Four times a day (QID) | RECTAL | Status: DC | PRN

## 2021-03-05 MED ORDER — ONDANSETRON HCL 4 MG PO TABS: 4.0000 mg | ORAL_TABLET | Freq: Four times a day (QID) | ORAL | Status: DC | PRN

## 2021-03-05 MED ORDER — SODIUM CHLORIDE 0.9 % IV SOLN
2.0000 g | Freq: Once | INTRAVENOUS | Status: AC
  Administered 2021-03-05: 2 g via INTRAVENOUS
  Filled 2021-03-05: qty 20

## 2021-03-05 MED ORDER — BUDESONIDE 0.5 MG/2ML IN SUSP
0.5000 mg | Freq: Two times a day (BID) | RESPIRATORY_TRACT | Status: DC
  Administered 2021-03-05 – 2021-03-11 (×13): 0.5 mg via RESPIRATORY_TRACT
  Filled 2021-03-05 (×17): qty 2

## 2021-03-05 MED ORDER — SODIUM CHLORIDE 0.9 % IV SOLN
500.0000 mg | INTRAVENOUS | Status: DC
  Filled 2021-03-05: qty 500

## 2021-03-05 MED ORDER — ACETAMINOPHEN 325 MG PO TABS
650.0000 mg | ORAL_TABLET | Freq: Four times a day (QID) | ORAL | Status: DC | PRN
  Administered 2021-03-05 – 2021-03-08 (×4): 650 mg via ORAL
  Filled 2021-03-05 (×4): qty 2

## 2021-03-05 MED ORDER — PANTOPRAZOLE SODIUM 40 MG PO TBEC
40.0000 mg | DELAYED_RELEASE_TABLET | Freq: Every day | ORAL | Status: DC
  Administered 2021-03-05 – 2021-03-11 (×7): 40 mg via ORAL
  Filled 2021-03-05 (×7): qty 1

## 2021-03-05 MED ORDER — ONDANSETRON HCL 4 MG/2ML IJ SOLN: 4.0000 mg | Freq: Four times a day (QID) | INTRAMUSCULAR | Status: DC | PRN

## 2021-03-05 MED ORDER — INSULIN ASPART 100 UNIT/ML IJ SOLN
0.0000 [IU] | INTRAMUSCULAR | Status: DC
  Administered 2021-03-05: 3 [IU] via SUBCUTANEOUS
  Administered 2021-03-05: 1 [IU] via SUBCUTANEOUS
  Administered 2021-03-05: 5 [IU] via SUBCUTANEOUS
  Administered 2021-03-05 (×2): 2 [IU] via SUBCUTANEOUS
  Administered 2021-03-06: 1 [IU] via SUBCUTANEOUS
  Administered 2021-03-06: 2 [IU] via SUBCUTANEOUS
  Administered 2021-03-06: 3 [IU] via SUBCUTANEOUS
  Administered 2021-03-06: 1 [IU] via SUBCUTANEOUS

## 2021-03-05 MED ORDER — SENNOSIDES-DOCUSATE SODIUM 8.6-50 MG PO TABS
1.0000 | ORAL_TABLET | Freq: Every evening | ORAL | Status: DC | PRN
  Administered 2021-03-07 – 2021-03-08 (×2): 1 via ORAL
  Filled 2021-03-05 (×2): qty 1

## 2021-03-05 MED ORDER — FUROSEMIDE 80 MG PO TABS
80.0000 mg | ORAL_TABLET | Freq: Every day | ORAL | Status: DC
  Administered 2021-03-05 – 2021-03-08 (×4): 80 mg via ORAL
  Filled 2021-03-05: qty 1
  Filled 2021-03-05: qty 4
  Filled 2021-03-05 (×2): qty 1

## 2021-03-05 MED ORDER — AMLODIPINE BESYLATE 10 MG PO TABS
10.0000 mg | ORAL_TABLET | Freq: Every day | ORAL | Status: DC
  Administered 2021-03-05 – 2021-03-11 (×7): 10 mg via ORAL
  Filled 2021-03-05 (×6): qty 1
  Filled 2021-03-05: qty 2

## 2021-03-05 MED ORDER — ASPIRIN 81 MG PO CHEW
81.0000 mg | CHEWABLE_TABLET | Freq: Every morning | ORAL | Status: DC
  Administered 2021-03-05 – 2021-03-11 (×7): 81 mg via ORAL
  Filled 2021-03-05 (×5): qty 1

## 2021-03-05 MED ORDER — ENOXAPARIN SODIUM 40 MG/0.4ML IJ SOSY
40.0000 mg | PREFILLED_SYRINGE | INTRAMUSCULAR | Status: DC
  Administered 2021-03-05 – 2021-03-10 (×6): 40 mg via SUBCUTANEOUS
  Filled 2021-03-05 (×6): qty 0.4

## 2021-03-05 MED ORDER — TAMSULOSIN HCL 0.4 MG PO CAPS
0.4000 mg | ORAL_CAPSULE | Freq: Every day | ORAL | Status: DC
  Administered 2021-03-05 – 2021-03-10 (×6): 0.4 mg via ORAL
  Filled 2021-03-05 (×6): qty 1

## 2021-03-05 MED ORDER — CARVEDILOL 25 MG PO TABS
25.0000 mg | ORAL_TABLET | Freq: Two times a day (BID) | ORAL | Status: DC
  Administered 2021-03-05 – 2021-03-11 (×13): 25 mg via ORAL
  Filled 2021-03-05 (×4): qty 1
  Filled 2021-03-05: qty 2
  Filled 2021-03-05 (×6): qty 1
  Filled 2021-03-05: qty 2
  Filled 2021-03-05: qty 1

## 2021-03-05 MED ORDER — LOSARTAN POTASSIUM 50 MG PO TABS
50.0000 mg | ORAL_TABLET | Freq: Every day | ORAL | Status: DC
  Administered 2021-03-05 – 2021-03-11 (×7): 50 mg via ORAL
  Filled 2021-03-05 (×7): qty 1

## 2021-03-05 MED ORDER — IPRATROPIUM-ALBUTEROL 0.5-2.5 (3) MG/3ML IN SOLN
3.0000 mL | RESPIRATORY_TRACT | Status: DC | PRN
  Filled 2021-03-05: qty 3

## 2021-03-05 NOTE — ED Triage Notes (Signed)
The pt arrived by gems from home shortness of breath since yesteerday am.  Hx of copd and chf.  The pt is on home 02 at 3 liters/  No chest pain   Ems arrived found the pts pulse ox to be 73%  Long 02 tubing.   They  Gave the pt sl nitro x 3  Solu-medrol 125mg  iv per ems.  Placed on bi=pap on arrival  The pt does not feel like the bi-pap is helping but the pts sats are at 945

## 2021-03-05 NOTE — Progress Notes (Signed)
Bradley Searles Sr.  Code Status: FULL  Bradley Searles Sr. is a 80 y.o. male patient admitted from ED awake, alert - oriented X4 - no acute distress noted. Orientation to room and floor completed with information packet given to patient. Fall assessment complete, with patient able to verbalize understanding of risk associated with falls, and verbalized understanding to call nursing before up out of bed. Call light within reach, patient able to voice, and demonstrate understanding. Skin, clean-dry- intact without evidence of bruising, or skin tears.  No evidence of skin break down noted on exam.  ?  Will cont to eval and treat per MD orders.  Jon Gills, RN  03/05/2021

## 2021-03-05 NOTE — Significant Event (Signed)
Patient had a 5 beat run of V. tach.  Discussed with pharmacy plan is to discontinue Zithromax and change to doxycycline.  We will also check magnesium and potassium levels.  Patient is asymptomatic.  Bradley [name]

## 2021-03-05 NOTE — Telephone Encounter (Signed)
RN returned call to patients grandson. Grandson just wanted Dr.Taylor to be aware patient is in the emergency room due to breathing difficulty. Grandson lives out of town and unsure which ER he is in. RN advised I would update Dr. Ladona Ridgel and RN.

## 2021-03-05 NOTE — ED Provider Notes (Signed)
[facility] EMERGENCY DEPARTMENT Provider Note   CSN: [id number] Arrival date & time: 03/05/21  0030     History Chief Complaint  Patient presents with  . Shortness of Breath    Bradley NAVAL Sr. is a 80 y.o. male.   Shortness of Breath Severity:  Severe Onset quality:  Gradual Duration:  2 days Timing:  Constant Progression:  Worsening Chronicity:  Recurrent Context: not activity and not animal exposure   Relieved by:  None tried Worsened by:  Nothing Ineffective treatments:  None tried Associated symptoms: cough   Associated symptoms: no abdominal pain, no chest pain, no fever and no hemoptysis        Past Medical History:  Diagnosis Date  . Abnormal CT scan, chest 2012   CT chest, several lymphadenopathies. Sees pulmonary  . Anemia    intermittent  . Arthritis    "?back" (09/19/2018)  . Atrial fibrillation (HCC)    s/p AV node ablation & BiV PPM implantation 09/08/11 (op dictation pending)  . BPH (benign prostatic hyperplasia)    Saw Dr [name] 2004, normal renal u/s  . Bronchitis 08/26/2017  . CAD (coronary artery disease)   . CHF (congestive heart failure) (HCC)    Thought primarily to be non-systolic although EF down (EF 08-65% 12/2010, down to 35-40% 09/05/11), cath 2008 with no CAD, nuclear study 07/2011 showing Small area of reversibility in the distal ant/lat wall the left ventricle suspicious for ischemia/septal wall HK but felt to be low risk  (per D/C Summary 07/2011)  . Chronic bronchitis (HCC)    "get it ~ q yr"  . Heart murmur   . HLD (hyperlipidemia)   . HTN (hypertension)   . 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 Insomnia   . Migraines    "very very rare"  . On home oxygen therapy    "2L; 24/7" (09/19/2018)  . Pacemaker   . Peripheral vascular disease (HCC)    ??  . Pleural effusion 2008   S/p decortication  . Pulmonary HTN (HCC)    per cath 2008  . Type II diabetes mellitus (HCC)  1999    Patient Active Problem List   Diagnosis Date Noted  . Cor pulmonale (chronic) (HCC) 08/05/2020  . CAP (community acquired pneumonia) 09/13/2019  . COPD with acute exacerbation (HCC) 12/22/2018  . COPD GOLD 0 spirometry/ AB  12/22/2018  . PCP NOTES >>> 07/15/2015  . Annual physical exam 05/08/2012  . 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 Lymphadenopathy 09/23/2011  . Sinoatrial node dysfunction (HCC)   . 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 Chronic diastolic (congestive) heart failure (HCC)   . PAD (peripheral artery disease) (HCC) 10/22/2010  . DERMATITIS, STASIS 09/14/2010  . Chronic respiratory failure with hypoxia and hypercapnia (HCC) 09/14/2010  . Chronic pulmonary hypertension secondary to elevated L H pressures  04/15/2009  . ERECTILE DYSFUNCTION 01/06/2009  . Hyperlipidemia 12/13/2007  . Essential hypertension 12/13/2007  . Anxiety--insomnia 08/14/2007  . DM II (diabetes mellitus, type II), controlled (HCC) 01/02/2007  . Permanent atrial fibrillation 01/02/2007  . Benign enlargement of prostate 01/02/2007    Past Surgical History:  Procedure Laterality Date  . BI-VENTRICULAR PACEMAKER INSERTION Left 09/08/2011   Procedure: BI-VENTRICULAR PACEMAKER INSERTION (CRT-P);  Surgeon: Marinus Maw, MD;  Location: [facility] CATH LAB;  Service: Cardiovascular;  Laterality: Left;  . CARDIAC CATHETERIZATION     "couple times; never had balloon or stent" (09/19/2018)  . CATARACT EXTRACTION W/ INTRAOCULAR  LENS  IMPLANT, BILATERAL Bilateral 08/2018  . COLONOSCOPY  03/10/11   normal  . INSERT / REPLACE / REMOVE PACEMAKER  09/08/11   pacemaker placement  . LUNG DECORTICATION    . PLEURAL SCARIFICATION    . pneumothorax with fibrothorax  ~ 2010  . TONSILLECTOMY     "as a kid"        Family History  Problem Relation Age of Onset  . Diabetes Father   . Coronary artery disease Father   . Polycythemia Mother   . Drug abuse Son   . Breast cancer Maternal Aunt   .  Prostate cancer Neg Hx   . Colon cancer Neg Hx     Social History   Tobacco Use  . Smoking status: Former Smoker    Packs/day: 2.50    Years: 25.00    Pack years: 62.50    Types: Cigarettes, Cigars    Quit date: 10/11/1980    Years since quitting: 40.4  . Smokeless tobacco: Never Used  Vaping Use  . Vaping Use: Never used  Substance Use Topics  . Alcohol use: Not Currently    Comment: 1 can of beer ever 4-5 months  . Drug use: No    Home Medications Prior to Admission medications   Medication Sig Start Date End Date Taking? Authorizing Provider  acetaminophen (TYLENOL) 500 MG tablet Take 500 mg by mouth every 6 (six) hours as needed for headache.    [provider]  amLODipine (NORVASC) 10 MG tablet Take 1 tablet (10 mg total) by mouth daily. 09/15/20   [name], MD  aspirin 81 MG tablet Take 81 mg by mouth every morning.     [provider]  atorvastatin (LIPITOR) 20 MG tablet Take 1 tablet (20 mg total) by mouth at bedtime. 09/15/20   [name], MD  budesonide (PULMICORT) 0.5 MG/2ML nebulizer solution Take 2 mLs (0.5 mg total) by nebulization 2 (two) times daily. 11/26/19   [name], MD  calcium-vitamin D (OSCAL WITH D) 500-200 MG-UNIT tablet Take 1 tablet by mouth every morning.    [provider]  carvedilol (COREG) 25 MG tablet Take 1 tablet (25 mg total) by mouth 2 (two) times daily with a meal. 11/24/20   Paz, Nolon Rod, MD  diphenhydramine-acetaminophen (TYLENOL PM EXTRA STRENGTH) 25-500 MG TABS tablet Take 1 tablet by mouth at bedtime. 05/31/19   [name], MD  furosemide (LASIX) 40 MG tablet TAKE 2 TABLETS DAILY EXCEPT ON MONDAY, WEDNESDAY, AND FRIDAY TAKE 2 TABLETS IN THE MORNING A 1 TABLET IN THE AFTERNOON 05/21/20   [name], MD  ipratropium-albuterol (DUONEB) 0.5-2.5 (3) MG/3ML SOLN Take 3 mLs by nebulization 2 (two) times daily as needed. 11/26/19   [name], MD  Lancets [facility] ULTRASOFT) lancets Check blood sugars no more than  twice daily 03/08/17   [name], MD  losartan (COZAAR) 50 MG tablet Take 1 tablet (50 mg total) by mouth daily. 09/22/20   [name], MD  Multiple Vitamins-Minerals (MULTIVITAMINS THER. W/MINERALS) TABS tablet Take 1 tablet by mouth daily. 09/14/13   [name], MD  nitroGLYCERIN (NITROSTAT) 0.4 MG SL tablet Place 1 tablet (0.4 mg total) under the tongue every 5 (five) minutes x 3 doses as needed for chest pain. 01/10/19   [name], MD  [facility] VERIO test strip USE TO CHECK BLOOD SUGAR NO MORE THAN TWICE A DAY 02/09/21   [name], MD  pantoprazole (PROTONIX) 40 MG tablet Take 40 mg by mouth daily.    [provider]  sitaGLIPtin (JANUVIA) 100 MG tablet Take 1 tablet (100 mg total) by mouth daily. 01/27/21   Wanda PlumpPaz, Jose E, MD  tamsulosin (FLOMAX) 0.4 MG CAPS capsule Take 1 capsule (0.4 mg total) by mouth daily after supper. 06/09/20   Wanda PlumpPaz, Jose E, MD    Allergies    Hydrocodone, Tramadol, Tadalafil, and Avelox [moxifloxacin hydrochloride]  Review of Systems   Review of Systems  Constitutional: Negative for fever.  Respiratory: Positive for cough and shortness of breath. Negative for hemoptysis.   Cardiovascular: Negative for chest pain.  Gastrointestinal: Negative for abdominal pain.  All other systems reviewed and are negative.   Physical Exam Updated Vital Signs There were no vitals taken for this visit.  Physical Exam Vitals and nursing note reviewed.  Constitutional:      Appearance: He is well-developed.  HENT:     Head: Normocephalic and atraumatic.     Nose: Nose normal. No congestion or rhinorrhea.     Mouth/Throat:     Mouth: Mucous membranes are moist.     Pharynx: Oropharynx is clear.  Eyes:     Pupils: Pupils are equal, round, and reactive to light.  Cardiovascular:     Rate and Rhythm: Normal rate.  Pulmonary:     Effort: Pulmonary effort is normal. No respiratory distress.     Breath sounds: Decreased breath sounds and wheezing present.  Abdominal:      General: There is no distension.  Musculoskeletal:        General: Normal range of motion.     Cervical back: Normal range of motion.  Skin:    General: Skin is warm and dry.  Neurological:     General: No focal deficit present.     Mental Status: He is alert.  Psychiatric:        Mood and Affect: Mood normal.     ED Results / Procedures / Treatments   Labs (all labs ordered are listed, but only abnormal results are displayed) Labs Reviewed  COMPREHENSIVE METABOLIC PANEL - Abnormal; Notable for the following components:      Result Value   Glucose, Bld 157 (*)    Calcium 8.5 (*)    All other components within normal limits  BRAIN NATRIURETIC PEPTIDE - Abnormal; Notable for the following components:   B Natriuretic Peptide 146.1 (*)    All other components within normal limits  HEMOGLOBIN A1C - Abnormal; Notable for the following components:   Hgb A1c MFr Bld 5.9 (*)    All other components within normal limits  BASIC METABOLIC PANEL - Abnormal; Notable for the following components:   Glucose, Bld 185 (*)    Calcium 8.6 (*)    All other components within normal limits  BASIC METABOLIC PANEL - Abnormal; Notable for the following components:   Glucose, Bld 133 (*)    Calcium 8.6 (*)    Anion gap 4 (*)    All other components within normal limits  CBC - Abnormal; Notable for the following components:   RBC 3.99 (*)    Hemoglobin 12.4 (*)    HCT 37.0 (*)    All other components within normal limits  GLUCOSE, CAPILLARY - Abnormal; Notable for the following components:   Glucose-Capillary 194 (*)    All other components within normal limits  BASIC METABOLIC PANEL - Abnormal; Notable for the following components:   Glucose, Bld 182 (*)  Calcium 8.7 (*)    All other components within normal limits  GLUCOSE, CAPILLARY - Abnormal; Notable for the following components:   Glucose-Capillary 209 (*)    All other components within normal limits  GLUCOSE, CAPILLARY -  Abnormal; Notable for the following components:   Glucose-Capillary 128 (*)    All other components within normal limits  GLUCOSE, CAPILLARY - Abnormal; Notable for the following components:   Glucose-Capillary 132 (*)    All other components within normal limits  GLUCOSE, CAPILLARY - Abnormal; Notable for the following components:   Glucose-Capillary 174 (*)    All other components within normal limits  GLUCOSE, CAPILLARY - Abnormal; Notable for the following components:   Glucose-Capillary 139 (*)    All other components within normal limits  BASIC METABOLIC PANEL - Abnormal; Notable for the following components:   CO2 33 (*)    Glucose, Bld 141 (*)    Calcium 8.2 (*)    All other components within normal limits  GLUCOSE, CAPILLARY - Abnormal; Notable for the following components:   Glucose-Capillary 177 (*)    All other components within normal limits  GLUCOSE, CAPILLARY - Abnormal; Notable for the following components:   Glucose-Capillary 115 (*)    All other components within normal limits  GLUCOSE, CAPILLARY - Abnormal; Notable for the following components:   Glucose-Capillary 131 (*)    All other components within normal limits  GLUCOSE, CAPILLARY - Abnormal; Notable for the following components:   Glucose-Capillary 176 (*)    All other components within normal limits  GLUCOSE, CAPILLARY - Abnormal; Notable for the following components:   Glucose-Capillary 146 (*)    All other components within normal limits  GLUCOSE, CAPILLARY - Abnormal; Notable for the following components:   Glucose-Capillary 131 (*)    All other components within normal limits  GLUCOSE, CAPILLARY - Abnormal; Notable for the following components:   Glucose-Capillary 198 (*)    All other components within normal limits  BASIC METABOLIC PANEL - Abnormal; Notable for the following components:   CO2 35 (*)    Glucose, Bld 195 (*)    Calcium 8.6 (*)    All other components within normal limits  CBC -  Abnormal; Notable for the following components:   RBC 3.66 (*)    Hemoglobin 11.2 (*)    HCT 33.7 (*)    All other components within normal limits  BRAIN NATRIURETIC PEPTIDE - Abnormal; Notable for the following components:   B Natriuretic Peptide 414.5 (*)    All other components within normal limits  GLUCOSE, CAPILLARY - Abnormal; Notable for the following components:   Glucose-Capillary 229 (*)    All other components within normal limits  GLUCOSE, CAPILLARY - Abnormal; Notable for the following components:   Glucose-Capillary 156 (*)    All other components within normal limits  GLUCOSE, CAPILLARY - Abnormal; Notable for the following components:   Glucose-Capillary 279 (*)    All other components within normal limits  BASIC METABOLIC PANEL - Abnormal; Notable for the following components:   Chloride 90 (*)    CO2 39 (*)    Glucose, Bld 132 (*)    Calcium 8.6 (*)    All other components within normal limits  CBC - Abnormal; Notable for the following components:   RBC 3.89 (*)    Hemoglobin 11.9 (*)    HCT 35.3 (*)    All other components within normal limits  BRAIN NATRIURETIC PEPTIDE - Abnormal; Notable for the  following components:   B Natriuretic Peptide 314.2 (*)    All other components within normal limits  GLUCOSE, CAPILLARY - Abnormal; Notable for the following components:   Glucose-Capillary 216 (*)    All other components within normal limits  GLUCOSE, CAPILLARY - Abnormal; Notable for the following components:   Glucose-Capillary 159 (*)    All other components within normal limits  GLUCOSE, CAPILLARY - Abnormal; Notable for the following components:   Glucose-Capillary 157 (*)    All other components within normal limits  GLUCOSE, CAPILLARY - Abnormal; Notable for the following components:   Glucose-Capillary 233 (*)    All other components within normal limits  GLUCOSE, CAPILLARY - Abnormal; Notable for the following components:   Glucose-Capillary 232 (*)     All other components within normal limits  GLUCOSE, CAPILLARY - Abnormal; Notable for the following components:   Glucose-Capillary 294 (*)    All other components within normal limits  CBG MONITORING, ED - Abnormal; Notable for the following components:   Glucose-Capillary 194 (*)    All other components within normal limits  CBG MONITORING, ED - Abnormal; Notable for the following components:   Glucose-Capillary 129 (*)    All other components within normal limits  CBG MONITORING, ED - Abnormal; Notable for the following components:   Glucose-Capillary 203 (*)    All other components within normal limits  CBG MONITORING, ED - Abnormal; Notable for the following components:   Glucose-Capillary 171 (*)    All other components within normal limits  EXPECTORATED SPUTUM ASSESSMENT W GRAM STAIN, RFLX TO RESP C  SARS CORONAVIRUS 2 (TAT 6-24 HRS)  CULTURE, RESPIRATORY W GRAM STAIN  MRSA PCR SCREENING  CBC WITH DIFFERENTIAL/PLATELET  PROCALCITONIN  STREP PNEUMONIAE URINARY ANTIGEN  CBC  LEGIONELLA PNEUMOPHILA SEROGP 1 UR AG  PROCALCITONIN  MAGNESIUM  MAGNESIUM  GLUCOSE, CAPILLARY  MAGNESIUM  GLUCOSE, CAPILLARY  MAGNESIUM  TROPONIN I (HIGH SENSITIVITY)  TROPONIN I (HIGH SENSITIVITY)    EKG EKG Interpretation  Date/Time:  Thursday Mar 05 2021 00:35:03 EDT Ventricular Rate:  70 PR Interval:  171 QRS Duration: 152 QT Interval:  448 QTC Calculation: 484 R Axis:   267 Text Interpretation: Sinus rhythm Right bundle branch block Confirmed by Shawnta Zimbelman, Barbara Cower [phone]) on 03/05/2021 12:41:13 AM   Radiology DG CHEST PORT 1 VIEW  Result Date: 03/09/2021 CLINICAL DATA:  Shortness of breath EXAM: PORTABLE CHEST 1 VIEW COMPARISON:  03/05/2021 FINDINGS: Multifocal patchy opacities in the lingula and bilateral lower lobes, left lower lobe predominant, suspicious for multifocal pneumonia. This is mildly progressive from the prior. No pleural effusion or pneumothorax. Cardiomegaly. Left  subclavian pacemaker. Thoracic aortic atherosclerosis. IMPRESSION: Multifocal pneumonia, left lower lobe predominant, mildly progressive. Electronically Signed   By: Charline Bills M.D.   On: 03/09/2021 08:17    Procedures .Critical Care Performed by: Marily Memos, MD Authorized by: Marily Memos, MD   Critical care provider statement:    Critical care time (minutes):  45   Critical care was necessary to treat or prevent imminent or life-threatening deterioration of the following conditions:  Respiratory failure   Critical care was time spent personally by me on the following activities:  Discussions with consultants, evaluation of patient's response to treatment, examination of patient, ordering and performing treatments and interventions, ordering and review of laboratory studies, ordering and review of radiographic studies, pulse oximetry, re-evaluation of patient's condition, obtaining history from patient or surrogate and review of old charts     Medications Ordered  in ED Medications - No data to display  ED Course  I have reviewed the triage vital signs and the nursing notes.  Pertinent labs & imaging results that were available during my care of the patient were reviewed by me and considered in my medical decision making (see chart for details).    MDM Rules/Calculators/A&P                          Hypoxic on home O2, requiring bipap here with some improvement. Getting breathing treatments. Questionable pneumonia vs CHF, abx started, will defer to hospitalist for diuresis if not improving appropriately.   Final Clinical Impression(s) / ED Diagnoses Final diagnoses:  None    Rx / DC Orders ED Discharge Orders    None       Jayli Fogleman, Barbara Cower, MD 03/11/21 [phone]

## 2021-03-05 NOTE — ED Notes (Signed)
Checked patient cbg it was 129 notified RN of blood sugar patient is resting with call bell in reach 

## 2021-03-05 NOTE — Progress Notes (Signed)
Patient trialed off BIPAP and placed on [place]. Patient tolerating well at this time.

## 2021-03-05 NOTE — Progress Notes (Signed)
PROGRESS NOTE    Bradley [name]  VPX:[id number] DOB: [birth year] DOA: 03/05/2021 PCP: Bradley Plump, MD    Brief Narrative:  Bradley [name]. is a 80 year old male with past medical history significant for persistent atrial fibrillation s/p AVN ablation with CRT-P not on anticoagulation secondary to ICH 2013, chronic diastolic congestive heart failure, COPD, chronic respiratory failure on 3 L nasal cannula at baseline, PAD, CAD presented to [facility], ED on 5/26 with progressive shortness of breath.  Onset 2-3 days ago.  Increased cough with productive sputum.  Denies any lower extremity edema.  No chest pain.  Patient was found at home to be saturating 73% on his normal home oxygen by EMS.  Patient was given nitroglycerin and Solu-Medrol, placed on CPAP and transported to the ED.  In the ED, patient was afebrile, with SPO2 90s on BiPAP.  He was tachypneic with normal blood pressure.  EKG notable for ventricular pacing.  139, potassium 4.1, chloride 100, CO2 30, glucose 157, BUN 16, creatinine 0.94.  WBC 8.5, hemoglobin 13.5, platelets 202.  BNP 146.1.  High-sensitivity troponin 7>5.  BNP 146.1. Chest x-ray concerning for left basilar focal infiltrate.  COVID-19 pending. Patient was maintained on BiPAP in the ED and started on azithromycin and ceftriaxone for community-acquired pneumonia.  Hospital service consulted for further evaluation and management of acute on chronic hypoxic respite failure secondary to CAP.   Assessment & Plan:   Principal Problem:   Acute on chronic respiratory failure with hypoxia (HCC) Active Problems:   DM II (diabetes mellitus, type II), controlled (HCC)   Essential hypertension   Chronic diastolic (congestive) heart failure (HCC)   Pneumonia   Non-ischemic cardiomyopathy (HCC)   COPD GOLD 0 spirometry/ AB    Acute on chronic hypoxic respiratory failure, POA Community-acquired pneumonia Patient presenting to the ED after being found hypoxic on his home O2  with associated progressive shortness of breath and productive cough over the last 2-3 days.  Denies any sick contacts.  Chest x-ray concerning for left lower lobe infiltrate.  Patient is afebrile without leukocytosis.  Initially requiring BiPAP, titrated off this morning.  Strep pneumo urinary antigen negative.  CT chest without contrast with fairly extensive patchy bronchopneumonia both lower lobes, left worse than right. --Sputum culture: Pending --COVID-19: pending --Check CT chest without contrast --Azithromycin 500 mg IV every 24 hours x5 days --Ceftriaxone 2 g IV every 24 hours x5 days --Spirometry, flutter valve --Supplemental oxygen, maintain SPO2 greater than 88%, on 3L baseline --Bipap prn and qHS  Persistent atrial fibrillation Patient with previous AV nodal ablation with CRT-P.  Follows with cardiology outpatient, Dr. Duke Salvia.  Not on chronic anticoagulation due to prior intracranial bleed. --Carvedilol 25 mg p.o. twice daily --Aspirin 81 mg p.o. daily  Chronic diastolic congestive heart failure --Continue carvedilol and losartan --Continue home furosemide 40 mg every Monday/Wednesday/Friday --Strict I's and O's and daily weights  COPD Patient follows with pulmonology outpatient, [name].  At baseline on 3 L nasal cannula. --Budesonide nebs twice daily --DuoNebs every 4 hours as needed shortness of breath/wheezing  Type 2 diabetes mellitus Hemoglobin A1c 6.0 on 12/09/2020.  On Januvia 100 mg p.o. daily at home. --Hold oral hypoglycemics while inpatient --SSI for coverage --CBG before every meal/at bedtime  HLD: Atorvastatin 20 mg p.o. daily  Essential hypertension BP 120/66 this morning, well controlled. --Amlodipine 10 mg p.o. daily --Carvedilol 25 mg p.o. twice daily --Losartan 50 mg p.o. daily --Continue aspirin and statin  BPH:  Tamsulosin 0.4 mg p.o. daily  GERD: Continue PPI   DVT prophylaxis: enoxaparin (LOVENOX) injection 40 mg Start: 03/05/21 1600     Code Status: Full Code Family Communication: No family present at bedside  Disposition Plan:  Level of care: Progressive Status is: Inpatient  Remains inpatient appropriate because:Ongoing diagnostic testing needed not appropriate for outpatient work up, Unsafe d/c plan, IV treatments appropriate due to intensity of illness or inability to take PO and Inpatient level of care appropriate due to severity of illness   Dispo: The patient is from: Home              Anticipated d/c is to: Home              Patient currently is not medically stable to d/c.   Difficult to place patient No  Consultants:   None  Procedures:   BiPAP  Antimicrobials:   Azithromycin 5/26>>  Ceftriaxone 5/26>>   Subjective: Patient seen examined bedside, continues in ED holding area.  Titrated off of BiPAP this morning to nasal cannula, currently on 10 L/min.  Baseline home dose of 3 L.  Continues with productive cough.  Dyspnea much improved since initial arrival, but continues with shortness of breath that is greater than his normal baseline.  No other questions or concerns at this time.  Denies headache, no fever/chills/night sweats, no nausea/vomiting/diarrhea, no chest pain, no palpitations, no abdominal pain, no weakness, no fatigue, no paresthesias.  No acute events overnight per nursing staff.  Objective: Vitals:   03/05/21 1000 03/05/21 1030 03/05/21 1200 03/05/21 1450  BP: 123/64 120/66 111/71   Pulse: 70 69 69   Resp: (!) 28 (!) 27 (!) 22   Temp:    99.1 F (37.3 C)  TempSrc:    Oral  SpO2: 99% 96% 96%   Weight:      Height:        Intake/Output Summary (Last 24 hours) at 03/05/2021 1459 Last data filed at 03/05/2021 0734 Gross per 24 hour  Intake 500 ml  Output --  Net 500 ml   Filed Weights   03/05/21 0041  Weight: 88 kg    Examination:  General exam: Appears calm and comfortable, chronically ill appearance Respiratory system: Decreased breath sounds bilateral bases,  crackles bilateral bases, normal respiratory effort, on 10 L nasal cannula Cardiovascular system: S1 & S2 heard, RRR. No JVD, murmurs, rubs, gallops or clicks. No pedal edema. Gastrointestinal system: Abdomen is nondistended, soft and nontender. No organomegaly or masses felt. Normal bowel sounds heard. Central nervous system: Alert and oriented. No focal neurological deficits. Extremities: Symmetric 5 x 5 power. Skin: No rashes, lesions or ulcers Psychiatry: Judgement and insight appear normal. Mood & affect appropriate.     Data Reviewed: I have personally reviewed following labs and imaging studies  CBC: Recent Labs  Lab 03/05/21 0103 03/05/21 0529  WBC 8.5 9.1  NEUTROABS 6.4  --   HGB 13.5 14.2  HCT 41.7 42.8  MCV 94.3 92.6  PLT 202 188   Basic Metabolic Panel: Recent Labs  Lab 03/05/21 0103 03/05/21 0529  NA 139 140  K 4.1 4.2  CL 100 103  CO2 30 31  GLUCOSE 157* 185*  BUN 16 15  CREATININE 0.94 0.83  CALCIUM 8.5* 8.6*   GFR: Estimated Creatinine Clearance: 77.9 mL/min (by C-G formula based on SCr of 0.83 mg/dL). Liver Function Tests: Recent Labs  Lab 03/05/21 0103  AST 18  ALT 13  ALKPHOS 82  BILITOT 0.9  PROT 6.8  ALBUMIN 3.5   No results for input(s): LIPASE, AMYLASE in the last 168 hours. No results for input(s): AMMONIA in the last 168 hours. Coagulation Profile: No results for input(s): INR, PROTIME in the last 168 hours. Cardiac Enzymes: No results for input(s): CKTOTAL, CKMB, CKMBINDEX, TROPONINI in the last 168 hours. BNP (last 3 results) No results for input(s): PROBNP in the last 8760 hours. HbA1C: No results for input(s): HGBA1C in the last 72 hours. CBG: Recent Labs  Lab 03/05/21 0444 03/05/21 0851 03/05/21 1150  GLUCAP 194* 129* 203*   Lipid Profile: No results for input(s): CHOL, HDL, LDLCALC, TRIG, CHOLHDL, LDLDIRECT in the last 72 hours. Thyroid Function Tests: No results for input(s): TSH, T4TOTAL, FREET4, T3FREE,  THYROIDAB in the last 72 hours. Anemia Panel: No results for input(s): VITAMINB12, FOLATE, FERRITIN, TIBC, IRON, RETICCTPCT in the last 72 hours. Sepsis Labs: Recent Labs  Lab 03/05/21 0529  PROCALCITON <0.10    No results found for this or any previous visit (from the past 240 hour(s)).       Radiology Studies: CT CHEST WO CONTRAST  Result Date: 03/05/2021 CLINICAL DATA:  Abnormal density at the left lung base by radiography. EXAM: CT CHEST WITHOUT CONTRAST TECHNIQUE: Multidetector CT imaging of the chest was performed following the standard protocol without IV contrast. COMPARISON:  Chest radiography same day.  Previous CT 09/13/2019 FINDINGS: Cardiovascular: Cardiomegaly. Coronary artery calcification. Aortic atherosclerotic calcification. Dual lead pacemaker in place. No pericardial fluid. Mediastinum/Nodes: No mediastinal or hilar mass. Mild reactive prominence of the lymph nodes. Lungs/Pleura: Patchy bronchopneumonia in both lower lobes, more extensive on the left than the right. Small amount pleural fluid on the right. Upper Abdomen: Relative elevation of the left hemidiaphragm. Upper abdomen appears negative. Musculoskeletal: Ordinary mild degenerative changes of the spine. IMPRESSION: Fairly extensive patchy bronchopneumonia in both lower lobes, left worse than right. Aortic Atherosclerosis (ICD10-I70.0). Electronically Signed   By: Paulina Fusi M.D.   On: 03/05/2021 11:22   DG Chest Portable 1 View  Result Date: 03/05/2021 CLINICAL DATA:  Dyspnea, COPD, CHF EXAM: PORTABLE CHEST 1 VIEW COMPARISON:  07/28/2020 FINDINGS: The lungs are symmetrically expanded. A focal peripheral infiltrate is seen within the a left lung base, possibly infectious or inflammatory in the acute setting. An underlying mass lesion could appear similarly. No pneumothorax or pleural effusion. Mild cardiomegaly is stable. Left subclavian pacemaker with 2 leads extending into the right ventricle are unchanged.  Pulmonary vascularity is normal. No acute bone abnormality., IMPRESSION: Left basilar focal pulmonary infiltrate, possibly infectious or inflammatory. Follow-up chest radiograph is recommended in 3-4 weeks to document resolution. Electronically Signed   By: Helyn Numbers MD   On: 03/05/2021 01:18        Scheduled Meds: . amLODipine  10 mg Oral Daily  . aspirin  81 mg Oral q morning  . atorvastatin  20 mg Oral QHS  . budesonide  0.5 mg Nebulization BID  . carvedilol  25 mg Oral BID WC  . enoxaparin (LOVENOX) injection  40 mg Subcutaneous Q24H  . [START ON 03/06/2021] furosemide  40 mg Oral Q M,W,F-1800  . furosemide  80 mg Oral Daily  . insulin aspart  0-9 Units Subcutaneous Q4H  . losartan  50 mg Oral Daily  . pantoprazole  40 mg Oral Daily  . tamsulosin  0.4 mg Oral QPC supper   Continuous Infusions: . [START ON 03/06/2021] azithromycin    . [START ON 03/06/2021] cefTRIAXone (ROCEPHIN)  IV       LOS: 0 days    Time spent: 39 minutes spent on chart review, discussion with nursing staff, consultants, updating family and interview/physical exam; more than 50% of that time was spent in counseling and/or coordination of care.    Alvira Philips [place], DO [facility] Available via Epic secure chat 7am-7pm After these hours, please refer to coverage provider listed on [email or web address] 03/05/2021, 2:59 PM

## 2021-03-05 NOTE — Progress Notes (Signed)
Patient placed on BIPAP due to increased WOB and oxygen saturation of 80%. Per EMS patient did not tolerate CPAP. Patient tolerating BIPAP well at this time.

## 2021-03-05 NOTE — H&P (Signed)
History and Physical    Bradley [name] VHQ:[id number] DOB: [birth year] DOA: 03/05/2021  PCP: [name], MD   Patient coming from: Home   Chief Complaint: SOB    HPI: Bradley TONNE Sr. is a 80 y.o. male with medical history significant for atrial fibrillation not anticoagulated due to intracerebral hemorrhage in 2013, chronic diastolic CHF, COPD, status post PPM insertion and AV node ablation in 2012, and chronic hypoxic and hypercarbic respiratory failure, now presenting to emergency department for evaluation of shortness of breath.  Patient reports 2 or 3 days of worsening shortness of breath.  He has had increase in his chronic cough, occasionally productive of sputum.  He denies any leg swelling or tenderness and denies any chest pain.  Symptoms continue to worsen and EMS was called.  Patient was found to be saturating 73% at home, was treated with nitroglycerin and Solu-Medrol, placed on CPAP, and transported to the ED.  ED Course: Upon arrival to the ED, patient is found to be afebrile, saturating 90s on BiPAP, tachypneic, and with stable blood pressure.  EKG features ventricular pacing.  Chest x-ray concerning for left basilar focal infiltrate.  Chemistry panel and CBC unremarkable.  Troponin is normal x2.  BNP slightly elevated.  Patient was kept on BiPAP in the ED and treated with Rocephin and azithromycin.  Review of Systems:  All other systems reviewed and apart from HPI, are negative.  Past Medical History:  Diagnosis Date  . Abnormal CT scan, chest 2012   CT chest, several lymphadenopathies. Sees pulmonary  . Anemia    intermittent  . Arthritis    "?back" (09/19/2018)  . Atrial fibrillation (HCC)    s/p AV node ablation & BiV PPM implantation 09/08/11 (op dictation pending)  . BPH (benign prostatic hyperplasia)    Saw Dr [name] 2004, normal renal u/s  . Bronchitis 08/26/2017  . CAD (coronary artery disease)   . CHF (congestive heart failure) (HCC)    Thought  primarily to be non-systolic although EF down (EF 41-32% 12/2010, down to 35-40% 09/05/11), cath 2008 with no CAD, nuclear study 07/2011 showing Small area of reversibility in the distal ant/lat wall the left ventricle suspicious for ischemia/septal wall HK but felt to be low risk  (per D/C Summary 07/2011)  . Chronic bronchitis (HCC)    "get it ~ q yr"  . Heart murmur   . HLD (hyperlipidemia)   . HTN (hypertension)   . 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 Insomnia   . Migraines    "very very rare"  . On home oxygen therapy    "2L; 24/7" (09/19/2018)  . Pacemaker   . Peripheral vascular disease (HCC)    ??  . Pleural effusion 2008   S/p decortication  . Pulmonary HTN (HCC)    per cath 2008  . Type II diabetes mellitus (HCC) 1999    Past Surgical History:  Procedure Laterality Date  . BI-VENTRICULAR PACEMAKER INSERTION Left 09/08/2011   Procedure: BI-VENTRICULAR PACEMAKER INSERTION (CRT-P);  Surgeon: Marinus Maw, MD;  Location: [facility] CATH LAB;  Service: Cardiovascular;  Laterality: Left;  . CARDIAC CATHETERIZATION     "couple times; never had balloon or stent" (09/19/2018)  . CATARACT EXTRACTION W/ INTRAOCULAR LENS  IMPLANT, BILATERAL Bilateral 08/2018  . COLONOSCOPY  03/10/11   normal  . INSERT / REPLACE / REMOVE PACEMAKER  09/08/11   pacemaker placement  . LUNG DECORTICATION    . PLEURAL  SCARIFICATION    . pneumothorax with fibrothorax  ~ 2010  . TONSILLECTOMY     "as a kid"     Social History:   reports that he quit smoking about 40 years ago. His smoking use included cigarettes and cigars. He has a 62.50 pack-year smoking history. He has never used smokeless tobacco. He reports previous alcohol use. He reports that he does not use drugs.  Allergies  Allergen Reactions  . Hydrocodone Other (See Comments)    "given to him in the hospital; went thru withdrawals once home; dr said not to take it again" (09/27/2012)  . Tramadol Other (See  Comments)    "given to him in the hospital; went thru withdrawals once home; dr said not to take it again" (09/27/2012)  . Tadalafil Other (See Comments)    headache, backache  . Avelox [Moxifloxacin Hydrochloride] Itching and Other (See Comments)    Headache    Family History  Problem Relation Age of Onset  . Diabetes Father   . Coronary artery disease Father   . Polycythemia Mother   . Drug abuse Son   . Breast cancer Maternal Aunt   . Prostate cancer Neg Hx   . Colon cancer Neg Hx      Prior to Admission medications   Medication Sig Start Date End Date Taking? Authorizing Provider  acetaminophen (TYLENOL) 500 MG tablet Take 500 mg by mouth every 6 (six) hours as needed for headache.   Yes [provider]  amLODipine (NORVASC) 10 MG tablet Take 1 tablet (10 mg total) by mouth daily. 09/15/20  Yes [name], MD  aspirin 81 MG tablet Take 81 mg by mouth every morning.    Yes [provider]  atorvastatin (LIPITOR) 20 MG tablet Take 1 tablet (20 mg total) by mouth at bedtime. 09/15/20  Yes Paz, Elita Quick E, MD  budesonide (PULMICORT) 0.5 MG/2ML nebulizer solution Take 2 mLs (0.5 mg total) by nebulization 2 (two) times daily. 11/26/19  Yes Paz, Nolon Rod, MD  calcium-vitamin D (OSCAL WITH D) 500-200 MG-UNIT tablet Take 1 tablet by mouth every morning.   Yes [provider]  carvedilol (COREG) 25 MG tablet Take 1 tablet (25 mg total) by mouth 2 (two) times daily with a meal. 11/24/20  Yes Paz, Jose E, MD  diphenhydramine-acetaminophen (TYLENOL PM EXTRA STRENGTH) 25-500 MG TABS tablet Take 1 tablet by mouth at bedtime. 05/31/19  Yes Paz, Nolon Rod, MD  furosemide (LASIX) 40 MG tablet TAKE 2 TABLETS DAILY EXCEPT ON MONDAY, WEDNESDAY, AND FRIDAY TAKE 2 TABLETS IN THE MORNING A 1 TABLET IN THE AFTERNOON Patient taking differently: Take 40-80 mg by mouth See admin instructions. 80 mg Tuesday,Thursday,Saturday, Sunday. 80 mg in the morning and 40 mg in the afternoon on  Monday,Wednesday, Friday 05/21/20  Yes Paz, Elita Quick E, MD  ipratropium-albuterol (DUONEB) 0.5-2.5 (3) MG/3ML SOLN Take 3 mLs by nebulization 2 (two) times daily as needed. Patient taking differently: Take 3 mLs by nebulization 2 (two) times daily as needed (shortness of breath). 11/26/19  Yes Paz, Nolon Rod, MD  Lancets [facility] ULTRASOFT) lancets Check blood sugars no more than twice daily 03/08/17  Yes Paz, Nolon Rod, MD  losartan (COZAAR) 50 MG tablet Take 1 tablet (50 mg total) by mouth daily. 09/22/20  Yes [name], MD  Multiple Vitamins-Minerals (MULTIVITAMINS THER. W/MINERALS) TABS tablet Take 1 tablet by mouth daily. 09/14/13  Yes Paz, Nolon Rod, MD  nitroGLYCERIN (NITROSTAT) 0.4 MG SL tablet Place 1  tablet (0.4 mg total) under the tongue every 5 (five) minutes x 3 doses as needed for chest pain. 01/10/19  Yes Paz, Nolon Rod, MD  [facility] VERIO test strip USE TO CHECK BLOOD SUGAR NO MORE THAN TWICE A DAY 02/09/21  Yes [name], MD  pantoprazole (PROTONIX) 40 MG tablet Take 40 mg by mouth daily.   Yes [provider]  sitaGLIPtin (JANUVIA) 100 MG tablet Take 1 tablet (100 mg total) by mouth daily. 01/27/21  Yes Paz, Nolon Rod, MD  tamsulosin (FLOMAX) 0.4 MG CAPS capsule Take 1 capsule (0.4 mg total) by mouth daily after supper. 06/09/20  Yes [name], MD    Physical Exam: Vitals:   03/05/21 0156 03/05/21 0200 03/05/21 0230 03/05/21 0353  BP: 137/72 136/74 132/81 132/73  Pulse: 70 70 69 70  Resp: (!) 34 (!) 28 (!) 29 (!) 31  Temp:      SpO2: 92% 99% 94% 93%  Weight:      Height:        Constitutional: NAD, calm  Eyes: PERTLA, lids and conjunctivae normal ENMT: Mucous membranes are moist. Posterior pharynx clear of any exudate or lesions.   Neck:  supple, no masses  Respiratory:  Tachypneic, no wheezing, no crackles. No pallor or cyanosis.  Cardiovascular: S1 & S2 heard, regular rate and rhythm. No extremity edema.   Abdomen: No distension, no tenderness, soft. Bowel sounds active.   Musculoskeletal: no clubbing / cyanosis. No joint deformity upper and lower extremities.   Skin: Hyperpigmentation involving lower legs bilaterally in gaiter distribution. Warm, dry, well-perfused. Neurologic: CN 2-12 grossly intact. Sensation intact. Moving all extremities.  Psychiatric: Alert and oriented to person, place, and situation. Pleasant and cooperative.    Labs and Imaging on Admission: I have personally reviewed following labs and imaging studies  CBC: Recent Labs  Lab 03/05/21 0103  WBC 8.5  NEUTROABS 6.4  HGB 13.5  HCT 41.7  MCV 94.3  PLT 202   Basic Metabolic Panel: Recent Labs  Lab 03/05/21 0103  NA 139  K 4.1  CL 100  CO2 30  GLUCOSE 157*  BUN 16  CREATININE 0.94  CALCIUM 8.5*   GFR: Estimated Creatinine Clearance: 68.8 mL/min (by C-G formula based on SCr of 0.94 mg/dL). Liver Function Tests: Recent Labs  Lab 03/05/21 0103  AST 18  ALT 13  ALKPHOS 82  BILITOT 0.9  PROT 6.8  ALBUMIN 3.5   No results for input(s): LIPASE, AMYLASE in the last 168 hours. No results for input(s): AMMONIA in the last 168 hours. Coagulation Profile: No results for input(s): INR, PROTIME in the last 168 hours. Cardiac Enzymes: No results for input(s): CKTOTAL, CKMB, CKMBINDEX, TROPONINI in the last 168 hours. BNP (last 3 results) No results for input(s): PROBNP in the last 8760 hours. HbA1C: No results for input(s): HGBA1C in the last 72 hours. CBG: No results for input(s): GLUCAP in the last 168 hours. Lipid Profile: No results for input(s): CHOL, HDL, LDLCALC, TRIG, CHOLHDL, LDLDIRECT in the last 72 hours. Thyroid Function Tests: No results for input(s): TSH, T4TOTAL, FREET4, T3FREE, THYROIDAB in the last 72 hours. Anemia Panel: No results for input(s): VITAMINB12, FOLATE, FERRITIN, TIBC, IRON, RETICCTPCT in the last 72 hours. Urine analysis:    Component Value Date/Time   COLORURINE YELLOW 07/15/2015 1440   APPEARANCEUR CLEAR 07/15/2015 1440   LABSPEC  1.015 07/15/2015 1440   PHURINE 6.0 07/15/2015 1440   GLUCOSEU NEGATIVE 07/15/2015 1440   HGBUR TRACE-LYSED (A)  07/15/2015 1440   BILIRUBINUR NEGATIVE 07/15/2015 1440   KETONESUR TRACE (A) 07/15/2015 1440   PROTEINUR NEGATIVE 07/06/2012 0341   UROBILINOGEN 1.0 07/15/2015 1440   NITRITE NEGATIVE 07/15/2015 1440   LEUKOCYTESUR NEGATIVE 07/15/2015 1440   Sepsis Labs: @LABRCNTIP (procalcitonin:4,lacticidven:4) )No results found for this or any previous visit (from the past 240 hour(s)).   Radiological Exams on Admission: DG Chest Portable 1 View  Result Date: 03/05/2021 CLINICAL DATA:  Dyspnea, COPD, CHF EXAM: PORTABLE CHEST 1 VIEW COMPARISON:  07/28/2020 FINDINGS: The lungs are symmetrically expanded. A focal peripheral infiltrate is seen within the a left lung base, possibly infectious or inflammatory in the acute setting. An underlying mass lesion could appear similarly. No pneumothorax or pleural effusion. Mild cardiomegaly is stable. Left subclavian pacemaker with 2 leads extending into the right ventricle are unchanged. Pulmonary vascularity is normal. No acute bone abnormality., IMPRESSION: Left basilar focal pulmonary infiltrate, possibly infectious or inflammatory. Follow-up chest radiograph is recommended in 3-4 weeks to document resolution. Electronically Signed   By: Helyn Numbers MD   On: 03/05/2021 01:18    EKG: Independently reviewed. Ventricular pacing.   Assessment/Plan   1. Pneumonia; acute on chronic hypoxic respiratory failure  - Patient with chronic 3 Lpm supplemental O2 requirement presents with 2-3 days of worsening SOB and cough and is found to have left basilar pneumonia or mass lesion on CXR  - He was saturating low 70s at home, requiring BiPAP in ED  - Rocephin and azithromycin started in ED  - Continue BiPAP, continue Rocephin and azithromycin, trend procalcitonin, check strep pneumo and legionella antigens   2. COPD  - No wheezing on admission  - Continue  budesonide and as-needed ipratropium-albuterol    3. Chronic diastolic CHF  - Appears compensated  - EF was 55-60% in November 2021  - Continue Lasix, losartan, and Coreg    4. Atrial fibrillation  - Not anticoagulated due to hx of ICH  - Continue ASA and Coreg  5. Type II DM  - A1c was 6.0% in March 2022  - Check CBGs and use low-intensity SSI if needed     DVT prophylaxis: Lovenox  Code Status: Full, discussed with patient  Level of Care: Level of care: Progressive Family Communication: none present  Disposition Plan:  Patient is from: Home  Anticipated d/c is to: TBD Anticipated d/c date is: 03/08/21 Patient currently: pending improvement in respiratory status  Consults called: none  Admission status: Inpatient     Briscoe Deutscher, MD [facility]  03/05/2021, 4:11 AM

## 2021-03-05 NOTE — Telephone Encounter (Signed)
Patient's son called and stated that patient is currently in the ER due to his breathing. States that patient does not want to take the oxygen but really needs it. Would like for someone to call

## 2021-03-05 NOTE — ED Notes (Signed)
Pt is not coughing unable to collect a speciman at  This time

## 2021-03-06 ENCOUNTER — Other Ambulatory Visit: Payer: Self-pay

## 2021-03-06 DIAGNOSIS — J9621 Acute and chronic respiratory failure with hypoxia: Secondary | ICD-10-CM | POA: Diagnosis not present

## 2021-03-06 LAB — CBC
HCT: 37 % — ABNORMAL LOW (ref 39.0–52.0)
Hemoglobin: 12.4 g/dL — ABNORMAL LOW (ref 13.0–17.0)
MCH: 31.1 pg (ref 26.0–34.0)
MCHC: 33.5 g/dL (ref 30.0–36.0)
MCV: 92.7 fL (ref 80.0–100.0)
Platelets: 164 10*3/uL (ref 150–400)
RBC: 3.99 MIL/uL — ABNORMAL LOW (ref 4.22–5.81)
RDW: 14.1 % (ref 11.5–15.5)
WBC: 8.2 10*3/uL (ref 4.0–10.5)
nRBC: 0 % (ref 0.0–0.2)

## 2021-03-06 LAB — LEGIONELLA PNEUMOPHILA SEROGP 1 UR AG: L. pneumophila Serogp 1 Ur Ag: NEGATIVE

## 2021-03-06 LAB — GLUCOSE, CAPILLARY
Glucose-Capillary: 128 mg/dL — ABNORMAL HIGH (ref 70–99)
Glucose-Capillary: 132 mg/dL — ABNORMAL HIGH (ref 70–99)
Glucose-Capillary: 174 mg/dL — ABNORMAL HIGH (ref 70–99)
Glucose-Capillary: 139 mg/dL — ABNORMAL HIGH (ref 70–99)
Glucose-Capillary: 177 mg/dL — ABNORMAL HIGH (ref 70–99)

## 2021-03-06 LAB — BASIC METABOLIC PANEL
Anion gap: 4 — ABNORMAL LOW (ref 5–15)
BUN: 16 mg/dL (ref 8–23)
CO2: 32 mmol/L (ref 22–32)
Calcium: 8.6 mg/dL — ABNORMAL LOW (ref 8.9–10.3)
Chloride: 101 mmol/L (ref 98–111)
Creatinine, Ser: 0.78 mg/dL (ref 0.61–1.24)
GFR, Estimated: >60 mL/min (ref >60)
Glucose, Bld: 133 mg/dL — ABNORMAL HIGH (ref 70–99)
Potassium: 4.4 mmol/L (ref 3.5–5.1)
Sodium: 137 mmol/L (ref 135–145)

## 2021-03-06 LAB — PROCALCITONIN: Procalcitonin: <0.1 ng/mL

## 2021-03-06 MED ORDER — DOXYCYCLINE HYCLATE 100 MG PO TABS
100.0000 mg | ORAL_TABLET | Freq: Two times a day (BID) | ORAL | Status: DC
  Administered 2021-03-06 – 2021-03-10 (×8): 100 mg via ORAL
  Filled 2021-03-06 (×8): qty 1

## 2021-03-06 MED ORDER — INSULIN ASPART 100 UNIT/ML IJ SOLN
0.0000 [IU] | Freq: Every day | INTRAMUSCULAR | Status: DC
Start: 2021-03-06 — End: 2021-03-12
  Administered 2021-03-09: 2 [IU] via SUBCUTANEOUS
  Administered 2021-03-10: 3 [IU] via SUBCUTANEOUS

## 2021-03-06 MED ORDER — INSULIN ASPART 100 UNIT/ML IJ SOLN
0.0000 [IU] | Freq: Three times a day (TID) | INTRAMUSCULAR | Status: DC
Start: 2021-03-06 — End: 2021-03-12
  Administered 2021-03-06 – 2021-03-08 (×4): 1 [IU] via SUBCUTANEOUS
  Administered 2021-03-08: 2 [IU] via SUBCUTANEOUS
  Administered 2021-03-09: 5 [IU] via SUBCUTANEOUS
  Administered 2021-03-09 – 2021-03-10 (×2): 2 [IU] via SUBCUTANEOUS
  Administered 2021-03-10: 3 [IU] via SUBCUTANEOUS
  Administered 2021-03-10: 2 [IU] via SUBCUTANEOUS

## 2021-03-06 NOTE — Plan of Care (Signed)

## 2021-03-06 NOTE — Progress Notes (Signed)
PROGRESS NOTE    Bradley [name]  AXK:[id number] DOB: Mar 23, 1941 DOA: 03/05/2021 PCP: [name], MD    Brief Narrative:  Bradley [name]. is a 80 year old male with past medical history significant for persistent atrial fibrillation s/p AVN ablation with CRT-P not on anticoagulation secondary to ICH 2013, chronic diastolic congestive heart failure, COPD, chronic respiratory failure on 3 L nasal cannula at baseline, PAD, CAD presented to [facility], ED on 5/26 with progressive shortness of breath.  Onset 2-3 days ago.  Increased cough with productive sputum.  Denies any lower extremity edema.  No chest pain.  Patient was found at home to be saturating 73% on his normal home oxygen by EMS.  Patient was given nitroglycerin and Solu-Medrol, placed on CPAP and transported to the ED.  In the ED, patient was afebrile, with SPO2 90s on BiPAP.  He was tachypneic with normal blood pressure.  EKG notable for ventricular pacing.  139, potassium 4.1, chloride 100, CO2 30, glucose 157, BUN 16, creatinine 0.94.  WBC 8.5, hemoglobin 13.5, platelets 202.  BNP 146.1.  High-sensitivity troponin 7>5.  BNP 146.1. Chest x-ray concerning for left basilar focal infiltrate.  COVID-19 pending. Patient was maintained on BiPAP in the ED and started on azithromycin and ceftriaxone for community-acquired pneumonia.  Hospital service consulted for further evaluation and management of acute on chronic hypoxic respite failure secondary to CAP.   Assessment & Plan:   Principal Problem:   Acute on chronic respiratory failure with hypoxia (HCC) Active Problems:   DM II (diabetes mellitus, type II), controlled (HCC)   Essential hypertension   Chronic diastolic (congestive) heart failure (HCC)   Pneumonia   Non-ischemic cardiomyopathy (HCC)   COPD GOLD 0 spirometry/ AB    Acute on chronic hypoxic respiratory failure, POA Community-acquired pneumonia Patient presenting to the ED after being found hypoxic on his home O2  with associated progressive shortness of breath and productive cough over the last 2-3 days.  Denies any sick contacts.  Chest x-ray concerning for left lower lobe infiltrate.  Patient is afebrile without leukocytosis.  Initially requiring BiPAP, titrated off this morning.  Strep pneumo urinary antigen negative.  CT chest without contrast with fairly extensive patchy bronchopneumonia both lower lobes, left worse than right. --Sputum culture: Pending --COVID-19: pending --Doxycycline 100 mg p.o. twice daily --Ceftriaxone 2 g IV every 24 hours x5 days --Spirometry, flutter valve --Supplemental oxygen, maintain SPO2 greater than 88%, down to 7L [place] today (3L baseline)  --Bipap prn and qHS  Nonsustained ventricular tachycardia Overnight, 5 beat run of ventricular tachycardia.  Magnesium level within normal limits.  Zithromax changed to doxycycline.  Patient is symptomatic. --Continue monitor on telemetry --Monitor electrolytes daily  Persistent atrial fibrillation Patient with previous AV nodal ablation with CRT-P.  Follows with cardiology outpatient, Dr. Duke Salvia.  Not on chronic anticoagulation due to prior intracranial bleed. --Carvedilol 25 mg p.o. twice daily --Aspirin 81 mg p.o. daily  Chronic diastolic congestive heart failure --Continue carvedilol and losartan --Continue home furosemide 80 mg T/T/S/S and 120 mg M/W/F --Strict I's and O's and daily weights  COPD Patient follows with pulmonology outpatient, [name].  At baseline on 3 L nasal cannula. --Budesonide nebs twice daily --DuoNebs every 4 hours as needed shortness of breath/wheezing  Type 2 diabetes mellitus Hemoglobin A1c 6.0 on 12/09/2020.  On Januvia 100 mg p.o. daily at home. --Hold oral hypoglycemics while inpatient --SSI for coverage --CBG before every meal/at bedtime  HLD: Atorvastatin 20 mg  p.o. daily  Essential hypertension BP 120/66 this morning, well controlled. --Amlodipine 10 mg p.o. daily --Carvedilol 25  mg p.o. twice daily --Losartan 50 mg p.o. daily --Continue aspirin and statin  BPH: Tamsulosin 0.4 mg p.o. daily  GERD: Continue PPI   DVT prophylaxis: enoxaparin (LOVENOX) injection 40 mg Start: 03/05/21 1600    Code Status: Full Code Family Communication: No family present at bedside, updated patients wife [name] via telephone this morning.  Disposition Plan:  Level of care: Progressive Status is: Inpatient  Remains inpatient appropriate because:Ongoing diagnostic testing needed not appropriate for outpatient work up, Unsafe d/c plan, IV treatments appropriate due to intensity of illness or inability to take PO and Inpatient level of care appropriate due to severity of illness   Dispo: The patient is from: Home              Anticipated d/c is to: Home              Patient currently is not medically stable to d/c.   Difficult to place patient No  Consultants:   None  Procedures:   BiPAP  Antimicrobials:   Azithromycin 5/26 - 5/27  Doxycycline 5/27>>  Ceftriaxone 5/26>>   Subjective: Patient seen examined bedside, lying in bed.  Patient reports dyspnea improved.  Oxygen now titrated down to 7 L nasal cannula with SPO2 96% this morning.  No family present at bedside.  Nursing reported 5 beat run of ventricular tachycardia overnight, azithromycin changed to doxycycline by night coverage physician.  No other questions or concerns at this time. Denies headache, no fever/chills/night sweats, no nausea/vomiting/diarrhea, no chest pain, no palpitations, no abdominal pain, no weakness, no fatigue, no paresthesias.  No acute events overnight per nursing staff.  Objective: Vitals:   03/06/21 0358 03/06/21 0441 03/06/21 0755 03/06/21 0840  BP: 122/66  (!) 150/82   Pulse: 69  72   Resp: (!) 21  20   Temp: 98.3 F (36.8 C)  98.6 F (37 C)   TempSrc: Axillary  Axillary   SpO2: 95%  96% 97%  Weight:  83 kg    Height:        Intake/Output Summary (Last 24 hours) at  03/06/2021 1048 Last data filed at 03/06/2021 0430 Gross per 24 hour  Intake 590 ml  Output 500 ml  Net 90 ml   Filed Weights   03/05/21 0041 03/06/21 0441  Weight: 88 kg 83 kg    Examination:  General exam: Appears calm and comfortable, chronically ill appearance Respiratory system: Decreased breath sounds bilateral bases, crackles bilateral bases, normal respiratory effort, on 7 L nasal cannula with SPO2 96% Cardiovascular system: S1 & S2 heard, RRR. No JVD, murmurs, rubs, gallops or clicks. No pedal edema. Gastrointestinal system: Abdomen is nondistended, soft and nontender. No organomegaly or masses felt. Normal bowel sounds heard. Central nervous system: Alert and oriented. No focal neurological deficits. Extremities: Symmetric 5 x 5 power. Skin: No rashes, lesions or ulcers Psychiatry: Judgement and insight appear normal. Mood & affect appropriate.     Data Reviewed: I have personally reviewed following labs and imaging studies  CBC: Recent Labs  Lab 03/05/21 0103 03/05/21 0529 03/06/21 0500  WBC 8.5 9.1 8.2  NEUTROABS 6.4  --   --   HGB 13.5 14.2 12.4*  HCT 41.7 42.8 37.0*  MCV 94.3 92.6 92.7  PLT 202 188 164   Basic Metabolic Panel: Recent Labs  Lab 03/05/21 0103 03/05/21 0529 03/05/21 2209 03/06/21 0500  NA  139 140 138 137  K 4.1 4.2 4.3 4.4  CL 100 103 102 101  CO2 30 31 31  32  GLUCOSE 157* 185* 182* 133*  BUN 16 15 18 16   CREATININE 0.94 0.83 0.90 0.78  CALCIUM 8.5* 8.6* 8.7* 8.6*  MG  --   --  2.0  --    GFR: Estimated Creatinine Clearance: 73.6 mL/min (by C-G formula based on SCr of 0.78 mg/dL). Liver Function Tests: Recent Labs  Lab 03/05/21 0103  AST 18  ALT 13  ALKPHOS 82  BILITOT 0.9  PROT 6.8  ALBUMIN 3.5   No results for input(s): LIPASE, AMYLASE in the last 168 hours. No results for input(s): AMMONIA in the last 168 hours. Coagulation Profile: No results for input(s): INR, PROTIME in the last 168 hours. Cardiac Enzymes: No  results for input(s): CKTOTAL, CKMB, CKMBINDEX, TROPONINI in the last 168 hours. BNP (last 3 results) No results for input(s): PROBNP in the last 8760 hours. HbA1C: Recent Labs    03/05/21 0529  HGBA1C 5.9*   CBG: Recent Labs  Lab 03/05/21 1609 03/05/21 2002 03/05/21 2348 03/06/21 0356 03/06/21 0757  GLUCAP 171* 194* 209* 128* 132*   Lipid Profile: No results for input(s): CHOL, HDL, LDLCALC, TRIG, CHOLHDL, LDLDIRECT in the last 72 hours. Thyroid Function Tests: No results for input(s): TSH, T4TOTAL, FREET4, T3FREE, THYROIDAB in the last 72 hours. Anemia Panel: No results for input(s): VITAMINB12, FOLATE, FERRITIN, TIBC, IRON, RETICCTPCT in the last 72 hours. Sepsis Labs: Recent Labs  Lab 03/05/21 0529 03/06/21 0500  PROCALCITON <0.10 <0.10    Recent Results (from the past 240 hour(s))  SARS CORONAVIRUS 2 (TAT 6-24 HRS) Nasopharyngeal Nasopharyngeal Swab     Status: None   Collection Time: 03/05/21  5:45 AM   Specimen: Nasopharyngeal Swab  Result Value Ref Range Status   SARS Coronavirus 2 NEGATIVE NEGATIVE Final    Comment: (NOTE) SARS-CoV-2 target nucleic acids are NOT DETECTED.  The SARS-CoV-2 RNA is generally detectable in upper and lower respiratory specimens during the acute phase of infection. Negative results do not preclude SARS-CoV-2 infection, do not rule out co-infections with other pathogens, and should not be used as the sole basis for treatment or other patient management decisions. Negative results must be combined with clinical observations, patient history, and epidemiological information. The expected result is Negative.  Fact Sheet for Patients: 03/08/21  Fact Sheet for Healthcare Providers: 03/07/21  This test is not yet approved or cleared by the [email or web address] FDA and  has been authorized for detection and/or diagnosis of SARS-CoV-2 by FDA under an Emergency Use  Authorization (EUA). This EUA will remain  in effect (meaning this test can be used) for the duration of the COVID-19 declaration under Se ction 564(b)(1) of the Act, 21 U.S.C. section 360bbb-3(b)(1), unless the authorization is terminated or revoked sooner.  Performed at [facility] Lab, [address], [place], [address] [place]   Expectorated Sputum Assessment w Gram Stain, Rflx to Resp Cult     Status: None   Collection Time: 03/05/21  5:35 PM   Specimen: Expectorated Sputum  Result Value Ref Range Status   Specimen Description EXPECTORATED SPUTUM  Final   Special Requests NONE  Final   Sputum evaluation   Final    THIS SPECIMEN IS ACCEPTABLE FOR SPUTUM CULTURE Performed at [facility] Lab, [address], [place], [address] [place]    Report Status 03/05/2021 FINAL  Final  Culture, Respiratory w Gram Stain  Status: None (Preliminary result)   Collection Time: 03/05/21  5:35 PM  Result Value Ref Range Status   Specimen Description EXPECTORATED SPUTUM  Final   Special Requests NONE Reflexed from [id number]  Final   Gram Stain   Final    ABUNDANT WBC PRESENT,BOTH PMN AND MONONUCLEAR RARE GRAM POSITIVE COCCI RARE GRAM VARIABLE ROD    Culture   Final    NO GROWTH < 12 HOURS Performed at [facility], [address] [place], [place] [address]    Report Status PENDING  Incomplete         Radiology Studies: CT CHEST WO CONTRAST  Result Date: 03/05/2021 CLINICAL DATA:  Abnormal density at the left lung base by radiography. EXAM: CT CHEST WITHOUT CONTRAST TECHNIQUE: Multidetector CT imaging of the chest was performed following the standard protocol without IV contrast. COMPARISON:  Chest radiography same day.  Previous CT 09/13/2019 FINDINGS: Cardiovascular: Cardiomegaly. Coronary artery calcification. Aortic atherosclerotic calcification. Dual lead pacemaker in place. No pericardial fluid. Mediastinum/Nodes: No mediastinal or hilar mass. Mild reactive prominence of  the lymph nodes. Lungs/Pleura: Patchy bronchopneumonia in both lower lobes, more extensive on the left than the right. Small amount pleural fluid on the right. Upper Abdomen: Relative elevation of the left hemidiaphragm. Upper abdomen appears negative. Musculoskeletal: Ordinary mild degenerative changes of the spine. IMPRESSION: Fairly extensive patchy bronchopneumonia in both lower lobes, left worse than right. Aortic Atherosclerosis (ICD10-I70.0). Electronically Signed   By: Paulina Fusi M.D.   On: 03/05/2021 11:22   DG Chest Portable 1 View  Result Date: 03/05/2021 CLINICAL DATA:  Dyspnea, COPD, CHF EXAM: PORTABLE CHEST 1 VIEW COMPARISON:  07/28/2020 FINDINGS: The lungs are symmetrically expanded. A focal peripheral infiltrate is seen within the a left lung base, possibly infectious or inflammatory in the acute setting. An underlying mass lesion could appear similarly. No pneumothorax or pleural effusion. Mild cardiomegaly is stable. Left subclavian pacemaker with 2 leads extending into the right ventricle are unchanged. Pulmonary vascularity is normal. No acute bone abnormality., IMPRESSION: Left basilar focal pulmonary infiltrate, possibly infectious or inflammatory. Follow-up chest radiograph is recommended in 3-4 weeks to document resolution. Electronically Signed   By: Helyn Numbers MD   On: 03/05/2021 01:18        Scheduled Meds: . amLODipine  10 mg Oral Daily  . aspirin  81 mg Oral q morning  . atorvastatin  20 mg Oral QHS  . budesonide  0.5 mg Nebulization BID  . carvedilol  25 mg Oral BID WC  . enoxaparin (LOVENOX) injection  40 mg Subcutaneous Q24H  . furosemide  40 mg Oral Q M,W,F-1800  . furosemide  80 mg Oral Daily  . insulin aspart  0-9 Units Subcutaneous Q4H  . losartan  50 mg Oral Daily  . pantoprazole  40 mg Oral Daily  . tamsulosin  0.4 mg Oral QPC supper   Continuous Infusions: . cefTRIAXone (ROCEPHIN)  IV 2 g (03/06/21 0053)  . doxycycline (VIBRAMYCIN) IV 100 mg  (03/05/21 2229)     LOS: 1 day    Time spent: 39 minutes spent on chart review, discussion with nursing staff, consultants, updating family and interview/physical exam; more than 50% of that time was spent in counseling and/or coordination of care.    Alvira Philips [place], DO [facility] Available via Epic secure chat 7am-7pm After these hours, please refer to coverage provider listed on [email or web address] 03/06/2021, 10:48 AM

## 2021-03-07 DIAGNOSIS — J9621 Acute and chronic respiratory failure with hypoxia: Secondary | ICD-10-CM | POA: Diagnosis not present

## 2021-03-07 LAB — BASIC METABOLIC PANEL
Anion gap: 6 (ref 5–15)
BUN: 21 mg/dL (ref 8–23)
CO2: 33 mmol/L — ABNORMAL HIGH (ref 22–32)
Calcium: 8.2 mg/dL — ABNORMAL LOW (ref 8.9–10.3)
Chloride: 101 mmol/L (ref 98–111)
Creatinine, Ser: 0.86 mg/dL (ref 0.61–1.24)
GFR, Estimated: >60 mL/min (ref >60)
Glucose, Bld: 141 mg/dL — ABNORMAL HIGH (ref 70–99)
Potassium: 3.9 mmol/L (ref 3.5–5.1)
Sodium: 140 mmol/L (ref 135–145)

## 2021-03-07 LAB — GLUCOSE, CAPILLARY
Glucose-Capillary: 115 mg/dL — ABNORMAL HIGH (ref 70–99)
Glucose-Capillary: 131 mg/dL — ABNORMAL HIGH (ref 70–99)
Glucose-Capillary: 98 mg/dL (ref 70–99)
Glucose-Capillary: 176 mg/dL — ABNORMAL HIGH (ref 70–99)

## 2021-03-07 LAB — MAGNESIUM: Magnesium: 1.9 mg/dL (ref 1.7–2.4)

## 2021-03-07 MED ORDER — TRAZODONE HCL 50 MG PO TABS
50.0000 mg | ORAL_TABLET | Freq: Every day | ORAL | Status: DC
  Administered 2021-03-07 – 2021-03-10 (×4): 50 mg via ORAL
  Filled 2021-03-07 (×4): qty 1

## 2021-03-07 MED ORDER — ALUM & MAG HYDROXIDE-SIMETH 200-200-20 MG/5ML PO SUSP
15.0000 mL | Freq: Once | ORAL | Status: AC
  Administered 2021-03-07: 15 mL via ORAL
  Filled 2021-03-07: qty 30

## 2021-03-07 MED ORDER — MAGNESIUM OXIDE -MG SUPPLEMENT 400 (240 MG) MG PO TABS
400.0000 mg | ORAL_TABLET | Freq: Once | ORAL | Status: AC
  Administered 2021-03-07: 400 mg via ORAL
  Filled 2021-03-07: qty 1

## 2021-03-07 MED ORDER — PREDNISONE 20 MG PO TABS
40.0000 mg | ORAL_TABLET | Freq: Every day | ORAL | Status: DC
  Administered 2021-03-08 – 2021-03-10 (×3): 40 mg via ORAL
  Filled 2021-03-07 (×3): qty 2

## 2021-03-07 NOTE — Evaluation (Signed)
Physical Therapy Evaluation Patient Details Name: Bradley LATOUR Sr. MRN: [id number] DOB: July 07, 1941 Today's Date: 03/07/2021   History of Present Illness  80 y/o male presented to ED for 2-3 days of worsening SOB. Admitted for pneumonia and acute on chronic hypoxic respiratory failure. PMH: COPD on 3L O2 baseline, CHF, afib, type II DM, PPM, AV node ablation in 2012  Clinical Impression  PTA, patient lives with wife and reports independence with mobility. Patient currently functioning at supervision level with no AD. Patient on 6L O2 [place] upon arrival with spO2 88%. Increased to 95% with standing and pursed lip breathing. Patient able to maintain spO2 >90% with ambulation. Patient with mild balance deficits noted with turns and navigating around obstacles. Encouraged patient to ambulate with nursing staff. Patient presents with generalized weakness, impaired balance, and decreased activity tolerance. Patient will benefit from skilled PT services during acute stay to address listed deficits. No PT follow up recommended at this time as patient declines follow up PT.     Follow Up Recommendations No PT follow up (patient declines follow up PT)    Equipment Recommendations  None recommended by PT    Recommendations for Other Services       Precautions / Restrictions Precautions Precautions: Fall Precaution Comments: watch O2 Restrictions Weight Bearing Restrictions: No      Mobility  Bed Mobility Overal bed mobility: Modified Independent                  Transfers Overall transfer level: Needs assistance Equipment used: None Transfers: Sit to/from Stand Sit to Stand: Supervision         General transfer comment: supervision for safety  Ambulation/Gait Ambulation/Gait assistance: Supervision Gait Distance (Feet): 60 Feet Assistive device: None Gait Pattern/deviations: Step-through pattern;Drifts right/left Gait velocity: decreased   General Gait Details: minor LOBs  noted but able to self correct. Drifiting L/R  Stairs            Wheelchair Mobility    Modified Rankin (Stroke Patients Only)       Balance Overall balance assessment: Mild deficits observed, not formally tested                                           Pertinent Vitals/Pain Pain Assessment: No/denies pain    Home Living Family/patient expects to be discharged to:: Private residence Living Arrangements: Spouse/significant other Available Help at Discharge: Family Type of Home: House Home Access: Stairs to enter   Secretary/administrator of Steps: 1 Home Layout: Multi-level Home Equipment: Cane - single point      Prior Function Level of Independence: Independent         Comments: on 3L O2 at baseline     Hand Dominance        Extremity/Trunk Assessment   Upper Extremity Assessment Upper Extremity Assessment: Overall WFL for tasks assessed    Lower Extremity Assessment Lower Extremity Assessment: Overall WFL for tasks assessed    Cervical / Trunk Assessment Cervical / Trunk Assessment: Normal  Communication   Communication: No difficulties  Cognition Arousal/Alertness: Awake/alert Behavior During Therapy: WFL for tasks assessed/performed Overall Cognitive Status: Within Functional Limits for tasks assessed  General Comments General comments (skin integrity, edema, etc.): On 6L O2 [place] on arrival. Initially spO2 88% but increased to 95% upon standing and pursed lip breathing. Patient ambulated and was able to maintain >90%    Exercises     Assessment/Plan    PT Assessment Patient needs continued PT services  PT Problem List Decreased strength;Decreased activity tolerance;Decreased balance;Decreased mobility;Cardiopulmonary status limiting activity       PT Treatment Interventions DME instruction;Gait training;Stair training;Functional mobility training;Therapeutic  activities;Therapeutic exercise;Balance training;Patient/family education    PT Goals (Current goals can be found in the Care Plan section)  Acute Rehab PT Goals Patient Stated Goal: to go home PT Goal Formulation: With patient Time For Goal Achievement: 03/21/21 Potential to Achieve Goals: Good    Frequency Min 3X/week   Barriers to discharge        Co-evaluation               AM-PAC PT "6 Clicks" Mobility  Outcome Measure Help needed turning from your back to your side while in a flat bed without using bedrails?: None Help needed moving from lying on your back to sitting on the side of a flat bed without using bedrails?: None Help needed moving to and from a bed to a chair (including a wheelchair)?: A Little Help needed standing up from a chair using your arms (e.g., wheelchair or bedside chair)?: A Little Help needed to walk in hospital room?: A Little Help needed climbing 3-5 steps with a railing? : A Little 6 Click Score: 20    End of Session Equipment Utilized During Treatment: Gait belt Activity Tolerance: Patient tolerated treatment well Patient left: in bed;with call bell/phone within reach Nurse Communication: Mobility status PT Visit Diagnosis: Unsteadiness on feet (R26.81);Muscle weakness (generalized) (M62.81)    Time: 8841-6606 PT Time Calculation (min) (ACUTE ONLY): 21 min   Charges:   PT Evaluation $PT Eval Moderate Complexity: 1 Mod          Fenix Rorke A. Dan Humphreys PT, DPT Acute Rehabilitation Services Pager [phone] Office [phone]   Viviann Spare 03/07/2021, 5:11 PM

## 2021-03-07 NOTE — Progress Notes (Signed)
PROGRESS NOTE    Bradley [name]  KDX:[id number] DOB: 02/23/1941 DOA: 03/05/2021 PCP: [name], MD    Brief Narrative:  Bradley [name]. is a 80 year old male with past medical history significant for persistent atrial fibrillation s/p AVN ablation with CRT-P not on anticoagulation secondary to ICH 2013, chronic diastolic congestive heart failure, COPD, chronic respiratory failure on 3 L nasal cannula at baseline, PAD, CAD presented to [facility], ED on 5/26 with progressive shortness of breath.  Onset 2-3 days ago.  Increased cough with productive sputum.  Denies any lower extremity edema.  No chest pain.  Patient was found at home to be saturating 73% on his normal home oxygen by EMS.  Patient was given nitroglycerin and Solu-Medrol, placed on CPAP and transported to the ED.  In the ED, patient was afebrile, with SPO2 90s on BiPAP.  He was tachypneic with normal blood pressure.  EKG notable for ventricular pacing.  139, potassium 4.1, chloride 100, CO2 30, glucose 157, BUN 16, creatinine 0.94.  WBC 8.5, hemoglobin 13.5, platelets 202.  BNP 146.1.  High-sensitivity troponin 7>5.  BNP 146.1. Chest x-ray concerning for left basilar focal infiltrate.  COVID-19 pending. Patient was maintained on BiPAP in the ED and started on azithromycin and ceftriaxone for community-acquired pneumonia.  Hospital service consulted for further evaluation and management of acute on chronic hypoxic respite failure secondary to CAP.   Assessment & Plan:   Principal Problem:   Acute on chronic respiratory failure with hypoxia (HCC) Active Problems:   DM II (diabetes mellitus, type II), controlled (HCC)   Essential hypertension   Chronic diastolic (congestive) heart failure (HCC)   Pneumonia   Non-ischemic cardiomyopathy (HCC)   COPD GOLD 0 spirometry/ AB    Acute on chronic hypoxic respiratory failure, POA Community-acquired pneumonia Patient presenting to the ED after being found hypoxic on his home O2  with associated progressive shortness of breath and productive cough over the last 2-3 days.  Denies any sick contacts.  Chest x-ray concerning for left lower lobe infiltrate.  Patient is afebrile without leukocytosis.  Initially requiring BiPAP, titrated off this morning.  Strep pneumo urinary antigen negative.  CT chest without contrast with fairly extensive patchy bronchopneumonia both lower lobes, left worse than right. --Sputum culture: Pending --COVID-19: pending --Doxycycline 100 mg p.o. twice daily x 7 days --Ceftriaxone 2 g IV every 24 hours x 7 days --Spirometry, flutter valve --Supplemental oxygen, maintain SPO2 greater than 88%, down to 6L [place] today (3L baseline)  --Bipap prn and qHS  Nonsustained ventricular tachycardia 5 beat run of ventricular tachycardia noted 5/27.  Magnesium level within normal limits.  Zithromax changed to doxycycline.  Patient is symptomatic. --Continue monitor on telemetry --Monitor electrolytes daily  Persistent atrial fibrillation Patient with previous AV nodal ablation with CRT-P.  Follows with cardiology outpatient, Dr. Duke Salvia.  Not on chronic anticoagulation due to prior intracranial bleed. --Carvedilol 25 mg p.o. twice daily --Aspirin 81 mg p.o. daily  Chronic diastolic congestive heart failure --Continue carvedilol and losartan --Continue home furosemide 80 mg T/T/S/S and 120 mg M/W/F --Strict I's and O's and daily weights  COPD Exacerbation Patient follows with pulmonology outpatient, [name].  At baseline on 3 L nasal cannula. --Budesonide nebs twice daily --prednisone 40mg  PO daily --doxycycline as above --DuoNebs every 4 hours as needed shortness of breath/wheezing  Type 2 diabetes mellitus Hemoglobin A1c 6.0 on 12/09/2020.  On Januvia 100 mg p.o. daily at home. --Hold oral hypoglycemics while inpatient --  SSI for coverage --CBG before every meal/at bedtime  HLD: Atorvastatin 20 mg p.o. daily  Essential hypertension BP 130/69  this morning, well controlled. --Amlodipine 10 mg p.o. daily --Carvedilol 25 mg p.o. twice daily --Losartan 50 mg p.o. daily --Continue aspirin and statin  BPH: Tamsulosin 0.4 mg p.o. daily  GERD: Continue PPI   DVT prophylaxis: enoxaparin (LOVENOX) injection 40 mg Start: 03/05/21 1600    Code Status: Full Code Family Communication: No family present at bedside, updated patients wife Bradley [name] via telephone this morning.  Disposition Plan:  Level of care: Progressive Status is: Inpatient  Remains inpatient appropriate because:Ongoing diagnostic testing needed not appropriate for outpatient work up, Unsafe d/c plan, IV treatments appropriate due to intensity of illness or inability to take PO and Inpatient level of care appropriate due to severity of illness   Dispo: The patient is from: Home              Anticipated d/c is to: Home              Patient currently is not medically stable to d/c.   Difficult to place patient No  Consultants:   None  Procedures:   BiPAP  Antimicrobials:   Azithromycin 5/26 - 5/27  Doxycycline 5/27>>  Ceftriaxone 5/26>>   Subjective: Patient seen examined bedside, lying in bed.  Patient reports dyspnea improved, although some wheezing this morning.  Oxygen now titrated down to 6 L nasal cannula with SPO2 96% this morning.  No other questions or concerns at this time. Denies headache, no fever/chills/night sweats, no nausea/vomiting/diarrhea, no chest pain, no palpitations, no abdominal pain, no weakness, no fatigue, no paresthesias.  No acute events overnight per nursing staff.  Objective: Vitals:   03/07/21 0021 03/07/21 0422 03/07/21 0727 03/07/21 1213  BP: 110/60 130/69 116/62 104/66  Pulse: 70 74 71 74  Resp: 19 20 (!) 21 20  Temp: 98.9 F (37.2 C) 98.7 F (37.1 C) 99 F (37.2 C) 98.4 F (36.9 C)  TempSrc: Axillary Axillary Oral Axillary  SpO2: 93% 91% 91% 93%  Weight:      Height:        Intake/Output Summary (Last 24  hours) at 03/07/2021 1406 Last data filed at 03/07/2021 1216 Gross per 24 hour  Intake 1070 ml  Output 1950 ml  Net -880 ml   Filed Weights   03/05/21 0041 03/06/21 0441  Weight: 88 kg 83 kg    Examination:  General exam: Appears calm and comfortable, chronically ill appearance Respiratory system: Decreased breath sounds bilateral bases, crackles bilateral bases, normal respiratory effort, on 6 L nasal cannula with SPO2 91% Cardiovascular system: S1 & S2 heard, RRR. No JVD, murmurs, rubs, gallops or clicks. No pedal edema. Gastrointestinal system: Abdomen is nondistended, soft and nontender. No organomegaly or masses felt. Normal bowel sounds heard. Central nervous system: Alert and oriented. No focal neurological deficits. Extremities: Symmetric 5 x 5 power. Skin: No rashes, lesions or ulcers Psychiatry: Judgement and insight appear normal. Mood & affect appropriate.     Data Reviewed: I have personally reviewed following labs and imaging studies  CBC: Recent Labs  Lab 03/05/21 0103 03/05/21 0529 03/06/21 0500  WBC 8.5 9.1 8.2  NEUTROABS 6.4  --   --   HGB 13.5 14.2 12.4*  HCT 41.7 42.8 37.0*  MCV 94.3 92.6 92.7  PLT 202 188 164   Basic Metabolic Panel: Recent Labs  Lab 03/05/21 0103 03/05/21 0529 03/05/21 2209 03/06/21 0500 03/07/21 0025  NA  139 140 138 137 140  K 4.1 4.2 4.3 4.4 3.9  CL 100 103 102 101 101  CO2 30 31 31  32 33*  GLUCOSE 157* 185* 182* 133* 141*  BUN 16 15 18 16 21   CREATININE 0.94 0.83 0.90 0.78 0.86  CALCIUM 8.5* 8.6* 8.7* 8.6* 8.2*  MG  --   --  2.0  --  1.9   GFR: Estimated Creatinine Clearance: 68.5 mL/min (by C-G formula based on SCr of 0.86 mg/dL). Liver Function Tests: Recent Labs  Lab 03/05/21 0103  AST 18  ALT 13  ALKPHOS 82  BILITOT 0.9  PROT 6.8  ALBUMIN 3.5   No results for input(s): LIPASE, AMYLASE in the last 168 hours. No results for input(s): AMMONIA in the last 168 hours. Coagulation Profile: No results for  input(s): INR, PROTIME in the last 168 hours. Cardiac Enzymes: No results for input(s): CKTOTAL, CKMB, CKMBINDEX, TROPONINI in the last 168 hours. BNP (last 3 results) No results for input(s): PROBNP in the last 8760 hours. HbA1C: Recent Labs    03/05/21 0529  HGBA1C 5.9*   CBG: Recent Labs  Lab 03/06/21 1129 03/06/21 1642 03/06/21 2109 03/07/21 0724 03/07/21 1212  GLUCAP 174* 139* 177* 115* 131*   Lipid Profile: No results for input(s): CHOL, HDL, LDLCALC, TRIG, CHOLHDL, LDLDIRECT in the last 72 hours. Thyroid Function Tests: No results for input(s): TSH, T4TOTAL, FREET4, T3FREE, THYROIDAB in the last 72 hours. Anemia Panel: No results for input(s): VITAMINB12, FOLATE, FERRITIN, TIBC, IRON, RETICCTPCT in the last 72 hours. Sepsis Labs: Recent Labs  Lab 03/05/21 0529 03/06/21 0500  PROCALCITON <0.10 <0.10    Recent Results (from the past 240 hour(s))  SARS CORONAVIRUS 2 (TAT 6-24 HRS) Nasopharyngeal Nasopharyngeal Swab     Status: None   Collection Time: 03/05/21  5:45 AM   Specimen: Nasopharyngeal Swab  Result Value Ref Range Status   SARS Coronavirus 2 NEGATIVE NEGATIVE Final    Comment: (NOTE) SARS-CoV-2 target nucleic acids are NOT DETECTED.  The SARS-CoV-2 RNA is generally detectable in upper and lower respiratory specimens during the acute phase of infection. Negative results do not preclude SARS-CoV-2 infection, do not rule out co-infections with other pathogens, and should not be used as the sole basis for treatment or other patient management decisions. Negative results must be combined with clinical observations, patient history, and epidemiological information. The expected result is Negative.  Fact Sheet for Patients: 03/08/21  Fact Sheet for Healthcare Providers: 03/07/21  This test is not yet approved or cleared by the [email or web address] FDA and  has been authorized for detection  and/or diagnosis of SARS-CoV-2 by FDA under an Emergency Use Authorization (EUA). This EUA will remain  in effect (meaning this test can be used) for the duration of the COVID-19 declaration under Se ction 564(b)(1) of the Act, 21 U.S.C. section 360bbb-3(b)(1), unless the authorization is terminated or revoked sooner.  Performed at [facility], [address], [place], [address] [place]   Expectorated Sputum Assessment w Gram Stain, Rflx to Resp Cult     Status: None   Collection Time: 03/05/21  5:35 PM   Specimen: Expectorated Sputum  Result Value Ref Range Status   Specimen Description EXPECTORATED SPUTUM  Final   Special Requests NONE  Final   Sputum evaluation   Final    THIS SPECIMEN IS ACCEPTABLE FOR SPUTUM CULTURE Performed at [facility], [address], [place], [address] [place]    Report Status 03/05/2021  FINAL  Final  Culture, Respiratory w Gram Stain     Status: None (Preliminary result)   Collection Time: 03/05/21  5:35 PM  Result Value Ref Range Status   Specimen Description EXPECTORATED SPUTUM  Final   Special Requests NONE Reflexed from [id number]  Final   Gram Stain   Final    ABUNDANT WBC PRESENT,BOTH PMN AND MONONUCLEAR RARE GRAM POSITIVE COCCI RARE GRAM VARIABLE ROD    Culture   Final    NO GROWTH < 12 HOURS Performed at [facility], [address] [place], [place] [address]    Report Status PENDING  Incomplete         Radiology Studies: No results found.      Scheduled Meds: . amLODipine  10 mg Oral Daily  . aspirin  81 mg Oral q morning  . atorvastatin  20 mg Oral QHS  . budesonide  0.5 mg Nebulization BID  . carvedilol  25 mg Oral BID WC  . doxycycline  100 mg Oral Q12H  . enoxaparin (LOVENOX) injection  40 mg Subcutaneous Q24H  . furosemide  40 mg Oral Q M,W,F-1800  . furosemide  80 mg Oral Daily  . insulin aspart  0-5 Units Subcutaneous QHS  . insulin aspart  0-9 Units Subcutaneous TID WC  . losartan  50 mg Oral  Daily  . pantoprazole  40 mg Oral Daily  . [START ON 03/08/2021] predniSONE  40 mg Oral Q breakfast  . tamsulosin  0.4 mg Oral QPC supper  . traZODone  50 mg Oral QHS   Continuous Infusions: . cefTRIAXone (ROCEPHIN)  IV Stopped (03/06/21 2330)     LOS: 2 days    Time spent: 39 minutes spent on chart review, discussion with nursing staff, consultants, updating family and interview/physical exam; more than 50% of that time was spent in counseling and/or coordination of care.    Alvira Philips [place], DO [facility] Available via Epic secure chat 7am-7pm After these hours, please refer to coverage provider listed on [email or web address] 03/07/2021, 2:06 PM

## 2021-03-08 DIAGNOSIS — J9621 Acute and chronic respiratory failure with hypoxia: Secondary | ICD-10-CM | POA: Diagnosis not present

## 2021-03-08 LAB — GLUCOSE, CAPILLARY
Glucose-Capillary: 146 mg/dL — ABNORMAL HIGH (ref 70–99)
Glucose-Capillary: 131 mg/dL — ABNORMAL HIGH (ref 70–99)
Glucose-Capillary: 198 mg/dL — ABNORMAL HIGH (ref 70–99)
Glucose-Capillary: 229 mg/dL — ABNORMAL HIGH (ref 70–99)

## 2021-03-08 LAB — CULTURE, RESPIRATORY W GRAM STAIN: Culture: NORMAL

## 2021-03-08 LAB — MRSA PCR SCREENING: MRSA by PCR: NEGATIVE

## 2021-03-08 MED ORDER — ALUM & MAG HYDROXIDE-SIMETH 200-200-20 MG/5ML PO SUSP
15.0000 mL | Freq: Once | ORAL | Status: AC
  Administered 2021-03-08: 15 mL via ORAL
  Filled 2021-03-08: qty 30

## 2021-03-08 NOTE — Progress Notes (Signed)
PROGRESS NOTE    Bradley [name]  BLT:[id number] DOB: 11-Dec-1940 DOA: 03/05/2021 PCP: [name], MD    Brief Narrative:  Bradley [name]. is a 80 year old male with past medical history significant for persistent atrial fibrillation s/p AVN ablation with CRT-P not on anticoagulation secondary to ICH 2013, chronic diastolic congestive heart failure, COPD, chronic respiratory failure on 3 L nasal cannula at baseline, PAD, CAD presented to [facility], ED on 5/26 with progressive shortness of breath.  Onset 2-3 days ago.  Increased cough with productive sputum.  Denies any lower extremity edema.  No chest pain.  Patient was found at home to be saturating 73% on his normal home oxygen by EMS.  Patient was given nitroglycerin and Solu-Medrol, placed on CPAP and transported to the ED.  In the ED, patient was afebrile, with SPO2 90s on BiPAP.  He was tachypneic with normal blood pressure.  EKG notable for ventricular pacing.  139, potassium 4.1, chloride 100, CO2 30, glucose 157, BUN 16, creatinine 0.94.  WBC 8.5, hemoglobin 13.5, platelets 202.  BNP 146.1.  High-sensitivity troponin 7>5.  BNP 146.1. Chest x-ray concerning for left basilar focal infiltrate.  COVID-19 pending. Patient was maintained on BiPAP in the ED and started on azithromycin and ceftriaxone for community-acquired pneumonia.  Hospital service consulted for further evaluation and management of acute on chronic hypoxic respite failure secondary to CAP.   Assessment & Plan:   Principal Problem:   Acute on chronic respiratory failure with hypoxia (HCC) Active Problems:   DM II (diabetes mellitus, type II), controlled (HCC)   Essential hypertension   Chronic diastolic (congestive) heart failure (HCC)   Pneumonia   Non-ischemic cardiomyopathy (HCC)   COPD GOLD 0 spirometry/ AB    Acute on chronic hypoxic respiratory failure, POA Community-acquired pneumonia Patient presenting to the ED after being found hypoxic on his home O2  with associated progressive shortness of breath and productive cough over the last 2-3 days.  Denies any sick contacts.  Chest x-ray concerning for left lower lobe infiltrate.  Patient is afebrile without leukocytosis.  Initially requiring BiPAP, titrated off this morning.  Strep pneumo urinary antigen negative.  CT chest without contrast with fairly extensive patchy bronchopneumonia both lower lobes, left worse than right.-19 PCR negative.  Sputum culture with normal flora. --Doxycycline 100 mg p.o. twice daily x 7 days --Ceftriaxone 2 g IV every 24 hours x 7 days --Spirometry, flutter valve --Supplemental oxygen, maintain SPO2 greater than 88%, down to 6L [place] today (3L baseline)  --Bipap prn and qHS  Nonsustained ventricular tachycardia 5 beat run of ventricular tachycardia noted 5/27.  Magnesium level within normal limits.  Zithromax changed to doxycycline.  Patient is symptomatic. --Continue monitor on telemetry --Monitor electrolytes daily  Persistent atrial fibrillation Patient with previous AV nodal ablation with CRT-P.  Follows with cardiology outpatient, Dr. Duke Salvia.  Not on chronic anticoagulation due to prior intracranial bleed. --Carvedilol 25 mg p.o. twice daily --Aspirin 81 mg p.o. daily  Chronic diastolic congestive heart failure --Continue carvedilol and losartan --Continue home furosemide 80 mg T/T/S/S and 120 mg M/W/F --Strict I's and O's and daily weights  COPD Exacerbation Patient follows with pulmonology outpatient, [name].  At baseline on 3 L nasal cannula. --Budesonide nebs twice daily --prednisone 40mg  PO daily x 5 days --doxycycline as above --DuoNebs every 4 hours as needed shortness of breath/wheezing  Type 2 diabetes mellitus Hemoglobin A1c 6.0 on 12/09/2020.  On Januvia 100 mg p.o. daily at  home. --Hold oral hypoglycemics while inpatient --SSI for coverage --CBG before every meal/at bedtime  HLD: Atorvastatin 20 mg p.o. daily  Essential  hypertension BP 124/64 this morning, well controlled. --Amlodipine 10 mg p.o. daily --Carvedilol 25 mg p.o. twice daily --Losartan 50 mg p.o. daily --Continue aspirin and statin  BPH: Tamsulosin 0.4 mg p.o. daily  GERD: Continue PPI   DVT prophylaxis: enoxaparin (LOVENOX) injection 40 mg Start: 03/05/21 1600    Code Status: Full Code Family Communication: No family present at bedside this morning  Disposition Plan:  Level of care: Progressive Status is: Inpatient  Remains inpatient appropriate because:Ongoing diagnostic testing needed not appropriate for outpatient work up, Unsafe d/c plan, IV treatments appropriate due to intensity of illness or inability to take PO and Inpatient level of care appropriate due to severity of illness   Dispo: The patient is from: Home              Anticipated d/c is to: Home              Patient currently is not medically stable to d/c.   Difficult to place patient No  Consultants:   None  Procedures:   BiPAP  Antimicrobials:   Azithromycin 5/26 - 5/27  Doxycycline 5/27>>  Ceftriaxone 5/26>>   Subjective: Patient seen examined bedside, lying in bed.  Patient reports dyspnea improved, although some wheezing this morning.  Oxygen now titrated down to 6 L nasal cannula with SPO2 97% this morning.  Patient reports poor sleep overnight.  No other questions or concerns at this time. Denies headache, no fever/chills/night sweats, no nausea/vomiting/diarrhea, no chest pain, no palpitations, no abdominal pain, no weakness, no fatigue, no paresthesias.  No acute events overnight per nursing staff.  Objective: Vitals:   03/08/21 0500 03/08/21 0715 03/08/21 0837 03/08/21 1154  BP: 124/64 125/66  (!) 108/92  Pulse: 77 72  69  Resp: (!) 22 (!) 22  19  Temp: 98.1 F (36.7 C) 98.6 F (37 C)  98.2 F (36.8 C)  TempSrc: Oral Oral  Oral  SpO2: 97% 90% 91% 94%  Weight: 82.8 kg     Height:        Intake/Output Summary (Last 24 hours) at  03/08/2021 1324 Last data filed at 03/08/2021 1110 Gross per 24 hour  Intake 360 ml  Output 200 ml  Net 160 ml   Filed Weights   03/05/21 0041 03/06/21 0441 03/08/21 0500  Weight: 88 kg 83 kg 82.8 kg    Examination:  General exam: Appears calm and comfortable, chronically ill appearance Respiratory system: Decreased breath sounds bilateral bases, crackles bilateral bases, normal respiratory effort, on 6 L nasal cannula with SPO2 97% Cardiovascular system: S1 & S2 heard, RRR. No JVD, murmurs, rubs, gallops or clicks. No pedal edema. Gastrointestinal system: Abdomen is nondistended, soft and nontender. No organomegaly or masses felt. Normal bowel sounds heard. Central nervous system: Alert and oriented. No focal neurological deficits. Extremities: Symmetric 5 x 5 power. Skin: No rashes, lesions or ulcers Psychiatry: Judgement and insight appear normal. Mood & affect appropriate.     Data Reviewed: I have personally reviewed following labs and imaging studies  CBC: Recent Labs  Lab 03/05/21 0103 03/05/21 0529 03/06/21 0500  WBC 8.5 9.1 8.2  NEUTROABS 6.4  --   --   HGB 13.5 14.2 12.4*  HCT 41.7 42.8 37.0*  MCV 94.3 92.6 92.7  PLT 202 188 164   Basic Metabolic Panel: Recent Labs  Lab 03/05/21 0103  03/05/21 0529 03/05/21 2209 03/06/21 0500 03/07/21 0025  NA 139 140 138 137 140  K 4.1 4.2 4.3 4.4 3.9  CL 100 103 102 101 101  CO2 30 31 31  32 33*  GLUCOSE 157* 185* 182* 133* 141*  BUN 16 15 18 16 21   CREATININE 0.94 0.83 0.90 0.78 0.86  CALCIUM 8.5* 8.6* 8.7* 8.6* 8.2*  MG  --   --  2.0  --  1.9   GFR: Estimated Creatinine Clearance: 68.5 mL/min (by C-G formula based on SCr of 0.86 mg/dL). Liver Function Tests: Recent Labs  Lab 03/05/21 0103  AST 18  ALT 13  ALKPHOS 82  BILITOT 0.9  PROT 6.8  ALBUMIN 3.5   No results for input(s): LIPASE, AMYLASE in the last 168 hours. No results for input(s): AMMONIA in the last 168 hours. Coagulation Profile: No  results for input(s): INR, PROTIME in the last 168 hours. Cardiac Enzymes: No results for input(s): CKTOTAL, CKMB, CKMBINDEX, TROPONINI in the last 168 hours. BNP (last 3 results) No results for input(s): PROBNP in the last 8760 hours. HbA1C: No results for input(s): HGBA1C in the last 72 hours. CBG: Recent Labs  Lab 03/07/21 1212 03/07/21 1634 03/07/21 1944 03/08/21 0714 03/08/21 1152  GLUCAP 131* 98 176* 146* 131*   Lipid Profile: No results for input(s): CHOL, HDL, LDLCALC, TRIG, CHOLHDL, LDLDIRECT in the last 72 hours. Thyroid Function Tests: No results for input(s): TSH, T4TOTAL, FREET4, T3FREE, THYROIDAB in the last 72 hours. Anemia Panel: No results for input(s): VITAMINB12, FOLATE, FERRITIN, TIBC, IRON, RETICCTPCT in the last 72 hours. Sepsis Labs: Recent Labs  Lab 03/05/21 0529 03/06/21 0500  PROCALCITON <0.10 <0.10    Recent Results (from the past 240 hour(s))  SARS CORONAVIRUS 2 (TAT 6-24 HRS) Nasopharyngeal Nasopharyngeal Swab     Status: None   Collection Time: 03/05/21  5:45 AM   Specimen: Nasopharyngeal Swab  Result Value Ref Range Status   SARS Coronavirus 2 NEGATIVE NEGATIVE Final    Comment: (NOTE) SARS-CoV-2 target nucleic acids are NOT DETECTED.  The SARS-CoV-2 RNA is generally detectable in upper and lower respiratory specimens during the acute phase of infection. Negative results do not preclude SARS-CoV-2 infection, do not rule out co-infections with other pathogens, and should not be used as the sole basis for treatment or other patient management decisions. Negative results must be combined with clinical observations, patient history, and epidemiological information. The expected result is Negative.  Fact Sheet for Patients: 03/08/21  Fact Sheet for Healthcare Providers: 03/07/21  This test is not yet approved or cleared by the [email or web address] FDA and  has been authorized  for detection and/or diagnosis of SARS-CoV-2 by FDA under an Emergency Use Authorization (EUA). This EUA will remain  in effect (meaning this test can be used) for the duration of the COVID-19 declaration under Se ction 564(b)(1) of the Act, 21 U.S.C. section 360bbb-3(b)(1), unless the authorization is terminated or revoked sooner.  Performed at [facility], [address], [place], [address] [place]   Expectorated Sputum Assessment w Gram Stain, Rflx to Resp Cult     Status: None   Collection Time: 03/05/21  5:35 PM   Specimen: Expectorated Sputum  Result Value Ref Range Status   Specimen Description EXPECTORATED SPUTUM  Final   Special Requests NONE  Final   Sputum evaluation   Final    THIS SPECIMEN IS ACCEPTABLE FOR SPUTUM CULTURE Performed at [facility], [address] [place],  [place] [address]    Report Status 03/05/2021 FINAL  Final  Culture, Respiratory w Gram Stain     Status: None   Collection Time: 03/05/21  5:35 PM  Result Value Ref Range Status   Specimen Description EXPECTORATED SPUTUM  Final   Special Requests NONE Reflexed from [id number]  Final   Gram Stain   Final    ABUNDANT WBC PRESENT,BOTH PMN AND MONONUCLEAR RARE GRAM POSITIVE COCCI RARE GRAM VARIABLE ROD    Culture   Final    RARE Normal respiratory flora-no Staph aureus or Pseudomonas seen Performed at [facility], [address], [place], [place] [address]    Report Status 03/08/2021 FINAL  Final  MRSA PCR Screening     Status: None   Collection Time: 03/07/21  9:55 PM   Specimen: Nasal Mucosa; Nasopharyngeal  Result Value Ref Range Status   MRSA by PCR NEGATIVE NEGATIVE Final    Comment:        The GeneXpert MRSA Assay (FDA approved for NASAL specimens only), is one component of a comprehensive MRSA colonization surveillance program. It is not intended to diagnose MRSA infection nor to guide or monitor treatment for MRSA infections. Performed at [facility],  [address], [place], [place] [address]          Radiology Studies: No results found.      Scheduled Meds: . amLODipine  10 mg Oral Daily  . aspirin  81 mg Oral q morning  . atorvastatin  20 mg Oral QHS  . budesonide  0.5 mg Nebulization BID  . carvedilol  25 mg Oral BID WC  . doxycycline  100 mg Oral Q12H  . enoxaparin (LOVENOX) injection  40 mg Subcutaneous Q24H  . furosemide  40 mg Oral Q M,W,F-1800  . furosemide  80 mg Oral Daily  . insulin aspart  0-5 Units Subcutaneous QHS  . insulin aspart  0-9 Units Subcutaneous TID WC  . losartan  50 mg Oral Daily  . pantoprazole  40 mg Oral Daily  . predniSONE  40 mg Oral Q breakfast  . tamsulosin  0.4 mg Oral QPC supper  . traZODone  50 mg Oral QHS   Continuous Infusions: . cefTRIAXone (ROCEPHIN)  IV 2 g (03/07/21 2337)     LOS: 3 days    Time spent: 38 minutes spent on chart review, discussion with nursing staff, consultants, updating family and interview/physical exam; more than 50% of that time was spent in counseling and/or coordination of care.    Alvira Philips [place], DO [facility] Available via Epic secure chat 7am-7pm After these hours, please refer to coverage provider listed on [email or web address] 03/08/2021, 1:24 PM

## 2021-03-09 ENCOUNTER — Inpatient Hospital Stay (HOSPITAL_COMMUNITY): Payer: Medicare Other

## 2021-03-09 DIAGNOSIS — J9621 Acute and chronic respiratory failure with hypoxia: Secondary | ICD-10-CM | POA: Diagnosis not present

## 2021-03-09 LAB — BASIC METABOLIC PANEL
Anion gap: 5 (ref 5–15)
BUN: 15 mg/dL (ref 8–23)
CO2: 35 mmol/L — ABNORMAL HIGH (ref 22–32)
Calcium: 8.6 mg/dL — ABNORMAL LOW (ref 8.9–10.3)
Chloride: 98 mmol/L (ref 98–111)
Creatinine, Ser: 0.8 mg/dL (ref 0.61–1.24)
GFR, Estimated: >60 mL/min (ref >60)
Glucose, Bld: 195 mg/dL — ABNORMAL HIGH (ref 70–99)
Potassium: 4.1 mmol/L (ref 3.5–5.1)
Sodium: 138 mmol/L (ref 135–145)

## 2021-03-09 LAB — CBC
HCT: 33.7 % — ABNORMAL LOW (ref 39.0–52.0)
Hemoglobin: 11.2 g/dL — ABNORMAL LOW (ref 13.0–17.0)
MCH: 30.6 pg (ref 26.0–34.0)
MCHC: 33.2 g/dL (ref 30.0–36.0)
MCV: 92.1 fL (ref 80.0–100.0)
Platelets: 188 10*3/uL (ref 150–400)
RBC: 3.66 MIL/uL — ABNORMAL LOW (ref 4.22–5.81)
RDW: 13.2 % (ref 11.5–15.5)
WBC: 6.5 10*3/uL (ref 4.0–10.5)
nRBC: 0 % (ref 0.0–0.2)

## 2021-03-09 LAB — GLUCOSE, CAPILLARY
Glucose-Capillary: 97 mg/dL (ref 70–99)
Glucose-Capillary: 156 mg/dL — ABNORMAL HIGH (ref 70–99)
Glucose-Capillary: 279 mg/dL — ABNORMAL HIGH (ref 70–99)
Glucose-Capillary: 216 mg/dL — ABNORMAL HIGH (ref 70–99)

## 2021-03-09 LAB — MAGNESIUM: Magnesium: 2 mg/dL (ref 1.7–2.4)

## 2021-03-09 LAB — BRAIN NATRIURETIC PEPTIDE: B Natriuretic Peptide: 414.5 pg/mL — ABNORMAL HIGH (ref 0.0–100.0)

## 2021-03-09 MED ORDER — FUROSEMIDE 10 MG/ML IJ SOLN
40.0000 mg | Freq: Three times a day (TID) | INTRAMUSCULAR | Status: DC
  Administered 2021-03-09 – 2021-03-10 (×4): 40 mg via INTRAVENOUS
  Filled 2021-03-09 (×5): qty 4

## 2021-03-09 NOTE — Progress Notes (Signed)
Physical Therapy Treatment Patient Details Name: Bradley SPEROS Sr. MRN: [id number] DOB: Aug 22, 1941 Today's Date: 03/09/2021    History of Present Illness 80 y/o male presented to ED for 2-3 days of worsening SOB. Admitted for pneumonia and acute on chronic hypoxic respiratory failure on 03/05/21. PMH: COPD on 3L O2 baseline, CHF, afib, type II DM, PPM, AV node ablation in 2012    Bradley [name] Comments    Bradley [name] making good progress.  He was able to tolerate 3 L O2 with sats >88% for 150' but did drop to 85% after 220'.  Recovered on 4 L.  (Bradley [name] was on 6 L at arrival but left of 4 L with stable sats and RN notified). Bradley [name] with improved balance and able to manage O2 tank with supervision.  Continue plan of care.     Follow Up Recommendations  No Bradley [name] follow up     Equipment Recommendations  None recommended by Bradley [name]    Recommendations for Other Services       Precautions / Restrictions Precautions Precautions: Fall Precaution Comments: watch O2    Mobility  Bed Mobility Overal bed mobility: Independent                  Transfers Overall transfer level: Needs assistance Equipment used: None Transfers: Sit to/from Stand Sit to Stand: Supervision         General transfer comment: supervision for safety; performed x 3  Ambulation/Gait Ambulation/Gait assistance: Supervision Gait Distance (Feet): 220 Feet Assistive device: None Gait Pattern/deviations: Step-through pattern Gait velocity: decreased   General Gait Details: Bradley [name] able to manage O2 tank; DOE of 2/4 at end of walk; see comments for sats   Stairs             Wheelchair Mobility    Modified Rankin (Stroke Patients Only)       Balance Overall balance assessment: Needs assistance   Sitting balance-Leahy Scale: Normal       Standing balance-Leahy Scale: Good                              Cognition Arousal/Alertness: Awake/alert Behavior During Therapy: WFL for tasks assessed/performed Overall  Cognitive Status: Within Functional Limits for tasks assessed                                        Exercises      General Comments General comments (skin integrity, edema, etc.): Bradley [name] on 6 L HFNC at arrival with sats 90%.  Did note high flow cannula connected to regular O2 extension.  Removed O2 extension.  Started ambulation on 3 L O2 with sats 93% but did drop to 88% after 150 feet but down to 85% by 220'.  Required 4 L to recover.  Able to leave on 4 L O2 with sats 94%.  Notified RN .      Pertinent Vitals/Pain Pain Assessment: No/denies pain    Home Living                      Prior Function            Bradley [name] Goals (current goals can now be found in the care plan section) Acute Rehab Bradley [name] Goals Patient Stated Goal: to go home Bradley [name] Goal Formulation: With patient Time For Goal Achievement: 03/21/21 Potential to Achieve  Goals: Good Progress towards Bradley [name] goals: Progressing toward goals    Frequency    Min 3X/week      Bradley [name] Plan Current plan remains appropriate    Co-evaluation              AM-PAC Bradley [name] "6 Clicks" Mobility   Outcome Measure  Help needed turning from your back to your side while in a flat bed without using bedrails?: None Help needed moving from lying on your back to sitting on the side of a flat bed without using bedrails?: None Help needed moving to and from a bed to a chair (including a wheelchair)?: None Help needed standing up from a chair using your arms (e.g., wheelchair or bedside chair)?: None Help needed to walk in hospital room?: A Little Help needed climbing 3-5 steps with a railing? : A Little 6 Click Score: 22    End of Session Equipment Utilized During Treatment: Gait belt Activity Tolerance: Patient tolerated treatment well Patient left: in bed;with call bell/phone within reach Nurse Communication: Mobility status Bradley [name] Visit Diagnosis: Unsteadiness on feet (R26.81);Muscle weakness (generalized) (M62.81)     Time:  1030-1045 Bradley [name] Time Calculation (min) (ACUTE ONLY): 15 min  Charges:  $Gait Training: 8-22 mins                     Bradley [name], Bradley [name] Acute Rehab Services Pager [phone] [facility] [phone]     Bradley [name] 03/09/2021, 10:54 AM

## 2021-03-09 NOTE — Progress Notes (Signed)
PROGRESS NOTE    Bradley [name]  NWG:[id number] DOB: 05/25/1941 DOA: 03/05/2021 PCP: [name], MD    Brief Narrative:  Bradley [name]. is a 80 year old male with past medical history significant for persistent atrial fibrillation s/p AVN ablation with CRT-P not on anticoagulation secondary to ICH 2013, chronic diastolic congestive heart failure, COPD, chronic respiratory failure on 3 L nasal cannula at baseline, PAD, CAD presented to [facility], ED on 5/26 with progressive shortness of breath.  Onset 2-3 days ago.  Increased cough with productive sputum.  Denies any lower extremity edema.  No chest pain.  Patient was found at home to be saturating 73% on his normal home oxygen by EMS.  Patient was given nitroglycerin and Solu-Medrol, placed on CPAP and transported to the ED.  In the ED, patient was afebrile, with SPO2 90s on BiPAP.  He was tachypneic with normal blood pressure.  EKG notable for ventricular pacing.  139, potassium 4.1, chloride 100, CO2 30, glucose 157, BUN 16, creatinine 0.94.  WBC 8.5, hemoglobin 13.5, platelets 202.  BNP 146.1.  High-sensitivity troponin 7>5.  BNP 146.1. Chest x-ray concerning for left basilar focal infiltrate.  COVID-19 pending. Patient was maintained on BiPAP in the ED and started on azithromycin and ceftriaxone for community-acquired pneumonia.  Hospital service consulted for further evaluation and management of acute on chronic hypoxic respite failure secondary to CAP.   Assessment & Plan:   Principal Problem:   Acute on chronic respiratory failure with hypoxia (HCC) Active Problems:   DM II (diabetes mellitus, type II), controlled (HCC)   Essential hypertension   Chronic diastolic (congestive) heart failure (HCC)   Pneumonia   Non-ischemic cardiomyopathy (HCC)   COPD GOLD 0 spirometry/ AB    Acute on chronic hypoxic respiratory failure, POA Community-acquired pneumonia Patient presenting to the ED after being found hypoxic on his home O2  with associated progressive shortness of breath and productive cough over the last 2-3 days.  Denies any sick contacts.  Chest x-ray concerning for left lower lobe infiltrate.  Patient is afebrile without leukocytosis.  Initially requiring BiPAP, titrated off this morning.  Strep pneumo urinary antigen negative.  CT chest without contrast with fairly extensive patchy bronchopneumonia both lower lobes, left worse than right.-19 PCR negative.  Sputum culture with normal flora. --Doxycycline 100 mg p.o. twice daily x 7 days --Ceftriaxone 2 g IV every 24 hours x 7 days --Spirometry, flutter valve --Supplemental oxygen, maintain SPO2 greater than 88%, down to 6L [place] today (3L baseline)  --Bipap prn and qHS  Nonsustained ventricular tachycardia 5 beat run of ventricular tachycardia noted 5/27.  Magnesium level within normal limits.  Zithromax changed to doxycycline.  Patient is symptomatic. --Continue monitor on telemetry --Monitor electrolytes daily  Persistent atrial fibrillation Patient with previous AV nodal ablation with CRT-P.  Follows with cardiology outpatient, Dr. Duke Salvia.  Not on chronic anticoagulation due to prior intracranial bleed. --Carvedilol 25 mg p.o. twice daily --Aspirin 81 mg p.o. daily  Acute on chronic diastolic congestive heart failure Chest x-ray with vascular congestion, elevated BNP. Home regimen includes furosemide 80 mg T/T/S/S and 120 mg M/W/F --Home oral Lasix --Start furosemide 40 mg IV every 8 hours --Continue carvedilol and losartan --Continue  --Strict I's and O's and daily weights  COPD Exacerbation Patient follows with pulmonology outpatient, [name].  At baseline on 3 L nasal cannula. --Budesonide nebs twice daily --prednisone 40mg  PO daily x 5 days --doxycycline as above --DuoNebs every 4 hours  as needed shortness of breath/wheezing  Type 2 diabetes mellitus Hemoglobin A1c 6.0 on 12/09/2020.  On Januvia 100 mg p.o. daily at home. --Hold oral  hypoglycemics while inpatient --SSI for coverage --CBG before every meal/at bedtime  HLD: Atorvastatin 20 mg p.o. daily  Essential hypertension BP 120/63 this morning, well controlled. --Amlodipine 10 mg p.o. daily --Carvedilol 25 mg p.o. twice daily --Losartan 50 mg p.o. daily --Continue aspirin and statin  BPH: Tamsulosin 0.4 mg p.o. daily  GERD: Continue PPI   DVT prophylaxis: enoxaparin (LOVENOX) injection 40 mg Start: 03/05/21 1600    Code Status: Full Code Family Communication: No family present at bedside this morning, updated spouse via telephone this morning  Disposition Plan:  Level of care: Telemetry Medical Status is: Inpatient  Remains inpatient appropriate because:Ongoing diagnostic testing needed not appropriate for outpatient work up, Unsafe d/c plan, IV treatments appropriate due to intensity of illness or inability to take PO and Inpatient level of care appropriate due to severity of illness   Dispo: The patient is from: Home              Anticipated d/c is to: Home              Patient currently is not medically stable to d/c.   Difficult to place patient No  Consultants:   None  Procedures:   BiPAP  Antimicrobials:   Azithromycin 5/26 - 5/27  Doxycycline 5/27>>  Ceftriaxone 5/26>>   Subjective: Patient seen examined bedside, lying in bed.  Patient continues with some mild dyspnea, improved.  Continues on 6 L nasal cannula.  Elevated BNP and chest x-ray with pulmonary edema, starting IV Lasix today.  Updated patient's spouse via telephone in room this morning.  No other questions or concerns at this time. Denies headache, no fever/chills/night sweats, no nausea/vomiting/diarrhea, no chest pain, no palpitations, no abdominal pain, no weakness, no fatigue, no paresthesias.  No acute events overnight per nursing staff.  Objective: Vitals:   03/09/21 0351 03/09/21 0500 03/09/21 0733 03/09/21 0801  BP: 110/64   120/63  Pulse: 70   69  Resp: 20    20  Temp: (!) 97.5 F (36.4 C)   97.9 F (36.6 C)  TempSrc: Oral   Oral  SpO2: 99%  95% 92%  Weight:  84.8 kg    Height:        Intake/Output Summary (Last 24 hours) at 03/09/2021 1055 Last data filed at 03/09/2021 1025 Gross per 24 hour  Intake 840 ml  Output 1450 ml  Net -610 ml   Filed Weights   03/06/21 0441 03/08/21 0500 03/09/21 0500  Weight: 83 kg 82.8 kg 84.8 kg    Examination:  General exam: Appears calm and comfortable, chronically ill appearance Respiratory system: Decreased breath sounds bilateral bases, crackles bilateral bases, normal respiratory effort, on 6 L nasal cannula with SPO2 97% Cardiovascular system: S1 & S2 heard, RRR. No JVD, murmurs, rubs, gallops or clicks. No pedal edema. Gastrointestinal system: Abdomen is nondistended, soft and nontender. No organomegaly or masses felt. Normal bowel sounds heard. Central nervous system: Alert and oriented. No focal neurological deficits. Extremities: Symmetric 5 x 5 power. Skin: No rashes, lesions or ulcers Psychiatry: Judgement and insight appear normal. Mood & affect appropriate.     Data Reviewed: I have personally reviewed following labs and imaging studies  CBC: Recent Labs  Lab 03/05/21 0103 03/05/21 0529 03/06/21 0500 03/09/21 0040  WBC 8.5 9.1 8.2 6.5  NEUTROABS 6.4  --   --   --  HGB 13.5 14.2 12.4* 11.2*  HCT 41.7 42.8 37.0* 33.7*  MCV 94.3 92.6 92.7 92.1  PLT 202 188 164 188   Basic Metabolic Panel: Recent Labs  Lab 03/05/21 0529 03/05/21 2209 03/06/21 0500 03/07/21 0025 03/09/21 0040  NA 140 138 137 140 138  K 4.2 4.3 4.4 3.9 4.1  CL 103 102 101 101 98  CO2 31 31 32 33* 35*  GLUCOSE 185* 182* 133* 141* 195*  BUN 15 18 16 21 15   CREATININE 0.83 0.90 0.78 0.86 0.80  CALCIUM 8.6* 8.7* 8.6* 8.2* 8.6*  MG  --  2.0  --  1.9 2.0   GFR: Estimated Creatinine Clearance: 73.6 mL/min (by C-G formula based on SCr of 0.8 mg/dL). Liver Function Tests: Recent Labs  Lab  03/05/21 0103  AST 18  ALT 13  ALKPHOS 82  BILITOT 0.9  PROT 6.8  ALBUMIN 3.5   No results for input(s): LIPASE, AMYLASE in the last 168 hours. No results for input(s): AMMONIA in the last 168 hours. Coagulation Profile: No results for input(s): INR, PROTIME in the last 168 hours. Cardiac Enzymes: No results for input(s): CKTOTAL, CKMB, CKMBINDEX, TROPONINI in the last 168 hours. BNP (last 3 results) No results for input(s): PROBNP in the last 8760 hours. HbA1C: No results for input(s): HGBA1C in the last 72 hours. CBG: Recent Labs  Lab 03/08/21 0714 03/08/21 1152 03/08/21 1629 03/08/21 2052 03/09/21 0801  GLUCAP 146* 131* 198* 229* 97   Lipid Profile: No results for input(s): CHOL, HDL, LDLCALC, TRIG, CHOLHDL, LDLDIRECT in the last 72 hours. Thyroid Function Tests: No results for input(s): TSH, T4TOTAL, FREET4, T3FREE, THYROIDAB in the last 72 hours. Anemia Panel: No results for input(s): VITAMINB12, FOLATE, FERRITIN, TIBC, IRON, RETICCTPCT in the last 72 hours. Sepsis Labs: Recent Labs  Lab 03/05/21 0529 03/06/21 0500  PROCALCITON <0.10 <0.10    Recent Results (from the past 240 hour(s))  SARS CORONAVIRUS 2 (TAT 6-24 HRS) Nasopharyngeal Nasopharyngeal Swab     Status: None   Collection Time: 03/05/21  5:45 AM   Specimen: Nasopharyngeal Swab  Result Value Ref Range Status   SARS Coronavirus 2 NEGATIVE NEGATIVE Final    Comment: (NOTE) SARS-CoV-2 target nucleic acids are NOT DETECTED.  The SARS-CoV-2 RNA is generally detectable in upper and lower respiratory specimens during the acute phase of infection. Negative results do not preclude SARS-CoV-2 infection, do not rule out co-infections with other pathogens, and should not be used as the sole basis for treatment or other patient management decisions. Negative results must be combined with clinical observations, patient history, and epidemiological information. The expected result is Negative.  Fact Sheet  for Patients: HairSlick.no  Fact Sheet for Healthcare Providers: quierodirigir.com  This test is not yet approved or cleared by the [place] FDA and  has been authorized for detection and/or diagnosis of SARS-CoV-2 by FDA under an Emergency Use Authorization (EUA). This EUA will remain  in effect (meaning this test can be used) for the duration of the COVID-19 declaration under Se ction 564(b)(1) of the Act, 21 U.S.C. section 360bbb-3(b)(1), unless the authorization is terminated or revoked sooner.  Performed at [facility], [address], [place], [place] [address]   Expectorated Sputum Assessment w Gram Stain, Rflx to Resp Cult     Status: None   Collection Time: 03/05/21  5:35 PM   Specimen: Expectorated Sputum  Result Value Ref Range Status   Specimen Description EXPECTORATED SPUTUM  Final   Special  Requests NONE  Final   Sputum evaluation   Final    THIS SPECIMEN IS ACCEPTABLE FOR SPUTUM CULTURE Performed at [facility], [address], [place], [place] [address]    Report Status 03/05/2021 FINAL  Final  Culture, Respiratory w Gram Stain     Status: None   Collection Time: 03/05/21  5:35 PM  Result Value Ref Range Status   Specimen Description EXPECTORATED SPUTUM  Final   Special Requests NONE Reflexed from [id number]  Final   Gram Stain   Final    ABUNDANT WBC PRESENT,BOTH PMN AND MONONUCLEAR RARE GRAM POSITIVE COCCI RARE GRAM VARIABLE ROD    Culture   Final    RARE Normal respiratory flora-no Staph aureus or Pseudomonas seen Performed at [facility], [address] [place], [place] [address]    Report Status 03/08/2021 FINAL  Final  MRSA PCR Screening     Status: None   Collection Time: 03/07/21  9:55 PM   Specimen: Nasal Mucosa; Nasopharyngeal  Result Value Ref Range Status   MRSA by PCR NEGATIVE NEGATIVE Final    Comment:        The GeneXpert MRSA Assay (FDA approved for NASAL  specimens only), is one component of a comprehensive MRSA colonization surveillance program. It is not intended to diagnose MRSA infection nor to guide or monitor treatment for MRSA infections. Performed at [facility], [address], [place], [place] [address]          Radiology Studies: DG CHEST PORT 1 VIEW  Result Date: 03/09/2021 CLINICAL DATA:  Shortness of breath EXAM: PORTABLE CHEST 1 VIEW COMPARISON:  03/05/2021 FINDINGS: Multifocal patchy opacities in the lingula and bilateral lower lobes, left lower lobe predominant, suspicious for multifocal pneumonia. This is mildly progressive from the prior. No pleural effusion or pneumothorax. Cardiomegaly. Left subclavian pacemaker. Thoracic aortic atherosclerosis. IMPRESSION: Multifocal pneumonia, left lower lobe predominant, mildly progressive. Electronically Signed   By: Charline Bills M.D.   On: 03/09/2021 08:17        Scheduled Meds: . amLODipine  10 mg Oral Daily  . aspirin  81 mg Oral q morning  . atorvastatin  20 mg Oral QHS  . budesonide  0.5 mg Nebulization BID  . carvedilol  25 mg Oral BID WC  . doxycycline  100 mg Oral Q12H  . enoxaparin (LOVENOX) injection  40 mg Subcutaneous Q24H  . furosemide  40 mg Intravenous Q8H  . insulin aspart  0-5 Units Subcutaneous QHS  . insulin aspart  0-9 Units Subcutaneous TID WC  . losartan  50 mg Oral Daily  . pantoprazole  40 mg Oral Daily  . predniSONE  40 mg Oral Q breakfast  . tamsulosin  0.4 mg Oral QPC supper  . traZODone  50 mg Oral QHS   Continuous Infusions: . cefTRIAXone (ROCEPHIN)  IV 2 g (03/09/21 0003)     LOS: 4 days    Time spent: 38 minutes spent on chart review, discussion with nursing staff, consultants, updating family and interview/physical exam; more than 50% of that time was spent in counseling and/or coordination of care.    Alvira Philips [place], DO [facility] Available via Epic secure chat 7am-7pm After these hours, please refer  to coverage provider listed on [email or web address] 03/09/2021, 10:55 AM

## 2021-03-10 DIAGNOSIS — J189 Pneumonia, unspecified organism: Secondary | ICD-10-CM | POA: Diagnosis not present

## 2021-03-10 DIAGNOSIS — J449 Chronic obstructive pulmonary disease, unspecified: Secondary | ICD-10-CM | POA: Diagnosis not present

## 2021-03-10 DIAGNOSIS — J9621 Acute and chronic respiratory failure with hypoxia: Secondary | ICD-10-CM | POA: Diagnosis not present

## 2021-03-10 DIAGNOSIS — I10 Essential (primary) hypertension: Secondary | ICD-10-CM | POA: Diagnosis not present

## 2021-03-10 LAB — CBC
HCT: 35.3 % — ABNORMAL LOW (ref 39.0–52.0)
Hemoglobin: 11.9 g/dL — ABNORMAL LOW (ref 13.0–17.0)
MCH: 30.6 pg (ref 26.0–34.0)
MCHC: 33.7 g/dL (ref 30.0–36.0)
MCV: 90.7 fL (ref 80.0–100.0)
Platelets: 241 10*3/uL (ref 150–400)
RBC: 3.89 MIL/uL — ABNORMAL LOW (ref 4.22–5.81)
RDW: 13.2 % (ref 11.5–15.5)
WBC: 7.9 10*3/uL (ref 4.0–10.5)
nRBC: 0 % (ref 0.0–0.2)

## 2021-03-10 LAB — GLUCOSE, CAPILLARY
Glucose-Capillary: 159 mg/dL — ABNORMAL HIGH (ref 70–99)
Glucose-Capillary: 157 mg/dL — ABNORMAL HIGH (ref 70–99)
Glucose-Capillary: 233 mg/dL — ABNORMAL HIGH (ref 70–99)
Glucose-Capillary: 232 mg/dL — ABNORMAL HIGH (ref 70–99)
Glucose-Capillary: 294 mg/dL — ABNORMAL HIGH (ref 70–99)

## 2021-03-10 LAB — BRAIN NATRIURETIC PEPTIDE: B Natriuretic Peptide: 314.2 pg/mL — ABNORMAL HIGH (ref 0.0–100.0)

## 2021-03-10 LAB — BASIC METABOLIC PANEL
Anion gap: 8 (ref 5–15)
BUN: 17 mg/dL (ref 8–23)
CO2: 39 mmol/L — ABNORMAL HIGH (ref 22–32)
Calcium: 8.6 mg/dL — ABNORMAL LOW (ref 8.9–10.3)
Chloride: 90 mmol/L — ABNORMAL LOW (ref 98–111)
Creatinine, Ser: 0.77 mg/dL (ref 0.61–1.24)
GFR, Estimated: >60 mL/min (ref >60)
Glucose, Bld: 132 mg/dL — ABNORMAL HIGH (ref 70–99)
Potassium: 3.8 mmol/L (ref 3.5–5.1)
Sodium: 137 mmol/L (ref 135–145)

## 2021-03-10 LAB — MAGNESIUM: Magnesium: 2 mg/dL (ref 1.7–2.4)

## 2021-03-10 MED ORDER — PREDNISONE 5 MG PO TABS
30.0000 mg | ORAL_TABLET | Freq: Every day | ORAL | Status: DC
  Administered 2021-03-11: 30 mg via ORAL
  Filled 2021-03-10: qty 2

## 2021-03-10 MED ORDER — FUROSEMIDE 80 MG PO TABS
80.0000 mg | ORAL_TABLET | Freq: Every day | ORAL | Status: DC
  Administered 2021-03-10 – 2021-03-11 (×2): 80 mg via ORAL
  Filled 2021-03-10 (×2): qty 1

## 2021-03-10 NOTE — Progress Notes (Signed)
PROGRESS NOTE        PATIENT DETAILS Name: Bradley CASAGRANDE Sr. Age: 80 y.o. Sex: male Date of Birth: 01/23/1941 Admit Date: 03/05/2021 Admitting Physician Briscoe Deutscher, MD PCP:Paz, Nolon Rod, MD  Brief Narrative: Patient is a 80 y.o. 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  See below for further details.  Significant events: 5/26>> admit for worsening hypoxia due to pneumonia-requiring up to 10 L of HFNC.  Significant studies: 09/03/2020>> Echo: EF 55-60% 03/05/2021>> CT chest: Extensive patchy bronchopneumonia in bilateral lower lobes-left> right 03/09/2021>> CXR: Multifocal pneumonia  Antimicrobial therapy: Rocephin: 5/25>> Doxycycline: 5/26>>531  Microbiology data: 5/26>> sputum culture: No growth 5/26>> COVID 19 PCR: Negative  Procedures : None  Consults: None  DVT Prophylaxis : enoxaparin (LOVENOX) injection 40 mg Start: 03/05/21 1600  Subjective: Feels better-shortness of breath has markedly improved over the past few days.  Assessment/Plan: Acute on respiratory failure due to multifocal pneumonia: hypoxia has improved-clinically also better-down to usual 2-3 L of regimen.  Okay to stop doxycycline today-continue Rocephin for now.  HFpEF: Volume status has improved-switch to oral diuretic regimen.  Continue Coreg/losartan  COPD exacerbation: Improved-no wheezing-continue titrating down oral prednisone.  On bronchodilators.  Chronic atrial fibrillation: Rate controlled-history of AV node ablation.  Continue Coreg and aspirin.  Not a candidate for anticoagulation due to history of ICH.   History of pacemaker  implantation  HLD: Continue statin  HTN: BP stable-continue amlodipine, Coreg, losartan  DM-2 (A1c 6.0 on 3/1): CBGs stable on SSI.  Resume oral hypoglycemic agents on discharge  Recent Labs    03/09/21 2121 03/10/21 0731 03/10/21 1124  GLUCAP 216* 159* 157*   BPH: Continue Flomax  GERD: Continue PPI   Diet: Diet Order            Diet Heart Room service appropriate? Yes; Fluid consistency: Thin  Diet effective now                  Code Status: Full code   Family Communication:   Disposition Plan: Status is: Inpatient  Remains inpatient appropriate because:Inpatient level of care appropriate due to severity of illness   Dispo:  Patient From: Home  Planned Disposition: Home  Medically stable for discharge: No     Barriers to Discharge: Improving respiratory failure due to pneumonia-plan to monitor overnight before consideration of discharge on 6/1.  Antimicrobial agents: Anti-infectives (From admission, onward)   Start     Dose/Rate Route Frequency Ordered Stop   03/06/21 2200  doxycycline (VIBRA-TABS) tablet 100 mg        100 mg Oral Every 12 hours 03/06/21 1505 03/13/21 2159   03/06/21 0400  azithromycin (ZITHROMAX) 500 mg in sodium chloride 0.9 % 250 mL IVPB  Status:  Discontinued        500 mg 250 mL/hr over 60 Minutes Intravenous Every 24 hours 03/05/21 0411 03/05/21 2133   03/06/21 0000  cefTRIAXone (ROCEPHIN) 2 g in sodium chloride 0.9 % 100 mL IVPB        2 g 200 mL/hr over 30 Minutes Intravenous Every 24 hours 03/05/21 0411  03/12/21 2359   03/05/21 2230  doxycycline (VIBRAMYCIN) 100 mg in sodium chloride 0.9 % 250 mL IVPB  Status:  Discontinued        100 mg 125 mL/hr over 120 Minutes Intravenous Every 12 hours 03/05/21 2134 03/06/21 1505   03/05/21 0145  cefTRIAXone (ROCEPHIN) 2 g in sodium chloride 0.9 % 100 mL IVPB        2 g 200 mL/hr over 30 Minutes Intravenous  Once 03/05/21 0142 03/05/21 0321   03/05/21 0145  azithromycin (ZITHROMAX)  500 mg in sodium chloride 0.9 % 250 mL IVPB        500 mg 250 mL/hr over 60 Minutes Intravenous  Once 03/05/21 0142 03/05/21 0430       Time spent: 25- minutes-Greater than 50% of this time was spent in counseling, explanation of diagnosis, planning of further management, and coordination of care.  MEDICATIONS: Scheduled Meds: . amLODipine  10 mg Oral Daily  . aspirin  81 mg Oral q morning  . atorvastatin  20 mg Oral QHS  . budesonide  0.5 mg Nebulization BID  . carvedilol  25 mg Oral BID WC  . doxycycline  100 mg Oral Q12H  . enoxaparin (LOVENOX) injection  40 mg Subcutaneous Q24H  . furosemide  40 mg Intravenous Q8H  . insulin aspart  0-5 Units Subcutaneous QHS  . insulin aspart  0-9 Units Subcutaneous TID WC  . losartan  50 mg Oral Daily  . pantoprazole  40 mg Oral Daily  . predniSONE  40 mg Oral Q breakfast  . tamsulosin  0.4 mg Oral QPC supper  . traZODone  50 mg Oral QHS   Continuous Infusions: . cefTRIAXone (ROCEPHIN)  IV 2 g (03/10/21 0008)   PRN Meds:.acetaminophen **OR** acetaminophen, ipratropium-albuterol, ondansetron **OR** ondansetron (ZOFRAN) IV, senna-docusate   PHYSICAL EXAM: Vital signs: Vitals:   03/10/21 0431 03/10/21 0500 03/10/21 0852 03/10/21 1120  BP: 114/63   101/65  Pulse: 70   71  Resp: (!) 22   18  Temp: 97.9 F (36.6 C)   97.6 F (36.4 C)  TempSrc: Oral   Oral  SpO2: 94%  93% 93%  Weight:  82.4 kg    Height:       Filed Weights   03/08/21 0500 03/09/21 0500 03/10/21 0500  Weight: 82.8 kg 84.8 kg 82.4 kg   Body mass index is 26.83 kg/m.   Gen Exam:Alert awake-not in any distress HEENT:atraumatic, normocephalic Chest: B/L clear to auscultation anteriorly CVS:S1S2 regular Abdomen:soft non tender, non distended Extremities:no edema Neurology: Non focal Skin: no rash  I have personally reviewed following labs and imaging studies  LABORATORY DATA: CBC: Recent Labs  Lab 03/05/21 0103 03/05/21 0529 03/06/21 0500  03/09/21 0040 03/10/21 0048  WBC 8.5 9.1 8.2 6.5 7.9  NEUTROABS 6.4  --   --   --   --   HGB 13.5 14.2 12.4* 11.2* 11.9*  HCT 41.7 42.8 37.0* 33.7* 35.3*  MCV 94.3 92.6 92.7 92.1 90.7  PLT 202 188 164 188 241    Basic Metabolic Panel: Recent Labs  Lab 03/05/21 2209 03/06/21 0500 03/07/21 0025 03/09/21 0040 03/10/21 0048  NA 138 137 140 138 137  K 4.3 4.4 3.9 4.1 3.8  CL 102 101 101 98 90*  CO2 31 32 33* 35* 39*  GLUCOSE 182* 133* 141* 195* 132*  BUN 18 16 21 15 17   CREATININE 0.90 0.78 0.86 0.80 0.77  CALCIUM 8.7* 8.6* 8.2* 8.6* 8.6*  MG 2.0  --  1.9 2.0 2.0    GFR: Estimated Creatinine Clearance: 73.6 mL/min (by C-G formula based on SCr of 0.77 mg/dL).  Liver Function Tests: Recent Labs  Lab 03/05/21 0103  AST 18  ALT 13  ALKPHOS 82  BILITOT 0.9  PROT 6.8  ALBUMIN 3.5   No results for input(s): LIPASE, AMYLASE in the last 168 hours. No results for input(s): AMMONIA in the last 168 hours.  Coagulation Profile: No results for input(s): INR, PROTIME in the last 168 hours.  Cardiac Enzymes: No results for input(s): CKTOTAL, CKMB, CKMBINDEX, TROPONINI in the last 168 hours.  BNP (last 3 results) No results for input(s): PROBNP in the last 8760 hours.  Lipid Profile: No results for input(s): CHOL, HDL, LDLCALC, TRIG, CHOLHDL, LDLDIRECT in the last 72 hours.  Thyroid Function Tests: No results for input(s): TSH, T4TOTAL, FREET4, T3FREE, THYROIDAB in the last 72 hours.  Anemia Panel: No results for input(s): VITAMINB12, FOLATE, FERRITIN, TIBC, IRON, RETICCTPCT in the last 72 hours.  Urine analysis:    Component Value Date/Time   COLORURINE YELLOW 07/15/2015 1440   APPEARANCEUR CLEAR 07/15/2015 1440   LABSPEC 1.015 07/15/2015 1440   PHURINE 6.0 07/15/2015 1440   GLUCOSEU NEGATIVE 07/15/2015 1440   HGBUR TRACE-LYSED (A) 07/15/2015 1440   BILIRUBINUR NEGATIVE 07/15/2015 1440   KETONESUR TRACE (A) 07/15/2015 1440   PROTEINUR NEGATIVE 07/06/2012 0341    UROBILINOGEN 1.0 07/15/2015 1440   NITRITE NEGATIVE 07/15/2015 1440   LEUKOCYTESUR NEGATIVE 07/15/2015 1440    Sepsis Labs: Lactic Acid, Venous    Component Value Date/Time   LATICACIDVEN 0.8 05/07/2019 1041    MICROBIOLOGY: Recent Results (from the past 240 hour(s))  SARS CORONAVIRUS 2 (TAT 6-24 HRS) Nasopharyngeal Nasopharyngeal Swab     Status: None   Collection Time: 03/05/21  5:45 AM   Specimen: Nasopharyngeal Swab  Result Value Ref Range Status   SARS Coronavirus 2 NEGATIVE NEGATIVE Final    Comment: (NOTE) SARS-CoV-2 target nucleic acids are NOT DETECTED.  The SARS-CoV-2 RNA is generally detectable in upper and lower respiratory specimens during the acute phase of infection. Negative results do not preclude SARS-CoV-2 infection, do not rule out co-infections with other pathogens, and should not be used as the sole basis for treatment or other patient management decisions. Negative results must be combined with clinical observations, patient history, and epidemiological information. The expected result is Negative.  Fact Sheet for Patients: HairSlick.no  Fact Sheet for Healthcare Providers: quierodirigir.com  This test is not yet approved or cleared by the [place] FDA and  has been authorized for detection and/or diagnosis of SARS-CoV-2 by FDA under an Emergency Use Authorization (EUA). This EUA will remain  in effect (meaning this test can be used) for the duration of the COVID-19 declaration under Se ction 564(b)(1) of the Act, 21 U.S.C. section 360bbb-3(b)(1), unless the authorization is terminated or revoked sooner.  Performed at [facility], [address] [place], [place] [address]   Expectorated Sputum Assessment w Gram Stain, Rflx to Resp Cult     Status: None   Collection Time: 03/05/21  5:35 PM   Specimen: Expectorated Sputum  Result Value Ref Range Status   Specimen Description  EXPECTORATED SPUTUM  Final   Special Requests NONE  Final   Sputum evaluation   Final    THIS SPECIMEN IS ACCEPTABLE FOR SPUTUM CULTURE Performed at [facility], [address] [place], [place] [address]    Report Status 03/05/2021 FINAL  Final  Culture, Respiratory w Gram Stain     Status: None   Collection Time: 03/05/21  5:35 PM  Result Value Ref Range Status   Specimen Description EXPECTORATED SPUTUM  Final   Special Requests NONE Reflexed from [id number]  Final   Gram Stain   Final    ABUNDANT WBC PRESENT,BOTH PMN AND MONONUCLEAR RARE GRAM POSITIVE COCCI RARE GRAM VARIABLE ROD    Culture   Final    RARE Normal respiratory flora-no Staph aureus or Pseudomonas seen Performed at [facility], [address] [place], [place] [address]    Report Status 03/08/2021 FINAL  Final  MRSA PCR Screening     Status: None   Collection Time: 03/07/21  9:55 PM   Specimen: Nasal Mucosa; Nasopharyngeal  Result Value Ref Range Status   MRSA by PCR NEGATIVE NEGATIVE Final    Comment:        The GeneXpert MRSA Assay (FDA approved for NASAL specimens only), is one component of a comprehensive MRSA colonization surveillance program. It is not intended to diagnose MRSA infection nor to guide or monitor treatment for MRSA infections. Performed at [facility], [address], [place], [place] [address]     RADIOLOGY STUDIES/RESULTS: DG CHEST PORT 1 VIEW  Result Date: 03/09/2021 CLINICAL DATA:  Shortness of breath EXAM: PORTABLE CHEST 1 VIEW COMPARISON:  03/05/2021 FINDINGS: Multifocal patchy opacities in the lingula and bilateral lower lobes, left lower lobe predominant, suspicious for multifocal pneumonia. This is mildly progressive from the prior. No pleural effusion or pneumothorax. Cardiomegaly. Left subclavian pacemaker. Thoracic aortic atherosclerosis. IMPRESSION: Multifocal pneumonia, left lower lobe predominant, mildly progressive. Electronically Signed   By: Charline Bills M.D.   On: 03/09/2021 08:17     LOS: 5 days   Jeoffrey Massed, MD  [facility]    To contact the attending provider between 7A-7P or the covering provider during after hours 7P-7A, please log into the web site [email or web address] and access using universal Andrew password for that web site. If you do not have the password, please call the hospital operator.  03/10/2021, 1:54 PM

## 2021-03-10 NOTE — Plan of Care (Signed)

## 2021-03-11 ENCOUNTER — Other Ambulatory Visit (HOSPITAL_COMMUNITY): Payer: Self-pay

## 2021-03-11 DIAGNOSIS — I10 Essential (primary) hypertension: Secondary | ICD-10-CM | POA: Diagnosis not present

## 2021-03-11 DIAGNOSIS — J9621 Acute and chronic respiratory failure with hypoxia: Secondary | ICD-10-CM | POA: Diagnosis not present

## 2021-03-11 DIAGNOSIS — J449 Chronic obstructive pulmonary disease, unspecified: Secondary | ICD-10-CM | POA: Diagnosis not present

## 2021-03-11 DIAGNOSIS — I5032 Chronic diastolic (congestive) heart failure: Secondary | ICD-10-CM | POA: Diagnosis not present

## 2021-03-11 LAB — GLUCOSE, CAPILLARY: Glucose-Capillary: 107 mg/dL — ABNORMAL HIGH (ref 70–99)

## 2021-03-11 MED ORDER — ALBUTEROL SULFATE HFA 108 (90 BASE) MCG/ACT IN AERS
2.0000 | INHALATION_SPRAY | Freq: Four times a day (QID) | RESPIRATORY_TRACT | 0 refills | Status: DC | PRN
  Filled 2021-03-11: qty 8.5, 25d supply, fill #0

## 2021-03-11 MED ORDER — PREDNISONE 10 MG PO TABS
ORAL_TABLET | ORAL | 0 refills | Status: DC
  Filled 2021-03-11: qty 6, 3d supply, fill #0

## 2021-03-11 NOTE — TOC Transition Note (Signed)
Transition of Care [facility]) - CM/SW Discharge Note   Patient Details  Name: Bradley PETTINGILL Sr. MRN: [id number] Date of Birth: Dec 24, 1940  Transition of Care [facility]) CM/SW Contact:  Durenda Guthrie, RN Phone Number: 03/11/2021, 9:43 AM   Clinical Narrative:   Case manager spoke with patient to discuss his concerns about his oxygen tanks. Patient states his portable tanks are too heavy for him, patient's oxygen for home is provided by [employer].   CM contacted  Zack Blank with [employer], they will schedule a oxygen conserving eval to see if patient qualifies for a smaller unit. No further needs identified.            Patient Goals and CMS Choice        Discharge Placement                       Discharge Plan and Services                                     Social Determinants of Health (SDOH) Interventions     Readmission Risk Interventions No flowsheet data found.

## 2021-03-11 NOTE — Care Management Important Message (Signed)
Important Message  Patient Details  Name: Bradley NADEEM Sr. MRN: [id number] Date of Birth: 1941-08-02   Medicare Important Message Given:  Yes - Important Message mailed due to current National Emergency   Verbal consent obtained due to current National Emergency  Relationship to patient: Self Contact Name: Aylen Call Date: 03/11/21  Time: 1259 Phone: [phone] Outcome: No Answer/Busy Important Message mailed to: Patient address on file    Orson Aloe 03/11/2021, 1:00 PM

## 2021-03-11 NOTE — Progress Notes (Signed)
PT Cancellation Note  Patient Details Name: Bradley SMICK Sr. MRN: [id number] DOB: Sep 13, 1941   Cancelled Treatment:    Reason Eval/Treat Not Completed: Other (comment) per RN, patient has already [facility]. Thank you for the opportunity to participate in his care!    Madelaine Etienne, DPT, PN1   Supplemental Physical Therapist [facility]    Pager [phone] Acute Rehab Office [phone]

## 2021-03-11 NOTE — Discharge Summary (Signed)
PATIENT DETAILS Name: Bradley FIGLEY Sr. Age: 80 y.o. Sex: male Date of Birth: 07-06-1941 MRN: [id number]. Admitting Physician: Bradley Deutscher, MD PCP:Paz, Nolon Rod, MD  Admit Date: 03/05/2021 Discharge date: 03/11/2021  Recommendations for Outpatient Follow-up:  1. Follow up with PCP in 1-2 weeks 2. Please obtain CMP/CBC in one week 3. Please repeat two-view chest x-ray in 4 to 6 weeks.  Admitted From:  Home  Disposition: Home   Home Health: No  Equipment/Devices: None  Discharge Condition: Stable  CODE STATUS: FULL CODE  Diet recommendation:  Diet Order            Diet - low sodium heart healthy           Diet Heart Room service appropriate? Yes; Fluid consistency: Thin  Diet effective now                  Brief Narrative: Patient is a 80 y.o. 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  See below for further details.  Significant events: 5/26>> admit for worsening hypoxia due to pneumonia-requiring up to 10 L of HFNC.  Significant studies: 09/03/2020>> Echo: EF 55-60% 03/05/2021>> CT chest: Extensive patchy bronchopneumonia in bilateral lower lobes-left> right 03/09/2021>> CXR: Multifocal pneumonia  Antimicrobial therapy: Rocephin: 5/25>>6/1 Doxycycline: 5/26>>531  Microbiology data: 5/26>> sputum culture: No growth 5/26>> COVID 19 PCR: Negative  Procedures : None  Consults: None   Brief Hospital Course: Acute on respiratory failure due to multifocal pneumonia: Clinically improved-down to usual 2-3 L of oxygen (home regimen).  Has completed a course of empiric Rocephin/doxycycline-do not think he requires any further  antimicrobial therapy on discharge.  PCP to ensure patient gets a two-view chest x-ray in 4 to 6 weeks.    HFpEF with exacerbation: Improved-he is now euvolemic-he has been switched to oral diabetic regimen.  Remains on Coreg/losartan.  Reassess volume status at follow-up with PCP.  COPD exacerbation: Continues to improve-likely triggered by pneumonia-no wheezing-continue tapering prednisone-continue usual bronchodilator regimen.   Chronic atrial fibrillation: Rate controlled-history of AV node ablation.  Continue Coreg and aspirin.  Not a candidate for anticoagulation due to history of ICH.   History of pacemaker implantation  HLD: Continue statin  HTN: BP stable-continue amlodipine, Coreg, losartan  DM-2 (A1c 6.0 on 3/1): CBGs stable on SSI.  Resume oral hypoglycemic agents on discharge  BPH: Continue Flomax  GERD: Continue PPI  Discharge Diagnoses:  Principal Problem:   Acute on chronic respiratory failure with hypoxia (HCC) Active Problems:   DM II (diabetes mellitus, type II), controlled (HCC)   Essential hypertension   Chronic diastolic (congestive) heart failure (HCC)   Pneumonia   Non-ischemic cardiomyopathy (HCC)   COPD GOLD 0 spirometry/ AB    Discharge Instructions:  Activity:  As tolerated   Discharge Instructions    Diet - low sodium heart healthy   Complete by: As directed    Discharge instructions   Complete by: As directed    Follow with Primary MD  Bradley Plump, MD in 1-2 weeks  Please get a complete blood count and chemistry panel checked by your Primary MD at your next visit, and again as instructed by your Primary MD.  Get Medicines reviewed and adjusted: Please  take all your medications with you for your next visit with your Primary MD  Laboratory/radiological data: Please request your Primary MD to go over all hospital tests and procedure/radiological results at the follow up, please ask your Primary MD to get all Hospital records sent to  his/her office.  In some cases, they will be blood work, cultures and biopsy results pending at the time of your discharge. Please request that your primary care M.D. follows up on these results.  Also Note the following: If you experience worsening of your admission symptoms, develop shortness of breath, life threatening emergency, suicidal or homicidal thoughts you must seek medical attention immediately by calling 911 or calling your MD immediately  if symptoms less severe.  You must read complete instructions/literature along with all the possible adverse reactions/side effects for all the Medicines you take and that have been prescribed to you. Take any new Medicines after you have completely understood and accpet all the possible adverse reactions/side effects.   Do not drive when taking Pain medications or sleeping medications (Benzodaizepines)  Do not take more than prescribed Pain, Sleep and Anxiety Medications. It is not advisable to combine anxiety,sleep and pain medications without talking with your primary care practitioner  Special Instructions: If you have smoked or chewed Tobacco  in the last 2 yrs please stop smoking, stop any regular Alcohol  and or any Recreational drug use.  Wear Seat belts while driving.  Please note: You were cared for by a hospitalist during your hospital stay. Once you are discharged, your primary care physician will handle any further medical issues. Please note that NO REFILLS for any discharge medications will be authorized once you are discharged, as it is imperative that you return to your primary care physician (or establish a relationship with a primary care physician if you do not have one) for your post hospital discharge needs so that they can reassess your need for medications and monitor your lab values.   1.)  Please ask your primary care practitioner to repeat a two-view chest x-ray in 4 to 6 weeks.   Increase activity slowly   Complete by:  As directed      Allergies as of 03/11/2021      Reactions   Hydrocodone Other (See Comments)   "given to him in the hospital; went thru withdrawals once home; dr said not to take it again" (09/27/2012)   Tramadol Other (See Comments)   "given to him in the hospital; went thru withdrawals once home; dr said not to take it again" (09/27/2012)   Tadalafil Other (See Comments)   headache, backache   Avelox [moxifloxacin Hydrochloride] Itching, Other (See Comments)   Headache      Medication List    TAKE these medications   acetaminophen 500 MG tablet Commonly known as: TYLENOL Take 500 mg by mouth every 6 (six) hours as needed for headache.   albuterol 108 (90 Base) MCG/ACT inhaler Commonly known as: VENTOLIN HFA Inhale 2 puffs into the lungs every 6 (six) hours as needed for wheezing or shortness of breath.   amLODipine 10 MG tablet Commonly known as: NORVASC Take 1 tablet (10 mg total) by mouth daily.   aspirin 81 MG tablet Take 81 mg by mouth every morning.   atorvastatin 20 MG tablet Commonly known as: LIPITOR Take 1 tablet (20 mg total) by mouth at bedtime.   budesonide 0.5 MG/2ML nebulizer solution Commonly known as: PULMICORT Take 2 mLs (0.5 mg total) by nebulization  2 (two) times daily.   calcium-vitamin D 500-200 MG-UNIT tablet Commonly known as: OSCAL WITH D Take 1 tablet by mouth every morning.   carvedilol 25 MG tablet Commonly known as: COREG Take 1 tablet (25 mg total) by mouth 2 (two) times daily with a meal.   furosemide 40 MG tablet Commonly known as: LASIX TAKE 2 TABLETS DAILY EXCEPT ON MONDAY, WEDNESDAY, AND FRIDAY TAKE 2 TABLETS IN THE MORNING A 1 TABLET IN THE AFTERNOON What changed:   how much to take  how to take this  when to take this  additional instructions   ipratropium-albuterol 0.5-2.5 (3) MG/3ML Soln Commonly known as: DUONEB Take 3 mLs by nebulization 2 (two) times daily as needed. What changed: reasons to take this    losartan 50 MG tablet Commonly known as: COZAAR Take 1 tablet (50 mg total) by mouth daily.   multivitamins ther. w/minerals Tabs tablet Take 1 tablet by mouth daily.   nitroGLYCERIN 0.4 MG SL tablet Commonly known as: NITROSTAT Place 1 tablet (0.4 mg total) under the tongue every 5 (five) minutes x 3 doses as needed for chest pain.   onetouch ultrasoft lancets Check blood sugars no more than twice daily   OneTouch Verio test strip Generic drug: glucose blood USE TO CHECK BLOOD SUGAR NO MORE THAN TWICE A DAY   pantoprazole 40 MG tablet Commonly known as: PROTONIX Take 40 mg by mouth daily.   predniSONE 10 MG tablet Commonly known as: DELTASONE Take 30 mg daily for 1 day, 20 mg daily for 1 days,10 mg daily for 1 day, then stop   sitaGLIPtin 100 MG tablet Commonly known as: Januvia Take 1 tablet (100 mg total) by mouth daily.   tamsulosin 0.4 MG Caps capsule Commonly known as: FLOMAX Take 1 capsule (0.4 mg total) by mouth daily after supper.   Tylenol PM Extra Strength 25-500 MG Tabs tablet Generic drug: diphenhydramine-acetaminophen Take 1 tablet by mouth at bedtime.       Follow-up Information    Bradley Plump, MD. Schedule an appointment as soon as possible for a visit in 1 week(s).   Specialty: Internal Medicine Contact information: [address] [place] [address] [phone]        [name], MD Follow up in 1 month(s).   Specialty: Cardiology Contact information: [address] [place] [address] [place] [address] [phone]              Allergies  Allergen Reactions  . Hydrocodone Other (See Comments)    "given to him in the hospital; went thru withdrawals once home; dr said not to take it again" (09/27/2012)  . Tramadol Other (See Comments)    "given to him in the hospital; went thru withdrawals once home; dr said not to take it again" (09/27/2012)  . Tadalafil Other (See Comments)    headache, backache  . Avelox  [Moxifloxacin Hydrochloride] Itching and Other (See Comments)    Headache    Other Procedures/Studies: CT CHEST WO CONTRAST  Result Date: 03/05/2021 CLINICAL DATA:  Abnormal density at the left lung base by radiography. EXAM: CT CHEST WITHOUT CONTRAST TECHNIQUE: Multidetector CT imaging of the chest was performed following the standard protocol without IV contrast. COMPARISON:  Chest radiography same day.  Previous CT 09/13/2019 FINDINGS: Cardiovascular: Cardiomegaly. Coronary artery calcification. Aortic atherosclerotic calcification. Dual lead pacemaker in place. No pericardial fluid. Mediastinum/Nodes: No mediastinal or hilar mass. Mild reactive prominence of the lymph nodes. Lungs/Pleura: Patchy bronchopneumonia  in both lower lobes, more extensive on the left than the right. Small amount pleural fluid on the right. Upper Abdomen: Relative elevation of the left hemidiaphragm. Upper abdomen appears negative. Musculoskeletal: Ordinary mild degenerative changes of the spine. IMPRESSION: Fairly extensive patchy bronchopneumonia in both lower lobes, left worse than right. Aortic Atherosclerosis (ICD10-I70.0). Electronically Signed   By: Paulina Fusi M.D.   On: 03/05/2021 11:22   DG CHEST PORT 1 VIEW  Result Date: 03/09/2021 CLINICAL DATA:  Shortness of breath EXAM: PORTABLE CHEST 1 VIEW COMPARISON:  03/05/2021 FINDINGS: Multifocal patchy opacities in the lingula and bilateral lower lobes, left lower lobe predominant, suspicious for multifocal pneumonia. This is mildly progressive from the prior. No pleural effusion or pneumothorax. Cardiomegaly. Left subclavian pacemaker. Thoracic aortic atherosclerosis. IMPRESSION: Multifocal pneumonia, left lower lobe predominant, mildly progressive. Electronically Signed   By: Charline Bills M.D.   On: 03/09/2021 08:17   DG Chest Portable 1 View  Result Date: 03/05/2021 CLINICAL DATA:  Dyspnea, COPD, CHF EXAM: PORTABLE CHEST 1 VIEW COMPARISON:  07/28/2020  FINDINGS: The lungs are symmetrically expanded. A focal peripheral infiltrate is seen within the a left lung base, possibly infectious or inflammatory in the acute setting. An underlying mass lesion could appear similarly. No pneumothorax or pleural effusion. Mild cardiomegaly is stable. Left subclavian pacemaker with 2 leads extending into the right ventricle are unchanged. Pulmonary vascularity is normal. No acute bone abnormality., IMPRESSION: Left basilar focal pulmonary infiltrate, possibly infectious or inflammatory. Follow-up chest radiograph is recommended in 3-4 weeks to document resolution. Electronically Signed   By: Helyn Numbers MD   On: 03/05/2021 01:18   CUP PACEART REMOTE DEVICE CHECK  Result Date: 03/03/2021 Scheduled remote reviewed. 1 NS-VT event 7 beats.  Normal device function.  RP Next remote 91 days.    TODAY-DAY OF DISCHARGE:  Subjective:   Bradley [name] today has no headache,no chest abdominal pain,no new weakness tingling or numbness, feels much better wants to go home today.  Objective:   Blood pressure 119/71, pulse 71, temperature 97.9 F (36.6 C), temperature source Axillary, resp. rate 20, height 5\' 9"  (1.753 m), weight 82.4 kg, SpO2 98 %.  Intake/Output Summary (Last 24 hours) at 03/11/2021 0901 Last data filed at 03/11/2021 0742 Gross per 24 hour  Intake 980 ml  Output 1650 ml  Net -670 ml   Filed Weights   03/08/21 0500 03/09/21 0500 03/10/21 0500  Weight: 82.8 kg 84.8 kg 82.4 kg    Exam: Awake Alert, Oriented *3, No new F.N deficits, Normal affect [place].AT,PERRAL Supple Neck,No JVD, No cervical lymphadenopathy appriciated.  Symmetrical Chest wall movement, Good air movement bilaterally, CTAB RRR,No Gallops,Rubs or new Murmurs, No Parasternal Heave +ve B.Sounds, Abd Soft, Non tender, No organomegaly appriciated, No rebound -guarding or rigidity. No Cyanosis, Clubbing or edema, No new Rash or bruise   PERTINENT RADIOLOGIC STUDIES: No results  found.   PERTINENT LAB RESULTS: CBC: Recent Labs    03/09/21 0040 03/10/21 0048  WBC 6.5 7.9  HGB 11.2* 11.9*  HCT 33.7* 35.3*  PLT 188 241   CMET CMP     Component Value Date/Time   NA 137 03/10/2021 0048   K 3.8 03/10/2021 0048   CL 90 (L) 03/10/2021 0048   CO2 39 (H) 03/10/2021 0048   GLUCOSE 132 (H) 03/10/2021 0048   GLUCOSE 118 (H) 10/18/2006 0000   BUN 17 03/10/2021 0048   CREATININE 0.77 03/10/2021 0048   CREATININE 0.93 07/28/2020 1026   CALCIUM 8.6 (L)  03/10/2021 0048   PROT 6.8 03/05/2021 0103   ALBUMIN 3.5 03/05/2021 0103   AST 18 03/05/2021 0103   ALT 13 03/05/2021 0103   ALKPHOS 82 03/05/2021 0103   BILITOT 0.9 03/05/2021 0103   GFRNONAA >60 03/10/2021 0048   GFRAA >60 09/18/2019 0353    GFR Estimated Creatinine Clearance: 73.6 mL/min (by C-G formula based on SCr of 0.77 mg/dL). No results for input(s): LIPASE, AMYLASE in the last 72 hours. No results for input(s): CKTOTAL, CKMB, CKMBINDEX, TROPONINI in the last 72 hours. Invalid input(s): POCBNP No results for input(s): DDIMER in the last 72 hours. No results for input(s): HGBA1C in the last 72 hours. No results for input(s): CHOL, HDL, LDLCALC, TRIG, CHOLHDL, LDLDIRECT in the last 72 hours. No results for input(s): TSH, T4TOTAL, T3FREE, THYROIDAB in the last 72 hours.  Invalid input(s): FREET3 No results for input(s): VITAMINB12, FOLATE, FERRITIN, TIBC, IRON, RETICCTPCT in the last 72 hours. Coags: No results for input(s): INR in the last 72 hours.  Invalid input(s): PT Microbiology: Recent Results (from the past 240 hour(s))  SARS CORONAVIRUS 2 (TAT 6-24 HRS) Nasopharyngeal Nasopharyngeal Swab     Status: None   Collection Time: 03/05/21  5:45 AM   Specimen: Nasopharyngeal Swab  Result Value Ref Range Status   SARS Coronavirus 2 NEGATIVE NEGATIVE Final    Comment: (NOTE) SARS-CoV-2 target nucleic acids are NOT DETECTED.  The SARS-CoV-2 RNA is generally detectable in upper and  lower respiratory specimens during the acute phase of infection. Negative results do not preclude SARS-CoV-2 infection, do not rule out co-infections with other pathogens, and should not be used as the sole basis for treatment or other patient management decisions. Negative results must be combined with clinical observations, patient history, and epidemiological information. The expected result is Negative.  Fact Sheet for Patients: HairSlick.no  Fact Sheet for Healthcare Providers: quierodirigir.com  This test is not yet approved or cleared by the [place] FDA and  has been authorized for detection and/or diagnosis of SARS-CoV-2 by FDA under an Emergency Use Authorization (EUA). This EUA will remain  in effect (meaning this test can be used) for the duration of the COVID-19 declaration under Se ction 564(b)(1) of the Act, 21 U.S.C. section 360bbb-3(b)(1), unless the authorization is terminated or revoked sooner.  Performed at [facility], [address], [place], [place] [address]   Expectorated Sputum Assessment w Gram Stain, Rflx to Resp Cult     Status: None   Collection Time: 03/05/21  5:35 PM   Specimen: Expectorated Sputum  Result Value Ref Range Status   Specimen Description EXPECTORATED SPUTUM  Final   Special Requests NONE  Final   Sputum evaluation   Final    THIS SPECIMEN IS ACCEPTABLE FOR SPUTUM CULTURE Performed at [facility], [address], [place], [place] [address]    Report Status 03/05/2021 FINAL  Final  Culture, Respiratory w Gram Stain     Status: None   Collection Time: 03/05/21  5:35 PM  Result Value Ref Range Status   Specimen Description EXPECTORATED SPUTUM  Final   Special Requests NONE Reflexed from [id number]  Final   Gram Stain   Final    ABUNDANT WBC PRESENT,BOTH PMN AND MONONUCLEAR RARE GRAM POSITIVE COCCI RARE GRAM VARIABLE [name]    Culture   Final    RARE Normal  respiratory flora-no Staph aureus or Pseudomonas seen Performed at [facility], [address], [place], [place] [address]  Report Status 03/08/2021 FINAL  Final  MRSA PCR Screening     Status: None   Collection Time: 03/07/21  9:55 PM   Specimen: Nasal Mucosa; Nasopharyngeal  Result Value Ref Range Status   MRSA by PCR NEGATIVE NEGATIVE Final    Comment:        The GeneXpert MRSA Assay (FDA approved for NASAL specimens only), is one component of a comprehensive MRSA colonization surveillance program. It is not intended to diagnose MRSA infection nor to guide or monitor treatment for MRSA infections. Performed at [facility], [address] [place], [place] [address]     FURTHER DISCHARGE INSTRUCTIONS:  Get Medicines reviewed and adjusted: Please take all your medications with you for your next visit with your Primary MD  Laboratory/radiological data: Please request your Primary MD to go over all hospital tests and procedure/radiological results at the follow up, please ask your Primary MD to get all Hospital records sent to his/her office.  In some cases, they will be blood work, cultures and biopsy results pending at the time of your discharge. Please request that your primary care M.D. goes through all the records of your hospital data and follows up on these results.  Also Note the following: If you experience worsening of your admission symptoms, develop shortness of breath, life threatening emergency, suicidal or homicidal thoughts you must seek medical attention immediately by calling 911 or calling your MD immediately  if symptoms less severe.  You must read complete instructions/literature along with all the possible adverse reactions/side effects for all the Medicines you take and that have been prescribed to you. Take any new Medicines after you have completely understood and accpet all the possible adverse reactions/side effects.   Do not drive when  taking Pain medications or sleeping medications (Benzodaizepines)  Do not take more than prescribed Pain, Sleep and Anxiety Medications. It is not advisable to combine anxiety,sleep and pain medications without talking with your primary care practitioner  Special Instructions: If you have smoked or chewed Tobacco  in the last 2 yrs please stop smoking, stop any regular Alcohol  and or any Recreational drug use.  Wear Seat belts while driving.  Please note: You were cared for by a hospitalist during your hospital stay. Once you are discharged, your primary care physician will handle any further medical issues. Please note that NO REFILLS for any discharge medications will be authorized once you are discharged, as it is imperative that you return to your primary care physician (or establish a relationship with a primary care physician if you do not have one) for your post hospital discharge needs so that they can reassess your need for medications and monitor your lab values.  Total Time spent coordinating discharge including counseling, education and face to face time equals 35 minutes  Signed: Altonio Schwertner 03/11/2021 9:01 AM

## 2021-03-12 ENCOUNTER — Telehealth: Payer: Self-pay

## 2021-03-12 NOTE — Telephone Encounter (Signed)
Transition Care Management Unsuccessful Follow-up Telephone Call  Date of discharge and from where:  03/11/21 from [facility]  Attempts:  1st Attempt  Reason for unsuccessful TCM follow-up call:  Unable to reach patient Hospital follow up scheduled 03/17/21 @ 11:20

## 2021-03-13 ENCOUNTER — Other Ambulatory Visit: Payer: Self-pay | Admitting: Internal Medicine

## 2021-03-16 NOTE — Telephone Encounter (Signed)
Transition Care Management Unsuccessful Follow-up Telephone Call  Date of discharge and from where:  03/11/21 from [facility]  Attempts:  2nd Attempt  Reason for unsuccessful TCM follow-up call:  Unable to leave message. HFU scheduled 03/17/21 @ 11:20.

## 2021-03-17 ENCOUNTER — Ambulatory Visit (INDEPENDENT_AMBULATORY_CARE_PROVIDER_SITE_OTHER): Payer: Medicare Other | Admitting: Internal Medicine

## 2021-03-17 ENCOUNTER — Other Ambulatory Visit: Payer: Self-pay

## 2021-03-17 ENCOUNTER — Encounter: Payer: Self-pay | Admitting: Internal Medicine

## 2021-03-17 VITALS — BP 118/70 | HR 91 | Temp 97.0°F | Resp 17 | Ht 69.0 in | Wt 184.0 lb

## 2021-03-17 DIAGNOSIS — E118 Type 2 diabetes mellitus with unspecified complications: Secondary | ICD-10-CM | POA: Diagnosis not present

## 2021-03-17 DIAGNOSIS — J9612 Chronic respiratory failure with hypercapnia: Secondary | ICD-10-CM | POA: Diagnosis not present

## 2021-03-17 DIAGNOSIS — I10 Essential (primary) hypertension: Secondary | ICD-10-CM

## 2021-03-17 DIAGNOSIS — J9611 Chronic respiratory failure with hypoxia: Secondary | ICD-10-CM

## 2021-03-17 LAB — CBC WITH DIFFERENTIAL/PLATELET
Basophils Absolute: 0 K/uL (ref 0.0–0.1)
Basophils Relative: 0.3 % (ref 0.0–3.0)
Eosinophils Absolute: 0 K/uL (ref 0.0–0.7)
Eosinophils Relative: 0.3 % (ref 0.0–5.0)
HCT: 41.1 % (ref 39.0–52.0)
Hemoglobin: 13.8 g/dL (ref 13.0–17.0)
Lymphocytes Relative: 13.5 % (ref 12.0–46.0)
Lymphs Abs: 1.4 K/uL (ref 0.7–4.0)
MCHC: 33.4 g/dL (ref 30.0–36.0)
MCV: 92.2 fl (ref 78.0–100.0)
Monocytes Absolute: 0.2 K/uL (ref 0.1–1.0)
Monocytes Relative: 2.2 % — ABNORMAL LOW (ref 3.0–12.0)
Neutro Abs: 8.9 K/uL — ABNORMAL HIGH (ref 1.4–7.7)
Neutrophils Relative %: 83.7 % — ABNORMAL HIGH (ref 43.0–77.0)
Platelets: 236 K/uL (ref 150.0–400.0)
RBC: 4.46 Mil/uL (ref 4.22–5.81)
RDW: 14.6 % (ref 11.5–15.5)
WBC: 10.6 K/uL — ABNORMAL HIGH (ref 4.0–10.5)

## 2021-03-17 NOTE — Patient Instructions (Signed)
   GO TO THE LAB : Get the blood work     Come to the first floor and get a chest x-ray by 04-07-2021.  You do not need an appointment

## 2021-03-17 NOTE — Progress Notes (Signed)
Subjective:    Patient ID: Bradley [name]., male    DOB: 07/15/1941, 80 y.o.   MRN: [id number]  DOS:  03/17/2021 Type of visit - description: Hospital follow-up  Admitted to the hospital 03/05/2021, discharge 03/11/2021. He presented with shortness of breath, Dx with acute on chronic hypoxic respiratory failure due to community-acquired pneumonia. Also dx w/  COPD exacerbation and decompensated heart failure. He required up to 10 L of  HFNC.  Completed antibiotics, eventually he went down to his normal oxygen requirements.  CHF: He become euvolemic before discharge.   Review of Systems Since he left the hospital he is doing well, back home. He feels that his breathing is back to baseline. Denies fever chills No lower extremity edema No nausea, vomiting, diarrhea.  No blood in the stools. No cough   Past Medical History:  Diagnosis Date   Abnormal CT scan, chest 2012   CT chest, several lymphadenopathies. Sees pulmonary   Anemia    intermittent   Arthritis    "?back" (09/19/2018)   Atrial fibrillation (HCC)    s/p AV node ablation & BiV PPM implantation 09/08/11 (op dictation pending)   BPH (benign prostatic hyperplasia)    Saw Dr [name] 2004, normal renal u/s   Bronchitis 08/26/2017   CAD (coronary artery disease)    CHF (congestive heart failure) (HCC)    Thought primarily to be non-systolic although EF down (EF 61-60% 12/2010, down to 35-40% 09/05/11), cath 2008 with no CAD, nuclear study 07/2011 showing Small area of reversibility in the distal ant/lat wall the left ventricle suspicious for ischemia/septal wall HK but felt to be low risk  (per D/C Summary 07/2011)   Chronic bronchitis (HCC)    "get it ~ q yr"   Heart murmur    HLD (hyperlipidemia)    HTN (hypertension)    ICB (intracranial bleed) (HCC) 06/2012   d/c coumadin permanently   Insomnia    Migraines    "very very rare"   On home oxygen therapy    "2L; 24/7" (09/19/2018)   Pacemaker    Peripheral  vascular disease (HCC)    ??   Pleural effusion 2008   S/p decortication   Pulmonary HTN (HCC)    per cath 2008   Type II diabetes mellitus (HCC) 1999    Past Surgical History:  Procedure Laterality Date   BI-VENTRICULAR PACEMAKER INSERTION Left 09/08/2011   Procedure: BI-VENTRICULAR PACEMAKER INSERTION (CRT-P);  Surgeon: Marinus Maw, MD;  Location: [facility] CATH LAB;  Service: Cardiovascular;  Laterality: Left;   CARDIAC CATHETERIZATION     "couple times; never had balloon or stent" (09/19/2018)   CATARACT EXTRACTION W/ INTRAOCULAR LENS  IMPLANT, BILATERAL Bilateral 08/2018   COLONOSCOPY  03/10/11   normal   INSERT / REPLACE / REMOVE PACEMAKER  09/08/11   pacemaker placement   LUNG DECORTICATION     PLEURAL SCARIFICATION     pneumothorax with fibrothorax  ~ 2010   TONSILLECTOMY     "as a kid"     Allergies as of 03/17/2021       Reactions   Hydrocodone Other (See Comments)   "given to him in the hospital; went thru withdrawals once home; dr said not to take it again" (09/27/2012)   Tramadol Other (See Comments)   "given to him in the hospital; went thru withdrawals once home; dr said not to take it again" (09/27/2012)   Tadalafil Other (See Comments)   headache, backache   Avelox [  moxifloxacin Hydrochloride] Itching, Other (See Comments)   Headache        Medication List        Accurate as of March 17, 2021 11:59 PM. If you have any questions, ask your nurse or doctor.          STOP taking these medications    predniSONE 10 MG tablet Commonly known as: DELTASONE Stopped by: [name], MD       TAKE these medications    acetaminophen 500 MG tablet Commonly known as: TYLENOL Take 500 mg by mouth every 6 (six) hours as needed for headache.   albuterol 108 (90 Base) MCG/ACT inhaler Commonly known as: VENTOLIN HFA Inhale 2 puffs into the lungs every 6 (six) hours as needed for wheezing or shortness of breath.   amLODipine 10 MG tablet Commonly known as:  NORVASC Take 1 tablet (10 mg total) by mouth daily.   aspirin 81 MG tablet Take 81 mg by mouth every morning.   atorvastatin 20 MG tablet Commonly known as: LIPITOR Take 1 tablet (20 mg total) by mouth at bedtime.   budesonide 0.5 MG/2ML nebulizer solution Commonly known as: PULMICORT Take 2 mLs (0.5 mg total) by nebulization 2 (two) times daily.   calcium-vitamin D 500-200 MG-UNIT tablet Commonly known as: OSCAL WITH D Take 1 tablet by mouth every morning.   carvedilol 25 MG tablet Commonly known as: COREG Take 1 tablet (25 mg total) by mouth 2 (two) times daily with a meal.   furosemide 40 MG tablet Commonly known as: LASIX TAKE 2 TABLETS DAILY EXCEPT ON MONDAY, WEDNESDAY, AND FRIDAY TAKE 2 TABLETS IN THE MORNING A 1 TABLET IN THE AFTERNOON What changed:  how much to take how to take this when to take this additional instructions   ipratropium-albuterol 0.5-2.5 (3) MG/3ML Soln Commonly known as: DUONEB Take 3 mLs by nebulization 2 (two) times daily as needed. What changed: reasons to take this   losartan 50 MG tablet Commonly known as: COZAAR Take 1 tablet (50 mg total) by mouth daily.   multivitamins ther. w/minerals Tabs tablet Take 1 tablet by mouth daily.   nitroGLYCERIN 0.4 MG SL tablet Commonly known as: NITROSTAT Place 1 tablet (0.4 mg total) under the tongue every 5 (five) minutes x 3 doses as needed for chest pain.   onetouch ultrasoft lancets Check blood sugars no more than twice daily   OneTouch Verio test strip Generic drug: glucose blood USE TO CHECK BLOOD SUGAR NO MORE THAN TWICE A DAY   pantoprazole 40 MG tablet Commonly known as: PROTONIX Take 40 mg by mouth daily.   sitaGLIPtin 100 MG tablet Commonly known as: Januvia Take 1 tablet (100 mg total) by mouth daily.   tamsulosin 0.4 MG Caps capsule Commonly known as: FLOMAX Take 1 capsule (0.4 mg total) by mouth daily after supper.   Tylenol PM Extra Strength 25-500 MG Tabs  tablet Generic drug: diphenhydramine-acetaminophen Take 1 tablet by mouth at bedtime.           Objective:   Physical Exam BP 118/70 (BP Location: Right Arm, Patient Position: Sitting, Cuff Size: Normal)   Pulse 91   Temp (!) 97 F (36.1 C)   Resp 17   Ht 5\' 9"  (1.753 m)   Wt 184 lb (83.5 kg)   SpO2 95%   BMI 27.17 kg/m  General:   Well developed, NAD, BMI noted. HEENT:  Normocephalic . Face symmetric, atraumatic Lungs:  Has portable O2. Breath sounds severely  decreased Normal respiratory effort, no intercostal retractions, no accessory muscle use. Heart: RRR,  no murmur.  Lower extremities: no pretibial edema bilaterally  Skin: Not pale. Not jaundice Neurologic:  alert & oriented X3.  Speech normal, gait appropriate for age and unassisted Psych--  Cognition and judgment appear intact.  Cooperative with normal attention span and concentration.  Behavior appropriate. No anxious or depressed appearing.      Assessment       Assessment  DM HTN Hyperlipidemia GERD Anxiety- insomnia  BPH Cardiovascular:Dr Ladona Ridgel  --  Cath x 2 [id number]: no significant CAD -- CHF, non-ischemic  --Atrial fibrillation; h/o AV node ablation, has a Pacemaker --h/o IC bleeding: Not anticoagulated --Peripheral vascular disease? Pulmonary: Dr [name], last OV 10-18-2014, f/u prn --COPD, severe, chronic resp failure, night O2 prn, has portable O2  --Pleural effusion s/p decortication --Pulmonary hypertension  Stasis dermatitis   PLAN:  TCM 14 Acute on chronic hypoxic respiratory failure: Recently admitted to the hospital with pneumonia, completed antibiotics, required high flow oxygen temporarily, back to baseline. Medications reviewed: On his usual medications plus albuterol inhaler, finished  prednisone Get  BMP, CBC. Chest x-ray in 3 weeks DM: Continue Januvia, well controlled. HTN: On amlodipine, carvedilol, losartan.  Seems controlled. COVID-vaccine: Recommend a  booster. RTC already scheduled for July.   This visit occurred during the SARS-CoV-2 public health emergency.  Safety protocols were in place, including screening questions prior to the visit, additional usage of staff PPE, and extensive cleaning of exam room while observing appropriate contact time as indicated for disinfecting solutions.

## 2021-03-18 ENCOUNTER — Ambulatory Visit: Payer: Medicare Other | Attending: Internal Medicine

## 2021-03-18 ENCOUNTER — Ambulatory Visit (HOSPITAL_BASED_OUTPATIENT_CLINIC_OR_DEPARTMENT_OTHER)
Admission: RE | Admit: 2021-03-18 | Discharge: 2021-03-18 | Disposition: A | Discharge status: Home / Self Care | Payer: Medicare Other | Source: Ambulatory Visit | Attending: Internal Medicine | Admitting: Internal Medicine

## 2021-03-18 DIAGNOSIS — J9611 Chronic respiratory failure with hypoxia: Secondary | ICD-10-CM | POA: Diagnosis not present

## 2021-03-18 DIAGNOSIS — J9612 Chronic respiratory failure with hypercapnia: Secondary | ICD-10-CM | POA: Diagnosis present

## 2021-03-18 DIAGNOSIS — Z23 Encounter for immunization: Secondary | ICD-10-CM

## 2021-03-18 LAB — BASIC METABOLIC PANEL
BUN: 17 mg/dL (ref 6–23)
CO2: 31 mEq/L (ref 19–32)
Calcium: 8.8 mg/dL (ref 8.4–10.5)
Chloride: 99 mEq/L (ref 96–112)
Creatinine, Ser: 0.87 mg/dL (ref 0.40–1.50)
GFR: 81.73 mL/min (ref >60.00)
Glucose, Bld: 207 mg/dL — ABNORMAL HIGH (ref 70–99)
Potassium: 4.7 mEq/L (ref 3.5–5.1)
Sodium: 137 mEq/L (ref 135–145)

## 2021-03-18 NOTE — Progress Notes (Signed)
   [id number]  Name:  Bradley BECRAFT Sr.    MRN: [id number] DOB: 07-15-1941  03/18/2021  Bradley [name] was observed post Covid-19 immunization for 15 minutes without incident. He was provided with Vaccine Information Sheet and instruction to access the V-Safe system.   Bradley [name] was instructed to call 911 with any severe reactions post vaccine: Marland Kitchen Difficulty breathing  . Swelling of face and throat  . A fast heartbeat  . A bad rash all over body  . Dizziness and weakness   Immunizations Administered    Name Date Dose VIS Date Route   PFIZER Comrnaty(Gray TOP) Covid-19 Vaccine 03/18/2021 10:33 AM 0.3 mL 09/18/2020 Intramuscular   Manufacturer: [employer], [employer]   Lot: [id number]   NDC: [phone]

## 2021-03-19 ENCOUNTER — Ambulatory Visit (INDEPENDENT_AMBULATORY_CARE_PROVIDER_SITE_OTHER): Payer: Medicare Other | Admitting: Internal Medicine

## 2021-03-19 ENCOUNTER — Encounter: Payer: Self-pay | Admitting: Internal Medicine

## 2021-03-19 ENCOUNTER — Other Ambulatory Visit (HOSPITAL_BASED_OUTPATIENT_CLINIC_OR_DEPARTMENT_OTHER): Payer: Self-pay

## 2021-03-19 ENCOUNTER — Other Ambulatory Visit: Payer: Self-pay

## 2021-03-19 DIAGNOSIS — J449 Chronic obstructive pulmonary disease, unspecified: Secondary | ICD-10-CM

## 2021-03-19 DIAGNOSIS — J9612 Chronic respiratory failure with hypercapnia: Secondary | ICD-10-CM | POA: Diagnosis not present

## 2021-03-19 DIAGNOSIS — J9611 Chronic respiratory failure with hypoxia: Secondary | ICD-10-CM | POA: Diagnosis not present

## 2021-03-19 DIAGNOSIS — I2781 Cor pulmonale (chronic): Secondary | ICD-10-CM

## 2021-03-19 MED ORDER — COVID-19 MRNA VAC-TRIS(PFIZER) 30 MCG/0.3ML IM SUSP
INTRAMUSCULAR | 0 refills | Status: DC
  Filled 2021-03-19: qty 0.3, 1d supply, fill #0

## 2021-03-19 NOTE — Patient Instructions (Addendum)
Budesonide and duoneb twice daily and in between just the duoneb if you feel like you need it especially before heavy exertion   Make sure you check your oxygen saturation  at highest level of activity to be sure it stays over 90% and adjust  02 flow upward to maintain this level if needed but remember to turn it back to previous settings when you stop (to conserve your supply  - the flowest flow that keeps you above 90% is the best)    If you are satisfied with your treatment plan,  let your doctor know and he/she can either refill your medications or you can return here when your prescription runs out.     If in any way you are not 100% satisfied,  please tell us.  If 100% better, tell your friends!  Pulmonary follow up is as needed

## 2021-03-19 NOTE — Progress Notes (Signed)
Remote pacemaker transmission.   

## 2021-03-19 NOTE — Progress Notes (Signed)
Subjective:    Patient ID: Bradley [name], male    DOB: 11/11/1940     MRN: [id number]   Brief patient profile:  80  yowm  From [place] in 1982 prev pulm  eval in 2008 with pleural effusions but all better with no resp issues until Aug 2012 when dx with bronchitis and "downhill since" so referred by Dr Drue [name] back to pulmonary clinic in Nov 2012 and ultimately proven to have no sign copd by pft's 10/28/2011 and sob attributed to chf.   History of Present Illness  08/27/2011 1st pulmonary eval in emr era cc new onset cough indolent persistent, daily x 3 months,  typically p supper with minimal yellow mucus  Assoc with new sob x sev flights and stop at top of 3 rd -  50% better better p rx with pna no better with albuterol.  No overt sinus or reflux symptoms rec Prednisone 10 mg take  4 each am x 2 days,   2 each am x 2 days,  1 each am x2days and stop  Change lopressor to 50mg  one half in am and one half in pm Protonix 30-60 min before bfast and Pepcid 20mg  one at bedtime  GERD diet   Admit date: 09/20/2011  Discharge date: 09/23/2011  Discharge Diagnoses  Systolic CHF, acute on chronic  Permanent atrial fibrillation  Hypokalemia  Lymphadenopathy  09/24/2011 [facility] Hospital follow-up. Was hospitalized for trouble breathing. Patient states he is better since getting out of the hospital.  rec Only use your albuterol (proventil or ventolin)  as a rescue medication to be used if you can't catch your breath > not using  Keep on 02 24 hours per day @ 2lpm     10/28/2011 f/u ov/Bradley [name] cc Shortness of Breath Improved since last visit on 02 and with rx of chf, no cough or daytime need for saba - on 02 can walk flat and slow as much as desired rec Stay on 02 24 hours per day - this will help your heart You do not have significant lung problems other than what is related to the congestive heart failure so pulmonary follow up will be as needed     Date of Admission: 08/22/2014 Date of  Discharge: 08/25/2014  Attending Physician:[name],Bradley T  Patient's WUJ:[id number]:Bradley [name], MD  Consults:  PCCM  Disposition: D/C home   Discharge Diagnoses: Acute on chronic resp failure with hypoxia SSCP Hx of COPD   Chronic combined diastolic and systolic CHF   Hx afib s/p ablation w/ pacer Nov 2012 HTN HLD DM2  Initial presentation: 80 y.o. male with a hx of HTN, HLD, and systolic and diastolic chf who presented from outpatient clinic after failing tx for bronchitis with doxycycline. Pt reportedly progressively became more dyspneic and sob. A cxr was found to be without evidence of PNA or pulm edema. He was noted to have O2 sats in the mid-80's on O2. The patient was then directly admitted to stepdown.    Hospital Course:  Acute on chronic resp failure with hypoxia Felt to be due to AECOPD - tapered steroids to oral pred from IV solumedrol - weaned O2 to home dose of 2L w/ sats 90% or > - cleared for d/c home w/ completion of steroid taper and oral abx tx - spiriva and albuterol per PCCM recs  SSCP Likely situational stress related (family argument), but pt high risk for CAD - EKGs unrevealing - cycled enzymes all negative - sx resolved  at time of d/c   Hx of COPD   Has not smoked in 30 years, but was a "very heavy" smoker - on 2LPM O2 at all times at home   Chronic combined diastolic and systolic CHF   EF 45-50% via TTE Sept 2013 - repeat TTE notes improvement in EF - no clinical signs of CHF exac  Hx afib s/p ablation w/ pacer Nov 2012 Followed by Bradley [name] EP  HTN BP well controlled at present   HLD Resume advicor   DM2 CBG uncontrolled in setting of steroid use - taper steroids quickly and resume usual tx at home post d/c   09/06/2014 post hosp f/u/ transition of care ov / re-establish care / /Bradley [name] / newly started on spiriva despite nl baseline pfts  Chief Complaint  Patient presents with   Hospitalization Follow-up    Feeling well since leaving the hospital.  Reports coughing from time to time, when he does it came be dry but at times it can be productive. Denies chest tightness, SOB or wheezing.  breathing much better now= back to baseline, ok x 3 flights/ uses 2lpm when walking dog and sometimes shopping    At baseline before deterioration rarely needed any albuterol at all ? How compliant he was with prev 02 recs Main c/o is day > noct cough dry > wet still present and at times quite severe  rec Stop spiriva as you have no copd  Only use your albuterol (proair)  Pantoprazole (protonix) 40 mg   Take 30-60 min before first meal of the day and Pepcid ac  20 mg one bedtime until return to office - this is the best way to tell whether stomach acid is contributing to your problem.  GERD diet    10/18/2014 f/u ov/Bradley [name] re:  Mild intermittent asthma vs GOLD 0 COPD Chief Complaint  Patient presents with   Follow-up    PFT done today. Breathing is "excellent".  No co's today.     no need for any gerd rx or inhalers x one month  rec In the event of a recurrent cough please Try prilosec 20mg   Take 30-60 min before first meal of the day and Pepcid 20 mg one bedtime until cough is completely gone    08/04/2020  Re-establish  ov/Bradley [name] re: copd 0 vs mild intermittent asthma on duoneb /pulmicort but not clear how / when takes this or any other med  Chief Complaint  Patient presents with   Follow-up    DOE  Dyspnea:  MMRC2 = can't walk a nl pace on a flat grade s sob but does fine slow and flat  Cough: slt yellow / more as day day goes on Sleeping:  Flat bed / one pillow  SABA use: ? Exactly how much  02: started 2012 but for the last 2 years has been on 3-4 lpm with sats mid 90s  rec Take both budesonide and duoneb twice daily and in between just the duoneb if you feel like you need it Please schedule a follow up office visit in 6 weeks,  with pfts and bring all medications and solutions  Late Add: Needs echo > c/w cor pulmonale consider for HRCT and  change coreg to bisoprolol ?     09/18/2020  f/u ov/Bradley [name] re: GOLD 0  AB/ cor pulmonale on neb bid  Chief Complaint  Patient presents with   Follow-up    Breathing is overall doing well. He had PFT done today. No new  co's. He [place] using his Duoneb twice daily.   Dyspnea:  Shopping / up steps/ mailbox is flat 100 ft / not checking sats  Cough: none  Sleeping: flat bed/ one pillows  SABA use: never extra  02: sleeps on 2lpm /   2-3 POC  Rec Budesonide and duoneb twice daily and in between just the duoneb if you feel like you need it especially before heavy exertion  Make sure you check your oxygen saturation  at highest level of activity   Admit Date: 03/05/2021 Discharge date: 03/11/2021     Brief Narrative: Patient is a 80 y.o. 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  See below for further details.   Significant events: 5/26>> admit for worsening hypoxia due to pneumonia-requiring up to 10 L of HFNC.   Significant studies: 09/03/2020>> Echo: EF 55-60% 03/05/2021>> CT chest: Extensive patchy bronchopneumonia in bilateral lower lobes-left> right 03/09/2021>> CXR: Multifocal pneumonia   Antimicrobial therapy: Rocephin: 5/25>>6/1 Doxycycline: 5/26>>531   Microbiology data: 5/26>> sputum culture: No growth 5/26>> COVID 19 PCR: Negative   Procedures : None   Consults: None     Brief Hospital Course: Acute on respiratory failure due to multifocal pneumonia: Clinically improved-down to usual 2-3 L of oxygen (home regimen).  Has completed a course of empiric Rocephin/doxycycline-do not think he requires any further antimicrobial therapy on discharge.  PCP to  ensure patient gets a two-view chest x-ray in 4 to 6 weeks.     HFpEF with exacerbation: Improved-he is now euvolemic-he has been switched to oral diabetic regimen.  Remains on Coreg/losartan.  Reassess volume status at follow-up with PCP.   COPD exacerbation: Continues to improve-likely triggered by pneumonia-no wheezing-continue tapering prednisone-continue usual bronchodilator regimen.    Chronic atrial fibrillation: Rate controlled-history of AV node ablation.  Continue Coreg and aspirin.  Not a candidate for anticoagulation due to history of ICH.   History of pacemaker implantation   HLD: Continue statin   HTN: BP stable-continue amlodipine, Coreg, losartan   DM-2 (A1c 6.0 on 3/1): CBGs stable on SSI.  Resume oral hypoglycemic agents on discharge   BPH: Continue Flomax   GERD: Continue PPI   Discharge Diagnoses:  Principal Problem:   Acute on chronic respiratory failure with hypoxia (HCC) Active Problems:   DM II (diabetes mellitus, type II), controlled (HCC)   Essential hypertension   Chronic diastolic (congestive) heart failure (HCC)   Pneumonia   Non-ischemic cardiomyopathy (HCC)   COPD GOLD 0 spirometry/ AB     03/19/2021  f/u ov/Bradley [name] re:  GOLD 0 / AB post admit for "pna" though note PCT was nl /cultures all neg  No chief complaint on file.   Dyspnea:  shopping and 100 ft to mb and sats usually 95% on 3lpm  Cough: none  Sleeping: flat bed/one pillow SABA use: has proair not using 02: 2lpm at hs and up to 3lpm POC  Covid status:   vax x 3    No obvious day to day or daytime variability or assoc excess/ purulent sputum or mucus plugs or hemoptysis or cp or chest tightness, subjective wheeze or overt sinus or hb symptoms.   sleeping without nocturnal  or early am exacerbation  of respiratory  c/o's or  need for noct saba. Also denies any obvious fluctuation of symptoms with weather or environmental changes or other aggravating or alleviating factors except as  outlined above   No unusual exposure hx or h/o childhood pna/ asthma or knowledge of premature birth.  Current Allergies, Complete Past Medical History, Past Surgical History, Family History, and Social History were reviewed in [employer] record.  ROS  The following are not active complaints unless bolded Hoarseness, sore throat, dysphagia, dental problems, itching, sneezing,  nasal congestion or discharge of excess mucus or purulent secretions, ear ache,   fever, chills, sweats, unintended wt loss or wt gain, classically pleuritic or exertional cp,  orthopnea pnd or arm/hand swelling  or leg swelling, presyncope, palpitations, abdominal pain, anorexia, nausea, vomiting, diarrhea  or change in bowel habits or change in bladder habits, change in stools or change in urine, dysuria, hematuria,  rash, arthralgias, visual complaints, headache, numbness, weakness or ataxia or problems with walking or coordination,  change in mood or  memory.        Current Meds  Medication Sig   acetaminophen (TYLENOL) 500 MG tablet Take 500 mg by mouth every 6 (six) hours as needed for headache.   albuterol (VENTOLIN HFA) 108 (90 Base) MCG/ACT inhaler Inhale 2 puffs into the lungs every 6 (six) hours as needed for wheezing or shortness of breath.   amLODipine (NORVASC) 10 MG tablet Take 1 tablet (10 mg total) by mouth daily.   aspirin 81 MG tablet Take 81 mg by mouth every morning.    atorvastatin (LIPITOR) 20 MG tablet Take 1 tablet (20 mg total) by mouth at bedtime.   budesonide (PULMICORT) 0.5 MG/2ML nebulizer solution Take 2 mLs (0.5 mg total) by nebulization 2 (two) times daily.   calcium-vitamin D (OSCAL WITH D) 500-200 MG-UNIT tablet Take 1 tablet by mouth every morning.   carvedilol (COREG) 25 MG tablet Take 1 tablet (25 mg total) by mouth 2 (two) times daily with a meal.   diphenhydramine-acetaminophen (TYLENOL PM EXTRA STRENGTH) 25-500 MG TABS tablet Take 1 tablet by mouth at bedtime.    furosemide (LASIX) 40 MG tablet TAKE 2 TABLETS DAILY EXCEPT ON MONDAY, WEDNESDAY, AND FRIDAY TAKE 2 TABLETS IN THE MORNING A 1 TABLET IN THE AFTERNOON (Patient taking differently: Take 40-80 mg by mouth See admin instructions. 80 mg Tuesday,Thursday,Saturday, Sunday. 80 mg in the morning and 40 mg in the afternoon on Monday,Wednesday, Friday)   ipratropium-albuterol (DUONEB) 0.5-2.5 (3) MG/3ML SOLN Take 3 mLs by nebulization 2 (two) times daily as needed. (Patient taking differently: Take 3 mLs by nebulization 2 (two) times daily as needed (shortness of breath).)   Lancets (ONETOUCH ULTRASOFT) lancets Check blood sugars no more than twice daily   losartan (COZAAR) 50 MG tablet Take 1 tablet (50 mg total) by mouth daily.   Multiple Vitamins-Minerals (MULTIVITAMINS THER. W/MINERALS) TABS tablet Take 1 tablet by mouth daily.   nitroGLYCERIN (NITROSTAT) 0.4 MG SL tablet Place 1 tablet (0.4 mg total) under the tongue every 5 (five) minutes x 3 doses as needed for chest pain.   ONETOUCH VERIO test strip USE TO CHECK BLOOD SUGAR NO MORE THAN TWICE A DAY   pantoprazole (PROTONIX) 40 MG tablet Take 40 mg by mouth daily.   sitaGLIPtin (JANUVIA) 100 MG tablet Take 1 tablet (100 mg total) by mouth daily.   tamsulosin (FLOMAX) 0.4 MG CAPS capsule Take 1 capsule (0.4 mg total) by mouth daily after supper.  Objective:   Physical Exam  03/19/2021          183 09/18/2020       195  08/04/20 195 lb 9.6 oz (88.7 kg)  07/28/20 205 lb 4 oz (93.1 kg)  03/25/20 208 lb 2 oz (94.4 kg)  08/27/2011     217      Vital signs reviewed  03/19/2021  - Note at rest 02 sats  95% on 3lpm poc   General appearance:    amb    Edentulous  by report     HEENT : pt wearing mask not removed for exam due to covid - 19 concerns.   NECK :  without JVD/Nodes/TM/ nl carotid upstrokes bilaterally   LUNGS: no acc muscle use,  Min barrel  contour chest wall with bilateral  slightly decreased bs s audible wheeze and   without cough on insp or exp maneuvers and min  Hyperresonant  to  percussion bilaterally     CV:  RRR  no s3 or murmur or increase in P2, and no edema   ABD:  soft and nontender with pos end  insp Hoover's  in the supine position. No bruits or organomegaly appreciated, bowel sounds nl  MS:   Nl gait/  ext warm without deformities, calf tenderness, cyanosis or clubbing No obvious joint restrictions   SKIN: warm and dry without lesions    NEURO:  alert, approp, nl sensorium with  no motor or cerebellar deficits apparent.          I personally reviewed images and   impression as follows:  CXR:   03/18/21  Cardiomegaly, improved aeration bases          Assessment & Plan:

## 2021-03-19 NOTE — Assessment & Plan Note (Signed)
TCM 14 Acute on chronic hypoxic respiratory failure: Recently admitted to the hospital with pneumonia, completed antibiotics, required high flow oxygen temporarily, back to baseline. Medications reviewed: On his usual medications plus albuterol inhaler, finished  prednisone Get  BMP, CBC. Chest x-ray in 3 weeks DM: Continue Januvia, well controlled. HTN: On amlodipine, carvedilol, losartan.  Seems controlled. COVID-vaccine: Recommend a booster. RTC already scheduled for July.

## 2021-03-20 ENCOUNTER — Encounter: Payer: Self-pay | Admitting: Internal Medicine

## 2021-03-20 NOTE — Assessment & Plan Note (Addendum)
Started on 02 2lpm at discharge 09/23/11   - PFT's 10/28/2011  FEV1  1.40 (51%)  with ratio 70 and no better p B2 and DLCO 53 corrects to 101    - PFT's 10/18/2014    FEV1 1.71 (59%) with ratio 68 and no sign change p B2 and DLCO 53 corrects to 85  -  HC03   07/28/20  = 33  -  HCO3   03/17/21     = 31   Well compensated on 2-3 lpm   Advised: Make sure you check your oxygen saturation  at your highest level of activity  to be sure it stays over 90% and adjust  02 flow upward to maintain this level if needed but remember to turn it back to previous settings when you stop (to conserve your supply).    F/u can be yearly or prn at DR Paz's request          Each maintenance medication was reviewed in detail including emphasizing most importantly the difference between maintenance and prns and under what circumstances the prns are to be triggered using an action plan format where appropriate.  Total time for H and P, chart review, counseling, reviewing neb/02 device(s) and generating customized AVS unique to this office visit / same day charting = 20 min

## 2021-03-20 NOTE — Assessment & Plan Note (Signed)
Quit smoking 1982 age 80  - PFT's 10/18/2014    FEV1 1.71 (59%) with ratio 68 and no sign change p B2 and DLCO 53% corrects to 85  - PFT's  09/18/2020  FEV1 1.41 (53 % ) ratio 0.75  p 6 % improvement from saba p 0 prior to study with DLCO  11.58 (50%) corrects to 4.71 (118%)  for alv volume and FV curve mildly concave   Back to baseline p apparent aecopd vs pna vs mild flare  Of  diastolic dysfunction > rec continue duoneb bid with budesonide as maint and prn duonebs between and f/u if needed

## 2021-03-20 NOTE — Assessment & Plan Note (Signed)
See echo 09/23/18  - Echo 09/03/20  1. Left ventricular ejection fraction, by estimation, is 55 to 60%. The left ventricle has normal function. The left ventricle has no regional wall motion abnormalities. There is mild left ventricular hypertrophy. Left ventricular diastolic parameters  are indeterminate.  2. Right ventricular systolic function is normal. The right ventricular size is moderately enlarged. There is moderately elevated pulmonary artery systolic pressure.  3. Left atrial size was mildly dilated.  4. Right atrial size was severely dilated.  5. The mitral valve is normal in structure. Trivial mitral valve regurgitation.  6. Tricuspid valve regurgitation is moderate.  7. The aortic valve is tricuspid. Aortic valve regurgitation is not visualized. Mild to moderate aortic valve sclerosis/calcification is present, without any evidence of aortic stenosis.  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

## 2021-03-24 ENCOUNTER — Ambulatory Visit: Payer: Medicare Other | Admitting: *Deleted

## 2021-04-02 ENCOUNTER — Ambulatory Visit: Payer: Medicare Other

## 2021-04-02 ENCOUNTER — Other Ambulatory Visit: Payer: Self-pay

## 2021-04-02 ENCOUNTER — Ambulatory Visit (INDEPENDENT_AMBULATORY_CARE_PROVIDER_SITE_OTHER): Payer: Medicare Other

## 2021-04-02 VITALS — BP 130/90 | HR 78 | Temp 97.2°F | Resp 16 | Ht 69.0 in | Wt 190.0 lb

## 2021-04-02 DIAGNOSIS — Z0000 Encounter for general adult medical examination without abnormal findings: Secondary | ICD-10-CM | POA: Diagnosis not present

## 2021-04-02 NOTE — Progress Notes (Signed)
Subjective:   Dierdre SearlesJohn J Near Sr. is a 80 y.o. male who presents for Medicare Annual/Subsequent preventive examination.  Review of Systems     Cardiac Risk Factors include: male gender;advanced age (>3155men, 41>65 women);diabetes mellitus;dyslipidemia;hypertension     Objective:    Today's Vitals   04/02/21 1337  BP: 130/90  Pulse: 78  Resp: 16  Temp: (!) 97.2 F (36.2 C)  TempSrc: Temporal  SpO2: 91%  Weight: 190 lb (86.2 kg)  Height: 5\' 9"  (1.753 m)   Body mass index is 28.06 kg/m.  Advanced Directives 04/02/2021 03/06/2021 03/05/2021 03/21/2020 09/14/2019 05/07/2019 05/07/2019  Does Patient Have a Medical Advance Directive? Yes - No Yes Yes Yes Yes  Type of Estate agentAdvance Directive Healthcare Power of [place];Living will - - Healthcare Power of [place];Living will Living will Living will -  Does patient want to make changes to medical advance directive? - - - No - Patient declined No - Patient declined No - Patient declined -  Copy of Healthcare Power of Attorney in Chart? No - copy requested - - No - copy requested - - -  Would patient like information on creating a medical advance directive? - No - Patient declined - - - - -  Pre-existing out of facility DNR order (yellow form or pink MOST form) - - - - - - -    Current Medications (verified) Outpatient Encounter Medications as of 04/02/2021  Medication Sig   acetaminophen (TYLENOL) 500 MG tablet Take 500 mg by mouth every 6 (six) hours as needed for headache.   albuterol (VENTOLIN HFA) 108 (90 Base) MCG/ACT inhaler Inhale 2 puffs into the lungs every 6 (six) hours as needed for wheezing or shortness of breath.   amLODipine (NORVASC) 10 MG tablet Take 1 tablet (10 mg total) by mouth daily.   aspirin 81 MG tablet Take 81 mg by mouth every morning.    atorvastatin (LIPITOR) 20 MG tablet Take 1 tablet (20 mg total) by mouth at bedtime.   budesonide (PULMICORT) 0.5 MG/2ML nebulizer solution Take 2 mLs (0.5 mg total) by nebulization 2  (two) times daily.   calcium-vitamin D (OSCAL WITH D) 500-200 MG-UNIT tablet Take 1 tablet by mouth every morning.   carvedilol (COREG) 25 MG tablet Take 1 tablet (25 mg total) by mouth 2 (two) times daily with a meal.   diphenhydramine-acetaminophen (TYLENOL PM EXTRA STRENGTH) 25-500 MG TABS tablet Take 1 tablet by mouth at bedtime.   furosemide (LASIX) 40 MG tablet TAKE 2 TABLETS DAILY EXCEPT ON MONDAY, WEDNESDAY, AND FRIDAY TAKE 2 TABLETS IN THE MORNING A 1 TABLET IN THE AFTERNOON (Patient taking differently: Take 40-80 mg by mouth See admin instructions. 80 mg Tuesday,Thursday,Saturday, Sunday. 80 mg in the morning and 40 mg in the afternoon on Monday,Wednesday, Friday)   ipratropium-albuterol (DUONEB) 0.5-2.5 (3) MG/3ML SOLN Take 3 mLs by nebulization 2 (two) times daily as needed. (Patient taking differently: Take 3 mLs by nebulization 2 (two) times daily as needed (shortness of breath).)   Lancets (ONETOUCH ULTRASOFT) lancets Check blood sugars no more than twice daily   losartan (COZAAR) 50 MG tablet Take 1 tablet (50 mg total) by mouth daily.   Multiple Vitamins-Minerals (MULTIVITAMINS THER. W/MINERALS) TABS tablet Take 1 tablet by mouth daily.   nitroGLYCERIN (NITROSTAT) 0.4 MG SL tablet Place 1 tablet (0.4 mg total) under the tongue every 5 (five) minutes x 3 doses as needed for chest pain.   ONETOUCH VERIO test strip USE TO CHECK BLOOD SUGAR NO  MORE THAN TWICE A DAY   pantoprazole (PROTONIX) 40 MG tablet Take 40 mg by mouth daily.   sitaGLIPtin (JANUVIA) 100 MG tablet Take 1 tablet (100 mg total) by mouth daily.   tamsulosin (FLOMAX) 0.4 MG CAPS capsule Take 1 capsule (0.4 mg total) by mouth daily after supper.   [DISCONTINUED] COVID-19 mRNA Vac-TriS, Pfizer, SUSP injection Inject into the muscle.   No facility-administered encounter medications on file as of 04/02/2021.    Allergies (verified) Hydrocodone, Tramadol, Tadalafil, and Avelox [moxifloxacin hydrochloride]   History: Past  Medical History:  Diagnosis Date   Abnormal CT scan, chest 2012   CT chest, several lymphadenopathies. Sees pulmonary   Anemia    intermittent   Arthritis    "?back" (09/19/2018)   Atrial fibrillation (HCC)    s/p AV node ablation & BiV PPM implantation 09/08/11 (op dictation pending)   BPH (benign prostatic hyperplasia)    Saw Dr [name] 2004, normal renal u/s   Bronchitis 08/26/2017   CAD (coronary artery disease)    CHF (congestive heart failure) (HCC)    Thought primarily to be non-systolic although EF down (EF 03-50% 12/2010, down to 35-40% 09/05/11), cath 2008 with no CAD, nuclear study 07/2011 showing Small area of reversibility in the distal ant/lat wall the left ventricle suspicious for ischemia/septal wall HK but felt to be low risk  (per D/C Summary 07/2011)   Chronic bronchitis (HCC)    "get it ~ q yr"   Heart murmur    HLD (hyperlipidemia)    HTN (hypertension)    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   Pleural effusion 2008   S/p decortication   Pulmonary HTN (HCC)    per cath 2008   Type II diabetes mellitus (HCC) 1999   Past Surgical History:  Procedure Laterality Date   BI-VENTRICULAR PACEMAKER INSERTION Left 09/08/2011   Procedure: BI-VENTRICULAR PACEMAKER INSERTION (CRT-P);  Surgeon: Marinus Maw, MD;  Location: [facility] C[facility] LAB;  Service: Cardiovascular;  Laterality: Left;   CARDIAC CATHETERIZATION     "couple times; never had balloon or stent" (09/19/2018)   CATARACT EXTRACTION W/ INTRAOCULAR LENS  IMPLANT, BILATERAL Bilateral 08/2018   COLONOSCOPY  03/10/11   normal   INSERT / REPLACE / REMOVE PACEMAKER  09/08/11   pacemaker placement   LUNG DECORTICATION     PLEURAL SCARIFICATION     pneumothorax with fibrothorax  ~ 2010   TONSILLECTOMY     "as a kid"    Family  History  Problem Relation Age of Onset   Diabetes Father    Coronary artery disease Father    Polycythemia Mother    Drug abuse Son    Breast cancer Maternal Aunt    Prostate cancer Neg Hx    Colon cancer Neg Hx    Social History   Socioeconomic History   Marital status: Married    Spouse name: [place]   Number of children: 1   Years of education: Not on file   Highest education level: Not on file  Occupational History   Occupation: retired Publishing rights manager: RETIRED  Tobacco Use   Smoking status: Former    Packs/day: 2.50    Years: 25.00    Pack years:  62.50    Types: Cigarettes, Cigars    Quit date: 10/11/1980    Years since quitting: 40.5   Smokeless tobacco: Never  Vaping Use   Vaping Use: Never used  Substance and Sexual Activity   Alcohol use: Not Currently    Comment: 1 can of beer ever 4-5 months   Drug use: No   Sexual activity: Not Currently  Other Topics Concern   Not on file  Social History Narrative   Moved from [place]   He lives [place]/ his wife, [name]   1 son, substance abuse, passed away 09-22-16         Social Determinants of Health   Financial Resource Strain: Low Risk    Difficulty of Paying Living Expenses: Not hard at all  Food Insecurity: No Food Insecurity   Worried About Programme researcher, broadcasting/film/video in the Last Year: Never true   Barista in the Last Year: Never true  Transportation Needs: No Transportation Needs   Lack of Transportation (Medical): No   Lack of Transportation (Non-Medical): No  Physical Activity: Inactive   Days of Exercise per Week: 0 days   Minutes of Exercise per Session: 0 min  Stress: No Stress Concern Present   Feeling of Stress : Not at all  Social Connections: Moderately Integrated   Frequency of Communication with Friends and Family: More than three times a week   Frequency of Social Gatherings with Friends and Family: More than three times a week   Attends Religious Services: More than 4  times per year   Active Member of [employer] or Organizations: No   Attends Engineer, structural: Never   Marital Status: Married    Tobacco Counseling Counseling given: Not Answered   Clinical Intake:  Pre-visit preparation completed: Yes  Pain : No/denies pain     Nutritional Status: BMI 25 -29 Overweight Nutritional Risks: None Diabetes: Yes CBG done?: No Did pt. bring in CBG monitor from home?: No  How often do you need to have someone help you when you read instructions, pamphlets, or other written materials from your doctor or pharmacy?: 1 - Never  Diabetes:  Is the patient diabetic?  Yes  If diabetic, was a CBG obtained today?  No  Did the patient bring in their glucometer from home?  No  How often do you monitor your CBG's? occasionally.   Financial Strains and Diabetes Management:  Are you having any financial strains with the device, your supplies or your medication? No .  Does the patient want to be seen by Chronic Care Management for management of their diabetes?  No  Would the patient like to be referred to a Nutritionist or for Diabetic Management?  No   Diabetic Exams:  Diabetic Eye Exam: Completed 05/06/2020.   Diabetic Foot Exam: Completed 12/09/2020.    Interpreter Needed?: No  Information entered by :: Thomasenia Sales LPN   Activities of Daily Living In your present state of health, do you have any difficulty performing the following activities: 04/02/2021 03/06/2021  Hearing? N N  Vision? N N  Difficulty concentrating or making decisions? N N  Walking or climbing stairs? N N  Dressing or bathing? N N  Doing errands, shopping? N Y  Quarry manager and eating ? N -  Using the Toilet? N -  In the past six months, have you accidently leaked urine? N -  Do you have problems with loss of bowel control? N -  Managing your Medications? N -  Managing your Finances? N -  Housekeeping or managing your Housekeeping? N -  Some recent data might be  hidden    Patient Care Team: [name], MD as PCP - General (Internal Medicine) [name], MD as PCP - Cardiology (Cardiology) Marinus Maw, MD as Consulting Physician (Cardiology) Nyoka Cowden, MD as Consulting Physician (Pulmonary Disease) Melvenia Beam, MD as Consulting Physician (Otolaryngology) [place], Dede Query, OD (Optometry) Erroll Luna, [facility] (Pharmacist)  Indicate any recent Medical Services you may have received from other than [facility] providers in the past year (date may be approximate).     Assessment:   This is a routine wellness examination for Joeanthony.  Hearing/Vision screen Hearing Screening - Comments:: Some hearing loss-no hearing aids Vision Screening - Comments:: Wears glasses Last eye exam-04/2020- [facility]  Dietary issues and exercise activities discussed: Current Exercise Habits: The patient does not participate in regular exercise at present, Exercise limited by: None identified   Goals Addressed               This Visit's Progress     Patient Stated     Maintain current health (pt-stated)   On track      Depression Screen PHQ 2/9 Scores 04/02/2021 12/09/2020 03/21/2020 11/26/2019 09/11/2018 05/25/2018 09/07/2017  PHQ - 2 Score 0 0 0 0 0 0 0    Fall Risk Fall Risk  04/02/2021 12/09/2020 03/21/2020 11/26/2019 09/11/2018  Falls in the past year? 0 0 0 0 0  Number falls in past yr: 0 0 0 0 -  Injury with Fall? 0 0 0 0 -  Follow up Falls prevention discussed - Education provided;Falls prevention discussed Falls evaluation completed -    FALL RISK PREVENTION PERTAINING TO THE HOME:  Any stairs in or around the home? Yes  If so, are there any without handrails? No  Home free of loose throw rugs in walkways, pet beds, electrical cords, etc? Yes  Adequate lighting in your home to reduce risk of falls? Yes   ASSISTIVE DEVICES UTILIZED TO PREVENT FALLS:  Life alert? No  Use of a cane, walker or w/c? No  Grab bars in the bathroom?  Yes  Shower chair or bench in shower? No  Elevated toilet seat or a handicapped toilet? No   TIMED UP AND GO:  Was the test performed? Yes .  Length of time to ambulate 10 feet: 11 sec.   Gait slow and steady without use of assistive device  Cognitive Function:Normal cognitive status assessed by direct observation by this Nurse Health Advisor. No abnormalities found.   MMSE - Mini Mental State Exam 09/07/2017  Not completed: Refused        Immunizations Immunization History  Administered Date(s) Administered   Fluad Quad(high Dose 65+) 06/26/2019, 08/11/2020   Influenza Split 10/01/2011   Influenza Whole 08/14/2007, 07/11/2009, 09/14/2010   Influenza, High Dose Seasonal PF 09/14/2013, 07/15/2015, 07/09/2016, 07/05/2017, 08/03/2018   Influenza,inj,Quad PF,6+ Mos 07/05/2014   Influenza-Unspecified 08/11/2012   Janssen (J&J) SARS-COV-2 Vaccination 01/12/2020, 09/19/2020   PFIZER Comirnaty(Gray Top)Covid-19 Tri-Sucrose Vaccine 03/18/2021   Pneumococcal Conjugate-13 03/12/2015   Pneumococcal Polysaccharide-23 07/11/2004, 03/30/2009, 11/01/2017, 05/11/2019   Td 03/12/2003   Tetanus 09/14/2013   Zoster, Live 04/20/2011    TDAP status: Up to date  Flu Vaccine status: Up to date  Pneumococcal vaccine status: Up to date  Covid-19 vaccine status: Completed vaccines  Qualifies for Shingles Vaccine? Yes   Zostavax completed Yes  Shingrix Completed?: No.    Education has been provided regarding the importance of this vaccine. Patient has been advised to call insurance company to determine out of pocket expense if they have not yet received this vaccine. Advised may also receive vaccine at local pharmacy or Health Dept. Verbalized acceptance and understanding.  Screening Tests Health Maintenance  Topic Date Due   Zoster Vaccines- Shingrix (1 of 2) Never done   OPHTHALMOLOGY EXAM  05/06/2021   INFLUENZA VACCINE  05/11/2021   HEMOGLOBIN A1C  09/05/2021   FOOT EXAM  12/09/2021    TETANUS/TDAP  09/15/2023   COVID-19 Vaccine  Completed   PNA vac Low Risk Adult  Completed   HPV VACCINES  Aged Out    Health Maintenance  Health Maintenance Due  Topic Date Due   Zoster Vaccines- Shingrix (1 of 2) Never done    Colorectal cancer screening: No longer required.   Lung Cancer Screening: (Low Dose CT Chest recommended if Age 46-80 years, 30 pack-year currently smoking OR have quit w/in 15years.) does not qualify.     Additional Screening:  Hepatitis C Screening: does not qualify  Vision Screening: Recommended annual ophthalmology exams for early detection of glaucoma and other disorders of the eye. Is the patient up to date with their annual eye exam?  Yes  Who is the provider or what is the name of the office in which the patient attends annual eye exams? [facility]  Dental Screening: Recommended annual dental exams for proper oral hygiene  Community Resource Referral / Chronic Care Management: CRR required this visit?  No   CCM required this visit?  No      Plan:     I have personally reviewed and noted the following in the patient's chart:   Medical and social history Use of alcohol, tobacco or illicit drugs  Current medications and supplements including opioid prescriptions. Patient is not currently taking opioid prescriptions. Functional ability and status Nutritional status Physical activity Advanced directives List of other physicians Hospitalizations, surgeries, and ER visits in previous 12 months Vitals Screenings to include cognitive, depression, and falls Referrals and appointments  In addition, I have reviewed and discussed with patient certain preventive protocols, quality metrics, and best practice recommendations. A written personalized care plan for preventive services as well as general preventive health recommendations were provided to patient.   Patient would like to access avs on mychart  Roanna Raider,  [place]   [id number]  Nurse Health Advisor  Nurse Notes: None

## 2021-04-02 NOTE — Patient Instructions (Signed)
Bradley [name] , Thank you for taking time to come for your Medicare Wellness Visit. I appreciate your ongoing commitment to your health goals. Please review the following plan we discussed and let me know if I can assist you in the future.   Screening recommendations/referrals: Colonoscopy: No longer required Recommended yearly ophthalmology/optometry visit for glaucoma screening and checkup Recommended yearly dental visit for hygiene and checkup  Vaccinations: Influenza vaccine: Up to date Pneumococcal vaccine: Up to date Tdap vaccine: Up to date-Due-09/15/2023 Shingles vaccine: Discuss with pharmacy   Covid-19: Up to date  Advanced directives: Please bring a copy for your chart  Conditions/risks identified: See problem list  Next appointment: Follow up in one year for your annual wellness visit.   Preventive Care 80 Years and Older, Male Preventive care refers to lifestyle choices and visits with your health care provider that can promote health and wellness. What does preventive care include? A yearly physical exam. This is also called an annual well check. Dental exams once or twice a year. Routine eye exams. Ask your health care provider how often you should have your eyes checked. Personal lifestyle choices, including: Daily care of your teeth and gums. Regular physical activity. Eating a healthy diet. Avoiding tobacco and drug use. Limiting alcohol use. Practicing safe sex. Taking low doses of aspirin every day. Taking vitamin and mineral supplements as recommended by your health care provider. What happens during an annual well check? The services and screenings done by your health care provider during your annual well check will depend on your age, overall health, lifestyle risk factors, and family history of disease. Counseling  Your health care provider may ask you questions about your: Alcohol use. Tobacco use. Drug use. Emotional well-being. Home and relationship  well-being. Sexual activity. Eating habits. History of falls. Memory and ability to understand (cognition). Work and work Astronomer. Screening  You may have the following tests or measurements: Height, weight, and BMI. Blood pressure. Lipid and cholesterol levels. These may be checked every 5 years, or more frequently if you are over 36 years old. Skin check. Lung cancer screening. You may have this screening every year starting at age 80 if you have a 30-pack-year history of smoking and currently smoke or have quit within the past 15 years. Fecal occult blood test (FOBT) of the stool. You may have this test every year starting at age 80. Flexible sigmoidoscopy or colonoscopy. You may have a sigmoidoscopy every 5 years or a colonoscopy every 10 years starting at age 55. Prostate cancer screening. Recommendations will vary depending on your family history and other risks. Hepatitis C blood test. Hepatitis B blood test. Sexually transmitted disease (STD) testing. Diabetes screening. This is done by checking your blood sugar (glucose) after you have not eaten for a while (fasting). You may have this done every 1-3 years. Abdominal aortic aneurysm (AAA) screening. You may need this if you are a current or former smoker. Osteoporosis. You may be screened starting at age 2 if you are at high risk. Talk with your health care provider about your test results, treatment options, and if necessary, the need for more tests. Vaccines  Your health care provider may recommend certain vaccines, such as: Influenza vaccine. This is recommended every year. Tetanus, diphtheria, and acellular pertussis (Tdap, Td) vaccine. You may need a Td booster every 10 years. Zoster vaccine. You may need this after age 80. Pneumococcal 13-valent conjugate (PCV13) vaccine. One dose is recommended after age 80. Pneumococcal polysaccharide (PPSV23)  vaccine. One dose is recommended after age 57. Talk to your health care  provider about which screenings and vaccines you need and how often you need them. This information is not intended to replace advice given to you by your health care provider. Make sure you discuss any questions you have with your health care provider. Document Released: 10/24/2015 Document Revised: 06/16/2016 Document Reviewed: 07/29/2015 Elsevier Interactive Patient Education  2017 [place] Prevention in the Home Falls can cause injuries. They can happen to people of all ages. There are many things you can do to make your home safe and to help prevent falls. What can I do on the outside of my home? Regularly fix the edges of walkways and driveways and fix any cracks. Remove anything that might make you trip as you walk through a door, such as a raised step or threshold. Trim any bushes or trees on the path to your home. Use bright outdoor lighting. Clear any walking paths of anything that might make someone trip, such as rocks or tools. Regularly check to see if handrails are loose or broken. Make sure that both sides of any steps have handrails. Any raised decks and porches should have guardrails on the edges. Have any leaves, snow, or ice cleared regularly. Use sand or salt on walking paths during winter. Clean up any spills in your garage right away. This includes oil or grease spills. What can I do in the bathroom? Use night lights. Install grab bars by the toilet and in the tub and shower. Do not use towel bars as grab bars. Use non-skid mats or decals in the tub or shower. If you need to sit down in the shower, use a plastic, non-slip stool. Keep the floor dry. Clean up any water that spills on the floor as soon as it happens. Remove soap buildup in the tub or shower regularly. Attach bath mats securely with double-sided non-slip rug tape. Do not have throw rugs and other things on the floor that can make you trip. What can I do in the bedroom? Use night lights. Make  sure that you have a light by your bed that is easy to reach. Do not use any sheets or blankets that are too big for your bed. They should not hang down onto the floor. Have a firm chair that has side arms. You can use this for support while you get dressed. Do not have throw rugs and other things on the floor that can make you trip. What can I do in the kitchen? Clean up any spills right away. Avoid walking on wet floors. Keep items that you use a lot in easy-to-reach places. If you need to reach something above you, use a strong step stool that has a grab bar. Keep electrical cords out of the way. Do not use floor polish or wax that makes floors slippery. If you must use wax, use non-skid floor wax. Do not have throw rugs and other things on the floor that can make you trip. What can I do with my stairs? Do not leave any items on the stairs. Make sure that there are handrails on both sides of the stairs and use them. Fix handrails that are broken or loose. Make sure that handrails are as long as the stairways. Check any carpeting to make sure that it is firmly attached to the stairs. Fix any carpet that is loose or worn. Avoid having throw rugs at the top or bottom  of the stairs. If you do have throw rugs, attach them to the floor with carpet tape. Make sure that you have a light switch at the top of the stairs and the bottom of the stairs. If you do not have them, ask someone to add them for you. What else can I do to help prevent falls? Wear shoes that: Do not have high heels. Have rubber bottoms. Are comfortable and fit you well. Are closed at the toe. Do not wear sandals. If you use a stepladder: Make sure that it is fully opened. Do not climb a closed stepladder. Make sure that both sides of the stepladder are locked into place. Ask someone to hold it for you, if possible. Clearly mark and make sure that you can see: Any grab bars or handrails. First and last steps. Where the  edge of each step is. Use tools that help you move around (mobility aids) if they are needed. These include: Canes. Walkers. Scooters. Crutches. Turn on the lights when you go into a dark area. Replace any light bulbs as soon as they burn out. Set up your furniture so you have a clear path. Avoid moving your furniture around. If any of your floors are uneven, fix them. If there are any pets around you, be aware of where they are. Review your medicines with your doctor. Some medicines can make you feel dizzy. This can increase your chance of falling. Ask your doctor what other things that you can do to help prevent falls. This information is not intended to replace advice given to you by your health care provider. Make sure you discuss any questions you have with your health care provider. Document Released: 07/24/2009 Document Revised: 03/04/2016 Document Reviewed: 11/01/2014 Elsevier Interactive Patient Education  2017 [employer].

## 2021-04-10 ENCOUNTER — Other Ambulatory Visit: Payer: Self-pay

## 2021-04-10 ENCOUNTER — Ambulatory Visit (INDEPENDENT_AMBULATORY_CARE_PROVIDER_SITE_OTHER): Payer: Medicare Other | Admitting: Internal Medicine

## 2021-04-10 ENCOUNTER — Encounter: Payer: Self-pay | Admitting: Internal Medicine

## 2021-04-10 VITALS — BP 122/68 | HR 71 | Temp 97.8°F | Resp 20 | Ht 69.0 in | Wt 187.2 lb

## 2021-04-10 DIAGNOSIS — I10 Essential (primary) hypertension: Secondary | ICD-10-CM

## 2021-04-10 DIAGNOSIS — N400 Enlarged prostate without lower urinary tract symptoms: Secondary | ICD-10-CM

## 2021-04-10 DIAGNOSIS — J449 Chronic obstructive pulmonary disease, unspecified: Secondary | ICD-10-CM

## 2021-04-10 DIAGNOSIS — E118 Type 2 diabetes mellitus with unspecified complications: Secondary | ICD-10-CM

## 2021-04-10 NOTE — Patient Instructions (Signed)
    GO TO THE FRONT DESK, PLEASE SCHEDULE YOUR APPOINTMENTS Come back for a check up in 4 months  

## 2021-04-10 NOTE — Progress Notes (Signed)
Subjective:    Patient ID: Bradley Sit., male    DOB: May 19, 1941, 80 y.o.   MRN: [id number]  DOS:  04/10/2021 Type of visit - description: ROV  Since the last office visit he is doing well. We reviewed together his previous labs. We also reviewed his medication list.  Review of Systems Breathing at baseline.  No chest pain No cough No nausea or vomiting No dysuria or gross hematuria  Past Medical History:  Diagnosis Date   Abnormal CT scan, chest 2012   CT chest, several lymphadenopathies. Sees pulmonary   Anemia    intermittent   Arthritis    "?back" (09/19/2018)   Atrial fibrillation (HCC)    s/p AV node ablation & BiV PPM implantation 09/08/11 (op dictation pending)   BPH (benign prostatic hyperplasia)    Saw Dr [name] 2004, normal renal u/s   Bronchitis 08/26/2017   CAD (coronary artery disease)    CHF (congestive heart failure) (HCC)    Thought primarily to be non-systolic although EF down (EF 76-16% 12/2010, down to 35-40% 09/05/11), cath 2008 with no CAD, nuclear study 07/2011 showing Small area of reversibility in the distal ant/lat wall the left ventricle suspicious for ischemia/septal wall HK but felt to be low risk  (per D/C Summary 07/2011)   Chronic bronchitis (HCC)    "get it ~ q yr"   Heart murmur    HLD (hyperlipidemia)    HTN (hypertension)    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   Pleural effusion 2008   S/p decortication   Pulmonary HTN (HCC)    per cath 2008   Type II diabetes mellitus (HCC) 1999    Past Surgical History:  Procedure Laterality Date   BI-VENTRICULAR PACEMAKER INSERTION Left 09/08/2011   Procedure: BI-VENTRICULAR PACEMAKER INSERTION (CRT-P);  Surgeon: Marinus Maw, MD;  Location: [facility] CATH LAB;  Service: Cardiovascular;  Laterality: Left;    CARDIAC CATHETERIZATION     "couple times; never had balloon or stent" (09/19/2018)   CATARACT EXTRACTION W/ INTRAOCULAR LENS  IMPLANT, BILATERAL Bilateral 08/2018   COLONOSCOPY  03/10/11   normal   INSERT / REPLACE / REMOVE PACEMAKER  09/08/11   pacemaker placement   LUNG DECORTICATION     PLEURAL SCARIFICATION     pneumothorax with fibrothorax  ~ 2010   TONSILLECTOMY     "as a kid"     Allergies as of 04/10/2021       Reactions   Hydrocodone Other (See Comments)   "given to him in the hospital; went thru withdrawals once home; dr said not to take it again" (09/27/2012)   Tramadol Other (See Comments)   "given to him in the hospital; went thru withdrawals once home; dr said not to take it again" (09/27/2012)   Tadalafil Other (See Comments)   headache, backache   Avelox [moxifloxacin Hydrochloride] Itching, Other (See Comments)   Headache        Medication List        Accurate as of April 10, 2021 11:59 PM. If you have any questions, ask your nurse or doctor.          STOP taking these medications    predniSONE 10 MG tablet Commonly known as:  DELTASONE Stopped by: [name], MD       TAKE these medications    acetaminophen 500 MG tablet Commonly known as: TYLENOL Take 500 mg by mouth every 6 (six) hours as needed for headache.   albuterol 108 (90 Base) MCG/ACT inhaler Commonly known as: VENTOLIN HFA Inhale 2 puffs into the lungs every 6 (six) hours as needed for wheezing or shortness of breath.   amLODipine 10 MG tablet Commonly known as: NORVASC Take 1 tablet (10 mg total) by mouth daily.   aspirin 81 MG tablet Take 81 mg by mouth every morning.   atorvastatin 20 MG tablet Commonly known as: LIPITOR Take 1 tablet (20 mg total) by mouth at bedtime.   budesonide 0.5 MG/2ML nebulizer solution Commonly known as: PULMICORT Take 2 mLs (0.5 mg total) by nebulization 2 (two) times daily.   calcium-vitamin D 500-200 MG-UNIT tablet Commonly known as: OSCAL  WITH D Take 1 tablet by mouth every morning.   carvedilol 25 MG tablet Commonly known as: COREG Take 1 tablet (25 mg total) by mouth 2 (two) times daily with a meal.   furosemide 40 MG tablet Commonly known as: LASIX TAKE 2 TABLETS DAILY EXCEPT ON MONDAY, WEDNESDAY, AND FRIDAY TAKE 2 TABLETS IN THE MORNING A 1 TABLET IN THE AFTERNOON What changed:  how much to take how to take this when to take this additional instructions   ipratropium-albuterol 0.5-2.5 (3) MG/3ML Soln Commonly known as: DUONEB Take 3 mLs by nebulization 2 (two) times daily as needed. What changed: reasons to take this   losartan 50 MG tablet Commonly known as: COZAAR Take 1 tablet (50 mg total) by mouth daily.   multivitamins ther. w/minerals Tabs tablet Take 1 tablet by mouth daily.   nitroGLYCERIN 0.4 MG SL tablet Commonly known as: NITROSTAT Place 1 tablet (0.4 mg total) under the tongue every 5 (five) minutes x 3 doses as needed for chest pain.   onetouch ultrasoft lancets Check blood sugars no more than twice daily   OneTouch Verio test strip Generic drug: glucose blood USE TO CHECK BLOOD SUGAR NO MORE THAN TWICE A DAY   pantoprazole 40 MG tablet Commonly known as: PROTONIX Take 40 mg by mouth daily.   sitaGLIPtin 100 MG tablet Commonly known as: Januvia Take 1 tablet (100 mg total) by mouth daily.   tamsulosin 0.4 MG Caps capsule Commonly known as: FLOMAX Take 1 capsule (0.4 mg total) by mouth daily after supper.   Tylenol PM Extra Strength 25-500 MG Tabs tablet Generic drug: diphenhydramine-acetaminophen Take 1 tablet by mouth at bedtime.           Objective:   Physical Exam BP 122/68 (BP Location: Left Arm, Patient Position: Sitting, Cuff Size: Normal)   Pulse 71   Temp 97.8 F (36.6 C) (Oral)   Resp 20   Ht 5\' 9"  (1.753 m)   Wt 187 lb 4 oz (84.9 kg)   SpO2 92%   BMI 27.65 kg/m  General:   Well developed, NAD, BMI noted. HEENT:  Normocephalic . Face symmetric,  atraumatic Lungs:  has a portable oxygen tank Normal respiratory effort, no intercostal retractions, no accessory muscle use. Heart: RRR,  no murmur.  Lower extremities: no pretibial edema bilaterally  Skin: Not pale. Not jaundice Neurologic:  alert & oriented X3.  Speech normal, gait appropriate for age and unassisted Psych--  Cognition and judgment appear intact.  Cooperative with normal attention span and concentration.  Behavior appropriate. No anxious or depressed  appearing.      Assessment     Assessment  DM HTN Hyperlipidemia GERD Anxiety- insomnia  BPH Cardiovascular:Dr Ladona Ridgel  --  Cath x 2 [id number]: no significant CAD -- CHF, non-ischemic  --Atrial fibrillation; h/o AV node ablation, has a Pacemaker --h/o IC bleeding: Not anticoagulated --Peripheral vascular disease? Pulmonary: Dr [name], last OV 10-18-2014, f/u prn --COPD, severe, chronic resp failure, night O2 prn, has portable O2  --Pleural effusion s/p decortication --Pulmonary hypertension  Stasis dermatitis   PLAN: DM: Well-controlled on Januvia. HTN: Well-controlled, continue amlodipine, losartan carvedilol, Lasix.  Last BMP satisfactory BPH: Asymptomatic, on Flomax COPD: Good compliance to medications, prednisone was on his med list but he is not taking it.  Currently doing well. Preventive care reviewed today RTC 4 months  Time spent 15 min  This visit occurred during the SARS-CoV-2 public health emergency.  Safety protocols were in place, including screening questions prior to the visit, additional usage of staff PPE, and extensive cleaning of exam room while observing appropriate contact time as indicated for disinfecting solutions.

## 2021-04-11 ENCOUNTER — Encounter: Payer: Self-pay | Admitting: Internal Medicine

## 2021-04-11 NOTE — Assessment & Plan Note (Signed)
Tdap 2014 PNM 23: Last 04-2019 PNM 13: 2016 Zostavax 2012 Shingrix: Recommended  Status post J&J Covid vax x 2, pfizer 03/2021 See previous notes, no further screenings recommended.

## 2021-04-11 NOTE — Assessment & Plan Note (Signed)
DM: Well-controlled on Januvia. HTN: Well-controlled, continue amlodipine, losartan carvedilol, Lasix.  Last BMP satisfactory BPH: Asymptomatic, on Flomax COPD: Good compliance to medications, prednisone was on his med list but he is not taking it.  Currently doing well. Preventive care reviewed today RTC 4 months

## 2021-04-22 ENCOUNTER — Ambulatory Visit (INDEPENDENT_AMBULATORY_CARE_PROVIDER_SITE_OTHER): Payer: Medicare Other | Admitting: Pharmacist

## 2021-04-22 ENCOUNTER — Telehealth: Payer: Self-pay | Admitting: Pharmacist

## 2021-04-22 DIAGNOSIS — I10 Essential (primary) hypertension: Secondary | ICD-10-CM

## 2021-04-22 DIAGNOSIS — I5032 Chronic diastolic (congestive) heart failure: Secondary | ICD-10-CM | POA: Diagnosis not present

## 2021-04-22 DIAGNOSIS — J9612 Chronic respiratory failure with hypercapnia: Secondary | ICD-10-CM

## 2021-04-22 DIAGNOSIS — J9611 Chronic respiratory failure with hypoxia: Secondary | ICD-10-CM

## 2021-04-22 DIAGNOSIS — J440 Chronic obstructive pulmonary disease with acute lower respiratory infection: Secondary | ICD-10-CM | POA: Diagnosis not present

## 2021-04-22 DIAGNOSIS — E118 Type 2 diabetes mellitus with unspecified complications: Secondary | ICD-10-CM | POA: Diagnosis not present

## 2021-04-22 MED ORDER — LOSARTAN POTASSIUM 50 MG PO TABS: 50.0000 mg | ORAL_TABLET | Freq: Every day | ORAL | 1 refills | Status: DC

## 2021-04-22 NOTE — Telephone Encounter (Signed)
Updated Rx needed for losartan 50mg  - last filled #90 12/08/20 - uses 12/10/20

## 2021-04-22 NOTE — Telephone Encounter (Signed)
Rx sent 

## 2021-04-22 NOTE — Chronic Care Management (AMB) (Signed)
Chronic Care Management Pharmacy Note  04/22/2021 Name:  Bradley STACKS Sr. MRN:  [id number] DOB:  Feb 01, 1941  Subjective: Bradley Jock Sr. is an 80 y.o. year old male who is a primary patient of Paz, Alda Berthold, MD.  The CCM team was consulted for assistance with disease management and care coordination needs.    Engaged with patient by telephone for follow up visit in response to provider referral for pharmacy case management and/or care coordination services.   Consent to Services:  The patient was given information about Chronic Care Management services, agreed to services, and gave verbal consent prior to initiation of services.  Please see initial visit note for detailed documentation.   Patient Care Team: Colon Branch, MD as PCP - General (Internal Medicine) Skeet Latch, MD as PCP - Cardiology (Cardiology) Evans Lance, MD as Consulting Physician (Cardiology) Tanda Rockers, MD as Consulting Physician (Pulmonary Disease) Ruby Cola, MD as Consulting Physician (Otolaryngology) Joseph Art, OD (Optometry) Cherre Robins, PharmD (Pharmacist)  Recent office visits: 04/10/2021 - PCP (Dr Larose Kells) recheck type 2 DM and COPD. No med changes. Noted patient has completed prednisone prescribed at discharge 03/17/2021 - PCP (DR [facility]) hospital follow up. Recommended COVID booster. No med changes.   Recent consult visits: 03/19/2021 - Pulmonology (Dr Melvyn Novas)  F/U COPD. No med changes.  02/17/2021 - Cardio (Dr Lovena Le) F/U a fib and LV dysfunction. H/O ablation. CHF - class 2. No med changes.   Hospital visits: 03/06/2021 to 03/11/2021 [facility] admission for COPD exacerbation. Prescribed prednisone 5m x 3 days.   Objective:  Lab Results  Component Value Date   CREATININE 0.87 03/17/2021   CREATININE 0.77 03/10/2021   CREATININE 0.80 03/09/2021    Lab Results  Component Value Date   HGBA1C 5.9 (H) 03/05/2021   Last diabetic Eye exam:  Lab Results  Component Value  Date/Time   HMDIABEYEEXA No Retinopathy 05/06/2020 12:00 AM    Last diabetic Foot exam:  Lab Results  Component Value Date/Time   HMDIABFOOTEX Done 07/15/2015 12:00 AM        Component Value Date/Time   CHOL 127 12/09/2020 1327   TRIG 103.0 12/09/2020 1327   HDL 36.20 (L) 12/09/2020 1327   CHOLHDL 4 12/09/2020 1327   VLDL 20.6 12/09/2020 1327   LDLCALC 70 12/09/2020 1327    Hepatic Function Latest Ref Rng & Units 03/05/2021 12/09/2020 03/25/2020  Total Protein 6.5 - 8.1 g/dL 6.8 6.5 6.6  Albumin 3.5 - 5.0 g/dL 3.5 3.6 3.8  AST 15 - 41 U/L _0 ALT 0 - 44 U/L _1 Alk Phosphatase 38 - 126 U/L 82 89 68  Total Bilirubin 0.3 - 1.2 mg/dL 0.9 0.6 0.9  Bilirubin, Direct 0.0 - 0.3 mg/dL - - -    Lab Results  Component Value Date/Time   TSH 1.61 06/07/2018 02:13 PM   TSH 0.300 (L) 05/25/2018 04:46 PM   TSH 1.76 11/01/2017 11:08 AM    CBC Latest Ref Rng & Units 03/17/2021 03/10/2021 03/09/2021  WBC 4.0 - 10.5 K/uL 10.6(H) 7.9 6.5  Hemoglobin 13.0 - 17.0 g/dL 13.8 11.9(L) 11.2(L)  Hematocrit 39.0 - 52.0 % 41.1 35.3(L) 33.7(L)  Platelets 150.0 - 400.0 K/uL 236.0 241 188    No results found for: VD25OH  Clinical ASCVD: Yes  The ASCVD Risk score (Mikey BussingDC Jr., et al., 2013) failed to calculate for the following reasons:   The 2013 ASCVD risk score is only  valid for ages 104 to 72     Social History   Tobacco Use  Smoking Status Former   Packs/day: 2.50   Years: 25.00   Pack years: 62.50   Types: Cigarettes, Cigars   Quit date: 10/11/1980   Years since quitting: 40.5  Smokeless Tobacco Never   BP Readings from Last 3 Encounters:  04/10/21 122/68  04/02/21 130/90  03/19/21 (!) 84/50   Pulse Readings from Last 3 Encounters:  04/10/21 71  04/02/21 78  03/19/21 86   Wt Readings from Last 3 Encounters:  04/10/21 187 lb 4 oz (84.9 kg)  04/02/21 190 lb (86.2 kg)  03/19/21 183 lb (83 kg)    Assessment: Review of patient past medical history, allergies, medications,  health status, including review of consultants reports, laboratory and other test data, was performed as part of comprehensive evaluation and provision of chronic care management services.   SDOH:  (Social Determinants of Health) assessments and interventions performed:  SDOH Interventions    Flowsheet Row Most Recent Value  SDOH Interventions   Financial Strain Interventions Intervention Not Indicated  Physical Activity Interventions Other (Comments)       CCM Care Plan  Allergies  Allergen Reactions   Hydrocodone Other (See Comments)    "given to him in the hospital; went thru withdrawals once home; dr said not to take it again" (09/27/2012)   Tramadol Other (See Comments)    "given to him in the hospital; went thru withdrawals once home; dr said not to take it again" (09/27/2012)   Tadalafil Other (See Comments)    headache, backache   Avelox [Moxifloxacin Hydrochloride] Itching and Other (See Comments)    Headache    Medications Reviewed Today     Reviewed by Cherre Robins, PharmD (Pharmacist) on 04/22/21 at 63  Med List Status: <None>   Medication Order Taking? Sig Documenting Provider Last Dose Status Informant  acetaminophen (TYLENOL) 500 MG tablet [id number] Yes Take 500 mg by mouth every 6 (six) hours as needed for headache. [provider] Taking Active Self  albuterol (VENTOLIN HFA) 108 (90 Base) MCG/ACT inhaler [id number] Yes Inhale 2 puffs into the lungs every 6 (six) hours as needed for wheezing or shortness of breath. Jonetta Osgood, MD Taking Active   amLODipine (NORVASC) 10 MG tablet [id number] Yes Take 1 tablet (10 mg total) by mouth daily. Colon Branch, MD Taking Active   aspirin 81 MG tablet [id number] Yes Take 81 mg by mouth every morning.  [provider] Taking Active Self  atorvastatin (LIPITOR) 20 MG tablet [id number] Yes Take 1 tablet (20 mg total) by mouth at bedtime. Colon Branch, MD Taking Active   budesonide (PULMICORT) 0.5 MG/2ML  nebulizer solution [id number] Yes Take 2 mLs (0.5 mg total) by nebulization 2 (two) times daily. Colon Branch, MD Taking Active Self  calcium-vitamin D (OSCAL WITH D) 500-200 MG-UNIT tablet [id number] Yes Take 1 tablet by mouth every morning. [provider] Taking Active Self  carvedilol (COREG) 25 MG tablet [id number] Yes Take 1 tablet (25 mg total) by mouth 2 (two) times daily with a meal. Colon Branch, MD Taking Active Self  diphenhydramine-acetaminophen (TYLENOL PM EXTRA STRENGTH) 25-500 MG TABS tablet [id number] Yes Take 1 tablet by mouth at bedtime. Colon Branch, MD Taking Active Self  furosemide (LASIX) 40 MG tablet [id number] Yes TAKE 2 TABLETS DAILY EXCEPT ON MONDAY, WEDNESDAY, AND FRIDAY TAKE 2 TABLETS IN THE MORNING A 1 TABLET IN THE AFTERNOON  Patient taking differently: Take 40-80 mg by mouth See admin instructions. 80 mg Tuesday,Thursday,Saturday, Sunday. 80 mg in the morning and 40 mg in the afternoon on Monday,Wednesday, Friday   Colon Branch, MD Taking Active Self  ipratropium-albuterol (DUONEB) 0.5-2.5 (3) MG/3ML SOLN [id number] Yes Take 3 mLs by nebulization 2 (two) times daily as needed.  Patient taking differently: Take 3 mLs by nebulization 2 (two) times daily as needed (shortness of breath).   Colon Branch, MD Taking Active Self  Lancets [facility] ULTRASOFT) lancets [id number] Yes Check blood sugars no more than twice daily Colon Branch, MD Taking Active Self  losartan (COZAAR) 50 MG tablet [id number] Yes Take 1 tablet (50 mg total) by mouth daily. Colon Branch, MD Taking Active Self  Multiple Vitamins-Minerals (MULTIVITAMINS THER. W/MINERALS) TABS tablet [id number] Yes Take 1 tablet by mouth daily. Colon Branch, MD Taking Active Self  nitroGLYCERIN (NITROSTAT) 0.4 MG SL tablet [id number] No Place 1 tablet (0.4 mg total) under the tongue every 5 (five) minutes x 3 doses as needed for chest pain.  Patient not taking: No sig reported   Colon Branch, MD Not Taking Active            Med  Note Alinda Money, [place] Nov 26, 2019  9:58 AM) PRN  Roma Schanz test strip [id number] Yes USE TO CHECK BLOOD SUGAR NO MORE THAN TWICE A DAY Paz, Alda Berthold, MD Taking Active Self  pantoprazole (PROTONIX) 40 MG tablet [id number] Yes Take 40 mg by mouth daily. [provider] Taking Active Self  sitaGLIPtin (JANUVIA) 100 MG tablet [id number] Yes Take 1 tablet (100 mg total) by mouth daily. Colon Branch, MD Taking Active Self  tamsulosin [facility]) 0.4 MG CAPS capsule [id number] Yes Take 1 capsule (0.4 mg total) by mouth daily after supper. Colon Branch, MD Taking Active Self            Patient Active Problem List   Diagnosis Date Noted   Cor pulmonale (chronic) ([place]) 08/05/2020   COPD with acute exacerbation ([place]) 12/22/2018   COPD GOLD 0 spirometry/ AB  12/22/2018   PCP NOTES >>> 07/15/2015   Annual physical exam 05/08/2012   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   Sinoatrial node dysfunction ([place])    Non-ischemic cardiomyopathy ([place]) 09/06/2011   Chronic diastolic (congestive) heart failure ([place])    PAD (peripheral artery disease) ([place]) 10/22/2010   DERMATITIS, STASIS 09/14/2010   Chronic pulmonary hypertension secondary to elevated L H pressures  04/15/2009   ERECTILE DYSFUNCTION 01/06/2009   Hyperlipidemia 12/13/2007   Essential hypertension 12/13/2007   Anxiety--insomnia 08/14/2007   DM II (diabetes mellitus, type II), controlled ([place]) 01/02/2007   Permanent atrial fibrillation 01/02/2007   Benign enlargement of prostate 01/02/2007    Immunization History  Administered Date(s) Administered   Fluad Quad(high Dose 65+) 06/26/2019, 08/11/2020   Influenza Split 10/01/2011   Influenza Whole 08/14/2007, 07/11/2009, 09/14/2010   Influenza, High Dose Seasonal PF 09/14/2013, 07/15/2015, 07/09/2016, 07/05/2017, 08/03/2018   Influenza,inj,Quad PF,6+ Mos 07/05/2014   Influenza-Unspecified 08/11/2012   Janssen (J&J) SARS-COV-2 Vaccination 01/12/2020, 09/19/2020   PFIZER Comirnaty(Gray  Top)Covid-19 Tri-Sucrose Vaccine 03/18/2021   Pneumococcal Conjugate-13 03/12/2015   Pneumococcal Polysaccharide-23 07/11/2004, 03/30/2009, 11/01/2017, 05/11/2019   Td 03/12/2003   Tetanus 09/14/2013   Zoster, Live 04/20/2011    Conditions to be addressed/monitored: Atrial Fibrillation, CHF, HTN, HLD, COPD, DMII, and enlarged prostate, PVD, GERD  Care Plan : General Pharmacy (Adult)  Updates made by Cherre Robins, [facility]  since 04/22/2021 12:00 AM     Problem: Hypertension, Hyperlipidemia/PAD/Cardiomyopathy, Diabetes, Afib, COPD   Priority: High  Onset Date: 12/15/2020     Goal: Patient-Specific Goal   Start Date: 12/15/2020  Expected End Date: 06/17/2021  Recent Progress: On track  Priority: High  Note:   Current Barriers:  No specific barriers identified at this visit Updated refill needed for losasrtan  Pharmacist Clinical Goal(s):  Over the next 120 days, patient will achieve adherence to monitoring guidelines and medication adherence to achieve therapeutic efficacy maintain control of blood pressure and blood sugar as evidenced by home monitoring  adhere to prescribed medication regimen as evidenced by fill dates through collaboration with PharmD and provider.   Interventions: 1:1 collaboration with Colon Branch, MD regarding development and update of comprehensive plan of care as evidenced by provider attestation and co-signature Inter-disciplinary care team collaboration (see longitudinal plan of care) Comprehensive medication review performed; medication list updated in electronic medical record  Hypertension (BP goal <130/80) Controlled Current treatment: Amlodipine 67m daily  Losartan 50 mg daily Carvedilol 224mtwice daily Medications previously tried: none noted Current home readings: none reported today  Denies hypotensive/hypertensive symptoms; denies dizziness or swelling Interventions:  Educated on BP goals and benefits of medications for prevention of heart  attack, stroke and kidney damage; Counseled to monitor BP at home 1 or 2 times per week, document, and provide log at future appointments Refill history reviewed - last losartan 5034mas filled for 90 DS on 12/05/2020. Patient reports he is still taking. Coordinated updated Rx to be sent to [facility] mail order. Recommended to continue current medications for blood pressure  Hyperlipidemia: (LDL goal < 70) / PAD Controlled Current treatment: Atorvastatin 53m47mily Aspirin 81mg71mly Nitroglucerin 0.4mg a27meeded (last needed over 1 year ago)  Medications previously tried: none noted  Interventions:  Reviewed most recent lipid panel, LDL now at goal.  Congratulated him on this! Reviewed cholesterol goals;  Benefits of statin for ASCVD risk reduction (discussed at previous visits) Recommended to continue current medication for cholesterol lowering  Diabetes (A1c goal <7%) Controlled Current medications: Januvia 100mg d57m Patient endorses that Januvia is affordable - has Medicare and insurance with previous employer Rx coverage.  Medications previously tried: metformin (heart failure?), glyburide (D/C to deprescribe per low a1c) Current home glucose readings No specific numbers; patient reports "normal" Highest 140 Denies BG <80 Denies hypoglycemic/hyperglycemic symptoms Current meal patterns: states his wife and granddaughter make sure he "eats right" - would not prescribe specifics His granddaughter that lives with them prepares all meals.  Interventions:  Educated onA1c and blood sugar goals; Reviewed home blood glucose readings and reviewed goals  Fasting blood glucose goal (before meals) = 80 to 130 Blood glucose goal after a meal = less than 180  Counseled to check feet daily and get yearly eye exams Recommended to continue current medication  Atrial Fibrillation (Goal: prevent stroke and major bleeding) Controlled Current treatment: Rate control: Carvedilol 25mg  t73m daily Anticoagulation: Aspirin 81mg dai32medications previously tried: coumadin (intracranial bleed in 2013) Interventions: none (addressed at previous visits)  Counseled on importance of regular laboratory monitoring;  Denies any symptoms Recommended to continue current medication  COPD (Goal: control symptoms and prevent exacerbations) Controlled Managed by pulmonology - Dr Wert CurrMelvyn Novastreatment  Pulmicort (Budesonide) 0.5mg/2mL n9mlized twice daily DuoNeb (Ipratropium/Albuterol) 0.5/2.5/3mL nebuli53m twice daily as needed Albuterol HFA - inhale 1 or 2 puff every 4 to 6 hours as needed for shortness of  breath or wheezing Supplement oxygen - 2 to 3 L to keep O2 sats >90% Medications previously tried: none noted  Gold Grade: Gold 0 per DR Wert's last note 03/2021 Current COPD Classification:  B (high sx, <2 exacerbations/yr) Hospitalization once in last year for COPD exacerbation - 03/05/2021 - 03/11/2021  Prescribed prednisone x 3 days Interventions:  Reviewed medication regimen for COPD Reviewed O2 sat measurement; continue to check daily Recommended to continue current medication  Patient Goals/Self-Care Activities Over the next 120 days, patient will:  take medications as prescribed check blood pressure daily, document, and provide at future appointments target a minimum of 150 minutes of moderate intensity exercise weekly  Follow Up Plan: The care management team will reach out to the patient again over the next 120 days.      Medication Assistance: None required.  Patient affirms current coverage meets needs.  Patient's preferred pharmacy is:  [id number] - [place], [place] - [address] [place] [address] [place] [address] Phone: [phone] Fax: [phone]  [facility] [place], [place] - [address] [place] [address] Phone: [phone]  Fax: [phone]  Follow Up:  Patient agrees to Care Plan and Follow-up.  Plan: Telephone follow up appointment with care management team member scheduled for:  2 months with CMA; 4 months with clinical pharmacist  Cherre Robins, PharmD Clinical Pharmacist [facility]

## 2021-04-22 NOTE — Patient Instructions (Signed)
Visit Information  PATIENT GOALS:  Goals Addressed             This Visit's Progress    Chronic Care Management Pharmacy Care Plan   On track    CARE PLAN ENTRY (see longitudinal plan of care for additional care plan information)  Current Barriers:  Chronic Disease Management support, education, and care coordination needs related to Hypertension, Hyperlipidemia/PAD/Cardiomyopathy, Diabetes, Afib, COPD, BPH, GERD   Hypertension BP Readings from Last 3 Encounters:  04/10/21 122/68  04/02/21 130/90  03/19/21 (!) 84/50  Pharmacist Clinical Goal(s): Over the next 120 days, patient will work with PharmD and providers to maintain BP goal <130/80 Current regimen:  Amlodipine 10mg  daily  Losartan 50 take 1 tablet daily   Carvedilol 25mg  twice daily Interventions: Collaboration with provider and pharmacy to get update prescription for losartan 50mg  regarding medication management  Recommended he continue to check his blood pressure 2 to 3 time per week Patient self care activities - Over the next 120 days, patient will: Check blood pressure 2 to 3 times per week, document, and provide at future appointments Ensure daily salt intake < 2300 mg/day Continue current medications to lower blood pressure  Hyperlipidemia/PAD/Cardiomyopathy Lab Results  Component Value Date/Time   LDLCALC 70 12/09/2020 01:27 PM  Pharmacist Clinical Goal(s): Over the next 120 days, patient will work with PharmD and providers to maintain LDL goal < 70 Current regimen:  Atorvastatin 20mg  daily Aspirin 81mg  daily Nitroglycerin 0.4mg  as needed Patient self care activities - Over the next 120 days, patient will: Maintain cholesterol medication regimen. Be physically active - goal is to get at least 150 minutes of physical activity per week   Diabetes Lab Results  Component Value Date/Time   HGBA1C 5.9 (H) 03/05/2021 05:29 AM   HGBA1C 6.0 12/09/2020 01:27 PM  Pharmacist Clinical Goal(s): Over the next  120 days, patient will work with PharmD and providers to maintain A1c goal <7% Current regimen:  Januvia 100mg  daily Interventions: Discussed a1c goal  Patient self care activities - Over the next 90 days, patient will: Check blood sugar 2 to 3 times per week, document, and provide at future appointments Reviewed home blood glucose readings and reviewed goals  Fasting blood glucose goal (before meals) = 80 to 130 Blood glucose goal after a meal = less than 180  Contact provider with any episodes of hypoglycemia  GERD / acid reflux Pharmacist Clinical Goal(s) Over the next 120 days, patient will work with PharmD and providers to reduce symptoms of GERD and reduce polypharmacy Current regimen:  Pantoprazole 40mg  daily Interventions: Discussed the risk/benefit of continuation vs discontinuation of long term PPI use  Collaboration with provider regarding medication management (pantoprazole 20mg  to replace pantoprazole 40mg ) Patient self care activities - Over the next 120 days, patient will: Maintain current regimen for GERD / acid reflux  Medication management Pharmacist Clinical Goal(s): Over the next 120 days, patient will work with PharmD and providers to maintain optimal medication adherence Current pharmacy: [facility] Interventions Comprehensive medication review performed. Continue current medication management strategy Patient self care activities - Over the next 120 days, patient will: Focus on medication adherence by filling and taking medications appropriately  Take medications as prescribed Report any questions or concerns to PharmD and/or provider(s)  Please see past updates related to this goal by clicking on the "Past Updates" button in the selected goal           The patient verbalized understanding of  instructions, educational materials, and care plan provided today and declined offer to receive copy of patient instructions, educational  materials, and care plan.   Telephone follow up appointment with care management team member scheduled for: 2 months for CMA check in; 4 months - f/u with clinical pharmacist  Henrene Pastor, PharmD Clinical Pharmacist [facility] [phone]

## 2021-05-18 ENCOUNTER — Other Ambulatory Visit: Payer: Self-pay | Admitting: Internal Medicine

## 2021-05-22 ENCOUNTER — Other Ambulatory Visit: Payer: Self-pay | Admitting: Internal Medicine

## 2021-05-26 ENCOUNTER — Ambulatory Visit (INDEPENDENT_AMBULATORY_CARE_PROVIDER_SITE_OTHER): Payer: Medicare Other

## 2021-05-26 DIAGNOSIS — I428 Other cardiomyopathies: Secondary | ICD-10-CM

## 2021-05-26 LAB — CUP PACEART REMOTE DEVICE CHECK
Battery Remaining Longevity: 13 mo
Battery Voltage: 2.88 V
Brady Statistic AP VP Percent: 0 %
Brady Statistic AP VS Percent: 0 %
Brady Statistic AS VP Percent: 99.38 %
Brady Statistic AS VS Percent: 0.62 %
Brady Statistic RA Percent Paced: 0 %
Brady Statistic RV Percent Paced: 99.69 %
Date Time Interrogation Session: 20220816130125
Implantable Lead Implant Date: 20121128
Implantable Lead Implant Date: 20121128
Implantable Lead Location: 753858
Implantable Lead Location: 753860
Implantable Lead Model: 4194
Implantable Lead Model: 5076
Implantable Pulse Generator Implant Date: 20121128
Lead Channel Impedance Value: 342 Ohm
Lead Channel Impedance Value: 399 Ohm
Lead Channel Impedance Value: 4047 Ohm
Lead Channel Impedance Value: 4047 Ohm
Lead Channel Impedance Value: 418 Ohm
Lead Channel Impedance Value: 418 Ohm
Lead Channel Impedance Value: 437 Ohm
Lead Channel Impedance Value: 513 Ohm
Lead Channel Impedance Value: 570 Ohm
Lead Channel Pacing Threshold Amplitude: 0.625 V
Lead Channel Pacing Threshold Amplitude: 1.25 V
Lead Channel Pacing Threshold Pulse Width: 0.4 ms
Lead Channel Pacing Threshold Pulse Width: 0.4 ms
Lead Channel Sensing Intrinsic Amplitude: 12.75 mV
Lead Channel Sensing Intrinsic Amplitude: 12.75 mV
Lead Channel Setting Pacing Amplitude: 2 V
Lead Channel Setting Pacing Amplitude: 2.25 V
Lead Channel Setting Pacing Pulse Width: 0.4 ms
Lead Channel Setting Pacing Pulse Width: 0.4 ms
Lead Channel Setting Sensing Sensitivity: 4 mV

## 2021-06-03 ENCOUNTER — Other Ambulatory Visit: Payer: Self-pay

## 2021-06-03 ENCOUNTER — Encounter (HOSPITAL_BASED_OUTPATIENT_CLINIC_OR_DEPARTMENT_OTHER): Payer: Self-pay | Admitting: Cardiovascular Disease

## 2021-06-03 ENCOUNTER — Other Ambulatory Visit: Payer: Self-pay | Admitting: Internal Medicine

## 2021-06-03 ENCOUNTER — Ambulatory Visit (INDEPENDENT_AMBULATORY_CARE_PROVIDER_SITE_OTHER): Payer: Medicare Other | Admitting: Cardiovascular Disease

## 2021-06-03 VITALS — BP 112/62 | HR 73 | Ht 69.0 in | Wt 190.6 lb

## 2021-06-03 DIAGNOSIS — I10 Essential (primary) hypertension: Secondary | ICD-10-CM

## 2021-06-03 DIAGNOSIS — I739 Peripheral vascular disease, unspecified: Secondary | ICD-10-CM

## 2021-06-03 DIAGNOSIS — Z950 Presence of cardiac pacemaker: Secondary | ICD-10-CM | POA: Diagnosis not present

## 2021-06-03 DIAGNOSIS — I5032 Chronic diastolic (congestive) heart failure: Secondary | ICD-10-CM

## 2021-06-03 DIAGNOSIS — I4811 Longstanding persistent atrial fibrillation: Secondary | ICD-10-CM

## 2021-06-03 DIAGNOSIS — E785 Hyperlipidemia, unspecified: Secondary | ICD-10-CM | POA: Diagnosis not present

## 2021-06-03 NOTE — Progress Notes (Signed)
Cardiology Office Note   Date:  06/03/2021   ID:  [name], DOB 07-01-1941, MRN [id number]  PCP:  [name], MD  Cardiologist:   [name], MD   No chief complaint on file.    History of Present Illness: CHRISTOP HIPPERT Sr. is a 80 y.o. male with chronic atrial fibrillation s/p AVN ablation and CRT-P, chronic diastolic heart failure, mild aortic stenosis, CAD, hypertension, hyperlipidemia, prior ICH (no anticoagulation), COPD, and PAD who presents for follow-up.  He previously underwent left heart catheterization in 2005 and in 2018 and had no significant coronary artery disease. He had a BiV pacemaker and AV nodal ablation 09/08/11. At that time he had reduced systolic function in the setting of atrial fibrillation with RVR. After placement of the biventricular pacemaker his ejection fraction improved to 50-55% in 2017.  He was admitted 02/2017 with COPD exacerbation and acute on chronic diastolic heart failure. BNP was 491 and TEE had pleural effusions on chest x-ray. He improved with diuresis. Echocardiogram at that time revealed LVEF 55-60%, mild mitral regurgitation, and PA SP 55 mmHg.  He followed up with [name] on 03/09/17 and was doing well.  Mr. Eoff was admitted 12/2018 with a COPD exacerbation  He developed increasing shortness of breath and cough.  In the hospital he required BiPAP, IV steroids and antibiotics.  He was again admitted 04/2019 with acute respiratory failure due to both COPD exacerbation and acute on chronic diastolic heart failure.  She followed up with [name], PA-C on 05/2019 and his weight was stable at 206 pounds.    Mr. Lefevers was admitted 09/2019 with a COPD exacerbation requiring antibiotics and steroids.  He had an echo 08/2020 that revealed LVEF 55 to 60% with mild LVH.  Right ventricular function was normal.  PASP was 50.6 mmHg.  He was then admitted 03/2021 with COPD exacerbation and acute on chronic diastolic heart failure.  Since discharge  she has been feeling well.  He has no physical complaints.  He is excited that his grandchildren are moving to [place] soon.  He has no chest pain and his breathing is stable.  He denies lower extremity edema, orthopnea, or PND.  Past Medical History:  Diagnosis Date   Abnormal CT scan, chest 2012   CT chest, several lymphadenopathies. Sees pulmonary   Anemia    intermittent   Arthritis    "?back" (09/19/2018)   Atrial fibrillation (HCC)    s/p AV node ablation & BiV PPM implantation 09/08/11 (op dictation pending)   BPH (benign prostatic hyperplasia)    Saw Dr [name] 2004, normal renal u/s   Bronchitis 08/26/2017   CAD (coronary artery disease)    CHF (congestive heart failure) (HCC)    Thought primarily to be non-systolic although EF down (EF 48-88% 12/2010, down to 35-40% 09/05/11), cath 2008 with no CAD, nuclear study 07/2011 showing Small area of reversibility in the distal ant/lat wall the left ventricle suspicious for ischemia/septal wall HK but felt to be low risk  (per D/C Summary 07/2011)   Chronic bronchitis (HCC)    "get it ~ q yr"   Chronic respiratory failure with hypoxia and hypercapnia (HCC) 09/14/2010   Followed in Pulmonary clinic/ [facility]/ [place]  Started on 02 2lpm at discharge 09/23/11   - PFT's 10/28/2011  FEV1  1.40 (51%)  with ratio 70 and no better p B2 and DLCO 53 corrects to 101    - PFT's 10/18/2014  FEV1 1.71 (59%) with ratio 68 and no sign change p B2 and DLCO 53 corrects to 85  -  HC03   07/28/20  = 33  -  HCO3   03/17/21     = 31     Heart murmur    HLD (hyperlipidemia)    HTN (hypertension)    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   Pleural effusion 2008   S/p decortication   Pneumonia 08/02/2011   Pulmonary HTN (HCC)    per cath 2008   Type II diabetes  mellitus (HCC) 1999    Past Surgical History:  Procedure Laterality Date   BI-VENTRICULAR PACEMAKER INSERTION Left 09/08/2011   Procedure: BI-VENTRICULAR PACEMAKER INSERTION (CRT-P);  Surgeon: Marinus Maw, MD;  Location: [facility] CATH LAB;  Service: Cardiovascular;  Laterality: Left;   CARDIAC CATHETERIZATION     "couple times; never had balloon or stent" (09/19/2018)   CATARACT EXTRACTION W/ INTRAOCULAR LENS  IMPLANT, BILATERAL Bilateral 08/2018   COLONOSCOPY  03/10/11   normal   INSERT / REPLACE / REMOVE PACEMAKER  09/08/11   pacemaker placement   LUNG DECORTICATION     PLEURAL SCARIFICATION     pneumothorax with fibrothorax  ~ 2010   TONSILLECTOMY     "as a kid"      Current Outpatient Medications  Medication Sig Dispense Refill   acetaminophen (TYLENOL) 500 MG tablet Take 500 mg by mouth every 6 (six) hours as needed for headache.     albuterol (VENTOLIN HFA) 108 (90 Base) MCG/ACT inhaler Inhale 2 puffs into the lungs every 6 (six) hours as needed for wheezing or shortness of breath. 8.5 g 0   amLODipine (NORVASC) 10 MG tablet Take 1 tablet (10 mg total) by mouth daily. 90 tablet 0   aspirin 81 MG tablet Take 81 mg by mouth every morning.      atorvastatin (LIPITOR) 20 MG tablet Take 1 tablet (20 mg total) by mouth at bedtime. 90 tablet 0   budesonide (PULMICORT) 0.5 MG/2ML nebulizer solution Take 2 mLs (0.5 mg total) by nebulization 2 (two) times daily. 360 mL 1   calcium-vitamin D (OSCAL WITH D) 500-200 MG-UNIT tablet Take 1 tablet by mouth every morning.     carvedilol (COREG) 25 MG tablet TAKE 1 TABLET TWICE A DAY WITH MEALS 180 tablet 1   cholecalciferol (VITAMIN D3) 25 MCG (1000 UNIT) tablet Take 1,000 Units by mouth daily.     diphenhydramine-acetaminophen (TYLENOL PM EXTRA STRENGTH) 25-500 MG TABS tablet Take 1 tablet by mouth at bedtime.     furosemide (LASIX) 40 MG tablet TAKE 2 TABLETS DAILY EXCEPT ON MONDAY, WEDNESDAY, AND FRIDAY TAKE 2 TABLETS IN THE MORNING AND 1  TABLET IN THE AFTERNOON 270 tablet 1   ipratropium-albuterol (DUONEB) 0.5-2.5 (3) MG/3ML SOLN Take 3 mLs by nebulization 2 (two) times daily as needed. (Patient taking differently: Take 3 mLs by nebulization 2 (two) times daily as needed (shortness of breath).) 540 mL 1   Lancets (ONETOUCH ULTRASOFT) lancets Check blood sugars no more than twice daily 200 each 12   losartan (COZAAR) 50 MG tablet Take 1 tablet (50 mg total) by mouth daily. 90 tablet 1   Multiple Vitamins-Minerals (MULTIVITAMINS THER. W/MINERALS) TABS tablet Take  1 tablet by mouth daily. 30 each 5   nitroGLYCERIN (NITROSTAT) 0.4 MG SL tablet Place 1 tablet (0.4 mg total) under the tongue every 5 (five) minutes x 3 doses as needed for chest pain. 25 tablet 3   ONETOUCH VERIO test strip USE TO CHECK BLOOD SUGAR NO MORE THAN TWICE A DAY 200 strip 12   pantoprazole (PROTONIX) 40 MG tablet Take 40 mg by mouth daily.     sitaGLIPtin (JANUVIA) 100 MG tablet Take 1 tablet (100 mg total) by mouth daily. 90 tablet 1   tamsulosin (FLOMAX) 0.4 MG CAPS capsule TAKE 1 CAPSULE DAILY AFTER SUPPER 90 capsule 3   No current facility-administered medications for this visit.    Allergies:   Hydrocodone, Tramadol, Tadalafil, and Avelox [moxifloxacin hydrochloride]    Social History:  The patient  reports that he quit smoking about 40 years ago. His smoking use included cigarettes and cigars. He has a 62.50 pack-year smoking history. He has never used smokeless tobacco. He reports that he does not currently use alcohol. He reports that he does not use drugs.   Family History:  The patient's family history includes Breast cancer in his maternal aunt; Coronary artery disease in his father; Diabetes in his father; Drug abuse in his son; Polycythemia in his mother.    ROS:  Please see the history of present illness.   Otherwise, review of systems are positive for none.   All other systems are reviewed and negative.    PHYSICAL EXAM: VS:  BP 112/62    Pulse 73   Ht 5\' 9"  (1.753 m)   Wt 190 lb 9.6 oz (86.5 kg)   BMI 28.15 kg/m  , BMI Body mass index is 28.15 kg/m. GENERAL:  Well appearing HEENT: Pupils equal round and reactive, fundi not visualized, oral mucosa unremarkable.  Edentulous.   NECK:  No jugular venous distention, waveform within normal limits, carotid upstroke brisk and symmetric, no bruits LUNGS:  Clear to auscultation bilaterally HEART:  RRR.  PMI not displaced or sustained,S1 and S2 within normal limits, no S3, no S4, no clicks, no rubs, no murmurs ABD:  Flat, positive bowel sounds normal in frequency in pitch, no bruits, no rebound, no guarding, no midline pulsatile mass, no hepatomegaly, no splenomegaly EXT:  2 plus pulses throughout, no edema, no cyanosis no clubbing SKIN:  No rashes no nodules NEURO:  Cranial nerves II through XII grossly intact, motor grossly intact throughout PSYCH:  Cognitively intact, oriented to person place and time   EKG:  EKG is ordered today. The ekg ordered 05/26/17 demonstrates atrial fibrillation.  Ventricular paced at 75 bpm 08/26/17: VP.  Rate 82 bpm.   11/28/20: VP.  Rate 82 bpm.   06/03/2021: Atrial fibrillation.  Ventricular paced 73 bpm.  Echo 05/26/18: Study Conclusions   - Left ventricle: The cavity size was normal. Wall thickness was   normal. Systolic function was normal. The estimated ejection   fraction was in the range of 55% to 60%. Although no diagnostic   regional wall motion abnormality was identified, this possibility   cannot be completely excluded on the basis of this study. The   study was not technically sufficient to allow evaluation of LV   diastolic dysfunction due to atrial fibrillation. - Aortic valve: There was no stenosis. - Mitral valve: Moderately calcified annulus. There was trivial   regurgitation. - Left atrium: The atrium was mildly dilated. - Right ventricle: The cavity size was moderately dilated. Pacer  wire or catheter noted in right  ventricle. Systolic function was   normal. - Right atrium: The atrium was severely dilated. - Tricuspid valve: Peak RV-RA gradient (S): 32 mm Hg. - Pulmonary arteries: PA peak pressure: 40 mm Hg (S). - Systemic veins: IVC measured 2.2 cm with > 50% respirophasic   variation, suggesting RA pressure 8 mmHg.   Impressions:   - The patient appeared to be in atrial fibrillation. Normal LV size   and systolic function, EF 55-60%. Moderately dilated RV with   normal systolic function. Mild pulmonary hypertension. Biatrial   enlargement. No significant valvular abnormalities.  Recent Labs: 03/05/2021: ALT 13 03/10/2021: B Natriuretic Peptide 314.2; Magnesium 2.0 03/17/2021: BUN 17; Creatinine, Ser 0.87; Hemoglobin 13.8; Platelets 236.0; Potassium 4.7; Sodium 137    Lipid Panel    Component Value Date/Time   CHOL 127 12/09/2020 1327   TRIG 103.0 12/09/2020 1327   HDL 36.20 (L) 12/09/2020 1327   CHOLHDL 4 12/09/2020 1327   VLDL 20.6 12/09/2020 1327   LDLCALC 70 12/09/2020 1327      Wt Readings from Last 3 Encounters:  06/03/21 190 lb 9.6 oz (86.5 kg)  04/10/21 187 lb 4 oz (84.9 kg)  04/02/21 190 lb (86.2 kg)      ASSESSMENT AND PLAN:  # Chronic diastolic heart failure:  # Hypertensive heart disesase: Mr. Raba is euvolemic and doing well.  He did have a admission for heart failure and COPD exacerbation a couple months ago but has recovered and is stable.  He is euvolemic on exam today.  Blood pressures well have been controlled.  Continue amlodipine, carvedilol, losartan, and Lasix.  Could consider switching to [facility] in the future.   # Longstanding Persistent Atrial fibrillation:  S/p AV node ablation with CRT-P.  Followed by Dr. Ladona Ridgel and device was stable 8/19.  Asymptomatic.  Continue carvedilol.  Mr. Buttry is not on anticoagulation due to prior intracranial bleed.  He is doing well on aspirin.    # Chronic hypoxia:  # Severe COPD with frequent hospitalizations: #  Pulmonary hypertension:   Stable.  Continue supplemental oxygen at rest and with exertion.  # PAD: # Hyperlipidemia:  Lipids well-controlled on atorvastatin.  He denies claudication.   Current medicines are reviewed at length with the patient today.  The patient does not have concerns regarding medicines.  The following changes have been made:  none  Labs/ tests ordered today include:   Orders Placed This Encounter  Procedures   EKG 12-Lead      Disposition:   FU with Shanteria Laye C. Duke Salvia, MD, [facility] in 6 months.    Signed, Kinzie Wickes C. Duke Salvia, MD, [facility]  06/03/2021 10:12 AM    [facility]

## 2021-06-03 NOTE — Patient Instructions (Addendum)
Medication Instructions:  Your physician recommends that you continue on your current medications as directed. Please refer to the Current Medication list given to you today.   *If you need a refill on your cardiac medications before your next appointment, please call your pharmacy*  Lab Work: NONE  Testing/Procedures: NONE  Follow-Up: At [facility], you and your health needs are our priority.  As part of our continuing mission to provide you with exceptional heart care, we have created designated Provider Care Teams.  These Care Teams include your primary Cardiologist (physician) and Advanced Practice Providers (APPs -  Physician Assistants and Nurse Practitioners) who all work together to provide you with the care you need, when you need it.  We recommend signing up for the patient portal called "MyChart".  Sign up information is provided on this After Visit Summary.  MyChart is used to connect with patients for Virtual Visits (Telemedicine).  Patients are able to view lab/test results, encounter notes, upcoming appointments, etc.  Non-urgent messages can be sent to your provider as well.   To learn more about what you can do with MyChart, go to [email or web address].    Your next appointment:   6 month(s)  The format for your next appointment:   In Person  Provider:   Tiffany Lynnwood, MD            

## 2021-06-11 ENCOUNTER — Other Ambulatory Visit: Payer: Self-pay | Admitting: Internal Medicine

## 2021-06-15 NOTE — Progress Notes (Signed)
Remote pacemaker transmission.   

## 2021-06-18 ENCOUNTER — Telehealth: Payer: Self-pay | Admitting: Pharmacist

## 2021-06-18 NOTE — Chronic Care Management (AMB) (Signed)
Chronic Care Management Pharmacy Assistant   Name: Bradley REELS Sr.  MRN: [id number] DOB: [birth year]  Reason for Encounter: Disease State General  Recent office visits:  None noted  Recent consult visits:  06/03/21 (Cardiology) [name], MD- Seen for general follow up. EKG completed. Follow up in 6 months.  Hospital visits:  Admitted to the hospital on 03/05/21 due to Shortness of breath. Discharge date was 03/11/21. Discharged from [facility].    New?Medications Started at [facility] Discharge:?? -started None noted  Medication Changes at Hospital Discharge: -Changed None noted  Medications Discontinued at Hospital Discharge: -Stopped None noted  Medications that remain the same after Hospital Discharge:??  -All other medications will remain the same.    Medications: Outpatient Encounter Medications as of 06/18/2021  Medication Sig Note   acetaminophen (TYLENOL) 500 MG tablet Take 500 mg by mouth every 6 (six) hours as needed for headache.    albuterol (VENTOLIN HFA) 108 (90 Base) MCG/ACT inhaler Inhale 2 puffs into the lungs every 6 (six) hours as needed for wheezing or shortness of breath.    amLODipine (NORVASC) 10 MG tablet TAKE 1 TABLET DAILY    aspirin 81 MG tablet Take 81 mg by mouth every morning.     atorvastatin (LIPITOR) 20 MG tablet TAKE 1 TABLET AT BEDTIME    budesonide (PULMICORT) 0.5 MG/2ML nebulizer solution Take 2 mLs (0.5 mg total) by nebulization 2 (two) times daily.    calcium-vitamin D (OSCAL WITH D) 500-200 MG-UNIT tablet Take 1 tablet by mouth every morning.    carvedilol (COREG) 25 MG tablet TAKE 1 TABLET TWICE A DAY WITH MEALS    cholecalciferol (VITAMIN D3) 25 MCG (1000 UNIT) tablet Take 1,000 Units by mouth daily.    diphenhydramine-acetaminophen (TYLENOL PM EXTRA STRENGTH) 25-500 MG TABS tablet Take 1 tablet by mouth at bedtime.    furosemide (LASIX) 40 MG tablet TAKE 2 TABLETS DAILY EXCEPT ON MONDAY, WEDNESDAY, AND FRIDAY TAKE 2  TABLETS IN THE MORNING AND 1 TABLET IN THE AFTERNOON    ipratropium-albuterol (DUONEB) 0.5-2.5 (3) MG/3ML SOLN Take 3 mLs by nebulization 2 (two) times daily as needed. (Patient taking differently: Take 3 mLs by nebulization 2 (two) times daily as needed (shortness of breath).)    Lancets (ONETOUCH ULTRASOFT) lancets Check blood sugars no more than twice daily    losartan (COZAAR) 50 MG tablet Take 1 tablet (50 mg total) by mouth daily.    Multiple Vitamins-Minerals (MULTIVITAMINS THER. W/MINERALS) TABS tablet Take 1 tablet by mouth daily.    nitroGLYCERIN (NITROSTAT) 0.4 MG SL tablet Place 1 tablet (0.4 mg total) under the tongue every 5 (five) minutes x 3 doses as needed for chest pain. 11/26/2019: PRN   ONETOUCH VERIO test strip USE TO CHECK BLOOD SUGAR NO MORE THAN TWICE A DAY    pantoprazole (PROTONIX) 40 MG tablet Take 40 mg by mouth daily.    sitaGLIPtin (JANUVIA) 100 MG tablet Take 1 tablet (100 mg total) by mouth daily.    tamsulosin (FLOMAX) 0.4 MG CAPS capsule TAKE 1 CAPSULE DAILY AFTER SUPPER    No facility-administered encounter medications on file as of 06/18/2021.   Have you had any problems recently with your health? Patient states he has no problems with his health at this time.  Have you had any problems with your pharmacy? Patient states he has no problems with his pharmacy.  What issues or side effects are you having with your medications? Patient states he has no  issues or side effects to his medications.  What would you like me to pass along to Tammy Eckard,CPP for them to help you with?  Patient states there is nothing at this time.  What can we do to take care of you better? Patient states there is nothing at this time.  Star Rating Drugs: Atorvastatin 20 mg Last filled:06/11/21 90 DS Losartan 50 mg Last filled:04/22/21 90 DS Januvia 100 mg Last filled:04/27/21 90 DS  Myriam Carolin Coy, RMA Health Concierge

## 2021-07-09 ENCOUNTER — Telehealth: Payer: Self-pay | Admitting: *Deleted

## 2021-07-09 NOTE — Telephone Encounter (Signed)
Who Is Calling Patient / Member / Family / Caregiver Caller Name MALCOM SELMER Caller Phone Number [phone] Call Type Message Only Information Provided Reason for Call Returning a Call from the Office Initial Comment Caller states he is returning a call. He has a questions about an appointment. He has no symptoms. Disp. Time Disposition Final User 07/08/2021 6:36:36 PM General Information Provided Yes Swanner, Linda Hedges

## 2021-07-09 NOTE — Telephone Encounter (Signed)
Noted and I appreciate his phone call

## 2021-07-09 NOTE — Telephone Encounter (Signed)
FYI

## 2021-07-09 NOTE — Telephone Encounter (Signed)
Patient called back in regards of his after hours call. He states that he got a call from Medicare telling him that Bradley [name] has scheduled an appointment for him on Oct 11 for a back and knee brace among other things. Patient told him that he did not need those, and he was not aware of an appointment that day only of his appointment on 08/11/21. He called medicare back and they told him they did not know who called him to tell him that. Bradley [name] believes this was a scam call and wanted to make Bradley [name] aware that someone was using his name. Patient stated that he can be reached for further questions if needed.

## 2021-07-27 ENCOUNTER — Other Ambulatory Visit: Payer: Self-pay | Admitting: Internal Medicine

## 2021-07-29 ENCOUNTER — Emergency Department (HOSPITAL_COMMUNITY): Payer: Medicare Other

## 2021-07-29 ENCOUNTER — Other Ambulatory Visit: Payer: Self-pay

## 2021-07-29 ENCOUNTER — Inpatient Hospital Stay (HOSPITAL_COMMUNITY)
Admission: EM | Admit: 2021-07-29 | Discharge: 2021-08-03 | DRG: 291 | Disposition: A | Discharge status: Home / Self Care | Payer: Medicare Other | Attending: Internal Medicine | Admitting: Internal Medicine

## 2021-07-29 ENCOUNTER — Encounter (HOSPITAL_COMMUNITY): Payer: Self-pay | Admitting: Emergency Medicine

## 2021-07-29 DIAGNOSIS — Z9981 Dependence on supplemental oxygen: Secondary | ICD-10-CM | POA: Diagnosis not present

## 2021-07-29 DIAGNOSIS — Z7984 Long term (current) use of oral hypoglycemic drugs: Secondary | ICD-10-CM | POA: Diagnosis not present

## 2021-07-29 DIAGNOSIS — E1151 Type 2 diabetes mellitus with diabetic peripheral angiopathy without gangrene: Secondary | ICD-10-CM | POA: Diagnosis present

## 2021-07-29 DIAGNOSIS — Z20822 Contact with and (suspected) exposure to covid-19: Secondary | ICD-10-CM | POA: Diagnosis present

## 2021-07-29 DIAGNOSIS — Z7982 Long term (current) use of aspirin: Secondary | ICD-10-CM

## 2021-07-29 DIAGNOSIS — I110 Hypertensive heart disease with heart failure: Principal | ICD-10-CM | POA: Diagnosis present

## 2021-07-29 DIAGNOSIS — K219 Gastro-esophageal reflux disease without esophagitis: Secondary | ICD-10-CM | POA: Diagnosis present

## 2021-07-29 DIAGNOSIS — Z833 Family history of diabetes mellitus: Secondary | ICD-10-CM

## 2021-07-29 DIAGNOSIS — I5023 Acute on chronic systolic (congestive) heart failure: Secondary | ICD-10-CM | POA: Diagnosis not present

## 2021-07-29 DIAGNOSIS — Z7951 Long term (current) use of inhaled steroids: Secondary | ICD-10-CM | POA: Diagnosis not present

## 2021-07-29 DIAGNOSIS — J9622 Acute and chronic respiratory failure with hypercapnia: Secondary | ICD-10-CM | POA: Diagnosis present

## 2021-07-29 DIAGNOSIS — Z87891 Personal history of nicotine dependence: Secondary | ICD-10-CM | POA: Diagnosis not present

## 2021-07-29 DIAGNOSIS — I482 Chronic atrial fibrillation, unspecified: Secondary | ICD-10-CM | POA: Diagnosis present

## 2021-07-29 DIAGNOSIS — I2510 Atherosclerotic heart disease of native coronary artery without angina pectoris: Secondary | ICD-10-CM | POA: Diagnosis present

## 2021-07-29 DIAGNOSIS — E119 Type 2 diabetes mellitus without complications: Secondary | ICD-10-CM

## 2021-07-29 DIAGNOSIS — E1169 Type 2 diabetes mellitus with other specified complication: Secondary | ICD-10-CM | POA: Diagnosis present

## 2021-07-29 DIAGNOSIS — Z8673 Personal history of transient ischemic attack (TIA), and cerebral infarction without residual deficits: Secondary | ICD-10-CM

## 2021-07-29 DIAGNOSIS — N400 Enlarged prostate without lower urinary tract symptoms: Secondary | ICD-10-CM | POA: Diagnosis present

## 2021-07-29 DIAGNOSIS — I509 Heart failure, unspecified: Secondary | ICD-10-CM

## 2021-07-29 DIAGNOSIS — Z79899 Other long term (current) drug therapy: Secondary | ICD-10-CM | POA: Diagnosis not present

## 2021-07-29 DIAGNOSIS — I4891 Unspecified atrial fibrillation: Secondary | ICD-10-CM | POA: Diagnosis present

## 2021-07-29 DIAGNOSIS — R0602 Shortness of breath: Secondary | ICD-10-CM | POA: Diagnosis present

## 2021-07-29 DIAGNOSIS — J441 Chronic obstructive pulmonary disease with (acute) exacerbation: Secondary | ICD-10-CM | POA: Diagnosis present

## 2021-07-29 DIAGNOSIS — Z950 Presence of cardiac pacemaker: Secondary | ICD-10-CM | POA: Diagnosis not present

## 2021-07-29 DIAGNOSIS — Z8249 Family history of ischemic heart disease and other diseases of the circulatory system: Secondary | ICD-10-CM

## 2021-07-29 DIAGNOSIS — I272 Pulmonary hypertension, unspecified: Secondary | ICD-10-CM | POA: Diagnosis present

## 2021-07-29 DIAGNOSIS — J9621 Acute and chronic respiratory failure with hypoxia: Secondary | ICD-10-CM | POA: Diagnosis present

## 2021-07-29 DIAGNOSIS — I10 Essential (primary) hypertension: Secondary | ICD-10-CM | POA: Diagnosis not present

## 2021-07-29 DIAGNOSIS — E782 Mixed hyperlipidemia: Secondary | ICD-10-CM | POA: Diagnosis not present

## 2021-07-29 LAB — TROPONIN I (HIGH SENSITIVITY)
Troponin I (High Sensitivity): 8 ng/L (ref <18)
Troponin I (High Sensitivity): 8 ng/L (ref <18)

## 2021-07-29 LAB — COMPREHENSIVE METABOLIC PANEL
ALT: 13 U/L (ref 0–44)
AST: 26 U/L (ref 15–41)
Albumin: 3.4 g/dL — ABNORMAL LOW (ref 3.5–5.0)
Alkaline Phosphatase: 90 U/L (ref 38–126)
Anion gap: 9 (ref 5–15)
BUN: 14 mg/dL (ref 8–23)
CO2: 28 mmol/L (ref 22–32)
Calcium: 8.4 mg/dL — ABNORMAL LOW (ref 8.9–10.3)
Chloride: 100 mmol/L (ref 98–111)
Creatinine, Ser: 1.01 mg/dL (ref 0.61–1.24)
GFR, Estimated: >60 mL/min (ref >60)
Glucose, Bld: 128 mg/dL — ABNORMAL HIGH (ref 70–99)
Potassium: 4.3 mmol/L (ref 3.5–5.1)
Sodium: 137 mmol/L (ref 135–145)
Total Bilirubin: 1 mg/dL (ref 0.3–1.2)
Total Protein: 6.9 g/dL (ref 6.5–8.1)

## 2021-07-29 LAB — CBC WITH DIFFERENTIAL/PLATELET
Abs Immature Granulocytes: 0.04 10*3/uL (ref 0.00–0.07)
Basophils Absolute: 0 10*3/uL (ref 0.0–0.1)
Basophils Relative: 1 %
Eosinophils Absolute: 0 10*3/uL (ref 0.0–0.5)
Eosinophils Relative: 1 %
HCT: 43.4 % (ref 39.0–52.0)
Hemoglobin: 14 g/dL (ref 13.0–17.0)
Immature Granulocytes: 1 %
Lymphocytes Relative: 29 %
Lymphs Abs: 2.4 10*3/uL (ref 0.7–4.0)
MCH: 30.9 pg (ref 26.0–34.0)
MCHC: 32.3 g/dL (ref 30.0–36.0)
MCV: 95.8 fL (ref 80.0–100.0)
Monocytes Absolute: 0.8 10*3/uL (ref 0.1–1.0)
Monocytes Relative: 9 %
Neutro Abs: 5 10*3/uL (ref 1.7–7.7)
Neutrophils Relative %: 59 %
Platelets: 206 10*3/uL (ref 150–400)
RBC: 4.53 MIL/uL (ref 4.22–5.81)
RDW: 13.2 % (ref 11.5–15.5)
WBC: 8.3 10*3/uL (ref 4.0–10.5)
nRBC: 0 % (ref 0.0–0.2)

## 2021-07-29 LAB — RESP PANEL BY RT-PCR (FLU A&B, COVID) ARPGX2
Influenza A by PCR: NEGATIVE
Influenza B by PCR: NEGATIVE
SARS Coronavirus 2 by RT PCR: NEGATIVE

## 2021-07-29 LAB — I-STAT ARTERIAL BLOOD GAS, ED
Acid-Base Excess: 5 mmol/L — ABNORMAL HIGH (ref 0.0–2.0)
Bicarbonate: 31.8 mmol/L — ABNORMAL HIGH (ref 20.0–28.0)
Calcium, Ion: 1.14 mmol/L — ABNORMAL LOW (ref 1.15–1.40)
HCT: 38 % — ABNORMAL LOW (ref 39.0–52.0)
Hemoglobin: 12.9 g/dL — ABNORMAL LOW (ref 13.0–17.0)
O2 Saturation: 86 %
Potassium: 4.2 mmol/L (ref 3.5–5.1)
Sodium: 138 mmol/L (ref 135–145)
TCO2: 33 mmol/L — ABNORMAL HIGH (ref 22–32)
pCO2 arterial: 53.1 mmHg — ABNORMAL HIGH (ref 32.0–48.0)
pH, Arterial: 7.385 (ref 7.350–7.450)
pO2, Arterial: 53 mmHg — ABNORMAL LOW (ref 83.0–108.0)

## 2021-07-29 LAB — BRAIN NATRIURETIC PEPTIDE: B Natriuretic Peptide: 170.3 pg/mL — ABNORMAL HIGH (ref 0.0–100.0)

## 2021-07-29 MED ORDER — AMLODIPINE BESYLATE 10 MG PO TABS
10.0000 mg | ORAL_TABLET | Freq: Every day | ORAL | Status: DC
  Administered 2021-07-30 – 2021-07-31 (×2): 10 mg via ORAL
  Filled 2021-07-29: qty 2
  Filled 2021-07-29: qty 1

## 2021-07-29 MED ORDER — ASPIRIN EC 81 MG PO TBEC
81.0000 mg | DELAYED_RELEASE_TABLET | Freq: Every morning | ORAL | Status: DC
  Administered 2021-07-30 – 2021-08-03 (×5): 81 mg via ORAL
  Filled 2021-07-29 (×5): qty 1

## 2021-07-29 MED ORDER — METHYLPREDNISOLONE SODIUM SUCC 125 MG IJ SOLR
120.0000 mg | Freq: Every day | INTRAMUSCULAR | Status: DC
  Administered 2021-07-30 – 2021-08-01 (×4): 120 mg via INTRAVENOUS
  Filled 2021-07-29 (×4): qty 2

## 2021-07-29 MED ORDER — NITROGLYCERIN 0.4 MG SL SUBL: 0.4000 mg | SUBLINGUAL_TABLET | SUBLINGUAL | Status: DC | PRN

## 2021-07-29 MED ORDER — ACETAMINOPHEN 500 MG PO TABS
500.0000 mg | ORAL_TABLET | Freq: Every day | ORAL | Status: DC
  Administered 2021-07-30 – 2021-08-02 (×5): 500 mg via ORAL
  Filled 2021-07-29 (×5): qty 1

## 2021-07-29 MED ORDER — ATORVASTATIN CALCIUM 10 MG PO TABS
20.0000 mg | ORAL_TABLET | Freq: Every day | ORAL | Status: DC
  Administered 2021-07-30 – 2021-08-02 (×5): 20 mg via ORAL
  Filled 2021-07-29 (×5): qty 2

## 2021-07-29 MED ORDER — DIPHENHYDRAMINE-APAP (SLEEP) 25-500 MG PO TABS: 1.0000 | ORAL_TABLET | Freq: Every day | ORAL | Status: DC

## 2021-07-29 MED ORDER — CARVEDILOL 25 MG PO TABS
25.0000 mg | ORAL_TABLET | Freq: Two times a day (BID) | ORAL | Status: DC
  Administered 2021-07-30 – 2021-08-03 (×9): 25 mg via ORAL
  Filled 2021-07-29 (×8): qty 1
  Filled 2021-07-29: qty 8

## 2021-07-29 MED ORDER — IPRATROPIUM-ALBUTEROL 0.5-2.5 (3) MG/3ML IN SOLN: 3.0000 mL | RESPIRATORY_TRACT | Status: DC | PRN

## 2021-07-29 MED ORDER — ONDANSETRON HCL 4 MG/2ML IJ SOLN: 4.0000 mg | Freq: Four times a day (QID) | INTRAMUSCULAR | Status: DC | PRN

## 2021-07-29 MED ORDER — FUROSEMIDE 10 MG/ML IJ SOLN
80.0000 mg | Freq: Once | INTRAMUSCULAR | Status: AC
  Administered 2021-07-29: 80 mg via INTRAVENOUS
  Filled 2021-07-29: qty 8

## 2021-07-29 MED ORDER — ACETAMINOPHEN 325 MG PO TABS: 650.0000 mg | ORAL_TABLET | Freq: Four times a day (QID) | ORAL | Status: DC | PRN

## 2021-07-29 MED ORDER — ACETAMINOPHEN 650 MG RE SUPP: 650.0000 mg | Freq: Four times a day (QID) | RECTAL | Status: DC | PRN

## 2021-07-29 MED ORDER — FUROSEMIDE 10 MG/ML IJ SOLN
80.0000 mg | Freq: Two times a day (BID) | INTRAMUSCULAR | Status: DC
  Administered 2021-07-30 – 2021-08-01 (×5): 80 mg via INTRAVENOUS
  Filled 2021-07-29 (×5): qty 8

## 2021-07-29 MED ORDER — LOSARTAN POTASSIUM 50 MG PO TABS
50.0000 mg | ORAL_TABLET | Freq: Every day | ORAL | Status: DC
  Administered 2021-07-30 – 2021-07-31 (×2): 50 mg via ORAL
  Filled 2021-07-29 (×2): qty 1

## 2021-07-29 MED ORDER — PANTOPRAZOLE SODIUM 40 MG PO TBEC
40.0000 mg | DELAYED_RELEASE_TABLET | Freq: Every day | ORAL | Status: DC
  Administered 2021-07-30 – 2021-08-03 (×5): 40 mg via ORAL
  Filled 2021-07-29 (×5): qty 1

## 2021-07-29 MED ORDER — ALBUTEROL SULFATE (2.5 MG/3ML) 0.083% IN NEBU
5.0000 mg | INHALATION_SOLUTION | Freq: Once | RESPIRATORY_TRACT | Status: AC
  Administered 2021-07-29: 5 mg via RESPIRATORY_TRACT
  Filled 2021-07-29: qty 6

## 2021-07-29 MED ORDER — ENOXAPARIN SODIUM 40 MG/0.4ML IJ SOSY
40.0000 mg | PREFILLED_SYRINGE | INTRAMUSCULAR | Status: DC
  Administered 2021-07-30 – 2021-07-31 (×3): 40 mg via SUBCUTANEOUS
  Filled 2021-07-29 (×3): qty 0.4

## 2021-07-29 MED ORDER — IPRATROPIUM-ALBUTEROL 0.5-2.5 (3) MG/3ML IN SOLN
3.0000 mL | Freq: Four times a day (QID) | RESPIRATORY_TRACT | Status: DC
  Administered 2021-07-30 – 2021-07-31 (×7): 3 mL via RESPIRATORY_TRACT
  Filled 2021-07-29 (×7): qty 3

## 2021-07-29 MED ORDER — ONDANSETRON HCL 4 MG PO TABS: 4.0000 mg | ORAL_TABLET | Freq: Four times a day (QID) | ORAL | Status: DC | PRN

## 2021-07-29 MED ORDER — INSULIN ASPART 100 UNIT/ML IJ SOLN
0.0000 [IU] | Freq: Three times a day (TID) | INTRAMUSCULAR | Status: DC
  Administered 2021-07-30: 3 [IU] via SUBCUTANEOUS
  Administered 2021-07-30: 8 [IU] via SUBCUTANEOUS
  Administered 2021-07-30 (×2): 5 [IU] via SUBCUTANEOUS
  Administered 2021-07-31: 3 [IU] via SUBCUTANEOUS
  Administered 2021-07-31: 5 [IU] via SUBCUTANEOUS
  Administered 2021-07-31: 2 [IU] via SUBCUTANEOUS
  Administered 2021-07-31: 5 [IU] via SUBCUTANEOUS
  Administered 2021-08-01: 11 [IU] via SUBCUTANEOUS
  Administered 2021-08-01: 3 [IU] via SUBCUTANEOUS
  Administered 2021-08-01: 2 [IU] via SUBCUTANEOUS
  Administered 2021-08-01: 4 [IU] via SUBCUTANEOUS
  Administered 2021-08-02: 2 [IU] via SUBCUTANEOUS
  Administered 2021-08-02: 5 [IU] via SUBCUTANEOUS
  Administered 2021-08-02: 11 [IU] via SUBCUTANEOUS

## 2021-07-29 MED ORDER — DIPHENHYDRAMINE HCL 25 MG PO CAPS
25.0000 mg | ORAL_CAPSULE | Freq: Every day | ORAL | Status: DC
  Administered 2021-07-30 – 2021-08-02 (×5): 25 mg via ORAL
  Filled 2021-07-29 (×5): qty 1

## 2021-07-29 MED ORDER — POLYETHYLENE GLYCOL 3350 17 G PO PACK: 17.0000 g | PACK | Freq: Every day | ORAL | Status: DC | PRN

## 2021-07-29 NOTE — H&P (Signed)
History and Physical    Bradley [name] HEN:[id number] DOB: 30-Jun-1941 DOA: 07/29/2021  PCP: [name], MD  Patient coming from: Home via EMS   Chief Complaint:  Chief Complaint  Patient presents with   Shortness of Breath     HPI:    80 year old male with past medical history of chronic atrial fibrillation not on anticoagulation (S/P ablation placement of CRT-P in 2012), history of intracranial hemorrhage, systolic congestive heart failure  (EF as low as 35-40% 2012, now 55-60% 08/2020), COPD, chronic respiratory failure on 2 L of oxygen via nasal cannula (per pulm note 03/2021), peripheral artery disease, pulmonary hypertension, gastroesophageal reflux disease, hypertension, hyperlipidemia, anxiety disorder, benign prostatic hyperplasia and diabetes mellitus type 2 who presents to [facility] emergency department via EMS with complaints of shortness of breath.   Patient explains that he has been experiencing increasing shortness of breath beyond his baseline for the past week.  Shortness breath is severe in intensity, worse with minimal exertion and improved with rest.  Patient complains of associated cough that is intermittently productive with small amounts of green sputum as well as associated wheezing.  Patient also seems to endorse a 1 week history of pillow orthopnea but denies paroxysmal nocturnal dyspnea, weight gain or peripheral edema.  Patient denies any associated chest pain, fevers, recent travel, sick contacts or contact with confirmed COVID-19 infection.  Patient states that he is compliant with all medications.  Symptoms continue to worsen until EMS was contacted who promptly came to evaluate the patient and noted the patient was hypoxic with oxygen saturations in the 70s and 80s despite being on his home supplemental oxygen.  Patient was then promptly brought into [facility] emergency department for evaluation.  Due to significant hypoxia patient was  eventually transitioned to nonrebreather mask.  ER provider noted that patient's weight has increased 26 pounds since most recent cardiology visit 8/24.  Chest x-ray was performed revealing vascular congestion and early interstitial edema.  Patient was administered treatments of nebulized albuterol as well as 80 mg of intravenous Lasix.  The hospitalist group was then called to assess the patient for admission to the hospital.   Review of Systems:   Review of Systems  Constitutional:  Positive for malaise/fatigue.  Respiratory:  Positive for cough, sputum production and shortness of breath.   Neurological:  Positive for weakness.  All other systems reviewed and are negative.  Past Medical History:  Diagnosis Date   Abnormal CT scan, chest 2012   CT chest, several lymphadenopathies. Sees pulmonary   Anemia    intermittent   Arthritis    "?back" (09/19/2018)   Atrial fibrillation (HCC)    s/p AV node ablation & BiV PPM implantation 09/08/11 (op dictation pending)   BPH (benign prostatic hyperplasia)    Saw Dr [name] 2004, normal renal u/s   Bronchitis 08/26/2017   CAD (coronary artery disease)    CHF (congestive heart failure) (HCC)    Thought primarily to be non-systolic although EF down (EF 36-14% 12/2010, down to 35-40% 09/05/11), cath 2008 with no CAD, nuclear study 07/2011 showing Small area of reversibility in the distal ant/lat wall the left ventricle suspicious for ischemia/septal wall HK but felt to be low risk  (per D/C Summary 07/2011)   Chronic bronchitis (HCC)    "get it ~ q yr"   Chronic respiratory failure with hypoxia and hypercapnia (HCC) 09/14/2010   Followed in Pulmonary clinic/ [facility]/ [place]  Started on 02  2lpm at discharge 09/23/11   - PFT's 10/28/2011  FEV1  1.40 (51%)  with ratio 70 and no better p B2 and DLCO 53 corrects to 101    - PFT's 10/18/2014    FEV1 1.71 (59%) with ratio 68 and no sign change p B2 and DLCO 53 corrects to 85  -  HC03   07/28/20  =  33  -  HCO3   03/17/21     = 31     Heart murmur    HLD (hyperlipidemia)    HTN (hypertension)    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   Pleural effusion 2008   S/p decortication   Pneumonia 08/02/2011   Pulmonary HTN (HCC)    per cath 2008   Type II diabetes mellitus (HCC) 1999    Past Surgical History:  Procedure Laterality Date   BI-VENTRICULAR PACEMAKER INSERTION Left 09/08/2011   Procedure: BI-VENTRICULAR PACEMAKER INSERTION (CRT-P);  Surgeon: Marinus Maw, MD;  Location: [facility] CATH LAB;  Service: Cardiovascular;  Laterality: Left;   CARDIAC CATHETERIZATION     "couple times; never had balloon or stent" (09/19/2018)   CATARACT EXTRACTION W/ INTRAOCULAR LENS  IMPLANT, BILATERAL Bilateral 08/2018   COLONOSCOPY  03/10/11   normal   INSERT / REPLACE / REMOVE PACEMAKER  09/08/11   pacemaker placement   LUNG DECORTICATION     PLEURAL SCARIFICATION     pneumothorax with fibrothorax  ~ 2010   TONSILLECTOMY     "as a kid"      reports that he quit smoking about 40 years ago. His smoking use included cigarettes and cigars. He has a 62.50 pack-year smoking history. He has never used smokeless tobacco. He reports that he does not currently use alcohol. He reports that he does not use drugs.  Allergies  Allergen Reactions   Hydrocodone Other (See Comments)    "given to him in the hospital; went thru withdrawals once home; dr said not to take it again" (09/27/2012)   Tramadol Other (See Comments)    "given to him in the hospital; went thru withdrawals once home; dr said not to take it again" (09/27/2012)   Tadalafil Other (See Comments)    headache, backache   Avelox [Moxifloxacin Hydrochloride] Itching and Other (See Comments)    Headache    Family History  Problem Relation Age of Onset    Diabetes Father    Coronary artery disease Father    Polycythemia Mother    Drug abuse Son    Breast cancer Maternal Aunt    Prostate cancer Neg Hx    Colon cancer Neg Hx      Prior to Admission medications   Medication Sig Start Date End Date Taking? Authorizing Provider  sitaGLIPtin (JANUVIA) 100 MG tablet TAKE 1 TABLET DAILY 07/27/21   [name], MD  acetaminophen (TYLENOL) 500 MG tablet Take 500 mg by mouth every 6 (six) hours as needed for headache.    [provider]  albuterol (VENTOLIN HFA) 108 (90 Base) MCG/ACT inhaler Inhale 2 puffs into the lungs every 6 (six) hours as needed for wheezing or shortness of breath. 03/11/21   Maretta Bees, MD  amLODipine (NORVASC) 10 MG tablet TAKE 1 TABLET DAILY 06/11/21  [name], MD  aspirin 81 MG tablet Take 81 mg by mouth every morning.     [provider]  atorvastatin (LIPITOR) 20 MG tablet TAKE 1 TABLET AT BEDTIME 06/11/21   Paz, Nolon Rod, MD  budesonide (PULMICORT) 0.5 MG/2ML nebulizer solution Take 2 mLs (0.5 mg total) by nebulization 2 (two) times daily. 11/26/19   [name], MD  calcium-vitamin D (OSCAL WITH D) 500-200 MG-UNIT tablet Take 1 tablet by mouth every morning.    [provider]  carvedilol (COREG) 25 MG tablet TAKE 1 TABLET TWICE A DAY WITH MEALS 05/22/21   [name], MD  cholecalciferol (VITAMIN D3) 25 MCG (1000 UNIT) tablet Take 1,000 Units by mouth daily.    [provider]  diphenhydramine-acetaminophen (TYLENOL PM EXTRA STRENGTH) 25-500 MG TABS tablet Take 1 tablet by mouth at bedtime. 05/31/19   [name], MD  furosemide (LASIX) 40 MG tablet TAKE 2 TABLETS DAILY EXCEPT ON MONDAY, WEDNESDAY, AND FRIDAY TAKE 2 TABLETS IN THE MORNING AND 1 TABLET IN THE AFTERNOON 05/18/21   [name], MD  ipratropium-albuterol (DUONEB) 0.5-2.5 (3) MG/3ML SOLN Take 3 mLs by nebulization 2 (two) times daily as needed. Patient taking differently: Take 3 mLs by nebulization 2 (two) times daily as  needed (shortness of breath). 11/26/19   [name], MD  Lancets [facility] ULTRASOFT) lancets Check blood sugars no more than twice daily 03/08/17   [name], MD  losartan (COZAAR) 50 MG tablet Take 1 tablet (50 mg total) by mouth daily. 04/22/21   [name], MD  Multiple Vitamins-Minerals (MULTIVITAMINS THER. W/MINERALS) TABS tablet Take 1 tablet by mouth daily. 09/14/13   [name], MD  nitroGLYCERIN (NITROSTAT) 0.4 MG SL tablet Place 1 tablet (0.4 mg total) under the tongue every 5 (five) minutes x 3 doses as needed for chest pain. 01/10/19   [name], MD  [facility] VERIO test strip USE TO CHECK BLOOD SUGAR NO MORE THAN TWICE A DAY 02/09/21   [name], MD  pantoprazole (PROTONIX) 40 MG tablet Take 40 mg by mouth daily.    [provider]  tamsulosin (FLOMAX) 0.4 MG CAPS capsule TAKE 1 CAPSULE DAILY AFTER SUPPER 06/03/21   [name], MD    Physical Exam: Vitals:   07/29/21 2300 07/29/21 2315 07/29/21 2330 07/29/21 2345  BP: 122/72 135/76 116/70 119/71  Pulse: 70 83 70 69  Resp: (!) 27 (!) 34 (!) 27 (!) 26  Temp:      TempSrc:      SpO2: 99% 98% 99% 98%  Weight:      Height:        Constitutional: Awake alert and oriented x3, no associated distress.   Skin: Notable ecchymoses of the anterior surfaces of the bilateral lower extremities.  Good skin turgor noted. Eyes: Pupils are equally reactive to light.  No evidence of scleral icterus or conjunctival pallor.  ENMT: Moist mucous membranes noted.  Posterior pharynx clear of any exudate or lesions.   Neck: normal, supple, no masses, no thyromegaly.  Slight elevation of jugular venous pulse noted at 45 degrees.  Respiratory: Notable bibasilar rales.  Notable significant bilateral rhonchi.  Intermittent expiratory wheezing noted.  Increased respiratory effort without evidence of accessory muscle use.   Cardiovascular: Regular rate and rhythm, no murmurs / rubs / gallops. No extremity edema. 2+ pedal pulses. No carotid bruits.   Chest:   Nontender without crepitus or deformity.  Back:   Nontender without crepitus or deformity. Abdomen: Abdomen is soft and nontender.  No evidence of intra-abdominal masses.  Positive bowel sounds noted in all quadrants.   Musculoskeletal: No joint deformity upper and lower extremities. Good ROM, no contractures. Normal muscle tone.  Neurologic: CN 2-12 grossly intact. Sensation intact.  Patient moving all 4 extremities spontaneously.  Patient is following all commands.  Patient is responsive to verbal stimuli.   Psychiatric: Patient exhibits normal mood with appropriate affect.  Patient seems to possess insight as to their current situation.     Labs on Admission: I have personally reviewed following labs and imaging studies -   CBC: Recent Labs  Lab 07/29/21 1800 07/29/21 2148  WBC 8.3  --   NEUTROABS 5.0  --   HGB 14.0 12.9*  HCT 43.4 38.0*  MCV 95.8  --   PLT 206  --    Basic Metabolic Panel: Recent Labs  Lab 07/29/21 1800 07/29/21 2148  NA 137 138  K 4.3 4.2  CL 100  --   CO2 28  --   GLUCOSE 128*  --   BUN 14  --   CREATININE 1.01  --   CALCIUM 8.4*  --    GFR: Estimated Creatinine Clearance: 67.9 mL/min (by C-G formula based on SCr of 1.01 mg/dL). Liver Function Tests: Recent Labs  Lab 07/29/21 1800  AST 26  ALT 13  ALKPHOS 90  BILITOT 1.0  PROT 6.9  ALBUMIN 3.4*   No results for input(s): LIPASE, AMYLASE in the last 168 hours. No results for input(s): AMMONIA in the last 168 hours. Coagulation Profile: No results for input(s): INR, PROTIME in the last 168 hours. Cardiac Enzymes: No results for input(s): CKTOTAL, CKMB, CKMBINDEX, TROPONINI in the last 168 hours. BNP (last 3 results) No results for input(s): PROBNP in the last 8760 hours. HbA1C: No results for input(s): HGBA1C in the last 72 hours. CBG: No results for input(s): GLUCAP in the last 168 hours. Lipid Profile: No results for input(s): CHOL, HDL, LDLCALC, TRIG, CHOLHDL, LDLDIRECT  in the last 72 hours. Thyroid Function Tests: No results for input(s): TSH, T4TOTAL, FREET4, T3FREE, THYROIDAB in the last 72 hours. Anemia Panel: No results for input(s): VITAMINB12, FOLATE, FERRITIN, TIBC, IRON, RETICCTPCT in the last 72 hours. Urine analysis:    Component Value Date/Time   COLORURINE YELLOW 07/15/2015 1440   APPEARANCEUR CLEAR 07/15/2015 1440   LABSPEC 1.015 07/15/2015 1440   PHURINE 6.0 07/15/2015 1440   GLUCOSEU NEGATIVE 07/15/2015 1440   HGBUR TRACE-LYSED (A) 07/15/2015 1440   BILIRUBINUR NEGATIVE 07/15/2015 1440   KETONESUR TRACE (A) 07/15/2015 1440   PROTEINUR NEGATIVE 07/06/2012 0341   UROBILINOGEN 1.0 07/15/2015 1440   NITRITE NEGATIVE 07/15/2015 1440   LEUKOCYTESUR NEGATIVE 07/15/2015 1440    Radiological Exams on Admission - Personally Reviewed: DG Chest Portable 1 View  Result Date: 07/29/2021 CLINICAL DATA:  Shortness of breath EXAM: PORTABLE CHEST 1 VIEW COMPARISON:  03/18/2021 FINDINGS: Left pacer remains in place, unchanged. Cardiomegaly, vascular congestion. Interstitial prominence in the mid and lower lungs may reflect early interstitial edema. No effusions or acute bony abnormality. Aortic atherosclerosis. IMPRESSION: Cardiomegaly with vascular congestion and possible early interstitial edema. Electronically Signed   By: Charlett Nose M.D.   On: 07/29/2021 18:53    EKG: Personally reviewed.  Rhythm is atrial fibrillation flutter, paced rhythm with heart rate of 77 bpm.  No dynamic ST segment changes appreciated.  Assessment/Plan  * Acute on chronic respiratory failure  with hypoxia (HCC) Complicated picture with a 1 week history of progressively worsening shortness of breath and cough ABG consistent with acute hypoxic respiratory failure with PaO2 of 53.1 on 12 L of salter high flow nasal cannula On chest x-ray does show some evidence of patchy bilateral infiltrates that could possibly be pulmonary edema with notable significant increase in weight  by approximately 12 kg over the past month physical exam is not overwhelmingly compelling for acute congestive heart failure. Due to somewhat unclear picture treating patient both for COPD exacerbation with steroids and aggressive bronchodilator therapy in addition to intravenous diuretics for now. In the meantime, will obtain CT angiogram of the chest to further evaluate infiltrates as well as ruling out pulmonary embolism. Admitting to progressive bed Close clinical monitoring as patient is at high risk of rapid clinical decompensation COVID-19 testing negative.  Acute on chronic systolic CHF (congestive heart failure) (HCC) With patchy infiltrates on chest x-ray and documented increasing white blood pressure troponins over the past month congestive heart failure is at least in part contributing to patient's current acute hypoxic respiratory failure. Intravenous diuretics Strict input and output monitoring Daily weights  COPD with acute exacerbation (HCC) COPD exacerbation likely contributing. Aggressive bronchodilator therapy with both scheduled and as needed nebulized treatments Systemic steroids with Solu-Medrol intravenously every 12 hours   Chronic atrial fibrillation (HCC) Currently rate controlled in a paced rhythm Not on anticoagulation in the outpatient setting due to history of intracranial hemorrhage Continue home regimen of Coreg Monitoring on telemetry   Essential hypertension Resume patients home regimen or oral antihypertensives Titrate antihypertensive regimen as necessary to achieve adequate BP control PRN intravenous antihypertensives for excessively elevated blood pressure    Mixed diabetic hyperlipidemia associated with type 2 diabetes mellitus (HCC) Continuing home regimen of lipid lowering therapy.   Type 2 diabetes mellitus without complication, without long-term current use of insulin (HCC) Patient been placed on Accu-Cheks before every meal and nightly  with sliding scale insulin Hemoglobin A1C ordered Diabetic Diet   GERD without esophagitis Continuing home regimen of daily PPI therapy.       Code Status:  Full code  code status decision has been confirmed with: patient Family Communication: Deferred   Status is: Inpatient  Remains inpatient appropriate because: Need for significant amounts of supplemental oxygen for significant acute hypoxic respiratory failure, intravenous steroids, aggressive bronchodilator therapy and extremely high risk of rapid clinical decompensation        Marinda Elk MD [facility] Pager [phone]  If 7PM-7AM, please contact night-coverage [email or web address] Use universal [place] password for that web site. If you do not have the password, please call the hospital operator.  07/30/2021, 12:04 AM

## 2021-07-29 NOTE — Assessment & Plan Note (Addendum)
   Aggressive bronchodilator therapy with both scheduled and as needed nebulized treatments Systemic steroids with Solu-Medrol-- lungs improved on 10/22-wean to PO for the AM
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
## 2021-07-29 NOTE — Assessment & Plan Note (Addendum)
   ABG consistent with acute hypoxic respiratory failure with PaO2 of 53.1 on 12 L of salter high flow nasal cannula   chest x-ray showed some evidence of patchy bilateral infiltrates that could possibly be pulmonary edema with notable significant increase in weight by approximately 12 kg over the past month  Due to somewhat unclear picture treating patient both for COPD exacerbation with steroids and aggressive bronchodilator therapy in addition to intravenous diuretics for now.  CT Scan negative for PE -sats ok 90-92% if no increased work of breathing/symptoms Wean down to 2L

## 2021-07-29 NOTE — ED Provider Notes (Addendum)
[facility] EMERGENCY DEPARTMENT Provider Note   CSN: [id number] Arrival date & time: 07/29/21  1743     History Chief Complaint  Patient presents with   Shortness of Breath    Bradley CHICOINE Sr. is a 80 y.o. male.   Shortness of Breath Associated symptoms: cough and wheezing   Associated symptoms: no fever and no rash   Patient presents with shortness of breath.  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  Has been more short of breath.  Sats down in the 80s and even 70s per EMS.  States he thinks it is worse because of the change in temperature.  States she will usually flareup with this.  Using inhaler without relief at home.  By EMS received 10 mg albuterol 0.5 of Atrovent and 125 mg of Solu-Medrol.  Still short of breath.  No chest pain.  Also has a history of CHF.  States he feels as if he is doing well in terms of his fluid, but his weight appears to be up around 13 kg from previous weights.    Past Medical History:  Diagnosis Date   Abnormal CT scan, chest 2012   CT chest, several lymphadenopathies. Sees pulmonary   Anemia    intermittent   Arthritis    "?back" (09/19/2018)   Atrial fibrillation (HCC)    s/p AV node ablation & BiV PPM implantation 09/08/11 (op dictation pending)   BPH (benign prostatic hyperplasia)    Saw Dr [name] 2004, normal renal u/s   Bronchitis 08/26/2017   CAD (coronary artery disease)    CHF (congestive heart failure) (HCC)    Thought primarily to be non-systolic although EF down (EF 62-37% 12/2010, down to 35-40% 09/05/11), cath 2008 with no CAD, nuclear study 07/2011 showing Small area of reversibility in the distal ant/lat wall the left ventricle suspicious for ischemia/septal wall HK but felt to be low risk  (per D/C Summary 07/2011)   Chronic bronchitis (HCC)    "get it ~ q yr"   Chronic respiratory failure with hypoxia and hypercapnia (HCC) 09/14/2010   Followed in Pulmonary clinic/ [facility]/ [place]  Started on  02 2lpm at discharge 09/23/11   - PFT's 10/28/2011  FEV1  1.40 (51%)  with ratio 70 and no better p B2 and DLCO 53 corrects to 101    - PFT's 10/18/2014    FEV1 1.71 (59%) with ratio 68 and no sign change p B2 and DLCO 53 corrects to 85  -  HC03   07/28/20  = 33  -  HCO3   03/17/21     = 31     Heart murmur    HLD (hyperlipidemia)    HTN (hypertension)    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/2020   COPD with acute exacerbation (HCC) 12/22/2018   COPD GOLD 0 spirometry/ AB  12/22/2018   PCP NOTES >>> 07/15/2015   Annual physical exam 05/08/2012   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   Sinoatrial node dysfunction (HCC)    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   Chronic diastolic (congestive) heart failure (HCC)    PAD (peripheral artery disease) (HCC) 10/22/2010   DERMATITIS, STASIS 09/14/2010   Chronic pulmonary hypertension secondary to elevated L H pressures  04/15/2009   ERECTILE DYSFUNCTION 01/06/2009   Mixed diabetic hyperlipidemia associated with type 2 diabetes mellitus (HCC) 12/13/2007   Essential hypertension 12/13/2007   Anxiety--insomnia 08/14/2007   Type 2 diabetes mellitus without complication, without long-term current use of insulin (HCC) 01/02/2007   Chronic atrial fibrillation (HCC) 01/02/2007   Benign enlargement of prostate 01/02/2007    Past Surgical History:   Procedure Laterality Date   BI-VENTRICULAR PACEMAKER INSERTION Left 09/08/2011   Procedure: BI-VENTRICULAR PACEMAKER INSERTION (CRT-P);  Surgeon: Marinus Maw, MD;  Location: [facility] CATH LAB;  Service: Cardiovascular;  Laterality: Left;   CARDIAC CATHETERIZATION     "couple times; never had balloon or stent" (09/19/2018)   CATARACT EXTRACTION W/ INTRAOCULAR LENS  IMPLANT, BILATERAL Bilateral 08/2018   COLONOSCOPY  03/10/11   normal   INSERT / REPLACE / REMOVE PACEMAKER  09/08/11   pacemaker placement   LUNG DECORTICATION     PLEURAL SCARIFICATION     pneumothorax with fibrothorax  ~ 2010   TONSILLECTOMY     "as a kid"        Family History  Problem Relation Age of Onset   Diabetes Father    Coronary artery disease Father    Polycythemia Mother    Drug abuse Son    Breast cancer Maternal Aunt    Prostate cancer Neg Hx    Colon cancer Neg Hx     Social History   Tobacco Use   Smoking status: Former    Packs/day: 2.50    Years: 25.00    Pack years: 62.50    Types: Cigarettes, Cigars    Quit date: 10/11/1980    Years since quitting: 40.8   Smokeless tobacco: Never  Vaping Use   Vaping Use: Never used  Substance Use Topics   Alcohol use: Not Currently    Comment: 1 can of beer ever 4-5 months   Drug use: No    Home Medications Prior to Admission medications   Medication Sig Start Date End Date Taking? Authorizing Provider  acetaminophen (TYLENOL) 500 MG tablet Take 500 mg by mouth every 6 (six) hours as needed for headache.   Yes [provider]  albuterol (VENTOLIN HFA) 108 (90 Base) MCG/ACT inhaler Inhale 2 puffs into the lungs every 6 (six) hours as needed for wheezing or shortness of breath. 03/11/21  Yes Ghimire, Werner Lean, MD  aspirin 81 MG tablet Take 81 mg by mouth every morning.    Yes [provider]  atorvastatin (LIPITOR) 20 MG tablet TAKE 1 TABLET AT BEDTIME Patient taking differently: Take 20 mg by mouth at bedtime. 06/11/21  Yes Paz, Elita Quick  E, MD  budesonide (PULMICORT) 0.5 MG/2ML nebulizer solution Take 2 mLs (0.5 mg total) by nebulization 2 (two) times daily. 11/26/19  Yes Paz, Nolon Rod, MD  calcium-vitamin D (OSCAL WITH D) 500-200 MG-UNIT tablet Take 1 tablet by mouth every morning.   Yes [provider]  carvedilol (COREG) 25 MG tablet TAKE 1 TABLET TWICE A DAY WITH MEALS 05/22/21  Yes [name], MD  cholecalciferol (VITAMIN D3) 25 MCG (1000 UNIT) tablet Take 1,000 Units by mouth daily.   Yes [provider]  furosemide (LASIX) 40 MG tablet TAKE 2 TABLETS DAILY EXCEPT ON MONDAY, WEDNESDAY, AND FRIDAY TAKE 2 TABLETS IN THE MORNING AND 1 TABLET IN THE AFTERNOON Patient taking differently: Take 40-80 mg by mouth See admin instructions. Take 80 mg (2 tablets) daily except on Monday, Wednesday, and Friday. Taking 2 tablets in the morning and 1( 40 mg) tablet in the afternoon. 05/18/21  Yes Paz, Nolon Rod, MD  ipratropium-albuterol (DUONEB) 0.5-2.5 (3) MG/3ML SOLN Take 3 mLs by nebulization 2 (two) times daily as needed. Patient taking differently: Take 3 mLs by nebulization 2 (two) times daily as needed (shortness of breath). 11/26/19  Yes [name], MD  Multiple Vitamins-Minerals (MULTIVITAMINS THER. W/MINERALS) TABS tablet Take 1 tablet by mouth daily. 09/14/13  Yes Paz, Nolon Rod, MD  nitroGLYCERIN (NITROSTAT) 0.4 MG SL tablet Place 1 tablet (0.4 mg total) under the tongue every 5 (five) minutes x 3 doses as needed for chest pain. 01/10/19  Yes Paz, Nolon Rod, MD  pantoprazole (PROTONIX) 40 MG tablet Take 40 mg by mouth daily.   Yes [provider]  sitaGLIPtin (JANUVIA) 100 MG tablet TAKE 1 TABLET DAILY Patient taking differently: Take 100 mg by mouth daily. 07/27/21  Yes Paz, Nolon Rod, MD  tamsulosin (FLOMAX) 0.4 MG CAPS capsule TAKE 1 CAPSULE DAILY AFTER SUPPER Patient taking differently: Take 0.4 mg by mouth daily after supper. 06/03/21  Yes Paz, Nolon Rod, MD  amLODipine (NORVASC) 10 MG tablet Take 0.5 tablets (5 mg total)  by mouth daily. 08/03/21   Joseph Art, DO  doxycycline (VIBRA-TABS) 100 MG tablet Take 1 tablet (100 mg total) by mouth every 12 (twelve) hours. 08/03/21   Joseph Art, DO  guaiFENesin (MUCINEX) 600 MG 12 hr tablet Take 1 tablet (600 mg total) by mouth 2 (two) times daily. 08/03/21   Joseph Art, DO  Lancets (ONETOUCH ULTRASOFT) lancets Check blood sugars no more than twice daily 03/08/17   [name], MD  loratadine (CLARITIN) 10 MG tablet Take 1 tablet (10 mg total) by mouth daily. 08/04/21   Joseph Art, DO  losartan (COZAAR) 50 MG tablet Take 0.5 tablets (25 mg total) by mouth daily. 08/03/21   Joseph Art, DO  ONETOUCH VERIO test strip USE TO CHECK BLOOD SUGAR NO MORE THAN TWICE A DAY 02/09/21   [name], MD  predniSONE (DELTASONE) 50 MG tablet Take 1 tablet (50 mg total) by mouth daily with breakfast for 3 days. 08/04/21 08/07/21  Joseph Art, DO    Allergies    Hydrocodone, Tramadol, Tadalafil, and Avelox [moxifloxacin hydrochloride]  Review of Systems   Review of Systems  Constitutional:  Negative for fever.  HENT:  Negative for congestion.   Respiratory:  Positive for cough, shortness of breath and wheezing.   Gastrointestinal:  Negative for anal bleeding.  Genitourinary:  Negative for flank pain.  Musculoskeletal:  Negative for back pain.  Skin:  Negative for rash.  Neurological:  Negative for weakness.  Psychiatric/Behavioral:  Negative for confusion.    Physical Exam Updated Vital Signs BP (!) 110/98   Pulse 67   Temp 98.1 F (36.7 C) (Oral)   Resp 19   Ht  (1.753 m)   Wt 82.5 kg   SpO2 96%   BMI 26.86 kg/m   Physical Exam Vitals  and nursing note reviewed.  HENT:     Head: Normocephalic.  Cardiovascular:     Rate and Rhythm: Normal rate and regular rhythm.  Pulmonary:     Effort: Tachypnea present.     Comments: Harsh breath sounds. Chest:     Chest wall: No tenderness.  Abdominal:     Tenderness: There is no abdominal  tenderness.  Musculoskeletal:     Cervical back: Neck supple.     Right lower leg: Edema present.     Left lower leg: Edema present.  Skin:    General: Skin is warm.     Capillary Refill: Capillary refill takes less than 2 seconds.  Neurological:     Mental Status: He is alert and oriented to person, place, and time.    ED Results / Procedures / Treatments   Labs (all labs ordered are listed, but only abnormal results are displayed) Labs Reviewed  COMPREHENSIVE METABOLIC PANEL - Abnormal; Notable for the following components:      Result Value   Glucose, Bld 128 (*)    Calcium 8.4 (*)    Albumin 3.4 (*)    All other components within normal limits  BRAIN NATRIURETIC PEPTIDE - Abnormal; Notable for the following components:   B Natriuretic Peptide 170.3 (*)    All other components within normal limits  HEMOGLOBIN A1C - Abnormal; Notable for the following components:   Hgb A1c MFr Bld 5.9 (*)    All other components within normal limits  COMPREHENSIVE METABOLIC PANEL - Abnormal; Notable for the following components:   Glucose, Bld 249 (*)    Calcium 8.5 (*)    Albumin 3.2 (*)    All other components within normal limits  CBC WITH DIFFERENTIAL/PLATELET - Abnormal; Notable for the following components:   WBC 3.8 (*)    All other components within normal limits  GLUCOSE, CAPILLARY - Abnormal; Notable for the following components:   Glucose-Capillary 172 (*)    All other components within normal limits  BASIC METABOLIC PANEL - Abnormal; Notable for the following components:   CO2 34 (*)    Glucose, Bld 130 (*)    Calcium 8.6 (*)    All other components within normal limits  GLUCOSE, CAPILLARY - Abnormal; Notable for the following components:   Glucose-Capillary 237 (*)    All other components within normal limits  GLUCOSE, CAPILLARY - Abnormal; Notable for the following components:   Glucose-Capillary 135 (*)    All other components within normal limits  GLUCOSE, CAPILLARY  - Abnormal; Notable for the following components:   Glucose-Capillary 151 (*)    All other components within normal limits  GLUCOSE, CAPILLARY - Abnormal; Notable for the following components:   Glucose-Capillary 242 (*)    All other components within normal limits  CBC - Abnormal; Notable for the following components:   WBC 10.8 (*)    RBC 3.99 (*)    Hemoglobin 12.3 (*)    HCT 36.4 (*)    All other components within normal limits  BASIC METABOLIC PANEL - Abnormal; Notable for the following components:   CO2 35 (*)    Glucose, Bld 130 (*)    BUN 25 (*)    Calcium 8.4 (*)    Anion gap 3 (*)    All other components within normal limits  GLUCOSE, CAPILLARY - Abnormal; Notable for the following components:   Glucose-Capillary 237 (*)    All other components within normal limits  GLUCOSE, CAPILLARY - Abnormal;  Notable for the following components:   Glucose-Capillary 148 (*)    All other components within normal limits  GLUCOSE, CAPILLARY - Abnormal; Notable for the following components:   Glucose-Capillary 151 (*)    All other components within normal limits  GLUCOSE, CAPILLARY - Abnormal; Notable for the following components:   Glucose-Capillary 317 (*)    All other components within normal limits  BASIC METABOLIC PANEL - Abnormal; Notable for the following components:   Chloride 96 (*)    CO2 36 (*)    Glucose, Bld 134 (*)    Calcium 8.3 (*)    All other components within normal limits  GLUCOSE, CAPILLARY - Abnormal; Notable for the following components:   Glucose-Capillary 247 (*)    All other components within normal limits  GLUCOSE, CAPILLARY - Abnormal; Notable for the following components:   Glucose-Capillary 149 (*)    All other components within normal limits  GLUCOSE, CAPILLARY - Abnormal; Notable for the following components:   Glucose-Capillary 159 (*)    All other components within normal limits  GLUCOSE, CAPILLARY - Abnormal; Notable for the following  components:   Glucose-Capillary 222 (*)    All other components within normal limits  GLUCOSE, CAPILLARY - Abnormal; Notable for the following components:   Glucose-Capillary 323 (*)    All other components within normal limits  GLUCOSE, CAPILLARY - Abnormal; Notable for the following components:   Glucose-Capillary 106 (*)    All other components within normal limits  I-STAT ARTERIAL BLOOD GAS, ED - Abnormal; Notable for the following components:   pCO2 arterial 53.1 (*)    pO2, Arterial 53 (*)    Bicarbonate 31.8 (*)    TCO2 33 (*)    Acid-Base Excess 5.0 (*)    Calcium, Ion 1.14 (*)    HCT 38.0 (*)    Hemoglobin 12.9 (*)    All other components within normal limits  CBG MONITORING, ED - Abnormal; Notable for the following components:   Glucose-Capillary 274 (*)    All other components within normal limits  CBG MONITORING, ED - Abnormal; Notable for the following components:   Glucose-Capillary 232 (*)    All other components within normal limits  RESP PANEL BY RT-PCR (FLU A&B, COVID) ARPGX2  CBC WITH DIFFERENTIAL/PLATELET  MAGNESIUM  TROPONIN I (HIGH SENSITIVITY)  TROPONIN I (HIGH SENSITIVITY)    EKG EKG Interpretation  Date/Time:  Wednesday July 29 2021 18:02:06 EDT Ventricular Rate:  77 PR Interval:    QRS Duration: 126 QT Interval:  417 QTC Calculation: 472 R Axis:   -78 Text Interpretation: Afib/flutter and ventricular-paced rhythm No further analysis attempted due to paced rhythm Confirmed by Benjiman Core [phone]) on 07/29/2021 8:23:43 PM  Radiology No results found.  Procedures Procedures   Medications Ordered in ED Medications  albuterol (PROVENTIL) (2.5 MG/3ML) 0.083% nebulizer solution 5 mg (5 mg Nebulization Given 07/29/21 1813)  furosemide (LASIX) injection 80 mg (80 mg Intravenous Given 07/29/21 2034)  iohexol (OMNIPAQUE) 350 MG/ML injection 80 mL (80 mLs Intravenous Contrast Given 07/30/21 0056)  methylPREDNISolone sodium succinate  (SOLU-MEDROL) 125 mg/2 mL injection 60 mg (60 mg Intravenous Given 08/02/21 6503)    ED Course  I have reviewed the triage vital signs and the nursing notes.  Pertinent labs & imaging results that were available during my care of the patient were reviewed by me and considered in my medical decision making (see chart for details).    MDM Rules/Calculators/A&P  Patient presents with shortness of breath.  States it feels like his previous COPD.  Is on chronic oxygen for same.  States he always gets worse in the fall.  However x-ray shows some vascular congestion.  His weight is up around 26 pounds from recent cardiology visit.  BNP only mildly elevated.  Some edema on both legs.  May also be component of CHF.  Pneumonia felt less likely.  COVID test pending.  However has increased oxygen requirement.  Now on nonrebreather.  Has had steroids by EMS along with breathing treatment of the breathing treatment here.  Will admit to hospitalist  CRITICAL CARE Performed by: Benjiman Core Total critical care time: 30 minutes Critical care time was exclusive of separately billable procedures and treating other patients. Critical care was necessary to treat or prevent imminent or life-threatening deterioration. Critical care was time spent personally by me on the following activities: development of treatment plan with patient and/or surrogate as well as nursing, discussions with consultants, evaluation of patient's response to treatment, examination of patient, obtaining history from patient or surrogate, ordering and performing treatments and interventions, ordering and review of laboratory studies, ordering and review of radiographic studies, pulse oximetry and re-evaluation of patient's condition.  Final Clinical Impression(s) / ED Diagnoses Final diagnoses:  COPD exacerbation (HCC)  Congestive heart failure, unspecified HF chronicity, unspecified heart failure type (HCC)     Rx / DC Orders ED Discharge Orders          Ordered    loratadine (CLARITIN) 10 MG tablet  Daily        08/03/21 0825    predniSONE (DELTASONE) 50 MG tablet  Daily with breakfast        08/03/21 0825    amLODipine (NORVASC) 10 MG tablet  Daily        08/03/21 0825    doxycycline (VIBRA-TABS) 100 MG tablet  Every 12 hours        08/03/21 0825    guaiFENesin (MUCINEX) 600 MG 12 hr tablet  2 times daily        08/03/21 0825    losartan (COZAAR) 50 MG tablet  Daily        08/03/21 0825    Diet - low sodium heart healthy        08/03/21 0825    Increase activity slowly        08/03/21 0825    Heart Failure patients record your daily weight using the same scale at the same time of day        08/03/21 0825    (HEART FAILURE PATIENTS) Call MD:  Anytime you have any of the following symptoms: 1) 3 pound weight gain in 24 hours or 5 pounds in 1 week 2) shortness of breath, with or without a dry hacking cough 3) swelling in the hands, feet or stomach 4) if you have to sleep on extra pillows at night in order to breathe.        08/03/21 0825    Diet Carb Modified        08/03/21 0825    AMB Referral to [facility]       Comments: Embedded:  [place] SW, [facility], [name], MD   Please assign to Embedded [facility] RN Care Coordinator for Chronic Care Management follow up calls and assess for further needs.  Questions please call:   Charlesetta Shanks, RN BSN CCM [facility]  [phone] business mobile  phone Toll free office [phone]  Fax number: [phone] [email or web address]   08/03/21 1052             Benjiman Core, MD 07/29/21 2107    Benjiman Core, MD 08/06/21 0700    Benjiman Core, MD 08/06/21 [phone]

## 2021-07-29 NOTE — ED Notes (Signed)
RT at bedside for ABG draw. RT placing pt on HFNC.

## 2021-07-29 NOTE — ED Notes (Signed)
Admitting provider bedside.  Lab does not have d.dimer blood, RN will collect and sign

## 2021-07-29 NOTE — Assessment & Plan Note (Signed)
.   Continuing home regimen of lipid lowering therapy.  

## 2021-07-29 NOTE — Progress Notes (Signed)
Pt placed on HFNC 12L due to 02 dropping in the low 80's on 6L [place].  Pt states he doesn't feel SOB, no WOB noted at this time.

## 2021-07-29 NOTE — ED Notes (Signed)
Security at bedside removing pts knife from room.

## 2021-07-29 NOTE — Assessment & Plan Note (Addendum)
   With patchy infiltrates on chest x-ray and documented increasing white blood pressure troponins over the past month congestive heart failure is at least in part contributing to patient's current acute hypoxic respiratory failure.  Strict input and output monitoring  Daily weights (down 40lbs if weights are accurate)  D/c IV lasix as suspect he has diuresed enough

## 2021-07-29 NOTE — ED Notes (Addendum)
Pt placed on 6L via [place] during breathing treatment. Pt SpO2 100% during treatment. Pt remained on 6L via [place] after treatment, SpO2 100% for several minutes after treatment. SpO2 then fell to 86% on 6L. Pt placed on 15L via NRB. SpO2 94% at this time. RR 22. Rubin Payor, MD aware.

## 2021-07-29 NOTE — Assessment & Plan Note (Addendum)
.   SSI . Hemoglobin A1C: 5.9 . Diabetic Diet . Monitor closely while on steroids

## 2021-07-29 NOTE — ED Notes (Signed)
Pt removed from NRB to 4L via [place]. SpO2 94% on 4L via [place] at this time.

## 2021-07-30 ENCOUNTER — Inpatient Hospital Stay (HOSPITAL_COMMUNITY): Payer: Medicare Other

## 2021-07-30 DIAGNOSIS — J9621 Acute and chronic respiratory failure with hypoxia: Secondary | ICD-10-CM | POA: Diagnosis not present

## 2021-07-30 DIAGNOSIS — K219 Gastro-esophageal reflux disease without esophagitis: Secondary | ICD-10-CM | POA: Diagnosis present

## 2021-07-30 DIAGNOSIS — I5023 Acute on chronic systolic (congestive) heart failure: Secondary | ICD-10-CM | POA: Diagnosis not present

## 2021-07-30 LAB — GLUCOSE, CAPILLARY
Glucose-Capillary: 172 mg/dL — ABNORMAL HIGH (ref 70–99)
Glucose-Capillary: 237 mg/dL — ABNORMAL HIGH (ref 70–99)

## 2021-07-30 LAB — COMPREHENSIVE METABOLIC PANEL
ALT: 14 U/L (ref 0–44)
AST: 18 U/L (ref 15–41)
Albumin: 3.2 g/dL — ABNORMAL LOW (ref 3.5–5.0)
Alkaline Phosphatase: 89 U/L (ref 38–126)
Anion gap: 7 (ref 5–15)
BUN: 16 mg/dL (ref 8–23)
CO2: 30 mmol/L (ref 22–32)
Calcium: 8.5 mg/dL — ABNORMAL LOW (ref 8.9–10.3)
Chloride: 99 mmol/L (ref 98–111)
Creatinine, Ser: 0.85 mg/dL (ref 0.61–1.24)
GFR, Estimated: >60 mL/min (ref >60)
Glucose, Bld: 249 mg/dL — ABNORMAL HIGH (ref 70–99)
Potassium: 4.2 mmol/L (ref 3.5–5.1)
Sodium: 136 mmol/L (ref 135–145)
Total Bilirubin: 0.7 mg/dL (ref 0.3–1.2)
Total Protein: 6.7 g/dL (ref 6.5–8.1)

## 2021-07-30 LAB — CBC WITH DIFFERENTIAL/PLATELET
Abs Immature Granulocytes: 0.02 10*3/uL (ref 0.00–0.07)
Basophils Absolute: 0 10*3/uL (ref 0.0–0.1)
Basophils Relative: 0 %
Eosinophils Absolute: 0 10*3/uL (ref 0.0–0.5)
Eosinophils Relative: 0 %
HCT: 41.3 % (ref 39.0–52.0)
Hemoglobin: 13.9 g/dL (ref 13.0–17.0)
Immature Granulocytes: 1 %
Lymphocytes Relative: 34 %
Lymphs Abs: 1.3 10*3/uL (ref 0.7–4.0)
MCH: 31.4 pg (ref 26.0–34.0)
MCHC: 33.7 g/dL (ref 30.0–36.0)
MCV: 93.2 fL (ref 80.0–100.0)
Monocytes Absolute: 0.1 10*3/uL (ref 0.1–1.0)
Monocytes Relative: 2 %
Neutro Abs: 2.4 10*3/uL (ref 1.7–7.7)
Neutrophils Relative %: 63 %
Platelets: 196 10*3/uL (ref 150–400)
RBC: 4.43 MIL/uL (ref 4.22–5.81)
RDW: 13.1 % (ref 11.5–15.5)
WBC: 3.8 10*3/uL — ABNORMAL LOW (ref 4.0–10.5)
nRBC: 0 % (ref 0.0–0.2)

## 2021-07-30 LAB — ECHOCARDIOGRAM COMPLETE
Area-P 1/2: 4.26 cm2
Height: 69 in
S' Lateral: 3.6 cm
Single Plane A4C EF: 59.6 %
Weight: 3520 oz

## 2021-07-30 LAB — MAGNESIUM: Magnesium: 2.2 mg/dL (ref 1.7–2.4)

## 2021-07-30 LAB — CBG MONITORING, ED
Glucose-Capillary: 274 mg/dL — ABNORMAL HIGH (ref 70–99)
Glucose-Capillary: 232 mg/dL — ABNORMAL HIGH (ref 70–99)

## 2021-07-30 LAB — HEMOGLOBIN A1C
Hgb A1c MFr Bld: 5.9 % — ABNORMAL HIGH (ref 4.8–5.6)
Mean Plasma Glucose: 122.63 mg/dL

## 2021-07-30 MED ORDER — SODIUM CHLORIDE 0.9 % IV SOLN
100.0000 mg | Freq: Two times a day (BID) | INTRAVENOUS | Status: DC
  Administered 2021-07-30 – 2021-07-31 (×3): 100 mg via INTRAVENOUS
  Filled 2021-07-30 (×5): qty 100

## 2021-07-30 MED ORDER — IOHEXOL 350 MG/ML SOLN
80.0000 mL | Freq: Once | INTRAVENOUS | Status: AC | PRN
  Administered 2021-07-30: 80 mL via INTRAVENOUS

## 2021-07-30 MED ORDER — HYDRALAZINE HCL 20 MG/ML IJ SOLN: 10.0000 mg | Freq: Four times a day (QID) | INTRAMUSCULAR | Status: DC | PRN

## 2021-07-30 NOTE — Assessment & Plan Note (Addendum)
   Currently rate controlled  Not on anticoagulation in the outpatient setting due to history of intracranial hemorrhage  Continue home regimen of Coreg  Monitoring on telemetry

## 2021-07-30 NOTE — ED Notes (Signed)
Pt to CT

## 2021-07-30 NOTE — ED Notes (Signed)
Pt requested coffee (w/ sugar) and crackers.  RN convinced him that a diet sprite and 2 packs of crackers was a better option.  Pt started moving around in the bed and sat on the side of the bed to urinate, he became very SOB, O2 dropped to 85%.  Once RN got him settled in the bed his O2 started to go back up.  Respiratory aware.

## 2021-07-30 NOTE — Assessment & Plan Note (Addendum)
.   Resume patients home regimen or oral antihypertensives . Titrate antihypertensive regimen as necessary to achieve adequate BP control

## 2021-07-30 NOTE — ED Notes (Signed)
RN called respiratory per provider's request for flutter valve and breathing treatment

## 2021-07-30 NOTE — Progress Notes (Signed)
  Echocardiogram 2D Echocardiogram has been performed.  Pieter Partridge 07/30/2021, 11:10 AM

## 2021-07-30 NOTE — ED Notes (Signed)
Walked patient to the bathroom patient did well patient is now back in bed on the monitor  

## 2021-07-30 NOTE — Progress Notes (Signed)
Progress Note    Bradley [name]   [id number]  DOB: 1940-10-30  DOA: 07/29/2021     1 Date of Service: 07/30/2021   Clinical Course 80 year old male with past medical history of chronic atrial fibrillation not on anticoagulation (S/P ablation placement of CRT-P in 2012), history of intracranial hemorrhage, systolic congestive heart failure  (EF as low as 35-40% 2012, now 55-60% 08/2020), COPD, chronic respiratory failure on 2 L of oxygen via nasal cannula (per pulm note 03/2021), peripheral artery disease, pulmonary hypertension, gastroesophageal reflux disease, hypertension, hyperlipidemia, anxiety disorder, benign prostatic hyperplasia and diabetes mellitus type 2 who presents to [facility] emergency department via EMS with complaints of shortness of breath.    Assessment and Plan * Acute on chronic respiratory failure with hypoxia (HCC) Complicated picture with a 1 week history of progressively worsening shortness of breath and cough ABG consistent with acute hypoxic respiratory failure with PaO2 of 53.1 on 12 L of [place]ter high flow nasal cannula On chest x-ray does show some evidence of patchy bilateral infiltrates that could possibly be pulmonary edema with notable significant increase in weight by approximately 12 kg over the past month physical exam is not overwhelmingly compelling for acute congestive heart failure. Due to somewhat unclear picture treating patient both for COPD exacerbation with steroids and aggressive bronchodilator therapy in addition to intravenous diuretics for now. CT Scan negative for PE   GERD without esophagitis Continuing home regimen of daily PPI therapy.   Acute on chronic systolic CHF (congestive heart failure) (HCC) With patchy infiltrates on chest x-ray and documented increasing white blood pressure troponins over the past month congestive heart failure is at least in part contributing to patient's current acute hypoxic respiratory  failure. Intravenous diuretics- trend Cr closely Strict input and output monitoring Daily weights  COPD with acute exacerbation (HCC) COPD exacerbation likely contributing. Aggressive bronchodilator therapy with both scheduled and as needed nebulized treatments Systemic steroids with Solu-Medrol intravenously every 12 hours   Chronic atrial fibrillation (HCC) Currently rate controlled in a paced rhythm Not on anticoagulation in the outpatient setting due to history of intracranial hemorrhage Continue home regimen of Coreg Monitoring on telemetry   Essential hypertension Resume patients home regimen or oral antihypertensives Titrate antihypertensive regimen as necessary to achieve adequate BP control   Mixed diabetic hyperlipidemia associated with type 2 diabetes mellitus (HCC) Continuing home regimen of lipid lowering therapy.   Type 2 diabetes mellitus without complication, without long-term current use of insulin (HCC) SSI Hemoglobin A1C: 5.9 Diabetic Diet     Subjective:  Breathing slightly better  Objective Vitals:   07/30/21 0521 07/30/21 0600 07/30/21 0700 07/30/21 0852  BP:  115/67 115/67 (!) 122/59  Pulse: 69 70 70 70  Resp: (!) 23 (!) 23 20 (!) 24  Temp:      TempSrc:      SpO2: 93% 93% 97% 94%  Weight:      Height:       99.8 kg     Exam  General: Appearance:    Obese male in no acute distress     Lungs:     respirations unlabored  Heart:    Normal heart rate.   MS:   All extremities are intact.    Neurologic:   Awake, alert, oriented x 3. No apparent focal neurological           defect.      Labs / Other Information My review of labs, imaging, notes and  other tests shows no new significant findings.    Disposition Plan: Status is: Inpatient  Remains inpatient appropriate because: increased O2 requirement       Time spent: 35  minutes [facility] 07/30/2021, 11:45 AM

## 2021-07-30 NOTE — Assessment & Plan Note (Signed)
Continuing home regimen of daily PPI therapy.  

## 2021-07-30 NOTE — Plan of Care (Signed)
  Problem: Clinical Measurements: Goal: Respiratory complications will improve Outcome: Progressing Goal: Cardiovascular complication will be avoided Outcome: Progressing   Problem: Elimination: Goal: Will not experience complications related to bowel motility Outcome: Progressing Goal: Will not experience complications related to urinary retention Outcome: Progressing   Problem: Education: Goal: Ability to demonstrate management of disease process will improve Outcome: Progressing Goal: Ability to verbalize understanding of medication therapies will improve Outcome: Progressing Goal: Individualized Educational Video(s) Outcome: Progressing   Problem: Activity: Goal: Capacity to carry out activities will improve Outcome: Progressing   Problem: Cardiac: Goal: Ability to achieve and maintain adequate cardiopulmonary perfusion will improve Outcome: Progressing

## 2021-07-30 NOTE — ED Notes (Signed)
Checked patient blood sugar it was 274 notified RN of blood sugar patient is resting with call bell in reach

## 2021-07-31 DIAGNOSIS — J9621 Acute and chronic respiratory failure with hypoxia: Secondary | ICD-10-CM | POA: Diagnosis not present

## 2021-07-31 LAB — BASIC METABOLIC PANEL
Anion gap: 6 (ref 5–15)
BUN: 18 mg/dL (ref 8–23)
CO2: 34 mmol/L — ABNORMAL HIGH (ref 22–32)
Calcium: 8.6 mg/dL — ABNORMAL LOW (ref 8.9–10.3)
Chloride: 98 mmol/L (ref 98–111)
Creatinine, Ser: 0.86 mg/dL (ref 0.61–1.24)
GFR, Estimated: >60 mL/min (ref >60)
Glucose, Bld: 130 mg/dL — ABNORMAL HIGH (ref 70–99)
Potassium: 3.9 mmol/L (ref 3.5–5.1)
Sodium: 138 mmol/L (ref 135–145)

## 2021-07-31 LAB — GLUCOSE, CAPILLARY
Glucose-Capillary: 135 mg/dL — ABNORMAL HIGH (ref 70–99)
Glucose-Capillary: 151 mg/dL — ABNORMAL HIGH (ref 70–99)
Glucose-Capillary: 242 mg/dL — ABNORMAL HIGH (ref 70–99)
Glucose-Capillary: 237 mg/dL — ABNORMAL HIGH (ref 70–99)

## 2021-07-31 MED ORDER — GUAIFENESIN-DM 100-10 MG/5ML PO SYRP
5.0000 mL | ORAL_SOLUTION | ORAL | Status: DC | PRN
  Administered 2021-07-31 – 2021-08-01 (×3): 5 mL via ORAL
  Filled 2021-07-31 (×3): qty 5

## 2021-07-31 MED ORDER — ORAL CARE MOUTH RINSE
15.0000 mL | Freq: Two times a day (BID) | OROMUCOSAL | Status: DC
  Administered 2021-08-01 – 2021-08-03 (×3): 15 mL via OROMUCOSAL

## 2021-07-31 MED ORDER — DOXYCYCLINE HYCLATE 100 MG PO TABS
100.0000 mg | ORAL_TABLET | Freq: Two times a day (BID) | ORAL | Status: DC
  Administered 2021-07-31 – 2021-08-03 (×6): 100 mg via ORAL
  Filled 2021-07-31 (×6): qty 1

## 2021-07-31 MED ORDER — LOSARTAN POTASSIUM 25 MG PO TABS
25.0000 mg | ORAL_TABLET | Freq: Every day | ORAL | Status: DC
  Administered 2021-08-01 – 2021-08-03 (×3): 25 mg via ORAL
  Filled 2021-07-31 (×3): qty 1

## 2021-07-31 MED ORDER — LORATADINE 10 MG PO TABS
10.0000 mg | ORAL_TABLET | Freq: Every day | ORAL | Status: DC
  Administered 2021-07-31 – 2021-08-03 (×4): 10 mg via ORAL
  Filled 2021-07-31 (×4): qty 1

## 2021-07-31 MED ORDER — AMLODIPINE BESYLATE 5 MG PO TABS
5.0000 mg | ORAL_TABLET | Freq: Every day | ORAL | Status: DC
  Administered 2021-08-01 – 2021-08-03 (×3): 5 mg via ORAL
  Filled 2021-07-31 (×3): qty 1

## 2021-07-31 NOTE — Progress Notes (Signed)
   07/30/21 2024  Assess: MEWS Score  Temp (!) 96.6 F (35.9 C)  BP 118/66  Pulse Rate 69  ECG Heart Rate 70  Resp (!) 25  SpO2 96 %  O2 Device HFNC  O2 Flow Rate (L/min) 12 L/min  Assess: MEWS Score  MEWS Temp 1  MEWS Systolic 0  MEWS Pulse 0  MEWS RR 1  MEWS LOC 0  MEWS Score 2  MEWS Score Color Yellow  Assess: if the MEWS score is Yellow or Red  Were vital signs taken at a resting state? Yes  Focused Assessment No change from prior assessment  Early Detection of Sepsis Score *See Row Information* Low  MEWS guidelines implemented *See Row Information* No, previously yellow, continue vital signs every 4 hours  Document  Patient Outcome Stabilized after interventions  Progress note created (see row info) Yes

## 2021-07-31 NOTE — Plan of Care (Signed)
  Problem: Education: Goal: Knowledge of General Education information will improve Description: Including pain rating scale, medication(s)/side effects and non-pharmacologic comfort measures Outcome: Progressing   Problem: Clinical Measurements: Goal: Ability to maintain clinical measurements within normal limits will improve Outcome: Progressing   

## 2021-07-31 NOTE — Plan of Care (Signed)
  Problem: Education: Goal: Knowledge of General Education information will improve Description Including pain rating scale, medication(s)/side effects and non-pharmacologic comfort measures Outcome: Progressing   Problem: Health Behavior/Discharge Planning: Goal: Ability to manage health-related needs will improve Outcome: Progressing   

## 2021-07-31 NOTE — Evaluation (Signed)
Physical Therapy Evaluation/Discharge Patient Details Name: Bradley [name]. MRN: [id number] DOB: Aug 18, 1941 Today's Date: 07/31/2021  History of Present Illness  Pt is an 80 y.o. male who presented 07/29/21 with SOB. Chest x-ray does show some evidence of patchy bilateral infiltrates that could possibly be pulmonary edema. CT Scan negative for PE. PMH: chronic atrial fibrillation not on anticoagulation (S/P ablation placement of CRT-P in 2012), hx of intracranial hemorrhage, systolic CHF (EF as low as 35-40% 2012, now 55-60% 08/2020), COPD, chronic respiratory failure on 2 L of O2 via [place] (per pulm note 03/2021), PAD, pulmonary HTN, GERD, HTN, hyperlipidemia, anxiety disorder, benign prostatic hyperplasia, DM2   Clinical Impression  Pt presents with condition above. PTA, he was independent without AD and living with his wife, who is disabled, in a townhouse with multiple levels. The kitchen is on the 2nd level, which is accessible with a stair lift if needed. Pt is on 2-3L O2 at baseline. Currently, pt reports and appears to be functioning at his baseline, not needing any physical assistance or UE support or displaying any bouts of LOB with all functional mobility. Pt's limitation is his increased supplemental O2 dependence. However, pt was able to be weaned from 8L upon entry to 4L at rest by end of session, maintaining SpO2 >/= 95%. While ambulating, he was able to maintain SpO2 >/= 94% on 6L. As pt is at his baseline and not displaying any significant functional mobility safety concerns, no further PT needs were identified. All education completed and questions answered. Encouraged pt to mobilize often with nursing and mobility specialists. PT will sign off.     Recommendations for follow up therapy are one component of a multi-disciplinary discharge planning process, led by the attending physician.  Recommendations may be updated based on patient status, additional functional criteria and insurance  authorization.  Follow Up Recommendations No PT follow up    Equipment Recommendations  None recommended by PT    Recommendations for Other Services       Precautions / Restrictions Precautions Precautions: Other (comment) Precaution Comments: monitor SpO2 Restrictions Weight Bearing Restrictions: No      Mobility  Bed Mobility Overal bed mobility: Modified Independent             General bed mobility comments: Pt performs all bed mobility aspects without assistance.    Transfers Overall transfer level: Independent Equipment used: None             General transfer comment: Pt is able to come to stand without safety concerns.  Ambulation/Gait Ambulation/Gait assistance: Supervision Gait Distance (Feet): 160 Feet Assistive device: None Gait Pattern/deviations: Step-through pattern;Decreased stride length;Narrow base of support Gait velocity: reduced Gait velocity interpretation: 1.31 - 2.62 ft/sec, indicative of limited community ambulator General Gait Details: Pt with short bil steps and slightly wide stance. Slow speed. Pt reports this is his baseline. No LOB, even when changing head positions, but tends to look inferiorly. Supervision for safety.  Stairs            Wheelchair Mobility    Modified Rankin (Stroke Patients Only)       Balance Overall balance assessment: Independent                                           Pertinent Vitals/Pain Pain Assessment: Faces Faces Pain Scale: Hurts a little bit Pain Location: grimacing  with coughing Pain Descriptors / Indicators: Grimacing Pain Intervention(s): Limited activity within patient's tolerance;Monitored during session;Repositioned    Home Living Family/patient expects to be discharged to:: Private residence Living Arrangements: Spouse/significant other Available Help at Discharge: Family;Available 24 hours/day (wife, but she is not physically able to help) Type of  Home: House Home Access: Stairs to enter Entrance Stairs-Rails: None Entrance Stairs-Number of Steps: 1 Home Layout: Multi-level Home Equipment: Cane - single point;Shower seat - built in;Grab bars - tub/shower      Prior Function Level of Independence: Independent         Comments: on 2-3L O2 at baseline     Hand Dominance   Dominant Hand: Right    Extremity/Trunk Assessment   Upper Extremity Assessment Upper Extremity Assessment: Overall WFL for tasks assessed (MMT score of 4+ to 5 grossly; denies numbness/tingling)    Lower Extremity Assessment Lower Extremity Assessment: Generalized weakness (MMT scores of 4- hip flexion bil, 4 knee extension bil; denies numbness/tingling)    Cervical / Trunk Assessment Cervical / Trunk Assessment: Kyphotic  Communication   Communication: No difficulties  Cognition Arousal/Alertness: Awake/alert Behavior During Therapy: WFL for tasks assessed/performed Overall Cognitive Status: Within Functional Limits for tasks assessed                                        General Comments General comments (skin integrity, edema, etc.): SpO2 >/= 94% on 6L during gait, >/= 95% on 4L at rest kept pt on 4L end of session supine; HR varying greatly from 80s up to max of 181 for brief moment before decreasing to 110s again, RN aware; educated pt on lower extremity exercises to address weakness, which reports is his baseline also; educated pt to mobilize regularly with staff    Exercises     Assessment/Plan    PT Assessment Patent does not need any further PT services  PT Problem List         PT Treatment Interventions      PT Goals (Current goals can be found in the Care Plan section)  Acute Rehab PT Goals Patient Stated Goal: to get his O2 back to normal PT Goal Formulation: All assessment and education complete, DC therapy Time For Goal Achievement: 08/01/21 Potential to Achieve Goals: Good    Frequency      Barriers to discharge        Co-evaluation               AM-PAC PT "6 Clicks" Mobility  Outcome Measure Help needed turning from your back to your side while in a flat bed without using bedrails?: None Help needed moving from lying on your back to sitting on the side of a flat bed without using bedrails?: None Help needed moving to and from a bed to a chair (including a wheelchair)?: None Help needed standing up from a chair using your arms (e.g., wheelchair or bedside chair)?: None Help needed to walk in hospital room?: A Little Help needed climbing 3-5 steps with a railing? : A Little 6 Click Score: 22    End of Session Equipment Utilized During Treatment: Oxygen Activity Tolerance: Patient tolerated treatment well Patient left: in bed;with call bell/phone within reach Nurse Communication: Mobility status;Other (comment) (vitals) PT Visit Diagnosis: Other abnormalities of gait and mobility (R26.89);Muscle weakness (generalized) (M62.81)    Time: 8811-0315 PT Time Calculation (min) (ACUTE ONLY): 15  min   Charges:   PT Evaluation $PT Eval Low Complexity: 1 Low          Raymond Gurney, PT, DPT Acute Rehabilitation Services  Pager: [phone] Office: [phone]   Jewel Baize 07/31/2021, 1:22 PM

## 2021-07-31 NOTE — Plan of Care (Signed)
  Problem: Skin Integrity: Goal: Risk for impaired skin integrity will decrease Outcome: Completed/Met   Problem: Safety: Goal: Ability to remain free from injury will improve Outcome: Completed/Met   Problem: Pain Managment: Goal: General experience of comfort will improve Outcome: Completed/Met   Problem: Elimination: Goal: Will not experience complications related to urinary retention Outcome: Completed/Met   Problem: Coping: Goal: Level of anxiety will decrease Outcome: Completed/Met   Problem: Nutrition: Goal: Adequate nutrition will be maintained Outcome: Completed/Met

## 2021-07-31 NOTE — Progress Notes (Signed)
   07/31/21 1123  Mobility  Activity Refused mobility (Patient declined wants to save energy for ambulation later.)

## 2021-07-31 NOTE — Progress Notes (Signed)
Progress Note    Bradley [name]   [id number]  DOB: 10/05/1941  DOA: 07/29/2021     2 Date of Service: 07/31/2021    Clinical Course 80 year old male with past medical history of chronic atrial fibrillation not on anticoagulation (S/P ablation placement of CRT-P in 2012), history of intracranial hemorrhage, systolic congestive heart failure  (EF as low as 35-40% 2012, now 55-60% 08/2020), COPD, chronic respiratory failure on 2 L of oxygen via nasal cannula (per pulm note 03/2021), peripheral artery disease, pulmonary hypertension, gastroesophageal reflux disease, hypertension, hyperlipidemia, anxiety disorder, benign prostatic hyperplasia and diabetes mellitus type 2 who presents to [facility] emergency department via EMS with complaints of shortness of breath.   Being treated for possible COPD/CHF exacerbation.      Assessment and Plan * Acute on chronic respiratory failure with hypoxia (HCC) ABG consistent with acute hypoxic respiratory failure with PaO2 of 53.1 on 12 L of [place]ter high flow nasal cannula  chest x-ray showed some evidence of patchy bilateral infiltrates that could possibly be pulmonary edema with notable significant increase in weight by approximately 12 kg over the past month Due to somewhat unclear picture treating patient both for COPD exacerbation with steroids and aggressive bronchodilator therapy in addition to intravenous diuretics for now. CT Scan negative for PE   GERD without esophagitis Continuing home regimen of daily PPI therapy.   Acute on chronic systolic CHF (congestive heart failure) (HCC) With patchy infiltrates on chest x-ray and documented increasing white blood pressure troponins over the past month congestive heart failure is at least in part contributing to patient's current acute hypoxic respiratory failure. Intravenous diuretics- trend Cr closely Strict input and output monitoring Daily weights (down 40lbs if weights are  accurate)  COPD with acute exacerbation (HCC) COPD exacerbation likely contributing. Aggressive bronchodilator therapy with both scheduled and as needed nebulized treatments Systemic steroids with Solu-Medrol-- wean as able (today still very wheezy)   Chronic atrial fibrillation (HCC) Currently rate controlled Not on anticoagulation in the outpatient setting due to history of intracranial hemorrhage Continue home regimen of Coreg Monitoring on telemetry   Essential hypertension Resume patients home regimen or oral antihypertensives Titrate antihypertensive regimen as necessary to achieve adequate BP control   Mixed diabetic hyperlipidemia associated with type 2 diabetes mellitus (HCC) Continuing home regimen of lipid lowering therapy.   Type 2 diabetes mellitus without complication, without long-term current use of insulin (HCC) SSI Hemoglobin A1C: 5.9 Diabetic Diet Monitor closely while on steroids     Subjective:  Feels like he gets a COPD exacerbation  Objective Vitals:   07/31/21 0737 07/31/21 0805 07/31/21 0811 07/31/21 1143  BP: 123/72 (!) 114/59  112/65  Pulse: 71   69  Resp: 20   (!) 21  Temp: 97.7 F (36.5 C)   97.6 F (36.4 C)  TempSrc: Oral   Oral  SpO2: 98%  100% 99%  Weight:      Height:       82.7 kg     Exam  General: Appearance:     Overweight male in no acute distress     Lungs:     On HFNC that is being weaned by nursing/respiratory, wheezing and rales throughout, respirations unlabored  Heart:    Normal heart rate.   MS:   All extremities are intact.    Neurologic:   Awake, alert, oriented x 3. No apparent focal neurological           defect.  Labs / Other Information My review of labs, imaging, notes and other tests shows no new significant findings.    Disposition Plan: Status is: Inpatient  Remains inpatient appropriate because: IV steroids/IV lasix       Time spent: 35 minutes [facility] 07/31/2021,  12:33 PM

## 2021-08-01 DIAGNOSIS — J9621 Acute and chronic respiratory failure with hypoxia: Secondary | ICD-10-CM | POA: Diagnosis not present

## 2021-08-01 LAB — BASIC METABOLIC PANEL
Anion gap: 3 — ABNORMAL LOW (ref 5–15)
BUN: 25 mg/dL — ABNORMAL HIGH (ref 8–23)
CO2: 35 mmol/L — ABNORMAL HIGH (ref 22–32)
Calcium: 8.4 mg/dL — ABNORMAL LOW (ref 8.9–10.3)
Chloride: 99 mmol/L (ref 98–111)
Creatinine, Ser: 0.85 mg/dL (ref 0.61–1.24)
GFR, Estimated: >60 mL/min (ref >60)
Glucose, Bld: 130 mg/dL — ABNORMAL HIGH (ref 70–99)
Potassium: 3.8 mmol/L (ref 3.5–5.1)
Sodium: 137 mmol/L (ref 135–145)

## 2021-08-01 LAB — CBC
HCT: 36.4 % — ABNORMAL LOW (ref 39.0–52.0)
Hemoglobin: 12.3 g/dL — ABNORMAL LOW (ref 13.0–17.0)
MCH: 30.8 pg (ref 26.0–34.0)
MCHC: 33.8 g/dL (ref 30.0–36.0)
MCV: 91.2 fL (ref 80.0–100.0)
Platelets: 201 10*3/uL (ref 150–400)
RBC: 3.99 MIL/uL — ABNORMAL LOW (ref 4.22–5.81)
RDW: 13 % (ref 11.5–15.5)
WBC: 10.8 10*3/uL — ABNORMAL HIGH (ref 4.0–10.5)
nRBC: 0 % (ref 0.0–0.2)

## 2021-08-01 LAB — GLUCOSE, CAPILLARY
Glucose-Capillary: 148 mg/dL — ABNORMAL HIGH (ref 70–99)
Glucose-Capillary: 151 mg/dL — ABNORMAL HIGH (ref 70–99)
Glucose-Capillary: 317 mg/dL — ABNORMAL HIGH (ref 70–99)
Glucose-Capillary: 247 mg/dL — ABNORMAL HIGH (ref 70–99)

## 2021-08-01 MED ORDER — GUAIFENESIN ER 600 MG PO TB12
600.0000 mg | ORAL_TABLET | Freq: Two times a day (BID) | ORAL | Status: DC
  Administered 2021-08-01 – 2021-08-03 (×5): 600 mg via ORAL
  Filled 2021-08-01 (×5): qty 1

## 2021-08-01 MED ORDER — FUROSEMIDE 40 MG PO TABS
80.0000 mg | ORAL_TABLET | ORAL | Status: DC
  Administered 2021-08-02: 80 mg via ORAL
  Filled 2021-08-01 (×2): qty 2

## 2021-08-01 MED ORDER — ENOXAPARIN SODIUM 40 MG/0.4ML IJ SOSY
40.0000 mg | PREFILLED_SYRINGE | INTRAMUSCULAR | Status: DC
  Administered 2021-08-01 – 2021-08-02 (×2): 40 mg via SUBCUTANEOUS
  Filled 2021-08-01 (×2): qty 0.4

## 2021-08-01 MED ORDER — IPRATROPIUM-ALBUTEROL 0.5-2.5 (3) MG/3ML IN SOLN
3.0000 mL | Freq: Three times a day (TID) | RESPIRATORY_TRACT | Status: DC
  Administered 2021-08-01 – 2021-08-03 (×7): 3 mL via RESPIRATORY_TRACT
  Filled 2021-08-01 (×8): qty 3

## 2021-08-01 MED ORDER — METHYLPREDNISOLONE SODIUM SUCC 125 MG IJ SOLR
60.0000 mg | Freq: Every day | INTRAMUSCULAR | Status: AC
  Administered 2021-08-02: 60 mg via INTRAVENOUS
  Filled 2021-08-01: qty 2

## 2021-08-01 MED ORDER — FUROSEMIDE 40 MG PO TABS
40.0000 mg | ORAL_TABLET | ORAL | Status: DC
  Administered 2021-08-02: 40 mg via ORAL
  Filled 2021-08-01: qty 1

## 2021-08-01 MED ORDER — FUROSEMIDE 40 MG PO TABS: 40.0000 mg | ORAL_TABLET | ORAL | Status: DC

## 2021-08-01 NOTE — Plan of Care (Signed)
  Problem: Education: Goal: Knowledge of General Education information will improve Description: Including pain rating scale, medication(s)/side effects and non-pharmacologic comfort measures Outcome: Progressing   Problem: Clinical Measurements: Goal: Ability to maintain clinical measurements within normal limits will improve Outcome: Progressing   

## 2021-08-01 NOTE — Progress Notes (Signed)
Progress Note    Bradley [name]   [id number]:[id number]  DOB: [birth year]  DOA: 07/29/2021     3 Date of Service: 08/01/2021   Clinical Course 80 year old male with past medical history of chronic atrial fibrillation not on anticoagulation (S/P ablation placement of CRT-P in 2012), history of intracranial hemorrhage, systolic congestive heart failure  (EF as low as 35-40% 2012, now 55-60% 08/2020), COPD, chronic respiratory failure on 2 L of oxygen via nasal cannula (per pulm note 03/2021), peripheral artery disease, pulmonary hypertension, gastroesophageal reflux disease, hypertension, hyperlipidemia, anxiety disorder, benign prostatic hyperplasia and diabetes mellitus type 2 who presents to [facility] emergency department via EMS with complaints of shortness of breath.   Being treated for possible COPD/CHF exacerbation.      Assessment and Plan * Acute on chronic respiratory failure with hypoxia (HCC) ABG consistent with acute hypoxic respiratory failure with PaO2 of 53.1 on 12 L of [place]ter high flow nasal cannula  chest x-ray showed some evidence of patchy bilateral infiltrates that could possibly be pulmonary edema with notable significant increase in weight by approximately 12 kg over the past month Due to somewhat unclear picture treating patient both for COPD exacerbation with steroids and aggressive bronchodilator therapy in addition to intravenous diuretics for now. CT Scan negative for PE  Wean down to 2L  GERD without esophagitis Continuing home regimen of daily PPI therapy.   Acute on chronic systolic CHF (congestive heart failure) (HCC) With patchy infiltrates on chest x-ray and documented increasing white blood pressure troponins over the past month congestive heart failure is at least in part contributing to patient's current acute hypoxic respiratory failure. Intravenous diuretics- trend Cr closely Strict input and output monitoring Daily weights (down 40lbs if  weights are accurate) D/c IV lasix as suspect he has diuresed enough  COPD with acute exacerbation (HCC) Aggressive bronchodilator therapy with both scheduled and as needed nebulized treatments Systemic steroids with Solu-Medrol-- lungs improved on 10/22  Chronic atrial fibrillation (HCC) Currently rate controlled Not on anticoagulation in the outpatient setting due to history of intracranial hemorrhage Continue home regimen of Coreg Monitoring on telemetry   Essential hypertension Resume patients home regimen or oral antihypertensives Titrate antihypertensive regimen as necessary to achieve adequate BP control   Mixed diabetic hyperlipidemia associated with type 2 diabetes mellitus (HCC) Continuing home regimen of lipid lowering therapy.   Type 2 diabetes mellitus without complication, without long-term current use of insulin (HCC) SSI Hemoglobin A1C: 5.9 Diabetic Diet Monitor closely while on steroids     Subjective:  Feels like his breathing is better  Objective Vitals:   08/01/21 0700 08/01/21 0924 08/01/21 1140 08/01/21 1230  BP: 119/64 118/63  (!) 107/57  Pulse: 71   70  Resp: 20     Temp: 97.6 F (36.4 C)   (!) 97.5 F (36.4 C)  TempSrc: Axillary   Axillary  SpO2: 100%  98% 93%  Weight:      Height:       82.6 kg     Exam  General: Appearance:     Overweight male in no acute distress     Lungs:     Moving more air, less wheezing, respirations unlabored  Heart:    Normal heart rate.   MS:   All extremities are intact.    Neurologic:   Awake, alert, oriented x 3.      Labs / Other Information My review of labs, imaging, notes and other tests shows no  new significant findings.    Disposition Plan: Status is: Inpatient  Remains inpatient appropriate because: slowly improving, home in 48 hours       Time spent: 35 minutes [facility] 08/01/2021, 12:58 PM

## 2021-08-02 DIAGNOSIS — J9621 Acute and chronic respiratory failure with hypoxia: Secondary | ICD-10-CM | POA: Diagnosis not present

## 2021-08-02 LAB — GLUCOSE, CAPILLARY
Glucose-Capillary: 149 mg/dL — ABNORMAL HIGH (ref 70–99)
Glucose-Capillary: 159 mg/dL — ABNORMAL HIGH (ref 70–99)
Glucose-Capillary: 222 mg/dL — ABNORMAL HIGH (ref 70–99)
Glucose-Capillary: 323 mg/dL — ABNORMAL HIGH (ref 70–99)

## 2021-08-02 LAB — BASIC METABOLIC PANEL
Anion gap: 6 (ref 5–15)
BUN: 18 mg/dL (ref 8–23)
CO2: 36 mmol/L — ABNORMAL HIGH (ref 22–32)
Calcium: 8.3 mg/dL — ABNORMAL LOW (ref 8.9–10.3)
Chloride: 96 mmol/L — ABNORMAL LOW (ref 98–111)
Creatinine, Ser: 0.75 mg/dL (ref 0.61–1.24)
GFR, Estimated: >60 mL/min (ref >60)
Glucose, Bld: 134 mg/dL — ABNORMAL HIGH (ref 70–99)
Potassium: 3.6 mmol/L (ref 3.5–5.1)
Sodium: 138 mmol/L (ref 135–145)

## 2021-08-02 MED ORDER — PREDNISONE 50 MG PO TABS
50.0000 mg | ORAL_TABLET | Freq: Every day | ORAL | Status: DC
  Administered 2021-08-03: 50 mg via ORAL
  Filled 2021-08-02: qty 1

## 2021-08-02 MED ORDER — ACETAZOLAMIDE 250 MG PO TABS
500.0000 mg | ORAL_TABLET | Freq: Two times a day (BID) | ORAL | Status: DC
  Administered 2021-08-02 (×2): 500 mg via ORAL
  Filled 2021-08-02 (×4): qty 2

## 2021-08-02 NOTE — Progress Notes (Signed)
Progress Note    Bradley [name]   GUR:[id number]  DOB: 1941-03-28  DOA: 07/29/2021     4 Date of Service: 08/02/2021   Clinical Course 80 year old male with past medical history of chronic atrial fibrillation not on anticoagulation (S/P ablation placement of CRT-P in 2012), history of intracranial hemorrhage, systolic congestive heart failure  (EF as low as 35-40% 2012, now 55-60% 08/2020), COPD, chronic respiratory failure on 2 L of oxygen via nasal cannula (per pulm note 03/2021), peripheral artery disease, pulmonary hypertension, gastroesophageal reflux disease, hypertension, hyperlipidemia, anxiety disorder, benign prostatic hyperplasia and diabetes mellitus type 2 who presents to [facility] emergency department via EMS with complaints of shortness of breath.   Being treated for possible COPD/CHF exacerbation.    Assessment and Plan * Acute on chronic respiratory failure with hypoxia (HCC) ABG consistent with acute hypoxic respiratory failure with PaO2 of 53.1 on 12 L of salter high flow nasal cannula  chest x-ray showed some evidence of patchy bilateral infiltrates that could possibly be pulmonary edema with notable significant increase in weight by approximately 12 kg over the past month Due to somewhat unclear picture treating patient both for COPD exacerbation with steroids and aggressive bronchodilator therapy in addition to intravenous diuretics for now. CT Scan negative for PE -sats ok 90-92% if no increased work of breathing/symptoms Wean down to 2L  GERD without esophagitis Continuing home regimen of daily PPI therapy.   Acute on chronic systolic CHF (congestive heart failure) (HCC) With patchy infiltrates on chest x-ray and documented increasing white blood pressure troponins over the past month congestive heart failure is at least in part contributing to patient's current acute hypoxic respiratory failure. Intravenous diuretics- trend Cr closely Strict input and  output monitoring Daily weights (down 40lbs if weights are accurate) D/c IV lasix as suspect he has diuresed enough  COPD with acute exacerbation (HCC) Aggressive bronchodilator therapy with both scheduled and as needed nebulized treatments Systemic steroids with Solu-Medrol-- lungs improved on 10/22-wean to PO for the AM  Chronic atrial fibrillation (HCC) Currently rate controlled Not on anticoagulation in the outpatient setting due to history of intracranial hemorrhage Continue home regimen of Coreg Monitoring on telemetry   Essential hypertension Resume patients home regimen or oral antihypertensives Titrate antihypertensive regimen as necessary to achieve adequate BP control   Mixed diabetic hyperlipidemia associated with type 2 diabetes mellitus (HCC) Continuing home regimen of lipid lowering therapy.   Type 2 diabetes mellitus without complication, without long-term current use of insulin (HCC) SSI Hemoglobin A1C: 5.9 Diabetic Diet Monitor closely while on steroids     Subjective:  Almost back to baseline- would like to walk more  Objective Vitals:   08/01/21 2005 08/02/21 0346 08/02/21 0755 08/02/21 0908  BP: 128/77 115/75    Pulse: 76 71    Resp: (!) 24 (!) 22    Temp: (!) 97.3 F (36.3 C) 97.6 F (36.4 C)  97.7 F (36.5 C)  TempSrc: Oral Oral  Oral  SpO2: 96% 97% 98%   Weight:  82.7 kg    Height:       82.7 kg     Exam  General: Appearance:     Overweight male in no acute distress     Lungs:      respirations unlabored, moving more air, less wheezing  Heart:    Normal heart rate.   MS:   All extremities are intact.    Neurologic:   Awake, alert, oriented x 3.  Labs / Other Information My review of labs, imaging, notes and other tests is significant for mild increase in CO2     Disposition Plan: Status is: Inpatient  Remains inpatient appropriate because: home in AM once off IV steroids       Time spent: 35 minutes [facility] 08/02/2021, 10:22 AM

## 2021-08-03 DIAGNOSIS — J9621 Acute and chronic respiratory failure with hypoxia: Secondary | ICD-10-CM | POA: Diagnosis not present

## 2021-08-03 LAB — GLUCOSE, CAPILLARY: Glucose-Capillary: 106 mg/dL — ABNORMAL HIGH (ref 70–99)

## 2021-08-03 MED ORDER — AMLODIPINE BESYLATE 10 MG PO TABS: 5.0000 mg | ORAL_TABLET | Freq: Every day | ORAL | 1 refills | Status: DC

## 2021-08-03 MED ORDER — DOXYCYCLINE HYCLATE 100 MG PO TABS: 100.0000 mg | ORAL_TABLET | Freq: Two times a day (BID) | ORAL | 0 refills | Status: DC

## 2021-08-03 MED ORDER — GUAIFENESIN ER 600 MG PO TB12: 600.0000 mg | ORAL_TABLET | Freq: Two times a day (BID) | ORAL | Status: DC

## 2021-08-03 MED ORDER — LOSARTAN POTASSIUM 50 MG PO TABS: 25.0000 mg | ORAL_TABLET | Freq: Every day | ORAL | 1 refills | Status: DC

## 2021-08-03 MED ORDER — PREDNISONE 50 MG PO TABS: 50.0000 mg | ORAL_TABLET | Freq: Every day | ORAL | 0 refills | Status: AC

## 2021-08-03 MED ORDER — LORATADINE 10 MG PO TABS: 10.0000 mg | ORAL_TABLET | Freq: Every day | ORAL | Status: DC

## 2021-08-03 NOTE — TOC Initial Note (Signed)
Transition of Care (TOC) - Initial/Assessment Note    Patient Details  Name: Bradley PARKERSON Sr. MRN: [id number] Date of Birth: [birth year]  [facility]) CM/SW Contact:    Leone Haven, RN Phone Number: 08/03/2021, 9:12 AM  Clinical Narrative:                 Patient is for dc today, he lives with his wife and dog at home. He is indep, still drives. His sister n law will transport him home today. He has no issues with getting his medications or taking them. He has a scale at home and weighs himself daily, he also checks his bp.  He states he eats a low sodium diet and his wife makes sure of that.  His sister n law will bring his oxygent tank to transport him home today.   Expected Discharge Plan: Home/Self Care Barriers to Discharge: No Barriers Identified   Patient Goals and CMS Choice Patient states their goals for this hospitalization and ongoing recovery are:: return home   Choice offered to / list presented to : NA  Expected Discharge Plan and Services Expected Discharge Plan: Home/Self Care   Discharge Planning Services: CM Consult   Living arrangements for the past 2 months: Single Family Home Expected Discharge Date: 08/03/21                         [facility] Arranged: NA          Prior Living Arrangements/Services Living arrangements for the past 2 months: Single Family Home Lives with:: Spouse Patient language and need for interpreter reviewed:: Yes Do you feel safe going back to the place where you live?: Yes      Need for Family Participation in Patient Care: No (Comment) Care giver support system in place?: Yes (comment) Current home services: DME (home oxygen 3 liters) Criminal Activity/Legal Involvement Pertinent to Current Situation/Hospitalization: No - Comment as needed  Activities of Daily Living      Permission Sought/Granted                  Emotional Assessment Appearance:: Appears stated age Attitude/Demeanor/Rapport:  Engaged Affect (typically observed): Appropriate Orientation: : Oriented to  Time, Oriented to Situation, Oriented to Place, Oriented to Self Alcohol / Substance Use: Not Applicable Psych Involvement: No (comment)  Admission diagnosis:  COPD exacerbation (HCC) [J44.1] Acute on chronic congestive heart failure (HCC) [I50.9] Acute on chronic respiratory failure with hypoxia (HCC) [J96.21] Congestive heart failure, unspecified HF chronicity, unspecified heart failure type (HCC) [I50.9] 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   Chronic diastolic (congestive) heart failure (HCC)    PAD (peripheral artery disease) (HCC) 10/22/2010   DERMATITIS, STASIS 09/14/2010   Chronic pulmonary hypertension secondary to elevated L H pressures  04/15/2009   ERECTILE DYSFUNCTION 01/06/2009   Mixed diabetic hyperlipidemia associated with type 2 diabetes mellitus (HCC) 12/13/2007   Essential hypertension 12/13/2007   Anxiety--insomnia 08/14/2007   Type  2 diabetes mellitus without complication, without long-term current use of insulin (HCC) 01/02/2007   Chronic atrial fibrillation (HCC) 01/02/2007   Benign enlargement of prostate 01/02/2007   PCP:  [name], MD Pharmacy:   [id number] - [place], [place] - [address] [place] [address] Phone: [phone] Fax: [phone]  [facility] - [place] -  [address] [place] [address] Phone: [phone] Fax: [phone]     Social Determinants of Health (SDOH) Interventions    Readmission Risk Interventions Readmission Risk Prevention Plan 08/03/2021  Transportation Screening Complete  PCP or Specialist Appt within 5-7 Days Complete  Home Care Screening Complete  Medication Review (RN CM) Complete  Some recent data might be hidden

## 2021-08-03 NOTE — Consult Note (Signed)
   [facility] CM Inpatient Consult   08/03/2021  Bradley GERACI Sr. Apr 18, 1941 [id number]  [facility] [[facility]]  Accountable Care Organization [ACO] Patient: Medicare  Primary Care Provider:  [name], MD [facility], an Embedded provider that does the [facility] follow up  Patient was screened for high risk score in  an Embedded practice.  This provider has a chronic disease management Embedded Care Management team. Spoke with patient at the bedside dressed and ready for transitioning home.  Introduce self and post hospital follow up with PCP and with Embedded RN for chronic care management. Patient states, "yes, that'll be fine."  Plan: Will make referral to the [facility] Embedded Care Management for Chronic Care Management, if eligible.  Please contact for further questions,  Charlesetta Shanks, RN BSN CCM [facility]  [phone] business mobile phone Toll free office [phone]  Fax number: [phone] [email or web address]

## 2021-08-03 NOTE — TOC Transition Note (Signed)
Transition of Care [facility]) - CM/SW Discharge Note   Patient Details  Name: Bradley CARPENTER Sr. MRN: [id number] Date of Birth: 1941/07/25  Transition of Care [facility]) CM/SW Contact:  Leone Haven, RN Phone Number: 08/03/2021, 9:14 AM   Clinical Narrative:    Patient is for dc today, he lives with his wife and dog at home. He is indep, still drives. His sister n law will transport him home today. He has no issues with getting his medications or taking them. He has a scale at home and weighs himself daily, he also checks his bp.  He states he eats a low sodium diet and his wife makes sure of that.  His sister n law will bring his oxygent tank to transport him home today.    Final next level of care: Home/Self Care Barriers to Discharge: No Barriers Identified   Patient Goals and CMS Choice Patient states their goals for this hospitalization and ongoing recovery are:: return home   Choice offered to / list presented to : NA  Discharge Placement                       Discharge Plan and Services   Discharge Planning Services: CM Consult                      HH Arranged: NA          Social Determinants of Health (SDOH) Interventions     Readmission Risk Interventions Readmission Risk Prevention Plan 08/03/2021  Transportation Screening Complete  PCP or Specialist Appt within 5-7 Days Complete  Home Care Screening Complete  Medication Review (RN CM) Complete  Some recent data might be hidden

## 2021-08-03 NOTE — Progress Notes (Signed)
Discharge instructions (including medications) discussed with and copy provided to patient/caregiver 

## 2021-08-03 NOTE — Discharge Summary (Signed)
Physician Discharge Summary   Patient name: Bradley COWDEN Sr.  Admit date:     07/29/2021  Discharge date: 08/03/2021  Discharge Physician: Joseph Art   PCP: [name], MD   Recommendations at discharge: BMP 1 week, daily weights  Discharge Diagnoses Principal Problem:   Acute on chronic respiratory failure with hypoxia (HCC) Active Problems:   Type 2 diabetes mellitus without complication, without long-term current use of insulin (HCC)   Mixed diabetic hyperlipidemia associated with type 2 diabetes mellitus (HCC)   Essential hypertension   Chronic atrial fibrillation (HCC)   COPD with acute exacerbation (HCC)   Acute on chronic systolic CHF (congestive heart failure) (HCC)   GERD without esophagitis    * Acute on chronic respiratory failure with hypoxia (HCC) ABG consistent with acute hypoxic respiratory failure with PaO2 of 53.1 on 12 L of salter high flow nasal cannula  chest x-ray showed some evidence of patchy bilateral infiltrates that could possibly be pulmonary edema with notable significant increase in weight by approximately 12 kg over the past month Due to somewhat unclear picture treating patient both for COPD exacerbation with steroids and aggressive bronchodilator therapy in addition to intravenous diuretics for now. CT Scan negative for PE -sats ok 90-92% if no increased work of breathing/symptoms Wean down to 2L  GERD without esophagitis Continuing home regimen of daily PPI therapy.   Acute on chronic systolic CHF (congestive heart failure) (HCC) With patchy infiltrates on chest x-ray and documented increasing white blood pressure troponins over the past month congestive heart failure is at least in part contributing to patient's current acute hypoxic respiratory failure. Strict input and output monitoring Daily weights (down 40lbs if weights are accurate) D/c IV lasix as suspect he has diuresed enough  COPD with acute exacerbation (HCC) Aggressive  bronchodilator therapy with both scheduled and as needed nebulized treatments Systemic steroids with Solu-Medrol-- lungs improved on 10/22-wean to PO for the AM  Chronic atrial fibrillation (HCC) Currently rate controlled Not on anticoagulation in the outpatient setting due to history of intracranial hemorrhage Continue home regimen of Coreg Monitoring on telemetry   Essential hypertension Resume patients home regimen or oral antihypertensives Titrate antihypertensive regimen as necessary to achieve adequate BP control   Mixed diabetic hyperlipidemia associated with type 2 diabetes mellitus (HCC) Continuing home regimen of lipid lowering therapy.   Type 2 diabetes mellitus without complication, without long-term current use of insulin (HCC) SSI Hemoglobin A1C: 5.9 Diabetic Diet Monitor closely while on steroids        Condition at discharge: improving  Exam Lungs clear  Disposition: Home  Discharge time: greater than 30 minutes.  Follow-up Information     [name], MD. Go on 08/11/2021.   Specialty: Internal Medicine Why: @1 :00pm Contact information: [address] [place] [address]         [phone], MD .   Specialty: Cardiology Contact information: [address] [place] [address] [place] [phone]                 Allergies as of 08/03/2021       Reactions   Hydrocodone Other (See Comments)   "given to him in the hospital; went thru withdrawals once home; dr said not to take it again" (09/27/2012)   Tramadol Other (See Comments)   "given to him in the hospital; went thru withdrawals once home; dr said not to take it again" (09/27/2012)   Tadalafil  Other (See Comments)   headache, backache   Avelox [moxifloxacin Hydrochloride] Itching, Other (See Comments)   Headache        Medication List     STOP taking these medications    diphenhydramine-acetaminophen 25-500 MG Tabs  tablet Commonly known as: TYLENOL PM   Tylenol PM Extra Strength 25-500 MG Tabs tablet Generic drug: diphenhydramine-acetaminophen       TAKE these medications    acetaminophen 500 MG tablet Commonly known as: TYLENOL Take 500 mg by mouth every 6 (six) hours as needed for headache.   albuterol 108 (90 Base) MCG/ACT inhaler Commonly known as: VENTOLIN HFA Inhale 2 puffs into the lungs every 6 (six) hours as needed for wheezing or shortness of breath.   amLODipine 10 MG tablet Commonly known as: NORVASC Take 0.5 tablets (5 mg total) by mouth daily. What changed: how much to take   aspirin 81 MG tablet Take 81 mg by mouth every morning.   atorvastatin 20 MG tablet Commonly known as: LIPITOR TAKE 1 TABLET AT BEDTIME   budesonide 0.5 MG/2ML nebulizer solution Commonly known as: PULMICORT Take 2 mLs (0.5 mg total) by nebulization 2 (two) times daily.   calcium-vitamin D 500-200 MG-UNIT tablet Commonly known as: OSCAL WITH D Take 1 tablet by mouth every morning.   carvedilol 25 MG tablet Commonly known as: COREG TAKE 1 TABLET TWICE A DAY WITH MEALS   cholecalciferol 25 MCG (1000 UNIT) tablet Commonly known as: VITAMIN D3 Take 1,000 Units by mouth daily.   doxycycline 100 MG tablet Commonly known as: VIBRA-TABS Take 1 tablet (100 mg total) by mouth every 12 (twelve) hours.   furosemide 40 MG tablet Commonly known as: LASIX TAKE 2 TABLETS DAILY EXCEPT ON MONDAY, WEDNESDAY, AND FRIDAY TAKE 2 TABLETS IN THE MORNING AND 1 TABLET IN THE AFTERNOON What changed:  how much to take how to take this when to take this additional instructions   guaiFENesin 600 MG 12 hr tablet Commonly known as: MUCINEX Take 1 tablet (600 mg total) by mouth 2 (two) times daily.   ipratropium-albuterol 0.5-2.5 (3) MG/3ML Soln Commonly known as: DUONEB Take 3 mLs by nebulization 2 (two) times daily as needed. What changed: reasons to take this   Januvia 100 MG tablet Generic drug:  sitaGLIPtin TAKE 1 TABLET DAILY What changed: how much to take   loratadine 10 MG tablet Commonly known as: CLARITIN Take 1 tablet (10 mg total) by mouth daily. Start taking on: August 04, 2021   losartan 50 MG tablet Commonly known as: COZAAR Take 0.5 tablets (25 mg total) by mouth daily. What changed: how much to take   multivitamins ther. w/minerals Tabs tablet Take 1 tablet by mouth daily.   nitroGLYCERIN 0.4 MG SL tablet Commonly known as: NITROSTAT Place 1 tablet (0.4 mg total) under the tongue every 5 (five) minutes x 3 doses as needed for chest pain.   onetouch ultrasoft lancets Check blood sugars no more than twice daily   OneTouch Verio test strip Generic drug: glucose blood USE TO CHECK BLOOD SUGAR NO MORE THAN TWICE A DAY   pantoprazole 40 MG tablet Commonly known as: PROTONIX Take 40 mg by mouth daily.   predniSONE 50 MG tablet Commonly known as: DELTASONE Take 1 tablet (50 mg total) by mouth daily with breakfast for 3 days. Start taking on: August 04, 2021   tamsulosin 0.4 MG Caps capsule Commonly known as: FLOMAX TAKE 1 CAPSULE DAILY AFTER SUPPER What changed: See the new  instructions.        CT Angio Chest Pulmonary Embolism (PE) W or WO Contrast  Result Date: 07/30/2021 CLINICAL DATA:  Shortness of breath, evaluate for possible pulmonary embolism. EXAM: CT ANGIOGRAPHY CHEST WITH CONTRAST TECHNIQUE: Multidetector CT imaging of the chest was performed using the standard protocol during bolus administration of intravenous contrast. Multiplanar CT image reconstructions and MIPs were obtained to evaluate the vascular anatomy. CONTRAST:  80mL OMNIPAQUE IOHEXOL 350 MG/ML SOLN COMPARISON:  Chest x-ray from the previous day. FINDINGS: Cardiovascular: Atherosclerotic calcifications of the aorta are noted without aneurysmal dilatation. No significant opacification of the aorta is seen. Heart is at the upper limits of normal in size. Coronary calcifications are  noted. Pulmonary artery shows a normal branching pattern bilaterally. No focal filling defect is identified to suggest pulmonary embolism. Mediastinum/Nodes: Thoracic inlet is within normal limits. There remain some mildly prominent mediastinal lymph nodes which are stable from previous exams. Subcarinal node is noted also stable from multiple previous exams. These are likely reactive in nature. The esophagus as visualized is within normal limits. Lungs/Pleura: Lungs are well aerated bilaterally. Mosaic attenuation is noted consistent with air trapping. There are changes of mucous plugging in the lower lobes bilaterally with mild associated atelectatic changes. No sizable focal infiltrate is seen. No effusion is noted. No parenchymal nodules are seen. Upper Abdomen: Visualized upper abdomen shows no acute abnormality. Musculoskeletal: Degenerative changes of the thoracic spine are seen. No rib fracture is noted. No compression deformities are noted. Review of the MIP images confirms the above findings. IMPRESSION: No evidence of pulmonary emboli. Mild mucous plugging in the lower lobes bilaterally with associated atelectatic changes. Aortic Atherosclerosis (ICD10-I70.0). Electronically Signed   By: Alcide Clever M.D.   On: 07/30/2021 01:03   DG Chest Portable 1 View  Result Date: 07/29/2021 CLINICAL DATA:  Shortness of breath EXAM: PORTABLE CHEST 1 VIEW COMPARISON:  03/18/2021 FINDINGS: Left pacer remains in place, unchanged. Cardiomegaly, vascular congestion. Interstitial prominence in the mid and lower lungs may reflect early interstitial edema. No effusions or acute bony abnormality. Aortic atherosclerosis. IMPRESSION: Cardiomegaly with vascular congestion and possible early interstitial edema. Electronically Signed   By: Charlett Nose M.D.   On: 07/29/2021 18:53   ECHOCARDIOGRAM COMPLETE  Result Date: 07/30/2021    ECHOCARDIOGRAM REPORT   Patient Name:   Bradley KARL Sr. Date of Exam: 07/30/2021 Medical  Rec #:  [id number]         Height:       69.0 in Accession #:    [id number]        Weight:       220.0 lb Date of Birth:  Dec 08, 1940          BSA:          2.151 m Patient Age:    80 years          BP:           122/59 mmHg Patient Gender: M                 HR:           70 bpm. Exam Location:  Inpatient Procedure: 2D Echo, Cardiac Doppler and Color Doppler Indications:    CHF  History:        Patient has prior history of Echocardiogram examinations, most                 recent 09/03/2020. CHF, CAD, Pacemaker, COPD, PAD and Pulmonary  HTN, Arrythmias:Atrial Fibrillation, Signs/Symptoms:Shortness of                 Breath; Risk Factors:Hypertension, Dyslipidemia and Diabetes.  Sonographer:    Lavenia Atlas RDCS Referring Phys: [id number] Deno Lunger [facility] IMPRESSIONS  1. Left ventricular ejection fraction, by estimation, is 55 to 60%. The left ventricle has normal function. The left ventricle has no regional wall motion abnormalities. Left ventricular diastolic parameters are indeterminate.  2. Right ventricular systolic function is normal. The right ventricular size is mildly enlarged. There is moderately elevated pulmonary artery systolic pressure. The estimated right ventricular systolic pressure is 59.4 mmHg.  3. Left atrial size was moderately dilated.  4. Right atrial size was moderately dilated.  5. The mitral valve is normal in structure. Trivial mitral valve regurgitation. No evidence of mitral stenosis.  6. Tricuspid valve regurgitation is mild to moderate.  7. The aortic valve is tricuspid. Aortic valve regurgitation is not visualized. Mild aortic valve sclerosis is present, with no evidence of aortic valve stenosis.  8. The inferior vena cava is dilated in size with <50% respiratory variability, suggesting right atrial pressure of 15 mmHg. FINDINGS  Left Ventricle: Left ventricular ejection fraction, by estimation, is 55 to 60%. The left ventricle has normal function. The left ventricle has  no regional wall motion abnormalities. The left ventricular internal cavity size was normal in size. There is  no left ventricular hypertrophy. Left ventricular diastolic parameters are indeterminate. Right Ventricle: The right ventricular size is mildly enlarged. No increase in right ventricular wall thickness. Right ventricular systolic function is normal. There is moderately elevated pulmonary artery systolic pressure. The tricuspid regurgitant velocity is 3.33 m/s, and with an assumed right atrial pressure of 15 mmHg, the estimated right ventricular systolic pressure is 59.4 mmHg. Left Atrium: Left atrial size was moderately dilated. Right Atrium: Right atrial size was moderately dilated. Pericardium: There is no evidence of pericardial effusion. Mitral Valve: The mitral valve is normal in structure. Mild mitral annular calcification. Trivial mitral valve regurgitation. No evidence of mitral valve stenosis. Tricuspid Valve: The tricuspid valve is normal in structure. Tricuspid valve regurgitation is mild to moderate. Aortic Valve: The aortic valve is tricuspid. Aortic valve regurgitation is not visualized. Mild aortic valve sclerosis is present, with no evidence of aortic valve stenosis. Pulmonic Valve: The pulmonic valve was normal in structure. Pulmonic valve regurgitation is not visualized. Aorta: The aortic root is normal in size and structure. Venous: The inferior vena cava is dilated in size with less than 50% respiratory variability, suggesting right atrial pressure of 15 mmHg. IAS/Shunts: No atrial level shunt detected by color flow Doppler.  LEFT VENTRICLE PLAX 2D LVIDd:         5.10 cm     Diastology LVIDs:         3.60 cm     LV e' medial:    8.05 cm/s LV PW:         1.10 cm     LV E/e' medial:  12.7 LV IVS:        1.00 cm     LV e' lateral:   11.00 cm/s LVOT diam:     2.10 cm     LV E/e' lateral: 9.3 LV SV:         67 LV SV Index:   31 LVOT Area:     3.46 cm  LV Volumes (MOD) LV vol d, MOD A4C: 71.7  ml LV vol s, MOD A4C: 29.0 ml LV  SV MOD A4C:     71.7 ml RIGHT VENTRICLE RV Basal diam:  3.40 cm RV S prime:     11.60 cm/s TAPSE (M-mode): 1.9 cm LEFT ATRIUM             Index        RIGHT ATRIUM           Index LA diam:        5.00 cm 2.32 cm/m   RA Area:     31.00 cm LA Vol (A2C):   55.5 ml 25.80 ml/m  RA Volume:   131.00 ml 60.89 ml/m LA Vol (A4C):   57.2 ml 26.59 ml/m LA Biplane Vol: 58.7 ml 27.28 ml/m  AORTIC VALVE LVOT Vmax:   95.10 cm/s LVOT Vmean:  56.600 cm/s LVOT VTI:    0.193 m  AORTA Ao Root diam: 3.30 cm MITRAL VALVE                TRICUSPID VALVE MV Area (PHT): 4.26 cm     TR Peak grad:   44.4 mmHg MV Decel Time: 178 msec     TR Vmax:        333.00 cm/s MV E velocity: 102.00 cm/s MV A velocity: 33.70 cm/s   SHUNTS MV E/A ratio:  3.03         Systemic VTI:  0.19 m                             Systemic Diam: 2.10 cm Dalton McleanMD Electronically signed by Wilfred Lacy Signature Date/Time: 07/30/2021/6:01:29 PM    Final    Results for orders placed or performed during the hospital encounter of 07/29/21  Resp Panel by RT-PCR (Flu A&B, Covid) Nasopharyngeal Swab     Status: None   Collection Time: 07/29/21  9:07 PM   Specimen: Nasopharyngeal Swab; Nasopharyngeal(NP) swabs in vial transport medium  Result Value Ref Range Status   SARS Coronavirus 2 by RT PCR NEGATIVE NEGATIVE Final    Comment: (NOTE) SARS-CoV-2 target nucleic acids are NOT DETECTED.  The SARS-CoV-2 RNA is generally detectable in upper respiratory specimens during the acute phase of infection. The lowest concentration of SARS-CoV-2 viral copies this assay can detect is 138 copies/mL. A negative result does not preclude SARS-Cov-2 infection and should not be used as the sole basis for treatment or other patient management decisions. A negative result may occur with  improper specimen collection/handling, submission of specimen other than nasopharyngeal swab, presence of viral mutation(s) within the areas targeted  by this assay, and inadequate number of viral copies(<138 copies/mL). A negative result must be combined with clinical observations, patient history, and epidemiological information. The expected result is Negative.  Fact Sheet for Patients:  BloggerCourse.com  Fact Sheet for Healthcare Providers:  SeriousBroker.it  This test is no t yet approved or cleared by the [place] FDA and  has been authorized for detection and/or diagnosis of SARS-CoV-2 by FDA under an Emergency Use Authorization (EUA). This EUA will remain  in effect (meaning this test can be used) for the duration of the COVID-19 declaration under Section 564(b)(1) of the Act, 21 U.S.C.section 360bbb-3(b)(1), unless the authorization is terminated  or revoked sooner.       Influenza A by PCR NEGATIVE NEGATIVE Final   Influenza B by PCR NEGATIVE NEGATIVE Final    Comment: (NOTE) The Xpert Xpress SARS-CoV-2/FLU/RSV plus assay is intended as an aid in the diagnosis  of influenza from Nasopharyngeal swab specimens and should not be used as a sole basis for treatment. Nasal washings and aspirates are unacceptable for Xpert Xpress SARS-CoV-2/FLU/RSV testing.  Fact Sheet for Patients: BloggerCourse.com  Fact Sheet for Healthcare Providers: SeriousBroker.it  This test is not yet approved or cleared by the [place] FDA and has been authorized for detection and/or diagnosis of SARS-CoV-2 by FDA under an Emergency Use Authorization (EUA). This EUA will remain in effect (meaning this test can be used) for the duration of the COVID-19 declaration under Section 564(b)(1) of the Act, 21 U.S.C. section 360bbb-3(b)(1), unless the authorization is terminated or revoked.  Performed at [facility], [address] [place], [place] [address]     Signed:  Joseph Art DO [facility] 08/03/2021, 3:54  PM

## 2021-08-03 NOTE — Progress Notes (Signed)
Heart Failure Navigator Progress Note  Assessed for Heart & Vascular TOC clinic readiness.  Patient does not meet criteria due to pt declined. Planning DC today, previous oxygen set up for home. On 3LPM [place]. States meds are affordable, has express scripts.    Navigator available for reassessment of patient.   Ozella Rocks, MSN, RN Heart Failure Nurse Navigator [phone]

## 2021-08-03 NOTE — Plan of Care (Signed)
  Problem: Education: Goal: Knowledge of General Education information will improve Description: Including pain rating scale, medication(s)/side effects and non-pharmacologic comfort measures Outcome: Adequate for Discharge   

## 2021-08-03 NOTE — Care Management Important Message (Signed)
Important Message  Patient Details  Name: Bradley LANT Sr. MRN: [id number] Date of Birth: [birth year]   Medicare Important Message Given:  Yes     Renie Ora 08/03/2021, 8:38 AM

## 2021-08-04 ENCOUNTER — Telehealth: Payer: Self-pay

## 2021-08-04 ENCOUNTER — Telehealth: Payer: Self-pay | Admitting: *Deleted

## 2021-08-04 NOTE — Chronic Care Management (AMB) (Signed)
  Chronic Care Management   Note  08/04/2021 Name: HENNING EHLE Sr. MRN: [id number] DOB: March 22, 1941  [name] is a 80 y.o. year old male who is a primary care patient of [name], MD. [name] is currently enrolled in care management services. An additional referral for RNCM  was placed.   Follow up plan: Unsuccessful telephone outreach attempt made. A HIPAA compliant phone message was left for the patient providing contact information and requesting a return call.   Burman Nieves, CCMA Care Guide, Embedded Care Coordination [facility]  Care Management  Direct Dial: [phone]

## 2021-08-04 NOTE — Chronic Care Management (AMB) (Signed)
  Chronic Care Management   Note  08/04/2021 Name: Bradley ASEBEDO Sr. MRN: [id number] DOB: 1941-03-12  Bradley Searles Sr. is a 80 y.o. year old male who is a primary care patient of [name], MD. Bradley Searles Sr. is currently enrolled in care management services. An additional referral for RNCM was placed.   Follow up plan: Telephone appointment with care management team member scheduled for: 08/17/2021  Burman Nieves, CCMA Care Guide, Embedded Care Coordination [facility]  Care Management  Direct Dial: [phone]

## 2021-08-04 NOTE — Telephone Encounter (Signed)
Transition Care Management Follow-up Telephone Call Date of discharge and from where: 08/03/2021-[facility] How have you been since you were released from the hospital? Doing fine per wife. Any questions or concerns? No  Items Reviewed: Did the pt receive and understand the discharge instructions provided? Yes  Medications obtained and verified? Yes  Other? Yes  Any new allergies since your discharge? No  Dietary orders reviewed? Yes Do you have support at home? Yes   Home Care and Equipment/Supplies: Were home health services ordered? no If so, what is the name of the agency? N/a  Has the agency set up a time to come to the patient's home? not applicable Were any new equipment or medical supplies ordered?  No What is the name of the medical supply agency? N/a Were you able to get the supplies/equipment? not applicable Do you have any questions related to the use of the equipment or supplies? N/a  Functional Questionnaire: (I = Independent and D = Dependent) ADLs: I  Bathing/Dressing- I  Meal Prep- I  Eating- I  Maintaining continence- I  Transferring/Ambulation- I  Managing Meds- I  Follow up appointments reviewed:  PCP Hospital f/u appt confirmed? Yes  Scheduled to see [name] on 08/11/2021 @ 1:00. Specialist Hospital f/u appt confirmed? No  Cardiology to schedule Are transportation arrangements needed? No  If their condition worsens, is the pt aware to call PCP or go to the Emergency Dept.? Yes Was the patient provided with contact information for the PCP's office or ED? Yes Was to pt encouraged to call back with questions or concerns? Yes

## 2021-08-11 ENCOUNTER — Ambulatory Visit: Payer: Medicare Other | Attending: Internal Medicine

## 2021-08-11 ENCOUNTER — Other Ambulatory Visit: Payer: Self-pay

## 2021-08-11 ENCOUNTER — Encounter: Payer: Self-pay | Admitting: Internal Medicine

## 2021-08-11 ENCOUNTER — Ambulatory Visit (INDEPENDENT_AMBULATORY_CARE_PROVIDER_SITE_OTHER): Payer: Medicare Other | Admitting: Internal Medicine

## 2021-08-11 VITALS — BP 112/62 | HR 91 | Temp 98.0°F | Ht 69.0 in | Wt 190.6 lb

## 2021-08-11 DIAGNOSIS — Z7185 Encounter for immunization safety counseling: Secondary | ICD-10-CM | POA: Diagnosis not present

## 2021-08-11 DIAGNOSIS — Z23 Encounter for immunization: Secondary | ICD-10-CM

## 2021-08-11 DIAGNOSIS — I5032 Chronic diastolic (congestive) heart failure: Secondary | ICD-10-CM | POA: Diagnosis not present

## 2021-08-11 DIAGNOSIS — E118 Type 2 diabetes mellitus with unspecified complications: Secondary | ICD-10-CM

## 2021-08-11 DIAGNOSIS — J9612 Chronic respiratory failure with hypercapnia: Secondary | ICD-10-CM

## 2021-08-11 DIAGNOSIS — J9611 Chronic respiratory failure with hypoxia: Secondary | ICD-10-CM | POA: Diagnosis not present

## 2021-08-11 NOTE — Progress Notes (Signed)
   [id number]  Name:  Bradley HIGHLEY Sr.    MRN: [id number] DOB: 10-21-1940  08/11/2021  [name] was observed post Covid-19 immunization for 15 minutes without incident. He was provided with Vaccine Information Sheet and instruction to access the V-Safe system.   [name] was instructed to call 911 with any severe reactions post vaccine: Difficulty breathing  Swelling of face and throat  A fast heartbeat  A bad rash all over body  Dizziness and weakness   Immunizations Administered     Name Date Dose VIS Date Route   Pfizer Covid-19 Vaccine Bivalent Booster 08/11/2021  1:42 PM 0.3 mL 06/10/2021 Intramuscular   Manufacturer: [employer], [employer]   Lot: [id number]   NDC: [phone]

## 2021-08-11 NOTE — Progress Notes (Signed)
Subjective:    Patient ID: Bradley Sit., male    DOB: [birth year], 80 y.o.   MRN: [id number]  DOS:  08/11/2021 Type of visit - description: TCM 14  Admitted to hospital and discharged 08/03/2021 He was admitted with difficulty breathing above from baseline for 1 week. He was treated for the following conditions: Acute on chronic respiratory failure with hypoxia, chest x-ray with possible pulmonary edema but because picture was not completely clear, he was treated for a COPD exacerbation.  No PE per CT.  Acute on chronic systolic heart failure. He was on IV Lasix initially, he lost a significant amount of weight while in the hospital.  A. fib, rate controlled, not anticoagulated.  HTN: Discharge on home meds.    Review of Systems Since he left the hospital he is feeling well. Finish antibiotics Monitoring weight at home: Stable O2 sat about 95%, decrease with exertion. No fever chills No nausea, vomiting, diarrhea No cough or wheezing  Past Medical History:  Diagnosis Date   Abnormal CT scan, chest 2012   CT chest, several lymphadenopathies. Sees pulmonary   Anemia    intermittent   Arthritis    "?back" (09/19/2018)   Atrial fibrillation (HCC)    s/p AV node ablation & BiV PPM implantation 09/08/11 (op dictation pending)   BPH (benign prostatic hyperplasia)    Saw Dr [name] 2004, normal renal u/s   Bronchitis 08/26/2017   CAD (coronary artery disease)    CHF (congestive heart failure) (HCC)    Thought primarily to be non-systolic although EF down (EF 69-48% 12/2010, down to 35-40% 09/05/11), cath 2008 with no CAD, nuclear study 07/2011 showing Small area of reversibility in the distal ant/lat wall the left ventricle suspicious for ischemia/septal wall HK but felt to be low risk  (per D/C Summary 07/2011)   Chronic bronchitis (HCC)    "get it ~ q yr"   Chronic respiratory failure with hypoxia and hypercapnia (HCC) 09/14/2010   Followed in Pulmonary clinic/ [facility]  Started on 02 2lpm at discharge 09/23/11   - PFT's 10/28/2011  FEV1  1.40 (51%)  with ratio 70 and no better p B2 and DLCO 53 corrects to 101    - PFT's 10/18/2014    FEV1 1.71 (59%) with ratio 68 and no sign change p B2 and DLCO 53 corrects to 85  -  HC03   07/28/20  = 33  -  HCO3   03/17/21     = 31     Heart murmur    HLD (hyperlipidemia)    HTN (hypertension)    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   Pleural effusion 2008   S/p decortication   Pneumonia 08/02/2011   Pulmonary HTN (HCC)    per cath 2008   Type II diabetes mellitus (HCC) 1999    Past Surgical History:  Procedure Laterality Date   BI-VENTRICULAR PACEMAKER INSERTION Left 09/08/2011   Procedure: BI-VENTRICULAR PACEMAKER INSERTION (CRT-P);  Surgeon: Marinus Maw, MD;  Location: [facility] CATH LAB;  Service: Cardiovascular;  Laterality: Left;   CARDIAC CATHETERIZATION     "couple times; never had balloon or stent" (09/19/2018)   CATARACT EXTRACTION W/ INTRAOCULAR LENS  IMPLANT, BILATERAL Bilateral  08/2018   COLONOSCOPY  03/10/11   normal   INSERT / REPLACE / REMOVE PACEMAKER  09/08/11   pacemaker placement   LUNG DECORTICATION     PLEURAL SCARIFICATION     pneumothorax with fibrothorax  ~ 2010   TONSILLECTOMY     "as a kid"     Allergies as of 08/11/2021       Reactions   Hydrocodone Other (See Comments)   "given to him in the hospital; went thru withdrawals once home; dr said not to take it again" (09/27/2012)   Tramadol Other (See Comments)   "given to him in the hospital; went thru withdrawals once home; dr said not to take it again" (09/27/2012)   Tadalafil Other (See Comments)   headache, backache   Avelox [moxifloxacin Hydrochloride] Itching, Other (See Comments)   Headache        Medication List         Accurate as of August 11, 2021 11:59 PM. If you have any questions, ask your nurse or doctor.          STOP taking these medications    doxycycline 100 MG tablet Commonly known as: VIBRA-TABS Stopped by: Kathlene November, MD       TAKE these medications    acetaminophen 500 MG tablet Commonly known as: TYLENOL Take 500 mg by mouth every 6 (six) hours as needed for headache.   albuterol 108 (90 Base) MCG/ACT inhaler Commonly known as: VENTOLIN HFA Inhale 2 puffs into the lungs every 6 (six) hours as needed for wheezing or shortness of breath.   amLODipine 10 MG tablet Commonly known as: NORVASC Take 0.5 tablets (5 mg total) by mouth daily.   aspirin 81 MG tablet Take 81 mg by mouth every morning.   atorvastatin 20 MG tablet Commonly known as: LIPITOR TAKE 1 TABLET AT BEDTIME   budesonide 0.5 MG/2ML nebulizer solution Commonly known as: PULMICORT Take 2 mLs (0.5 mg total) by nebulization 2 (two) times daily.   calcium-vitamin D 500-200 MG-UNIT tablet Commonly known as: OSCAL WITH D Take 1 tablet by mouth every morning.   carvedilol 25 MG tablet Commonly known as: COREG TAKE 1 TABLET TWICE A DAY WITH MEALS   cholecalciferol 25 MCG (1000 UNIT) tablet Commonly known as: VITAMIN D3 Take 1,000 Units by mouth daily.   furosemide 40 MG tablet Commonly known as: LASIX TAKE 2 TABLETS DAILY EXCEPT ON MONDAY, WEDNESDAY, AND FRIDAY TAKE 2 TABLETS IN THE MORNING AND 1 TABLET IN THE AFTERNOON What changed:  how much to take how to take this when to take this additional instructions   guaiFENesin 600 MG 12 hr tablet Commonly known as: MUCINEX Take 1 tablet (600 mg total) by mouth 2 (two) times daily.   ipratropium-albuterol 0.5-2.5 (3) MG/3ML Soln Commonly known as: DUONEB Take 3 mLs by nebulization 2 (two) times daily as needed. What changed: reasons to take this   Januvia 100 MG tablet Generic drug: sitaGLIPtin TAKE 1 TABLET DAILY What changed: how much to take    loratadine 10 MG tablet Commonly known as: CLARITIN Take 1 tablet (10 mg total) by mouth daily.   losartan 50 MG tablet Commonly known as: COZAAR Take 0.5 tablets (25 mg total) by mouth daily.   multivitamins ther. w/minerals Tabs tablet Take 1 tablet by mouth daily.   nitroGLYCERIN 0.4 MG SL tablet Commonly known as: NITROSTAT Place 1 tablet (0.4 mg total) under the tongue every 5 (five) minutes x 3 doses as needed  for chest pain.   onetouch ultrasoft lancets Check blood sugars no more than twice daily   OneTouch Verio test strip Generic drug: glucose blood USE TO CHECK BLOOD SUGAR NO MORE THAN TWICE A DAY   pantoprazole 40 MG tablet Commonly known as: PROTONIX Take 40 mg by mouth daily.   tamsulosin 0.4 MG Caps capsule Commonly known as: FLOMAX TAKE 1 CAPSULE DAILY AFTER SUPPER What changed: See the new instructions.           Objective:   Physical Exam BP 112/62 (BP Location: Left Arm, Patient Position: Sitting, Cuff Size: Normal)   Pulse 91   Temp 98 F (36.7 C) (Oral)   Ht 5\' 9"  (1.753 m)   Wt 190 lb 9.6 oz (86.5 kg)   SpO2 91%   BMI 28.15 kg/m  General:   Well developed, NAD, BMI noted.  Has a oxygen concentrator. HEENT:  Normocephalic . Face symmetric, atraumatic Lungs:  CTA B Normal respiratory effort, no intercostal retractions, no accessory muscle use. Heart: Seems regular Lower extremities: no pretibial edema bilaterally  Skin: Not pale. Not jaundice Neurologic:  alert & oriented X3.  Speech normal, gait appropriate for age and unassisted Psych--  Cognition and judgment appear intact.  Cooperative with normal attention span and concentration.  Behavior appropriate. No anxious or depressed appearing.      Assessment    Assessment  DM HTN Hyperlipidemia GERD Anxiety- insomnia  BPH Cardiovascular:Dr Lovena Le  --  Cath x 2 [id number]: no significant CAD -- CHF, non-ischemic  --Atrial fibrillation; h/o AV node ablation, has a  Pacemaker --h/o IC bleeding: Not anticoagulated --Peripheral vascular disease? Pulmonary: Dr Melvyn Novas, last OV 10-18-2014, f/u prn --COPD, severe, chronic resp failure, night O2 prn, has portable O2  --Pleural effusion s/p decortication --Pulmonary hypertension  Stasis dermatitis   PLAN: TCM 14 Acute on chronic respiratory failure: Admitted to the hospital with increased shortness of breath, felt to be acute on chronic respiratory failure likely triggered by heart failure and possibly COPD exacerbation. Was treated for both conditions, was discharged home on essentially the same medications. He finished antibiotics. Since then is feeling well, good compliance with medications including Lasix, weight at home is stable with minor fluctuations. (Weight at this office 4 months ago -when he was at baseline- was 187 pounds, today at this office is 190 pounds). Plan: Continue present care, check a BMP and CBC Keep appointment with cardiology 09/17/2021.    DM:On Januvia, last A1c 5.9.  No change. Preventive care: Flu shot today Proceed with COVID vaccination. RTC 3 to 4 months, reach out sooner if needed.   This visit occurred during the SARS-CoV-2 public health emergency.  Safety protocols were in place, including screening questions prior to the visit, additional usage of staff PPE, and extensive cleaning of exam room while observing appropriate contact time as indicated for disinfecting solutions.

## 2021-08-11 NOTE — Patient Instructions (Signed)
Continue checking your weight.  If it changes more than 5 pounds let cardiology or me know.  Continue checking your blood pressures BP GOAL is between 110/65 and  135/85. If it is consistently higher or lower, let me know    GO TO THE LAB : Get the blood work     GO TO THE FRONT DESK, PLEASE SCHEDULE YOUR APPOINTMENTS Come back for a checkup in 3 to 4 months

## 2021-08-12 LAB — CBC WITH DIFFERENTIAL/PLATELET
Basophils Absolute: 0.1 10*3/uL (ref 0.0–0.1)
Basophils Relative: 0.9 % (ref 0.0–3.0)
Eosinophils Absolute: 0.1 10*3/uL (ref 0.0–0.7)
Eosinophils Relative: 0.7 % (ref 0.0–5.0)
HCT: 38.4 % — ABNORMAL LOW (ref 39.0–52.0)
Hemoglobin: 12.8 g/dL — ABNORMAL LOW (ref 13.0–17.0)
Lymphocytes Relative: 21.9 % (ref 12.0–46.0)
Lymphs Abs: 2.4 10*3/uL (ref 0.7–4.0)
MCHC: 33.2 g/dL (ref 30.0–36.0)
MCV: 92.4 fl (ref 78.0–100.0)
Monocytes Absolute: 1 10*3/uL (ref 0.1–1.0)
Monocytes Relative: 9.1 % (ref 3.0–12.0)
Neutro Abs: 7.3 10*3/uL (ref 1.4–7.7)
Neutrophils Relative %: 67.4 % (ref 43.0–77.0)
Platelets: 191 10*3/uL (ref 150.0–400.0)
RBC: 4.15 Mil/uL — ABNORMAL LOW (ref 4.22–5.81)
RDW: 13.5 % (ref 11.5–15.5)
WBC: 10.9 10*3/uL — ABNORMAL HIGH (ref 4.0–10.5)

## 2021-08-12 LAB — BASIC METABOLIC PANEL
BUN: 13 mg/dL (ref 6–23)
CO2: 36 mEq/L — ABNORMAL HIGH (ref 19–32)
Calcium: 8.4 mg/dL (ref 8.4–10.5)
Chloride: 97 mEq/L (ref 96–112)
Creatinine, Ser: 0.83 mg/dL (ref 0.40–1.50)
GFR: 82.66 mL/min (ref >60.00)
Glucose, Bld: 142 mg/dL — ABNORMAL HIGH (ref 70–99)
Potassium: 4.8 mEq/L (ref 3.5–5.1)
Sodium: 138 mEq/L (ref 135–145)

## 2021-08-13 NOTE — Assessment & Plan Note (Signed)
TCM 14 Acute on chronic respiratory failure: Admitted to the hospital with increased shortness of breath, felt to be acute on chronic respiratory failure likely triggered by heart failure and possibly COPD exacerbation. Was treated for both conditions, was discharged home on essentially the same medications. He finished antibiotics. Since then is feeling well, good compliance with medications including Lasix, weight at home is stable with minor fluctuations. (Weight at this office 4 months ago -when he was at baseline- was 187 pounds, today at this office is 190 pounds). Plan: Continue present care, check a BMP and CBC Keep appointment with cardiology 09/17/2021.    DM:On Januvia, last A1c 5.9.  No change. Preventive care: Flu shot today Proceed with COVID vaccination. RTC 3 to 4 months, reach out sooner if needed.

## 2021-08-17 ENCOUNTER — Ambulatory Visit (INDEPENDENT_AMBULATORY_CARE_PROVIDER_SITE_OTHER): Payer: Medicare Other

## 2021-08-17 DIAGNOSIS — I5032 Chronic diastolic (congestive) heart failure: Secondary | ICD-10-CM

## 2021-08-17 DIAGNOSIS — J9612 Chronic respiratory failure with hypercapnia: Secondary | ICD-10-CM

## 2021-08-17 DIAGNOSIS — E119 Type 2 diabetes mellitus without complications: Secondary | ICD-10-CM

## 2021-08-17 NOTE — Chronic Care Management (AMB) (Addendum)
Chronic Care Management   CCM RN Visit Note  08/17/2021 Name: Bradley BOETTCHER Sr. MRN: [id number] DOB: 16-Oct-1940  Subjective: Bradley Searles Sr. is a 80 y.o. year old male who is a primary care patient of [name], MD. The care management team was consulted for assistance with disease management and care coordination needs.    Engaged with patient by telephone for initial visit in response to provider referral for case management and/or care coordination services.   Consent to Services:  The patient was given information about Chronic Care Management services, agreed to services, and gave verbal consent prior to initiation of services.  Please see initial visit note for detailed documentation.   Patient agreed to services and verbal consent obtained.   Assessment: Review of patient past medical history, allergies, medications, health status, including review of consultants reports, laboratory and other test data, was performed as part of comprehensive evaluation and provision of chronic care management services.   SDOH (Social Determinants of Health) assessments and interventions performed:  SDOH Interventions    Flowsheet Row Most Recent Value  SDOH Interventions   Food Insecurity Interventions Intervention Not Indicated  Transportation Interventions Intervention Not Indicated        CCM Care Plan  Allergies  Allergen Reactions   Hydrocodone Other (See Comments)    "given to him in the hospital; went thru withdrawals once home; dr said not to take it again" (09/27/2012)   Tramadol Other (See Comments)    "given to him in the hospital; went thru withdrawals once home; dr said not to take it again" (09/27/2012)   Tadalafil Other (See Comments)    headache, backache   Avelox [Moxifloxacin Hydrochloride] Itching and Other (See Comments)    Headache    Outpatient Encounter Medications as of 08/17/2021  Medication Sig Note   acetaminophen (TYLENOL) 500 MG tablet Take 500 mg by  mouth every 6 (six) hours as needed for headache.    amLODipine (NORVASC) 10 MG tablet Take 0.5 tablets (5 mg total) by mouth daily.    aspirin 81 MG tablet Take 81 mg by mouth every morning.     atorvastatin (LIPITOR) 20 MG tablet TAKE 1 TABLET AT BEDTIME (Patient taking differently: Take 20 mg by mouth at bedtime.)    budesonide (PULMICORT) 0.5 MG/2ML nebulizer solution Take 2 mLs (0.5 mg total) by nebulization 2 (two) times daily.    calcium-vitamin D (OSCAL WITH D) 500-200 MG-UNIT tablet Take 1 tablet by mouth every morning.    carvedilol (COREG) 25 MG tablet TAKE 1 TABLET TWICE A DAY WITH MEALS    cholecalciferol (VITAMIN D3) 25 MCG (1000 UNIT) tablet Take 1,000 Units by mouth daily.    diphenhydramine-acetaminophen (TYLENOL PM) 25-500 MG TABS tablet Take 1 tablet by mouth at bedtime as needed. 08/17/2021: Reports takes every night   furosemide (LASIX) 40 MG tablet TAKE 2 TABLETS DAILY EXCEPT ON MONDAY, WEDNESDAY, AND FRIDAY TAKE 2 TABLETS IN THE MORNING AND 1 TABLET IN THE AFTERNOON (Patient taking differently: Take 40-80 mg by mouth See admin instructions. Take 80 mg (2 tablets) daily except on Monday, Wednesday, and Friday. Taking 2 tablets in the morning and 1( 40 mg) tablet in the afternoon.)    guaiFENesin (MUCINEX) 600 MG 12 hr tablet Take 1 tablet (600 mg total) by mouth 2 (two) times daily.    ipratropium-albuterol (DUONEB) 0.5-2.5 (3) MG/3ML SOLN Take 3 mLs by nebulization 2 (two) times daily as needed. (Patient taking differently: Take 3 mLs  by nebulization 2 (two) times daily as needed (shortness of breath).)    loratadine (CLARITIN) 10 MG tablet Take 1 tablet (10 mg total) by mouth daily.    losartan (COZAAR) 50 MG tablet Take 0.5 tablets (25 mg total) by mouth daily.    Multiple Vitamins-Minerals (MULTIVITAMINS THER. W/MINERALS) TABS tablet Take 1 tablet by mouth daily.    nitroGLYCERIN (NITROSTAT) 0.4 MG SL tablet Place 1 tablet (0.4 mg total) under the tongue every 5 (five) minutes  x 3 doses as needed for chest pain. 11/26/2019: PRN   pantoprazole (PROTONIX) 40 MG tablet Take 40 mg by mouth daily.    sitaGLIPtin (JANUVIA) 100 MG tablet TAKE 1 TABLET DAILY (Patient taking differently: Take 100 mg by mouth daily.)    tamsulosin (FLOMAX) 0.4 MG CAPS capsule TAKE 1 CAPSULE DAILY AFTER SUPPER (Patient taking differently: Take 0.4 mg by mouth daily after supper.)    albuterol (VENTOLIN HFA) 108 (90 Base) MCG/ACT inhaler Inhale 2 puffs into the lungs every 6 (six) hours as needed for wheezing or shortness of breath.    Lancets (ONETOUCH ULTRASOFT) lancets Check blood sugars no more than twice daily    ONETOUCH VERIO test strip USE TO CHECK BLOOD SUGAR NO MORE THAN TWICE A DAY    No facility-administered encounter medications on file as of 08/17/2021.    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   Chronic diastolic (congestive) heart failure (HCC)    PAD (peripheral artery disease) (HCC) 10/22/2010   DERMATITIS, STASIS 09/14/2010   Chronic pulmonary hypertension secondary to elevated L H pressures  04/15/2009   ERECTILE DYSFUNCTION 01/06/2009   Mixed diabetic hyperlipidemia associated with type 2 diabetes mellitus (HCC) 12/13/2007   Essential hypertension 12/13/2007   Anxiety--insomnia 08/14/2007   Type 2 diabetes mellitus without complication, without long-term current use of insulin (HCC) 01/02/2007   Chronic atrial fibrillation (HCC) 01/02/2007   Benign enlargement of  prostate 01/02/2007    Conditions to be addressed/monitored:CHF and COPD  Care Plan : RN Care Manager Plan of Care  Updates made by Colletta Maryland, RN since 08/17/2021 12:00 AM     Problem: No Plan of Care Established for Management of Chronic Disease (COPD, CHF)   Priority: High     Long-Range Goal: Development of Plan of Care for Chronic Disease (COPD, CHF)   Start Date: 08/17/2021  Expected End Date: 02/14/2022  This Visit's Progress: On track  Priority: High  Note:   Current Barriers: Per chart recent admission 07/29/21-08/03/21 with acute on chronic respiratory failure with hypoxia and acute on chronic congestive heart failure. Patent reports he is doing well last seen by PCP on 08/11/21. Denies SOB, swelling. States he has all his medications and is taking as prescribed. Also history of diabetes. States he checks blood sugar about every other day. He reports his wife and daughter are very supportive management of his health.  Chronic Disease Management support and education needs related to CHF and COPD  RNCM Clinical Goal(s):  Patient will  verbalize basic understanding of  CHF, COPD, and DMII disease process and self health management plan . attend all scheduled medical appointments: 08/25/21 Device check; 09/15/21 Clinical pharmacist; 09/17/21 Cardiologist demonstrate Ongoing adherence to prescribed treatment plan for CHF, COPD, and DMII as evidenced by taking medications as prescribed, attending provider visits as scheduled, calling providers with questions or concerns as needed. work with pharmacist to address medications/disease managment related toCHF, COPD, and DMII through collaboration with Medical illustrator, provider, and care team.   Interventions: 1:1 collaboration with primary care provider regarding development and update of comprehensive plan of care as evidenced by provider attestation and co-signature Inter-disciplinary care team collaboration (see longitudinal plan of  care) Evaluation of current treatment plan related to  self management and patient's adherence to plan as established by provider  Diabetes Interventions: Assessed patient's understanding of A1c goal: <7% Reviewed medications with patient and discussed importance of medication adherence; Discussed plans with patient for ongoing care management follow up and provided patient with direct contact information for care management team; Provided patient with written educational materials related to hypo and hyperglycemia and importance of correct treatment; Reviewed scheduled/upcoming provider appointments including: 08/25/21 Device check; 09/15/21 Clinical pharmacist; 09/17/21 Cardiologist; Lab Results  Component Value Date   HGBA1C 5.9 (H) 07/30/2021   COPD Interventions:   Provided patient with basic written and verbal COPD education on self care/management/and exacerbation prevention; Advised patient to track and manage COPD triggers;  Advised patient to self assesses COPD action plan zone and make appointment with provider if in the yellow zone for 48 hours without improvement; Discussed the importance of adequate rest and management of fatigue with COPD; Discussed COPD zone tool and when to call the doctor  Heart Failure Interventions: Reviewed Heart Failure Action Plan in depth and provided written copy; Assessed need for readable accurate scales in home; Discussed importance of daily weight and advised patient to weigh and record daily; Discussed signs/symptoms of heart failure exacerbation and when to call the doctor. Provided information of Congestive Heart Failure action plan  Patient Goals/Self-Care Activities: Patient will self administer medications as prescribed Patient will attend all scheduled provider appointments Patient will continue to perform ADL's independently Patient will continue to perform IADL's independently Patient will call provider office for new concerns or  questions Try to check and record blood sugars daily. Goal is 80-130 before eating and if blood sugar checked after a meal less than 180 after eating. Review education provided on COPD action plan, Heart failure action plan. Plan to discuss at next telephone call.   Plan:Telephone follow up appointment with care management team member scheduled for:  next month The patient has been provided with contact information for the care management team and has been advised to call with any health related questions or concerns.    Kathyrn Sheriff, RN, MSN, BSN, CCM Care Management Coordinator [facility] [phone]    I have reviewed and agree with [facility] documentation.  [name], MD

## 2021-08-17 NOTE — Patient Instructions (Addendum)
Visit Information: Thank you for taking the time to speak with me today.   Patient Goals/Self-Care Activities: Patient will self administer medications as prescribed Patient will attend all scheduled provider appointments Patient will continue to perform ADL's independently Patient will continue to perform IADL's independently Patient will call provider office for new concerns or questions Try to check and record blood sugars daily. Goal is 80-130 before eating and if blood sugar checked after a meal less than 180 after eating. Review education provided on COPD action plan, Heart failure action plan. Plan to discuss at next telephone call.    COPD Action Plan A COPD action plan is a description of what to do when you have a flare (exacerbation) of chronic obstructive pulmonary disease (COPD). Your action plan is a color-coded plan that lists the symptoms that indicate whether your condition is under control and what actions to take. If you have symptoms in the green zone, it means you are doing well that day. If you have symptoms in the yellow zone, it means you are having a bad day or an exacerbation. If you have symptoms in the red zone, you need urgent medical care. Follow the plan that you and your health care provider developed. Review your plan with your health care provider at each visit. Red zone Symptoms in this zone mean that you should get medical help right away. They include: Feeling very short of breath, even when you are resting. Not being able to do any activities because of poor breathing. Not being able to sleep because of poor breathing. Fever or shaking chills. Feeling confused or very sleepy. Chest pain. Coughing up blood. If you have any of these symptoms, call emergency services (911 in the [place] or go to the nearest emergency room. Yellow zone Symptoms in this zone mean that your condition may be getting worse. They include: Feeling more short of breath than  usual. Having less energy for daily activities than usual. Phlegm or mucus that is thicker than usual. Needing to use your rescue inhaler or nebulizer more often than usual. More ankle swelling than usual. Coughing more than usual. Feeling like you have a chest cold. Trouble sleeping due to COPD symptoms. Decreased appetite. COPD medicines not helping as much as usual. If you experience any "yellow" symptoms: Keep taking your daily medicines as directed. Use your quick-relief inhaler as told by your health care provider. If you were prescribed steroid medicine to take by mouth (oral medicine), start taking it as told by your health care provider. If you were prescribed an antibiotic medicine, start taking it as told by your health care provider. Do not stop taking the antibiotic even if you start to feel better. Use oxygen as told by your health care provider. Get more rest. Do your pursed-lip breathing exercises. Do not smoke. Avoid any irritants in the air. If your signs and symptoms do not improve after taking these steps, call your health care provider right away. Green zone Symptoms in this zone mean that you are doing well. They include: Being able to do your usual activities and exercise. Having the usual amount of coughing, including the same amount of phlegm or mucus. Being able to sleep well. Having a good appetite. Where to find more information: You can find more information about COPD from: American Lung Association, My COPD Action Plan: www.lung.org COPD Foundation: www.copdfoundation.org National Heart, Lung, & Blood Institute: [employer] Follow these instructions at home: Continue taking your daily medicines as told by  your health care provider. Make sure you receive all the immunizations that your health care provider recommends, especially the pneumococcal and influenza vaccines. Wash your hands often with soap and water. Have family members wash their hands  too. Regular hand washing can help prevent infections. Follow your usual exercise and diet plan. Avoid irritants in the air, such as smoke. Do not use any products that contain nicotine or tobacco. These products include cigarettes, chewing tobacco, and vaping devices, such as e-cigarettes. If you need help quitting, ask your health care provider. Summary A COPD action plan tells you what to do when you have a flare (exacerbation) of chronic obstructive pulmonary disease (COPD). Follow each action plan for your symptoms. If you have any symptoms in the red zone, call emergency services (911 in the [place] or go to the nearest emergency room. This information is not intended to replace advice given to you by your health care provider. Make sure you discuss any questions you have with your health care provider. Document Revised: 08/05/2020 Document Reviewed: 08/05/2020 Elsevier Patient Education  2022 [place].    Heart Failure Action Plan A heart failure action plan helps you understand what to do when you have symptoms of heart failure. Your action plan is a color-coded plan that lists the symptoms to watch for and indicates what actions to take. If you have symptoms in the red zone, you need medical care right away. If you have symptoms in the yellow zone, you are having problems. If you have symptoms in the green zone, you are doing well. Follow the plan that was created by you and your health care provider. Review your plan each time you visit your health care provider. Red zone These signs and symptoms mean you should get medical help right away: You have trouble breathing when resting. You have a dry cough that is getting worse. You have swelling or pain in your legs or abdomen that is getting worse. You suddenly gain more than 2-3 lb (0.9-1.4 kg) in 24 hours, or more than 5 lb (2.3 kg) in a week. This amount may be more or less depending on your condition. You have trouble staying  awake or you feel confused. You have chest pain. You do not have an appetite. You pass out. You have worsening sadness or depression. If you have any of these symptoms, call your local emergency services (911 in the [place] right away. Do not drive yourself to the hospital. Yellow zone These signs and symptoms mean your condition may be getting worse and you should make some changes: You have trouble breathing when you are active, or you need to sleep with your head raised on extra pillows to help you breathe. You have swelling in your legs or abdomen. You gain 2-3 lb (0.9-1.4 kg) in 24 hours, or 5 lb (2.3 kg) in a week. This amount may be more or less depending on your condition. You get tired easily. You have trouble sleeping. You have a dry cough. If you have any of these symptoms: Contact your health care provider within the next day. Your health care provider may adjust your medicines. Green zone These signs mean you are doing well and can continue what you are doing: You do not have shortness of breath. You have very little swelling or no new swelling. Your weight is stable (no gain or loss). You have a normal activity level. You do not have chest pain or any other new symptoms. Follow these instructions  at home: Take over-the-counter and prescription medicines only as told by your health care provider. Weigh yourself daily.  Call your health care provider if you gain more than 3 lb in 24 hours, or more than 5 lb  in a week. Eat a heart-healthy diet. Work with a diet and nutrition specialist (dietitian) to create an eating plan that is best for you. Keep all follow-up visits. This is important. Where to find more information American Heart Association: www.heart.org Summary A heart failure action plan helps you understand what to do when you have symptoms of heart failure. Follow the action plan that was created by you and your health care provider. Get help right away if you  have any symptoms in the red zone. This information is not intended to replace advice given to you by your health care provider. Make sure you discuss any questions you have with your health care provider. Document Revised: 05/12/2020 Document Reviewed: 05/12/2020 Elsevier Patient Education  2022 [place].   Patient verbalizes understanding of instructions provided today and agrees to view in [place].   Telephone follow up appointment with care management team member scheduled for: 09/21/21 The patient has been provided with contact information for the care management team and has been advised to call with any health related questions or concerns.   Thea Silversmith, RN, MSN, BSN, CCM Care Management Coordinator [facility] [phone]

## 2021-08-25 ENCOUNTER — Ambulatory Visit (INDEPENDENT_AMBULATORY_CARE_PROVIDER_SITE_OTHER): Payer: Medicare Other

## 2021-08-25 DIAGNOSIS — I428 Other cardiomyopathies: Secondary | ICD-10-CM

## 2021-08-25 LAB — CUP PACEART REMOTE DEVICE CHECK
Battery Remaining Longevity: 10 mo
Battery Voltage: 2.86 V
Brady Statistic AP VP Percent: 0 %
Brady Statistic AP VS Percent: 0 %
Brady Statistic AS VP Percent: 98.47 %
Brady Statistic AS VS Percent: 1.53 %
Brady Statistic RA Percent Paced: 0 %
Brady Statistic RV Percent Paced: 99.34 %
Date Time Interrogation Session: 20221115142747
Implantable Lead Implant Date: 20121128
Implantable Lead Implant Date: 20121128
Implantable Lead Location: 753858
Implantable Lead Location: 753860
Implantable Lead Model: 4194
Implantable Lead Model: 5076
Implantable Pulse Generator Implant Date: 20121128
Lead Channel Impedance Value: 342 Ohm
Lead Channel Impedance Value: 399 Ohm
Lead Channel Impedance Value: 4047 Ohm
Lead Channel Impedance Value: 4047 Ohm
Lead Channel Impedance Value: 418 Ohm
Lead Channel Impedance Value: 418 Ohm
Lead Channel Impedance Value: 437 Ohm
Lead Channel Impedance Value: 513 Ohm
Lead Channel Impedance Value: 589 Ohm
Lead Channel Pacing Threshold Amplitude: 0.625 V
Lead Channel Pacing Threshold Amplitude: 1.25 V
Lead Channel Pacing Threshold Pulse Width: 0.4 ms
Lead Channel Pacing Threshold Pulse Width: 0.4 ms
Lead Channel Sensing Intrinsic Amplitude: 11.625 mV
Lead Channel Sensing Intrinsic Amplitude: 11.625 mV
Lead Channel Setting Pacing Amplitude: 2 V
Lead Channel Setting Pacing Amplitude: 2.25 V
Lead Channel Setting Pacing Pulse Width: 0.4 ms
Lead Channel Setting Pacing Pulse Width: 0.4 ms
Lead Channel Setting Sensing Sensitivity: 4 mV

## 2021-08-29 ENCOUNTER — Emergency Department (HOSPITAL_COMMUNITY): Payer: Medicare Other

## 2021-08-29 ENCOUNTER — Encounter (HOSPITAL_COMMUNITY): Payer: Self-pay | Admitting: *Deleted

## 2021-08-29 ENCOUNTER — Other Ambulatory Visit: Payer: Self-pay

## 2021-08-29 ENCOUNTER — Inpatient Hospital Stay (HOSPITAL_COMMUNITY)
Admission: EM | Admit: 2021-08-29 | Discharge: 2021-09-02 | DRG: 190 | Disposition: A | Discharge status: Home / Self Care | Payer: Medicare Other | Attending: Family Medicine | Admitting: Family Medicine

## 2021-08-29 DIAGNOSIS — Z87891 Personal history of nicotine dependence: Secondary | ICD-10-CM | POA: Diagnosis not present

## 2021-08-29 DIAGNOSIS — I4510 Unspecified right bundle-branch block: Secondary | ICD-10-CM | POA: Diagnosis present

## 2021-08-29 DIAGNOSIS — J441 Chronic obstructive pulmonary disease with (acute) exacerbation: Secondary | ICD-10-CM | POA: Diagnosis not present

## 2021-08-29 DIAGNOSIS — I5033 Acute on chronic diastolic (congestive) heart failure: Secondary | ICD-10-CM | POA: Diagnosis not present

## 2021-08-29 DIAGNOSIS — Z7982 Long term (current) use of aspirin: Secondary | ICD-10-CM

## 2021-08-29 DIAGNOSIS — I10 Essential (primary) hypertension: Secondary | ICD-10-CM | POA: Diagnosis present

## 2021-08-29 DIAGNOSIS — N400 Enlarged prostate without lower urinary tract symptoms: Secondary | ICD-10-CM | POA: Diagnosis present

## 2021-08-29 DIAGNOSIS — I272 Pulmonary hypertension, unspecified: Secondary | ICD-10-CM | POA: Diagnosis present

## 2021-08-29 DIAGNOSIS — E1165 Type 2 diabetes mellitus with hyperglycemia: Secondary | ICD-10-CM | POA: Diagnosis present

## 2021-08-29 DIAGNOSIS — Z9981 Dependence on supplemental oxygen: Secondary | ICD-10-CM

## 2021-08-29 DIAGNOSIS — I2510 Atherosclerotic heart disease of native coronary artery without angina pectoris: Secondary | ICD-10-CM | POA: Diagnosis present

## 2021-08-29 DIAGNOSIS — Z9842 Cataract extraction status, left eye: Secondary | ICD-10-CM

## 2021-08-29 DIAGNOSIS — E119 Type 2 diabetes mellitus without complications: Secondary | ICD-10-CM | POA: Diagnosis not present

## 2021-08-29 DIAGNOSIS — E1151 Type 2 diabetes mellitus with diabetic peripheral angiopathy without gangrene: Secondary | ICD-10-CM | POA: Diagnosis present

## 2021-08-29 DIAGNOSIS — Z8249 Family history of ischemic heart disease and other diseases of the circulatory system: Secondary | ICD-10-CM | POA: Diagnosis not present

## 2021-08-29 DIAGNOSIS — Z950 Presence of cardiac pacemaker: Secondary | ICD-10-CM

## 2021-08-29 DIAGNOSIS — Z961 Presence of intraocular lens: Secondary | ICD-10-CM | POA: Diagnosis present

## 2021-08-29 DIAGNOSIS — Z885 Allergy status to narcotic agent status: Secondary | ICD-10-CM

## 2021-08-29 DIAGNOSIS — Z833 Family history of diabetes mellitus: Secondary | ICD-10-CM | POA: Diagnosis not present

## 2021-08-29 DIAGNOSIS — I5032 Chronic diastolic (congestive) heart failure: Secondary | ICD-10-CM

## 2021-08-29 DIAGNOSIS — J9612 Chronic respiratory failure with hypercapnia: Secondary | ICD-10-CM | POA: Diagnosis not present

## 2021-08-29 DIAGNOSIS — I110 Hypertensive heart disease with heart failure: Secondary | ICD-10-CM | POA: Diagnosis present

## 2021-08-29 DIAGNOSIS — I482 Chronic atrial fibrillation, unspecified: Secondary | ICD-10-CM | POA: Diagnosis not present

## 2021-08-29 DIAGNOSIS — Z20822 Contact with and (suspected) exposure to covid-19: Secondary | ICD-10-CM | POA: Diagnosis present

## 2021-08-29 DIAGNOSIS — T380X5A Adverse effect of glucocorticoids and synthetic analogues, initial encounter: Secondary | ICD-10-CM | POA: Diagnosis present

## 2021-08-29 DIAGNOSIS — Z79899 Other long term (current) drug therapy: Secondary | ICD-10-CM

## 2021-08-29 DIAGNOSIS — G4700 Insomnia, unspecified: Secondary | ICD-10-CM | POA: Diagnosis present

## 2021-08-29 DIAGNOSIS — E785 Hyperlipidemia, unspecified: Secondary | ICD-10-CM | POA: Diagnosis present

## 2021-08-29 DIAGNOSIS — I4891 Unspecified atrial fibrillation: Secondary | ICD-10-CM | POA: Diagnosis present

## 2021-08-29 DIAGNOSIS — Z9841 Cataract extraction status, right eye: Secondary | ICD-10-CM | POA: Diagnosis not present

## 2021-08-29 DIAGNOSIS — J9611 Chronic respiratory failure with hypoxia: Secondary | ICD-10-CM | POA: Diagnosis present

## 2021-08-29 DIAGNOSIS — Z888 Allergy status to other drugs, medicaments and biological substances status: Secondary | ICD-10-CM

## 2021-08-29 DIAGNOSIS — Z7984 Long term (current) use of oral hypoglycemic drugs: Secondary | ICD-10-CM

## 2021-08-29 DIAGNOSIS — R0600 Dyspnea, unspecified: Secondary | ICD-10-CM

## 2021-08-29 LAB — CBC
HCT: 35.3 % — ABNORMAL LOW (ref 39.0–52.0)
Hemoglobin: 11.6 g/dL — ABNORMAL LOW (ref 13.0–17.0)
MCH: 30.9 pg (ref 26.0–34.0)
MCHC: 32.9 g/dL (ref 30.0–36.0)
MCV: 94.1 fL (ref 80.0–100.0)
Platelets: 215 10*3/uL (ref 150–400)
RBC: 3.75 MIL/uL — ABNORMAL LOW (ref 4.22–5.81)
RDW: 13.6 % (ref 11.5–15.5)
WBC: 6.8 10*3/uL (ref 4.0–10.5)
nRBC: 0 % (ref 0.0–0.2)

## 2021-08-29 LAB — RESP PANEL BY RT-PCR (FLU A&B, COVID) ARPGX2
Influenza A by PCR: NEGATIVE
Influenza B by PCR: NEGATIVE
SARS Coronavirus 2 by RT PCR: NEGATIVE

## 2021-08-29 LAB — BASIC METABOLIC PANEL
Anion gap: 12 (ref 5–15)
BUN: 13 mg/dL (ref 8–23)
CO2: 26 mmol/L (ref 22–32)
Calcium: 8.3 mg/dL — ABNORMAL LOW (ref 8.9–10.3)
Chloride: 100 mmol/L (ref 98–111)
Creatinine, Ser: 0.86 mg/dL (ref 0.61–1.24)
GFR, Estimated: >60 mL/min (ref >60)
Glucose, Bld: 140 mg/dL — ABNORMAL HIGH (ref 70–99)
Potassium: 4 mmol/L (ref 3.5–5.1)
Sodium: 138 mmol/L (ref 135–145)

## 2021-08-29 LAB — TROPONIN I (HIGH SENSITIVITY)
Troponin I (High Sensitivity): 10 ng/L (ref <18)
Troponin I (High Sensitivity): 11 ng/L (ref <18)

## 2021-08-29 LAB — BRAIN NATRIURETIC PEPTIDE: B Natriuretic Peptide: 544.7 pg/mL — ABNORMAL HIGH (ref 0.0–100.0)

## 2021-08-29 MED ORDER — FUROSEMIDE 10 MG/ML IJ SOLN
60.0000 mg | Freq: Two times a day (BID) | INTRAMUSCULAR | Status: DC
  Administered 2021-08-30 – 2021-09-02 (×7): 60 mg via INTRAVENOUS
  Filled 2021-08-29 (×7): qty 6

## 2021-08-29 MED ORDER — ATORVASTATIN CALCIUM 10 MG PO TABS
20.0000 mg | ORAL_TABLET | Freq: Every day | ORAL | Status: DC
  Administered 2021-08-30 – 2021-09-01 (×3): 20 mg via ORAL
  Filled 2021-08-29 (×3): qty 2

## 2021-08-29 MED ORDER — AMLODIPINE BESYLATE 10 MG PO TABS
10.0000 mg | ORAL_TABLET | Freq: Every day | ORAL | Status: DC
  Administered 2021-08-30: 10 mg via ORAL
  Filled 2021-08-29: qty 2

## 2021-08-29 MED ORDER — SODIUM CHLORIDE 0.9% FLUSH
3.0000 mL | Freq: Two times a day (BID) | INTRAVENOUS | Status: DC
  Administered 2021-08-30 – 2021-09-02 (×6): 3 mL via INTRAVENOUS

## 2021-08-29 MED ORDER — PREDNISONE 20 MG PO TABS
40.0000 mg | ORAL_TABLET | Freq: Every day | ORAL | Status: DC
  Administered 2021-08-31 – 2021-09-02 (×3): 40 mg via ORAL
  Filled 2021-08-29 (×3): qty 2

## 2021-08-29 MED ORDER — ALBUTEROL SULFATE (2.5 MG/3ML) 0.083% IN NEBU
5.0000 mg | INHALATION_SOLUTION | RESPIRATORY_TRACT | Status: DC | PRN
Start: 2021-08-29 — End: 2021-08-30
  Administered 2021-08-29: 5 mg via RESPIRATORY_TRACT
  Filled 2021-08-29: qty 6

## 2021-08-29 MED ORDER — SODIUM CHLORIDE 0.9 % IV SOLN
1.0000 g | INTRAVENOUS | Status: DC
  Administered 2021-08-30 – 2021-09-01 (×3): 1 g via INTRAVENOUS
  Filled 2021-08-29 (×3): qty 10

## 2021-08-29 MED ORDER — CARVEDILOL 25 MG PO TABS
25.0000 mg | ORAL_TABLET | Freq: Two times a day (BID) | ORAL | Status: DC
  Administered 2021-08-30 – 2021-09-02 (×7): 25 mg via ORAL
  Filled 2021-08-29 (×4): qty 1
  Filled 2021-08-29: qty 2
  Filled 2021-08-29 (×2): qty 1

## 2021-08-29 MED ORDER — IPRATROPIUM BROMIDE 0.02 % IN SOLN
0.5000 mg | Freq: Once | RESPIRATORY_TRACT | Status: AC
  Administered 2021-08-29: 0.5 mg via RESPIRATORY_TRACT
  Filled 2021-08-29: qty 2.5

## 2021-08-29 MED ORDER — IPRATROPIUM-ALBUTEROL 0.5-2.5 (3) MG/3ML IN SOLN: 3.0000 mL | Freq: Three times a day (TID) | RESPIRATORY_TRACT | Status: DC

## 2021-08-29 MED ORDER — INSULIN ASPART 100 UNIT/ML IJ SOLN
0.0000 [IU] | Freq: Three times a day (TID) | INTRAMUSCULAR | Status: DC
  Administered 2021-08-30: 2 [IU] via SUBCUTANEOUS
  Administered 2021-08-30: 3 [IU] via SUBCUTANEOUS
  Administered 2021-08-30 – 2021-08-31 (×2): 2 [IU] via SUBCUTANEOUS

## 2021-08-29 MED ORDER — GUAIFENESIN ER 600 MG PO TB12
600.0000 mg | ORAL_TABLET | Freq: Two times a day (BID) | ORAL | Status: DC
  Administered 2021-08-30 – 2021-09-02 (×7): 600 mg via ORAL
  Filled 2021-08-29 (×7): qty 1

## 2021-08-29 MED ORDER — ACETAMINOPHEN 325 MG PO TABS
650.0000 mg | ORAL_TABLET | Freq: Four times a day (QID) | ORAL | Status: DC | PRN
  Administered 2021-08-30: 650 mg via ORAL
  Filled 2021-08-29: qty 2

## 2021-08-29 MED ORDER — ALBUTEROL SULFATE (2.5 MG/3ML) 0.083% IN NEBU
2.5000 mg | INHALATION_SOLUTION | RESPIRATORY_TRACT | Status: DC | PRN
  Filled 2021-08-29: qty 3

## 2021-08-29 MED ORDER — ACETAMINOPHEN 650 MG RE SUPP: 650.0000 mg | Freq: Four times a day (QID) | RECTAL | Status: DC | PRN

## 2021-08-29 MED ORDER — AZITHROMYCIN 250 MG PO TABS
500.0000 mg | ORAL_TABLET | Freq: Once | ORAL | Status: AC
  Administered 2021-08-30: 500 mg via ORAL
  Filled 2021-08-29: qty 2

## 2021-08-29 MED ORDER — METHYLPREDNISOLONE SODIUM SUCC 125 MG IJ SOLR
80.0000 mg | Freq: Two times a day (BID) | INTRAMUSCULAR | Status: AC
  Administered 2021-08-30 (×2): 80 mg via INTRAVENOUS
  Filled 2021-08-29 (×2): qty 2

## 2021-08-29 MED ORDER — ASPIRIN 81 MG PO CHEW
81.0000 mg | CHEWABLE_TABLET | Freq: Every morning | ORAL | Status: DC
  Administered 2021-08-30 – 2021-09-02 (×4): 81 mg via ORAL
  Filled 2021-08-29 (×4): qty 1

## 2021-08-29 MED ORDER — METHYLPREDNISOLONE SODIUM SUCC 125 MG IJ SOLR: 125.0000 mg | Freq: Once | INTRAMUSCULAR | Status: DC

## 2021-08-29 MED ORDER — LOSARTAN POTASSIUM 50 MG PO TABS
50.0000 mg | ORAL_TABLET | Freq: Every day | ORAL | Status: DC
  Administered 2021-08-30 – 2021-09-02 (×4): 50 mg via ORAL
  Filled 2021-08-29 (×4): qty 1

## 2021-08-29 MED ORDER — TAMSULOSIN HCL 0.4 MG PO CAPS
0.4000 mg | ORAL_CAPSULE | Freq: Every day | ORAL | Status: DC
  Administered 2021-08-30 – 2021-09-02 (×4): 0.4 mg via ORAL
  Filled 2021-08-29 (×4): qty 1

## 2021-08-29 MED ORDER — SENNOSIDES-DOCUSATE SODIUM 8.6-50 MG PO TABS: 1.0000 | ORAL_TABLET | Freq: Every evening | ORAL | Status: DC | PRN

## 2021-08-29 MED ORDER — SODIUM CHLORIDE 0.9 % IV SOLN
1.0000 g | Freq: Once | INTRAVENOUS | Status: AC
  Administered 2021-08-30: 1 g via INTRAVENOUS
  Filled 2021-08-29: qty 10

## 2021-08-29 MED ORDER — PANTOPRAZOLE SODIUM 40 MG PO TBEC
40.0000 mg | DELAYED_RELEASE_TABLET | Freq: Every day | ORAL | Status: DC
  Administered 2021-08-30 – 2021-09-02 (×4): 40 mg via ORAL
  Filled 2021-08-29 (×4): qty 1

## 2021-08-29 MED ORDER — ZOLPIDEM TARTRATE 5 MG PO TABS
5.0000 mg | ORAL_TABLET | Freq: Every day | ORAL | Status: DC
  Administered 2021-08-30 – 2021-09-01 (×3): 5 mg via ORAL
  Filled 2021-08-29 (×3): qty 1

## 2021-08-29 MED ORDER — ENOXAPARIN SODIUM 40 MG/0.4ML IJ SOSY
40.0000 mg | PREFILLED_SYRINGE | INTRAMUSCULAR | Status: DC
  Administered 2021-08-30 – 2021-09-01 (×4): 40 mg via SUBCUTANEOUS
  Filled 2021-08-29 (×4): qty 0.4

## 2021-08-29 NOTE — H&P (Signed)
History and Physical    Bradley [name] EQA:[id number] DOB: 1941/05/24 DOA: 08/29/2021  PCP: [name], MD   Patient coming from: Home   Chief Complaint: Increased SOB and cough   HPI: Bradley LAQUE Sr. is a pleasant 80 y.o. 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  Patient reports that he has been more dyspneic for the past 3 to 4 months, improved during the admission 1 month ago during which she was treated for acute COPD and acute CHF, and then remained fairly stable until experiencing increased cough and shortness of breath for the past 2 days.  He has been producing some clear phlegm.  He denies any leg swelling but has noticed some increased orthopnea.  Denies fevers.  He has been experiencing some fleeting improvement with neb treatments at home but worsened significantly tonight, called EMS, and was treated with 125 mg of IV Solu-Medrol prior to arrival in the ED.  ED Course: Upon arrival to the ED, patient is found to be saturating low to mid 90s on 3 L/min of supplemental oxygen.  EKG demonstrates atrial fibrillation with ventricular pacing and RBBB.  Chest x-ray with cardiomegaly and increased interstitial markings as well as small bilateral pleural effusions.  Chemistry panel and CBC largely unremarkable.  Troponin normal x2.  BNP elevated to 545.  COVID and influenza PCR are negative.  Patient was treated with albuterol, Atrovent, Rocephin, and azithromycin in the ED.  Review of Systems:  All other systems reviewed and apart from HPI, are negative.  Past Medical History:  Diagnosis Date   Abnormal CT scan, chest 2012   CT chest, several lymphadenopathies. Sees pulmonary    Anemia    intermittent   Arthritis    "?back" (09/19/2018)   Atrial fibrillation (HCC)    s/p AV node ablation & BiV PPM implantation 09/08/11 (op dictation pending)   BPH (benign prostatic hyperplasia)    Saw Dr [name] 2004, normal renal u/s   Bronchitis 08/26/2017   CAD (coronary artery disease)    CHF (congestive heart failure) (HCC)    Thought primarily to be non-systolic although EF down (EF 97-98% 12/2010, down to 35-40% 09/05/11), cath 2008 with no CAD, nuclear study 07/2011 showing Small area of reversibility in the distal ant/lat wall the left ventricle suspicious for ischemia/septal wall HK but felt to be low risk  (per D/C Summary 07/2011)   Chronic bronchitis (HCC)    "get it ~ q yr"   Chronic respiratory failure with hypoxia and hypercapnia (HCC) 09/14/2010   Followed in Pulmonary clinic/ [facility]/ [place]  Started on 02 2lpm at discharge 09/23/11   - PFT's 10/28/2011  FEV1  1.40 (51%)  with ratio 70 and no better p B2 and DLCO 53 corrects to 101    - PFT's 10/18/2014    FEV1 1.71 (59%) with ratio 68 and no sign change p B2 and DLCO 53 corrects to 85  -  HC03   07/28/20  = 33  -  HCO3   03/17/21     = 31     Heart murmur    HLD (hyperlipidemia)    HTN (hypertension)    ICB (intracranial bleed) (HCC) 06/2012   d/c coumadin permanently  Insomnia    Migraines    "very very rare"   On home oxygen therapy    "2L; 24/7" (09/19/2018)   Pacemaker    Peripheral vascular disease ([place])    ??   Pleural effusion 2008   S/p decortication   Pneumonia 08/02/2011   Pulmonary HTN ([place])    per cath 2008   Type II diabetes mellitus ([place]) 1999    Past Surgical History:  Procedure Laterality Date   BI-VENTRICULAR PACEMAKER INSERTION Left 09/08/2011   Procedure: BI-VENTRICULAR PACEMAKER INSERTION (CRT-P);  Surgeon: Evans Lance, MD;  Location: [facility] CATH LAB;  Service: Cardiovascular;  Laterality: Left;   CARDIAC CATHETERIZATION     "couple times; never had balloon or stent"  (09/19/2018)   CATARACT EXTRACTION W/ INTRAOCULAR LENS  IMPLANT, BILATERAL Bilateral 08/2018   COLONOSCOPY  03/10/11   normal   INSERT / REPLACE / REMOVE PACEMAKER  09/08/11   pacemaker placement   LUNG DECORTICATION     PLEURAL SCARIFICATION     pneumothorax with fibrothorax  ~ 2010   TONSILLECTOMY     "as a kid"     Social History:   reports that he quit smoking about 40 years ago. His smoking use included cigarettes and cigars. He has a 62.50 pack-year smoking history. He has never used smokeless tobacco. He reports that he does not currently use alcohol. He reports that he does not use drugs.  Allergies  Allergen Reactions   Hydrocodone Other (See Comments)    "given to him in the hospital; went thru withdrawals once home; dr said not to take it again" (09/27/2012)   Tramadol Other (See Comments)    "given to him in the hospital; went thru withdrawals once home; dr said not to take it again" (09/27/2012)   Tadalafil Other (See Comments)    headache, backache   Avelox [Moxifloxacin Hydrochloride] Itching and Other (See Comments)    Headache    Family History  Problem Relation Age of Onset   Diabetes Father    Coronary artery disease Father    Polycythemia Mother    Drug abuse Son    Breast cancer Maternal Aunt    Prostate cancer Neg Hx    Colon cancer Neg Hx      Prior to Admission medications   Medication Sig Start Date End Date Taking? Authorizing Provider  acetaminophen (TYLENOL) 500 MG tablet Take 500 mg by mouth every 6 (six) hours as needed for headache or moderate pain.   Yes [provider]  albuterol (VENTOLIN HFA) 108 (90 Base) MCG/ACT inhaler Inhale 2 puffs into the lungs every 6 (six) hours as needed for wheezing or shortness of breath. 03/11/21  Yes Ghimire, Henreitta Leber, MD  amLODipine (NORVASC) 10 MG tablet Take 0.5 tablets (5 mg total) by mouth daily. Patient taking differently: Take 10 mg by mouth daily. 08/03/21  Yes Eulogio Bear U, DO  aspirin  81 MG tablet Take 81 mg by mouth every morning.    Yes [provider]  atorvastatin (LIPITOR) 20 MG tablet TAKE 1 TABLET AT BEDTIME Patient taking differently: Take 20 mg by mouth at bedtime. 06/11/21  Yes Paz, Jacqulyn Bath E, MD  budesonide (PULMICORT) 0.5 MG/2ML nebulizer solution Take 2 mLs (0.5 mg total) by nebulization 2 (two) times daily. 11/26/19  Yes Paz, Alda Berthold, MD  calcium-vitamin D (OSCAL WITH D) 500-200 MG-UNIT tablet Take 1 tablet by mouth every morning.   Yes [provider]  carvedilol (COREG) 25 MG tablet  TAKE 1 TABLET TWICE A DAY WITH MEALS 05/22/21  Yes Paz, Alda Berthold, MD  cholecalciferol (VITAMIN D3) 25 MCG (1000 UNIT) tablet Take 1,000 Units by mouth daily.   Yes [provider]  diphenhydramine-acetaminophen (TYLENOL PM) 25-500 MG TABS tablet Take 1 tablet by mouth at bedtime as needed.   Yes [provider]  furosemide (LASIX) 40 MG tablet TAKE 2 TABLETS DAILY EXCEPT ON MONDAY, WEDNESDAY, AND FRIDAY TAKE 2 TABLETS IN THE MORNING AND 1 TABLET IN THE AFTERNOON Patient taking differently: Take 40-80 mg by mouth See admin instructions. Take 80 mg (2 tablets) daily except on Monday, Wednesday, and Friday. Taking 2 tablets in the morning and 1( 40 mg) tablet in the afternoon. 05/18/21  Yes Paz, Alda Berthold, MD  ipratropium-albuterol (DUONEB) 0.5-2.5 (3) MG/3ML SOLN Take 3 mLs by nebulization 2 (two) times daily as needed. Patient taking differently: Take 3 mLs by nebulization 2 (two) times daily as needed (shortness of breath). 11/26/19  Yes Paz, Alda Berthold, MD  losartan (COZAAR) 50 MG tablet Take 0.5 tablets (25 mg total) by mouth daily. Patient taking differently: Take 50 mg by mouth daily. 08/03/21  Yes Geradine Girt, DO  Multiple Vitamins-Minerals (MULTIVITAMINS THER. W/MINERALS) TABS tablet Take 1 tablet by mouth daily. 09/14/13  Yes Paz, Alda Berthold, MD  nitroGLYCERIN (NITROSTAT) 0.4 MG SL tablet Place 1 tablet (0.4 mg total) under the tongue every 5 (five) minutes x 3  doses as needed for chest pain. 01/10/19  Yes Paz, Alda Berthold, MD  pantoprazole (PROTONIX) 40 MG tablet Take 40 mg by mouth daily.   Yes [provider]  sitaGLIPtin (JANUVIA) 100 MG tablet TAKE 1 TABLET DAILY Patient taking differently: Take 100 mg by mouth daily. 07/27/21  Yes Paz, Alda Berthold, MD  tamsulosin (FLOMAX) 0.4 MG CAPS capsule TAKE 1 CAPSULE DAILY AFTER SUPPER Patient taking differently: Take 0.4 mg by mouth daily. 06/03/21  Yes Paz, Alda Berthold, MD  zolpidem (AMBIEN) 10 MG tablet Take 10 mg by mouth at bedtime.   Yes [provider]  guaiFENesin (MUCINEX) 600 MG 12 hr tablet Take 1 tablet (600 mg total) by mouth 2 (two) times daily. Patient not taking: Reported on 08/29/2021 08/03/21   Geradine Girt, DO  Lancets [facility] ULTRASOFT) lancets Check blood sugars no more than twice daily 03/08/17   Colon Branch, MD  loratadine (CLARITIN) 10 MG tablet Take 1 tablet (10 mg total) by mouth daily. 08/04/21   Geradine Girt, DO  ONETOUCH VERIO test strip USE TO CHECK BLOOD SUGAR NO MORE THAN TWICE A DAY 02/09/21   Colon Branch, MD    Physical Exam: Vitals:   08/29/21 1706 08/29/21 1715 08/29/21 1730  BP:  (!) 120/59 (!) 120/54  Pulse:  79 68  SpO2:  93% 95%  Weight: 86.5 kg    Height: 5\' 9"  (1.753 m)      Constitutional: NAD, calm  Eyes: PERTLA, lids and conjunctivae normal ENMT: Mucous membranes are moist. Posterior pharynx clear of any exudate or lesions.   Neck: supple, no masses  Respiratory: Diminished breath sounds bilaterally, fine rales at right base, no wheezing. No accessory muscle use.  Cardiovascular: Rate ~60. No extremity edema.  Abdomen: No distension, no tenderness, soft. Bowel sounds active.  Musculoskeletal: no clubbing / cyanosis. No joint deformity upper and lower extremities.   Skin: hyperpigmentation involving lower legs b/l. Warm, dry, well-perfused.  Neurologic: CN 2-12 grossly intact. Moving all extremities. Alert and oriented.  Psychiatric:  Pleasant.  Cooperative.    Labs and Imaging on Admission: I have personally reviewed following labs and imaging studies  CBC: Recent Labs  Lab 08/29/21 1705  WBC 6.8  HGB 11.6*  HCT 35.3*  MCV 94.1  PLT 123456   Basic Metabolic Panel: Recent Labs  Lab 08/29/21 1705  NA 138  K 4.0  CL 100  CO2 26  GLUCOSE 140*  BUN 13  CREATININE 0.86  CALCIUM 8.3*   GFR: Estimated Creatinine Clearance: 74.6 mL/min (by C-G formula based on SCr of 0.86 mg/dL). Liver Function Tests: No results for input(s): AST, ALT, ALKPHOS, BILITOT, PROT, ALBUMIN in the last 168 hours. No results for input(s): LIPASE, AMYLASE in the last 168 hours. No results for input(s): AMMONIA in the last 168 hours. Coagulation Profile: No results for input(s): INR, PROTIME in the last 168 hours. Cardiac Enzymes: No results for input(s): CKTOTAL, CKMB, CKMBINDEX, TROPONINI in the last 168 hours. BNP (last 3 results) No results for input(s): PROBNP in the last 8760 hours. HbA1C: No results for input(s): HGBA1C in the last 72 hours. CBG: No results for input(s): GLUCAP in the last 168 hours. Lipid Profile: No results for input(s): CHOL, HDL, LDLCALC, TRIG, CHOLHDL, LDLDIRECT in the last 72 hours. Thyroid Function Tests: No results for input(s): TSH, T4TOTAL, FREET4, T3FREE, THYROIDAB in the last 72 hours. Anemia Panel: No results for input(s): VITAMINB12, FOLATE, FERRITIN, TIBC, IRON, RETICCTPCT in the last 72 hours. Urine analysis:    Component Value Date/Time   COLORURINE YELLOW 07/15/2015 1440   APPEARANCEUR CLEAR 07/15/2015 1440   LABSPEC 1.015 07/15/2015 1440   PHURINE 6.0 07/15/2015 1440   GLUCOSEU NEGATIVE 07/15/2015 1440   HGBUR TRACE-LYSED (A) 07/15/2015 1440   BILIRUBINUR NEGATIVE 07/15/2015 1440   KETONESUR TRACE (A) 07/15/2015 1440   PROTEINUR NEGATIVE 07/06/2012 0341   UROBILINOGEN 1.0 07/15/2015 1440   NITRITE NEGATIVE 07/15/2015 1440   LEUKOCYTESUR NEGATIVE 07/15/2015 1440   Sepsis  Labs: @LABRCNTIP (procalcitonin:4,lacticidven:4) ) Recent Results (from the past 240 hour(s))  Resp Panel by RT-PCR (Flu A&B, Covid) Nasopharyngeal Swab     Status: None   Collection Time: 08/29/21  5:06 PM   Specimen: Nasopharyngeal Swab; Nasopharyngeal(NP) swabs in vial transport medium  Result Value Ref Range Status   SARS Coronavirus 2 by RT PCR NEGATIVE NEGATIVE Final    Comment: (NOTE) SARS-CoV-2 target nucleic acids are NOT DETECTED.  The SARS-CoV-2 RNA is generally detectable in upper respiratory specimens during the acute phase of infection. The lowest concentration of SARS-CoV-2 viral copies this assay can detect is 138 copies/mL. A negative result does not preclude SARS-Cov-2 infection and should not be used as the sole basis for treatment or other patient management decisions. A negative result may occur with  improper specimen collection/handling, submission of specimen other than nasopharyngeal swab, presence of viral mutation(s) within the areas targeted by this assay, and inadequate number of viral copies(<138 copies/mL). A negative result must be combined with clinical observations, patient history, and epidemiological information. The expected result is Negative.  Fact Sheet for Patients:  EntrepreneurPulse.com.au  Fact Sheet for Healthcare Providers:  IncredibleEmployment.be  This test is no t yet approved or cleared by the [place] FDA and  has been authorized for detection and/or diagnosis of SARS-CoV-2 by FDA under an Emergency Use Authorization (EUA). This EUA will remain  in effect (meaning this test can be used) for the duration of the COVID-19 declaration under Section 564(b)(1) of the Act, 21 U.S.C.section 360bbb-3(b)(1), unless the authorization is  terminated  or revoked sooner.       Influenza A by PCR NEGATIVE NEGATIVE Final   Influenza B by PCR NEGATIVE NEGATIVE Final    Comment: (NOTE) The Xpert Xpress  SARS-CoV-2/FLU/RSV plus assay is intended as an aid in the diagnosis of influenza from Nasopharyngeal swab specimens and should not be used as a sole basis for treatment. Nasal washings and aspirates are unacceptable for Xpert Xpress SARS-CoV-2/FLU/RSV testing.  Fact Sheet for Patients: BloggerCourse.com  Fact Sheet for Healthcare Providers: SeriousBroker.it  This test is not yet approved or cleared by the [place] FDA and has been authorized for detection and/or diagnosis of SARS-CoV-2 by FDA under an Emergency Use Authorization (EUA). This EUA will remain in effect (meaning this test can be used) for the duration of the COVID-19 declaration under Section 564(b)(1) of the Act, 21 U.S.C. section 360bbb-3(b)(1), unless the authorization is terminated or revoked.  Performed at [facility], [address] [place], [place] [address]      Radiological Exams on Admission: DG Chest Port 1 View  Result Date: 08/29/2021 CLINICAL DATA:  Shortness of breath EXAM: PORTABLE CHEST 1 VIEW COMPARISON:  07/29/2021 FINDINGS: Transverse diameter of heart is increased. Increased interstitial markings are seen in the parahilar regions and lower lung fields with worsening. There is blunting of both lateral CP angles. There is no pneumothorax. IMPRESSION: Cardiomegaly. Increased interstitial markings are seen in the parahilar regions and lower lung fields with interval worsening. This may suggest interstitial edema or interstitial pneumonia. Small bilateral pleural effusions. Electronically Signed   By: Ernie Avena M.D.   On: 08/29/2021 18:27    EKG: Independently reviewed. Atrial fibrillation, ventricular pacing, RBBB.    Assessment/Plan   1. Acute on chronic diastolic CHF  - Presents with increased SOB and cough, also reports recent orthopnea, has BNP higher than prior and question of interstitial edema on CXR  - EF was 55-60% on  TTE from 07/30/21  - Diurese with 60 mg IV Lasix q12h, follow daily wt and I/Os, monitor renal function and electrolytes    2. COPD with acute exacerbation; chronic hypoxic & hypercarbic respiratory failure  - Presents with increased SOB and cough, improving some with bronchodilators, also has evidence of acute CHF on ED workup  - Treated with IV Solu-Medrol by EMS pta, given nebs and antibiotics in ED  - Check sputum culture and procalcitonin, continue systemic steroid and antibiotic, schedule SAMA/SABA and continue prn SABA, continue supplemental O2    3. Atrial fibrillation  - Not anticoagulated d/t hx of ICH  - Continue Coreg and aspirin    4. Hypertension  - BP at goal, continue Norvasc, Coreg, losartan    5. Type II DM  - A1c was 5.9% in October 2022   - Check CBGs and use low-intensity SSI      DVT prophylaxis: Lovenox  Code Status: Full, discussed with patient in ED  Level of Care: Level of care: Telemetry Cardiac Family Communication: none present  Disposition Plan:  Patient is from: home  Anticipated d/c is to: TBD Anticipated d/c date is: 09/01/21 Patient currently: Pending improvement in respiratory status  Consults called: none  Admission status: Inpatient     Briscoe Deutscher, MD [facility]  08/29/2021, 9:54 PM

## 2021-08-29 NOTE — ED Triage Notes (Signed)
The pt arrived by gems from home with sob for 2-3 days  he is on home 02 3 lioters  his sats were low when ems arrived  he has  hhns at home also  hhn given by gems on the way here

## 2021-08-29 NOTE — ED Notes (Signed)
The pt had solu medrol  125 mg by gems on the way here iv

## 2021-08-29 NOTE — ED Provider Notes (Signed)
[facility] EMERGENCY DEPARTMENT Provider Note   CSN: [id number] Arrival date & time: 08/29/21  1643     History Chief Complaint  Patient presents with   Shortness of Breath    Bradley LARDY Sr. is a 80 y.o. male.  HPI   Pt started feeling short of breath a few days ago.  He has been coughing and congested, feeling more short of breath.  No fevers.  No chest pain or leg swelling.  He has been doing his breathing treatment but the sx persist.  Pt does have history of copd.  He has been admitted in the past.  He normally is on 3l [place].  He no longer smokes.  Patient does have history of COPD.  His last admission to the hospital was on October 19.  He states he has been feeling well since he was discharged from the hospital until this recent exacerbation.  Past Medical History:  Diagnosis Date   Abnormal CT scan, chest 2012   CT chest, several lymphadenopathies. Sees pulmonary   Anemia    intermittent   Arthritis    "?back" (09/19/2018)   Atrial fibrillation (HCC)    s/p AV node ablation & BiV PPM implantation 09/08/11 (op dictation pending)   BPH (benign prostatic hyperplasia)    Saw Dr Reece Agar 2004, normal renal u/s   Bronchitis 08/26/2017   CAD (coronary artery disease)    CHF (congestive heart failure) ([place])    Thought primarily to be non-systolic although EF down (EF 45-50% 12/2010, down to 35-40% 09/05/11), cath 2008 with no CAD, nuclear study 07/2011 showing Small area of reversibility in the distal ant/lat wall the left ventricle suspicious for ischemia/septal wall HK but felt to be low risk  (per D/C Summary 07/2011)   Chronic bronchitis ([place])    "get it ~ q yr"   Chronic respiratory failure with hypoxia and hypercapnia ([place]) 09/14/2010   Followed in Pulmonary clinic/ [facility]/ [place]  Started on 02 2lpm at discharge 09/23/11   - PFT's 10/28/2011  FEV1  1.40 (51%)  with ratio 70 and no better p B2 and DLCO 53 corrects to 101    - PFT's 10/18/2014     FEV1 1.71 (59%) with ratio 68 and no sign change p B2 and DLCO 53 corrects to 85  -  HC03   07/28/20  = 33  -  HCO3   03/17/21     = 31     Heart murmur    HLD (hyperlipidemia)    HTN (hypertension)    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   Pleural effusion 2008   S/p decortication   Pneumonia 08/02/2011   Pulmonary HTN ([place])    per cath 2008   Type II diabetes mellitus ([place]) 1999    Patient Active Problem List   Diagnosis Date Noted   GERD without esophagitis 07/30/2021   Acute on chronic systolic CHF (congestive heart failure) ([place]) 07/29/2021   Acute on chronic respiratory failure with hypoxia ([place]) 03/05/2021   Cor pulmonale (chronic) ([place]) 08/05/2020   COPD with acute exacerbation ([place]) 12/22/2018   COPD GOLD 0 spirometry/ AB  12/22/2018   PCP NOTES >>> 07/15/2015   Annual physical exam 05/08/2012  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   Sinoatrial node dysfunction (HCC)    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   Chronic diastolic (congestive) heart failure (HCC)    PAD (peripheral artery disease) (HCC) 10/22/2010   DERMATITIS, STASIS 09/14/2010   Chronic pulmonary hypertension secondary to elevated L H pressures  04/15/2009   ERECTILE DYSFUNCTION 01/06/2009   Mixed diabetic hyperlipidemia associated with type 2 diabetes mellitus (HCC) 12/13/2007   Essential hypertension 12/13/2007   Anxiety--insomnia 08/14/2007   Type 2 diabetes mellitus without complication, without long-term current use of insulin (HCC) 01/02/2007   Chronic atrial fibrillation (HCC) 01/02/2007   Benign enlargement of prostate 01/02/2007    Past Surgical History:  Procedure Laterality Date   BI-VENTRICULAR PACEMAKER INSERTION Left 09/08/2011   Procedure: BI-VENTRICULAR PACEMAKER INSERTION (CRT-P);  Surgeon: Marinus Maw, MD;  Location: [facility] CATH LAB;  Service: Cardiovascular;  Laterality: Left;   CARDIAC CATHETERIZATION     "couple times; never had balloon or stent" (09/19/2018)   CATARACT EXTRACTION W/ INTRAOCULAR LENS  IMPLANT, BILATERAL Bilateral 08/2018   COLONOSCOPY  03/10/11   normal   INSERT / REPLACE / REMOVE PACEMAKER  09/08/11   pacemaker placement   LUNG DECORTICATION     PLEURAL SCARIFICATION     pneumothorax with fibrothorax  ~ 2010   TONSILLECTOMY     "as a kid"        Family History  Problem Relation Age of Onset   Diabetes Father    Coronary artery disease Father    Polycythemia Mother    Drug abuse Son    Breast cancer Maternal Aunt    Prostate cancer Neg Hx    Colon cancer Neg Hx     Social History   Tobacco Use   Smoking status: Former    Packs/day: 2.50    Years: 25.00    Pack years: 62.50    Types: Cigarettes, Cigars    Quit date: 10/11/1980    Years since quitting: 40.9   Smokeless tobacco: Never  Vaping Use   Vaping Use: Never used  Substance Use Topics   Alcohol use: Not Currently    Comment: 1 can of beer ever 4-5 months   Drug use: No    Home Medications Prior to Admission medications   Medication Sig Start Date End Date Taking? Authorizing Provider  acetaminophen (TYLENOL) 500 MG tablet Take 500 mg by mouth every 6 (six) hours as needed for headache.    [provider]  albuterol (VENTOLIN HFA) 108 (90 Base) MCG/ACT inhaler Inhale 2 puffs into the lungs every 6 (six) hours as needed for wheezing or shortness of breath. 03/11/21   Ghimire, Werner Lean, MD  amLODipine (NORVASC) 10 MG tablet Take 0.5 tablets (5 mg total) by mouth daily. 08/03/21   Joseph Art, DO  aspirin 81 MG tablet Take 81 mg by mouth every morning.     [provider]  atorvastatin (LIPITOR) 20 MG tablet TAKE 1 TABLET AT BEDTIME Patient taking differently: Take 20 mg by mouth at bedtime. 06/11/21   [name], MD  budesonide (PULMICORT) 0.5 MG/2ML nebulizer  solution Take 2 mLs (0.5 mg total) by nebulization 2 (two) times daily. 11/26/19   [name], MD  calcium-vitamin D (OSCAL WITH D) 500-200 MG-UNIT tablet Take 1 tablet by mouth every morning.    [provider]  carvedilol (COREG) 25 MG tablet TAKE 1 TABLET TWICE A DAY WITH MEALS 05/22/21   [name], MD  cholecalciferol (VITAMIN D3) 25  MCG (1000 UNIT) tablet Take 1,000 Units by mouth daily.    [provider]  diphenhydramine-acetaminophen (TYLENOL PM) 25-500 MG TABS tablet Take 1 tablet by mouth at bedtime as needed.    [provider]  furosemide (LASIX) 40 MG tablet TAKE 2 TABLETS DAILY EXCEPT ON MONDAY, WEDNESDAY, AND FRIDAY TAKE 2 TABLETS IN THE MORNING AND 1 TABLET IN THE AFTERNOON Patient taking differently: Take 40-80 mg by mouth See admin instructions. Take 80 mg (2 tablets) daily except on Monday, Wednesday, and Friday. Taking 2 tablets in the morning and 1( 40 mg) tablet in the afternoon. 05/18/21   Colon Branch, MD  guaiFENesin (MUCINEX) 600 MG 12 hr tablet Take 1 tablet (600 mg total) by mouth 2 (two) times daily. 08/03/21   Geradine Girt, DO  ipratropium-albuterol (DUONEB) 0.5-2.5 (3) MG/3ML SOLN Take 3 mLs by nebulization 2 (two) times daily as needed. Patient taking differently: Take 3 mLs by nebulization 2 (two) times daily as needed (shortness of breath). 11/26/19   Colon Branch, MD  Lancets [facility] ULTRASOFT) lancets Check blood sugars no more than twice daily 03/08/17   Colon Branch, MD  loratadine (CLARITIN) 10 MG tablet Take 1 tablet (10 mg total) by mouth daily. 08/04/21   Geradine Girt, DO  losartan (COZAAR) 50 MG tablet Take 0.5 tablets (25 mg total) by mouth daily. 08/03/21   Geradine Girt, DO  Multiple Vitamins-Minerals (MULTIVITAMINS THER. W/MINERALS) TABS tablet Take 1 tablet by mouth daily. 09/14/13   Colon Branch, MD  nitroGLYCERIN (NITROSTAT) 0.4 MG SL tablet Place 1 tablet (0.4 mg total) under the tongue every 5 (five) minutes x 3 doses as  needed for chest pain. 01/10/19   Colon Branch, MD  [facility] VERIO test strip USE TO CHECK BLOOD SUGAR NO MORE THAN TWICE A DAY 02/09/21   Colon Branch, MD  pantoprazole (PROTONIX) 40 MG tablet Take 40 mg by mouth daily.    [provider]  sitaGLIPtin (JANUVIA) 100 MG tablet TAKE 1 TABLET DAILY Patient taking differently: Take 100 mg by mouth daily. 07/27/21   Colon Branch, MD  tamsulosin (FLOMAX) 0.4 MG CAPS capsule TAKE 1 CAPSULE DAILY AFTER SUPPER Patient taking differently: Take 0.4 mg by mouth daily after supper. 06/03/21   Colon Branch, MD    Allergies    Hydrocodone, Tramadol, Tadalafil, and Avelox [moxifloxacin hydrochloride]  Review of Systems   Review of Systems  All other systems reviewed and are negative.  Physical Exam Updated Vital Signs BP (!) 120/54   Pulse 68   Ht 1.753 m (5\' 9" )   Wt 86.5 kg   SpO2 95%   BMI 28.16 kg/m   Physical Exam Vitals and nursing note reviewed.  Constitutional:      Appearance: He is well-developed. He is not diaphoretic.  HENT:     Head: Normocephalic and atraumatic.     Right Ear: External ear normal.     Left Ear: External ear normal.  Eyes:     General: No scleral icterus.       Right eye: No discharge.        Left eye: No discharge.     Conjunctiva/sclera: Conjunctivae normal.  Neck:     Trachea: No tracheal deviation.  Cardiovascular:     Rate and Rhythm: Normal rate and regular rhythm.  Pulmonary:     Effort: Pulmonary effort is normal. Tachypnea present. No respiratory distress.     Breath sounds:  No stridor. Wheezing present. No rales.     Comments: Able to speak in full sentences Abdominal:     General: Bowel sounds are normal. There is no distension.     Palpations: Abdomen is soft.     Tenderness: There is no abdominal tenderness. There is no guarding or rebound.  Musculoskeletal:        General: No tenderness or deformity.     Cervical back: Neck supple.  Skin:    General: Skin is warm and dry.      Findings: No rash.  Neurological:     General: No focal deficit present.     Mental Status: He is alert.     Cranial Nerves: No cranial nerve deficit (no facial droop, extraocular movements intact, no slurred speech).     Sensory: No sensory deficit.     Motor: No abnormal muscle tone or seizure activity.     Coordination: Coordination normal.  Psychiatric:        Mood and Affect: Mood normal.    ED Results / Procedures / Treatments   Labs (all labs ordered are listed, but only abnormal results are displayed) Labs Reviewed  CBC - Abnormal; Notable for the following components:      Result Value   RBC 3.75 (*)    Hemoglobin 11.6 (*)    HCT 35.3 (*)    All other components within normal limits  BASIC METABOLIC PANEL - Abnormal; Notable for the following components:   Glucose, Bld 140 (*)    Calcium 8.3 (*)    All other components within normal limits  BRAIN NATRIURETIC PEPTIDE - Abnormal; Notable for the following components:   B Natriuretic Peptide 544.7 (*)    All other components within normal limits  RESP PANEL BY RT-PCR (FLU A&B, COVID) ARPGX2  TROPONIN I (HIGH SENSITIVITY)  TROPONIN I (HIGH SENSITIVITY)    EKG EKG Interpretation  Date/Time:  Saturday August 29 2021 19:54:14 EST Ventricular Rate:  79 PR Interval:  44 QRS Duration: 139 QT Interval:  417 QTC Calculation: 478 R Axis:   270 Text Interpretation: Electronic ventricular pacemaker Right bundle branch block Abnormal lateral Q waves Probable anterior infarct, old Confirmed by Dorie Rank [id number]) on 08/29/2021 7:57:14 PM  Radiology DG Chest Port 1 View  Result Date: 08/29/2021 CLINICAL DATA:  Shortness of breath EXAM: PORTABLE CHEST 1 VIEW COMPARISON:  07/29/2021 FINDINGS: Transverse diameter of heart is increased. Increased interstitial markings are seen in the parahilar regions and lower lung fields with worsening. There is blunting of both lateral CP angles. There is no pneumothorax. IMPRESSION:  Cardiomegaly. Increased interstitial markings are seen in the parahilar regions and lower lung fields with interval worsening. This may suggest interstitial edema or interstitial pneumonia. Small bilateral pleural effusions. Electronically Signed   By: Elmer Picker M.D.   On: 08/29/2021 18:27    Procedures Procedures   Medications Ordered in ED Medications  methylPREDNISolone sodium succinate (SOLU-MEDROL) 125 mg/2 mL injection 125 mg (125 mg Intravenous Not Given 08/29/21 1728)  albuterol (PROVENTIL) (2.5 MG/3ML) 0.083% nebulizer solution 5 mg (5 mg Nebulization Given 08/29/21 1748)  cefTRIAXone (ROCEPHIN) 1 g in sodium chloride 0.9 % 100 mL IVPB (has no administration in time range)  azithromycin (ZITHROMAX) tablet 500 mg (has no administration in time range)  ipratropium (ATROVENT) nebulizer solution 0.5 mg (0.5 mg Nebulization Given 08/29/21 1748)    ED Course  I have reviewed the triage vital signs and the nursing notes.  Pertinent labs &  imaging results that were available during my care of the patient were reviewed by me and considered in my medical decision making (see chart for details).  Clinical Course as of 08/29/21 2009  Sat Aug 29, 2021  1901 Chest x-ray shows possible interstitial edema versus interstitial pneumonia [JK]    Clinical Course User Index [JK] Dorie Rank, MD   MDM Rules/Calculators/A&P                           Patient's ED work-up shows possible pneumonia on x-ray.  COVID and flu are negative.  CBC and metabolic panel unremarkable.  BNP is slightly elevated.  Patient most likely has COPD exacerbation.  I favor infectious etiology as opposed to CHF with his presentation.  He was treated with albuterol treatments and steroids.  He is noted some improvement but is still wheezing and not quite feeling at his baseline.  With his comorbidities we will start him on antibiotics to cover for bacterial infection.  I will consult medical service for  admission Final Clinical Impression(s) / ED Diagnoses Final diagnoses:  COPD exacerbation ([place])     Dorie Rank, MD 08/29/21 2009

## 2021-08-30 LAB — BASIC METABOLIC PANEL
Anion gap: 8 (ref 5–15)
BUN: 17 mg/dL (ref 8–23)
CO2: 26 mmol/L (ref 22–32)
Calcium: 8.7 mg/dL — ABNORMAL LOW (ref 8.9–10.3)
Chloride: 102 mmol/L (ref 98–111)
Creatinine, Ser: 0.91 mg/dL (ref 0.61–1.24)
GFR, Estimated: >60 mL/min (ref >60)
Glucose, Bld: 254 mg/dL — ABNORMAL HIGH (ref 70–99)
Potassium: 4.3 mmol/L (ref 3.5–5.1)
Sodium: 136 mmol/L (ref 135–145)

## 2021-08-30 LAB — PROCALCITONIN: Procalcitonin: <0.1 ng/mL

## 2021-08-30 LAB — GLUCOSE, CAPILLARY
Glucose-Capillary: 189 mg/dL — ABNORMAL HIGH (ref 70–99)
Glucose-Capillary: 250 mg/dL — ABNORMAL HIGH (ref 70–99)

## 2021-08-30 LAB — CBC
HCT: 37.4 % — ABNORMAL LOW (ref 39.0–52.0)
Hemoglobin: 12.4 g/dL — ABNORMAL LOW (ref 13.0–17.0)
MCH: 30.8 pg (ref 26.0–34.0)
MCHC: 33.2 g/dL (ref 30.0–36.0)
MCV: 92.8 fL (ref 80.0–100.0)
Platelets: 201 10*3/uL (ref 150–400)
RBC: 4.03 MIL/uL — ABNORMAL LOW (ref 4.22–5.81)
RDW: 13.5 % (ref 11.5–15.5)
WBC: 5.2 10*3/uL (ref 4.0–10.5)
nRBC: 0 % (ref 0.0–0.2)

## 2021-08-30 LAB — CBG MONITORING, ED
Glucose-Capillary: 175 mg/dL — ABNORMAL HIGH (ref 70–99)
Glucose-Capillary: 213 mg/dL — ABNORMAL HIGH (ref 70–99)

## 2021-08-30 LAB — MAGNESIUM: Magnesium: 2.3 mg/dL (ref 1.7–2.4)

## 2021-08-30 MED ORDER — IPRATROPIUM-ALBUTEROL 0.5-2.5 (3) MG/3ML IN SOLN
3.0000 mL | Freq: Three times a day (TID) | RESPIRATORY_TRACT | Status: DC
  Administered 2021-08-30 – 2021-09-02 (×9): 3 mL via RESPIRATORY_TRACT
  Filled 2021-08-30 (×9): qty 3

## 2021-08-30 NOTE — Progress Notes (Signed)
PROGRESS NOTE    Bradley [name]  [id number] DOB: May 07, 1941 DOA: 08/29/2021 PCP: Colon Branch, MD    Chief Complaint  Patient presents with   Shortness of Breath    Brief Narrative:  Patient is a 80 y.o. male history of COPD on 2-3 L of oxygen at home, atrial fibrillation not on anticoagulation, history of PPM insertion/AV node ablation in 0000000, chronic diastolic heart failure-presented with shortness of breath-found to have acute on chronic hypoxic respiratory failure due to combination of  COPD exacerbation and decompensated heart failure,  He was treated for the same in 03/2021 and 07/2021  Subjective:  Congested cough DeNIES CHEST PAIN.  Does report pressure in the middle of the chest when he coughs too much, no significant lower extremity edema on exam Report quit smoking 40 years ago  Assessment & Plan:   Principal Problem:   COPD exacerbation ([place]) Active Problems:   Type 2 diabetes mellitus without complication, without long-term current use of insulin (HCC)   Essential hypertension   Chronic pulmonary hypertension secondary to elevated L H pressures    Chronic atrial fibrillation (HCC)   Chronic respiratory failure with hypoxia and hypercapnia (HCC)   Acute on chronic diastolic CHF (congestive heart failure) (HCC)  Acute on chronic hypoxic respiratory failure Likely from combination of diastolic CHF exacerbation and COPD exacerbation On 5 L currently, report on 2 to 3 L at baseline  Acute on chronic diastolic CHF Does has bibasilar crackles on exam, chest x-ray showed pulmonary edema and mild pleural effusion Continue IV Lasix, continue Coreg Strict intake and output, daily weight  COPD exacerbation -Reviewed recent CTA done in 10/20, there were mucous plugging bilateral lower lobes with associated atelectasis -Continue steroid, nebulizer, Mucinex, antibiotics -Start flutter valve/chest PT -Reportedly follows pulmonologist Dr. Melvyn Novas   atrial fibrillation  not on anticoagulation due to history of ICH,  history of PPM insertion/AV node ablation in 2012, paced rhythm Continue Coreg  Hypertension Continue Coreg, losartan On increased dose Lasix, hold Norvasc  Noninsulin-dependent type 2 diabetes, with hyperglycemia On Januvia at home Hyperglycemia likely due to steroid On SSI    The patient's BMI is: Body mass index is 28.16 kg/m.Marland Kitchen      Unresulted Labs (From admission, onward)     Start     Ordered   09/05/21 0500  Creatinine, serum  (enoxaparin (LOVENOX)    CrCl >/= 30 ml/min)  Weekly,   R     Comments: while on enoxaparin therapy    08/29/21 2141   08/30/21 [id number]  Basic metabolic panel  Daily,   R      08/29/21 2141   08/30/21 0500  CBC  Daily,   R      08/29/21 2141   08/30/21 0500  Procalcitonin  Daily,   R      08/29/21 2141   08/29/21 2142  Procalcitonin - Baseline  Once,   R        08/29/21 2141   08/29/21 2138  Expectorated Sputum Assessment w Gram Stain, Rflx to Resp Cult  (COPD / Pneumonia / Cellulitis / Lower Extremity Wound)  Once,   R        08/29/21 2141              DVT prophylaxis: enoxaparin (LOVENOX) injection 40 mg Start: 08/29/21 2145   Code Status: Full Family Communication: Patient Disposition:   Status is: Inpatient   Dispo: The patient is from: Home  Anticipated d/c is to: Home              Anticipated d/c date is: Greater than 48 hours, need to wean oxygen                Consultants:  None  Procedures:  none  Antimicrobials:   Anti-infectives (From admission, onward)    Start     Dose/Rate Route Frequency Ordered Stop   08/30/21 2015  cefTRIAXone (ROCEPHIN) 1 g in sodium chloride 0.9 % 100 mL IVPB        1 g 200 mL/hr over 30 Minutes Intravenous Every 24 hours 08/29/21 2141 09/04/21 2014   08/29/21 2015  cefTRIAXone (ROCEPHIN) 1 g in sodium chloride 0.9 % 100 mL IVPB        1 g 200 mL/hr over 30 Minutes Intravenous  Once 08/29/21 2008 08/30/21 0353   08/29/21  2015  azithromycin (ZITHROMAX) tablet 500 mg        500 mg Oral  Once 08/29/21 2008 08/30/21 0544           Objective: Vitals:   08/29/21 1730 08/30/21 0235 08/30/21 0600 08/30/21 0939  BP: (!) 120/54 (!) 111/59 129/74 116/76  Pulse: 68 70 71 90  Resp:  (!) 23 (!) 22 (!) 26  SpO2: 95% 90% 91% 92%  Weight:      Height:        Intake/Output Summary (Last 24 hours) at 08/30/2021 1125 Last data filed at 08/30/2021 0743 Gross per 24 hour  Intake 220 ml  Output 500 ml  Net -280 ml   Filed Weights   08/29/21 1706  Weight: 86.5 kg    Examination:  General exam: Tachypnea , mild accessory muscle use , able to finish sentences most of the times ,alert, awake, communicative,calm, Respiratory system:  currently no wheezing, does has rhonchi, bibasilar crackles, slight tachypnea, mild accessory muscle use Cardiovascular system:  RRR.  Gastrointestinal system: Abdomen is nondistended, soft and nontender.  Normal bowel sounds heard. Central nervous system: Alert and oriented. No focal neurological deficits. Extremities:  no edema Skin: No rashes, lesions or ulcers Psychiatry: Judgement and insight appear normal. Mood & affect appropriate.     Data Reviewed: I have personally reviewed following labs and imaging studies  CBC: Recent Labs  Lab 08/29/21 1705 08/30/21 0303  WBC 6.8 5.2  HGB 11.6* 12.4*  HCT 35.3* 37.4*  MCV 94.1 92.8  PLT 215 201    Basic Metabolic Panel: Recent Labs  Lab 08/29/21 1705 08/30/21 0303  NA 138 136  K 4.0 4.3  CL 100 102  CO2 26 26  GLUCOSE 140* 254*  BUN 13 17  CREATININE 0.86 0.91  CALCIUM 8.3* 8.7*  MG  --  2.3    GFR: Estimated Creatinine Clearance: 70.5 mL/min (by C-G formula based on SCr of 0.91 mg/dL).  Liver Function Tests: No results for input(s): AST, ALT, ALKPHOS, BILITOT, PROT, ALBUMIN in the last 168 hours.  CBG: Recent Labs  Lab 08/30/21 0800  GLUCAP 175*     Recent Results (from the past 240 hour(s))   Resp Panel by RT-PCR (Flu A&B, Covid) Nasopharyngeal Swab     Status: None   Collection Time: 08/29/21  5:06 PM   Specimen: Nasopharyngeal Swab; Nasopharyngeal(NP) swabs in vial transport medium  Result Value Ref Range Status   SARS Coronavirus 2 by RT PCR NEGATIVE NEGATIVE Final    Comment: (NOTE) SARS-CoV-2 target nucleic acids are NOT DETECTED.  The SARS-CoV-2 RNA  is generally detectable in upper respiratory specimens during the acute phase of infection. The lowest concentration of SARS-CoV-2 viral copies this assay can detect is 138 copies/mL. A negative result does not preclude SARS-Cov-2 infection and should not be used as the sole basis for treatment or other patient management decisions. A negative result may occur with  improper specimen collection/handling, submission of specimen other than nasopharyngeal swab, presence of viral mutation(s) within the areas targeted by this assay, and inadequate number of viral copies(<138 copies/mL). A negative result must be combined with clinical observations, patient history, and epidemiological information. The expected result is Negative.  Fact Sheet for Patients:  BloggerCourse.com  Fact Sheet for Healthcare Providers:  SeriousBroker.it  This test is no t yet approved or cleared by the [place] FDA and  has been authorized for detection and/or diagnosis of SARS-CoV-2 by FDA under an Emergency Use Authorization (EUA). This EUA will remain  in effect (meaning this test can be used) for the duration of the COVID-19 declaration under Section 564(b)(1) of the Act, 21 U.S.C.section 360bbb-3(b)(1), unless the authorization is terminated  or revoked sooner.       Influenza A by PCR NEGATIVE NEGATIVE Final   Influenza B by PCR NEGATIVE NEGATIVE Final    Comment: (NOTE) The Xpert Xpress SARS-CoV-2/FLU/RSV plus assay is intended as an aid in the diagnosis of influenza from  Nasopharyngeal swab specimens and should not be used as a sole basis for treatment. Nasal washings and aspirates are unacceptable for Xpert Xpress SARS-CoV-2/FLU/RSV testing.  Fact Sheet for Patients: BloggerCourse.com  Fact Sheet for Healthcare Providers: SeriousBroker.it  This test is not yet approved or cleared by the [place] FDA and has been authorized for detection and/or diagnosis of SARS-CoV-2 by FDA under an Emergency Use Authorization (EUA). This EUA will remain in effect (meaning this test can be used) for the duration of the COVID-19 declaration under Section 564(b)(1) of the Act, 21 U.S.C. section 360bbb-3(b)(1), unless the authorization is terminated or revoked.  Performed at [facility], [address] [place], [place] [address]          Radiology Studies: DG Chest Port 1 View  Result Date: 08/29/2021 CLINICAL DATA:  Shortness of breath EXAM: PORTABLE CHEST 1 VIEW COMPARISON:  07/29/2021 FINDINGS: Transverse diameter of heart is increased. Increased interstitial markings are seen in the parahilar regions and lower lung fields with worsening. There is blunting of both lateral CP angles. There is no pneumothorax. IMPRESSION: Cardiomegaly. Increased interstitial markings are seen in the parahilar regions and lower lung fields with interval worsening. This may suggest interstitial edema or interstitial pneumonia. Small bilateral pleural effusions. Electronically Signed   By: Ernie Avena M.D.   On: 08/29/2021 18:27        Scheduled Meds:  amLODipine  10 mg Oral Daily   aspirin  81 mg Oral q morning   atorvastatin  20 mg Oral QHS   carvedilol  25 mg Oral BID WC   enoxaparin (LOVENOX) injection  40 mg Subcutaneous Q24H   furosemide  60 mg Intravenous Q12H   guaiFENesin  600 mg Oral BID   insulin aspart  0-9 Units Subcutaneous TID WC   ipratropium-albuterol  3 mL Nebulization TID   losartan   50 mg Oral Daily   methylPREDNISolone (SOLU-MEDROL) injection  80 mg Intravenous Q12H   Followed by   Melene Muller ON 08/31/2021] predniSONE  40 mg Oral Q breakfast   pantoprazole  40 mg Oral Daily  sodium chloride flush  3 mL Intravenous Q12H   tamsulosin  0.4 mg Oral Daily   zolpidem  5 mg Oral QHS   Continuous Infusions:  cefTRIAXone (ROCEPHIN)  IV       LOS: 1 day   Time spent: 35 mins Greater than 50% of this time was spent in counseling, explanation of diagnosis, planning of further management, and coordination of care.   Voice Recognition Viviann Spare dictation system was used to create this note, attempts have been made to correct errors. Please contact the author with questions and/or clarifications.   Florencia Reasons, MD PhD FACP [facility]  Available via Epic secure chat 7am-7pm for nonurgent issues Please page for urgent issues To page the attending provider between 7A-7P or the covering provider during after hours 7P-7A, please log into the web site [email or web address] and access using universal [place] password for that web site. If you do not have the password, please call the hospital operator.    08/30/2021, 11:25 AM

## 2021-08-30 NOTE — ED Notes (Signed)
The pt is c/o having a chill.  T. 97.8

## 2021-08-30 NOTE — Progress Notes (Signed)
Patient received to room 3E30 via stretcher from ED.  Introduced to staff and to unit routine.  Bed in low position.  Call light within reach.  On telemetry.

## 2021-08-31 ENCOUNTER — Inpatient Hospital Stay (HOSPITAL_COMMUNITY): Payer: Medicare Other

## 2021-08-31 ENCOUNTER — Other Ambulatory Visit (HOSPITAL_BASED_OUTPATIENT_CLINIC_OR_DEPARTMENT_OTHER): Payer: Self-pay

## 2021-08-31 DIAGNOSIS — J441 Chronic obstructive pulmonary disease with (acute) exacerbation: Principal | ICD-10-CM

## 2021-08-31 LAB — BASIC METABOLIC PANEL
Anion gap: 7 (ref 5–15)
BUN: 21 mg/dL (ref 8–23)
CO2: 29 mmol/L (ref 22–32)
Calcium: 8.2 mg/dL — ABNORMAL LOW (ref 8.9–10.3)
Chloride: 98 mmol/L (ref 98–111)
Creatinine, Ser: 0.86 mg/dL (ref 0.61–1.24)
GFR, Estimated: >60 mL/min (ref >60)
Glucose, Bld: 220 mg/dL — ABNORMAL HIGH (ref 70–99)
Potassium: 4.1 mmol/L (ref 3.5–5.1)
Sodium: 134 mmol/L — ABNORMAL LOW (ref 135–145)

## 2021-08-31 LAB — EXPECTORATED SPUTUM ASSESSMENT W GRAM STAIN, RFLX TO RESP C

## 2021-08-31 LAB — PROCALCITONIN: Procalcitonin: <0.1 ng/mL

## 2021-08-31 LAB — GLUCOSE, CAPILLARY
Glucose-Capillary: 185 mg/dL — ABNORMAL HIGH (ref 70–99)
Glucose-Capillary: 219 mg/dL — ABNORMAL HIGH (ref 70–99)
Glucose-Capillary: 170 mg/dL — ABNORMAL HIGH (ref 70–99)

## 2021-08-31 LAB — CBC
HCT: 34.5 % — ABNORMAL LOW (ref 39.0–52.0)
Hemoglobin: 11.6 g/dL — ABNORMAL LOW (ref 13.0–17.0)
MCH: 30.9 pg (ref 26.0–34.0)
MCHC: 33.6 g/dL (ref 30.0–36.0)
MCV: 91.8 fL (ref 80.0–100.0)
Platelets: 235 10*3/uL (ref 150–400)
RBC: 3.76 MIL/uL — ABNORMAL LOW (ref 4.22–5.81)
RDW: 13.3 % (ref 11.5–15.5)
WBC: 8.3 10*3/uL (ref 4.0–10.5)
nRBC: 0 % (ref 0.0–0.2)

## 2021-08-31 MED ORDER — METOPROLOL TARTRATE 5 MG/5ML IV SOLN: 5.0000 mg | INTRAVENOUS | Status: DC | PRN

## 2021-08-31 MED ORDER — INSULIN GLARGINE-YFGN 100 UNIT/ML ~~LOC~~ SOLN
5.0000 [IU] | Freq: Every day | SUBCUTANEOUS | Status: DC
  Administered 2021-08-31 – 2021-09-01 (×2): 5 [IU] via SUBCUTANEOUS
  Filled 2021-08-31 (×3): qty 0.05

## 2021-08-31 MED ORDER — INSULIN ASPART 100 UNIT/ML IJ SOLN
0.0000 [IU] | Freq: Three times a day (TID) | INTRAMUSCULAR | Status: DC
Start: 2021-08-31 — End: 2021-09-02
  Administered 2021-08-31: 3 [IU] via SUBCUTANEOUS
  Administered 2021-08-31: 5 [IU] via SUBCUTANEOUS
  Administered 2021-09-01 (×3): 3 [IU] via SUBCUTANEOUS

## 2021-08-31 MED ORDER — BUDESONIDE 0.5 MG/2ML IN SUSP
0.5000 mg | Freq: Two times a day (BID) | RESPIRATORY_TRACT | Status: DC
  Administered 2021-08-31 – 2021-09-02 (×5): 0.5 mg via RESPIRATORY_TRACT
  Filled 2021-08-31 (×5): qty 2

## 2021-08-31 MED ORDER — TRAZODONE HCL 50 MG PO TABS: 50.0000 mg | ORAL_TABLET | Freq: Every evening | ORAL | Status: DC | PRN

## 2021-08-31 MED ORDER — PFIZER COVID-19 VAC BIVALENT 30 MCG/0.3ML IM SUSP
INTRAMUSCULAR | 0 refills | Status: DC
  Filled 2021-08-31: qty 0.3, 1d supply, fill #0

## 2021-08-31 MED ORDER — HYDRALAZINE HCL 20 MG/ML IJ SOLN: 10.0000 mg | INTRAMUSCULAR | Status: DC | PRN

## 2021-08-31 MED ORDER — INSULIN ASPART 100 UNIT/ML IJ SOLN
0.0000 [IU] | Freq: Every day | INTRAMUSCULAR | Status: DC
Start: 2021-08-31 — End: 2021-09-02
  Administered 2021-09-01: 2 [IU] via SUBCUTANEOUS

## 2021-08-31 MED ORDER — INSULIN ASPART 100 UNIT/ML IJ SOLN
2.0000 [IU] | Freq: Three times a day (TID) | INTRAMUSCULAR | Status: DC
  Administered 2021-08-31 – 2021-09-02 (×6): 2 [IU] via SUBCUTANEOUS

## 2021-08-31 NOTE — Progress Notes (Signed)
PROGRESS NOTE    Bradley BUSSA Sr.  [id number] DOB: [birth year] DOA: 08/29/2021 PCP: Colon Branch, MD   Brief Narrative:  80 y.o. 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  Patient has been receiving IV Lasix, steroids, bronchodilators aggressively. He was treated for the same in 03/2021 and 07/2021   Assessment & Plan:   Principal Problem:   COPD exacerbation ([place]) Active Problems:   Type 2 diabetes mellitus without complication, without long-term current use of insulin (HCC)   Essential hypertension   Chronic pulmonary hypertension secondary to elevated L H pressures    Chronic atrial fibrillation (HCC)   Chronic respiratory failure with hypoxia and hypercapnia (HCC)   Acute on chronic diastolic CHF (congestive heart failure) (HCC)   Acute on chronic hypoxic respiratory failure -This is combination of COPD and CHF exacerbation.  At home uses 2-3 L nasal cannula.  Still remains on 4 L nasal cannula desaturates with minimal ambulation   Acute on chronic diastolic CHF ef [id number] -Chest x-ray showing pulmonary edema and pleural effusion.  Continue Lasix 60 mg IV twice daily, Coreg 25 mg twice daily   COPD exacerbation -Suspicion for mucous plugging and atelectasis.  Continue aggressive use of I-S/flutter.  Follows outpatient pulmonary - Continue steroids, bronchodilators Pulmicort, Mucinex.  Procalcitonin negative, discontinue Rocephin     atrial fibrillation not on anticoagulation due to history of ICH,  history of PPM insertion/AV node ablation in 2012, paced rhythm -Continue Coreg   Hypertension -Continue Lasix, Coreg, losartan.  Norvasc on hold   Noninsulin-dependent type 2  diabetes, with hyperglycemia -Sliding scale and Accu-Cheks.  Semglee 5 units bedtime, Premeal insulin.  Sliding scale and Accu-Cheks   BPH - Flomax  Hyperlipidemia - Lipitor 20 mg at bedtime   DVT prophylaxis: Lovenox Code Status: Full Family Communication: Updated patient's wife over the phone as well while I was in the room  Status is: Inpatient  Remains inpatient appropriate because: Still has significant amount of abnormal breath sounds requiring IV Lasix and aggressive bronchodilators.    Subjective: Patient still has exertional dyspnea.  Tells me he feels slightly better compared to yesterday but nowhere close to his baseline  Review of Systems Otherwise negative except as per HPI, including: General: Denies fever, chills, night sweats or unintended weight loss. Resp: Denies cough, wheezing, shortness of breath. Cardiac: Denies chest pain, palpitations, orthopnea, paroxysmal nocturnal dyspnea. GI: Denies abdominal pain, nausea, vomiting, diarrhea or constipation GU: Denies dysuria, frequency, hesitancy or incontinence MS: Denies muscle aches, joint pain or swelling Neuro: Denies headache, neurologic deficits (focal weakness, numbness, tingling), abnormal gait Psych: Denies anxiety, depression, SI/HI/AVH Skin: Denies new rashes or lesions ID: Denies sick contacts, exotic exposures, travel  Examination:  General exam: Appears calm and comfortable  Respiratory system: Bilateral diminished breath sounds Cardiovascular system: S1 & S2 heard, RRR. No JVD, murmurs, rubs, gallops or clicks. No pedal edema. Gastrointestinal system: Abdomen is nondistended, soft and nontender. No organomegaly or masses felt. Normal bowel sounds heard. Central nervous system: Alert and oriented. No focal neurological deficits. Extremities: Symmetric 5 x 5 power. Skin: No rashes, lesions or ulcers Psychiatry: Judgement and insight appear normal. Mood & affect appropriate.      Objective: Vitals:   08/30/21 2032  08/30/21 2057 08/31/21 0055 08/31/21 0346  BP:  113/66 136/85 119/75  Pulse: 72 70 74 73  Resp: 20 16 17 14   Temp:  98 F (36.7 C) (!) 97.5 F (36.4 C) (!) 97.5 F (36.4 C)  TempSrc:  Oral Oral Oral  SpO2: 92% 93% 93% 94%  Weight:    83.9 kg  Height:        Intake/Output Summary (Last 24 hours) at 08/31/2021 0756 Last data filed at 08/31/2021 0556 Gross per 24 hour  Intake 880 ml  Output 1300 ml  Net -420 ml   Filed Weights   08/29/21 1706 08/30/21 1440 08/31/21 0346  Weight: 86.5 kg 83.7 kg 83.9 kg     Data Reviewed:   CBC: Recent Labs  Lab 08/29/21 1705 08/30/21 0303 08/31/21 0146  WBC 6.8 5.2 8.3  HGB 11.6* 12.4* 11.6*  HCT 35.3* 37.4* 34.5*  MCV 94.1 92.8 91.8  PLT 215 201 [id number]   Basic Metabolic Panel: Recent Labs  Lab 08/29/21 1705 08/30/21 0303 08/31/21 0146  NA 138 136 134*  K 4.0 4.3 4.1  CL 100 102 98  CO2 26 26 29   GLUCOSE 140* 254* 220*  BUN 13 17 21   CREATININE 0.86 0.91 0.86  CALCIUM 8.3* 8.7* 8.2*  MG  --  2.3  --    GFR: Estimated Creatinine Clearance: 68.5 mL/min (by C-G formula based on SCr of 0.86 mg/dL). Liver Function Tests: No results for input(s): AST, ALT, ALKPHOS, BILITOT, PROT, ALBUMIN in the last 168 hours. No results for input(s): LIPASE, AMYLASE in the last 168 hours. No results for input(s): AMMONIA in the last 168 hours. Coagulation Profile: No results for input(s): INR, PROTIME in the last 168 hours. Cardiac Enzymes: No results for input(s): CKTOTAL, CKMB, CKMBINDEX, TROPONINI in the last 168 hours. BNP (last 3 results) No results for input(s): PROBNP in the last 8760 hours. HbA1C: No results for input(s): HGBA1C in the last 72 hours. CBG: Recent Labs  Lab 08/30/21 0800 08/30/21 1324 08/30/21 1634 08/30/21 2054 08/31/21 0550  GLUCAP 175* 213* 189* 250* 185*   Lipid Profile: No results for input(s): CHOL, HDL, LDLCALC, TRIG, CHOLHDL, LDLDIRECT in the last 72  hours. Thyroid Function Tests: No results for input(s): TSH, T4TOTAL, FREET4, T3FREE, THYROIDAB in the last 72 hours. Anemia Panel: No results for input(s): VITAMINB12, FOLATE, FERRITIN, TIBC, IRON, RETICCTPCT in the last 72 hours. Sepsis Labs: Recent Labs  Lab 08/30/21 0303 08/31/21 0146  PROCALCITON <0.10 <0.10    Recent Results (from the past 240 hour(s))  Resp Panel by RT-PCR (Flu A&B, Covid) Nasopharyngeal Swab     Status: None   Collection Time: 08/29/21  5:06 PM   Specimen: Nasopharyngeal Swab; Nasopharyngeal(NP) swabs in vial transport medium  Result Value Ref Range Status   SARS Coronavirus 2 by RT PCR NEGATIVE NEGATIVE Final    Comment: (NOTE) SARS-CoV-2 target nucleic acids are NOT DETECTED.  The SARS-CoV-2 RNA is generally detectable in upper respiratory specimens during the acute phase of infection. The lowest concentration of SARS-CoV-2 viral copies this assay can detect is 138 copies/mL. A negative result does not preclude SARS-Cov-2 infection and should not be used as the sole basis for treatment or other patient management decisions. A negative result may occur with  improper specimen collection/handling, submission of specimen other than nasopharyngeal swab, presence of viral mutation(s) within the areas targeted by this assay, and inadequate number of viral copies(<138 copies/mL). A negative result must be combined with clinical observations, patient  history, and epidemiological information. The expected result is Negative.  Fact Sheet for Patients:  BloggerCourse.com  Fact Sheet for Healthcare Providers:  SeriousBroker.it  This test is no t yet approved or cleared by the [place] FDA and  has been authorized for detection and/or diagnosis of SARS-CoV-2 by FDA under an Emergency Use Authorization (EUA). This EUA will remain  in effect (meaning this test can be used) for the duration of the COVID-19  declaration under Section 564(b)(1) of the Act, 21 U.S.C.section 360bbb-3(b)(1), unless the authorization is terminated  or revoked sooner.       Influenza A by PCR NEGATIVE NEGATIVE Final   Influenza B by PCR NEGATIVE NEGATIVE Final    Comment: (NOTE) The Xpert Xpress SARS-CoV-2/FLU/RSV plus assay is intended as an aid in the diagnosis of influenza from Nasopharyngeal swab specimens and should not be used as a sole basis for treatment. Nasal washings and aspirates are unacceptable for Xpert Xpress SARS-CoV-2/FLU/RSV testing.  Fact Sheet for Patients: BloggerCourse.com  Fact Sheet for Healthcare Providers: SeriousBroker.it  This test is not yet approved or cleared by the [place] FDA and has been authorized for detection and/or diagnosis of SARS-CoV-2 by FDA under an Emergency Use Authorization (EUA). This EUA will remain in effect (meaning this test can be used) for the duration of the COVID-19 declaration under Section 564(b)(1) of the Act, 21 U.S.C. section 360bbb-3(b)(1), unless the authorization is terminated or revoked.  Performed at [facility], [address], [place], [place] [address]   Expectorated Sputum Assessment w Gram Stain, Rflx to Resp Cult     Status: None (Preliminary result)   Collection Time: 08/31/21  5:50 AM   Specimen: Sputum  Result Value Ref Range Status   Specimen Description SPUTUM  Final   Special Requests NONE  Final   Sputum evaluation   Final    THIS SPECIMEN IS ACCEPTABLE FOR SPUTUM CULTURE Performed at [facility], [address], [place], [place] [address]    Report Status PENDING  Incomplete  Culture, Respiratory w Gram Stain     Status: None (Preliminary result)   Collection Time: 08/31/21  5:50 AM   Specimen: SPU  Result Value Ref Range Status   Specimen Description SPUTUM  Final   Special Requests NONE Reflexed from [id number]  Final   Gram Stain   Final    FEW  SQUAMOUS EPITHELIAL CELLS PRESENT FEW WBC SEEN FEW GRAM POSITIVE RODS FEW GRAM POSITIVE COCCI Performed at [facility], [address] [place], [place] [address]    Culture PENDING  Incomplete   Report Status PENDING  Incomplete         Radiology Studies: DG Chest Port 1 View  Result Date: 08/29/2021 CLINICAL DATA:  Shortness of breath EXAM: PORTABLE CHEST 1 VIEW COMPARISON:  07/29/2021 FINDINGS: Transverse diameter of heart is increased. Increased interstitial markings are seen in the parahilar regions and lower lung fields with worsening. There is blunting of both lateral CP angles. There is no pneumothorax. IMPRESSION: Cardiomegaly. Increased interstitial markings are seen in the parahilar regions and lower lung fields with interval worsening. This may suggest interstitial edema or interstitial pneumonia. Small bilateral pleural effusions. Electronically Signed   By: Ernie Avena M.D.   On: 08/29/2021 18:27        Scheduled Meds:  aspirin  81 mg Oral q morning   atorvastatin  20 mg Oral QHS   carvedilol  25 mg Oral BID WC  enoxaparin (LOVENOX) injection  40 mg Subcutaneous Q24H   furosemide  60 mg Intravenous Q12H   guaiFENesin  600 mg Oral BID   insulin aspart  0-9 Units Subcutaneous TID WC   ipratropium-albuterol  3 mL Nebulization TID   losartan  50 mg Oral Daily   pantoprazole  40 mg Oral Daily   predniSONE  40 mg Oral Q breakfast   sodium chloride flush  3 mL Intravenous Q12H   tamsulosin  0.4 mg Oral Daily   zolpidem  5 mg Oral QHS   Continuous Infusions:  cefTRIAXone (ROCEPHIN)  IV Stopped (08/30/21 2053)     LOS: 2 days   Time spent= 35 mins    Lichelle Viets Arsenio Loader, MD [facility]  If 7PM-7AM, please contact night-coverage  08/31/2021, 7:56 AM

## 2021-08-31 NOTE — Consult Note (Signed)
   [facility] CM Inpatient Consult   08/31/2021  Bradley PUERTAS Sr. [birth year] [id number]  [facility] [[facility]]  Accountable Care Organization [ACO] Patient: Medicare CMS DCE  Primary Care Provider:  [name], MD [facility]  is an embedded provider with a Chronic Care Management team and program, and is listed for the transition of care follow up and appointments. Patient is active with Embedded CCM team.  Plan: Notification to be sent to the [facility] Embedded Care Management team a for post hospital needs.  Please contact for further questions,  Charlesetta Shanks, RN BSN CCM [facility]  [phone] business mobile phone Toll free office [phone]  Fax number: [phone] [email or web address]

## 2021-08-31 NOTE — Evaluation (Signed)
Physical Therapy Evaluation Patient Details Name: Bradley MODE Sr. MRN: [id number] DOB: 1940/11/05 Today's Date: 08/31/2021  History of Present Illness  Bradley BECK Sr. is a pleasant 80 y.o. male  presenting to the emergency department with increased shortness of breath and cough.  PMH: OPD with chronic hypoxic and hypercarbic respiratory failure, chronic diastolic CHF, atrial fibrillation not anticoagulated due to history of ICH, sick sinus syndrome with pacer, type 2 diabetes mellitus, and hypertension, on 3-4Lo2 via [place] at home   Clinical Impression  Pt admitted with above. Pt SpO2 89-90% on 4LO2 via [place] at baseline. Pt with noted SOB, practices purse lipped breathing regularly. Pt and spouse have excellent compensatory energy conservation strategies implemented at home. Pt with noted 3/4 DOE with ambulation but takes proper standing rest breaks. Pt near baseline and suspect will progress well. PT to follow to maintain activity tolerance and endurance. Pt to benefit from mobility team as well.        Recommendations for follow up therapy are one component of a multi-disciplinary discharge planning process, led by the attending physician.  Recommendations may be updated based on patient status, additional functional criteria and insurance authorization.  Follow Up Recommendations No PT follow up    Assistance Recommended at Discharge Intermittent Supervision/Assistance  Functional Status Assessment Patient has had a recent decline in their functional status and demonstrates the ability to make significant improvements in function in a reasonable and predictable amount of time.  Equipment Recommendations  None recommended by PT    Recommendations for Other Services       Precautions / Restrictions Precautions Precautions: Other (comment) Precaution Comments: watch SpO2 Restrictions Weight Bearing Restrictions: No      Mobility  Bed Mobility               General bed  mobility comments: pt up in chair    Transfers Overall transfer level: Modified independent                 General transfer comment: pt pushed up from arm rest, no difficulty    Ambulation/Gait Ambulation/Gait assistance: Min guard Gait Distance (Feet): 200 Feet Assistive device:  (pushed O2 tank) Gait Pattern/deviations: Step-through pattern;Wide base of support Gait velocity: wfl for condition Gait velocity interpretation: 1.31 - 2.62 ft/sec, indicative of limited community ambulator   General Gait Details: pt with mild bilat knee flexion and bowed legged, noted purse lipped breathing t/o (tyipical for pt), 2 standing rest breaks, SpO2 >87% on 4Lo2 via [place]  Stairs Stairs: Yes Stairs assistance: Min guard Stair Management: One rail Left;Alternating pattern;Forwards Number of Stairs: 5 (limited by O2 tank) General stair comments: noted SOB, stopped at end for rest break,  Wheelchair Mobility    Modified [place] (Stroke Patients Only)       Balance Overall balance assessment: Mild deficits observed, not formally tested                                           Pertinent Vitals/Pain Pain Assessment: No/denies pain    Home Living Family/patient expects to be discharged to:: Private residence Living Arrangements: Spouse/significant other Available Help at Discharge: Family;Available 24 hours/day Type of Home:  (townhome) Home Access: Stairs to enter Entrance Stairs-Rails: None Entrance Stairs-Number of Steps: 1 Alternate Level Stairs-Number of Steps: has a stair lift chair but does do the stairs, x1 flight to  kitchen, x1 additional flight to storage space Home Layout: Multi-level Home Equipment: Gilmer Mor - single point;Shower seat - built in;Grab bars - tub/shower      Prior Function Prior Level of Function : Independent/Modified Independent;Driving             Mobility Comments: no AD ADLs Comments: cook and clean     Hand Dominance    Dominant Hand: Right    Extremity/Trunk Assessment   Upper Extremity Assessment Upper Extremity Assessment: Generalized weakness    Lower Extremity Assessment Lower Extremity Assessment: Generalized weakness    Cervical / Trunk Assessment Cervical / Trunk Assessment: Normal  Communication   Communication: No difficulties  Cognition Arousal/Alertness: Awake/alert Behavior During Therapy: WFL for tasks assessed/performed Overall Cognitive Status: Within Functional Limits for tasks assessed                                          General Comments General comments (skin integrity, edema, etc.): SpO2 >87% on 4LO2 via [place] t/o session    Exercises     Assessment/Plan    PT Assessment Patient needs continued PT services  PT Problem List Decreased strength;Decreased activity tolerance;Decreased balance;Decreased mobility       PT Treatment Interventions DME instruction;Gait training;Stair training;Therapeutic activities;Functional mobility training;Therapeutic exercise;Balance training    PT Goals (Current goals can be found in the Care Plan section)  Acute Rehab PT Goals Patient Stated Goal: find out why this keeps happening PT Goal Formulation: With patient Time For Goal Achievement: 09/14/21 Potential to Achieve Goals: Good Additional Goals Additional Goal #1: Pt to score >19 on DGI to indicate minimal falls risk.    Frequency Min 2X/week   Barriers to discharge        Co-evaluation               AM-PAC PT "6 Clicks" Mobility  Outcome Measure Help needed turning from your back to your side while in a flat bed without using bedrails?: None Help needed moving from lying on your back to sitting on the side of a flat bed without using bedrails?: None Help needed moving to and from a bed to a chair (including a wheelchair)?: None Help needed standing up from a chair using your arms (e.g., wheelchair or bedside chair)?: None Help needed to  walk in hospital room?: A Little Help needed climbing 3-5 steps with a railing? : A Little 6 Click Score: 22    End of Session Equipment Utilized During Treatment: Oxygen (4LO2 via [place]) Activity Tolerance: Patient tolerated treatment well Patient left: in chair;with call bell/phone within reach Nurse Communication: Mobility status PT Visit Diagnosis: Unsteadiness on feet (R26.81);Other abnormalities of gait and mobility (R26.89);Muscle weakness (generalized) (M62.81);Difficulty in walking, not elsewhere classified (R26.2)    Time: 1121-1150 PT Time Calculation (min) (ACUTE ONLY): 29 min   Charges:   PT Evaluation $PT Eval Low Complexity: 1 Low PT Treatments $Gait Training: 8-22 mins        Lewis Shock, PT, DPT Acute Rehabilitation Services Pager #: [phone] Office #: [phone]   Iona Hansen 08/31/2021, 12:02 PM

## 2021-08-31 NOTE — Progress Notes (Signed)
Patient states that his home oxygen needs are between 3-4 Liters  Ambulated in hallway with mobility tech.  While ambulating, his O2 sats decreased to 85% on 4 L  Patient resting in chair O2 94% on 4 L.

## 2021-08-31 NOTE — Progress Notes (Signed)
Mobility Specialist Progress Note:   08/31/21 1042  Mobility  Activity Ambulated in hall  Level of Assistance Standby assist, set-up cues, supervision of patient - no hands on  Assistive Device None  Distance Ambulated (ft) 260 ft  Mobility Ambulated with assistance in hallway  Mobility Response Tolerated well  Mobility performed by Mobility specialist  Bed Position Chair  $Mobility charge 1 Mobility   Pt received in bed willing to participate in mobility. No complaints of pain. Pt returned to chair with call bell in reach and all needs met.   [facility] Public librarian Phone [phone] Secondary Phone [phone]

## 2021-09-01 LAB — CBC
HCT: 34.9 % — ABNORMAL LOW (ref 39.0–52.0)
Hemoglobin: 11.9 g/dL — ABNORMAL LOW (ref 13.0–17.0)
MCH: 31.2 pg (ref 26.0–34.0)
MCHC: 34.1 g/dL (ref 30.0–36.0)
MCV: 91.4 fL (ref 80.0–100.0)
Platelets: 228 10*3/uL (ref 150–400)
RBC: 3.82 MIL/uL — ABNORMAL LOW (ref 4.22–5.81)
RDW: 13.2 % (ref 11.5–15.5)
WBC: 8.1 10*3/uL (ref 4.0–10.5)
nRBC: 0 % (ref 0.0–0.2)

## 2021-09-01 LAB — BASIC METABOLIC PANEL
Anion gap: 5 (ref 5–15)
BUN: 23 mg/dL (ref 8–23)
CO2: 32 mmol/L (ref 22–32)
Calcium: 8.1 mg/dL — ABNORMAL LOW (ref 8.9–10.3)
Chloride: 100 mmol/L (ref 98–111)
Creatinine, Ser: 0.71 mg/dL (ref 0.61–1.24)
GFR, Estimated: >60 mL/min (ref >60)
Glucose, Bld: 197 mg/dL — ABNORMAL HIGH (ref 70–99)
Potassium: 4 mmol/L (ref 3.5–5.1)
Sodium: 137 mmol/L (ref 135–145)

## 2021-09-01 LAB — BRAIN NATRIURETIC PEPTIDE: B Natriuretic Peptide: 372.9 pg/mL — ABNORMAL HIGH (ref 0.0–100.0)

## 2021-09-01 LAB — GLUCOSE, CAPILLARY
Glucose-Capillary: 139 mg/dL — ABNORMAL HIGH (ref 70–99)
Glucose-Capillary: 166 mg/dL — ABNORMAL HIGH (ref 70–99)
Glucose-Capillary: 221 mg/dL — ABNORMAL HIGH (ref 70–99)

## 2021-09-01 LAB — MAGNESIUM: Magnesium: 2.2 mg/dL (ref 1.7–2.4)

## 2021-09-01 NOTE — Progress Notes (Signed)
PROGRESS NOTE    Bradley [name] Sr.  [id number] DOB: 25-Jun-1941 DOA: 08/29/2021 PCP: Colon Branch, MD   Brief Narrative:  80 y.o. 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  Patient has been receiving IV Lasix, steroids, bronchodilators aggressively. He was treated for the same in 03/2021 and 07/2021   Assessment & Plan:   Principal Problem:   COPD exacerbation ([place]) Active Problems:   Type 2 diabetes mellitus without complication, without long-term current use of insulin (HCC)   Essential hypertension   Chronic pulmonary hypertension secondary to elevated L H pressures    Chronic atrial fibrillation (HCC)   Chronic respiratory failure with hypoxia and hypercapnia (HCC)   Acute on chronic diastolic CHF (congestive heart failure) (HCC)   Acute on chronic hypoxic respiratory failure -This is combination of COPD and CHF exacerbation.  At home uses 2-3 L nasal cannula.  He still remains on 4-5 L nasal cannula and desaturates with minimal ambulation.   Acute on chronic diastolic CHF ef [id number] -Chest x-ray showing pulmonary edema and pleural effusion.  Continue Coreg 25 mg twice daily.  Lasix IV twice daily.  Elevated right-sided heart pressures on previous echo   COPD exacerbation -Suspicion for mucous plugging and atelectasis.  Continue aggressive use of I-S/flutter.  Follows outpatient pulmonary - Calcitonin negative.  Continue steroids, bronchodilators, Pulmicort and Mucinex     atrial fibrillation not on anticoagulation due to history of ICH,  history of PPM insertion/AV node ablation in 2012, paced rhythm -Continue Coreg   Hypertension -Continue Lasix, Coreg, losartan.  Norvasc  remains on hold   Noninsulin-dependent type 2 diabetes, with hyperglycemia -Sliding scale and Accu-Cheks.  Semglee 5 units bedtime, Premeal insulin.  Sliding scale and Accu-Cheks   BPH - Flomax  Hyperlipidemia - Lipitor 20 mg at bedtime   DVT prophylaxis: Lovenox Code Status: Full Family Communication:   Status is: Inpatient  Remains inpatient appropriate because: Still has significant amount of abnormal breath sounds requiring IV Lasix and aggressive bronchodilators.    Subjective: With ambulation his oxygen saturation dropped down to 80% while being on 4 L nasal cannula.  At rest he does feel better but with mobility he gets short of breath.  Review of Systems Otherwise negative except as per HPI, including: General: Denies fever, chills, night sweats or unintended weight loss. Resp: Denies hemoptysis Cardiac: Denies chest pain, palpitations, orthopnea, paroxysmal nocturnal dyspnea. GI: Denies abdominal pain, nausea, vomiting, diarrhea or constipation GU: Denies dysuria, frequency, hesitancy or incontinence MS: Denies muscle aches, joint pain or swelling Neuro: Denies headache, neurologic deficits (focal weakness, numbness, tingling), abnormal gait Psych: Denies anxiety, depression, SI/HI/AVH Skin: Denies new rashes or lesions ID: Denies sick contacts, exotic exposures, travel  Examination: Constitutional: Not in acute distress Respiratory: Diminished breath sounds bilaterally Cardiovascular: Normal sinus rhythm, no rubs Abdomen: Nontender nondistended good bowel sounds Musculoskeletal: No edema noted Skin: No rashes seen Neurologic: CN 2-12 grossly intact.  And nonfocal Psychiatric: Normal judgment and insight. Alert and oriented x 3. Normal mood.  Objective: Vitals:   08/31/21 1648 08/31/21 1949 08/31/21 2031 09/01/21 0327  BP: 114/79  109/68 122/76  Pulse: 72 71 70 79  Resp: 20 18 18 18   Temp: (!) 97.5 F (36.4 C)  (!)  97.5 F (36.4 C) (!) 97.5 F (36.4  C)  TempSrc: Oral  Oral Oral  SpO2: 92% 92% 97% 100%  Weight:    82.6 kg  Height:        Intake/Output Summary (Last 24 hours) at 09/01/2021 0742 Last data filed at 09/01/2021 0617 Gross per 24 hour  Intake 1437.73 ml  Output 2975 ml  Net -1537.27 ml   Filed Weights   08/30/21 1440 08/31/21 0346 09/01/21 0327  Weight: 83.7 kg 83.9 kg 82.6 kg     Data Reviewed:   CBC: Recent Labs  Lab 08/29/21 1705 08/30/21 0303 08/31/21 0146 09/01/21 0232  WBC 6.8 5.2 8.3 8.1  HGB 11.6* 12.4* 11.6* 11.9*  HCT 35.3* 37.4* 34.5* 34.9*  MCV 94.1 92.8 91.8 91.4  PLT 215 201 235 [id number]   Basic Metabolic Panel: Recent Labs  Lab 08/29/21 1705 08/30/21 0303 08/31/21 0146 09/01/21 0232  NA 138 136 134* 137  K 4.0 4.3 4.1 4.0  CL 100 102 98 100  CO2 26 26 29  32  GLUCOSE 140* 254* 220* 197*  BUN 13 17 21 23   CREATININE 0.86 0.91 0.86 0.71  CALCIUM 8.3* 8.7* 8.2* 8.1*  MG  --  2.3  --  2.2   GFR: Estimated Creatinine Clearance: 73.6 mL/min (by C-G formula based on SCr of 0.71 mg/dL). Liver Function Tests: No results for input(s): AST, ALT, ALKPHOS, BILITOT, PROT, ALBUMIN in the last 168 hours. No results for input(s): LIPASE, AMYLASE in the last 168 hours. No results for input(s): AMMONIA in the last 168 hours. Coagulation Profile: No results for input(s): INR, PROTIME in the last 168 hours. Cardiac Enzymes: No results for input(s): CKTOTAL, CKMB, CKMBINDEX, TROPONINI in the last 168 hours. BNP (last 3 results) No results for input(s): PROBNP in the last 8760 hours. HbA1C: No results for input(s): HGBA1C in the last 72 hours. CBG: Recent Labs  Lab 08/30/21 2054 08/31/21 0550 08/31/21 1628 08/31/21 2111 09/01/21 0603  GLUCAP 250* 185* 219* 170* 139*   Lipid Profile: No results for input(s): CHOL, HDL, LDLCALC, TRIG, CHOLHDL, LDLDIRECT in the last 72 hours. Thyroid Function Tests: No results for input(s): TSH, T4TOTAL, FREET4, T3FREE, THYROIDAB in the last 72  hours. Anemia Panel: No results for input(s): VITAMINB12, FOLATE, FERRITIN, TIBC, IRON, RETICCTPCT in the last 72 hours. Sepsis Labs: Recent Labs  Lab 08/30/21 0303 08/31/21 0146  PROCALCITON <0.10 <0.10    Recent Results (from the past 240 hour(s))  Resp Panel by RT-PCR (Flu A&B, Covid) Nasopharyngeal Swab     Status: None   Collection Time: 08/29/21  5:06 PM   Specimen: Nasopharyngeal Swab; Nasopharyngeal(NP) swabs in vial transport medium  Result Value Ref Range Status   SARS Coronavirus 2 by RT PCR NEGATIVE NEGATIVE Final    Comment: (NOTE) SARS-CoV-2 target nucleic acids are NOT DETECTED.  The SARS-CoV-2 RNA is generally detectable in upper respiratory specimens during the acute phase of infection. The lowest concentration of SARS-CoV-2 viral copies this assay can detect is 138 copies/mL. A negative result does not preclude SARS-Cov-2 infection and should not be used as the sole basis for treatment or other patient management decisions. A negative result may occur with  improper specimen collection/handling, submission of specimen other than nasopharyngeal swab, presence of viral mutation(s) within the areas targeted by this assay, and inadequate number of viral copies(<138 copies/mL). A negative result must be combined with clinical observations, patient history, and epidemiological information. The expected result is Negative.  Fact Sheet for  Patients:  EntrepreneurPulse.com.au  Fact Sheet for Healthcare Providers:  IncredibleEmployment.be  This test is no t yet approved or cleared by the [place] FDA and  has been authorized for detection and/or diagnosis of SARS-CoV-2 by FDA under an Emergency Use Authorization (EUA). This EUA will remain  in effect (meaning this test can be used) for the duration of the COVID-19 declaration under Section 564(b)(1) of the Act, 21 U.S.C.section 360bbb-3(b)(1), unless the authorization is  terminated  or revoked sooner.       Influenza A by PCR NEGATIVE NEGATIVE Final   Influenza B by PCR NEGATIVE NEGATIVE Final    Comment: (NOTE) The Xpert Xpress SARS-CoV-2/FLU/RSV plus assay is intended as an aid in the diagnosis of influenza from Nasopharyngeal swab specimens and should not be used as a sole basis for treatment. Nasal washings and aspirates are unacceptable for Xpert Xpress SARS-CoV-2/FLU/RSV testing.  Fact Sheet for Patients: EntrepreneurPulse.com.au  Fact Sheet for Healthcare Providers: IncredibleEmployment.be  This test is not yet approved or cleared by the [place] FDA and has been authorized for detection and/or diagnosis of SARS-CoV-2 by FDA under an Emergency Use Authorization (EUA). This EUA will remain in effect (meaning this test can be used) for the duration of the COVID-19 declaration under Section 564(b)(1) of the Act, 21 U.S.C. section 360bbb-3(b)(1), unless the authorization is terminated or revoked.  Performed at [facility], [place] [address] [place], [place] [address]   Expectorated Sputum Assessment w Gram Stain, Rflx to Resp Cult     Status: None   Collection Time: 08/31/21  5:50 AM   Specimen: Sputum  Result Value Ref Range Status   Specimen Description SPUTUM  Final   Special Requests NONE  Final   Sputum evaluation   Final    THIS SPECIMEN IS ACCEPTABLE FOR SPUTUM CULTURE Performed at [facility], [place] [address] [place], [place] [address]    Report Status 08/31/2021 FINAL  Final  Culture, Respiratory w Gram Stain     Status: None (Preliminary result)   Collection Time: 08/31/21  5:50 AM   Specimen: SPU  Result Value Ref Range Status   Specimen Description SPUTUM  Final   Special Requests NONE Reflexed from [id number]  Final   Gram Stain   Final    FEW SQUAMOUS EPITHELIAL CELLS PRESENT FEW WBC SEEN FEW GRAM POSITIVE RODS FEW GRAM POSITIVE COCCI Performed at [place], [place] [address] [place], [place] [address]    Culture PENDING  Incomplete   Report Status PENDING  Incomplete         Radiology Studies: DG Chest Port 1 View  Result Date: 08/31/2021 CLINICAL DATA:  Shortness of breath EXAM: PORTABLE CHEST 1 VIEW COMPARISON:  Previous studies including the examination of 08/29/2021 FINDINGS: Transverse diameter of heart is increased. Pacemaker battery is seen in the left infraclavicular region. Central pulmonary vessels are more prominent. Increased interstitial markings are seen in the parahilar regions and lower lung fields. There is interval worsening of infiltrate in the left lower lung fields. There is blunting of both lateral CP angles. There is no pneumothorax. IMPRESSION: There are patchy infiltrates in the lower lung fields, more so on the left side with interval worsening suggesting worsening of atelectasis/pneumonia. Small bilateral pleural effusions. Electronically Signed   By: Elmer Picker M.D.   On: 08/31/2021 08:40        Scheduled Meds:  aspirin  81 mg Oral q morning   atorvastatin  20 mg Oral QHS   budesonide (PULMICORT) nebulizer solution  0.5 mg Nebulization BID   carvedilol  25 mg Oral BID WC   enoxaparin (LOVENOX) injection  40 mg Subcutaneous Q24H   furosemide  60 mg Intravenous Q12H   guaiFENesin  600 mg Oral BID   insulin aspart  0-15 Units Subcutaneous TID WC   insulin aspart  0-5 Units Subcutaneous QHS   insulin aspart  2 Units Subcutaneous TID WC   insulin glargine-yfgn  5 Units Subcutaneous QHS   ipratropium-albuterol  3 mL Nebulization TID   losartan  50 mg Oral Daily   pantoprazole  40 mg Oral Daily   predniSONE  40 mg Oral Q breakfast   sodium chloride flush  3 mL Intravenous Q12H   tamsulosin  0.4 mg Oral Daily   zolpidem  5 mg Oral QHS   Continuous Infusions:  cefTRIAXone (ROCEPHIN)  IV 1 g (08/31/21 2047)     LOS: 3 days   Time spent= 35 mins    Burnette Valenti Joline Maxcy, MD [facility]  If  7PM-7AM, please contact night-coverage  09/01/2021, 7:42 AM

## 2021-09-01 NOTE — Progress Notes (Signed)
Mobility Specialist Progress Note:   09/01/21 1144  Mobility  Activity Ambulated in hall  Level of Assistance Modified independent, requires aide device or extra time  Assistive Device None  Distance Ambulated (ft) 540 ft  Mobility Ambulated with assistance in hallway  Mobility Response Tolerated well  Mobility performed by Mobility specialist  Bed Position Chair  $Mobility charge 1 Mobility   During Mobility: 90% SpO2  Pt received in bed willing to participate in mobility. No complaints of pain. Pt returned to chair with call bell in reach and all needs met.   [facility] Public librarian Phone [phone] Secondary Phone [phone]

## 2021-09-01 NOTE — Progress Notes (Signed)
Heart Failure Nurse Navigator Progress Note  PCP: [name], MD PCP-Cardiologist: Lavell Islam., MD Admission Diagnosis: COPD/CHF exac Admitted from: home with family  Presentation:   Bradley Searles Sr. presented 11/19 with increased SOB. Pt sitting in recliner on 4 LPM O2 per [place] (home 3-4LPM) with legs on ground. Pt very interactive with interview process. Lives at home with spouse, granddaughter, her boyfriend and baby. Pt states he is independent of ADLs and is the primary driver and cook at home. Pt helps spouse with transportation to appts. No immediate social need noted. Explained benefits of HV TOC clinic, pt agreeable.   ECHO/ LVEF: 55-60%, LA mod dilate, RA mod dilated.  Clinical Course:  Past Medical History:  Diagnosis Date   Abnormal CT scan, chest 2012   CT chest, several lymphadenopathies. Sees pulmonary   Anemia    intermittent   Arthritis    "?back" (09/19/2018)   Atrial fibrillation (HCC)    s/p AV node ablation & BiV PPM implantation 09/08/11 (op dictation pending)   BPH (benign prostatic hyperplasia)    Saw Dr [name] 2004, normal renal u/s   Bronchitis 08/26/2017   CAD (coronary artery disease)    CHF (congestive heart failure) (HCC)    Thought primarily to be non-systolic although EF down (EF 46-27% 12/2010, down to 35-40% 09/05/11), cath 2008 with no CAD, nuclear study 07/2011 showing Small area of reversibility in the distal ant/lat wall the left ventricle suspicious for ischemia/septal wall HK but felt to be low risk  (per D/C Summary 07/2011)   Chronic bronchitis (HCC)    "get it ~ q yr"   Chronic respiratory failure with hypoxia and hypercapnia (HCC) 09/14/2010   Followed in Pulmonary clinic/ [facility]/ [place]  Started on 02 2lpm at discharge 09/23/11   - PFT's 10/28/2011  FEV1  1.40 (51%)  with ratio 70 and no better p B2 and DLCO 53 corrects to 101    - PFT's 10/18/2014    FEV1 1.71 (59%) with ratio 68 and no sign change p B2 and DLCO 53 corrects to  85  -  HC03   07/28/20  = 33  -  HCO3   03/17/21     = 31     Heart murmur    HLD (hyperlipidemia)    HTN (hypertension)    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   Pleural effusion 2008   S/p decortication   Pneumonia 08/02/2011   Pulmonary HTN (HCC)    per cath 2008   Type II diabetes mellitus (HCC) 1999     Social History   Socioeconomic History   Marital status: Married    Spouse name: Bradley [name]   Number of children: 1   Years of education: Not on file   Highest education level: Not on file  Occupational History   Occupation: retired Publishing rights manager: RETIRED  Tobacco Use   Smoking status: Former    Packs/day: 2.50    Years: 25.00    Pack years: 62.50    Types: Cigarettes, Cigars    Quit date: 10/11/1980    Years since quitting: 40.9   Smokeless tobacco: Never  Vaping Use   Vaping Use: Never used  Substance  and Sexual Activity   Alcohol use: Not Currently    Comment: 1 can of beer ever 4-5 months   Drug use: No   Sexual activity: Not Currently  Other Topics Concern   Not on file  Social History Narrative   Moved from [place]   He lives w/ his wife, Bradley [name]   1 son, substance abuse, passed away 19-Oct-2016         Social Determinants of Health   Financial Resource Strain: Low Risk    Difficulty of Paying Living Expenses: Not hard at all  Food Insecurity: No Food Insecurity   Worried About Programme researcher, broadcasting/film/video in the Last Year: Never true   Barista in the Last Year: Never true  Transportation Needs: No Transportation Needs   Lack of Transportation (Medical): No   Lack of Transportation (Non-Medical): No  Physical Activity: Insufficiently Active   Days of Exercise per Week: 5 days   Minutes of Exercise per Session: 20 min  Stress: No Stress Concern  Present   Feeling of Stress : Not at all  Social Connections: Moderately Integrated   Frequency of Communication with Friends and Family: More than three times a week   Frequency of Social Gatherings with Friends and Family: More than three times a week   Attends Religious Services: More than 4 times per year   Active Member of [employer] or Organizations: No   Attends Banker Meetings: Never   Marital Status: Married    High Risk Criteria for Readmission and/or Poor Patient Outcomes: Heart failure hospital admissions (last 6 months): 2 No Show rate: 1% Difficult social situation: no Demonstrates medication adherence: yes Primary Language: English Literacy level: able to read/write and comprehend. Wears glasses.  Education Assessment and Provision:  Detailed education and instructions provided on heart failure disease management including the following:  Signs and symptoms of Heart Failure When to call the physician Importance of daily weights Low sodium diet Fluid restriction Medication management Anticipated future follow-up appointments  Patient education given on each of the above topics.  Patient acknowledges understanding via teach back method and acceptance of all instructions.  Education Materials:  "Living Better With Heart Failure" Booklet, HF zone tool, & Daily Weight Tracker Tool.  Patient has scale at home: yes Patient has pill box at home: yes   Barriers of Care:   -none  Considerations/Referrals:   Referral made to Heart Failure Pharmacist Stewardship: no Referral made to Heart Failure CSW/NCM TOC: no Referral made to Heart & Vascular TOC clinic: yes, 11/28  Items for Follow-up on DC/TOC: -optimize   Ozella Rocks, MSN, RN Heart Failure Nurse Navigator [phone]

## 2021-09-01 NOTE — Evaluation (Signed)
Occupational Therapy Evaluation/Discharge Patient Details Name: [name] MRN: [id number] DOB: 1940/12/28 Today's Date: 09/01/2021   History of Present Illness Bradley GULLION Sr. is a pleasant 80 y.o. male  presenting to the emergency department with increased shortness of breath and cough.  PMH: OPD with chronic hypoxic and hypercarbic respiratory failure, chronic diastolic CHF, atrial fibrillation not anticoagulated due to history of ICH, sick sinus syndrome with pacer, type 2 diabetes mellitus, and hypertension, on 3-4Lo2 via [place] at home   Clinical Impression   PTA, pt lives with family and reports complete Independence in all daily tasks and active at baseline. Pt presents now with improving cardiopulmonary tolerance during ADLs/mobility with SpO2 93% and above on baseline 4 L O2. No physical assistance needed to complete tasks and pt without safety concerns. Collaborated on energy conservation and strategies to prevent CHF exacerbation with pt already implementing many of these techniques. Encouraged frequent hallway mobility with staff with no further skilled OT services needed at acute level. OT to sign off.       Recommendations for follow up therapy are one component of a multi-disciplinary discharge planning process, led by the attending physician.  Recommendations may be updated based on patient status, additional functional criteria and insurance authorization.   Follow Up Recommendations  No OT follow up    Assistance Recommended at Discharge None  Functional Status Assessment  Patient has not had a recent decline in their functional status  Equipment Recommendations  None recommended by OT    Recommendations for Other Services       Precautions / Restrictions Precautions Precautions: Other (comment) Precaution Comments: watch SpO2 Restrictions Weight Bearing Restrictions: No      Mobility Bed Mobility Overal bed mobility: Independent                   Transfers Overall transfer level: Independent Equipment used: None                      Balance Overall balance assessment: No apparent balance deficits (not formally assessed)                                         ADL either performed or assessed with clinical judgement   ADL Overall ADL's : Modified independent;Independent                                       General ADL Comments: Has been mobilizing to bathroom without assist, mobilized in hallway with independent navigation of O2 tank. Discussed energy conservation strategies with pt already implementing many of them, no safety concerns. Collaborated with CHF mgmt strategies with pt actively weighing self daily at same time, monitoring fluid and sodium intake     Vision Baseline Vision/History: 1 Wears glasses Ability to See in Adequate Light: 0 Adequate Patient Visual Report: No change from baseline Vision Assessment?: No apparent visual deficits     Perception     Praxis      Pertinent Vitals/Pain Pain Assessment: No/denies pain     Hand Dominance Right   Extremity/Trunk Assessment Upper Extremity Assessment Upper Extremity Assessment: Overall WFL for tasks assessed   Lower Extremity Assessment Lower Extremity Assessment: Defer to PT evaluation   Cervical / Trunk Assessment Cervical / Trunk Assessment:  Normal   Communication Communication Communication: No difficulties   Cognition Arousal/Alertness: Awake/alert Behavior During Therapy: WFL for tasks assessed/performed Overall Cognitive Status: Within Functional Limits for tasks assessed                                       General Comments  Spo2 93% on 4 L O2 pre and post activity    Exercises     Shoulder Instructions      Home Living Family/patient expects to be discharged to:: Private residence Living Arrangements: Spouse/significant other;Other (Comment) (grandchildren stay in  detached apartment) Available Help at Discharge: Family;Available 24 hours/day Type of Home: Other(Comment) (townhouse) Home Access: Stairs to enter [employer] of Steps: 1 Entrance Stairs-Rails: None Home Layout: Multi-level Alternate Level Stairs-Number of Steps: has a stair lift chair but does do the stairs, x1 flight to kitchen, x1 additional flight to storage space Alternate Level Stairs-Rails: Right Bathroom Shower/Tub: Producer, television/film/video: Standard Bathroom Accessibility: Yes   Home Equipment: Cane - single point;Shower seat - built in;Grab bars - tub/shower;Other (comment) (stair lift)   Additional Comments: 3-4 L home O2      Prior Functioning/Environment Prior Level of Function : Independent/Modified Independent;Driving             Mobility Comments: no AD ADLs Comments: grocery shops (uses shopping cart usually, scooter if needed), driving, does all IADLs        OT Problem List:        OT Treatment/Interventions:      OT Goals(Current goals can be found in the care plan section) Acute Rehab OT Goals Patient Stated Goal: solve respiratory issues, prevent fluid buildup around lungs OT Goal Formulation: All assessment and education complete, DC therapy  OT Frequency:     Barriers to D/C:            Co-evaluation              AM-PAC OT "6 Clicks" Daily Activity     Outcome Measure Help from another person eating meals?: None Help from another person taking care of personal grooming?: None Help from another person toileting, which includes using toliet, bedpan, or urinal?: None Help from another person bathing (including washing, rinsing, drying)?: None Help from another person to put on and taking off regular upper body clothing?: None Help from another person to put on and taking off regular lower body clothing?: None 6 Click Score: 24   End of Session Equipment Utilized During Treatment: Oxygen  Activity Tolerance:  Patient tolerated treatment well Patient left: in bed;with call bell/phone within reach  OT Visit Diagnosis: Other (comment) (decreased cardiopulmonary tolerance)                Time: 9937-1696 OT Time Calculation (min): 34 min Charges:  OT General Charges $OT Visit: 1 Visit OT Evaluation $OT Eval Low Complexity: 1 Low OT Treatments $Self Care/Home Management : 8-22 mins  Bradd Canary, OTR/L Acute Rehab Services Office: [phone]   Lorre Munroe 09/01/2021, 7:50 AM

## 2021-09-02 ENCOUNTER — Other Ambulatory Visit (HOSPITAL_COMMUNITY): Payer: Self-pay

## 2021-09-02 LAB — BASIC METABOLIC PANEL
Anion gap: 6 (ref 5–15)
BUN: 20 mg/dL (ref 8–23)
CO2: 35 mmol/L — ABNORMAL HIGH (ref 22–32)
Calcium: 8.1 mg/dL — ABNORMAL LOW (ref 8.9–10.3)
Chloride: 96 mmol/L — ABNORMAL LOW (ref 98–111)
Creatinine, Ser: 0.74 mg/dL (ref 0.61–1.24)
GFR, Estimated: >60 mL/min (ref >60)
Glucose, Bld: 180 mg/dL — ABNORMAL HIGH (ref 70–99)
Potassium: 4.4 mmol/L (ref 3.5–5.1)
Sodium: 137 mmol/L (ref 135–145)

## 2021-09-02 LAB — CULTURE, RESPIRATORY W GRAM STAIN: Culture: NORMAL

## 2021-09-02 LAB — MAGNESIUM: Magnesium: 2.3 mg/dL (ref 1.7–2.4)

## 2021-09-02 LAB — GLUCOSE, CAPILLARY
Glucose-Capillary: 187 mg/dL — ABNORMAL HIGH (ref 70–99)
Glucose-Capillary: 173 mg/dL — ABNORMAL HIGH (ref 70–99)
Glucose-Capillary: 95 mg/dL (ref 70–99)

## 2021-09-02 MED ORDER — DOXYCYCLINE HYCLATE 100 MG PO TABS
100.0000 mg | ORAL_TABLET | Freq: Two times a day (BID) | ORAL | 0 refills | Status: AC
  Filled 2021-09-02: qty 4, 2d supply, fill #0

## 2021-09-02 MED ORDER — PREDNISONE 20 MG PO TABS
40.0000 mg | ORAL_TABLET | Freq: Every day | ORAL | 0 refills | Status: AC
Start: 2021-09-03 — End: 2021-09-05
  Filled 2021-09-02: qty 4, 2d supply, fill #0

## 2021-09-02 NOTE — Care Management Important Message (Signed)
Important Message  Patient Details  Name: Bradley DINES Sr. MRN: [id number] Date of Birth: 18-Oct-1940   Medicare Important Message Given:  Yes     Renie Ora 09/02/2021, 11:10 AM

## 2021-09-02 NOTE — TOC Transition Note (Signed)
Transition of Care [facility]) - CM/SW Discharge Note   Patient Details  Name: ALEXANDRIA CURRENT Sr. MRN: [id number] Date of Birth: Aug 13, 1941  Transition of Care [facility]) CM/SW Contact:  Leone Haven, RN Phone Number: 09/02/2021, 10:34 AM   Clinical Narrative:    Patient is for dc today, has no needs.    Final next level of care: Home/Self Care Barriers to Discharge: No Barriers Identified   Patient Goals and CMS Choice Patient states their goals for this hospitalization and ongoing recovery are:: return home   Choice offered to / list presented to : NA  Discharge Placement                       Discharge Plan and Services   Discharge Planning Services: CM Consult              DME Agency: NA                  Social Determinants of Health (SDOH) Interventions     Readmission Risk Interventions Readmission Risk Prevention Plan 09/02/2021 08/03/2021  Transportation Screening Complete Complete  PCP or Specialist Appt within 5-7 Days - Complete  PCP or Specialist Appt within 3-5 Days Complete -  Home Care Screening - Complete  Medication Review (RN CM) - Complete  HRI or Home Care Consult Complete -  Social Work Consult for Recovery Care Planning/Counseling Complete -  Palliative Care Screening Not Applicable -  Medication Review Oceanographer) Complete -  Some recent data might be hidden

## 2021-09-02 NOTE — Progress Notes (Signed)
Remote pacemaker transmission.   

## 2021-09-02 NOTE — Discharge Summary (Signed)
Physician Discharge Summary  Bradley MCGOWEN Sr. [id number] DOB: July 04, 1941 DOA: 08/29/2021  PCP: Colon Branch, MD  Admit date: 08/29/2021 Discharge date: 09/02/2021 30 Day Unplanned Readmission Risk Score    Flowsheet Row ED to Hosp-Admission (Current) from 08/29/2021 in [facility]  30 Day Unplanned Readmission Risk Score (%) 25 Filed at 09/02/2021 0801       This score is the patient's risk of an unplanned readmission within 30 days of being discharged (0 -100%). The score is based on dignosis, age, lab data, medications, orders, and past utilization.   Low:  0-14.9   Medium: 15-21.9   High: 22-29.9   Extreme: 30 and above          Admitted From: Home Disposition: Home  Recommendations for Outpatient Follow-up:  Follow up with PCP in 1-2 weeks Please obtain BMP/CBC in one week Please follow up with your PCP on the following pending results: Unresulted Labs (From admission, onward)     Start     Ordered   09/05/21 0500  Creatinine, serum  (enoxaparin (LOVENOX)    CrCl >/= 30 ml/min)  Weekly,   R     Comments: while on enoxaparin therapy    08/29/21 2141   09/02/21 [id number]  Basic metabolic panel  Daily,   R     Question:  Specimen collection method  Answer:  Lab=Lab collect   09/01/21 0744   09/01/21 0500  Magnesium  Daily,   R     Question:  Specimen collection method  Answer:  Lab=Lab collect   08/31/21 0802              Home Health: None Equipment/Devices: None  Discharge Condition: Stable CODE STATUS: Full code Diet recommendation: Low-sodium  Subjective: Seen and examined.  Patient feels much better.  He feels that his breathing is back to baseline.  He feels that he is ready to go home today.  Brief/Interim Summary: 80 y.o. 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  Patient was started on IV Solu-Medrol, IV Lasix and Rocephin.  Has had good diuresis with almost 5 L net negative so far.  Breathing has improved.  No more wheezes on exam and no crackles on exam either.  Patient currently on 4 L but saturation 100%.  He uses 2 to 3 L oxygen at home.  He is back to baseline, I am sure we can bring him down to 2 L easily.  Patient expresses his desire to go home and celebrate Thanksgiving with family.  Very reasonable, he is being discharged in stable condition.  I will prescribe him 2 more days of oral prednisone and doxycycline.  He will resume his home dose of Lasix.  We will follow-up with cardiology and PCP.  Resume rest of the home medications.  Discharge Diagnoses:  Principal Problem:   COPD exacerbation ([place]) Active Problems:   Type 2 diabetes mellitus without complication, without long-term current use of insulin (HCC)   Essential hypertension   Chronic pulmonary hypertension secondary to elevated L H pressures    Chronic atrial fibrillation (HCC)   Chronic respiratory failure with hypoxia and hypercapnia (HCC)   Acute on chronic diastolic CHF (congestive  heart failure) [facility])    Discharge Instructions   Allergies as of 09/02/2021       Reactions   Hydrocodone Other (See Comments)   "given to him in the hospital; went thru withdrawals once home; dr said not to take it again" (09/27/2012)   Tramadol Other (See Comments)   "given to him in the hospital; went thru withdrawals once home; dr said not to take it again" (09/27/2012)   Tadalafil Other (See Comments)   headache, backache   Avelox [moxifloxacin Hydrochloride] Itching, Other (See Comments)   Headache        Medication List     STOP taking these medications    guaiFENesin 600 MG 12 hr tablet Commonly known as: MUCINEX        TAKE these medications    acetaminophen 500 MG tablet Commonly known as: TYLENOL Take 500 mg by mouth every 6 (six) hours as needed for headache or moderate pain.   albuterol 108 (90 Base) MCG/ACT inhaler Commonly known as: VENTOLIN HFA Inhale 2 puffs into the lungs every 6 (six) hours as needed for wheezing or shortness of breath.   amLODipine 10 MG tablet Commonly known as: NORVASC Take 0.5 tablets (5 mg total) by mouth daily. What changed: how much to take   aspirin 81 MG tablet Take 81 mg by mouth every morning.   atorvastatin 20 MG tablet Commonly known as: LIPITOR TAKE 1 TABLET AT BEDTIME   budesonide 0.5 MG/2ML nebulizer solution Commonly known as: PULMICORT Take 2 mLs (0.5 mg total) by nebulization 2 (two) times daily.   calcium-vitamin D 500-200 MG-UNIT tablet Commonly known as: OSCAL WITH D Take 1 tablet by mouth every morning.   carvedilol 25 MG tablet Commonly known as: COREG TAKE 1 TABLET TWICE A DAY WITH MEALS   cholecalciferol 25 MCG (1000 UNIT) tablet Commonly known as: VITAMIN D3 Take 1,000 Units by mouth daily.   diphenhydramine-acetaminophen 25-500 MG Tabs tablet Commonly known as: TYLENOL PM Take 1 tablet by mouth at bedtime as needed.   doxycycline 100 MG tablet Commonly known as: ADOXA Take 1 tablet (100 mg total) by mouth 2 (two) times daily for 2 days.   furosemide 40 MG tablet Commonly known as: LASIX TAKE 2 TABLETS DAILY EXCEPT ON MONDAY, WEDNESDAY, AND FRIDAY TAKE 2 TABLETS IN THE MORNING AND 1 TABLET IN THE AFTERNOON What changed:  how much to take how to take this when to take this additional instructions   ipratropium-albuterol 0.5-2.5 (3) MG/3ML Soln Commonly known as: DUONEB Take 3 mLs by nebulization 2 (two) times daily as needed. What changed: reasons to take this   Januvia 100 MG tablet Generic drug: sitaGLIPtin TAKE 1 TABLET DAILY What changed: how much to take   loratadine 10 MG tablet Commonly known as:  CLARITIN Take 1 tablet (10 mg total) by mouth daily.   losartan 50 MG tablet Commonly known as: COZAAR Take 0.5 tablets (25 mg total) by mouth daily. What changed: how much to take   multivitamins ther. w/minerals Tabs tablet Take 1 tablet by mouth daily.   nitroGLYCERIN 0.4 MG SL tablet Commonly known as: NITROSTAT Place 1 tablet (0.4 mg total) under the tongue every 5 (five) minutes x 3 doses as needed for chest pain.   onetouch ultrasoft lancets Check blood sugars no more than twice daily   OneTouch Verio test strip Generic drug: glucose blood USE TO CHECK BLOOD SUGAR NO MORE THAN TWICE A DAY   pantoprazole 40 MG tablet  Commonly known as: PROTONIX Take 40 mg by mouth daily.   predniSONE 20 MG tablet Commonly known as: DELTASONE Take 2 tablets (40 mg total) by mouth daily with breakfast for 2 days. Start taking on: September 03, 2021   tamsulosin 0.4 MG Caps capsule Commonly known as: FLOMAX TAKE 1 CAPSULE DAILY AFTER SUPPER What changed: See the new instructions.   zolpidem 10 MG tablet Commonly known as: AMBIEN Take 10 mg by mouth at bedtime.        Follow-up [place], MD Follow up in 1 week(s).   Specialty: Internal Medicine Contact information: [address] [place] [address] [phone]         Skeet Latch, MD .   Specialty: Cardiology Contact information: [address] [place] [address] [phone]                Allergies  Allergen Reactions   Hydrocodone Other (See Comments)    "given to him in the hospital; went thru withdrawals once home; dr said not to take it again" (09/27/2012)   Tramadol Other (See Comments)    "given to him in the hospital; went thru withdrawals once home; dr said not to take it again" (09/27/2012)   Tadalafil Other (See Comments)    headache, backache   Avelox [Moxifloxacin Hydrochloride] Itching and Other (See Comments)    Headache     Consultations: None   Procedures/Studies: DG Chest Port 1 View  Result Date: 08/31/2021 CLINICAL DATA:  Shortness of breath EXAM: PORTABLE CHEST 1 VIEW COMPARISON:  Previous studies including the examination of 08/29/2021 FINDINGS: Transverse diameter of heart is increased. Pacemaker battery is seen in the left infraclavicular region. Central pulmonary vessels are more prominent. Increased interstitial markings are seen in the parahilar regions and lower lung fields. There is interval worsening of infiltrate in the left lower lung fields. There is blunting of both lateral CP angles. There is no pneumothorax. IMPRESSION: There are patchy infiltrates in the lower lung fields, more so on the left side with interval worsening suggesting worsening of atelectasis/pneumonia. Small bilateral pleural effusions. Electronically Signed   By: Elmer Picker M.D.   On: 08/31/2021 08:40   DG Chest Port 1 View  Result Date: 08/29/2021 CLINICAL DATA:  Shortness of breath EXAM: PORTABLE CHEST 1 VIEW COMPARISON:  07/29/2021 FINDINGS: Transverse diameter of heart is increased. Increased interstitial markings are seen in the parahilar regions and lower lung fields with worsening. There is blunting of both lateral CP angles. There is no pneumothorax. IMPRESSION: Cardiomegaly. Increased interstitial markings are seen in the parahilar regions and lower lung fields with interval worsening. This may suggest interstitial edema or interstitial pneumonia. Small bilateral pleural effusions. Electronically Signed   By: Elmer Picker M.D.   On: 08/29/2021 18:27   CUP PACEART REMOTE DEVICE CHECK  Result Date: 08/25/2021 Scheduled remote reviewed. Normal device function.  16 NSVT, longest 12 sec (VT-Monitor episode #420) with rate 160-170's on 05/26/21. 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 Next remote 91 days.    Discharge Exam: Vitals:   09/02/21  0436 09/02/21 0901  BP: 132/87   Pulse: 74 73  Resp: 20 16  Temp: 97.6 F (36.4 C)   SpO2: 100% 100%   Vitals:   09/01/21 1954 09/01/21 2131 09/02/21 0436 09/02/21 0901  BP: 105/67 121/80 132/87   Pulse: 69  74  73  Resp: 20  20 16   Temp: (!) 97.4 F (36.3 C)  97.6 F (36.4 C)   TempSrc: Oral  Oral   SpO2: 97%  100% 100%  Weight:   83.1 kg   Height:        General: Pt is alert, awake, not in acute distress Cardiovascular: RRR, S1/S2 +, no rubs, no gallops Respiratory: CTA bilaterally, no wheezing, no rhonchi Abdominal: Soft, NT, ND, bowel sounds + Extremities: no edema, no cyanosis    The results of significant diagnostics from this hospitalization (including imaging, microbiology, ancillary and laboratory) are listed below for reference.     Microbiology: Recent Results (from the past 240 hour(s))  Resp Panel by RT-PCR (Flu A&B, Covid) Nasopharyngeal Swab     Status: None   Collection Time: 08/29/21  5:06 PM   Specimen: Nasopharyngeal Swab; Nasopharyngeal(NP) swabs in vial transport medium  Result Value Ref Range Status   SARS Coronavirus 2 by RT PCR NEGATIVE NEGATIVE Final    Comment: (NOTE) SARS-CoV-2 target nucleic acids are NOT DETECTED.  The SARS-CoV-2 RNA is generally detectable in upper respiratory specimens during the acute phase of infection. The lowest concentration of SARS-CoV-2 viral copies this assay can detect is 138 copies/mL. A negative result does not preclude SARS-Cov-2 infection and should not be used as the sole basis for treatment or other patient management decisions. A negative result may occur with  improper specimen collection/handling, submission of specimen other than nasopharyngeal swab, presence of viral mutation(s) within the areas targeted by this assay, and inadequate number of viral copies(<138 copies/mL). A negative result must be combined with clinical observations, patient history, and epidemiological information. The expected  result is Negative.  Fact Sheet for Patients:  EntrepreneurPulse.com.au  Fact Sheet for Healthcare Providers:  IncredibleEmployment.be  This test is no t yet approved or cleared by the [place] FDA and  has been authorized for detection and/or diagnosis of SARS-CoV-2 by FDA under an Emergency Use Authorization (EUA). This EUA will remain  in effect (meaning this test can be used) for the duration of the COVID-19 declaration under Section 564(b)(1) of the Act, 21 U.S.C.section 360bbb-3(b)(1), unless the authorization is terminated  or revoked sooner.       Influenza A by PCR NEGATIVE NEGATIVE Final   Influenza B by PCR NEGATIVE NEGATIVE Final    Comment: (NOTE) The Xpert Xpress SARS-CoV-2/FLU/RSV plus assay is intended as an aid in the diagnosis of influenza from Nasopharyngeal swab specimens and should not be used as a sole basis for treatment. Nasal washings and aspirates are unacceptable for Xpert Xpress SARS-CoV-2/FLU/RSV testing.  Fact Sheet for Patients: EntrepreneurPulse.com.au  Fact Sheet for Healthcare Providers: IncredibleEmployment.be  This test is not yet approved or cleared by the [place] FDA and has been authorized for detection and/or diagnosis of SARS-CoV-2 by FDA under an Emergency Use Authorization (EUA). This EUA will remain in effect (meaning this test can be used) for the duration of the COVID-19 declaration under Section 564(b)(1) of the Act, 21 U.S.C. section 360bbb-3(b)(1), unless the authorization is terminated or revoked.  Performed at [facility], [place] [address], [place],  [address]   Expectorated Sputum Assessment w Gram Stain, Rflx to Resp Cult     Status: None   Collection Time: 08/31/21  5:50 AM   Specimen: Sputum  Result Value Ref Range Status   Specimen Description SPUTUM  Final   Special Requests NONE  Final   Sputum evaluation   Final  THIS SPECIMEN IS ACCEPTABLE FOR SPUTUM CULTURE Performed at [facility], [place] [address] [place], [place] [address]    Report Status 08/31/2021 FINAL  Final  Culture, Respiratory w Gram Stain     Status: None   Collection Time: 08/31/21  5:50 AM   Specimen: SPU  Result Value Ref Range Status   Specimen Description SPUTUM  Final   Special Requests NONE Reflexed from [id number]  Final   Gram Stain   Final    FEW SQUAMOUS EPITHELIAL CELLS PRESENT FEW WBC SEEN FEW GRAM POSITIVE RODS FEW GRAM POSITIVE COCCI    Culture   Final    FEW Normal respiratory flora-no Staph aureus or Pseudomonas seen Performed at [facility], [place] [address], [place], [place] [address]    Report Status 09/02/2021 FINAL  Final     Labs: BNP (last 3 results) Recent Labs    07/29/21 1800 08/29/21 1705 09/01/21 0232  BNP 170.3* 544.7* [id number]*   Basic Metabolic Panel: Recent Labs  Lab 08/29/21 1705 08/30/21 0303 08/31/21 0146 09/01/21 0232 09/02/21 0048  NA 138 136 134* 137 137  K 4.0 4.3 4.1 4.0 4.4  CL 100 102 98 100 96*  CO2 26 26 29  32 35*  GLUCOSE 140* 254* 220* 197* 180*  BUN 13 17 21 23 20   CREATININE 0.86 0.91 0.86 0.71 0.74  CALCIUM 8.3* 8.7* 8.2* 8.1* 8.1*  MG  --  2.3  --  2.2 2.3   Liver Function Tests: No results for input(s): AST, ALT, ALKPHOS, BILITOT, PROT, ALBUMIN in the last 168 hours. No results for input(s): LIPASE, AMYLASE in the last 168 hours. No results for input(s): AMMONIA in the last 168 hours. CBC: Recent Labs  Lab 08/29/21 1705 08/30/21 0303 08/31/21 0146 09/01/21 0232  WBC 6.8 5.2 8.3 8.1  HGB 11.6* 12.4* 11.6* 11.9*  HCT 35.3* 37.4* 34.5* 34.9*  MCV 94.1 92.8 91.8 91.4  PLT 215 201 235 228   Cardiac Enzymes: No results for input(s): CKTOTAL, CKMB, CKMBINDEX, TROPONINI in the last 168 hours. BNP: Invalid input(s): POCBNP CBG: Recent Labs  Lab 08/31/21 2111 09/01/21 0603 09/01/21 1123 09/01/21 2055 09/02/21 0627  GLUCAP 170* 139* 166*  221* 95   D-Dimer No results for input(s): DDIMER in the last 72 hours. Hgb A1c No results for input(s): HGBA1C in the last 72 hours. Lipid Profile No results for input(s): CHOL, HDL, LDLCALC, TRIG, CHOLHDL, LDLDIRECT in the last 72 hours. Thyroid function studies No results for input(s): TSH, T4TOTAL, T3FREE, THYROIDAB in the last 72 hours.  Invalid input(s): FREET3 Anemia work up No results for input(s): VITAMINB12, FOLATE, FERRITIN, TIBC, IRON, RETICCTPCT in the last 72 hours. Urinalysis    Component Value Date/Time   COLORURINE YELLOW 07/15/2015 1440   APPEARANCEUR CLEAR 07/15/2015 1440   LABSPEC 1.015 07/15/2015 1440   PHURINE 6.0 07/15/2015 1440   GLUCOSEU NEGATIVE 07/15/2015 1440   HGBUR TRACE-LYSED (A) 07/15/2015 1440   BILIRUBINUR NEGATIVE 07/15/2015 1440   KETONESUR TRACE (A) 07/15/2015 1440   PROTEINUR NEGATIVE 07/06/2012 0341   UROBILINOGEN 1.0 07/15/2015 1440   NITRITE NEGATIVE 07/15/2015 1440   LEUKOCYTESUR NEGATIVE 07/15/2015 1440   Sepsis Labs Invalid input(s): PROCALCITONIN,  WBC,  LACTICIDVEN Microbiology Recent Results (from the past 240 hour(s))  Resp Panel by RT-PCR (Flu A&B, Covid) Nasopharyngeal Swab     Status: None   Collection Time: 08/29/21  5:06 PM   Specimen: Nasopharyngeal Swab; Nasopharyngeal(NP) swabs in vial transport medium  Result Value  Ref Range Status   SARS Coronavirus 2 by RT PCR NEGATIVE NEGATIVE Final    Comment: (NOTE) SARS-CoV-2 target nucleic acids are NOT DETECTED.  The SARS-CoV-2 RNA is generally detectable in upper respiratory specimens during the acute phase of infection. The lowest concentration of SARS-CoV-2 viral copies this assay can detect is 138 copies/mL. A negative result does not preclude SARS-Cov-2 infection and should not be used as the sole basis for treatment or other patient management decisions. A negative result may occur with  improper specimen collection/handling, submission of specimen other than  nasopharyngeal swab, presence of viral mutation(s) within the areas targeted by this assay, and inadequate number of viral copies(<138 copies/mL). A negative result must be combined with clinical observations, patient history, and epidemiological information. The expected result is Negative.  Fact Sheet for Patients:  EntrepreneurPulse.com.au  Fact Sheet for Healthcare Providers:  IncredibleEmployment.be  This test is no t yet approved or cleared by the [place] FDA and  has been authorized for detection and/or diagnosis of SARS-CoV-2 by FDA under an Emergency Use Authorization (EUA). This EUA will remain  in effect (meaning this test can be used) for the duration of the COVID-19 declaration under Section 564(b)(1) of the Act, 21 U.S.C.section 360bbb-3(b)(1), unless the authorization is terminated  or revoked sooner.       Influenza A by PCR NEGATIVE NEGATIVE Final   Influenza B by PCR NEGATIVE NEGATIVE Final    Comment: (NOTE) The Xpert Xpress SARS-CoV-2/FLU/RSV plus assay is intended as an aid in the diagnosis of influenza from Nasopharyngeal swab specimens and should not be used as a sole basis for treatment. Nasal washings and aspirates are unacceptable for Xpert Xpress SARS-CoV-2/FLU/RSV testing.  Fact Sheet for Patients: EntrepreneurPulse.com.au  Fact Sheet for Healthcare Providers: IncredibleEmployment.be  This test is not yet approved or cleared by the [place] FDA and has been authorized for detection and/or diagnosis of SARS-CoV-2 by FDA under an Emergency Use Authorization (EUA). This EUA will remain in effect (meaning this test can be used) for the duration of the COVID-19 declaration under Section 564(b)(1) of the Act, 21 U.S.C. section 360bbb-3(b)(1), unless the authorization is terminated or revoked.  Performed at [facility], [place] [address], [place], [place] [address]    Expectorated Sputum Assessment w Gram Stain, Rflx to Resp Cult     Status: None   Collection Time: 08/31/21  5:50 AM   Specimen: Sputum  Result Value Ref Range Status   Specimen Description SPUTUM  Final   Special Requests NONE  Final   Sputum evaluation   Final    THIS SPECIMEN IS ACCEPTABLE FOR SPUTUM CULTURE Performed at [facility], [place] [address] [place], [place] [address]    Report Status 08/31/2021 FINAL  Final  Culture, Respiratory w Gram Stain     Status: None   Collection Time: 08/31/21  5:50 AM   Specimen: SPU  Result Value Ref Range Status   Specimen Description SPUTUM  Final   Special Requests NONE Reflexed from [id number]  Final   Gram Stain   Final    FEW SQUAMOUS EPITHELIAL CELLS PRESENT FEW WBC SEEN FEW GRAM POSITIVE RODS FEW GRAM POSITIVE COCCI    Culture   Final    FEW Normal respiratory flora-no Staph aureus or Pseudomonas seen Performed at [facility], [place] [address], [place], [place] [address]    Report Status 09/02/2021 FINAL  Final     Time coordinating discharge: Over 36  minutes  SIGNED:   Darliss Cheney, MD  [facility] 09/02/2021, 10:07 AM  If 7PM-7AM, please contact night-coverage [email or web address]

## 2021-09-02 NOTE — TOC Initial Note (Signed)
Transition of Care (TOC) - Initial/Assessment Note    Patient Details  Name: Bradley [name]. MRN: [id number] Date of Birth: [birth year]  [facility]) CM/SW Contact:    Leone Haven, RN Phone Number: 09/02/2021, 10:33 AM  Clinical Narrative:                 Patient is from home, for dc today, has no needs.   Expected Discharge Plan: Home/Self Care Barriers to Discharge: No Barriers Identified   Patient Goals and CMS Choice Patient states their goals for this hospitalization and ongoing recovery are:: return home   Choice offered to / list presented to : NA  Expected Discharge Plan and Services Expected Discharge Plan: Home/Self Care   Discharge Planning Services: CM Consult     Expected Discharge Date: 09/02/21                 DME Agency: NA                  Prior Living Arrangements/Services   Lives with:: Spouse Patient language and need for interpreter reviewed:: Yes        Need for Family Participation in Patient Care: Yes (Comment)     Criminal Activity/Legal Involvement Pertinent to Current Situation/Hospitalization: No - Comment as needed  Activities of Daily Living      Permission Sought/Granted                  Emotional Assessment       Orientation: : Oriented to Place, Oriented to Self, Oriented to  Time, Oriented to Situation Alcohol / Substance Use: Not Applicable Psych Involvement: No (comment)  Admission diagnosis:  COPD exacerbation (HCC) [J44.1] COPD with acute exacerbation (HCC) [J44.1] Patient Active Problem List   Diagnosis Date Noted   GERD without esophagitis 07/30/2021   Cor pulmonale (chronic) (HCC) 08/05/2020   COPD exacerbation (HCC) 12/22/2018   COPD GOLD 0 spirometry/ AB  12/22/2018   PCP NOTES >>> 07/15/2015   Annual physical exam 05/08/2012   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   Sinoatrial node dysfunction (HCC)    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   Acute on chronic  diastolic CHF (congestive heart failure) (HCC)    PAD (peripheral artery disease) (HCC) 10/22/2010   DERMATITIS, STASIS 09/14/2010   Chronic respiratory failure with hypoxia and hypercapnia (HCC) 09/14/2010   Chronic pulmonary hypertension secondary to elevated L H pressures  04/15/2009   ERECTILE DYSFUNCTION 01/06/2009   Mixed diabetic hyperlipidemia associated with type 2 diabetes mellitus (HCC) 12/13/2007   Essential hypertension 12/13/2007   Anxiety--insomnia 08/14/2007   Type 2 diabetes mellitus without complication, without long-term current use of insulin (HCC) 01/02/2007   Chronic atrial fibrillation (HCC) 01/02/2007   Benign enlargement of prostate 01/02/2007   PCP:  [name], MD Pharmacy:   [facility] [address] [place] [address] Phone: [phone] Fax: [phone]     Social Determinants of Health (SDOH) Interventions    Readmission Risk Interventions Readmission Risk Prevention Plan 09/02/2021 08/03/2021  Transportation Screening Complete Complete  PCP or Specialist Appt within 5-7 Days - Complete  PCP or Specialist Appt within 3-5 Days Complete -  Home Care Screening - Complete  Medication Review (RN CM) - Complete  HRI or Home Care Consult Complete -  Social Work Consult for Recovery Care Planning/Counseling Complete -  Palliative Care Screening Not Applicable -  Medication Review [employer]) Complete -  Some recent data  might be hidden

## 2021-09-07 ENCOUNTER — Other Ambulatory Visit: Payer: Self-pay

## 2021-09-07 ENCOUNTER — Telehealth: Payer: Self-pay | Admitting: Internal Medicine

## 2021-09-07 ENCOUNTER — Telehealth: Payer: Self-pay

## 2021-09-07 ENCOUNTER — Ambulatory Visit (HOSPITAL_COMMUNITY)
Admit: 2021-09-07 | Discharge: 2021-09-07 | Disposition: A | Discharge status: Home / Self Care | Payer: Medicare Other | Attending: Adult Health | Admitting: Adult Health

## 2021-09-07 ENCOUNTER — Telehealth (HOSPITAL_COMMUNITY): Payer: Self-pay | Admitting: Licensed Clinical Social Worker

## 2021-09-07 VITALS — BP 130/80 | HR 77 | Wt 185.8 lb

## 2021-09-07 DIAGNOSIS — E1151 Type 2 diabetes mellitus with diabetic peripheral angiopathy without gangrene: Secondary | ICD-10-CM | POA: Diagnosis not present

## 2021-09-07 DIAGNOSIS — Z7984 Long term (current) use of oral hypoglycemic drugs: Secondary | ICD-10-CM | POA: Diagnosis not present

## 2021-09-07 DIAGNOSIS — I110 Hypertensive heart disease with heart failure: Secondary | ICD-10-CM | POA: Diagnosis present

## 2021-09-07 DIAGNOSIS — Z9981 Dependence on supplemental oxygen: Secondary | ICD-10-CM | POA: Insufficient documentation

## 2021-09-07 DIAGNOSIS — I5032 Chronic diastolic (congestive) heart failure: Secondary | ICD-10-CM

## 2021-09-07 DIAGNOSIS — J449 Chronic obstructive pulmonary disease, unspecified: Secondary | ICD-10-CM | POA: Diagnosis not present

## 2021-09-07 DIAGNOSIS — Z79899 Other long term (current) drug therapy: Secondary | ICD-10-CM | POA: Insufficient documentation

## 2021-09-07 DIAGNOSIS — I619 Nontraumatic intracerebral hemorrhage, unspecified: Secondary | ICD-10-CM | POA: Insufficient documentation

## 2021-09-07 DIAGNOSIS — I2510 Atherosclerotic heart disease of native coronary artery without angina pectoris: Secondary | ICD-10-CM | POA: Insufficient documentation

## 2021-09-07 DIAGNOSIS — I428 Other cardiomyopathies: Secondary | ICD-10-CM | POA: Diagnosis not present

## 2021-09-07 DIAGNOSIS — Z950 Presence of cardiac pacemaker: Secondary | ICD-10-CM | POA: Diagnosis not present

## 2021-09-07 DIAGNOSIS — I350 Nonrheumatic aortic (valve) stenosis: Secondary | ICD-10-CM | POA: Diagnosis not present

## 2021-09-07 DIAGNOSIS — I482 Chronic atrial fibrillation, unspecified: Secondary | ICD-10-CM | POA: Insufficient documentation

## 2021-09-07 DIAGNOSIS — Z87891 Personal history of nicotine dependence: Secondary | ICD-10-CM | POA: Insufficient documentation

## 2021-09-07 DIAGNOSIS — E785 Hyperlipidemia, unspecified: Secondary | ICD-10-CM | POA: Insufficient documentation

## 2021-09-07 DIAGNOSIS — J9611 Chronic respiratory failure with hypoxia: Secondary | ICD-10-CM | POA: Insufficient documentation

## 2021-09-07 LAB — BASIC METABOLIC PANEL
Anion gap: 7 (ref 5–15)
BUN: 15 mg/dL (ref 8–23)
CO2: 35 mmol/L — ABNORMAL HIGH (ref 22–32)
Calcium: 8.7 mg/dL — ABNORMAL LOW (ref 8.9–10.3)
Chloride: 96 mmol/L — ABNORMAL LOW (ref 98–111)
Creatinine, Ser: 0.83 mg/dL (ref 0.61–1.24)
GFR, Estimated: >60 mL/min (ref >60)
Glucose, Bld: 144 mg/dL — ABNORMAL HIGH (ref 70–99)
Potassium: 4.6 mmol/L (ref 3.5–5.1)
Sodium: 138 mmol/L (ref 135–145)

## 2021-09-07 MED ORDER — DAPAGLIFLOZIN PROPANEDIOL 10 MG PO TABS: 10.0000 mg | ORAL_TABLET | Freq: Every day | ORAL | 3 refills | Status: DC

## 2021-09-07 MED ORDER — NITROGLYCERIN 0.4 MG SL SUBL: 0.4000 mg | SUBLINGUAL_TABLET | SUBLINGUAL | 3 refills | Status: DC | PRN

## 2021-09-07 NOTE — Telephone Encounter (Signed)
CSW called pt to remind of appt with [facility] clinic today.  Pt wife answered and confirmed they will be attending appt.  Burna Sis, LCSW Clinical Social Worker [facility] Desk#: [phone] Cell#: [phone]

## 2021-09-07 NOTE — Telephone Encounter (Signed)
Medication: nitroGLYCERIN (NITROSTAT) 0.4 MG SL tablet  Has the patient contacted their pharmacy? Yes.   (If no, request that the patient contact the pharmacy for the refill.) (If yes, when and what did the pharmacy advise?)  Preferred Pharmacy (with phone number or street name):  [facility] - [place], [place] - [address]  [address], [place] [address]  Phone:  [phone]  Fax:  [phone]   Agent: Please be advised that RX refills may take up to 3 business days. We ask that you follow-up with your pharmacy.

## 2021-09-07 NOTE — Telephone Encounter (Signed)
Transition Care Management Unsuccessful Follow-up Telephone Call  Date of discharge and from where:  09/02/2021-[facility]   Attempts:  1st Attempt  Reason for unsuccessful TCM follow-up call:  Left voice message    

## 2021-09-07 NOTE — Patient Instructions (Addendum)
Start Farxiga 10mg  Daily.  Labs done today, your results will be available in MyChart, we will contact you for abnormal readings.  Thank you for allowing to provider your heart failure care after your recent hospitalization. Please follow-up with your cardiologist as scheduled.  If you have any questions, issues, or concerns before your next appointment please call our office at [phone], opt. 2 and leave a message for the triage nurse.

## 2021-09-07 NOTE — Telephone Encounter (Signed)
Rx sent 

## 2021-09-07 NOTE — Progress Notes (Signed)
HEART & VASCULAR TRANSITION OF CARE CONSULT NOTE     Referring Physician: Dr Jacqulyn Bath Primary Care: Dr Drue Novel  Primary Cardiologist: Dr Duke Salvia EP : Dr Ladona Ridgel   HPI: Referred to clinic by Dr Jacqulyn Bath for heart failure consultation.   [name] is a 80 year old with a history of chronin a fib, S/P AVN ablation and Medtronic CRT-P 1-0 years ago, mild AS, CAD, HTN, hyperlipidemia, ICH not on anticoagulants, COPD, on chronic oxygen, PAD, and former heavy smoker.   Admitted 07/29/2021 with A/C respiratory failure and A/C HFpEF. Treated with steroids and IV lasix. Transitioned to 80 mg daily and on M-W-W 80 /40 mg .   Admitted 11/19/222 with increased shortness of breath in the setting of AECOPD and A/C HFpEF.  Placed on steroids and diuresed with IV lasix. Discharged on oral prednisone and doxycyline. Also discharged on lasix 80 mg daily except M-W-F take 80 mg /40 mg. Discharge weight 183 pounds.   Presents with his wife. Walked in the clinic.  Overall feeling fine. Chronic SOB with exertion. Denies PND/Orthopnea. On 3 liters out and 4 liters at home. Appetite ok. No fever or chills. Weight at home 170-175 pounds. Taking all medications.  Cardiac Testing  LHC 2005 and 2018 - no significant CAD.   Echo 07/30/2021 EF 55-60% RV normal . RA/LA moderately dilated.   Review of Systems: [y] = yes, [ ]  = no   General: Weight gain [ ] ; Weight loss [ ] ; Anorexia [ ] ; Fatigue [ ] ; Fever [ ] ; Chills [ ] ; Weakness [ ]   Cardiac: Chest pain/pressure [ ] ; Resting SOB [ ] ; Exertional SOB [ Y]; Orthopnea [ ] ; Pedal Edema [ ] ; Palpitations [ ] ; Syncope [ ] ; Presyncope [ ] ; Paroxysmal nocturnal dyspnea[ ]   Pulmonary: Cough [ ] ; Wheezing[ ] ; Hemoptysis[ ] ; Sputum [ ] ; Snoring [ ]   GI: Vomiting[ ] ; Dysphagia[ ] ; Melena[ ] ; Hematochezia [ ] ; Heartburn[ ] ; Abdominal pain [ ] ; Constipation [ ] ; Diarrhea [ ] ; BRBPR [ ]   GU: Hematuria[ ] ; Dysuria [ ] ; Nocturia[ ]   Vascular: Pain in legs with walking [ ] ; Pain in  feet with lying flat [ ] ; Non-healing sores [ ] ; Stroke [ ] ; TIA [ ] ; Slurred speech [ ] ;  Neuro: Headaches[ ] ; Vertigo[ ] ; Seizures[ ] ; Paresthesias[ ] ;Blurred vision [ ] ; Diplopia [ ] ; Vision changes [ ]   Ortho/Skin: Arthritis [ ] ; Joint pain [ Y]; Muscle pain [ ] ; Joint swelling [ ] ; Back Pain [ ] ; Rash [ ]   Psych: Depression[ ] ; Anxiety[ ]   Heme: Bleeding problems [ ] ; Clotting disorders [ ] ; Anemia [ ]   Endocrine: Diabetes [ Y]; Thyroid dysfunction[ ]    Past Medical History:  Diagnosis Date   Abnormal CT scan, chest 2012   CT chest, several lymphadenopathies. Sees pulmonary   Anemia    intermittent   Arthritis    "?back" (09/19/2018)   Atrial fibrillation (HCC)    s/p AV node ablation & BiV PPM implantation 09/08/11 (op dictation pending)   BPH (benign prostatic hyperplasia)    Saw Dr 2004, normal renal u/s   Bronchitis 08/26/2017   CAD (coronary artery disease)    CHF (congestive heart failure) (HCC)    Thought primarily to be non-systolic although EF down (EF 12/2010, down to 35-40% 09/05/11), cath 2008 with no CAD, nuclear study 07/2011 showing Small area of reversibility in the distal ant/lat wall the left ventricle suspicious for ischemia/septal wall HK but felt to be low risk  (  per D/C Summary 07/2011)   Chronic bronchitis ([place])    "get it ~ q yr"   Chronic respiratory failure with hypoxia and hypercapnia ([place]) 09/14/2010   Followed in Pulmonary clinic/ [facility]/ [facility]  Started on 02 2lpm at discharge 09/23/11   - PFT's 10/28/2011  FEV1  1.40 (51%)  with ratio 70 and no better p B2 and DLCO 53 corrects to 101    - PFT's 10/18/2014    FEV1 1.71 (59%) with ratio 68 and no sign change p B2 and DLCO 53 corrects to 85  -  HC03   07/28/20  = 33  -  HCO3   03/17/21     = 31     Heart murmur    HLD (hyperlipidemia)    HTN (hypertension)    ICB (intracranial bleed) ([place]) 06/2012   d/c coumadin permanently   Insomnia    Migraines    "very very rare"   On home  oxygen therapy    "2L; 24/7" (09/19/2018)   Pacemaker    Peripheral vascular disease ([place])    ??   Pleural effusion 2008   S/p decortication   Pneumonia 08/02/2011   Pulmonary HTN ([place])    per cath 2008   Type II diabetes mellitus (HCC) 1999    Current Outpatient Medications  Medication Sig Dispense Refill   acetaminophen (TYLENOL) 500 MG tablet Take 500 mg by mouth every 6 (six) hours as needed for headache or moderate pain.     albuterol (VENTOLIN HFA) 108 (90 Base) MCG/ACT inhaler Inhale 2 puffs into the lungs every 6 (six) hours as needed for wheezing or shortness of breath. 8.5 g 0   amLODipine (NORVASC) 10 MG tablet Take 10 mg by mouth daily.     aspirin 81 MG tablet Take 81 mg by mouth every morning.      atorvastatin (LIPITOR) 20 MG tablet TAKE 1 TABLET AT BEDTIME 90 tablet 1   budesonide (PULMICORT) 0.5 MG/2ML nebulizer solution Take 2 mLs (0.5 mg total) by nebulization 2 (two) times daily. 360 mL 1   calcium-vitamin D (OSCAL WITH D) 500-200 MG-UNIT tablet Take 1 tablet by mouth every morning.     carvedilol (COREG) 25 MG tablet TAKE 1 TABLET TWICE A DAY WITH MEALS 180 tablet 1   cholecalciferol (VITAMIN D3) 25 MCG (1000 UNIT) tablet Take 1,000 Units by mouth daily.     diphenhydramine-acetaminophen (TYLENOL PM) 25-500 MG TABS tablet Take 1 tablet by mouth at bedtime as needed.     furosemide (LASIX) 40 MG tablet TAKE 2 TABLETS DAILY EXCEPT ON MONDAY, WEDNESDAY, AND FRIDAY TAKE 2 TABLETS IN THE MORNING AND 1 TABLET IN THE AFTERNOON 270 tablet 1   ipratropium-albuterol (DUONEB) 0.5-2.5 (3) MG/3ML SOLN Take 3 mLs by nebulization 2 (two) times daily as needed. 540 mL 1   Lancets (ONETOUCH ULTRASOFT) lancets Check blood sugars no more than twice daily 200 each 12   loratadine (CLARITIN) 10 MG tablet Take 1 tablet (10 mg total) by mouth daily.     losartan (COZAAR) 50 MG tablet Take 0.5 tablets (25 mg total) by mouth daily. 90 tablet 1   Multiple Vitamins-Minerals (MULTIVITAMINS  THER. W/MINERALS) TABS tablet Take 1 tablet by mouth daily. 30 each 5   nitroGLYCERIN (NITROSTAT) 0.4 MG SL tablet Place 1 tablet (0.4 mg total) under the tongue every 5 (five) minutes x 3 doses as needed for chest pain. 25 tablet 3   ONETOUCH VERIO test strip USE TO CHECK  BLOOD SUGAR NO MORE THAN TWICE A DAY 200 strip 12   pantoprazole (PROTONIX) 40 MG tablet Take 40 mg by mouth daily.     sitaGLIPtin (JANUVIA) 100 MG tablet TAKE 1 TABLET DAILY 90 tablet 1   tamsulosin (FLOMAX) 0.4 MG CAPS capsule TAKE 1 CAPSULE DAILY AFTER SUPPER 90 capsule 3   zolpidem (AMBIEN) 10 MG tablet Take 10 mg by mouth at bedtime.     No current facility-administered medications for this encounter.    Allergies  Allergen Reactions   Hydrocodone Other (See Comments)    "given to him in the hospital; went thru withdrawals once home; dr said not to take it again" (09/27/2012)   Tramadol Other (See Comments)    "given to him in the hospital; went thru withdrawals once home; dr said not to take it again" (09/27/2012)   Tadalafil Other (See Comments)    headache, backache   Avelox [Moxifloxacin Hydrochloride] Itching and Other (See Comments)    Headache      Social History   Socioeconomic History   Marital status: Married    Spouse name: [place]   Number of children: 1   Years of education: Not on file   Highest education level: Not on file  Occupational History   Occupation: retired Magazine features editor: RETIRED  Tobacco Use   Smoking status: Former    Packs/day: 2.50    Years: 25.00    Pack years: 62.50    Types: Cigarettes, Cigars    Quit date: 10/11/1980    Years since quitting: 40.9   Smokeless tobacco: Never  Vaping Use   Vaping Use: Never used  Substance and Sexual Activity   Alcohol use: Not Currently    Comment: 1 can of beer ever 4-5 months   Drug use: No   Sexual activity: Not Currently  Other Topics Concern   Not on file  Social History Narrative   [place]oved from [place]    He lives w/ his wife, [place]   1 son, substance abuse, passed away 10-11-16         Social Determinants of Health   Financial Resource Strain: Low Risk    Difficulty of Paying Living Expenses: Not hard at all  Food Insecurity: No Food Insecurity   Worried About Charity fundraiser in the Last Year: Never true   Arboriculturist in the Last Year: Never true  Transportation Needs: No Transportation Needs   Lack of Transportation (Medical): No   Lack of Transportation (Non-Medical): No  Physical Activity: Insufficiently Active   Days of Exercise per Week: 5 days   Minutes of Exercise per Session: 20 min  Stress: No Stress Concern Present   Feeling of Stress : Not at all  Social Connections: Moderately Integrated   Frequency of Communication with Friends and Family: More than three times a week   Frequency of Social Gatherings with Friends and Family: More than three times a week   Attends Religious Services: More than 4 times per year   Active Member of [employer] or Organizations: No   Attends Archivist Meetings: Never   Marital Status: Married  Human resources officer Violence: Not At Risk   Fear of Current or Ex-Partner: No   Emotionally Abused: No   Physically Abused: No   Sexually Abused: No      Family History  Problem Relation Age of Onset   Diabetes Father    Coronary artery disease Father  Polycythemia Mother    Drug abuse Son    Breast cancer Maternal Aunt    Prostate cancer Neg Hx    Colon cancer Neg Hx     Vitals:   09/07/21 1158  BP: 130/80  Pulse: 77  SpO2: 95%  Weight: 84.3 kg (185 lb 12.8 oz)   Wt Readings from Last 3 Encounters:  09/07/21 84.3 kg (185 lb 12.8 oz)  09/02/21 83.1 kg (183 lb 1.6 oz)  08/11/21 86.5 kg (190 lb 9.6 oz)    PHYSICAL EXAM: General:  Walked in the clinic. No respiratory difficulty HEENT: normal Neck: supple. JVP 7-8 . Carotids 2+ bilat; no bruits. No lymphadenopathy or thryomegaly appreciated. Cor: PMI  nondisplaced. Regular rate & rhythm. No rubs, gallops or murmurs. Lungs: No wheeze. On 3 liters [place]  Abdomen: soft, nontender, nondistended. No hepatosplenomegaly. No bruits or masses. Good bowel sounds. Extremities: no cyanosis, clubbing, rash, edema Neuro: alert & oriented x 3, cranial nerves grossly intact. moves all 4 extremities w/o difficulty. Affect pleasant.  ASSESSMENT & PLAN: Chronic HFpEF -07/2021 Echo Ef 55-60% RV normal, RA/LA moderately dilated  -NYHA III. Volume status stable. Continue lasix 80 mg daily and on M-W-F 80mg /40 mg  - Continue carvedilol 25 mg twice a day  - Continue current dose of losartan.  - Consider spiro at his follow up with Dr Oval Linsey.  -Add farxiga 10 mg daily. Hopefully this will help with volume overload. Given co-pay card for farxiga.  - Check BMET  - Discussed low salt food choices and limiting fluid intake to < 2 liter per day.   2. Chronic A fib  -Not on anticoagulants due to [place].  -Rate controlled on carvedilol 25 mg twice a day  - Followed by Dr Lovena Le   3. DMII  - 07/30/21 Hgb A1C 5.9  -Add farxiga 10 mg daily for HF. Discussed with pharmacy team. Ok to add with [place].   4. COPD, Chronic Hypoxic Respiratory Failure On 3-4 liters continuous oxygen. Sats stable today. No wheeze.     Referred to HFSW (PCP, Medications, Transportation, ETOH Abuse, Drug Abuse, Insurance, Financial ): No  Refer to Pharmacy: No  Refer to Home Health:  No Refer to Advanced Heart Failure Clinic: No  Refer to General Cardiology: Established with Dr Oval Linsey and has follow up next week.   Follow up as needed. If admitted again will need to refer to [facility].   Joeanna Howdyshell NP-C  1:09 PM

## 2021-09-08 ENCOUNTER — Other Ambulatory Visit (HOSPITAL_COMMUNITY): Payer: Self-pay

## 2021-09-08 ENCOUNTER — Telehealth (HOSPITAL_COMMUNITY): Payer: Self-pay

## 2021-09-08 NOTE — Telephone Encounter (Signed)
Heart Failure Patient Advocate Encounter   Received notification from [employer] that prior authorization for [name] is required.   PA submitted on CoverMyMeds Key [id number] Status is approved through 09/08/2022  Copay $15 per month  Contacted pt and informed his wife of the update.  Sharen Hones, PharmD, BCPS Heart Failure Stewardship Pharmacist Phone [phone]

## 2021-09-08 NOTE — Telephone Encounter (Signed)
Transition Care Management Follow-up Telephone Call Date of discharge and from where: 10/02/2021-[facility] How have you been since you were released from the hospital? Doing fine Any questions or concerns? No  Items Reviewed: Did the pt receive and understand the discharge instructions provided? Yes  Medications obtained and verified? Yes  Other? Yes  Any new allergies since your discharge? No  Dietary orders reviewed? Yes Do you have support at home? Yes   Home Care and Equipment/Supplies: Were home health services ordered? no If so, what is the name of the agency? N/a  Has the agency set up a time to come to the patient's home? not applicable Were any new equipment or medical supplies ordered?  No What is the name of the medical supply agency? N/a Were you able to get the supplies/equipment? not applicable Do you have any questions related to the use of the equipment or supplies? N/a  Functional Questionnaire: (I = Independent and D = Dependent) ADLs: I  Bathing/Dressing- I  Meal Prep- I  Eating- I  Maintaining continence- I  Transferring/Ambulation- I  Managing Meds- I  Follow up appointments reviewed:  PCP Hospital f/u appt confirmed? Yes  Scheduled to see [name] on 09/14/2021  @ 11:20. Specialist Hospital f/u appt confirmed? Yes  Scheduled to see [name] on 09/17/2021 @ 8:00. Are transportation arrangements needed? No  If their condition worsens, is the pt aware to call PCP or go to the Emergency Dept.? Yes Was the patient provided with contact information for the PCP's office or ED? No Was to pt encouraged to call back with questions or concerns? Yes

## 2021-09-09 DIAGNOSIS — J449 Chronic obstructive pulmonary disease, unspecified: Secondary | ICD-10-CM

## 2021-09-09 DIAGNOSIS — Z7984 Long term (current) use of oral hypoglycemic drugs: Secondary | ICD-10-CM

## 2021-09-09 DIAGNOSIS — E1159 Type 2 diabetes mellitus with other circulatory complications: Secondary | ICD-10-CM

## 2021-09-09 DIAGNOSIS — I509 Heart failure, unspecified: Secondary | ICD-10-CM | POA: Diagnosis not present

## 2021-09-14 ENCOUNTER — Inpatient Hospital Stay: Payer: BLUE CROSS/BLUE SHIELD | Admitting: Internal Medicine

## 2021-09-14 ENCOUNTER — Ambulatory Visit (INDEPENDENT_AMBULATORY_CARE_PROVIDER_SITE_OTHER): Payer: Medicare Other | Admitting: Internal Medicine

## 2021-09-14 ENCOUNTER — Telehealth: Payer: BLUE CROSS/BLUE SHIELD

## 2021-09-14 ENCOUNTER — Encounter: Payer: Self-pay | Admitting: Internal Medicine

## 2021-09-14 VITALS — BP 126/72 | HR 69 | Temp 98.2°F | Resp 20 | Ht 69.0 in | Wt 190.4 lb

## 2021-09-14 DIAGNOSIS — J441 Chronic obstructive pulmonary disease with (acute) exacerbation: Secondary | ICD-10-CM | POA: Diagnosis not present

## 2021-09-14 DIAGNOSIS — J9611 Chronic respiratory failure with hypoxia: Secondary | ICD-10-CM

## 2021-09-14 NOTE — Progress Notes (Signed)
Subjective:    Patient ID: Bradley [name]., male    DOB: April 08, 1941, 80 y.o.   MRN: [id number]  DOS:  09/14/2021 Type of visit - description: TCM 14  Since the last office visit, he was readmitted to the hospital and discharge 09/02/2021: Initial symptoms-shortness of breath, found to have acute on chronic hypoxic respiratory failure due to COPD exacerbation and CHF decompensation. He improved while in the hospital, felt to be back to baseline, was released home. Labs reviewed, Last creatinine 0.8, last calcium 8.7 is slightly low.  Last chest x-ray 08/31/2021:    There are patchy infiltrates in the lower lung fields, more so on the left side with interval worsening suggesting worsening of atelectasis/pneumonia. Small bilateral pleural effusions.  Wt Readings from Last 3 Encounters:  09/14/21 190 lb 6 oz (86.4 kg)  09/07/21 185 lb 12.8 oz (84.3 kg)  09/02/21 183 lb 1.6 oz (83.1 kg)     Review of Systems Since he left the hospital, saw cardiology, [name] was added. Reports that at home, he is doing well. His weight is checked frequently and it goes up or down by 3 pounds, no major shift on his weight. No fever chills No lower extremity edema No cough No nausea or vomiting.    Past Medical History:  Diagnosis Date   Abnormal CT scan, chest 2012   CT chest, several lymphadenopathies. Sees pulmonary   Anemia    intermittent   Arthritis    "?back" (09/19/2018)   Atrial fibrillation (HCC)    s/p AV node ablation & BiV PPM implantation 09/08/11 (op dictation pending)   BPH (benign prostatic hyperplasia)    Saw Dr Reece Agar 2004, normal renal u/s   Bronchitis 08/26/2017   CAD (coronary artery disease)    CHF (congestive heart failure) ([place])    Thought primarily to be non-systolic although EF down (EF 45-50% 12/2010, down to 35-40% 09/05/11), cath 2008 with no CAD, nuclear study 07/2011 showing Small area of reversibility in the distal ant/lat wall the left ventricle  suspicious for ischemia/septal wall HK but felt to be low risk  (per D/C Summary 07/2011)   Chronic bronchitis ([place])    "get it ~ q yr"   Chronic respiratory failure with hypoxia and hypercapnia ([place]) 09/14/2010   Followed in Pulmonary clinic/ [facility]/ [facility]  Started on 02 2lpm at discharge 09/23/11   - PFT's 10/28/2011  FEV1  1.40 (51%)  with ratio 70 and no better p B2 and DLCO 53 corrects to 101    - PFT's 10/18/2014    FEV1 1.71 (59%) with ratio 68 and no sign change p B2 and DLCO 53 corrects to 85  -  HC03   07/28/20  = 33  -  HCO3   03/17/21     = 31     Heart murmur    HLD (hyperlipidemia)    HTN (hypertension)    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   Pleural effusion 2008   S/p decortication   Pneumonia 08/02/2011   Pulmonary HTN ([place])    per cath 2008   Type II diabetes mellitus ([place]) 1999    Past Surgical History:  Procedure Laterality Date   BI-VENTRICULAR PACEMAKER INSERTION Left 09/08/2011  Procedure: BI-VENTRICULAR PACEMAKER INSERTION (CRT-P);  Surgeon: Evans Lance, MD;  Location: [facility] CATH LAB;  Service: Cardiovascular;  Laterality: Left;   CARDIAC CATHETERIZATION     "couple times; never had balloon or stent" (09/19/2018)   CATARACT EXTRACTION W/ INTRAOCULAR LENS  IMPLANT, BILATERAL Bilateral 08/2018   COLONOSCOPY  03/10/11   normal   INSERT / REPLACE / REMOVE PACEMAKER  09/08/11   pacemaker placement   LUNG DECORTICATION     PLEURAL SCARIFICATION     pneumothorax with fibrothorax  ~ 2010   TONSILLECTOMY     "as a kid"     Allergies as of 09/14/2021       Reactions   Hydrocodone Other (See Comments)   "given to him in the hospital; went thru withdrawals once home; dr said not to take it again" (09/27/2012)   Tramadol Other (See Comments)   "given to him in the hospital;  went thru withdrawals once home; dr said not to take it again" (09/27/2012)   Tadalafil Other (See Comments)   headache, backache   Avelox [moxifloxacin Hydrochloride] Itching, Other (See Comments)   Headache        Medication List        Accurate as of September 14, 2021 11:59 PM. If you have any questions, ask your nurse or doctor.          acetaminophen 500 MG tablet Commonly known as: TYLENOL Take 500 mg by mouth every 6 (six) hours as needed for headache or moderate pain.   albuterol 108 (90 Base) MCG/ACT inhaler Commonly known as: VENTOLIN HFA Inhale 2 puffs into the lungs every 6 (six) hours as needed for wheezing or shortness of breath.   amLODipine 10 MG tablet Commonly known as: NORVASC Take 10 mg by mouth daily.   aspirin 81 MG tablet Take 81 mg by mouth every morning.   atorvastatin 20 MG tablet Commonly known as: LIPITOR TAKE 1 TABLET AT BEDTIME   budesonide 0.5 MG/2ML nebulizer solution Commonly known as: PULMICORT Take 2 mLs (0.5 mg total) by nebulization 2 (two) times daily.   calcium-vitamin D 500-200 MG-UNIT tablet Commonly known as: OSCAL WITH D Take 1 tablet by mouth every morning.   carvedilol 25 MG tablet Commonly known as: COREG TAKE 1 TABLET TWICE A DAY WITH MEALS   cholecalciferol 25 MCG (1000 UNIT) tablet Commonly known as: VITAMIN D3 Take 1,000 Units by mouth daily.   dapagliflozin propanediol 10 MG Tabs tablet Commonly known as: Farxiga Take 1 tablet (10 mg total) by mouth daily before breakfast.   diphenhydramine-acetaminophen 25-500 MG Tabs tablet Commonly known as: TYLENOL PM Take 1 tablet by mouth at bedtime as needed.   furosemide 40 MG tablet Commonly known as: LASIX TAKE 2 TABLETS DAILY EXCEPT ON MONDAY, WEDNESDAY, AND FRIDAY TAKE 2 TABLETS IN THE MORNING AND 1 TABLET IN THE AFTERNOON   ipratropium-albuterol 0.5-2.5 (3) MG/3ML Soln Commonly known as: DUONEB Take 3 mLs by nebulization 2 (two) times daily as needed.    Januvia 100 MG tablet Generic drug: sitaGLIPtin TAKE 1 TABLET DAILY   loratadine 10 MG tablet Commonly known as: CLARITIN Take 1 tablet (10 mg total) by mouth daily.   losartan 50 MG tablet Commonly known as: COZAAR Take 0.5 tablets (25 mg total) by mouth daily.   multivitamins ther. w/minerals Tabs tablet Take 1 tablet by mouth daily.   nitroGLYCERIN 0.4 MG SL tablet Commonly known as: NITROSTAT Place 1 tablet (0.4 mg total) under the tongue every  5 (five) minutes x 3 doses as needed for chest pain.   onetouch ultrasoft lancets Check blood sugars no more than twice daily   OneTouch Verio test strip Generic drug: glucose blood USE TO CHECK BLOOD SUGAR NO MORE THAN TWICE A DAY   pantoprazole 40 MG tablet Commonly known as: PROTONIX Take 40 mg by mouth daily.   tamsulosin 0.4 MG Caps capsule Commonly known as: FLOMAX TAKE 1 CAPSULE DAILY AFTER SUPPER   zolpidem 10 MG tablet Commonly known as: AMBIEN Take 10 mg by mouth at bedtime.           Objective:   Physical Exam BP 126/72 (BP Location: Right Arm, Patient Position: Sitting, Cuff Size: Small)   Pulse 69   Temp 98.2 F (36.8 C) (Oral)   Resp 20   Ht 5\' 9"  (1.753 m)   Wt 190 lb 6 oz (86.4 kg)   SpO2 91%   BMI 28.11 kg/m  General:   Well developed, NAD, BMI noted..  Wearing oxygen HEENT:  Normocephalic . Face symmetric, atraumatic Lungs:  Decreased breath sounds at bases Normal respiratory effort, no intercostal retractions, no accessory muscle use. Heart: RRR,  no murmur.  Lower extremities: no pretibial edema bilaterally  Skin: Not pale. Not jaundice Neurologic:  alert & oriented X3.  Speech normal, gait appropriate for age and unassisted Psych--  Cognition and judgment appear intact.  Cooperative with normal attention span and concentration.  Behavior appropriate. No anxious or depressed appearing.      Assessment     Assessment  DM HTN Hyperlipidemia GERD Anxiety- insomnia   BPH Cardiovascular:Dr  --  Cath x 2 Ladona Ridgel: no significant CAD -- CHF, non-ischemic  --Atrial fibrillation; h/o AV node ablation, has a Pacemaker --h/o IC bleeding: Not anticoagulated --Peripheral vascular disease? Pulmonary: Dr [id number], last OV 10-18-2014, f/u prn --COPD, severe, chronic resp failure, night O2 prn, has portable O2  --Pleural effusion s/p decortication --Pulmonary hypertension  Stasis dermatitis   PLAN: TCM 14 Acute on chronic respiratory failure due to  COPD and/or  CHF exacerbation Was readmitted to the hospital, treated with IV Lasix, IV Solu-Medrol and Rocephin. He lost 5 L of fluid. Breathing improved. He is now back home and feeling very well. Was seen at the cardiology office, Farxiga added, BMP was satisfactory. We agreed on continue same medications, low-salt diet, monitor BPs, call cardiology (or me) if swelling or weight gain noted. Of note this his weight today is 190 pounds similar to the last time I saw him at this office on 08/11/2021 Labs: CBC, ionized calcium and LFTs. Recheck chest x-ray RTC already scheduled in few months.    This visit occurred during the SARS-CoV-2 public health emergency.  Safety protocols were in place, including screening questions prior to the visit, additional usage of staff PPE, and extensive cleaning of exam room while observing appropriate contact time as indicated for disinfecting solutions.

## 2021-09-14 NOTE — Patient Instructions (Addendum)
Per our records you are due for your diabetic eye exam. Please contact your eye doctor to schedule an appointment. Please have them send copies of your office visit notes to [place]. Our fax number is [phone]. If you need a referral to an eye doctor please let us know.     GO TO THE LAB : Get the blood work

## 2021-09-15 ENCOUNTER — Other Ambulatory Visit: Payer: Self-pay

## 2021-09-15 ENCOUNTER — Ambulatory Visit (INDEPENDENT_AMBULATORY_CARE_PROVIDER_SITE_OTHER): Payer: Medicare Other | Admitting: Pharmacist

## 2021-09-15 ENCOUNTER — Ambulatory Visit (HOSPITAL_BASED_OUTPATIENT_CLINIC_OR_DEPARTMENT_OTHER)
Admission: RE | Admit: 2021-09-15 | Discharge: 2021-09-15 | Disposition: A | Discharge status: Home / Self Care | Payer: Medicare Other | Source: Ambulatory Visit | Attending: Internal Medicine | Admitting: Internal Medicine

## 2021-09-15 DIAGNOSIS — J441 Chronic obstructive pulmonary disease with (acute) exacerbation: Secondary | ICD-10-CM | POA: Diagnosis present

## 2021-09-15 DIAGNOSIS — E785 Hyperlipidemia, unspecified: Secondary | ICD-10-CM

## 2021-09-15 DIAGNOSIS — E119 Type 2 diabetes mellitus without complications: Secondary | ICD-10-CM

## 2021-09-15 DIAGNOSIS — I5032 Chronic diastolic (congestive) heart failure: Secondary | ICD-10-CM

## 2021-09-15 DIAGNOSIS — J9611 Chronic respiratory failure with hypoxia: Secondary | ICD-10-CM

## 2021-09-15 DIAGNOSIS — I10 Essential (primary) hypertension: Secondary | ICD-10-CM

## 2021-09-15 DIAGNOSIS — E118 Type 2 diabetes mellitus with unspecified complications: Secondary | ICD-10-CM

## 2021-09-15 LAB — HEPATIC FUNCTION PANEL
ALT: 14 U/L (ref 0–53)
AST: 14 U/L (ref 0–37)
Albumin: 3.5 g/dL (ref 3.5–5.2)
Alkaline Phosphatase: 65 U/L (ref 39–117)
Bilirubin, Direct: 0.1 mg/dL (ref 0.0–0.3)
Total Bilirubin: 0.5 mg/dL (ref 0.2–1.2)
Total Protein: 5.9 g/dL — ABNORMAL LOW (ref 6.0–8.3)

## 2021-09-15 LAB — CBC WITH DIFFERENTIAL/PLATELET
Basophils Absolute: 0.1 10*3/uL (ref 0.0–0.1)
Basophils Relative: 0.8 % (ref 0.0–3.0)
Eosinophils Absolute: 0.1 10*3/uL (ref 0.0–0.7)
Eosinophils Relative: 0.7 % (ref 0.0–5.0)
HCT: 36.8 % — ABNORMAL LOW (ref 39.0–52.0)
Hemoglobin: 12.3 g/dL — ABNORMAL LOW (ref 13.0–17.0)
Lymphocytes Relative: 23.1 % (ref 12.0–46.0)
Lymphs Abs: 1.7 10*3/uL (ref 0.7–4.0)
MCHC: 33.3 g/dL (ref 30.0–36.0)
MCV: 93 fl (ref 78.0–100.0)
Monocytes Absolute: 0.8 10*3/uL (ref 0.1–1.0)
Monocytes Relative: 10.6 % (ref 3.0–12.0)
Neutro Abs: 4.7 10*3/uL (ref 1.4–7.7)
Neutrophils Relative %: 64.8 % (ref 43.0–77.0)
Platelets: 172 10*3/uL (ref 150.0–400.0)
RBC: 3.96 Mil/uL — ABNORMAL LOW (ref 4.22–5.81)
RDW: 14.8 % (ref 11.5–15.5)
WBC: 7.3 10*3/uL (ref 4.0–10.5)

## 2021-09-15 LAB — CALCIUM, IONIZED: Calcium, Ion: 4.85 mg/dL (ref 4.8–5.6)

## 2021-09-15 NOTE — Assessment & Plan Note (Signed)
TCM 14 Acute on chronic respiratory failure due to  COPD and/or  CHF exacerbation Was readmitted to the hospital, treated with IV Lasix, IV Solu-Medrol and Rocephin. He lost 5 L of fluid. Breathing improved. He is now back home and feeling very well. Was seen at the cardiology office, Farxiga added, BMP was satisfactory. We agreed on continue same medications, low-salt diet, monitor BPs, call cardiology (or me) if swelling or weight gain noted. Of note this his weight today is 190 pounds similar to the last time I saw him at this office on 08/11/2021 Labs: CBC, ionized calcium and LFTs. Recheck chest x-ray RTC already scheduled in few months.

## 2021-09-15 NOTE — Patient Instructions (Signed)
Bradley [name] It was a pleasure speaking with you today.  I am consulting with Dr Drue Novel and Dr Duke Salvia regarding your weight and if there is need to continue Januvia. I will be back in touch soon. I have attached a summary of our visit today and information about your health goals.   Our next appointment is by telephone on October 16, 2021 at 8:45am  Please call the care guide team at [phone] if you need to cancel or reschedule your appointment.    If you have any questions or concerns, please feel free to contact me either at the phone number below or with a MyChart message.    Bradley [name], PharmD Clinical Pharmacist [facility] [phone] (direct line)  [phone] (main office number)  CARE PLAN ENTRY Updated 09/15/2021  Patient self care activities - Over the next 30 days, patient will: take medications as prescribed check blood pressure daily, document, and provide at future appointments Check weight daily and record for future appointments.  Report signs and symptoms of CHF exacerbation - weight gain, shortness of breath, abdominal fullness, swelling in legs or abdomen, Fatigue and weakness, changes in ability to perform usual activities, persistent cough or wheezing with white or pink blood-tinged mucus, nausea and lack of appetite Continue to weigh daily - report weight gain of more than 3 lbs in 24 hours or 5 lbs in 1 week. target a minimum of 150 minutes of moderate intensity exercise weekly   Hypertension / Heart Failure BP Readings from Last 3 Encounters:  09/14/21 126/72  09/07/21 130/80  09/02/21 132/87   Pharmacist Clinical Goal(s): Over the next 120 days, patient will work with PharmD and providers to maintain BP goal <130/80 Current regimen:  Amlodipine 10mg  daily  Losartan 50 take 1 tablet daily   Carvedilol 25mg  twice daily Farxiga 10mg  daily (beneficial for heart and blood glucose /  diabetes) Interventions: Recommended he continue to check his blood pressure 2 to 3 time per week Patient self care activities - Over the next 120 days, patient will: Check blood pressure 2 to 3 times per week, document, and provide at future appointments Ensure daily salt intake < 2300 mg/day Continue current medications to lower blood pressure and heart  Check weight daily and record for future appointments.  Report signs and symptoms of CHF exacerbation - weight gain, shortness of breath, abdominal fullness, swelling in legs or abdomen, Fatigue and weakness, changes in ability to perform usual activities, persistent cough or wheezing with white or pink blood-tinged mucus, nausea and lack of appetite Continue to weigh daily and record for future appointments  Hyperlipidemia/PAD Lab Results  Component Value Date/Time   LDLCALC 70 12/09/2020 01:27 PM   Pharmacist Clinical Goal(s): Over the next 120 days, patient will work with PharmD and providers to maintain LDL goal < 70 Current regimen:  Atorvastatin 20mg  daily Aspirin 81mg  daily Nitroglycerin 0.4mg  as needed Patient self care activities - Over the next 120 days, patient will: Maintain cholesterol medication regimen. Be physically active - goal is to get at least 150 minutes of physical activity per week   Diabetes Lab Results  Component Value Date/Time   HGBA1C 5.9 (H) 07/30/2021 04:53 AM   HGBA1C 5.9 (H) 03/05/2021 05:29 AM   Pharmacist Clinical Goal(s): Over the next 120 days, patient will work with PharmD and providers to maintain A1c goal <7% Current regimen:  Januvia 100mg  daily 02/08/2021 10mg  daily Interventions: Discussed a1c goal  Checking with Dr  about possibly stopping Januvia and just taking [place] for blood glucose and heart.  Patient self care activities - Over the next 90 days, patient will: Check blood sugar 2 to 3 times per week, document, and provide at future appointments Reviewed home blood glucose  readings and reviewed goals  Fasting blood glucose goal (before meals) = 80 to 130 Blood glucose goal after a meal = less than 180  Contact provider with any episodes of hypoglycemia  /blood glucose < 80  GERD / acid reflux Pharmacist Clinical Goal(s) Over the next 120 days, patient will work with PharmD and providers to reduce symptoms of GERD and reduce polypharmacy Current regimen:  Pantoprazole 40mg  daily Interventions: Discussed the risk/benefit of continuation vs discontinuation of long term pantoprazole use  Patient self care activities - Over the next 120 days, patient will: Maintain current regimen for GERD / acid reflux  Medication management Pharmacist Clinical Goal(s): Over the next 30 days, patient will work with PharmD and providers to maintain optimal medication adherence Current pharmacy: [facility] Interventions Comprehensive medication review performed. Continue current medication management strategy  Follow Up Plan: The care management team will reach out to the patient again over the next 30-45 days. Patient will have phone visit with care management nurse 09/21/2021.  Patient verbalizes understanding of instructions provided today and agrees to view in [place].

## 2021-09-15 NOTE — Chronic Care Management (AMB) (Signed)
/   Chronic Care Management Pharmacy Note  09/15/2021 Name:  Bradley HORRIGAN Sr. MRN:  [id number] DOB:  16-Jun-1941  Summary:  Patient has had 3 CHF / COPD exacerbations in the last 7 months requiring hospitalization. Cardiology started [place] for HFpEF 09/08/2021.   He is also taking /Januvia 100mg  daily for type 2 DM. Last A1c was 5.9%. Reviewed patient's recent weights at home (see below) weight has been variable and increased the last 2 days. He denies shortness of breath at rest or at night, denies LEE, denies nausea or abdominal fullness and other s/s of CHF. He has not notified PCP or cardio about weight gain. He was to have chest xray yesterday but forgot to get before leaving [facility]. He is planning to get today.   Plan:  Will forward recent weights to cardio and PCP. Patient has f/u with Cardio / Dr Oval Linsey in 2 days 09/17/21.  Consider stopping Januvia since last A1c was great and [name] should help keep blood glucose down as well as help with CHF.   Subjective: Bradley Jock Sr. is an 80 y.o. year old male who is a primary patient of Paz, Alda Berthold, MD.  The CCM team was consulted for assistance with disease management and care coordination needs.    Engaged with patient by telephone for follow up visit in response to provider referral for pharmacy case management and/or care coordination services.   Consent to Services:  The patient was given information about Chronic Care Management services, agreed to services, and gave verbal consent prior to initiation of services.  Please see initial visit note for detailed documentation.   Patient Care Team: Colon Branch, MD as PCP - General (Internal Medicine) Skeet Latch, MD as PCP - Cardiology (Cardiology) Evans Lance, MD as Consulting Physician (Cardiology) Tanda Rockers, MD as Consulting Physician (Pulmonary Disease) Ruby Cola, MD as Consulting Physician (Otolaryngology) [place], Benjaman Pott, [place] (Optometry) Cherre Robins,  RPH-CPP (Pharmacist) Luretha Rued, RN as Case Manager  Recent office visits: 09/14/2021 - Int Med (Dr [facility] follow up. No med changes noted. Labs ordered ionized calcium, LFTs and CBC. Ordered chest xray. 08/11/2021 - Int Med (Dr Larose Kells) [facility] follow up. No medication changes noted.  04/10/2021 - PCP (Dr Larose Kells) recheck type 2 DM and COPD. No med changes. Noted patient has completed prednisone prescribed at discharge 03/17/2021 - PCP (DR [facility]) hospital follow up. Recommended COVID booster. No med changes.   Recent consult visits: 09/07/2021 - Cardio Ninfa Meeker, NP) Seen for HFpEF. Started Farxiag 10mg  daily at breakfast.  06/03/21 Cardio (Dr Oval Linsey)- Seen for general follow up. EKG completed. Follow up in 6 months. 03/19/2021 - Pulmonology (Dr Melvyn Novas)  F/U COPD. No med changes.  02/17/2021 - Cardio (Dr Lovena Le) F/U a fib and LV dysfunction. H/O ablation. CHF - class 2. No med changes.   Hospital visits: 08/29/2021 to 09/02/2021 Hospitalized at [facility] for COPD/CHF  exacerbation Medications started at discharge: predinsone 40mg  daily for 2 days; doxycyline 100mg  twice a day for 2 days.  07/29/2021 to 08/03/2021 Hospitalized at [facility] for Acute on chronic respiratory familure. Treated with steroids for COPD exacerbations and IV diuretics for CHF.    Medications started at discharge: prednisone 50mg  daily with breakfast and loratidine 10mg  daily; doxycycline 100mg  twice a day; guiafenesin twice a day; losartan 50mg  take 0.5 tablet = 25mg  daily  Medications stopped at discharge: Tylenol PM, Medication changes at discharge: amlodipine dose lowered to 5mg  daily; Furosemide dose increased  to take 80mg  daily except on MWF take 40mg  in am and 20mg  in afternoon. 03/06/2021 to 03/11/2021 [facility] admission for COPD exacerbation. Prescribed prednisone 10mg  x 3 days.   Objective:  Lab Results  Component Value Date   CREATININE 0.83 09/07/2021   CREATININE 0.74 09/02/2021   CREATININE  0.71 09/01/2021    Lab Results  Component Value Date   HGBA1C 5.9 (H) 07/30/2021   Last diabetic Eye exam:  Lab Results  Component Value Date/Time   HMDIABEYEEXA No Retinopathy 05/06/2020 12:00 AM    Last diabetic Foot exam:  Lab Results  Component Value Date/Time   HMDIABFOOTEX Done 07/15/2015 12:00 AM        Component Value Date/Time   CHOL 127 12/09/2020 1327   TRIG 103.0 12/09/2020 1327   HDL 36.20 (L) 12/09/2020 1327   CHOLHDL 4 12/09/2020 1327   VLDL 20.6 12/09/2020 1327   LDLCALC 70 12/09/2020 1327    Hepatic Function Latest Ref Rng & Units 07/30/2021 07/29/2021 03/05/2021  Total Protein 6.5 - 8.1 g/dL 6.7 6.9 6.8  Albumin 3.5 - 5.0 g/dL 02/08/2021) 08/01/2021) 3.5  AST 15 - 41 U/L 18 26 18   ALT 0 - 44 U/L 14 13 13   Alk Phosphatase 38 - 126 U/L 89 90 82  Total Bilirubin 0.3 - 1.2 mg/dL 0.7 1.0 0.9  Bilirubin, Direct 0.0 - 0.3 mg/dL - - -    Lab Results  Component Value Date/Time   TSH 1.61 06/07/2018 02:13 PM   TSH 0.300 (L) 05/25/2018 04:46 PM   TSH 1.76 11/01/2017 11:08 AM    CBC Latest Ref Rng & Units 09/01/2021 08/31/2021 08/30/2021  WBC 4.0 - 10.5 K/uL 8.1 8.3 5.2  Hemoglobin 13.0 - 17.0 g/dL 11.9(L) 11.6(L) 12.4(L)  Hematocrit 39.0 - 52.0 % 34.9(L) 34.5(L) 37.4(L)  Platelets 150 - 400 K/uL 228 235 201    No results found for: VD25OH  Clinical ASCVD: Yes  The ASCVD Risk score (Arnett DK, et al., 2019) failed to calculate for the following reasons:   The 2019 ASCVD risk score is only valid for ages 54 to 41     Social History   Tobacco Use  Smoking Status Former   Packs/day: 2.50   Years: 25.00   Pack years: 62.50   Types: Cigarettes, Cigars   Quit date: 10/11/1980   Years since quitting: 40.9  Smokeless Tobacco Never   BP Readings from Last 3 Encounters:  09/14/21 126/72  09/07/21 130/80  09/02/21 132/87   Pulse Readings from Last 3 Encounters:  09/14/21 69  09/07/21 77  09/02/21 73   Wt Readings from Last 3 Encounters:  09/14/21 190 lb  6 oz (86.4 kg)  09/07/21 185 lb 12.8 oz (84.3 kg)  09/02/21 183 lb 1.6 oz (83.1 kg)    Assessment: Review of patient past medical history, allergies, medications, health status, including review of consultants reports, laboratory and other test data, was performed as part of comprehensive evaluation and provision of chronic care management services.   SDOH:  (Social Determinants of Health) assessments and interventions performed:  SDOH Interventions    Flowsheet Row Most Recent Value  SDOH Interventions   Financial Strain Interventions Intervention Not Indicated       CCM Care Plan  Allergies  Allergen Reactions   Hydrocodone Other (See Comments)    "given to him in the hospital; went thru withdrawals once home; dr said not to take it again" (09/27/2012)   Tramadol Other (See Comments)    "  given to him in the hospital; went thru withdrawals once home; dr said not to take it again" (09/27/2012)   Tadalafil Other (See Comments)    headache, backache   Avelox [Moxifloxacin Hydrochloride] Itching and Other (See Comments)    Headache    Medications Reviewed Today     Reviewed by Cherre Robins, RPH-CPP (Pharmacist) on 09/15/21 at 39  Med List Status: <None>   Medication Order Taking? Sig Documenting Provider Last Dose Status Informant  acetaminophen (TYLENOL) 500 MG tablet [id number] Yes Take 500 mg by mouth every 6 (six) hours as needed for headache or moderate pain. [provider] Taking Active Self  albuterol (VENTOLIN HFA) 108 (90 Base) MCG/ACT inhaler [id number] No Inhale 2 puffs into the lungs every 6 (six) hours as needed for wheezing or shortness of breath.  Patient not taking: Reported on 09/15/2021   Jonetta Osgood, MD Not Taking Active Self  amLODipine (NORVASC) 10 MG tablet [id number] Yes Take 10 mg by mouth daily. [provider] Taking Active   aspirin 81 MG tablet [id number] Yes Take 81 mg by mouth every morning.  [provider] Taking  Active Self  atorvastatin (LIPITOR) 20 MG tablet [id number] Yes TAKE 1 TABLET AT BEDTIME Colon Branch, MD Taking Active Self  budesonide (PULMICORT) 0.5 MG/2ML nebulizer solution [id number] Yes Take 2 mLs (0.5 mg total) by nebulization 2 (two) times daily. Colon Branch, MD Taking Active Self  calcium-vitamin D (OSCAL WITH D) 500-200 MG-UNIT tablet [id number] Yes Take 1 tablet by mouth every morning. [provider] Taking Active Self  carvedilol (COREG) 25 MG tablet [id number] Yes TAKE 1 TABLET TWICE A DAY WITH MEALS Paz, Alda Berthold, MD Taking Active Self  cholecalciferol (VITAMIN D3) 25 MCG (1000 UNIT) tablet [id number] Yes Take 1,000 Units by mouth daily. [provider] Taking Active Self  dapagliflozin propanediol (FARXIGA) 10 MG TABS tablet [id number] Yes Take 1 tablet (10 mg total) by mouth daily before breakfast. Clegg, Amy D, NP Taking Active   diphenhydramine-acetaminophen (TYLENOL PM) 25-500 MG TABS tablet [id number] Yes Take 1 tablet by mouth at bedtime as needed. [provider] Taking Active Self           Med Note Lucila Maine, [place] Sep 07, 2021 12:08 PM)    furosemide (LASIX) 40 MG tablet [id number] Yes TAKE 2 TABLETS DAILY EXCEPT ON MONDAY, WEDNESDAY, AND FRIDAY TAKE 2 TABLETS IN THE MORNING AND 1 TABLET IN THE AFTERNOON Colon Branch, MD Taking Active Self  ipratropium-albuterol (DUONEB) 0.5-2.5 (3) MG/3ML SOLN [id number] Yes Take 3 mLs by nebulization 2 (two) times daily as needed. Colon Branch, MD Taking Active Self  Lancets [facility] ULTRASOFT) lancets [id number] Yes Check blood sugars no more than twice daily Colon Branch, MD Taking Active Self  loratadine (CLARITIN) 10 MG tablet [id number] Yes Take 1 tablet (10 mg total) by mouth daily. Geradine Girt, DO Taking Active Self  losartan (COZAAR) 50 MG tablet [id number] Yes Take 0.5 tablets (25 mg total) by mouth daily. Geradine Girt, DO Taking Active Self  Multiple Vitamins-Minerals (MULTIVITAMINS THER. W/MINERALS) TABS  tablet [id number] Yes Take 1 tablet by mouth daily. Colon Branch, MD Taking Active Self  nitroGLYCERIN (NITROSTAT) 0.4 MG SL tablet [id number] Yes Place 1 tablet (0.4 mg total) under the tongue every 5 (five) minutes x 3 doses as needed for chest pain. Colon Branch, MD Taking Active  Med Note Berneta Levins Sep 14, 2021  2:33 PM) PRN  Roma Schanz test strip [id number] Yes USE TO CHECK BLOOD SUGAR NO MORE THAN TWICE A DAY Paz, Alda Berthold, MD Taking Active Self  pantoprazole (PROTONIX) 40 MG tablet [id number] Yes Take 40 mg by mouth daily. [provider] Taking Active Self  sitaGLIPtin (JANUVIA) 100 MG tablet [id number] Yes TAKE 1 TABLET DAILY Colon Branch, MD Taking Active Self  tamsulosin (FLOMAX) 0.4 MG CAPS capsule [id number] Yes TAKE 1 CAPSULE DAILY AFTER SUPPER Colon Branch, MD Taking Active Self  zolpidem (AMBIEN) 10 MG tablet [id number] No Take 10 mg by mouth at bedtime.  Patient not taking: Reported on 09/15/2021   [provider] Not Taking Active Self            Patient Active Problem List   Diagnosis Date Noted   GERD without esophagitis 07/30/2021   Cor pulmonale (chronic) ([place]) 08/05/2020   COPD exacerbation ([place]) 12/22/2018   COPD GOLD 0 spirometry/ AB  12/22/2018   PCP NOTES >>> 07/15/2015   Annual physical exam 05/08/2012   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   Sinoatrial node dysfunction ([place])    Non-ischemic cardiomyopathy ([place]) 09/06/2011   Acute on chronic diastolic CHF (congestive heart failure) ([place])    PAD (peripheral artery disease) ([place]) 10/22/2010   DERMATITIS, STASIS 09/14/2010   Chronic respiratory failure with hypoxia and hypercapnia ([place]) 09/14/2010   Chronic pulmonary hypertension secondary to elevated L H pressures  04/15/2009   ERECTILE DYSFUNCTION 01/06/2009   Mixed diabetic hyperlipidemia associated with type 2 diabetes mellitus ([place]) 12/13/2007   Essential hypertension 12/13/2007   Anxiety--insomnia 08/14/2007   Type 2  diabetes mellitus without complication, without long-term current use of insulin ([place]) 01/02/2007   Chronic atrial fibrillation ([place]) 01/02/2007   Benign enlargement of prostate 01/02/2007    Immunization History  Administered Date(s) Administered   Fluad Quad(high Dose 65+) 06/26/2019, 08/11/2020   Influenza Split 10/01/2011   Influenza Whole 08/14/2007, 07/11/2009, 09/14/2010   Influenza, High Dose Seasonal PF 09/14/2013, 07/15/2015, 07/09/2016, 07/05/2017, 08/03/2018   Influenza,inj,Quad PF,6+ Mos 07/05/2014   Influenza-Unspecified 08/11/2012, 08/11/2021   Janssen (J&J) SARS-COV-2 Vaccination 01/12/2020, 09/19/2020   PFIZER Comirnaty([place])Covid-19 Tri-Sucrose Vaccine 03/18/2021   Pfizer Covid-19 Vaccine Bivalent Booster 27yrs & up 08/11/2021   Pneumococcal Conjugate-13 03/12/2015   Pneumococcal Polysaccharide-23 07/11/2004, 03/30/2009, 11/01/2017, 05/11/2019   Td 03/12/2003   Tetanus 09/14/2013   Zoster, Live 04/20/2011    Conditions to be addressed/monitored: Atrial Fibrillation, CHF, HTN, HLD, COPD, DMII, and enlarged prostate, PVD, GERD  Care Plan : General Pharmacy (Adult)  Updates made by Cherre Robins, RPH-CPP since 09/15/2021 12:00 AM     Problem: Hypertension, Hyperlipidemia/PAD/Cardiomyopathy, Diabetes, Afib, COPD   Priority: High  Onset Date: 12/15/2020     Goal: Provide education, support and care coordination for medication therapy and chronic conditions   Start Date: 12/15/2020  Expected End Date: 06/17/2021  Recent Progress: On track  Priority: High  Note:   Current Barriers:  Education needed regarding CHF and prevention of exacerbations Education for new [employer]  Pharmacist Clinical Goal(s):  Over the next 120 days, patient will achieve adherence to monitoring guidelines and medication adherence to achieve therapeutic efficacy maintain control of blood pressure and blood sugar as evidenced by home monitoring  adhere to prescribed medication  regimen as evidenced by fill dates through collaboration with PharmD and provider.   Interventions: 1:1 collaboration with Colon Branch, MD regarding development and update  of comprehensive plan of care as evidenced by provider attestation and co-signature Inter-disciplinary care team collaboration (see longitudinal plan of care) Comprehensive medication review performed; medication list updated in electronic medical record  Hypertension / HFpEF (BP goal <130/80 and prevent CHF exacerbations and hospitalizations) Blood pressure at goal; has had 3 CHF / COPD exacerbations in the last 7 months.  Current treatment: Amlodipine 10mg  daily  Losartan 50 mg daily Carvedilol 25mg  twice daily Farxiga 10mg  daily (Added 09/08/2021) Medications previously tried: none noted Current blood pressure home readings:  only 1 reading provided 132/78 Home weights:  Date Day of Week Daily furosemide dose Weight  11/28 Mon 120mg  185 lbs  11/29 Tue 80 177.8  11/30 Wed 120 177.4  (started [place])  12/1 Thurs 80 177.8  12/2 Fri 120 170.6  12/3 Sat 80 161.8  12/4 Sun 80 172.2  12/5 Mon 120 186.4  12/6 Tues 80 186.4   Denies hypotensive/hypertensive symptoms Patient denies lower extremity edema, Denies shortness of breath at rest or at night when lying down, denies nausea or abdominal fullness Interventions:  Discussed signs and symptoms of CHF exacerbation - weight gain, SOB, abdominal fullness, swelling in legs or abdomen, Fatigue and weakness, changes in ability to perform usual activities, persistent cough or wheezing with white or pink blood-tinged mucus, nausea and lack of appetite Continue to weigh daily - report weight gain of more than 3 lbs in 24 hours or 5 lbs in 1 week. Discussed the importance of letting either PCP or cardiologist know about weight changes to avoid exacerbations and hospitalizaiton Educated on BP goals and benefits of medications for prevention of heart attack, stroke and kidney  damage; Counseled to monitor BP at home 1 or 2 times per week, document, and provide log at future appointments Recommended to continue current medications for blood pressure and HFpEF  Hyperlipidemia: (LDL goal < 70) / PAD Controlled Current treatment: Atorvastatin 20mg  daily Aspirin 81mg  daily Nitroglucerin 0.4mg  as needed (last needed over 1 year ago) Medications previously tried: none noted  Interventions:  Reviewed most recent lipid panel, LDL now at goal.   Reviewed cholesterol goals;  Benefits of statin for ASCVD risk reduction  Recommended to continue current medication for cholesterol lowering  Diabetes (A1c goal <7%) Controlled; last A1c was 5.9% Current medications: Januvia 100mg  daily Farxiga 10mg  daily  Patient endorses that [place] and [employer] are affordable - has Medicare and insurance with previous employer Rx coverage.  90 day supply = $11. Medications previously tried: metformin (heart failure?), glyburide (D/C to deprescribe per low a1c) Current home glucose readings Ranges from 100 to 145 Denies BG <80 Denies hypoglycemic/hyperglycemic symptoms Current meal patterns: states his wife and granddaughter make sure he "eats right"  His granddaughter that lives with them prepares all meals.  Interventions:  Educated on A1c and blood sugar goals Since Farxiga started for CHF, will consult with PCP about stopping Januvia since last A1c was 5.9%.  Reviewed home blood glucose readings and reviewed goals  Fasting blood glucose goal (before meals) = 80 to 130 Blood glucose goal after a meal = less than 180  Counseled to check feet daily and get yearly eye exams Recommended to continue current medication  Atrial Fibrillation (Goal: prevent stroke and major bleeding) Controlled Current treatment: Rate control: Carvedilol 25mg  twice daily Anticoagulation: Aspirin 81mg  daily Medications previously tried: coumadin (intracranial bleed in 2013) Interventions:  Reviewed  signs and symptoms for bleeding to monitor for regarding aspirin Recommended to continue current medication and follow  up with cardiologist.   COPD (Goal: control symptoms and prevent exacerbations) Controlled Managed by pulmonology - Dr Melvyn Novas (last appt 02/2021) Current treatment  Pulmicort (Budesonide) 0.5mg /56mL nebulized twice daily DuoNeb (Ipratropium/Albuterol) 0.5/2.5/43mL nebulized twice daily as needed Albuterol HFA - inhale 1 or 2 puff every 4 to 6 hours as needed for shortness of breath or wheezing Supplement oxygen - 2 to 3 L to keep O2 sats >90% Medications previously tried: none noted  Gold Grade: Gold 0 per DR Wert's last note 03/2021 Current COPD Classification:  C (low sx, >2 exacerbations/yr) Has had 3 Hospitalizations in last year for COPD / CHF Interventions:  Reviewed medication regimen for COPD Reviewed O2 sat measurement; continue to check daily Recommended to continue current medication Patient forgot to get chest xray 09/14/2021. Recommended he get ASAP - he plans to come in today for chest xray.  Patient Goals/Self-Care Activities Over the next 120 days, patient will:  take medications as prescribed check blood pressure daily, document, and provide at future appointments Check weight daily and record for future appointments.  Report signs and symptoms of CHF exacerbation - weight gain, shortness of breath, abdominal fullness, swelling in legs or abdomen, Fatigue and weakness, changes in ability to perform usual activities, persistent cough or wheezing with white or pink blood-tinged mucus, nausea and lack of appetite Continue to weigh daily - report weight gain of more than 3 lbs in 24 hours or 5 lbs in 1 week. target a minimum of 150 minutes of moderate intensity exercise weekly  Follow Up Plan: The care management team will reach out to the patient again over the next 30-45 days. Patient will have phone visit with care management nurse 09/21/2021.       Medication Assistance: None required.  Patient affirms current coverage meets needs.  Patient's preferred pharmacy is:  [facility] [place] [address] [place] [address] Phone: [phone] Fax: [id number] - [place], [place] - [address] AT [place] [address] [place] [address] Phone: [phone] Fax: [phone]  Follow Up:  Patient agrees to Care Plan and Follow-up.  Plan: Telephone follow up appointment with care management team member scheduled for:  09/21/2021 with care management nurse; 10/16/2021 with clinical pharmacist  Cherre Robins, PharmD Clinical Pharmacist [place] [facility]

## 2021-09-16 NOTE — Progress Notes (Signed)
Cardiology Office Note  Date:  09/17/2021   ID:  Bradley Searles Sr., DOB 01/24/1941, MRN [id number]  PCP:  [name], MD  Cardiologist:   [name], MD   No chief complaint on file.   History of Present Illness: Bradley DEBAERE Sr. is a 80 y.o. male with chronic atrial fibrillation s/p AVN ablation and CRT-P, chronic diastolic heart failure, mild aortic stenosis, CAD, hypertension, hyperlipidemia, prior ICH (no anticoagulation), COPD, and PAD who presents for follow-up.  He previously underwent left heart catheterization in 2005 and in 2018 and had no significant coronary artery disease. He had a BiV pacemaker and AV nodal ablation 09/08/11. At that time he had reduced systolic function in the setting of atrial fibrillation with RVR. After placement of the biventricular pacemaker his ejection fraction improved to 50-55% in 2017.  He was admitted 02/2017 with COPD exacerbation and acute on chronic diastolic heart failure. BNP was 491 and TEE had pleural effusions on chest x-ray. He improved with diuresis. Echocardiogram at that time revealed LVEF 55-60%, mild mitral regurgitation, and PA SP 55 mmHg.  He followed up with [name] on 03/09/17 and was doing well.  Bradley [name] was admitted 12/2018 with a COPD exacerbation  He developed increasing shortness of breath and cough.  In the hospital he required BiPAP, IV steroids and antibiotics.  He was again admitted 04/2019 with acute respiratory failure due to both COPD exacerbation and acute on chronic diastolic heart failure.  He followed up with [name], PA-C on 05/2019 and his weight was stable at 206 pounds.    Bradley [name] was admitted 09/2019 with a COPD exacerbation requiring antibiotics and steroids.  He had an echo 08/2020 that revealed LVEF 55 to 60% with mild LVH.  Right ventricular function was normal.  PASP was 50.6 mmHg.  He was then admitted 03/2021 with COPD exacerbation and acute on chronic diastolic heart failure.  Since discharge he had  been feeling well.    On 07/29/2021 he was admitted with acute on chronic respiratory failure with hypoxia, acute on chronic systolic CHF, and COPD with acute exacerbation. CXR showed evidence of patchy bilateral infiltrates possibly indicating pulmonary edema. His weight had increased approximately 12 kg over the month prior. He was again admitted 08/29/2021 with acute on chronic hypoxic respiratory failure. While in the hospital he was seen by Heart Failure. He was treated with steroids and IV Lasix. He followed up with Heart Failure on 11/22 and was doing well. Bradley [name] was added to his regimen.   Overall he is feeling excellent today. Lately his main complaint is his fluctuating weight at home, 183 this morning and 181 yesterday. The day before his weight was in the 160s. He does produce urine after taking Lasix. Every day he takes 2 tablets of 40 mg Lasix in the AM, and 1 in the afternoon on MWF. His breathing has improved, and he denies orthopnea. The last time he went to the ED he felt his dyspnea had occurred suddenly. He remains tolerant of Bradley [name] and believes it is working well. He denies any palpitations, or chest pain. No lightheadedness, headaches, syncope, orthopnea, PND, or exertional symptoms.   Past Medical History:  Diagnosis Date   Abnormal CT scan, chest 2012   CT chest, several lymphadenopathies. Sees pulmonary   Anemia    intermittent   Arthritis    "?back" (09/19/2018)   Atrial fibrillation (HCC)    s/p AV node ablation & BiV PPM  implantation 09/08/11 (op dictation pending)   BPH (benign prostatic hyperplasia)    Saw Dr Reece Agar 2004, normal renal u/s   Bronchitis 08/26/2017   CAD (coronary artery disease)    CHF (congestive heart failure) ([place])    Thought primarily to be non-systolic although EF down (EF 45-50% 12/2010, down to 35-40% 09/05/11), cath 2008 with no CAD, nuclear study 07/2011 showing Small area of reversibility in the distal ant/lat wall the left ventricle  suspicious for ischemia/septal wall HK but felt to be low risk  (per D/C Summary 07/2011)   Chronic bronchitis ([place])    "get it ~ q yr"   Chronic respiratory failure with hypoxia and hypercapnia ([place]) 09/14/2010   Followed in Pulmonary clinic/ [facility]/ [place]  Started on 02 2lpm at discharge 09/23/11   - PFT's 10/28/2011  FEV1  1.40 (51%)  with ratio 70 and no better p B2 and DLCO 53 corrects to 101    - PFT's 10/18/2014    FEV1 1.71 (59%) with ratio 68 and no sign change p B2 and DLCO 53 corrects to 85  -  HC03   07/28/20  = 33  -  HCO3   03/17/21     = 31     Heart murmur    HLD (hyperlipidemia)    HTN (hypertension)    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   Pleural effusion 2008   S/p decortication   Pneumonia 08/02/2011   Pulmonary HTN ([place])    per cath 2008   Type II diabetes mellitus ([place]) 1999    Past Surgical History:  Procedure Laterality Date   BI-VENTRICULAR PACEMAKER INSERTION Left 09/08/2011   Procedure: BI-VENTRICULAR PACEMAKER INSERTION (CRT-P);  Surgeon: Evans Lance, MD;  Location: [facility] CATH LAB;  Service: Cardiovascular;  Laterality: Left;   CARDIAC CATHETERIZATION     "couple times; never had balloon or stent" (09/19/2018)   CATARACT EXTRACTION W/ INTRAOCULAR LENS  IMPLANT, BILATERAL Bilateral 08/2018   COLONOSCOPY  03/10/11   normal   INSERT / REPLACE / REMOVE PACEMAKER  09/08/11   pacemaker placement   LUNG DECORTICATION     PLEURAL SCARIFICATION     pneumothorax with fibrothorax  ~ 2010   TONSILLECTOMY     "as a kid"      Current Outpatient Medications  Medication Sig Dispense Refill   acetaminophen (TYLENOL) 500 MG tablet Take 500 mg by mouth every 6 (six) hours as needed for headache or moderate pain.     albuterol (VENTOLIN HFA) 108 (90 Base) MCG/ACT inhaler Inhale  2 puffs into the lungs every 6 (six) hours as needed for wheezing or shortness of breath. 8.5 g 0   amLODipine (NORVASC) 10 MG tablet Take 10 mg by mouth daily.     aspirin 81 MG tablet Take 81 mg by mouth every morning.      atorvastatin (LIPITOR) 20 MG tablet TAKE 1 TABLET AT BEDTIME 90 tablet 1   budesonide (PULMICORT) 0.5 MG/2ML nebulizer solution Take 2 mLs (0.5 mg total) by nebulization 2 (two) times daily. 360 mL 1   calcium-vitamin D (OSCAL WITH D) 500-200 MG-UNIT tablet Take 1 tablet by mouth every morning.     carvedilol (COREG) 25 MG tablet TAKE 1 TABLET  TWICE A DAY WITH MEALS 180 tablet 1   cholecalciferol (VITAMIN D3) 25 MCG (1000 UNIT) tablet Take 1,000 Units by mouth daily.     dapagliflozin propanediol (FARXIGA) 10 MG TABS tablet Take 1 tablet (10 mg total) by mouth daily before breakfast. 90 tablet 3   diphenhydramine-acetaminophen (TYLENOL PM) 25-500 MG TABS tablet Take 1 tablet by mouth at bedtime as needed.     ipratropium-albuterol (DUONEB) 0.5-2.5 (3) MG/3ML SOLN Take 3 mLs by nebulization 2 (two) times daily as needed. 540 mL 1   Lancets (ONETOUCH ULTRASOFT) lancets Check blood sugars no more than twice daily 200 each 12   loratadine (CLARITIN) 10 MG tablet Take 1 tablet (10 mg total) by mouth daily.     losartan (COZAAR) 50 MG tablet Take 0.5 tablets (25 mg total) by mouth daily. 90 tablet 1   Multiple Vitamins-Minerals (MULTIVITAMINS THER. W/MINERALS) TABS tablet Take 1 tablet by mouth daily. 30 each 5   nitroGLYCERIN (NITROSTAT) 0.4 MG SL tablet Place 1 tablet (0.4 mg total) under the tongue every 5 (five) minutes x 3 doses as needed for chest pain. 25 tablet 3   ONETOUCH VERIO test strip USE TO CHECK BLOOD SUGAR NO MORE THAN TWICE A DAY 200 strip 12   pantoprazole (PROTONIX) 40 MG tablet Take 40 mg by mouth daily.     sitaGLIPtin (JANUVIA) 100 MG tablet TAKE 1 TABLET DAILY 90 tablet 1   tamsulosin (FLOMAX) 0.4 MG CAPS capsule TAKE 1 CAPSULE DAILY AFTER SUPPER 90 capsule  3   zolpidem (AMBIEN) 10 MG tablet Take 10 mg by mouth at bedtime.     furosemide (LASIX) 40 MG tablet TAKE 2 TABLETS EVERY MORNING AND 1 TABLET IN THE LATE AFTERNOON 270 tablet 1   No current facility-administered medications for this visit.    Allergies:   Hydrocodone, Tramadol, Tadalafil, and Avelox [moxifloxacin hydrochloride]    Social History:  The patient  reports that he quit smoking about 40 years ago. His smoking use included cigarettes and cigars. He has a 62.50 pack-year smoking history. He has never used smokeless tobacco. He reports that he does not currently use alcohol. He reports that he does not use drugs.   Family History:  The patient's family history includes Breast cancer in his maternal aunt; Coronary artery disease in his father; Diabetes in his father; Drug abuse in his son; Polycythemia in his mother.    ROS:   Please see the history of present illness. All other systems are reviewed and negative.    PHYSICAL EXAM: VS:  BP 112/74 (BP Location: Right Arm, Patient Position: Sitting, Cuff Size: Normal)   Pulse 87   Ht 5\' 9"  (1.753 m)   Wt 189 lb 12.8 oz (86.1 kg)   SpO2 96%   BMI 28.03 kg/m  , BMI Body mass index is 28.03 kg/m. GENERAL:  Well appearing HEENT: Pupils equal round and reactive, fundi not visualized, oral mucosa unremarkable.  Edentulous.   NECK:  No jugular venous distention, waveform within normal limits, carotid upstroke brisk and symmetric, no bruits LUNGS:  Diminished at the bases.  Poor air movement.  HEART:  Irregularly irregular.  PMI not displaced or sustained,S1 and S2 within normal limits, no S3, no S4, no clicks, no rubs, no murmurs ABD:  Flat, positive bowel sounds normal in frequency in pitch, no bruits, no rebound, no guarding, no midline pulsatile mass, no hepatomegaly, no splenomegaly EXT:  2 plus pulses throughout, Trace bilateral ankle edema, no cyanosis no  clubbing SKIN:  No rashes no nodules NEURO:  Cranial nerves II  through XII grossly intact, motor grossly intact throughout PSYCH:  Cognitively intact, oriented to person place and time   EKG:   09/17/2021: EKG was not ordered. 06/03/2021: Atrial fibrillation.  Ventricular paced 73 bpm. 11/28/20: VP.  Rate 82 bpm.   08/26/17: VP.  Rate 82 bpm.   05/26/17: atrial fibrillation.  Ventricular paced at 75 bpm  Echo 05/26/18: Study Conclusions   - Left ventricle: The cavity size was normal. Wall thickness was   normal. Systolic function was normal. The estimated ejection   fraction was in the range of 55% to 60%. Although no diagnostic   regional wall motion abnormality was identified, this possibility   cannot be completely excluded on the basis of this study. The   study was not technically sufficient to allow evaluation of LV   diastolic dysfunction due to atrial fibrillation. - Aortic valve: There was no stenosis. - Mitral valve: Moderately calcified annulus. There was trivial   regurgitation. - Left atrium: The atrium was mildly dilated. - Right ventricle: The cavity size was moderately dilated. Pacer   wire or catheter noted in right ventricle. Systolic function was   normal. - Right atrium: The atrium was severely dilated. - Tricuspid valve: Peak RV-RA gradient (S): 32 mm Hg. - Pulmonary arteries: PA peak pressure: 40 mm Hg (S). - Systemic veins: IVC measured 2.2 cm with > 50% respirophasic   variation, suggesting RA pressure 8 mmHg.   Impressions:   - The patient appeared to be in atrial fibrillation. Normal LV size   and systolic function, EF 0000000. Moderately dilated RV with   normal systolic function. Mild pulmonary hypertension. Biatrial   enlargement. No significant valvular abnormalities.  Recent Labs: 09/01/2021: B Natriuretic Peptide 372.9 09/02/2021: Magnesium 2.3 09/07/2021: BUN 15; Creatinine, Ser 0.83; Potassium 4.6; Sodium 138 09/14/2021: ALT 14; Hemoglobin 12.3; Platelets 172.0    Lipid Panel    Component Value  Date/Time   CHOL 127 12/09/2020 1327   TRIG 103.0 12/09/2020 1327   HDL 36.20 (L) 12/09/2020 1327   CHOLHDL 4 12/09/2020 1327   VLDL 20.6 12/09/2020 1327   LDLCALC 70 12/09/2020 1327    Wt Readings from Last 3 Encounters:  09/17/21 189 lb 12.8 oz (86.1 kg)  09/14/21 190 lb 6 oz (86.4 kg)  09/07/21 185 lb 12.8 oz (84.3 kg)     ASSESSMENT AND PLAN:  PAD (peripheral artery disease) ([place]) He has diminished peripheral pulses but no claudication.  Continue aspirin and atorvastatin.   Chronic atrial fibrillation (HCC) Rates well-controlled.  Continue carvedilol.  He is not on anticoagulation due to prior intracranial hemorrhage.  Acute on chronic diastolic CHF (congestive heart failure) ([place]) He has had multiple admissions for COPD exacerbation and diastolic heart failure.  He feels very well right now and is doing excellent after the addition of [place].  His weight is creeping up.  I am concerned because he does not get symptomatic until he is critically ill requiring hospitalization.  We will increase his Lasix to 80 mg in the morning and 40 mg in the evening every day.  Check a BMP and a BNP in a week.  Blood pressures well-controlled.  Essential hypertension Blood pressure well have been controlled on losartan, Lasix, carvedilol, and amlodipine.     Current medicines are reviewed at length with the patient today.  The patient does not have concerns regarding medicines.  The following changes have been made:  none  Labs/ tests ordered today include:   Orders Placed This Encounter  Procedures   Basic metabolic panel   B Nat Peptide     Disposition:   FU with Macen Joslin C. Duke Salvia, MD, [facility] with scheduled appointment in 11/2021.  I,Mathew Stumpf,acting as a Neurosurgeon for [name], MD.,have documented all relevant documentation on the behalf of [name], MD,as directed by  [name], MD while in the presence of [name], MD.  I, Kingsley Farace C. Duke Salvia,  MD have reviewed all documentation for this visit.  The documentation of the exam, diagnosis, procedures, and orders on 09/17/2021 are all accurate and complete.   Signed, Aubra Pappalardo C. Duke Salvia, MD, [facility]  09/17/2021 8:23 AM    [facility]

## 2021-09-17 ENCOUNTER — Telehealth: Payer: Self-pay | Admitting: Internal Medicine

## 2021-09-17 ENCOUNTER — Encounter (HOSPITAL_BASED_OUTPATIENT_CLINIC_OR_DEPARTMENT_OTHER): Payer: Self-pay | Admitting: Cardiovascular Disease

## 2021-09-17 ENCOUNTER — Other Ambulatory Visit: Payer: Self-pay

## 2021-09-17 ENCOUNTER — Ambulatory Visit (INDEPENDENT_AMBULATORY_CARE_PROVIDER_SITE_OTHER): Payer: Medicare Other | Admitting: Cardiovascular Disease

## 2021-09-17 VITALS — BP 112/74 | HR 87 | Ht 69.0 in | Wt 189.8 lb

## 2021-09-17 DIAGNOSIS — Z5181 Encounter for therapeutic drug level monitoring: Secondary | ICD-10-CM

## 2021-09-17 DIAGNOSIS — R0602 Shortness of breath: Secondary | ICD-10-CM | POA: Diagnosis not present

## 2021-09-17 DIAGNOSIS — I5033 Acute on chronic diastolic (congestive) heart failure: Secondary | ICD-10-CM

## 2021-09-17 DIAGNOSIS — I482 Chronic atrial fibrillation, unspecified: Secondary | ICD-10-CM

## 2021-09-17 DIAGNOSIS — I739 Peripheral vascular disease, unspecified: Secondary | ICD-10-CM | POA: Diagnosis not present

## 2021-09-17 DIAGNOSIS — I10 Essential (primary) hypertension: Secondary | ICD-10-CM

## 2021-09-17 MED ORDER — FUROSEMIDE 40 MG PO TABS: ORAL_TABLET | ORAL | 1 refills | Status: DC

## 2021-09-17 MED ORDER — PANTOPRAZOLE SODIUM 40 MG PO TBEC: 40.0000 mg | DELAYED_RELEASE_TABLET | Freq: Every day | ORAL | 1 refills | Status: DC

## 2021-09-17 NOTE — Telephone Encounter (Signed)
Medication: pantoprazole (PROTONIX) 40 MG tablet   Has the patient contacted their pharmacy? No. (If no, request that the patient contact the pharmacy for the refill.) (If yes, when and what did the pharmacy advise?)  Preferred Pharmacy (with phone number or street name): [facility] - [place], [place] - [address]  [address], [place] [address]  Phone:  [phone]  Fax:  [phone]   Agent: Please be advised that RX refills may take up to 3 business days. We ask that you follow-up with your pharmacy.

## 2021-09-17 NOTE — Telephone Encounter (Signed)
Rx sent 

## 2021-09-17 NOTE — Assessment & Plan Note (Signed)
Rates well-controlled.  Continue carvedilol.  He is not on anticoagulation due to prior intracranial hemorrhage.

## 2021-09-17 NOTE — Assessment & Plan Note (Signed)
He has had multiple admissions for COPD exacerbation and diastolic heart failure.  He feels very well right now and is doing excellent after the addition of [place].  His weight is creeping up.  I am concerned because he does not get symptomatic until he is critically ill requiring hospitalization.  We will increase his Lasix to 80 mg in the morning and 40 mg in the evening every day.  Check a BMP and a BNP in a week.  Blood pressures well-controlled.

## 2021-09-17 NOTE — Assessment & Plan Note (Signed)
He has diminished peripheral pulses but no claudication.  Continue aspirin and atorvastatin.

## 2021-09-17 NOTE — Patient Instructions (Addendum)
Medication Instructions:  INCREASE YOUR FUROSEMIDE TO 80 MG IN THE MORNING AND 40 MG IN THE LATE AFTERNOON  *If you need a refill on your cardiac medications before your next appointment, please call your pharmacy*  Lab Work: BMET/BNP IN 1 WEEK   If you have labs (blood work) drawn today and your tests are completely normal, you will receive your results only by: MyChart Message (if you have MyChart) OR A paper copy in the mail If you have any lab test that is abnormal or we need to change your treatment, we will call you to review the results.  Testing/Procedures: NONE  Follow-Up: At [name], you and your health needs are our priority.  As part of our continuing mission to provide you with exceptional heart care, we have created designated Provider Care Teams.  These Care Teams include your primary Cardiologist (physician) and Advanced Practice Providers (APPs -  Physician Assistants and Nurse Practitioners) who all work together to provide you with the care you need, when you need it.  We recommend signing up for the patient portal called "MyChart".  Sign up information is provided on this After Visit Summary.  MyChart is used to connect with patients for Virtual Visits (Telemedicine).  Patients are able to view lab/test results, encounter notes, upcoming appointments, etc.  Non-urgent messages can be sent to your provider as well.   To learn more about what you can do with MyChart, go to [email or web address].    Your next appointment:   AS SCHEDULED IN [place]

## 2021-09-17 NOTE — Assessment & Plan Note (Signed)
Blood pressure well have been controlled on losartan, Lasix, carvedilol, and amlodipine.

## 2021-09-21 ENCOUNTER — Ambulatory Visit: Payer: Medicare Other

## 2021-09-21 ENCOUNTER — Telehealth: Payer: Self-pay

## 2021-09-21 DIAGNOSIS — I5032 Chronic diastolic (congestive) heart failure: Secondary | ICD-10-CM

## 2021-09-21 DIAGNOSIS — J9611 Chronic respiratory failure with hypoxia: Secondary | ICD-10-CM

## 2021-09-21 DIAGNOSIS — J9612 Chronic respiratory failure with hypercapnia: Secondary | ICD-10-CM

## 2021-09-21 NOTE — Patient Instructions (Signed)
Visit Information  Thank you for taking time to visit with me today. Please don't hesitate to contact me if I can be of assistance to you before our next scheduled telephone appointment.  Following are the goals we discussed today:  (Copy and paste patient goals from clinical care plan here)  Our next appointment is by telephone on 10/20/21 at 10:00 am  Please call the care guide team at [phone] if you need to cancel or reschedule your appointment.   If you are experiencing a Mental Health or Behavioral Health Crisis or need someone to talk to, please call the Suicide and Crisis Lifeline: 988 call 1-800-273-TALK (toll free, 24 hour hotline)   Patient verbalizes understanding of instructions provided today and agrees to view in MyChart.   Kathyrn Sheriff, RN, MSN, BSN, CCM Care Management Coordinator [facility] [phone]

## 2021-09-21 NOTE — Telephone Encounter (Signed)
  Care Management   Follow Up Note   09/21/2021 Name: Bradley PETTER Sr. MRN: [id number] DOB: 16-Dec-1940   Referred by: [name], MD Reason for referral : Chronic Care Management (RNCM follow up)   An unsuccessful telephone outreach was attempted today. The patient was referred to the case management team for assistance with care management and care coordination.   Follow Up Plan: A HIPPA compliant phone message was left for the patient providing contact information and requesting a return call. The Care Management team will outreach within the next 30 days.  Kathyrn Sheriff, RN, MSN, BSN, CCM Care Management Coordinator [facility] [phone]

## 2021-09-21 NOTE — Chronic Care Management (AMB) (Addendum)
Chronic Care Management   CCM RN Visit Note  09/21/2021 Name: Bradley [name]. MRN: [id number] DOB: 09/03/1941  Subjective: Bradley [name]. is a 80 y.o. year old male who is a primary care patient of Bradley Branch, MD. The care management team was consulted for assistance with disease management and care coordination needs.    Engaged with patient by telephone for follow up visit in response to provider referral for case management and/or care coordination services.   Consent to Services:  The patient was given information about Chronic Care Management services, agreed to services, and gave verbal consent prior to initiation of services.  Please see initial visit note for detailed documentation.   Patient agreed to services and verbal consent obtained.   Assessment: Review of patient past medical history, allergies, medications, health status, including review of consultants reports, laboratory and other test data, was performed as part of comprehensive evaluation and provision of chronic care management services.   SDOH (Social Determinants of Health) assessments and interventions performed:    CCM Care Plan  Allergies  Allergen Reactions   Hydrocodone Other (See Comments)    "given to him in the hospital; went thru withdrawals once home; dr said not to take it again" (09/27/2012)   Tramadol Other (See Comments)    "given to him in the hospital; went thru withdrawals once home; dr said not to take it again" (09/27/2012)   Tadalafil Other (See Comments)    headache, backache   Avelox [Moxifloxacin Hydrochloride] Itching and Other (See Comments)    Headache    Outpatient Encounter Medications as of 09/21/2021  Medication Sig Note   acetaminophen (TYLENOL) 500 MG tablet Take 500 mg by mouth every 6 (six) hours as needed for headache or moderate pain.    albuterol (VENTOLIN HFA) 108 (90 Base) MCG/ACT inhaler Inhale 2 puffs into the lungs every 6 (six) hours as needed for wheezing  or shortness of breath.    amLODipine (NORVASC) 10 MG tablet Take 10 mg by mouth daily.    aspirin 81 MG tablet Take 81 mg by mouth every morning.     atorvastatin (LIPITOR) 20 MG tablet TAKE 1 TABLET AT BEDTIME    budesonide (PULMICORT) 0.5 MG/2ML nebulizer solution Take 2 mLs (0.5 mg total) by nebulization 2 (two) times daily.    calcium-vitamin D (OSCAL WITH D) 500-200 MG-UNIT tablet Take 1 tablet by mouth every morning.    carvedilol (COREG) 25 MG tablet TAKE 1 TABLET TWICE A DAY WITH MEALS    cholecalciferol (VITAMIN D3) 25 MCG (1000 UNIT) tablet Take 1,000 Units by mouth daily.    dapagliflozin propanediol (FARXIGA) 10 MG TABS tablet Take 1 tablet (10 mg total) by mouth daily before breakfast.    diphenhydramine-acetaminophen (TYLENOL PM) 25-500 MG TABS tablet Take 1 tablet by mouth at bedtime as needed.    furosemide (LASIX) 40 MG tablet TAKE 2 TABLETS EVERY MORNING AND 1 TABLET IN THE LATE AFTERNOON    ipratropium-albuterol (DUONEB) 0.5-2.5 (3) MG/3ML SOLN Take 3 mLs by nebulization 2 (two) times daily as needed.    loratadine (CLARITIN) 10 MG tablet Take 1 tablet (10 mg total) by mouth daily.    losartan (COZAAR) 50 MG tablet Take 0.5 tablets (25 mg total) by mouth daily.    Multiple Vitamins-Minerals (MULTIVITAMINS THER. W/MINERALS) TABS tablet Take 1 tablet by mouth daily.    nitroGLYCERIN (NITROSTAT) 0.4 MG SL tablet Place 1 tablet (0.4 mg total) under the tongue every 5 (  five) minutes x 3 doses as needed for chest pain. 09/14/2021: PRN   pantoprazole (PROTONIX) 40 MG tablet Take 1 tablet (40 mg total) by mouth daily before breakfast.    sitaGLIPtin (JANUVIA) 100 MG tablet TAKE 1 TABLET DAILY    tamsulosin (FLOMAX) 0.4 MG CAPS capsule TAKE 1 CAPSULE DAILY AFTER SUPPER    zolpidem (AMBIEN) 10 MG tablet Take 10 mg by mouth at bedtime.    Lancets (ONETOUCH ULTRASOFT) lancets Check blood sugars no more than twice daily    ONETOUCH VERIO test strip USE TO CHECK BLOOD SUGAR NO MORE THAN  TWICE A DAY    No facility-administered encounter medications on file as of 09/21/2021.    Patient Active Problem List   Diagnosis Date Noted   GERD without esophagitis 07/30/2021   Cor pulmonale (chronic) (HCC) 08/05/2020   COPD exacerbation (HCC) 12/22/2018   COPD GOLD 0 spirometry/ AB  12/22/2018   PCP NOTES >>> 07/15/2015   Annual physical exam 05/08/2012   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   Sinoatrial node dysfunction (HCC)    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   Acute on chronic diastolic CHF (congestive heart failure) (HCC)    PAD (peripheral artery disease) (HCC) 10/22/2010   DERMATITIS, STASIS 09/14/2010   Chronic respiratory failure with hypoxia and hypercapnia (HCC) 09/14/2010   Chronic pulmonary hypertension secondary to elevated L H pressures  04/15/2009   ERECTILE DYSFUNCTION 01/06/2009   Mixed diabetic hyperlipidemia associated with type 2 diabetes mellitus (HCC) 12/13/2007   Essential hypertension 12/13/2007   Anxiety--insomnia 08/14/2007   Type 2 diabetes mellitus without complication, without long-term current use of insulin (HCC) 01/02/2007   Chronic atrial fibrillation (HCC) 01/02/2007   Benign enlargement of prostate 01/02/2007    Conditions to be addressed/monitored:CHF, COPD, and DMII  Care Plan : RN Care Manager Plan of Care  Updates made by Bradley Maryland, RN since 09/21/2021 12:00 AM     Problem: No Plan of Care Established for Management of Chronic Disease (COPD, CHF)   Priority: High     Long-Range Goal: Development of Plan of Care for Chronic Disease (COPD, CHF)   Start Date: 08/17/2021  Expected End Date: 02/14/2022  Recent Progress: On track  Priority: High  Note:   Current Barriers: Bradley [name] reports he is "doing good". Denies any signs/symptoms of CHF or COPD exacerbation. He has followed up with provider visits as scheduled. Continues to use O2 at 3L/[place].   Chronic Disease Management support and education needs related to CHF and  COPD  RNCM Clinical Goal(s):  Patient will verbalize basic understanding of  CHF, COPD, and DMII disease process and self health management plan . attend all scheduled medical appointments:   demonstrate Ongoing adherence to prescribed treatment plan for CHF, COPD, and DMII as evidenced by taking medications as prescribed, attending provider visits as scheduled, calling providers with questions or concerns as needed. work with pharmacist to address medications/disease managment related toCHF, COPD, and DMII through collaboration with Medical illustrator, provider, and care team.   Interventions: 1:1 collaboration with primary care provider regarding development and update of comprehensive plan of care as evidenced by provider attestation and co-signature Inter-disciplinary care team collaboration (see longitudinal plan of care) Evaluation of current treatment plan related to  self management and patient's adherence to plan as established by provider  Diabetes Interventions: Long term: Goal on Track Assessed patient's understanding of A1c goal: <7% Reviewed medications with patient and discussed importance of medication adherence Discussed plans with patient for ongoing  care management follow up and provided patient with direct contact information for care management team Upcoming/scheduled appointment reviewed Provided positive feedback regarding self management of health Lab Results  Component Value Date   HGBA1C 5.9 (H) 07/30/2021  COPD Interventions: Long Term: Goal on Track Advised patient to track and manage COPD triggers Advised patient to self assesses COPD action plan zone and make appointment with provider if in the yellow zone for 48 hours without improvement Reviewed COPD action plan and when to call the doctor Medications reviewed  Heart Failure Interventions: Long Term: Goal on Track Advised patient to weigh each morning after emptying bladder Discussed importance of daily  weight and advised patient to weigh and record daily Reviewed signs/symptoms of heart failure exacerbation and when to call the doctor Reviewed Congestive Heart Failure action plan  Patient Goals/Self-Care Activities: Attend all scheduled provider appointments Continue to take all medications as prescribed use salt in moderation dress right for the weather, hot or cold follow rescue plan if symptoms flare-up Try to check and record blood sugars daily. Goal is 80-130 before eating and less than 180 if check about 2 hours after eating. Call your provider for any health questions or concerns   Plan:Telephone follow up appointment with care management team member scheduled for:  10/20/21 The patient has been provided with contact information for the care management team and has been advised to call with any health related questions or concerns.    Thea Silversmith, RN, MSN, BSN, CCM Care Management Coordinator [facility] [phone]     I have reviewed and agree with [facility] documentation.  Kathlene November, MD

## 2021-09-26 LAB — BASIC METABOLIC PANEL
BUN/Creatinine Ratio: 15 (ref 10–24)
BUN: 13 mg/dL (ref 8–27)
CO2: 27 mmol/L (ref 20–29)
Calcium: 8.6 mg/dL (ref 8.6–10.2)
Chloride: 101 mmol/L (ref 96–106)
Creatinine, Ser: 0.87 mg/dL (ref 0.76–1.27)
Glucose: 130 mg/dL — ABNORMAL HIGH (ref 70–99)
Potassium: 4.8 mmol/L (ref 3.5–5.2)
Sodium: 143 mmol/L (ref 134–144)
eGFR: 87 mL/min/{1.73_m2} (ref >59)

## 2021-09-26 LAB — BRAIN NATRIURETIC PEPTIDE: BNP: 193.5 pg/mL — ABNORMAL HIGH (ref 0.0–100.0)

## 2021-10-01 ENCOUNTER — Other Ambulatory Visit: Payer: Self-pay | Admitting: Internal Medicine

## 2021-10-10 DIAGNOSIS — E785 Hyperlipidemia, unspecified: Secondary | ICD-10-CM

## 2021-10-10 DIAGNOSIS — I10 Essential (primary) hypertension: Secondary | ICD-10-CM

## 2021-10-10 DIAGNOSIS — E118 Type 2 diabetes mellitus with unspecified complications: Secondary | ICD-10-CM

## 2021-10-10 DIAGNOSIS — I5032 Chronic diastolic (congestive) heart failure: Secondary | ICD-10-CM | POA: Diagnosis not present

## 2021-10-10 DIAGNOSIS — E119 Type 2 diabetes mellitus without complications: Secondary | ICD-10-CM

## 2021-10-16 ENCOUNTER — Ambulatory Visit (INDEPENDENT_AMBULATORY_CARE_PROVIDER_SITE_OTHER): Payer: BLUE CROSS/BLUE SHIELD | Admitting: Pharmacist

## 2021-10-16 ENCOUNTER — Telehealth: Payer: Self-pay | Admitting: Internal Medicine

## 2021-10-16 DIAGNOSIS — J9611 Chronic respiratory failure with hypoxia: Secondary | ICD-10-CM

## 2021-10-16 DIAGNOSIS — I5032 Chronic diastolic (congestive) heart failure: Secondary | ICD-10-CM

## 2021-10-16 DIAGNOSIS — E118 Type 2 diabetes mellitus with unspecified complications: Secondary | ICD-10-CM

## 2021-10-16 DIAGNOSIS — E785 Hyperlipidemia, unspecified: Secondary | ICD-10-CM

## 2021-10-16 DIAGNOSIS — I10 Essential (primary) hypertension: Secondary | ICD-10-CM

## 2021-10-16 MED ORDER — DAPAGLIFLOZIN PROPANEDIOL 10 MG PO TABS: 10.0000 mg | ORAL_TABLET | Freq: Every day | ORAL | 1 refills | Status: DC

## 2021-10-16 NOTE — Patient Instructions (Signed)
Mr. Corky Crafts, It was a pleasure speaking with you today.  I have attached a summary of our visit today and information about your health goals.   Our next appointment is by telephone on Nov 19, 2021 at 9:00am  Please call the care guide team at [phone] if you need to cancel or reschedule your appointment.    If you have any questions or concerns, please feel free to contact me either at the phone number below or with a MyChart message.   Keep up the good work!  Cherre Robins, PharmD Clinical Pharmacist [facility] [phone] (direct line)  [phone] (main office number)  CARE PLAN ENTRY (see longitudinal plan of care for additional care plan information)  Current Barriers:  Chronic Disease Management support, education, and care coordination needs related to Hypertension, Hyperlipidemia/PAD/Cardiomyopathy, Diabetes, Afib, COPD, BPH, GERD   Hypertension / Heart Failure BP Readings from Last 3 Encounters:  09/17/21 112/74  09/14/21 126/72  09/07/21 130/80   Pharmacist Clinical Goal(s): Over the next 120 days, patient will work with PharmD and providers to maintain BP goal <130/80 Current regimen:  Amlodipine 10mg  daily  Losartan 50 take 1 tablet daily   Carvedilol 25mg  twice daily Farxiga 10mg  daily (beneficial for heart and blood glucose / diabetes) Interventions: Recommended he continue to check his blood pressure 2 to 3 time per week Patient self care activities - Over the next 120 days, patient will: Check blood pressure 2 to 3 times per week, document, and provide at future appointments Ensure daily salt intake < 2300 mg/day Continue current medications to lower blood pressure and heart  Check weight daily and record for future appointments.  Report signs and symptoms of CHF exacerbation - weight gain, shortness of breath, abdominal fullness, swelling in legs or abdomen, Fatigue and weakness, changes in ability to perform usual  activities, persistent cough or wheezing with white or pink blood-tinged mucus, nausea and lack of appetite Continue to weigh daily and record for future appointments  Hyperlipidemia/PAD Lab Results  Component Value Date/Time   LDLCALC 70 12/09/2020 01:27 PM   Pharmacist Clinical Goal(s): Over the next 120 days, patient will work with PharmD and providers to maintain LDL goal < 70 Current regimen:  Atorvastatin 20mg  daily Aspirin 81mg  daily Nitroglycerin 0.4mg  as needed Patient self care activities - Over the next 120 days, patient will: Maintain cholesterol medication regimen. Be physically active. Continue walking to mailbox as able. Work up to longer walks if able. Goal is to get at least 150 minutes of physical activity per week   Diabetes Lab Results  Component Value Date/Time   HGBA1C 5.9 (H) 07/30/2021 04:53 AM   HGBA1C 5.9 (H) 03/05/2021 05:29 AM   Pharmacist Clinical Goal(s): Over the next 120 days, patient will work with PharmD and providers to maintain A1c goal <7% Current regimen:  Januvia 100mg  daily [place] 10mg  daily Interventions: Discussed a1c goal  Patient self care activities - Over the next 90 days, patient will: Check blood sugar 2 to 3 times per week, document, and provide at future appointments Reviewed home blood glucose readings and reviewed goals  Fasting blood glucose goal (before meals) = 80 to 130 Blood glucose goal after a meal = less than 180  Contact provider with any episodes of hypoglycemia  /blood glucose < 80  GERD / acid reflux Pharmacist Clinical Goal(s) Over the next 120 days, patient will work with PharmD and providers to reduce symptoms of GERD and reduce polypharmacy Current  regimen:  Pantoprazole 40mg  daily Interventions: Discussed the risk/benefit of continuation vs discontinuation of long term pantoprazole use  Patient self care activities - Over the next 120 days, patient will: Maintain current regimen for GERD / acid  reflux  Medication management Pharmacist Clinical Goal(s): Over the next 120 days, patient will work with PharmD and providers to maintain optimal medication adherence Current pharmacy: [facility] Interventions Comprehensive medication review performed. Continue current medication management strategy Reviewed refill history and assess adherence - patient has filled medications on time over the last 3 months.   Patient Goals/Self-Care Activities Over the next 120 days, patient will:  take medications as prescribed check blood pressure daily, document, and provide at future appointments Check weight daily and record for future appointments.  Report signs and symptoms of CHF exacerbation - weight gain, shortness of breath, abdominal fullness, swelling in legs or abdomen, Fatigue and weakness, changes in ability to perform usual activities, persistent cough or wheezing with white or pink blood-tinged mucus, nausea and lack of appetite Continue to weigh daily - report weight gain of more than 3 lbs in 24 hours or 5 lbs in 1 week. target a minimum of 150 minutes of moderate intensity exercise weekly  Follow Up Plan: The care management team will reach out to the patient again over the next 30-45 days. Patient will have phone visit with care management nurse 10/2021; Has appointment with PCP 11/2021.  Patient verbalizes understanding of instructions provided today and agrees to view in [place].

## 2021-10-16 NOTE — Telephone Encounter (Signed)
Rx sent 

## 2021-10-16 NOTE — Chronic Care Management (AMB) (Signed)
/ ° ° °Chronic Care Management °Pharmacy Note ° °10/16/2021 °Name:  Bradley J Soderholm Sr. MRN:  [id number] DOB:  12/23/1940 ° °Summary:  °Patient report breathing better and weight has been stable since furosemide dose increase 09/17/2021 by Dr Dundas. He is taking furosemide 80mg each morning and 40mg each evening. °He will see Dr Paz 11/11/2021. Since cardiology started Farxiga for HFpEF 09/08/2021. If A1c is still < 6.5% consider stopping Januvia 100mg daily. °Requested copy of 2022 diabetic eye exam from [facility] - they are faxing records.  ° °Follow up: phone visit in 5 weeks after next appt with PCP.  ° °Subjective: °Bradley J Stalvey Sr. is an 81 y.o. year old male who is a primary patient of Paz, Jose E, MD.  The CCM team was consulted for assistance with disease management and care coordination needs.   ° °Engaged with patient by telephone for follow up visit in response to provider referral for pharmacy case management and/or care coordination services.  ° °Consent to Services:  °The patient was given information about Chronic Care Management services, agreed to services, and gave verbal consent prior to initiation of services.  Please see initial visit note for detailed documentation.  ° °Patient Care Team: °Paz, Jose E, MD as PCP - General (Internal Medicine) °[place], Tiffany, MD as PCP - Cardiology (Cardiology) °Taylor, Gregg W, MD as Consulting Physician (Cardiology) °Wert, Michael B, MD as Consulting Physician (Pulmonary Disease) °Gore, Mitchell, MD as Consulting Physician (Otolaryngology) °Poudyal, Ritesh, OD (Optometry) °Eckard, Tammy, RPH-CPP (Pharmacist) °Wallace, Juana M, RN as Case Manager ° °Recent office visits: °09/14/2021 - Int Med (Dr Paz) Hospital follow up. No med changes noted. Labs ordered ionized calcium, LFTs and CBC. Ordered chest xray. °08/11/2021 - Int Med (Dr Paz) Hospital follow up. No medication changes noted.  °04/10/2021 - PCP (Dr Paz) recheck type 2 DM and COPD. No  med changes. Noted patient has completed prednisone prescribed at discharge °  ° °Recent consult visits: °09/17/2021 - Cardio (Dr Randoph) Follow up CHF / SOB. Increased furosemide 40mg to take 80mg each morning and 40mg each afternoon. Check BMP and BNP in 1 week.  °09/07/2021 - Cardio (Clegg, NP) Seen for HFpEF. Started Farxiag 10mg daily at breakfast.  °06/03/21 Cardio (Dr [place])- Seen for general follow up. EKG completed. Follow up in 6 months.  ° °Hospital visits: °08/29/2021 to 09/02/2021 Hospitalized at [facility] for COPD/CHF  exacerbation °Medications started at discharge: predinsone 40mg daily for 2 days; doxycyline 100mg twice a day for 2 days. ° °07/29/2021 to 08/03/2021 Hospitalized at [facility] for Acute on chronic respiratory familure. Treated with steroids for COPD exacerbations and IV diuretics for CHF.    °Medications started at discharge: prednisone 50mg daily with breakfast and loratidine 10mg daily; doxycycline 100mg twice a day; guiafenesin twice a day; losartan 50mg take 0.5 tablet = 25mg daily  °Medications stopped at discharge: Tylenol PM, °Medication changes at discharge: amlodipine dose lowered to 5mg daily; Furosemide dose increased to take 80mg daily except on MWF take 40mg in am and 20mg in afternoon. ° ° °Objective: ° °Lab Results  °Component Value Date  ° CREATININE 0.87 09/25/2021  ° CREATININE 0.83 09/07/2021  ° CREATININE 0.74 09/02/2021  ° ° °Lab Results  °Component Value Date  ° HGBA1C 5.9 (H) 07/30/2021  ° °Last diabetic Eye exam:  °Lab Results  °Component Value Date/Time  ° HMDIABEYEEXA No Retinopathy 05/06/2020 12:00 AM  °  °Last diabetic Foot exam:  °Lab Results  °  Component Value Date/Time  ° HMDIABFOOTEX Done 07/15/2015 12:00 AM  °  ° °   °Component Value Date/Time  ° CHOL 127 12/09/2020 1327  ° TRIG 103.0 12/09/2020 1327  ° HDL 36.20 (L) 12/09/2020 1327  ° CHOLHDL 4 12/09/2020 1327  ° VLDL 20.6 12/09/2020 1327  ° LDLCALC 70 12/09/2020 1327  ° ° °Hepatic Function Latest  Ref Rng & Units 09/14/2021 07/30/2021 07/29/2021  °Total Protein 6.0 - 8.3 g/dL 5.9(L) 6.7 6.9  °Albumin 3.5 - 5.2 g/dL 3.5 3.2(L) 3.4(L)  °AST 0 - 37 U/L 14 18 26  °ALT 0 - 53 U/L 14 14 13  °Alk Phosphatase 39 - 117 U/L 65 89 90  °Total Bilirubin 0.2 - 1.2 mg/dL 0.5 0.7 1.0  °Bilirubin, Direct 0.0 - 0.3 mg/dL 0.1 - -  ° ° °Lab Results  °Component Value Date/Time  ° TSH 1.61 06/07/2018 02:13 PM  ° TSH 0.300 (L) 05/25/2018 04:46 PM  ° TSH 1.76 11/01/2017 11:08 AM  ° ° °CBC Latest Ref Rng & Units 09/14/2021 09/01/2021 08/31/2021  °WBC 4.0 - 10.5 K/uL 7.3 8.1 8.3  °Hemoglobin 13.0 - 17.0 g/dL 12.3(L) 11.9(L) 11.6(L)  °Hematocrit 39.0 - 52.0 % 36.8(L) 34.9(L) 34.5(L)  °Platelets 150.0 - 400.0 K/uL 172.0 228 235  ° ° °No results found for: VD25OH ° °Clinical ASCVD: Yes  °The ASCVD Risk score (Arnett DK, et al., 2019) failed to calculate for the following reasons: °  The 2019 ASCVD risk score is only valid for ages 40 to 79   ° ° °Social History  ° °Tobacco Use  °Smoking Status Former  ° Packs/day: 2.50  ° Years: 25.00  ° Pack years: 62.50  ° Types: Cigarettes, Cigars  ° Quit date: 10/11/1980  ° Years since quitting: 41.0  °Smokeless Tobacco Never  ° °BP Readings from Last 3 Encounters:  °09/17/21 112/74  °09/14/21 126/72  °09/07/21 130/80  ° °Pulse Readings from Last 3 Encounters:  °09/17/21 87  °09/14/21 69  °09/07/21 77  ° °Wt Readings from Last 3 Encounters:  °09/17/21 189 lb 12.8 oz (86.1 kg)  °09/14/21 190 lb 6 oz (86.4 kg)  °09/07/21 185 lb 12.8 oz (84.3 kg)  ° ° °Assessment: Review of patient past medical history, allergies, medications, health status, including review of consultants reports, laboratory and other test data, was performed as part of comprehensive evaluation and provision of chronic care management services.  ° °SDOH:  (Social Determinants of Health) assessments and interventions performed:  °SDOH Interventions   ° °Flowsheet Row Most Recent Value  °SDOH Interventions   °Physical Activity Interventions  Other (Comments)  [O2 dependent]  ° °  ° ° °CCM Care Plan ° °Allergies  °Allergen Reactions  ° Hydrocodone Other (See Comments)  °  "given to him in the hospital; went thru withdrawals once home; dr said not to take it again" (09/27/2012)  ° Tramadol Other (See Comments)  °  "given to him in the hospital; went thru withdrawals once home; dr said not to take it again" (09/27/2012)  ° Tadalafil Other (See Comments)  °  headache, backache  ° Avelox [Moxifloxacin Hydrochloride] Itching and Other (See Comments)  °  Headache  ° ° °Medications Reviewed Today   ° ° Reviewed by Eckard, Tammy, RPH-CPP (Pharmacist) on 10/16/21 at 0859  Med List Status: <None>  ° °Medication Order Taking? Sig Documenting Provider Last Dose Status Informant  °acetaminophen (TYLENOL) 500 MG tablet [id number] Yes Take 500 mg by mouth every 6 (six) hours as needed   for headache or moderate pain. [provider] Taking Active Self  °albuterol (VENTOLIN HFA) 108 (90 Base) MCG/ACT inhaler [id number] Yes Inhale 2 puffs into the lungs every 6 (six) hours as needed for wheezing or shortness of breath. Ghimire, Shanker M, MD Taking Active Self  °amLODipine (NORVASC) 10 MG tablet [id number] Yes Take 10 mg by mouth daily. [provider] Taking Active   °aspirin 81 MG tablet [id number] Yes Take 81 mg by mouth every morning.  [provider] Taking Active Self  °atorvastatin (LIPITOR) 20 MG tablet [id number] Yes TAKE 1 TABLET AT BEDTIME Paz, Jose E, MD Taking Active Self  °budesonide (PULMICORT) 0.5 MG/2ML nebulizer solution [id number] Yes Take 2 mLs (0.5 mg total) by nebulization 2 (two) times daily. Paz, Jose E, MD Taking Active Self  °calcium-vitamin D (OSCAL WITH D) 500-200 MG-UNIT tablet [id number] Yes Take 1 tablet by mouth every morning. [provider] Taking Active Self  °carvedilol (COREG) 25 MG tablet [id number] Yes TAKE 1 TABLET TWICE A DAY WITH MEALS Paz, Jose E, MD Taking Active Self  °cholecalciferol (VITAMIN D3) 25  MCG (1000 UNIT) tablet [id number] Yes Take 1,000 Units by mouth daily. [provider] Taking Active Self  °dapagliflozin propanediol (FARXIGA) 10 MG TABS tablet [id number] Yes Take 1 tablet (10 mg total) by mouth daily before breakfast. Clegg, Amy D, NP Taking Active   °diphenhydramine-acetaminophen (TYLENOL PM) 25-500 MG TABS tablet [id number] Yes Take 1 tablet by mouth at bedtime as needed. [provider] Taking Active Self  °         °Med Note (COLLERAN, EMER M   Mon Sep 07, 2021 12:08 PM)    °furosemide (LASIX) 40 MG tablet [id number] Yes TAKE 2 TABLETS EVERY MORNING AND 1 TABLET IN THE LATE AFTERNOON , Tiffany, MD Taking Active   °ipratropium-albuterol (DUONEB) 0.5-2.5 (3) MG/3ML SOLN [id number] Yes Take 3 mLs by nebulization 2 (two) times daily as needed. Paz, Jose E, MD Taking Active Self  °Lancets (ONETOUCH ULTRASOFT) lancets [id number] Yes Check blood sugars no more than twice daily Paz, Jose E, MD Taking Active Self  °loratadine (CLARITIN) 10 MG tablet [id number] Yes Take 1 tablet (10 mg total) by mouth daily. Vann, Jessica U, DO Taking Active Self  °losartan (COZAAR) 50 MG tablet [id number] Yes TAKE 1 TABLET DAILY Paz, Jose E, MD Taking Active   °Multiple Vitamins-Minerals (MULTIVITAMINS THER. W/MINERALS) TABS tablet [id number] Yes Take 1 tablet by mouth daily. Paz, Jose E, MD Taking Active Self  °nitroGLYCERIN (NITROSTAT) 0.4 MG SL tablet [id number] Yes Place 1 tablet (0.4 mg total) under the tongue every 5 (five) minutes x 3 doses as needed for chest pain. Paz, Jose E, MD Taking Active   °         °Med Note (CANTER, KAYLYN D   Mon Sep 14, 2021  2:33 PM) PRN  °ONETOUCH VERIO test strip [id number] Yes USE TO CHECK BLOOD SUGAR NO MORE THAN TWICE A DAY Paz, Jose E, MD Taking Active Self  °pantoprazole (PROTONIX) 40 MG tablet [id number] Yes Take 1 tablet (40 mg total) by mouth daily before breakfast. Paz, Jose E, MD Taking Active   °sitaGLIPtin (JANUVIA) 100 MG tablet [id number] Yes TAKE 1  TABLET DAILY Paz, Jose E, MD Taking Active Self  °tamsulosin (FLOMAX) 0.4 MG CAPS capsule [id number] Yes TAKE 1 CAPSULE DAILY AFTER SUPPER Paz, Jose E, MD Taking Active Self  °zolpidem (AMBIEN) 10 MG tablet [id number] No Take 10 mg by mouth at bedtime.  °Patient not taking:   Reported on 10/16/2021   [provider] Not Taking Active Self            Patient Active Problem List   Diagnosis Date Noted   GERD without esophagitis 07/30/2021   Cor pulmonale (chronic) ([place]) 08/05/2020   COPD exacerbation ([place]) 12/22/2018   COPD GOLD 0 spirometry/ AB  12/22/2018   PCP NOTES >>> 07/15/2015   Annual physical exam 05/08/2012   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   Sinoatrial node dysfunction ([place])    Non-ischemic cardiomyopathy ([place]) 09/06/2011   Acute on chronic diastolic CHF (congestive heart failure) ([place])    PAD (peripheral artery disease) ([place]) 10/22/2010   DERMATITIS, STASIS 09/14/2010   Chronic respiratory failure with hypoxia and hypercapnia ([place]) 09/14/2010   Chronic pulmonary hypertension secondary to elevated L H pressures  04/15/2009   ERECTILE DYSFUNCTION 01/06/2009   Mixed diabetic hyperlipidemia associated with type 2 diabetes mellitus ([place]) 12/13/2007   Essential hypertension 12/13/2007   Anxiety--insomnia 08/14/2007   Type 2 diabetes mellitus without complication, without long-term current use of insulin ([place]) 01/02/2007   Chronic atrial fibrillation ([place]) 01/02/2007   Benign enlargement of prostate 01/02/2007    Immunization History  Administered Date(s) Administered   Fluad Quad(high Dose 65+) 06/26/2019, 08/11/2020   Influenza Split 10/01/2011   Influenza Whole 08/14/2007, 07/11/2009, 09/14/2010   Influenza, High Dose Seasonal PF 09/14/2013, 07/15/2015, 07/09/2016, 07/05/2017, 08/03/2018   Influenza,inj,Quad PF,6+ Mos 07/05/2014   Influenza-Unspecified 08/11/2012, 08/11/2021   Janssen (J&J) SARS-COV-2 Vaccination 01/12/2020, 09/19/2020   PFIZER Comirnaty(Gray  Top)Covid-19 Tri-Sucrose Vaccine 03/18/2021   Pfizer Covid-19 Vaccine Bivalent Booster 10yr & up 08/11/2021   Pneumococcal Conjugate-13 03/12/2015   Pneumococcal Polysaccharide-23 07/11/2004, 03/30/2009, 11/01/2017, 05/11/2019   Td 03/12/2003   Tetanus 09/14/2013   Zoster, Live 04/20/2011    Conditions to be addressed/monitored: Atrial Fibrillation, CHF, HTN, HLD, COPD, DMII, and enlarged prostate, PVD, GERD  Care Plan : General Pharmacy (Adult)  Updates made by ECherre Robins RPH-CPP since 10/16/2021 12:00 AM     Problem: Hypertension, Hyperlipidemia/PAD/Cardiomyopathy, Diabetes, Afib, COPD   Priority: High  Onset Date: 12/15/2020     Long-Range Goal: Provide education, support and care coordination for medication therapy and chronic conditions   Start Date: 12/15/2020  Expected End Date: 12/18/2021  This Visit's Progress: On track  Recent Progress: On track  Priority: High  Note:   Current Barriers:  Education needed regarding CHF and prevention of exacerbations Assist with medication adherence and education  Pharmacist Clinical Goal(s):  Over the next 120 days, patient will achieve adherence to monitoring guidelines and medication adherence to achieve therapeutic efficacy maintain control of blood pressure and blood glucose as evidenced by home monitoring  adhere to prescribed medication regimen as evidenced by fill dates through collaboration with PharmD and provider.   Interventions: 1:1 collaboration with PColon Branch MD regarding development and update of comprehensive plan of care as evidenced by provider attestation and co-signature Inter-disciplinary care team collaboration (see longitudinal plan of care) Comprehensive medication review performed; medication list updated in electronic medical record  Hypertension / HFpEF (BP goal <130/80 and prevent CHF exacerbations and hospitalizations) Blood pressure at goal; had 3 CHF / COPD exacerbations in 2022  Current  treatment: Amlodipine 153mdaily  Losartan 50 mg daily Carvedilol 2545mwice daily Farxiga 21m60mily (Added 09/08/2021) Furosemide 40mg61make 2 tablets every morning and 1 tablet each afternoon (dose increased 09/17/2021) Medications previously tried: none noted Current blood pressure home reading: 135/68 Home weights - patient is checking daily Reports  weight only varies by 1 or 2 lbs (did not have weight records to tell exact numbers today) Denies hypotensive/hypertensive symptoms Patient denies lower extremity edema; Denies shortness of breath at rest or at night when lying down, denies nausea or abdominal fullness Interventions:  Reviewed signs and symptoms of CHF exacerbation - weight gain, SOB, abdominal fullness, swelling in legs or abdomen, Fatigue and weakness, changes in ability to perform usual activities, persistent cough or wheezing with white or pink blood-tinged mucus, nausea and lack of appetite Continue to weigh daily - report weight gain of more than 3 lbs in 24 hours or 5 lbs in 1 week. Reveiwed the importance of letting either PCP or cardiologist know about weight changes to avoid exacerbations and hospitalizaiton Educated on blood pressure goals and benefits of medications for prevention of heart attack, stroke and kidney damage; Counseled to monitor BP at home 1 or 2 times per week, document, and provide log at future appointments Recommended to continue current medications for blood pressure and HFpEF  Hyperlipidemia: (LDL goal < 70) / PAD Controlled Current treatment: Atorvastatin 11m daily Aspirin 8518mdaily Nitroglucerin 0.18m71ms needed (last needed over 1 year ago) Medications previously tried: none noted  Interventions: (addressed at previous visit) Reviewed most recent lipid panel, LDL now at goal.   Reviewed cholesterol goals;  Benefits of statin for ASCVD risk reduction  Recommended to continue current medication for cholesterol lowering  Diabetes (A1c  goal <7%) Controlled; last A1c was 5.9% Current medications: Januvia 100m53mily Farxiga 10mg12mly  Patient endorses [place] - has Medicare and insurance with previous employer Rx coverage.  90 day supply = $11. Medications previously tried: metformin (heart failure?), glyburide (D/C to deprescribe per low a1c) Current home glucose readings Ranges from 105 to 140 Denies BG <80 Denies hypoglycemic/hyperglycemic symptoms Current meal patterns: states his wife and granddaughter make sure he "eats right"  [facility] but not on file in EMR. Interventions:  Educated on A1c and blood sugar goals Since Farxiga started for CHF, consider stopping Januvia if A1c in Feb 2023 is < 6.5%  Reviewed home blood glucose readings and reviewed goals  Fasting blood glucose goal (before meals) = 80 to 130 Blood glucose goal after a meal = less than 180  Counseled to check feet daily and get yearly eye exams - [facility] Recommended to continue current medication  Atrial Fibrillation (Goal: prevent stroke and major bleeding) Controlled Current treatment: Rate control: Carvedilol 25mg 37me daily Anticoagulation: Aspirin 81mg d32m Medications previously tried: coumadin (intracranial bleed in 2013) Interventions:  Reviewed signs and symptoms for bleeding to monitor for regarding aspirin Recommended to continue current medication and follow up with cardiologist.   COPD (Goal: control symptoms and prevent exacerbations) Controlled Managed by pulmonology - Dr Wert (lMelvyn Novasappt 02/2021) Current treatment  Pulmicort (Budesonide) 0.5mg/2mL70mbulized twice daily DuoNeb (Ipratropium/Albuterol) 0.5/2.5/3mL nebu68med twice daily as needed Albuterol HFA - inhale 1 or 2 puff every 4 to 6 hours as needed for shortness of breath or wheezing Supplement oxygen - 2  to 3 L to keep O2 sats >90% Medications previously tried: none noted  Gold Grade: Gold 0 per DR Wert's last note 03/2021 Current COPD Classification:  C (low sx, >2 exacerbations/yr) Has had 3 Hospitalizations in last year for COPD / CHF Interventions:  Reviewed medication regimen  for COPD °Reviewed O2 sat measurement; continue to check daily °Recommended to continue current medication ° °Health Maintenance:  °Reviewed vaccination history and discussed benefits of Shingrix vaccination °Patient considering the following vaccines at next provider appointment, local pharmacy or [facility]: Shingrix vaccine °Requested copy of 2022 diabetic eye exam - [facility] faxing over.  ° °Patient Goals/Self-Care Activities °Over the next 120 days, patient will:  °take medications as prescribed °check blood pressure daily, document, and provide at future appointments °Check weight daily and record for future appointments.  °Report signs and symptoms of CHF exacerbation - weight gain, shortness of breath, abdominal fullness, swelling in legs or abdomen, Fatigue and weakness, changes in ability to perform usual activities, persistent cough or wheezing with white or pink blood-tinged mucus, nausea and lack of appetite °Continue to weigh daily - report weight gain of more than 3 lbs in 24 hours or 5 lbs in 1 week. °target a minimum of 150 minutes of moderate intensity exercise weekly ° °Follow Up Plan: The care management team will reach out to the patient again over the next 30-45 days. Patient will have phone visit with care management nurse 10/2021. Sees PCP 11/11/2021.  °  ° ° °Medication Assistance: None required.  Patient affirms current coverage meets needs. ° °Patient's preferred pharmacy is: ° °[facility] - [place], [place] - [address] °[address] °[place] [address] °Phone: [phone] Fax: [phone] ° °[id number] - [place], [place] - [address] °[address] °[place] [address] °Phone: [phone] Fax: [phone] ° ° °Follow Up:  Patient agrees to Care Plan and Follow-up. ° °Plan: Telephone follow up appointment with care management team member scheduled for:  10/20/2021 with care management nurse; 11/19/2021 with clinical pharmacist ° °Tammy Eckard, PharmD °Clinical Pharmacist °[facility] °[facility] ° ° ° ° °

## 2021-10-16 NOTE — Telephone Encounter (Signed)
Pt stated could he get his 90day supply of this medication sent to the pharmacy below.  Medication:  dapagliflozin propanediol (FARXIGA) 10 MG TABS tablet  Has the patient contacted their pharmacy? No. (If no, request that the patient contact the pharmacy for the refill.) (If yes, when and what did the pharmacy advise?)  Preferred Pharmacy (with phone number or street name): [facility] - [place], [place] - [address]  [address] [place] [address]  Phone:  [phone]  Fax:  [phone]  Agent: Please be advised that RX refills may take up to 3 business days. We ask that you follow-up with your pharmacy.

## 2021-10-19 ENCOUNTER — Encounter: Payer: Self-pay | Admitting: Internal Medicine

## 2021-10-20 ENCOUNTER — Ambulatory Visit: Payer: Medicare Other

## 2021-10-20 DIAGNOSIS — E118 Type 2 diabetes mellitus with unspecified complications: Secondary | ICD-10-CM

## 2021-10-20 DIAGNOSIS — J9611 Chronic respiratory failure with hypoxia: Secondary | ICD-10-CM

## 2021-10-20 DIAGNOSIS — I5032 Chronic diastolic (congestive) heart failure: Secondary | ICD-10-CM

## 2021-10-20 NOTE — Patient Instructions (Signed)
Visit Information  Thank you for taking time to visit with me today. Please don't hesitate to contact me if I can be of assistance to you before our next scheduled telephone appointment.  Following are the goals we discussed today:  Patient Goals/Self-Care Activities: Attend all scheduled provider appointments Continue to take all medications as prescribed dress right for the weather, hot or cold Continue to eat healthy: lean protein, fruits, vegetables, healthy fats, monitor salt intake follow rescue plan if symptoms flare-up Continue to be active and exercise per provider recommendation. Continue to check and record blood sugars daily. Goal is 80-130 before eating and less than 180 if check about 2 hours after eating. Call your provider for any health questions or concerns Continue to work with Care Management department for ongoing care management and/or care coordination needs.  Our next appointment is by telephone on 12/18/21 at 10:45  Please call the care guide team at [phone] if you need to cancel or reschedule your appointment.   If you are experiencing a Mental Health or [place] or need someone to talk to, please call the Suicide and Crisis Lifeline: 988 call 1-800-273-TALK (toll free, 24 hour hotline)   Patient verbalizes understanding of instructions provided today and agrees to view in [place].   Thea Silversmith, RN, MSN, BSN, CCM Care Management Coordinator [facility] [phone]

## 2021-10-20 NOTE — Chronic Care Management (AMB) (Addendum)
Chronic Care Management   CCM RN Visit Note  10/20/2021 Name: Bradley LAUDERMAN Sr. MRN: [id number] DOB: 1940-12-27  Subjective: Bradley Searles Sr. is a 81 y.o. year old male who is a primary care patient of Bradley Plump, MD. The care management team was consulted for assistance with disease management and care coordination needs.    Engaged with patient by telephone for follow up visit in response to provider referral for case management and/or care coordination services.   Consent to Services:  The patient was given information about Chronic Care Management services, agreed to services, and gave verbal consent prior to initiation of services.  Please see initial visit note for detailed documentation.   Patient agreed to services and verbal consent obtained.   Assessment: Review of patient past medical history, allergies, medications, health status, including review of consultants reports, laboratory and other test data, was performed as part of comprehensive evaluation and provision of chronic care management services.   SDOH (Social Determinants of Health) assessments and interventions performed:    CCM Care Plan  Allergies  Allergen Reactions   Hydrocodone Other (See Comments)    "given to him in the hospital; went thru withdrawals once home; dr said not to take it again" (09/27/2012)   Tramadol Other (See Comments)    "given to him in the hospital; went thru withdrawals once home; dr said not to take it again" (09/27/2012)   Tadalafil Other (See Comments)    headache, backache   Avelox [Moxifloxacin Hydrochloride] Itching and Other (See Comments)    Headache    Outpatient Encounter Medications as of 10/20/2021  Medication Sig Note   acetaminophen (TYLENOL) 500 MG tablet Take 500 mg by mouth every 6 (six) hours as needed for headache or moderate pain.    albuterol (VENTOLIN HFA) 108 (90 Base) MCG/ACT inhaler Inhale 2 puffs into the lungs every 6 (six) hours as needed for wheezing or  shortness of breath.    amLODipine (NORVASC) 10 MG tablet Take 10 mg by mouth daily.    aspirin 81 MG tablet Take 81 mg by mouth every morning.     atorvastatin (LIPITOR) 20 MG tablet TAKE 1 TABLET AT BEDTIME    budesonide (PULMICORT) 0.5 MG/2ML nebulizer solution Take 2 mLs (0.5 mg total) by nebulization 2 (two) times daily.    calcium-vitamin D (OSCAL WITH D) 500-200 MG-UNIT tablet Take 1 tablet by mouth every morning.    carvedilol (COREG) 25 MG tablet TAKE 1 TABLET TWICE A DAY WITH MEALS    cholecalciferol (VITAMIN D3) 25 MCG (1000 UNIT) tablet Take 1,000 Units by mouth daily.    dapagliflozin propanediol (FARXIGA) 10 MG TABS tablet Take 1 tablet (10 mg total) by mouth daily before breakfast.    diphenhydramine-acetaminophen (TYLENOL PM) 25-500 MG TABS tablet Take 1 tablet by mouth at bedtime as needed.    furosemide (LASIX) 40 MG tablet TAKE 2 TABLETS EVERY MORNING AND 1 TABLET IN THE LATE AFTERNOON    ipratropium-albuterol (DUONEB) 0.5-2.5 (3) MG/3ML SOLN Take 3 mLs by nebulization 2 (two) times daily as needed.    loratadine (CLARITIN) 10 MG tablet Take 1 tablet (10 mg total) by mouth daily.    losartan (COZAAR) 50 MG tablet TAKE 1 TABLET DAILY    Multiple Vitamins-Minerals (MULTIVITAMINS THER. W/MINERALS) TABS tablet Take 1 tablet by mouth daily.    nitroGLYCERIN (NITROSTAT) 0.4 MG SL tablet Place 1 tablet (0.4 mg total) under the tongue every 5 (five) minutes x 3 doses  as needed for chest pain. 09/14/2021: PRN   pantoprazole (PROTONIX) 40 MG tablet Take 1 tablet (40 mg total) by mouth daily before breakfast.    sitaGLIPtin (JANUVIA) 100 MG tablet TAKE 1 TABLET DAILY    tamsulosin (FLOMAX) 0.4 MG CAPS capsule TAKE 1 CAPSULE DAILY AFTER SUPPER    Lancets (ONETOUCH ULTRASOFT) lancets Check blood sugars no more than twice daily    ONETOUCH VERIO test strip USE TO CHECK BLOOD SUGAR NO MORE THAN TWICE A DAY    zolpidem (AMBIEN) 10 MG tablet Take 10 mg by mouth at bedtime. (Patient not taking:  Reported on 10/16/2021)    No facility-administered encounter medications on file as of 10/20/2021.    Patient Active Problem List   Diagnosis Date Noted   GERD without esophagitis 07/30/2021   Cor pulmonale (chronic) (HCC) 08/05/2020   COPD exacerbation (HCC) 12/22/2018   COPD GOLD 0 spirometry/ AB  12/22/2018   PCP NOTES >>> 07/15/2015   Annual physical exam 05/08/2012   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   Sinoatrial node dysfunction (HCC)    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   Acute on chronic diastolic CHF (congestive heart failure) (HCC)    PAD (peripheral artery disease) (HCC) 10/22/2010   DERMATITIS, STASIS 09/14/2010   Chronic respiratory failure with hypoxia and hypercapnia (HCC) 09/14/2010   Chronic pulmonary hypertension secondary to elevated L H pressures  04/15/2009   ERECTILE DYSFUNCTION 01/06/2009   Mixed diabetic hyperlipidemia associated with type 2 diabetes mellitus (HCC) 12/13/2007   Essential hypertension 12/13/2007   Anxiety--insomnia 08/14/2007   Type 2 diabetes mellitus without complication, without long-term current use of insulin (HCC) 01/02/2007   Chronic atrial fibrillation (HCC) 01/02/2007   Benign enlargement of prostate 01/02/2007    Conditions to be addressed/monitored:CHF, HTN, COPD, and DMII  Care Plan : RN Care Manager Plan of Care  Updates made by Bradley Maryland, RN since 10/20/2021 12:00 AM     Problem: No Plan of Care Established for Management of Chronic Disease (COPD, CHF)   Priority: High     Long-Range Goal: Development of Plan of Care for Chronic Disease (COPD, CHF)   Start Date: 08/17/2021  Expected End Date: 02/14/2022  Recent Progress: On track  Priority: High  Note:   Current Barriers: Bradley [name] states, "everything is going well". Reports he has not had to use his rescue inhaler. No signs/symptoms of COPD exacerbation. He continues to weigh self daily. Denies any signs/symptoms of CHF exacerbation. He reports blood  pressures have been within normal range less than 140/90. Wife continues to check Bradley [name] blood sugar. He does not have results in front of him, but he reports blood sugars are within normal range. Bradley [name] states, "I have no stress at all in my life" and states he is enjoying his grand/great grandchildren. Chronic Disease Management support and education needs related to CHF and COPD  RNCM Clinical Goal(s):  Patient will verbalize basic understanding of  CHF, COPD, and DMII disease process and self health management plan . attend all scheduled medical appointments:   demonstrate Ongoing adherence to prescribed treatment plan for CHF, COPD, and DMII as evidenced by taking medications as prescribed, attending provider visits as scheduled, calling providers with questions or concerns as needed. work with pharmacist to address medications/disease managment related toCHF, COPD, and DMII through collaboration with Medical illustrator, provider, and care team.   Interventions: 1:1 collaboration with primary care provider regarding development and update of comprehensive plan of care as evidenced by provider  attestation and co-signature Inter-disciplinary care team collaboration (see longitudinal plan of care) Evaluation of current treatment plan related to  self management and patient's adherence to plan as established by provider  Diabetes Interventions: Long term: Goal on Track Assessed patient's understanding of A1c goal: <7% Reviewed medications with patient and discussed importance of medication adherence Discussed plans with patient for ongoing care management follow up and provided patient with direct contact information for care management team Upcoming/scheduled appointment reviewed Provided positive feedback regarding self management of health Discussed goal FBS 80-130 and <180 after eating Lab Results  Component Value Date   HGBA1C 5.9 (H) 07/30/2021  COPD Interventions: Long Term: Goal  on Track Advised patient to track and manage COPD triggers Discussed COPD exacerbation Medications reviewed and encouraged to continue to take medications as recommended Encouraged to continue follow up with provider visits as scheduled  Heart Failure Interventions: Long Term: Goal on Track Advised patient to weigh each morning after emptying bladder Reviewed signs/symptoms of heart failure exacerbation and when to call the doctor Encouraged to continue to take medications as prescribed  Patient Goals/Self-Care Activities: Attend all scheduled provider appointments Continue to take all medications as prescribed dress right for the weather, hot or cold Continue to eat healthy: lean protein, fruits, vegetables, healthy fats, monitor salt intake follow rescue plan if symptoms flare-up Continue to be active and exercise per provider recommendation. Continue to check and record blood sugars daily. Goal is 80-130 before eating and less than 180 if check about 2 hours after eating. Call your provider for any health questions or concerns Continue to work with Care Management department for ongoing care management and/or care coordination needs.   Plan:Telephone follow up appointment with care management team member scheduled for:  12/18/21 The patient has been provided with contact information for the care management team and has been advised to call with any health related questions or concerns.   Kathyrn Sheriff, RN, MSN, BSN, CCM Care Management Coordinator [facility] [phone]    I have reviewed and agree with [facility] documentation.  [name], MD

## 2021-10-23 ENCOUNTER — Telehealth: Payer: BLUE CROSS/BLUE SHIELD

## 2021-10-29 ENCOUNTER — Telehealth: Payer: Self-pay | Admitting: Internal Medicine

## 2021-10-29 MED ORDER — SITAGLIPTIN PHOSPHATE 100 MG PO TABS: 100.0000 mg | ORAL_TABLET | Freq: Every day | ORAL | 1 refills | Status: DC

## 2021-10-29 NOTE — Telephone Encounter (Signed)
Rx sent 

## 2021-10-29 NOTE — Telephone Encounter (Signed)
Pt said he just got his last rx of medication below and wanted to see if he could get his new one called in so they can have it ahead of time.  Medication:  sitaGLIPtin (JANUVIA) 100 MG tablet  Has the patient contacted their pharmacy? No. (If no, request that the patient contact the pharmacy for the refill.) (If yes, when and what did the pharmacy advise?)  Preferred Pharmacy (with phone number or street name):  [place], [place]  [address] [place] [address]  Phone:  [phone]  Fax:  [phone]   Agent: Please be advised that RX refills may take up to 3 business days. We ask that you follow-up with your pharmacy.

## 2021-11-10 DIAGNOSIS — E785 Hyperlipidemia, unspecified: Secondary | ICD-10-CM | POA: Diagnosis not present

## 2021-11-10 DIAGNOSIS — I5032 Chronic diastolic (congestive) heart failure: Secondary | ICD-10-CM | POA: Diagnosis not present

## 2021-11-10 DIAGNOSIS — I10 Essential (primary) hypertension: Secondary | ICD-10-CM

## 2021-11-10 DIAGNOSIS — E118 Type 2 diabetes mellitus with unspecified complications: Secondary | ICD-10-CM

## 2021-11-11 ENCOUNTER — Encounter: Payer: Self-pay | Admitting: Internal Medicine

## 2021-11-11 ENCOUNTER — Other Ambulatory Visit: Payer: Self-pay

## 2021-11-11 ENCOUNTER — Ambulatory Visit (INDEPENDENT_AMBULATORY_CARE_PROVIDER_SITE_OTHER): Payer: Medicare Other | Admitting: Internal Medicine

## 2021-11-11 ENCOUNTER — Ambulatory Visit (HOSPITAL_BASED_OUTPATIENT_CLINIC_OR_DEPARTMENT_OTHER)
Admission: RE | Admit: 2021-11-11 | Discharge: 2021-11-11 | Disposition: A | Discharge status: Home / Self Care | Payer: Medicare Other | Source: Ambulatory Visit | Attending: Internal Medicine | Admitting: Internal Medicine

## 2021-11-11 VITALS — BP 136/78 | HR 101 | Temp 98.0°F | Resp 18 | Ht 69.0 in | Wt 191.1 lb

## 2021-11-11 DIAGNOSIS — I10 Essential (primary) hypertension: Secondary | ICD-10-CM

## 2021-11-11 DIAGNOSIS — E119 Type 2 diabetes mellitus without complications: Secondary | ICD-10-CM

## 2021-11-11 DIAGNOSIS — E785 Hyperlipidemia, unspecified: Secondary | ICD-10-CM

## 2021-11-11 DIAGNOSIS — J441 Chronic obstructive pulmonary disease with (acute) exacerbation: Secondary | ICD-10-CM

## 2021-11-11 LAB — LIPID PANEL
Cholesterol: 141 mg/dL (ref 0–200)
HDL: 41.1 mg/dL (ref >39.00)
LDL Cholesterol: 70 mg/dL (ref 0–99)
NonHDL: 99.75
Total CHOL/HDL Ratio: 3
Triglycerides: 148 mg/dL (ref 0.0–149.0)
VLDL: 29.6 mg/dL (ref 0.0–40.0)

## 2021-11-11 LAB — HEMOGLOBIN A1C: Hgb A1c MFr Bld: 5.8 % (ref 4.6–6.5)

## 2021-11-11 NOTE — Patient Instructions (Addendum)
Per our records you are due for your diabetic eye exam. Please contact your eye doctor to schedule an appointment. Please have them send copies of your office visit notes to [place]. Our fax number is [phone]. If you need a referral to an eye doctor please let us know.  GO TO THE LAB : Get the blood work     GO TO THE FRONT DESK, PLEASE SCHEDULE YOUR APPOINTMENTS Come back for   a checkup in 4 months  STOP BY THE FIRST FLOOR:  get the XR

## 2021-11-11 NOTE — Assessment & Plan Note (Addendum)
DM: Patient has been on Januvia for a while, started Farxiga November 2022  (for CHF).  Last A1c very good.  Recheck A1c, consider stop Januvia. High cholesterol: On atorvastatin, check FLP. PAD, A-fib, CHF: Saw cardiology 09/17/2021, felt to be stable, not anticoagulated due to previous intracranial bleed. Weight was up, Lasix increase to 80 mg 40 mg.  Subsequent BMP was satisfactory. Heart rate is slightly up today, typically okay.  Observation. COPD exacerbation: On Pulmicort twice daily and other meds prn.  Feeling well respiratory wise.  Recheck a chest x-ray >> see last report. Up-to-date on flu and COVID-vaccine RTC 4 months

## 2021-11-11 NOTE — Progress Notes (Signed)
Subjective:    Patient ID: Bradley Sit., male    DOB: 07/26/1941, 81 y.o.   MRN: [id number]  DOS:  11/11/2021 Type of visit - description: Follow-up  Since the last office visit saw cardiology, note reviewed. He is doing well, good med compliance.   Denies fever chills No chest pain or difficulty breathing No edema. No palpitations  Review of Systems See above   Past Medical History:  Diagnosis Date   Abnormal CT scan, chest 2012   CT chest, several lymphadenopathies. Sees pulmonary   Anemia    intermittent   Arthritis    "?back" (09/19/2018)   Atrial fibrillation (HCC)    s/p AV node ablation & BiV PPM implantation 09/08/11 (op dictation pending)   BPH (benign prostatic hyperplasia)    Saw Dr [name] 2004, normal renal u/s   Bronchitis 08/26/2017   CAD (coronary artery disease)    CHF (congestive heart failure) (HCC)    Thought primarily to be non-systolic although EF down (EF 01-60% 12/2010, down to 35-40% 09/05/11), cath 2008 with no CAD, nuclear study 07/2011 showing Small area of reversibility in the distal ant/lat wall the left ventricle suspicious for ischemia/septal wall HK but felt to be low risk  (per D/C Summary 07/2011)   Chronic bronchitis (HCC)    "get it ~ q yr"   Chronic respiratory failure with hypoxia and hypercapnia (HCC) 09/14/2010   Followed in Pulmonary clinic/ [facility]/ [facility]  Started on 02 2lpm at discharge 09/23/11   - PFT's 10/28/2011  FEV1  1.40 (51%)  with ratio 70 and no better p B2 and DLCO 53 corrects to 101    - PFT's 10/18/2014    FEV1 1.71 (59%) with ratio 68 and no sign change p B2 and DLCO 53 corrects to 85  -  HC03   07/28/20  = 33  -  HCO3   03/17/21     = 31     Heart murmur    HLD (hyperlipidemia)    HTN (hypertension)    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   Pleural effusion 2008   S/p decortication   Pneumonia 08/02/2011   Pulmonary HTN (HCC)    per cath 2008   Type II diabetes mellitus (HCC) 1999    Past Surgical History:  Procedure Laterality Date   BI-VENTRICULAR PACEMAKER INSERTION Left 09/08/2011   Procedure: BI-VENTRICULAR PACEMAKER INSERTION (CRT-P);  Surgeon: Marinus Maw, MD;  Location: [facility] CATH LAB;  Service: Cardiovascular;  Laterality: Left;   CARDIAC CATHETERIZATION     "couple times; never had balloon or stent" (09/19/2018)   CATARACT EXTRACTION W/ INTRAOCULAR LENS  IMPLANT, BILATERAL Bilateral 08/2018   COLONOSCOPY  03/10/11   normal   INSERT / REPLACE / REMOVE PACEMAKER  09/08/11   pacemaker placement   LUNG DECORTICATION     PLEURAL SCARIFICATION     pneumothorax with fibrothorax  ~ 2010   TONSILLECTOMY     "as a kid"     Current Outpatient Medications  Medication Instructions   acetaminophen (TYLENOL) 500 mg, Oral, Every 6 hours PRN   albuterol (VENTOLIN HFA) 108 (90 Base) MCG/ACT inhaler 2 puffs, Inhalation, Every 6 hours PRN   amLODipine (NORVASC) 10 mg,  Oral, Daily   aspirin 81 mg, Oral, Every morning   atorvastatin (LIPITOR) 20 MG tablet TAKE 1 TABLET AT BEDTIME   budesonide (PULMICORT) 0.5 mg, Nebulization, 2 times daily   calcium-vitamin D (OSCAL WITH D) 500-200 MG-UNIT tablet 1 tablet, Oral, Every morning   carvedilol (COREG) 25 MG tablet TAKE 1 TABLET TWICE A DAY WITH MEALS   cholecalciferol (VITAMIN D3) 1,000 Units, Oral, Daily   dapagliflozin propanediol (FARXIGA) 10 mg, Oral, Daily before breakfast   diphenhydramine-acetaminophen (TYLENOL PM) 25-500 MG TABS tablet 1 tablet, Oral, At bedtime PRN   furosemide (LASIX) 40 MG tablet TAKE 2 TABLETS EVERY MORNING AND 1 TABLET IN THE LATE AFTERNOON   ipratropium-albuterol (DUONEB) 0.5-2.5 (3) MG/3ML SOLN 3 mLs, Nebulization, 2 times daily PRN   Lancets (ONETOUCH ULTRASOFT) lancets Check blood sugars no more than twice daily    loratadine (CLARITIN) 10 mg, Oral, Daily   losartan (COZAAR) 50 MG tablet TAKE 1 TABLET DAILY   Multiple Vitamins-Minerals (MULTIVITAMINS THER. W/MINERALS) TABS tablet 1 tablet, Oral, Daily   nitroGLYCERIN (NITROSTAT) 0.4 mg, Sublingual, Every 5 min x3 PRN   ONETOUCH VERIO test strip USE TO CHECK BLOOD SUGAR NO MORE THAN TWICE A DAY   pantoprazole (PROTONIX) 40 mg, Oral, Daily before breakfast   sitaGLIPtin (JANUVIA) 100 mg, Oral, Daily   tamsulosin (FLOMAX) 0.4 MG CAPS capsule TAKE 1 CAPSULE DAILY AFTER SUPPER   zolpidem (AMBIEN) 10 mg, Oral, Daily at bedtime       Objective:   Physical Exam BP 136/78 (BP Location: Left Arm, Patient Position: Sitting, Cuff Size: Small)    Pulse (!) 101    Temp 98 F (36.7 C) (Oral)    Resp 18    Ht 5\' 9"  (1.753 m)    Wt 191 lb 2 oz (86.7 kg)    SpO2 91%    BMI 28.22 kg/m    General:   Well developed, NAD, BMI noted. HEENT:  Normocephalic . Face symmetric, atraumatic Lungs:  Decreased breath sounds Normal respiratory effort, no intercostal retractions, no accessory muscle use. Heart: Irregularly irregular Lower extremities: no pretibial edema bilaterally  Skin: Not pale. Not jaundice Neurologic:  alert & oriented X3.  Speech normal, gait appropriate for age and unassisted Psych--  Cognition and judgment appear intact.  Cooperative with normal attention span and concentration.  Behavior appropriate. No anxious or depressed appearing.      Assessment     Assessment  DM HTN Hyperlipidemia GERD Anxiety- insomnia  BPH Cardiovascular:Dr  --  Cath x 2 Ladona Ridgel: no significant CAD -- CHF, non-ischemic  --Atrial fibrillation; h/o AV node ablation, has a Pacemaker --h/o IC bleeding: Not anticoagulated --Peripheral vascular disease Pulmonary: [place] [id number], last OV 10-18-2014, f/u prn --COPD, severe, chronic resp failure, night O2 prn, has portable O2  --Pleural effusion s/p decortication --Pulmonary hypertension  Stasis dermatitis    PLAN: DM: Patient has been on Januvia for a while, started Farxiga November 2022  (for CHF).  Last A1c very good.  Recheck A1c, consider stop Januvia. High cholesterol: On atorvastatin, check FLP. PAD, A-fib, CHF: Saw cardiology 09/17/2021, felt to be stable, not anticoagulated due to previous intracranial bleed. Weight was up, Lasix increase to 80 mg 40 mg.  Subsequent BMP was satisfactory. Heart rate is slightly up today, typically okay.  Observation. COPD exacerbation: On Pulmicort twice daily and other meds prn.  Feeling well respiratory wise.  Recheck a chest x-ray >> see last report. Up-to-date on flu and COVID-vaccine RTC 4 months  This visit occurred during the SARS-CoV-2 public health emergency.  Safety protocols were in place, including screening questions prior to the visit, additional usage of staff PPE, and extensive cleaning of exam room while observing appropriate contact time as indicated for disinfecting solutions.

## 2021-11-12 ENCOUNTER — Telehealth: Payer: Self-pay | Admitting: Internal Medicine

## 2021-11-12 NOTE — Addendum Note (Signed)
Addended byDamita Dunnings D on: 11/12/2021 08:39 AM   Modules accepted: Orders

## 2021-11-12 NOTE — Telephone Encounter (Signed)
Spoke w/ Pt- informed to d/c Januvia. Pt verbalized understanding.

## 2021-11-12 NOTE — Telephone Encounter (Signed)
Patient would like for cma to contact him to go over meds paz wants him to stop.

## 2021-11-18 ENCOUNTER — Other Ambulatory Visit: Payer: Self-pay | Admitting: Internal Medicine

## 2021-11-19 ENCOUNTER — Ambulatory Visit (INDEPENDENT_AMBULATORY_CARE_PROVIDER_SITE_OTHER): Payer: BLUE CROSS/BLUE SHIELD | Admitting: Pharmacist

## 2021-11-19 DIAGNOSIS — E118 Type 2 diabetes mellitus with unspecified complications: Secondary | ICD-10-CM

## 2021-11-19 DIAGNOSIS — I5032 Chronic diastolic (congestive) heart failure: Secondary | ICD-10-CM

## 2021-11-19 NOTE — Chronic Care Management (AMB) (Signed)
/   Chronic Care Management Pharmacy Note  11/19/2021 Name:  Bradley OLDAKER Sr. MRN:  [id number] DOB:  25-Feb-1941  Summary:  Patient report breathing better and weight has been stable since furosemide dose increase 09/17/2021 by Dr Oval Linsey. He is taking furosemide 12m each morning and 417meach evening. Saw Dr PaLarose Kells2/10/2021. A1c was 5.8% so Januvia was discontinued - he continues to take Farxiga 1022maily for DM and HFpEF. Started 09/08/2021.  Home blood glucose has still been in 100 to 140 range after discontinuation of Januvia.  Follow up: phone visit in 90 days   Subjective: Bradley [name]. is an [age]. year old male who is a primary patient of Paz, JosAlda BertholdD.  The CCM team was consulted for assistance with disease management and care coordination needs.    Engaged with patient by telephone for follow up visit in response to provider referral for pharmacy case management and/or care coordination services.   Consent to Services:  The patient was given information about Chronic Care Management services, agreed to services, and gave verbal consent prior to initiation of services.  Please see initial visit note for detailed documentation.   Patient Care Team: PazColon BranchD as PCP - General (Internal Medicine) RanSkeet LatchD as PCP - Cardiology (Cardiology) TayEvans LanceD as Consulting Physician (Cardiology) WerTanda RockersD as Consulting Physician (Pulmonary Disease) GorRuby ColaD as Consulting Physician (Otolaryngology) PouGlasgowitBenjaman PottD (Optometry) EckCherre RobinsPH-CPP (Pharmacist) WalLuretha RuedN as Case Manager  Recent office visits: 11/11/2021 - Int Med (Dr PazLarose Kellsollow up chronic conditions. A1c was 5.8% - Januvia discontinued. Continued Farxiga. 09/14/2021 - Int Med ([facility]. No med changes noted. Labs ordered ionized calcium, LFTs and CBC. Ordered chest xray. 08/11/2021 - [facility]. No medication  changes noted.  04/10/2021 - PCP (Dr PazLarose Kellsecheck type 2 DM and COPD. No med changes. Noted patient has completed prednisone prescribed at discharge    Recent consult visits: 09/17/2021 - Cardio (Dr RanLucretia Fieldollow up CHF / SOB. Increased furosemide 40m67m take 80mg58mh morning and 40mg 68m afternoon. Check BMP and BNP in 1 week.  09/07/2021 - Cardio (Clegg,Ninfa MeekerSeen for HFpEF. Started Farxiag 10mg d87m at breakfast.  06/03/21 Cardio (Dr RandolpOval Linsey for general follow up. EKG completed. Follow up in 6 months.   Hospital visits: 08/29/2021 to [facility]02[facility] Medications started at discharge: predinsone 40mg da85mfor 2 days; doxycyline 100mg twi46m day for 2 days.  07/29/2021 to 08/03/20[facility] Treated with steroids for COPD exacerbations and IV diuretics for CHF.    Medications started at discharge: prednisone 50mg dail88mth breakfast and loratidine 10mg daily28mxycycline 100mg twice 50my; guiafenesin twice a day; losartan 50mg take 0.53mblet = 25mg daily  M109mations stopped at discharge: Tylenol PM, Medication changes at discharge: amlodipine dose lowered to 5mg daily; Fur61mmide dose increased to take 80mg daily exce57mn MWF take 40mg in am and 254min afternoon87mObjective:  Lab Results  Component Value Date   CREATININE 0.87 09/25/2021   CREATININE 0.83 09/07/2021   CREATININE 0.74 09/02/2021    Lab Results  Component Value Date   HGBA1C 5.8 11/11/2021   Last diabetic Eye exam:  Lab Results  Component Value Date/Time   HMDIABEYEEXA No Retinopathy 05/06/2020 12:00 AM  Last diabetic Foot exam:  Lab Results  Component Value Date/Time   HMDIABFOOTEX Done 07/15/2015 12:00 AM        Component Value Date/Time   CHOL 141 11/11/2021 0928   TRIG 148.0 11/11/2021 0928   HDL 41.10 11/11/2021 0928   CHOLHDL 3 11/11/2021 0928   VLDL 29.6 11/11/2021  0928   LDLCALC 70 11/11/2021 0928    Hepatic Function Latest Ref Rng & Units 09/14/2021 07/30/2021 07/29/2021  Total Protein 6.0 - 8.3 g/dL 5.9(L) 6.7 6.9  Albumin 3.5 - 5.2 g/dL 3.5 3.2(L) 3.4(L)  AST 0 - 37 U/L _0 ALT 0 - 53 U/L _1 Alk Phosphatase 39 - 117 U/L 65 89 90  Total Bilirubin 0.2 - 1.2 mg/dL 0.5 0.7 1.0  Bilirubin, Direct 0.0 - 0.3 mg/dL 0.1 - -    Lab Results  Component Value Date/Time   TSH 1.61 06/07/2018 02:13 PM   TSH 0.300 (L) 05/25/2018 04:46 PM   TSH 1.76 11/01/2017 11:08 AM    CBC Latest Ref Rng & Units 09/14/2021 09/01/2021 08/31/2021  WBC 4.0 - 10.5 K/uL 7.3 8.1 8.3  Hemoglobin 13.0 - 17.0 g/dL 12.3(L) 11.9(L) 11.6(L)  Hematocrit 39.0 - 52.0 % 36.8(L) 34.9(L) 34.5(L)  Platelets 150.0 - 400.0 K/uL 172.0 228 235    No results found for: VD25OH  Clinical ASCVD: Yes  The ASCVD Risk score (Arnett DK, et al., 2019) failed to calculate for the following reasons:   The 2019 ASCVD risk score is only valid for ages 37 to 56     Social History   Tobacco Use  Smoking Status Former   Packs/day: 2.50   Years: 25.00   Pack years: 62.50   Types: Cigarettes, Cigars   Quit date: 10/11/1980   Years since quitting: 41.1  Smokeless Tobacco Never   BP Readings from Last 3 Encounters:  11/11/21 136/78  09/17/21 112/74  09/14/21 126/72   Pulse Readings from Last 3 Encounters:  11/11/21 (!) 101  09/17/21 87  09/14/21 69   Wt Readings from Last 3 Encounters:  11/11/21 191 lb 2 oz (86.7 kg)  09/17/21 189 lb 12.8 oz (86.1 kg)  09/14/21 190 lb 6 oz (86.4 kg)    Assessment: Review of patient past medical history, allergies, medications, health status, including review of consultants reports, laboratory and other test data, was performed as part of comprehensive evaluation and provision of chronic care management services.   SDOH:  (Social Determinants of Health) assessments and interventions performed:  SDOH Interventions    Flowsheet Row Most  Recent Value  SDOH Interventions   Financial Strain Interventions Intervention Not Indicated  Transportation Interventions Intervention Not Indicated       CCM Care Plan  Allergies  Allergen Reactions   Hydrocodone Other (See Comments)    "given to him in the hospital; went thru withdrawals once home; dr said not to take it again" (09/27/2012)   Tramadol Other (See Comments)    "given to him in the hospital; went thru withdrawals once home; dr said not to take it again" (09/27/2012)   Tadalafil Other (See Comments)    headache, backache   Avelox [Moxifloxacin Hydrochloride] Itching and Other (See Comments)    Headache    Medications Reviewed Today     Reviewed by Cherre Robins, RPH-CPP (Pharmacist) on 11/19/21 at [place] List Status: <None>   Medication Order Taking? Sig Documenting Provider Last Dose Status Informant  acetaminophen (TYLENOL) 500 MG tablet [id number]  Yes Take 500 mg by mouth every 6 (six) hours as needed for headache or moderate pain. [provider] Taking Active Self  albuterol (VENTOLIN HFA) 108 (90 Base) MCG/ACT inhaler [id number] No Inhale 2 puffs into the lungs every 6 (six) hours as needed for wheezing or shortness of breath.  Patient not taking: Reported on 11/19/2021   Jonetta Osgood, MD Not Taking Active Self  amLODipine (NORVASC) 10 MG tablet [id number] Yes Take 10 mg by mouth daily. [provider] Taking Active   aspirin 81 MG tablet [id number] Yes Take 81 mg by mouth every morning.  [provider] Taking Active Self  atorvastatin (LIPITOR) 20 MG tablet [id number] Yes TAKE 1 TABLET AT BEDTIME Colon Branch, MD Taking Active Self  budesonide (PULMICORT) 0.5 MG/2ML nebulizer solution [id number] Yes Take 2 mLs (0.5 mg total) by nebulization 2 (two) times daily. Colon Branch, MD Taking Active Self  calcium-vitamin D (OSCAL WITH D) 500-200 MG-UNIT tablet [id number] Yes Take 1 tablet by mouth every morning. [provider]  Taking Active Self  carvedilol (COREG) 25 MG tablet [id number] Yes TAKE 1 TABLET TWICE A DAY WITH MEALS Paz, Alda Berthold, MD Taking Active   cholecalciferol (VITAMIN D3) 25 MCG (1000 UNIT) tablet [id number] Yes Take 1,000 Units by mouth daily. [provider] Taking Active Self  dapagliflozin propanediol (FARXIGA) 10 MG TABS tablet [id number] Yes Take 1 tablet (10 mg total) by mouth daily before breakfast. Colon Branch, MD Taking Active   diphenhydramine-acetaminophen (TYLENOL PM) 25-500 MG TABS tablet [id number] Yes Take 1 tablet by mouth at bedtime as needed. [provider] Taking Active Self           Med Note Herb Grays Sep 07, 2021 12:08 PM)    furosemide (LASIX) 40 MG tablet [id number] Yes TAKE 2 TABLETS EVERY MORNING AND 1 TABLET IN THE LATE AFTERNOON Skeet Latch, MD Taking Active   ipratropium-albuterol (DUONEB) 0.5-2.5 (3) MG/3ML SOLN [id number] No Take 3 mLs by nebulization 2 (two) times daily as needed.  Patient not taking: Reported on 11/11/2021   Colon Branch, MD Not Taking Active Self  Lancets Glory Rosebush ULTRASOFT) lancets [id number] Yes Check blood sugars no more than twice daily Colon Branch, MD Taking Active Self  loratadine (CLARITIN) 10 MG tablet [id number] Yes Take 1 tablet (10 mg total) by mouth daily. Geradine Girt, DO Taking Active Self  losartan (COZAAR) 50 MG tablet [id number] Yes TAKE 1 TABLET DAILY Colon Branch, MD Taking Active   Multiple Vitamins-Minerals (MULTIVITAMINS THER. W/MINERALS) TABS tablet [id number] Yes Take 1 tablet by mouth daily. Colon Branch, MD Taking Active Self  nitroGLYCERIN (NITROSTAT) 0.4 MG SL tablet [id number] No Place 1 tablet (0.4 mg total) under the tongue every 5 (five) minutes x 3 doses as needed for chest pain.  Patient not taking: Reported on 11/11/2021   Colon Branch, MD Not Taking Active            Med Note Alinda Money, [place] Sep 14, 2021  2:33 PM) PRN  Roma Schanz test strip [id number] Yes USE TO CHECK BLOOD SUGAR NO  MORE THAN TWICE A DAY Paz, Alda Berthold, MD Taking Active Self  pantoprazole (PROTONIX) 40 MG tablet [id number] Yes Take 1 tablet (40 mg total) by mouth daily before breakfast. Colon Branch, MD Taking Active   tamsulosin (FLOMAX) 0.4 MG CAPS capsule [id number] Yes TAKE 1 CAPSULE DAILY AFTER SUPPER Colon Branch,  MD Taking Active Self  zolpidem (AMBIEN) 10 MG tablet [id number] No Take 10 mg by mouth at bedtime.  Patient not taking: Reported on 11/19/2021   [provider] Not Taking Active Self            Patient Active Problem List   Diagnosis Date Noted   GERD without esophagitis 07/30/2021   Cor pulmonale (chronic) ([place]) 08/05/2020   COPD exacerbation ([place]) 12/22/2018   COPD GOLD 0 spirometry/ AB  12/22/2018   PCP NOTES >>> 07/15/2015   Annual physical exam 05/08/2012   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   Sinoatrial node dysfunction ([place])    Non-ischemic cardiomyopathy ([place]) 09/06/2011   Acute on chronic diastolic CHF (congestive heart failure) ([place])    PAD (peripheral artery disease) ([place]) 10/22/2010   DERMATITIS, STASIS 09/14/2010   Chronic respiratory failure with hypoxia and hypercapnia ([place]) 09/14/2010   Chronic pulmonary hypertension secondary to elevated L H pressures  04/15/2009   ERECTILE DYSFUNCTION 01/06/2009   Mixed diabetic hyperlipidemia associated with type 2 diabetes mellitus ([place]) 12/13/2007   Essential hypertension 12/13/2007   Anxiety--insomnia 08/14/2007   Type 2 diabetes mellitus without complication, without long-term current use of insulin ([place]) 01/02/2007   Chronic atrial fibrillation ([place]) 01/02/2007   Benign enlargement of prostate 01/02/2007    Immunization History  Administered Date(s) Administered   Fluad Quad(high Dose 65+) 06/26/2019, 08/11/2020   Influenza Split 10/01/2011   Influenza Whole 08/14/2007, 07/11/2009, 09/14/2010   Influenza, High Dose Seasonal PF 09/14/2013, 07/15/2015, 07/09/2016, 07/05/2017, 08/03/2018   Influenza,inj,Quad PF,6+ Mos  07/05/2014   Influenza-Unspecified 08/11/2012, 08/11/2021   Janssen (J&J) SARS-COV-2 Vaccination 01/12/2020, 09/19/2020   PFIZER Comirnaty([place])Covid-19 Tri-Sucrose Vaccine 03/18/2021   Pfizer Covid-19 Vaccine Bivalent Booster 85yr & up 08/11/2021   Pneumococcal Conjugate-13 03/12/2015   Pneumococcal Polysaccharide-23 07/11/2004, 03/30/2009, 11/01/2017, 05/11/2019   Td 03/12/2003   Tetanus 09/14/2013   Zoster, Live 04/20/2011    Conditions to be addressed/monitored: Atrial Fibrillation, CHF, HTN, HLD, COPD, DMII, and enlarged prostate, PVD, GERD  Care Plan : General Pharmacy (Adult)  Updates made by ECherre Robins RPH-CPP since 11/19/2021 12:00 AM     Problem: Hypertension, Hyperlipidemia/PAD/Cardiomyopathy, Diabetes, Afib, COPD   Priority: High  Onset Date: 12/15/2020     Long-Range Goal: Provide education, support and care coordination for medication therapy and chronic conditions   Start Date: 12/15/2020  Expected End Date: 12/18/2021  Recent Progress: On track  Priority: High  Note:   Current Barriers:  Education needed regarding CHF and prevention of exacerbations Assist with medication adherence and education  Pharmacist Clinical Goal(s):  Over the next 120 days, patient will achieve adherence to monitoring guidelines and medication adherence to achieve therapeutic efficacy maintain control of blood pressure and blood glucose as evidenced by home monitoring  adhere to prescribed medication regimen as evidenced by fill dates through collaboration with PharmD and provider.   Interventions: 1:1 collaboration with PColon Branch MD regarding development and update of comprehensive plan of care as evidenced by provider attestation and co-signature Inter-disciplinary care team collaboration (see longitudinal plan of care) Comprehensive medication review performed; medication list updated in electronic medical record  Hypertension / HFpEF (BP goal <130/80 and prevent CHF  exacerbations and hospitalizations) Blood pressure at goal; had 3 CHF / COPD exacerbations in 2022  Current treatment: Amlodipine 168mdaily  Losartan 50 mg daily Carvedilol 2543mwice daily Farxiga 90m78mily (Added 09/08/2021) Furosemide 40mg34make 2 tablets every morning and 1 tablet each afternoon (dose increased 09/17/2021) Medications previously  tried: none noted Current blood pressure home reading: 135/68 Home weights - patient is checking daily Reports weight only varies by 1 or 2 lbs (did not have weight records to tell exact numbers today) Denies hypotensive/hypertensive symptoms Patient denies lower extremity edema; Denies shortness of breath at rest or at night when lying down, denies nausea or abdominal fullness Interventions:  Reviewed signs and symptoms of CHF exacerbation - weight gain, SOB, abdominal fullness, swelling in legs or abdomen, Fatigue and weakness, changes in ability to perform usual activities, persistent cough or wheezing with white or pink blood-tinged mucus, nausea and lack of appetite Continue to weigh daily - report weight gain of more than 3 lbs in 24 hours or 5 lbs in 1 week. Reveiwed the importance of letting either PCP or cardiologist know about weight changes to avoid exacerbations and hospitalizaiton Educated on blood pressure goals and benefits of medications for prevention of heart attack, stroke and kidney damage; Counseled to monitor BP at home 1 or 2 times per week, document, and provide log at future appointments Recommended to continue current medications for blood pressure and HFpEF  Hyperlipidemia: (LDL goal < 70) / PAD Controlled Current treatment: Atorvastatin 65m daily Aspirin 8325mdaily Nitroglycerin 0.25m325ms needed (last needed over 1 year ago) Medications previously tried: none noted  Interventions: (addressed at previous visit) Reviewed most recent lipid panel, LDL now at goal.   Reviewed cholesterol goals;  Reviewed benefits of  statin for ASCVD risk reduction  Recommended to continue current medication for cholesterol lowering  Diabetes (A1c goal <7%) Controlled; last A1c was 5.9% Current medications: Farxiga 65m23mily  Patient endorses that FraxSamule Ohmaffordable - has Medicare and insurance with previous employer Rx coverage.  90 day supply = $11. Medications previously tried: metformin (heart failure?), glyburide (D/C to deprescribe per low a1c); Januvia (stopped when farxiga started - de-prescribing) Current home glucose readings Ranges from 100 to 147 Denies BG <80 Denies hypoglycemic/hyperglycemic symptoms Current meal patterns: states his wife and granddaughter make sure he "eats right"  Eye exam completed within the last year. Interventions:  Educated on A1c and blood sugar goals Continue current therapy for diabetes Reviewed home blood glucose readings and reviewed goals  Fasting blood glucose goal (before meals) = 80 to 130 Blood glucose goal after a meal = less than 180  Counseled to check feet daily and get yearly eye exams Recommended to continue current medication  Atrial Fibrillation (Goal: prevent stroke and major bleeding) Controlled Current treatment: Rate control: Carvedilol 25mg67mce daily Anticoagulation: Aspirin 81mg 39my Medications previously tried: coumadin (intracranial bleed in 2013) Interventions:  Reviewed signs and symptoms for bleeding to monitor for regarding aspirin Recommended to continue current medication and follow up with cardiologist.   COPD (Goal: control symptoms and prevent exacerbations) Controlled Managed by pulmonology - Dr Wert (Melvyn Novas appt 02/2021) Current treatment  Pulmicort (Budesonide) 0.5mg/45m11mebulized twice daily DuoNeb (Ipratropium/Albuterol) 0.5/2.5/3mL neb29mzed twice daily as needed Albuterol HFA - inhale 1 or 2 puff every 4 to 6 hours as needed for shortness of breath or wheezing Supplement oxygen - 2 to 3 L to keep O2 sats  >90% Medications previously tried: none noted  Gold Grade: Gold 0 per DR Wert's last note 03/2021 Current COPD Classification:  C (low sx, >2 exacerbations/yr) Has had 3 Hospitalizations in last year for COPD / CHF Interventions:  Reviewed medication regimen for COPD Reviewed O2 sat measurement; continue to check daily Recommended to continue current medication  Health Maintenance:  Reviewed  vaccination history and discussed benefits of Shingrix vaccination Patient considering the following vaccines at next provider appointment, local pharmacy or [place]: Shingrix vaccine  Patient Goals/Self-Care Activities Over the next 120 days, patient will:  take medications as prescribed check blood pressure daily, document, and provide at future appointments Check weight daily and record for future appointments.  Report signs and symptoms of CHF exacerbation - weight gain, shortness of breath, abdominal fullness, swelling in legs or abdomen, Fatigue and weakness, changes in ability to perform usual activities, persistent cough or wheezing with white or pink blood-tinged mucus, nausea and lack of appetite Continue to weigh daily - report weight gain of more than 3 lbs in 24 hours or 5 lbs in 1 week. target a minimum of 150 minutes of moderate intensity exercise weekly  Follow Up Plan: The care management team will reach out to the patient again over the next 60 to 90 days     Medication Assistance: None required.  Patient affirms current coverage meets needs.  Patient's preferred pharmacy is:  [place], [place] [address] [place] [address] Phone: [phone] Fax: [id number] - [place], [place] - [address] AT [place] [address] [place] [address] Phone: [phone] Fax: [phone]   Follow Up:  Patient agrees to Care Plan  and Follow-up.  Plan: Telephone follow up appointment with care management team member scheduled for:  March 2023 with care management nurse; May 2023 with clinical pharmacist  Cherre Robins, PharmD Clinical Pharmacist [facility] [employer]

## 2021-11-19 NOTE — Patient Instructions (Signed)
[name] It was a pleasure speaking with you today.  I have attached a summary of our visit today and information about your health goals (see below)   If you have any questions or concerns, please feel free to contact me either at the phone number below or with a MyChart message.   Keep up the good work!  Cherre Robins, PharmD Clinical Pharmacist [facility] [phone] (direct line)  [phone] (main office number)  CARE PLAN ENTRY (Updated 11/19/2021)   Hypertension / Heart Failure BP Readings from Last 3 Encounters:  11/11/21 136/78  09/17/21 112/74  09/14/21 126/72   Pharmacist Clinical Goal(s): Over the next 120 days, patient will work with PharmD and providers to maintain BP goal <130/80 Current regimen:  Amlodipine 10mg  daily  Losartan 50 take 1 tablet daily   Carvedilol 25mg  twice daily Farxiga 10mg  daily (beneficial for heart and blood glucose / diabetes) Interventions: Recommended he continue to check his blood pressure 2 to 3 time per week Patient self care activities - Over the next 120 days, patient will: Check blood pressure 2 to 3 times per week, document, and provide at future appointments Ensure daily salt intake < 2300 mg/day Continue current medications to lower blood pressure and heart  Check weight daily and record for future appointments.  Report signs and symptoms of CHF exacerbation - weight gain, shortness of breath, abdominal fullness, swelling in legs or abdomen, Fatigue and weakness, changes in ability to perform usual activities, persistent cough or wheezing with white or pink blood-tinged mucus, nausea and lack of appetite Continue to weigh daily and record for future appointments  Hyperlipidemia/PAD Lab Results  Component Value Date/Time   LDLCALC 70 11/11/2021 09:28 AM   Pharmacist Clinical Goal(s): Over the next 120 days, patient will work with PharmD and providers to maintain LDL goal < 70 Current  regimen:  Atorvastatin 20mg  daily Aspirin 81mg  daily Nitroglycerin 0.4mg  as needed Patient self care activities - Over the next 120 days, patient will: Maintain cholesterol medication regimen. Be physically active. Continue walking to mailbox as able. Work up to longer walks if able. Goal is to get at least 150 minutes of physical activity per week   Diabetes Lab Results  Component Value Date/Time   HGBA1C 5.8 11/11/2021 09:28 AM   HGBA1C 5.9 (H) 07/30/2021 04:53 AM   Pharmacist Clinical Goal(s): Over the next 120 days, patient will work with PharmD and providers to maintain A1c goal <7% Current regimen:  [place] 10mg  daily Interventions: Discussed a1c goal  Patient self care activities - Over the next 90 days, patient will: Check blood sugar 2 to 3 times per week, document, and provide at future appointments Reviewed home blood glucose readings and reviewed goals  Fasting blood glucose goal (before meals) = 80 to 130 Blood glucose goal after a meal = less than 180  Contact provider with any episodes of hypoglycemia  /blood glucose < 80  GERD / acid reflux Pharmacist Clinical Goal(s) Over the next 120 days, patient will work with PharmD and providers to reduce symptoms of GERD and reduce polypharmacy Current regimen:  Pantoprazole 40mg  daily Interventions: Discussed the risk/benefit of continuation vs discontinuation of long term pantoprazole use  Patient self care activities - Over the next 120 days, patient will: Maintain current regimen for GERD / acid reflux  Medication management Pharmacist Clinical Goal(s): Over the next 120 days, patient will work with PharmD and providers to maintain optimal medication adherence Current pharmacy: [facility] Interventions Comprehensive medication review performed. Continue current medication management strategy Reviewed refill history and assess adherence - patient has filled medications on time over the  last 3 months.   Patient Goals/Self-Care Activities Over the next 120 days, patient will:  take medications as prescribed check blood pressure daily, document, and provide at future appointments Check weight daily and record for future appointments.  Report signs and symptoms of CHF exacerbation - weight gain, shortness of breath, abdominal fullness, swelling in legs or abdomen, Fatigue and weakness, changes in ability to perform usual activities, persistent cough or wheezing with white or pink blood-tinged mucus, nausea and lack of appetite Continue to weigh daily - report weight gain of more than 3 lbs in 24 hours or 5 lbs in 1 week. target a minimum of 150 minutes of moderate intensity exercise weekly  Follow Up Plan: The care management team will reach out to the patient again over the next 60-90days  Patient verbalizes understanding of instructions and care plan provided today and agrees to view in [place]. Active MyChart status confirmed with patient.

## 2021-11-24 ENCOUNTER — Encounter: Payer: Self-pay | Admitting: Internal Medicine

## 2021-11-24 ENCOUNTER — Ambulatory Visit (INDEPENDENT_AMBULATORY_CARE_PROVIDER_SITE_OTHER): Payer: Medicare Other

## 2021-11-24 ENCOUNTER — Telehealth: Payer: Self-pay | Admitting: Internal Medicine

## 2021-11-24 DIAGNOSIS — I428 Other cardiomyopathies: Secondary | ICD-10-CM

## 2021-11-24 LAB — CUP PACEART REMOTE DEVICE CHECK
Battery Remaining Longevity: 7 mo
Battery Voltage: 2.85 V
Brady Statistic AP VP Percent: 0 %
Brady Statistic AP VS Percent: 0 %
Brady Statistic AS VP Percent: 98.86 %
Brady Statistic AS VS Percent: 1.14 %
Brady Statistic RA Percent Paced: 0 %
Brady Statistic RV Percent Paced: 99.67 %
Date Time Interrogation Session: 20230214113620
Implantable Lead Implant Date: 20121128
Implantable Lead Implant Date: 20121128
Implantable Lead Location: 753858
Implantable Lead Location: 753860
Implantable Lead Model: 4194
Implantable Lead Model: 5076
Implantable Pulse Generator Implant Date: 20121128
Lead Channel Impedance Value: 361 Ohm
Lead Channel Impedance Value: 399 Ohm
Lead Channel Impedance Value: 4047 Ohm
Lead Channel Impedance Value: 4047 Ohm
Lead Channel Impedance Value: 437 Ohm
Lead Channel Impedance Value: 437 Ohm
Lead Channel Impedance Value: 475 Ohm
Lead Channel Impedance Value: 551 Ohm
Lead Channel Impedance Value: 665 Ohm
Lead Channel Pacing Threshold Amplitude: 0.625 V
Lead Channel Pacing Threshold Amplitude: 1.375 V
Lead Channel Pacing Threshold Pulse Width: 0.4 ms
Lead Channel Pacing Threshold Pulse Width: 0.4 ms
Lead Channel Sensing Intrinsic Amplitude: 11.625 mV
Lead Channel Sensing Intrinsic Amplitude: 11.625 mV
Lead Channel Setting Pacing Amplitude: 2 V
Lead Channel Setting Pacing Amplitude: 2.5 V
Lead Channel Setting Pacing Pulse Width: 0.4 ms
Lead Channel Setting Pacing Pulse Width: 0.4 ms
Lead Channel Setting Sensing Sensitivity: 4 mV

## 2021-11-24 NOTE — Telephone Encounter (Signed)
Remote transmission received 11/24/21.   Currently awaiting for review from Dr. Ladona Ridgel.   Patient called and updated. Appreciative of follow up call.

## 2021-11-24 NOTE — Telephone Encounter (Signed)
°  1. Has your device fired? no ° °2. Is you device beeping? no ° °3. Are you experiencing draining or swelling at device site?no ° °4. Are you calling to see if we received your device transmission?yes ° °5. Have you passed out? no ° ° ° °Please route to Device Clinic Pool °

## 2021-11-26 ENCOUNTER — Other Ambulatory Visit (HOSPITAL_COMMUNITY): Payer: Self-pay

## 2021-11-30 NOTE — Progress Notes (Signed)
Remote pacemaker transmission.   

## 2021-12-02 ENCOUNTER — Other Ambulatory Visit (HOSPITAL_COMMUNITY): Payer: Self-pay

## 2021-12-07 ENCOUNTER — Encounter (HOSPITAL_BASED_OUTPATIENT_CLINIC_OR_DEPARTMENT_OTHER): Payer: Self-pay | Admitting: Cardiovascular Disease

## 2021-12-07 ENCOUNTER — Other Ambulatory Visit: Payer: Self-pay

## 2021-12-07 ENCOUNTER — Ambulatory Visit (INDEPENDENT_AMBULATORY_CARE_PROVIDER_SITE_OTHER): Payer: Medicare Other | Admitting: Cardiovascular Disease

## 2021-12-07 ENCOUNTER — Telehealth: Payer: Self-pay

## 2021-12-07 VITALS — BP 112/64 | HR 81 | Ht 69.0 in | Wt 192.1 lb

## 2021-12-07 DIAGNOSIS — Z950 Presence of cardiac pacemaker: Secondary | ICD-10-CM | POA: Diagnosis not present

## 2021-12-07 DIAGNOSIS — I482 Chronic atrial fibrillation, unspecified: Secondary | ICD-10-CM | POA: Diagnosis not present

## 2021-12-07 DIAGNOSIS — Z5181 Encounter for therapeutic drug level monitoring: Secondary | ICD-10-CM

## 2021-12-07 DIAGNOSIS — I5032 Chronic diastolic (congestive) heart failure: Secondary | ICD-10-CM

## 2021-12-07 DIAGNOSIS — I272 Pulmonary hypertension, unspecified: Secondary | ICD-10-CM | POA: Diagnosis not present

## 2021-12-07 DIAGNOSIS — I10 Essential (primary) hypertension: Secondary | ICD-10-CM

## 2021-12-07 MED ORDER — SACUBITRIL-VALSARTAN 24-26 MG PO TABS: 1.0000 | ORAL_TABLET | Freq: Two times a day (BID) | ORAL | 5 refills | Status: DC

## 2021-12-07 NOTE — Assessment & Plan Note (Signed)
Medtronic CRT-P.  Followed by Dr. Lovena Le.

## 2021-12-07 NOTE — Assessment & Plan Note (Signed)
Status post pacemaker 

## 2021-12-07 NOTE — Assessment & Plan Note (Signed)
He is doing well recently and his breathing is stable.  Given that he has diastolic heart failure and has been hospitalized, we will try switching losartan to Entresto 24/26 mg twice daily.  Check a basic metabolic panel in a week.  He prefers to do this at his PCPs office.  Continue Lasix at current dosing for now and we will reassess his labs as above.

## 2021-12-07 NOTE — Telephone Encounter (Signed)
Pt called stating he saw Dr. Duke Salvia today and she is wanting him to do some labs for Cardiology, but he is wanting to do them here.  He wants to know if [name] will put them in so he can do those in our lab. Please advise.

## 2021-12-07 NOTE — Assessment & Plan Note (Signed)
Blood pressure is well controlled on his current regimen.  We will switch losartan to Entresto to reduce risk of heart failure exacerbation and to help his breathing.  Continue amlodipine, carvedilol, and furosemide.  Check BMP in a week.

## 2021-12-07 NOTE — Assessment & Plan Note (Signed)
He has just trace lower extremity edema on exam.  We will switch losartan to Entresto to reduce risk of heart failure exacerbation and to help his breathing.  Continue amlodipine, carvedilol, and furosemide.  Check BMP in a week.

## 2021-12-07 NOTE — Telephone Encounter (Signed)
Looks like Dr. Duke Salvia has a BMP ordered. Okay to have drawn in your name? Is there any other labs you would like?

## 2021-12-07 NOTE — Progress Notes (Signed)
Cardiology Office Note  Date:  12/07/2021   ID:  Bradley Jock Sr., DOB 06-07-1941, MRN [id number]  PCP:  Colon Branch, MD  Cardiologist:   Skeet Latch, MD   No chief complaint on file.    History of Present Illness: Bradley RAITT Sr. is a 81 y.o. male with chronic atrial fibrillation s/p AVN ablation and CRT-P, chronic diastolic heart failure, mild aortic stenosis, CAD, hypertension, hyperlipidemia, prior ICH (no anticoagulation), COPD, and PAD who presents for follow-up.  He previously underwent left heart catheterization in 2005 and in 2018 and had no significant coronary artery disease. He had a BiV pacemaker and AV nodal ablation 09/08/11. At that time he had reduced systolic function in the setting of atrial fibrillation with RVR. After placement of the biventricular pacemaker his ejection fraction improved to 50-55% in 2017.  He was admitted 02/2017 with COPD exacerbation and acute on chronic diastolic heart failure. BNP was 491 and TEE had pleural effusions on chest x-ray. He improved with diuresis. Echocardiogram at that time revealed LVEF 55-60%, mild mitral regurgitation, and PA SP 55 mmHg.  He followed up with Bradley [name] on 03/09/17 and was doing well.  [name] was admitted 12/2018 with a COPD exacerbation  He developed increasing shortness of breath and cough.  In the hospital he required BiPAP, IV steroids and antibiotics.  He was again admitted 04/2019 with acute respiratory failure due to both COPD exacerbation and acute on chronic diastolic heart failure.  He followed up with Bradley Ransom, PA-C on 05/2019 and his weight was stable at 206 pounds.    [name] was admitted 09/2019 with a COPD exacerbation requiring antibiotics and steroids.  He had an echo 08/2020 that revealed LVEF 55 to 60% with mild LVH.  Right ventricular function was normal.  PASP was 50.6 mmHg.  He was then admitted 03/2021 with COPD exacerbation and acute on chronic diastolic heart failure.  Since discharge he  had been feeling well.    On 07/29/2021 he was admitted with acute on chronic respiratory failure with hypoxia, acute on chronic systolic CHF, and COPD with acute exacerbation. CXR showed evidence of patchy bilateral infiltrates possibly indicating pulmonary edema. His weight had increased approximately 12 kg over the month prior. He was again admitted 08/29/2021 with acute on chronic hypoxic respiratory failure. While in the hospital he was seen by Heart Failure. He was treated with steroids and IV Lasix. He followed up with Heart Failure on 11/22 and was doing well. Bradley [name] was added to his regimen.  He was feeling well at his last visit 09/2021.  He did not appear to be volume overloaded so Lasix was increased to 80 mg in the morning and 40 in the evening.  He had repeat labs 09/2021 that revealed stable renal function and BNP was 193.  Lately he has been feeling well.  He is walking some for exercise and his breathing has been stable.  He has not noted any lower extremity edema, orthopnea, or PND.     Past Medical History:  Diagnosis Date   Abnormal CT scan, chest 2012   CT chest, several lymphadenopathies. Sees pulmonary   Anemia    intermittent   Arthritis    "?back" (09/19/2018)   Atrial fibrillation (HCC)    s/p AV node ablation & BiV PPM implantation 09/08/11 (op dictation pending)   BPH (benign prostatic hyperplasia)    Saw Dr Bradley [name] 2004, normal renal u/s   Bronchitis 08/26/2017  CAD (coronary artery disease)    CHF (congestive heart failure) (HCC)    Thought primarily to be non-systolic although EF down (EF 45-50% 12/2010, down to 35-40% 09/05/11), cath 2008 with no CAD, nuclear study 07/2011 showing Small area of reversibility in the distal ant/lat wall the left ventricle suspicious for ischemia/septal wall HK but felt to be low risk  (per D/C Summary 07/2011)   Chronic bronchitis ([place])    "get it ~ q yr"   Chronic diastolic heart failure ([place])         Chronic respiratory  failure with hypoxia and hypercapnia ([place]) 09/14/2010   Followed in Pulmonary clinic/ [facility]/ [facility]  Started on 02 2lpm at discharge 09/23/11   - PFT's 10/28/2011  FEV1  1.40 (51%)  with ratio 70 and no better p B2 and DLCO 53 corrects to 101    - PFT's 10/18/2014    FEV1 1.71 (59%) with ratio 68 and no sign change p B2 and DLCO 53 corrects to 85  -  HC03   07/28/20  = 33  -  HCO3   03/17/21     = 31     Heart murmur    HLD (hyperlipidemia)    HTN (hypertension)    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   Pleural effusion 2008   S/p decortication   Pneumonia 08/02/2011   Pulmonary HTN (Bradley [name])    per cath 2008   Type II diabetes mellitus ([place]) 1999    Past Surgical History:  Procedure Laterality Date   BI-VENTRICULAR PACEMAKER INSERTION Left 09/08/2011   Procedure: BI-VENTRICULAR PACEMAKER INSERTION (CRT-P);  Surgeon: Evans Lance, MD;  Location: [facility] CATH LAB;  Service: Cardiovascular;  Laterality: Left;   CARDIAC CATHETERIZATION     "couple times; never had balloon or stent" (09/19/2018)   CATARACT EXTRACTION W/ INTRAOCULAR LENS  IMPLANT, BILATERAL Bilateral 08/2018   COLONOSCOPY  03/10/11   normal   INSERT / REPLACE / REMOVE PACEMAKER  09/08/11   pacemaker placement   LUNG DECORTICATION     PLEURAL SCARIFICATION     pneumothorax with fibrothorax  ~ 2010   TONSILLECTOMY     "as a kid"      Current Outpatient Medications  Medication Sig Dispense Refill   acetaminophen (TYLENOL) 500 MG tablet Take 500 mg by mouth every 6 (six) hours as needed for headache or moderate pain.     albuterol (VENTOLIN HFA) 108 (90 Base) MCG/ACT inhaler Inhale 2 puffs into the lungs every 6 (six) hours as needed for wheezing or shortness of breath. 8.5 g 0   amLODipine (NORVASC) 10 MG tablet Take 10 mg by mouth daily.      aspirin 81 MG tablet Take 81 mg by mouth every morning.      atorvastatin (LIPITOR) 20 MG tablet TAKE 1 TABLET AT BEDTIME 90 tablet 1   budesonide (PULMICORT) 0.5 MG/2ML nebulizer solution Take 2 mLs (0.5 mg total) by nebulization 2 (two) times daily. 360 mL 1   calcium-vitamin D (OSCAL WITH D) 500-200 MG-UNIT tablet Take 1 tablet by mouth every morning.     carvedilol (COREG) 25 MG tablet TAKE 1 TABLET TWICE A DAY WITH MEALS 180 tablet 1   cholecalciferol (VITAMIN D3) 25  MCG (1000 UNIT) tablet Take 1,000 Units by mouth daily.     dapagliflozin propanediol (FARXIGA) 10 MG TABS tablet Take 1 tablet (10 mg total) by mouth daily before breakfast. 90 tablet 1   diphenhydramine-acetaminophen (TYLENOL PM) 25-500 MG TABS tablet Take 1 tablet by mouth at bedtime as needed.     furosemide (LASIX) 40 MG tablet TAKE 2 TABLETS EVERY MORNING AND 1 TABLET IN THE LATE AFTERNOON 270 tablet 1   ipratropium-albuterol (DUONEB) 0.5-2.5 (3) MG/3ML SOLN Take 3 mLs by nebulization 2 (two) times daily as needed. 540 mL 1   Lancets (ONETOUCH ULTRASOFT) lancets Check blood sugars no more than twice daily 200 each 12   loratadine (CLARITIN) 10 MG tablet Take 1 tablet (10 mg total) by mouth daily.     Multiple Vitamins-Minerals (MULTIVITAMINS THER. W/MINERALS) TABS tablet Take 1 tablet by mouth daily. 30 each 5   nitroGLYCERIN (NITROSTAT) 0.4 MG SL tablet Place 1 tablet (0.4 mg total) under the tongue every 5 (five) minutes x 3 doses as needed for chest pain. 25 tablet 3   ONETOUCH VERIO test strip USE TO CHECK BLOOD SUGAR NO MORE THAN TWICE A DAY 200 strip 12   pantoprazole (PROTONIX) 40 MG tablet Take 1 tablet (40 mg total) by mouth daily before breakfast. 90 tablet 1   tamsulosin (FLOMAX) 0.4 MG CAPS capsule TAKE 1 CAPSULE DAILY AFTER SUPPER 90 capsule 3   zolpidem (AMBIEN) 10 MG tablet Take 10 mg by mouth at bedtime.     No current facility-administered medications for this visit.    Allergies:   Hydrocodone,  Tramadol, Tadalafil, and Avelox [moxifloxacin hydrochloride]    Social History:  The patient  reports that he quit smoking about 41 years ago. His smoking use included cigarettes and cigars. He has a 62.50 pack-year smoking history. He has never used smokeless tobacco. He reports that he does not currently use alcohol. He reports that he does not use drugs.   Family History:  The patient's family history includes Breast cancer in his maternal aunt; Coronary artery disease in his father; Diabetes in his father; Drug abuse in his son; Polycythemia in his mother.    ROS:   Please see the history of present illness. All other systems are reviewed and negative.    PHYSICAL EXAM: VS:  BP 112/64 (BP Location: Right Arm, Patient Position: Sitting, Cuff Size: Normal)    Pulse 81    Ht 5\' 9"  (1.753 m)    Wt 192 lb 1.6 oz (87.1 kg)    SpO2 93%    BMI 28.37 kg/m  , BMI Body mass index is 28.37 kg/m. GENERAL:  Well appearing HEENT: Pupils equal round and reactive, fundi not visualized, oral mucosa unremarkable.  Edentulous.   NECK:  No jugular venous distention, waveform within normal limits, carotid upstroke brisk and symmetric, no bruits LUNGS:  Diminished at the bases.  Poor air movement.  HEART:  Irregularly irregular.  PMI not displaced or sustained,S1 and S2 within normal limits, no S3, no S4, no clicks, no rubs, no murmurs ABD:  Flat, positive bowel sounds normal in frequency in pitch, no bruits, no rebound, no guarding, no midline pulsatile mass, no hepatomegaly, no splenomegaly EXT:  2 plus pulses throughout, Trace bilateral ankle edema, no cyanosis no clubbing SKIN:  No rashes no nodules NEURO:  Cranial nerves II through XII grossly intact, motor grossly intact throughout PSYCH:  Cognitively intact, oriented to person place and time   EKG:   09/17/2021:  EKG was not ordered. 06/03/2021: Atrial fibrillation.  Ventricular paced 73 bpm. 11/28/20: VP.  Rate 82 bpm.   08/26/17: VP.  Rate 82 bpm.    05/26/17: atrial fibrillation.  Ventricular paced at 75 bpm  Echo 05/26/18: Study Conclusions   - Left ventricle: The cavity size was normal. Wall thickness was   normal. Systolic function was normal. The estimated ejection   fraction was in the range of 55% to 60%. Although no diagnostic   regional wall motion abnormality was identified, this possibility   cannot be completely excluded on the basis of this study. The   study was not technically sufficient to allow evaluation of LV   diastolic dysfunction due to atrial fibrillation. - Aortic valve: There was no stenosis. - Mitral valve: Moderately calcified annulus. There was trivial   regurgitation. - Left atrium: The atrium was mildly dilated. - Right ventricle: The cavity size was moderately dilated. Pacer   wire or catheter noted in right ventricle. Systolic function was   normal. - Right atrium: The atrium was severely dilated. - Tricuspid valve: Peak RV-RA gradient (S): 32 mm Hg. - Pulmonary arteries: PA peak pressure: 40 mm Hg (S). - Systemic veins: IVC measured 2.2 cm with > 50% respirophasic   variation, suggesting RA pressure 8 mmHg.   Impressions:   - The patient appeared to be in atrial fibrillation. Normal LV size   and systolic function, EF 0000000. Moderately dilated RV with   normal systolic function. Mild pulmonary hypertension. Biatrial   enlargement. No significant valvular abnormalities.  Recent Labs: 09/02/2021: Magnesium 2.3 09/14/2021: ALT 14; Hemoglobin 12.3; Platelets 172.0 09/25/2021: BNP 193.5; BUN 13; Creatinine, Ser 0.87; Potassium 4.8; Sodium 143    Lipid Panel    Component Value Date/Time   CHOL 141 11/11/2021 0928   TRIG 148.0 11/11/2021 0928   HDL 41.10 11/11/2021 0928   CHOLHDL 3 11/11/2021 0928   VLDL 29.6 11/11/2021 0928   LDLCALC 70 11/11/2021 0928    Wt Readings from Last 3 Encounters:  12/07/21 192 lb 1.6 oz (87.1 kg)  11/11/21 191 lb 2 oz (86.7 kg)  09/17/21 189 lb 12.8 oz  (86.1 kg)     ASSESSMENT AND PLAN:  Chronic pulmonary hypertension secondary to elevated L H pressures  He is doing well recently and his breathing is stable.  Given that he has diastolic heart failure and has been hospitalized, we will try switching losartan to Entresto 24/26 mg twice daily.  Check a basic metabolic panel in a week.  He prefers to do this at his PCPs office.  Continue Lasix at current dosing for now and we will reassess his labs as above.  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  Pacemaker-Medtronic Medtronic CRT-P.  Followed by Dr. Lovena Le.  Chronic atrial fibrillation (HCC) AV nodal ablation.  He is not on anticoagulation due to history of intracranial bleed.  Medtronic CRT-P followed by Dr. Lovena Le.  Essential hypertension Blood pressure is well controlled on his current regimen.  We will switch losartan to Entresto to reduce risk of heart failure exacerbation and to help his breathing.  Continue amlodipine, carvedilol, and furosemide.  Check BMP in a week.  Chronic diastolic heart failure (HCC) He has just trace lower extremity edema on exam.  We will switch losartan to Entresto to reduce risk of heart failure exacerbation and to help his breathing.  Continue amlodipine, carvedilol, and furosemide.  Check BMP in a week.    Current medicines are reviewed at length  with the patient today.  The patient does not have concerns regarding medicines.  The following changes have been made: Switch losartan to  [facility]  Labs/ tests ordered today include:   No orders of the defined types were placed in this encounter.    Disposition:   FU with Andjela Wickes C. Oval Linsey, MD, [facility] in 3 months.    Signed, Chamika Cunanan C. Oval Linsey, MD, [facility]  12/07/2021 10:31 AM    [place]

## 2021-12-07 NOTE — Assessment & Plan Note (Signed)
AV nodal ablation.  He is not on anticoagulation due to history of intracranial bleed.  Medtronic CRT-P followed by Dr. Lovena Le.

## 2021-12-07 NOTE — Patient Instructions (Signed)
Medication Instructions:  STOP LOSARTAN   START ENTRESTO 24-26 MG TWICE A DAY   *If you need a refill on your cardiac medications before your next appointment, please call your pharmacy*  Lab Work: BMET 1-2 WEEKS AT DR PAZ IF THEY ARE WILLING TO DO THEM. IF NOT YOU CAN TAKE ORDERS TO ANY [facility] SITE  If you have labs (blood work) drawn today and your tests are completely normal, you will receive your results only by: MyChart Message (if you have MyChart) OR A paper copy in the mail If you have any lab test that is abnormal or we need to change your treatment, we will call you to review the results.  Testing/Procedures: NONE  Follow-Up: At [facility], you and your health needs are our priority.  As part of our continuing mission to provide you with exceptional heart care, we have created designated Provider Care Teams.  These Care Teams include your primary Cardiologist (physician) and Advanced Practice Providers (APPs -  Physician Assistants and Nurse Practitioners) who all work together to provide you with the care you need, when you need it.  We recommend signing up for the patient portal called "MyChart".  Sign up information is provided on this After Visit Summary.  MyChart is used to connect with patients for Virtual Visits (Telemedicine).  Patients are able to view lab/test results, encounter notes, upcoming appointments, etc.  Non-urgent messages can be sent to your provider as well.   To learn more about what you can do with MyChart, go to [email or web address].    Your next appointment:   3 month(s)  The format for your next appointment:   In Person  Provider:   [name], MD Ronn Melena NP OR JESSE C NP   :1}

## 2021-12-08 ENCOUNTER — Other Ambulatory Visit: Payer: Self-pay | Admitting: Internal Medicine

## 2021-12-08 ENCOUNTER — Other Ambulatory Visit (HOSPITAL_BASED_OUTPATIENT_CLINIC_OR_DEPARTMENT_OTHER): Payer: Self-pay | Admitting: *Deleted

## 2021-12-08 DIAGNOSIS — I5032 Chronic diastolic (congestive) heart failure: Secondary | ICD-10-CM

## 2021-12-08 DIAGNOSIS — E118 Type 2 diabetes mellitus with unspecified complications: Secondary | ICD-10-CM | POA: Diagnosis not present

## 2021-12-08 MED ORDER — SACUBITRIL-VALSARTAN 24-26 MG PO TABS: 1.0000 | ORAL_TABLET | Freq: Two times a day (BID) | ORAL | 3 refills | Status: DC

## 2021-12-08 NOTE — Telephone Encounter (Signed)
Pt came by office this morning, will do labs upstairs at [address] [place]. He request I send Entresto to [facility], but informed that cardiology would have to do so- I have sent a message to Dr. Oval Linsey about sending it to [facility] for him.

## 2021-12-08 NOTE — Telephone Encounter (Signed)
Rx(s) sent to pharmacy electronically.  

## 2021-12-18 ENCOUNTER — Telehealth: Payer: Self-pay

## 2021-12-18 ENCOUNTER — Ambulatory Visit (INDEPENDENT_AMBULATORY_CARE_PROVIDER_SITE_OTHER): Payer: Medicare Other

## 2021-12-18 DIAGNOSIS — I5032 Chronic diastolic (congestive) heart failure: Secondary | ICD-10-CM

## 2021-12-18 DIAGNOSIS — J9612 Chronic respiratory failure with hypercapnia: Secondary | ICD-10-CM

## 2021-12-18 DIAGNOSIS — J9611 Chronic respiratory failure with hypoxia: Secondary | ICD-10-CM

## 2021-12-18 NOTE — Chronic Care Management (AMB) (Addendum)
Chronic Care Management   CCM RN Visit Note  12/18/2021 Name: Bradley HANDLEY Sr. MRN: [id number] DOB: 1941/08/31  Subjective: Bradley Searles Sr. is a 81 y.o. year old male who is a primary care patient of Bradley Plump, MD. The care management team was consulted for assistance with disease management and care coordination needs.    Engaged with patient by telephone for follow up visit in response to provider referral for case management and/or care coordination services.   Consent to Services:  The patient was given information about Chronic Care Management services, agreed to services, and gave verbal consent prior to initiation of services.  Please see initial visit note for detailed documentation.   Patient agreed to services and verbal consent obtained.   Assessment: Review of patient past medical history, allergies, medications, health status, including review of consultants reports, laboratory and other test data, was performed as part of comprehensive evaluation and provision of chronic care management services.   SDOH (Social Determinants of Health) assessments and interventions performed:    CCM Care Plan  Allergies  Allergen Reactions   Hydrocodone Other (See Comments)    "given to him in the hospital; went thru withdrawals once home; dr said not to take it again" (09/27/2012)   Tramadol Other (See Comments)    "given to him in the hospital; went thru withdrawals once home; dr said not to take it again" (09/27/2012)   Tadalafil Other (See Comments)    headache, backache   Avelox [Moxifloxacin Hydrochloride] Itching and Other (See Comments)    Headache    Outpatient Encounter Medications as of 12/18/2021  Medication Sig Note   acetaminophen (TYLENOL) 500 MG tablet Take 500 mg by mouth every 6 (six) hours as needed for headache or moderate pain.    albuterol (VENTOLIN HFA) 108 (90 Base) MCG/ACT inhaler Inhale 2 puffs into the lungs every 6 (six) hours as needed for wheezing or  shortness of breath.    amLODipine (NORVASC) 10 MG tablet TAKE 1 TABLET DAILY    aspirin 81 MG tablet Take 81 mg by mouth every morning.     atorvastatin (LIPITOR) 20 MG tablet TAKE 1 TABLET AT BEDTIME    budesonide (PULMICORT) 0.5 MG/2ML nebulizer solution Take 2 mLs (0.5 mg total) by nebulization 2 (two) times daily.    calcium-vitamin D (OSCAL WITH D) 500-200 MG-UNIT tablet Take 1 tablet by mouth every morning.    carvedilol (COREG) 25 MG tablet TAKE 1 TABLET TWICE A DAY WITH MEALS    cholecalciferol (VITAMIN D3) 25 MCG (1000 UNIT) tablet Take 1,000 Units by mouth daily.    dapagliflozin propanediol (FARXIGA) 10 MG TABS tablet Take 1 tablet (10 mg total) by mouth daily before breakfast.    diphenhydramine-acetaminophen (TYLENOL PM) 25-500 MG TABS tablet Take 1 tablet by mouth at bedtime as needed.    furosemide (LASIX) 40 MG tablet TAKE 2 TABLETS EVERY MORNING AND 1 TABLET IN THE LATE AFTERNOON    ipratropium-albuterol (DUONEB) 0.5-2.5 (3) MG/3ML SOLN Take 3 mLs by nebulization 2 (two) times daily as needed.    loratadine (CLARITIN) 10 MG tablet Take 1 tablet (10 mg total) by mouth daily.    Multiple Vitamins-Minerals (MULTIVITAMINS THER. W/MINERALS) TABS tablet Take 1 tablet by mouth daily.    nitroGLYCERIN (NITROSTAT) 0.4 MG SL tablet Place 1 tablet (0.4 mg total) under the tongue every 5 (five) minutes x 3 doses as needed for chest pain. 09/14/2021: PRN   pantoprazole (PROTONIX) 40 MG tablet  Take 1 tablet (40 mg total) by mouth daily before breakfast.    sacubitril-valsartan (ENTRESTO) 24-26 MG Take 1 tablet by mouth 2 (two) times daily.    tamsulosin (FLOMAX) 0.4 MG CAPS capsule TAKE 1 CAPSULE DAILY AFTER SUPPER    zolpidem (AMBIEN) 10 MG tablet Take 10 mg by mouth at bedtime.    Lancets (ONETOUCH ULTRASOFT) lancets Check blood sugars no more than twice daily    ONETOUCH VERIO test strip USE TO CHECK BLOOD SUGAR NO MORE THAN TWICE A DAY    No facility-administered encounter medications on  file as of 12/18/2021.    Patient Active Problem List   Diagnosis Date Noted   GERD without esophagitis 07/30/2021   Cor pulmonale (chronic) (HCC) 08/05/2020   COPD exacerbation (HCC) 12/22/2018   COPD GOLD 0 spirometry/ AB  12/22/2018   PCP NOTES >>> 07/15/2015   Annual physical exam 05/08/2012   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   Sinoatrial node dysfunction (HCC)    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   Chronic diastolic heart failure (HCC)    PAD (peripheral artery disease) (HCC) 10/22/2010   DERMATITIS, STASIS 09/14/2010   Chronic respiratory failure with hypoxia and hypercapnia (HCC) 09/14/2010   Chronic pulmonary hypertension secondary to elevated L H pressures  04/15/2009   ERECTILE DYSFUNCTION 01/06/2009   Mixed diabetic hyperlipidemia associated with type 2 diabetes mellitus (HCC) 12/13/2007   Essential hypertension 12/13/2007   Anxiety--insomnia 08/14/2007   Type 2 diabetes mellitus without complication, without long-term current use of insulin (HCC) 01/02/2007   Chronic atrial fibrillation (HCC) 01/02/2007   Benign enlargement of prostate 01/02/2007    Conditions to be addressed/monitored:CHF, HTN, COPD, and DMII  Care Plan : RN Care Manager Plan of Care  Updates made by Bradley Maryland, RN since 12/18/2021 12:00 AM     Problem: No Plan of Care Established for Management of Chronic Disease (COPD, CHF)   Priority: High     Long-Range Goal: Development of Plan of Care for Chronic Disease (COPD, CHF)   Start Date: 08/17/2021  Expected End Date: 02/14/2022  Recent Progress: On track  Priority: High  Note:   Current Barriers: Received return call from Bradley [name] who denies any questions or concerns. He reports he is taking medications as prescribed, checking blood pressure and weights. Denies any signsl/symptoms of HF exacerbation of COPD exacerbation. He reports he has not had to use his rescue inhaler.  Chronic Disease Management support and education needs  related to CHF and COPD  RNCM Clinical Goal(s):  Patient will verbalize basic understanding of  CHF, COPD, and DMII disease process and self health management plan . attend all scheduled medical appointments:   demonstrate Ongoing adherence to prescribed treatment plan for CHF, COPD, and DMII as evidenced by taking medications as prescribed, attending provider visits as scheduled, calling providers with questions or concerns as needed. work with pharmacist to address medications/disease managment related toCHF, COPD, and DMII through collaboration with Medical illustrator, provider, and care team.   Interventions: 1:1 collaboration with primary care provider regarding development and update of comprehensive plan of care as evidenced by provider attestation and co-signature Inter-disciplinary care team collaboration (see longitudinal plan of care) Evaluation of current treatment plan related to  self management and patient's adherence to plan as established by provider  Diabetes Interventions: Goal on Track: Yes Long term Assessed patient's understanding of A1c goal: <7% Reviewed medications with patient and discussed importance of medication adherence Reiterated goal FBS 80-130 and <180 after eating Upcoming/scheduled  appointment reviewed Provided positive feedback regarding self management of his health  Lab Results  Component Value Date   HGBA1C 5.8 11/11/2021  COPD Interventions: Goal on Track: Yes. Long Term Advised patient to track and manage COPD triggers Encouraged to continue to attend provider visit as scheduled Medications reviewed and encouraged to continue to take medications as recommended Discussed importance of remaining active and Encouraged to maintain activities as tolerated  Heart Failure Interventions: Goal on Track: Yes. Long Term Advised patient to weigh each morning after emptying bladder Reiterated signs/symptoms of heart failure exacerbation and when to call the  doctor Encouraged to continue to take medications as prescribed Upcoming/scheduled appointments reviewed with patient Provided positive feedback regarding self management of chronic conditions Encouraged to be as active as possible. Reports he continues to walk to mailbox and around the home and also goes to the store with wife.  Patient Goals/Self-Care Activities: Continue to take all medications as prescribed dress right for the weather, hot or cold Continue to eat healthy: lean protein, fruits, vegetables, healthy fats, monitor salt intake follow rescue plan if symptoms flare-up Continue to remain active as tolerated and per provider recommendation. Continue to check and record blood sugars daily. Goal is 80-130 before eating and less than 180 if check about 2 hours after eating Continue to attend provider visits as scheduled/recommended Call your provider for any health questions or concerns Continue to work with Care Management department for ongoing care management and/or care coordination needs.   Plan: Per patient request follow up 2-3 months. Telephone follow up appointment with care management team member scheduled for:  03/09/22 The patient has been provided with contact information for the care management team and has been advised to call with any health related questions or concerns.   Kathyrn Sheriff, RN, MSN, BSN, CCM Care Management Coordinator [facility]   I have reviewed and agree with [facility] Co[facility] documentation.  [name], MD

## 2021-12-18 NOTE — Patient Instructions (Signed)
Visit Information ? ?Thank you for taking time to visit with me today. Please don't hesitate to contact me if I can be of assistance to you before our next scheduled telephone appointment. ? ?Following are the goals we discussed today:  ?Patient Goals/Self-Care Activities: ?Continue to take all medications as prescribed ?dress right for the weather, hot or cold ?Continue to eat healthy: lean protein, fruits, vegetables, healthy fats, monitor salt intake ?follow rescue plan if symptoms flare-up ?Continue to remain active as tolerated and per provider recommendation. ?Continue to check and record blood sugars daily. Goal is 80-130 before eating and less than 180 if check about 2 hours after eating ?Continue to attend provider visits as scheduled/recommended ?Call your provider for any health questions or concerns ? ?Our next appointment is by telephone on 03/09/22 at 3:00 pm ? ?Please call the care guide team at [phone] if you need to cancel or reschedule your appointment.  ? ?If you are experiencing a Mental Health or [place] or need someone to talk to, please call the Suicide and Crisis Lifeline: 988 ?call 1-800-273-TALK (toll free, 24 hour hotline)  ? ?Patient verbalizes understanding of instructions and care plan provided today and agrees to view in [place]. Active MyChart status confirmed with patient.   ? ?Thea Silversmith, RN, MSN, BSN, CCM ?Care Management Coordinator ?[facility] [place] ?[phone]  ?

## 2021-12-18 NOTE — Telephone Encounter (Signed)
?  Care Management  ? ?Follow Up Note ? ? ?12/18/2021 ?Name: Bradley GAUER Sr. MRN: [id number] DOB: 12/29/1940 ? ? ?Referred by: Colon Branch, MD ?Reason for referral : No chief complaint on file. ? ? ?An unsuccessful telephone outreach was attempted today. The patient was referred to the case management team for assistance with care management and care coordination.  A HIPPA compliant phone message was left for the patient providing contact information and requesting a return call.  ? ?Follow Up Plan: The Care Management Team will reach out to patient again over the next 30 days. ? ?Thea Silversmith, RN, MSN, BSN, CCM ?Care Management Coordinator ?[facility] [place] ?[phone]  ? ?

## 2021-12-22 ENCOUNTER — Other Ambulatory Visit (HOSPITAL_COMMUNITY): Payer: Self-pay

## 2021-12-23 ENCOUNTER — Other Ambulatory Visit: Payer: Self-pay | Admitting: *Deleted

## 2021-12-23 DIAGNOSIS — I5032 Chronic diastolic (congestive) heart failure: Secondary | ICD-10-CM

## 2021-12-23 LAB — BASIC METABOLIC PANEL
BUN/Creatinine Ratio: 15 (ref 10–24)
BUN: 11 mg/dL (ref 8–27)
CO2: 33 mmol/L — ABNORMAL HIGH (ref 20–29)
Calcium: 8.8 mg/dL (ref 8.6–10.2)
Chloride: 99 mmol/L (ref 96–106)
Creatinine, Ser: 0.75 mg/dL — ABNORMAL LOW (ref 0.76–1.27)
Glucose: 160 mg/dL — ABNORMAL HIGH (ref 70–99)
Potassium: 4 mmol/L (ref 3.5–5.2)
Sodium: 144 mmol/L (ref 134–144)
eGFR: 91 mL/min/{1.73_m2} (ref >59)

## 2021-12-23 NOTE — Progress Notes (Signed)
bmp 

## 2022-01-08 DIAGNOSIS — I5032 Chronic diastolic (congestive) heart failure: Secondary | ICD-10-CM

## 2022-01-08 DIAGNOSIS — J449 Chronic obstructive pulmonary disease, unspecified: Secondary | ICD-10-CM

## 2022-01-08 DIAGNOSIS — E118 Type 2 diabetes mellitus with unspecified complications: Secondary | ICD-10-CM | POA: Diagnosis not present

## 2022-02-09 LAB — HM DIABETES EYE EXAM

## 2022-02-16 ENCOUNTER — Ambulatory Visit (INDEPENDENT_AMBULATORY_CARE_PROVIDER_SITE_OTHER): Payer: Medicare Other | Admitting: Pharmacist

## 2022-02-16 DIAGNOSIS — I10 Essential (primary) hypertension: Secondary | ICD-10-CM

## 2022-02-16 DIAGNOSIS — E118 Type 2 diabetes mellitus with unspecified complications: Secondary | ICD-10-CM

## 2022-02-16 DIAGNOSIS — J9611 Chronic respiratory failure with hypoxia: Secondary | ICD-10-CM

## 2022-02-16 DIAGNOSIS — J441 Chronic obstructive pulmonary disease with (acute) exacerbation: Secondary | ICD-10-CM

## 2022-02-16 DIAGNOSIS — I5032 Chronic diastolic (congestive) heart failure: Secondary | ICD-10-CM

## 2022-02-16 DIAGNOSIS — E785 Hyperlipidemia, unspecified: Secondary | ICD-10-CM

## 2022-02-16 NOTE — Chronic Care Management (AMB) (Signed)
/ ? ? ?Chronic Care Management ?Pharmacy Note ? ?02/16/2022 ?Name:  Bradley [name] Sr. MRN:  [id number] DOB:  Jul 03, 1941 ? ?Summary:  ?Patient continues to go well. He reports breathing better and weight has been stable. He has not needed to use albuterol rescue inhaler in the last 2 months.  ?Delene Loll was started 12/07/2021 and is tolerating well. Patient reports his secondary insurance covers Bradley [name] with copay of about $90 per 90 days which is affordable for patient.  ?Last blood glucose on BMP was 180. Patient reports he checked blood glucose about weekly and has been 110 to 140. Currently taking Farxiga 83m daily for DM and HFpEF.  ?Reviewed health maintenance with patient - [facility] He requested at appt that report be sent our office. Not scanned to media yet. Reminded to get foot exam. Also discussed getting Shingrix - patient would like to discuss with PCP at next office visit.  ? ?Follow up: phone visit 4 to 6 months. Has phone visit with nurse case manager in June 2023.  ? ?Subjective: ?JWinn Bradley [name]. is an 81y.o. year old male who is a primary patient of Paz, JAlda Berthold MD.  The CCM team was consulted for assistance with disease management and care coordination needs.   ? ?Engaged with patient by telephone for follow up visit in response to provider referral for pharmacy case management and/or care coordination services.  ? ?Consent to Services:  ?The patient was given information about Chronic Care Management services, agreed to services, and gave verbal consent prior to initiation of services.  Please see initial visit note for detailed documentation.  ? ?Patient Care Team: ?PColon Branch MD as PCP - General (Internal Medicine) ?RSkeet Latch MD as PCP - Cardiology (Cardiology) ?TEvans Lance MD as Consulting Physician (Cardiology) ?WTanda Rockers MD as Consulting Physician (Pulmonary Disease) ?GRuby Cola MD as Consulting Physician  (Otolaryngology) ?Poudyal, Ritesh, OD (Optometry) ?ECherre Robins RPH-CPP (Pharmacist) ?WLuretha Rued RN as Case Manager ? ?Recent office visits: ?11/11/2021 - Int Med (Dr PLarose Kells Follow up chronic conditions. A1c was 5.8% - Januvia discontinued. Continued Farxiga. ?09/14/2021 [facility] No med changes noted. Labs ordered ionized calcium, LFTs and CBC. Ordered chest xray. ?08/11/2021 - [facility] No medication changes noted.  ? ?Recent consult visits: ?12/07/2021 - Cardio (Dr ROval Linsey F/U CHF, pulmonary HTN, Afib. Stopped losartan; Started Entresto 24/216mtwice a day. Recommended check BMP in 1 week. ?11/24/2021 Cardio - Remote pacemaker check.  ?09/17/2021 - Cardio (Dr RaLucretia FieldFollow up CHF / SOB. Increased furosemide 4066mo take 84m43mch morning and 40mg34mh afternoon. Check BMP and BNP in 1 week.  ?09/07/2021 - Cardio (ClegNinfa Meeker Seen for HFpEF. Started Farxiag 10mg 50my at breakfast.  ?06/03/21 Cardio (Dr RandolOval Linseyn for general follow up. EKG completed. Follow up in 6 months.  ?Hospital visits: ?[facility]29[facility]2021 [facility] ?Medications started at discharge: predinsone 40mg d34m for 2 days; doxycyline 100mg tw57ma day for 2 days. ? ?Objective: ? ?Lab Results  ?Component Value Date  ? CREATININE 0.75 (L) 12/23/2021  ? CREATININE 0.87 09/25/2021  ? CREATININE 0.83 09/07/2021  ? ? ?Lab Results  ?Component Value Date  ? HGBA1C 5.8 11/11/2021  ? ?Last diabetic Eye exam:  ?Lab Results  ?Component Value Date/Time  ? HMDIABEYEEXA No Retinopathy 05/06/2020 12:00 AM  ?  ?  Last diabetic Foot exam:  ?Lab Results  ?Component Value Date/Time  ? HMDIABFOOTEX Done 07/15/2015 12:00 AM  ?  ? ?   ?Component Value Date/Time  ? CHOL 141 11/11/2021 0928  ? TRIG 148.0 11/11/2021 0928  ? HDL 41.10 11/11/2021 0928  ? CHOLHDL 3 11/11/2021 0928  ? VLDL 29.6 11/11/2021 0928  ? LDLCALC 70 11/11/2021 0928  ? ? ? ?  Latest Ref  Rng & Units 09/14/2021  ?  3:15 PM 07/30/2021  ?  4:53 AM 07/29/2021  ?  6:00 PM  ?Hepatic Function  ?Total Protein 6.0 - 8.3 g/dL 5.9   6.7   6.9    ?Albumin 3.5 - 5.2 g/dL 3.5   3.2   3.4    ?AST 0 - 37 U/L 14   18   26    ?ALT 0 - 53 U/L 14   14   13    ?Alk Phosphatase 39 - 117 U/L 65   89   90    ?Total Bilirubin 0.2 - 1.2 mg/dL 0.5   0.7   1.0    ?Bilirubin, Direct 0.0 - 0.3 mg/dL 0.1      ? ? ?Lab Results  ?Component Value Date/Time  ? TSH 1.61 06/07/2018 02:13 PM  ? TSH 0.300 (L) 05/25/2018 04:46 PM  ? TSH 1.76 11/01/2017 11:08 AM  ? ? ? ?  Latest Ref Rng & Units 09/14/2021  ?  3:15 PM 09/01/2021  ?  2:32 AM 08/31/2021  ?  1:46 AM  ?CBC  ?WBC 4.0 - 10.5 K/uL 7.3   8.1   8.3    ?Hemoglobin 13.0 - 17.0 g/dL 12.3   11.9   11.6    ?Hematocrit 39.0 - 52.0 % 36.8   34.9   34.5    ?Platelets 150.0 - 400.0 K/uL 172.0   228   235    ? ? ?No results found for: VD25OH ? ?Clinical ASCVD: Yes  ?The ASCVD Risk score (Arnett DK, et al., 2019) failed to calculate for the following reasons: ?  The 2019 ASCVD risk score is only valid for ages 40 to 79   ? ? ?Social History  ? ?Tobacco Use  ?Smoking Status Former  ? Packs/day: 2.50  ? Years: 25.00  ? Pack years: 62.50  ? Types: Cigarettes, Cigars  ? Quit date: 10/11/1980  ? Years since quitting: 41.3  ?Smokeless Tobacco Never  ? ?BP Readings from Last 3 Encounters:  ?12/07/21 112/64  ?11/11/21 136/78  ?09/17/21 112/74  ? ?Pulse Readings from Last 3 Encounters:  ?12/07/21 81  ?11/11/21 (!) 101  ?09/17/21 87  ? ?Wt Readings from Last 3 Encounters:  ?12/07/21 192 lb 1.6 oz (87.1 kg)  ?11/11/21 191 lb 2 oz (86.7 kg)  ?09/17/21 189 lb 12.8 oz (86.1 kg)  ? ? ?Assessment: Review of patient past medical history, allergies, medications, health status, including review of consultants reports, laboratory and other test data, was performed as part of comprehensive evaluation and provision of chronic care management services.  ? ?SDOH:  (Social Determinants of Health) assessments and  interventions performed:  ?SDOH Interventions   ? ?Flowsheet Row Most Recent Value  ?SDOH Interventions   ?Financial Strain Interventions Intervention Not Indicated  ? ?  ? ? ?CCM Care Plan ? ?Allergies  ?Allergen Reactions  ? Hydrocodone Other (See Comments)  ?  "given to him in the hospital; went thru withdrawals once home; dr said not to take it again" (09/27/2012)  ? Tramadol   Other (See Comments)  ?  "given to him in the hospital; went thru withdrawals once home; dr said not to take it again" (09/27/2012)  ? Tadalafil Other (See Comments)  ?  headache, backache  ? Avelox [Moxifloxacin Hydrochloride] Itching and Other (See Comments)  ?  Headache  ? ? ?Medications Reviewed Today   ? ? Reviewed by Eckard, Tammy, RPH-CPP (Pharmacist) on 02/16/22 at 0940  Med List Status: <None>  ? ?Medication Order Taking? Sig Documenting Provider Last Dose Status Informant  ?acetaminophen (TYLENOL) 500 MG tablet [id number] Yes Take 500 mg by mouth every 6 (six) hours as needed for headache or moderate pain. [provider] Taking Active Self  ?albuterol (VENTOLIN HFA) 108 (90 Base) MCG/ACT inhaler [id number] Yes Inhale 2 puffs into the lungs every 6 (six) hours as needed for wheezing or shortness of breath. Ghimire, Shanker M, MD Taking Active Self  ?amLODipine (NORVASC) 10 MG tablet [id number] Yes TAKE 1 TABLET DAILY Paz, Jose E, MD Taking Active   ?aspirin 81 MG tablet [id number]  Take 81 mg by mouth every morning.  [provider]  Active Self  ?atorvastatin (LIPITOR) 20 MG tablet [id number]  TAKE 1 TABLET AT BEDTIME Paz, Jose E, MD  Active   ?budesonide (PULMICORT) 0.5 MG/2ML nebulizer solution [id number] Yes Take 2 mLs (0.5 mg total) by nebulization 2 (two) times daily. Paz, Jose E, MD Taking Active Self  ?calcium-vitamin D (OSCAL WITH D) 500-200 MG-UNIT tablet [id number] Yes Take 1 tablet by mouth every morning. [provider] Taking Active Self  ?carvedilol (COREG) 25 MG tablet [id number] Yes TAKE 1 TABLET  TWICE A DAY WITH MEALS Paz, Jose E, MD Taking Active   ?cholecalciferol (VITAMIN D3) 25 MCG (1000 UNIT) tablet [id number] Yes Take 1,000 Units by mouth daily. [provider] Taking Active Self  ?dapagliflozin

## 2022-02-16 NOTE — Patient Instructions (Addendum)
Bradley [name] ?It was a pleasure speaking with you again.  ?Below is a summary of your health goals and care plan for the next 6 months. You are doing well. ? ?Patient Goals/Self-Care Activities ?take medications as prescribed ?check blood pressure 2 to 3 times per week, document, and provide at future appointments ?check blood glucose 2 to 3 times per week, document, and provide at future appointments ?Check weight daily and record for future appointments.  ?Report signs and symptoms of CHF exacerbation - weight gain, shortness of breath, abdominal fullness, swelling in legs or abdomen, Fatigue and weakness, changes in ability to perform usual activities, persistent cough or wheezing with white or pink blood-tinged mucus, nausea and lack of appetite ?Continue to weigh daily - report weight gain of more than 3 lbs in 24 hours or 5 lbs in 1 week. ?target a minimum of 150 minutes of moderate intensity exercise weekly ?Consider getting Shingrix vaccine series - discuss with Dr Drue Novel at next appointment.  ?Foot exam at next appointment with Dr Drue Novel ? ? ?If you have any questions or concerns, please feel free to contact me either at the phone number below or with a MyChart message.  ? ?Keep up the good work! ? ?Henrene Pastor, PharmD ?Clinical Pharmacist ?[facility] ?[facility] ?[phone] (direct line)  ?[phone] (main office number) ? ? ? ? ?Chronic Care Management Care Plan  updated 02/16/2022 ?Hypertension / Heart Failure ?BP Readings from Last 3 Encounters:  ?12/07/21 112/64  ?11/11/21 136/78  ?09/17/21 112/74  ? ?Pharmacist Clinical Goal(s): ?Over the next 120 days, patient will work with PharmD and providers to maintain BP goal <130/80 ?Current regimen:  ?Amlodipine 10mg  daily  ?Entresto 24/26mg  twice a day  ?Carvedilol 25mg  twice daily ?Farxiga 10mg  daily (beneficial for heart and blood glucose / diabetes) ?Interventions: ?Recommended he continue to check his blood pressure 2 to 3 time per  week ?Patient self care activities - Over the next 120 days, patient will: ?Check blood pressure 2 to 3 times per week, document, and provide at future appointments ?Ensure daily salt intake < 2300 mg/day ?Continue current medications to lower blood pressure and heart  ?Check weight daily and record for future appointments.  ?Report signs and symptoms of CHF exacerbation - weight gain, shortness of breath, abdominal fullness, swelling in legs or abdomen, Fatigue and weakness, changes in ability to perform usual activities, persistent cough or wheezing with white or pink blood-tinged mucus, nausea and lack of appetite ?Continue to weigh daily and record for future appointments ? ?Hyperlipidemia/PAD ?Lab Results  ?Component Value Date/Time  ? LDLCALC 70 11/11/2021 09:28 AM  ? ?Pharmacist Clinical Goal(s): ?Over the next 120 days, patient will work with PharmD and providers to maintain LDL goal < 70 ?Current regimen:  ?Atorvastatin 20mg  daily ?Aspirin 81mg  daily ?Nitroglycerin 0.4mg  as needed ?Patient self care activities - Over the next 120 days, patient will: ?Maintain cholesterol medication regimen. ?Be physically active. Continue walking to mailbox as able. Work up to longer walks if able. Goal is to get at least 150 minutes of physical activity per week  ? ?Diabetes ?Lab Results  ?Component Value Date/Time  ? HGBA1C 5.8 11/11/2021 09:28 AM  ? HGBA1C 5.9 (H) 07/30/2021 04:53 AM  ? ?Pharmacist Clinical Goal(s): ?Over the next 120 days, patient will work with PharmD and providers to maintain A1c goal <7% ?Current regimen:  ?Farxiga 10mg  daily ?Interventions: ?Discussed a1c goal  ?Patient self care activities - Over the next 90 days, patient  will: ?Check blood sugar 2 to 3 times per week, document, and provide at future appointments ?Reviewed home blood glucose readings and reviewed goals  ?Fasting blood glucose goal (before meals) = 80 to 130 ?Blood glucose goal after a meal = less than 180  ?Contact provider with  any episodes of hypoglycemia  /blood glucose < 80 ? ?GERD / acid reflux ?Pharmacist Clinical Goal(s) ?Over the next 120 days, patient will work with PharmD and providers to reduce symptoms of GERD and reduce polypharmacy ?Current regimen:  ?Pantoprazole 40mg  daily ?Interventions: ?Discussed the risk/benefit of continuation vs discontinuation of long term pantoprazole use  ?Patient self care activities - Over the next 120 days, patient will: ?Maintain current regimen for GERD / acid reflux ? ?Medication management ?Pharmacist Clinical Goal(s): ?Over the next 120 days, patient will work with PharmD and providers to maintain optimal medication adherence ?Current pharmacy: [facility] ?Interventions ?Comprehensive medication review performed. ?Continue current medication management strategy ?Reviewed refill history and assess adherence - patient has filled medications on time over the last 3 months.  ? ? ?Follow Up Plan: The care management team will reach out to the patient again over the next 3 to 6 months. Patient will continue to be followed by Nurse case manager and alert Clinical Pharmacist Practitioner to medication needs. ? ?Patient verbalizes understanding of instructions and care plan provided today and agrees to view in MyChart. Active MyChart status confirmed with patient.    ?

## 2022-02-23 LAB — CUP PACEART REMOTE DEVICE CHECK
Battery Remaining Longevity: 4 mo
Battery Voltage: 2.83 V
Brady Statistic AP VP Percent: 0 %
Brady Statistic AP VS Percent: 0 %
Brady Statistic AS VP Percent: 98.59 %
Brady Statistic AS VS Percent: 1.41 %
Brady Statistic RA Percent Paced: 0 %
Brady Statistic RV Percent Paced: 99.29 %
Date Time Interrogation Session: 20230516115746
Implantable Lead Implant Date: 20121128
Implantable Lead Implant Date: 20121128
Implantable Lead Location: 753858
Implantable Lead Location: 753860
Implantable Lead Model: 4194
Implantable Lead Model: 5076
Implantable Pulse Generator Implant Date: 20121128
Lead Channel Impedance Value: 361 Ohm
Lead Channel Impedance Value: 4047 Ohm
Lead Channel Impedance Value: 4047 Ohm
Lead Channel Impedance Value: 437 Ohm
Lead Channel Impedance Value: 437 Ohm
Lead Channel Impedance Value: 456 Ohm
Lead Channel Impedance Value: 475 Ohm
Lead Channel Impedance Value: 551 Ohm
Lead Channel Impedance Value: 646 Ohm
Lead Channel Pacing Threshold Amplitude: 0.625 V
Lead Channel Pacing Threshold Amplitude: 1.375 V
Lead Channel Pacing Threshold Pulse Width: 0.4 ms
Lead Channel Pacing Threshold Pulse Width: 0.4 ms
Lead Channel Sensing Intrinsic Amplitude: 11.625 mV
Lead Channel Sensing Intrinsic Amplitude: 11.625 mV
Lead Channel Setting Pacing Amplitude: 2 V
Lead Channel Setting Pacing Amplitude: 2.5 V
Lead Channel Setting Pacing Pulse Width: 0.4 ms
Lead Channel Setting Pacing Pulse Width: 0.4 ms
Lead Channel Setting Sensing Sensitivity: 4 mV

## 2022-02-26 ENCOUNTER — Other Ambulatory Visit (HOSPITAL_BASED_OUTPATIENT_CLINIC_OR_DEPARTMENT_OTHER): Payer: Self-pay | Admitting: Cardiovascular Disease

## 2022-02-26 NOTE — Telephone Encounter (Signed)
Rx(s) sent to pharmacy electronically.  

## 2022-03-05 ENCOUNTER — Other Ambulatory Visit: Payer: Self-pay

## 2022-03-05 MED ORDER — PANTOPRAZOLE SODIUM 40 MG PO TBEC: 40.0000 mg | DELAYED_RELEASE_TABLET | Freq: Every day | ORAL | 1 refills | Status: DC

## 2022-03-09 ENCOUNTER — Ambulatory Visit: Payer: Medicare Other

## 2022-03-09 ENCOUNTER — Ambulatory Visit (INDEPENDENT_AMBULATORY_CARE_PROVIDER_SITE_OTHER): Payer: Medicare Other | Admitting: Cardiovascular Disease

## 2022-03-09 ENCOUNTER — Encounter (HOSPITAL_BASED_OUTPATIENT_CLINIC_OR_DEPARTMENT_OTHER): Payer: Self-pay | Admitting: Cardiovascular Disease

## 2022-03-09 DIAGNOSIS — I10 Essential (primary) hypertension: Secondary | ICD-10-CM

## 2022-03-09 DIAGNOSIS — I5032 Chronic diastolic (congestive) heart failure: Secondary | ICD-10-CM | POA: Diagnosis not present

## 2022-03-09 DIAGNOSIS — E118 Type 2 diabetes mellitus with unspecified complications: Secondary | ICD-10-CM

## 2022-03-09 DIAGNOSIS — I495 Sick sinus syndrome: Secondary | ICD-10-CM

## 2022-03-09 DIAGNOSIS — I482 Chronic atrial fibrillation, unspecified: Secondary | ICD-10-CM | POA: Diagnosis not present

## 2022-03-09 DIAGNOSIS — J9611 Chronic respiratory failure with hypoxia: Secondary | ICD-10-CM

## 2022-03-09 NOTE — Assessment & Plan Note (Signed)
Status post biventricular pacemaker.  He follows with Dr. Lovena Le.  Battery has 13 months and they are watching him closely.

## 2022-03-09 NOTE — Chronic Care Management (AMB) (Addendum)
Chronic Care Management   CCM RN Visit Note  03/09/2022 Name: Bradley [name] Sr. MRN: [id number] DOB: 1940-10-27  Subjective: Bradley Searles Sr. is a 81 y.o. year old male who is a primary care patient of Bradley Plump, MD. The care management team was consulted for assistance with disease management and care coordination needs.    Engaged with patient by telephone for follow up visit in response to provider referral for case management and/or care coordination services.   Consent to Services:  The patient was given information about Chronic Care Management services, agreed to services, and gave verbal consent prior to initiation of services.  Please see initial visit note for detailed documentation.   Patient agreed to services and verbal consent obtained.   Assessment: Review of patient past medical history, allergies, medications, health status, including review of consultants reports, laboratory and other test data, was performed as part of comprehensive evaluation and provision of chronic care management services.   SDOH (Social Determinants of Health) assessments and interventions performed:    CCM Care Plan  Allergies  Allergen Reactions   Hydrocodone Other (See Comments)    "given to him in the hospital; went thru withdrawals once home; dr said not to take it again" (09/27/2012)   Tramadol Other (See Comments)    "given to him in the hospital; went thru withdrawals once home; dr said not to take it again" (09/27/2012)   Tadalafil Other (See Comments)    headache, backache   Avelox [Moxifloxacin Hydrochloride] Itching and Other (See Comments)    Headache    Outpatient Encounter Medications as of 03/09/2022  Medication Sig Note   acetaminophen (TYLENOL) 500 MG tablet Take 500 mg by mouth every 6 (six) hours as needed for headache or moderate pain.    albuterol (VENTOLIN HFA) 108 (90 Base) MCG/ACT inhaler Inhale 2 puffs into the lungs every 6 (six) hours as needed for wheezing or  shortness of breath.    amLODipine (NORVASC) 10 MG tablet TAKE 1 TABLET DAILY    aspirin 81 MG tablet Take 81 mg by mouth every morning.     atorvastatin (LIPITOR) 20 MG tablet TAKE 1 TABLET AT BEDTIME    budesonide (PULMICORT) 0.5 MG/2ML nebulizer solution Take 2 mLs (0.5 mg total) by nebulization 2 (two) times daily.    calcium-vitamin D (OSCAL WITH D) 500-200 MG-UNIT tablet Take 1 tablet by mouth every morning.    carvedilol (COREG) 25 MG tablet TAKE 1 TABLET TWICE A DAY WITH MEALS    cholecalciferol (VITAMIN D3) 25 MCG (1000 UNIT) tablet Take 1,000 Units by mouth daily.    dapagliflozin propanediol (FARXIGA) 10 MG TABS tablet Take 1 tablet (10 mg total) by mouth daily before breakfast.    diphenhydramine-acetaminophen (TYLENOL PM) 25-500 MG TABS tablet Take 1 tablet by mouth at bedtime as needed.    diphenhydrAMINE-APAP, sleep, (TYLENOL PM EXTRA STRENGTH PO) Take 1 tablet by mouth at bedtime as needed.    furosemide (LASIX) 40 MG tablet TAKE 2 TABLETS EVERY MORNING AND 1 TABLET LATE IN THE AFTERNOON    ipratropium-albuterol (DUONEB) 0.5-2.5 (3) MG/3ML SOLN Take 3 mLs by nebulization 2 (two) times daily as needed.    Lancets (ONETOUCH ULTRASOFT) lancets Check blood sugars no more than twice daily    loratadine (CLARITIN) 10 MG tablet Take 1 tablet (10 mg total) by mouth daily.    Multiple Vitamins-Minerals (MULTIVITAMINS THER. W/MINERALS) TABS tablet Take 1 tablet by mouth daily.    nitroGLYCERIN (NITROSTAT)  0.4 MG SL tablet Place 1 tablet (0.4 mg total) under the tongue every 5 (five) minutes x 3 doses as needed for chest pain. 09/14/2021: PRN   ONETOUCH VERIO test strip USE TO CHECK BLOOD SUGAR NO MORE THAN TWICE A DAY    pantoprazole (PROTONIX) 40 MG tablet Take 1 tablet (40 mg total) by mouth daily before breakfast.    sacubitril-valsartan (ENTRESTO) 24-26 MG Take 1 tablet by mouth 2 (two) times daily.    tamsulosin (FLOMAX) 0.4 MG CAPS capsule TAKE 1 CAPSULE DAILY AFTER SUPPER    zolpidem  (AMBIEN) 10 MG tablet Take 10 mg by mouth at bedtime.    No facility-administered encounter medications on file as of 03/09/2022.    Patient Active Problem List   Diagnosis Date Noted   GERD without esophagitis 07/30/2021   Cor pulmonale (chronic) (HCC) 08/05/2020   COPD exacerbation (HCC) 12/22/2018   COPD GOLD 0 spirometry/ AB  12/22/2018   PCP NOTES >>> 07/15/2015   Annual physical exam 05/08/2012   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   Sinoatrial node dysfunction (HCC)    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   Chronic diastolic heart failure (HCC)    PAD (peripheral artery disease) (HCC) 10/22/2010   DERMATITIS, STASIS 09/14/2010   Chronic respiratory failure with hypoxia and hypercapnia (HCC) 09/14/2010   Chronic pulmonary hypertension secondary to elevated L H pressures  04/15/2009   ERECTILE DYSFUNCTION 01/06/2009   Mixed diabetic hyperlipidemia associated with type 2 diabetes mellitus (HCC) 12/13/2007   Essential hypertension 12/13/2007   Anxiety--insomnia 08/14/2007   Type 2 diabetes mellitus without complication, without long-term current use of insulin (HCC) 01/02/2007   Chronic atrial fibrillation (HCC) 01/02/2007   Benign enlargement of prostate 01/02/2007    Conditions to be addressed/monitored:CHF, HTN, COPD, and DMII  Care Plan : RN Care Manager Plan of Care  Updates made by Colletta Maryland, RN since 03/09/2022 12:00 AM     Problem: No Plan of Care Established for Management of Chronic Disease (COPD, CHF)   Priority: High     Long-Range Goal: Development of Plan of Care for Chronic Disease (COPD, CHF)   Start Date: 08/17/2021  Expected End Date: 02/14/2022  Recent Progress: On track  Priority: High  Note:   Current Barriers:  Chronic Disease Management support and education needs related to CHF and COPD 03/09/22 [name] reports doing well. He reports a follow up visit with cardiologist completed today. He denies any signs/symptoms of COPD exacerbation.  He reports he is taking medications as prescribed. Weight today at cardiology visit 194 pounds; weight at cardiology visit on 12/07/21 192 pounds.  He denies any questions or concerns.  RNCM Clinical Goal(s):  Patient will verbalize basic understanding of  CHF, COPD, and DMII disease process and self health management plan . attend all scheduled medical appointments:   demonstrate Ongoing adherence to prescribed treatment plan for CHF, COPD, and DMII as evidenced by taking medications as prescribed, attending provider visits as scheduled, calling providers with questions or concerns as needed. work with pharmacist to address medications/disease managment related toCHF, COPD, and DMII through collaboration with Medical illustrator, provider, and care team.   Interventions: 1:1 collaboration with primary care provider regarding development and update of comprehensive plan of care as evidenced by provider attestation and co-signature Inter-disciplinary care team collaboration (see longitudinal plan of care) Evaluation of current treatment plan related to  self management and patient's adherence to plan as established by provider  Diabetes Interventions: Goal on Track: Yes Long  term Assessed patient's understanding of A1c goal: <7% Reviewed medications with patient and discussed importance of medication adherence Reinforced goal FBS 80-130 and <180 after eating Reviewed upcoming/scheduled appointments Provided positive feedback to continue self health management  Lab Results  Component Value Date   HGBA1C 5.8 11/11/2021  COPD Interventions: Goal on Track: Yes. Long Term Advised patient to track and manage COPD triggers Encouraged to continue to attend provider visit as scheduled Reviewed medications and encouraged to continue to take medications as recommended Encouraged to maintain activities as tolerated  Heart Failure Interventions: Goal on Track: Yes. Long Term Reiterated signs/symptoms of  heart failure exacerbation and when to call the doctor Encouraged to continue to take medications as prescribed Upcoming/scheduled appointments reviewed with patient Provided positive feedback regarding self health management Encouraged to continue to be as active as possible  Patient Goals/Self-Care Activities: Call pharmacy for medication refills 3-7 days in advance of running out of medications Call provider office for new concerns or questions  Continue to take all medications as prescribed Continue to eat healthy: lean protein, fruits, vegetables, healthy fats, monitor salt intake follow rescue plan if symptoms flare-up Continue to remain active as tolerated and per provider recommendation. Continue to check and record blood sugars as recommended: Goal is 80-130 before eating and less than 180 if check about 2 hours after eating Continue to attend provider visits as scheduled/recommended Call your provider for any health questions or concerns Continue to work with Care Management department for ongoing care management and/or care coordination needs   Plan:The patient has been provided with contact information for the care management team and has been advised to call with any health related questions or concerns.  The care management team will reach out to the patient again over the next 3 months.  Kathyrn Sheriff, RN, MSN, BSN, CCM Care Management Coordinator [facility] [phone]   I have reviewed and agree with [facility] Coaches documentation.  [name], MD

## 2022-03-09 NOTE — Patient Instructions (Signed)
Medication Instructions:  Your physician recommends that you continue on your current medications as directed. Please refer to the Current Medication list given to you today.   *If you need a refill on your cardiac medications before your next appointment, please call your pharmacy*  Lab Work: NONE  Testing/Procedures: NONE  Follow-Up: At [facility], you and your health needs are our priority.  As part of our continuing mission to provide you with exceptional heart care, we have created designated Provider Care Teams.  These Care Teams include your primary Cardiologist (physician) and Advanced Practice Providers (APPs -  Physician Assistants and Nurse Practitioners) who all work together to provide you with the care you need, when you need it.  We recommend signing up for the patient portal called "MyChart".  Sign up information is provided on this After Visit Summary.  MyChart is used to connect with patients for Virtual Visits (Telemedicine).  Patients are able to view lab/test results, encounter notes, upcoming appointments, etc.  Non-urgent messages can be sent to your provider as well.   To learn more about what you can do with MyChart, go to [email or web address].    Your next appointment:   6 month(s)  The format for your next appointment:   In Person  Provider:   Tiffany [place], MD            

## 2022-03-09 NOTE — Patient Instructions (Signed)
Visit Information  Thank you for taking time to visit with me today. Please don't hesitate to contact me if I can be of assistance to you before our next scheduled telephone appointment.  Following are the goals we discussed today:  Patient Goals/Self-Care Activities: Call pharmacy for medication refills 3-7 days in advance of running out of medications Call provider office for new concerns or questions  Continue to take all medications as prescribed Continue to eat healthy: lean protein, fruits, vegetables, healthy fats, monitor salt intake follow rescue plan if symptoms flare-up Continue to remain active as tolerated and per provider recommendation. Continue to check and record blood sugars as recommended: Goal is 80-130 before eating and less than 180 if check about 2 hours after eating Continue to attend provider visits as scheduled/recommended Call your provider for any health questions or concerns Continue to work with Care Management department for ongoing care management and/or care coordination needs  Our next appointment is by telephone on 06/08/22 at 2:00 pm  Please call the care guide team at [phone] if you need to cancel or reschedule your appointment.   If you are experiencing a Mental Health or Behavioral Health Crisis or need someone to talk to, please call the Suicide and Crisis Lifeline: 988 call 1-800-273-TALK (toll free, 24 hour hotline)   Patient verbalizes understanding of instructions and care plan provided today and agrees to view in MyChart. Active MyChart status and patient understanding of how to access instructions and care plan via MyChart confirmed with patient.     Kathyrn Sheriff, RN, MSN, BSN, CCM Care Management Coordinator [phone]

## 2022-03-09 NOTE — Assessment & Plan Note (Signed)
Blood pressure is well-controlled.  It has been low but he has no LH/dizziness.  Continue amlodipine, carvedilol, and Entresto.

## 2022-03-09 NOTE — Assessment & Plan Note (Signed)
He remains in atrial fibrillation with biventricular pacing.  Continue carvedilol.  He is not on anticoagulation due to history of intracranial hemorrhage.

## 2022-03-09 NOTE — Assessment & Plan Note (Signed)
He has been doing great since starting Entresto.  He notes improvement in his breathing. BP is low but he denies LH/dizziess.  Continue current regimen.

## 2022-03-09 NOTE — Assessment & Plan Note (Signed)
He has no claudication and lipids are well controlled.  Continue aspirin and atorvastatin.

## 2022-03-09 NOTE — Progress Notes (Signed)
Cardiology Office Note  Date:  03/09/2022   ID:  Bradley Searles Sr., DOB Oct 27, 1940, MRN [id number]  PCP:  [name], MD  Cardiologist:   [name], MD   Chief Complaint  Patient presents with   Follow-up    3 months.     History of Present Illness: Bradley DANDY Sr. is a 81 y.o. male with chronic atrial fibrillation s/p AVN ablation and CRT-P, chronic diastolic heart failure, mild aortic stenosis, CAD, hypertension, hyperlipidemia, prior ICH (no anticoagulation), COPD, and PAD who presents for follow-up.  He previously underwent left heart catheterization in 2005 and in 2018 and had no significant coronary artery disease. He had a BiV pacemaker and AV nodal ablation 09/08/11. At that time he had reduced systolic function in the setting of atrial fibrillation with RVR. After placement of the biventricular pacemaker his ejection fraction improved to 50-55% in 2017.  He was admitted 02/2017 with COPD exacerbation and acute on chronic diastolic heart failure. BNP was 491 and TEE had pleural effusions on chest x-ray. He improved with diuresis. Echocardiogram at that time revealed LVEF 55-60%, mild mitral regurgitation, and PA SP 55 mmHg.  He followed up with [name] on 03/09/17 and was doing well.  Bradley [name] was admitted 12/2018 with a COPD exacerbation  He developed increasing shortness of breath and cough.  In the hospital he required BiPAP, IV steroids and antibiotics.  He was again admitted 04/2019 with acute respiratory failure due to both COPD exacerbation and acute on chronic diastolic heart failure.  He followed up with Bradley Shelter, PA-C on 05/2019 and his weight was stable at 206 pounds.    Bradley [name] was admitted 09/2019 with a COPD exacerbation requiring antibiotics and steroids.  He had an echo 08/2020 that revealed LVEF 55 to 60% with mild LVH.  Right ventricular function was normal.  PASP was 50.6 mmHg.  He was then admitted 03/2021 with COPD exacerbation and acute on chronic  diastolic heart failure.  Since discharge he had been feeling well.    On 07/29/2021 he was admitted with acute on chronic respiratory failure with hypoxia, acute on chronic systolic CHF, and COPD with acute exacerbation. CXR showed evidence of patchy bilateral infiltrates possibly indicating pulmonary edema. His weight had increased approximately 12 kg over the month prior. He was again admitted 08/29/2021 with acute on chronic hypoxic respiratory failure. While in the hospital he was seen by Heart Failure. He was treated with steroids and IV Lasix. He followed up with Heart Failure on 11/22 and was doing well. Bradley [name] was added to his regimen.  He was feeling well at his last visit 09/2021.  He did not appear to be volume overloaded so Lasix was increased to 80 mg in the morning and 40 in the evening.  He had repeat labs 09/2021 that revealed stable renal function and BNP was 193.    At his last appointment he was switched from losartan to [facility].  He was feeling well physically.  He has been doing more work at home because his wife isnt' able to get around as much. He has been feeling great.  His breathing is great.  He is using his oxygen less.   His breathing is better and he has been able to do strenuous activity.  He has been laying sod and working in the yard without difficulty.  He has not had any lower extremity edema, orthopnea, or PND.  Overall he is well and without  complaint.  Past Medical History:  Diagnosis Date   Abnormal CT scan, chest 2012   CT chest, several lymphadenopathies. Sees pulmonary   Anemia    intermittent   Arthritis    "?back" (09/19/2018)   Atrial fibrillation (HCC)    s/p AV node ablation & BiV PPM implantation 09/08/11 (op dictation pending)   BPH (benign prostatic hyperplasia)    Saw Dr Reece Agar 2004, normal renal u/s   Bronchitis 08/26/2017   CAD (coronary artery disease)    CHF (congestive heart failure) ([place])    Thought primarily to be non-systolic  although EF down (EF 45-50% 12/2010, down to 35-40% 09/05/11), cath 2008 with no CAD, nuclear study 07/2011 showing Small area of reversibility in the distal ant/lat wall the left ventricle suspicious for ischemia/septal wall HK but felt to be low risk  (per D/C Summary 07/2011)   Chronic bronchitis ([place])    "get it ~ q yr"   Chronic diastolic heart failure ([place])         Chronic respiratory failure with hypoxia and hypercapnia ([place]) 09/14/2010   Followed in Pulmonary clinic/ [facility]/ [place]  Started on 02 2lpm at discharge 09/23/11   - PFT's 10/28/2011  FEV1  1.40 (51%)  with ratio 70 and no better p B2 and DLCO 53 corrects to 101    - PFT's 10/18/2014    FEV1 1.71 (59%) with ratio 68 and no sign change p B2 and DLCO 53 corrects to 85  -  HC03   07/28/20  = 33  -  HCO3   03/17/21     = 31     Heart murmur    HLD (hyperlipidemia)    HTN (hypertension)    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   Pleural effusion 2008   S/p decortication   Pneumonia 08/02/2011   Pulmonary HTN ([place])    per cath 2008   Type II diabetes mellitus ([place]) 1999    Past Surgical History:  Procedure Laterality Date   BI-VENTRICULAR PACEMAKER INSERTION Left 09/08/2011   Procedure: BI-VENTRICULAR PACEMAKER INSERTION (CRT-P);  Surgeon: Evans Lance, MD;  Location: [facility] CATH LAB;  Service: Cardiovascular;  Laterality: Left;   CARDIAC CATHETERIZATION     "couple times; never had balloon or stent" (09/19/2018)   CATARACT EXTRACTION W/ INTRAOCULAR LENS  IMPLANT, BILATERAL Bilateral 08/2018   COLONOSCOPY  03/10/11   normal   INSERT / REPLACE / REMOVE PACEMAKER  09/08/11   pacemaker placement   LUNG DECORTICATION     PLEURAL SCARIFICATION     pneumothorax with fibrothorax  ~ 2010   TONSILLECTOMY     "as a kid"      Current  Outpatient Medications  Medication Sig Dispense Refill   acetaminophen (TYLENOL) 500 MG tablet Take 500 mg by mouth every 6 (six) hours as needed for headache or moderate pain.     albuterol (VENTOLIN HFA) 108 (90 Base) MCG/ACT inhaler Inhale 2 puffs into the lungs every 6 (six) hours as needed for wheezing or shortness of breath. 8.5 g 0   amLODipine (NORVASC) 10 MG tablet TAKE 1 TABLET DAILY 90 tablet 1   aspirin 81 MG tablet Take 81 mg by mouth every morning.  atorvastatin (LIPITOR) 20 MG tablet TAKE 1 TABLET AT BEDTIME 90 tablet 1   budesonide (PULMICORT) 0.5 MG/2ML nebulizer solution Take 2 mLs (0.5 mg total) by nebulization 2 (two) times daily. 360 mL 1   calcium-vitamin D (OSCAL WITH D) 500-200 MG-UNIT tablet Take 1 tablet by mouth every morning.     carvedilol (COREG) 25 MG tablet TAKE 1 TABLET TWICE A DAY WITH MEALS 180 tablet 1   cholecalciferol (VITAMIN D3) 25 MCG (1000 UNIT) tablet Take 1,000 Units by mouth daily.     dapagliflozin propanediol (FARXIGA) 10 MG TABS tablet Take 1 tablet (10 mg total) by mouth daily before breakfast. 90 tablet 1   diphenhydramine-acetaminophen (TYLENOL PM) 25-500 MG TABS tablet Take 1 tablet by mouth at bedtime as needed.     diphenhydrAMINE-APAP, sleep, (TYLENOL PM EXTRA STRENGTH PO) Take 1 tablet by mouth at bedtime as needed.     furosemide (LASIX) 40 MG tablet TAKE 2 TABLETS EVERY MORNING AND 1 TABLET LATE IN THE AFTERNOON 270 tablet 3   ipratropium-albuterol (DUONEB) 0.5-2.5 (3) MG/3ML SOLN Take 3 mLs by nebulization 2 (two) times daily as needed. 540 mL 1   Lancets (ONETOUCH ULTRASOFT) lancets Check blood sugars no more than twice daily 200 each 12   loratadine (CLARITIN) 10 MG tablet Take 1 tablet (10 mg total) by mouth daily.     Multiple Vitamins-Minerals (MULTIVITAMINS THER. W/MINERALS) TABS tablet Take 1 tablet by mouth daily. 30 each 5   nitroGLYCERIN (NITROSTAT) 0.4 MG SL tablet Place 1 tablet (0.4 mg total) under the tongue every 5  (five) minutes x 3 doses as needed for chest pain. 25 tablet 3   ONETOUCH VERIO test strip USE TO CHECK BLOOD SUGAR NO MORE THAN TWICE A DAY 200 strip 12   pantoprazole (PROTONIX) 40 MG tablet Take 1 tablet (40 mg total) by mouth daily before breakfast. 90 tablet 1   sacubitril-valsartan (ENTRESTO) 24-26 MG Take 1 tablet by mouth 2 (two) times daily. 180 tablet 3   tamsulosin (FLOMAX) 0.4 MG CAPS capsule TAKE 1 CAPSULE DAILY AFTER SUPPER 90 capsule 3   zolpidem (AMBIEN) 10 MG tablet Take 10 mg by mouth at bedtime.     No current facility-administered medications for this visit.    Allergies:   Hydrocodone, Tramadol, Tadalafil, and Avelox [moxifloxacin hydrochloride]    Social History:  The patient  reports that he quit smoking about 41 years ago. His smoking use included cigarettes and cigars. He has a 62.50 pack-year smoking history. He has never used smokeless tobacco. He reports that he does not currently use alcohol. He reports that he does not use drugs.   Family History:  The patient's family history includes Breast cancer in his maternal aunt; Coronary artery disease in his father; Diabetes in his father; Drug abuse in his son; Polycythemia in his mother.    ROS:   Please see the history of present illness. All other systems are reviewed and negative.    PHYSICAL EXAM: VS:  BP (!) 90/54 (BP Location: Left Arm, Patient Position: Sitting, Cuff Size: Normal)   Pulse 88   Ht 5\' 9"  (1.753 m)   Wt 194 lb (88 kg)   BMI 28.65 kg/m  , BMI Body mass index is 28.65 kg/m. GENERAL:  Well appearing HEENT: Pupils equal round and reactive, fundi not visualized, oral mucosa unremarkable.  Edentulous.   NECK:  No jugular venous distention, waveform within normal limits, carotid upstroke brisk and symmetric, no bruits LUNGS:  Diminished  at the bases.  Poor air movement.  HEART:  Irregularly irregular.  PMI not displaced or sustained,S1 and S2 within normal limits, no S3, no S4, no clicks, no  rubs, no murmurs ABD:  Flat, positive bowel sounds normal in frequency in pitch, no bruits, no rebound, no guarding, no midline pulsatile mass, no hepatomegaly, no splenomegaly EXT:  2 plus pulses throughout, Trace bilateral ankle edema, no cyanosis no clubbing SKIN:  No rashes no nodules NEURO:  Cranial nerves II through XII grossly intact, motor grossly intact throughout PSYCH:  Cognitively intact, oriented to person place and time   EKG:   03/09/22: BiV pace.  Rate 88 bpm 09/17/2021: EKG was not ordered. 06/03/2021: Atrial fibrillation.  Ventricular paced 73 bpm. 11/28/20: VP.  Rate 82 bpm.   08/26/17: VP.  Rate 82 bpm.   05/26/17: atrial fibrillation.  Ventricular paced at 75 bpm  Echo 05/26/18: Study Conclusions   - Left ventricle: The cavity size was normal. Wall thickness was   normal. Systolic function was normal. The estimated ejection   fraction was in the range of 55% to 60%. Although no diagnostic   regional wall motion abnormality was identified, this possibility   cannot be completely excluded on the basis of this study. The   study was not technically sufficient to allow evaluation of LV   diastolic dysfunction due to atrial fibrillation. - Aortic valve: There was no stenosis. - Mitral valve: Moderately calcified annulus. There was trivial   regurgitation. - Left atrium: The atrium was mildly dilated. - Right ventricle: The cavity size was moderately dilated. Pacer   wire or catheter noted in right ventricle. Systolic function was   normal. - Right atrium: The atrium was severely dilated. - Tricuspid valve: Peak RV-RA gradient (S): 32 mm Hg. - Pulmonary arteries: PA peak pressure: 40 mm Hg (S). - Systemic veins: IVC measured 2.2 cm with > 50% respirophasic   variation, suggesting RA pressure 8 mmHg.   Impressions:   - The patient appeared to be in atrial fibrillation. Normal LV size   and systolic function, EF 0000000. Moderately dilated RV with   normal systolic  function. Mild pulmonary hypertension. Biatrial   enlargement. No significant valvular abnormalities.  Recent Labs: 09/02/2021: Magnesium 2.3 09/14/2021: ALT 14; Hemoglobin 12.3; Platelets 172.0 09/25/2021: BNP 193.5 12/23/2021: BUN 11; Creatinine, Ser 0.75; Potassium 4.0; Sodium 144    Lipid Panel    Component Value Date/Time   CHOL 141 11/11/2021 0928   TRIG 148.0 11/11/2021 0928   HDL 41.10 11/11/2021 0928   CHOLHDL 3 11/11/2021 0928   VLDL 29.6 11/11/2021 0928   LDLCALC 70 11/11/2021 0928    Wt Readings from Last 3 Encounters:  03/09/22 194 lb (88 kg)  12/07/21 192 lb 1.6 oz (87.1 kg)  11/11/21 191 lb 2 oz (86.7 kg)     ASSESSMENT AND PLAN:  Chronic diastolic heart failure (HCC) He has been doing great since starting Entresto.  He notes improvement in his breathing. BP is low but he denies LH/dizziess.  Continue current regimen.    Essential hypertension Blood pressure is well-controlled.  It has been low but he has no LH/dizziness.  Continue amlodipine, carvedilol, and Entresto.   PAD (peripheral artery disease) ([place]) He has no claudication and lipids are well controlled.  Continue aspirin and atorvastatin.  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  He follows with Dr. Lovena Le.  Battery has 13 months and they are watching him closely.  Chronic atrial fibrillation (  [place]) He remains in atrial fibrillation with biventricular pacing.  Continue carvedilol.  He is not on anticoagulation due to history of intracranial hemorrhage.     Current medicines are reviewed at length with the patient today.  The patient does not have concerns regarding medicines.  The following changes have been made: Switch losartan to Entresto  Labs/ tests ordered today include:   Orders Placed This Encounter  Procedures   EKG 12-Lead     Disposition:   FU with Christopher Hink C. Oval Linsey, MD, [facility] in 3 months.    Signed, Paxton Binns C. Oval Linsey, MD, [facility]  03/09/2022  11:43 AM    [place]

## 2022-03-10 DIAGNOSIS — Z7984 Long term (current) use of oral hypoglycemic drugs: Secondary | ICD-10-CM

## 2022-03-10 DIAGNOSIS — I5030 Unspecified diastolic (congestive) heart failure: Secondary | ICD-10-CM

## 2022-03-10 DIAGNOSIS — J449 Chronic obstructive pulmonary disease, unspecified: Secondary | ICD-10-CM

## 2022-03-10 DIAGNOSIS — I110 Hypertensive heart disease with heart failure: Secondary | ICD-10-CM

## 2022-03-10 DIAGNOSIS — Z87891 Personal history of nicotine dependence: Secondary | ICD-10-CM

## 2022-03-10 DIAGNOSIS — E1159 Type 2 diabetes mellitus with other circulatory complications: Secondary | ICD-10-CM

## 2022-03-10 DIAGNOSIS — E785 Hyperlipidemia, unspecified: Secondary | ICD-10-CM

## 2022-03-11 ENCOUNTER — Ambulatory Visit (INDEPENDENT_AMBULATORY_CARE_PROVIDER_SITE_OTHER): Payer: Medicare Other | Admitting: Internal Medicine

## 2022-03-11 ENCOUNTER — Other Ambulatory Visit (HOSPITAL_BASED_OUTPATIENT_CLINIC_OR_DEPARTMENT_OTHER): Payer: Self-pay

## 2022-03-11 ENCOUNTER — Encounter: Payer: Self-pay | Admitting: Internal Medicine

## 2022-03-11 VITALS — BP 126/68 | HR 88 | Temp 98.1°F | Resp 18 | Ht 69.0 in | Wt 189.5 lb

## 2022-03-11 DIAGNOSIS — I5032 Chronic diastolic (congestive) heart failure: Secondary | ICD-10-CM | POA: Diagnosis not present

## 2022-03-11 DIAGNOSIS — J9611 Chronic respiratory failure with hypoxia: Secondary | ICD-10-CM | POA: Diagnosis not present

## 2022-03-11 DIAGNOSIS — E119 Type 2 diabetes mellitus without complications: Secondary | ICD-10-CM

## 2022-03-11 DIAGNOSIS — D649 Anemia, unspecified: Secondary | ICD-10-CM

## 2022-03-11 LAB — CBC WITH DIFFERENTIAL/PLATELET
Basophils Absolute: 0 10*3/uL (ref 0.0–0.1)
Basophils Relative: 0.4 % (ref 0.0–3.0)
Eosinophils Absolute: 0 10*3/uL (ref 0.0–0.7)
Eosinophils Relative: 0.4 % (ref 0.0–5.0)
HCT: 41.6 % (ref 39.0–52.0)
Hemoglobin: 13.9 g/dL (ref 13.0–17.0)
Lymphocytes Relative: 28.8 % (ref 12.0–46.0)
Lymphs Abs: 2.7 10*3/uL (ref 0.7–4.0)
MCHC: 33.5 g/dL (ref 30.0–36.0)
MCV: 93.6 fl (ref 78.0–100.0)
Monocytes Absolute: 0.9 10*3/uL (ref 0.1–1.0)
Monocytes Relative: 9.4 % (ref 3.0–12.0)
Neutro Abs: 5.7 10*3/uL (ref 1.4–7.7)
Neutrophils Relative %: 61 % (ref 43.0–77.0)
Platelets: 169 10*3/uL (ref 150.0–400.0)
RBC: 4.45 Mil/uL (ref 4.22–5.81)
RDW: 15.8 % — ABNORMAL HIGH (ref 11.5–15.5)
WBC: 9.3 10*3/uL (ref 4.0–10.5)

## 2022-03-11 LAB — HEMOGLOBIN A1C: Hgb A1c MFr Bld: 6.1 % (ref 4.6–6.5)

## 2022-03-11 MED ORDER — SHINGRIX 50 MCG/0.5ML IM SUSR
0.5000 mL | Freq: Once | INTRAMUSCULAR | 1 refills | Status: AC
  Filled 2022-03-11: qty 1, 1d supply, fill #0
  Filled 2022-09-10: qty 1, 1d supply, fill #1

## 2022-03-11 NOTE — Patient Instructions (Addendum)
Recommend to proceed with Shingrix (shingles) vaccine at your pharmacy- see printed prescription.   Per our records you are due for your diabetic eye exam. Please contact your eye doctor to schedule an appointment. Please have them send copies of your office visit notes to [place]. Our fax number is [phone]. If you need a referral to an eye doctor please let us know.  Check the  blood pressure regularly BP GOAL is between 110/65 and  135/85. If it is consistently higher or lower, let me know      GO TO THE LAB : Get the blood work     GO TO THE FRONT DESK, PLEASE SCHEDULE YOUR APPOINTMENTS Come back for a checkup in 4 months

## 2022-03-11 NOTE — Progress Notes (Signed)
Subjective:    Patient ID: Rosezella [place]., male    DOB: 1941/05/06, 81 y.o.   MRN: [id number]  DOS:  03/11/2022 Type of visit - description: f/u  Since the last visit he is doing well. His oxygen tank was out of batteries, O2 sat today is 91% on room air but he feels well. Denies chest pain, no lower extremity edema.  Review of Systems See above   Past Medical History:  Diagnosis Date   Abnormal CT scan, chest 2012   CT chest, several lymphadenopathies. Sees pulmonary   Anemia    intermittent   Arthritis    "?back" (09/19/2018)   Atrial fibrillation (HCC)    s/p AV node ablation & BiV PPM implantation 09/08/11 (op dictation pending)   BPH (benign prostatic hyperplasia)    Saw Dr Reece Agar 2004, normal renal u/s   Bronchitis 08/26/2017   CAD (coronary artery disease)    CHF (congestive heart failure) ([place])    Thought primarily to be non-systolic although EF down (EF 45-50% 12/2010, down to 35-40% 09/05/11), cath 2008 with no CAD, nuclear study 07/2011 showing Small area of reversibility in the distal ant/lat wall the left ventricle suspicious for ischemia/septal wall HK but felt to be low risk  (per D/C Summary 07/2011)   Chronic bronchitis ([place])    "get it ~ q yr"   Chronic diastolic heart failure ([place])         Chronic respiratory failure with hypoxia and hypercapnia ([place]) 09/14/2010   Followed in Pulmonary clinic/ [facility]/ [place]  Started on 02 2lpm at discharge 09/23/11   - PFT's 10/28/2011  FEV1  1.40 (51%)  with ratio 70 and no better p B2 and DLCO 53 corrects to 101    - PFT's 10/18/2014    FEV1 1.71 (59%) with ratio 68 and no sign change p B2 and DLCO 53 corrects to 85  -  HC03   07/28/20  = 33  -  HCO3   03/17/21     = 31     Heart murmur    HLD (hyperlipidemia)    HTN (hypertension)    ICB (intracranial bleed) ([place]) 06/2012   d/c coumadin permanently   Insomnia    Migraines    "very very rare"   On home oxygen therapy    "2L; 24/7" (09/19/2018)   Pacemaker     Peripheral vascular disease ([place])    ??   Pleural effusion 2008   S/p decortication   Pneumonia 08/02/2011   Pulmonary HTN ([place])    per cath 2008   Type II diabetes mellitus ([place]) 1999    Past Surgical History:  Procedure Laterality Date   BI-VENTRICULAR PACEMAKER INSERTION Left 09/08/2011   Procedure: BI-VENTRICULAR PACEMAKER INSERTION (CRT-P);  Surgeon: Evans Lance, MD;  Location: [facility] CATH LAB;  Service: Cardiovascular;  Laterality: Left;   CARDIAC CATHETERIZATION     "couple times; never had balloon or stent" (09/19/2018)   CATARACT EXTRACTION W/ INTRAOCULAR LENS  IMPLANT, BILATERAL Bilateral 08/2018   COLONOSCOPY  03/10/11   normal   INSERT / REPLACE / REMOVE PACEMAKER  09/08/11   pacemaker placement   LUNG DECORTICATION     PLEURAL SCARIFICATION     pneumothorax with fibrothorax  ~ 2010   TONSILLECTOMY     "as a kid"     Current Outpatient Medications  Medication Instructions   acetaminophen (TYLENOL) 500 mg, Oral, Every 6 hours PRN   albuterol (VENTOLIN HFA) 108 (90  Base) MCG/ACT inhaler 2 puffs, Inhalation, Every 6 hours PRN   amLODipine (NORVASC) 10 MG tablet TAKE 1 TABLET DAILY   aspirin 81 mg, Oral, Every morning   atorvastatin (LIPITOR) 20 MG tablet TAKE 1 TABLET AT BEDTIME   budesonide (PULMICORT) 0.5 mg, Nebulization, 2 times daily   calcium-vitamin D (OSCAL WITH D) 500-200 MG-UNIT tablet 1 tablet, Oral, Every morning   carvedilol (COREG) 25 MG tablet TAKE 1 TABLET TWICE A DAY WITH MEALS   cholecalciferol (VITAMIN D3) 1,000 Units, Oral, Daily   dapagliflozin propanediol (FARXIGA) 10 mg, Oral, Daily before breakfast   diphenhydramine-acetaminophen (TYLENOL PM) 25-500 MG TABS tablet 1 tablet, Oral, At bedtime PRN   furosemide (LASIX) 40 MG tablet TAKE 2 TABLETS EVERY MORNING AND 1 TABLET LATE IN THE AFTERNOON   ipratropium-albuterol (DUONEB) 0.5-2.5 (3) MG/3ML SOLN 3 mLs, Nebulization, 2 times daily PRN   Lancets (ONETOUCH ULTRASOFT) lancets Check blood  sugars no more than twice daily   loratadine (CLARITIN) 10 mg, Oral, Daily   Multiple Vitamins-Minerals (MULTIVITAMINS THER. W/MINERALS) TABS tablet 1 tablet, Oral, Daily   nitroGLYCERIN (NITROSTAT) 0.4 mg, Sublingual, Every 5 min x3 PRN   ONETOUCH VERIO test strip USE TO CHECK BLOOD SUGAR NO MORE THAN TWICE A DAY   pantoprazole (PROTONIX) 40 mg, Oral, Daily before breakfast   sacubitril-valsartan (ENTRESTO) 24-26 MG 1 tablet, Oral, 2 times daily   tamsulosin (FLOMAX) 0.4 MG CAPS capsule TAKE 1 CAPSULE DAILY AFTER SUPPER   zolpidem (AMBIEN) 10 mg, Oral, Daily at bedtime   Zoster Vaccine Adjuvanted [facility]) injection 0.5 mLs, Intramuscular,  Once       Objective:   Physical Exam BP 126/68   Pulse 88   Temp 98.1 F (36.7 C) (Oral)   Resp 18   Ht 5\' 9"  (1.753 m)   Wt 189 lb 8 oz (86 kg)   SpO2 91%   BMI 27.98 kg/m  General:   Well developed, NAD, BMI noted. HEENT:  Normocephalic . Face symmetric, atraumatic Lungs:  Decreased breath sounds at bases Normal respiratory effort, no intercostal retractions, no accessory muscle use. Heart: Irregularly irregular  lower extremities: no pretibial edema bilaterally  Skin: Not pale. Not jaundice Neurologic:  alert & oriented X3.  Speech normal, gait appropriate for age and unassisted Psych--  Cognition and judgment appear intact.  Cooperative with normal attention span and concentration.  Behavior appropriate. No anxious or depressed appearing.      Assessment    Assessment  DM HTN Hyperlipidemia GERD Anxiety- insomnia  BPH Cardiovascular:Dr Lovena Le  --  Cath x 2 [id number]: no significant CAD -- CHF, non-ischemic  --Atrial fibrillation; h/o AV node ablation, has a Pacemaker (biventricular pacing) --h/o IC bleeding: Not anticoagulated --Peripheral vascular disease Pulmonary: Dr Melvyn Novas, last OV 10-18-2014, f/u prn --COPD, severe, chronic resp failure, night O2 prn, has portable O2  --Pleural effusion s/p  decortication --Pulmonary hypertension  Stasis dermatitis   PLAN: DM: Last A1c was 5.8, was rec to stop Januvia and continue with [place].  Check A1c HTN: BP is very good, last BMP okay, continue amlodipine, carvedilol, Lasix, Entresto. PAD, A-fib, CHF, pacemaker: Saw cardiology 03/09/2022, they noted he was doing well since he started Entresto (on 12/07/2021).  For A-fib, he is anticoagulated and on a biventricular pacing. COPD, oxygen therapy: Patient was unable to bring his portable oxygen, O2 sat 91% but he is in no distress.  Other than today reports good compliance with oxygen supplementation and nebulizations.  Denies any major problems with cough.  Preventive care: Encouraged Shingrix vaccine. RTC 47-month

## 2022-03-11 NOTE — Assessment & Plan Note (Signed)
DM: Last A1c was 5.8, was rec to stop Januvia and continue with [place].  Check A1c HTN: BP is very good, last BMP okay, continue amlodipine, carvedilol, Lasix, Entresto. PAD, A-fib, CHF, pacemaker: Saw cardiology 03/09/2022, they noted he was doing well since he started Entresto (on 12/07/2021).  For A-fib, he is anticoagulated and on a biventricular pacing. COPD, oxygen therapy: Patient was unable to bring his portable oxygen, O2 sat 91% but he is in no distress.  Other than today reports good compliance with oxygen supplementation and nebulizations.  Denies any major problems with cough. Preventive care: Encouraged Shingrix vaccine. RTC 4-month

## 2022-03-26 ENCOUNTER — Telehealth: Payer: Self-pay | Admitting: Pharmacist

## 2022-03-26 ENCOUNTER — Ambulatory Visit (INDEPENDENT_AMBULATORY_CARE_PROVIDER_SITE_OTHER): Payer: BLUE CROSS/BLUE SHIELD

## 2022-03-26 ENCOUNTER — Encounter: Payer: Self-pay | Admitting: Internal Medicine

## 2022-03-26 DIAGNOSIS — I428 Other cardiomyopathies: Secondary | ICD-10-CM

## 2022-03-26 NOTE — Telephone Encounter (Signed)
Patient reports he saw eye doctor 02/2022 but we have not received notes yet. [facility] which is where patient states he was seen but they had no records. [facility] which is also on [address] and they did see patient in May 2023. Requested copy of eye exam / visit to be faxed to our office.

## 2022-03-27 LAB — CUP PACEART REMOTE DEVICE CHECK
Battery Remaining Longevity: 4 mo
Battery Voltage: 2.82 V
Brady Statistic AP VP Percent: 0 %
Brady Statistic AP VS Percent: 0 %
Brady Statistic AS VP Percent: 98.62 %
Brady Statistic AS VS Percent: 1.38 %
Brady Statistic RA Percent Paced: 0 %
Brady Statistic RV Percent Paced: 98.87 %
Date Time Interrogation Session: 20230616120559
Implantable Lead Implant Date: 20121128
Implantable Lead Implant Date: 20121128
Implantable Lead Location: 753858
Implantable Lead Location: 753860
Implantable Lead Model: 4194
Implantable Lead Model: 5076
Implantable Pulse Generator Implant Date: 20121128
Lead Channel Impedance Value: 342 Ohm
Lead Channel Impedance Value: 399 Ohm
Lead Channel Impedance Value: 4047 Ohm
Lead Channel Impedance Value: 4047 Ohm
Lead Channel Impedance Value: 418 Ohm
Lead Channel Impedance Value: 437 Ohm
Lead Channel Impedance Value: 456 Ohm
Lead Channel Impedance Value: 532 Ohm
Lead Channel Impedance Value: 627 Ohm
Lead Channel Pacing Threshold Amplitude: 0.625 V
Lead Channel Pacing Threshold Amplitude: 1.25 V
Lead Channel Pacing Threshold Pulse Width: 0.4 ms
Lead Channel Pacing Threshold Pulse Width: 0.4 ms
Lead Channel Sensing Intrinsic Amplitude: 11.625 mV
Lead Channel Sensing Intrinsic Amplitude: 11.625 mV
Lead Channel Setting Pacing Amplitude: 2 V
Lead Channel Setting Pacing Amplitude: 2.25 V
Lead Channel Setting Pacing Pulse Width: 0.4 ms
Lead Channel Setting Pacing Pulse Width: 0.4 ms
Lead Channel Setting Sensing Sensitivity: 4 mV

## 2022-03-29 ENCOUNTER — Other Ambulatory Visit: Payer: Self-pay | Admitting: Internal Medicine

## 2022-04-01 ENCOUNTER — Ambulatory Visit: Payer: Self-pay

## 2022-04-01 DIAGNOSIS — J449 Chronic obstructive pulmonary disease, unspecified: Secondary | ICD-10-CM

## 2022-04-01 DIAGNOSIS — I5032 Chronic diastolic (congestive) heart failure: Secondary | ICD-10-CM

## 2022-04-01 NOTE — Patient Instructions (Signed)
Visit Information  Thank you for allowing me to share the care management and care coordination services that are available to you as part of your health plan and services through your primary care provider and medical home. Please reach out to me at [phone] if the care management/care coordination team may be of assistance to you in the future.   Lera Gaines, RN, MSN, BSN, CCM Care Management Coordinator [facility] [phone]  

## 2022-04-01 NOTE — Progress Notes (Signed)
Remote pacemaker transmission.   

## 2022-04-01 NOTE — Chronic Care Management (AMB) (Addendum)
Chronic Care Management   CCM RN Visit Note  04/01/2022 Name: Bradley TANKARD Sr. MRN: [id number] DOB: [birth year]  Subjective: Bradley Jock Sr. is a 81 y.o. year old male who is a primary care patient of Colon Branch, MD. The care management team was consulted for assistance with disease management and care coordination needs.    Engaged with patient by telephone for follow up visit in response to provider referral for case management and/or care coordination services.   Consent to Services:  The patient was given information about Chronic Care Management services, agreed to services, and gave verbal consent prior to initiation of services.  Please see initial visit note for detailed documentation.   Patient agreed to services and verbal consent obtained.   Assessment: Review of patient past medical history, allergies, medications, health status, including review of consultants reports, laboratory and other test data, was performed as part of comprehensive evaluation and provision of chronic care management services.   SDOH (Social Determinants of Health) assessments and interventions performed:    CCM Care Plan  Allergies  Allergen Reactions   Hydrocodone Other (See Comments)    "given to him in the hospital; went thru withdrawals once home; dr said not to take it again" (09/27/2012)   Tramadol Other (See Comments)    "given to him in the hospital; went thru withdrawals once home; dr said not to take it again" (09/27/2012)   Tadalafil Other (See Comments)    headache, backache   Avelox [Moxifloxacin Hydrochloride] Itching and Other (See Comments)    Headache    Outpatient Encounter Medications as of 04/01/2022  Medication Sig Note   acetaminophen (TYLENOL) 500 MG tablet Take 500 mg by mouth every 6 (six) hours as needed for headache or moderate pain.    albuterol (VENTOLIN HFA) 108 (90 Base) MCG/ACT inhaler Inhale 2 puffs into the lungs every 6 (six) hours as needed for wheezing or  shortness of breath.    amLODipine (NORVASC) 10 MG tablet TAKE 1 TABLET DAILY    aspirin 81 MG tablet Take 81 mg by mouth every morning.     atorvastatin (LIPITOR) 20 MG tablet TAKE 1 TABLET AT BEDTIME    budesonide (PULMICORT) 0.5 MG/2ML nebulizer solution Take 2 mLs (0.5 mg total) by nebulization 2 (two) times daily.    calcium-vitamin D (OSCAL WITH D) 500-200 MG-UNIT tablet Take 1 tablet by mouth every morning.    carvedilol (COREG) 25 MG tablet TAKE 1 TABLET TWICE A DAY WITH MEALS    cholecalciferol (VITAMIN D3) 25 MCG (1000 UNIT) tablet Take 1,000 Units by mouth daily.    diphenhydramine-acetaminophen (TYLENOL PM) 25-500 MG TABS tablet Take 1 tablet by mouth at bedtime as needed.    FARXIGA 10 MG TABS tablet TAKE 1 TABLET DAILY BEFORE BREAKFAST    furosemide (LASIX) 40 MG tablet TAKE 2 TABLETS EVERY MORNING AND 1 TABLET LATE IN THE AFTERNOON    ipratropium-albuterol (DUONEB) 0.5-2.5 (3) MG/3ML SOLN Take 3 mLs by nebulization 2 (two) times daily as needed.    Lancets (ONETOUCH ULTRASOFT) lancets Check blood sugars no more than twice daily    loratadine (CLARITIN) 10 MG tablet Take 1 tablet (10 mg total) by mouth daily.    Multiple Vitamins-Minerals (MULTIVITAMINS THER. W/MINERALS) TABS tablet Take 1 tablet by mouth daily.    nitroGLYCERIN (NITROSTAT) 0.4 MG SL tablet Place 1 tablet (0.4 mg total) under the tongue every 5 (five) minutes x 3 doses as needed for chest pain. (Patient  not taking: Reported on 03/11/2022) 09/14/2021: PRN   ONETOUCH VERIO test strip USE TO CHECK BLOOD SUGAR NO MORE THAN TWICE A DAY    pantoprazole (PROTONIX) 40 MG tablet Take 1 tablet (40 mg total) by mouth daily before breakfast.    sacubitril-valsartan (ENTRESTO) 24-26 MG Take 1 tablet by mouth 2 (two) times daily.    tamsulosin (FLOMAX) 0.4 MG CAPS capsule TAKE 1 CAPSULE DAILY AFTER SUPPER    zolpidem (AMBIEN) 10 MG tablet Take 10 mg by mouth at bedtime.    No facility-administered encounter medications on file as  of 04/01/2022.    Patient Active Problem List   Diagnosis Date Noted   GERD without esophagitis 07/30/2021   Cor pulmonale (chronic) ([place]) 08/05/2020   COPD exacerbation ([place]) 12/22/2018   COPD GOLD 0 spirometry/ AB  12/22/2018   PCP NOTES >>> 07/15/2015   Annual physical exam 05/08/2012   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   Sinoatrial node dysfunction (HCC)    Non-ischemic cardiomyopathy ([place]) 09/06/2011   Chronic diastolic heart failure (H[facility])    PAD (peripheral artery disease) ([place]) 10/22/2010   DERMATITIS, STASIS 09/14/2010   Chronic respiratory failure with hypoxia and hypercapnia ([place]) 09/14/2010   Chronic pulmonary hypertension secondary to elevated L H pressures  04/15/2009   ERECTILE DYSFUNCTION 01/06/2009   Mixed diabetic hyperlipidemia associated with type 2 diabetes mellitus ([place]) 12/13/2007   Essential hypertension 12/13/2007   Anxiety--insomnia 08/14/2007   Type 2 diabetes mellitus without complication, without long-term current use of insulin ([place]) 01/02/2007   Chronic atrial fibrillation ([place]) 01/02/2007   Benign enlargement of prostate 01/02/2007    Conditions to be addressed/monitored:HTN and COPD  Care Plan : RN Care Manager Plan of Care  Updates made by Luretha Rued, RN since 04/01/2022 12:00 AM  Completed 04/01/2022   Problem: No Plan of Care Established for Management of Chronic Disease (COPD, CHF) Resolved 04/01/2022  Priority: High     Long-Range Goal: Development of Plan of Care for Chronic Disease (COPD, CHF) Completed 04/01/2022  Start Date: 08/17/2021  Expected End Date: 02/14/2022  Recent Progress: On track  Priority: High  Note:   Goals Met-Case Closed Current Barriers:  Chronic Disease Management support and education needs related to CHF and COPD 03/09/22 Mr. Dahlen reports doing well. He reports a follow up visit with cardiologist completed today. He denies any signs/symptoms of COPD exacerbation. He reports he is taking medications as  prescribed. Weight today at cardiology visit 194 pounds; weight at cardiology visit on 12/07/21 192 pounds.  He denies any questions or concerns.  RNCM Clinical Goal(s):  Patient will verbalize basic understanding of  CHF, COPD, and DMII disease process and self health management plan . attend all scheduled medical appointments:   demonstrate Ongoing adherence to prescribed treatment plan for CHF, COPD, and DMII as evidenced by taking medications as prescribed, attending provider visits as scheduled, calling providers with questions or concerns as needed. work with pharmacist to address medications/disease managment related toCHF, COPD, and DMII through collaboration with Consulting civil engineer, provider, and care team.   Interventions: 1:1 collaboration with primary care provider regarding development and update of comprehensive plan of care as evidenced by provider attestation and co-signature Inter-disciplinary care team collaboration (see longitudinal plan of care) Evaluation of current treatment plan related to  self management and patient's adherence to plan as established by provider  Diabetes Interventions: Goal Met: Yes Long term Assessed patient's understanding of A1c goal: <7% Reviewed medications with patient and discussed importance of medication  adherence Reinforced goal FBS 80-130 and <180 after eating Reviewed upcoming/scheduled appointments Provided positive feedback to continue self health management  Lab Results  Component Value Date   HGBA1C 5.8 11/11/2021  COPD Interventions: Goal Met: Yes. Long Term Advised patient to track and manage COPD triggers Encouraged to continue to attend provider visit as scheduled Reviewed medications and encouraged to continue to take medications as recommended Encouraged to maintain activities as tolerated  Heart Failure Interventions: Goal Met: Yes. Long Term Reiterated signs/symptoms of heart failure exacerbation and when to call the  doctor Encouraged to continue to take medications as prescribed Upcoming/scheduled appointments reviewed with patient Provided positive feedback regarding self health management Encouraged to continue to be as active as possible  Patient Goals/Self-Care Activities: Call pharmacy for medication refills 3-7 days in advance of running out of medications Call provider office for new concerns or questions  Continue to take all medications as prescribed Continue to eat healthy: lean protein, fruits, vegetables, healthy fats, monitor salt intake follow rescue plan if symptoms flare-up Continue to remain active as tolerated and per provider recommendation. Continue to check and record blood sugars as recommended: Goal is 80-130 before eating and less than 180 if check about 2 hours after eating Continue to attend provider visits as scheduled/recommended Call your provider for any health questions or concerns Continue to work with Care Management department for ongoing care management and/or care coordination needs     Plan:No further follow up required: Patient encouraged to contact Primary care Provider if any care management needs in the future   Thea Silversmith, RN, MSN, BSN, CCM Care Management Coordinator [place] [phone]   I have reviewed and agree with [facility] documentation.  Kathlene November, MD

## 2022-04-01 NOTE — Addendum Note (Signed)
Addended by: Elease Etienne A on: 04/01/2022 02:40 PM   Modules accepted: Level of Service

## 2022-04-05 ENCOUNTER — Ambulatory Visit (INDEPENDENT_AMBULATORY_CARE_PROVIDER_SITE_OTHER): Payer: Medicare Other

## 2022-04-05 DIAGNOSIS — Z0000 Encounter for general adult medical examination without abnormal findings: Secondary | ICD-10-CM | POA: Diagnosis not present

## 2022-04-05 NOTE — Progress Notes (Addendum)
Subjective:   [name] is a 81 y.o. male who presents for Medicare Annual/Subsequent preventive examination.  I connected with  [name] on 04/05/22 by a audio enabled telemedicine application and verified that I am speaking with the correct person using two identifiers.  Patient Location: Home  Provider Location: Office/Clinic  I discussed the limitations of evaluation and management by telemedicine. The patient expressed understanding and agreed to proceed.   Review of Systems     Cardiac Risk Factors include: advanced age (>65men, >38 women);diabetes mellitus;hypertension;dyslipidemia     Objective:    There were no vitals filed for this visit. There is no height or weight on file to calculate BMI.     04/05/2022   10:45 AM 08/29/2021    5:08 PM 07/29/2021    5:55 PM 04/02/2021    1:48 PM 03/06/2021    4:33 AM 03/05/2021   12:47 AM 03/21/2020    1:10 PM  Advanced Directives  Does Patient Have a Medical Advance Directive? Yes Yes Yes Yes  No Yes  Type of Estate agent of [place];Out of facility DNR (pink MOST or yellow form);Living will Living will;Healthcare Power of [employer] Power of [place];Living will Healthcare Power of [place];Living will   Healthcare Power of [place];Living will  Does patient want to make changes to medical advance directive? No - Patient declined      No - Patient declined  Copy of Healthcare Power of Attorney in Chart? No - copy requested   No - copy requested   No - copy requested  Would patient like information on creating a medical advance directive?     No - Patient declined      Current Medications (verified) Outpatient Encounter Medications as of 04/05/2022  Medication Sig   acetaminophen (TYLENOL) 500 MG tablet Take 500 mg by mouth every 6 (six) hours as needed for headache or moderate pain.   albuterol (VENTOLIN HFA) 108 (90 Base) MCG/ACT inhaler Inhale 2 puffs into the lungs every 6  (six) hours as needed for wheezing or shortness of breath.   amLODipine (NORVASC) 10 MG tablet TAKE 1 TABLET DAILY   aspirin 81 MG tablet Take 81 mg by mouth every morning.    atorvastatin (LIPITOR) 20 MG tablet TAKE 1 TABLET AT BEDTIME   budesonide (PULMICORT) 0.5 MG/2ML nebulizer solution Take 2 mLs (0.5 mg total) by nebulization 2 (two) times daily.   calcium-vitamin D (OSCAL WITH D) 500-200 MG-UNIT tablet Take 1 tablet by mouth every morning.   carvedilol (COREG) 25 MG tablet TAKE 1 TABLET TWICE A DAY WITH MEALS   cholecalciferol (VITAMIN D3) 25 MCG (1000 UNIT) tablet Take 1,000 Units by mouth daily.   diphenhydramine-acetaminophen (TYLENOL PM) 25-500 MG TABS tablet Take 1 tablet by mouth at bedtime as needed.   FARXIGA 10 MG TABS tablet TAKE 1 TABLET DAILY BEFORE BREAKFAST   furosemide (LASIX) 40 MG tablet TAKE 2 TABLETS EVERY MORNING AND 1 TABLET LATE IN THE AFTERNOON   ipratropium-albuterol (DUONEB) 0.5-2.5 (3) MG/3ML SOLN Take 3 mLs by nebulization 2 (two) times daily as needed.   Lancets (ONETOUCH ULTRASOFT) lancets Check blood sugars no more than twice daily   loratadine (CLARITIN) 10 MG tablet Take 1 tablet (10 mg total) by mouth daily.   Multiple Vitamins-Minerals (MULTIVITAMINS THER. W/MINERALS) TABS tablet Take 1 tablet by mouth daily.   nitroGLYCERIN (NITROSTAT) 0.4 MG SL tablet Place 1 tablet (0.4 mg total) under the tongue every 5 (  five) minutes x 3 doses as needed for chest pain. (Patient not taking: Reported on 03/11/2022)   ONETOUCH VERIO test strip USE TO CHECK BLOOD SUGAR NO MORE THAN TWICE A DAY   pantoprazole (PROTONIX) 40 MG tablet Take 1 tablet (40 mg total) by mouth daily before breakfast.   sacubitril-valsartan (ENTRESTO) 24-26 MG Take 1 tablet by mouth 2 (two) times daily.   tamsulosin (FLOMAX) 0.4 MG CAPS capsule TAKE 1 CAPSULE DAILY AFTER SUPPER   zolpidem (AMBIEN) 10 MG tablet Take 10 mg by mouth at bedtime.   No facility-administered encounter medications on file  as of 04/05/2022.    Allergies (verified) Hydrocodone, Tramadol, Tadalafil, and Avelox [moxifloxacin hydrochloride]   History: Past Medical History:  Diagnosis Date   Abnormal CT scan, chest 2012   CT chest, several lymphadenopathies. Sees pulmonary   Anemia    intermittent   Arthritis    "?back" (09/19/2018)   Atrial fibrillation (HCC)    s/p AV node ablation & BiV PPM implantation 09/08/11 (op dictation pending)   BPH (benign prostatic hyperplasia)    Saw Dr [name] 2004, normal renal u/s   Bronchitis 08/26/2017   CAD (coronary artery disease)    CHF (congestive heart failure) (HCC)    Thought primarily to be non-systolic although EF down (EF 18-29% 12/2010, down to 35-40% 09/05/11), cath 2008 with no CAD, nuclear study 07/2011 showing Small area of reversibility in the distal ant/lat wall the left ventricle suspicious for ischemia/septal wall HK but felt to be low risk  (per D/C Summary 07/2011)   Chronic bronchitis (HCC)    "get it ~ q yr"   Chronic diastolic heart failure (HCC)         Chronic respiratory failure with hypoxia and hypercapnia (HCC) 09/14/2010   Followed in Pulmonary clinic/ [facility]/ [place]  Started on 02 2lpm at discharge 09/23/11   - PFT's 10/28/2011  FEV1  1.40 (51%)  with ratio 70 and no better p B2 and DLCO 53 corrects to 101    - PFT's 10/18/2014    FEV1 1.71 (59%) with ratio 68 and no sign change p B2 and DLCO 53 corrects to 85  -  HC03   07/28/20  = 33  -  HCO3   03/17/21     = 31     Heart murmur    HLD (hyperlipidemia)    HTN (hypertension)    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   Pleural effusion 2008   S/p decortication   Pneumonia 08/02/2011   Pulmonary HTN (HCC)    per cath 2008   Type II diabetes mellitus (HCC) 1999   Past Surgical History:  Procedure  Laterality Date   BI-VENTRICULAR PACEMAKER INSERTION Left 09/08/2011   Procedure: BI-VENTRICULAR PACEMAKER INSERTION (CRT-P);  Surgeon: Marinus Maw, MD;  Location[facility] CATH LAB;  Service: Cardiovascular;  Laterality: Left;   CARDIAC CATHETERIZATION     "couple times; never had balloon or stent" (09/19/2018)   CATARACT EXTRACTION W/ INTRAOCULAR LENS  IMPLANT, BILATERAL Bilateral 08/2018   COLONOSCOPY  03/10/11   normal   INSERT / REPLACE / REMOVE PACEMAKER  09/08/11   pacemaker placement   LUNG DECORTICATION     PLEURAL SCARIFICATION  pneumothorax with fibrothorax  ~ 2010   TONSILLECTOMY     "as a kid"    Family History  Problem Relation Age of Onset   Diabetes Father    Coronary artery disease Father    Polycythemia Mother    Drug abuse Son    Breast cancer Maternal Aunt    Prostate cancer Neg Hx    Colon cancer Neg Hx    Social History   Socioeconomic History   Marital status: Married    Spouse name: [place]   Number of children: 1   Years of education: Not on file   Highest education level: Not on file  Occupational History   Occupation: retired Publishing rights manager: RETIRED  Tobacco Use   Smoking status: Former    Packs/day: 2.50    Years: 25.00    Total pack years: 62.50    Types: Cigarettes, Cigars    Quit date: 10/11/1980    Years since quitting: 41.5   Smokeless tobacco: Never  Vaping Use   Vaping Use: Never used  Substance and Sexual Activity   Alcohol use: Not Currently    Comment: 1 can of beer ever 4-5 months   Drug use: No   Sexual activity: Not Currently  Other Topics Concern   Not on file  Social History Narrative   Moved from [place]   He lives w/ his wife, [name]   1 son, substance abuse, passed away October 04, 2016         Social Determinants of Health   Financial Resource Strain: Low Risk  (02/16/2022)   Overall Financial Resource Strain (CARDIA)    Difficulty of Paying Living Expenses: Not hard at all  Food Insecurity: No  Food Insecurity (08/17/2021)   Hunger Vital Sign    Worried About Running Out of Food in the Last Year: Never true    Ran Out of Food in the Last Year: Never true  Transportation Needs: No Transportation Needs (11/19/2021)   PRAPARE - Administrator, Civil Service (Medical): No    Lack of Transportation (Non-Medical): No  Physical Activity: Insufficiently Active (10/16/2021)   Exercise Vital Sign    Days of Exercise per Week: 5 days    Minutes of Exercise per Session: 10 min  Stress: No Stress Concern Present (04/02/2021)   [employer] of Occupational Health - Occupational Stress Questionnaire    Feeling of Stress : Not at all  Social Connections: Moderately Integrated (04/02/2021)   Social Connection and Isolation Panel [NHANES]    Frequency of Communication with Friends and Family: More than three times a week    Frequency of Social Gatherings with Friends and Family: More than three times a week    Attends Religious Services: More than 4 times per year    Active Member of [employer] or Organizations: No    Attends Engineer, structural: Never    Marital Status: Married    Tobacco Counseling Counseling given: Not Answered   Clinical Intake:  Pre-visit preparation completed: Yes  Pain : No/denies pain     BMI - recorded: 27.98 Nutritional Status: BMI 25 -29 Overweight Nutritional Risks: None Diabetes: Yes CBG done?: No CBG resulted in Enter/ Edit results?: No Did pt. bring in CBG monitor from home?: No  How often do you need to have someone help you when you read instructions, pamphlets, or other written materials from your doctor or pharmacy?: 1 - Never  Diabetic?yes Nutrition Risk Assessment:  Has the patient had any N/V/D within the last 2 months?  No  Does the patient have any non-healing wounds?  No  Has the patient had any unintentional weight loss or weight gain?  No   Diabetes:  Is the patient diabetic?  Yes  If diabetic, was a CBG  obtained today?  No  Did the patient bring in their glucometer from home?  No  How often do you monitor your CBG's? Daily in morning.   Financial Strains and Diabetes Management:  Are you having any financial strains with the device, your supplies or your medication? No .  Does the patient want to be seen by Chronic Care Management for management of their diabetes?  No  Would the patient like to be referred to a Nutritionist or for Diabetic Management?  No   Diabetic Exams:  Diabetic Eye Exam: Completed 02/09/22 Diabetic Foot Exam: Overdue, Pt has been advised about the importance in completing this exam. Pt is scheduled for diabetic foot exam on N/A.   Interpreter Needed?: No  Information entered by :: Zamaya Rapaport   Activities of Daily Living    04/05/2022   10:48 AM  In your present state of health, do you have any difficulty performing the following activities:  Hearing? 0  Vision? 0  Difficulty concentrating or making decisions? 0  Walking or climbing stairs? 0  Dressing or bathing? 0  Doing errands, shopping? 0  Preparing Food and eating ? N  Using the Toilet? N  In the past six months, have you accidently leaked urine? N  Do you have problems with loss of bowel control? N  Managing your Medications? N  Managing your Finances? N  Housekeeping or managing your Housekeeping? N    Patient Care Team: [name], MD as PCP - General (Internal Medicine) [name], MD as PCP - Cardiology (Cardiology) Marinus Maw, MD as Consulting Physician (Cardiology) Nyoka Cowden, MD as Consulting Physician (Pulmonary Disease) Melvenia Beam, MD as Consulting Physician (Otolaryngology) Miston, Ritesh, OD (Optometry) Henrene Pastor, RPH-CPP (Pharmacist)  Indicate any recent Medical Services you may have received from other than [facility] providers in the past year (date may be approximate).     Assessment:   This is a routine wellness examination for  [facility].  Hearing/Vision screen No results found.  Dietary issues and exercise activities discussed: Current Exercise Habits: Home exercise routine, Type of exercise: walking, Time (Minutes): 30, Frequency (Times/Week): 7, Weekly Exercise (Minutes/Week): 210, Intensity: Mild, Exercise limited by: None identified   Goals Addressed               This Visit's Progress     Maintain current health (pt-stated)   On track      Depression Screen    04/05/2022   10:46 AM 09/14/2021    2:33 PM 04/02/2021    1:51 PM 12/09/2020    1:02 PM 03/21/2020    1:14 PM 11/26/2019   10:04 AM 09/11/2018    2:10 PM  PHQ 2/9 Scores  PHQ - 2 Score 0 0 0 0 0 0 0    Fall Risk    04/05/2022   10:46 AM 08/17/2021   10:23 AM 04/02/2021    1:50 PM 12/09/2020    1:02 PM 03/21/2020    1:12 PM  Fall Risk   Falls in the past year? 0 0 0 0 0  Number falls in past yr: 0  0 0 0  Injury with Fall? 0  0 0 0  Risk for fall due to : No Fall Risks      Follow up Falls evaluation completed  Falls prevention discussed  Education provided;Falls prevention discussed    FALL RISK PREVENTION PERTAINING TO THE HOME:  Any stairs in or around the home? Yes  If so, are there any without handrails? No  Home free of loose throw rugs in walkways, pet beds, electrical cords, etc? Yes  Adequate lighting in your home to reduce risk of falls? Yes   ASSISTIVE DEVICES UTILIZED TO PREVENT FALLS:  Life alert? No  Use of a cane, walker or w/c? No  Grab bars in the bathroom? Yes  Shower chair or bench in shower? Yes  Elevated toilet seat or a handicapped toilet? No   TIMED UP AND GO:  Was the test performed? No .    Cognitive Function:    09/07/2017    1:32 PM  MMSE - Mini Mental State Exam  Not completed: Refused        04/05/2022   10:52 AM  6CIT Screen  What Year? 0 points  What month? 0 points  What time? 0 points  Count back from 20 0 points  Months in reverse 0 points  Repeat phrase 6 points  Total Score 6  points    Immunizations Immunization History  Administered Date(s) Administered   Fluad Quad(high Dose 65+) 06/26/2019, 08/11/2020   Influenza Split 10/01/2011   Influenza Whole 08/14/2007, 07/11/2009, 09/14/2010   Influenza, High Dose Seasonal PF 09/14/2013, 07/15/2015, 07/09/2016, 07/05/2017, 08/03/2018   Influenza,inj,Quad PF,6+ Mos 07/05/2014   Influenza-Unspecified 08/11/2012, 08/11/2021   Janssen (J&J) SARS-COV-2 Vaccination 01/12/2020, 09/19/2020   PFIZER Comirnaty(Gray Top)Covid-19 Tri-Sucrose Vaccine 03/18/2021   Pfizer Covid-19 Vaccine Bivalent Booster 58yrs & up 08/11/2021   Pneumococcal Conjugate-13 03/12/2015   Pneumococcal Polysaccharide-23 07/11/2004, 03/30/2009, 11/01/2017, 05/11/2019   Td 03/12/2003   Tetanus 09/14/2013   Zoster Recombinat (Shingrix) 03/11/2022   Zoster, Live 04/20/2011    TDAP status: Up to date  Flu Vaccine status: Up to date  Pneumococcal vaccine status: Up to date  Covid-19 vaccine status: Completed vaccines  Qualifies for Shingles Vaccine? Yes   Zostavax completed No   Shingrix Completed?: No.    Education has been provided regarding the importance of this vaccine. Patient has been advised to call insurance company to determine out of pocket expense if they have not yet received this vaccine. Advised may also receive vaccine at local pharmacy or Health Dept. Verbalized acceptance and understanding.  Screening Tests Health Maintenance  Topic Date Due   FOOT EXAM  12/09/2021   COVID-19 Vaccine (5 - Additional dose for Janssen series) 12/09/2021   Zoster Vaccines- Shingrix (2 of 2) 05/06/2022   INFLUENZA VACCINE  05/11/2022   HEMOGLOBIN A1C  09/10/2022   OPHTHALMOLOGY EXAM  02/10/2023   TETANUS/TDAP  09/15/2023   Pneumonia Vaccine 70+ Years old  Completed   HPV VACCINES  Aged Out    Health Maintenance  Health Maintenance Due  Topic Date Due   FOOT EXAM  12/09/2021   COVID-19 Vaccine (5 - Additional dose for Janssen series)  12/09/2021    Colorectal cancer screening: No longer required.   Lung Cancer Screening: (Low Dose CT Chest recommended if Age 58-80 years, 30 pack-year currently smoking OR have quit w/in 15years.) does not qualify.   Lung Cancer Screening Referral: N/A  Additional Screening:  Hepatitis C Screening: does not qualify; Completed aged out   Vision Screening: Recommended annual ophthalmology exams  for early detection of glaucoma and other disorders of the eye. Is the patient up to date with their annual eye exam?  Yes  Who is the provider or what is the name of the office in which the patient attends annual eye exams? [facility] If pt is not established with a provider, would they like to be referred to a provider to establish care? No .   Dental Screening: Recommended annual dental exams for proper oral hygiene  Community Resource Referral / Chronic Care Management: CRR required this visit?  No   CCM required this visit?  No      Plan:     I have personally reviewed and noted the following in the patient's chart:   Medical and social history Use of alcohol, tobacco or illicit drugs  Current medications and supplements including opioid prescriptions. Patient is not currently taking opioid prescriptions. Functional ability and status Nutritional status Physical activity Advanced directives List of other physicians Hospitalizations, surgeries, and ER visits in previous 12 months Vitals Screenings to include cognitive, depression, and falls Referrals and appointments  In addition, I have reviewed and discussed with patient certain preventive protocols, quality metrics, and best practice recommendations. A written personalized care plan for preventive services as well as general preventive health recommendations were provided to patient.   Due to this being a telephonic visit, the after visit summary with patients personalized plan was offered to patient via mail or my-chart.  Patient would like to access on my-chart.  Salomon Mast Jacere Pangborn, CMA   04/05/2022   Nurse Notes: none  I have reviewed and agree with [facility] documentation.  [name], MD

## 2022-04-06 ENCOUNTER — Ambulatory Visit: Payer: BLUE CROSS/BLUE SHIELD

## 2022-04-26 ENCOUNTER — Ambulatory Visit (INDEPENDENT_AMBULATORY_CARE_PROVIDER_SITE_OTHER): Payer: BLUE CROSS/BLUE SHIELD

## 2022-04-26 DIAGNOSIS — I428 Other cardiomyopathies: Secondary | ICD-10-CM

## 2022-04-26 DIAGNOSIS — I5032 Chronic diastolic (congestive) heart failure: Secondary | ICD-10-CM

## 2022-04-27 LAB — CUP PACEART REMOTE DEVICE CHECK
Battery Remaining Longevity: 4 mo
Battery Voltage: 2.81 V
Brady Statistic AP VP Percent: 0 %
Brady Statistic AP VS Percent: 0 %
Brady Statistic AS VP Percent: 97.93 %
Brady Statistic AS VS Percent: 2.07 %
Brady Statistic RA Percent Paced: 0 %
Brady Statistic RV Percent Paced: 98.71 %
Date Time Interrogation Session: 20230717005406
Implantable Lead Implant Date: 20121128
Implantable Lead Implant Date: 20121128
Implantable Lead Location: 753858
Implantable Lead Location: 753860
Implantable Lead Model: 4194
Implantable Lead Model: 5076
Implantable Pulse Generator Implant Date: 20121128
Lead Channel Impedance Value: 342 Ohm
Lead Channel Impedance Value: 399 Ohm
Lead Channel Impedance Value: 4047 Ohm
Lead Channel Impedance Value: 4047 Ohm
Lead Channel Impedance Value: 418 Ohm
Lead Channel Impedance Value: 437 Ohm
Lead Channel Impedance Value: 475 Ohm
Lead Channel Impedance Value: 532 Ohm
Lead Channel Impedance Value: 665 Ohm
Lead Channel Pacing Threshold Amplitude: 0.625 V
Lead Channel Pacing Threshold Amplitude: 1.375 V
Lead Channel Pacing Threshold Pulse Width: 0.4 ms
Lead Channel Pacing Threshold Pulse Width: 0.4 ms
Lead Channel Sensing Intrinsic Amplitude: 11.625 mV
Lead Channel Sensing Intrinsic Amplitude: 11.625 mV
Lead Channel Setting Pacing Amplitude: 2 V
Lead Channel Setting Pacing Amplitude: 2.5 V
Lead Channel Setting Pacing Pulse Width: 0.4 ms
Lead Channel Setting Pacing Pulse Width: 0.4 ms
Lead Channel Setting Sensing Sensitivity: 4 mV

## 2022-05-17 ENCOUNTER — Other Ambulatory Visit: Payer: Self-pay | Admitting: Internal Medicine

## 2022-05-27 ENCOUNTER — Ambulatory Visit (INDEPENDENT_AMBULATORY_CARE_PROVIDER_SITE_OTHER): Payer: BLUE CROSS/BLUE SHIELD

## 2022-05-27 ENCOUNTER — Other Ambulatory Visit: Payer: Self-pay | Admitting: Internal Medicine

## 2022-05-27 DIAGNOSIS — I428 Other cardiomyopathies: Secondary | ICD-10-CM | POA: Diagnosis not present

## 2022-05-27 LAB — CUP PACEART REMOTE DEVICE CHECK
Battery Remaining Longevity: 3 mo
Battery Voltage: 2.8 V
Brady Statistic AP VP Percent: 0 %
Brady Statistic AP VS Percent: 0 %
Brady Statistic AS VP Percent: 97.96 %
Brady Statistic AS VS Percent: 2.04 %
Brady Statistic RA Percent Paced: 0 %
Brady Statistic RV Percent Paced: 99.01 %
Date Time Interrogation Session: 20230817113413
Implantable Lead Implant Date: 20121128
Implantable Lead Implant Date: 20121128
Implantable Lead Location: 753858
Implantable Lead Location: 753860
Implantable Lead Model: 4194
Implantable Lead Model: 5076
Implantable Pulse Generator Implant Date: 20121128
Lead Channel Impedance Value: 323 Ohm
Lead Channel Impedance Value: 399 Ohm
Lead Channel Impedance Value: 4047 Ohm
Lead Channel Impedance Value: 4047 Ohm
Lead Channel Impedance Value: 418 Ohm
Lead Channel Impedance Value: 437 Ohm
Lead Channel Impedance Value: 475 Ohm
Lead Channel Impedance Value: 513 Ohm
Lead Channel Impedance Value: 646 Ohm
Lead Channel Pacing Threshold Amplitude: 0.625 V
Lead Channel Pacing Threshold Amplitude: 1.25 V
Lead Channel Pacing Threshold Pulse Width: 0.4 ms
Lead Channel Pacing Threshold Pulse Width: 0.4 ms
Lead Channel Sensing Intrinsic Amplitude: 11.625 mV
Lead Channel Sensing Intrinsic Amplitude: 11.625 mV
Lead Channel Setting Pacing Amplitude: 2 V
Lead Channel Setting Pacing Amplitude: 2.25 V
Lead Channel Setting Pacing Pulse Width: 0.4 ms
Lead Channel Setting Pacing Pulse Width: 0.4 ms
Lead Channel Setting Sensing Sensitivity: 4 mV

## 2022-05-28 NOTE — Progress Notes (Signed)
Remote pacemaker transmission.   

## 2022-05-28 NOTE — Addendum Note (Signed)
Addended by: Geralyn Flash D on: 05/28/2022 05:03 PM   Modules accepted: Level of Service

## 2022-06-07 ENCOUNTER — Other Ambulatory Visit: Payer: Self-pay | Admitting: Internal Medicine

## 2022-06-08 ENCOUNTER — Telehealth: Payer: BLUE CROSS/BLUE SHIELD

## 2022-06-22 NOTE — Progress Notes (Signed)
Remote pacemaker transmission.   

## 2022-06-28 ENCOUNTER — Ambulatory Visit (INDEPENDENT_AMBULATORY_CARE_PROVIDER_SITE_OTHER): Payer: BLUE CROSS/BLUE SHIELD

## 2022-06-28 DIAGNOSIS — I428 Other cardiomyopathies: Secondary | ICD-10-CM

## 2022-06-28 DIAGNOSIS — I5032 Chronic diastolic (congestive) heart failure: Secondary | ICD-10-CM

## 2022-06-29 LAB — CUP PACEART REMOTE DEVICE CHECK
Battery Remaining Longevity: 3 mo
Battery Voltage: 2.79 V
Brady Statistic AP VP Percent: 0 %
Brady Statistic AP VS Percent: 0 %
Brady Statistic AS VP Percent: 98.73 %
Brady Statistic AS VS Percent: 1.27 %
Brady Statistic RA Percent Paced: 0 %
Brady Statistic RV Percent Paced: 98.29 %
Date Time Interrogation Session: 20230918021452
Implantable Lead Implant Date: 20121128
Implantable Lead Implant Date: 20121128
Implantable Lead Location: 753858
Implantable Lead Location: 753860
Implantable Lead Model: 4194
Implantable Lead Model: 5076
Implantable Pulse Generator Implant Date: 20121128
Lead Channel Impedance Value: 361 Ohm
Lead Channel Impedance Value: 4047 Ohm
Lead Channel Impedance Value: 4047 Ohm
Lead Channel Impedance Value: 418 Ohm
Lead Channel Impedance Value: 437 Ohm
Lead Channel Impedance Value: 437 Ohm
Lead Channel Impedance Value: 475 Ohm
Lead Channel Impedance Value: 570 Ohm
Lead Channel Impedance Value: 684 Ohm
Lead Channel Pacing Threshold Amplitude: 0.625 V
Lead Channel Pacing Threshold Amplitude: 1.25 V
Lead Channel Pacing Threshold Pulse Width: 0.4 ms
Lead Channel Pacing Threshold Pulse Width: 0.4 ms
Lead Channel Sensing Intrinsic Amplitude: 11.625 mV
Lead Channel Sensing Intrinsic Amplitude: 11.625 mV
Lead Channel Setting Pacing Amplitude: 2 V
Lead Channel Setting Pacing Amplitude: 2.25 V
Lead Channel Setting Pacing Pulse Width: 0.4 ms
Lead Channel Setting Pacing Pulse Width: 0.4 ms
Lead Channel Setting Sensing Sensitivity: 4 mV

## 2022-07-10 NOTE — Progress Notes (Signed)
Remote pacemaker transmission.   

## 2022-07-10 NOTE — Addendum Note (Signed)
Addended by: Douglass Rivers D on: 07/10/2022 07:15 PM   Modules accepted: Level of Service

## 2022-07-13 ENCOUNTER — Encounter: Payer: Self-pay | Admitting: Internal Medicine

## 2022-07-13 ENCOUNTER — Ambulatory Visit (INDEPENDENT_AMBULATORY_CARE_PROVIDER_SITE_OTHER): Payer: Medicare Other | Admitting: Internal Medicine

## 2022-07-13 VITALS — BP 126/76 | HR 73 | Temp 98.0°F | Resp 20 | Ht 69.0 in | Wt 188.2 lb

## 2022-07-13 DIAGNOSIS — I482 Chronic atrial fibrillation, unspecified: Secondary | ICD-10-CM

## 2022-07-13 DIAGNOSIS — E119 Type 2 diabetes mellitus without complications: Secondary | ICD-10-CM

## 2022-07-13 DIAGNOSIS — Z9189 Other specified personal risk factors, not elsewhere classified: Secondary | ICD-10-CM | POA: Diagnosis not present

## 2022-07-13 DIAGNOSIS — J9612 Chronic respiratory failure with hypercapnia: Secondary | ICD-10-CM

## 2022-07-13 DIAGNOSIS — R7989 Other specified abnormal findings of blood chemistry: Secondary | ICD-10-CM

## 2022-07-13 DIAGNOSIS — I10 Essential (primary) hypertension: Secondary | ICD-10-CM

## 2022-07-13 DIAGNOSIS — Z23 Encounter for immunization: Secondary | ICD-10-CM

## 2022-07-13 DIAGNOSIS — J9611 Chronic respiratory failure with hypoxia: Secondary | ICD-10-CM

## 2022-07-13 LAB — CBC WITH DIFFERENTIAL/PLATELET
Basophils Absolute: 0 10*3/uL (ref 0.0–0.1)
Basophils Relative: 0.6 % (ref 0.0–3.0)
Eosinophils Absolute: 0.1 10*3/uL (ref 0.0–0.7)
Eosinophils Relative: 1 % (ref 0.0–5.0)
HCT: 42.7 % (ref 39.0–52.0)
Hemoglobin: 14.2 g/dL (ref 13.0–17.0)
Lymphocytes Relative: 33.5 % (ref 12.0–46.0)
Lymphs Abs: 2.3 10*3/uL (ref 0.7–4.0)
MCHC: 33.3 g/dL (ref 30.0–36.0)
MCV: 92.1 fl (ref 78.0–100.0)
Monocytes Absolute: 0.6 10*3/uL (ref 0.1–1.0)
Monocytes Relative: 8.3 % (ref 3.0–12.0)
Neutro Abs: 3.9 10*3/uL (ref 1.4–7.7)
Neutrophils Relative %: 56.6 % (ref 43.0–77.0)
Platelets: 199 10*3/uL (ref 150.0–400.0)
RBC: 4.64 Mil/uL (ref 4.22–5.81)
RDW: 14.8 % (ref 11.5–15.5)
WBC: 6.9 10*3/uL (ref 4.0–10.5)

## 2022-07-13 LAB — BASIC METABOLIC PANEL
BUN: 13 mg/dL (ref 6–23)
CO2: 31 mEq/L (ref 19–32)
Calcium: 9 mg/dL (ref 8.4–10.5)
Chloride: 102 mEq/L (ref 96–112)
Creatinine, Ser: 0.78 mg/dL (ref 0.40–1.50)
GFR: 83.69 mL/min (ref >60.00)
Glucose, Bld: 105 mg/dL — ABNORMAL HIGH (ref 70–99)
Potassium: 4.7 mEq/L (ref 3.5–5.1)
Sodium: 142 mEq/L (ref 135–145)

## 2022-07-13 LAB — HEMOGLOBIN A1C: Hgb A1c MFr Bld: 6.5 % (ref 4.6–6.5)

## 2022-07-13 LAB — TSH: TSH: 0.95 u[IU]/mL (ref 0.35–5.50)

## 2022-07-13 NOTE — Progress Notes (Unsigned)
Subjective:    Patient ID: Bradley Sit., male    DOB: 10-17-1940, 81 y.o.   MRN: [id number]  DOS:  07/13/2022 Type of visit - description: Follow-up  Routine visit, in general feeling well. Oxygen was slightly low when he arrived to the office, he denies shortness of breath. No lower extremity edema or paresthesias Has a pain at the right trapezoid area, for few weeks.  No radiation to the arm, no paresthesias.  No neck pain per se.    Review of Systems See above   Past Medical History:  Diagnosis Date   Abnormal CT scan, chest 2012   CT chest, several lymphadenopathies. Sees pulmonary   Anemia    intermittent   Arthritis    "?back" (09/19/2018)   Atrial fibrillation (HCC)    s/p AV node ablation & BiV PPM implantation 09/08/11 (op dictation pending)   BPH (benign prostatic hyperplasia)    Saw Dr [name] 2004, normal renal u/s   Bronchitis 08/26/2017   CAD (coronary artery disease)    CHF (congestive heart failure) (HCC)    Thought primarily to be non-systolic although EF down (EF 77-41% 12/2010, down to 35-40% 09/05/11), cath 2008 with no CAD, nuclear study 07/2011 showing Small area of reversibility in the distal ant/lat wall the left ventricle suspicious for ischemia/septal wall HK but felt to be low risk  (per D/C Summary 07/2011)   Chronic bronchitis (HCC)    "get it ~ q yr"   Chronic diastolic heart failure (HCC)         Chronic respiratory failure with hypoxia and hypercapnia (HCC) 09/14/2010   Followed in Pulmonary clinic/ [facility]/ [facility]  Started on 02 2lpm at discharge 09/23/11   - PFT's 10/28/2011  FEV1  1.40 (51%)  with ratio 70 and no better p B2 and DLCO 53 corrects to 101    - PFT's 10/18/2014    FEV1 1.71 (59%) with ratio 68 and no sign change p B2 and DLCO 53 corrects to 85  -  HC03   07/28/20  = 33  -  HCO3   03/17/21     = 31     Heart murmur    HLD (hyperlipidemia)    HTN (hypertension)    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   Pleural effusion 2008   S/p decortication   Pneumonia 08/02/2011   Pulmonary HTN (HCC)    per cath 2008   Type II diabetes mellitus (HCC) 1999    Past Surgical History:  Procedure Laterality Date   BI-VENTRICULAR PACEMAKER INSERTION Left 09/08/2011   Procedure: BI-VENTRICULAR PACEMAKER INSERTION (CRT-P);  Surgeon: Marinus Maw, MD;  Location: [facility] CATH LAB;  Service: Cardiovascular;  Laterality: Left;   CARDIAC CATHETERIZATION     "couple times; never had balloon or stent" (09/19/2018)   CATARACT EXTRACTION W/ INTRAOCULAR LENS  IMPLANT, BILATERAL Bilateral 08/2018   COLONOSCOPY  03/10/11   normal   INSERT / REPLACE / REMOVE PACEMAKER  09/08/11   pacemaker placement   LUNG DECORTICATION     PLEURAL SCARIFICATION     pneumothorax with fibrothorax  ~ 2010   TONSILLECTOMY     "as a kid"     Current Outpatient Medications  Medication Instructions   acetaminophen (TYLENOL) 500 mg, Oral, Every 6 hours PRN   albuterol (VENTOLIN HFA) 108 (90 Base) MCG/ACT inhaler 2 puffs, Inhalation, Every 6 hours PRN   amLODipine (NORVASC) 10 mg, Oral, Daily   aspirin 81 mg, Oral, Every morning   atorvastatin (LIPITOR) 20 mg, Oral, Daily at bedtime   budesonide (PULMICORT) 0.5 mg, Nebulization, 2 times daily   calcium-vitamin D (OSCAL WITH D) 500-200 MG-UNIT tablet 1 tablet, Oral, Every morning   carvedilol (COREG) 25 MG tablet TAKE 1 TABLET TWICE A DAY WITH MEALS   cholecalciferol (VITAMIN D3) 1,000 Units, Oral, Daily   diphenhydramine-acetaminophen (TYLENOL PM) 25-500 MG TABS tablet 1 tablet, Oral, At bedtime PRN   FARXIGA 10 MG TABS tablet TAKE 1 TABLET DAILY BEFORE BREAKFAST   furosemide (LASIX) 40 MG tablet TAKE 2 TABLETS EVERY MORNING AND 1 TABLET LATE IN THE AFTERNOON   ipratropium-albuterol (DUONEB) 0.5-2.5 (3) MG/3ML SOLN  3 mLs, Nebulization, 2 times daily PRN   Lancets (ONETOUCH ULTRASOFT) lancets Check blood sugars no more than twice daily   loratadine (CLARITIN) 10 mg, Oral, Daily   losartan (COZAAR) 50 mg, Oral, Daily   Multiple Vitamins-Minerals (MULTIVITAMINS THER. W/MINERALS) TABS tablet 1 tablet, Oral, Daily   nitroGLYCERIN (NITROSTAT) 0.4 mg, Sublingual, Every 5 min x3 PRN   ONETOUCH VERIO test strip USE TO CHECK BLOOD SUGAR NO MORE THAN TWICE A DAY   pantoprazole (PROTONIX) 40 mg, Oral, Daily before breakfast   sacubitril-valsartan (ENTRESTO) 24-26 MG 1 tablet, Oral, 2 times daily   tamsulosin (FLOMAX) 0.4 mg, Oral, Daily after supper   zolpidem (AMBIEN) 10 mg, Oral, Daily at bedtime       Objective:   Physical Exam BP 126/76   Pulse 73   Temp 98 F (36.7 C) (Oral)   Resp 20   Ht 5\' 9"  (1.753 m)   Wt 188 lb 4 oz (85.4 kg)   SpO2 (!) 89% Comment: 3L/min  BMI 27.80 kg/m  General:   Well developed, NAD, BMI noted. HEENT:  Normocephalic . Face symmetric, atraumatic Neck: No TTP at the cervical spine, range of motion normal Lungs:  There is breath sounds Normal respiratory effort, no intercostal retractions, no accessory muscle use. Heart: Irregularly irregular DM foot exam: Skin is dry but otherwise normal, nails are very long.  Pedal pulses present.  Pinprick examination normal Skin: Not pale. Not jaundice Neurologic:  alert & oriented X3.  Speech normal, gait appropriate for age and unassisted Psych--  Cognition and judgment appear intact.  Cooperative with normal attention span and concentration.  Behavior appropriate. No anxious or depressed appearing.      Assessment     Assessment  DM HTN Hyperlipidemia GERD Anxiety- insomnia  BPH Cardiovascular:Dr Lovena Le  --  Cath x 2 [id number]: no significant CAD -- CHF, non-ischemic  --Atrial fibrillation; h/o AV node ablation, has a Pacemaker (biventricular pacing) --h/o IC bleeding: Not anticoagulated --Peripheral  vascular disease Pulmonary: Dr Melvyn Novas, last OV 10-18-2014, f/u prn --COPD, severe, chronic resp failure, night O2 prn, has portable O2  --Pleural effusion s/p decortication --Pulmonary hypertension  Stasis dermatitis   PLAN:  DM: Last A1c 6.1, was recommended to continue [place].  Recheck A1c.  Feet exam performed, no neuropathy, nails are very long, declined podiatry referral. HTN: BP today is very good, and to check BPs at home.  Continue amlodipine, carvedilol, Lasix, Entresto.  Check BMP. Hyperlipidemia: Well-controlled on atorvastatin Right shoulder pain: As described above, recommend heat and Tylenol. Med compliance: Patient  has medication list in his phone that does not coincide with the chart.  Will refer him to our clinical pharmacist.  In the meantime recommend to stick with the medication list provided today. COPD: O2 sat in the low side today, he feels well.  At home is typically 90 to 95% Preventive care: Flu shot today, to have Shingrix soon.  Recommend COVID booster. RTC 4 months  least losartan is listed however he is supposed to be on Entresto. 6-1 DM: Last A1c was 5.8, was rec to stop Januvia and continue with [place].  Check A1c HTN: BP is very good, last BMP okay, continue amlodipine, carvedilol, Lasix, Entresto. PAD, A-fib, CHF, pacemaker: Saw cardiology 03/09/2022, they noted he was doing well since he started Entresto (on 12/07/2021).  For A-fib, he is anticoagulated and on a biventricular pacing. COPD, oxygen therapy: Patient was unable to bring his portable oxygen, O2 sat 91% but he is in no distress.  Other than today reports good compliance with oxygen supplementation and nebulizations.  Denies any major problems with cough. Preventive care: Encouraged Shingrix vaccine. RTC 43-month

## 2022-07-13 NOTE — Patient Instructions (Addendum)
Vaccines I recommend:  Shingrix (shingles) Covid booster  Check the  blood pressure regularly BP GOAL is between 110/65 and  135/85. If it is consistently higher or lower, let me know  I will ask Tammy to check on you to be sure you are taking your medications as recommended.  Please follow the list provided today.    GO TO THE LAB : Get the blood work     [place], [place] back for a checkup in 4 months

## 2022-07-14 NOTE — Assessment & Plan Note (Signed)
DM: Last A1c 6.1, was recommended to continue [place].  Recheck A1c.  Feet exam performed, no neuropathy, nails are very long, declined podiatry referral. HTN: BP today is very good,  rec to check BPs at home.  Continue amlodipine, carvedilol, Lasix, Entresto.  Check BMP. Hyperlipidemia: Well-controlled on atorvastatin Right shoulder pain: As described above, recommend heat and Tylenol. Med compliance: Patient has medication list in his phone that does not coincide with the chart.  Will refer him to our clinical pharmacist.  In the meantime recommend to stick with the medication list provided today. COPD: O2 sat in the low side today, he feels well.  At home is typically 90 to 95% Preventive care: Flu shot today, to have Shingrix soon.  Recommend COVID booster. RTC 4 months

## 2022-07-15 ENCOUNTER — Telehealth: Payer: Self-pay | Admitting: *Deleted

## 2022-07-15 NOTE — Chronic Care Management (AMB) (Signed)
  Chronic Care Management   Note  07/15/2022 Name: SKYLIER KRETSCHMER Sr. MRN: [id number] DOB: 08/27/1941  [name] is a 81 y.o. year old male who is a primary care patient of Colon Branch, MD. I reached out to [place]. by phone today in response to a referral sent by Mr. BRAEDON SJOGREN Sr.'s PCP.  Mr. Masoud was given information about Chronic Care Management services today including:  CCM service includes personalized support from designated clinical staff supervised by his physician, including individualized plan of care and coordination with other care providers 24/7 contact phone numbers for assistance for urgent and routine care needs. Service will only be billed when office clinical staff spend 20 minutes or more in a month to coordinate care. Only one practitioner may furnish and bill the service in a calendar month. The patient may stop CCM services at any time (effective at the end of the month) by phone call to the office staff. The patient is responsible for co-pay (up to 20% after annual deductible is met) if co-pay is required by the individual health plan.   Patient agreed to services and verbal consent obtained.   Follow up plan: Telephone appointment with care management team member scheduled for: 07/21/2022  Julian Hy, [place] Direct Dial: [phone]

## 2022-07-15 NOTE — Chronic Care Management (AMB) (Signed)
  Chronic Care Management   Outreach Note  07/15/2022 Name: Bradley RIVENBURG Sr. MRN: [id number] DOB: 1941/05/09  Bradley Jock Sr. is a 81 y.o. year old male who is a primary care patient of Colon Branch, MD. I reached out to [place]. by phone today in response to a referral sent by [name]'s primary care provider.  An unsuccessful telephone outreach was attempted today. The patient was referred to the case management team for assistance with care management and care coordination.   Follow Up Plan: A HIPAA compliant phone message was left for the patient providing contact information and requesting a return call.   Julian Hy, [place] Direct Dial: [phone]

## 2022-07-16 ENCOUNTER — Ambulatory Visit (INDEPENDENT_AMBULATORY_CARE_PROVIDER_SITE_OTHER): Payer: Medicare Other | Admitting: Internal Medicine

## 2022-07-16 ENCOUNTER — Encounter: Payer: Self-pay | Admitting: Internal Medicine

## 2022-07-16 VITALS — BP 118/74 | HR 81 | Ht 68.0 in | Wt 189.0 lb

## 2022-07-16 DIAGNOSIS — I495 Sick sinus syndrome: Secondary | ICD-10-CM

## 2022-07-16 DIAGNOSIS — I482 Chronic atrial fibrillation, unspecified: Secondary | ICD-10-CM

## 2022-07-16 DIAGNOSIS — I10 Essential (primary) hypertension: Secondary | ICD-10-CM | POA: Insufficient documentation

## 2022-07-16 DIAGNOSIS — I110 Hypertensive heart disease with heart failure: Secondary | ICD-10-CM | POA: Diagnosis not present

## 2022-07-16 DIAGNOSIS — Z950 Presence of cardiac pacemaker: Secondary | ICD-10-CM | POA: Insufficient documentation

## 2022-07-16 DIAGNOSIS — I5033 Acute on chronic diastolic (congestive) heart failure: Secondary | ICD-10-CM | POA: Diagnosis not present

## 2022-07-16 LAB — CUP PACEART INCLINIC DEVICE CHECK
Battery Remaining Longevity: 3 mo
Battery Voltage: 2.79 V
Brady Statistic AP VP Percent: 0 %
Brady Statistic AP VS Percent: 0 %
Brady Statistic AS VP Percent: 98.77 %
Brady Statistic AS VS Percent: 1.23 %
Brady Statistic RA Percent Paced: 0 %
Brady Statistic RV Percent Paced: 99.26 %
Date Time Interrogation Session: 20231006150842
Implantable Lead Implant Date: 20121128
Implantable Lead Implant Date: 20121128
Implantable Lead Location: 753858
Implantable Lead Location: 753860
Implantable Lead Model: 4194
Implantable Lead Model: 5076
Implantable Pulse Generator Implant Date: 20121128
Lead Channel Impedance Value: 342 Ohm
Lead Channel Impedance Value: 399 Ohm
Lead Channel Impedance Value: 4047 Ohm
Lead Channel Impedance Value: 4047 Ohm
Lead Channel Impedance Value: 437 Ohm
Lead Channel Impedance Value: 437 Ohm
Lead Channel Impedance Value: 456 Ohm
Lead Channel Impedance Value: 513 Ohm
Lead Channel Impedance Value: 627 Ohm
Lead Channel Pacing Threshold Amplitude: 0.625 V
Lead Channel Pacing Threshold Amplitude: 1.375 V
Lead Channel Pacing Threshold Pulse Width: 0.4 ms
Lead Channel Pacing Threshold Pulse Width: 0.4 ms
Lead Channel Sensing Intrinsic Amplitude: 11.625 mV
Lead Channel Sensing Intrinsic Amplitude: 11.625 mV
Lead Channel Setting Pacing Amplitude: 2 V
Lead Channel Setting Pacing Amplitude: 2.5 V
Lead Channel Setting Pacing Pulse Width: 0.4 ms
Lead Channel Setting Pacing Pulse Width: 0.4 ms
Lead Channel Setting Sensing Sensitivity: 4 mV

## 2022-07-16 NOTE — Patient Instructions (Signed)
Medication Instructions:  Your physician recommends that you continue on your current medications as directed. Please refer to the Current Medication list given to you today.  *If you need a refill on your cardiac medications before your next appointment, please call your pharmacy*  Lab Work: None ordered.  If you have labs (blood work) drawn today and your tests are completely normal, you will receive your results only by: [place] (if you have MyChart) OR A paper copy in the mail If you have any lab test that is abnormal or we need to change your treatment, we will call you to review the results.  Testing/Procedures: None ordered.  Follow-Up: At [facility], you and your health needs are our priority.  As part of our continuing mission to provide you with exceptional heart care, we have created designated Provider Care Teams.  These Care Teams include your primary Cardiologist (physician) and Advanced Practice Providers (APPs -  Physician Assistants and Nurse Practitioners) who all work together to provide you with the care you need, when you need it.  We recommend signing up for the patient portal called "MyChart".  Sign up information is provided on this After Visit Summary.  MyChart is used to connect with patients for Virtual Visits (Telemedicine).  Patients are able to view lab/test results, encounter notes, upcoming appointments, etc.  Non-urgent messages can be sent to your provider as well.   To learn more about what you can do with MyChart, go to [email or web address].    Your next appointment:   1 year(s)  The format for your next appointment:   In Person  Provider:   Cristopher Peru, MD{or one of the following Advanced Practice Providers on your designated Care Team:   Tommye Standard, [place] Legrand Como "Jonni Sanger" Chalmers Cater, [place]  Remote monitoring is used to monitor your Pacemaker from home. This monitoring reduces the number of office visits required to check your device to  one time per year. It allows [place] to keep an eye on the functioning of your device to ensure it is working properly. You are scheduled for a device check from home on 07/29/22. You may send your transmission at any time that day. If you have a wireless device, the transmission will be sent automatically. After your physician reviews your transmission, you will receive a postcard with your next transmission date.  Important Information About Sugar

## 2022-07-16 NOTE — Progress Notes (Signed)
HPI [name] returns today for ongoing evaluation. He is a pleasant 81 yo man with peristent and uncontrolled atrial fib and LV dysfunction who underwent PPM insertion and AV node ablation 11 years ago. He has done well in the interim except for some COPD. He denies palpitations, sob or chest pressure. No edema. He is approaching ERI. Allergies  Allergen Reactions   Hydrocodone Other (See Comments)    "given to him in the hospital; went thru withdrawals once home; dr said not to take it again" (09/27/2012)   Tramadol Other (See Comments)    "given to him in the hospital; went thru withdrawals once home; dr said not to take it again" (09/27/2012)   Tadalafil Other (See Comments)    headache, backache   Avelox [Moxifloxacin Hydrochloride] Itching and Other (See Comments)    Headache     Current Outpatient Medications  Medication Sig Dispense Refill   acetaminophen (TYLENOL) 500 MG tablet Take 500 mg by mouth every 6 (six) hours as needed for headache or moderate pain.     albuterol (VENTOLIN HFA) 108 (90 Base) MCG/ACT inhaler Inhale 2 puffs into the lungs every 6 (six) hours as needed for wheezing or shortness of breath. 8.5 g 0   amLODipine (NORVASC) 10 MG tablet Take 1 tablet (10 mg total) by mouth daily. 90 tablet 1   aspirin 81 MG tablet Take 81 mg by mouth every morning.      atorvastatin (LIPITOR) 20 MG tablet Take 1 tablet (20 mg total) by mouth at bedtime. 90 tablet 1   budesonide (PULMICORT) 0.5 MG/2ML nebulizer solution Take 2 mLs (0.5 mg total) by nebulization 2 (two) times daily. 360 mL 1   calcium-vitamin D (OSCAL WITH D) 500-200 MG-UNIT tablet Take 1 tablet by mouth every morning.     carvedilol (COREG) 25 MG tablet TAKE 1 TABLET TWICE A DAY WITH MEALS 180 tablet 1   cholecalciferol (VITAMIN D3) 25 MCG (1000 UNIT) tablet Take 1,000 Units by mouth daily.     diphenhydramine-acetaminophen (TYLENOL PM) 25-500 MG TABS tablet Take 1 tablet by mouth at bedtime as needed.      FARXIGA 10 MG TABS tablet TAKE 1 TABLET DAILY BEFORE BREAKFAST 90 tablet 3   furosemide (LASIX) 40 MG tablet TAKE 2 TABLETS EVERY MORNING AND 1 TABLET LATE IN THE AFTERNOON 270 tablet 3   ipratropium-albuterol (DUONEB) 0.5-2.5 (3) MG/3ML SOLN Take 3 mLs by nebulization 2 (two) times daily as needed. 540 mL 1   Lancets (ONETOUCH ULTRASOFT) lancets Check blood sugars no more than twice daily 200 each 12   loratadine (CLARITIN) 10 MG tablet Take 1 tablet (10 mg total) by mouth daily.     Multiple Vitamins-Minerals (MULTIVITAMINS THER. W/MINERALS) TABS tablet Take 1 tablet by mouth daily. 30 each 5   nitroGLYCERIN (NITROSTAT) 0.4 MG SL tablet Place 1 tablet (0.4 mg total) under the tongue every 5 (five) minutes x 3 doses as needed for chest pain. 25 tablet 3   ONETOUCH VERIO test strip USE TO CHECK BLOOD SUGAR NO MORE THAN TWICE A DAY 200 strip 12   pantoprazole (PROTONIX) 40 MG tablet Take 1 tablet (40 mg total) by mouth daily before breakfast. 90 tablet 1   tamsulosin (FLOMAX) 0.4 MG CAPS capsule Take 1 capsule (0.4 mg total) by mouth daily after supper. 90 capsule 1   zolpidem (AMBIEN) 10 MG tablet Take 10 mg by mouth at bedtime.     No current facility-administered medications  for this visit.     Past Medical History:  Diagnosis Date   Abnormal CT scan, chest 2012   CT chest, several lymphadenopathies. Sees pulmonary   Anemia    intermittent   Arthritis    "?back" (09/19/2018)   Atrial fibrillation (HCC)    s/p AV node ablation & BiV PPM implantation 09/08/11 (op dictation pending)   BPH (benign prostatic hyperplasia)    Saw Dr Reece Agar 2004, normal renal u/s   Bronchitis 08/26/2017   CAD (coronary artery disease)    CHF (congestive heart failure) ([place])    Thought primarily to be non-systolic although EF down (EF 45-50% 12/2010, down to 35-40% 09/05/11), cath 2008 with no CAD, nuclear study 07/2011 showing Small area of reversibility in the distal ant/lat wall the left ventricle  suspicious for ischemia/septal wall HK but felt to be low risk  (per D/C Summary 07/2011)   Chronic bronchitis ([place])    "get it ~ q yr"   Chronic diastolic heart failure ([place])         Chronic respiratory failure with hypoxia and hypercapnia ([place]) 09/14/2010   Followed in Pulmonary clinic/ [facility]/ [facility]  Started on 02 2lpm at discharge 09/23/11   - PFT's 10/28/2011  FEV1  1.40 (51%)  with ratio 70 and no better p B2 and DLCO 53 corrects to 101    - PFT's 10/18/2014    FEV1 1.71 (59%) with ratio 68 and no sign change p B2 and DLCO 53 corrects to 85  -  HC03   07/28/20  = 33  -  HCO3   03/17/21     = 31     Heart murmur    HLD (hyperlipidemia)    HTN (hypertension)    ICB (intracranial bleed) ([place]) 06/2012   d/c coumadin permanently   Insomnia    Migraines    "very very rare"   On home oxygen therapy    "2L; 24/7" (09/19/2018)   Pacemaker    Peripheral vascular disease ([place])    ??   Pleural effusion 2008   S/p decortication   Pneumonia 08/02/2011   Pulmonary HTN ([place])    per cath 2008   Type II diabetes mellitus ([place]) 1999    ROS:   All systems reviewed and negative except as noted in the HPI.   Past Surgical History:  Procedure Laterality Date   BI-VENTRICULAR PACEMAKER INSERTION Left 09/08/2011   Procedure: BI-VENTRICULAR PACEMAKER INSERTION (CRT-P);  Surgeon: Evans Lance, MD;  Location: [facility] CATH LAB;  Service: Cardiovascular;  Laterality: Left;   CARDIAC CATHETERIZATION     "couple times; never had balloon or stent" (09/19/2018)   CATARACT EXTRACTION W/ INTRAOCULAR LENS  IMPLANT, BILATERAL Bilateral 08/2018   COLONOSCOPY  03/10/11   normal   INSERT / REPLACE / REMOVE PACEMAKER  09/08/11   pacemaker placement   LUNG DECORTICATION     PLEURAL SCARIFICATION     pneumothorax with fibrothorax  ~ 2010   TONSILLECTOMY     "as a kid"      Family History  Problem Relation Age of Onset   Diabetes Father    Coronary artery disease Father    Polycythemia Mother     Drug abuse Son    Breast cancer Maternal Aunt    Prostate cancer Neg Hx    Colon cancer Neg Hx      Social History   Socioeconomic History   Marital status: Married    Spouse name: [place]  Number of children: 1   Years of education: Not on file   Highest education level: Not on file  Occupational History   Occupation: retired Publishing rights manager: RETIRED  Tobacco Use   Smoking status: Former    Packs/day: 2.50    Years: 25.00    Total pack years: 62.50    Types: Cigarettes, Cigars    Quit date: 10/11/1980    Years since quitting: 41.7   Smokeless tobacco: Never  Vaping Use   Vaping Use: Never used  Substance and Sexual Activity   Alcohol use: Not Currently    Comment: 1 can of beer ever 4-5 months   Drug use: No   Sexual activity: Not Currently  Other Topics Concern   Not on file  Social History Narrative   Moved from [place]   He lives w/ his wife, [name]   1 son, substance abuse, passed away 2016/09/30         Social Determinants of Health   Financial Resource Strain: Low Risk  (02/16/2022)   Overall Financial Resource Strain (CARDIA)    Difficulty of Paying Living Expenses: Not hard at all  Food Insecurity: No Food Insecurity (08/17/2021)   Hunger Vital Sign    Worried About Running Out of Food in the Last Year: Never true    Ran Out of Food in the Last Year: Never true  Transportation Needs: No Transportation Needs (11/19/2021)   PRAPARE - Administrator, Civil Service (Medical): No    Lack of Transportation (Non-Medical): No  Physical Activity: Insufficiently Active (10/16/2021)   Exercise Vital Sign    Days of Exercise per Week: 5 days    Minutes of Exercise per Session: 10 min  Stress: No Stress Concern Present (04/02/2021)   [employer] of Occupational Health - Occupational Stress Questionnaire    Feeling of Stress : Not at all  Social Connections: Moderately Integrated (04/02/2021)   Social Connection and Isolation Panel  [NHANES]    Frequency of Communication with Friends and Family: More than three times a week    Frequency of Social Gatherings with Friends and Family: More than three times a week    Attends Religious Services: More than 4 times per year    Active Member of [employer] or Organizations: No    Attends Banker Meetings: Never    Marital Status: Married  Catering manager Violence: Not At Risk (04/02/2021)   Humiliation, Afraid, Rape, and Kick questionnaire    Fear of Current or Ex-Partner: No    Emotionally Abused: No    Physically Abused: No    Sexually Abused: No     BP 118/74   Pulse 81   Ht 5\' 8"  (1.727 m)   Wt 189 lb (85.7 kg)   BMI 28.74 kg/m   Physical Exam:  Well appearing NAD HEENT: Unremarkable Neck:  No JVD, no thyromegally Lymphatics:  No adenopathy Back:  No CVA tenderness Lungs:  Clear with no wheezes HEART:  Regular rate rhythm, no murmurs, no rubs, no clicks Abd:  soft, positive bowel sounds, no organomegally, no rebound, no guarding Ext:  2 plus pulses, no edema, no cyanosis, no clubbing Skin:  No rashes no nodules Neuro:  CN II through XII intact, motor grossly intact  EKG - atrial fib with ventricular pacing  DEVICE  Normal device function.  See PaceArt for details.   Assess/Plan:  1. Atrial fib - his rates are well controlled, s/p AV  node ablation.  2. BIV PPM - his medtronic biv PPM is working normally. Thresholds are good. 3. HTN - his bp is well controlled. He will maintain a low sodium diet. 4. Chronic diastolic heart failure - his symptoms are class 2. He will continue his current meds.   Sharlot Gowda Tyan Dy,MD

## 2022-07-18 ENCOUNTER — Other Ambulatory Visit: Payer: Self-pay

## 2022-07-18 ENCOUNTER — Encounter (HOSPITAL_COMMUNITY): Payer: Self-pay

## 2022-07-18 ENCOUNTER — Encounter (HOSPITAL_BASED_OUTPATIENT_CLINIC_OR_DEPARTMENT_OTHER): Payer: Self-pay | Admitting: Emergency Medicine

## 2022-07-18 ENCOUNTER — Inpatient Hospital Stay (HOSPITAL_BASED_OUTPATIENT_CLINIC_OR_DEPARTMENT_OTHER)
Admission: EM | Admit: 2022-07-18 | Discharge: 2022-07-23 | DRG: 291 | Disposition: A | Discharge status: Home / Self Care | Payer: Medicare Other | Attending: Family Medicine | Admitting: Family Medicine

## 2022-07-18 ENCOUNTER — Emergency Department (HOSPITAL_BASED_OUTPATIENT_CLINIC_OR_DEPARTMENT_OTHER): Payer: Medicare Other

## 2022-07-18 DIAGNOSIS — I10 Essential (primary) hypertension: Secondary | ICD-10-CM | POA: Diagnosis not present

## 2022-07-18 DIAGNOSIS — I110 Hypertensive heart disease with heart failure: Principal | ICD-10-CM | POA: Diagnosis present

## 2022-07-18 DIAGNOSIS — E785 Hyperlipidemia, unspecified: Secondary | ICD-10-CM | POA: Diagnosis not present

## 2022-07-18 DIAGNOSIS — J441 Chronic obstructive pulmonary disease with (acute) exacerbation: Secondary | ICD-10-CM | POA: Diagnosis not present

## 2022-07-18 DIAGNOSIS — Z20822 Contact with and (suspected) exposure to covid-19: Secondary | ICD-10-CM | POA: Diagnosis present

## 2022-07-18 DIAGNOSIS — I495 Sick sinus syndrome: Secondary | ICD-10-CM | POA: Diagnosis present

## 2022-07-18 DIAGNOSIS — E119 Type 2 diabetes mellitus without complications: Secondary | ICD-10-CM | POA: Diagnosis not present

## 2022-07-18 DIAGNOSIS — Z79899 Other long term (current) drug therapy: Secondary | ICD-10-CM

## 2022-07-18 DIAGNOSIS — I272 Pulmonary hypertension, unspecified: Secondary | ICD-10-CM | POA: Diagnosis not present

## 2022-07-18 DIAGNOSIS — Z7951 Long term (current) use of inhaled steroids: Secondary | ICD-10-CM

## 2022-07-18 DIAGNOSIS — Z8249 Family history of ischemic heart disease and other diseases of the circulatory system: Secondary | ICD-10-CM | POA: Diagnosis not present

## 2022-07-18 DIAGNOSIS — E1151 Type 2 diabetes mellitus with diabetic peripheral angiopathy without gangrene: Secondary | ICD-10-CM | POA: Diagnosis present

## 2022-07-18 DIAGNOSIS — Z7982 Long term (current) use of aspirin: Secondary | ICD-10-CM

## 2022-07-18 DIAGNOSIS — I2510 Atherosclerotic heart disease of native coronary artery without angina pectoris: Secondary | ICD-10-CM | POA: Diagnosis present

## 2022-07-18 DIAGNOSIS — I482 Chronic atrial fibrillation, unspecified: Secondary | ICD-10-CM | POA: Diagnosis not present

## 2022-07-18 DIAGNOSIS — N400 Enlarged prostate without lower urinary tract symptoms: Secondary | ICD-10-CM | POA: Diagnosis present

## 2022-07-18 DIAGNOSIS — Z803 Family history of malignant neoplasm of breast: Secondary | ICD-10-CM

## 2022-07-18 DIAGNOSIS — I4891 Unspecified atrial fibrillation: Secondary | ICD-10-CM | POA: Diagnosis not present

## 2022-07-18 DIAGNOSIS — J9622 Acute and chronic respiratory failure with hypercapnia: Secondary | ICD-10-CM | POA: Diagnosis present

## 2022-07-18 DIAGNOSIS — Z87891 Personal history of nicotine dependence: Secondary | ICD-10-CM

## 2022-07-18 DIAGNOSIS — Z950 Presence of cardiac pacemaker: Secondary | ICD-10-CM

## 2022-07-18 DIAGNOSIS — Z8673 Personal history of transient ischemic attack (TIA), and cerebral infarction without residual deficits: Secondary | ICD-10-CM

## 2022-07-18 DIAGNOSIS — I5033 Acute on chronic diastolic (congestive) heart failure: Secondary | ICD-10-CM | POA: Diagnosis not present

## 2022-07-18 DIAGNOSIS — B348 Other viral infections of unspecified site: Secondary | ICD-10-CM

## 2022-07-18 DIAGNOSIS — J9621 Acute and chronic respiratory failure with hypoxia: Secondary | ICD-10-CM | POA: Diagnosis not present

## 2022-07-18 DIAGNOSIS — Z9981 Dependence on supplemental oxygen: Secondary | ICD-10-CM

## 2022-07-18 DIAGNOSIS — J449 Chronic obstructive pulmonary disease, unspecified: Secondary | ICD-10-CM | POA: Diagnosis not present

## 2022-07-18 DIAGNOSIS — M7989 Other specified soft tissue disorders: Secondary | ICD-10-CM | POA: Diagnosis not present

## 2022-07-18 DIAGNOSIS — Z833 Family history of diabetes mellitus: Secondary | ICD-10-CM | POA: Diagnosis not present

## 2022-07-18 DIAGNOSIS — R0603 Acute respiratory distress: Secondary | ICD-10-CM | POA: Diagnosis not present

## 2022-07-18 LAB — BASIC METABOLIC PANEL
Anion gap: 6 (ref 5–15)
BUN: 13 mg/dL (ref 8–23)
CO2: 31 mmol/L (ref 22–32)
Calcium: 8.5 mg/dL — ABNORMAL LOW (ref 8.9–10.3)
Chloride: 103 mmol/L (ref 98–111)
Creatinine, Ser: 0.73 mg/dL (ref 0.61–1.24)
GFR, Estimated: >60 mL/min (ref >60)
Glucose, Bld: 149 mg/dL — ABNORMAL HIGH (ref 70–99)
Potassium: 4.2 mmol/L (ref 3.5–5.1)
Sodium: 140 mmol/L (ref 135–145)

## 2022-07-18 LAB — CBC WITH DIFFERENTIAL/PLATELET
Abs Immature Granulocytes: 0.03 10*3/uL (ref 0.00–0.07)
Basophils Absolute: 0 10*3/uL (ref 0.0–0.1)
Basophils Relative: 0 %
Eosinophils Absolute: 0.1 10*3/uL (ref 0.0–0.5)
Eosinophils Relative: 1 %
HCT: 45.7 % (ref 39.0–52.0)
Hemoglobin: 15.1 g/dL (ref 13.0–17.0)
Immature Granulocytes: 0 %
Lymphocytes Relative: 23 %
Lymphs Abs: 2.2 10*3/uL (ref 0.7–4.0)
MCH: 30.4 pg (ref 26.0–34.0)
MCHC: 33 g/dL (ref 30.0–36.0)
MCV: 92 fL (ref 80.0–100.0)
Monocytes Absolute: 0.7 10*3/uL (ref 0.1–1.0)
Monocytes Relative: 7 %
Neutro Abs: 6.6 10*3/uL (ref 1.7–7.7)
Neutrophils Relative %: 69 %
Platelets: 190 10*3/uL (ref 150–400)
RBC: 4.97 MIL/uL (ref 4.22–5.81)
RDW: 14.1 % (ref 11.5–15.5)
WBC: 9.7 10*3/uL (ref 4.0–10.5)
nRBC: 0 % (ref 0.0–0.2)

## 2022-07-18 LAB — RESP PANEL BY RT-PCR (FLU A&B, COVID) ARPGX2
Influenza A by PCR: NEGATIVE
Influenza B by PCR: NEGATIVE
SARS Coronavirus 2 by RT PCR: NEGATIVE

## 2022-07-18 LAB — BRAIN NATRIURETIC PEPTIDE: B Natriuretic Peptide: 269.7 pg/mL — ABNORMAL HIGH (ref 0.0–100.0)

## 2022-07-18 LAB — TROPONIN I (HIGH SENSITIVITY)
Troponin I (High Sensitivity): 7 ng/L (ref <18)
Troponin I (High Sensitivity): 6 ng/L (ref <18)

## 2022-07-18 MED ORDER — ONDANSETRON HCL 4 MG PO TABS: 4.0000 mg | ORAL_TABLET | Freq: Four times a day (QID) | ORAL | Status: DC | PRN

## 2022-07-18 MED ORDER — ACETAMINOPHEN 650 MG RE SUPP: 650.0000 mg | Freq: Four times a day (QID) | RECTAL | Status: DC | PRN

## 2022-07-18 MED ORDER — NITROGLYCERIN 0.4 MG SL SUBL: 0.4000 mg | SUBLINGUAL_TABLET | SUBLINGUAL | Status: DC | PRN

## 2022-07-18 MED ORDER — INSULIN ASPART 100 UNIT/ML IJ SOLN
0.0000 [IU] | Freq: Three times a day (TID) | INTRAMUSCULAR | Status: DC
  Administered 2022-07-19: 3 [IU] via SUBCUTANEOUS
  Administered 2022-07-19: 2 [IU] via SUBCUTANEOUS
  Administered 2022-07-19: 8 [IU] via SUBCUTANEOUS
  Administered 2022-07-20: 3 [IU] via SUBCUTANEOUS
  Administered 2022-07-20 (×2): 2 [IU] via SUBCUTANEOUS
  Administered 2022-07-21 – 2022-07-23 (×6): 3 [IU] via SUBCUTANEOUS

## 2022-07-18 MED ORDER — DOXYCYCLINE HYCLATE 100 MG PO TABS
100.0000 mg | ORAL_TABLET | Freq: Two times a day (BID) | ORAL | Status: AC
  Administered 2022-07-19 – 2022-07-23 (×10): 100 mg via ORAL
  Filled 2022-07-18 (×10): qty 1

## 2022-07-18 MED ORDER — METHYLPREDNISOLONE SODIUM SUCC 125 MG IJ SOLR
125.0000 mg | Freq: Once | INTRAMUSCULAR | Status: AC
  Administered 2022-07-18: 125 mg via INTRAVENOUS
  Filled 2022-07-18: qty 2

## 2022-07-18 MED ORDER — ACETAMINOPHEN 325 MG PO TABS
650.0000 mg | ORAL_TABLET | Freq: Four times a day (QID) | ORAL | Status: DC | PRN
  Administered 2022-07-19: 650 mg via ORAL
  Filled 2022-07-18: qty 2

## 2022-07-18 MED ORDER — METHYLPREDNISOLONE SODIUM SUCC 125 MG IJ SOLR
125.0000 mg | INTRAMUSCULAR | Status: AC
  Administered 2022-07-19: 125 mg via INTRAVENOUS
  Filled 2022-07-18: qty 2

## 2022-07-18 MED ORDER — ALBUTEROL SULFATE (2.5 MG/3ML) 0.083% IN NEBU
5.0000 mg | INHALATION_SOLUTION | Freq: Once | RESPIRATORY_TRACT | Status: AC
  Administered 2022-07-18: 5 mg via RESPIRATORY_TRACT

## 2022-07-18 MED ORDER — FUROSEMIDE 40 MG PO TABS: 80.0000 mg | ORAL_TABLET | Freq: Every day | ORAL | Status: DC

## 2022-07-18 MED ORDER — PREDNISONE 20 MG PO TABS
40.0000 mg | ORAL_TABLET | Freq: Every day | ORAL | Status: DC
  Administered 2022-07-20: 40 mg via ORAL
  Filled 2022-07-18 (×2): qty 2

## 2022-07-18 MED ORDER — ALBUTEROL SULFATE (2.5 MG/3ML) 0.083% IN NEBU
INHALATION_SOLUTION | RESPIRATORY_TRACT | Status: AC
  Filled 2022-07-18: qty 6

## 2022-07-18 MED ORDER — SODIUM CHLORIDE 0.9% FLUSH
3.0000 mL | Freq: Two times a day (BID) | INTRAVENOUS | Status: DC
  Administered 2022-07-19 – 2022-07-23 (×10): 3 mL via INTRAVENOUS

## 2022-07-18 MED ORDER — INSULIN ASPART 100 UNIT/ML IJ SOLN
0.0000 [IU] | Freq: Every day | INTRAMUSCULAR | Status: DC
  Administered 2022-07-20: 2 [IU] via SUBCUTANEOUS
  Administered 2022-07-21: 3 [IU] via SUBCUTANEOUS

## 2022-07-18 MED ORDER — SODIUM CHLORIDE 0.9% FLUSH: 3.0000 mL | INTRAVENOUS | Status: DC | PRN

## 2022-07-18 MED ORDER — SODIUM CHLORIDE 0.9 % IV SOLN: 250.0000 mL | INTRAVENOUS | Status: DC | PRN

## 2022-07-18 MED ORDER — ALBUTEROL SULFATE (2.5 MG/3ML) 0.083% IN NEBU
5.0000 mg | INHALATION_SOLUTION | Freq: Once | RESPIRATORY_TRACT | Status: AC
  Administered 2022-07-18: 5 mg via RESPIRATORY_TRACT
  Filled 2022-07-18: qty 6

## 2022-07-18 MED ORDER — ENOXAPARIN SODIUM 40 MG/0.4ML IJ SOSY
40.0000 mg | PREFILLED_SYRINGE | INTRAMUSCULAR | Status: DC
  Administered 2022-07-19 – 2022-07-23 (×5): 40 mg via SUBCUTANEOUS
  Filled 2022-07-18 (×5): qty 0.4

## 2022-07-18 MED ORDER — FUROSEMIDE 10 MG/ML IJ SOLN
60.0000 mg | Freq: Once | INTRAMUSCULAR | Status: AC
  Administered 2022-07-18: 60 mg via INTRAVENOUS
  Filled 2022-07-18: qty 6

## 2022-07-18 MED ORDER — ONDANSETRON HCL 4 MG/2ML IJ SOLN: 4.0000 mg | Freq: Four times a day (QID) | INTRAMUSCULAR | Status: DC | PRN

## 2022-07-18 MED ORDER — CARVEDILOL 25 MG PO TABS
25.0000 mg | ORAL_TABLET | Freq: Two times a day (BID) | ORAL | Status: DC
  Administered 2022-07-19 – 2022-07-23 (×9): 25 mg via ORAL
  Filled 2022-07-18 (×9): qty 1

## 2022-07-18 MED ORDER — PANTOPRAZOLE SODIUM 40 MG PO TBEC
40.0000 mg | DELAYED_RELEASE_TABLET | Freq: Every day | ORAL | Status: DC
  Administered 2022-07-19 – 2022-07-23 (×5): 40 mg via ORAL
  Filled 2022-07-18 (×5): qty 1

## 2022-07-18 MED ORDER — LEVALBUTEROL HCL 0.63 MG/3ML IN NEBU
0.6300 mg | INHALATION_SOLUTION | RESPIRATORY_TRACT | Status: DC | PRN
  Administered 2022-07-19 (×2): 0.63 mg via RESPIRATORY_TRACT
  Filled 2022-07-18 (×2): qty 3

## 2022-07-18 MED ORDER — FUROSEMIDE 40 MG PO TABS: 40.0000 mg | ORAL_TABLET | ORAL | Status: DC

## 2022-07-18 MED ORDER — ATORVASTATIN CALCIUM 20 MG PO TABS
20.0000 mg | ORAL_TABLET | Freq: Every day | ORAL | Status: DC
  Administered 2022-07-19 – 2022-07-22 (×5): 20 mg via ORAL
  Filled 2022-07-18 (×5): qty 1

## 2022-07-18 NOTE — Assessment & Plan Note (Signed)
SSI  A1C  

## 2022-07-18 NOTE — Progress Notes (Signed)
Transferring facility: [facility] ED Requesting provider: Dr. 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   He was initially noted to have O2 sats in the low 80s, but was not wearing his chronic 4 L continuous nasal cannula at the time.  Has some worsening of edema in the bilateral lower extremities, crackles on exam.  BNP reportedly elevated into the 200s.  Chest x-ray reportedly suggestive of pulmonary edema, without overt infiltrate.  Received a dose of Lasix 60 mg IV x1, solumedrol, duo nebulizer treatment.  Patient feels slightly better, but still significantly short of breath, with increased work of breathing noted.  Oxygen saturations on baseline 4 L noted to be greater than 90%, although he is desatting into the 80s with ambulation.  Not associated with hypotension.  Most recent blood pressure 118/62.  Per my discussions with the EDP, she suspects commendation of acute on chronic diastolic heart failure as well as acute COPD exacerbation.  Reportedly had hospitalization for very similar approximately 1 year ago.  Subsequently, I accepted this patient for transfer for inpatient admission to a PCU bed at [facility] or [facility] (first available) for further work-up and management of the above .       Check [email or web address] for on-call coverage.   Nursing staff, Please call [place] number on Amion as soon as patient's arrival, so appropriate admitting provider can evaluate the pt.     Babs Bertin, DO Hospitalist

## 2022-07-18 NOTE — ED Notes (Signed)
Initial O2 sat 81% on RA

## 2022-07-18 NOTE — ED Provider Notes (Signed)
[facility] EMERGENCY DEPARTMENT Provider Note   CSN: [id number] Arrival date & time: 07/18/22  1705     History  Chief Complaint  Patient presents with   Shortness of Breath   Chest Pain    Bradley [name] Sr. is a 81 y.o. male.  Patient is a 81 year old male who presents with shortness of breath.  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  He also has a history of COPD, CHF, hypertension, hyperlipidemia.  He previously was on Coumadin but had a intracranial hemorrhage and no longer is on anticoagulants.  He reports a 2-day history of worsening shortness of breath and wheezing.  He has had a little bit of rhinorrhea and coughing as well.  His cough is nonproductive.  He does have some intermittent chest pain in the center of his chest.  It is nonradiating.  Denies any leg swelling.  No fevers.  He said he got his flu shot last week at his primary care office.  He is on oxygen 4 L/min chronically.  He did present to the ED hypoxic with oxygen saturation of 81% on room air.  However he was not wearing his home oxygen at that time.       Home Medications Prior to Admission medications   Medication Sig Start Date End Date Taking? Authorizing Provider  acetaminophen (TYLENOL) 500 MG tablet Take 500 mg by mouth every 6 (six) hours as needed for headache or moderate pain.    [provider]  albuterol (VENTOLIN HFA) 108 (90 Base) MCG/ACT inhaler Inhale 2 puffs into the lungs every 6 (six) hours as needed for wheezing or shortness of breath. 03/11/21   Ghimire, Werner Lean, MD  amLODipine (NORVASC) 10 MG tablet Take 1 tablet (10 mg total) by mouth daily. 06/07/22   [name], MD  aspirin 81 MG tablet Take 81 mg by mouth every morning.     [provider]  atorvastatin (LIPITOR) 20 MG tablet Take 1 tablet (20 mg total) by mouth at bedtime. 06/07/22   [name], MD  budesonide (PULMICORT) 0.5 MG/2ML  nebulizer solution Take 2 mLs (0.5 mg total) by nebulization 2 (two) times daily. 11/26/19   [name], MD  calcium-vitamin D (OSCAL WITH D) 500-200 MG-UNIT tablet Take 1 tablet by mouth every morning.    [provider]  carvedilol (COREG) 25 MG tablet TAKE 1 TABLET TWICE A DAY WITH MEALS 05/17/22   [name], MD  cholecalciferol (VITAMIN D3) 25 MCG (1000 UNIT) tablet Take 1,000 Units by mouth daily.    [provider]  diphenhydramine-acetaminophen (TYLENOL PM) 25-500 MG TABS tablet Take 1 tablet by mouth at bedtime as needed.    [provider]  FARXIGA 10 MG TABS tablet TAKE 1 TABLET DAILY BEFORE BREAKFAST 03/29/22   [name], MD  furosemide (LASIX) 40 MG tablet TAKE 2 TABLETS EVERY MORNING AND 1 TABLET LATE IN THE AFTERNOON 02/26/22   [name], MD  ipratropium-albuterol (DUONEB) 0.5-2.5 (3) MG/3ML SOLN Take 3 mLs by nebulization 2 (two) times daily as needed. 11/26/19   [name], MD  Lancets [facility] ULTRASOFT) lancets Check blood sugars no more than twice daily 03/08/17   [name], MD  loratadine (CLARITIN) 10 MG tablet Take 1 tablet (10 mg total) by mouth daily. 08/04/21   Joseph Art, DO  Multiple Vitamins-Minerals (MULTIVITAMINS THER. W/MINERALS) TABS tablet  Take 1 tablet by mouth daily. 09/14/13   [name], MD  nitroGLYCERIN (NITROSTAT) 0.4 MG SL tablet Place 1 tablet (0.4 mg total) under the tongue every 5 (five) minutes x 3 doses as needed for chest pain. 09/07/21   [name], MD  [facility] VERIO test strip USE TO CHECK BLOOD SUGAR NO MORE THAN TWICE A DAY 02/09/21   [name], MD  pantoprazole (PROTONIX) 40 MG tablet Take 1 tablet (40 mg total) by mouth daily before breakfast. 03/05/22   [name], MD  tamsulosin (FLOMAX) 0.4 MG CAPS capsule Take 1 capsule (0.4 mg total) by mouth daily after supper. 05/27/22   [name], MD  zolpidem (AMBIEN) 10 MG tablet Take 10 mg by mouth at bedtime.    [provider]      Allergies     Hydrocodone, Tramadol, Tadalafil, and Avelox [moxifloxacin hydrochloride]    Review of Systems   Review of Systems  Constitutional:  Positive for fatigue. Negative for chills, diaphoresis and fever.  HENT:  Positive for congestion and rhinorrhea. Negative for sneezing.   Eyes: Negative.   Respiratory:  Positive for cough and shortness of breath. Negative for chest tightness.   Cardiovascular:  Positive for chest pain. Negative for leg swelling.  Gastrointestinal:  Negative for abdominal pain, blood in stool, diarrhea, nausea and vomiting.  Genitourinary:  Negative for difficulty urinating, flank pain, frequency and hematuria.  Musculoskeletal:  Negative for arthralgias and back pain.  Skin:  Negative for rash.  Neurological:  Negative for dizziness, speech difficulty, weakness, numbness and headaches.    Physical Exam Updated Vital Signs BP 127/78   Pulse 69   Temp (!) 97.5 F (36.4 C) (Tympanic)   Resp 14   Ht 5\' 8"  (1.727 m)   Wt 83.9 kg   SpO2 98%   BMI 28.13 kg/m  Physical Exam Constitutional:      General: He is in acute distress.     Appearance: He is well-developed.  HENT:     Head: Normocephalic and atraumatic.  Eyes:     Pupils: Pupils are equal, round, and reactive to light.  Cardiovascular:     Rate and Rhythm: Normal rate and regular rhythm.     Heart sounds: Normal heart sounds.  Pulmonary:     Effort: Tachypnea and accessory muscle usage present. No respiratory distress.     Breath sounds: Decreased breath sounds and wheezing present. No rales.  Chest:     Chest wall: No tenderness.  Abdominal:     General: Bowel sounds are normal.     Palpations: Abdomen is soft.     Tenderness: There is no abdominal tenderness. There is no guarding or rebound.  Musculoskeletal:        General: Normal range of motion.     Cervical back: Normal range of motion and neck supple.     Comments: No edema or calf tenderness  Lymphadenopathy:     Cervical: No cervical  adenopathy.  Skin:    General: Skin is warm and dry.     Findings: No rash.  Neurological:     Mental Status: He is alert and oriented to person, place, and time.     ED Results / Procedures / Treatments   Labs (all labs ordered are listed, but only abnormal results are displayed) Labs Reviewed  BASIC METABOLIC PANEL - Abnormal; Notable for the following components:      Result Value   Glucose, Bld  149 (*)    Calcium 8.5 (*)    All other components within normal limits  BRAIN NATRIURETIC PEPTIDE - Abnormal; Notable for the following components:   B Natriuretic Peptide 269.7 (*)    All other components within normal limits  RESP PANEL BY RT-PCR (FLU A&B, COVID) ARPGX2  CBC WITH DIFFERENTIAL/PLATELET  TROPONIN I (HIGH SENSITIVITY)  TROPONIN I (HIGH SENSITIVITY)    EKG EKG Interpretation  Date/Time:  Sunday July 18 2022 17:17:13 EDT Ventricular Rate:  75 PR Interval:  76 QRS Duration: 149 QT Interval:  451 QTC Calculation: 504 R Axis:   242 Text Interpretation: VENTRICULAR PACED RHYTHM Confirmed by Malvin Johns [phone]) on 07/18/2022 7:03:06 PM  Radiology DG Chest Port 1 View  Result Date: 07/18/2022 CLINICAL DATA:  Cough and shortness of breath. Respiratory distress. EXAM: PORTABLE CHEST 1 VIEW COMPARISON:  Two-view chest x-ray 11/11/2021 FINDINGS: Heart is enlarged. Pacing wires are in place. Atherosclerotic calcifications are present in the aortic arch. Diffuse interstitial pattern is increased, suggesting edema superimposed on chronic interstitial changes. No focal airspace consolidation is present. No definite effusions are present. IMPRESSION: 1. Cardiomegaly and increased diffuse interstitial pattern compatible with congestive heart failure. 2. Aortic atherosclerosis. Electronically Signed   By: San Morelle M.D.   On: 07/18/2022 17:45    Procedures Procedures    Medications Ordered in ED Medications  albuterol (PROVENTIL) (2.5 MG/3ML) 0.083% nebulizer  solution (  Not Given 07/18/22 1725)  albuterol (PROVENTIL) (2.5 MG/3ML) 0.083% nebulizer solution 5 mg (5 mg Nebulization Given 07/18/22 1721)  methylPREDNISolone sodium succinate (SOLU-MEDROL) 125 mg/2 mL injection 125 mg (125 mg Intravenous Given 07/18/22 1725)  furosemide (LASIX) injection 60 mg (60 mg Intravenous Given 07/18/22 1907)  albuterol (PROVENTIL) (2.5 MG/3ML) 0.083% nebulizer solution 5 mg (5 mg Nebulization Given 07/18/22 1922)    ED Course/ Medical Decision Making/ A&P                           Medical Decision Making Amount and/or Complexity of Data Reviewed Labs: ordered. Radiology: ordered.  Risk Prescription drug management. Decision regarding hospitalization.   Patient is a 81 year old who presents with shortness of breath.  He has a history of COPD and CHF.  He has some wheezing and crackles on exam with a ongoing cough.  He does have hypoxia.  He is chronically on 4 L/min of oxygen.  His oxygen saturation did improve after he was placed on his home oxygen however has been fairly labile.  It drops whenever he moves around at all.  He was placed on humidified oxygen.  He was given nebulizer treatments and Solu-Medrol.  His chest x-ray shows some degree of pulmonary vascular congestion.  His BNP is a little bit elevated.  His troponins are negative.  He does not have ischemic changes on his EKG.  Doubt ACS.  Given dose of IV Lasix.  I suspect he has a combination of COPD and CHF exacerbations.  He is not febrile.  No suggestions of pneumonia.  His COVID test is negative.  He has had some improvement in the ED but still has some increase of breathing.  Patient was placed on BiPAP related to this and borderline oxygen saturations down into the low upper 80s.  I spoke with Dr. Eugenia Pancoast who has excepted the patient for admission to stepdown floor.  CRITICAL CARE Performed by: Malvin Johns Total critical care time: 70 minutes Critical care time was exclusive of separately  billable procedures and treating other patients. Critical care was necessary to treat or prevent imminent or life-threatening deterioration. Critical care was time spent personally by me on the following activities: development of treatment plan with patient and/or surrogate as well as nursing, discussions with consultants, evaluation of patient's response to treatment, examination of patient, obtaining history from patient or surrogate, ordering and performing treatments and interventions, ordering and review of laboratory studies, ordering and review of radiographic studies, pulse oximetry and re-evaluation of patient's condition.   Final Clinical Impression(s) / ED Diagnoses Final diagnoses:  COPD exacerbation (HCC)  Acute on chronic respiratory failure with hypoxia [facility])    Rx / DC Orders ED Discharge Orders     None         Rolan Bucco, MD 07/18/22 2213

## 2022-07-18 NOTE — ED Triage Notes (Signed)
Pt c/o CP, is in resp distress upon arrival to triage; normally on 4L O2 at home; did not use it en route;

## 2022-07-18 NOTE — Assessment & Plan Note (Signed)
Worsening hypoxia in setting of baseline chronic resp failure on 4L  Noted new onset wheezing and cough w/ rhinorrhea x 4-5 days  CXR w/ cardiomegaly and CHF pattern  BNP 270  Currently now on BIPAP  S/p 60mg  IV lasix in ER  Monitor UOP  Titrate home lasix regimen  IV solumedrol  Doxy Prn xopenex in setting of tachycardia  Follow closely

## 2022-07-18 NOTE — ED Notes (Signed)
ED Provider at bedside. 

## 2022-07-19 ENCOUNTER — Inpatient Hospital Stay (HOSPITAL_COMMUNITY): Payer: Medicare Other

## 2022-07-19 DIAGNOSIS — I4891 Unspecified atrial fibrillation: Secondary | ICD-10-CM | POA: Diagnosis not present

## 2022-07-19 DIAGNOSIS — I5033 Acute on chronic diastolic (congestive) heart failure: Secondary | ICD-10-CM | POA: Diagnosis not present

## 2022-07-19 DIAGNOSIS — J9621 Acute and chronic respiratory failure with hypoxia: Secondary | ICD-10-CM | POA: Diagnosis not present

## 2022-07-19 DIAGNOSIS — E119 Type 2 diabetes mellitus without complications: Secondary | ICD-10-CM

## 2022-07-19 DIAGNOSIS — I495 Sick sinus syndrome: Secondary | ICD-10-CM

## 2022-07-19 DIAGNOSIS — I2510 Atherosclerotic heart disease of native coronary artery without angina pectoris: Secondary | ICD-10-CM | POA: Diagnosis not present

## 2022-07-19 DIAGNOSIS — I10 Essential (primary) hypertension: Secondary | ICD-10-CM

## 2022-07-19 DIAGNOSIS — J449 Chronic obstructive pulmonary disease, unspecified: Secondary | ICD-10-CM

## 2022-07-19 LAB — COMPREHENSIVE METABOLIC PANEL
ALT: 14 U/L (ref 0–44)
AST: 19 U/L (ref 15–41)
Albumin: 3.4 g/dL — ABNORMAL LOW (ref 3.5–5.0)
Alkaline Phosphatase: 68 U/L (ref 38–126)
Anion gap: 9 (ref 5–15)
BUN: 16 mg/dL (ref 8–23)
CO2: 32 mmol/L (ref 22–32)
Calcium: 8.7 mg/dL — ABNORMAL LOW (ref 8.9–10.3)
Chloride: 100 mmol/L (ref 98–111)
Creatinine, Ser: 0.78 mg/dL (ref 0.61–1.24)
GFR, Estimated: >60 mL/min (ref >60)
Glucose, Bld: 173 mg/dL — ABNORMAL HIGH (ref 70–99)
Potassium: 4.2 mmol/L (ref 3.5–5.1)
Sodium: 141 mmol/L (ref 135–145)
Total Bilirubin: 0.9 mg/dL (ref 0.3–1.2)
Total Protein: 7.3 g/dL (ref 6.5–8.1)

## 2022-07-19 LAB — HEMOGLOBIN A1C
Hgb A1c MFr Bld: 6.2 % — ABNORMAL HIGH (ref 4.8–5.6)
Mean Plasma Glucose: 131.24 mg/dL

## 2022-07-19 LAB — BLOOD GAS, ARTERIAL
Acid-Base Excess: 7.6 mmol/L — ABNORMAL HIGH (ref 0.0–2.0)
Bicarbonate: 33.7 mmol/L — ABNORMAL HIGH (ref 20.0–28.0)
Delivery systems: POSITIVE
Drawn by: 308601
Expiratory PAP: 6 cmH2O
FIO2: 40 %
Inspiratory PAP: 12 cmH2O
O2 Saturation: 97.4 %
Patient temperature: 37
pCO2 arterial: 52 mmHg — ABNORMAL HIGH (ref 32–48)
pH, Arterial: 7.42 (ref 7.35–7.45)
pO2, Arterial: 89 mmHg (ref 83–108)

## 2022-07-19 LAB — RESPIRATORY PANEL BY PCR

## 2022-07-19 LAB — CBC
HCT: 45.5 % (ref 39.0–52.0)
Hemoglobin: 15.1 g/dL (ref 13.0–17.0)
MCH: 30.8 pg (ref 26.0–34.0)
MCHC: 33.2 g/dL (ref 30.0–36.0)
MCV: 92.9 fL (ref 80.0–100.0)
Platelets: 187 10*3/uL (ref 150–400)
RBC: 4.9 MIL/uL (ref 4.22–5.81)
RDW: 14 % (ref 11.5–15.5)
WBC: 9.9 10*3/uL (ref 4.0–10.5)
nRBC: 0 % (ref 0.0–0.2)

## 2022-07-19 LAB — GLUCOSE, CAPILLARY
Glucose-Capillary: 133 mg/dL — ABNORMAL HIGH (ref 70–99)
Glucose-Capillary: 161 mg/dL — ABNORMAL HIGH (ref 70–99)
Glucose-Capillary: 168 mg/dL — ABNORMAL HIGH (ref 70–99)
Glucose-Capillary: 169 mg/dL — ABNORMAL HIGH (ref 70–99)
Glucose-Capillary: 258 mg/dL — ABNORMAL HIGH (ref 70–99)

## 2022-07-19 MED ORDER — TORSEMIDE 20 MG PO TABS
40.0000 mg | ORAL_TABLET | Freq: Once | ORAL | Status: AC
  Administered 2022-07-19: 40 mg via ORAL
  Filled 2022-07-19 (×2): qty 2

## 2022-07-19 MED ORDER — FUROSEMIDE 40 MG PO TABS: 40.0000 mg | ORAL_TABLET | Freq: Every day | ORAL | Status: DC

## 2022-07-19 MED ORDER — LEVALBUTEROL HCL 0.63 MG/3ML IN NEBU
0.6300 mg | INHALATION_SOLUTION | Freq: Four times a day (QID) | RESPIRATORY_TRACT | Status: DC
  Administered 2022-07-19 – 2022-07-20 (×3): 0.63 mg via RESPIRATORY_TRACT
  Filled 2022-07-19 (×4): qty 3

## 2022-07-19 MED ORDER — FUROSEMIDE 40 MG PO TABS
40.0000 mg | ORAL_TABLET | Freq: Every day | ORAL | Status: DC
  Administered 2022-07-20 – 2022-07-23 (×4): 40 mg via ORAL
  Filled 2022-07-19 (×5): qty 1

## 2022-07-19 NOTE — Assessment & Plan Note (Signed)
S/p PPM  V paced rhythm in 70s on EKG  Follow

## 2022-07-19 NOTE — Progress Notes (Signed)
RT assessed patient and weaned the oxygen back down to 4L as he wears at home. Appears to be tolerating well with no distress at this time. RT will continue to monitor

## 2022-07-19 NOTE — Progress Notes (Signed)
Pt requesting break from BIPAP.  Pt placed on 4 LPM [place] and tolerating well at this time. RN aware.

## 2022-07-19 NOTE — Progress Notes (Signed)
RT was caloled up to room to assess patient. His oxygen saturation decreased on his 4L. The RN increased the patient to 15L [place] to get a saturation 92%. RT then gave neb treatment of xopenex. Patient appeared to tolerate well. RT will continue to monitor

## 2022-07-19 NOTE — Assessment & Plan Note (Signed)
BP stable  Titrate home regimen w/ diuresis

## 2022-07-19 NOTE — Assessment & Plan Note (Signed)
No active CP  Trop neg x2  Follow

## 2022-07-19 NOTE — Assessment & Plan Note (Signed)
Acute decompensated diastolic CHF  CXR w/ cardiomegaly and CHF pattern  BNP in 270s Fluid retention appears to be in intra-abdominal space  S/p 60mg  IV lasix  Will give dose of po torsemide x 1 in am  2D ECHO 10/22 w/ EF 55-60%

## 2022-07-19 NOTE — Assessment & Plan Note (Signed)
Rate controlled  S/p AV node ablation  Follow

## 2022-07-19 NOTE — H&P (Addendum)
History and Physical    Patient: Bradley [name] BOF:[id number] DOB: 04-30-1941 DOA: 07/18/2022 DOS: the patient was seen and examined on 07/19/2022 PCP: [name], MD  Patient coming from:  freestanding ER   Chief Complaint:  Chief Complaint  Patient presents with   Shortness of Breath   Chest Pain   HPI: Bradley MOREFIELD Sr. is a 81 y.o. male with medical history significant of chronic respiratory failure on 4 L, diastolic CHF, COPD, atrial fibrillation status post AV node ablation, prior history of intracranial bleeding not on anticoagulation, pulmonary hypertension, coronary artery disease hypertension, hyperlipidemia, type 2 diabetes presenting with acute on chronic respiratory failure with hypoxia.  Patient reports approximately 4 to 5 days of increased work of breathing, rhinorrhea cough nasal congestion as well as abdominal swelling.  No known sick contacts.  No chest pain.  Positive worsening cough or wheezing despite home inhalers.  Worsening shortness of breath despite 4 to 5 L at home.  Has been compliant with home diuretic regimen.  Does report some increased salt intake.  No abdominal pain, nausea or vomiting.  No hemiparesis or confusion.  Presented to the freestanding ER with O2 sats of 81% on room air.  Status post 60 mg IV Lasix.  IV Solu-Medrol as well as DuoNebs. Presented to [facility] afebrile, currently on BiPAP able to speak in full sentences to some degree.  BP 120s over 70s.  White count 9.7, hemoglobin 15.  Creatinine 0.7.  Troponin negative x2.  BNP 270s.  Chest x-ray with cardiomegaly and increased diffuse interstitial pattern concerning for CHF.  EKG with paced rhythm with rate in the 70s. Review of Systems: As mentioned in the history of present illness. All other systems reviewed and are negative. Past Medical History:  Diagnosis Date   Abnormal CT scan, chest 2012   CT chest, several lymphadenopathies. Sees pulmonary   Anemia    intermittent   Arthritis     "?back" (09/19/2018)   Atrial fibrillation (HCC)    s/p AV node ablation & BiV PPM implantation 09/08/11 (op dictation pending)   BPH (benign prostatic hyperplasia)    Saw Dr [name] 2004, normal renal u/s   Bronchitis 08/26/2017   CAD (coronary artery disease)    CHF (congestive heart failure) (HCC)    Thought primarily to be non-systolic although EF down (EF 77-82% 12/2010, down to 35-40% 09/05/11), cath 2008 with no CAD, nuclear study 07/2011 showing Small area of reversibility in the distal ant/lat wall the left ventricle suspicious for ischemia/septal wall HK but felt to be low risk  (per D/C Summary 07/2011)   Chronic bronchitis (HCC)    "get it ~ q yr"   Chronic diastolic heart failure (HCC)         Chronic respiratory failure with hypoxia and hypercapnia (HCC) 09/14/2010   Followed in Pulmonary clinic/ [facility]/ [place]  Started on 02 2lpm at discharge 09/23/11   - PFT's 10/28/2011  FEV1  1.40 (51%)  with ratio 70 and no better p B2 and DLCO 53 corrects to 101    - PFT's 10/18/2014    FEV1 1.71 (59%) with ratio 68 and no sign change p B2 and DLCO 53 corrects to 85  -  HC03   07/28/20  = 33  -  HCO3   03/17/21     = 31     Heart murmur    HLD (hyperlipidemia)    HTN (hypertension)    ICB (intracranial bleed) (  HCC) 06/2012   d/c coumadin permanently   Insomnia    Migraines    "very very rare"   On home oxygen therapy    "2L; 24/7" (09/19/2018)   Pacemaker    Peripheral vascular disease (HCC)    ??   Pleural effusion 2008   S/p decortication   Pneumonia 08/02/2011   Pulmonary HTN (HCC)    per cath 2008   Type II diabetes mellitus (HCC) 1999   Past Surgical History:  Procedure Laterality Date   BI-VENTRICULAR PACEMAKER INSERTION Left 09/08/2011   Procedure: BI-VENTRICULAR PACEMAKER INSERTION (CRT-P);  Surgeon: Marinus Maw, MD;  Location: [facility] CATH LAB;  Service: Cardiovascular;  Laterality: Left;   CARDIAC CATHETERIZATION     "couple times; never had balloon or stent"  (09/19/2018)   CATARACT EXTRACTION W/ INTRAOCULAR LENS  IMPLANT, BILATERAL Bilateral 08/2018   COLONOSCOPY  03/10/11   normal   INSERT / REPLACE / REMOVE PACEMAKER  09/08/11   pacemaker placement   LUNG DECORTICATION     PLEURAL SCARIFICATION     pneumothorax with fibrothorax  ~ 2010   TONSILLECTOMY     "as a kid"    Social History:  reports that he quit smoking about 41 years ago. His smoking use included cigarettes and cigars. He has a 62.50 pack-year smoking history. He has never used smokeless tobacco. He reports that he does not currently use alcohol. He reports that he does not use drugs.  Allergies  Allergen Reactions   Hydrocodone Other (See Comments)    "given to him in the hospital; went thru withdrawals once home; dr said not to take it again" (09/27/2012)   Tramadol Other (See Comments)    "given to him in the hospital; went thru withdrawals once home; dr said not to take it again" (09/27/2012)   Tadalafil Other (See Comments)    headache, backache   Avelox [Moxifloxacin Hydrochloride] Itching and Other (See Comments)    Headache    Family History  Problem Relation Age of Onset   Diabetes Father    Coronary artery disease Father    Polycythemia Mother    Drug abuse Son    Breast cancer Maternal Aunt    Prostate cancer Neg Hx    Colon cancer Neg Hx     Prior to Admission medications   Medication Sig Start Date End Date Taking? Authorizing Provider  acetaminophen (TYLENOL) 500 MG tablet Take 500 mg by mouth every 6 (six) hours as needed for headache or moderate pain.    [provider]  albuterol (VENTOLIN HFA) 108 (90 Base) MCG/ACT inhaler Inhale 2 puffs into the lungs every 6 (six) hours as needed for wheezing or shortness of breath. 03/11/21   Ghimire, Werner Lean, MD  amLODipine (NORVASC) 10 MG tablet Take 1 tablet (10 mg total) by mouth daily. 06/07/22   [name], MD  aspirin 81 MG tablet Take 81 mg by mouth every morning.     [provider]   atorvastatin (LIPITOR) 20 MG tablet Take 1 tablet (20 mg total) by mouth at bedtime. 06/07/22   [name], MD  budesonide (PULMICORT) 0.5 MG/2ML nebulizer solution Take 2 mLs (0.5 mg total) by nebulization 2 (two) times daily. 11/26/19   [name], MD  calcium-vitamin D (OSCAL WITH D) 500-200 MG-UNIT tablet Take 1 tablet by mouth every morning.    [provider]  carvedilol (COREG) 25 MG tablet TAKE 1 TABLET TWICE A DAY WITH MEALS 05/17/22  Colon Branch, MD  cholecalciferol (VITAMIN D3) 25 MCG (1000 UNIT) tablet Take 1,000 Units by mouth daily.    [provider]  diphenhydramine-acetaminophen (TYLENOL PM) 25-500 MG TABS tablet Take 1 tablet by mouth at bedtime as needed.    [provider]  FARXIGA 10 MG TABS tablet TAKE 1 TABLET DAILY BEFORE BREAKFAST 03/29/22   Colon Branch, MD  furosemide (LASIX) 40 MG tablet TAKE 2 TABLETS EVERY MORNING AND 1 TABLET LATE IN THE AFTERNOON 02/26/22   Skeet Latch, MD  ipratropium-albuterol (DUONEB) 0.5-2.5 (3) MG/3ML SOLN Take 3 mLs by nebulization 2 (two) times daily as needed. 11/26/19   Colon Branch, MD  Lancets [facility] ULTRASOFT) lancets Check blood sugars no more than twice daily 03/08/17   Colon Branch, MD  loratadine (CLARITIN) 10 MG tablet Take 1 tablet (10 mg total) by mouth daily. 08/04/21   Geradine Girt, DO  Multiple Vitamins-Minerals (MULTIVITAMINS THER. W/MINERALS) TABS tablet Take 1 tablet by mouth daily. 09/14/13   Colon Branch, MD  nitroGLYCERIN (NITROSTAT) 0.4 MG SL tablet Place 1 tablet (0.4 mg total) under the tongue every 5 (five) minutes x 3 doses as needed for chest pain. 09/07/21   Colon Branch, MD  [facility] VERIO test strip USE TO CHECK BLOOD SUGAR NO MORE THAN TWICE A DAY 02/09/21   Colon Branch, MD  pantoprazole (PROTONIX) 40 MG tablet Take 1 tablet (40 mg total) by mouth daily before breakfast. 03/05/22   Colon Branch, MD  tamsulosin (FLOMAX) 0.4 MG CAPS capsule Take 1 capsule (0.4 mg total) by mouth daily after  supper. 05/27/22   Colon Branch, MD  zolpidem (AMBIEN) 10 MG tablet Take 10 mg by mouth at bedtime.    [provider]    Physical Exam: Physical Exam Constitutional:      Appearance: He is obese.     Comments: On bipap  Able to speak relatively in full sentences    HENT:     Head: Normocephalic and atraumatic.  Cardiovascular:     Rate and Rhythm: Normal rate and regular rhythm.  Pulmonary:     Effort: Pulmonary effort is normal.     Comments: + trace wheezes  Chest:     Chest wall: No mass.  Abdominal:     Palpations: Abdomen is soft.     Comments: + mild abd distension    Musculoskeletal:        General: Normal range of motion.     Cervical back: Normal range of motion.     Right lower leg: No edema.  Skin:    General: Skin is warm.  Neurological:     General: No focal deficit present.  Psychiatric:        Mood and Affect: Mood normal.     Vitals:   07/18/22 2130 07/18/22 2200 07/18/22 2215 07/18/22 2317  BP:  127/78  125/68  Pulse: 71 69 71 76  Resp: (!) 29 14 20  (!) 22  Temp:   (!) 96.6 F (35.9 C) 98.3 F (36.8 C)  TempSrc:   Axillary Oral  SpO2: 95% 98% 98% 98%  Weight:      Height:        Data Reviewed:There are no new results to review at this time.  Assessment and Plan: * Acute on chronic diastolic heart failure (HCC) Acute decompensated diastolic CHF  CXR w/ cardiomegaly and CHF pattern  BNP in 270s Fluid retention appears to be in  intra-abdominal space  S/p 60mg  IV lasix  Will give dose of po torsemide x 1 in am  2D ECHO 10/22 w/ EF 55-60%  CAD (coronary artery disease) No active CP  Trop neg x2  Follow    Acute on chronic respiratory failure with hypoxia (HCC) Worsening hypoxia in setting of baseline chronic resp failure on 4L  Noted new onset wheezing and cough w/ rhinorrhea x 4-5 days  CXR w/ cardiomegaly and CHF pattern  BNP 270  Currently now on BIPAP  S/p 60mg  IV lasix in ER  Monitor UOP  Titrate home lasix regimen   IV solumedrol  Doxy Prn xopenex in setting of tachycardia  Follow closely   COPD GOLD 0 spirometry/ AB  + COPD Exacerbation w/ overlapping CHF flare  Suspect viral etiology given symptoms  Covid and flu negative  Will check resp panel  IV solumedrol  Doxy  Prn xopenex    Sinoatrial node dysfunction (HCC) S/p PPM  V paced rhythm in 70s on EKG  Follow    Atrial fibrillation (HCC) Rate controlled  S/p AV node ablation  Follow    Essential hypertension BP stable  Titrate home regimen w/ diuresis  Type 2 diabetes mellitus without complication, without long-term current use of insulin (HCC) SSI  A1C      Advance Care Planning:   Code Status: Full Code   Consults: None  Family Communication: No family at the bedside   Severity of Illness: The appropriate patient status for this patient is INPATIENT. Inpatient status is judged to be reasonable and necessary in order to provide the required intensity of service to ensure the patient's safety. The patient's presenting symptoms, physical exam findings, and initial radiographic and laboratory data in the context of their chronic comorbidities is felt to place them at high risk for further clinical deterioration. Furthermore, it is not anticipated that the patient will be medically stable for discharge from the hospital within 2 midnights of admission.   * I certify that at the point of admission it is my clinical judgment that the patient will require inpatient hospital care spanning beyond 2 midnights from the point of admission due to high intensity of service, high risk for further deterioration and high frequency of surveillance required.*  Author: 11/22, MD 07/19/2022 12:13 AM  For on call review [email or web address].

## 2022-07-19 NOTE — Assessment & Plan Note (Signed)
+   COPD Exacerbation w/ overlapping CHF flare  Suspect viral etiology given symptoms  Covid and flu negative  Will check resp panel  IV solumedrol  Doxy  Prn xopenex

## 2022-07-19 NOTE — Progress Notes (Signed)
RT asked to come back to room to assess this patient again. Patient was having a tight chest feeling and needed a breathing treatment. Again the RN had to increase the patient oxygen to 15 L [place] to maintain a saturation 94%. Neb treatment given. Rt will get scheduled breathing treatments and continue to monitor.

## 2022-07-19 NOTE — Care Plan (Signed)
This 81 years old male with PMH significant for chronic respiratory failure on 4 L of supplemental oxygen at baseline, diastolic CHF, COPD, atrial fibrillation is s/p AV nodal ablation, prior history of intracranial bleed not on anticoagulation, pulmonary hypertension, coronary artery disease, hypertension, hyperlipidemia, type 2 diabetes presented in the ED with worsening shortness of breath.  Patient has developed runny nose, cough, nasal congestion as well as worsening shortness of breath for last 4 to 5 days.  Patient does report increased salt intake.  He was severely hypoxic with SPO2 of 81% on room air, requiring BiPAP on arrival.  Chest x-ray shows cardiomegaly and increased diffuse interstitial pattern concerning for CHF.  Patient was admitted for acute on chronic hypoxic and hypercapnic respiratory failure secondary to CHF and COPD exacerbation.  Assessment/plan: Acute on chronic hypoxic and hypercapnic respiratory failure: Multifactorial Acute decompensated diastolic CHF / Acute COPD exacerbation Patient presented with worsening shortness of breath,  cough and chest congestion. Patient uses 4 L of supplemental oxygen at baseline. Chest x-ray shows cardiomegaly and CHF pattern. BNP 270 / Fluid retention appears to be intra-abdominal space. Continue IV Lasix 40 mg daily. Continue supplemental oxygen and wean as tolerated. Initiated IV Solu-Medrol, transition to prednisone tomorrow. Monitor daily weight, intake output charting. Influenza negative, COVID-negative 2D Echo 10/22 shows LVEF 55 to 60%. Continue Xopenex in the setting of tachycardia Continue doxycycline for COPD exacerbation  CAD: Denies any chest pain.  Troponin negative.  Continue home medications.  SA node dysfunction: S/p PPM V paced rhythm in 70s on EKG Continue to follow  Atrial fibrillation: Heart rate is well controlled.  S/p AV nodal ablation Not on anticoagulation due to the history of bleed   Essential  hypertension Blood pressure stable.  Type 2 diabetes without complication: Carb modified diet, obtain hemoglobin A1c.   Regular insulin sliding scale

## 2022-07-20 ENCOUNTER — Telehealth: Payer: BLUE CROSS/BLUE SHIELD

## 2022-07-20 ENCOUNTER — Inpatient Hospital Stay (HOSPITAL_COMMUNITY): Payer: Medicare Other

## 2022-07-20 ENCOUNTER — Encounter: Payer: Self-pay | Admitting: Pharmacist

## 2022-07-20 DIAGNOSIS — J9621 Acute and chronic respiratory failure with hypoxia: Secondary | ICD-10-CM | POA: Diagnosis not present

## 2022-07-20 DIAGNOSIS — J441 Chronic obstructive pulmonary disease with (acute) exacerbation: Secondary | ICD-10-CM | POA: Diagnosis not present

## 2022-07-20 DIAGNOSIS — I5033 Acute on chronic diastolic (congestive) heart failure: Secondary | ICD-10-CM | POA: Diagnosis not present

## 2022-07-20 DIAGNOSIS — B348 Other viral infections of unspecified site: Secondary | ICD-10-CM

## 2022-07-20 DIAGNOSIS — M7989 Other specified soft tissue disorders: Secondary | ICD-10-CM

## 2022-07-20 LAB — GLUCOSE, CAPILLARY
Glucose-Capillary: 135 mg/dL — ABNORMAL HIGH (ref 70–99)
Glucose-Capillary: 133 mg/dL — ABNORMAL HIGH (ref 70–99)
Glucose-Capillary: 176 mg/dL — ABNORMAL HIGH (ref 70–99)
Glucose-Capillary: 210 mg/dL — ABNORMAL HIGH (ref 70–99)

## 2022-07-20 MED ORDER — METHYLPREDNISOLONE SODIUM SUCC 125 MG IJ SOLR
94.0000 mg | Freq: Two times a day (BID) | INTRAMUSCULAR | Status: DC
  Administered 2022-07-20 – 2022-07-23 (×6): 94 mg via INTRAVENOUS
  Filled 2022-07-20 (×6): qty 2

## 2022-07-20 MED ORDER — GUAIFENESIN ER 600 MG PO TB12
1200.0000 mg | ORAL_TABLET | Freq: Two times a day (BID) | ORAL | Status: DC
  Administered 2022-07-20 – 2022-07-23 (×6): 1200 mg via ORAL
  Filled 2022-07-20 (×6): qty 2

## 2022-07-20 MED ORDER — LEVALBUTEROL HCL 0.63 MG/3ML IN NEBU: 0.6300 mg | INHALATION_SOLUTION | Freq: Two times a day (BID) | RESPIRATORY_TRACT | Status: DC

## 2022-07-20 MED ORDER — LEVALBUTEROL HCL 0.63 MG/3ML IN NEBU
0.6300 mg | INHALATION_SOLUTION | RESPIRATORY_TRACT | Status: DC
  Administered 2022-07-20 (×3): 0.63 mg via RESPIRATORY_TRACT
  Filled 2022-07-20 (×3): qty 3

## 2022-07-20 MED ORDER — METHYLPREDNISOLONE SODIUM SUCC 40 MG IJ SOLR
40.0000 mg | Freq: Two times a day (BID) | INTRAMUSCULAR | Status: DC
  Administered 2022-07-20: 40 mg via INTRAVENOUS
  Filled 2022-07-20: qty 1

## 2022-07-20 MED ORDER — IPRATROPIUM BROMIDE 0.02 % IN SOLN
0.5000 mg | RESPIRATORY_TRACT | Status: DC
  Administered 2022-07-20 (×3): 0.5 mg via RESPIRATORY_TRACT
  Filled 2022-07-20 (×3): qty 2.5

## 2022-07-20 NOTE — Consult Note (Addendum)
NAME:  Bradley [name]., MRN:  [id number], DOB:  20-Sep-1941, LOS: 2 ADMISSION DATE:  07/18/2022, CONSULTATION DATE:  07/20/22 REFERRING MD:  Dr. Dwyane Dee, CHIEF COMPLAINT:  Hypoxia   History of Present Illness:  81 year old M who presented as a transfer from [facility] with complaints of dyspnea, congestion, and cough that has worsened over the past week.  Initially, he was 81% on room air, however he was not wearing his home oxygen.  He wears 4-5L O2 baseline for his COPD.  He received 60 mg IV lasix, Solu-medrol, and DuoNebs in the ED. He has been afebrile.  His initial labs revealed WBC 9.7, Hgb 15, Creatinine 0.7.  Troponin negative x2.  BNP 270s.  Chest x-ray showed cardiomegaly and increased diffuse interstitial pattern concerning for CHF.  EKG with paced rhythm with rate in the 70s. Rhinovirus positive.  Patient states at home he is able to walk the dog, get the mail, go grocery shopping etc.  Prior to admission he would have to catch his breath after 20-30 feet.  He has a productive cough, sometimes produces green-tinged sputum. He watches his salt intake, only uses dash seasoning, no table salt.  Weighs himself occasionally but has not noted any excessive weight loss/gain. Compliant with medication adherence. He denies any sick contacts.   Since admission he has been trialed on BiPAP but not tolerating due to "suffocation". He has been maintained on 10L HFNC within inability to wean. 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   Pertinent  Medical History  COPD on 4L baseline Chronic respiratory failure HFrEF Atrial Fibrillation CAD HLD HTN Intracranial bleed Pacemaker PVD DM2 Pulmonary HTN  Significant Hospital Events: Including procedures, antibiotic start and stop dates in addition to other pertinent events   10/9 Admitted to [facility] 10/10 PCCM consulted for hypoxia requiring 10L HFNC  Interim History / Subjective:   Afebrile UOP 1.7 L  Objective   Blood pressure (!) 106/56, pulse 72, temperature 97.6 F (36.4 C), temperature source Oral, resp. rate (!) 22, height 5\' 8"  (1.727 m), weight 83.9 kg, SpO2 98 %.    FiO2 (%):  [36 %] 36 %   Intake/Output Summary (Last 24 hours) at 07/20/2022 1311 Last data filed at 07/20/2022 1236 Gross per 24 hour  Intake 360 ml  Output 2050 ml  Net -1690 ml   Filed Weights   07/18/22 1714 07/19/22 0500  Weight: 83.9 kg 83.9 kg    Examination: General: chronically ill-appearing adult male, sitting in bed in NAD HENT: MM pink/moist, arcus senilis, pupils =/reactive, on salter O2 Lungs: pursed lip breathing, coarse breath with wheezing bilaterally Cardiovascular: S1S2 RRR, no MRG Abdomen: Soft, non-tender, non-distended abdomen, tolerating PO's Extremities: trace to 1+ pre-tibial pitting edema, moves all extremities, chronic discoloration of LE's consistent with chronic venous stasis  Neuro: Alert and oriented x4, non focal/MAE  Resolved Hospital Problem list     Assessment & Plan:   Acute on Chronic Hypoxic and Hypercapnic Respiratory Failure secondary to Rhinovirus Acute COPD Exacerbation  Acute Decompensated CHF Rhinovirus Admitted with acute worsening of SOB, suspect rhinovirus in setting of underlying obstructive lung disease and CHF. Less likely PE given negative troponin.  -pulmonary hygiene - IS, mobilize  -BiPAP PRN if patient will tolerate / allow  -wean O2 for saturation 88-90% -increase xopenex, add atrovent > Q4 -Continue doxycyline -add guaifenesin for secretion clearance  -flutter valve -increase solumedrol  to 60 mg Q8  -continue lasix PO -assess LE venous duplex, ECHO.  Troponin negative.  -Strict I&Os -Daily weights  Best Practice (right click and "Reselect all SmartList Selections" daily)  Per TRH   Labs   CBC: Recent Labs  Lab 07/18/22 1721 07/19/22 0446  WBC 9.7 9.9  NEUTROABS 6.6  --   HGB 15.1 15.1  HCT 45.7 45.5   MCV 92.0 92.9  PLT 190 187   Basic Metabolic Panel: Recent Labs  Lab 07/18/22 1721 07/19/22 0446  NA 140 141  K 4.2 4.2  CL 103 100  CO2 31 32  GLUCOSE 149* 173*  BUN 13 16  CREATININE 0.73 0.78  CALCIUM 8.5* 8.7*   GFR: Estimated Creatinine Clearance: 76.4 mL/min (by C-G formula based on SCr of 0.78 mg/dL). Recent Labs  Lab 07/18/22 1721 07/19/22 0446  WBC 9.7 9.9   Liver Function Tests: Recent Labs  Lab 07/19/22 0446  AST 19  ALT 14  ALKPHOS 68  BILITOT 0.9  PROT 7.3  ALBUMIN 3.4*   No results for input(s): "LIPASE", "AMYLASE" in the last 168 hours. No results for input(s): "AMMONIA" in the last 168 hours.  ABG    Component Value Date/Time   PHART 7.42 07/19/2022 0020   PCO2ART 52 (H) 07/19/2022 0020   PO2ART 89 07/19/2022 0020   HCO3 33.7 (H) 07/19/2022 0020   TCO2 33 (H) 07/29/2021 2148   ACIDBASEDEF 1.0 12/18/2015 2138   O2SAT 97.4 07/19/2022 0020     Coagulation Profile: No results for input(s): "INR", "PROTIME" in the last 168 hours.  Cardiac Enzymes: No results for input(s): "CKTOTAL", "CKMB", "CKMBINDEX", "TROPONINI" in the last 168 hours.  HbA1C: Hgb A1c MFr Bld  Date/Time Value Ref Range Status  07/19/2022 04:46 AM 6.2 (H) 4.8 - 5.6 % Final    Comment:    (NOTE) Pre diabetes:          5.7%-6.4%  Diabetes:              >6.4%  Glycemic control for   <7.0% adults with diabetes   07/13/2022 11:22 AM 6.5 4.6 - 6.5 % Final    Comment:    Glycemic Control Guidelines for People with Diabetes:Non Diabetic:  <6%Goal of Therapy: <7%Additional Action Suggested:  >8%     CBG: Recent Labs  Lab 07/19/22 1145 07/19/22 1704 07/19/22 2131 07/20/22 0754 07/20/22 1232  GLUCAP 133* 258* 161* 135* 133*    Review of Systems: Positives in [place]  Gen: Denies fever, chills, weight change, fatigue, night sweats HEENT: Denies blurred vision, double vision, hearing loss, tinnitus, sinus congestion, rhinorrhea, sore throat, neck stiffness,  dysphagia PULM: Denies shortness of breath, cough, sputum production, hemoptysis, wheezing CV: Denies chest pain, edema, orthopnea, paroxysmal nocturnal dyspnea, palpitations GI: Denies abdominal pain, nausea, vomiting, diarrhea, hematochezia, melena, constipation, change in bowel habits GU: Denies dysuria, hematuria, polyuria, oliguria, urethral discharge Endocrine: Denies hot or cold intolerance, polyuria, polyphagia or appetite change Derm: Denies rash, dry skin, scaling or peeling skin change Heme: Denies easy bruising, bleeding, bleeding gums Neuro: Denies headache, numbness, weakness, slurred speech, loss of memory or consciousness  Past Medical History:  He,  has a past medical history of Abnormal CT scan, chest (2012), Anemia, Arthritis, Atrial fibrillation (HCC), BPH (benign prostatic hyperplasia), Bronchitis (08/26/2017), CAD (coronary artery disease), CHF (congestive heart failure) (HCC), Chronic bronchitis (HCC), Chronic diastolic heart failure (HCC), Chronic respiratory failure with hypoxia and hypercapnia (HCC) (09/14/2010), Heart murmur, HLD (hyperlipidemia), HTN (hypertension), ICB (intracranial  bleed) (HCC) (06/2012), Insomnia, Migraines, On home oxygen therapy, Pacemaker, Peripheral vascular disease (HCC), Pleural effusion (2008), Pneumonia (08/02/2011), Pulmonary HTN (HCC), and Type II diabetes mellitus (HCC) (1999).   Surgical History:   Past Surgical History:  Procedure Laterality Date   BI-VENTRICULAR PACEMAKER INSERTION Left 09/08/2011   Procedure: BI-VENTRICULAR PACEMAKER INSERTION (CRT-P);  Surgeon: Marinus Maw, MD;  Location: [facility] CATH LAB;  Service: Cardiovascular;  Laterality: Left;   CARDIAC CATHETERIZATION     "couple times; never had balloon or stent" (09/19/2018)   CATARACT EXTRACTION W/ INTRAOCULAR LENS  IMPLANT, BILATERAL Bilateral 08/2018   COLONOSCOPY  03/10/11   normal   INSERT / REPLACE / REMOVE PACEMAKER  09/08/11   pacemaker placement   LUNG  DECORTICATION     PLEURAL SCARIFICATION     pneumothorax with fibrothorax  ~ 2010   TONSILLECTOMY     "as a kid"      Social History:   reports that he quit smoking about 41 years ago. His smoking use included cigarettes and cigars. He has a 62.50 pack-year smoking history. He has never used smokeless tobacco. He reports that he does not currently use alcohol. He reports that he does not use drugs.   Family History:  His family history includes Breast cancer in his maternal aunt; Coronary artery disease in his father; Diabetes in his father; Drug abuse in his son; Polycythemia in his mother. There is no history of Prostate cancer or Colon cancer.   Allergies Allergies  Allergen Reactions   Hydrocodone Other (See Comments)    "given to him in the hospital; went thru withdrawals once home; dr said not to take it again" (09/27/2012) dizziness   Tramadol Other (See Comments)    "given to him in the hospital; went thru withdrawals once home; dr said not to take it again" (09/27/2012) dizziness   Tadalafil Other (See Comments)    headache, backache, dizziness   Avelox [Moxifloxacin Hydrochloride] Itching and Other (See Comments)    Headache, dizziness     Home Medications  Prior to Admission medications   Medication Sig Start Date End Date Taking? Authorizing Provider  acetaminophen (TYLENOL) 500 MG tablet Take 1,000 mg by mouth as needed for headache or moderate pain.   Yes [provider]  albuterol (VENTOLIN HFA) 108 (90 Base) MCG/ACT inhaler Inhale 2 puffs into the lungs every 6 (six) hours as needed for wheezing or shortness of breath. Patient taking differently: Inhale 1 puff into the lungs as needed for wheezing or shortness of breath. 03/11/21  Yes Ghimire, Werner Lean, MD  amLODipine (NORVASC) 10 MG tablet Take 1 tablet (10 mg total) by mouth daily. 06/07/22  Yes [name], MD  aspirin 81 MG tablet Take 81 mg by mouth every morning.    Yes [provider]   atorvastatin (LIPITOR) 20 MG tablet Take 1 tablet (20 mg total) by mouth at bedtime. 06/07/22  Yes Paz, Elita Quick E, MD  budesonide (PULMICORT) 0.5 MG/2ML nebulizer solution Take 2 mLs (0.5 mg total) by nebulization 2 (two) times daily. 11/26/19  Yes Paz, Nolon Rod, MD  calcium-vitamin D (OSCAL WITH D) 500-200 MG-UNIT tablet Take 1 tablet by mouth every morning.   Yes [provider]  carvedilol (COREG) 25 MG tablet TAKE 1 TABLET TWICE A DAY WITH MEALS Patient taking differently: Take 25 mg by mouth 2 (two) times daily with a meal. 05/17/22  Yes Paz, Nolon Rod, MD  cholecalciferol (VITAMIN D3) 25 MCG (1000 UNIT) tablet  Take 1,000 Units by mouth at bedtime.   Yes [provider]  diphenhydramine-acetaminophen (TYLENOL PM) 25-500 MG TABS tablet Take 1 tablet by mouth at bedtime.   Yes [provider]  FARXIGA 10 MG TABS tablet TAKE 1 TABLET DAILY BEFORE BREAKFAST Patient taking differently: Take 10 mg by mouth daily. 03/29/22  Yes Paz, Nolon Rod, MD  furosemide (LASIX) 40 MG tablet TAKE 2 TABLETS EVERY MORNING AND 1 TABLET LATE IN THE AFTERNOON Patient taking differently: Take 40 mg by mouth daily. 02/26/22  Yes [name], MD  ipratropium-albuterol (DUONEB) 0.5-2.5 (3) MG/3ML SOLN Take 3 mLs by nebulization 2 (two) times daily as needed. Patient taking differently: Take 3 mLs by nebulization in the morning and at bedtime. 11/26/19  Yes Paz, Nolon Rod, MD  loratadine (CLARITIN) 10 MG tablet Take 1 tablet (10 mg total) by mouth daily. 08/04/21  Yes Joseph Art, DO  Multiple Vitamins-Minerals (MULTIVITAMINS THER. W/MINERALS) TABS tablet Take 1 tablet by mouth daily. 09/14/13  Yes Paz, Nolon Rod, MD  nitroGLYCERIN (NITROSTAT) 0.4 MG SL tablet Place 1 tablet (0.4 mg total) under the tongue every 5 (five) minutes x 3 doses as needed for chest pain. 09/07/21  Yes [name], MD  pantoprazole (PROTONIX) 40 MG tablet Take 1 tablet (40 mg total) by mouth daily before breakfast. 03/05/22  Yes [name], MD  tamsulosin (FLOMAX) 0.4 MG CAPS capsule Take 1 capsule (0.4 mg total) by mouth daily after supper. 05/27/22  Yes Paz, Nolon Rod, MD  zolpidem (AMBIEN) 10 MG tablet Take 10 mg by mouth at bedtime.   Yes [provider]  Lancets Letta Pate ULTRASOFT) lancets Check blood sugars no more than twice daily 03/08/17   [name], MD  [facility] VERIO test strip USE TO CHECK BLOOD SUGAR NO MORE THAN TWICE A DAY 02/09/21   [name], MD     Critical care time: n/a    Canary Brim, MSN, APRN, NP-C, AGACNP-BC [facility] 07/20/2022, 3:16 PM   Please see [email or web address] for pager details.   From 7A-7P if no response, please call [phone] After hours, please call ELink [phone]

## 2022-07-20 NOTE — Progress Notes (Signed)
Pt stated having hard time breathing with the SVN. Discontinued treatment and returned to [place].

## 2022-07-20 NOTE — Progress Notes (Signed)
Bilateral lower extremity venous duplex has been completed. Preliminary results can be found in CV Proc through chart review.  Results were given to Noe Gens NP.  07/20/22 3:43 PM Bradley [name] RVT

## 2022-07-20 NOTE — TOC Initial Note (Signed)
Transition of Care (TOC) - Initial/Assessment Note    Patient Details  Name: Bradley SHUTES Sr. MRN: [id number] Date of Birth: 12-22-1940  Transition of [facility]) CM/SW Contact:    Lanier Clam, RN Phone Number: 07/20/2022, 12:24 PM  Clinical Narrative: Has home 02 w/[employer]-will monitor for d/c plans.                  Expected Discharge Plan: Home/Self Care Barriers to Discharge: Continued Medical Work up   Patient Goals and CMS Choice Patient states their goals for this hospitalization and ongoing recovery are::  (Home) CMS Medicare.gov Compare Post Acute Care list provided to:: Patient Represenative (must comment) Choice offered to / list presented to : Spouse  Expected Discharge Plan and Services Expected Discharge Plan: Home/Self Care   Discharge Planning Services: CM Consult   Living arrangements for the past 2 months: Single Family Home                                      Prior Living Arrangements/Services Living arrangements for the past 2 months: Single Family Home Lives with:: Spouse Patient language and need for interpreter reviewed:: Yes Do you feel safe going back to the place where you live?: Yes      Need for Family Participation in Patient Care: Yes (Comment) Care giver support system in place?: Yes (comment)   Criminal Activity/Legal Involvement Pertinent to Current Situation/Hospitalization: No - Comment as needed  Activities of Daily Living Home Assistive Devices/Equipment: Eyeglasses, CBG Meter ADL Screening (condition at time of admission) Patient's cognitive ability adequate to safely complete daily activities?: Yes Is the patient deaf or have difficulty hearing?: No Does the patient have difficulty seeing, even when wearing glasses/contacts?: No Does the patient have difficulty concentrating, remembering, or making decisions?: No Patient able to express need for assistance with ADLs?: Yes Does the patient have difficulty dressing  or bathing?: No Independently performs ADLs?: Yes (appropriate for developmental age) Does the patient have difficulty walking or climbing stairs?: No Weakness of Legs: None Weakness of Arms/Hands: None  Permission Sought/Granted Permission sought to share information with : Case Manager Permission granted to share information with : Yes, Verbal Permission Granted  Share Information with NAME:  (Case manager)           Emotional Assessment Appearance:: Appears stated age   Affect (typically observed): Accepting Orientation: : Oriented to Self, Oriented to Place, Oriented to  Time, Oriented to Situation Alcohol / Substance Use: Not Applicable Psych Involvement: No (comment)  Admission diagnosis:  Acute on chronic diastolic heart failure (HCC) [I50.33] COPD exacerbation (HCC) [J44.1] Acute on chronic respiratory failure with hypoxia (HCC) [J96.21] Patient Active Problem List   Diagnosis Date Noted   CAD (coronary artery disease) 07/19/2022   Acute on chronic diastolic heart failure (HCC) 07/18/2022   Acute on chronic respiratory failure with hypoxia (HCC) 07/18/2022   GERD without esophagitis 07/30/2021   Cor pulmonale (chronic) (HCC) 08/05/2020   COPD exacerbation (HCC) 12/22/2018   COPD GOLD 0 spirometry/ AB  12/22/2018   PCP NOTES >>> 07/15/2015   Annual physical exam 05/08/2012   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   Sinoatrial node dysfunction (HCC)    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   Chronic diastolic heart failure (HCC)    PAD (peripheral artery disease) (HCC) 10/22/2010   DERMATITIS, STASIS 09/14/2010   Chronic respiratory failure with hypoxia and hypercapnia (HCC) 09/14/2010  Chronic pulmonary hypertension secondary to elevated L H pressures  04/15/2009   ERECTILE DYSFUNCTION 01/06/2009   Mixed diabetic hyperlipidemia associated with type 2 diabetes mellitus ([place]) 12/13/2007   Essential hypertension 12/13/2007   Anxiety--insomnia 08/14/2007   Type 2  diabetes mellitus without complication, without long-term current use of insulin ([place]) 01/02/2007   Atrial fibrillation ([place]) 01/02/2007   Benign enlargement of prostate 01/02/2007   PCP:  Colon Branch, MD Pharmacy:   [address], [place] [address] Phone: [phone] Fax: [id number] - [place], [place] - [address] AT [place] [address] [place] [address] Phone: [phone] Fax: [phone]     Social Determinants of Health (SDOH) Interventions    Readmission Risk Interventions    09/02/2021   10:31 AM 08/03/2021    9:10 AM  Readmission Risk Prevention Plan  Transportation Screening Complete Complete  PCP or Specialist Appt within 5-7 Days  Complete  PCP or Specialist Appt within 3-5 Days Complete   Home Care Screening  Complete  Medication Review (RN CM)  Complete  HRI or Home Care Consult Complete   Social Work Consult for [place] Planning/Counseling Complete   Palliative Care Screening Not Applicable   Medication Review Press photographer) Complete

## 2022-07-20 NOTE — Progress Notes (Signed)
PROGRESS NOTE    Bradley PRUETT Sr.  RSW:[id number] DOB: 02-06-1941 DOA: 07/18/2022  PCP: [name], MD   Brief Narrative:  This 81 years old male with PMH significant for chronic respiratory failure on 4 L of supplemental oxygen at baseline, diastolic CHF, COPD, atrial fibrillation s/p AV nodal ablation, prior history of intracranial bleed not on anticoagulation, pulmonary hypertension, coronary artery disease, hypertension, hyperlipidemia, type 2 diabetes presented in the ED with worsening shortness of breath.  Patient has developed runny nose, cough, nasal congestion as well as worsening shortness of breath for last 4 to 5 days.  Patient does report increased salt intake.  He was severely hypoxic with SPO2 of 81% on room air, requiring BiPAP on arrival.  Chest x-ray shows cardiomegaly and increased diffuse interstitial pattern concerning for CHF.  Patient was admitted for acute on chronic hypoxic and hypercapnic respiratory failure secondary to CHF and COPD exacerbation.  Assessment & Plan:   Principal Problem:   Acute on chronic diastolic heart failure (HCC) Active Problems:   Type 2 diabetes mellitus without complication, without long-term current use of insulin (HCC)   Essential hypertension   Atrial fibrillation (HCC)   Sinoatrial node dysfunction (HCC)   COPD GOLD 0 spirometry/ AB    Acute on chronic respiratory failure with hypoxia (HCC)   CAD (coronary artery disease)   Acute on chronic hypoxic and hypercapnic respiratory failure: Multifactorial Acute decompensated diastolic CHF / Acute COPD exacerbation: Rhinovirus+ Patient presented with worsening shortness of breath,  cough and chest congestion. Patient uses 4 L of supplemental oxygen at baseline. He was severely hypoxic with SPO2 of 81% on room air requiring BiPAP on arrival. Chest x-ray shows cardiomegaly and CHF pattern. BNP 270 / Fluid retention appears to be intra-abdominal space, there is no pedal edema. Continue IV  Lasix 40 mg daily. Continue supplemental oxygen and wean as tolerated, now requiring HFNC @10  L Continue Solu-Medrol 40 mg q12 hr, transition to prednisone tomorrow. Monitor daily weight, intake output charting. Influenza negative, COVID-negative, Rhinovirus + 2D Echo 10/22 shows LVEF 55 to 60%. No RWMA Continue Xopenex in the setting of tachycardia. Continue doxycycline for COPD exacerbation. Patient continued to remain hypoxic requiring high flow nasal cannula. Pulmonology is consulted.  CAD: Denies any chest pain.  Troponin negative.   Continue home medications(Coreg, Lipitor)   SA node dysfunction: S/p PPM V paced rhythm in 70s on EKG Continue to follow   Atrial fibrillation: Heart rate is well controlled.  S/p AV nodal ablation Not on anticoagulation due to the history of bleed   Essential hypertension Blood pressure stable.   Type 2 diabetes without complication: Carb modified diet, obtain hemoglobin A1c.   Regular insulin sliding scale   DVT prophylaxis:  Code Status: Full code Family Communication: No family at bedside Disposition Plan:  Status is: Inpatient Remains inpatient appropriate because: Admitted for acute on chronic hypoxic and hypercapnic respiratory failure secondary to COPD and CHF exacerbation.  Patient is not medically clear requiring IV steroids and IV diuresis.   Consultants:  Pulmonology  Procedures: BiPAP Antimicrobials: Doxycycline  Subjective: Patient was seen and examined at bedside.  Overnight events noted. Patient continued to remain on high flow nasal cannula 10 L, reports feeling slightly better. He denies any chest pain, palpitation or dizziness.  Objective: Vitals:   07/20/22 0143 07/20/22 0150 07/20/22 0532 07/20/22 0837  BP:   126/75   Pulse:  70 75   Resp:  (!) 25 20   Temp:  98.2 F (36.8 C)   TempSrc:      SpO2: 92% 92% 96% 95%  Weight:      Height:        Intake/Output Summary (Last 24 hours) at 07/20/2022  1215 Last data filed at 07/20/2022 0023 Gross per 24 hour  Intake 120 ml  Output 1250 ml  Net -1130 ml   Filed Weights   07/18/22 1714 07/19/22 0500  Weight: 83.9 kg 83.9 kg    Examination:  General exam: Appears comfortable, not in any acute distress. Respiratory system: CTA bilaterally, no wheezing, no crackles, normal respiratory effort, RR 15 Cardiovascular system: S1 & S2 heard, irregular rhythm, no murmur. Gastrointestinal system: Abdomen is soft, distended, non tender, BS+ Central nervous system: Alert and oriented X 3. No focal neurological deficits. Extremities: No edema, no cyanosis, no clubbing. Skin: No rashes, lesions or ulcers Psychiatry: Judgement and insight appear normal. Mood & affect appropriate.     Data Reviewed: I have personally reviewed following labs and imaging studies  CBC: Recent Labs  Lab 07/18/22 1721 07/19/22 0446  WBC 9.7 9.9  NEUTROABS 6.6  --   HGB 15.1 15.1  HCT 45.7 45.5  MCV 92.0 92.9  PLT 190 187   Basic Metabolic Panel: Recent Labs  Lab 07/18/22 1721 07/19/22 0446  NA 140 141  K 4.2 4.2  CL 103 100  CO2 31 32  GLUCOSE 149* 173*  BUN 13 16  CREATININE 0.73 0.78  CALCIUM 8.5* 8.7*   GFR: Estimated Creatinine Clearance: 76.4 mL/min (by C-G formula based on SCr of 0.78 mg/dL). Liver Function Tests: Recent Labs  Lab 07/19/22 0446  AST 19  ALT 14  ALKPHOS 68  BILITOT 0.9  PROT 7.3  ALBUMIN 3.4*   No results for input(s): "LIPASE", "AMYLASE" in the last 168 hours. No results for input(s): "AMMONIA" in the last 168 hours. Coagulation Profile: No results for input(s): "INR", "PROTIME" in the last 168 hours. Cardiac Enzymes: No results for input(s): "CKTOTAL", "CKMB", "CKMBINDEX", "TROPONINI" in the last 168 hours. BNP (last 3 results) No results for input(s): "PROBNP" in the last 8760 hours. HbA1C: Recent Labs    07/19/22 0446  HGBA1C 6.2*   CBG: Recent Labs  Lab 07/19/22 0746 07/19/22 1145  07/19/22 1704 07/19/22 2131 07/20/22 0754  GLUCAP 168* 133* 258* 161* 135*   Lipid Profile: No results for input(s): "CHOL", "HDL", "LDLCALC", "TRIG", "CHOLHDL", "LDLDIRECT" in the last 72 hours. Thyroid Function Tests: No results for input(s): "TSH", "T4TOTAL", "FREET4", "T3FREE", "THYROIDAB" in the last 72 hours. Anemia Panel: No results for input(s): "VITAMINB12", "FOLATE", "FERRITIN", "TIBC", "IRON", "RETICCTPCT" in the last 72 hours. Sepsis Labs: No results for input(s): "PROCALCITON", "LATICACIDVEN" in the last 168 hours.  Recent Results (from the past 240 hour(s))  Resp Panel by RT-PCR (Flu A&B, Covid) Anterior Nasal Swab     Status: None   Collection Time: 07/18/22  5:21 PM   Specimen: Anterior Nasal Swab  Result Value Ref Range Status   SARS Coronavirus 2 by RT PCR NEGATIVE NEGATIVE Final    Comment: (NOTE) SARS-CoV-2 target nucleic acids are NOT DETECTED.  The SARS-CoV-2 RNA is generally detectable in upper respiratory specimens during the acute phase of infection. The lowest concentration of SARS-CoV-2 viral copies this assay can detect is 138 copies/mL. A negative result does not preclude SARS-Cov-2 infection and should not be used as the sole basis for treatment or other patient management decisions. A negative result may occur with  improper specimen collection/handling,  submission of specimen other than nasopharyngeal swab, presence of viral mutation(s) within the areas targeted by this assay, and inadequate number of viral copies(<138 copies/mL). A negative result must be combined with clinical observations, patient history, and epidemiological information. The expected result is Negative.  Fact Sheet for Patients:  BloggerCourse.com  Fact Sheet for Healthcare Providers:  SeriousBroker.it  This test is no t yet approved or cleared by the [place] FDA and  has been authorized for detection and/or  diagnosis of SARS-CoV-2 by FDA under an Emergency Use Authorization (EUA). This EUA will remain  in effect (meaning this test can be used) for the duration of the COVID-19 declaration under Section 564(b)(1) of the Act, 21 U.S.C.section 360bbb-3(b)(1), unless the authorization is terminated  or revoked sooner.       Influenza A by PCR NEGATIVE NEGATIVE Final   Influenza B by PCR NEGATIVE NEGATIVE Final    Comment: (NOTE) The Xpert Xpress SARS-CoV-2/FLU/RSV plus assay is intended as an aid in the diagnosis of influenza from Nasopharyngeal swab specimens and should not be used as a sole basis for treatment. Nasal washings and aspirates are unacceptable for Xpert Xpress SARS-CoV-2/FLU/RSV testing.  Fact Sheet for Patients: BloggerCourse.com  Fact Sheet for Healthcare Providers: SeriousBroker.it  This test is not yet approved or cleared by the [place] FDA and has been authorized for detection and/or diagnosis of SARS-CoV-2 by FDA under an Emergency Use Authorization (EUA). This EUA will remain in effect (meaning this test can be used) for the duration of the COVID-19 declaration under Section 564(b)(1) of the Act, 21 U.S.C. section 360bbb-3(b)(1), unless the authorization is terminated or revoked.  Performed at [facility], [address] [place], [place] [address]   Respiratory (~20 pathogens) panel by PCR     Status: Abnormal   Collection Time: 07/19/22  6:21 AM   Specimen: Nasopharyngeal Swab; Respiratory  Result Value Ref Range Status   Adenovirus NOT DETECTED NOT DETECTED Final   Coronavirus 229E NOT DETECTED NOT DETECTED Final    Comment: (NOTE) The Coronavirus on the Respiratory Panel, DOES NOT test for the novel  Coronavirus (2019 nCoV)    Coronavirus HKU1 NOT DETECTED NOT DETECTED Final   Coronavirus NL63 NOT DETECTED NOT DETECTED Final   Coronavirus OC43 NOT DETECTED NOT DETECTED Final    Metapneumovirus NOT DETECTED NOT DETECTED Final   Rhinovirus / Enterovirus DETECTED (A) NOT DETECTED Final   Influenza A NOT DETECTED NOT DETECTED Final   Influenza B NOT DETECTED NOT DETECTED Final   Parainfluenza Virus 1 NOT DETECTED NOT DETECTED Final   Parainfluenza Virus 2 NOT DETECTED NOT DETECTED Final   Parainfluenza Virus 3 NOT DETECTED NOT DETECTED Final   Parainfluenza Virus 4 NOT DETECTED NOT DETECTED Final   Respiratory Syncytial Virus NOT DETECTED NOT DETECTED Final   Bordetella pertussis NOT DETECTED NOT DETECTED Final   Bordetella Parapertussis NOT DETECTED NOT DETECTED Final   Chlamydophila pneumoniae NOT DETECTED NOT DETECTED Final   Mycoplasma pneumoniae NOT DETECTED NOT DETECTED Final    Comment: Performed at [facility], [address] [place], [place] [address]     Radiology Studies: DG CHEST PORT 1 VIEW  Result Date: 07/19/2022 CLINICAL DATA:  Shortness of breath EXAM: PORTABLE CHEST 1 VIEW COMPARISON:  07/18/2022 FINDINGS: Left chest cardiac device with unchanged lead position. Unchanged cardiomegaly. Aortic atherosclerosis. Diffuse interstitial opacities, likely pulmonary edema. No definite pleural. No pneumothorax. No acute osseous abnormality. IMPRESSION: Cardiomegaly and pulmonary edema. Electronically  Signed   By: Merilyn Baba M.D.   On: 07/19/2022 12:48   DG Chest Port 1 View  Result Date: 07/18/2022 CLINICAL DATA:  Cough and shortness of breath. Respiratory distress. EXAM: PORTABLE CHEST 1 VIEW COMPARISON:  Two-view chest x-ray 11/11/2021 FINDINGS: Heart is enlarged. Pacing wires are in place. Atherosclerotic calcifications are present in the aortic arch. Diffuse interstitial pattern is increased, suggesting edema superimposed on chronic interstitial changes. No focal airspace consolidation is present. No definite effusions are present. IMPRESSION: 1. Cardiomegaly and increased diffuse interstitial pattern compatible with congestive heart failure. 2.  Aortic atherosclerosis. Electronically Signed   By: San Morelle M.D.   On: 07/18/2022 17:45    Scheduled Meds:  atorvastatin  20 mg Oral QHS   carvedilol  25 mg Oral BID WC   doxycycline  100 mg Oral Q12H   enoxaparin (LOVENOX) injection  40 mg Subcutaneous Q24H   furosemide  40 mg Oral QAC breakfast   insulin aspart  0-15 Units Subcutaneous TID WC   insulin aspart  0-5 Units Subcutaneous QHS   levalbuterol  0.63 mg Nebulization BID   methylPREDNISolone (SOLU-MEDROL) injection  40 mg Intravenous Q12H   pantoprazole  40 mg Oral QAC breakfast   sodium chloride flush  3 mL Intravenous Q12H   Continuous Infusions:  sodium chloride       LOS: 2 days    Time spent: 50 mins    Surya Schroeter, MD [facility]   If 7PM-7AM, please contact night-coverage

## 2022-07-21 ENCOUNTER — Inpatient Hospital Stay (HOSPITAL_COMMUNITY): Payer: Medicare Other

## 2022-07-21 DIAGNOSIS — I5033 Acute on chronic diastolic (congestive) heart failure: Secondary | ICD-10-CM | POA: Diagnosis not present

## 2022-07-21 DIAGNOSIS — J441 Chronic obstructive pulmonary disease with (acute) exacerbation: Secondary | ICD-10-CM | POA: Diagnosis not present

## 2022-07-21 DIAGNOSIS — R0603 Acute respiratory distress: Secondary | ICD-10-CM

## 2022-07-21 LAB — CBC
HCT: 44.2 % (ref 39.0–52.0)
Hemoglobin: 14.2 g/dL (ref 13.0–17.0)
MCH: 30.3 pg (ref 26.0–34.0)
MCHC: 32.1 g/dL (ref 30.0–36.0)
MCV: 94.2 fL (ref 80.0–100.0)
Platelets: 193 10*3/uL (ref 150–400)
RBC: 4.69 MIL/uL (ref 4.22–5.81)
RDW: 14.1 % (ref 11.5–15.5)
WBC: 6.9 10*3/uL (ref 4.0–10.5)
nRBC: 0 % (ref 0.0–0.2)

## 2022-07-21 LAB — BASIC METABOLIC PANEL
Anion gap: 7 (ref 5–15)
BUN: 30 mg/dL — ABNORMAL HIGH (ref 8–23)
CO2: 33 mmol/L — ABNORMAL HIGH (ref 22–32)
Calcium: 8.9 mg/dL (ref 8.9–10.3)
Chloride: 99 mmol/L (ref 98–111)
Creatinine, Ser: 0.76 mg/dL (ref 0.61–1.24)
GFR, Estimated: >60 mL/min (ref >60)
Glucose, Bld: 165 mg/dL — ABNORMAL HIGH (ref 70–99)
Potassium: 4.4 mmol/L (ref 3.5–5.1)
Sodium: 139 mmol/L (ref 135–145)

## 2022-07-21 LAB — ECHOCARDIOGRAM COMPLETE
AR max vel: 2.08 cm2
AV Area VTI: 1.94 cm2
AV Area mean vel: 1.87 cm2
AV Mean grad: 6 mmHg
AV Peak grad: 11.3 mmHg
Ao pk vel: 1.68 m/s
Area-P 1/2: 4.21 cm2
Calc EF: 57.1 %
Height: 68 in
MV M vel: 2.89 m/s
MV Peak grad: 33.4 mmHg
S' Lateral: 2.4 cm
Single Plane A2C EF: 55.4 %
Single Plane A4C EF: 57.6 %
Weight: 2865.6 oz

## 2022-07-21 LAB — PHOSPHORUS: Phosphorus: 3.8 mg/dL (ref 2.5–4.6)

## 2022-07-21 LAB — GLUCOSE, CAPILLARY
Glucose-Capillary: 198 mg/dL — ABNORMAL HIGH (ref 70–99)
Glucose-Capillary: 190 mg/dL — ABNORMAL HIGH (ref 70–99)
Glucose-Capillary: 120 mg/dL — ABNORMAL HIGH (ref 70–99)
Glucose-Capillary: 233 mg/dL — ABNORMAL HIGH (ref 70–99)

## 2022-07-21 LAB — MAGNESIUM: Magnesium: 2.4 mg/dL (ref 1.7–2.4)

## 2022-07-21 MED ORDER — IPRATROPIUM BROMIDE 0.02 % IN SOLN
0.5000 mg | Freq: Four times a day (QID) | RESPIRATORY_TRACT | Status: DC
  Administered 2022-07-22 – 2022-07-23 (×5): 0.5 mg via RESPIRATORY_TRACT
  Filled 2022-07-21 (×5): qty 2.5

## 2022-07-21 MED ORDER — IPRATROPIUM BROMIDE 0.02 % IN SOLN
0.5000 mg | RESPIRATORY_TRACT | Status: DC
  Administered 2022-07-21 (×4): 0.5 mg via RESPIRATORY_TRACT
  Filled 2022-07-21 (×4): qty 2.5

## 2022-07-21 MED ORDER — LEVALBUTEROL HCL 0.63 MG/3ML IN NEBU
0.6300 mg | INHALATION_SOLUTION | RESPIRATORY_TRACT | Status: DC
  Administered 2022-07-21 (×4): 0.63 mg via RESPIRATORY_TRACT
  Filled 2022-07-21 (×4): qty 3

## 2022-07-21 MED ORDER — LEVALBUTEROL HCL 0.63 MG/3ML IN NEBU
0.6300 mg | INHALATION_SOLUTION | Freq: Four times a day (QID) | RESPIRATORY_TRACT | Status: DC
  Administered 2022-07-22 – 2022-07-23 (×5): 0.63 mg via RESPIRATORY_TRACT
  Filled 2022-07-21 (×5): qty 3

## 2022-07-21 MED ORDER — ORAL CARE MOUTH RINSE: 15.0000 mL | OROMUCOSAL | Status: DC | PRN

## 2022-07-21 NOTE — Progress Notes (Signed)
ATTESTATION & SIGNATURE   STAFF NOTE: I, Dr Lavinia Sharps have personally reviewed patient's available data, including medical history, events of note, physical examination and test results as part of my evaluation. I have discussed with resident/NP and other care providers such as pharmacist, RN and RRT.  In addition,  I personally evaluated patient and elicited key findings of   S: Date of admit 07/18/2022 with LOS 3 for today 07/21/2022 : Bradley Searles Sr. is  better after increased neb/steroid AECOPD rx  O:  Blood pressure 121/68, pulse 80, temperature 97.6 F (36.4 C), temperature source Oral, resp. rate 19, height 5\' 8"  (1.727 m), weight 81.2 kg, SpO2 94 %.   Looks a lot better Thinks he can go home Less but still with purse lip + Wheezing resolved/mild + Axox#   LABS    PULMONARY Recent Labs  Lab 07/19/22 0020  PHART 7.42  PCO2ART 52*  PO2ART 89  HCO3 33.7*  O2SAT 97.4    CBC Recent Labs  Lab 07/18/22 1721 07/19/22 0446 07/21/22 0444  HGB 15.1 15.1 14.2  HCT 45.7 45.5 44.2  WBC 9.7 9.9 6.9  PLT 190 187 193    COAGULATION No results for input(s): "INR" in the last 168 hours.  CARDIAC  No results for input(s): "TROPONINI" in the last 168 hours. No results for input(s): "PROBNP" in the last 168 hours.   CHEMISTRY Recent Labs  Lab 07/18/22 1721 07/19/22 0446 07/21/22 0444  NA 140 141 139  K 4.2 4.2 4.4  CL 103 100 99  CO2 31 32 33*  GLUCOSE 149* 173* 165*  BUN 13 16 30*  CREATININE 0.73 0.78 0.76  CALCIUM 8.5* 8.7* 8.9  MG  --   --  2.4  PHOS  --   --  3.8   Estimated Creatinine Clearance: 70.1 mL/min (by C-G formula based on SCr of 0.76 mg/dL).   LIVER Recent Labs  Lab 07/19/22 0446  AST 19  ALT 14  ALKPHOS 68  BILITOT 0.9  PROT 7.3  ALBUMIN 3.4*     INFECTIOUS No results for input(s): "LATICACIDVEN", "PROCALCITON" in the last 168 hours.   ENDOCRINE CBG (last 3)  Recent Labs    07/20/22 2025 07/21/22 0723 07/21/22 1147   GLUCAP 210* 198* 190*         IMAGING x24h  - image(s) personally visualized  -   highlighted in bold VAS 09/20/22 LOWER EXTREMITY VENOUS (DVT)  Result Date: 07/20/2022  Lower Venous DVT Study Patient Name:  Bradley ENYEART Sr.  Date of Exam:   07/20/2022 Medical Rec #: 09/[id number]/2022          Accession #:    [id number] Date of Birth: [birth year]           Patient Gender: M Patient Age:   81 years Exam Location:  [facility] Procedure:      VAS [facility] LOWER EXTREMITY VENOUS (DVT) Referring Phys: [place] --------------------------------------------------------------------------------  Indications: Swelling.  Risk Factors: None identified. Comparison Study: No prior studies. Performing Technologist: Canary Brim RVT  Examination Guidelines: A complete evaluation includes B-mode imaging, spectral Doppler, color Doppler, and power Doppler as needed of all accessible portions of each vessel. Bilateral testing is considered an integral part of a complete examination. Limited examinations for reoccurring indications may be performed as noted. The reflux portion of the exam is performed with the patient in reverse Trendelenburg.  +---------+---------------+---------+-----------+----------+--------------+ RIGHT    CompressibilityPhasicitySpontaneityPropertiesThrombus Aging +---------+---------------+---------+-----------+----------+--------------+ CFV  Full           Yes      Yes                                 +---------+---------------+---------+-----------+----------+--------------+ SFJ      Full                                                        +---------+---------------+---------+-----------+----------+--------------+ FV Prox  Full                                                        +---------+---------------+---------+-----------+----------+--------------+ FV Mid   Full                                                         +---------+---------------+---------+-----------+----------+--------------+ FV DistalFull                                                        +---------+---------------+---------+-----------+----------+--------------+ PFV      Full                                                        +---------+---------------+---------+-----------+----------+--------------+ POP      Full           Yes      Yes                                 +---------+---------------+---------+-----------+----------+--------------+ PTV      Full                                                        +---------+---------------+---------+-----------+----------+--------------+ PERO     Full                                                        +---------+---------------+---------+-----------+----------+--------------+   +---------+---------------+---------+-----------+----------+--------------+ LEFT     CompressibilityPhasicitySpontaneityPropertiesThrombus Aging +---------+---------------+---------+-----------+----------+--------------+ CFV      Full           Yes      Yes                                 +---------+---------------+---------+-----------+----------+--------------+  SFJ      Full                                                        +---------+---------------+---------+-----------+----------+--------------+ FV Prox  Full                                                        +---------+---------------+---------+-----------+----------+--------------+ FV Mid   Full                                                        +---------+---------------+---------+-----------+----------+--------------+ FV DistalFull                                                        +---------+---------------+---------+-----------+----------+--------------+ PFV      Full                                                         +---------+---------------+---------+-----------+----------+--------------+ POP      Full           Yes      Yes                                 +---------+---------------+---------+-----------+----------+--------------+ PTV      Full                                                        +---------+---------------+---------+-----------+----------+--------------+ PERO     Full                                                        +---------+---------------+---------+-----------+----------+--------------+     Summary: RIGHT: - There is no evidence of deep vein thrombosis in the lower extremity.  - No cystic structure found in the popliteal fossa.  LEFT: - There is no evidence of deep vein thrombosis in the lower extremity.  - No cystic structure found in the popliteal fossa.  *See table(s) above for measurements and observations. Electronically signed by Waverly Ferrari MD on 07/20/2022 at 6:18:54 PM.    Final       A: AECOPD with RV. improving  P: continue IV steroids and intense BD Walk in hallway on 4L [place] and asess Aim for home 07/22/22 Given aransa study consent -  but needs stablilkyt on 4L [place] to be part of study   OPD fullow - Dr Clois Comber   Anti-infectives (From admission, onward)    Start     Dose/Rate Route Frequency Ordered Stop   07/19/22 0045  doxycycline (VIBRA-TABS) tablet 100 mg        100 mg Oral Every 12 hours 07/18/22 2349 07/23/22 2159        Rest per NP/medical resident whose note is outlined above and that I agree with    Dr. Brand Males, M.D., [address] [facility].C.P Pulmonary and Critical Care Medicine Staff Physician [place] [facility] Pager: [phone], If no answer or between  15:00h - 7:00h: call [phone]  07/21/2022 1:56 PM

## 2022-07-21 NOTE — Progress Notes (Signed)
  Echocardiogram 2D Echocardiogram has been performed.  [name] 07/21/2022, 2:30 PM

## 2022-07-21 NOTE — Progress Notes (Signed)
Patient walked in hall with SB assist ~250 feet, tolerated well. Being his first time he wanted to be on 6L & only dropped to 91% & talking throughout walk. Will re-attempt on 4LNC

## 2022-07-21 NOTE — Progress Notes (Signed)
PROGRESS NOTE    Bradley SALES Sr.  CBJ:[id number] DOB: 1941/07/06 DOA: 07/18/2022  PCP: [name], MD   Brief Narrative:  This 81 years old male with PMH significant for chronic respiratory failure on 4 L of supplemental oxygen at baseline, diastolic CHF, COPD, atrial fibrillation s/p AV nodal ablation, prior history of intracranial bleed not on anticoagulation, pulmonary hypertension, coronary artery disease, hypertension, hyperlipidemia, type 2 diabetes presented in the ED with worsening shortness of breath.  Patient has developed runny nose, cough, nasal congestion as well as worsening shortness of breath for last 4 to 5 days.  Patient does report increased salt intake.  He was severely hypoxic with SPO2 of 81% on room air, requiring BiPAP on arrival.  Chest x-ray shows cardiomegaly and increased diffuse interstitial pattern concerning for CHF.  Patient was admitted for acute on chronic hypoxic and hypercapnic respiratory failure secondary to CHF and COPD exacerbation.  Assessment & Plan:   Principal Problem:   Acute on chronic diastolic heart failure (HCC) Active Problems:   Type 2 diabetes mellitus without complication, without long-term current use of insulin (HCC)   Essential hypertension   Atrial fibrillation (HCC)   Sinoatrial node dysfunction (HCC)   COPD GOLD 0 spirometry/ AB    Acute on chronic respiratory failure with hypoxia (HCC)   CAD (coronary artery disease)   Rhinovirus   Acute on chronic hypoxic and hypercapnic respiratory failure: Multifactorial Acute decompensated diastolic CHF / Acute COPD exacerbation: Rhinovirus+ Patient presented with worsening shortness of breath,cough and chest congestion. Patient uses 4 L of supplemental oxygen at baseline. He was severely hypoxic with SPO2 of 81% on room air requiring BiPAP on arrival. Chest x-ray shows cardiomegaly and CHF pattern. BNP 270 / Fluid retention appears to be intra-abdominal space, there is no pedal  edema. Continue IV Lasix 40 mg daily. Continue supplemental oxygen and wean as tolerated, was requiring HFNC @10  L Continue Solu-Medrol 92 mg q12 hr, transition to prednisone tomorrow. Monitor daily weight, intake output charting. Influenza negative, COVID-negative, Rhinovirus + 2D Echo 10/22 shows LVEF 55 to 60%. No RWMA Continue Xopenex in the setting of tachycardia. Continue doxycycline for COPD exacerbation. Pulmonology is consulted.  Medications adjusted.  Feeling much improved. He is weaned down to 5 L of supplemental oxygen.  CAD: Denies any chest pain.  Troponin negative.   Continue home medications(Coreg, Lipitor)   SA node dysfunction: S/p PPM V paced rhythm in 70s on EKG Continue to follow   Atrial fibrillation: Heart rate is well controlled.  S/p AV nodal ablation. Not on anticoagulation due to the history of bleed   Essential hypertension Blood pressure stable.   Type 2 diabetes without complication: Carb modified diet, obtain hemoglobin A1c.   Regular insulin sliding scale   DVT prophylaxis:  Code Status: Full code Family Communication: No family at bedside Disposition Plan:  Status is: Inpatient Remains inpatient appropriate because: Admitted for acute on chronic hypoxic and hypercapnic respiratory failure secondary to COPD and CHF exacerbation.  Patient is not medically clear requiring IV steroids and IV diuresis.  Anticipated discharge on 07/22/2022.   Consultants:  Pulmonology  Procedures: BiPAP Antimicrobials: Doxycycline  Subjective: Patient was seen and examined at bedside.  Overnight events noted. Patient reports feeling much improved.  He is weaned down to 4 L of supplemental oxygen,  He denies any chest pain, palpitation or dizziness.  Objective: Vitals:   07/20/22 2024 07/21/22 0403 07/21/22 0403 07/21/22 1150  BP: (!) 112/55  134/75 121/68  Pulse: 71  73 80  Resp: 17  17 19   Temp: 98.2 F (36.8 C)  98.2 F (36.8 C) 97.6 F  (36.4 C)  TempSrc: Oral  Oral Oral  SpO2: (!) 89%  99% 94%  Weight:  81.2 kg    Height:        Intake/Output Summary (Last 24 hours) at 07/21/2022 1341 Last data filed at 07/21/2022 1227 Gross per 24 hour  Intake 600 ml  Output 1050 ml  Net -450 ml   Filed Weights   07/18/22 1714 07/19/22 0500 07/21/22 0403  Weight: 83.9 kg 83.9 kg 81.2 kg    Examination:  General exam: Appears comfortable, not in any acute distress, deconditioned. Respiratory system: CTA bilaterally, no wheezing, no crackles, normal respiratory effort, RR 17. Cardiovascular system: S1-S2 heard, irregular rhythm, no murmur. Gastrointestinal system: Abdomen is soft, distended, non tender, BS+ Central nervous system: Alert and oriented X 3. No focal neurological deficits. Extremities: No edema, no cyanosis, no clubbing. Skin: No rashes, lesions or ulcers Psychiatry: Judgement and insight appear normal. Mood & affect appropriate.     Data Reviewed: I have personally reviewed following labs and imaging studies  CBC: Recent Labs  Lab 07/18/22 1721 07/19/22 0446 07/21/22 0444  WBC 9.7 9.9 6.9  NEUTROABS 6.6  --   --   HGB 15.1 15.1 14.2  HCT 45.7 45.5 44.2  MCV 92.0 92.9 94.2  PLT 190 187 193   Basic Metabolic Panel: Recent Labs  Lab 07/18/22 1721 07/19/22 0446 07/21/22 0444  NA 140 141 139  K 4.2 4.2 4.4  CL 103 100 99  CO2 31 32 33*  GLUCOSE 149* 173* 165*  BUN 13 16 30*  CREATININE 0.73 0.78 0.76  CALCIUM 8.5* 8.7* 8.9  MG  --   --  2.4  PHOS  --   --  3.8   GFR: Estimated Creatinine Clearance: 70.1 mL/min (by C-G formula based on SCr of 0.76 mg/dL). Liver Function Tests: Recent Labs  Lab 07/19/22 0446  AST 19  ALT 14  ALKPHOS 68  BILITOT 0.9  PROT 7.3  ALBUMIN 3.4*   No results for input(s): "LIPASE", "AMYLASE" in the last 168 hours. No results for input(s): "AMMONIA" in the last 168 hours. Coagulation Profile: No results for input(s): "INR", "PROTIME" in the last 168  hours. Cardiac Enzymes: No results for input(s): "CKTOTAL", "CKMB", "CKMBINDEX", "TROPONINI" in the last 168 hours. BNP (last 3 results) No results for input(s): "PROBNP" in the last 8760 hours. HbA1C: Recent Labs    07/19/22 0446  HGBA1C 6.2*   CBG: Recent Labs  Lab 07/20/22 1232 07/20/22 1646 07/20/22 2025 07/21/22 0723 07/21/22 1147  GLUCAP 133* 176* 210* 198* 190*   Lipid Profile: No results for input(s): "CHOL", "HDL", "LDLCALC", "TRIG", "CHOLHDL", "LDLDIRECT" in the last 72 hours. Thyroid Function Tests: No results for input(s): "TSH", "T4TOTAL", "FREET4", "T3FREE", "THYROIDAB" in the last 72 hours. Anemia Panel: No results for input(s): "VITAMINB12", "FOLATE", "FERRITIN", "TIBC", "IRON", "RETICCTPCT" in the last 72 hours. Sepsis Labs: No results for input(s): "PROCALCITON", "LATICACIDVEN" in the last 168 hours.  Recent Results (from the past 240 hour(s))  Resp Panel by RT-PCR (Flu A&B, Covid) Anterior Nasal Swab     Status: None   Collection Time: 07/18/22  5:21 PM   Specimen: Anterior Nasal Swab  Result Value Ref Range Status   SARS Coronavirus 2 by RT PCR NEGATIVE NEGATIVE Final    Comment: (NOTE) SARS-CoV-2 target nucleic acids are NOT DETECTED.  The SARS-CoV-2 RNA is generally detectable in upper respiratory specimens during the acute phase of infection. The lowest concentration of SARS-CoV-2 viral copies this assay can detect is 138 copies/mL. A negative result does not preclude SARS-Cov-2 infection and should not be used as the sole basis for treatment or other patient management decisions. A negative result may occur with  improper specimen collection/handling, submission of specimen other than nasopharyngeal swab, presence of viral mutation(s) within the areas targeted by this assay, and inadequate number of viral copies(<138 copies/mL). A negative result must be combined with clinical observations, patient history, and epidemiological information. The  expected result is Negative.  Fact Sheet for Patients:  EntrepreneurPulse.com.au  Fact Sheet for Healthcare Providers:  IncredibleEmployment.be  This test is no t yet approved or cleared by the [place] FDA and  has been authorized for detection and/or diagnosis of SARS-CoV-2 by FDA under an Emergency Use Authorization (EUA). This EUA will remain  in effect (meaning this test can be used) for the duration of the COVID-19 declaration under Section 564(b)(1) of the Act, 21 U.S.C.section 360bbb-3(b)(1), unless the authorization is terminated  or revoked sooner.       Influenza A by PCR NEGATIVE NEGATIVE Final   Influenza B by PCR NEGATIVE NEGATIVE Final    Comment: (NOTE) The Xpert Xpress SARS-CoV-2/FLU/RSV plus assay is intended as an aid in the diagnosis of influenza from Nasopharyngeal swab specimens and should not be used as a sole basis for treatment. Nasal washings and aspirates are unacceptable for Xpert Xpress SARS-CoV-2/FLU/RSV testing.  Fact Sheet for Patients: EntrepreneurPulse.com.au  Fact Sheet for Healthcare Providers: IncredibleEmployment.be  This test is not yet approved or cleared by the [place] FDA and has been authorized for detection and/or diagnosis of SARS-CoV-2 by FDA under an Emergency Use Authorization (EUA). This EUA will remain in effect (meaning this test can be used) for the duration of the COVID-19 declaration under Section 564(b)(1) of the Act, 21 U.S.C. section 360bbb-3(b)(1), unless the authorization is terminated or revoked.  Performed at [facility], [address] [place], [place] [address]   Respiratory (~20 pathogens) panel by PCR     Status: Abnormal   Collection Time: 07/19/22  6:21 AM   Specimen: Nasopharyngeal Swab; Respiratory  Result Value Ref Range Status   Adenovirus NOT DETECTED NOT DETECTED Final   Coronavirus 229E NOT DETECTED NOT  DETECTED Final    Comment: (NOTE) The Coronavirus on the Respiratory Panel, DOES NOT test for the novel  Coronavirus (2019 nCoV)    Coronavirus HKU1 NOT DETECTED NOT DETECTED Final   Coronavirus NL63 NOT DETECTED NOT DETECTED Final   Coronavirus OC43 NOT DETECTED NOT DETECTED Final   Metapneumovirus NOT DETECTED NOT DETECTED Final   Rhinovirus / Enterovirus DETECTED (A) NOT DETECTED Final   Influenza A NOT DETECTED NOT DETECTED Final   Influenza B NOT DETECTED NOT DETECTED Final   Parainfluenza Virus 1 NOT DETECTED NOT DETECTED Final   Parainfluenza Virus 2 NOT DETECTED NOT DETECTED Final   Parainfluenza Virus 3 NOT DETECTED NOT DETECTED Final   Parainfluenza Virus 4 NOT DETECTED NOT DETECTED Final   Respiratory Syncytial Virus NOT DETECTED NOT DETECTED Final   Bordetella pertussis NOT DETECTED NOT DETECTED Final   Bordetella Parapertussis NOT DETECTED NOT DETECTED Final   Chlamydophila pneumoniae NOT DETECTED NOT DETECTED Final   Mycoplasma pneumoniae NOT DETECTED NOT DETECTED Final    Comment: Performed at [facility], [address], [place], [place]  [address]     Radiology Studies: VAS [place] LOWER EXTREMITY VENOUS (DVT)  Result Date: 07/20/2022  Lower Venous DVT Study Patient Name:  Bradley REFUERZO Sr.  Date of Exam:   07/20/2022 Medical Rec #: [id number]          Accession #:    [id number] Date of Birth: Dec 24, 1940           Patient Gender: M Patient Age:   50 years Exam Location:  [facility] Procedure:      VAS [place] LOWER EXTREMITY VENOUS (DVT) Referring Phys: Canary Brim --------------------------------------------------------------------------------  Indications: Swelling.  Risk Factors: None identified. Comparison Study: No prior studies. Performing Technologist: Chanda Busing RVT  Examination Guidelines: A complete evaluation includes B-mode imaging, spectral Doppler, color Doppler, and power Doppler as needed of all accessible portions of each vessel. Bilateral  testing is considered an integral part of a complete examination. Limited examinations for reoccurring indications may be performed as noted. The reflux portion of the exam is performed with the patient in reverse Trendelenburg.  +---------+---------------+---------+-----------+----------+--------------+ RIGHT    CompressibilityPhasicitySpontaneityPropertiesThrombus Aging +---------+---------------+---------+-----------+----------+--------------+ CFV      Full           Yes      Yes                                 +---------+---------------+---------+-----------+----------+--------------+ SFJ      Full                                                        +---------+---------------+---------+-----------+----------+--------------+ FV Prox  Full                                                        +---------+---------------+---------+-----------+----------+--------------+ FV Mid   Full                                                        +---------+---------------+---------+-----------+----------+--------------+ FV DistalFull                                                        +---------+---------------+---------+-----------+----------+--------------+ PFV      Full                                                        +---------+---------------+---------+-----------+----------+--------------+ POP      Full           Yes      Yes                                 +---------+---------------+---------+-----------+----------+--------------+  PTV      Full                                                        +---------+---------------+---------+-----------+----------+--------------+ PERO     Full                                                        +---------+---------------+---------+-----------+----------+--------------+   +---------+---------------+---------+-----------+----------+--------------+ LEFT      CompressibilityPhasicitySpontaneityPropertiesThrombus Aging +---------+---------------+---------+-----------+----------+--------------+ CFV      Full           Yes      Yes                                 +---------+---------------+---------+-----------+----------+--------------+ SFJ      Full                                                        +---------+---------------+---------+-----------+----------+--------------+ FV Prox  Full                                                        +---------+---------------+---------+-----------+----------+--------------+ FV Mid   Full                                                        +---------+---------------+---------+-----------+----------+--------------+ FV DistalFull                                                        +---------+---------------+---------+-----------+----------+--------------+ PFV      Full                                                        +---------+---------------+---------+-----------+----------+--------------+ POP      Full           Yes      Yes                                 +---------+---------------+---------+-----------+----------+--------------+ PTV      Full                                                        +---------+---------------+---------+-----------+----------+--------------+  PERO     Full                                                        +---------+---------------+---------+-----------+----------+--------------+     Summary: RIGHT: - There is no evidence of deep vein thrombosis in the lower extremity.  - No cystic structure found in the popliteal fossa.  LEFT: - There is no evidence of deep vein thrombosis in the lower extremity.  - No cystic structure found in the popliteal fossa.  *See table(s) above for measurements and observations. Electronically signed by Waverly Ferrari MD on 07/20/2022 at 6:18:54 PM.    Final     Scheduled  Meds:  atorvastatin  20 mg Oral QHS   carvedilol  25 mg Oral BID WC   doxycycline  100 mg Oral Q12H   enoxaparin (LOVENOX) injection  40 mg Subcutaneous Q24H   furosemide  40 mg Oral QAC breakfast   guaiFENesin  1,200 mg Oral BID   insulin aspart  0-15 Units Subcutaneous TID WC   insulin aspart  0-5 Units Subcutaneous QHS   ipratropium  0.5 mg Nebulization Q4H WA   levalbuterol  0.63 mg Nebulization Q4H WA   methylPREDNISolone (SOLU-MEDROL) injection  94 mg Intravenous BID   pantoprazole  40 mg Oral QAC breakfast   sodium chloride flush  3 mL Intravenous Q12H   Continuous Infusions:  sodium chloride       LOS: 3 days    Time spent: 35 mins    Kwesi Sangha, MD [facility]   If 7PM-7AM, please contact night-coverage

## 2022-07-21 NOTE — Progress Notes (Signed)
NAME:  Bradley Carlone., MRN:  [id number], DOB:  1941-03-05, LOS: 3 ADMISSION DATE:  07/18/2022, CONSULTATION DATE:  07/20/22 REFERRING MD:  Dr. Dwyane Dee, CHIEF COMPLAINT:  Hypoxia   History of Present Illness:  81 year old M who presented as a transfer from [facility] with complaints of dyspnea, congestion, and cough that has worsened over the past week.  Initially, he was 81% on room air, however he was not wearing his home oxygen.  He wears 4-5L O2 baseline for his COPD.  He received 60 mg IV lasix, Solu-medrol, and DuoNebs in the ED. He has been afebrile.  His initial labs revealed WBC 9.7, Hgb 15, Creatinine 0.7.  Troponin negative x2.  BNP 270s.  Chest x-ray showed cardiomegaly and increased diffuse interstitial pattern concerning for CHF.  EKG with paced rhythm with rate in the 70s. Rhinovirus positive.  Patient states at home he is able to walk the dog, get the mail, go grocery shopping etc.  Prior to admission he would have to catch his breath after 20-30 feet.  He has a productive cough, sometimes produces green-tinged sputum. He watches his salt intake, only uses dash seasoning, no table salt.  Weighs himself occasionally but has not noted any excessive weight loss/gain. Compliant with medication adherence. He denies any sick contacts.   Since admission he has been trialed on BiPAP but not tolerating due to "suffocation". He has been maintained on 10L HFNC within inability to wean. 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   Pertinent  Medical History  COPD on 4L baseline Chronic respiratory failure HFrEF Atrial Fibrillation CAD HLD HTN Intracranial bleed Pacemaker PVD DM2 Pulmonary HTN  Significant Hospital Events: Including procedures, antibiotic start and stop dates in addition to other pertinent events   10/9 Admitted to [facility] 10/10 PCCM consulted for hypoxia requiring 10L HFNC  Interim History / Subjective:   Afebrile UOP 1.25 L On home 4L 02 Pt reports feeling much better, asking when he can go home  Objective   Blood pressure 134/75, pulse 73, temperature 98.2 F (36.8 C), temperature source Oral, resp. rate 17, height 5\' 8"  (1.727 m), weight 81.2 kg, SpO2 99 %.        Intake/Output Summary (Last 24 hours) at 07/21/2022 0751 Last data filed at 07/21/2022 0400 Gross per 24 hour  Intake 1200 ml  Output 1250 ml  Net -50 ml   Filed Weights   07/18/22 1714 07/19/22 0500 07/21/22 0403  Weight: 83.9 kg 83.9 kg 81.2 kg    Examination: General: chronically ill appearing elderly male sitting up in bed in NAD HENT: PERRL, moist, pink membranes, 4L O2 via salter Lungs: pursed lip breathing, prolonged exp phase, improved but wheezing bilaterally  Cardiovascular: RRR S1S2, no m/r/g Abdomen: Soft, non-tender, non-distended abdomen, tolerating PO's  Extremities: no edema, moves all extremities, chronic discoloration on BLE consistent with chronic venous stasis  Neuro: A&Ox4, non focal, MAE / normal strength  Resolved Hospital Problem list     Assessment & Plan:   Acute on Chronic Hypoxic and Hypercapnic Respiratory Failure secondary to Rhinovirus positive Acute COPD Exacerbation  Acute Decompensated CHF Baseline Restrictive Lung Disease Admitted with acute worsening of SOB, suspect rhinovirus in setting of underlying obstructive lung disease and CHF.   -continue flutter valve (even at discharge) -pulmonary hygiene  -mobilize as able  -at baseline O2 of 4-5L  -continue xopenex + atrovent Q4  -  continue doxycycline  -guaifenesin for secretion clearance  -solumedrol 60 mg IV q8, consider transition to prednisone in am  -Follow I/O's  -daily weights  -assess ambulatory O2 needs prior to discharge   For discharge:  -will need albuterol / atrovent nebs Q6 PRN  -continue pulmicort neb BID  -needs repeat PFT's as may need maintenance inhaler regimen  -prednisone taper over 7-10  days -follow up in Pulmonary Clinic 10/20 at 3pm with Roxan Diesel, NP / Dr. Melvyn Novas   Best Practice (right click and "Reselect all SmartList Selections" daily)  Per [place]   Critical care time:     Noe Gens, MSN, APRN, NP-C, AGACNP-BC [facility] 07/21/2022, 12:59 PM   Please see [email or web address] for pager details.   From 7A-7P if no response, please call [phone] After hours, please call ELink [phone]

## 2022-07-22 DIAGNOSIS — I5033 Acute on chronic diastolic (congestive) heart failure: Secondary | ICD-10-CM | POA: Diagnosis not present

## 2022-07-22 LAB — GLUCOSE, CAPILLARY
Glucose-Capillary: 161 mg/dL — ABNORMAL HIGH (ref 70–99)
Glucose-Capillary: 165 mg/dL — ABNORMAL HIGH (ref 70–99)
Glucose-Capillary: 179 mg/dL — ABNORMAL HIGH (ref 70–99)
Glucose-Capillary: 217 mg/dL — ABNORMAL HIGH (ref 70–99)
Glucose-Capillary: 192 mg/dL — ABNORMAL HIGH (ref 70–99)

## 2022-07-22 LAB — TROPONIN I (HIGH SENSITIVITY): Troponin I (High Sensitivity): 9 ng/L (ref <18)

## 2022-07-22 NOTE — Progress Notes (Signed)
PROGRESS NOTE    Bradley LOCKRIDGE Sr.  ZOX:[id number] DOB: 1941/08/04 DOA: 07/18/2022  PCP: [name], MD   Brief Narrative:  This 81 years old male with PMH significant for chronic respiratory failure on 4 L of supplemental oxygen at baseline, diastolic CHF, COPD, atrial fibrillation s/p AV nodal ablation, prior history of intracranial bleed not on anticoagulation, pulmonary hypertension, coronary artery disease, hypertension, hyperlipidemia, type 2 diabetes presented in the ED with worsening shortness of breath.  Patient has developed runny nose, cough, nasal congestion as well as worsening shortness of breath for last 4 to 5 days.  Patient does report increased salt intake.  He was severely hypoxic with SPO2 of 81% on room air, requiring BiPAP on arrival.  Chest x-ray shows cardiomegaly and increased diffuse interstitial pattern concerning for CHF.  Patient was admitted for acute on chronic hypoxic and hypercapnic respiratory failure secondary to CHF and COPD exacerbation.  Assessment & Plan:   Principal Problem:   Acute on chronic diastolic heart failure (HCC) Active Problems:   Type 2 diabetes mellitus without complication, without long-term current use of insulin (HCC)   Essential hypertension   Atrial fibrillation (HCC)   Sinoatrial node dysfunction (HCC)   COPD GOLD 0 spirometry/ AB    Acute on chronic respiratory failure with hypoxia (HCC)   CAD (coronary artery disease)   Rhinovirus   Acute on chronic hypoxic and hypercapnic respiratory failure: Multifactorial Acute decompensated diastolic CHF / Acute COPD exacerbation: Rhinovirus+ Patient presented with worsening shortness of breath, cough and chest congestion. Patient uses 4 L of supplemental oxygen at baseline. He was severely hypoxic with SPO2 of 81% on room air requiring BiPAP on arrival. Chest x-ray shows cardiomegaly and CHF pattern. BNP 270 / Fluid retention appears to be intra-abdominal space, there is no pedal  edema. Continue IV Lasix 40 mg daily. Continue supplemental oxygen and wean as tolerated, was requiring HFNC @10  L Continue Solu-Medrol 92 mg q12 hr, transition to prednisone tomorrow. Monitor daily weight, intake output charting. Influenza negative, COVID-negative, Rhinovirus + 2D Echo 10/22 shows LVEF 55 to 60%. No RWMA Continue Xopenex in the setting of tachycardia. Continue doxycycline for COPD exacerbation. Pulmonology is consulted.  Medications adjusted.  Feeling much improved. He is weaned down to 5 L of supplemental oxygen.  CAD: Denies any chest pain.  Troponin negative.   Continue home medications(Coreg, Lipitor)   SA node dysfunction: S/p PPM V paced rhythm in 70s on EKG Continue to follow   Atrial fibrillation: Heart rate is well controlled.  S/p AV nodal ablation. Not on anticoagulation due to the history of bleed   Essential hypertension Blood pressure stable.   Type 2 diabetes without complication: Carb modified diet, obtain hemoglobin A1c.   Regular insulin sliding scale   DVT prophylaxis:  Code Status: Full code Family Communication: No family at bedside Disposition Plan:  Status is: Inpatient Remains inpatient appropriate because: Admitted for acute on chronic hypoxic and hypercapnic respiratory failure secondary to COPD and CHF exacerbation.  Patient is not medically clear requiring IV steroids and IV diuresis.  Anticipated discharge on 07/23/2022.   Consultants:  Pulmonology  Procedures: BiPAP Antimicrobials: Doxycycline  Subjective: Patient was seen and examined at bedside.  Overnight events noted. Patient reports feeling slightly better.  He has been down to 5 L of supplemental oxygen. He reports having palpitations in the left chest,  denies any chest pain or shortness of breath.  Objective: Vitals:   07/21/22 2145 07/22/22 0458 07/22/22 0500  07/22/22 1243  BP: 119/70 128/70  125/69  Pulse: 69 75  70  Resp: 20 19  17   Temp: 98.5 F  (36.9 C) 98.2 F (36.8 C)  97.8 F (36.6 C)  TempSrc: Oral Oral  Oral  SpO2: (!) 89% 93%  94%  Weight:   81.4 kg   Height:        Intake/Output Summary (Last 24 hours) at 07/22/2022 1356 Last data filed at 07/22/2022 0838 Gross per 24 hour  Intake 480 ml  Output 675 ml  Net -195 ml   Filed Weights   07/19/22 0500 07/21/22 0403 07/22/22 0500  Weight: 83.9 kg 81.2 kg 81.4 kg    Examination:  General exam: Appears comfortable, not in any acute distress, deconditioned. Respiratory system: CTA bilaterally, no wheezing, no crackles, normal respiratory effort, RR 16 Cardiovascular system: S1-S2 heard, irregular rhythm, no murmur. Gastrointestinal system: Abdomen is soft, distended, nontender, BS+ Central nervous system: Alert and oriented X 3. No focal neurological deficits. Extremities: No edema, no cyanosis, no clubbing. Skin: No rashes, lesions or ulcers Psychiatry: Judgement and insight appear normal. Mood & affect appropriate.     Data Reviewed: I have personally reviewed following labs and imaging studies  CBC: Recent Labs  Lab 07/18/22 1721 07/19/22 0446 07/21/22 0444  WBC 9.7 9.9 6.9  NEUTROABS 6.6  --   --   HGB 15.1 15.1 14.2  HCT 45.7 45.5 44.2  MCV 92.0 92.9 94.2  PLT 190 187 193   Basic Metabolic Panel: Recent Labs  Lab 07/18/22 1721 07/19/22 0446 07/21/22 0444  NA 140 141 139  K 4.2 4.2 4.4  CL 103 100 99  CO2 31 32 33*  GLUCOSE 149* 173* 165*  BUN 13 16 30*  CREATININE 0.73 0.78 0.76  CALCIUM 8.5* 8.7* 8.9  MG  --   --  2.4  PHOS  --   --  3.8   GFR: Estimated Creatinine Clearance: 70.1 mL/min (by C-G formula based on SCr of 0.76 mg/dL). Liver Function Tests: Recent Labs  Lab 07/19/22 0446  AST 19  ALT 14  ALKPHOS 68  BILITOT 0.9  PROT 7.3  ALBUMIN 3.4*   No results for input(s): "LIPASE", "AMYLASE" in the last 168 hours. No results for input(s): "AMMONIA" in the last 168 hours. Coagulation Profile: No results for input(s):  "INR", "PROTIME" in the last 168 hours. Cardiac Enzymes: No results for input(s): "CKTOTAL", "CKMB", "CKMBINDEX", "TROPONINI" in the last 168 hours. BNP (last 3 results) No results for input(s): "PROBNP" in the last 8760 hours. HbA1C: No results for input(s): "HGBA1C" in the last 72 hours.  CBG: Recent Labs  Lab 07/21/22 1147 07/21/22 1624 07/21/22 2143 07/22/22 0752 07/22/22 1148  GLUCAP 190* 120* 233* 161* 165*   Lipid Profile: No results for input(s): "CHOL", "HDL", "LDLCALC", "TRIG", "CHOLHDL", "LDLDIRECT" in the last 72 hours. Thyroid Function Tests: No results for input(s): "TSH", "T4TOTAL", "FREET4", "T3FREE", "THYROIDAB" in the last 72 hours. Anemia Panel: No results for input(s): "VITAMINB12", "FOLATE", "FERRITIN", "TIBC", "IRON", "RETICCTPCT" in the last 72 hours. Sepsis Labs: No results for input(s): "PROCALCITON", "LATICACIDVEN" in the last 168 hours.  Recent Results (from the past 240 hour(s))  Resp Panel by RT-PCR (Flu A&B, Covid) Anterior Nasal Swab     Status: None   Collection Time: 07/18/22  5:21 PM   Specimen: Anterior Nasal Swab  Result Value Ref Range Status   SARS Coronavirus 2 by RT PCR NEGATIVE NEGATIVE Final    Comment: (NOTE) SARS-CoV-2  target nucleic acids are NOT DETECTED.  The SARS-CoV-2 RNA is generally detectable in upper respiratory specimens during the acute phase of infection. The lowest concentration of SARS-CoV-2 viral copies this assay can detect is 138 copies/mL. A negative result does not preclude SARS-Cov-2 infection and should not be used as the sole basis for treatment or other patient management decisions. A negative result may occur with  improper specimen collection/handling, submission of specimen other than nasopharyngeal swab, presence of viral mutation(s) within the areas targeted by this assay, and inadequate number of viral copies(<138 copies/mL). A negative result must be combined with clinical observations, patient  history, and epidemiological information. The expected result is Negative.  Fact Sheet for Patients:  BloggerCourse.com  Fact Sheet for Healthcare Providers:  SeriousBroker.it  This test is no t yet approved or cleared by the [place] FDA and  has been authorized for detection and/or diagnosis of SARS-CoV-2 by FDA under an Emergency Use Authorization (EUA). This EUA will remain  in effect (meaning this test can be used) for the duration of the COVID-19 declaration under Section 564(b)(1) of the Act, 21 U.S.C.section 360bbb-3(b)(1), unless the authorization is terminated  or revoked sooner.       Influenza A by PCR NEGATIVE NEGATIVE Final   Influenza B by PCR NEGATIVE NEGATIVE Final    Comment: (NOTE) The Xpert Xpress SARS-CoV-2/FLU/RSV plus assay is intended as an aid in the diagnosis of influenza from Nasopharyngeal swab specimens and should not be used as a sole basis for treatment. Nasal washings and aspirates are unacceptable for Xpert Xpress SARS-CoV-2/FLU/RSV testing.  Fact Sheet for Patients: BloggerCourse.com  Fact Sheet for Healthcare Providers: SeriousBroker.it  This test is not yet approved or cleared by the [place] FDA and has been authorized for detection and/or diagnosis of SARS-CoV-2 by FDA under an Emergency Use Authorization (EUA). This EUA will remain in effect (meaning this test can be used) for the duration of the COVID-19 declaration under Section 564(b)(1) of the Act, 21 U.S.C. section 360bbb-3(b)(1), unless the authorization is terminated or revoked.  Performed at [facility], [address] [place], [place] [address]   Respiratory (~20 pathogens) panel by PCR     Status: Abnormal   Collection Time: 07/19/22  6:21 AM   Specimen: Nasopharyngeal Swab; Respiratory  Result Value Ref Range Status   Adenovirus NOT DETECTED NOT  DETECTED Final   Coronavirus 229E NOT DETECTED NOT DETECTED Final    Comment: (NOTE) The Coronavirus on the Respiratory Panel, DOES NOT test for the novel  Coronavirus (2019 nCoV)    Coronavirus HKU1 NOT DETECTED NOT DETECTED Final   Coronavirus NL63 NOT DETECTED NOT DETECTED Final   Coronavirus OC43 NOT DETECTED NOT DETECTED Final   Metapneumovirus NOT DETECTED NOT DETECTED Final   Rhinovirus / Enterovirus DETECTED (A) NOT DETECTED Final   Influenza A NOT DETECTED NOT DETECTED Final   Influenza B NOT DETECTED NOT DETECTED Final   Parainfluenza Virus 1 NOT DETECTED NOT DETECTED Final   Parainfluenza Virus 2 NOT DETECTED NOT DETECTED Final   Parainfluenza Virus 3 NOT DETECTED NOT DETECTED Final   Parainfluenza Virus 4 NOT DETECTED NOT DETECTED Final   Respiratory Syncytial Virus NOT DETECTED NOT DETECTED Final   Bordetella pertussis NOT DETECTED NOT DETECTED Final   Bordetella Parapertussis NOT DETECTED NOT DETECTED Final   Chlamydophila pneumoniae NOT DETECTED NOT DETECTED Final   Mycoplasma pneumoniae NOT DETECTED NOT DETECTED Final    Comment: Performed at [facility]  Lab, [address], [place], [place] [address]     Radiology Studies: ECHOCARDIOGRAM COMPLETE  Result Date: 07/21/2022    ECHOCARDIOGRAM REPORT   Patient Name:   Bradley VALENTINO Sr. Date of Exam: 07/21/2022 Medical Rec #:  [id number]         Height:       68.0 in Accession #:    [id number]        Weight:       179.1 lb Date of Birth:  08/29/1941          BSA:          1.950 m Patient Age:    81 years          BP:           134/75 mmHg Patient Gender: M                 HR:           72 bpm. Exam Location:  Inpatient Procedure: 2D Echo Indications:    acute respiratory distress  History:        Patient has prior history of Echocardiogram examinations, most                 recent 07/30/2021. CHF, CAD, COPD, Arrythmias:Atrial                 Fibrillation; Risk Factors:Hypertension and Diabetes.  Sonographer:    Cathie Hoops  Referring Phys: [id number] Jeanella Craze  Sonographer Comments: Suboptimal parasternal window and no subcostal window. Image acquisition challenging due to respiratory motion and Image acquisition challenging due to COPD. IMPRESSIONS  1. Left ventricular ejection fraction, by estimation, is 55 to 60%. The left ventricle has normal function. The left ventricle has no regional wall motion abnormalities. There is mild concentric left ventricular hypertrophy. Left ventricular diastolic parameters are indeterminate.  2. Peak RV-RA gradient 43 mmHg. Right ventricular systolic function is moderately reduced. The right ventricular size is mildly enlarged.  3. Left atrial size was mildly dilated.  4. Right atrial size was mildly dilated.  5. The mitral valve is normal in structure. Trivial mitral valve regurgitation. No evidence of mitral stenosis.  6. The aortic valve is tricuspid. There is moderate calcification of the aortic valve. Aortic valve regurgitation is not visualized. Aortic valve sclerosis/calcification is present, without any evidence of aortic stenosis. Aortic valve mean gradient measures 6.0 mmHg.  7. The IVC was not visualized. FINDINGS  Left Ventricle: Left ventricular ejection fraction, by estimation, is 55 to 60%. The left ventricle has normal function. The left ventricle has no regional wall motion abnormalities. The left ventricular internal cavity size was normal in size. There is  mild concentric left ventricular hypertrophy. Left ventricular diastolic parameters are indeterminate. Right Ventricle: Peak RV-RA gradient 43 mmHg. The right ventricular size is mildly enlarged. No increase in right ventricular wall thickness. Right ventricular systolic function is moderately reduced. Left Atrium: Left atrial size was mildly dilated. Right Atrium: Right atrial size was mildly dilated. Pericardium: There is no evidence of pericardial effusion. Mitral Valve: The mitral valve is normal in structure. There is mild  calcification of the mitral valve leaflet(s). Mild mitral annular calcification. Trivial mitral valve regurgitation. No evidence of mitral valve stenosis. Tricuspid Valve: The tricuspid valve is normal in structure. Tricuspid valve regurgitation is mild. Aortic Valve: The aortic valve is tricuspid. There is moderate calcification of the aortic valve. Aortic valve regurgitation is not visualized. Aortic valve sclerosis/calcification  is present, without any evidence of aortic stenosis. Aortic valve mean gradient measures 6.0 mmHg. Aortic valve peak gradient measures 11.3 mmHg. Aortic valve area, by VTI measures 1.94 cm. Pulmonic Valve: The pulmonic valve was normal in structure. Pulmonic valve regurgitation is not visualized. Aorta: The aortic root is normal in size and structure. IAS/Shunts: No atrial level shunt detected by color flow Doppler.  LEFT VENTRICLE PLAX 2D LVIDd:         4.00 cm     Diastology LVIDs:         2.40 cm     LV e' medial:    7.18 cm/s LV PW:         1.10 cm     LV E/e' medial:  14.1 LV IVS:        1.10 cm     LV e' lateral:   9.14 cm/s LVOT diam:     2.00 cm     LV E/e' lateral: 11.1 LV SV:         61 LV SV Index:   31 LVOT Area:     3.14 cm  LV Volumes (MOD) LV vol d, MOD A2C: 86.7 ml LV vol d, MOD A4C: 76.8 ml LV vol s, MOD A2C: 38.7 ml LV vol s, MOD A4C: 32.6 ml LV SV MOD A2C:     48.0 ml LV SV MOD A4C:     76.8 ml LV SV MOD BP:      48.6 ml RIGHT VENTRICLE RV S prime:     10.10 cm/s TAPSE (M-mode): 1.5 cm LEFT ATRIUM             Index        RIGHT ATRIUM           Index LA diam:        3.80 cm 1.95 cm/m   RA Area:     23.50 cm LA Vol (A2C):   59.9 ml 30.72 ml/m  RA Volume:   68.50 ml  35.13 ml/m LA Vol (A4C):   55.9 ml 28.67 ml/m LA Biplane Vol: 58.2 ml 29.84 ml/m  AORTIC VALVE AV Area (Vmax):    2.08 cm AV Area (Vmean):   1.87 cm AV Area (VTI):     1.94 cm AV Vmax:           168.00 cm/s AV Vmean:          110.000 cm/s AV VTI:            0.316 m AV Peak Grad:      11.3 mmHg AV  Mean Grad:      6.0 mmHg LVOT Vmax:         111.00 cm/s LVOT Vmean:        65.600 cm/s LVOT VTI:          0.195 m LVOT/AV VTI ratio: 0.62  AORTA Ao Root diam: 3.20 cm Ao Asc diam:  3.10 cm MITRAL VALVE                TRICUSPID VALVE MV Area (PHT): 4.21 cm     TR Peak grad:   43.6 mmHg MV Decel Time: 180 msec     TR Vmax:        330.00 cm/s MR Peak grad: 33.4 mmHg MR Vmax:      289.00 cm/s   SHUNTS MV E velocity: 101.00 cm/s  Systemic VTI:  0.20 m MV A velocity: 28.30 cm/s   Systemic Diam: 2.00  cm MV E/A ratio:  3.57 Dalton McleanMD Electronically signed by Franki Monte Signature Date/Time: 07/21/2022/3:32:33 PM    Final    VAS [place] LOWER EXTREMITY VENOUS (DVT)  Result Date: 07/20/2022  Lower Venous DVT Study Patient Name:  Bradley JELINSKI Sr.  Date of Exam:   07/20/2022 Medical Rec #: [id number]          Accession #:    [id number] Date of Birth: 1940/10/14           Patient Gender: M Patient Age:   69 years Exam Location:  [facility] Procedure:      VAS [place] LOWER EXTREMITY VENOUS (DVT) Referring Phys: Noe Gens --------------------------------------------------------------------------------  Indications: Swelling.  Risk Factors: None identified. Comparison Study: No prior studies. Performing Technologist: Oliver Hum RVT  Examination Guidelines: A complete evaluation includes B-mode imaging, spectral Doppler, color Doppler, and power Doppler as needed of all accessible portions of each vessel. Bilateral testing is considered an integral part of a complete examination. Limited examinations for reoccurring indications may be performed as noted. The reflux portion of the exam is performed with the patient in reverse Trendelenburg.  +---------+---------------+---------+-----------+----------+--------------+ RIGHT    CompressibilityPhasicitySpontaneityPropertiesThrombus Aging +---------+---------------+---------+-----------+----------+--------------+ CFV      Full           Yes      Yes                                  +---------+---------------+---------+-----------+----------+--------------+ SFJ      Full                                                        +---------+---------------+---------+-----------+----------+--------------+ FV Prox  Full                                                        +---------+---------------+---------+-----------+----------+--------------+ FV Mid   Full                                                        +---------+---------------+---------+-----------+----------+--------------+ FV DistalFull                                                        +---------+---------------+---------+-----------+----------+--------------+ PFV      Full                                                        +---------+---------------+---------+-----------+----------+--------------+ POP      Full           Yes      Yes                                 +---------+---------------+---------+-----------+----------+--------------+  PTV      Full                                                        +---------+---------------+---------+-----------+----------+--------------+ PERO     Full                                                        +---------+---------------+---------+-----------+----------+--------------+   +---------+---------------+---------+-----------+----------+--------------+ LEFT     CompressibilityPhasicitySpontaneityPropertiesThrombus Aging +---------+---------------+---------+-----------+----------+--------------+ CFV      Full           Yes      Yes                                 +---------+---------------+---------+-----------+----------+--------------+ SFJ      Full                                                        +---------+---------------+---------+-----------+----------+--------------+ FV Prox  Full                                                         +---------+---------------+---------+-----------+----------+--------------+ FV Mid   Full                                                        +---------+---------------+---------+-----------+----------+--------------+ FV DistalFull                                                        +---------+---------------+---------+-----------+----------+--------------+ PFV      Full                                                        +---------+---------------+---------+-----------+----------+--------------+ POP      Full           Yes      Yes                                 +---------+---------------+---------+-----------+----------+--------------+ PTV      Full                                                        +---------+---------------+---------+-----------+----------+--------------+  PERO     Full                                                        +---------+---------------+---------+-----------+----------+--------------+     Summary: RIGHT: - There is no evidence of deep vein thrombosis in the lower extremity.  - No cystic structure found in the popliteal fossa.  LEFT: - There is no evidence of deep vein thrombosis in the lower extremity.  - No cystic structure found in the popliteal fossa.  *See table(s) above for measurements and observations. Electronically signed by Waverly Ferrari MD on 07/20/2022 at 6:18:54 PM.    Final     Scheduled Meds:  atorvastatin  20 mg Oral QHS   carvedilol  25 mg Oral BID WC   doxycycline  100 mg Oral Q12H   enoxaparin (LOVENOX) injection  40 mg Subcutaneous Q24H   furosemide  40 mg Oral QAC breakfast   guaiFENesin  1,200 mg Oral BID   insulin aspart  0-15 Units Subcutaneous TID WC   insulin aspart  0-5 Units Subcutaneous QHS   ipratropium  0.5 mg Nebulization Q6H WA   levalbuterol  0.63 mg Nebulization Q6H WA   methylPREDNISolone (SOLU-MEDROL) injection  94 mg Intravenous BID   pantoprazole  40 mg Oral QAC  breakfast   sodium chloride flush  3 mL Intravenous Q12H   Continuous Infusions:  sodium chloride       LOS: 4 days    Time spent: 35 mins    Arthi Mcdonald, MD [facility]   If 7PM-7AM, please contact night-coverage

## 2022-07-22 NOTE — Care Management Important Message (Signed)
Important Message  Patient Details IM Letter given to the Patient. Name: Bradley REFFNER Sr. MRN: [id number] Date of Birth: Aug 29, 1941   Medicare Important Message Given:  Yes     Kerin Salen 07/22/2022, 11:50 AM

## 2022-07-22 NOTE — Progress Notes (Addendum)
Patient walked in the hall with 4 liters of oxygen. Oxygen sat was 90% upon walking, tolerated well. At 250 feet, the oxygen saturation dropped to 87%. It ranges from 87%-91%. He walked a total of 500 ft well tolerated.

## 2022-07-23 DIAGNOSIS — I5033 Acute on chronic diastolic (congestive) heart failure: Secondary | ICD-10-CM | POA: Diagnosis not present

## 2022-07-23 DIAGNOSIS — J9621 Acute and chronic respiratory failure with hypoxia: Secondary | ICD-10-CM | POA: Diagnosis not present

## 2022-07-23 DIAGNOSIS — J441 Chronic obstructive pulmonary disease with (acute) exacerbation: Secondary | ICD-10-CM | POA: Diagnosis not present

## 2022-07-23 LAB — BASIC METABOLIC PANEL
Anion gap: 8 (ref 5–15)
BUN: 23 mg/dL (ref 8–23)
CO2: 33 mmol/L — ABNORMAL HIGH (ref 22–32)
Calcium: 8.4 mg/dL — ABNORMAL LOW (ref 8.9–10.3)
Chloride: 100 mmol/L (ref 98–111)
Creatinine, Ser: 0.72 mg/dL (ref 0.61–1.24)
GFR, Estimated: >60 mL/min (ref >60)
Glucose, Bld: 258 mg/dL — ABNORMAL HIGH (ref 70–99)
Potassium: 4.2 mmol/L (ref 3.5–5.1)
Sodium: 141 mmol/L (ref 135–145)

## 2022-07-23 LAB — GLUCOSE, CAPILLARY
Glucose-Capillary: 188 mg/dL — ABNORMAL HIGH (ref 70–99)
Glucose-Capillary: 142 mg/dL — ABNORMAL HIGH (ref 70–99)

## 2022-07-23 MED ORDER — BISOPROLOL FUMARATE 10 MG PO TABS: 10.0000 mg | ORAL_TABLET | Freq: Every day | ORAL | 1 refills | Status: DC

## 2022-07-23 MED ORDER — PREDNISONE 20 MG PO TABS: 40.0000 mg | ORAL_TABLET | Freq: Every day | ORAL | Status: DC

## 2022-07-23 MED ORDER — PREDNISONE 10 MG PO TABS: ORAL_TABLET | ORAL | 0 refills | Status: DC

## 2022-07-23 MED ORDER — BISOPROLOL FUMARATE 5 MG PO TABS: 10.0000 mg | ORAL_TABLET | Freq: Every day | ORAL | Status: DC

## 2022-07-23 MED ORDER — GUAIFENESIN ER 600 MG PO TB12: 1200.0000 mg | ORAL_TABLET | Freq: Two times a day (BID) | ORAL | 0 refills | Status: AC

## 2022-07-23 NOTE — Progress Notes (Signed)
NAME:  Bradley [name]., MRN:  [id number], DOB:  Nov 30, 1940, LOS: 5 ADMISSION DATE:  07/18/2022, CONSULTATION DATE:  07/20/22 REFERRING MD:  Dr. Dwyane Dee, CHIEF COMPLAINT:  Hypoxia   BRIEF  81 year old M who presented as a transfer from [facility] with complaints of dyspnea, congestion, and cough that has worsened over the past week.  Initially, he was 81% on room air, however he was not wearing his home oxygen.  He wears 4-5L O2 baseline for his COPD.  He received 60 mg IV lasix, Solu-medrol, and DuoNebs in the ED. He has been afebrile.  His initial labs revealed WBC 9.7, Hgb 15, Creatinine 0.7.  Troponin negative x2.  BNP 270s.  Chest x-ray showed cardiomegaly and increased diffuse interstitial pattern concerning for CHF.  EKG with paced rhythm with rate in the 70s. Rhinovirus positive.  Patient states at home he is able to walk the dog, get the mail, go grocery shopping etc.  Prior to admission he would have to catch his breath after 20-30 feet.  He has a productive cough, sometimes produces green-tinged sputum. He watches his salt intake, only uses dash seasoning, no table salt.  Weighs himself occasionally but has not noted any excessive weight loss/gain. Compliant with medication adherence. He denies any sick contacts.   Since admission he has been trialed on BiPAP but not tolerating due to "suffocation". He has been maintained on 10L HFNC within inability to wean. 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   Pertinent  Medical History  COPD on 4L baseline Chronic respiratory failure HFrEF Atrial Fibrillation CAD HLD HTN Intracranial bleed Pacemaker PVD DM2 Pulmonary HTN     Latest Ref Rng & Units 09/18/2020    8:34 AM 10/18/2014    1:02 PM  PFT Results  FVC-Pre L 1.83  2.49  P  FVC-Predicted Pre % 49  63  P  FVC-Post L 1.89  2.32  P  FVC-Predicted Post % 50  58  P  Pre FEV1/FVC % % 73  68  P  Post FEV1/FCV % % 75  72   P  FEV1-Pre L 1.33  1.71  P  FEV1-Predicted Pre % 50  59  P  FEV1-Post L 1.41  1.68  P  DLCO uncorrected ml/min/mmHg 11.58  15.84  P  DLCO UNC% % 50  53  P  DLCO corrected ml/min/mmHg 11.58    DLCO COR %Predicted % 50    DLVA Predicted % 119  85  P  TLC L 4.82  5.03  P  TLC % Predicted % 72  75  P  RV % Predicted % 117  107  P    P Preliminary result     Significant Hospital Events: Including procedures, antibiotic start and stop dates in addition to other pertinent events   10/9 Admitted to [facility] 10/10 PCCM consulted for hypoxia requiring 10L HFNC 07/21/22 - Afebrile, UOP 1.25 L., On home 4L 02, Pt reports feeling much better, asking when he can go home  Interim History / Subjective:   07/23/22 -2 days ago was able to walk 250 feet on 4 L nasal cannula for desaturating to 87%.  This is around his baseline.  Currently is on 5 L nasal cannula and feels ready enough to go home.  He dramatically improved after increasing his steroid dose and bronchodilator regimen to more aggressive regimen.  Nevertheless his pulse ox  was also artifactually low and once we had a better probe monitor he was resting 93% on 4 L nasal cannula.  He is looking forward to going home and he feels his baseline.  According to the nurse practitioner for some controversy whether he has COPD or not.  Review of his PFTs from 2016 does show obstructive pattern but later in 2017 this was more restrictive.  He has had CT scan of the chest with intermittent infiltrates which we do not know if those are infiltrates in the acute setting of the chronic and therefore it is possible that he has mixed obstructive and restrictive lung disease.  Of note he is on carvedilol and according to the hospitalist this is for hypertension.   Objective   Blood pressure 130/82, pulse 73, temperature 97.7 F (36.5 C), resp. rate 14, height 5\' 8"  (1.727 m), weight 78.6 kg, SpO2 97 %.    FiO2 (%):  [40 %] 40 %   Intake/Output Summary (Last  24 hours) at 07/23/2022 1150 Last data filed at 07/23/2022 1122 Gross per 24 hour  Intake 483 ml  Output 2050 ml  Net -1567 ml   Filed Weights   07/21/22 0403 07/22/22 0500 07/23/22 0443  Weight: 81.2 kg 81.4 kg 78.6 kg    Examination: Barrel chested male with classic pursed lip breathing consistent with COPD.  He has some wheezing in the bilateral lower bases but he is looking better.  Alert and oriented x3.  No wheezing anteriorly.  Normal heart sounds abdomen soft no sinus no clubbing no edema.  Resolved Hospital Problem list     Assessment & Plan:   Acute on Chronic Hypoxic and Hypercapnic Respiratory Failure secondary to Rhinovirus positive Acute COPD Exacerbation  Acute Decompensated CHF Baseline Restrictive Lung Disease Admitted with acute worsening of SOB, suspect rhinovirus in setting of underlying obstructive lung disease and CHF.    10/13./23 -based on his barrel chest and personal breathing and history of smoking and current wheezing there is no doubt that he has COPD and acute exacerbation of COPD.  He might have underlying ILD in the form of groundglass opacities based on previous imaging and this needs to be sorted out as an outpatient    PLAN  For discharge:  -will need albuterol / atrovent nebs Q6H scheduled -continue pulmicort neb BID  - o2 4-5L [place] continues 24/7 -Stop Solu-Medrol and start prednisone at 40 mg/day and taper over the next 12 days -change coreg to bisoprolol 10mg  daily for hyperrtension (due to better B1 specificity)  -follow up in [facility]monary [facility] 10/20 at 3pm with Roxan Diesel, NP / Dr. Melvyn Novas   -Katie will arrange high-resolution CT scan of the chest in 2 months  -Katie also ensure that he has for pulmonary function test performed in the next few months  -If he has ILD consider referral to the ILD center at the practice.  Best Practice (right click and "Reselect all SmartList Selections" daily)  Per [facility]   Discussed with Dr. Dwyane Dee  hospitalist  Future Appointments  Date Time Provider [place]  07/26/2022 10:30 AM [facility] PHARMACIST [facility]  07/29/2022  7:00 AM [facility] DEVICE REMOTES [facility]  07/30/2022  3:00 PM Cobb, Karie Schwalbe, NP [facility] None  08/30/2022  7:00 AM [facility] DEVICE REMOTES [facility]  09/13/2022 10:20 AM Skeet Latch, MD [facility]  09/30/2022  7:00 AM [facility] DEVICE REMOTES [facility]  11/15/2022 10:00 AM Colon Branch, MD [facility]  SIGNATURE    Dr. Brand Males, M.D., F.C.C.P,  Pulmonary and Critical Care Medicine Staff Physician, [place] Director - Interstitial Lung Disease  Program  Medical Director - [facility] [place] [facility] [place], [place], [address]   Pager: [phone], If no answer  -[place] or [place] [phone] Telephone (clinical office): [phone] Telephone (research): [phone]  12:00 PM 07/23/2022

## 2022-07-23 NOTE — Discharge Instructions (Signed)
Advised to follow-up with primary care physician in 1 week. Advised to follow-up with pulmonology Dr. Christinia Gully in 2 weeks. Advised to continue prednisone taper for 7 to 10 days is directed.

## 2022-07-23 NOTE — Discharge Summary (Signed)
Physician Discharge Summary  Bradley NJIE Sr. KDX:[id number] DOB: Dec 23, 1940 DOA: 07/18/2022  PCP: [name], MD  Admit date: 07/18/2022  Discharge date: 07/23/2022  Admitted From: Home.  Disposition:  Home.  Recommendations for Outpatient Follow-up:  Follow up with PCP in 1-2 weeks. Please obtain BMP/CBC in one week. Advised to follow-up with pulmonology Dr. Sandrea Hughs in 2 weeks. Advised to continue prednisone taper for 7 to 10 days as directed.  Home Health:None Equipment/Devices: Home oxygen@4 -5 L/min.  Discharge Condition: Stable CODE STATUS: Full code Diet recommendation: Heart Healthy   Brief [facility] Course: This 81 years old male with PMH significant for chronic respiratory failure on 4 L of supplemental oxygen at baseline, diastolic CHF, Advanced COPD, atrial fibrillation s/p AV nodal ablation, prior history of intracranial bleed not on anticoagulation, pulmonary hypertension, coronary artery disease, hypertension, hyperlipidemia, type 2 diabetes presented in the ED with worsening shortness of breath.  Patient has developed runny nose, cough, nasal congestion as well as worsening shortness of breath for last 4 to 5 days.  Patient does report increased salt intake.  He was severely hypoxic with SPO2 of 81% on room air, requiring BiPAP on arrival.  Chest x-ray shows cardiomegaly and increased diffuse interstitial pattern concerning for CHF.  Patient was admitted for acute on chronic hypoxic and hypercapnic respiratory failure secondary to CHF and COPD exacerbation.  Patient was continued on diuresis, IV Solu-Medrol scheduled and as needed bronchodilators.  Patient could not tolerate BiPAP, remains on 10 L of high flow supplemental oxygen.  Pulmonology was consulted medications adjusted.  Subsequently patient felt better and has improved.  He is weaned down to his baseline oxygen requirement.  Coreg was discontinued and switched to bisoprolol.  Patient is cleared from  pulmonology to be discharged.  Patient is being discharged home on prednisone taper for 10 to 12 days.  Patient has appointment with pulmonology in 2 weeks.  Patient is being discharged home.   Discharge Diagnoses:  Principal Problem:   Acute on chronic diastolic heart failure (HCC) Active Problems:   Type 2 diabetes mellitus without complication, without long-term current use of insulin (HCC)   Essential hypertension   Atrial fibrillation (HCC)   Sinoatrial node dysfunction (HCC)   COPD GOLD 0 spirometry/ AB    Acute on chronic respiratory failure with hypoxia (HCC)   CAD (coronary artery disease)   Rhinovirus  Acute on chronic hypoxic and hypercapnic respiratory failure: Multifactorial Acute decompensated diastolic CHF / Acute COPD exacerbation: Rhinovirus+ Patient presented with worsening shortness of breath, cough and chest congestion. Patient uses 4 L of supplemental oxygen at baseline. He was severely hypoxic with SPO2 of 81% on room air requiring BiPAP on arrival. Chest x-ray shows cardiomegaly and CHF pattern. BNP 270 / Fluid retention appears to be intra-abdominal space, there is no pedal edema. Continue IV Lasix 40 mg daily, changed to Lasix 40 mg p.o. daily. Continue supplemental oxygen and wean as tolerated, was requiring HFNC @10  L Continue Solu-Medrol 92 mg q12 hr, transitioned to prednisone taper. Monitor daily weight, intake output charting. Influenza negative, COVID-negative, Rhinovirus + 2D Echo 10/22 shows LVEF 55 to 60%. No RWMA Continue Xopenex in the setting of tachycardia. Continue doxycycline for COPD exacerbation. Pulmonology is consulted.  Medications adjusted.  Feeling much improved. He is weaned down to 5 L of supplemental oxygen.   CAD: Denies any chest pain.  Troponin negative.   Continue home medications(Coreg, Lipitor). Coreg changed with bisoprolol.   SA node dysfunction: S/p  PPM V paced rhythm in 70s on EKG Continue to follow   Atrial  fibrillation: Heart rate is well controlled.  S/p AV nodal ablation. Not on anticoagulation due to the history of bleed   Essential hypertension Blood pressure stable.   Type 2 diabetes without complication: Carb modified diet,  Regular insulin sliding scale    Discharge Instructions  Discharge Instructions     Call MD for:  difficulty breathing, headache or visual disturbances   Complete by: As directed    Call MD for:  persistant dizziness or light-headedness   Complete by: As directed    Call MD for:  persistant nausea and vomiting   Complete by: As directed    Diet - low sodium heart healthy   Complete by: As directed    Diet Carb Modified   Complete by: As directed    Discharge instructions   Complete by: As directed    Advised to follow-up with primary care physician in 1 week. Advised to follow-up with pulmonology Dr. Sandrea Hughs in 2 weeks. Advised to continue prednisone taper for 7 to 10 days is directed.   Increase activity slowly   Complete by: As directed       Allergies as of 07/23/2022       Reactions   Hydrocodone Other (See Comments)   "given to him in the hospital; went thru withdrawals once home; dr said not to take it again" (09/27/2012) dizziness   Tramadol Other (See Comments)   "given to him in the hospital; went thru withdrawals once home; dr said not to take it again" (09/27/2012) dizziness   Tadalafil Other (See Comments)   headache, backache, dizziness   Avelox [moxifloxacin Hydrochloride] Itching, Other (See Comments)   Headache, dizziness        Medication List     STOP taking these medications    carvedilol 25 MG tablet Commonly known as: COREG       TAKE these medications    acetaminophen 500 MG tablet Commonly known as: TYLENOL Take 1,000 mg by mouth as needed for headache or moderate pain.   albuterol 108 (90 Base) MCG/ACT inhaler Commonly known as: VENTOLIN HFA Inhale 2 puffs into the lungs every 6 (six) hours  as needed for wheezing or shortness of breath. What changed:  how much to take when to take this   amLODipine 10 MG tablet Commonly known as: NORVASC Take 1 tablet (10 mg total) by mouth daily.   aspirin 81 MG tablet Take 81 mg by mouth every morning.   atorvastatin 20 MG tablet Commonly known as: LIPITOR Take 1 tablet (20 mg total) by mouth at bedtime.   bisoprolol 10 MG tablet Commonly known as: ZEBETA Take 1 tablet (10 mg total) by mouth daily.   budesonide 0.5 MG/2ML nebulizer solution Commonly known as: PULMICORT Take 2 mLs (0.5 mg total) by nebulization 2 (two) times daily.   calcium-vitamin D 500-200 MG-UNIT tablet Commonly known as: OSCAL WITH D Take 1 tablet by mouth every morning.   cholecalciferol 25 MCG (1000 UNIT) tablet Commonly known as: VITAMIN D3 Take 1,000 Units by mouth at bedtime.   diphenhydramine-acetaminophen 25-500 MG Tabs tablet Commonly known as: TYLENOL PM Take 1 tablet by mouth at bedtime.   Farxiga 10 MG Tabs tablet Generic drug: dapagliflozin propanediol TAKE 1 TABLET DAILY BEFORE BREAKFAST What changed:  how much to take when to take this   furosemide 40 MG tablet Commonly known as: LASIX TAKE 2 TABLETS EVERY MORNING  AND 1 TABLET LATE IN THE AFTERNOON What changed:  how much to take how to take this when to take this additional instructions   guaiFENesin 600 MG 12 hr tablet Commonly known as: MUCINEX Take 2 tablets (1,200 mg total) by mouth 2 (two) times daily for 10 days.   ipratropium-albuterol 0.5-2.5 (3) MG/3ML Soln Commonly known as: DUONEB Take 3 mLs by nebulization 2 (two) times daily as needed. What changed: when to take this   loratadine 10 MG tablet Commonly known as: CLARITIN Take 1 tablet (10 mg total) by mouth daily.   multivitamins ther. w/minerals Tabs tablet Take 1 tablet by mouth daily.   nitroGLYCERIN 0.4 MG SL tablet Commonly known as: NITROSTAT Place 1 tablet (0.4 mg total) under the tongue every  5 (five) minutes x 3 doses as needed for chest pain.   onetouch ultrasoft lancets Check blood sugars no more than twice daily   OneTouch Verio test strip Generic drug: glucose blood USE TO CHECK BLOOD SUGAR NO MORE THAN TWICE A DAY   pantoprazole 40 MG tablet Commonly known as: PROTONIX Take 1 tablet (40 mg total) by mouth daily before breakfast.   predniSONE 10 MG tablet Commonly known as: DELTASONE Advised to take prednisone 40 mg daily for 3 days, then prednisone 30 mg daily for 3 days, then prednisone 20 mg daily for 3 days, then prednisone 10 mg daily for 3 days then discontinue.   tamsulosin 0.4 MG Caps capsule Commonly known as: FLOMAX Take 1 capsule (0.4 mg total) by mouth daily after supper.   zolpidem 10 MG tablet Commonly known as: AMBIEN Take 10 mg by mouth at bedtime.               Durable Medical Equipment  (From admission, onward)           Start     Ordered   07/23/22 1048  DME Oxygen  Once       Question Answer Comment  Length of Need Lifetime   Mode or (Route) Nasal cannula   Liters per Minute 4   Frequency Continuous (stationary and portable oxygen unit needed)   Oxygen conserving device Yes   Oxygen delivery system Gas      07/23/22 1048            Follow-up Information     Noemi Chapel, NP Follow up on 07/30/2022.   Specialty: Nurse Practitioner Why: Appt at 3PM.  Please arrive at 2:15 PM for check in process. Contact information: [address] [place] [address] [phone]         [name], MD Follow up in 1 week(s).   Specialty: Internal Medicine Contact information: [address] [place] [address] [phone]         Nyoka Cowden, MD Follow up in 1 week(s).   Specialty: Pulmonary Disease Contact information: [address] [place] [address] [phone]                Allergies  Allergen Reactions   Hydrocodone Other (See Comments)     "given to him in the hospital; went thru withdrawals once home; dr said not to take it again" (09/27/2012) dizziness   Tramadol Other (See Comments)    "given to him in the hospital; went thru withdrawals once home; dr said not to take it again" (09/27/2012) dizziness   Tadalafil Other (See Comments)    headache, backache,  dizziness   Avelox [Moxifloxacin Hydrochloride] Itching and Other (See Comments)    Headache, dizziness    Consultations: Pulmonology   Procedures/Studies: ECHOCARDIOGRAM COMPLETE  Result Date: 07/21/2022    ECHOCARDIOGRAM REPORT   Patient Name:   VALENTIN BENNEY Sr. Date of Exam: 07/21/2022 Medical Rec #:  [id number]         Height:       68.0 in Accession #:    [id number]        Weight:       179.1 lb Date of Birth:  [birth year]          BSA:          1.950 m Patient Age:    81 years          BP:           134/75 mmHg Patient Gender: M                 HR:           72 bpm. Exam Location:  Inpatient Procedure: 2D Echo Indications:    acute respiratory distress  History:        Patient has prior history of Echocardiogram examinations, most                 recent 07/30/2021. CHF, CAD, COPD, Arrythmias:Atrial                 Fibrillation; Risk Factors:Hypertension and Diabetes.  Sonographer:    Cathie Hoops Referring Phys: [id number] Jeanella Craze  Sonographer Comments: Suboptimal parasternal window and no subcostal window. Image acquisition challenging due to respiratory motion and Image acquisition challenging due to COPD. IMPRESSIONS  1. Left ventricular ejection fraction, by estimation, is 55 to 60%. The left ventricle has normal function. The left ventricle has no regional wall motion abnormalities. There is mild concentric left ventricular hypertrophy. Left ventricular diastolic parameters are indeterminate.  2. Peak RV-RA gradient 43 mmHg. Right ventricular systolic function is moderately reduced. The right ventricular size is mildly enlarged.  3. Left atrial size was mildly  dilated.  4. Right atrial size was mildly dilated.  5. The mitral valve is normal in structure. Trivial mitral valve regurgitation. No evidence of mitral stenosis.  6. The aortic valve is tricuspid. There is moderate calcification of the aortic valve. Aortic valve regurgitation is not visualized. Aortic valve sclerosis/calcification is present, without any evidence of aortic stenosis. Aortic valve mean gradient measures 6.0 mmHg.  7. The IVC was not visualized. FINDINGS  Left Ventricle: Left ventricular ejection fraction, by estimation, is 55 to 60%. The left ventricle has normal function. The left ventricle has no regional wall motion abnormalities. The left ventricular internal cavity size was normal in size. There is  mild concentric left ventricular hypertrophy. Left ventricular diastolic parameters are indeterminate. Right Ventricle: Peak RV-RA gradient 43 mmHg. The right ventricular size is mildly enlarged. No increase in right ventricular wall thickness. Right ventricular systolic function is moderately reduced. Left Atrium: Left atrial size was mildly dilated. Right Atrium: Right atrial size was mildly dilated. Pericardium: There is no evidence of pericardial effusion. Mitral Valve: The mitral valve is normal in structure. There is mild calcification of the mitral valve leaflet(s). Mild mitral annular calcification. Trivial mitral valve regurgitation. No evidence of mitral valve stenosis. Tricuspid Valve: The tricuspid valve is normal in structure. Tricuspid valve regurgitation is mild. Aortic Valve: The aortic valve is tricuspid. There is moderate calcification of the aortic valve.  Aortic valve regurgitation is not visualized. Aortic valve sclerosis/calcification is present, without any evidence of aortic stenosis. Aortic valve mean gradient measures 6.0 mmHg. Aortic valve peak gradient measures 11.3 mmHg. Aortic valve area, by VTI measures 1.94 cm. Pulmonic Valve: The pulmonic valve was normal in  structure. Pulmonic valve regurgitation is not visualized. Aorta: The aortic root is normal in size and structure. IAS/Shunts: No atrial level shunt detected by color flow Doppler.  LEFT VENTRICLE PLAX 2D LVIDd:         4.00 cm     Diastology LVIDs:         2.40 cm     LV e' medial:    7.18 cm/s LV PW:         1.10 cm     LV E/e' medial:  14.1 LV IVS:        1.10 cm     LV e' lateral:   9.14 cm/s LVOT diam:     2.00 cm     LV E/e' lateral: 11.1 LV SV:         61 LV SV Index:   31 LVOT Area:     3.14 cm  LV Volumes (MOD) LV vol d, MOD A2C: 86.7 ml LV vol d, MOD A4C: 76.8 ml LV vol s, MOD A2C: 38.7 ml LV vol s, MOD A4C: 32.6 ml LV SV MOD A2C:     48.0 ml LV SV MOD A4C:     76.8 ml LV SV MOD BP:      48.6 ml RIGHT VENTRICLE RV S prime:     10.10 cm/s TAPSE (M-mode): 1.5 cm LEFT ATRIUM             Index        RIGHT ATRIUM           Index LA diam:        3.80 cm 1.95 cm/m   RA Area:     23.50 cm LA Vol (A2C):   59.9 ml 30.72 ml/m  RA Volume:   68.50 ml  35.13 ml/m LA Vol (A4C):   55.9 ml 28.67 ml/m LA Biplane Vol: 58.2 ml 29.84 ml/m  AORTIC VALVE AV Area (Vmax):    2.08 cm AV Area (Vmean):   1.87 cm AV Area (VTI):     1.94 cm AV Vmax:           168.00 cm/s AV Vmean:          110.000 cm/s AV VTI:            0.316 m AV Peak Grad:      11.3 mmHg AV Mean Grad:      6.0 mmHg LVOT Vmax:         111.00 cm/s LVOT Vmean:        65.600 cm/s LVOT VTI:          0.195 m LVOT/AV VTI ratio: 0.62  AORTA Ao Root diam: 3.20 cm Ao Asc diam:  3.10 cm MITRAL VALVE                TRICUSPID VALVE MV Area (PHT): 4.21 cm     TR Peak grad:   43.6 mmHg MV Decel Time: 180 msec     TR Vmax:        330.00 cm/s MR Peak grad: 33.4 mmHg MR Vmax:      289.00 cm/s   SHUNTS MV E velocity: 101.00 cm/s  Systemic VTI:  0.20 m MV  A velocity: 28.30 cm/s   Systemic Diam: 2.00 cm MV E/A ratio:  3.57 Dalton McleanMD Electronically signed by Wilfred Lacy Signature Date/Time: 07/21/2022/3:32:33 PM    Final    VAS [place] LOWER EXTREMITY VENOUS  (DVT)  Result Date: 07/20/2022  Lower Venous DVT Study Patient Name:  FELTON BUCZYNSKI Sr.  Date of Exam:   07/20/2022 Medical Rec #: [id number]          Accession #:    [id number] Date of Birth: [birth year]           Patient Gender: M Patient Age:   74 years Exam Location[facility]  [facility] Procedure:      VAS [place] LOWER EXTREMITY VENOUS (DVT) Referring Phys: Canary Brim --------------------------------------------------------------------------------  Indications: Swelling.  Risk Factors: None identified. Comparison Study: No prior studies. Performing Technologist: Chanda Busing RVT  Examination Guidelines: A complete evaluation includes B-mode imaging, spectral Doppler, color Doppler, and power Doppler as needed of all accessible portions of each vessel. Bilateral testing is considered an integral part of a complete examination. Limited examinations for reoccurring indications may be performed as noted. The reflux portion of the exam is performed with the patient in reverse Trendelenburg.  +---------+---------------+---------+-----------+----------+--------------+ RIGHT    CompressibilityPhasicitySpontaneityPropertiesThrombus Aging +---------+---------------+---------+-----------+----------+--------------+ CFV      Full           Yes      Yes                                 +---------+---------------+---------+-----------+----------+--------------+ SFJ      Full                                                        +---------+---------------+---------+-----------+----------+--------------+ FV Prox  Full                                                        +---------+---------------+---------+-----------+----------+--------------+ FV Mid   Full                                                        +---------+---------------+---------+-----------+----------+--------------+ FV DistalFull                                                         +---------+---------------+---------+-----------+----------+--------------+ PFV      Full                                                        +---------+---------------+---------+-----------+----------+--------------+ POP      Full           Yes  Yes                                 +---------+---------------+---------+-----------+----------+--------------+ PTV      Full                                                        +---------+---------------+---------+-----------+----------+--------------+ PERO     Full                                                        +---------+---------------+---------+-----------+----------+--------------+   +---------+---------------+---------+-----------+----------+--------------+ LEFT     CompressibilityPhasicitySpontaneityPropertiesThrombus Aging +---------+---------------+---------+-----------+----------+--------------+ CFV      Full           Yes      Yes                                 +---------+---------------+---------+-----------+----------+--------------+ SFJ      Full                                                        +---------+---------------+---------+-----------+----------+--------------+ FV Prox  Full                                                        +---------+---------------+---------+-----------+----------+--------------+ FV Mid   Full                                                        +---------+---------------+---------+-----------+----------+--------------+ FV DistalFull                                                        +---------+---------------+---------+-----------+----------+--------------+ PFV      Full                                                        +---------+---------------+---------+-----------+----------+--------------+ POP      Full           Yes      Yes                                  +---------+---------------+---------+-----------+----------+--------------+ PTV      Full                                                        +---------+---------------+---------+-----------+----------+--------------+  PERO     Full                                                        +---------+---------------+---------+-----------+----------+--------------+     Summary: RIGHT: - There is no evidence of deep vein thrombosis in the lower extremity.  - No cystic structure found in the popliteal fossa.  LEFT: - There is no evidence of deep vein thrombosis in the lower extremity.  - No cystic structure found in the popliteal fossa.  *See table(s) above for measurements and observations. Electronically signed by Waverly Ferrari MD on 07/20/2022 at 6:18:54 PM.    Final    DG CHEST PORT 1 VIEW  Result Date: 07/19/2022 CLINICAL DATA:  Shortness of breath EXAM: PORTABLE CHEST 1 VIEW COMPARISON:  07/18/2022 FINDINGS: Left chest cardiac device with unchanged lead position. Unchanged cardiomegaly. Aortic atherosclerosis. Diffuse interstitial opacities, likely pulmonary edema. No definite pleural. No pneumothorax. No acute osseous abnormality. IMPRESSION: Cardiomegaly and pulmonary edema. Electronically Signed   By: Wiliam Ke M.D.   On: 07/19/2022 12:48   DG Chest Port 1 View  Result Date: 07/18/2022 CLINICAL DATA:  Cough and shortness of breath. Respiratory distress. EXAM: PORTABLE CHEST 1 VIEW COMPARISON:  Two-view chest x-ray 11/11/2021 FINDINGS: Heart is enlarged. Pacing wires are in place. Atherosclerotic calcifications are present in the aortic arch. Diffuse interstitial pattern is increased, suggesting edema superimposed on chronic interstitial changes. No focal airspace consolidation is present. No definite effusions are present. IMPRESSION: 1. Cardiomegaly and increased diffuse interstitial pattern compatible with congestive heart failure. 2. Aortic atherosclerosis. Electronically  Signed   By: Marin Roberts M.D.   On: 07/18/2022 17:45   CUP PACEART INCLINIC DEVICE CHECK  Result Date: 07/16/2022 CRT-P device check in clinic. Normal device function. Thresholds, sensing, impedance consistent with previous measurements. Histograms appropriate for patient and level of activity. Brief known ventricular high rate episodes. Patient bi-ventricularly pacing 98.8% of the time. Device programmed with appropriate safety margins. Device heart failure diagnostics are within normal limits and stable over time. Estimated longevity 3 months. Patient enrolled in monthly remote follow-up. No atrial lead.Ancil Boozer, BSN, RN  CUP PACEART REMOTE DEVICE CHECK  Result Date: 06/29/2022 Scheduled remote reviewed. Normal device function.  Estimated longevity 3 months 2 NSVT episodes, longest 6 beats Next remote 31 days.   Subjective: Patient was seen and examined at bedside.  Overnight events noted.  Patient reports feeling much improved. Patient remains on baseline oxygen requirement 4 to 5 L/min.  He feels better and  wants to be discharged.  Patient is being discharged home.  Discharge Exam: Vitals:   07/23/22 0800 07/23/22 1120  BP:  130/82  Pulse:  73  Resp: 13 14  Temp:  97.7 F (36.5 C)  SpO2:  97%   Vitals:   07/23/22 0443 07/23/22 0737 07/23/22 0800 07/23/22 1120  BP:    130/82  Pulse:    73  Resp:  (!) 24 13 14   Temp:    97.7 F (36.5 C)  TempSrc:      SpO2:  98%  97%  Weight: 78.6 kg     Height:        General: Pt is alert, awake, not in acute distress Cardiovascular: RRR, S1/S2 +, no rubs, no gallops Respiratory: CTA bilaterally, no wheezing, no rhonchi Abdominal:  Soft, NT, ND, bowel sounds + Extremities: no edema, no cyanosis    The results of significant diagnostics from this hospitalization (including imaging, microbiology, ancillary and laboratory) are listed below for reference.     Microbiology: Recent Results (from the past 240 hour(s))  Resp  Panel by RT-PCR (Flu A&B, Covid) Anterior Nasal Swab     Status: None   Collection Time: 07/18/22  5:21 PM   Specimen: Anterior Nasal Swab  Result Value Ref Range Status   SARS Coronavirus 2 by RT PCR NEGATIVE NEGATIVE Final    Comment: (NOTE) SARS-CoV-2 target nucleic acids are NOT DETECTED.  The SARS-CoV-2 RNA is generally detectable in upper respiratory specimens during the acute phase of infection. The lowest concentration of SARS-CoV-2 viral copies this assay can detect is 138 copies/mL. A negative result does not preclude SARS-Cov-2 infection and should not be used as the sole basis for treatment or other patient management decisions. A negative result may occur with  improper specimen collection/handling, submission of specimen other than nasopharyngeal swab, presence of viral mutation(s) within the areas targeted by this assay, and inadequate number of viral copies(<138 copies/mL). A negative result must be combined with clinical observations, patient history, and epidemiological information. The expected result is Negative.  Fact Sheet for Patients:  BloggerCourse.com  Fact Sheet for Healthcare Providers:  SeriousBroker.it  This test is no t yet approved or cleared by the [place] FDA and  has been authorized for detection and/or diagnosis of SARS-CoV-2 by FDA under an Emergency Use Authorization (EUA). This EUA will remain  in effect (meaning this test can be used) for the duration of the COVID-19 declaration under Section 564(b)(1) of the Act, 21 U.S.C.section 360bbb-3(b)(1), unless the authorization is terminated  or revoked sooner.       Influenza A by PCR NEGATIVE NEGATIVE Final   Influenza B by PCR NEGATIVE NEGATIVE Final    Comment: (NOTE) The Xpert Xpress SARS-CoV-2/FLU/RSV plus assay is intended as an aid in the diagnosis of influenza from Nasopharyngeal swab specimens and should not be used as a sole  basis for treatment. Nasal washings and aspirates are unacceptable for Xpert Xpress SARS-CoV-2/FLU/RSV testing.  Fact Sheet for Patients: BloggerCourse.com  Fact Sheet for Healthcare Providers: SeriousBroker.it  This test is not yet approved or cleared by the [place] FDA and has been authorized for detection and/or diagnosis of SARS-CoV-2 by FDA under an Emergency Use Authorization (EUA). This EUA will remain in effect (meaning this test can be used) for the duration of the COVID-19 declaration under Section 564(b)(1) of the Act, 21 U.S.C. section 360bbb-3(b)(1), unless the authorization is terminated or revoked.  Performed at [facility], [address] [place], [place] [address]   Respiratory (~20 pathogens) panel by PCR     Status: Abnormal   Collection Time: 07/19/22  6:21 AM   Specimen: Nasopharyngeal Swab; Respiratory  Result Value Ref Range Status   Adenovirus NOT DETECTED NOT DETECTED Final   Coronavirus 229E NOT DETECTED NOT DETECTED Final    Comment: (NOTE) The Coronavirus on the Respiratory Panel, DOES NOT test for the novel  Coronavirus (2019 nCoV)    Coronavirus HKU1 NOT DETECTED NOT DETECTED Final   Coronavirus NL63 NOT DETECTED NOT DETECTED Final   Coronavirus OC43 NOT DETECTED NOT DETECTED Final   Metapneumovirus NOT DETECTED NOT DETECTED Final   Rhinovirus / Enterovirus DETECTED (A) NOT DETECTED Final   Influenza A NOT DETECTED NOT DETECTED Final   Influenza B NOT  DETECTED NOT DETECTED Final   Parainfluenza Virus 1 NOT DETECTED NOT DETECTED Final   Parainfluenza Virus 2 NOT DETECTED NOT DETECTED Final   Parainfluenza Virus 3 NOT DETECTED NOT DETECTED Final   Parainfluenza Virus 4 NOT DETECTED NOT DETECTED Final   Respiratory Syncytial Virus NOT DETECTED NOT DETECTED Final   Bordetella pertussis NOT DETECTED NOT DETECTED Final   Bordetella Parapertussis NOT DETECTED NOT DETECTED Final    Chlamydophila pneumoniae NOT DETECTED NOT DETECTED Final   Mycoplasma pneumoniae NOT DETECTED NOT DETECTED Final    Comment: Performed at [facility], [place] [address] [place], [place] [address]     Labs: BNP (last 3 results) Recent Labs    09/01/21 0232 09/25/21 0828 07/18/22 1721  BNP 372.9* 193.5* 427.0*   Basic Metabolic Panel: Recent Labs  Lab 07/18/22 1721 07/19/22 0446 07/21/22 0444 07/23/22 0429  NA 140 141 139 141  K 4.2 4.2 4.4 4.2  CL 103 100 99 100  CO2 31 32 33* 33*  GLUCOSE 149* 173* 165* 258*  BUN 13 16 30* 23  CREATININE 0.73 0.78 0.76 0.72  CALCIUM 8.5* 8.7* 8.9 8.4*  MG  --   --  2.4  --   PHOS  --   --  3.8  --    Liver Function Tests: Recent Labs  Lab 07/19/22 0446  AST 19  ALT 14  ALKPHOS 68  BILITOT 0.9  PROT 7.3  ALBUMIN 3.4*   No results for input(s): "LIPASE", "AMYLASE" in the last 168 hours. No results for input(s): "AMMONIA" in the last 168 hours. CBC: Recent Labs  Lab 07/18/22 1721 07/19/22 0446 07/21/22 0444  WBC 9.7 9.9 6.9  NEUTROABS 6.6  --   --   HGB 15.1 15.1 14.2  HCT 45.7 45.5 44.2  MCV 92.0 92.9 94.2  PLT 190 187 193   Cardiac Enzymes: No results for input(s): "CKTOTAL", "CKMB", "CKMBINDEX", "TROPONINI" in the last 168 hours. BNP: Invalid input(s): "POCBNP" CBG: Recent Labs  Lab 07/22/22 1640 07/22/22 2039 07/22/22 2151 07/23/22 0714 07/23/22 1118  GLUCAP 179* 217* 192* 188* 142*   D-Dimer No results for input(s): "DDIMER" in the last 72 hours. Hgb A1c No results for input(s): "HGBA1C" in the last 72 hours. Lipid Profile No results for input(s): "CHOL", "HDL", "LDLCALC", "TRIG", "CHOLHDL", "LDLDIRECT" in the last 72 hours. Thyroid function studies No results for input(s): "TSH", "T4TOTAL", "T3FREE", "THYROIDAB" in the last 72 hours.  Invalid input(s): "FREET3" Anemia work up No results for input(s): "VITAMINB12", "FOLATE", "FERRITIN", "TIBC", "IRON", "RETICCTPCT" in the last 72  hours. Urinalysis    Component Value Date/Time   COLORURINE YELLOW 07/15/2015 1440   APPEARANCEUR CLEAR 07/15/2015 1440   LABSPEC 1.015 07/15/2015 1440   PHURINE 6.0 07/15/2015 1440   GLUCOSEU NEGATIVE 07/15/2015 1440   HGBUR TRACE-LYSED (A) 07/15/2015 1440   BILIRUBINUR NEGATIVE 07/15/2015 1440   KETONESUR TRACE (A) 07/15/2015 1440   PROTEINUR NEGATIVE 07/06/2012 0341   UROBILINOGEN 1.0 07/15/2015 1440   NITRITE NEGATIVE 07/15/2015 1440   LEUKOCYTESUR NEGATIVE 07/15/2015 1440   Sepsis Labs Recent Labs  Lab 07/18/22 1721 07/19/22 0446 07/21/22 0444  WBC 9.7 9.9 6.9   Microbiology Recent Results (from the past 240 hour(s))  Resp Panel by RT-PCR (Flu A&B, Covid) Anterior Nasal Swab     Status: None   Collection Time: 07/18/22  5:21 PM   Specimen: Anterior Nasal Swab  Result Value Ref Range Status   SARS Coronavirus 2 by RT PCR NEGATIVE NEGATIVE  Final    Comment: (NOTE) SARS-CoV-2 target nucleic acids are NOT DETECTED.  The SARS-CoV-2 RNA is generally detectable in upper respiratory specimens during the acute phase of infection. The lowest concentration of SARS-CoV-2 viral copies this assay can detect is 138 copies/mL. A negative result does not preclude SARS-Cov-2 infection and should not be used as the sole basis for treatment or other patient management decisions. A negative result may occur with  improper specimen collection/handling, submission of specimen other than nasopharyngeal swab, presence of viral mutation(s) within the areas targeted by this assay, and inadequate number of viral copies(<138 copies/mL). A negative result must be combined with clinical observations, patient history, and epidemiological information. The expected result is Negative.  Fact Sheet for Patients:  BloggerCourse.com  Fact Sheet for Healthcare Providers:  SeriousBroker.it  This test is no t yet approved or cleared by the [place] FDA and  has been authorized for detection and/or diagnosis of SARS-CoV-2 by FDA under an Emergency Use Authorization (EUA). This EUA will remain  in effect (meaning this test can be used) for the duration of the COVID-19 declaration under Section 564(b)(1) of the Act, 21 U.S.C.section 360bbb-3(b)(1), unless the authorization is terminated  or revoked sooner.       Influenza A by PCR NEGATIVE NEGATIVE Final   Influenza B by PCR NEGATIVE NEGATIVE Final    Comment: (NOTE) The Xpert Xpress SARS-CoV-2/FLU/RSV plus assay is intended as an aid in the diagnosis of influenza from Nasopharyngeal swab specimens and should not be used as a sole basis for treatment. Nasal washings and aspirates are unacceptable for Xpert Xpress SARS-CoV-2/FLU/RSV testing.  Fact Sheet for Patients: BloggerCourse.com  Fact Sheet for Healthcare Providers: SeriousBroker.it  This test is not yet approved or cleared by the [place] FDA and has been authorized for detection and/or diagnosis of SARS-CoV-2 by FDA under an Emergency Use Authorization (EUA). This EUA will remain in effect (meaning this test can be used) for the duration of the COVID-19 declaration under Section 564(b)(1) of the Act, 21 U.S.C. section 360bbb-3(b)(1), unless the authorization is terminated or revoked.  Performed at [facility], [address] [place], [place] [address]   Respiratory (~20 pathogens) panel by PCR     Status: Abnormal   Collection Time: 07/19/22  6:21 AM   Specimen: Nasopharyngeal Swab; Respiratory  Result Value Ref Range Status   Adenovirus NOT DETECTED NOT DETECTED Final   Coronavirus 229E NOT DETECTED NOT DETECTED Final    Comment: (NOTE) The Coronavirus on the Respiratory Panel, DOES NOT test for the novel  Coronavirus (2019 nCoV)    Coronavirus HKU1 NOT DETECTED NOT DETECTED Final   Coronavirus NL63 NOT DETECTED NOT DETECTED Final    Coronavirus OC43 NOT DETECTED NOT DETECTED Final   Metapneumovirus NOT DETECTED NOT DETECTED Final   Rhinovirus / Enterovirus DETECTED (A) NOT DETECTED Final   Influenza A NOT DETECTED NOT DETECTED Final   Influenza B NOT DETECTED NOT DETECTED Final   Parainfluenza Virus 1 NOT DETECTED NOT DETECTED Final   Parainfluenza Virus 2 NOT DETECTED NOT DETECTED Final   Parainfluenza Virus 3 NOT DETECTED NOT DETECTED Final   Parainfluenza Virus 4 NOT DETECTED NOT DETECTED Final   Respiratory Syncytial Virus NOT DETECTED NOT DETECTED Final   Bordetella pertussis NOT DETECTED NOT DETECTED Final   Bordetella Parapertussis NOT DETECTED NOT DETECTED Final   Chlamydophila pneumoniae NOT DETECTED NOT DETECTED Final   Mycoplasma pneumoniae NOT DETECTED NOT DETECTED Final  Comment: Performed at [facility], [address], [place], [place] [address]     Time coordinating discharge: Over 30 minutes  SIGNED:   Cipriano Bunker, MD  [facility] 07/23/2022, 2:35 PM Pager   If 7PM-7AM, please contact night-coverage

## 2022-07-26 ENCOUNTER — Telehealth: Payer: Self-pay

## 2022-07-26 ENCOUNTER — Encounter: Payer: Self-pay | Admitting: Pharmacist

## 2022-07-26 ENCOUNTER — Ambulatory Visit (INDEPENDENT_AMBULATORY_CARE_PROVIDER_SITE_OTHER): Payer: BLUE CROSS/BLUE SHIELD | Admitting: Pharmacist

## 2022-07-26 DIAGNOSIS — I5032 Chronic diastolic (congestive) heart failure: Secondary | ICD-10-CM

## 2022-07-26 DIAGNOSIS — I10 Essential (primary) hypertension: Secondary | ICD-10-CM

## 2022-07-26 DIAGNOSIS — J449 Chronic obstructive pulmonary disease, unspecified: Secondary | ICD-10-CM

## 2022-07-26 DIAGNOSIS — E119 Type 2 diabetes mellitus without complications: Secondary | ICD-10-CM

## 2022-07-26 NOTE — Progress Notes (Signed)
Pharmacy Note  07/26/2022 Name: Bradley WIDRIG Sr. MRN: [id number] DOB: Aug 24, 1941  Subjective: Bradley Searles Sr. is a 81 y.o. year old male who is a primary care patient of [name], MD. Clinical Pharmacist Practitioner referral was placed to assist with medication and diabetes management.    Engaged with patient by telephone for follow up visit today. 07/18/2022 to 07/23/2022 - Hospitalized at [facility] for acute on chronic CHF and chronic repspiratory failure / COPD exacerbation.Diuresed and given solu-medrol.  At discharged the following medication changes were noted:  New Medications: prednisone taper - 40mg  daily for 3 days, then 30mg  daily for 3 days, then 20mg  dail yfor 3 days, then 10mg  daily for 3 days, then stop;  Start Bisoprolol 10mg  daily; start guaifenesin 1200mg  twice a day for 10 days Medications Stopped: carvedilol.   Reviewed medication list and confirmed patient has made recommended changes made at hospital discharge. He will see PCP tomorrow for follow up.   CHF / HFpEF- Patient is weighing daily. Reports weight has not changed more than 1 pound since coming home from hospital. He is currently taking bisoprolol 10mg  daily, furosemide 40mg  - 2 tablets each morning and 1 tablet each afternoon; Farxiga 10mg  daily. Also taking amlodipine 10mg  daily for blood pressure. Dr had started [place] on 12/07/2021. It looks like he filled Entresto once on 12/07/2021 for 30 day supply and then thru [facility] for 90 days on 12/08/2021. So he had 4 months of medication. was removed form his med list at afib appointment with Dr on 07/16/2022. Blood pressure has been on lower end of normal recently. If is needed and restarted might need to lower dose of amlodipine to prevent hypotension. In past I assessed cost of Entresto and patient reported his secondary insurance covered Entresto with copay of about $90 per 90 days which is affordable for  patient. EF in hospital was 55 to 60% Type 2 DM - Patient has not checked blood glucose since returning home from hospital. Blood glucose was controlled during hospitalization. Patient is taking prednisone for 12 days. Reminded him that prednisone might increase blood glucose. Taking Farxiga 10mg  daily.   COPD - Patient endorses that he is taking prednisone according to directions. He is also using budesonide nebs twice a day and DueNeb as needed. He reports he is going to DME supply  today to see about getting a different portable O2 concentrator. Has follow up with pulmonology office 07/30/2022  Objective: Review of patient status, including review of consultants reports, laboratory and other test data, was performed as part of comprehensive evaluation and provision of chronic care management services.   Lab Results  Component Value Date   CREATININE 0.72 07/23/2022   CREATININE 0.76 07/21/2022   CREATININE 0.78 07/19/2022    Lab Results  Component Value Date   HGBA1C 6.2 (H) 07/19/2022       Component Value Date/Time   CHOL 141 11/11/2021 0928   TRIG 148.0 11/11/2021 0928   HDL 41.10 11/11/2021 0928   CHOLHDL 3 11/11/2021 0928   VLDL 29.6 11/11/2021 0928   LDLCALC 70 11/11/2021 0928     Clinical ASCVD: Yes  The ASCVD Risk score (Arnett DK, et al., 2019) failed to calculate for the following reasons:   The 2019 ASCVD risk score is only valid for ages 53 to 92    BP Readings from Last 3 Encounters:  07/23/22 130/82  07/16/22 118/74  07/13/22 126/76  Allergies  Allergen Reactions   Hydrocodone Other (See Comments)    "given to him in the hospital; went thru withdrawals once home; dr said not to take it again" (09/27/2012) dizziness   Tramadol Other (See Comments)    "given to him in the hospital; went thru withdrawals once home; dr said not to take it again" (09/27/2012) dizziness   Tadalafil Other (See Comments)    headache, backache, dizziness   Avelox  [Moxifloxacin Hydrochloride] Itching and Other (See Comments)    Headache, dizziness    Medications Reviewed Today     Reviewed by Rulon Sera, CPhT (Pharmacy Technician) on 07/19/22 at 1031  Med List Status: Complete   Medication Order Taking? Sig Documenting Provider Last Dose Status Informant  acetaminophen (TYLENOL) 500 MG tablet [id number] Yes Take 1,000 mg by mouth as needed for headache or moderate pain. [provider] 07/18/2022 Active Self  albuterol (VENTOLIN HFA) 108 (90 Base) MCG/ACT inhaler [id number] Yes Inhale 2 puffs into the lungs every 6 (six) hours as needed for wheezing or shortness of breath.  Patient taking differently: Inhale 1 puff into the lungs as needed for wheezing or shortness of breath.   Maretta Bees, MD months Active Self  amLODipine (NORVASC) 10 MG tablet [id number] Yes Take 1 tablet (10 mg total) by mouth daily. [name], MD 07/18/2022 Active Self  aspirin 81 MG tablet [id number] Yes Take 81 mg by mouth every morning.  [provider] 07/18/2022 Active Self  atorvastatin (LIPITOR) 20 MG tablet [id number] Yes Take 1 tablet (20 mg total) by mouth at bedtime. [name], MD 07/17/2022 Active Self  budesonide (PULMICORT) 0.5 MG/2ML nebulizer solution [id number] Yes Take 2 mLs (0.5 mg total) by nebulization 2 (two) times daily. [name], MD 07/18/2022 Active Self  calcium-vitamin D (OSCAL WITH D) 500-200 MG-UNIT tablet [id number] Yes Take 1 tablet by mouth every morning. [provider] 07/18/2022 Active Self  carvedilol (COREG) 25 MG tablet [id number] Yes TAKE 1 TABLET TWICE A DAY WITH MEALS  Patient taking differently: Take 25 mg by mouth 2 (two) times daily with a meal.   [name], MD 07/18/2022 morning Active Self  cholecalciferol (VITAMIN D3) 25 MCG (1000 UNIT) tablet [id number] Yes Take 1,000 Units by mouth at bedtime. [provider] 07/17/2022 Active Self  diphenhydramine-acetaminophen (TYLENOL PM) 25-500 MG TABS tablet  [id number] Yes Take 1 tablet by mouth at bedtime. [provider] 07/17/2022 Active Self           Med Note Nancy Nordmann Sep 07, 2021 12:08 PM)    FARXIGA 10 MG TABS tablet [id number] Yes TAKE 1 TABLET DAILY BEFORE BREAKFAST  Patient taking differently: Take 10 mg by mouth daily.   [name], MD 07/18/2022 Active Self  furosemide (LASIX) 40 MG tablet [id number] Yes TAKE 2 TABLETS EVERY MORNING AND 1 TABLET LATE IN THE AFTERNOON  Patient taking differently: Take 40 mg by mouth daily.   [name], MD 07/18/2022 Active Self  ipratropium-albuterol (DUONEB) 0.5-2.5 (3) MG/3ML SOLN [id number] Yes Take 3 mLs by nebulization 2 (two) times daily as needed.  Patient taking differently: Take 3 mLs by nebulization in the morning and at bedtime.   [name], MD 07/17/2022 Active Self  Lancets Letta Pate ULTRASOFT) lancets [id number]  Check blood sugars no more than twice daily [name], MD  Active Self  loratadine (CLARITIN) 10 MG tablet [id number] Yes Take 1 tablet (10 mg total) by mouth  daily. Geradine Girt, DO 07/18/2022 Active Self  Multiple Vitamins-Minerals (MULTIVITAMINS THER. W/MINERALS) TABS tablet [id number] Yes Take 1 tablet by mouth daily. Colon Branch, MD 07/18/2022 Active Self  nitroGLYCERIN (NITROSTAT) 0.4 MG SL tablet [id number] Yes Place 1 tablet (0.4 mg total) under the tongue every 5 (five) minutes x 3 doses as needed for chest pain. Colon Branch, MD unk Active Self           Med Note Jacinta Shoe, Danielle Rankin Jul 19, 2022  7:17 AM)    Glory Rosebush VERIO test strip [id number]  USE TO CHECK BLOOD SUGAR NO MORE THAN TWICE A DAY Colon Branch, MD  Active Self  pantoprazole (PROTONIX) 40 MG tablet [id number] Yes Take 1 tablet (40 mg total) by mouth daily before breakfast. Colon Branch, MD 07/18/2022 Active Self  tamsulosin (FLOMAX) 0.4 MG CAPS capsule [id number] Yes Take 1 capsule (0.4 mg total) by mouth daily after supper. Colon Branch, MD 07/18/2022 Active Self  zolpidem (AMBIEN) 10 MG  tablet [id number] Yes Take 10 mg by mouth at bedtime. [provider] 07/17/2022 Active Self            Patient Active Problem List   Diagnosis Date Noted   Rhinovirus    CAD (coronary artery disease) 07/19/2022   Acute on chronic diastolic heart failure ([place]) 07/18/2022   Acute on chronic respiratory failure with hypoxia ([place]) 07/18/2022   GERD without esophagitis 07/30/2021   Cor pulmonale (chronic) ([place]) 08/05/2020   COPD exacerbation ([place]) 12/22/2018   COPD GOLD 0 spirometry/ AB  12/22/2018   PCP NOTES >>> 07/15/2015   Annual physical exam 05/08/2012   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   Sinoatrial node dysfunction (HCC)    Non-ischemic cardiomyopathy ([place]) 09/06/2011   Chronic diastolic heart failure (HCC)    PAD (peripheral artery disease) ([place]) 10/22/2010   DERMATITIS, STASIS 09/14/2010   Chronic respiratory failure with hypoxia and hypercapnia ([place]) 09/14/2010   Chronic pulmonary hypertension secondary to elevated L H pressures  04/15/2009   ERECTILE DYSFUNCTION 01/06/2009   Mixed diabetic hyperlipidemia associated with type 2 diabetes mellitus ([place]) 12/13/2007   Essential hypertension 12/13/2007   Anxiety--insomnia 08/14/2007   Type 2 diabetes mellitus without complication, without long-term current use of insulin ([place]) 01/02/2007   Atrial fibrillation ([place]) 01/02/2007   Benign enlargement of prostate 01/02/2007     Medication Assistance:  None required.  Patient affirms current coverage meets needs.   Assessment / Plan: CHF / HFpEF- recent exacerbation Reminded to weigh daily - report weight gain of more than 3 lbs in 24 hours or 5 lbs in 1 week.  Discussed signs and symptoms of CHF exacerbation - weight gain, SOB, abdominal fullness, swelling in legs or abdomen, Fatigue and weakness, changes in ability to perform usual activities, persistent cough or wheezing with white or pink blood-tinged mucus, nausea and lack of appetite Continue bisoprolol 10mg  daily,  furosemide 40mg  - 2 tablets each morning and 1 tablet each afternoon; Farxiga 10mg  daily.  Will make sure Dr Larose Kells and Dr Oval Linsey are aware patient has stopped Entresto.  If Delene Loll is needed and restarted might need to lower dose of amlodipine to prevent hypotension.  Type 2 DM - Last A1  was at goal Recommended checking blood glucose since he is taking prednisone. Contact office if blood glucose > 200.  Continue Farxiga 10mg  daily.   COPD -  Continue prednisone according to directions and budesonide nebs twice a day. DueNeb as needed.  Follow up with pulmonology office 07/30/2022  Follow Up:  Telephone follow up appointment with care management team member scheduled for:  1 month - to review med list   Henrene Pastor, PharmD Clinical Pharmacist [facility]  - [facility] [phone]

## 2022-07-26 NOTE — Telephone Encounter (Signed)
Noted, thank you

## 2022-07-26 NOTE — Patient Instructions (Addendum)
  Mr. Sweetser It was a pleasure speaking with you today.  Below is a summary of your health goals and summary of our recent visit.     Remember to weigh daily. Report weight gain of more than 3 lbs in 24 hours or 5 lbs in 1 week to either Dr Ethel Rana office or Dr Oval Linsey. Also report is you have any worsening of the following symptoms of heart failure - weight gain, SOB, abdominal fullness, swelling in legs or abdomen, Fatigue and weakness, changes in ability to perform usual activities, persistent cough or wheezing with white or pink blood-tinged mucus, nausea and lack of appetite  Continue bisoprolol 10mg  daily, furosemide 40mg  - 2 tablets each morning and 1 tablet each afternoon; Farxiga 10mg  daily.   I will check with Dr Oval Linsey about Delene Loll and if they recommended to restart.   Type 2 DM - Last A1  was 6.5% at goal Recommended checking blood glucose since your are taking prednisone. Contact office if blood glucose > 200.  Continue Farxiga 10mg  daily.    Home blood glucose  goals  Fasting blood glucose goal (before meals) = 80 to 130 Blood glucose goal after a meal = less than 180      Follow Up:  Telephone follow up appointment with Clinical Pharmacist Practitioner scheduled for:  1 month - to review med list. (Phone Visit 08/26/2022 at 10:15am)   As always if you have any questions or concerns especially regarding medications, please feel free to contact me either at the phone number below or with a MyChart message.    Cherre Robins, PharmD Clinical Pharmacist [facility] [phone] (direct line)  [phone] (main office number)

## 2022-07-26 NOTE — Telephone Encounter (Signed)
Transition Care Management Follow-up Telephone Call Date of discharge and from where: [name] 07/23/2022 How have you been since you were released from the hospital? weak Any questions or concerns? No  Items Reviewed: Did the pt receive and understand the discharge instructions provided? Yes  Medications obtained and verified? No  Other? No  Any new allergies since your discharge? No  Dietary orders reviewed? Yes Do you have support at home? Yes   Home Care and Equipment/Supplies: Were home health services ordered? no If so, what is the name of the agency? N/a  Has the agency set up a time to come to the patient's home? not applicable Were any new equipment or medical supplies ordered?  No What is the name of the medical supply agency? N/a Were you able to get the supplies/equipment? not applicable Do you have any questions related to the use of the equipment or supplies? No  Functional Questionnaire: (I = Independent and D = Dependent) ADLs: I  Bathing/Dressing- i  Meal Prep- i  Eating- i  Maintaining continence- i  Transferring/Ambulation- i  Managing Meds- i  Follow up appointments reviewed:  PCP Hospital f/u appt confirmed? Yes  Scheduled to see Dr Larose Kells on 07/27/2022 @ 3:40. [place] [facility] f/u appt confirmed? No  Patient to schedule appt with Dr Melvyn Novas in 2 weeks Are transportation arrangements needed? No  If their condition worsens, is the pt aware to call PCP or go to the Emergency Dept.? Yes Was the patient provided with contact information for the PCP's office or ED? Yes Was to pt encouraged to call back with questions or concerns? Yes

## 2022-07-27 ENCOUNTER — Ambulatory Visit (INDEPENDENT_AMBULATORY_CARE_PROVIDER_SITE_OTHER): Payer: Medicare Other | Admitting: Internal Medicine

## 2022-07-27 ENCOUNTER — Encounter: Payer: Self-pay | Admitting: Internal Medicine

## 2022-07-27 VITALS — BP 118/80 | HR 101 | Temp 98.1°F | Resp 16 | Ht 69.0 in | Wt 184.0 lb

## 2022-07-27 DIAGNOSIS — I5032 Chronic diastolic (congestive) heart failure: Secondary | ICD-10-CM | POA: Diagnosis not present

## 2022-07-27 DIAGNOSIS — J441 Chronic obstructive pulmonary disease with (acute) exacerbation: Secondary | ICD-10-CM | POA: Diagnosis not present

## 2022-07-27 DIAGNOSIS — J9612 Chronic respiratory failure with hypercapnia: Secondary | ICD-10-CM

## 2022-07-27 DIAGNOSIS — J9611 Chronic respiratory failure with hypoxia: Secondary | ICD-10-CM

## 2022-07-27 DIAGNOSIS — E1159 Type 2 diabetes mellitus with other circulatory complications: Secondary | ICD-10-CM

## 2022-07-27 MED ORDER — IPRATROPIUM-ALBUTEROL 0.5-2.5 (3) MG/3ML IN SOLN: 3.0000 mL | Freq: Four times a day (QID) | RESPIRATORY_TRACT | Status: DC | PRN

## 2022-07-27 NOTE — Progress Notes (Unsigned)
Subjective:    Patient ID: Bradley Sit., male    DOB: Mar 19, 1941, 81 y.o.   MRN: [id number]  DOS:  07/27/2022 Type of visit - description: Hospital follow-up, TCM 7 Admitted to the hospital for 5 days, discharged 07/23/2022 Presented with shortness of breath, x4 to 5 days. He was hypoxic, O2 sat upon admission 81%. Rx of BiPAP, chest x-ray findings showed cardiomegaly and possibly CHF. Symptoms felt to be due to CHF and COPD exacerbation. Got IV diuresis, Solu-Medrol, bronchodilators, doxycycline. Was intolerant to BiPAP, so was treated with high flow oxygen.  Coreg was switched to bisoprolol. Sent home with prednisone taper   Review of Systems He is here with his wife Since he left the hospital, he feels only a little better.  Still short of breath, still coughing greenish sputum. No hemoptysis No lower extremity edema Using oxygen but does not check O2 sats Good compliance with bisoprolol.   Past Medical History:  Diagnosis Date   Abnormal CT scan, chest 2012   CT chest, several lymphadenopathies. Sees pulmonary   Anemia    intermittent   Arthritis    "?back" (09/19/2018)   Atrial fibrillation (HCC)    s/p AV node ablation & BiV PPM implantation 09/08/11 (op dictation pending)   BPH (benign prostatic hyperplasia)    Saw Dr [name] 2004, normal renal u/s   Bronchitis 08/26/2017   CAD (coronary artery disease)    CHF (congestive heart failure) (HCC)    Thought primarily to be non-systolic although EF down (EF 82-50% 12/2010, down to 35-40% 09/05/11), cath 2008 with no CAD, nuclear study 07/2011 showing Small area of reversibility in the distal ant/lat wall the left ventricle suspicious for ischemia/septal wall HK but felt to be low risk  (per D/C Summary 07/2011)   Chronic bronchitis (HCC)    "get it ~ q yr"   Chronic diastolic heart failure (HCC)         Chronic respiratory failure with hypoxia and hypercapnia (HCC) 09/14/2010   Followed in Pulmonary clinic/  [facility]/ [place]  Started on 02 2lpm at discharge 09/23/11   - PFT's 10/28/2011  FEV1  1.40 (51%)  with ratio 70 and no better p B2 and DLCO 53 corrects to 101    - PFT's 10/18/2014    FEV1 1.71 (59%) with ratio 68 and no sign change p B2 and DLCO 53 corrects to 85  -  HC03   07/28/20  = 33  -  HCO3   03/17/21     = 31     Heart murmur    HLD (hyperlipidemia)    HTN (hypertension)    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   Pleural effusion 2008   S/p decortication   Pneumonia 08/02/2011   Pulmonary HTN (HCC)    per cath 2008   Type II diabetes mellitus (HCC) 1999    Past Surgical History:  Procedure Laterality Date   BI-VENTRICULAR PACEMAKER INSERTION Left 09/08/2011   Procedure: BI-VENTRICULAR PACEMAKER INSERTION (CRT-P);  Surgeon: Marinus Maw, MD;  Location: [facility] CATH LAB;  Service: Cardiovascular;  Laterality: Left;   CARDIAC CATHETERIZATION     "couple times; never had balloon or stent" (09/19/2018)   CATARACT EXTRACTION W/ INTRAOCULAR  LENS  IMPLANT, BILATERAL Bilateral 08/2018   COLONOSCOPY  03/10/11   normal   INSERT / REPLACE / REMOVE PACEMAKER  09/08/11   pacemaker placement   LUNG DECORTICATION     PLEURAL SCARIFICATION     pneumothorax with fibrothorax  ~ 2010   TONSILLECTOMY     "as a kid"     Current Outpatient Medications  Medication Instructions   acetaminophen (TYLENOL) 1,000 mg, Oral, As needed   albuterol (VENTOLIN HFA) 108 (90 Base) MCG/ACT inhaler 2 puffs, Inhalation, Every 6 hours PRN   amLODipine (NORVASC) 10 mg, Oral, Daily   aspirin 81 mg, Oral, Every morning   atorvastatin (LIPITOR) 20 mg, Oral, Daily at bedtime   bisoprolol (ZEBETA) 10 mg, Oral, Daily   budesonide (PULMICORT) 0.5 mg, Nebulization, 2 times daily   calcium-vitamin D (OSCAL WITH D) 500-200 MG-UNIT  tablet 1 tablet, Oral, Every morning   cholecalciferol (VITAMIN D3) 1,000 Units, Oral, Daily at bedtime   FARXIGA 10 MG TABS tablet TAKE 1 TABLET DAILY BEFORE BREAKFAST   furosemide (LASIX) 40 MG tablet TAKE 2 TABLETS EVERY MORNING AND 1 TABLET LATE IN THE AFTERNOON   guaiFENesin (MUCINEX) 1,200 mg, Oral, 2 times daily   ipratropium-albuterol (DUONEB) 0.5-2.5 (3) MG/3ML SOLN 3 mLs, Nebulization, 2 times daily PRN   Lancets (ONETOUCH ULTRASOFT) lancets Check blood sugars no more than twice daily   Multiple Vitamins-Minerals (MULTIVITAMINS THER. W/MINERALS) TABS tablet 1 tablet, Oral, Daily   nitroGLYCERIN (NITROSTAT) 0.4 mg, Sublingual, Every 5 min x3 PRN   ONETOUCH VERIO test strip USE TO CHECK BLOOD SUGAR NO MORE THAN TWICE A DAY   pantoprazole (PROTONIX) 40 mg, Oral, Daily before breakfast   predniSONE (DELTASONE) 10 MG tablet Advised to take prednisone 40 mg daily for 3 days, then prednisone 30 mg daily for 3 days, then prednisone 20 mg daily for 3 days, then prednisone 10 mg daily for 3 days then discontinue.   tamsulosin (FLOMAX) 0.4 mg, Oral, Daily after supper   zolpidem (AMBIEN) 10 mg, Oral, Daily at bedtime       Objective:   Physical Exam BP 118/80 (BP Location: Left Arm, Cuff Size: Large)   Pulse (!) 101   Temp 98.1 F (36.7 C) (Oral)   Resp 16   Ht 5\' 9"  (1.753 m)   Wt 184 lb (83.5 kg)   SpO2 (!) 89%   BMI 27.17 kg/m  General:   Well developed, NAD, BMI noted. HEENT:  Normocephalic . Face symmetric, atraumatic Lungs:  Creased breath sounds, increased expiratory time. Normal respiratory effort, no intercostal retractions, no accessory muscle use. Heart: Tachycardic. lower extremities: no pretibial edema bilaterally  Skin: Not pale. Not jaundice Neurologic:  alert & oriented X3.  Speech normal, gait appropriate for age and unassisted Psych--  Cognition and judgment appear intact.  Cooperative with normal attention span and concentration.  Behavior  appropriate. No anxious or depressed appearing.      Assessment   Assessment  DM HTN Hyperlipidemia GERD Anxiety- insomnia  BPH Cardiovascular:Dr Lovena Le  --  Cath x 2 [id number]: no significant CAD -- CHF, non-ischemic  --Atrial fibrillation; h/o AV node ablation, has a Pacemaker (biventricular pacing) --h/o IC bleeding: Not anticoagulated --Peripheral vascular disease Pulmonary: Dr Melvyn Novas, last OV 10-18-2014, f/u prn --COPD, severe, chronic resp failure, night O2 prn, has portable O2  --Pleural effusion s/p decortication --Pulmonary hypertension  Stasis dermatitis   PLAN: Acute on chronic hypoxic respiratory failure with me. Admitted with above Dx, felt to be CHF  and COPD exacerbation. Currently on prednisone, was on antibiotics during the hospital stay, was not sent home with p.o. antibiotics. He is not fully recuperated. O2 sat approximately 89% on 3 L 91% on 4 L. For now we will continue with Pulmicort, DuoNeb every 6 hours, prednisone.  He may need a second round of prednisone but he will see pulmonary in 2 days and I will leave that decision to them. Increase oxygen to 4 L BMP and CBC at the request of hospital team. DM: Last A1c 6.2, recent CBGs elevated in the context of getting prednisone and steroids HTN: Carvedilol switch to  Bisoprolol, also on amlodipine, Lasix.  Seems well controlled.    ==== DM: Last A1c 6.1, was recommended to continue [place].  Recheck A1c.  Feet exam performed, no neuropathy, nails are very long, declined podiatry referral. HTN: BP today is very good,  rec to check BPs at home.  Continue amlodipine, carvedilol, Lasix, Entresto.  Check BMP. Hyperlipidemia: Well-controlled on atorvastatin Right shoulder pain: As described above, recommend heat and Tylenol. Med compliance: Patient has medication list in his phone that does not coincide with the chart.  Will refer him to our clinical pharmacist.  In the meantime recommend to stick with the  medication list provided today. COPD: O2 sat in the low side today, he feels well.  At home is typically 90 to 95% Preventive care: Flu shot today, to have Shingrix soon.  Recommend COVID booster. RTC 4 months

## 2022-07-27 NOTE — Patient Instructions (Addendum)
Blood work today  Increase your oxygen to 4 L  Check your oxygen saturation, it should be above 90% most of the time  Please follow the list of medicines provided today.  Be sure you finish the prednisone  Be sure you take the nebulizations including:  - Pulmicort twice daily - DuoNeb nebulization every 6 hours as needed.  - The albuterol inhaler is only as needed if you do not have access to your nebulizers.

## 2022-07-28 LAB — CBC WITH DIFFERENTIAL/PLATELET
Basophils Absolute: 0.1 10*3/uL (ref 0.0–0.1)
Basophils Relative: 0.8 % (ref 0.0–3.0)
Eosinophils Absolute: 0 10*3/uL (ref 0.0–0.7)
Eosinophils Relative: 0.1 % (ref 0.0–5.0)
HCT: 45.4 % (ref 39.0–52.0)
Hemoglobin: 14.9 g/dL (ref 13.0–17.0)
Lymphocytes Relative: 11.3 % — ABNORMAL LOW (ref 12.0–46.0)
Lymphs Abs: 1.5 10*3/uL (ref 0.7–4.0)
MCHC: 32.8 g/dL (ref 30.0–36.0)
MCV: 92.2 fl (ref 78.0–100.0)
Monocytes Absolute: 0.9 10*3/uL (ref 0.1–1.0)
Monocytes Relative: 6.7 % (ref 3.0–12.0)
Neutro Abs: 10.9 10*3/uL — ABNORMAL HIGH (ref 1.4–7.7)
Neutrophils Relative %: 81.1 % — ABNORMAL HIGH (ref 43.0–77.0)
Platelets: 237 10*3/uL (ref 150.0–400.0)
RBC: 4.93 Mil/uL (ref 4.22–5.81)
RDW: 14.6 % (ref 11.5–15.5)
WBC: 13.4 10*3/uL — ABNORMAL HIGH (ref 4.0–10.5)

## 2022-07-28 LAB — COMPREHENSIVE METABOLIC PANEL
ALT: 18 U/L (ref 0–53)
AST: 14 U/L (ref 0–37)
Albumin: 3.5 g/dL (ref 3.5–5.2)
Alkaline Phosphatase: 58 U/L (ref 39–117)
BUN: 16 mg/dL (ref 6–23)
CO2: 34 mEq/L — ABNORMAL HIGH (ref 19–32)
Calcium: 8.9 mg/dL (ref 8.4–10.5)
Chloride: 95 mEq/L — ABNORMAL LOW (ref 96–112)
Creatinine, Ser: 0.76 mg/dL (ref 0.40–1.50)
GFR: 84.32 mL/min (ref >60.00)
Glucose, Bld: 178 mg/dL — ABNORMAL HIGH (ref 70–99)
Potassium: 5.1 mEq/L (ref 3.5–5.1)
Sodium: 138 mEq/L (ref 135–145)
Total Bilirubin: 1 mg/dL (ref 0.2–1.2)
Total Protein: 6.2 g/dL (ref 6.0–8.3)

## 2022-07-28 NOTE — Assessment & Plan Note (Signed)
Acute on chronic hypoxic respiratory failure . Admitted with above Dx, felt to be CHF and COPD exacerbation. Currently on prednisone, was on antibiotics during the hospital stay, was not sent home with p.o. antibiotics. He is not fully recuperated. O2 sat approximately 89% on 3 L , o sat went up to 91% on 4 L. For now we will continue with Pulmicort, DuoNeb every 6 hours, prednisone. Extensive discussion about nebulization and inhalers, he seemed  unsure of what exactly he is using.  See AVS. He may need a second round of prednisone but he will see pulmonary in 2 days and I will leave that decision to them. Increase oxygen to 4 L BMP and CBC at the request of hospital team. DM: Last A1c 6.2, recent CBGs elevated in the context of getting prednisone and steroids HTN: Carvedilol switch to  Bisoprolol, also on amlodipine, Lasix.  Seems well controlled.

## 2022-07-29 ENCOUNTER — Ambulatory Visit (INDEPENDENT_AMBULATORY_CARE_PROVIDER_SITE_OTHER): Payer: BLUE CROSS/BLUE SHIELD

## 2022-07-29 DIAGNOSIS — I428 Other cardiomyopathies: Secondary | ICD-10-CM

## 2022-07-29 LAB — CUP PACEART REMOTE DEVICE CHECK
Battery Remaining Longevity: 3 mo
Battery Voltage: 2.78 V
Brady Statistic AP VP Percent: 0 %
Brady Statistic AP VS Percent: 0 %
Brady Statistic AS VP Percent: 97.33 %
Brady Statistic AS VS Percent: 2.67 %
Brady Statistic RA Percent Paced: 0 %
Brady Statistic RV Percent Paced: 98.54 %
Date Time Interrogation Session: 20231019040634
Implantable Lead Implant Date: 20121128
Implantable Lead Implant Date: 20121128
Implantable Lead Location: 753858
Implantable Lead Location: 753860
Implantable Lead Model: 4194
Implantable Lead Model: 5076
Implantable Pulse Generator Implant Date: 20121128
Lead Channel Impedance Value: 342 Ohm
Lead Channel Impedance Value: 4047 Ohm
Lead Channel Impedance Value: 4047 Ohm
Lead Channel Impedance Value: 456 Ohm
Lead Channel Impedance Value: 494 Ohm
Lead Channel Impedance Value: 494 Ohm
Lead Channel Impedance Value: 532 Ohm
Lead Channel Impedance Value: 589 Ohm
Lead Channel Impedance Value: 684 Ohm
Lead Channel Pacing Threshold Amplitude: 0.5 V
Lead Channel Pacing Threshold Amplitude: 1.25 V
Lead Channel Pacing Threshold Pulse Width: 0.4 ms
Lead Channel Pacing Threshold Pulse Width: 0.4 ms
Lead Channel Sensing Intrinsic Amplitude: 11.625 mV
Lead Channel Sensing Intrinsic Amplitude: 11.625 mV
Lead Channel Setting Pacing Amplitude: 2 V
Lead Channel Setting Pacing Amplitude: 2.25 V
Lead Channel Setting Pacing Pulse Width: 0.4 ms
Lead Channel Setting Pacing Pulse Width: 0.4 ms
Lead Channel Setting Sensing Sensitivity: 4 mV

## 2022-07-30 ENCOUNTER — Encounter: Payer: Self-pay | Admitting: Nurse Practitioner

## 2022-07-30 ENCOUNTER — Ambulatory Visit (INDEPENDENT_AMBULATORY_CARE_PROVIDER_SITE_OTHER): Payer: Medicare Other

## 2022-07-30 ENCOUNTER — Ambulatory Visit (INDEPENDENT_AMBULATORY_CARE_PROVIDER_SITE_OTHER): Payer: Medicare Other | Admitting: Nurse Practitioner

## 2022-07-30 VITALS — BP 142/70 | HR 94 | Ht 69.0 in | Wt 183.8 lb

## 2022-07-30 DIAGNOSIS — I5033 Acute on chronic diastolic (congestive) heart failure: Secondary | ICD-10-CM | POA: Diagnosis not present

## 2022-07-30 DIAGNOSIS — J849 Interstitial pulmonary disease, unspecified: Secondary | ICD-10-CM

## 2022-07-30 DIAGNOSIS — I2781 Cor pulmonale (chronic): Secondary | ICD-10-CM

## 2022-07-30 DIAGNOSIS — J9611 Chronic respiratory failure with hypoxia: Secondary | ICD-10-CM

## 2022-07-30 DIAGNOSIS — J441 Chronic obstructive pulmonary disease with (acute) exacerbation: Secondary | ICD-10-CM

## 2022-07-30 DIAGNOSIS — J9612 Chronic respiratory failure with hypercapnia: Secondary | ICD-10-CM

## 2022-07-30 MED ORDER — PREDNISONE 10 MG PO TABS: ORAL_TABLET | ORAL | 0 refills | Status: DC

## 2022-07-30 MED ORDER — POTASSIUM CHLORIDE CRYS ER 20 MEQ PO TBCR: 20.0000 meq | EXTENDED_RELEASE_TABLET | Freq: Every day | ORAL | 0 refills | Status: DC

## 2022-07-30 MED ORDER — FUROSEMIDE 40 MG PO TABS: 40.0000 mg | ORAL_TABLET | Freq: Two times a day (BID) | ORAL | 0 refills | Status: DC

## 2022-07-30 NOTE — Patient Instructions (Addendum)
Continue ipratropium-albuterol 3 mL neb every 6 hours Continue budesonide 2 mL neb twice daily.  Brush tongue and rinse mouth afterwards Continue supplemental oxygen 4 L/min for goal greater than 88 to 90% Continue Mucinex 600 to 1200 mg twice daily Continue pantoprazole 1 tab daily  Prednisone taper. 4 tabs for 3 days, then 3 tabs for 3 days, 2 tabs for 3 days, then 1 tab for 3 days, then stop.  Take in a.m. with food Increase Lasix to 40 mg twice daily for the next 5 days.  After that resume 40 mg daily.  Potassium 20 meq daily while on increased dose of lasix   Follow up in one week with Dr. Melvyn Novas or Alanson Aly. If symptoms do not improve or worsen, please contact office for sooner follow up or seek emergency care.

## 2022-07-30 NOTE — Assessment & Plan Note (Signed)
Groundglass opacities on CT imaging. Question underlying ILD. He is still having acute symptoms so we will plan for HRCT in 2 months. He will need repeat PFTs to assess for underlying restriction/diffusion defect.

## 2022-07-30 NOTE — Assessment & Plan Note (Signed)
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 See above plan.

## 2022-07-30 NOTE — Assessment & Plan Note (Addendum)
Stable on 4 lpm POC today with O2 sats 94%. Baseline is 2-3 lpm. Advised to monitor for goal >88-90%. ED precautions should he develop worsening hypoxia with increased O2 requirements

## 2022-07-30 NOTE — Assessment & Plan Note (Signed)
CXR still with some increased interstitial edema but improved when compared to previous. BMET stable from 10/17. I will have him increase his lasix to Twice daily dosing for the next 5 days and add on potassium supplement while on this. Recheck BMET upon return next week. He's on farxiga, bisoprolol, and amlodipine. Follow up with cardiology as scheduled.

## 2022-07-30 NOTE — Progress Notes (Signed)
  ID: Bradley Searles Sr., male    DOB: 05-Jun-1941, 81 y.o.   MRN: [id number]  Chief Complaint  Patient presents with   Follow-up    Hospital f/u for SOB, wheezing. Wheezing has gotten better. Currently on 4L of 02    Referring provider: [name], MD  HPI: 81 year old male, former smoker followed for COPD, cor pulmonale, and chronic respiratory failure on supplemental O2. He is a patient of Dr. Thurston [name] and last seen in office on 03/19/2021. Past medical history significant for HTN, CHF, non-ischemic cardiomyopathy, sinoatrial node dysfunction s/p pacemaker, CAD, GERD, DM, HLD, anxiety.   He was recently admitted from 07/18/2022 to 07/23/2022 for acute on chronic respiratory failure related to rhinovirus, AECOPD, and acute CHF exacerbation. He had significant O2 requirements; initially requiring BiPAP and transitioned to 10 lpm HFNC. He was treated with IV steroids, diuretics, and empiric doxycycline. Concerned for possible underlying ILD with groundglass opacities on imaging; advised outpatient follow up. Discharged on triple therapy nebs with duonebs q 6h and budesonide Twice daily.   TEST/EVENTS:  10/28/2011 PFT: FEV1 51%, ratio 70. No BD. DLCO 53, corrects to 101 10/18/2014 PFT: FEV1 59%, ratio 68, no BD. DLCO corrects to 85  03/19/2021: OV with Dr. Sherene [name]. Recent admission from 03/05/2021-03/11/2021 for acute on chronic respiratory failure r/t CAP, AECOPD and decompensated HF. Discharged on baseline 2-3 lpm supplemental O2.   07/30/2022: Today - follow up Patient presents today for hospital follow up. He was seen by his PCP, Bradley [name], on 10/17 and noted to be 89% on 3 lpm POC; he was advised to increase to 4 lpm until he was seen here. He was also noted to still have some wheezing on exam; decided to hold off on further prednisone. Today, he tells me that he is doing better since he was discharged home. He still feels more short winded from his baseline. He's not quite back to his normal  activities and hasn't been doing as much around the house. His cough is better; occasionally productive with clear to rarely green sputum. He has been wheezing still. Denies fevers, chills, leg swelling, hemoptysis, orthopnea, PND, night sweats, weight gain. Completes budesonide nebs twice a day. Only doing duonebs twice daily. He takes lasix 40 mg once a day. Not consistently monitoring his oxygen levels.  Allergies  Allergen Reactions   Hydrocodone Other (See Comments)    "given to him in the hospital; went thru withdrawals once home; dr said not to take it again" (09/27/2012) dizziness   Tramadol Other (See Comments)    "given to him in the hospital; went thru withdrawals once home; dr said not to take it again" (09/27/2012) dizziness   Tadalafil Other (See Comments)    headache, backache, dizziness   Avelox [Moxifloxacin Hydrochloride] Itching and Other (See Comments)    Headache, dizziness    Immunization History  Administered Date(s) Administered   Fluad Quad(high Dose 65+) 06/26/2019, 08/11/2020, 07/13/2022   Influenza Split 10/01/2011   Influenza Whole 08/14/2007, 07/11/2009, 09/14/2010   Influenza, High Dose Seasonal PF 09/14/2013, 07/15/2015, 07/09/2016, 07/05/2017, 08/03/2018   Influenza,inj,Quad PF,6+ Mos 07/05/2014   Influenza-Unspecified 08/11/2012, 08/11/2021   Janssen (J&J) SARS-COV-2 Vaccination 01/12/2020, 09/19/2020   PFIZER Comirnaty(Gray Top)Covid-19 Tri-Sucrose Vaccine 03/18/2021   Pfizer Covid-19 Vaccine Bivalent Booster 43yrs & up 08/11/2021   Pneumococcal Conjugate-13 03/12/2015   Pneumococcal Polysaccharide-23 07/11/2004, 03/30/2009, 11/01/2017, 05/11/2019   Td 03/12/2003   Tetanus 09/14/2013   Zoster Recombinat (Shingrix) 03/11/2022   Zoster,  Live 04/20/2011    Past Medical History:  Diagnosis Date   Abnormal CT scan, chest 2012   CT chest, several lymphadenopathies. Sees pulmonary   Anemia    intermittent   Arthritis    "?back" (09/19/2018)    Atrial fibrillation (HCC)    s/p AV node ablation & BiV PPM implantation 09/08/11 (op dictation pending)   BPH (benign prostatic hyperplasia)    Saw Dr Bradley [name] 2004, normal renal u/s   Bronchitis 08/26/2017   CAD (coronary artery disease)    CHF (congestive heart failure) (HCC)    Thought primarily to be non-systolic although EF down (EF 38-25% 12/2010, down to 35-40% 09/05/11), cath 2008 with no CAD, nuclear study 07/2011 showing Small area of reversibility in the distal ant/lat wall the left ventricle suspicious for ischemia/septal wall HK but felt to be low risk  (per D/C Summary 07/2011)   Chronic bronchitis (HCC)    "get it ~ q yr"   Chronic diastolic heart failure (HCC)         Chronic respiratory failure with hypoxia and hypercapnia (HCC) 09/14/2010   Followed in Pulmonary clinic/  [facility]  Started on 02 2lpm at discharge 09/23/11   - PFT's 10/28/2011  FEV1  1.40 (51%)  with ratio 70 and no better p B2 and DLCO 53 corrects to 101    - PFT's 10/18/2014    FEV1 1.71 (59%) with ratio 68 and no sign change p B2 and DLCO 53 corrects to 85  -  HC03   07/28/20  = 33  -  HCO3   03/17/21     = 31     Heart murmur    HLD (hyperlipidemia)    HTN (hypertension)    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   Pleural effusion 2008   S/p decortication   Pneumonia 08/02/2011   Pulmonary HTN (HCC)    per cath 2008   Type II diabetes mellitus (HCC) 1999    Tobacco History: Social History   Tobacco Use  Smoking Status Former   Packs/day: 2.50   Years: 25.00   Total pack years: 62.50   Types: Cigarettes, Cigars   Quit date: 10/11/1980   Years since quitting: 41.8  Smokeless Tobacco Never   Counseling given: Not Answered   Outpatient Medications Prior to Visit  Medication Sig Dispense Refill    acetaminophen (TYLENOL) 500 MG tablet Take 1,000 mg by mouth as needed for headache or moderate pain.     albuterol (VENTOLIN HFA) 108 (90 Base) MCG/ACT inhaler Inhale 2 puffs into the lungs every 6 (six) hours as needed for wheezing or shortness of breath. (Patient taking differently: Inhale 1 puff into the lungs as needed for wheezing or shortness of breath.) 8.5 g 0   amLODipine (NORVASC) 10 MG tablet Take 1 tablet (10 mg total) by mouth daily. 90 tablet 1   aspirin 81 MG tablet Take 81 mg by mouth every morning.      atorvastatin (LIPITOR) 20 MG tablet Take 1 tablet (20 mg total) by mouth at bedtime. 90 tablet 1   bisoprolol (ZEBETA) 10 MG tablet Take 1 tablet (10 mg total) by mouth daily. 30 tablet 1   budesonide (PULMICORT) 0.5 MG/2ML nebulizer solution Take  2 mLs (0.5 mg total) by nebulization 2 (two) times daily. 360 mL 1   calcium-vitamin D (OSCAL WITH D) 500-200 MG-UNIT tablet Take 1 tablet by mouth every morning.     cholecalciferol (VITAMIN D3) 25 MCG (1000 UNIT) tablet Take 1,000 Units by mouth at bedtime.     diphenhydramine-acetaminophen (TYLENOL PM) 25-500 MG TABS tablet Take 1 tablet by mouth at bedtime as needed.     FARXIGA 10 MG TABS tablet TAKE 1 TABLET DAILY BEFORE BREAKFAST (Patient taking differently: Take 10 mg by mouth daily.) 90 tablet 3   furosemide (LASIX) 40 MG tablet TAKE 2 TABLETS EVERY MORNING AND 1 TABLET LATE IN THE AFTERNOON (Patient taking differently: Take 40 mg by mouth daily.) 270 tablet 3   guaiFENesin (MUCINEX) 600 MG 12 hr tablet Take 2 tablets (1,200 mg total) by mouth 2 (two) times daily for 10 days. 20 tablet 0   ipratropium-albuterol (DUONEB) 0.5-2.5 (3) MG/3ML SOLN Take 3 mLs by nebulization every 6 (six) hours as needed.     Lancets (ONETOUCH ULTRASOFT) lancets Check blood sugars no more than twice daily 200 each 12   Multiple Vitamins-Minerals (MULTIVITAMINS THER. W/MINERALS) TABS tablet Take 1 tablet by mouth daily. 30 each 5   nitroGLYCERIN  (NITROSTAT) 0.4 MG SL tablet Place 1 tablet (0.4 mg total) under the tongue every 5 (five) minutes x 3 doses as needed for chest pain. 25 tablet 3   ONETOUCH VERIO test strip USE TO CHECK BLOOD SUGAR NO MORE THAN TWICE A DAY 200 strip 12   pantoprazole (PROTONIX) 40 MG tablet Take 1 tablet (40 mg total) by mouth daily before breakfast. 90 tablet 1   tamsulosin (FLOMAX) 0.4 MG CAPS capsule Take 1 capsule (0.4 mg total) by mouth daily after supper. 90 capsule 1   zolpidem (AMBIEN) 10 MG tablet Take 10 mg by mouth at bedtime.     predniSONE (DELTASONE) 10 MG tablet Advised to take prednisone 40 mg daily for 3 days, then prednisone 30 mg daily for 3 days, then prednisone 20 mg daily for 3 days, then prednisone 10 mg daily for 3 days then discontinue. 40 tablet 0   No facility-administered medications prior to visit.     Review of Systems:   Constitutional: No weight loss or gain, night sweats, fevers, chills. + fatigue, lassitude. HEENT: No headaches, difficulty swallowing, tooth/dental problems, or sore throat. No sneezing, itching, ear ache, nasal congestion, or post nasal drip CV:  No chest pain, orthopnea, PND, swelling in lower extremities, anasarca, dizziness, palpitations, syncope Resp: +shortness of breath with exertion; productive cough; wheezing. No hemoptysis.  No chest wall deformity GI:  No heartburn, indigestion, abdominal pain, nausea, vomiting, diarrhea, change in bowel habits, loss of appetite, bloody stools.  MSK:  No joint pain or swelling.  No decreased range of motion.  No back pain. Neuro: No dizziness or lightheadedness.  Psych: No depression or anxiety. Mood stable.     Physical Exam:  BP (!) 142/70 (BP Location: Right Arm, Patient Position: Sitting, Cuff Size: Normal)   Pulse 94   Ht 5\' 9"  (1.753 m)   Wt 183 lb 12.8 oz (83.4 kg)   SpO2 94%   BMI 27.14 kg/m   GEN: Pleasant, interactive, chronically-ill appearing; in no acute distress. HEENT:  Normocephalic and  atraumatic. PERRLA. Sclera white. Nasal turbinates pink, moist and patent bilaterally. No rhinorrhea present. Oropharynx pink and moist, without exudate or edema. No lesions, ulcerations, or postnasal drip.  NECK:  Supple w/ fair  ROM. No JVD present. Normal carotid impulses w/o bruits. Thyroid symmetrical with no goiter or nodules palpated. No lymphadenopathy.   CV: RRR, no m/r/g, no peripheral edema. Pulses intact, +2 bilaterally. No cyanosis, pallor or clubbing. PULMONARY:  Unlabored, regular breathing. Rales and expiratory wheezes bilaterally A&P. No accessory muscle use.  GI: BS present and normoactive. Soft, non-tender to palpation. No organomegaly or masses detected.  MSK: No erythema, warmth or tenderness. Cap refil <2 sec all extrem. No deformities or joint swelling noted.  Neuro: A/Ox3. No focal deficits noted.   Skin: Warm, no lesions or rashe Psych: Normal affect and behavior. Judgement and thought content appropriate.     Lab Results:  CBC    Component Value Date/Time   WBC 13.4 (H) 07/27/2022 1610   RBC 4.93 07/27/2022 1610   HGB 14.9 07/27/2022 1610   HCT 45.4 07/27/2022 1610   PLT 237.0 07/27/2022 1610   MCV 92.2 07/27/2022 1610   MCH 30.3 07/21/2022 0444   MCHC 32.8 07/27/2022 1610   RDW 14.6 07/27/2022 1610   LYMPHSABS 1.5 07/27/2022 1610   MONOABS 0.9 07/27/2022 1610   EOSABS 0.0 07/27/2022 1610   BASOSABS 0.1 07/27/2022 1610    BMET    Component Value Date/Time   NA 138 07/27/2022 1610   NA 144 12/23/2021 1012   K 5.1 07/27/2022 1610   CL 95 (L) 07/27/2022 1610   CO2 34 (H) 07/27/2022 1610   GLUCOSE 178 (H) 07/27/2022 1610   GLUCOSE 118 (H) 10/18/2006 0000   BUN 16 07/27/2022 1610   BUN 11 12/23/2021 1012   CREATININE 0.76 07/27/2022 1610   CREATININE 0.93 07/28/2020 1026   CALCIUM 8.9 07/27/2022 1610   GFRNONAA >60 07/23/2022 0429   GFRAA >60 09/18/2019 0353    BNP    Component Value Date/Time   BNP 269.7 (H) 07/18/2022 1721      Imaging:  DG Chest 2 View  Result Date: 07/30/2022 CLINICAL DATA:  COPD EXAM: CHEST - 2 VIEW COMPARISON:  07/19/2022 FINDINGS: Left-sided implanted cardiac device remains in place. Stable cardiomediastinal contours. Aortic atherosclerosis. Coarsened interstitial markings most pronounced within the bibasilar regions. Overall, degree of interstitial prominence has improved from prior. Small bilateral pleural effusions. No pneumothorax. IMPRESSION: 1. Improving interstitial edema. 2. Small bilateral pleural effusions. Electronically Signed   By: Duanne Guess D.O.   On: 07/30/2022 15:39   CUP PACEART REMOTE DEVICE CHECK  Result Date: 07/29/2022 Scheduled remote reviewed. Normal device function.  The device estimates 3 months until ERI Next remote 08/30/2022. Hassell Halim, RN, CCDS, [employer]  ECHOCARDIOGRAM COMPLETE  Result Date: 07/21/2022    ECHOCARDIOGRAM REPORT   Patient Name:   LYDIA MENG Sr. Date of Exam: 07/21/2022 Medical Rec #:  [id number]         Height:       68.0 in Accession #:    [id number]        Weight:       179.1 lb Date of Birth:  12-16-1940          BSA:          1.950 m Patient Age:    81 years          BP:           134/75 mmHg Patient Gender: M                 HR:           72 bpm.  Exam Location:  Inpatient Procedure: 2D Echo Indications:    acute respiratory distress  History:        Patient has prior history of Echocardiogram examinations, most                 recent 07/30/2021. CHF, CAD, COPD, Arrythmias:Atrial                 Fibrillation; Risk Factors:Hypertension and Diabetes.  Sonographer:    Cathie Hoops Referring Phys: [id number] Jeanella Craze  Sonographer Comments: Suboptimal parasternal window and no subcostal window. Image acquisition challenging due to respiratory motion and Image acquisition challenging due to COPD. IMPRESSIONS  1. Left ventricular ejection fraction, by estimation, is 55 to 60%. The left ventricle has normal function. The left  ventricle has no regional wall motion abnormalities. There is mild concentric left ventricular hypertrophy. Left ventricular diastolic parameters are indeterminate.  2. Peak RV-RA gradient 43 mmHg. Right ventricular systolic function is moderately reduced. The right ventricular size is mildly enlarged.  3. Left atrial size was mildly dilated.  4. Right atrial size was mildly dilated.  5. The mitral valve is normal in structure. Trivial mitral valve regurgitation. No evidence of mitral stenosis.  6. The aortic valve is tricuspid. There is moderate calcification of the aortic valve. Aortic valve regurgitation is not visualized. Aortic valve sclerosis/calcification is present, without any evidence of aortic stenosis. Aortic valve mean gradient measures 6.0 mmHg.  7. The IVC was not visualized. FINDINGS  Left Ventricle: Left ventricular ejection fraction, by estimation, is 55 to 60%. The left ventricle has normal function. The left ventricle has no regional wall motion abnormalities. The left ventricular internal cavity size was normal in size. There is  mild concentric left ventricular hypertrophy. Left ventricular diastolic parameters are indeterminate. Right Ventricle: Peak RV-RA gradient 43 mmHg. The right ventricular size is mildly enlarged. No increase in right ventricular wall thickness. Right ventricular systolic function is moderately reduced. Left Atrium: Left atrial size was mildly dilated. Right Atrium: Right atrial size was mildly dilated. Pericardium: There is no evidence of pericardial effusion. Mitral Valve: The mitral valve is normal in structure. There is mild calcification of the mitral valve leaflet(s). Mild mitral annular calcification. Trivial mitral valve regurgitation. No evidence of mitral valve stenosis. Tricuspid Valve: The tricuspid valve is normal in structure. Tricuspid valve regurgitation is mild. Aortic Valve: The aortic valve is tricuspid. There is moderate calcification of the aortic  valve. Aortic valve regurgitation is not visualized. Aortic valve sclerosis/calcification is present, without any evidence of aortic stenosis. Aortic valve mean gradient measures 6.0 mmHg. Aortic valve peak gradient measures 11.3 mmHg. Aortic valve area, by VTI measures 1.94 cm. Pulmonic Valve: The pulmonic valve was normal in structure. Pulmonic valve regurgitation is not visualized. Aorta: The aortic root is normal in size and structure. IAS/Shunts: No atrial level shunt detected by color flow Doppler.  LEFT VENTRICLE PLAX 2D LVIDd:         4.00 cm     Diastology LVIDs:         2.40 cm     LV e' medial:    7.18 cm/s LV PW:         1.10 cm     LV E/e' medial:  14.1 LV IVS:        1.10 cm     LV e' lateral:   9.14 cm/s LVOT diam:     2.00 cm     LV E/e' lateral: 11.1 LV SV:  61 LV SV Index:   31 LVOT Area:     3.14 cm  LV Volumes (MOD) LV vol d, MOD A2C: 86.7 ml LV vol d, MOD A4C: 76.8 ml LV vol s, MOD A2C: 38.7 ml LV vol s, MOD A4C: 32.6 ml LV SV MOD A2C:     48.0 ml LV SV MOD A4C:     76.8 ml LV SV MOD BP:      48.6 ml RIGHT VENTRICLE RV S prime:     10.10 cm/s TAPSE (M-mode): 1.5 cm LEFT ATRIUM             Index        RIGHT ATRIUM           Index LA diam:        3.80 cm 1.95 cm/m   RA Area:     23.50 cm LA Vol (A2C):   59.9 ml 30.72 ml/m  RA Volume:   68.50 ml  35.13 ml/m LA Vol (A4C):   55.9 ml 28.67 ml/m LA Biplane Vol: 58.2 ml 29.84 ml/m  AORTIC VALVE AV Area (Vmax):    2.08 cm AV Area (Vmean):   1.87 cm AV Area (VTI):     1.94 cm AV Vmax:           168.00 cm/s AV Vmean:          110.000 cm/s AV VTI:            0.316 m AV Peak Grad:      11.3 mmHg AV Mean Grad:      6.0 mmHg LVOT Vmax:         111.00 cm/s LVOT Vmean:        65.600 cm/s LVOT VTI:          0.195 m LVOT/AV VTI ratio: 0.62  AORTA Ao Root diam: 3.20 cm Ao Asc diam:  3.10 cm MITRAL VALVE                TRICUSPID VALVE MV Area (PHT): 4.21 cm     TR Peak grad:   43.6 mmHg MV Decel Time: 180 msec     TR Vmax:        330.00 cm/s MR  Peak grad: 33.4 mmHg MR Vmax:      289.00 cm/s   SHUNTS MV E velocity: 101.00 cm/s  Systemic VTI:  0.20 m MV A velocity: 28.30 cm/s   Systemic Diam: 2.00 cm MV E/A ratio:  3.57 Dalton McleanMD Electronically signed by Wilfred Lacy Signature Date/Time: 07/21/2022/3:32:33 PM    Final    VAS [place] LOWER EXTREMITY VENOUS (DVT)  Result Date: 07/20/2022  Lower Venous DVT Study Patient Name:  THACKERY PORTUGAL Sr.  Date of Exam:   07/20/2022 Medical Rec #: [id number]          Accession #:    [id number] Date of Birth: 1941/05/31           Patient Gender: M Patient Age:   75 years Exam Location:  [facility] Procedure:      VAS [place] LOWER EXTREMITY VENOUS (DVT) Referring Phys: Canary Brim --------------------------------------------------------------------------------  Indications: Swelling.  Risk Factors: None identified. Comparison Study: No prior studies. Performing Technologist: Chanda Busing RVT  Examination Guidelines: A complete evaluation includes B-mode imaging, spectral Doppler, color Doppler, and power Doppler as needed of all accessible portions of each vessel. Bilateral testing is considered an integral part of a complete examination. Limited examinations for reoccurring indications may  be performed as noted. The reflux portion of the exam is performed with the patient in reverse Trendelenburg.  +---------+---------------+---------+-----------+----------+--------------+ RIGHT    CompressibilityPhasicitySpontaneityPropertiesThrombus Aging +---------+---------------+---------+-----------+----------+--------------+ CFV      Full           Yes      Yes                                 +---------+---------------+---------+-----------+----------+--------------+ SFJ      Full                                                        +---------+---------------+---------+-----------+----------+--------------+ FV Prox  Full                                                         +---------+---------------+---------+-----------+----------+--------------+ FV Mid   Full                                                        +---------+---------------+---------+-----------+----------+--------------+ FV DistalFull                                                        +---------+---------------+---------+-----------+----------+--------------+ PFV      Full                                                        +---------+---------------+---------+-----------+----------+--------------+ POP      Full           Yes      Yes                                 +---------+---------------+---------+-----------+----------+--------------+ PTV      Full                                                        +---------+---------------+---------+-----------+----------+--------------+ PERO     Full                                                        +---------+---------------+---------+-----------+----------+--------------+   +---------+---------------+---------+-----------+----------+--------------+ LEFT     CompressibilityPhasicitySpontaneityPropertiesThrombus Aging +---------+---------------+---------+-----------+----------+--------------+ CFV      Full  Yes      Yes                                 +---------+---------------+---------+-----------+----------+--------------+ SFJ      Full                                                        +---------+---------------+---------+-----------+----------+--------------+ FV Prox  Full                                                        +---------+---------------+---------+-----------+----------+--------------+ FV Mid   Full                                                        +---------+---------------+---------+-----------+----------+--------------+ FV DistalFull                                                         +---------+---------------+---------+-----------+----------+--------------+ PFV      Full                                                        +---------+---------------+---------+-----------+----------+--------------+ POP      Full           Yes      Yes                                 +---------+---------------+---------+-----------+----------+--------------+ PTV      Full                                                        +---------+---------------+---------+-----------+----------+--------------+ PERO     Full                                                        +---------+---------------+---------+-----------+----------+--------------+     Summary: RIGHT: - There is no evidence of deep vein thrombosis in the lower extremity.  - No cystic structure found in the popliteal fossa.  LEFT: - There is no evidence of deep vein thrombosis in the lower extremity.  - No cystic structure found in the popliteal fossa.  *See table(s) above for measurements and observations. Electronically signed by Deitra Mayo MD on 07/20/2022 at 6:18:54  PM.    Final    DG CHEST PORT 1 VIEW  Result Date: 07/19/2022 CLINICAL DATA:  Shortness of breath EXAM: PORTABLE CHEST 1 VIEW COMPARISON:  07/18/2022 FINDINGS: Left chest cardiac device with unchanged lead position. Unchanged cardiomegaly. Aortic atherosclerosis. Diffuse interstitial opacities, likely pulmonary edema. No definite pleural. No pneumothorax. No acute osseous abnormality. IMPRESSION: Cardiomegaly and pulmonary edema. Electronically Signed   By: Wiliam Ke M.D.   On: 07/19/2022 12:48   DG Chest Port 1 View  Result Date: 07/18/2022 CLINICAL DATA:  Cough and shortness of breath. Respiratory distress. EXAM: PORTABLE CHEST 1 VIEW COMPARISON:  Two-view chest x-ray 11/11/2021 FINDINGS: Heart is enlarged. Pacing wires are in place. Atherosclerotic calcifications are present in the aortic arch. Diffuse interstitial pattern is  increased, suggesting edema superimposed on chronic interstitial changes. No focal airspace consolidation is present. No definite effusions are present. IMPRESSION: 1. Cardiomegaly and increased diffuse interstitial pattern compatible with congestive heart failure. 2. Aortic atherosclerosis. Electronically Signed   By: Marin Roberts M.D.   On: 07/18/2022 17:45   CUP PACEART INCLINIC DEVICE CHECK  Result Date: 07/16/2022 CRT-P device check in clinic. Normal device function. Thresholds, sensing, impedance consistent with previous measurements. Histograms appropriate for patient and level of activity. Brief known ventricular high rate episodes. Patient bi-ventricularly pacing 98.8% of the time. Device programmed with appropriate safety margins. Device heart failure diagnostics are within normal limits and stable over time. Estimated longevity 3 months. Patient enrolled in monthly remote follow-up. No atrial lead.Ancil Boozer, BSN, RN        Latest Ref Rng & Units 09/18/2020    8:34 AM 10/18/2014    1:02 PM  PFT Results  FVC-Pre L 1.83  2.49  P  FVC-Predicted Pre % 49  63  P  FVC-Post L 1.89  2.32  P  FVC-Predicted Post % 50  58  P  Pre FEV1/FVC % % 73  68  P  Post FEV1/FCV % % 75  72  P  FEV1-Pre L 1.33  1.71  P  FEV1-Predicted Pre % 50  59  P  FEV1-Post L 1.41  1.68  P  DLCO uncorrected ml/min/mmHg 11.58  15.84  P  DLCO UNC% % 50  53  P  DLCO corrected ml/min/mmHg 11.58    DLCO COR %Predicted % 50    DLVA Predicted % 119  85  P  TLC L 4.82  5.03  P  TLC % Predicted % 72  75  P  RV % Predicted % 117  107  P    P Preliminary result    No results found for: "NITRICOXIDE"      Assessment & Plan:   COPD exacerbation (HCC) Slow to resolve AECOPD; still with bronchospasm on exam and increased dyspnea from baseline. Encouraged him to increase duonebs to four times daily. He will continue budesonide twice daily. We will treat him with prednisone taper today. Encouraged him to work  on mucociliary clearance therapies. PFTs to evaluate disease progression. Close follow up.  Patient Instructions  Continue ipratropium-albuterol 3 mL neb every 6 hours Continue budesonide 2 mL neb twice daily.  Brush tongue and rinse mouth afterwards Continue supplemental oxygen 4 L/min for goal greater than 88 to 90% Continue Mucinex 600 to 1200 mg twice daily Continue pantoprazole 1 tab daily  Prednisone taper. 4 tabs for 3 days, then 3 tabs for 3 days, 2 tabs for 3 days, then 1 tab for 3 days, then stop.  Take in  a.m. with food Increase Lasix to 40 mg twice daily for the next 5 days.  After that resume 40 mg daily.  Potassium 20 meq daily while on increased dose of lasix   Follow up in one week with Dr. Sherene [name] or Philis Nettle. If symptoms do not improve or worsen, please contact office for sooner follow up or seek emergency care.     Acute on chronic diastolic heart failure (HCC) CXR still with some increased interstitial edema but improved when compared to previous. BMET stable from 10/17. I will have him increase his lasix to Twice daily dosing for the next 5 days and add on potassium supplement while on this. Recheck BMET upon return next week. He's on farxiga, bisoprolol, and amlodipine. Follow up with cardiology as scheduled.   Cor pulmonale (chronic) (HCC) Moderately elevated PASP from echo 2021; suspect to be cor pulmonale from severe COPD. 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 See above plan.  Chronic respiratory failure with hypoxia and hypercapnia (HCC) Stable on 4 lpm POC today with O2 sats 94%. Baseline is 2-3 lpm. Advised to monitor for goal >88-90%. ED precautions should he develop worsening hypoxia with increased O2 requirements   ILD (interstitial lung disease) (HCC) Groundglass opacities on CT imaging. Question underlying ILD. He is still having acute symptoms so we will plan for HRCT in 2 months. He will need repeat PFTs to assess for underlying  restriction/diffusion defect.    I spent 45 minutes of dedicated to the care of this patient on the date of this encounter to include pre-visit review of records, face-to-face time with the patient discussing conditions above, post visit ordering of testing, clinical documentation with the electronic health record, making appropriate referrals as documented, and communicating necessary findings to members of the patients care team.  Noemi Chapel, NP 07/30/2022  Pt aware and understands NP's role.

## 2022-07-30 NOTE — Assessment & Plan Note (Addendum)
Slow to resolve AECOPD; still with bronchospasm on exam and increased dyspnea from baseline. Encouraged him to increase duonebs to four times daily. He will continue budesonide twice daily. We will treat him with prednisone taper today. Encouraged him to work on mucociliary clearance therapies. PFTs to evaluate disease progression. Close follow up.  Patient Instructions  Continue ipratropium-albuterol 3 mL neb every 6 hours Continue budesonide 2 mL neb twice daily.  Brush tongue and rinse mouth afterwards Continue supplemental oxygen 4 L/min for goal greater than 88 to 90% Continue Mucinex 600 to 1200 mg twice daily Continue pantoprazole 1 tab daily  Prednisone taper. 4 tabs for 3 days, then 3 tabs for 3 days, 2 tabs for 3 days, then 1 tab for 3 days, then stop.  Take in a.m. with food Increase Lasix to 40 mg twice daily for the next 5 days.  After that resume 40 mg daily.  Potassium 20 meq daily while on increased dose of lasix   Follow up in one week with Dr. Melvyn Novas or Alanson Aly. If symptoms do not improve or worsen, please contact office for sooner follow up or seek emergency care.

## 2022-07-31 ENCOUNTER — Emergency Department (HOSPITAL_BASED_OUTPATIENT_CLINIC_OR_DEPARTMENT_OTHER): Payer: Medicare Other

## 2022-07-31 ENCOUNTER — Emergency Department (HOSPITAL_BASED_OUTPATIENT_CLINIC_OR_DEPARTMENT_OTHER)
Admission: EM | Admit: 2022-07-31 | Discharge: 2022-07-31 | Disposition: A | Discharge status: Home / Self Care | Payer: Medicare Other | Attending: Emergency Medicine | Admitting: Emergency Medicine

## 2022-07-31 ENCOUNTER — Encounter (HOSPITAL_BASED_OUTPATIENT_CLINIC_OR_DEPARTMENT_OTHER): Payer: Self-pay | Admitting: Emergency Medicine

## 2022-07-31 ENCOUNTER — Other Ambulatory Visit: Payer: Self-pay

## 2022-07-31 DIAGNOSIS — Z87891 Personal history of nicotine dependence: Secondary | ICD-10-CM | POA: Diagnosis not present

## 2022-07-31 DIAGNOSIS — J449 Chronic obstructive pulmonary disease, unspecified: Secondary | ICD-10-CM | POA: Insufficient documentation

## 2022-07-31 DIAGNOSIS — Z7982 Long term (current) use of aspirin: Secondary | ICD-10-CM | POA: Diagnosis not present

## 2022-07-31 DIAGNOSIS — Z794 Long term (current) use of insulin: Secondary | ICD-10-CM | POA: Insufficient documentation

## 2022-07-31 DIAGNOSIS — I509 Heart failure, unspecified: Secondary | ICD-10-CM | POA: Diagnosis not present

## 2022-07-31 DIAGNOSIS — Z79899 Other long term (current) drug therapy: Secondary | ICD-10-CM | POA: Insufficient documentation

## 2022-07-31 DIAGNOSIS — W010XXA Fall on same level from slipping, tripping and stumbling without subsequent striking against object, initial encounter: Secondary | ICD-10-CM | POA: Insufficient documentation

## 2022-07-31 DIAGNOSIS — S62323A Displaced fracture of shaft of third metacarpal bone, left hand, initial encounter for closed fracture: Secondary | ICD-10-CM | POA: Diagnosis not present

## 2022-07-31 DIAGNOSIS — I110 Hypertensive heart disease with heart failure: Secondary | ICD-10-CM | POA: Insufficient documentation

## 2022-07-31 DIAGNOSIS — Z9981 Dependence on supplemental oxygen: Secondary | ICD-10-CM | POA: Diagnosis not present

## 2022-07-31 DIAGNOSIS — Z950 Presence of cardiac pacemaker: Secondary | ICD-10-CM | POA: Diagnosis not present

## 2022-07-31 DIAGNOSIS — I2510 Atherosclerotic heart disease of native coronary artery without angina pectoris: Secondary | ICD-10-CM | POA: Diagnosis not present

## 2022-07-31 DIAGNOSIS — Z7951 Long term (current) use of inhaled steroids: Secondary | ICD-10-CM | POA: Insufficient documentation

## 2022-07-31 DIAGNOSIS — Z7952 Long term (current) use of systemic steroids: Secondary | ICD-10-CM | POA: Diagnosis not present

## 2022-07-31 DIAGNOSIS — S6992XA Unspecified injury of left wrist, hand and finger(s), initial encounter: Secondary | ICD-10-CM | POA: Diagnosis present

## 2022-07-31 DIAGNOSIS — E119 Type 2 diabetes mellitus without complications: Secondary | ICD-10-CM | POA: Diagnosis not present

## 2022-07-31 NOTE — ED Triage Notes (Signed)
Pt reports he fell 2 days ago and had a L hand injury while bracing himself. Fall caused by slip/trip when getting out of the car. Hand swelling initially decreased, but then returned. L hand visibly more swollen than R hand. Has pain in anterior distal palm just below fingers.  Pt on home O2 4L [place].

## 2022-07-31 NOTE — ED Provider Notes (Signed)
[facility] EMERGENCY DEPARTMENT Provider Note   CSN: [id number] Arrival date & time: 07/31/22  3235     History  Chief Complaint  Patient presents with   Hand Injury    Bradley HARR Sr. is a 81 y.o. male.  Patient with injury to left hand that occurred 2 days ago.  Patient tripped going into the auto parts store and landed on his left hand.  [place] Spaniel initially it seemed okay but then it started to swell now is got black and blue discoloration to it.  Patient denies any other injuries related to the fall.  Did not hit his head no loss of consciousness.  Patient states he is not on any blood thinners other than a baby aspirin a day.  Patient has significant COPD and is on 4 L of nasal cannula oxygen at all times also has hypertension hyperlipidemia history of atrial fibrillation peripheral vascular disease pacemaker intracranial bleed coronary artery disease congestive heart failure type 2 diabetes.  Patient is a former smoker quit in 1982       Home Medications Prior to Admission medications   Medication Sig Start Date End Date Taking? Authorizing Provider  acetaminophen (TYLENOL) 500 MG tablet Take 1,000 mg by mouth as needed for headache or moderate pain.    [provider]  albuterol (VENTOLIN HFA) 108 (90 Base) MCG/ACT inhaler Inhale 2 puffs into the lungs every 6 (six) hours as needed for wheezing or shortness of breath. Patient taking differently: Inhale 1 puff into the lungs as needed for wheezing or shortness of breath. 03/11/21   Ghimire, Werner Lean, MD  amLODipine (NORVASC) 10 MG tablet Take 1 tablet (10 mg total) by mouth daily. 06/07/22   [name], MD  aspirin 81 MG tablet Take 81 mg by mouth every morning.     [provider]  atorvastatin (LIPITOR) 20 MG tablet Take 1 tablet (20 mg total) by mouth at bedtime. 06/07/22   [name], MD  bisoprolol (ZEBETA) 10 MG tablet Take 1 tablet (10 mg total) by mouth daily. 07/23/22   Cipriano Bunker, MD   budesonide (PULMICORT) 0.5 MG/2ML nebulizer solution Take 2 mLs (0.5 mg total) by nebulization 2 (two) times daily. 11/26/19   [name], MD  calcium-vitamin D (OSCAL WITH D) 500-200 MG-UNIT tablet Take 1 tablet by mouth every morning.    [provider]  cholecalciferol (VITAMIN D3) 25 MCG (1000 UNIT) tablet Take 1,000 Units by mouth at bedtime.    [provider]  diphenhydramine-acetaminophen (TYLENOL PM) 25-500 MG TABS tablet Take 1 tablet by mouth at bedtime as needed.    [provider]  FARXIGA 10 MG TABS tablet TAKE 1 TABLET DAILY BEFORE BREAKFAST Patient taking differently: Take 10 mg by mouth daily. 03/29/22   [name], MD  furosemide (LASIX) 40 MG tablet TAKE 2 TABLETS EVERY MORNING AND 1 TABLET LATE IN THE AFTERNOON Patient taking differently: Take 40 mg by mouth daily. 02/26/22   [name], MD  furosemide (LASIX) 40 MG tablet Take 1 tablet (40 mg total) by mouth 2 (two) times daily for 5 days. 07/30/22 08/04/22  Cobb, Ruby Cola, NP  guaiFENesin (MUCINEX) 600 MG 12 hr tablet Take 2 tablets (1,200 mg total) by mouth 2 (two) times daily for 10 days. 07/23/22 08/02/22  Cipriano Bunker, MD  ipratropium-albuterol (DUONEB) 0.5-2.5 (3) MG/3ML SOLN Take 3 mLs by nebulization every 6 (six) hours as needed. 07/27/22   [name], MD  Lancets (ONETOUCH ULTRASOFT) lancets Check blood sugars no more than twice daily 03/08/17   [name], MD  Multiple Vitamins-Minerals (MULTIVITAMINS THER. W/MINERALS) TABS tablet Take 1 tablet by mouth daily. 09/14/13   [name], MD  nitroGLYCERIN (NITROSTAT) 0.4 MG SL tablet Place 1 tablet (0.4 mg total) under the tongue every 5 (five) minutes x 3 doses as needed for chest pain. 09/07/21   [name], MD  [facility] VERIO test strip USE TO CHECK BLOOD SUGAR NO MORE THAN TWICE A DAY 02/09/21   [name], MD  pantoprazole (PROTONIX) 40 MG tablet Take 1 tablet (40 mg total) by mouth daily before breakfast. 03/05/22   [name],  MD  potassium chloride SA (KLOR-CON M) 20 MEQ tablet Take 1 tablet (20 mEq total) by mouth daily for 5 doses. 07/30/22 08/04/22  Cobb, Ruby Cola, NP  predniSONE (DELTASONE) 10 MG tablet 4 tabs for 3 days, then 3 tabs for 3 days, 2 tabs for 3 days, then 1 tab for 3 days, then stop 07/30/22   Cobb, Ruby Cola, NP  tamsulosin (FLOMAX) 0.4 MG CAPS capsule Take 1 capsule (0.4 mg total) by mouth daily after supper. 05/27/22   [name], MD  zolpidem (AMBIEN) 10 MG tablet Take 10 mg by mouth at bedtime.    [provider]      Allergies    Hydrocodone, Tramadol, Tadalafil, and Avelox [moxifloxacin hydrochloride]    Review of Systems   Review of Systems  Constitutional:  Negative for chills and fever.  HENT:  Negative for rhinorrhea and sore throat.   Eyes:  Negative for visual disturbance.  Respiratory:  Negative for cough and shortness of breath.   Cardiovascular:  Negative for chest pain and leg swelling.  Gastrointestinal:  Negative for abdominal pain, diarrhea, nausea and vomiting.  Genitourinary:  Negative for dysuria.  Musculoskeletal:  Positive for joint swelling. Negative for back pain and neck pain.  Skin:  Negative for rash.  Neurological:  Negative for dizziness, light-headedness and headaches.  Hematological:  Does not bruise/bleed easily.  Psychiatric/Behavioral:  Negative for confusion.     Physical Exam Updated Vital Signs BP (!) 108/59   Pulse 75   Temp 97.7 F (36.5 C) (Oral)   Resp 16   SpO2 95%  Physical Exam Vitals and nursing note reviewed.  Constitutional:      General: He is not in acute distress.    Appearance: He is well-developed.  HENT:     Head: Normocephalic and atraumatic.  Eyes:     Conjunctiva/sclera: Conjunctivae normal.  Cardiovascular:     Rate and Rhythm: Normal rate and regular rhythm.     Heart sounds: No murmur heard. Pulmonary:     Effort: Pulmonary effort is normal. No respiratory distress.     Breath sounds: Normal breath  sounds.  Abdominal:     Palpations: Abdomen is soft.     Tenderness: There is no abdominal tenderness.  Musculoskeletal:        General: Swelling, tenderness and signs of injury present.     Cervical back: Neck supple.     Comments: Patient with a lot of black and blue into the left hand.  No snuffbox tenderness.  Good cap refill some pain with movement of the fingers and at the wrist.  No elbow pain no shoulder pain.  Sensation intact.  Right hand normal.  Left hand radial pulse 2+  Skin:    General: Skin is warm  and dry.     Capillary Refill: Capillary refill takes less than 2 seconds.  Neurological:     Mental Status: He is alert.  Psychiatric:        Mood and Affect: Mood normal.     ED Results / Procedures / Treatments   Labs (all labs ordered are listed, but only abnormal results are displayed) Labs Reviewed - No data to display  EKG None  Radiology DG Hand Complete Left  Result Date: 07/31/2022 CLINICAL DATA:  Mechanical fall 2 days ago with left hand injury while bracing EXAM: LEFT HAND - COMPLETE 3+ VIEW COMPARISON:  None Available. FINDINGS: Fracture: Oblique fracture of the third metacarpal shaft with 4 mm dorsal and 1 cortical shaft with radial displacement of the distal fracture fragment Healing: None. Soft tissue: Normal. IMPRESSION: Mildly displaced oblique fracture of the third metacarpal shaft. Electronically Signed   By: Darrin Nipper M.D.   On: 07/31/2022 08:21   DG Chest 2 View  Result Date: 07/30/2022 CLINICAL DATA:  COPD EXAM: CHEST - 2 VIEW COMPARISON:  07/19/2022 FINDINGS: Left-sided implanted cardiac device remains in place. Stable cardiomediastinal contours. Aortic atherosclerosis. Coarsened interstitial markings most pronounced within the bibasilar regions. Overall, degree of interstitial prominence has improved from prior. Small bilateral pleural effusions. No pneumothorax. IMPRESSION: 1. Improving interstitial edema. 2. Small bilateral pleural effusions.  Electronically Signed   By: Davina Poke D.O.   On: 07/30/2022 15:39    Procedures Procedures    Medications Ordered in ED Medications - No data to display  ED Course/ Medical Decision Making/ A&P                           Medical Decision Making Amount and/or Complexity of Data Reviewed Radiology: ordered.   Patient with a fall 2 days ago.  Clinically suspicious for fracture.  X-ray shows mildly displaced oblique fracture of the third metatarsal shaft.  No other acute findings.  Patient had no snuffbox tenderness.  We will splint and have him follow-up with with sports medicine.  We will place in a short arm splint.   Final Clinical Impression(s) / ED Diagnoses Final diagnoses:  Closed displaced fracture of shaft of third metacarpal bone of left hand, initial encounter    Rx / DC Orders ED Discharge Orders     None         Fredia Sorrow, MD 07/31/22 0830

## 2022-07-31 NOTE — Discharge Instructions (Addendum)
Keep the splint in place keep it dry.  Make an appointment follow-up with sports medicine upstairs call on Monday to set up the appointment.  X-rays do show a fracture in your hand third metacarpal bone.

## 2022-08-02 ENCOUNTER — Telehealth: Payer: Self-pay | Admitting: Internal Medicine

## 2022-08-02 ENCOUNTER — Other Ambulatory Visit: Payer: Self-pay | Admitting: Nurse Practitioner

## 2022-08-02 DIAGNOSIS — I5033 Acute on chronic diastolic (congestive) heart failure: Secondary | ICD-10-CM

## 2022-08-02 MED ORDER — BUDESONIDE 0.5 MG/2ML IN SUSP: 0.5000 mg | Freq: Two times a day (BID) | RESPIRATORY_TRACT | 1 refills | Status: DC

## 2022-08-02 MED ORDER — IPRATROPIUM-ALBUTEROL 0.5-2.5 (3) MG/3ML IN SOLN: 3.0000 mL | Freq: Four times a day (QID) | RESPIRATORY_TRACT | 1 refills | Status: DC | PRN

## 2022-08-02 NOTE — Telephone Encounter (Signed)
Pt called stating he would like budesonide (PULMICORT) 0.5 MG/2ML nebulizer solution & ipratropium-albuterol (DUONEB) 0.5-2.5 (3) MG/3 ML SOLN sent to [facility] for refills.  He would also like a call back so he know refills were submitted successfully.

## 2022-08-02 NOTE — Telephone Encounter (Signed)
Rx's sent. Mychart message sent to Pt informing they have been sent to [facility] for him.

## 2022-08-03 ENCOUNTER — Ambulatory Visit (INDEPENDENT_AMBULATORY_CARE_PROVIDER_SITE_OTHER): Payer: Medicare Other | Admitting: Family Medicine

## 2022-08-03 ENCOUNTER — Encounter: Payer: Self-pay | Admitting: Family Medicine

## 2022-08-03 VITALS — BP 100/60 | Ht 68.0 in | Wt 208.0 lb

## 2022-08-03 DIAGNOSIS — S62323A Displaced fracture of shaft of third metacarpal bone, left hand, initial encounter for closed fracture: Secondary | ICD-10-CM | POA: Diagnosis present

## 2022-08-03 NOTE — Assessment & Plan Note (Signed)
Acutely occurring after recent fall.  No malrotation or misalignment. -Counseled on home exercise therapy and supportive care. - re-dressed his splint today  - f/u in 2 weeks to re-image

## 2022-08-03 NOTE — Progress Notes (Signed)
Bradley Jock Sr. - 81 y.o. male MRN [id number]  Date of birth: 02-22-1941  SUBJECTIVE:  Including CC & ROS.  No chief complaint on file.   Bradley Jock Sr. is a 81 y.o. male that is presenting with left hand pain. He had a fall. Was found to have a fracture and placed in a splint.  Review of the emergency department note from 10/21 shows he was placed in a short arm splint. Independent review of the left hand x-ray from 10/21 shows a oblique fracture with radial displacement.  Review of Systems See HPI   HISTORY: Past Medical, Surgical, Social, and Family History Reviewed & Updated per EMR.   Pertinent Historical Findings include:  Past Medical History:  Diagnosis Date   Abnormal CT scan, chest 2012   CT chest, several lymphadenopathies. Sees pulmonary   Anemia    intermittent   Arthritis    "?back" (09/19/2018)   Atrial fibrillation (HCC)    s/p AV node ablation & BiV PPM implantation 09/08/11 (op dictation pending)   BPH (benign prostatic hyperplasia)    Saw Dr Reece Agar 2004, normal renal u/s   Bronchitis 08/26/2017   CAD (coronary artery disease)    CHF (congestive heart failure) ([place])    Thought primarily to be non-systolic although EF down (EF 45-50% 12/2010, down to 35-40% 09/05/11), cath 2008 with no CAD, nuclear study 07/2011 showing Small area of reversibility in the distal ant/lat wall the left ventricle suspicious for ischemia/septal wall HK but felt to be low risk  (per D/C Summary 07/2011)   Chronic bronchitis ([place])    "get it ~ q yr"   Chronic diastolic heart failure ([place])         Chronic respiratory failure with hypoxia and hypercapnia ([place]) 09/14/2010   Followed in Pulmonary clinic/ [facility]/ [place]  Started on 02 2lpm at discharge 09/23/11   - PFT's 10/28/2011  FEV1  1.40 (51%)  with ratio 70 and no better p B2 and DLCO 53 corrects to 101    - PFT's 10/18/2014    FEV1 1.71 (59%) with ratio 68 and no sign change p B2 and DLCO 53 corrects to 85  -  HC03    07/28/20  = 33  -  HCO3   03/17/21     = 31     Heart murmur    HLD (hyperlipidemia)    HTN (hypertension)    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   Pleural effusion 2008   S/p decortication   Pneumonia 08/02/2011   Pulmonary HTN ([place])    per cath 2008   Type II diabetes mellitus ([place]) 1999    Past Surgical History:  Procedure Laterality Date   BI-VENTRICULAR PACEMAKER INSERTION Left 09/08/2011   Procedure: BI-VENTRICULAR PACEMAKER INSERTION (CRT-P);  Surgeon: Evans Lance, MD;  Location: [facility] CATH LAB;  Service: Cardiovascular;  Laterality: Left;   CARDIAC CATHETERIZATION     "couple times; never had balloon or stent" (09/19/2018)   CATARACT EXTRACTION W/ INTRAOCULAR LENS  IMPLANT, BILATERAL Bilateral 08/2018   COLONOSCOPY  03/10/11   normal   INSERT / REPLACE / REMOVE PACEMAKER  09/08/11   pacemaker placement   LUNG DECORTICATION     PLEURAL SCARIFICATION  pneumothorax with fibrothorax  ~ 2010   TONSILLECTOMY     "as a kid"      PHYSICAL EXAM:  VS: BP 100/60 (BP Location: Right Arm, Patient Position: Sitting)   Ht 5\' 8"  (1.727 m)   Wt 208 lb (94.3 kg)   BMI 31.63 kg/m  Physical Exam Gen: NAD, alert, cooperative with exam, well-appearing MSK:  Neurovascularly intact       ASSESSMENT & PLAN:   Displaced fracture of shaft of third metacarpal bone, left hand, initial encounter for closed fracture Acutely occurring after recent fall.  No malrotation or misalignment. -Counseled on home exercise therapy and supportive care. - re-dressed his splint today  - f/u in 2 weeks to re-image

## 2022-08-03 NOTE — Patient Instructions (Signed)
Nice to meet you Please use the splint   Please send me a message in MyChart with any questions or updates.  Please see me back in 2 weeks.   --Dr. Raeford Razor

## 2022-08-04 ENCOUNTER — Telehealth: Payer: Self-pay | Admitting: Internal Medicine

## 2022-08-04 NOTE — Telephone Encounter (Signed)
He should return to his normal lasix dosing. Thanks.

## 2022-08-04 NOTE — Telephone Encounter (Signed)
[facility] states [facility] is out of [place] and pt would like to know if alternatives can be sent in as he is running low on medications. He would like a call back please advise.    [place], [place] [address] [place] [address] Phone: [phone]  Fax: [phone]

## 2022-08-04 NOTE — Telephone Encounter (Signed)
Spoke w/ Pt- informed I can send both medications to his local pharmacy instead of [facility]. He informed that would be okay- he currently has enough but will call when ready for refills.

## 2022-08-06 NOTE — Progress Notes (Signed)
Remote pacemaker transmission.   

## 2022-08-09 ENCOUNTER — Encounter: Payer: Self-pay | Admitting: Nurse Practitioner

## 2022-08-09 ENCOUNTER — Ambulatory Visit (INDEPENDENT_AMBULATORY_CARE_PROVIDER_SITE_OTHER): Payer: Medicare Other | Admitting: Nurse Practitioner

## 2022-08-09 VITALS — BP 136/80 | HR 85 | Ht 69.0 in | Wt 207.4 lb

## 2022-08-09 DIAGNOSIS — J9612 Chronic respiratory failure with hypercapnia: Secondary | ICD-10-CM

## 2022-08-09 DIAGNOSIS — J849 Interstitial pulmonary disease, unspecified: Secondary | ICD-10-CM | POA: Diagnosis not present

## 2022-08-09 DIAGNOSIS — J9611 Chronic respiratory failure with hypoxia: Secondary | ICD-10-CM

## 2022-08-09 DIAGNOSIS — J441 Chronic obstructive pulmonary disease with (acute) exacerbation: Secondary | ICD-10-CM | POA: Diagnosis not present

## 2022-08-09 DIAGNOSIS — I5033 Acute on chronic diastolic (congestive) heart failure: Secondary | ICD-10-CM

## 2022-08-09 LAB — BASIC METABOLIC PANEL
BUN: 25 mg/dL — ABNORMAL HIGH (ref 6–23)
CO2: 36 mEq/L — ABNORMAL HIGH (ref 19–32)
Calcium: 9 mg/dL (ref 8.4–10.5)
Chloride: 92 mEq/L — ABNORMAL LOW (ref 96–112)
Creatinine, Ser: 0.92 mg/dL (ref 0.40–1.50)
GFR: 78.02 mL/min (ref >60.00)
Glucose, Bld: 285 mg/dL — ABNORMAL HIGH (ref 70–99)
Potassium: 4.7 mEq/L (ref 3.5–5.1)
Sodium: 135 mEq/L (ref 135–145)

## 2022-08-09 NOTE — Patient Instructions (Addendum)
Continue ipratropium-albuterol 3 mL neb every 6 hours Continue budesonide 2 mL neb twice daily.  Brush tongue and rinse mouth afterwards Continue supplemental oxygen 4 L/min for goal greater than 88 to 90% Continue Mucinex 600 to 1200 mg twice daily Continue pantoprazole 1 tab daily Finish prednisone taper   Check BMET today - may increase lasix back to twice daily depending on results  Contact cardiology for hospital follow up and to discuss weight gain   Follow up end of December (after 12/25) with Dr. Melvyn Novas to review CT scan results. If symptoms do not improve or worsen, please contact office for sooner follow up or seek emergency care.

## 2022-08-09 NOTE — Assessment & Plan Note (Signed)
He's had weight gain since he was here last of around 20 lb. Appears that his weight was up to 208 lb at OV with sports medicine on 10/24. He's down to 203 lb today. He completed twice daily dosing of lasix and has returned to 40 mg daily. Oddly, he appears euvolemic on exam without BLE edema and his respiratory symptoms have improved. His lung exam was also improved. We will recheck BMET today. Message sent to cardiology. Advised him to monitor weights at home; 2-3 lb overnight or 5 lb in a week.

## 2022-08-09 NOTE — Assessment & Plan Note (Signed)
Stable without increased O2 requirement. Continue 4 lpm for goal >88-90%

## 2022-08-09 NOTE — Progress Notes (Signed)
@Patient  ID: Bradley [name]., male    DOB: [birth year], 81 y.o.   MRN: [id number]  Chief Complaint  Patient presents with   Follow-up    Pt f/u he reports he is feeling better, has another day left on pred. Taper, pt states his breathing is better.    Referring provider: Colon Branch, MD  HPI: 81 year old male, former smoker followed for COPD, cor pulmonale, and chronic respiratory failure on supplemental O2. He is a patient of Dr. Gustavus Bryant and last seen in office on 07/30/2022 by [facility] NP. Past medical history significant for HTN, CHF, non-ischemic cardiomyopathy, sinoatrial node dysfunction s/p pacemaker, CAD, GERD, DM, HLD, anxiety.   He was recently admitted from 07/18/2022 to 07/23/2022 for acute on chronic respiratory failure related to rhinovirus, AECOPD, and acute CHF exacerbation. He had significant O2 requirements; initially requiring BiPAP and transitioned to 10 lpm HFNC. He was treated with IV steroids, diuretics, and empiric doxycycline. Concerned for possible underlying ILD with groundglass opacities on imaging; advised outpatient follow up. Discharged on triple therapy nebs with duonebs q 6h and budesonide Twice daily.   TEST/EVENTS:  10/28/2011 PFT: FEV1 51%, ratio 70. No BD. DLCO 53, corrects to 101 10/18/2014 PFT: FEV1 59%, ratio 68, no BD. DLCO corrects to 85  03/19/2021: OV with Dr. Melvyn Novas. Recent admission from 03/05/2021-03/11/2021 for acute on chronic respiratory failure r/t CAP, AECOPD and decompensated HF. Discharged on baseline 2-3 lpm supplemental O2.   07/30/2022: OV with Gram Siedlecki NP for hospital follow up. He was seen by his PCP, Dr. Larose Kells, on 10/17 and noted to be 89% on 3 lpm POC; he was advised to increase to 4 lpm until he was seen here. He was also noted to still have some wheezing on exam; decided to hold off on further prednisone. Today, he tells me that he is doing better since he was discharged home. He still feels more short winded from his baseline. He's not quite back to  his normal activities and hasn't been doing as much around the house. His cough is better; occasionally productive with clear to rarely green sputum. He has been wheezing still. Denies fevers, chills, leg swelling, hemoptysis, orthopnea, PND, night sweats, weight gain. Completes budesonide nebs twice a day. Only doing duonebs twice daily. He takes lasix 40 mg once a day. Not consistently monitoring his oxygen levels. CXR with persistent edema; advised to increase lasix to 40 mg Twice daily for 5 days then resume to 40 mg daily. Treated for possible concurrent AECOPD with prednisone taper.   08/09/2022: Today - follow up Patient presents today with his wife for follow-up.  He is feeling much better since he was here last.  He has 1 day left of steroids.  He is back on his 40 mg of Lasix.  Feels like his breathing is back to his baseline.  No significant cough or increased chest congestion.  Has not noticed any wheezing.  Denies fevers, chills, leg swelling, orthopnea. He continues on triple therapy nebs with duoneb and budesonide. Back on lasix 40 mg daily. Wearing supplemental oxygen 4 lpm POC.   Allergies  Allergen Reactions   Hydrocodone Other (See Comments)    "given to him in the hospital; went thru withdrawals once home; dr said not to take it again" (09/27/2012) dizziness   Tramadol Other (See Comments)    "given to him in the hospital; went thru withdrawals once home; dr said not to take it again" (09/27/2012) dizziness  Tadalafil Other (See Comments)    headache, backache, dizziness   Avelox [Moxifloxacin Hydrochloride] Itching and Other (See Comments)    Headache, dizziness    Immunization History  Administered Date(s) Administered   Fluad Quad(high Dose 65+) 06/26/2019, 08/11/2020, 07/13/2022   Influenza Split 10/01/2011   Influenza Whole 08/14/2007, 07/11/2009, 09/14/2010   Influenza, High Dose Seasonal PF 09/14/2013, 07/15/2015, 07/09/2016, 07/05/2017, 08/03/2018    Influenza,inj,Quad PF,6+ Mos 07/05/2014   Influenza-Unspecified 08/11/2012, 08/11/2021   Janssen (J&J) SARS-COV-2 Vaccination 01/12/2020, 09/19/2020   PFIZER Comirnaty(Gray Top)Covid-19 Tri-Sucrose Vaccine 03/18/2021   Pfizer Covid-19 Vaccine Bivalent Booster 24yrs & up 08/11/2021   Pneumococcal Conjugate-13 03/12/2015   Pneumococcal Polysaccharide-23 07/11/2004, 03/30/2009, 11/01/2017, 05/11/2019   Td 03/12/2003   Tetanus 09/14/2013   Zoster Recombinat (Shingrix) 03/11/2022   Zoster, Live 04/20/2011    Past Medical History:  Diagnosis Date   Abnormal CT scan, chest 2012   CT chest, several lymphadenopathies. Sees pulmonary   Anemia    intermittent   Arthritis    "?back" (09/19/2018)   Atrial fibrillation (HCC)    s/p AV node ablation & BiV PPM implantation 09/08/11 (op dictation pending)   BPH (benign prostatic hyperplasia)    Saw Dr [name] 2004, normal renal u/s   Bronchitis 08/26/2017   CAD (coronary artery disease)    CHF (congestive heart failure) (HCC)    Thought primarily to be non-systolic although EF down (EF 40-98% 12/2010, down to 35-40% 09/05/11), cath 2008 with no CAD, nuclear study 07/2011 showing Small area of reversibility in the distal ant/lat wall the left ventricle suspicious for ischemia/septal wall HK but felt to be low risk  (per D/C Summary 07/2011)   Chronic bronchitis (HCC)    "get it ~ q yr"   Chronic diastolic heart failure (HCC)         Chronic respiratory failure with hypoxia and hypercapnia (HCC) 09/14/2010   Followed in Pulmonary clinic/ [facility]/ [place]  Started on 02 2lpm at discharge 09/23/11   - PFT's 10/28/2011  FEV1  1.40 (51%)  with ratio 70 and no better p B2 and DLCO 53 corrects to 101    - PFT's 10/18/2014    FEV1 1.71 (59%) with ratio 68 and no sign change p B2 and DLCO 53 corrects to 85  -  HC03   07/28/20  = 33  -  HCO3   03/17/21     = 31     Heart murmur    HLD (hyperlipidemia)    HTN (hypertension)    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   Pleural effusion 2008   S/p decortication   Pneumonia 08/02/2011   Pulmonary HTN (HCC)    per cath 2008   Type II diabetes mellitus (HCC) 1999    Tobacco History: Social History   Tobacco Use  Smoking Status Former   Packs/day: 2.50   Years: 25.00   Total pack years: 62.50   Types: Cigarettes, Cigars   Quit date: 10/11/1980   Years since quitting: 41.8  Smokeless Tobacco Never   Counseling given: Not Answered   Outpatient Medications Prior to Visit  Medication Sig Dispense Refill   acetaminophen (TYLENOL) 500 MG tablet Take 1,000 mg by mouth as needed for headache or  moderate pain.     albuterol (VENTOLIN HFA) 108 (90 Base) MCG/ACT inhaler Inhale 2 puffs into the lungs every 6 (six) hours as needed for wheezing or shortness of breath. (Patient taking differently: Inhale 1 puff into the lungs as needed for wheezing or shortness of breath.) 8.5 g 0   amLODipine (NORVASC) 10 MG tablet Take 1 tablet (10 mg total) by mouth daily. 90 tablet 1   aspirin 81 MG tablet Take 81 mg by mouth every morning.      atorvastatin (LIPITOR) 20 MG tablet Take 1 tablet (20 mg total) by mouth at bedtime. 90 tablet 1   bisoprolol (ZEBETA) 10 MG tablet Take 1 tablet (10 mg total) by mouth daily. 30 tablet 1   budesonide (PULMICORT) 0.5 MG/2ML nebulizer solution Take 2 mLs (0.5 mg total) by nebulization 2 (two) times daily. 360 mL 1   calcium-vitamin D (OSCAL WITH D) 500-200 MG-UNIT tablet Take 1 tablet by mouth every morning.     cholecalciferol (VITAMIN D3) 25 MCG (1000 UNIT) tablet Take 1,000 Units by mouth at bedtime.     diphenhydramine-acetaminophen (TYLENOL PM) 25-500 MG TABS tablet Take 1 tablet by mouth at bedtime as needed.     FARXIGA 10 MG TABS tablet TAKE 1 TABLET DAILY BEFORE BREAKFAST (Patient  taking differently: Take 10 mg by mouth daily.) 90 tablet 3   furosemide (LASIX) 40 MG tablet TAKE 2 TABLETS EVERY MORNING AND 1 TABLET LATE IN THE AFTERNOON (Patient taking differently: Take 40 mg by mouth daily.) 270 tablet 3   ipratropium-albuterol (DUONEB) 0.5-2.5 (3) MG/3ML SOLN Take 3 mLs by nebulization every 6 (six) hours as needed. 360 mL 1   Lancets (ONETOUCH ULTRASOFT) lancets Check blood sugars no more than twice daily 200 each 12   Multiple Vitamins-Minerals (MULTIVITAMINS THER. W/MINERALS) TABS tablet Take 1 tablet by mouth daily. 30 each 5   nitroGLYCERIN (NITROSTAT) 0.4 MG SL tablet Place 1 tablet (0.4 mg total) under the tongue every 5 (five) minutes x 3 doses as needed for chest pain. 25 tablet 3   ONETOUCH VERIO test strip USE TO CHECK BLOOD SUGAR NO MORE THAN TWICE A DAY 200 strip 12   pantoprazole (PROTONIX) 40 MG tablet Take 1 tablet (40 mg total) by mouth daily before breakfast. 90 tablet 1   predniSONE (DELTASONE) 10 MG tablet 4 tabs for 3 days, then 3 tabs for 3 days, 2 tabs for 3 days, then 1 tab for 3 days, then stop 30 tablet 0   tamsulosin (FLOMAX) 0.4 MG CAPS capsule Take 1 capsule (0.4 mg total) by mouth daily after supper. 90 capsule 1   zolpidem (AMBIEN) 10 MG tablet Take 10 mg by mouth at bedtime.     furosemide (LASIX) 40 MG tablet Take 1 tablet (40 mg total) by mouth 2 (two) times daily for 5 days. 10 tablet 0   potassium chloride SA (KLOR-CON M) 20 MEQ tablet Take 1 tablet (20 mEq total) by mouth daily for 5 doses. 5 tablet 0   No facility-administered medications prior to visit.     Review of Systems:   Constitutional: No night sweats, fevers, chills, fatigue, lassitude. +weight gain HEENT: No headaches, difficulty swallowing, tooth/dental problems, or sore throat. No sneezing, itching, ear ache, nasal congestion, or post nasal drip CV:  No chest pain, orthopnea, PND, swelling in lower extremities, anasarca, dizziness, palpitations, syncope Resp:  +shortness of breath with exertion (baseline). No cough. No wheezing. No hemoptysis.  No chest wall deformity GI:  No heartburn, indigestion, abdominal pain, nausea, vomiting, diarrhea, change in bowel habits, loss of appetite, bloody stools.  MSK:  No joint pain or swelling.  No decreased range of motion.  No back pain. Neuro: No dizziness or lightheadedness.  Psych: No depression or anxiety. Mood stable.     Physical Exam:  BP 136/80   Pulse 85   Ht 5\' 9"  (1.753 m)   Wt 207 lb 6.4 oz (94.1 kg)   SpO2 96%   BMI 30.63 kg/m   GEN: Pleasant, interactive, chronically-ill appearing; in no acute distress. HEENT:  Normocephalic and atraumatic. PERRLA. Sclera white. Nasal turbinates pink, moist and patent bilaterally. No rhinorrhea present. Oropharynx pink and moist, without exudate or edema. No lesions, ulcerations, or postnasal drip.  NECK:  Supple w/ fair ROM. No JVD present. Normal carotid impulses w/o bruits. Thyroid symmetrical with no goiter or nodules palpated. No lymphadenopathy.   CV: RRR, no m/r/g, no peripheral edema. Pulses intact, +2 bilaterally. No cyanosis, pallor or clubbing. PULMONARY:  Unlabored, regular breathing. Diminished posterior otherwise clear w/o wheezes/rales/rhonchi. No accessory muscle use.  GI: BS present and normoactive. Soft, non-tender to palpation. No organomegaly or masses detected.  MSK: No erythema, warmth or tenderness. Cap refil <2 sec all extrem. No deformities or joint swelling noted.  Neuro: A/Ox3. No focal deficits noted.   Skin: Warm, no lesions or rashe Psych: Normal affect and behavior. Judgement and thought content appropriate.     Lab Results:  CBC    Component Value Date/Time   WBC 13.4 (H) 07/27/2022 1610   RBC 4.93 07/27/2022 1610   HGB 14.9 07/27/2022 1610   HCT 45.4 07/27/2022 1610   PLT 237.0 07/27/2022 1610   MCV 92.2 07/27/2022 1610   MCH 30.3 07/21/2022 0444   MCHC 32.8 07/27/2022 1610   RDW 14.6 07/27/2022 1610    LYMPHSABS 1.5 07/27/2022 1610   MONOABS 0.9 07/27/2022 1610   EOSABS 0.0 07/27/2022 1610   BASOSABS 0.1 07/27/2022 1610    BMET    Component Value Date/Time   NA 138 07/27/2022 1610   NA 144 12/23/2021 1012   K 5.1 07/27/2022 1610   CL 95 (L) 07/27/2022 1610   CO2 34 (H) 07/27/2022 1610   GLUCOSE 178 (H) 07/27/2022 1610   GLUCOSE 118 (H) 10/18/2006 0000   BUN 16 07/27/2022 1610   BUN 11 12/23/2021 1012   CREATININE 0.76 07/27/2022 1610   CREATININE 0.93 07/28/2020 1026   CALCIUM 8.9 07/27/2022 1610   GFRNONAA >60 07/23/2022 0429   GFRAA >60 09/18/2019 0353    BNP    Component Value Date/Time   BNP 269.7 (H) 07/18/2022 1721     Imaging:  DG Hand Complete Left  Result Date: 07/31/2022 CLINICAL DATA:  Mechanical fall 2 days ago with left hand injury while bracing EXAM: LEFT HAND - COMPLETE 3+ VIEW COMPARISON:  None Available. FINDINGS: Fracture: Oblique fracture of the third metacarpal shaft with 4 mm dorsal and 1 cortical shaft with radial displacement of the distal fracture fragment Healing: None. Soft tissue: Normal. IMPRESSION: Mildly displaced oblique fracture of the third metacarpal shaft. Electronically Signed   By: 08/02/2022 M.D.   On: 07/31/2022 08:21   DG Chest 2 View  Result Date: 07/30/2022 CLINICAL DATA:  COPD EXAM: CHEST - 2 VIEW COMPARISON:  07/19/2022 FINDINGS: Left-sided implanted cardiac device remains in place. Stable cardiomediastinal contours. Aortic atherosclerosis. Coarsened interstitial markings most pronounced within the bibasilar regions. Overall, degree of interstitial prominence has improved from  prior. Small bilateral pleural effusions. No pneumothorax. IMPRESSION: 1. Improving interstitial edema. 2. Small bilateral pleural effusions. Electronically Signed   By: Duanne Guess D.O.   On: 07/30/2022 15:39   CUP PACEART REMOTE DEVICE CHECK  Result Date: 07/29/2022 Scheduled remote reviewed. Normal device function.  The device estimates 3 months  until ERI Next remote 08/30/2022. Hassell Halim, RN, CCDS, [employer]  ECHOCARDIOGRAM COMPLETE  Result Date: 07/21/2022    ECHOCARDIOGRAM REPORT   Patient Name:   JAMARR TREINEN Sr. Date of Exam: 07/21/2022 Medical Rec #:  [id number]         Height:       68.0 in Accession #:    [id number]        Weight:       179.1 lb Date of Birth:  06/13/1941          BSA:          1.950 m Patient Age:    81 years          BP:           134/75 mmHg Patient Gender: M                 HR:           72 bpm. Exam Location:  Inpatient Procedure: 2D Echo Indications:    acute respiratory distress  History:        Patient has prior history of Echocardiogram examinations, most                 recent 07/30/2021. CHF, CAD, COPD, Arrythmias:Atrial                 Fibrillation; Risk Factors:Hypertension and Diabetes.  Sonographer:    Cathie Hoops Referring Phys: [id number] Jeanella Craze  Sonographer Comments: Suboptimal parasternal window and no subcostal window. Image acquisition challenging due to respiratory motion and Image acquisition challenging due to COPD. IMPRESSIONS  1. Left ventricular ejection fraction, by estimation, is 55 to 60%. The left ventricle has normal function. The left ventricle has no regional wall motion abnormalities. There is mild concentric left ventricular hypertrophy. Left ventricular diastolic parameters are indeterminate.  2. Peak RV-RA gradient 43 mmHg. Right ventricular systolic function is moderately reduced. The right ventricular size is mildly enlarged.  3. Left atrial size was mildly dilated.  4. Right atrial size was mildly dilated.  5. The mitral valve is normal in structure. Trivial mitral valve regurgitation. No evidence of mitral stenosis.  6. The aortic valve is tricuspid. There is moderate calcification of the aortic valve. Aortic valve regurgitation is not visualized. Aortic valve sclerosis/calcification is present, without any evidence of aortic stenosis. Aortic valve mean gradient  measures 6.0 mmHg.  7. The IVC was not visualized. FINDINGS  Left Ventricle: Left ventricular ejection fraction, by estimation, is 55 to 60%. The left ventricle has normal function. The left ventricle has no regional wall motion abnormalities. The left ventricular internal cavity size was normal in size. There is  mild concentric left ventricular hypertrophy. Left ventricular diastolic parameters are indeterminate. Right Ventricle: Peak RV-RA gradient 43 mmHg. The right ventricular size is mildly enlarged. No increase in right ventricular wall thickness. Right ventricular systolic function is moderately reduced. Left Atrium: Left atrial size was mildly dilated. Right Atrium: Right atrial size was mildly dilated. Pericardium: There is no evidence of pericardial effusion. Mitral Valve: The mitral valve is normal in structure. There is mild calcification of the mitral  valve leaflet(s). Mild mitral annular calcification. Trivial mitral valve regurgitation. No evidence of mitral valve stenosis. Tricuspid Valve: The tricuspid valve is normal in structure. Tricuspid valve regurgitation is mild. Aortic Valve: The aortic valve is tricuspid. There is moderate calcification of the aortic valve. Aortic valve regurgitation is not visualized. Aortic valve sclerosis/calcification is present, without any evidence of aortic stenosis. Aortic valve mean gradient measures 6.0 mmHg. Aortic valve peak gradient measures 11.3 mmHg. Aortic valve area, by VTI measures 1.94 cm. Pulmonic Valve: The pulmonic valve was normal in structure. Pulmonic valve regurgitation is not visualized. Aorta: The aortic root is normal in size and structure. IAS/Shunts: No atrial level shunt detected by color flow Doppler.  LEFT VENTRICLE PLAX 2D LVIDd:         4.00 cm     Diastology LVIDs:         2.40 cm     LV e' medial:    7.18 cm/s LV PW:         1.10 cm     LV E/e' medial:  14.1 LV IVS:        1.10 cm     LV e' lateral:   9.14 cm/s LVOT diam:     2.00 cm      LV E/e' lateral: 11.1 LV SV:         61 LV SV Index:   31 LVOT Area:     3.14 cm  LV Volumes (MOD) LV vol d, MOD A2C: 86.7 ml LV vol d, MOD A4C: 76.8 ml LV vol s, MOD A2C: 38.7 ml LV vol s, MOD A4C: 32.6 ml LV SV MOD A2C:     48.0 ml LV SV MOD A4C:     76.8 ml LV SV MOD BP:      48.6 ml RIGHT VENTRICLE RV S prime:     10.10 cm/s TAPSE (M-mode): 1.5 cm LEFT ATRIUM             Index        RIGHT ATRIUM           Index LA diam:        3.80 cm 1.95 cm/m   RA Area:     23.50 cm LA Vol (A2C):   59.9 ml 30.72 ml/m  RA Volume:   68.50 ml  35.13 ml/m LA Vol (A4C):   55.9 ml 28.67 ml/m LA Biplane Vol: 58.2 ml 29.84 ml/m  AORTIC VALVE AV Area (Vmax):    2.08 cm AV Area (Vmean):   1.87 cm AV Area (VTI):     1.94 cm AV Vmax:           168.00 cm/s AV Vmean:          110.000 cm/s AV VTI:            0.316 m AV Peak Grad:      11.3 mmHg AV Mean Grad:      6.0 mmHg LVOT Vmax:         111.00 cm/s LVOT Vmean:        65.600 cm/s LVOT VTI:          0.195 m LVOT/AV VTI ratio: 0.62  AORTA Ao Root diam: 3.20 cm Ao Asc diam:  3.10 cm MITRAL VALVE                TRICUSPID VALVE MV Area (PHT): 4.21 cm     TR Peak grad:   43.6 mmHg MV Decel Time: 180  msec     TR Vmax:        330.00 cm/s MR Peak grad: 33.4 mmHg MR Vmax:      289.00 cm/s   SHUNTS MV E velocity: 101.00 cm/s  Systemic VTI:  0.20 m MV A velocity: 28.30 cm/s   Systemic Diam: 2.00 cm MV E/A ratio:  3.57 Dalton McleanMD Electronically signed by Wilfred Lacy Signature Date/Time: 07/21/2022/3:32:33 PM    Final    VAS [place] LOWER EXTREMITY VENOUS (DVT)  Result Date: 07/20/2022  Lower Venous DVT Study Patient Name:  ADRIC REVIER Sr.  Date of Exam:   07/20/2022 Medical Rec #: [id number]          Accession #:    [id number] Date of Birth: 01-Aug-1941           Patient Gender: M Patient Age:   39 years Exam Location:  [facility] Procedure:      VAS [place] LOWER EXTREMITY VENOUS (DVT) Referring Phys: Canary Brim  --------------------------------------------------------------------------------  Indications: Swelling.  Risk Factors: None identified. Comparison Study: No prior studies. Performing Technologist: Chanda Busing RVT  Examination Guidelines: A complete evaluation includes B-mode imaging, spectral Doppler, color Doppler, and power Doppler as needed of all accessible portions of each vessel. Bilateral testing is considered an integral part of a complete examination. Limited examinations for reoccurring indications may be performed as noted. The reflux portion of the exam is performed with the patient in reverse Trendelenburg.  +---------+---------------+---------+-----------+----------+--------------+ RIGHT    CompressibilityPhasicitySpontaneityPropertiesThrombus Aging +---------+---------------+---------+-----------+----------+--------------+ CFV      Full           Yes      Yes                                 +---------+---------------+---------+-----------+----------+--------------+ SFJ      Full                                                        +---------+---------------+---------+-----------+----------+--------------+ FV Prox  Full                                                        +---------+---------------+---------+-----------+----------+--------------+ FV Mid   Full                                                        +---------+---------------+---------+-----------+----------+--------------+ FV DistalFull                                                        +---------+---------------+---------+-----------+----------+--------------+ PFV      Full                                                        +---------+---------------+---------+-----------+----------+--------------+  POP      Full           Yes      Yes                                 +---------+---------------+---------+-----------+----------+--------------+ PTV      Full                                                         +---------+---------------+---------+-----------+----------+--------------+ PERO     Full                                                        +---------+---------------+---------+-----------+----------+--------------+   +---------+---------------+---------+-----------+----------+--------------+ LEFT     CompressibilityPhasicitySpontaneityPropertiesThrombus Aging +---------+---------------+---------+-----------+----------+--------------+ CFV      Full           Yes      Yes                                 +---------+---------------+---------+-----------+----------+--------------+ SFJ      Full                                                        +---------+---------------+---------+-----------+----------+--------------+ FV Prox  Full                                                        +---------+---------------+---------+-----------+----------+--------------+ FV Mid   Full                                                        +---------+---------------+---------+-----------+----------+--------------+ FV DistalFull                                                        +---------+---------------+---------+-----------+----------+--------------+ PFV      Full                                                        +---------+---------------+---------+-----------+----------+--------------+ POP      Full           Yes      Yes                                 +---------+---------------+---------+-----------+----------+--------------+  PTV      Full                                                        +---------+---------------+---------+-----------+----------+--------------+ PERO     Full                                                        +---------+---------------+---------+-----------+----------+--------------+     Summary: RIGHT: - There is no evidence of deep vein thrombosis in  the lower extremity.  - No cystic structure found in the popliteal fossa.  LEFT: - There is no evidence of deep vein thrombosis in the lower extremity.  - No cystic structure found in the popliteal fossa.  *See table(s) above for measurements and observations. Electronically signed by Waverly Ferrari MD on 07/20/2022 at 6:18:54 PM.    Final    DG CHEST PORT 1 VIEW  Result Date: 07/19/2022 CLINICAL DATA:  Shortness of breath EXAM: PORTABLE CHEST 1 VIEW COMPARISON:  07/18/2022 FINDINGS: Left chest cardiac device with unchanged lead position. Unchanged cardiomegaly. Aortic atherosclerosis. Diffuse interstitial opacities, likely pulmonary edema. No definite pleural. No pneumothorax. No acute osseous abnormality. IMPRESSION: Cardiomegaly and pulmonary edema. Electronically Signed   By: Wiliam Ke M.D.   On: 07/19/2022 12:48   DG Chest Port 1 View  Result Date: 07/18/2022 CLINICAL DATA:  Cough and shortness of breath. Respiratory distress. EXAM: PORTABLE CHEST 1 VIEW COMPARISON:  Two-view chest x-ray 11/11/2021 FINDINGS: Heart is enlarged. Pacing wires are in place. Atherosclerotic calcifications are present in the aortic arch. Diffuse interstitial pattern is increased, suggesting edema superimposed on chronic interstitial changes. No focal airspace consolidation is present. No definite effusions are present. IMPRESSION: 1. Cardiomegaly and increased diffuse interstitial pattern compatible with congestive heart failure. 2. Aortic atherosclerosis. Electronically Signed   By: Marin Roberts M.D.   On: 07/18/2022 17:45   CUP PACEART INCLINIC DEVICE CHECK  Result Date: 07/16/2022 CRT-P device check in clinic. Normal device function. Thresholds, sensing, impedance consistent with previous measurements. Histograms appropriate for patient and level of activity. Brief known ventricular high rate episodes. Patient bi-ventricularly pacing 98.8% of the time. Device programmed with appropriate safety margins.  Device heart failure diagnostics are within normal limits and stable over time. Estimated longevity 3 months. Patient enrolled in monthly remote follow-up. No atrial lead.Ancil Boozer, BSN, RN        Latest Ref Rng & Units 09/18/2020    8:34 AM 10/18/2014    1:02 PM  PFT Results  FVC-Pre L 1.83  2.49  P  FVC-Predicted Pre % 49  63  P  FVC-Post L 1.89  2.32  P  FVC-Predicted Post % 50  58  P  Pre FEV1/FVC % % 73  68  P  Post FEV1/FCV % % 75  72  P  FEV1-Pre L 1.33  1.71  P  FEV1-Predicted Pre % 50  59  P  FEV1-Post L 1.41  1.68  P  DLCO uncorrected ml/min/mmHg 11.58  15.84  P  DLCO UNC% % 50  53  P  DLCO corrected ml/min/mmHg 11.58    DLCO COR %Predicted % 50  DLVA Predicted % 119  85  P  TLC L 4.82  5.03  P  TLC % Predicted % 72  75  P  RV % Predicted % 117  107  P    P Preliminary result    No results found for: "NITRICOXIDE"      Assessment & Plan:   COPD exacerbation (HCC) Resolving AECOPD. He is clinically improved with one day left of prednisone. Maintained on triple therapy nebs with duoneb and budesonide.   Patient Instructions  Continue ipratropium-albuterol 3 mL neb every 6 hours Continue budesonide 2 mL neb twice daily.  Brush tongue and rinse mouth afterwards Continue supplemental oxygen 4 L/min for goal greater than 88 to 90% Continue Mucinex 600 to 1200 mg twice daily Continue pantoprazole 1 tab daily Finish prednisone taper   Check BMET today - may increase lasix back to twice daily depending on results  Contact cardiology for hospital follow up and to discuss weight gain   Follow up end of December (after 12/25) with [name] to review CT scan results. If symptoms do not improve or worsen, please contact office for sooner follow up or seek emergency care.     Chronic respiratory failure with hypoxia and hypercapnia (HCC) Stable without increased O2 requirement. Continue 4 lpm for goal >88-90%  ILD (interstitial lung disease) (HCC) Groundglass  opacities on imaging; possible underlying ILD. HRCT chest and PFTs were ordered for further evaluation. CT is scheduled for December. Will follow up on scheduling PFTs.   Acute on chronic diastolic heart failure (HCC) He's had weight gain since he was here last of around 20 lb. Appears that his weight was up to 208 lb at OV with sports medicine on 10/24. He's down to 203 lb today. He completed twice daily dosing of lasix and has returned to 40 mg daily. Oddly, he appears euvolemic on exam without BLE edema and his respiratory symptoms have improved. His lung exam was also improved. We will recheck BMET today. Message sent to cardiology. Advised him to monitor weights at home; 2-3 lb overnight or 5 lb in a week.    I spent 31 minutes of dedicated to the care of this patient on the date of this encounter to include pre-visit review of records, face-to-face time with the patient discussing conditions above, post visit ordering of testing, clinical documentation with the electronic health record, making appropriate referrals as documented, and communicating necessary findings to members of the patients care team.  Noemi Chapel, NP 08/09/2022  Pt aware and understands NP's role.

## 2022-08-09 NOTE — Assessment & Plan Note (Addendum)
Resolving AECOPD. He is clinically improved with one day left of prednisone. Maintained on triple therapy nebs with duoneb and budesonide.   Patient Instructions  Continue ipratropium-albuterol 3 mL neb every 6 hours Continue budesonide 2 mL neb twice daily.  Brush tongue and rinse mouth afterwards Continue supplemental oxygen 4 L/min for goal greater than 88 to 90% Continue Mucinex 600 to 1200 mg twice daily Continue pantoprazole 1 tab daily Finish prednisone taper   Check BMET today - may increase lasix back to twice daily depending on results  Contact cardiology for hospital follow up and to discuss weight gain   Follow up end of December (after 12/25) with Bradley [name] to review CT scan results. If symptoms do not improve or worsen, please contact office for sooner follow up or seek emergency care.

## 2022-08-09 NOTE — Assessment & Plan Note (Signed)
Groundglass opacities on imaging; possible underlying ILD. HRCT chest and PFTs were ordered for further evaluation. CT is scheduled for December. Will follow up on scheduling PFTs.

## 2022-08-10 NOTE — Progress Notes (Signed)
Please notify patient that he needs to increase his lasix to twice daily dosing. I want him to take 40 mg in the AM and then 20 mg in the PM. He needs to monitor his weights and notify cardiology if he has 2-3 lb weight gain overnight or 5 lb in a week. I spoke with Dr. Lovena Le (his cardiologist) so he is aware of the change in lasix dosing. Please instruct patient to contact their office to schedule follow up. He also needs to come back to the office just for a lab draw in one week. Thanks!

## 2022-08-23 ENCOUNTER — Emergency Department (HOSPITAL_COMMUNITY): Payer: Medicare Other

## 2022-08-23 ENCOUNTER — Inpatient Hospital Stay (HOSPITAL_COMMUNITY)
Admission: EM | Admit: 2022-08-23 | Discharge: 2022-08-25 | DRG: 177 | Disposition: A | Discharge status: Home / Self Care | Payer: Medicare Other | Attending: Internal Medicine | Admitting: Internal Medicine

## 2022-08-23 ENCOUNTER — Encounter (HOSPITAL_COMMUNITY): Payer: Self-pay | Admitting: Emergency Medicine

## 2022-08-23 ENCOUNTER — Other Ambulatory Visit: Payer: Self-pay

## 2022-08-23 DIAGNOSIS — Z87891 Personal history of nicotine dependence: Secondary | ICD-10-CM | POA: Diagnosis not present

## 2022-08-23 DIAGNOSIS — S62303A Unspecified fracture of third metacarpal bone, left hand, initial encounter for closed fracture: Secondary | ICD-10-CM | POA: Diagnosis present

## 2022-08-23 DIAGNOSIS — E1169 Type 2 diabetes mellitus with other specified complication: Secondary | ICD-10-CM

## 2022-08-23 DIAGNOSIS — I5032 Chronic diastolic (congestive) heart failure: Secondary | ICD-10-CM | POA: Diagnosis present

## 2022-08-23 DIAGNOSIS — I480 Paroxysmal atrial fibrillation: Secondary | ICD-10-CM | POA: Diagnosis present

## 2022-08-23 DIAGNOSIS — I272 Pulmonary hypertension, unspecified: Secondary | ICD-10-CM | POA: Diagnosis present

## 2022-08-23 DIAGNOSIS — N400 Enlarged prostate without lower urinary tract symptoms: Secondary | ICD-10-CM | POA: Diagnosis present

## 2022-08-23 DIAGNOSIS — E1151 Type 2 diabetes mellitus with diabetic peripheral angiopathy without gangrene: Secondary | ICD-10-CM | POA: Diagnosis present

## 2022-08-23 DIAGNOSIS — I2510 Atherosclerotic heart disease of native coronary artery without angina pectoris: Secondary | ICD-10-CM | POA: Diagnosis present

## 2022-08-23 DIAGNOSIS — Z8673 Personal history of transient ischemic attack (TIA), and cerebral infarction without residual deficits: Secondary | ICD-10-CM

## 2022-08-23 DIAGNOSIS — R0902 Hypoxemia: Secondary | ICD-10-CM | POA: Diagnosis present

## 2022-08-23 DIAGNOSIS — Z885 Allergy status to narcotic agent status: Secondary | ICD-10-CM | POA: Diagnosis not present

## 2022-08-23 DIAGNOSIS — J9622 Acute and chronic respiratory failure with hypercapnia: Secondary | ICD-10-CM | POA: Diagnosis present

## 2022-08-23 DIAGNOSIS — U071 COVID-19: Secondary | ICD-10-CM | POA: Diagnosis present

## 2022-08-23 DIAGNOSIS — W1830XA Fall on same level, unspecified, initial encounter: Secondary | ICD-10-CM | POA: Diagnosis present

## 2022-08-23 DIAGNOSIS — Z7982 Long term (current) use of aspirin: Secondary | ICD-10-CM

## 2022-08-23 DIAGNOSIS — Z886 Allergy status to analgesic agent status: Secondary | ICD-10-CM | POA: Diagnosis not present

## 2022-08-23 DIAGNOSIS — G4700 Insomnia, unspecified: Secondary | ICD-10-CM | POA: Diagnosis present

## 2022-08-23 DIAGNOSIS — Z9981 Dependence on supplemental oxygen: Secondary | ICD-10-CM | POA: Diagnosis not present

## 2022-08-23 DIAGNOSIS — J441 Chronic obstructive pulmonary disease with (acute) exacerbation: Secondary | ICD-10-CM | POA: Diagnosis present

## 2022-08-23 DIAGNOSIS — Z888 Allergy status to other drugs, medicaments and biological substances status: Secondary | ICD-10-CM | POA: Diagnosis not present

## 2022-08-23 DIAGNOSIS — Z79899 Other long term (current) drug therapy: Secondary | ICD-10-CM

## 2022-08-23 DIAGNOSIS — Z7951 Long term (current) use of inhaled steroids: Secondary | ICD-10-CM

## 2022-08-23 DIAGNOSIS — E785 Hyperlipidemia, unspecified: Secondary | ICD-10-CM | POA: Diagnosis present

## 2022-08-23 DIAGNOSIS — I428 Other cardiomyopathies: Secondary | ICD-10-CM | POA: Diagnosis present

## 2022-08-23 DIAGNOSIS — J9621 Acute and chronic respiratory failure with hypoxia: Secondary | ICD-10-CM | POA: Diagnosis present

## 2022-08-23 DIAGNOSIS — Z833 Family history of diabetes mellitus: Secondary | ICD-10-CM | POA: Diagnosis not present

## 2022-08-23 DIAGNOSIS — Z881 Allergy status to other antibiotic agents status: Secondary | ICD-10-CM

## 2022-08-23 DIAGNOSIS — Z950 Presence of cardiac pacemaker: Secondary | ICD-10-CM | POA: Diagnosis not present

## 2022-08-23 DIAGNOSIS — I110 Hypertensive heart disease with heart failure: Secondary | ICD-10-CM | POA: Diagnosis present

## 2022-08-23 DIAGNOSIS — Z8249 Family history of ischemic heart disease and other diseases of the circulatory system: Secondary | ICD-10-CM | POA: Diagnosis not present

## 2022-08-23 DIAGNOSIS — Z7984 Long term (current) use of oral hypoglycemic drugs: Secondary | ICD-10-CM

## 2022-08-23 LAB — CBC WITH DIFFERENTIAL/PLATELET
Abs Immature Granulocytes: 0.03 10*3/uL (ref 0.00–0.07)
Basophils Absolute: 0 10*3/uL (ref 0.0–0.1)
Basophils Relative: 0 %
Eosinophils Absolute: 0 10*3/uL (ref 0.0–0.5)
Eosinophils Relative: 0 %
HCT: 38.4 % — ABNORMAL LOW (ref 39.0–52.0)
Hemoglobin: 13.2 g/dL (ref 13.0–17.0)
Immature Granulocytes: 1 %
Lymphocytes Relative: 21 %
Lymphs Abs: 1 10*3/uL (ref 0.7–4.0)
MCH: 31.9 pg (ref 26.0–34.0)
MCHC: 34.4 g/dL (ref 30.0–36.0)
MCV: 92.8 fL (ref 80.0–100.0)
Monocytes Absolute: 0.7 10*3/uL (ref 0.1–1.0)
Monocytes Relative: 16 %
Neutro Abs: 2.8 10*3/uL (ref 1.7–7.7)
Neutrophils Relative %: 62 %
Platelets: 117 10*3/uL — ABNORMAL LOW (ref 150–400)
RBC: 4.14 MIL/uL — ABNORMAL LOW (ref 4.22–5.81)
RDW: 14.9 % (ref 11.5–15.5)
WBC: 4.5 10*3/uL (ref 4.0–10.5)
nRBC: 0 % (ref 0.0–0.2)

## 2022-08-23 LAB — BASIC METABOLIC PANEL
Anion gap: 10 (ref 5–15)
BUN: 6 mg/dL — ABNORMAL LOW (ref 8–23)
CO2: 29 mmol/L (ref 22–32)
Calcium: 8.5 mg/dL — ABNORMAL LOW (ref 8.9–10.3)
Chloride: 100 mmol/L (ref 98–111)
Creatinine, Ser: 0.81 mg/dL (ref 0.61–1.24)
GFR, Estimated: >60 mL/min (ref >60)
Glucose, Bld: 128 mg/dL — ABNORMAL HIGH (ref 70–99)
Potassium: 4.1 mmol/L (ref 3.5–5.1)
Sodium: 139 mmol/L (ref 135–145)

## 2022-08-23 LAB — FERRITIN: Ferritin: 232 ng/mL (ref 24–336)

## 2022-08-23 LAB — PROCALCITONIN: Procalcitonin: <0.1 ng/mL

## 2022-08-23 LAB — TROPONIN I (HIGH SENSITIVITY)
Troponin I (High Sensitivity): 21 ng/L — ABNORMAL HIGH (ref <18)
Troponin I (High Sensitivity): 20 ng/L — ABNORMAL HIGH (ref <18)

## 2022-08-23 LAB — D-DIMER, QUANTITATIVE: D-Dimer, Quant: 0.76 ug/mL-FEU — ABNORMAL HIGH (ref 0.00–0.50)

## 2022-08-23 LAB — LACTATE DEHYDROGENASE: LDH: 171 U/L (ref 98–192)

## 2022-08-23 LAB — RESP PANEL BY RT-PCR (FLU A&B, COVID) ARPGX2
Influenza A by PCR: NEGATIVE
Influenza B by PCR: NEGATIVE
SARS Coronavirus 2 by RT PCR: POSITIVE — AB

## 2022-08-23 LAB — BRAIN NATRIURETIC PEPTIDE: B Natriuretic Peptide: 506.4 pg/mL — ABNORMAL HIGH (ref 0.0–100.0)

## 2022-08-23 LAB — FIBRINOGEN: Fibrinogen: 531 mg/dL — ABNORMAL HIGH (ref 210–475)

## 2022-08-23 MED ORDER — ENOXAPARIN SODIUM 40 MG/0.4ML IJ SOSY
40.0000 mg | PREFILLED_SYRINGE | INTRAMUSCULAR | Status: DC
  Administered 2022-08-23 – 2022-08-24 (×2): 40 mg via SUBCUTANEOUS
  Filled 2022-08-23 (×2): qty 0.4

## 2022-08-23 MED ORDER — DEXAMETHASONE SODIUM PHOSPHATE 10 MG/ML IJ SOLN
10.0000 mg | Freq: Once | INTRAMUSCULAR | Status: AC
  Administered 2022-08-23: 10 mg via INTRAVENOUS
  Filled 2022-08-23: qty 1

## 2022-08-23 MED ORDER — PREDNISONE 20 MG PO TABS: 50.0000 mg | ORAL_TABLET | Freq: Every day | ORAL | Status: DC

## 2022-08-23 MED ORDER — AMLODIPINE BESYLATE 10 MG PO TABS
10.0000 mg | ORAL_TABLET | Freq: Every day | ORAL | Status: DC
  Administered 2022-08-23 – 2022-08-25 (×3): 10 mg via ORAL
  Filled 2022-08-23: qty 2
  Filled 2022-08-23 (×2): qty 1

## 2022-08-23 MED ORDER — ASPIRIN 81 MG PO TBEC
81.0000 mg | DELAYED_RELEASE_TABLET | Freq: Every morning | ORAL | Status: DC
  Administered 2022-08-23 – 2022-08-25 (×3): 81 mg via ORAL
  Filled 2022-08-23 (×3): qty 1

## 2022-08-23 MED ORDER — ATORVASTATIN CALCIUM 10 MG PO TABS
20.0000 mg | ORAL_TABLET | Freq: Every day | ORAL | Status: DC
  Administered 2022-08-23 – 2022-08-24 (×2): 20 mg via ORAL
  Filled 2022-08-23 (×2): qty 2

## 2022-08-23 MED ORDER — BUDESONIDE 0.5 MG/2ML IN SUSP
0.5000 mg | Freq: Two times a day (BID) | RESPIRATORY_TRACT | Status: DC
  Administered 2022-08-23 – 2022-08-25 (×5): 0.5 mg via RESPIRATORY_TRACT
  Filled 2022-08-23 (×7): qty 2

## 2022-08-23 MED ORDER — BISOPROLOL FUMARATE 10 MG PO TABS
10.0000 mg | ORAL_TABLET | Freq: Every day | ORAL | Status: DC
  Administered 2022-08-23 – 2022-08-25 (×3): 10 mg via ORAL
  Filled 2022-08-23 (×3): qty 1

## 2022-08-23 MED ORDER — ALBUTEROL SULFATE HFA 108 (90 BASE) MCG/ACT IN AERS: 1.0000 | INHALATION_SPRAY | RESPIRATORY_TRACT | Status: DC | PRN

## 2022-08-23 MED ORDER — IPRATROPIUM-ALBUTEROL 0.5-2.5 (3) MG/3ML IN SOLN: 3.0000 mL | Freq: Four times a day (QID) | RESPIRATORY_TRACT | Status: DC | PRN

## 2022-08-23 MED ORDER — FUROSEMIDE 20 MG PO TABS: 40.0000 mg | ORAL_TABLET | Freq: Every day | ORAL | Status: DC

## 2022-08-23 MED ORDER — TAMSULOSIN HCL 0.4 MG PO CAPS
0.4000 mg | ORAL_CAPSULE | Freq: Every day | ORAL | Status: DC
  Administered 2022-08-23 – 2022-08-24 (×2): 0.4 mg via ORAL
  Filled 2022-08-23 (×2): qty 1

## 2022-08-23 MED ORDER — IPRATROPIUM-ALBUTEROL 20-100 MCG/ACT IN AERS
1.0000 | INHALATION_SPRAY | Freq: Four times a day (QID) | RESPIRATORY_TRACT | Status: DC
  Administered 2022-08-23 – 2022-08-24 (×4): 1 via RESPIRATORY_TRACT
  Filled 2022-08-23 (×2): qty 4

## 2022-08-23 MED ORDER — NITROGLYCERIN 0.4 MG SL SUBL: 0.4000 mg | SUBLINGUAL_TABLET | SUBLINGUAL | Status: DC | PRN

## 2022-08-23 MED ORDER — SENNOSIDES-DOCUSATE SODIUM 8.6-50 MG PO TABS: 1.0000 | ORAL_TABLET | Freq: Every evening | ORAL | Status: DC | PRN

## 2022-08-23 MED ORDER — PANTOPRAZOLE SODIUM 40 MG PO TBEC
40.0000 mg | DELAYED_RELEASE_TABLET | Freq: Every day | ORAL | Status: DC
  Administered 2022-08-24 – 2022-08-25 (×2): 40 mg via ORAL
  Filled 2022-08-23 (×2): qty 1

## 2022-08-23 MED ORDER — METHYLPREDNISOLONE SODIUM SUCC 125 MG IJ SOLR
95.0000 mg | INTRAMUSCULAR | Status: DC
  Administered 2022-08-24 – 2022-08-25 (×2): 95 mg via INTRAVENOUS
  Filled 2022-08-23 (×2): qty 2

## 2022-08-23 MED ORDER — DAPAGLIFLOZIN PROPANEDIOL 10 MG PO TABS
10.0000 mg | ORAL_TABLET | Freq: Every day | ORAL | Status: DC
  Administered 2022-08-23 – 2022-08-25 (×3): 10 mg via ORAL
  Filled 2022-08-23 (×3): qty 1

## 2022-08-23 MED ORDER — ALBUTEROL SULFATE (2.5 MG/3ML) 0.083% IN NEBU: 2.5000 mg | INHALATION_SOLUTION | RESPIRATORY_TRACT | Status: DC | PRN

## 2022-08-23 MED ORDER — FUROSEMIDE 10 MG/ML IJ SOLN
40.0000 mg | Freq: Once | INTRAMUSCULAR | Status: AC
  Administered 2022-08-23: 40 mg via INTRAVENOUS
  Filled 2022-08-23: qty 4

## 2022-08-23 MED ORDER — DIPHENHYDRAMINE-APAP (SLEEP) 25-500 MG PO TABS: 1.0000 | ORAL_TABLET | Freq: Every evening | ORAL | Status: DC | PRN

## 2022-08-23 NOTE — ED Notes (Signed)
Patient states he is having more trouble breathing. Triage RN Reita Cliche notified

## 2022-08-23 NOTE — ED Provider Triage Note (Signed)
Emergency Medicine Provider Triage Evaluation Note  Bradley Searles Sr. , a 81 y.o. male  was evaluated in triage.  Pt complains of SOB.  Wears chronic 4L O2.  Hx of COPD, CHF.  States worsening over the past few days.  He does feel some chest congestion, subjective fever/chills.  Tylenol given by EMS.  Denies sick contacts or known covid/flu exposure. Hx of frequent CAP.  Review of Systems  Positive: SOB Negative: Chest pain  Physical Exam  BP 126/68   Pulse 72   Temp 98.3 F (36.8 C)   Resp 18   SpO2 94%  Gen:   Awake, no distress   Resp:  Normal effort  MSK:   Moves extremities without difficulty  Other:  Chronic 4L O2 in use, coarse breath sounds, able to carry on fluid conversation, sats 94-95% on 4L  Medical Decision Making  Medically screening exam initiated at 5:19 AM.  Appropriate orders placed.  Bradley Searles Sr. was informed that the remainder of the evaluation will be completed by another provider, this initial triage assessment does not replace that evaluation, and the importance of remaining in the ED until their evaluation is complete.  SOB.  Hx of COPD, CHF, multiple bouts of CAP.  EKG, labs, CXR, covid/flu screen.   Garlon Hatchet, PA-C 08/23/22 [phone]

## 2022-08-23 NOTE — ED Notes (Signed)
Patient placed in appropriate seating area 

## 2022-08-23 NOTE — ED Provider Notes (Signed)
[facility] EMERGENCY DEPARTMENT Provider Note   CSN: [id number] Arrival date & time: 08/23/22  0514     History  Chief Complaint  Patient presents with   Shortness of Breath    4 LPM/[place] 24/7    Bradley GRISSON Sr. is a 81 y.o. male.   Shortness of Breath Associated symptoms: cough      81 year old male, former smoker followed for COPD, cor pulmonale, and chronic respiratory failure on supplemental O2, HTN, CHF, non-ischemic cardiomyopathy, sinoatrial node dysfunction s/p pacemaker, CAD, GERD, DM, HLD, anxiety who presents with 3-4 days of worsening shortness of breath.   The patient states that he has had nasal congestion with dry cough.  He denies any chest pain.  He states that he has had worsening dyspnea on exertion.  No positional component to it.  He denies any lower extremity swelling.  Returned mildly productive.  He denies any fevers, endorsed chills at home. No sick contacts. He is chronically on 4L O2 via nasal cannula.   Home Medications Prior to Admission medications   Medication Sig Start Date End Date Taking? Authorizing Provider  acetaminophen (TYLENOL) 500 MG tablet Take 1,000 mg by mouth as needed for headache or moderate pain.   Yes [provider]  albuterol (VENTOLIN HFA) 108 (90 Base) MCG/ACT inhaler Inhale 2 puffs into the lungs every 6 (six) hours as needed for wheezing or shortness of breath. Patient taking differently: Inhale 1 puff into the lungs 2 (two) times daily. 03/11/21  Yes Ghimire, Werner Lean, MD  amLODipine (NORVASC) 10 MG tablet Take 1 tablet (10 mg total) by mouth daily. Patient taking differently: Take 10 mg by mouth in the morning. 06/07/22  Yes [name], MD  aspirin 81 MG tablet Take 81 mg by mouth every morning.    Yes [provider]  atorvastatin (LIPITOR) 20 MG tablet Take 1 tablet (20 mg total) by mouth at bedtime. Patient taking differently: Take 20 mg by mouth every evening. 06/07/22  Yes Paz, Nolon Rod, MD  bisoprolol (ZEBETA) 10 MG tablet Take 1 tablet (10 mg total) by mouth daily. Patient taking differently: Take 10 mg by mouth in the morning. 07/23/22  Yes Cipriano Bunker, MD  budesonide (PULMICORT) 0.5 MG/2ML nebulizer solution Take 2 mLs (0.5 mg total) by nebulization 2 (two) times daily. 08/02/22  Yes Paz, Nolon Rod, MD  calcium-vitamin D (OSCAL WITH D) 500-200 MG-UNIT tablet Take 1 tablet by mouth every morning.   Yes [provider]  cholecalciferol (VITAMIN D3) 25 MCG (1000 UNIT) tablet Take 1,000 Units by mouth every evening.   Yes [provider]  diphenhydramine-acetaminophen (TYLENOL PM) 25-500 MG TABS tablet Take 1 tablet by mouth at bedtime as needed (sleep).   Yes [provider]  FARXIGA 10 MG TABS tablet TAKE 1 TABLET DAILY BEFORE BREAKFAST Patient taking differently: Take 10 mg by mouth in the morning. 03/29/22  Yes Paz, Nolon Rod, MD  furosemide (LASIX) 40 MG tablet TAKE 2 TABLETS EVERY MORNING AND 1 TABLET LATE IN THE AFTERNOON Patient taking differently: Take 40 mg by mouth in the morning. 02/26/22  Yes [name], MD  ipratropium-albuterol (DUONEB) 0.5-2.5 (3) MG/3ML SOLN Take 3 mLs by nebulization every 6 (six) hours as needed. Patient taking differently: Take 3 mLs by nebulization every 6 (six) hours as needed (wheezing, shortness of breath). 08/02/22  Yes [name], MD  Multiple Vitamins-Minerals (MULTIVITAMINS THER. W/MINERALS) TABS tablet Take 1 tablet by  mouth daily. Patient taking differently: Take 1 tablet by mouth in the morning. 09/14/13  Yes Paz, Nolon Rod, MD  nitroGLYCERIN (NITROSTAT) 0.4 MG SL tablet Place 1 tablet (0.4 mg total) under the tongue every 5 (five) minutes x 3 doses as needed for chest pain. 09/07/21  Yes [name], MD  pantoprazole (PROTONIX) 40 MG tablet Take 1 tablet (40 mg total) by mouth daily before breakfast. 03/05/22  Yes [name], MD  tamsulosin (FLOMAX) 0.4 MG CAPS capsule Take 1 capsule (0.4 mg total) by  mouth daily after supper. Patient taking differently: Take 0.4 mg by mouth in the morning. 05/27/22  Yes Paz, Nolon Rod, MD  zolpidem (AMBIEN) 10 MG tablet Take 10 mg by mouth at bedtime as needed for sleep.   Yes [provider]  Lancets Letta Pate ULTRASOFT) lancets Check blood sugars no more than twice daily 03/08/17   [name], MD  [facility] VERIO test strip USE TO CHECK BLOOD SUGAR NO MORE THAN TWICE A DAY 02/09/21   [name], MD  potassium chloride SA (KLOR-CON M) 20 MEQ tablet Take 1 tablet (20 mEq total) by mouth daily for 5 doses. Patient not taking: Reported on 08/23/2022 07/30/22 10/16/22  Noemi Chapel, NP  predniSONE (DELTASONE) 10 MG tablet 4 tabs for 3 days, then 3 tabs for 3 days, 2 tabs for 3 days, then 1 tab for 3 days, then stop Patient not taking: Reported on 08/23/2022 07/30/22   Noemi Chapel, NP      Allergies    Hydrocodone, Ultram [tramadol], Cialis [tadalafil], and Avelox [moxifloxacin hydrochloride]    Review of Systems   Review of Systems  Respiratory:  Positive for cough and shortness of breath.   All other systems reviewed and are negative.   Physical Exam Updated Vital Signs BP 115/68   Pulse 69   Temp (!) 97.5 F (36.4 C) (Oral)   Resp (!) 27   SpO2 95%  Physical Exam Vitals and nursing note reviewed.  Constitutional:      General: He is not in acute distress. HENT:     Head: Normocephalic and atraumatic.  Eyes:     Conjunctiva/sclera: Conjunctivae normal.     Pupils: Pupils are equal, round, and reactive to light.  Cardiovascular:     Rate and Rhythm: Normal rate and regular rhythm.  Pulmonary:     Effort: Pulmonary effort is normal. Tachypnea present. No respiratory distress.     Breath sounds: No wheezing, rhonchi or rales.     Comments: On baseline 4L O2 Abdominal:     General: There is no distension.     Tenderness: There is no guarding.  Musculoskeletal:        General: No deformity or signs of injury.     Cervical  back: Neck supple.  Skin:    Findings: No lesion or rash.  Neurological:     General: No focal deficit present.     Mental Status: He is alert. Mental status is at baseline.     ED Results / Procedures / Treatments   Labs (all labs ordered are listed, but only abnormal results are displayed) Labs Reviewed  RESP PANEL BY RT-PCR (FLU A&B, COVID) ARPGX2 - Abnormal; Notable for the following components:      Result Value   SARS Coronavirus 2 by RT PCR POSITIVE (*)    All other components within normal limits  CBC WITH DIFFERENTIAL/PLATELET - Abnormal; Notable for the following components:  RBC 4.14 (*)    HCT 38.4 (*)    Platelets 117 (*)    All other components within normal limits  BASIC METABOLIC PANEL - Abnormal; Notable for the following components:   Glucose, Bld 128 (*)    BUN 6 (*)    Calcium 8.5 (*)    All other components within normal limits  BRAIN NATRIURETIC PEPTIDE - Abnormal; Notable for the following components:   B Natriuretic Peptide 506.4 (*)    All other components within normal limits  D-DIMER, QUANTITATIVE (NOT AT [facility]) - Abnormal; Notable for the following components:   D-Dimer, Quant 0.76 (*)    All other components within normal limits  FIBRINOGEN - Abnormal; Notable for the following components:   Fibrinogen 531 (*)    All other components within normal limits  TROPONIN I (HIGH SENSITIVITY) - Abnormal; Notable for the following components:   Troponin I (High Sensitivity) 21 (*)    All other components within normal limits  TROPONIN I (HIGH SENSITIVITY) - Abnormal; Notable for the following components:   Troponin I (High Sensitivity) 20 (*)    All other components within normal limits  EXPECTORATED SPUTUM ASSESSMENT W GRAM STAIN, RFLX TO RESP C  PROCALCITONIN  LACTATE DEHYDROGENASE  FERRITIN    EKG EKG Interpretation  Date/Time:  Monday August 23 2022 05:13:33 EST Ventricular Rate:  70 PR Interval:    QRS Duration: 130 QT  Interval:  402 QTC Calculation: 434 R Axis:   -69 Text Interpretation: VENTRICULAR PACING Underlying atrial fibrillation Left axis deviation Right bundle branch block Left ventricular hypertrophy with repolarization abnormality ( R in aVL ) Cannot rule out Anteroseptal infarct , age undetermined Abnormal ECG When compared with ECG of 22-Jul-2022 13:43, PREVIOUS ECG IS PRESENT No significant change since last tracing Reconfirmed by Ernie Avena ([id number]) on 08/23/2022 9:03:17 AM  Radiology DG Chest 2 View  Result Date: 08/23/2022 CLINICAL DATA:  81 year old male with shortness of breath and frequent community-acquired pneumonia is. EXAM: CHEST - 2 VIEW COMPARISON:  Chest radiographs 07/30/2022 and earlier. FINDINGS: PA and lateral views at 0533 hours. Stable left chest dual lead cardiac pacemaker. Stable lung volumes and mediastinal contours. Visualized tracheal air column is within normal limits. No pneumothorax, pulmonary edema or definite pleural effusion. Chronic platelike atelectasis or scarring in the lingula or right middle lobe does not appear significantly changed since February radiographs (lateral). And no acute or confluent pulmonary opacity is identified. Stable visualized osseous structures.  Negative visible bowel gas. IMPRESSION: Stable chronic lung disease. No acute cardiopulmonary abnormality identified. Electronically Signed   By: Odessa Fleming M.D.   On: 08/23/2022 05:47    Procedures Procedures    Medications Ordered in ED Medications  amLODipine (NORVASC) tablet 10 mg (10 mg Oral Given 08/23/22 1031)  aspirin EC tablet 81 mg (81 mg Oral Given 08/23/22 1134)  atorvastatin (LIPITOR) tablet 20 mg (has no administration in time range)  bisoprolol (ZEBETA) tablet 10 mg (10 mg Oral Given 08/23/22 1134)  budesonide (PULMICORT) nebulizer solution 0.5 mg (0.5 mg Nebulization Given 08/23/22 1139)  dapagliflozin propanediol (FARXIGA) tablet 10 mg (10 mg Oral Given 08/23/22 1135)   ipratropium-albuterol (DUONEB) 0.5-2.5 (3) MG/3ML nebulizer solution 3 mL (has no administration in time range)  nitroGLYCERIN (NITROSTAT) SL tablet 0.4 mg (has no administration in time range)  pantoprazole (PROTONIX) EC tablet 40 mg (has no administration in time range)  tamsulosin (FLOMAX) capsule 0.4 mg (has no administration in time range)  albuterol (PROVENTIL) (2.5 MG/3ML)  0.083% nebulizer solution 2.5 mg (has no administration in time range)  enoxaparin (LOVENOX) injection 40 mg (40 mg Subcutaneous Given 08/23/22 1137)  methylPREDNISolone sodium succinate (SOLU-MEDROL) 125 mg/2 mL injection 95 mg (has no administration in time range)    Followed by  predniSONE (DELTASONE) tablet 50 mg (has no administration in time range)  Ipratropium-Albuterol (COMBIVENT) respimat 1 puff (1 puff Inhalation Given 08/23/22 1136)  senna-docusate (Senokot-S) tablet 1 tablet (has no administration in time range)  dexamethasone (DECADRON) injection 10 mg (10 mg Intravenous Given 08/23/22 1032)  furosemide (LASIX) injection 40 mg (40 mg Intravenous Given 08/23/22 1033)    ED Course/ Medical Decision Making/ A&P Clinical Course as of 08/23/22 1304  Mon Aug 23, 2022  0846 SARS Coronavirus 2 by RT PCR(!): POSITIVE [JL]  0901 B Natriuretic Peptide(!): 506.4 [JL]  0956 B Natriuretic Peptide(!): 506.4 [JL]    Clinical Course User Index [JL] Ernie Avena, MD                           Medical Decision Making Amount and/or Complexity of Data Reviewed Labs:  Decision-making details documented in ED Course.  Risk OTC drugs. Prescription drug management. Decision regarding hospitalization.   81 year old male, former smoker followed for COPD, cor pulmonale, and chronic respiratory failure on supplemental O2, HTN, CHF, non-ischemic cardiomyopathy, sinoatrial node dysfunction s/p pacemaker, CAD, GERD, DM, HLD, anxiety who presents with 3-4 days of worsening shortness of breath.   The patient states that  he has had nasal congestion with dry cough.  He denies any chest pain.  He states that he has had worsening dyspnea on exertion.  No positional component to it.  He denies any lower extremity swelling.  Returned mildly productive.  He denies any fevers, endorsed chills at home. No sick contacts. He is chronically on 4L O2 via nasal cannula.   On arrival, the patient was afebrile, not tachycardic, mildly tachypneic on my evaluation, saturating 98% on his home 4 L O2 via nasal cannula.  Differential diagnosis includes URI, pulm worsening pulmonary hypertension, CHF exacerbation, COPD exacerbation, COVID-19.  Initial laboratory evaluation was concerning for COVID-19 PCR testing which resulted positive for COVID-19 via PCR.  A BMP was moderately elevated to 506, and initial troponin was elevated to 20.  The patient denies any chest pain at this time.  A CBC was out without a leukocytosis or anemia and a BMP was that without significant electrolyte abnormality.  Chest x-ray was performed revealed no evidence of focal consolidation to suggest a bacterial pneumonia.  Stable chronic interstitial lung disease findings were noted.  She has no lower extremity swelling to suggest volume overload/CHF exacerbation and no clear pulmonary edema on chest x-ray.  He is positive for COVID-19 but is at his baseline oxygen requirement.  I attempted to stand the patient and ambulate him and he immediately dropped his O2 saturations and felt dyspneic.  Due to this, I did recommend admission for observation.  Lower concern for acute PE at this time.  D-dimer was later collected and resulted negative after adjustment for age.  I ordered 10 mg of IV Decadron and consulted hospitalist medicine for admission.  Dr. Chipper Herb of hospitalist medicine subsequently admitted the patient in stable condition.    Final Clinical Impression(s) / ED Diagnoses Final diagnoses:  Hypoxia  Pulmonary hypertension (HCC)  COVID-19    Rx / DC  Orders ED Discharge Orders     None  Ernie Avena, MD 08/23/22 1304

## 2022-08-23 NOTE — ED Triage Notes (Addendum)
Patient arrived with EMS from home reports persistent SOB/chest congestion  with fever and chills for several days , uses O2 4 lpm/[place] continuously. Received Tylenol 324 mg by EMS prior to arrival .

## 2022-08-23 NOTE — H&P (Signed)
History and Physical    Bradley [name] [id number] DOB: 22-Sep-1941 DOA: 08/23/2022  PCP: Colon Branch, MD (Confirm with patient/family/NH records and if not entered, this has to be entered at [facility] point of entry) Patient coming from: Home  I have personally briefly reviewed patient's old medical records in [place]  Chief Complaint: Cough, SOB  HPI: Bradley HEDMAN Sr. is a 81 y.o. male with medical history significant of advanced COPD Gold stage II, respiratory failure on 4 L continuously, moderate pulmonary hypertension, chronic HFpEF, HTN, A-fib status post AV ablation and PPM, history of intracranial bleed, presented to ED with worsening of shortness of breath and cough.  5 days ago patient started to have runny nose congestion and dry cough, no fever chills no chest pain at this point.  Initially his symptoms improved in 2 days however over the weekend, symptoms came back with increasing shortness of breath, new onset of wheezing and cough turns to productive with thin whitish sputum, no fever chills no chest pains.  ED Course: Afebrile, patient was found to be tachypneic, O2 saturation baseline stabilized on 4 L.  Chest x-ray showed chronic bilateral pulmonary interstitial changes as before.  COVID-positive.  Patient was given IV Lasix 40 mg x 1 and IV Solu-Medrol 2 mg.  Review of Systems: As per HPI otherwise 14 point review of systems negative.    Past Medical History:  Diagnosis Date   Abnormal CT scan, chest 2012   CT chest, several lymphadenopathies. Sees pulmonary   Anemia    intermittent   Arthritis    "?back" (09/19/2018)   Atrial fibrillation (HCC)    s/p AV node ablation & BiV PPM implantation 09/08/11 (op dictation pending)   BPH (benign prostatic hyperplasia)    Saw Dr Reece Agar 2004, normal renal u/s   Bronchitis 08/26/2017   CAD (coronary artery disease)    CHF (congestive heart failure) ([place])    Thought primarily to be non-systolic although EF down  (EF 45-50% 12/2010, down to 35-40% 09/05/11), cath 2008 with no CAD, nuclear study 07/2011 showing Small area of reversibility in the distal ant/lat wall the left ventricle suspicious for ischemia/septal wall HK but felt to be low risk  (per D/C Summary 07/2011)   Chronic bronchitis ([place])    "get it ~ q yr"   Chronic diastolic heart failure ([place])         Chronic respiratory failure with hypoxia and hypercapnia ([place]) 09/14/2010   Followed in Pulmonary clinic/ [facility]/ [place]  Started on 02 2lpm at discharge 09/23/11   - PFT's 10/28/2011  FEV1  1.40 (51%)  with ratio 70 and no better p B2 and DLCO 53 corrects to 101    - PFT's 10/18/2014    FEV1 1.71 (59%) with ratio 68 and no sign change p B2 and DLCO 53 corrects to 85  -  HC03   07/28/20  = 33  -  HCO3   03/17/21     = 31     Heart murmur    HLD (hyperlipidemia)    HTN (hypertension)    ICB (intracranial bleed) ([place]) 06/2012   d/c coumadin permanently   Insomnia    Migraines    "very very rare"   On home oxygen therapy    "2L; 24/7" (09/19/2018)   Pacemaker    Peripheral vascular disease ([place])    ??   Pleural effusion 2008   S/p decortication   Pneumonia 08/02/2011   Pulmonary  HTN (HCC)    per cath 2008   Type II diabetes mellitus (HCC) 1999    Past Surgical History:  Procedure Laterality Date   BI-VENTRICULAR PACEMAKER INSERTION Left 09/08/2011   Procedure: BI-VENTRICULAR PACEMAKER INSERTION (CRT-P);  Surgeon: Marinus Maw, MD;  Location: [facility] CATH LAB;  Service: Cardiovascular;  Laterality: Left;   CARDIAC CATHETERIZATION     "couple times; never had balloon or stent" (09/19/2018)   CATARACT EXTRACTION W/ INTRAOCULAR LENS  IMPLANT, BILATERAL Bilateral 08/2018   COLONOSCOPY  03/10/11   normal   INSERT / REPLACE / REMOVE PACEMAKER  09/08/11   pacemaker placement   LUNG DECORTICATION     PLEURAL SCARIFICATION     pneumothorax with fibrothorax  ~ 2010   TONSILLECTOMY     "as a kid"      reports that he quit smoking about  41 years ago. His smoking use included cigarettes and cigars. He has a 62.50 pack-year smoking history. He has never used smokeless tobacco. He reports that he does not currently use alcohol. He reports that he does not use drugs.  Allergies  Allergen Reactions   Hydrocodone Other (See Comments)    "given to him in the hospital; went thru withdrawals once home; dr said not to take it again" (09/27/2012) dizziness   Ultram [Tramadol] Other (See Comments)    "given to him in the hospital; went thru withdrawals once home; dr said not to take it again" (09/27/2012) dizziness   Cialis [Tadalafil] Other (See Comments)    Headache Backache  Dizziness    Avelox [Moxifloxacin Hydrochloride] Itching and Other (See Comments)    Headache Dizziness    Family History  Problem Relation Age of Onset   Diabetes Father    Coronary artery disease Father    Polycythemia Mother    Drug abuse Son    Breast cancer Maternal Aunt    Prostate cancer Neg Hx    Colon cancer Neg Hx     Prior to Admission medications   Medication Sig Start Date End Date Taking? Authorizing Provider  acetaminophen (TYLENOL) 500 MG tablet Take 1,000 mg by mouth as needed for headache or moderate pain.   Yes [provider]  albuterol (VENTOLIN HFA) 108 (90 Base) MCG/ACT inhaler Inhale 2 puffs into the lungs every 6 (six) hours as needed for wheezing or shortness of breath. Patient taking differently: Inhale 1 puff into the lungs 2 (two) times daily. 03/11/21  Yes Ghimire, Werner Lean, MD  amLODipine (NORVASC) 10 MG tablet Take 1 tablet (10 mg total) by mouth daily. Patient taking differently: Take 10 mg by mouth in the morning. 06/07/22  Yes [name], MD  aspirin 81 MG tablet Take 81 mg by mouth every morning.    Yes [provider]  atorvastatin (LIPITOR) 20 MG tablet Take 1 tablet (20 mg total) by mouth at bedtime. Patient taking differently: Take 20 mg by mouth every evening. 06/07/22  Yes Paz, Nolon Rod, MD   bisoprolol (ZEBETA) 10 MG tablet Take 1 tablet (10 mg total) by mouth daily. Patient taking differently: Take 10 mg by mouth in the morning. 07/23/22  Yes Cipriano Bunker, MD  budesonide (PULMICORT) 0.5 MG/2ML nebulizer solution Take 2 mLs (0.5 mg total) by nebulization 2 (two) times daily. 08/02/22  Yes Paz, Nolon Rod, MD  calcium-vitamin D (OSCAL WITH D) 500-200 MG-UNIT tablet Take 1 tablet by mouth every morning.   Yes [provider]  cholecalciferol (VITAMIN D3) 25 MCG (1000  UNIT) tablet Take 1,000 Units by mouth every evening.   Yes [provider]  diphenhydramine-acetaminophen (TYLENOL PM) 25-500 MG TABS tablet Take 1 tablet by mouth at bedtime as needed (sleep).   Yes [provider]  FARXIGA 10 MG TABS tablet TAKE 1 TABLET DAILY BEFORE BREAKFAST Patient taking differently: Take 10 mg by mouth in the morning. 03/29/22  Yes Paz, Alda Berthold, MD  furosemide (LASIX) 40 MG tablet TAKE 2 TABLETS EVERY MORNING AND 1 TABLET LATE IN THE AFTERNOON Patient taking differently: Take 40 mg by mouth in the morning. 02/26/22  Yes Skeet Latch, MD  ipratropium-albuterol (DUONEB) 0.5-2.5 (3) MG/3ML SOLN Take 3 mLs by nebulization every 6 (six) hours as needed. Patient taking differently: Take 3 mLs by nebulization every 6 (six) hours as needed (wheezing, shortness of breath). 08/02/22  Yes Colon Branch, MD  Multiple Vitamins-Minerals (MULTIVITAMINS THER. W/MINERALS) TABS tablet Take 1 tablet by mouth daily. Patient taking differently: Take 1 tablet by mouth in the morning. 09/14/13  Yes Paz, Alda Berthold, MD  nitroGLYCERIN (NITROSTAT) 0.4 MG SL tablet Place 1 tablet (0.4 mg total) under the tongue every 5 (five) minutes x 3 doses as needed for chest pain. 09/07/21  Yes Colon Branch, MD  pantoprazole (PROTONIX) 40 MG tablet Take 1 tablet (40 mg total) by mouth daily before breakfast. 03/05/22  Yes Colon Branch, MD  tamsulosin (FLOMAX) 0.4 MG CAPS capsule Take 1 capsule (0.4 mg total) by mouth  daily after supper. Patient taking differently: Take 0.4 mg by mouth in the morning. 05/27/22  Yes Paz, Alda Berthold, MD  zolpidem (AMBIEN) 10 MG tablet Take 10 mg by mouth at bedtime as needed for sleep.   Yes [provider]  Lancets Glory Rosebush ULTRASOFT) lancets Check blood sugars no more than twice daily 03/08/17   Colon Branch, MD  [facility] VERIO test strip USE TO CHECK BLOOD SUGAR NO MORE THAN TWICE A DAY 02/09/21   Colon Branch, MD  potassium chloride SA (KLOR-CON M) 20 MEQ tablet Take 1 tablet (20 mEq total) by mouth daily for 5 doses. Patient not taking: Reported on 08/23/2022 07/30/22 10/16/22  Clayton Bibles, NP  predniSONE (DELTASONE) 10 MG tablet 4 tabs for 3 days, then 3 tabs for 3 days, 2 tabs for 3 days, then 1 tab for 3 days, then stop Patient not taking: Reported on 08/23/2022 07/30/22   Clayton Bibles, NP    Physical Exam: Vitals:   08/23/22 0518 08/23/22 0626  BP: 126/68 103/64  Pulse: 72 68  Resp: 18 (!) 24  Temp: 98.3 F (36.8 C) 98 F (36.7 C)  TempSrc:  Oral  SpO2: 94% 98%    Constitutional: NAD, calm, comfortable Vitals:   08/23/22 0518 08/23/22 0626  BP: 126/68 103/64  Pulse: 72 68  Resp: 18 (!) 24  Temp: 98.3 F (36.8 C) 98 F (36.7 C)  TempSrc:  Oral  SpO2: 94% 98%   Eyes: PERRL, lids and conjunctivae normal ENMT: Mucous membranes are moist. Posterior pharynx clear of any exudate or lesions.Normal dentition.  Neck: normal, supple, no masses, no thyromegaly Respiratory: Diminished breathing sound bilaterally, diffused wheezing bilaterally,, no crackles.  Increasing breathing effort respiratory effort. No accessory muscle use.  Cardiovascular: Regular rate and rhythm, no murmurs / rubs / gallops. No extremity edema. 2+ pedal pulses. No carotid bruits.  Abdomen: no tenderness, no masses palpated. No hepatosplenomegaly. Bowel sounds positive.  Musculoskeletal: no clubbing / cyanosis. No joint deformity upper and  lower extremities. Good ROM, no  contractures. Normal muscle tone.  Skin: no rashes, lesions, ulcers. No induration Neurologic: CN 2-12 grossly intact. Sensation intact, DTR normal. Strength 5/5 in all 4.  Psychiatric: Normal judgment and insight. Alert and oriented x 3. Normal mood.     Labs on Admission: I have personally reviewed following labs and imaging studies  CBC: Recent Labs  Lab 08/23/22 0530  WBC 4.5  NEUTROABS 2.8  HGB 13.2  HCT 38.4*  MCV 92.8  PLT 123XX123*   Basic Metabolic Panel: Recent Labs  Lab 08/23/22 0530  NA 139  K 4.1  CL 100  CO2 29  GLUCOSE 128*  BUN 6*  CREATININE 0.81  CALCIUM 8.5*   GFR: Estimated Creatinine Clearance: 81 mL/min (by C-G formula based on SCr of 0.81 mg/dL). Liver Function Tests: No results for input(s): "AST", "ALT", "ALKPHOS", "BILITOT", "PROT", "ALBUMIN" in the last 168 hours. No results for input(s): "LIPASE", "AMYLASE" in the last 168 hours. No results for input(s): "AMMONIA" in the last 168 hours. Coagulation Profile: No results for input(s): "INR", "PROTIME" in the last 168 hours. Cardiac Enzymes: No results for input(s): "CKTOTAL", "CKMB", "CKMBINDEX", "TROPONINI" in the last 168 hours. BNP (last 3 results) No results for input(s): "PROBNP" in the last 8760 hours. HbA1C: No results for input(s): "HGBA1C" in the last 72 hours. CBG: No results for input(s): "GLUCAP" in the last 168 hours. Lipid Profile: No results for input(s): "CHOL", "HDL", "LDLCALC", "TRIG", "CHOLHDL", "LDLDIRECT" in the last 72 hours. Thyroid Function Tests: No results for input(s): "TSH", "T4TOTAL", "FREET4", "T3FREE", "THYROIDAB" in the last 72 hours. Anemia Panel: No results for input(s): "VITAMINB12", "FOLATE", "FERRITIN", "TIBC", "IRON", "RETICCTPCT" in the last 72 hours. Urine analysis:    Component Value Date/Time   COLORURINE YELLOW 07/15/2015 1440   APPEARANCEUR CLEAR 07/15/2015 1440   LABSPEC 1.015 07/15/2015 1440   PHURINE 6.0 07/15/2015 1440   GLUCOSEU NEGATIVE  07/15/2015 1440   HGBUR TRACE-LYSED (A) 07/15/2015 1440   BILIRUBINUR NEGATIVE 07/15/2015 1440   KETONESUR TRACE (A) 07/15/2015 1440   PROTEINUR NEGATIVE 07/06/2012 0341   UROBILINOGEN 1.0 07/15/2015 1440   NITRITE NEGATIVE 07/15/2015 1440   LEUKOCYTESUR NEGATIVE 07/15/2015 1440    Radiological Exams on Admission: DG Chest 2 View  Result Date: 08/23/2022 CLINICAL DATA:  81 year old male with shortness of breath and frequent community-acquired pneumonia is. EXAM: CHEST - 2 VIEW COMPARISON:  Chest radiographs 07/30/2022 and earlier. FINDINGS: PA and lateral views at 0533 hours. Stable left chest dual lead cardiac pacemaker. Stable lung volumes and mediastinal contours. Visualized tracheal air column is within normal limits. No pneumothorax, pulmonary edema or definite pleural effusion. Chronic platelike atelectasis or scarring in the lingula or right middle lobe does not appear significantly changed since February radiographs (lateral). And no acute or confluent pulmonary opacity is identified. Stable visualized osseous structures.  Negative visible bowel gas. IMPRESSION: Stable chronic lung disease. No acute cardiopulmonary abnormality identified. Electronically Signed   By: Genevie Ann M.D.   On: 08/23/2022 05:47    EKG: Independently reviewed.  Ventricular paced  Assessment/Plan Principal Problem:   COVID Active Problems:   Chronic pulmonary hypertension secondary to elevated L H pressures    COPD exacerbation (HCC)   Acute on chronic respiratory failure with hypoxia (HCC)  (please populate well all problems here in Problem List. (For example, if patient is on BP meds at home and you resume or decide to hold them, it is a problem that needs to be her. Same  for CAD, COPD, HLD and so on)  Acute COPD exacerbation -Most likely triggered by COVID infection -Since onset of respiratory symptoms more than 5 days out of window of antiviral treatment, decided not to treat with antiviral  medications, discussed with patient who expressed understanding and agreed. -Short course of IV Solu-Medrol bridging for p.o. steroid. -Breathing treatment, pulmicort -Incentive spirometry and flutter valve -No significant infiltrates or consolidation on the x-ray, no fever, no leukocytosis, hold off antibiotics.  COVID 19 infection -As above  Chronic HFpEF and pulmonary hypertension -Clinically appears to be euvolemic, hold off further diuresis, patient just received 1 dose of IV Lasix in the ED. -Continue bisoprolol  HTN -Continue amlodipine  IIDM -Continue Farxiga  PAF s/p AV node ablation and PPM -No acute concern, in paced rhythm, not on anticoagulation due to history of intracranial bleed.  Deconditioning -PT evaluation  DVT prophylaxis: Lovenox Code Status: Full code Family Communication: None at bedside Disposition Plan: None  Consults called: NOne Admission status: Medsurg admit   Lequita Halt MD [facility] Pager [phone]  08/23/2022, 10:26 AM

## 2022-08-24 ENCOUNTER — Ambulatory Visit: Payer: BLUE CROSS/BLUE SHIELD | Admitting: Family Medicine

## 2022-08-24 ENCOUNTER — Telehealth: Payer: Self-pay | Admitting: *Deleted

## 2022-08-24 DIAGNOSIS — I272 Pulmonary hypertension, unspecified: Secondary | ICD-10-CM | POA: Diagnosis not present

## 2022-08-24 DIAGNOSIS — J441 Chronic obstructive pulmonary disease with (acute) exacerbation: Secondary | ICD-10-CM

## 2022-08-24 DIAGNOSIS — U071 COVID-19: Secondary | ICD-10-CM | POA: Diagnosis not present

## 2022-08-24 LAB — CBC WITH DIFFERENTIAL/PLATELET
Abs Immature Granulocytes: 0.01 10*3/uL (ref 0.00–0.07)
Basophils Absolute: 0 10*3/uL (ref 0.0–0.1)
Basophils Relative: 0 %
Eosinophils Absolute: 0 10*3/uL (ref 0.0–0.5)
Eosinophils Relative: 0 %
HCT: 42.5 % (ref 39.0–52.0)
Hemoglobin: 14.4 g/dL (ref 13.0–17.0)
Immature Granulocytes: 0 %
Lymphocytes Relative: 20 %
Lymphs Abs: 0.7 10*3/uL (ref 0.7–4.0)
MCH: 31.2 pg (ref 26.0–34.0)
MCHC: 33.9 g/dL (ref 30.0–36.0)
MCV: 92.2 fL (ref 80.0–100.0)
Monocytes Absolute: 0.2 10*3/uL (ref 0.1–1.0)
Monocytes Relative: 6 %
Neutro Abs: 2.6 10*3/uL (ref 1.7–7.7)
Neutrophils Relative %: 74 %
Platelets: 132 10*3/uL — ABNORMAL LOW (ref 150–400)
RBC: 4.61 MIL/uL (ref 4.22–5.81)
RDW: 14.2 % (ref 11.5–15.5)
WBC: 3.5 10*3/uL — ABNORMAL LOW (ref 4.0–10.5)
nRBC: 0 % (ref 0.0–0.2)

## 2022-08-24 LAB — GLUCOSE, CAPILLARY
Glucose-Capillary: 169 mg/dL — ABNORMAL HIGH (ref 70–99)
Glucose-Capillary: 192 mg/dL — ABNORMAL HIGH (ref 70–99)
Glucose-Capillary: 219 mg/dL — ABNORMAL HIGH (ref 70–99)

## 2022-08-24 LAB — COMPREHENSIVE METABOLIC PANEL
ALT: 20 U/L (ref 0–44)
AST: 22 U/L (ref 15–41)
Albumin: 3.4 g/dL — ABNORMAL LOW (ref 3.5–5.0)
Alkaline Phosphatase: 66 U/L (ref 38–126)
Anion gap: 11 (ref 5–15)
BUN: 15 mg/dL (ref 8–23)
CO2: 28 mmol/L (ref 22–32)
Calcium: 9 mg/dL (ref 8.9–10.3)
Chloride: 101 mmol/L (ref 98–111)
Creatinine, Ser: 0.77 mg/dL (ref 0.61–1.24)
GFR, Estimated: >60 mL/min (ref >60)
Glucose, Bld: 135 mg/dL — ABNORMAL HIGH (ref 70–99)
Potassium: 3.5 mmol/L (ref 3.5–5.1)
Sodium: 140 mmol/L (ref 135–145)
Total Bilirubin: 0.4 mg/dL (ref 0.3–1.2)
Total Protein: 6.9 g/dL (ref 6.5–8.1)

## 2022-08-24 LAB — C-REACTIVE PROTEIN: CRP: 2.8 mg/dL — ABNORMAL HIGH (ref <1.0)

## 2022-08-24 LAB — FERRITIN: Ferritin: 260 ng/mL (ref 24–336)

## 2022-08-24 LAB — D-DIMER, QUANTITATIVE: D-Dimer, Quant: 0.41 ug/mL-FEU (ref 0.00–0.50)

## 2022-08-24 MED ORDER — FUROSEMIDE 40 MG PO TABS
40.0000 mg | ORAL_TABLET | Freq: Every day | ORAL | Status: DC
  Administered 2022-08-24 – 2022-08-25 (×2): 40 mg via ORAL
  Filled 2022-08-24 (×2): qty 1

## 2022-08-24 MED ORDER — INSULIN ASPART 100 UNIT/ML IJ SOLN
0.0000 [IU] | Freq: Three times a day (TID) | INTRAMUSCULAR | Status: DC
  Administered 2022-08-24 (×2): 2 [IU] via SUBCUTANEOUS
  Administered 2022-08-25: 1 [IU] via SUBCUTANEOUS

## 2022-08-24 MED ORDER — IPRATROPIUM-ALBUTEROL 0.5-2.5 (3) MG/3ML IN SOLN
3.0000 mL | Freq: Two times a day (BID) | RESPIRATORY_TRACT | Status: DC
  Administered 2022-08-24 – 2022-08-25 (×2): 3 mL via RESPIRATORY_TRACT
  Filled 2022-08-24 (×2): qty 3

## 2022-08-24 MED ORDER — IPRATROPIUM-ALBUTEROL 0.5-2.5 (3) MG/3ML IN SOLN
3.0000 mL | Freq: Four times a day (QID) | RESPIRATORY_TRACT | Status: DC
  Administered 2022-08-24: 3 mL via RESPIRATORY_TRACT
  Filled 2022-08-24: qty 3

## 2022-08-24 NOTE — Telephone Encounter (Signed)
Left message for patient to call back  

## 2022-08-24 NOTE — Telephone Encounter (Signed)
-----   Message from Carl Best sent at 08/24/2022  1:57 PM EST ----- Regarding: FW: Pt wants to know if he can remove L arm brace  ----- Message ----- From: Carl Best Sent: 08/24/2022  10:47 AM EST To: Annita Brod, CMA Subject: Pt wants to know if he can remove L arm brace    Hey [place],  This pt is in the [facility] for the next 2 days & wants to know if he can remove the L arm brace Dr. Jordan Likes put on him at his last OV, or if should he keep it on. ---  Can one of your Doc's look @ Jordan Likes notes and advise him---Call him on his cell# he has it with him ( [phone] (M) )

## 2022-08-24 NOTE — Progress Notes (Signed)
PROGRESS NOTE        PATIENT DETAILS Name: Bradley BACHUS Sr. Age: 81 y.o. Sex: male Date of Birth: 1940/11/20 Admit Date: 08/23/2022 Admitting Physician Lequita Halt, MD PCP:Paz, Alda Berthold, MD  Brief Summary: Patient is a 81 y.o.  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  Significant events: 11/13>> admit to [facility]-COPD exacerbation-COVID-19 infection.  Significant studies: 11/13>> CXR: No PNA  Significant microbiology data: 11/13>> influenza PCR: Negative 11/13>> COVID PCR: Positive  Procedures: None  Consults: None  Subjective: Feels better-still "a little tight" in his chest.  Appears comfortable.  Objective: Vitals: Blood pressure 131/83, pulse 77, temperature 98.3 F (36.8 C), resp. rate 18, height 5\' 9"  (1.753 m), weight 78.3 kg, SpO2 95 %.   Exam: Gen Exam:Alert awake-not in any distress HEENT:atraumatic, normocephalic Chest: Moving air well-some scattered rhonchi. CVS:S1S2 regular Abdomen:soft non tender, non distended Extremities:no edema Neurology: Non focal Skin: no rash  Pertinent Labs/Radiology:    Latest Ref Rng & Units 08/24/2022    3:00 AM 08/23/2022    5:30 AM 07/27/2022    4:10 PM  CBC  WBC 4.0 - 10.5 K/uL 3.5  4.5  13.4   Hemoglobin 13.0 - 17.0 g/dL 14.4  13.2  14.9   Hematocrit 39.0 - 52.0 % 42.5  38.4  45.4   Platelets 150 - 400 K/uL 132  117  237.0     Lab Results  Component Value Date   NA 140 08/24/2022   K 3.5 08/24/2022   CL 101 08/24/2022   CO2 28 08/24/2022      Assessment/Plan: COPD exacerbation Likely triggered by COVID-19 infection Improving but not yet at baseline Continue steroids/bronchodilators  COVID-19 infection No PNA on CXR Inflammatory markers not impressive Continue  steroids  Chronic HFpEF Euvolemic Continue Farxiga/beta-blocker Resume usual dosing of diuretics  HTN BP stable Continue amlodipine/bisoprolol  Chronic atrial fibrillation s/p AV nodal ablation and PPM insertion Stable-suspect not on anticoagulation due to history of intracranial bleeding.  DM-2 (A1c 6.2 on 10/9) CBG stable Farxiga Added SSI Follow/optimize  Displaced oblique fracture of left third metatarsal shaft of left hand following a mechanical fall Continue splint Per patient this morning-he apparently called his Ortho/sports medicine-and was told to follow-up postdischarge Follow-up with sports medicine postdischarge to reimage.  BMI Estimated body mass index is 25.49 kg/m as calculated from the following:   Height as of this encounter: 5\' 9"  (1.753 m).   Weight as of this encounter: 78.3 kg.   Code status:   Code Status: Full Code   DVT Prophylaxis: enoxaparin (LOVENOX) injection 40 mg Start: 08/23/22 1030   Family Communication: None at bedside   Disposition Plan: Status is: Inpatient Remains inpatient appropriate because: COPD exacerbation-not yet at baseline.   Planned Discharge Destination:Home likely in the next 24-48 hours depending on clinical improvement.   Diet: Diet Order             Diet Heart Room service appropriate? Yes; Fluid consistency: Thin  Diet effective now                     Antimicrobial agents: Anti-infectives (From admission, onward)  None        MEDICATIONS: Scheduled Meds:  amLODipine  10 mg Oral Daily   aspirin EC  81 mg Oral q morning   atorvastatin  20 mg Oral QHS   bisoprolol  10 mg Oral Daily   budesonide  0.5 mg Nebulization BID   dapagliflozin propanediol  10 mg Oral Daily   enoxaparin (LOVENOX) injection  40 mg Subcutaneous Q24H   ipratropium-albuterol  3 mL Nebulization BID   methylPREDNISolone (SOLU-MEDROL) injection  95 mg Intravenous Q24H   Followed by   Derrill Memo ON 08/27/2022]  predniSONE  50 mg Oral Daily   pantoprazole  40 mg Oral QAC breakfast   tamsulosin  0.4 mg Oral QPC supper   Continuous Infusions: PRN Meds:.albuterol, nitroGLYCERIN, senna-docusate   I have personally reviewed following labs and imaging studies  LABORATORY DATA: CBC: Recent Labs  Lab 08/23/22 0530 08/24/22 0300  WBC 4.5 3.5*  NEUTROABS 2.8 2.6  HGB 13.2 14.4  HCT 38.4* 42.5  MCV 92.8 92.2  PLT 117* 132*    Basic Metabolic Panel: Recent Labs  Lab 08/23/22 0530 08/24/22 0300  NA 139 140  K 4.1 3.5  CL 100 101  CO2 29 28  GLUCOSE 128* 135*  BUN 6* 15  CREATININE 0.81 0.77  CALCIUM 8.5* 9.0    GFR: Estimated Creatinine Clearance: 72.4 mL/min (by C-G formula based on SCr of 0.77 mg/dL).  Liver Function Tests: Recent Labs  Lab 08/24/22 0300  AST 22  ALT 20  ALKPHOS 66  BILITOT 0.4  PROT 6.9  ALBUMIN 3.4*   No results for input(s): "LIPASE", "AMYLASE" in the last 168 hours. No results for input(s): "AMMONIA" in the last 168 hours.  Coagulation Profile: No results for input(s): "INR", "PROTIME" in the last 168 hours.  Cardiac Enzymes: No results for input(s): "CKTOTAL", "CKMB", "CKMBINDEX", "TROPONINI" in the last 168 hours.  BNP (last 3 results) No results for input(s): "PROBNP" in the last 8760 hours.  Lipid Profile: No results for input(s): "CHOL", "HDL", "LDLCALC", "TRIG", "CHOLHDL", "LDLDIRECT" in the last 72 hours.  Thyroid Function Tests: No results for input(s): "TSH", "T4TOTAL", "FREET4", "T3FREE", "THYROIDAB" in the last 72 hours.  Anemia Panel: Recent Labs    08/23/22 1219 08/24/22 0300  FERRITIN 232 260    Urine analysis:    Component Value Date/Time   COLORURINE YELLOW 07/15/2015 1440   APPEARANCEUR CLEAR 07/15/2015 1440   LABSPEC 1.015 07/15/2015 1440   PHURINE 6.0 07/15/2015 1440   GLUCOSEU NEGATIVE 07/15/2015 1440   HGBUR TRACE-LYSED (A) 07/15/2015 1440   BILIRUBINUR NEGATIVE 07/15/2015 1440   KETONESUR TRACE (A)  07/15/2015 1440   PROTEINUR NEGATIVE 07/06/2012 0341   UROBILINOGEN 1.0 07/15/2015 1440   NITRITE NEGATIVE 07/15/2015 1440   LEUKOCYTESUR NEGATIVE 07/15/2015 1440    Sepsis Labs: Lactic Acid, Venous    Component Value Date/Time   LATICACIDVEN 0.8 05/07/2019 1041    MICROBIOLOGY: Recent Results (from the past 240 hour(s))  Resp Panel by RT-PCR (Flu A&B, Covid) Anterior Nasal Swab     Status: Abnormal   Collection Time: 08/23/22  5:17 AM   Specimen: Anterior Nasal Swab  Result Value Ref Range Status   SARS Coronavirus 2 by RT PCR POSITIVE (A) NEGATIVE Final    Comment: (NOTE) SARS-CoV-2 target nucleic acids are DETECTED.  The SARS-CoV-2 RNA is generally detectable in upper respiratory specimens during the acute phase of infection. Positive results are indicative of the presence of the identified virus, but do not rule out  bacterial infection or co-infection with other pathogens not detected by the test. Clinical correlation with patient history and other diagnostic information is necessary to determine patient infection status. The expected result is Negative.  Fact Sheet for Patients: BloggerCourse.com  Fact Sheet for Healthcare Providers: SeriousBroker.it  This test is not yet approved or cleared by the [place] FDA and  has been authorized for detection and/or diagnosis of SARS-CoV-2 by FDA under an Emergency Use Authorization (EUA).  This EUA will remain in effect (meaning this test can be used) for the duration of  the COVID-19 declaration under Section 564(b)(1) of the A ct, 21 U.S.C. section 360bbb-3(b)(1), unless the authorization is terminated or revoked sooner.     Influenza A by PCR NEGATIVE NEGATIVE Final   Influenza B by PCR NEGATIVE NEGATIVE Final    Comment: (NOTE) The Xpert Xpress SARS-CoV-2/FLU/RSV plus assay is intended as an aid in the diagnosis of influenza from Nasopharyngeal swab specimens  and should not be used as a sole basis for treatment. Nasal washings and aspirates are unacceptable for Xpert Xpress SARS-CoV-2/FLU/RSV testing.  Fact Sheet for Patients: BloggerCourse.com  Fact Sheet for Healthcare Providers: SeriousBroker.it  This test is not yet approved or cleared by the [place] FDA and has been authorized for detection and/or diagnosis of SARS-CoV-2 by FDA under an Emergency Use Authorization (EUA). This EUA will remain in effect (meaning this test can be used) for the duration of the COVID-19 declaration under Section 564(b)(1) of the Act, 21 U.S.C. section 360bbb-3(b)(1), unless the authorization is terminated or revoked.  Performed at [facility], [address], [place], [place] [address]     RADIOLOGY STUDIES/RESULTS: DG Chest 2 View  Result Date: 08/23/2022 CLINICAL DATA:  81 year old male with shortness of breath and frequent community-acquired pneumonia is. EXAM: CHEST - 2 VIEW COMPARISON:  Chest radiographs 07/30/2022 and earlier. FINDINGS: PA and lateral views at 0533 hours. Stable left chest dual lead cardiac pacemaker. Stable lung volumes and mediastinal contours. Visualized tracheal air column is within normal limits. No pneumothorax, pulmonary edema or definite pleural effusion. Chronic platelike atelectasis or scarring in the lingula or right middle lobe does not appear significantly changed since February radiographs (lateral). And no acute or confluent pulmonary opacity is identified. Stable visualized osseous structures.  Negative visible bowel gas. IMPRESSION: Stable chronic lung disease. No acute cardiopulmonary abnormality identified. Electronically Signed   By: Odessa Fleming M.D.   On: 08/23/2022 05:47     LOS: 1 day   Jeoffrey Massed, MD  [facility]    To contact the attending provider between 7A-7P or the covering provider during after hours 7P-7A, please log into the  web site [email or web address] and access using universal [place] password for that web site. If you do not have the password, please call the hospital operator.  08/24/2022, 9:41 AM

## 2022-08-24 NOTE — Telephone Encounter (Signed)
I advised pt to call us back to r/s his 2 wk f/u when he knows he is going to be discharged from the hospital so we can get further imaging and then he and Dr. Jordan Likes will determine if he can come out of his splint.

## 2022-08-24 NOTE — Evaluation (Signed)
Physical Therapy Evaluation/ Discharge Patient Details Name: Bradley CHUBB Sr. MRN: [id number] DOB: [birth year] Today's Date: 08/24/2022  History of Present Illness  81 yo male admitted 11/13 with SOB and Covid (+). PMhx; COPD, cor pulmonale, chronic respiratory failure on 4L O2, HTN, CHF, NICM, PPM, CAD, GERD, DM, HLD, anxiety  Clinical Impression  Pt very pleasant on baseline 4L with pt able to tolerate long hall ambulation with sats maintained>92%. Pt does the heavy housework, cooking and driving as well as caring for himself. Pt does endorse recent fall after tripping over parking curb but not other falls or LOB. Pt walking without assist and maintaining SPO2 on 4L. Pt without further therapy needs and encouraged mobility with nursing and mobility specialists during admission.      Recommendations for follow up therapy are one component of a multi-disciplinary discharge planning process, led by the attending physician.  Recommendations may be updated based on patient status, additional functional criteria and insurance authorization.  Follow Up Recommendations No PT follow up      Assistance Recommended at Discharge None  Patient can return home with the following       Equipment Recommendations None recommended by PT  Recommendations for Other Services       Functional Status Assessment Patient has not had a recent decline in their functional status     Precautions / Restrictions Precautions Precautions: None      Mobility  Bed Mobility               General bed mobility comments: EOB on arrival    Transfers Overall transfer level: Modified independent                      Ambulation/Gait Ambulation/Gait assistance: Independent Gait Distance (Feet): 300 Feet Assistive device: None Gait Pattern/deviations: WFL(Within Functional Limits)   Gait velocity interpretation: >2.62 ft/sec, indicative of community ambulatory      Stairs             Wheelchair Mobility    Modified Rankin (Stroke Patients Only)       Balance Overall balance assessment: Mild deficits observed, not formally tested                                           Pertinent Vitals/Pain Pain Assessment Pain Assessment: No/denies pain    Home Living Family/patient expects to be discharged to:: Private residence Living Arrangements: Spouse/significant other Available Help at Discharge: Family;Available PRN/intermittently Type of Home: House Home Access: Stairs to enter   Entrance Stairs-Number of Steps: 3 Alternate Level Stairs-Number of Steps: stair lift available to 2nd floor Home Layout: Multi-level Home Equipment: Cane - single point;Shower seat - built in;Grab bars - tub/shower      Prior Function Prior Level of Function : Independent/Modified Independent             Mobility Comments: no AD ADLs Comments: grocery shops, does the stairs, cooks, assists wife (had 2 broken hips)     Hand Dominance        Extremity/Trunk Assessment   Upper Extremity Assessment Upper Extremity Assessment: Overall WFL for tasks assessed    Lower Extremity Assessment Lower Extremity Assessment: Overall WFL for tasks assessed    Cervical / Trunk Assessment Cervical / Trunk Assessment: Normal  Communication   Communication: No difficulties  Cognition Arousal/Alertness: Awake/alert Behavior During Therapy:  WFL for tasks assessed/performed Overall Cognitive Status: Within Functional Limits for tasks assessed                                          General Comments      Exercises     Assessment/Plan    PT Assessment Patient does not need any further PT services  PT Problem List         PT Treatment Interventions      PT Goals (Current goals can be found in the Care Plan section)  Acute Rehab PT Goals PT Goal Formulation: All assessment and education complete, DC therapy    Frequency        Co-evaluation               AM-PAC PT "6 Clicks" Mobility  Outcome Measure Help needed turning from your back to your side while in a flat bed without using bedrails?: None Help needed moving from lying on your back to sitting on the side of a flat bed without using bedrails?: None Help needed moving to and from a bed to a chair (including a wheelchair)?: None Help needed standing up from a chair using your arms (e.g., wheelchair or bedside chair)?: None Help needed to walk in hospital room?: None Help needed climbing 3-5 steps with a railing? : A Little 6 Click Score: 23    End of Session Equipment Utilized During Treatment: Oxygen Activity Tolerance: Patient tolerated treatment well Patient left: in chair;with call bell/phone within reach Nurse Communication: Mobility status PT Visit Diagnosis: Other abnormalities of gait and mobility (R26.89)    Time: 2947-6546 PT Time Calculation (min) (ACUTE ONLY): 25 min   Charges:   PT Evaluation $PT Eval Moderate Complexity: 1 Mod          Heike Pounds P, PT Acute Rehabilitation Services Office: [phone]   Enedina Finner Rachael Zapanta 08/24/2022, 12:56 PM

## 2022-08-25 ENCOUNTER — Other Ambulatory Visit (HOSPITAL_COMMUNITY): Payer: Self-pay

## 2022-08-25 DIAGNOSIS — I272 Pulmonary hypertension, unspecified: Secondary | ICD-10-CM | POA: Diagnosis not present

## 2022-08-25 DIAGNOSIS — E1169 Type 2 diabetes mellitus with other specified complication: Secondary | ICD-10-CM | POA: Diagnosis not present

## 2022-08-25 DIAGNOSIS — J441 Chronic obstructive pulmonary disease with (acute) exacerbation: Secondary | ICD-10-CM | POA: Diagnosis not present

## 2022-08-25 DIAGNOSIS — U071 COVID-19: Secondary | ICD-10-CM | POA: Diagnosis not present

## 2022-08-25 LAB — CBC WITH DIFFERENTIAL/PLATELET
Abs Immature Granulocytes: 0.03 10*3/uL (ref 0.00–0.07)
Basophils Absolute: 0 10*3/uL (ref 0.0–0.1)
Basophils Relative: 0 %
Eosinophils Absolute: 0 10*3/uL (ref 0.0–0.5)
Eosinophils Relative: 0 %
HCT: 38.7 % — ABNORMAL LOW (ref 39.0–52.0)
Hemoglobin: 13.5 g/dL (ref 13.0–17.0)
Immature Granulocytes: 0 %
Lymphocytes Relative: 13 %
Lymphs Abs: 1 10*3/uL (ref 0.7–4.0)
MCH: 31.3 pg (ref 26.0–34.0)
MCHC: 34.9 g/dL (ref 30.0–36.0)
MCV: 89.8 fL (ref 80.0–100.0)
Monocytes Absolute: 0.6 10*3/uL (ref 0.1–1.0)
Monocytes Relative: 7 %
Neutro Abs: 6.4 10*3/uL (ref 1.7–7.7)
Neutrophils Relative %: 80 %
Platelets: 140 10*3/uL — ABNORMAL LOW (ref 150–400)
RBC: 4.31 MIL/uL (ref 4.22–5.81)
RDW: 14.1 % (ref 11.5–15.5)
WBC: 8 10*3/uL (ref 4.0–10.5)
nRBC: 0 % (ref 0.0–0.2)

## 2022-08-25 LAB — COMPREHENSIVE METABOLIC PANEL
ALT: 17 U/L (ref 0–44)
AST: 18 U/L (ref 15–41)
Albumin: 3.1 g/dL — ABNORMAL LOW (ref 3.5–5.0)
Alkaline Phosphatase: 63 U/L (ref 38–126)
Anion gap: 9 (ref 5–15)
BUN: 17 mg/dL (ref 8–23)
CO2: 29 mmol/L (ref 22–32)
Calcium: 8.6 mg/dL — ABNORMAL LOW (ref 8.9–10.3)
Chloride: 100 mmol/L (ref 98–111)
Creatinine, Ser: 0.85 mg/dL (ref 0.61–1.24)
GFR, Estimated: >60 mL/min (ref >60)
Glucose, Bld: 127 mg/dL — ABNORMAL HIGH (ref 70–99)
Potassium: 3.5 mmol/L (ref 3.5–5.1)
Sodium: 138 mmol/L (ref 135–145)
Total Bilirubin: 0.6 mg/dL (ref 0.3–1.2)
Total Protein: 6.1 g/dL — ABNORMAL LOW (ref 6.5–8.1)

## 2022-08-25 LAB — FERRITIN: Ferritin: 320 ng/mL (ref 24–336)

## 2022-08-25 LAB — C-REACTIVE PROTEIN: CRP: 0.9 mg/dL (ref <1.0)

## 2022-08-25 LAB — D-DIMER, QUANTITATIVE: D-Dimer, Quant: 0.31 ug/mL-FEU (ref 0.00–0.50)

## 2022-08-25 LAB — GLUCOSE, CAPILLARY: Glucose-Capillary: 141 mg/dL — ABNORMAL HIGH (ref 70–99)

## 2022-08-25 MED ORDER — PREDNISONE 10 MG PO TABS
ORAL_TABLET | ORAL | 0 refills | Status: DC
  Filled 2022-08-25: qty 10, 4d supply, fill #0

## 2022-08-25 MED ORDER — PREDNISONE 10 MG PO TABS: ORAL_TABLET | ORAL | 0 refills | Status: DC

## 2022-08-25 NOTE — Progress Notes (Signed)
Mobility Specialist Progress Note:   08/25/22 1126  Mobility  Activity Ambulated independently in room  Level of Assistance Independent  Assistive Device None  Distance Ambulated (ft) 10 ft  Activity Response Tolerated well  Mobility Referral Yes  $Mobility charge 1 Mobility   During mobility: 95% SpO2  Pt received ambulating in room and agreeable with encouragement. No complaints. Pt declined further mobility for today d/t  getting ready for upcoming discharge. Pt left ambulating in room with all needs met and call bell in reach.   Andrey Campanile Mobility Specialist Please contact via SecureChat or  Rehab office at [phone]

## 2022-08-25 NOTE — Plan of Care (Signed)
  Problem: Education: Goal: Knowledge of risk factors and measures for prevention of condition will improve Outcome: Adequate for Discharge   Problem: Coping: Goal: Psychosocial and spiritual needs will be supported Outcome: Adequate for Discharge   Problem: Respiratory: Goal: Will maintain a patent airway Outcome: Adequate for Discharge Goal: Complications related to the disease process, condition or treatment will be avoided or minimized Outcome: Adequate for Discharge   Problem: Education: Goal: Knowledge of General Education information will improve Description: Including pain rating scale, medication(s)/side effects and non-pharmacologic comfort measures Outcome: Adequate for Discharge   Problem: Health Behavior/Discharge Planning: Goal: Ability to manage health-related needs will improve Outcome: Adequate for Discharge   Problem: Clinical Measurements: Goal: Ability to maintain clinical measurements within normal limits will improve Outcome: Adequate for Discharge Goal: Will remain free from infection Outcome: Adequate for Discharge Goal: Diagnostic test results will improve Outcome: Adequate for Discharge Goal: Respiratory complications will improve Outcome: Adequate for Discharge Goal: Cardiovascular complication will be avoided Outcome: Adequate for Discharge   Problem: Activity: Goal: Risk for activity intolerance will decrease Outcome: Adequate for Discharge   Problem: Nutrition: Goal: Adequate nutrition will be maintained Outcome: Adequate for Discharge   Problem: Coping: Goal: Level of anxiety will decrease Outcome: Adequate for Discharge   Problem: Elimination: Goal: Will not experience complications related to bowel motility Outcome: Adequate for Discharge Goal: Will not experience complications related to urinary retention Outcome: Adequate for Discharge   Problem: Pain Managment: Goal: General experience of comfort will improve Outcome: Adequate  for Discharge   Problem: Safety: Goal: Ability to remain free from injury will improve Outcome: Adequate for Discharge   Problem: Skin Integrity: Goal: Risk for impaired skin integrity will decrease Outcome: Adequate for Discharge   Problem: Education: Goal: Ability to describe self-care measures that may prevent or decrease complications (Diabetes Survival Skills Education) will improve Outcome: Adequate for Discharge Goal: Individualized Educational Video(s) Outcome: Adequate for Discharge   Problem: Coping: Goal: Ability to adjust to condition or change in health will improve Outcome: Adequate for Discharge   Problem: Fluid Volume: Goal: Ability to maintain a balanced intake and output will improve Outcome: Adequate for Discharge   Problem: Health Behavior/Discharge Planning: Goal: Ability to identify and utilize available resources and services will improve Outcome: Adequate for Discharge Goal: Ability to manage health-related needs will improve Outcome: Adequate for Discharge   Problem: Metabolic: Goal: Ability to maintain appropriate glucose levels will improve Outcome: Adequate for Discharge   Problem: Nutritional: Goal: Maintenance of adequate nutrition will improve Outcome: Adequate for Discharge Goal: Progress toward achieving an optimal weight will improve Outcome: Adequate for Discharge   Problem: Skin Integrity: Goal: Risk for impaired skin integrity will decrease Outcome: Adequate for Discharge   Problem: Tissue Perfusion: Goal: Adequacy of tissue perfusion will improve Outcome: Adequate for Discharge   

## 2022-08-25 NOTE — Discharge Summary (Signed)
PATIENT DETAILS Name: Bradley Searles Sr. Age: 81 y.o. Sex: male Date of Birth: [birth year] MRN: [id number]. Admitting Physician: Emeline General, MD PCP:Paz, Nolon Rod, MD  Admit Date: 08/23/2022 Discharge date: 08/25/2022  Recommendations for Outpatient Follow-up:  Follow up with PCP in 1-2 weeks Please obtain CMP/CBC in one week  Admitted From:  Home  Disposition: Home   Discharge Condition: good  CODE STATUS:   Code Status: Full Code   Diet recommendation:  Diet Order             Diet - low sodium heart healthy           Diet Carb Modified           Diet Heart Room service appropriate? Yes; Fluid consistency: Thin  Diet effective now                    Brief Summary: Patient is a 80 y.o.  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   Significant events: 11/13>> admit to [facility]-COPD exacerbation-COVID-19 infection.   Significant studies: 11/13>> CXR: No PNA   Significant microbiology data: 11/13>> influenza PCR: Negative 11/13>> COVID PCR: Positive   Procedures: None   Consults: None  Brief Hospital Course: COPD exacerbation Likely triggered by COVID-19 infection Significantly better-feels that he is almost back to baseline  We will transition to tapering prednisone  Continue usual bronchodilator regimen  Follow with PCP/primary pulmonologist.   COVID-19 infection No PNA on CXR Inflammatory markers not impressive Continue steroids   Chronic HFpEF Euvolemic Continue Farxiga/beta-blocker/diuretics   HTN BP stable Continue amlodipine/bisoprolol   Chronic atrial fibrillation s/p AV nodal ablation and PPM insertion Stable-suspect not on anticoagulation due to history of intracranial bleeding.   DM-2 (A1c 6.2 on 10/9) CBG stable Continue  Farxiga.   Displaced oblique fracture of left third metatarsal shaft of left hand following a mechanical fall Continue splint Per sports medicine-he will follow with them postdischarge for repeat imaging/splint removal.    BMI Estimated body mass index is 25.49 kg/m as calculated from the following:   Height as of this encounter: 5\' 9"  (1.753 m).   Weight as of this encounter: 78.3 kg.     Discharge Diagnoses:  Principal Problem:   COVID Active Problems:   Chronic pulmonary hypertension secondary to elevated L H pressures    COPD exacerbation (HCC)   Acute on chronic respiratory failure with hypoxia [facility])   Discharge Instructions:  Activity:  As tolerated   Discharge Instructions     (HEART FAILURE PATIENTS) Call MD:  Anytime you have any of the following symptoms: 1) 3 pound weight gain in 24 hours or 5 pounds in 1 week 2) shortness of breath, with or without a dry hacking cough 3) swelling in the hands, feet or stomach 4) if you have to sleep on extra pillows at night in order to breathe.   Complete by: As directed    Call MD for:  persistant nausea and vomiting   Complete by: As directed    Diet - low sodium heart healthy   Complete by: As directed    Diet Carb Modified   Complete by: As directed    Discharge instructions   Complete by: As directed    Follow with Primary MD  Bradley Ora  E, MD in 1-2 weeks  Please get a complete blood count and chemistry panel checked by your Primary MD at your next visit, and again as instructed by your Primary MD.  Get Medicines reviewed and adjusted: Please take all your medications with you for your next visit with your Primary MD  Laboratory/radiological data: Please request your Primary MD to go over all hospital tests and procedure/radiological results at the follow up, please ask your Primary MD to get all Hospital records sent to his/her office.  In some cases, they will be blood work, cultures and biopsy results pending at  the time of your discharge. Please request that your primary care M.D. follows up on these results.  Also Note the following: If you experience worsening of your admission symptoms, develop shortness of breath, life threatening emergency, suicidal or homicidal thoughts you must seek medical attention immediately by calling 911 or calling your MD immediately  if symptoms less severe.  You must read complete instructions/literature along with all the possible adverse reactions/side effects for all the Medicines you take and that have been prescribed to you. Take any new Medicines after you have completely understood and accpet all the possible adverse reactions/side effects.   Do not drive when taking Pain medications or sleeping medications (Benzodaizepines)  Do not take more than prescribed Pain, Sleep and Anxiety Medications. It is not advisable to combine anxiety,sleep and pain medications without talking with your primary care practitioner  Special Instructions: If you have smoked or chewed Tobacco  in the last 2 yrs please stop smoking, stop any regular Alcohol  and or any Recreational drug use.  Wear Seat belts while driving.  Please note: You were cared for by a hospitalist during your hospital stay. Once you are discharged, your primary care physician will handle any further medical issues. Please note that NO REFILLS for any discharge medications will be authorized once you are discharged, as it is imperative that you return to your primary care physician (or establish a relationship with a primary care physician if you do not have one) for your post hospital discharge needs so that they can reassess your need for medications and monitor your lab values.   Increase activity slowly   Complete by: As directed       Allergies as of 08/25/2022       Reactions   Hydrocodone Other (See Comments)   "given to him in the hospital; went thru withdrawals once home; dr said not to take it  again" (09/27/2012) dizziness   Ultram [tramadol] Other (See Comments)   "given to him in the hospital; went thru withdrawals once home; dr said not to take it again" (09/27/2012) dizziness   Cialis [tadalafil] Other (See Comments)   Headache Backache  Dizziness    Avelox [moxifloxacin Hydrochloride] Itching, Other (See Comments)   Headache Dizziness        Medication List     TAKE these medications    acetaminophen 500 MG tablet Commonly known as: TYLENOL Take 1,000 mg by mouth as needed for headache or moderate pain.   albuterol 108 (90 Base) MCG/ACT inhaler Commonly known as: VENTOLIN HFA Inhale 2 puffs into the lungs every 6 (six) hours as needed for wheezing or shortness of breath. What changed:  how much to take when to take this   amLODipine 10 MG tablet Commonly known as: NORVASC Take 1 tablet (10 mg total) by mouth daily. What changed: when to take this   aspirin 81  MG tablet Take 81 mg by mouth every morning.   atorvastatin 20 MG tablet Commonly known as: LIPITOR Take 1 tablet (20 mg total) by mouth at bedtime. What changed: when to take this   bisoprolol 10 MG tablet Commonly known as: ZEBETA Take 1 tablet (10 mg total) by mouth daily. What changed: when to take this   budesonide 0.5 MG/2ML nebulizer solution Commonly known as: PULMICORT Take 2 mLs (0.5 mg total) by nebulization 2 (two) times daily.   calcium-vitamin D 500-200 MG-UNIT tablet Commonly known as: OSCAL WITH D Take 1 tablet by mouth every morning.   cholecalciferol 25 MCG (1000 UNIT) tablet Commonly known as: VITAMIN D3 Take 1,000 Units by mouth every evening.   diphenhydramine-acetaminophen 25-500 MG Tabs tablet Commonly known as: TYLENOL PM Take 1 tablet by mouth at bedtime as needed (sleep).   Farxiga 10 MG Tabs tablet Generic drug: dapagliflozin propanediol TAKE 1 TABLET DAILY BEFORE BREAKFAST What changed:  how much to take when to take this   furosemide 40 MG  tablet Commonly known as: LASIX TAKE 2 TABLETS EVERY MORNING AND 1 TABLET LATE IN THE AFTERNOON What changed:  how much to take how to take this when to take this additional instructions   ipratropium-albuterol 0.5-2.5 (3) MG/3ML Soln Commonly known as: DUONEB Take 3 mLs by nebulization every 6 (six) hours as needed. What changed: reasons to take this   multivitamins ther. w/minerals Tabs tablet Take 1 tablet by mouth daily. What changed: when to take this   nitroGLYCERIN 0.4 MG SL tablet Commonly known as: NITROSTAT Place 1 tablet (0.4 mg total) under the tongue every 5 (five) minutes x 3 doses as needed for chest pain.   onetouch ultrasoft lancets Check blood sugars no more than twice daily   OneTouch Verio test strip Generic drug: glucose blood USE TO CHECK BLOOD SUGAR NO MORE THAN TWICE A DAY   pantoprazole 40 MG tablet Commonly known as: PROTONIX Take 1 tablet (40 mg total) by mouth daily before breakfast.   potassium chloride SA 20 MEQ tablet Commonly known as: KLOR-CON M Take 1 tablet (20 mEq total) by mouth daily for 5 doses.   predniSONE 10 MG tablet Commonly known as: DELTASONE Take 40 mg daily for 1 day, 30 mg daily for 1 day, 20 mg daily for 1 days,10 mg daily for 1 day, then stop What changed: additional instructions   tamsulosin 0.4 MG Caps capsule Commonly known as: FLOMAX Take 1 capsule (0.4 mg total) by mouth daily after supper. What changed: when to take this   zolpidem 10 MG tablet Commonly known as: AMBIEN Take 10 mg by mouth at bedtime as needed for sleep.        Follow-up Information     [name], MD. Schedule an appointment as soon as possible for a visit in 1 week(s).   Specialty: Internal Medicine Contact information: [address] [place] [address] [phone]         Nyoka Cowden, MD. Schedule an appointment as soon as possible for a visit in 1 week(s).   Specialty: Pulmonary Disease Contact  information: [address] [place] [address] [phone]         Myra Rude, MD. Schedule an appointment as soon as possible for a visit in 1 week(s).   Specialties: Family Medicine, Emergency Medicine, Sports Medicine Contact information: [address] [place] [address] [phone]  Allergies  Allergen Reactions   Hydrocodone Other (See Comments)    "given to him in the hospital; went thru withdrawals once home; dr said not to take it again" (09/27/2012) dizziness   Ultram [Tramadol] Other (See Comments)    "given to him in the hospital; went thru withdrawals once home; dr said not to take it again" (09/27/2012) dizziness   Cialis [Tadalafil] Other (See Comments)    Headache Backache  Dizziness    Avelox [Moxifloxacin Hydrochloride] Itching and Other (See Comments)    Headache Dizziness     Other Procedures/Studies: DG Chest 2 View  Result Date: 08/23/2022 CLINICAL DATA:  81 year old male with shortness of breath and frequent community-acquired pneumonia is. EXAM: CHEST - 2 VIEW COMPARISON:  Chest radiographs 07/30/2022 and earlier. FINDINGS: PA and lateral views at 0533 hours. Stable left chest dual lead cardiac pacemaker. Stable lung volumes and mediastinal contours. Visualized tracheal air column is within normal limits. No pneumothorax, pulmonary edema or definite pleural effusion. Chronic platelike atelectasis or scarring in the lingula or right middle lobe does not appear significantly changed since February radiographs (lateral). And no acute or confluent pulmonary opacity is identified. Stable visualized osseous structures.  Negative visible bowel gas. IMPRESSION: Stable chronic lung disease. No acute cardiopulmonary abnormality identified. Electronically Signed   By: Odessa Fleming M.D.   On: 08/23/2022 05:47   DG Hand Complete Left  Result Date: 07/31/2022 CLINICAL DATA:  Mechanical fall 2 days ago with  left hand injury while bracing EXAM: LEFT HAND - COMPLETE 3+ VIEW COMPARISON:  None Available. FINDINGS: Fracture: Oblique fracture of the third metacarpal shaft with 4 mm dorsal and 1 cortical shaft with radial displacement of the distal fracture fragment Healing: None. Soft tissue: Normal. IMPRESSION: Mildly displaced oblique fracture of the third metacarpal shaft. Electronically Signed   By: Agustin Cree M.D.   On: 07/31/2022 08:21   DG Chest 2 View  Result Date: 07/30/2022 CLINICAL DATA:  COPD EXAM: CHEST - 2 VIEW COMPARISON:  07/19/2022 FINDINGS: Left-sided implanted cardiac device remains in place. Stable cardiomediastinal contours. Aortic atherosclerosis. Coarsened interstitial markings most pronounced within the bibasilar regions. Overall, degree of interstitial prominence has improved from prior. Small bilateral pleural effusions. No pneumothorax. IMPRESSION: 1. Improving interstitial edema. 2. Small bilateral pleural effusions. Electronically Signed   By: Duanne Guess D.O.   On: 07/30/2022 15:39   CUP PACEART REMOTE DEVICE CHECK  Result Date: 07/29/2022 Scheduled remote reviewed. Normal device function.  The device estimates 3 months until ERI Next remote 08/30/2022. Hassell Halim, RN, CCDS, [employer]    TODAY-DAY OF DISCHARGE:  Subjective:   Bradley [name] today has no headache,no chest abdominal pain,no new weakness tingling or numbness, feels much better wants to go home today.   Objective:   Blood pressure 109/60, pulse 71, temperature 97.8 F (36.6 C), temperature source Oral, resp. rate 14, height  (1.753 m), weight 78.3 kg, SpO2 100 %.  Intake/Output Summary (Last 24 hours) at 08/25/2022 0928 Last data filed at 08/25/2022 0100 Gross per 24 hour  Intake --  Output 1400 ml  Net -1400 ml   Filed Weights   08/23/22 1321  Weight: 78.3 kg    Exam: Awake Alert, Oriented *3, No new F.N deficits, Normal affect [place].AT,PERRAL Supple Neck,No JVD, No cervical  lymphadenopathy appriciated.  Symmetrical Chest wall movement, Good air movement bilaterally, CTAB RRR,No Gallops,Rubs or new Murmurs, No Parasternal Heave +ve B.Sounds, Abd Soft, Non tender, No organomegaly appriciated, No rebound -  guarding or rigidity. No Cyanosis, Clubbing or edema, No new Rash or bruise   PERTINENT RADIOLOGIC STUDIES: No results found.   PERTINENT LAB RESULTS: CBC: Recent Labs    08/24/22 0300 08/25/22 0554  WBC 3.5* 8.0  HGB 14.4 13.5  HCT 42.5 38.7*  PLT 132* 140*   CMET CMP     Component Value Date/Time   NA 138 08/25/2022 0554   NA 144 12/23/2021 1012   K 3.5 08/25/2022 0554   CL 100 08/25/2022 0554   CO2 29 08/25/2022 0554   GLUCOSE 127 (H) 08/25/2022 0554   GLUCOSE 118 (H) 10/18/2006 0000   BUN 17 08/25/2022 0554   BUN 11 12/23/2021 1012   CREATININE 0.85 08/25/2022 0554   CREATININE 0.93 07/28/2020 1026   CALCIUM 8.6 (L) 08/25/2022 0554   PROT 6.1 (L) 08/25/2022 0554   ALBUMIN 3.1 (L) 08/25/2022 0554   AST 18 08/25/2022 0554   ALT 17 08/25/2022 0554   ALKPHOS 63 08/25/2022 0554   BILITOT 0.6 08/25/2022 0554   GFRNONAA >60 08/25/2022 0554   GFRAA >60 09/18/2019 0353    GFR Estimated Creatinine Clearance: 68.2 mL/min (by C-G formula based on SCr of 0.85 mg/dL). No results for input(s): "LIPASE", "AMYLASE" in the last 72 hours. No results for input(s): "CKTOTAL", "CKMB", "CKMBINDEX", "TROPONINI" in the last 72 hours. Invalid input(s): "POCBNP" Recent Labs    08/24/22 0300 08/25/22 0554  DDIMER 0.41 0.31   No results for input(s): "HGBA1C" in the last 72 hours. No results for input(s): "CHOL", "HDL", "LDLCALC", "TRIG", "CHOLHDL", "LDLDIRECT" in the last 72 hours. No results for input(s): "TSH", "T4TOTAL", "T3FREE", "THYROIDAB" in the last 72 hours.  Invalid input(s): "FREET3" Recent Labs    08/23/22 1219 08/24/22 0300  FERRITIN 232 260   Coags: No results for input(s): "INR" in the last 72 hours.  Invalid input(s):  "PT" Microbiology: Recent Results (from the past 240 hour(s))  Resp Panel by RT-PCR (Flu A&B, Covid) Anterior Nasal Swab     Status: Abnormal   Collection Time: 08/23/22  5:17 AM   Specimen: Anterior Nasal Swab  Result Value Ref Range Status   SARS Coronavirus 2 by RT PCR POSITIVE (A) NEGATIVE Final    Comment: (NOTE) SARS-CoV-2 target nucleic acids are DETECTED.  The SARS-CoV-2 RNA is generally detectable in upper respiratory specimens during the acute phase of infection. Positive results are indicative of the presence of the identified virus, but do not rule out bacterial infection or co-infection with other pathogens not detected by the test. Clinical correlation with patient history and other diagnostic information is necessary to determine patient infection status. The expected result is Negative.  Fact Sheet for Patients: BloggerCourse.com  Fact Sheet for Healthcare Providers: SeriousBroker.it  This test is not yet approved or cleared by the [place] FDA and  has been authorized for detection and/or diagnosis of SARS-CoV-2 by FDA under an Emergency Use Authorization (EUA).  This EUA will remain in effect (meaning this test can be used) for the duration of  the COVID-19 declaration under Section 564(b)(1) of the A ct, 21 U.S.C. section 360bbb-3(b)(1), unless the authorization is terminated or revoked sooner.     Influenza A by PCR NEGATIVE NEGATIVE Final   Influenza B by PCR NEGATIVE NEGATIVE Final    Comment: (NOTE) The Xpert Xpress SARS-CoV-2/FLU/RSV plus assay is intended as an aid in the diagnosis of influenza from Nasopharyngeal swab specimens and should not be used as a sole basis for treatment. Nasal washings and  aspirates are unacceptable for Xpert Xpress SARS-CoV-2/FLU/RSV testing.  Fact Sheet for Patients: BloggerCourse.com  Fact Sheet for Healthcare  Providers: SeriousBroker.it  This test is not yet approved or cleared by the [place] FDA and has been authorized for detection and/or diagnosis of SARS-CoV-2 by FDA under an Emergency Use Authorization (EUA). This EUA will remain in effect (meaning this test can be used) for the duration of the COVID-19 declaration under Section 564(b)(1) of the Act, 21 U.S.C. section 360bbb-3(b)(1), unless the authorization is terminated or revoked.  Performed at [facility], [address], [place], [place] [address]     FURTHER DISCHARGE INSTRUCTIONS:  Get Medicines reviewed and adjusted: Please take all your medications with you for your next visit with your Primary MD  Laboratory/radiological data: Please request your Primary MD to go over all hospital tests and procedure/radiological results at the follow up, please ask your Primary MD to get all Hospital records sent to his/her office.  In some cases, they will be blood work, cultures and biopsy results pending at the time of your discharge. Please request that your primary care M.D. goes through all the records of your hospital data and follows up on these results.  Also Note the following: If you experience worsening of your admission symptoms, develop shortness of breath, life threatening emergency, suicidal or homicidal thoughts you must seek medical attention immediately by calling 911 or calling your MD immediately  if symptoms less severe.  You must read complete instructions/literature along with all the possible adverse reactions/side effects for all the Medicines you take and that have been prescribed to you. Take any new Medicines after you have completely understood and accpet all the possible adverse reactions/side effects.   Do not drive when taking Pain medications or sleeping medications (Benzodaizepines)  Do not take more than prescribed Pain, Sleep and Anxiety Medications. It is not  advisable to combine anxiety,sleep and pain medications without talking with your primary care practitioner  Special Instructions: If you have smoked or chewed Tobacco  in the last 2 yrs please stop smoking, stop any regular Alcohol  and or any Recreational drug use.  Wear Seat belts while driving.  Please note: You were cared for by a hospitalist during your hospital stay. Once you are discharged, your primary care physician will handle any further medical issues. Please note that NO REFILLS for any discharge medications will be authorized once you are discharged, as it is imperative that you return to your primary care physician (or establish a relationship with a primary care physician if you do not have one) for your post hospital discharge needs so that they can reassess your need for medications and monitor your lab values.  Total Time spent coordinating discharge including counseling, education and face to face time equals greater than 30 minutes.  SignedJeoffrey Massed 08/25/2022 9:28 AM

## 2022-08-26 ENCOUNTER — Ambulatory Visit (INDEPENDENT_AMBULATORY_CARE_PROVIDER_SITE_OTHER): Payer: BLUE CROSS/BLUE SHIELD | Admitting: Pharmacist

## 2022-08-26 ENCOUNTER — Telehealth (HOSPITAL_BASED_OUTPATIENT_CLINIC_OR_DEPARTMENT_OTHER): Payer: Self-pay | Admitting: *Deleted

## 2022-08-26 ENCOUNTER — Telehealth: Payer: Self-pay

## 2022-08-26 DIAGNOSIS — I5032 Chronic diastolic (congestive) heart failure: Secondary | ICD-10-CM

## 2022-08-26 DIAGNOSIS — I10 Essential (primary) hypertension: Secondary | ICD-10-CM

## 2022-08-26 MED ORDER — BISOPROLOL FUMARATE 10 MG PO TABS: 10.0000 mg | ORAL_TABLET | Freq: Every day | ORAL | 0 refills | Status: DC

## 2022-08-26 NOTE — Telephone Encounter (Signed)
FARXIGA PA submitted, see below for response  Information regarding your request Drug is covered by current benefit plan. No further PA activity needed

## 2022-08-26 NOTE — Telephone Encounter (Signed)
Transition Care Management Unsuccessful Follow-up Telephone Call  Date of discharge and from where:  TCM DC [facility] 08-25-22 Dx: COVID  Attempts:  1st Attempt  Reason for unsuccessful TCM follow-up call:  Left voice message   Woodfin Ganja LPN [facility] Nurse Health Advisor Direct Dial [phone]

## 2022-08-26 NOTE — Progress Notes (Addendum)
Pharmacy Note  08/26/2022 Name: Bradley BRANIFF Sr. MRN: [id number] DOB: 06/18/1941  Subjective: Bradley Searles Sr. is a 81 y.o. year old male who is a primary care patient of [name], MD. Clinical Pharmacist Practitioner referral was placed to assist with medication management.    Engaged with patient by telephone for follow up visit today.  Patient was released from [facility] 08/25/2022. Hospitalized 08/23/2022 to 08/25/2022 for COPD exacerbation and COVID 19 infection. Patient confirmed he had picked up and started prednisone 10mg  - take 40mg  daily for 1 day, 30mg  for 1 day, 20mg  for 1 day, 10mg  for 1 days then stop.  COPD - using budesonide nebs twice a day and Duonebs twice a day currently. Report breathing since returning home from hospital has been good. He also had albuterol nebs to use if needed.   Hypertension / CHF - patient's blood pressure was good during hospitalization 131/83 on 08/24/2022. His med list in hospital and current EPIC med list has the follow up blood pressure meds - amlodipine 10mg  daily and bisoprolol 10mg  daily. However patient did not recall bisoprolol. Looks like it was started 07/23/2022 and only filled once for 30 days (has 1 refill on Rx). He is also taking 10mg  daily and furosemide 40mg  each morning.     Objective: Review of patient status, including review of consultants reports, laboratory and other test data, was performed as part of comprehensive evaluation and provision of chronic care management services.   Lab Results  Component Value Date   CREATININE 0.85 08/25/2022   CREATININE 0.77 08/24/2022   CREATININE 0.81 08/23/2022    Lab Results  Component Value Date   HGBA1C 6.2 (H) 07/19/2022       Component Value Date/Time   CHOL 141 11/11/2021 0928   TRIG 148.0 11/11/2021 0928   HDL 41.10 11/11/2021 0928   CHOLHDL 3 11/11/2021 0928   VLDL 29.6 11/11/2021 0928   LDLCALC 70 11/11/2021 0928     Clinical ASCVD: Yes   The ASCVD Risk score (Arnett DK, et al., 2019) failed to calculate for the following reasons:   The 2019 ASCVD risk score is only valid for ages 46 to 9    BP Readings from Last 3 Encounters:  08/25/22 109/60  08/09/22 136/80  08/03/22 100/60     Allergies  Allergen Reactions   Hydrocodone Other (See Comments)    "given to him in the hospital; went thru withdrawals once home; dr said not to take it again" (09/27/2012) dizziness   Ultram [Tramadol] Other (See Comments)    "given to him in the hospital; went thru withdrawals once home; dr said not to take it again" (09/27/2012) dizziness   Cialis [Tadalafil] Other (See Comments)    Headache Backache  Dizziness    Avelox [Moxifloxacin Hydrochloride] Itching and Other (See Comments)    Headache Dizziness    Medications Reviewed Today     Reviewed by 2020, RPH-CPP (Pharmacist) on 08/26/22 at 1040  Med List Status: <None>   Medication Order Taking? Sig Documenting Provider Last Dose Status Informant  acetaminophen (TYLENOL) 500 MG tablet 41  Take 1,000 mg by mouth as needed for headache or moderate pain. [provider]  Active Self  albuterol (VENTOLIN HFA) 108 (90 Base) MCG/ACT inhaler 76 Yes Inhale 2 puffs into the lungs every 6 (six) hours as needed for wheezing or shortness of breath.  Patient taking differently: Inhale 1 puff into the lungs 2 (two)  times daily.   Maretta Bees, MD Taking Active Self, Pharmacy Records  amLODipine (NORVASC) 10 MG tablet [id number] Yes Take 1 tablet (10 mg total) by mouth daily.  Patient taking differently: Take 10 mg by mouth in the morning.   [name], MD Taking Active Self, Pharmacy Records  aspirin 81 MG tablet [id number] Yes Take 81 mg by mouth every morning.  [provider] Taking Active Self  atorvastatin (LIPITOR) 20 MG tablet [id number] Yes Take 1 tablet (20 mg total) by mouth at bedtime.  Patient taking differently: Take 20 mg by mouth  every evening.   [name], MD Taking Active Self, Pharmacy Records  bisoprolol (ZEBETA) 10 MG tablet [id number] No Take 1 tablet (10 mg total) by mouth daily.  Patient not taking: Reported on 08/26/2022   Cipriano Bunker, MD Not Taking Active Self, Pharmacy Records  budesonide (PULMICORT) 0.5 MG/2ML nebulizer solution [id number] Yes Take 2 mLs (0.5 mg total) by nebulization 2 (two) times daily. [name], MD Taking Active Self, Pharmacy Records  calcium-vitamin D (OSCAL WITH D) 500-200 MG-UNIT tablet [id number]  Take 1 tablet by mouth every morning. [provider]  Active Self  cholecalciferol (VITAMIN D3) 25 MCG (1000 UNIT) tablet [id number]  Take 1,000 Units by mouth every evening. [provider]  Active Self  diphenhydramine-acetaminophen (TYLENOL PM) 25-500 MG TABS tablet [id number]  Take 1 tablet by mouth at bedtime as needed (sleep). [provider]  Active Self  FARXIGA 10 MG TABS tablet [id number] Yes TAKE 1 TABLET DAILY BEFORE BREAKFAST  Patient taking differently: Take 10 mg by mouth in the morning.   [name], MD Taking Active Self, Pharmacy Records  furosemide (LASIX) 40 MG tablet [id number] Yes TAKE 2 TABLETS EVERY MORNING AND 1 TABLET LATE IN THE AFTERNOON  Patient taking differently: Take 40 mg by mouth in the morning.   [name], MD Taking Active Self, Pharmacy Records  ipratropium-albuterol (DUONEB) 0.5-2.5 (3) MG/3ML SOLN [id number] Yes Take 3 mLs by nebulization every 6 (six) hours as needed.  Patient taking differently: Take 3 mLs by nebulization in the morning and at bedtime.   [name], MD Taking Active Self, Pharmacy Records  Lancets Bradley [name], Bradley [name] ULTRASOFT) lancets [phone]  Check blood sugars no more than twice daily [name], MD  Active Self  Multiple Vitamins-Minerals (MULTIVITAMINS THER. W/MINERALS) TABS tablet [id number]  Take 1 tablet by mouth daily.  Patient taking differently: Take 1 tablet by mouth in the morning.   [name], MD  Active Self  nitroGLYCERIN (NITROSTAT) 0.4 MG SL tablet [id number]  Place 1 tablet (0.4 mg total) under the tongue every 5 (five) minutes x 3 doses as needed for chest pain. [name], MD  Active Self, Pharmacy Records           Med Note Bradley [name], Bradley [name] Jul 19, 2022  7:17 AM)    Letta Pate VERIO test strip [id number]  USE TO CHECK BLOOD SUGAR NO MORE THAN TWICE A DAY Paz, Nolon Rod, MD  Active Self  pantoprazole (PROTONIX) 40 MG tablet [id number] Yes Take 1 tablet (40 mg total) by mouth daily before breakfast. [name], MD Taking Active Self, Pharmacy Records  predniSONE (DELTASONE) 10 MG tablet [id number] Yes Take 40 mg daily for 1 day, 30 mg daily for 1 day, 20 mg daily for 1 days,10 mg daily for 1 day, then stop Maretta Bees, MD Taking Active   tamsulosin (  FLOMAX) 0.4 MG CAPS capsule [id number] Yes Take 1 capsule (0.4 mg total) by mouth daily after supper. [name], MD Taking Active Self, Pharmacy Records  zolpidem [name]) 10 MG tablet [id number] Yes Take 10 mg by mouth at bedtime as needed for sleep. [provider] Taking Active Self, Pharmacy Records           Med Note (COFFELL, Foye Spurling Aug 23, 2022  9:52 AM) Pt states he still takes this medication as needed. No fill history per dispense report/Dr. First. Per PMP, LF 05/16/2019 #45, 90 DS.            Patient Active Problem List   Diagnosis Date Noted   COVID 08/23/2022   Displaced fracture of shaft of third metacarpal bone, left hand, initial encounter for closed fracture 08/03/2022   ILD (interstitial lung disease) (HCC) 07/30/2022   CAD (coronary artery disease) 07/19/2022   Acute on chronic diastolic heart failure (HCC) 07/18/2022   Acute on chronic respiratory failure with hypoxia (HCC) 07/18/2022   GERD without esophagitis 07/30/2021   Cor pulmonale (chronic) (HCC) 08/05/2020   COPD exacerbation (HCC) 12/22/2018   COPD GOLD 0 spirometry/ AB  12/22/2018   PCP NOTES >>> 07/15/2015   Annual  physical exam 05/08/2012   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   Sinoatrial node dysfunction (HCC)    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   Chronic diastolic heart failure (HCC)    PAD (peripheral artery disease) (HCC) 10/22/2010   DERMATITIS, STASIS 09/14/2010   Chronic respiratory failure with hypoxia and hypercapnia (HCC) 09/14/2010   Chronic pulmonary hypertension secondary to elevated L H pressures  04/15/2009   ERECTILE DYSFUNCTION 01/06/2009   Mixed diabetic hyperlipidemia associated with type 2 diabetes mellitus (HCC) 12/13/2007   Essential hypertension 12/13/2007   Anxiety--insomnia 08/14/2007   DM (diabetes mellitus) (HCC) 01/02/2007   Atrial fibrillation (HCC) 01/02/2007   Benign enlargement of prostate 01/02/2007     Medication Assistance:  None required.  Patient affirms current coverage meets needs.   Assessment / Plan: Hypertension / CHF  Restart bisoprolol 10mg  daily - sent in updated Rx.  Continue farxiga and furosemide for CHF Continue amlodipine 10mg  daily for blood pressure - monitor for edema  COPD:  Continue budesonide and DuoNeb nebs twice a daily.  Reminded to complete course of prednisone  Follow up with PCP as planned 08/30/2022.   Follow Up:  Telephone follow up appointment with care management team member scheduled for:  2 to 3 months   , Bradley Clinical Pharmacist [facility]  - [name] [phone]  I have personally reviewed this encounter including the documentation in this note and have collaborated with the care management provider regarding care management and care coordination activities to include development and update of the comprehensive care plan. I am certifying that I agree with the content of this note and encounter as supervising physician.  [facility], MD

## 2022-08-27 ENCOUNTER — Other Ambulatory Visit: Payer: Self-pay

## 2022-08-27 ENCOUNTER — Telehealth: Payer: Self-pay

## 2022-08-27 ENCOUNTER — Inpatient Hospital Stay (HOSPITAL_COMMUNITY)
Admission: EM | Admit: 2022-08-27 | Discharge: 2022-08-30 | DRG: 190 | Disposition: A | Discharge status: Home / Self Care | Payer: Medicare Other | Attending: Family Medicine | Admitting: Family Medicine

## 2022-08-27 ENCOUNTER — Encounter (HOSPITAL_COMMUNITY): Payer: Self-pay

## 2022-08-27 ENCOUNTER — Emergency Department (HOSPITAL_COMMUNITY): Payer: Medicare Other

## 2022-08-27 DIAGNOSIS — I495 Sick sinus syndrome: Secondary | ICD-10-CM | POA: Diagnosis present

## 2022-08-27 DIAGNOSIS — J9621 Acute and chronic respiratory failure with hypoxia: Secondary | ICD-10-CM | POA: Diagnosis present

## 2022-08-27 DIAGNOSIS — E785 Hyperlipidemia, unspecified: Secondary | ICD-10-CM | POA: Diagnosis present

## 2022-08-27 DIAGNOSIS — Z833 Family history of diabetes mellitus: Secondary | ICD-10-CM

## 2022-08-27 DIAGNOSIS — N400 Enlarged prostate without lower urinary tract symptoms: Secondary | ICD-10-CM | POA: Diagnosis present

## 2022-08-27 DIAGNOSIS — I272 Pulmonary hypertension, unspecified: Secondary | ICD-10-CM | POA: Diagnosis present

## 2022-08-27 DIAGNOSIS — I110 Hypertensive heart disease with heart failure: Secondary | ICD-10-CM | POA: Diagnosis present

## 2022-08-27 DIAGNOSIS — U071 COVID-19: Secondary | ICD-10-CM | POA: Diagnosis present

## 2022-08-27 DIAGNOSIS — E782 Mixed hyperlipidemia: Secondary | ICD-10-CM | POA: Diagnosis present

## 2022-08-27 DIAGNOSIS — K219 Gastro-esophageal reflux disease without esophagitis: Secondary | ICD-10-CM | POA: Diagnosis present

## 2022-08-27 DIAGNOSIS — I739 Peripheral vascular disease, unspecified: Secondary | ICD-10-CM | POA: Diagnosis present

## 2022-08-27 DIAGNOSIS — I5032 Chronic diastolic (congestive) heart failure: Secondary | ICD-10-CM | POA: Diagnosis present

## 2022-08-27 DIAGNOSIS — Z9981 Dependence on supplemental oxygen: Secondary | ICD-10-CM

## 2022-08-27 DIAGNOSIS — Z888 Allergy status to other drugs, medicaments and biological substances status: Secondary | ICD-10-CM

## 2022-08-27 DIAGNOSIS — J9612 Chronic respiratory failure with hypercapnia: Secondary | ICD-10-CM | POA: Diagnosis not present

## 2022-08-27 DIAGNOSIS — J441 Chronic obstructive pulmonary disease with (acute) exacerbation: Secondary | ICD-10-CM | POA: Diagnosis present

## 2022-08-27 DIAGNOSIS — J9601 Acute respiratory failure with hypoxia: Secondary | ICD-10-CM | POA: Diagnosis not present

## 2022-08-27 DIAGNOSIS — I10 Essential (primary) hypertension: Secondary | ICD-10-CM | POA: Diagnosis not present

## 2022-08-27 DIAGNOSIS — I428 Other cardiomyopathies: Secondary | ICD-10-CM | POA: Diagnosis present

## 2022-08-27 DIAGNOSIS — I2781 Cor pulmonale (chronic): Secondary | ICD-10-CM | POA: Diagnosis present

## 2022-08-27 DIAGNOSIS — G4700 Insomnia, unspecified: Secondary | ICD-10-CM | POA: Diagnosis present

## 2022-08-27 DIAGNOSIS — Z7951 Long term (current) use of inhaled steroids: Secondary | ICD-10-CM

## 2022-08-27 DIAGNOSIS — F419 Anxiety disorder, unspecified: Secondary | ICD-10-CM | POA: Diagnosis present

## 2022-08-27 DIAGNOSIS — E1151 Type 2 diabetes mellitus with diabetic peripheral angiopathy without gangrene: Secondary | ICD-10-CM | POA: Diagnosis present

## 2022-08-27 DIAGNOSIS — Z87891 Personal history of nicotine dependence: Secondary | ICD-10-CM

## 2022-08-27 DIAGNOSIS — E876 Hypokalemia: Secondary | ICD-10-CM | POA: Diagnosis present

## 2022-08-27 DIAGNOSIS — Z8249 Family history of ischemic heart disease and other diseases of the circulatory system: Secondary | ICD-10-CM

## 2022-08-27 DIAGNOSIS — E119 Type 2 diabetes mellitus without complications: Secondary | ICD-10-CM

## 2022-08-27 DIAGNOSIS — E1169 Type 2 diabetes mellitus with other specified complication: Secondary | ICD-10-CM | POA: Diagnosis present

## 2022-08-27 DIAGNOSIS — Z950 Presence of cardiac pacemaker: Secondary | ICD-10-CM

## 2022-08-27 DIAGNOSIS — I482 Chronic atrial fibrillation, unspecified: Secondary | ICD-10-CM | POA: Diagnosis present

## 2022-08-27 DIAGNOSIS — Z881 Allergy status to other antibiotic agents status: Secondary | ICD-10-CM

## 2022-08-27 DIAGNOSIS — J189 Pneumonia, unspecified organism: Secondary | ICD-10-CM | POA: Diagnosis present

## 2022-08-27 DIAGNOSIS — M1990 Unspecified osteoarthritis, unspecified site: Secondary | ICD-10-CM | POA: Diagnosis present

## 2022-08-27 DIAGNOSIS — J9622 Acute and chronic respiratory failure with hypercapnia: Secondary | ICD-10-CM | POA: Diagnosis present

## 2022-08-27 DIAGNOSIS — J9611 Chronic respiratory failure with hypoxia: Secondary | ICD-10-CM | POA: Diagnosis not present

## 2022-08-27 DIAGNOSIS — I4891 Unspecified atrial fibrillation: Secondary | ICD-10-CM | POA: Diagnosis present

## 2022-08-27 DIAGNOSIS — Z79899 Other long term (current) drug therapy: Secondary | ICD-10-CM

## 2022-08-27 DIAGNOSIS — J440 Chronic obstructive pulmonary disease with acute lower respiratory infection: Secondary | ICD-10-CM | POA: Diagnosis present

## 2022-08-27 DIAGNOSIS — Z7982 Long term (current) use of aspirin: Secondary | ICD-10-CM

## 2022-08-27 DIAGNOSIS — Z885 Allergy status to narcotic agent status: Secondary | ICD-10-CM

## 2022-08-27 DIAGNOSIS — I2510 Atherosclerotic heart disease of native coronary artery without angina pectoris: Secondary | ICD-10-CM | POA: Diagnosis present

## 2022-08-27 LAB — PROCALCITONIN: Procalcitonin: <0.1 ng/mL

## 2022-08-27 LAB — GLUCOSE, CAPILLARY
Glucose-Capillary: 226 mg/dL — ABNORMAL HIGH (ref 70–99)
Glucose-Capillary: 231 mg/dL — ABNORMAL HIGH (ref 70–99)
Glucose-Capillary: 200 mg/dL — ABNORMAL HIGH (ref 70–99)

## 2022-08-27 LAB — COMPREHENSIVE METABOLIC PANEL
ALT: 19 U/L (ref 0–44)
AST: 23 U/L (ref 15–41)
Albumin: 3.4 g/dL — ABNORMAL LOW (ref 3.5–5.0)
Alkaline Phosphatase: 62 U/L (ref 38–126)
Anion gap: 8 (ref 5–15)
BUN: 20 mg/dL (ref 8–23)
CO2: 32 mmol/L (ref 22–32)
Calcium: 8.4 mg/dL — ABNORMAL LOW (ref 8.9–10.3)
Chloride: 100 mmol/L (ref 98–111)
Creatinine, Ser: 0.79 mg/dL (ref 0.61–1.24)
GFR, Estimated: >60 mL/min (ref >60)
Glucose, Bld: 100 mg/dL — ABNORMAL HIGH (ref 70–99)
Potassium: 3.2 mmol/L — ABNORMAL LOW (ref 3.5–5.1)
Sodium: 140 mmol/L (ref 135–145)
Total Bilirubin: 0.8 mg/dL (ref 0.3–1.2)
Total Protein: 6.6 g/dL (ref 6.5–8.1)

## 2022-08-27 LAB — CBC WITH DIFFERENTIAL/PLATELET
Abs Immature Granulocytes: 0.08 10*3/uL — ABNORMAL HIGH (ref 0.00–0.07)
Basophils Absolute: 0 10*3/uL (ref 0.0–0.1)
Basophils Relative: 0 %
Eosinophils Absolute: 0 10*3/uL (ref 0.0–0.5)
Eosinophils Relative: 0 %
HCT: 40.3 % (ref 39.0–52.0)
Hemoglobin: 13.3 g/dL (ref 13.0–17.0)
Immature Granulocytes: 1 %
Lymphocytes Relative: 15 %
Lymphs Abs: 1.1 10*3/uL (ref 0.7–4.0)
MCH: 30.9 pg (ref 26.0–34.0)
MCHC: 33 g/dL (ref 30.0–36.0)
MCV: 93.5 fL (ref 80.0–100.0)
Monocytes Absolute: 0.6 10*3/uL (ref 0.1–1.0)
Monocytes Relative: 8 %
Neutro Abs: 5.3 10*3/uL (ref 1.7–7.7)
Neutrophils Relative %: 76 %
Platelets: 152 10*3/uL (ref 150–400)
RBC: 4.31 MIL/uL (ref 4.22–5.81)
RDW: 14.6 % (ref 11.5–15.5)
WBC: 7 10*3/uL (ref 4.0–10.5)
nRBC: 0 % (ref 0.0–0.2)

## 2022-08-27 LAB — BLOOD GAS, VENOUS
Acid-Base Excess: 12.3 mmol/L — ABNORMAL HIGH (ref 0.0–2.0)
Bicarbonate: 38.4 mmol/L — ABNORMAL HIGH (ref 20.0–28.0)
O2 Saturation: 39.7 %
Patient temperature: 37
pCO2, Ven: 54 mmHg (ref 44–60)
pH, Ven: 7.46 — ABNORMAL HIGH (ref 7.25–7.43)
pO2, Ven: <31 mmHg — CL (ref 32–45)

## 2022-08-27 LAB — EXPECTORATED SPUTUM ASSESSMENT W GRAM STAIN, RFLX TO RESP C

## 2022-08-27 LAB — PHOSPHORUS: Phosphorus: 1.8 mg/dL — ABNORMAL LOW (ref 2.5–4.6)

## 2022-08-27 LAB — MAGNESIUM: Magnesium: 2.2 mg/dL (ref 1.7–2.4)

## 2022-08-27 LAB — STREP PNEUMONIAE URINARY ANTIGEN: Strep Pneumo Urinary Antigen: NEGATIVE

## 2022-08-27 LAB — BRAIN NATRIURETIC PEPTIDE: B Natriuretic Peptide: 283.3 pg/mL — ABNORMAL HIGH (ref 0.0–100.0)

## 2022-08-27 MED ORDER — ORAL CARE MOUTH RINSE: 15.0000 mL | OROMUCOSAL | Status: DC | PRN

## 2022-08-27 MED ORDER — GUAIFENESIN ER 600 MG PO TB12
600.0000 mg | ORAL_TABLET | Freq: Two times a day (BID) | ORAL | Status: DC
  Administered 2022-08-27 – 2022-08-30 (×7): 600 mg via ORAL
  Filled 2022-08-27 (×7): qty 1

## 2022-08-27 MED ORDER — PREDNISONE 20 MG PO TABS
40.0000 mg | ORAL_TABLET | Freq: Every day | ORAL | Status: DC
  Administered 2022-08-28 – 2022-08-30 (×3): 40 mg via ORAL
  Filled 2022-08-27 (×3): qty 2

## 2022-08-27 MED ORDER — POTASSIUM CHLORIDE CRYS ER 20 MEQ PO TBCR
40.0000 meq | EXTENDED_RELEASE_TABLET | ORAL | Status: AC
  Administered 2022-08-27 (×2): 40 meq via ORAL
  Filled 2022-08-27 (×2): qty 2

## 2022-08-27 MED ORDER — ENOXAPARIN SODIUM 40 MG/0.4ML IJ SOSY
40.0000 mg | PREFILLED_SYRINGE | INTRAMUSCULAR | Status: DC
  Administered 2022-08-27: 40 mg via SUBCUTANEOUS
  Filled 2022-08-27: qty 0.4

## 2022-08-27 MED ORDER — AMLODIPINE BESYLATE 10 MG PO TABS
10.0000 mg | ORAL_TABLET | Freq: Every day | ORAL | Status: DC
  Administered 2022-08-27 – 2022-08-29 (×3): 10 mg via ORAL
  Filled 2022-08-27 (×4): qty 1

## 2022-08-27 MED ORDER — ASPIRIN 81 MG PO TBEC
81.0000 mg | DELAYED_RELEASE_TABLET | Freq: Every morning | ORAL | Status: DC
  Administered 2022-08-27 – 2022-08-30 (×4): 81 mg via ORAL
  Filled 2022-08-27 (×4): qty 1

## 2022-08-27 MED ORDER — POTASSIUM CHLORIDE CRYS ER 20 MEQ PO TBCR
40.0000 meq | EXTENDED_RELEASE_TABLET | Freq: Every day | ORAL | Status: DC
  Administered 2022-08-28 – 2022-08-30 (×3): 40 meq via ORAL
  Filled 2022-08-27 (×3): qty 2

## 2022-08-27 MED ORDER — TAMSULOSIN HCL 0.4 MG PO CAPS
0.4000 mg | ORAL_CAPSULE | Freq: Every day | ORAL | Status: DC
  Administered 2022-08-27 – 2022-08-29 (×3): 0.4 mg via ORAL
  Filled 2022-08-27 (×3): qty 1

## 2022-08-27 MED ORDER — K PHOS MONO-SOD PHOS DI & MONO 155-852-130 MG PO TABS
500.0000 mg | ORAL_TABLET | Freq: Three times a day (TID) | ORAL | Status: AC
  Administered 2022-08-27 – 2022-08-28 (×4): 500 mg via ORAL
  Filled 2022-08-27 (×4): qty 2

## 2022-08-27 MED ORDER — ALBUTEROL SULFATE (2.5 MG/3ML) 0.083% IN NEBU
2.5000 mg | INHALATION_SOLUTION | RESPIRATORY_TRACT | Status: DC | PRN
  Administered 2022-08-29: 2.5 mg via RESPIRATORY_TRACT
  Filled 2022-08-27: qty 3

## 2022-08-27 MED ORDER — VITAMIN D 25 MCG (1000 UNIT) PO TABS
1000.0000 [IU] | ORAL_TABLET | Freq: Every evening | ORAL | Status: DC
  Administered 2022-08-27 – 2022-08-29 (×3): 1000 [IU] via ORAL
  Filled 2022-08-27 (×3): qty 1

## 2022-08-27 MED ORDER — IPRATROPIUM-ALBUTEROL 0.5-2.5 (3) MG/3ML IN SOLN
3.0000 mL | Freq: Once | RESPIRATORY_TRACT | Status: AC
  Administered 2022-08-27: 3 mL via RESPIRATORY_TRACT
  Filled 2022-08-27: qty 3

## 2022-08-27 MED ORDER — PANTOPRAZOLE SODIUM 40 MG PO TBEC
40.0000 mg | DELAYED_RELEASE_TABLET | Freq: Every day | ORAL | Status: DC
  Administered 2022-08-27 – 2022-08-30 (×4): 40 mg via ORAL
  Filled 2022-08-27 (×4): qty 1

## 2022-08-27 MED ORDER — MAGNESIUM SULFATE 2 GM/50ML IV SOLN
2.0000 g | Freq: Once | INTRAVENOUS | Status: AC
  Administered 2022-08-27: 2 g via INTRAVENOUS
  Filled 2022-08-27: qty 50

## 2022-08-27 MED ORDER — SODIUM CHLORIDE 0.9 % IV SOLN
2.0000 g | Freq: Every day | INTRAVENOUS | Status: DC
  Administered 2022-08-27 – 2022-08-30 (×4): 2 g via INTRAVENOUS
  Filled 2022-08-27 (×4): qty 20

## 2022-08-27 MED ORDER — ATORVASTATIN CALCIUM 20 MG PO TABS
20.0000 mg | ORAL_TABLET | Freq: Every evening | ORAL | Status: DC
  Administered 2022-08-27 – 2022-08-29 (×3): 20 mg via ORAL
  Filled 2022-08-27 (×3): qty 1

## 2022-08-27 MED ORDER — FUROSEMIDE 40 MG PO TABS
40.0000 mg | ORAL_TABLET | Freq: Every day | ORAL | Status: DC
  Administered 2022-08-27 – 2022-08-30 (×4): 40 mg via ORAL
  Filled 2022-08-27 (×4): qty 1

## 2022-08-27 MED ORDER — AZITHROMYCIN 250 MG PO TABS
500.0000 mg | ORAL_TABLET | Freq: Every day | ORAL | Status: DC
  Administered 2022-08-28 – 2022-08-30 (×3): 500 mg via ORAL
  Filled 2022-08-27 (×3): qty 2

## 2022-08-27 MED ORDER — NITROGLYCERIN 0.4 MG SL SUBL
0.4000 mg | SUBLINGUAL_TABLET | SUBLINGUAL | Status: DC | PRN
  Administered 2022-08-30: 0.4 mg via SUBLINGUAL
  Filled 2022-08-27: qty 1

## 2022-08-27 MED ORDER — IPRATROPIUM-ALBUTEROL 0.5-2.5 (3) MG/3ML IN SOLN
3.0000 mL | Freq: Four times a day (QID) | RESPIRATORY_TRACT | Status: DC
  Administered 2022-08-27 – 2022-08-29 (×10): 3 mL via RESPIRATORY_TRACT
  Filled 2022-08-27 (×10): qty 3

## 2022-08-27 MED ORDER — METHYLPREDNISOLONE SODIUM SUCC 125 MG IJ SOLR
125.0000 mg | Freq: Once | INTRAMUSCULAR | Status: AC
  Administered 2022-08-27: 125 mg via INTRAVENOUS
  Filled 2022-08-27: qty 2

## 2022-08-27 MED ORDER — BISOPROLOL FUMARATE 5 MG PO TABS
10.0000 mg | ORAL_TABLET | Freq: Every day | ORAL | Status: DC
  Administered 2022-08-27 – 2022-08-30 (×4): 10 mg via ORAL
  Filled 2022-08-27 (×4): qty 2

## 2022-08-27 MED ORDER — DAPAGLIFLOZIN PROPANEDIOL 10 MG PO TABS
10.0000 mg | ORAL_TABLET | Freq: Every day | ORAL | Status: DC
  Administered 2022-08-27 – 2022-08-29 (×3): 10 mg via ORAL
  Filled 2022-08-27 (×5): qty 1

## 2022-08-27 MED ORDER — INSULIN ASPART 100 UNIT/ML IJ SOLN
0.0000 [IU] | Freq: Three times a day (TID) | INTRAMUSCULAR | Status: DC
  Administered 2022-08-27 (×2): 5 [IU] via SUBCUTANEOUS
  Administered 2022-08-28: 4 [IU] via SUBCUTANEOUS
  Administered 2022-08-28: 3 [IU] via SUBCUTANEOUS
  Administered 2022-08-28: 2 [IU] via SUBCUTANEOUS
  Administered 2022-08-29: 3 [IU] via SUBCUTANEOUS
  Administered 2022-08-29: 2 [IU] via SUBCUTANEOUS
  Administered 2022-08-30: 5 [IU] via SUBCUTANEOUS

## 2022-08-27 MED ORDER — IPRATROPIUM BROMIDE 0.02 % IN SOLN
0.5000 mg | Freq: Once | RESPIRATORY_TRACT | Status: AC
  Administered 2022-08-27: 0.5 mg via RESPIRATORY_TRACT
  Filled 2022-08-27: qty 2.5

## 2022-08-27 MED ORDER — SODIUM CHLORIDE 0.9 % IV SOLN
500.0000 mg | Freq: Once | INTRAVENOUS | Status: AC
  Administered 2022-08-27: 500 mg via INTRAVENOUS
  Filled 2022-08-27: qty 5

## 2022-08-27 MED ORDER — ALBUTEROL SULFATE (2.5 MG/3ML) 0.083% IN NEBU
10.0000 mg/h | INHALATION_SOLUTION | RESPIRATORY_TRACT | Status: DC
  Administered 2022-08-27: 10 mg/h via RESPIRATORY_TRACT
  Filled 2022-08-27: qty 12

## 2022-08-27 NOTE — ED Triage Notes (Signed)
BIBA from home for [facility] for last 2 hours, on 4 LNC at home but required non-rebreather by EMS, was at 76% on 4 LNC at home, duo neb txt prior to hospital

## 2022-08-27 NOTE — H&P (Signed)
History and Physical    Patient: Bradley [name] KGU:[id number] DOB: 02-03-1941 DOA: 08/27/2022 DOS: the patient was seen and examined on 08/27/2022 PCP: [name], MD  Patient coming from: Home  Chief Complaint:  Chief Complaint  Patient presents with   Shortness of Breath   HPI: Bradley [name]. is a 81 y.o. male with medical history significant of intermittent anemia, osteoarthritis of the back, chronic atrial fibrillation, history of AV nodal ablation and biventricular PPM, BPH, bronchitis, CAD, chronic heart failure, pulmonary hypertension, COPD, chronic respiratory failure on home oxygen at 2 LPM, hyperlipidemia, hypertension, history intracranial bleed, migraine headaches, insomnia, history of pleural effusion with decortication procedure, history of pneumonia, type 2 diabetes who was recently admitted and discharged from 08/23/2022 until 08/25/2022 due to COPD exacerbation in the setting of acute COVID-19 illness who is returning to the emergency department due to significantly increased dyspnea since early morning today.  EMS was called.  He thought both his oxygen supplementation from 2-4 LPM, but EMS found his O2 sat was 76% on their initial assessment.  They gave him a DuoNeb prior to arrival to the hospital.  He thinks he may have been mildly febrile and had a night sweat.  He is still coughing mostly whitish, but occasionally greenish sputum.  Occasionally he gets nauseous when coughing.  He has no pleuritic chest pain, but some soreness of his abdominal muscles. He denied rhinorrhea, sore throat or hemoptysis.  No chest pain, palpitations or pitting edema of the lower extremities.  He felt mildly orthopneic this morning. No basilar abdominal pain, emesis, diarrhea, constipation, melena or hematochezia.  No flank pain, dysuria, frequency or hematuria.  No polyuria, polydipsia, polyphagia or blurred vision.   ED course: Initial vital signs were temperature 99.1 F, pulse 72,  respirations 31, BP 136/106 mmHg O2 sat 97% on nasal cannula oxygen at 2 LPM.  He received methylprednisolone 125 mg IVP, magnesium sulfate 2 g IVPB, a DuoNeb, azithromycin 500 mg IVPB x1 and a continuous albuterol 10+ ipratropium 0.5 neb.  Lab work: His CBC was normal with a white count of 7.0 and 76% neutrophils, hemoglobin 13.3 g/dL and platelets 628.  Venous blood gas showed a pH of 7.46, PCO2 of 54 and a decreased PO2 at less than 31 mmHg.  Bicarbonate was 38.4 and acid-based excess was 12.3 mmol/L.  CMP showed potassium of 3.2 mmol/L and glucose of 100 mg/deciliter.  Albumin was 3.4 g/dL.  The rest of the CMP measurements were normal after calcium correction.  Magnesium was 2.2 and phosphorus 1.9 mg/dL.  Imaging: Portable chest radiograph show worsening patchy consolidation in the left lower lung field and the right infrahilar area most likely due to pneumonia or aspiration there was cardiomegaly and mild central vascular prominence without overt edema.  There was aortic atherosclerosis.   Review of Systems: As mentioned in the history of present illness. All other systems reviewed and are negative. Past Medical History:  Diagnosis Date   Abnormal CT scan, chest 2012   CT chest, several lymphadenopathies. Sees pulmonary   Anemia    intermittent   Arthritis    "?back" (09/19/2018)   Atrial fibrillation (HCC)    s/p AV node ablation & BiV PPM implantation 09/08/11 (op dictation pending)   BPH (benign prostatic hyperplasia)    Saw Dr [name] 2004, normal renal u/s   Bronchitis 08/26/2017   CAD (coronary artery disease)    CHF (congestive heart failure) (HCC)    Thought primarily  to be non-systolic although EF down (EF 16-10% 12/2010, down to 35-40% 09/05/11), cath 2008 with no CAD, nuclear study 07/2011 showing Small area of reversibility in the distal ant/lat wall the left ventricle suspicious for ischemia/septal wall HK but felt to be low risk  (per D/C Summary 07/2011)   Chronic  bronchitis (HCC)    "get it ~ q yr"   Chronic diastolic heart failure (HCC)         Chronic respiratory failure with hypoxia and hypercapnia (HCC) 09/14/2010   Followed in Pulmonary clinic/ [facility]/ [facility]  Started on 02 2lpm at discharge 09/23/11   - PFT's 10/28/2011  FEV1  1.40 (51%)  with ratio 70 and no better p B2 and DLCO 53 corrects to 101    - PFT's 10/18/2014    FEV1 1.71 (59%) with ratio 68 and no sign change p B2 and DLCO 53 corrects to 85  -  HC03   07/28/20  = 33  -  HCO3   03/17/21     = 31     Heart murmur    HLD (hyperlipidemia)    HTN (hypertension)    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   Pleural effusion 2008   S/p decortication   Pneumonia 08/02/2011   Pulmonary HTN (HCC)    per cath 2008   Type II diabetes mellitus (HCC) 1999   Past Surgical History:  Procedure Laterality Date   BI-VENTRICULAR PACEMAKER INSERTION Left 09/08/2011   Procedure: BI-VENTRICULAR PACEMAKER INSERTION (CRT-P);  Surgeon: Marinus Maw, MD;  Location: [facility] CATH LAB;  Service: Cardiovascular;  Laterality: Left;   CARDIAC CATHETERIZATION     "couple times; never had balloon or stent" (09/19/2018)   CATARACT EXTRACTION W/ INTRAOCULAR LENS  IMPLANT, BILATERAL Bilateral 08/2018   COLONOSCOPY  03/10/11   normal   INSERT / REPLACE / REMOVE PACEMAKER  09/08/11   pacemaker placement   LUNG DECORTICATION     PLEURAL SCARIFICATION     pneumothorax with fibrothorax  ~ 2010   TONSILLECTOMY     "as a kid"    Social History:  reports that he quit smoking about 41 years ago. His smoking use included cigarettes and cigars. He has a 62.50 pack-year smoking history. He has never used smokeless tobacco. He reports that he does not currently use alcohol. He reports that he does not use drugs.  Allergies  Allergen  Reactions   Hydrocodone Other (See Comments)    "given to him in the hospital; went thru withdrawals once home; dr said not to take it again" (09/27/2012) dizziness   Ultram [Tramadol] Other (See Comments)    "given to him in the hospital; went thru withdrawals once home; dr said not to take it again" (09/27/2012) dizziness   Cialis [Tadalafil] Other (See Comments)    Headache Backache  Dizziness    Avelox [Moxifloxacin Hydrochloride] Itching and Other (See Comments)    Headache Dizziness    Family History  Problem Relation Age of Onset   Diabetes Father    Coronary artery disease Father    Polycythemia Mother    Drug abuse Son    Breast cancer Maternal Aunt    Prostate cancer Neg Hx    Colon cancer Neg  Hx     Prior to Admission medications   Medication Sig Start Date End Date Taking? Authorizing Provider  acetaminophen (TYLENOL) 500 MG tablet Take 1,000 mg by mouth every 8 (eight) hours as needed for headache or moderate pain.   Yes [provider]  albuterol (VENTOLIN HFA) 108 (90 Base) MCG/ACT inhaler Inhale 2 puffs into the lungs every 6 (six) hours as needed for wheezing or shortness of breath. Patient taking differently: Inhale 1 puff into the lungs 2 (two) times daily. 03/11/21  Yes Ghimire, Werner Lean, MD  amLODipine (NORVASC) 10 MG tablet Take 1 tablet (10 mg total) by mouth daily. Patient taking differently: Take 10 mg by mouth in the morning. 06/07/22  Yes [name], MD  aspirin 81 MG tablet Take 81 mg by mouth every morning.    Yes [provider]  atorvastatin (LIPITOR) 20 MG tablet Take 1 tablet (20 mg total) by mouth at bedtime. Patient taking differently: Take 20 mg by mouth every evening. 06/07/22  Yes Paz, Nolon Rod, MD  bisoprolol (ZEBETA) 10 MG tablet Take 1 tablet (10 mg total) by mouth daily. 08/26/22  Yes Paz, Elita Quick E, MD  budesonide (PULMICORT) 0.5 MG/2ML nebulizer solution Take 2 mLs (0.5 mg total) by nebulization 2 (two) times daily. 08/02/22   Yes Paz, Nolon Rod, MD  calcium-vitamin D (OSCAL WITH D) 500-200 MG-UNIT tablet Take 1 tablet by mouth every morning.   Yes [provider]  carvedilol (COREG) 25 MG tablet Take 25 mg by mouth 2 (two) times daily with a meal.   Yes [provider]  cholecalciferol (VITAMIN D3) 25 MCG (1000 UNIT) tablet Take 1,000 Units by mouth every evening.   Yes [provider]  diphenhydramine-acetaminophen (TYLENOL PM) 25-500 MG TABS tablet Take 1 tablet by mouth at bedtime as needed (sleep).   Yes [provider]  FARXIGA 10 MG TABS tablet TAKE 1 TABLET DAILY BEFORE BREAKFAST Patient taking differently: Take 10 mg by mouth in the morning. 03/29/22  Yes Paz, Nolon Rod, MD  furosemide (LASIX) 40 MG tablet TAKE 2 TABLETS EVERY MORNING AND 1 TABLET LATE IN THE AFTERNOON Patient taking differently: Take 40 mg by mouth in the morning. 02/26/22  Yes [name], MD  guaiFENesin (MUCINEX) 600 MG 12 hr tablet Take 600 mg by mouth 2 (two) times daily.   Yes [provider]  ipratropium-albuterol (DUONEB) 0.5-2.5 (3) MG/3ML SOLN Take 3 mLs by nebulization every 6 (six) hours as needed. Patient taking differently: Take 3 mLs by nebulization in the morning and at bedtime. 08/02/22  Yes [name], MD  Multiple Vitamins-Minerals (MULTIVITAMINS THER. W/MINERALS) TABS tablet Take 1 tablet by mouth daily. Patient taking differently: Take 1 tablet by mouth in the morning. 09/14/13  Yes Paz, Nolon Rod, MD  nitroGLYCERIN (NITROSTAT) 0.4 MG SL tablet Place 1 tablet (0.4 mg total) under the tongue every 5 (five) minutes x 3 doses as needed for chest pain. 09/07/21  Yes [name], MD  pantoprazole (PROTONIX) 40 MG tablet Take 1 tablet (40 mg total) by mouth daily before breakfast. 03/05/22  Yes Paz, Nolon Rod, MD  predniSONE (DELTASONE) 10 MG tablet Take 40 mg daily for 1 day, 30 mg daily for 1 day, 20 mg daily for 1 days,10 mg daily for 1 day, then stop 08/25/22  Yes Ghimire, Werner Lean, MD   tamsulosin (FLOMAX) 0.4 MG CAPS capsule Take 1 capsule (0.4 mg total) by mouth daily after supper. 05/27/22  Yes  [name], MD  zolpidem (AMBIEN) 10 MG tablet Take 10 mg by mouth at bedtime as needed for sleep.   Yes [provider]  diphenhydramine-acetaminophen (TYLENOL PM) 25-500 MG TABS tablet Take 1 tablet by mouth at bedtime as needed (sleep).    [provider]  Lancets Letta Pate ULTRASOFT) lancets Check blood sugars no more than twice daily 03/08/17   [name], MD  [facility] VERIO test strip USE TO CHECK BLOOD SUGAR NO MORE THAN TWICE A DAY 02/09/21   [name], MD    Physical Exam: Vitals:   08/27/22 0500 08/27/22 0552 08/27/22 0806  BP: (!) 136/106 134/72   Pulse: 72 69 69  Resp: (!) 31 (!) 34 (!) 30  Temp: 99.1 F (37.3 C)    TempSrc: Oral    SpO2: 97% 94% 94%   Physical Exam Vitals and nursing note reviewed.  Constitutional:      General: He is awake. He is not in acute distress.    Appearance: He is well-developed.     Interventions: Nasal cannula in place.  HENT:     Head: Normocephalic.     Nose: No rhinorrhea.     Mouth/Throat:     Mouth: Mucous membranes are moist.  Eyes:     General: No scleral icterus.    Pupils: Pupils are equal, round, and reactive to light.  Neck:     Vascular: No JVD.  Cardiovascular:     Rate and Rhythm: Normal rate and regular rhythm.     Heart sounds: S1 normal and S2 normal.  Pulmonary:     Effort: Pulmonary effort is normal.     Breath sounds: Examination of the left-lower field reveals rales. Decreased breath sounds, wheezing, rhonchi and rales present.  Abdominal:     General: Bowel sounds are normal. There is no distension.     Palpations: Abdomen is soft.     Tenderness: There is no abdominal tenderness. There is no guarding.  Musculoskeletal:     Cervical back: Neck supple.     Right lower leg: No edema.     Left lower leg: No edema.  Skin:    General: Skin is warm and dry.  Neurological:      General: No focal deficit present.     Mental Status: He is alert and oriented to person, place, and time.  Psychiatric:        Mood and Affect: Mood normal.        Behavior: Behavior normal. Behavior is cooperative.   Data Reviewed:  Results are pending, will review when available.  07/21/2022 echocardiogram IMPRESSIONS:   1. Left ventricular ejection fraction, by estimation, is 55 to 60%. The  left ventricle has normal function. The left ventricle has no regional  wall motion abnormalities. There is mild concentric left ventricular  hypertrophy. Left ventricular diastolic  parameters are indeterminate.   2. Peak RV-RA gradient 43 mmHg. Right ventricular systolic function is  moderately reduced. The right ventricular size is mildly enlarged.   3. Left atrial size was mildly dilated.   4. Right atrial size was mildly dilated.   5. The mitral valve is normal in structure. Trivial mitral valve  regurgitation. No evidence of mitral stenosis.   6. The aortic valve is tricuspid. There is moderate calcification of the  aortic valve. Aortic valve regurgitation is not visualized. Aortic valve  sclerosis/calcification is present, without any evidence of aortic  stenosis. Aortic valve mean gradient  measures 6.0  mmHg.   7. The IVC was not visualized.   ECG: Vent. rate 70 BPM PR interval * ms QRS duration 130 ms QT/QTcB 402/434 ms P-R-T axes * -69 91 VENTRICULAR PACING Underlying atrial fibrillation Left axis deviation Right bundle branch block Left ventricular hypertrophy with repolarization abnormality ( R in aVL ) Cannot rule out Anteroseptal infarct , age undetermined Abnormal ECG  Assessment and Plan: Principal Problem:   Acute respiratory failure with hypoxemia (HCC) In the setting of:   COPD exacerbation (HCC) Recent COVID-19 disease Now with superimposed left lower lobe pneumonia Admit to PCU/inpatient. Continue supplemental oxygen. Scheduled and as needed  bronchodilators. BiPAP ventilation as needed. Continue ceftriaxone 1 g IVPB daily. Continue azithromycin 500 mg IVPB daily. Check strep pneumoniae urinary antigen. Check sputum Gram stain, culture and sensitivity. Follow-up blood culture and sensitivity. Follow-up CBC and chemistry in the morning.  Active Problems:   Hypokalemia Secondary to albuterol/furosemide use. KCl 40 mEq x 2 doses today then KCl 40 mill equivalents p.o. daily.    Hypophosphatemia Correcting. Follow-up phosphorus level as needed.    Type 2 diabetes mellitus (HCC) Hemoglobin A1c was 6.2% last month. Carbohydrate modified diet. Continue dapagliflozin 10 mg p.o. daily. CBG monitoring before meals and bedtime. CBG monitoring with RI SS while on glucocorticoids.    Essential hypertension Continue amlodipine 10 mg p.o. daily. Continue bisoprolol 10 mg p.o. daily. Also on furosemide 40 mg p.o. daily. Monitor BP, HR, renal function electrolytes.    Atrial fibrillation (HCC) CHA2DS2-VASc Score of at least 6. Off anticoagulation after IC bleed. Continue bisoprolol for rate control.    PAD (peripheral artery disease) (HCC) Continue aspirin and statin.    Chronic diastolic heart failure (HCC) No signs of decompensation. Continue beta-blocker and diuretic. Also on Farxiga 10 mg p.o. daily. May benefit from ARB/ACE inhibitor.    CAD (coronary artery disease) Continue aspirin, beta-blocker and statin. Follow-up with cardiology as an outpatient.    GERD without esophagitis Continue pantoprazole 40 mg p.o. daily.    Benign enlargement of the prostate Continue tamsulosin 0.4 mg po in the evenings. Follow-up with urology as an outpatient.     Advance Care Planning:   Code Status: Full Code   Consults:   Family Communication:   Severity of Illness: The appropriate patient status for this patient is OBSERVATION. Observation status is judged to be reasonable and necessary in order to provide the  required intensity of service to ensure the patient's safety. The patient's presenting symptoms, physical exam findings, and initial radiographic and laboratory data in the context of their medical condition is felt to place them at decreased risk for further clinical deterioration. Furthermore, it is anticipated that the patient will be medically stable for discharge from the hospital within 2 midnights of admission.   Author: Bobette Mo, MD 08/27/2022 8:23 AM  For on call review [email or web address].   This document was prepared using Dragon voice recognition software and may contain some unintended transcription errors.

## 2022-08-27 NOTE — ED Notes (Signed)
Date and time results received: 08/27/22 6:00 AM  (use smartphrase ".now" to insert current time)  Test: PO2 venous Critical Value: <31  Name of Provider Notified: Cardama  Orders Received? Or Actions Taken?:  See mar

## 2022-08-27 NOTE — Telephone Encounter (Signed)
Spoke with patient's wife . She statees he has gone back to the hospital due to breathing issues. Karena Addison, LPN [facility] Nurse Health Advisor Direct Dial [phone]

## 2022-08-27 NOTE — ED Provider Notes (Signed)
[facility]-EMERGENCY DEPT Provider Note  CSN: [id number] Arrival date & time: 08/27/22 0440  Chief Complaint(s) Shortness of Breath  HPI Bradley CRUMBLE Sr. is a 81 y.o. male with past medical history listed below including COPD on 2 to 4 L nasal cannula at home, interstitial lung disease, chronic diastolic heart failure who presents to the emergency department for shortness of breath.  Patient was admitted last week for COPD exacerbation related to COVID-19 infection.  Reports that he was feeling fine after getting discharged and going home on steroids however last night he started having shortness of breath that worsened while trying to sleep.  He tried using home breathing treatments that did not help.  This prompted a call to EMS.  They noted patient was satting 70% on his normal home oxygen.  He was placed on nonrebreather which help his shortness of breath.    The history is provided by the patient and the EMS personnel.    Past Medical History Past Medical History:  Diagnosis Date   Abnormal CT scan, chest 2012   CT chest, several lymphadenopathies. Sees pulmonary   Anemia    intermittent   Arthritis    "?back" (09/19/2018)   Atrial fibrillation (HCC)    s/p AV node ablation & BiV PPM implantation 09/08/11 (op dictation pending)   BPH (benign prostatic hyperplasia)    Saw Dr [name] 2004, normal renal u/s   Bronchitis 08/26/2017   CAD (coronary artery disease)    CHF (congestive heart failure) (HCC)    Thought primarily to be non-systolic although EF down (EF 75-10% 12/2010, down to 35-40% 09/05/11), cath 2008 with no CAD, nuclear study 07/2011 showing Small area of reversibility in the distal ant/lat wall the left ventricle suspicious for ischemia/septal wall HK but felt to be low risk  (per D/C Summary 07/2011)   Chronic bronchitis (HCC)    "get it ~ q yr"   Chronic diastolic heart failure (HCC)         Chronic respiratory failure with hypoxia and  hypercapnia (HCC) 09/14/2010   Followed in Pulmonary clinic/ [facility]/ [facility]  Started on 02 2lpm at discharge 09/23/11   - PFT's 10/28/2011  FEV1  1.40 (51%)  with ratio 70 and no better p B2 and DLCO 53 corrects to 101    - PFT's 10/18/2014    FEV1 1.71 (59%) with ratio 68 and no sign change p B2 and DLCO 53 corrects to 85  -  HC03   07/28/20  = 33  -  HCO3   03/17/21     = 31     Heart murmur    HLD (hyperlipidemia)    HTN (hypertension)    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   Pleural effusion 2008   S/p decortication   Pneumonia 08/02/2011   Pulmonary HTN (HCC)    per cath 2008   Type II diabetes mellitus (HCC) 1999   Patient Active Problem List   Diagnosis Date Noted   COVID 08/23/2022   Displaced fracture of shaft of third metacarpal bone, left hand, initial encounter for closed fracture 08/03/2022   ILD (interstitial lung disease) (HCC) 07/30/2022   CAD (coronary artery disease) 07/19/2022   Acute on chronic  diastolic heart failure (HCC) 07/18/2022   Acute on chronic respiratory failure with hypoxia (HCC) 07/18/2022   GERD without esophagitis 07/30/2021   Cor pulmonale (chronic) (HCC) 08/05/2020   COPD exacerbation (HCC) 12/22/2018   COPD GOLD 0 spirometry/ AB  12/22/2018   PCP NOTES >>> 07/15/2015   Annual physical exam 05/08/2012   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   Sinoatrial node dysfunction (HCC)    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   Chronic diastolic heart failure (HCC)    PAD (peripheral artery disease) (HCC) 10/22/2010   DERMATITIS, STASIS 09/14/2010   Chronic respiratory failure with hypoxia and hypercapnia (HCC) 09/14/2010   Chronic pulmonary hypertension secondary to elevated L H pressures  04/15/2009   ERECTILE DYSFUNCTION 01/06/2009   Mixed diabetic  hyperlipidemia associated with type 2 diabetes mellitus (HCC) 12/13/2007   Essential hypertension 12/13/2007   Anxiety--insomnia 08/14/2007   DM (diabetes mellitus) (HCC) 01/02/2007   Atrial fibrillation (HCC) 01/02/2007   Benign enlargement of prostate 01/02/2007   Home Medication(s) Prior to Admission medications   Medication Sig Start Date End Date Taking? Authorizing Provider  acetaminophen (TYLENOL) 500 MG tablet Take 1,000 mg by mouth as needed for headache or moderate pain.    [provider]  albuterol (VENTOLIN HFA) 108 (90 Base) MCG/ACT inhaler Inhale 2 puffs into the lungs every 6 (six) hours as needed for wheezing or shortness of breath. Patient taking differently: Inhale 1 puff into the lungs 2 (two) times daily. 03/11/21   Ghimire, Werner Lean, MD  amLODipine (NORVASC) 10 MG tablet Take 1 tablet (10 mg total) by mouth daily. Patient taking differently: Take 10 mg by mouth in the morning. 06/07/22   [name], MD  aspirin 81 MG tablet Take 81 mg by mouth every morning.     [provider]  atorvastatin (LIPITOR) 20 MG tablet Take 1 tablet (20 mg total) by mouth at bedtime. Patient taking differently: Take 20 mg by mouth every evening. 06/07/22   [name], MD  bisoprolol (ZEBETA) 10 MG tablet Take 1 tablet (10 mg total) by mouth daily. 08/26/22   [name], MD  budesonide (PULMICORT) 0.5 MG/2ML nebulizer solution Take 2 mLs (0.5 mg total) by nebulization 2 (two) times daily. 08/02/22   [name], MD  calcium-vitamin D (OSCAL WITH D) 500-200 MG-UNIT tablet Take 1 tablet by mouth every morning.    [provider]  cholecalciferol (VITAMIN D3) 25 MCG (1000 UNIT) tablet Take 1,000 Units by mouth every evening.    [provider]  diphenhydramine-acetaminophen (TYLENOL PM) 25-500 MG TABS tablet Take 1 tablet by mouth at bedtime as needed (sleep).    [provider]  FARXIGA 10 MG TABS tablet TAKE 1 TABLET DAILY BEFORE BREAKFAST Patient  taking differently: Take 10 mg by mouth in the morning. 03/29/22   [name], MD  furosemide (LASIX) 40 MG tablet TAKE 2 TABLETS EVERY MORNING AND 1 TABLET LATE IN THE AFTERNOON Patient taking differently: Take 40 mg by mouth in the morning. 02/26/22   [name], MD  ipratropium-albuterol (DUONEB) 0.5-2.5 (3) MG/3ML SOLN Take 3 mLs by nebulization every 6 (six) hours as needed. Patient taking differently: Take 3 mLs by nebulization in the morning and at bedtime. 08/02/22   [name], MD  Lancets [facility] ULTRASOFT) lancets Check blood sugars no more than twice daily 03/08/17   [name], MD  Multiple Vitamins-Minerals (MULTIVITAMINS THER. W/MINERALS) TABS tablet Take 1 tablet by mouth daily. Patient taking differently: Take  1 tablet by mouth in the morning. 09/14/13   [name], MD  nitroGLYCERIN (NITROSTAT) 0.4 MG SL tablet Place 1 tablet (0.4 mg total) under the tongue every 5 (five) minutes x 3 doses as needed for chest pain. 09/07/21   [name], MD  [facility] VERIO test strip USE TO CHECK BLOOD SUGAR NO MORE THAN TWICE A DAY 02/09/21   [name], MD  pantoprazole (PROTONIX) 40 MG tablet Take 1 tablet (40 mg total) by mouth daily before breakfast. 03/05/22   [name], MD  predniSONE (DELTASONE) 10 MG tablet Take 40 mg daily for 1 day, 30 mg daily for 1 day, 20 mg daily for 1 days,10 mg daily for 1 day, then stop 08/25/22   Maretta Bees, MD  tamsulosin (FLOMAX) 0.4 MG CAPS capsule Take 1 capsule (0.4 mg total) by mouth daily after supper. 05/27/22   [name], MD  zolpidem (AMBIEN) 10 MG tablet Take 10 mg by mouth at bedtime as needed for sleep.    [provider]                                                                                                                                    Allergies Hydrocodone, Ultram [tramadol], Cialis [tadalafil], and Avelox [moxifloxacin hydrochloride]  Review of Systems Review of Systems As noted in HPI  Physical  Exam Vital Signs  I have reviewed the triage vital signs BP 134/72   Pulse 69   Temp 99.1 F (37.3 C) (Oral)   Resp (!) 34   SpO2 94%   Physical Exam Vitals reviewed.  Constitutional:      General: He is not in acute distress.    Appearance: He is well-developed. He is not diaphoretic.  HENT:     Head: Normocephalic and atraumatic.     Nose: Nose normal.  Eyes:     General: No scleral icterus.       Right eye: No discharge.        Left eye: No discharge.     Conjunctiva/sclera: Conjunctivae normal.     Pupils: Pupils are equal, round, and reactive to light.  Cardiovascular:     Rate and Rhythm: Normal rate and regular rhythm.     Heart sounds: No murmur heard.    No friction rub. No gallop.  Pulmonary:     Effort: Tachypnea and respiratory distress present.     Breath sounds: Decreased air movement present. No stridor. Wheezing present. No rales.  Abdominal:     General: There is no distension.     Palpations: Abdomen is soft.     Tenderness: There is no abdominal tenderness.  Musculoskeletal:        General: No tenderness.     Cervical back: Normal range of motion and neck supple.  Skin:    General: Skin is warm and dry.     Findings:  No erythema or rash.  Neurological:     Mental Status: He is alert and oriented to person, place, and time.     ED Results and Treatments Labs (all labs ordered are listed, but only abnormal results are displayed) Labs Reviewed  CBC WITH DIFFERENTIAL/PLATELET - Abnormal; Notable for the following components:      Result Value   Abs Immature Granulocytes 0.08 (*)    All other components within normal limits  BRAIN NATRIURETIC PEPTIDE - Abnormal; Notable for the following components:   B Natriuretic Peptide 283.3 (*)    All other components within normal limits  COMPREHENSIVE METABOLIC PANEL - Abnormal; Notable for the following components:   Potassium 3.2 (*)    Glucose, Bld 100 (*)    Calcium 8.4 (*)    Albumin 3.4 (*)     All other components within normal limits  BLOOD GAS, VENOUS - Abnormal; Notable for the following components:   pH, Ven 7.46 (*)    pO2, Ven <31 (*)    Bicarbonate 38.4 (*)    Acid-Base Excess 12.3 (*)    All other components within normal limits                                                                                                                         EKG  EKG Interpretation  Date/Time:    Ventricular Rate:    PR Interval:    QRS Duration:   QT Interval:    QTC Calculation:   R Axis:     Text Interpretation:         Radiology DG Chest Port 1 View  Result Date: 08/27/2022 CLINICAL DATA:  Hypoxia, shortness of breath and chills. EXAM: PORTABLE CHEST 1 VIEW COMPARISON:  PA and lateral 08/23/2022 FINDINGS: 5:17 a.m. Left chest dual lead pacing system and wire insertions are unaltered. The aorta is tortuous and ectatic with calcification of the transverse segment and stable mediastinum. Cardiomegaly appears similar. There is mild central vascular prominence without overt edema. Today there is worsening patchy consolidation in the left lower lung field and the right infrahilar area, most likely due to pneumonia or aspiration. The remainder of the lungs remain clear. No pleural effusion is seen. In all other respects no further changes. 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 IMPRESSION: 1. Worsening patchy consolidation in the left lower lung field and the right infrahilar area, most likely due to pneumonia or aspiration. Follow-up study recommended to ensure clearing. 2. Cardiomegaly and mild central vascular prominence without overt edema. 3. Aortic atherosclerosis. Electronically Signed   By: Almira Bar M.D.   On: 08/27/2022 05:31    Medications Ordered in ED Medications  magnesium sulfate IVPB 2 g 50 mL (has no administration in time range)  albuterol (PROVENTIL) (2.5 MG/3ML) 0.083% nebulizer solution (has no administration in  time range)  ipratropium (ATROVENT) nebulizer solution 0.5 mg (has no administration in time range)  azithromycin (ZITHROMAX) 500 mg  in sodium chloride 0.9 % 250 mL IVPB (has no administration in time range)  ipratropium-albuterol (DUONEB) 0.5-2.5 (3) MG/3ML nebulizer solution 3 mL (3 mLs Nebulization Given 08/27/22 0532)  methylPREDNISolone sodium succinate (SOLU-MEDROL) 125 mg/2 mL injection 125 mg (125 mg Intravenous Given 08/27/22 0513)                                                                                                                                     Procedures .Critical Care  Performed by: Nira Conn, MD Authorized by: Nira Conn, MD   Critical care provider statement:    Critical care time (minutes):  45   Critical care time was exclusive of:  Separately billable procedures and treating other patients   Critical care was necessary to treat or prevent imminent or life-threatening deterioration of the following conditions:  Respiratory failure   Critical care was time spent personally by me on the following activities:  Development of treatment plan with patient or surrogate, discussions with consultants, evaluation of patient's response to treatment, examination of patient, obtaining history from patient or surrogate, review of old charts, re-evaluation of patient's condition, pulse oximetry, ordering and review of radiographic studies, ordering and review of laboratory studies and ordering and performing treatments and interventions   Care discussed with: admitting provider     (including critical care time)  Medical Decision Making / ED Course   Medical Decision Making Amount and/or Complexity of Data Reviewed Labs: ordered. Decision-making details documented in ED Course. Radiology: ordered and independent interpretation performed. Decision-making details documented in ED Course.  Risk Prescription drug management. Decision regarding  hospitalization.    Respiratory distress Hypoxic on his normal home oxygen. Presentation is most suspicious for COPD exacerbation.  Less likely heart failure but will assess for this as well.  We will rule out pneumonia, pneumothorax, pleural effusions.  CBC without leukocytosis or anemia Metabolic panel without significant electrolyte derangements or renal sufficiency BNP 286.  Lower than prior. VBG not consistent with respiratory acidosis.  Chest x-ray with worsening bibasilar opacities which may be pneumonia versus atelectasis from decrease inspiration.  No pulmonary edema or pleural effusions.  No pneumothorax.  Patient given Solu-Medrol and DuoNeb. On reassessment patient still tachypneic and short of breath.  Patient will require additional breathing treatments.  Magnesium ordered.  Azithromycin ordered for possible atypical pneumonia. Will admit.      Final Clinical Impression(s) / ED Diagnoses Final diagnoses:  COPD exacerbation (HCC)           This chart was dictated using voice recognition software.  Despite best efforts to proofread,  errors can occur which can change the documentation meaning.    Nira Conn, MD 08/27/22 [phone]

## 2022-08-27 NOTE — Progress Notes (Signed)
Pt refused BiPAP.

## 2022-08-28 DIAGNOSIS — I10 Essential (primary) hypertension: Secondary | ICD-10-CM | POA: Diagnosis not present

## 2022-08-28 DIAGNOSIS — E876 Hypokalemia: Secondary | ICD-10-CM

## 2022-08-28 DIAGNOSIS — J9601 Acute respiratory failure with hypoxia: Secondary | ICD-10-CM | POA: Diagnosis not present

## 2022-08-28 DIAGNOSIS — J9612 Chronic respiratory failure with hypercapnia: Secondary | ICD-10-CM

## 2022-08-28 DIAGNOSIS — J9611 Chronic respiratory failure with hypoxia: Secondary | ICD-10-CM | POA: Diagnosis not present

## 2022-08-28 DIAGNOSIS — J189 Pneumonia, unspecified organism: Secondary | ICD-10-CM

## 2022-08-28 DIAGNOSIS — I5032 Chronic diastolic (congestive) heart failure: Secondary | ICD-10-CM

## 2022-08-28 LAB — PROCALCITONIN: Procalcitonin: <0.1 ng/mL

## 2022-08-28 LAB — COMPREHENSIVE METABOLIC PANEL
ALT: 18 U/L (ref 0–44)
AST: 21 U/L (ref 15–41)
Albumin: 3.1 g/dL — ABNORMAL LOW (ref 3.5–5.0)
Alkaline Phosphatase: 56 U/L (ref 38–126)
Anion gap: 11 (ref 5–15)
BUN: 18 mg/dL (ref 8–23)
CO2: 27 mmol/L (ref 22–32)
Calcium: 8.2 mg/dL — ABNORMAL LOW (ref 8.9–10.3)
Chloride: 100 mmol/L (ref 98–111)
Creatinine, Ser: 0.67 mg/dL (ref 0.61–1.24)
GFR, Estimated: >60 mL/min (ref >60)
Glucose, Bld: 128 mg/dL — ABNORMAL HIGH (ref 70–99)
Potassium: 4.1 mmol/L (ref 3.5–5.1)
Sodium: 138 mmol/L (ref 135–145)
Total Bilirubin: 0.6 mg/dL (ref 0.3–1.2)
Total Protein: 6.3 g/dL — ABNORMAL LOW (ref 6.5–8.1)

## 2022-08-28 LAB — CBC WITH DIFFERENTIAL/PLATELET
Abs Immature Granulocytes: 0.06 10*3/uL (ref 0.00–0.07)
Basophils Absolute: 0 10*3/uL (ref 0.0–0.1)
Basophils Relative: 0 %
Eosinophils Absolute: 0 10*3/uL (ref 0.0–0.5)
Eosinophils Relative: 0 %
HCT: 42 % (ref 39.0–52.0)
Hemoglobin: 13.6 g/dL (ref 13.0–17.0)
Immature Granulocytes: 1 %
Lymphocytes Relative: 11 %
Lymphs Abs: 1.1 10*3/uL (ref 0.7–4.0)
MCH: 30.7 pg (ref 26.0–34.0)
MCHC: 32.4 g/dL (ref 30.0–36.0)
MCV: 94.8 fL (ref 80.0–100.0)
Monocytes Absolute: 0.7 10*3/uL (ref 0.1–1.0)
Monocytes Relative: 7 %
Neutro Abs: 8.6 10*3/uL — ABNORMAL HIGH (ref 1.7–7.7)
Neutrophils Relative %: 81 %
Platelets: 184 10*3/uL (ref 150–400)
RBC: 4.43 MIL/uL (ref 4.22–5.81)
RDW: 14.5 % (ref 11.5–15.5)
WBC: 10.5 10*3/uL (ref 4.0–10.5)
nRBC: 0 % (ref 0.0–0.2)

## 2022-08-28 LAB — C-REACTIVE PROTEIN: CRP: 5.4 mg/dL — ABNORMAL HIGH (ref <1.0)

## 2022-08-28 LAB — GLUCOSE, CAPILLARY
Glucose-Capillary: 128 mg/dL — ABNORMAL HIGH (ref 70–99)
Glucose-Capillary: 164 mg/dL — ABNORMAL HIGH (ref 70–99)
Glucose-Capillary: 235 mg/dL — ABNORMAL HIGH (ref 70–99)
Glucose-Capillary: 175 mg/dL — ABNORMAL HIGH (ref 70–99)

## 2022-08-28 LAB — D-DIMER, QUANTITATIVE: D-Dimer, Quant: 0.46 ug/mL-FEU (ref 0.00–0.50)

## 2022-08-28 NOTE — Hospital Course (Signed)
Bradley Searles Sr. is a 81 y.o. male with a history of anemia, osteoarthritis, chronic atrial fibrillation status post AV nodal ablation and biventricular pacemaker, BPH, bronchitis, CAD, chronic diastolic heart failure, pulmonary hypertension, COPD, chronic respiratory failure, hyperlipidemia, hypertension, diabetes mellitus type 2, intracranial bleed, migraine headache, insomnia, pleural effusions.  Patient presented secondary to shortness of breath and was found to have evidence of community-acquired pneumonia in setting of recent COVID-19 infection.  Patient started empirically on ceftriaxone and azithromycin.  Patient with evidence of associated COPD exacerbation was started on steroids as well.

## 2022-08-28 NOTE — Progress Notes (Addendum)
PROGRESS NOTE    Bradley [name]  [id number] DOB: [birth year] DOA: 08/27/2022 PCP: Colon Branch, MD   Brief Narrative: Bradley Jock Sr. is a 81 y.o. male with a history of anemia, osteoarthritis, chronic atrial fibrillation status post AV nodal ablation and biventricular pacemaker, BPH, bronchitis, CAD, chronic diastolic heart failure, pulmonary hypertension, COPD, chronic respiratory failure, hyperlipidemia, hypertension, diabetes mellitus type 2, intracranial bleed, migraine headache, insomnia, pleural effusions.  Patient presented secondary to shortness of breath and was found to have evidence of community-acquired pneumonia in setting of recent COVID-19 infection.  Patient started empirically on ceftriaxone and azithromycin.  Patient with evidence of associated COPD exacerbation was started on steroids as well.   Assessment and Plan:  Community acquired pneumonia Left lower lobe pneumonia Recent COVID infection.  Patient started empirically on ceftriaxone and azithromycin.  No increased oxygen requirement.  Sputum culture ordered and is pending.  No blood cultures obtained on admission. -Continue ceftriaxone and azithromycin -Follow-up respiratory culture  History of COVID-19 infection Patient recently diagnosed with COVID-19 infection on 11/13.  At that time he was managed with steroids with taper.  Current presentation does not appear to be related to persistent COVID-19 infection.  Last day of isolation is 09/02/2022.  Prednisone started here for COPD exacerbation.  COPD exacerbation Secondary to associated pneumonia.  Patient started on prednisone burst. -Treat pneumonia -Continue prednisone -Continue DuoNeb 4 times daily and albuterol as needed  Hypokalemia Potassium of 3.2 on admission. Resolved with potassium supplementation.  Hypophosphatemia Phosphorus of 1.8 on admission -Recheck phosphorus  Primary hypertension -Continue bisoprolol and amlodipine  Diabetes  mellitus type 2 Controlled.  Most recent Hemoglobin A1c of 6.2%.  Patient is managed on Farxiga as an outpatient; [name] resumed on admission. -Continue sliding scale insulin and Farxiga  Atrial fibrillation, unspecified Patient previously on anticoagulation which was discontinued secondary to history of intracranial hemorrhage.  -Continue bisoprolol   DVT prophylaxis: SCDs; discontinue Lovenox Code Status:   Code Status: Full Code Family Communication: None at bedside Disposition Plan: Discharge likely in AM   Consultants:  None  Procedures:  None  Antimicrobials: Ceftriaxone IV Ceftriaxone IV    Subjective: Patient reports some abdominal pain. Breathing is stable.  Objective: BP 133/87 (BP Location: Right Arm)   Pulse 72   Temp (!) 97.3 F (36.3 C) (Oral)   Resp 18   Ht 5\' 9"  (1.753 m)   Wt 78.3 kg   SpO2 93%   BMI 25.49 kg/m   Examination:  General exam: Appears calm and comfortable Respiratory system: Diminished but no wheezing. Cardiovascular system: S1 & S2 heard, RRR. Gastrointestinal system: Abdomen is distended, soft and tender in left quadrant. Normal bowel sounds heard. Central nervous system: Alert and oriented. No focal neurological deficits. Musculoskeletal: No edema. No calf tenderness Skin: No cyanosis. No rashes Psychiatry: Judgement and insight appear normal. Mood & affect appropriate.    Data Reviewed: I have personally reviewed following labs and imaging studies  CBC Lab Results  Component Value Date   WBC 10.5 08/28/2022   RBC 4.43 08/28/2022   HGB 13.6 08/28/2022   HCT 42.0 08/28/2022   MCV 94.8 08/28/2022   MCH 30.7 08/28/2022   PLT 184 08/28/2022   MCHC 32.4 08/28/2022   RDW 14.5 08/28/2022   LYMPHSABS 1.1 08/28/2022   MONOABS 0.7 08/28/2022   EOSABS 0.0 08/28/2022   BASOSABS 0.0 0000000     Last metabolic panel Lab Results  Component Value Date   NA  138 08/28/2022   K 4.1 08/28/2022   CL 100 08/28/2022   CO2  27 08/28/2022   BUN 18 08/28/2022   CREATININE 0.67 08/28/2022   GLUCOSE 128 (H) 08/28/2022   GFRNONAA >60 08/28/2022   GFRAA >60 09/18/2019   CALCIUM 8.2 (L) 08/28/2022   PHOS 1.8 (L) 08/27/2022   PROT 6.3 (L) 08/28/2022   ALBUMIN 3.1 (L) 08/28/2022   BILITOT 0.6 08/28/2022   ALKPHOS 56 08/28/2022   AST 21 08/28/2022   ALT 18 08/28/2022   ANIONGAP 11 08/28/2022    GFR: Estimated Creatinine Clearance: 72.4 mL/min (by C-G formula based on SCr of 0.67 mg/dL).  Recent Results (from the past 240 hour(s))  Resp Panel by RT-PCR (Flu A&B, Covid) Anterior Nasal Swab     Status: Abnormal   Collection Time: 08/23/22  5:17 AM   Specimen: Anterior Nasal Swab  Result Value Ref Range Status   SARS Coronavirus 2 by RT PCR POSITIVE (A) NEGATIVE Final    Comment: (NOTE) SARS-CoV-2 target nucleic acids are DETECTED.  The SARS-CoV-2 RNA is generally detectable in upper respiratory specimens during the acute phase of infection. Positive results are indicative of the presence of the identified virus, but do not rule out bacterial infection or co-infection with other pathogens not detected by the test. Clinical correlation with patient history and other diagnostic information is necessary to determine patient infection status. The expected result is Negative.  Fact Sheet for Patients: BloggerCourse.com  Fact Sheet for Healthcare Providers: SeriousBroker.it  This test is not yet approved or cleared by the [place] FDA and  has been authorized for detection and/or diagnosis of SARS-CoV-2 by FDA under an Emergency Use Authorization (EUA).  This EUA will remain in effect (meaning this test can be used) for the duration of  the COVID-19 declaration under Section 564(b)(1) of the A ct, 21 U.S.C. section 360bbb-3(b)(1), unless the authorization is terminated or revoked sooner.     Influenza A by PCR NEGATIVE NEGATIVE Final   Influenza B by  PCR NEGATIVE NEGATIVE Final    Comment: (NOTE) The Xpert Xpress SARS-CoV-2/FLU/RSV plus assay is intended as an aid in the diagnosis of influenza from Nasopharyngeal swab specimens and should not be used as a sole basis for treatment. Nasal washings and aspirates are unacceptable for Xpert Xpress SARS-CoV-2/FLU/RSV testing.  Fact Sheet for Patients: BloggerCourse.com  Fact Sheet for Healthcare Providers: SeriousBroker.it  This test is not yet approved or cleared by the [place] FDA and has been authorized for detection and/or diagnosis of SARS-CoV-2 by FDA under an Emergency Use Authorization (EUA). This EUA will remain in effect (meaning this test can be used) for the duration of the COVID-19 declaration under Section 564(b)(1) of the Act, 21 U.S.C. section 360bbb-3(b)(1), unless the authorization is terminated or revoked.  Performed at [facility], [address] [place], [place] [address]   Expectorated Sputum Assessment w Gram Stain, Rflx to Resp Cult     Status: None   Collection Time: 08/27/22  1:58 PM   Specimen: Expectorated Sputum  Result Value Ref Range Status   Specimen Description EXPECTORATED SPUTUM  Final   Special Requests NONE  Final   Sputum evaluation   Final    THIS SPECIMEN IS ACCEPTABLE FOR SPUTUM CULTURE Performed at [facility], [address], [place], [place] [address]    Report Status 08/27/2022 FINAL  Final  Culture, Respiratory w Gram Stain     Status: None (Preliminary result)  Collection Time: 08/27/22  1:58 PM  Result Value Ref Range Status   Specimen Description   Final    EXPECTORATED SPUTUM Performed at [facility] [address] [place], [place] [address]    Special Requests   Final    NONE Reflexed from [id number] Performed at [facility], [place] [address] [place], [place] [address]    Gram Stain   Final    NO WBC  SEEN RARE GRAM POSITIVE COCCI IN PAIRS RARE GRAM POSITIVE RODS RARE GRAM NEGATIVE DIPLOCOCCI    Culture   Final    CULTURE REINCUBATED FOR BETTER GROWTH Performed at [facility], [place] [address], [place], [place] [address]    Report Status PENDING  Incomplete      Radiology Studies: DG Chest Port 1 View  Result Date: 08/27/2022 CLINICAL DATA:  Hypoxia, shortness of breath and chills. EXAM: PORTABLE CHEST 1 VIEW COMPARISON:  PA and lateral 08/23/2022 FINDINGS: 5:17 a.m. Left chest dual lead pacing system and wire insertions are unaltered. The aorta is tortuous and ectatic with calcification of the transverse segment and stable mediastinum. Cardiomegaly appears similar. There is mild central vascular prominence without overt edema. Today there is worsening patchy consolidation in the left lower lung field and the right infrahilar area, most likely due to pneumonia or aspiration. The remainder of the lungs remain clear. No pleural effusion is seen. In all other respects no further changes. 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 IMPRESSION: 1. Worsening patchy consolidation in the left lower lung field and the right infrahilar area, most likely due to pneumonia or aspiration. Follow-up study recommended to ensure clearing. 2. Cardiomegaly and mild central vascular prominence without overt edema. 3. Aortic atherosclerosis. Electronically Signed   By: Telford Nab M.D.   On: 08/27/2022 05:31      LOS: 1 day    Cordelia Poche, MD [facility] 08/28/2022, 6:17 PM   If 7PM-7AM, please contact night-coverage [email or web address]

## 2022-08-28 NOTE — Progress Notes (Signed)
No need of bipap at this time. Pt is resting no signs of resp distress.

## 2022-08-29 DIAGNOSIS — I10 Essential (primary) hypertension: Secondary | ICD-10-CM | POA: Diagnosis not present

## 2022-08-29 DIAGNOSIS — J9611 Chronic respiratory failure with hypoxia: Secondary | ICD-10-CM | POA: Diagnosis not present

## 2022-08-29 DIAGNOSIS — I5032 Chronic diastolic (congestive) heart failure: Secondary | ICD-10-CM | POA: Diagnosis not present

## 2022-08-29 DIAGNOSIS — J9601 Acute respiratory failure with hypoxia: Secondary | ICD-10-CM | POA: Diagnosis not present

## 2022-08-29 LAB — CULTURE, RESPIRATORY W GRAM STAIN
Culture: NORMAL
Gram Stain: NONE SEEN

## 2022-08-29 LAB — PHOSPHORUS: Phosphorus: 3.3 mg/dL (ref 2.5–4.6)

## 2022-08-29 LAB — GLUCOSE, CAPILLARY
Glucose-Capillary: 125 mg/dL — ABNORMAL HIGH (ref 70–99)
Glucose-Capillary: 65 mg/dL — ABNORMAL LOW (ref 70–99)
Glucose-Capillary: 162 mg/dL — ABNORMAL HIGH (ref 70–99)
Glucose-Capillary: 192 mg/dL — ABNORMAL HIGH (ref 70–99)
Glucose-Capillary: 172 mg/dL — ABNORMAL HIGH (ref 70–99)

## 2022-08-29 MED ORDER — IPRATROPIUM-ALBUTEROL 0.5-2.5 (3) MG/3ML IN SOLN
3.0000 mL | Freq: Three times a day (TID) | RESPIRATORY_TRACT | Status: DC
  Administered 2022-08-29 – 2022-08-30 (×2): 3 mL via RESPIRATORY_TRACT
  Filled 2022-08-29 (×4): qty 3

## 2022-08-29 NOTE — Progress Notes (Signed)
PROGRESS NOTE    Dandrae Kustra  YIR:[id number] DOB: 1941-09-28 DOA: 08/27/2022 PCP: [name], MD   Brief Narrative: [name] is a 81 y.o. male with a history of anemia, osteoarthritis, chronic atrial fibrillation status post AV nodal ablation and biventricular pacemaker, BPH, bronchitis, CAD, chronic diastolic heart failure, pulmonary hypertension, COPD, chronic respiratory failure, hyperlipidemia, hypertension, diabetes mellitus type 2, intracranial bleed, migraine headache, insomnia, pleural effusions.  Patient presented secondary to shortness of breath and was found to have evidence of community-acquired pneumonia in setting of recent COVID-19 infection.  Patient started empirically on ceftriaxone and azithromycin.  Patient with evidence of associated COPD exacerbation was started on steroids as well.   Assessment and Plan:  Community acquired pneumonia Left lower lobe pneumonia Recent COVID infection.  Patient started empirically on ceftriaxone and azithromycin.  No increased oxygen requirement.  Sputum culture ordered and is pending.  No blood cultures obtained on admission. -Continue ceftriaxone and azithromycin -Follow-up respiratory culture  History of COVID-19 infection Patient recently diagnosed with COVID-19 infection on 11/13.  At that time he was managed with steroids with taper.  Current presentation does not appear to be related to persistent COVID-19 infection.  Last day of isolation is 09/02/2022.  Prednisone started here for COPD exacerbation.  COPD exacerbation Secondary to associated pneumonia.  Patient started on prednisone burst. -Treat pneumonia -Continue prednisone -Continue DuoNeb 4 times daily and albuterol as needed  Hypokalemia Potassium of 3.2 on admission. Resolved with potassium supplementation.  Hypophosphatemia Phosphorus of 1.8 on admission -Recheck phosphorus  Primary hypertension -Continue bisoprolol and amlodipine  Diabetes  mellitus type 2 Controlled.  Most recent Hemoglobin A1c of 6.2%.  Patient is managed on Farxiga as an outpatient; [name] resumed on admission. -Continue sliding scale insulin and Farxiga  Atrial fibrillation, unspecified Patient previously on anticoagulation which was discontinued secondary to history of intracranial hemorrhage.  -Continue bisoprolol   DVT prophylaxis: SCDs; discontinue Lovenox Code Status:   Code Status: Full Code Family Communication: None at bedside Disposition Plan: Discharge likely in 24 hours if functionally close to baseline   Consultants:  None  Procedures:  None  Antimicrobials: Ceftriaxone IV Azithromycin IV    Subjective: Continued improvement but patient states he is not yet near baseline  Objective: BP (!) 118/56 (BP Location: Right Arm)   Pulse 75   Temp 97.7 F (36.5 C) (Oral)   Resp (!) 22   Ht 5\' 9"  (1.753 m)   Wt 78.3 kg   SpO2 97%   BMI 25.49 kg/m   Examination:  General exam: Appears calm and comfortable Respiratory system: Clear/diminished to auscultation. Cardiovascular system: S1 & S2 heard, RRR.  Gastrointestinal system: Abdomen is distended, soft and nontender. No organomegaly or masses felt. Normal bowel sounds heard. Central nervous system: Alert and oriented. Musculoskeletal: No edema. No calf tenderness Skin: No cyanosis. No rashes Psychiatry: Judgement and insight appear normal. Mood & affect appropriate.    Data Reviewed: I have personally reviewed following labs and imaging studies  CBC Lab Results  Component Value Date   WBC 10.5 08/28/2022   RBC 4.43 08/28/2022   HGB 13.6 08/28/2022   HCT 42.0 08/28/2022   MCV 94.8 08/28/2022   MCH 30.7 08/28/2022   PLT 184 08/28/2022   MCHC 32.4 08/28/2022   RDW 14.5 08/28/2022   LYMPHSABS 1.1 08/28/2022   MONOABS 0.7 08/28/2022   EOSABS 0.0 08/28/2022   BASOSABS 0.0 08/28/2022     Last metabolic panel Lab  Results  Component Value Date   NA 138 08/28/2022    K 4.1 08/28/2022   CL 100 08/28/2022   CO2 27 08/28/2022   BUN 18 08/28/2022   CREATININE 0.67 08/28/2022   GLUCOSE 128 (H) 08/28/2022   GFRNONAA >60 08/28/2022   GFRAA >60 09/18/2019   CALCIUM 8.2 (L) 08/28/2022   PHOS 3.3 08/29/2022   PROT 6.3 (L) 08/28/2022   ALBUMIN 3.1 (L) 08/28/2022   BILITOT 0.6 08/28/2022   ALKPHOS 56 08/28/2022   AST 21 08/28/2022   ALT 18 08/28/2022   ANIONGAP 11 08/28/2022    GFR: Estimated Creatinine Clearance: 72.4 mL/min (by C-G formula based on SCr of 0.67 mg/dL).  Recent Results (from the past 240 hour(s))  Resp Panel by RT-PCR (Flu A&B, Covid) Anterior Nasal Swab     Status: Abnormal   Collection Time: 08/23/22  5:17 AM   Specimen: Anterior Nasal Swab  Result Value Ref Range Status   SARS Coronavirus 2 by RT PCR POSITIVE (A) NEGATIVE Final    Comment: (NOTE) SARS-CoV-2 target nucleic acids are DETECTED.  The SARS-CoV-2 RNA is generally detectable in upper respiratory specimens during the acute phase of infection. Positive results are indicative of the presence of the identified virus, but do not rule out bacterial infection or co-infection with other pathogens not detected by the test. Clinical correlation with patient history and other diagnostic information is necessary to determine patient infection status. The expected result is Negative.  Fact Sheet for Patients: BloggerCourse.com  Fact Sheet for Healthcare Providers: SeriousBroker.it  This test is not yet approved or cleared by the [place] FDA and  has been authorized for detection and/or diagnosis of SARS-CoV-2 by FDA under an Emergency Use Authorization (EUA).  This EUA will remain in effect (meaning this test can be used) for the duration of  the COVID-19 declaration under Section 564(b)(1) of the A ct, 21 U.S.C. section 360bbb-3(b)(1), unless the authorization is terminated or revoked sooner.     Influenza A by  PCR NEGATIVE NEGATIVE Final   Influenza B by PCR NEGATIVE NEGATIVE Final    Comment: (NOTE) The Xpert Xpress SARS-CoV-2/FLU/RSV plus assay is intended as an aid in the diagnosis of influenza from Nasopharyngeal swab specimens and should not be used as a sole basis for treatment. Nasal washings and aspirates are unacceptable for Xpert Xpress SARS-CoV-2/FLU/RSV testing.  Fact Sheet for Patients: BloggerCourse.com  Fact Sheet for Healthcare Providers: SeriousBroker.it  This test is not yet approved or cleared by the [place] FDA and has been authorized for detection and/or diagnosis of SARS-CoV-2 by FDA under an Emergency Use Authorization (EUA). This EUA will remain in effect (meaning this test can be used) for the duration of the COVID-19 declaration under Section 564(b)(1) of the Act, 21 U.S.C. section 360bbb-3(b)(1), unless the authorization is terminated or revoked.  Performed at [facility], [address], [place], [place] [address]   Expectorated Sputum Assessment w Gram Stain, Rflx to Resp Cult     Status: None   Collection Time: 08/27/22  1:58 PM   Specimen: Expectorated Sputum  Result Value Ref Range Status   Specimen Description EXPECTORATED SPUTUM  Final   Special Requests NONE  Final   Sputum evaluation   Final    THIS SPECIMEN IS ACCEPTABLE FOR SPUTUM CULTURE Performed at [facility], [address], [place], [place] [address]    Report Status 08/27/2022 FINAL  Final  Culture, Respiratory w Gram Stain  Status: None (Preliminary result)   Collection Time: 08/27/22  1:58 PM  Result Value Ref Range Status   Specimen Description   Final    EXPECTORATED SPUTUM Performed at [facility], [address] [place], [place] [address]    Special Requests   Final    NONE Reflexed from [id number] Performed at [facility], [address], [place],  [place] [address]    Gram Stain   Final    NO WBC SEEN RARE GRAM POSITIVE COCCI IN PAIRS RARE GRAM POSITIVE RODS RARE GRAM NEGATIVE DIPLOCOCCI    Culture   Final    CULTURE REINCUBATED FOR BETTER GROWTH Performed at [facility], [address], [place], [place] [address]    Report Status PENDING  Incomplete      Radiology Studies: No results found.    LOS: 2 days    Jacquelin Hawking, MD [facility] 08/29/2022, 2:38 PM   If 7PM-7AM, please contact night-coverage [email or web address]

## 2022-08-29 NOTE — Progress Notes (Signed)
Patient O2 saturation at rest, 93%. Patient O2 saturation while ambulating on 4L of oxygen remained between 90-94%.

## 2022-08-29 NOTE — TOC Initial Note (Signed)
Transition of Care (TOC) - Initial/Assessment Note    Patient Details  Name: Bradley MERGEN Sr. MRN: [id number] Date of Birth: 30-Oct-1940  Transition of Care [facility]) CM/SW Contact:    Bradley Prows, RN Phone Number: 08/29/2022, 3:36 PM  Clinical Narrative:                 Noted pt on home oxygen; spoke with pt's wife Bradley [name] over phone [phone]); she verifies the pt is from home plans for his return at d/c; she says the pt has home oxygen but she is unsure of the providing company; she also says the pt has a large tank at home and his portable tank is fully charged; Bradley [name] says the pt has transportation home; she says the pt wears glasses and he has hearing aids but will not use them; Bradley [name] says the pt does not have dentures, and he does not receive HH services; TOC will con't to follow.  Expected Discharge Plan: Home/Self Care Barriers to Discharge: Continued Medical Work up   Patient Goals and CMS Choice Patient states their goals for this hospitalization and ongoing recovery are:: home      Expected Discharge Plan and Services Expected Discharge Plan: Home/Self Care       Living arrangements for the past 2 months: Single Family Home                                      Prior Living Arrangements/Services Living arrangements for the past 2 months: Single Family Home Lives with:: Spouse Patient language and need for interpreter reviewed:: Yes        Need for Family Participation in Patient Care: Yes (Comment) Care giver support system in place?: Yes (comment) Current home services:  (home oxygen; pt and wife not sure of company name) Criminal Activity/Legal Involvement Pertinent to Current Situation/Hospitalization: No - Comment as needed  Activities of Daily Living Home Assistive Devices/Equipment: Cane (specify quad or straight), Dentures (specify type), Eyeglasses, Oxygen ADL Screening (condition at time of admission) Patient's cognitive  ability adequate to safely complete daily activities?: Yes Is the patient deaf or have difficulty hearing?: No Does the patient have difficulty seeing, even when wearing glasses/contacts?: No Does the patient have difficulty concentrating, remembering, or making decisions?: No Patient able to express need for assistance with ADLs?: Yes Does the patient have difficulty dressing or bathing?: No Independently performs ADLs?: Yes (appropriate for developmental age) Does the patient have difficulty walking or climbing stairs?: No Weakness of Legs: None Weakness of Arms/Hands: Left  Permission Sought/Granted Permission sought to share information with : Case Manager Permission granted to share information with : Yes, Verbal Permission Granted  Share Information with NAME: Bradley Bunting, RN, CM           Emotional Assessment   Attitude/Demeanor/Rapport: Gracious Affect (typically observed): Accepting Orientation: : Oriented to Self, Oriented to Place, Oriented to  Time, Oriented to Situation   Psych Involvement: No (comment)  Admission diagnosis:  COPD exacerbation (HCC) [J44.1] Acute respiratory failure with hypoxemia (HCC) [J96.01] Patient Active Problem List   Diagnosis Date Noted   Acute respiratory failure with hypoxemia (HCC) 08/27/2022   Left lower lobe pneumonia 08/27/2022   Hypophosphatemia 08/27/2022   COVID 08/23/2022   Displaced fracture of shaft of third metacarpal bone, left hand, initial encounter for closed fracture 08/03/2022   ILD (interstitial lung disease) (HCC) 07/30/2022  CAD (coronary artery disease) 07/19/2022   Acute on chronic diastolic heart failure ([place]) 07/18/2022   Acute on chronic respiratory failure with hypoxia ([place]) 07/18/2022   GERD without esophagitis 07/30/2021   Cor pulmonale (chronic) ([place]) 08/05/2020   COPD exacerbation ([place]) 12/22/2018   COPD GOLD 0 spirometry/ AB  12/22/2018   PCP NOTES >>> 07/15/2015   Annual physical exam 05/08/2012    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   Hypokalemia 09/23/2011   Sinoatrial node dysfunction ([place])    Non-ischemic cardiomyopathy ([place]) 09/06/2011   Chronic diastolic heart failure (HCC)    PAD (peripheral artery disease) ([place]) 10/22/2010   DERMATITIS, STASIS 09/14/2010   Chronic respiratory failure with hypoxia and hypercapnia ([place]) 09/14/2010   Chronic pulmonary hypertension secondary to elevated L H pressures  04/15/2009   ERECTILE DYSFUNCTION 01/06/2009   Mixed diabetic hyperlipidemia associated with type 2 diabetes mellitus ([place]) 12/13/2007   Essential hypertension 12/13/2007   Anxiety--insomnia 08/14/2007   Type 2 diabetes mellitus ([place]) 01/02/2007   Atrial fibrillation ([place]) 01/02/2007   Benign enlargement of prostate 01/02/2007   PCP:  Bradley Branch, MD Pharmacy:   [place], [place] [address] Phone: [phone] Fax: [place] [id number] - [place], [place] - [address] [place] [address] [place] [address] Phone: [phone] Fax: [phone]     Social Determinants of Health (SDOH) Interventions    Readmission Risk Interventions    08/29/2022    3:33 PM 09/02/2021   10:31 AM 08/03/2021    9:10 AM  Readmission Risk Prevention Plan  Transportation Screening Complete Complete Complete  PCP or Specialist Appt within 5-7 Days   Complete  PCP or Specialist Appt within 3-5 Days Complete Complete   Home Care Screening   Complete  Medication Review (RN CM)   Complete  HRI or Home Care Consult Complete Complete   Social Work Consult for [place] Planning/Counseling Complete Complete   Palliative Care Screening Not Applicable Not Applicable   Medication Review Press photographer) Complete Complete

## 2022-08-29 NOTE — Progress Notes (Signed)
No need of bipap at this time.  

## 2022-08-29 NOTE — Progress Notes (Addendum)
Patient glucose reads 65 @ 12:00pm. Patient asymptomatic and given juice to drink. Will recheck CBG. CBG rechecked, reads 162.

## 2022-08-29 NOTE — Plan of Care (Signed)
  Problem: Education: Goal: Knowledge of risk factors and measures for prevention of condition will improve Outcome: Progressing   Problem: Coping: Goal: Psychosocial and spiritual needs will be supported Outcome: Progressing   

## 2022-08-29 NOTE — Progress Notes (Addendum)
Hand off to on 11 pm RN. Pt sleeping without distress, arouses easily.SRP, RN

## 2022-08-30 ENCOUNTER — Telehealth: Payer: Self-pay

## 2022-08-30 ENCOUNTER — Inpatient Hospital Stay (HOSPITAL_COMMUNITY): Payer: Medicare Other

## 2022-08-30 ENCOUNTER — Ambulatory Visit (INDEPENDENT_AMBULATORY_CARE_PROVIDER_SITE_OTHER): Payer: Medicare Other

## 2022-08-30 ENCOUNTER — Inpatient Hospital Stay: Payer: BLUE CROSS/BLUE SHIELD | Admitting: Internal Medicine

## 2022-08-30 DIAGNOSIS — J9601 Acute respiratory failure with hypoxia: Secondary | ICD-10-CM | POA: Diagnosis not present

## 2022-08-30 DIAGNOSIS — I10 Essential (primary) hypertension: Secondary | ICD-10-CM | POA: Diagnosis not present

## 2022-08-30 DIAGNOSIS — I5032 Chronic diastolic (congestive) heart failure: Secondary | ICD-10-CM | POA: Diagnosis not present

## 2022-08-30 DIAGNOSIS — J189 Pneumonia, unspecified organism: Secondary | ICD-10-CM | POA: Diagnosis not present

## 2022-08-30 DIAGNOSIS — I495 Sick sinus syndrome: Secondary | ICD-10-CM

## 2022-08-30 LAB — GLUCOSE, CAPILLARY
Glucose-Capillary: 247 mg/dL — ABNORMAL HIGH (ref 70–99)
Glucose-Capillary: 96 mg/dL (ref 70–99)

## 2022-08-30 LAB — TROPONIN I (HIGH SENSITIVITY)
Troponin I (High Sensitivity): 10 ng/L (ref <18)
Troponin I (High Sensitivity): 11 ng/L (ref <18)

## 2022-08-30 MED ORDER — AZITHROMYCIN 500 MG PO TABS: 500.0000 mg | ORAL_TABLET | Freq: Every day | ORAL | 0 refills | Status: AC

## 2022-08-30 MED ORDER — PREDNISONE 20 MG PO TABS: 40.0000 mg | ORAL_TABLET | Freq: Every day | ORAL | 0 refills | Status: AC

## 2022-08-30 MED ORDER — CEFDINIR 300 MG PO CAPS: 300.0000 mg | ORAL_CAPSULE | Freq: Two times a day (BID) | ORAL | 0 refills | Status: AC

## 2022-08-30 NOTE — Care Management Important Message (Signed)
Important Message  Patient Details IM Letter given. Name: Bradley HODES Sr. MRN: [id number] Date of Birth: 25-Sep-1941   Medicare Important Message Given:  Yes     Caren Macadam 08/30/2022, 12:21 PM

## 2022-08-30 NOTE — Progress Notes (Signed)
Mobility Specialist - Progress Note   08/30/22 1121  Oxygen Therapy  O2 Device Nasal Cannula  O2 Flow Rate (L/min) 4 L/min  Mobility  Activity Ambulated with assistance in hallway  Level of Assistance Modified independent, requires aide device or extra time  Assistive Device Front wheel walker  Distance Ambulated (ft) 240 ft  Activity Response Tolerated well  Mobility Referral Yes  $Mobility charge 1 Mobility   Pt received in bed and agreeable to mobility. C/o pain on top of head prior to ambulating. Pt also mentioned he had from R upper chest pain while ambulating, but stated it only hurts when walking. Nurse notified. Pt O2 dropped to 85% during ambulation but pt stated he felt fine. Pt was able to recover back to 99% in a matter of seconds. Pt to bed after session with all needs met.    During mobility: 87bpm HR, 85% SpO2 Post-mobility: 85bpm HR, 99%% SPO2  Set designer

## 2022-08-30 NOTE — Discharge Summary (Signed)
Physician Discharge Summary   Patient: Bradley TOOTHMAN Sr. MRN: [id number] DOB: [birth year]  Admit date:     08/27/2022  Discharge date: 08/30/22  Discharge Physician: Cordelia Poche, MD   PCP: Colon Branch, MD   Recommendations at discharge:  PCP follow-up  Discharge Diagnoses: Principal Problem:   CAP (community acquired pneumonia) Active Problems:   Type 2 diabetes mellitus ([place])   Essential hypertension   Atrial fibrillation ([place])   Benign enlargement of prostate   Chronic respiratory failure with hypoxia and hypercapnia (H[facility])   PAD (peripheral artery disease) ([place])   Chronic diastolic heart failure (H[facility])   Hypokalemia   COPD exacerbation ([place])   CAD (coronary artery disease)   COVID   Acute respiratory failure with hypoxemia ([place])   Left lower lobe pneumonia   Hypophosphatemia   GERD without esophagitis  Resolved Problems:   * No resolved hospital problems. *  Hospital Course: Bradley CORBIT Sr. is a 81 y.o. male with a history of anemia, osteoarthritis, chronic atrial fibrillation status post AV nodal ablation and biventricular pacemaker, BPH, bronchitis, CAD, chronic diastolic heart failure, pulmonary hypertension, COPD, chronic respiratory failure, hyperlipidemia, hypertension, diabetes mellitus type 2, intracranial bleed, migraine headache, insomnia, pleural effusions.  Patient presented secondary to shortness of breath and was found to have evidence of community-acquired pneumonia in setting of recent COVID-19 infection.  Patient started empirically on ceftriaxone and azithromycin.  Patient with evidence of associated COPD exacerbation was started on steroids as well. Patient with continued improvement towards baseline.  Assessment and Plan:  Community acquired pneumonia Left lower lobe pneumonia Recent COVID infection.  Patient started empirically on ceftriaxone and azithromycin.  No increased oxygen requirement.  Sputum culture ordered and is pending.  No blood cultures  obtained on admission.  Patient improved with antibiotic therapy.  Patient discharged with cefdinir and azithromycin to complete a 5-day course of antibiotics.  Incentive spirometer on discharge as well.   History of COVID-19 infection Patient recently diagnosed with COVID-19 infection on 11/13.  At that time he was managed with steroids with taper.  Current presentation does not appear to be related to persistent COVID-19 infection.  Last day of isolation is 09/02/2022.  Prednisone started here for COPD exacerbation.   COPD exacerbation Secondary to associated pneumonia.  Patient started on prednisone burst and was discharged on prednisone to complete a 5-day course.   Hypokalemia Potassium of 3.2 on admission. Resolved with potassium supplementation.   Hypophosphatemia Phosphorus of 1.8 on admission. Supplementation provided with resolution of hypophosphatemia.   Primary hypertension Continue bisoprolol and amlodipine.   Diabetes mellitus type 2 Controlled.  Most recent Hemoglobin A1c of 6.2%.  Patient is managed on Farxiga as an outpatient. Continue regimen on discharge.   Atrial fibrillation, unspecified Patient previously on anticoagulation which was discontinued secondary to history of intracranial hemorrhage.  Continue bisoprolol on discharge.   Consultants: None Procedures performed: None  Disposition: Home Diet recommendation: Regular diet   DISCHARGE MEDICATION: Allergies as of 08/30/2022       Reactions   Hydrocodone Other (See Comments)   "given to him in the hospital; went thru withdrawals once home; dr said not to take it again" (09/27/2012) dizziness   Ultram [tramadol] Other (See Comments)   "given to him in the hospital; went thru withdrawals once home; dr said not to take it again" (09/27/2012) dizziness   Cialis [tadalafil] Other (See Comments)   Headache Backache  Dizziness    Avelox [moxifloxacin Hydrochloride] Itching, Other (See  Comments)    Headache Dizziness        Medication List     STOP taking these medications    carvedilol 25 MG tablet Commonly known as: COREG       TAKE these medications    acetaminophen 500 MG tablet Commonly known as: TYLENOL Take 1,000 mg by mouth every 8 (eight) hours as needed for headache or moderate pain.   albuterol 108 (90 Base) MCG/ACT inhaler Commonly known as: VENTOLIN HFA Inhale 2 puffs into the lungs every 6 (six) hours as needed for wheezing or shortness of breath. What changed:  how much to take when to take this   amLODipine 10 MG tablet Commonly known as: NORVASC Take 1 tablet (10 mg total) by mouth daily. What changed: when to take this   aspirin 81 MG tablet Take 81 mg by mouth every morning.   atorvastatin 20 MG tablet Commonly known as: LIPITOR Take 1 tablet (20 mg total) by mouth at bedtime. What changed: when to take this   azithromycin 500 MG tablet Commonly known as: ZITHROMAX Take 1 tablet (500 mg total) by mouth daily for 1 day. Start taking on: August 31, 2022   bisoprolol 10 MG tablet Commonly known as: ZEBETA Take 1 tablet (10 mg total) by mouth daily.   budesonide 0.5 MG/2ML nebulizer solution Commonly known as: PULMICORT Take 2 mLs (0.5 mg total) by nebulization 2 (two) times daily.   calcium-vitamin D 500-200 MG-UNIT tablet Commonly known as: OSCAL WITH D Take 1 tablet by mouth every morning.   cefdinir 300 MG capsule Commonly known as: OMNICEF Take 1 capsule (300 mg total) by mouth 2 (two) times daily for 1 day. Start taking on: August 31, 2022   cholecalciferol 25 MCG (1000 UNIT) tablet Commonly known as: VITAMIN D3 Take 1,000 Units by mouth every evening.   diphenhydramine-acetaminophen 25-500 MG Tabs tablet Commonly known as: TYLENOL PM Take 1 tablet by mouth at bedtime as needed (sleep). What changed: Another medication with the same name was removed. Continue taking this medication, and follow the directions you  see here.   Farxiga 10 MG Tabs tablet Generic drug: dapagliflozin propanediol TAKE 1 TABLET DAILY BEFORE BREAKFAST What changed:  how much to take when to take this   furosemide 40 MG tablet Commonly known as: LASIX TAKE 2 TABLETS EVERY MORNING AND 1 TABLET LATE IN THE AFTERNOON What changed:  how much to take how to take this when to take this additional instructions   guaiFENesin 600 MG 12 hr tablet Commonly known as: MUCINEX Take 600 mg by mouth 2 (two) times daily.   ipratropium-albuterol 0.5-2.5 (3) MG/3ML Soln Commonly known as: DUONEB Take 3 mLs by nebulization every 6 (six) hours as needed. What changed: when to take this   multivitamins ther. w/minerals Tabs tablet Take 1 tablet by mouth daily. What changed: when to take this   nitroGLYCERIN 0.4 MG SL tablet Commonly known as: NITROSTAT Place 1 tablet (0.4 mg total) under the tongue every 5 (five) minutes x 3 doses as needed for chest pain.   onetouch ultrasoft lancets Check blood sugars no more than twice daily   OneTouch Verio test strip Generic drug: glucose blood USE TO CHECK BLOOD SUGAR NO MORE THAN TWICE A DAY   pantoprazole 40 MG tablet Commonly known as: PROTONIX Take 1 tablet (40 mg total) by mouth daily before breakfast.   predniSONE 20 MG tablet Commonly known as: DELTASONE Take 2 tablets (40 mg total) by  mouth daily with breakfast for 2 days. Start taking on: August 31, 2022 What changed:  medication strength how much to take how to take this when to take this additional instructions   tamsulosin 0.4 MG Caps capsule Commonly known as: FLOMAX Take 1 capsule (0.4 mg total) by mouth daily after supper.   zolpidem 10 MG tablet Commonly known as: AMBIEN Take 10 mg by mouth at bedtime as needed for sleep.        Follow-up Information     [name], MD. Schedule an appointment as soon as possible for a visit in 1 week(s).   Specialty: Internal Medicine Why: For hospital  follow-up Contact information: [address] [place] [address] [phone]                Discharge Exam: BP 101/67 (BP Location: Right Arm)   Pulse 67   Temp (!) 97.5 F (36.4 C) (Oral)   Resp (!) 23   Ht 5\' 9"  (1.753 m)   Wt 78.3 kg   SpO2 94%   BMI 25.49 kg/m   General exam: Appears calm and comfortable Respiratory system: Clear to auscultation. Cardiovascular system: S1 & S2 heard, RRR. Gastrointestinal system: Abdomen is distended, soft and nontender. Normal bowel sounds heard. Central nervous system: Alert and oriented. Musculoskeletal: No edema. No calf tenderness. Reproducible chest pain Skin: No cyanosis. No rashes Psychiatry: Judgement and insight appear normal. Mood & affect appropriate.   Condition at discharge: fair  The results of significant diagnostics from this hospitalization (including imaging, microbiology, ancillary and laboratory) are listed below for reference.   Imaging Studies: DG CHEST PORT 1 VIEW  Result Date: 08/30/2022 CLINICAL DATA:  81 year old male with chest pain. EXAM: PORTABLE CHEST 1 VIEW COMPARISON:  Portable chest 08/27/2022 and earlier. FINDINGS: Portable AP semi upright view at 1003 hours. Stable left chest dual lead cardiac pacemaker. Lower lung volumes and increased, platelike left lingula or lung base opacity from 3 days ago. Similar lung volumes. No pneumothorax or pulmonary edema. No pleural effusion or other confluent opacity identified. Stable visualized osseous structures.  Negative visible bowel gas. IMPRESSION: Lower lung volumes with increased left lung plate-like opacity from 3 days ago. This is nonspecific but could be atelectasis. No pulmonary edema or pleural effusion identified. Electronically Signed   By: 08/29/2022 M.D.   On: 08/30/2022 10:29   DG Chest Port 1 View  Result Date: 08/27/2022 CLINICAL DATA:  Hypoxia, shortness of breath and chills. EXAM: PORTABLE CHEST 1 VIEW COMPARISON:  PA and  lateral 08/23/2022 FINDINGS: 5:17 a.m. Left chest dual lead pacing system and wire insertions are unaltered. The aorta is tortuous and ectatic with calcification of the transverse segment and stable mediastinum. Cardiomegaly appears similar. There is mild central vascular prominence without overt edema. Today there is worsening patchy consolidation in the left lower lung field and the right infrahilar area, most likely due to pneumonia or aspiration. The remainder of the lungs remain clear. No pleural effusion is seen. In all other respects no further changes. 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 IMPRESSION: 1. Worsening patchy consolidation in the left lower lung field and the right infrahilar area, most likely due to pneumonia or aspiration. Follow-up study recommended to ensure clearing. 2. Cardiomegaly and mild central vascular prominence without overt edema. 3. Aortic atherosclerosis. Electronically Signed   By: 08/25/2022 M.D.   On: 08/27/2022 05:31   DG Chest 2 View  Result Date: 08/23/2022 CLINICAL DATA:  81 year old male with shortness of breath and frequent community-acquired pneumonia is. EXAM: CHEST - 2 VIEW COMPARISON:  Chest radiographs 07/30/2022 and earlier. FINDINGS: PA and lateral views at 0533 hours. Stable left chest dual lead cardiac pacemaker. Stable lung volumes and mediastinal contours. Visualized tracheal air column is within normal limits. No pneumothorax, pulmonary edema or definite pleural effusion. Chronic platelike atelectasis or scarring in the lingula or right middle lobe does not appear significantly changed since February radiographs (lateral). And no acute or confluent pulmonary opacity is identified. Stable visualized osseous structures.  Negative visible bowel gas. IMPRESSION: Stable chronic lung disease. No acute cardiopulmonary abnormality identified. Electronically Signed   By: Genevie Ann M.D.   On: 08/23/2022 05:47     Microbiology: Results for orders placed or performed during the hospital encounter of 08/27/22  Expectorated Sputum Assessment w Gram Stain, Rflx to Resp Cult     Status: None   Collection Time: 08/27/22  1:58 PM   Specimen: Expectorated Sputum  Result Value Ref Range Status   Specimen Description EXPECTORATED SPUTUM  Final   Special Requests NONE  Final   Sputum evaluation   Final    THIS SPECIMEN IS ACCEPTABLE FOR SPUTUM CULTURE Performed at [facility], [place] [address], [place], [place] [address]    Report Status 08/27/2022 FINAL  Final  Culture, Respiratory w Gram Stain     Status: None   Collection Time: 08/27/22  1:58 PM  Result Value Ref Range Status   Specimen Description   Final    EXPECTORATED SPUTUM Performed at [facility], [place] [address] [place], [place] [address]    Special Requests   Final    NONE Reflexed from [id number] Performed at [facility], [place] [address], [place], [place] [address]    Gram Stain   Final    NO WBC SEEN RARE GRAM POSITIVE COCCI IN PAIRS RARE GRAM POSITIVE RODS RARE GRAM NEGATIVE DIPLOCOCCI    Culture   Final    FEW Normal respiratory flora-no Staph aureus or Pseudomonas seen Performed at [facility], [address], [place], [place] [address]    Report Status 08/29/2022 FINAL  Final    Labs: CBC: Recent Labs  Lab 08/24/22 0300 08/25/22 0554 08/27/22 0515 08/28/22 1017  WBC 3.5* 8.0 7.0 10.5  NEUTROABS 2.6 6.4 5.3 8.6*  HGB 14.4 13.5 13.3 13.6  HCT 42.5 38.7* 40.3 42.0  MCV 92.2 89.8 93.5 94.8  PLT 132* 140* 152 [id number]   Basic Metabolic Panel: Recent Labs  Lab 08/24/22 0300 08/25/22 0554 08/27/22 0515 08/28/22 1017 08/29/22 0350  NA 140 138 140 138  --   K 3.5 3.5 3.2* 4.1  --   CL 101 100 100 100  --   CO2 28 29 32 27  --   GLUCOSE 135* 127* 100* 128*  --   BUN 15 17 20 18   --   CREATININE 0.77 0.85 0.79 0.67  --   CALCIUM 9.0 8.6* 8.4* 8.2*  --    MG  --   --  2.2  --   --   PHOS  --   --  1.8*  --  3.3   Liver Function Tests: Recent Labs  Lab 08/24/22 0300 08/25/22 0554 08/27/22 0515 08/28/22 1017  AST 22 18 23 21   ALT 20 17 19 18   ALKPHOS 66 63 62 56  BILITOT 0.4 0.6 0.8 0.6  PROT  6.9 6.1* 6.6 6.3*  ALBUMIN 3.4* 3.1* 3.4* 3.1*   CBG: Recent Labs  Lab 08/29/22 1246 08/29/22 1701 08/29/22 2032 08/30/22 0753 08/30/22 1106  GLUCAP 162* 192* 172* 247* 96    Discharge time spent: 35 minutes.  Signed: Cordelia Poche, MD [facility] 08/30/2022

## 2022-08-30 NOTE — Telephone Encounter (Signed)
LMOVM letting patient know we did get his voicemail about being in the hospital. I told him he can send the transmission when he get out. I told him if he wants to call us back he can.

## 2022-08-30 NOTE — Discharge Instructions (Signed)
[name],  You were in the hospital with pneumonia and have improved with antibiotics. You also seem to have had an associated COPD exacerbation. Please continue your antibiotics and steroids. Please follow-up with your PCP.

## 2022-08-31 ENCOUNTER — Telehealth: Payer: Self-pay

## 2022-08-31 ENCOUNTER — Ambulatory Visit: Payer: BLUE CROSS/BLUE SHIELD | Admitting: Family Medicine

## 2022-08-31 NOTE — Telephone Encounter (Signed)
Transition Care Management Unsuccessful Follow-up Telephone Call  Date of discharge and from where:  08/30/22, [facility]  Attempts:  1st Attempt  Reason for unsuccessful TCM follow-up call:  Left voice message

## 2022-09-01 ENCOUNTER — Other Ambulatory Visit: Payer: Self-pay | Admitting: Internal Medicine

## 2022-09-01 LAB — CUP PACEART REMOTE DEVICE CHECK
Battery Remaining Longevity: 3 mo
Battery Voltage: 2.77 V
Brady Statistic AP VP Percent: 0 %
Brady Statistic AP VS Percent: 0 %
Brady Statistic AS VP Percent: 98.35 %
Brady Statistic AS VS Percent: 1.65 %
Brady Statistic RA Percent Paced: 0 %
Brady Statistic RV Percent Paced: 99.49 %
Date Time Interrogation Session: 20231121224824
Implantable Lead Connection Status: 753985
Implantable Lead Connection Status: 753985
Implantable Lead Implant Date: 20121128
Implantable Lead Implant Date: 20121128
Implantable Lead Location: 753858
Implantable Lead Location: 753860
Implantable Lead Model: 4194
Implantable Lead Model: 5076
Implantable Pulse Generator Implant Date: 20121128
Lead Channel Impedance Value: 304 Ohm
Lead Channel Impedance Value: 399 Ohm
Lead Channel Impedance Value: 4047 Ohm
Lead Channel Impedance Value: 4047 Ohm
Lead Channel Impedance Value: 437 Ohm
Lead Channel Impedance Value: 475 Ohm
Lead Channel Impedance Value: 494 Ohm
Lead Channel Impedance Value: 570 Ohm
Lead Channel Impedance Value: 646 Ohm
Lead Channel Pacing Threshold Amplitude: 0.5 V
Lead Channel Pacing Threshold Amplitude: 1.125 V
Lead Channel Pacing Threshold Pulse Width: 0.4 ms
Lead Channel Pacing Threshold Pulse Width: 0.4 ms
Lead Channel Sensing Intrinsic Amplitude: 11.625 mV
Lead Channel Sensing Intrinsic Amplitude: 11.625 mV
Lead Channel Setting Pacing Amplitude: 2 V
Lead Channel Setting Pacing Amplitude: 2.25 V
Lead Channel Setting Pacing Pulse Width: 0.4 ms
Lead Channel Setting Pacing Pulse Width: 0.4 ms
Lead Channel Setting Sensing Sensitivity: 4 mV
Zone Setting Status: 755011
Zone Setting Status: 755011

## 2022-09-01 NOTE — Telephone Encounter (Signed)
Transition Care Management Follow-up Telephone Call Date of discharge and from where: 08/30/22 [facility]  How have you been since you were released from the hospital? Per wife patient has been doing great   Any questions or concerns? No  Items Reviewed: Did the pt receive and understand the discharge instructions provided? Yes  Medications obtained and verified? Yes  Other?  N/A Any new allergies since your discharge? No  Dietary orders reviewed? No Do you have support at home? Yes   Home Care and Equipment/Supplies: Were home health services ordered? not applicable If so, what is the name of the agency? N/A  Has the agency set up a time to come to the patient's home? not applicable Were any new equipment or medical supplies ordered?  No What is the name of the medical supply agency? N/A Were you able to get the supplies/equipment? not applicable Do you have any questions related to the use of the equipment or supplies? No  Functional Questionnaire: (I = Independent and D = Dependent) ADLs: I  Bathing/Dressing- I  Meal Prep- I  Eating- I  Maintaining continence- I  Transferring/Ambulation- I  Managing Meds- I  Follow up appointments reviewed:  PCP Hospital f/u appt confirmed?  Patient and wife will have to call us back to schedule  for next week after [facility] f/u appt confirmed?  N/A   Are transportation arrangements needed? No  If their condition worsens, is the pt aware to call PCP or go to the Emergency Dept.? Yes Was the patient provided with contact information for the PCP's office or ED? Yes Was to pt encouraged to call back with questions or concerns? Yes

## 2022-09-06 ENCOUNTER — Encounter: Payer: Self-pay | Admitting: Internal Medicine

## 2022-09-06 ENCOUNTER — Ambulatory Visit (INDEPENDENT_AMBULATORY_CARE_PROVIDER_SITE_OTHER): Payer: Medicare Other | Admitting: Internal Medicine

## 2022-09-06 VITALS — BP 116/62 | HR 81 | Temp 98.1°F | Resp 20 | Ht 69.0 in | Wt 181.2 lb

## 2022-09-06 DIAGNOSIS — I10 Essential (primary) hypertension: Secondary | ICD-10-CM | POA: Diagnosis not present

## 2022-09-06 DIAGNOSIS — E1159 Type 2 diabetes mellitus with other circulatory complications: Secondary | ICD-10-CM | POA: Diagnosis not present

## 2022-09-06 DIAGNOSIS — J189 Pneumonia, unspecified organism: Secondary | ICD-10-CM | POA: Diagnosis not present

## 2022-09-06 LAB — BASIC METABOLIC PANEL
BUN: 10 mg/dL (ref 6–23)
CO2: 33 mEq/L — ABNORMAL HIGH (ref 19–32)
Calcium: 8.7 mg/dL (ref 8.4–10.5)
Chloride: 95 mEq/L — ABNORMAL LOW (ref 96–112)
Creatinine, Ser: 0.84 mg/dL (ref 0.40–1.50)
GFR: 81.75 mL/min (ref >60.00)
Glucose, Bld: 191 mg/dL — ABNORMAL HIGH (ref 70–99)
Potassium: 4.8 mEq/L (ref 3.5–5.1)
Sodium: 134 mEq/L — ABNORMAL LOW (ref 135–145)

## 2022-09-06 LAB — CBC WITH DIFFERENTIAL/PLATELET
Basophils Absolute: 0 10*3/uL (ref 0.0–0.1)
Basophils Relative: 0.3 % (ref 0.0–3.0)
Eosinophils Absolute: 0 10*3/uL (ref 0.0–0.7)
Eosinophils Relative: 0.3 % (ref 0.0–5.0)
HCT: 40.5 % (ref 39.0–52.0)
Hemoglobin: 13.7 g/dL (ref 13.0–17.0)
Lymphocytes Relative: 19.2 % (ref 12.0–46.0)
Lymphs Abs: 2.1 10*3/uL (ref 0.7–4.0)
MCHC: 33.7 g/dL (ref 30.0–36.0)
MCV: 92.8 fl (ref 78.0–100.0)
Monocytes Absolute: 1.1 10*3/uL — ABNORMAL HIGH (ref 0.1–1.0)
Monocytes Relative: 9.8 % (ref 3.0–12.0)
Neutro Abs: 7.8 10*3/uL — ABNORMAL HIGH (ref 1.4–7.7)
Neutrophils Relative %: 70.4 % (ref 43.0–77.0)
Platelets: 412 10*3/uL — ABNORMAL HIGH (ref 150.0–400.0)
RBC: 4.36 Mil/uL (ref 4.22–5.81)
RDW: 15.4 % (ref 11.5–15.5)
WBC: 11.1 10*3/uL — ABNORMAL HIGH (ref 4.0–10.5)

## 2022-09-06 NOTE — Assessment & Plan Note (Signed)
TCM 14 Community-acquired pneumonia: Discharged from the hospital recently, finished antibiotics, he feels back to baseline and doing well. Plan: CMP, CBC. Last chest x-ray was benign, I do not see a need to repeat it at this point. DM: Seems well controlled on Farxiga. HTN: On amlodipine, bisoprolol.  Also Lasix.  Checking labs. RTC as scheduled for 11-2022

## 2022-09-06 NOTE — Progress Notes (Signed)
Subjective:    Patient ID: Bradley Sit., male    DOB: [birth year], 81 y.o.   MRN: [id number]  DOS:  09/06/2022 Type of visit - description: Hospital follow-up, TCM  Discharged from the hospital 08/30/2022. CC was difficulty breathing, was diagnosed with the following Community-acquired pneumonia, treated with antibiotics. COVID-19 infection diagnosed few days before this admission, current pneumonia does not seem to be related with COVID-19. COPD was exacerbated, Rx prednisone. Hypokalemia noted.  Last chest x-ray: Lower lung volumes with increased left lung plate-like opacity from 3 days ago. This is nonspecific but could be atelectasis. No pulmonary edema or pleural effusion identified.   Review of Systems At this point he is doing great. Denies fever. Breathing and cough are at baseline. Denies nausea, vomiting, diarrhea.   Past Medical History:  Diagnosis Date   Abnormal CT scan, chest 2012   CT chest, several lymphadenopathies. Sees pulmonary   Anemia    intermittent   Arthritis    "?back" (09/19/2018)   Atrial fibrillation (HCC)    s/p AV node ablation & BiV PPM implantation 09/08/11 (op dictation pending)   BPH (benign prostatic hyperplasia)    Saw Dr [name] 2004, normal renal u/s   Bronchitis 08/26/2017   CAD (coronary artery disease)    CHF (congestive heart failure) (HCC)    Thought primarily to be non-systolic although EF down (EF 36-46% 12/2010, down to 35-40% 09/05/11), cath 2008 with no CAD, nuclear study 07/2011 showing Small area of reversibility in the distal ant/lat wall the left ventricle suspicious for ischemia/septal wall HK but felt to be low risk  (per D/C Summary 07/2011)   Chronic bronchitis (HCC)    "get it ~ q yr"   Chronic diastolic heart failure (HCC)         Chronic respiratory failure with hypoxia and hypercapnia (HCC) 09/14/2010   Followed in Pulmonary clinic/ [facility]/ [facility]  Started on 02 2lpm at discharge 09/23/11   -  PFT's 10/28/2011  FEV1  1.40 (51%)  with ratio 70 and no better p B2 and DLCO 53 corrects to 101    - PFT's 10/18/2014    FEV1 1.71 (59%) with ratio 68 and no sign change p B2 and DLCO 53 corrects to 85  -  HC03   07/28/20  = 33  -  HCO3   03/17/21     = 31     Heart murmur    HLD (hyperlipidemia)    HTN (hypertension)    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   Pleural effusion 2008   S/p decortication   Pneumonia 08/02/2011   Pulmonary HTN (HCC)    per cath 2008   Type II diabetes mellitus (HCC) 1999    Past Surgical History:  Procedure Laterality Date   BI-VENTRICULAR PACEMAKER INSERTION Left 09/08/2011   Procedure: BI-VENTRICULAR PACEMAKER INSERTION (CRT-P);  Surgeon: Marinus Maw, MD;  Location: [facility] CATH LAB;  Service: Cardiovascular;  Laterality: Left;   CARDIAC CATHETERIZATION     "couple times; never had balloon or stent" (09/19/2018)   CATARACT EXTRACTION W/ INTRAOCULAR LENS  IMPLANT, BILATERAL Bilateral 08/2018   COLONOSCOPY  03/10/11   normal   INSERT / REPLACE / REMOVE PACEMAKER  09/08/11  pacemaker placement   LUNG DECORTICATION     PLEURAL SCARIFICATION     pneumothorax with fibrothorax  ~ 2010   TONSILLECTOMY     "as a kid"     Current Outpatient Medications  Medication Instructions   acetaminophen (TYLENOL) 1,000 mg, Oral, Every 8 hours PRN   albuterol (VENTOLIN HFA) 108 (90 Base) MCG/ACT inhaler 2 puffs, Inhalation, Every 6 hours PRN   amLODipine (NORVASC) 10 mg, Oral, Daily   aspirin 81 mg, Oral, Every morning   atorvastatin (LIPITOR) 20 mg, Oral, Daily at bedtime   bisoprolol (ZEBETA) 10 mg, Oral, Daily   budesonide (PULMICORT) 0.5 mg, Nebulization, 2 times daily   calcium-vitamin D (OSCAL WITH D) 500-200 MG-UNIT tablet 1 tablet, Oral, Every morning   cholecalciferol (VITAMIN  D3) 1,000 Units, Oral, Every evening   diphenhydramine-acetaminophen (TYLENOL PM) 25-500 MG TABS tablet 1 tablet, Oral, At bedtime PRN   FARXIGA 10 MG TABS tablet TAKE 1 TABLET DAILY BEFORE BREAKFAST   furosemide (LASIX) 40 MG tablet TAKE 2 TABLETS EVERY MORNING AND 1 TABLET LATE IN THE AFTERNOON   guaiFENesin (MUCINEX) 600 mg, Oral, 2 times daily   ipratropium-albuterol (DUONEB) 0.5-2.5 (3) MG/3ML SOLN 3 mLs, Nebulization, Every 6 hours PRN   Lancets (ONETOUCH ULTRASOFT) lancets Check blood sugars no more than twice daily   Multiple Vitamins-Minerals (MULTIVITAMINS THER. W/MINERALS) TABS tablet 1 tablet, Oral, Daily   nitroGLYCERIN (NITROSTAT) 0.4 mg, Sublingual, Every 5 min x3 PRN   ONETOUCH VERIO test strip USE TO CHECK BLOOD SUGAR NO MORE THAN TWICE A DAY   pantoprazole (PROTONIX) 40 mg, Oral, Daily before breakfast   tamsulosin (FLOMAX) 0.4 mg, Oral, Daily after supper   zolpidem (AMBIEN) 10 mg, Oral, At bedtime PRN       Objective:   Physical Exam BP 116/62   Pulse 81   Temp 98.1 F (36.7 C) (Oral)   Resp 20   Ht 5\' 9"  (1.753 m)   Wt 181 lb 4 oz (82.2 kg)   SpO2 94% Comment: 4L/min  BMI 26.77 kg/m  General:   Well developed, NAD, BMI noted. HEENT:  Normocephalic . Face symmetric, atraumatic Lungs:  Decreased breath sounds but clear Normal respiratory effort, no intercostal retractions, no accessory muscle use. Heart: RRR,  no murmur.  Lower extremities: no pretibial edema bilaterally  Skin: Not pale. Not jaundice Neurologic:  alert & oriented X3.  Speech normal, gait appropriate for age and unassisted Psych--  Cognition and judgment appear intact.  Cooperative with normal attention span and concentration.  Behavior appropriate. No anxious or depressed appearing.      Assessment   Assessment  DM HTN Hyperlipidemia GERD Anxiety- insomnia  BPH Cardiovascular:Dr  --  Cath x 2 Ladona Ridgel: no significant CAD -- CHF, non-ischemic  --Atrial  fibrillation; h/o AV node ablation, has a Pacemaker (biventricular pacing) --h/o IC bleeding: Not anticoagulated --Peripheral vascular disease Pulmonary: Dr [id number], last OV 10-18-2014, f/u prn --COPD, severe, chronic resp failure, night O2 prn, has portable O2  --Pleural effusion s/p decortication --Pulmonary hypertension  Stasis dermatitis   PLAN: TCM 14 Community-acquired pneumonia: Discharged from the hospital recently, finished antibiotics, he feels back to baseline and doing well. Plan: CMP, CBC. Last chest x-ray was benign, I do not see a need to repeat it at this point. DM: Seems well controlled on Farxiga. HTN: On amlodipine, bisoprolol.  Also Lasix.  Checking labs. RTC as scheduled for 11-2022

## 2022-09-06 NOTE — Patient Instructions (Signed)
Check the  blood pressure regularly BP GOAL is between 110/65 and  135/85. If it is consistently higher or lower, let me know   GO TO THE LAB : Get the blood work    

## 2022-09-07 ENCOUNTER — Ambulatory Visit (INDEPENDENT_AMBULATORY_CARE_PROVIDER_SITE_OTHER): Payer: Medicare Other | Admitting: Family Medicine

## 2022-09-07 ENCOUNTER — Ambulatory Visit (HOSPITAL_BASED_OUTPATIENT_CLINIC_OR_DEPARTMENT_OTHER)
Admission: RE | Admit: 2022-09-07 | Discharge: 2022-09-07 | Disposition: A | Discharge status: Home / Self Care | Payer: Medicare Other | Source: Ambulatory Visit | Attending: Family Medicine | Admitting: Family Medicine

## 2022-09-07 ENCOUNTER — Encounter: Payer: Self-pay | Admitting: Family Medicine

## 2022-09-07 VITALS — BP 120/74 | Ht 69.0 in | Wt 165.0 lb

## 2022-09-07 DIAGNOSIS — S62323D Displaced fracture of shaft of third metacarpal bone, left hand, subsequent encounter for fracture with routine healing: Secondary | ICD-10-CM

## 2022-09-07 NOTE — Assessment & Plan Note (Addendum)
Initial injury on 10/21. No swelling or pain today. Independent review of the left hand xray shows callus formation.  No malrotation or misalignment today..  - counseled on home exercise and supportive care -Can follow-up as needed at this time

## 2022-09-07 NOTE — Patient Instructions (Signed)
Good to see you  Please send me a message in MyChart with any questions or updates.  Please see me back in.   --Dr. Jorryn Hershberger  

## 2022-09-07 NOTE — Progress Notes (Signed)
Bradley Searles Sr. - 81 y.o. male MRN [id number]  Date of birth: 02-01-1941  SUBJECTIVE:  Including CC & ROS.  No chief complaint on file.   Bradley Searles Sr. is a 81 y.o. male that is  following up for his left hand fracture. He has recently suffered two bouts of pneumonia. His hand is felling well with no pain.    Review of Systems See HPI   HISTORY: Past Medical, Surgical, Social, and Family History Reviewed & Updated per EMR.   Pertinent Historical Findings include:  Past Medical History:  Diagnosis Date   Abnormal CT scan, chest 2012   CT chest, several lymphadenopathies. Sees pulmonary   Anemia    intermittent   Arthritis    "?back" (09/19/2018)   Atrial fibrillation (HCC)    s/p AV node ablation & BiV PPM implantation 09/08/11 (op dictation pending)   BPH (benign prostatic hyperplasia)    Saw Dr [name] 2004, normal renal u/s   Bronchitis 08/26/2017   CAD (coronary artery disease)    CHF (congestive heart failure) (HCC)    Thought primarily to be non-systolic although EF down (EF 40-98% 12/2010, down to 35-40% 09/05/11), cath 2008 with no CAD, nuclear study 07/2011 showing Small area of reversibility in the distal ant/lat wall the left ventricle suspicious for ischemia/septal wall HK but felt to be low risk  (per D/C Summary 07/2011)   Chronic bronchitis (HCC)    "get it ~ q yr"   Chronic diastolic heart failure (HCC)         Chronic respiratory failure with hypoxia and hypercapnia (HCC) 09/14/2010   Followed in Pulmonary clinic/ [facility]/ [facility]  Started on 02 2lpm at discharge 09/23/11   - PFT's 10/28/2011  FEV1  1.40 (51%)  with ratio 70 and no better p B2 and DLCO 53 corrects to 101    - PFT's 10/18/2014    FEV1 1.71 (59%) with ratio 68 and no sign change p B2 and DLCO 53 corrects to 85  -  HC03   07/28/20  = 33  -  HCO3   03/17/21     = 31     Heart murmur    HLD (hyperlipidemia)    HTN (hypertension)    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   Pleural effusion 2008   S/p decortication   Pneumonia 08/02/2011   Pulmonary HTN (HCC)    per cath 2008   Type II diabetes mellitus (HCC) 1999    Past Surgical History:  Procedure Laterality Date   BI-VENTRICULAR PACEMAKER INSERTION Left 09/08/2011   Procedure: BI-VENTRICULAR PACEMAKER INSERTION (CRT-P);  Surgeon: Marinus Maw, MD;  Location[facility] CATH LAB;  Service: Cardiovascular;  Laterality: Left;   CARDIAC CATHETERIZATION     "couple times; never had balloon or stent" (09/19/2018)   CATARACT EXTRACTION W/ INTRAOCULAR LENS  IMPLANT, BILATERAL Bilateral 08/2018   COLONOSCOPY  03/10/11   normal   INSERT / REPLACE / REMOVE PACEMAKER  09/08/11   pacemaker placement   LUNG DECORTICATION     PLEURAL SCARIFICATION     pneumothorax with fibrothorax  ~ 2010   TONSILLECTOMY     "as a kid"      PHYSICAL EXAM:  VS:  BP 120/74   Ht 5\' 9"  (1.753 m)   Wt 165 lb (74.8 kg)   BMI 24.37 kg/m  Physical Exam Gen: NAD, alert, cooperative with exam, well-appearing MSK:  Neurovascularly intact       ASSESSMENT & PLAN:   Displaced fracture of shaft of third metacarpal bone, left hand, sub encounter for closed fracture Initial injury on 10/21. No swelling or pain today. Independent review of the left hand xray shows callus formation.  No malrotation or misalignment today..  - counseled on home exercise and supportive care -Can follow-up as needed at this time

## 2022-09-09 ENCOUNTER — Ambulatory Visit (INDEPENDENT_AMBULATORY_CARE_PROVIDER_SITE_OTHER): Payer: Medicare Other | Admitting: Nurse Practitioner

## 2022-09-09 ENCOUNTER — Encounter: Payer: Self-pay | Admitting: Nurse Practitioner

## 2022-09-09 VITALS — BP 132/80 | HR 91 | Ht 69.0 in | Wt 164.8 lb

## 2022-09-09 DIAGNOSIS — J9612 Chronic respiratory failure with hypercapnia: Secondary | ICD-10-CM

## 2022-09-09 DIAGNOSIS — I5032 Chronic diastolic (congestive) heart failure: Secondary | ICD-10-CM | POA: Diagnosis not present

## 2022-09-09 DIAGNOSIS — J9611 Chronic respiratory failure with hypoxia: Secondary | ICD-10-CM | POA: Diagnosis not present

## 2022-09-09 DIAGNOSIS — J849 Interstitial pulmonary disease, unspecified: Secondary | ICD-10-CM | POA: Diagnosis not present

## 2022-09-09 DIAGNOSIS — J449 Chronic obstructive pulmonary disease, unspecified: Secondary | ICD-10-CM | POA: Diagnosis not present

## 2022-09-09 NOTE — Assessment & Plan Note (Signed)
Appears euvolemic on exam. Weight is down to 164 lb today. Encouraged him to monitor his weights and notify his cardiologist if he gains 2-3 lb overnight or 5 lb in a week. Follow up with cardiology as scheduled.

## 2022-09-09 NOTE — Progress Notes (Signed)
  ID: Bradley [name] Sr., male    DOB: Jul 28, 1941, 81 y.o.   MRN: [id number]  Chief Complaint  Patient presents with   Follow-up    Pt f/u he feels like he is doing well, currently using 4L POC    Referring provider: [name], MD  HPI: 81 year old male, former smoker followed for COPD, cor pulmonale, and chronic respiratory failure on supplemental O2. He is a patient of [name] and last seen in office on 08/09/2022 by [facility] NP. Past medical history significant for HTN, CHF, non-ischemic cardiomyopathy, sinoatrial node dysfunction s/p pacemaker, CAD, GERD, DM, HLD, anxiety.   He was recently admitted from 07/18/2022 to 07/23/2022 for acute on chronic respiratory failure related to rhinovirus, AECOPD, and acute CHF exacerbation. He had significant O2 requirements; initially requiring BiPAP and transitioned to 10 lpm HFNC. He was treated with IV steroids, diuretics, and empiric doxycycline. Concerned for possible underlying ILD with groundglass opacities on imaging; advised outpatient follow up. Discharged on triple therapy nebs with duonebs q 6h and budesonide Twice daily.   TEST/EVENTS:  10/28/2011 PFT: FEV1 51%, ratio 70. No BD. DLCO 53, corrects to 101 10/18/2014 PFT: FEV1 59%, ratio 68, no BD. DLCO corrects to 85 07/30/2021 CTA chest: no evidence of PE. Mildly prominent mediastinal nodes, stable. Subcarinal node, stable. Mosaic attenuation with air trapping. Changes of mucous plugging in the lower lobes b/l with mild atelectatic changes.   03/19/2021: OV with [name]. Recent admission from 03/05/2021-03/11/2021 for acute on chronic respiratory failure r/t CAP, AECOPD and decompensated HF. Discharged on baseline 2-3 lpm supplemental O2.   07/30/2022: OV with Tammie Ellsworth NP for hospital follow up. He was seen by his PCP, [name], on 10/17 and noted to be 89% on 3 lpm POC; he was advised to increase to 4 lpm until he was seen here. He was also noted to still have some wheezing on exam; decided to  hold off on further prednisone. Today, he tells me that he is doing better since he was discharged home. He still feels more short winded from his baseline. He's not quite back to his normal activities and hasn't been doing as much around the house. His cough is better; occasionally productive with clear to rarely green sputum. He has been wheezing still. Denies fevers, chills, leg swelling, hemoptysis, orthopnea, PND, night sweats, weight gain. Completes budesonide nebs twice a day. Only doing duonebs twice daily. He takes lasix 40 mg once a day. Not consistently monitoring his oxygen levels. CXR with persistent edema; advised to increase lasix to 40 mg Twice daily for 5 days then resume to 40 mg daily. Treated for possible concurrent AECOPD with prednisone taper.   08/09/2022: OV with Lakeena Downie NP for follow-up.  He is feeling much better since he was here last.  He has 1 day left of steroids.  He is back on his 40 mg of Lasix.  Feels like his breathing is back to his baseline.  No significant cough or increased chest congestion.  Has not noticed any wheezing.  Denies fevers, chills, leg swelling, orthopnea. He continues on triple therapy nebs with duoneb and budesonide. Wearing supplemental oxygen 4 lpm POC. Unfortunately, weight was up around 20 lb. Oddly appeared euvolemic on exam. BMET checked. Discussed with Dr. Ladona Ridgel, cardiologist, who agreed with increased lasix dosing. Instructed him to take 40 mg in AM and 20 mg in PM. Advised him to closely monitor his weights and follow up with cardiology. Previous ground  glass on imaging; plan for HRCT end of December to assess for ILD and repeat PFTs.  09/09/2022: Today - follow up Patient presents today for hospital follow up. Unfortunately, after I saw him last, he contracted COVID. He was hospitalized from 11/13-11/15 and treated for AECOPD r/t COVID infection. He was treated with steroid taper. He then represented to the ED on 11/17 with worsening shortness of  breath and low oxygen levels. He was admitted for CAP with patchy consolidation in the left lower lung and right infrahilar area. He was treated with IV antibiotics and transitioned to cefdinir and azithromycin upon discharge on 11/20 to complete 5 day course. He has been feeling well since discharged. Feels like his breathing is the best it's been in a while. No residual effects from COVID. Working on building his stamina back up. Denies any increased cough, chest congestion, leg swelling, orthopnea, PND. Weight is down almost 40 lb since he was here last. Monitoring weights at home. He's back on lasix 40 mg daily. He continues on duonebs every 6 hours and budesonide twice daily. No increased O2 demand.   Allergies  Allergen Reactions   Hydrocodone Other (See Comments)    "given to him in the hospital; went thru withdrawals once home; dr said not to take it again" (09/27/2012) dizziness   Ultram [Tramadol] Other (See Comments)    "given to him in the hospital; went thru withdrawals once home; dr said not to take it again" (09/27/2012) dizziness   Cialis [Tadalafil] Other (See Comments)    Headache Backache  Dizziness    Avelox [Moxifloxacin Hydrochloride] Itching and Other (See Comments)    Headache Dizziness    Immunization History  Administered Date(s) Administered   Fluad Quad(high Dose 65+) 06/26/2019, 08/11/2020, 07/13/2022   Influenza Split 10/01/2011   Influenza Whole 08/14/2007, 07/11/2009, 09/14/2010   Influenza, High Dose Seasonal PF 09/14/2013, 07/15/2015, 07/09/2016, 07/05/2017, 08/03/2018   Influenza,inj,Quad PF,6+ Mos 07/05/2014   Influenza-Unspecified 08/11/2012, 08/11/2021   Janssen (J&J) SARS-COV-2 Vaccination 01/12/2020, 09/19/2020   PFIZER Comirnaty(Gray Top)Covid-19 Tri-Sucrose Vaccine 03/18/2021   Pfizer Covid-19 Vaccine Bivalent Booster 69yrs & up 08/11/2021   Pneumococcal Conjugate-13 03/12/2015   Pneumococcal Polysaccharide-23 07/11/2004, 03/30/2009,  11/01/2017, 05/11/2019   Td 03/12/2003   Tetanus 09/14/2013   Zoster Recombinat (Shingrix) 03/11/2022   Zoster, Live 04/20/2011    Past Medical History:  Diagnosis Date   Abnormal CT scan, chest 2012   CT chest, several lymphadenopathies. Sees pulmonary   Anemia    intermittent   Arthritis    "?back" (09/19/2018)   Atrial fibrillation (HCC)    s/p AV node ablation & BiV PPM implantation 09/08/11 (op dictation pending)   BPH (benign prostatic hyperplasia)    Saw Dr [name] 2004, normal renal u/s   Bronchitis 08/26/2017   CAD (coronary artery disease)    CHF (congestive heart failure) (HCC)    Thought primarily to be non-systolic although EF down (EF 18-84% 12/2010, down to 35-40% 09/05/11), cath 2008 with no CAD, nuclear study 07/2011 showing Small area of reversibility in the distal ant/lat wall the left ventricle suspicious for ischemia/septal wall HK but felt to be low risk  (per D/C Summary 07/2011)   Chronic bronchitis (HCC)    "get it ~ q yr"   Chronic diastolic heart failure (HCC)         Chronic respiratory failure with hypoxia and hypercapnia (HCC) 09/14/2010   Followed in Pulmonary clinic/ [facility]/ [place]  Started on 02 2lpm at discharge 09/23/11   -  PFT's 10/28/2011  FEV1  1.40 (51%)  with ratio 70 and no better p B2 and DLCO 53 corrects to 101    - PFT's 10/18/2014    FEV1 1.71 (59%) with ratio 68 and no sign change p B2 and DLCO 53 corrects to 85  -  HC03   07/28/20  = 33  -  HCO3   03/17/21     = 31     Heart murmur    HLD (hyperlipidemia)    HTN (hypertension)    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   Pleural effusion 2008   S/p decortication   Pneumonia 08/02/2011   Pulmonary HTN (HCC)    per cath 2008   Type II diabetes mellitus (HCC) 1999    Tobacco History: Social  History   Tobacco Use  Smoking Status Former   Packs/day: 2.50   Years: 25.00   Total pack years: 62.50   Types: Cigarettes, Cigars   Quit date: 10/11/1980   Years since quitting: 41.9  Smokeless Tobacco Never   Counseling given: Not Answered   Outpatient Medications Prior to Visit  Medication Sig Dispense Refill   acetaminophen (TYLENOL) 500 MG tablet Take 1,000 mg by mouth every 8 (eight) hours as needed for headache or moderate pain.     albuterol (VENTOLIN HFA) 108 (90 Base) MCG/ACT inhaler Inhale 2 puffs into the lungs every 6 (six) hours as needed for wheezing or shortness of breath. (Patient taking differently: Inhale 1 puff into the lungs 2 (two) times daily.) 8.5 g 0   amLODipine (NORVASC) 10 MG tablet Take 1 tablet (10 mg total) by mouth daily. (Patient taking differently: Take 10 mg by mouth in the morning.) 90 tablet 1   aspirin 81 MG tablet Take 81 mg by mouth every morning.      atorvastatin (LIPITOR) 20 MG tablet Take 1 tablet (20 mg total) by mouth at bedtime. (Patient taking differently: Take 20 mg by mouth every evening.) 90 tablet 1   bisoprolol (ZEBETA) 10 MG tablet Take 1 tablet (10 mg total) by mouth daily. 90 tablet 0   budesonide (PULMICORT) 0.5 MG/2ML nebulizer solution Take 2 mLs (0.5 mg total) by nebulization 2 (two) times daily. 360 mL 1   calcium-vitamin D (OSCAL WITH D) 500-200 MG-UNIT tablet Take 1 tablet by mouth every morning.     cholecalciferol (VITAMIN D3) 25 MCG (1000 UNIT) tablet Take 1,000 Units by mouth every evening.     diphenhydramine-acetaminophen (TYLENOL PM) 25-500 MG TABS tablet Take 1 tablet by mouth at bedtime as needed (sleep).     FARXIGA 10 MG TABS tablet TAKE 1 TABLET DAILY BEFORE BREAKFAST (Patient taking differently: Take 10 mg by mouth in the morning.) 90 tablet 3   furosemide (LASIX) 40 MG tablet TAKE 2 TABLETS EVERY MORNING AND 1 TABLET LATE IN THE AFTERNOON (Patient taking differently: Take 40 mg by mouth in the morning.) 270 tablet  3   guaiFENesin (MUCINEX) 600 MG 12 hr tablet Take 600 mg by mouth 2 (two) times daily.     ipratropium-albuterol (DUONEB) 0.5-2.5 (3) MG/3ML SOLN Take 3 mLs by nebulization every 6 (six) hours as needed. (Patient taking differently: Take 3 mLs by nebulization in the morning and at bedtime.)  360 mL 1   Lancets (ONETOUCH ULTRASOFT) lancets Check blood sugars no more than twice daily 200 each 12   Multiple Vitamins-Minerals (MULTIVITAMINS THER. W/MINERALS) TABS tablet Take 1 tablet by mouth daily. (Patient taking differently: Take 1 tablet by mouth in the morning.) 30 each 5   nitroGLYCERIN (NITROSTAT) 0.4 MG SL tablet Place 1 tablet (0.4 mg total) under the tongue every 5 (five) minutes x 3 doses as needed for chest pain. 25 tablet 3   ONETOUCH VERIO test strip USE TO CHECK BLOOD SUGAR NO MORE THAN TWICE A DAY 200 strip 12   pantoprazole (PROTONIX) 40 MG tablet Take 1 tablet (40 mg total) by mouth daily before breakfast. 90 tablet 0   tamsulosin (FLOMAX) 0.4 MG CAPS capsule Take 1 capsule (0.4 mg total) by mouth daily after supper. 90 capsule 1   zolpidem (AMBIEN) 10 MG tablet Take 10 mg by mouth at bedtime as needed for sleep.     No facility-administered medications prior to visit.     Review of Systems:   Constitutional: No weight gain or loss, night sweats, fevers, chills, fatigue, lassitude HEENT: No headaches, difficulty swallowing, tooth/dental problems, or sore throat. No sneezing, itching, ear ache, nasal congestion, or post nasal drip CV:  No chest pain, orthopnea, PND, swelling in lower extremities, anasarca, dizziness, palpitations, syncope Resp: +shortness of breath with exertion (baseline). No cough. No wheezing. No hemoptysis.  No chest wall deformity GI:  No heartburn, indigestion, abdominal pain, nausea, vomiting, diarrhea, change in bowel habits, loss of appetite, bloody stools.  MSK:  No joint pain or swelling.  No decreased range of motion.  No back pain. Neuro: No dizziness  or lightheadedness.  Psych: No depression or anxiety. Mood stable.     Physical Exam:  BP 132/80   Pulse 91   Ht 5\' 9"  (1.753 m)   Wt 164 lb 12.8 oz (74.8 kg)   SpO2 93%   BMI 24.34 kg/m   GEN: Pleasant, interactive, chronically-ill appearing; in no acute distress. HEENT:  Normocephalic and atraumatic. PERRLA. Sclera white. Nasal turbinates pink, moist and patent bilaterally. No rhinorrhea present. Oropharynx pink and moist, without exudate or edema. No lesions, ulcerations, or postnasal drip.  NECK:  Supple w/ fair ROM. No JVD present. Normal carotid impulses w/o bruits. Thyroid symmetrical with no goiter or nodules palpated. No lymphadenopathy.   CV: RRR, no m/r/g, no peripheral edema. Pulses intact, +2 bilaterally. No cyanosis, pallor or clubbing. PULMONARY:  Unlabored, regular breathing. Diminished posterior otherwise clear w/o wheezes/rales/rhonchi. No accessory muscle use.  GI: BS present and normoactive. Soft, non-tender to palpation. No organomegaly or masses detected.  MSK: No erythema, warmth or tenderness. Cap refil <2 sec all extrem. No deformities or joint swelling noted.  Neuro: A/Ox3. No focal deficits noted.   Skin: Warm, no lesions or rashe Psych: Normal affect and behavior. Judgement and thought content appropriate.     Lab Results:  CBC    Component Value Date/Time   WBC 11.1 (H) 09/06/2022 0836   RBC 4.36 09/06/2022 0836   HGB 13.7 09/06/2022 0836   HCT 40.5 09/06/2022 0836   PLT 412.0 (H) 09/06/2022 0836   MCV 92.8 09/06/2022 0836   MCH 30.7 08/28/2022 1017   MCHC 33.7 09/06/2022 0836   RDW 15.4 09/06/2022 0836   LYMPHSABS 2.1 09/06/2022 0836   MONOABS 1.1 (H) 09/06/2022 0836   EOSABS 0.0 09/06/2022 0836   BASOSABS 0.0 09/06/2022 0836    BMET    Component Value  Date/Time   NA 134 (L) 09/06/2022 0836   NA 144 12/23/2021 1012   K 4.8 09/06/2022 0836   CL 95 (L) 09/06/2022 0836   CO2 33 (H) 09/06/2022 0836   GLUCOSE 191 (H) 09/06/2022 0836    GLUCOSE 118 (H) 10/18/2006 0000   BUN 10 09/06/2022 0836   BUN 11 12/23/2021 1012   CREATININE 0.84 09/06/2022 0836   CREATININE 0.93 07/28/2020 1026   CALCIUM 8.7 09/06/2022 0836   GFRNONAA >60 08/28/2022 1017   GFRAA >60 09/18/2019 0353    BNP    Component Value Date/Time   BNP 283.3 (H) 08/27/2022 0515     Imaging:  DG Hand Complete Left  Result Date: 09/07/2022 CLINICAL DATA:  Follow up 3rd metacarpal fracture. EXAM: LEFT HAND - COMPLETE 3+ VIEW COMPARISON:  Radiographs 07/31/2022 FINDINGS: The bones are demineralized. Oblique fracture of the 3rd metacarpal shaft demonstrates 3 mm of posterior displacement (previously 4 mm). No definite fracture healing is seen, although there is some callus formation surrounding the fracture which likely extends into the surrounding soft tissues. No new fractures are identified. There is no dislocation. Mild underlying degenerative changes are stable. IMPRESSION: Slightly decreased posterior displacement of the 3rd metacarpal shaft fracture with interval surrounding callus formation. No definite fracture healing identified. Electronically Signed   By: Carey Bullocks M.D.   On: 09/07/2022 08:35   CUP PACEART REMOTE DEVICE CHECK  Result Date: 09/01/2022 Scheduled remote reviewed. Normal device function.  5 NSVT, 5-18 beats Next remote 91 days. LA  DG CHEST PORT 1 VIEW  Result Date: 08/30/2022 CLINICAL DATA:  81 year old male with chest pain. EXAM: PORTABLE CHEST 1 VIEW COMPARISON:  Portable chest 08/27/2022 and earlier. FINDINGS: Portable AP semi upright view at 1003 hours. Stable left chest dual lead cardiac pacemaker. Lower lung volumes and increased, platelike left lingula or lung base opacity from 3 days ago. Similar lung volumes. No pneumothorax or pulmonary edema. No pleural effusion or other confluent opacity identified. Stable visualized osseous structures.  Negative visible bowel gas. IMPRESSION: Lower lung volumes with increased left  lung plate-like opacity from 3 days ago. This is nonspecific but could be atelectasis. No pulmonary edema or pleural effusion identified. Electronically Signed   By: Odessa Fleming M.D.   On: 08/30/2022 10:29   DG Chest Port 1 View  Result Date: 08/27/2022 CLINICAL DATA:  Hypoxia, shortness of breath and chills. EXAM: PORTABLE CHEST 1 VIEW COMPARISON:  PA and lateral 08/23/2022 FINDINGS: 5:17 a.m. Left chest dual lead pacing system and wire insertions are unaltered. The aorta is tortuous and ectatic with calcification of the transverse segment and stable mediastinum. Cardiomegaly appears similar. There is mild central vascular prominence without overt edema. Today there is worsening patchy consolidation in the left lower lung field and the right infrahilar area, most likely due to pneumonia or aspiration. The remainder of the lungs remain clear. No pleural effusion is seen. In all other respects no further changes. 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 IMPRESSION: 1. Worsening patchy consolidation in the left lower lung field and the right infrahilar area, most likely due to pneumonia or aspiration. Follow-up study recommended to ensure clearing. 2. Cardiomegaly and mild central vascular prominence without overt edema. 3. Aortic atherosclerosis. Electronically Signed   By: Almira Bar M.D.   On: 08/27/2022 05:31   DG Chest 2 View  Result Date: 08/23/2022 CLINICAL DATA:  81 year old male with shortness of breath and frequent community-acquired pneumonia is. EXAM: CHEST -  2 VIEW COMPARISON:  Chest radiographs 07/30/2022 and earlier. FINDINGS: PA and lateral views at 0533 hours. Stable left chest dual lead cardiac pacemaker. Stable lung volumes and mediastinal contours. Visualized tracheal air column is within normal limits. No pneumothorax, pulmonary edema or definite pleural effusion. Chronic platelike atelectasis or scarring in the lingula or right middle lobe does  not appear significantly changed since February radiographs (lateral). And no acute or confluent pulmonary opacity is identified. Stable visualized osseous structures.  Negative visible bowel gas. IMPRESSION: Stable chronic lung disease. No acute cardiopulmonary abnormality identified. Electronically Signed   By: Odessa Fleming M.D.   On: 08/23/2022 05:47         Latest Ref Rng & Units 09/18/2020    8:34 AM 10/18/2014    1:02 PM  PFT Results  FVC-Pre L 1.83  2.49  P  FVC-Predicted Pre % 49  63  P  FVC-Post L 1.89  2.32  P  FVC-Predicted Post % 50  58  P  Pre FEV1/FVC % % 73  68  P  Post FEV1/FCV % % 75  72  P  FEV1-Pre L 1.33  1.71  P  FEV1-Predicted Pre % 50  59  P  FEV1-Post L 1.41  1.68  P  DLCO uncorrected ml/min/mmHg 11.58  15.84  P  DLCO UNC% % 50  53  P  DLCO corrected ml/min/mmHg 11.58    DLCO COR %Predicted % 50    DLVA Predicted % 119  85  P  TLC L 4.82  5.03  P  TLC % Predicted % 72  75  P  RV % Predicted % 117  107  P    P Preliminary result    No results found for: "NITRICOXIDE"      Assessment & Plan:   COPD, moderate (HCC) Resolved AECOPD related to CAP secondary to COVID infection. Clinically improved. Doing quite well today. He is maintained on triple therapy regimen with duoneb and budesonide.   Patient Instructions  Continue ipratropium-albuterol 3 mL neb every 6 hours Continue budesonide 2 mL neb twice daily.  Brush tongue and rinse mouth afterwards Continue supplemental oxygen 4 L/min for goal greater than 88 to 90% Continue Mucinex 600 to 1200 mg twice daily Continue pantoprazole 1 tab daily   Attend high resolution CT chest first week in January - someone will contact you for scheduling. This will also be 6 weeks after you completed antibiotics for your pneumonia    Follow up with [name] in January to review CT scan results. If symptoms do not improve or worsen, please contact office for sooner follow up or seek emergency care.    ILD (interstitial  lung disease) (HCC) Questionable ILD on past imaging. HRCT is scheduled for 12/27. Given his recent pneumonia, we will push this one week to ensure imaging is 6 weeks after completing abx therapy.   Chronic respiratory failure with hypoxia and hypercapnia (HCC) Stable without any increased O2 requirement. Goal >88-90%  Chronic diastolic heart failure (HCC) Appears euvolemic on exam. Weight is down to 164 lb today. Encouraged him to monitor his weights and notify his cardiologist if he gains 2-3 lb overnight or 5 lb in a week. Follow up with cardiology as scheduled.     I spent 35 minutes of dedicated to the care of this patient on the date of this encounter to include pre-visit review of records, face-to-face time with the patient discussing conditions above, post visit ordering of testing, clinical  documentation with the electronic health record, making appropriate referrals as documented, and communicating necessary findings to members of the patients care team.  Noemi Chapel, NP 09/09/2022  Pt aware and understands NP's role.

## 2022-09-09 NOTE — Patient Instructions (Addendum)
Continue ipratropium-albuterol 3 mL neb every 6 hours Continue budesonide 2 mL neb twice daily.  Brush tongue and rinse mouth afterwards Continue supplemental oxygen 4 L/min for goal greater than 88 to 90% Continue Mucinex 600 to 1200 mg twice daily Continue pantoprazole 1 tab daily   Attend high resolution CT chest first week in January - someone will contact you for scheduling. This will also be 6 weeks after you completed antibiotics for your pneumonia    Follow up with [name] in January to review CT scan results. If symptoms do not improve or worsen, please contact office for sooner follow up or seek emergency care.

## 2022-09-09 NOTE — Assessment & Plan Note (Signed)
Resolved AECOPD related to CAP secondary to COVID infection. Clinically improved. Doing quite well today. He is maintained on triple therapy regimen with duoneb and budesonide.   Patient Instructions  Continue ipratropium-albuterol 3 mL neb every 6 hours Continue budesonide 2 mL neb twice daily.  Brush tongue and rinse mouth afterwards Continue supplemental oxygen 4 L/min for goal greater than 88 to 90% Continue Mucinex 600 to 1200 mg twice daily Continue pantoprazole 1 tab daily   Attend high resolution CT chest first week in January - someone will contact you for scheduling. This will also be 6 weeks after you completed antibiotics for your pneumonia    Follow up with [name] in January to review CT scan results. If symptoms do not improve or worsen, please contact office for sooner follow up or seek emergency care.

## 2022-09-09 NOTE — Assessment & Plan Note (Signed)
Questionable ILD on past imaging. HRCT is scheduled for 12/27. Given his recent pneumonia, we will push this one week to ensure imaging is 6 weeks after completing abx therapy.

## 2022-09-09 NOTE — Assessment & Plan Note (Signed)
Stable without any increased O2 requirement. Goal >88-90%

## 2022-09-10 ENCOUNTER — Other Ambulatory Visit (HOSPITAL_BASED_OUTPATIENT_CLINIC_OR_DEPARTMENT_OTHER): Payer: Self-pay

## 2022-09-13 ENCOUNTER — Encounter (HOSPITAL_BASED_OUTPATIENT_CLINIC_OR_DEPARTMENT_OTHER): Payer: Self-pay | Admitting: Cardiovascular Disease

## 2022-09-13 ENCOUNTER — Ambulatory Visit (INDEPENDENT_AMBULATORY_CARE_PROVIDER_SITE_OTHER): Payer: Medicare Other | Admitting: Cardiovascular Disease

## 2022-09-13 VITALS — BP 120/62 | HR 80 | Ht 69.0 in | Wt 180.3 lb

## 2022-09-13 DIAGNOSIS — I2781 Cor pulmonale (chronic): Secondary | ICD-10-CM

## 2022-09-13 DIAGNOSIS — I5032 Chronic diastolic (congestive) heart failure: Secondary | ICD-10-CM

## 2022-09-13 DIAGNOSIS — I739 Peripheral vascular disease, unspecified: Secondary | ICD-10-CM

## 2022-09-13 DIAGNOSIS — I10 Essential (primary) hypertension: Secondary | ICD-10-CM | POA: Diagnosis not present

## 2022-09-13 DIAGNOSIS — J189 Pneumonia, unspecified organism: Secondary | ICD-10-CM

## 2022-09-13 NOTE — Assessment & Plan Note (Signed)
No claudication.  Encouraged him to keep up his exercise and walk frequently.  Continue aspirin, statin, and bisoprolol.  LDL is at goal.

## 2022-09-13 NOTE — Progress Notes (Deleted)
Cardiology Office Note  Date:  09/13/2022   ID:  Bradley Searles Sr., DOB [birth year], MRN [id number]  PCP:  [name], MD  Cardiologist:   Bradley Si, MD   No chief complaint on file.    History of Present Illness: Bradley ARMIJO Sr. is a 81 y.o. male with chronic atrial fibrillation s/p AVN ablation and CRT-P, chronic diastolic heart failure, mild aortic stenosis, CAD, hypertension, hyperlipidemia, prior ICH (no anticoagulation), COPD, and PAD who presents for follow-up.  He previously underwent left heart catheterization in 2005 and in 2018 and had no significant coronary artery disease. He had a BiV pacemaker and AV nodal ablation 09/08/11. At that time he had reduced systolic function in the setting of atrial fibrillation with RVR. After placement of the biventricular pacemaker his ejection fraction improved to 50-55% in 2017.  He was admitted 02/2017 with COPD exacerbation and acute on chronic diastolic heart failure. BNP was 491 and TEE had pleural effusions on chest x-ray. He improved with diuresis. Echocardiogram at that time revealed LVEF 55-60%, mild mitral regurgitation, and PA SP 55 mmHg.  He followed up with [name] on 03/09/17 and was doing well.  Bradley [name] was admitted 12/2018 with a COPD exacerbation  He developed increasing shortness of breath and cough.  In the hospital he required BiPAP, IV steroids and antibiotics.  He was again admitted 04/2019 with acute respiratory failure due to both COPD exacerbation and acute on chronic diastolic heart failure.  He followed up with [name], PA-C on 05/2019 and his weight was stable at 206 pounds.    Bradley [name] was admitted 09/2019 with a COPD exacerbation requiring antibiotics and steroids.  He had an echo 08/2020 that revealed LVEF 55 to 60% with mild LVH.  Right ventricular function was normal.  PASP was 50.6 mmHg.  He was then admitted 03/2021 with COPD exacerbation and acute on chronic diastolic heart failure.  Since discharge he  had been feeling well.    On 07/29/2021 he was admitted with acute on chronic respiratory failure with hypoxia, acute on chronic systolic CHF, and COPD with acute exacerbation. CXR showed evidence of patchy bilateral infiltrates possibly indicating pulmonary edema. His weight had increased approximately 12 kg over the month prior. He was again admitted 08/29/2021 with acute on chronic hypoxic respiratory failure. While in the hospital he was seen by Heart Failure. He was treated with steroids and IV Lasix. He followed up with Heart Failure on 11/22 and was doing well. Bradley [name] was added to his regimen.  He was feeling well at his last visit 09/2021.  He did not appear to be volume overloaded so Lasix was increased to 80 mg in the morning and 40 in the evening.  He had repeat labs 09/2021 that revealed stable renal function and BNP was 193.    Bradley [name] was switched from losartan to [facility].  At the last visit he was feeling well and his breathing was better controlled.  He is using his oxygen less frequently.  He saw Dr. Ladona Ridgel 07/2022 and his device was stable.   Past Medical History:  Diagnosis Date   Abnormal CT scan, chest 2012   CT chest, several lymphadenopathies. Sees pulmonary   Anemia    intermittent   Arthritis    "?back" (09/19/2018)   Atrial fibrillation (HCC)    s/p AV node ablation & BiV PPM implantation 09/08/11 (op dictation pending)   BPH (benign prostatic hyperplasia)    Saw  Dr Wanda [name] 2004, normal renal u/s   Bronchitis 08/26/2017   CAD (coronary artery disease)    CHF (congestive heart failure) (HCC)    Thought primarily to be non-systolic although EF down (EF 88-11% 12/2010, down to 35-40% 09/05/11), cath 2008 with no CAD, nuclear study 07/2011 showing Small area of reversibility in the distal ant/lat wall the left ventricle suspicious for ischemia/septal wall HK but felt to be low risk  (per D/C Summary 07/2011)   Chronic bronchitis (HCC)    "get it ~ q yr"   Chronic  diastolic heart failure (HCC)         Chronic respiratory failure with hypoxia and hypercapnia (HCC) 09/14/2010   Followed in Pulmonary clinic/ [facility]/ [facility]  Started on 02 2lpm at discharge 09/23/11   - PFT's 10/28/2011  FEV1  1.40 (51%)  with ratio 70 and no better p B2 and DLCO 53 corrects to 101    - PFT's 10/18/2014    FEV1 1.71 (59%) with ratio 68 and no sign change p B2 and DLCO 53 corrects to 85  -  HC03   07/28/20  = 33  -  HCO3   03/17/21     = 31     Heart murmur    HLD (hyperlipidemia)    HTN (hypertension)    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   Pleural effusion 2008   S/p decortication   Pneumonia 08/02/2011   Pulmonary HTN (HCC)    per cath 2008   Type II diabetes mellitus (HCC) 1999    Past Surgical History:  Procedure Laterality Date   BI-VENTRICULAR PACEMAKER INSERTION Left 09/08/2011   Procedure: BI-VENTRICULAR PACEMAKER INSERTION (CRT-P);  Surgeon: Bradley Maw, MD;  Location: [facility] CATH LAB;  Service: Cardiovascular;  Laterality: Left;   CARDIAC CATHETERIZATION     "couple times; never had balloon or stent" (09/19/2018)   CATARACT EXTRACTION W/ INTRAOCULAR LENS  IMPLANT, BILATERAL Bilateral 08/2018   COLONOSCOPY  03/10/11   normal   INSERT / REPLACE / REMOVE PACEMAKER  09/08/11   pacemaker placement   LUNG DECORTICATION     PLEURAL SCARIFICATION     pneumothorax with fibrothorax  ~ 2010   TONSILLECTOMY     "as a kid"      Current Outpatient Medications  Medication Sig Dispense Refill   acetaminophen (TYLENOL) 500 MG tablet Take 1,000 mg by mouth every 8 (eight) hours as needed for headache or moderate pain.     albuterol (VENTOLIN HFA) 108 (90 Base) MCG/ACT inhaler Inhale 2 puffs into the lungs every 6 (six) hours as needed for wheezing or shortness of breath.  (Patient taking differently: Inhale 1 puff into the lungs 2 (two) times daily.) 8.5 g 0   amLODipine (NORVASC) 10 MG tablet Take 1 tablet (10 mg total) by mouth daily. (Patient taking differently: Take 10 mg by mouth in the morning.) 90 tablet 1   aspirin 81 MG tablet Take 81 mg by mouth every morning.      atorvastatin (LIPITOR) 20 MG tablet Take 1 tablet (20 mg total) by mouth at bedtime. (Patient taking differently: Take 20 mg by mouth every evening.) 90 tablet 1   bisoprolol (ZEBETA) 10 MG tablet Take 1 tablet (10  mg total) by mouth daily. 90 tablet 0   budesonide (PULMICORT) 0.5 MG/2ML nebulizer solution Take 2 mLs (0.5 mg total) by nebulization 2 (two) times daily. 360 mL 1   calcium-vitamin D (OSCAL WITH D) 500-200 MG-UNIT tablet Take 1 tablet by mouth every morning.     cholecalciferol (VITAMIN D3) 25 MCG (1000 UNIT) tablet Take 1,000 Units by mouth every evening.     diphenhydramine-acetaminophen (TYLENOL PM) 25-500 MG TABS tablet Take 1 tablet by mouth at bedtime as needed (sleep).     FARXIGA 10 MG TABS tablet TAKE 1 TABLET DAILY BEFORE BREAKFAST (Patient taking differently: Take 10 mg by mouth in the morning.) 90 tablet 3   furosemide (LASIX) 40 MG tablet TAKE 2 TABLETS EVERY MORNING AND 1 TABLET LATE IN THE AFTERNOON (Patient taking differently: Take 40 mg by mouth in the morning.) 270 tablet 3   guaiFENesin (MUCINEX) 600 MG 12 hr tablet Take 600 mg by mouth 2 (two) times daily.     ipratropium-albuterol (DUONEB) 0.5-2.5 (3) MG/3ML SOLN Take 3 mLs by nebulization every 6 (six) hours as needed. (Patient taking differently: Take 3 mLs by nebulization in the morning and at bedtime.) 360 mL 1   Lancets (ONETOUCH ULTRASOFT) lancets Check blood sugars no more than twice daily 200 each 12   Multiple Vitamins-Minerals (MULTIVITAMINS THER. W/MINERALS) TABS tablet Take 1 tablet by mouth daily. (Patient taking differently: Take 1 tablet by mouth in the morning.) 30 each 5   nitroGLYCERIN (NITROSTAT)  0.4 MG SL tablet Place 1 tablet (0.4 mg total) under the tongue every 5 (five) minutes x 3 doses as needed for chest pain. 25 tablet 3   ONETOUCH VERIO test strip USE TO CHECK BLOOD SUGAR NO MORE THAN TWICE A DAY 200 strip 12   pantoprazole (PROTONIX) 40 MG tablet Take 1 tablet (40 mg total) by mouth daily before breakfast. 90 tablet 0   tamsulosin (FLOMAX) 0.4 MG CAPS capsule Take 1 capsule (0.4 mg total) by mouth daily after supper. 90 capsule 1   zolpidem (AMBIEN) 10 MG tablet Take 10 mg by mouth at bedtime as needed for sleep.     No current facility-administered medications for this visit.    Allergies:   Hydrocodone, Ultram [tramadol], Cialis [tadalafil], and Avelox [moxifloxacin hydrochloride]    Social History:  The patient  reports that he quit smoking about 41 years ago. His smoking use included cigarettes and cigars. He has a 62.50 pack-year smoking history. He has never used smokeless tobacco. He reports that he does not currently use alcohol. He reports that he does not use drugs.   Family History:  The patient's family history includes Breast cancer in his maternal aunt; Coronary artery disease in his father; Diabetes in his father; Drug abuse in his son; Polycythemia in his mother.    ROS:   Please see the history of present illness. All other systems are reviewed and negative.    PHYSICAL EXAM: VS:  There were no vitals taken for this visit. , BMI There is no height or weight on file to calculate BMI. GENERAL:  Well appearing HEENT: Pupils equal round and reactive, fundi not visualized, oral mucosa unremarkable.  Edentulous.   NECK:  No jugular venous distention, waveform within normal limits, carotid upstroke brisk and symmetric, no bruits LUNGS:  Diminished at the bases.  Poor air movement.  HEART:  Irregularly irregular.  PMI not displaced or sustained,S1 and S2 within normal limits, no S3, no S4, no clicks, no  rubs, no murmurs ABD:  Flat, positive bowel sounds normal  in frequency in pitch, no bruits, no rebound, no guarding, no midline pulsatile mass, no hepatomegaly, no splenomegaly EXT:  2 plus pulses throughout, Trace bilateral ankle edema, no cyanosis no clubbing SKIN:  No rashes no nodules NEURO:  Cranial nerves II through XII grossly intact, motor grossly intact throughout PSYCH:  Cognitively intact, oriented to person place and time   EKG:   03/09/22: BiV pace.  Rate 88 bpm 09/17/2021: EKG was not ordered. 06/03/2021: Atrial fibrillation.  Ventricular paced 73 bpm. 11/28/20: VP.  Rate 82 bpm.   08/26/17: VP.  Rate 82 bpm.   05/26/17: atrial fibrillation.  Ventricular paced at 75 bpm  Echo 05/26/18: Study Conclusions   - Left ventricle: The cavity size was normal. Wall thickness was   normal. Systolic function was normal. The estimated ejection   fraction was in the range of 55% to 60%. Although no diagnostic   regional wall motion abnormality was identified, this possibility   cannot be completely excluded on the basis of this study. The   study was not technically sufficient to allow evaluation of LV   diastolic dysfunction due to atrial fibrillation. - Aortic valve: There was no stenosis. - Mitral valve: Moderately calcified annulus. There was trivial   regurgitation. - Left atrium: The atrium was mildly dilated. - Right ventricle: The cavity size was moderately dilated. Pacer   wire or catheter noted in right ventricle. Systolic function was   normal. - Right atrium: The atrium was severely dilated. - Tricuspid valve: Peak RV-RA gradient (S): 32 mm Hg. - Pulmonary arteries: PA peak pressure: 40 mm Hg (S). - Systemic veins: IVC measured 2.2 cm with > 50% respirophasic   variation, suggesting RA pressure 8 mmHg.   Impressions:   - The patient appeared to be in atrial fibrillation. Normal LV size   and systolic function, EF 55-60%. Moderately dilated RV with   normal systolic function. Mild pulmonary hypertension. Biatrial    enlargement. No significant valvular abnormalities.  Recent Labs: 07/13/2022: TSH 0.95 08/27/2022: B Natriuretic Peptide 283.3; Magnesium 2.2 08/28/2022: ALT 18 09/06/2022: BUN 10; Creatinine, Ser 0.84; Hemoglobin 13.7; Platelets 412.0; Potassium 4.8; Sodium 134    Lipid Panel    Component Value Date/Time   CHOL 141 11/11/2021 0928   TRIG 148.0 11/11/2021 0928   HDL 41.10 11/11/2021 0928   CHOLHDL 3 11/11/2021 0928   VLDL 29.6 11/11/2021 0928   LDLCALC 70 11/11/2021 0928    Wt Readings from Last 3 Encounters:  09/09/22 164 lb 12.8 oz (74.8 kg)  09/07/22 165 lb (74.8 kg)  09/06/22 181 lb 4 oz (82.2 kg)     ASSESSMENT AND PLAN:  No problem-specific Assessment & Plan notes found for this encounter.      Current medicines are reviewed at length with the patient today.  The patient does not have concerns regarding medicines.  The following changes have been made: Switch losartan to [facility]  Labs/ tests ordered today include:   No orders of the defined types were placed in this encounter.    Disposition:   FU with Tymarion Everard C. Duke Salvia, MD, [facility] in 3 months.    Signed, Pierrette Scheu C. Duke Salvia, MD, [facility]  09/13/2022 8:11 AM    [facility]

## 2022-09-13 NOTE — Assessment & Plan Note (Signed)
Multiple hospitalizations for rhinovirus and COVID-19.  Fortunately his breathing is back at baseline.  Continue supplemental oxygen.  Carvedilol switched to bisoprolol.

## 2022-09-13 NOTE — Progress Notes (Signed)
Cardiology Office Note  Date:  09/13/2022   ID:  Bradley Searles Sr., DOB 1940-10-24, MRN [id number]  PCP:  Bradley Plump, MD  Cardiologist:  Bradley Si, MD  No chief complaint on file.    History of Present Illness: Bradley LATON Sr. is a 81 y.o. male with chronic atrial fibrillation s/p AVN ablation and CRT-P, chronic diastolic heart failure, mild aortic stenosis, CAD, hypertension, hyperlipidemia, prior ICH (no anticoagulation), COPD, and PAD who presents for follow-up.  He previously underwent left heart catheterization in 2005 and in 2018 and had no significant coronary artery disease. He had a BiV pacemaker and AV nodal ablation 09/08/11. At that time he had reduced systolic function in the setting of atrial fibrillation with RVR. After placement of the biventricular pacemaker his ejection fraction improved to 50-55% in 2017.  He was admitted 02/2017 with COPD exacerbation and acute on chronic diastolic heart failure. BNP was 491 and TEE had pleural effusions on chest x-ray. He improved with diuresis. Echocardiogram at that time revealed LVEF 55-60%, mild mitral regurgitation, and PA SP 55 mmHg.  He followed up with Bradley [name] on 03/09/17 and was doing well.  Bradley [name] was admitted 12/2018 with a COPD exacerbation  He developed increasing shortness of breath and cough.  In the hospital he required BiPAP, IV steroids and antibiotics.  He was again admitted 04/2019 with acute respiratory failure due to both COPD exacerbation and acute on chronic diastolic heart failure.  He followed up with Bradley Shelter, PA-C on 05/2019 and his weight was stable at 206 pounds.    Bradley [name] was admitted 09/2019 with a COPD exacerbation requiring antibiotics and steroids.  He had an echo 08/2020 that revealed LVEF 55 to 60% with mild LVH.  Right ventricular function was normal.  PASP was 50.6 mmHg.  He was then admitted 03/2021 with COPD exacerbation and acute on chronic diastolic heart failure.  Since discharge he  had been feeling well.    On 07/29/2021 he was admitted with acute on chronic respiratory failure with hypoxia, acute on chronic systolic CHF, and COPD with acute exacerbation. CXR showed evidence of patchy bilateral infiltrates possibly indicating pulmonary edema. His weight had increased approximately 12 kg over the month prior. He was again admitted 08/29/2021 with acute on chronic hypoxic respiratory failure. While in the hospital he was seen by Heart Failure. He was treated with steroids and IV Lasix. He followed up with Heart Failure on 11/22 and was doing well. Bradley [name] was added to his regimen.  He was feeling well at his last visit 09/2021.  He did not appear to be volume overloaded so Lasix was increased to 80 mg in the morning and 40 in the evening.  He had repeat labs 09/2021 that revealed stable renal function and BNP was 193.    Bradley [name] was switched from losartan to [facility].  At the last visit he was feeling well and his breathing was better controlled.  He is using his oxygen less frequently.  He saw Bradley [name] 07/2022 and his device was stable.  He has been doing well overall. He endorsed that he was recently put off the Carvedilol and was switched to Bisoprolol at his ED visit.Marland Kitchen He has been continuing to walk for his physical activity. He denies any chest pain, shortness of breath, or LE edema. He also denied any pain on his lower extremities. He noted that his device is almost due for a battery change.  Past  Medical History:  Diagnosis Date   Abnormal CT scan, chest 2012   CT chest, several lymphadenopathies. Sees pulmonary   Anemia    intermittent   Arthritis    "?back" (09/19/2018)   Atrial fibrillation (HCC)    s/p AV node ablation & BiV PPM implantation 09/08/11 (op dictation pending)   BPH (benign prostatic hyperplasia)    Saw Dr Bradley [name] 2004, normal renal u/s   Bronchitis 08/26/2017   CAD (coronary artery disease)    CHF (congestive heart failure) (HCC)    Thought  primarily to be non-systolic although EF down (EF 01-65% 12/2010, down to 35-40% 09/05/11), cath 2008 with no CAD, nuclear study 07/2011 showing Small area of reversibility in the distal ant/lat wall the left ventricle suspicious for ischemia/septal wall HK but felt to be low risk  (per D/C Summary 07/2011)   Chronic bronchitis (HCC)    "get it ~ q yr"   Chronic diastolic heart failure (HCC)         Chronic respiratory failure with hypoxia and hypercapnia (HCC) 09/14/2010   Followed in Pulmonary clinic/ [facility]/ [facility]  Started on 02 2lpm at discharge 09/23/11   - PFT's 10/28/2011  FEV1  1.40 (51%)  with ratio 70 and no better p B2 and DLCO 53 corrects to 101    - PFT's 10/18/2014    FEV1 1.71 (59%) with ratio 68 and no sign change p B2 and DLCO 53 corrects to 85  -  HC03   07/28/20  = 33  -  HCO3   03/17/21     = 31     Heart murmur    HLD (hyperlipidemia)    HTN (hypertension)    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   Pleural effusion 2008   S/p decortication   Pneumonia 08/02/2011   Pulmonary HTN (HCC)    per cath 2008   Type II diabetes mellitus (HCC) 1999    Past Surgical History:  Procedure Laterality Date   BI-VENTRICULAR PACEMAKER INSERTION Left 09/08/2011   Procedure: BI-VENTRICULAR PACEMAKER INSERTION (CRT-P);  Surgeon: Bradley Maw, MD;  Location: [facility] CATH LAB;  Service: Cardiovascular;  Laterality: Left;   CARDIAC CATHETERIZATION     "couple times; never had balloon or stent" (09/19/2018)   CATARACT EXTRACTION W/ INTRAOCULAR LENS  IMPLANT, BILATERAL Bilateral 08/2018   COLONOSCOPY  03/10/11   normal   INSERT / REPLACE / REMOVE PACEMAKER  09/08/11   pacemaker placement   LUNG DECORTICATION     PLEURAL SCARIFICATION     pneumothorax with fibrothorax  ~ 2010   TONSILLECTOMY     "as a  kid"      Current Outpatient Medications  Medication Sig Dispense Refill   acetaminophen (TYLENOL) 500 MG tablet Take 1,000 mg by mouth every 8 (eight) hours as needed for headache or moderate pain.     albuterol (VENTOLIN HFA) 108 (90 Base) MCG/ACT inhaler Inhale 2 puffs into the lungs every 6 (six) hours as needed for wheezing or shortness of breath. (Patient taking differently: Inhale 1 puff into the lungs 2 (two) times daily.) 8.5 g 0   amLODipine (NORVASC) 10 MG tablet Take 1 tablet (10 mg total) by mouth daily. (Patient taking differently: Take 10 mg  by mouth in the morning.) 90 tablet 1   aspirin 81 MG tablet Take 81 mg by mouth every morning.      atorvastatin (LIPITOR) 20 MG tablet Take 1 tablet (20 mg total) by mouth at bedtime. (Patient taking differently: Take 20 mg by mouth every evening.) 90 tablet 1   bisoprolol (ZEBETA) 10 MG tablet Take 1 tablet (10 mg total) by mouth daily. 90 tablet 0   budesonide (PULMICORT) 0.5 MG/2ML nebulizer solution Take 2 mLs (0.5 mg total) by nebulization 2 (two) times daily. 360 mL 1   calcium-vitamin D (OSCAL WITH D) 500-200 MG-UNIT tablet Take 1 tablet by mouth every morning.     cholecalciferol (VITAMIN D3) 25 MCG (1000 UNIT) tablet Take 1,000 Units by mouth every evening.     diphenhydramine-acetaminophen (TYLENOL PM) 25-500 MG TABS tablet Take 1 tablet by mouth at bedtime as needed (sleep).     FARXIGA 10 MG TABS tablet TAKE 1 TABLET DAILY BEFORE BREAKFAST (Patient taking differently: Take 10 mg by mouth in the morning.) 90 tablet 3   furosemide (LASIX) 40 MG tablet TAKE 2 TABLETS EVERY MORNING AND 1 TABLET LATE IN THE AFTERNOON (Patient taking differently: Take 40 mg by mouth in the morning.) 270 tablet 3   guaiFENesin (MUCINEX) 600 MG 12 hr tablet Take 600 mg by mouth 2 (two) times daily.     ipratropium-albuterol (DUONEB) 0.5-2.5 (3) MG/3ML SOLN Take 3 mLs by nebulization every 6 (six) hours as needed. (Patient taking differently: Take 3 mLs by  nebulization in the morning and at bedtime.) 360 mL 1   Lancets (ONETOUCH ULTRASOFT) lancets Check blood sugars no more than twice daily 200 each 12   Multiple Vitamins-Minerals (MULTIVITAMINS THER. W/MINERALS) TABS tablet Take 1 tablet by mouth daily. (Patient taking differently: Take 1 tablet by mouth in the morning.) 30 each 5   nitroGLYCERIN (NITROSTAT) 0.4 MG SL tablet Place 1 tablet (0.4 mg total) under the tongue every 5 (five) minutes x 3 doses as needed for chest pain. 25 tablet 3   ONETOUCH VERIO test strip USE TO CHECK BLOOD SUGAR NO MORE THAN TWICE A DAY 200 strip 12   pantoprazole (PROTONIX) 40 MG tablet Take 1 tablet (40 mg total) by mouth daily before breakfast. 90 tablet 0   tamsulosin (FLOMAX) 0.4 MG CAPS capsule Take 1 capsule (0.4 mg total) by mouth daily after supper. 90 capsule 1   zolpidem (AMBIEN) 10 MG tablet Take 10 mg by mouth at bedtime as needed for sleep.     No current facility-administered medications for this visit.    Allergies:   Hydrocodone, Ultram [tramadol], Cialis [tadalafil], and Avelox [moxifloxacin hydrochloride]    Social History:  The patient  reports that he quit smoking about 41 years ago. His smoking use included cigarettes and cigars. He has a 62.50 pack-year smoking history. He has never used smokeless tobacco. He reports that he does not currently use alcohol. He reports that he does not use drugs.   Family History:  The patient's family history includes Breast cancer in his maternal aunt; Coronary artery disease in his father; Diabetes in his father; Drug abuse in his son; Polycythemia in his mother.    ROS:   Please see the history of present illness. All other systems are reviewed and negative.    PHYSICAL EXAM: VS:  BP 120/62 (BP Location: Left Arm, Patient Position: Sitting, Cuff Size: Normal)   Pulse 80   Ht 5\' 9"  (1.753 m)   Wt  180 lb 4.8 oz (81.8 kg)   BMI 26.63 kg/m  , BMI Body mass index is 26.63 kg/m. GENERAL:  Well  appearing HEENT: Pupils equal round and reactive, fundi not visualized, oral mucosa unremarkable.  Edentulous.   NECK:  No jugular venous distention, waveform within normal limits, carotid upstroke brisk and symmetric, no bruits LUNGS:  Diminished at the bases.  Poor air movement.  HEART:  Irregularly irregular.  PMI not displaced or sustained,S1 and S2 within normal limits, no S3, no S4, no clicks, no rubs, no murmurs ABD:  Flat, positive bowel sounds normal in frequency in pitch, no bruits, no rebound, no guarding, no midline pulsatile mass, no hepatomegaly, no splenomegaly EXT:  2 plus pulses throughout, Trace bilateral ankle edema, no cyanosis no clubbing SKIN:  No rashes no nodules NEURO:  Cranial nerves II through XII grossly intact, motor grossly intact throughout PSYCH:  Cognitively intact, oriented to person place and time   EKG:  09/13/22: EKG was not ordered. 03/09/22: BiV pace.  Rate 88 bpm 09/17/2021: EKG was not ordered. 06/03/2021: Atrial fibrillation.  Ventricular paced 73 bpm. 11/28/20: VP.  Rate 82 bpm.   08/26/17: VP.  Rate 82 bpm.   05/26/17: atrial fibrillation.  Ventricular paced at 75 bpm  Echo 05/26/18: Study Conclusions   - Left ventricle: The cavity size was normal. Wall thickness was   normal. Systolic function was normal. The estimated ejection   fraction was in the range of 55% to 60%. Although no diagnostic   regional wall motion abnormality was identified, this possibility   cannot be completely excluded on the basis of this study. The   study was not technically sufficient to allow evaluation of LV   diastolic dysfunction due to atrial fibrillation. - Aortic valve: There was no stenosis. - Mitral valve: Moderately calcified annulus. There was trivial   regurgitation. - Left atrium: The atrium was mildly dilated. - Right ventricle: The cavity size was moderately dilated. Pacer   wire or catheter noted in right ventricle. Systolic function was   normal. -  Right atrium: The atrium was severely dilated. - Tricuspid valve: Peak RV-RA gradient (S): 32 mm Hg. - Pulmonary arteries: PA peak pressure: 40 mm Hg (S). - Systemic veins: IVC measured 2.2 cm with > 50% respirophasic   variation, suggesting RA pressure 8 mmHg.   Impressions:   - The patient appeared to be in atrial fibrillation. Normal LV size   and systolic function, EF 55-60%. Moderately dilated RV with   normal systolic function. Mild pulmonary hypertension. Biatrial   enlargement. No significant valvular abnormalities.  Recent Labs: 07/13/2022: TSH 0.95 08/27/2022: B Natriuretic Peptide 283.3; Magnesium 2.2 08/28/2022: ALT 18 09/06/2022: BUN 10; Creatinine, Ser 0.84; Hemoglobin 13.7; Platelets 412.0; Potassium 4.8; Sodium 134    Lipid Panel    Component Value Date/Time   CHOL 141 11/11/2021 0928   TRIG 148.0 11/11/2021 0928   HDL 41.10 11/11/2021 0928   CHOLHDL 3 11/11/2021 0928   VLDL 29.6 11/11/2021 0928   LDLCALC 70 11/11/2021 0928    Wt Readings from Last 3 Encounters:  09/13/22 180 lb 4.8 oz (81.8 kg)  09/09/22 164 lb 12.8 oz (74.8 kg)  09/07/22 165 lb (74.8 kg)     ASSESSMENT AND PLAN:  Chronic diastolic heart failure [facility]) Bradley [name] is doing very well.  He is euvolemic and blood pressures are well controlled.  Continue bisoprolol, Farxiga, and Lasix.  Carvedilol was switched to bisoprolol during his hospitalization.  At some  point [name] was discontinued for unclear reasons.  Given that he has been so stable we will not make any changes at this time.  Low threshold to add Entresto back and reduce amlodipine in the future.  Cor pulmonale (chronic) (HCC) Blood pressure very well controlled on amlodipine and bisoprolol.  Consider adding back Entresto as above.  Essential hypertension Blood pressure very well controlled on amlodipine and bisoprolol.  Consider adding back Entresto as above.  PAD (peripheral artery disease) (HCC) No claudication.  Encouraged  him to keep up his exercise and walk frequently.  Continue aspirin, statin, and bisoprolol.  LDL is at goal.  CAP (community acquired pneumonia) Multiple hospitalizations for rhinovirus and COVID-19.  Fortunately his breathing is back at baseline.  Continue supplemental oxygen.  Carvedilol switched to bisoprolol.   Current medicines are reviewed at length with the patient today.  The patient does not have concerns regarding medicines.  The following changes have been made: Switch losartan to [facility]  Labs/ tests ordered today include:   No orders of the defined types were placed in this encounter.    Disposition:   FU with Amaris Garrette C. Duke Salvia, MD, [facility] in 3 months.    Signed, Nancee Brownrigg C. Duke Salvia, MD, [facility]  09/13/2022 8:45 AM    [facility]

## 2022-09-13 NOTE — Assessment & Plan Note (Signed)
Blood pressure very well controlled on amlodipine and bisoprolol.  Consider adding back Entresto as above. 

## 2022-09-13 NOTE — Patient Instructions (Signed)
Medication Instructions:  Your physician recommends that you continue on your current medications as directed. Please refer to the Current Medication list given to you today.   *If you need a refill on your cardiac medications before your next appointment, please call your pharmacy*  Lab Work: NONE  Testing/Procedures: NONE  Follow-Up: At [facility], you and your health needs are our priority.  As part of our continuing mission to provide you with exceptional heart care, we have created designated Provider Care Teams.  These Care Teams include your primary Cardiologist (physician) and Advanced Practice Providers (APPs -  Physician Assistants and Nurse Practitioners) who all work together to provide you with the care you need, when you need it.  We recommend signing up for the patient portal called "MyChart".  Sign up information is provided on this After Visit Summary.  MyChart is used to connect with patients for Virtual Visits (Telemedicine).  Patients are able to view lab/test results, encounter notes, upcoming appointments, etc.  Non-urgent messages can be sent to your provider as well.   To learn more about what you can do with MyChart, go to [email or web address].    Your next appointment:   6 month(s)  The format for your next appointment:   In Person  Provider:   Tiffany Denham, MD       

## 2022-09-13 NOTE — Assessment & Plan Note (Signed)
Blood pressure very well controlled on amlodipine and bisoprolol.  Consider adding back Entresto as above.

## 2022-09-13 NOTE — Assessment & Plan Note (Addendum)
[name] is doing very well.  He is euvolemic and blood pressures are well controlled.  Continue bisoprolol, Farxiga, and Lasix.  Carvedilol was switched to bisoprolol during his hospitalization.  At some point [name] was discontinued for unclear reasons.  Given that he has been so stable we will not make any changes at this time.  Low threshold to add Entresto back and reduce amlodipine in the future.

## 2022-09-30 ENCOUNTER — Ambulatory Visit (INDEPENDENT_AMBULATORY_CARE_PROVIDER_SITE_OTHER): Payer: BLUE CROSS/BLUE SHIELD

## 2022-09-30 ENCOUNTER — Telehealth: Payer: Self-pay

## 2022-09-30 DIAGNOSIS — I495 Sick sinus syndrome: Secondary | ICD-10-CM

## 2022-09-30 LAB — CUP PACEART REMOTE DEVICE CHECK
Battery Remaining Longevity: 3 mo
Battery Voltage: 2.75 V
Brady Statistic AP VP Percent: 0 %
Brady Statistic AP VS Percent: 0 %
Brady Statistic AS VP Percent: 98.57 %
Brady Statistic AS VS Percent: 1.43 %
Brady Statistic RA Percent Paced: 0 %
Brady Statistic RV Percent Paced: 99.33 %
Date Time Interrogation Session: 20231221025758
Implantable Lead Connection Status: 753985
Implantable Lead Connection Status: 753985
Implantable Lead Implant Date: 20121128
Implantable Lead Implant Date: 20121128
Implantable Lead Location: 753858
Implantable Lead Location: 753860
Implantable Lead Model: 4194
Implantable Lead Model: 5076
Implantable Pulse Generator Implant Date: 20121128
Lead Channel Impedance Value: 342 Ohm
Lead Channel Impedance Value: 399 Ohm
Lead Channel Impedance Value: 4047 Ohm
Lead Channel Impedance Value: 4047 Ohm
Lead Channel Impedance Value: 437 Ohm
Lead Channel Impedance Value: 437 Ohm
Lead Channel Impedance Value: 456 Ohm
Lead Channel Impedance Value: 551 Ohm
Lead Channel Impedance Value: 646 Ohm
Lead Channel Pacing Threshold Amplitude: 0.625 V
Lead Channel Pacing Threshold Amplitude: 1.375 V
Lead Channel Pacing Threshold Pulse Width: 0.4 ms
Lead Channel Pacing Threshold Pulse Width: 0.4 ms
Lead Channel Sensing Intrinsic Amplitude: 11.625 mV
Lead Channel Sensing Intrinsic Amplitude: 11.625 mV
Lead Channel Setting Pacing Amplitude: 2 V
Lead Channel Setting Pacing Amplitude: 2.5 V
Lead Channel Setting Pacing Pulse Width: 0.4 ms
Lead Channel Setting Pacing Pulse Width: 0.4 ms
Lead Channel Setting Sensing Sensitivity: 4 mV
Zone Setting Status: 755011
Zone Setting Status: 755011

## 2022-09-30 NOTE — Telephone Encounter (Signed)
Scheduled remote reviewed. Normal device function.   The device reached the elective replacement indicator on 08/28/2022, sent to triage There were 3 ventricular arrhythmias detected, 2 were short NSVT and one was greater than 20 beats and fell into the VT monitor only zone, sent to triage. No next remote scheduled. Hassell Halim, RN, CCDS, [employer]    Patient needs to get in ASAP with Dr. Ladona Ridgel to discuss and schedule gen change.   Also, note optivol is elevated. He knows to watch his salt intake.   Reached RRT on 08/28/2022  1 NSVT Episode greater than 20 beats.  Patient feels great no symptoms.

## 2022-10-01 ENCOUNTER — Emergency Department (HOSPITAL_COMMUNITY): Payer: Medicare Other

## 2022-10-01 ENCOUNTER — Inpatient Hospital Stay (HOSPITAL_COMMUNITY): Payer: Medicare Other

## 2022-10-01 ENCOUNTER — Inpatient Hospital Stay (HOSPITAL_COMMUNITY)
Admission: EM | Admit: 2022-10-01 | Discharge: 2022-10-07 | DRG: 286 | Disposition: A | Discharge status: Home / Self Care | Payer: Medicare Other | Attending: Internal Medicine | Admitting: Internal Medicine

## 2022-10-01 ENCOUNTER — Encounter (HOSPITAL_COMMUNITY): Payer: Self-pay

## 2022-10-01 DIAGNOSIS — J441 Chronic obstructive pulmonary disease with (acute) exacerbation: Secondary | ICD-10-CM | POA: Diagnosis present

## 2022-10-01 DIAGNOSIS — I110 Hypertensive heart disease with heart failure: Secondary | ICD-10-CM | POA: Diagnosis present

## 2022-10-01 DIAGNOSIS — Z7982 Long term (current) use of aspirin: Secondary | ICD-10-CM | POA: Diagnosis not present

## 2022-10-01 DIAGNOSIS — I480 Paroxysmal atrial fibrillation: Secondary | ICD-10-CM | POA: Diagnosis present

## 2022-10-01 DIAGNOSIS — R079 Chest pain, unspecified: Secondary | ICD-10-CM | POA: Diagnosis not present

## 2022-10-01 DIAGNOSIS — I252 Old myocardial infarction: Secondary | ICD-10-CM

## 2022-10-01 DIAGNOSIS — Z9981 Dependence on supplemental oxygen: Secondary | ICD-10-CM | POA: Diagnosis not present

## 2022-10-01 DIAGNOSIS — D638 Anemia in other chronic diseases classified elsewhere: Secondary | ICD-10-CM | POA: Diagnosis present

## 2022-10-01 DIAGNOSIS — E1151 Type 2 diabetes mellitus with diabetic peripheral angiopathy without gangrene: Secondary | ICD-10-CM | POA: Diagnosis present

## 2022-10-01 DIAGNOSIS — U071 COVID-19: Secondary | ICD-10-CM

## 2022-10-01 DIAGNOSIS — Z87891 Personal history of nicotine dependence: Secondary | ICD-10-CM

## 2022-10-01 DIAGNOSIS — Z79899 Other long term (current) drug therapy: Secondary | ICD-10-CM

## 2022-10-01 DIAGNOSIS — E1165 Type 2 diabetes mellitus with hyperglycemia: Secondary | ICD-10-CM | POA: Diagnosis present

## 2022-10-01 DIAGNOSIS — Z803 Family history of malignant neoplasm of breast: Secondary | ICD-10-CM

## 2022-10-01 DIAGNOSIS — I272 Pulmonary hypertension, unspecified: Secondary | ICD-10-CM | POA: Diagnosis present

## 2022-10-01 DIAGNOSIS — J9622 Acute and chronic respiratory failure with hypercapnia: Secondary | ICD-10-CM | POA: Diagnosis present

## 2022-10-01 DIAGNOSIS — R0602 Shortness of breath: Secondary | ICD-10-CM | POA: Diagnosis present

## 2022-10-01 DIAGNOSIS — E785 Hyperlipidemia, unspecified: Secondary | ICD-10-CM | POA: Diagnosis present

## 2022-10-01 DIAGNOSIS — I2510 Atherosclerotic heart disease of native coronary artery without angina pectoris: Secondary | ICD-10-CM | POA: Diagnosis present

## 2022-10-01 DIAGNOSIS — I509 Heart failure, unspecified: Secondary | ICD-10-CM | POA: Diagnosis not present

## 2022-10-01 DIAGNOSIS — I2489 Other forms of acute ischemic heart disease: Secondary | ICD-10-CM | POA: Diagnosis present

## 2022-10-01 DIAGNOSIS — I482 Chronic atrial fibrillation, unspecified: Secondary | ICD-10-CM | POA: Diagnosis present

## 2022-10-01 DIAGNOSIS — R7989 Other specified abnormal findings of blood chemistry: Secondary | ICD-10-CM | POA: Diagnosis not present

## 2022-10-01 DIAGNOSIS — N400 Enlarged prostate without lower urinary tract symptoms: Secondary | ICD-10-CM | POA: Diagnosis present

## 2022-10-01 DIAGNOSIS — J9621 Acute and chronic respiratory failure with hypoxia: Secondary | ICD-10-CM | POA: Diagnosis present

## 2022-10-01 DIAGNOSIS — Z950 Presence of cardiac pacemaker: Secondary | ICD-10-CM | POA: Diagnosis not present

## 2022-10-01 DIAGNOSIS — I214 Non-ST elevation (NSTEMI) myocardial infarction: Secondary | ICD-10-CM | POA: Diagnosis not present

## 2022-10-01 DIAGNOSIS — R072 Precordial pain: Secondary | ICD-10-CM | POA: Diagnosis not present

## 2022-10-01 DIAGNOSIS — Z7951 Long term (current) use of inhaled steroids: Secondary | ICD-10-CM | POA: Diagnosis not present

## 2022-10-01 DIAGNOSIS — J1282 Pneumonia due to coronavirus disease 2019: Secondary | ICD-10-CM | POA: Diagnosis not present

## 2022-10-01 DIAGNOSIS — J189 Pneumonia, unspecified organism: Secondary | ICD-10-CM | POA: Diagnosis present

## 2022-10-01 DIAGNOSIS — I5043 Acute on chronic combined systolic (congestive) and diastolic (congestive) heart failure: Secondary | ICD-10-CM | POA: Diagnosis present

## 2022-10-01 DIAGNOSIS — Z8616 Personal history of COVID-19: Secondary | ICD-10-CM

## 2022-10-01 DIAGNOSIS — Z8673 Personal history of transient ischemic attack (TIA), and cerebral infarction without residual deficits: Secondary | ICD-10-CM

## 2022-10-01 DIAGNOSIS — I472 Ventricular tachycardia, unspecified: Secondary | ICD-10-CM | POA: Diagnosis not present

## 2022-10-01 DIAGNOSIS — Z833 Family history of diabetes mellitus: Secondary | ICD-10-CM

## 2022-10-01 DIAGNOSIS — Z8249 Family history of ischemic heart disease and other diseases of the circulatory system: Secondary | ICD-10-CM

## 2022-10-01 LAB — CBC WITH DIFFERENTIAL/PLATELET
Abs Immature Granulocytes: 0.04 10*3/uL (ref 0.00–0.07)
Basophils Absolute: 0 10*3/uL (ref 0.0–0.1)
Basophils Relative: 0 %
Eosinophils Absolute: 0 10*3/uL (ref 0.0–0.5)
Eosinophils Relative: 0 %
HCT: 43.4 % (ref 39.0–52.0)
Hemoglobin: 14.2 g/dL (ref 13.0–17.0)
Immature Granulocytes: 0 %
Lymphocytes Relative: 17 %
Lymphs Abs: 1.7 10*3/uL (ref 0.7–4.0)
MCH: 31.6 pg (ref 26.0–34.0)
MCHC: 32.7 g/dL (ref 30.0–36.0)
MCV: 96.4 fL (ref 80.0–100.0)
Monocytes Absolute: 0.6 10*3/uL (ref 0.1–1.0)
Monocytes Relative: 6 %
Neutro Abs: 7.5 10*3/uL (ref 1.7–7.7)
Neutrophils Relative %: 77 %
Platelets: 198 10*3/uL (ref 150–400)
RBC: 4.5 MIL/uL (ref 4.22–5.81)
RDW: 15.2 % (ref 11.5–15.5)
WBC: 9.8 10*3/uL (ref 4.0–10.5)
nRBC: 0 % (ref 0.0–0.2)

## 2022-10-01 LAB — COMPREHENSIVE METABOLIC PANEL
ALT: 15 U/L (ref 0–44)
AST: 37 U/L (ref 15–41)
Albumin: 3.8 g/dL (ref 3.5–5.0)
Alkaline Phosphatase: 75 U/L (ref 38–126)
Anion gap: 11 (ref 5–15)
BUN: 12 mg/dL (ref 8–23)
CO2: 24 mmol/L (ref 22–32)
Calcium: 8.5 mg/dL — ABNORMAL LOW (ref 8.9–10.3)
Chloride: 102 mmol/L (ref 98–111)
Creatinine, Ser: 0.71 mg/dL (ref 0.61–1.24)
GFR, Estimated: >60 mL/min (ref >60)
Glucose, Bld: 141 mg/dL — ABNORMAL HIGH (ref 70–99)
Potassium: 4.7 mmol/L (ref 3.5–5.1)
Sodium: 137 mmol/L (ref 135–145)
Total Bilirubin: 1.4 mg/dL — ABNORMAL HIGH (ref 0.3–1.2)
Total Protein: 7.2 g/dL (ref 6.5–8.1)

## 2022-10-01 LAB — BLOOD GAS, VENOUS
Acid-Base Excess: 3.2 mmol/L — ABNORMAL HIGH (ref 0.0–2.0)
Acid-base deficit: 19.1 mmol/L — ABNORMAL HIGH (ref 0.0–2.0)
Bicarbonate: 8.8 mmol/L — ABNORMAL LOW (ref 20.0–28.0)
Bicarbonate: 28.5 mmol/L — ABNORMAL HIGH (ref 20.0–28.0)
O2 Saturation: 89.1 %
O2 Saturation: 77.2 %
Patient temperature: 37
Patient temperature: 37.1
pCO2, Ven: 27 mmHg — ABNORMAL LOW (ref 44–60)
pCO2, Ven: 45 mmHg (ref 44–60)
pH, Ven: 7.12 — CL (ref 7.25–7.43)
pH, Ven: 7.41 (ref 7.25–7.43)
pO2, Ven: 67 mmHg — ABNORMAL HIGH (ref 32–45)
pO2, Ven: 45 mmHg (ref 32–45)

## 2022-10-01 LAB — LACTATE DEHYDROGENASE: LDH: 138 U/L (ref 98–192)

## 2022-10-01 LAB — TROPONIN I (HIGH SENSITIVITY)
Troponin I (High Sensitivity): 577 ng/L (ref <18)
Troponin I (High Sensitivity): 685 ng/L (ref <18)

## 2022-10-01 LAB — URINALYSIS, ROUTINE W REFLEX MICROSCOPIC
Bacteria, UA: NONE SEEN
Bilirubin Urine: NEGATIVE
Glucose, UA: >=500 mg/dL — AB
Hgb urine dipstick: NEGATIVE
Ketones, ur: NEGATIVE mg/dL
Leukocytes,Ua: NEGATIVE
Nitrite: NEGATIVE
Protein, ur: NEGATIVE mg/dL
Specific Gravity, Urine: 1.016 (ref 1.005–1.030)
pH: 5 (ref 5.0–8.0)

## 2022-10-01 LAB — RESP PANEL BY RT-PCR (RSV, FLU A&B, COVID)  RVPGX2
Influenza A by PCR: NEGATIVE
Influenza B by PCR: NEGATIVE
Resp Syncytial Virus by PCR: NEGATIVE
SARS Coronavirus 2 by RT PCR: POSITIVE — AB

## 2022-10-01 LAB — TSH: TSH: 0.285 u[IU]/mL — ABNORMAL LOW (ref 0.350–4.500)

## 2022-10-01 LAB — FERRITIN: Ferritin: 101 ng/mL (ref 24–336)

## 2022-10-01 LAB — BRAIN NATRIURETIC PEPTIDE: B Natriuretic Peptide: 188.5 pg/mL — ABNORMAL HIGH (ref 0.0–100.0)

## 2022-10-01 MED ORDER — HEPARIN (PORCINE) 25000 UT/250ML-% IV SOLN
1500.0000 [IU]/h | INTRAVENOUS | Status: DC
  Administered 2022-10-01: 950 [IU]/h via INTRAVENOUS
  Administered 2022-10-03 – 2022-10-05 (×4): 1500 [IU]/h via INTRAVENOUS
  Filled 2022-10-01 (×6): qty 250

## 2022-10-01 MED ORDER — ATORVASTATIN CALCIUM 20 MG PO TABS
20.0000 mg | ORAL_TABLET | Freq: Every evening | ORAL | Status: DC
  Administered 2022-10-02 – 2022-10-06 (×4): 20 mg via ORAL
  Filled 2022-10-01 (×5): qty 1

## 2022-10-01 MED ORDER — ASPIRIN 81 MG PO TBEC
81.0000 mg | DELAYED_RELEASE_TABLET | Freq: Every morning | ORAL | Status: DC
  Administered 2022-10-02 – 2022-10-07 (×5): 81 mg via ORAL
  Filled 2022-10-01 (×5): qty 1

## 2022-10-01 MED ORDER — SODIUM CHLORIDE 0.9 % IV SOLN
1.0000 g | Freq: Every day | INTRAVENOUS | Status: AC
  Administered 2022-10-01 – 2022-10-05 (×5): 1 g via INTRAVENOUS
  Filled 2022-10-01 (×5): qty 10

## 2022-10-01 MED ORDER — ONDANSETRON HCL 4 MG/2ML IJ SOLN: 4.0000 mg | Freq: Four times a day (QID) | INTRAMUSCULAR | Status: DC | PRN

## 2022-10-01 MED ORDER — PREDNISONE 20 MG PO TABS
40.0000 mg | ORAL_TABLET | Freq: Every day | ORAL | Status: AC
  Administered 2022-10-03 – 2022-10-05 (×3): 40 mg via ORAL
  Filled 2022-10-01 (×4): qty 2

## 2022-10-01 MED ORDER — IOHEXOL 300 MG/ML  SOLN: 75.0000 mL | Freq: Once | INTRAMUSCULAR | Status: DC | PRN

## 2022-10-01 MED ORDER — LEVALBUTEROL HCL 0.63 MG/3ML IN NEBU: 0.6300 mg | INHALATION_SOLUTION | Freq: Four times a day (QID) | RESPIRATORY_TRACT | Status: DC | PRN

## 2022-10-01 MED ORDER — ONDANSETRON HCL 4 MG PO TABS: 4.0000 mg | ORAL_TABLET | Freq: Four times a day (QID) | ORAL | Status: DC | PRN

## 2022-10-01 MED ORDER — IPRATROPIUM-ALBUTEROL 0.5-2.5 (3) MG/3ML IN SOLN
3.0000 mL | Freq: Two times a day (BID) | RESPIRATORY_TRACT | Status: DC
  Administered 2022-10-02 – 2022-10-07 (×11): 3 mL via RESPIRATORY_TRACT
  Filled 2022-10-01 (×11): qty 3

## 2022-10-01 MED ORDER — ACETAMINOPHEN 500 MG PO TABS: 1000.0000 mg | ORAL_TABLET | Freq: Three times a day (TID) | ORAL | Status: DC | PRN

## 2022-10-01 MED ORDER — IPRATROPIUM-ALBUTEROL 0.5-2.5 (3) MG/3ML IN SOLN: 3.0000 mL | Freq: Four times a day (QID) | RESPIRATORY_TRACT | Status: DC

## 2022-10-01 MED ORDER — HEPARIN BOLUS VIA INFUSION
4000.0000 [IU] | Freq: Once | INTRAVENOUS | Status: AC
  Administered 2022-10-01: 4000 [IU] via INTRAVENOUS
  Filled 2022-10-01: qty 4000

## 2022-10-01 MED ORDER — FUROSEMIDE 10 MG/ML IJ SOLN
40.0000 mg | Freq: Once | INTRAMUSCULAR | Status: AC
  Administered 2022-10-01: 40 mg via INTRAVENOUS
  Filled 2022-10-01: qty 4

## 2022-10-01 MED ORDER — METHYLPREDNISOLONE SODIUM SUCC 40 MG IJ SOLR
40.0000 mg | Freq: Two times a day (BID) | INTRAMUSCULAR | Status: AC
  Administered 2022-10-01 – 2022-10-02 (×2): 40 mg via INTRAVENOUS
  Filled 2022-10-01 (×2): qty 1

## 2022-10-01 NOTE — ED Triage Notes (Signed)
Pt arrived via EMS, from home. C/o SOB, hx of COPD. Attempted to use breathing treatments at home with no improvement.   Given PTA 10 mg albuterol Atrovent 125 mg of solumedrol IV 22G L hand

## 2022-10-01 NOTE — ED Notes (Signed)
ED TO INPATIENT HANDOFF REPORT  Name/Age/Gender Bradley Jock Sr. 81 y.o. male  Code Status    Code Status Orders  (From admission, onward)           Start     Ordered   10/01/22 2020  Full code  Continuous       Question:  By:  Answer:  Consent: discussion documented in EHR   10/01/22 2022           Code Status History     Date Active Date Inactive Code Status Order ID Comments User Context   08/27/2022 0821 08/30/2022 2048 Full Code [id number]  Reubin Milan, MD ED   08/23/2022 1025 08/25/2022 1829 Full Code [id number]  Lequita Halt, MD ED   07/18/2022 2350 07/23/2022 1856 Full Code [id number]  Deneise Lever, MD Inpatient   08/29/2021 2141 09/02/2021 1810 Full Code [id number]  Vianne Bulls, MD ED   07/29/2021 2251 08/03/2021 1727 Full Code [id number]  Shalhoub, [name], MD ED   03/05/2021 0411 03/12/2021 0149 Full Code [id number]  Vianne Bulls, MD ED   09/13/2019 2240 09/19/2019 1315 Full Code [id number]  Rise Patience, MD ED   05/07/2019 1603 05/11/2019 1600 Full Code [id number]  Karmen Bongo, MD ED   12/22/2018 0206 12/26/2018 1457 Full Code [id number]  Vianne Bulls, MD ED   09/19/2018 2203 09/25/2018 1711 Full Code [id number]  Vilma Prader, MD Inpatient   05/25/2018 1627 05/28/2018 1837 Full Code [id number]  Kerney Elbe, [place] Inpatient   02/21/2017 1608 02/24/2017 1701 DNR [id number]  Elmarie Shiley, MD Inpatient   12/19/2015 0334 12/20/2015 1929 Full Code [id number]  Ivor Costa, MD Inpatient   08/22/2014 1700 08/25/2014 1741 Full Code [id number]  Donne Hazel, MD Inpatient   09/27/2012 1454 09/29/2012 1704 Full Code [id number]  Cliffton Asters, RN Inpatient   07/23/2012 2231 07/26/2012 2005 Full Code [id number]  Rise Patience, MD Inpatient   07/06/2012 0741 07/10/2012 1715 Full Code [id number]  Irish Elders, RN Inpatient   09/04/2011 1800 09/08/2011 1840 Full Code [id number]  Mendel Corning, MD Inpatient        Home/SNF/Other Home  Chief Complaint Pneumonia due to COVID-19 virus [U07.1, J12.82]  Level of Care/Admitting Diagnosis ED Disposition     ED Disposition  Admit   Condition  --   [place]: [place] [[id number]]  Level of Care: Progressive [102]  Admit to Progressive based on following criteria: RESPIRATORY PROBLEMS hypoxemic/hypercapnic respiratory failure that is responsive to NIPPV (BiPAP) or High Flow Nasal Cannula (6-80 lpm). Frequent assessment/intervention, no > Q2 hrs < Q4 hrs, to maintain oxygenation and pulmonary hygiene.  Admit to Progressive based on following criteria: CARDIOVASCULAR & THORACIC of moderate stability with acute coronary syndrome symptoms/low risk myocardial infarction/hypertensive urgency/arrhythmias/heart failure potentially compromising stability and stable post cardiovascular intervention patients.  May admit patient to [facility] or [facility] if equivalent level of care is available:: Yes  Covid Evaluation: Confirmed COVID Positive  Diagnosis: Pneumonia due to COVID-19 virus [id number]  Admitting Physician: Clance Boll [id number]  Attending Physician: Clance Boll [id number]  Certification:: I certify this patient will need inpatient services for at least 2 midnights  Estimated Length of Stay: 3          Medical History Past Medical History:  Diagnosis Date   Abnormal CT scan, chest 2012   CT chest, several lymphadenopathies. Sees pulmonary   Anemia  intermittent   Arthritis    "?back" (09/19/2018)   Atrial fibrillation (HCC)    s/p AV node ablation & BiV PPM implantation 09/08/11 (op dictation pending)   BPH (benign prostatic hyperplasia)    Saw Dr Reece Agar 2004, normal renal u/s   Bronchitis 08/26/2017   CAD (coronary artery disease)    CHF (congestive heart failure) ([place])    Thought primarily to be non-systolic although EF down (EF 45-50% 12/2010, down to 35-40% 09/05/11), cath  2008 with no CAD, nuclear study 07/2011 showing Small area of reversibility in the distal ant/lat wall the left ventricle suspicious for ischemia/septal wall HK but felt to be low risk  (per D/C Summary 07/2011)   Chronic bronchitis ([place])    "get it ~ q yr"   Chronic diastolic heart failure ([place])         Chronic respiratory failure with hypoxia and hypercapnia ([place]) 09/14/2010   Followed in Pulmonary clinic/ [facility]/ [place]  Started on 02 2lpm at discharge 09/23/11   - PFT's 10/28/2011  FEV1  1.40 (51%)  with ratio 70 and no better p B2 and DLCO 53 corrects to 101    - PFT's 10/18/2014    FEV1 1.71 (59%) with ratio 68 and no sign change p B2 and DLCO 53 corrects to 85  -  HC03   07/28/20  = 33  -  HCO3   03/17/21     = 31     Heart murmur    HLD (hyperlipidemia)    HTN (hypertension)    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   Pleural effusion 2008   S/p decortication   Pneumonia 08/02/2011   Pulmonary HTN ([place])    per cath 2008   Type II diabetes mellitus ([place]) 1999    Allergies Allergies  Allergen Reactions   Hydrocodone Other (See Comments)    "given to him in the hospital; went thru withdrawals once home; dr said not to take it again" (09/27/2012) dizziness   Ultram [Tramadol] Other (See Comments)    "given to him in the hospital; went thru withdrawals once home; dr said not to take it again" (09/27/2012) dizziness   Cialis [Tadalafil] Other (See Comments)    Headache Backache  Dizziness    Avelox [Moxifloxacin Hydrochloride] Itching and Other (See Comments)    Headache Dizziness    IV Location/Drains/Wounds Patient Lines/Drains/Airways Status     Active Line/Drains/Airways     Name Placement date Placement time Site Days   Peripheral IV 10/01/22 18 G 1.25" Anterior;Distal;Right  Wrist 10/01/22  1445  Wrist  less than 1            Labs/Imaging Results for orders placed or performed during the hospital encounter of 10/01/22 (from the past 48 hour(s))  Resp panel by RT-PCR (RSV, Flu A&B, Covid) Anterior Nasal Swab     Status: Abnormal   Collection Time: 10/01/22 12:46 PM   Specimen: Anterior Nasal Swab  Result Value Ref Range   SARS Coronavirus 2 by RT PCR POSITIVE (A) NEGATIVE    Comment: (NOTE) SARS-CoV-2 target nucleic acids are DETECTED.  The SARS-CoV-2 RNA is generally detectable in upper respiratory specimens during the acute phase of infection. Positive results are indicative of the  presence of the identified virus, but do not rule out bacterial infection or co-infection with other pathogens not detected by the test. Clinical correlation with patient history and other diagnostic information is necessary to determine patient infection status. The expected result is Negative.  Fact Sheet for Patients: EntrepreneurPulse.com.au  Fact Sheet for Healthcare Providers: IncredibleEmployment.be  This test is not yet approved or cleared by the [place] FDA and  has been authorized for detection and/or diagnosis of SARS-CoV-2 by FDA under an Emergency Use Authorization (EUA).  This EUA will remain in effect (meaning this test can be used) for the duration of  the COVID-19 declaration under Section 564(b)(1) of the A ct, 21 U.S.C. section 360bbb-3(b)(1), unless the authorization is terminated or revoked sooner.     Influenza A by PCR NEGATIVE NEGATIVE   Influenza B by PCR NEGATIVE NEGATIVE    Comment: (NOTE) The Xpert Xpress SARS-CoV-2/FLU/RSV plus assay is intended as an aid in the diagnosis of influenza from Nasopharyngeal swab specimens and should not be used as a sole basis for treatment. Nasal washings and aspirates are unacceptable for Xpert Xpress SARS-CoV-2/FLU/RSV testing.  Fact Sheet for  Patients: EntrepreneurPulse.com.au  Fact Sheet for Healthcare Providers: IncredibleEmployment.be  This test is not yet approved or cleared by the [place] FDA and has been authorized for detection and/or diagnosis of SARS-CoV-2 by FDA under an Emergency Use Authorization (EUA). This EUA will remain in effect (meaning this test can be used) for the duration of the COVID-19 declaration under Section 564(b)(1) of the Act, 21 U.S.C. section 360bbb-3(b)(1), unless the authorization is terminated or revoked.     Resp Syncytial Virus by PCR NEGATIVE NEGATIVE    Comment: (NOTE) Fact Sheet for Patients: EntrepreneurPulse.com.au  Fact Sheet for Healthcare Providers: IncredibleEmployment.be  This test is not yet approved or cleared by the [place] FDA and has been authorized for detection and/or diagnosis of SARS-CoV-2 by FDA under an Emergency Use Authorization (EUA). This EUA will remain in effect (meaning this test can be used) for the duration of the COVID-19 declaration under Section 564(b)(1) of the Act, 21 U.S.C. section 360bbb-3(b)(1), unless the authorization is terminated or revoked.  Performed at [facility], [place] [address], [place], [place] [address]   Troponin I (High Sensitivity)     Status: Abnormal   Collection Time: 10/01/22 12:55 PM  Result Value Ref Range   Troponin I (High Sensitivity) 577 (HH) <18 ng/L    Comment: CRITICAL RESULT CALLED TO, READ BACK BY AND VERIFIED WITH: Buford Dresser RN @ 1416 ON 10/01/2022 BY Jason Fila, K (NOTE) Elevated high sensitivity troponin I (hsTnI) values and significant  changes across serial measurements may suggest ACS but many other  chronic and acute conditions are known to elevate hsTnI results.  Refer to the Links section for chest pain algorithms and additional  guidance. Performed at [facility], [place] [address], [place], [place] [address]   Brain natriuretic peptide     Status: Abnormal   Collection Time: 10/01/22 12:55 PM  Result Value Ref Range   B Natriuretic Peptide 188.5 (H) 0.0 - 100.0 pg/mL    Comment: Performed at [facility], [place] [address], [place], [place] [address]  CBC with Differential     Status: None   Collection Time: 10/01/22 12:55 PM  Result Value Ref Range   WBC 9.8 4.0 - 10.5 K/uL   RBC 4.50 4.22 - 5.81 MIL/uL   Hemoglobin 14.2 13.0 - 17.0  g/dL   HCT 00.8 67.6 - 19.5 %   MCV 96.4 80.0 - 100.0 fL   MCH 31.6 26.0 - 34.0 pg   MCHC 32.7 30.0 - 36.0 g/dL   RDW 09.3 26.7 - 12.4 %   Platelets 198 150 - 400 K/uL   nRBC 0.0 0.0 - 0.2 %   Neutrophils Relative % 77 %   Neutro Abs 7.5 1.7 - 7.7 K/uL   Lymphocytes Relative 17 %   Lymphs Abs 1.7 0.7 - 4.0 K/uL   Monocytes Relative 6 %   Monocytes Absolute 0.6 0.1 - 1.0 K/uL   Eosinophils Relative 0 %   Eosinophils Absolute 0.0 0.0 - 0.5 K/uL   Basophils Relative 0 %   Basophils Absolute 0.0 0.0 - 0.1 K/uL   Immature Granulocytes 0 %   Abs Immature Granulocytes 0.04 0.00 - 0.07 K/uL    Comment: Performed at [facility], [address] [place], [place] [address]  Troponin I (High Sensitivity)     Status: Abnormal   Collection Time: 10/01/22  2:28 PM  Result Value Ref Range   Troponin I (High Sensitivity) 685 (HH) <18 ng/L    Comment: CRITICAL RESULT CALLED TO, READ BACK BY AND VERIFIED WITH ROBIN, C RN @ 1517 ON 10/01/2022 BY Deedra Ehrich, K (NOTE) Elevated high sensitivity troponin I (hsTnI) values and significant  changes across serial measurements may suggest ACS but many other  chronic and acute conditions are known to elevate hsTnI results.  Refer to the "Links" section for chest pain algorithms and additional  guidance. Performed at [facility], [address], [place], [place] [address]   Comprehensive metabolic panel     Status: Abnormal   Collection Time:  10/01/22  2:28 PM  Result Value Ref Range   Sodium 137 135 - 145 mmol/L   Potassium 4.7 3.5 - 5.1 mmol/L   Chloride 102 98 - 111 mmol/L   CO2 24 22 - 32 mmol/L   Glucose, Bld 141 (H) 70 - 99 mg/dL    Comment: Glucose reference range applies only to samples taken after fasting for at least 8 hours.   BUN 12 8 - 23 mg/dL   Creatinine, Ser 5.05 0.61 - 1.24 mg/dL   Calcium 8.5 (L) 8.9 - 10.3 mg/dL   Total Protein 7.2 6.5 - 8.1 g/dL   Albumin 3.8 3.5 - 5.0 g/dL   AST 37 15 - 41 U/L   ALT 15 0 - 44 U/L   Alkaline Phosphatase 75 38 - 126 U/L   Total Bilirubin 1.4 (H) 0.3 - 1.2 mg/dL   GFR, Estimated >39 >76 mL/min    Comment: (NOTE) Calculated using the CKD-EPI Creatinine Equation (2021)    Anion gap 11 5 - 15    Comment: Performed at [facility], [address], [place], [place] [address]   *Note: Due to a large number of results and/or encounters for the requested time period, some results have not been displayed. A complete set of results can be found in Results Review.   DG Chest 2 View  Result Date: 10/01/2022 CLINICAL DATA:  Shortness of breath, cough EXAM: CHEST - 2 VIEW COMPARISON:  08/30/2022 FINDINGS: Cardiomegaly with left chest multi lead pacer. Diffuse bilateral interstitial opacity, similar to prior examinations. Disc degenerative disease of the thoracic spine. IMPRESSION: Cardiomegaly with diffuse bilateral interstitial opacity, similar to prior examinations, likely edema. No new or focal airspace opacity. Electronically Signed   By: Tobey Grim  Laqueta Carina M.D.   On: 10/01/2022 13:49   CUP PACEART REMOTE DEVICE CHECK  Result Date: 09/30/2022 Scheduled remote reviewed. Normal device function.  The device reached the elective replacement indicator on 08/28/2022, sent to triage There were 3 ventricular arrhythmias detected, 2 were short NSVT and one was greater than 20 beats and fell into the VT monitor only zone, sent to triage. No next remote scheduled. Kathy Breach, RN, CCDS, [employer]   Pending Labs Unresulted Labs (From admission, onward)     Start     Ordered   10/02/22 0500  CBC  Daily,   R      10/01/22 1929   10/02/22 0500  Comprehensive metabolic panel  Tomorrow morning,   R        10/01/22 2022   10/02/22 0400  Heparin level (unfractionated)  Once-Timed,   URGENT        10/01/22 1929   10/01/22 2023  Blood gas, venous  Once,   R        10/01/22 2022   10/01/22 2022  C-reactive protein  Once,   R        10/01/22 2022   10/01/22 2022  Ferritin  Once,   R        10/01/22 2022   10/01/22 2022  Lactate dehydrogenase  Once,   R        10/01/22 2022   10/01/22 2022  Procalcitonin - Baseline  ONCE - URGENT,   URGENT        10/01/22 2022   10/01/22 2021  TSH  Once,   R        10/01/22 2022   10/01/22 2021  Urinalysis, Routine w reflex microscopic  Once,   R        10/01/22 2022   10/01/22 2018  MRSA Next Gen by PCR, Nasal  (COPD / Pneumonia / Cellulitis / Lower Extremity Wound)  Once,   R        10/01/22 2022   10/01/22 2018  Culture, blood (Routine X 2) w Reflex to ID Panel  (COPD / Pneumonia / Cellulitis / Lower Extremity Wound)  BLOOD CULTURE X 2,   R      10/01/22 2022   10/01/22 2018  Expectorated Sputum Assessment w Gram Stain, Rflx to Resp Cult  (COPD / Pneumonia / Cellulitis / Lower Extremity Wound)  Once,   R        10/01/22 2022            Vitals/Pain Today's Vitals   10/01/22 1725 10/01/22 1730 10/01/22 1900 10/01/22 1920  BP: (!) 115/93 (!) 125/93 128/66   Pulse: 88 65 70   Resp: (!) 28 (!) 23 (!) 21   Temp:  97.9 F (36.6 C)    TempSrc:  Oral    SpO2: 92% (!) 82% 95%   Weight:    77.1 kg  Height:    5\' 9"  (1.753 m)  PainSc:        Isolation Precautions No active isolations  Medications Medications  heparin ADULT infusion 100 units/mL (25000 units/269mL) (950 Units/hr Intravenous New Bag/Given 10/01/22 1955)  aspirin EC tablet 81 mg (has no administration in time range)  acetaminophen  (TYLENOL) tablet 1,000 mg (has no administration in time range)  atorvastatin (LIPITOR) tablet 20 mg (has no administration in time range)  cefTRIAXone (ROCEPHIN) 1 g in sodium chloride 0.9 % 100 mL IVPB (has no administration in time range)  methylPREDNISolone sodium succinate (SOLU-MEDROL) 40 mg/mL injection 40 mg (has no administration in time range)    Followed by  predniSONE (DELTASONE) tablet 40 mg (has no administration in time range)  ipratropium-albuterol (DUONEB) 0.5-2.5 (3) MG/3ML nebulizer solution 3 mL (has no administration in time range)  levalbuterol (XOPENEX) nebulizer solution 0.63 mg (has no administration in time range)  ondansetron (ZOFRAN) tablet 4 mg (has no administration in time range)    Or  ondansetron (ZOFRAN) injection 4 mg (has no administration in time range)  iohexol (OMNIPAQUE) 300 MG/ML solution 75 mL (has no administration in time range)  furosemide (LASIX) injection 40 mg (40 mg Intravenous Given 10/01/22 1626)  heparin bolus via infusion 4,000 Units (4,000 Units Intravenous Bolus from Bag 10/01/22 1956)    Mobility walks with device.

## 2022-10-01 NOTE — H&P (Incomplete)
History and Physical    Bradley [name] LOV:[id number] DOB: 02/07/1941 DOA: 10/01/2022  PCP: Bradley Plump, MD  Patient coming from: home  I have personally briefly reviewed patient's old medical records in [facility]  Chief Complaint:  DOE x 1 week  HPI: Bradley NOBREGA Sr. is a 81 y.o. male with medical history significant of  history of anemia, osteoarthritis, chronic atrial fibrillation status post AV nodal ablation and biventricular pacemaker, BPH, bronchitis, CAD, chronic diastolic heart failure, pulmonary hypertension, COPD, chronic respiratory failure, hyperlipidemia, hypertension, diabetes mellitus type 2, intracranial bleed, migraine headache, insomnia, pleural effusions.  Patient also has interim history of admission  08/27/22-08/30/22 with discharge diagnosis of CAP/COPD exacerbation  in setting of COVID infection diagnosed 08/23/22. Patient was treated  antibiotics  as well as steroids for COPD exacerbation with good response and was discharged home.  Patient now return to ED close to one month later with complaints of progressive DOE over the last 1 week.  Patient notes his home medications were not helpful in improving his symptoms.  In the filed patient by give albuterol 10mg  , atrovent , solumedrol 125 mg iv and transported to ED.  ED Course:  In ED Vitals:afeb,  BP 133/77, hr 72, rr 22, sat 92%, drop to 82%   EKG: V paced  Resp panel :remain + covid  Labs:  Wbc 9.8, hg 14.2, plt 198  BNP 188.5 N a 137, K 4.7, CL 102, cr 0.71 CE Cxr: Cardiomegaly with diffuse bilateral interstitial opacity, similar to prior examinations, likely edema. No new or focal airspace opacity. Tx lasix. Heparin drip   Review of Systems: As per HPI otherwise 10 point review of systems negative.   Past Medical History:  Diagnosis Date   Abnormal CT scan, chest 2012   CT chest, several lymphadenopathies. Sees pulmonary   Anemia    intermittent   Arthritis    "?back"  (09/19/2018)   Atrial fibrillation (HCC)    s/p AV node ablation & BiV PPM implantation 09/08/11 (op dictation pending)   BPH (benign prostatic hyperplasia)    Saw Dr 09/10/11 2004, normal renal u/s   Bronchitis 08/26/2017   CAD (coronary artery disease)    CHF (congestive heart failure) (HCC)    Thought primarily to be non-systolic although EF down (EF 08/28/2017 12/2010, down to 35-40% 09/05/11), cath 2008 with no CAD, nuclear study 07/2011 showing Small area of reversibility in the distal ant/lat wall the left ventricle suspicious for ischemia/septal wall HK but felt to be low risk  (per D/C Summary 07/2011)   Chronic bronchitis (HCC)    "get it ~ q yr"   Chronic diastolic heart failure (HCC)         Chronic respiratory failure with hypoxia and hypercapnia (HCC) 09/14/2010   Followed in Pulmonary clinic/ [facility]/ [facility]  Started on 02 2lpm at discharge 09/23/11   - PFT's 10/28/2011  FEV1  1.40 (51%)  with ratio 70 and no better p B2 and DLCO 53 corrects to 101    - PFT's 10/18/2014    FEV1 1.71 (59%) with ratio 68 and no sign change p B2 and DLCO 53 corrects to 85  -  HC03   07/28/20  = 33  -  HCO3   03/17/21     = 31     Heart murmur    HLD (hyperlipidemia)    HTN (hypertension)    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   Pleural effusion 2008   S/p decortication   Pneumonia 08/02/2011   Pulmonary HTN (HCC)    per cath 2008   Type II diabetes mellitus (HCC) 1999    Past Surgical History:  Procedure Laterality Date   BI-VENTRICULAR PACEMAKER INSERTION Left 09/08/2011   Procedure: BI-VENTRICULAR PACEMAKER INSERTION (CRT-P);  Surgeon: Marinus Maw, MD;  Location: [facility] CATH LAB;  Service: Cardiovascular;  Laterality: Left;   CARDIAC CATHETERIZATION     "couple times; never had balloon or stent"  (09/19/2018)   CATARACT EXTRACTION W/ INTRAOCULAR LENS  IMPLANT, BILATERAL Bilateral 08/2018   COLONOSCOPY  03/10/11   normal   INSERT / REPLACE / REMOVE PACEMAKER  09/08/11   pacemaker placement   LUNG DECORTICATION     PLEURAL SCARIFICATION     pneumothorax with fibrothorax  ~ 2010   TONSILLECTOMY     "as a kid"      reports that he quit smoking about 42 years ago. His smoking use included cigarettes and cigars. He has a 62.50 pack-year smoking history. He has never used smokeless tobacco. He reports that he does not currently use alcohol. He reports that he does not use drugs.  Allergies  Allergen Reactions   Hydrocodone Other (See Comments)    "given to him in the hospital; went thru withdrawals once home; dr said not to take it again" (09/27/2012) dizziness   Ultram [Tramadol] Other (See Comments)    "given to him in the hospital; went thru withdrawals once home; dr said not to take it again" (09/27/2012) dizziness   Cialis [Tadalafil] Other (See Comments)    Headache Backache  Dizziness    Avelox [Moxifloxacin Hydrochloride] Itching and Other (See Comments)    Headache Dizziness    Family History  Problem Relation Age of Onset   Diabetes Father    Coronary artery disease Father    Polycythemia Mother    Drug abuse Son    Breast cancer Maternal Aunt    Prostate cancer Neg Hx    Colon cancer Neg Hx    *** Prior to Admission medications   Medication Sig Start Date End Date Taking? Authorizing Provider  acetaminophen (TYLENOL) 500 MG tablet Take 1,000 mg by mouth every 8 (eight) hours as needed for headache or moderate pain.    [provider]  albuterol (VENTOLIN HFA) 108 (90 Base) MCG/ACT inhaler Inhale 2 puffs into the lungs every 6 (six) hours as needed for wheezing or shortness of breath. Patient taking differently: Inhale 1 puff into the lungs 2 (two) times daily. 03/11/21   Ghimire, Werner Lean, MD  amLODipine (NORVASC) 10 MG tablet Take 1 tablet (10 mg  total) by mouth daily. Patient taking differently: Take 10 mg by mouth in the morning. 06/07/22   Bradley Plump, MD  aspirin 81 MG tablet Take 81 mg by mouth every morning.     [provider]  atorvastatin (LIPITOR) 20 MG tablet Take 1 tablet (20 mg total) by mouth at bedtime. Patient taking differently: Take 20 mg by mouth every evening. 06/07/22   Bradley Plump, MD  bisoprolol (ZEBETA) 10 MG tablet Take 1 tablet (10 mg total) by mouth daily. 08/26/22   Bradley Plump, MD  budesonide (PULMICORT) 0.5 MG/2ML  nebulizer solution Take 2 mLs (0.5 mg total) by nebulization 2 (two) times daily. 08/02/22   Bradley Plump, MD  calcium-vitamin D (OSCAL WITH D) 500-200 MG-UNIT tablet Take 1 tablet by mouth every morning.    [provider]  cholecalciferol (VITAMIN D3) 25 MCG (1000 UNIT) tablet Take 1,000 Units by mouth every evening.    [provider]  diphenhydramine-acetaminophen (TYLENOL PM) 25-500 MG TABS tablet Take 1 tablet by mouth at bedtime as needed (sleep).    [provider]  FARXIGA 10 MG TABS tablet TAKE 1 TABLET DAILY BEFORE BREAKFAST Patient taking differently: Take 10 mg by mouth in the morning. 03/29/22   Bradley Plump, MD  furosemide (LASIX) 40 MG tablet TAKE 2 TABLETS EVERY MORNING AND 1 TABLET LATE IN THE AFTERNOON Patient taking differently: Take 40 mg by mouth in the morning. 02/26/22   [name], MD  guaiFENesin (MUCINEX) 600 MG 12 hr tablet Take 600 mg by mouth 2 (two) times daily.    [provider]  ipratropium-albuterol (DUONEB) 0.5-2.5 (3) MG/3ML SOLN Take 3 mLs by nebulization every 6 (six) hours as needed. Patient taking differently: Take 3 mLs by nebulization in the morning and at bedtime. 08/02/22   Bradley Plump, MD  Lancets [facility] ULTRASOFT) lancets Check blood sugars no more than twice daily 03/08/17   Bradley Plump, MD  Multiple Vitamins-Minerals (MULTIVITAMINS THER. W/MINERALS) TABS tablet Take 1 tablet by mouth daily. Patient taking  differently: Take 1 tablet by mouth in the morning. 09/14/13   Bradley Plump, MD  nitroGLYCERIN (NITROSTAT) 0.4 MG SL tablet Place 1 tablet (0.4 mg total) under the tongue every 5 (five) minutes x 3 doses as needed for chest pain. 09/07/21   Bradley Plump, MD  [facility] VERIO test strip USE TO CHECK BLOOD SUGAR NO MORE THAN TWICE A DAY 02/09/21   Bradley Plump, MD  pantoprazole (PROTONIX) 40 MG tablet Take 1 tablet (40 mg total) by mouth daily before breakfast. 09/01/22   Bradley Plump, MD  tamsulosin (FLOMAX) 0.4 MG CAPS capsule Take 1 capsule (0.4 mg total) by mouth daily after supper. 05/27/22   Bradley Plump, MD  zolpidem (AMBIEN) 10 MG tablet Take 10 mg by mouth at bedtime as needed for sleep.    [provider]    Physical Exam: Vitals:   10/01/22 1725 10/01/22 1730 10/01/22 1900 10/01/22 1920  BP: (!) 115/93 (!) 125/93 128/66   Pulse: 88 65 70   Resp: (!) 28 (!) 23 (!) 21   Temp:  97.9 F (36.6 C)    TempSrc:  Oral    SpO2: 92% (!) 82% 95%   Weight:    77.1 kg  Height:    5\' 9"  (1.753 m)    Constitutional: NAD, calm, comfortable Vitals:   10/01/22 1725 10/01/22 1730 10/01/22 1900 10/01/22 1920  BP: (!) 115/93 (!) 125/93 128/66   Pulse: 88 65 70   Resp: (!) 28 (!) 23 (!) 21   Temp:  97.9 F (36.6 C)    TempSrc:  Oral    SpO2: 92% (!) 82% 95%   Weight:    77.1 kg  Height:    5\' 9"  (1.753 m)   Eyes: PERRL, lids and conjunctivae normal ENMT: Mucous membranes are moist. Posterior pharynx clear of any exudate or lesions.Normal dentition.  Neck: normal, supple, no masses, no thyromegaly Respiratory: clear to auscultation bilaterally, no wheezing, no crackles. Normal respiratory effort. No accessory muscle  use.  Cardiovascular: Regular rate and rhythm, no murmurs / rubs / gallops. No extremity edema. 2+ pedal pulses. No carotid bruits.  Abdomen: no tenderness, no masses palpated. No hepatosplenomegaly. Bowel sounds positive.  Musculoskeletal: no clubbing / cyanosis. No joint  deformity upper and lower extremities. Good ROM, no contractures. Normal muscle tone.  Skin: no rashes, lesions, ulcers. No induration Neurologic: CN 2-12 grossly intact. Sensation intact, DTR normal. Strength 5/5 in all 4.  Psychiatric: Normal judgment and insight. Alert and oriented x 3. Normal mood.    Labs on Admission: I have personally reviewed following labs and imaging studies  CBC: Recent Labs  Lab 10/01/22 1255  WBC 9.8  NEUTROABS 7.5  HGB 14.2  HCT 43.4  MCV 96.4  PLT 198   Basic Metabolic Panel: Recent Labs  Lab 10/01/22 1428  NA 137  K 4.7  CL 102  CO2 24  GLUCOSE 141*  BUN 12  CREATININE 0.71  CALCIUM 8.5*   GFR: Estimated Creatinine Clearance: 72.4 mL/min (by C-G formula based on SCr of 0.71 mg/dL). Liver Function Tests: Recent Labs  Lab 10/01/22 1428  AST 37  ALT 15  ALKPHOS 75  BILITOT 1.4*  PROT 7.2  ALBUMIN 3.8   No results for input(s): "LIPASE", "AMYLASE" in the last 168 hours. No results for input(s): "AMMONIA" in the last 168 hours. Coagulation Profile: No results for input(s): "INR", "PROTIME" in the last 168 hours. Cardiac Enzymes: No results for input(s): "CKTOTAL", "CKMB", "CKMBINDEX", "TROPONINI" in the last 168 hours. BNP (last 3 results) No results for input(s): "PROBNP" in the last 8760 hours. HbA1C: No results for input(s): "HGBA1C" in the last 72 hours. CBG: No results for input(s): "GLUCAP" in the last 168 hours. Lipid Profile: No results for input(s): "CHOL", "HDL", "LDLCALC", "TRIG", "CHOLHDL", "LDLDIRECT" in the last 72 hours. Thyroid Function Tests: No results for input(s): "TSH", "T4TOTAL", "FREET4", "T3FREE", "THYROIDAB" in the last 72 hours. Anemia Panel: No results for input(s): "VITAMINB12", "FOLATE", "FERRITIN", "TIBC", "IRON", "RETICCTPCT" in the last 72 hours. Urine analysis:    Component Value Date/Time   COLORURINE YELLOW 07/15/2015 1440   APPEARANCEUR CLEAR 07/15/2015 1440   LABSPEC 1.015 07/15/2015  1440   PHURINE 6.0 07/15/2015 1440   GLUCOSEU NEGATIVE 07/15/2015 1440   HGBUR TRACE-LYSED (A) 07/15/2015 1440   BILIRUBINUR NEGATIVE 07/15/2015 1440   KETONESUR TRACE (A) 07/15/2015 1440   PROTEINUR NEGATIVE 07/06/2012 0341   UROBILINOGEN 1.0 07/15/2015 1440   NITRITE NEGATIVE 07/15/2015 1440   LEUKOCYTESUR NEGATIVE 07/15/2015 1440    Radiological Exams on Admission: DG Chest 2 View  Result Date: 10/01/2022 CLINICAL DATA:  Shortness of breath, cough EXAM: CHEST - 2 VIEW COMPARISON:  08/30/2022 FINDINGS: Cardiomegaly with left chest multi lead pacer. Diffuse bilateral interstitial opacity, similar to prior examinations. Disc degenerative disease of the thoracic spine. IMPRESSION: Cardiomegaly with diffuse bilateral interstitial opacity, similar to prior examinations, likely edema. No new or focal airspace opacity. Electronically Signed   By: Jearld Lesch M.D.   On: 10/01/2022 13:49   CUP PACEART REMOTE DEVICE CHECK  Result Date: 09/30/2022 Scheduled remote reviewed. Normal device function.  The device reached the elective replacement indicator on 08/28/2022, sent to triage There were 3 ventricular arrhythmias detected, 2 were short NSVT and one was greater than 20 beats and fell into the VT monitor only zone, sent to triage. No next remote scheduled. Hassell Halim, RN, CCDS, [employer]   EKG: Independently reviewed. 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  -1st + 08/23/22 -to be complete will check covid disease activity labs   Hx  of CAD  NSTEMI   -presumed Type II -however can r/o Type 1  -EKG paced  - per cardiology start on heparin drip  -resume home  asa , statin , bb  -f/u cardiology  recs  -  CHFpef -possible mild exacerbation , however  finding on cxr may be due to prior covid infection  As current cxr unchanged from prior  -BNP  in 200's  -s/p lasix in ED -redose based on f/u imaging or progressive symptoms  Anemia -at baselin e  Chronic Atrial Fib  -s/p AV nodal ablation , biventricular pacemaker -no active issues currently   Hx of Pulmonary Hypertension   Hypertension  -stable resume home regimen as able    Hx of ICH -no active issues   DMII - last 1c  - place on iss/fs   BPH -continue flomax     DVT prophylaxis: heparin Code Status: full Family Communication: none at bedside Disposition Plan: patient  expected to be admitted greater than 2 midnights  Consults called:  Cardiology [place] Admission status: progressive care   Lurline Del MD [facility]   If 7PM-7AM, please contact night-coverage [email or web address] Password [facility]  10/01/2022, 7:43 PM

## 2022-10-01 NOTE — ED Provider Notes (Signed)
ADDENDUM  Pt handoff from day shift PA Sherian Maroon pending cardiology consult and admission to hospital. Presenting with one week of dyspnea on exertion with findings positive for heart failure exacerbation and COVID. Troponin elevated from baseline with initial 577 and repeat 685 so consult to cardiology needed to see if further intervention or transfer required. Did touch base with cardiology who reviewed previous heart caths that were unremarkable and recommended 24-48 hours of heparin to cover for possible Type 2 NSTEMI but otherwise it is ok to keep pt at [facility] for hospitalization and further management and cardiology will see in hospital tomorrow.   ED Course / MDM   Clinical Course as of 10/01/22 1550  Fri Oct 01, 2022  1452 Consulted cardiology regarding the patient.  Agreed with admission to hospital medicine with consultation in the hospital. [CR]  1516 Troponin 685 [CR]    Clinical Course User Index [CR] Peter Garter, PA   Medical Decision Making Amount and/or Complexity of Data Reviewed Labs: ordered. Radiology: ordered.  Risk Prescription drug management. Decision regarding hospitalization.   Pt requiring admission for heart failure exacerbation. He is also COVID positive with suspected Type 2 NSTEMI. Cardiology consulted due to bump in troponin from 577 to 685. No STEMI on EKG and pt currently chest pain free so will give heparin and admit to [facility] with cardiology seeing in consult. Lasix ordered by day time PA for fluid overload management. Pt remained stable while under my care and hospitalist accepted admission.        Richardson Dopp 10/01/22 1948    Arby Barrette, MD 10/13/22 [id number]

## 2022-10-01 NOTE — ED Provider Notes (Signed)
[facility]-EMERGENCY DEPT Provider Note   CSN: [id number] Arrival date & time: 10/01/22  1220     History  Chief Complaint  Patient presents with   Shortness of Breath    Bradley KNECHTEL Sr. is a 81 y.o. male.   Shortness of Breath   81 year old male presents emergency department with complaints of shortness of breath, chest pain, cough.  Patient reports history of CHF, COPD which he feels like he is retaining more fluid than usual.  Reports being on no fluid restriction at home but has been compliant with food diet.  Reports increased worsening shortness of breath that is worsened when lying down flat at night as well as with physical exertion.  Reports some chest discomfort when he is lying flat at night.  He also reports cough which is worsened over the past week.  States he has experienced objective fever and chills at home as well.  Reports being compliant with his at home medicines.  Received albuterol, Atrovent, Solu-Medrol via EMS prior to arrival of which he states helped his symptoms some.  Denies abdominal pain, nausea, vomiting, urinary symptoms, change in bowel habits.  Patiently recently admitted x 2 last month for hypoxia/COPD exacerbation.  Patient on 4 L chronically at baseline.  Past medical history significant for CHF, CAD, COPD, hypertension, hyperlipidemia, pacemaker placement, pulmonary hypertension, peripheral vascular disease, atrial fibrillation on no anticoagulation,  Home Medications Prior to Admission medications   Medication Sig Start Date End Date Taking? Authorizing Provider  acetaminophen (TYLENOL) 500 MG tablet Take 1,000 mg by mouth every 8 (eight) hours as needed for headache or moderate pain.    [provider]  albuterol (VENTOLIN HFA) 108 (90 Base) MCG/ACT inhaler Inhale 2 puffs into the lungs every 6 (six) hours as needed for wheezing or shortness of breath. Patient taking differently: Inhale 1 puff into the lungs 2  (two) times daily. 03/11/21   Ghimire, Werner Lean, MD  amLODipine (NORVASC) 10 MG tablet Take 1 tablet (10 mg total) by mouth daily. Patient taking differently: Take 10 mg by mouth in the morning. 06/07/22   [name], MD  aspirin 81 MG tablet Take 81 mg by mouth every morning.     [provider]  atorvastatin (LIPITOR) 20 MG tablet Take 1 tablet (20 mg total) by mouth at bedtime. Patient taking differently: Take 20 mg by mouth every evening. 06/07/22   [name], MD  bisoprolol (ZEBETA) 10 MG tablet Take 1 tablet (10 mg total) by mouth daily. 08/26/22   [name], MD  budesonide (PULMICORT) 0.5 MG/2ML nebulizer solution Take 2 mLs (0.5 mg total) by nebulization 2 (two) times daily. 08/02/22   [name], MD  calcium-vitamin D (OSCAL WITH D) 500-200 MG-UNIT tablet Take 1 tablet by mouth every morning.    [provider]  cholecalciferol (VITAMIN D3) 25 MCG (1000 UNIT) tablet Take 1,000 Units by mouth every evening.    [provider]  diphenhydramine-acetaminophen (TYLENOL PM) 25-500 MG TABS tablet Take 1 tablet by mouth at bedtime as needed (sleep).    [provider]  FARXIGA 10 MG TABS tablet TAKE 1 TABLET DAILY BEFORE BREAKFAST Patient taking differently: Take 10 mg by mouth in the morning. 03/29/22   [name], MD  furosemide (LASIX) 40 MG tablet TAKE 2 TABLETS EVERY MORNING AND 1 TABLET LATE IN THE AFTERNOON Patient taking differently: Take 40 mg by mouth in the morning. 02/26/22   Duke Salvia,  Tiffany, MD  guaiFENesin (MUCINEX) 600 MG 12 hr tablet Take 600 mg by mouth 2 (two) times daily.    [provider]  ipratropium-albuterol (DUONEB) 0.5-2.5 (3) MG/3ML SOLN Take 3 mLs by nebulization every 6 (six) hours as needed. Patient taking differently: Take 3 mLs by nebulization in the morning and at bedtime. 08/02/22   [name], MD  Lancets [facility] ULTRASOFT) lancets Check blood sugars no more than twice daily 03/08/17   [name], MD  Multiple  Vitamins-Minerals (MULTIVITAMINS THER. W/MINERALS) TABS tablet Take 1 tablet by mouth daily. Patient taking differently: Take 1 tablet by mouth in the morning. 09/14/13   [name], MD  nitroGLYCERIN (NITROSTAT) 0.4 MG SL tablet Place 1 tablet (0.4 mg total) under the tongue every 5 (five) minutes x 3 doses as needed for chest pain. 09/07/21   [name], MD  [facility] VERIO test strip USE TO CHECK BLOOD SUGAR NO MORE THAN TWICE A DAY 02/09/21   [name], MD  pantoprazole (PROTONIX) 40 MG tablet Take 1 tablet (40 mg total) by mouth daily before breakfast. 09/01/22   [name], MD  tamsulosin (FLOMAX) 0.4 MG CAPS capsule Take 1 capsule (0.4 mg total) by mouth daily after supper. 05/27/22   [name], MD  zolpidem (AMBIEN) 10 MG tablet Take 10 mg by mouth at bedtime as needed for sleep.    [provider]      Allergies    Hydrocodone, Ultram [tramadol], Cialis [tadalafil], and Avelox [moxifloxacin hydrochloride]    Review of Systems   Review of Systems  Respiratory:  Positive for shortness of breath.   All other systems reviewed and are negative.   Physical Exam Updated Vital Signs BP (!) 115/93   Pulse 88   Temp 97.9 F (36.6 C) (Oral)   Resp (!) 28   SpO2 92%  Physical Exam Vitals and nursing note reviewed.  Constitutional:      General: He is not in acute distress.    Appearance: He is well-developed.  HENT:     Head: Normocephalic and atraumatic.  Eyes:     Conjunctiva/sclera: Conjunctivae normal.  Cardiovascular:     Rate and Rhythm: Normal rate and regular rhythm.     Heart sounds: No murmur heard. Pulmonary:     Effort: Pulmonary effort is normal. No respiratory distress.     Breath sounds: Wheezing and rales present.     Comments: Wheezes Rales auscultated bilateral lung fields. Abdominal:     Palpations: Abdomen is soft.     Tenderness: There is no abdominal tenderness.  Musculoskeletal:        General: No swelling.     Cervical back: Neck supple.      Right lower leg: Edema present.     Left lower leg: Edema present.     Comments: 1+ bilateral lower extremity pitting edema.  Skin:    General: Skin is warm and dry.     Capillary Refill: Capillary refill takes less than 2 seconds.  Neurological:     Mental Status: He is alert.  Psychiatric:        Mood and Affect: Mood normal.     ED Results / Procedures / Treatments   Labs (all labs ordered are listed, but only abnormal results are displayed) Labs Reviewed  RESP PANEL BY RT-PCR (RSV, FLU A&B, COVID)  RVPGX2 - Abnormal; Notable for the following components:      Result Value  SARS Coronavirus 2 by RT PCR POSITIVE (*)    All other components within normal limits  BRAIN NATRIURETIC PEPTIDE - Abnormal; Notable for the following components:   B Natriuretic Peptide 188.5 (*)    All other components within normal limits  COMPREHENSIVE METABOLIC PANEL - Abnormal; Notable for the following components:   Glucose, Bld 141 (*)    Calcium 8.5 (*)    Total Bilirubin 1.4 (*)    All other components within normal limits  TROPONIN I (HIGH SENSITIVITY) - Abnormal; Notable for the following components:   Troponin I (High Sensitivity) 577 (*)    All other components within normal limits  TROPONIN I (HIGH SENSITIVITY) - Abnormal; Notable for the following components:   Troponin I (High Sensitivity) 685 (*)    All other components within normal limits  CBC WITH DIFFERENTIAL/PLATELET    EKG EKG Interpretation  Date/Time:  Friday October 01 2022 13:14:31 EST Ventricular Rate:  78 PR Interval:  73 QRS Duration: 115 QT Interval:  402 QTC Calculation: 458 R Axis:   -80 Text Interpretation: Ventricular-paced rhythm No further analysis attempted due to paced rhythm Confirmed by Vanetta Mulders [phone]) on 10/01/2022 2:21:07 PM  Radiology DG Chest 2 View  Result Date: 10/01/2022 CLINICAL DATA:  Shortness of breath, cough EXAM: CHEST - 2 VIEW COMPARISON:  08/30/2022 FINDINGS:  Cardiomegaly with left chest multi lead pacer. Diffuse bilateral interstitial opacity, similar to prior examinations. Disc degenerative disease of the thoracic spine. IMPRESSION: Cardiomegaly with diffuse bilateral interstitial opacity, similar to prior examinations, likely edema. No new or focal airspace opacity. Electronically Signed   By: Jearld Lesch M.D.   On: 10/01/2022 13:49   CUP PACEART REMOTE DEVICE CHECK  Result Date: 09/30/2022 Scheduled remote reviewed. Normal device function.  The device reached the elective replacement indicator on 08/28/2022, sent to triage There were 3 ventricular arrhythmias detected, 2 were short NSVT and one was greater than 20 beats and fell into the VT monitor only zone, sent to triage. No next remote scheduled. Hassell Halim, RN, CCDS, [employer]   Procedures Procedures    Medications Ordered in ED Medications  furosemide (LASIX) injection 40 mg (40 mg Intravenous Given 10/01/22 1626)    ED Course/ Medical Decision Making/ A&P Clinical Course as of 10/01/22 1733  Fri Oct 01, 2022  1452 Consulted cardiology regarding the patient.  Agreed with admission to hospital medicine with consultation in the hospital. [CR]  1516 Troponin 685 [CR]    Clinical Course User Index [CR] Peter Garter, PA                           Medical Decision Making Amount and/or Complexity of Data Reviewed Labs: ordered. Radiology: ordered.  Risk Prescription drug management. Decision regarding hospitalization.   This patient presents to the ED for concern of shortness of breath, this involves an extensive number of treatment options, and is a complaint that carries with it a high risk of complications and morbidity.  The differential diagnosis includes The causes for shortness of breath include but are not limited to Cardiac (AHF, pericardial effusion and tamponade, arrhythmias, ischemia, etc) Respiratory (COPD, asthma, pneumonia, pneumothorax, primary  pulmonary hypertension, PE/VQ mismatch) Hematological (anemia)  Co morbidities that complicate the patient evaluation  See HPI   Additional history obtained:  Additional history obtained from EMR External records from outside source obtained and reviewed including hospital records   Lab Tests:  I Ordered,  and personally interpreted labs.  The pertinent results include:  no leukocytosis. No evidence of anemia. No platelet abnormalities. No electrolyte abnormalities. No transaminates. No renal dysfunction. Initial troponin of 577 with repeat 685; consultation to cardiology conducted after initial troponin and pending second upon shift change. BNP elevated of 188. Resp viral panel positive for COVID.    Imaging Studies ordered:  I ordered imaging studies including Chest x-ray  I independently visualized and interpreted imaging which showed Cardiomegaly with diffuse bilateral interstitial opacity. No new or focal airspace opacity.  I agree with the radiologist interpretation  Cardiac Monitoring: / EKG:  The patient was maintained on a cardiac monitor.  I personally viewed and interpreted the cardiac monitored which showed an underlying rhythm of: V-pace rhythm w/ normal rate   Consultations Obtained:  See ED course   Problem List / ED Course / Critical interventions / Medication management  Acute on chronic CHF exacerbation/COVID/Elevated troponin I ordered medication including lasix    Reevaluation of the patient after these medicines showed that the patient improved I have reviewed the patients home medicines and have made adjustments as needed   Social Determinants of Health:  Former cigarette use. Denies illicit drug use   Test / Admission - Considered:  Acute on chronic CHF exacerbation/COVID/Elevated troponin Vitals signs significant for intermittently tachypneic. Otherwise within normal range and stable throughout visit. Laboratory/imaging studies significant  for: see above Patient with multiple etiologies contributing to his shortness of breath including Acute on chronic CHF exacerbation and COVID. Patient deemed to meet admission criteria given multifactoral causes along with elevated troponin. Cardiology was consulted regarding the patient given evidence of elevated troponin. Awaiting hospitalist consultation for admission. At shift change, patient care handed of to PA-C Gow[place]. Patient stable upon shift change.         Final Clinical Impression(s) / ED Diagnoses Final diagnoses:  Acute on chronic congestive heart failure, unspecified heart failure type (HCC)  COVID  Elevated troponin    Rx / DC Orders ED Discharge Orders     None         Peter Garter, [place] 10/01/22 1733    Vanetta Mulders, MD 10/04/22 1710

## 2022-10-01 NOTE — Progress Notes (Signed)
ANTICOAGULATION CONSULT NOTE - Initial Consult  Pharmacy Consult for IV heparin Indication: chest pain/ACS  Allergies  Allergen Reactions   Hydrocodone Other (See Comments)    "given to him in the hospital; went thru withdrawals once home; dr said not to take it again" (09/27/2012) dizziness   Ultram [Tramadol] Other (See Comments)    "given to him in the hospital; went thru withdrawals once home; dr said not to take it again" (09/27/2012) dizziness   Cialis [Tadalafil] Other (See Comments)    Headache Backache  Dizziness    Avelox [Moxifloxacin Hydrochloride] Itching and Other (See Comments)    Headache Dizziness    Patient Measurements: Height: 5\' 9"  (175.3 cm) Weight: 77.1 kg (170 lb) IBW/kg (Calculated) : 70.7 Heparin Dosing Weight: TBW  Vital Signs: Temp: 97.9 F (36.6 C) (12/22 1730) Temp Source: Oral (12/22 1730) BP: 128/66 (12/22 1900) Pulse Rate: 70 (12/22 1900)  Labs: Recent Labs    10/01/22 1255 10/01/22 1428  HGB 14.2  --   HCT 43.4  --   PLT 198  --   CREATININE  --  0.71  TROPONINIHS 577* 685*    Estimated Creatinine Clearance: 72.4 mL/min (by C-G formula based on SCr of 0.71 mg/dL).   Medical History: Past Medical History:  Diagnosis Date   Abnormal CT scan, chest 2012   CT chest, several lymphadenopathies. Sees pulmonary   Anemia    intermittent   Arthritis    "?back" (09/19/2018)   Atrial fibrillation (HCC)    s/p AV node ablation & BiV PPM implantation 09/08/11 (op dictation pending)   BPH (benign prostatic hyperplasia)    Saw Dr 09/10/11 2004, normal renal u/s   Bronchitis 08/26/2017   CAD (coronary artery disease)    CHF (congestive heart failure) (HCC)    Thought primarily to be non-systolic although EF down (EF 08/28/2017 12/2010, down to 35-40% 09/05/11), cath 2008 with no CAD, nuclear study 07/2011 showing Small area of reversibility in the distal ant/lat wall the left ventricle suspicious for ischemia/septal wall HK but felt to be  low risk  (per D/C Summary 07/2011)   Chronic bronchitis (HCC)    "get it ~ q yr"   Chronic diastolic heart failure (HCC)         Chronic respiratory failure with hypoxia and hypercapnia (HCC) 09/14/2010   Followed in Pulmonary clinic/ [facility]/ [facility]  Started on 02 2lpm at discharge 09/23/11   - PFT's 10/28/2011  FEV1  1.40 (51%)  with ratio 70 and no better p B2 and DLCO 53 corrects to 101    - PFT's 10/18/2014    FEV1 1.71 (59%) with ratio 68 and no sign change p B2 and DLCO 53 corrects to 85  -  HC03   07/28/20  = 33  -  HCO3   03/17/21     = 31     Heart murmur    HLD (hyperlipidemia)    HTN (hypertension)    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   Pleural effusion 2008   S/p decortication   Pneumonia 08/02/2011   Pulmonary HTN (HCC)    per cath 2008   Type II diabetes mellitus (HCC) 1999    Medications:  (Not in a hospital  admission)  Scheduled:   heparin  4,000 Units Intravenous Once   PRN:   Assessment: 71 yoM with PMH CAD, Afib with pacemaker but not on anticoagulation, PVD, Pulm HTN, CHF, COPD on 4L O2 at baseline, presenting 12/22 with SOB, CP, cough. Found to have elevated troponins; Cardiology recommending short term heparin infusion per Pharmacy.  Baseline INR, aPTT: not done Prior anticoagulation: none  Significant events:  Today, 10/01/2022: CBC: WNL SCr stable & at baseline No bleeding or infusion issues per nursing  Goal of Therapy: Heparin level 0.3-0.7 units/ml Monitor platelets by anticoagulation protocol: Yes  Plan: Heparin 4000 units IV bolus x 1 Heparin 950 units/hr IV infusion Check heparin level 8 hrs after start Daily CBC, daily heparin level once stable Monitor for signs of bleeding or thrombosis  Bernadene Person, PharmD, BCPS [phone] 10/01/2022,  7:30 PM

## 2022-10-02 ENCOUNTER — Inpatient Hospital Stay (HOSPITAL_COMMUNITY): Payer: Medicare Other

## 2022-10-02 ENCOUNTER — Other Ambulatory Visit: Payer: Self-pay

## 2022-10-02 DIAGNOSIS — Z950 Presence of cardiac pacemaker: Secondary | ICD-10-CM | POA: Diagnosis not present

## 2022-10-02 DIAGNOSIS — I509 Heart failure, unspecified: Secondary | ICD-10-CM | POA: Diagnosis not present

## 2022-10-02 DIAGNOSIS — R7989 Other specified abnormal findings of blood chemistry: Secondary | ICD-10-CM | POA: Diagnosis not present

## 2022-10-02 DIAGNOSIS — J1282 Pneumonia due to coronavirus disease 2019: Secondary | ICD-10-CM | POA: Diagnosis not present

## 2022-10-02 DIAGNOSIS — I482 Chronic atrial fibrillation, unspecified: Secondary | ICD-10-CM

## 2022-10-02 DIAGNOSIS — U071 COVID-19: Secondary | ICD-10-CM | POA: Diagnosis not present

## 2022-10-02 LAB — D-DIMER, QUANTITATIVE: D-Dimer, Quant: 0.45 ug/mL-FEU (ref 0.00–0.50)

## 2022-10-02 LAB — COMPREHENSIVE METABOLIC PANEL
ALT: 17 U/L (ref 0–44)
AST: 24 U/L (ref 15–41)
Albumin: 3.7 g/dL (ref 3.5–5.0)
Alkaline Phosphatase: 64 U/L (ref 38–126)
Anion gap: 11 (ref 5–15)
BUN: 19 mg/dL (ref 8–23)
CO2: 27 mmol/L (ref 22–32)
Calcium: 8.9 mg/dL (ref 8.9–10.3)
Chloride: 103 mmol/L (ref 98–111)
Creatinine, Ser: 0.85 mg/dL (ref 0.61–1.24)
GFR, Estimated: >60 mL/min (ref >60)
Glucose, Bld: 164 mg/dL — ABNORMAL HIGH (ref 70–99)
Potassium: 4.2 mmol/L (ref 3.5–5.1)
Sodium: 141 mmol/L (ref 135–145)
Total Bilirubin: 1 mg/dL (ref 0.3–1.2)
Total Protein: 6.9 g/dL (ref 6.5–8.1)

## 2022-10-02 LAB — MRSA NEXT GEN BY PCR, NASAL: MRSA by PCR Next Gen: NOT DETECTED

## 2022-10-02 LAB — CBC
HCT: 39.4 % (ref 39.0–52.0)
Hemoglobin: 13.3 g/dL (ref 13.0–17.0)
MCH: 32 pg (ref 26.0–34.0)
MCHC: 33.8 g/dL (ref 30.0–36.0)
MCV: 94.9 fL (ref 80.0–100.0)
Platelets: 223 10*3/uL (ref 150–400)
RBC: 4.15 MIL/uL — ABNORMAL LOW (ref 4.22–5.81)
RDW: 15 % (ref 11.5–15.5)
WBC: 9.9 10*3/uL (ref 4.0–10.5)
nRBC: 0 % (ref 0.0–0.2)

## 2022-10-02 LAB — TROPONIN I (HIGH SENSITIVITY)
Troponin I (High Sensitivity): 218 ng/L (ref <18)
Troponin I (High Sensitivity): 216 ng/L (ref <18)

## 2022-10-02 LAB — HEPARIN LEVEL (UNFRACTIONATED)
Heparin Unfractionated: 0.14 IU/mL — ABNORMAL LOW (ref 0.30–0.70)
Heparin Unfractionated: <0.1 IU/mL — ABNORMAL LOW (ref 0.30–0.70)

## 2022-10-02 LAB — EXPECTORATED SPUTUM ASSESSMENT W GRAM STAIN, RFLX TO RESP C

## 2022-10-02 LAB — C-REACTIVE PROTEIN: CRP: 4.1 mg/dL — ABNORMAL HIGH (ref <1.0)

## 2022-10-02 LAB — GLUCOSE, CAPILLARY: Glucose-Capillary: 162 mg/dL — ABNORMAL HIGH (ref 70–99)

## 2022-10-02 LAB — PROCALCITONIN: Procalcitonin: <0.1 ng/mL

## 2022-10-02 MED ORDER — IOHEXOL 350 MG/ML SOLN
100.0000 mL | Freq: Once | INTRAVENOUS | Status: AC | PRN
  Administered 2022-10-02: 100 mL via INTRAVENOUS

## 2022-10-02 MED ORDER — HEPARIN BOLUS VIA INFUSION
2000.0000 [IU] | Freq: Once | INTRAVENOUS | Status: AC
  Administered 2022-10-02: 2000 [IU] via INTRAVENOUS
  Filled 2022-10-02: qty 2000

## 2022-10-02 MED ORDER — HEPARIN BOLUS VIA INFUSION
2300.0000 [IU] | Freq: Once | INTRAVENOUS | Status: AC
  Administered 2022-10-02: 2300 [IU] via INTRAVENOUS
  Filled 2022-10-02: qty 2300

## 2022-10-02 NOTE — Progress Notes (Signed)
[facility]                                                                               Bradley [name], is a 81 y.o. male, DOB - July 06, 1941, PPJ:[id number] Admit date - 10/01/2022    Outpatient Primary MD for the patient is Bradley Plump, MD  LOS - 1  days    Brief summary   Bradley [name] Sr. is a 81 y.o. male with medical history significant of  history of anemia, osteoarthritis, chronic atrial fibrillation status post AV nodal ablation and biventricular pacemaker, BPH, bronchitis, CAD, chronic diastolic heart failure on 5L O2at home,pulmonary hypertension, COPD, chronic respiratory failure, hyperlipidemia, hypertension, diabetes mellitus type 2, intracranial bleed, migraine headache, insomnia, pleural effusions.  Patient also has interim history of admission  08/27/22-08/30/22 with discharge diagnosis of CAP/COPD exacerbation  in setting of COVID infection diagnosed 08/23/22. Patient was treated  antibiotics  as well as steroids for COPD exacerbation with good response and was discharged home.  Patient now return to ED close to one month later with complaints of progressive DOE over the last 1 week associated with central chest discomfort improved with rest.    Assessment & Plan    Assessment and Plan:  Acute COPD exacerbation with acute on chronic hypoxic respiratory failure  Improving, wheezing.  Continue with prednisone taper. Continue with rocephin .  Continue with duonebs.     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  Demand ischemia from CHF.  Check ECHO. Cardiology on board.    Anemia of chronic disease:  Continue to monitor.    Chronic Atrial Fib  -s/p AV nodal ablation , biventricular pacemaker -no active issues currently    Hx of Pulmonary Hypertension  -no active issue    Hypertension  Well controlled.      Hx of ICH -no active issues    DMII CBG (last  3)  Recent Labs    10/02/22 0743  GLUCAP 162*   Resume SSI.    BPH -continue flomax    Hyperlipidemia:  Resume statin.    Estimated body mass index is 25.1 kg/m as calculated from the following:   Height as of this encounter: 5\' 9"  (1.753 m).   Weight as of this encounter: 77.1 kg.  Code Status: full code.  DVT Prophylaxis:     Level of Care: Level of care: Progressive Family Communication: none at bedside.   Disposition Plan:     echo, further work up pending.   Procedures:  None.   Consultants:   Cardiology.   Antimicrobials:   Anti-infectives (From admission, onward)    Start     Dose/Rate Route Frequency Ordered Stop   10/01/22 2045  cefTRIAXone (ROCEPHIN) 1 g in sodium chloride 0.9 % 100 mL IVPB        1 g 200 mL/hr over 30 Minutes Intravenous Daily 10/01/22 2022 10/06/22 0959        Medications  Scheduled Meds:  aspirin EC  81 mg Oral q morning   atorvastatin  20 mg Oral QPM  ipratropium-albuterol  3 mL Nebulization BID   methylPREDNISolone (SOLU-MEDROL) injection  40 mg Intravenous Q12H   Followed by   Melene Muller ON 10/03/2022] predniSONE  40 mg Oral Q breakfast   Continuous Infusions:  cefTRIAXone (ROCEPHIN)  IV 1 g (10/02/22 0929)   heparin 1,200 Units/hr (10/02/22 0628)   PRN Meds:.acetaminophen, iohexol, levalbuterol, ondansetron **OR** ondansetron (ZOFRAN) IV    Subjective:   Bradley [name] was seen and examined today.  No chest pain. Sob improving.   Objective:   Vitals:   10/02/22 0115 10/02/22 0537 10/02/22 0807 10/02/22 0900  BP: (!) 103/58 (!) 131/96  119/71  Pulse: 74 72  71  Resp: 18 (!) 22 17   Temp: 98.4 F (36.9 C) 98 F (36.7 C)  (!) 97.4 F (36.3 C)  TempSrc: Oral Oral  Oral  SpO2: 94% 94% 94% 94%  Weight:      Height:        Intake/Output Summary (Last 24 hours) at 10/02/2022 0935 Last data filed at 10/02/2022 0300 Gross per 24 hour  Intake 480 ml  Output 1250 ml  Net -770 ml   Filed Weights   10/01/22  1920  Weight: 77.1 kg     Exam General exam: Appears calm and comfortable  Respiratory system: Clear to auscultation. Respiratory effort normal. Cardiovascular system: S1 & S2 heard, RRR. No JVD, Gastrointestinal system: Abdomen is nondistended, soft and nontender. Central nervous system: Alert and oriented. No focal neurological deficits. Extremities: Symmetric 5 x 5 power. Skin: No rashes, lesions or ulcers Psychiatry:  Mood & affect appropriate.     Data Reviewed:  I have personally reviewed following labs and imaging studies   CBC Lab Results  Component Value Date   WBC 9.9 10/02/2022   RBC 4.15 (L) 10/02/2022   HGB 13.3 10/02/2022   HCT 39.4 10/02/2022   MCV 94.9 10/02/2022   MCH 32.0 10/02/2022   PLT 223 10/02/2022   MCHC 33.8 10/02/2022   RDW 15.0 10/02/2022   LYMPHSABS 1.7 10/01/2022   MONOABS 0.6 10/01/2022   EOSABS 0.0 10/01/2022   BASOSABS 0.0 10/01/2022     Last metabolic panel Lab Results  Component Value Date   NA 141 10/02/2022   K 4.2 10/02/2022   CL 103 10/02/2022   CO2 27 10/02/2022   BUN 19 10/02/2022   CREATININE 0.85 10/02/2022   GLUCOSE 164 (H) 10/02/2022   GFRNONAA >60 10/02/2022   GFRAA >60 09/18/2019   CALCIUM 8.9 10/02/2022   PHOS 3.3 08/29/2022   PROT 6.9 10/02/2022   ALBUMIN 3.7 10/02/2022   BILITOT 1.0 10/02/2022   ALKPHOS 64 10/02/2022   AST 24 10/02/2022   ALT 17 10/02/2022   ANIONGAP 11 10/02/2022    CBG (last 3)  Recent Labs    10/02/22 0743  GLUCAP 162*      Coagulation Profile: No results for input(s): "INR", "PROTIME" in the last 168 hours.   Radiology Studies: DG Chest 2 View  Result Date: 10/01/2022 CLINICAL DATA:  Shortness of breath, cough EXAM: CHEST - 2 VIEW COMPARISON:  08/30/2022 FINDINGS: Cardiomegaly with left chest multi lead pacer. Diffuse bilateral interstitial opacity, similar to prior examinations. Disc degenerative disease of the thoracic spine. IMPRESSION: Cardiomegaly with diffuse  bilateral interstitial opacity, similar to prior examinations, likely edema. No new or focal airspace opacity. Electronically Signed   By: Jearld Lesch M.D.   On: 10/01/2022 13:49       Kathlen Mody M.D. [facility] 10/02/2022, 9:35 AM  Available  via Epic secure chat 7am-7pm After 7 pm, please refer to night coverage provider listed on amion.

## 2022-10-02 NOTE — Progress Notes (Signed)
ANTICOAGULATION CONSULT NOTE - Follow up [facility] for IV heparin Indication: chest pain/ACS  Allergies  Allergen Reactions   Hydrocodone Other (See Comments)    "given to him in the hospital; went thru withdrawals once home; dr said not to take it again" (09/27/2012) dizziness   Ultram [Tramadol] Other (See Comments)    "given to him in the hospital; went thru withdrawals once home; dr said not to take it again" (09/27/2012) dizziness   Cialis [Tadalafil] Other (See Comments)    Headache Backache  Dizziness    Avelox [Moxifloxacin Hydrochloride] Itching and Other (See Comments)    Headache Dizziness    Patient Measurements: Height: 5\' 9"  (175.3 cm) Weight: 77.1 kg (170 lb) IBW/kg (Calculated) : 70.7 Heparin Dosing Weight: TBW  Vital Signs: Temp: 98.6 F (37 C) (12/23 1329) Temp Source: Oral (12/23 1329) BP: 123/67 (12/23 1329) Pulse Rate: 72 (12/23 1329)  Labs: Recent Labs    10/01/22 1255 10/01/22 1428 10/02/22 0542 10/02/22 0851 10/02/22 1043 10/02/22 1442  HGB 14.2  --  13.3  --   --   --   HCT 43.4  --  39.4  --   --   --   PLT 198  --  223  --   --   --   HEPARINUNFRC  --   --  0.14*  --   --  <0.10*  CREATININE  --  0.71 0.85  --   --   --   TROPONINIHS 577* 685*  --  218* 216*  --      Estimated Creatinine Clearance: 68.2 mL/min (by C-G formula based on SCr of 0.85 mg/dL).   Medical History: Past Medical History:  Diagnosis Date   Abnormal CT scan, chest 2012   CT chest, several lymphadenopathies. Sees pulmonary   Anemia    intermittent   Arthritis    "?back" (09/19/2018)   Atrial fibrillation (HCC)    s/p AV node ablation & BiV PPM implantation 09/08/11 (op dictation pending)   BPH (benign prostatic hyperplasia)    Saw Dr Reece Agar 2004, normal renal u/s   Bronchitis 08/26/2017   CAD (coronary artery disease)    CHF (congestive heart failure) ([place])    Thought primarily to be non-systolic although EF down (EF 45-50%  12/2010, down to 35-40% 09/05/11), cath 2008 with no CAD, nuclear study 07/2011 showing Small area of reversibility in the distal ant/lat wall the left ventricle suspicious for ischemia/septal wall HK but felt to be low risk  (per D/C Summary 07/2011)   Chronic bronchitis ([place])    "get it ~ q yr"   Chronic diastolic heart failure ([place])         Chronic respiratory failure with hypoxia and hypercapnia ([place]) 09/14/2010   Followed in Pulmonary clinic/ [facility]/ [facility]  Started on 02 2lpm at discharge 09/23/11   - PFT's 10/28/2011  FEV1  1.40 (51%)  with ratio 70 and no better p B2 and DLCO 53 corrects to 101    - PFT's 10/18/2014    FEV1 1.71 (59%) with ratio 68 and no sign change p B2 and DLCO 53 corrects to 85  -  HC03   07/28/20  = 33  -  HCO3   03/17/21     = 31     Heart murmur    HLD (hyperlipidemia)    HTN (hypertension)    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   Pleural effusion 2008   S/p decortication   Pneumonia 08/02/2011   Pulmonary HTN ([place])    per cath 2008   Type II diabetes mellitus ([place]) 1999    Medications:  Medications Prior to Admission  Medication Sig Dispense Refill Last Dose   acetaminophen (TYLENOL) 500 MG tablet Take 1,000 mg by mouth every 8 (eight) hours as needed for headache or moderate pain.      albuterol (VENTOLIN HFA) 108 (90 Base) MCG/ACT inhaler Inhale 2 puffs into the lungs every 6 (six) hours as needed for wheezing or shortness of breath. (Patient taking differently: Inhale 1 puff into the lungs 2 (two) times daily.) 8.5 g 0    amLODipine (NORVASC) 10 MG tablet Take 1 tablet (10 mg total) by mouth daily. (Patient taking differently: Take 10 mg by mouth in the morning.) 90 tablet 1    aspirin 81 MG tablet Take 81 mg by mouth every morning.       atorvastatin (LIPITOR) 20  MG tablet Take 1 tablet (20 mg total) by mouth at bedtime. (Patient taking differently: Take 20 mg by mouth every evening.) 90 tablet 1    bisoprolol (ZEBETA) 10 MG tablet Take 1 tablet (10 mg total) by mouth daily. 90 tablet 0    budesonide (PULMICORT) 0.5 MG/2ML nebulizer solution Take 2 mLs (0.5 mg total) by nebulization 2 (two) times daily. 360 mL 1    calcium-vitamin D (OSCAL WITH D) 500-200 MG-UNIT tablet Take 1 tablet by mouth every morning.      cholecalciferol (VITAMIN D3) 25 MCG (1000 UNIT) tablet Take 1,000 Units by mouth every evening.      diphenhydramine-acetaminophen (TYLENOL PM) 25-500 MG TABS tablet Take 1 tablet by mouth at bedtime as needed (sleep).      FARXIGA 10 MG TABS tablet TAKE 1 TABLET DAILY BEFORE BREAKFAST (Patient taking differently: Take 10 mg by mouth in the morning.) 90 tablet 3    furosemide (LASIX) 40 MG tablet TAKE 2 TABLETS EVERY MORNING AND 1 TABLET LATE IN THE AFTERNOON (Patient taking differently: Take 40 mg by mouth in the morning.) 270 tablet 3    guaiFENesin (MUCINEX) 600 MG 12 hr tablet Take 600 mg by mouth 2 (two) times daily.      ipratropium-albuterol (DUONEB) 0.5-2.5 (3) MG/3ML SOLN Take 3 mLs by nebulization every 6 (six) hours as needed. (Patient taking differently: Take 3 mLs by nebulization in the morning and at bedtime.) 360 mL 1    Lancets (ONETOUCH ULTRASOFT) lancets Check blood sugars no more than twice daily 200 each 12    Multiple Vitamins-Minerals (MULTIVITAMINS THER. W/MINERALS) TABS tablet Take 1 tablet by mouth daily. (Patient taking differently: Take 1 tablet by mouth in the morning.) 30 each 5    nitroGLYCERIN (NITROSTAT) 0.4 MG SL tablet Place 1 tablet (0.4 mg total) under the tongue every 5 (five) minutes x 3 doses as needed for chest pain. 25 tablet 3    ONETOUCH VERIO test strip USE TO CHECK BLOOD SUGAR NO MORE THAN TWICE A DAY 200 strip 12    pantoprazole (PROTONIX) 40 MG tablet Take 1 tablet (40 mg total) by mouth daily before  breakfast. 90 tablet 0    tamsulosin (FLOMAX) 0.4 MG CAPS capsule Take 1 capsule (0.4 mg total) by mouth daily  after supper. 90 capsule 1    zolpidem (AMBIEN) 10 MG tablet Take 10 mg by mouth at bedtime as needed for sleep.      Scheduled:   aspirin EC  81 mg Oral q morning   atorvastatin  20 mg Oral QPM   heparin  2,300 Units Intravenous Once   ipratropium-albuterol  3 mL Nebulization BID   [START ON 10/03/2022] predniSONE  40 mg Oral Q breakfast   PRN:   Assessment: 86 yoM with PMH CAD, Afib with pacemaker but not on anticoagulation, PVD, Pulm HTN, CHF, COPD on 4L O2 at baseline, presenting 12/22 with SOB, CP, cough. Found to have elevated troponins; Cardiology recommending short term heparin infusion per Pharmacy.  Prior anticoagulation: none  Significant events:  Today, 10/02/2022: Heparin level < 0.10 (subtherapeutic) with heparin infusion at 1200 units/hr Hgb/Pltc: WNL No bleeding or infusion issues noted per RN  Goal of Therapy: Heparin level 0.3-0.7 units/ml Monitor platelets by anticoagulation protocol: Yes  Plan: Rebolus heparin 2300 units  Increase heparin infusion to 1500 units/hr  Check heparin level 8 hrs after rate increase Daily CBC, daily heparin level once stable Monitor for signs of bleeding or thrombosis   Pricilla Riffle, PharmD, BCPS Clinical Pharmacist 10/02/2022 3:55 PM

## 2022-10-02 NOTE — Progress Notes (Signed)
ANTICOAGULATION CONSULT NOTE - Follow up Consult  Pharmacy Consult for IV heparin Indication: chest pain/ACS  Allergies  Allergen Reactions   Hydrocodone Other (See Comments)    "given to him in the hospital; went thru withdrawals once home; dr said not to take it again" (09/27/2012) dizziness   Ultram [Tramadol] Other (See Comments)    "given to him in the hospital; went thru withdrawals once home; dr said not to take it again" (09/27/2012) dizziness   Cialis [Tadalafil] Other (See Comments)    Headache Backache  Dizziness    Avelox [Moxifloxacin Hydrochloride] Itching and Other (See Comments)    Headache Dizziness    Patient Measurements: Height: 5\' 9"  (175.3 cm) Weight: 77.1 kg (170 lb) IBW/kg (Calculated) : 70.7 Heparin Dosing Weight: TBW  Vital Signs: Temp: 98 F (36.7 C) (12/23 0537) Temp Source: Oral (12/23 0537) BP: 131/96 (12/23 0537) Pulse Rate: 72 (12/23 0537)  Labs: Recent Labs    10/01/22 1255 10/01/22 1428 10/02/22 0542  HGB 14.2  --  13.3  HCT 43.4  --  39.4  PLT 198  --  223  HEPARINUNFRC  --   --  0.14*  CREATININE  --  0.71  --   TROPONINIHS 577* 685*  --      Estimated Creatinine Clearance: 72.4 mL/min (by C-G formula based on SCr of 0.71 mg/dL).   Medical History: Past Medical History:  Diagnosis Date   Abnormal CT scan, chest 2012   CT chest, several lymphadenopathies. Sees pulmonary   Anemia    intermittent   Arthritis    "?back" (09/19/2018)   Atrial fibrillation (HCC)    s/p AV node ablation & BiV PPM implantation 09/08/11 (op dictation pending)   BPH (benign prostatic hyperplasia)    Saw Dr 09/10/11 2004, normal renal u/s   Bronchitis 08/26/2017   CAD (coronary artery disease)    CHF (congestive heart failure) (HCC)    Thought primarily to be non-systolic although EF down (EF 08/28/2017 12/2010, down to 35-40% 09/05/11), cath 2008 with no CAD, nuclear study 07/2011 showing Small area of reversibility in the distal ant/lat wall  the left ventricle suspicious for ischemia/septal wall HK but felt to be low risk  (per D/C Summary 07/2011)   Chronic bronchitis (HCC)    "get it ~ q yr"   Chronic diastolic heart failure (HCC)         Chronic respiratory failure with hypoxia and hypercapnia (HCC) 09/14/2010   Followed in Pulmonary clinic/ [facility]/ [facility]  Started on 02 2lpm at discharge 09/23/11   - PFT's 10/28/2011  FEV1  1.40 (51%)  with ratio 70 and no better p B2 and DLCO 53 corrects to 101    - PFT's 10/18/2014    FEV1 1.71 (59%) with ratio 68 and no sign change p B2 and DLCO 53 corrects to 85  -  HC03   07/28/20  = 33  -  HCO3   03/17/21     = 31     Heart murmur    HLD (hyperlipidemia)    HTN (hypertension)    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   Pleural effusion 2008   S/p decortication   Pneumonia 08/02/2011   Pulmonary HTN (HCC)  per cath 2008   Type II diabetes mellitus (HCC) 1999    Medications:  Medications Prior to Admission  Medication Sig Dispense Refill Last Dose   acetaminophen (TYLENOL) 500 MG tablet Take 1,000 mg by mouth every 8 (eight) hours as needed for headache or moderate pain.      albuterol (VENTOLIN HFA) 108 (90 Base) MCG/ACT inhaler Inhale 2 puffs into the lungs every 6 (six) hours as needed for wheezing or shortness of breath. (Patient taking differently: Inhale 1 puff into the lungs 2 (two) times daily.) 8.5 g 0    amLODipine (NORVASC) 10 MG tablet Take 1 tablet (10 mg total) by mouth daily. (Patient taking differently: Take 10 mg by mouth in the morning.) 90 tablet 1    aspirin 81 MG tablet Take 81 mg by mouth every morning.       atorvastatin (LIPITOR) 20 MG tablet Take 1 tablet (20 mg total) by mouth at bedtime. (Patient taking differently: Take 20 mg by mouth every evening.) 90 tablet 1     bisoprolol (ZEBETA) 10 MG tablet Take 1 tablet (10 mg total) by mouth daily. 90 tablet 0    budesonide (PULMICORT) 0.5 MG/2ML nebulizer solution Take 2 mLs (0.5 mg total) by nebulization 2 (two) times daily. 360 mL 1    calcium-vitamin D (OSCAL WITH D) 500-200 MG-UNIT tablet Take 1 tablet by mouth every morning.      cholecalciferol (VITAMIN D3) 25 MCG (1000 UNIT) tablet Take 1,000 Units by mouth every evening.      diphenhydramine-acetaminophen (TYLENOL PM) 25-500 MG TABS tablet Take 1 tablet by mouth at bedtime as needed (sleep).      FARXIGA 10 MG TABS tablet TAKE 1 TABLET DAILY BEFORE BREAKFAST (Patient taking differently: Take 10 mg by mouth in the morning.) 90 tablet 3    furosemide (LASIX) 40 MG tablet TAKE 2 TABLETS EVERY MORNING AND 1 TABLET LATE IN THE AFTERNOON (Patient taking differently: Take 40 mg by mouth in the morning.) 270 tablet 3    guaiFENesin (MUCINEX) 600 MG 12 hr tablet Take 600 mg by mouth 2 (two) times daily.      ipratropium-albuterol (DUONEB) 0.5-2.5 (3) MG/3ML SOLN Take 3 mLs by nebulization every 6 (six) hours as needed. (Patient taking differently: Take 3 mLs by nebulization in the morning and at bedtime.) 360 mL 1    Lancets (ONETOUCH ULTRASOFT) lancets Check blood sugars no more than twice daily 200 each 12    Multiple Vitamins-Minerals (MULTIVITAMINS THER. W/MINERALS) TABS tablet Take 1 tablet by mouth daily. (Patient taking differently: Take 1 tablet by mouth in the morning.) 30 each 5    nitroGLYCERIN (NITROSTAT) 0.4 MG SL tablet Place 1 tablet (0.4 mg total) under the tongue every 5 (five) minutes x 3 doses as needed for chest pain. 25 tablet 3    ONETOUCH VERIO test strip USE TO CHECK BLOOD SUGAR NO MORE THAN TWICE A DAY 200 strip 12    pantoprazole (PROTONIX) 40 MG tablet Take 1 tablet (40 mg total) by mouth daily before breakfast. 90 tablet 0    tamsulosin (FLOMAX) 0.4 MG CAPS capsule Take 1 capsule (0.4 mg total) by mouth daily after supper. 90 capsule 1     zolpidem (AMBIEN) 10 MG tablet Take 10 mg by mouth at bedtime as needed for sleep.      Scheduled:   aspirin EC  81 mg Oral q morning   atorvastatin  20 mg Oral QPM   ipratropium-albuterol  3 mL  Nebulization BID   methylPREDNISolone (SOLU-MEDROL) injection  40 mg Intravenous Q12H   Followed by   Melene Muller ON 10/03/2022] predniSONE  40 mg Oral Q breakfast   PRN:   Assessment: 2 yoM with PMH CAD, Afib with pacemaker but not on anticoagulation, PVD, Pulm HTN, CHF, COPD on 4L O2 at baseline, presenting 12/22 with SOB, CP, cough. Found to have elevated troponins; Cardiology recommending short term heparin infusion per Pharmacy.  Baseline INR, aPTT: not done Prior anticoagulation: none  Significant events:  Today, 10/02/2022: Heparin level = 0.14 (subtherapeutic) with heparin gtt @ 950 units/hr Hgb/Pltc: WNL No bleeding or infusion issues noted  Goal of Therapy: Heparin level 0.3-0.7 units/ml Monitor platelets by anticoagulation protocol: Yes  Plan: Rebolus Heparin 2000 units IV bolus x 1 Increase Heparin gtt to 1200 units/hr  Check heparin level 8 hrs after rate increase Daily CBC, daily heparin level once stable Monitor for signs of bleeding or thrombosis  Terrilee Files, PharmD 10/02/2022, 6:15 AM

## 2022-10-02 NOTE — Consult Note (Signed)
CARDIOLOGY CONSULT NOTE       Patient ID: Bradley Searles Sr. MRN: [id number] DOB/AGE: 1941-05-19 81 y.o.  Admit date: 10/01/2022 Referring Physician: Maisie Fus Primary Physician: [name], MD Primary Cardiologist: Simpson/Taylor Reason for Consultation: Elevated Troponin   Principal Problem:   Pneumonia due to COVID-19 virus   HPI:  81 y.o. admitted with COPD/Pneumonia exacerbation with prolonged COVID since 08/27/22 Still with dyspnea and cough History of AV node ablation and PPM His battery is near EOL and will need to be replaced by Dr Ladona Ridgel soon. No chest pain Troponin elevated 577 -> 685 ECG unrevealing due to V pacing No history of CAD last normal myovue 2012.  This am just cough and mild dyspnea No edema  TTE 07/21/22 with EF 55-60% AV sclerosis  Admission CXR CE diffuse bilateral interstitial opacity BNP only 188  ROS All other systems reviewed and negative except as noted above  Past Medical History:  Diagnosis Date   Abnormal CT scan, chest 2012   CT chest, several lymphadenopathies. Sees pulmonary   Anemia    intermittent   Arthritis    "?back" (09/19/2018)   Atrial fibrillation (HCC)    s/p AV node ablation & BiV PPM implantation 09/08/11 (op dictation pending)   BPH (benign prostatic hyperplasia)    Saw Dr [name] 2004, normal renal u/s   Bronchitis 08/26/2017   CAD (coronary artery disease)    CHF (congestive heart failure) (HCC)    Thought primarily to be non-systolic although EF down (EF 88-32% 12/2010, down to 35-40% 09/05/11), cath 2008 with no CAD, nuclear study 07/2011 showing Small area of reversibility in the distal ant/lat wall the left ventricle suspicious for ischemia/septal wall HK but felt to be low risk  (per D/C Summary 07/2011)   Chronic bronchitis (HCC)    "get it ~ q yr"   Chronic diastolic heart failure (HCC)         Chronic respiratory failure with hypoxia and hypercapnia (HCC) 09/14/2010   Followed in Pulmonary clinic/ [facility]/ [facility]  Started on 02 2lpm at discharge 09/23/11   - PFT's 10/28/2011  FEV1  1.40 (51%)  with ratio 70 and no better p B2 and DLCO 53 corrects to 101    - PFT's 10/18/2014    FEV1 1.71 (59%) with ratio 68 and no sign change p B2 and DLCO 53 corrects to 85  -  HC03   07/28/20  = 33  -  HCO3   03/17/21     = 31     Heart murmur    HLD (hyperlipidemia)    HTN (hypertension)    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   Pleural effusion 2008   S/p decortication   Pneumonia 08/02/2011   Pulmonary HTN (HCC)    per cath 2008   Type II diabetes mellitus (HCC) 1999    Family History  Problem Relation Age of Onset   Diabetes Father    Coronary artery disease Father    Polycythemia Mother    Drug abuse Son    Breast cancer Maternal Aunt    Prostate cancer Neg Hx    Colon cancer Neg Hx     Social History   Socioeconomic History  Marital status: Married    Spouse name: [place]   Number of children: 1   Years of education: Not on file   Highest education level: Not on file  Occupational History   Occupation: retired Publishing rights manager: RETIRED  Tobacco Use   Smoking status: Former    Packs/day: 2.50    Years: 25.00    Total pack years: 62.50    Types: Cigarettes, Cigars    Quit date: 10/11/1980    Years since quitting: 42.0   Smokeless tobacco: Never  Vaping Use   Vaping Use: Never used  Substance and Sexual Activity   Alcohol use: Not Currently    Comment: 1 can of beer ever 4-5 months   Drug use: No   Sexual activity: Not Currently  Other Topics Concern   Not on file  Social History Narrative   Moved from [place]   He lives w/ his wife, [name]   1 son, substance abuse, passed away 10-Oct-2016         Social Determinants of Health   Financial Resource Strain: Low Risk   (02/16/2022)   Overall Financial Resource Strain (CARDIA)    Difficulty of Paying Living Expenses: Not hard at all  Food Insecurity: No Food Insecurity (10/01/2022)   Hunger Vital Sign    Worried About Running Out of Food in the Last Year: Never true    Ran Out of Food in the Last Year: Never true  Transportation Needs: No Transportation Needs (10/01/2022)   PRAPARE - Administrator, Civil Service (Medical): No    Lack of Transportation (Non-Medical): No  Physical Activity: Insufficiently Active (10/16/2021)   Exercise Vital Sign    Days of Exercise per Week: 5 days    Minutes of Exercise per Session: 10 min  Stress: No Stress Concern Present (04/02/2021)   [employer] of Occupational Health - Occupational Stress Questionnaire    Feeling of Stress : Not at all  Social Connections: Moderately Integrated (04/02/2021)   Social Connection and Isolation Panel [NHANES]    Frequency of Communication with Friends and Family: More than three times a week    Frequency of Social Gatherings with Friends and Family: More than three times a week    Attends Religious Services: More than 4 times per year    Active Member of [employer] or Organizations: No    Attends Banker Meetings: Never    Marital Status: Married  Catering manager Violence: Not At Risk (10/01/2022)   Humiliation, Afraid, Rape, and Kick questionnaire    Fear of Current or Ex-Partner: No    Emotionally Abused: No    Physically Abused: No    Sexually Abused: No    Past Surgical History:  Procedure Laterality Date   BI-VENTRICULAR PACEMAKER INSERTION Left 09/08/2011   Procedure: BI-VENTRICULAR PACEMAKER INSERTION (CRT-P);  Surgeon: Marinus Maw, MD;  Location: [facility] CATH LAB;  Service: Cardiovascular;  Laterality: Left;   CARDIAC CATHETERIZATION     "couple times; never had balloon or stent" (09/19/2018)   CATARACT EXTRACTION W/ INTRAOCULAR LENS  IMPLANT, BILATERAL Bilateral 08/2018   COLONOSCOPY  03/10/11    normal   INSERT / REPLACE / REMOVE PACEMAKER  09/08/11   pacemaker placement   LUNG DECORTICATION     PLEURAL SCARIFICATION     pneumothorax with fibrothorax  ~ 2010   TONSILLECTOMY     "as a kid"       Current Facility-Administered Medications:  acetaminophen (TYLENOL) tablet 1,000 mg, 1,000 mg, Oral, Q8H PRN, Lurline Del, MD   aspirin EC tablet 81 mg, 81 mg, Oral, q morning, Skip Mayer A, MD   atorvastatin (LIPITOR) tablet 20 mg, 20 mg, Oral, QPM, Skip Mayer A, MD   cefTRIAXone (ROCEPHIN) 1 g in sodium chloride 0.9 % 100 mL IVPB, 1 g, Intravenous, Daily, Skip Mayer A, MD, Last Rate: 200 mL/hr at 10/01/22 2239, 1 g at 10/01/22 2239   heparin ADULT infusion 100 units/mL (25000 units/259mL), 1,200 Units/hr, Intravenous, Continuous, Poindexter, Leann T, RPH, Last Rate: 12 mL/hr at 10/02/22 0628, 1,200 Units/hr at 10/02/22 0628   iohexol (OMNIPAQUE) 300 MG/ML solution 75 mL, 75 mL, Intravenous, Once PRN, Jeanelle Malling, PA   ipratropium-albuterol (DUONEB) 0.5-2.5 (3) MG/3ML nebulizer solution 3 mL, 3 mL, Nebulization, BID, Skip Mayer A, MD, 3 mL at 10/02/22 0807   levalbuterol (XOPENEX) nebulizer solution 0.63 mg, 0.63 mg, Nebulization, Q6H PRN, Lurline Del, MD   methylPREDNISolone sodium succinate (SOLU-MEDROL) 40 mg/mL injection 40 mg, 40 mg, Intravenous, Q12H, 40 mg at 10/01/22 2237 **FOLLOWED BY** [START ON 10/03/2022] predniSONE (DELTASONE) tablet 40 mg, 40 mg, Oral, Q breakfast, Skip Mayer A, MD   ondansetron (ZOFRAN) tablet 4 mg, 4 mg, Oral, Q6H PRN **OR** ondansetron (ZOFRAN) injection 4 mg, 4 mg, Intravenous, Q6H PRN, Lurline Del, MD  aspirin EC  81 mg Oral q morning   atorvastatin  20 mg Oral QPM   ipratropium-albuterol  3 mL Nebulization BID   methylPREDNISolone (SOLU-MEDROL) injection  40 mg Intravenous Q12H   Followed by   Melene Muller ON 10/03/2022] predniSONE  40 mg Oral Q breakfast    cefTRIAXone (ROCEPHIN)  IV 1 g (10/01/22  2239)   heparin 1,200 Units/hr (10/02/22 1610)    Physical Exam: Blood pressure (!) 131/96, pulse 72, temperature 98 F (36.7 C), temperature source Oral, resp. rate 17, height 5\' 9"  (1.753 m), weight 77.1 kg, SpO2 94 %.     Elderly male from Exp wheezing bases PPM under left clavicle SEM AV sclerosis  Abdomen benign  Some bruising on shins   Labs:   Lab Results  Component Value Date   WBC 9.9 10/02/2022   HGB 13.3 10/02/2022   HCT 39.4 10/02/2022   MCV 94.9 10/02/2022   PLT 223 10/02/2022    Recent Labs  Lab 10/02/22 0542  NA 141  K 4.2  CL 103  CO2 27  BUN 19  CREATININE 0.85  CALCIUM 8.9  PROT 6.9  BILITOT 1.0  ALKPHOS 64  ALT 17  AST 24  GLUCOSE 164*   Lab Results  Component Value Date   CKTOTAL 28 07/11/2012   CKMB 2.5 09/05/2011   TROPONINI <0.03 12/21/2018    Lab Results  Component Value Date   CHOL 141 11/11/2021   CHOL 127 12/09/2020   CHOL 144 11/26/2019   Lab Results  Component Value Date   HDL 41.10 11/11/2021   HDL 36.20 (L) 12/09/2020   HDL 36.50 (L) 11/26/2019   Lab Results  Component Value Date   LDLCALC 70 11/11/2021   LDLCALC 70 12/09/2020   LDLCALC 84 11/26/2019   Lab Results  Component Value Date   TRIG 148.0 11/11/2021   TRIG 103.0 12/09/2020   TRIG 120.0 11/26/2019   Lab Results  Component Value Date   CHOLHDL 3 11/11/2021   CHOLHDL 4 12/09/2020   CHOLHDL 4 11/26/2019   No results found for: "LDLDIRECT"    Radiology: DG Chest 2  View  Result Date: 10/01/2022 CLINICAL DATA:  Shortness of breath, cough EXAM: CHEST - 2 VIEW COMPARISON:  08/30/2022 FINDINGS: Cardiomegaly with left chest multi lead pacer. Diffuse bilateral interstitial opacity, similar to prior examinations. Disc degenerative disease of the thoracic spine. IMPRESSION: Cardiomegaly with diffuse bilateral interstitial opacity, similar to prior examinations, likely edema. No new or focal airspace opacity. Electronically Signed   By: Jearld Lesch  M.D.   On: 10/01/2022 13:49   CUP PACEART REMOTE DEVICE CHECK  Result Date: 09/30/2022 Scheduled remote reviewed. Normal device function.  The device reached the elective replacement indicator on 08/28/2022, sent to triage There were 3 ventricular arrhythmias detected, 2 were short NSVT and one was greater than 20 beats and fell into the VT monitor only zone, sent to triage. No next remote scheduled. Hassell Halim, RN, CCDS, [employer]  DG Hand Complete Left  Result Date: 09/07/2022 CLINICAL DATA:  Follow up 3rd metacarpal fracture. EXAM: LEFT HAND - COMPLETE 3+ VIEW COMPARISON:  Radiographs 07/31/2022 FINDINGS: The bones are demineralized. Oblique fracture of the 3rd metacarpal shaft demonstrates 3 mm of posterior displacement (previously 4 mm). No definite fracture healing is seen, although there is some callus formation surrounding the fracture which likely extends into the surrounding soft tissues. No new fractures are identified. There is no dislocation. Mild underlying degenerative changes are stable. IMPRESSION: Slightly decreased posterior displacement of the 3rd metacarpal shaft fracture with interval surrounding callus formation. No definite fracture healing identified. Electronically Signed   By: Carey Bullocks M.D.   On: 09/07/2022 08:35    EKG: Afib V pacing no acute changes    ASSESSMENT AND PLAN:   Elevated Troponin:  no chest pain ECG unrevealing due to pacing. TTE pending given holiday weekend no plance for ischemic testing If TTE shows new RWMA or low EF may consider cath Tuesday otherwise likely outpatient perfusion stress testing Continue heparin  Afib:  chronic with AV node ablation and PPM back up Resume home Zebeta  HpPEF:  continue lasix TTE pending BNP minimally elevated not volume overloaded on exam ? Capillary leak syndrome with COVID PPM:  normal function f/u GT needs battery change in next month   Signed: Charlton Haws 10/02/2022, 8:31 AM

## 2022-10-03 ENCOUNTER — Inpatient Hospital Stay (HOSPITAL_COMMUNITY): Payer: Medicare Other

## 2022-10-03 DIAGNOSIS — U071 COVID-19: Secondary | ICD-10-CM | POA: Diagnosis not present

## 2022-10-03 DIAGNOSIS — I5043 Acute on chronic combined systolic (congestive) and diastolic (congestive) heart failure: Secondary | ICD-10-CM

## 2022-10-03 DIAGNOSIS — I509 Heart failure, unspecified: Secondary | ICD-10-CM | POA: Diagnosis not present

## 2022-10-03 DIAGNOSIS — R7989 Other specified abnormal findings of blood chemistry: Secondary | ICD-10-CM | POA: Diagnosis not present

## 2022-10-03 DIAGNOSIS — I482 Chronic atrial fibrillation, unspecified: Secondary | ICD-10-CM | POA: Diagnosis not present

## 2022-10-03 DIAGNOSIS — J1282 Pneumonia due to coronavirus disease 2019: Secondary | ICD-10-CM | POA: Diagnosis not present

## 2022-10-03 DIAGNOSIS — Z950 Presence of cardiac pacemaker: Secondary | ICD-10-CM | POA: Diagnosis not present

## 2022-10-03 LAB — T4, FREE: Free T4: 0.87 ng/dL (ref 0.61–1.12)

## 2022-10-03 LAB — ECHOCARDIOGRAM COMPLETE
AR max vel: 1.8 cm2
AV Area VTI: 1.91 cm2
AV Area mean vel: 1.83 cm2
AV Mean grad: 4 mmHg
AV Peak grad: 7.7 mmHg
Ao pk vel: 1.39 m/s
Area-P 1/2: 4.39 cm2
Calc EF: 62 %
Height: 69 in
MV M vel: 4.25 m/s
MV Peak grad: 72.1 mmHg
S' Lateral: 3.2 cm
Single Plane A2C EF: 65.8 %
Single Plane A4C EF: 61.9 %
Weight: 2744.29 oz

## 2022-10-03 LAB — CBC
HCT: 36.3 % — ABNORMAL LOW (ref 39.0–52.0)
Hemoglobin: 12.2 g/dL — ABNORMAL LOW (ref 13.0–17.0)
MCH: 31.9 pg (ref 26.0–34.0)
MCHC: 33.6 g/dL (ref 30.0–36.0)
MCV: 94.8 fL (ref 80.0–100.0)
Platelets: 206 10*3/uL (ref 150–400)
RBC: 3.83 MIL/uL — ABNORMAL LOW (ref 4.22–5.81)
RDW: 15.1 % (ref 11.5–15.5)
WBC: 7.2 10*3/uL (ref 4.0–10.5)
nRBC: 0 % (ref 0.0–0.2)

## 2022-10-03 LAB — HEPARIN LEVEL (UNFRACTIONATED)
Heparin Unfractionated: 0.23 IU/mL — ABNORMAL LOW (ref 0.30–0.70)
Heparin Unfractionated: 0.41 IU/mL (ref 0.30–0.70)
Heparin Unfractionated: 0.47 IU/mL (ref 0.30–0.70)

## 2022-10-03 MED ORDER — BUDESONIDE 0.5 MG/2ML IN SUSP
0.5000 mg | Freq: Two times a day (BID) | RESPIRATORY_TRACT | Status: DC
  Administered 2022-10-03 – 2022-10-07 (×8): 0.5 mg via RESPIRATORY_TRACT
  Filled 2022-10-03 (×8): qty 2

## 2022-10-03 MED ORDER — FUROSEMIDE 40 MG PO TABS
40.0000 mg | ORAL_TABLET | Freq: Every day | ORAL | Status: DC
  Administered 2022-10-04: 40 mg via ORAL
  Filled 2022-10-03: qty 1

## 2022-10-03 MED ORDER — TAMSULOSIN HCL 0.4 MG PO CAPS
0.4000 mg | ORAL_CAPSULE | Freq: Every day | ORAL | Status: DC
  Administered 2022-10-03 – 2022-10-06 (×3): 0.4 mg via ORAL
  Filled 2022-10-03 (×4): qty 1

## 2022-10-03 MED ORDER — GUAIFENESIN ER 600 MG PO TB12
600.0000 mg | ORAL_TABLET | Freq: Two times a day (BID) | ORAL | Status: DC
  Administered 2022-10-03 – 2022-10-07 (×8): 600 mg via ORAL
  Filled 2022-10-03 (×8): qty 1

## 2022-10-03 MED ORDER — PANTOPRAZOLE SODIUM 40 MG PO TBEC
40.0000 mg | DELAYED_RELEASE_TABLET | Freq: Every day | ORAL | Status: DC
  Administered 2022-10-04 – 2022-10-07 (×4): 40 mg via ORAL
  Filled 2022-10-03 (×4): qty 1

## 2022-10-03 MED ORDER — OYSTER SHELL CALCIUM/D3 500-5 MG-MCG PO TABS
1.0000 | ORAL_TABLET | Freq: Every morning | ORAL | Status: DC
  Administered 2022-10-04 – 2022-10-07 (×4): 1 via ORAL
  Filled 2022-10-03 (×4): qty 1

## 2022-10-03 MED ORDER — FUROSEMIDE 20 MG PO TABS
20.0000 mg | ORAL_TABLET | Freq: Once | ORAL | Status: AC
  Administered 2022-10-03: 20 mg via ORAL
  Filled 2022-10-03: qty 1

## 2022-10-03 NOTE — Progress Notes (Signed)
  Echocardiogram 2D Echocardiogram has been performed.  Bradley [name] 10/03/2022, 11:35 AM

## 2022-10-03 NOTE — Progress Notes (Signed)
ANTICOAGULATION CONSULT NOTE - Follow up Consult  Pharmacy Consult for IV heparin Indication: chest pain/ACS  Allergies  Allergen Reactions   Hydrocodone Other (See Comments)    "given to him in the hospital; went thru withdrawals once home; dr said not to take it again" (09/27/2012) dizziness   Ultram [Tramadol] Other (See Comments)    "given to him in the hospital; went thru withdrawals once home; dr said not to take it again" (09/27/2012) dizziness   Cialis [Tadalafil] Other (See Comments)    Headache Backache  Dizziness    Avelox [Moxifloxacin Hydrochloride] Itching and Other (See Comments)    Headache Dizziness    Patient Measurements: Height: 5\' 9"  (175.3 cm) Weight: 77.8 kg (171 lb 8.3 oz) IBW/kg (Calculated) : 70.7 Heparin Dosing Weight: TBW  Vital Signs: Temp: 98.3 F (36.8 C) (12/24 0506) Temp Source: Oral (12/24 0506) BP: 135/86 (12/24 0506) Pulse Rate: 71 (12/24 0506)  Labs: Recent Labs    10/01/22 1255 10/01/22 1255 10/01/22 1428 10/02/22 0542 10/02/22 0851 10/02/22 1043 10/02/22 1442 10/03/22 0450 10/03/22 1113  HGB 14.2  --   --  13.3  --   --   --  12.2*  --   HCT 43.4  --   --  39.4  --   --   --  36.3*  --   PLT 198  --   --  223  --   --   --  206  --   HEPARINUNFRC  --    < >  --  0.14*  --   --  <0.10* 0.23* 0.41  CREATININE  --   --  0.71 0.85  --   --   --   --   --   TROPONINIHS 577*  --  685*  --  218* 216*  --   --   --    < > = values in this interval not displayed.     Estimated Creatinine Clearance: 68.2 mL/min (by C-G formula based on SCr of 0.85 mg/dL).   Medical History: Past Medical History:  Diagnosis Date   Abnormal CT scan, chest 2012   CT chest, several lymphadenopathies. Sees pulmonary   Anemia    intermittent   Arthritis    "?back" (09/19/2018)   Atrial fibrillation (HCC)    s/p AV node ablation & BiV PPM implantation 09/08/11 (op dictation pending)   BPH (benign prostatic hyperplasia)    Saw Dr 09/10/11  2004, normal renal u/s   Bronchitis 08/26/2017   CAD (coronary artery disease)    CHF (congestive heart failure) (HCC)    Thought primarily to be non-systolic although EF down (EF 08/28/2017 12/2010, down to 35-40% 09/05/11), cath 2008 with no CAD, nuclear study 07/2011 showing Small area of reversibility in the distal ant/lat wall the left ventricle suspicious for ischemia/septal wall HK but felt to be low risk  (per D/C Summary 07/2011)   Chronic bronchitis (HCC)    "get it ~ q yr"   Chronic diastolic heart failure (HCC)         Chronic respiratory failure with hypoxia and hypercapnia (HCC) 09/14/2010   Followed in Pulmonary clinic/ [facility]/ [facility]  Started on 02 2lpm at discharge 09/23/11   - PFT's 10/28/2011  FEV1  1.40 (51%)  with ratio 70 and no better p B2 and DLCO 53 corrects to 101    - PFT's 10/18/2014    FEV1 1.71 (59%) with ratio 68 and no sign change p B2  and DLCO 53 corrects to 85  -  HC03   07/28/20  = 33  -  HCO3   03/17/21     = 31     Heart murmur    HLD (hyperlipidemia)    HTN (hypertension)    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   Pleural effusion 2008   S/p decortication   Pneumonia 08/02/2011   Pulmonary HTN (HCC)    per cath 2008   Type II diabetes mellitus (HCC) 1999    Medications:  Medications Prior to Admission  Medication Sig Dispense Refill Last Dose   acetaminophen (TYLENOL) 500 MG tablet Take 1,000 mg by mouth every 8 (eight) hours as needed for headache or moderate pain.   Past Week   albuterol (VENTOLIN HFA) 108 (90 Base) MCG/ACT inhaler Inhale 2 puffs into the lungs every 6 (six) hours as needed for wheezing or shortness of breath. (Patient taking differently: Inhale 1 puff into the lungs 2 (two) times daily.) 8.5 g 0 10/02/2022   amLODipine (NORVASC) 10 MG tablet Take 1  tablet (10 mg total) by mouth daily. (Patient taking differently: Take 10 mg by mouth in the morning.) 90 tablet 1 10/02/2022   aspirin 81 MG tablet Take 81 mg by mouth every morning.    10/02/2022   atorvastatin (LIPITOR) 20 MG tablet Take 1 tablet (20 mg total) by mouth at bedtime. (Patient taking differently: Take 20 mg by mouth every evening.) 90 tablet 1 10/01/2022   bisoprolol (ZEBETA) 10 MG tablet Take 1 tablet (10 mg total) by mouth daily. 90 tablet 0 10/02/2022   diphenhydramine-acetaminophen (TYLENOL PM) 25-500 MG TABS tablet Take 1 tablet by mouth at bedtime as needed (sleep).   Past Week   FARXIGA 10 MG TABS tablet TAKE 1 TABLET DAILY BEFORE BREAKFAST (Patient taking differently: Take 10 mg by mouth in the morning.) 90 tablet 3 10/02/2022   furosemide (LASIX) 40 MG tablet TAKE 2 TABLETS EVERY MORNING AND 1 TABLET LATE IN THE AFTERNOON (Patient taking differently: Take 40 mg by mouth in the morning, at noon, and at bedtime.) 270 tablet 3 10/02/2022   ipratropium-albuterol (DUONEB) 0.5-2.5 (3) MG/3ML SOLN Take 3 mLs by nebulization every 6 (six) hours as needed. (Patient taking differently: Take 3 mLs by nebulization in the morning and at bedtime.) 360 mL 1 10/02/2022   nitroGLYCERIN (NITROSTAT) 0.4 MG SL tablet Place 1 tablet (0.4 mg total) under the tongue every 5 (five) minutes x 3 doses as needed for chest pain. 25 tablet 3 unknown   pantoprazole (PROTONIX) 40 MG tablet Take 1 tablet (40 mg total) by mouth daily before breakfast. 90 tablet 0 10/02/2022   tamsulosin (FLOMAX) 0.4 MG CAPS capsule Take 1 capsule (0.4 mg total) by mouth daily after supper. 90 capsule 1 10/01/2022   budesonide (PULMICORT) 0.5 MG/2ML nebulizer solution Take 2 mLs (0.5 mg total) by nebulization 2 (two) times daily. (Patient not taking: Reported on 10/02/2022) 360 mL 1 Completed Course   calcium-vitamin D (OSCAL WITH D) 500-200 MG-UNIT tablet Take 1 tablet by mouth every morning.   unknown   cholecalciferol  (VITAMIN D3) 25 MCG (1000 UNIT) tablet Take 1,000 Units by mouth every evening.   unknown   guaiFENesin (MUCINEX) 600 MG  12 hr tablet Take 600 mg by mouth 2 (two) times daily.   unknown   Lancets (ONETOUCH ULTRASOFT) lancets Check blood sugars no more than twice daily 200 each 12    Multiple Vitamins-Minerals (MULTIVITAMINS THER. W/MINERALS) TABS tablet Take 1 tablet by mouth daily. (Patient not taking: Reported on 10/02/2022) 30 each 5 Completed Course   ONETOUCH VERIO test strip USE TO CHECK BLOOD SUGAR NO MORE THAN TWICE A DAY 200 strip 12    Scheduled:   aspirin EC  81 mg Oral q morning   atorvastatin  20 mg Oral QPM   ipratropium-albuterol  3 mL Nebulization BID   predniSONE  40 mg Oral Q breakfast   PRN:   Assessment: 36 yoM with PMH CAD, Afib with pacemaker but not on anticoagulation, PVD, Pulm HTN, CHF, COPD on 4L O2 at baseline, presenting 12/22 with SOB, CP, cough. Found to have elevated troponins; Cardiology recommending short term heparin infusion per Pharmacy.  Prior anticoagulation: none  Significant events: -IV access lost 12/23 pm - heparin resumed ~ 0030 on 12/24  Today, 10/03/2022: Heparin level 0.41 - therapeutic with heparin infusion at 1500 units/hr Hgb/Pltc: WNL No bleeding or infusion issues noted  Goal of Therapy: Heparin level 0.3-0.7 units/ml Monitor platelets by anticoagulation protocol: Yes  Plan: Continue heparin infusion at 1500 units/hr  Check heparin level ~ 8 hrs to confirm Daily CBC, daily heparin level once stable Monitor for signs of bleeding   Pricilla Riffle, PharmD, BCPS Clinical Pharmacist 10/03/2022 12:07 PM

## 2022-10-03 NOTE — Progress Notes (Signed)
[facility]                                                                               Bradley [name], is a 81 y.o. male, DOB - 1941-06-06, IEP:[id number] Admit date - 10/01/2022    Outpatient Primary MD for the patient is Bradley Plump, MD  LOS - 2  days    Brief summary   Bradley WIEGEL Sr. is a 81 y.o. male with medical history significant of  history of anemia, osteoarthritis, chronic atrial fibrillation status post AV nodal ablation and biventricular pacemaker, BPH, bronchitis, CAD, chronic diastolic heart failure on 5L O2at home,pulmonary hypertension, COPD, chronic respiratory failure, hyperlipidemia, hypertension, diabetes mellitus type 2, intracranial bleed, migraine headache, insomnia, pleural effusions.  Patient also has interim history of admission  08/27/22-08/30/22 with discharge diagnosis of CAP/COPD exacerbation  in setting of COVID infection diagnosed 08/23/22. Patient was treated  antibiotics  as well as steroids for COPD exacerbation with good response and was discharged home.  Patient now return to ED close to one month later with complaints of progressive DOE over the last 1 week associated with central chest discomfort improved with rest.    Assessment & Plan    Assessment and Plan:  Acute COPD exacerbation with acute on chronic hypoxic respiratory failure  Wheezing has improved. He remains on 4 Lit of  Improving, wheezing.  Continue with prednisone taper.  CT chest shows  Patchy opacity at the base of the right lower lobe may be due to atelectasis or pneumonia. Mosaic perfusion in the lungs is consistent small airways disease and air trapping, similar to the appearance on the prior chest CT. Recommend to continue with rocephin and  Continue with duonebs.     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     CHF/ elevated troponins.  Demand ischemia from CHF.  Cardiology on board.  Echocardiogram done and reviewed.    Anemia of  chronic disease:  Continue to monitor.    Chronic atrial fibrillation with AV node ablation   S/p PPM  - plan to change the battery next month.      Pulmonary Hypertension Follow up echocardiogram.     Hypertension;  Well controlled BP parameters.       Hx of ICH -no active issues    DMII CBG (last 3)  Resume SSI.    BPH -continue flomax    Hyperlipidemia:  Resume statin.    Estimated body mass index is 25.33 kg/m as calculated from the following:   Height as of this encounter: 5\' 9"  (1.753 m).   Weight as of this encounter: 77.8 kg.  Code Status: full code.  DVT Prophylaxis:     Level of Care: Level of care: Progressive Family Communication: none at bedside.   Disposition Plan:      Procedures:  None.   Consultants:   Cardiology.   Antimicrobials:   Anti-infectives (From admission, onward)    Start     Dose/Rate Route Frequency Ordered Stop   10/01/22 2045  cefTRIAXone (ROCEPHIN) 1 g in sodium chloride 0.9 % 100 mL IVPB        1 g  200 mL/hr over 30 Minutes Intravenous Daily 10/01/22 2022 10/06/22 0959        Medications  Scheduled Meds:  aspirin EC  81 mg Oral q morning   atorvastatin  20 mg Oral QPM   ipratropium-albuterol  3 mL Nebulization BID   predniSONE  40 mg Oral Q breakfast   Continuous Infusions:  cefTRIAXone (ROCEPHIN)  IV 1 g (10/03/22 1034)   heparin 1,500 Units/hr (10/03/22 0756)   PRN Meds:.acetaminophen, levalbuterol, ondansetron **OR** ondansetron (ZOFRAN) IV    Subjective:   Bradley [name] was seen and examined today. No chest pain. On 4 lit of [place] oxygen.   Objective:   Vitals:   10/02/22 1938 10/02/22 2012 10/03/22 0500 10/03/22 0506  BP:  124/80  135/86  Pulse:  70  71  Resp:  20  19  Temp:  98.3 F (36.8 C)  98.3 F (36.8 C)  TempSrc:  Oral  Oral  SpO2: 99% 97%  94%  Weight:   77.8 kg   Height:        Intake/Output Summary (Last 24 hours) at 10/03/2022 1648 Last data filed at 10/03/2022  1601 Gross per 24 hour  Intake 698.39 ml  Output 1750 ml  Net -1051.61 ml    Filed Weights   10/01/22 1920 10/03/22 0500  Weight: 77.1 kg 77.8 kg     Exam General exam: Appears calm and comfortable  Respiratory system: Clear to auscultation. Respiratory effort normal. Cardiovascular system: S1 & S2 heard, RRR. No JVD,  Gastrointestinal system: Abdomen is nondistended, soft and nontender.  Central nervous system: Alert and oriented. No focal neurological deficits. Extremities: Symmetric 5 x 5 power. Skin: No rashes, lesions or ulcers Psychiatry:   Mood & affect appropriate.      Data Reviewed:  I have personally reviewed following labs and imaging studies   CBC Lab Results  Component Value Date   WBC 7.2 10/03/2022   RBC 3.83 (L) 10/03/2022   HGB 12.2 (L) 10/03/2022   HCT 36.3 (L) 10/03/2022   MCV 94.8 10/03/2022   MCH 31.9 10/03/2022   PLT 206 10/03/2022   MCHC 33.6 10/03/2022   RDW 15.1 10/03/2022   LYMPHSABS 1.7 10/01/2022   MONOABS 0.6 10/01/2022   EOSABS 0.0 10/01/2022   BASOSABS 0.0 10/01/2022     Last metabolic panel Lab Results  Component Value Date   NA 141 10/02/2022   K 4.2 10/02/2022   CL 103 10/02/2022   CO2 27 10/02/2022   BUN 19 10/02/2022   CREATININE 0.85 10/02/2022   GLUCOSE 164 (H) 10/02/2022   GFRNONAA >60 10/02/2022   GFRAA >60 09/18/2019   CALCIUM 8.9 10/02/2022   PHOS 3.3 08/29/2022   PROT 6.9 10/02/2022   ALBUMIN 3.7 10/02/2022   BILITOT 1.0 10/02/2022   ALKPHOS 64 10/02/2022   AST 24 10/02/2022   ALT 17 10/02/2022   ANIONGAP 11 10/02/2022    CBG (last 3)  Recent Labs    10/02/22 0743  GLUCAP 162*       Coagulation Profile: No results for input(s): "INR", "PROTIME" in the last 168 hours.   Radiology Studies: ECHOCARDIOGRAM COMPLETE  Result Date: 10/03/2022    ECHOCARDIOGRAM REPORT   Patient Name:   Bradley KRASNER Sr. Date of Exam: 10/03/2022 Medical Rec #:  [id number]         Height:       69.0 in Accession  #:    [id number]        Weight:  171.5 lb Date of Birth:  1941/10/04          BSA:          1.935 m Patient Age:    81 years          BP:           135/86 mmHg Patient Gender: M                 HR:           72 bpm. Exam Location:  Inpatient Procedure: 2D Echo Indications:    CHF  History:        Patient has prior history of Echocardiogram examinations, most                 recent 07/21/2022. Cardiomyopathy, CAD, Pacemaker, COPD,                 Arrythmias:Atrial Fibrillation; Risk Factors:Diabetes and                 Hypertension.  Sonographer:    Cathie Hoops Referring Phys: [phone] Wendall Stade  Sonographer Comments: Technically difficult study due to poor echo windows. Image acquisition challenging due to COPD. IMPRESSIONS  1. Left ventricular ejection fraction, by estimation, is 55 to 60%. The left ventricle has normal function. Left ventricular endocardial border not optimally defined to evaluate regional wall motion. Left ventricular diastolic parameters are indeterminate.  2. Right ventricular systolic function is mildly reduced. The right ventricular size is normal. There is moderately elevated pulmonary artery systolic pressure. The estimated right ventricular systolic pressure is 50.8 mmHg.  3. Left atrial size was mildly dilated.  4. Right atrial size was mildly dilated.  5. The mitral valve is degenerative. Trivial mitral valve regurgitation. No evidence of mitral stenosis.  6. The aortic valve is tricuspid. There is mild calcification of the aortic valve. There is mild thickening of the aortic valve. Aortic valve regurgitation is not visualized. No aortic stenosis is present.  7. The inferior vena cava is normal in size with <50% respiratory variability, suggesting right atrial pressure of 8 mmHg. Conclusion(s)/Recommendation(s): Consider limited echocardiogram with Definity contrast to better assess regional wall motion if clinically relevant. FINDINGS  Left Ventricle: Left ventricular ejection  fraction, by estimation, is 55 to 60%. The left ventricle has normal function. Left ventricular endocardial border not optimally defined to evaluate regional wall motion. The left ventricular internal cavity size was normal in size. There is no left ventricular hypertrophy. Left ventricular diastolic parameters are indeterminate. Right Ventricle: The right ventricular size is normal. No increase in right ventricular wall thickness. Right ventricular systolic function is mildly reduced. There is moderately elevated pulmonary artery systolic pressure. The tricuspid regurgitant velocity is 3.27 m/s, and with an assumed right atrial pressure of 8 mmHg, the estimated right ventricular systolic pressure is 50.8 mmHg. Left Atrium: Left atrial size was mildly dilated. Right Atrium: Right atrial size was mildly dilated. Pericardium: There is no evidence of pericardial effusion. Mitral Valve: The mitral valve is degenerative in appearance. Mild mitral annular calcification. Trivial mitral valve regurgitation. No evidence of mitral valve stenosis. Tricuspid Valve: The tricuspid valve is normal in structure. Tricuspid valve regurgitation is mild . No evidence of tricuspid stenosis. Aortic Valve: The aortic valve is tricuspid. There is mild calcification of the aortic valve. There is mild thickening of the aortic valve. Aortic valve regurgitation is not visualized. No aortic stenosis is present. Aortic valve mean gradient measures 4.0 mmHg. Aortic  valve peak gradient measures 7.7 mmHg. Aortic valve area, by VTI measures 1.91 cm. Pulmonic Valve: The pulmonic valve was normal in structure. Pulmonic valve regurgitation is trivial. No evidence of pulmonic stenosis. Aorta: The aortic root is normal in size and structure. Venous: The inferior vena cava is normal in size with less than 50% respiratory variability, suggesting right atrial pressure of 8 mmHg. IAS/Shunts: The atrial septum is grossly normal.  LEFT VENTRICLE PLAX 2D  LVIDd:         4.70 cm     Diastology LVIDs:         3.20 cm     LV e' medial:    11.10 cm/s LV PW:         0.80 cm     LV E/e' medial:  8.6 LV IVS:        0.80 cm     LV e' lateral:   10.70 cm/s LVOT diam:     2.20 cm     LV E/e' lateral: 8.9 LV SV:         45 LV SV Index:   23 LVOT Area:     3.80 cm  LV Volumes (MOD) LV vol d, MOD A2C: 74.9 ml LV vol d, MOD A4C: 63.6 ml LV vol s, MOD A2C: 25.6 ml LV vol s, MOD A4C: 24.2 ml LV SV MOD A2C:     49.3 ml LV SV MOD A4C:     63.6 ml LV SV MOD BP:      42.9 ml RIGHT VENTRICLE RV Basal diam:  4.00 cm RV Mid diam:    3.40 cm RV S prime:     12.60 cm/s TAPSE (M-mode): 2.4 cm LEFT ATRIUM             Index        RIGHT ATRIUM           Index LA diam:        4.20 cm 2.17 cm/m   RA Area:     22.10 cm LA Vol (A2C):   81.1 ml 41.90 ml/m  RA Volume:   56.50 ml  29.19 ml/m LA Vol (A4C):   75.4 ml 38.96 ml/m LA Biplane Vol: 82.1 ml 42.42 ml/m  AORTIC VALVE                    PULMONIC VALVE AV Area (Vmax):    1.80 cm     PV Vmax:          0.87 m/s AV Area (Vmean):   1.83 cm     PV Peak grad:     3.0 mmHg AV Area (VTI):     1.91 cm     PR End Diast Vel: 7.62 msec AV Vmax:           139.00 cm/s AV Vmean:          98.300 cm/s AV VTI:            0.237 m AV Peak Grad:      7.7 mmHg AV Mean Grad:      4.0 mmHg LVOT Vmax:         65.90 cm/s LVOT Vmean:        47.300 cm/s LVOT VTI:          0.119 m LVOT/AV VTI ratio: 0.50  AORTA Ao Root diam: 3.30 cm Ao Asc diam:  3.50 cm MITRAL VALVE  TRICUSPID VALVE MV Area (PHT): 4.39 cm    TR Peak grad:   42.8 mmHg MV Decel Time: 173 msec    TR Vmax:        327.00 cm/s MR Peak grad: 72.1 mmHg MR Vmax:      424.50 cm/s  SHUNTS MV E velocity: 95.10 cm/s  Systemic VTI:  0.12 m MV A velocity: 26.60 cm/s  Systemic Diam: 2.20 cm MV E/A ratio:  3.58 Weston Brass MD Electronically signed by Weston Brass MD Signature Date/Time: 10/03/2022/11:55:23 AM    Final    CT Angio Chest Pulmonary Embolism (PE) W or WO Contrast  Result Date:  10/02/2022 CLINICAL DATA:  Short of breath. 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 EXAM: CT ANGIOGRAPHY CHEST WITH CONTRAST TECHNIQUE: Multidetector CT imaging of the chest was performed using the standard protocol during bolus administration of intravenous contrast. Multiplanar CT image reconstructions and MIPs were obtained to evaluate the vascular anatomy. RADIATION DOSE REDUCTION: This exam was performed according to the departmental dose-optimization program which includes automated exposure control, adjustment of the mA and/or kV according to patient size and/or use of iterative reconstruction technique. CONTRAST:  OMNIPAQUE IOHEXOL 350 MG/ML SOLN COMPARISON:  07/30/2021. FINDINGS: Cardiovascular: Suboptimal opacification of the pulmonary arteries limits assessment of the segmental and smaller vessels for the presence of pulmonary emboli. Allowing for this limitation, there is no evidence of a large or central pulmonary embolism. Heart is mildly enlarged. Three-vessel coronary artery calcifications. No pericardial effusion. Great vessels are normal in caliber. No aortic dissection. Mild aortic atherosclerosis. Arch branch vessels are widely patent. Mediastinum/Nodes: No neck base, mediastinal or hilar masses. Mildly enlarged right subcarinal lymph node, 1.4 cm in short axis, less prominent than on the prior CT. Trachea esophagus are unremarkable. Lungs/Pleura: Patchy opacity noted at the base of the right lower lobe. Mild bilateral interstitial thickening most evident in the lower lungs. Mild dependent atelectasis or scarring in the left upper lobe lingula and in the anterior inferior right upper lobe. Mild mosaic perfusion bilaterally suggest small airways disease/air trapping. No pleural effusion or pneumothorax. Upper Abdomen: No acute findings. Musculoskeletal: No fracture or acute finding. No bone lesion. No chest wall mass. Review of the MIP images confirms the above findings. IMPRESSION: 1.  Suboptimal study. No evidence of a large or central pulmonary embolism. 2. Patchy opacity at the base of the right lower lobe may be due to atelectasis or pneumonia. 3. Mosaic perfusion in the lungs is consistent small airways disease and air trapping, similar to the appearance on the prior chest CT. 4. No evidence of pulmonary edema. 5. Cardiomegaly and three-vessel coronary artery calcifications. Mild aortic atherosclerosis. Aortic Atherosclerosis (ICD10-I70.0). Electronically Signed   By: Amie Portland M.D.   On: 10/02/2022 12:49       Kathlen Mody M.D. [facility] 10/03/2022, 4:48 PM  Available via Epic secure chat 7am-7pm After 7 pm, please refer to night coverage provider listed on amion.

## 2022-10-03 NOTE — Progress Notes (Signed)
Subjective:  Cough with some dyspnea no chest pain   Objective:  Vitals:   10/02/22 1938 10/02/22 2012 10/03/22 0500 10/03/22 0506  BP:  124/80  135/86  Pulse:  70  71  Resp:  20  19  Temp:  98.3 F (36.8 C)  98.3 F (36.8 C)  TempSrc:  Oral  Oral  SpO2: 99% 97%  94%  Weight:   77.8 kg   Height:        Intake/Output from previous day:  Intake/Output Summary (Last 24 hours) at 10/03/2022 0802 Last data filed at 10/03/2022 1610 Gross per 24 hour  Intake 1002.4 ml  Output 2000 ml  Net -997.6 ml    Physical Exam: Elderlly male from [place] Atelectasis base No murmur  No edema PPM under left clavicle  Abdomen benign  Bruising on shins   Lab Results: Basic Metabolic Panel: Recent Labs    10/01/22 1428 10/02/22 0542  NA 137 141  K 4.7 4.2  CL 102 103  CO2 24 27  GLUCOSE 141* 164*  BUN 12 19  CREATININE 0.71 0.85  CALCIUM 8.5* 8.9   Liver Function Tests: Recent Labs    10/01/22 1428 10/02/22 0542  AST 37 24  ALT 15 17  ALKPHOS 75 64  BILITOT 1.4* 1.0  PROT 7.2 6.9  ALBUMIN 3.8 3.7   No results for input(s): "LIPASE", "AMYLASE" in the last 72 hours. CBC: Recent Labs    10/01/22 1255 10/02/22 0542 10/03/22 0450  WBC 9.8 9.9 7.2  NEUTROABS 7.5  --   --   HGB 14.2 13.3 12.2*  HCT 43.4 39.4 36.3*  MCV 96.4 94.9 94.8  PLT 198 223 206   Cardiac Enzymes: No results for input(s): "CKTOTAL", "CKMB", "CKMBINDEX", "TROPONINI" in the last 72 hours. BNP: Invalid input(s): "POCBNP" D-Dimer: Recent Labs    10/02/22 0851  DDIMER 0.45    Thyroid Function Tests: Recent Labs    10/01/22 2112  TSH 0.285*   Anemia Panel: Recent Labs    10/01/22 2112  FERRITIN 101    Imaging: CT Angio Chest Pulmonary Embolism (PE) W or WO Contrast  Result Date: 10/02/2022 CLINICAL DATA:  Short of breath. 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 EXAM: CT ANGIOGRAPHY CHEST WITH CONTRAST TECHNIQUE: Multidetector CT imaging of the chest was performed using  the standard protocol during bolus administration of intravenous contrast. Multiplanar CT image reconstructions and MIPs were obtained to evaluate the vascular anatomy. RADIATION DOSE REDUCTION: This exam was performed according to the departmental dose-optimization program which includes automated exposure control, adjustment of the mA and/or kV according to patient size and/or use of iterative reconstruction technique. CONTRAST:  OMNIPAQUE IOHEXOL 350 MG/ML SOLN COMPARISON:  07/30/2021. FINDINGS: Cardiovascular: Suboptimal opacification of the pulmonary arteries limits assessment of the segmental and smaller vessels for the presence of pulmonary emboli. Allowing for this limitation, there is no evidence of a large or central pulmonary embolism. Heart is mildly enlarged. Three-vessel coronary artery calcifications. No pericardial effusion. Great vessels are normal in caliber. No aortic dissection. Mild aortic atherosclerosis. Arch branch vessels are widely patent. Mediastinum/Nodes: No neck base, mediastinal or hilar masses. Mildly enlarged right subcarinal lymph node, 1.4 cm in short axis, less prominent than on the prior CT. Trachea esophagus are unremarkable. Lungs/Pleura: Patchy opacity noted at the base of the right lower lobe. Mild bilateral interstitial thickening most evident in the lower lungs. Mild dependent atelectasis or scarring in the left upper lobe lingula and in the anterior inferior  right upper lobe. Mild mosaic perfusion bilaterally suggest small airways disease/air trapping. No pleural effusion or pneumothorax. Upper Abdomen: No acute findings. Musculoskeletal: No fracture or acute finding. No bone lesion. No chest wall mass. Review of the MIP images confirms the above findings. IMPRESSION: 1. Suboptimal study. No evidence of a large or central pulmonary embolism. 2. Patchy opacity at the base of the right lower lobe may be due to atelectasis or pneumonia. 3. Mosaic perfusion in the lungs  is consistent small airways disease and air trapping, similar to the appearance on the prior chest CT. 4. No evidence of pulmonary edema. 5. Cardiomegaly and three-vessel coronary artery calcifications. Mild aortic atherosclerosis. Aortic Atherosclerosis (ICD10-I70.0). Electronically Signed   By: Amie Portland M.D.   On: 10/02/2022 12:49   DG Chest 2 View  Result Date: 10/01/2022 CLINICAL DATA:  Shortness of breath, cough EXAM: CHEST - 2 VIEW COMPARISON:  08/30/2022 FINDINGS: Cardiomegaly with left chest multi lead pacer. Diffuse bilateral interstitial opacity, similar to prior examinations. Disc degenerative disease of the thoracic spine. IMPRESSION: Cardiomegaly with diffuse bilateral interstitial opacity, similar to prior examinations, likely edema. No new or focal airspace opacity. Electronically Signed   By: Jearld Lesch M.D.   On: 10/01/2022 13:49    Cardiac Studies:  ECG: Afib V pacing    Telemetry: Afib V pacing   Echo: pending   Medications:    aspirin EC  81 mg Oral q morning   atorvastatin  20 mg Oral QPM   ipratropium-albuterol  3 mL Nebulization BID   predniSONE  40 mg Oral Q breakfast      cefTRIAXone (ROCEPHIN)  IV 1 g (10/02/22 0929)   heparin 1,500 Units/hr (10/03/22 0756)    Assessment/Plan:   Elevated Troponin:  no chest pain ECG unrevealing due to pacing. TTE pending given holiday weekend no plance for ischemic testing If TTE shows new RWMA or low EF may consider cath Tuesday otherwise likely outpatient perfusion stress testing Continue heparin  2.  Afib:  chronic with AV node ablation and PPM back up Resume home   [place]  3.  HpPEF:  continue lasix TTE pending BNP minimally elevated not volume overloaded on exam ? Capillary leak syndrome with COVID CT chest suggests RLL atelectasis/pneumonia with mosaic perfusion in lungs consistent with small airway dx and air trapping  4.  PPM:  normal function f/u GT needs battery change in next month   Charlton Haws 10/03/2022, 8:02 AM

## 2022-10-03 NOTE — Progress Notes (Signed)
ANTICOAGULATION CONSULT NOTE - Follow up Consult  Pharmacy Consult for IV heparin Indication: chest pain/ACS  Allergies  Allergen Reactions   Hydrocodone Other (See Comments)    "given to him in the hospital; went thru withdrawals once home; dr said not to take it again" (09/27/2012) dizziness   Ultram [Tramadol] Other (See Comments)    "given to him in the hospital; went thru withdrawals once home; dr said not to take it again" (09/27/2012) dizziness   Cialis [Tadalafil] Other (See Comments)    Headache Backache  Dizziness    Avelox [Moxifloxacin Hydrochloride] Itching and Other (See Comments)    Headache Dizziness    Patient Measurements: Height: 5\' 9"  (175.3 cm) Weight: 77.8 kg (171 lb 8.3 oz) IBW/kg (Calculated) : 70.7 Heparin Dosing Weight: TBW  Vital Signs:    Labs: Recent Labs    10/01/22 1255 10/01/22 1428 10/02/22 0542 10/02/22 0851 10/02/22 1043 10/02/22 1442 10/03/22 0450 10/03/22 1113 10/03/22 1856  HGB 14.2  --  13.3  --   --   --  12.2*  --   --   HCT 43.4  --  39.4  --   --   --  36.3*  --   --   PLT 198  --  223  --   --   --  206  --   --   HEPARINUNFRC  --   --  0.14*  --   --    < > 0.23* 0.41 0.47  CREATININE  --  0.71 0.85  --   --   --   --   --   --   TROPONINIHS 577* 685*  --  218* 216*  --   --   --   --    < > = values in this interval not displayed.     Estimated Creatinine Clearance: 68.2 mL/min (by C-G formula based on SCr of 0.85 mg/dL).   Medical History: Past Medical History:  Diagnosis Date   Abnormal CT scan, chest 2012   CT chest, several lymphadenopathies. Sees pulmonary   Anemia    intermittent   Arthritis    "?back" (09/19/2018)   Atrial fibrillation (HCC)    s/p AV node ablation & BiV PPM implantation 09/08/11 (op dictation pending)   BPH (benign prostatic hyperplasia)    Saw Dr 09/10/11 2004, normal renal u/s   Bronchitis 08/26/2017   CAD (coronary artery disease)    CHF (congestive heart failure) (HCC)     Thought primarily to be non-systolic although EF down (EF 08/28/2017 12/2010, down to 35-40% 09/05/11), cath 2008 with no CAD, nuclear study 07/2011 showing Small area of reversibility in the distal ant/lat wall the left ventricle suspicious for ischemia/septal wall HK but felt to be low risk  (per D/C Summary 07/2011)   Chronic bronchitis (HCC)    "get it ~ q yr"   Chronic diastolic heart failure (HCC)         Chronic respiratory failure with hypoxia and hypercapnia (HCC) 09/14/2010   Followed in Pulmonary clinic/ [facility]/ [facility]  Started on 02 2lpm at discharge 09/23/11   - PFT's 10/28/2011  FEV1  1.40 (51%)  with ratio 70 and no better p B2 and DLCO 53 corrects to 101    - PFT's 10/18/2014    FEV1 1.71 (59%) with ratio 68 and no sign change p B2 and DLCO 53 corrects to 85  -  HC03   07/28/20  = 33  -  HCO3   03/17/21     = 31     Heart murmur    HLD (hyperlipidemia)    HTN (hypertension)    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   Pleural effusion 2008   S/p decortication   Pneumonia 08/02/2011   Pulmonary HTN (HCC)    per cath 2008   Type II diabetes mellitus (HCC) 1999    Medications:  Medications Prior to Admission  Medication Sig Dispense Refill Last Dose   acetaminophen (TYLENOL) 500 MG tablet Take 1,000 mg by mouth every 8 (eight) hours as needed for headache or moderate pain.   Past Week   albuterol (VENTOLIN HFA) 108 (90 Base) MCG/ACT inhaler Inhale 2 puffs into the lungs every 6 (six) hours as needed for wheezing or shortness of breath. (Patient taking differently: Inhale 1 puff into the lungs 2 (two) times daily.) 8.5 g 0 10/02/2022   amLODipine (NORVASC) 10 MG tablet Take 1 tablet (10 mg total) by mouth daily. (Patient taking differently: Take 10 mg by mouth in the morning.) 90 tablet 1  10/02/2022   aspirin 81 MG tablet Take 81 mg by mouth every morning.    10/02/2022   atorvastatin (LIPITOR) 20 MG tablet Take 1 tablet (20 mg total) by mouth at bedtime. (Patient taking differently: Take 20 mg by mouth every evening.) 90 tablet 1 10/01/2022   bisoprolol (ZEBETA) 10 MG tablet Take 1 tablet (10 mg total) by mouth daily. 90 tablet 0 10/02/2022   diphenhydramine-acetaminophen (TYLENOL PM) 25-500 MG TABS tablet Take 1 tablet by mouth at bedtime as needed (sleep).   Past Week   FARXIGA 10 MG TABS tablet TAKE 1 TABLET DAILY BEFORE BREAKFAST (Patient taking differently: Take 10 mg by mouth in the morning.) 90 tablet 3 10/02/2022   furosemide (LASIX) 40 MG tablet TAKE 2 TABLETS EVERY MORNING AND 1 TABLET LATE IN THE AFTERNOON (Patient taking differently: Take 40 mg by mouth in the morning, at noon, and at bedtime.) 270 tablet 3 10/02/2022   ipratropium-albuterol (DUONEB) 0.5-2.5 (3) MG/3ML SOLN Take 3 mLs by nebulization every 6 (six) hours as needed. (Patient taking differently: Take 3 mLs by nebulization in the morning and at bedtime.) 360 mL 1 10/02/2022   nitroGLYCERIN (NITROSTAT) 0.4 MG SL tablet Place 1 tablet (0.4 mg total) under the tongue every 5 (five) minutes x 3 doses as needed for chest pain. 25 tablet 3 unknown   pantoprazole (PROTONIX) 40 MG tablet Take 1 tablet (40 mg total) by mouth daily before breakfast. 90 tablet 0 10/02/2022   tamsulosin (FLOMAX) 0.4 MG CAPS capsule Take 1 capsule (0.4 mg total) by mouth daily after supper. 90 capsule 1 10/01/2022   budesonide (PULMICORT) 0.5 MG/2ML nebulizer solution Take 2 mLs (0.5 mg total) by nebulization 2 (two) times daily. (Patient not taking: Reported on 10/02/2022) 360 mL 1 Completed Course   calcium-vitamin D (OSCAL WITH D) 500-200 MG-UNIT tablet Take 1 tablet by mouth every morning.   unknown   cholecalciferol (VITAMIN D3) 25 MCG (1000 UNIT) tablet Take 1,000 Units by mouth every evening.   unknown   guaiFENesin (MUCINEX) 600 MG  12 hr tablet Take 600 mg by mouth 2 (two) times daily.   unknown   Lancets [facility]  ULTRASOFT) lancets Check blood sugars no more than twice daily 200 each 12    Multiple Vitamins-Minerals (MULTIVITAMINS THER. W/MINERALS) TABS tablet Take 1 tablet by mouth daily. (Patient not taking: Reported on 10/02/2022) 30 each 5 Completed Course   ONETOUCH VERIO test strip USE TO CHECK BLOOD SUGAR NO MORE THAN TWICE A DAY 200 strip 12    Scheduled:   aspirin EC  81 mg Oral q morning   atorvastatin  20 mg Oral QPM   budesonide  0.5 mg Nebulization BID   [START ON 10/04/2022] calcium-vitamin D  1 tablet Oral q morning   [START ON 10/04/2022] furosemide  40 mg Oral Daily   guaiFENesin  600 mg Oral BID   ipratropium-albuterol  3 mL Nebulization BID   [START ON 10/04/2022] pantoprazole  40 mg Oral QAC breakfast   predniSONE  40 mg Oral Q breakfast   tamsulosin  0.4 mg Oral QPC supper   PRN:   Assessment: 23 yoM with PMH CAD, Afib with pacemaker but not on anticoagulation, PVD, Pulm HTN, CHF, COPD on 4L O2 at baseline, presenting 12/22 with SOB, CP, cough. Found to have elevated troponins; Cardiology recommending short term heparin infusion per Pharmacy.  Prior anticoagulation: none  Significant events: -IV access lost 12/23 pm - heparin resumed ~ 0030 on 12/24  Today, 10/03/2022: Heparin level 0.47 - therapeutic x 2 with heparin infusion at 1500 units/hr Hgb/Pltc: WNL No bleeding or infusion issues noted  Goal of Therapy: Heparin level 0.3-0.7 units/ml Monitor platelets by anticoagulation protocol: Yes  Plan: Continue heparin infusion at 1500 units/hr  Daily CBC, daily heparin level once stable Monitor for signs of bleeding   Loralee Pacas, PharmD, BCPS Clinical Pharmacist Pharmacy: [phone] 10/03/2022 8:14 PM

## 2022-10-03 NOTE — Progress Notes (Signed)
  Transition of Care [facility]) Screening Note   Patient Details  Name: Bradley MELLINGER Sr. Date of Birth: 02/28/1941   Transition of Care [facility]) CM/SW Contact:    Amada Jupiter, LCSW Phone Number: 10/03/2022, 3:07 PM    Transition of Care Department [facility]) has reviewed patient and no TOC needs have been identified at this time. We will continue to monitor patient advancement through interdisciplinary progression rounds. If new patient transition needs arise, please place a TOC consult.

## 2022-10-04 DIAGNOSIS — I482 Chronic atrial fibrillation, unspecified: Secondary | ICD-10-CM | POA: Diagnosis not present

## 2022-10-04 DIAGNOSIS — R7989 Other specified abnormal findings of blood chemistry: Secondary | ICD-10-CM | POA: Diagnosis not present

## 2022-10-04 DIAGNOSIS — J1282 Pneumonia due to coronavirus disease 2019: Secondary | ICD-10-CM | POA: Diagnosis not present

## 2022-10-04 DIAGNOSIS — I509 Heart failure, unspecified: Secondary | ICD-10-CM | POA: Diagnosis not present

## 2022-10-04 DIAGNOSIS — U071 COVID-19: Secondary | ICD-10-CM | POA: Diagnosis not present

## 2022-10-04 DIAGNOSIS — R072 Precordial pain: Secondary | ICD-10-CM | POA: Diagnosis not present

## 2022-10-04 DIAGNOSIS — Z950 Presence of cardiac pacemaker: Secondary | ICD-10-CM | POA: Diagnosis not present

## 2022-10-04 LAB — CBC
HCT: 38.5 % — ABNORMAL LOW (ref 39.0–52.0)
Hemoglobin: 12.5 g/dL — ABNORMAL LOW (ref 13.0–17.0)
MCH: 31.5 pg (ref 26.0–34.0)
MCHC: 32.5 g/dL (ref 30.0–36.0)
MCV: 97 fL (ref 80.0–100.0)
Platelets: 210 10*3/uL (ref 150–400)
RBC: 3.97 MIL/uL — ABNORMAL LOW (ref 4.22–5.81)
RDW: 14.8 % (ref 11.5–15.5)
WBC: 9.1 10*3/uL (ref 4.0–10.5)
nRBC: 0 % (ref 0.0–0.2)

## 2022-10-04 LAB — T3, FREE: T3, Free: 1.5 pg/mL — ABNORMAL LOW (ref 2.0–4.4)

## 2022-10-04 LAB — BASIC METABOLIC PANEL
Anion gap: 4 — ABNORMAL LOW (ref 5–15)
BUN: 22 mg/dL (ref 8–23)
CO2: 30 mmol/L (ref 22–32)
Calcium: 8.4 mg/dL — ABNORMAL LOW (ref 8.9–10.3)
Chloride: 104 mmol/L (ref 98–111)
Creatinine, Ser: 0.72 mg/dL (ref 0.61–1.24)
GFR, Estimated: >60 mL/min (ref >60)
Glucose, Bld: 128 mg/dL — ABNORMAL HIGH (ref 70–99)
Potassium: 3.7 mmol/L (ref 3.5–5.1)
Sodium: 138 mmol/L (ref 135–145)

## 2022-10-04 LAB — HEPARIN LEVEL (UNFRACTIONATED): Heparin Unfractionated: 0.5 IU/mL (ref 0.30–0.70)

## 2022-10-04 LAB — MAGNESIUM: Magnesium: 2.5 mg/dL — ABNORMAL HIGH (ref 1.7–2.4)

## 2022-10-04 LAB — PHOSPHORUS: Phosphorus: 2.6 mg/dL (ref 2.5–4.6)

## 2022-10-04 MED ORDER — MELATONIN 5 MG PO TABS
5.0000 mg | ORAL_TABLET | Freq: Once | ORAL | Status: AC
  Administered 2022-10-04: 5 mg via ORAL
  Filled 2022-10-04: qty 1

## 2022-10-04 MED ORDER — SODIUM CHLORIDE 0.9% FLUSH
3.0000 mL | Freq: Two times a day (BID) | INTRAVENOUS | Status: DC
  Administered 2022-10-04 – 2022-10-05 (×3): 3 mL via INTRAVENOUS

## 2022-10-04 MED ORDER — BISOPROLOL FUMARATE 5 MG PO TABS
5.0000 mg | ORAL_TABLET | Freq: Every day | ORAL | Status: DC
  Administered 2022-10-04 – 2022-10-06 (×3): 5 mg via ORAL
  Filled 2022-10-04 (×3): qty 1

## 2022-10-04 NOTE — Evaluation (Signed)
Physical Therapy Evaluation Patient Details Name: Bradley Bradley Sr. MRN: [id number] DOB: Sep 17, 1941 Today's Date: 10/04/2022  History of Present Illness  Patient is 81 y.o. male with recent admission  08/27/22-08/30/22 with diagnosis of CAP/COPD exacerbation in setting of COVID infection diagnosed 08/23/22. Patient now returning to ED on 10/01/22 with complaints of progressive DOE over the last 1 week associated with central chest discomfort improved with rest. PMH significant of anemia, osteoarthritis, chronic atrial fibrillation status post AV nodal ablation and biventricular pacemaker, BPH, bronchitis, CAD, chronic diastolic heart failure on 5L O2at home,pulmonary hypertension, COPD, chronic respiratory failure, hyperlipidemia, hypertension, diabetes mellitus type 2, intracranial bleed, migraine headache, insomnia, pleural effusions.   Clinical Impression  [name] is 81 y.o. male admitted with above HPI and diagnosis. Patient is currently limited by functional impairments below (see PT problem list). Patient lives with his wife and is independent at baseline. Patient evaluated by Physical Therapy with no further acute PT needs identified. Currently he requires min guard/supervision with RW for gait and ambulated ~400' with no LOB using IV pole for steadying and SpO2 94% on 4L/min. All education has been completed and the patient has no further questions. See below for any follow-up Physical Therapy or equipment needs. PT is signing off. Thank you for this referral.        Recommendations for follow up therapy are one component of a multi-disciplinary discharge planning process, led by the attending physician.  Recommendations may be updated based on patient status, additional functional criteria and insurance authorization.  Follow Up Recommendations  (maybe OPPT for cardiopulm rehab)      Assistance Recommended at Discharge Intermittent Supervision/Assistance  Patient can return home  with the following  A little help with walking and/or transfers;A little help with bathing/dressing/bathroom;Assistance with cooking/housework;Assist for transportation    Equipment Recommendations None recommended by PT  Recommendations for Other Services       Functional Status Assessment Patient has had a recent decline in their functional status and demonstrates the ability to make significant improvements in function in a reasonable and predictable amount of time.     Precautions / Restrictions Precautions Precautions: Fall Restrictions Weight Bearing Restrictions: No      Mobility  Bed Mobility Overal bed mobility: Needs Assistance             General bed mobility comments: supervision, pt taking extra time and use of bed rail.    Transfers Overall transfer level: Needs assistance Equipment used: None Transfers: Sit to/from Stand Sit to Stand: Supervision           General transfer comment: sup for safety    Ambulation/Gait Ambulation/Gait assistance: Min guard Gait Distance (Feet): 400 Feet Assistive device: IV Pole Gait Pattern/deviations: Step-through pattern, Decreased stride length Gait velocity: decr     General Gait Details: cues at start for safe step pattern and management of IV pole. guarding for safety. HR stable and SpO2 94% on 4L/min. pt denied SOB.  Stairs            Wheelchair Mobility    Modified Rankin (Stroke Patients Only)       Balance Overall balance assessment: Needs assistance Sitting-balance support: Feet supported, Bilateral upper extremity supported Sitting balance-Leahy Scale: Poor     Standing balance support: Reliant on assistive device for balance, During functional activity, Single extremity supported Standing balance-Leahy Scale: Fair  Pertinent Vitals/Pain Pain Assessment Pain Assessment: No/denies pain    Home Living Family/patient expects to be  discharged to:: Private residence Living Arrangements: Spouse/significant other Available Help at Discharge: Family;Available PRN/intermittently Type of Home: House Home Access: Stairs to enter   Entrance Stairs-Number of Steps: 3 Alternate Level Stairs-Number of Steps: stair lift available to 2nd floor Home Layout: Multi-level Home Equipment: Cane - single point;Shower seat - built in;Grab bars - tub/shower      Prior Function Prior Level of Function : Independent/Modified Independent             Mobility Comments: no AD ADLs Comments: grocery shops, does the stairs, cooks, assists wife (had 2 broken hips)     Hand Dominance        Extremity/Trunk Assessment   Upper Extremity Assessment Upper Extremity Assessment: Overall WFL for tasks assessed    Lower Extremity Assessment Lower Extremity Assessment: Overall WFL for tasks assessed    Cervical / Trunk Assessment Cervical / Trunk Assessment: Normal  Communication   Communication: No difficulties  Cognition Arousal/Alertness: Awake/alert Behavior During Therapy: WFL for tasks assessed/performed Overall Cognitive Status: Within Functional Limits for tasks assessed                                          General Comments      Exercises     Assessment/Plan    PT Assessment Patient needs continued PT services  PT Problem List Decreased strength;Decreased mobility;Decreased balance;Decreased activity tolerance;Decreased range of motion;Decreased knowledge of use of DME;Decreased knowledge of precautions;Obesity       PT Treatment Interventions DME instruction;Gait training;Stair training;Functional mobility training;Therapeutic activities;Therapeutic exercise;Balance training;Patient/family education    PT Goals (Current goals can be found in the Care Plan section)  Acute Rehab PT Goals Patient Stated Goal: figure out what is going on with his heart and SOB PT Goal Formulation: All  assessment and education complete, DC therapy Time For Goal Achievement: 10/18/22 Potential to Achieve Goals: Good    Frequency 7X/week     Co-evaluation               AM-PAC PT "6 Clicks" Mobility  Outcome Measure Help needed turning from your back to your side while in a flat bed without using bedrails?: A Little Help needed moving from lying on your back to sitting on the side of a flat bed without using bedrails?: A Little Help needed moving to and from a bed to a chair (including a wheelchair)?: A Little Help needed standing up from a chair using your arms (e.g., wheelchair or bedside chair)?: A Little Help needed to walk in hospital room?: A Little Help needed climbing 3-5 steps with a railing? : A Little 6 Click Score: 18    End of Session Equipment Utilized During Treatment: Gait belt Activity Tolerance: Patient tolerated treatment well Patient left: in chair;with call bell/phone within reach;with nursing/sitter in room;with chair alarm set Nurse Communication: Mobility status PT Visit Diagnosis: Muscle weakness (generalized) (M62.81);Difficulty in walking, not elsewhere classified (R26.2);Other abnormalities of gait and mobility (R26.89)    Time: 1352-1416 PT Time Calculation (min) (ACUTE ONLY): 24 min   Charges:   PT Evaluation $PT Eval Low Complexity: 1 Low PT Treatments $Gait Training: 8-22 mins        Wynn Maudlin, DPT Acute Rehabilitation Services Office [phone]  10/04/22 3:46 PM

## 2022-10-04 NOTE — Progress Notes (Signed)
Subjective:  Dysnpea at baseline some chest pain this am more pleuritic but Bothering him   Objective:  Vitals:   10/04/22 0429 10/04/22 0500 10/04/22 0624 10/04/22 0625  BP: 124/67     Pulse: 71     Resp: 20     Temp: 97.6 F (36.4 C)     TempSrc: Oral     SpO2: 97%  99% 99%  Weight:  79.9 kg    Height:        Intake/Output from previous day:  Intake/Output Summary (Last 24 hours) at 10/04/2022 0720 Last data filed at 10/04/2022 0500 Gross per 24 hour  Intake 529.11 ml  Output 1450 ml  Net -920.89 ml    Physical Exam: Elderlly male from [place] Atelectasis base No murmur  No edema PPM under left clavicle  Abdomen benign  Bruising on shins   Lab Results: Basic Metabolic Panel: Recent Labs    10/02/22 0542 10/04/22 0448  NA 141 138  K 4.2 3.7  CL 103 104  CO2 27 30  GLUCOSE 164* 128*  BUN 19 22  CREATININE 0.85 0.72  CALCIUM 8.9 8.4*  MG  --  2.5*  PHOS  --  2.6   Liver Function Tests: Recent Labs    10/01/22 1428 10/02/22 0542  AST 37 24  ALT 15 17  ALKPHOS 75 64  BILITOT 1.4* 1.0  PROT 7.2 6.9  ALBUMIN 3.8 3.7   No results for input(s): "LIPASE", "AMYLASE" in the last 72 hours. CBC: Recent Labs    10/01/22 1255 10/02/22 0542 10/03/22 0450 10/04/22 0448  WBC 9.8   < > 7.2 9.1  NEUTROABS 7.5  --   --   --   HGB 14.2   < > 12.2* 12.5*  HCT 43.4   < > 36.3* 38.5*  MCV 96.4   < > 94.8 97.0  PLT 198   < > 206 210   < > = values in this interval not displayed.   Cardiac Enzymes: No results for input(s): "CKTOTAL", "CKMB", "CKMBINDEX", "TROPONINI" in the last 72 hours. BNP: Invalid input(s): "POCBNP" D-Dimer: Recent Labs    10/02/22 0851  DDIMER 0.45    Thyroid Function Tests: Recent Labs    10/01/22 2112 10/03/22 0450  TSH 0.285*  --   T3FREE  --  1.5*   Anemia Panel: Recent Labs    10/01/22 2112  FERRITIN 101    Imaging: ECHOCARDIOGRAM COMPLETE  Result Date: 10/03/2022    ECHOCARDIOGRAM REPORT   Patient  Name:   CHARVIS LIGHTNER Sr. Date of Exam: 10/03/2022 Medical Rec #:  [id number]         Height:       69.0 in Accession #:    [id number]        Weight:       171.5 lb Date of Birth:  Mar 21, 1941          BSA:          1.935 m Patient Age:    81 years          BP:           135/86 mmHg Patient Gender: M                 HR:           72 bpm. Exam Location:  Inpatient Procedure: 2D Echo Indications:    CHF  History:        Patient has prior  history of Echocardiogram examinations, most                 recent 07/21/2022. Cardiomyopathy, CAD, Pacemaker, COPD,                 Arrythmias:Atrial Fibrillation; Risk Factors:Diabetes and                 Hypertension.  Sonographer:    Cathie Hoops Referring Phys: [phone] Wendall Stade  Sonographer Comments: Technically difficult study due to poor echo windows. Image acquisition challenging due to COPD. IMPRESSIONS  1. Left ventricular ejection fraction, by estimation, is 55 to 60%. The left ventricle has normal function. Left ventricular endocardial border not optimally defined to evaluate regional wall motion. Left ventricular diastolic parameters are indeterminate.  2. Right ventricular systolic function is mildly reduced. The right ventricular size is normal. There is moderately elevated pulmonary artery systolic pressure. The estimated right ventricular systolic pressure is 50.8 mmHg.  3. Left atrial size was mildly dilated.  4. Right atrial size was mildly dilated.  5. The mitral valve is degenerative. Trivial mitral valve regurgitation. No evidence of mitral stenosis.  6. The aortic valve is tricuspid. There is mild calcification of the aortic valve. There is mild thickening of the aortic valve. Aortic valve regurgitation is not visualized. No aortic stenosis is present.  7. The inferior vena cava is normal in size with <50% respiratory variability, suggesting right atrial pressure of 8 mmHg. Conclusion(s)/Recommendation(s): Consider limited echocardiogram with Definity contrast  to better assess regional wall motion if clinically relevant. FINDINGS  Left Ventricle: Left ventricular ejection fraction, by estimation, is 55 to 60%. The left ventricle has normal function. Left ventricular endocardial border not optimally defined to evaluate regional wall motion. The left ventricular internal cavity size was normal in size. There is no left ventricular hypertrophy. Left ventricular diastolic parameters are indeterminate. Right Ventricle: The right ventricular size is normal. No increase in right ventricular wall thickness. Right ventricular systolic function is mildly reduced. There is moderately elevated pulmonary artery systolic pressure. The tricuspid regurgitant velocity is 3.27 m/s, and with an assumed right atrial pressure of 8 mmHg, the estimated right ventricular systolic pressure is 50.8 mmHg. Left Atrium: Left atrial size was mildly dilated. Right Atrium: Right atrial size was mildly dilated. Pericardium: There is no evidence of pericardial effusion. Mitral Valve: The mitral valve is degenerative in appearance. Mild mitral annular calcification. Trivial mitral valve regurgitation. No evidence of mitral valve stenosis. Tricuspid Valve: The tricuspid valve is normal in structure. Tricuspid valve regurgitation is mild . No evidence of tricuspid stenosis. Aortic Valve: The aortic valve is tricuspid. There is mild calcification of the aortic valve. There is mild thickening of the aortic valve. Aortic valve regurgitation is not visualized. No aortic stenosis is present. Aortic valve mean gradient measures 4.0 mmHg. Aortic valve peak gradient measures 7.7 mmHg. Aortic valve area, by VTI measures 1.91 cm. Pulmonic Valve: The pulmonic valve was normal in structure. Pulmonic valve regurgitation is trivial. No evidence of pulmonic stenosis. Aorta: The aortic root is normal in size and structure. Venous: The inferior vena cava is normal in size with less than 50% respiratory variability,  suggesting right atrial pressure of 8 mmHg. IAS/Shunts: The atrial septum is grossly normal.  LEFT VENTRICLE PLAX 2D LVIDd:         4.70 cm     Diastology LVIDs:         3.20 cm     LV e' medial:  11.10 cm/s LV PW:         0.80 cm     LV E/e' medial:  8.6 LV IVS:        0.80 cm     LV e' lateral:   10.70 cm/s LVOT diam:     2.20 cm     LV E/e' lateral: 8.9 LV SV:         45 LV SV Index:   23 LVOT Area:     3.80 cm  LV Volumes (MOD) LV vol d, MOD A2C: 74.9 ml LV vol d, MOD A4C: 63.6 ml LV vol s, MOD A2C: 25.6 ml LV vol s, MOD A4C: 24.2 ml LV SV MOD A2C:     49.3 ml LV SV MOD A4C:     63.6 ml LV SV MOD BP:      42.9 ml RIGHT VENTRICLE RV Basal diam:  4.00 cm RV Mid diam:    3.40 cm RV S prime:     12.60 cm/s TAPSE (M-mode): 2.4 cm LEFT ATRIUM             Index        RIGHT ATRIUM           Index LA diam:        4.20 cm 2.17 cm/m   RA Area:     22.10 cm LA Vol (A2C):   81.1 ml 41.90 ml/m  RA Volume:   56.50 ml  29.19 ml/m LA Vol (A4C):   75.4 ml 38.96 ml/m LA Biplane Vol: 82.1 ml 42.42 ml/m  AORTIC VALVE                    PULMONIC VALVE AV Area (Vmax):    1.80 cm     PV Vmax:          0.87 m/s AV Area (Vmean):   1.83 cm     PV Peak grad:     3.0 mmHg AV Area (VTI):     1.91 cm     PR End Diast Vel: 7.62 msec AV Vmax:           139.00 cm/s AV Vmean:          98.300 cm/s AV VTI:            0.237 m AV Peak Grad:      7.7 mmHg AV Mean Grad:      4.0 mmHg LVOT Vmax:         65.90 cm/s LVOT Vmean:        47.300 cm/s LVOT VTI:          0.119 m LVOT/AV VTI ratio: 0.50  AORTA Ao Root diam: 3.30 cm Ao Asc diam:  3.50 cm MITRAL VALVE               TRICUSPID VALVE MV Area (PHT): 4.39 cm    TR Peak grad:   42.8 mmHg MV Decel Time: 173 msec    TR Vmax:        327.00 cm/s MR Peak grad: 72.1 mmHg MR Vmax:      424.50 cm/s  SHUNTS MV E velocity: 95.10 cm/s  Systemic VTI:  0.12 m MV A velocity: 26.60 cm/s  Systemic Diam: 2.20 cm MV E/A ratio:  3.58 Weston Brass MD Electronically signed by Weston Brass MD Signature  Date/Time: 10/03/2022/11:55:23 AM    Final    CT Angio Chest Pulmonary Embolism (PE) W or WO Contrast  Result Date: 10/02/2022 CLINICAL DATA:  Short of breath. 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 EXAM: CT ANGIOGRAPHY CHEST WITH CONTRAST TECHNIQUE: Multidetector CT imaging of the chest was performed using the standard protocol during bolus administration of intravenous contrast. Multiplanar CT image reconstructions and MIPs were obtained to evaluate the vascular anatomy. RADIATION DOSE REDUCTION: This exam was performed according to the departmental dose-optimization program which includes automated exposure control, adjustment of the mA and/or kV according to patient size and/or use of iterative reconstruction technique. CONTRAST:  OMNIPAQUE IOHEXOL 350 MG/ML SOLN COMPARISON:  07/30/2021. FINDINGS: Cardiovascular: Suboptimal opacification of the pulmonary arteries limits assessment of the segmental and smaller vessels for the presence of pulmonary emboli. Allowing for this limitation, there is no evidence of a large or central pulmonary embolism. Heart is mildly enlarged. Three-vessel coronary artery calcifications. No pericardial effusion. Great vessels are normal in caliber. No aortic dissection. Mild aortic atherosclerosis. Arch branch vessels are widely patent. Mediastinum/Nodes: No neck base, mediastinal or hilar masses. Mildly enlarged right subcarinal lymph node, 1.4 cm in short axis, less prominent than on the prior CT. Trachea esophagus are unremarkable. Lungs/Pleura: Patchy opacity noted at the base of the right lower lobe. Mild bilateral interstitial thickening most evident in the lower lungs. Mild dependent atelectasis or scarring in the left upper lobe lingula and in the anterior inferior right upper lobe. Mild mosaic perfusion bilaterally suggest small airways disease/air trapping. No pleural effusion or pneumothorax. Upper Abdomen: No acute findings. Musculoskeletal: No fracture or  acute finding. No bone lesion. No chest wall mass. Review of the MIP images confirms the above findings. IMPRESSION: 1. Suboptimal study. No evidence of a large or central pulmonary embolism. 2. Patchy opacity at the base of the right lower lobe may be due to atelectasis or pneumonia. 3. Mosaic perfusion in the lungs is consistent small airways disease and air trapping, similar to the appearance on the prior chest CT. 4. No evidence of pulmonary edema. 5. Cardiomegaly and three-vessel coronary artery calcifications. Mild aortic atherosclerosis. Aortic Atherosclerosis (ICD10-I70.0). Electronically Signed   By: Amie Portland M.D.   On: 10/02/2022 12:49    Cardiac Studies:  ECG: Afib V pacing    Telemetry: Afib V pacing   Echo: pending   Medications:    aspirin EC  81 mg Oral q morning   atorvastatin  20 mg Oral QPM   budesonide  0.5 mg Nebulization BID   calcium-vitamin D  1 tablet Oral q morning   furosemide  40 mg Oral Daily   guaiFENesin  600 mg Oral BID   ipratropium-albuterol  3 mL Nebulization BID   pantoprazole  40 mg Oral QAC breakfast   predniSONE  40 mg Oral Q breakfast   tamsulosin  0.4 mg Oral QPC supper      cefTRIAXone (ROCEPHIN)  IV 1 g (10/03/22 1034)   heparin 1,500 Units/hr (10/04/22 0046)    Assessment/Plan:   Elevated Troponin:  now with chest pain and recurrent admissions for dyspnea. 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 However discussed with patient utility of right and left heart cath to r/o CAD and assess filling pressures. Risks including stroke, bleeding, need for surgery and contrast reaction discussed willing to proceed Orders written and placed on schedule at [facility] NPO after midnight Conitnue heparin ASA/statin  2.  Afib:  chronic with AV node ablation and PPM back up Resume home   [place]  3.  HpPEF:  continue lasix TTE with normal  EF no valve dx Diastolic parameters indeterminate due to afib. IVC flat BNP minimally  elevated not volume overloaded on exam ? Capillary leak syndrome with COVID CT chest suggests RLL atelectasis/pneumonia with mosaic perfusion in lungs consistent with small airway dx and air trapping  4.  PPM:  normal function f/u GT needs battery change in next month   Charlton Haws 10/04/2022, 7:20 AM

## 2022-10-04 NOTE — Telephone Encounter (Signed)
He can be seen by me or a PA prior to gen change out.

## 2022-10-04 NOTE — Progress Notes (Signed)
[facility]                                                                               Bradley [name], is a 81 y.o. male, DOB - 1941-06-01, VWU:[id number] Admit date - 10/01/2022    Outpatient Primary MD for the patient is [name], MD  LOS - 3  days    Brief summary   Bradley NICHELSON Sr. is a 81 y.o. male with medical history significant of  history of anemia, osteoarthritis, chronic atrial fibrillation status post AV nodal ablation and biventricular pacemaker, BPH, bronchitis, CAD, chronic diastolic heart failure on 5L O2at home,pulmonary hypertension, COPD, chronic respiratory failure, hyperlipidemia, hypertension, diabetes mellitus type 2, intracranial bleed, migraine headache, insomnia, pleural effusions.  Patient also has interim history of admission  08/27/22-08/30/22 with discharge diagnosis of CAP/COPD exacerbation  in setting of COVID infection diagnosed 08/23/22. Patient was treated  antibiotics  as well as steroids for COPD exacerbation with good response and was discharged home.  Patient now return to ED close to one month later with complaints of progressive DOE over the last 1 week associated with central chest discomfort improved with rest.    Assessment & Plan    Assessment and Plan:  Acute COPD exacerbation with acute on chronic hypoxic respiratory failure  Wheezing has improved. He remains on 4 Lit of [place] oxygen.  Improving,  no wheezing on exam. He continues to report PND, waking up in the middle of the night with some pressures mid sternal and radiating to his shoulders.  Continue with prednisone taper.  CT chest shows  Patchy opacity at the base of the right lower lobe may be due to atelectasis or pneumonia. Mosaic perfusion in the lungs is consistent small airways disease and air trapping, similar to the appearance on the prior chest CT. Recommend to continue with rocephin and  Continue with duonebs.     Persistent +COVID-19  -1st +  08/23/22 -on steroids.     CHF/ elevated troponins.  Demand ischemia from CHF. He continues to report PND, waking up in the middle of the night with some pressures mid sternal and radiating to his shoulders.  Cardiology on board.  Echocardiogram done and reviewed. He is scheduled for cardiac cath in am.    Anemia of chronic disease:  Continue to monitor.    Chronic atrial fibrillation with AV node ablation   S/p PPM  - plan to change the battery next month.      Pulmonary Hypertension Follow up echocardiogram.     Hypertension;  Optimal BP parameters.       Hx of ICH -no active issues    DMII CBG (last 3)  Resume SSI.  CBG (last 3)  Recent Labs    10/02/22 0743  GLUCAP 162*      BPH -continue flomax    Hyperlipidemia:  Resume statin.    Estimated body mass index is 26.01 kg/m as calculated from the following:   Height as of this encounter:  (1.753 m).   Weight as of this encounter: 79.9 kg.  Code Status: full code.  DVT Prophylaxis:     Level of  Care: Level of care: Progressive Family Communication: none at bedside.   Disposition Plan:      Procedures:  None.   Consultants:   Cardiology.   Antimicrobials:   Anti-infectives (From admission, onward)    Start     Dose/Rate Route Frequency Ordered Stop   10/01/22 2045  cefTRIAXone (ROCEPHIN) 1 g in sodium chloride 0.9 % 100 mL IVPB        1 g 200 mL/hr over 30 Minutes Intravenous Daily 10/01/22 2022 10/06/22 0959        Medications  Scheduled Meds:  aspirin EC  81 mg Oral q morning   atorvastatin  20 mg Oral QPM   bisoprolol  5 mg Oral Daily   budesonide  0.5 mg Nebulization BID   calcium-vitamin D  1 tablet Oral q morning   furosemide  40 mg Oral Daily   guaiFENesin  600 mg Oral BID   ipratropium-albuterol  3 mL Nebulization BID   pantoprazole  40 mg Oral QAC breakfast   predniSONE  40 mg Oral Q breakfast   sodium chloride flush  3 mL Intravenous Q12H   tamsulosin  0.4  mg Oral QPC supper   Continuous Infusions:  cefTRIAXone (ROCEPHIN)  IV 1 g (10/04/22 0824)   heparin 1,500 Units/hr (10/04/22 0046)   PRN Meds:.acetaminophen, levalbuterol, ondansetron **OR** ondansetron (ZOFRAN) IV    Subjective:   Bradley [name] was seen and examined today. No new complaints today.   Objective:   Vitals:   10/04/22 0500 10/04/22 0624 10/04/22 0625 10/04/22 1342  BP:    119/68  Pulse:    97  Resp:    18  Temp:    99.1 F (37.3 C)  TempSrc:    Oral  SpO2:  99% 99% 93%  Weight: 79.9 kg     Height:        Intake/Output Summary (Last 24 hours) at 10/04/2022 1552 Last data filed at 10/04/2022 1342 Gross per 24 hour  Intake 659.95 ml  Output 2350 ml  Net -1690.05 ml    Filed Weights   10/01/22 1920 10/03/22 0500 10/04/22 0500  Weight: 77.1 kg 77.8 kg 79.9 kg     Exam General exam: Appears calm and comfortable  Respiratory system: Clear to auscultation. Respiratory effort normal. Cardiovascular system: S1 & S2 heard, RRR. No JVD,  Gastrointestinal system: Abdomen is nondistended, soft and nontender.  Central nervous system: Alert and oriented. No focal neurological deficits. Extremities: Symmetric 5 x 5 power. Skin: No rashes, lesions or ulcers Psychiatry:  Mood & affect appropriate.       Data Reviewed:  I have personally reviewed following labs and imaging studies   CBC Lab Results  Component Value Date   WBC 9.1 10/04/2022   RBC 3.97 (L) 10/04/2022   HGB 12.5 (L) 10/04/2022   HCT 38.5 (L) 10/04/2022   MCV 97.0 10/04/2022   MCH 31.5 10/04/2022   PLT 210 10/04/2022   MCHC 32.5 10/04/2022   RDW 14.8 10/04/2022   LYMPHSABS 1.7 10/01/2022   MONOABS 0.6 10/01/2022   EOSABS 0.0 10/01/2022   BASOSABS 0.0 10/01/2022     Last metabolic panel Lab Results  Component Value Date   NA 138 10/04/2022   K 3.7 10/04/2022   CL 104 10/04/2022   CO2 30 10/04/2022   BUN 22 10/04/2022   CREATININE 0.72 10/04/2022   GLUCOSE 128 (H) 10/04/2022    GFRNONAA >60 10/04/2022   GFRAA >60 09/18/2019   CALCIUM 8.4 (L) 10/04/2022  PHOS 2.6 10/04/2022   PROT 6.9 10/02/2022   ALBUMIN 3.7 10/02/2022   BILITOT 1.0 10/02/2022   ALKPHOS 64 10/02/2022   AST 24 10/02/2022   ALT 17 10/02/2022   ANIONGAP 4 (L) 10/04/2022    CBG (last 3)  Recent Labs    10/02/22 0743  GLUCAP 162*       Coagulation Profile: No results for input(s): "INR", "PROTIME" in the last 168 hours.   Radiology Studies: ECHOCARDIOGRAM COMPLETE  Result Date: 10/03/2022    ECHOCARDIOGRAM REPORT   Patient Name:   Bradley TADROS Sr. Date of Exam: 10/03/2022 Medical Rec #:  [id number]         Height:       69.0 in Accession #:    [id number]        Weight:       171.5 lb Date of Birth:  [birth year]          BSA:          1.935 m Patient Age:    81 years          BP:           135/86 mmHg Patient Gender: M                 HR:           72 bpm. Exam Location:  Inpatient Procedure: 2D Echo Indications:    CHF  History:        Patient has prior history of Echocardiogram examinations, most                 recent 07/21/2022. Cardiomyopathy, CAD, Pacemaker, COPD,                 Arrythmias:Atrial Fibrillation; Risk Factors:Diabetes and                 Hypertension.  Sonographer:    Cathie Hoops Referring Phys: [phone] Wendall Stade  Sonographer Comments: Technically difficult study due to poor echo windows. Image acquisition challenging due to COPD. IMPRESSIONS  1. Left ventricular ejection fraction, by estimation, is 55 to 60%. The left ventricle has normal function. Left ventricular endocardial border not optimally defined to evaluate regional wall motion. Left ventricular diastolic parameters are indeterminate.  2. Right ventricular systolic function is mildly reduced. The right ventricular size is normal. There is moderately elevated pulmonary artery systolic pressure. The estimated right ventricular systolic pressure is 50.8 mmHg.  3. Left atrial size was mildly dilated.  4. Right atrial  size was mildly dilated.  5. The mitral valve is degenerative. Trivial mitral valve regurgitation. No evidence of mitral stenosis.  6. The aortic valve is tricuspid. There is mild calcification of the aortic valve. There is mild thickening of the aortic valve. Aortic valve regurgitation is not visualized. No aortic stenosis is present.  7. The inferior vena cava is normal in size with <50% respiratory variability, suggesting right atrial pressure of 8 mmHg. Conclusion(s)/Recommendation(s): Consider limited echocardiogram with Definity contrast to better assess regional wall motion if clinically relevant. FINDINGS  Left Ventricle: Left ventricular ejection fraction, by estimation, is 55 to 60%. The left ventricle has normal function. Left ventricular endocardial border not optimally defined to evaluate regional wall motion. The left ventricular internal cavity size was normal in size. There is no left ventricular hypertrophy. Left ventricular diastolic parameters are indeterminate. Right Ventricle: The right ventricular size is normal. No increase in right ventricular wall thickness. Right ventricular systolic function  is mildly reduced. There is moderately elevated pulmonary artery systolic pressure. The tricuspid regurgitant velocity is 3.27 m/s, and with an assumed right atrial pressure of 8 mmHg, the estimated right ventricular systolic pressure is 50.8 mmHg. Left Atrium: Left atrial size was mildly dilated. Right Atrium: Right atrial size was mildly dilated. Pericardium: There is no evidence of pericardial effusion. Mitral Valve: The mitral valve is degenerative in appearance. Mild mitral annular calcification. Trivial mitral valve regurgitation. No evidence of mitral valve stenosis. Tricuspid Valve: The tricuspid valve is normal in structure. Tricuspid valve regurgitation is mild . No evidence of tricuspid stenosis. Aortic Valve: The aortic valve is tricuspid. There is mild calcification of the aortic valve.  There is mild thickening of the aortic valve. Aortic valve regurgitation is not visualized. No aortic stenosis is present. Aortic valve mean gradient measures 4.0 mmHg. Aortic valve peak gradient measures 7.7 mmHg. Aortic valve area, by VTI measures 1.91 cm. Pulmonic Valve: The pulmonic valve was normal in structure. Pulmonic valve regurgitation is trivial. No evidence of pulmonic stenosis. Aorta: The aortic root is normal in size and structure. Venous: The inferior vena cava is normal in size with less than 50% respiratory variability, suggesting right atrial pressure of 8 mmHg. IAS/Shunts: The atrial septum is grossly normal.  LEFT VENTRICLE PLAX 2D LVIDd:         4.70 cm     Diastology LVIDs:         3.20 cm     LV e' medial:    11.10 cm/s LV PW:         0.80 cm     LV E/e' medial:  8.6 LV IVS:        0.80 cm     LV e' lateral:   10.70 cm/s LVOT diam:     2.20 cm     LV E/e' lateral: 8.9 LV SV:         45 LV SV Index:   23 LVOT Area:     3.80 cm  LV Volumes (MOD) LV vol d, MOD A2C: 74.9 ml LV vol d, MOD A4C: 63.6 ml LV vol s, MOD A2C: 25.6 ml LV vol s, MOD A4C: 24.2 ml LV SV MOD A2C:     49.3 ml LV SV MOD A4C:     63.6 ml LV SV MOD BP:      42.9 ml RIGHT VENTRICLE RV Basal diam:  4.00 cm RV Mid diam:    3.40 cm RV S prime:     12.60 cm/s TAPSE (M-mode): 2.4 cm LEFT ATRIUM             Index        RIGHT ATRIUM           Index LA diam:        4.20 cm 2.17 cm/m   RA Area:     22.10 cm LA Vol (A2C):   81.1 ml 41.90 ml/m  RA Volume:   56.50 ml  29.19 ml/m LA Vol (A4C):   75.4 ml 38.96 ml/m LA Biplane Vol: 82.1 ml 42.42 ml/m  AORTIC VALVE                    PULMONIC VALVE AV Area (Vmax):    1.80 cm     PV Vmax:          0.87 m/s AV Area (Vmean):   1.83 cm     PV Peak grad:     3.0  mmHg AV Area (VTI):     1.91 cm     PR End Diast Vel: 7.62 msec AV Vmax:           139.00 cm/s AV Vmean:          98.300 cm/s AV VTI:            0.237 m AV Peak Grad:      7.7 mmHg AV Mean Grad:      4.0 mmHg LVOT Vmax:          65.90 cm/s LVOT Vmean:        47.300 cm/s LVOT VTI:          0.119 m LVOT/AV VTI ratio: 0.50  AORTA Ao Root diam: 3.30 cm Ao Asc diam:  3.50 cm MITRAL VALVE               TRICUSPID VALVE MV Area (PHT): 4.39 cm    TR Peak grad:   42.8 mmHg MV Decel Time: 173 msec    TR Vmax:        327.00 cm/s MR Peak grad: 72.1 mmHg MR Vmax:      424.50 cm/s  SHUNTS MV E velocity: 95.10 cm/s  Systemic VTI:  0.12 m MV A velocity: 26.60 cm/s  Systemic Diam: 2.20 cm MV E/A ratio:  3.58 Weston Brass MD Electronically signed by Weston Brass MD Signature Date/Time: 10/03/2022/11:55:23 AM    Final        Kathlen Mody M.D. [facility] 10/04/2022, 3:52 PM  Available via Epic secure chat 7am-7pm After 7 pm, please refer to night coverage provider listed on amion.

## 2022-10-04 NOTE — Progress Notes (Signed)
ANTICOAGULATION CONSULT NOTE - Follow up Consult  Pharmacy Consult for IV heparin Indication: chest pain/ACS  Allergies  Allergen Reactions   Hydrocodone Other (See Comments)    "given to him in the hospital; went thru withdrawals once home; dr said not to take it again" (09/27/2012) dizziness   Ultram [Tramadol] Other (See Comments)    "given to him in the hospital; went thru withdrawals once home; dr said not to take it again" (09/27/2012) dizziness   Cialis [Tadalafil] Other (See Comments)    Headache Backache  Dizziness    Avelox [Moxifloxacin Hydrochloride] Itching and Other (See Comments)    Headache Dizziness    Patient Measurements: Height: 5\' 9"  (175.3 cm) Weight: 79.9 kg (176 lb 2.4 oz) IBW/kg (Calculated) : 70.7 Heparin Dosing Weight: TBW  Vital Signs: Temp: 97.6 F (36.4 C) (12/25 0429) Temp Source: Oral (12/25 0429) BP: 124/67 (12/25 0429) Pulse Rate: 71 (12/25 0429)  Labs: Recent Labs    10/01/22 1428 10/02/22 0542 10/02/22 0851 10/02/22 1043 10/02/22 1442 10/03/22 0450 10/03/22 1113 10/03/22 1856 10/04/22 0448  HGB  --  13.3  --   --   --  12.2*  --   --  12.5*  HCT  --  39.4  --   --   --  36.3*  --   --  38.5*  PLT  --  223  --   --   --  206  --   --  210  HEPARINUNFRC  --  0.14*  --   --    < > 0.23* 0.41 0.47 0.50  CREATININE 0.71 0.85  --   --   --   --   --   --  0.72  TROPONINIHS 685*  --  218* 216*  --   --   --   --   --    < > = values in this interval not displayed.     Estimated Creatinine Clearance: 72.4 mL/min (by C-G formula based on SCr of 0.72 mg/dL).   Medical History: Past Medical History:  Diagnosis Date   Abnormal CT scan, chest 2012   CT chest, several lymphadenopathies. Sees pulmonary   Anemia    intermittent   Arthritis    "?back" (09/19/2018)   Atrial fibrillation (HCC)    s/p AV node ablation & BiV PPM implantation 09/08/11 (op dictation pending)   BPH (benign prostatic hyperplasia)    Saw Dr 09/10/11  2004, normal renal u/s   Bronchitis 08/26/2017   CAD (coronary artery disease)    CHF (congestive heart failure) (HCC)    Thought primarily to be non-systolic although EF down (EF 08/28/2017 12/2010, down to 35-40% 09/05/11), cath 2008 with no CAD, nuclear study 07/2011 showing Small area of reversibility in the distal ant/lat wall the left ventricle suspicious for ischemia/septal wall HK but felt to be low risk  (per D/C Summary 07/2011)   Chronic bronchitis (HCC)    "get it ~ q yr"   Chronic diastolic heart failure (HCC)         Chronic respiratory failure with hypoxia and hypercapnia (HCC) 09/14/2010   Followed in Pulmonary clinic/ [facility]/ [facility]  Started on 02 2lpm at discharge 09/23/11   - PFT's 10/28/2011  FEV1  1.40 (51%)  with ratio 70 and no better p B2 and DLCO 53 corrects to 101    - PFT's 10/18/2014    FEV1 1.71 (59%) with ratio 68 and no sign change p B2 and DLCO  53 corrects to 85  -  HC03   07/28/20  = 33  -  HCO3   03/17/21     = 31     Heart murmur    HLD (hyperlipidemia)    HTN (hypertension)    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   Pleural effusion 2008   S/p decortication   Pneumonia 08/02/2011   Pulmonary HTN (HCC)    per cath 2008   Type II diabetes mellitus (HCC) 1999    Medications:  Medications Prior to Admission  Medication Sig Dispense Refill Last Dose   acetaminophen (TYLENOL) 500 MG tablet Take 1,000 mg by mouth every 8 (eight) hours as needed for headache or moderate pain.   Past Week   albuterol (VENTOLIN HFA) 108 (90 Base) MCG/ACT inhaler Inhale 2 puffs into the lungs every 6 (six) hours as needed for wheezing or shortness of breath. (Patient taking differently: Inhale 1 puff into the lungs 2 (two) times daily.) 8.5 g 0 10/02/2022   amLODipine (NORVASC) 10 MG tablet Take 1  tablet (10 mg total) by mouth daily. (Patient taking differently: Take 10 mg by mouth in the morning.) 90 tablet 1 10/02/2022   aspirin 81 MG tablet Take 81 mg by mouth every morning.    10/02/2022   atorvastatin (LIPITOR) 20 MG tablet Take 1 tablet (20 mg total) by mouth at bedtime. (Patient taking differently: Take 20 mg by mouth every evening.) 90 tablet 1 10/01/2022   bisoprolol (ZEBETA) 10 MG tablet Take 1 tablet (10 mg total) by mouth daily. 90 tablet 0 10/02/2022   diphenhydramine-acetaminophen (TYLENOL PM) 25-500 MG TABS tablet Take 1 tablet by mouth at bedtime as needed (sleep).   Past Week   FARXIGA 10 MG TABS tablet TAKE 1 TABLET DAILY BEFORE BREAKFAST (Patient taking differently: Take 10 mg by mouth in the morning.) 90 tablet 3 10/02/2022   furosemide (LASIX) 40 MG tablet TAKE 2 TABLETS EVERY MORNING AND 1 TABLET LATE IN THE AFTERNOON (Patient taking differently: Take 40 mg by mouth in the morning, at noon, and at bedtime.) 270 tablet 3 10/02/2022   ipratropium-albuterol (DUONEB) 0.5-2.5 (3) MG/3ML SOLN Take 3 mLs by nebulization every 6 (six) hours as needed. (Patient taking differently: Take 3 mLs by nebulization in the morning and at bedtime.) 360 mL 1 10/02/2022   nitroGLYCERIN (NITROSTAT) 0.4 MG SL tablet Place 1 tablet (0.4 mg total) under the tongue every 5 (five) minutes x 3 doses as needed for chest pain. 25 tablet 3 unknown   pantoprazole (PROTONIX) 40 MG tablet Take 1 tablet (40 mg total) by mouth daily before breakfast. 90 tablet 0 10/02/2022   tamsulosin (FLOMAX) 0.4 MG CAPS capsule Take 1 capsule (0.4 mg total) by mouth daily after supper. 90 capsule 1 10/01/2022   budesonide (PULMICORT) 0.5 MG/2ML nebulizer solution Take 2 mLs (0.5 mg total) by nebulization 2 (two) times daily. (Patient not taking: Reported on 10/02/2022) 360 mL 1 Completed Course   calcium-vitamin D (OSCAL WITH D) 500-200 MG-UNIT tablet Take 1 tablet by mouth every morning.   unknown   cholecalciferol  (VITAMIN D3) 25 MCG (1000 UNIT) tablet Take 1,000 Units by mouth every evening.   unknown   guaiFENesin (MUCINEX) 600 MG 12 hr  tablet Take 600 mg by mouth 2 (two) times daily.   unknown   Lancets (ONETOUCH ULTRASOFT) lancets Check blood sugars no more than twice daily 200 each 12    Multiple Vitamins-Minerals (MULTIVITAMINS THER. W/MINERALS) TABS tablet Take 1 tablet by mouth daily. (Patient not taking: Reported on 10/02/2022) 30 each 5 Completed Course   ONETOUCH VERIO test strip USE TO CHECK BLOOD SUGAR NO MORE THAN TWICE A DAY 200 strip 12    Scheduled:   aspirin EC  81 mg Oral q morning   atorvastatin  20 mg Oral QPM   bisoprolol  5 mg Oral Daily   budesonide  0.5 mg Nebulization BID   calcium-vitamin D  1 tablet Oral q morning   furosemide  40 mg Oral Daily   guaiFENesin  600 mg Oral BID   ipratropium-albuterol  3 mL Nebulization BID   pantoprazole  40 mg Oral QAC breakfast   predniSONE  40 mg Oral Q breakfast   sodium chloride flush  3 mL Intravenous Q12H   tamsulosin  0.4 mg Oral QPC supper   PRN:   Assessment: 69 yoM with PMH CAD, Afib with pacemaker but not on anticoagulation, PVD, Pulm HTN, CHF, COPD on 4L O2 at baseline, presenting 12/22 with SOB, CP, cough. Found to have elevated troponins; Cardiology recommending short term heparin infusion per Pharmacy.  Prior anticoagulation: none  Significant events: -IV access lost 12/23 pm - heparin resumed ~ 0030 on 12/24  Today, 10/04/2022: Heparin level 0.50 - remains therapeutic with heparin infusion at 1500 units/hr Hgb/Pltc: WNL No bleeding or infusion issues noted  Goal of Therapy: Heparin level 0.3-0.7 units/ml Monitor platelets by anticoagulation protocol: Yes  Plan: Continue heparin infusion at 1500 units/hr  Daily CBC, heparin level Monitor for signs of bleeding   Pricilla Riffle, PharmD, BCPS Clinical Pharmacist 10/04/2022 7:32 AM

## 2022-10-05 ENCOUNTER — Encounter (HOSPITAL_COMMUNITY)
Admission: EM | Disposition: A | Discharge status: Home / Self Care | Payer: Self-pay | Source: Home / Self Care | Attending: Internal Medicine

## 2022-10-05 DIAGNOSIS — U071 COVID-19: Secondary | ICD-10-CM | POA: Diagnosis not present

## 2022-10-05 DIAGNOSIS — R7989 Other specified abnormal findings of blood chemistry: Secondary | ICD-10-CM | POA: Diagnosis not present

## 2022-10-05 DIAGNOSIS — I509 Heart failure, unspecified: Secondary | ICD-10-CM

## 2022-10-05 DIAGNOSIS — I214 Non-ST elevation (NSTEMI) myocardial infarction: Secondary | ICD-10-CM | POA: Diagnosis not present

## 2022-10-05 DIAGNOSIS — I5043 Acute on chronic combined systolic (congestive) and diastolic (congestive) heart failure: Secondary | ICD-10-CM

## 2022-10-05 DIAGNOSIS — J1282 Pneumonia due to coronavirus disease 2019: Secondary | ICD-10-CM | POA: Diagnosis not present

## 2022-10-05 HISTORY — PX: RIGHT/LEFT HEART CATH AND CORONARY ANGIOGRAPHY: CATH118266

## 2022-10-05 LAB — BASIC METABOLIC PANEL
Anion gap: 6 (ref 5–15)
BUN: 16 mg/dL (ref 8–23)
CO2: 28 mmol/L (ref 22–32)
Calcium: 8.7 mg/dL — ABNORMAL LOW (ref 8.9–10.3)
Chloride: 104 mmol/L (ref 98–111)
Creatinine, Ser: 0.55 mg/dL — ABNORMAL LOW (ref 0.61–1.24)
GFR, Estimated: >60 mL/min (ref >60)
Glucose, Bld: 105 mg/dL — ABNORMAL HIGH (ref 70–99)
Potassium: 3.9 mmol/L (ref 3.5–5.1)
Sodium: 138 mmol/L (ref 135–145)

## 2022-10-05 LAB — CBC
HCT: 39.6 % (ref 39.0–52.0)
Hemoglobin: 13.2 g/dL (ref 13.0–17.0)
MCH: 31.2 pg (ref 26.0–34.0)
MCHC: 33.3 g/dL (ref 30.0–36.0)
MCV: 93.6 fL (ref 80.0–100.0)
Platelets: 225 10*3/uL (ref 150–400)
RBC: 4.23 MIL/uL (ref 4.22–5.81)
RDW: 14.3 % (ref 11.5–15.5)
WBC: 6.9 10*3/uL (ref 4.0–10.5)
nRBC: 0 % (ref 0.0–0.2)

## 2022-10-05 LAB — GLUCOSE, CAPILLARY
Glucose-Capillary: 119 mg/dL — ABNORMAL HIGH (ref 70–99)
Glucose-Capillary: 140 mg/dL — ABNORMAL HIGH (ref 70–99)
Glucose-Capillary: 194 mg/dL — ABNORMAL HIGH (ref 70–99)

## 2022-10-05 LAB — HEPARIN LEVEL (UNFRACTIONATED): Heparin Unfractionated: 0.64 IU/mL (ref 0.30–0.70)

## 2022-10-05 SURGERY — RIGHT/LEFT HEART CATH AND CORONARY ANGIOGRAPHY
Anesthesia: LOCAL

## 2022-10-05 MED ORDER — SODIUM CHLORIDE 0.9 % WEIGHT BASED INFUSION
1.0000 mL/kg/h | INTRAVENOUS | Status: DC
  Administered 2022-10-05: 1 mL/kg/h via INTRAVENOUS

## 2022-10-05 MED ORDER — HEPARIN SODIUM (PORCINE) 1000 UNIT/ML IJ SOLN
INTRAMUSCULAR | Status: DC | PRN
  Administered 2022-10-05: 3500 [IU] via INTRAVENOUS

## 2022-10-05 MED ORDER — ONDANSETRON HCL 4 MG/2ML IJ SOLN: 4.0000 mg | Freq: Four times a day (QID) | INTRAMUSCULAR | Status: DC | PRN

## 2022-10-05 MED ORDER — MIDAZOLAM HCL 2 MG/2ML IJ SOLN
INTRAMUSCULAR | Status: DC | PRN
  Administered 2022-10-05 (×2): 1 mg via INTRAVENOUS

## 2022-10-05 MED ORDER — VERAPAMIL HCL 2.5 MG/ML IV SOLN
INTRAVENOUS | Status: AC
  Filled 2022-10-05: qty 2

## 2022-10-05 MED ORDER — ACETAMINOPHEN 325 MG PO TABS: 650.0000 mg | ORAL_TABLET | ORAL | Status: DC | PRN

## 2022-10-05 MED ORDER — SODIUM CHLORIDE 0.9 % WEIGHT BASED INFUSION
3.0000 mL/kg/h | INTRAVENOUS | Status: DC
  Administered 2022-10-05: 3 mL/kg/h via INTRAVENOUS

## 2022-10-05 MED ORDER — MIDAZOLAM HCL 2 MG/2ML IJ SOLN
INTRAMUSCULAR | Status: AC
  Filled 2022-10-05: qty 2

## 2022-10-05 MED ORDER — SODIUM CHLORIDE 0.9 % IV SOLN: 250.0000 mL | INTRAVENOUS | Status: DC | PRN

## 2022-10-05 MED ORDER — HEPARIN (PORCINE) IN NACL 1000-0.9 UT/500ML-% IV SOLN
INTRAVENOUS | Status: AC
  Filled 2022-10-05: qty 1000

## 2022-10-05 MED ORDER — POTASSIUM CHLORIDE CRYS ER 10 MEQ PO TBCR
10.0000 meq | EXTENDED_RELEASE_TABLET | Freq: Once | ORAL | Status: AC
  Administered 2022-10-05: 10 meq via ORAL
  Filled 2022-10-05: qty 1

## 2022-10-05 MED ORDER — SODIUM CHLORIDE 0.9 % WEIGHT BASED INFUSION: 1.0000 mL/kg/h | INTRAVENOUS | Status: DC

## 2022-10-05 MED ORDER — HEPARIN (PORCINE) IN NACL 1000-0.9 UT/500ML-% IV SOLN
INTRAVENOUS | Status: DC | PRN
  Administered 2022-10-05 (×2): 500 mL

## 2022-10-05 MED ORDER — SODIUM CHLORIDE 0.9% FLUSH
3.0000 mL | Freq: Two times a day (BID) | INTRAVENOUS | Status: DC
  Administered 2022-10-05 – 2022-10-07 (×4): 3 mL via INTRAVENOUS

## 2022-10-05 MED ORDER — LABETALOL HCL 5 MG/ML IV SOLN: 10.0000 mg | INTRAVENOUS | Status: AC | PRN

## 2022-10-05 MED ORDER — IOHEXOL 350 MG/ML SOLN
INTRAVENOUS | Status: DC | PRN
  Administered 2022-10-05: 55 mL

## 2022-10-05 MED ORDER — INSULIN ASPART 100 UNIT/ML IJ SOLN
0.0000 [IU] | INTRAMUSCULAR | Status: DC
  Administered 2022-10-05: 1 [IU] via SUBCUTANEOUS

## 2022-10-05 MED ORDER — DEXTROSE 50 % IV SOLN
INTRAVENOUS | Status: AC
  Filled 2022-10-05: qty 50

## 2022-10-05 MED ORDER — SODIUM CHLORIDE 0.9% FLUSH: 3.0000 mL | INTRAVENOUS | Status: DC | PRN

## 2022-10-05 MED ORDER — VERAPAMIL HCL 2.5 MG/ML IV SOLN
INTRAVENOUS | Status: DC | PRN
  Administered 2022-10-05: 10 mL via INTRA_ARTERIAL

## 2022-10-05 MED ORDER — HYDRALAZINE HCL 20 MG/ML IJ SOLN: 10.0000 mg | INTRAMUSCULAR | Status: AC | PRN

## 2022-10-05 MED ORDER — SODIUM CHLORIDE 0.9 % WEIGHT BASED INFUSION: 3.0000 mL/kg/h | INTRAVENOUS | Status: DC

## 2022-10-05 MED ORDER — FENTANYL CITRATE (PF) 100 MCG/2ML IJ SOLN
INTRAMUSCULAR | Status: DC | PRN
  Administered 2022-10-05 (×2): 25 ug via INTRAVENOUS

## 2022-10-05 MED ORDER — LIDOCAINE HCL (PF) 1 % IJ SOLN
INTRAMUSCULAR | Status: AC
  Filled 2022-10-05: qty 30

## 2022-10-05 MED ORDER — ASPIRIN 81 MG PO CHEW
81.0000 mg | CHEWABLE_TABLET | ORAL | Status: AC
  Administered 2022-10-05: 81 mg via ORAL
  Filled 2022-10-05: qty 1

## 2022-10-05 MED ORDER — SODIUM CHLORIDE 0.9 % IV SOLN: INTRAVENOUS | Status: AC

## 2022-10-05 MED ORDER — HEPARIN SODIUM (PORCINE) 1000 UNIT/ML IJ SOLN
INTRAMUSCULAR | Status: AC
  Filled 2022-10-05: qty 10

## 2022-10-05 MED ORDER — FENTANYL CITRATE (PF) 100 MCG/2ML IJ SOLN
INTRAMUSCULAR | Status: AC
  Filled 2022-10-05: qty 2

## 2022-10-05 MED ORDER — LIDOCAINE HCL (PF) 1 % IJ SOLN
INTRAMUSCULAR | Status: DC | PRN
  Administered 2022-10-05: 2 mL via INTRADERMAL
  Administered 2022-10-05: 5 mL via INTRADERMAL

## 2022-10-05 MED ORDER — ASPIRIN 81 MG PO CHEW: 81.0000 mg | CHEWABLE_TABLET | ORAL | Status: DC

## 2022-10-05 SURGICAL SUPPLY — 14 items
CATH 5FR JL3.5 JR4 ANG PIG MP (CATHETERS) ×1
CATH SWAN GANZ 7F STRAIGHT (CATHETERS) ×1
DEVICE RAD COMP TR BAND LRG (VASCULAR PRODUCTS) ×1
GLIDESHEATH SLEND SS 6F .021 (SHEATH) ×1
GLIDESHEATH SLENDER 7FR .021G (SHEATH) ×1
INQWIRE 1.5J .035X260CM (WIRE) ×1
KIT HEART LEFT (KITS) ×1
PACK CARDIAC CATHETERIZATION (CUSTOM PROCEDURE TRAY) ×1
SHEATH PROBE COVER 6X72 (BAG) ×1
SYR MEDRAD MARK 7 150ML (SYRINGE)
TRANSDUCER W/STOPCOCK (MISCELLANEOUS) ×1
TUBING CIL FLEX 10 FLL-RA (TUBING) ×1
WIRE ASAHI PROWATER 180CM (WIRE) ×1
WIRE HI TORQ VERSACORE-J 145CM (WIRE) ×1

## 2022-10-05 NOTE — Progress Notes (Addendum)
Progress Note  Patient Name: Bradley [name]. Date of Encounter: 10/05/2022  Primary Cardiologist: Skeet Latch, MD  Subjective   Feeling well, no complaints except having to be NPO for cath. No CP. Dyspnea back to baseline.  Inpatient Medications    Scheduled Meds:  aspirin EC  81 mg Oral q morning   atorvastatin  20 mg Oral QPM   bisoprolol  5 mg Oral Daily   budesonide  0.5 mg Nebulization BID   calcium-vitamin D  1 tablet Oral q morning   furosemide  40 mg Oral Daily   guaiFENesin  600 mg Oral BID   ipratropium-albuterol  3 mL Nebulization BID   pantoprazole  40 mg Oral QAC breakfast   predniSONE  40 mg Oral Q breakfast   sodium chloride flush  3 mL Intravenous Q12H   tamsulosin  0.4 mg Oral QPC supper   Continuous Infusions:  sodium chloride 1 mL/kg/hr (10/05/22 0700)   cefTRIAXone (ROCEPHIN)  IV 1 g (10/04/22 0824)   heparin 1,500 Units/hr (10/04/22 1722)   PRN Meds: acetaminophen, levalbuterol, ondansetron **OR** ondansetron (ZOFRAN) IV   Vital Signs    Vitals:   10/04/22 2127 10/04/22 2300 10/05/22 0500 10/05/22 0507  BP: (!) 154/107 132/70  (!) 150/93  Pulse: 73 71  71  Resp: 20   16  Temp: (!) 97.3 F (36.3 C)   97.8 F (36.6 C)  TempSrc: Axillary   Oral  SpO2: 100%   100%  Weight:   76.4 kg   Height:        Intake/Output Summary (Last 24 hours) at 10/05/2022 1003 Last data filed at 10/05/2022 0900 Gross per 24 hour  Intake 1591.73 ml  Output 3175 ml  Net -1583.27 ml      10/05/2022    5:00 AM 10/04/2022    5:00 AM 10/03/2022    5:00 AM  Last 3 Weights  Weight (lbs) 168 lb 6.9 oz 176 lb 2.4 oz 171 lb 8.3 oz  Weight (kg) 76.4 kg 79.9 kg 77.8 kg     Telemetry    NSR, 4 beat run NSVT - Personally Reviewed  ECG    No new tracings - Personally Reviewed  Physical Exam   GEN: No acute distress.  HEENT: Normocephalic, atraumatic, sclera non-icteric. Neck: No JVD or bruits. Cardiac: RRR no murmurs, rubs, or gallops.   Respiratory: Diffusely mildly diminished and coarse bilaterally without wheezing or rales. Breathing is unlabored.  Coarse fibrotic breath sounds  GI: Soft, nontender, non-distended, BS +x 4. MS: no deformity. Extremities: No clubbing or cyanosis. No edema. Distal pedal pulses are 2+ and equal bilaterally. Neuro:  AAOx3. Follows commands. Psych:  Responds to questions appropriately with a normal affect.  Labs    High Sensitivity Troponin:   Recent Labs  Lab 10/01/22 1255 10/01/22 1428 10/02/22 0851 10/02/22 1043  TROPONINIHS 577* 685* 218* 216*      Cardiac EnzymesNo results for input(s): "TROPONINI" in the last 168 hours. No results for input(s): "TROPIPOC" in the last 168 hours.   Chemistry Recent Labs  Lab 10/01/22 1428 10/02/22 0542 10/04/22 0448 10/05/22 0420  NA 137 141 138 138  K 4.7 4.2 3.7 3.9  CL 102 103 104 104  CO2 24 27 30 28   GLUCOSE 141* 164* 128* 105*  BUN 12 19 22 16   CREATININE 0.71 0.85 0.72 0.55*  CALCIUM 8.5* 8.9 8.4* 8.7*  PROT 7.2 6.9  --   --   ALBUMIN 3.8 3.7  --   --  AST 37 24  --   --   ALT 15 17  --   --   ALKPHOS 75 64  --   --   BILITOT 1.4* 1.0  --   --   GFRNONAA >60 >60 >60 >60  ANIONGAP 11 11 4* 6     Hematology Recent Labs  Lab 10/03/22 0450 10/04/22 0448 10/05/22 0420  WBC 7.2 9.1 6.9  RBC 3.83* 3.97* 4.23  HGB 12.2* 12.5* 13.2  HCT 36.3* 38.5* 39.6  MCV 94.8 97.0 93.6  MCH 31.9 31.5 31.2  MCHC 33.6 32.5 33.3  RDW 15.1 14.8 14.3  PLT 206 210 225    BNP Recent Labs  Lab 10/01/22 1255  BNP 188.5*     DDimer  Recent Labs  Lab 10/02/22 0851  DDIMER 0.45     Radiology    ECHOCARDIOGRAM COMPLETE  Result Date: 10/03/2022    ECHOCARDIOGRAM REPORT   Patient Name:   Bradley [name]. Date of Exam: 10/03/2022 Medical Rec #:  [id number]         Height:       69.0 in Accession #:    [id number]        Weight:       171.5 lb Date of Birth:  Feb 01, 1941          BSA:          1.935 m Patient Age:    89 years           BP:           135/86 mmHg Patient Gender: M                 HR:           72 bpm. Exam Location:  Inpatient Procedure: 2D Echo Indications:    CHF  History:        Patient has prior history of Echocardiogram examinations, most                 recent 07/21/2022. Cardiomyopathy, CAD, Pacemaker, COPD,                 Arrythmias:Atrial Fibrillation; Risk Factors:Diabetes and                 Hypertension.  Sonographer:    Harvie Junior Referring Phys: [place] Comments: Technically difficult study due to poor echo windows. Image acquisition challenging due to COPD. IMPRESSIONS  1. Left ventricular ejection fraction, by estimation, is 55 to 60%. The left ventricle has normal function. Left ventricular endocardial border not optimally defined to evaluate regional wall motion. Left ventricular diastolic parameters are indeterminate.  2. Right ventricular systolic function is mildly reduced. The right ventricular size is normal. There is moderately elevated pulmonary artery systolic pressure. The estimated right ventricular systolic pressure is [id number] mmHg.  3. Left atrial size was mildly dilated.  4. Right atrial size was mildly dilated.  5. The mitral valve is degenerative. Trivial mitral valve regurgitation. No evidence of mitral stenosis.  6. The aortic valve is tricuspid. There is mild calcification of the aortic valve. There is mild thickening of the aortic valve. Aortic valve regurgitation is not visualized. No aortic stenosis is present.  7. The inferior vena cava is normal in size with <50% respiratory variability, suggesting right atrial pressure of 8 mmHg. Conclusion(s)/Recommendation(s): Consider limited echocardiogram with Definity contrast to better assess regional wall motion if clinically relevant. FINDINGS  Left Ventricle: Left  ventricular ejection fraction, by estimation, is 55 to 60%. The left ventricle has normal function. Left ventricular endocardial border not optimally defined to  evaluate regional wall motion. The left ventricular internal cavity size was normal in size. There is no left ventricular hypertrophy. Left ventricular diastolic parameters are indeterminate. Right Ventricle: The right ventricular size is normal. No increase in right ventricular wall thickness. Right ventricular systolic function is mildly reduced. There is moderately elevated pulmonary artery systolic pressure. The tricuspid regurgitant velocity is 3.27 m/s, and with an assumed right atrial pressure of 8 mmHg, the estimated right ventricular systolic pressure is [id number] mmHg. Left Atrium: Left atrial size was mildly dilated. Right Atrium: Right atrial size was mildly dilated. Pericardium: There is no evidence of pericardial effusion. Mitral Valve: The mitral valve is degenerative in appearance. Mild mitral annular calcification. Trivial mitral valve regurgitation. No evidence of mitral valve stenosis. Tricuspid Valve: The tricuspid valve is normal in structure. Tricuspid valve regurgitation is mild . No evidence of tricuspid stenosis. Aortic Valve: The aortic valve is tricuspid. There is mild calcification of the aortic valve. There is mild thickening of the aortic valve. Aortic valve regurgitation is not visualized. No aortic stenosis is present. Aortic valve mean gradient measures 4.0 mmHg. Aortic valve peak gradient measures 7.7 mmHg. Aortic valve area, by VTI measures 1.91 cm. Pulmonic Valve: The pulmonic valve was normal in structure. Pulmonic valve regurgitation is trivial. No evidence of pulmonic stenosis. Aorta: The aortic root is normal in size and structure. Venous: The inferior vena cava is normal in size with less than 50% respiratory variability, suggesting right atrial pressure of 8 mmHg. IAS/Shunts: The atrial septum is grossly normal.  LEFT VENTRICLE PLAX 2D LVIDd:         4.70 cm     Diastology LVIDs:         3.20 cm     LV e' medial:    11.10 cm/s LV PW:         0.80 cm     LV E/e' medial:  8.6 LV  IVS:        0.80 cm     LV e' lateral:   10.70 cm/s LVOT diam:     2.20 cm     LV E/e' lateral: 8.9 LV SV:         45 LV SV Index:   23 LVOT Area:     3.80 cm  LV Volumes (MOD) LV vol d, MOD A2C: 74.9 ml LV vol d, MOD A4C: 63.6 ml LV vol s, MOD A2C: 25.6 ml LV vol s, MOD A4C: 24.2 ml LV SV MOD A2C:     49.3 ml LV SV MOD A4C:     63.6 ml LV SV MOD BP:      42.9 ml RIGHT VENTRICLE RV Basal diam:  4.00 cm RV Mid diam:    3.40 cm RV S prime:     12.60 cm/s TAPSE (M-mode): 2.4 cm LEFT ATRIUM             Index        RIGHT ATRIUM           Index LA diam:        4.20 cm 2.17 cm/m   RA Area:     22.10 cm LA Vol (A2C):   81.1 ml 41.90 ml/m  RA Volume:   56.50 ml  29.19 ml/m LA Vol (A4C):   75.4 ml 38.96 ml/m LA Biplane Vol: 82.1 ml 42.[place]  ml/m  AORTIC VALVE                    PULMONIC VALVE AV Area (Vmax):    1.80 cm     PV Vmax:          0.87 m/s AV Area (Vmean):   1.83 cm     PV Peak grad:     3.0 mmHg AV Area (VTI):     1.91 cm     PR End Diast Vel: 7.62 msec AV Vmax:           139.00 cm/s AV Vmean:          98.300 cm/s AV VTI:            0.237 m AV Peak Grad:      7.7 mmHg AV Mean Grad:      4.0 mmHg LVOT Vmax:         65.90 cm/s LVOT Vmean:        47.300 cm/s LVOT VTI:          0.119 m LVOT/AV VTI ratio: 0.50  AORTA Ao Root diam: 3.30 cm Ao Asc diam:  3.50 cm MITRAL VALVE               TRICUSPID VALVE MV Area (PHT): 4.39 cm    TR Peak grad:   42.8 mmHg MV Decel Time: 173 msec    TR Vmax:        327.00 cm/s MR Peak grad: 72.1 mmHg MR Vmax:      424.50 cm/s  SHUNTS MV E velocity: 95.10 cm/s  Systemic VTI:  0.12 m MV A velocity: 26.60 cm/s  Systemic Diam: 2.20 cm MV E/A ratio:  3.58 Cherlynn Kaiser MD Electronically signed by Cherlynn Kaiser MD Signature Date/Time: 10/03/2022/11:55:23 AM    Final     Cardiac Studies   Echo 10/03/22    1. Left ventricular ejection fraction, by estimation, is 55 to 60%. The  left ventricle has normal function. Left ventricular endocardial border  not optimally defined to  evaluate regional wall motion. Left ventricular  diastolic parameters are  indeterminate.   2. Right ventricular systolic function is mildly reduced. The right  ventricular size is normal. There is moderately elevated pulmonary artery  systolic pressure. The estimated right ventricular systolic pressure is  [id number] mmHg.   3. Left atrial size was mildly dilated.   4. Right atrial size was mildly dilated.   5. The mitral valve is degenerative. Trivial mitral valve regurgitation.  No evidence of mitral stenosis.   6. The aortic valve is tricuspid. There is mild calcification of the  aortic valve. There is mild thickening of the aortic valve. Aortic valve  regurgitation is not visualized. No aortic stenosis is present.   7. The inferior vena cava is normal in size with <50% respiratory  variability, suggesting right atrial pressure of 8 mmHg.   Conclusion(s)/Recommendation(s): Consider limited echocardiogram with  Definity contrast to better assess regional wall motion if clinically  relevant.   Patient Profile     81 y.o. male with chronic atrial fibrillation s/p AVN ablation and CRT-P, chronic diastolic CHF (with prior reduction of EF in setting of AF RVR, improved), HTN, HLD, prior ICH (no anticoagulation), COPD, chronic hypoxic respiratory failure, prior admissions for Covid/URIs, question of prior ILD, PAD (details unclear), NSVT by recent device interrogation. Has CAD listed in [place] with cath 2005 with minimal disease but repeat cath 2008 free of significant CAD.  Admitted 07/2022 with multifactorial resp failure, + rhinovirus, COPD/CHF. Admitted twice in 08/2022 with Covid then PNA. Readmitted 12/22 with worsening chest discomfort and progressive DOE.   Assessment & Plan    1. Chest discomfort/DOE with elevated troponin - hsTroponin [phone] - Dr. Eden Emms recommended [facility] today, patient on board - per cath team will be later this afternoon since he is Covid +, they did not advise  clear liquid breakfast - on heparin, ASA, statin, BB - check lipids in AM (coronary, aortic atherosclerosis on CT)  2. Chronic diastolic CHF, HTN - BNP minimally elevated seemingly out of proportion to symptoms though has required diuresis in the past -  for [facility] today to help guide volume - will hold Lasix today since going for cath (Dr. Eden Emms had also ordered IVF pre-cath) - net -4L, weights quite variable - BP creeping up - consider restarting amlodipine soon, also on Farxiga PTA not restarted here  3. Chronic AF, AVN ablation, CRT-P - will need OP EP f/u for gen change per notes - not on OAC due to h/o ICH  4. 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 Abnormal TSH, suppressed - per medicine team  6. NSVT - per 12/21 ph note, 3 episodes of short NSVT and one episode >20 beats in monitor only zone - K 3.9, Mg 2.5 - give Kcl today and follow on BB  For questions or updates, please contact [facility] Please consult [email or web address] for contact info under Cardiology/STEMI.  Signed, Laurann Montana, PA-C 10/05/2022, 10:03 AM     Attending Note:   The patient was seen and examined.  Agree with assessment and plan as noted above.  Changes made to the above note as needed.  Patient seen and independently examined with Ronie Spies, PA .   We discussed all aspects of the encounter. I agree with the assessment and plan as stated above.      Chest pain with + troponin:  Dr Eden Emms has set up cath . Will be later this afternoon.  We have discussed risks, benefits, options.  He understands and agrees to proceed.    2..   Interstitial lung disease / ? COPD :   will need pulmonary evaluaton    3.   Acute on chronic diastolic CHF ; cont diuresis      I have spent a total of 40 minutes with patient reviewing hospital  notes , telemetry, EKGs, labs and examining patient as well as establishing an assessment and plan that was discussed with the patient.  > 50% of time was spent in direct patient care.    Vesta Mixer, Montez Hageman., MD, [facility] 10/05/2022, 11:57 AM [address],  [address] Office [phone] Pager [phone]

## 2022-10-05 NOTE — H&P (View-Only) (Signed)
Progress Note  Patient Name: Bradley J Lincoln Sr. Date of Encounter: 10/05/2022  Primary Cardiologist: Tiffany Smallwood, MD  Subjective   Feeling well, no complaints except having to be NPO for cath. No CP. Dyspnea back to baseline.  Inpatient Medications    Scheduled Meds:  aspirin EC  81 mg Oral q morning   atorvastatin  20 mg Oral QPM   bisoprolol  5 mg Oral Daily   budesonide  0.5 mg Nebulization BID   calcium-vitamin D  1 tablet Oral q morning   furosemide  40 mg Oral Daily   guaiFENesin  600 mg Oral BID   ipratropium-albuterol  3 mL Nebulization BID   pantoprazole  40 mg Oral QAC breakfast   predniSONE  40 mg Oral Q breakfast   sodium chloride flush  3 mL Intravenous Q12H   tamsulosin  0.4 mg Oral QPC supper   Continuous Infusions:  sodium chloride 1 mL/kg/hr (10/05/22 0700)   cefTRIAXone (ROCEPHIN)  IV 1 g (10/04/22 0824)   heparin 1,500 Units/hr (10/04/22 1722)   PRN Meds: acetaminophen, levalbuterol, ondansetron **OR** ondansetron (ZOFRAN) IV   Vital Signs    Vitals:   10/04/22 2127 10/04/22 2300 10/05/22 0500 10/05/22 0507  BP: (!) 154/107 132/70  (!) 150/93  Pulse: 73 71  71  Resp: 20   16  Temp: (!) 97.3 F (36.3 C)   97.8 F (36.6 C)  TempSrc: Axillary   Oral  SpO2: 100%   100%  Weight:   76.4 kg   Height:        Intake/Output Summary (Last 24 hours) at 10/05/2022 1003 Last data filed at 10/05/2022 0900 Gross per 24 hour  Intake 1591.73 ml  Output 3175 ml  Net -1583.27 ml      10/05/2022    5:00 AM 10/04/2022    5:00 AM 10/03/2022    5:00 AM  Last 3 Weights  Weight (lbs) 168 lb 6.9 oz 176 lb 2.4 oz 171 lb 8.3 oz  Weight (kg) 76.4 kg 79.9 kg 77.8 kg     Telemetry    NSR, 4 beat run NSVT - Personally Reviewed  ECG    No new tracings - Personally Reviewed  Physical Exam   GEN: No acute distress.  HEENT: Normocephalic, atraumatic, sclera non-icteric. Neck: No JVD or bruits. Cardiac: RRR no murmurs, rubs, or gallops.   Respiratory: Diffusely mildly diminished and coarse bilaterally without wheezing or rales. Breathing is unlabored.  Coarse fibrotic breath sounds  GI: Soft, nontender, non-distended, BS +x 4. MS: no deformity. Extremities: No clubbing or cyanosis. No edema. Distal pedal pulses are 2+ and equal bilaterally. Neuro:  AAOx3. Follows commands. Psych:  Responds to questions appropriately with a normal affect.  Labs    High Sensitivity Troponin:   Recent Labs  Lab 10/01/22 1255 10/01/22 1428 10/02/22 0851 10/02/22 1043  TROPONINIHS 577* 685* 218* 216*      Cardiac EnzymesNo results for input(s): "TROPONINI" in the last 168 hours. No results for input(s): "TROPIPOC" in the last 168 hours.   Chemistry Recent Labs  Lab 10/01/22 1428 10/02/22 0542 10/04/22 0448 10/05/22 0420  NA 137 141 138 138  K 4.7 4.2 3.7 3.9  CL 102 103 104 104  CO2 24 27 30 28  GLUCOSE 141* 164* 128* 105*  BUN 12 19 22 16  CREATININE 0.71 0.85 0.72 0.55*  CALCIUM 8.5* 8.9 8.4* 8.7*  PROT 7.2 6.9  --   --   ALBUMIN 3.8 3.7  --   --     AST 37 24  --   --   ALT 15 17  --   --   ALKPHOS 75 64  --   --   BILITOT 1.4* 1.0  --   --   GFRNONAA >60 >60 >60 >60  ANIONGAP 11 11 4* 6     Hematology Recent Labs  Lab 10/03/22 0450 10/04/22 0448 10/05/22 0420  WBC 7.2 9.1 6.9  RBC 3.83* 3.97* 4.23  HGB 12.2* 12.5* 13.2  HCT 36.3* 38.5* 39.6  MCV 94.8 97.0 93.6  MCH 31.9 31.5 31.2  MCHC 33.6 32.5 33.3  RDW 15.1 14.8 14.3  PLT 206 210 225    BNP Recent Labs  Lab 10/01/22 1255  BNP 188.5*     DDimer  Recent Labs  Lab 10/02/22 0851  DDIMER 0.45     Radiology    ECHOCARDIOGRAM COMPLETE  Result Date: 10/03/2022    ECHOCARDIOGRAM REPORT   Patient Name:   Bradley J Jerome Sr. Date of Exam: 10/03/2022 Medical Rec #:  [id number]         Height:       69.0 in Accession #:    [id number]        Weight:       171.5 lb Date of Birth:  04/21/1941          BSA:          1.935 m Patient Age:    81 years           BP:           135/86 mmHg Patient Gender: M                 HR:           72 bpm. Exam Location:  Inpatient Procedure: 2D Echo Indications:    CHF  History:        Patient has prior history of Echocardiogram examinations, most                 recent 07/21/2022. Cardiomyopathy, CAD, Pacemaker, COPD,                 Arrythmias:Atrial Fibrillation; Risk Factors:Diabetes and                 Hypertension.  Sonographer:    Wendy Porter Referring Phys: [id number] PETER C NISHAN  Sonographer Comments: Technically difficult study due to poor echo windows. Image acquisition challenging due to COPD. IMPRESSIONS  1. Left ventricular ejection fraction, by estimation, is 55 to 60%. The left ventricle has normal function. Left ventricular endocardial border not optimally defined to evaluate regional wall motion. Left ventricular diastolic parameters are indeterminate.  2. Right ventricular systolic function is mildly reduced. The right ventricular size is normal. There is moderately elevated pulmonary artery systolic pressure. The estimated right ventricular systolic pressure is 50.8 mmHg.  3. Left atrial size was mildly dilated.  4. Right atrial size was mildly dilated.  5. The mitral valve is degenerative. Trivial mitral valve regurgitation. No evidence of mitral stenosis.  6. The aortic valve is tricuspid. There is mild calcification of the aortic valve. There is mild thickening of the aortic valve. Aortic valve regurgitation is not visualized. No aortic stenosis is present.  7. The inferior vena cava is normal in size with <50% respiratory variability, suggesting right atrial pressure of 8 mmHg. Conclusion(s)/Recommendation(s): Consider limited echocardiogram with Definity contrast to better assess regional wall motion if clinically relevant. FINDINGS  Left Ventricle: Left   ventricular ejection fraction, by estimation, is 55 to 60%. The left ventricle has normal function. Left ventricular endocardial border not optimally defined to  evaluate regional wall motion. The left ventricular internal cavity size was normal in size. There is no left ventricular hypertrophy. Left ventricular diastolic parameters are indeterminate. Right Ventricle: The right ventricular size is normal. No increase in right ventricular wall thickness. Right ventricular systolic function is mildly reduced. There is moderately elevated pulmonary artery systolic pressure. The tricuspid regurgitant velocity is 3.27 m/s, and with an assumed right atrial pressure of 8 mmHg, the estimated right ventricular systolic pressure is 50.8 mmHg. Left Atrium: Left atrial size was mildly dilated. Right Atrium: Right atrial size was mildly dilated. Pericardium: There is no evidence of pericardial effusion. Mitral Valve: The mitral valve is degenerative in appearance. Mild mitral annular calcification. Trivial mitral valve regurgitation. No evidence of mitral valve stenosis. Tricuspid Valve: The tricuspid valve is normal in structure. Tricuspid valve regurgitation is mild . No evidence of tricuspid stenosis. Aortic Valve: The aortic valve is tricuspid. There is mild calcification of the aortic valve. There is mild thickening of the aortic valve. Aortic valve regurgitation is not visualized. No aortic stenosis is present. Aortic valve mean gradient measures 4.0 mmHg. Aortic valve peak gradient measures 7.7 mmHg. Aortic valve area, by VTI measures 1.91 cm. Pulmonic Valve: The pulmonic valve was normal in structure. Pulmonic valve regurgitation is trivial. No evidence of pulmonic stenosis. Aorta: The aortic root is normal in size and structure. Venous: The inferior vena cava is normal in size with less than 50% respiratory variability, suggesting right atrial pressure of 8 mmHg. IAS/Shunts: The atrial septum is grossly normal.  LEFT VENTRICLE PLAX 2D LVIDd:         4.70 cm     Diastology LVIDs:         3.20 cm     LV e' medial:    11.10 cm/s LV PW:         0.80 cm     LV E/e' medial:  8.6 LV  IVS:        0.80 cm     LV e' lateral:   10.70 cm/s LVOT diam:     2.20 cm     LV E/e' lateral: 8.9 LV SV:         45 LV SV Index:   23 LVOT Area:     3.80 cm  LV Volumes (MOD) LV vol d, MOD A2C: 74.9 ml LV vol d, MOD A4C: 63.6 ml LV vol s, MOD A2C: 25.6 ml LV vol s, MOD A4C: 24.2 ml LV SV MOD A2C:     49.3 ml LV SV MOD A4C:     63.6 ml LV SV MOD BP:      42.9 ml RIGHT VENTRICLE RV Basal diam:  4.00 cm RV Mid diam:    3.40 cm RV S prime:     12.60 cm/s TAPSE (M-mode): 2.4 cm LEFT ATRIUM             Index        RIGHT ATRIUM           Index LA diam:        4.20 cm 2.17 cm/m   RA Area:     22.10 cm LA Vol (A2C):   81.1 ml 41.90 ml/m  RA Volume:   56.50 ml  29.19 ml/m LA Vol (A4C):   75.4 ml 38.96 ml/m LA Biplane Vol: 82.1 ml 42.42   ml/m  AORTIC VALVE                    PULMONIC VALVE AV Area (Vmax):    1.80 cm     PV Vmax:          0.87 m/s AV Area (Vmean):   1.83 cm     PV Peak grad:     3.0 mmHg AV Area (VTI):     1.91 cm     PR End Diast Vel: 7.62 msec AV Vmax:           139.00 cm/s AV Vmean:          98.300 cm/s AV VTI:            0.237 m AV Peak Grad:      7.7 mmHg AV Mean Grad:      4.0 mmHg LVOT Vmax:         65.90 cm/s LVOT Vmean:        47.300 cm/s LVOT VTI:          0.119 m LVOT/AV VTI ratio: 0.50  AORTA Ao Root diam: 3.30 cm Ao Asc diam:  3.50 cm MITRAL VALVE               TRICUSPID VALVE MV Area (PHT): 4.39 cm    TR Peak grad:   42.8 mmHg MV Decel Time: 173 msec    TR Vmax:        327.00 cm/s MR Peak grad: 72.1 mmHg MR Vmax:      424.50 cm/s  SHUNTS MV E velocity: 95.10 cm/s  Systemic VTI:  0.12 m MV A velocity: 26.60 cm/s  Systemic Diam: 2.20 cm MV E/A ratio:  3.58 Gayatri Acharya MD Electronically signed by Gayatri Acharya MD Signature Date/Time: 10/03/2022/11:55:23 AM    Final     Cardiac Studies   Echo 10/03/22    1. Left ventricular ejection fraction, by estimation, is 55 to 60%. The  left ventricle has normal function. Left ventricular endocardial border  not optimally defined to  evaluate regional wall motion. Left ventricular  diastolic parameters are  indeterminate.   2. Right ventricular systolic function is mildly reduced. The right  ventricular size is normal. There is moderately elevated pulmonary artery  systolic pressure. The estimated right ventricular systolic pressure is  50.8 mmHg.   3. Left atrial size was mildly dilated.   4. Right atrial size was mildly dilated.   5. The mitral valve is degenerative. Trivial mitral valve regurgitation.  No evidence of mitral stenosis.   6. The aortic valve is tricuspid. There is mild calcification of the  aortic valve. There is mild thickening of the aortic valve. Aortic valve  regurgitation is not visualized. No aortic stenosis is present.   7. The inferior vena cava is normal in size with <50% respiratory  variability, suggesting right atrial pressure of 8 mmHg.   Conclusion(s)/Recommendation(s): Consider limited echocardiogram with  Definity contrast to better assess regional wall motion if clinically  relevant.   Patient Profile     81 y.o. male with chronic atrial fibrillation s/p AVN ablation and CRT-P, chronic diastolic CHF (with prior reduction of EF in setting of AF RVR, improved), HTN, HLD, prior ICH (no anticoagulation), COPD, chronic hypoxic respiratory failure, prior admissions for Covid/URIs, question of prior ILD, PAD (details unclear), NSVT by recent device interrogation. Has CAD listed in PMH with cath 2005 with minimal disease but repeat cath 2008 free of significant CAD.   Admitted 07/2022 with multifactorial resp failure, + rhinovirus, COPD/CHF. Admitted twice in 08/2022 with Covid then PNA. Readmitted 12/22 with worsening chest discomfort and progressive DOE.   Assessment & Plan    1. Chest discomfort/DOE with elevated troponin - hsTroponin [phone] - Dr. Eden Emms recommended [facility] today, patient on board - per cath team will be later this afternoon since he is Covid +, they did not advise  clear liquid breakfast - on heparin, ASA, statin, BB - check lipids in AM (coronary, aortic atherosclerosis on CT)  2. Chronic diastolic CHF, HTN - BNP minimally elevated seemingly out of proportion to symptoms though has required diuresis in the past -  for [facility] today to help guide volume - will hold Lasix today since going for cath (Dr. Eden Emms had also ordered IVF pre-cath) - net -4L, weights quite variable - BP creeping up - consider restarting amlodipine soon, also on Farxiga PTA not restarted here  3. Chronic AF, AVN ablation, CRT-P - will need OP EP f/u for gen change per notes - not on OAC due to h/o ICH  4. 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 Abnormal TSH, suppressed - per medicine team  6. NSVT - per 12/21 ph note, 3 episodes of short NSVT and one episode >20 beats in monitor only zone - K 3.9, Mg 2.5 - give Kcl today and follow on BB  For questions or updates, please contact [facility] Please consult [email or web address] for contact info under Cardiology/STEMI.  Signed, Laurann Montana, PA-C 10/05/2022, 10:03 AM     Attending Note:   The patient was seen and examined.  Agree with assessment and plan as noted above.  Changes made to the above note as needed.  Patient seen and independently examined with Ronie Spies, PA .   We discussed all aspects of the encounter. I agree with the assessment and plan as stated above.      Chest pain with + troponin:  Dr Eden Emms has set up cath . Will be later this afternoon.  We have discussed risks, benefits, options.  He understands and agrees to proceed.    2..   Interstitial lung disease / ? COPD :   will need pulmonary evaluaton    3.   Acute on chronic diastolic CHF ; cont diuresis      I have spent a total of 40 minutes with patient reviewing hospital  notes , telemetry, EKGs, labs and examining patient as well as establishing an assessment and plan that was discussed with the patient.  > 50% of time was spent in direct patient care.    Vesta Mixer, Montez Hageman., MD, [facility] 10/05/2022, 11:57 AM [address],  [address] Office [phone] Pager [phone]

## 2022-10-05 NOTE — Progress Notes (Signed)
ANTICOAGULATION CONSULT NOTE - Follow up Consult  Pharmacy Consult for IV heparin Indication: chest pain/ACS  Allergies  Allergen Reactions   Hydrocodone Other (See Comments)    "given to him in the hospital; went thru withdrawals once home; dr said not to take it again" (09/27/2012) dizziness   Ultram [Tramadol] Other (See Comments)    "given to him in the hospital; went thru withdrawals once home; dr said not to take it again" (09/27/2012) dizziness   Cialis [Tadalafil] Other (See Comments)    Headache Backache  Dizziness    Avelox [Moxifloxacin Hydrochloride] Itching and Other (See Comments)    Headache Dizziness    Patient Measurements: Height: 5\' 9"  (175.3 cm) Weight: 76.4 kg (168 lb 6.9 oz) IBW/kg (Calculated) : 70.7 Heparin Dosing Weight: TBW  Vital Signs: Temp: 97.8 F (36.6 C) (12/26 0507) Temp Source: Oral (12/26 0507) BP: 150/93 (12/26 0507) Pulse Rate: 71 (12/26 0507)  Labs: Recent Labs    10/02/22 0851 10/02/22 1043 10/02/22 1442 10/03/22 0450 10/03/22 1113 10/03/22 1856 10/04/22 0448 10/05/22 0420  HGB  --   --    < > 12.2*  --   --  12.5* 13.2  HCT  --   --   --  36.3*  --   --  38.5* 39.6  PLT  --   --   --  206  --   --  210 225  HEPARINUNFRC  --   --    < > 0.23*   < > 0.47 0.50 0.64  CREATININE  --   --   --   --   --   --  0.72 0.55*  TROPONINIHS 218* 216*  --   --   --   --   --   --    < > = values in this interval not displayed.     Estimated Creatinine Clearance: 72.4 mL/min (A) (by C-G formula based on SCr of 0.55 mg/dL (L)).   Medical History: Past Medical History:  Diagnosis Date   Abnormal CT scan, chest 2012   CT chest, several lymphadenopathies. Sees pulmonary   Anemia    intermittent   Arthritis    "?back" (09/19/2018)   Atrial fibrillation (HCC)    s/p AV node ablation & BiV PPM implantation 09/08/11 (op dictation pending)   BPH (benign prostatic hyperplasia)    Saw Dr 09/10/11 2004, normal renal u/s   Bronchitis  08/26/2017   CAD (coronary artery disease)    CHF (congestive heart failure) (HCC)    Thought primarily to be non-systolic although EF down (EF 08/28/2017 12/2010, down to 35-40% 09/05/11), cath 2008 with no CAD, nuclear study 07/2011 showing Small area of reversibility in the distal ant/lat wall the left ventricle suspicious for ischemia/septal wall HK but felt to be low risk  (per D/C Summary 07/2011)   Chronic bronchitis (HCC)    "get it ~ q yr"   Chronic diastolic heart failure (HCC)         Chronic respiratory failure with hypoxia and hypercapnia (HCC) 09/14/2010   Followed in Pulmonary clinic/ [facility]/ [facility]  Started on 02 2lpm at discharge 09/23/11   - PFT's 10/28/2011  FEV1  1.40 (51%)  with ratio 70 and no better p B2 and DLCO 53 corrects to 101    - PFT's 10/18/2014    FEV1 1.71 (59%) with ratio 68 and no sign change p B2 and DLCO 53 corrects to 85  -  HC03  07/28/20  = 33  -  HCO3   03/17/21     = 31     Heart murmur    HLD (hyperlipidemia)    HTN (hypertension)    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   Pleural effusion 2008   S/p decortication   Pneumonia 08/02/2011   Pulmonary HTN (HCC)    per cath 2008   Type II diabetes mellitus (HCC) 1999    Medications: Infusions:  sodium chloride     cefTRIAXone (ROCEPHIN)  IV 1 g (10/04/22 0824)   heparin 1,500 Units/hr (10/04/22 1722)     Assessment: 60 yoM with PMH CAD, Afib with pacemaker, not on anticoagulation, PVD, Pulm HTN, CHF, COPD on 4L O2 at baseline, presenting 12/22 with SOB, CP, cough. Found to have elevated troponins; Cardiology recommending short term heparin infusion per Pharmacy.  Significant events: -IV access lost 12/23 pm - heparin resumed ~ 0030 on 12/24  Today, 10/05/2022: Heparin level 0.64 - remains therapeutic with  heparin infusion at 1500 units/hr Hgb/Pltc: WNL No bleeding or infusion issues noted  Goal of Therapy: Heparin level 0.3-0.7 units/ml Monitor platelets by anticoagulation protocol: Yes  Plan: Continue heparin infusion at 1500 units/hr  Daily CBC, heparin level Monitor for signs of bleeding Follow up timing for cardiac cath   Lynann Beaver PharmD, BCPS [facility] main pharmacy [phone] 10/05/2022 7:17 AM

## 2022-10-05 NOTE — Progress Notes (Signed)
[facility]                                                                               Bradley [name], is a 81 y.o. male, DOB - 08-Nov-1940, HUT:[id number] Admit date - 10/01/2022    Outpatient Primary MD for the patient is [name], MD  LOS - 4  days    Brief summary   Bradley HIDALGO Sr. is a 81 y.o. male with medical history significant of  history of anemia, osteoarthritis, chronic atrial fibrillation status post AV nodal ablation and biventricular pacemaker, BPH, bronchitis, CAD, chronic diastolic heart failure on 5L O2at home,pulmonary hypertension, COPD, chronic respiratory failure, hyperlipidemia, hypertension, diabetes mellitus type 2, intracranial bleed, migraine headache, insomnia, pleural effusions.  Patient also has interim history of admission  08/27/22-08/30/22 with discharge diagnosis of CAP/COPD exacerbation  in setting of COVID infection diagnosed 08/23/22. Patient was treated  antibiotics  as well as steroids for COPD exacerbation with good response and was discharged home.  Patient now return to ED close to one month later with complaints of progressive DOE over the last 1 week associated with central chest discomfort improved with rest.    Assessment & Plan    Assessment and Plan:  Acute COPD exacerbation with acute on chronic hypoxic respiratory failure  Wheezing has improved. He remains on 4 Lit of [place] oxygen.  Improving,  no wheezing on exam. He continues to report PND intermittently , waking up in the middle of the night with some pressures mid sternal and radiating to his shoulders.  Continue with prednisone taper.  CT chest shows  Patchy opacity at the base of the right lower lobe may be due to atelectasis or pneumonia. Mosaic perfusion in the lungs is consistent small airways disease and air trapping, similar to the appearance on the prior chest CT. Completed the course of antibiotics.  Continue with duonebs.     Persistent +COVID-19  -1st  + 08/23/22 - complete the course of steroids.     CHF/ elevated troponins.  Demand ischemia from CHF. He continues to report PND, waking up in the middle of the night with some pressures mid sternal and radiating to his shoulders. No sob or chest pain over night an dthis morning.  Cardiology on board.  Echocardiogram done and reviewed. He is scheduled for cardiac cath  today.    Anemia of chronic disease:     Chronic atrial fibrillation with AV node ablation   S/p PPM  - plan to change the battery next month.      Pulmonary Hypertension Echocardiogram reviewed.     Hypertension;  Bp parameters are optimal.       Hx of ICH -no active issues    Type 2 Diabetes Mellitus: well controlled cbg's.  Resume SSI.  CBG (last 3)  Recent Labs    10/05/22 1040 10/05/22 1238  GLUCAP 119* 140*       BPH -continue flomax    Hyperlipidemia:  Resume statin.    Estimated body mass index is 24.87 kg/m as calculated from the following:   Height as of this encounter: 5\' 9"  (1.753 m).   Weight as  of this encounter: 76.4 kg.  Code Status: full code.  DVT Prophylaxis:  Heparin.    Level of Care: Level of care: Progressive Family Communication: none at bedside.   Disposition Plan:    cath today.   Procedures:  None.   Consultants:   Cardiology.   Antimicrobials:   Anti-infectives (From admission, onward)    Start     Dose/Rate Route Frequency Ordered Stop   10/01/22 2045  cefTRIAXone (ROCEPHIN) 1 g in sodium chloride 0.9 % 100 mL IVPB        1 g 200 mL/hr over 30 Minutes Intravenous Daily 10/01/22 2022 10/05/22 1121        Medications  Scheduled Meds:  [MAR Hold] aspirin EC  81 mg Oral q morning   [MAR Hold] atorvastatin  20 mg Oral QPM   [MAR Hold] bisoprolol  5 mg Oral Daily   [MAR Hold] budesonide  0.5 mg Nebulization BID   [MAR Hold] calcium-vitamin D  1 tablet Oral q morning   dextrose       [MAR Hold] guaiFENesin  600 mg Oral BID   [MAR Hold]  insulin aspart  0-6 Units Subcutaneous Q4H   [MAR Hold] ipratropium-albuterol  3 mL Nebulization BID   [MAR Hold] pantoprazole  40 mg Oral QAC breakfast   [MAR Hold] predniSONE  40 mg Oral Q breakfast   [MAR Hold] sodium chloride flush  3 mL Intravenous Q12H   [MAR Hold] tamsulosin  0.4 mg Oral QPC supper   Continuous Infusions:  sodium chloride 1 mL/kg/hr (10/05/22 0700)   heparin Stopped (10/05/22 1442)   PRN Meds:.[MAR Hold] acetaminophen, dextrose, fentaNYL, Heparin (Porcine) in NaCl, heparin sodium (porcine), iohexol, [MAR Hold] levalbuterol, lidocaine (PF), midazolam, [MAR Hold] ondansetron **OR** [MAR Hold] ondansetron (ZOFRAN) IV, Radial Cocktail/Verapamil only, Radial Cocktail/Verapamil only    Subjective:   Bradley [name] was seen and examined today. No new complaints.   Objective:   Vitals:   10/04/22 2300 10/05/22 0500 10/05/22 0507 10/05/22 1438  BP: 132/70  (!) 150/93   Pulse: 71  71   Resp:   16   Temp:   97.8 F (36.6 C)   TempSrc:   Oral   SpO2:   100% 98%  Weight:  76.4 kg    Height:        Intake/Output Summary (Last 24 hours) at 10/05/2022 1553 Last data filed at 10/05/2022 1411 Gross per 24 hour  Intake 1992.73 ml  Output 2625 ml  Net -632.27 ml    Filed Weights   10/03/22 0500 10/04/22 0500 10/05/22 0500  Weight: 77.8 kg 79.9 kg 76.4 kg     Exam General exam: Appears calm and comfortable  Respiratory system: diminished air entry at bases. On 4 lit of [place] oxygen.  Cardiovascular system: S1 & S2 heard, RRR. No JVD,  Gastrointestinal system: Abdomen is nondistended, soft and nontender.  Central nervous system: Alert and oriented. No focal neurological deficits. Extremities: Symmetric 5 x 5 power. Skin: No rashes, lesions or ulcers Psychiatry: Mood & affect appropriate.       Data Reviewed:  I have personally reviewed following labs and imaging studies   CBC Lab Results  Component Value Date   WBC 6.9 10/05/2022   RBC 4.23 10/05/2022    HGB 13.2 10/05/2022   HCT 39.6 10/05/2022   MCV 93.6 10/05/2022   MCH 31.2 10/05/2022   PLT 225 10/05/2022   MCHC 33.3 10/05/2022   RDW 14.3 10/05/2022   LYMPHSABS 1.7 10/01/2022  MONOABS 0.6 10/01/2022   EOSABS 0.0 10/01/2022   BASOSABS 0.0 10/01/2022     Last metabolic panel Lab Results  Component Value Date   NA 138 10/05/2022   K 3.9 10/05/2022   CL 104 10/05/2022   CO2 28 10/05/2022   BUN 16 10/05/2022   CREATININE 0.55 (L) 10/05/2022   GLUCOSE 105 (H) 10/05/2022   GFRNONAA >60 10/05/2022   GFRAA >60 09/18/2019   CALCIUM 8.7 (L) 10/05/2022   PHOS 2.6 10/04/2022   PROT 6.9 10/02/2022   ALBUMIN 3.7 10/02/2022   BILITOT 1.0 10/02/2022   ALKPHOS 64 10/02/2022   AST 24 10/02/2022   ALT 17 10/02/2022   ANIONGAP 6 10/05/2022    CBG (last 3)  Recent Labs    10/05/22 1040 10/05/22 1238  GLUCAP 119* 140*       Coagulation Profile: No results for input(s): "INR", "PROTIME" in the last 168 hours.   Radiology Studies: CARDIAC CATHETERIZATION  Addendum Date: 10/05/2022     The left ventricular systolic function is normal.   LV end diastolic pressure is normal.   The left ventricular ejection fraction is 50-55% by visual estimate.   Hemodynamic findings consistent with mild pulmonary hypertension.   There is no aortic valve stenosis.   Minimal, nonobstructive coronary artery disease.   Ao sat 99%, PA sat 68%, mean RA pressure 8 mmHg, PA pressure 43/17, mean PA pressure 29 mmHg, pulmonary capillary wedge pressure 13/15, mean pulmonary capillary wedge pressure 12 mmHg, cardiac output 4.60 L/min, cardiac index 2.39.   Tortuous right radial artery with radial loop.  This was straightened with an interventional wire and the patient tolerated this well.  We were able to complete the procedure from the right radial approach.  If cath was needed in the future, would consider alternative access. Continue preventive therapy from cardiac standpoint.  Plan is for him to go back  to [facility] for further pulmonary management.  Result Date: 10/05/2022   The left ventricular systolic function is normal.   LV end diastolic pressure is normal.   The left ventricular ejection fraction is 50-55% by visual estimate.   Hemodynamic findings consistent with mild pulmonary hypertension.   There is no aortic valve stenosis.   Minimal, nonobstructive coronary artery disease.   Ao sat 99%, PA sat 68%, mean RA pressure 8 mmHg, PA pressure 43/17, mean PA pressure 29 mmHg, pulmonary capillary wedge pressure 13/15, mean pulmonary capillary wedge pressure 12 mmHg, cardiac output 4.60 L/min, cardiac index 2.39. Continue preventive therapy from cardiac standpoint.  Plan is for him to go back to [facility] for further pulmonary management.       Kathlen Mody M.D. [facility] 10/05/2022, 3:53 PM  Available via Epic secure chat 7am-7pm After 7 pm, please refer to night coverage provider listed on amion.

## 2022-10-05 NOTE — Interval H&P Note (Signed)
Cath Lab Visit (complete for each Cath Lab visit)  Clinical Evaluation Leading to the Procedure:   ACS: Yes.    Non-ACS:    Anginal Classification: CCS IV  Anti-ischemic medical therapy: Minimal Therapy (1 class of medications)  Non-Invasive Test Results: No non-invasive testing performed  Prior CABG: No previous CABG      History and Physical Interval Note:  10/05/2022 2:44 PM  Bradley Seltzer Serita Grammes Sr.  has presented today for surgery, with the diagnosis of NSTEMI.  The various methods of treatment have been discussed with the patient and family. After consideration of risks, benefits and other options for treatment, the patient has consented to  Procedure(s): RIGHT/LEFT HEART CATH AND CORONARY ANGIOGRAPHY (N/A) as a surgical intervention.  The patient's history has been reviewed, patient examined, no change in status, stable for surgery.  I have reviewed the patient's chart and labs.  Questions were answered to the patient's satisfaction.     Lance Muss

## 2022-10-06 ENCOUNTER — Encounter (HOSPITAL_COMMUNITY): Payer: Self-pay | Admitting: Interventional Cardiology

## 2022-10-06 ENCOUNTER — Ambulatory Visit (HOSPITAL_BASED_OUTPATIENT_CLINIC_OR_DEPARTMENT_OTHER): Payer: BLUE CROSS/BLUE SHIELD

## 2022-10-06 DIAGNOSIS — R079 Chest pain, unspecified: Secondary | ICD-10-CM | POA: Diagnosis not present

## 2022-10-06 DIAGNOSIS — U071 COVID-19: Secondary | ICD-10-CM | POA: Diagnosis not present

## 2022-10-06 DIAGNOSIS — J1282 Pneumonia due to coronavirus disease 2019: Secondary | ICD-10-CM | POA: Diagnosis not present

## 2022-10-06 DIAGNOSIS — R7989 Other specified abnormal findings of blood chemistry: Secondary | ICD-10-CM | POA: Diagnosis not present

## 2022-10-06 LAB — POCT I-STAT 7, (LYTES, BLD GAS, ICA,H+H)
Acid-Base Excess: 0 mmol/L (ref 0.0–2.0)
Bicarbonate: 26.6 mmol/L (ref 20.0–28.0)
Calcium, Ion: 1.22 mmol/L (ref 1.15–1.40)
HCT: 39 % (ref 39.0–52.0)
Hemoglobin: 13.3 g/dL (ref 13.0–17.0)
O2 Saturation: 99 %
Potassium: 4.5 mmol/L (ref 3.5–5.1)
Sodium: 136 mmol/L (ref 135–145)
TCO2: 28 mmol/L (ref 22–32)
pCO2 arterial: 47.7 mmHg (ref 32–48)
pH, Arterial: 7.354 (ref 7.35–7.45)
pO2, Arterial: 126 mmHg — ABNORMAL HIGH (ref 83–108)

## 2022-10-06 LAB — COMPREHENSIVE METABOLIC PANEL
ALT: 23 U/L (ref 0–44)
AST: 18 U/L (ref 15–41)
Albumin: 3.5 g/dL (ref 3.5–5.0)
Alkaline Phosphatase: 59 U/L (ref 38–126)
Anion gap: 5 (ref 5–15)
BUN: 18 mg/dL (ref 8–23)
CO2: 30 mmol/L (ref 22–32)
Calcium: 8.8 mg/dL — ABNORMAL LOW (ref 8.9–10.3)
Chloride: 102 mmol/L (ref 98–111)
Creatinine, Ser: 0.75 mg/dL (ref 0.61–1.24)
GFR, Estimated: >60 mL/min (ref >60)
Glucose, Bld: 134 mg/dL — ABNORMAL HIGH (ref 70–99)
Potassium: 4.1 mmol/L (ref 3.5–5.1)
Sodium: 137 mmol/L (ref 135–145)
Total Bilirubin: 0.9 mg/dL (ref 0.3–1.2)
Total Protein: 6.5 g/dL (ref 6.5–8.1)

## 2022-10-06 LAB — POCT I-STAT EG7
Acid-Base Excess: 1 mmol/L (ref 0.0–2.0)
Acid-Base Excess: 2 mmol/L (ref 0.0–2.0)
Bicarbonate: 27.6 mmol/L (ref 20.0–28.0)
Bicarbonate: 28.8 mmol/L — ABNORMAL HIGH (ref 20.0–28.0)
Calcium, Ion: 1.22 mmol/L (ref 1.15–1.40)
Calcium, Ion: 1.23 mmol/L (ref 1.15–1.40)
HCT: 39 % (ref 39.0–52.0)
HCT: 39 % (ref 39.0–52.0)
Hemoglobin: 13.3 g/dL (ref 13.0–17.0)
Hemoglobin: 13.3 g/dL (ref 13.0–17.0)
O2 Saturation: 67 %
O2 Saturation: 69 %
Potassium: 4.5 mmol/L (ref 3.5–5.1)
Potassium: 4.6 mmol/L (ref 3.5–5.1)
Sodium: 137 mmol/L (ref 135–145)
Sodium: 137 mmol/L (ref 135–145)
TCO2: 29 mmol/L (ref 22–32)
TCO2: 30 mmol/L (ref 22–32)
pCO2, Ven: 53.5 mmHg (ref 44–60)
pCO2, Ven: 55.1 mmHg (ref 44–60)
pH, Ven: 7.321 (ref 7.25–7.43)
pH, Ven: 7.326 (ref 7.25–7.43)
pO2, Ven: 38 mmHg (ref 32–45)
pO2, Ven: 40 mmHg (ref 32–45)

## 2022-10-06 LAB — GLUCOSE, CAPILLARY
Glucose-Capillary: 115 mg/dL — ABNORMAL HIGH (ref 70–99)
Glucose-Capillary: 98 mg/dL (ref 70–99)
Glucose-Capillary: 160 mg/dL — ABNORMAL HIGH (ref 70–99)
Glucose-Capillary: 236 mg/dL — ABNORMAL HIGH (ref 70–99)
Glucose-Capillary: 193 mg/dL — ABNORMAL HIGH (ref 70–99)

## 2022-10-06 LAB — CULTURE, BLOOD (ROUTINE X 2)
Culture: NO GROWTH
Culture: NO GROWTH
Special Requests: ADEQUATE

## 2022-10-06 LAB — CULTURE, RESPIRATORY W GRAM STAIN: Culture: NORMAL

## 2022-10-06 MED ORDER — FUROSEMIDE 40 MG PO TABS
80.0000 mg | ORAL_TABLET | Freq: Every morning | ORAL | Status: DC
  Administered 2022-10-07: 80 mg via ORAL
  Filled 2022-10-06: qty 2

## 2022-10-06 MED ORDER — PREDNISONE 20 MG PO TABS
40.0000 mg | ORAL_TABLET | Freq: Every day | ORAL | Status: DC
  Administered 2022-10-06 – 2022-10-07 (×2): 40 mg via ORAL
  Filled 2022-10-06 (×2): qty 2

## 2022-10-06 MED ORDER — DAPAGLIFLOZIN PROPANEDIOL 10 MG PO TABS
10.0000 mg | ORAL_TABLET | Freq: Every day | ORAL | Status: DC
  Administered 2022-10-07: 10 mg via ORAL
  Filled 2022-10-06 (×2): qty 1

## 2022-10-06 MED ORDER — FUROSEMIDE 40 MG PO TABS
80.0000 mg | ORAL_TABLET | Freq: Once | ORAL | Status: AC
  Administered 2022-10-06: 80 mg via ORAL
  Filled 2022-10-06: qty 2

## 2022-10-06 MED ORDER — BISOPROLOL FUMARATE 5 MG PO TABS
10.0000 mg | ORAL_TABLET | Freq: Every day | ORAL | Status: DC
  Administered 2022-10-07: 10 mg via ORAL
  Filled 2022-10-06: qty 2

## 2022-10-06 MED ORDER — INSULIN ASPART 100 UNIT/ML IJ SOLN
2.0000 [IU] | Freq: Three times a day (TID) | INTRAMUSCULAR | Status: DC
  Administered 2022-10-06: 2 [IU] via SUBCUTANEOUS

## 2022-10-06 MED ORDER — INSULIN ASPART 100 UNIT/ML IJ SOLN: 0.0000 [IU] | Freq: Every day | INTRAMUSCULAR | Status: DC

## 2022-10-06 MED ORDER — BISOPROLOL FUMARATE 5 MG PO TABS
5.0000 mg | ORAL_TABLET | Freq: Once | ORAL | Status: AC
  Administered 2022-10-06: 5 mg via ORAL
  Filled 2022-10-06: qty 1

## 2022-10-06 MED ORDER — FUROSEMIDE 40 MG PO TABS: 40.0000 mg | ORAL_TABLET | Freq: Every evening | ORAL | Status: DC

## 2022-10-06 MED ORDER — INSULIN ASPART 100 UNIT/ML IJ SOLN
0.0000 [IU] | Freq: Three times a day (TID) | INTRAMUSCULAR | Status: DC
  Administered 2022-10-06: 2 [IU] via SUBCUTANEOUS

## 2022-10-06 NOTE — Progress Notes (Addendum)
Progress Note  Patient Name: Bradley [name]. Date of Encounter: 10/06/2022  Primary Cardiologist: [name], MD  Subjective   Doing well post cath but can still feel focal left upper chest discomfort, and some associated shoulder pain associated with specific movements. No SOB. Feeling better than when he presented for admission.  Inpatient Medications    Scheduled Meds:  aspirin EC  81 mg Oral q morning   atorvastatin  20 mg Oral QPM   bisoprolol  5 mg Oral Daily   budesonide  0.5 mg Nebulization BID   calcium-vitamin D  1 tablet Oral q morning   guaiFENesin  600 mg Oral BID   insulin aspart  0-6 Units Subcutaneous Q4H   ipratropium-albuterol  3 mL Nebulization BID   pantoprazole  40 mg Oral QAC breakfast   predniSONE  40 mg Oral Q breakfast   sodium chloride flush  3 mL Intravenous Q12H   sodium chloride flush  3 mL Intravenous Q12H   tamsulosin  0.4 mg Oral QPC supper   Continuous Infusions:  sodium chloride     PRN Meds: sodium chloride, acetaminophen, acetaminophen, levalbuterol, ondansetron **OR** ondansetron (ZOFRAN) IV, ondansetron (ZOFRAN) IV, sodium chloride flush   Vital Signs    Vitals:   10/05/22 2322 10/06/22 0422 10/06/22 0741 10/06/22 0811  BP: 138/83 (!) 140/85 (!) 151/99   Pulse: 70 71 77   Resp: 18 19 (!) 21   Temp: 98 F (36.7 C) 97.6 F (36.4 C) 98 F (36.7 C)   TempSrc:  Oral Oral   SpO2: 95% 93% 100% 100%  Weight:  80.1 kg    Height:        Intake/Output Summary (Last 24 hours) at 10/06/2022 1038 Last data filed at 10/06/2022 0924 Gross per 24 hour  Intake 1394 ml  Output 750 ml  Net 644 ml      10/06/2022    4:22 AM 10/05/2022    5:00 AM 10/04/2022    5:00 AM  Last 3 Weights  Weight (lbs) 176 lb 9.4 oz 168 lb 6.9 oz 176 lb 2.4 oz  Weight (kg) 80.1 kg 76.4 kg 79.9 kg     Telemetry    Correction to prior note: chronic afib with V Paced rhythm. No further NSVT - Personally Reviewed  ECG    No new tracings -  Personally Reviewed  Physical Exam   GEN: No acute distress.  HEENT: Normocephalic, atraumatic, sclera non-icteric. Neck: No JVD or bruits. Cardiac: RRR no murmurs, rubs, or gallops.  Respiratory: Diffusely mildly diminished and coarse bilaterally without wheezing or rales. Breathing is unlabored.  Coarse fibrotic breath sounds   GI: Soft, nontender, non-distended, BS +x 4. MS: no deformity. Extremities: No clubbing or cyanosis. No edema. Distal pedal pulses are 2+ and equal bilaterally. Right radial cath site without hematoma or ecchymosis; good pulse. Neuro:  AAOx3. Follows commands. Psych:  Responds to questions appropriately with a normal affect.  Labs    High Sensitivity Troponin:   Recent Labs  Lab 10/01/22 1255 10/01/22 1428 10/02/22 0851 10/02/22 1043  TROPONINIHS 577* 685* 218* 216*      Cardiac EnzymesNo results for input(s): "TROPONINI" in the last 168 hours. No results for input(s): "TROPIPOC" in the last 168 hours.   Chemistry Recent Labs  Lab 10/01/22 1428 10/02/22 0542 10/04/22 0448 10/05/22 0420  NA 137 141 138 138  K 4.7 4.2 3.7 3.9  CL 102 103 104 104  CO2 24 27 30 28   GLUCOSE  141* 164* 128* 105*  BUN 12 19 22 16   CREATININE 0.71 0.85 0.72 0.55*  CALCIUM 8.5* 8.9 8.4* 8.7*  PROT 7.2 6.9  --   --   ALBUMIN 3.8 3.7  --   --   AST 37 24  --   --   ALT 15 17  --   --   ALKPHOS 75 64  --   --   BILITOT 1.4* 1.0  --   --   GFRNONAA >60 >60 >60 >60  ANIONGAP 11 11 4* 6     Hematology Recent Labs  Lab 10/03/22 0450 10/04/22 0448 10/05/22 0420  WBC 7.2 9.1 6.9  RBC 3.83* 3.97* 4.23  HGB 12.2* 12.5* 13.2  HCT 36.3* 38.5* 39.6  MCV 94.8 97.0 93.6  MCH 31.9 31.5 31.2  MCHC 33.6 32.5 33.3  RDW 15.1 14.8 14.3  PLT 206 210 225    BNP Recent Labs  Lab 10/01/22 1255  BNP 188.5*     DDimer  Recent Labs  Lab 10/02/22 0851  DDIMER 0.45     Radiology    CARDIAC CATHETERIZATION  Addendum Date: 10/05/2022     The left ventricular  systolic function is normal.   LV end diastolic pressure is normal.   The left ventricular ejection fraction is 50-55% by visual estimate.   Hemodynamic findings consistent with mild pulmonary hypertension.   There is no aortic valve stenosis.   Minimal, nonobstructive coronary artery disease.   Ao sat 99%, PA sat 68%, mean RA pressure 8 mmHg, PA pressure 43/17, mean PA pressure 29 mmHg, pulmonary capillary wedge pressure 13/15, mean pulmonary capillary wedge pressure 12 mmHg, cardiac output 4.60 L/min, cardiac index 2.39.   Tortuous right radial artery with radial loop.  This was straightened with an interventional wire and the patient tolerated this well.  We were able to complete the procedure from the right radial approach.  If cath was needed in the future, would consider alternative access. Continue preventive therapy from cardiac standpoint.  Plan is for him to go back to [facility] for further pulmonary management.  Result Date: 10/05/2022   The left ventricular systolic function is normal.   LV end diastolic pressure is normal.   The left ventricular ejection fraction is 50-55% by visual estimate.   Hemodynamic findings consistent with mild pulmonary hypertension.   There is no aortic valve stenosis.   Minimal, nonobstructive coronary artery disease.   Ao sat 99%, PA sat 68%, mean RA pressure 8 mmHg, PA pressure 43/17, mean PA pressure 29 mmHg, pulmonary capillary wedge pressure 13/15, mean pulmonary capillary wedge pressure 12 mmHg, cardiac output 4.60 L/min, cardiac index 2.39. Continue preventive therapy from cardiac standpoint.  Plan is for him to go back to [facility] for further pulmonary management.    Cardiac Studies   [facility] 10/05/22    The left ventricular systolic function is normal.   LV end diastolic pressure is normal.   The left ventricular ejection fraction is 50-55% by visual estimate.   Hemodynamic findings consistent with mild pulmonary hypertension.    There is no aortic valve stenosis.   Minimal, nonobstructive coronary artery disease.   Ao sat 99%, PA sat 68%, mean RA pressure 8 mmHg, PA pressure 43/17, mean PA pressure 29 mmHg, pulmonary capillary wedge pressure 13/15, mean pulmonary capillary wedge pressure 12 mmHg, cardiac output 4.60 L/min, cardiac index 2.39.   Tortuous right radial artery with radial loop.  This was straightened with an  interventional wire and the patient tolerated this well.  We were able to complete the procedure from the right radial approach.  If cath was needed in the future, would consider alternative access.   Continue preventive therapy from cardiac standpoint.  Plan is for him to go back to [facility] for further pulmonary management.   2D echo 10/03/22  1. Left ventricular ejection fraction, by estimation, is 55 to 60%. The  left ventricle has normal function. Left ventricular endocardial border  not optimally defined to evaluate regional wall motion. Left ventricular  diastolic parameters are  indeterminate.   2. Right ventricular systolic function is mildly reduced. The right  ventricular size is normal. There is moderately elevated pulmonary artery  systolic pressure. The estimated right ventricular systolic pressure is  50.8 mmHg.   3. Left atrial size was mildly dilated.   4. Right atrial size was mildly dilated.   5. The mitral valve is degenerative. Trivial mitral valve regurgitation.  No evidence of mitral stenosis.   6. The aortic valve is tricuspid. There is mild calcification of the  aortic valve. There is mild thickening of the aortic valve. Aortic valve  regurgitation is not visualized. No aortic stenosis is present.   7. The inferior vena cava is normal in size with <50% respiratory  variability, suggesting right atrial pressure of 8 mmHg.   Conclusion(s)/Recommendation(s): Consider limited echocardiogram with  Definity contrast to better assess regional wall motion if  clinically  relevant.   Patient Profile     81 y.o. male with chronic atrial fibrillation s/p AVN ablation and CRT-P, chronic diastolic CHF (with prior reduction of EF in setting of AF RVR, improved), HTN, HLD, prior ICH (no anticoagulation), COPD, chronic hypoxic respiratory failure, prior admissions for Covid/URIs, question of prior ILD, PAD (details unclear), NSVT by recent device interrogation. Has CAD listed in PMH with cath 2005 with minimal disease but repeat cath 2008 free of significant CAD. Admitted 07/2022 with multifactorial resp failure, + rhinovirus, COPD/CHF. Admitted twice in 08/2022 with Covid then PNA. Readmitted 12/22 with worsening chest discomfort and progressive DOE.    Assessment & Plan    1. Chest discomfort/DOE with elevated troponin - hsTroponin [phone]- [id number], suspected demand ischemia since cath showed only minimal CAD - [facility] 10/05/22 showed minimal nonobstructive coronary disease - RHC showed normal LVEDP, Ao sat 99%, PA sat 68%, mean RA pressure 8 mmHg, PA pressure 43/17, mean PA pressure 29 mmHg, pulmonary capillary wedge pressure 13/15, mean pulmonary capillary wedge pressure 12 mmHg, cardiac output 4.60 L/min, cardiac index 2.39, hemodynamic findings consistent with mild pulmonary hypertension.  - on ASA, atorvastatin 20mg  daily, bisoprolol 5mg  daily - lipids/CMET were ordered for this AM but not yet drawn, placed care order for nurse to contact lab to draw   2. Chronic diastolic CHF, HTN, mild pulm HTN - RHC findings as above - will discuss med plan with MD - was getting intermittent IV/oral Lasix here, held yesterday for cath - home meds include Lasix 40mg  (taking morning, noon and night), amlodipine 10mg  daily (not restarted here),  bisoprolol 10mg  daily (on 5mg  here), Farixga 10mg  daily (not restarted here)   3. Chronic AF, AVN ablation, CRT-P - will need OP EP f/u for gen change per notes, outpatient visit scheduled 10/14/21 - not on OAC due to h/o ICH    4. 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 Abnormal TSH, suppressed - FT4 wnl, Ft3 slightly low- per medicine team   6. NSVT - per 12/21 ph note, 3 episodes of short NSVT and one episode >20 beats in monitor only zone - no recurrent episodes since yesterday - K 3.9, Mg 2.5 - f/u labs still pending, would keep K 4.0 or greater - keep EP f/u as scheduled 10/14/22   He has EP f/u already scheduled 1/4 Also arranged post-hospital gen cards f/w at [facility] on 10/19/22  For questions or updates, please contact [facility] Please consult [email or web address] for contact info under Cardiology/STEMI.  Signed, Laurann Montana, PA-C 10/06/2022, 10:38 AM     Attending Note:   The patient was seen and examined.  Agree with assessment and plan as noted above.  Changes made to the above note as needed.  Patient seen and independently examined with Ronie Spies, PA .   We discussed all aspects of the encounter. I agree with the assessment and plan as stated above.     + troponins:  due to demand ischemia , cath yesterday revealed only minimal CAD  Follow up with Dr. Duke Salvia     2.  Mild pulmonary HTN :  followed in the advanced CHF clinic  Follow up [facility]     I have spent a total of 40 minutes with patient reviewing hospital  notes , telemetry, EKGs, labs and examining patient as well as establishing an assessment and plan that was discussed with the patient.  > 50% of time was spent in direct patient care.    Vesta Mixer, Montez Hageman., MD, [facility] 10/06/2022, 11:54 AM [address],  [address] Office [phone] Pager [phone]

## 2022-10-06 NOTE — Progress Notes (Signed)
[facility] PROGRESS NOTE    Progress Note  Bradley [name]  TOI:[id number] DOB: [birth year] DOA: 10/01/2022 PCP: [name], MD     Brief Narrative:   Bradley Searles Sr. is an 81 y.o. male Past medical history significant of anemia, osteoarthritis, chronic atrial fibrillation status post AV nodal ablation with a biventricular pacer, BPH bronchitis, chronic diastolic heart failure pulmonary hypertension on 5 L of oxygen COPD, chronic respiratory failure with hypoxia, recently diagnosed for COVID-pneumonia 08/23/2022 was treated with a COPD and COVID with good response and discharged home turns to the ED with 1 month of dyspnea on exertion and shortness of breath   Assessment/Plan:   Acute on chronic respiratory failure with hypoxia due to  Pneumonia due to COVID-19 virus probable COPD: He remains on 4 L of oxygen. Reports intermittent PND. Continue steroids will need to go on home steroid taper. CT of the chest showed patchy opacities of the right lower lobe mosaic pattern most consistent with small airway disease. Continue inhalers.  Persistent COVID-19 positivity: He completed course of steroids. Will go ahead and consult ID.   Elevated troponins: He was reporting PND waking up in the middle of the night complaining of shortness of breath. Cardiology was consulted cardiac cath done that revealed only minimal CAD follow-up with Dr. Duke Salvia as an outpatient.  Anemia of chronic disease: Noted.  Paroxysmal atrial fibrillation with a history of nodal ablation: Status post pacemaker placement. Change of battery next month.  Pulmonary hypertension: Right heart cath showed normal left ventricular end-diastolic pressure with a mean RA pressure of 8 mg of mercury and a capillary wedge pressure of 13/15 cardiac output of 4.6 and a cardiac index of 2.3 findings compatible with pulmonary hypertension. Continue aspirin statin and bisoprolol  History of intracranial  hemorrhage: No active issues.  Diabetes mellitus type 2: Blood glucose relatively well-controlled continue sliding scale insulin.    DVT prophylaxis: lovenox Family Communication:none Status is: Inpatient Remains inpatient appropriate because: Acute acute respiratory failure with hypoxia    Code Status:     Code Status Orders  (From admission, onward)           Start     Ordered   10/01/22 2020  Full code  Continuous       Question:  By:  Answer:  Consent: discussion documented in EHR   10/01/22 2022           Code Status History     Date Active Date Inactive Code Status Order ID Comments User Context   08/27/2022 0821 08/30/2022 2048 Full Code [id number]  Bobette Mo, MD ED   08/23/2022 1025 08/25/2022 1829 Full Code [id number]  Emeline General, MD ED   07/18/2022 2350 07/23/2022 1856 Full Code [id number]  Floydene Flock, MD Inpatient   08/29/2021 2141 09/02/2021 1810 Full Code [id number]  Briscoe Deutscher, MD ED   07/29/2021 2251 08/03/2021 1727 Full Code [id number]  Shalhoub, Deno Lunger, MD ED   03/05/2021 0411 03/12/2021 0149 Full Code [id number]  Briscoe Deutscher, MD ED   09/13/2019 2240 09/19/2019 1315 Full Code [id number]  Eduard Clos, MD ED   05/07/2019 1603 05/11/2019 1600 Full Code [id number]  Jonah Blue, MD ED   12/22/2018 0206 12/26/2018 1457 Full Code [id number]  Briscoe Deutscher, MD ED   09/19/2018 2203 09/25/2018 1711 Full Code [id number]  Elder Love, MD Inpatient   05/25/2018 1627 05/28/2018 1837 Full Code [id number]  Merlene Laughter,  DO Inpatient   02/21/2017 1608 02/24/2017 1701 DNR [id number]  Alba Cory, MD Inpatient   12/19/2015 0334 12/20/2015 1929 Full Code [id number]  Lorretta Harp, MD Inpatient   08/22/2014 1700 08/25/2014 1741 Full Code [id number]  Jerald Kief, MD Inpatient   09/27/2012 1454 09/29/2012 1704 Full Code [id number]  Marissa Nestle, RN Inpatient   07/23/2012 2231 07/26/2012 2005 Full Code [id number]  Eduard Clos, MD Inpatient   07/06/2012 0741 07/10/2012 1715 Full Code [id number]  Celesta Gentile, RN Inpatient   09/04/2011 1800 09/08/2011 1840 Full Code [id number]  Cathren Harsh, MD Inpatient      Advance Directive Documentation    Flowsheet Row Most Recent Value  Type of Advance Directive Healthcare Power of Attorney  Pre-existing out of facility DNR order (yellow form or pink MOST form) --  "MOST" Form in Place? --         IV Access:   Peripheral IV   Procedures and diagnostic studies:   CARDIAC CATHETERIZATION  Addendum Date: 10/05/2022     The left ventricular systolic function is normal.   LV end diastolic pressure is normal.   The left ventricular ejection fraction is 50-55% by visual estimate.   Hemodynamic findings consistent with mild pulmonary hypertension.   There is no aortic valve stenosis.   Minimal, nonobstructive coronary artery disease.   Ao sat 99%, PA sat 68%, mean RA pressure 8 mmHg, PA pressure 43/17, mean PA pressure 29 mmHg, pulmonary capillary wedge pressure 13/15, mean pulmonary capillary wedge pressure 12 mmHg, cardiac output 4.60 L/min, cardiac index 2.39.   Tortuous right radial artery with radial loop.  This was straightened with an interventional wire and the patient tolerated this well.  We were able to complete the procedure from the right radial approach.  If cath was needed in the future, would consider alternative access. Continue preventive therapy from cardiac standpoint.  Plan is for him to go back to [facility] for further pulmonary management.  Result Date: 10/05/2022   The left ventricular systolic function is normal.   LV end diastolic pressure is normal.   The left ventricular ejection fraction is 50-55% by visual estimate.   Hemodynamic findings consistent with mild pulmonary hypertension.   There is no aortic valve stenosis.   Minimal, nonobstructive coronary artery disease.   Ao sat 99%, PA sat 68%, mean RA pressure 8 mmHg, PA  pressure 43/17, mean PA pressure 29 mmHg, pulmonary capillary wedge pressure 13/15, mean pulmonary capillary wedge pressure 12 mmHg, cardiac output 4.60 L/min, cardiac index 2.39. Continue preventive therapy from cardiac standpoint.  Plan is for him to go back to [facility] for further pulmonary management.     Medical Consultants:   None.   Subjective:    Bradley Searles Sr. relates his breathing is unchanged.  Objective:    Vitals:   10/06/22 0422 10/06/22 0741 10/06/22 0811 10/06/22 1144  BP: (!) 140/85 (!) 151/99  (!) 127/92  Pulse: 71 77  71  Resp: 19 (!) 21  20  Temp: 97.6 F (36.4 C) 98 F (36.7 C)  97.7 F (36.5 C)  TempSrc: Oral Oral  Oral  SpO2: 93% 100% 100% 100%  Weight: 80.1 kg     Height:       SpO2: 100 % O2 Flow Rate (L/min): 4 L/min FiO2 (%): 32 %   Intake/Output Summary (Last 24 hours) at 10/06/2022 1233 Last data filed at 10/06/2022 0924 Gross per 24  hour  Intake 1394 ml  Output 750 ml  Net 644 ml   Filed Weights   10/04/22 0500 10/05/22 0500 10/06/22 0422  Weight: 79.9 kg 76.4 kg 80.1 kg    Exam: General exam: In no acute distress. Respiratory system: Good air movement and clear to auscultation. Cardiovascular system: S1 & S2 heard, RRR. No JVD. Gastrointestinal system: Abdomen is nondistended, soft and nontender.  Extremities: No pedal edema. Skin: No rashes, lesions or ulcers Psychiatry: Judgement and insight appear normal. Mood & affect appropriate.    Data Reviewed:    Labs: Basic Metabolic Panel: Recent Labs  Lab 10/01/22 1428 10/02/22 0542 10/04/22 0448 10/05/22 0420 10/05/22 1502 10/05/22 1506  NA 137 141 138 138 136 137  137  K 4.7 4.2 3.7 3.9 4.5 4.6  4.5  CL 102 103 104 104  --   --   CO2 24 27 30 28   --   --   GLUCOSE 141* 164* 128* 105*  --   --   BUN 12 19 22 16   --   --   CREATININE 0.71 0.85 0.72 0.55*  --   --   CALCIUM 8.5* 8.9 8.4* 8.7*  --   --   MG  --   --  2.5*  --   --   --   PHOS  --    --  2.6  --   --   --    GFR Estimated Creatinine Clearance: 72.4 mL/min (A) (by C-G formula based on SCr of 0.55 mg/dL (L)). Liver Function Tests: Recent Labs  Lab 10/01/22 1428 10/02/22 0542  AST 37 24  ALT 15 17  ALKPHOS 75 64  BILITOT 1.4* 1.0  PROT 7.2 6.9  ALBUMIN 3.8 3.7   No results for input(s): "LIPASE", "AMYLASE" in the last 168 hours. No results for input(s): "AMMONIA" in the last 168 hours. Coagulation profile No results for input(s): "INR", "PROTIME" in the last 168 hours. COVID-19 Labs  No results for input(s): "DDIMER", "FERRITIN", "LDH", "CRP" in the last 72 hours.  Lab Results  Component Value Date   SARSCOV2NAA POSITIVE (A) 10/01/2022   SARSCOV2NAA POSITIVE (A) 08/23/2022   SARSCOV2NAA NEGATIVE 07/18/2022   SARSCOV2NAA NEGATIVE 08/29/2021    CBC: Recent Labs  Lab 10/01/22 1255 10/02/22 0542 10/03/22 0450 10/04/22 0448 10/05/22 0420 10/05/22 1502 10/05/22 1506  WBC 9.8 9.9 7.2 9.1 6.9  --   --   NEUTROABS 7.5  --   --   --   --   --   --   HGB 14.2 13.3 12.2* 12.5* 13.2 13.3 13.3  13.3  HCT 43.4 39.4 36.3* 38.5* 39.6 39.0 39.0  39.0  MCV 96.4 94.9 94.8 97.0 93.6  --   --   PLT 198 223 206 210 225  --   --    Cardiac Enzymes: No results for input(s): "CKTOTAL", "CKMB", "CKMBINDEX", "TROPONINI" in the last 168 hours. BNP (last 3 results) No results for input(s): "PROBNP" in the last 8760 hours. CBG: Recent Labs  Lab 10/05/22 1238 10/05/22 2207 10/06/22 0418 10/06/22 0736 10/06/22 1143  GLUCAP 140* 194* 115* 98 160*   D-Dimer: No results for input(s): "DDIMER" in the last 72 hours. Hgb A1c: No results for input(s): "HGBA1C" in the last 72 hours. Lipid Profile: No results for input(s): "CHOL", "HDL", "LDLCALC", "TRIG", "CHOLHDL", "LDLDIRECT" in the last 72 hours. Thyroid function studies: No results for input(s): "TSH", "T4TOTAL", "T3FREE", "THYROIDAB" in the last 72 hours.  Invalid input(s): "  FREET3" Anemia work up: No results  for input(s): "VITAMINB12", "FOLATE", "FERRITIN", "TIBC", "IRON", "RETICCTPCT" in the last 72 hours. Sepsis Labs: Recent Labs  Lab 10/01/22 2112 10/02/22 0542 10/03/22 0450 10/04/22 0448 10/05/22 0420  PROCALCITON <0.10  --   --   --   --   WBC  --  9.9 7.2 9.1 6.9   Microbiology Recent Results (from the past 240 hour(s))  Expectorated Sputum Assessment w Gram Stain, Rflx to Resp Cult     Status: None   Collection Time: 10/01/22  5:44 AM   Specimen: Sputum  Result Value Ref Range Status   Specimen Description SPUTUM  Final   Special Requests NONE  Final   Sputum evaluation   Final    THIS SPECIMEN IS ACCEPTABLE FOR SPUTUM CULTURE Performed at [facility], [address] [place], [place] [address]    Report Status 10/02/2022 FINAL  Final  Culture, Respiratory w Gram Stain     Status: None (Preliminary result)   Collection Time: 10/01/22  5:44 AM   Specimen: SPU  Result Value Ref Range Status   Specimen Description   Final    SPUTUM Performed at [facility], [address] [place], [place] [address]    Special Requests   Final    NONE Reflexed from [id number] Performed at [facility], [address] [place], [place] [address]    Gram Stain   Final    MODERATE GRAM POSITIVE COCCI IN PAIRS IN CHAINS FEW GRAM NEGATIVE RODS RARE YEAST FEW SQUAMOUS EPITHELIAL CELLS PRESENT FEW WBC PRESENT, PREDOMINANTLY PMN    Culture   Final    CULTURE REINCUBATED FOR BETTER GROWTH Performed at [facility], [address], [place], [place] [address]    Report Status PENDING  Incomplete  Resp panel by RT-PCR (RSV, Flu A&B, Covid) Anterior Nasal Swab     Status: Abnormal   Collection Time: 10/01/22 12:46 PM   Specimen: Anterior Nasal Swab  Result Value Ref Range Status   SARS Coronavirus 2 by RT PCR POSITIVE (A) NEGATIVE Final    Comment: (NOTE) SARS-CoV-2 target nucleic acids are DETECTED.  The SARS-CoV-2 RNA is  generally detectable in upper respiratory specimens during the acute phase of infection. Positive results are indicative of the presence of the identified virus, but do not rule out bacterial infection or co-infection with other pathogens not detected by the test. Clinical correlation with patient history and other diagnostic information is necessary to determine patient infection status. The expected result is Negative.  Fact Sheet for Patients: BloggerCourse.com  Fact Sheet for Healthcare Providers: SeriousBroker.it  This test is not yet approved or cleared by the [place] FDA and  has been authorized for detection and/or diagnosis of SARS-CoV-2 by FDA under an Emergency Use Authorization (EUA).  This EUA will remain in effect (meaning this test can be used) for the duration of  the COVID-19 declaration under Section 564(b)(1) of the A ct, 21 U.S.C. section 360bbb-3(b)(1), unless the authorization is terminated or revoked sooner.     Influenza A by PCR NEGATIVE NEGATIVE Final   Influenza B by PCR NEGATIVE NEGATIVE Final    Comment: (NOTE) The Xpert Xpress SARS-CoV-2/FLU/RSV plus assay is intended as an aid in the diagnosis of influenza from Nasopharyngeal swab specimens and should not be used as a sole basis for treatment. Nasal washings and aspirates are unacceptable for Xpert Xpress SARS-CoV-2/FLU/RSV testing.  Fact Sheet for Patients: BloggerCourse.com  Fact Sheet for Healthcare Providers: SeriousBroker.it  This test is not yet approved or cleared by the [place] FDA and has been authorized for detection and/or diagnosis of SARS-CoV-2 by FDA under an Emergency Use Authorization (EUA). This EUA will remain in effect (meaning this test can be used) for the duration of the COVID-19 declaration under Section 564(b)(1) of the Act, 21 U.S.C. section 360bbb-3(b)(1),  unless the authorization is terminated or revoked.     Resp Syncytial Virus by PCR NEGATIVE NEGATIVE Final    Comment: (NOTE) Fact Sheet for Patients: BloggerCourse.com  Fact Sheet for Healthcare Providers: SeriousBroker.it  This test is not yet approved or cleared by the [place] FDA and has been authorized for detection and/or diagnosis of SARS-CoV-2 by FDA under an Emergency Use Authorization (EUA). This EUA will remain in effect (meaning this test can be used) for the duration of the COVID-19 declaration under Section 564(b)(1) of the Act, 21 U.S.C. section 360bbb-3(b)(1), unless the authorization is terminated or revoked.  Performed at [facility], [address] [place], [place] [address]   Culture, blood (Routine X 2) w Reflex to ID Panel     Status: None (Preliminary result)   Collection Time: 10/01/22  9:12 PM   Specimen: BLOOD  Result Value Ref Range Status   Specimen Description   Final    BLOOD BLOOD RIGHT WRIST Performed at [facility], [address] [place], [place] [address]    Special Requests   Final    BOTTLES DRAWN AEROBIC AND ANAEROBIC Blood Culture results may not be optimal due to an inadequate volume of blood received in culture bottles Performed at [facility], [address], [place], [place] [address]    Culture   Final    NO GROWTH 4 DAYS Performed at [facility], [address] [place], [place] [address]    Report Status PENDING  Incomplete  Culture, blood (Routine X 2) w Reflex to ID Panel     Status: None (Preliminary result)   Collection Time: 10/01/22  9:59 PM   Specimen: BLOOD  Result Value Ref Range Status   Specimen Description   Final    BLOOD BLOOD LEFT HAND Performed at [facility], [address] [place], [place] [address]    Special Requests   Final    BOTTLES DRAWN AEROBIC AND  ANAEROBIC Blood Culture adequate volume Performed at [facility], [address], [place], [place] [address]    Culture   Final    NO GROWTH 4 DAYS Performed at [facility], [address], [place], [place] [address]    Report Status PENDING  Incomplete  MRSA Next Gen by PCR, Nasal     Status: None   Collection Time: 10/01/22 10:40 PM   Specimen: Nasal Mucosa; Nasal Swab  Result Value Ref Range Status   MRSA by PCR Next Gen NOT DETECTED NOT DETECTED Final    Comment: (NOTE) The GeneXpert MRSA Assay (FDA approved for NASAL specimens only), is one component of a comprehensive MRSA colonization surveillance program. It is not intended to diagnose MRSA infection nor to guide or monitor treatment for MRSA infections. Test performance is not FDA approved in patients less than 41 years old. Performed at [facility], [address] [place], [place] [address]      Medications:    aspirin EC  81 mg Oral q morning   atorvastatin  20 mg Oral QPM   bisoprolol  5 mg Oral Daily   budesonide  0.5 mg Nebulization BID   calcium-vitamin D  1 tablet Oral q morning   guaiFENesin  600 mg Oral BID   insulin aspart  0-6 Units Subcutaneous Q4H   ipratropium-albuterol  3 mL Nebulization BID   pantoprazole  40 mg Oral QAC breakfast   predniSONE  40 mg Oral Q breakfast   sodium chloride flush  3 mL Intravenous Q12H   sodium chloride flush  3 mL Intravenous Q12H   tamsulosin  0.4 mg Oral QPC supper   Continuous Infusions:  sodium chloride        LOS: 5 days   Marinda Elk  [facility]  10/06/2022, 12:33 PM

## 2022-10-06 NOTE — Plan of Care (Signed)
  Problem: Education: Goal: Knowledge of General Education information will improve Description Including pain rating scale, medication(s)/side effects and non-pharmacologic comfort measures Outcome: Progressing   Problem: Health Behavior/Discharge Planning: Goal: Ability to manage health-related needs will improve Outcome: Progressing   

## 2022-10-06 NOTE — Progress Notes (Signed)
Meds discussed with Dr. Elease Hashimoto. He suggests to increase bisoprolol back to home dose of 10mg  daily, resume Farxiga in AM, and restart Lasix at previous recommended home dose of 80mg  QAM/40mg  QPM. Given late nature of the day will do 80mg  orally today then restart this dosing tomorrow. Can hold off restarting amlodipine until outpatient or as dictated by BP (last value 127/92). His labs still are not crossing over, the CMET/lipid panel ordered yesterday for this morning. Will order CMET now and lipid panel in AM if still here.

## 2022-10-07 DIAGNOSIS — U071 COVID-19: Secondary | ICD-10-CM | POA: Diagnosis not present

## 2022-10-07 DIAGNOSIS — R7989 Other specified abnormal findings of blood chemistry: Secondary | ICD-10-CM | POA: Diagnosis not present

## 2022-10-07 DIAGNOSIS — I509 Heart failure, unspecified: Secondary | ICD-10-CM | POA: Diagnosis not present

## 2022-10-07 DIAGNOSIS — J1282 Pneumonia due to coronavirus disease 2019: Secondary | ICD-10-CM | POA: Diagnosis not present

## 2022-10-07 LAB — BASIC METABOLIC PANEL
Anion gap: 7 (ref 5–15)
BUN: 15 mg/dL (ref 8–23)
CO2: 30 mmol/L (ref 22–32)
Calcium: 8.6 mg/dL — ABNORMAL LOW (ref 8.9–10.3)
Chloride: 99 mmol/L (ref 98–111)
Creatinine, Ser: 0.52 mg/dL — ABNORMAL LOW (ref 0.61–1.24)
GFR, Estimated: >60 mL/min (ref >60)
Glucose, Bld: 122 mg/dL — ABNORMAL HIGH (ref 70–99)
Potassium: 4.2 mmol/L (ref 3.5–5.1)
Sodium: 136 mmol/L (ref 135–145)

## 2022-10-07 LAB — GLUCOSE, CAPILLARY
Glucose-Capillary: 116 mg/dL — ABNORMAL HIGH (ref 70–99)
Glucose-Capillary: 145 mg/dL — ABNORMAL HIGH (ref 70–99)

## 2022-10-07 LAB — CBC
HCT: 41.3 % (ref 39.0–52.0)
Hemoglobin: 13.9 g/dL (ref 13.0–17.0)
MCH: 31.9 pg (ref 26.0–34.0)
MCHC: 33.7 g/dL (ref 30.0–36.0)
MCV: 94.7 fL (ref 80.0–100.0)
Platelets: 246 10*3/uL (ref 150–400)
RBC: 4.36 MIL/uL (ref 4.22–5.81)
RDW: 14.3 % (ref 11.5–15.5)
WBC: 9.8 10*3/uL (ref 4.0–10.5)
nRBC: 0 % (ref 0.0–0.2)

## 2022-10-07 LAB — LIPID PANEL
Cholesterol: 180 mg/dL (ref 0–200)
HDL: 58 mg/dL (ref >40)
LDL Cholesterol: 109 mg/dL — ABNORMAL HIGH (ref 0–99)
Total CHOL/HDL Ratio: 3.1 RATIO
Triglycerides: 67 mg/dL (ref <150)
VLDL: 13 mg/dL (ref 0–40)

## 2022-10-07 LAB — LIPOPROTEIN A (LPA): Lipoprotein (a): 102 nmol/L — ABNORMAL HIGH (ref <75.0)

## 2022-10-07 NOTE — Discharge Summary (Addendum)
Physician Discharge Summary  Bradley [name]. ZOX:[id number] DOB: July 05, 1941 DOA: 10/01/2022  PCP: Bradley Plump, MD  Admit date: 10/01/2022 Discharge date: 10/07/2022  Admitted From: Home Disposition:  Home  Recommendations for Outpatient Follow-up:  Follow up with PCP in 1-2 weeks Please obtain BMP/CBC in one week   Home Health:No Equipment/Devices:None  Discharge Condition:Stable CODE STATUS:Full Diet recommendation: Heart Healthy  Brief/Interim Summary: 81 y.o. male Past medical history significant of anemia, osteoarthritis, chronic atrial fibrillation status post AV nodal ablation with a biventricular pacer, BPH bronchitis, chronic diastolic heart failure pulmonary hypertension on 5 L of oxygen COPD, chronic respiratory failure with hypoxia, recently diagnosed for COVID-pneumonia 08/23/2022 was treated with a COPD and COVID with good response and discharged home turns to the ED with 1 month of dyspnea on exertion and shortness of breath   Discharge Diagnoses:  Principal Problem:   Pneumonia due to COVID-19 virus Active Problems:   Acute on chronic congestive heart failure (HCC)   Acute on chronic combined systolic and diastolic CHF (congestive heart failure) (HCC)  Acute respiratory failure with hypoxia probably due to pneumonia and COPD: He was started on IV steroids antibiotics and inhalers he completed steroid and antibiotic treatment in house. No change only to his inhalers.  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  Elevated troponins: Cardiology was consulted perform a cardiac cath that showed minimal disease will follow-up with Bradley [name] as an outpatient. Likely demand ischemia.  Anemia of chronic disease: Noted.  Paroxysmal atrial fibrillation with a history of nodal ablation: Status post pacemaker battery change next month no changes made.  Pulmonary hypertension: Right heart cath showed normal  left ventricular end-diastolic pressure with a mean RA pressure of 8 mg of mercury and a capillary wedge pressure of 13/15 cardiac output of 4.6 and a cardiac index of 2.3 findings compatible with pulmonary hypertension. Continue aspirin statin and bisoprolol.  History of intracranial hemorrhage: No active issues not a candidate for anticoagulation.  Diabetes mellitus type 2 with hyperglycemia: Blood glucose is well-controlled no change made to his medication.  Discharge Instructions  Discharge Instructions     Diet - low sodium heart healthy   Complete by: As directed    Increase activity slowly   Complete by: As directed       Allergies as of 10/07/2022       Reactions   Hydrocodone Other (See Comments)   "given to him in the hospital; went thru withdrawals once home; dr said not to take it again" (09/27/2012) dizziness   Ultram [tramadol] Other (See Comments)   "given to him in the hospital; went thru withdrawals once home; dr said not to take it again" (09/27/2012) dizziness   Cialis [tadalafil] Other (See Comments)   Headache Backache  Dizziness    Avelox [moxifloxacin Hydrochloride] Itching, Other (See Comments)   Headache Dizziness        Medication List     TAKE these medications    acetaminophen 500 MG tablet Commonly known as: TYLENOL Take 1,000 mg by mouth every 8 (eight) hours as needed for headache or moderate pain.   albuterol 108 (90 Base) MCG/ACT inhaler Commonly known as: VENTOLIN HFA Inhale 2 puffs into the lungs every 6 (six) hours as needed for wheezing or shortness of breath. What changed:  how much to take when to take this   amLODipine 10 MG tablet Commonly known as: NORVASC Take 1 tablet (10 mg total) by mouth  daily. What changed: when to take this   aspirin 81 MG tablet Take 81 mg by mouth every morning.   atorvastatin 20 MG tablet Commonly known as: LIPITOR Take 1 tablet (20 mg total) by mouth at bedtime. What changed: when  to take this   bisoprolol 10 MG tablet Commonly known as: ZEBETA Take 1 tablet (10 mg total) by mouth daily.   budesonide 0.5 MG/2ML nebulizer solution Commonly known as: PULMICORT Take 2 mLs (0.5 mg total) by nebulization 2 (two) times daily.   calcium-vitamin D 500-200 MG-UNIT tablet Commonly known as: OSCAL WITH D Take 1 tablet by mouth every morning.   cholecalciferol 25 MCG (1000 UNIT) tablet Commonly known as: VITAMIN D3 Take 1,000 Units by mouth every evening.   diphenhydramine-acetaminophen 25-500 MG Tabs tablet Commonly known as: TYLENOL PM Take 1 tablet by mouth at bedtime as needed (sleep).   Farxiga 10 MG Tabs tablet Generic drug: dapagliflozin propanediol TAKE 1 TABLET DAILY BEFORE BREAKFAST What changed:  how much to take when to take this   furosemide 40 MG tablet Commonly known as: LASIX TAKE 2 TABLETS EVERY MORNING AND 1 TABLET LATE IN THE AFTERNOON What changed:  how much to take how to take this when to take this additional instructions   guaiFENesin 600 MG 12 hr tablet Commonly known as: MUCINEX Take 600 mg by mouth 2 (two) times daily.   ipratropium-albuterol 0.5-2.5 (3) MG/3ML Soln Commonly known as: DUONEB Take 3 mLs by nebulization every 6 (six) hours as needed. What changed: when to take this   multivitamins ther. w/minerals Tabs tablet Take 1 tablet by mouth daily.   nitroGLYCERIN 0.4 MG SL tablet Commonly known as: NITROSTAT Place 1 tablet (0.4 mg total) under the tongue every 5 (five) minutes x 3 doses as needed for chest pain.   onetouch ultrasoft lancets Check blood sugars no more than twice daily   OneTouch Verio test strip Generic drug: glucose blood USE TO CHECK BLOOD SUGAR NO MORE THAN TWICE A DAY   pantoprazole 40 MG tablet Commonly known as: PROTONIX Take 1 tablet (40 mg total) by mouth daily before breakfast.   tamsulosin 0.4 MG Caps capsule Commonly known as: FLOMAX Take 1 capsule (0.4 mg total) by mouth daily  after supper.        Follow-up Information     Bradley Sorrow, NP Follow up.   Specialty: Cardiology Why: Cardiology follow-up appointment has been scheduled for you at [facility] [place] location on Tuesday Oct 19, 2022 2:20 PM (Arrive by 2:05 PM). Bradley [name] is one of our nurse practitioners that works with Dr. Duke Salvia. Contact information: [address] [place] [id number] [phone]         Sheilah Pigeon, PA-C Follow up.   Specialty: Cardiology Why: [facility] - [address] location - follow up with electrophysiology team PA on Thursday Oct 14, 2022 at 10:55 AM. Wilmon Arms 15 minutes prior to appointment to check in. Contact information: [address] [place] [address] [phone]                Allergies  Allergen Reactions   Hydrocodone Other (See Comments)    "given to him in the hospital; went thru withdrawals once home; dr said not to take it again" (09/27/2012) dizziness   Ultram [Tramadol] Other (See Comments)    "given to him in the hospital; went thru withdrawals once home; dr said not to take it again" (09/27/2012) dizziness  Cialis [Tadalafil] Other (See Comments)    Headache Backache  Dizziness    Avelox [Moxifloxacin Hydrochloride] Itching and Other (See Comments)    Headache Dizziness    Consultations: Cardiology   Procedures/Studies: CARDIAC CATHETERIZATION  Addendum Date: 10/05/2022     The left ventricular systolic function is normal.   LV end diastolic pressure is normal.   The left ventricular ejection fraction is 50-55% by visual estimate.   Hemodynamic findings consistent with mild pulmonary hypertension.   There is no aortic valve stenosis.   Minimal, nonobstructive coronary artery disease.   Ao sat 99%, PA sat 68%, mean RA pressure 8 mmHg, PA pressure 43/17, mean PA pressure 29 mmHg, pulmonary capillary wedge pressure 13/15, mean pulmonary capillary wedge pressure 12 mmHg, cardiac  output 4.60 L/min, cardiac index 2.39.   Tortuous right radial artery with radial loop.  This was straightened with an interventional wire and the patient tolerated this well.  We were able to complete the procedure from the right radial approach.  If cath was needed in the future, would consider alternative access. Continue preventive therapy from cardiac standpoint.  Plan is for him to go back to [facility] for further pulmonary management.  Result Date: 10/05/2022   The left ventricular systolic function is normal.   LV end diastolic pressure is normal.   The left ventricular ejection fraction is 50-55% by visual estimate.   Hemodynamic findings consistent with mild pulmonary hypertension.   There is no aortic valve stenosis.   Minimal, nonobstructive coronary artery disease.   Ao sat 99%, PA sat 68%, mean RA pressure 8 mmHg, PA pressure 43/17, mean PA pressure 29 mmHg, pulmonary capillary wedge pressure 13/15, mean pulmonary capillary wedge pressure 12 mmHg, cardiac output 4.60 L/min, cardiac index 2.39. Continue preventive therapy from cardiac standpoint.  Plan is for him to go back to [facility] for further pulmonary management.   ECHOCARDIOGRAM COMPLETE  Result Date: 10/03/2022    ECHOCARDIOGRAM REPORT   Patient Name:   Bradley STARACE [name]. Date of Exam: 10/03/2022 Medical Rec #:  [id number]         Height:       69.0 in Accession #:    [id number]        Weight:       171.5 lb Date of Birth:  [birth year]          BSA:          1.935 m Patient Age:    81 years          BP:           135/86 mmHg Patient Gender: M                 HR:           72 bpm. Exam Location:  Inpatient Procedure: 2D Echo Indications:    CHF  History:        Patient has prior history of Echocardiogram examinations, most                 recent 07/21/2022. Cardiomyopathy, CAD, Pacemaker, COPD,                 Arrythmias:Atrial Fibrillation; Risk Factors:Diabetes and                 Hypertension.  Sonographer:    Cathie Hoops Referring Phys: [phone] Wendall Stade  Sonographer Comments: Technically difficult study due to poor echo windows. Image  acquisition challenging due to COPD. IMPRESSIONS  1. Left ventricular ejection fraction, by estimation, is 55 to 60%. The left ventricle has normal function. Left ventricular endocardial border not optimally defined to evaluate regional wall motion. Left ventricular diastolic parameters are indeterminate.  2. Right ventricular systolic function is mildly reduced. The right ventricular size is normal. There is moderately elevated pulmonary artery systolic pressure. The estimated right ventricular systolic pressure is 50.8 mmHg.  3. Left atrial size was mildly dilated.  4. Right atrial size was mildly dilated.  5. The mitral valve is degenerative. Trivial mitral valve regurgitation. No evidence of mitral stenosis.  6. The aortic valve is tricuspid. There is mild calcification of the aortic valve. There is mild thickening of the aortic valve. Aortic valve regurgitation is not visualized. No aortic stenosis is present.  7. The inferior vena cava is normal in size with <50% respiratory variability, suggesting right atrial pressure of 8 mmHg. Conclusion(s)/Recommendation(s): Consider limited echocardiogram with Definity contrast to better assess regional wall motion if clinically relevant. FINDINGS  Left Ventricle: Left ventricular ejection fraction, by estimation, is 55 to 60%. The left ventricle has normal function. Left ventricular endocardial border not optimally defined to evaluate regional wall motion. The left ventricular internal cavity size was normal in size. There is no left ventricular hypertrophy. Left ventricular diastolic parameters are indeterminate. Right Ventricle: The right ventricular size is normal. No increase in right ventricular wall thickness. Right ventricular systolic function is mildly reduced. There is moderately elevated pulmonary artery systolic pressure. The  tricuspid regurgitant velocity is 3.27 m/s, and with an assumed right atrial pressure of 8 mmHg, the estimated right ventricular systolic pressure is 50.8 mmHg. Left Atrium: Left atrial size was mildly dilated. Right Atrium: Right atrial size was mildly dilated. Pericardium: There is no evidence of pericardial effusion. Mitral Valve: The mitral valve is degenerative in appearance. Mild mitral annular calcification. Trivial mitral valve regurgitation. No evidence of mitral valve stenosis. Tricuspid Valve: The tricuspid valve is normal in structure. Tricuspid valve regurgitation is mild . No evidence of tricuspid stenosis. Aortic Valve: The aortic valve is tricuspid. There is mild calcification of the aortic valve. There is mild thickening of the aortic valve. Aortic valve regurgitation is not visualized. No aortic stenosis is present. Aortic valve mean gradient measures 4.0 mmHg. Aortic valve peak gradient measures 7.7 mmHg. Aortic valve area, by VTI measures 1.91 cm. Pulmonic Valve: The pulmonic valve was normal in structure. Pulmonic valve regurgitation is trivial. No evidence of pulmonic stenosis. Aorta: The aortic root is normal in size and structure. Venous: The inferior vena cava is normal in size with less than 50% respiratory variability, suggesting right atrial pressure of 8 mmHg. IAS/Shunts: The atrial septum is grossly normal.  LEFT VENTRICLE PLAX 2D LVIDd:         4.70 cm     Diastology LVIDs:         3.20 cm     LV e' medial:    11.10 cm/s LV PW:         0.80 cm     LV E/e' medial:  8.6 LV IVS:        0.80 cm     LV e' lateral:   10.70 cm/s LVOT diam:     2.20 cm     LV E/e' lateral: 8.9 LV SV:         45 LV SV Index:   23 LVOT Area:     3.80 cm  LV Volumes (  MOD) LV vol d, MOD A2C: 74.9 ml LV vol d, MOD A4C: 63.6 ml LV vol s, MOD A2C: 25.6 ml LV vol s, MOD A4C: 24.2 ml LV SV MOD A2C:     49.3 ml LV SV MOD A4C:     63.6 ml LV SV MOD BP:      42.9 ml RIGHT VENTRICLE RV Basal diam:  4.00 cm RV Mid diam:     3.40 cm RV S prime:     12.60 cm/s TAPSE (M-mode): 2.4 cm LEFT ATRIUM             Index        RIGHT ATRIUM           Index LA diam:        4.20 cm 2.17 cm/m   RA Area:     22.10 cm LA Vol (A2C):   81.1 ml 41.90 ml/m  RA Volume:   56.50 ml  29.19 ml/m LA Vol (A4C):   75.4 ml 38.96 ml/m LA Biplane Vol: 82.1 ml 42.42 ml/m  AORTIC VALVE                    PULMONIC VALVE AV Area (Vmax):    1.80 cm     PV Vmax:          0.87 m/s AV Area (Vmean):   1.83 cm     PV Peak grad:     3.0 mmHg AV Area (VTI):     1.91 cm     PR End Diast Vel: 7.62 msec AV Vmax:           139.00 cm/s AV Vmean:          98.300 cm/s AV VTI:            0.237 m AV Peak Grad:      7.7 mmHg AV Mean Grad:      4.0 mmHg LVOT Vmax:         65.90 cm/s LVOT Vmean:        47.300 cm/s LVOT VTI:          0.119 m LVOT/AV VTI ratio: 0.50  AORTA Ao Root diam: 3.30 cm Ao Asc diam:  3.50 cm MITRAL VALVE               TRICUSPID VALVE MV Area (PHT): 4.39 cm    TR Peak grad:   42.8 mmHg MV Decel Time: 173 msec    TR Vmax:        327.00 cm/s MR Peak grad: 72.1 mmHg MR Vmax:      424.50 cm/s  SHUNTS MV E velocity: 95.10 cm/s  Systemic VTI:  0.12 m MV A velocity: 26.60 cm/s  Systemic Diam: 2.20 cm MV E/A ratio:  3.58 Weston Brass MD Electronically signed by Weston Brass MD Signature Date/Time: 10/03/2022/11:55:23 AM    Final    CT Angio Chest Pulmonary Embolism (PE) W or WO Contrast  Result Date: 10/02/2022 CLINICAL DATA:  Short of breath. 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 EXAM: CT ANGIOGRAPHY CHEST WITH CONTRAST TECHNIQUE: Multidetector CT imaging of the chest was performed using the standard protocol during bolus administration of intravenous contrast. Multiplanar CT image reconstructions and MIPs were obtained to evaluate the vascular anatomy. RADIATION DOSE REDUCTION: This exam was performed according to the departmental dose-optimization program which includes automated exposure control, adjustment of the mA and/or kV according to  patient size and/or use of iterative reconstruction technique. CONTRAST:  OMNIPAQUE IOHEXOL 350 MG/ML SOLN COMPARISON:  07/30/2021. FINDINGS: Cardiovascular: Suboptimal opacification of the pulmonary arteries limits assessment of the segmental and smaller vessels for the presence of pulmonary emboli. Allowing for this limitation, there is no evidence of a large or central pulmonary embolism. Heart is mildly enlarged. Three-vessel coronary artery calcifications. No pericardial effusion. Great vessels are normal in caliber. No aortic dissection. Mild aortic atherosclerosis. Arch branch vessels are widely patent. Mediastinum/Nodes: No neck base, mediastinal or hilar masses. Mildly enlarged right subcarinal lymph node, 1.4 cm in short axis, less prominent than on the prior CT. Trachea esophagus are unremarkable. Lungs/Pleura: Patchy opacity noted at the base of the right lower lobe. Mild bilateral interstitial thickening most evident in the lower lungs. Mild dependent atelectasis or scarring in the left upper lobe lingula and in the anterior inferior right upper lobe. Mild mosaic perfusion bilaterally suggest small airways disease/air trapping. No pleural effusion or pneumothorax. Upper Abdomen: No acute findings. Musculoskeletal: No fracture or acute finding. No bone lesion. No chest wall mass. Review of the MIP images confirms the above findings. IMPRESSION: 1. Suboptimal study. No evidence of a large or central pulmonary embolism. 2. Patchy opacity at the base of the right lower lobe may be due to atelectasis or pneumonia. 3. Mosaic perfusion in the lungs is consistent small airways disease and air trapping, similar to the appearance on the prior chest CT. 4. No evidence of pulmonary edema. 5. Cardiomegaly and three-vessel coronary artery calcifications. Mild aortic atherosclerosis. Aortic Atherosclerosis (ICD10-I70.0). Electronically Signed   By: Amie Portland M.D.   On: 10/02/2022 12:49   DG Chest 2  View  Result Date: 10/01/2022 CLINICAL DATA:  Shortness of breath, cough EXAM: CHEST - 2 VIEW COMPARISON:  08/30/2022 FINDINGS: Cardiomegaly with left chest multi lead pacer. Diffuse bilateral interstitial opacity, similar to prior examinations. Disc degenerative disease of the thoracic spine. IMPRESSION: Cardiomegaly with diffuse bilateral interstitial opacity, similar to prior examinations, likely edema. No new or focal airspace opacity. Electronically Signed   By: Jearld Lesch M.D.   On: 10/01/2022 13:49   CUP PACEART REMOTE DEVICE CHECK  Result Date: 09/30/2022 Scheduled remote reviewed. Normal device function.  The device reached the elective replacement indicator on 08/28/2022, sent to triage There were 3 ventricular arrhythmias detected, 2 were short NSVT and one was greater than 20 beats and fell into the VT monitor only zone, sent to triage. No next remote scheduled. Hassell Halim, RN, CCDS, [employer]  (Echo, Carotid, EGD, Colonoscopy, ERCP)    Subjective: No complaints  Discharge Exam: Vitals:   10/07/22 0438 10/07/22 0924  BP: (!) 149/99   Pulse: 75   Resp: 18   Temp: 97.7 F (36.5 C)   SpO2: 100% 99%   Vitals:   10/06/22 2039 10/06/22 2100 10/07/22 0438 10/07/22 0924  BP:  129/82 (!) 149/99   Pulse:  73 75   Resp:   18   Temp:  97.7 F (36.5 C) 97.7 F (36.5 C)   TempSrc:  Oral Oral   SpO2: 98% 98% 100% 99%  Weight:      Height:        General: Pt is alert, awake, not in acute distress Cardiovascular: RRR, S1/S2 +, no rubs, no gallops Respiratory: CTA bilaterally, no wheezing, no rhonchi Abdominal: Soft, NT, ND, bowel sounds + Extremities: no edema, no cyanosis    The results of significant diagnostics from this hospitalization (including imaging, microbiology, ancillary and laboratory) are listed below for  reference.     Microbiology: Recent Results (from the past 240 hour(s))  Expectorated Sputum Assessment w Gram Stain, Rflx to Resp Cult      Status: None   Collection Time: 10/01/22  5:44 AM   Specimen: Sputum  Result Value Ref Range Status   Specimen Description SPUTUM  Final   Special Requests NONE  Final   Sputum evaluation   Final    THIS SPECIMEN IS ACCEPTABLE FOR SPUTUM CULTURE Performed at [facility], [address], [place], [place] [address]    Report Status 10/02/2022 FINAL  Final  Culture, Respiratory w Gram Stain     Status: None   Collection Time: 10/01/22  5:44 AM   Specimen: SPU  Result Value Ref Range Status   Specimen Description   Final    SPUTUM Performed at [facility], [address] [place], [place] [address]    Special Requests   Final    NONE Reflexed from [id number] Performed at [facility], [address], [place], [place] [address]    Gram Stain   Final    MODERATE GRAM POSITIVE COCCI IN PAIRS IN CHAINS FEW GRAM NEGATIVE RODS RARE YEAST FEW SQUAMOUS EPITHELIAL CELLS PRESENT FEW WBC PRESENT, PREDOMINANTLY PMN    Culture   Final    ABUNDANT Normal respiratory flora-no Staph aureus or Pseudomonas seen Performed at [facility], [address] [place], [place] [address]    Report Status 10/06/2022 FINAL  Final  Resp panel by RT-PCR (RSV, Flu A&B, Covid) Anterior Nasal Swab     Status: Abnormal   Collection Time: 10/01/22 12:46 PM   Specimen: Anterior Nasal Swab  Result Value Ref Range Status   SARS Coronavirus 2 by RT PCR POSITIVE (A) NEGATIVE Final    Comment: (NOTE) SARS-CoV-2 target nucleic acids are DETECTED.  The SARS-CoV-2 RNA is generally detectable in upper respiratory specimens during the acute phase of infection. Positive results are indicative of the presence of the identified virus, but do not rule out bacterial infection or co-infection with other pathogens not detected by the test. Clinical correlation with patient history and other diagnostic information is necessary to determine patient infection  status. The expected result is Negative.  Fact Sheet for Patients: BloggerCourse.com  Fact Sheet for Healthcare Providers: SeriousBroker.it  This test is not yet approved or cleared by the [place] FDA and  has been authorized for detection and/or diagnosis of SARS-CoV-2 by FDA under an Emergency Use Authorization (EUA).  This EUA will remain in effect (meaning this test can be used) for the duration of  the COVID-19 declaration under Section 564(b)(1) of the A ct, 21 U.S.C. section 360bbb-3(b)(1), unless the authorization is terminated or revoked sooner.     Influenza A by PCR NEGATIVE NEGATIVE Final   Influenza B by PCR NEGATIVE NEGATIVE Final    Comment: (NOTE) The Xpert Xpress SARS-CoV-2/FLU/RSV plus assay is intended as an aid in the diagnosis of influenza from Nasopharyngeal swab specimens and should not be used as a sole basis for treatment. Nasal washings and aspirates are unacceptable for Xpert Xpress SARS-CoV-2/FLU/RSV testing.  Fact Sheet for Patients: BloggerCourse.com  Fact Sheet for Healthcare Providers: SeriousBroker.it  This test is not yet approved or cleared by the [place] FDA and has been authorized for detection and/or diagnosis of SARS-CoV-2 by FDA under an Emergency Use Authorization (EUA). This EUA will remain in effect (meaning this test can be used) for the duration  of the COVID-19 declaration under Section 564(b)(1) of the Act, 21 U.S.C. section 360bbb-3(b)(1), unless the authorization is terminated or revoked.     Resp Syncytial Virus by PCR NEGATIVE NEGATIVE Final    Comment: (NOTE) Fact Sheet for Patients: BloggerCourse.com  Fact Sheet for Healthcare Providers: SeriousBroker.it  This test is not yet approved or cleared by the [place] FDA and has been authorized for detection  and/or diagnosis of SARS-CoV-2 by FDA under an Emergency Use Authorization (EUA). This EUA will remain in effect (meaning this test can be used) for the duration of the COVID-19 declaration under Section 564(b)(1) of the Act, 21 U.S.C. section 360bbb-3(b)(1), unless the authorization is terminated or revoked.  Performed at [facility], [address], [place], [place] [address]   Culture, blood (Routine X 2) w Reflex to ID Panel     Status: None   Collection Time: 10/01/22  9:12 PM   Specimen: BLOOD  Result Value Ref Range Status   Specimen Description   Final    BLOOD BLOOD RIGHT WRIST Performed at [facility], [address], [place], [place] [address]    Special Requests   Final    BOTTLES DRAWN AEROBIC AND ANAEROBIC Blood Culture results may not be optimal due to an inadequate volume of blood received in culture bottles Performed at [facility], [address], [place], [place] [address]    Culture   Final    NO GROWTH 5 DAYS Performed at [facility], [address], [place], [place] [address]    Report Status 10/06/2022 FINAL  Final  Culture, blood (Routine X 2) w Reflex to ID Panel     Status: None   Collection Time: 10/01/22  9:59 PM   Specimen: BLOOD  Result Value Ref Range Status   Specimen Description   Final    BLOOD BLOOD LEFT HAND Performed at [facility], [address] [place], [place] [address]    Special Requests   Final    BOTTLES DRAWN AEROBIC AND ANAEROBIC Blood Culture adequate volume Performed at [facility], [address], [place], [place] [address]    Culture   Final    NO GROWTH 5 DAYS Performed at [facility], [address], [place], [place] [address]    Report Status 10/06/2022 FINAL  Final  MRSA Next Gen by PCR, Nasal     Status: None   Collection Time: 10/01/22 10:40 PM   Specimen: Nasal Mucosa; Nasal Swab  Result Value Ref  Range Status   MRSA by PCR Next Gen NOT DETECTED NOT DETECTED Final    Comment: (NOTE) The GeneXpert MRSA Assay (FDA approved for NASAL specimens only), is one component of a comprehensive MRSA colonization surveillance program. It is not intended to diagnose MRSA infection nor to guide or monitor treatment for MRSA infections. Test performance is not FDA approved in patients less than 59 years old. Performed at [facility], [address] [place], [place] [address]      Labs: BNP (last 3 results) Recent Labs    08/23/22 0530 08/27/22 0515 10/01/22 1255  BNP 506.4* 283.3* 188.5*   Basic Metabolic Panel: Recent Labs  Lab 10/02/22 0542 10/04/22 0448 10/05/22 0420 10/05/22 1502 10/05/22 1506 10/06/22 1354 10/07/22 0506  NA 141 138 138 136 137  137 137 136  K 4.2 3.7 3.9 4.5 4.6  4.5 4.1 4.2  CL 103 104 104  --   --  102 99  CO2 27 30 28   --   --  30 30  GLUCOSE 164* 128* 105*  --   --  134* 122*  BUN 19 22 16   --   --  18 15  CREATININE 0.85 0.72 0.55*  --   --  0.75 0.52*  CALCIUM 8.9 8.4* 8.7*  --   --  8.8* 8.6*  MG  --  2.5*  --   --   --   --   --   PHOS  --  2.6  --   --   --   --   --    Liver Function Tests: Recent Labs  Lab 10/01/22 1428 10/02/22 0542 10/06/22 1354  AST 37 24 18  ALT 15 17 23   ALKPHOS 75 64 59  BILITOT 1.4* 1.0 0.9  PROT 7.2 6.9 6.5  ALBUMIN 3.8 3.7 3.5   No results for input(s): "LIPASE", "AMYLASE" in the last 168 hours. No results for input(s): "AMMONIA" in the last 168 hours. CBC: Recent Labs  Lab 10/01/22 1255 10/02/22 0542 10/03/22 0450 10/04/22 0448 10/05/22 0420 10/05/22 1502 10/05/22 1506 10/07/22 0506  WBC 9.8 9.9 7.2 9.1 6.9  --   --  9.8  NEUTROABS 7.5  --   --   --   --   --   --   --   HGB 14.2 13.3 12.2* 12.5* 13.2 13.3 13.3  13.3 13.9  HCT 43.4 39.4 36.3* 38.5* 39.6 39.0 39.0  39.0 41.3  MCV 96.4 94.9 94.8 97.0 93.6  --   --  94.7  PLT 198 223 206 210 225  --   --  246   Cardiac  Enzymes: No results for input(s): "CKTOTAL", "CKMB", "CKMBINDEX", "TROPONINI" in the last 168 hours. BNP: Invalid input(s): "POCBNP" CBG: Recent Labs  Lab 10/06/22 0736 10/06/22 1143 10/06/22 1650 10/06/22 2039 10/07/22 0746  GLUCAP 98 160* 236* 193* 116*   D-Dimer No results for input(s): "DDIMER" in the last 72 hours. Hgb A1c No results for input(s): "HGBA1C" in the last 72 hours. Lipid Profile Recent Labs    10/07/22 0506  CHOL 180  HDL 58  LDLCALC 109*  TRIG 67  CHOLHDL 3.1   Thyroid function studies No results for input(s): "TSH", "T4TOTAL", "T3FREE", "THYROIDAB" in the last 72 hours.  Invalid input(s): "FREET3" Anemia work up No results for input(s): "VITAMINB12", "FOLATE", "FERRITIN", "TIBC", "IRON", "RETICCTPCT" in the last 72 hours. Urinalysis    Component Value Date/Time   COLORURINE YELLOW 10/01/2022 2021   APPEARANCEUR CLEAR 10/01/2022 2021   LABSPEC 1.016 10/01/2022 2021   PHURINE 5.0 10/01/2022 2021   GLUCOSEU >=500 (A) 10/01/2022 2021   GLUCOSEU NEGATIVE 07/15/2015 1440   HGBUR NEGATIVE 10/01/2022 2021   BILIRUBINUR NEGATIVE 10/01/2022 2021   KETONESUR NEGATIVE 10/01/2022 2021   PROTEINUR NEGATIVE 10/01/2022 2021   UROBILINOGEN 1.0 07/15/2015 1440   NITRITE NEGATIVE 10/01/2022 2021   LEUKOCYTESUR NEGATIVE 10/01/2022 2021   Sepsis Labs Recent Labs  Lab 10/03/22 0450 10/04/22 0448 10/05/22 0420 10/07/22 0506  WBC 7.2 9.1 6.9 9.8   Microbiology Recent Results (from the past 240 hour(s))  Expectorated Sputum Assessment w Gram Stain, Rflx to Resp Cult     Status: None   Collection Time: 10/01/22  5:44 AM   Specimen: Sputum  Result Value Ref Range Status   Specimen Description SPUTUM  Final   Special Requests NONE  Final   Sputum evaluation   Final    THIS SPECIMEN  IS ACCEPTABLE FOR SPUTUM CULTURE Performed at [facility], [address], [place], [place] [address]    Report Status 10/02/2022 FINAL  Final  Culture,  Respiratory w Gram Stain     Status: None   Collection Time: 10/01/22  5:44 AM   Specimen: SPU  Result Value Ref Range Status   Specimen Description   Final    SPUTUM Performed at [facility], [address] [place], [place] [address]    Special Requests   Final    NONE Reflexed from [id number] Performed at [facility], [address], [place], [place] [address]    Gram Stain   Final    MODERATE GRAM POSITIVE COCCI IN PAIRS IN CHAINS FEW GRAM NEGATIVE RODS RARE YEAST FEW SQUAMOUS EPITHELIAL CELLS PRESENT FEW WBC PRESENT, PREDOMINANTLY PMN    Culture   Final    ABUNDANT Normal respiratory flora-no Staph aureus or Pseudomonas seen Performed at [facility], [address], [place], [place] [address]    Report Status 10/06/2022 FINAL  Final  Resp panel by RT-PCR (RSV, Flu A&B, Covid) Anterior Nasal Swab     Status: Abnormal   Collection Time: 10/01/22 12:46 PM   Specimen: Anterior Nasal Swab  Result Value Ref Range Status   SARS Coronavirus 2 by RT PCR POSITIVE (A) NEGATIVE Final    Comment: (NOTE) SARS-CoV-2 target nucleic acids are DETECTED.  The SARS-CoV-2 RNA is generally detectable in upper respiratory specimens during the acute phase of infection. Positive results are indicative of the presence of the identified virus, but do not rule out bacterial infection or co-infection with other pathogens not detected by the test. Clinical correlation with patient history and other diagnostic information is necessary to determine patient infection status. The expected result is Negative.  Fact Sheet for Patients: BloggerCourse.com  Fact Sheet for Healthcare Providers: SeriousBroker.it  This test is not yet approved or cleared by the [place] FDA and  has been authorized for detection and/or diagnosis of SARS-CoV-2 by FDA under an Emergency Use Authorization (EUA).  This EUA  will remain in effect (meaning this test can be used) for the duration of  the COVID-19 declaration under Section 564(b)(1) of the A ct, 21 U.S.C. section 360bbb-3(b)(1), unless the authorization is terminated or revoked sooner.     Influenza A by PCR NEGATIVE NEGATIVE Final   Influenza B by PCR NEGATIVE NEGATIVE Final    Comment: (NOTE) The Xpert Xpress SARS-CoV-2/FLU/RSV plus assay is intended as an aid in the diagnosis of influenza from Nasopharyngeal swab specimens and should not be used as a sole basis for treatment. Nasal washings and aspirates are unacceptable for Xpert Xpress SARS-CoV-2/FLU/RSV testing.  Fact Sheet for Patients: BloggerCourse.com  Fact Sheet for Healthcare Providers: SeriousBroker.it  This test is not yet approved or cleared by the [place] FDA and has been authorized for detection and/or diagnosis of SARS-CoV-2 by FDA under an Emergency Use Authorization (EUA). This EUA will remain in effect (meaning this test can be used) for the duration of the COVID-19 declaration under Section 564(b)(1) of the Act, 21 U.S.C. section 360bbb-3(b)(1), unless the authorization is terminated or revoked.     Resp Syncytial Virus by PCR NEGATIVE NEGATIVE Final    Comment: (NOTE) Fact Sheet for Patients: BloggerCourse.com  Fact Sheet for Healthcare Providers: SeriousBroker.it  This test is not yet approved or cleared by the [place] FDA and has been authorized for detection and/or diagnosis of SARS-CoV-2  by FDA under an Emergency Use Authorization (EUA). This EUA will remain in effect (meaning this test can be used) for the duration of the COVID-19 declaration under Section 564(b)(1) of the Act, 21 U.S.C. section 360bbb-3(b)(1), unless the authorization is terminated or revoked.  Performed at [facility], [address], [place],  [place] [address]   Culture, blood (Routine X 2) w Reflex to ID Panel     Status: None   Collection Time: 10/01/22  9:12 PM   Specimen: BLOOD  Result Value Ref Range Status   Specimen Description   Final    BLOOD BLOOD RIGHT WRIST Performed at [facility], [address], [place], [place] [address]    Special Requests   Final    BOTTLES DRAWN AEROBIC AND ANAEROBIC Blood Culture results may not be optimal due to an inadequate volume of blood received in culture bottles Performed at [facility], [address] [place], [place] [address]    Culture   Final    NO GROWTH 5 DAYS Performed at [facility], [address] [place], [place] [address]    Report Status 10/06/2022 FINAL  Final  Culture, blood (Routine X 2) w Reflex to ID Panel     Status: None   Collection Time: 10/01/22  9:59 PM   Specimen: BLOOD  Result Value Ref Range Status   Specimen Description   Final    BLOOD BLOOD LEFT HAND Performed at [facility], [address], [place], [place] [address]    Special Requests   Final    BOTTLES DRAWN AEROBIC AND ANAEROBIC Blood Culture adequate volume Performed at [facility], [address] [place], [place] [address]    Culture   Final    NO GROWTH 5 DAYS Performed at [facility], [address], [place], [place] [address]    Report Status 10/06/2022 FINAL  Final  MRSA Next Gen by PCR, Nasal     Status: None   Collection Time: 10/01/22 10:40 PM   Specimen: Nasal Mucosa; Nasal Swab  Result Value Ref Range Status   MRSA by PCR Next Gen NOT DETECTED NOT DETECTED Final    Comment: (NOTE) The GeneXpert MRSA Assay (FDA approved for NASAL specimens only), is one component of a comprehensive MRSA colonization surveillance program. It is not intended to diagnose MRSA infection nor to guide or monitor treatment for MRSA infections. Test performance is not FDA approved in patients less than 33  years old. Performed at [facility], [address], [place], [place] [address]     SIGNED:   Marinda Elk, MD  [facility] 10/07/2022, 11:33 AM Pager   If 7PM-7AM, please contact night-coverage [email or web address] Password [email or web address]

## 2022-10-07 NOTE — Progress Notes (Signed)
     Rounding Note    Patient Name: Bradley [name]. Date of Encounter: 10/07/2022   BP is up this am Further management per Dr. Robb Matar  Would add back his home dose of amlodipine if his BP remains elevated.   Follow up with Dr. Duke Salvia Has an appt scheduled with Gillian Shields, NP on Jan 9.  [facility] will sign off.   Medication Recommendations:  cont current meds.   Add amlodipine back if BP remains elevated  Other recommendations (labs, testing, etc):    Follow up as an outpatient:   as above        Signed, Kristeen Miss, MD  10/07/2022, 6:45 AM

## 2022-10-07 NOTE — Plan of Care (Signed)
  Problem: Education: Goal: Knowledge of General Education information will improve Description Including pain rating scale, medication(s)/side effects and non-pharmacologic comfort measures Outcome: Progressing   Problem: Health Behavior/Discharge Planning: Goal: Ability to manage health-related needs will improve Outcome: Progressing   

## 2022-10-08 ENCOUNTER — Encounter: Payer: Self-pay | Admitting: *Deleted

## 2022-10-08 ENCOUNTER — Telehealth: Payer: Self-pay | Admitting: *Deleted

## 2022-10-08 NOTE — Patient Outreach (Signed)
  Care Coordination [facility] Note Transition Care Management Unsuccessful Follow-up Telephone Call  Date of discharge and from where:  Thursday, 10/07/22 [facility]; Acute on chronic CHF; COVID-19 pneumonia  Attempts:  1st Attempt  Reason for unsuccessful TCM follow-up call:  Left voice message  Caryl Pina, RN, BSN, CCRN Alumnus RN CM Care Coordination/ Transition of Care- [facility] Care Management [phone]: direct office

## 2022-10-10 NOTE — Progress Notes (Deleted)
Cardiology Office Note Date:  10/10/2022  Patient ID:  Bradley MELNYK Sr., DOB [birth year], MRN [id number] PCP:  Colon Branch, MD  Cardiologist:  Dr. Oval Linsey Electrophysiologist: Dr. Lovena Le  ***refresh   Chief Complaint: *** annual device visit/ERI  History of Present Illness: Bradley UPTEGROVE Sr. is a 81 y.o. male with history of AFib (s/p AVnode ablation) w/PPM, COPD, p.HTN (on home O2), DM, HTN, HLD, chronic CHF (diastolic)  He saw Dr. Lovena Le 07/16/22, pacer functioning well, nearing ERI, BP was good  Nov had COPD exacerbation and COVID  He saw Dr. Oval Linsey 09/13/22 noted recurrent hospitalizations with COPD/CHF exacerbations.  Most recently his coreg changed to bisoprolol apparently at an ER visit, also noted his Delene Loll had been stopped for unclear reasons.  He was walking for exercise., doing well. Discussed perhaps restarting Entresto in the future, no changes were made.  Device clinic note 09/30/22 with some NSVT and alert that he had reached RRT  Admitted 10/01/22 with acute/chronic respitarry failure, PNA, COPD.  He had abnormal Trops and cardiology consulted, LHC with no obstructive CAD, RHC as well with findings c/w p.HTN Discharged 10/07/22.  Sees cardiology tea, 10/19/21.  *** RRT, procedure *** volume   Device information MDT dual chamber PPM implanted 09/08/2011   Past Medical History:  Diagnosis Date   Abnormal CT scan, chest 2012   CT chest, several lymphadenopathies. Sees pulmonary   Anemia    intermittent   Arthritis    "?back" (09/19/2018)   Atrial fibrillation (HCC)    s/p AV node ablation & BiV PPM implantation 09/08/11 (op dictation pending)   BPH (benign prostatic hyperplasia)    Saw Dr Reece Agar 2004, normal renal u/s   Bronchitis 08/26/2017   CAD (coronary artery disease)    CHF (congestive heart failure) ([place])    Thought primarily to be non-systolic although EF down (EF 45-50% 12/2010, down to 35-40% 09/05/11), cath 2008 with no CAD, nuclear  study 07/2011 showing Small area of reversibility in the distal ant/lat wall the left ventricle suspicious for ischemia/septal wall HK but felt to be low risk  (per D/C Summary 07/2011)   Chronic bronchitis ([place])    "get it ~ q yr"   Chronic diastolic heart failure ([place])         Chronic respiratory failure with hypoxia and hypercapnia ([place]) 09/14/2010   Followed in Pulmonary clinic/  Healthcare/ [facility]  Started on 02 2lpm at discharge 09/23/11   - PFT's 10/28/2011  FEV1  1.40 (51%)  with ratio 70 and no better p B2 and DLCO 53 corrects to 101    - PFT's 10/18/2014    FEV1 1.71 (59%) with ratio 68 and no sign change p B2 and DLCO 53 corrects to 85  -  HC03   07/28/20  = 33  -  HCO3   03/17/21     = 31     Heart murmur    HLD (hyperlipidemia)    HTN (hypertension)    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   Pleural effusion 2008   S/p decortication   Pneumonia 08/02/2011   Pulmonary HTN ([place])    per cath 2008   Type II diabetes mellitus ([place]) 1999  Past Surgical History:  Procedure Laterality Date   BI-VENTRICULAR PACEMAKER INSERTION Left 09/08/2011   Procedure: BI-VENTRICULAR PACEMAKER INSERTION (CRT-P);  Surgeon: Evans Lance, MD;  Location: [facility] C[facility] LAB;  Service: Cardiovascular;  Laterality: Left;   CARDIAC CATHETERIZATION     "couple times; never had balloon or stent" (09/19/2018)   CATARACT EXTRACTION W/ INTRAOCULAR LENS  IMPLANT, BILATERAL Bilateral 08/2018   COLONOSCOPY  03/10/11   normal   INSERT / REPLACE / REMOVE PACEMAKER  09/08/11   pacemaker placement   LUNG DECORTICATION     PLEURAL SCARIFICATION     pneumothorax with fibrothorax  ~ 2010   RIGHT/LEFT HEART CATH AND CORONARY ANGIOGRAPHY N/A 10/05/2022   Procedure: RIGHT/LEFT HEART CATH AND CORONARY ANGIOGRAPHY;  Surgeon: Jettie Booze, MD;  Location: [facility];  Service: Cardiovascular;  Laterality: N/A;   TONSILLECTOMY     "as a kid"     Current Outpatient Medications  Medication Sig Dispense Refill   acetaminophen (TYLENOL) 500 MG tablet Take 1,000 mg by mouth every 8 (eight) hours as needed for headache or moderate pain.     albuterol (VENTOLIN HFA) 108 (90 Base) MCG/ACT inhaler Inhale 2 puffs into the lungs every 6 (six) hours as needed for wheezing or shortness of breath. (Patient taking differently: Inhale 1 puff into the lungs 2 (two) times daily.) 8.5 g 0   amLODipine (NORVASC) 10 MG tablet Take 1 tablet (10 mg total) by mouth daily. (Patient taking differently: Take 10 mg by mouth in the morning.) 90 tablet 1   aspirin 81 MG tablet Take 81 mg by mouth every morning.      atorvastatin (LIPITOR) 20 MG tablet Take 1 tablet (20 mg total) by mouth at bedtime. (Patient taking differently: Take 20 mg by mouth every evening.) 90 tablet 1   bisoprolol (ZEBETA) 10 MG tablet Take 1 tablet (10 mg total) by mouth daily. 90 tablet 0   budesonide (PULMICORT) 0.5 MG/2ML nebulizer solution Take 2 mLs (0.5 mg total) by nebulization 2 (two) times daily. (Patient not taking: Reported on 10/02/2022) 360 mL 1   calcium-vitamin D (OSCAL WITH D) 500-200 MG-UNIT tablet Take 1 tablet by mouth every morning.     cholecalciferol (VITAMIN D3) 25 MCG (1000 UNIT) tablet Take 1,000 Units by mouth every evening.     diphenhydramine-acetaminophen (TYLENOL PM) 25-500 MG TABS tablet Take 1 tablet by mouth at bedtime as needed (sleep).     FARXIGA 10 MG TABS tablet TAKE 1 TABLET DAILY BEFORE BREAKFAST (Patient taking differently: Take 10 mg by mouth in the morning.) 90 tablet 3   furosemide (LASIX) 40 MG tablet TAKE 2 TABLETS EVERY MORNING AND 1 TABLET LATE IN THE AFTERNOON (Patient taking differently: Take 40 mg by mouth in the morning, at noon, and at bedtime.) 270 tablet 3   guaiFENesin (MUCINEX) 600 MG 12 hr tablet Take 600 mg by  mouth 2 (two) times daily.     ipratropium-albuterol (DUONEB) 0.5-2.5 (3) MG/3ML SOLN Take 3 mLs by nebulization every 6 (six) hours as needed. (Patient taking differently: Take 3 mLs by nebulization in the morning and at bedtime.) 360 mL 1   Lancets (ONETOUCH ULTRASOFT) lancets Check blood sugars no more than twice daily 200 each 12   Multiple Vitamins-Minerals (MULTIVITAMINS THER. W/MINERALS) TABS tablet Take 1 tablet by mouth daily. (Patient not taking: Reported on 10/02/2022) 30 each 5   nitroGLYCERIN (NITROSTAT) 0.4 MG SL tablet Place 1 tablet (0.4 mg total) under the tongue  every 5 (five) minutes x 3 doses as needed for chest pain. 25 tablet 3   ONETOUCH VERIO test strip USE TO CHECK BLOOD SUGAR NO MORE THAN TWICE A DAY 200 strip 12   pantoprazole (PROTONIX) 40 MG tablet Take 1 tablet (40 mg total) by mouth daily before breakfast. 90 tablet 0   tamsulosin (FLOMAX) 0.4 MG CAPS capsule Take 1 capsule (0.4 mg total) by mouth daily after supper. 90 capsule 1   No current facility-administered medications for this visit.    Allergies:   Hydrocodone, Ultram [tramadol], Cialis [tadalafil], and Avelox [moxifloxacin hydrochloride]   Social History:  The patient  reports that he quit smoking about 42 years ago. His smoking use included cigarettes and cigars. He has a 62.50 pack-year smoking history. He has never used smokeless tobacco. He reports that he does not currently use alcohol. He reports that he does not use drugs.   Family History:  The patient's family history includes Breast cancer in his maternal aunt; Coronary artery disease in his father; Diabetes in his father; Drug abuse in his son; Polycythemia in his mother.  ROS:  Please see the history of present illness.    All other systems are reviewed and otherwise negative.   PHYSICAL EXAM:  VS:  There were no vitals taken for this visit. BMI: There is no height or weight on file to calculate BMI. Well nourished, well developed, in no  acute distress HEENT: normocephalic, atraumatic Neck: no JVD, carotid bruits or masses Cardiac:  *** RRR; no significant murmurs, no rubs, or gallops Lungs:  *** CTA b/l, no wheezing, rhonchi or rales Abd: soft, nontender MS: no deformity or *** atrophy Ext: *** no edema Skin: warm and dry, no rash Neuro:  No gross deficits appreciated Psych: euthymic mood, full affect  *** PPM site is stable, no tethering or discomfort   EKG:  not done today  Device interrogation done today and reviewed by myself:  ***  10/05/22: R/LHC   The left ventricular systolic function is normal.   LV end diastolic pressure is normal.   The left ventricular ejection fraction is 50-55% by visual estimate.   Hemodynamic findings consistent with mild pulmonary hypertension.   There is no aortic valve stenosis.   Minimal, nonobstructive coronary artery disease.   Ao sat 99%, PA sat 68%, mean RA pressure 8 mmHg, PA pressure 43/17, mean PA pressure 29 mmHg, pulmonary capillary wedge pressure 13/15, mean pulmonary capillary wedge pressure 12 mmHg, cardiac output 4.60 L/min, cardiac index 2.39.   Tortuous right radial artery with radial loop.  This was straightened with an interventional wire and the patient tolerated this well.  We were able to complete the procedure from the right radial approach.  If cath was needed in the future, would consider alternative access.   Continue preventive therapy from cardiac standpoint.  Plan is for him to go back to [facility] for further pulmonary management.   10/03/22: TTE 1. Left ventricular ejection fraction, by estimation, is 55 to 60%. The  left ventricle has normal function. Left ventricular endocardial border  not optimally defined to evaluate regional wall motion. Left ventricular  diastolic parameters are  indeterminate.   2. Right ventricular systolic function is mildly reduced. The right  ventricular size is normal. There is moderately elevated  pulmonary artery  systolic pressure. The estimated right ventricular systolic pressure is  [id number] mmHg.   3. Left atrial size was mildly dilated.   4. Right atrial size  was mildly dilated.   5. The mitral valve is degenerative. Trivial mitral valve regurgitation.  No evidence of mitral stenosis.   6. The aortic valve is tricuspid. There is mild calcification of the  aortic valve. There is mild thickening of the aortic valve. Aortic valve  regurgitation is not visualized. No aortic stenosis is present.   7. The inferior vena cava is normal in size with <50% respiratory  variability, suggesting right atrial pressure of 8 mmHg.   Conclusion(s)/Recommendation(s): Consider limited echocardiogram with  Definity contrast to better assess regional wall motion if clinically  relevant.     Recent Labs: 10/01/2022: B Natriuretic Peptide 188.5; TSH 0.285 10/04/2022: Magnesium 2.5 10/06/2022: ALT 23 10/07/2022: BUN 15; Creatinine, Ser 0.52; Hemoglobin 13.9; Platelets 246; Potassium 4.2; Sodium 136  10/07/2022: Cholesterol 180; HDL 58; LDL Cholesterol 109; Total CHOL/HDL Ratio 3.1; Triglycerides 67; VLDL 13   Estimated Creatinine Clearance: 72.4 mL/min (A) (by C-G formula based on SCr of 0.52 mg/dL (L)).   Wt Readings from Last 3 Encounters:  10/06/22 176 lb 9.4 oz (80.1 kg)  09/13/22 180 lb 4.8 oz (81.8 kg)  09/09/22 164 lb 12.8 oz (74.8 kg)     Other studies reviewed: Additional studies/records reviewed today include: summarized above  ASSESSMENT AND PLAN:  PPM RRT ***   Permanent AFib S/p AV node ablation Not on a/c, it seems 2/2 prior ICH  Chronic CHF (diastolic) Chronic respiratory failure (COPD/CHF/p.HTN) *** C/w Dr. Rollen Sox  HTN ***   Disposition: F/u with ***  Current medicines are reviewed at length with the patient today.  The patient did not have any concerns regarding medicines.  Norma Fredrickson, PA-C 10/10/2022 2:33 PM     [facility] [address] [place] [address] [phone] (office)  [phone] (fax)

## 2022-10-12 NOTE — Progress Notes (Signed)
Remote pacemaker transmission.   

## 2022-10-13 ENCOUNTER — Encounter: Payer: Self-pay | Admitting: *Deleted

## 2022-10-13 ENCOUNTER — Telehealth: Payer: Self-pay | Admitting: *Deleted

## 2022-10-13 NOTE — Patient Outreach (Signed)
  Care Coordination [facility] Note Transition Care Management Unsuccessful Follow-up Telephone Call  Date of discharge and from where:  Thursday, 10/07/22 [facility]; Acute on chronic CHF; COVID pneumonia  Attempts:  2nd Attempt  Reason for unsuccessful TCM follow-up call:  Left voice message  Oneta Rack, RN, BSN, CCRN Alumnus RN CM Care Coordination/ Transition of [place] Management [phone]: direct office

## 2022-10-14 ENCOUNTER — Encounter: Payer: BLUE CROSS/BLUE SHIELD | Admitting: Physician Assistant

## 2022-10-14 ENCOUNTER — Encounter: Payer: Self-pay | Admitting: *Deleted

## 2022-10-14 ENCOUNTER — Telehealth: Payer: Self-pay | Admitting: *Deleted

## 2022-10-14 NOTE — Patient Outreach (Signed)
  Care Coordination [facility] Note Transition Care Management Follow-up Telephone Call Date of discharge and from where: Thursday, 10/07/22, [facility] Acute on chronic CHF, COVID pneumonia How have you been since you were released from the hospital? "I am doing so much better!  I am breathing fine and not having any problems at all.  They took great care of me at the hospital, please thank them all for all they did for me.  I am back to my normal self and am on my way to the doctors office now, driving myself there." Any questions or concerns? No  Items Reviewed: Did the pt receive and understand the discharge instructions provided? Yes  Medications obtained and verified? No  patient declined-- he is unable to review medications as he is on the way to his scheduled provider appointment  Other? No  Any new allergies since your discharge? No  Dietary orders reviewed? No- patient declined, on way to scheduled provider appointment Do you have support at home? Yes   Home Care and Equipment/Supplies: Were home health services ordered? no If so, what is the name of the agency? N/A  Has the agency set up a time to come to the patient's home? not applicable Were any new equipment or medical supplies ordered?  No What is the name of the medical supply agency? N/A Were you able to get the supplies/equipment? not applicable Do you have any questions related to the use of the equipment or supplies? No  Functional Questionnaire: (I = Independent and D = Dependent) ADLs: I  Bathing/Dressing- I  Meal Prep- I  Eating- I  Maintaining continence- I  Transferring/Ambulation- I  Managing Meds- I  Follow up appointments reviewed:  PCP Hospital f/u appt confirmed? No  Scheduled to see - on - @ [facility] f/u appt confirmed? Yes  Scheduled to see Cardiology provider for hospital follow up visit on Tuesday 10/19/22 @ 2:20 pm Are transportation arrangements needed? No  If their condition  worsens, is the pt aware to call PCP or go to the Emergency Dept.? Yes Was the patient provided with contact information for the PCP's office or ED? No- patient declined; confirms he already has contact information for all care providers Was to pt encouraged to call back with questions or concerns? Yes  SDOH assessments and interventions completed:   Yes SDOH Interventions Today    Flowsheet Row Most Recent Value  SDOH Interventions   Food Insecurity Interventions Intervention Not Indicated  Transportation Interventions Intervention Not Indicated  [drives self]      Care Coordination Interventions:  No Care Coordination interventions needed at this time.   Encounter Outcome:  Pt. Visit Completed    Oneta Rack, RN, BSN, CCRN Alumnus RN CM Care Coordination/ Transition of [place] Management [phone]: direct office

## 2022-10-15 ENCOUNTER — Ambulatory Visit (HOSPITAL_BASED_OUTPATIENT_CLINIC_OR_DEPARTMENT_OTHER)
Admission: RE | Admit: 2022-10-15 | Discharge: 2022-10-15 | Disposition: A | Discharge status: Home / Self Care | Payer: Medicare Other | Source: Ambulatory Visit | Attending: Nurse Practitioner | Admitting: Nurse Practitioner

## 2022-10-15 DIAGNOSIS — J849 Interstitial pulmonary disease, unspecified: Secondary | ICD-10-CM | POA: Insufficient documentation

## 2022-10-18 ENCOUNTER — Ambulatory Visit (HOSPITAL_BASED_OUTPATIENT_CLINIC_OR_DEPARTMENT_OTHER): Payer: Medicare Other

## 2022-10-19 ENCOUNTER — Encounter (HOSPITAL_BASED_OUTPATIENT_CLINIC_OR_DEPARTMENT_OTHER): Payer: Self-pay | Admitting: Family

## 2022-10-19 ENCOUNTER — Ambulatory Visit (INDEPENDENT_AMBULATORY_CARE_PROVIDER_SITE_OTHER): Payer: Medicare Other | Admitting: Family

## 2022-10-19 ENCOUNTER — Other Ambulatory Visit (HOSPITAL_BASED_OUTPATIENT_CLINIC_OR_DEPARTMENT_OTHER): Payer: Self-pay | Admitting: Family

## 2022-10-19 VITALS — BP 124/64 | HR 86 | Ht 69.0 in | Wt 184.6 lb

## 2022-10-19 DIAGNOSIS — I482 Chronic atrial fibrillation, unspecified: Secondary | ICD-10-CM

## 2022-10-19 DIAGNOSIS — I25118 Atherosclerotic heart disease of native coronary artery with other forms of angina pectoris: Secondary | ICD-10-CM

## 2022-10-19 DIAGNOSIS — E785 Hyperlipidemia, unspecified: Secondary | ICD-10-CM | POA: Diagnosis not present

## 2022-10-19 DIAGNOSIS — I5032 Chronic diastolic (congestive) heart failure: Secondary | ICD-10-CM

## 2022-10-19 DIAGNOSIS — Z950 Presence of cardiac pacemaker: Secondary | ICD-10-CM

## 2022-10-19 NOTE — Patient Instructions (Addendum)
Medication Instructions:  Continue your current medications.   *If you need a refill on your cardiac medications before your next appointment, please call your pharmacy*   Lab Work: Your physician recommends that you return for lab work today: BMP, CBC, direct LDL  If you have labs (blood work) drawn today and your tests are completely normal, you will receive your results only by: MyChart Message (if you have MyChart) OR A paper copy in the mail If you have any lab test that is abnormal or we need to change your treatment, we will call you to review the results.   Testing/Procedures: Your cardiac catheterization while in the hospital showed mildly elevated pressures in your lungs and no significant plaque build up in your heart which is great.   Follow-Up: At [facility], you and your health needs are our priority.  As part of our continuing mission to provide you with exceptional heart care, we have created designated Provider Care Teams.  These Care Teams include your primary Cardiologist (physician) and Advanced Practice Providers (APPs -  Physician Assistants and Nurse Practitioners) who all work together to provide you with the care you need, when you need it.  We recommend signing up for the patient portal called "MyChart".  Sign up information is provided on this After Visit Summary.  MyChart is used to connect with patients for Virtual Visits (Telemedicine).  Patients are able to view lab/test results, encounter notes, upcoming appointments, etc.  Non-urgent messages can be sent to your provider as well.   To learn more about what you can do with MyChart, go to [email or web address].    Your next appointment:   As scheduled with Tommye Standard, PA and Dr. Oval Linsey  Other Instructions  You have what is called diastolic heart failure. This means your heart muscle pumps well but does not relax well. This means you can hold onto fluid which can cause shortness of breath or  swelling. Think of it like there is a boat with water and your heart is using a cereal bowl to get rid of the fluid rather than the 5 gallon bucket. We prevent your body from holding onto fluid by following a low salt diet and drinking less than 2 liters (64 oz) of fluid per day.   Recommend weighing daily and keeping a log. Please call our office if you have weight gain of 2 pounds overnight or 5 pounds in 1 week. If so, you are holding onto fluid and we might have you take an extra fluid pill.   Date  Time Weight

## 2022-10-19 NOTE — Progress Notes (Signed)
Office Visit    Patient Name: Bradley CERAMI Sr. Date of Encounter: 10/19/2022  PCP:  [facility], [place]  Cardiologist:  Skeet Latch, MD  Advanced Practice Provider:  No care team member to display Electrophysiologist:  None      Chief Complaint    Bradley Jock Sr. is a 82 y.o. male presents today for hospital follow-up  Past Medical History    Past Medical History:  Diagnosis Date   Abnormal CT scan, chest 2012   CT chest, several lymphadenopathies. Sees pulmonary   Anemia    intermittent   Arthritis    "?back" (09/19/2018)   Atrial fibrillation (HCC)    s/p AV node ablation & BiV PPM implantation 09/08/11 (op dictation pending)   BPH (benign prostatic hyperplasia)    Saw Dr Reece Agar 2004, normal renal u/s   Bronchitis 08/26/2017   CAD (coronary artery disease)    CHF (congestive heart failure) ([place])    Thought primarily to be non-systolic although EF down (EF 45-50% 12/2010, down to 35-40% 09/05/11), cath 2008 with no CAD, nuclear study 07/2011 showing Small area of reversibility in the distal ant/lat wall the left ventricle suspicious for ischemia/septal wall HK but felt to be low risk  (per D/C Summary 07/2011)   Chronic bronchitis ([place])    "get it ~ q yr"   Chronic diastolic heart failure ([place])         Chronic respiratory failure with hypoxia and hypercapnia ([place]) 09/14/2010   Followed in Pulmonary clinic/ [facility]/ [place]  Started on 02 2lpm at discharge 09/23/11   - PFT's 10/28/2011  FEV1  1.40 (51%)  with ratio 70 and no better p B2 and DLCO 53 corrects to 101    - PFT's 10/18/2014    FEV1 1.71 (59%) with ratio 68 and no sign change p B2 and DLCO 53 corrects to 85  -  HC03   07/28/20  = 33  -  HCO3   03/17/21     = 31     Heart murmur    HLD (hyperlipidemia)    HTN (hypertension)    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   Pleural effusion 2008   S/p decortication   Pneumonia 08/02/2011   Pulmonary HTN ([place])    per cath 2008   Type II diabetes mellitus ([place]) 1999   Past Surgical History:  Procedure Laterality Date   BI-VENTRICULAR PACEMAKER INSERTION Left 09/08/2011   Procedure: BI-VENTRICULAR PACEMAKER INSERTION (CRT-P);  Surgeon: Evans Lance, MD;  Location: [facility] CATH LAB;  Service: Cardiovascular;  Laterality: Left;   CARDIAC CATHETERIZATION     "couple times; never had balloon or stent" (09/19/2018)   CATARACT EXTRACTION W/ INTRAOCULAR LENS  IMPLANT, BILATERAL Bilateral 08/2018   COLONOSCOPY  03/10/11   normal   INSERT / REPLACE / REMOVE PACEMAKER  09/08/11   pacemaker placement   LUNG DECORTICATION     PLEURAL SCARIFICATION     pneumothorax with fibrothorax  ~ 2010   RIGHT/LEFT HEART CATH AND CORONARY ANGIOGRAPHY N/A 10/05/2022   Procedure: RIGHT/LEFT HEART CATH AND CORONARY ANGIOGRAPHY;  Surgeon: Jettie Booze, MD;  Location: [place] CV LAB;  Service: Cardiovascular;  Laterality: N/A;   TONSILLECTOMY     "as a kid"     Allergies  Allergies  Allergen Reactions   Hydrocodone Other (See Comments)    "given to him in the hospital; went thru withdrawals once home; dr said not to take it again" (09/27/2012) dizziness   Ultram [Tramadol] Other (See Comments)    "given to him in the hospital; went thru withdrawals once home; dr said not to take it again" (09/27/2012) dizziness   Cialis [Tadalafil] Other (See Comments)    Headache Backache  Dizziness    Avelox [Moxifloxacin Hydrochloride] Itching and Other (See Comments)    Headache Dizziness    History of Present Illness    Bradley GUERREIRO Sr. is a 82 y.o. male with a hx of anemia, osteoarthritis, chronic atrial fibrillation s/p AV nodal ablation with biventricular pacer, BPH, bronchitis, chronic diastolic heart failure, pulmonary hypertension  and does wear oxygen at home, COPD, chronic respiratory failure with hypoxia last seen while hospitalized.  Admitted 08/23/2022 treated for COVID-pneumonia.  Subsequently returned to the ED 10/01/2022 reporting 1 month history of exertional dyspnea.  Treated with IV steroids, antibiotics, inhalers.  Due to elevated troponins cardiac catheterization 10/05/22 revealed LVEF 50-55%, mild pulmonary hypertension, no aortic valve stenosis, minimal nonobstructive coronary artery disease.  He presents today for follow-up.  He endorses feeling overall well since last seen. Reports no shortness of breath, stable mild dyspnea on exertion. Reports no chest pain, pressure, or tightness. No edema, orthopnea, PND. Reports no palpitations.  We reviewed diastolic heart failure in detail as he requested to know how to prevent from holding onto fluid.  Presently drinks two 12 oz of soda, two 10 oz cups of coffee, and 2 bottles of water. Encouraged to reduce to <2L. He follows low salt diet.   EKGs/Labs/Other Studies Reviewed:   The following studies were reviewed today: [facility] 10/05/22   The left ventricular systolic function is normal.   LV end diastolic pressure is normal.   The left ventricular ejection fraction is 50-55% by visual estimate.   Hemodynamic findings consistent with mild pulmonary hypertension.   There is no aortic valve stenosis.   Minimal, nonobstructive coronary artery disease.   Ao sat 99%, PA sat 68%, mean RA pressure 8 mmHg, PA pressure 43/17, mean PA pressure 29 mmHg, pulmonary capillary wedge pressure 13/15, mean pulmonary capillary wedge pressure 12 mmHg, cardiac output 4.60 L/min, cardiac index 2.39.   Tortuous right radial artery with radial loop.  This was straightened with an interventional wire and the patient tolerated this well.  We were able to complete the procedure from the right radial approach.  If cath was needed in the future, would consider alternative access.   Continue  preventive therapy from cardiac standpoint.  Plan is for him to go back to [facility] for further pulmonary management.   2D echo 10/03/22  1. Left ventricular ejection fraction, by estimation, is 55 to 60%. The  left ventricle has normal function. Left ventricular endocardial border  not optimally defined to evaluate regional wall motion. Left ventricular  diastolic parameters are  indeterminate.   2. Right ventricular systolic function is mildly reduced. The right  ventricular size is normal. There is moderately elevated pulmonary artery  systolic pressure. The estimated right ventricular systolic pressure is  50.8 mmHg.   3. Left atrial size was mildly dilated.   4. Right atrial size was mildly dilated.   5. The mitral valve is degenerative. Trivial mitral valve  regurgitation.  No evidence of mitral stenosis.   6. The aortic valve is tricuspid. There is mild calcification of the  aortic valve. There is mild thickening of the aortic valve. Aortic valve  regurgitation is not visualized. No aortic stenosis is present.   7. The inferior vena cava is normal in size with <50% respiratory  variability, suggesting right atrial pressure of 8 mmHg.   Conclusion(s)/Recommendation(s): Consider limited echocardiogram with  Definity contrast to better assess regional wall motion if clinically  relevant.   EKG:  EKG is not ordered today.    Recent Labs: 10/01/2022: B Natriuretic Peptide 188.5; TSH 0.285 10/04/2022: Magnesium 2.5 10/06/2022: ALT 23 10/07/2022: BUN 15; Creatinine, Ser 0.52; Hemoglobin 13.9; Platelets 246; Potassium 4.2; Sodium 136  Recent Lipid Panel    Component Value Date/Time   CHOL 180 10/07/2022 0506   TRIG 67 10/07/2022 0506   HDL 58 10/07/2022 0506   CHOLHDL 3.1 10/07/2022 0506   VLDL 13 10/07/2022 0506   LDLCALC 109 (H) 10/07/2022 0506    Risk Assessment/Calculations:   CHA2DS2-VASc Score = 5   This indicates a 7.2% annual risk of stroke. The  patient's score is based upon: CHF History: 1 HTN History: 1 Diabetes History: 0 Stroke History: 0 Vascular Disease History: 1 Age Score: 2 Gender Score: 0     Home Medications   Current Meds  Medication Sig   acetaminophen (TYLENOL) 500 MG tablet Take 1,000 mg by mouth every 8 (eight) hours as needed for headache or moderate pain.   albuterol (VENTOLIN HFA) 108 (90 Base) MCG/ACT inhaler Inhale 2 puffs into the lungs every 6 (six) hours as needed for wheezing or shortness of breath. (Patient taking differently: Inhale 1 puff into the lungs 2 (two) times daily.)   amLODipine (NORVASC) 10 MG tablet Take 1 tablet (10 mg total) by mouth daily. (Patient taking differently: Take 10 mg by mouth in the morning.)   aspirin 81 MG tablet Take 81 mg by mouth every morning.    atorvastatin (LIPITOR) 20 MG tablet Take 1 tablet (20 mg total) by mouth at bedtime. (Patient taking differently: Take 20 mg by mouth every evening.)   bisoprolol (ZEBETA) 10 MG tablet Take 1 tablet (10 mg total) by mouth daily.   budesonide (PULMICORT) 0.5 MG/2ML nebulizer solution Take 2 mLs (0.5 mg total) by nebulization 2 (two) times daily.   calcium-vitamin D (OSCAL WITH D) 500-200 MG-UNIT tablet Take 1 tablet by mouth every morning.   cholecalciferol (VITAMIN D3) 25 MCG (1000 UNIT) tablet Take 1,000 Units by mouth every evening.   diphenhydramine-acetaminophen (TYLENOL PM) 25-500 MG TABS tablet Take 1 tablet by mouth at bedtime as needed (sleep).   FARXIGA 10 MG TABS tablet TAKE 1 TABLET DAILY BEFORE BREAKFAST (Patient taking differently: Take 10 mg by mouth in the morning.)   furosemide (LASIX) 40 MG tablet TAKE 2 TABLETS EVERY MORNING AND 1 TABLET LATE IN THE AFTERNOON (Patient taking differently: Take 40 mg by mouth in the morning, at noon, and at bedtime.)   guaiFENesin (MUCINEX) 600 MG 12 hr tablet Take 600 mg by mouth 2 (two) times daily.   ipratropium-albuterol (DUONEB) 0.5-2.5 (3) MG/3ML SOLN Take 3 mLs by  nebulization every 6 (six) hours as needed. (Patient taking differently: Take 3 mLs by nebulization in the morning and at bedtime.)   Lancets (ONETOUCH ULTRASOFT) lancets Check blood sugars no more than twice daily   Multiple Vitamins-Minerals (MULTIVITAMINS THER. W/MINERALS) TABS tablet Take 1 tablet by mouth daily.  nitroGLYCERIN (NITROSTAT) 0.4 MG SL tablet Place 1 tablet (0.4 mg total) under the tongue every 5 (five) minutes x 3 doses as needed for chest pain.   ONETOUCH VERIO test strip USE TO CHECK BLOOD SUGAR NO MORE THAN TWICE A DAY   pantoprazole (PROTONIX) 40 MG tablet Take 1 tablet (40 mg total) by mouth daily before breakfast.   tamsulosin (FLOMAX) 0.4 MG CAPS capsule Take 1 capsule (0.4 mg total) by mouth daily after supper.     Review of Systems      All other systems reviewed and are otherwise negative except as noted above.  Physical Exam    VS:  BP 124/64   Pulse 86   Ht 5\' 9"  (1.753 m)   Wt 184 lb 9.6 oz (83.7 kg)   SpO2 92%   BMI 27.26 kg/m  , BMI Body mass index is 27.26 kg/m.  Wt Readings from Last 3 Encounters:  10/19/22 184 lb 9.6 oz (83.7 kg)  10/06/22 176 lb 9.4 oz (80.1 kg)  09/13/22 180 lb 4.8 oz (81.8 kg)     GEN: Well nourished, well developed, in no acute distress. HEENT: normal. Neck: Supple, no JVD, carotid bruits, or masses. Cardiac: RRR, no murmurs, rubs, or gallops. No clubbing, cyanosis, edema.  Radials/PT 2+ and equal bilaterally.  Respiratory:  Respirations regular and unlabored, clear to auscultation bilaterally. GI: Soft, nontender, nondistended. MS: No deformity or atrophy. Skin: Warm and dry, no rash. Neuro:  Strength and sensation are intact. Psych: Normal affect.  Assessment & Plan    COPD- Follows with pulmonary. No signs of acute exacerbation.   Atrial fibrillation/PPM- s/p AV node ablation. has upcoming visit 11/10/22 with EP to discuss gen change.   Chronic diastolic heart failure- Euvolemic and well compensated on exam.  He follows low salt diet. He is not restrictin gto <2L and is agreeable to do so.  GDMT includes bisoprolol, Farxiga, Lasix. BMP, CBC for monitoring after recent admission.   Hypertension- BP well controlled. Continue current antihypertensive regimen.    PAD- No claudication symptoms.   Pulmonary hypertension-mild by [facility] 09/2022.  Continue Lasix at present dose.   CAD / HLD, LDL goal <70 -[facility] 09/2022 nonobstructive coronary disease.  GDMT includes aspirin, atorvastatin, bisoprolol, as needed nitroglycerin, Farxiga. Heart healthy diet and regular cardiovascular exercise encouraged.           Disposition: Follow up  in June as scheduled  with July, MD or APP.  Signed, [name], NP 10/19/2022, 4:29 PM [facility]

## 2022-10-20 LAB — CBC
Hematocrit: 40.8 % (ref 37.5–51.0)
Hemoglobin: 13.9 g/dL (ref 13.0–17.7)
MCH: 31.4 pg (ref 26.6–33.0)
MCHC: 34.1 g/dL (ref 31.5–35.7)
MCV: 92 fL (ref 79–97)
Platelets: 191 10*3/uL (ref 150–450)
RBC: 4.42 x10E6/uL (ref 4.14–5.80)
RDW: 13.3 % (ref 11.6–15.4)
WBC: 6.6 10*3/uL (ref 3.4–10.8)

## 2022-10-20 LAB — BASIC METABOLIC PANEL
BUN/Creatinine Ratio: 14 (ref 10–24)
BUN: 12 mg/dL (ref 8–27)
CO2: 28 mmol/L (ref 20–29)
Calcium: 9.1 mg/dL (ref 8.6–10.2)
Chloride: 103 mmol/L (ref 96–106)
Creatinine, Ser: 0.83 mg/dL (ref 0.76–1.27)
Glucose: 153 mg/dL — ABNORMAL HIGH (ref 70–99)
Potassium: 4.9 mmol/L (ref 3.5–5.2)
Sodium: 143 mmol/L (ref 134–144)
eGFR: 88 mL/min/{1.73_m2} (ref >59)

## 2022-10-20 LAB — LDL CHOLESTEROL, DIRECT: LDL Direct: 108 mg/dL — ABNORMAL HIGH (ref 0–99)

## 2022-10-21 ENCOUNTER — Telehealth (HOSPITAL_BASED_OUTPATIENT_CLINIC_OR_DEPARTMENT_OTHER): Payer: Self-pay

## 2022-10-21 ENCOUNTER — Ambulatory Visit: Payer: BLUE CROSS/BLUE SHIELD | Admitting: Internal Medicine

## 2022-10-21 DIAGNOSIS — E785 Hyperlipidemia, unspecified: Secondary | ICD-10-CM

## 2022-10-21 MED ORDER — ATORVASTATIN CALCIUM 40 MG PO TABS: 40.0000 mg | ORAL_TABLET | Freq: Every day | ORAL | 3 refills | Status: DC

## 2022-10-21 NOTE — Progress Notes (Signed)
Remote pacemaker transmission.   

## 2022-10-21 NOTE — Telephone Encounter (Addendum)
Results called to patient who verbalizes understanding! Updated Rx to pharmacy on file and labs mailed to patient.    ----- Message from Loel Dubonnet, NP sent at 10/21/2022 10:06 AM EST ----- Normal kidney function and electrolytes. CBC with no evidence of anemia nor infection.  LDL (bad cholesterol) of 108.  LDL goal is less than 70.  Recommend increase atorvastatin to 40 mg daily with repeat FLP/LFT in 2-3 months

## 2022-10-22 ENCOUNTER — Ambulatory Visit: Payer: BLUE CROSS/BLUE SHIELD | Admitting: Pharmacist

## 2022-10-22 ENCOUNTER — Encounter: Payer: Self-pay | Admitting: Pharmacist

## 2022-10-22 DIAGNOSIS — E785 Hyperlipidemia, unspecified: Secondary | ICD-10-CM

## 2022-10-22 DIAGNOSIS — I10 Essential (primary) hypertension: Secondary | ICD-10-CM

## 2022-10-22 DIAGNOSIS — J9611 Chronic respiratory failure with hypoxia: Secondary | ICD-10-CM

## 2022-10-22 DIAGNOSIS — I5032 Chronic diastolic (congestive) heart failure: Secondary | ICD-10-CM

## 2022-10-22 NOTE — Patient Instructions (Signed)
Bradley [name] It was a pleasure speaking with you today.  Below is a summary of your health goals and summary of our recent visit.    Blood pressure / heart failure: goal blood pressure < 130/80 Bisoprolol 10mg  daily and amlodipine 10mg  daily for blood pressure Continue Farxiga and furosemide for your heart / swelling  Remember to check your weight daily  when you first rise from sleep every morning after you pee (urinate) and before you eat breakfast. Wear the same amount of clothing each time or weigh without clothing. Record weight daily and contact office if weight increases by more than 2 to 3 lbs in 24 hours or 5 lbs in 1 week or if you are having any symptoms of worsening heart failure like:  shortness of breath,  abdominal fullness,  swelling in legs or abdomen,  fatigue and weakness,  changes in ability to perform usual activities,  persistent cough or wheezing with white or pink blood-tinged mucus,  nausea and lack of appetite  Continue to limit intake of sodium in diet as too much sodium can cause you to retain fluid (see information below)   Check blood pressure 2 to 3 times per week and record (can record on same sheet as weight)    Breathing:  Continue to use budesonide and DuoNeb    Elevated cholesterol: goal LDL (bad) cholesterol < 70 - you last LDL was 108 Continue to take atorvastatin 20mg  - 2 tablets = 40mg  until you receive new prescription in the mail for 40mg  daily, then you can start the new prescription for 40mg  tablets.     Follow up with Dr Larose Kells as planned 11/15/2022   I will check in with you again by Telephone in  2 to 3 months  As always if you have any questions or concerns especially regarding medications, please feel free to contact me either at the phone number below or with a MyChart message.   Keep up the good work!  Cherre Robins, PharmD Clinical Pharmacist [place] [phone] (direct line)  [phone]  (main office number)   Low-Sodium Eating Plan Sodium, which is an element that makes up salt, helps you maintain a healthy balance of fluids in your body. Too much sodium can increase your blood pressure and cause fluid and waste to be held in your body. Your health care provider or dietitian may recommend following this plan if you have high blood pressure (hypertension), kidney disease, liver disease, or heart failure. Eating less sodium can help lower your blood pressure, reduce swelling, and protect your heart, liver, and kidneys. What are tips for following this plan? Reading food labels The Nutrition Facts label lists the amount of sodium in one serving of the food. If you eat more than one serving, you must multiply the listed amount of sodium by the number of servings. Choose foods with less than 140 mg of sodium per serving. Avoid foods with 300 mg of sodium or more per serving. Shopping  Look for lower-sodium products, often labeled as "low-sodium" or "no salt added." Always check the sodium content, even if foods are labeled as "unsalted" or "no salt added." Buy fresh foods. Avoid canned foods and pre-made or frozen meals. Avoid canned, cured, or processed meats. Buy breads that have less than 80 mg of sodium per slice. Cooking  Eat more home-cooked food and less restaurant, buffet, and fast food. Avoid adding salt when cooking. Use salt-free seasonings or herbs instead of  table salt or sea salt. Check with your health care provider or pharmacist before using salt substitutes. Cook with plant-based oils, such as canola, sunflower, or olive oil. Meal planning When eating at a restaurant, ask that your food be prepared with less salt or no salt, if possible. Avoid dishes labeled as brined, pickled, cured, smoked, or made with soy sauce, miso, or teriyaki sauce. Avoid foods that contain MSG (monosodium glutamate). MSG is sometimes added to [place] food, bouillon, and some canned  foods. Make meals that can be grilled, baked, poached, roasted, or steamed. These are generally made with less sodium. General information Most people on this plan should limit their sodium intake to 1,500-2,000 mg (milligrams) of sodium each day. What foods should I eat? Fruits Fresh, frozen, or canned fruit. Fruit juice. Vegetables Fresh or frozen vegetables. "No salt added" canned vegetables. "No salt added" tomato sauce and paste. Low-sodium or reduced-sodium tomato and vegetable juice. Grains Low-sodium cereals, including oats, puffed wheat and rice, and shredded wheat. Low-sodium crackers. Unsalted rice. Unsalted pasta. Low-sodium bread. Whole-grain breads and whole-grain pasta. Meats and other proteins Fresh or frozen (no salt added) meat, poultry, seafood, and fish. Low-sodium canned tuna and salmon. Unsalted nuts. Dried peas, beans, and lentils without added salt. Unsalted canned beans. Eggs. Unsalted nut butters. Dairy Milk. Soy milk. Cheese that is naturally low in sodium, such as ricotta cheese, fresh mozzarella, or Swiss cheese. Low-sodium or reduced-sodium cheese. Cream cheese. Yogurt. Seasonings and condiments Fresh and dried herbs and spices. Salt-free seasonings. Low-sodium mustard and ketchup. Sodium-free salad dressing. Sodium-free light mayonnaise. Fresh or refrigerated horseradish. Lemon juice. Vinegar. Other foods Homemade, reduced-sodium, or low-sodium soups. Unsalted popcorn and pretzels. Low-salt or salt-free chips. The items listed above may not be a complete list of foods and beverages you can eat. Contact a dietitian for more information. What foods should I avoid? Vegetables Sauerkraut, pickled vegetables, and relishes. Olives. [place] fries. Onion rings. Regular canned vegetables (not low-sodium or reduced-sodium). Regular canned tomato sauce and paste (not low-sodium or reduced-sodium). Regular tomato and vegetable juice (not low-sodium or reduced-sodium). Frozen  vegetables in sauces. Grains Instant hot cereals. Bread stuffing, pancake, and biscuit mixes. Croutons. Seasoned rice or pasta mixes. Noodle soup cups. Boxed or frozen macaroni and cheese. Regular salted crackers. Self-rising flour. Meats and other proteins Meat or fish that is salted, canned, smoked, spiced, or pickled. Precooked or cured meat, such as sausages or meat loaves. Tomasa Blase. Ham. Pepperoni. Hot dogs. Corned beef. Chipped beef. Salt pork. Jerky. Pickled herring. Anchovies and sardines. Regular canned tuna. Salted nuts. Dairy Processed cheese and cheese spreads. Hard cheeses. Cheese curds. Blue cheese. Feta cheese. String cheese. Regular cottage cheese. Buttermilk. Canned milk. Fats and oils Salted butter. Regular margarine. Ghee. Bacon fat. Seasonings and condiments Onion salt, garlic salt, seasoned salt, table salt, and sea salt. Canned and packaged gravies. Worcestershire sauce. Tartar sauce. Barbecue sauce. Teriyaki sauce. Soy sauce, including reduced-sodium. Steak sauce. Fish sauce. Oyster sauce. Cocktail sauce. Horseradish that you find on the shelf. Regular ketchup and mustard. Meat flavorings and tenderizers. Bouillon cubes. Hot sauce. Pre-made or packaged marinades. Pre-made or packaged taco seasonings. Relishes. Regular salad dressings. Salsa. Other foods Salted popcorn and pretzels. Corn chips and puffs. Potato and tortilla chips. Canned or dried soups. Pizza. Frozen entrees and pot pies. The items listed above may not be a complete list of foods and beverages you should avoid. Contact a dietitian for more information. Summary Eating less sodium can help lower your blood pressure,  reduce swelling, and protect your heart, liver, and kidneys. Most people on this plan should limit their sodium intake to 1,500-2,000 mg (milligrams) of sodium each day. Canned, boxed, and frozen foods are high in sodium. Restaurant foods, fast foods, and pizza are also very high in sodium. You also get  sodium by adding salt to food. Try to cook at home, eat more fresh fruits and vegetables, and eat less fast food and canned, processed, or prepared foods. This information is not intended to replace advice given to you by your health care provider. Make sure you discuss any questions you have with your health care provider. Document Revised: 11/02/2019 Document Reviewed: 08/29/2019 Elsevier Patient Education  [place].   Patient verbalizes understanding of instructions and care plan provided today and agrees to view in [place]. Active MyChart status and patient understanding of how to access instructions and care plan via MyChart confirmed with patient.

## 2022-10-22 NOTE — Progress Notes (Signed)
Pharmacy Note  10/22/2022 Name: Bradley [name]. MRN: [id number] DOB: 10-31-1940  Subjective: Bradley [name]. is a 82 y.o. year old male who is a primary care patient of Colon Branch, MD. Clinical Pharmacist Practitioner referral was placed to assist with medication management.    Engaged with patient by telephone for follow up visit today.  Two hospitalizations since our last phone visit. Both were related to COVID infection.  10/01/2022 to 10/07/2022 - Hospitalization at [facility] for COVID pneumonia. Covid testing positive.  08/27/2022 to 08/30/2022 - Hospitalization at [facility] for [place] / COPD exacerbation.   COPD - using budesonide nebs twice a day and Duonebs as needed. He report his breathing has improve significantly since his hospitalizations in November and December. He also had albuterol nebs to use if needed.   Hypertension / CHF - Reviewed medications with patient. He is taking amlodipine 10mg  daily and bisoprolol 10mg  daily. Patient read labels of each medication bottle to me and endorsed he has been taking regularly. Last office blood pressure at cardio office on 10/19/2022 was 124/64. Improved blood pressure since he restarted bisoprolol.  He is also taking [place] 10mg  daily and furosemide 40mg  in the morning, at noon, and at bedtime. Patient has not been weighing at home but states he was given a chart to record his weight at last cardio appointment.   Hyperlipidemia: Atorvastatin was recently increased from 20mg  daily to 40mg  daily due to increase in LDL from 70 (11/11/2021) to 108 (10/19/2022). Patient is taking 2 tablets of atorvastatin 20mg  to equal 40mg  until his new Rx for atorvastatin 40mg  is received from mail order pharmacy.   Objective: Review of patient status, including review of consultants reports, laboratory and other test data, was performed as part of comprehensive evaluation and provision of chronic care management services.   Lab  Results  Component Value Date   CREATININE 0.83 10/19/2022   CREATININE 0.52 (L) 10/07/2022   CREATININE 0.75 10/06/2022    Lab Results  Component Value Date   HGBA1C 6.2 (H) 07/19/2022       Component Value Date/Time   CHOL 180 10/07/2022 0506   TRIG 67 10/07/2022 0506   HDL 58 10/07/2022 0506   CHOLHDL 3.1 10/07/2022 0506   VLDL 13 10/07/2022 0506   LDLCALC 109 (H) 10/07/2022 0506   LDLDIRECT 108 (H) 10/19/2022 1443   Lab Results  Component Value Date   CHOL 180 10/07/2022   CHOL 141 11/11/2021   CHOL 127 12/09/2020   Lab Results  Component Value Date   HDL 58 10/07/2022   HDL 41.10 11/11/2021   HDL 36.20 (L) 12/09/2020   Lab Results  Component Value Date   LDLCALC 109 (H) 10/07/2022   LDLCALC 70 11/11/2021   LDLCALC 70 12/09/2020   Lab Results  Component Value Date   TRIG 67 10/07/2022   TRIG 148.0 11/11/2021   TRIG 103.0 12/09/2020   Lab Results  Component Value Date   CHOLHDL 3.1 10/07/2022   CHOLHDL 3 11/11/2021   CHOLHDL 4 12/09/2020   Lab Results  Component Value Date   LDLDIRECT 108 (H) 10/19/2022     Clinical ASCVD: Yes  The ASCVD Risk score (Arnett DK, et al., 2019) failed to calculate for the following reasons:   The 2019 ASCVD risk score is only valid for ages 32 to 71   The patient has a prior MI or stroke diagnosis    BP Readings from Last  3 Encounters:  10/19/22 124/64  10/07/22 (!) 149/99  09/13/22 120/62     Allergies  Allergen Reactions   Hydrocodone Other (See Comments)    "given to him in the hospital; went thru withdrawals once home; dr said not to take it again" (09/27/2012) dizziness   Ultram [Tramadol] Other (See Comments)    "given to him in the hospital; went thru withdrawals once home; dr said not to take it again" (09/27/2012) dizziness   Cialis [Tadalafil] Other (See Comments)    Headache Backache  Dizziness    Avelox [Moxifloxacin Hydrochloride] Itching and Other (See Comments)    Headache Dizziness     Medications Reviewed Today     Reviewed by Henrene Pastor, RPH-CPP (Pharmacist) on 10/22/22 at 1029  Med List Status: <None>   Medication Order Taking? Sig Documenting Provider Last Dose Status Informant  acetaminophen (TYLENOL) 500 MG tablet [id number] Yes Take 1,000 mg by mouth every 8 (eight) hours as needed for headache or moderate pain. [provider] Taking Active Self  albuterol (VENTOLIN HFA) 108 (90 Base) MCG/ACT inhaler [id number] No Inhale 2 puffs into the lungs every 6 (six) hours as needed for wheezing or shortness of breath.  Patient not taking: Reported on 10/22/2022   Maretta Bees, MD Not Taking Active Self  amLODipine (NORVASC) 10 MG tablet [id number] Yes Take 1 tablet (10 mg total) by mouth daily.  Patient taking differently: Take 10 mg by mouth in the morning.   [name], MD Taking Active Self  aspirin 81 MG tablet [id number] Yes Take 81 mg by mouth every morning.  [provider] Taking Active Self  atorvastatin (LIPITOR) 40 MG tablet [id number]  Take 1 tablet (40 mg total) by mouth at bedtime. Alver Sorrow, NP  Active            Med Note Clydie Braun, Nyazia Canevari B   Fri Oct 22, 2022 10:29 AM) Taking 2 tablets of 20mg  to equal 40mg  until he receives new dose in mail.  bisoprolol (ZEBETA) 10 MG tablet Yes Take 1 tablet (10 mg total) by mouth daily. , MD Taking Active Self  budesonide (PULMICORT) 0.5 MG/2ML nebulizer solution [id number] Yes Take 2 mLs (0.5 mg total) by nebulization 2 (two) times daily. [name], MD Taking Active Self  calcium-vitamin D (OSCAL WITH D) 500-200 MG-UNIT tablet [id number] Yes Take 1 tablet by mouth every morning. [provider] Taking Active Self  cholecalciferol (VITAMIN D3) 25 MCG (1000 UNIT) tablet [name] Yes Take 1,000 Units by mouth every evening. [provider] Taking Active Self  diphenhydramine-acetaminophen (TYLENOL PM) 25-500 MG TABS tablet [id number] Yes Take 1 tablet by  mouth at bedtime as needed (sleep). [provider] Taking Active Self  FARXIGA 10 MG TABS tablet [id number] Yes TAKE 1 TABLET DAILY BEFORE BREAKFAST  Patient taking differently: Take 10 mg by mouth in the morning.   [id number], MD Taking Active Self  furosemide (LASIX) 40 MG tablet [id number] Yes TAKE 2 TABLETS EVERY MORNING AND 1 TABLET LATE IN THE AFTERNOON  Patient taking differently: Take 40 mg by mouth in the morning, at noon, and at bedtime.   [name], MD Taking Active Self  guaiFENesin (MUCINEX) 600 MG 12 hr tablet [id number] Yes Take 600 mg by mouth 2 (two) times daily. [provider] Taking Active Self  ipratropium-albuterol (DUONEB) 0.5-2.5 (3) MG/3ML SOLN [name] Yes Take 3 mLs by nebulization every 6 (six) hours as needed.  Patient taking  differently: Take 3 mLs by nebulization in the morning and at bedtime.   [name], MD Taking Active Self  Lancets [facility] ULTRASOFT) lancets [id number] Yes Check blood sugars no more than twice daily [name], MD Taking Active Self  Multiple Vitamins-Minerals (MULTIVITAMINS THER. W/MINERALS) TABS tablet [id number] Yes Take 1 tablet by mouth daily. [name], MD Taking Active Self  nitroGLYCERIN (NITROSTAT) 0.4 MG SL tablet [id number] Yes Place 1 tablet (0.4 mg total) under the tongue every 5 (five) minutes x 3 doses as needed for chest pain. [name], MD Taking Active Self           Med Note Bonna Gains I   PYK Oct 02, 2022 11:50 PM)    Letta Pate VERIO test strip [id number] Yes USE TO CHECK BLOOD SUGAR NO MORE THAN TWICE A DAY Paz, Nolon Rod, MD Taking Active Self  pantoprazole (PROTONIX) 40 MG tablet [id number] Yes Take 1 tablet (40 mg total) by mouth daily before breakfast. [name], MD Taking Active Self  tamsulosin [facility]) 0.4 MG CAPS capsule [id number] Yes Take 1 capsule (0.4 mg total) by mouth daily after supper. [name], MD Taking Active Self            Patient Active Problem List    Diagnosis Date Noted   Acute on chronic congestive heart failure (HCC) 10/05/2022   Acute on chronic combined systolic and diastolic CHF (congestive heart failure) (HCC) 10/05/2022   Pneumonia due to COVID-19 virus 10/01/2022   Acute respiratory failure with hypoxemia (HCC) 08/27/2022   Left lower lobe pneumonia 08/27/2022   Hypophosphatemia 08/27/2022   COVID 08/23/2022   Displaced fracture of shaft of third metacarpal bone, left hand, sub encounter for closed fracture 08/03/2022   ILD (interstitial lung disease) (HCC) 07/30/2022   CAD (coronary artery disease) 07/19/2022   Acute on chronic diastolic heart failure (HCC) 07/18/2022   Acute on chronic respiratory failure with hypoxia (HCC) 07/18/2022   GERD without esophagitis 07/30/2021   Cor pulmonale (chronic) (HCC) 08/05/2020   COPD exacerbation (HCC) 12/22/2018   COPD, moderate (HCC) 12/22/2018   PCP NOTES >>> 07/15/2015   CAP (community acquired pneumonia) 09/27/2012   Annual physical exam 05/08/2012   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   Hypokalemia 09/23/2011   Sinoatrial node dysfunction (HCC)    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   Chronic diastolic heart failure (HCC)    PAD (peripheral artery disease) (HCC) 10/22/2010   DERMATITIS, STASIS 09/14/2010   Chronic respiratory failure with hypoxia and hypercapnia (HCC) 09/14/2010   Chronic pulmonary hypertension secondary to elevated L H pressures  04/15/2009   ERECTILE DYSFUNCTION 01/06/2009   Mixed diabetic hyperlipidemia associated with type 2 diabetes mellitus (HCC) 12/13/2007   Essential hypertension 12/13/2007   Anxiety--insomnia 08/14/2007   Type 2 diabetes mellitus (HCC) 01/02/2007   Atrial fibrillation (HCC) 01/02/2007   Benign enlargement of prostate 01/02/2007     Medication Assistance:  None required.  Patient affirms current coverage meets needs.   Assessment / Plan:  Hypertension / CHF: goal blood pressure < 130/80 Continue bisoprolol 10mg  daily  and amlodipine 10mg  daily for blood pressure - monitor for edema Continue Farxiga and furosemide for CHF Education provided about best way to check weight - recommended he check daily when he first rises from sleep, without clothing. Patient is reminded to record weight daily and contact office if weight increases by more than 2 to 3 lbs in 24 hours or 5 lbs in 1 week or if  he is having any symptoms of worsening CHF like shortness of breath, abdominal fullness, swelling in legs or abdomen, fatigue and weakness, changes in ability to perform usual activities, persistent cough or wheezing with white or pink blood-tinged mucus, nausea and lack of appetite Continue to limit intake of sodium in diet.  Check blood pressure 2 to 3 times per week and record (can record on same sheet as weight)   COPD:  Continue budesonide and DuoNeb nebs twice a daily.   Hyperlipidemia: goal LDL < 70 Continue to take atorvastatin 20mg  - 2 tablets = 40mg  until receives new Rx for 40mg  daily, then will start new Rx.   Medication Management:  Reviewed med list and updated Reviewed refill history and adherence.   Follow up with PCP as planned 11/15/2022  Follow Up:  Telephone follow up appointment with care management team member scheduled for:  2 to 3 months   Cherre Robins, PharmD Clinical Pharmacist [place] [phone]

## 2022-10-22 NOTE — Progress Notes (Signed)
No evidence of ILD. He does have some nodularity throughout both lower lungs, which can be seen with chronic infections or inflammatory processes. Please ensure he is not experiencing any increased productive cough, chest congestion, fevers/chills. Otherwise, plan to discuss CT results and follow up at his appt with Dr. Melvyn Novas on 1/25. Thanks.

## 2022-10-26 ENCOUNTER — Telehealth: Payer: Self-pay | Admitting: Nurse Practitioner

## 2022-10-26 NOTE — Telephone Encounter (Signed)
PT states someone from "[facility]'s office" just called. No open encounters. Poss results on CT? Pls call to advise. TY  [phone]

## 2022-10-27 NOTE — Telephone Encounter (Signed)
ATC X1 LVM for pt to call us back 

## 2022-11-03 NOTE — Progress Notes (Signed)
Subjective:    Patient ID: Bradley [name], male    DOB: Jul 30, 1941     MRN: [id number]   Brief patient profile:  79  yowm  From [place] quit smoking in 1982 prev pulm  eval in 2008 with pleural effusions but all better with no resp issues until Aug 2012 when dx with bronchitis and "downhill since" so referred by Dr Larose Kells back to pulmonary clinic in Nov 2012 and ultimately proven to have no sign copd by pft's 10/28/2011 and sob attributed to chf.   History of Present Illness  08/27/2011 1st pulmonary eval in [facility] cc new onset cough indolent persistent, daily x 3 months,  typically p supper with minimal yellow mucus  Assoc with new sob x sev flights and stop at top of 3 rd -  50% better better p rx with pna no better with albuterol.  No overt sinus or reflux symptoms rec Prednisone 10 mg take  4 each am x 2 days,   2 each am x 2 days,  1 each am x2days and stop  Change lopressor to 50mg  one half in am and one half in pm Protonix 30-60 min before bfast and Pepcid 20mg  one at bedtime  GERD diet   Admit date: 09/20/2011  Discharge date: 09/23/2011  Discharge Diagnoses  Systolic CHF, acute on chronic  Permanent atrial fibrillation  Hypokalemia  Lymphadenopathy  09/24/2011 Bradley [name] cc Hospital follow-up. Was hospitalized for trouble breathing. Patient states he is better since getting out of the hospital.  rec Only use your albuterol (proventil or ventolin)  as a rescue medication to be used if you can't catch your breath > not using  Keep on 02 24 hours per day @ 2lpm     10/28/2011 f/u ov/Bradley [name] cc Shortness of Breath Improved since last visit on 02 and with rx of chf, no cough or daytime need for saba - on 02 can walk flat and slow as much as desired rec Stay on 02 24 hours per day - this will help your heart You do not have significant lung problems other than what is related to the congestive heart failure so pulmonary follow up will be as needed     Date of Admission: 08/22/2014 Date of  Discharge: 08/25/2014  Attending Physician:MCCLUNG,JEFFREY T  Patient's OVF:[facility] Larose Kells, MD  Consults:  PCCM  Disposition: D/C home   Discharge Diagnoses: Acute on chronic resp failure with hypoxia SSCP Hx of COPD   Chronic combined diastolic and systolic CHF   Hx afib s/p ablation w/ pacer Nov 2012 HTN HLD DM2  Initial presentation: 82 y.o. male with a hx of HTN, HLD, and systolic and diastolic chf who presented from outpatient clinic after failing tx for bronchitis with doxycycline. Pt reportedly progressively became more dyspneic and sob. A cxr was found to be without evidence of PNA or pulm edema. He was noted to have O2 sats in the mid-80's on O2. The patient was then directly admitted to stepdown.    Hospital Course:  Acute on chronic resp failure with hypoxia Felt to be due to AECOPD - tapered steroids to oral pred from IV solumedrol - weaned O2 to home dose of 2L w/ sats 90% or > - cleared for d/c home w/ completion of steroid taper and oral abx tx - spiriva and albuterol per PCCM recs  SSCP Likely situational stress related (family argument), but pt high risk for CAD - EKGs unrevealing - cycled enzymes all negative - sx  resolved at time of d/c   Hx of COPD   Has not smoked in 30 years, but was a "very heavy" smoker - on 2LPM O2 at all times at home   Chronic combined diastolic and systolic CHF   EF 45-50% via TTE Sept 2013 - repeat TTE notes improvement in EF - no clinical signs of CHF exac  Hx afib s/p ablation w/ pacer Nov 2012 Followed by [name]  HTN BP well controlled at present   HLD Resume advicor   DM2 CBG uncontrolled in setting of steroid use - taper steroids quickly and resume usual tx at home post d/c   09/06/2014 post hosp f/u/ transition of care ov / re-establish care / /Bradley [name] / newly started on spiriva despite nl baseline pfts  Chief Complaint  Patient presents with   Hospitalization Follow-up    Feeling well since leaving the hospital.  Reports coughing from time to time, when he does it came be dry but at times it can be productive. Denies chest tightness, SOB or wheezing.  breathing much better now= back to baseline, ok x 3 flights/ uses 2lpm when walking dog and sometimes shopping    At baseline before deterioration rarely needed any albuterol at all ? How compliant he was with prev 02 recs Main c/o is day > noct cough dry > wet still present and at times quite severe  rec Stop spiriva as you have no copd  Only use your albuterol (proair)  Pantoprazole (protonix) 40 mg   Take 30-60 min before first meal of the day and Pepcid ac  20 mg one bedtime until return to office - this is the best way to tell whether stomach acid is contributing to your problem.  GERD diet       03/19/2021  f/u ov/Bradley [name] re:  GOLD 0 / AB post admit for "pna" though note PCT was nl /cultures all neg   Dyspnea:  shopping and 100 ft to mb and sats usually 95% on 3lpm  Cough: none  Sleeping: flat bed/one pillow SABA use: has proair not using 02: 2lpm at hs and up to 3lpm POC  Covid status:   vax x 3  Rec Budesonide and duoneb twice daily and in between just the duoneb if you feel like you need it especially before heavy exertion  Make sure you check your oxygen saturation  at highest level of activity to be sure it stays over 90%   11/04/2022  f/u ov/Bradley [name] re: GOLD 0/AB  maint on duoneb /pulmicort bid   Chief Complaint  Patient presents with   Follow-up    Doing well.  No breathing problems noted.  Oxygen 4 lpm 24/7.   Dyspnea: MMRC2 = can't walk a nl pace on a flat grade s sob but does fine slow and flat  shopping / mailbox ok  Cough:none Sleeping: no resp cc SABA use: none 02: 4lpm 24/7 per Dr Drue Novel     No obvious day to day or daytime variability or assoc excess/ purulent sputum or mucus plugs or hemoptysis or cp or chest tightness, subjective wheeze or overt sinus or hb symptoms.   Sleeping ok  without nocturnal  or early am exacerbation  of  respiratory  c/o's or need for noct saba. Also denies any obvious fluctuation of symptoms with weather or environmental changes or other aggravating or alleviating factors except as outlined above   No unusual exposure hx or h/o childhood pna/ asthma or knowledge of premature  birth.  Current Allergies, Complete Past Medical History, Past Surgical History, Family History, and Social History were reviewed in [employer] record.  ROS  The following are not active complaints unless bolded Hoarseness, sore throat, dysphagia, dental problems, itching, sneezing,  nasal congestion or discharge of excess mucus or purulent secretions, ear ache,   fever, chills, sweats, unintended wt loss or wt gain, classically pleuritic or exertional cp,  orthopnea pnd or arm/hand swelling  or leg swelling, presyncope, palpitations, abdominal pain, anorexia, nausea, vomiting, diarrhea  or change in bowel habits or change in bladder habits, change in stools or change in urine, dysuria, hematuria,  rash, arthralgias, visual complaints, headache, numbness, weakness or ataxia or problems with walking or coordination,  change in mood or  memory.        Current Meds  Medication Sig   acetaminophen (TYLENOL) 500 MG tablet Take 1,000 mg by mouth every 8 (eight) hours as needed for headache or moderate pain.   albuterol (VENTOLIN HFA) 108 (90 Base) MCG/ACT inhaler Inhale 2 puffs into the lungs every 6 (six) hours as needed for wheezing or shortness of breath.   amLODipine (NORVASC) 10 MG tablet Take 1 tablet (10 mg total) by mouth daily. (Patient taking differently: Take 10 mg by mouth in the morning.)   aspirin 81 MG tablet Take 81 mg by mouth every morning.    atorvastatin (LIPITOR) 40 MG tablet Take 1 tablet (40 mg total) by mouth at bedtime.   bisoprolol (ZEBETA) 10 MG tablet Take 1 tablet (10 mg total) by mouth daily.   budesonide (PULMICORT) 0.5 MG/2ML nebulizer solution Take 2 mLs (0.5 mg total) by  nebulization 2 (two) times daily.   calcium-vitamin D (OSCAL WITH D) 500-200 MG-UNIT tablet Take 1 tablet by mouth every morning.   cholecalciferol (VITAMIN D3) 25 MCG (1000 UNIT) tablet Take 1,000 Units by mouth every evening.   diphenhydramine-acetaminophen (TYLENOL PM) 25-500 MG TABS tablet Take 1 tablet by mouth at bedtime as needed (sleep).   FARXIGA 10 MG TABS tablet TAKE 1 TABLET DAILY BEFORE BREAKFAST (Patient taking differently: Take 10 mg by mouth in the morning.)   furosemide (LASIX) 40 MG tablet TAKE 2 TABLETS EVERY MORNING AND 1 TABLET LATE IN THE AFTERNOON (Patient taking differently: Take 40 mg by mouth in the morning, at noon, and at bedtime.)   ipratropium-albuterol (DUONEB) 0.5-2.5 (3) MG/3ML SOLN Take 3 mLs by nebulization every 6 (six) hours as needed. (Patient taking differently: Take 3 mLs by nebulization in the morning and at bedtime.)   Lancets (ONETOUCH ULTRASOFT) lancets Check blood sugars no more than twice daily   Multiple Vitamins-Minerals (MULTIVITAMINS THER. W/MINERALS) TABS tablet Take 1 tablet by mouth daily.   nitroGLYCERIN (NITROSTAT) 0.4 MG SL tablet Place 1 tablet (0.4 mg total) under the tongue every 5 (five) minutes x 3 doses as needed for chest pain.   ONETOUCH VERIO test strip USE TO CHECK BLOOD SUGAR NO MORE THAN TWICE A DAY   pantoprazole (PROTONIX) 40 MG tablet Take 1 tablet (40 mg total) by mouth daily before breakfast.   tamsulosin (FLOMAX) 0.4 MG CAPS capsule Take 1 capsule (0.4 mg total) by mouth daily after supper.                 Objective:   Physical Exam  Wts  11/04/2022        194  03/19/2021          183 09/18/2020  195  08/04/20 195 lb 9.6 oz (88.7 kg)  07/28/20 205 lb 4 oz (93.1 kg)  03/25/20 208 lb 2 oz (94.4 kg)  08/27/2011     217      Vital signs reviewed  11/04/2022  - Note at rest 02 sats  95% on 4lplm Pulsed   General appearance:    pleasant amb wm nad          HEENT : Oropharynx  clear/ edentulous      NECK :   without  apparent JVD/ palpable Nodes/TM    LUNGS: no acc muscle use,  Min barrel  contour chest wall with bilateral  slightly decreased bs s audible wheeze and  without cough on insp or exp maneuvers and min  Hyperresonant  to  percussion bilaterally    CV:  RRR  no s3 or murmur or increase in P2, and no edema   ABD:  soft and nontender with pos end  insp Hoover's  in the supine position.  No bruits or organomegaly appreciated   MS:  Nl gait/ ext warm without deformities Or obvious joint restrictions  calf tenderness, cyanosis or clubbing     SKIN: warm and dry without lesions    NEURO:  alert, approp, nl sensorium with  no motor or cerebellar deficits apparent.              I personally reviewed images and agree with radiology impression as follows:   Chest HRCT  10/14/22 1. No evidence of interstitial lung disease. 2. Chronic bandlike subpleural regions of consolidation in the lingula and basilar lower lobes compatible with postinfectious scarring. 3. New scattered mild patchy tree-in-bud nodules in the bilateral lower lobes, largest 5 mm in the superior segment right lower lobe, most compatible with a nonspecific mild infectious or inflammatory bronchiolitis, such as due to aspiration.   4. Chronic diffuse bronchial wall thickening, compatible with chronic bronchitis due to reported history of COPD. 5. Dilated main pulmonary artery, suggesting pulmonary arterial hypertension. Chronic patchy mosaic attenuation in both lungs is favored to represent mosaic perfusion from pulmonary vascular disease.     Assessment & Plan:

## 2022-11-04 ENCOUNTER — Encounter: Payer: Self-pay | Admitting: Internal Medicine

## 2022-11-04 ENCOUNTER — Ambulatory Visit (INDEPENDENT_AMBULATORY_CARE_PROVIDER_SITE_OTHER): Payer: Medicare Other | Admitting: Internal Medicine

## 2022-11-04 VITALS — BP 128/64 | HR 72 | Temp 98.5°F | Ht 69.0 in | Wt 194.4 lb

## 2022-11-04 DIAGNOSIS — J9612 Chronic respiratory failure with hypercapnia: Secondary | ICD-10-CM | POA: Diagnosis not present

## 2022-11-04 DIAGNOSIS — J9611 Chronic respiratory failure with hypoxia: Secondary | ICD-10-CM | POA: Diagnosis not present

## 2022-11-04 DIAGNOSIS — I2781 Cor pulmonale (chronic): Secondary | ICD-10-CM | POA: Diagnosis not present

## 2022-11-04 DIAGNOSIS — J449 Chronic obstructive pulmonary disease, unspecified: Secondary | ICD-10-CM

## 2022-11-04 DIAGNOSIS — I25118 Atherosclerotic heart disease of native coronary artery with other forms of angina pectoris: Secondary | ICD-10-CM | POA: Diagnosis not present

## 2022-11-04 NOTE — Patient Instructions (Signed)
If you are satisfied with your treatment plan,  let your doctor know and he/she can either refill your medications or you can return here when your prescription runs out or return hear yearly    If in any way you are not 100% satisfied,  please tell us.  If 100% better, tell your friends!  Pulmonary follow up is as needed

## 2022-11-05 NOTE — Assessment & Plan Note (Signed)
Quit smoking 1982 age 82  - PFT's 10/18/2014    FEV1 1.71 (59%) with ratio 68 and no sign change p B2 and DLCO 53% corrects to 85  - PFT's  09/18/2020  FEV1 1.41 (53 % ) ratio 0.75  p 6 % improvement from saba p 0 prior to study with DLCO  11.58 (50%) corrects to 4.71 (118%)  for alv volume and FV curve mildly concave   He's doing fine on an affordable combination of neb saba/sama/ ICS s flares or limits on desired activity.  If any worse could try on trelegy or breztri instead but he's content to to stay on present rx and f/u pulmonary can be prn

## 2022-11-05 NOTE — Assessment & Plan Note (Signed)
Started on 02 2lpm at discharge 09/23/11   - PFT's 10/28/2011  FEV1  1.40 (51%)  with ratio 70 and no better p B2 and DLCO 53 corrects to 101    - PFT's 10/18/2014    FEV1 1.71 (59%) with ratio 68 and no sign change p B2 and DLCO 53 corrects to 85  -  HC03   07/28/20  = 33   Advised: Make sure you check your oxygen saturation  AT  your highest level of activity (not after you stop)   to be sure it stays over 90% and adjust  02 flow upward to maintain this level if needed but remember to turn it back to previous settings when you stop (to conserve your supply).          Each maintenance medication was reviewed in detail including emphasizing most importantly the difference between maintenance and prns and under what circumstances the prns are to be triggered using an action plan format where appropriate.  Total time for H and P, chart review, counseling, reviewing neb/02 device(s) and generating customized AVS unique to this office visit / same day charting > 30 min final summary f/u ov

## 2022-11-05 NOTE — Assessment & Plan Note (Signed)
See echo 09/23/18  - Echo 09/03/20  1. Left ventricular ejection fraction, by estimation, is 55 to 60%. The left ventricle has normal function. The left ventricle has no regional wall motion abnormalities. There is mild left ventricular hypertrophy. Left ventricular diastolic parameters  are indeterminate.  2. Right ventricular systolic function is normal. The right ventricular size is moderately enlarged. There is moderately elevated pulmonary artery systolic pressure.  3. Left atrial size was mildly dilated.  4. Right atrial size was severely dilated.  5. The mitral valve is normal in structure. Trivial mitral valve regurgitation.  6. Tricuspid valve regurgitation is moderate.  7. The aortic valve is tricuspid. Aortic valve regurgitation is not visualized. Mild to moderate aortic valve sclerosis/calcification is present, without any evidence of aortic stenosis.  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

## 2022-11-08 ENCOUNTER — Other Ambulatory Visit: Payer: Self-pay | Admitting: Internal Medicine

## 2022-11-09 NOTE — Progress Notes (Unsigned)
Cardiology Office Note Date:  11/09/2022  Patient ID:  Bradley WENGER Sr., DOB 08-27-1941, MRN [id number] PCP:  Colon Branch, MD  Cardiologist:  Dr. Oval Linsey Electrophysiologist: Dr. Lovena Le  ***refresh   Chief Complaint: *** annual device visit/ERI  History of Present Illness: Bradley HERSHBERGER Sr. is a 82 y.o. male with history of AFib (s/p AVnode ablation) w/PPM, COPD, p.HTN (on home O2), DM, HTN, HLD, chronic CHF (diastolic)  He saw Dr. Lovena Le 07/16/22, pacer functioning well, nearing ERI, BP was good  Nov had COPD exacerbation and COVID  He saw Dr. Oval Linsey 09/13/22 noted recurrent hospitalizations with COPD/CHF exacerbations.  Most recently his coreg changed to bisoprolol apparently at an ER visit, also noted his Delene Loll had been stopped for unclear reasons.  He was walking for exercise., doing well. Discussed perhaps restarting Entresto in the future, no changes were made.  Device clinic note 09/30/22 with some NSVT and alert that he had reached RRT  Admitted 10/01/22 with acute/chronic respitarry failure, PNA, COPD.  He had abnormal Trops and cardiology consulted, LHC with no obstructive CAD, RHC as well with findings c/w p.HTN, TTE with preserved LVEF Discharged 10/07/22.  Saw cardiology team 10/19/22, volume stable, planned for labs, no changes were made.  He saw pulmonary 11/04/22, no changes made  *** RRT, procedure *** volume   Device information MDT dual chamber PPM implanted 09/08/2011   Past Medical History:  Diagnosis Date   Abnormal CT scan, chest 2012   CT chest, several lymphadenopathies. Sees pulmonary   Anemia    intermittent   Arthritis    "?back" (09/19/2018)   Atrial fibrillation (HCC)    s/p AV node ablation & BiV PPM implantation 09/08/11 (op dictation pending)   BPH (benign prostatic hyperplasia)    Saw Dr Reece Agar 2004, normal renal u/s   Bronchitis 08/26/2017   CAD (coronary artery disease)    CHF (congestive heart failure) ([place])    Thought  primarily to be non-systolic although EF down (EF 45-50% 12/2010, down to 35-40% 09/05/11), cath 2008 with no CAD, nuclear study 07/2011 showing Small area of reversibility in the distal ant/lat wall the left ventricle suspicious for ischemia/septal wall HK but felt to be low risk  (per D/C Summary 07/2011)   Chronic bronchitis ([place])    "get it ~ q yr"   Chronic diastolic heart failure ([place])         Chronic respiratory failure with hypoxia and hypercapnia ([place]) 09/14/2010   Followed in Pulmonary clinic/ [facility]/ [place]  Started on 02 2lpm at discharge 09/23/11   - PFT's 10/28/2011  FEV1  1.40 (51%)  with ratio 70 and no better p B2 and DLCO 53 corrects to 101    - PFT's 10/18/2014    FEV1 1.71 (59%) with ratio 68 and no sign change p B2 and DLCO 53 corrects to 85  -  HC03   07/28/20  = 33  -  HCO3   03/17/21     = 31     Heart murmur    HLD (hyperlipidemia)    HTN (hypertension)    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   Pleural effusion 2008   S/p decortication   Pneumonia 08/02/2011  Pulmonary HTN ([place])    per cath 2008   Type II diabetes mellitus ([place]) 1999    Past Surgical History:  Procedure Laterality Date   BI-VENTRICULAR PACEMAKER INSERTION Left 09/08/2011   Procedure: BI-VENTRICULAR PACEMAKER INSERTION (CRT-P);  Surgeon: Evans Lance, MD;  Location: [facility] CATH LAB;  Service: Cardiovascular;  Laterality: Left;   CARDIAC CATHETERIZATION     "couple times; never had balloon or stent" (09/19/2018)   CATARACT EXTRACTION W/ INTRAOCULAR LENS  IMPLANT, BILATERAL Bilateral 08/2018   COLONOSCOPY  03/10/11   normal   INSERT / REPLACE / REMOVE PACEMAKER  09/08/11   pacemaker placement   LUNG DECORTICATION     PLEURAL SCARIFICATION     pneumothorax with fibrothorax  ~ 2010   RIGHT/LEFT HEART CATH AND  CORONARY ANGIOGRAPHY N/A 10/05/2022   Procedure: RIGHT/LEFT HEART CATH AND CORONARY ANGIOGRAPHY;  Surgeon: Jettie Booze, MD;  Location: [place] CV LAB;  Service: Cardiovascular;  Laterality: N/A;   TONSILLECTOMY     "as a kid"     Current Outpatient Medications  Medication Sig Dispense Refill   acetaminophen (TYLENOL) 500 MG tablet Take 1,000 mg by mouth every 8 (eight) hours as needed for headache or moderate pain.     albuterol (VENTOLIN HFA) 108 (90 Base) MCG/ACT inhaler Inhale 2 puffs into the lungs every 6 (six) hours as needed for wheezing or shortness of breath. 8.5 g 0   amLODipine (NORVASC) 10 MG tablet Take 1 tablet (10 mg total) by mouth daily. (Patient taking differently: Take 10 mg by mouth in the morning.) 90 tablet 1   aspirin 81 MG tablet Take 81 mg by mouth every morning.      atorvastatin (LIPITOR) 40 MG tablet Take 1 tablet (40 mg total) by mouth at bedtime. 90 tablet 3   bisoprolol (ZEBETA) 10 MG tablet Take 1 tablet (10 mg total) by mouth daily. 90 tablet 1   budesonide (PULMICORT) 0.5 MG/2ML nebulizer solution Take 2 mLs (0.5 mg total) by nebulization 2 (two) times daily. 360 mL 1   calcium-vitamin D (OSCAL WITH D) 500-200 MG-UNIT tablet Take 1 tablet by mouth every morning.     cholecalciferol (VITAMIN D3) 25 MCG (1000 UNIT) tablet Take 1,000 Units by mouth every evening.     diphenhydramine-acetaminophen (TYLENOL PM) 25-500 MG TABS tablet Take 1 tablet by mouth at bedtime as needed (sleep).     FARXIGA 10 MG TABS tablet TAKE 1 TABLET DAILY BEFORE BREAKFAST (Patient taking differently: Take 10 mg by mouth in the morning.) 90 tablet 3   furosemide (LASIX) 40 MG tablet TAKE 2 TABLETS EVERY MORNING AND 1 TABLET LATE IN THE AFTERNOON (Patient taking differently: Take 40 mg by mouth in the morning, at noon, and at bedtime.) 270 tablet 3   guaiFENesin (MUCINEX) 600 MG 12 hr tablet Take 600 mg by mouth 2 (two) times daily. (Patient not taking: Reported on 11/04/2022)      ipratropium-albuterol (DUONEB) 0.5-2.5 (3) MG/3ML SOLN Take 3 mLs by nebulization every 6 (six) hours as needed. (Patient taking differently: Take 3 mLs by nebulization in the morning and at bedtime.) 360 mL 1   Lancets (ONETOUCH ULTRASOFT) lancets Check blood sugars no more than twice daily 200 each 12   Multiple Vitamins-Minerals (MULTIVITAMINS THER. W/MINERALS) TABS tablet Take 1 tablet by mouth daily. 30 each 5   nitroGLYCERIN (NITROSTAT) 0.4 MG SL tablet Place 1 tablet (0.4 mg total) under the tongue every 5 (five) minutes x 3 doses as needed  for chest pain. 25 tablet 3   ONETOUCH VERIO test strip USE TO CHECK BLOOD SUGAR NO MORE THAN TWICE A DAY 200 strip 12   pantoprazole (PROTONIX) 40 MG tablet Take 1 tablet (40 mg total) by mouth daily before breakfast. 90 tablet 0   tamsulosin (FLOMAX) 0.4 MG CAPS capsule Take 1 capsule (0.4 mg total) by mouth daily after supper. 90 capsule 1   No current facility-administered medications for this visit.    Allergies:   Hydrocodone, Ultram [tramadol], Cialis [tadalafil], and Avelox [moxifloxacin hydrochloride]   Social History:  The patient  reports that he quit smoking about 42 years ago. His smoking use included cigarettes and cigars. He has a 62.50 pack-year smoking history. He has never used smokeless tobacco. He reports that he does not currently use alcohol. He reports that he does not use drugs.   Family History:  The patient's family history includes Breast cancer in his maternal aunt; Coronary artery disease in his father; Diabetes in his father; Drug abuse in his son; Polycythemia in his mother.  ROS:  Please see the history of present illness.    All other systems are reviewed and otherwise negative.   PHYSICAL EXAM:  VS:  There were no vitals taken for this visit. BMI: There is no height or weight on file to calculate BMI. Well nourished, well developed, in no acute distress HEENT: normocephalic, atraumatic Neck: no JVD, carotid  bruits or masses Cardiac:  *** RRR; no significant murmurs, no rubs, or gallops Lungs:  *** CTA b/l, no wheezing, rhonchi or rales Abd: soft, nontender MS: no deformity or *** atrophy Ext: *** no edema Skin: warm and dry, no rash Neuro:  No gross deficits appreciated Psych: euthymic mood, full affect  *** PPM site is stable, no tethering or discomfort   EKG:  not done today  Device interrogation done today and reviewed by myself:  ***  10/05/22: R/LHC   The left ventricular systolic function is normal.   LV end diastolic pressure is normal.   The left ventricular ejection fraction is 50-55% by visual estimate.   Hemodynamic findings consistent with mild pulmonary hypertension.   There is no aortic valve stenosis.   Minimal, nonobstructive coronary artery disease.   Ao sat 99%, PA sat 68%, mean RA pressure 8 mmHg, PA pressure 43/17, mean PA pressure 29 mmHg, pulmonary capillary wedge pressure 13/15, mean pulmonary capillary wedge pressure 12 mmHg, cardiac output 4.60 L/min, cardiac index 2.39.   Tortuous right radial artery with radial loop.  This was straightened with an interventional wire and the patient tolerated this well.  We were able to complete the procedure from the right radial approach.  If cath was needed in the future, would consider alternative access.   Continue preventive therapy from cardiac standpoint.  Plan is for him to [facility] further pulmonary management.   10/03/22: TTE 1. Left ventricular ejection fraction, by estimation, is 55 to 60%. The  left ventricle has normal function. Left ventricular endocardial border  not optimally defined to evaluate regional wall motion. Left ventricular  diastolic parameters are  indeterminate.   2. Right ventricular systolic function is mildly reduced. The right  ventricular size is normal. There is moderately elevated pulmonary artery  systolic pressure. The estimated right ventricular systolic  pressure is  50.8 mmHg.   3. Left atrial size was mildly dilated.   4. Right atrial size was mildly dilated.   5. The mitral valve  is degenerative. Trivial mitral valve regurgitation.  No evidence of mitral stenosis.   6. The aortic valve is tricuspid. There is mild calcification of the  aortic valve. There is mild thickening of the aortic valve. Aortic valve  regurgitation is not visualized. No aortic stenosis is present.   7. The inferior vena cava is normal in size with <50% respiratory  variability, suggesting right atrial pressure of 8 mmHg.   Conclusion(s)/Recommendation(s): Consider limited echocardiogram with  Definity contrast to better assess regional wall motion if clinically  relevant.     Recent Labs: 10/01/2022: B Natriuretic Peptide 188.5; TSH 0.285 10/04/2022: Magnesium 2.5 10/06/2022: ALT 23 10/19/2022: BUN 12; Creatinine, Ser 0.83; Hemoglobin 13.9; Platelets 191; Potassium 4.9; Sodium 143  10/07/2022: Cholesterol 180; HDL 58; LDL Cholesterol 109; Total CHOL/HDL Ratio 3.1; Triglycerides 67; VLDL 13 10/19/2022: LDL Direct 108   CrCl cannot be calculated (Patient's most recent lab result is older than the maximum 21 days allowed.).   Wt Readings from Last 3 Encounters:  11/04/22 194 lb 6.4 oz (88.2 kg)  10/19/22 184 lb 9.6 oz (83.7 kg)  10/06/22 176 lb 9.4 oz (80.1 kg)     Other studies reviewed: Additional studies/records reviewed today include: summarized above  ASSESSMENT AND PLAN:  PPM RRT ***   Permanent AFib S/p AV node ablation Not on a/c, it seems 2/2 prior ICH  Chronic CHF (diastolic) Chronic respiratory failure (COPD/CHF/p.HTN) *** C/w Dr. Chaney Born  HTN ***   Disposition: F/u with ***  Current medicines are reviewed at length with the patient today.  The patient did not have any concerns regarding medicines.  Venetia Night, PA-C 11/09/2022 6:15 PM     [address] [place]  [phone] (office)  [phone] (fax)

## 2022-11-10 ENCOUNTER — Ambulatory Visit (HOSPITAL_COMMUNITY)
Admission: AD | Admit: 2022-11-10 | Discharge: 2022-11-10 | Disposition: A | Discharge status: Home / Self Care | Payer: Medicare Other | Source: Ambulatory Visit | Attending: Internal Medicine | Admitting: Internal Medicine

## 2022-11-10 ENCOUNTER — Encounter (HOSPITAL_COMMUNITY)
Admission: AD | Disposition: A | Discharge status: Home / Self Care | Payer: Self-pay | Source: Ambulatory Visit | Attending: Internal Medicine

## 2022-11-10 ENCOUNTER — Ambulatory Visit (INDEPENDENT_AMBULATORY_CARE_PROVIDER_SITE_OTHER): Payer: Medicare Other | Admitting: Physician Assistant

## 2022-11-10 ENCOUNTER — Other Ambulatory Visit: Payer: Self-pay

## 2022-11-10 ENCOUNTER — Encounter: Payer: Self-pay | Admitting: Physician Assistant

## 2022-11-10 VITALS — BP 130/80 | HR 83 | Ht 68.0 in | Wt 188.2 lb

## 2022-11-10 DIAGNOSIS — E785 Hyperlipidemia, unspecified: Secondary | ICD-10-CM | POA: Diagnosis not present

## 2022-11-10 DIAGNOSIS — I5032 Chronic diastolic (congestive) heart failure: Secondary | ICD-10-CM | POA: Insufficient documentation

## 2022-11-10 DIAGNOSIS — I442 Atrioventricular block, complete: Secondary | ICD-10-CM | POA: Diagnosis not present

## 2022-11-10 DIAGNOSIS — I482 Chronic atrial fibrillation, unspecified: Secondary | ICD-10-CM | POA: Insufficient documentation

## 2022-11-10 DIAGNOSIS — I10 Essential (primary) hypertension: Secondary | ICD-10-CM | POA: Insufficient documentation

## 2022-11-10 DIAGNOSIS — I25118 Atherosclerotic heart disease of native coronary artery with other forms of angina pectoris: Secondary | ICD-10-CM | POA: Diagnosis not present

## 2022-11-10 DIAGNOSIS — Z45010 Encounter for checking and testing of cardiac pacemaker pulse generator [battery]: Secondary | ICD-10-CM

## 2022-11-10 DIAGNOSIS — I495 Sick sinus syndrome: Secondary | ICD-10-CM

## 2022-11-10 DIAGNOSIS — E119 Type 2 diabetes mellitus without complications: Secondary | ICD-10-CM | POA: Insufficient documentation

## 2022-11-10 DIAGNOSIS — Z01818 Encounter for other preprocedural examination: Secondary | ICD-10-CM | POA: Insufficient documentation

## 2022-11-10 DIAGNOSIS — J9612 Chronic respiratory failure with hypercapnia: Secondary | ICD-10-CM | POA: Insufficient documentation

## 2022-11-10 DIAGNOSIS — J449 Chronic obstructive pulmonary disease, unspecified: Secondary | ICD-10-CM | POA: Insufficient documentation

## 2022-11-10 DIAGNOSIS — I4821 Permanent atrial fibrillation: Secondary | ICD-10-CM | POA: Insufficient documentation

## 2022-11-10 DIAGNOSIS — I272 Pulmonary hypertension, unspecified: Secondary | ICD-10-CM | POA: Insufficient documentation

## 2022-11-10 DIAGNOSIS — I110 Hypertensive heart disease with heart failure: Secondary | ICD-10-CM | POA: Diagnosis not present

## 2022-11-10 DIAGNOSIS — Z87891 Personal history of nicotine dependence: Secondary | ICD-10-CM | POA: Insufficient documentation

## 2022-11-10 DIAGNOSIS — Z9981 Dependence on supplemental oxygen: Secondary | ICD-10-CM | POA: Insufficient documentation

## 2022-11-10 HISTORY — PX: BIV PACEMAKER GENERATOR CHANGEOUT: EP1198

## 2022-11-10 LAB — BASIC METABOLIC PANEL
Anion gap: 7 (ref 5–15)
BUN: 9 mg/dL (ref 8–23)
CO2: 32 mmol/L (ref 22–32)
Calcium: 9 mg/dL (ref 8.9–10.3)
Chloride: 99 mmol/L (ref 98–111)
Creatinine, Ser: 0.74 mg/dL (ref 0.61–1.24)
GFR, Estimated: >60 mL/min (ref >60)
Glucose, Bld: 133 mg/dL — ABNORMAL HIGH (ref 70–99)
Potassium: 4 mmol/L (ref 3.5–5.1)
Sodium: 138 mmol/L (ref 135–145)

## 2022-11-10 LAB — CUP PACEART INCLINIC DEVICE CHECK
Date Time Interrogation Session: 20240131172433
Implantable Lead Connection Status: 753985
Implantable Lead Connection Status: 753985
Implantable Lead Implant Date: 20121128
Implantable Lead Implant Date: 20121128
Implantable Lead Location: 753858
Implantable Lead Location: 753860
Implantable Lead Model: 4194
Implantable Lead Model: 5076
Implantable Pulse Generator Implant Date: 20121128
Lead Channel Pacing Threshold Amplitude: 0.75 V
Lead Channel Pacing Threshold Amplitude: 1.25 V
Lead Channel Pacing Threshold Pulse Width: 0.4 ms
Lead Channel Pacing Threshold Pulse Width: 0.4 ms

## 2022-11-10 LAB — CBC
HCT: 44.7 % (ref 39.0–52.0)
Hemoglobin: 14.8 g/dL (ref 13.0–17.0)
MCH: 31.6 pg (ref 26.0–34.0)
MCHC: 33.1 g/dL (ref 30.0–36.0)
MCV: 95.3 fL (ref 80.0–100.0)
Platelets: 204 10*3/uL (ref 150–400)
RBC: 4.69 MIL/uL (ref 4.22–5.81)
RDW: 13.1 % (ref 11.5–15.5)
WBC: 5.6 10*3/uL (ref 4.0–10.5)
nRBC: 0 % (ref 0.0–0.2)

## 2022-11-10 LAB — GLUCOSE, CAPILLARY: Glucose-Capillary: 123 mg/dL — ABNORMAL HIGH (ref 70–99)

## 2022-11-10 SURGERY — BIV PACEMAKER GENERATOR CHANGEOUT

## 2022-11-10 MED ORDER — CEFAZOLIN SODIUM-DEXTROSE 2-4 GM/100ML-% IV SOLN
INTRAVENOUS | Status: AC
  Filled 2022-11-10: qty 100

## 2022-11-10 MED ORDER — SODIUM CHLORIDE 0.9 % IV SOLN
80.0000 mg | INTRAVENOUS | Status: AC
  Administered 2022-11-10: 80 mg

## 2022-11-10 MED ORDER — SODIUM CHLORIDE 0.9 % IV SOLN: INTRAVENOUS | Status: DC

## 2022-11-10 MED ORDER — LIDOCAINE HCL (PF) 1 % IJ SOLN
INTRAMUSCULAR | Status: DC | PRN
  Administered 2022-11-10: 60 mL

## 2022-11-10 MED ORDER — CEFAZOLIN SODIUM-DEXTROSE 2-4 GM/100ML-% IV SOLN
2.0000 g | INTRAVENOUS | Status: AC
  Administered 2022-11-10: 2 g via INTRAVENOUS

## 2022-11-10 MED ORDER — SODIUM CHLORIDE 0.9 % IV SOLN
INTRAVENOUS | Status: AC
  Filled 2022-11-10: qty 2

## 2022-11-10 MED ORDER — CHLORHEXIDINE GLUCONATE 4 % EX LIQD
4.0000 | Freq: Once | CUTANEOUS | Status: DC
  Filled 2022-11-10: qty 60

## 2022-11-10 MED ORDER — POVIDONE-IODINE 10 % EX SWAB
2.0000 | Freq: Once | CUTANEOUS | Status: AC
  Administered 2022-11-10: 2 via TOPICAL

## 2022-11-10 SURGICAL SUPPLY — 5 items
CABLE SURGICAL S-101-97-12 (CABLE) ×1
PAD DEFIB RADIO PHYSIO CONN (PAD) ×1
POUCH AIGIS-R ANTIBACT PPM (Mesh General) ×1 IMPLANT
PPM PRECEPTA MRI CRT-P W1TR01 (Pacemaker) ×1 IMPLANT
TRAY PACEMAKER INSERTION (PACKS) ×1

## 2022-11-10 NOTE — Patient Instructions (Addendum)
Medication Instructions:   Your physician recommends that you continue on your current medications as directed. Please refer to the Current Medication list given to you today.   *If you need a refill on your cardiac medications before your next appointment, please call your pharmacy*   Lab Work: BMET AND CBC TODAY    If you have labs (blood work) drawn today and your tests are completely normal, you will receive your results only by: [place] (if you have MyChart) OR A paper copy in the mail If you have any lab test that is abnormal or we need to change your treatment, we will call you to review the results.   Testing/Procedures:  YOU ARE SCHEDULED FOR DEVICE CHANGE TODAY PER DR TAYLOR    Follow-Up: At [facility], you and your health needs are our priority.  As part of our continuing mission to provide you with exceptional heart care, we have created designated Provider Care Teams.  These Care Teams include your primary Cardiologist (physician) and Advanced Practice Providers (APPs -  Physician Assistants and Nurse Practitioners) who all work together to provide you with the care you need, when you need it.  We recommend signing up for the patient portal called "MyChart".  Sign up information is provided on this After Visit Summary.  MyChart is used to connect with patients for Virtual Visits (Telemedicine).  Patients are able to view lab/test results, encounter notes, upcoming appointments, etc.  Non-urgent messages can be sent to your provider as well.   To learn more about what you can do with MyChart, go to [email or web address].    Your next appointment:  AFTER 11-10-22 10-14  DAYS DEVICE CHECK    3 month(s)  Provider:   You may see Dr. Lovena Le     Other Instructions

## 2022-11-10 NOTE — H&P (Signed)
Cardiology Office Note Date:  11/09/2022  Patient ID:  Bradley BEVILACQUA Sr., DOB [birth year], MRN [id number] PCP:  Colon Branch, MD           Cardiologist:  Dr. Oval Linsey Electrophysiologist: Dr. Lovena Le     Chief Complaint:  annual device visit/RRT   History of Present Illness: Bradley Jock Sr. is a 82 y.o. male with history of AFib (s/p AVnode ablation) w/PPM, COPD, p.HTN (on home O2), DM, HTN, HLD, chronic CHF (diastolic)   He saw Dr. Lovena Le 07/16/22, pacer functioning well, nearing ERI, BP was good   Nov had COPD exacerbation and COVID   He saw Dr. Oval Linsey 09/13/22 noted recurrent hospitalizations with COPD/CHF exacerbations.  Most recently his coreg changed to bisoprolol apparently at an ER visit, also noted his Bradley [name] had been stopped for unclear reasons.  He was walking for exercise., doing well. Discussed perhaps restarting Entresto in the future, no changes were made.   Device clinic note 09/30/22 with some NSVT and alert that he had reached RRT   Admitted 10/01/22 with acute/chronic respitarry failure, PNA, COPD.  He had abnormal Trops and cardiology consulted, LHC with no obstructive CAD, RHC as well with findings c/w p.HTN, TTE with preserved LVEF Discharged 10/07/22.   Saw cardiology team 10/19/22, volume stable, planned for labs, no changes were made.   He saw pulmonary 11/04/22, no changes made   TODAY He is accompanied by his wife. He feels like he is doing well Breathing is at his baseline Saw pulmonary and they have him on 4L now. No CP, palpitations No near syncope or syncope.     Device information MDT dual chamber PPM implanted 09/08/2011         Past Medical History:  Diagnosis Date   Abnormal CT scan, chest 2012    CT chest, several lymphadenopathies. Sees pulmonary   Anemia      intermittent   Arthritis      "?back" (09/19/2018)   Atrial fibrillation (HCC)      s/p AV node ablation & BiV PPM implantation 09/08/11 (op dictation pending)   BPH  (benign prostatic hyperplasia)      Saw Dr Bradley [name] 2004, normal renal u/s   Bronchitis 08/26/2017   CAD (coronary artery disease)     CHF (congestive heart failure) ([place])      Thought primarily to be non-systolic although EF down (EF 45-50% 12/2010, down to 35-40% 09/05/11), cath 2008 with no CAD, nuclear study 07/2011 showing Small area of reversibility in the distal ant/lat wall the left ventricle suspicious for ischemia/septal wall HK but felt to be low risk  (per D/C Summary 07/2011)   Chronic bronchitis ([place])      "get it ~ q yr"   Chronic diastolic heart failure ([place])           Chronic respiratory failure with hypoxia and hypercapnia ([place]) 09/14/2010    Followed in Pulmonary clinic/ [facility]/ [place]  Started on 02 2lpm at discharge 09/23/11   - PFT's 10/28/2011  FEV1  1.40 (51%)  with ratio 70 and no better p B2 and DLCO 53 corrects to 101    - PFT's 10/18/2014    FEV1 1.71 (59%) with ratio 68 and no sign change p B2 and DLCO 53 corrects to 85  -  HC03   07/28/20  = 33  -  HCO3   03/17/21     = 31     Heart murmur  HLD (hyperlipidemia)     HTN (hypertension)     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   Pleural effusion 2008    S/p decortication   Pneumonia 08/02/2011   Pulmonary HTN ([place])      per cath 2008   Type II diabetes mellitus ([place]) 1999           Past Surgical History:  Procedure Laterality Date   BI-VENTRICULAR PACEMAKER INSERTION Left 09/08/2011    Procedure: BI-VENTRICULAR PACEMAKER INSERTION (CRT-P);  Surgeon: Evans Lance, MD;  Location: [facility] CATH LAB;  Service: Cardiovascular;  Laterality: Left;   CARDIAC CATHETERIZATION        "couple times; never had balloon or stent" (09/19/2018)   CATARACT EXTRACTION W/ INTRAOCULAR LENS  IMPLANT, BILATERAL Bilateral 08/2018    COLONOSCOPY   03/10/11    normal   INSERT / REPLACE / REMOVE PACEMAKER   09/08/11    pacemaker placement   LUNG DECORTICATION       PLEURAL SCARIFICATION       pneumothorax with fibrothorax   ~ 2010   RIGHT/LEFT HEART CATH AND CORONARY ANGIOGRAPHY N/A 10/05/2022    Procedure: RIGHT/LEFT HEART CATH AND CORONARY ANGIOGRAPHY;  Surgeon: Bradley Booze, MD;  Location: [place] CV LAB;  Service: Cardiovascular;  Laterality: N/A;   TONSILLECTOMY        "as a kid"             Current Outpatient Medications  Medication Sig Dispense Refill   acetaminophen (TYLENOL) 500 MG tablet Take 1,000 mg by mouth every 8 (eight) hours as needed for headache or moderate pain.       albuterol (VENTOLIN HFA) 108 (90 Base) MCG/ACT inhaler Inhale 2 puffs into the lungs every 6 (six) hours as needed for wheezing or shortness of breath. 8.5 g 0   amLODipine (NORVASC) 10 MG tablet Take 1 tablet (10 mg total) by mouth daily. (Patient taking differently: Take 10 mg by mouth in the morning.) 90 tablet 1   aspirin 81 MG tablet Take 81 mg by mouth every morning.        atorvastatin (LIPITOR) 40 MG tablet Take 1 tablet (40 mg total) by mouth at bedtime. 90 tablet 3   bisoprolol (ZEBETA) 10 MG tablet Take 1 tablet (10 mg total) by mouth daily. 90 tablet 1   budesonide (PULMICORT) 0.5 MG/2ML nebulizer solution Take 2 mLs (0.5 mg total) by nebulization 2 (two) times daily. 360 mL 1   calcium-vitamin D (OSCAL WITH D) 500-200 MG-UNIT tablet Take 1 tablet by mouth every morning.       cholecalciferol (VITAMIN D3) 25 MCG (1000 UNIT) tablet Take 1,000 Units by mouth every evening.       diphenhydramine-acetaminophen (TYLENOL PM) 25-500 MG TABS tablet Take 1 tablet by mouth at bedtime as needed (sleep).       FARXIGA 10 MG TABS tablet TAKE 1 TABLET DAILY BEFORE BREAKFAST (Patient taking differently: Take 10 mg by mouth in the morning.) 90 tablet 3   furosemide (LASIX) 40 MG tablet TAKE 2 TABLETS EVERY MORNING AND 1 TABLET  LATE IN THE AFTERNOON (Patient taking differently: Take 40 mg by mouth in the morning, at  noon, and at bedtime.) 270 tablet 3   guaiFENesin (MUCINEX) 600 MG 12 hr tablet Take 600 mg by mouth 2 (two) times daily. (Patient not taking: Reported on 11/04/2022)       ipratropium-albuterol (DUONEB) 0.5-2.5 (3) MG/3ML SOLN Take 3 mLs by nebulization every 6 (six) hours as needed. (Patient taking differently: Take 3 mLs by nebulization in the morning and at bedtime.) 360 mL 1   Lancets (ONETOUCH ULTRASOFT) lancets Check blood sugars no more than twice daily 200 each 12   Multiple Vitamins-Minerals (MULTIVITAMINS THER. W/MINERALS) TABS tablet Take 1 tablet by mouth daily. 30 each 5   nitroGLYCERIN (NITROSTAT) 0.4 MG SL tablet Place 1 tablet (0.4 mg total) under the tongue every 5 (five) minutes x 3 doses as needed for chest pain. 25 tablet 3   ONETOUCH VERIO test strip USE TO CHECK BLOOD SUGAR NO MORE THAN TWICE A DAY 200 strip 12   pantoprazole (PROTONIX) 40 MG tablet Take 1 tablet (40 mg total) by mouth daily before breakfast. 90 tablet 0   tamsulosin (FLOMAX) 0.4 MG CAPS capsule Take 1 capsule (0.4 mg total) by mouth daily after supper. 90 capsule 1    No current facility-administered medications for this visit.      Allergies:   Hydrocodone, Ultram [tramadol], Cialis [tadalafil], and Avelox [moxifloxacin hydrochloride]    Social History:  The patient  reports that he quit smoking about 42 years ago. His smoking use included cigarettes and cigars. He has a 62.50 pack-year smoking history. He has never used smokeless tobacco. He reports that he does not currently use alcohol. He reports that he does not use drugs.    Family History:  The patient's family history includes Breast cancer in his maternal aunt; Coronary artery disease in his father; Diabetes in his father; Drug abuse in his son; Polycythemia in his mother.   ROS:  Please see the history of present illness.    All other systems are reviewed  and otherwise negative.    PHYSICAL EXAM:  VS:  There were no vitals taken for this visit. BMI: There is no height or weight on file to calculate BMI. Well nourished, well developed, in no acute distress, looks older then his age 78: normocephalic, atraumatic Neck: no JVD, carotid bruits or masses Cardiac:   RRR; (paced) no significant murmurs, no rubs, or gallops Lungs:  CTA b/l, no wheezing, rhonchi or rales Abd: soft, nontender MS: no deformity, advanced atrophy atrophy Ext: no edema Skin: warm and dry, no rash Neuro:  No gross deficits appreciated Psych: euthymic mood, full affect   PPM site is stable, no tethering or discomfort     EKG:  done today and reviewed by myself AFib, V paced 83   Device interrogation done today and reviewed by myself:  Battery reached ERI 08/28/22 Lead measurements are stable No R waves, he is dependent   10/05/22: R/LHC   The left ventricular systolic function is normal.   LV end diastolic pressure is normal.   The left ventricular ejection fraction is 50-55% by visual estimate.   Hemodynamic findings consistent with mild pulmonary hypertension.   There is no aortic valve stenosis.   Minimal, nonobstructive coronary artery disease.   Ao sat 99%, PA sat 68%, mean RA pressure 8 mmHg, PA pressure 43/17, mean PA pressure 29 mmHg, pulmonary capillary wedge pressure 13/15, mean pulmonary capillary wedge pressure 12 mmHg, cardiac output 4.60 L/min, cardiac index 2.39.   Tortuous right radial artery with radial loop.  This was straightened with an interventional wire and the patient tolerated this well.  We were able to complete the procedure from the right radial approach.  If cath was needed in the future, would consider alternative access.   Continue preventive therapy from cardiac standpoint.  Plan is for him to go back to [facility] for further pulmonary management.     10/03/22: TTE 1. Left ventricular ejection fraction, by  estimation, is 55 to 60%. The  left ventricle has normal function. Left ventricular endocardial border  not optimally defined to evaluate regional wall motion. Left ventricular  diastolic parameters are  indeterminate.   2. Right ventricular systolic function is mildly reduced. The right  ventricular size is normal. There is moderately elevated pulmonary artery  systolic pressure. The estimated right ventricular systolic pressure is  73.2 mmHg.   3. Left atrial size was mildly dilated.   4. Right atrial size was mildly dilated.   5. The mitral valve is degenerative. Trivial mitral valve regurgitation.  No evidence of mitral stenosis.   6. The aortic valve is tricuspid. There is mild calcification of the  aortic valve. There is mild thickening of the aortic valve. Aortic valve  regurgitation is not visualized. No aortic stenosis is present.   7. The inferior vena cava is normal in size with <50% respiratory  variability, suggesting right atrial pressure of 8 mmHg.   Conclusion(s)/Recommendation(s): Consider limited echocardiogram with  Definity contrast to better assess regional wall motion if clinically  relevant.        Recent Labs: 10/01/2022: B Natriuretic Peptide 188.5; TSH 0.285 10/04/2022: Magnesium 2.5 10/06/2022: ALT 23 10/19/2022: BUN 12; Creatinine, Ser 0.83; Hemoglobin 13.9; Platelets 191; Potassium 4.9; Sodium 143  10/07/2022: Cholesterol 180; HDL 58; LDL Cholesterol 109; Total CHOL/HDL Ratio 3.1; Triglycerides 67; VLDL 13 10/19/2022: LDL Direct 108    CrCl cannot be calculated (Patient's most recent lab result is older than the maximum 21 days allowed.).       Wt Readings from Last 3 Encounters:  11/04/22 194 lb 6.4 oz (88.2 kg)  10/19/22 184 lb 9.6 oz (83.7 kg)  10/06/22 176 lb 9.4 oz (80.1 kg)      Other studies reviewed: Additional studies/records reviewed today include: summarized above   ASSESSMENT AND PLAN:   PPM RRT on 08/28/22 He is dependent  D/w  Dr. Lovena Le Plan gen change today They will head over now     Permanent AFib S/p AV node ablation Not on a/c, it seems 2/2 prior ICH     Chronic CHF (diastolic) Chronic respiratory failure (COPD/CHF/p.HTN) OptiVol is way up though his exam doesn't support volume, he has had a couple recent hospitalizations with COPD/CHF, though appears fairly well compensated currently C/w Dr. Chaney Born     HTN Looks ok     Disposition: F/u with usual post procedure follow up   Current medicines are reviewed at length with the patient today.  The patient did not have any concerns regarding medicines.   Venetia Night, PA-C 11/09/2022 6:15 PM     [address] [place] [address] [phone] (office)  [phone] (fax)      EP Attending  Patient seen and examined. Patient well known to me with uncontrolled atrial fib s/p AV node ablation and Biv PPM insertion. He has reached ERI. I have discussed the treatment options with the patient and recommended proceeding with PPM gen change out. He is dependent with no  escape today.  Sharlot Gowda Thaddus Mcdowell,MD

## 2022-11-10 NOTE — Discharge Instructions (Signed)
Implantable Cardiac Device Battery Change, Care After  This sheet gives you information about how to care for yourself after your procedure. Your health care provider may also give you more specific instructions. If you have problems or questions, contact your health care provider. What can I expect after the procedure? After your procedure, it is common to have:  Pain or soreness at the site where the cardiac device was inserted.  Swelling at the site where the cardiac device was inserted.  You should received an information card for your new device in 4-8 weeks. Follow these instructions at home: Incision care   Keep the incision clean and dry. ? Do not take baths, swim, or use a hot tub until after your wound check.  ? Do not shower for at least 7 days, or as directed by your health care provider. ? Pat the area dry with a clean towel. Do not rub the area. This may cause bleeding.  Follow instructions from your health care provider about how to take care of your incision. Make sure you: ? Leave stitches (sutures), skin glue, or adhesive strips in place. These skin closures may need to stay in place for 2 weeks or longer. If adhesive strip edges start to loosen and curl up, you may trim the loose edges. Do not remove adhesive strips completely unless your health care provider tells you to do that.  Check your incision area every day for signs of infection. Check for: ? More redness, swelling, or pain. ? More fluid or blood. ? Warmth. ? Pus or a bad smell. Activity  Do not lift anything that is heavier than 10 lb (4.5 kg) until your health care provider says it is okay to do so.  For the first week, or as long as told by your health care provider: ? Avoid lifting your affected arm higher than your shoulder. ? After 1 week, Be gentle when you move your arms over your head. It is okay to raise your arm to comb your hair. ? Avoid strenuous exercise.  Ask your health care provider  when it is okay to: ? Resume your normal activities. ? Return to work or school. ? Resume sexual activity. Eating and drinking  Eat a heart-healthy diet. This should include plenty of fresh fruits and vegetables, whole grains, low-fat dairy products, and lean protein like chicken and fish.  Limit alcohol intake to no more than 1 drink a day for non-pregnant women and 2 drinks a day for men. One drink equals 12 oz of beer, 5 oz of wine, or 1 oz of hard liquor.  Check ingredients and nutrition facts on packaged foods and beverages. Avoid the following types of food: ? Food that is high in salt (sodium). ? Food that is high in saturated fat, like full-fat dairy or red meat. ? Food that is high in trans fat, like fried food. ? Food and drinks that are high in sugar. Lifestyle  Do not use any products that contain nicotine or tobacco, such as cigarettes and e-cigarettes. If you need help quitting, ask your health care provider.  Take steps to manage and control your weight.  Once cleared, get regular exercise. Aim for 150 minutes of moderate-intensity exercise (such as walking or yoga) or 75 minutes of vigorous exercise (such as running or swimming) each week.  Manage other health problems, such as diabetes or high blood pressure. Ask your health care provider how you can manage these conditions. General instructions  Do   not drive for 24 hours after your procedure if you were given a medicine to help you relax (sedative).  Take over-the-counter and prescription medicines only as told by your health care provider.  Avoid putting pressure on the area where the cardiac device was placed.  If you need an MRI after your cardiac device has been placed, be sure to tell the health care provider who orders the MRI that you have a cardiac device.  Avoid close and prolonged exposure to electrical devices that have strong magnetic fields. These include: ? Cell phones. Avoid keeping them in a  pocket near the cardiac device, and try using the ear opposite the cardiac device. ? MP3 players. ? Household appliances, like microwaves. ? Metal detectors. ? Electric generators. ? High-tension wires.  Keep all follow-up visits as directed by your health care provider. This is important. Contact a health care provider if:  You have pain at the incision site that is not relieved by over-the-counter or prescription medicines.  You have any of these around your incision site or coming from it: ? More redness, swelling, or pain. ? Fluid or blood. ? Warmth to the touch. ? Pus or a bad smell.  You have a fever.  You feel brief, occasional palpitations, light-headedness, or any symptoms that you think might be related to your heart. Get help right away if:  You experience chest pain that is different from the pain at the cardiac device site.  You develop a red streak that extends above or below the incision site.  You experience shortness of breath.  You have palpitations or an irregular heartbeat.  You have light-headedness that does not go away quickly.  You faint or have dizzy spells.  Your pulse suddenly drops or increases rapidly and does not return to normal.  You begin to gain weight and your legs and ankles swell. Summary  After your procedure, it is common to have pain, soreness, and some swelling where the cardiac device was inserted.  Make sure to keep your incision clean and dry. Follow instructions from your health care provider about how to take care of your incision.  Check your incision every day for signs of infection, such as more pain or swelling, pus or a bad smell, warmth, or leaking fluid and blood.  Avoid strenuous exercise and lifting your left arm higher than your shoulder for 2 weeks, or as long as told by your health care provider. This information is not intended to replace advice given to you by your health care provider. Make sure you discuss  any questions you have with your health care provider.   

## 2022-11-10 NOTE — Progress Notes (Signed)
Patient stated that he ate and drink at 1100 today, took his aspirin at 0600 today and he has to drive home because his wife can't drive. Lovena Le, MD notified of this information and stated that it was okay per LB, RN (since MD was in procedure).

## 2022-11-11 ENCOUNTER — Encounter (HOSPITAL_COMMUNITY): Payer: Self-pay | Admitting: Internal Medicine

## 2022-11-15 ENCOUNTER — Encounter: Payer: Self-pay | Admitting: Internal Medicine

## 2022-11-15 ENCOUNTER — Ambulatory Visit (INDEPENDENT_AMBULATORY_CARE_PROVIDER_SITE_OTHER): Payer: Medicare Other | Admitting: Internal Medicine

## 2022-11-15 VITALS — BP 134/72 | HR 91 | Temp 98.0°F | Resp 18 | Ht 68.0 in | Wt 191.2 lb

## 2022-11-15 DIAGNOSIS — Z23 Encounter for immunization: Secondary | ICD-10-CM

## 2022-11-15 DIAGNOSIS — E1159 Type 2 diabetes mellitus with other circulatory complications: Secondary | ICD-10-CM

## 2022-11-15 DIAGNOSIS — J449 Chronic obstructive pulmonary disease, unspecified: Secondary | ICD-10-CM

## 2022-11-15 DIAGNOSIS — I25118 Atherosclerotic heart disease of native coronary artery with other forms of angina pectoris: Secondary | ICD-10-CM

## 2022-11-15 LAB — CBC
Hematocrit: 41.8 % (ref 37.5–51.0)
Hemoglobin: 14.6 g/dL (ref 13.0–17.7)
MCH: 31.7 pg (ref 26.6–33.0)
MCHC: 34.9 g/dL (ref 31.5–35.7)
MCV: 91 fL (ref 79–97)
Platelets: 207 10*3/uL (ref 150–450)
RBC: 4.61 x10E6/uL (ref 4.14–5.80)
RDW: 12 % (ref 11.6–15.4)
WBC: 7.3 10*3/uL (ref 3.4–10.8)

## 2022-11-15 LAB — BASIC METABOLIC PANEL
BUN/Creatinine Ratio: 16 (ref 10–24)
BUN: 13 mg/dL (ref 8–27)
CO2: 28 mmol/L (ref 20–29)
Calcium: 9.6 mg/dL (ref 8.6–10.2)
Chloride: 101 mmol/L (ref 96–106)
Creatinine, Ser: 0.79 mg/dL (ref 0.76–1.27)
Glucose: 137 mg/dL — ABNORMAL HIGH (ref 70–99)
Potassium: 4.6 mmol/L (ref 3.5–5.2)
Sodium: 143 mmol/L (ref 134–144)
eGFR: 89 mL/min/{1.73_m2} (ref >59)

## 2022-11-15 LAB — LDL CHOLESTEROL, DIRECT: LDL Direct: 76 mg/dL (ref 0–99)

## 2022-11-15 NOTE — Assessment & Plan Note (Signed)
DM: Last A1c very good, continue [place] Hyperlipidemia: Last LDL 109, used to be better, continue atorvastatin and watch diet closely. Cardiovascular: Admitted to hospital January 31, had an ICD exchange.  Reports no problems at this time. COPD Small pulmonary 11/04/2022 He's doing fine on an affordable combination of neb saba/sama/ ICS s flares or limits on desired activity. If any worse could try on trelegy or breztri instead but he's content to to stay on present rx and f/u pulmonary can be prn  On oxygen supplements, O2 sat today is 92%, home is typically in the 90s.  No change. Suppressed TSH: Noted last month in the context of the patient being in the hospital.  Recheck on RTC. Preventive care reviewed. RTC 3 months.

## 2022-11-15 NOTE — Progress Notes (Signed)
Subjective:    Patient ID: Bradley [name]., male    DOB: [birth year], 82 y.o.   MRN: [id number]  DOS:  11/15/2022 Type of visit - description: Follow-up  Since the last office visit is doing well and has no major concerns.  Review of Systems See above   Past Medical History:  Diagnosis Date   Abnormal CT scan, chest 2012   CT chest, several lymphadenopathies. Sees pulmonary   Anemia    intermittent   Arthritis    "?back" (09/19/2018)   Atrial fibrillation (HCC)    s/p AV node ablation & BiV PPM implantation 09/08/11 (op dictation pending)   BPH (benign prostatic hyperplasia)    Saw Dr Reece Agar 2004, normal renal u/s   Bronchitis 08/26/2017   CAD (coronary artery disease)    CHF (congestive heart failure) ([place])    Thought primarily to be non-systolic although EF down (EF 45-50% 12/2010, down to 35-40% 09/05/11), cath 2008 with no CAD, nuclear study 07/2011 showing Small area of reversibility in the distal ant/lat wall the left ventricle suspicious for ischemia/septal wall HK but felt to be low risk  (per D/C Summary 07/2011)   Chronic bronchitis ([place])    "get it ~ q yr"   Chronic diastolic heart failure ([place])         Chronic respiratory failure with hypoxia and hypercapnia ([place]) 09/14/2010   Followed in Pulmonary clinic/  [facility]/ [facility]  Started on 02 2lpm at discharge 09/23/11   - PFT's 10/28/2011  FEV1  1.40 (51%)  with ratio 70 and no better p B2 and DLCO 53 corrects to 101    - PFT's 10/18/2014    FEV1 1.71 (59%) with ratio 68 and no sign change p B2 and DLCO 53 corrects to 85  -  HC03   07/28/20  = 33  -  HCO3   03/17/21     = 31     Heart murmur    HLD (hyperlipidemia)    HTN (hypertension)    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   Pleural effusion 2008   S/p decortication    Pneumonia 08/02/2011   Pulmonary HTN ([place])    per cath 2008   Type II diabetes mellitus ([place]) 1999    Past Surgical History:  Procedure Laterality Date   BI-VENTRICULAR PACEMAKER INSERTION Left 09/08/2011   Procedure: BI-VENTRICULAR PACEMAKER INSERTION (CRT-P);  Surgeon: Evans Lance, MD;  Location: [facility] CATH LAB;  Service: Cardiovascular;  Laterality: Left;   BIV PACEMAKER GENERATOR CHANGEOUT N/A 11/10/2022   Procedure: BIV PACEMAKER GENERATOR CHANGEOUT;  Surgeon: Evans Lance, MD;  Location: [facility] CV LAB;  Service: Cardiovascular;  Laterality: N/A;   CARDIAC CATHETERIZATION     "couple times; never had balloon or stent" (09/19/2018)   CATARACT EXTRACTION W/ INTRAOCULAR LENS  IMPLANT, BILATERAL Bilateral 08/2018   COLONOSCOPY  03/10/11   normal   INSERT / REPLACE / REMOVE PACEMAKER  09/08/11   pacemaker placement   LUNG DECORTICATION     PLEURAL SCARIFICATION     pneumothorax with fibrothorax  ~ 2010   RIGHT/LEFT HEART CATH AND CORONARY ANGIOGRAPHY N/A 10/05/2022   Procedure: RIGHT/LEFT HEART CATH AND CORONARY ANGIOGRAPHY;  Surgeon: Jettie Booze, MD;  Location: [facility] CV LAB;  Service: Cardiovascular;  Laterality: N/A;   TONSILLECTOMY     "as a kid"     Current Outpatient Medications  Medication Instructions   acetaminophen (TYLENOL) 1,000 mg, Oral, Every 8 hours PRN   albuterol (VENTOLIN HFA) 108 (90 Base) MCG/ACT inhaler 2 puffs, Inhalation, Every 6 hours PRN   amLODipine (NORVASC) 10 mg, Oral, Daily   aspirin 81 mg, Oral, Every morning   atorvastatin (LIPITOR) 40 mg, Oral, Daily at bedtime   bisoprolol (ZEBETA) 10 mg, Oral, Daily   budesonide (PULMICORT) 0.5 mg, Nebulization, 2 times daily   calcium-vitamin D (OSCAL WITH D) 500-200 MG-UNIT tablet 1 tablet, Oral, Every morning   cholecalciferol (VITAMIN D3) 1,000 Units, Oral, Every evening   diphenhydramine-acetaminophen (TYLENOL PM) 25-500 MG TABS tablet 1 tablet, Oral, At bedtime PRN   FARXIGA 10 MG  TABS tablet TAKE 1 TABLET DAILY BEFORE BREAKFAST   furosemide (LASIX) 40 MG tablet TAKE 2 TABLETS EVERY MORNING AND 1 TABLET LATE IN THE AFTERNOON   guaiFENesin (MUCINEX) 600 mg, Oral, 2 times daily   ipratropium-albuterol (DUONEB) 0.5-2.5 (3) MG/3ML SOLN 3 mLs, Nebulization, Every 6 hours PRN   Lancets (ONETOUCH ULTRASOFT) lancets Check blood sugars no more than twice daily   Multiple Vitamins-Minerals (MULTIVITAMINS THER. W/MINERALS) TABS tablet 1 tablet, Oral, Daily   nitroGLYCERIN (NITROSTAT) 0.4 mg, Sublingual, Every 5 min x3 PRN   ONETOUCH VERIO test strip USE TO CHECK BLOOD SUGAR NO MORE THAN TWICE A DAY   OXYGEN 4 L/min, Inhalation, Continuous   pantoprazole (PROTONIX) 40 mg, Oral, Daily before breakfast   tamsulosin (FLOMAX) 0.4 mg, Oral, Daily after supper       Objective:   Physical Exam BP 134/72   Pulse 91   Temp 98 F (36.7 C) (Oral)   Resp 18   Ht 5\' 8"  (1.727 m)   Wt 191 lb 4 oz (86.8 kg)   SpO2 (!) 89% Comment: 4L/min  BMI 29.08 kg/m  General:   Well developed, NAD, BMI noted. HEENT:  Normocephalic . Face symmetric, atraumatic Lungs:  decreased breath sounds but clear Normal respiratory effort, no intercostal retractions, no accessory muscle use. Heart: Seems regular Lower extremities: no pretibial edema bilaterally  Skin: Not pale. Not jaundice Neurologic:  alert & oriented X3.  Speech normal, gait appropriate for age and unassisted Psych--  Cognition and judgment appear intact.  Cooperative with normal attention span and concentration.  Behavior appropriate. No anxious or depressed appearing.      Assessment     Assessment  DM HTN Hyperlipidemia GERD Anxiety- insomnia  BPH Cardiovascular:Dr Lovena Le  --  Cath x 2 [id number]: no significant CAD -- CHF, non-ischemic  --Atrial fibrillation; h/o AV node ablation, has a Pacemaker (biventricular pacing) --h/o IC bleeding: Not anticoagulated --Peripheral vascular disease Pulmonary: Dr Melvyn Novas, last  OV 10-18-2014, f/u prn --COPD, severe, chronic resp failure, night O2 prn, has portable O2  --Pleural effusion s/p decortication --Pulmonary hypertension  Stasis dermatitis   PLAN: DM: Last A1c very good, continue [place] Hyperlipidemia: Last LDL 109, used to be better, continue atorvastatin and watch diet closely. Cardiovascular: Admitted to hospital January 31, had an ICD exchange.  Reports no problems at this time. COPD Small pulmonary 11/04/2022 He's doing fine on an affordable combination of neb saba/sama/ ICS s flares or limits on desired activity. If any worse could try on trelegy or breztri instead but he's content to to stay on present rx and f/u pulmonary can be prn  On oxygen supplements, O2 sat today is 92%, home is typically in the 90s.  No change. Suppressed TSH: Noted last month in the context of the patient being in the hospital.  Recheck on RTC. Preventive care reviewed. RTC 3 months.

## 2022-11-15 NOTE — Assessment & Plan Note (Signed)
Tdap 09-2013 PNM 23: Last 04-2019 PNM 13: 2016 PNM 20: 11/15/2022 S/p Zostavax and shingrix RSV: rec Covid vax: rec booster  See previous notes, no further colon/prostate ca i screenings Healthcare POA: See AVS.

## 2022-11-15 NOTE — Patient Instructions (Addendum)
Vaccines I recommend:  Covid booster RSV vaccine  Please bring [place] a copy of your Healthcare Power of Attorney for your chart.    GO TO THE FRONT DESK, PLEASE SCHEDULE YOUR APPOINTMENTS Come back for a checkup in 3 months

## 2022-11-16 LAB — HEPATIC FUNCTION PANEL
ALT: 8 IU/L (ref 0–44)
AST: 15 IU/L (ref 0–40)
Albumin: 4.2 g/dL (ref 3.7–4.7)
Alkaline Phosphatase: 99 IU/L (ref 44–121)
Bilirubin Total: 0.6 mg/dL (ref 0.0–1.2)
Bilirubin, Direct: 0.17 mg/dL (ref 0.00–0.40)
Total Protein: 6.6 g/dL (ref 6.0–8.5)

## 2022-11-16 LAB — LIPID PANEL
Chol/HDL Ratio: 2.6 ratio (ref 0.0–5.0)
Cholesterol, Total: 142 mg/dL (ref 100–199)
HDL: 54 mg/dL (ref >39)
LDL Chol Calc (NIH): 73 mg/dL (ref 0–99)
Triglycerides: 76 mg/dL (ref 0–149)
VLDL Cholesterol Cal: 15 mg/dL (ref 5–40)

## 2022-11-21 NOTE — Progress Notes (Signed)
Cardiology Office Note Date:  11/21/2022  Patient ID:  Bradley Cavaco., DOB [birth year], MRN [id number] PCP:  Bradley Branch, MD  Cardiologist:  Skeet Latch, MD Electrophysiologist: Cristopher Peru, MD  ***refresh   Chief Complaint: ***  History of Present Illness: Bradley Jock Sr. is a 82 y.o. male with PMH notable for perm Afib (s/p AVN ablation), PPM, COPD on home Os, T2DM, HTN, HLD, diastolic HF; seen today for Cristopher Peru, MD for wound check after PPM gen change 11/10/22.    Patient reports ***.  he denies chest pain, palpitations, dyspnea, PND, orthopnea, nausea, vomiting, dizziness, syncope, edema, weight gain, or early satiety.     Device Information: ***  AAD History: ***  Past Medical History:  Diagnosis Date   Abnormal CT scan, chest 2012   CT chest, several lymphadenopathies. Sees pulmonary   Anemia    intermittent   Arthritis    "?back" (09/19/2018)   Atrial fibrillation (HCC)    s/p AV node ablation & BiV PPM implantation 09/08/11 (op dictation pending)   BPH (benign prostatic hyperplasia)    Saw Dr Reece Agar 2004, normal renal u/s   Bronchitis 08/26/2017   CAD (coronary artery disease)    CHF (congestive heart failure) ([place])    Thought primarily to be non-systolic although EF down (EF 45-50% 12/2010, down to 35-40% 09/05/11), cath 2008 with no CAD, nuclear study 07/2011 showing Small area of reversibility in the distal ant/lat wall the left ventricle suspicious for ischemia/septal wall HK but felt to be low risk  (per D/C Summary 07/2011)   Chronic bronchitis ([place])    "get it ~ q yr"   Chronic diastolic heart failure ([place])         Chronic respiratory failure with hypoxia and hypercapnia ([place]) 09/14/2010   Followed in Pulmonary clinic/ [facility]/ [facility]  Started on 02 2lpm at discharge 09/23/11   - PFT's 10/28/2011  FEV1  1.40 (51%)  with ratio 70 and no better p B2 and DLCO 53 corrects to 101    - PFT's 10/18/2014    FEV1 1.71 (59%) with ratio 68 and no  sign change p B2 and DLCO 53 corrects to 85  -  HC03   07/28/20  = 33  -  HCO3   03/17/21     = 31     Heart murmur    HLD (hyperlipidemia)    HTN (hypertension)    ICB (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   Pleural effusion 2008   S/p decortication   Pneumonia 08/02/2011   Pulmonary HTN ([place])    per cath 2008   Type II diabetes mellitus ([place]) 1999    Past Surgical History:  Procedure Laterality Date   BI-VENTRICULAR PACEMAKER INSERTION Left 09/08/2011   Procedure: BI-VENTRICULAR PACEMAKER INSERTION (CRT-P);  Surgeon: Evans Lance, MD;  Location: [facility] CATH LAB;  Service: Cardiovascular;  Laterality: Left;   BIV PACEMAKER GENERATOR CHANGEOUT N/A 11/10/2022   Procedure: BIV PACEMAKER GENERATOR CHANGEOUT;  Surgeon: Evans Lance, MD;  Location: [facility] CV LAB;  Service: Cardiovascular;  Laterality: N/A;   CARDIAC CATHETERIZATION     "couple times; never had balloon or stent" (09/19/2018)   CATARACT EXTRACTION W/ INTRAOCULAR LENS  IMPLANT,  BILATERAL Bilateral 08/2018   COLONOSCOPY  03/10/11   normal   INSERT / REPLACE / REMOVE PACEMAKER  09/08/11   pacemaker placement   LUNG DECORTICATION     PLEURAL SCARIFICATION     pneumothorax with fibrothorax  ~ 2010   RIGHT/LEFT HEART CATH AND CORONARY ANGIOGRAPHY N/A 10/05/2022   Procedure: RIGHT/LEFT HEART CATH AND CORONARY ANGIOGRAPHY;  Surgeon: Jettie Booze, MD;  Location: [facility] CV LAB;  Service: Cardiovascular;  Laterality: N/A;   TONSILLECTOMY     "as a kid"     Current Outpatient Medications  Medication Instructions   acetaminophen (TYLENOL) 1,000 mg, Oral, Every 8 hours PRN   albuterol (VENTOLIN HFA) 108 (90 Base) MCG/ACT inhaler 2 puffs, Inhalation, Every 6 hours PRN   amLODipine (NORVASC) 10 mg, Oral, Daily   aspirin 81 mg, Oral,  Every morning   atorvastatin (LIPITOR) 40 mg, Oral, Daily at bedtime   bisoprolol (ZEBETA) 10 mg, Oral, Daily   budesonide (PULMICORT) 0.5 mg, Nebulization, 2 times daily   calcium-vitamin D (OSCAL WITH D) 500-200 MG-UNIT tablet 1 tablet, Oral, Every morning   cholecalciferol (VITAMIN D3) 1,000 Units, Oral, Every evening   diphenhydramine-acetaminophen (TYLENOL PM) 25-500 MG TABS tablet 1 tablet, Oral, At bedtime PRN   FARXIGA 10 MG TABS tablet TAKE 1 TABLET DAILY BEFORE BREAKFAST   furosemide (LASIX) 40 MG tablet TAKE 2 TABLETS EVERY MORNING AND 1 TABLET LATE IN THE AFTERNOON   guaiFENesin (MUCINEX) 600 mg, Oral, 2 times daily   ipratropium-albuterol (DUONEB) 0.5-2.5 (3) MG/3ML SOLN 3 mLs, Nebulization, Every 6 hours PRN   Lancets (ONETOUCH ULTRASOFT) lancets Check blood sugars no more than twice daily   Multiple Vitamins-Minerals (MULTIVITAMINS THER. W/MINERALS) TABS tablet 1 tablet, Oral, Daily   nitroGLYCERIN (NITROSTAT) 0.4 mg, Sublingual, Every 5 min x3 PRN   ONETOUCH VERIO test strip USE TO CHECK BLOOD SUGAR NO MORE THAN TWICE A DAY   OXYGEN 4 L/min, Inhalation, Continuous   pantoprazole (PROTONIX) 40 mg, Oral, Daily before breakfast   tamsulosin (FLOMAX) 0.4 mg, Oral, Daily after supper      Social History:  The patient  reports that he quit smoking about 42 years ago. His smoking use included cigarettes and cigars. He has a 62.50 pack-year smoking history. He has never used smokeless tobacco. He reports that he does not currently use alcohol. He reports that he does not use drugs.   Family History:  *** include only if pertinent The patient's family history includes Breast cancer in his maternal aunt; Coronary artery disease in his father; Diabetes in his father; Drug abuse in his son; Polycythemia in his mother.***  ROS:  Please see the history of present illness. All other systems are reviewed and otherwise negative.   PHYSICAL EXAM: *** VS:  There were no vitals taken for  this visit. BMI: There is no height or weight on file to calculate BMI.  GEN- The patient is well appearing, alert and oriented x 3 today.   HEENT: normocephalic, atraumatic; sclera clear, conjunctiva pink; hearing intact; oropharynx clear; neck supple, no JVP Lungs- Clear to ausculation bilaterally, normal work of breathing.  No wheezes, rales, rhonchi Heart- {Blank single:19197::"Regular","Irregularly irregular"} rate and rhythm, no murmurs, rubs or gallops, PMI not laterally displaced GI- soft, non-tender, non-distended, bowel sounds present, no hepatosplenomegaly Extremities- {EDEMA LEVEL:28147::"No"} peripheral edema. no clubbing or cyanosis; DP/PT/radial pulses 2+ bilaterally MS- no significant deformity or atrophy Skin- warm and dry, no rash or lesion, *** device pocket well-healed Psych- euthymic  mood, full affect Neuro- strength and sensation are intact   Device interrogation done today and reviewed by myself:  Battery *** Lead thresholds, impedence, sensing stable *** *** episodes *** changes made today  EKG {ACTION; IS/IS [id number] ordered. Personal review of EKG from {Blank single:19197::"today","***"} shows:  ***  Recent Labs: 10/01/2022: B Natriuretic Peptide 188.5; TSH 0.285 10/04/2022: Magnesium 2.5 11/15/2022: ALT 8; BUN 13; Creatinine, Ser 0.79; Hemoglobin 14.6; Platelets 207; Potassium 4.6; Sodium 143  10/07/2022: VLDL 13 11/15/2022: Chol/HDL Ratio 2.6; Cholesterol, Total 142; HDL 54; LDL Chol Calc (NIH) 73; LDL Direct 76; Triglycerides 76   Estimated Creatinine Clearance: 77.6 mL/min (by C-G formula based on SCr of 0.79 mg/dL).   Wt Readings from Last 3 Encounters:  11/15/22 191 lb 4 oz (86.8 kg)  11/10/22 240 lb (108.9 kg)  11/10/22 188 lb 3.2 oz (85.4 kg)     Additional studies reviewed include: Previous EP, cardiology notes.     ASSESSMENT AND PLAN:  #) ***   #) ***    Current medicines are reviewed at length with the patient today.   The  patient {ACTIONS; HAS/DOES NOT HAVE:19233} concerns regarding his medicines.  The following changes were made today:  {NONE DEFAULTED:18576}  Labs/ tests ordered today include: *** No orders of the defined types were placed in this encounter.    Disposition: Follow up with {EPMDS:28135} in {EPFOLLOW UP:28173}   Signed, Mamie Levers, NP  11/21/22  3:15 PM  Electrophysiology [facility]

## 2022-11-22 ENCOUNTER — Ambulatory Visit: Payer: Medicare Other | Attending: Physician Assistant | Admitting: Cardiology

## 2022-11-22 ENCOUNTER — Encounter: Payer: Self-pay | Admitting: Cardiology

## 2022-11-22 VITALS — BP 124/80 | HR 82 | Ht 68.0 in | Wt 188.8 lb

## 2022-11-22 DIAGNOSIS — Z950 Presence of cardiac pacemaker: Secondary | ICD-10-CM

## 2022-11-22 LAB — CUP PACEART INCLINIC DEVICE CHECK
Battery Remaining Longevity: 125 mo
Battery Voltage: 3.21 V
Brady Statistic AP VP Percent: 0 %
Brady Statistic AP VS Percent: 0 %
Brady Statistic AS VP Percent: 99.19 %
Brady Statistic AS VS Percent: 0.81 %
Brady Statistic RA Percent Paced: 0 %
Brady Statistic RV Percent Paced: 99.18 %
Date Time Interrogation Session: 20240212153907
Implantable Lead Connection Status: 753985
Implantable Lead Connection Status: 753985
Implantable Lead Implant Date: 20121128
Implantable Lead Implant Date: 20121128
Implantable Lead Location: 753858
Implantable Lead Location: 753860
Implantable Lead Model: 4194
Implantable Lead Model: 5076
Implantable Pulse Generator Implant Date: 20240131
Lead Channel Impedance Value: 3363 Ohm
Lead Channel Impedance Value: 3363 Ohm
Lead Channel Impedance Value: 342 Ohm
Lead Channel Impedance Value: 399 Ohm
Lead Channel Impedance Value: 418 Ohm
Lead Channel Impedance Value: 456 Ohm
Lead Channel Impedance Value: 456 Ohm
Lead Channel Impedance Value: 551 Ohm
Lead Channel Impedance Value: 684 Ohm
Lead Channel Pacing Threshold Amplitude: 0.625 V
Lead Channel Pacing Threshold Amplitude: 1.625 V
Lead Channel Pacing Threshold Pulse Width: 0.4 ms
Lead Channel Pacing Threshold Pulse Width: 0.4 ms
Lead Channel Setting Pacing Amplitude: 2 V
Lead Channel Setting Pacing Amplitude: 2.5 V
Lead Channel Setting Pacing Pulse Width: 0.4 ms
Lead Channel Setting Pacing Pulse Width: 0.4 ms
Lead Channel Setting Sensing Sensitivity: 4 mV
Zone Setting Status: 755011

## 2022-11-22 NOTE — Patient Instructions (Signed)
Medication Instructions:   Your physician recommends that you continue on your current medications as directed. Please refer to the Current Medication list given to you today.   *If you need a refill on your cardiac medications before your next appointment, please call your pharmacy*   Lab Work: [place]   If you have labs (blood work) drawn today and your tests are completely normal, you will receive your results only by: [place] (if you have MyChart) OR A paper copy in the mail If you have any lab test that is abnormal or we need to change your treatment, we will call you to review the results.   Testing/Procedures: NONE ORDERED  TODAY    Follow-Up: At [facility], you and your health needs are our priority.  As part of our continuing mission to provide you with exceptional heart care, we have created designated Provider Care Teams.  These Care Teams include your primary Cardiologist (physician) and Advanced Practice Providers (APPs -  Physician Assistants and Nurse Practitioners) who all work together to provide you with the care you need, when you need it.  We recommend signing up for the patient portal called "MyChart".  Sign up information is provided on this After Visit Summary.  MyChart is used to connect with patients for Virtual Visits (Telemedicine).  Patients are able to view lab/test results, encounter notes, upcoming appointments, etc.  Non-urgent messages can be sent to your provider as well.   To learn more about what you can do with MyChart, go to [email or web address].    Your next appointment:   AS SCHEDULED   Provider:   Cristopher Peru, MD    Other Instructions

## 2022-11-23 ENCOUNTER — Other Ambulatory Visit: Payer: Self-pay | Admitting: Internal Medicine

## 2022-11-30 ENCOUNTER — Other Ambulatory Visit: Payer: Self-pay | Admitting: Internal Medicine

## 2022-12-20 NOTE — Telephone Encounter (Signed)
Rerouting for one more attempt to call.

## 2022-12-29 ENCOUNTER — Ambulatory Visit (INDEPENDENT_AMBULATORY_CARE_PROVIDER_SITE_OTHER): Payer: Medicare Other | Admitting: Pharmacist

## 2022-12-29 ENCOUNTER — Telehealth: Payer: Self-pay | Admitting: Pharmacist

## 2022-12-29 DIAGNOSIS — I2510 Atherosclerotic heart disease of native coronary artery without angina pectoris: Secondary | ICD-10-CM

## 2022-12-29 DIAGNOSIS — I4891 Unspecified atrial fibrillation: Secondary | ICD-10-CM

## 2022-12-29 DIAGNOSIS — I5032 Chronic diastolic (congestive) heart failure: Secondary | ICD-10-CM

## 2022-12-29 DIAGNOSIS — I10 Essential (primary) hypertension: Secondary | ICD-10-CM

## 2022-12-29 MED ORDER — DAPAGLIFLOZIN PROPANEDIOL 10 MG PO TABS: 10.0000 mg | ORAL_TABLET | Freq: Every day | ORAL | 3 refills | Status: DC

## 2022-12-29 NOTE — Telephone Encounter (Signed)
  Received fax from [employer] [place] that [place] 10mg  was not covered. They recommended metformin but patient is actually taking [place] for CHF benefits so metformin is not an appropriate alternative. Called [employer] [phone] for prior authorization.   Prior authorization was approved Case id# [id number]  Approval dates: 11/29/2022 thru 12/29/2023

## 2022-12-29 NOTE — Progress Notes (Cosign Needed Addendum)
Pharmacy Note  12/29/2022 Name: Bradley VANSOMEREN Sr. MRN: [id number] DOB: [birth year]  Subjective: Bradley Jock Sr. is a 82 y.o. year old male who is a primary care patient of Colon Branch, MD. Clinical Pharmacist Practitioner referral was placed to assist with medication management.    Engaged with patient by telephone for follow up visit today.  Recent office and hospital visits:  11/22/2022 - Cardio Dawna Part, NP) Follow up pacemaker change out and wound check. No problems noted. No med changes noted.  11/15/2022 - PCP (Dr Larose Kells) seen for follow up. No med changes noted.  11/10/2022 - Procedure at [facility] (Dr Darnell Level. Lovena Le) for pacemaker change out. No medication changes noted.  11/04/2022 - Pulmonologist (Dr Melvyn Novas) No med changes noted.   COPD -  Current therapy: budesonide nebs twice a day and Duonebs as needed.  O2 setting is 4L  Reports not concerns with breathing.  Hypertension / CHF -  Current therapy: amlodipine 10mg  daily, furosemide 40mg  twice a day, Farxiga 10mg  daily and bisoprolol 10mg  daily.  Checking blood pressure at home 2 or 3 times per day. He did not provide exact numbers but states blood pressure usually 120 to 135 / 70 to 80.   BP Readings from Last 3 Encounters:  11/22/22 124/80  11/15/22 134/72  11/10/22 139/71    Patient is checking weight daily.  He endorsed that is knows to contact office of weight gain more than 3 lbs in 24 hours or 5lbs in 1 week or if he is having symptoms of worsening heart failure.   Hyperlipidemia:  Current therapy: atorvastatin 40mg  daily.  Dose of atorvastatin was increased about 3 or 4 months ago. LDL decreased from 108 (10/19/2022) to 73 (11/15/2022).   Objective: Review of patient status, including review of consultants reports, laboratory and other test data, was performed as part of comprehensive evaluation and provision of chronic care management services.   Lab Results  Component Value Date   CREATININE 0.79  11/15/2022   CREATININE 0.74 11/10/2022   CREATININE 0.83 10/19/2022    Lab Results  Component Value Date   HGBA1C 6.2 (H) 07/19/2022       Component Value Date/Time   CHOL 142 11/15/2022 0931   TRIG 76 11/15/2022 0931   HDL 54 11/15/2022 0931   CHOLHDL 2.6 11/15/2022 0931   CHOLHDL 3.1 10/07/2022 0506   VLDL 13 10/07/2022 0506   LDLCALC 73 11/15/2022 0931   LDLDIRECT 76 11/15/2022 0928   Lab Results  Component Value Date   CHOL 142 11/15/2022   CHOL 180 10/07/2022   CHOL 141 11/11/2021   Lab Results  Component Value Date   HDL 54 11/15/2022   HDL 58 10/07/2022   HDL 41.10 11/11/2021   Lab Results  Component Value Date   LDLCALC 73 11/15/2022   LDLCALC 109 (H) 10/07/2022   LDLCALC 70 11/11/2021   Lab Results  Component Value Date   TRIG 76 11/15/2022   TRIG 67 10/07/2022   TRIG 148.0 11/11/2021   Lab Results  Component Value Date   CHOLHDL 2.6 11/15/2022   CHOLHDL 3.1 10/07/2022   CHOLHDL 3 11/11/2021   Lab Results  Component Value Date   LDLDIRECT 76 11/15/2022   LDLDIRECT 108 (H) 10/19/2022     Clinical ASCVD: Yes  The ASCVD Risk score (Arnett DK, et al., 2019) failed to calculate for the following reasons:   The 2019 ASCVD risk score is only valid for ages 27  to 38   The patient has a prior MI or stroke diagnosis    BP Readings from Last 3 Encounters:  11/22/22 124/80  11/15/22 134/72  11/10/22 139/71     Allergies  Allergen Reactions   Hydrocodone Other (See Comments)    "given to him in the hospital; went thru withdrawals once home; dr said not to take it again" (09/27/2012) dizziness   Ultram [Tramadol] Other (See Comments)    "given to him in the hospital; went thru withdrawals once home; dr said not to take it again" (09/27/2012) dizziness   Cialis [Tadalafil] Other (See Comments)    Headache Backache  Dizziness    Avelox [Moxifloxacin Hydrochloride] Itching and Other (See Comments)    Headache Dizziness    Medications  Reviewed Today     Reviewed by Cherre Robins, RPH-CPP (Pharmacist) on 12/29/22 at 1056  Med List Status: <None>   Medication Order Taking? Sig Documenting Provider Last Dose Status Informant  acetaminophen (TYLENOL) 500 MG tablet [id number] Yes Take 1,000 mg by mouth every 8 (eight) hours as needed for headache or moderate pain. [provider] Taking Active Self  albuterol (VENTOLIN HFA) 108 (90 Base) MCG/ACT inhaler [id number] Yes Inhale 2 puffs into the lungs every 6 (six) hours as needed for wheezing or shortness of breath. Jonetta Osgood, MD Taking Active Self  amLODipine (NORVASC) 10 MG tablet [id number] Yes TAKE 1 TABLET DAILY Colon Branch, MD Taking Active   aspirin 81 MG tablet [id number] Yes Take 81 mg by mouth every morning.  [provider] Taking Active Self  atorvastatin (LIPITOR) 40 MG tablet [id number] Yes Take 1 tablet (40 mg total) by mouth at bedtime. Loel Dubonnet, NP Taking Active            Med Note Lanna Poche R   Wed Nov 10, 2022  1:24 PM)    bisoprolol (ZEBETA) 10 MG tablet [id number] Yes Take 1 tablet (10 mg total) by mouth daily. Colon Branch, MD Taking Active   budesonide (PULMICORT) 0.5 MG/2ML nebulizer solution [id number] Yes Take 2 mLs (0.5 mg total) by nebulization 2 (two) times daily. Colon Branch, MD Taking Active Self  calcium-vitamin D (OSCAL WITH D) 500-200 MG-UNIT tablet [id number] Yes Take 1 tablet by mouth every morning. [provider] Taking Active Self  cholecalciferol (VITAMIN D3) 25 MCG (1000 UNIT) tablet [id number] Yes Take 1,000 Units by mouth every evening. [provider] Taking Active Self  diphenhydramine-acetaminophen (TYLENOL PM) 25-500 MG TABS tablet [id number] Yes Take 1 tablet by mouth at bedtime as needed (sleep). [provider] Taking Active Self  FARXIGA 10 MG TABS tablet [id number] Yes TAKE 1 TABLET DAILY BEFORE BREAKFAST  Patient taking differently: Take 10 mg by mouth in the morning.   Colon Branch, MD Taking Active Self  furosemide (LASIX) 40 MG tablet [id number] Yes TAKE 2 TABLETS EVERY MORNING AND 1 TABLET LATE IN THE AFTERNOON  Patient taking differently: Take 40 mg by mouth in the morning, at noon, and at bedtime.   Skeet Latch, MD Taking Active Self  guaiFENesin (MUCINEX) 600 MG 12 hr tablet [id number] Yes Take 600 mg by mouth 2 (two) times daily. [provider] Taking Active Self  ipratropium-albuterol (DUONEB) 0.5-2.5 (3) MG/3ML SOLN [id number] Yes Take 3 mLs by nebulization every 6 (six) hours as needed.  Patient taking differently: Take 3 mLs by nebulization in the morning and at bedtime.   Colon Branch, MD Taking Active Self  Lancets (ONETOUCH ULTRASOFT) lancets [id number] Yes Check blood sugars no more than twice daily Paz, Alda Berthold, MD Taking Active Self  Multiple Vitamins-Minerals (MULTIVITAMINS THER. W/MINERALS) TABS tablet [id number] Yes Take 1 tablet by mouth daily. Colon Branch, MD Taking Active Self  nitroGLYCERIN (NITROSTAT) 0.4 MG SL tablet [id number] Yes Place 1 tablet (0.4 mg total) under the tongue every 5 (five) minutes x 3 doses as needed for chest pain. Colon Branch, MD Taking Active Self           Med Note [name] Nov 22, 2022  3:08 PM)    [facility] VERIO test strip [id number] Yes USE TO CHECK BLOOD SUGAR NO MORE THAN TWICE A DAY Colon Branch, MD Taking Active Self  OXYGEN [id number] Yes Inhale 4 L/min into the lungs continuous. [provider] Taking Active   pantoprazole (PROTONIX) 40 MG tablet [id number] Yes TAKE 1 TABLET DAILY BEFORE BREAKFAST Paz, Alda Berthold, MD Taking Active   tamsulosin [facility]) 0.4 MG CAPS capsule [id number] Yes Take 1 capsule (0.4 mg total) by mouth daily after supper. Colon Branch, MD Taking Active             Patient Active Problem List   Diagnosis Date Noted   Acute on chronic congestive heart failure ([place]) 10/05/2022   Acute on chronic combined systolic and diastolic CHF (congestive heart failure)  ([place]) 10/05/2022   Pneumonia due to COVID-19 virus 10/01/2022   Acute respiratory failure with hypoxemia ([place]) 08/27/2022   COVID 08/23/2022   Displaced fracture of shaft of third metacarpal bone, left hand, sub encounter for closed fracture 08/03/2022   ILD (interstitial lung disease) ([place]) 07/30/2022   CAD (coronary artery disease) 07/19/2022   Acute on chronic diastolic heart failure ([place]) 07/18/2022   Acute on chronic respiratory failure with hypoxia ([place]) 07/18/2022   GERD without esophagitis 07/30/2021   Cor pulmonale (chronic) ([place]) 08/05/2020   COPD exacerbation ([place]) 12/22/2018   COPD,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 12/22/2018   PCP NOTES >>> 07/15/2015   Annual physical exam 05/08/2012   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   Sinoatrial node dysfunction (HCC)    Non-ischemic cardiomyopathy ([place]) 09/06/2011   Chronic diastolic heart failure (HCC)    PAD (peripheral artery disease) ([place]) 10/22/2010   DERMATITIS, STASIS 09/14/2010   Chronic respiratory failure with hypoxia and hypercapnia ([place]) 09/14/2010   Chronic pulmonary hypertension secondary to elevated L H pressures  04/15/2009   ERECTILE DYSFUNCTION 01/06/2009   Mixed diabetic hyperlipidemia associated with type 2 diabetes mellitus ([place]) 12/13/2007   Essential hypertension 12/13/2007   Anxiety--insomnia 08/14/2007   Type 2 diabetes mellitus ([place]) 01/02/2007   Atrial fibrillation ([place]) 01/02/2007   Benign enlargement of prostate 01/02/2007     Medication Assistance:  None required.  Patient affirms current coverage meets needs.   Assessment / Plan:  Hypertension / CHF: goal blood pressure < 130/80 Continue bisoprolol 10mg  daily and amlodipine 10mg  daily for blood pressure - monitor for edema Continue Farxiga and furosemide for CHF Continue to check weight daily Continue to limit intake of sodium in diet.  Check blood pressure 2 to 3 times per week and record (can record on same sheet as weight)   COPD:  Continue  budesonide and DuoNeb nebs   Hyperlipidemia: goal LDL < 70 Continue to take atorvastatin 40mg  daily  Medication Management:  Reviewed med list and updated Reviewed refill history and adherence. Reminded that [name] will be due to be refilled soon.  Follow Up:  Telephone follow up appointment with care management team member scheduled for:  2 to 3 months   Cherre Robins, PharmD Clinical Pharmacist [place] [phone]   Addendum 12/29/2022 Received fax from [place] that [place] 10mg  was not covered. They recommended metformin but patient is actually taking [place] for CHF benefits so metformin is not an appropriate alternative. Called [facility] [phone] for prior authorization.  Case id# [id number] 11/29/2022 thru 12/29/2023

## 2022-12-31 ENCOUNTER — Other Ambulatory Visit (HOSPITAL_COMMUNITY): Payer: Self-pay

## 2022-12-31 ENCOUNTER — Telehealth: Payer: Self-pay

## 2022-12-31 NOTE — Telephone Encounter (Signed)
Patient Advocate Encounter  Prior Authorization for Farxiga 10mg  has been approved.    PA# [id number] Effective dates: 11/29/2022 through 12/29/2023  Approval letter attached to chart

## 2023-01-03 NOTE — Telephone Encounter (Signed)
Noted  

## 2023-01-10 ENCOUNTER — Telehealth: Payer: Self-pay | Admitting: Internal Medicine

## 2023-01-10 NOTE — Telephone Encounter (Signed)
Patient states he received a medication (farxiga) through mail order and he is unsure why he got it. Medication was ordered by our pharmacist, Tammy E. Please advise.

## 2023-01-11 NOTE — Telephone Encounter (Signed)
Reviewed refill history. Looks like [facility] dispensed generic for [name] 12/29/2022 - name is dapagliflozin.  Patient called and education about new generic provided. Patient voiced understanding.

## 2023-01-17 ENCOUNTER — Telehealth: Payer: Self-pay | Admitting: Internal Medicine

## 2023-01-17 NOTE — Telephone Encounter (Signed)
Pt called back, stated he has pain in his shoulder / above his pacemaker site;  It worsens when he raises his left arm.   Pt requesting a device appointment to have an RN / Dr. Ladona Ridgel assess his wound site, and address his concern.    Per Boneta Lucks Device RN, Pt can be seen on Wednesday 01/19/2023 at 900 am.  Dr. Ladona Ridgel will be in clinic this day.  Pt contacted and appreciated the call back and appointment to see Device RN.

## 2023-01-17 NOTE — Telephone Encounter (Signed)
Patient is requesting a callback from Dr. Lubertha Basque nurse. Patient did not state what it was in regards to.

## 2023-01-17 NOTE — Telephone Encounter (Signed)
Pt is called asking to speak with Dr. Ladona Ridgel nurse. I told him I will send a phone note to his nurse to give him a call back.

## 2023-01-19 ENCOUNTER — Ambulatory Visit: Payer: Medicare Other | Attending: Cardiology

## 2023-01-19 DIAGNOSIS — I428 Other cardiomyopathies: Secondary | ICD-10-CM

## 2023-01-19 NOTE — Progress Notes (Signed)
Site assessed by Dr. Ladona Ridgel due to patients c/o of pain when lifting arm. Incision site healed, no redness or edema noted. Left shoulder palpated by Dr. Ladona Ridgel per patient there is at pain at the shoulder joint, No pain noted at device site. Dr. Ladona Ridgel recommended patient follow up with PCP to rule out possible bursitis. Dr Ladona Ridgel recommended patient take  Tylenol. Patient voiced understanding.

## 2023-01-24 ENCOUNTER — Other Ambulatory Visit: Payer: Self-pay

## 2023-01-24 ENCOUNTER — Encounter: Payer: Self-pay | Admitting: *Deleted

## 2023-01-24 ENCOUNTER — Encounter (HOSPITAL_BASED_OUTPATIENT_CLINIC_OR_DEPARTMENT_OTHER): Payer: Self-pay | Admitting: Emergency Medicine

## 2023-01-24 ENCOUNTER — Emergency Department (HOSPITAL_BASED_OUTPATIENT_CLINIC_OR_DEPARTMENT_OTHER): Payer: Medicare Other

## 2023-01-24 ENCOUNTER — Emergency Department (HOSPITAL_BASED_OUTPATIENT_CLINIC_OR_DEPARTMENT_OTHER)
Admission: EM | Admit: 2023-01-24 | Discharge: 2023-01-24 | Disposition: A | Discharge status: Home / Self Care | Payer: Medicare Other | Attending: Emergency Medicine | Admitting: Emergency Medicine

## 2023-01-24 DIAGNOSIS — I2510 Atherosclerotic heart disease of native coronary artery without angina pectoris: Secondary | ICD-10-CM | POA: Diagnosis not present

## 2023-01-24 DIAGNOSIS — Z7984 Long term (current) use of oral hypoglycemic drugs: Secondary | ICD-10-CM | POA: Insufficient documentation

## 2023-01-24 DIAGNOSIS — L03115 Cellulitis of right lower limb: Secondary | ICD-10-CM | POA: Diagnosis present

## 2023-01-24 DIAGNOSIS — Z79899 Other long term (current) drug therapy: Secondary | ICD-10-CM | POA: Insufficient documentation

## 2023-01-24 DIAGNOSIS — J449 Chronic obstructive pulmonary disease, unspecified: Secondary | ICD-10-CM | POA: Diagnosis not present

## 2023-01-24 DIAGNOSIS — Z7982 Long term (current) use of aspirin: Secondary | ICD-10-CM | POA: Diagnosis not present

## 2023-01-24 DIAGNOSIS — E119 Type 2 diabetes mellitus without complications: Secondary | ICD-10-CM | POA: Insufficient documentation

## 2023-01-24 DIAGNOSIS — I5032 Chronic diastolic (congestive) heart failure: Secondary | ICD-10-CM | POA: Insufficient documentation

## 2023-01-24 DIAGNOSIS — Z950 Presence of cardiac pacemaker: Secondary | ICD-10-CM | POA: Insufficient documentation

## 2023-01-24 DIAGNOSIS — I110 Hypertensive heart disease with heart failure: Secondary | ICD-10-CM | POA: Insufficient documentation

## 2023-01-24 LAB — COMPREHENSIVE METABOLIC PANEL
ALT: 12 U/L (ref 0–44)
AST: 18 U/L (ref 15–41)
Albumin: 3.6 g/dL (ref 3.5–5.0)
Alkaline Phosphatase: 78 U/L (ref 38–126)
Anion gap: 10 (ref 5–15)
BUN: 17 mg/dL (ref 8–23)
CO2: 29 mmol/L (ref 22–32)
Calcium: 8.9 mg/dL (ref 8.9–10.3)
Chloride: 98 mmol/L (ref 98–111)
Creatinine, Ser: 1 mg/dL (ref 0.61–1.24)
GFR, Estimated: >60 mL/min (ref >60)
Glucose, Bld: 154 mg/dL — ABNORMAL HIGH (ref 70–99)
Potassium: 3.9 mmol/L (ref 3.5–5.1)
Sodium: 137 mmol/L (ref 135–145)
Total Bilirubin: 0.8 mg/dL (ref 0.3–1.2)
Total Protein: 6.9 g/dL (ref 6.5–8.1)

## 2023-01-24 LAB — CBC WITH DIFFERENTIAL/PLATELET
Abs Immature Granulocytes: 0.03 10*3/uL (ref 0.00–0.07)
Basophils Absolute: 0 10*3/uL (ref 0.0–0.1)
Basophils Relative: 1 %
Eosinophils Absolute: 0.1 10*3/uL (ref 0.0–0.5)
Eosinophils Relative: 2 %
HCT: 39.3 % (ref 39.0–52.0)
Hemoglobin: 13 g/dL (ref 13.0–17.0)
Immature Granulocytes: 1 %
Lymphocytes Relative: 24 %
Lymphs Abs: 1.6 10*3/uL (ref 0.7–4.0)
MCH: 29.8 pg (ref 26.0–34.0)
MCHC: 33.1 g/dL (ref 30.0–36.0)
MCV: 90.1 fL (ref 80.0–100.0)
Monocytes Absolute: 0.5 10*3/uL (ref 0.1–1.0)
Monocytes Relative: 8 %
Neutro Abs: 4.3 10*3/uL (ref 1.7–7.7)
Neutrophils Relative %: 64 %
Platelets: 210 10*3/uL (ref 150–400)
RBC: 4.36 MIL/uL (ref 4.22–5.81)
RDW: 14 % (ref 11.5–15.5)
WBC: 6.6 10*3/uL (ref 4.0–10.5)
nRBC: 0 % (ref 0.0–0.2)

## 2023-01-24 LAB — BRAIN NATRIURETIC PEPTIDE: B Natriuretic Peptide: 333.7 pg/mL — ABNORMAL HIGH (ref 0.0–100.0)

## 2023-01-24 LAB — LACTIC ACID, PLASMA: Lactic Acid, Venous: 1.4 mmol/L (ref 0.5–1.9)

## 2023-01-24 MED ORDER — DOXYCYCLINE HYCLATE 100 MG PO CAPS: 100.0000 mg | ORAL_CAPSULE | Freq: Two times a day (BID) | ORAL | 0 refills | Status: DC

## 2023-01-24 NOTE — ED Notes (Signed)
R Great toe noted with nail bed at base torn and dry blood noted .per Pt. No pain in the toe. Pedal pulse noted in the R foot.  R shin noted with 2 wounds open and clean that pt. States has been there for a couple of weeks. No drainage from the wounds but the Pt. Reports the pain is 5/10

## 2023-01-24 NOTE — Discharge Instructions (Addendum)
Please take your antibiotics as prescribed. Take tylenol/ibuprofen for pain. I recommend close follow-up with PCP for reevaluation.  Please do not hesitate to return to emergency department if worrisome signs symptoms we discussed become apparent.  

## 2023-01-24 NOTE — ED Triage Notes (Signed)
Pt arrives pov, slow gait, reports RLE pain swelling and redness x 1 week.  Pt diabetic, open wounds noted to anterior, lower extremity. Pt also reports LT great toe nail "falling off". Pt uses 4L O2 at home. Came without oxygen

## 2023-01-24 NOTE — ED Provider Notes (Cosign Needed Addendum)
[facility] Provider Note   CSN: [id number] Arrival date & time: 01/24/23  1528     History  Chief Complaint  Patient presents with   Leg Swelling    Bradley Searles Sr. is a 82 y.o. male with a past medical history of anemia, osteoarthritis, chronic atrial fibrillation status post AV nodal ablation and biventricular pacemaker, BPH, bronchitis, CAD, chronic diastolic heart failure on 5 L O2 at home, pulmonary hypertension, COPD today for evaluation of a wound on his leg.  Patient noted redness and skin discoloration and itchiness on his left leg in the last couple weeks.  He denies any pain stating it is only itchy.  He denies any blood or fluid drainage from it.  Denies any recent injury to his leg.  No fever.  Patient has been compliant with this diabetes medication.  Patient also mentioned that he hit his right big toe to the bathroom door yesterday which caused bleeding.  He denies any pain at the moment.  History of diabetes and has been compliant with his medication.  HPI  Past Medical History:  Diagnosis Date   Abnormal CT scan, chest 2012   CT chest, several lymphadenopathies. Sees pulmonary   Anemia    intermittent   Arthritis    "?back" (09/19/2018)   Atrial fibrillation    s/p AV node ablation & BiV PPM implantation 09/08/11 (op dictation pending)   BPH (benign prostatic hyperplasia)    Saw Dr [name] 2004, normal renal u/s   Bronchitis 08/26/2017   CAD (coronary artery disease)    CHF (congestive heart failure)    Thought primarily to be non-systolic although EF down (EF 40-98% 12/2010, down to 35-40% 09/05/11), cath 2008 with no CAD, nuclear study 07/2011 showing Small area of reversibility in the distal ant/lat wall the left ventricle suspicious for ischemia/septal wall HK but felt to be low risk  (per D/C Summary 07/2011)   Chronic bronchitis    "get it ~ q yr"   Chronic diastolic heart failure         Chronic  respiratory failure with hypoxia and hypercapnia 09/14/2010   Followed in Pulmonary clinic/ [facility]/ [facility]  Started on 02 2lpm at discharge 09/23/11   - PFT's 10/28/2011  FEV1  1.40 (51%)  with ratio 70 and no better p B2 and DLCO 53 corrects to 101    - PFT's 10/18/2014    FEV1 1.71 (59%) with ratio 68 and no sign change p B2 and DLCO 53 corrects to 85  -  HC03   07/28/20  = 33  -  HCO3   03/17/21     = 31     Heart murmur    HLD (hyperlipidemia)    HTN (hypertension)    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   Pleural effusion 2008   S/p decortication   Pneumonia 08/02/2011   Pulmonary HTN    per cath 2008   Type II diabetes mellitus 1999   Past Surgical History:  Procedure Laterality Date   BI-VENTRICULAR PACEMAKER INSERTION Left 09/08/2011   Procedure: BI-VENTRICULAR PACEMAKER INSERTION (CRT-P);  Surgeon: Marinus Maw, MD;  Location: [facility] CATH LAB;  Service: Cardiovascular;  Laterality: Left;  BIV PACEMAKER GENERATOR CHANGEOUT N/A 11/10/2022   Procedure: BIV PACEMAKER GENERATOR CHANGEOUT;  Surgeon: Marinus Maw, MD;  Location: [facility] INVASIVE CV LAB;  Service: Cardiovascular;  Laterality: N/A;   CARDIAC CATHETERIZATION     "couple times; never had balloon or stent" (09/19/2018)   CATARACT EXTRACTION W/ INTRAOCULAR LENS  IMPLANT, BILATERAL Bilateral 08/2018   COLONOSCOPY  03/10/11   normal   INSERT / REPLACE / REMOVE PACEMAKER  09/08/11   pacemaker placement   LUNG DECORTICATION     PLEURAL SCARIFICATION     pneumothorax with fibrothorax  ~ 2010   RIGHT/LEFT HEART CATH AND CORONARY ANGIOGRAPHY N/A 10/05/2022   Procedure: RIGHT/LEFT HEART CATH AND CORONARY ANGIOGRAPHY;  Surgeon: Corky Crafts, MD;  Location: [facility] INVASIVE CV LAB;  Service: Cardiovascular;  Laterality: N/A;   TONSILLECTOMY     "as a kid"       Home Medications Prior to Admission medications   Medication Sig Start Date End Date Taking? Authorizing Provider  doxycycline (VIBRAMYCIN) 100 MG capsule Take 1 capsule (100 mg total) by mouth 2 (two) times daily. 01/24/23  Yes Jeanelle Malling, PA  acetaminophen (TYLENOL) 500 MG tablet Take 1,000 mg by mouth every 8 (eight) hours as needed for headache or moderate pain.    [provider]  albuterol (VENTOLIN HFA) 108 (90 Base) MCG/ACT inhaler Inhale 2 puffs into the lungs every 6 (six) hours as needed for wheezing or shortness of breath. 03/11/21   Ghimire, Werner Lean, MD  amLODipine (NORVASC) 10 MG tablet TAKE 1 TABLET DAILY 11/30/22   [name], MD  aspirin 81 MG tablet Take 81 mg by mouth every morning.     [provider]  atorvastatin (LIPITOR) 40 MG tablet Take 1 tablet (40 mg total) by mouth at bedtime. 10/21/22   Alver Sorrow, NP  bisoprolol (ZEBETA) 10 MG tablet Take 1 tablet (10 mg total) by mouth daily. 11/08/22   [name], MD  budesonide (PULMICORT) 0.5 MG/2ML nebulizer solution Take 2 mLs (0.5 mg total) by nebulization 2 (two) times daily. 08/02/22   [name], MD  calcium-vitamin D (OSCAL WITH D) 500-200 MG-UNIT tablet Take 1 tablet by mouth every morning.    [provider]  cholecalciferol (VITAMIN D3) 25 MCG (1000 UNIT) tablet Take 1,000 Units by mouth every evening.    [provider]  dapagliflozin propanediol (FARXIGA) 10 MG TABS tablet Take 1 tablet (10 mg total) by mouth daily before breakfast. 12/29/22   [name], MD  diphenhydramine-acetaminophen (TYLENOL PM) 25-500 MG TABS tablet Take 1 tablet by mouth at bedtime as needed (sleep).    [provider]  furosemide (LASIX) 40 MG tablet TAKE 2 TABLETS EVERY MORNING AND 1 TABLET LATE IN THE AFTERNOON Patient taking differently: Take 40 mg by mouth in the morning, at noon, and at bedtime. 02/26/22   [name], MD  guaiFENesin (MUCINEX) 600 MG 12 hr tablet Take 600 mg by  mouth 2 (two) times daily.    [provider]  ipratropium-albuterol (DUONEB) 0.5-2.5 (3) MG/3ML SOLN Take 3 mLs by nebulization every 6 (six) hours as needed. Patient taking differently: Take 3 mLs by nebulization in the morning and at bedtime. 08/02/22   [name], MD  Lancets [facility] ULTRASOFT) lancets Check blood sugars no more than twice daily 03/08/17   [name], MD  Multiple Vitamins-Minerals (MULTIVITAMINS THER. W/MINERALS) TABS tablet Take 1 tablet by mouth daily. 09/14/13   [name]  E, MD  nitroGLYCERIN (NITROSTAT) 0.4 MG SL tablet Place 1 tablet (0.4 mg total) under the tongue every 5 (five) minutes x 3 doses as needed for chest pain. 09/07/21   [name], MD  [facility] VERIO test strip USE TO CHECK BLOOD SUGAR NO MORE THAN TWICE A DAY 02/09/21   [name], MD  OXYGEN Inhale 4 L/min into the lungs continuous.    [provider]  pantoprazole (PROTONIX) 40 MG tablet TAKE 1 TABLET DAILY BEFORE BREAKFAST 11/30/22   [name], MD  tamsulosin (FLOMAX) 0.4 MG CAPS capsule Take 1 capsule (0.4 mg total) by mouth daily after supper. 11/23/22   [name], MD      Allergies    Hydrocodone, Ultram [tramadol], Cialis [tadalafil], and Avelox [moxifloxacin hydrochloride]    Review of Systems   Review of Systems Negative except as per HPI.  Physical Exam Updated Vital Signs BP (!) 145/85   Pulse 68   Temp 98.1 F (36.7 C)   Resp 16   Ht 5\' 8"  (1.727 m)   Wt 88.5 kg   SpO2 90%   BMI 29.65 kg/m  Physical Exam Vitals and nursing note reviewed.  Constitutional:      Appearance: Normal appearance.  HENT:     Head: Normocephalic and atraumatic.     Mouth/Throat:     Mouth: Mucous membranes are moist.  Eyes:     General: No scleral icterus. Cardiovascular:     Rate and Rhythm: Normal rate and regular rhythm.     Pulses: Normal pulses.          Dorsalis pedis pulses are 2+ on the right side and 2+ on the left side.       Posterior tibial pulses are 2+ on the  right side and 2+ on the left side.     Heart sounds: Normal heart sounds.  Pulmonary:     Effort: Pulmonary effort is normal.     Breath sounds: Normal breath sounds.  Abdominal:     General: Abdomen is flat.     Palpations: Abdomen is soft.     Tenderness: There is no abdominal tenderness.  Musculoskeletal:        General: No deformity.  Skin:    General: Skin is warm.     Findings: No rash.     Comments: Skin erythema and skin discoloration to the right lower leg (see picture).  No blood or fluid drainage noted.  Neurological:     General: No focal deficit present.     Mental Status: He is alert.  Psychiatric:        Mood and Affect: Mood normal.      ED Results / Procedures / Treatments   Labs (all labs ordered are listed, but only abnormal results are displayed) Labs Reviewed  COMPREHENSIVE METABOLIC PANEL - Abnormal; Notable for the following components:      Result Value   Glucose, Bld 154 (*)    All other components within normal limits  BRAIN NATRIURETIC PEPTIDE - Abnormal; Notable for the following components:   B Natriuretic Peptide 333.7 (*)    All other components within normal limits  LACTIC ACID, PLASMA  CBC WITH DIFFERENTIAL/PLATELET  URINALYSIS, ROUTINE W REFLEX MICROSCOPIC    EKG None  Radiology US Venous Img Lower Unilateral Right (DVT)  Result Date: 01/24/2023 CLINICAL DATA:  Diabetic ulcer EXAM: RIGHT LOWER EXTREMITY VENOUS DOPPLER ULTRASOUND TECHNIQUE: Gray-scale sonography with graded compression, as well as color  Doppler and duplex ultrasound were performed to evaluate the lower extremity deep venous systems from the level of the common femoral vein and including the common femoral, femoral, profunda femoral, popliteal and calf veins including the posterior tibial, peroneal and gastrocnemius veins when visible. The superficial great saphenous vein was also interrogated. Spectral Doppler was utilized to evaluate flow at rest and with distal  augmentation maneuvers in the common femoral, femoral and popliteal veins. COMPARISON:  None Available. FINDINGS: Contralateral Common Femoral Vein: Respiratory phasicity is normal and symmetric with the symptomatic side. No evidence of thrombus. Normal compressibility. Common Femoral Vein: No evidence of thrombus. Normal compressibility, respiratory phasicity and response to augmentation. Saphenofemoral Junction: No evidence of thrombus. Normal compressibility and flow on color Doppler imaging. Profunda Femoral Vein: No evidence of thrombus. Normal compressibility and flow on color Doppler imaging. Femoral Vein: No evidence of thrombus. Normal compressibility, respiratory phasicity and response to augmentation. Popliteal Vein: No evidence of thrombus. Normal compressibility, respiratory phasicity and response to augmentation. Calf Veins: No evidence of thrombus. Normal compressibility and flow on color Doppler imaging. Superficial Great Saphenous Vein: No evidence of thrombus. Normal compressibility. Venous Reflux:  None. Other Findings:  None. IMPRESSION: No evidence of deep venous thrombosis. Electronically Signed   By: Layla Maw M.D.   On: 01/24/2023 18:11   DG Foot 2 Views Right  Result Date: 01/24/2023 CLINICAL DATA:  Foot injury EXAM: RIGHT FOOT - 2 VIEW COMPARISON:  None Available. FINDINGS: Osteopenia. On the lateral view there is deformity of the base of the distal phalanx of the great toe. Preserved joint spaces except for joint space loss with some sclerosis along several interphalangeal joints. Evaluation of the midfoot laterally is limited without an oblique view. No erosive changes. If there is further concern IMPRESSION: Severe osteopenia. Subtle fracture seen of the base of the distal phalanx of the great toe on the lateral view. Please correlate to point tenderness Electronically Signed   By: Karen Kays M.D.   On: 01/24/2023 17:33    Procedures Procedures    Medications Ordered  in ED Medications - No data to display  ED Course/ Medical Decision Making/ A&P                             Medical Decision Making Amount and/or Complexity of Data Reviewed Labs: ordered. Radiology: ordered.  Risk Prescription drug management.   This patient presents to the ED for wound evaluation, this involves an extensive number of treatment options, and is a complaint that carries with a high risk of complications and morbidity.  The differential diagnosis includes cellulitis, osteomyelitis, diabetic ulcers, infectious etiology.  This is not an exhaustive list.  Lab tests: I ordered and personally interpreted labs.  The pertinent results include: WBC unremarkable. Hbg unremarkable. Platelets unremarkable. Electrolytes unremarkable. BUN, creatinine unremarkable.  BNP elevated 333.7, not significant from baseline.  Imaging studies: I ordered imaging studies. I personally reviewed, interpreted imaging and agree with the radiologist's interpretations. The results include: X-ray of the right foot showed subtle fracture seen on the base of the distal phalanx of the great toe.  Problem list/ ED course/ Critical interventions/ Medical management: HPI: See above Vital signs within normal range and stable throughout visit. Laboratory/imaging studies significant for: See above. On physical examination, patient is afebrile and appears in no acute distress. This patient presents with initial presentation of local erythema, warmth, swelling of the right lower extremity concerning for cellulitis.  Sensitivity/pain to light touch around the erythematous area. No lymphangitic spread visible and no fluid pockets or fluctuance concerning for abscess noted. Low concern for osteomyelitis or DVT.  Ultrasound DVT study negative.  No immune compromise, bullae, pain out of proportion, or rapid progression concerning for necrotizing fasciitis.  Neurovascular function intact on both legs.  X-ray of the right  foot shows subtle fracture seen on the base of the distal phalanx of the great toe.  Nursing staff will buddy tape the right big toe.  Patient to be discharged home with Doxy with follow up with their PCP.  Strict return precaution given.  I have reviewed the patient home medicines and have made adjustments as needed.  Cardiac monitoring/EKG: The patient was maintained on a cardiac monitor.  I personally reviewed and interpreted the cardiac monitor which showed an underlying rhythm of: sinus rhythm.  Additional history obtained: External records from outside source obtained and reviewed including: Chart review including previous notes, labs, imaging.  Consultations obtained:  Disposition Continued outpatient therapy. Follow-up with PCP recommended for reevaluation of symptoms. Treatment plan discussed with patient.  Pt acknowledged understanding was agreeable to the plan. Worrisome signs and symptoms were discussed with patient, and patient acknowledged understanding to return to the ED if they noticed these signs and symptoms. Patient was stable upon discharge.   This chart was dictated using voice recognition software.  Despite best efforts to proofread,  errors can occur which can change the documentation meaning. \        Final Clinical Impression(s) / ED Diagnoses Final diagnoses:  Cellulitis of right lower extremity    Rx / DC Orders ED Discharge Orders          Ordered    doxycycline (VIBRAMYCIN) 100 MG capsule  2 times daily        01/24/23 1834              Jeanelle Malling, PA 01/24/23 1909    Jeanelle Malling, PA 01/24/23 1910    Ernie Avena, MD 01/25/23 [phone]

## 2023-02-02 ENCOUNTER — Ambulatory Visit (INDEPENDENT_AMBULATORY_CARE_PROVIDER_SITE_OTHER): Payer: Medicare Other | Admitting: Internal Medicine

## 2023-02-02 VITALS — BP 128/72 | HR 92 | Temp 97.5°F | Resp 16 | Ht 69.0 in | Wt 186.4 lb

## 2023-02-02 DIAGNOSIS — R7989 Other specified abnormal findings of blood chemistry: Secondary | ICD-10-CM

## 2023-02-02 DIAGNOSIS — E0590 Thyrotoxicosis, unspecified without thyrotoxic crisis or storm: Secondary | ICD-10-CM

## 2023-02-02 DIAGNOSIS — J449 Chronic obstructive pulmonary disease, unspecified: Secondary | ICD-10-CM

## 2023-02-02 DIAGNOSIS — L309 Dermatitis, unspecified: Secondary | ICD-10-CM | POA: Diagnosis not present

## 2023-02-02 DIAGNOSIS — M8080XD Other osteoporosis with current pathological fracture, unspecified site, subsequent encounter for fracture with routine healing: Secondary | ICD-10-CM | POA: Diagnosis not present

## 2023-02-02 DIAGNOSIS — M810 Age-related osteoporosis without current pathological fracture: Secondary | ICD-10-CM

## 2023-02-02 NOTE — Patient Instructions (Addendum)
   GO TO THE LAB : Get the blood work      STOP BY THE FIRST FLOOR: Arrange for a bone density test  See you next month

## 2023-02-02 NOTE — Progress Notes (Unsigned)
Subjective:    Patient ID: Bradley Sit., male    DOB: 1940/10/15, 82 y.o.   MRN: [id number]  DOS:  02/02/2023 Type of visit - description: ER f/u  The patient woke up, was trying to go to the bathroom when he stepped on the dog and accidentally injured a right great toe and noted some bleeding. Went to the ER next day on 01/24/2023. Workup included: Labs showed blood sugar 154, lactic acid negative, white count normal, BNP 333, higher than usual. X-rays, right foot: Osteopenia, subtle fracture at the distal phalanx. Ultrasound right leg negative for DVT.  ER recommended antibiotics for possible infection at the right pretibial area.  He is still taking that without any problems. BNP was elevated but he denies any shortness of breath, edema or chest pain. Denies any pain at the right great toe.  Review of Systems See above   Past Medical History:  Diagnosis Date   Abnormal CT scan, chest 2012   CT chest, several lymphadenopathies. Sees pulmonary   Anemia    intermittent   Arthritis    "?back" (09/19/2018)   Atrial fibrillation    s/p AV node ablation & BiV PPM implantation 09/08/11 (op dictation pending)   BPH (benign prostatic hyperplasia)    Saw Dr [name] 2004, normal renal u/s   Bronchitis 08/26/2017   CAD (coronary artery disease)    CHF (congestive heart failure)    Thought primarily to be non-systolic although EF down (EF 40-98% 12/2010, down to 35-40% 09/05/11), cath 2008 with no CAD, nuclear study 07/2011 showing Small area of reversibility in the distal ant/lat wall the left ventricle suspicious for ischemia/septal wall HK but felt to be low risk  (per D/C Summary 07/2011)   Chronic bronchitis    "get it ~ q yr"   Chronic diastolic heart failure         Chronic respiratory failure with hypoxia and hypercapnia 09/14/2010   Followed in Pulmonary clinic/ [facility]/ [facility]  Started on 02 2lpm at discharge 09/23/11   - PFT's 10/28/2011  FEV1  1.40 (51%)  with  ratio 70 and no better p B2 and DLCO 53 corrects to 101    - PFT's 10/18/2014    FEV1 1.71 (59%) with ratio 68 and no sign change p B2 and DLCO 53 corrects to 85  -  HC03   07/28/20  = 33  -  HCO3   03/17/21     = 31     Heart murmur    HLD (hyperlipidemia)    HTN (hypertension)    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   Pleural effusion 2008   S/p decortication   Pneumonia 08/02/2011   Pulmonary HTN    per cath 2008   Type II diabetes mellitus 1999    Past Surgical History:  Procedure Laterality Date   BI-VENTRICULAR PACEMAKER INSERTION Left 09/08/2011   Procedure: BI-VENTRICULAR PACEMAKER INSERTION (CRT-P);  Surgeon: Marinus Maw, MD;  Location: [facility] CATH LAB;  Service: Cardiovascular;  Laterality: Left;   BIV PACEMAKER GENERATOR CHANGEOUT N/A 11/10/2022   Procedure: BIV PACEMAKER GENERATOR CHANGEOUT;  Surgeon: Marinus Maw, MD;  Location: [facility] INVASIVE CV LAB;  Service: Cardiovascular;  Laterality: N/A;   CARDIAC CATHETERIZATION     "  couple times; never had balloon or stent" (09/19/2018)   CATARACT EXTRACTION W/ INTRAOCULAR LENS  IMPLANT, BILATERAL Bilateral 08/2018   COLONOSCOPY  03/10/11   normal   INSERT / REPLACE / REMOVE PACEMAKER  09/08/11   pacemaker placement   LUNG DECORTICATION     PLEURAL SCARIFICATION     pneumothorax with fibrothorax  ~ 2010   RIGHT/LEFT HEART CATH AND CORONARY ANGIOGRAPHY N/A 10/05/2022   Procedure: RIGHT/LEFT HEART CATH AND CORONARY ANGIOGRAPHY;  Surgeon: Corky Crafts, MD;  Location: [facility] INVASIVE CV LAB;  Service: Cardiovascular;  Laterality: N/A;   TONSILLECTOMY     "as a kid"     Current Outpatient Medications  Medication Instructions   acetaminophen (TYLENOL) 1,000 mg, Oral, Every 8 hours PRN   albuterol (VENTOLIN HFA) 108 (90 Base) MCG/ACT inhaler 2 puffs,  Inhalation, Every 6 hours PRN   amLODipine (NORVASC) 10 mg, Oral, Daily   aspirin 81 mg, Oral, Every morning   atorvastatin (LIPITOR) 40 mg, Oral, Daily at bedtime   bisoprolol (ZEBETA) 10 mg, Oral, Daily   budesonide (PULMICORT) 0.5 mg, Nebulization, 2 times daily   calcium-vitamin D (OSCAL WITH D) 500-200 MG-UNIT tablet 1 tablet, Oral, Every morning   cholecalciferol (VITAMIN D3) 1,000 Units, Oral, Every evening   dapagliflozin propanediol (FARXIGA) 10 mg, Oral, Daily before breakfast   diphenhydramine-acetaminophen (TYLENOL PM) 25-500 MG TABS tablet 1 tablet, Oral, At bedtime PRN   doxycycline (VIBRAMYCIN) 100 mg, Oral, 2 times daily   furosemide (LASIX) 40 MG tablet TAKE 2 TABLETS EVERY MORNING AND 1 TABLET LATE IN THE AFTERNOON   guaiFENesin (MUCINEX) 600 mg, Oral, 2 times daily   ipratropium-albuterol (DUONEB) 0.5-2.5 (3) MG/3ML SOLN 3 mLs, Nebulization, Every 6 hours PRN   Lancets (ONETOUCH ULTRASOFT) lancets Check blood sugars no more than twice daily   Multiple Vitamins-Minerals (MULTIVITAMINS THER. W/MINERALS) TABS tablet 1 tablet, Oral, Daily   nitroGLYCERIN (NITROSTAT) 0.4 mg, Sublingual, Every 5 min x3 PRN   ONETOUCH VERIO test strip USE TO CHECK BLOOD SUGAR NO MORE THAN TWICE A DAY   OXYGEN 4 L/min, Inhalation, Continuous   pantoprazole (PROTONIX) 40 mg, Oral, Daily before breakfast   tamsulosin (FLOMAX) 0.4 mg, Oral, Daily after supper       Objective:   Physical Exam BP 128/72 (BP Location: Right Arm, Patient Position: Sitting, Cuff Size: Normal)   Pulse 92   Temp (!) 97.5 F (36.4 C) (Oral)   Resp 16   Ht 5\' 9"  (1.753 m)   Wt 186 lb 6.4 oz (84.6 kg)   SpO2 (!) 86%   BMI 27.53 kg/m  General:   Well developed, NAD, BMI noted. HEENT:  Normocephalic . Face symmetric, atraumatic Lungs:  Decreased breath sounds Normal respiratory effort, no intercostal retractions, no accessory muscle use. Heart: Central Lower extremities: Right great toe is buddy taped.  No  swelling, no redness or warmness. Has good right pedal pulse. Pretibial skin: Unchanged from when he was seen at the ER (see ER picture) Skin: Not pale. Not jaundice Neurologic:  alert & oriented X3.  Speech normal, gait appropriate for age and unassisted Psych--  Cognition and judgment appear intact.  Cooperative with normal attention span and concentration.  Behavior appropriate. No anxious or depressed appearing.      Assessment   Assessment  DM HTN Hyperlipidemia GERD Anxiety- insomnia  BPH Cardiovascular:Dr Ladona Ridgel  --  Cath x 2 [id number]: no significant CAD -- CHF, non-ischemic  --Atrial fibrillation; h/o AV node ablation, has a Pacemaker (  biventricular pacing) --h/o IC bleeding: Not anticoagulated --Peripheral vascular disease Pulmonary: Dr [name], last OV 10-18-2014, f/u prn --COPD, severe, chronic resp failure, night O2 prn, has portable O2  --Pleural effusion s/p decortication --Pulmonary hypertension  Stasis dermatitis   PLAN: Right great toe fracture: Continue buddy taping Chronic dermatitis: Skin changes at the pretibial area likely chronic dermatitis.  See picture from the ER visit. Elevated BNP: History of CHF, currently with no chest pain or difficulty breathing. COPD: O2 sat was 86%, patient left his oxygen tank at the car.  Asymptomatic, will restart oxygen in few minutes. Osteoporosis ?: See x-ray report from the ER, significant decalcification of the bones, suspect osteoporosis, check DEXA and vitamin D. Hyperthyroidism ?: Recent TSH suppressed, check TFTs.  This may be a contributing factor for osteoporosis. RTC scheduled for next month.

## 2023-02-03 LAB — T3, FREE: T3, Free: 2.9 pg/mL (ref 2.3–4.2)

## 2023-02-03 LAB — T4, FREE: Free T4: 1.09 ng/dL (ref 0.60–1.60)

## 2023-02-03 LAB — TSH: TSH: 1.08 u[IU]/mL (ref 0.35–5.50)

## 2023-02-03 LAB — VITAMIN D 25 HYDROXY (VIT D DEFICIENCY, FRACTURES): VITD: 51.68 ng/mL (ref 30.00–100.00)

## 2023-02-03 NOTE — Assessment & Plan Note (Signed)
Right great toe fracture: Continue buddy taping Chronic dermatitis: Skin changes at the pretibial area likely chronic dermatitis.  See picture from the ER visit. Elevated BNP: History of CHF, currently with no chest pain or difficulty breathing. COPD: O2 sat was 86%, patient left his oxygen tank at the car.  Asymptomatic, will restart oxygen in few minutes. Osteoporosis ?: See x-ray report from the ER, significant decalcification of the bones, suspect osteoporosis, check DEXA and vitamin D. Hyperthyroidism ?: Recent TSH suppressed, check TFTs.  This may be a contributing factor for osteoporosis. RTC scheduled for next month.

## 2023-02-14 ENCOUNTER — Ambulatory Visit: Payer: Medicare Other | Attending: Internal Medicine | Admitting: Internal Medicine

## 2023-02-14 ENCOUNTER — Encounter: Payer: Self-pay | Admitting: Internal Medicine

## 2023-02-14 VITALS — BP 124/68 | HR 84 | Ht 69.0 in | Wt 191.4 lb

## 2023-02-14 DIAGNOSIS — I10 Essential (primary) hypertension: Secondary | ICD-10-CM

## 2023-02-14 DIAGNOSIS — Z950 Presence of cardiac pacemaker: Secondary | ICD-10-CM | POA: Diagnosis not present

## 2023-02-14 DIAGNOSIS — I4891 Unspecified atrial fibrillation: Secondary | ICD-10-CM | POA: Insufficient documentation

## 2023-02-14 NOTE — Progress Notes (Signed)
HPI [name] returns today for ongoing evaluation. He is a pleasant 82 yo man with peristent and uncontrolled atrial fib and LV dysfunction who underwent PPM insertion and AV node ablation 11 years ago. He has done well in the interim except for some COPD. He denies palpitations, sob or chest pressure. No edema. He had no trouble healing after his PM gen change out. He remains oxygen dependent on 4 liters.  Allergies  Allergen Reactions   Hydrocodone Other (See Comments)    "given to him in the hospital; went thru withdrawals once home; dr said not to take it again" (09/27/2012) dizziness   Ultram [Tramadol] Other (See Comments)    "given to him in the hospital; went thru withdrawals once home; dr said not to take it again" (09/27/2012) dizziness   Cialis [Tadalafil] Other (See Comments)    Headache Backache  Dizziness    Avelox [Moxifloxacin Hydrochloride] Itching and Other (See Comments)    Headache Dizziness     Current Outpatient Medications  Medication Sig Dispense Refill   acetaminophen (TYLENOL) 500 MG tablet Take 1,000 mg by mouth every 8 (eight) hours as needed for headache or moderate pain.     albuterol (VENTOLIN HFA) 108 (90 Base) MCG/ACT inhaler Inhale 2 puffs into the lungs every 6 (six) hours as needed for wheezing or shortness of breath. 8.5 g 0   amLODipine (NORVASC) 10 MG tablet TAKE 1 TABLET DAILY 90 tablet 1   aspirin 81 MG tablet Take 81 mg by mouth every morning.      atorvastatin (LIPITOR) 40 MG tablet Take 1 tablet (40 mg total) by mouth at bedtime. 90 tablet 3   bisoprolol (ZEBETA) 10 MG tablet Take 1 tablet (10 mg total) by mouth daily. 90 tablet 1   budesonide (PULMICORT) 0.5 MG/2ML nebulizer solution Take 2 mLs (0.5 mg total) by nebulization 2 (two) times daily. 360 mL 1   calcium-vitamin D (OSCAL WITH D) 500-200 MG-UNIT tablet Take 1 tablet by mouth every morning.     cholecalciferol (VITAMIN D3) 25 MCG (1000 UNIT) tablet Take 1,000 Units by  mouth every evening.     dapagliflozin propanediol (FARXIGA) 10 MG TABS tablet Take 1 tablet (10 mg total) by mouth daily before breakfast. 90 tablet 3   diphenhydramine-acetaminophen (TYLENOL PM) 25-500 MG TABS tablet Take 1 tablet by mouth at bedtime as needed (sleep).     doxycycline (VIBRAMYCIN) 100 MG capsule Take 1 capsule (100 mg total) by mouth 2 (two) times daily. 20 capsule 0   furosemide (LASIX) 40 MG tablet TAKE 2 TABLETS EVERY MORNING AND 1 TABLET LATE IN THE AFTERNOON (Patient taking differently: Take 40 mg by mouth in the morning, at noon, and at bedtime.) 270 tablet 3   guaiFENesin (MUCINEX) 600 MG 12 hr tablet Take 600 mg by mouth 2 (two) times daily.     ipratropium-albuterol (DUONEB) 0.5-2.5 (3) MG/3ML SOLN Take 3 mLs by nebulization every 6 (six) hours as needed. (Patient taking differently: Take 3 mLs by nebulization in the morning and at bedtime.) 360 mL 1   Lancets (ONETOUCH ULTRASOFT) lancets Check blood sugars no more than twice daily 200 each 12   Multiple Vitamins-Minerals (MULTIVITAMINS THER. W/MINERALS) TABS tablet Take 1 tablet by mouth daily. 30 each 5   nitroGLYCERIN (NITROSTAT) 0.4 MG SL tablet Place 1 tablet (0.4 mg total) under the tongue every 5 (five) minutes x 3 doses as needed for chest pain. 25 tablet 3   ONETOUCH  VERIO test strip USE TO CHECK BLOOD SUGAR NO MORE THAN TWICE A DAY 200 strip 12   OXYGEN Inhale 4 L/min into the lungs continuous.     pantoprazole (PROTONIX) 40 MG tablet TAKE 1 TABLET DAILY BEFORE BREAKFAST 90 tablet 1   tamsulosin (FLOMAX) 0.4 MG CAPS capsule Take 1 capsule (0.4 mg total) by mouth daily after supper. 90 capsule 1   No current facility-administered medications for this visit.     Past Medical History:  Diagnosis Date   Abnormal CT scan, chest 2012   CT chest, several lymphadenopathies. Sees pulmonary   Anemia    intermittent   Arthritis    "?back" (09/19/2018)   Atrial fibrillation (HCC)    s/p AV node ablation & BiV PPM  implantation 09/08/11 (op dictation pending)   BPH (benign prostatic hyperplasia)    Saw Dr [name] 2004, normal renal u/s   Bronchitis 08/26/2017   CAD (coronary artery disease)    CHF (congestive heart failure) (HCC)    Thought primarily to be non-systolic although EF down (EF 16-10% 12/2010, down to 35-40% 09/05/11), cath 2008 with no CAD, nuclear study 07/2011 showing Small area of reversibility in the distal ant/lat wall the left ventricle suspicious for ischemia/septal wall HK but felt to be low risk  (per D/C Summary 07/2011)   Chronic bronchitis (HCC)    "get it ~ q yr"   Chronic diastolic heart failure (HCC)         Chronic respiratory failure with hypoxia and hypercapnia (HCC) 09/14/2010   Followed in Pulmonary clinic/ [facility]/ [facility]  Started on 02 2lpm at discharge 09/23/11   - PFT's 10/28/2011  FEV1  1.40 (51%)  with ratio 70 and no better p B2 and DLCO 53 corrects to 101    - PFT's 10/18/2014    FEV1 1.71 (59%) with ratio 68 and no sign change p B2 and DLCO 53 corrects to 85  -  HC03   07/28/20  = 33  -  HCO3   03/17/21     = 31     Heart murmur    HLD (hyperlipidemia)    HTN (hypertension)    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   Pleural effusion 2008   S/p decortication   Pneumonia 08/02/2011   Pulmonary HTN (HCC)    per cath 2008   Type II diabetes mellitus (HCC) 1999    ROS:   All systems reviewed and negative except as noted in the HPI.   Past Surgical History:  Procedure Laterality Date   BI-VENTRICULAR PACEMAKER INSERTION Left 09/08/2011   Procedure: BI-VENTRICULAR PACEMAKER INSERTION (CRT-P);  Surgeon: Marinus Maw, MD;  Location: [facility] CATH LAB;  Service: Cardiovascular;  Laterality: Left;   BIV PACEMAKER GENERATOR CHANGEOUT N/A 11/10/2022   Procedure: BIV PACEMAKER  GENERATOR CHANGEOUT;  Surgeon: Marinus Maw, MD;  Location: [facility] INVASIVE CV LAB;  Service: Cardiovascular;  Laterality: N/A;   CARDIAC CATHETERIZATION     "couple times; never had balloon or stent" (09/19/2018)   CATARACT EXTRACTION W/ INTRAOCULAR LENS  IMPLANT, BILATERAL Bilateral 08/2018   COLONOSCOPY  03/10/11   normal   INSERT / REPLACE / REMOVE PACEMAKER  09/08/11   pacemaker placement   LUNG DECORTICATION  PLEURAL SCARIFICATION     pneumothorax with fibrothorax  ~ 2010   RIGHT/LEFT HEART CATH AND CORONARY ANGIOGRAPHY N/A 10/05/2022   Procedure: RIGHT/LEFT HEART CATH AND CORONARY ANGIOGRAPHY;  Surgeon: Corky Crafts, MD;  Location: [facility] INVASIVE CV LAB;  Service: Cardiovascular;  Laterality: N/A;   TONSILLECTOMY     "as a kid"      Family History  Problem Relation Age of Onset   Diabetes Father    Coronary artery disease Father    Polycythemia Mother    Drug abuse Son    Breast cancer Maternal Aunt    Prostate cancer Neg Hx    Colon cancer Neg Hx      Social History   Socioeconomic History   Marital status: Married    Spouse name: [place]   Number of children: 1   Years of education: Not on file   Highest education level: Not on file  Occupational History   Occupation: retired Publishing rights manager: RETIRED  Tobacco Use   Smoking status: Former    Packs/day: 2.50    Years: 25.00    Additional pack years: 0.00    Total pack years: 62.50    Types: Cigarettes, Cigars    Quit date: 10/11/1980    Years since quitting: 42.3   Smokeless tobacco: Never  Vaping Use   Vaping Use: Never used  Substance and Sexual Activity   Alcohol use: Not Currently    Comment: 1 can of beer ever 4-5 months   Drug use: No   Sexual activity: Not Currently  Other Topics Concern   Not on file  Social History Narrative   Moved from [place]   He lives w/ his wife, [name]   1 son, substance abuse, passed away 10-02-2016         Social Determinants of Health    Financial Resource Strain: Low Risk  (02/16/2022)   Overall Financial Resource Strain (CARDIA)    Difficulty of Paying Living Expenses: Not hard at all  Food Insecurity: No Food Insecurity (10/14/2022)   Hunger Vital Sign    Worried About Running Out of Food in the Last Year: Never true    Ran Out of Food in the Last Year: Never true  Transportation Needs: No Transportation Needs (10/14/2022)   PRAPARE - Administrator, Civil Service (Medical): No    Lack of Transportation (Non-Medical): No  Physical Activity: Insufficiently Active (10/16/2021)   Exercise Vital Sign    Days of Exercise per Week: 5 days    Minutes of Exercise per Session: 10 min  Stress: No Stress Concern Present (04/02/2021)   [employer] of Occupational Health - Occupational Stress Questionnaire    Feeling of Stress : Not at all  Social Connections: Moderately Integrated (04/02/2021)   Social Connection and Isolation Panel [NHANES]    Frequency of Communication with Friends and Family: More than three times a week    Frequency of Social Gatherings with Friends and Family: More than three times a week    Attends Religious Services: More than 4 times per year    Active Member of [employer] or Organizations: No    Attends Banker Meetings: Never    Marital Status: Married  Catering manager Violence: Not At Risk (02-Oct-2022)   Humiliation, Afraid, Rape, and Kick questionnaire    Fear of Current or Ex-Partner: No    Emotionally Abused: No    Physically Abused: No  Sexually Abused: No     BP 124/68   Pulse 84   Ht 5\' 9"  (1.753 m)   Wt 191 lb 6.4 oz (86.8 kg)   SpO2 93%   BMI 28.26 kg/m   Physical Exam:  Well appearing NAD HEENT: Unremarkable Neck:  No JVD, no thyromegally Lymphatics:  No adenopathy Back:  No CVA tenderness Lungs:  Clear with scattered rales HEART:  Regular rate rhythm, no murmurs, no rubs, no clicks Abd:  soft, positive bowel sounds, no organomegally, no rebound, no  guarding Ext:  2 plus pulses, no edema, no cyanosis, no clubbing Skin:  No rashes no nodules Neuro:  CN II through XII intact, motor grossly intact  EKG - atrial fib with biv pacing  DEVICE  Normal device function.  See PaceArt for details.   Assess/Plan:  1. Atrial fib - his rates are well controlled, s/p AV node ablation.  2. BIV PPM - his medtronic biv PPM is working normally. Thresholds are good. 3. HTN - his bp is well controlled. He will maintain a low sodium diet. 4. Chronic diastolic heart failure - his symptoms are class 2. He will continue his current meds.   Sharlot Gowda Falon Flinchum,MD

## 2023-02-14 NOTE — Patient Instructions (Signed)
Medication Instructions:  Your physician recommends that you continue on your current medications as directed. Please refer to the Current Medication list given to you today.  *If you need a refill on your cardiac medications before your next appointment, please call your pharmacy*  Lab Work: None ordered.  If you have labs (blood work) drawn today and your tests are completely normal, you will receive your results only by: MyChart Message (if you have MyChart) OR A paper copy in the mail If you have any lab test that is abnormal or we need to change your treatment, we will call you to review the results.  Testing/Procedures: None ordered.  Follow-Up: At [facility], you and your health needs are our priority.  As part of our continuing mission to provide you with exceptional heart care, we have created designated Provider Care Teams.  These Care Teams include your primary Cardiologist (physician) and Advanced Practice Providers (APPs -  Physician Assistants and Nurse Practitioners) who all work together to provide you with the care you need, when you need it.  We recommend signing up for the patient portal called "MyChart".  Sign up information is provided on this After Visit Summary.  MyChart is used to connect with patients for Virtual Visits (Telemedicine).  Patients are able to view lab/test results, encounter notes, upcoming appointments, etc.  Non-urgent messages can be sent to your provider as well.   To learn more about what you can do with MyChart, go to [email or web address].    Your next appointment:   1 year(s)  The format for your next appointment:   In Person  Provider:   Gregg Taylor, MD{or one of the following Advanced Practice Providers on your designated Care Team:   Renee Ursuy, PA-C Michael "Andy" Tillery, PA-C  Remote monitoring is used to monitor your Pacemaker from home. This monitoring reduces the number of office visits required to check your device to  one time per year. It allows us to keep an eye on the functioning of your device to ensure it is working properly. You are scheduled for a device check from home. You may send your transmission at any time that day. If you have a wireless device, the transmission will be sent automatically. After your physician reviews your transmission, you will receive a postcard with your next transmission date.        

## 2023-02-15 ENCOUNTER — Ambulatory Visit (INDEPENDENT_AMBULATORY_CARE_PROVIDER_SITE_OTHER): Payer: Medicare Other | Admitting: Internal Medicine

## 2023-02-15 ENCOUNTER — Encounter: Payer: Self-pay | Admitting: Internal Medicine

## 2023-02-15 VITALS — BP 122/64 | HR 85 | Temp 98.1°F | Resp 20 | Ht 69.0 in | Wt 189.2 lb

## 2023-02-15 DIAGNOSIS — E1159 Type 2 diabetes mellitus with other circulatory complications: Secondary | ICD-10-CM

## 2023-02-15 DIAGNOSIS — I4891 Unspecified atrial fibrillation: Secondary | ICD-10-CM

## 2023-02-15 LAB — CUP PACEART INCLINIC DEVICE CHECK
Battery Remaining Longevity: 121 mo
Battery Voltage: 3.18 V
Brady Statistic AP VP Percent: 0 %
Brady Statistic AP VS Percent: 0 %
Brady Statistic AS VP Percent: 94.55 %
Brady Statistic AS VS Percent: 5.45 %
Brady Statistic RA Percent Paced: 0 %
Brady Statistic RV Percent Paced: 94.54 %
Date Time Interrogation Session: 20240506153900
Implantable Lead Connection Status: 753985
Implantable Lead Connection Status: 753985
Implantable Lead Implant Date: 20121128
Implantable Lead Implant Date: 20121128
Implantable Lead Location: 753858
Implantable Lead Location: 753860
Implantable Lead Model: 4194
Implantable Lead Model: 5076
Implantable Pulse Generator Implant Date: 20240131
Lead Channel Impedance Value: 3363 Ohm
Lead Channel Impedance Value: 3363 Ohm
Lead Channel Impedance Value: 361 Ohm
Lead Channel Impedance Value: 399 Ohm
Lead Channel Impedance Value: 418 Ohm
Lead Channel Impedance Value: 456 Ohm
Lead Channel Impedance Value: 456 Ohm
Lead Channel Impedance Value: 532 Ohm
Lead Channel Impedance Value: 665 Ohm
Lead Channel Pacing Threshold Amplitude: 0.5 V
Lead Channel Pacing Threshold Amplitude: 0.625 V
Lead Channel Pacing Threshold Amplitude: 1.25 V
Lead Channel Pacing Threshold Amplitude: 1.5 V
Lead Channel Pacing Threshold Pulse Width: 0.4 ms
Lead Channel Pacing Threshold Pulse Width: 0.4 ms
Lead Channel Pacing Threshold Pulse Width: 0.4 ms
Lead Channel Pacing Threshold Pulse Width: 0.4 ms
Lead Channel Sensing Intrinsic Amplitude: 10.75 mV
Lead Channel Sensing Intrinsic Amplitude: 10.75 mV
Lead Channel Setting Pacing Amplitude: 2 V
Lead Channel Setting Pacing Amplitude: 2.5 V
Lead Channel Setting Pacing Pulse Width: 0.4 ms
Lead Channel Setting Pacing Pulse Width: 0.4 ms
Lead Channel Setting Sensing Sensitivity: 4 mV
Zone Setting Status: 755011

## 2023-02-15 NOTE — Progress Notes (Unsigned)
Subjective:    Patient ID: Bradley Sit., male    DOB: Feb 06, 1941, 82 y.o.   MRN: [id number]  DOS:  02/15/2023 Type of visit - description: f/u  Routine follow-up. Reports she is doing great. Had a toe fracture: That seems to be healing well, with no pain, no deformities. Saw Dr. Ladona Ridgel, note reviewed.   Review of Systems See above   Past Medical History:  Diagnosis Date   Abnormal CT scan, chest 2012   CT chest, several lymphadenopathies. Sees pulmonary   Anemia    intermittent   Arthritis    "?back" (09/19/2018)   Atrial fibrillation (HCC)    s/p AV node ablation & BiV PPM implantation 09/08/11 (op dictation pending)   BPH (benign prostatic hyperplasia)    Saw Dr [name] 2004, normal renal u/s   Bronchitis 08/26/2017   CAD (coronary artery disease)    CHF (congestive heart failure) (HCC)    Thought primarily to be non-systolic although EF down (EF 11-91% 12/2010, down to 35-40% 09/05/11), cath 2008 with no CAD, nuclear study 07/2011 showing Small area of reversibility in the distal ant/lat wall the left ventricle suspicious for ischemia/septal wall HK but felt to be low risk  (per D/C Summary 07/2011)   Chronic bronchitis (HCC)    "get it ~ q yr"   Chronic diastolic heart failure (HCC)         Chronic respiratory failure with hypoxia and hypercapnia (HCC) 09/14/2010   Followed in Pulmonary clinic/ [facility]/ [facility]  Started on 02 2lpm at discharge 09/23/11   - PFT's 10/28/2011  FEV1  1.40 (51%)  with ratio 70 and no better p B2 and DLCO 53 corrects to 101    - PFT's 10/18/2014    FEV1 1.71 (59%) with ratio 68 and no sign change p B2 and DLCO 53 corrects to 85  -  HC03   07/28/20  = 33  -  HCO3   03/17/21     = 31     Heart murmur    HLD (hyperlipidemia)    HTN (hypertension)    ICB (intracranial bleed) (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   Pleural effusion 2008   S/p decortication   Pneumonia 08/02/2011   Pulmonary HTN (HCC)    per cath 2008   Type II diabetes mellitus (HCC) 1999    Past Surgical History:  Procedure Laterality Date   BI-VENTRICULAR PACEMAKER INSERTION Left 09/08/2011   Procedure: BI-VENTRICULAR PACEMAKER INSERTION (CRT-P);  Surgeon: Marinus Maw, MD;  Location: [facility] CATH LAB;  Service: Cardiovascular;  Laterality: Left;   BIV PACEMAKER GENERATOR CHANGEOUT N/A 11/10/2022   Procedure: BIV PACEMAKER GENERATOR CHANGEOUT;  Surgeon: Marinus Maw, MD;  Location: [facility] INVASIVE CV LAB;  Service: Cardiovascular;  Laterality: N/A;   CARDIAC CATHETERIZATION     "couple times; never had balloon or stent" (09/19/2018)   CATARACT EXTRACTION W/ INTRAOCULAR LENS  IMPLANT, BILATERAL Bilateral 08/2018   COLONOSCOPY  03/10/11   normal   INSERT / REPLACE / REMOVE PACEMAKER  09/08/11   pacemaker placement   LUNG DECORTICATION     PLEURAL SCARIFICATION     pneumothorax with fibrothorax  ~ 2010   RIGHT/LEFT HEART CATH AND CORONARY ANGIOGRAPHY N/A 10/05/2022  Procedure: RIGHT/LEFT HEART CATH AND CORONARY ANGIOGRAPHY;  Surgeon: Corky Crafts, MD;  Location: [facility] INVASIVE CV LAB;  Service: Cardiovascular;  Laterality: N/A;   TONSILLECTOMY     "as a kid"     Current Outpatient Medications  Medication Instructions   acetaminophen (TYLENOL) 1,000 mg, Oral, Every 8 hours PRN   albuterol (VENTOLIN HFA) 108 (90 Base) MCG/ACT inhaler 2 puffs, Inhalation, Every 6 hours PRN   amLODipine (NORVASC) 10 mg, Oral, Daily   aspirin 81 mg, Oral, Every morning   atorvastatin (LIPITOR) 40 mg, Oral, Daily at bedtime   bisoprolol (ZEBETA) 10 mg, Oral, Daily   budesonide (PULMICORT) 0.5 mg, Nebulization, 2 times daily   calcium-vitamin D (OSCAL WITH D) 500-200 MG-UNIT tablet 1 tablet, Oral, Every morning   cholecalciferol (VITAMIN D3) 1,000 Units, Oral, Every evening   dapagliflozin propanediol  (FARXIGA) 10 mg, Oral, Daily before breakfast   diphenhydramine-acetaminophen (TYLENOL PM) 25-500 MG TABS tablet 1 tablet, Oral, At bedtime PRN   doxycycline (VIBRAMYCIN) 100 mg, Oral, 2 times daily   furosemide (LASIX) 40 MG tablet TAKE 2 TABLETS EVERY MORNING AND 1 TABLET LATE IN THE AFTERNOON   guaiFENesin (MUCINEX) 600 mg, Oral, 2 times daily   ipratropium-albuterol (DUONEB) 0.5-2.5 (3) MG/3ML SOLN 3 mLs, Nebulization, Every 6 hours PRN   Lancets (ONETOUCH ULTRASOFT) lancets Check blood sugars no more than twice daily   Multiple Vitamins-Minerals (MULTIVITAMINS THER. W/MINERALS) TABS tablet 1 tablet, Oral, Daily   nitroGLYCERIN (NITROSTAT) 0.4 mg, Sublingual, Every 5 min x3 PRN   ONETOUCH VERIO test strip USE TO CHECK BLOOD SUGAR NO MORE THAN TWICE A DAY   OXYGEN 4 L/min, Inhalation, Continuous   pantoprazole (PROTONIX) 40 mg, Oral, Daily before breakfast   tamsulosin (FLOMAX) 0.4 mg, Oral, Daily after supper       Objective:   Physical Exam BP 122/64   Pulse 85   Temp 98.1 F (36.7 C) (Oral)   Resp 20   Ht 5\' 9"  (1.753 m)   Wt 189 lb 4 oz (85.8 kg)   SpO2 94% Comment: 4L/min  BMI 27.95 kg/m  General:   Well developed, NAD, BMI noted. HEENT:  Normocephalic . Face symmetric, atraumatic Lungs:  Decreased breath sounds Normal respiratory effort, no intercostal retractions, no accessory muscle use. Heart: Regular (paced) Pretibial skin: Skin is thin and hyper pigmented but with no openings or shallow ulcers. Neurologic:  alert & oriented X3.  Speech normal, gait appropriate for age and unassisted Psych--  Cognition and judgment appear intact.  Cooperative with normal attention span and concentration.  Behavior appropriate. No anxious or depressed appearing.      Assessment     Assessment  DM HTN Hyperlipidemia GERD Anxiety- insomnia  BPH Cardiovascular:Dr Ladona Ridgel  --  Cath x 2 [id number]: no significant CAD -- CHF, non-ischemic  --Atrial fibrillation; h/o AV  node ablation, has a Pacemaker (biventricular pacing) --h/o IC bleeding: Not anticoagulated --Peripheral vascular disease Pulmonary: Dr [name], last OV 10-18-2014, f/u prn --COPD, severe, chronic resp failure, night O2 prn, has portable O2  --Pleural effusion s/p decortication --Pulmonary hypertension  Stasis dermatitis   PLAN: Follow-up from previous visit DM: On Farxiga, check A1c R great toe fracture: Reports area is back to normal, not using buddy tape anymore. Chronic dermatitis: See exam today, skin looks better than previous exam. CHF, A-fib Saw Dr. Ladona Ridgel for A-fib 02/14/2023, noted to be well-controlled, pacemaker working well.  No changes suggested Osteoporosis?  DEXA scheduled for tomorrow RTC 4 to 5  months CPX  4--- Right great toe fracture: Continue buddy taping Chronic dermatitis: Skin changes at the pretibial area likely chronic dermatitis.  See picture from the ER visit. Elevated BNP: History of CHF, currently with no chest pain or difficulty breathing. COPD: O2 sat was 86%, patient left his oxygen tank at the car.  Asymptomatic, will restart oxygen in few minutes. Osteoporosis ?: See x-ray report from the ER, significant decalcification of the bones, suspect osteoporosis, check DEXA and vitamin D. Hyperthyroidism ?: Recent TSH suppressed, check TFTs.  This may be a contributing factor for osteoporosis. RTC scheduled for next month.

## 2023-02-15 NOTE — Patient Instructions (Addendum)
Please bring [place] a copy of your Healthcare Power of Attorney for your chart.   Per our records you are due for your diabetic eye exam. Please contact your eye doctor to schedule an appointment. Please have them send copies of your office visit notes to [place]. Our fax number is [phone]. If you need a referral to an eye doctor please let us know.      GO TO THE LAB : Get the blood work     GO TO THE FRONT DESK, PLEASE SCHEDULE YOUR APPOINTMENTS Come back for a checkup in 4 to 5 months.

## 2023-02-16 LAB — HEMOGLOBIN A1C: Hgb A1c MFr Bld: 6.7 % — ABNORMAL HIGH (ref 4.6–6.5)

## 2023-02-16 LAB — MICROALBUMIN / CREATININE URINE RATIO
Creatinine,U: 65.7 mg/dL
Microalb Creat Ratio: 1.1 mg/g (ref 0.0–30.0)
Microalb, Ur: <0.7 mg/dL (ref 0.0–1.9)

## 2023-02-16 NOTE — Assessment & Plan Note (Signed)
Follow-up from previous visit DM: On Farxiga, check A1c R great toe fracture: Reports area is back to normal, not using buddy tape anymore. Chronic dermatitis: See exam today, pretibial skin looks better than previous exam. CHF, A-fib Saw Dr. Ladona Ridgel for A-fib 02/14/2023, noted to be well-controlled, pacemaker working well.  No changes suggested Osteoporosis?  See LOV, DEXA scheduled for tomorrow RTC 4 to 5 months CPX

## 2023-02-17 ENCOUNTER — Telehealth: Payer: Medicare Other

## 2023-02-17 ENCOUNTER — Ambulatory Visit (HOSPITAL_BASED_OUTPATIENT_CLINIC_OR_DEPARTMENT_OTHER)
Admission: RE | Admit: 2023-02-17 | Discharge: 2023-02-17 | Disposition: A | Discharge status: Home / Self Care | Payer: Medicare Other | Source: Ambulatory Visit | Attending: Internal Medicine | Admitting: Internal Medicine

## 2023-02-17 DIAGNOSIS — M810 Age-related osteoporosis without current pathological fracture: Secondary | ICD-10-CM | POA: Diagnosis present

## 2023-02-17 NOTE — Telephone Encounter (Signed)
Please run benefits for Evenity.  New start.

## 2023-02-17 NOTE — Telephone Encounter (Signed)
Please start Evenity process.

## 2023-02-21 ENCOUNTER — Other Ambulatory Visit (HOSPITAL_BASED_OUTPATIENT_CLINIC_OR_DEPARTMENT_OTHER): Payer: Self-pay | Admitting: Cardiovascular Disease

## 2023-02-21 NOTE — Telephone Encounter (Signed)
Yes, lets try Prolia

## 2023-02-21 NOTE — Telephone Encounter (Signed)
Rx request sent to pharmacy.  

## 2023-02-21 NOTE — Telephone Encounter (Signed)
Bradley [name] is only available for women. Please advise.

## 2023-02-21 NOTE — Telephone Encounter (Signed)
Would you like to try Prolia?

## 2023-02-21 NOTE — Telephone Encounter (Signed)
Can we please run for Prolia?

## 2023-02-22 ENCOUNTER — Other Ambulatory Visit (HOSPITAL_COMMUNITY): Payer: Self-pay

## 2023-02-22 ENCOUNTER — Telehealth: Payer: Self-pay

## 2023-02-22 NOTE — Telephone Encounter (Signed)
Prolia VOB initiated via [email or web address] 

## 2023-02-22 NOTE — Telephone Encounter (Signed)
Created new encounter for Prolia BIV. Will route encounter back once benefit verification is complete. 

## 2023-02-25 NOTE — Telephone Encounter (Signed)
Pt ready for scheduling for PROLIA on or after : 02/25/23  Out-of-pocket cost due at time of visit: $0  Primary: MEDICARE Prolia co-insurance: 0% Admin fee co-insurance: 0%  Secondary: [place] [employer] [place] Prolia co-insurance:  Admin fee co-insurance:   Medical Benefit Details: Date Benefits were checked: 02/25/23 Deductible: $240 met of $240 required/ Coinsurance: 0%/ Admin Fee: 0%  Prior Auth: N/A PA# Expiration Date:    Pharmacy benefit: Copay $-- If patient wants fill through the pharmacy benefit please send prescription to:  --- , and include estimated need by date in rx notes. Pharmacy will ship medication directly to the office.  Patient not eligible for Prolia Copay Card. Copay Card can make patient's cost as little as $25. Link to apply: https://www.amgensupportplus.com/copay  ** This summary of benefits is an estimation of the patient's out-of-pocket cost. Exact cost may very based on individual plan coverage.

## 2023-03-08 NOTE — Telephone Encounter (Signed)
Pt scheduled for 03/14/23 at 4pm.  Pts wife has appointment at that time.  Appt approved by Selena Batten.

## 2023-03-14 ENCOUNTER — Encounter (HOSPITAL_BASED_OUTPATIENT_CLINIC_OR_DEPARTMENT_OTHER): Payer: Self-pay | Admitting: Cardiovascular Disease

## 2023-03-14 ENCOUNTER — Ambulatory Visit (INDEPENDENT_AMBULATORY_CARE_PROVIDER_SITE_OTHER): Payer: Medicare Other

## 2023-03-14 ENCOUNTER — Telehealth: Payer: Self-pay

## 2023-03-14 ENCOUNTER — Ambulatory Visit (INDEPENDENT_AMBULATORY_CARE_PROVIDER_SITE_OTHER): Payer: Medicare Other | Admitting: Cardiovascular Disease

## 2023-03-14 VITALS — BP 117/74 | HR 83 | Ht 69.0 in | Wt 182.6 lb

## 2023-03-14 DIAGNOSIS — I10 Essential (primary) hypertension: Secondary | ICD-10-CM

## 2023-03-14 DIAGNOSIS — I5032 Chronic diastolic (congestive) heart failure: Secondary | ICD-10-CM

## 2023-03-14 DIAGNOSIS — I2510 Atherosclerotic heart disease of native coronary artery without angina pectoris: Secondary | ICD-10-CM

## 2023-03-14 DIAGNOSIS — M810 Age-related osteoporosis without current pathological fracture: Secondary | ICD-10-CM

## 2023-03-14 DIAGNOSIS — I272 Pulmonary hypertension, unspecified: Secondary | ICD-10-CM | POA: Diagnosis not present

## 2023-03-14 MED ORDER — DENOSUMAB 60 MG/ML ~~LOC~~ SOSY
60.0000 mg | PREFILLED_SYRINGE | Freq: Once | SUBCUTANEOUS | Status: AC
Start: 2023-03-14 — End: 2023-03-14
  Administered 2023-03-14: 60 mg via SUBCUTANEOUS

## 2023-03-14 NOTE — Telephone Encounter (Signed)
Pt due 09/13/23

## 2023-03-14 NOTE — Telephone Encounter (Signed)
-----   Message from Wilford Corner, CMA sent at 03/14/2023  3:51 PM EDT ----- Patient had prolia injection today. Windell Moulding CMA

## 2023-03-14 NOTE — Progress Notes (Addendum)
Bradley [name] Sr. is a 82 y.o. male presents to the office today for prolia injections, per physician's orders. Original order: Per telephone note 02/17/23 "lets try prolia" per [name].  Prolia (med), 60 mg/ml (dose),  subcutaneous (route) was administered left arm (location) today. Patient tolerated injection.  Patient next injection due: in 6 months, appt made No. He will be contacted once benefits check in 6 months.   Wilford Corner

## 2023-03-14 NOTE — Patient Instructions (Signed)
Medication Instructions:  Your physician recommends that you continue on your current medications as directed. Please refer to the Current Medication list given to you today.  *If you need a refill on your cardiac medications before your next appointment, please call your pharmacy*  Lab Work: NONE  Testing/Procedures: NONE   Follow-Up: At [facility], you and your health needs are our priority.  As part of our continuing mission to provide you with exceptional heart care, we have created designated Provider Care Teams.  These Care Teams include your primary Cardiologist (physician) and Advanced Practice Providers (APPs -  Physician Assistants and Nurse Practitioners) who all work together to provide you with the care you need, when you need it.  We recommend signing up for the patient portal called "MyChart".  Sign up information is provided on this After Visit Summary.  MyChart is used to connect with patients for Virtual Visits (Telemedicine).  Patients are able to view lab/test results, encounter notes, upcoming appointments, etc.  Non-urgent messages can be sent to your provider as well.   To learn more about what you can do with MyChart, go to [email or web address].    Your next appointment:   6 month(s)  The format for your next appointment:   In Person  Provider:   Tiffany Crary, MD          

## 2023-03-14 NOTE — Progress Notes (Signed)
Cardiology Office Note  Date:  03/14/2023   ID:  Bradley Searles Sr., DOB Nov 09, 1940, MRN [id number]  PCP:  [name], MD  Cardiologist:  [name], MD  No chief complaint on file.    History of Present Illness: Bradley FERSON Sr. is a 82 y.o. male with chronic atrial fibrillation s/p AVN ablation and CRT-P, chronic diastolic heart failure, mild aortic stenosis, CAD, hypertension, hyperlipidemia, prior ICH (no anticoagulation), COPD, and PAD who presents for follow-up.  He previously underwent left heart catheterization in 2005 and in 2018 and had no significant coronary artery disease. He had a BiV pacemaker and AV nodal ablation 09/08/11. At that time he had reduced systolic function in the setting of atrial fibrillation with RVR. After placement of the biventricular pacemaker his ejection fraction improved to 50-55% in 2017.  He was admitted 02/2017 with COPD exacerbation and acute on chronic diastolic heart failure. BNP was 491 and TEE had pleural effusions on chest x-ray. He improved with diuresis. Echocardiogram at that time revealed LVEF 55-60%, mild mitral regurgitation, and PA SP 55 mmHg.  He followed up with [name] on 03/09/17 and was doing well.  Bradley [name] was admitted 12/2018 with a COPD exacerbation  He developed increasing shortness of breath and cough.  In the hospital he required BiPAP, IV steroids and antibiotics.  He was again admitted 04/2019 with acute respiratory failure due to both COPD exacerbation and acute on chronic diastolic heart failure.  He followed up with [name], PA-C on 05/2019 and his weight was stable at 206 pounds.    Bradley [name] was admitted 09/2019 with a COPD exacerbation requiring antibiotics and steroids.  He had an echo 08/2020 that revealed LVEF 55 to 60% with mild LVH.  Right ventricular function was normal.  PASP was 50.6 mmHg.  He was then admitted 03/2021 with COPD exacerbation and acute on chronic diastolic heart failure.  Since discharge he had  been feeling well.    On 07/29/2021 he was admitted with acute on chronic respiratory failure with hypoxia, acute on chronic systolic CHF, and COPD with acute exacerbation. While in the hospital he was seen by Heart Failure. He was treated with steroids and IV Lasix. He followed up with Heart Failure on 11/22 and was doing well. Bradley [name] was added to his regimen. Bradley [name] was switched from losartan to [facility].  For unclear reasons [name] was discontinued during one of his hospitalizations.  He was admitted 09/2022 with COVID-19 pneumonia.  His device was near EOL and replaced 10/2022.   Past Medical History:  Diagnosis Date   Abnormal CT scan, chest 2012   CT chest, several lymphadenopathies. Sees pulmonary   Anemia    intermittent   Arthritis    "?back" (09/19/2018)   Atrial fibrillation (HCC)    s/p AV node ablation & BiV PPM implantation 09/08/11 (op dictation pending)   BPH (benign prostatic hyperplasia)    Saw Dr [name] 2004, normal renal u/s   Bronchitis 08/26/2017   CAD (coronary artery disease)    CHF (congestive heart failure) (HCC)    Thought primarily to be non-systolic although EF down (EF 40-98% 12/2010, down to 35-40% 09/05/11), cath 2008 with no CAD, nuclear study 07/2011 showing Small area of reversibility in the distal ant/lat wall the left ventricle suspicious for ischemia/septal wall HK but felt to be low risk  (per D/C Summary 07/2011)   Chronic bronchitis (HCC)    "get it ~ q yr"  Chronic diastolic heart failure (HCC)         Chronic respiratory failure with hypoxia and hypercapnia (HCC) 09/14/2010   Followed in Pulmonary clinic/ [facility]/ [place]  Started on 02 2lpm at discharge 09/23/11   - PFT's 10/28/2011  FEV1  1.40 (51%)  with ratio 70 and no better p B2 and DLCO 53 corrects to 101    - PFT's 10/18/2014    FEV1 1.71 (59%) with ratio 68 and no sign change p B2 and DLCO 53 corrects to 85  -  HC03   07/28/20  = 33  -  HCO3   03/17/21     = 31     Heart murmur     HLD (hyperlipidemia)    HTN (hypertension)    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   Pleural effusion 2008   S/p decortication   Pneumonia 08/02/2011   Pulmonary HTN (HCC)    per cath 2008   Type II diabetes mellitus (HCC) 1999    Past Surgical History:  Procedure Laterality Date   BI-VENTRICULAR PACEMAKER INSERTION Left 09/08/2011   Procedure: BI-VENTRICULAR PACEMAKER INSERTION (CRT-P);  Surgeon: Marinus Maw, MD;  Location: [facility] CATH LAB;  Service: Cardiovascular;  Laterality: Left;   BIV PACEMAKER GENERATOR CHANGEOUT N/A 11/10/2022   Procedure: BIV PACEMAKER GENERATOR CHANGEOUT;  Surgeon: Marinus Maw, MD;  Location: [facility] INVASIVE CV LAB;  Service: Cardiovascular;  Laterality: N/A;   CARDIAC CATHETERIZATION     "couple times; never had balloon or stent" (09/19/2018)   CATARACT EXTRACTION W/ INTRAOCULAR LENS  IMPLANT, BILATERAL Bilateral 08/2018   COLONOSCOPY  03/10/11   normal   INSERT / REPLACE / REMOVE PACEMAKER  09/08/11   pacemaker placement   LUNG DECORTICATION     PLEURAL SCARIFICATION     pneumothorax with fibrothorax  ~ 2010   RIGHT/LEFT HEART CATH AND CORONARY ANGIOGRAPHY N/A 10/05/2022   Procedure: RIGHT/LEFT HEART CATH AND CORONARY ANGIOGRAPHY;  Surgeon: Corky Crafts, MD;  Location: [facility] INVASIVE CV LAB;  Service: Cardiovascular;  Laterality: N/A;   TONSILLECTOMY     "as a kid"      Current Outpatient Medications  Medication Sig Dispense Refill   acetaminophen (TYLENOL) 500 MG tablet Take 1,000 mg by mouth every 8 (eight) hours as needed for headache or moderate pain.     albuterol (VENTOLIN HFA) 108 (90 Base) MCG/ACT inhaler Inhale 2 puffs into the lungs every 6 (six) hours as needed for wheezing or shortness of breath. 8.5 g 0   amLODipine (NORVASC) 10 MG tablet TAKE 1  TABLET DAILY 90 tablet 1   aspirin 81 MG tablet Take 81 mg by mouth every morning.      atorvastatin (LIPITOR) 40 MG tablet Take 1 tablet (40 mg total) by mouth at bedtime. 90 tablet 3   bisoprolol (ZEBETA) 10 MG tablet Take 1 tablet (10 mg total) by mouth daily. 90 tablet 1   budesonide (PULMICORT) 0.5 MG/2ML nebulizer solution Take 2 mLs (0.5 mg total) by nebulization 2 (two) times daily. 360 mL 1   calcium-vitamin D (OSCAL WITH D) 500-200 MG-UNIT tablet Take 1 tablet by mouth every morning.     cholecalciferol (VITAMIN D3) 25 MCG (1000 UNIT) tablet Take 1,000 Units  by mouth every evening.     dapagliflozin propanediol (FARXIGA) 10 MG TABS tablet Take 1 tablet (10 mg total) by mouth daily before breakfast. 90 tablet 3   diphenhydramine-acetaminophen (TYLENOL PM) 25-500 MG TABS tablet Take 1 tablet by mouth at bedtime as needed (sleep).     furosemide (LASIX) 40 MG tablet TAKE 2 TABLETS EVERY MORNING AND 1 TABLET LATE IN THE AFTERNOON 270 tablet 1   guaiFENesin (MUCINEX) 600 MG 12 hr tablet Take 600 mg by mouth 2 (two) times daily.     ipratropium-albuterol (DUONEB) 0.5-2.5 (3) MG/3ML SOLN Take 3 mLs by nebulization every 6 (six) hours as needed. (Patient taking differently: Take 3 mLs by nebulization in the morning and at bedtime.) 360 mL 1   Lancets (ONETOUCH ULTRASOFT) lancets Check blood sugars no more than twice daily 200 each 12   Multiple Vitamins-Minerals (MULTIVITAMINS THER. W/MINERALS) TABS tablet Take 1 tablet by mouth daily. 30 each 5   nitroGLYCERIN (NITROSTAT) 0.4 MG SL tablet Place 1 tablet (0.4 mg total) under the tongue every 5 (five) minutes x 3 doses as needed for chest pain. 25 tablet 3   ONETOUCH VERIO test strip USE TO CHECK BLOOD SUGAR NO MORE THAN TWICE A DAY 200 strip 12   OXYGEN Inhale 4 L/min into the lungs continuous.     pantoprazole (PROTONIX) 40 MG tablet TAKE 1 TABLET DAILY BEFORE BREAKFAST 90 tablet 1   tamsulosin (FLOMAX) 0.4 MG CAPS capsule Take 1 capsule (0.4 mg  total) by mouth daily after supper. 90 capsule 1   zolpidem (AMBIEN) 10 MG tablet Take 10 mg by mouth at bedtime as needed for sleep.     No current facility-administered medications for this visit.    Allergies:   Hydrocodone, Ultram [tramadol], Cialis [tadalafil], and Avelox [moxifloxacin hydrochloride]    Social History:  The patient  reports that he quit smoking about 42 years ago. His smoking use included cigarettes and cigars. He has a 62.50 pack-year smoking history. He has never used smokeless tobacco. He reports that he does not currently use alcohol. He reports that he does not use drugs.   Family History:  The patient's family history includes Breast cancer in his maternal aunt; Coronary artery disease in his father; Diabetes in his father; Drug abuse in his son; Polycythemia in his mother.    ROS:   Please see the history of present illness. All other systems are reviewed and negative.    PHYSICAL EXAM: VS:  BP 117/74 (BP Location: Right Arm, Patient Position: Sitting, Cuff Size: Normal)   Pulse 83   Ht 5\' 9"  (1.753 m)   Wt 182 lb 9.6 oz (82.8 kg)   SpO2 92%   BMI 26.97 kg/m  , BMI Body mass index is 26.97 kg/m. GENERAL:  Well appearing HEENT: Pupils equal round and reactive, fundi not visualized, oral mucosa unremarkable.  Edentulous.   NECK:  No jugular venous distention, waveform within normal limits, carotid upstroke brisk and symmetric, no bruits LUNGS:  Diminished at the bases.  Poor air movement.  HEART:  Irregularly irregular.  PMI not displaced or sustained,S1 and S2 within normal limits, no S3, no S4, no clicks, no rubs, no murmurs ABD:  Flat, positive bowel sounds normal in frequency in pitch, no bruits, no rebound, no guarding, no midline pulsatile mass, no hepatomegaly, no splenomegaly EXT:  2 plus pulses throughout, Trace bilateral ankle edema, no cyanosis no clubbing SKIN:  No rashes no nodules NEURO:  Cranial nerves II  through XII grossly intact, motor  grossly intact throughout [facility]:  Cognitively intact, oriented to person place and time   EKG:  09/13/22: EKG was not ordered. 03/09/22: BiV pace.  Rate 88 bpm 09/17/2021: EKG was not ordered. 06/03/2021: Atrial fibrillation.  Ventricular paced 73 bpm. 11/28/20: VP.  Rate 82 bpm.   08/26/17: VP.  Rate 82 bpm.   05/26/17: atrial fibrillation.  Ventricular paced at 75 bpm  Echo 05/26/18: Study Conclusions   - Left ventricle: The cavity size was normal. Wall thickness was   normal. Systolic function was normal. The estimated ejection   fraction was in the range of 55% to 60%. Although no diagnostic   regional wall motion abnormality was identified, this possibility   cannot be completely excluded on the basis of this study. The   study was not technically sufficient to allow evaluation of LV   diastolic dysfunction due to atrial fibrillation. - Aortic valve: There was no stenosis. - Mitral valve: Moderately calcified annulus. There was trivial   regurgitation. - Left atrium: The atrium was mildly dilated. - Right ventricle: The cavity size was moderately dilated. Pacer   wire or catheter noted in right ventricle. Systolic function was   normal. - Right atrium: The atrium was severely dilated. - Tricuspid valve: Peak RV-RA gradient (S): 32 mm Hg. - Pulmonary arteries: PA peak pressure: 40 mm Hg (S). - Systemic veins: IVC measured 2.2 cm with > 50% respirophasic   variation, suggesting RA pressure 8 mmHg.   Impressions:   - The patient appeared to be in atrial fibrillation. Normal LV size   and systolic function, EF 55-60%. Moderately dilated RV with   normal systolic function. Mild pulmonary hypertension. Biatrial   enlargement. No significant valvular abnormalities.  Recent Labs: 10/04/2022: Magnesium 2.5 01/24/2023: ALT 12; B Natriuretic Peptide 333.7; BUN 17; Creatinine, Ser 1.00; Hemoglobin 13.0; Platelets 210; Potassium 3.9; Sodium 137 02/02/2023: TSH 1.08    Lipid Panel     Component Value Date/Time   CHOL 142 11/15/2022 0931   TRIG 76 11/15/2022 0931   HDL 54 11/15/2022 0931   CHOLHDL 2.6 11/15/2022 0931   CHOLHDL 3.1 10/07/2022 0506   VLDL 13 10/07/2022 0506   LDLCALC 73 11/15/2022 0931   LDLDIRECT 76 11/15/2022 0928    Wt Readings from Last 3 Encounters:  03/14/23 182 lb 9.6 oz (82.8 kg)  02/15/23 189 lb 4 oz (85.8 kg)  02/14/23 191 lb 6.4 oz (86.8 kg)     ASSESSMENT AND PLAN:  Assessment and Plan    # Non-obstructive CAD:  # Hyperlipidemia: He has no ischemic symptoms.  Encouraged him to keep exercising as tolerated.  -Continue bisoprolol, aspirin, atorvastatin.  # Diabetes: A1c 6.7, within target range. -Continue current management plan.   # Persistent atrial fibrillation: # s/p AVN ablation with CRT-P: He follows with Dr. Ladona Ridgel.  Recent generator change out doing well.   # HFpEF: # Hypertension:  # Hypoxic and hypercarbic respiratory failure: Multiple admissions for acute on chronic HF and COPD exacerbations.  Today he appears euvolemic and BP stable. Continue amlodipine, bisoprolol, Farxiga        Current medicines are reviewed at length with the patient today.  The patient does not have concerns regarding medicines.  The following changes have been made:  Labs/ tests ordered today include:   No orders of the defined types were placed in this encounter.    Disposition:   FU with Bralynn Donado C. Duke Salvia, MD, [facility] in 3 months.  Signed, Elizabth Palka C. Duke Salvia, MD, [facility]  03/14/2023 9:16 AM    [facility]

## 2023-03-28 ENCOUNTER — Other Ambulatory Visit: Payer: Self-pay

## 2023-03-28 ENCOUNTER — Ambulatory Visit: Payer: Medicare Other | Admitting: Pharmacist

## 2023-03-28 DIAGNOSIS — E1159 Type 2 diabetes mellitus with other circulatory complications: Secondary | ICD-10-CM

## 2023-03-28 DIAGNOSIS — I10 Essential (primary) hypertension: Secondary | ICD-10-CM

## 2023-03-28 DIAGNOSIS — I5032 Chronic diastolic (congestive) heart failure: Secondary | ICD-10-CM

## 2023-03-28 DIAGNOSIS — J449 Chronic obstructive pulmonary disease, unspecified: Secondary | ICD-10-CM

## 2023-03-28 MED ORDER — ONETOUCH VERIO VI STRP: ORAL_STRIP | 12 refills | Status: DC

## 2023-03-28 NOTE — Progress Notes (Signed)
Pharmacy Note  03/28/2023 Name: Bradley PADDOCK Sr. MRN: [id number] DOB: 05-Jul-1941  Subjective: Bradley Searles Sr. is a 82 y.o. year old male who is a primary care patient of [name], MD. Clinical Pharmacist Practitioner referral was placed to assist with medication management.    Engaged with patient by telephone for follow up visit today.  Recent office and hospital visits:  01/24/2023 - ED Visit for cellulitis. Prescribed doxycycline 100mg  twice a day.  02/02/2023 - PCP - follow up cellulitis and elevated BNP after ED visit. Noted great toe fracture - buddy taped. ER xray noted suspect osteoporosis. Ordered DEXA and vitamin D.  02/14/2023 - Cardio (Dr Ladona Ridgel) seen for Afib. No changes. Noted.  02/15/2023 - PCP (Dr Drue Novel) follow up - no med changes noted.  05/09/224 - Evenity not covered - only approved for postmenopausal osteoporosis. Changed to Prolia 02/22/2023 - Prolia cost $0 out of pocket for patient. Scheduled for injection 03/14/2023 03/14/2023 received Prolia  COPD -  Current therapy: budesonide nebs twice a day and Duonebs as needed.  O2 setting is 4L  Reports no concerns with breathing.  Hypertension / CHF -  Current therapy: amlodipine 10mg  daily, furosemide 40mg  twice a day, Farxiga 10mg  daily and bisoprolol 10mg  daily.  Checking blood pressure at home 2 or 3 times per day. He reports he does not write down his blood pressure readings but states blood pressure usually 118 to 128 / 70's  BP Readings from Last 3 Encounters:  03/14/23 117/74  02/15/23 122/64  02/14/23 124/68    Patient is checking weight daily. Reports weight has been stable. He endorsed that is knows to contact office if has weight gain more than 3 lbs in 24 hours or 5lbs in 1 week or if he is having symptoms of worsening heart failure.   Hyperlipidemia:  Current therapy: atorvastatin 40mg  daily.  Dose of atorvastatin was increased about 3 or 4 months ago. LDL decreased from 108 (10/19/2022) to 76  (11/15/2022).   Osteoporosis:  Current therapy - prolia 60mg  every 6 months. Last dose was 03/14/2023 Calcium + D once daily and MVI once daily   Dr Drue Novel initially recommended Evenity x 12 months but was denied.   DEXA 02/17/2023 - Forearm Radius 33% T-score of -4.1  Lumbar spine was not utilized due to advanced degenerative changes.  DualFemur Total Mean  -2.7  Left Forearm Radius 33% 02/17/2023 82.0 Osteoporosis -4.1 0.584  Objective: Review of patient status, including review of consultants reports, laboratory and other test data, was performed as part of comprehensive evaluation and provision of chronic care management services.   Lab Results  Component Value Date   CREATININE 1.00 01/24/2023   CREATININE 0.79 11/15/2022   CREATININE 0.74 11/10/2022    Lab Results  Component Value Date   HGBA1C 6.7 (H) 02/15/2023       Component Value Date/Time   CHOL 142 11/15/2022 0931   TRIG 76 11/15/2022 0931   HDL 54 11/15/2022 0931   CHOLHDL 2.6 11/15/2022 0931   CHOLHDL 3.1 10/07/2022 0506   VLDL 13 10/07/2022 0506   LDLCALC 73 11/15/2022 0931   LDLDIRECT 76 11/15/2022 0928   Lab Results  Component Value Date   CHOL 142 11/15/2022   CHOL 180 10/07/2022   CHOL 141 11/11/2021   Lab Results  Component Value Date   HDL 54 11/15/2022   HDL 58 10/07/2022   HDL 41.10 11/11/2021   Lab Results  Component Value Date  LDLCALC 73 11/15/2022   LDLCALC 109 (H) 10/07/2022   LDLCALC 70 11/11/2021   Lab Results  Component Value Date   TRIG 76 11/15/2022   TRIG 67 10/07/2022   TRIG 148.0 11/11/2021   Lab Results  Component Value Date   CHOLHDL 2.6 11/15/2022   CHOLHDL 3.1 10/07/2022   CHOLHDL 3 11/11/2021   Lab Results  Component Value Date   LDLDIRECT 76 11/15/2022   LDLDIRECT 108 (H) 10/19/2022     Clinical ASCVD: Yes  The ASCVD Risk score (Arnett DK, et al., 2019) failed to calculate for the following reasons:   The 2019 ASCVD risk score is only valid for  ages 16 to 54   The patient has a prior MI or stroke diagnosis    BP Readings from Last 3 Encounters:  03/14/23 117/74  02/15/23 122/64  02/14/23 124/68     Allergies  Allergen Reactions   Hydrocodone Other (See Comments)    "given to him in the hospital; went thru withdrawals once home; dr said not to take it again" (09/27/2012) dizziness   Ultram [Tramadol] Other (See Comments)    "given to him in the hospital; went thru withdrawals once home; dr said not to take it again" (09/27/2012) dizziness   Cialis [Tadalafil] Other (See Comments)    Headache Backache  Dizziness    Avelox [Moxifloxacin Hydrochloride] Itching and Other (See Comments)    Headache Dizziness    Medications Reviewed Today     Reviewed by Henrene Pastor, RPH-CPP (Pharmacist) on 03/28/23 at 1107  Med List Status: <None>   Medication Order Taking? Sig Documenting Provider Last Dose Status Informant  acetaminophen (TYLENOL) 500 MG tablet [id number] Yes Take 1,000 mg by mouth every 8 (eight) hours as needed for headache or moderate pain. [provider] Taking Active Self  albuterol (VENTOLIN HFA) 108 (90 Base) MCG/ACT inhaler [id number] Yes Inhale 2 puffs into the lungs every 6 (six) hours as needed for wheezing or shortness of breath. Maretta Bees, MD Taking Active Self  amLODipine (NORVASC) 10 MG tablet [id number] Yes TAKE 1 TABLET DAILY [name], MD Taking Active   aspirin 81 MG tablet [id number] Yes Take 81 mg by mouth every morning.  [provider] Taking Active Self  atorvastatin (LIPITOR) 40 MG tablet [id number] Yes Take 1 tablet (40 mg total) by mouth at bedtime. Alver Sorrow, NP Taking Active            Med Note Anselm Pancoast R   Wed Nov 10, 2022  1:24 PM)    bisoprolol (ZEBETA) 10 MG tablet [id number] Yes Take 1 tablet (10 mg total) by mouth daily. [name], MD Taking Active   budesonide (PULMICORT) 0.5 MG/2ML nebulizer solution [id number] Yes Take 2 mLs (0.5 mg total) by  nebulization 2 (two) times daily. [name], MD Taking Active Self  calcium-vitamin D (OSCAL WITH D) 500-200 MG-UNIT tablet [id number] Yes Take 1 tablet by mouth every morning. [provider] Taking Active Self  cholecalciferol (VITAMIN D3) 25 MCG (1000 UNIT) tablet [id number] Yes Take 1,000 Units by mouth every evening. [provider] Taking Active Self  dapagliflozin propanediol (FARXIGA) 10 MG TABS tablet [id number] Yes Take 1 tablet (10 mg total) by mouth daily before breakfast. [name], MD Taking Active   diphenhydramine-acetaminophen (TYLENOL PM) 25-500 MG TABS tablet [id number]  Take 1 tablet by mouth at bedtime as needed (sleep). [provider]  Active Self  furosemide (LASIX) 40 MG tablet  [id number] Yes TAKE 2 TABLETS EVERY MORNING AND 1 TABLET LATE IN THE AFTERNOON [name], MD Taking Active   guaiFENesin (MUCINEX) 600 MG 12 hr tablet [id number] Yes Take 600 mg by mouth 2 (two) times daily. [provider] Taking Active Self  ipratropium-albuterol (DUONEB) 0.5-2.5 (3) MG/3ML SOLN [id number] Yes Take 3 mLs by nebulization every 6 (six) hours as needed.  Patient taking differently: Take 3 mLs by nebulization in the morning and at bedtime.   [name], MD Taking Active Self  Lancets [facility] ULTRASOFT) lancets [id number] Yes Check blood sugars no more than twice daily [name], MD Taking Active Self  Multiple Vitamins-Minerals (MULTIVITAMINS THER. W/MINERALS) TABS tablet [id number] Yes Take 1 tablet by mouth daily. [name], MD Taking Active Self  nitroGLYCERIN (NITROSTAT) 0.4 MG SL tablet [id number] Yes Place 1 tablet (0.4 mg total) under the tongue every 5 (five) minutes x 3 doses as needed for chest pain. [name], MD Taking Active Self           Med Note Loreli Dollar, [name]   Tue Feb 15, 2023  2:11 PM) PRN  Lum Babe test strip [id number] Yes USE TO CHECK BLOOD SUGAR NO MORE THAN TWICE A DAY [name], MD Taking Active Self  OXYGEN  [id number] Yes Inhale 4 L/min into the lungs continuous. [provider] Taking Active   pantoprazole (PROTONIX) 40 MG tablet [id number] Yes TAKE 1 TABLET DAILY BEFORE BREAKFAST Paz, Nolon Rod, MD Taking Active   tamsulosin [facility]) 0.4 MG CAPS capsule [id number] Yes Take 1 capsule (0.4 mg total) by mouth daily after supper. [name], MD Taking Active   zolpidem (AMBIEN) 10 MG tablet [id number]  Take 10 mg by mouth at bedtime as needed for sleep. [provider]  Active             Patient Active Problem List   Diagnosis Date Noted   Acute on chronic congestive heart failure (HCC) 10/05/2022   Acute respiratory failure with hypoxemia (HCC) 08/27/2022   COVID 08/23/2022   Displaced fracture of shaft of third metacarpal bone, left hand, sub encounter for closed fracture 08/03/2022   ILD (interstitial lung disease) (HCC) 07/30/2022   CAD (coronary artery disease) 07/19/2022   Acute on chronic respiratory failure with hypoxia (HCC) 07/18/2022   GERD without esophagitis 07/30/2021   Cor pulmonale (chronic) (HCC) 08/05/2020   COPD exacerbation (HCC) 12/22/2018   COPD,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 12/22/2018   PCP NOTES >>> 07/15/2015   Annual physical exam 05/08/2012   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   Sinoatrial node dysfunction (HCC)    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   Chronic diastolic heart failure (HCC)    PAD (peripheral artery disease) (HCC) 10/22/2010   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  Patient affirms current coverage meets needs.   Assessment / Plan:  Hypertension / CHF: goal blood pressure < 130/80 Continue bisoprolol 10mg  daily and amlodipine 10mg  daily for blood pressure - monitor for edema Continue Farxiga and furosemide  for CHF Continue to check weight daily Continue to limit intake of sodium in diet.  Check blood pressure 2 to 3 times per week and record (can record on same sheet as weight)   COPD:  Continue budesonide and DuoNeb nebs   Hyperlipidemia: goal LDL < 70 Continue to take atorvastatin 40mg  daily  Osteoporosis:  Continue Prolia 60mg  every 6 months. Next dose due around 09/13/2023 Continue Calcium + D once daily and MVI once daily   Medication Management:  Reviewed med list and updated Reviewed refill history and adherence. Reminded that [name] will be due to be refilled soon.   Follow Up:  Telephone follow up appointment with care management team member scheduled for:  2 to 3 months   Henrene Pastor, PharmD Clinical Pharmacist [facility]  - [facility] [phone]

## 2023-04-08 ENCOUNTER — Ambulatory Visit (INDEPENDENT_AMBULATORY_CARE_PROVIDER_SITE_OTHER): Payer: Medicare Other | Admitting: *Deleted

## 2023-04-08 DIAGNOSIS — Z0000 Encounter for general adult medical examination without abnormal findings: Secondary | ICD-10-CM | POA: Diagnosis not present

## 2023-04-08 NOTE — Patient Instructions (Signed)
Bradley [name] , Thank you for taking time to come for your Medicare Wellness Visit. I appreciate your ongoing commitment to your health goals. Please review the following plan we discussed and let me know if I can assist you in the future.     This is a list of the screening recommended for you and due dates:  Health Maintenance  Topic Date Due   Eye exam for diabetics  02/10/2023   Flu Shot  05/12/2023   Complete foot exam   07/14/2023   Hemoglobin A1C  08/18/2023   DTaP/Tdap/Td vaccine (3 - Tdap) 09/15/2023   Yearly kidney function blood test for diabetes  01/24/2024   Yearly kidney health urinalysis for diabetes  02/15/2024   Medicare Annual Wellness Visit  04/07/2024   Pneumonia Vaccine  Completed   Zoster (Shingles) Vaccine  Completed   HPV Vaccine  Aged Out   COVID-19 Vaccine  Discontinued   Hepatitis C Screening  Discontinued    Next appointment: Follow up in one year for your annual wellness visit.   Preventive Care 12 Years and Older, Male Preventive care refers to lifestyle choices and visits with your health care provider that can promote health and wellness. What does preventive care include? A yearly physical exam. This is also called an annual well check. Dental exams once or twice a year. Routine eye exams. Ask your health care provider how often you should have your eyes checked. Personal lifestyle choices, including: Daily care of your teeth and gums. Regular physical activity. Eating a healthy diet. Avoiding tobacco and drug use. Limiting alcohol use. Practicing safe sex. Taking low doses of aspirin every day. Taking vitamin and mineral supplements as recommended by your health care provider. What happens during an annual well check? The services and screenings done by your health care provider during your annual well check will depend on your age, overall health, lifestyle risk factors, and family history of disease. Counseling  Your health care provider  may ask you questions about your: Alcohol use. Tobacco use. Drug use. Emotional well-being. Home and relationship well-being. Sexual activity. Eating habits. History of falls. Memory and ability to understand (cognition). Work and work Astronomer. Screening  You may have the following tests or measurements: Height, weight, and BMI. Blood pressure. Lipid and cholesterol levels. These may be checked every 5 years, or more frequently if you are over 25 years old. Skin check. Lung cancer screening. You may have this screening every year starting at age 3 if you have a 30-pack-year history of smoking and currently smoke or have quit within the past 15 years. Fecal occult blood test (FOBT) of the stool. You may have this test every year starting at age 59. Flexible sigmoidoscopy or colonoscopy. You may have a sigmoidoscopy every 5 years or a colonoscopy every 10 years starting at age 61. Prostate cancer screening. Recommendations will vary depending on your family history and other risks. Hepatitis C blood test. Hepatitis B blood test. Sexually transmitted disease (STD) testing. Diabetes screening. This is done by checking your blood sugar (glucose) after you have not eaten for a while (fasting). You may have this done every 1-3 years. Abdominal aortic aneurysm (AAA) screening. You may need this if you are a current or former smoker. Osteoporosis. You may be screened starting at age 15 if you are at high risk. Talk with your health care provider about your test results, treatment options, and if necessary, the need for more tests. Vaccines  Your health care  provider may recommend certain vaccines, such as: Influenza vaccine. This is recommended every year. Tetanus, diphtheria, and acellular pertussis (Tdap, Td) vaccine. You may need a Td booster every 10 years. Zoster vaccine. You may need this after age 80. Pneumococcal 13-valent conjugate (PCV13) vaccine. One dose is recommended after  age 61. Pneumococcal polysaccharide (PPSV23) vaccine. One dose is recommended after age 74. Talk to your health care provider about which screenings and vaccines you need and how often you need them. This information is not intended to replace advice given to you by your health care provider. Make sure you discuss any questions you have with your health care provider. Document Released: 10/24/2015 Document Revised: 06/16/2016 Document Reviewed: 07/29/2015 Elsevier Interactive Patient Education  2017 [employer].  Fall Prevention in the Home Falls can cause injuries. They can happen to people of all ages. There are many things you can do to make your home safe and to help prevent falls. What can I do on the outside of my home? Regularly fix the edges of walkways and driveways and fix any cracks. Remove anything that might make you trip as you walk through a door, such as a raised step or threshold. Trim any bushes or trees on the path to your home. Use bright outdoor lighting. Clear any walking paths of anything that might make someone trip, such as rocks or tools. Regularly check to see if handrails are loose or broken. Make sure that both sides of any steps have handrails. Any raised decks and porches should have guardrails on the edges. Have any leaves, snow, or ice cleared regularly. Use sand or salt on walking paths during winter. Clean up any spills in your garage right away. This includes oil or grease spills. What can I do in the bathroom? Use night lights. Install grab bars by the toilet and in the tub and shower. Do not use towel bars as grab bars. Use non-skid mats or decals in the tub or shower. If you need to sit down in the shower, use a plastic, non-slip stool. Keep the floor dry. Clean up any water that spills on the floor as soon as it happens. Remove soap buildup in the tub or shower regularly. Attach bath mats securely with double-sided non-slip rug tape. Do not have  throw rugs and other things on the floor that can make you trip. What can I do in the bedroom? Use night lights. Make sure that you have a light by your bed that is easy to reach. Do not use any sheets or blankets that are too big for your bed. They should not hang down onto the floor. Have a firm chair that has side arms. You can use this for support while you get dressed. Do not have throw rugs and other things on the floor that can make you trip. What can I do in the kitchen? Clean up any spills right away. Avoid walking on wet floors. Keep items that you use a lot in easy-to-reach places. If you need to reach something above you, use a strong step stool that has a grab bar. Keep electrical cords out of the way. Do not use floor polish or wax that makes floors slippery. If you must use wax, use non-skid floor wax. Do not have throw rugs and other things on the floor that can make you trip. What can I do with my stairs? Do not leave any items on the stairs. Make sure that there are handrails on both  sides of the stairs and use them. Fix handrails that are broken or loose. Make sure that handrails are as long as the stairways. Check any carpeting to make sure that it is firmly attached to the stairs. Fix any carpet that is loose or worn. Avoid having throw rugs at the top or bottom of the stairs. If you do have throw rugs, attach them to the floor with carpet tape. Make sure that you have a light switch at the top of the stairs and the bottom of the stairs. If you do not have them, ask someone to add them for you. What else can I do to help prevent falls? Wear shoes that: Do not have high heels. Have rubber bottoms. Are comfortable and fit you well. Are closed at the toe. Do not wear sandals. If you use a stepladder: Make sure that it is fully opened. Do not climb a closed stepladder. Make sure that both sides of the stepladder are locked into place. Ask someone to hold it for you, if  possible. Clearly mark and make sure that you can see: Any grab bars or handrails. First and last steps. Where the edge of each step is. Use tools that help you move around (mobility aids) if they are needed. These include: Canes. Walkers. Scooters. Crutches. Turn on the lights when you go into a dark area. Replace any light bulbs as soon as they burn out. Set up your furniture so you have a clear path. Avoid moving your furniture around. If any of your floors are uneven, fix them. If there are any pets around you, be aware of where they are. Review your medicines with your doctor. Some medicines can make you feel dizzy. This can increase your chance of falling. Ask your doctor what other things that you can do to help prevent falls. This information is not intended to replace advice given to you by your health care provider. Make sure you discuss any questions you have with your health care provider. Document Released: 07/24/2009 Document Revised: 03/04/2016 Document Reviewed: 11/01/2014 Elsevier Interactive Patient Education  2017 [employer].

## 2023-04-08 NOTE — Progress Notes (Signed)
Subjective:   Bradley Searles Sr. is a 82 y.o. male who presents for Medicare Annual/Subsequent preventive examination.  Visit Complete: Virtual  I connected with  Bradley Searles Sr. on 04/08/23 by a audio enabled telemedicine application and verified that I am speaking with the correct person using two identifiers.  Patient Location: Home  Provider Location: Office/Clinic  I discussed the limitations of evaluation and management by telemedicine. The patient expressed understanding and agreed to proceed.   Review of Systems     Cardiac Risk Factors include: male gender;advanced age (>66men, >25 women);diabetes mellitus;dyslipidemia;hypertension     Objective:    Today's Vitals   There is no height or weight on file to calculate BMI.     04/08/2023    9:43 AM 01/24/2023    3:48 PM 11/10/2022    3:10 PM 10/02/2022    5:32 AM 10/02/2022    5:00 AM 08/27/2022    4:55 AM 08/23/2022    1:27 PM  Advanced Directives  Does Patient Have a Medical Advance Directive? Yes No Yes  Yes No No  Type of Estate agent of [place];Living will  Healthcare Power of [place];Living will Healthcare Power of Attorney     Does patient want to make changes to medical advance directive?    No - Patient declined     Copy of Healthcare Power of Attorney in Chart? No - copy requested  No - copy requested      Would patient like information on creating a medical advance directive?      No - Patient declined No - Patient declined    Current Medications (verified) Outpatient Encounter Medications as of 04/08/2023  Medication Sig   acetaminophen (TYLENOL) 500 MG tablet Take 1,000 mg by mouth every 8 (eight) hours as needed for headache or moderate pain.   albuterol (VENTOLIN HFA) 108 (90 Base) MCG/ACT inhaler Inhale 2 puffs into the lungs every 6 (six) hours as needed for wheezing or shortness of breath.   amLODipine (NORVASC) 10 MG tablet TAKE 1 TABLET DAILY   aspirin 81 MG tablet Take  81 mg by mouth every morning.    atorvastatin (LIPITOR) 40 MG tablet Take 1 tablet (40 mg total) by mouth at bedtime.   bisoprolol (ZEBETA) 10 MG tablet Take 1 tablet (10 mg total) by mouth daily.   budesonide (PULMICORT) 0.5 MG/2ML nebulizer solution Take 2 mLs (0.5 mg total) by nebulization 2 (two) times daily.   calcium-vitamin D (OSCAL WITH D) 500-200 MG-UNIT tablet Take 1 tablet by mouth every morning.   cholecalciferol (VITAMIN D3) 25 MCG (1000 UNIT) tablet Take 1,000 Units by mouth every evening.   dapagliflozin propanediol (FARXIGA) 10 MG TABS tablet Take 1 tablet (10 mg total) by mouth daily before breakfast.   diphenhydramine-acetaminophen (TYLENOL PM) 25-500 MG TABS tablet Take 1 tablet by mouth at bedtime as needed (sleep).   furosemide (LASIX) 40 MG tablet TAKE 2 TABLETS EVERY MORNING AND 1 TABLET LATE IN THE AFTERNOON   glucose blood (ONETOUCH VERIO) test strip USE TO CHECK BLOOD SUGAR NO MORE THAN TWICE A DAY   guaiFENesin (MUCINEX) 600 MG 12 hr tablet Take 600 mg by mouth 2 (two) times daily.   ipratropium-albuterol (DUONEB) 0.5-2.5 (3) MG/3ML SOLN Take 3 mLs by nebulization every 6 (six) hours as needed. (Patient taking differently: Take 3 mLs by nebulization in the morning and at bedtime.)   Lancets (ONETOUCH ULTRASOFT) lancets Check blood sugars no more than twice daily  Multiple Vitamins-Minerals (MULTIVITAMINS THER. W/MINERALS) TABS tablet Take 1 tablet by mouth daily.   nitroGLYCERIN (NITROSTAT) 0.4 MG SL tablet Place 1 tablet (0.4 mg total) under the tongue every 5 (five) minutes x 3 doses as needed for chest pain.   OXYGEN Inhale 4 L/min into the lungs continuous.   pantoprazole (PROTONIX) 40 MG tablet TAKE 1 TABLET DAILY BEFORE BREAKFAST   tamsulosin (FLOMAX) 0.4 MG CAPS capsule Take 1 capsule (0.4 mg total) by mouth daily after supper.   zolpidem (AMBIEN) 10 MG tablet Take 10 mg by mouth at bedtime as needed for sleep.   No facility-administered encounter medications on  file as of 04/08/2023.    Allergies (verified) Hydrocodone, Ultram [tramadol], Cialis [tadalafil], and Avelox [moxifloxacin hydrochloride]   History: Past Medical History:  Diagnosis Date   Abnormal CT scan, chest 2012   CT chest, several lymphadenopathies. Sees pulmonary   Anemia    intermittent   Arthritis    "?back" (09/19/2018)   Atrial fibrillation (HCC)    s/p AV node ablation & BiV PPM implantation 09/08/11 (op dictation pending)   BPH (benign prostatic hyperplasia)    Saw Dr [name] 2004, normal renal u/s   Bronchitis 08/26/2017   CAD (coronary artery disease)    CHF (congestive heart failure) (HCC)    Thought primarily to be non-systolic although EF down (EF 08-65% 12/2010, down to 35-40% 09/05/11), cath 2008 with no CAD, nuclear study 07/2011 showing Small area of reversibility in the distal ant/lat wall the left ventricle suspicious for ischemia/septal wall HK but felt to be low risk  (per D/C Summary 07/2011)   Chronic bronchitis (HCC)    "get it ~ q yr"   Chronic diastolic heart failure (HCC)         Chronic respiratory failure with hypoxia and hypercapnia (HCC) 09/14/2010   Followed in Pulmonary clinic/  Healthcare/ [facility]  Started on 02 2lpm at discharge 09/23/11   - PFT's 10/28/2011  FEV1  1.40 (51%)  with ratio 70 and no better p B2 and DLCO 53 corrects to 101    - PFT's 10/18/2014    FEV1 1.71 (59%) with ratio 68 and no sign change p B2 and DLCO 53 corrects to 85  -  HC03   07/28/20  = 33  -  HCO3   03/17/21     = 31     Heart murmur    HLD (hyperlipidemia)    HTN (hypertension)    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   Pleural effusion 2008   S/p decortication   Pneumonia 08/02/2011   Pulmonary HTN (HCC)    per cath 2008   Type II diabetes mellitus (HCC) 1999   Past  Surgical History:  Procedure Laterality Date   BI-VENTRICULAR PACEMAKER INSERTION Left 09/08/2011   Procedure: BI-VENTRICULAR PACEMAKER INSERTION (CRT-P);  Surgeon: Marinus Maw, MD;  Location: [facility] CATH LAB;  Service: Cardiovascular;  Laterality: Left;   BIV PACEMAKER GENERATOR CHANGEOUT N/A 11/10/2022   Procedure: BIV PACEMAKER GENERATOR CHANGEOUT;  Surgeon: Marinus Maw, MD;  Location: [facility] INVASIVE CV LAB;  Service: Cardiovascular;  Laterality: N/A;   CARDIAC CATHETERIZATION     "couple times; never had balloon or stent" (09/19/2018)   CATARACT EXTRACTION W/  INTRAOCULAR LENS  IMPLANT, BILATERAL Bilateral 08/2018   COLONOSCOPY  03/10/11   normal   INSERT / REPLACE / REMOVE PACEMAKER  09/08/11   pacemaker placement   LUNG DECORTICATION     PLEURAL SCARIFICATION     pneumothorax with fibrothorax  ~ 2010   RIGHT/LEFT HEART CATH AND CORONARY ANGIOGRAPHY N/A 10/05/2022   Procedure: RIGHT/LEFT HEART CATH AND CORONARY ANGIOGRAPHY;  Surgeon: Corky Crafts, MD;  Location[facility] INVASIVE CV LAB;  Service: Cardiovascular;  Laterality: N/A;   TONSILLECTOMY     "as a kid"    Family History  Problem Relation Age of Onset   Diabetes Father    Coronary artery disease Father    Polycythemia Mother    Drug abuse Son    Breast cancer Maternal Aunt    Prostate cancer Neg Hx    Colon cancer Neg Hx    Social History   Socioeconomic History   Marital status: Married    Spouse name: [place]   Number of children: 1   Years of education: Not on file   Highest education level: Not on file  Occupational History   Occupation: retired Publishing rights manager: RETIRED  Tobacco Use   Smoking status: Former    Packs/day: 2.50    Years: 25.00    Additional pack years: 0.00    Total pack years: 62.50    Types: Cigarettes, Cigars    Quit date: 10/11/1980    Years since quitting: 42.5   Smokeless tobacco: Never  Vaping Use   Vaping Use: Never used  Substance and Sexual Activity    Alcohol use: Not Currently    Comment: 1 can of beer ever 4-5 months   Drug use: No   Sexual activity: Not Currently  Other Topics Concern   Not on file  Social History Narrative   Moved from [place]   He lives w/ his wife, [name]   1 son, substance abuse, passed away 09/30/16         Social Determinants of Health   Financial Resource Strain: Low Risk  (04/08/2023)   Overall Financial Resource Strain (CARDIA)    Difficulty of Paying Living Expenses: Not hard at all  Food Insecurity: No Food Insecurity (10/14/2022)   Hunger Vital Sign    Worried About Running Out of Food in the Last Year: Never true    Ran Out of Food in the Last Year: Never true  Transportation Needs: No Transportation Needs (10/14/2022)   PRAPARE - Administrator, Civil Service (Medical): No    Lack of Transportation (Non-Medical): No  Physical Activity: Insufficiently Active (04/08/2023)   Exercise Vital Sign    Days of Exercise per Week: 1 day    Minutes of Exercise per Session: 20 min  Stress: No Stress Concern Present (04/08/2023)   [employer] of Occupational Health - Occupational Stress Questionnaire    Feeling of Stress : Not at all  Social Connections: Moderately Integrated (04/08/2023)   Social Connection and Isolation Panel [NHANES]    Frequency of Communication with Friends and Family: Twice a week    Frequency of Social Gatherings with Friends and Family: Twice a week    Attends Religious Services: More than 4 times per year    Active Member of [employer] or Organizations: No    Attends Banker Meetings: Never    Marital Status: Married    Tobacco Counseling Counseling given: Not Answered   Clinical Intake:  Pre-visit preparation completed: Yes  Pain : No/denies pain  Nutritional Risks: None Diabetes: Yes CBG done?: No Did pt. bring in CBG monitor from home?: No  How often do you need to have someone help you when you read instructions, pamphlets, or other  written materials from your doctor or pharmacy?: 1 - Never  Interpreter Needed?: No  Information entered by :: Donne Anon, CMA   Activities of Daily Living    04/08/2023    9:40 AM 10/01/2022    9:43 PM  In your present state of health, do you have any difficulty performing the following activities:  Hearing? 0   Vision? 0   Difficulty concentrating or making decisions? 0   Walking or climbing stairs? 0   Dressing or bathing? 0   Doing errands, shopping? 0 0  Preparing Food and eating ? N   Using the Toilet? N   In the past six months, have you accidently leaked urine? N   Do you have problems with loss of bowel control? N   Managing your Medications? N   Managing your Finances? N   Housekeeping or managing your Housekeeping? N     Patient Care Team: [name], MD as PCP - General (Internal Medicine) Marinus Maw, MD as Consulting Physician (Cardiology) Nyoka Cowden, MD as Consulting Physician (Pulmonary Disease) Melvenia Beam, MD as Consulting Physician (Otolaryngology) Ethan, Dede Query, OD (Optometry) [name], MD as Consulting Physician (Cardiology) Henrene Pastor, RPH-CPP (Pharmacist)  Indicate any recent Medical Services you may have received from other than [facility] providers in the past year (date may be approximate).     Assessment:   This is a routine wellness examination for Bradley [name].  Hearing/Vision screen No results found.  Dietary issues and exercise activities discussed:     Goals Addressed   None    Depression Screen    04/08/2023    9:45 AM 02/15/2023    2:22 PM 02/02/2023    2:49 PM 11/15/2022    9:40 AM 09/06/2022    8:13 AM 07/13/2022   10:53 AM 04/05/2022   10:46 AM  PHQ 2/9 Scores  PHQ - 2 Score 0 0 0 0 0 0 0  PHQ- 9 Score  0 0        Fall Risk    04/08/2023    9:43 AM 02/15/2023    2:22 PM 02/02/2023    2:49 PM 11/15/2022    9:40 AM 09/06/2022    8:13 AM  Fall Risk   Falls in the past year? 0 0 0 0 0  Number falls in past  yr: 0 0 0 0 0  Injury with Fall? 0 0 0 0 0  Risk for fall due to : No Fall Risks      Follow up Falls evaluation completed Falls evaluation completed Falls evaluation completed Falls evaluation completed Falls evaluation completed    MEDICARE RISK AT HOME:  Medicare Risk at Home - 04/08/23 0942     Any stairs in or around the home? Yes    If so, are there any without handrails? No    Home free of loose throw rugs in walkways, pet beds, electrical cords, etc? Yes    Adequate lighting in your home to reduce risk of falls? Yes    Life alert? No    Use of a cane, walker or w/c? No    Grab bars in the bathroom? Yes    Shower chair or bench in shower? Yes  Elevated toilet seat or a handicapped toilet? No             TIMED UP AND GO:  Was the test performed?  No    Cognitive Function:    09/07/2017    1:32 PM  MMSE - Mini Mental State Exam  Not completed: Refused        04/08/2023    9:47 AM 04/05/2022   10:52 AM  6CIT Screen  What Year? 0 points 0 points  What month? 0 points 0 points  What time? 0 points 0 points  Count back from 20 0 points 0 points  Months in reverse 0 points 0 points  Repeat phrase 6 points 6 points  Total Score 6 points 6 points    Immunizations Immunization History  Administered Date(s) Administered   Fluad Quad(high Dose 65+) 06/26/2019, 08/11/2020, 07/13/2022   Influenza Split 10/01/2011   Influenza Whole 08/14/2007, 07/11/2009, 09/14/2010   Influenza, High Dose Seasonal PF 09/14/2013, 07/15/2015, 07/09/2016, 07/05/2017, 08/03/2018   Influenza,inj,Quad PF,6+ Mos 07/05/2014   Influenza-Unspecified 08/11/2012, 08/11/2021   Janssen (J&J) SARS-COV-2 Vaccination 01/12/2020, 09/19/2020   PFIZER Comirnaty(Gray Top)Covid-19 Tri-Sucrose Vaccine 03/18/2021   PNEUMOCOCCAL CONJUGATE-20 11/15/2022   Pfizer Covid-19 Vaccine Bivalent Booster 47yrs & up 08/11/2021   Pneumococcal Conjugate-13 03/12/2015   Pneumococcal Polysaccharide-23 07/11/2004,  03/30/2009, 11/01/2017, 05/11/2019   Td 03/12/2003   Tetanus 09/14/2013   Zoster Recombinat (Shingrix) 03/11/2022, 09/10/2022   Zoster, Live 04/20/2011    TDAP status: Up to date  Flu Vaccine status: Up to date  Pneumococcal vaccine status: Up to date  Covid-19 vaccine status: Information provided on how to obtain vaccines.   Qualifies for Shingles Vaccine? Yes   Zostavax completed Yes   Shingrix Completed?: Yes  Screening Tests Health Maintenance  Topic Date Due   OPHTHALMOLOGY EXAM  02/10/2023   INFLUENZA VACCINE  05/12/2023   FOOT EXAM  07/14/2023   HEMOGLOBIN A1C  08/18/2023   DTaP/Tdap/Td (3 - Tdap) 09/15/2023   Diabetic kidney evaluation - eGFR measurement  01/24/2024   Diabetic kidney evaluation - Urine ACR  02/15/2024   Medicare Annual Wellness (AWV)  04/07/2024   Pneumonia Vaccine 32+ Years old  Completed   Zoster Vaccines- Shingrix  Completed   HPV VACCINES  Aged Out   COVID-19 Vaccine  Discontinued   Hepatitis C Screening  Discontinued    Health Maintenance  Health Maintenance Due  Topic Date Due   OPHTHALMOLOGY EXAM  02/10/2023    Colorectal cancer screening: No longer required.   Lung Cancer Screening: (Low Dose CT Chest recommended if Age 41-80 years, 20 pack-year currently smoking OR have quit w/in 15years.) does not qualify.   Additional Screening:  Hepatitis C Screening: does qualify; Completed 12/09/20  Vision Screening: Recommended annual ophthalmology exams for early detection of glaucoma and other disorders of the eye. Is the patient up to date with their annual eye exam?  Yes  Who is the provider or what is the name of the office in which the patient attends annual eye exams? Doesn't remember the name at this time If pt is not established with a provider, would they like to be referred to a provider to establish care? No .   Dental Screening: Recommended annual dental exams for proper oral hygiene  Diabetic Foot Exam: Diabetic Foot Exam:  Completed 07/13/22  Community Resource Referral / Chronic Care Management: CRR required this visit?  No   CCM required this visit?  No     Plan:  I have personally reviewed and noted the following in the patient's chart:   Medical and social history Use of alcohol, tobacco or illicit drugs  Current medications and supplements including opioid prescriptions. Patient is not currently taking opioid prescriptions. Functional ability and status Nutritional status Physical activity Advanced directives List of other physicians Hospitalizations, surgeries, and ER visits in previous 12 months Vitals Screenings to include cognitive, depression, and falls Referrals and appointments  In addition, I have reviewed and discussed with patient certain preventive protocols, quality metrics, and best practice recommendations. A written personalized care plan for preventive services as well as general preventive health recommendations were provided to patient.     Donne Anon, CMA   04/08/2023   After Visit Summary: (MyChart) Due to this being a telephonic visit, the after visit summary with patients personalized plan was offered to patient via MyChart   Nurse Notes: None

## 2023-04-19 ENCOUNTER — Telehealth: Payer: Self-pay | Admitting: Internal Medicine

## 2023-04-19 NOTE — Telephone Encounter (Signed)
We do not do these screenings- most of the time they are spam.

## 2023-04-19 NOTE — Telephone Encounter (Signed)
Dawn from [facility] stated they faxed over a request for labs last week and have no heard anything back. She stated once this has been reviewed to fax back to [phone].

## 2023-05-09 ENCOUNTER — Other Ambulatory Visit: Payer: Self-pay | Admitting: Internal Medicine

## 2023-05-14 ENCOUNTER — Emergency Department (HOSPITAL_BASED_OUTPATIENT_CLINIC_OR_DEPARTMENT_OTHER)
Admission: EM | Admit: 2023-05-14 | Discharge: 2023-05-14 | Disposition: A | Discharge status: Home / Self Care | Payer: Medicare Other | Attending: Emergency Medicine | Admitting: Emergency Medicine

## 2023-05-14 ENCOUNTER — Emergency Department (HOSPITAL_BASED_OUTPATIENT_CLINIC_OR_DEPARTMENT_OTHER): Payer: Medicare Other

## 2023-05-14 ENCOUNTER — Other Ambulatory Visit: Payer: Self-pay

## 2023-05-14 ENCOUNTER — Encounter (HOSPITAL_BASED_OUTPATIENT_CLINIC_OR_DEPARTMENT_OTHER): Payer: Self-pay | Admitting: Emergency Medicine

## 2023-05-14 DIAGNOSIS — W010XXA Fall on same level from slipping, tripping and stumbling without subsequent striking against object, initial encounter: Secondary | ICD-10-CM | POA: Insufficient documentation

## 2023-05-14 DIAGNOSIS — J449 Chronic obstructive pulmonary disease, unspecified: Secondary | ICD-10-CM | POA: Insufficient documentation

## 2023-05-14 DIAGNOSIS — Y92129 Unspecified place in nursing home as the place of occurrence of the external cause: Secondary | ICD-10-CM | POA: Insufficient documentation

## 2023-05-14 DIAGNOSIS — S52592A Other fractures of lower end of left radius, initial encounter for closed fracture: Secondary | ICD-10-CM | POA: Insufficient documentation

## 2023-05-14 DIAGNOSIS — S6992XA Unspecified injury of left wrist, hand and finger(s), initial encounter: Secondary | ICD-10-CM | POA: Diagnosis present

## 2023-05-14 DIAGNOSIS — Z7982 Long term (current) use of aspirin: Secondary | ICD-10-CM | POA: Diagnosis not present

## 2023-05-14 MED ORDER — ACETAMINOPHEN 325 MG PO TABS
650.0000 mg | ORAL_TABLET | Freq: Once | ORAL | Status: AC
  Administered 2023-05-14: 650 mg via ORAL
  Filled 2023-05-14: qty 2

## 2023-05-14 NOTE — ED Provider Notes (Signed)
[place] EMERGENCY DEPARTMENT AT [facility] Provider Note   CSN: [id number] Arrival date & time: 05/14/23  0418     History  Chief Complaint  Patient presents with   Fall   Wrist Pain    Bradley Seltzer Werts Sr. is a 82 y.o. male.  The history is provided by the patient.  Patient with history of COPD and chronic respiratory failure presents with left wrist pain.  Patient reports that he slipped and fell yesterday landing on his left wrist while he was visiting his wife at the rehab center.  Denies any head injury or neck injury.  Pain is only in the left wrist.  He is ambulatory.  He does not take any anticoagulation     Home Medications Prior to Admission medications   Medication Sig Start Date End Date Taking? Authorizing Provider  acetaminophen (TYLENOL) 500 MG tablet Take 1,000 mg by mouth every 8 (eight) hours as needed for headache or moderate pain.    [provider]  albuterol (VENTOLIN HFA) 108 (90 Base) MCG/ACT inhaler Inhale 2 puffs into the lungs every 6 (six) hours as needed for wheezing or shortness of breath. 03/11/21   Ghimire, Werner Lean, MD  amLODipine (NORVASC) 10 MG tablet TAKE 1 TABLET DAILY 11/30/22   [name], MD  aspirin 81 MG tablet Take 81 mg by mouth every morning.     [provider]  atorvastatin (LIPITOR) 40 MG tablet Take 1 tablet (40 mg total) by mouth at bedtime. 10/21/22   Alver Sorrow, NP  bisoprolol (ZEBETA) 10 MG tablet TAKE 1 TABLET DAILY 05/09/23   [name], MD  budesonide (PULMICORT) 0.5 MG/2ML nebulizer solution Take 2 mLs (0.5 mg total) by nebulization 2 (two) times daily. 08/02/22   [name], MD  calcium-vitamin D (OSCAL WITH D) 500-200 MG-UNIT tablet Take 1 tablet by mouth every morning.    [provider]  cholecalciferol (VITAMIN D3) 25 MCG (1000 UNIT) tablet Take 1,000 Units by mouth every evening.    [provider]  dapagliflozin propanediol (FARXIGA) 10 MG TABS tablet Take 1 tablet  (10 mg total) by mouth daily before breakfast. 12/29/22   [name], MD  diphenhydramine-acetaminophen (TYLENOL PM) 25-500 MG TABS tablet Take 1 tablet by mouth at bedtime as needed (sleep).    [provider]  furosemide (LASIX) 40 MG tablet TAKE 2 TABLETS EVERY MORNING AND 1 TABLET LATE IN THE AFTERNOON 02/21/23   [name], MD  glucose blood (ONETOUCH VERIO) test strip USE TO CHECK BLOOD SUGAR NO MORE THAN TWICE A DAY 03/28/23   [name], MD  guaiFENesin (MUCINEX) 600 MG 12 hr tablet Take 600 mg by mouth 2 (two) times daily.    [provider]  ipratropium-albuterol (DUONEB) 0.5-2.5 (3) MG/3ML SOLN Take 3 mLs by nebulization every 6 (six) hours as needed. Patient taking differently: Take 3 mLs by nebulization in the morning and at bedtime. 08/02/22   [name], MD  Lancets [facility] ULTRASOFT) lancets Check blood sugars no more than twice daily 03/08/17   [name], MD  Multiple Vitamins-Minerals (MULTIVITAMINS THER. W/MINERALS) TABS tablet Take 1 tablet by mouth daily. 09/14/13   [name], MD  nitroGLYCERIN (NITROSTAT) 0.4 MG SL tablet Place 1 tablet (0.4 mg total) under the tongue every 5 (five) minutes x 3 doses as needed for chest pain. 09/07/21   [name], MD  OXYGEN Inhale 4 L/min into the  lungs continuous.    [provider]  pantoprazole (PROTONIX) 40 MG tablet TAKE 1 TABLET DAILY BEFORE BREAKFAST 11/30/22   [name], MD  tamsulosin (FLOMAX) 0.4 MG CAPS capsule Take 1 capsule (0.4 mg total) by mouth daily after supper. 11/23/22   [name], MD  zolpidem (AMBIEN) 10 MG tablet Take 10 mg by mouth at bedtime as needed for sleep.    [provider]      Allergies    Hydrocodone, Ultram [tramadol], Cialis [tadalafil], and Avelox [moxifloxacin hydrochloride]    Review of Systems   Review of Systems  Musculoskeletal:  Positive for arthralgias and joint swelling.    Physical Exam Updated Vital Signs BP 139/82 (BP Location: Right  Arm)   Pulse 82   Temp 97.8 F (36.6 C) (Oral)   Resp (!) 24   Ht 1.727 m (5\' 8" )   Wt 79.4 kg   SpO2 95%   BMI 26.61 kg/m  Physical Exam CONSTITUTIONAL: Chronically ill-appearing HEAD: Normocephalic/atraumatic NEURO: Pt is awake/alert/appropriate, moves all extremitiesx4.  No facial droop.   EXTREMITIES: pulses normal/equal, full ROM Tenderness and swelling is noted to the left wrist.  Scattered abrasions are noted.  No lacerations.  Distal pulses intact.  He is able move his fingers on his left hand without difficulty.  There is no hand tenderness.  No left elbow tenderness, full range of motion left elbow SKIN: warm, color normal PSYCH: no abnormalities of mood noted, alert and oriented to situation  ED Results / Procedures / Treatments   Labs (all labs ordered are listed, but only abnormal results are displayed) Labs Reviewed - No data to display  EKG None  Radiology DG Wrist Complete Left  Result Date: 05/14/2023 CLINICAL DATA:  Wrist pain due to fall yesterday. Initial encounter. EXAM: LEFT WRIST - COMPLETE 3 VIEW COMPARISON:  None Available. FINDINGS: Transverse fracture through the distal radius with mild dorsal impaction. The fracture is extra-articular. Remote and healed third metacarpal shaft fracture. Subjective osteopenia. IMPRESSION: Distal radius fracture with mild dorsal impaction. Electronically Signed   By: Tiburcio Pea M.D.   On: 05/14/2023 05:06    Procedures .Ortho Injury Treatment  Date/Time: 05/14/2023 5:19 AM  Performed by: Zadie Rhine, MD Authorized by: Zadie Rhine, MD   Consent:    Consent obtained:  Verbal   Consent given by:  PatientInjury location: wrist Location details: left wrist Injury type: fracture Fracture type: distal radius Pre-procedure neurovascular assessment: neurovascularly intact Pre-procedure distal perfusion: normal Pre-procedure neurological function: normal Pre-procedure range of motion: reduced Manipulation  performed: no Immobilization: splint Splint type: sugar tong Splint Applied by: ED Nurse Supplies used: Ortho-Glass Post-procedure neurovascular assessment: post-procedure neurovascularly intact Post-procedure distal perfusion: normal Post-procedure neurological function: normal Post-procedure range of motion: unchanged       Medications Ordered in ED Medications  acetaminophen (TYLENOL) tablet 650 mg (650 mg Oral Given 05/14/23 0454)    ED Course/ Medical Decision Making/ A&P Clinical Course as of 05/14/23 0541  Sat May 14, 2023  0532 Patient chronically ill at baseline presents after accidental fall yesterday injuring his left wrist.  Patient has obvious left distal radius fracture.  This has been splinted without difficulty.  No other signs of acute traumatic injury.  Of note, patient was initially hypoxic, but he is chronically on oxygen which he did not bring with him [DW]  0533 He will need to have close follow-up with orthopedics next week [DW]    Clinical Course User Index [DW]  Zadie Rhine, MD                                 Medical Decision Making Amount and/or Complexity of Data Reviewed Radiology: ordered.  Risk OTC drugs.           Final Clinical Impression(s) / ED Diagnoses Final diagnoses:  Other closed fracture of distal end of left radius, initial encounter    Rx / DC Orders ED Discharge Orders     None         Zadie Rhine, MD 05/14/23 [phone]

## 2023-05-14 NOTE — ED Triage Notes (Signed)
Pt fell yesterday while visiting his wife at a nursing home. Injury to left wrist, denies loc or head injury. States no blood thinners.

## 2023-05-14 NOTE — ED Notes (Addendum)
Rn attempted to Engineer, drilling, granddaughter to come get pt. No answer. EDP aware- pt is leaving.

## 2023-05-23 ENCOUNTER — Other Ambulatory Visit: Payer: Self-pay | Admitting: Internal Medicine

## 2023-05-25 ENCOUNTER — Inpatient Hospital Stay (HOSPITAL_COMMUNITY)
Admission: EM | Admit: 2023-05-25 | Discharge: 2023-05-28 | DRG: 190 | Disposition: A | Discharge status: Home / Self Care | Payer: Medicare Other | Attending: Internal Medicine | Admitting: Internal Medicine

## 2023-05-25 DIAGNOSIS — Z950 Presence of cardiac pacemaker: Secondary | ICD-10-CM

## 2023-05-25 DIAGNOSIS — Z7982 Long term (current) use of aspirin: Secondary | ICD-10-CM

## 2023-05-25 DIAGNOSIS — Z7984 Long term (current) use of oral hypoglycemic drugs: Secondary | ICD-10-CM

## 2023-05-25 DIAGNOSIS — I110 Hypertensive heart disease with heart failure: Secondary | ICD-10-CM | POA: Diagnosis present

## 2023-05-25 DIAGNOSIS — J9621 Acute and chronic respiratory failure with hypoxia: Secondary | ICD-10-CM | POA: Diagnosis present

## 2023-05-25 DIAGNOSIS — N400 Enlarged prostate without lower urinary tract symptoms: Secondary | ICD-10-CM | POA: Diagnosis present

## 2023-05-25 DIAGNOSIS — Z79899 Other long term (current) drug therapy: Secondary | ICD-10-CM

## 2023-05-25 DIAGNOSIS — J441 Chronic obstructive pulmonary disease with (acute) exacerbation: Principal | ICD-10-CM | POA: Diagnosis present

## 2023-05-25 DIAGNOSIS — Z803 Family history of malignant neoplasm of breast: Secondary | ICD-10-CM

## 2023-05-25 DIAGNOSIS — Z7951 Long term (current) use of inhaled steroids: Secondary | ICD-10-CM

## 2023-05-25 DIAGNOSIS — I4821 Permanent atrial fibrillation: Secondary | ICD-10-CM | POA: Diagnosis present

## 2023-05-25 DIAGNOSIS — I2510 Atherosclerotic heart disease of native coronary artery without angina pectoris: Secondary | ICD-10-CM | POA: Diagnosis present

## 2023-05-25 DIAGNOSIS — I5032 Chronic diastolic (congestive) heart failure: Secondary | ICD-10-CM | POA: Diagnosis present

## 2023-05-25 DIAGNOSIS — I712 Thoracic aortic aneurysm, without rupture, unspecified: Principal | ICD-10-CM

## 2023-05-25 DIAGNOSIS — Z1152 Encounter for screening for COVID-19: Secondary | ICD-10-CM

## 2023-05-25 DIAGNOSIS — E785 Hyperlipidemia, unspecified: Secondary | ICD-10-CM | POA: Diagnosis present

## 2023-05-25 DIAGNOSIS — Z888 Allergy status to other drugs, medicaments and biological substances status: Secondary | ICD-10-CM

## 2023-05-25 DIAGNOSIS — J129 Viral pneumonia, unspecified: Secondary | ICD-10-CM | POA: Diagnosis present

## 2023-05-25 DIAGNOSIS — S62102D Fracture of unspecified carpal bone, left wrist, subsequent encounter for fracture with routine healing: Secondary | ICD-10-CM

## 2023-05-25 DIAGNOSIS — Z9981 Dependence on supplemental oxygen: Secondary | ICD-10-CM

## 2023-05-25 DIAGNOSIS — Z8249 Family history of ischemic heart disease and other diseases of the circulatory system: Secondary | ICD-10-CM

## 2023-05-25 DIAGNOSIS — K219 Gastro-esophageal reflux disease without esophagitis: Secondary | ICD-10-CM | POA: Diagnosis present

## 2023-05-25 DIAGNOSIS — Z961 Presence of intraocular lens: Secondary | ICD-10-CM | POA: Diagnosis present

## 2023-05-25 DIAGNOSIS — W19XXXD Unspecified fall, subsequent encounter: Secondary | ICD-10-CM | POA: Diagnosis present

## 2023-05-25 DIAGNOSIS — Z833 Family history of diabetes mellitus: Secondary | ICD-10-CM

## 2023-05-25 DIAGNOSIS — R509 Fever, unspecified: Secondary | ICD-10-CM

## 2023-05-25 DIAGNOSIS — Z87891 Personal history of nicotine dependence: Secondary | ICD-10-CM

## 2023-05-25 DIAGNOSIS — I272 Pulmonary hypertension, unspecified: Secondary | ICD-10-CM | POA: Diagnosis present

## 2023-05-25 DIAGNOSIS — Z885 Allergy status to narcotic agent status: Secondary | ICD-10-CM

## 2023-05-25 DIAGNOSIS — E119 Type 2 diabetes mellitus without complications: Secondary | ICD-10-CM | POA: Diagnosis present

## 2023-05-25 NOTE — ED Triage Notes (Signed)
Pt BIB EMS, ShOB since 8pm. Hx of COPD, no sick contacts. 86% on 2LNC, 99% on 15LNRB. No hx of CHF, but has been told he has had "fluid on his lungs".

## 2023-05-26 ENCOUNTER — Emergency Department (HOSPITAL_COMMUNITY): Payer: Medicare Other

## 2023-05-26 ENCOUNTER — Encounter (HOSPITAL_COMMUNITY): Payer: Self-pay | Admitting: Internal Medicine

## 2023-05-26 ENCOUNTER — Other Ambulatory Visit: Payer: Self-pay

## 2023-05-26 DIAGNOSIS — J441 Chronic obstructive pulmonary disease with (acute) exacerbation: Secondary | ICD-10-CM | POA: Diagnosis not present

## 2023-05-26 DIAGNOSIS — R509 Fever, unspecified: Secondary | ICD-10-CM

## 2023-05-26 DIAGNOSIS — J129 Viral pneumonia, unspecified: Secondary | ICD-10-CM | POA: Diagnosis present

## 2023-05-26 LAB — URINALYSIS, ROUTINE W REFLEX MICROSCOPIC
Bacteria, UA: NONE SEEN
Bilirubin Urine: NEGATIVE
Glucose, UA: >=500 mg/dL — AB
Hgb urine dipstick: NEGATIVE
Ketones, ur: NEGATIVE mg/dL
Leukocytes,Ua: NEGATIVE
Nitrite: NEGATIVE
Protein, ur: NEGATIVE mg/dL
Specific Gravity, Urine: 1.016 (ref 1.005–1.030)
pH: 5 (ref 5.0–8.0)

## 2023-05-26 LAB — TROPONIN I (HIGH SENSITIVITY)
Troponin I (High Sensitivity): 14 ng/L (ref <18)
Troponin I (High Sensitivity): 13 ng/L (ref <18)

## 2023-05-26 LAB — BASIC METABOLIC PANEL
Anion gap: 11 (ref 5–15)
BUN: 13 mg/dL (ref 8–23)
CO2: 28 mmol/L (ref 22–32)
Calcium: 8.8 mg/dL — ABNORMAL LOW (ref 8.9–10.3)
Chloride: 100 mmol/L (ref 98–111)
Creatinine, Ser: 0.9 mg/dL (ref 0.61–1.24)
GFR, Estimated: >60 mL/min (ref >60)
Glucose, Bld: 142 mg/dL — ABNORMAL HIGH (ref 70–99)
Potassium: 3.9 mmol/L (ref 3.5–5.1)
Sodium: 139 mmol/L (ref 135–145)

## 2023-05-26 LAB — RESPIRATORY PANEL BY PCR

## 2023-05-26 LAB — CBC WITH DIFFERENTIAL/PLATELET
Abs Immature Granulocytes: 0.08 10*3/uL — ABNORMAL HIGH (ref 0.00–0.07)
Basophils Absolute: 0 10*3/uL (ref 0.0–0.1)
Basophils Relative: 0 %
Eosinophils Absolute: 0 10*3/uL (ref 0.0–0.5)
Eosinophils Relative: 0 %
HCT: 41 % (ref 39.0–52.0)
Hemoglobin: 13.6 g/dL (ref 13.0–17.0)
Immature Granulocytes: 1 %
Lymphocytes Relative: 5 %
Lymphs Abs: 0.8 10*3/uL (ref 0.7–4.0)
MCH: 30.8 pg (ref 26.0–34.0)
MCHC: 33.2 g/dL (ref 30.0–36.0)
MCV: 92.8 fL (ref 80.0–100.0)
Monocytes Absolute: 0.8 10*3/uL (ref 0.1–1.0)
Monocytes Relative: 6 %
Neutro Abs: 13.2 10*3/uL — ABNORMAL HIGH (ref 1.7–7.7)
Neutrophils Relative %: 88 %
Platelets: 186 10*3/uL (ref 150–400)
RBC: 4.42 MIL/uL (ref 4.22–5.81)
RDW: 14.5 % (ref 11.5–15.5)
WBC: 14.9 10*3/uL — ABNORMAL HIGH (ref 4.0–10.5)
nRBC: 0 % (ref 0.0–0.2)

## 2023-05-26 LAB — CBC
HCT: 41 % (ref 39.0–52.0)
Hemoglobin: 13.6 g/dL (ref 13.0–17.0)
MCH: 31.1 pg (ref 26.0–34.0)
MCHC: 33.2 g/dL (ref 30.0–36.0)
MCV: 93.8 fL (ref 80.0–100.0)
Platelets: 180 10*3/uL (ref 150–400)
RBC: 4.37 MIL/uL (ref 4.22–5.81)
RDW: 14.8 % (ref 11.5–15.5)
WBC: 16.3 10*3/uL — ABNORMAL HIGH (ref 4.0–10.5)
nRBC: 0 % (ref 0.0–0.2)

## 2023-05-26 LAB — RESP PANEL BY RT-PCR (RSV, FLU A&B, COVID)  RVPGX2
Influenza A by PCR: NEGATIVE
Influenza B by PCR: NEGATIVE
Resp Syncytial Virus by PCR: NEGATIVE
SARS Coronavirus 2 by RT PCR: NEGATIVE

## 2023-05-26 LAB — GLUCOSE, CAPILLARY
Glucose-Capillary: 117 mg/dL — ABNORMAL HIGH (ref 70–99)
Glucose-Capillary: 149 mg/dL — ABNORMAL HIGH (ref 70–99)
Glucose-Capillary: 161 mg/dL — ABNORMAL HIGH (ref 70–99)
Glucose-Capillary: 195 mg/dL — ABNORMAL HIGH (ref 70–99)
Glucose-Capillary: 230 mg/dL — ABNORMAL HIGH (ref 70–99)

## 2023-05-26 LAB — HEMOGLOBIN A1C
Hgb A1c MFr Bld: 6.3 % — ABNORMAL HIGH (ref 4.8–5.6)
Mean Plasma Glucose: 134.11 mg/dL

## 2023-05-26 LAB — CREATININE, SERUM
Creatinine, Ser: 1.03 mg/dL (ref 0.61–1.24)
GFR, Estimated: >60 mL/min (ref >60)

## 2023-05-26 LAB — BRAIN NATRIURETIC PEPTIDE: B Natriuretic Peptide: 437.6 pg/mL — ABNORMAL HIGH (ref 0.0–100.0)

## 2023-05-26 MED ORDER — METHYLPREDNISOLONE SODIUM SUCC 40 MG IJ SOLR
40.0000 mg | Freq: Every day | INTRAMUSCULAR | Status: DC
  Administered 2023-05-26: 40 mg via INTRAVENOUS
  Filled 2023-05-26: qty 1

## 2023-05-26 MED ORDER — ACETAMINOPHEN 650 MG RE SUPP: 650.0000 mg | Freq: Four times a day (QID) | RECTAL | Status: DC | PRN

## 2023-05-26 MED ORDER — ALBUTEROL SULFATE (2.5 MG/3ML) 0.083% IN NEBU: 2.5000 mg | INHALATION_SOLUTION | RESPIRATORY_TRACT | Status: DC | PRN

## 2023-05-26 MED ORDER — PANTOPRAZOLE SODIUM 40 MG PO TBEC
40.0000 mg | DELAYED_RELEASE_TABLET | Freq: Every day | ORAL | Status: DC
  Administered 2023-05-26 – 2023-05-28 (×3): 40 mg via ORAL
  Filled 2023-05-26 (×3): qty 1

## 2023-05-26 MED ORDER — SODIUM CHLORIDE 0.9% FLUSH
3.0000 mL | Freq: Two times a day (BID) | INTRAVENOUS | Status: DC
  Administered 2023-05-26 – 2023-05-27 (×4): 3 mL via INTRAVENOUS

## 2023-05-26 MED ORDER — ZOLPIDEM TARTRATE 5 MG PO TABS: 10.0000 mg | ORAL_TABLET | Freq: Every evening | ORAL | Status: DC | PRN

## 2023-05-26 MED ORDER — DAPAGLIFLOZIN PROPANEDIOL 10 MG PO TABS
10.0000 mg | ORAL_TABLET | Freq: Every day | ORAL | Status: DC
  Administered 2023-05-26 – 2023-05-28 (×3): 10 mg via ORAL
  Filled 2023-05-26 (×3): qty 1

## 2023-05-26 MED ORDER — ONDANSETRON HCL 4 MG PO TABS: 4.0000 mg | ORAL_TABLET | Freq: Four times a day (QID) | ORAL | Status: DC | PRN

## 2023-05-26 MED ORDER — ACETAMINOPHEN 500 MG PO TABS
1000.0000 mg | ORAL_TABLET | Freq: Once | ORAL | Status: AC
  Administered 2023-05-26: 1000 mg via ORAL
  Filled 2023-05-26: qty 2

## 2023-05-26 MED ORDER — BUDESONIDE 0.5 MG/2ML IN SUSP
0.5000 mg | Freq: Two times a day (BID) | RESPIRATORY_TRACT | Status: DC
  Administered 2023-05-26 – 2023-05-28 (×5): 0.5 mg via RESPIRATORY_TRACT
  Filled 2023-05-26 (×5): qty 2

## 2023-05-26 MED ORDER — IPRATROPIUM-ALBUTEROL 0.5-2.5 (3) MG/3ML IN SOLN
3.0000 mL | Freq: Four times a day (QID) | RESPIRATORY_TRACT | Status: DC
  Administered 2023-05-26 (×2): 3 mL via RESPIRATORY_TRACT
  Filled 2023-05-26 (×2): qty 3

## 2023-05-26 MED ORDER — ONDANSETRON HCL 4 MG/2ML IJ SOLN: 4.0000 mg | Freq: Four times a day (QID) | INTRAMUSCULAR | Status: DC | PRN

## 2023-05-26 MED ORDER — ACETAMINOPHEN 325 MG PO TABS
650.0000 mg | ORAL_TABLET | Freq: Four times a day (QID) | ORAL | Status: DC | PRN
  Administered 2023-05-27 (×2): 650 mg via ORAL
  Filled 2023-05-26 (×2): qty 2

## 2023-05-26 MED ORDER — IPRATROPIUM-ALBUTEROL 0.5-2.5 (3) MG/3ML IN SOLN
3.0000 mL | Freq: Once | RESPIRATORY_TRACT | Status: AC
  Administered 2023-05-26: 3 mL via RESPIRATORY_TRACT
  Filled 2023-05-26: qty 3

## 2023-05-26 MED ORDER — ATORVASTATIN CALCIUM 40 MG PO TABS
40.0000 mg | ORAL_TABLET | Freq: Every day | ORAL | Status: DC
  Administered 2023-05-26 – 2023-05-27 (×2): 40 mg via ORAL
  Filled 2023-05-26 (×2): qty 1

## 2023-05-26 MED ORDER — IPRATROPIUM-ALBUTEROL 0.5-2.5 (3) MG/3ML IN SOLN
3.0000 mL | Freq: Three times a day (TID) | RESPIRATORY_TRACT | Status: DC
  Administered 2023-05-27 – 2023-05-28 (×4): 3 mL via RESPIRATORY_TRACT
  Filled 2023-05-26 (×4): qty 3

## 2023-05-26 MED ORDER — INSULIN ASPART 100 UNIT/ML IJ SOLN: 0.0000 [IU] | Freq: Every day | INTRAMUSCULAR | Status: DC

## 2023-05-26 MED ORDER — BISOPROLOL FUMARATE 10 MG PO TABS
10.0000 mg | ORAL_TABLET | Freq: Every day | ORAL | Status: DC
  Administered 2023-05-26 – 2023-05-28 (×3): 10 mg via ORAL
  Filled 2023-05-26 (×4): qty 1

## 2023-05-26 MED ORDER — ENOXAPARIN SODIUM 40 MG/0.4ML IJ SOSY
40.0000 mg | PREFILLED_SYRINGE | INTRAMUSCULAR | Status: DC
  Administered 2023-05-26 – 2023-05-28 (×3): 40 mg via SUBCUTANEOUS
  Filled 2023-05-26 (×3): qty 0.4

## 2023-05-26 MED ORDER — TAMSULOSIN HCL 0.4 MG PO CAPS
0.4000 mg | ORAL_CAPSULE | Freq: Every day | ORAL | Status: DC
  Administered 2023-05-26 – 2023-05-27 (×2): 0.4 mg via ORAL
  Filled 2023-05-26 (×2): qty 1

## 2023-05-26 MED ORDER — INSULIN ASPART 100 UNIT/ML IJ SOLN
0.0000 [IU] | Freq: Three times a day (TID) | INTRAMUSCULAR | Status: DC
  Administered 2023-05-26 – 2023-05-27 (×3): 2 [IU] via SUBCUTANEOUS
  Administered 2023-05-27: 3 [IU] via SUBCUTANEOUS
  Administered 2023-05-28: 2 [IU] via SUBCUTANEOUS

## 2023-05-26 MED ORDER — AMLODIPINE BESYLATE 10 MG PO TABS
10.0000 mg | ORAL_TABLET | Freq: Every day | ORAL | Status: DC
  Administered 2023-05-26 – 2023-05-28 (×3): 10 mg via ORAL
  Filled 2023-05-26 (×3): qty 1

## 2023-05-26 MED ORDER — IOHEXOL 350 MG/ML SOLN
75.0000 mL | Freq: Once | INTRAVENOUS | Status: AC | PRN
  Administered 2023-05-26: 75 mL via INTRAVENOUS

## 2023-05-26 MED ORDER — SODIUM CHLORIDE 0.9 % IV SOLN
1.0000 g | Freq: Once | INTRAVENOUS | Status: AC
  Administered 2023-05-26: 1 g via INTRAVENOUS
  Filled 2023-05-26: qty 10

## 2023-05-26 MED ORDER — ALBUTEROL SULFATE (2.5 MG/3ML) 0.083% IN NEBU
5.0000 mg | INHALATION_SOLUTION | Freq: Once | RESPIRATORY_TRACT | Status: AC
  Administered 2023-05-26: 5 mg via RESPIRATORY_TRACT
  Filled 2023-05-26: qty 6

## 2023-05-26 NOTE — ED Notes (Signed)
ED TO INPATIENT HANDOFF REPORT  ED Nurse Name and Phone #:  Marisue Ivan [phone]  F Name/Age/Gender Bradley Searles Sr. 82 y.o. male Room/Bed: 023C/023C  Code Status   Code Status: Full Code  Home/SNF/Other Home Patient oriented to: self, place, time, and situation Is this baseline? Yes   Triage Complete: Triage complete  Chief Complaint Viral pneumonia [J12.9]  Triage Note Pt BIB EMS, ShOB since 8pm. Hx of COPD, no sick contacts. 86% on 2LNC, 99% on 15LNRB. No hx of CHF, but has been told he has had "fluid on his lungs".   Allergies Allergies  Allergen Reactions   Hydrocodone Other (See Comments)    "given to him in the hospital; went thru withdrawals once home; dr said not to take it again" (09/27/2012) dizziness   Ultram [Tramadol] Other (See Comments)    "given to him in the hospital; went thru withdrawals once home; dr said not to take it again" (09/27/2012) dizziness   Cialis [Tadalafil] Other (See Comments)    Headache Backache  Dizziness    Avelox [Moxifloxacin Hydrochloride] Itching and Other (See Comments)    Headache Dizziness    Level of Care/Admitting Diagnosis ED Disposition     ED Disposition  Admit   Condition  --   Comment  Hospital Area: [facility] [[id number]]  Level of Care: Med-Surg [16]  May place patient in observation at [facility] or [facility] if equivalent level of care is available:: Yes  Covid Evaluation: Confirmed COVID Negative  Diagnosis: Viral pneumonia [[id number]]  Admitting Physician: Maretta Bees [[id number]]  Attending Physician: Maretta Bees [[id number]]          B Medical/Surgery History Past Medical History:  Diagnosis Date   Abnormal CT scan, chest 2012   CT chest, several lymphadenopathies. Sees pulmonary   Anemia    intermittent   Arthritis    "?back" (09/19/2018)   Atrial fibrillation (HCC)    s/p AV node ablation & BiV PPM implantation 09/08/11 (op dictation pending)   BPH (benign prostatic  hyperplasia)    Saw Dr [name] 2004, normal renal u/s   Bronchitis 08/26/2017   CAD (coronary artery disease)    CHF (congestive heart failure) (HCC)    Thought primarily to be non-systolic although EF down (EF 65-78% 12/2010, down to 35-40% 09/05/11), cath 2008 with no CAD, nuclear study 07/2011 showing Small area of reversibility in the distal ant/lat wall the left ventricle suspicious for ischemia/septal wall HK but felt to be low risk  (per D/C Summary 07/2011)   Chronic bronchitis (HCC)    "get it ~ q yr"   Chronic diastolic heart failure (HCC)         Chronic respiratory failure with hypoxia and hypercapnia (HCC) 09/14/2010   Followed in Pulmonary clinic/ [facility]/ [facility]  Started on 02 2lpm at discharge 09/23/11   - PFT's 10/28/2011  FEV1  1.40 (51%)  with ratio 70 and no better p B2 and DLCO 53 corrects to 101    - PFT's 10/18/2014    FEV1 1.71 (59%) with ratio 68 and no sign change p B2 and DLCO 53 corrects to 85  -  HC03   07/28/20  = 33  -  HCO3   03/17/21     = 31     Heart murmur    HLD (hyperlipidemia)    HTN (hypertension)    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   Pleural effusion 2008   S/p decortication   Pneumonia 08/02/2011   Pulmonary HTN (HCC)    per cath 2008   Type II diabetes mellitus (HCC) 1999   Past Surgical History:  Procedure Laterality Date   BI-VENTRICULAR PACEMAKER INSERTION Left 09/08/2011   Procedure: BI-VENTRICULAR PACEMAKER INSERTION (CRT-P);  Surgeon: Marinus Maw, MD;  Location: [facility] CATH LAB;  Service: Cardiovascular;  Laterality: Left;   BIV PACEMAKER GENERATOR CHANGEOUT N/A 11/10/2022   Procedure: BIV PACEMAKER GENERATOR CHANGEOUT;  Surgeon: Marinus Maw, MD;  Location: [facility] INVASIVE CV LAB;  Service: Cardiovascular;  Laterality: N/A;   CARDIAC  CATHETERIZATION     "couple times; never had balloon or stent" (09/19/2018)   CATARACT EXTRACTION W/ INTRAOCULAR LENS  IMPLANT, BILATERAL Bilateral 08/2018   COLONOSCOPY  03/10/11   normal   INSERT / REPLACE / REMOVE PACEMAKER  09/08/11   pacemaker placement   LUNG DECORTICATION     PLEURAL SCARIFICATION     pneumothorax with fibrothorax  ~ 2010   RIGHT/LEFT HEART CATH AND CORONARY ANGIOGRAPHY N/A 10/05/2022   Procedure: RIGHT/LEFT HEART CATH AND CORONARY ANGIOGRAPHY;  Surgeon: Corky Crafts, MD;  Location[facility] INVASIVE CV LAB;  Service: Cardiovascular;  Laterality: N/A;   TONSILLECTOMY     "as a kid"      A IV Location/Drains/Wounds Patient Lines/Drains/Airways Status     Active Line/Drains/Airways     Name Placement date Placement time Site Days   Peripheral IV 05/25/23 20 G 1.16" Anterior;Right Forearm 05/25/23  2336  Forearm  1            Intake/Output Last 24 hours No intake or output data in the 24 hours ending 05/26/23 0747  Labs/Imaging Results for orders placed or performed during the hospital encounter of 05/25/23 (from the past 48 hour(s))  CBC with Differential     Status: Abnormal   Collection Time: 05/26/23 12:31 AM  Result Value Ref Range   WBC 14.9 (H) 4.0 - 10.5 K/uL   RBC 4.42 4.22 - 5.81 MIL/uL   Hemoglobin 13.6 13.0 - 17.0 g/dL   HCT 16.1 09.6 - 04.5 %   MCV 92.8 80.0 - 100.0 fL   MCH 30.8 26.0 - 34.0 pg   MCHC 33.2 30.0 - 36.0 g/dL   RDW 40.9 81.1 - 91.4 %   Platelets 186 150 - 400 K/uL   nRBC 0.0 0.0 - 0.2 %   Neutrophils Relative % 88 %   Neutro Abs 13.2 (H) 1.7 - 7.7 K/uL   Lymphocytes Relative 5 %   Lymphs Abs 0.8 0.7 - 4.0 K/uL   Monocytes Relative 6 %   Monocytes Absolute 0.8 0.1 - 1.0 K/uL   Eosinophils Relative 0 %   Eosinophils Absolute 0.0 0.0 - 0.5 K/uL   Basophils Relative 0 %   Basophils Absolute 0.0 0.0 - 0.1 K/uL   Immature Granulocytes 1 %   Abs Immature Granulocytes 0.08 (H) 0.00 - 0.07 K/uL    Comment: Performed  at [facility], [address] [place], [place] [address]  Basic metabolic panel     Status: Abnormal   Collection Time: 05/26/23 12:31 AM  Result Value Ref Range   Sodium 139 135 - 145 mmol/L   Potassium 3.9 3.5 - 5.1 mmol/L  Chloride 100 98 - 111 mmol/L   CO2 28 22 - 32 mmol/L   Glucose, Bld 142 (H) 70 - 99 mg/dL    Comment: Glucose reference range applies only to samples taken after fasting for at least 8 hours.   BUN 13 8 - 23 mg/dL   Creatinine, Ser 1.32 0.61 - 1.24 mg/dL   Calcium 8.8 (L) 8.9 - 10.3 mg/dL   GFR, Estimated >44 >01 mL/min    Comment: (NOTE) Calculated using the CKD-EPI Creatinine Equation (2021)    Anion gap 11 5 - 15    Comment: Performed at [facility], [address] [place], [place] [address]  Troponin I (High Sensitivity)     Status: None   Collection Time: 05/26/23 12:31 AM  Result Value Ref Range   Troponin I (High Sensitivity) 14 <18 ng/L    Comment: (NOTE) Elevated high sensitivity troponin I (hsTnI) values and significant  changes across serial measurements may suggest ACS but many other  chronic and acute conditions are known to elevate hsTnI results.  Refer to the "Links" section for chest pain algorithms and additional  guidance. Performed at [facility], [address] [place], [place] [address]   Brain natriuretic peptide     Status: Abnormal   Collection Time: 05/26/23 12:31 AM  Result Value Ref Range   B Natriuretic Peptide 437.6 (H) 0.0 - 100.0 pg/mL    Comment: Performed at [facility], [address], [place], [place] [address]  Resp panel by RT-PCR (RSV, Flu A&B, Covid) Anterior Nasal Swab     Status: None   Collection Time: 05/26/23 12:35 AM   Specimen: Anterior Nasal Swab  Result Value Ref Range   SARS Coronavirus 2 by RT PCR NEGATIVE NEGATIVE   Influenza A by PCR NEGATIVE NEGATIVE   Influenza B by PCR NEGATIVE NEGATIVE    Comment: (NOTE) The Xpert Xpress SARS-CoV-2/FLU/RSV plus assay is intended  as an aid in the diagnosis of influenza from Nasopharyngeal swab specimens and should not be used as a sole basis for treatment. Nasal washings and aspirates are unacceptable for Xpert Xpress SARS-CoV-2/FLU/RSV testing.  Fact Sheet for Patients: BloggerCourse.com  Fact Sheet for Healthcare Providers: SeriousBroker.it  This test is not yet approved or cleared by the [place] FDA and has been authorized for detection and/or diagnosis of SARS-CoV-2 by FDA under an Emergency Use Authorization (EUA). This EUA will remain in effect (meaning this test can be used) for the duration of the COVID-19 declaration under Section 564(b)(1) of the Act, 21 U.S.C. section 360bbb-3(b)(1), unless the authorization is terminated or revoked.     Resp Syncytial Virus by PCR NEGATIVE NEGATIVE    Comment: (NOTE) Fact Sheet for Patients: BloggerCourse.com  Fact Sheet for Healthcare Providers: SeriousBroker.it  This test is not yet approved or cleared by the [place] FDA and has been authorized for detection and/or diagnosis of SARS-CoV-2 by FDA under an Emergency Use Authorization (EUA). This EUA will remain in effect (meaning this test can be used) for the duration of the COVID-19 declaration under Section 564(b)(1) of the Act, 21 U.S.C. section 360bbb-3(b)(1), unless the authorization is terminated or revoked.  Performed at [facility], [address] [place], [place] [address]   Troponin I (High Sensitivity)     Status: None   Collection Time: 05/26/23  1:35 AM  Result Value Ref Range   Troponin I (High Sensitivity) 13 <18 ng/L    Comment: (NOTE)  Elevated high sensitivity troponin I (hsTnI) values and significant  changes across serial measurements may suggest ACS but many other  chronic and acute conditions are known to elevate hsTnI results.  Refer to the "Links" section for  chest pain algorithms and additional  guidance. Performed at [facility], [address], [place], [place] [address]   Urinalysis, Routine w reflex microscopic -Urine, Clean Catch     Status: Abnormal   Collection Time: 05/26/23  2:44 AM  Result Value Ref Range   Color, Urine YELLOW YELLOW   APPearance CLEAR CLEAR   Specific Gravity, Urine 1.016 1.005 - 1.030   pH 5.0 5.0 - 8.0   Glucose, UA >=500 (A) NEGATIVE mg/dL   Hgb urine dipstick NEGATIVE NEGATIVE   Bilirubin Urine NEGATIVE NEGATIVE   Ketones, ur NEGATIVE NEGATIVE mg/dL   Protein, ur NEGATIVE NEGATIVE mg/dL   Nitrite NEGATIVE NEGATIVE   Leukocytes,Ua NEGATIVE NEGATIVE   RBC / HPF 0-5 0 - 5 RBC/hpf   WBC, UA 0-5 0 - 5 WBC/hpf   Bacteria, UA NONE SEEN NONE SEEN   Squamous Epithelial / HPF 0-5 0 - 5 /HPF    Comment: Performed at [facility], [address], [place], [place] [address]  Respiratory (~20 pathogens) panel by PCR     Status: None   Collection Time: 05/26/23  5:15 AM   Specimen: Nasopharyngeal Swab; Respiratory  Result Value Ref Range   Adenovirus NOT DETECTED NOT DETECTED   Coronavirus 229E NOT DETECTED NOT DETECTED    Comment: (NOTE) The Coronavirus on the Respiratory Panel, DOES NOT test for the novel  Coronavirus (2019 nCoV)    Coronavirus HKU1 NOT DETECTED NOT DETECTED   Coronavirus NL63 NOT DETECTED NOT DETECTED   Coronavirus OC43 NOT DETECTED NOT DETECTED   Metapneumovirus NOT DETECTED NOT DETECTED   Rhinovirus / Enterovirus NOT DETECTED NOT DETECTED   Influenza A NOT DETECTED NOT DETECTED   Influenza B NOT DETECTED NOT DETECTED   Parainfluenza Virus 1 NOT DETECTED NOT DETECTED   Parainfluenza Virus 2 NOT DETECTED NOT DETECTED   Parainfluenza Virus 3 NOT DETECTED NOT DETECTED   Parainfluenza Virus 4 NOT DETECTED NOT DETECTED   Respiratory Syncytial Virus NOT DETECTED NOT DETECTED   Bordetella pertussis NOT DETECTED NOT DETECTED   Bordetella Parapertussis NOT DETECTED NOT DETECTED    Chlamydophila pneumoniae NOT DETECTED NOT DETECTED   Mycoplasma pneumoniae NOT DETECTED NOT DETECTED    Comment: Performed at [facility], [address], [place], [place] [address]   *Note: Due to a large number of results and/or encounters for the requested time period, some results have not been displayed. A complete set of results can be found in Results Review.   CT Angio Chest PE W and/or Wo Contrast  Result Date: 05/26/2023 CLINICAL DATA:  Shortness of breath EXAM: CT ANGIOGRAPHY CHEST WITH CONTRAST TECHNIQUE: Multidetector CT imaging of the chest was performed using the standard protocol during bolus administration of intravenous contrast. Multiplanar CT image reconstructions and MIPs were obtained to evaluate the vascular anatomy. RADIATION DOSE REDUCTION: This exam was performed according to the departmental dose-optimization program which includes automated exposure control, adjustment of the mA and/or kV according to patient size and/or use of iterative reconstruction technique. CONTRAST:  75mL OMNIPAQUE IOHEXOL 350 MG/ML SOLN COMPARISON:  Chest x-ray from earlier in the same day. FINDINGS: Cardiovascular: Atherosclerotic calcifications of the thoracic aorta are noted. No aneurysmal dilatation is seen. No filling defect to suggest pulmonary  embolism is noted. Coronary calcifications are seen. Pacing device is again noted. Mediastinum/Nodes: Thoracic inlet is within normal limits. No hilar or mediastinal adenopathy is noted. The esophagus as visualized is within normal limits. Lungs/Pleura: Lungs are well aerated bilaterally. Mosaic attenuation is noted consistent with a degree of air trapping. Mild basilar atelectatic changes are noted. No sizable effusion is seen. Upper Abdomen: Visualized upper abdomen shows no acute abnormality. Simple renal cyst is noted on the left stable from prior exam. No further follow-up is recommended. Musculoskeletal: Degenerative changes of the thoracic spine  are noted. No rib abnormality is noted. Review of the MIP images confirms the above findings. IMPRESSION: No evidence of pulmonary embolism. Mild basilar atelectasis. Aortic aneurysm NOS (ICD10-I71.9). Electronically Signed   By: Alcide Clever M.D.   On: 05/26/2023 02:02   DG Chest Portable 1 View  Result Date: 05/26/2023 CLINICAL DATA:  Shortness of breath. EXAM: PORTABLE CHEST 1 VIEW COMPARISON:  10/01/2022. FINDINGS: The heart is enlarged and the mediastinal contour is within normal limits. Atherosclerotic calcification of the aorta is noted. The pulmonary vasculature is distended. Interstitial prominence is present bilaterally and there is mild airspace disease at the left lung base. There are small bilateral pleural effusions. No pneumothorax. A dual lead pacemaker is present over the left chest. No acute osseous abnormality. IMPRESSION: 1. Cardiomegaly with pulmonary vascular congestion. 2. Interstitial prominence bilaterally with patchy airspace disease at the left lung base, likely edema. 3. Small bilateral pleural effusions. Electronically Signed   By: Thornell Sartorius M.D.   On: 05/26/2023 00:24    Pending Labs Unresulted Labs (From admission, onward)     Start     Ordered   06/02/23 0500  Creatinine, serum  (enoxaparin (LOVENOX)    CrCl >/= 30 ml/min)  Weekly,   R     Comments: while on enoxaparin therapy    05/26/23 0727   05/27/23 0500  Basic metabolic panel  Tomorrow morning,   R        05/26/23 0727   05/27/23 0500  CBC  Tomorrow morning,   R        05/26/23 0727   05/26/23 0729  Hemoglobin A1c  Once,   R       Comments: To assess prior glycemic control    05/26/23 0728   05/26/23 0727  CBC  (enoxaparin (LOVENOX)    CrCl >/= 30 ml/min)  Once,   R       Comments: Baseline for enoxaparin therapy IF NOT ALREADY DRAWN.  Notify MD if PLT < 100 K.    05/26/23 0727   05/26/23 0727  Creatinine, serum  (enoxaparin (LOVENOX)    CrCl >/= 30 ml/min)  Once,   R       Comments: Baseline for  enoxaparin therapy IF NOT ALREADY DRAWN.    05/26/23 0727   05/26/23 0227  Urine Culture  Once,   URGENT       Question Answer Comment  Indication Dysuria   Patient immune status Normal   Release to patient Immediate      05/26/23 0226            Vitals/Pain Today's Vitals   05/26/23 0315 05/26/23 0515 05/26/23 0518 05/26/23 0700  BP: 118/65 112/67  105/60  Pulse: 68 70  70  Resp: (!) 33 (!) 25  (!) 24  Temp:   97.6 F (36.4 C)   TempSrc:   Oral   SpO2: 97% 97%  95%  PainSc:  Isolation Precautions Droplet precaution  Medications Medications  amLODipine (NORVASC) tablet 10 mg (has no administration in time range)  atorvastatin (LIPITOR) tablet 40 mg (has no administration in time range)  bisoprolol (ZEBETA) tablet 10 mg (has no administration in time range)  zolpidem (AMBIEN) tablet 10 mg (has no administration in time range)  dapagliflozin propanediol (FARXIGA) tablet 10 mg (has no administration in time range)  tamsulosin (FLOMAX) capsule 0.4 mg (has no administration in time range)  pantoprazole (PROTONIX) EC tablet 40 mg (has no administration in time range)  budesonide (PULMICORT) nebulizer solution 0.5 mg (has no administration in time range)  ipratropium-albuterol (DUONEB) 0.5-2.5 (3) MG/3ML nebulizer solution 3 mL (has no administration in time range)  albuterol (PROVENTIL) (2.5 MG/3ML) 0.083% nebulizer solution 2.5 mg (has no administration in time range)  enoxaparin (LOVENOX) injection 40 mg (has no administration in time range)  sodium chloride flush (NS) 0.9 % injection 3 mL (has no administration in time range)  acetaminophen (TYLENOL) tablet 650 mg (has no administration in time range)    Or  acetaminophen (TYLENOL) suppository 650 mg (has no administration in time range)  ondansetron (ZOFRAN) tablet 4 mg (has no administration in time range)    Or  ondansetron (ZOFRAN) injection 4 mg (has no administration in time range)  insulin aspart  (novoLOG) injection 0-9 Units (has no administration in time range)  insulin aspart (novoLOG) injection 0-5 Units (has no administration in time range)  methylPREDNISolone sodium succinate (SOLU-MEDROL) 40 mg/mL injection 40 mg (has no administration in time range)  iohexol (OMNIPAQUE) 350 MG/ML injection 75 mL (75 mLs Intravenous Contrast Given 05/26/23 0155)  acetaminophen (TYLENOL) tablet 1,000 mg (1,000 mg Oral Given 05/26/23 0235)  ipratropium-albuterol (DUONEB) 0.5-2.5 (3) MG/3ML nebulizer solution 3 mL (3 mLs Nebulization Given 05/26/23 0235)  cefTRIAXone (ROCEPHIN) 1 g in sodium chloride 0.9 % 100 mL IVPB (0 g Intravenous Stopped 05/26/23 0311)  albuterol (PROVENTIL) (2.5 MG/3ML) 0.083% nebulizer solution 5 mg (5 mg Nebulization Given 05/26/23 0514)    Mobility walks     Focused Assessments Pulmonary Assessment Handoff:  Lung sounds: L Breath Sounds: Diminished R Breath Sounds: Diminished O2 Device: Nasal Cannula O2 Flow Rate (L/min): 2 L/min    R Recommendations: See Admitting Provider Note  Report given to:   Additional Notes:

## 2023-05-26 NOTE — ED Provider Notes (Signed)
[facility] AT [facility] Provider Note   CSN: [id number] Arrival date & time: 05/25/23  2335     History  Chief Complaint  Patient presents with   Shortness of Breath    Bradley [name] Sr. is a 82 y.o. male.  The history is provided by the patient and the spouse.  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      Past Medical History:  Diagnosis Date   Abnormal CT scan, chest 2012   CT chest, several lymphadenopathies. Sees pulmonary   Anemia    intermittent   Arthritis    "?back" (09/19/2018)   Atrial fibrillation (HCC)    s/p AV node ablation & BiV PPM implantation 09/08/11 (op dictation pending)   BPH (benign prostatic hyperplasia)    Saw Dr [name] 2004, normal renal u/s   Bronchitis 08/26/2017   CAD (coronary artery disease)    CHF (congestive heart failure) (HCC)    Thought primarily to be non-systolic although EF down (EF 40-98% 12/2010, down to 35-40% 09/05/11), cath 2008 with no CAD, nuclear study 07/2011 showing Small area of reversibility in the distal ant/lat wall the left ventricle suspicious for ischemia/septal wall HK but felt to be low risk  (per D/C Summary 07/2011)   Chronic bronchitis (HCC)    "get it ~ q yr"   Chronic diastolic heart failure (HCC)         Chronic respiratory failure with hypoxia and hypercapnia (HCC) 09/14/2010   Followed in Pulmonary clinic/ [facility]/ [place]  Started on 02 2lpm at discharge 09/23/11   - PFT's 10/28/2011  FEV1  1.40 (51%)  with ratio 70 and no better p B2 and DLCO 53 corrects to 101    - PFT's 10/18/2014    FEV1 1.71 (59%) with ratio 68 and no sign change  p B2 and DLCO 53 corrects to 85  -  HC03   07/28/20  = 33  -  HCO3   03/17/21     = 31     Heart murmur    HLD (hyperlipidemia)    HTN (hypertension)    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   Pleural effusion 2008   S/p decortication   Pneumonia 08/02/2011   Pulmonary HTN (HCC)    per cath 2008   Type II diabetes mellitus (HCC) 1999     Home Medications Prior to Admission medications   Medication Sig Start Date End Date Taking? Authorizing Provider  acetaminophen (TYLENOL) 500 MG tablet Take 1,000 mg by mouth every 8 (eight) hours as needed for headache or moderate pain.    [provider]  albuterol (VENTOLIN HFA) 108 (90 Base) MCG/ACT inhaler Inhale 2 puffs into the lungs every 6 (six) hours as needed for wheezing or shortness of breath. 03/11/21   Ghimire, Werner Lean, MD  amLODipine (NORVASC) 10 MG tablet TAKE 1 TABLET DAILY 11/30/22   Paz,  Nolon Rod, MD  aspirin 81 MG tablet Take 81 mg by mouth every morning.     [provider]  atorvastatin (LIPITOR) 40 MG tablet Take 1 tablet (40 mg total) by mouth at bedtime. 10/21/22   Alver Sorrow, NP  bisoprolol (ZEBETA) 10 MG tablet TAKE 1 TABLET DAILY 05/09/23   [name], MD  budesonide (PULMICORT) 0.5 MG/2ML nebulizer solution Take 2 mLs (0.5 mg total) by nebulization 2 (two) times daily. 08/02/22   [name], MD  calcium-vitamin D (OSCAL WITH D) 500-200 MG-UNIT tablet Take 1 tablet by mouth every morning.    [provider]  cholecalciferol (VITAMIN D3) 25 MCG (1000 UNIT) tablet Take 1,000 Units by mouth every evening.    [provider]  dapagliflozin propanediol (FARXIGA) 10 MG TABS tablet Take 1 tablet (10 mg total) by mouth daily before breakfast. 12/29/22   [name], MD  diphenhydramine-acetaminophen (TYLENOL PM)  25-500 MG TABS tablet Take 1 tablet by mouth at bedtime as needed (sleep).    [provider]  furosemide (LASIX) 40 MG tablet TAKE 2 TABLETS EVERY MORNING AND 1 TABLET LATE IN THE AFTERNOON 02/21/23   [name], MD  glucose blood (ONETOUCH VERIO) test strip USE TO CHECK BLOOD SUGAR NO MORE THAN TWICE A DAY 03/28/23   [name], MD  guaiFENesin (MUCINEX) 600 MG 12 hr tablet Take 600 mg by mouth 2 (two) times daily.    [provider]  ipratropium-albuterol (DUONEB) 0.5-2.5 (3) MG/3ML SOLN Take 3 mLs by nebulization every 6 (six) hours as needed. Patient taking differently: Take 3 mLs by nebulization in the morning and at bedtime. 08/02/22   [name], MD  Lancets [facility] ULTRASOFT) lancets Check blood sugars no more than twice daily 03/08/17   [name], MD  Multiple Vitamins-Minerals (MULTIVITAMINS THER. W/MINERALS) TABS tablet Take 1 tablet by mouth daily. 09/14/13   [name], MD  nitroGLYCERIN (NITROSTAT) 0.4 MG SL tablet Place 1 tablet (0.4 mg total) under the tongue every 5 (five) minutes x 3 doses as needed for chest pain. 09/07/21   [name], MD  OXYGEN Inhale 4 L/min into the lungs continuous.    [provider]  pantoprazole (PROTONIX) 40 MG tablet TAKE 1 TABLET DAILY BEFORE BREAKFAST 11/30/22   [name], MD  tamsulosin (FLOMAX) 0.4 MG CAPS capsule Take 1 capsule (0.4 mg total) by mouth daily after supper. 05/23/23   [name], MD  zolpidem (AMBIEN) 10 MG tablet Take 10 mg by mouth at bedtime as needed for sleep.    [provider]      Allergies    Hydrocodone, Ultram [tramadol], Cialis [tadalafil], and Avelox [moxifloxacin hydrochloride]    Review of Systems   Review of Systems  Constitutional:  Positive for fever.  HENT:  Negative for facial swelling.   Respiratory:  Positive for cough, shortness of breath and wheezing.   All other systems reviewed and are negative.   Physical Exam Updated Vital Signs BP 112/67   Pulse  70   Temp 97.6 F (36.4 C) (Oral)   Resp (!) 25   SpO2 97%  Physical Exam Vitals and nursing note reviewed.  Constitutional:      General: He is not in acute distress.    Appearance: He is well-developed. He is not diaphoretic.  HENT:     Head: Normocephalic and atraumatic.     Nose: Nose normal.  Eyes:  Conjunctiva/sclera: Conjunctivae normal.     Pupils: Pupils are equal, round, and reactive to light.  Cardiovascular:     Rate and Rhythm: Normal rate and regular rhythm.  Pulmonary:     Effort: Pulmonary effort is normal.     Breath sounds: Wheezing present. No rales.  Abdominal:     General: Bowel sounds are normal.     Palpations: Abdomen is soft.     Tenderness: There is no abdominal tenderness. There is no guarding or rebound.  Musculoskeletal:        General: Normal range of motion.     Cervical back: Normal range of motion and neck supple.  Skin:    General: Skin is warm and dry.     Capillary Refill: Capillary refill takes less than 2 seconds.  Neurological:     General: No focal deficit present.     Mental Status: He is alert and oriented to person, place, and time.     Deep Tendon Reflexes: Reflexes normal.  Psychiatric:        Mood and Affect: Mood normal.        Behavior: Behavior normal.     ED Results / Procedures / Treatments   Labs (all labs ordered are listed, but only abnormal results are displayed) Results for orders placed or performed during the hospital encounter of 05/25/23  Resp panel by RT-PCR (RSV, Flu A&B, Covid) Anterior Nasal Swab   Specimen: Anterior Nasal Swab  Result Value Ref Range   SARS Coronavirus 2 by RT PCR NEGATIVE NEGATIVE   Influenza A by PCR NEGATIVE NEGATIVE   Influenza B by PCR NEGATIVE NEGATIVE   Resp Syncytial Virus by PCR NEGATIVE NEGATIVE  CBC with Differential  Result Value Ref Range   WBC 14.9 (H) 4.0 - 10.5 K/uL   RBC 4.42 4.22 - 5.81 MIL/uL   Hemoglobin 13.6 13.0 - 17.0 g/dL   HCT 74.2 59.5 - 63.8 %   MCV  92.8 80.0 - 100.0 fL   MCH 30.8 26.0 - 34.0 pg   MCHC 33.2 30.0 - 36.0 g/dL   RDW 75.6 43.3 - 29.5 %   Platelets 186 150 - 400 K/uL   nRBC 0.0 0.0 - 0.2 %   Neutrophils Relative % 88 %   Neutro Abs 13.2 (H) 1.7 - 7.7 K/uL   Lymphocytes Relative 5 %   Lymphs Abs 0.8 0.7 - 4.0 K/uL   Monocytes Relative 6 %   Monocytes Absolute 0.8 0.1 - 1.0 K/uL   Eosinophils Relative 0 %   Eosinophils Absolute 0.0 0.0 - 0.5 K/uL   Basophils Relative 0 %   Basophils Absolute 0.0 0.0 - 0.1 K/uL   Immature Granulocytes 1 %   Abs Immature Granulocytes 0.08 (H) 0.00 - 0.07 K/uL  Basic metabolic panel  Result Value Ref Range   Sodium 139 135 - 145 mmol/L   Potassium 3.9 3.5 - 5.1 mmol/L   Chloride 100 98 - 111 mmol/L   CO2 28 22 - 32 mmol/L   Glucose, Bld 142 (H) 70 - 99 mg/dL   BUN 13 8 - 23 mg/dL   Creatinine, Ser 1.88 0.61 - 1.24 mg/dL   Calcium 8.8 (L) 8.9 - 10.3 mg/dL   GFR, Estimated >41 >66 mL/min   Anion gap 11 5 - 15  Brain natriuretic peptide  Result Value Ref Range   B Natriuretic Peptide 437.6 (H) 0.0 - 100.0 pg/mL  Urinalysis, Routine w reflex microscopic -Urine, Clean Catch  Result Value  Ref Range   Color, Urine YELLOW YELLOW   APPearance CLEAR CLEAR   Specific Gravity, Urine 1.016 1.005 - 1.030   pH 5.0 5.0 - 8.0   Glucose, UA >=500 (A) NEGATIVE mg/dL   Hgb urine dipstick NEGATIVE NEGATIVE   Bilirubin Urine NEGATIVE NEGATIVE   Ketones, ur NEGATIVE NEGATIVE mg/dL   Protein, ur NEGATIVE NEGATIVE mg/dL   Nitrite NEGATIVE NEGATIVE   Leukocytes,Ua NEGATIVE NEGATIVE   RBC / HPF 0-5 0 - 5 RBC/hpf   WBC, UA 0-5 0 - 5 WBC/hpf   Bacteria, UA NONE SEEN NONE SEEN   Squamous Epithelial / HPF 0-5 0 - 5 /HPF  Troponin I (High Sensitivity)  Result Value Ref Range   Troponin I (High Sensitivity) 14 <18 ng/L  Troponin I (High Sensitivity)  Result Value Ref Range   Troponin I (High Sensitivity) 13 <18 ng/L   *Note: Due to a large number of results and/or encounters for the requested time  period, some results have not been displayed. A complete set of results can be found in Results Review.   CT Angio Chest PE W and/or Wo Contrast  Result Date: 05/26/2023 CLINICAL DATA:  Shortness of breath EXAM: CT ANGIOGRAPHY CHEST WITH CONTRAST TECHNIQUE: Multidetector CT imaging of the chest was performed using the standard protocol during bolus administration of intravenous contrast. Multiplanar CT image reconstructions and MIPs were obtained to evaluate the vascular anatomy. RADIATION DOSE REDUCTION: This exam was performed according to the departmental dose-optimization program which includes automated exposure control, adjustment of the mA and/or kV according to patient size and/or use of iterative reconstruction technique. CONTRAST:  75mL OMNIPAQUE IOHEXOL 350 MG/ML SOLN COMPARISON:  Chest x-ray from earlier in the same day. FINDINGS: Cardiovascular: Atherosclerotic calcifications of the thoracic aorta are noted. No aneurysmal dilatation is seen. No filling defect to suggest pulmonary embolism is noted. Coronary calcifications are seen. Pacing device is again noted. Mediastinum/Nodes: Thoracic inlet is within normal limits. No hilar or mediastinal adenopathy is noted. The esophagus as visualized is within normal limits. Lungs/Pleura: Lungs are well aerated bilaterally. Mosaic attenuation is noted consistent with a degree of air trapping. Mild basilar atelectatic changes are noted. No sizable effusion is seen. Upper Abdomen: Visualized upper abdomen shows no acute abnormality. Simple renal cyst is noted on the left stable from prior exam. No further follow-up is recommended. Musculoskeletal: Degenerative changes of the thoracic spine are noted. No rib abnormality is noted. Review of the MIP images confirms the above findings. IMPRESSION: No evidence of pulmonary embolism. Mild basilar atelectasis. Aortic aneurysm NOS (ICD10-I71.9). Electronically Signed   By: Alcide Clever M.D.   On: 05/26/2023 02:02    DG Chest Portable 1 View  Result Date: 05/26/2023 CLINICAL DATA:  Shortness of breath. EXAM: PORTABLE CHEST 1 VIEW COMPARISON:  10/01/2022. FINDINGS: The heart is enlarged and the mediastinal contour is within normal limits. Atherosclerotic calcification of the aorta is noted. The pulmonary vasculature is distended. Interstitial prominence is present bilaterally and there is mild airspace disease at the left lung base. There are small bilateral pleural effusions. No pneumothorax. A dual lead pacemaker is present over the left chest. No acute osseous abnormality. IMPRESSION: 1. Cardiomegaly with pulmonary vascular congestion. 2. Interstitial prominence bilaterally with patchy airspace disease at the left lung base, likely edema. 3. Small bilateral pleural effusions. Electronically Signed   By: Thornell Sartorius M.D.   On: 05/26/2023 00:24   DG Wrist Complete Left  Result Date: 05/14/2023 CLINICAL DATA:  Wrist pain due  to fall yesterday. Initial encounter. EXAM: LEFT WRIST - COMPLETE 3 VIEW COMPARISON:  None Available. FINDINGS: Transverse fracture through the distal radius with mild dorsal impaction. The fracture is extra-articular. Remote and healed third metacarpal shaft fracture. Subjective osteopenia. IMPRESSION: Distal radius fracture with mild dorsal impaction. Electronically Signed   By: Tiburcio Pea M.D.   On: 05/14/2023 05:06     EKG EKG Interpretation Date/Time:  Thursday May 26 2023 00:07:04 EDT Ventricular Rate:  79 PR Interval:    QRS Duration:  128 QT Interval:  381 QTC Calculation: 437 R Axis:   263  Text Interpretation: VENTRICULAR PACED RHYTHM Paired ventricular premature complexes RBBB and LAFB Baseline wander in lead(s) V3 Confirmed by Nicanor Alcon,  ([id number]) on 05/26/2023 12:34:20 AM  Radiology CT Angio Chest PE W and/or Wo Contrast  Result Date: 05/26/2023 CLINICAL DATA:  Shortness of breath EXAM: CT ANGIOGRAPHY CHEST WITH CONTRAST TECHNIQUE: Multidetector CT imaging of  the chest was performed using the standard protocol during bolus administration of intravenous contrast. Multiplanar CT image reconstructions and MIPs were obtained to evaluate the vascular anatomy. RADIATION DOSE REDUCTION: This exam was performed according to the departmental dose-optimization program which includes automated exposure control, adjustment of the mA and/or kV according to patient size and/or use of iterative reconstruction technique. CONTRAST:  75mL OMNIPAQUE IOHEXOL 350 MG/ML SOLN COMPARISON:  Chest x-ray from earlier in the same day. FINDINGS: Cardiovascular: Atherosclerotic calcifications of the thoracic aorta are noted. No aneurysmal dilatation is seen. No filling defect to suggest pulmonary embolism is noted. Coronary calcifications are seen. Pacing device is again noted. Mediastinum/Nodes: Thoracic inlet is within normal limits. No hilar or mediastinal adenopathy is noted. The esophagus as visualized is within normal limits. Lungs/Pleura: Lungs are well aerated bilaterally. Mosaic attenuation is noted consistent with a degree of air trapping. Mild basilar atelectatic changes are noted. No sizable effusion is seen. Upper Abdomen: Visualized upper abdomen shows no acute abnormality. Simple renal cyst is noted on the left stable from prior exam. No further follow-up is recommended. Musculoskeletal: Degenerative changes of the thoracic spine are noted. No rib abnormality is noted. Review of the MIP images confirms the above findings. IMPRESSION: No evidence of pulmonary embolism. Mild basilar atelectasis. Aortic aneurysm NOS (ICD10-I71.9). Electronically Signed   By: Alcide Clever M.D.   On: 05/26/2023 02:02   DG Chest Portable 1 View  Result Date: 05/26/2023 CLINICAL DATA:  Shortness of breath. EXAM: PORTABLE CHEST 1 VIEW COMPARISON:  10/01/2022. FINDINGS: The heart is enlarged and the mediastinal contour is within normal limits. Atherosclerotic calcification of the aorta is noted. The  pulmonary vasculature is distended. Interstitial prominence is present bilaterally and there is mild airspace disease at the left lung base. There are small bilateral pleural effusions. No pneumothorax. A dual lead pacemaker is present over the left chest. No acute osseous abnormality. IMPRESSION: 1. Cardiomegaly with pulmonary vascular congestion. 2. Interstitial prominence bilaterally with patchy airspace disease at the left lung base, likely edema. 3. Small bilateral pleural effusions. Electronically Signed   By: Thornell Sartorius M.D.   On: 05/26/2023 00:24    Procedures Procedures    Medications Ordered in ED Medications  iohexol (OMNIPAQUE) 350 MG/ML injection 75 mL (75 mLs Intravenous Contrast Given 05/26/23 0155)  acetaminophen (TYLENOL) tablet 1,000 mg (1,000 mg Oral Given 05/26/23 0235)  ipratropium-albuterol (DUONEB) 0.5-2.5 (3) MG/3ML nebulizer solution 3 mL (3 mLs Nebulization Given 05/26/23 0235)  cefTRIAXone (ROCEPHIN) 1 g in sodium chloride 0.9 % 100 mL IVPB (0 g  Intravenous Stopped 05/26/23 0311)  albuterol (PROVENTIL) (2.5 MG/3ML) 0.083% nebulizer solution 5 mg (5 mg Nebulization Given 05/26/23 2952)    ED Course/ Medical Decision Making/ A&P                                 Medical Decision Making Patient with fever and SOB   Amount and/or Complexity of Data Reviewed Independent Historian: EMS    Details: See above  External Data Reviewed: notes.    Details: Previous notes reviewed  Labs: ordered.    Details: Negative covid and flu.  Sodium normal 139, potassium normal 3.9, creatinine normal.  Elevated white count 14.9, normal hemoglobin 13.6, normal platelets. Normal urine 2 negative.  troponins 14/13 Radiology: ordered.  Risk OTC drugs. Prescription drug management. Decision regarding hospitalization.    Final Clinical Impression(s) / ED Diagnoses Final diagnoses:  Thoracic aortic aneurysm without rupture, unspecified part (HCC)  Fever, unspecified fever cause   COPD exacerbation (HCC)   The patient appears reasonably stabilized for admission considering the current resources, flow, and capabilities available in the ED at this time, and I doubt any other [facility] requiring further screening and/or treatment in the ED prior to admission.  Rx / DC Orders ED Discharge Orders     None         , , MD 05/26/23 [phone]

## 2023-05-26 NOTE — TOC Initial Note (Signed)
Transition of Care (TOC) - Initial/Assessment Note    Patient Details  Name: Bradley MALANGA Sr. MRN: [id number] Date of Birth: 1941-07-13  Transition of Care [facility]) CM/SW Contact:    Harriet Masson, RN Phone Number: 05/26/2023, 2:45 PM  Clinical Narrative:                  Spoke to patient regarding transition needs. Patient lives with Granddaughter and spouse. Patient drives himself but has family that can help if needed. Patient has home 02 from [employer] at [employer]. TOC following.   Expected Discharge Plan: Home/Self Care Barriers to Discharge: Continued Medical Work up   Patient Goals and CMS Choice Patient states their goals for this hospitalization and ongoing recovery are:: return home          Expected Discharge Plan and Services                                              Prior Living Arrangements/Services   Lives with:: Spouse, Relatives (GRanddaughter) Patient language and need for interpreter reviewed:: Yes Do you feel safe going back to the place where you live?: Yes      Need for Family Participation in Patient Care: Yes (Comment) Care giver support system in place?: Yes (comment)   Criminal Activity/Legal Involvement Pertinent to Current Situation/Hospitalization: No - Comment as needed  Activities of Daily Living      Permission Sought/Granted                  Emotional Assessment Appearance:: Appears stated age Attitude/Demeanor/Rapport: Gracious Affect (typically observed): Accepting Orientation: : Oriented to Self, Oriented to Place, Oriented to  Time, Oriented to Situation Alcohol / Substance Use: Not Applicable Psych Involvement: No (comment)  Admission diagnosis:  COPD exacerbation (HCC) [J44.1] Viral pneumonia [J12.9] Fever, unspecified fever cause [R50.9] Thoracic aortic aneurysm without rupture, unspecified part (HCC) [I71.20] Patient Active Problem List   Diagnosis Date Noted   Viral pneumonia 05/26/2023   Acute on  chronic congestive heart failure (HCC) 10/05/2022   Acute respiratory failure with hypoxemia (HCC) 08/27/2022   COVID 08/23/2022   Displaced fracture of shaft of third metacarpal bone, left hand, sub encounter for closed fracture 08/03/2022   ILD (interstitial lung disease) (HCC) 07/30/2022   CAD (coronary artery disease) 07/19/2022   Acute on chronic respiratory failure with hypoxia (HCC) 07/18/2022   GERD without esophagitis 07/30/2021   Cor pulmonale (chronic) (HCC) 08/05/2020   COPD exacerbation (HCC) 12/22/2018   COPD,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 12/22/2018   PCP NOTES >>> 07/15/2015   Annual physical exam 05/08/2012   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   Sinoatrial node dysfunction (HCC)    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   Chronic diastolic heart failure (HCC)    PAD (peripheral artery disease) (HCC) 10/22/2010   Chronic dermatitis 09/14/2010   Chronic respiratory failure with hypoxia and hypercapnia (HCC) 09/14/2010   Chronic pulmonary hypertension secondary to elevated L H pressures  04/15/2009   ERECTILE DYSFUNCTION 01/06/2009   Mixed diabetic hyperlipidemia associated with type 2 diabetes mellitus (HCC) 12/13/2007   Essential hypertension 12/13/2007   Anxiety--insomnia 08/14/2007   Type 2 diabetes mellitus (HCC) 01/02/2007   Atrial fibrillation (HCC) 01/02/2007   Benign enlargement of prostate 01/02/2007   PCP:  [name], MD Pharmacy:   [facility] - [place], [place] -  [address] [place] [address] Phone: [phone] Fax: [phone]  [id number] - [place], [place] - [address] AT [facility] OF [address] [place] [address] Phone: [phone] Fax: [phone]     Social Determinants of Health (SDOH) Social History: SDOH Screenings   Food Insecurity: No Food Insecurity (10/14/2022)  Housing: Low Risk  (04/08/2023)  Transportation Needs: No  Transportation Needs (10/14/2022)  Utilities: Not At Risk (04/08/2023)  Alcohol Screen: Low Risk  (04/08/2023)  Depression (PHQ2-9): Low Risk  (04/08/2023)  Financial Resource Strain: Low Risk  (04/08/2023)  Physical Activity: Insufficiently Active (04/08/2023)  Social Connections: Moderately Integrated (04/08/2023)  Stress: No Stress Concern Present (04/08/2023)  Tobacco Use: Medium Risk (05/14/2023)   SDOH Interventions:     Readmission Risk Interventions    08/29/2022    3:33 PM 09/02/2021   10:31 AM 08/03/2021    9:10 AM  Readmission Risk Prevention Plan  Transportation Screening Complete Complete Complete  PCP or Specialist Appt within 5-7 Days   Complete  PCP or Specialist Appt within 3-5 Days Complete Complete   Home Care Screening   Complete  Medication Review (RN CM)   Complete  HRI or Home Care Consult Complete Complete   Social Work Consult for Recovery Care Planning/Counseling Complete Complete   Palliative Care Screening Not Applicable Not Applicable   Medication Review Oceanographer) Complete Complete

## 2023-05-26 NOTE — Plan of Care (Signed)

## 2023-05-26 NOTE — H&P (Signed)
History and Physical    Patient: Bradley [name] HYQ:[id number] DOB: 11-Feb-1941 DOA: 05/25/2023 DOS: the patient was seen and examined on 05/26/2023 PCP: [name], MD  Patient coming from: Home-wife resides in skilled nursing facility  Chief Complaint:  Chief Complaint  Patient presents with   Shortness of Breath   HPI: Bradley [name]. is a 82 y.o. male with medical history significant of atrial fibrillation status post ablation and permanent pacemaker for CRT, diabetes mellitus on oral medications, dyslipidemia, hypertension, chronic diastolic heart failure, COPD, pulmonary hypertension 50 mmHg.  Also has nonobstructive CAD and underwent a cardiac catheterization in 2023.  Patient had a fall outside of his wife's nursing home in August that resulted in a left distal radius fracture and currently has a cast in place.  Patient presented to the ER via EMS with shortness of breath since 8 PM on 8/14.  He has underlying COPD and is chronically on oxygen at 2 L/min.  EMS arrived to the home and noted that the patient was experiencing low O2 sats of 86% on 2 L of oxygen. He was brought to the ED on nonrebreather mask and sats had improved to 99%.  Upon presentation to the ER he was febrile with a temperature of 102.2, respirations were 30, he was in sinus rhythm, normotensive, O2 sats 98% on nonrebreather and was eventually aper down to 2 L/min.  PCR was negative, BNP was 437 with normal high-sensitivity troponin, white count was 14,900 with a left shift, chest x-ray was unremarkable, influenza and RSV were also negative.  Urinalysis unremarkable but a urine culture is pending.  Full respiratory viral panel was negative as well  Review of Systems:  Currently denies shortness of breath, chest pain.  Does report has had productive cough of clear phlegm for 5 days which has not his baseline.  States oral intake has been adequate.  Past Surgical History:  Procedure Laterality Date   BI-VENTRICULAR  PACEMAKER INSERTION Left 09/08/2011   Procedure: BI-VENTRICULAR PACEMAKER INSERTION (CRT-P);  Surgeon: Marinus Maw, MD;  Location: [facility] CATH LAB;  Service: Cardiovascular;  Laterality: Left;   BIV PACEMAKER GENERATOR CHANGEOUT N/A 11/10/2022   Procedure: BIV PACEMAKER GENERATOR CHANGEOUT;  Surgeon: Marinus Maw, MD;  Location: [facility] INVASIVE CV LAB;  Service: Cardiovascular;  Laterality: N/A;   CARDIAC CATHETERIZATION     "couple times; never had balloon or stent" (09/19/2018)   CATARACT EXTRACTION W/ INTRAOCULAR LENS  IMPLANT, BILATERAL Bilateral 08/2018   COLONOSCOPY  03/10/11   normal   INSERT / REPLACE / REMOVE PACEMAKER  09/08/11   pacemaker placement   LUNG DECORTICATION     PLEURAL SCARIFICATION     pneumothorax with fibrothorax  ~ 2010   RIGHT/LEFT HEART CATH AND CORONARY ANGIOGRAPHY N/A 10/05/2022   Procedure: RIGHT/LEFT HEART CATH AND CORONARY ANGIOGRAPHY;  Surgeon: Corky Crafts, MD;  Location: [facility] INVASIVE CV LAB;  Service: Cardiovascular;  Laterality: N/A;   TONSILLECTOMY     "as a kid"    Social History:  reports that he quit smoking about 42 years ago. His smoking use included cigarettes and cigars. He started smoking about 67 years ago. He has a 62.5 pack-year smoking history. He has never used smokeless tobacco. He reports that he does not currently use alcohol. He reports that he does not use drugs.  Allergies  Allergen Reactions   Hydrocodone Other (See Comments)    "given to him in the hospital; went thru withdrawals once home;  dr said not to take it again" (09/27/2012) dizziness   Ultram [Tramadol] Other (See Comments)    "given to him in the hospital; went thru withdrawals once home; dr said not to take it again" (09/27/2012) dizziness   Cialis [Tadalafil] Other (See Comments)    Headache Backache  Dizziness    Avelox [Moxifloxacin Hydrochloride] Itching and Other (See Comments)    Headache Dizziness    Family History  Problem Relation Age of Onset    Diabetes Father    Coronary artery disease Father    Polycythemia Mother    Drug abuse Son    Breast cancer Maternal Aunt    Prostate cancer Neg Hx    Colon cancer Neg Hx     Prior to Admission medications   Medication Sig Start Date End Date Taking? Authorizing Provider  acetaminophen (TYLENOL) 500 MG tablet Take 1,000 mg by mouth every 8 (eight) hours as needed for headache or moderate pain.    [provider]  albuterol (VENTOLIN HFA) 108 (90 Base) MCG/ACT inhaler Inhale 2 puffs into the lungs every 6 (six) hours as needed for wheezing or shortness of breath. 03/11/21   Ghimire, Werner Lean, MD  amLODipine (NORVASC) 10 MG tablet TAKE 1 TABLET DAILY 11/30/22   [name], MD  aspirin 81 MG tablet Take 81 mg by mouth every morning.     [provider]  atorvastatin (LIPITOR) 40 MG tablet Take 1 tablet (40 mg total) by mouth at bedtime. 10/21/22   Alver Sorrow, NP  bisoprolol (ZEBETA) 10 MG tablet TAKE 1 TABLET DAILY 05/09/23   [name], MD  budesonide (PULMICORT) 0.5 MG/2ML nebulizer solution Take 2 mLs (0.5 mg total) by nebulization 2 (two) times daily. 08/02/22   [name], MD  calcium-vitamin D (OSCAL WITH D) 500-200 MG-UNIT tablet Take 1 tablet by mouth every morning.    [provider]  cholecalciferol (VITAMIN D3) 25 MCG (1000 UNIT) tablet Take 1,000 Units by mouth every evening.    [provider]  dapagliflozin propanediol (FARXIGA) 10 MG TABS tablet Take 1 tablet (10 mg total) by mouth daily before breakfast. 12/29/22   [name], MD  diphenhydramine-acetaminophen (TYLENOL PM) 25-500 MG TABS tablet Take 1 tablet by mouth at bedtime as needed (sleep).    [provider]  furosemide (LASIX) 40 MG tablet TAKE 2 TABLETS EVERY MORNING AND 1 TABLET LATE IN THE AFTERNOON 02/21/23   [name], MD  glucose blood (ONETOUCH VERIO) test strip USE TO CHECK BLOOD SUGAR NO MORE THAN TWICE A DAY 03/28/23   [name], MD  guaiFENesin  (MUCINEX) 600 MG 12 hr tablet Take 600 mg by mouth 2 (two) times daily.    [provider]  ipratropium-albuterol (DUONEB) 0.5-2.5 (3) MG/3ML SOLN Take 3 mLs by nebulization every 6 (six) hours as needed. Patient taking differently: Take 3 mLs by nebulization in the morning and at bedtime. 08/02/22   [name], MD  Lancets [facility] ULTRASOFT) lancets Check blood sugars no more than twice daily 03/08/17   [name], MD  Multiple Vitamins-Minerals (MULTIVITAMINS THER. W/MINERALS) TABS tablet Take 1 tablet by mouth daily. 09/14/13   [name], MD  nitroGLYCERIN (NITROSTAT) 0.4 MG SL tablet Place 1 tablet (0.4 mg total) under the tongue every 5 (five) minutes x 3 doses as needed for chest pain. 09/07/21   [name], MD  OXYGEN Inhale 4 L/min into the lungs continuous.  [provider]  pantoprazole (PROTONIX) 40 MG tablet TAKE 1 TABLET DAILY BEFORE BREAKFAST 11/30/22   [name], MD  tamsulosin (FLOMAX) 0.4 MG CAPS capsule Take 1 capsule (0.4 mg total) by mouth daily after supper. 05/23/23   [name], MD  zolpidem (AMBIEN) 10 MG tablet Take 10 mg by mouth at bedtime as needed for sleep.    [provider]    Physical Exam: Vitals:   05/26/23 0518 05/26/23 0700 05/26/23 0800 05/26/23 0842  BP:  105/60 116/69 137/80  Pulse:  70 70 74  Resp:  (!) 24 (!) 23 19  Temp: 97.6 F (36.4 C)   97.6 F (36.4 C)  TempSrc: Oral   Oral  SpO2:  95% 98% 93%   Constitutional: NAD, calm, comfortable Respiratory: Diffuse crackles bilaterally on posterior exam.  Normal respiratory effort. No accessory muscle use at rest.  2 L nasal cannula O2 Cardiovascular: Regular rate and rhythm, no murmurs / rubs / gallops. No extremity edema. 2+ pedal pulses.   Abdomen: no tenderness, no masses palpated. No hepatosplenomegaly. Bowel sounds positive.  Musculoskeletal: no clubbing / cyanosis. No joint deformity upper and lower extremities. Good ROM, no contractures. Normal muscle tone.   Skin: no rashes, lesions, ulcers. No induration Neurologic: CN 2-12 grossly intact. Sensation intact, DTR normal. Strength 5/5 x all 4 extremities.  Psychiatric: Normal judgment and insight. Alert and oriented x 3. Normal mood.    Data Reviewed:  As per HPI  Assessment and Plan: Acute on chronic hypoxemia secondary to viral pneumonia Patient presented with leukocytosis and was febrile up to 102.2 F.  COVID flu RSV and full RVP negative Chest x-ray negative at this juncture so do not suspect bacterial pneumonia but will check procalcitonin He did receive 1 dose of Rocephin in the ED Treat underlying COPD  Chronic hypoxemia in context of COPD and pulmonary hypertension (50 mmHg) At home noted to have O2 sat 86% on baseline 2 L oxygen and was reporting shortness of breath Currently at rest symptoms have resolved Not wheezing but since has underlying viral pneumonia we will treat with Solu-Medrol 40 mg IV daily Continue home budesonide nebs, scheduled DuoNebs, and albuterol as needed BNP slightly elevated at 437 but has no peripheral edema but given crackles may benefit from one-time dose of IV Lasix.  Will defer to attending physician.  Low index of suspicion that this is related to volume overload given fever although hypoxia can cause transient exacerbation of heart failure  Diabetes mellitus 2 Resume home Farxiga and follow CBGs and provide SSI Check hemoglobin A1c  Chronic atrial fibrillation now maintaining sinus rhythm Status post ablation with subsequent placement of pacemaker for CRT No longer on chronic anticoagulation-continue baby aspirin Continue bisoprolol for rate control  Hypertension/diastolic heart failure Continue Norvasc Given febrile illness will hold Lasix for now-please see above regarding possibility of transient exacerbation of heart failure secondary to recent hypoxemia  HLD Continue Lipitor  Advance Care Planning:   Code Status: Full Code   VTE  prophylaxis: Lovenox  Consults: None  Family Communication: Patient only  Severity of Illness: The appropriate patient status for this patient is OBSERVATION. Observation status is judged to be reasonable and necessary in order to provide the required intensity of service to ensure the patient's safety. The patient's presenting symptoms, physical exam findings, and initial radiographic and laboratory data in the context of their medical condition is felt to place them at decreased risk for further clinical deterioration. Furthermore,  it is anticipated that the patient will be medically stable for discharge from the hospital within 2 midnights of admission.   Author: Junious Silk, NP 05/26/2023 8:46 AM  For on call review [email or web address].

## 2023-05-27 ENCOUNTER — Encounter (HOSPITAL_COMMUNITY): Payer: Self-pay | Admitting: Internal Medicine

## 2023-05-27 DIAGNOSIS — Z7951 Long term (current) use of inhaled steroids: Secondary | ICD-10-CM | POA: Diagnosis not present

## 2023-05-27 DIAGNOSIS — Z87891 Personal history of nicotine dependence: Secondary | ICD-10-CM | POA: Diagnosis not present

## 2023-05-27 DIAGNOSIS — I10 Essential (primary) hypertension: Secondary | ICD-10-CM

## 2023-05-27 DIAGNOSIS — Z961 Presence of intraocular lens: Secondary | ICD-10-CM | POA: Diagnosis present

## 2023-05-27 DIAGNOSIS — K219 Gastro-esophageal reflux disease without esophagitis: Secondary | ICD-10-CM | POA: Diagnosis present

## 2023-05-27 DIAGNOSIS — I4821 Permanent atrial fibrillation: Secondary | ICD-10-CM | POA: Diagnosis present

## 2023-05-27 DIAGNOSIS — I712 Thoracic aortic aneurysm, without rupture, unspecified: Secondary | ICD-10-CM

## 2023-05-27 DIAGNOSIS — J129 Viral pneumonia, unspecified: Secondary | ICD-10-CM | POA: Diagnosis not present

## 2023-05-27 DIAGNOSIS — N400 Enlarged prostate without lower urinary tract symptoms: Secondary | ICD-10-CM | POA: Diagnosis present

## 2023-05-27 DIAGNOSIS — I2510 Atherosclerotic heart disease of native coronary artery without angina pectoris: Secondary | ICD-10-CM | POA: Diagnosis present

## 2023-05-27 DIAGNOSIS — I5032 Chronic diastolic (congestive) heart failure: Secondary | ICD-10-CM | POA: Diagnosis present

## 2023-05-27 DIAGNOSIS — E119 Type 2 diabetes mellitus without complications: Secondary | ICD-10-CM | POA: Diagnosis present

## 2023-05-27 DIAGNOSIS — Z7982 Long term (current) use of aspirin: Secondary | ICD-10-CM | POA: Diagnosis not present

## 2023-05-27 DIAGNOSIS — Z950 Presence of cardiac pacemaker: Secondary | ICD-10-CM | POA: Diagnosis not present

## 2023-05-27 DIAGNOSIS — W19XXXD Unspecified fall, subsequent encounter: Secondary | ICD-10-CM | POA: Diagnosis present

## 2023-05-27 DIAGNOSIS — Z9981 Dependence on supplemental oxygen: Secondary | ICD-10-CM | POA: Diagnosis not present

## 2023-05-27 DIAGNOSIS — I272 Pulmonary hypertension, unspecified: Secondary | ICD-10-CM | POA: Diagnosis present

## 2023-05-27 DIAGNOSIS — Z8249 Family history of ischemic heart disease and other diseases of the circulatory system: Secondary | ICD-10-CM | POA: Diagnosis not present

## 2023-05-27 DIAGNOSIS — J9621 Acute and chronic respiratory failure with hypoxia: Secondary | ICD-10-CM | POA: Diagnosis present

## 2023-05-27 DIAGNOSIS — J441 Chronic obstructive pulmonary disease with (acute) exacerbation: Secondary | ICD-10-CM | POA: Diagnosis present

## 2023-05-27 DIAGNOSIS — S62102D Fracture of unspecified carpal bone, left wrist, subsequent encounter for fracture with routine healing: Secondary | ICD-10-CM | POA: Diagnosis not present

## 2023-05-27 DIAGNOSIS — E785 Hyperlipidemia, unspecified: Secondary | ICD-10-CM | POA: Diagnosis present

## 2023-05-27 DIAGNOSIS — Z803 Family history of malignant neoplasm of breast: Secondary | ICD-10-CM | POA: Diagnosis not present

## 2023-05-27 DIAGNOSIS — Z7984 Long term (current) use of oral hypoglycemic drugs: Secondary | ICD-10-CM | POA: Diagnosis not present

## 2023-05-27 DIAGNOSIS — Z79899 Other long term (current) drug therapy: Secondary | ICD-10-CM | POA: Diagnosis not present

## 2023-05-27 DIAGNOSIS — Z833 Family history of diabetes mellitus: Secondary | ICD-10-CM | POA: Diagnosis not present

## 2023-05-27 DIAGNOSIS — I110 Hypertensive heart disease with heart failure: Secondary | ICD-10-CM | POA: Diagnosis present

## 2023-05-27 DIAGNOSIS — Z1152 Encounter for screening for COVID-19: Secondary | ICD-10-CM | POA: Diagnosis not present

## 2023-05-27 DIAGNOSIS — R509 Fever, unspecified: Secondary | ICD-10-CM | POA: Diagnosis not present

## 2023-05-27 LAB — URINE CULTURE
Culture: <10000 — AB
Special Requests: NORMAL

## 2023-05-27 LAB — GLUCOSE, CAPILLARY
Glucose-Capillary: 135 mg/dL — ABNORMAL HIGH (ref 70–99)
Glucose-Capillary: 159 mg/dL — ABNORMAL HIGH (ref 70–99)
Glucose-Capillary: 209 mg/dL — ABNORMAL HIGH (ref 70–99)
Glucose-Capillary: 244 mg/dL — ABNORMAL HIGH (ref 70–99)
Glucose-Capillary: 168 mg/dL — ABNORMAL HIGH (ref 70–99)

## 2023-05-27 LAB — BASIC METABOLIC PANEL
Anion gap: 10 (ref 5–15)
BUN: 16 mg/dL (ref 8–23)
CO2: 23 mmol/L (ref 22–32)
Calcium: 7.8 mg/dL — ABNORMAL LOW (ref 8.9–10.3)
Chloride: 101 mmol/L (ref 98–111)
Creatinine, Ser: 0.84 mg/dL (ref 0.61–1.24)
GFR, Estimated: >60 mL/min (ref >60)
Glucose, Bld: 194 mg/dL — ABNORMAL HIGH (ref 70–99)
Potassium: 3.5 mmol/L (ref 3.5–5.1)
Sodium: 134 mmol/L — ABNORMAL LOW (ref 135–145)

## 2023-05-27 LAB — CBC
HCT: 36.9 % — ABNORMAL LOW (ref 39.0–52.0)
Hemoglobin: 12.3 g/dL — ABNORMAL LOW (ref 13.0–17.0)
MCH: 30.8 pg (ref 26.0–34.0)
MCHC: 33.3 g/dL (ref 30.0–36.0)
MCV: 92.3 fL (ref 80.0–100.0)
Platelets: 153 10*3/uL (ref 150–400)
RBC: 4 MIL/uL — ABNORMAL LOW (ref 4.22–5.81)
RDW: 14.6 % (ref 11.5–15.5)
WBC: 12 10*3/uL — ABNORMAL HIGH (ref 4.0–10.5)
nRBC: 0 % (ref 0.0–0.2)

## 2023-05-27 MED ORDER — METHYLPREDNISOLONE SODIUM SUCC 40 MG IJ SOLR
40.0000 mg | Freq: Two times a day (BID) | INTRAMUSCULAR | Status: DC
  Administered 2023-05-27 – 2023-05-28 (×3): 40 mg via INTRAVENOUS
  Filled 2023-05-27 (×3): qty 1

## 2023-05-27 MED ORDER — POTASSIUM CHLORIDE CRYS ER 20 MEQ PO TBCR
40.0000 meq | EXTENDED_RELEASE_TABLET | Freq: Once | ORAL | Status: AC
  Administered 2023-05-27: 40 meq via ORAL
  Filled 2023-05-27: qty 2

## 2023-05-27 MED ORDER — FUROSEMIDE 40 MG PO TABS
40.0000 mg | ORAL_TABLET | Freq: Two times a day (BID) | ORAL | Status: DC
  Administered 2023-05-27 – 2023-05-28 (×3): 40 mg via ORAL
  Filled 2023-05-27 (×3): qty 1

## 2023-05-27 NOTE — Plan of Care (Signed)
  Problem: Coping: Goal: Ability to adjust to condition or change in health will improve Outcome: Progressing   

## 2023-05-27 NOTE — Progress Notes (Signed)
PROGRESS NOTE        PATIENT DETAILS Name: Bradley RIDENHOUR Sr. Age: 82 y.o. Sex: male Date of Birth: October 19, 1940 Admit Date: 05/25/2023 Admitting Physician Dewayne Shorter Levora Dredge, MD PCP:Paz, Nolon Rod, MD  Brief Summary: Patient is a 82 y.o.  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  Significant events: 8/16>> admit to [facility]  Significant studies: 8/16>> CTA chest: No PE, mild bibasilar atelectasis.  Patient  Significant microbiology data: 8/15>> COVID/RSV/influenza PCR: Negative 8/15>> respiratory virus panel: Negative 8/15>> urine culture: Pending  Procedures: None  Consults: None  Subjective: Feels better but when walking to the bathroom and moving around the room-he gets short winded pretty quickly-claims that he is not yet back to his baseline.  Objective: Vitals: Blood pressure 127/82, pulse 70, temperature 97.7 F (36.5 C), temperature source Oral, resp. rate 16, height 5\' 9"  (1.753 m), weight 79.9 kg, SpO2 98%.   Exam: Gen Exam:Alert awake-not in any distress HEENT:atraumatic, normocephalic Chest: Moving air well-some scattered rhonchi. CVS:S1S2 regular Abdomen:soft non tender, non distended Extremities:no edema Neurology: Non focal Skin: no rash  Pertinent Labs/Radiology:    Latest Ref Rng & Units 05/27/2023   12:15 AM 05/26/2023   10:58 AM 05/26/2023   12:31 AM  CBC  WBC 4.0 - 10.5 K/uL 12.0  16.3  14.9   Hemoglobin 13.0 - 17.0 g/dL 16.1  09.6  04.5   Hematocrit 39.0 - 52.0 % 36.9  41.0  41.0   Platelets 150 - 400 K/uL 153  180  186     Lab Results  Component Value Date   NA 134 (L) 05/27/2023   K 3.5 05/27/2023   CL 101 05/27/2023   CO2 23 05/27/2023      Assessment/Plan: Acute on chronic hypoxic  respiratory failure (required NRB on initial presentation) secondary to COPD exacerbation Improved-but not yet back to baseline (still gets short of breath with ambulation)-suspect that he needs another day of hospitalization-unlikely will be able to go home on 8/17. Continue IV Solu-Medrol/scheduled bronchodilators and other supportive care Note had fever on presentation but suspected viral prodrome-although extensive workup negative so far.  Being monitored off antibiotics-no fever overnight. Continued attempts to titrate down to usual home regimen of 3-4 L Encourage incentive spirometry/flutter valve Mobilize with PT/OT  Chronic HFpEF Euvolemic Resume diuretic regimen  Chronic atrial fibrillation-s/p AV nodal ablation and PPM implantation Stable Suspect not on anticoagulation due to prior history of ICH  HTN BP stable Norvasc/bisoprolol  HLD Statin  DM-2 (A1c 6.3 on 8/15) CBGs stable Farxiga/SSI  Recent Labs    05/26/23 2118 05/26/23 2345 05/27/23 0720  GLUCAP 149* 230* 135*    Left wrist fracture Occurred prior to this hospitalization Claims he needs a continue his immobilizer till the end of the month-has follow-up with orthopedics already scheduled.  BPH Flomax  GERD PPI  Obesity: Estimated body mass index is 26.01 kg/m as calculated from the following:   Height as of this encounter: 5\' 9"  (1.753 m).   Weight as of this encounter: 79.9 kg.   Code status:   Code Status: Full Code   DVT Prophylaxis: enoxaparin (LOVENOX) injection 40 mg Start: 05/26/23 1000  Family Communication: None at bedside   Disposition Plan: Status is: Observation The patient will require care spanning > 2 midnights and should be moved to inpatient because: Severity of illness   Planned Discharge Destination:Home likely on 8/17-if he continues to improve and is back to baseline.   Diet: Diet Order             Diet Carb Modified Fluid consistency: Thin; Room service  appropriate? Yes  Diet effective now                     Antimicrobial agents: Anti-infectives (From admission, onward)    Start     Dose/Rate Route Frequency Ordered Stop   05/26/23 0230  cefTRIAXone (ROCEPHIN) 1 g in sodium chloride 0.9 % 100 mL IVPB        1 g 200 mL/hr over 30 Minutes Intravenous  Once 05/26/23 0227 05/26/23 0311        MEDICATIONS: Scheduled Meds:  amLODipine  10 mg Oral Daily   atorvastatin  40 mg Oral QHS   bisoprolol  10 mg Oral Daily   budesonide  0.5 mg Nebulization BID   dapagliflozin propanediol  10 mg Oral QAC breakfast   enoxaparin (LOVENOX) injection  40 mg Subcutaneous Q24H   insulin aspart  0-5 Units Subcutaneous QHS   insulin aspart  0-9 Units Subcutaneous TID WC   ipratropium-albuterol  3 mL Nebulization TID   methylPREDNISolone (SOLU-MEDROL) injection  40 mg Intravenous Q12H   pantoprazole  40 mg Oral QAC breakfast   sodium chloride flush  3 mL Intravenous Q12H   tamsulosin  0.4 mg Oral QPC supper   Continuous Infusions: PRN Meds:.acetaminophen **OR** acetaminophen, albuterol, ondansetron **OR** ondansetron (ZOFRAN) IV, zolpidem   I have personally reviewed following labs and imaging studies  LABORATORY DATA: CBC: Recent Labs  Lab 05/26/23 0031 05/26/23 1058 05/27/23 0015  WBC 14.9* 16.3* 12.0*  NEUTROABS 13.2*  --   --   HGB 13.6 13.6 12.3*  HCT 41.0 41.0 36.9*  MCV 92.8 93.8 92.3  PLT 186 180 153    Basic Metabolic Panel: Recent Labs  Lab 05/26/23 0031 05/26/23 1058 05/27/23 0015  NA 139  --  134*  K 3.9  --  3.5  CL 100  --  101  CO2 28  --  23  GLUCOSE 142*  --  194*  BUN 13  --  16  CREATININE 0.90 1.03 0.84  CALCIUM 8.8*  --  7.8*    GFR: Estimated Creatinine Clearance: 67.8 mL/min (by C-G formula based on SCr of 0.84 mg/dL).  Liver Function Tests: No results for input(s): "AST", "ALT", "ALKPHOS", "BILITOT", "PROT", "ALBUMIN" in the last 168 hours. No results for input(s): "LIPASE", "AMYLASE" in  the last 168 hours. No results for input(s): "AMMONIA" in the last 168 hours.  Coagulation Profile: No results for input(s): "INR", "PROTIME" in the last 168 hours.  Cardiac Enzymes: No results for input(s): "CKTOTAL", "CKMB", "CKMBINDEX", "TROPONINI" in the last 168 hours.  BNP (last 3 results) No results for input(s): "PROBNP" in the last 8760 hours.  Lipid Profile: No results for input(s): "CHOL", "HDL", "LDLCALC", "TRIG", "CHOLHDL", "LDLDIRECT" in the last 72 hours.  Thyroid Function Tests: No results for input(s): "TSH", "T4TOTAL", "FREET4", "T3FREE", "THYROIDAB" in the last 72 hours.  Anemia Panel: No results for input(s): "VITAMINB12", "FOLATE", "FERRITIN", "TIBC", "IRON", "RETICCTPCT" in the last 72 hours.  Urine analysis:    Component Value Date/Time   COLORURINE YELLOW 05/26/2023 0244  APPEARANCEUR CLEAR 05/26/2023 0244   LABSPEC 1.016 05/26/2023 0244   PHURINE 5.0 05/26/2023 0244   GLUCOSEU >=500 (A) 05/26/2023 0244   GLUCOSEU NEGATIVE 07/15/2015 1440   HGBUR NEGATIVE 05/26/2023 0244   BILIRUBINUR NEGATIVE 05/26/2023 0244   KETONESUR NEGATIVE 05/26/2023 0244   PROTEINUR NEGATIVE 05/26/2023 0244   UROBILINOGEN 1.0 07/15/2015 1440   NITRITE NEGATIVE 05/26/2023 0244   LEUKOCYTESUR NEGATIVE 05/26/2023 0244    Sepsis Labs: Lactic Acid, Venous    Component Value Date/Time   LATICACIDVEN 1.4 01/24/2023 1615    MICROBIOLOGY: Recent Results (from the past 240 hour(s))  Resp panel by RT-PCR (RSV, Flu A&B, Covid) Anterior Nasal Swab     Status: None   Collection Time: 05/26/23 12:35 AM   Specimen: Anterior Nasal Swab  Result Value Ref Range Status   SARS Coronavirus 2 by RT PCR NEGATIVE NEGATIVE Final   Influenza A by PCR NEGATIVE NEGATIVE Final   Influenza B by PCR NEGATIVE NEGATIVE Final    Comment: (NOTE) The Xpert Xpress SARS-CoV-2/FLU/RSV plus assay is intended as an aid in the diagnosis of influenza from Nasopharyngeal swab specimens and should not be  used as a sole basis for treatment. Nasal washings and aspirates are unacceptable for Xpert Xpress SARS-CoV-2/FLU/RSV testing.  Fact Sheet for Patients: BloggerCourse.com  Fact Sheet for Healthcare Providers: SeriousBroker.it  This test is not yet approved or cleared by the [place] FDA and has been authorized for detection and/or diagnosis of SARS-CoV-2 by FDA under an Emergency Use Authorization (EUA). This EUA will remain in effect (meaning this test can be used) for the duration of the COVID-19 declaration under Section 564(b)(1) of the Act, 21 U.S.C. section 360bbb-3(b)(1), unless the authorization is terminated or revoked.     Resp Syncytial Virus by PCR NEGATIVE NEGATIVE Final    Comment: (NOTE) Fact Sheet for Patients: BloggerCourse.com  Fact Sheet for Healthcare Providers: SeriousBroker.it  This test is not yet approved or cleared by the [place] FDA and has been authorized for detection and/or diagnosis of SARS-CoV-2 by FDA under an Emergency Use Authorization (EUA). This EUA will remain in effect (meaning this test can be used) for the duration of the COVID-19 declaration under Section 564(b)(1) of the Act, 21 U.S.C. section 360bbb-3(b)(1), unless the authorization is terminated or revoked.  Performed at [facility], [address] [place], [place] [address]   Respiratory (~20 pathogens) panel by PCR     Status: None   Collection Time: 05/26/23  5:15 AM   Specimen: Nasopharyngeal Swab; Respiratory  Result Value Ref Range Status   Adenovirus NOT DETECTED NOT DETECTED Final   Coronavirus 229E NOT DETECTED NOT DETECTED Final    Comment: (NOTE) The Coronavirus on the Respiratory Panel, DOES NOT test for the novel  Coronavirus (2019 nCoV)    Coronavirus HKU1 NOT DETECTED NOT DETECTED Final   Coronavirus NL63 NOT DETECTED NOT DETECTED Final    Coronavirus OC43 NOT DETECTED NOT DETECTED Final   Metapneumovirus NOT DETECTED NOT DETECTED Final   Rhinovirus / Enterovirus NOT DETECTED NOT DETECTED Final   Influenza A NOT DETECTED NOT DETECTED Final   Influenza B NOT DETECTED NOT DETECTED Final   Parainfluenza Virus 1 NOT DETECTED NOT DETECTED Final   Parainfluenza Virus 2 NOT DETECTED NOT DETECTED Final   Parainfluenza Virus 3 NOT DETECTED NOT DETECTED Final   Parainfluenza Virus 4 NOT DETECTED NOT DETECTED Final   Respiratory Syncytial Virus NOT DETECTED NOT DETECTED Final   Bordetella pertussis NOT  DETECTED NOT DETECTED Final   Bordetella Parapertussis NOT DETECTED NOT DETECTED Final   Chlamydophila pneumoniae NOT DETECTED NOT DETECTED Final   Mycoplasma pneumoniae NOT DETECTED NOT DETECTED Final    Comment: Performed at [facility], [address] [place], [place] [address]    RADIOLOGY STUDIES/RESULTS: CT Angio Chest PE W and/or Wo Contrast  Result Date: 05/26/2023 CLINICAL DATA:  Shortness of breath EXAM: CT ANGIOGRAPHY CHEST WITH CONTRAST TECHNIQUE: Multidetector CT imaging of the chest was performed using the standard protocol during bolus administration of intravenous contrast. Multiplanar CT image reconstructions and MIPs were obtained to evaluate the vascular anatomy. RADIATION DOSE REDUCTION: This exam was performed according to the departmental dose-optimization program which includes automated exposure control, adjustment of the mA and/or kV according to patient size and/or use of iterative reconstruction technique. CONTRAST:  75mL OMNIPAQUE IOHEXOL 350 MG/ML SOLN COMPARISON:  Chest x-ray from earlier in the same day. FINDINGS: Cardiovascular: Atherosclerotic calcifications of the thoracic aorta are noted. No aneurysmal dilatation is seen. No filling defect to suggest pulmonary embolism is noted. Coronary calcifications are seen. Pacing device is again noted. Mediastinum/Nodes: Thoracic inlet is within normal limits. No  hilar or mediastinal adenopathy is noted. The esophagus as visualized is within normal limits. Lungs/Pleura: Lungs are well aerated bilaterally. Mosaic attenuation is noted consistent with a degree of air trapping. Mild basilar atelectatic changes are noted. No sizable effusion is seen. Upper Abdomen: Visualized upper abdomen shows no acute abnormality. Simple renal cyst is noted on the left stable from prior exam. No further follow-up is recommended. Musculoskeletal: Degenerative changes of the thoracic spine are noted. No rib abnormality is noted. Review of the MIP images confirms the above findings. IMPRESSION: No evidence of pulmonary embolism. Mild basilar atelectasis. Aortic aneurysm NOS (ICD10-I71.9). Electronically Signed   By: Alcide Clever M.D.   On: 05/26/2023 02:02   DG Chest Portable 1 View  Result Date: 05/26/2023 CLINICAL DATA:  Shortness of breath. EXAM: PORTABLE CHEST 1 VIEW COMPARISON:  10/01/2022. FINDINGS: The heart is enlarged and the mediastinal contour is within normal limits. Atherosclerotic calcification of the aorta is noted. The pulmonary vasculature is distended. Interstitial prominence is present bilaterally and there is mild airspace disease at the left lung base. There are small bilateral pleural effusions. No pneumothorax. A dual lead pacemaker is present over the left chest. No acute osseous abnormality. IMPRESSION: 1. Cardiomegaly with pulmonary vascular congestion. 2. Interstitial prominence bilaterally with patchy airspace disease at the left lung base, likely edema. 3. Small bilateral pleural effusions. Electronically Signed   By: Thornell Sartorius M.D.   On: 05/26/2023 00:24     LOS: 0 days   Jeoffrey Massed, MD  [facility]    To contact the attending provider between 7A-7P or the covering provider during after hours 7P-7A, please log into the web site [email or web address] and access using universal [place] password for that web site. If you do not have the  password, please call the hospital operator.  05/27/2023, 9:00 AM

## 2023-05-27 NOTE — Evaluation (Signed)
Occupational Therapy Evaluation Patient Details Name: Bradley [name] Sr. MRN: [id number] DOB: August 18, 1941 Today's Date: 05/27/2023   History of Present Illness 82 y.o. male presents to [facility] hospital on 05/25/2023 with SOB. Pt found to be hypoxic and with fever. PMH includes afib s/p PPM, DM, HLD, HTN, chronic diastolic HF, COPD, PAH, CAD.   Clinical Impression   Patient lives  at home with wife, granddaughter and great grandchild in 3 level town home in which he stays on 2nd level.  Patient reporting that he is Independent with ADLs and IADLs and occasionally when needed will use RW for functional mobility.  Patient currently presents being Independent in functional mobility, toileting transfers and LB ADLs. No further OT intervention needed at this time.      If plan is discharge home, recommend the following:      Functional Status Assessment  Patient has had a recent decline in their functional status and demonstrates the ability to make significant improvements in function in a reasonable and predictable amount of time.  Equipment Recommendations  None recommended by OT    Recommendations for Other Services       Precautions / Restrictions Precautions Precautions: Fall Restrictions Weight Bearing Restrictions: Yes LUE Weight Bearing: Weight bear through elbow only Other Position/Activity Restrictions: presumed 2/2 wrist fx      Mobility Bed Mobility Overal bed mobility: Independent                  Transfers Overall transfer level: Independent Equipment used: None                      Balance Overall balance assessment: Independent                                         ADL either performed or assessed with clinical judgement   ADL Overall ADL's : Independent                                             Vision Baseline Vision/History: 1 Wears glasses Patient Visual Report: No change from baseline Vision  Assessment?: No apparent visual deficits     Perception         Praxis         Pertinent Vitals/Pain Pain Assessment Pain Assessment: No/denies pain     Extremity/Trunk Assessment Upper Extremity Assessment Upper Extremity Assessment: Overall WFL for tasks assessed   Lower Extremity Assessment Lower Extremity Assessment: Overall WFL for tasks assessed   Cervical / Trunk Assessment Cervical / Trunk Assessment: Normal   Communication Communication Communication: No apparent difficulties Cueing Techniques: Verbal cues   Cognition Arousal: Alert Behavior During Therapy: WFL for tasks assessed/performed Overall Cognitive Status: Within Functional Limits for tasks assessed                                       General Comments  VSS on 3L [place], sats 91% and above    Exercises     Shoulder Instructions      Home Living Family/patient expects to be discharged to:: Private residence Living Arrangements: Spouse/significant other;Other relatives Available Help at Discharge: Family;Available PRN/intermittently Type of Home: House  Home Access: Stairs to enter Entrance Stairs-Number of Steps: 1+1   Home Layout: Multi-level Alternate Level Stairs-Number of Steps: stair lift available to 2nd floor Alternate Level Stairs-Rails: Right Bathroom Shower/Tub: Producer, television/film/video: Standard Bathroom Accessibility: No   Home Equipment: Cane - single point;Shower seat - built in;Grab bars - tub/shower          Prior Functioning/Environment Prior Level of Function : Independent/Modified Independent             Mobility Comments: no AD          OT Problem List:        OT Treatment/Interventions:      OT Goals(Current goals can be found in the care plan section) Acute Rehab OT Goals OT Goal Formulation: With patient Time For Goal Achievement: 06/03/23 Potential to Achieve Goals: Good  OT Frequency:      Co-evaluation               AM-PAC OT "6 Clicks" Daily Activity     Outcome Measure Help from another person eating meals?: None Help from another person taking care of personal grooming?: None Help from another person toileting, which includes using toliet, bedpan, or urinal?: None Help from another person bathing (including washing, rinsing, drying)?: None Help from another person to put on and taking off regular upper body clothing?: None Help from another person to put on and taking off regular lower body clothing?: None 6 Click Score: 24   End of Session Nurse Communication: Mobility status  Activity Tolerance: Patient tolerated treatment well Patient left: in bed;with call bell/phone within reach  OT Visit Diagnosis: Muscle weakness (generalized) (M62.81)                Time: 0981-1914 OT Time Calculation (min): 15 min Charges:  OT General Charges $OT Visit: 1 Visit OT Evaluation $OT Eval Low Complexity: 1 Low  Governor Specking OT/L  Denice Paradise 05/27/2023, 12:05 PM

## 2023-05-27 NOTE — Plan of Care (Signed)
Problem: Education: Goal: Knowledge of General Education information will improve Description: Including pain rating scale, medication(s)/side effects and non-pharmacologic comfort measures Outcome: Progressing Pt understands he was admitted into the hospital for worsening SOB, fever of 102.36F, COPD exacerbation r/t viral PNA.   Problem: Clinical Measurements: Goal: Ability to maintain clinical measurements within normal limits will improve Outcome: Progressing Pt's VS WNL.    Problem: Clinical Measurements: Goal: Will remain free from infection Outcome: Progressing S/Sx of infection monitored and assessed q8 hours.  Pt has remained afebrile thus far.       Problem: Clinical Measurements: Goal: Respiratory complications will improve Outcome: Progressing Respiratory status monitored and assessed q8 hours.  Pt is on 2L of O2 with PO2 saturations at 92-98% and respirations of 16-18 breaths per minute.  He has endorse c/o SOB and DOE.      Problem: Activity: Goal: Risk for activity intolerance will decrease Outcome: Progressing Pt is a moderate assist of all his ADLs. He needs the assistance of RN staff to get OOB.   Problem: Nutrition: Goal: Adequate nutrition will be maintained Outcome: Progressing Pt is on a carb modified diet per MD's orders.  He has been able to tolerate his diet without s/sx of n/v or abdominal pain/ distention.     Problem: Elimination: Goal: Will not experience complications related to bowel motility Outcome: Progressing Pt LBM was in 05/27/2023.  He has not endorse c/o constipation.    Problem: Elimination: Goal: Will not experience complications related to urinary retention Outcome: Progressing Pt has denied c/o dysuria or abdominal distention/ pain.   Problem: Pain Management: Goal: General experience of comfort will improve Outcome: Progressing Pt has endorsed c/o 6-7/10 left arm pain describing it as a constant dull ache.  Reiterated pain scale so  he could adequately rate his pain.  Pt stated his pain goal this admission 0/10.  Discussed nonpharmacological methods to help reduce s/sx of pain.  Interventions given per pt's request and MD's orders.    Problem: Safety: Goal: Ability to remain free from injury will improve Outcome: Progressing Pt has remained free from falls thus far.  Instructed pt to utilize RN call light for assistance.  Hourly rounds performed.  Bed alarm implemented to keep pt safe from falls.  Settings activated to third most sensitive mode.  Bed in lowest position, locked with two upper side rails engaged.  Belongings and call light within reach.    Problem: Skin Integrity: Goal: Risk for impaired skin integrity will decrease Outcome: Progressing Skin integrity monitored and assessed q-shift. Pt is on q2 hourly turns to prevent further skin impairment.  Tubes and drains assessed for device related pressure sores.  Pt is continent of both bowel and bladder.

## 2023-05-27 NOTE — Evaluation (Signed)
Physical Therapy Evaluation Patient Details Name: Bradley GRAMZA Sr. MRN: [id number] DOB: Sep 14, 1941 Today's Date: 05/27/2023  History of Present Illness  82 y.o. male presents to [facility] hospital on 05/25/2023 with SOB. Pt found to be hypoxic and with fever. PMH includes afib s/p PPM, DM, HLD, HTN, chronic diastolic HF, COPD, PAH, CAD.  Clinical Impression  Pt presents to PT with no significant deficits compare to baseline. Pt is able to ambulate independently within the room, denies SOB or DOE, and maintains sats on 3L [place]. Pt expresses no concerns about mobility quality at this time. Pt is encouraged to mobilize frequently until discharge is complete.  Pt has no further acute PT needs. PT signing off.      If plan is discharge home, recommend the following: Assistance with cooking/housework   Can travel by private vehicle        Equipment Recommendations None recommended by PT  Recommendations for Other Services       Functional Status Assessment Patient has not had a recent decline in their functional status     Precautions / Restrictions Precautions Precautions: Fall Restrictions Weight Bearing Restrictions: Yes LUE Weight Bearing: Weight bear through elbow only Other Position/Activity Restrictions: presumed 2/2 wrist fx      Mobility  Bed Mobility Overal bed mobility: Independent                  Transfers Overall transfer level: Independent                      Ambulation/Gait Ambulation/Gait assistance: Modified independent (Device/Increase time) Gait Distance (Feet): 200 Feet Assistive device: None Gait Pattern/deviations: Step-through pattern Gait velocity: functional Gait velocity interpretation: 1.31 - 2.62 ft/sec, indicative of limited community ambulator   General Gait Details: step-through gait with reduced stride length  Stairs            Wheelchair Mobility     Tilt Bed    Modified Rankin (Stroke Patients Only)        Balance Overall balance assessment: No apparent balance deficits (not formally assessed)                                           Pertinent Vitals/Pain Pain Assessment Pain Assessment: No/denies pain    Home Living Family/patient expects to be discharged to:: Private residence Living Arrangements: Spouse/significant other;Other relatives Available Help at Discharge: Family;Available PRN/intermittently Type of Home: House Home Access: Stairs to enter   Entrance Stairs-Number of Steps: 1+1 Alternate Level Stairs-Number of Steps: stair lift available to 2nd floor Home Layout: Multi-level Home Equipment: Cane - single point;Shower seat - built in;Grab bars - tub/shower      Prior Function Prior Level of Function : Independent/Modified Independent             Mobility Comments: no AD       Extremity/Trunk Assessment   Upper Extremity Assessment Upper Extremity Assessment: Overall WFL for tasks assessed    Lower Extremity Assessment Lower Extremity Assessment: Overall WFL for tasks assessed    Cervical / Trunk Assessment Cervical / Trunk Assessment: Normal  Communication   Communication Communication: No apparent difficulties Cueing Techniques: Verbal cues  Cognition Arousal: Alert Behavior During Therapy: WFL for tasks assessed/performed Overall Cognitive Status: Within Functional Limits for tasks assessed  General Comments General comments (skin integrity, edema, etc.): VSS on 3L [place], sats 91% and above    Exercises     Assessment/Plan    PT Assessment Patient does not need any further PT services  PT Problem List         PT Treatment Interventions      PT Goals (Current goals can be found in the Care Plan section)       Frequency       Co-evaluation               AM-PAC PT "6 Clicks" Mobility  Outcome Measure Help needed turning from your back to your side  while in a flat bed without using bedrails?: None Help needed moving from lying on your back to sitting on the side of a flat bed without using bedrails?: None Help needed moving to and from a bed to a chair (including a wheelchair)?: None Help needed standing up from a chair using your arms (e.g., wheelchair or bedside chair)?: None Help needed to walk in hospital room?: None Help needed climbing 3-5 steps with a railing? : None 6 Click Score: 24    End of Session   Activity Tolerance: Patient tolerated treatment well Patient left: in bed;with call bell/phone within reach Nurse Communication: Mobility status PT Visit Diagnosis: Other abnormalities of gait and mobility (R26.89)    Time: 0865-7846 PT Time Calculation (min) (ACUTE ONLY): 19 min   Charges:   PT Evaluation $PT Eval Low Complexity: 1 Low   PT General Charges $$ ACUTE PT VISIT: 1 Visit         Arlyss Gandy, PT, DPT Acute Rehabilitation Office [phone]   Arlyss Gandy 05/27/2023, 8:34 AM

## 2023-05-28 DIAGNOSIS — I712 Thoracic aortic aneurysm, without rupture, unspecified: Secondary | ICD-10-CM | POA: Diagnosis not present

## 2023-05-28 DIAGNOSIS — J129 Viral pneumonia, unspecified: Secondary | ICD-10-CM | POA: Diagnosis not present

## 2023-05-28 DIAGNOSIS — J441 Chronic obstructive pulmonary disease with (acute) exacerbation: Secondary | ICD-10-CM | POA: Diagnosis not present

## 2023-05-28 LAB — GLUCOSE, CAPILLARY
Glucose-Capillary: 173 mg/dL — ABNORMAL HIGH (ref 70–99)
Glucose-Capillary: 172 mg/dL — ABNORMAL HIGH (ref 70–99)

## 2023-05-28 MED ORDER — LORATADINE 10 MG PO TABS: 10.0000 mg | ORAL_TABLET | Freq: Every day | ORAL | Status: DC

## 2023-05-28 MED ORDER — FLUTICASONE PROPIONATE 50 MCG/ACT NA SUSP
1.0000 | Freq: Every day | NASAL | Status: DC
  Filled 2023-05-28: qty 16

## 2023-05-28 MED ORDER — PREDNISONE 10 MG PO TABS: ORAL_TABLET | ORAL | 0 refills | Status: DC

## 2023-05-28 MED ORDER — FLUTICASONE PROPIONATE 50 MCG/ACT NA SUSP: 1.0000 | Freq: Every day | NASAL | 0 refills | Status: DC

## 2023-05-28 MED ORDER — LORATADINE 10 MG PO TABS: 10.0000 mg | ORAL_TABLET | Freq: Every day | ORAL | 0 refills | Status: DC

## 2023-05-28 NOTE — Progress Notes (Signed)
Pt to be d/c home to self care. Reviewed AVS with pt, changes to his medication list and made him aware of f/u appointments with his PCP. Informed pt that he had pending prescriptions to be picked up from his preferred pharmacy.  Answered any pending questions. Pt had no further questions. Removed PIV which was CDI and free from s/sx of infection upon removal. Helped pt in gathering his belongings and getting dressed. Assisted pt into wheelchair and he was escorted off the unit accompanied by Viri, NT to the front of the hospital where his ride awaits to take him home. Pt d/c in stable condition.

## 2023-05-28 NOTE — Discharge Summary (Signed)
PATIENT DETAILS Name: Bradley Searles Sr. Age: 82 y.o. Sex: male Date of Birth: 19-Jun-1941 MRN: [id number]. Admitting Physician: Maretta Bees, MD PCP:Paz, Nolon Rod, MD  Admit Date: 05/25/2023 Discharge date: 05/28/2023  Recommendations for Outpatient Follow-up:  Follow up with PCP in 1-2 weeks Please obtain CMP/CBC in one week  Admitted From:  Home  Disposition: Home   Discharge Condition: good  CODE STATUS:   Code Status: Full Code   Diet recommendation:  Diet Order             Diet - low sodium heart healthy           Diet Carb Modified Fluid consistency: Thin; Room service appropriate? Yes  Diet effective now                    Brief Summary: Patient is a 82 y.o.  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   Significant events: 8/16>> admit to [facility]   Significant studies: 8/16>> CTA chest: No PE, mild bibasilar atelectasis.  Patient   Significant microbiology data: 8/15>> COVID/RSV/influenza PCR: Negative 8/15>> respiratory virus panel: Negative 8/15>> urine culture: <10,000 colonies/mL-insignificant growth   Procedures: None   Consults: None   Brief Hospital Course: Acute on chronic hypoxic respiratory failure (required NRB on initial presentation) secondary to COPD exacerbation Significantly improved after IV steroids/bronchodilators and other supportive care-he feels much better-and close to baseline-his lungs are completely clear on exam this morning without any rhonchi He did have fever on initial presentation but has not had any fever since then.  Workup negative so far-does not appear acutely sick-and anxious to go home today. Continue tapering steroids-continue usual inhaler regimen-and resume his usual home oxygen  regimen of 3-4 L. Note-he did not have any major issues with ambulation when he worked with physical therapy yesterday.   Chronic HFpEF Euvolemic Resume diuretic regimen   Chronic atrial fibrillation-s/p AV nodal ablation and PPM implantation Stable Suspect not on anticoagulation due to prior history of ICH   HTN BP stable Norvasc/bisoprolol   HLD Statin   DM-2 (A1c 6.3 on 8/15) CBGs stable Farxiga/SSI  BMI: Estimated body mass index is 26.01 kg/m as calculated from the following:   Height as of this encounter: 5\' 9"  (1.753 m).   Weight as of this encounter: 79.9 kg.     Discharge Diagnoses:  Principal Problem:   Viral pneumonia Active Problems:   COPD exacerbation [facility])   Discharge Instructions:  Activity:  As tolerated with Full fall precautions use walker/cane & assistance as needed   Discharge Instructions     Call MD for:  difficulty breathing, headache or visual disturbances   Complete by: As directed    Call MD for:  extreme fatigue   Complete by: As directed    Call MD for:  persistant dizziness or light-headedness   Complete by: As directed    Diet - low sodium heart healthy   Complete by: As directed    Discharge instructions   Complete by: As directed    Follow with Primary MD  [name], MD in 1-2 weeks  Please get a complete blood count and chemistry panel checked by your Primary MD at your next visit, and again as instructed by your Primary MD.  Get Medicines reviewed  and adjusted: Please take all your medications with you for your next visit with your Primary MD  Laboratory/radiological data: Please request your Primary MD to go over all hospital tests and procedure/radiological results at the follow up, please ask your Primary MD to get all Hospital records sent to his/her office.  In some cases, they will be blood work, cultures and biopsy results pending at the time of your discharge. Please request that your primary care M.D.  follows up on these results.  Also Note the following: If you experience worsening of your admission symptoms, develop shortness of breath, life threatening emergency, suicidal or homicidal thoughts you must seek medical attention immediately by calling 911 or calling your MD immediately  if symptoms less severe.  You must read complete instructions/literature along with all the possible adverse reactions/side effects for all the Medicines you take and that have been prescribed to you. Take any new Medicines after you have completely understood and accpet all the possible adverse reactions/side effects.   Do not drive when taking Pain medications or sleeping medications (Benzodaizepines)  Do not take more than prescribed Pain, Sleep and Anxiety Medications. It is not advisable to combine anxiety,sleep and pain medications without talking with your primary care practitioner  Special Instructions: If you have smoked or chewed Tobacco  in the last 2 yrs please stop smoking, stop any regular Alcohol  and or any Recreational drug use.  Wear Seat belts while driving.  Please note: You were cared for by a hospitalist during your hospital stay. Once you are discharged, your primary care physician will handle any further medical issues. Please note that NO REFILLS for any discharge medications will be authorized once you are discharged, as it is imperative that you return to your primary care physician (or establish a relationship with a primary care physician if you do not have one) for your post hospital discharge needs so that they can reassess your need for medications and monitor your lab values.   Increase activity slowly   Complete by: As directed       Allergies as of 05/28/2023       Reactions   Hydrocodone Other (See Comments)   "given to him in the hospital; went thru withdrawals once home; dr said not to take it again" (09/27/2012) dizziness   Ultram [tramadol] Other (See Comments)    "given to him in the hospital; went thru withdrawals once home; dr said not to take it again" (09/27/2012) dizziness   Cialis [tadalafil] Other (See Comments)   Headache Backache  Dizziness    Avelox [moxifloxacin Hydrochloride] Itching, Other (See Comments)   Headache Dizziness        Medication List     TAKE these medications    acetaminophen 500 MG tablet Commonly known as: TYLENOL Take 1,000 mg by mouth every 8 (eight) hours as needed for headache or moderate pain.   albuterol 108 (90 Base) MCG/ACT inhaler Commonly known as: VENTOLIN HFA Inhale 2 puffs into the lungs every 6 (six) hours as needed for wheezing or shortness of breath.   amLODipine 10 MG tablet Commonly known as: NORVASC TAKE 1 TABLET DAILY   aspirin 81 MG tablet Take 81 mg by mouth every morning.   atorvastatin 40 MG tablet Commonly known as: LIPITOR Take 1 tablet (40 mg total) by mouth at bedtime.   bisoprolol 10 MG tablet Commonly known as: ZEBETA TAKE 1 TABLET DAILY   budesonide 0.5 MG/2ML nebulizer solution Commonly known as: PULMICORT  Take 2 mLs (0.5 mg total) by nebulization 2 (two) times daily.   calcium-vitamin D 500-200 MG-UNIT tablet Commonly known as: OSCAL WITH D Take 1 tablet by mouth every morning.   cholecalciferol 25 MCG (1000 UNIT) tablet Commonly known as: VITAMIN D3 Take 1,000 Units by mouth every evening.   dapagliflozin propanediol 10 MG Tabs tablet Commonly known as: Farxiga Take 1 tablet (10 mg total) by mouth daily before breakfast.   diphenhydramine-acetaminophen 25-500 MG Tabs tablet Commonly known as: TYLENOL PM Take 1 tablet by mouth at bedtime as needed (sleep).   fluticasone 50 MCG/ACT nasal spray Commonly known as: FLONASE Place 1 spray into both nostrils daily for 7 days.   furosemide 40 MG tablet Commonly known as: LASIX TAKE 2 TABLETS EVERY MORNING AND 1 TABLET LATE IN THE AFTERNOON   guaiFENesin 600 MG 12 hr tablet Commonly known as:  MUCINEX Take 600 mg by mouth 2 (two) times daily.   ipratropium-albuterol 0.5-2.5 (3) MG/3ML Soln Commonly known as: DUONEB Take 3 mLs by nebulization every 6 (six) hours as needed. What changed: when to take this   loratadine 10 MG tablet Commonly known as: CLARITIN Take 1 tablet (10 mg total) by mouth daily for 7 days.   multivitamins ther. w/minerals Tabs tablet Take 1 tablet by mouth daily.   nitroGLYCERIN 0.4 MG SL tablet Commonly known as: NITROSTAT Place 1 tablet (0.4 mg total) under the tongue every 5 (five) minutes x 3 doses as needed for chest pain.   onetouch ultrasoft lancets Check blood sugars no more than twice daily   OneTouch Verio test strip Generic drug: glucose blood USE TO CHECK BLOOD SUGAR NO MORE THAN TWICE A DAY   OXYGEN Inhale 4 L/min into the lungs continuous.   pantoprazole 40 MG tablet Commonly known as: PROTONIX TAKE 1 TABLET DAILY BEFORE BREAKFAST   predniSONE 10 MG tablet Commonly known as: DELTASONE Take 40 mg daily for 1 day, 30 mg daily for 1 day, 20 mg daily for 1 days,10 mg daily for 1 day, then stop   tamsulosin 0.4 MG Caps capsule Commonly known as: FLOMAX Take 1 capsule (0.4 mg total) by mouth daily after supper.   zolpidem 10 MG tablet Commonly known as: AMBIEN Take 10 mg by mouth at bedtime as needed for sleep.        Follow-up Information     [name], MD. Schedule an appointment as soon as possible for a visit in 1 week(s).   Specialty: Internal Medicine Contact information: [address] [place] [address] [phone]                Allergies  Allergen Reactions   Hydrocodone Other (See Comments)    "given to him in the hospital; went thru withdrawals once home; dr said not to take it again" (09/27/2012) dizziness   Ultram [Tramadol] Other (See Comments)    "given to him in the hospital; went thru withdrawals once home; dr said not to take it again" (09/27/2012) dizziness    Cialis [Tadalafil] Other (See Comments)    Headache Backache  Dizziness    Avelox [Moxifloxacin Hydrochloride] Itching and Other (See Comments)    Headache Dizziness     Other Procedures/Studies: CT Angio Chest PE W and/or Wo Contrast  Result Date: 05/26/2023 CLINICAL DATA:  Shortness of breath EXAM: CT ANGIOGRAPHY CHEST WITH CONTRAST TECHNIQUE: Multidetector CT imaging of the chest was performed using the standard protocol during bolus administration of  intravenous contrast. Multiplanar CT image reconstructions and MIPs were obtained to evaluate the vascular anatomy. RADIATION DOSE REDUCTION: This exam was performed according to the departmental dose-optimization program which includes automated exposure control, adjustment of the mA and/or kV according to patient size and/or use of iterative reconstruction technique. CONTRAST:  75mL OMNIPAQUE IOHEXOL 350 MG/ML SOLN COMPARISON:  Chest x-ray from earlier in the same day. FINDINGS: Cardiovascular: Atherosclerotic calcifications of the thoracic aorta are noted. No aneurysmal dilatation is seen. No filling defect to suggest pulmonary embolism is noted. Coronary calcifications are seen. Pacing device is again noted. Mediastinum/Nodes: Thoracic inlet is within normal limits. No hilar or mediastinal adenopathy is noted. The esophagus as visualized is within normal limits. Lungs/Pleura: Lungs are well aerated bilaterally. Mosaic attenuation is noted consistent with a degree of air trapping. Mild basilar atelectatic changes are noted. No sizable effusion is seen. Upper Abdomen: Visualized upper abdomen shows no acute abnormality. Simple renal cyst is noted on the left stable from prior exam. No further follow-up is recommended. Musculoskeletal: Degenerative changes of the thoracic spine are noted. No rib abnormality is noted. Review of the MIP images confirms the above findings. IMPRESSION: No evidence of pulmonary embolism. Mild basilar atelectasis. Aortic  aneurysm NOS (ICD10-I71.9). Electronically Signed   By: Alcide Clever M.D.   On: 05/26/2023 02:02   DG Chest Portable 1 View  Result Date: 05/26/2023 CLINICAL DATA:  Shortness of breath. EXAM: PORTABLE CHEST 1 VIEW COMPARISON:  10/01/2022. FINDINGS: The heart is enlarged and the mediastinal contour is within normal limits. Atherosclerotic calcification of the aorta is noted. The pulmonary vasculature is distended. Interstitial prominence is present bilaterally and there is mild airspace disease at the left lung base. There are small bilateral pleural effusions. No pneumothorax. A dual lead pacemaker is present over the left chest. No acute osseous abnormality. IMPRESSION: 1. Cardiomegaly with pulmonary vascular congestion. 2. Interstitial prominence bilaterally with patchy airspace disease at the left lung base, likely edema. 3. Small bilateral pleural effusions. Electronically Signed   By: Thornell Sartorius M.D.   On: 05/26/2023 00:24   DG Wrist Complete Left  Result Date: 05/14/2023 CLINICAL DATA:  Wrist pain due to fall yesterday. Initial encounter. EXAM: LEFT WRIST - COMPLETE 3 VIEW COMPARISON:  None Available. FINDINGS: Transverse fracture through the distal radius with mild dorsal impaction. The fracture is extra-articular. Remote and healed third metacarpal shaft fracture. Subjective osteopenia. IMPRESSION: Distal radius fracture with mild dorsal impaction. Electronically Signed   By: Tiburcio Pea M.D.   On: 05/14/2023 05:06     TODAY-DAY OF DISCHARGE:  Subjective:   Loreen Freud today has no headache,no chest abdominal pain,no new weakness tingling or numbness, feels much better wants to go home today.   Objective:   Blood pressure 129/74, pulse 71, temperature 97.8 F (36.6 C), temperature source Oral, resp. rate 16, height 5\' 9"  (1.753 m), weight 79.9 kg, SpO2 97%.  Intake/Output Summary (Last 24 hours) at 05/28/2023 0907 Last data filed at 05/28/2023 0848 Gross per 24 hour  Intake 580  ml  Output 2900 ml  Net -2320 ml   Filed Weights   05/26/23 2222  Weight: 79.9 kg    Exam: Awake Alert, Oriented *3, No new F.N deficits, Normal affect [place].AT,PERRAL Supple Neck,No JVD, No cervical lymphadenopathy appriciated.  Symmetrical Chest wall movement, Good air movement bilaterally, CTAB RRR,No Gallops,Rubs or new Murmurs, No Parasternal Heave +ve B.Sounds, Abd Soft, Non tender, No organomegaly appriciated, No rebound -guarding or rigidity. No Cyanosis, Clubbing or  edema, No new Rash or bruise   PERTINENT RADIOLOGIC STUDIES: No results found.   PERTINENT LAB RESULTS: CBC: Recent Labs    05/26/23 1058 05/27/23 0015  WBC 16.3* 12.0*  HGB 13.6 12.3*  HCT 41.0 36.9*  PLT 180 153   CMET CMP     Component Value Date/Time   NA 134 (L) 05/27/2023 0015   NA 143 11/15/2022 0928   K 3.5 05/27/2023 0015   CL 101 05/27/2023 0015   CO2 23 05/27/2023 0015   GLUCOSE 194 (H) 05/27/2023 0015   GLUCOSE 118 (H) 10/18/2006 0000   BUN 16 05/27/2023 0015   BUN 13 11/15/2022 0928   CREATININE 0.84 05/27/2023 0015   CREATININE 0.93 07/28/2020 1026   CALCIUM 7.8 (L) 05/27/2023 0015   PROT 6.9 01/24/2023 1615   PROT 6.6 11/15/2022 0931   ALBUMIN 3.6 01/24/2023 1615   ALBUMIN 4.2 11/15/2022 0931   AST 18 01/24/2023 1615   ALT 12 01/24/2023 1615   ALKPHOS 78 01/24/2023 1615   BILITOT 0.8 01/24/2023 1615   BILITOT 0.6 11/15/2022 0931   GFR 81.75 09/06/2022 0836   EGFR 89 11/15/2022 0928   GFRNONAA >60 05/27/2023 0015    GFR Estimated Creatinine Clearance: 67.8 mL/min (by C-G formula based on SCr of 0.84 mg/dL). No results for input(s): "LIPASE", "AMYLASE" in the last 72 hours. No results for input(s): "CKTOTAL", "CKMB", "CKMBINDEX", "TROPONINI" in the last 72 hours. Invalid input(s): "POCBNP" No results for input(s): "DDIMER" in the last 72 hours. Recent Labs    05/26/23 1058  HGBA1C 6.3*   No results for input(s): "CHOL", "HDL", "LDLCALC", "TRIG", "CHOLHDL",  "LDLDIRECT" in the last 72 hours. No results for input(s): "TSH", "T4TOTAL", "T3FREE", "THYROIDAB" in the last 72 hours.  Invalid input(s): "FREET3" No results for input(s): "VITAMINB12", "FOLATE", "FERRITIN", "TIBC", "IRON", "RETICCTPCT" in the last 72 hours. Coags: No results for input(s): "INR" in the last 72 hours.  Invalid input(s): "PT" Microbiology: Recent Results (from the past 240 hour(s))  Resp panel by RT-PCR (RSV, Flu A&B, Covid) Anterior Nasal Swab     Status: None   Collection Time: 05/26/23 12:35 AM   Specimen: Anterior Nasal Swab  Result Value Ref Range Status   SARS Coronavirus 2 by RT PCR NEGATIVE NEGATIVE Final   Influenza A by PCR NEGATIVE NEGATIVE Final   Influenza B by PCR NEGATIVE NEGATIVE Final    Comment: (NOTE) The Xpert Xpress SARS-CoV-2/FLU/RSV plus assay is intended as an aid in the diagnosis of influenza from Nasopharyngeal swab specimens and should not be used as a sole basis for treatment. Nasal washings and aspirates are unacceptable for Xpert Xpress SARS-CoV-2/FLU/RSV testing.  Fact Sheet for Patients: BloggerCourse.com  Fact Sheet for Healthcare Providers: SeriousBroker.it  This test is not yet approved or cleared by the [place] FDA and has been authorized for detection and/or diagnosis of SARS-CoV-2 by FDA under an Emergency Use Authorization (EUA). This EUA will remain in effect (meaning this test can be used) for the duration of the COVID-19 declaration under Section 564(b)(1) of the Act, 21 U.S.C. section 360bbb-3(b)(1), unless the authorization is terminated or revoked.     Resp Syncytial Virus by PCR NEGATIVE NEGATIVE Final    Comment: (NOTE) Fact Sheet for Patients: BloggerCourse.com  Fact Sheet for Healthcare Providers: SeriousBroker.it  This test is not yet approved or cleared by the [place] FDA and has been  authorized for detection and/or diagnosis of SARS-CoV-2 by FDA under an Emergency Use Authorization (EUA). This EUA  will remain in effect (meaning this test can be used) for the duration of the COVID-19 declaration under Section 564(b)(1) of the Act, 21 U.S.C. section 360bbb-3(b)(1), unless the authorization is terminated or revoked.  Performed at [facility], [address] [place], [place] [address]   Urine Culture     Status: Abnormal   Collection Time: 05/26/23  2:44 AM   Specimen: Urine, Clean Catch  Result Value Ref Range Status   Specimen Description URINE, CLEAN CATCH  Final   Special Requests Normal  Final   Culture (A)  Final    <10,000 COLONIES/mL INSIGNIFICANT GROWTH Performed at [facility], [address], [place], [place] [address]    Report Status 05/27/2023 FINAL  Final  Respiratory (~20 pathogens) panel by PCR     Status: None   Collection Time: 05/26/23  5:15 AM   Specimen: Nasopharyngeal Swab; Respiratory  Result Value Ref Range Status   Adenovirus NOT DETECTED NOT DETECTED Final   Coronavirus 229E NOT DETECTED NOT DETECTED Final    Comment: (NOTE) The Coronavirus on the Respiratory Panel, DOES NOT test for the novel  Coronavirus (2019 nCoV)    Coronavirus HKU1 NOT DETECTED NOT DETECTED Final   Coronavirus NL63 NOT DETECTED NOT DETECTED Final   Coronavirus OC43 NOT DETECTED NOT DETECTED Final   Metapneumovirus NOT DETECTED NOT DETECTED Final   Rhinovirus / Enterovirus NOT DETECTED NOT DETECTED Final   Influenza A NOT DETECTED NOT DETECTED Final   Influenza B NOT DETECTED NOT DETECTED Final   Parainfluenza Virus 1 NOT DETECTED NOT DETECTED Final   Parainfluenza Virus 2 NOT DETECTED NOT DETECTED Final   Parainfluenza Virus 3 NOT DETECTED NOT DETECTED Final   Parainfluenza Virus 4 NOT DETECTED NOT DETECTED Final   Respiratory Syncytial Virus NOT DETECTED NOT DETECTED Final   Bordetella pertussis NOT DETECTED NOT DETECTED Final   Bordetella  Parapertussis NOT DETECTED NOT DETECTED Final   Chlamydophila pneumoniae NOT DETECTED NOT DETECTED Final   Mycoplasma pneumoniae NOT DETECTED NOT DETECTED Final    Comment: Performed at [facility], [address] [place], [place] [address]    FURTHER DISCHARGE INSTRUCTIONS:  Get Medicines reviewed and adjusted: Please take all your medications with you for your next visit with your Primary MD  Laboratory/radiological data: Please request your Primary MD to go over all hospital tests and procedure/radiological results at the follow up, please ask your Primary MD to get all Hospital records sent to his/her office.  In some cases, they will be blood work, cultures and biopsy results pending at the time of your discharge. Please request that your primary care M.D. goes through all the records of your hospital data and follows up on these results.  Also Note the following: If you experience worsening of your admission symptoms, develop shortness of breath, life threatening emergency, suicidal or homicidal thoughts you must seek medical attention immediately by calling 911 or calling your MD immediately  if symptoms less severe.  You must read complete instructions/literature along with all the possible adverse reactions/side effects for all the Medicines you take and that have been prescribed to you. Take any new Medicines after you have completely understood and accpet all the possible adverse reactions/side effects.   Do not drive when taking Pain medications or sleeping medications (Benzodaizepines)  Do not take more than prescribed Pain, Sleep and Anxiety Medications. It is not advisable to combine anxiety,sleep and pain medications without talking with your primary care  practitioner  Special Instructions: If you have smoked or chewed Tobacco  in the last 2 yrs please stop smoking, stop any regular Alcohol  and or any Recreational drug use.  Wear Seat belts while driving.  Please  note: You were cared for by a hospitalist during your hospital stay. Once you are discharged, your primary care physician will handle any further medical issues. Please note that NO REFILLS for any discharge medications will be authorized once you are discharged, as it is imperative that you return to your primary care physician (or establish a relationship with a primary care physician if you do not have one) for your post hospital discharge needs so that they can reassess your need for medications and monitor your lab values.  Total Time spent coordinating discharge including counseling, education and face to face time equals greater than 30 minutes.  SignedJeoffrey Massed 05/28/2023 9:07 AM

## 2023-05-28 NOTE — Plan of Care (Signed)
Problem: Education: Goal: Knowledge of General Education information will improve Description: Including pain rating scale, medication(s)/side effects and non-pharmacologic comfort measures Outcome: Progressing Pt understands he was admitted into the hospital for worsening SOB, fever of 102.87F, COPD exacerbation r/t viral PNA.  He is currently awaiting d/c home.     Problem: Clinical Measurements: Goal: Ability to maintain clinical measurements within normal limits will improve Outcome: Progressing Pt's VS WNL.    Problem: Clinical Measurements: Goal: Will remain free from infection Outcome: Progressing S/Sx of infection monitored and assessed q8 hours.  Pt has remained afebrile thus far.       Problem: Clinical Measurements: Goal: Respiratory complications will improve Outcome: Progressing Respiratory status monitored and assessed q8 hours.  Pt is on 3L of O2 with PO2 saturations at 97% and respirations of 16 breaths per minute.  He has endorse c/o SOB and DOE.      Problem: Activity: Goal: Risk for activity intolerance will decrease Outcome: Progressing Pt is a moderate assist of all his ADLs. He needs the assistance of RN staff to get OOB.   Problem: Nutrition: Goal: Adequate nutrition will be maintained Outcome: Progressing Pt is on a carb modified diet per MD's orders.  He has been able to tolerate his diet without s/sx of n/v or abdominal pain/ distention.     Problem: Elimination: Goal: Will not experience complications related to bowel motility Outcome: Progressing Pt LBM was in 05/28/2023.  He has not endorse c/o constipation.    Problem: Elimination: Goal: Will not experience complications related to urinary retention Outcome: Progressing Pt has denied c/o dysuria or abdominal distention/ pain.   Problem: Pain Management: Goal: General experience of comfort will improve Outcome: Progressing Pt has denied pain thus far.    Problem: Safety: Goal: Ability to  remain free from injury will improve Outcome: Progressing Pt has remained free from falls thus far.  Instructed pt to utilize RN call light for assistance.  Hourly rounds performed.  Bed alarm implemented to keep pt safe from falls.  Settings activated to third most sensitive mode.  Bed in lowest position, locked with two upper side rails engaged.  Belongings and call light within reach.    Problem: Skin Integrity: Goal: Risk for impaired skin integrity will decrease Outcome: Progressing Skin integrity monitored and assessed q-shift. Pt is on q2 hourly turns to prevent further skin impairment.  Tubes and drains assessed for device related pressure sores.  Pt is continent of both bowel and bladder.

## 2023-05-30 ENCOUNTER — Telehealth: Payer: Self-pay | Admitting: *Deleted

## 2023-05-30 ENCOUNTER — Other Ambulatory Visit: Payer: Self-pay | Admitting: Internal Medicine

## 2023-05-30 ENCOUNTER — Encounter: Payer: Self-pay | Admitting: *Deleted

## 2023-05-30 NOTE — Transitions of Care (Post Inpatient/ED Visit) (Signed)
05/30/2023  Name: Bradley MOELLER Sr. MRN: [id number] DOB: [birth year]  Today's TOC FU Call Status: Today's TOC FU Call Status:: Successful TOC FU Call Completed TOC FU Call Complete Date: 05/30/23  Transition Care Management Follow-up Telephone Call Date of Discharge: 05/28/23 Discharge Facility: [facility]) Type of Discharge: Inpatient Admission Primary Inpatient Discharge Diagnosis:: viral pneumonia; COPD exacerbation; recent arm/ wrist fracture How have you been since you were released from the hospital?: Better ("I am doing just fine; not having any problems.  I go see the orthopedic doctor tomorrow; thanks for getting me set up with [name] on Wednesday.  My wife is still in the rehab center, I hope she comes back home soon") Any questions or concerns?: No  Items Reviewed: Did you receive and understand the discharge instructions provided?: Yes (thoroughly reviewed with patient who verbalizes good understanding of same) Medications obtained,verified, and reconciled?: Partial Review Completed (Partial medication review; confirmed patient obtained/ is taking all newly Rx'd medications as instructed; self-manages medications and denies questions/ concerns around medications today) Reason for Partial Mediation Review: Patient declined-- stated time constraints Any new allergies since your discharge?: No Dietary orders reviewed?: Yes Type of Diet Ordered:: "As healthy as I can" Do you have support at home?: Yes People in Home: grandchild(ren) Name of Support/Comfort Primary Source: Reports independent in self-care activities; resides with supportive granddaughter and her family; family assists as/ if needed/ indicated  Medications Reviewed Today: Medications Reviewed Today     Reviewed by Michaela Corner, RN (Registered Nurse) on 05/30/23 at 1403  Med List Status: <None>   Medication Order Taking? Sig Documenting Provider Last Dose Status Informant  acetaminophen (TYLENOL) 500 MG  tablet [id number]  Take 1,000 mg by mouth every 8 (eight) hours as needed for headache or moderate pain. [provider]  Active Self  albuterol (VENTOLIN HFA) 108 (90 Base) MCG/ACT inhaler [id number]  Inhale 2 puffs into the lungs every 6 (six) hours as needed for wheezing or shortness of breath. Maretta Bees, MD  Active Self  amLODipine (NORVASC) 10 MG tablet [id number]  Take 1 tablet (10 mg total) by mouth daily. [name], MD  Active   aspirin 81 MG tablet [id number]  Take 81 mg by mouth every morning.  [provider]  Active Self  atorvastatin (LIPITOR) 40 MG tablet [id number]  Take 1 tablet (40 mg total) by mouth at bedtime. Alver Sorrow, NP  Active            Med Note Anselm Pancoast R   Wed Nov 10, 2022  1:24 PM)    bisoprolol (ZEBETA) 10 MG tablet [id number]  TAKE 1 TABLET DAILY [name], MD  Active   budesonide (PULMICORT) 0.5 MG/2ML nebulizer solution [id number]  Take 2 mLs (0.5 mg total) by nebulization 2 (two) times daily. [name], MD  Active Self  calcium-vitamin D (OSCAL WITH D) 500-200 MG-UNIT tablet [id number]  Take 1 tablet by mouth every morning. [provider]  Active Self  cholecalciferol (VITAMIN D3) 25 MCG (1000 UNIT) tablet [id number]  Take 1,000 Units by mouth every evening. [provider]  Active Self  dapagliflozin propanediol (FARXIGA) 10 MG TABS tablet [id number]  Take 1 tablet (10 mg total) by mouth daily before breakfast. [name], MD  Active   diphenhydramine-acetaminophen (TYLENOL PM) 25-500 MG TABS tablet [id number]  Take 1 tablet by mouth at bedtime as needed (sleep). [provider]  Active Self  fluticasone Aleda Grana)  50 MCG/ACT nasal spray [id number] Yes Place 1 spray into both nostrils daily for 7 days. Maretta Bees, MD Taking Active   furosemide (LASIX) 40 MG tablet [id number] Yes TAKE 2 TABLETS EVERY MORNING AND 1 TABLET LATE IN THE AFTERNOON [name], MD Taking Active   glucose blood  [facility] VERIO) test strip [id number]  USE TO CHECK BLOOD SUGAR NO MORE THAN TWICE A DAY Paz, Nolon Rod, MD  Active   guaiFENesin (MUCINEX) 600 MG 12 hr tablet [id number]  Take 600 mg by mouth 2 (two) times daily. [provider]  Active Self  ipratropium-albuterol (DUONEB) 0.5-2.5 (3) MG/3ML SOLN [id number]  Take 3 mLs by nebulization every 6 (six) hours as needed.  Patient taking differently: Take 3 mLs by nebulization in the morning and at bedtime.   [name], MD  Active Self  Lancets [facility] ULTRASOFT) lancets [id number]  Check blood sugars no more than twice daily [name], MD  Active Self  loratadine (CLARITIN) 10 MG tablet [id number] Yes Take 1 tablet (10 mg total) by mouth daily for 7 days. Ghimire, Werner Lean, MD Taking Active   Multiple Vitamins-Minerals (MULTIVITAMINS THER. W/MINERALS) TABS tablet [id number]  Take 1 tablet by mouth daily. [name], MD  Active Self  nitroGLYCERIN (NITROSTAT) 0.4 MG SL tablet [id number]  Place 1 tablet (0.4 mg total) under the tongue every 5 (five) minutes x 3 doses as needed for chest pain. [name], MD  Active Self           Med Note (CANTER, [name]   Tue Feb 15, 2023  2:11 PM) PRN  OXYGEN [id number] Yes Inhale 4 L/min into the lungs continuous. [provider] Taking Active   pantoprazole (PROTONIX) 40 MG tablet [id number]  Take 1 tablet (40 mg total) by mouth daily before breakfast. [name], MD  Active   predniSONE (DELTASONE) 10 MG tablet [id number] Yes Take 40 mg daily for 1 day, 30 mg daily for 1 day, 20 mg daily for 1 days,10 mg daily for 1 day, then stop Maretta Bees, MD Taking Active   tamsulosin (FLOMAX) 0.4 MG CAPS capsule [id number]  Take 1 capsule (0.4 mg total) by mouth daily after supper. [name], MD  Active   zolpidem (AMBIEN) 10 MG tablet [id number]  Take 10 mg by mouth at bedtime as needed for sleep. [provider]  Active            Home Care and Equipment/Supplies: Were Home Health Services  Ordered?: No Any new equipment or medical supplies ordered?: No  Functional Questionnaire: Do you need assistance with bathing/showering or dressing?: No Do you need assistance with meal preparation?: No Do you need assistance with eating?: No Do you have difficulty maintaining continence: No Do you need assistance with getting out of bed/getting out of a chair/moving?: No Do you have difficulty managing or taking your medications?: No  Follow up appointments reviewed: PCP Follow-up appointment confirmed?: Yes (care coordination outreach in real-time with scheduling care guide to successfully schedule hospital follow up PCP appointment 06/01/23) Date of PCP follow-up appointment?: 06/01/23 Follow-up Provider: PCP, [name] [facility] Follow-up appointment confirmed?: Yes Date of Specialist follow-up appointment?: 05/31/23 Follow-Up Specialty Provider:: orthopedic provider Do you need transportation to your follow-up appointment?: No Do you understand care options if your condition(s) worsen?: Yes-patient verbalized understanding  SDOH Interventions Today    Flowsheet Row Most Recent Value  SDOH Interventions   Food Insecurity Interventions  Intervention Not Indicated  Transportation Interventions Intervention Not Indicated  [reports drives self,  family assists as/ if needed]      TOC Interventions Today    Flowsheet Row Most Recent Value  TOC Interventions   TOC Interventions Discussed/Reviewed TOC Interventions Discussed, Arranged PCP follow up within 7 days/Care Guide scheduled  [Patient declines need for ongoing/ further care coordination outreach,  no care coordination needs identified at time of TOC call today]      Interventions Today    Flowsheet Row Most Recent Value  Chronic Disease   Chronic disease during today's visit Chronic Obstructive Pulmonary Disease (COPD), Other  [viral pneumonia]  General Interventions   General Interventions Discussed/Reviewed  General Interventions Discussed, Doctor Visits, Durable Medical Equipment (DME)  Doctor Visits Discussed/Reviewed Doctor Visits Discussed, PCP, Specialist  Durable Medical Equipment (DME) Oxygen, Other  [confirmed not currently requiring/ using assistive devices]  PCP/Specialist Visits Compliance with follow-up visit  Education Interventions   Education Provided Provided Education  Provided Verbal Education On Other  [Fall prevention]  Nutrition Interventions   Nutrition Discussed/Reviewed Nutrition Discussed  Pharmacy Interventions   Pharmacy Dicussed/Reviewed Pharmacy Topics Discussed  Safety Interventions   Safety Discussed/Reviewed Safety Discussed, Fall Risk      Caryl Pina, RN, BSN, CCRN Alumnus RN CM Care Coordination/ Transition of Care- [facility] Care Management [phone]: direct office

## 2023-06-01 ENCOUNTER — Ambulatory Visit (INDEPENDENT_AMBULATORY_CARE_PROVIDER_SITE_OTHER): Payer: Medicare Other | Admitting: Internal Medicine

## 2023-06-01 VITALS — BP 112/69 | HR 84 | Temp 97.6°F | Resp 16 | Ht 69.0 in | Wt 176.4 lb

## 2023-06-01 DIAGNOSIS — J9611 Chronic respiratory failure with hypoxia: Secondary | ICD-10-CM | POA: Diagnosis not present

## 2023-06-01 DIAGNOSIS — J9612 Chronic respiratory failure with hypercapnia: Secondary | ICD-10-CM

## 2023-06-01 DIAGNOSIS — J441 Chronic obstructive pulmonary disease with (acute) exacerbation: Secondary | ICD-10-CM | POA: Diagnosis not present

## 2023-06-01 LAB — COMPREHENSIVE METABOLIC PANEL
ALT: 30 U/L (ref 0–53)
AST: 19 U/L (ref 0–37)
Albumin: 3.7 g/dL (ref 3.5–5.2)
Alkaline Phosphatase: 64 U/L (ref 39–117)
BUN: 13 mg/dL (ref 6–23)
CO2: 31 mEq/L (ref 19–32)
Calcium: 8.7 mg/dL (ref 8.4–10.5)
Chloride: 103 mEq/L (ref 96–112)
Creatinine, Ser: 0.79 mg/dL (ref 0.40–1.50)
GFR: 82.85 mL/min (ref >60.00)
Glucose, Bld: 108 mg/dL — ABNORMAL HIGH (ref 70–99)
Potassium: 4.8 mEq/L (ref 3.5–5.1)
Sodium: 141 mEq/L (ref 135–145)
Total Bilirubin: 0.6 mg/dL (ref 0.2–1.2)
Total Protein: 6.1 g/dL (ref 6.0–8.3)

## 2023-06-01 LAB — CBC WITH DIFFERENTIAL/PLATELET
Basophils Absolute: 0 10*3/uL (ref 0.0–0.1)
Basophils Relative: 0.2 % (ref 0.0–3.0)
Eosinophils Absolute: 0.1 10*3/uL (ref 0.0–0.7)
Eosinophils Relative: 1.5 % (ref 0.0–5.0)
HCT: 42.8 % (ref 39.0–52.0)
Hemoglobin: 14.1 g/dL (ref 13.0–17.0)
Lymphocytes Relative: 23.4 % (ref 12.0–46.0)
Lymphs Abs: 2 10*3/uL (ref 0.7–4.0)
MCHC: 32.9 g/dL (ref 30.0–36.0)
MCV: 92.5 fl (ref 78.0–100.0)
Monocytes Absolute: 0.6 10*3/uL (ref 0.1–1.0)
Monocytes Relative: 7.2 % (ref 3.0–12.0)
Neutro Abs: 5.9 10*3/uL (ref 1.4–7.7)
Neutrophils Relative %: 67.7 % (ref 43.0–77.0)
Platelets: 284 10*3/uL (ref 150.0–400.0)
RBC: 4.63 Mil/uL (ref 4.22–5.81)
RDW: 15.2 % (ref 11.5–15.5)
WBC: 8.7 10*3/uL (ref 4.0–10.5)

## 2023-06-01 NOTE — Progress Notes (Signed)
Subjective:    Patient ID: Bradley [name]., male    DOB: January 05, 1941, 82 y.o.   MRN: [id number]  DOS:  06/01/2023 Type of visit - description: Hospital follow-up  Admitted to hospital and discharged 05/28/2023. Acute on chronic respiratory failure due to COPD exacerbation.  Improved with IV steroids, bronchodilators, essentially went back to baseline before discharge.   Since he left the hospital is doing well. Reports no further fever or chills. Essentially no cough. No chest pain or difficulty breathing. No lower extremity edema  Review of Systems See above   Past Medical History:  Diagnosis Date   Abnormal CT scan, chest 2012   CT chest, several lymphadenopathies. Sees pulmonary   Anemia    intermittent   Arthritis    "?back" (09/19/2018)   Atrial fibrillation (HCC)    s/p AV node ablation & BiV PPM implantation 09/08/11 (op dictation pending)   BPH (benign prostatic hyperplasia)    Saw Dr [name] 2004, normal renal u/s   Bronchitis 08/26/2017   CAD (coronary artery disease)    CHF (congestive heart failure) (HCC)    Thought primarily to be non-systolic although EF down (EF 40-98% 12/2010, down to 35-40% 09/05/11), cath 2008 with no CAD, nuclear study 07/2011 showing Small area of reversibility in the distal ant/lat wall the left ventricle suspicious for ischemia/septal wall HK but felt to be low risk  (per D/C Summary 07/2011)   Chronic bronchitis (HCC)    "get it ~ q yr"   Chronic diastolic heart failure (HCC)         Chronic respiratory failure with hypoxia and hypercapnia (HCC) 09/14/2010   Followed in Pulmonary clinic/ [facility]/ [place]  Started on 02 2lpm at discharge 09/23/11   - PFT's 10/28/2011  FEV1  1.40 (51%)  with ratio 70 and no better p B2 and DLCO 53 corrects to 101    - PFT's 10/18/2014    FEV1 1.71 (59%) with ratio 68 and no sign change p B2 and DLCO 53 corrects to 85  -  HC03   07/28/20  = 33  -  HCO3   03/17/21     = 31     Heart murmur    HLD  (hyperlipidemia)    HTN (hypertension)    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   Pleural effusion 2008   S/p decortication   Pneumonia 08/02/2011   Pulmonary HTN (HCC)    per cath 2008   Type II diabetes mellitus (HCC) 1999    Past Surgical History:  Procedure Laterality Date   BI-VENTRICULAR PACEMAKER INSERTION Left 09/08/2011   Procedure: BI-VENTRICULAR PACEMAKER INSERTION (CRT-P);  Surgeon: Marinus Maw, MD;  Location[facility] CATH LAB;  Service: Cardiovascular;  Laterality: Left;   BIV PACEMAKER GENERATOR CHANGEOUT N/A 11/10/2022   Procedure: BIV PACEMAKER GENERATOR CHANGEOUT;  Surgeon: Marinus Maw, MD;  Location: [facility] INVASIVE CV LAB;  Service: Cardiovascular;  Laterality: N/A;   CARDIAC CATHETERIZATION     "couple times; never had balloon or stent" (09/19/2018)   CATARACT EXTRACTION W/ INTRAOCULAR LENS  IMPLANT, BILATERAL Bilateral 08/2018   COLONOSCOPY  03/10/11   normal   INSERT / REPLACE / REMOVE PACEMAKER  09/08/11   pacemaker placement  LUNG DECORTICATION     PLEURAL SCARIFICATION     pneumothorax with fibrothorax  ~ 2010   RIGHT/LEFT HEART CATH AND CORONARY ANGIOGRAPHY N/A 10/05/2022   Procedure: RIGHT/LEFT HEART CATH AND CORONARY ANGIOGRAPHY;  Surgeon: Corky Crafts, MD;  Location: [facility] INVASIVE CV LAB;  Service: Cardiovascular;  Laterality: N/A;   TONSILLECTOMY     "as a kid"     Current Outpatient Medications  Medication Instructions   acetaminophen (TYLENOL) 1,000 mg, Oral, Every 8 hours PRN   albuterol (VENTOLIN HFA) 108 (90 Base) MCG/ACT inhaler 2 puffs, Inhalation, Every 6 hours PRN   amLODipine (NORVASC) 10 mg, Oral, Daily   aspirin 81 mg, Oral, Every morning   atorvastatin (LIPITOR) 40 mg, Oral, Daily at bedtime   bisoprolol (ZEBETA) 10 mg, Oral, Daily   budesonide  (PULMICORT) 0.5 mg, Nebulization, 2 times daily   calcium-vitamin D (OSCAL WITH D) 500-200 MG-UNIT tablet 1 tablet, Oral, Every morning   cholecalciferol (VITAMIN D3) 1,000 Units, Oral, Every evening   dapagliflozin propanediol (FARXIGA) 10 mg, Oral, Daily before breakfast   diphenhydramine-acetaminophen (TYLENOL PM) 25-500 MG TABS tablet 1 tablet, Oral, At bedtime PRN   fluticasone (FLONASE) 50 MCG/ACT nasal spray 1 spray, Each Nare, Daily   furosemide (LASIX) 40 MG tablet TAKE 2 TABLETS EVERY MORNING AND 1 TABLET LATE IN THE AFTERNOON   glucose blood (ONETOUCH VERIO) test strip USE TO CHECK BLOOD SUGAR NO MORE THAN TWICE A DAY   guaiFENesin (MUCINEX) 600 mg, Oral, 2 times daily   ipratropium-albuterol (DUONEB) 0.5-2.5 (3) MG/3ML SOLN 3 mLs, Nebulization, Every 6 hours PRN   Lancets (ONETOUCH ULTRASOFT) lancets Check blood sugars no more than twice daily   loratadine (CLARITIN) 10 mg, Oral, Daily   Multiple Vitamins-Minerals (MULTIVITAMINS THER. W/MINERALS) TABS tablet 1 tablet, Oral, Daily   nitroGLYCERIN (NITROSTAT) 0.4 mg, Sublingual, Every 5 min x3 PRN   OXYGEN 4 L/min, Inhalation, Continuous   pantoprazole (PROTONIX) 40 mg, Oral, Daily before breakfast   predniSONE (DELTASONE) 10 MG tablet Take 40 mg daily for 1 day, 30 mg daily for 1 day, 20 mg daily for 1 days,10 mg daily for 1 day, then stop   tamsulosin (FLOMAX) 0.4 mg, Oral, Daily after supper   zolpidem (AMBIEN) 10 mg, Oral, At bedtime PRN       Objective:   Physical Exam BP 112/69 (BP Location: Right Arm, Cuff Size: Normal)   Pulse 84   Temp 97.6 F (36.4 C) (Oral)   Resp 16   Ht 5\' 9"  (1.753 m)   Wt 176 lb 6.4 oz (80 kg)   SpO2 97%   BMI 26.05 kg/m  General:   Well developed, NAD, BMI noted. HEENT:  Normocephalic . Face symmetric, atraumatic Lungs:  Decreased breath sounds but clear   Heart: RRR,  no murmur.  Lower extremities: no pretibial edema bilaterally  Skin: Not pale. Not jaundice Neurologic:  alert &  oriented X3.  Speech normal, gait appropriate for age and unassisted Psych--  Cognition and judgment appear intact.  Cooperative with normal attention span and concentration.  Behavior appropriate. No anxious or depressed appearing.      Assessment      Assessment  DM HTN Hyperlipidemia GERD Anxiety- insomnia  BPH Cardiovascular:Dr Ladona Ridgel  --  Cath x 2 [id number]: no significant CAD -- CHF, non-ischemic  --Atrial fibrillation; h/o AV node ablation, has a Pacemaker (biventricular pacing) --h/o IC bleeding: Not anticoagulated --Peripheral vascular disease Pulmonary: Dr [name], last OV 10-18-2014, f/u prn --COPD,  severe, chronic resp failure, night O2 prn, has portable O2  --Pleural effusion s/p decortication --Pulmonary hypertension  Stasis dermatitis  Osteoporosis: T-score -4.1.  Evenity none available from men.  First Prolia shot 03/14/2023.  PLAN:  COPD exacerbation in the context of chronic respiratory failure: Admitted to hospital and treated with standard protocols. He improved significantly and he feels back to normal. On lung exam, breath sounds are decreased but otherwise is clear. Plan:  Check CMP and CBC as requested by the hospital team. Finish prednisone as prescribed Continue his routine medicines. He does not have his portable oxygen tank with him, left it in the car, O2 sat however is good. Recommend to go back to oxygen ASAP. Osteoporosis: T-score -4.1.  Evenity none available from men.  First Prolia shot 03/14/2023. RTC scheduled for October

## 2023-06-01 NOTE — Patient Instructions (Addendum)
Proceed with blood work today.  Continue taking prednisone until you finish it.  Go back to your oxygen as soon as you can   See you in October.  If you you are getting sick again let us know

## 2023-06-01 NOTE — Assessment & Plan Note (Signed)
COPD exacerbation in the context of chronic respiratory failure: Admitted to hospital and treated with standard protocols. He improved significantly and he feels back to normal. On lung exam, breath sounds are decreased but otherwise is clear. Plan:  Check CMP and CBC as requested by the hospital team. Finish prednisone as prescribed Continue his routine medicines. He does not have his portable oxygen tank with him, left it in the car, O2 sat however is good. Recommend to go back to oxygen ASAP. Osteoporosis: T-score -4.1.  Evenity none available from men.  First Prolia shot 03/14/2023. RTC scheduled for October

## 2023-06-20 ENCOUNTER — Ambulatory Visit: Payer: Medicare Other | Admitting: Pharmacist

## 2023-06-20 DIAGNOSIS — I10 Essential (primary) hypertension: Secondary | ICD-10-CM

## 2023-06-20 DIAGNOSIS — I5032 Chronic diastolic (congestive) heart failure: Secondary | ICD-10-CM

## 2023-06-20 DIAGNOSIS — E1159 Type 2 diabetes mellitus with other circulatory complications: Secondary | ICD-10-CM

## 2023-06-20 DIAGNOSIS — M810 Age-related osteoporosis without current pathological fracture: Secondary | ICD-10-CM

## 2023-06-20 DIAGNOSIS — J449 Chronic obstructive pulmonary disease, unspecified: Secondary | ICD-10-CM

## 2023-06-20 DIAGNOSIS — J9611 Chronic respiratory failure with hypoxia: Secondary | ICD-10-CM

## 2023-06-20 NOTE — Progress Notes (Signed)
Pharmacy Note  06/20/2023 Name: Bradley SALVATI Sr. MRN: [id number] DOB: [birth year]  Subjective: Bradley Searles Sr. is a 82 y.o. year old male who is a primary care patient of [name], MD. Clinical Pharmacist Practitioner referral was placed to assist with medication management.    Engaged with patient by telephone for follow up visit today.  Recent office and hospital visits:  05/14/2023 - ED Visit at [facility] for fall with injury to left wrist. Noted to have closed fracture to distal end of left radius.   05/25/2023 to 05/28/2023 - Hospitalization for viral pneumonia and COPD exacerbation.  Medications started at discharge:  Flonase NS 1 spray in both nostrils once a day for 7 days.  Loratidine 10mg  daily for 7 days Prednisone 10mg  - take 40mg  x 1 day, 30mg  for 1 day, 20mg  for 1 day, 10mg  for 1 day, then stop.  Medications changed or stopped at discharge: none  COPD -  Current therapy: budesonide nebs twice a day and Duonebs as needed.   Patient also has albuterol inhaler to use when away from home as needed for shortness of breath and wheezing. She reports he has not used any in the last 3 months.   O2 setting is 4L  Reports no concerns with breathing. He completes flonase, prednisone and loratadine.  Hypertension / CHF -  Current therapy: amlodipine 10mg  daily, furosemide 40mg  twice a day, Farxiga 10mg  daily and bisoprolol 10mg  daily.  Checking blood pressure at home daily. He reports blood pressure has a been good but did not have specific readings to report.   BP Readings from Last 3 Encounters:  06/01/23 112/69  05/28/23 129/74  05/14/23 127/77    Patient is checking weight daily. Reports weight has been stable. Only varies by 1 to 2 lbs.   He endorsed that he knows to contact office if has weight gain more than 3 lbs in 24 hours or 5lbs in 1 week or if he is having symptoms of worsening heart failure.   Hyperlipidemia:  Current therapy: atorvastatin  40mg  daily.  Dose of atorvastatin was increased about 3 or 4 months ago. LDL decreased from 108 (10/19/2022) to 76 (11/15/2022).   Osteoporosis:  Current therapy - prolia 60mg  every 6 months.  03/14/2023 received Prolia- next due around 09/13/2023 Calcium + D once daily and MVI once daily   Dr Drue Novel initially recommended Evenity x 12 months but was denied by insurance.   DEXA 02/17/2023 - Forearm Radius 33% T-score of -4.1  Lumbar spine was not utilized due to advanced degenerative changes.  DualFemur Total Mean  -2.7  Left Forearm Radius 33% was -4.1  Objective: Review of patient status, including review of consultants reports, laboratory and other test data, was performed as part of comprehensive evaluation and provision of chronic care management services.   Lab Results  Component Value Date   CREATININE 0.79 06/01/2023   CREATININE 0.84 05/27/2023   CREATININE 1.03 05/26/2023    Lab Results  Component Value Date   HGBA1C 6.3 (H) 05/26/2023       Component Value Date/Time   CHOL 142 11/15/2022 0931   TRIG 76 11/15/2022 0931   HDL 54 11/15/2022 0931   CHOLHDL 2.6 11/15/2022 0931   CHOLHDL 3.1 10/07/2022 0506   VLDL 13 10/07/2022 0506   LDLCALC 73 11/15/2022 0931   LDLDIRECT 76 11/15/2022 0928   Lab Results  Component Value Date   CHOL 142 11/15/2022  CHOL 180 10/07/2022   CHOL 141 11/11/2021   Lab Results  Component Value Date   HDL 54 11/15/2022   HDL 58 10/07/2022   HDL 41.10 11/11/2021   Lab Results  Component Value Date   LDLCALC 73 11/15/2022   LDLCALC 109 (H) 10/07/2022   LDLCALC 70 11/11/2021   Lab Results  Component Value Date   TRIG 76 11/15/2022   TRIG 67 10/07/2022   TRIG 148.0 11/11/2021   Lab Results  Component Value Date   CHOLHDL 2.6 11/15/2022   CHOLHDL 3.1 10/07/2022   CHOLHDL 3 11/11/2021   Lab Results  Component Value Date   LDLDIRECT 76 11/15/2022   LDLDIRECT 108 (H) 10/19/2022     Clinical ASCVD: Yes  The ASCVD Risk  score (Arnett DK, et al., 2019) failed to calculate for the following reasons:   The 2019 ASCVD risk score is only valid for ages 1 to 12   The patient has a prior MI or stroke diagnosis    BP Readings from Last 3 Encounters:  06/01/23 112/69  05/28/23 129/74  05/14/23 127/77     Allergies  Allergen Reactions   Hydrocodone Other (See Comments)    "given to him in the hospital; went thru withdrawals once home; dr said not to take it again" (09/27/2012) dizziness   Ultram [Tramadol] Other (See Comments)    "given to him in the hospital; went thru withdrawals once home; dr said not to take it again" (09/27/2012) dizziness   Cialis [Tadalafil] Other (See Comments)    Headache Backache  Dizziness    Avelox [Moxifloxacin Hydrochloride] Itching and Other (See Comments)    Headache Dizziness    Medications Reviewed Today   Medications were not reviewed in this encounter     Patient Active Problem List   Diagnosis Date Noted   Viral pneumonia 05/26/2023   Acute on chronic congestive heart failure (HCC) 10/05/2022   Acute respiratory failure with hypoxemia (HCC) 08/27/2022   COVID 08/23/2022   Displaced fracture of shaft of third metacarpal bone, left hand, sub encounter for closed fracture 08/03/2022   ILD (interstitial lung disease) (HCC) 07/30/2022   CAD (coronary artery disease) 07/19/2022   Acute on chronic respiratory failure with hypoxia (HCC) 07/18/2022   GERD without esophagitis 07/30/2021   Cor pulmonale (chronic) (HCC) 08/05/2020   COPD exacerbation (HCC) 12/22/2018   COPD,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 12/22/2018   PCP NOTES >>> 07/15/2015   Annual physical exam 05/08/2012   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   Sinoatrial node dysfunction (HCC)    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   Chronic diastolic heart failure (HCC)    PAD (peripheral artery disease) (HCC) 10/22/2010   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  Patient affirms current coverage meets needs. [place] has Medicare Part D coverage and additional coverage thru [employer]  retirement benefits.    Assessment / Plan:  Hypertension / CHF: goal blood pressure < 130/80 Continue bisoprolol 10mg  daily and amlodipine 10mg  daily for blood pressure - monitor for edema Continue [name]  and furosemide for CHF Continue to check weight daily Continue to limit intake of sodium in diet.  Check blood pressure 2 to 3 times per week and record (can record on same sheet as weight)   COPD:  Continue budesonide and DuoNeb nebs  Reminded to use albuterol in need when away from home and gets shortness of breath   Hyperlipidemia: goal LDL < 70 Continue to take atorvastatin 40mg  daily  Osteoporosis:  Continue Prolia 60mg  every 6 months. Next dose due around 09/13/2023 Continue Calcium + D once daily and MVI once daily   Medication Management:  Reviewed med list and updated Reviewed refill history and adherence. Reminded that [name] will be due to be refilled soon.   Follow Up:  Telephone follow up appointment with care management team member scheduled for:  3 months   Henrene Pastor, PharmD Clinical Pharmacist [facility]  - [facility]

## 2023-06-28 ENCOUNTER — Encounter (HOSPITAL_BASED_OUTPATIENT_CLINIC_OR_DEPARTMENT_OTHER): Payer: Self-pay | Admitting: Pediatrics

## 2023-06-28 ENCOUNTER — Other Ambulatory Visit: Payer: Self-pay

## 2023-06-28 ENCOUNTER — Emergency Department (HOSPITAL_BASED_OUTPATIENT_CLINIC_OR_DEPARTMENT_OTHER)
Admission: EM | Admit: 2023-06-28 | Discharge: 2023-06-28 | Disposition: A | Discharge status: Home / Self Care | Payer: Medicare Other | Attending: Emergency Medicine | Admitting: Emergency Medicine

## 2023-06-28 ENCOUNTER — Emergency Department (HOSPITAL_BASED_OUTPATIENT_CLINIC_OR_DEPARTMENT_OTHER): Payer: Medicare Other

## 2023-06-28 DIAGNOSIS — S0990XA Unspecified injury of head, initial encounter: Secondary | ICD-10-CM | POA: Diagnosis present

## 2023-06-28 DIAGNOSIS — S0181XA Laceration without foreign body of other part of head, initial encounter: Secondary | ICD-10-CM | POA: Insufficient documentation

## 2023-06-28 DIAGNOSIS — Y92481 Parking lot as the place of occurrence of the external cause: Secondary | ICD-10-CM | POA: Diagnosis not present

## 2023-06-28 DIAGNOSIS — W010XXA Fall on same level from slipping, tripping and stumbling without subsequent striking against object, initial encounter: Secondary | ICD-10-CM | POA: Insufficient documentation

## 2023-06-28 DIAGNOSIS — W19XXXA Unspecified fall, initial encounter: Secondary | ICD-10-CM

## 2023-06-28 NOTE — ED Notes (Signed)
Pt states he is trying to set up a ride home

## 2023-06-28 NOTE — ED Notes (Signed)
Patient reports he uses supplemental o2 at 4L on a regular basis.

## 2023-06-28 NOTE — ED Triage Notes (Signed)
C/O fall yesterday while stepping out of the car door and unable to get up for 10 mins. Sustained abrasion on left side of head, -LOC.

## 2023-06-28 NOTE — ED Provider Notes (Signed)
[facility] Provider Note   CSN: [id number] Arrival date & time: 06/28/23  1642     History Chief Complaint  Patient presents with   Fall    HPI Bradley FLUHARTY Sr. is a 82 y.o. male presenting for ground-level fall.  States he was visiting his wife at the skilled nursing facility when he slipped in the rain walking in from the parking lot.  Denies fevers chills nausea vomit syncope shortness of breath otherwise ambulatory tolerating PO intake.  No symptoms today.  Abrasion to the left anterior forehead with no other injuries appreciated or complaints..   Patient's recorded medical, surgical, social, medication list and allergies were reviewed in the Snapshot window as part of the initial history.   Review of Systems   Review of Systems  Constitutional:  Negative for chills and fever.  HENT:  Negative for ear pain and sore throat.   Eyes:  Negative for pain and visual disturbance.  Respiratory:  Negative for cough and shortness of breath.   Cardiovascular:  Negative for chest pain and palpitations.  Gastrointestinal:  Negative for abdominal pain and vomiting.  Genitourinary:  Negative for dysuria and hematuria.  Musculoskeletal:  Negative for arthralgias and back pain.  Skin:  Negative for color change and rash.  Neurological:  Negative for seizures and syncope.  All other systems reviewed and are negative.   Physical Exam Updated Vital Signs BP 130/80 (BP Location: Right Arm)   Pulse 89   Temp 98.5 F (36.9 C)   Resp 20   Ht 5\' 9"  (1.753 m)   Wt 74.8 kg   SpO2 90%   BMI 24.37 kg/m  Physical Exam Vitals and nursing note reviewed.  Constitutional:      General: He is not in acute distress.    Appearance: He is well-developed.  HENT:     Head: Normocephalic and atraumatic.  Eyes:     Conjunctiva/sclera: Conjunctivae normal.  Cardiovascular:     Rate and Rhythm: Normal rate and regular rhythm.     Heart sounds: No  murmur heard. Pulmonary:     Effort: Pulmonary effort is normal. No respiratory distress.     Breath sounds: Normal breath sounds.  Abdominal:     Palpations: Abdomen is soft.     Tenderness: There is no abdominal tenderness.  Musculoskeletal:        General: No swelling.     Cervical back: Neck supple.  Skin:    General: Skin is warm and dry.     Capillary Refill: Capillary refill takes less than 2 seconds.  Neurological:     Mental Status: He is alert.  Psychiatric:        Mood and Affect: Mood normal.      ED Course/ Medical Decision Making/ A&P    Procedures .Marland KitchenLaceration Repair  Date/Time: 06/28/2023 6:57 PM  Performed by: Glyn Ade, MD Authorized by: Glyn Ade, MD   Consent:    Consent obtained:  Verbal   Consent given by:  Parent   Risks, benefits, and alternatives were discussed: yes   Laceration details:    Length (cm):  1 Treatment:    Amount of cleaning:  Standard Skin repair:    Repair method:  Tissue adhesive Approximation:    Approximation:  Close Repair type:    Repair type:  Simple Post-procedure details:    Procedure completion:  Tolerated    Medications Ordered in ED Medications - No data to display  Medical Decision Making:    Bradley Searles Sr. is a 82 y.o. male who presented to the ED today with a moderate mechanisma trauma, detailed above.    Given this mechanism of trauma, a full physical exam was performed. Notably, patient was HDS in NAD.   Reviewed and confirmed nursing documentation for past medical history, family history, social history.    Initial Assessment/Plan:   This is a patient presenting with a moderate mechanism trauma.  As such, I have considered intracranial injuries including intracranial hemorrhage, intrathoracic injuries including blunt myocardial or blunt lung injury, blunt abdominal injuries including aortic dissection, bladder injury, spleen injury, liver injury and I have considered orthopedic  injuries including extremity or spinal injury.  With the patient's presentation of moderate mechanism trauma but an otherwise reassuring exam, patient warrants targeted evaluation for potential traumatic injuries. Will proceed with targeted evaluation for potential injuries. Will proceed with CT head. Objective evaluation resulted with no acute pathology.    On reassessment patient continued to be asymptomatic.  CT head with no focal intracranial pathology.  Laceration closed above.  Patient stated he is up-to-date on all vaccinations.  Patient is stable for outpatient care and management      Clinical Impression:  1. Fall, initial encounter      Discharge   Final Clinical Impression(s) / ED Diagnoses Final diagnoses:  Fall, initial encounter    Rx / DC Orders ED Discharge Orders     None         Glyn Ade, MD 06/28/23 1857

## 2023-07-04 ENCOUNTER — Telehealth: Payer: Self-pay | Admitting: Internal Medicine

## 2023-07-04 ENCOUNTER — Emergency Department (HOSPITAL_COMMUNITY): Payer: Medicare Other

## 2023-07-04 ENCOUNTER — Other Ambulatory Visit: Payer: Self-pay

## 2023-07-04 ENCOUNTER — Inpatient Hospital Stay (HOSPITAL_COMMUNITY)
Admission: EM | Admit: 2023-07-04 | Discharge: 2023-07-11 | DRG: 871 | Disposition: A | Discharge status: Home Health | Payer: Medicare Other | Attending: Internal Medicine | Admitting: Internal Medicine

## 2023-07-04 ENCOUNTER — Encounter (HOSPITAL_COMMUNITY): Payer: Self-pay | Admitting: Emergency Medicine

## 2023-07-04 DIAGNOSIS — R0789 Other chest pain: Secondary | ICD-10-CM | POA: Diagnosis not present

## 2023-07-04 DIAGNOSIS — J9612 Chronic respiratory failure with hypercapnia: Secondary | ICD-10-CM

## 2023-07-04 DIAGNOSIS — Z8673 Personal history of transient ischemic attack (TIA), and cerebral infarction without residual deficits: Secondary | ICD-10-CM

## 2023-07-04 DIAGNOSIS — J4489 Other specified chronic obstructive pulmonary disease: Secondary | ICD-10-CM | POA: Diagnosis present

## 2023-07-04 DIAGNOSIS — G9340 Encephalopathy, unspecified: Secondary | ICD-10-CM | POA: Diagnosis present

## 2023-07-04 DIAGNOSIS — E876 Hypokalemia: Secondary | ICD-10-CM | POA: Diagnosis present

## 2023-07-04 DIAGNOSIS — Z888 Allergy status to other drugs, medicaments and biological substances status: Secondary | ICD-10-CM

## 2023-07-04 DIAGNOSIS — Z961 Presence of intraocular lens: Secondary | ICD-10-CM | POA: Diagnosis present

## 2023-07-04 DIAGNOSIS — Z885 Allergy status to narcotic agent status: Secondary | ICD-10-CM

## 2023-07-04 DIAGNOSIS — I110 Hypertensive heart disease with heart failure: Secondary | ICD-10-CM | POA: Diagnosis present

## 2023-07-04 DIAGNOSIS — E1151 Type 2 diabetes mellitus with diabetic peripheral angiopathy without gangrene: Secondary | ICD-10-CM | POA: Diagnosis present

## 2023-07-04 DIAGNOSIS — L03115 Cellulitis of right lower limb: Secondary | ICD-10-CM | POA: Diagnosis not present

## 2023-07-04 DIAGNOSIS — B9689 Other specified bacterial agents as the cause of diseases classified elsewhere: Secondary | ICD-10-CM | POA: Diagnosis present

## 2023-07-04 DIAGNOSIS — I5032 Chronic diastolic (congestive) heart failure: Secondary | ICD-10-CM | POA: Diagnosis present

## 2023-07-04 DIAGNOSIS — E119 Type 2 diabetes mellitus without complications: Secondary | ICD-10-CM

## 2023-07-04 DIAGNOSIS — Z881 Allergy status to other antibiotic agents status: Secondary | ICD-10-CM

## 2023-07-04 DIAGNOSIS — Z8701 Personal history of pneumonia (recurrent): Secondary | ICD-10-CM

## 2023-07-04 DIAGNOSIS — W1839XA Other fall on same level, initial encounter: Secondary | ICD-10-CM | POA: Diagnosis present

## 2023-07-04 DIAGNOSIS — W19XXXA Unspecified fall, initial encounter: Principal | ICD-10-CM

## 2023-07-04 DIAGNOSIS — Z7982 Long term (current) use of aspirin: Secondary | ICD-10-CM

## 2023-07-04 DIAGNOSIS — E1159 Type 2 diabetes mellitus with other circulatory complications: Secondary | ICD-10-CM

## 2023-07-04 DIAGNOSIS — Z9181 History of falling: Secondary | ICD-10-CM

## 2023-07-04 DIAGNOSIS — Z87891 Personal history of nicotine dependence: Secondary | ICD-10-CM

## 2023-07-04 DIAGNOSIS — I2510 Atherosclerotic heart disease of native coronary artery without angina pectoris: Secondary | ICD-10-CM | POA: Diagnosis not present

## 2023-07-04 DIAGNOSIS — G43909 Migraine, unspecified, not intractable, without status migrainosus: Secondary | ICD-10-CM | POA: Diagnosis present

## 2023-07-04 DIAGNOSIS — Z833 Family history of diabetes mellitus: Secondary | ICD-10-CM

## 2023-07-04 DIAGNOSIS — Z9842 Cataract extraction status, left eye: Secondary | ICD-10-CM

## 2023-07-04 DIAGNOSIS — N400 Enlarged prostate without lower urinary tract symptoms: Secondary | ICD-10-CM | POA: Diagnosis present

## 2023-07-04 DIAGNOSIS — J9611 Chronic respiratory failure with hypoxia: Secondary | ICD-10-CM | POA: Diagnosis present

## 2023-07-04 DIAGNOSIS — I4891 Unspecified atrial fibrillation: Secondary | ICD-10-CM | POA: Diagnosis present

## 2023-07-04 DIAGNOSIS — Z79899 Other long term (current) drug therapy: Secondary | ICD-10-CM

## 2023-07-04 DIAGNOSIS — M1990 Unspecified osteoarthritis, unspecified site: Secondary | ICD-10-CM | POA: Diagnosis present

## 2023-07-04 DIAGNOSIS — D72829 Elevated white blood cell count, unspecified: Secondary | ICD-10-CM

## 2023-07-04 DIAGNOSIS — I10 Essential (primary) hypertension: Secondary | ICD-10-CM | POA: Diagnosis present

## 2023-07-04 DIAGNOSIS — E785 Hyperlipidemia, unspecified: Secondary | ICD-10-CM | POA: Diagnosis present

## 2023-07-04 DIAGNOSIS — N179 Acute kidney failure, unspecified: Secondary | ICD-10-CM | POA: Diagnosis not present

## 2023-07-04 DIAGNOSIS — Z7984 Long term (current) use of oral hypoglycemic drugs: Secondary | ICD-10-CM

## 2023-07-04 DIAGNOSIS — G9341 Metabolic encephalopathy: Secondary | ICD-10-CM | POA: Diagnosis present

## 2023-07-04 DIAGNOSIS — G4700 Insomnia, unspecified: Secondary | ICD-10-CM | POA: Diagnosis present

## 2023-07-04 DIAGNOSIS — R5381 Other malaise: Secondary | ICD-10-CM | POA: Diagnosis present

## 2023-07-04 DIAGNOSIS — I272 Pulmonary hypertension, unspecified: Secondary | ICD-10-CM | POA: Diagnosis present

## 2023-07-04 DIAGNOSIS — A419 Sepsis, unspecified organism: Principal | ICD-10-CM | POA: Diagnosis present

## 2023-07-04 DIAGNOSIS — Z9841 Cataract extraction status, right eye: Secondary | ICD-10-CM

## 2023-07-04 DIAGNOSIS — R011 Cardiac murmur, unspecified: Secondary | ICD-10-CM | POA: Diagnosis present

## 2023-07-04 DIAGNOSIS — Z8249 Family history of ischemic heart disease and other diseases of the circulatory system: Secondary | ICD-10-CM

## 2023-07-04 DIAGNOSIS — R410 Disorientation, unspecified: Secondary | ICD-10-CM | POA: Diagnosis not present

## 2023-07-04 DIAGNOSIS — R413 Other amnesia: Secondary | ICD-10-CM | POA: Diagnosis present

## 2023-07-04 DIAGNOSIS — Z950 Presence of cardiac pacemaker: Secondary | ICD-10-CM

## 2023-07-04 DIAGNOSIS — J449 Chronic obstructive pulmonary disease, unspecified: Secondary | ICD-10-CM | POA: Diagnosis present

## 2023-07-04 DIAGNOSIS — Z7951 Long term (current) use of inhaled steroids: Secondary | ICD-10-CM

## 2023-07-04 DIAGNOSIS — Z9981 Dependence on supplemental oxygen: Secondary | ICD-10-CM

## 2023-07-04 LAB — URINALYSIS, W/ REFLEX TO CULTURE (INFECTION SUSPECTED)
Bilirubin Urine: NEGATIVE
Glucose, UA: >=500 mg/dL — AB
Hgb urine dipstick: NEGATIVE
Ketones, ur: NEGATIVE mg/dL
Leukocytes,Ua: NEGATIVE
Nitrite: NEGATIVE
Protein, ur: NEGATIVE mg/dL
Specific Gravity, Urine: 1.016 (ref 1.005–1.030)
pH: 5 (ref 5.0–8.0)

## 2023-07-04 LAB — CBC WITH DIFFERENTIAL/PLATELET
Abs Immature Granulocytes: 0.51 10*3/uL — ABNORMAL HIGH (ref 0.00–0.07)
Basophils Absolute: 0 10*3/uL (ref 0.0–0.1)
Basophils Relative: 0 %
Eosinophils Absolute: 0 10*3/uL (ref 0.0–0.5)
Eosinophils Relative: 0 %
HCT: 40.6 % (ref 39.0–52.0)
Hemoglobin: 13.5 g/dL (ref 13.0–17.0)
Immature Granulocytes: 3 %
Lymphocytes Relative: 6 %
Lymphs Abs: 1.2 10*3/uL (ref 0.7–4.0)
MCH: 32 pg (ref 26.0–34.0)
MCHC: 33.3 g/dL (ref 30.0–36.0)
MCV: 96.2 fL (ref 80.0–100.0)
Monocytes Absolute: 1 10*3/uL (ref 0.1–1.0)
Monocytes Relative: 5 %
Neutro Abs: 17.6 10*3/uL — ABNORMAL HIGH (ref 1.7–7.7)
Neutrophils Relative %: 86 %
Platelets: 154 10*3/uL (ref 150–400)
RBC: 4.22 MIL/uL (ref 4.22–5.81)
RDW: 14.8 % (ref 11.5–15.5)
WBC: 20.3 10*3/uL — ABNORMAL HIGH (ref 4.0–10.5)
nRBC: 0 % (ref 0.0–0.2)

## 2023-07-04 LAB — COMPREHENSIVE METABOLIC PANEL
ALT: 19 U/L (ref 0–44)
AST: 28 U/L (ref 15–41)
Albumin: 3.4 g/dL — ABNORMAL LOW (ref 3.5–5.0)
Alkaline Phosphatase: 49 U/L (ref 38–126)
Anion gap: 12 (ref 5–15)
BUN: 22 mg/dL (ref 8–23)
CO2: 25 mmol/L (ref 22–32)
Calcium: 8.4 mg/dL — ABNORMAL LOW (ref 8.9–10.3)
Chloride: 101 mmol/L (ref 98–111)
Creatinine, Ser: 1.26 mg/dL — ABNORMAL HIGH (ref 0.61–1.24)
GFR, Estimated: 57 mL/min — ABNORMAL LOW (ref >60)
Glucose, Bld: 115 mg/dL — ABNORMAL HIGH (ref 70–99)
Potassium: 4 mmol/L (ref 3.5–5.1)
Sodium: 138 mmol/L (ref 135–145)
Total Bilirubin: 1.6 mg/dL — ABNORMAL HIGH (ref 0.3–1.2)
Total Protein: 6.1 g/dL — ABNORMAL LOW (ref 6.5–8.1)

## 2023-07-04 LAB — CBC
HCT: 40.5 % (ref 39.0–52.0)
Hemoglobin: 13.5 g/dL (ref 13.0–17.0)
MCH: 32.3 pg (ref 26.0–34.0)
MCHC: 33.3 g/dL (ref 30.0–36.0)
MCV: 96.9 fL (ref 80.0–100.0)
Platelets: 151 10*3/uL (ref 150–400)
RBC: 4.18 MIL/uL — ABNORMAL LOW (ref 4.22–5.81)
RDW: 15.1 % (ref 11.5–15.5)
WBC: 18.6 10*3/uL — ABNORMAL HIGH (ref 4.0–10.5)
nRBC: 0 % (ref 0.0–0.2)

## 2023-07-04 MED ORDER — INSULIN ASPART 100 UNIT/ML IJ SOLN: 0.0000 [IU] | Freq: Every day | INTRAMUSCULAR | Status: DC

## 2023-07-04 MED ORDER — DOXYCYCLINE HYCLATE 100 MG PO TABS
100.0000 mg | ORAL_TABLET | Freq: Once | ORAL | Status: AC
  Administered 2023-07-04: 100 mg via ORAL
  Filled 2023-07-04: qty 1

## 2023-07-04 MED ORDER — SODIUM CHLORIDE 0.9 % IV SOLN
1.0000 g | Freq: Every day | INTRAVENOUS | Status: DC
  Administered 2023-07-05: 1 g via INTRAVENOUS
  Filled 2023-07-04: qty 10

## 2023-07-04 MED ORDER — ONDANSETRON HCL 4 MG/2ML IJ SOLN: 4.0000 mg | Freq: Four times a day (QID) | INTRAMUSCULAR | Status: DC | PRN

## 2023-07-04 MED ORDER — SODIUM CHLORIDE 0.9 % IV SOLN: INTRAVENOUS | Status: AC

## 2023-07-04 MED ORDER — SODIUM CHLORIDE 0.9% FLUSH
3.0000 mL | Freq: Two times a day (BID) | INTRAVENOUS | Status: DC
  Administered 2023-07-05 – 2023-07-10 (×9): 3 mL via INTRAVENOUS

## 2023-07-04 MED ORDER — ACETAMINOPHEN 325 MG PO TABS
650.0000 mg | ORAL_TABLET | Freq: Four times a day (QID) | ORAL | Status: DC | PRN
  Administered 2023-07-07: 650 mg via ORAL
  Filled 2023-07-04 (×2): qty 2

## 2023-07-04 MED ORDER — ACETAMINOPHEN 650 MG RE SUPP: 650.0000 mg | Freq: Four times a day (QID) | RECTAL | Status: DC | PRN

## 2023-07-04 MED ORDER — INSULIN ASPART 100 UNIT/ML IJ SOLN
0.0000 [IU] | Freq: Three times a day (TID) | INTRAMUSCULAR | Status: DC
  Administered 2023-07-06 – 2023-07-09 (×2): 1 [IU] via SUBCUTANEOUS

## 2023-07-04 MED ORDER — LACTATED RINGERS IV BOLUS
1000.0000 mL | Freq: Once | INTRAVENOUS | Status: AC
  Administered 2023-07-04: 1000 mL via INTRAVENOUS

## 2023-07-04 MED ORDER — ONDANSETRON HCL 4 MG PO TABS: 4.0000 mg | ORAL_TABLET | Freq: Four times a day (QID) | ORAL | Status: DC | PRN

## 2023-07-04 MED ORDER — ENOXAPARIN SODIUM 40 MG/0.4ML IJ SOSY
40.0000 mg | PREFILLED_SYRINGE | Freq: Every day | INTRAMUSCULAR | Status: DC
  Administered 2023-07-05 – 2023-07-11 (×7): 40 mg via SUBCUTANEOUS
  Filled 2023-07-04 (×7): qty 0.4

## 2023-07-04 MED ORDER — SENNOSIDES-DOCUSATE SODIUM 8.6-50 MG PO TABS: 1.0000 | ORAL_TABLET | Freq: Every evening | ORAL | Status: DC | PRN

## 2023-07-04 NOTE — Telephone Encounter (Signed)
Initial Comment Caller states her granddad has fallen twice within past week. He fell this morning and not sure he has hit his head or not. Additional Comment xferred from office to urgent triage Translation No Nurse Assessment Nurse: Ahorlu, RN, Lajuan Lines Date/Time (Eastern Time): 07/04/2023 2:02:02 PM Confirm and document reason for call. If symptomatic, describe symptoms. ---caller states her grandfather has fallen twice over the last week had a head injury was taken to the emergency department discharged home new onset confusion and difficulty ambulating which began today Does the patient have any new or worsening symptoms? ---Yes Will a triage be completed? ---Yes Related visit to physician within the last 2 weeks? ---Yes Does the PT have any chronic conditions? (i.e. diabetes, asthma, this includes High risk factors for pregnancy, etc.) ---Yes List chronic conditions. ---oxygen dependent diabetes hypertension pacemaker Is this a behavioral health or substance abuse call? ---No Guidelines Guideline Title Affirmed Question Affirmed Notes Nurse Date/Time (Eastern Time) Confusion - Delirium [1] Difficult to awaken or acting confused (e.g., disoriented, slurred speech) AND [2] present now AND [3] new-onset Ahorlu, RN, Lajuan Lines 07/04/2023 2:05:02 PM PLEASE NOTE: All timestamps contained within this report are represented as [place] Standard Time. CONFIDENTIALTY NOTICE: This fax transmission is intended only for the addressee. It contains information that is legally privileged, confidential or otherwise protected from use or disclosure. If you are not the intended recipient, you are strictly prohibited from reviewing, disclosing, copying using or disseminating any of this information or taking any action in reliance on or regarding this information. If you have received this fax in error, please notify us immediately by telephone so that we can arrange for its return to [place].  Phone: [phone], Toll-Free: [phone], Fax: [phone] Page: 2 of 2 Call Id: [id number] Disp. Time Lamount Cohen Time) Disposition Final User 07/04/2023 1:56:08 PM Send to Urgent Anna Genre 07/04/2023 2:06:28 PM Call EMS 911 Now Yes Ahorlu, RN, Lajuan Lines 07/04/2023 2:07:37 PM 911 Outcome Documentation Ahorlu, RN, Lajuan Lines Reason: caller agrees to call 911 Final Disposition 07/04/2023 2:06:28 PM Call EMS 911 Now Yes Ahorlu, RN, Lajuan Lines Caller Disagree/Comply Comply Caller Understands Yes PreDisposition 911 Care Advice Given Per Guideline CALL EMS 911 NOW: * Immediate medical attention is needed. You need to hang up and call 911 (or an ambulance). CARE ADVICE given per Confusion - Delirium (Adult) guideline

## 2023-07-04 NOTE — Telephone Encounter (Signed)
Bradley [name]- can you call grand-daughter back and get triage on the line? I'm unsure if he should be seen here or at ED. Thank you,

## 2023-07-04 NOTE — ED Provider Notes (Signed)
[place] EMERGENCY DEPARTMENT AT [facility] Provider Note   CSN: [id number] Arrival date & time: 07/04/23  1549     History  Chief Complaint  Patient presents with   Bradley Heck Sr. is a 82 y.o. male.   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  Patient himself does not remember the fall.  He was within his TV room when he fell.  He does think he hit his head.  Unclear loss of consciousness.  He has been ambulatory since the incident.  Patient denies any current headache, vision changes, neck pain, chest pain, shortness of breath, abdominal pain, extremity pain.  No recent fevers or sick symptoms.  No recent changes in medications.  Denies any blood thinners.     Home Medications Prior to Admission medications   Medication Sig Start Date End Date Taking? Authorizing Provider  acetaminophen (TYLENOL) 500 MG tablet Take 1,000 mg by mouth every 8 (eight) hours as needed for headache or moderate pain.    [provider]  albuterol (VENTOLIN HFA) 108 (90 Base) MCG/ACT inhaler Inhale 2 puffs into the lungs every 6 (six) hours as needed for wheezing or shortness of breath. 03/11/21   Ghimire, Werner Lean, MD  amLODipine (NORVASC) 10 MG tablet Take 1 tablet (10 mg total) by mouth daily. 05/30/23   [name], MD  aspirin 81 MG tablet Take 81 mg by mouth every morning.     [provider]  atorvastatin (LIPITOR) 40 MG tablet Take 1 tablet (40 mg total) by mouth at bedtime. 10/21/22   Alver Sorrow, NP  bisoprolol (ZEBETA) 10 MG tablet TAKE 1 TABLET DAILY 05/09/23   [name], MD  budesonide (PULMICORT) 0.5 MG/2ML nebulizer solution Take 2 mLs (0.5 mg total) by nebulization 2 (two) times daily. 08/02/22   [name], MD  calcium-vitamin D (OSCAL WITH D) 500-200 MG-UNIT tablet Take 1 tablet by mouth every morning.    [provider]   cholecalciferol (VITAMIN D3) 25 MCG (1000 UNIT) tablet Take 1,000 Units by mouth every evening.    [provider]  dapagliflozin propanediol (FARXIGA) 10 MG TABS tablet Take 1 tablet (10 mg total) by mouth daily before breakfast. 12/29/22   [name], MD  diphenhydramine-acetaminophen (TYLENOL PM) 25-500 MG TABS tablet Take 1 tablet by mouth at bedtime as needed (sleep).    [provider]  furosemide (LASIX) 40 MG tablet TAKE 2 TABLETS EVERY MORNING AND 1 TABLET LATE IN THE AFTERNOON 02/21/23   [name], MD  glucose blood (ONETOUCH VERIO) test strip USE TO CHECK BLOOD SUGAR NO MORE THAN TWICE A DAY 03/28/23   [name], MD  guaiFENesin (MUCINEX) 600 MG 12 hr tablet Take 600 mg by mouth 2 (two) times daily.    [provider]  ipratropium-albuterol (DUONEB) 0.5-2.5 (3) MG/3ML SOLN Take 3 mLs by nebulization every 6 (six) hours as needed. Patient taking differently: Take 3 mLs by nebulization in the morning and at bedtime. 08/02/22   [name], MD  Lancets [facility] ULTRASOFT) lancets Check blood sugars no more than twice daily 03/08/17   [name], MD  Multiple Vitamins-Minerals (MULTIVITAMINS THER. W/MINERALS) TABS tablet Take 1 tablet by mouth daily. 09/14/13   [name], MD  nitroGLYCERIN (NITROSTAT) 0.4 MG SL tablet Place 1 tablet (0.4 mg total)  under the tongue every 5 (five) minutes x 3 doses as needed for chest pain. 09/07/21   [name], MD  OXYGEN Inhale 4 L/min into the lungs continuous.    [provider]  pantoprazole (PROTONIX) 40 MG tablet Take 1 tablet (40 mg total) by mouth daily before breakfast. 05/30/23   [name], MD  tamsulosin (FLOMAX) 0.4 MG CAPS capsule Take 1 capsule (0.4 mg total) by mouth daily after supper. 05/23/23   [name], MD  zolpidem (AMBIEN) 10 MG tablet Take 10 mg by mouth at bedtime as needed for sleep. Patient not taking: Reported on 06/20/2023    [provider]      Allergies    Hydrocodone,  Ultram [tramadol], Cialis [tadalafil], and Avelox [moxifloxacin hydrochloride]    Review of Systems   Review of Systems  Physical Exam Updated Vital Signs BP 126/84   Pulse 70   Temp 98.6 F (37 C) (Oral)   Resp 20   Ht 5\' 9"  (1.753 m)   Wt 83.9 kg   SpO2 96%   BMI 27.32 kg/m  Physical Exam Constitutional:      General: He is not in acute distress.    Appearance: He is not ill-appearing.  HENT:     Head: Normocephalic.     Comments: Healing laceration to left scalp.  No new palpable skull fractures, postauricular bruising, lacerations    Right Ear: External ear normal.     Left Ear: External ear normal.     Nose: Nose normal.     Mouth/Throat:     Mouth: Mucous membranes are moist.     Pharynx: Oropharynx is clear.  Eyes:     Extraocular Movements: Extraocular movements intact.     Conjunctiva/sclera: Conjunctivae normal.     Pupils: Pupils are equal, round, and reactive to light.  Cardiovascular:     Rate and Rhythm: Normal rate.     Pulses: Normal pulses.     Heart sounds: Murmur heard.     Comments: Systolic murmur Pulmonary:     Effort: Pulmonary effort is normal. No respiratory distress.     Breath sounds: No wheezing.     Comments: 2.5L [place] Abdominal:     General: Abdomen is flat.     Palpations: Abdomen is soft.     Tenderness: There is no abdominal tenderness. There is no guarding or rebound.  Musculoskeletal:        General: Normal range of motion.     Cervical back: Normal range of motion. No rigidity or tenderness.     Right lower leg: No edema.     Left lower leg: No edema.  Skin:    General: Skin is warm and dry.     Findings: Lesion present.     Comments: Open lesion to right anterior shin with surrounding erythema and warmth  Neurological:     General: No focal deficit present.     Mental Status: He is alert.     Cranial Nerves: No cranial nerve deficit.     Sensory: No sensory deficit.     Motor: No weakness.     ED Results / Procedures  / Treatments   Labs (all labs ordered are listed, but only abnormal results are displayed) Labs Reviewed  CBC WITH DIFFERENTIAL/PLATELET - Abnormal; Notable for the following components:      Result Value   WBC 20.3 (*)    Neutro Abs 17.6 (*)    Abs Immature Granulocytes  0.51 (*)    All other components within normal limits  COMPREHENSIVE METABOLIC PANEL - Abnormal; Notable for the following components:   Glucose, Bld 115 (*)    Creatinine, Ser 1.26 (*)    Calcium 8.4 (*)    Total Protein 6.1 (*)    Albumin 3.4 (*)    Total Bilirubin 1.6 (*)    GFR, Estimated 57 (*)    All other components within normal limits  URINALYSIS, W/ REFLEX TO CULTURE (INFECTION SUSPECTED) - Abnormal; Notable for the following components:   Color, Urine AMBER (*)    APPearance HAZY (*)    Glucose, UA >=500 (*)    Bacteria, UA RARE (*)    All other components within normal limits    EKG None  Radiology CT Cervical Spine Wo Contrast  Result Date: 07/04/2023 CLINICAL DATA:  Status post fall. EXAM: CT CERVICAL SPINE WITHOUT CONTRAST TECHNIQUE: Multidetector CT imaging of the cervical spine was performed without intravenous contrast. Multiplanar CT image reconstructions were also generated. RADIATION DOSE REDUCTION: This exam was performed according to the departmental dose-optimization program which includes automated exposure control, adjustment of the mA and/or kV according to patient size and/or use of iterative reconstruction technique. COMPARISON:  None Available. FINDINGS: Alignment: There is reversal of the normal cervical spine lordosis. Skull base and vertebrae: No acute fracture. No primary bone lesion or focal pathologic process. Soft tissues and spinal canal: No prevertebral fluid or swelling. No visible canal hematoma. Disc levels: Moderate severity endplate sclerosis, mild anterior osteophyte formation and mild posterior bony spurring are seen at the levels of C5-C6 and C6-C7. Mild anterior  osteophyte formation is also seen at the level of C4-C5. Marked severity intervertebral disc space narrowing is seen at C3-C4, C5-C6 and C6-C7 with moderate severity intervertebral disc space narrowing at C4-C5. Bilateral moderate to marked severity multilevel facet joint hypertrophy is noted. Upper chest: Negative. Other: None. IMPRESSION: 1. No acute cervical spine fracture. 2. Moderate to marked severity multilevel degenerative changes, as described above. Electronically Signed   By: Aram Candela M.D.   On: 07/04/2023 20:17   CT Head Wo Contrast  Result Date: 07/04/2023 CLINICAL DATA:  Status post trauma. EXAM: CT HEAD WITHOUT CONTRAST TECHNIQUE: Contiguous axial images were obtained from the base of the skull through the vertex without intravenous contrast. RADIATION DOSE REDUCTION: This exam was performed according to the departmental dose-optimization program which includes automated exposure control, adjustment of the mA and/or kV according to patient size and/or use of iterative reconstruction technique. COMPARISON:  June 28, 2023 FINDINGS: Brain: There is moderate severity cerebral atrophy with widening of the extra-axial spaces and ventricular dilatation. There are areas of decreased attenuation within the white matter tracts of the supratentorial brain, consistent with microvascular disease changes. 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 Vascular: No hyperdense vessel or unexpected calcification. Skull: Normal. Negative for fracture or focal lesion. Sinuses/Orbits: There is marked severity left maxillary sinus mucosal thickening. Other: None. IMPRESSION: 1. Generalized cerebral atrophy with chronic white matter small vessel ischemic changes. 2. Chronic right parieto-occipital infarct. 3. No acute intracranial abnormality. 4. Marked severity left maxillary sinus disease. Electronically Signed   By: Aram Candela M.D.   On: 07/04/2023 20:14   DG Pelvis Portable  Result  Date: 07/04/2023 CLINICAL DATA:  Witnessed fall.  Weakness. EXAM: PORTABLE PELVIS 1-2 VIEWS COMPARISON:  None Available. FINDINGS: The cortical margins of the bony pelvis are intact. No fracture. Pubic symphysis and sacroiliac joints are congruent. Both femoral  heads are well-seated in the respective acetabula. IMPRESSION: No pelvic fracture. Electronically Signed   By: Narda Rutherford M.D.   On: 07/04/2023 17:22   DG Chest Portable 1 View  Result Date: 07/04/2023 CLINICAL DATA:  Fall and weakness. 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 Stable cardiomegaly. Unchanged mediastinal contours, aortic atherosclerosis. Chronic scarring in the lung bases. There is some increase in interstitial opacity in the mid lower lung zones. No confluent airspace disease. No pneumothorax or large pleural effusion. On limited assessment, no acute osseous findings. Bilateral shoulder arthropathy. IMPRESSION: 1. No evidence of traumatic injury. 2. Cardiomegaly. Increase in interstitial opacity in the mid lower lung zones may represent pulmonary edema. Electronically Signed   By: Narda Rutherford M.D.   On: 07/04/2023 17:21    Procedures Procedures    Medications Ordered in ED Medications  lactated ringers bolus 1,000 mL (0 mLs Intravenous Stopped 07/04/23 2210)  doxycycline (VIBRA-TABS) tablet 100 mg (100 mg Oral Given 07/04/23 2240)    ED Course/ Medical Decision Making/ A&P                                 Medical Decision Making Amount and/or Complexity of Data Reviewed Labs: ordered. Radiology: ordered.  Risk Prescription drug management. Decision regarding hospitalization.  82 year old male with past medical history and HPI as above.  Vital signs stable upon arrival with the exception of mild tachypnea 22.  Patient has a healing laceration to the left side of his scalp, but no new traumatic injuries visualized.  He has been ambulatory  since the incident.  Nontoxic-appearing.  Per triage note, patient was positive for orthostatic changes in the waiting room.  IV fluid resuscitation was provided as below workup is completed (last known EF 50 to 55% 10/03/22).  On my examination he was wearing 2.5 L nasal cannula, but his home oxygen is actually 4 L.  Patient himself cannot tell me additional details surrounding his fall.  Unclear if this represented a mechanical fall versus syncope, versus other.  Differentials considered include  anemia, cardiac arrhythmia, ACS/MI, hypoglycemia, AAA, aortic dissection, PE, subarachnoid hemorrhage, orthostatic hypotension, seizure, vasovagal reaction, vertebrobasilar insufficiency, infection.  In addition, he does have a holosystolic murmur on examination (he says he knows about this) that could represent symptomatic aortic stenosis (last echo report read no stenosis).  As for the traumatic nature of his fall, he appears well without any obvious external injuries.  However, traumatic differential includes but is not limited to: subdural hematoma, epidural hematoma, DAI, SAH, other TBI, cervical spine injury, other spinal injury, perforated viscous, internal bleeding, rib fractures, pneumothorax, hemothorax, and other life/limb threatening traumatic injuries.  Will proceed with CT imaging of his head and C-spine as well as x-rays of his chest and pelvis.  Pelvis x-ray negative for acute fracture or hip dislocation.  His chest x-ray shows cardiomegaly with left greater than right pulmonary interstitial edema.    Patient's laboratory workup reviewed.  He does have a mild AKI with a creatinine of 1.26, from a baseline of 0.7.  White blood cell count is 20.3 with neutrophilic predominance.  Urinalysis not consistent with UTI.  His elevated white count may be secondary to pneumonia as discussed above.  Alternative etiology may be cellulitis on his right lower extremity.   On reassessment, patient reports that he  was feeling improved.  However, on ambulatory  challenge, patient was unsteady on his feet, reporting that he felt weak and wobbly.  He also became symptomatically lightheaded and dizzy.  He did maintain oxygen throughout.  However, given his instability and unclear etiology for fall earlier today, feel that admission to the hospital is most appropriate for continued hydration and management.  Will begin doxycycline in the emergency department to help with both possible lower extremity cellulitis and pneumonia. Handoff given to admitting team.    Final Clinical Impression(s) / ED Diagnoses Final diagnoses:  Fall, initial encounter  Leukocytosis, unspecified type    Rx / DC Orders ED Discharge Orders     None         Lyman Speller, MD 07/04/23 2248    Shon Baton, MD 07/06/23 [phone]

## 2023-07-04 NOTE — Telephone Encounter (Signed)
Amber was called and ushered to triage line to discuss sxs further with triage nurse. Advised pt coordinator Fonda Kinder that this was [facility] and gave all necessary info before leaving Amber with her.

## 2023-07-04 NOTE — ED Notes (Signed)
AMBER, (daughter) [phone]

## 2023-07-04 NOTE — ED Notes (Signed)
Phlebotomy at bedside.

## 2023-07-04 NOTE — H&P (Signed)
History and Physical    Bradley [name] ZHY:[id number] DOB: July 30, 1941 DOA: 07/04/2023  PCP: [name], MD   Patient coming from: Home   Chief Complaint: Confusion, weakness, falls   HPI: Bradley FEEMSTER Sr. is a 82 y.o. male with medical history significant for COPD, chronic hypoxic and hypercarbic respiratory failure, chronic HFpEF, hypertension, type 2 diabetes mellitus, CAD, and atrial fibrillation status post ablation and PPM implantation who presents with intermittent confusion, generalized weakness, and multiple falls.  Patient's family was concerned due to the patient falling twice in the past week, suffering memory loss, and exhibiting intermittent confusion.  He fell today when he was attempting to sit down in a chair and missed the seat.  He was too weak to get up.  Patient reports feeling "wobbly" but denies any pain at this time.  He is somnolent, repeatedly falling asleep during the interview.  ED Course: Upon arrival to the ED, patient is found to be afebrile and saturating well on 3 L/min of supplemental oxygen with systolic blood pressure of 98 and greater.  No acute findings are noted on head CT or cervical spine CT.  Labs are most notable for creatinine 1.26 and WBC 20,300.  Patient was given a liter of normal saline and doxycycline in the ED.  Review of Systems:  ROS limited by patient's clinical condition.  Past Medical History:  Diagnosis Date   Abnormal CT scan, chest 2012   CT chest, several lymphadenopathies. Sees pulmonary   Anemia    intermittent   Arthritis    "?back" (09/19/2018)   Atrial fibrillation (HCC)    s/p AV node ablation & BiV PPM implantation 09/08/11 (op dictation pending)   BPH (benign prostatic hyperplasia)    Saw Dr [name] 2004, normal renal u/s   Bronchitis 08/26/2017   CAD (coronary artery disease)    CHF (congestive heart failure) (HCC)    Thought primarily to be non-systolic although EF down (EF 29-52% 12/2010, down to 35-40%  09/05/11), cath 2008 with no CAD, nuclear study 07/2011 showing Small area of reversibility in the distal ant/lat wall the left ventricle suspicious for ischemia/septal wall HK but felt to be low risk  (per D/C Summary 07/2011)   Chronic bronchitis (HCC)    "get it ~ q yr"   Chronic diastolic heart failure (HCC)         Chronic respiratory failure with hypoxia and hypercapnia (HCC) 09/14/2010   Followed in Pulmonary clinic/ [facility]/ [facility]  Started on 02 2lpm at discharge 09/23/11   - PFT's 10/28/2011  FEV1  1.40 (51%)  with ratio 70 and no better p B2 and DLCO 53 corrects to 101    - PFT's 10/18/2014    FEV1 1.71 (59%) with ratio 68 and no sign change p B2 and DLCO 53 corrects to 85  -  HC03   07/28/20  = 33  -  HCO3   03/17/21     = 31     Heart murmur    HLD (hyperlipidemia)    HTN (hypertension)    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   Pleural effusion 2008   S/p decortication   Pneumonia 08/02/2011   Pulmonary HTN (HCC)  per cath 2008   Type II diabetes mellitus (HCC) 1999    Past Surgical History:  Procedure Laterality Date   BI-VENTRICULAR PACEMAKER INSERTION Left 09/08/2011   Procedure: BI-VENTRICULAR PACEMAKER INSERTION (CRT-P);  Surgeon: Marinus Maw, MD;  Location: [facility] CATH LAB;  Service: Cardiovascular;  Laterality: Left;   BIV PACEMAKER GENERATOR CHANGEOUT N/A 11/10/2022   Procedure: BIV PACEMAKER GENERATOR CHANGEOUT;  Surgeon: Marinus Maw, MD;  Location: [facility] INVASIVE CV LAB;  Service: Cardiovascular;  Laterality: N/A;   CARDIAC CATHETERIZATION     "couple times; never had balloon or stent" (09/19/2018)   CATARACT EXTRACTION W/ INTRAOCULAR LENS  IMPLANT, BILATERAL Bilateral 08/2018   COLONOSCOPY  03/10/11   normal   INSERT / REPLACE / REMOVE PACEMAKER  09/08/11   pacemaker  placement   LUNG DECORTICATION     PLEURAL SCARIFICATION     pneumothorax with fibrothorax  ~ 2010   RIGHT/LEFT HEART CATH AND CORONARY ANGIOGRAPHY N/A 10/05/2022   Procedure: RIGHT/LEFT HEART CATH AND CORONARY ANGIOGRAPHY;  Surgeon: Corky Crafts, MD;  Location: [facility] INVASIVE CV LAB;  Service: Cardiovascular;  Laterality: N/A;   TONSILLECTOMY     "as a kid"     Social History:   reports that he quit smoking about 42 years ago. His smoking use included cigarettes and cigars. He started smoking about 67 years ago. He has a 62.5 pack-year smoking history. He has never used smokeless tobacco. He reports that he does not currently use alcohol. He reports that he does not use drugs.  Allergies  Allergen Reactions   Hydrocodone Other (See Comments)    "given to him in the hospital; went thru withdrawals once home; dr said not to take it again" (09/27/2012) dizziness   Ultram [Tramadol] Other (See Comments)    "given to him in the hospital; went thru withdrawals once home; dr said not to take it again" (09/27/2012) dizziness   Cialis [Tadalafil] Other (See Comments)    Headache Backache  Dizziness    Avelox [Moxifloxacin Hydrochloride] Itching and Other (See Comments)    Headache Dizziness    Family History  Problem Relation Age of Onset   Diabetes Father    Coronary artery disease Father    Polycythemia Mother    Drug abuse Son    Breast cancer Maternal Aunt    Prostate cancer Neg Hx    Colon cancer Neg Hx      Prior to Admission medications   Medication Sig Start Date End Date Taking? Authorizing Provider  acetaminophen (TYLENOL) 500 MG tablet Take 1,000 mg by mouth every 8 (eight) hours as needed for headache or moderate pain.    [provider]  albuterol (VENTOLIN HFA) 108 (90 Base) MCG/ACT inhaler Inhale 2 puffs into the lungs every 6 (six) hours as needed for wheezing or shortness of breath. 03/11/21   Ghimire, Werner Lean, MD  amLODipine (NORVASC) 10 MG tablet  Take 1 tablet (10 mg total) by mouth daily. 05/30/23   [name], MD  aspirin 81 MG tablet Take 81 mg by mouth every morning.     [provider]  atorvastatin (LIPITOR) 40 MG tablet Take 1 tablet (40 mg total) by mouth at bedtime. 10/21/22   Alver Sorrow, NP  bisoprolol (ZEBETA) 10 MG tablet TAKE 1 TABLET DAILY 05/09/23   [name], MD  budesonide (PULMICORT) 0.5 MG/2ML nebulizer solution Take 2 mLs (0.5 mg total) by nebulization 2 (two) times daily. 08/02/22   [name]  E, MD  calcium-vitamin D (OSCAL WITH D) 500-200 MG-UNIT tablet Take 1 tablet by mouth every morning.    [provider]  cholecalciferol (VITAMIN D3) 25 MCG (1000 UNIT) tablet Take 1,000 Units by mouth every evening.    [provider]  dapagliflozin propanediol (FARXIGA) 10 MG TABS tablet Take 1 tablet (10 mg total) by mouth daily before breakfast. 12/29/22   [name], MD  diphenhydramine-acetaminophen (TYLENOL PM) 25-500 MG TABS tablet Take 1 tablet by mouth at bedtime as needed (sleep).    [provider]  furosemide (LASIX) 40 MG tablet TAKE 2 TABLETS EVERY MORNING AND 1 TABLET LATE IN THE AFTERNOON 02/21/23   [name], MD  glucose blood (ONETOUCH VERIO) test strip USE TO CHECK BLOOD SUGAR NO MORE THAN TWICE A DAY 03/28/23   [name], MD  guaiFENesin (MUCINEX) 600 MG 12 hr tablet Take 600 mg by mouth 2 (two) times daily.    [provider]  ipratropium-albuterol (DUONEB) 0.5-2.5 (3) MG/3ML SOLN Take 3 mLs by nebulization every 6 (six) hours as needed. Patient taking differently: Take 3 mLs by nebulization in the morning and at bedtime. 08/02/22   [name], MD  Lancets [facility] ULTRASOFT) lancets Check blood sugars no more than twice daily 03/08/17   [name], MD  Multiple Vitamins-Minerals (MULTIVITAMINS THER. W/MINERALS) TABS tablet Take 1 tablet by mouth daily. 09/14/13   [name], MD  nitroGLYCERIN (NITROSTAT) 0.4 MG SL tablet Place 1 tablet (0.4 mg total)  under the tongue every 5 (five) minutes x 3 doses as needed for chest pain. 09/07/21   [name], MD  OXYGEN Inhale 4 L/min into the lungs continuous.    [provider]  pantoprazole (PROTONIX) 40 MG tablet Take 1 tablet (40 mg total) by mouth daily before breakfast. 05/30/23   [name], MD  tamsulosin (FLOMAX) 0.4 MG CAPS capsule Take 1 capsule (0.4 mg total) by mouth daily after supper. 05/23/23   [name], MD  zolpidem (AMBIEN) 10 MG tablet Take 10 mg by mouth at bedtime as needed for sleep. Patient not taking: Reported on 06/20/2023    [provider]    Physical Exam: Vitals:   07/04/23 2131 07/04/23 2132 07/04/23 2133 07/04/23 2212  BP:  126/84    Pulse:    70  Resp: 20     Temp:      TempSrc:      SpO2:   100% 96%  Weight:      Height:        Constitutional: NAD, no pallor or diaphoresis  Eyes: PERTLA, lids and conjunctivae normal ENMT: Mucous membranes are moist. Posterior pharynx clear of any exudate or lesions.   Neck: supple, no masses  Respiratory: no wheezing, no crackles. No accessory muscle use.  Cardiovascular: S1 & S2 heard, regular rate and rhythm. No JVD.  Abdomen: No distension, no tenderness, soft. Bowel sounds active.  Musculoskeletal: no clubbing / cyanosis. No joint deformity upper and lower extremities.  No meningismus.  Skin:  Heat, erythema, and tenderness involving lower RLE. Warm, dry, well-perfused. Neurologic: CN 2-12 grossly intact. Sensation to light touch intact. Strength 5/5 in all 4 limbs. Sleeping, wakes to voice and oriented to person and place.  Psychiatric: Calm. Cooperative.    Labs and Imaging on Admission: I have personally reviewed following labs and imaging studies  CBC: Recent Labs  Lab 07/04/23 1630  WBC 20.3*  NEUTROABS 17.6*  HGB  13.5  HCT 40.6  MCV 96.2  PLT 154   Basic Metabolic Panel: Recent Labs  Lab 07/04/23 1630  NA 138  K 4.0  CL 101  CO2 25  GLUCOSE 115*  BUN 22  CREATININE 1.26*   CALCIUM 8.4*   GFR: Estimated Creatinine Clearance: 45.2 mL/min (A) (by C-G formula based on SCr of 1.26 mg/dL (H)). Liver Function Tests: Recent Labs  Lab 07/04/23 1630  AST 28  ALT 19  ALKPHOS 49  BILITOT 1.6*  PROT 6.1*  ALBUMIN 3.4*   No results for input(s): "LIPASE", "AMYLASE" in the last 168 hours. No results for input(s): "AMMONIA" in the last 168 hours. Coagulation Profile: No results for input(s): "INR", "PROTIME" in the last 168 hours. Cardiac Enzymes: No results for input(s): "CKTOTAL", "CKMB", "CKMBINDEX", "TROPONINI" in the last 168 hours. BNP (last 3 results) No results for input(s): "PROBNP" in the last 8760 hours. HbA1C: No results for input(s): "HGBA1C" in the last 72 hours. CBG: No results for input(s): "GLUCAP" in the last 168 hours. Lipid Profile: No results for input(s): "CHOL", "HDL", "LDLCALC", "TRIG", "CHOLHDL", "LDLDIRECT" in the last 72 hours. Thyroid Function Tests: No results for input(s): "TSH", "T4TOTAL", "FREET4", "T3FREE", "THYROIDAB" in the last 72 hours. Anemia Panel: No results for input(s): "VITAMINB12", "FOLATE", "FERRITIN", "TIBC", "IRON", "RETICCTPCT" in the last 72 hours. Urine analysis:    Component Value Date/Time   COLORURINE AMBER (A) 07/04/2023 1749   APPEARANCEUR HAZY (A) 07/04/2023 1749   LABSPEC 1.016 07/04/2023 1749   PHURINE 5.0 07/04/2023 1749   GLUCOSEU >=500 (A) 07/04/2023 1749   GLUCOSEU NEGATIVE 07/15/2015 1440   HGBUR NEGATIVE 07/04/2023 1749   BILIRUBINUR NEGATIVE 07/04/2023 1749   KETONESUR NEGATIVE 07/04/2023 1749   PROTEINUR NEGATIVE 07/04/2023 1749   UROBILINOGEN 1.0 07/15/2015 1440   NITRITE NEGATIVE 07/04/2023 1749   LEUKOCYTESUR NEGATIVE 07/04/2023 1749   Sepsis Labs: @LABRCNTIP (procalcitonin:4,lacticidven:4) )No results found for this or any previous visit (from the past 240 hour(s)).   Radiological Exams on Admission: CT Cervical Spine Wo Contrast  Result Date: 07/04/2023 CLINICAL DATA:   Status post fall. EXAM: CT CERVICAL SPINE WITHOUT CONTRAST TECHNIQUE: Multidetector CT imaging of the cervical spine was performed without intravenous contrast. Multiplanar CT image reconstructions were also generated. RADIATION DOSE REDUCTION: This exam was performed according to the departmental dose-optimization program which includes automated exposure control, adjustment of the mA and/or kV according to patient size and/or use of iterative reconstruction technique. COMPARISON:  None Available. FINDINGS: Alignment: There is reversal of the normal cervical spine lordosis. Skull base and vertebrae: No acute fracture. No primary bone lesion or focal pathologic process. Soft tissues and spinal canal: No prevertebral fluid or swelling. No visible canal hematoma. Disc levels: Moderate severity endplate sclerosis, mild anterior osteophyte formation and mild posterior bony spurring are seen at the levels of C5-C6 and C6-C7. Mild anterior osteophyte formation is also seen at the level of C4-C5. Marked severity intervertebral disc space narrowing is seen at C3-C4, C5-C6 and C6-C7 with moderate severity intervertebral disc space narrowing at C4-C5. Bilateral moderate to marked severity multilevel facet joint hypertrophy is noted. Upper chest: Negative. Other: None. IMPRESSION: 1. No acute cervical spine fracture. 2. Moderate to marked severity multilevel degenerative changes, as described above. Electronically Signed   By: Aram Candela M.D.   On: 07/04/2023 20:17   CT Head Wo Contrast  Result Date: 07/04/2023 CLINICAL DATA:  Status post trauma. EXAM: CT HEAD WITHOUT CONTRAST TECHNIQUE: Contiguous axial images were obtained from the base  of the skull through the vertex without intravenous contrast. RADIATION DOSE REDUCTION: This exam was performed according to the departmental dose-optimization program which includes automated exposure control, adjustment of the mA and/or kV according to patient size and/or use of  iterative reconstruction technique. COMPARISON:  June 28, 2023 FINDINGS: Brain: There is moderate severity cerebral atrophy with widening of the extra-axial spaces and ventricular dilatation. There are areas of decreased attenuation within the white matter tracts of the supratentorial brain, consistent with microvascular disease changes. 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 Vascular: No hyperdense vessel or unexpected calcification. Skull: Normal. Negative for fracture or focal lesion. Sinuses/Orbits: There is marked severity left maxillary sinus mucosal thickening. Other: None. IMPRESSION: 1. Generalized cerebral atrophy with chronic white matter small vessel ischemic changes. 2. Chronic right parieto-occipital infarct. 3. No acute intracranial abnormality. 4. Marked severity left maxillary sinus disease. Electronically Signed   By: Aram Candela M.D.   On: 07/04/2023 20:14   DG Pelvis Portable  Result Date: 07/04/2023 CLINICAL DATA:  Witnessed fall.  Weakness. EXAM: PORTABLE PELVIS 1-2 VIEWS COMPARISON:  None Available. FINDINGS: The cortical margins of the bony pelvis are intact. No fracture. Pubic symphysis and sacroiliac joints are congruent. Both femoral heads are well-seated in the respective acetabula. IMPRESSION: No pelvic fracture. Electronically Signed   By: Narda Rutherford M.D.   On: 07/04/2023 17:22   DG Chest Portable 1 View  Result Date: 07/04/2023 CLINICAL DATA:  Fall and weakness. 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 Stable cardiomegaly. Unchanged mediastinal contours, aortic atherosclerosis. Chronic scarring in the lung bases. There is some increase in interstitial opacity in the mid lower lung zones. No confluent airspace disease. No pneumothorax or large pleural effusion. On limited assessment, no acute osseous findings. Bilateral shoulder arthropathy. IMPRESSION: 1. No evidence of traumatic  injury. 2. Cardiomegaly. Increase in interstitial opacity in the mid lower lung zones may represent pulmonary edema. Electronically Signed   By: Narda Rutherford M.D.   On: 07/04/2023 17:21    EKG: Independently reviewed. Paced rhythm.   Assessment/Plan   1. Acute encephalopathy  - Presents with somnolence, general weakness, and intermittent confusion  - No acute findings on head CT  - Possibly from AKI, dehydration, and/or infection  - Check blood gas, ammonia, TSH, mag, and phos, continue IVF hydration, treat cellulitis, use delirium precautions    2. AKI  - SCr is 1.26, up from 0.79 last month  - Hold Lasix and Farxiga, continue IVF hydration, renally-dose medications, repeat chem panel in am    3. RLE cellulitis  - Treat with Rocephin, consider advanced imaging if worsens or fails to improve   4. CAD  - No anginal symptoms  - Continue ASA and Lipitor    5. COPD; chronic respiratory failure  - Not in exacerbation  - Continue supplemental O2, nebs    6. Hypertension  - SBP 90s in ED and antihypertensives held on admission   7. Type II DM  - A1c was 6.3% in August 2024  - Check CBGs and use low-intensity SSI if needed    DVT prophylaxis: Lovenox  Code Status: Full  Level of Care: Level of care: Telemetry Medical Family Communication: Family updated from ED  Disposition Plan:  Patient is from: home  Anticipated d/c is to: TBD Anticipated d/c date is: 9/24 or 07/06/23  Patient currently: Pending AMS workup, PT eval, improved renal function  Consults called: None  Admission  status: Observation     Bradley Deutscher, MD [facility]  07/04/2023, 11:01 PM

## 2023-07-04 NOTE — ED Triage Notes (Signed)
PT BIB [facility] from home after granddaughter stated that he had a witnessed fall this am with no LOC.Since the fall, the granddaughter states he has had intermittent periods of confusion, increased weakness but upon pickup was A+OX4. PT is not on thinners, has COPD, 4L OF O2 on baseline and a paced rhythm.CBG:154 and positive for orthostatic changes. Cap::28/29.PT does have a strong urine smell and was given 200L NS before arrival.

## 2023-07-04 NOTE — Telephone Encounter (Signed)
FYI. Pt in ED.  ?

## 2023-07-04 NOTE — Telephone Encounter (Signed)
Amber, granddaughter, called to advise her grandfather is not remembering stuff and he has fallen twice in the last week. Pt was taken to be evaluated and CT came back okay. He fell this morning but she found him sitting upright so she isn't sure that he hit his head. It was as if the patient missed the chair and couldn't get back up. She said he just couldn't use his legs to stand up. Wasn't triaged since pt was evaluated for the prior fall and did not appear to have hit his head today. She said "it's like his soul is missing from his eyes". Granddaughter is very concerned and said she knows that he values dr Drue Novel opinion so she needs guidance on what to do for him. [phone]

## 2023-07-04 NOTE — ED Notes (Signed)
Giovany Dehoff [facility] [phone] would like an update asap

## 2023-07-05 DIAGNOSIS — I5032 Chronic diastolic (congestive) heart failure: Secondary | ICD-10-CM | POA: Diagnosis present

## 2023-07-05 DIAGNOSIS — R5381 Other malaise: Secondary | ICD-10-CM | POA: Diagnosis present

## 2023-07-05 DIAGNOSIS — E876 Hypokalemia: Secondary | ICD-10-CM | POA: Diagnosis present

## 2023-07-05 DIAGNOSIS — R0789 Other chest pain: Secondary | ICD-10-CM | POA: Diagnosis not present

## 2023-07-05 DIAGNOSIS — G9341 Metabolic encephalopathy: Secondary | ICD-10-CM | POA: Diagnosis present

## 2023-07-05 DIAGNOSIS — N179 Acute kidney failure, unspecified: Secondary | ICD-10-CM | POA: Diagnosis present

## 2023-07-05 DIAGNOSIS — E1151 Type 2 diabetes mellitus with diabetic peripheral angiopathy without gangrene: Secondary | ICD-10-CM | POA: Diagnosis present

## 2023-07-05 DIAGNOSIS — Z8249 Family history of ischemic heart disease and other diseases of the circulatory system: Secondary | ICD-10-CM | POA: Diagnosis not present

## 2023-07-05 DIAGNOSIS — L03115 Cellulitis of right lower limb: Secondary | ICD-10-CM | POA: Diagnosis present

## 2023-07-05 DIAGNOSIS — R410 Disorientation, unspecified: Secondary | ICD-10-CM | POA: Diagnosis present

## 2023-07-05 DIAGNOSIS — J9611 Chronic respiratory failure with hypoxia: Secondary | ICD-10-CM | POA: Diagnosis present

## 2023-07-05 DIAGNOSIS — E785 Hyperlipidemia, unspecified: Secondary | ICD-10-CM | POA: Diagnosis present

## 2023-07-05 DIAGNOSIS — R011 Cardiac murmur, unspecified: Secondary | ICD-10-CM | POA: Diagnosis present

## 2023-07-05 DIAGNOSIS — J9612 Chronic respiratory failure with hypercapnia: Secondary | ICD-10-CM | POA: Diagnosis present

## 2023-07-05 DIAGNOSIS — Z950 Presence of cardiac pacemaker: Secondary | ICD-10-CM | POA: Diagnosis not present

## 2023-07-05 DIAGNOSIS — I4891 Unspecified atrial fibrillation: Secondary | ICD-10-CM | POA: Diagnosis present

## 2023-07-05 DIAGNOSIS — A419 Sepsis, unspecified organism: Secondary | ICD-10-CM | POA: Diagnosis present

## 2023-07-05 DIAGNOSIS — I2510 Atherosclerotic heart disease of native coronary artery without angina pectoris: Secondary | ICD-10-CM | POA: Diagnosis present

## 2023-07-05 DIAGNOSIS — N400 Enlarged prostate without lower urinary tract symptoms: Secondary | ICD-10-CM | POA: Diagnosis present

## 2023-07-05 DIAGNOSIS — W1839XA Other fall on same level, initial encounter: Secondary | ICD-10-CM | POA: Diagnosis present

## 2023-07-05 DIAGNOSIS — R079 Chest pain, unspecified: Secondary | ICD-10-CM | POA: Diagnosis not present

## 2023-07-05 DIAGNOSIS — Z9181 History of falling: Secondary | ICD-10-CM | POA: Diagnosis not present

## 2023-07-05 DIAGNOSIS — G43909 Migraine, unspecified, not intractable, without status migrainosus: Secondary | ICD-10-CM | POA: Diagnosis present

## 2023-07-05 DIAGNOSIS — I110 Hypertensive heart disease with heart failure: Secondary | ICD-10-CM | POA: Diagnosis present

## 2023-07-05 DIAGNOSIS — Z87891 Personal history of nicotine dependence: Secondary | ICD-10-CM | POA: Diagnosis not present

## 2023-07-05 DIAGNOSIS — J4489 Other specified chronic obstructive pulmonary disease: Secondary | ICD-10-CM | POA: Diagnosis present

## 2023-07-05 DIAGNOSIS — I272 Pulmonary hypertension, unspecified: Secondary | ICD-10-CM | POA: Diagnosis present

## 2023-07-05 DIAGNOSIS — G9340 Encephalopathy, unspecified: Secondary | ICD-10-CM | POA: Diagnosis not present

## 2023-07-05 LAB — MAGNESIUM: Magnesium: 2.1 mg/dL (ref 1.7–2.4)

## 2023-07-05 LAB — GLUCOSE, CAPILLARY
Glucose-Capillary: 106 mg/dL — ABNORMAL HIGH (ref 70–99)
Glucose-Capillary: 117 mg/dL — ABNORMAL HIGH (ref 70–99)
Glucose-Capillary: 96 mg/dL (ref 70–99)

## 2023-07-05 LAB — COMPREHENSIVE METABOLIC PANEL
ALT: 18 U/L (ref 0–44)
AST: 32 U/L (ref 15–41)
Albumin: 3.3 g/dL — ABNORMAL LOW (ref 3.5–5.0)
Alkaline Phosphatase: 47 U/L (ref 38–126)
Anion gap: 9 (ref 5–15)
BUN: 23 mg/dL (ref 8–23)
CO2: 28 mmol/L (ref 22–32)
Calcium: 8.3 mg/dL — ABNORMAL LOW (ref 8.9–10.3)
Chloride: 101 mmol/L (ref 98–111)
Creatinine, Ser: 1.11 mg/dL (ref 0.61–1.24)
GFR, Estimated: >60 mL/min (ref >60)
Glucose, Bld: 103 mg/dL — ABNORMAL HIGH (ref 70–99)
Potassium: 3.4 mmol/L — ABNORMAL LOW (ref 3.5–5.1)
Sodium: 138 mmol/L (ref 135–145)
Total Bilirubin: 1.5 mg/dL — ABNORMAL HIGH (ref 0.3–1.2)
Total Protein: 6.2 g/dL — ABNORMAL LOW (ref 6.5–8.1)

## 2023-07-05 LAB — BLOOD CULTURE ID PANEL (REFLEXED) - BCID2

## 2023-07-05 LAB — PHOSPHORUS: Phosphorus: 3.1 mg/dL (ref 2.5–4.6)

## 2023-07-05 LAB — BLOOD GAS, VENOUS
Acid-Base Excess: 3.9 mmol/L — ABNORMAL HIGH (ref 0.0–2.0)
Bicarbonate: 28.5 mmol/L — ABNORMAL HIGH (ref 20.0–28.0)
O2 Saturation: 45.3 %
Patient temperature: 37
pCO2, Ven: 42 mmHg — ABNORMAL LOW (ref 44–60)
pH, Ven: 7.44 — ABNORMAL HIGH (ref 7.25–7.43)
pO2, Ven: <31 mmHg — CL (ref 32–45)

## 2023-07-05 LAB — TSH: TSH: 0.974 u[IU]/mL (ref 0.350–4.500)

## 2023-07-05 LAB — AMMONIA: Ammonia: <10 umol/L (ref 9–35)

## 2023-07-05 LAB — CBG MONITORING, ED: Glucose-Capillary: 83 mg/dL (ref 70–99)

## 2023-07-05 MED ORDER — SODIUM CHLORIDE 0.9 % IV SOLN
2.0000 g | Freq: Every day | INTRAVENOUS | Status: AC
  Administered 2023-07-05 – 2023-07-07 (×3): 2 g via INTRAVENOUS
  Filled 2023-07-05 (×3): qty 20

## 2023-07-05 MED ORDER — POTASSIUM CHLORIDE CRYS ER 20 MEQ PO TBCR
40.0000 meq | EXTENDED_RELEASE_TABLET | Freq: Once | ORAL | Status: AC
  Administered 2023-07-05: 40 meq via ORAL
  Filled 2023-07-05: qty 2

## 2023-07-05 NOTE — Progress Notes (Signed)
PHARMACY - PHYSICIAN COMMUNICATION CRITICAL VALUE ALERT - BLOOD CULTURE IDENTIFICATION (BCID)  Bradley MOODY Sr. is an 82 y.o. male who presented to [facility] on 07/04/2023 with a chief complaint of confusion and falls  Assessment:  blood cultures with GNR 3/4 bottles, BCID no results  Name of physician (or Provider) Contacted: Dr. Renford Dills  Current antibiotics: Rocephin  Changes to prescribed antibiotics recommended:  Patient is on recommended antibiotics - No changes needed  Results for orders placed or performed during the hospital encounter of 07/04/23  Blood Culture ID Panel (Reflexed) (Collected: 07/04/2023 11:23 PM)  Result Value Ref Range   Enterococcus faecalis NOT DETECTED NOT DETECTED   Enterococcus Faecium NOT DETECTED NOT DETECTED   Listeria monocytogenes NOT DETECTED NOT DETECTED   Staphylococcus species NOT DETECTED NOT DETECTED   Staphylococcus aureus (BCID) NOT DETECTED NOT DETECTED   Staphylococcus epidermidis NOT DETECTED NOT DETECTED   Staphylococcus lugdunensis NOT DETECTED NOT DETECTED   Streptococcus species NOT DETECTED NOT DETECTED   Streptococcus agalactiae NOT DETECTED NOT DETECTED   Streptococcus pneumoniae NOT DETECTED NOT DETECTED   Streptococcus pyogenes NOT DETECTED NOT DETECTED   A.calcoaceticus-baumannii NOT DETECTED NOT DETECTED   Bacteroides fragilis NOT DETECTED NOT DETECTED   Enterobacterales NOT DETECTED NOT DETECTED   Enterobacter cloacae complex NOT DETECTED NOT DETECTED   Escherichia coli NOT DETECTED NOT DETECTED   Klebsiella aerogenes NOT DETECTED NOT DETECTED   Klebsiella oxytoca NOT DETECTED NOT DETECTED   Klebsiella pneumoniae NOT DETECTED NOT DETECTED   Proteus species NOT DETECTED NOT DETECTED   Salmonella species NOT DETECTED NOT DETECTED   Serratia marcescens NOT DETECTED NOT DETECTED   Haemophilus influenzae NOT DETECTED NOT DETECTED   Neisseria meningitidis NOT DETECTED NOT DETECTED   Pseudomonas aeruginosa NOT DETECTED NOT  DETECTED   Stenotrophomonas maltophilia NOT DETECTED NOT DETECTED   Candida albicans NOT DETECTED NOT DETECTED   Candida auris NOT DETECTED NOT DETECTED   Candida glabrata NOT DETECTED NOT DETECTED   Candida krusei NOT DETECTED NOT DETECTED   Candida parapsilosis NOT DETECTED NOT DETECTED   Candida tropicalis NOT DETECTED NOT DETECTED   Cryptococcus neoformans/gattii NOT DETECTED NOT DETECTED    Harland German, PharmD Clinical Pharmacist **Pharmacist phone directory can now be found on [email or web address] ([email or web address]).  Listed under [facility].

## 2023-07-05 NOTE — ED Notes (Signed)
Patient stable. Went to floor with transport.

## 2023-07-05 NOTE — ED Notes (Signed)
ED TO INPATIENT HANDOFF REPORT  ED Nurse Name and Phone #: Alycia Rossetti [phone]   S Name/Age/Gender Bradley [name] Sr. 82 y.o. male Room/Bed: 044C/044C  Code Status   Code Status: Full Code  Home/SNF/Other Home Patient oriented to: self, place, time, and situation Is this baseline? Yes   Triage Complete: Triage complete  Chief Complaint AKI (acute kidney injury) (HCC) [N17.9]  Triage Note PT BIB [facility] from home after granddaughter stated that he had a witnessed fall this am with no LOC.Since the fall, the granddaughter states he has had intermittent periods of confusion, increased weakness but upon pickup was A+OX4. PT is not on thinners, has COPD, 4L OF O2 on baseline and a paced rhythm.CBG:154 and positive for orthostatic changes. Cap::28/29.PT does have a strong urine smell and was given 200L NS before arrival.   Allergies Allergies  Allergen Reactions   Hydrocodone Other (See Comments)    "given to him in the hospital; went thru withdrawals once home; dr said not to take it again" (09/27/2012) dizziness   Ultram [Tramadol] Other (See Comments)    "given to him in the hospital; went thru withdrawals once home; dr said not to take it again" (09/27/2012) dizziness   Cialis [Tadalafil] Other (See Comments)    Headache Backache  Dizziness    Avelox [Moxifloxacin Hydrochloride] Itching and Other (See Comments)    Headache Dizziness    Level of Care/Admitting Diagnosis ED Disposition     ED Disposition  Admit   Condition  --   Comment  Hospital Area: [facility] [[id number]]  Level of Care: Telemetry Medical [[id number]]  May place patient in observation at [facility] or [facility] if equivalent level of care is available:: Yes  Covid Evaluation: Asymptomatic - no recent exposure (last 10 days) testing not required  Diagnosis: AKI (acute kidney injury) [facility]) [[id number]]  Admitting Physician: Briscoe Deutscher [[id number]]  Attending Physician: Briscoe Deutscher  [[id number]]          B Medical/Surgery History Past Medical History:  Diagnosis Date   Abnormal CT scan, chest 2012   CT chest, several lymphadenopathies. Sees pulmonary   Anemia    intermittent   Arthritis    "?back" (09/19/2018)   Atrial fibrillation (HCC)    s/p AV node ablation & BiV PPM implantation 09/08/11 (op dictation pending)   BPH (benign prostatic hyperplasia)    Saw Dr [name] 2004, normal renal u/s   Bronchitis 08/26/2017   CAD (coronary artery disease)    CHF (congestive heart failure) (HCC)    Thought primarily to be non-systolic although EF down (EF 41-32% 12/2010, down to 35-40% 09/05/11), cath 2008 with no CAD, nuclear study 07/2011 showing Small area of reversibility in the distal ant/lat wall the left ventricle suspicious for ischemia/septal wall HK but felt to be low risk  (per D/C Summary 07/2011)   Chronic bronchitis (HCC)    "get it ~ q yr"   Chronic diastolic heart failure (HCC)         Chronic respiratory failure with hypoxia and hypercapnia (HCC) 09/14/2010   Followed in Pulmonary clinic/ [facility]/ [facility]  Started on 02 2lpm at discharge 09/23/11   - PFT's 10/28/2011  FEV1  1.40 (51%)  with ratio 70 and no better p B2 and DLCO 53 corrects to 101    - PFT's 10/18/2014    FEV1 1.71 (59%) with ratio 68 and no sign change p B2 and DLCO 53 corrects to 85  -  HC03   07/28/20  = 33  -  HCO3   03/17/21     = 31     Heart murmur    HLD (hyperlipidemia)    HTN (hypertension)    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   Pleural effusion 2008   S/p decortication   Pneumonia 08/02/2011   Pulmonary HTN (HCC)    per cath 2008   Type II diabetes mellitus (HCC) 1999   Past Surgical History:  Procedure Laterality Date   BI-VENTRICULAR PACEMAKER INSERTION Left 09/08/2011    Procedure: BI-VENTRICULAR PACEMAKER INSERTION (CRT-P);  Surgeon: Marinus Maw, MD;  Location: [facility] CATH LAB;  Service: Cardiovascular;  Laterality: Left;   BIV PACEMAKER GENERATOR CHANGEOUT N/A 11/10/2022   Procedure: BIV PACEMAKER GENERATOR CHANGEOUT;  Surgeon: Marinus Maw, MD;  Location: [facility] INVASIVE CV LAB;  Service: Cardiovascular;  Laterality: N/A;   CARDIAC CATHETERIZATION     "couple times; never had balloon or stent" (09/19/2018)   CATARACT EXTRACTION W/ INTRAOCULAR LENS  IMPLANT, BILATERAL Bilateral 08/2018   COLONOSCOPY  03/10/11   normal   INSERT / REPLACE / REMOVE PACEMAKER  09/08/11   pacemaker placement   LUNG DECORTICATION     PLEURAL SCARIFICATION     pneumothorax with fibrothorax  ~ 2010   RIGHT/LEFT HEART CATH AND CORONARY ANGIOGRAPHY N/A 10/05/2022   Procedure: RIGHT/LEFT HEART CATH AND CORONARY ANGIOGRAPHY;  Surgeon: Corky Crafts, MD;  Location: [facility] INVASIVE CV LAB;  Service: Cardiovascular;  Laterality: N/A;   TONSILLECTOMY     "as a kid"      A IV Location/Drains/Wounds Patient Lines/Drains/Airways Status     Active Line/Drains/Airways     Name Placement date Placement time Site Days   Peripheral IV 07/04/23 20 G Anterior;Right Forearm 07/04/23  1715  Forearm  1   Peripheral IV 07/04/23 20 G Right Antecubital 07/04/23  2348  Antecubital   1            Intake/Output Last 24 hours  Intake/Output Summary (Last 24 hours) at 07/05/2023 0002 Last data filed at 07/04/2023 2210 Gross per 24 hour  Intake 1000 ml  Output --  Net 1000 ml    Labs/Imaging Results for orders placed or performed during the hospital encounter of 07/04/23 (from the past 48 hour(s))  CBC with Differential     Status: Abnormal   Collection Time: 07/04/23  4:30 PM  Result Value Ref Range   WBC 20.3 (H) 4.0 - 10.5 K/uL   RBC 4.22 4.22 - 5.81 MIL/uL   Hemoglobin 13.5 13.0 - 17.0 g/dL   HCT 02.7 25.3 - 66.4 %   MCV 96.2 80.0 - 100.0 fL   MCH 32.0 26.0 - 34.0 pg   MCHC 33.3  30.0 - 36.0 g/dL   RDW 40.3 47.4 - 25.9 %   Platelets 154 150 - 400 K/uL   nRBC 0.0 0.0 - 0.2 %   Neutrophils Relative % 86 %   Neutro Abs 17.6 (H) 1.7 - 7.7 K/uL   Lymphocytes Relative 6 %   Lymphs Abs 1.2 0.7 - 4.0 K/uL   Monocytes Relative 5 %   Monocytes Absolute 1.0 0.1 - 1.0 K/uL   Eosinophils Relative 0 %   Eosinophils Absolute  0.0 0.0 - 0.5 K/uL   Basophils Relative 0 %   Basophils Absolute 0.0 0.0 - 0.1 K/uL   Immature Granulocytes 3 %   Abs Immature Granulocytes 0.51 (H) 0.00 - 0.07 K/uL    Comment: Performed at [facility], [address], [place], [place] [address]  Comprehensive metabolic panel     Status: Abnormal   Collection Time: 07/04/23  4:30 PM  Result Value Ref Range   Sodium 138 135 - 145 mmol/L   Potassium 4.0 3.5 - 5.1 mmol/L   Chloride 101 98 - 111 mmol/L   CO2 25 22 - 32 mmol/L   Glucose, Bld 115 (H) 70 - 99 mg/dL    Comment: Glucose reference range applies only to samples taken after fasting for at least 8 hours.   BUN 22 8 - 23 mg/dL   Creatinine, Ser 8.84 (H) 0.61 - 1.24 mg/dL   Calcium 8.4 (L) 8.9 - 10.3 mg/dL   Total Protein 6.1 (L) 6.5 - 8.1 g/dL   Albumin 3.4 (L) 3.5 - 5.0 g/dL   AST 28 15 - 41 U/L   ALT 19 0 - 44 U/L   Alkaline Phosphatase 49 38 - 126 U/L   Total Bilirubin 1.6 (H) 0.3 - 1.2 mg/dL   GFR, Estimated 57 (L) >60 mL/min    Comment: (NOTE) Calculated using the CKD-EPI Creatinine Equation (2021)    Anion gap 12 5 - 15    Comment: Performed at [facility], [address] [place], [place] [address]  Urinalysis, w/ Reflex to Culture (Infection Suspected) -Urine, Clean Catch     Status: Abnormal   Collection Time: 07/04/23  5:49 PM  Result Value Ref Range   Specimen Source URINE, CLEAN CATCH    Color, Urine AMBER (A) YELLOW    Comment: BIOCHEMICALS MAY BE AFFECTED BY COLOR   APPearance HAZY (A) CLEAR   Specific Gravity, Urine 1.016 1.005 - 1.030   pH 5.0 5.0 - 8.0   Glucose, UA >=500 (A) NEGATIVE mg/dL   Hgb  urine dipstick NEGATIVE NEGATIVE   Bilirubin Urine NEGATIVE NEGATIVE   Ketones, ur NEGATIVE NEGATIVE mg/dL   Protein, ur NEGATIVE NEGATIVE mg/dL   Nitrite NEGATIVE NEGATIVE   Leukocytes,Ua NEGATIVE NEGATIVE   RBC / HPF 0-5 0 - 5 RBC/hpf   WBC, UA 0-5 0 - 5 WBC/hpf    Comment:        Reflex urine culture not performed if WBC <=10, OR if Squamous epithelial cells >5. If Squamous epithelial cells >5 suggest recollection.    Bacteria, UA RARE (A) NONE SEEN   Squamous Epithelial / HPF 0-5 0 - 5 /HPF   Mucus PRESENT     Comment: Performed at [facility], [address] [place], [place] [address]  CBC     Status: Abnormal   Collection Time: 07/04/23 11:23 PM  Result Value Ref Range   WBC 18.6 (H) 4.0 - 10.5 K/uL   RBC 4.18 (L) 4.22 - 5.81 MIL/uL   Hemoglobin 13.5 13.0 - 17.0 g/dL   HCT 10.9 32.3 - 55.7 %   MCV 96.9 80.0 - 100.0 fL   MCH 32.3 26.0 - 34.0 pg   MCHC 33.3 30.0 - 36.0 g/dL   RDW 32.2 02.5 - 42.7 %   Platelets 151 150 - 400 K/uL   nRBC 0.0 0.0 - 0.2 %    Comment: Performed at [facility], [address] [place], [place] [address]  CBG monitoring, ED     Status: None   Collection Time: 07/04/23 11:58 PM  Result Value Ref Range   Glucose-Capillary 83 70 - 99 mg/dL    Comment: Glucose reference range applies only to samples taken after fasting for at least 8 hours.   *Note: Due to a large number of results and/or encounters for the requested time period, some results have not been displayed. A complete set of results can be found in Results Review.   CT Cervical Spine Wo Contrast  Result Date: 07/04/2023 CLINICAL DATA:  Status post fall. EXAM: CT CERVICAL SPINE WITHOUT CONTRAST TECHNIQUE: Multidetector CT imaging of the cervical spine was performed without intravenous contrast. Multiplanar CT image reconstructions were also generated. RADIATION DOSE REDUCTION: This exam was performed according to the departmental dose-optimization program which includes  automated exposure control, adjustment of the mA and/or kV according to patient size and/or use of iterative reconstruction technique. COMPARISON:  None Available. FINDINGS: Alignment: There is reversal of the normal cervical spine lordosis. Skull base and vertebrae: No acute fracture. No primary bone lesion or focal pathologic process. Soft tissues and spinal canal: No prevertebral fluid or swelling. No visible canal hematoma. Disc levels: Moderate severity endplate sclerosis, mild anterior osteophyte formation and mild posterior bony spurring are seen at the levels of C5-C6 and C6-C7. Mild anterior osteophyte formation is also seen at the level of C4-C5. Marked severity intervertebral disc space narrowing is seen at C3-C4, C5-C6 and C6-C7 with moderate severity intervertebral disc space narrowing at C4-C5. Bilateral moderate to marked severity multilevel facet joint hypertrophy is noted. Upper chest: Negative. Other: None. IMPRESSION: 1. No acute cervical spine fracture. 2. Moderate to marked severity multilevel degenerative changes, as described above. Electronically Signed   By: Aram Candela M.D.   On: 07/04/2023 20:17   CT Head Wo Contrast  Result Date: 07/04/2023 CLINICAL DATA:  Status post trauma. EXAM: CT HEAD WITHOUT CONTRAST TECHNIQUE: Contiguous axial images were obtained from the base of the skull through the vertex without intravenous contrast. RADIATION DOSE REDUCTION: This exam was performed according to the departmental dose-optimization program which includes automated exposure control, adjustment of the mA and/or kV according to patient size and/or use of iterative reconstruction technique. COMPARISON:  June 28, 2023 FINDINGS: Brain: There is moderate severity cerebral atrophy with widening of the extra-axial spaces and ventricular dilatation. There are areas of decreased attenuation within the white matter tracts of the supratentorial brain, consistent with microvascular disease  changes. 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 Vascular: No hyperdense vessel or unexpected calcification. Skull: Normal. Negative for fracture or focal lesion. Sinuses/Orbits: There is marked severity left maxillary sinus mucosal thickening. Other: None. IMPRESSION: 1. Generalized cerebral atrophy with chronic white matter small vessel ischemic changes. 2. Chronic right parieto-occipital infarct. 3. No acute intracranial abnormality. 4. Marked severity left maxillary sinus disease. Electronically Signed   By: Aram Candela M.D.   On: 07/04/2023 20:14   DG Pelvis Portable  Result Date: 07/04/2023 CLINICAL DATA:  Witnessed fall.  Weakness. EXAM: PORTABLE PELVIS 1-2 VIEWS COMPARISON:  None Available. FINDINGS: The cortical margins of the bony pelvis are intact. No fracture. Pubic symphysis and sacroiliac joints are congruent. Both femoral heads are well-seated in the respective acetabula. IMPRESSION: No pelvic fracture. Electronically Signed   By: Narda Rutherford M.D.   On: 07/04/2023 17:22   DG Chest Portable 1 View  Result Date: 07/04/2023 CLINICAL DATA:  Fall and weakness. EXAM: PORTABLE CHEST 1 VIEW COMPARISON:  Radiograph  and CT 05/26/2023 FINDINGS: Left-sided pacemaker in place. Stable cardiomegaly. Unchanged mediastinal contours, aortic atherosclerosis. Chronic scarring in the lung bases. There is some increase in interstitial opacity in the mid lower lung zones. No confluent airspace disease. No pneumothorax or large pleural effusion. On limited assessment, no acute osseous findings. Bilateral shoulder arthropathy. IMPRESSION: 1. No evidence of traumatic injury. 2. Cardiomegaly. Increase in interstitial opacity in the mid lower lung zones may represent pulmonary edema. Electronically Signed   By: Narda Rutherford M.D.   On: 07/04/2023 17:21    Pending Labs Unresulted Labs (From admission, onward)     Start     Ordered   07/11/23 0500  Creatinine, serum  (enoxaparin (LOVENOX)     CrCl >/= 30 ml/min)  Weekly,   R     Comments: while on enoxaparin therapy    07/04/23 2301   07/05/23 0500  Comprehensive metabolic panel  Daily,   R      07/04/23 2301   07/05/23 0500  CBC  Daily,   R      07/04/23 2301   07/04/23 2309  Blood gas, venous  Once,   R        07/04/23 2308   07/04/23 2301  Culture, blood (Routine X 2) w Reflex to ID Panel  BLOOD CULTURE X 2,   R (with TIMED occurrences)     Comments: for severe disease only    07/04/23 2301   07/04/23 2301  Ammonia  Once,   R        07/04/23 2301   07/04/23 2301  TSH  Once,   R        07/04/23 2301   07/04/23 2301  Magnesium  Once,   R        07/04/23 2301   07/04/23 2301  Phosphorus  Once,   R        07/04/23 2301   07/04/23 2300  Sodium, urine, random  Add-on,   AD        07/04/23 2301   07/04/23 2300  Urea nitrogen, urine  Add-on,   AD        07/04/23 2301   07/04/23 2300  Creatinine, urine, random  Add-on,   AD        07/04/23 2301            Vitals/Pain Today's Vitals   07/04/23 2131 07/04/23 2132 07/04/23 2133 07/04/23 2212  BP:  126/84    Pulse:    70  Resp: 20     Temp:      TempSrc:      SpO2:   100% 96%  Weight:      Height:      PainSc:        Isolation Precautions No active isolations  Medications Medications  cefTRIAXone (ROCEPHIN) 1 g in sodium chloride 0.9 % 100 mL IVPB (has no administration in time range)  enoxaparin (LOVENOX) injection 40 mg (has no administration in time range)  sodium chloride flush (NS) 0.9 % injection 3 mL (has no administration in time range)  acetaminophen (TYLENOL) tablet 650 mg (has no administration in time range)    Or  acetaminophen (TYLENOL) suppository 650 mg (has no administration in time range)  0.9 %  sodium chloride infusion (has no administration in time range)  senna-docusate (Senokot-S) tablet 1 tablet (has no administration in time range)  ondansetron (ZOFRAN) tablet 4 mg (has no administration in time range)    Or  ondansetron  (ZOFRAN) injection 4 mg (has no administration in time range)  insulin aspart (novoLOG) injection 0-6 Units (has no administration in time range)  insulin aspart (novoLOG) injection 0-5 Units ( Subcutaneous Not Given 07/05/23 0002)  lactated ringers bolus 1,000 mL (0 mLs Intravenous Stopped 07/04/23 2210)  doxycycline (VIBRA-TABS) tablet 100 mg (100 mg Oral Given 07/04/23 2240)    Mobility walks with device     Focused Assessments    R Recommendations: See Admitting Provider Note  Report given to:   Additional Notes: Patient alert, oriented, ambulatory with walker comes in with falls and weakness at home.

## 2023-07-05 NOTE — Progress Notes (Addendum)
PROGRESS NOTE  Bradley WINKELBAUER Sr.  GEX:[id number] DOB: September 06, 1941 DOA: 07/04/2023 PCP: [name], MD   Brief Narrative: Patient is a 82 year old male with history of COPD, chronic hypoxic/hypercarbic respiratory failure, diastolic CHF, hypertension, type 2 diabetes, coronary artery disease ,atrial fibrillation status post ablation who presented from home with complaint of intermittent confusion, generalized weakness, multiple falls.  Report of fall twice in the last week, progressive forgetfulness, confusion, weakness.  On presentation he was confused, very weak.  CT head, cervical spine CT did not show any acute findings.  Lab work showed a creatinine of 1.2, WC count of 20.  Patient started on antibiotics for right lower extremity cellulitis.  Overall clinical status is significantly improved, this morning he is alert and oriented.  Assessment & Plan:  Principal Problem:   Acute encephalopathy Active Problems:   Type 2 diabetes mellitus (HCC)   Essential hypertension   Atrial fibrillation (HCC)   Chronic respiratory failure with hypoxia and hypercapnia (HCC)   Chronic diastolic heart failure (HCC)   COPD,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   CAD (coronary artery disease)   AKI (acute kidney injury) (HCC)   Cellulitis of right leg    Acute encephalopathy: Presented with somnolence, generalized weakness, confusion.  CT head did not show any acute findings but shows generalized atrophy, chronic right parieto-occipital infarct.  Encephalopathy could be secondary to AKI or sepsis from cellulitis.  Ammonia level normal.  TSH level normal.  ABG did not show any hypercarbia.  This morning he is alert and oriented.  Very comfortable  AKI: Started on gentle IV fluids.  Now kidney function back to baseline.  Lasix and Farxiga on hold.  Right lower extremity cellulitis: Started on ceftriaxone.  Has mild leucocytosis, which is improving.  He is afebrile today.  Follow-up blood cultures  Coronary artery  disease: No  anginal symptoms.  On aspirin, Lipitor  COPD: Currently not in exacerbation.  On supplemental oxygen at 4 L at home.  Currently on the same.  Patient is a past smoker  Hypertension: Takes amlodipine, bisoprolol, Lasix at home.  These medicines are on hold due to soft blood pressure  Type 2 diabetes: Recent A1c of 6.3.  Currently on sliding scale  Hypokalemia: Supplemented with  potassium  Debility/deconditioning: PT/OT consulted.  He ambulates with the help of walker/cane, lives with his grandson and granddaughter      DVT prophylaxis:enoxaparin (LOVENOX) injection 40 mg Start: 07/05/23 1000     Code Status: Full Code  Family Communication:called and discussed with granddaughter Hospital doctor on phone on 9/23  Patient status:Obs  Patient is from :Home  Anticipated discharge WN:[id number]   Estimated DC date:1-2 days   Consultants: None  Procedures:none  Antimicrobials:  Anti-infectives (From admission, onward)    Start     Dose/Rate Route Frequency Ordered Stop   07/04/23 2315  cefTRIAXone (ROCEPHIN) 1 g in sodium chloride 0.9 % 100 mL IVPB        1 g 200 mL/hr over 30 Minutes Intravenous Daily at bedtime 07/04/23 2301 07/11/23 2159   07/04/23 2230  doxycycline (VIBRA-TABS) tablet 100 mg        100 mg Oral  Once 07/04/23 2226 07/04/23 2240       Subjective: Patient seen and examined at bedside today.  He was sitting in the chair.  He was alert and oriented.  Very comfortable.  He denies any shortness of breath or cough.  Right lower extremity erythema/edema improving.  His wife is very sick  and also currently admitted in the hospital.  Objective: Vitals:   07/05/23 0000 07/05/23 0117 07/05/23 0500 07/05/23 0513  BP: 114/60 121/62  112/64  Pulse: 71   71  Resp: (!) 30 20    Temp:  97.9 F (36.6 C)  98.1 F (36.7 C)  TempSrc:      SpO2: 92% 93%  100%  Weight:   83.9 kg   Height:        Intake/Output Summary (Last 24 hours) at 07/05/2023 0801 Last data filed at  07/05/2023 0600 Gross per 24 hour  Intake 1723.59 ml  Output --  Net 1723.59 ml   Filed Weights   07/04/23 1606 07/05/23 0500  Weight: 83.9 kg 83.9 kg    Examination:  General exam: Overall comfortable, not in distress, pleasant elderly gentleman HEENT: PERRL Respiratory system:  no wheezes or crackles  Cardiovascular system: S1 & S2 heard, RRR.  Gastrointestinal system: Abdomen is nondistended, soft and nontender. Central nervous system: Alert and oriented Extremities:  no clubbing ,no cyanosis Skin: Erythema, mild edema of right lower extremity with chronic venous stasis changes, shallow ulcer on the ventral surface of right leg   Data Reviewed: I have personally reviewed following labs and imaging studies  CBC: Recent Labs  Lab 07/04/23 1630 07/04/23 2323  WBC 20.3* 18.6*  NEUTROABS 17.6*  --   HGB 13.5 13.5  HCT 40.6 40.5  MCV 96.2 96.9  PLT 154 151   Basic Metabolic Panel: Recent Labs  Lab 07/04/23 1630 07/04/23 2323  NA 138 138  K 4.0 3.4*  CL 101 101  CO2 25 28  GLUCOSE 115* 103*  BUN 22 23  CREATININE 1.26* 1.11  CALCIUM 8.4* 8.3*  MG  --  2.1  PHOS  --  3.1     No results found for this or any previous visit (from the past 240 hour(s)).   Radiology Studies: CT Cervical Spine Wo Contrast  Result Date: 07/04/2023 CLINICAL DATA:  Status post fall. EXAM: CT CERVICAL SPINE WITHOUT CONTRAST TECHNIQUE: Multidetector CT imaging of the cervical spine was performed without intravenous contrast. Multiplanar CT image reconstructions were also generated. RADIATION DOSE REDUCTION: This exam was performed according to the departmental dose-optimization program which includes automated exposure control, adjustment of the mA and/or kV according to patient size and/or use of iterative reconstruction technique. COMPARISON:  None Available. FINDINGS: Alignment: There is reversal of the normal cervical spine lordosis. Skull base and vertebrae: No acute fracture. No  primary bone lesion or focal pathologic process. Soft tissues and spinal canal: No prevertebral fluid or swelling. No visible canal hematoma. Disc levels: Moderate severity endplate sclerosis, mild anterior osteophyte formation and mild posterior bony spurring are seen at the levels of C5-C6 and C6-C7. Mild anterior osteophyte formation is also seen at the level of C4-C5. Marked severity intervertebral disc space narrowing is seen at C3-C4, C5-C6 and C6-C7 with moderate severity intervertebral disc space narrowing at C4-C5. Bilateral moderate to marked severity multilevel facet joint hypertrophy is noted. Upper chest: Negative. Other: None. IMPRESSION: 1. No acute cervical spine fracture. 2. Moderate to marked severity multilevel degenerative changes, as described above. Electronically Signed   By: Aram Candela M.D.   On: 07/04/2023 20:17   CT Head Wo Contrast  Result Date: 07/04/2023 CLINICAL DATA:  Status post trauma. EXAM: CT HEAD WITHOUT CONTRAST TECHNIQUE: Contiguous axial images were obtained from the base of the skull through the vertex without intravenous contrast. RADIATION DOSE REDUCTION: This exam was performed  according to the departmental dose-optimization program which includes automated exposure control, adjustment of the mA and/or kV according to patient size and/or use of iterative reconstruction technique. COMPARISON:  June 28, 2023 FINDINGS: Brain: There is moderate severity cerebral atrophy with widening of the extra-axial spaces and ventricular dilatation. There are areas of decreased attenuation within the white matter tracts of the supratentorial brain, consistent with microvascular disease changes. 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 Vascular: No hyperdense vessel or unexpected calcification. Skull: Normal. Negative for fracture or focal lesion. Sinuses/Orbits: There is marked severity left maxillary sinus mucosal thickening. Other: None. IMPRESSION: 1.  Generalized cerebral atrophy with chronic white matter small vessel ischemic changes. 2. Chronic right parieto-occipital infarct. 3. No acute intracranial abnormality. 4. Marked severity left maxillary sinus disease. Electronically Signed   By: Aram Candela M.D.   On: 07/04/2023 20:14   DG Pelvis Portable  Result Date: 07/04/2023 CLINICAL DATA:  Witnessed fall.  Weakness. EXAM: PORTABLE PELVIS 1-2 VIEWS COMPARISON:  None Available. FINDINGS: The cortical margins of the bony pelvis are intact. No fracture. Pubic symphysis and sacroiliac joints are congruent. Both femoral heads are well-seated in the respective acetabula. IMPRESSION: No pelvic fracture. Electronically Signed   By: Narda Rutherford M.D.   On: 07/04/2023 17:22   DG Chest Portable 1 View  Result Date: 07/04/2023 CLINICAL DATA:  Fall and weakness. 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 Stable cardiomegaly. Unchanged mediastinal contours, aortic atherosclerosis. Chronic scarring in the lung bases. There is some increase in interstitial opacity in the mid lower lung zones. No confluent airspace disease. No pneumothorax or large pleural effusion. On limited assessment, no acute osseous findings. Bilateral shoulder arthropathy. IMPRESSION: 1. No evidence of traumatic injury. 2. Cardiomegaly. Increase in interstitial opacity in the mid lower lung zones may represent pulmonary edema. Electronically Signed   By: Narda Rutherford M.D.   On: 07/04/2023 17:21    Scheduled Meds:  enoxaparin (LOVENOX) injection  40 mg Subcutaneous Daily   insulin aspart  0-5 Units Subcutaneous QHS   insulin aspart  0-6 Units Subcutaneous TID WC   sodium chloride flush  3 mL Intravenous Q12H   Continuous Infusions:  cefTRIAXone (ROCEPHIN)  IV Stopped (07/05/23 0106)     LOS: 0 days   Burnadette Pop, MD [facility] P9/24/2024, 8:01 AM

## 2023-07-05 NOTE — Evaluation (Signed)
Physical Therapy Evaluation Patient Details Name: Bradley ZARRELLA Sr. MRN: [id number] DOB: 22-Mar-1941 Today's Date: 07/05/2023  History of Present Illness  82yo male who presented to [facility] 07/04/23 due to intermittent confusion, weakness, multiple falls. Imaging clear of acute injury. Admit with acute encephalopathy, AKI, RLE cellulitis. OA, Afib, CAD, CHF, HLD, HTN, chronic diastolic heart failure, chronic respiratory failure, hx intracranial bleed 2013, home O2 use, pacemaker, PVD, pulmonary HTN, DM, lung decortication, pleural scarification  Clinical Impression   Pt received in bed sleeping but easily woken. Able to mobilize well today just fairly weak and did get a bit lightheaded with mobility all vitals WNL however, RN aware. Has good support from family at home, unfortunately his wife is currently in a SNF for rehab herself at the moment however. Left up in recliner with all needs met and chair alarm active. Will benefit from appropriate skilled PT f/u at DC.         If plan is discharge home, recommend the following: A little help with walking and/or transfers;Assistance with cooking/housework;Assist for transportation;A little help with bathing/dressing/bathroom;Help with stairs or ramp for entrance   Can travel by private vehicle        Equipment Recommendations None recommended by PT (well equipped)  Recommendations for Other Services       Functional Status Assessment Patient has had a recent decline in their functional status and demonstrates the ability to make significant improvements in function in a reasonable and predictable amount of time.     Precautions / Restrictions Precautions Precautions: Fall;Other (comment) Precaution Comments: watch vitals, has hx of L wrist fx in brace but reports MD told him OK for WB LUE in brace Restrictions Weight Bearing Restrictions: No      Mobility  Bed Mobility Overal bed mobility: Modified Independent             General bed  mobility comments: HOB elevated, use of rails    Transfers Overall transfer level: Modified independent Equipment used: Rolling walker (2 wheels)               General transfer comment: SBA, good awareness of hand palcement    Ambulation/Gait Ambulation/Gait assistance: Contact guard assist Gait Distance (Feet): 100 Feet Assistive device: Rolling walker (2 wheels) Gait Pattern/deviations: Step-through pattern, Decreased step length - right, Decreased step length - left, Trunk flexed, Drifts right/left Gait velocity: decreased     General Gait Details: slow and steady with RW  Stairs            Wheelchair Mobility     Tilt Bed    Modified Rankin (Stroke Patients Only)       Balance Overall balance assessment: History of Falls, Needs assistance Sitting-balance support: No upper extremity supported, Feet supported Sitting balance-Leahy Scale: Good     Standing balance support: Bilateral upper extremity supported, Reliant on assistive device for balance Standing balance-Leahy Scale: Poor                               Pertinent Vitals/Pain Pain Assessment Pain Assessment: No/denies pain    Home Living Family/patient expects to be discharged to:: Private residence Living Arrangements: Spouse/significant other;Other relatives Available Help at Discharge: Family;Available 24 hours/day Type of Home: House Home Access: Stairs to enter Entrance Stairs-Rails: None;Right Entrance Stairs-Number of Steps: 1+1 Alternate Level Stairs-Number of Steps: 3 story townhome with stair lift to 2nd floor, does not go to 3rd floor  Home Layout: Multi-level Home Equipment: Cane - single point;Shower seat - built in;Grab bars - Chartered loss adjuster (2 wheels) Additional Comments: 4LPM O2 at baseline 24/7    Prior Function Prior Level of Function : Independent/Modified Independent;History of Falls (last six months)             Mobility Comments: falls  that brought him to the hospital ADLs Comments: very mobile at baseline, was able to do pretty much everything for himself     Extremity/Trunk Assessment   Upper Extremity Assessment Upper Extremity Assessment: Defer to OT evaluation    Lower Extremity Assessment Lower Extremity Assessment: Generalized weakness    Cervical / Trunk Assessment Cervical / Trunk Assessment: Kyphotic  Communication   Communication Communication: No apparent difficulties  Cognition Arousal: Alert Behavior During Therapy: [facility] for tasks assessed/performed                                            General Comments      Exercises     Assessment/Plan    PT Assessment Patient needs continued PT services  PT Problem List Decreased strength;Decreased balance;Decreased mobility;Cardiopulmonary status limiting activity;Decreased activity tolerance       PT Treatment Interventions DME instruction;Therapeutic activities;Gait training;Therapeutic exercise;Patient/family education;Stair training;Balance training;Functional mobility training;Neuromuscular re-education    PT Goals (Current goals can be found in the Care Plan section)  Acute Rehab PT Goals Patient Stated Goal: go home when medically ready PT Goal Formulation: With patient Time For Goal Achievement: 07/19/23 Potential to Achieve Goals: Good    Frequency Min 1X/week     Co-evaluation               AM-PAC PT "6 Clicks" Mobility  Outcome Measure Help needed turning from your back to your side while in a flat bed without using bedrails?: None Help needed moving from lying on your back to sitting on the side of a flat bed without using bedrails?: None Help needed moving to and from a bed to a chair (including a wheelchair)?: None Help needed standing up from a chair using your arms (e.g., wheelchair or bedside chair)?: None Help needed to walk in hospital room?: A Little Help needed climbing 3-5 steps with a  railing? : A Little 6 Click Score: 22    End of Session Equipment Utilized During Treatment: Gait belt;Oxygen Activity Tolerance: Patient tolerated treatment well Patient left: in chair;with call bell/phone within reach;with chair alarm set Nurse Communication: Mobility status PT Visit Diagnosis: Unsteadiness on feet (R26.81);Muscle weakness (generalized) (M62.81);Repeated falls (R29.6)    Time: 8469-6295 PT Time Calculation (min) (ACUTE ONLY): 28 min   Charges:   PT Evaluation $PT Eval Low Complexity: 1 Low PT Treatments $Gait Training: 8-22 mins PT General Charges $$ ACUTE PT VISIT: 1 Visit       Nedra Hai, PT, DPT 07/05/23 10:20 AM

## 2023-07-06 DIAGNOSIS — G9340 Encephalopathy, unspecified: Secondary | ICD-10-CM | POA: Diagnosis not present

## 2023-07-06 LAB — GLUCOSE, CAPILLARY
Glucose-Capillary: 100 mg/dL — ABNORMAL HIGH (ref 70–99)
Glucose-Capillary: 166 mg/dL — ABNORMAL HIGH (ref 70–99)
Glucose-Capillary: 140 mg/dL — ABNORMAL HIGH (ref 70–99)
Glucose-Capillary: 134 mg/dL — ABNORMAL HIGH (ref 70–99)

## 2023-07-06 LAB — BASIC METABOLIC PANEL
Anion gap: 9 (ref 5–15)
BUN: 15 mg/dL (ref 8–23)
CO2: 25 mmol/L (ref 22–32)
Calcium: 8.3 mg/dL — ABNORMAL LOW (ref 8.9–10.3)
Chloride: 104 mmol/L (ref 98–111)
Creatinine, Ser: 0.75 mg/dL (ref 0.61–1.24)
GFR, Estimated: >60 mL/min (ref >60)
Glucose, Bld: 101 mg/dL — ABNORMAL HIGH (ref 70–99)
Potassium: 3.4 mmol/L — ABNORMAL LOW (ref 3.5–5.1)
Sodium: 138 mmol/L (ref 135–145)

## 2023-07-06 LAB — CBC
HCT: 41.7 % (ref 39.0–52.0)
Hemoglobin: 13.7 g/dL (ref 13.0–17.0)
MCH: 30.9 pg (ref 26.0–34.0)
MCHC: 32.9 g/dL (ref 30.0–36.0)
MCV: 93.9 fL (ref 80.0–100.0)
Platelets: 145 10*3/uL — ABNORMAL LOW (ref 150–400)
RBC: 4.44 MIL/uL (ref 4.22–5.81)
RDW: 14.7 % (ref 11.5–15.5)
WBC: 9.8 10*3/uL (ref 4.0–10.5)
nRBC: 0 % (ref 0.0–0.2)

## 2023-07-06 LAB — TROPONIN I (HIGH SENSITIVITY)
Troponin I (High Sensitivity): 31 ng/L — ABNORMAL HIGH (ref <18)
Troponin I (High Sensitivity): 25 ng/L — ABNORMAL HIGH (ref <18)

## 2023-07-06 MED ORDER — POTASSIUM CHLORIDE CRYS ER 20 MEQ PO TBCR
40.0000 meq | EXTENDED_RELEASE_TABLET | Freq: Once | ORAL | Status: AC
  Administered 2023-07-06: 40 meq via ORAL
  Filled 2023-07-06: qty 2

## 2023-07-06 NOTE — Progress Notes (Signed)
PROGRESS NOTE  Bradley ORMES Sr.  XLK:[id number] DOB: 1940/10/29 DOA: 07/04/2023 PCP: [name], MD   Brief Narrative: Patient is a 82 year old male with history of COPD, chronic hypoxic/hypercarbic respiratory failure, diastolic CHF, hypertension, type 2 diabetes, coronary artery disease ,atrial fibrillation status post ablation who presented from home with complaint of intermittent confusion, generalized weakness, multiple falls.  Report of fall twice in the last week, progressive forgetfulness, confusion, weakness.  On presentation he was confused, very weak.  CT head, cervical spine CT did not show any acute findings.  Lab work showed a creatinine of 1.2, WC count of 20.  Patient started on antibiotics for right lower extremity cellulitis.  Blood cultures now showing gram-negative rods, waiting for culture and sensitivity.  Patient has clinically improved.  Assessment & Plan:  Principal Problem:   Acute encephalopathy Active Problems:   Type 2 diabetes mellitus (HCC)   Essential hypertension   Atrial fibrillation (HCC)   Chronic respiratory failure with hypoxia and hypercapnia (HCC)   Chronic diastolic heart failure (HCC)   COPD,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   CAD (coronary artery disease)   AKI (acute kidney injury) (HCC)   Cellulitis of right leg   Gram-negative bacteremia/Right lower extremity cellulitis: Started on ceftriaxone.  Leukocytosis resolved.  Right lower extremity cellulitis looks significantly better today.  Waiting for culture and sensitivity of the final blood culture report.  Sepsis physiology has resolved  Acute encephalopathy: Presented with somnolence, generalized weakness, confusion.  CT head did not show any acute findings but shows generalized atrophy, chronic right parieto-occipital infarct.  Encephalopathy could be secondary to AKI or sepsis from cellulitis.  Ammonia level normal.  TSH level normal.  ABG did not show any hypercarbia.  This morning he is alert and oriented.    AKI: Started on gentle IV fluids.  AKI resolved.  Lasix and Farxiga on hold.  Chest pain /Coronary artery  disease:  On aspirin, Lipitor at home.  Complained of left-sided chest pressure this morning but it has already resolved.  Pending troponin.  EKG did not show any acute ischemic changes  COPD: Currently not in exacerbation.  On supplemental oxygen at 4 L at home.  Currently on the same.  Patient is a past smoker  Hypertension: Takes amlodipine, bisoprolol, Lasix at home.  These medicines are on hold due to soft blood pressure  Type 2 diabetes: Recent A1c of 6.3.  Currently on sliding scale  Hypokalemia: Supplemented with  potassium  Debility/deconditioning: PT/OT consulted,recommended HH.  He ambulates with the help of walker/cane, lives with his grandson and granddaughter      DVT prophylaxis:enoxaparin (LOVENOX) injection 40 mg Start: 07/05/23 1000     Code Status: Full Code  Family Communication:called and discussed with granddaughter Hospital doctor on phone on 9/23  Patient status:Obs  Patient is from :Home  Anticipated discharge DG:UYQI   Estimated DC date: Likely tomorrow, awaiting blood culture report   Consultants: None  Procedures:none  Antimicrobials:  Anti-infectives (From admission, onward)    Start     Dose/Rate Route Frequency Ordered Stop   07/05/23 2200  cefTRIAXone (ROCEPHIN) 2 g in sodium chloride 0.9 % 100 mL IVPB        2 g 200 mL/hr over 30 Minutes Intravenous Daily at bedtime 07/05/23 0807 07/11/23 2159   07/04/23 2315  cefTRIAXone (ROCEPHIN) 1 g in sodium chloride 0.9 % 100 mL IVPB  Status:  Discontinued        1 g 200 mL/hr over 30 Minutes  Intravenous Daily at bedtime 07/04/23 2301 07/05/23 0807   07/04/23 2230  doxycycline (VIBRA-TABS) tablet 100 mg        100 mg Oral  Once 07/04/23 2226 07/04/23 2240       Subjective: Patient seen and the bedside today.  He complains of left-sided chest pressure today.  Upon further follow-up with the RN,  his chest pressure has resolved.  Patient remains afebrile.  Right lower extremity cellulitis looks significantly better today.  Objective: Vitals:   07/05/23 0932 07/05/23 2059 07/06/23 0438 07/06/23 0906  BP: 103/60 134/73 126/70 (!) 94/47  Pulse: 79 80 69 75  Resp: 18 16 16 18   Temp: 98.3 F (36.8 C) (!) 97.5 F (36.4 C) 98.3 F (36.8 C)   TempSrc: Oral  Oral   SpO2: 100% 98% 98% 97%  Weight:      Height:        Intake/Output Summary (Last 24 hours) at 07/06/2023 1136 Last data filed at 07/06/2023 0900 Gross per 24 hour  Intake 820 ml  Output 0 ml  Net 820 ml   Filed Weights   07/04/23 1606 07/05/23 0500  Weight: 83.9 kg 83.9 kg    Examination:  General exam: Overall comfortable, not in distress, pleasant elderly gentleman HEENT: PERRL Respiratory system:  no wheezes or crackles  Cardiovascular system: S1 & S2 heard, RRR.  Gastrointestinal system: Abdomen is nondistended, soft and nontender. Central nervous system: Alert and oriented Extremities: No edema, no clubbing ,no cyanosis Skin:    Erythema, mild edema of right lower extremity with chronic venous stasis changes, shallow ulcer on the ventral surface of right leg   Data Reviewed: I have personally reviewed following labs and imaging studies  CBC: Recent Labs  Lab 07/04/23 1630 07/04/23 2323 07/06/23 0504  WBC 20.3* 18.6* 9.8  NEUTROABS 17.6*  --   --   HGB 13.5 13.5 13.7  HCT 40.6 40.5 41.7  MCV 96.2 96.9 93.9  PLT 154 151 145*   Basic Metabolic Panel: Recent Labs  Lab 07/04/23 1630 07/04/23 2323 07/06/23 0504  NA 138 138 138  K 4.0 3.4* 3.4*  CL 101 101 104  CO2 25 28 25   GLUCOSE 115* 103* 101*  BUN 22 23 15   CREATININE 1.26* 1.11 0.75  CALCIUM 8.4* 8.3* 8.3*  MG  --  2.1  --   PHOS  --  3.1  --      Recent Results (from the past 240 hour(s))  Culture, blood (Routine X 2) w Reflex to ID Panel     Status: None (Preliminary result)   Collection Time: 07/04/23 11:23 PM   Specimen:  BLOOD RIGHT ARM  Result Value Ref Range Status   Specimen Description BLOOD RIGHT ARM  Final   Special Requests   Final    BOTTLES DRAWN AEROBIC AND ANAEROBIC Blood Culture adequate volume   Culture  Setup Time   Final    GRAM NEGATIVE RODS IN BOTH AEROBIC AND ANAEROBIC BOTTLES CRITICAL RESULT CALLED TO, READ BACK BY AND VERIFIED WITH: PHARMD ANDREW MEYER ON 07/05/23 @ 1908 BY DRT Performed at [facility], [address], [place], [place] [address]    Culture GRAM NEGATIVE RODS  Final   Report Status PENDING  Incomplete  Culture, blood (Routine X 2) w Reflex to ID Panel     Status: None (Preliminary result)   Collection Time: 07/04/23 11:23 PM   Specimen: BLOOD LEFT ARM  Result Value Ref Range Status  Specimen Description BLOOD LEFT ARM  Final   Special Requests   Final    BOTTLES DRAWN AEROBIC AND ANAEROBIC Blood Culture adequate volume   Culture  Setup Time   Final    GRAM NEGATIVE RODS IN BOTH AEROBIC AND ANAEROBIC BOTTLES CRITICAL VALUE NOTED.  VALUE IS CONSISTENT WITH PREVIOUSLY REPORTED AND CALLED VALUE. Performed at [facility], [address] [place], [place] [address]    Culture GRAM NEGATIVE RODS  Final   Report Status PENDING  Incomplete  Blood Culture ID Panel (Reflexed)     Status: None   Collection Time: 07/04/23 11:23 PM  Result Value Ref Range Status   Enterococcus faecalis NOT DETECTED NOT DETECTED Final   Enterococcus Faecium NOT DETECTED NOT DETECTED Final   Listeria monocytogenes NOT DETECTED NOT DETECTED Final   Staphylococcus species NOT DETECTED NOT DETECTED Final   Staphylococcus aureus (BCID) NOT DETECTED NOT DETECTED Final   Staphylococcus epidermidis NOT DETECTED NOT DETECTED Final   Staphylococcus lugdunensis NOT DETECTED NOT DETECTED Final   Streptococcus species NOT DETECTED NOT DETECTED Final   Streptococcus agalactiae NOT DETECTED NOT DETECTED Final   Streptococcus pneumoniae NOT DETECTED NOT DETECTED Final   Streptococcus  pyogenes NOT DETECTED NOT DETECTED Final   A.calcoaceticus-baumannii NOT DETECTED NOT DETECTED Final   Bacteroides fragilis NOT DETECTED NOT DETECTED Final   Enterobacterales NOT DETECTED NOT DETECTED Final   Enterobacter cloacae complex NOT DETECTED NOT DETECTED Final   Escherichia coli NOT DETECTED NOT DETECTED Final   Klebsiella aerogenes NOT DETECTED NOT DETECTED Final   Klebsiella oxytoca NOT DETECTED NOT DETECTED Final   Klebsiella pneumoniae NOT DETECTED NOT DETECTED Final   Proteus species NOT DETECTED NOT DETECTED Final   Salmonella species NOT DETECTED NOT DETECTED Final   Serratia marcescens NOT DETECTED NOT DETECTED Final   Haemophilus influenzae NOT DETECTED NOT DETECTED Final   Neisseria meningitidis NOT DETECTED NOT DETECTED Final   Pseudomonas aeruginosa NOT DETECTED NOT DETECTED Final   Stenotrophomonas maltophilia NOT DETECTED NOT DETECTED Final   Candida albicans NOT DETECTED NOT DETECTED Final   Candida auris NOT DETECTED NOT DETECTED Final   Candida glabrata NOT DETECTED NOT DETECTED Final   Candida krusei NOT DETECTED NOT DETECTED Final   Candida parapsilosis NOT DETECTED NOT DETECTED Final   Candida tropicalis NOT DETECTED NOT DETECTED Final   Cryptococcus neoformans/gattii NOT DETECTED NOT DETECTED Final    Comment: Performed at [facility], [address] [place], [place] [address]     Radiology Studies: CT Cervical Spine Wo Contrast  Result Date: 07/04/2023 CLINICAL DATA:  Status post fall. EXAM: CT CERVICAL SPINE WITHOUT CONTRAST TECHNIQUE: Multidetector CT imaging of the cervical spine was performed without intravenous contrast. Multiplanar CT image reconstructions were also generated. RADIATION DOSE REDUCTION: This exam was performed according to the departmental dose-optimization program which includes automated exposure control, adjustment of the mA and/or kV according to patient size and/or use of iterative reconstruction technique. COMPARISON:   None Available. FINDINGS: Alignment: There is reversal of the normal cervical spine lordosis. Skull base and vertebrae: No acute fracture. No primary bone lesion or focal pathologic process. Soft tissues and spinal canal: No prevertebral fluid or swelling. No visible canal hematoma. Disc levels: Moderate severity endplate sclerosis, mild anterior osteophyte formation and mild posterior bony spurring are seen at the levels of C5-C6 and C6-C7. Mild anterior osteophyte formation is also seen at the level of C4-C5. Marked severity intervertebral disc space narrowing is seen at  C3-C4, C5-C6 and C6-C7 with moderate severity intervertebral disc space narrowing at C4-C5. Bilateral moderate to marked severity multilevel facet joint hypertrophy is noted. Upper chest: Negative. Other: None. IMPRESSION: 1. No acute cervical spine fracture. 2. Moderate to marked severity multilevel degenerative changes, as described above. Electronically Signed   By: Aram Candela M.D.   On: 07/04/2023 20:17   CT Head Wo Contrast  Result Date: 07/04/2023 CLINICAL DATA:  Status post trauma. EXAM: CT HEAD WITHOUT CONTRAST TECHNIQUE: Contiguous axial images were obtained from the base of the skull through the vertex without intravenous contrast. RADIATION DOSE REDUCTION: This exam was performed according to the departmental dose-optimization program which includes automated exposure control, adjustment of the mA and/or kV according to patient size and/or use of iterative reconstruction technique. COMPARISON:  June 28, 2023 FINDINGS: Brain: There is moderate severity cerebral atrophy with widening of the extra-axial spaces and ventricular dilatation. There are areas of decreased attenuation within the white matter tracts of the supratentorial brain, consistent with microvascular disease changes. 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 Vascular: No hyperdense vessel or unexpected calcification. Skull: Normal. Negative for  fracture or focal lesion. Sinuses/Orbits: There is marked severity left maxillary sinus mucosal thickening. Other: None. IMPRESSION: 1. Generalized cerebral atrophy with chronic white matter small vessel ischemic changes. 2. Chronic right parieto-occipital infarct. 3. No acute intracranial abnormality. 4. Marked severity left maxillary sinus disease. Electronically Signed   By: Aram Candela M.D.   On: 07/04/2023 20:14   DG Pelvis Portable  Result Date: 07/04/2023 CLINICAL DATA:  Witnessed fall.  Weakness. EXAM: PORTABLE PELVIS 1-2 VIEWS COMPARISON:  None Available. FINDINGS: The cortical margins of the bony pelvis are intact. No fracture. Pubic symphysis and sacroiliac joints are congruent. Both femoral heads are well-seated in the respective acetabula. IMPRESSION: No pelvic fracture. Electronically Signed   By: Narda Rutherford M.D.   On: 07/04/2023 17:22   DG Chest Portable 1 View  Result Date: 07/04/2023 CLINICAL DATA:  Fall and weakness. 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 Stable cardiomegaly. Unchanged mediastinal contours, aortic atherosclerosis. Chronic scarring in the lung bases. There is some increase in interstitial opacity in the mid lower lung zones. No confluent airspace disease. No pneumothorax or large pleural effusion. On limited assessment, no acute osseous findings. Bilateral shoulder arthropathy. IMPRESSION: 1. No evidence of traumatic injury. 2. Cardiomegaly. Increase in interstitial opacity in the mid lower lung zones may represent pulmonary edema. Electronically Signed   By: Narda Rutherford M.D.   On: 07/04/2023 17:21    Scheduled Meds:  enoxaparin (LOVENOX) injection  40 mg Subcutaneous Daily   insulin aspart  0-5 Units Subcutaneous QHS   insulin aspart  0-6 Units Subcutaneous TID WC   sodium chloride flush  3 mL Intravenous Q12H   Continuous Infusions:  cefTRIAXone (ROCEPHIN)  IV 2 g (07/05/23  2144)     LOS: 1 day   Burnadette Pop, MD [facility] P9/25/2024, 11:36 AM

## 2023-07-06 NOTE — Progress Notes (Signed)
[facility] Monica Becton MD paged that cardiac monitoring will expire in 3 hrs

## 2023-07-07 DIAGNOSIS — G9340 Encephalopathy, unspecified: Secondary | ICD-10-CM | POA: Diagnosis not present

## 2023-07-07 LAB — CULTURE, BLOOD (ROUTINE X 2)
Special Requests: ADEQUATE
Special Requests: ADEQUATE

## 2023-07-07 LAB — GLUCOSE, CAPILLARY
Glucose-Capillary: 126 mg/dL — ABNORMAL HIGH (ref 70–99)
Glucose-Capillary: 91 mg/dL (ref 70–99)
Glucose-Capillary: 91 mg/dL (ref 70–99)
Glucose-Capillary: 151 mg/dL — ABNORMAL HIGH (ref 70–99)

## 2023-07-07 LAB — BASIC METABOLIC PANEL
Anion gap: 5 (ref 5–15)
BUN: 7 mg/dL — ABNORMAL LOW (ref 8–23)
CO2: 26 mmol/L (ref 22–32)
Calcium: 8.2 mg/dL — ABNORMAL LOW (ref 8.9–10.3)
Chloride: 107 mmol/L (ref 98–111)
Creatinine, Ser: 0.76 mg/dL (ref 0.61–1.24)
GFR, Estimated: >60 mL/min (ref >60)
Glucose, Bld: 110 mg/dL — ABNORMAL HIGH (ref 70–99)
Potassium: 4.8 mmol/L (ref 3.5–5.1)
Sodium: 138 mmol/L (ref 135–145)

## 2023-07-07 MED ORDER — AMOXICILLIN-POT CLAVULANATE 875-125 MG PO TABS
1.0000 | ORAL_TABLET | Freq: Two times a day (BID) | ORAL | Status: AC
  Administered 2023-07-08 – 2023-07-11 (×7): 1 via ORAL
  Filled 2023-07-07 (×7): qty 1

## 2023-07-07 MED ORDER — PANTOPRAZOLE SODIUM 40 MG PO TBEC
40.0000 mg | DELAYED_RELEASE_TABLET | Freq: Every day | ORAL | Status: DC
  Administered 2023-07-07 – 2023-07-11 (×5): 40 mg via ORAL
  Filled 2023-07-07 (×5): qty 1

## 2023-07-07 NOTE — Plan of Care (Signed)
  Problem: Education: Goal: Knowledge of risk factors and measures for prevention of condition will improve Outcome: Progressing   Problem: Coping: Goal: Psychosocial and spiritual needs will be supported Outcome: Progressing   Problem: Respiratory: Goal: Will maintain a patent airway Outcome: Progressing Goal: Complications related to the disease process, condition or treatment will be avoided or minimized Outcome: Progressing   Problem: Education: Goal: Understanding of CV disease, CV risk reduction, and recovery process will improve Outcome: Progressing Goal: Individualized Educational Video(s) Outcome: Progressing   Problem: Activity: Goal: Ability to return to baseline activity level will improve Outcome: Progressing   Problem: Cardiovascular: Goal: Ability to achieve and maintain adequate cardiovascular perfusion will improve Outcome: Progressing Goal: Vascular access site(s) Level 0-1 will be maintained Outcome: Progressing   Problem: Health Behavior/Discharge Planning: Goal: Ability to safely manage health-related needs after discharge will improve Outcome: Progressing   Problem: Education: Goal: Knowledge of cardiac device and self-care will improve Outcome: Progressing Goal: Ability to safely manage health related needs after discharge will improve Outcome: Progressing Goal: Individualized Educational Video(s) Outcome: Progressing   Problem: Cardiac: Goal: Ability to achieve and maintain adequate cardiopulmonary perfusion will improve Outcome: Progressing   Problem: Education: Goal: Ability to describe self-care measures that may prevent or decrease complications (Diabetes Survival Skills Education) will improve Outcome: Progressing Goal: Individualized Educational Video(s) Outcome: Progressing   Problem: Coping: Goal: Ability to adjust to condition or change in health will improve Outcome: Progressing   Problem: Fluid Volume: Goal: Ability to  maintain a balanced intake and output will improve Outcome: Progressing   Problem: Health Behavior/Discharge Planning: Goal: Ability to identify and utilize available resources and services will improve Outcome: Progressing Goal: Ability to manage health-related needs will improve Outcome: Progressing   Problem: Metabolic: Goal: Ability to maintain appropriate glucose levels will improve Outcome: Progressing   Problem: Nutritional: Goal: Maintenance of adequate nutrition will improve Outcome: Progressing Goal: Progress toward achieving an optimal weight will improve Outcome: Progressing   Problem: Skin Integrity: Goal: Risk for impaired skin integrity will decrease Outcome: Progressing   Problem: Tissue Perfusion: Goal: Adequacy of tissue perfusion will improve Outcome: Progressing   Problem: Education: Goal: Knowledge of General Education information will improve Description: Including pain rating scale, medication(s)/side effects and non-pharmacologic comfort measures Outcome: Progressing   Problem: Health Behavior/Discharge Planning: Goal: Ability to manage health-related needs will improve Outcome: Progressing   Problem: Clinical Measurements: Goal: Ability to maintain clinical measurements within normal limits will improve Outcome: Progressing Goal: Will remain free from infection Outcome: Progressing Goal: Diagnostic test results will improve Outcome: Progressing Goal: Respiratory complications will improve Outcome: Progressing Goal: Cardiovascular complication will be avoided Outcome: Progressing   Problem: Activity: Goal: Risk for activity intolerance will decrease Outcome: Progressing   Problem: Nutrition: Goal: Adequate nutrition will be maintained Outcome: Progressing   Problem: Coping: Goal: Level of anxiety will decrease Outcome: Progressing   Problem: Elimination: Goal: Will not experience complications related to bowel motility Outcome:  Progressing Goal: Will not experience complications related to urinary retention Outcome: Progressing   Problem: Pain Managment: Goal: General experience of comfort will improve Outcome: Progressing   Problem: Safety: Goal: Ability to remain free from injury will improve Outcome: Progressing   Problem: Skin Integrity: Goal: Risk for impaired skin integrity will decrease Outcome: Progressing

## 2023-07-07 NOTE — Consult Note (Signed)
[facility] Inpatient Consult   07/07/2023  Bradley MCEVILLY Sr. 24-Dec-1940 [id number]   [facility] [[facility]]  Accountable Care Organization [ACO] Patient: Medicare ACO REACH  Primary Care Provider: [name], MD, [facility], [place]  provider is listed for the transition of care follow up appointments and calls   The patient was screened for hospitalization with noted high risk score for unplanned readmission risk 2 hospital admissions and 3 ED visits in 6 months.  The patient was assessed for potential [facility]) Care Management service needs for post hospital transition for care coordination. Review of patient's electronic medical record reveals patient is from home, wife ill and hospitalized. Admitted with falls at home.  Encephalopathy.   Currently, being recommended for home with [facility]. Reviewed inpatient [facility] team notes for disposition needs.  Patient has been followed by [facility] pharmacist noted for care coordination.  Plan: [facility] Liaison will continue to follow progress and disposition to asess for post hospital community care coordination/management needs.  Referral request for community care coordination: continue to follow   Community Care Management/Population Health does not replace or interfere with any arrangements made by the Inpatient Transition of Care team.   For questions contact:   Charlesetta Shanks, RN, BSN, CCM [place]  [facility], [facility] Liaison Direct Dial: [phone] or secure chat Website: [email or web address]

## 2023-07-07 NOTE — Progress Notes (Signed)
Physical Therapy Treatment Patient Details Name: Bradley WEB Sr. MRN: [id number] DOB: 04/29/1941 Today's Date: 07/07/2023   History of Present Illness 82yo male who presented to [facility] 07/04/23 due to intermittent confusion, weakness, multiple falls. Imaging clear of acute injury. Admit with acute encephalopathy, AKI, RLE cellulitis. PMH: OA, Afib, CAD, CHF, HLD, HTN, chronic diastolic heart failure, chronic respiratory failure, hx intracranial bleed 2013, home O2 use, pacemaker, PVD, pulmonary HTN, DM, lung decortication, pleural scarification.    PT Comments  Pt received in supine, agreeable to therapy session, with good participation and effort for transfer, gait and stair training. Pt with moderate to severe dyspnea on exertion (3/4) and unable to easily carry on conversation as he fatigued until after seated break 1-2 mins. Pt c/o L chest pressure/pinching after performing stairs in area where LUE tele sticker located, RN called to assess as monitor also reporting PVCs. Pt reports discomfort goes away after seated break and returned to room, RN to replace tele sticker leads for his comfort. Pt reports his spouse who is also currently in hospital will be going off of hemodialysis and he is focused on this but still agreeable to participate. Pt needing reminders for safe UE placement with transfers and CGA to Supervision for most tasks using RW/rails, and up to minA on stairs for stability with R rail. Pt continues to benefit from PT services to progress toward functional mobility goals.     If plan is discharge home, recommend the following: A little help with walking and/or transfers;Assistance with cooking/housework;Assist for transportation;A little help with bathing/dressing/bathroom;Help with stairs or ramp for entrance   Can travel by private vehicle        Equipment Recommendations  None recommended by PT (continue using his personal RW)    Recommendations for Other Services        Precautions / Restrictions Precautions Precautions: Fall;Other (comment) Precaution Comments: watch vitals, has hx of L wrist fx in brace but reports MD told him OK for WB LUE in brace Restrictions Weight Bearing Restrictions: No     Mobility  Bed Mobility Overal bed mobility: Needs Assistance Bed Mobility: Supine to Sit     Supine to sit: HOB elevated, Used rails, Supervision     General bed mobility comments: assist for bed linen mgmt and increased time to sit upright today to L EOB.    Transfers Overall transfer level: Needs assistance Equipment used: Rolling walker (2 wheels) Transfers: Sit to/from Stand Sit to Stand: Supervision           General transfer comment: cues for safe UE placement, pt plopping today when sitting and trying to push down on RW when standing    Ambulation/Gait Ambulation/Gait assistance: Contact guard assist, Supervision Gait Distance (Feet): 100 Feet (x2 with seated break ~3 mins in hallway due to tele beeping and pt c/o chest pressure) Assistive device: Rolling walker (2 wheels) Gait Pattern/deviations: Step-through pattern, Decreased step length - right, Decreased step length - left, Trunk flexed, Drifts right/left Gait velocity: decreased     General Gait Details: min cues for safety/activitiy pacing, pt took seated break halfway through due to c/o chest pressure/pinching. Possibly due to tele lead/sticker, RN notified and OK for him to walk back to room after discomfort subsided, no concerns per monitoring dept when called.   Stairs Stairs: Yes Stairs assistance: Min assist Stair Management: One rail Right, Step to pattern, Forwards Number of Stairs: 3 General stair comments: pt c/o moderate fatigue after performing stairs and  3/4 DOE. Pt then c/o L chest pressure and "pinching" sensation so RN called to assess and chair pulled up for him to sit. RN thinks it is related to tele lead sticker, and pt states sensation improves with  seated break.   Wheelchair Mobility     Tilt Bed    Modified Rankin (Stroke Patients Only)       Balance Overall balance assessment: History of Falls, Needs assistance Sitting-balance support: No upper extremity supported, Feet supported Sitting balance-Leahy Scale: Good Sitting balance - Comments: pt sitting EOB to eat his lunch tray   Standing balance support: Bilateral upper extremity supported, Reliant on assistive device for balance Standing balance-Leahy Scale: Poor Standing balance comment: RW reliant                            Cognition Arousal: Alert Behavior During Therapy: WFL for tasks assessed/performed Overall Cognitive Status: No family/caregiver present to determine baseline cognitive functioning                                 General Comments: min cues for activity pacing/safety when pt c/o L chest pinching sensation, pt following cues well throughout, mild decreased safety with transfers needs reminders for safe UE placement each transfer.        Exercises      General Comments General comments (skin integrity, edema, etc.): pt reports he uses 4L O2 [place] baseline resting and with exertion. HR 70's bpm resting and SpO2 94-96% resting and with activity on 4L/min O2 [place].      Pertinent Vitals/Pain Pain Assessment Pain Assessment: Faces Faces Pain Scale: Hurts a little bit Pain Location: L chest "pressure" and "pinching" pt pointing to area where L tele sticker located, RN called over to assess Pain Descriptors / Indicators: Grimacing, Pressure Pain Intervention(s): Limited activity within patient's tolerance, Monitored during session, Repositioned, Other (comment) (seated break on chair in hallway when pt c/o, RN called to assess, tele called and report no concerns, per RN OK to walk back to room and RN to replace tele lead stickers after gait trial.)    Home Living                          Prior Function             PT Goals (current goals can now be found in the care plan section) Acute Rehab PT Goals Patient Stated Goal: go home when medically ready PT Goal Formulation: With patient Time For Goal Achievement: 07/19/23 Progress towards PT goals: Progressing toward goals    Frequency    Min 1X/week      PT Plan      Co-evaluation              AM-PAC PT "6 Clicks" Mobility   Outcome Measure  Help needed turning from your back to your side while in a flat bed without using bedrails?: None Help needed moving from lying on your back to sitting on the side of a flat bed without using bedrails?: A Little Help needed moving to and from a bed to a chair (including a wheelchair)?: A Little Help needed standing up from a chair using your arms (e.g., wheelchair or bedside chair)?: A Little Help needed to walk in hospital room?: A Little Help needed climbing 3-5 steps with a railing? :  A Little 6 Click Score: 19    End of Session Equipment Utilized During Treatment: Gait belt;Oxygen Activity Tolerance: Patient tolerated treatment well;Treatment limited secondary to medical complications (Comment);Other (comment);Patient limited by pain (c/o L chest pressure which improved with seated break) Patient left: in bed;with bed alarm set;with call bell/phone within reach;Other (comment) (pt sitting EOB to eat) Nurse Communication: Mobility status;Other (comment) (L chest pressure/pinching sensation, tele screen states "PVCs" around the same time) PT Visit Diagnosis: Unsteadiness on feet (R26.81);Muscle weakness (generalized) (M62.81);Repeated falls (R29.6)     Time: 1202-1225 PT Time Calculation (min) (ACUTE ONLY): 23 min  Charges:    $Gait Training: 8-22 mins $Therapeutic Activity: 8-22 mins PT General Charges $$ ACUTE PT VISIT: 1 Visit                     Reeve Mallo P., PTA Acute Rehabilitation Services Secure Chat Preferred 9a-5:30pm Office: [phone]    Dorathy Kinsman [facility] 07/07/2023,  1:17 PM

## 2023-07-07 NOTE — Plan of Care (Signed)
  Problem: Nutritional: Goal: Maintenance of adequate nutrition will improve Outcome: Progressing   Problem: Tissue Perfusion: Goal: Adequacy of tissue perfusion will improve Outcome: Progressing   Problem: Activity: Goal: Risk for activity intolerance will decrease Outcome: Progressing   Problem: Nutrition: Goal: Adequate nutrition will be maintained Outcome: Progressing

## 2023-07-07 NOTE — TOC CM/SW Note (Signed)
Transition of Care [facility]) - Inpatient Brief Assessment   Patient Details  Name: Bradley PENNISON Sr. MRN: [id number] Date of Birth: 1941/06/11  Transition of Care [facility]) CM/SW Contact:    Tom-Johnson, Hershal Coria, RN Phone Number: 07/07/2023, 2:33 PM   Clinical Narrative:  Patient presented to the ED with Confusion and  Increased Generalized Weakness. Patient had a witnessed fall at home prior to symptoms. Admitted with Acute Encephalopathy, patient is currently A&O X4 at this time.  Patient's wife is critically ill on the 3rd floor in this Hospital and patient voiced his concern about her wellbeing, wants her to be comfortable.  Patient transitioned from IV abx to oral, has wounds to Bilateral Lower Legs.   From home, lives with his granddaughter. Had one son whom is deceased and two grandchildren.   Does not drive, grandchildren transports to and from appointments.  Has a cane, walker, Lift Chair and home O2 from [place].  PCP is [name], MD and uses [employer] in [place] and [place].  Home Health recommended, patient states he had home health before and does not remember the name of Agency and has no preference at this time.  CM called in referral to [facility] and Tresa Endo voiced acceptance, info on AVS.   CM will continue to follow as patient progresses with care towards discharge.       Transition of Care Asessment: Insurance and Status: Insurance coverage has been reviewed Patient has primary care physician: Yes Home environment has been reviewed: Yes Prior level of function:: Modified Independent Prior/Current Home Services: No current home services Social Determinants of Health Reivew: SDOH reviewed no interventions necessary Readmission risk has been reviewed: Yes Transition of care needs: transition of care needs identified, TOC will continue to follow

## 2023-07-07 NOTE — Progress Notes (Addendum)
PROGRESS NOTE  Bradley [name] Sr.  NWG:[id number] DOB: 1940-10-26 DOA: 07/04/2023 PCP: [name], MD   Brief Narrative: Patient is a 82 year old male with history of COPD, chronic hypoxic/hypercarbic respiratory failure, diastolic CHF, hypertension, type 2 diabetes, coronary artery disease ,atrial fibrillation status post ablation who presented from home with complaint of intermittent confusion, generalized weakness, multiple falls.  Report of fall twice in the last week, progressive forgetfulness, confusion, weakness.  On presentation he was confused, very weak.  CT head, cervical spine CT did not show any acute findings.  Lab work showed a creatinine of 1.2, WC count of 20.  Patient started on antibiotics for right lower extremity cellulitis.  Blood cultures now showing Pasturella Multocida.  ID consulted .patient has clinically improved.  Assessment & Plan:  Principal Problem:   Acute encephalopathy Active Problems:   Type 2 diabetes mellitus (HCC)   Essential hypertension   Atrial fibrillation (HCC)   Chronic respiratory failure with hypoxia and hypercapnia (HCC)   Chronic diastolic heart failure (HCC)   COPD,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   CAD (coronary artery disease)   AKI (acute kidney injury) (HCC)   Cellulitis of right leg   Gram-negative bacteremia/Right lower extremity cellulitis: Started on ceftriaxone.  Leukocytosis resolved.  Right lower extremity cellulitis looks significantly better today.  Sepsis physiology has resolved.Blood cultures now showing Pasturella Multocida.  ID consulted  Acute encephalopathy: Presented with somnolence, generalized weakness, confusion.  CT head did not show any acute findings but shows generalized atrophy, chronic right parieto-occipital infarct.  Encephalopathy could be secondary to AKI or sepsis from cellulitis.  Ammonia level normal.  TSH level normal.  ABG did not show any hypercarbia. Encephalopathy has resolved, currently he is he is alert and oriented.    AKI: Started on gentle IV fluids.  AKI resolved.  Lasix and Farxiga on hold.  Chest pain /Coronary artery  disease:  On aspirin, Lipitor at home.  Complained of left-sided chest pressure on the morning of 9/25 .  Troponins mild elevated but flat trend.  Not convinced with possibility of ACS.  EKG did not show any acute ischemic changes.  Started on Protonix for possible GERD  COPD: Currently not in exacerbation.  On supplemental oxygen at 4 L at home.  Currently on the same.  Patient is a past smoker  Hypertension: Takes amlodipine, bisoprolol, Lasix at home.  These medicines are on hold due to soft blood pressure  Type 2 diabetes: Recent A1c of 6.3.  Currently on sliding scale  Hypokalemia: Supplemented with  potassium and corrected  Debility/deconditioning: PT/OT consulted,recommended HH.  He ambulates with the help of walker/cane, lives with his grandson and granddaughter      DVT prophylaxis:enoxaparin (LOVENOX) injection 40 mg Start: 07/05/23 1000     Code Status: Full Code  Family Communication:called and discussed with granddaughter Hospital doctor on phone on 9/23  Patient status:Obs  Patient is from :Home  Anticipated discharge VH:QION   Estimated DC date: Likely tomorrow, awaiting ID recommendation.  Patient also does not feel comfortable to go home today   Consultants: ID  Procedures:none  Antimicrobials:  Anti-infectives (From admission, onward)    Start     Dose/Rate Route Frequency Ordered Stop   07/05/23 2200  cefTRIAXone (ROCEPHIN) 2 g in sodium chloride 0.9 % 100 mL IVPB        2 g 200 mL/hr over 30 Minutes Intravenous Daily at bedtime 07/05/23 0807 07/11/23 2159   07/04/23 2315  cefTRIAXone (ROCEPHIN) 1 g in  sodium chloride 0.9 % 100 mL IVPB  Status:  Discontinued        1 g 200 mL/hr over 30 Minutes Intravenous Daily at bedtime 07/04/23 2301 07/05/23 0807   07/04/23 2230  doxycycline (VIBRA-TABS) tablet 100 mg        100 mg Oral  Once 07/04/23 2226 07/04/23  2240       Subjective: Patient seen and examined at bedside today.  Hemodynamically stable.  He is chest pressure has improved this morning but he is still complains of some discomfort when he takes deep breath.  On baseline oxygen requirement.  Right lower extremity cellulitis looks better.  He is afebrile.  He is slightly hesitant about going home today.  His wife is still sick and being currently managed at this hospital  Objective: Vitals:   07/06/23 1745 07/06/23 2047 07/07/23 0434 07/07/23 0906  BP: 126/67 114/62 131/75 (P) 134/80  Pulse: 70 70 72 (P) 77  Resp:  (!) 23 20   Temp: 99.7 F (37.6 C) 98.1 F (36.7 C) 98.3 F (36.8 C) (P) 98 F (36.7 C)  TempSrc:  Oral  (P) Oral  SpO2: 93% 96% 99% (P) 98%  Weight:   77.3 kg   Height:        Intake/Output Summary (Last 24 hours) at 07/07/2023 1140 Last data filed at 07/07/2023 0916 Gross per 24 hour  Intake 480 ml  Output 1001 ml  Net -521 ml   Filed Weights   07/04/23 1606 07/05/23 0500 07/07/23 0434  Weight: 83.9 kg 83.9 kg 77.3 kg    Examination:  General exam: Overall comfortable, not in distress, pleasant elderly gentleman HEENT: PERRL Respiratory system:  no wheezes or crackles  Cardiovascular system: S1 & S2 heard, RRR.  Gastrointestinal system: Abdomen is nondistended, soft and nontender. Central nervous system: Alert and oriented Extremities: No edema, no clubbing ,no cyanosis Skin: No rashes, no ulcers,no icterus   Skin:    Erythema, mild edema of right lower extremity with chronic venous stasis changes, shallow ulcer on the ventral surface of right leg.  Overall picture improving   Data Reviewed: I have personally reviewed following labs and imaging studies  CBC: Recent Labs  Lab 07/04/23 1630 07/04/23 2323 07/06/23 0504  WBC 20.3* 18.6* 9.8  NEUTROABS 17.6*  --   --   HGB 13.5 13.5 13.7  HCT 40.6 40.5 41.7  MCV 96.2 96.9 93.9  PLT 154 151 145*   Basic Metabolic Panel: Recent Labs  Lab  07/04/23 1630 07/04/23 2323 07/06/23 0504 07/07/23 0538  NA 138 138 138 138  K 4.0 3.4* 3.4* 4.8  CL 101 101 104 107  CO2 25 28 25 26   GLUCOSE 115* 103* 101* 110*  BUN 22 23 15  7*  CREATININE 1.26* 1.11 0.75 0.76  CALCIUM 8.4* 8.3* 8.3* 8.2*  MG  --  2.1  --   --   PHOS  --  3.1  --   --      Recent Results (from the past 240 hour(s))  Culture, blood (Routine X 2) w Reflex to ID Panel     Status: Abnormal   Collection Time: 07/04/23 11:23 PM   Specimen: BLOOD RIGHT ARM  Result Value Ref Range Status   Specimen Description BLOOD RIGHT ARM  Final   Special Requests   Final    BOTTLES DRAWN AEROBIC AND ANAEROBIC Blood Culture adequate volume   Culture  Setup Time   Final    GRAM NEGATIVE RODS  IN BOTH AEROBIC AND ANAEROBIC BOTTLES CRITICAL RESULT CALLED TO, READ BACK BY AND VERIFIED WITH: PHARMD ANDREW MEYER ON 07/05/23 @ 1908 BY DRT    Culture (A)  Final    PASTEURELLA MULTOCIDA Usually susceptible to penicillin and other beta lactam agents,quinolones,macrolides and tetracyclines. Performed at [facility], [address], [place], [place] [address]    Report Status 07/07/2023 FINAL  Final  Culture, blood (Routine X 2) w Reflex to ID Panel     Status: Abnormal   Collection Time: 07/04/23 11:23 PM   Specimen: BLOOD LEFT ARM  Result Value Ref Range Status   Specimen Description BLOOD LEFT ARM  Final   Special Requests   Final    BOTTLES DRAWN AEROBIC AND ANAEROBIC Blood Culture adequate volume   Culture  Setup Time   Final    GRAM NEGATIVE RODS IN BOTH AEROBIC AND ANAEROBIC BOTTLES CRITICAL VALUE NOTED.  VALUE IS CONSISTENT WITH PREVIOUSLY REPORTED AND CALLED VALUE.    Culture (A)  Final    PASTEURELLA MULTOCIDA Usually susceptible to penicillin and other beta lactam agents,quinolones,macrolides and tetracyclines. Performed at [facility], [address], [place], [place] [address]    Report Status 07/07/2023 FINAL  Final  Blood Culture ID Panel  (Reflexed)     Status: None   Collection Time: 07/04/23 11:23 PM  Result Value Ref Range Status   Enterococcus faecalis NOT DETECTED NOT DETECTED Final   Enterococcus Faecium NOT DETECTED NOT DETECTED Final   Listeria monocytogenes NOT DETECTED NOT DETECTED Final   Staphylococcus species NOT DETECTED NOT DETECTED Final   Staphylococcus aureus (BCID) NOT DETECTED NOT DETECTED Final   Staphylococcus epidermidis NOT DETECTED NOT DETECTED Final   Staphylococcus lugdunensis NOT DETECTED NOT DETECTED Final   Streptococcus species NOT DETECTED NOT DETECTED Final   Streptococcus agalactiae NOT DETECTED NOT DETECTED Final   Streptococcus pneumoniae NOT DETECTED NOT DETECTED Final   Streptococcus pyogenes NOT DETECTED NOT DETECTED Final   A.calcoaceticus-baumannii NOT DETECTED NOT DETECTED Final   Bacteroides fragilis NOT DETECTED NOT DETECTED Final   Enterobacterales NOT DETECTED NOT DETECTED Final   Enterobacter cloacae complex NOT DETECTED NOT DETECTED Final   Escherichia coli NOT DETECTED NOT DETECTED Final   Klebsiella aerogenes NOT DETECTED NOT DETECTED Final   Klebsiella oxytoca NOT DETECTED NOT DETECTED Final   Klebsiella pneumoniae NOT DETECTED NOT DETECTED Final   Proteus species NOT DETECTED NOT DETECTED Final   Salmonella species NOT DETECTED NOT DETECTED Final   Serratia marcescens NOT DETECTED NOT DETECTED Final   Haemophilus influenzae NOT DETECTED NOT DETECTED Final   Neisseria meningitidis NOT DETECTED NOT DETECTED Final   Pseudomonas aeruginosa NOT DETECTED NOT DETECTED Final   Stenotrophomonas maltophilia NOT DETECTED NOT DETECTED Final   Candida albicans NOT DETECTED NOT DETECTED Final   Candida auris NOT DETECTED NOT DETECTED Final   Candida glabrata NOT DETECTED NOT DETECTED Final   Candida krusei NOT DETECTED NOT DETECTED Final   Candida parapsilosis NOT DETECTED NOT DETECTED Final   Candida tropicalis NOT DETECTED NOT DETECTED Final   Cryptococcus neoformans/gattii  NOT DETECTED NOT DETECTED Final    Comment: Performed at [facility], [address], [place], [place] [address]     Radiology Studies: No results found.  Scheduled Meds:  enoxaparin (LOVENOX) injection  40 mg Subcutaneous Daily   insulin aspart  0-5 Units Subcutaneous QHS   insulin aspart  0-6 Units Subcutaneous TID WC   pantoprazole  40 mg  Oral Daily   sodium chloride flush  3 mL Intravenous Q12H   Continuous Infusions:  cefTRIAXone (ROCEPHIN)  IV 2 g (07/06/23 2237)     LOS: 2 days   Burnadette Pop, MD [facility] P9/26/2024, 11:40 AM

## 2023-07-07 NOTE — Consult Note (Signed)
[facility]    Date of Admission:  07/04/2023     Total days of antibiotics 4               Reason for Consult: Bacteremia   Referring Provider: Dr. Renford Dills  Primary Care Provider: [name], MD   ASSESSMENT:  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 No obviously infected wounds but does have a dog that has licked his wounds on his legs. Currently on day 3 of ceftriaxone and would recommend 10 days of treatment. Continue ceftriaxone through tomorrow 07/08/23 and then transition to Augmentin 875/125 mg until 10/3. Continue basic wound and instructed to cover wounds to prevent dog from licking them. Remaining medical and supportive care per Internal Medicine.  PLAN:  Continue current dose of ceftriaxone through tomorrow, 07/08/23. Change to Augmentin 875/125 mg until 10/3. Basic wound care as needed.  Remaining medical and supportive care per Internal Medicine.    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 is a 82 y.o. male with previous medical history of COPD on 4 L [place], BPH, CAD, heart failure, hypertension and Type 2 diabetes brought to the ED following a witnessed fall and no loss of consciousness.  [name] experienced a fall prior to arrival with no loss of  consciousness. Chest x-ray with no evidence of trauma and possibly pulmonary edema with left sided pacemaker in place. CT head and cervical spine with no acute findings. Afebrile since admission and initial WBC of 18.6. Ceftriaxone was started for right leg cellulitis. Blood cultures have turned positive for Pasturella multocida.   [name] has been feeling better since arriving to the hospital and had noted multiple falls recently and increasing weakness. No fevers or chills. Has a pet dog that likes to lick him. Tolerating ceftriaxone with no adverse side effects. Has a couple of wounds on his bilateral lower legs. Wife is also currently in the hospital and he has concern for her as well.    Review of Systems: Review of Systems  Constitutional:  Positive for malaise/fatigue. Negative for chills, fever and weight loss.  Respiratory:  Negative for cough, shortness of breath and wheezing.   Cardiovascular:  Negative for chest pain and leg swelling.  Gastrointestinal:  Negative for abdominal pain, constipation, diarrhea, nausea and vomiting.  Skin:  Negative for rash.     Past Medical History:  Diagnosis Date   Abnormal CT scan, chest 2012   CT chest, several lymphadenopathies. Sees pulmonary   Anemia    intermittent   Arthritis    "?back" (09/19/2018)   Atrial fibrillation (HCC)    s/p AV node ablation & BiV PPM implantation 09/08/11 (op dictation pending)   BPH (  benign prostatic hyperplasia)    Saw Dr Bradley [name] 2004, normal renal u/s   Bronchitis 08/26/2017   CAD (coronary artery disease)    CHF (congestive heart failure) (HCC)    Thought primarily to be non-systolic although EF down (EF 44-03% 12/2010, down to 35-40% 09/05/11), cath 2008 with no CAD, nuclear study 07/2011 showing Small area of reversibility in the distal ant/lat wall the left ventricle suspicious for ischemia/septal wall HK but felt to be low risk  (per D/C Summary 07/2011)   Chronic bronchitis (HCC)    "get it ~ q  yr"   Chronic diastolic heart failure (HCC)         Chronic respiratory failure with hypoxia and hypercapnia (HCC) 09/14/2010   Followed in Pulmonary clinic/ [facility]/ [facility]  Started on 02 2lpm at discharge 09/23/11   - PFT's 10/28/2011  FEV1  1.40 (51%)  with ratio 70 and no better p B2 and DLCO 53 corrects to 101    - PFT's 10/18/2014    FEV1 1.71 (59%) with ratio 68 and no sign change p B2 and DLCO 53 corrects to 85  -  HC03   07/28/20  = 33  -  HCO3   03/17/21     = 31     Heart murmur    HLD (hyperlipidemia)    HTN (hypertension)    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   Pleural effusion 2008   S/p decortication   Pneumonia 08/02/2011   Pulmonary HTN (HCC)    per cath 2008   Type II diabetes mellitus (HCC) 1999    Social History   Tobacco Use   Smoking status: Former    Current packs/day: 0.00    Average packs/day: 2.5 packs/day for 25.0 years (62.5 ttl pk-yrs)    Types: Cigarettes, Cigars    Start date: 10/12/1955    Quit date: 10/11/1980    Years since quitting: 42.7   Smokeless tobacco: Never  Vaping Use   Vaping status: Never Used  Substance Use Topics   Alcohol use: Not Currently    Comment: 1 can of beer ever 4-5 months   Drug use: No    Family History  Problem Relation Age of Onset   Diabetes Father    Coronary artery disease Father    Polycythemia Mother    Drug abuse Son    Breast cancer Maternal Aunt    Prostate cancer Neg Hx    Colon cancer Neg Hx     Allergies  Allergen Reactions   Hydrocodone Other (See Comments)    "given to him in the hospital; went thru withdrawals once home; dr said not to take it again" (09/27/2012) dizziness   Ultram [Tramadol] Other (See Comments)    "given to him in the hospital; went thru withdrawals once home; dr said not to take it  again" (09/27/2012) dizziness   Cialis [Tadalafil] Other (See Comments)    Headache Backache  Dizziness    Avelox [Moxifloxacin Hydrochloride] Itching and Other (See Comments)    Headache Dizziness    OBJECTIVE: Blood pressure (P) 134/80, pulse (P) 77, temperature (P) 98 F (36.7 C), temperature source (P) Oral, resp. rate 20, height 5\' 9"  (1.753 m), weight 77.3 kg, SpO2 (P)  98%.  Physical Exam Constitutional:      General: He is not in acute distress.    Appearance: He is well-developed.  Cardiovascular:     Rate and Rhythm: Normal rate and regular rhythm.     Heart sounds: Normal heart sounds.  Pulmonary:     Effort: Pulmonary effort is normal.     Breath sounds: Normal breath sounds.  Skin:    General: Skin is warm and dry.     Comments: Wound on right lower leg appears with scab and healing without clear evidence of infection. Open wound left knee. Venous stasis present.   Neurological:     Mental Status: He is alert and oriented to person, place, and time.  Psychiatric:        Mood and Affect: Mood normal.     Lab Results Lab Results  Component Value Date   WBC 9.8 07/06/2023   HGB 13.7 07/06/2023   HCT 41.7 07/06/2023   MCV 93.9 07/06/2023   PLT 145 (L) 07/06/2023    Lab Results  Component Value Date   CREATININE 0.76 07/07/2023   BUN 7 (L) 07/07/2023   NA 138 07/07/2023   K 4.8 07/07/2023   CL 107 07/07/2023   CO2 26 07/07/2023    Lab Results  Component Value Date   ALT 18 07/04/2023   AST 32 07/04/2023   ALKPHOS 47 07/04/2023   BILITOT 1.5 (H) 07/04/2023     Microbiology: Recent Results (from the past 240 hour(s))  Culture, blood (Routine X 2) w Reflex to ID Panel     Status: Abnormal   Collection Time: 07/04/23 11:23 PM   Specimen: BLOOD RIGHT ARM  Result Value Ref Range Status   Specimen Description BLOOD RIGHT ARM  Final   Special Requests   Final    BOTTLES DRAWN AEROBIC AND ANAEROBIC Blood Culture adequate volume   Culture  Setup Time    Final    GRAM NEGATIVE RODS IN BOTH AEROBIC AND ANAEROBIC BOTTLES CRITICAL RESULT CALLED TO, READ BACK BY AND VERIFIED WITH: PHARMD ANDREW MEYER ON 07/05/23 @ 1908 BY DRT    Culture (A)  Final    PASTEURELLA MULTOCIDA Usually susceptible to penicillin and other beta lactam agents,quinolones,macrolides and tetracyclines. Performed at [facility], [address] [place], [place] [address]    Report Status 07/07/2023 FINAL  Final  Culture, blood (Routine X 2) w Reflex to ID Panel     Status: Abnormal   Collection Time: 07/04/23 11:23 PM   Specimen: BLOOD LEFT ARM  Result Value Ref Range Status   Specimen Description BLOOD LEFT ARM  Final   Special Requests   Final    BOTTLES DRAWN AEROBIC AND ANAEROBIC Blood Culture adequate volume   Culture  Setup Time   Final    GRAM NEGATIVE RODS IN BOTH AEROBIC AND ANAEROBIC BOTTLES CRITICAL VALUE NOTED.  VALUE IS CONSISTENT WITH PREVIOUSLY REPORTED AND CALLED VALUE.    Culture (A)  Final    PASTEURELLA MULTOCIDA Usually susceptible to penicillin and other beta lactam agents,quinolones,macrolides and tetracyclines. Performed at [facility], [address], [place], [place] [address]    Report Status 07/07/2023 FINAL  Final  Blood Culture ID Panel (Reflexed)     Status: None   Collection Time: 07/04/23 11:23 PM  Result Value Ref Range Status   Enterococcus faecalis NOT DETECTED NOT DETECTED Final   Enterococcus Faecium NOT DETECTED NOT DETECTED Final   Listeria monocytogenes NOT DETECTED  NOT DETECTED Final   Staphylococcus species NOT DETECTED NOT DETECTED Final   Staphylococcus aureus (BCID) NOT DETECTED NOT DETECTED Final   Staphylococcus epidermidis NOT DETECTED NOT DETECTED Final   Staphylococcus lugdunensis NOT DETECTED NOT DETECTED Final   Streptococcus species NOT DETECTED NOT DETECTED Final   Streptococcus agalactiae NOT DETECTED NOT DETECTED Final   Streptococcus pneumoniae NOT DETECTED NOT DETECTED Final    Streptococcus pyogenes NOT DETECTED NOT DETECTED Final   A.calcoaceticus-baumannii NOT DETECTED NOT DETECTED Final   Bacteroides fragilis NOT DETECTED NOT DETECTED Final   Enterobacterales NOT DETECTED NOT DETECTED Final   Enterobacter cloacae complex NOT DETECTED NOT DETECTED Final   Escherichia coli NOT DETECTED NOT DETECTED Final   Klebsiella aerogenes NOT DETECTED NOT DETECTED Final   Klebsiella oxytoca NOT DETECTED NOT DETECTED Final   Klebsiella pneumoniae NOT DETECTED NOT DETECTED Final   Proteus species NOT DETECTED NOT DETECTED Final   Salmonella species NOT DETECTED NOT DETECTED Final   Serratia marcescens NOT DETECTED NOT DETECTED Final   Haemophilus influenzae NOT DETECTED NOT DETECTED Final   Neisseria meningitidis NOT DETECTED NOT DETECTED Final   Pseudomonas aeruginosa NOT DETECTED NOT DETECTED Final   Stenotrophomonas maltophilia NOT DETECTED NOT DETECTED Final   Candida albicans NOT DETECTED NOT DETECTED Final   Candida auris NOT DETECTED NOT DETECTED Final   Candida glabrata NOT DETECTED NOT DETECTED Final   Candida krusei NOT DETECTED NOT DETECTED Final   Candida parapsilosis NOT DETECTED NOT DETECTED Final   Candida tropicalis NOT DETECTED NOT DETECTED Final   Cryptococcus neoformans/gattii NOT DETECTED NOT DETECTED Final    Comment: Performed at [facility], [address] [place], [place] [address]     Marcos Eke, NP [facility]  07/07/2023  10:52 AM

## 2023-07-07 NOTE — Plan of Care (Signed)
Problem: Coping: Goal: Psychosocial and spiritual needs will be supported Outcome: Progressing   Problem: Respiratory: Goal: Will maintain a patent airway Outcome: Progressing Goal: Complications related to the disease process, condition or treatment will be avoided or minimized Outcome: Progressing

## 2023-07-08 DIAGNOSIS — G9340 Encephalopathy, unspecified: Secondary | ICD-10-CM | POA: Diagnosis not present

## 2023-07-08 LAB — GLUCOSE, CAPILLARY
Glucose-Capillary: 118 mg/dL — ABNORMAL HIGH (ref 70–99)
Glucose-Capillary: 99 mg/dL (ref 70–99)
Glucose-Capillary: 117 mg/dL — ABNORMAL HIGH (ref 70–99)
Glucose-Capillary: 113 mg/dL — ABNORMAL HIGH (ref 70–99)

## 2023-07-08 LAB — TROPONIN I (HIGH SENSITIVITY)
Troponin I (High Sensitivity): 12 ng/L (ref <18)
Troponin I (High Sensitivity): 15 ng/L (ref <18)

## 2023-07-08 NOTE — Progress Notes (Signed)
PROGRESS NOTE  Bradley RICHMAN Sr.  XBM:[id number] DOB: [birth year] DOA: 07/04/2023 PCP: Bradley Plump, MD   Brief Narrative: Patient is a 82 year old male with history of COPD, chronic hypoxic/hypercarbic respiratory failure, diastolic CHF, hypertension, type 2 diabetes, coronary artery disease ,atrial fibrillation status post ablation who presented from home with complaint of intermittent confusion, generalized weakness, multiple falls.  Report of fall twice in the last week, progressive forgetfulness, confusion, weakness.  On presentation ,he was confused, very weak.  CT head, cervical spine CT did not show any acute findings.  Lab work showed a creatinine of 1.2, WC count of 20.  Patient started on antibiotics for right lower extremity cellulitis.  Blood cultures showed  Pasturella Multocida.  ID consulted .patient has clinically improved.  Patient's wife is also admitted here.  Complains of some chest pain today.  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  Leukocytosis resolved.  Right lower extremity cellulitis looks significantly better today.  Sepsis physiology has resolved.Blood cultures showed Pasturella Multocida.  ID consulted, recommended to continue ceftriaxone until discharge.  On dc ,he will be on Augmentin through 10/3  Acute encephalopathy: Presented with somnolence, generalized weakness, confusion.  CT head did not show any acute findings but shows generalized atrophy, chronic right parieto-occipital infarct.   Encephalopathy could be secondary to AKI or sepsis from cellulitis.  Ammonia level normal.  TSH level normal.  ABG did not show any hypercarbia. Encephalopathy has resolved, currently he is he is alert and oriented.   AKI: Started on gentle IV fluids.  AKI resolved.  Lasix and Farxiga on hold.  Chest pain /Coronary artery  disease:  On aspirin, Lipitor at home.  Complained of left-sided chest pressure on the morning of 9/25 .  Troponins mild elevated but flat trend.  Not convinced with possibility of ACS.  EKG did not show any acute ischemic changes.  Started on Protonix for possible GERD. He again complained of chest pressure/pain today.  His chest pain is reproducible, associated with deep breath.  This is most likely atypical chest pain.  But will again check troponin, echocardiogram  COPD: Currently not in exacerbation.  On supplemental oxygen at 4 L at home.  Currently on the same.  Patient is a past smoker  Hypertension: Takes amlodipine, bisoprolol, Lasix at home.  These medicines are on hold due to soft blood pressure  Type 2 diabetes: Recent A1c of 6.3.  Currently on sliding scale  Hypokalemia: Supplemented with  potassium and corrected  Debility/deconditioning: PT/OT consulted,recommended HH.  He ambulates with the help of walker/cane, lives with his grandson and granddaughter      DVT prophylaxis:enoxaparin (LOVENOX) injection 40 mg Start: 07/05/23 1000     Code Status: Full Code  Family Communication:called and discussed with grandson on phonw on 9/27  Patient status:Inpatient  Patient is from :Home  Anticipated discharge UU:VOZD   Estimated DC date: Likely tomorrow.  Patient does not feel ready to go home today.  His wife has been admitted here and is  really sick.  No  support at home for today.  Also working on chest pain rule out ACS  Consultants: ID  Procedures:none  Antimicrobials:  Anti-infectives (From admission, onward)    Start     Dose/Rate Route  Frequency Ordered Stop   07/08/23 1000  amoxicillin-clavulanate (AUGMENTIN) 875-125 MG per tablet 1 tablet        1 tablet Oral Every 12 hours 07/07/23 1517 07/11/23 2159   07/05/23 2200  cefTRIAXone (ROCEPHIN) 2 g in sodium chloride 0.9 % 100 mL IVPB        2 g 200 mL/hr over 30 Minutes Intravenous Daily at bedtime 07/05/23 0807 07/07/23 2153   07/04/23 2315  cefTRIAXone (ROCEPHIN) 1 g in sodium chloride 0.9 % 100 mL IVPB  Status:  Discontinued        1 g 200 mL/hr over 30 Minutes Intravenous Daily at bedtime 07/04/23 2301 07/05/23 0807   07/04/23 2230  doxycycline (VIBRA-TABS) tablet 100 mg        100 mg Oral  Once 07/04/23 2226 07/04/23 2240       Subjective: Patient seen and examined at bedside today.  Hemodynamically stable.  Complains of some chest discomfort/pressure again today.  Pain is reproducible.  He does not feel ready to go home and was requesting to stay 1 more day  Objective: Vitals:   07/07/23 2114 07/08/23 0500 07/08/23 0550 07/08/23 0917  BP: (!) 143/80  (!) 146/82 127/73  Pulse: 74  71 70  Resp: 20  20 18   Temp: 98.4 F (36.9 C)  98.3 F (36.8 C) 97.9 F (36.6 C)  TempSrc:      SpO2: 100%  100% 95%  Weight:  77.5 kg    Height:        Intake/Output Summary (Last 24 hours) at 07/08/2023 1121 Last data filed at 07/08/2023 0600 Gross per 24 hour  Intake 363 ml  Output 801 ml  Net -438 ml   Filed Weights   07/05/23 0500 07/07/23 0434 07/08/23 0500  Weight: 83.9 kg 77.3 kg 77.5 kg    Examination:  General exam: Overall comfortable, not in distress, pleasant elderly male HEENT: PERRL Respiratory system:  no wheezes or crackles , diminished sounds bilaterally Cardiovascular system: S1 & S2 heard, RRR.  Gastrointestinal system: Abdomen is nondistended, soft and nontender. Central nervous system: Alert and oriented Extremities: No edema, no clubbing ,no cyanosis Skin: Erythema,  chronic venous stasis changes, shallow ulcer on the ventral surface of  right leg.      Data Reviewed: I have personally reviewed following labs and imaging studies  CBC: Recent Labs  Lab 07/04/23 1630 07/04/23 2323 07/06/23 0504  WBC 20.3* 18.6* 9.8  NEUTROABS 17.6*  --   --   HGB 13.5 13.5 13.7  HCT 40.6 40.5 41.7  MCV 96.2 96.9 93.9  PLT 154 151 145*   Basic Metabolic Panel: Recent Labs  Lab 07/04/23 1630 07/04/23 2323 07/06/23 0504 07/07/23 0538  NA 138 138 138 138  K 4.0 3.4* 3.4* 4.8  CL 101 101 104 107  CO2 25 28 25 26   GLUCOSE 115* 103* 101* 110*  BUN 22 23 15  7*  CREATININE 1.26* 1.11 0.75 0.76  CALCIUM 8.4* 8.3* 8.3* 8.2*  MG  --  2.1  --   --   PHOS  --  3.1  --   --      Recent Results (from the past 240 hour(s))  Culture, blood (Routine X 2) w Reflex to  ID Panel     Status: Abnormal   Collection Time: 07/04/23 11:23 PM   Specimen: BLOOD RIGHT ARM  Result Value Ref Range Status   Specimen Description BLOOD RIGHT ARM  Final   Special Requests   Final    BOTTLES DRAWN AEROBIC AND ANAEROBIC Blood Culture adequate volume   Culture  Setup Time   Final    GRAM NEGATIVE RODS IN BOTH AEROBIC AND ANAEROBIC BOTTLES CRITICAL RESULT CALLED TO, READ BACK BY AND VERIFIED WITH: PHARMD ANDREW MEYER ON 07/05/23 @ 1908 BY DRT    Culture (A)  Final    PASTEURELLA MULTOCIDA Usually susceptible to penicillin and other beta lactam agents,quinolones,macrolides and tetracyclines. Performed at [facility], [address], [place], [place] [address]    Report Status 07/07/2023 FINAL  Final  Culture, blood (Routine X 2) w Reflex to ID Panel     Status: Abnormal   Collection Time: 07/04/23 11:23 PM   Specimen: BLOOD LEFT ARM  Result Value Ref Range Status   Specimen Description BLOOD LEFT ARM  Final   Special Requests   Final    BOTTLES DRAWN AEROBIC AND ANAEROBIC Blood Culture adequate volume   Culture  Setup Time   Final    GRAM NEGATIVE RODS IN BOTH AEROBIC AND ANAEROBIC BOTTLES CRITICAL VALUE NOTED.  VALUE IS CONSISTENT WITH  PREVIOUSLY REPORTED AND CALLED VALUE.    Culture (A)  Final    PASTEURELLA MULTOCIDA Usually susceptible to penicillin and other beta lactam agents,quinolones,macrolides and tetracyclines. Performed at [facility], [address] [place], [place] [address]    Report Status 07/07/2023 FINAL  Final  Blood Culture ID Panel (Reflexed)     Status: None   Collection Time: 07/04/23 11:23 PM  Result Value Ref Range Status   Enterococcus faecalis NOT DETECTED NOT DETECTED Final   Enterococcus Faecium NOT DETECTED NOT DETECTED Final   Listeria monocytogenes NOT DETECTED NOT DETECTED Final   Staphylococcus species NOT DETECTED NOT DETECTED Final   Staphylococcus aureus (BCID) NOT DETECTED NOT DETECTED Final   Staphylococcus epidermidis NOT DETECTED NOT DETECTED Final   Staphylococcus lugdunensis NOT DETECTED NOT DETECTED Final   Streptococcus species NOT DETECTED NOT DETECTED Final   Streptococcus agalactiae NOT DETECTED NOT DETECTED Final   Streptococcus pneumoniae NOT DETECTED NOT DETECTED Final   Streptococcus pyogenes NOT DETECTED NOT DETECTED Final   A.calcoaceticus-baumannii NOT DETECTED NOT DETECTED Final   Bacteroides fragilis NOT DETECTED NOT DETECTED Final   Enterobacterales NOT DETECTED NOT DETECTED Final   Enterobacter cloacae complex NOT DETECTED NOT DETECTED Final   Escherichia coli NOT DETECTED NOT DETECTED Final   Klebsiella aerogenes NOT DETECTED NOT DETECTED Final   Klebsiella oxytoca NOT DETECTED NOT DETECTED Final   Klebsiella pneumoniae NOT DETECTED NOT DETECTED Final   Proteus species NOT DETECTED NOT DETECTED Final   Salmonella species NOT DETECTED NOT DETECTED Final   Serratia marcescens NOT DETECTED NOT DETECTED Final   Haemophilus influenzae NOT DETECTED NOT DETECTED Final   Neisseria meningitidis NOT DETECTED NOT DETECTED Final   Pseudomonas aeruginosa NOT DETECTED NOT DETECTED Final   Stenotrophomonas maltophilia NOT DETECTED NOT DETECTED Final   Candida  albicans NOT DETECTED NOT DETECTED Final   Candida auris NOT DETECTED NOT DETECTED Final   Candida glabrata NOT DETECTED NOT DETECTED Final   Candida krusei NOT DETECTED NOT DETECTED Final   Candida parapsilosis NOT DETECTED NOT DETECTED Final   Candida tropicalis NOT DETECTED NOT DETECTED Final  Cryptococcus neoformans/gattii NOT DETECTED NOT DETECTED Final    Comment: Performed at [facility], [address], [place], [place] [address]     Radiology Studies: No results found.  Scheduled Meds:  amoxicillin-clavulanate  1 tablet Oral Q12H   enoxaparin (LOVENOX) injection  40 mg Subcutaneous Daily   insulin aspart  0-5 Units Subcutaneous QHS   insulin aspart  0-6 Units Subcutaneous TID WC   pantoprazole  40 mg Oral Daily   sodium chloride flush  3 mL Intravenous Q12H   Continuous Infusions:     LOS: 3 days   Burnadette Pop, MD [facility] P9/27/2024, 11:21 AM

## 2023-07-08 NOTE — Progress Notes (Signed)
PT Cancellation Note  Patient Details Name: Bradley LEYDA Sr. MRN: [id number] DOB: 04-15-1941   Cancelled Treatment:    Reason Eval/Treat Not Completed: (P) Other (comment) (pt off unit on 3E to visit spouse who is also hospitalized.) Attempted x2   Dorathy Kinsman Katheen Aslin 07/08/2023, 3:22 PM

## 2023-07-09 ENCOUNTER — Other Ambulatory Visit (HOSPITAL_COMMUNITY): Payer: Medicare Other

## 2023-07-09 DIAGNOSIS — I2510 Atherosclerotic heart disease of native coronary artery without angina pectoris: Secondary | ICD-10-CM | POA: Diagnosis not present

## 2023-07-09 DIAGNOSIS — G9340 Encephalopathy, unspecified: Secondary | ICD-10-CM | POA: Diagnosis not present

## 2023-07-09 DIAGNOSIS — N179 Acute kidney failure, unspecified: Secondary | ICD-10-CM | POA: Diagnosis not present

## 2023-07-09 DIAGNOSIS — I4891 Unspecified atrial fibrillation: Secondary | ICD-10-CM | POA: Diagnosis not present

## 2023-07-09 DIAGNOSIS — I5032 Chronic diastolic (congestive) heart failure: Secondary | ICD-10-CM

## 2023-07-09 LAB — POTASSIUM: Potassium: 4.3 mmol/L (ref 3.5–5.1)

## 2023-07-09 LAB — GLUCOSE, CAPILLARY
Glucose-Capillary: 154 mg/dL — ABNORMAL HIGH (ref 70–99)
Glucose-Capillary: 85 mg/dL (ref 70–99)
Glucose-Capillary: 86 mg/dL (ref 70–99)
Glucose-Capillary: 121 mg/dL — ABNORMAL HIGH (ref 70–99)

## 2023-07-09 LAB — MAGNESIUM: Magnesium: 1.9 mg/dL (ref 1.7–2.4)

## 2023-07-09 NOTE — Progress Notes (Signed)
Physical Therapy Treatment Patient Details Name: Bradley CANDELARIA Sr. MRN: [id number] DOB: November 03, 1940 Today's Date: 07/09/2023   History of Present Illness 82yo male who presented to [facility] 07/04/23 due to intermittent confusion, weakness, multiple falls. Imaging clear of acute injury. Admit with acute encephalopathy, AKI, RLE cellulitis. PMH: OA, Afib, CAD, CHF, HLD, HTN, chronic diastolic heart failure, chronic respiratory failure, hx intracranial bleed 2013, home O2 use, pacemaker, PVD, pulmonary HTN, DM, lung decortication, pleural scarification.    PT Comments  Pt required supervision transfers, and CGA amb 250' with RW. Steady gait with RW. Mobilized on 4L with SpO2 94%. Cues for pursed lip breathing during activity. Pt returned to bed at end of session. Current POC remains appropriate.     If plan is discharge home, recommend the following: A little help with walking and/or transfers;Assistance with cooking/housework;Assist for transportation;A little help with bathing/dressing/bathroom;Help with stairs or ramp for entrance   Can travel by private vehicle        Equipment Recommendations  None recommended by PT    Recommendations for Other Services       Precautions / Restrictions Precautions Precautions: Fall;Other (comment) Precaution Comments: watch vitals, has hx of L wrist fx in brace but reports MD told him OK for WB LUE in brace Restrictions Weight Bearing Restrictions: No     Mobility  Bed Mobility Overal bed mobility: Modified Independent                  Transfers Overall transfer level: Needs assistance Equipment used: Rolling walker (2 wheels) Transfers: Sit to/from Stand Sit to Stand: Supervision                Ambulation/Gait Ambulation/Gait assistance: Contact guard assist Gait Distance (Feet): 250 Feet Assistive device: Rolling walker (2 wheels) Gait Pattern/deviations: Step-through pattern, Trunk flexed, Decreased stride length Gait  velocity: decreased Gait velocity interpretation: 1.31 - 2.62 ft/sec, indicative of limited community ambulator   General Gait Details: Cues for pursed lip breathing. Pt with c/o feeling lightheaded. He was gripping RW tightly with shoulders tensed. Cues to relax shoulders and grip, resulting in lightheadedness resolving. Amb on 4L with SpO2 94%.   Stairs             Wheelchair Mobility     Tilt Bed    Modified Rankin (Stroke Patients Only)       Balance Overall balance assessment: History of Falls, Needs assistance Sitting-balance support: No upper extremity supported, Feet supported Sitting balance-Leahy Scale: Good     Standing balance support: Bilateral upper extremity supported, Reliant on assistive device for balance, During functional activity Standing balance-Leahy Scale: Poor                              Cognition Arousal: Alert Behavior During Therapy: WFL for tasks assessed/performed Overall Cognitive Status: No family/caregiver present to determine baseline cognitive functioning                                 General Comments: Appropriate. Follows commands. Tangential. Mild decreased safety.        Exercises      General Comments General comments (skin integrity, edema, etc.): SpO2 in 90s on 4L      Pertinent Vitals/Pain Pain Assessment Pain Assessment: No/denies pain    Home Living  Prior Function            PT Goals (current goals can now be found in the care plan section) Acute Rehab PT Goals Patient Stated Goal: go home when medically ready Progress towards PT goals: Progressing toward goals    Frequency    Min 1X/week      PT Plan      Co-evaluation              AM-PAC PT "6 Clicks" Mobility   Outcome Measure  Help needed turning from your back to your side while in a flat bed without using bedrails?: None Help needed moving from lying on your back to  sitting on the side of a flat bed without using bedrails?: None Help needed moving to and from a bed to a chair (including a wheelchair)?: A Little Help needed standing up from a chair using your arms (e.g., wheelchair or bedside chair)?: A Little Help needed to walk in hospital room?: A Little Help needed climbing 3-5 steps with a railing? : A Little 6 Click Score: 20    End of Session Equipment Utilized During Treatment: Gait belt;Oxygen Activity Tolerance: Patient tolerated treatment well Patient left: in bed;with call bell/phone within reach Nurse Communication: Mobility status PT Visit Diagnosis: Unsteadiness on feet (R26.81);Muscle weakness (generalized) (M62.81);Repeated falls (R29.6)     Time: 1610-9604 PT Time Calculation (min) (ACUTE ONLY): 19 min  Charges:    $Gait Training: 8-22 mins PT General Charges $$ ACUTE PT VISIT: 1 Visit                     Ferd Glassing., PT  Office # [phone]    Ilda Foil 07/09/2023, 10:53 AM

## 2023-07-09 NOTE — Progress Notes (Signed)
PROGRESS NOTE  Bradley TINKER Sr.  IHK:[id number] DOB: [birth year] DOA: 07/04/2023 PCP: [name], MD   Brief Narrative:  Patient is a 82 year old male with history of COPD, chronic hypoxic/hypercarbic respiratory failure, diastolic CHF, hypertension, type 2 diabetes, coronary artery disease ,atrial fibrillation status post ablation presented to the hospital from home with complaint of intermittent confusion, generalized weakness, multiple falls.  Report of fall twice in the last week, progressive forgetfulness, confusion, weakness.  On presentation ,he was confused.  CT head, cervical spine CT did not show any acute findings.  Lab work showed a creatinine of 1.2, WBC count of 20K.  Patient ws started on antibiotics for right lower extremity cellulitis.  Blood cultures showed  Pasturella Multocida.  ID was consulted Patient has clinically improved.  Patient's wife is also admitted here.    Assessment & Plan:  Principal Problem:   Acute encephalopathy Active Problems:   Type 2 diabetes mellitus (HCC)   Essential hypertension   Atrial fibrillation (HCC)   Chronic respiratory failure with hypoxia and hypercapnia (HCC)   Chronic diastolic heart failure (HCC)   COPD,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   CAD (coronary artery disease)   AKI (acute kidney injury) (HCC)   Cellulitis of right leg   Gram-negative bacteremia/Right lower extremity cellulitis:  Patient was started on IV Rocephin.  Leukocytosis including cellulitis has improved.  Sepsis physiology has resolved.  Blood cultures showed Pasturella Multocida.  ID was consulted, recommended to continue ceftriaxone until discharge.  Will continue Augmentin through 07/14/23 on discharge.  Acute metabolic encephalopathy:  Resolved at this time.  Presented with somnolence, generalized weakness, confusion.  CT head did not show any acute findings.  Encephalopathy could be secondary to AKI or sepsis from cellulitis.  Ammonia level normal.  TSH level normal.  ABG did not  show any hypercarbia.   AKI: Improved after IV fluids.  Latest creatinine of 0.7.  Lasix and Farxiga on hold.  Chest pain /Coronary artery  disease:  On aspirin, Lipitor at home.  Complained of left-sided chest pressure on the morning of 9/25 .  Troponins mild elevated but flat trend.   Started on Protonix for possible GERD.  Has reproducible chest pain.  Troponin was negative.  2D echocardiogram pending.  COPD: Currently not in exacerbation.  On supplemental oxygen at 4 L at home.  Currently on the same.  Patient is a past smoker  Hypertension: Takes amlodipine, bisoprolol, Lasix at home.  Currently on hold due to soft blood pressures.  Plan to resume on discharge  Type 2 diabetes mellitus: Recent hemoglobin A1c of 6.3.  Currently on sliding scale  Hypokalemia: Resolved with replacement.  Debility/deconditioning: PT/OT consulted,recommended HH.  He ambulates with the help of walker/cane, lives with his grandson and granddaughter     DVT prophylaxis:enoxaparin (LOVENOX) injection 40 mg Start: 07/05/23 1000     Code Status: Full Code  Family Communication: None today.  Previous provider had spoken with family 07/08/2023  Patient status:Inpatient  Patient is from :Home  Anticipated discharge VF:[facility]   Estimated DC date:  Likely tomorrow.  Pending 2D echocardiogram.  Consultants:  ID  Procedures: none  Antimicrobials:  Anti-infectives (From admission, onward)    Start     Dose/Rate Route Frequency Ordered Stop   07/08/23 1000  amoxicillin-clavulanate (AUGMENTIN) 875-125 MG per tablet 1 tablet        1 tablet Oral Every 12 hours 07/07/23 1517 07/11/23 2159   07/05/23 2200  cefTRIAXone (ROCEPHIN) 2 g in sodium  chloride 0.9 % 100 mL IVPB        2 g 200 mL/hr over 30 Minutes Intravenous Daily at bedtime 07/05/23 0807 07/07/23 2153   07/04/23 2315  cefTRIAXone (ROCEPHIN) 1 g in sodium chloride 0.9 % 100 mL IVPB  Status:  Discontinued        1 g 200 mL/hr over 30 Minutes  Intravenous Daily at bedtime 07/04/23 2301 07/05/23 0807   07/04/23 2230  doxycycline (VIBRA-TABS) tablet 100 mg        100 mg Oral  Once 07/04/23 2226 07/04/23 2240      Subjective: Today, patient was seen and examined at bedside.  Feels okay.  Denies overt chest pain, shortness of breath fever or chills.    Objective: Vitals:   07/08/23 1953 07/08/23 2025 07/09/23 0547 07/09/23 0726  BP: 138/81 (!) 140/78 (!) 141/77 128/83  Pulse: 74 75 69 71  Resp: 19 17 16    Temp: 98 F (36.7 C)  98.4 F (36.9 C) 98.2 F (36.8 C)  TempSrc: Oral  Oral Oral  SpO2: 100%  95% 98%  Weight:      Height:        Intake/Output Summary (Last 24 hours) at 07/09/2023 0900 Last data filed at 07/09/2023 0932 Gross per 24 hour  Intake 720 ml  Output 875 ml  Net -155 ml   Filed Weights   07/05/23 0500 07/07/23 0434 07/08/23 0500  Weight: 83.9 kg 77.3 kg 77.5 kg    Physical examination:  General:  Average built, not in obvious distress HENT:   No scleral pallor or icterus noted. Oral mucosa is moist.  Chest:  Clear breath sounds.  Diminished breath sounds bilaterally. No crackles or wheezes.  CVS: S1 &S2 heard. No murmur.  Regular rate and rhythm. Abdomen: Soft, nontender, nondistended.  Bowel sounds are heard.   Extremities: No cyanosis, clubbing or edema.  Peripheral pulses are palpable. Psych: Alert, awake and oriented, normal mood CNS:  No cranial nerve deficits.  Power equal in all extremities.   Skin: Warm and dry.  Bilateral lower extremity chronic venous stasis changes, shallow ulcer on the ventral surface of the right leg.   Data Reviewed: I have personally reviewed following labs and imaging studies  CBC: Recent Labs  Lab 07/04/23 1630 07/04/23 2323 07/06/23 0504  WBC 20.3* 18.6* 9.8  NEUTROABS 17.6*  --   --   HGB 13.5 13.5 13.7  HCT 40.6 40.5 41.7  MCV 96.2 96.9 93.9  PLT 154 151 145*   Basic Metabolic Panel: Recent Labs  Lab 07/04/23 1630 07/04/23 2323 07/06/23 0504  07/07/23 0538 07/08/23 2331  NA 138 138 138 138  --   K 4.0 3.4* 3.4* 4.8 4.3  CL 101 101 104 107  --   CO2 25 28 25 26   --   GLUCOSE 115* 103* 101* 110*  --   BUN 22 23 15  7*  --   CREATININE 1.26* 1.11 0.75 0.76  --   CALCIUM 8.4* 8.3* 8.3* 8.2*  --   MG  --  2.1  --   --  1.9  PHOS  --  3.1  --   --   --      Recent Results (from the past 240 hour(s))  Culture, blood (Routine X 2) w Reflex to ID Panel     Status: Abnormal   Collection Time: 07/04/23 11:23 PM   Specimen: BLOOD RIGHT ARM  Result Value Ref Range Status   Specimen Description  BLOOD RIGHT ARM  Final   Special Requests   Final    BOTTLES DRAWN AEROBIC AND ANAEROBIC Blood Culture adequate volume   Culture  Setup Time   Final    GRAM NEGATIVE RODS IN BOTH AEROBIC AND ANAEROBIC BOTTLES CRITICAL RESULT CALLED TO, READ BACK BY AND VERIFIED WITH: PHARMD ANDREW MEYER ON 07/05/23 @ 1908 BY DRT    Culture (A)  Final    PASTEURELLA MULTOCIDA Usually susceptible to penicillin and other beta lactam agents,quinolones,macrolides and tetracyclines. Performed at [facility], [address] [place], [place] [address]    Report Status 07/07/2023 FINAL  Final  Culture, blood (Routine X 2) w Reflex to ID Panel     Status: Abnormal   Collection Time: 07/04/23 11:23 PM   Specimen: BLOOD LEFT ARM  Result Value Ref Range Status   Specimen Description BLOOD LEFT ARM  Final   Special Requests   Final    BOTTLES DRAWN AEROBIC AND ANAEROBIC Blood Culture adequate volume   Culture  Setup Time   Final    GRAM NEGATIVE RODS IN BOTH AEROBIC AND ANAEROBIC BOTTLES CRITICAL VALUE NOTED.  VALUE IS CONSISTENT WITH PREVIOUSLY REPORTED AND CALLED VALUE.    Culture (A)  Final    PASTEURELLA MULTOCIDA Usually susceptible to penicillin and other beta lactam agents,quinolones,macrolides and tetracyclines. Performed at [facility], [address], [place], [place] [address]    Report Status 07/07/2023 FINAL  Final  Blood Culture  ID Panel (Reflexed)     Status: None   Collection Time: 07/04/23 11:23 PM  Result Value Ref Range Status   Enterococcus faecalis NOT DETECTED NOT DETECTED Final   Enterococcus Faecium NOT DETECTED NOT DETECTED Final   Listeria monocytogenes NOT DETECTED NOT DETECTED Final   Staphylococcus species NOT DETECTED NOT DETECTED Final   Staphylococcus aureus (BCID) NOT DETECTED NOT DETECTED Final   Staphylococcus epidermidis NOT DETECTED NOT DETECTED Final   Staphylococcus lugdunensis NOT DETECTED NOT DETECTED Final   Streptococcus species NOT DETECTED NOT DETECTED Final   Streptococcus agalactiae NOT DETECTED NOT DETECTED Final   Streptococcus pneumoniae NOT DETECTED NOT DETECTED Final   Streptococcus pyogenes NOT DETECTED NOT DETECTED Final   A.calcoaceticus-baumannii NOT DETECTED NOT DETECTED Final   Bacteroides fragilis NOT DETECTED NOT DETECTED Final   Enterobacterales NOT DETECTED NOT DETECTED Final   Enterobacter cloacae complex NOT DETECTED NOT DETECTED Final   Escherichia coli NOT DETECTED NOT DETECTED Final   Klebsiella aerogenes NOT DETECTED NOT DETECTED Final   Klebsiella oxytoca NOT DETECTED NOT DETECTED Final   Klebsiella pneumoniae NOT DETECTED NOT DETECTED Final   Proteus species NOT DETECTED NOT DETECTED Final   Salmonella species NOT DETECTED NOT DETECTED Final   Serratia marcescens NOT DETECTED NOT DETECTED Final   Haemophilus influenzae NOT DETECTED NOT DETECTED Final   Neisseria meningitidis NOT DETECTED NOT DETECTED Final   Pseudomonas aeruginosa NOT DETECTED NOT DETECTED Final   Stenotrophomonas maltophilia NOT DETECTED NOT DETECTED Final   Candida albicans NOT DETECTED NOT DETECTED Final   Candida auris NOT DETECTED NOT DETECTED Final   Candida glabrata NOT DETECTED NOT DETECTED Final   Candida krusei NOT DETECTED NOT DETECTED Final   Candida parapsilosis NOT DETECTED NOT DETECTED Final   Candida tropicalis NOT DETECTED NOT DETECTED Final   Cryptococcus  neoformans/gattii NOT DETECTED NOT DETECTED Final    Comment: Performed at [facility], [address], [place], [place] [address]     Radiology Studies: No  results found.  Scheduled Meds:  amoxicillin-clavulanate  1 tablet Oral Q12H   enoxaparin (LOVENOX) injection  40 mg Subcutaneous Daily   insulin aspart  0-5 Units Subcutaneous QHS   insulin aspart  0-6 Units Subcutaneous TID WC   pantoprazole  40 mg Oral Daily   sodium chloride flush  3 mL Intravenous Q12H   Continuous Infusions:     LOS: 4 days   Joycelyn Das, MD [facility] P9/28/2024, 9:00 AM

## 2023-07-09 NOTE — Plan of Care (Signed)
  Problem: Education: Goal: Knowledge of risk factors and measures for prevention of condition will improve Outcome: Progressing   Problem: Coping: Goal: Psychosocial and spiritual needs will be supported Outcome: Progressing   Problem: Respiratory: Goal: Will maintain a patent airway Outcome: Progressing Goal: Complications related to the disease process, condition or treatment will be avoided or minimized Outcome: Progressing   Problem: Education: Goal: Understanding of CV disease, CV risk reduction, and recovery process will improve Outcome: Progressing Goal: Individualized Educational Video(s) Outcome: Progressing   Problem: Activity: Goal: Ability to return to baseline activity level will improve Outcome: Progressing   Problem: Cardiovascular: Goal: Ability to achieve and maintain adequate cardiovascular perfusion will improve Outcome: Progressing Goal: Vascular access site(s) Level 0-1 will be maintained Outcome: Progressing   Problem: Health Behavior/Discharge Planning: Goal: Ability to safely manage health-related needs after discharge will improve Outcome: Progressing   Problem: Education: Goal: Knowledge of cardiac device and self-care will improve Outcome: Progressing Goal: Ability to safely manage health related needs after discharge will improve Outcome: Progressing Goal: Individualized Educational Video(s) Outcome: Progressing   Problem: Cardiac: Goal: Ability to achieve and maintain adequate cardiopulmonary perfusion will improve Outcome: Progressing   Problem: Education: Goal: Ability to describe self-care measures that may prevent or decrease complications (Diabetes Survival Skills Education) will improve Outcome: Progressing Goal: Individualized Educational Video(s) Outcome: Progressing   Problem: Coping: Goal: Ability to adjust to condition or change in health will improve Outcome: Progressing   Problem: Fluid Volume: Goal: Ability to  maintain a balanced intake and output will improve Outcome: Progressing   Problem: Health Behavior/Discharge Planning: Goal: Ability to identify and utilize available resources and services will improve Outcome: Progressing Goal: Ability to manage health-related needs will improve Outcome: Progressing   Problem: Metabolic: Goal: Ability to maintain appropriate glucose levels will improve Outcome: Progressing   Problem: Nutritional: Goal: Maintenance of adequate nutrition will improve Outcome: Progressing Goal: Progress toward achieving an optimal weight will improve Outcome: Progressing   Problem: Skin Integrity: Goal: Risk for impaired skin integrity will decrease Outcome: Progressing   Problem: Tissue Perfusion: Goal: Adequacy of tissue perfusion will improve Outcome: Progressing   Problem: Education: Goal: Knowledge of General Education information will improve Description: Including pain rating scale, medication(s)/side effects and non-pharmacologic comfort measures Outcome: Progressing   Problem: Health Behavior/Discharge Planning: Goal: Ability to manage health-related needs will improve Outcome: Progressing   Problem: Clinical Measurements: Goal: Ability to maintain clinical measurements within normal limits will improve Outcome: Progressing Goal: Will remain free from infection Outcome: Progressing Goal: Diagnostic test results will improve Outcome: Progressing Goal: Respiratory complications will improve Outcome: Progressing Goal: Cardiovascular complication will be avoided Outcome: Progressing   Problem: Activity: Goal: Risk for activity intolerance will decrease Outcome: Progressing   Problem: Nutrition: Goal: Adequate nutrition will be maintained Outcome: Progressing   Problem: Coping: Goal: Level of anxiety will decrease Outcome: Progressing   Problem: Elimination: Goal: Will not experience complications related to bowel motility Outcome:  Progressing Goal: Will not experience complications related to urinary retention Outcome: Progressing   Problem: Pain Managment: Goal: General experience of comfort will improve Outcome: Progressing   Problem: Safety: Goal: Ability to remain free from injury will improve Outcome: Progressing   Problem: Skin Integrity: Goal: Risk for impaired skin integrity will decrease Outcome: Progressing

## 2023-07-10 ENCOUNTER — Inpatient Hospital Stay (HOSPITAL_COMMUNITY): Payer: Medicare Other

## 2023-07-10 ENCOUNTER — Other Ambulatory Visit (HOSPITAL_COMMUNITY): Payer: Medicare Other

## 2023-07-10 DIAGNOSIS — R079 Chest pain, unspecified: Secondary | ICD-10-CM | POA: Diagnosis not present

## 2023-07-10 DIAGNOSIS — I4891 Unspecified atrial fibrillation: Secondary | ICD-10-CM | POA: Diagnosis not present

## 2023-07-10 DIAGNOSIS — N179 Acute kidney failure, unspecified: Secondary | ICD-10-CM | POA: Diagnosis not present

## 2023-07-10 DIAGNOSIS — G9340 Encephalopathy, unspecified: Secondary | ICD-10-CM | POA: Diagnosis not present

## 2023-07-10 DIAGNOSIS — I2510 Atherosclerotic heart disease of native coronary artery without angina pectoris: Secondary | ICD-10-CM | POA: Diagnosis not present

## 2023-07-10 LAB — ECHOCARDIOGRAM COMPLETE
AR max vel: 2.22 cm2
AV Area VTI: 2.05 cm2
AV Area mean vel: 2.29 cm2
AV Mean grad: 6 mm[Hg]
AV Peak grad: 12 mm[Hg]
Ao pk vel: 1.73 m/s
Area-P 1/2: 4.15 cm2
Height: 69 in
S' Lateral: 3.2 cm
Weight: 2733.7 [oz_av]

## 2023-07-10 LAB — GLUCOSE, CAPILLARY
Glucose-Capillary: 94 mg/dL (ref 70–99)
Glucose-Capillary: 101 mg/dL — ABNORMAL HIGH (ref 70–99)
Glucose-Capillary: 100 mg/dL — ABNORMAL HIGH (ref 70–99)
Glucose-Capillary: 118 mg/dL — ABNORMAL HIGH (ref 70–99)

## 2023-07-10 MED ORDER — DAPAGLIFLOZIN PROPANEDIOL 10 MG PO TABS
10.0000 mg | ORAL_TABLET | Freq: Every day | ORAL | Status: DC
  Administered 2023-07-11: 10 mg via ORAL
  Filled 2023-07-10: qty 1

## 2023-07-10 MED ORDER — AMLODIPINE BESYLATE 10 MG PO TABS
10.0000 mg | ORAL_TABLET | Freq: Every day | ORAL | Status: DC
  Administered 2023-07-10 – 2023-07-11 (×2): 10 mg via ORAL
  Filled 2023-07-10: qty 2
  Filled 2023-07-10: qty 1

## 2023-07-10 MED ORDER — FUROSEMIDE 40 MG PO TABS
40.0000 mg | ORAL_TABLET | Freq: Every day | ORAL | Status: DC
  Administered 2023-07-11: 40 mg via ORAL
  Filled 2023-07-10: qty 1

## 2023-07-10 MED ORDER — ASPIRIN 81 MG PO CHEW
81.0000 mg | CHEWABLE_TABLET | Freq: Every morning | ORAL | Status: DC
  Administered 2023-07-11: 81 mg via ORAL
  Filled 2023-07-10: qty 1

## 2023-07-10 MED ORDER — BISOPROLOL FUMARATE 10 MG PO TABS
10.0000 mg | ORAL_TABLET | Freq: Every day | ORAL | Status: DC
  Administered 2023-07-10 – 2023-07-11 (×2): 10 mg via ORAL
  Filled 2023-07-10 (×2): qty 1

## 2023-07-10 MED ORDER — TAMSULOSIN HCL 0.4 MG PO CAPS
0.4000 mg | ORAL_CAPSULE | Freq: Every day | ORAL | Status: DC
  Administered 2023-07-10: 0.4 mg via ORAL
  Filled 2023-07-10: qty 1

## 2023-07-10 MED ORDER — PERFLUTREN LIPID MICROSPHERE
1.0000 mL | INTRAVENOUS | Status: AC | PRN
  Administered 2023-07-10: 2 mL via INTRAVENOUS

## 2023-07-10 MED ORDER — OYSTER SHELL CALCIUM/D3 500-5 MG-MCG PO TABS
1.0000 | ORAL_TABLET | Freq: Every morning | ORAL | Status: DC
  Administered 2023-07-11: 1 via ORAL
  Filled 2023-07-10: qty 1

## 2023-07-10 MED ORDER — ATORVASTATIN CALCIUM 40 MG PO TABS
40.0000 mg | ORAL_TABLET | Freq: Every day | ORAL | Status: DC
  Administered 2023-07-10: 40 mg via ORAL
  Filled 2023-07-10: qty 1

## 2023-07-10 NOTE — Evaluation (Signed)
Occupational Therapy Evaluation Patient Details Name: Bradley KWIATKOWSKI Sr. MRN: [id number] DOB: December 02, 1940 Today's Date: 07/10/2023   History of Present Illness 82yo male who presented to [facility] 07/04/23 due to intermittent confusion, weakness, multiple falls. Imaging clear of acute injury. Admit with acute encephalopathy, AKI, RLE cellulitis. PMH: OA, Afib, CAD, CHF, HLD, HTN, chronic diastolic heart failure, chronic respiratory failure, hx intracranial bleed 2013, home O2 use, pacemaker, PVD, pulmonary HTN, DM, lung decortication, pleural scarification.   Clinical Impression   At baseline, pt is Independent to Mod I with ADLs, IADLs, and functional mobility and drives. Pt reports two falls in past 6 months with one leading to Left wrist fx and one leading to this admission. Pt now presents with mildly decreased activity tolerance, decreased balance during functional tasks, and decreased safety and independence with ADLs and functional transfers/mobility. Pt currently demonstrates ability to complete ADL Mod I to Supervision for safety and functional mobility/transfers with a RW with Supervision. Pt reports he feels he is near baseline PLOF. Pt will benefit from acute skilled OT services to address deficits outlined below and increase safety and independence with functional tasks. Post acute discharge, no OT follow up is indicated at this time.       If plan is discharge home, recommend the following: A little help with walking and/or transfers;A little help with bathing/dressing/bathroom;Assistance with cooking/housework;Assist for transportation;Help with stairs or ramp for entrance    Functional Status Assessment  Patient has had a recent decline in their functional status and demonstrates the ability to make significant improvements in function in a reasonable and predictable amount of time.  Equipment Recommendations  None recommended by OT (Pt already has needed equipment)    Recommendations for  Other Services       Precautions / Restrictions Precautions Precautions: Fall;Other (comment) Precaution Comments: watch vitals, has hx of L wrist fx in brace but reports MD told him OK for WB LUE in brace Restrictions Weight Bearing Restrictions: No      Mobility Bed Mobility Overal bed mobility: Modified Independent Bed Mobility: Sit to Supine, Supine to Sit     Supine to sit: Modified independent (Device/Increase time), HOB elevated, Used rails Sit to supine: Modified independent (Device/Increase time), HOB elevated, Used rails        Transfers Overall transfer level: Needs assistance Equipment used: Rolling walker (2 wheels) Transfers: Sit to/from Stand, Bed to chair/wheelchair/BSC Sit to Stand: Modified independent (Device/Increase time)     Step pivot transfers: Supervision     General transfer comment: Supervision recommended for increased safety      Balance Overall balance assessment: History of Falls, Needs assistance Sitting-balance support: No upper extremity supported, Feet supported Sitting balance-Leahy Scale: Good Sitting balance - Comments: pt sitting EOB to eat his lunch tray   Standing balance support: Single extremity supported, Bilateral upper extremity supported, During functional activity, Reliant on assistive device for balance Standing balance-Leahy Scale: Poor                             ADL either performed or assessed with clinical judgement   ADL Overall ADL's : Modified independent;Needs assistance/impaired                                       General ADL Comments: Pt largely Mod I with all ADLs with Supervision recommended for  increased safety with bathing and functional mobility/transfers secondary to decreased balance and activity tolerance. Pt reports he feels near baseline.     Vision Baseline Vision/History: 1 Wears glasses Ability to See in Adequate Light: 0 Adequate (with glasses) Patient  Visual Report: No change from baseline       Perception         Praxis         Pertinent Vitals/Pain Pain Assessment Pain Assessment: No/denies pain Pain Intervention(s): Monitored during session     Extremity/Trunk Assessment Upper Extremity Assessment Upper Extremity Assessment: Right hand dominant;Overall [facility] for tasks assessed;LUE deficits/detail (Overall strength, ROM, coordination WFL. No MMT performed to Left wrist but with pt using L UE functionally.) LUE Deficits / Details: Pt sustained Left wrist fx approx 3-4 weeks ago and is WBAT per pt. Pt has follow up ortho appointment scheduled for 10/8. Pt with L wrist brace.   Lower Extremity Assessment Lower Extremity Assessment: Defer to PT evaluation   Cervical / Trunk Assessment Cervical / Trunk Assessment: Kyphotic   Communication Communication Communication: No apparent difficulties   Cognition Arousal: Alert Behavior During Therapy: WFL for tasks assessed/performed Overall Cognitive Status: Within Functional Limits for tasks assessed                                 General Comments: Cognition largely [facility] wiht pt AAOx4 and pleasant throughout sesison. Pt with mild short term memory deficits and occasional impulsivity with movement noted during session.     General Comments  VSS on 4L continuous O2 through nasal cannula throughout session.    Exercises     Shoulder Instructions      Home Living Family/patient expects to be discharged to:: Private residence Living Arrangements: Other relatives (adult granddaughter, her boyfriend, and her baby; pt reports his wife was moved to a hospice facility on 9/28) Available Help at Discharge: Family;Available 24 hours/day Type of Home: House (townhouse) Home Access: Stairs to enter Secretary/administrator of Steps: 1+1 Entrance Stairs-Rails: None;Right Home Layout: Multi-level Alternate Level Stairs-Number of Steps: 3 story townhome with stair lift to 2nd  floor, does not go to 3rd floor Alternate Level Stairs-Rails: Right Bathroom Shower/Tub: Producer, television/film/video: Handicapped height Bathroom Accessibility: No   Home Equipment: Cane - single point;Shower seat - built in;Grab bars - Chartered loss adjuster (2 wheels);Hand held shower head;Grab bars - toilet;Other (comment) (chair lift from first to second floor)   Additional Comments: 4LPM O2 at baseline 24/7      Prior Functioning/Environment Prior Level of Function : Independent/Modified Independent;History of Falls (last six months)             Mobility Comments: Independent to Mod I; reports 2 falls in past 6 months leading to L wrist fx and this admission; pt drives ADLs Comments: Independent to Mod I with ADLs and IADLs        OT Problem List: Decreased activity tolerance;Impaired balance (sitting and/or standing);Decreased safety awareness      OT Treatment/Interventions: Self-care/ADL training;DME and/or AE instruction;Patient/family education;Balance training;Therapeutic activities    OT Goals(Current goals can be found in the care plan section) Acute Rehab OT Goals Patient Stated Goal: To return home OT Goal Formulation: With patient Time For Goal Achievement: 07/24/23 Potential to Achieve Goals: Good ADL Goals Pt Will Perform Grooming: with modified independence;standing Pt Will Perform Lower Body Bathing: with modified independence;sit to/from stand;sitting/lateral leans Pt Will Perform Lower  Body Dressing: with modified independence;sit to/from stand;sitting/lateral leans Pt Will Transfer to Toilet: with modified independence;regular height toilet;grab bars;ambulating (with least restrictive AD) Pt Will Perform Toileting - Clothing Manipulation and hygiene: with modified independence;sitting/lateral leans;sit to/from stand Additional ADL Goal #1: Pt will demonstrate understanding of fall prevention strategies through teach back with handout provided.   OT Frequency: Min 1X/week    Co-evaluation              AM-PAC OT "6 Clicks" Daily Activity     Outcome Measure Help from another person eating meals?: None Help from another person taking care of personal grooming?: None (in sitting) Help from another person toileting, which includes using toliet, bedpan, or urinal?: A Little Help from another person bathing (including washing, rinsing, drying)?: A Little Help from another person to put on and taking off regular upper body clothing?: None Help from another person to put on and taking off regular lower body clothing?: None 6 Click Score: 22   End of Session Equipment Utilized During Treatment: Gait belt;Rolling walker (2 wheels);Oxygen;Other (comment) (L wrist brace) Nurse Communication: Mobility status (VSS on 4L)  Activity Tolerance: Patient tolerated treatment well;No increased pain Patient left: in bed;with call bell/phone within reach  OT Visit Diagnosis: Unsteadiness on feet (R26.81);Repeated falls (R29.6);History of falling (Z91.81)                Time: 6578-4696 OT Time Calculation (min): 25 min Charges:  OT General Charges $OT Visit: 1 Visit OT Treatments $Self Care/Home Management : 8-22 mins  Ziere Docken "Orson Eva., OTR/L, MA Acute Rehab [phone]   Lendon Colonel 07/10/2023, 1:57 PM

## 2023-07-10 NOTE — Plan of Care (Signed)
  Problem: Health Behavior/Discharge Planning: Goal: Ability to safely manage health-related needs after discharge will improve Outcome: Completed/Met

## 2023-07-10 NOTE — Progress Notes (Signed)
PROGRESS NOTE  Bradley HUNSICKER Sr.  ZDG:[id number] DOB: Sep 09, 1941 DOA: 07/04/2023 PCP: [name], MD   Brief Narrative:  Patient is a 82 year old male with history of COPD, chronic hypoxic/hypercarbic respiratory failure, diastolic CHF, hypertension, type 2 diabetes, coronary artery disease ,atrial fibrillation status post ablation presented to the hospital from home with complaint of intermittent confusion, generalized weakness, multiple falls.  Report of fall twice in the last week, progressive forgetfulness, confusion, weakness.  On presentation ,he was confused.  CT head, cervical spine CT did not show any acute findings.  Lab work showed a creatinine of 1.2, WBC count of 20K.  Patient ws started on antibiotics for right lower extremity cellulitis.  Blood cultures showed  Pasturella Multocida.  ID was consulted Patient has clinically improved.   Assessment & Plan:  Principal Problem:   Acute encephalopathy Active Problems:   Type 2 diabetes mellitus (HCC)   Essential hypertension   Atrial fibrillation (HCC)   Chronic respiratory failure with hypoxia and hypercapnia (HCC)   Chronic diastolic heart failure (HCC)   COPD,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   CAD (coronary artery disease)   AKI (acute kidney injury) (HCC)   Cellulitis of right leg   Gram-negative bacteremia/Right lower extremity cellulitis:  Patient was started on IV Rocephin.  Leukocytosis including cellulitis has improved.  Sepsis physiology has resolved.  Blood cultures showed Pasturella Multocida.  ID was consulted, recommended to continue ceftriaxone until discharge.  Will continue Augmentin through 07/14/23 on discharge.  Acute metabolic encephalopathy:  Resolved at this time.  Presented with somnolence, generalized weakness, confusion.  CT head did not show any acute findings.  Encephalopathy could be secondary to AKI or sepsis from cellulitis.  Ammonia level normal.  TSH level normal.  ABG did not show any hypercarbia.   AKI: Improved  after IV fluids.  Latest creatinine of 0.7.  Lasix and Farxiga on hold. Will restart.  Chest pain /Coronary artery  disease:  On aspirin, Lipitor at home.  Complained of left-sided chest pressure on the morning of 9/25 .  No further chest pain.  Troponins mild elevated but flat trend.   Started on Protonix for possible GERD.  Has reproducible chest pain.  Troponin was negative.  2D echocardiogram pending.  COPD: Currently not in exacerbation.  On supplemental oxygen at 4 L at home.  Currently on 3 L of oxygen.  Patient is a past smoker  Hypertension: Takes amlodipine, bisoprolol, Lasix at home.  Started at this time.  Type 2 diabetes mellitus: Recent hemoglobin A1c of 6.3.  Currently on sliding scale  Hypokalemia: Resolved with replacement.  No recent labs.  Debility/deconditioning: PT/OT consulted,recommended HH.  He ambulates with the help of walker/cane, lives with his grandson and granddaughter  DVT prophylaxis:enoxaparin (LOVENOX) injection 40 mg Start: 07/05/23 1000     Code Status: Full Code  Family Communication: None today.  Previous provider had spoken with family 07/08/2023  Patient status:Inpatient  Patient is from :Home  Anticipated discharge VZ:DGLO   Estimated DC date:  Likely tomorrow.  Pending 2D echocardiogram.  Consultants:  ID  Procedures: none  Antimicrobials:  Anti-infectives (From admission, onward)    Start     Dose/Rate Route Frequency Ordered Stop   07/08/23 1000  amoxicillin-clavulanate (AUGMENTIN) 875-125 MG per tablet 1 tablet        1 tablet Oral Every 12 hours 07/07/23 1517 07/11/23 2159   07/05/23 2200  cefTRIAXone (ROCEPHIN) 2 g in sodium chloride 0.9 % 100 mL IVPB  2 g 200 mL/hr over 30 Minutes Intravenous Daily at bedtime 07/05/23 0807 07/07/23 2153   07/04/23 2315  cefTRIAXone (ROCEPHIN) 1 g in sodium chloride 0.9 % 100 mL IVPB  Status:  Discontinued        1 g 200 mL/hr over 30 Minutes Intravenous Daily at bedtime 07/04/23 2301  07/05/23 0807   07/04/23 2230  doxycycline (VIBRA-TABS) tablet 100 mg        100 mg Oral  Once 07/04/23 2226 07/04/23 2240      Subjective: Today, patient was seen and examined at bedside.  Feels okay.  Patient denies any fever, chills, runny nose, shortness of breath or overt chest pain today.  Objective: Vitals:   07/09/23 2214 07/10/23 0500 07/10/23 0614 07/10/23 0922  BP:   (!) 153/84 (!) 148/86  Pulse:   71 74  Resp:   18   Temp:   98.1 F (36.7 C) 98.2 F (36.8 C)  TempSrc:   Oral Oral  SpO2: 97%  98% 98%  Weight:  77.5 kg    Height:        Intake/Output Summary (Last 24 hours) at 07/10/2023 1506 Last data filed at 07/10/2023 1300 Gross per 24 hour  Intake 1320 ml  Output 2050 ml  Net -730 ml   Filed Weights   07/07/23 0434 07/08/23 0500 07/10/23 0500  Weight: 77.3 kg 77.5 kg 77.5 kg    Physical examination:  General:  Average built, not in obvious distress, elderly male, Communicative, on nasal cannula oxygen HENT:   No scleral pallor or icterus noted. Oral mucosa is moist.  Chest:    Diminished breath sounds bilaterally. No crackles or wheezes.  CVS: S1 &S2 heard. No murmur.  Regular rate and rhythm. Abdomen: Soft, nontender, nondistended.  Bowel sounds are heard.   Extremities: No cyanosis, clubbing or edema.  Peripheral pulses are palpable. Psych: Alert, awake and oriented, normal mood CNS:  No cranial nerve deficits.  Power equal in all extremities.   Skin: Warm and dry.  Bilateral lower extremity chronic venous stasis changes, shallow ulcer on the ventral surface of the right leg.   Data Reviewed: I have personally reviewed following labs and imaging studies  CBC: Recent Labs  Lab 07/04/23 1630 07/04/23 2323 07/06/23 0504  WBC 20.3* 18.6* 9.8  NEUTROABS 17.6*  --   --   HGB 13.5 13.5 13.7  HCT 40.6 40.5 41.7  MCV 96.2 96.9 93.9  PLT 154 151 145*   Basic Metabolic Panel: Recent Labs  Lab 07/04/23 1630 07/04/23 2323 07/06/23 0504  07/07/23 0538 07/08/23 2331  NA 138 138 138 138  --   K 4.0 3.4* 3.4* 4.8 4.3  CL 101 101 104 107  --   CO2 25 28 25 26   --   GLUCOSE 115* 103* 101* 110*  --   BUN 22 23 15  7*  --   CREATININE 1.26* 1.11 0.75 0.76  --   CALCIUM 8.4* 8.3* 8.3* 8.2*  --   MG  --  2.1  --   --  1.9  PHOS  --  3.1  --   --   --      Recent Results (from the past 240 hour(s))  Culture, blood (Routine X 2) w Reflex to ID Panel     Status: Abnormal   Collection Time: 07/04/23 11:23 PM   Specimen: BLOOD RIGHT ARM  Result Value Ref Range Status   Specimen Description BLOOD RIGHT ARM  Final  Special Requests   Final    BOTTLES DRAWN AEROBIC AND ANAEROBIC Blood Culture adequate volume   Culture  Setup Time   Final    GRAM NEGATIVE RODS IN BOTH AEROBIC AND ANAEROBIC BOTTLES CRITICAL RESULT CALLED TO, READ BACK BY AND VERIFIED WITH: PHARMD ANDREW MEYER ON 07/05/23 @ 1908 BY DRT    Culture (A)  Final    PASTEURELLA MULTOCIDA Usually susceptible to penicillin and other beta lactam agents,quinolones,macrolides and tetracyclines. Performed at [facility], [address] [place], [place] [address]    Report Status 07/07/2023 FINAL  Final  Culture, blood (Routine X 2) w Reflex to ID Panel     Status: Abnormal   Collection Time: 07/04/23 11:23 PM   Specimen: BLOOD LEFT ARM  Result Value Ref Range Status   Specimen Description BLOOD LEFT ARM  Final   Special Requests   Final    BOTTLES DRAWN AEROBIC AND ANAEROBIC Blood Culture adequate volume   Culture  Setup Time   Final    GRAM NEGATIVE RODS IN BOTH AEROBIC AND ANAEROBIC BOTTLES CRITICAL VALUE NOTED.  VALUE IS CONSISTENT WITH PREVIOUSLY REPORTED AND CALLED VALUE.    Culture (A)  Final    PASTEURELLA MULTOCIDA Usually susceptible to penicillin and other beta lactam agents,quinolones,macrolides and tetracyclines. Performed at [facility], [address] [place], [place] [address]    Report Status 07/07/2023 FINAL  Final  Blood Culture  ID Panel (Reflexed)     Status: None   Collection Time: 07/04/23 11:23 PM  Result Value Ref Range Status   Enterococcus faecalis NOT DETECTED NOT DETECTED Final   Enterococcus Faecium NOT DETECTED NOT DETECTED Final   Listeria monocytogenes NOT DETECTED NOT DETECTED Final   Staphylococcus species NOT DETECTED NOT DETECTED Final   Staphylococcus aureus (BCID) NOT DETECTED NOT DETECTED Final   Staphylococcus epidermidis NOT DETECTED NOT DETECTED Final   Staphylococcus lugdunensis NOT DETECTED NOT DETECTED Final   Streptococcus species NOT DETECTED NOT DETECTED Final   Streptococcus agalactiae NOT DETECTED NOT DETECTED Final   Streptococcus pneumoniae NOT DETECTED NOT DETECTED Final   Streptococcus pyogenes NOT DETECTED NOT DETECTED Final   A.calcoaceticus-baumannii NOT DETECTED NOT DETECTED Final   Bacteroides fragilis NOT DETECTED NOT DETECTED Final   Enterobacterales NOT DETECTED NOT DETECTED Final   Enterobacter cloacae complex NOT DETECTED NOT DETECTED Final   Escherichia coli NOT DETECTED NOT DETECTED Final   Klebsiella aerogenes NOT DETECTED NOT DETECTED Final   Klebsiella oxytoca NOT DETECTED NOT DETECTED Final   Klebsiella pneumoniae NOT DETECTED NOT DETECTED Final   Proteus species NOT DETECTED NOT DETECTED Final   Salmonella species NOT DETECTED NOT DETECTED Final   Serratia marcescens NOT DETECTED NOT DETECTED Final   Haemophilus influenzae NOT DETECTED NOT DETECTED Final   Neisseria meningitidis NOT DETECTED NOT DETECTED Final   Pseudomonas aeruginosa NOT DETECTED NOT DETECTED Final   Stenotrophomonas maltophilia NOT DETECTED NOT DETECTED Final   Candida albicans NOT DETECTED NOT DETECTED Final   Candida auris NOT DETECTED NOT DETECTED Final   Candida glabrata NOT DETECTED NOT DETECTED Final   Candida krusei NOT DETECTED NOT DETECTED Final   Candida parapsilosis NOT DETECTED NOT DETECTED Final   Candida tropicalis NOT DETECTED NOT DETECTED Final   Cryptococcus  neoformans/gattii NOT DETECTED NOT DETECTED Final    Comment: Performed at [facility], [address] [place], [place] [address]     Radiology Studies: No results found.  Scheduled Meds:  amoxicillin-clavulanate  1 tablet Oral Q12H   enoxaparin (LOVENOX) injection  40 mg Subcutaneous Daily   insulin aspart  0-5 Units Subcutaneous QHS   insulin aspart  0-6 Units Subcutaneous TID WC   pantoprazole  40 mg Oral Daily   sodium chloride flush  3 mL Intravenous Q12H   Continuous Infusions:     LOS: 5 days   Joycelyn Das, MD [facility] P9/29/2024, 3:06 PM

## 2023-07-10 NOTE — Progress Notes (Signed)
*  PRELIMINARY RESULTS* Echocardiogram 2D Echocardiogram has been performed with Definity.  Stacey Drain 07/10/2023, 6:48 PM

## 2023-07-11 DIAGNOSIS — I4891 Unspecified atrial fibrillation: Secondary | ICD-10-CM | POA: Diagnosis not present

## 2023-07-11 DIAGNOSIS — N179 Acute kidney failure, unspecified: Secondary | ICD-10-CM | POA: Diagnosis not present

## 2023-07-11 DIAGNOSIS — L03115 Cellulitis of right lower limb: Secondary | ICD-10-CM | POA: Diagnosis not present

## 2023-07-11 DIAGNOSIS — G9340 Encephalopathy, unspecified: Secondary | ICD-10-CM | POA: Diagnosis not present

## 2023-07-11 LAB — CREATININE, SERUM
Creatinine, Ser: 0.49 mg/dL — ABNORMAL LOW (ref 0.61–1.24)
GFR, Estimated: >60 mL/min (ref >60)

## 2023-07-11 LAB — GLUCOSE, CAPILLARY: Glucose-Capillary: 140 mg/dL — ABNORMAL HIGH (ref 70–99)

## 2023-07-11 MED ORDER — AMOXICILLIN-POT CLAVULANATE 875-125 MG PO TABS: 1.0000 | ORAL_TABLET | Freq: Two times a day (BID) | ORAL | 0 refills | Status: AC

## 2023-07-11 NOTE — TOC Transition Note (Signed)
Transition of Care [facility]) - CM/SW Discharge Note   Patient Details  Name: Bradley QUAM Sr. MRN: [id number] Date of Birth: Oct 21, 1940  [facility] of [facility]) CM/SW Contact:  Tom-Johnson, Hershal Coria, RN Phone Number: 07/11/2023, 10:51 AM   Clinical Narrative:     Patient is scheduled for discharge today.  Readmission Risk Assessment done. Home health info, outpatient f/u, hospital f/u and discharge instructions on AVS. Grand son, Remo to transport at discharge.  No further TOC needs noted.           Final next level of care: Home w Home Health Services Barriers to Discharge: Barriers Resolved   Patient Goals and CMS Choice CMS Medicare.gov Compare Post Acute Care list provided to:: Patient Choice offered to / list presented to : Patient  Discharge Placement                  Patient to be transferred to facility by: Grand son Name of family member notified: Tejon    Discharge Plan and Services Additional resources added to the After Visit Summary for                  DME Arranged: N/A DME Agency: NA       HH Arranged: PT HH Agency: [facility] Date HH Agency Contacted: 07/07/23 Time HH Agency Contacted: 1430 Representative spoke with at [facility] Agency: Tresa Endo  Social Determinants of Health (SDOH) Interventions SDOH Screenings   Food Insecurity: No Food Insecurity (07/05/2023)  Housing: Low Risk  (07/05/2023)  Transportation Needs: No Transportation Needs (07/05/2023)  Utilities: Patient Unable To Answer (07/05/2023)  Alcohol Screen: Low Risk  (04/08/2023)  Depression (PHQ2-9): Low Risk  (04/08/2023)  Financial Resource Strain: Low Risk  (04/08/2023)  Physical Activity: Insufficiently Active (04/08/2023)  Social Connections: Moderately Integrated (04/08/2023)  Stress: No Stress Concern Present (04/08/2023)  Tobacco Use: Medium Risk (07/04/2023)     Readmission Risk Interventions    07/07/2023    2:33 PM 08/29/2022    3:33 PM 09/02/2021    10:31 AM  Readmission Risk Prevention Plan  Transportation Screening Complete Complete Complete  PCP or Specialist Appt within 3-5 Days Complete Complete Complete  HRI or Home Care Consult Complete Complete Complete  Social Work Consult for Recovery Care Planning/Counseling Complete Complete Complete  Palliative Care Screening Not Applicable Not Applicable Not Applicable  Medication Review (RN Care Manager) Referral to Pharmacy Complete Complete

## 2023-07-11 NOTE — Care Management Important Message (Signed)
Important Message  Patient Details  Name: Bradley CUPPETT Sr. MRN: [id number] Date of Birth: 07/26/1941   Important Message Given:  Yes - Medicare IM  Patient left prior to IM delivery will mail a copy to the patiant home address.    Koree Schopf 07/11/2023, 3:51 PM

## 2023-07-11 NOTE — Progress Notes (Signed)
Bradley Searles Sr. to be discharged Home per MD order. Discussed prescriptions and follow up appointments with the patient. Prescriptions  and medication list explained in detail. Patient verbalized understanding.  Skin clean, dry and intact without evidence of skin break down, no evidence of skin tears noted. IV catheter discontinued intact. Site without signs and symptoms of complications. Dressing and pressure applied. Pt denies pain at the site currently. No complaints noted.  Patient free of lines, drains, and wounds.   An After Visit Summary (AVS) was printed and given to the patient. Patient escorted via wheelchair, and discharged home via private auto.  Arvilla Meres, RN

## 2023-07-11 NOTE — Plan of Care (Signed)
  Problem: Education: Goal: Knowledge of risk factors and measures for prevention of condition will improve Outcome: Adequate for Discharge   Problem: Coping: Goal: Psychosocial and spiritual needs will be supported Outcome: Adequate for Discharge   Problem: Respiratory: Goal: Will maintain a patent airway Outcome: Adequate for Discharge Goal: Complications related to the disease process, condition or treatment will be avoided or minimized Outcome: Adequate for Discharge   Problem: Education: Goal: Understanding of CV disease, CV risk reduction, and recovery process will improve Outcome: Adequate for Discharge Goal: Individualized Educational Video(s) Outcome: Adequate for Discharge   Problem: Activity: Goal: Ability to return to baseline activity level will improve Outcome: Adequate for Discharge   Problem: Cardiovascular: Goal: Ability to achieve and maintain adequate cardiovascular perfusion will improve Outcome: Adequate for Discharge Goal: Vascular access site(s) Level 0-1 will be maintained Outcome: Adequate for Discharge   Problem: Education: Goal: Knowledge of cardiac device and self-care will improve Outcome: Adequate for Discharge Goal: Ability to safely manage health related needs after discharge will improve Outcome: Adequate for Discharge Goal: Individualized Educational Video(s) Outcome: Adequate for Discharge   Problem: Cardiac: Goal: Ability to achieve and maintain adequate cardiopulmonary perfusion will improve Outcome: Adequate for Discharge   Problem: Education: Goal: Ability to describe self-care measures that may prevent or decrease complications (Diabetes Survival Skills Education) will improve Outcome: Adequate for Discharge   Problem: Health Behavior/Discharge Planning: Goal: Ability to identify and utilize available resources and services will improve Outcome: Adequate for Discharge Goal: Ability to manage health-related needs will  improve Outcome: Adequate for Discharge   Problem: Metabolic: Goal: Ability to maintain appropriate glucose levels will improve Outcome: Adequate for Discharge   Problem: Nutritional: Goal: Maintenance of adequate nutrition will improve Outcome: Adequate for Discharge Goal: Progress toward achieving an optimal weight will improve Outcome: Adequate for Discharge

## 2023-07-11 NOTE — Discharge Summary (Signed)
Physician Discharge Summary  Bradley STONEBREAKER Sr. EAV:[id number] DOB: November 09, 1940 DOA: 07/04/2023  PCP: [name], MD  Admit date: 07/04/2023 Discharge date: 07/11/2023  Admitted From: Home  Discharge disposition: Home   Recommendations for Outpatient Follow-Up:   Follow up with your primary care provider in one week.  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  Diet recommendation: .  Regular.  Wound care: None.  Code status: Full.   History of Present Illness:   Patient is a 82 year old male with history of COPD, chronic hypoxic/hypercarbic respiratory failure, diastolic CHF, hypertension, type 2 diabetes, coronary artery disease ,atrial fibrillation status post ablation presented to the hospital from home with complaint of intermittent confusion, generalized weakness, multiple falls. Report of fall twice in the last week, progressive forgetfulness, confusion, weakness. On presentation ,he was confused. CT head, cervical spine CT did not show any acute findings. Lab work showed a creatinine of 1.2, WBC count of 20K. Patient ws started on antibiotics for right lower extremity cellulitis.  Hospital Course:   Following conditions were addressed during hospitalization as listed below,  Pasturella bacteremia/Right lower extremity cellulitis:  Patient was started on IV Rocephin.  Leukocytosis and sepsis physiology has resolved.  Blood cultures showed Pasturella Multocida.  ID was consulted, received IV ceftriaxone until discharge.  Will continue Augmentin  through 07/14/23 on discharge to complete the course as per ID recommendation..   Acute metabolic encephalopathy:  Resolved at this time.  Presented with somnolence, generalized weakness, confusion.  CT head did not show any acute findings.  Encephalopathy could be secondary to AKI or sepsis from cellulitis.  Ammonia level normal.  TSH level normal.  ABG did not show any hypercarbia.    AKI: Improved after IV fluids.  Latest creatinine of 0.4.     Chest pain /Coronary artery  disease:  On aspirin, Lipitor at home.  Complained of left-sided chest pressure on the morning of 9/25 .  Did not have further chest pain.  Troponins mild elevated but flat trend.   Had reproducible chest pain.  Troponin was negative.  2D echocardiogram showed normal LV function with no regional wall motion abnormality.   COPD:  Currently not in exacerbation.  On supplemental oxygen at 4 L at home.  At baseline.  Essential hypertension: Takes amlodipine, bisoprolol, Lasix at home.  Restarted.   Type 2 diabetes mellitus: Recent hemoglobin A1c of 6.3.    Hypokalemia: Resolved with replacement.  No recent labs.   Debility/deconditioning: PT/OT consulted,recommended HH.  He ambulates with the help of walker/cane, lives with his grandson and granddaughter   Disposition.  At this time, patient is stable for disposition with outpatient PCP follow-up.  Medical Consultants:   ID  Procedures:    None Subjective:   Today, patient was seen and examined at bedside.  Denies any fever, chills, nausea, vomiting, dyspnea.  Feels at baseline.  Discharge Exam:   Vitals:   07/11/23 0515 07/11/23 0805  BP: 138/75 123/68  Pulse: 70 73  Resp: 18 17  Temp: 97.8 F (36.6  C) (!) 97.4 F (36.3 C)  SpO2: 95% 90%   Vitals:   07/10/23 1638 07/10/23 2009 07/11/23 0515 07/11/23 0805  BP: (!) 140/85 139/79 138/75 123/68  Pulse: 69 74 70 73  Resp: 18 20 18 17   Temp: 97.8 F (36.6 C) 98.6 F (37 C) 97.8 F (36.6 C) (!) 97.4 F  (36.3 C)  TempSrc: Oral Oral Oral Oral  SpO2: 98% 100% 95% 90%  Weight:      Height:       Body mass index is 25.23 kg/m.  General:  Average built, not in obvious distress, elderly male, Communicative, on nasal cannula oxygen HENT:   No scleral pallor or icterus noted. Oral mucosa is moist.  Chest:    Diminished breath sounds bilaterally. No crackles or wheezes.  CVS: S1 &S2 heard. No murmur.  Regular rate and rhythm. Abdomen: Soft, nontender, nondistended.  Bowel sounds are heard.   Extremities: No cyanosis, clubbing or edema.  Peripheral pulses are palpable. Psych: Alert, awake and oriented, normal mood CNS:  No cranial nerve deficits.  Power equal in all extremities.   Skin: Warm and dry.  Bilateral lower extremity chronic venous stasis changes, shallow ulcer on the ventral surface of the right leg.    The results of significant diagnostics from this hospitalization (including imaging, microbiology, ancillary and laboratory) are listed below for reference.     Diagnostic Studies:   CT Cervical Spine Wo Contrast  Result Date: 07/04/2023 CLINICAL DATA:  Status post fall. EXAM: CT CERVICAL SPINE WITHOUT CONTRAST TECHNIQUE: Multidetector CT imaging of the cervical spine was performed without intravenous contrast. Multiplanar CT image reconstructions were also generated. RADIATION DOSE REDUCTION: This exam was performed according to the departmental dose-optimization program which includes automated exposure control, adjustment of the mA and/or kV according to patient size and/or use of iterative reconstruction technique. COMPARISON:  None Available. FINDINGS: Alignment: There is reversal of the normal cervical spine lordosis. Skull base and vertebrae: No acute fracture. No primary bone lesion or focal pathologic process. Soft tissues and spinal canal: No prevertebral fluid or swelling. No visible canal hematoma. Disc levels: Moderate severity endplate sclerosis, mild anterior osteophyte  formation and mild posterior bony spurring are seen at the levels of C5-C6 and C6-C7. Mild anterior osteophyte formation is also seen at the level of C4-C5. Marked severity intervertebral disc space narrowing is seen at C3-C4, C5-C6 and C6-C7 with moderate severity intervertebral disc space narrowing at C4-C5. Bilateral moderate to marked severity multilevel facet joint hypertrophy is noted. Upper chest: Negative. Other: None. IMPRESSION: 1. No acute cervical spine fracture. 2. Moderate to marked severity multilevel degenerative changes, as described above. Electronically Signed   By: Aram Candela M.D.   On: 07/04/2023 20:17   CT Head Wo Contrast  Result Date: 07/04/2023 CLINICAL DATA:  Status post trauma. EXAM: CT HEAD WITHOUT CONTRAST TECHNIQUE: Contiguous axial images were obtained from the base of the skull through the vertex without intravenous contrast. RADIATION DOSE REDUCTION: This exam was performed according to the departmental dose-optimization program which includes automated exposure control, adjustment of the mA and/or kV according to patient size and/or use of iterative reconstruction technique. COMPARISON:  June 28, 2023 FINDINGS: Brain: There is moderate severity cerebral atrophy with widening of the extra-axial spaces and ventricular dilatation. There are areas of decreased attenuation within the white matter tracts of the supratentorial brain, consistent with microvascular disease changes. 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 Vascular: No hyperdense vessel or unexpected calcification. Skull: Normal.  Negative for fracture or focal lesion. Sinuses/Orbits: There is marked severity left maxillary sinus mucosal thickening. Other: None. IMPRESSION: 1. Generalized cerebral atrophy with chronic white matter small vessel ischemic changes. 2. Chronic right parieto-occipital infarct. 3. No acute intracranial abnormality. 4. Marked severity left maxillary sinus disease.  Electronically Signed   By: Aram Candela M.D.   On: 07/04/2023 20:14   DG Pelvis Portable  Result Date: 07/04/2023 CLINICAL DATA:  Witnessed fall.  Weakness. EXAM: PORTABLE PELVIS 1-2 VIEWS COMPARISON:  None Available. FINDINGS: The cortical margins of the bony pelvis are intact. No fracture. Pubic symphysis and sacroiliac joints are congruent. Both femoral heads are well-seated in the respective acetabula. IMPRESSION: No pelvic fracture. Electronically Signed   By: Narda Rutherford M.D.   On: 07/04/2023 17:22   DG Chest Portable 1 View  Result Date: 07/04/2023 CLINICAL DATA:  Fall and weakness. 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 Stable cardiomegaly. Unchanged mediastinal contours, aortic atherosclerosis. Chronic scarring in the lung bases. There is some increase in interstitial opacity in the mid lower lung zones. No confluent airspace disease. No pneumothorax or large pleural effusion. On limited assessment, no acute osseous findings. Bilateral shoulder arthropathy. IMPRESSION: 1. No evidence of traumatic injury. 2. Cardiomegaly. Increase in interstitial opacity in the mid lower lung zones may represent pulmonary edema. Electronically Signed   By: Narda Rutherford M.D.   On: 07/04/2023 17:21     Labs:   Basic Metabolic Panel: Recent Labs  Lab 07/04/23 1630 07/04/23 2323 07/06/23 0504 07/07/23 0538 07/08/23 2331 07/11/23 0601  NA 138 138 138 138  --   --   K 4.0 3.4* 3.4* 4.8 4.3  --   CL 101 101 104 107  --   --   CO2 25 28 25 26   --   --   GLUCOSE 115* 103* 101* 110*  --   --   BUN 22 23 15  7*  --   --   CREATININE 1.26* 1.11 0.75 0.76  --  0.49*  CALCIUM 8.4* 8.3* 8.3* 8.2*  --   --   MG  --  2.1  --   --  1.9  --   PHOS  --  3.1  --   --   --   --    GFR Estimated Creatinine Clearance: 71.2 mL/min (A) (by C-G formula based on SCr of 0.49 mg/dL (L)). Liver Function Tests: Recent Labs  Lab  07/04/23 1630 07/04/23 2323  AST 28 32  ALT 19 18  ALKPHOS 49 47  BILITOT 1.6* 1.5*  PROT 6.1* 6.2*  ALBUMIN 3.4* 3.3*   No results for input(s): "LIPASE", "AMYLASE" in the last 168 hours. Recent Labs  Lab 07/04/23 2323  AMMONIA <10   Coagulation profile No results for input(s): "INR", "PROTIME" in the last 168 hours.  CBC: Recent Labs  Lab 07/04/23 1630 07/04/23 2323 07/06/23 0504  WBC 20.3* 18.6* 9.8  NEUTROABS 17.6*  --   --   HGB 13.5 13.5 13.7  HCT 40.6 40.5 41.7  MCV 96.2 96.9 93.9  PLT 154 151 145*   Cardiac Enzymes: No results for input(s): "CKTOTAL", "CKMB", "CKMBINDEX", "TROPONINI" in the last 168 hours. BNP: Invalid input(s): "POCBNP" CBG: Recent Labs  Lab 07/10/23 0734 07/10/23 1118 07/10/23 1637 07/10/23 2011 07/11/23 0736  GLUCAP 94 101* 100* 118* 140*   D-Dimer No results for input(s): "DDIMER" in the last 72 hours. Hgb A1c No results  for input(s): "HGBA1C" in the last 72 hours. Lipid Profile No results for input(s): "CHOL", "HDL", "LDLCALC", "TRIG", "CHOLHDL", "LDLDIRECT" in the last 72 hours. Thyroid function studies No results for input(s): "TSH", "T4TOTAL", "T3FREE", "THYROIDAB" in the last 72 hours.  Invalid input(s): "FREET3" Anemia work up No results for input(s): "VITAMINB12", "FOLATE", "FERRITIN", "TIBC", "IRON", "RETICCTPCT" in the last 72 hours. Microbiology Recent Results (from the past 240 hour(s))  Culture, blood (Routine X 2) w Reflex to ID Panel     Status: Abnormal   Collection Time: 07/04/23 11:23 PM   Specimen: BLOOD RIGHT ARM  Result Value Ref Range Status   Specimen Description BLOOD RIGHT ARM  Final   Special Requests   Final    BOTTLES DRAWN AEROBIC AND ANAEROBIC Blood Culture adequate volume   Culture  Setup Time   Final    GRAM NEGATIVE RODS IN BOTH AEROBIC AND ANAEROBIC BOTTLES CRITICAL RESULT CALLED TO, READ BACK BY AND VERIFIED WITH: PHARMD ANDREW MEYER ON 07/05/23 @ 1908 BY DRT    Culture (A)  Final     PASTEURELLA MULTOCIDA Usually susceptible to penicillin and other beta lactam agents,quinolones,macrolides and tetracyclines. Performed at [facility], [address] [place], [place] [address]    Report Status 07/07/2023 FINAL  Final  Culture, blood (Routine X 2) w Reflex to ID Panel     Status: Abnormal   Collection Time: 07/04/23 11:23 PM   Specimen: BLOOD LEFT ARM  Result Value Ref Range Status   Specimen Description BLOOD LEFT ARM  Final   Special Requests   Final    BOTTLES DRAWN AEROBIC AND ANAEROBIC Blood Culture adequate volume   Culture  Setup Time   Final    GRAM NEGATIVE RODS IN BOTH AEROBIC AND ANAEROBIC BOTTLES CRITICAL VALUE NOTED.  VALUE IS CONSISTENT WITH PREVIOUSLY REPORTED AND CALLED VALUE.    Culture (A)  Final    PASTEURELLA MULTOCIDA Usually susceptible to penicillin and other beta lactam agents,quinolones,macrolides and tetracyclines. Performed at [facility], [address], [place], [place] [address]    Report Status 07/07/2023 FINAL  Final  Blood Culture ID Panel (Reflexed)     Status: None   Collection Time: 07/04/23 11:23 PM  Result Value Ref Range Status   Enterococcus faecalis NOT DETECTED NOT DETECTED Final   Enterococcus Faecium NOT DETECTED NOT DETECTED Final   Listeria monocytogenes NOT DETECTED NOT DETECTED Final   Staphylococcus species NOT DETECTED NOT DETECTED Final   Staphylococcus aureus (BCID) NOT DETECTED NOT DETECTED Final   Staphylococcus epidermidis NOT DETECTED NOT DETECTED Final   Staphylococcus lugdunensis NOT DETECTED NOT DETECTED Final   Streptococcus species NOT DETECTED NOT DETECTED Final   Streptococcus agalactiae NOT DETECTED NOT DETECTED Final   Streptococcus pneumoniae NOT DETECTED NOT DETECTED Final   Streptococcus pyogenes NOT DETECTED NOT DETECTED Final   A.calcoaceticus-baumannii NOT DETECTED NOT DETECTED Final   Bacteroides fragilis NOT DETECTED NOT DETECTED Final   Enterobacterales NOT DETECTED NOT  DETECTED Final   Enterobacter cloacae complex NOT DETECTED NOT DETECTED Final   Escherichia coli NOT DETECTED NOT DETECTED Final   Klebsiella aerogenes NOT DETECTED NOT DETECTED Final   Klebsiella oxytoca NOT DETECTED NOT DETECTED Final   Klebsiella pneumoniae NOT DETECTED NOT DETECTED Final   Proteus species NOT DETECTED NOT DETECTED Final   Salmonella species NOT DETECTED NOT DETECTED Final   Serratia marcescens NOT DETECTED NOT DETECTED Final   Haemophilus influenzae NOT DETECTED NOT DETECTED Final   Neisseria  meningitidis NOT DETECTED NOT DETECTED Final   Pseudomonas aeruginosa NOT DETECTED NOT DETECTED Final   Stenotrophomonas maltophilia NOT DETECTED NOT DETECTED Final   Candida albicans NOT DETECTED NOT DETECTED Final   Candida auris NOT DETECTED NOT DETECTED Final   Candida glabrata NOT DETECTED NOT DETECTED Final   Candida krusei NOT DETECTED NOT DETECTED Final   Candida parapsilosis NOT DETECTED NOT DETECTED Final   Candida tropicalis NOT DETECTED NOT DETECTED Final   Cryptococcus neoformans/gattii NOT DETECTED NOT DETECTED Final    Comment: Performed at [facility], [address], [place], [place] [address]     Discharge Instructions:   Discharge Instructions     AMB Referral to Community Care Coordinaton (ACO Patients)   Complete by: As directed    Hospital Follow up referral/Care Coordination:  readmission prevention, admitted during wife's hospitalization [wife to Hospice facility]  Primary Care Provider:  [name], MD [place] SW for post hospital follow up  Insurance plan: Medicare ACO REACH  Please assign to [facility] RN Care Coordinator for post hospital care coordination for readmission prevention follow up calls and assess for further needs.  Questions please call:   Charlesetta Shanks, RN, BSN, CCM [place]  [facility], [facility] Liaison Direct Dial: [phone] or secure chat Website:  [email or web address]     Reason for Referral: Care Coordination (ACO patients)   Disease managment services needed: Nurse Case Manager   Diagnoses of:  Kidney Failure Other     Other Diagnosis: encephalopathy   Expected date of contact: Urgent - 7 Days   Call MD for:  severe uncontrolled pain   Complete by: As directed    Call MD for:  temperature >100.4   Complete by: As directed    Diet general   Complete by: As directed    Discharge instructions   Complete by: As directed    Follow-up with your primary care provider in 1 week.  Check blood work at that time.  Complete course of antibiotics.  Do not over exert.  Seek medical attention for worsening symptoms.   Increase activity slowly   Complete by: As directed    No wound care   Complete by: As directed       Allergies as of 07/11/2023       Reactions   Hydrocodone Other (See Comments)   "given to him in the hospital; went thru withdrawals once home; dr said not to take it again" (09/27/2012) dizziness   Ultram [tramadol] Other (See Comments)   "given to him in the hospital; went thru withdrawals once home; dr said not to take it again" (09/27/2012) dizziness   Cialis [tadalafil] Other (See Comments)   Headache Backache  Dizziness    Avelox [moxifloxacin Hydrochloride] Itching, Other (See Comments)   Headache Dizziness        Medication List     TAKE these medications    acetaminophen 500 MG tablet Commonly known as: TYLENOL Take 1,000 mg by mouth every 8 (eight) hours as needed for headache or moderate pain.   amLODipine 10 MG tablet Commonly known as: NORVASC Take 1 tablet (10 mg total) by mouth daily.   amoxicillin-clavulanate 875-125 MG tablet Commonly known as: AUGMENTIN Take 1 tablet by mouth every 12 (twelve) hours for 3 days.   aspirin 81 MG tablet Take 81 mg by mouth every morning.   atorvastatin 40 MG tablet Commonly known as: LIPITOR Take 1 tablet (40 mg total) by mouth  at  bedtime.   bisoprolol 10 MG tablet Commonly known as: ZEBETA TAKE 1 TABLET DAILY   calcium-vitamin D 500-200 MG-UNIT tablet Commonly known as: OSCAL WITH D Take 1 tablet by mouth every morning.   cholecalciferol 25 MCG (1000 UNIT) tablet Commonly known as: VITAMIN D3 Take 1,000 Units by mouth every evening.   dapagliflozin propanediol 10 MG Tabs tablet Commonly known as: Farxiga Take 1 tablet (10 mg total) by mouth daily before breakfast.   diphenhydramine-acetaminophen 25-500 MG Tabs tablet Commonly known as: TYLENOL PM Take 1 tablet by mouth at bedtime as needed (sleep).   furosemide 40 MG tablet Commonly known as: LASIX TAKE 2 TABLETS EVERY MORNING AND 1 TABLET LATE IN THE AFTERNOON What changed: See the new instructions.   multivitamins ther. w/minerals Tabs tablet Take 1 tablet by mouth daily.   nitroGLYCERIN 0.4 MG SL tablet Commonly known as: NITROSTAT Place 1 tablet (0.4 mg total) under the tongue every 5 (five) minutes x 3 doses as needed for chest pain.   onetouch ultrasoft lancets Check blood sugars no more than twice daily   OneTouch Verio test strip Generic drug: glucose blood USE TO CHECK BLOOD SUGAR NO MORE THAN TWICE A DAY   OXYGEN Inhale 4 L/min into the lungs continuous.   pantoprazole 40 MG tablet Commonly known as: PROTONIX Take 1 tablet (40 mg total) by mouth daily before breakfast.   tamsulosin 0.4 MG Caps capsule Commonly known as: FLOMAX Take 1 capsule (0.4 mg total) by mouth daily after supper.        Follow-up Information     [facility] Follow [facility].   Specialty: Home Health Services Why: Someone will call you to schedule first home visit. If you have not received a call after two days of discharging home, call their number listed. If no one comes to assess, call Case Manager at [phone]. Contact information: [address] [place] [address] [phone]         [name], MD Follow up in 1  week(s).   Specialty: Internal Medicine Contact information: [address] [place] [address] [phone]                  Time coordinating discharge: 39 minutes  Signed:  Horace Lukas  [facility] 07/11/2023, 2:16 PM

## 2023-07-12 ENCOUNTER — Telehealth: Payer: Self-pay | Admitting: Internal Medicine

## 2023-07-12 NOTE — Telephone Encounter (Signed)
Okay to give orders in your name?

## 2023-07-12 NOTE — Telephone Encounter (Signed)
Caller/Agency: [facility]  Callback Number: [phone] Requesting OT/PT/Skilled Nursing/Social Work/Speech Therapy: PT Frequency: 1x9

## 2023-07-14 ENCOUNTER — Telehealth: Payer: Self-pay | Admitting: *Deleted

## 2023-07-14 NOTE — Progress Notes (Signed)
Care Coordination   Note   07/14/2023 Name: MEKAI KORINEK Sr. MRN: [id number] DOB: Aug 01, 1941  [name] is a 82 y.o. year old male who sees Paz, Nolon Rod, MD for primary care. I reached out to [name] by phone today to offer care coordination services.  Mr. Rodabaugh was given information about Care Coordination services today including:   The Care Coordination services include support from the care team which includes your Nurse Coordinator, Clinical Social Worker, or Pharmacist.  The Care Coordination team is here to help remove barriers to the health concerns and goals most important to you. Care Coordination services are voluntary, and the patient may decline or stop services at any time by request to their care team member.   Care Coordination Consent Status: Patient agreed to services and verbal consent obtained.   Follow up plan:  Telephone appointment with care coordination team member scheduled for:  07/26/2023  Encounter Outcome:  Patient Scheduled from referral   Burman Nieves, [facility] Care Coordination Care Guide Direct Dial: [phone]

## 2023-07-14 NOTE — Telephone Encounter (Signed)
Spoke w/ Mathis Fare- verbal orders given.

## 2023-07-18 ENCOUNTER — Ambulatory Visit: Payer: Medicare Other | Admitting: Internal Medicine

## 2023-07-25 ENCOUNTER — Ambulatory Visit (INDEPENDENT_AMBULATORY_CARE_PROVIDER_SITE_OTHER): Payer: Medicare Other | Admitting: Internal Medicine

## 2023-07-25 ENCOUNTER — Encounter: Payer: Self-pay | Admitting: Internal Medicine

## 2023-07-25 VITALS — BP 116/62 | HR 61 | Temp 98.0°F | Resp 20 | Ht 69.0 in | Wt 177.5 lb

## 2023-07-25 DIAGNOSIS — L03115 Cellulitis of right lower limb: Secondary | ICD-10-CM | POA: Diagnosis not present

## 2023-07-25 DIAGNOSIS — E1159 Type 2 diabetes mellitus with other circulatory complications: Secondary | ICD-10-CM

## 2023-07-25 DIAGNOSIS — Z23 Encounter for immunization: Secondary | ICD-10-CM

## 2023-07-25 LAB — CBC WITH DIFFERENTIAL/PLATELET
Basophils Absolute: 0 10*3/uL (ref 0.0–0.1)
Basophils Relative: 0.5 % (ref 0.0–3.0)
Eosinophils Absolute: 0.1 10*3/uL (ref 0.0–0.7)
Eosinophils Relative: 1.1 % (ref 0.0–5.0)
HCT: 41.3 % (ref 39.0–52.0)
Hemoglobin: 13.4 g/dL (ref 13.0–17.0)
Lymphocytes Relative: 25.4 % (ref 12.0–46.0)
Lymphs Abs: 1.7 10*3/uL (ref 0.7–4.0)
MCHC: 32.5 g/dL (ref 30.0–36.0)
MCV: 94.6 fL (ref 78.0–100.0)
Monocytes Absolute: 0.6 10*3/uL (ref 0.1–1.0)
Monocytes Relative: 8.7 % (ref 3.0–12.0)
Neutro Abs: 4.2 10*3/uL (ref 1.4–7.7)
Neutrophils Relative %: 64.3 % (ref 43.0–77.0)
Platelets: 345 10*3/uL (ref 150.0–400.0)
RBC: 4.37 Mil/uL (ref 4.22–5.81)
RDW: 14.4 % (ref 11.5–15.5)
WBC: 6.5 10*3/uL (ref 4.0–10.5)

## 2023-07-25 LAB — BASIC METABOLIC PANEL
BUN: 11 mg/dL (ref 6–23)
CO2: 32 meq/L (ref 19–32)
Calcium: 8.9 mg/dL (ref 8.4–10.5)
Chloride: 100 meq/L (ref 96–112)
Creatinine, Ser: 0.91 mg/dL (ref 0.40–1.50)
GFR: 78.52 mL/min (ref >60.00)
Glucose, Bld: 139 mg/dL — ABNORMAL HIGH (ref 70–99)
Potassium: 4.5 meq/L (ref 3.5–5.1)
Sodium: 139 meq/L (ref 135–145)

## 2023-07-25 LAB — HEMOGLOBIN A1C: Hgb A1c MFr Bld: 6.2 % (ref 4.6–6.5)

## 2023-07-25 NOTE — Patient Instructions (Addendum)
  Check the  blood pressure regularly Blood pressure goal:  between 110/65 and  135/85. If it is consistently higher or lower, let me know     GO TO THE LAB : Get the blood work     Next visit with me in 3 months.  Sooner if needed. Please schedule it at the front desk      Per our records you are due for your diabetic eye exam. Please contact your eye doctor to schedule an appointment. Please have them send copies of your office visit notes to [place]. Our fax number is [phone]. If you need a referral to an eye doctor please let us know.

## 2023-07-25 NOTE — Progress Notes (Unsigned)
Subjective:    Patient ID: Bradley Sit., male    DOB: [birth year], 82 y.o.   MRN: [id number]  DOS:  07/25/2023 Type of visit - description: Here with his granddaughter  Patient wife [name] passed away last week.  Admitted to hospital 07/04/2023, discharged 07/11/2023.  Admitted due to a fall, generalized weakness, confusion.  Was diagnosed with the following conditions Pasteurella bacteremia right lower extremity cellulitis.  Sepsis physiology resolved, ID consulted, discharged on Augmentin Encephalopathy: Resolved AKI: Improved without any antibiotics. CAD: Had left-sided chest pain, troponins slightly elevated but flat.  Pain was not felt to be cardiac. Was discharged home Labs: Last creatinine trended down to 0.4.  Last WBC was down to 9.8.  Since he left the hospital he is back home, reports he is doing well.  No fever or chills. DOE at baseline. Lower extremity skin: At baseline. Minimal cough.   Review of Systems See above   Past Medical History:  Diagnosis Date   Abnormal CT scan, chest 2012   CT chest, several lymphadenopathies. Sees pulmonary   Anemia    intermittent   Arthritis    "?back" (09/19/2018)   Atrial fibrillation (HCC)    s/p AV node ablation & BiV PPM implantation 09/08/11 (op dictation pending)   BPH (benign prostatic hyperplasia)    Saw Dr [name] 2004, normal renal u/s   Bronchitis 08/26/2017   CAD (coronary artery disease)    CHF (congestive heart failure) (HCC)    Thought primarily to be non-systolic although EF down (EF 19-14% 12/2010, down to 35-40% 09/05/11), cath 2008 with no CAD, nuclear study 07/2011 showing Small area of reversibility in the distal ant/lat wall the left ventricle suspicious for ischemia/septal wall HK but felt to be low risk  (per D/C Summary 07/2011)   Chronic bronchitis (HCC)    "get it ~ q yr"   Chronic diastolic heart failure (HCC)         Chronic respiratory failure with hypoxia and hypercapnia (HCC)  09/14/2010   Followed in Pulmonary clinic/ [facility]/ [place]  Started on 02 2lpm at discharge 09/23/11   - PFT's 10/28/2011  FEV1  1.40 (51%)  with ratio 70 and no better p B2 and DLCO 53 corrects to 101    - PFT's 10/18/2014    FEV1 1.71 (59%) with ratio 68 and no sign change p B2 and DLCO 53 corrects to 85  -  HC03   07/28/20  = 33  -  HCO3   03/17/21     = 31     Heart murmur    HLD (hyperlipidemia)    HTN (hypertension)    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   Pleural effusion 2008   S/p decortication   Pneumonia 08/02/2011   Pulmonary HTN (HCC)    per cath 2008   Type II diabetes mellitus (HCC) 1999    Past Surgical History:  Procedure Laterality Date   BI-VENTRICULAR PACEMAKER INSERTION Left 09/08/2011   Procedure: BI-VENTRICULAR PACEMAKER INSERTION (CRT-P);  Surgeon: Marinus Maw, MD;  Location: [facility] CATH LAB;  Service: Cardiovascular;  Laterality: Left;   BIV PACEMAKER GENERATOR CHANGEOUT N/A 11/10/2022   Procedure: BIV PACEMAKER GENERATOR CHANGEOUT;  Surgeon: Marinus Maw, MD;  Location: [facility] INVASIVE CV LAB;  Service: Cardiovascular;  Laterality: N/A;   CARDIAC CATHETERIZATION     "couple times; never had balloon or stent" (09/19/2018)   CATARACT EXTRACTION W/ INTRAOCULAR LENS  IMPLANT, BILATERAL Bilateral 08/2018   COLONOSCOPY  03/10/11   normal   INSERT / REPLACE / REMOVE PACEMAKER  09/08/11   pacemaker placement   LUNG DECORTICATION     PLEURAL SCARIFICATION     pneumothorax with fibrothorax  ~ 2010   RIGHT/LEFT HEART CATH AND CORONARY ANGIOGRAPHY N/A 10/05/2022   Procedure: RIGHT/LEFT HEART CATH AND CORONARY ANGIOGRAPHY;  Surgeon: Corky Crafts, MD;  Location: [facility] INVASIVE CV LAB;  Service: Cardiovascular;  Laterality: N/A;   TONSILLECTOMY     "as a kid"     Current  Outpatient Medications  Medication Instructions   acetaminophen (TYLENOL) 1,000 mg, Oral, Every 8 hours PRN   amLODipine (NORVASC) 10 mg, Oral, Daily   aspirin 81 mg, Oral, Every morning   atorvastatin (LIPITOR) 40 mg, Oral, Daily at bedtime   bisoprolol (ZEBETA) 10 mg, Oral, Daily   calcium-vitamin D (OSCAL WITH D) 500-200 MG-UNIT tablet 1 tablet, Oral, Every morning   cholecalciferol (VITAMIN D3) 1,000 Units, Oral, Every evening   dapagliflozin propanediol (FARXIGA) 10 mg, Oral, Daily before breakfast   diphenhydramine-acetaminophen (TYLENOL PM) 25-500 MG TABS tablet 1 tablet, Oral, At bedtime PRN   furosemide (LASIX) 40 MG tablet TAKE 2 TABLETS EVERY MORNING AND 1 TABLET LATE IN THE AFTERNOON   glucose blood (ONETOUCH VERIO) test strip USE TO CHECK BLOOD SUGAR NO MORE THAN TWICE A DAY   Lancets (ONETOUCH ULTRASOFT) lancets Check blood sugars no more than twice daily   Multiple Vitamins-Minerals (MULTIVITAMINS THER. W/MINERALS) TABS tablet 1 tablet, Oral, Daily   nitroGLYCERIN (NITROSTAT) 0.4 mg, Sublingual, Every 5 min x3 PRN   OXYGEN 4 L/min, Inhalation, Continuous   pantoprazole (PROTONIX) 40 mg, Oral, Daily before breakfast   tamsulosin (FLOMAX) 0.4 mg, Oral, Daily after supper       Objective:   Physical Exam BP 116/62   Pulse 61   Temp 98 F (36.7 C) (Oral)   Resp 20   Ht 5\' 9"  (1.753 m)   Wt 177 lb 8 oz (80.5 kg)   SpO2 91% Comment: 4L/min  BMI 26.21 kg/m  General:   Well developed, NAD, BMI noted.  On portable oxygen HEENT:  Normocephalic . Face symmetric, atraumatic Lungs:  CTA B Normal respiratory effort, no intercostal retractions, no accessory muscle use. Heart: RRR,  no murmur.  Lower extremities: See picture, skin is basically at baseline now. Skin: Not pale. Not jaundice Neurologic:  alert & oriented X3.  Speech normal, gait appropriate for age and unassisted Psych--  Cognition and judgment appear intact.  Cooperative with normal attention span and  concentration.  Behavior appropriate. No anxious or depressed appearing.      Assessment     Assessment  DM HTN Hyperlipidemia GERD Anxiety- insomnia  BPH Cardiovascular:Dr Ladona Ridgel  --  Cath x 2 [id number]: no significant CAD -- CHF, non-ischemic  --Atrial fibrillation; h/o AV node ablation, has a Pacemaker (biventricular pacing) --h/o IC bleeding: Not anticoagulated --Peripheral vascular disease Pulmonary: Dr [name], last OV 10-18-2014, f/u prn --COPD, severe, chronic resp failure, night O2 prn, has portable O2  --Pleural effusion s/p decortication --Pulmonary hypertension  Stasis dermatitis  Osteoporosis: T-score -4.1.  Evenity none available from men.  First Prolia shot 03/14/2023.  PLAN: Cellulitis, falls,Encephalopathy, AKI: Admitted to hospital with these conditions, he is  back to baseline and doing well.  Will recheck a BMP and CBC. DM: Reports good med compliance, check A1c, continue Farxiga COPD: At baseline. Stasis dermatitis: Was Dx with cellulitis at the hospital, now back to baseline.  See picture Social: Here with his granddaughter, lost his wife [name] last week.  Condolences provided.  Fortunately, granddaughter Hospital doctor, her daughter and fianc live with [place] and they are take care of him.  She seems to be very caring. Flu shot today RTC 3 months.            COPD exacerbation in the context of chronic respiratory failure: Admitted to hospital and treated with standard protocols. He improved significantly and he feels back to normal. On lung exam, breath sounds are decreased but otherwise is clear. Plan:  Check CMP and CBC as requested by the hospital team. Finish prednisone as prescribed Continue his routine medicines. He does not have his portable oxygen tank with him, left it in the car, O2 sat however is good. Recommend to go back to oxygen ASAP. Osteoporosis: T-score -4.1.  Evenity none available from men.  First Prolia shot 03/14/2023. RTC  scheduled for October

## 2023-07-26 ENCOUNTER — Encounter: Payer: Self-pay | Admitting: Internal Medicine

## 2023-07-26 ENCOUNTER — Ambulatory Visit: Payer: Self-pay

## 2023-07-26 NOTE — Patient Outreach (Signed)
Care Coordination   Initial Visit Note   07/26/2023 Name: Bradley [name] Sr. MRN: [id number] DOB: 08-09-1941  Bradley Searles Sr. is a 82 y.o. year old male who sees Paz, Nolon Rod, MD for primary care. I spoke with  Bradley Searles Sr. by phone today.  What matters to the patients health and wellness today?  Referral per hospital liaison. Patient admitted 9/23-9/30 with acute encephalopathy, cellulitis. Patient lost wife last week. He reports he is doing ok and has family support. Bradley [name] reports he is doing good. He states his granddaughter lives with home and assist as needed. He also has a Grandson that he reports does not live in the area, but will come to assist if needed. Primary provider follow up visit completed on 07/25/23. Patient denies any questions or concerns at this time. Has follow up visit with orthopedic provider next week. Patient reports has not had physical therapy visit. RNCM contacted [facility]. Confirmed they are scheduled make home visit with patient this week.   Goals Addressed             This Visit's Progress    Continue to improve post hospitalization       Interventions Today    Flowsheet Row Most Recent Value  Chronic Disease   Chronic disease during today's visit Diabetes, Other, Chronic Obstructive Pulmonary Disease (COPD)  [admission 07/04/23-07/11/23 R LE ceulitis, encephalopathy.,fall.]  General Interventions   General Interventions Discussed/Reviewed General Interventions Discussed  [Evaluation of current treatment plan for health condition and patient's adherence to plan.]  Exercise Interventions   Exercise Discussed/Reviewed Assistive device use and maintanence  Education Interventions   Education Provided Provided Education  Provided Verbal Education On Medication, When to see the doctor  [advised to continue to take medications as prescribed, attend provider visits as scheduled, contact provider with health questions or concerns.]  Mental Health  Interventions   Mental Health Discussed/Reviewed Mental Health Discussed, Grief and Loss  [active listening and support.]  Nutrition Interventions   Nutrition Discussed/Reviewed Nutrition Discussed  [assess nutritional status-patient denies any difficulty obtaining food and reports meals prepared by either he or his family]  Pharmacy Interventions   Pharmacy Dicussed/Reviewed Pharmacy Topics Discussed  Pearletha Furl if patient has any medication quesitons/concerns. office visit completed with PCP on yesterday, also clinical pharmacist invovled iin care.]  Safety Interventions   Safety Discussed/Reviewed Safety Discussed, Fall Risk, Home Safety  Home Safety Assistive Devices, Contact home health agency  [facility] contacted home health regarding start of care date. per [facility] physical therapist to make visit this week. Last week patient declined due to passing of his wife.]            SDOH assessments and interventions completed:  No recently completed. Per patient no changes.   Care Coordination Interventions:  Yes, provided   Follow up plan: Follow up call scheduled for 08/17/23    Encounter Outcome:  Patient Visit Completed   Kathyrn Sheriff, RN, MSN, BSN, [facility] Care Management Coordinator [facility] [phone]

## 2023-07-26 NOTE — Assessment & Plan Note (Signed)
Cellulitis, falls,Encephalopathy, AKI: Admitted w/ these conditions, he is back to baseline and doing well.  Will recheck a BMP and CBC. DM: Reports good med compliance, check A1c, continue Farxiga COPD: At baseline. Stasis dermatitis: Was Dx with cellulitis at the hospital, now back to baseline.  See picture Social: Here with his granddaughter, lost his wife [name] last week.  Condolences provided.  Fortunately, granddaughter Hospital doctor, her daughter and fianc live with [place] and they are take care of him.  She seems to be very caring. Flu shot today RTC 3 months.

## 2023-07-26 NOTE — Patient Instructions (Signed)
Visit Information  Thank you for taking time to visit with me today. Please don't hesitate to contact me if I can be of assistance to you.   Following are the goals we discussed today:  Continue to take medications as prescribed. Continue to attend provider visits as scheduled Continue to eat healthy, lean meats, vegetables, fruits, avoid saturated and transfats Contact provider with health questions or concerns Work with therapist as recommended   Our next appointment is by telephone on 08/17/23 at 1:00 pm  Please call the care guide team at [phone] if you need to cancel or reschedule your appointment.   If you are experiencing a Mental Health or Behavioral Health Crisis or need someone to talk to, please call the Suicide and Crisis Lifeline: 988 call the [place] National Suicide Prevention Lifeline: 626-620-3718 or TTY: (936)455-6417 TTY 406-452-9419) to talk to a trained counselor call 1-800-273-TALK (toll free, 24 hour hotline)   Kathyrn Sheriff, RN, MSN, BSN, CCM Care Management Coordinator [phone]

## 2023-07-29 ENCOUNTER — Telehealth: Payer: Self-pay

## 2023-07-29 DIAGNOSIS — I428 Other cardiomyopathies: Secondary | ICD-10-CM

## 2023-07-29 DIAGNOSIS — I272 Pulmonary hypertension, unspecified: Secondary | ICD-10-CM

## 2023-07-29 DIAGNOSIS — I495 Sick sinus syndrome: Secondary | ICD-10-CM

## 2023-07-29 DIAGNOSIS — I5042 Chronic combined systolic (congestive) and diastolic (congestive) heart failure: Secondary | ICD-10-CM | POA: Diagnosis not present

## 2023-07-29 DIAGNOSIS — I2781 Cor pulmonale (chronic): Secondary | ICD-10-CM

## 2023-07-29 DIAGNOSIS — J9612 Chronic respiratory failure with hypercapnia: Secondary | ICD-10-CM

## 2023-07-29 DIAGNOSIS — N179 Acute kidney failure, unspecified: Secondary | ICD-10-CM | POA: Diagnosis not present

## 2023-07-29 DIAGNOSIS — E1151 Type 2 diabetes mellitus with diabetic peripheral angiopathy without gangrene: Secondary | ICD-10-CM

## 2023-07-29 DIAGNOSIS — I4891 Unspecified atrial fibrillation: Secondary | ICD-10-CM

## 2023-07-29 DIAGNOSIS — J449 Chronic obstructive pulmonary disease, unspecified: Secondary | ICD-10-CM

## 2023-07-29 DIAGNOSIS — J9611 Chronic respiratory failure with hypoxia: Secondary | ICD-10-CM | POA: Diagnosis not present

## 2023-07-29 DIAGNOSIS — I110 Hypertensive heart disease with heart failure: Secondary | ICD-10-CM | POA: Diagnosis not present

## 2023-07-29 NOTE — Telephone Encounter (Signed)
Plan of care signed and faxed to [facility] at [phone]. Form sent for scanning.

## 2023-08-04 NOTE — Plan of Care (Signed)
CHL Tonsillectomy/Adenoidectomy, Postoperative PEDS care plan entered in error.

## 2023-08-11 ENCOUNTER — Telehealth: Payer: Self-pay

## 2023-08-11 NOTE — Telephone Encounter (Signed)
Pt should receive his new monitor in 2-3 business days.

## 2023-08-15 LAB — CUP PACEART REMOTE DEVICE CHECK
Battery Remaining Longevity: 124 mo
Battery Voltage: 3.05 V
Brady Statistic AP VP Percent: 0 %
Brady Statistic AP VS Percent: 0 %
Brady Statistic AS VP Percent: 96.4 %
Brady Statistic AS VS Percent: 3.6 %
Brady Statistic RA Percent Paced: 0 %
Brady Statistic RV Percent Paced: 96.39 %
Date Time Interrogation Session: 20241104115909
Implantable Lead Connection Status: 753985
Implantable Lead Connection Status: 753985
Implantable Lead Implant Date: 20121128
Implantable Lead Implant Date: 20121128
Implantable Lead Location: 753858
Implantable Lead Location: 753860
Implantable Lead Model: 4194
Implantable Lead Model: 5076
Implantable Pulse Generator Implant Date: 20240131
Lead Channel Impedance Value: 3363 Ohm
Lead Channel Impedance Value: 3363 Ohm
Lead Channel Impedance Value: 342 Ohm
Lead Channel Impedance Value: 380 Ohm
Lead Channel Impedance Value: 437 Ohm
Lead Channel Impedance Value: 437 Ohm
Lead Channel Impedance Value: 494 Ohm
Lead Channel Impedance Value: 589 Ohm
Lead Channel Impedance Value: 741 Ohm
Lead Channel Pacing Threshold Amplitude: 0.625 V
Lead Channel Pacing Threshold Amplitude: 1.375 V
Lead Channel Pacing Threshold Pulse Width: 0.4 ms
Lead Channel Pacing Threshold Pulse Width: 0.4 ms
Lead Channel Sensing Intrinsic Amplitude: 11 mV
Lead Channel Sensing Intrinsic Amplitude: 11 mV
Lead Channel Setting Pacing Amplitude: 2 V
Lead Channel Setting Pacing Amplitude: 2 V
Lead Channel Setting Pacing Pulse Width: 0.4 ms
Lead Channel Setting Pacing Pulse Width: 0.4 ms
Lead Channel Setting Sensing Sensitivity: 4 mV
Zone Setting Status: 755011

## 2023-08-17 ENCOUNTER — Ambulatory Visit: Payer: Self-pay

## 2023-08-17 NOTE — Patient Outreach (Signed)
  Care Coordination   Follow Up Visit Note   08/17/2023 Name: Bradley CARTER Sr. MRN: [id number] DOB: [birth year]  Bradley Searles Sr. is a 82 y.o. year old male who sees Paz, Nolon Rod, MD for primary care. I spoke with  Bradley Searles Sr. by phone today.  What matters to the patients health and wellness today?  Mr. Plant reports he is doing well. He states home health PT has been out to see him, but does not know when to expect them back. RNCM placed call to [facility] home health who reports patient is seen weekly and is scheduled to be seen Friday. Patient with no other questions or concerns.  Goals Addressed             This Visit's Progress    Continue to improve post hospitalization       Interventions Today    Flowsheet Row Most Recent Value  Chronic Disease   Chronic disease during today's visit Chronic Obstructive Pulmonary Disease (COPD), Hypertension (HTN), Other  [admission 07/04/23-07/11/23 R LE ceulitis, encephalopathy.,fall.)]  General Interventions   General Interventions Discussed/Reviewed General Interventions Reviewed  [Evaluation of current treatment plan for health condition and patient's adherence to plan.]  Exercise Interventions   Exercise Discussed/Reviewed Exercise Discussed  Education Interventions   Education Provided Provided Education  Provided Verbal Education On Medication, When to see the doctor, Nutrition  Safety Interventions   Safety Discussed/Reviewed Safety Reviewed, Home Safety, Fall Risk  Home Safety Contact home health agency  [facility] contacted [facility]([phone]) who reports PT continues to be active with patient. visits completed once a week.]            SDOH assessments and interventions completed:  No  Care Coordination Interventions:  Yes, provided   Follow up plan: Follow up call scheduled for 09/22/23    Encounter Outcome:  Patient Visit Completed   Kathyrn Sheriff, RN, MSN, BSN, CCM Care Management Coordinator [phone]

## 2023-08-17 NOTE — Patient Instructions (Signed)
Visit Information  Thank you for taking time to visit with me today. Please don't hesitate to contact me if I can be of assistance to you.   Following are the goals we discussed today:  Continue to take medications as prescribed. Continue to attend provider visits as scheduled Continue to eat healthy, lean meats, vegetables, fruits, avoid saturated and transfats Contact provider with health questions or concerns as needed Work with home health therapist as recommended  Our next appointment is by telephone on 09/22/23 at 11:30 am  Please call the care guide team at [phone] if you need to cancel or reschedule your appointment.   If you are experiencing a Mental Health or Behavioral Health Crisis or need someone to talk to, please call the Suicide and Crisis Lifeline: 988 call the [place] National Suicide Prevention Lifeline: 681-650-3608 or TTY: 351-327-7072 TTY 517-862-5839) to talk to a trained counselor call 1-800-273-TALK (toll free, 24 hour hotline)  Kathyrn Sheriff, RN, MSN, BSN, CCM Care Management Coordinator [phone]

## 2023-08-19 ENCOUNTER — Other Ambulatory Visit (HOSPITAL_BASED_OUTPATIENT_CLINIC_OR_DEPARTMENT_OTHER): Payer: Self-pay | Admitting: Cardiovascular Disease

## 2023-08-22 ENCOUNTER — Telehealth: Payer: Self-pay

## 2023-08-22 NOTE — Telephone Encounter (Signed)
Prolia VOB initiated via [email or web address]  Next Prolia inj DUE:  09/13/23

## 2023-08-26 ENCOUNTER — Other Ambulatory Visit (HOSPITAL_COMMUNITY): Payer: Self-pay

## 2023-08-26 NOTE — Telephone Encounter (Signed)
Pt ready for scheduling for Prolia on or after : 09/13/23  Out-of-pocket cost due at time of visit: $0 (medical buy and bill)  Primary: [employer] Prolia co-insurance: 0% Admin fee co-insurance: 0%  Secondary: [employer] [place] [employer] Prolia co-insurance:  Admin fee co-insurance:   Medical Benefit Details: Date Benefits were checked: 08/25/23 Deductible: $240 (met)/ Coinsurance: 0%/ Admin Fee: 0%  Prior Auth: not required PA# Expiration Date:   # of doses approved:  Pharmacy benefit: Copay $-- If patient wants fill through the pharmacy benefit please send prescription to:  --- , and include estimated need by date in rx notes. Pharmacy will ship medication directly to the office.  Patient not eligible for Prolia Copay Card. Copay Card can make patient's cost as little as $25. Link to apply: [email or web address]://www.amgensupportplus.[email or web address]  ** This summary of benefits is an estimation of the patient's out-of-pocket cost. Exact cost may very based on individual plan coverage.

## 2023-08-26 NOTE — Telephone Encounter (Signed)
Tried calling, pts vm has not been set up yet.

## 2023-08-29 NOTE — Telephone Encounter (Signed)
Tried calling, pts vm has not been set up yet.

## 2023-08-29 NOTE — Telephone Encounter (Signed)
Mychart message sent to the pt.

## 2023-08-31 ENCOUNTER — Encounter: Payer: Self-pay | Admitting: *Deleted

## 2023-08-31 NOTE — Telephone Encounter (Signed)
Letter sent as well.

## 2023-09-19 ENCOUNTER — Encounter (HOSPITAL_BASED_OUTPATIENT_CLINIC_OR_DEPARTMENT_OTHER): Payer: Self-pay | Admitting: Cardiovascular Disease

## 2023-09-19 ENCOUNTER — Ambulatory Visit (INDEPENDENT_AMBULATORY_CARE_PROVIDER_SITE_OTHER): Payer: Medicare Other | Admitting: Cardiovascular Disease

## 2023-09-19 VITALS — BP 108/70 | HR 110 | Ht 68.5 in | Wt 174.2 lb

## 2023-09-19 DIAGNOSIS — I5032 Chronic diastolic (congestive) heart failure: Secondary | ICD-10-CM

## 2023-09-19 DIAGNOSIS — E1169 Type 2 diabetes mellitus with other specified complication: Secondary | ICD-10-CM

## 2023-09-19 DIAGNOSIS — I272 Pulmonary hypertension, unspecified: Secondary | ICD-10-CM | POA: Diagnosis not present

## 2023-09-19 DIAGNOSIS — E782 Mixed hyperlipidemia: Secondary | ICD-10-CM

## 2023-09-19 DIAGNOSIS — I10 Essential (primary) hypertension: Secondary | ICD-10-CM

## 2023-09-19 DIAGNOSIS — I739 Peripheral vascular disease, unspecified: Secondary | ICD-10-CM | POA: Diagnosis not present

## 2023-09-19 DIAGNOSIS — I2781 Cor pulmonale (chronic): Secondary | ICD-10-CM

## 2023-09-19 DIAGNOSIS — I2510 Atherosclerotic heart disease of native coronary artery without angina pectoris: Secondary | ICD-10-CM

## 2023-09-19 DIAGNOSIS — I4891 Unspecified atrial fibrillation: Secondary | ICD-10-CM | POA: Diagnosis not present

## 2023-09-19 DIAGNOSIS — J449 Chronic obstructive pulmonary disease, unspecified: Secondary | ICD-10-CM

## 2023-09-19 DIAGNOSIS — I495 Sick sinus syndrome: Secondary | ICD-10-CM

## 2023-09-19 DIAGNOSIS — E1159 Type 2 diabetes mellitus with other circulatory complications: Secondary | ICD-10-CM

## 2023-09-19 DIAGNOSIS — I428 Other cardiomyopathies: Secondary | ICD-10-CM

## 2023-09-19 NOTE — Progress Notes (Signed)
Cardiology Office Note:  .   Date:  09/19/2023  ID:  Bradley Searles Sr., DOB 09-01-1941, MRN [id number] PCP: Bradley Plump, MD  [facility] Providers Cardiologist:  None    History of Present Illness: .   Bradley Searles Sr. is a 82 y.o. male with chronic atrial fibrillation s/p AVN ablation and CRT-P, chronic diastolic heart failure, CAD, hypertension, hyperlipidemia, prior ICH (no anticoagulation), COPD on home O2, and PAD who presents for follow-up.  He previously underwent left heart catheterization in 2005 and in 2018 and had no significant coronary artery disease. He had a BiV pacemaker and AV nodal ablation 09/08/11. At that time he had reduced systolic function in the setting of atrial fibrillation with RVR. After placement of the biventricular pacemaker his ejection fraction improved to 50-55% in 2017.  He was admitted 02/2017 with COPD exacerbation and acute on chronic diastolic heart failure. BNP was 491 and TEE had pleural effusions on chest x-ray. He improved with diuresis. Echocardiogram at that time revealed LVEF 55-60%, mild mitral regurgitation, and PA SP 55 mmHg.  He followed up with Bradley [name] on 03/09/17 and was doing well.  Bradley [name] was admitted 12/2018 with a COPD exacerbation  He developed increasing shortness of breath and cough.  In the hospital he required BiPAP, IV steroids and antibiotics.  He was again admitted 04/2019 with acute respiratory failure due to both COPD exacerbation and acute on chronic diastolic heart failure.  He followed up with Bradley Shelter, PA-C on 05/2019 and his weight was stable at 206 pounds.     Bradley [name] was admitted 09/2019 with a COPD exacerbation requiring antibiotics and steroids.  He had an echo 08/2020 that revealed LVEF 55 to 60% with mild LVH.  Right ventricular function was normal.  PASP was 50.6 mmHg.  He was then admitted 03/2021 with COPD exacerbation and acute on chronic diastolic heart failure.  Since discharge he had been feeling well.      On 07/29/2021 he was admitted with acute on chronic respiratory failure with hypoxia, acute on chronic systolic CHF, and COPD with acute exacerbation. While in the hospital he was seen by Heart Failure Service. He was treated with steroids and IV Lasix. He followed up with Heart Failure on 11/22 and was doing well. Bradley [name] was added to his regimen. Bradley [name] was switched from losartan to Bradley [name].  For unclear reasons Bradley [name] was discontinued during one of his hospitalizations.  He was admitted 09/2022 with COVID-19 pneumonia.  His pacemaker was near EOL and replaced 10/2022.  At his clinic visit 03/2023 he was doing well.  Bradley [name] was hospitalized 06/2023 with encephalopathy and falls.  He had right lower extremity cellulitis secondary to Bradley [name].  He also had Bradley [name] bacteremia.  He was treated with IV ceftriaxone and discharged on Augmentin.  He complained of left-sided chest pain on admission.  However his pain was reproducible with palpation.  Echo at the time revealed LVEF 50-55% with mild LVH and no wall motion abnormalities.  He had moderate tricuspid regurgitation.  RVSP 56 mmHg.  Bradley [name] presents for a routine follow-up. He reports feeling well overall, with no new health concerns since his last visit. He has been maintaining an active lifestyle, including regular physical therapy, which he reports has been beneficial. However, he notes that his physical therapy sessions have recently completed.  Bradley [name] reports occasional chest pressure, primarily occurring late at night, which he associates with stress. His wife recently passed  away.  Several family members have been helping to take care of him.  He denies experiencing this pressure during physical activity or therapy. He also denies any recent swelling, dizziness, or lightheadedness.  He is on a regimen of amlodipine and bisoprolol for blood pressure management, aspirin, and atorvastatin for cholesterol control. He reports  occasional use of an albuterol inhaler for COPD management.  The patient's blood pressure has been running low, with a recent reading of 108/70. He has been advised to monitor his blood pressure at home and report if it drops into the 90s.     ROS:  As per HPI  Studies Reviewed: .       Echo 06/2023:  1. Left ventricular ejection fraction, by estimation, is 50 to 55%. The left ventricle has low normal function. The left ventricle has no regional wall motion abnormalities. There is mild left ventricular hypertrophy. Left ventricular diastolic parameters are indeterminate.  2. Right ventricular systolic function was not well visualized. The right ventricular size is not well visualized.  3. Left atrial size was severely dilated.  4. Right atrial size was severely dilated.  5. The mitral valve is normal in structure. Mild mitral valve regurgitation. No evidence of mitral stenosis.  6. The tricuspid valve is abnormal. Tricuspid valve regurgitation is moderate.  7. The aortic valve is tricuspid. Aortic valve regurgitation is not visualized. Aortic valve sclerosis/calcification is present, without any evidence of aortic stenosis.  8. The inferior vena cava is normal in size with greater than 50% respiratory variability, suggesting right atrial pressure of 3 mmHg.   Risk Assessment/Calculations:    CHA2DS2-VASc Score = 5   This indicates a 7.2% annual risk of stroke. The patient's score is based upon: CHF History: 1 HTN History: 1 Diabetes History: 0 Stroke History: 0 Vascular Disease History: 1 Age Score: 2 Gender Score: 0            Physical Exam:   VS:  BP 108/70 (BP Location: Right Arm, Patient Position: Sitting, Cuff Size: Large)   Pulse (!) 110   Ht 5' 8.5" (1.74 m)   Wt 174 lb 3.2 oz (79 kg)   SpO2 90%   BMI 26.10 kg/m  , BMI Body mass index is 26.1 kg/m. GENERAL:  Well appearing HEENT: Pupils equal round and reactive, fundi not visualized, oral mucosa  unremarkable NECK:  No jugular venous distention, waveform within normal limits, carotid upstroke brisk and symmetric, no bruits, no thyromegaly LUNGS:  Poor air movement.  Expiratory wheezes HEART:  RRR.  PMI not displaced or sustained,S1 and S2 within normal limits, no S3, no S4, no clicks, no rubs, II/VI sytstolic murmur at LUSB ABD:  Flat, positive bowel sounds normal in frequency in pitch, no bruits, no rebound, no guarding, no midline pulsatile mass, no hepatomegaly, no splenomegaly EXT:  2 plus pulses throughout, no edema, no cyanosis no clubbing SKIN:  No rashes no nodules NEURO:  Cranial nerves II through XII grossly intact, motor grossly intact throughout PSYCH:  Cognitively intact, oriented to person place and time   ASSESSMENT AND PLAN: .    # HFpEF:  He is euvolemic and doing well. Continue BP control.  Continue Farxiga and lasix.  # CAD:  He has no ischemic symptoms.  Continue aspirin, statin and bisoprolol.  # Persistent atrial fibrillation.  S/p AV node ablation and CRT-P.  Continue bisoprolol.  He is not on anticoagulation 2/2 prior intracranial hemorrhage.  # Chronic Obstructive Pulmonary Disease (COPD) He is  on chronic oxygen.  Poor air movement noted, tightness in lungs and wheezing on exam. Patient has albuterol inhaler at home but unsure of usage. No recent follow-up with pulmonology. -Recommend reestablishing care with pulmonology for management of COPD. -Ensure correct usage of albuterol inhaler at home. -Resume Combivent inhaler  # Hypertension Blood pressure on the lower side (108/70) but patient denies any symptoms of lightheadedness or dizziness. -Continue current antihypertensive medications (amlodipine and bisoprolol). -Recommend patient to monitor blood pressure at home and ensure it remains above 100 systolic.  General Health Maintenance -Plan to recheck cholesterol levels at next appointment with [name] on January 27th, 2025. -Return visit in 6  months unless issues arise sooner.     #  Hyperlipidemia:  Continue atorvastatin.      Dispo: f/u 6 months.  Signed, [name], MD

## 2023-09-19 NOTE — Patient Instructions (Signed)
Medication Instructions:  Your physician recommends that you continue on your current medications as directed. Please refer to the Current Medication list given to you today.   *If you need a refill on your cardiac medications before your next appointment, please call your pharmacy*  Lab Work: NONE  Testing/Procedures: NONE  Follow-Up: At [facility], you and your health needs are our priority.  As part of our continuing mission to provide you with exceptional heart care, we have created designated Provider Care Teams.  These Care Teams include your primary Cardiologist (physician) and Advanced Practice Providers (APPs -  Physician Assistants and Nurse Practitioners) who all work together to provide you with the care you need, when you need it.  We recommend signing up for the patient portal called "MyChart".  Sign up information is provided on this After Visit Summary.  MyChart is used to connect with patients for Virtual Visits (Telemedicine).  Patients are able to view lab/test results, encounter notes, upcoming appointments, etc.  Non-urgent messages can be sent to your provider as well.   To learn more about what you can do with MyChart, go to [email or web address].    Your next appointment:   6 month(s)  Provider:   [name], MD or Gillian Shields, NP    FOLLOW UP WITH PULMONARY AS SOON AS POSSIBLE

## 2023-09-22 ENCOUNTER — Ambulatory Visit: Payer: Self-pay

## 2023-09-22 NOTE — Patient Instructions (Signed)
Visit Information  Thank you for taking time to visit with me today. Please don't hesitate to contact me if I can be of assistance to you.   Following are the goals we discussed today:  Continue to take medications as prescribed. Continue to attend provider visits as scheduled Continue to eat healthy, lean meats, vegetables, fruits, avoid saturated and transfats Contact provider with health questions or concerns as needed Remain as active as tolerated Fall prevention strategies: change positions slowly, maintain muscle tone/strength, remain as active as possible, maintain good lighting.   If you are experiencing a Mental Health or Behavioral Health Crisis or need someone to talk to, please call the Suicide and Crisis Lifeline: 988 call the [place] National Suicide Prevention Lifeline: (306) 569-2981 or TTY: 347-616-4737 TTY 2282402585) to talk to a trained counselor   Kathyrn Sheriff, RN, MSN, BSN, CCM Care Management Coordinator [phone]

## 2023-09-22 NOTE — Patient Outreach (Signed)
  Care Coordination   Follow Up Visit Note   09/22/2023 Name: Bradley MASSENA Sr. MRN: [id number] DOB: 20-Oct-1940  Bradley Searles Sr. is a 82 y.o. year old male who sees Paz, Nolon Rod, MD for primary care. I spoke with  Bradley Searles Sr. by phone today.  What matters to the patients health and wellness today?  [name] reports he is doing well. Office visit with cardiologist completed on 09/19/23. He states he has scheduled a follow up appointment with pulmonologist as recommended by cardiologist. [name] reports he has all his medications and denies any questions. He denies any questions or concerns. No care management needs identified at this time. [name] states he will call RNCM if care management needs in the future. RNCM confirmed he has RNCM's contact number.  Goals Addressed             This Visit's Progress    COMPLETED: Continue to improve post hospitalization       Interventions Today    Flowsheet Row Most Recent Value  Chronic Disease   Chronic disease during today's visit Chronic Obstructive Pulmonary Disease (COPD), Hypertension (HTN), Other  [(admission 07/04/23-07/11/23 R LE ceulitis, encephalopathy.,fall.]  General Interventions   General Interventions Discussed/Reviewed General Interventions Reviewed, Doctor Visits  [encouraged to contact RNCM or primary care provider if care management needs in the future]  Doctor Visits Discussed/Reviewed Doctor Visits Discussed, PCP  PCP/Specialist Visits Compliance with follow-up visit  Education Interventions   Education Provided Provided Education  Provided Verbal Education On Exercise, Medication, When to see the doctor  [advised to continue to take medications as prescribed, attend provider visits as scheduled, remain active and maintain fall prevention strategies.]  Pharmacy Interventions   Pharmacy Dicussed/Reviewed Pharmacy Topics Reviewed            SDOH assessments and interventions completed:  No  Care  Coordination Interventions:  Yes, provided   Follow up plan: No further intervention required.   Encounter Outcome:  Patient Visit Completed   Kathyrn Sheriff, RN, MSN, BSN, CCM Care Management Coordinator [phone]

## 2023-09-23 NOTE — Telephone Encounter (Signed)
Left message for granddaughter to let pt know to call for Prolia injection.  He is due now.

## 2023-09-26 ENCOUNTER — Ambulatory Visit (INDEPENDENT_AMBULATORY_CARE_PROVIDER_SITE_OTHER): Payer: Medicare Other | Admitting: Pharmacist

## 2023-09-26 DIAGNOSIS — I10 Essential (primary) hypertension: Secondary | ICD-10-CM

## 2023-09-26 DIAGNOSIS — E1159 Type 2 diabetes mellitus with other circulatory complications: Secondary | ICD-10-CM

## 2023-09-26 DIAGNOSIS — I5032 Chronic diastolic (congestive) heart failure: Secondary | ICD-10-CM

## 2023-09-26 DIAGNOSIS — J449 Chronic obstructive pulmonary disease, unspecified: Secondary | ICD-10-CM

## 2023-09-26 NOTE — Progress Notes (Signed)
Pharmacy Note  09/26/2023 Name: Bradley DEMORE Sr. MRN: [id number] DOB: December 19, 1940  Subjective: Bradley Searles Sr. is a 82 y.o. year old male who is a primary care patient of [name], MD. Clinical Pharmacist Practitioner referral was placed to assist with medication management.    Engaged with patient by telephone for follow up visit today.  COPD -  Next pulmonology appointment is scheduled for 11/09/2023 with Dr Allison Quarry.  Current therapy: budesonide nebs twice a day and Duonebs as needed.  O2 setting is 4L   Patient also has albuterol inhaler to use when away from home as needed for shortness of breath and wheezing. He reports that he rarely needs to use albuterol. Reports no concerns with breathing.   Hypertension / CHF -  Current therapy: amlodipine 10mg  daily, furosemide 40mg  twice a day, Farxiga 10mg  daily and bisoprolol 10mg  daily.  Checking blood pressure at home daily. He reports blood pressure has been good but did not have any specific number written down.   BP Readings from Last 3 Encounters:  09/19/23 108/70  07/25/23 116/62  07/11/23 123/68    Patient is checking weight daily. Reports weight has been stable. Only varies by 1 to 2 lbs.   He endorsed that he knows to contact office if has weight gain more than 3 lbs in 24 hours or 5lbs in 1 week or if he is having symptoms of worsening heart failure.   Hyperlipidemia:  Current therapy: atorvastatin 40mg  daily.  Dose of atorvastatin was increased about 1 year ago. LDL decreased from 108 (10/19/2022) to 76 (11/15/2022).   Osteoporosis:  Current therapy - prolia 60mg  every 6 months.  03/14/2023 received Prolia- next due around 09/13/2023. He has appointment scheduled fro 10/06/2023.  Cost of Prolia for upcomtin dose was $0 Calcium + D once daily and MVI once daily   Dr Drue Novel initially recommended Evenity x 12 months but was denied by insurance.   DEXA 02/17/2023 - Forearm Radius 33% T-score of -4.1  Lumbar spine  was not utilized due to advanced degenerative changes.  DualFemur Total Mean  -2.7  Left Forearm Radius 33% was -4.1  Objective: Review of patient status, including review of consultants reports, laboratory and other test data, was performed as part of comprehensive evaluation and provision of chronic care management services.   Lab Results  Component Value Date   CREATININE 0.91 07/25/2023   CREATININE 0.49 (L) 07/11/2023   CREATININE 0.76 07/07/2023    Lab Results  Component Value Date   HGBA1C 6.2 07/25/2023       Component Value Date/Time   CHOL 142 11/15/2022 0931   TRIG 76 11/15/2022 0931   HDL 54 11/15/2022 0931   CHOLHDL 2.6 11/15/2022 0931   CHOLHDL 3.1 10/07/2022 0506   VLDL 13 10/07/2022 0506   LDLCALC 73 11/15/2022 0931   LDLDIRECT 76 11/15/2022 0928   Lab Results  Component Value Date   CHOL 142 11/15/2022   CHOL 180 10/07/2022   CHOL 141 11/11/2021   Lab Results  Component Value Date   HDL 54 11/15/2022   HDL 58 10/07/2022   HDL 41.10 11/11/2021   Lab Results  Component Value Date   LDLCALC 73 11/15/2022   LDLCALC 109 (H) 10/07/2022   LDLCALC 70 11/11/2021   Lab Results  Component Value Date   TRIG 76 11/15/2022   TRIG 67 10/07/2022   TRIG 148.0 11/11/2021   Lab Results  Component Value Date   CHOLHDL  2.6 11/15/2022   CHOLHDL 3.1 10/07/2022   CHOLHDL 3 11/11/2021   Lab Results  Component Value Date   LDLDIRECT 76 11/15/2022   LDLDIRECT 108 (H) 10/19/2022     Clinical ASCVD: Yes  The ASCVD Risk score (Arnett DK, et al., 2019) failed to calculate for the following reasons:   The 2019 ASCVD risk score is only valid for ages 89 to 83   Risk score cannot be calculated because patient has a medical history suggesting prior/existing ASCVD    BP Readings from Last 3 Encounters:  09/19/23 108/70  07/25/23 116/62  07/11/23 123/68     Allergies  Allergen Reactions   Hydrocodone Other (See Comments)    "given to him in the hospital;  went thru withdrawals once home; dr said not to take it again" (09/27/2012) dizziness   Ultram [Tramadol] Other (See Comments)    "given to him in the hospital; went thru withdrawals once home; dr said not to take it again" (09/27/2012) dizziness   Cialis [Tadalafil] Other (See Comments)    Headache Backache  Dizziness    Avelox [Moxifloxacin Hydrochloride] Itching and Other (See Comments)    Headache Dizziness    Medications Reviewed Today     Reviewed by Henrene Pastor, RPH-CPP (Pharmacist) on 09/26/23 at 1040  Med List Status: <None>   Medication Order Taking? Sig Documenting Provider Last Dose Status Informant  acetaminophen (TYLENOL) 500 MG tablet [id number]  Take 1,000 mg by mouth every 8 (eight) hours as needed for headache or moderate pain. [provider]  Active Family Member, Pharmacy Records  amLODipine (NORVASC) 10 MG tablet [id number] Yes Take 1 tablet (10 mg total) by mouth daily. [name], MD Taking Active Family Member, Pharmacy Records  aspirin 81 MG tablet [id number] Yes Take 81 mg by mouth every morning.  [provider] Taking Active Family Member, Pharmacy Records  atorvastatin (LIPITOR) 40 MG tablet [id number] Yes Take 1 tablet (40 mg total) by mouth at bedtime. Alver Sorrow, NP Taking Active Family Member, Pharmacy Records           Med Note Roxan Hockey, [place] R   Wed Nov 10, 2022  1:24 PM)    bisoprolol (ZEBETA) 10 MG tablet [id number] Yes TAKE 1 TABLET DAILY [name], MD Taking Active Family Member, Pharmacy Records  calcium-vitamin D [facility] WITH D) 500-200 MG-UNIT tablet [id number] Yes Take 1 tablet by mouth every morning. [provider] Taking Active Family Member, Pharmacy Records  cholecalciferol (VITAMIN D3) 25 MCG (1000 UNIT) tablet [id number] Yes Take 1,000 Units by mouth every evening. [provider] Taking Active Family Member, Pharmacy Records  dapagliflozin propanediol (FARXIGA) 10 MG TABS tablet [id number] Yes Take  1 tablet (10 mg total) by mouth daily before breakfast. [name], MD Taking Active Family Member, Pharmacy Records  diphenhydramine-acetaminophen (TYLENOL PM) 25-500 MG TABS tablet [id number] Yes Take 1 tablet by mouth at bedtime as needed (sleep). [provider] Taking Active Family Member, Pharmacy Records  furosemide (LASIX) 40 MG tablet [id number] Yes TAKE 2 TABLETS EVERY MORNING AND 1 TABLET LATE IN THE AFTERNOON [name], MD Taking Active   glucose blood [facility] VERIO) test strip [id number] Yes USE TO CHECK BLOOD SUGAR NO MORE THAN TWICE A DAY [name], MD Taking Active Family Member, Pharmacy Records  Lancets [facility] ULTRASOFT) lancets [id number] Yes Check blood sugars no more than twice daily [name], MD Taking Active Family Member, Pharmacy Records  Multiple Vitamins-Minerals (MULTIVITAMINS THER.  W/MINERALS) TABS tablet [id number] Yes Take 1 tablet by mouth daily. [name], MD Taking Active Family Member, Pharmacy Records  nitroGLYCERIN (NITROSTAT) 0.4 MG SL tablet [id number] Yes Place 1 tablet (0.4 mg total) under the tongue every 5 (five) minutes x 3 doses as needed for chest pain. [name], MD Taking Active Family Member, Pharmacy Records           Med Note [place], [facility] D   Tue Feb 15, 2023  2:11 PM) PRN  OXYGEN [id number] Yes Inhale 4 L/min into the lungs continuous. [provider] Taking Active Family Member, Pharmacy Records  pantoprazole (PROTONIX) 40 MG tablet [id number] Yes Take 1 tablet (40 mg total) by mouth daily before breakfast. [name], MD Taking Active Family Member, Pharmacy Records  tamsulosin [facility]) 0.4 MG CAPS capsule [id number] Yes Take 1 capsule (0.4 mg total) by mouth daily after supper. [name], MD Taking Active Family Member, Pharmacy Records            Patient Active Problem List   Diagnosis Date Noted   AKI (acute kidney injury) (HCC) 07/04/2023   Cellulitis of right leg 07/04/2023   Acute encephalopathy  07/04/2023   Viral pneumonia 05/26/2023   Acute on chronic congestive heart failure (HCC) 10/05/2022   Acute respiratory failure with hypoxemia (HCC) 08/27/2022   COVID 08/23/2022   Displaced fracture of shaft of third metacarpal bone, left hand, sub encounter for closed fracture 08/03/2022   ILD (interstitial lung disease) (HCC) 07/30/2022   CAD (coronary artery disease) 07/19/2022   Acute on chronic respiratory failure with hypoxia (HCC) 07/18/2022   GERD without esophagitis 07/30/2021   Cor pulmonale (chronic) (HCC) 08/05/2020   COPD exacerbation (HCC) 12/22/2018   COPD,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 12/22/2018   PCP NOTES >>> 07/15/2015   Annual physical exam 05/08/2012   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   Sinoatrial node dysfunction (HCC)    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   Chronic diastolic heart failure (HCC)    PAD (peripheral artery disease) (HCC) 10/22/2010   Chronic dermatitis 09/14/2010   Chronic respiratory failure with hypoxia and hypercapnia (HCC) 09/14/2010   Chronic pulmonary hypertension secondary to elevated L H pressures  04/15/2009   ERECTILE DYSFUNCTION 01/06/2009   Mixed diabetic hyperlipidemia associated with type 2 diabetes mellitus (HCC) 12/13/2007   Essential hypertension 12/13/2007   Anxiety--insomnia 08/14/2007   Type 2 diabetes mellitus (HCC) 01/02/2007   Atrial fibrillation (HCC) 01/02/2007   Benign enlargement of prostate 01/02/2007     Medication Assistance:  None required.  Patient affirms current coverage meets needs. [place] has Medicare Part D coverage and additional coverage thru [employer]  retirement benefits.    Assessment / Plan:  Hypertension / CHF: goal blood pressure < 130/80 Continue bisoprolol 10mg  daily and amlodipine 10mg  daily for blood pressure.  Continue Farxiga and furosemide for CHF Continue to check weight daily Continue to limit intake of sodium in diet.  Check blood pressure 2 to 3 times per week and  record (can record on same sheet as weight)   COPD:  Continue budesonide and DuoNeb nebs  Reminded to use albuterol in need when away from home and gets shortness of breath   Hyperlipidemia: goal LDL < 70 Continue to take atorvastatin 40mg  daily  Osteoporosis:  Continue Prolia 60mg  every 6 months. Next dose scheduled for 10/06/2023 Continue Calcium + D once daily and MVI once daily   Medication Management:  Reviewed med list and updated Reviewed refill history  and adherence.   Follow Up:  Telephone follow up appointment with care management team member scheduled for:  3 to 6 months   Henrene Pastor, PharmD Clinical Pharmacist [facility]  - [facility]

## 2023-10-03 ENCOUNTER — Encounter (HOSPITAL_BASED_OUTPATIENT_CLINIC_OR_DEPARTMENT_OTHER): Payer: Self-pay

## 2023-10-03 ENCOUNTER — Other Ambulatory Visit: Payer: Self-pay

## 2023-10-03 ENCOUNTER — Emergency Department (HOSPITAL_BASED_OUTPATIENT_CLINIC_OR_DEPARTMENT_OTHER)
Admission: EM | Admit: 2023-10-03 | Discharge: 2023-10-03 | Disposition: A | Discharge status: Home / Self Care | Payer: Medicare Other | Attending: Emergency Medicine | Admitting: Emergency Medicine

## 2023-10-03 ENCOUNTER — Emergency Department (HOSPITAL_BASED_OUTPATIENT_CLINIC_OR_DEPARTMENT_OTHER): Payer: Medicare Other

## 2023-10-03 DIAGNOSIS — E119 Type 2 diabetes mellitus without complications: Secondary | ICD-10-CM | POA: Diagnosis not present

## 2023-10-03 DIAGNOSIS — J441 Chronic obstructive pulmonary disease with (acute) exacerbation: Secondary | ICD-10-CM | POA: Diagnosis not present

## 2023-10-03 DIAGNOSIS — Z20822 Contact with and (suspected) exposure to covid-19: Secondary | ICD-10-CM | POA: Insufficient documentation

## 2023-10-03 DIAGNOSIS — Z7952 Long term (current) use of systemic steroids: Secondary | ICD-10-CM | POA: Diagnosis not present

## 2023-10-03 DIAGNOSIS — R079 Chest pain, unspecified: Secondary | ICD-10-CM | POA: Diagnosis present

## 2023-10-03 DIAGNOSIS — Z7982 Long term (current) use of aspirin: Secondary | ICD-10-CM | POA: Diagnosis not present

## 2023-10-03 DIAGNOSIS — R0789 Other chest pain: Secondary | ICD-10-CM

## 2023-10-03 LAB — BASIC METABOLIC PANEL
Anion gap: 9 (ref 5–15)
BUN: 11 mg/dL (ref 8–23)
CO2: 34 mmol/L — ABNORMAL HIGH (ref 22–32)
Calcium: 8.9 mg/dL (ref 8.9–10.3)
Chloride: 95 mmol/L — ABNORMAL LOW (ref 98–111)
Creatinine, Ser: 0.82 mg/dL (ref 0.61–1.24)
GFR, Estimated: >60 mL/min (ref >60)
Glucose, Bld: 137 mg/dL — ABNORMAL HIGH (ref 70–99)
Potassium: 3.9 mmol/L (ref 3.5–5.1)
Sodium: 138 mmol/L (ref 135–145)

## 2023-10-03 LAB — CBC
HCT: 42.2 % (ref 39.0–52.0)
Hemoglobin: 14.2 g/dL (ref 13.0–17.0)
MCH: 30.7 pg (ref 26.0–34.0)
MCHC: 33.6 g/dL (ref 30.0–36.0)
MCV: 91.3 fL (ref 80.0–100.0)
Platelets: 279 10*3/uL (ref 150–400)
RBC: 4.62 MIL/uL (ref 4.22–5.81)
RDW: 13.9 % (ref 11.5–15.5)
WBC: 9.5 10*3/uL (ref 4.0–10.5)
nRBC: 0 % (ref 0.0–0.2)

## 2023-10-03 LAB — RESP PANEL BY RT-PCR (RSV, FLU A&B, COVID)  RVPGX2
Influenza A by PCR: NEGATIVE
Influenza B by PCR: NEGATIVE
Resp Syncytial Virus by PCR: NEGATIVE
SARS Coronavirus 2 by RT PCR: NEGATIVE

## 2023-10-03 LAB — TROPONIN I (HIGH SENSITIVITY)
Troponin I (High Sensitivity): 12 ng/L (ref <18)
Troponin I (High Sensitivity): 10 ng/L (ref <18)

## 2023-10-03 LAB — BRAIN NATRIURETIC PEPTIDE: B Natriuretic Peptide: 382.7 pg/mL — ABNORMAL HIGH (ref 0.0–100.0)

## 2023-10-03 MED ORDER — METHYLPREDNISOLONE SODIUM SUCC 125 MG IJ SOLR
125.0000 mg | Freq: Once | INTRAMUSCULAR | Status: AC
  Administered 2023-10-03: 125 mg via INTRAVENOUS
  Filled 2023-10-03: qty 2

## 2023-10-03 MED ORDER — PREDNISONE 20 MG PO TABS: 60.0000 mg | ORAL_TABLET | Freq: Every day | ORAL | 0 refills | Status: AC

## 2023-10-03 MED ORDER — ACETAMINOPHEN 325 MG PO TABS
650.0000 mg | ORAL_TABLET | Freq: Once | ORAL | Status: AC
  Administered 2023-10-03: 650 mg via ORAL
  Filled 2023-10-03: qty 2

## 2023-10-03 MED ORDER — LIDOCAINE 5 % EX PTCH
1.0000 | MEDICATED_PATCH | CUTANEOUS | Status: DC
  Administered 2023-10-03: 1 via TRANSDERMAL
  Filled 2023-10-03: qty 1

## 2023-10-03 MED ORDER — IPRATROPIUM-ALBUTEROL 0.5-2.5 (3) MG/3ML IN SOLN
3.0000 mL | Freq: Once | RESPIRATORY_TRACT | Status: AC
  Administered 2023-10-03: 3 mL via RESPIRATORY_TRACT
  Filled 2023-10-03: qty 3

## 2023-10-03 MED ORDER — DOXYCYCLINE HYCLATE 100 MG PO CAPS: 100.0000 mg | ORAL_CAPSULE | Freq: Two times a day (BID) | ORAL | 0 refills | Status: AC

## 2023-10-03 MED ORDER — BENZONATATE 100 MG PO CAPS
100.0000 mg | ORAL_CAPSULE | Freq: Once | ORAL | Status: AC
  Administered 2023-10-03: 100 mg via ORAL
  Filled 2023-10-03: qty 1

## 2023-10-03 MED ORDER — DOXYCYCLINE HYCLATE 100 MG PO TABS
100.0000 mg | ORAL_TABLET | Freq: Once | ORAL | Status: AC
  Administered 2023-10-03: 100 mg via ORAL
  Filled 2023-10-03: qty 1

## 2023-10-03 NOTE — Discharge Instructions (Addendum)
As discussed, your labs and imaging are unremarkable.  You do not have pneumonia but most likely have a COPD exacerbation.  You do not have flu, COVID, or RSV.  You are not having a heart failure exacerbation.  I sent prescriptions of prednisone and doxycycline to your pharmacy. Take prednisone 60 mg for the next 4 days.  Take doxycycline 100 mg twice a day for the next 7 days.  Follow-up with your cardiologist for evaluation of your chest wall pain and your primary care for evaluation of your shortness of breath.  Get help right away if: You are short of breath and cannot: Talk in full sentences. Do normal activities. You have chest pain. You feel confused.

## 2023-10-03 NOTE — Care Management (Addendum)
Transition of Care [facility]) - Emergency Department Mini Assessment   Patient Details  Name: Bradley SHUBA Sr. MRN: [id number] Date of Birth: 1941-02-09  Transition of Care [facility]) CM/SW Contact:    Lavenia Atlas, RN Phone Number: 10/03/2023, 3:09 PM   Clinical Narrative: Received TOC consult for home oxygen. Per chart review prior to ED visit patient is on 4L [place]. This RNCM called patient's phone number and unsbale to reach. This RNCM spoke with patient's grand daughter who reports she does not know which DME vendor supplies patient's home O2. Notified RN.   TOC will continue to follow.  - 3:15pm Received message from bedside RN that patient reports he has worked his home oxygen concerns out, as the portable tank is now charged.   No additional TOC needs  ED Mini Assessment: What brought you to the Emergency Department? : c/o chest pain  Barriers to Discharge: Continued Medical Work up  [employer] interventions: assist with coordination of home oxygen     Interventions which prevented an admission or readmission: Other (must enter comment) (Home oxygen)    Patient Contact and Communications     Spoke with: Hospital doctor (grand daughter) Contact Date: 10/03/23,   Contact time: 1507 Contact Phone Number: [phone] Call outcome: Completed  Patient states their goals for this hospitalization and ongoing recovery are:: to feel better CMS Medicare.gov Compare Post Acute Care list provided to:: Patient Represenative (must comment) Choice offered to / list presented to : Adult Children  Admission diagnosis:  CHEST PAINS,SOB Patient Active Problem List   Diagnosis Date Noted   AKI (acute kidney injury) (HCC) 07/04/2023   Cellulitis of right leg 07/04/2023   Acute encephalopathy 07/04/2023   Viral pneumonia 05/26/2023   Acute on chronic congestive heart failure (HCC) 10/05/2022   Acute respiratory failure with hypoxemia (HCC) 08/27/2022   COVID 08/23/2022   Displaced fracture of  shaft of third metacarpal bone, left hand, sub encounter for closed fracture 08/03/2022   ILD (interstitial lung disease) (HCC) 07/30/2022   CAD (coronary artery disease) 07/19/2022   Acute on chronic respiratory failure with hypoxia (HCC) 07/18/2022   GERD without esophagitis 07/30/2021   Cor pulmonale (chronic) (HCC) 08/05/2020   COPD exacerbation (HCC) 12/22/2018   COPD,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 12/22/2018   PCP NOTES >>> 07/15/2015   Annual physical exam 05/08/2012   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   Sinoatrial node dysfunction (HCC)    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   Chronic diastolic heart failure (HCC)    PAD (peripheral artery disease) (HCC) 10/22/2010   Chronic dermatitis 09/14/2010   Chronic respiratory failure with hypoxia and hypercapnia (HCC) 09/14/2010   Chronic pulmonary hypertension secondary to elevated L H pressures  04/15/2009   ERECTILE DYSFUNCTION 01/06/2009   Mixed diabetic hyperlipidemia associated with type 2 diabetes mellitus (HCC) 12/13/2007   Essential hypertension 12/13/2007   Anxiety--insomnia 08/14/2007   Type 2 diabetes mellitus (HCC) 01/02/2007   Atrial fibrillation (HCC) 01/02/2007   Benign enlargement of prostate 01/02/2007   PCP:  [name], MD Pharmacy:   [facility] DELIVERY - [place], [place] - [address] [place] [address] Phone: [phone] Fax: [phone]  [id number] - [place], [place] - [address] AT [facility] [address] [place] [address] Phone: [phone] Fax: [phone]

## 2023-10-03 NOTE — ED Triage Notes (Signed)
Pt reports to ED with complaints of chest pain that started last night. Reports that his wife recently passed. States the pain in chest kept coming and going last night. Reports that he wears oxygen at home on 4L.

## 2023-10-03 NOTE — ED Notes (Signed)
PT states he uses 4 lpm supplemental 02 at home. PT also states that his oxygen concentrator (at home) is working appropriately. However, PT states his portable oxygen concentrator has not been charged but when charged it works fine. RN aware.

## 2023-10-03 NOTE — ED Provider Notes (Signed)
[place] [facility] [facility] Provider Note   CSN: [id number] Arrival date & time: 10/03/23  1201     History  Chief Complaint  Patient presents with   Chest Pain    Bradley [name] Sr. is a 82 y.o. male with a history of atrial fibrillation, pulmonary hypertension, COPD, and type 2 diabetes mellitus who presents the ED today for chest pain and shortness of breath.  Patient reports he was sitting on the couch yesterday when he started feeling short of breath.  He endorses increased coughing with associated chest pain when he coughs.  He denies fevers, abdominal pain, nausea, or vomiting.  Denies any sick contact.  Patient reports history of pneumonia and states that his symptoms feels similar to when the last time he had it.  He states that he was walking to get the mail the other day when it started raining and thinks that's triggered his symptoms.  Patient reports being on 4 L of oxygen at home and states that his O2 saturations are usually in the low 90s.  No additional complaints or concerns at this time.    Home Medications Prior to Admission medications   Medication Sig Start Date End Date Taking? Authorizing Provider  doxycycline (VIBRAMYCIN) 100 MG capsule Take 1 capsule (100 mg total) by mouth 2 (two) times daily for 7 days. 10/03/23 10/10/23 Yes Maxwell Marion, PA-C  predniSONE (DELTASONE) 20 MG tablet Take 3 tablets (60 mg total) by mouth daily for 4 days. 10/03/23 10/07/23 Yes Maxwell Marion, PA-C  acetaminophen (TYLENOL) 500 MG tablet Take 1,000 mg by mouth every 8 (eight) hours as needed for headache or moderate pain.    [provider]  amLODipine (NORVASC) 10 MG tablet Take 1 tablet (10 mg total) by mouth daily. 05/30/23   [name], MD  aspirin 81 MG tablet Take 81 mg by mouth every morning.     [provider]  atorvastatin (LIPITOR) 40 MG tablet Take 1 tablet (40 mg total) by mouth at bedtime. 10/21/22   Alver Sorrow, NP   bisoprolol (ZEBETA) 10 MG tablet TAKE 1 TABLET DAILY 05/09/23   [name], MD  calcium-vitamin D (OSCAL WITH D) 500-200 MG-UNIT tablet Take 1 tablet by mouth every morning.    [provider]  cholecalciferol (VITAMIN D3) 25 MCG (1000 UNIT) tablet Take 1,000 Units by mouth every evening.    [provider]  dapagliflozin propanediol (FARXIGA) 10 MG TABS tablet Take 1 tablet (10 mg total) by mouth daily before breakfast. 12/29/22   [name], MD  diphenhydramine-acetaminophen (TYLENOL PM) 25-500 MG TABS tablet Take 1 tablet by mouth at bedtime as needed (sleep).    [provider]  furosemide (LASIX) 40 MG tablet TAKE 2 TABLETS EVERY MORNING AND 1 TABLET LATE IN THE AFTERNOON 08/19/23   [name], MD  glucose blood (ONETOUCH VERIO) test strip USE TO CHECK BLOOD SUGAR NO MORE THAN TWICE A DAY 03/28/23   [name], MD  Lancets [facility] ULTRASOFT) lancets Check blood sugars no more than twice daily 03/08/17   [name], MD  Multiple Vitamins-Minerals (MULTIVITAMINS THER. W/MINERALS) TABS tablet Take 1 tablet by mouth daily. 09/14/13   [name], MD  nitroGLYCERIN (NITROSTAT) 0.4 MG SL tablet Place 1 tablet (0.4 mg total) under the tongue every 5 (five) minutes x 3 doses as needed for chest pain. 09/07/21   [name], MD  OXYGEN Inhale 4 L/min  into the lungs continuous.    [provider]  pantoprazole (PROTONIX) 40 MG tablet Take 1 tablet (40 mg total) by mouth daily before breakfast. 05/30/23   [name], MD  tamsulosin (FLOMAX) 0.4 MG CAPS capsule Take 1 capsule (0.4 mg total) by mouth daily after supper. 05/23/23   [name], MD      Allergies    Hydrocodone, Ultram [tramadol], Cialis [tadalafil], and Avelox [moxifloxacin hydrochloride]    Review of Systems   Review of Systems  Respiratory:  Positive for shortness of breath.   All other systems reviewed and are negative.   Physical Exam Updated Vital Signs BP (!) 148/71   Pulse 70    Temp (!) 97.5 F (36.4 C) (Oral)   Resp (!) 22   SpO2 96% Comment: neb is on med air Physical Exam Vitals and nursing note reviewed.  Constitutional:      Appearance: Normal appearance.  HENT:     Head: Normocephalic and atraumatic.     Mouth/Throat:     Mouth: Mucous membranes are moist.  Eyes:     Conjunctiva/sclera: Conjunctivae normal.     Pupils: Pupils are equal, round, and reactive to light.  Cardiovascular:     Rate and Rhythm: Normal rate and regular rhythm.     Pulses: Normal pulses.  Pulmonary:     Effort: Pulmonary effort is normal.     Breath sounds: Rales present.  Abdominal:     Palpations: Abdomen is soft.     Tenderness: There is no abdominal tenderness.  Skin:    General: Skin is warm and dry.     Findings: No rash.  Neurological:     General: No focal deficit present.     Mental Status: He is alert.     Sensory: No sensory deficit.     Motor: No weakness.  Psychiatric:        Mood and Affect: Mood normal.        Behavior: Behavior normal.    ED Results / Procedures / Treatments   Labs (all labs ordered are listed, but only abnormal results are displayed) Labs Reviewed  BASIC METABOLIC PANEL - Abnormal; Notable for the following components:      Result Value   Chloride 95 (*)    CO2 34 (*)    Glucose, Bld 137 (*)    All other components within normal limits  BRAIN NATRIURETIC PEPTIDE - Abnormal; Notable for the following components:   B Natriuretic Peptide 382.7 (*)    All other components within normal limits  RESP PANEL BY RT-PCR (RSV, FLU A&B, COVID)  RVPGX2  CBC  TROPONIN I (HIGH SENSITIVITY)  TROPONIN I (HIGH SENSITIVITY)    EKG EKG Interpretation Date/Time:  Monday October 03 2023 12:14:03 EST Ventricular Rate:  79 PR Interval:    QRS Duration:  137 QT Interval:  406 QTC Calculation: 466 R Axis:   254  Text Interpretation: VENTRICULAR PACED RHYTHM RBBB and LAFB No significant change since last tracing Confirmed by Elayne Snare ([id number]) on 10/03/2023 12:38:33 PM  Radiology DG Chest Port 1 View Result Date: 10/03/2023 CLINICAL DATA:  SOB, chest pain. EXAM: PORTABLE CHEST - 1 VIEW COMPARISON:  07/04/2023. FINDINGS: Cardiac silhouette is unremarkable. No pneumothorax or pleural effusion. Bibasilar prominence of the pulmonary interstitium consistent with edema, inflammation or scarring. Aorta is calcified. There are thoracic degenerative changes. Left-sided pacer. IMPRESSION: Bibasilar interstitial prominence consistent with scarring, edema or subsegmental atelectasis. Electronically Signed  By: Layla Maw M.D.   On: 10/03/2023 13:07    Procedures Procedures: not indicated.   Medications Ordered in ED Medications  lidocaine (LIDODERM) 5 % 1 patch (1 patch Transdermal Patch Applied 10/03/23 1440)  ipratropium-albuterol (DUONEB) 0.5-2.5 (3) MG/3ML nebulizer solution 3 mL (3 mLs Nebulization Given 10/03/23 1225)  ipratropium-albuterol (DUONEB) 0.5-2.5 (3) MG/3ML nebulizer solution 3 mL (3 mLs Nebulization Given 10/03/23 1447)  methylPREDNISolone sodium succinate (SOLU-MEDROL) 125 mg/2 mL injection 125 mg (125 mg Intravenous Given 10/03/23 1514)  benzonatate (TESSALON) capsule 100 mg (100 mg Oral Given 10/03/23 1541)  acetaminophen (TYLENOL) tablet 650 mg (650 mg Oral Given 10/03/23 1541)  ipratropium-albuterol (DUONEB) 0.5-2.5 (3) MG/3ML nebulizer solution 3 mL (3 mLs Nebulization Given 10/03/23 1627)  doxycycline (VIBRA-TABS) tablet 100 mg (100 mg Oral Given 10/03/23 1641)    ED Course/ Medical Decision Making/ A&P                                 Medical Decision Making Amount and/or Complexity of Data Reviewed Labs: ordered. Radiology: ordered.  Risk OTC drugs. Prescription drug management.   This patient presents to the ED for concern of shortness of breath and chest pain, this involves an extensive number of treatment options, and is a complaint that carries with it a high risk of  complications and morbidity.   Differential diagnosis includes: ACS, pneumonia, pneumothorax, bronchitis, costochondritis, muscle strain, COPD exacerbation, etc.   Comorbidities  See HPI above   Additional History  Additional history obtained from prior records.   Cardiac Monitoring / EKG  The patient was maintained on a cardiac monitor.  I personally viewed and interpreted the cardiac monitored which showed: atrial fibrillation with multiple PVCs a heart rate of 79 bpm.   Lab Tests  I ordered and personally interpreted labs.  The pertinent results include:   CMP and CBC are within normal limits - no acute AKI, electrolyte derangement, infection, or anemia Initial troponin of 12, delta troponin of 10 BNP is 382.7, which is improved from previous 1 in August Negative respiratory panel   Imaging Studies  I ordered imaging studies including CXR  I independently visualized and interpreted imaging which showed: bibasilar interstitial prominence consistent with scarring, edema, or subsegmental atelectasis. I agree with the radiologist interpretation   Problem List / ED Course / Critical Interventions / Medication Management  Increased shortness of breath since yesterday Patient is on 4L nasal cannula at home but did not have it with him in triage. His oxygen saturation was documented at 80% with respirations at 22. Patient was brought back to a room and placed on 4L [place]. His oxygen increased to 92% which is his baseline per his chart. I ordered medications including: DuoNeb for wheezing Solumedrol for COPD exacerbation Lidocaine patch and Tylenol for chest discomfort Doxycycline for COPD exacerbation Reevaluation of the patient after these medicines showed that the patient improved. Ambulated patient in the room.  His O2 saturation remained between 88 to 90%, which is his baseline.  He denied any worsening shortness of breath with ambulation. I have reviewed the patients home  medicines and have made adjustments as needed.  Consult placed to [facility] to help patient obtain portable oxygen. He initially told RT he did not have portable oxygen but then told me he did. Consult is pending. Discussed patient case with my attending, Dr. Fredderick Phenix. Patient will be sent home with Doxycycline and Prednisone to treat  him for COPD exacerbation.   Social Determinants of Health  Physical activity   Test / Admission - Considered  Discussed findings with patient. All questions were answered. He is hemodynamically stable and safe for discharge home.  Return precautions provided.        Final Clinical Impression(s) / ED Diagnoses Final diagnoses:  COPD exacerbation (HCC)  Chest wall pain    Rx / DC Orders ED Discharge Orders          Ordered    predniSONE (DELTASONE) 20 MG tablet  Daily        10/03/23 1636    doxycycline (VIBRAMYCIN) 100 MG capsule  2 times daily        10/03/23 1636              Maxwell Marion, PA-C 10/03/23 1954    Rolan Bucco, MD 10/03/23 2336

## 2023-10-04 ENCOUNTER — Telehealth: Payer: Self-pay | Admitting: *Deleted

## 2023-10-04 ENCOUNTER — Telehealth: Payer: Self-pay | Admitting: Internal Medicine

## 2023-10-04 NOTE — Transitions of Care (Post Inpatient/ED Visit) (Signed)
   10/04/2023  Name: Bradley MCQUARRIE Sr. MRN: [id number] DOB: Aug 08, 1941  Today's TOC FU Call Status: Today's TOC FU Call Status:: Unsuccessful Call (1st Attempt) Unsuccessful Call (1st Attempt) Date: 10/04/23  Attempted to reach the patient regarding the most recent Inpatient/ED visit.  Follow Up Plan: Additional outreach attempts will be made to reach the patient to complete the Transitions of Care (Post Inpatient/ED visit) call.   Signature Nancee Brownrigg, [facility]

## 2023-10-04 NOTE — Telephone Encounter (Signed)
Called pt to give him the [place] helpline # [phone]. Vm not set up.

## 2023-10-04 NOTE — Telephone Encounter (Signed)
Copied from CRM [id number]. Topic: MyChart - Other >> Oct 04, 2023  9:17 AM Bradley [name] wrote: Reason for CRM: Patient is unable to log in to my chart , is using correct name and password

## 2023-10-06 ENCOUNTER — Other Ambulatory Visit: Payer: Self-pay

## 2023-10-06 ENCOUNTER — Telehealth: Payer: Self-pay | Admitting: Emergency Medicine

## 2023-10-06 ENCOUNTER — Ambulatory Visit (INDEPENDENT_AMBULATORY_CARE_PROVIDER_SITE_OTHER): Payer: Medicare Other | Admitting: Emergency Medicine

## 2023-10-06 DIAGNOSIS — M810 Age-related osteoporosis without current pathological fracture: Secondary | ICD-10-CM | POA: Diagnosis not present

## 2023-10-06 MED ORDER — DENOSUMAB 60 MG/ML ~~LOC~~ SOSY
60.0000 mg | PREFILLED_SYRINGE | Freq: Once | SUBCUTANEOUS | Status: AC
  Administered 2023-10-06: 60 mg via SUBCUTANEOUS

## 2023-10-06 MED ORDER — DENOSUMAB 60 MG/ML ~~LOC~~ SOSY
60.0000 mg | PREFILLED_SYRINGE | Freq: Once | SUBCUTANEOUS | Status: AC
  Administered 2024-04-05: 60 mg via SUBCUTANEOUS

## 2023-10-06 NOTE — Telephone Encounter (Signed)
Prolia given today. 

## 2023-10-06 NOTE — Progress Notes (Signed)
  Prolia (med), 60 mg/ml (dose),  subcutaneous (route) was administered Right arm (location) today. Patient tolerated injection.   Patient next injection due: in 6 months, appt made Yes (June 26,2025) .

## 2023-10-06 NOTE — Telephone Encounter (Signed)
Next injection due on/after 04/05/2024 CAM placed.

## 2023-10-10 ENCOUNTER — Ambulatory Visit (INDEPENDENT_AMBULATORY_CARE_PROVIDER_SITE_OTHER): Payer: Medicare Other | Admitting: Internal Medicine

## 2023-10-10 ENCOUNTER — Encounter: Payer: Self-pay | Admitting: Internal Medicine

## 2023-10-10 ENCOUNTER — Telehealth: Payer: Self-pay

## 2023-10-10 VITALS — BP 126/66 | HR 85 | Temp 97.5°F | Resp 18 | Ht 68.75 in | Wt 173.2 lb

## 2023-10-10 DIAGNOSIS — J441 Chronic obstructive pulmonary disease with (acute) exacerbation: Secondary | ICD-10-CM | POA: Diagnosis not present

## 2023-10-10 MED ORDER — COMBIVENT RESPIMAT 20-100 MCG/ACT IN AERS: 1.0000 | INHALATION_SPRAY | Freq: Four times a day (QID) | RESPIRATORY_TRACT | Status: DC | PRN

## 2023-10-10 MED ORDER — PREDNISONE 10 MG PO TABS: ORAL_TABLET | ORAL | 0 refills | Status: DC

## 2023-10-10 MED ORDER — BUDESONIDE 0.5 MG/2ML IN SUSP: 0.5000 mg | Freq: Two times a day (BID) | RESPIRATORY_TRACT | 1 refills | Status: DC

## 2023-10-10 MED ORDER — IPRATROPIUM-ALBUTEROL 0.5-2.5 (3) MG/3ML IN SOLN: 3.0000 mL | Freq: Four times a day (QID) | RESPIRATORY_TRACT | 1 refills | Status: DC | PRN

## 2023-10-10 NOTE — Progress Notes (Signed)
Subjective:    Patient ID: Bradley Sit., male    DOB: [birth year], 82 y.o.   MRN: [id number]  DOS:  10/10/2023 Type of visit - description: ER follow-up  Patient developed increased difficulty breathing that woke him up, some chest pain, increased cough and he felt sweaty and clammy. Had a minimal amount of sputum, clear in color, no hemoptysis, sputum quickly resolved and then his cough was dry. Went to the ER with above symptoms on 10/03/2023  W/u: BNP elevated but better than 4 months ago troponin negative viral panel negative, chest x-ray with bilateral basilar interstitial prominence (scarring, edema, ATX). Creatinine normal, CBC normal.  Was treated with nebulization, IV steroids. After treatment, he improved clinically, O2 sat went back to baseline with O2 supplements. Was released home on doxycycline and oral prednisone.  Since then he feels back to baseline with no fever or chills.  No chest pain.    Wt Readings from Last 3 Encounters:  10/10/23 173 lb 4 oz (78.6 kg)  09/19/23 174 lb 3.2 oz (79 kg)  07/25/23 177 lb 8 oz (80.5 kg)     Review of Systems See above   Past Medical History:  Diagnosis Date   Abnormal CT scan, chest 2012   CT chest, several lymphadenopathies. Sees pulmonary   Anemia    intermittent   Arthritis    "?back" (09/19/2018)   Atrial fibrillation (HCC)    s/p AV node ablation & BiV PPM implantation 09/08/11 (op dictation pending)   BPH (benign prostatic hyperplasia)    Saw Dr [name] 2004, normal renal u/s   Bronchitis 08/26/2017   CAD (coronary artery disease)    CHF (congestive heart failure) (HCC)    Thought primarily to be non-systolic although EF down (EF 24-40% 12/2010, down to 35-40% 09/05/11), cath 2008 with no CAD, nuclear study 07/2011 showing Small area of reversibility in the distal ant/lat wall the left ventricle suspicious for ischemia/septal wall HK but felt to be low risk  (per D/C Summary 07/2011)   Chronic bronchitis  (HCC)    "get it ~ q yr"   Chronic diastolic heart failure (HCC)         Chronic respiratory failure with hypoxia and hypercapnia (HCC) 09/14/2010   Followed in Pulmonary clinic/ [facility]/ [facility]  Started on 02 2lpm at discharge 09/23/11   - PFT's 10/28/2011  FEV1  1.40 (51%)  with ratio 70 and no better p B2 and DLCO 53 corrects to 101    - PFT's 10/18/2014    FEV1 1.71 (59%) with ratio 68 and no sign change p B2 and DLCO 53 corrects to 85  -  HC03   07/28/20  = 33  -  HCO3   03/17/21     = 31     Heart murmur    HLD (hyperlipidemia)    HTN (hypertension)    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   Pleural effusion 2008   S/p decortication   Pneumonia 08/02/2011   Pulmonary HTN (HCC)    per cath 2008   Type II diabetes mellitus (HCC) 1999    Past Surgical History:  Procedure Laterality Date   BI-VENTRICULAR PACEMAKER INSERTION Left 09/08/2011   Procedure: BI-VENTRICULAR PACEMAKER  INSERTION (CRT-P);  Surgeon: Marinus Maw, MD;  Location: [facility] CATH LAB;  Service: Cardiovascular;  Laterality: Left;   BIV PACEMAKER GENERATOR CHANGEOUT N/A 11/10/2022   Procedure: BIV PACEMAKER GENERATOR CHANGEOUT;  Surgeon: Marinus Maw, MD;  Location: [facility] INVASIVE CV LAB;  Service: Cardiovascular;  Laterality: N/A;   CARDIAC CATHETERIZATION     "couple times; never had balloon or stent" (09/19/2018)   CATARACT EXTRACTION W/ INTRAOCULAR LENS  IMPLANT, BILATERAL Bilateral 08/2018   COLONOSCOPY  03/10/11   normal   INSERT / REPLACE / REMOVE PACEMAKER  09/08/11   pacemaker placement   LUNG DECORTICATION     PLEURAL SCARIFICATION     pneumothorax with fibrothorax  ~ 2010   RIGHT/LEFT HEART CATH AND CORONARY ANGIOGRAPHY N/A 10/05/2022   Procedure: RIGHT/LEFT HEART CATH AND CORONARY ANGIOGRAPHY;  Surgeon: Corky Crafts, MD;  Location: [facility] INVASIVE CV LAB;  Service: Cardiovascular;  Laterality: N/A;   TONSILLECTOMY     "as a kid"     Current Outpatient Medications  Medication Instructions   acetaminophen (TYLENOL) 1,000 mg, Every 8 hours PRN   amLODipine (NORVASC) 10 mg, Oral, Daily   aspirin 81 mg, Every morning   atorvastatin (LIPITOR) 40 mg, Oral, Daily at bedtime   bisoprolol (ZEBETA) 10 mg, Oral, Daily   calcium-vitamin D (OSCAL WITH D) 500-200 MG-UNIT tablet 1 tablet, Every morning   cholecalciferol (VITAMIN D3) 1,000 Units, Every evening   dapagliflozin propanediol (FARXIGA) 10 mg, Oral, Daily before breakfast   diphenhydramine-acetaminophen (TYLENOL PM) 25-500 MG TABS tablet 1 tablet, At bedtime PRN   doxycycline (VIBRAMYCIN) 100 mg, Oral, 2 times daily   furosemide (LASIX) 40 MG tablet TAKE 2 TABLETS EVERY MORNING AND 1 TABLET LATE IN THE AFTERNOON   glucose blood (ONETOUCH VERIO) test strip USE TO CHECK BLOOD SUGAR NO MORE THAN TWICE A DAY   Lancets (ONETOUCH ULTRASOFT) lancets Check blood sugars no more than twice daily   Multiple Vitamins-Minerals (MULTIVITAMINS THER. W/MINERALS) TABS tablet 1 tablet, Oral, Daily   nitroGLYCERIN (NITROSTAT) 0.4 mg, Sublingual, Every 5 min x3 PRN   OXYGEN 4 L/min, Continuous   pantoprazole (PROTONIX) 40 mg, Oral, Daily before breakfast   tamsulosin (FLOMAX) 0.4 mg, Oral, Daily after supper       Objective:   Physical Exam BP 126/66   Pulse 85   Temp (!) 97.5 F (36.4 C) (Oral)   Resp 18   Ht 5' 8.75" (1.746 m)   Wt 173 lb 4 oz (78.6 kg)   SpO2 91% Comment: 4L/min  BMI 25.77 kg/m  General:   Well developed, NAD, BMI noted. HEENT:  Normocephalic . Face symmetric, atraumatic Neck: No JVD at 45 degrees Lungs:  Rhonchi bilaterally, worse at bases, increased expiratory time and few wheezes bilaterally Normal respiratory effort, no intercostal retractions, no accessory muscle use. Heart: RRR,  no murmur.  Lower extremities: no pretibial  edema bilaterally  Skin: Not pale. Not jaundice Neurologic:  alert & oriented X3.  Speech normal, gait appropriate for age and unassisted Psych--  Cognition and judgment appear intact.  Cooperative with normal attention span and concentration.  Behavior appropriate. No anxious or depressed appearing.      Assessment     Assessment  DM HTN Hyperlipidemia GERD Anxiety- insomnia  BPH Cardiovascular:Dr Ladona Ridgel  --  Cath x 2 [id number]: no significant CAD -- CHF, non-ischemic  --Atrial fibrillation; h/o AV node ablation, has a Pacemaker (biventricular pacing) --h/o IC bleeding: Not anticoagulated --Peripheral vascular disease Pulmonary: Dr  [facility], last OV 10-18-2014, f/u prn --COPD, severe, chronic resp failure, night O2 prn, has portable O2  --Pleural effusion s/p decortication --Pulmonary hypertension  Stasis dermatitis  Osteoporosis: T-score -4.1.  Evenity none available from men.  First Prolia shot 03/14/2023.  PLAN: COPD exacerbation: Patient presented to the ER with COPD exacerbation, workup reviewed, was released with p.o. antibiotics and prednisone. He feels back to baseline however on exam he remains very congested.  He has no edema, no JVD. He has not been using any nebulizer or inhaler.  In fact they are not on his medication list. Advised patient he is not back to baseline. Recommend the following: Good compliance with oxygen Restart and stay on budesonide twice daily Restart DuoNeb nebulization for the next 2 to 3 days recommend to do it every 6 hours and then as needed only He showed me he has a Combivent but he has not been using it.  Okay to use it if necessary, see AVS. Will ask our pharmacist to check on him to increase compliance. Every instruction was reviewed with the patient orally and written for him.  He verbalized understanding. Med reconciliation: Extensive reconciliation done today, although he has nebulizers at home and they were not on his medication  list.  That has been corrected. RTC 2 weeks to be sure he is improving

## 2023-10-10 NOTE — Telephone Encounter (Signed)
Copied from CRM [id number]. Topic: General - Other >> Oct 10, 2023  4:54 PM Denese Killings wrote: Reason for CRM: Patient stated that he lost the form that was given to him by the nurse with his information/ medications on it. Patient states he needs another copy and requesting a callback.

## 2023-10-10 NOTE — Patient Instructions (Addendum)
Your lungs are still very congested.  I sent a new prescription for more prednisone  You   need to use the 2 type of liquid for your nebulizer:  ---Pulmicort (budesonide) twice daily every day.  ---DuoNeb (ipratropium albuterol) 1 nebulization every 6 hours for chest congestion or cough.  If you are not at home you can use the inhaler you show me (Combivent).  I will ask our pharmacist to assist you with these medications.  Please come back in 2 weeks for a follow-up.          Per our records you are due for your diabetic eye exam. Please contact your eye doctor to schedule an appointment. Please have them send copies of your office visit notes to [place]. Our fax number is [phone]. If you need a referral to an eye doctor please let us know.

## 2023-10-10 NOTE — Assessment & Plan Note (Signed)
COPD exacerbation: Patient presented to the ER with COPD exacerbation, workup reviewed, was released with p.o. antibiotics and prednisone. He feels back to baseline however on exam he remains very congested.  He has no edema, no JVD. He has not been using any nebulizer or inhaler.  In fact they are not on his medication list. Advised patient he is not back to baseline. Recommend the following: Good compliance with oxygen Restart and stay on budesonide twice daily Restart DuoNeb nebulization for the next 2 to 3 days recommend to do it every 6 hours and then as needed only He showed me he has a Combivent but he has not been using it.  Okay to use it if necessary, see AVS. Will ask our pharmacist to check on him to increase compliance. Every instruction was reviewed with the patient orally and written for him.  He verbalized understanding. Med reconciliation: Extensive reconciliation done today, although he has nebulizers at home and they were not on his medication list.  That has been corrected. RTC 2 weeks to be sure he is improving

## 2023-10-11 ENCOUNTER — Telehealth: Payer: Self-pay

## 2023-10-11 MED ORDER — IPRATROPIUM-ALBUTEROL 0.5-2.5 (3) MG/3ML IN SOLN: 3.0000 mL | Freq: Four times a day (QID) | RESPIRATORY_TRACT | 1 refills | Status: DC | PRN

## 2023-10-11 MED ORDER — COMBIVENT RESPIMAT 20-100 MCG/ACT IN AERS: 1.0000 | INHALATION_SPRAY | Freq: Four times a day (QID) | RESPIRATORY_TRACT | 1 refills | Status: DC | PRN

## 2023-10-11 MED ORDER — ALBUTEROL SULFATE (2.5 MG/3ML) 0.083% IN NEBU
2.5000 mg | INHALATION_SOLUTION | Freq: Four times a day (QID) | RESPIRATORY_TRACT | 1 refills | Status: DC | PRN
Start: 2023-10-11 — End: 2024-06-14

## 2023-10-11 MED ORDER — COMBIVENT RESPIMAT 20-100 MCG/ACT IN AERS: 1.0000 | INHALATION_SPRAY | Freq: Four times a day (QID) | RESPIRATORY_TRACT | Status: DC | PRN

## 2023-10-11 NOTE — Telephone Encounter (Signed)
For now, we can order a plain albuterol 1 nebulization every 6 hours.

## 2023-10-11 NOTE — Telephone Encounter (Signed)
 Copied from CRM [id number]. Topic: Clinical - Prescription Issue >> Oct 11, 2023  1:02 PM Bradley [name] PARAS wrote: Reason for CRM: Pt called in to speak to nurse regarding his DuoNeb medication that was filled today. He notes the medication is $90 and is needing a cheaper alternate medication. He requested call back, did note in the chart he has left a message already today but he reported he picked up the paperwork from the office this morning and now needs help with the ordered medication. Requested call back #[phone] (M)

## 2023-10-11 NOTE — Telephone Encounter (Signed)
Rx sent to [facility], tried calling Pt- no answer, vm not set up. Okay for E2C2 to relay to Pt that new neb prescription has been sent, hopefully will be cheaper.

## 2023-10-11 NOTE — Telephone Encounter (Signed)
Tried calling Pt to let him know that I've reprint his AVS and prescriptions, they have been placed at front desk for pick up but vm not set up.

## 2023-10-13 ENCOUNTER — Telehealth: Payer: Self-pay

## 2023-10-13 ENCOUNTER — Ambulatory Visit: Payer: Self-pay | Admitting: Internal Medicine

## 2023-10-13 NOTE — Telephone Encounter (Signed)
 Copied from CRM [id number]. Topic: Clinical - Medical Advice >> Oct 13, 2023  3:06 PM Corean SAUNDERS wrote: Reason for CRM: Patient is very distressed and needs help from a nurse to set up his pill box as he states he messed it up this morning and really needs help.

## 2023-10-13 NOTE — Progress Notes (Deleted)
  Electrophysiology Office Note:   ID:  Bradley SAUTTER Sr., DOB 09/15/1941, MRN [id number]  Primary Cardiologist: None Electrophysiologist: Danelle Birmingham, MD  {Click to update primary MD,subspecialty MD or APP then REFRESH:1}    History of Present Illness:   Bradley BURMESTER Sr. is a 83 y.o. male with h/o permanent AF, PAH, COPD, DM2, CHF, and tachy-brady s/p PPM seen today for post hospital follow up.    Seen in ED 10/03/2023 for chest pain and SOB. Associated with productive cough, pain worse with coughing. Similar to prior PNA. On O2 4L chronically. Treated with Doxycycline  ,solumedrol and duoneb for COPD exacerbation.   Since discharge from hospital the patient reports doing ***.  he denies chest pain, palpitations, dyspnea, PND, orthopnea, nausea, vomiting, dizziness, syncope, edema, weight gain, or early satiety.   Review of systems complete and found to be negative unless listed in HPI.   EP Information / Studies Reviewed:    EKG is not ordered today. EKG from 10/03/2023 reviewed which showed AF with V pacing at 79 bpm       PPM Interrogation-  reviewed in detail today,  See PACEART report.  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   Wt Readings from Last 3 Encounters:  10/10/23 173 lb 4 oz (78.6 kg)  09/19/23 174 lb 3.2 oz (79 kg)  07/25/23 177 lb 8 oz (80.5 kg)     GEN: Well nourished, well developed in no acute distress NECK: No JVD; No carotid bruits CARDIAC: {EPRHYTHM:28826}, no murmurs, rubs, gallops RESPIRATORY:  Clear to auscultation without rales, wheezing or rhonchi  ABDOMEN: Soft, non-tender, non-distended EXTREMITIES:  No edema; No deformity   ASSESSMENT AND PLAN:    Tachy-Brady syndrome s/p Medtronic PPM  Normal PPM function See Pace Art report No changes  today  Permanent AF Rates well controlled overall.   HTN Stable on current regimen   Chronic diastolic CHF NYHA *** symptoms  Volume status  {Click here to Review PMH, Prob List, Meds, Allergies, SHx, FHx  :1}   Disposition:   Follow up with {EPPROVIDERS:28135} {EPFOLLOW UP:28173}  Signed, Ozell Prentice Passey, PA-C

## 2023-10-13 NOTE — Telephone Encounter (Signed)
 Third call to patient-was able to get patient on the phone. Patient stated that he no longer needed help and had received help from a friend. No other needs at this time.

## 2023-10-13 NOTE — Telephone Encounter (Signed)
Are you able to assist?

## 2023-10-13 NOTE — Telephone Encounter (Signed)
 Call was disconnected when agent attempted to transfer in. Attempted to call pt for triage, no answer and no VM setup, attempt x1  Copied from CRM [id number]. Topic: Clinical - Pink Word Triage >> Oct 13, 2023  4:52 PM Mercedes MATSU wrote: Reason for Triage: Patient dropped medication and is insisting he speaks to a nurse to help him put the medication back in the correct day of the week case. Answer Assessment - Initial Assessment Questions 1. REASON FOR CALL or QUESTION: What is your reason for calling today? or How can I best help you? or What question do you have that I can help answer?     Pt reports to agent dropping medication out of pill containers and needing assistance to sort them 2. CALLER: Document the source of call. (e.g., laboratory, patient).     Patient  Protocols used: PCP Call - No Triage-A-AH

## 2023-10-14 ENCOUNTER — Ambulatory Visit: Payer: Medicare Other | Attending: Student | Admitting: Student

## 2023-10-14 DIAGNOSIS — I272 Pulmonary hypertension, unspecified: Secondary | ICD-10-CM

## 2023-10-14 DIAGNOSIS — I10 Essential (primary) hypertension: Secondary | ICD-10-CM

## 2023-10-14 DIAGNOSIS — I428 Other cardiomyopathies: Secondary | ICD-10-CM

## 2023-10-14 DIAGNOSIS — I5032 Chronic diastolic (congestive) heart failure: Secondary | ICD-10-CM

## 2023-10-14 DIAGNOSIS — I4891 Unspecified atrial fibrillation: Secondary | ICD-10-CM

## 2023-10-14 NOTE — Telephone Encounter (Signed)
 I have been OOO from 12/24 thru 10/13/2023. Returned to patient's phone message today 10/14/23.  Tried to call patient but unable to reach him and his VM has not been set up. He has an appointment with pharmacist for January 8th at 4pm.  I looks list per other phone message patient has had a friend assist with medications. I will review with him at upcoming appointment.

## 2023-10-14 NOTE — Telephone Encounter (Signed)
 Noted! Thank you

## 2023-10-14 NOTE — Telephone Encounter (Signed)
 See other telephone note.

## 2023-10-17 ENCOUNTER — Encounter: Payer: Self-pay | Admitting: Student

## 2023-10-19 ENCOUNTER — Ambulatory Visit (INDEPENDENT_AMBULATORY_CARE_PROVIDER_SITE_OTHER): Payer: Medicare Other | Admitting: Pharmacist

## 2023-10-19 DIAGNOSIS — I5032 Chronic diastolic (congestive) heart failure: Secondary | ICD-10-CM

## 2023-10-19 DIAGNOSIS — Z7984 Long term (current) use of oral hypoglycemic drugs: Secondary | ICD-10-CM

## 2023-10-19 DIAGNOSIS — J441 Chronic obstructive pulmonary disease with (acute) exacerbation: Secondary | ICD-10-CM

## 2023-10-19 DIAGNOSIS — E119 Type 2 diabetes mellitus without complications: Secondary | ICD-10-CM

## 2023-10-19 DIAGNOSIS — E1159 Type 2 diabetes mellitus with other circulatory complications: Secondary | ICD-10-CM

## 2023-10-19 MED ORDER — FARXIGA 10 MG PO TABS: 10.0000 mg | ORAL_TABLET | Freq: Every day | ORAL | 1 refills | Status: DC

## 2023-10-19 NOTE — Progress Notes (Signed)
 Pharmacy Note  10/19/2023 Name: Bradley AUSTAD Sr. MRN: [id number] DOB: [birth year]  Subjective: Bradley JINNY Oliva Sr. is a 83 y.o. year old male who is a primary care patient of Amon Aloysius BRAVO, MD. Engaged with patient by telephone.   Last week Bradley [name] contacted out office in distress due complex medication regimen. When I spoke with patient today he reported that he had it all under control. He reports he is using a weekly pill container / reminder system.  However it appears Farxiga  has not been filled since September 2024 - for 90 day supply. Patient just states  I have all the medications that Dr Amon and my doctors have prescribed; It does look like Bradley [name] needs and update Rx for Farxiga  sent to [facility].   COPD -  Next pulmonology appointment is scheduled for 11/09/2023 with Dr Malachy.  Current therapy: budesonide  nebs twice a day and Duonebs as needed.  O2 setting is 4L   Patient also has albuterol  inhaler to use when away from home as needed for shortness of breath and wheezing. He reports that he rarely needs to use albuterol . Reports no concerns with breathing.   Hypertension / CHF -  Current therapy: amlodipine  10mg  daily, furosemide  40mg  twice a day, Farxiga  10mg  daily and bisoprolol  10mg  daily.  Checking blood pressure at home daily. He reports blood pressure has been good but did not have any specific number written down.   BP Readings from Last 3 Encounters:  10/10/23 126/66  10/03/23 (!) 148/71  09/19/23 108/70    Hyperlipidemia:  Current therapy: atorvastatin  40mg  daily.  Dose of atorvastatin  was increased about 1 year ago. LDL decreased from 108 (10/19/2022) to 76 (11/15/2022).   Osteoporosis:  Current therapy - prolia  60mg  every 6 months. (Dr Amon initially recommended Evenity x 12 months but was denied by insurance) 10/06/2023 received Prolia - next due around 04/05/2024. Cost of Prolia  in past has been $0 Current supplementation: Calcium  + D once daily  and MVI once daily   DEXA 02/17/2023 - Forearm Radius 33% T-score of -4.1  Lumbar spine was not utilized due to advanced degenerative changes.  DualFemur Total Mean  -2.7  Left Forearm Radius 33% was -4.1  Objective: Review of patient status, including review of consultants reports, laboratory and other test data, was performed as part of comprehensive evaluation and provision of chronic care management services.   Lab Results  Component Value Date   CREATININE 0.82 10/03/2023   CREATININE 0.91 07/25/2023   CREATININE 0.49 (L) 07/11/2023    Lab Results  Component Value Date   HGBA1C 6.2 07/25/2023       Component Value Date/Time   CHOL 142 11/15/2022 0931   TRIG 76 11/15/2022 0931   HDL 54 11/15/2022 0931   CHOLHDL 2.6 11/15/2022 0931   CHOLHDL 3.1 10/07/2022 0506   VLDL 13 10/07/2022 0506   LDLCALC 73 11/15/2022 0931   LDLDIRECT 76 11/15/2022 0928   Lab Results  Component Value Date   CHOL 142 11/15/2022   CHOL 180 10/07/2022   CHOL 141 11/11/2021   Lab Results  Component Value Date   HDL 54 11/15/2022   HDL 58 10/07/2022   HDL 41.10 11/11/2021   Lab Results  Component Value Date   LDLCALC 73 11/15/2022   LDLCALC 109 (H) 10/07/2022   LDLCALC 70 11/11/2021   Lab Results  Component Value Date   TRIG 76 11/15/2022   TRIG 67 10/07/2022   TRIG 148.0  11/11/2021   Lab Results  Component Value Date   CHOLHDL 2.6 11/15/2022   CHOLHDL 3.1 10/07/2022   CHOLHDL 3 11/11/2021   Lab Results  Component Value Date   LDLDIRECT 76 11/15/2022   LDLDIRECT 108 (H) 10/19/2022     Clinical ASCVD: Yes  The ASCVD Risk score (Arnett DK, et al., 2019) failed to calculate for the following reasons:   The 2019 ASCVD risk score is only valid for ages 86 to 17   Risk score cannot be calculated because patient has a medical history suggesting prior/existing ASCVD    BP Readings from Last 3 Encounters:  10/10/23 126/66  10/03/23 (!) 148/71  09/19/23 108/70      Allergies  Allergen Reactions   Hydrocodone  Other (See Comments)    given to him in the hospital; went thru withdrawals once home; dr said not to take it again (09/27/2012) dizziness   Ultram  [Tramadol ] Other (See Comments)    given to him in the hospital; went thru withdrawals once home; dr said not to take it again (09/27/2012) dizziness   Cialis [Tadalafil] Other (See Comments)    Headache Backache  Dizziness    Avelox  [Moxifloxacin  Hydrochloride] Itching and Other (See Comments)    Headache Dizziness    Medications Reviewed Today     Reviewed by Carla Milling, RPH-CPP (Pharmacist) on 10/19/23 at 1614  Med List Status: <None>   Medication Order Taking? Sig Documenting Provider Last Dose Status Informant  acetaminophen  (TYLENOL ) 500 MG tablet [id number]  Take 1,000 mg by mouth every 8 (eight) hours as needed for headache or moderate pain. [provider]  Active Family Member, Pharmacy Records  albuterol  (PROVENTIL ) (2.5 MG/3ML) 0.083% nebulizer solution [id number] Yes Take 3 mLs (2.5 mg total) by nebulization every 6 (six) hours as needed for wheezing or shortness of breath. Amon Aloysius BRAVO, MD Taking Active   amLODipine  (NORVASC ) 10 MG tablet [id number] Yes Take 1 tablet (10 mg total) by mouth daily. Paz, Jose E, MD Taking Active Family Member, Pharmacy Records  aspirin  81 MG tablet [id number] Yes Take 81 mg by mouth every morning.  [provider] Taking Active Family Member, Pharmacy Records  atorvastatin  (LIPITOR) 40 MG tablet [id number] Yes Take 1 tablet (40 mg total) by mouth at bedtime. Vannie Reche RAMAN, NP Taking Active Family Member, Pharmacy Records           Med Note JACKOLYN APOLINAR JONELLE Stevan Nov 10, 2022  1:24 PM)    bisoprolol  (ZEBETA ) 10 MG tablet [id number] Yes TAKE 1 TABLET DAILY Paz, Jose E, MD Taking Active Family Member, Pharmacy Records  budesonide  (PULMICORT ) 0.5 MG/2ML nebulizer solution [id number] Yes Take 2 mLs (0.5 mg total) by nebulization  2 (two) times daily. Amon Aloysius BRAVO, MD Taking Active   calcium -vitamin D  (OSCAL WITH D) 500-200 MG-UNIT tablet [id number] Yes Take 1 tablet by mouth every morning. [provider] Taking Active Family Member, Pharmacy Records  cholecalciferol  (VITAMIN D3) 25 MCG (1000 UNIT) tablet [id number] Yes Take 1,000 Units by mouth every evening. [provider] Taking Active Family Member, Pharmacy Records  dapagliflozin  propanediol (FARXIGA ) 10 MG TABS tablet [id number] Yes Take 1 tablet (10 mg total) by mouth daily before breakfast. Amon Aloysius BRAVO, MD Taking Active Family Member, Pharmacy Records  denosumab  (PROLIA ) injection 60 mg [id number]   Paz, Jose E, MD  Active   diphenhydramine -acetaminophen  (TYLENOL  PM) 25-500 MG TABS tablet [id number]  Take 1 tablet by mouth at bedtime as needed (sleep). [provider]  Active Family Member, Pharmacy Records  furosemide  (LASIX ) 40 MG tablet [id number] Yes TAKE 2 TABLETS EVERY MORNING AND 1 TABLET LATE IN THE AFTERNOON Raford Riggs, MD Taking Active   glucose blood [facility] VERIO) test strip [id number]  USE TO CHECK BLOOD SUGAR NO MORE THAN TWICE A DAY Amon Aloysius BRAVO, MD  Active Family Member, Pharmacy Records  Ipratropium-Albuterol  (COMBIVENT  RESPIMAT) 20-100 MCG/ACT AERS respimat [id number] Yes Inhale 1 puff into the lungs every 6 (six) hours as needed for wheezing or shortness of breath. Paz, Jose E, MD Taking Active   ipratropium-albuterol  (DUONEB) 0.5-2.5 (3) MG/3ML SOLN [id number] Yes Take 3 mLs by nebulization every 6 (six) hours as needed (for shortness of breath). Amon Aloysius BRAVO, MD Taking Active   Lancets [facility] ULTRASOFT) lancets [id number]  Check blood sugars no more than twice daily Paz, Jose E, MD  Active Family Member, Pharmacy Records  Multiple Vitamins-Minerals (MULTIVITAMINS THER. W/MINERALS) TABS tablet [id number] Yes Take 1 tablet by mouth daily. Amon Aloysius BRAVO, MD Taking Active Family Member, Pharmacy Records  nitroGLYCERIN  (NITROSTAT )  0.4 MG SL tablet [id number]  Place 1 tablet (0.4 mg total) under the tongue every 5 (five) minutes x 3 doses as needed for chest pain. Amon Aloysius BRAVO, MD  Active Family Member, Pharmacy Records           Med Note [place], [place] D   Tue Feb 15, 2023  2:11 PM) PRN  OXYGEN [id number] Yes Inhale 4 L/min into the lungs continuous. [provider] Taking Active Family Member, Pharmacy Records  pantoprazole  (PROTONIX ) 40 MG tablet [id number] Yes Take 1 tablet (40 mg total) by mouth daily before breakfast. Amon Aloysius BRAVO, MD Taking Active Family Member, Pharmacy Records  tamsulosin  (FLOMAX ) 0.4 MG CAPS capsule [id number] Yes Take 1 capsule (0.4 mg total) by mouth daily after supper. Amon Aloysius BRAVO, MD Taking Active Family Member, Pharmacy Records            Patient Active Problem List   Diagnosis Date Noted   AKI (acute kidney injury) (HCC) 07/04/2023   Cellulitis of right leg 07/04/2023   Acute encephalopathy 07/04/2023   Viral pneumonia 05/26/2023   Acute on chronic congestive heart failure (HCC) 10/05/2022   Acute respiratory failure with hypoxemia (HCC) 08/27/2022   COVID 08/23/2022   Displaced fracture of shaft of third metacarpal bone, left hand, sub encounter for closed fracture 08/03/2022   ILD (interstitial lung disease) (HCC) 07/30/2022   CAD (coronary artery disease) 07/19/2022   Acute on chronic respiratory failure with hypoxia (HCC) 07/18/2022   GERD without esophagitis 07/30/2021   Cor pulmonale (chronic) (HCC) 08/05/2020   COPD exacerbation (HCC) 12/22/2018   COPD,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 12/22/2018   PCP NOTES >>> 07/15/2015   Annual physical exam 05/08/2012   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   Sinoatrial node dysfunction (HCC)    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   Chronic diastolic heart failure (HCC)    PAD (peripheral artery disease) (HCC) 10/22/2010   Chronic dermatitis 09/14/2010   Chronic respiratory failure with hypoxia and hypercapnia (HCC) 09/14/2010    Chronic pulmonary hypertension secondary to elevated L H pressures  04/15/2009   ERECTILE DYSFUNCTION 01/06/2009   Mixed diabetic hyperlipidemia associated with type 2 diabetes mellitus (HCC) 12/13/2007   Essential hypertension 12/13/2007   Anxiety--insomnia 08/14/2007   Type 2 diabetes mellitus (HCC) 01/02/2007   Atrial fibrillation (HCC) 01/02/2007   Benign enlargement of prostate 01/02/2007     Medication Assistance:  None required.  Patient affirms current coverage meets needs. [place] has Medicare Part D coverage and additional coverage thru [employer] retirement benefits.    Assessment / Plan:  Hypertension / CHF: goal blood pressure < 130/80 Continue bisoprolol  10mg  daily and amlodipine  10mg  daily for blood pressure.  Continue Farxiga  and furosemide  for CHF Continue to check weight daily Continue to limit intake of sodium in diet.  Check blood pressure 2 to 3 times per week and record (can record on same sheet as weight)   COPD:  Continue budesonide  and DuoNeb nebs  Reminded to use albuterol  in need when away from home and gets shortness of breath   Hyperlipidemia: goal LDL < 70 Continue to take atorvastatin  40mg  daily  Osteoporosis:  Continue Prolia  60mg  every 6 months. Next dose 04/05/2024 Continue Calcium  + D once daily and MVI once daily   Medication Management:  Reviewed med list.  Discussed using medication packaging. Patient is considering (might also depend on if cost will change if he uses [facility] versus [facility] pharmacy).  Reviewed refill history and adherence.   Meds ordered this encounter  Medications   FARXIGA  10 MG TABS tablet    Sig: Take 1 tablet (10 mg total) by mouth daily.    Dispense:  90 tablet    Refill:  1     Follow Up:  Patient will come into office 10/26/2023 to see PCP; I will also meet with him to review meds and show him sample of adherence packaging to see if he wants to try.    Madelin Ray, PharmD Clinical  Pharmacist  Primary Care  - [facility]

## 2023-10-25 ENCOUNTER — Telehealth: Payer: Self-pay | Admitting: Pharmacist

## 2023-10-25 NOTE — Telephone Encounter (Signed)
 I was supposed to see patient tomorrow 10/26/23 after he sees Dr Amon to review his meds and discuss adherence packaging. However I am going to be out of the office for a family emergency. Tried to contact patient to let him know but unable to reach him and VM has not been set up on his phone. Will forward to Dr Berdine CMA to let patient know. I will try to find another time soon to show patient adherence packaging.

## 2023-10-26 ENCOUNTER — Ambulatory Visit: Payer: Medicare Other

## 2023-10-26 ENCOUNTER — Ambulatory Visit (INDEPENDENT_AMBULATORY_CARE_PROVIDER_SITE_OTHER): Payer: Medicare Other | Admitting: Internal Medicine

## 2023-10-26 ENCOUNTER — Encounter: Payer: Self-pay | Admitting: Internal Medicine

## 2023-10-26 VITALS — BP 134/68 | HR 79 | Temp 98.0°F | Resp 20 | Ht 68.75 in | Wt 174.5 lb

## 2023-10-26 DIAGNOSIS — Z7984 Long term (current) use of oral hypoglycemic drugs: Secondary | ICD-10-CM | POA: Diagnosis not present

## 2023-10-26 DIAGNOSIS — E1159 Type 2 diabetes mellitus with other circulatory complications: Secondary | ICD-10-CM

## 2023-10-26 DIAGNOSIS — J441 Chronic obstructive pulmonary disease with (acute) exacerbation: Secondary | ICD-10-CM

## 2023-10-26 LAB — HM DIABETES EYE EXAM

## 2023-10-26 LAB — HEMOGLOBIN A1C: Hgb A1c MFr Bld: 6.7 % — ABNORMAL HIGH (ref 4.6–6.5)

## 2023-10-26 NOTE — Progress Notes (Addendum)
Subjective:    Patient ID: Bradley Sit., male    DOB: 1941-04-06, 83 y.o.   MRN: [id number]  DOS:  10/26/2023 Type of visit - description: f/u  Here for follow-up on COPD exacerbation. He continues to feel well. Denies fever, chest pain. No cough. Ambulatory BPs are WNL.  Review of Systems See above   Past Medical History:  Diagnosis Date   Abnormal CT scan, chest 2012   CT chest, several lymphadenopathies. Sees pulmonary   Anemia    intermittent   Arthritis    "?back" (09/19/2018)   Atrial fibrillation (HCC)    s/p AV node ablation & BiV PPM implantation 09/08/11 (op dictation pending)   BPH (benign prostatic hyperplasia)    Saw Dr [name] 2004, normal renal u/s   Bronchitis 08/26/2017   CAD (coronary artery disease)    CHF (congestive heart failure) (HCC)    Thought primarily to be non-systolic although EF down (EF 95-62% 12/2010, down to 35-40% 09/05/11), cath 2008 with no CAD, nuclear study 07/2011 showing Small area of reversibility in the distal ant/lat wall the left ventricle suspicious for ischemia/septal wall HK but felt to be low risk  (per D/C Summary 07/2011)   Chronic bronchitis (HCC)    "get it ~ q yr"   Chronic diastolic heart failure (HCC)         Chronic respiratory failure with hypoxia and hypercapnia (HCC) 09/14/2010   Followed in Pulmonary clinic/ [facility]/ [place]  Started on 02 2lpm at discharge 09/23/11   - PFT's 10/28/2011  FEV1  1.40 (51%)  with ratio 70 and no better p B2 and DLCO 53 corrects to 101    - PFT's 10/18/2014    FEV1 1.71 (59%) with ratio 68 and no sign change p B2 and DLCO 53 corrects to 85  -  HC03   07/28/20  = 33  -  HCO3   03/17/21     = 31     Heart murmur    HLD (hyperlipidemia)    HTN (hypertension)    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    Pleural effusion 2008   S/p decortication   Pneumonia 08/02/2011   Pulmonary HTN (HCC)    per cath 2008   Type II diabetes mellitus (HCC) 1999    Past Surgical History:  Procedure Laterality Date   BI-VENTRICULAR PACEMAKER INSERTION Left 09/08/2011   Procedure: BI-VENTRICULAR PACEMAKER INSERTION (CRT-P);  Surgeon: Marinus Maw, MD;  Location: [facility] CATH LAB;  Service: Cardiovascular;  Laterality: Left;   BIV PACEMAKER GENERATOR CHANGEOUT N/A 11/10/2022   Procedure: BIV PACEMAKER GENERATOR CHANGEOUT;  Surgeon: Marinus Maw, MD;  Location: [facility] INVASIVE CV LAB;  Service: Cardiovascular;  Laterality: N/A;   CARDIAC CATHETERIZATION     "couple times; never had balloon or stent" (09/19/2018)   CATARACT EXTRACTION W/ INTRAOCULAR LENS  IMPLANT, BILATERAL Bilateral 08/2018   COLONOSCOPY  03/10/11   normal   INSERT / REPLACE / REMOVE PACEMAKER  09/08/11   pacemaker placement   LUNG DECORTICATION     PLEURAL SCARIFICATION     pneumothorax with fibrothorax  ~ 2010   RIGHT/LEFT HEART CATH AND CORONARY ANGIOGRAPHY N/A 10/05/2022   Procedure: RIGHT/LEFT HEART CATH AND CORONARY  ANGIOGRAPHY;  Surgeon: Corky Crafts, MD;  Location: [facility] INVASIVE CV LAB;  Service: Cardiovascular;  Laterality: N/A;   TONSILLECTOMY     "as a kid"     Current Outpatient Medications  Medication Instructions   acetaminophen (TYLENOL) 1,000 mg, Every 8 hours PRN   albuterol (PROVENTIL) 2.5 mg, Nebulization, Every 6 hours PRN   amLODipine (NORVASC) 10 mg, Oral, Daily   aspirin 81 mg, Every morning   atorvastatin (LIPITOR) 40 mg, Oral, Daily at bedtime   bisoprolol (ZEBETA) 10 mg, Oral, Daily   budesonide (PULMICORT) 0.5 mg, Nebulization, 2 times daily   calcium-vitamin D (OSCAL WITH D) 500-200 MG-UNIT tablet 1 tablet, Every morning   cholecalciferol (VITAMIN D3) 1,000 Units, Every evening   diphenhydramine-acetaminophen (TYLENOL PM) 25-500 MG TABS tablet 1 tablet, At bedtime PRN   Farxiga 10 mg, Oral, Daily    furosemide (LASIX) 40 MG tablet TAKE 2 TABLETS EVERY MORNING AND 1 TABLET LATE IN THE AFTERNOON   glucose blood (ONETOUCH VERIO) test strip USE TO CHECK BLOOD SUGAR NO MORE THAN TWICE A DAY   Ipratropium-Albuterol (COMBIVENT RESPIMAT) 20-100 MCG/ACT AERS respimat 1 puff, Inhalation, Every 6 hours PRN   ipratropium-albuterol (DUONEB) 0.5-2.5 (3) MG/3ML SOLN 3 mLs, Nebulization, Every 6 hours PRN   Lancets (ONETOUCH ULTRASOFT) lancets Check blood sugars no more than twice daily   Multiple Vitamins-Minerals (MULTIVITAMINS THER. W/MINERALS) TABS tablet 1 tablet, Oral, Daily   nitroGLYCERIN (NITROSTAT) 0.4 mg, Sublingual, Every 5 min x3 PRN   OXYGEN 4 L/min, Continuous   pantoprazole (PROTONIX) 40 mg, Oral, Daily before breakfast   tamsulosin (FLOMAX) 0.4 mg, Oral, Daily after supper       Objective:   Physical Exam BP 134/68   Pulse 79   Temp 98 F (36.7 C) (Oral)   Resp 20   Ht 5' 8.75" (1.746 m)   Wt 174 lb 8 oz (79.2 kg)   SpO2 97% Comment: 4L/min  BMI 25.96 kg/m  General:   Well developed, NAD, BMI noted. HEENT:  Normocephalic . Face symmetric, atraumatic Lungs:  Decreased breath sounds, minimal few rhonchi at the base (L).  No wheezing. Heart: RRR,  no murmur.  Lower extremities: no pretibial edema bilaterally  Skin: Not pale. Not jaundice Neurologic:  alert & oriented X3.  Speech normal, gait appropriate for age and unassisted Psych--  Cognition and judgment appear intact.  Cooperative with normal attention span and concentration.  Behavior appropriate. No anxious or depressed appearing.      Assessment   Problem list DM HTN Hyperlipidemia GERD Anxiety- insomnia  BPH Cardiovascular:Dr Ladona Ridgel  --  Cath x 2 [id number]: no significant CAD -- CHF, non-ischemic  --Atrial fibrillation; h/o AV node ablation, has a Pacemaker (biventricular pacing) --h/o IC bleeding: Not anticoagulated --Peripheral vascular disease Pulmonary: Dr [name], last OV 10-18-2014, f/u  prn --COPD, severe, chronic resp failure, night O2 prn, has portable O2  --Pleural effusion s/p decortication --Pulmonary hypertension  Stasis dermatitis  Osteoporosis: T-score -4.1.  Evenity none available from men.  First Prolia shot 03/14/2023.  PLAN: COPD exacerbation: Since the last visit, reports good compliance with budesonide, has not reach out for DuoNeb because is  essentially asx. Lung exam is much improved with only residual rhonchi at the base. Saw our clinical pharmacist who encouraged good med compliance. Plan: Continue budesonide twice daily, rescue with DuoNeb or Combivent. DM:No ambulatory CBGs, on Farxiga, check A1c. HTN: On amlodipine, Lasix, bisoprolol, last BMP okay. RTC 3 months

## 2023-10-26 NOTE — Assessment & Plan Note (Signed)
 COPD exacerbation: Since the last visit, reports good compliance with budesonide , has not reach out for DuoNeb because is  essentially asx. Lung exam is much improved with only residual rhonchi at the base. Saw our clinical pharmacist who encouraged good med compliance. Plan: Continue budesonide  twice daily, rescue with DuoNeb or Combivent . DM:No ambulatory CBGs, on Farxiga , check A1c. HTN: On amlodipine , Lasix , bisoprolol , last BMP okay. RTC 3 months

## 2023-10-26 NOTE — Patient Instructions (Addendum)
 Proceed with blood work today.  Schedule a follow-up with me in 3 months.    Continue to use the 2 type of liquid for your nebulizer:   ---Pulmicort  (budesonide ) twice daily every day.   ---DuoNeb (ipratropium albuterol ) 1 nebulization every 6 hours for chest congestion or cough.   If you are not at home you can use the inhaler  (Combivent ).  Per our records you are due for your diabetic eye exam. Please contact your eye doctor to schedule an appointment. Please have them send copies of your office visit notes to us . Our fax number is [phone]. If you need a referral to an eye doctor please let us  know.

## 2023-10-27 ENCOUNTER — Encounter: Payer: Self-pay | Admitting: Internal Medicine

## 2023-11-03 ENCOUNTER — Other Ambulatory Visit (HOSPITAL_BASED_OUTPATIENT_CLINIC_OR_DEPARTMENT_OTHER): Payer: Self-pay | Admitting: Family

## 2023-11-09 ENCOUNTER — Encounter: Payer: Self-pay | Admitting: Nurse Practitioner

## 2023-11-09 ENCOUNTER — Ambulatory Visit (INDEPENDENT_AMBULATORY_CARE_PROVIDER_SITE_OTHER): Payer: Medicare Other | Admitting: Nurse Practitioner

## 2023-11-09 VITALS — BP 117/60 | HR 92 | Ht 69.0 in | Wt 173.4 lb

## 2023-11-09 DIAGNOSIS — I2781 Cor pulmonale (chronic): Secondary | ICD-10-CM

## 2023-11-09 DIAGNOSIS — J9612 Chronic respiratory failure with hypercapnia: Secondary | ICD-10-CM

## 2023-11-09 DIAGNOSIS — J449 Chronic obstructive pulmonary disease, unspecified: Secondary | ICD-10-CM

## 2023-11-09 DIAGNOSIS — J9611 Chronic respiratory failure with hypoxia: Secondary | ICD-10-CM | POA: Diagnosis not present

## 2023-11-09 NOTE — Progress Notes (Signed)
@Patient  ID: Bradley Searles Sr., male    DOB: [birth year], 83 y.o.   MRN: [id number]  Chief Complaint  Patient presents with   Follow-up    Referring provider: [name], MD  HPI: 83 year old male, former smoker followed for COPD, cor pulmonale, and chronic respiratory failure on supplemental O2. He is a patient of [name] and last seen in office on 11/04/2022. Past medical history significant for HTN, CHF, non-ischemic cardiomyopathy, sinoatrial node dysfunction s/p pacemaker, CAD, GERD, DM, HLD, anxiety.   TEST/EVENTS:  10/28/2011 PFT: FEV1 51%, ratio 70. No BD. DLCO 53, corrects to 101 10/18/2014 PFT: FEV1 59%, ratio 68, no BD. DLCO corrects to 85 07/30/2021 CTA chest: no evidence of PE. Mildly prominent mediastinal nodes, stable. Subcarinal node, stable. Mosaic attenuation with air trapping. Changes of mucous plugging in the lower lobes b/l with mild atelectatic changes.  05/26/2023 CTA chest: atherosclerosis. Lungs well aerate. Mosaic attenuation. Air trapping. Bibasilar atelectasis.  10/03/2023 CXR: bibasilar interstitial prominence consistent with scarring, edema or atelectasis   03/19/2021: OV with [name]. Recent admission from 03/05/2021-03/11/2021 for acute on chronic respiratory failure r/t CAP, AECOPD and decompensated HF. Discharged on baseline 2-3 lpm supplemental O2.   07/30/2022: OV with Azyria Osmon NP for hospital follow up. He was seen by his PCP, [name], on 10/17 and noted to be 89% on 3 lpm POC; he was advised to increase to 4 lpm until he was seen here. He was also noted to still have some wheezing on exam; decided to hold off on further prednisone. Today, he tells me that he is doing better since he was discharged home. He still feels more short winded from his baseline. He's not quite back to his normal activities and hasn't been doing as much around the house. His cough is better; occasionally productive with clear to rarely green sputum. He has been wheezing still. Denies fevers,  chills, leg swelling, hemoptysis, orthopnea, PND, night sweats, weight gain. Completes budesonide nebs twice a day. Only doing duonebs twice daily. He takes lasix 40 mg once a day. Not consistently monitoring his oxygen levels. CXR with persistent edema; advised to increase lasix to 40 mg Twice daily for 5 days then resume to 40 mg daily. Treated for possible concurrent AECOPD with prednisone taper.   08/09/2022: OV with Anique Beckley NP for follow-up.  He is feeling much better since he was here last.  He has 1 day left of steroids.  He is back on his 40 mg of Lasix.  Feels like his breathing is back to his baseline.  No significant cough or increased chest congestion.  Has not noticed any wheezing.  Denies fevers, chills, leg swelling, orthopnea. He continues on triple therapy nebs with duoneb and budesonide. Wearing supplemental oxygen 4 lpm POC. Unfortunately, weight was up around 20 lb. Oddly appeared euvolemic on exam. BMET checked. Discussed with Dr. Ladona Ridgel, cardiologist, who agreed with increased lasix dosing. Instructed him to take 40 mg in AM and 20 mg in PM. Advised him to closely monitor his weights and follow up with cardiology. Previous ground glass on imaging; plan for HRCT end of December to assess for ILD and repeat PFTs.  09/09/2022: Sudie Grumbling with Jamyah Folk NP for hospital follow up. Unfortunately, after I saw him last, he contracted COVID. He was hospitalized from 11/13-11/15 and treated for AECOPD r/t COVID infection. He was treated with steroid taper. He then represented to the ED on 11/17 with worsening shortness of breath and low  oxygen levels. He was admitted for CAP with patchy consolidation in the left lower lung and right infrahilar area. He was treated with IV antibiotics and transitioned to cefdinir and azithromycin upon discharge on 11/20 to complete 5 day course. He has been feeling well since discharged. Feels like his breathing is the best it's been in a while. No residual effects from COVID.  Working on building his stamina back up. Denies any increased cough, chest congestion, leg swelling, orthopnea, PND. Weight is down almost 40 lb since he was here last. Monitoring weights at home. He's back on lasix 40 mg daily. He continues on duonebs every 6 hours and budesonide twice daily. No increased O2 demand.   11/04/2022: OV with [name].  Doing well.  No breathing problems noted.  Wearing supplemental oxygen 4 L/min 24/7.  Cannot walk at normal pace on flat grade without shortness of breath but does find slow and steady.  Okay with shopping and mailbox.  No recent exacerbations.  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  Maintained on DuoNeb and nebulized ICS.  11/09/2023: Today-follow-up Discussed the use of AI scribe software for clinical note transcription with the patient, who gave verbal consent to proceed.  History of Present Illness   The patient, with chronic obstructive pulmonary disease, presents for follow up.   The patient had been experiencing breathing difficulties and productive cough so he went to the ED about a month ago (10/03/2023) and was treated for AECOPD with doxycycline and prednisone. CXR without superimposed infection. He then followed up with his primary care provider a week after and still was having difficulties with SOB. He was started on an additional prednisone taper. He now feels '100% better' and back to normal. He doesn't have much of a cough. When he does, he produces small amount of yellow phlegm, which is normal for him. No hemoptysis, fevers, chills, wheezing, chest congestion. Denies leg swelling or orthopnea.    Using neb treatments as prescribed. On POC with exertion 4 lpm. No low O2 levels at home.       Allergies  Allergen Reactions   Hydrocodone Other (See Comments)    "given to him in the hospital; went thru withdrawals once home; dr said not to take it again" (09/27/2012) dizziness   Ultram [Tramadol] Other (See Comments)    "given to him  in the hospital; went thru withdrawals once home; dr said not to take it again" (09/27/2012) dizziness   Cialis [Tadalafil] Other (See Comments)    Headache Backache  Dizziness    Avelox [Moxifloxacin Hydrochloride] Itching and Other (See Comments)    Headache Dizziness    Immunization History  Administered Date(s) Administered   Fluad Quad(high Dose 65+) 06/26/2019, 08/11/2020, 07/13/2022   Fluad Trivalent(High Dose 65+) 07/25/2023   Influenza Split 10/01/2011   Influenza Whole 08/14/2007, 07/11/2009, 09/14/2010   Influenza, High Dose Seasonal PF 09/14/2013, 07/15/2015, 07/09/2016, 07/05/2017, 08/03/2018   Influenza,inj,Quad PF,6+ Mos 07/05/2014   Influenza-Unspecified 08/11/2012, 08/11/2021   Janssen (J&J) SARS-COV-2 Vaccination 01/12/2020, 09/19/2020   PFIZER Comirnaty(Gray Top)Covid-19 Tri-Sucrose Vaccine 03/18/2021   PNEUMOCOCCAL CONJUGATE-20 11/15/2022   Pfizer Covid-19 Vaccine Bivalent Booster 5yrs & up 08/11/2021   Pneumococcal Conjugate-13 03/12/2015   Pneumococcal Polysaccharide-23 07/11/2004, 03/30/2009, 11/01/2017, 05/11/2019   Td 03/12/2003   Tetanus 09/14/2013   Zoster Recombinant(Shingrix) 03/11/2022, 09/10/2022   Zoster, Live 04/20/2011    Past Medical History:  Diagnosis Date   Abnormal CT scan, chest 2012   CT chest, several lymphadenopathies. Sees  pulmonary   Anemia    intermittent   Arthritis    "?back" (09/19/2018)   Atrial fibrillation (HCC)    s/p AV node ablation & BiV PPM implantation 09/08/11 (op dictation pending)   BPH (benign prostatic hyperplasia)    Saw Dr [name] 2004, normal renal u/s   Bronchitis 08/26/2017   CAD (coronary artery disease)    CHF (congestive heart failure) (HCC)    Thought primarily to be non-systolic although EF down (EF 65-78% 12/2010, down to 35-40% 09/05/11), cath 2008 with no CAD, nuclear study 07/2011 showing Small area of reversibility in the distal ant/lat wall the left ventricle suspicious for ischemia/septal  wall HK but felt to be low risk  (per D/C Summary 07/2011)   Chronic bronchitis (HCC)    "get it ~ q yr"   Chronic diastolic heart failure (HCC)         Chronic respiratory failure with hypoxia and hypercapnia (HCC) 09/14/2010   Followed in Pulmonary clinic/ [facility]/ [place]  Started on 02 2lpm at discharge 09/23/11   - PFT's 10/28/2011  FEV1  1.40 (51%)  with ratio 70 and no better p B2 and DLCO 53 corrects to 101    - PFT's 10/18/2014    FEV1 1.71 (59%) with ratio 68 and no sign change p B2 and DLCO 53 corrects to 85  -  HC03   07/28/20  = 33  -  HCO3   03/17/21     = 31     Heart murmur    HLD (hyperlipidemia)    HTN (hypertension)    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   Pleural effusion 2008   S/p decortication   Pneumonia 08/02/2011   Pulmonary HTN (HCC)    per cath 2008   Type II diabetes mellitus (HCC) 1999    Tobacco History: Social History   Tobacco Use  Smoking Status Former   Current packs/day: 0.00   Average packs/day: 2.5 packs/day for 25.0 years (62.5 ttl pk-yrs)   Types: Cigarettes, Cigars   Start date: 10/12/1955   Quit date: 10/11/1980   Years since quitting: 43.1  Smokeless Tobacco Never   Counseling given: Not Answered   Outpatient Medications Prior to Visit  Medication Sig Dispense Refill   acetaminophen (TYLENOL) 500 MG tablet Take 1,000 mg by mouth every 8 (eight) hours as needed for headache or moderate pain.     albuterol (PROVENTIL) (2.5 MG/3ML) 0.083% nebulizer solution Take 3 mLs (2.5 mg total) by nebulization every 6 (six) hours as needed for wheezing or shortness of breath. 150 mL 1   amLODipine (NORVASC) 10 MG tablet Take 1 tablet (10 mg total) by mouth daily. 90 tablet 1   aspirin 81 MG tablet Take 81 mg by mouth every morning.      atorvastatin (LIPITOR) 40  MG tablet TAKE 1 TABLET AT BEDTIME 90 tablet 3   bisoprolol (ZEBETA) 10 MG tablet TAKE 1 TABLET DAILY 90 tablet 3   budesonide (PULMICORT) 0.5 MG/2ML nebulizer solution Take 2 mLs (0.5 mg total) by nebulization 2 (two) times daily. 360 mL 1   calcium-vitamin D (OSCAL WITH D) 500-200 MG-UNIT tablet Take 1 tablet by mouth every morning.     cholecalciferol (VITAMIN D3) 25  MCG (1000 UNIT) tablet Take 1,000 Units by mouth every evening.     diphenhydramine-acetaminophen (TYLENOL PM) 25-500 MG TABS tablet Take 1 tablet by mouth at bedtime as needed (sleep).     FARXIGA 10 MG TABS tablet Take 1 tablet (10 mg total) by mouth daily. 90 tablet 1   furosemide (LASIX) 40 MG tablet TAKE 2 TABLETS EVERY MORNING AND 1 TABLET LATE IN THE AFTERNOON 270 tablet 3   glucose blood (ONETOUCH VERIO) test strip USE TO CHECK BLOOD SUGAR NO MORE THAN TWICE A DAY 200 strip 12   Ipratropium-Albuterol (COMBIVENT RESPIMAT) 20-100 MCG/ACT AERS respimat Inhale 1 puff into the lungs every 6 (six) hours as needed for wheezing or shortness of breath. 360 g 1   ipratropium-albuterol (DUONEB) 0.5-2.5 (3) MG/3ML SOLN Take 3 mLs by nebulization every 6 (six) hours as needed (for shortness of breath). 360 mL 1   Lancets (ONETOUCH ULTRASOFT) lancets Check blood sugars no more than twice daily 200 each 12   Multiple Vitamins-Minerals (MULTIVITAMINS THER. W/MINERALS) TABS tablet Take 1 tablet by mouth daily. 30 each 5   nitroGLYCERIN (NITROSTAT) 0.4 MG SL tablet Place 1 tablet (0.4 mg total) under the tongue every 5 (five) minutes x 3 doses as needed for chest pain. 25 tablet 3   OXYGEN Inhale 4 L/min into the lungs continuous.     pantoprazole (PROTONIX) 40 MG tablet Take 1 tablet (40 mg total) by mouth daily before breakfast. 90 tablet 1   tamsulosin (FLOMAX) 0.4 MG CAPS capsule Take 1 capsule (0.4 mg total) by mouth daily after supper. 90 capsule 1   Facility-Administered Medications Prior to Visit  Medication Dose Route Frequency  Provider Last Rate Last Admin   [START ON 02/04/2024] denosumab (PROLIA) injection 60 mg  60 mg Subcutaneous Once [name], MD         Review of Systems:   Constitutional: No weight gain or loss, night sweats, fevers, chills, fatigue, lassitude HEENT: No headaches, difficulty swallowing, tooth/dental problems, or sore throat. No sneezing, itching, ear ache, nasal congestion, or post nasal drip CV:  No chest pain, orthopnea, PND, swelling in lower extremities, anasarca, dizziness, palpitations, syncope Resp: +shortness of breath with exertion (baseline); occasional productive cough. No wheezing. No hemoptysis.  No chest wall deformity GI:  No heartburn, indigestion, abdominal pain, nausea, vomiting, diarrhea, change in bowel habits, loss of appetite, bloody stools.  MSK:  No joint pain or swelling.  No decreased range of motion.  No back pain. Neuro: No dizziness or lightheadedness.  Psych: No depression or anxiety. Mood stable.     Physical Exam:  BP 117/60 (BP Location: Left Arm, Patient Position: Sitting, Cuff Size: Normal)   Pulse 92   Ht 5\' 9"  (1.753 m)   Wt 173 lb 6.4 oz (78.7 kg)   SpO2 90%   BMI 25.61 kg/m   GEN: Pleasant, interactive, chronically-ill appearing; in no acute distress. HEENT:  Normocephalic and atraumatic. PERRLA. Sclera white. Nasal turbinates pink, moist and patent bilaterally. No rhinorrhea present. Oropharynx pink and moist, without exudate or edema. No lesions, ulcerations, or postnasal drip.  NECK:  Supple w/ fair ROM. No JVD present. Normal carotid impulses w/o bruits. Thyroid symmetrical with no goiter or nodules palpated. No lymphadenopathy.   CV: RRR, no m/r/g, no peripheral edema. Pulses intact, +2 bilaterally. No cyanosis, pallor or clubbing. PULMONARY:  Unlabored, regular breathing. Diminished posterior otherwise clear w/o wheezes/rales/rhonchi. No accessory muscle use.  GI: BS present and normoactive. Soft, non-tender to  palpation. No  organomegaly or masses detected.  MSK: No erythema, warmth or tenderness. Cap refil <2 sec all extrem. No deformities or joint swelling noted.  Neuro: A/Ox3. No focal deficits noted.   Skin: Warm, no lesions or rashe Psych: Normal affect and behavior. Judgement and thought content appropriate.     Lab Results:  CBC    Component Value Date/Time   WBC 9.5 10/03/2023 1229   RBC 4.62 10/03/2023 1229   HGB 14.2 10/03/2023 1229   HGB 14.6 11/15/2022 0928   HCT 42.2 10/03/2023 1229   HCT 41.8 11/15/2022 0928   PLT 279 10/03/2023 1229   PLT 207 11/15/2022 0928   MCV 91.3 10/03/2023 1229   MCV 91 11/15/2022 0928   MCH 30.7 10/03/2023 1229   MCHC 33.6 10/03/2023 1229   RDW 13.9 10/03/2023 1229   RDW 12.0 11/15/2022 0928   LYMPHSABS 1.7 07/25/2023 0900   MONOABS 0.6 07/25/2023 0900   EOSABS 0.1 07/25/2023 0900   BASOSABS 0.0 07/25/2023 0900    BMET    Component Value Date/Time   NA 138 10/03/2023 1229   NA 143 11/15/2022 0928   K 3.9 10/03/2023 1229   CL 95 (L) 10/03/2023 1229   CO2 34 (H) 10/03/2023 1229   GLUCOSE 137 (H) 10/03/2023 1229   GLUCOSE 118 (H) 10/18/2006 0000   BUN 11 10/03/2023 1229   BUN 13 11/15/2022 0928   CREATININE 0.82 10/03/2023 1229   CREATININE 0.93 07/28/2020 1026   CALCIUM 8.9 10/03/2023 1229   GFRNONAA >60 10/03/2023 1229   GFRAA >60 09/18/2019 0353    BNP    Component Value Date/Time   BNP 382.7 (H) 10/03/2023 1444     Imaging:  No results found.  denosumab (PROLIA) injection 60 mg     Date Action Dose Route User   10/06/2023 1034 Given 60 mg Subcutaneous (Right Arm) Marian Sorrow D, CMA          Latest Ref Rng & Units 09/18/2020    8:34 AM 10/18/2014    1:02 PM  PFT Results  FVC-Pre L 1.83  2.49  P  FVC-Predicted Pre % 49  63  P  FVC-Post L 1.89  2.32  P  FVC-Predicted Post % 50  58  P  Pre FEV1/FVC % % 73  68  P  Post FEV1/FCV % % 75  72  P  FEV1-Pre L 1.33  1.71  P  FEV1-Predicted Pre % 50  59  P  FEV1-Post L 1.41   1.68  P  DLCO uncorrected ml/min/mmHg 11.58  15.84  P  DLCO UNC% % 50  53  P  DLCO corrected ml/min/mmHg 11.58    DLCO COR %Predicted % 50    DLVA Predicted % 119  85  P  TLC L 4.82  5.03  P  TLC % Predicted % 72  75  P  RV % Predicted % 117  107  P    P Preliminary result    No results found for: "NITRICOXIDE"      Assessment & Plan:   COPD,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 Severe COPD with chronic respiratory failure.  Recent exacerbation.  Clinically improved.  Continue current regimen of DuoNeb and budesonide neb.  This is affordable and he feels he is well-controlled on it.  Action plan in place.  Encouraged to monitor for any worsening symptoms.  Patient Instructions  Continue ipratropium-albuterol 3 mL neb every 6 hours Continue budesonide 2 mL neb twice daily.  Brush  tongue and rinse mouth afterwards Continue supplemental oxygen 4-5 L/min for goal greater than 88 to 90%. Increase to 5 lpm with activity  Continue Mucinex 600 to 1200 mg twice daily Continue pantoprazole 1 tab daily   Glad you are feeling better! Call us if you feel like your breathing worsens again    Follow up with 3 months with [name]. If symptoms do not improve or worsen, please contact office for sooner follow up or seek emergency care.    Chronic respiratory failure with hypoxia and hypercapnia (HCC) Stable.  He was able to tolerate POC at 4 to 5 L/min with activity.  Maintain saturations greater than 8800% on this.  Advised to monitor at home.  Cor pulmonale (chronic) (HCC) Likely group 2/3.  Euvolemic on exam.  Follow-up with cardiology as scheduled.     I spent 35 minutes of dedicated to the care of this patient on the date of this encounter to include pre-visit review of records, face-to-face time with the patient discussing conditions above, post visit ordering of testing, clinical documentation with the electronic health record, making appropriate referrals as documented, and communicating necessary  findings to members of the patients care team.  Noemi Chapel, NP 11/09/2023  Pt aware and understands NP's role.

## 2023-11-09 NOTE — Assessment & Plan Note (Signed)
Likely group 2/3.  Euvolemic on exam.  Follow-up with cardiology as scheduled.

## 2023-11-09 NOTE — Patient Instructions (Signed)
Continue ipratropium-albuterol 3 mL neb every 6 hours Continue budesonide 2 mL neb twice daily.  Brush tongue and rinse mouth afterwards Continue supplemental oxygen 4-5 L/min for goal greater than 88 to 90%. Increase to 5 lpm with activity  Continue Mucinex 600 to 1200 mg twice daily Continue pantoprazole 1 tab daily   Glad you are feeling better! Call us if you feel like your breathing worsens again    Follow up with 3 months with [name]. If symptoms do not improve or worsen, please contact office for sooner follow up or seek emergency care.

## 2023-11-09 NOTE — Assessment & Plan Note (Signed)
Stable.  He was able to tolerate POC at 4 to 5 L/min with activity.  Maintain saturations greater than 8800% on this.  Advised to monitor at home.

## 2023-11-09 NOTE — Assessment & Plan Note (Signed)
Severe COPD with chronic respiratory failure.  Recent exacerbation.  Clinically improved.  Continue current regimen of DuoNeb and budesonide neb.  This is affordable and he feels he is well-controlled on it.  Action plan in place.  Encouraged to monitor for any worsening symptoms.  Patient Instructions  Continue ipratropium-albuterol 3 mL neb every 6 hours Continue budesonide 2 mL neb twice daily.  Brush tongue and rinse mouth afterwards Continue supplemental oxygen 4-5 L/min for goal greater than 88 to 90%. Increase to 5 lpm with activity  Continue Mucinex 600 to 1200 mg twice daily Continue pantoprazole 1 tab daily   Glad you are feeling better! Call us if you feel like your breathing worsens again    Follow up with 3 months with [name]. If symptoms do not improve or worsen, please contact office for sooner follow up or seek emergency care.

## 2023-11-14 ENCOUNTER — Ambulatory Visit (INDEPENDENT_AMBULATORY_CARE_PROVIDER_SITE_OTHER): Payer: Medicare Other

## 2023-11-14 DIAGNOSIS — I495 Sick sinus syndrome: Secondary | ICD-10-CM

## 2023-11-14 LAB — CUP PACEART REMOTE DEVICE CHECK
Battery Remaining Longevity: 113 mo
Battery Voltage: 3.03 V
Brady Statistic AP VP Percent: 0 %
Brady Statistic AP VS Percent: 0 %
Brady Statistic AS VP Percent: 95.9 %
Brady Statistic AS VS Percent: 4.1 %
Brady Statistic RA Percent Paced: 0 %
Brady Statistic RV Percent Paced: 95.89 %
Date Time Interrogation Session: 20250202185856
Implantable Lead Connection Status: 753985
Implantable Lead Connection Status: 753985
Implantable Lead Implant Date: 20121128
Implantable Lead Implant Date: 20121128
Implantable Lead Location: 753858
Implantable Lead Location: 753860
Implantable Lead Model: 4194
Implantable Lead Model: 5076
Implantable Pulse Generator Implant Date: 20240131
Lead Channel Impedance Value: 3363 Ohm
Lead Channel Impedance Value: 3363 Ohm
Lead Channel Impedance Value: 342 Ohm
Lead Channel Impedance Value: 399 Ohm
Lead Channel Impedance Value: 437 Ohm
Lead Channel Impedance Value: 437 Ohm
Lead Channel Impedance Value: 494 Ohm
Lead Channel Impedance Value: 589 Ohm
Lead Channel Impedance Value: 741 Ohm
Lead Channel Pacing Threshold Amplitude: 0.625 V
Lead Channel Pacing Threshold Amplitude: 1.375 V
Lead Channel Pacing Threshold Pulse Width: 0.4 ms
Lead Channel Pacing Threshold Pulse Width: 0.4 ms
Lead Channel Sensing Intrinsic Amplitude: 10.75 mV
Lead Channel Sensing Intrinsic Amplitude: 10.75 mV
Lead Channel Setting Pacing Amplitude: 2 V
Lead Channel Setting Pacing Amplitude: 2.5 V
Lead Channel Setting Pacing Pulse Width: 0.4 ms
Lead Channel Setting Pacing Pulse Width: 0.4 ms
Lead Channel Setting Sensing Sensitivity: 4 mV
Zone Setting Status: 755011

## 2023-11-15 ENCOUNTER — Ambulatory Visit: Payer: Medicare Other | Attending: Internal Medicine | Admitting: Internal Medicine

## 2023-11-15 ENCOUNTER — Encounter: Payer: Self-pay | Admitting: Internal Medicine

## 2023-11-15 ENCOUNTER — Telehealth: Payer: Self-pay | Admitting: Internal Medicine

## 2023-11-15 VITALS — BP 120/78 | HR 88 | Ht 68.0 in | Wt 174.2 lb

## 2023-11-15 DIAGNOSIS — I4821 Permanent atrial fibrillation: Secondary | ICD-10-CM

## 2023-11-15 DIAGNOSIS — Z950 Presence of cardiac pacemaker: Secondary | ICD-10-CM | POA: Diagnosis not present

## 2023-11-15 DIAGNOSIS — I5032 Chronic diastolic (congestive) heart failure: Secondary | ICD-10-CM

## 2023-11-15 LAB — CUP PACEART INCLINIC DEVICE CHECK
Battery Remaining Longevity: 113 mo
Battery Voltage: 3.02 V
Brady Statistic AP VP Percent: 0 %
Brady Statistic AP VS Percent: 0 %
Brady Statistic AS VP Percent: 96.24 %
Brady Statistic AS VS Percent: 3.76 %
Brady Statistic RA Percent Paced: 0 %
Brady Statistic RV Percent Paced: 96.24 %
Date Time Interrogation Session: 20250204085130
Implantable Lead Connection Status: 753985
Implantable Lead Connection Status: 753985
Implantable Lead Implant Date: 20121128
Implantable Lead Implant Date: 20121128
Implantable Lead Location: 753858
Implantable Lead Location: 753860
Implantable Lead Model: 4194
Implantable Lead Model: 5076
Implantable Pulse Generator Implant Date: 20240131
Lead Channel Impedance Value: 3363 Ohm
Lead Channel Impedance Value: 3363 Ohm
Lead Channel Impedance Value: 361 Ohm
Lead Channel Impedance Value: 380 Ohm
Lead Channel Impedance Value: 437 Ohm
Lead Channel Impedance Value: 456 Ohm
Lead Channel Impedance Value: 475 Ohm
Lead Channel Impedance Value: 570 Ohm
Lead Channel Impedance Value: 741 Ohm
Lead Channel Pacing Threshold Amplitude: 0.75 V
Lead Channel Pacing Threshold Amplitude: 1.5 V
Lead Channel Pacing Threshold Pulse Width: 0.4 ms
Lead Channel Pacing Threshold Pulse Width: 0.4 ms
Lead Channel Sensing Intrinsic Amplitude: 10.75 mV
Lead Channel Sensing Intrinsic Amplitude: 4.75 mV
Lead Channel Setting Pacing Amplitude: 2 V
Lead Channel Setting Pacing Amplitude: 2.5 V
Lead Channel Setting Pacing Pulse Width: 0.4 ms
Lead Channel Setting Pacing Pulse Width: 0.4 ms
Lead Channel Setting Sensing Sensitivity: 4 mV
Zone Setting Status: 755011

## 2023-11-15 NOTE — Telephone Encounter (Signed)
Pt was just in office and forgot to ask about handicap placard. Requesting cb

## 2023-11-15 NOTE — Patient Instructions (Addendum)
 Medication Instructions:  Your physician recommends that you continue on your current medications as directed. Please refer to the Current Medication list given to you today.  *If you need a refill on your cardiac medications before your next appointment, please call your pharmacy*  Lab Work: None ordered.  If you have labs (blood work) drawn today and your tests are completely normal, you will receive your results only by: MyChart Message (if you have MyChart) OR A paper copy in the mail If you have any lab test that is abnormal or we need to change your treatment, we will call you to review the results.  Testing/Procedures: None ordered.  Follow-Up: At [facility], you and your health needs are our priority.  As part of our continuing mission to provide you with exceptional heart care, we have created designated Provider Care Teams.  These Care Teams include your primary Cardiologist (physician) and Advanced Practice Providers (APPs -  Physician Assistants and Nurse Practitioners) who all work together to provide you with the care you need, when you need it.   Your next appointment:   1 year(s)  The format for your next appointment:   In Person  Provider:   Dr. Virl Son one of the following Advanced Practice Providers on your designated Care Team:   Francis Dowse, [place] Casimiro Needle "Mardelle Matte" [place], [place] Earnest Rosier, NP  Remote monitoring is used to monitor your Pacemaker/ ICD from home. This monitoring reduces the number of office visits required to check your device to one time per year. It allows [place] to keep an eye on the functioning of your device to ensure it is working properly.   Important Information About Sugar

## 2023-11-15 NOTE — Telephone Encounter (Signed)
Pt came in with form and it was given back to front desk after being signed

## 2023-11-15 NOTE — Progress Notes (Signed)
 HPI Bradley [name] returns today for ongoing evaluation. He is a pleasant 83 yo man with peristent and uncontrolled atrial fib and LV dysfunction who underwent PPM insertion and AV node ablation 12 years ago. He has done well in the interim except for some COPD. He denies palpitations, sob or chest pressure. No edema. He had no trouble healing after his PM gen change out. He remains oxygen dependent on 4 liters. He has been saddened by the loss of his wife.  Allergies  Allergen Reactions   Hydrocodone  Other (See Comments)    given to him in the hospital; went thru withdrawals once home; dr said not to take it again (09/27/2012) dizziness   Ultram  [Tramadol ] Other (See Comments)    given to him in the hospital; went thru withdrawals once home; dr said not to take it again (09/27/2012) dizziness   Cialis [Tadalafil] Other (See Comments)    Headache Backache  Dizziness    Avelox  [Moxifloxacin  Hydrochloride] Itching and Other (See Comments)    Headache Dizziness     Current Outpatient Medications  Medication Sig Dispense Refill   acetaminophen  (TYLENOL ) 500 MG tablet Take 1,000 mg by mouth every 8 (eight) hours as needed for headache or moderate pain.     albuterol  (PROVENTIL ) (2.5 MG/3ML) 0.083% nebulizer solution Take 3 mLs (2.5 mg total) by nebulization every 6 (six) hours as needed for wheezing or shortness of breath. 150 mL 1   amLODipine  (NORVASC ) 10 MG tablet Take 1 tablet (10 mg total) by mouth daily. 90 tablet 1   aspirin  81 MG tablet Take 81 mg by mouth every morning.      atorvastatin  (LIPITOR) 40 MG tablet TAKE 1 TABLET AT BEDTIME 90 tablet 3   bisoprolol  (ZEBETA ) 10 MG tablet TAKE 1 TABLET DAILY 90 tablet 3   budesonide  (PULMICORT ) 0.5 MG/2ML nebulizer solution Take 2 mLs (0.5 mg total) by nebulization 2 (two) times daily. 360 mL 1   calcium -vitamin D  (OSCAL WITH D) 500-200 MG-UNIT tablet Take 1 tablet by mouth every morning.     cholecalciferol  (VITAMIN D3) 25 MCG  (1000 UNIT) tablet Take 1,000 Units by mouth every evening.     diphenhydramine -acetaminophen  (TYLENOL  PM) 25-500 MG TABS tablet Take 1 tablet by mouth at bedtime as needed (sleep).     FARXIGA  10 MG TABS tablet Take 1 tablet (10 mg total) by mouth daily. 90 tablet 1   furosemide  (LASIX ) 40 MG tablet TAKE 2 TABLETS EVERY MORNING AND 1 TABLET LATE IN THE AFTERNOON 270 tablet 3   glucose blood (ONETOUCH VERIO) test strip USE TO CHECK BLOOD SUGAR NO MORE THAN TWICE A DAY 200 strip 12   Ipratropium-Albuterol  (COMBIVENT  RESPIMAT) 20-100 MCG/ACT AERS respimat Inhale 1 puff into the lungs every 6 (six) hours as needed for wheezing or shortness of breath. 360 g 1   ipratropium-albuterol  (DUONEB) 0.5-2.5 (3) MG/3ML SOLN Take 3 mLs by nebulization every 6 (six) hours as needed (for shortness of breath). 360 mL 1   Lancets (ONETOUCH ULTRASOFT) lancets Check blood sugars no more than twice daily 200 each 12   Multiple Vitamins-Minerals (MULTIVITAMINS THER. W/MINERALS) TABS tablet Take 1 tablet by mouth daily. 30 each 5   nitroGLYCERIN  (NITROSTAT ) 0.4 MG SL tablet Place 1 tablet (0.4 mg total) under the tongue every 5 (five) minutes x 3 doses as needed for chest pain. 25 tablet 3   OXYGEN Inhale 4 L/min into the lungs continuous.     pantoprazole  (PROTONIX ) 40  MG tablet Take 1 tablet (40 mg total) by mouth daily before breakfast. 90 tablet 1   tamsulosin  (FLOMAX ) 0.4 MG CAPS capsule Take 1 capsule (0.4 mg total) by mouth daily after supper. 90 capsule 1   Current Facility-Administered Medications  Medication Dose Route Frequency Provider Last Rate Last Admin   [START ON 02/04/2024] denosumab  (PROLIA ) injection 60 mg  60 mg Subcutaneous Once Paz, Jose E, MD         Past Medical History:  Diagnosis Date   Abnormal CT scan, chest 2012   CT chest, several lymphadenopathies. Sees pulmonary   Anemia    intermittent   Arthritis    ?back (09/19/2018)   Atrial fibrillation (HCC)    s/p AV node ablation & BiV  PPM implantation 09/08/11 (op dictation pending)   BPH (benign prostatic hyperplasia)    Saw Dr Amiel 2004, normal renal u/s   Bronchitis 08/26/2017   CAD (coronary artery disease)    CHF (congestive heart failure) (HCC)    Thought primarily to be non-systolic although EF down (EF 54-49% 12/2010, down to 35-40% 09/05/11), cath 2008 with no CAD, nuclear study 07/2011 showing Small area of reversibility in the distal ant/lat wall the left ventricle suspicious for ischemia/septal wall HK but felt to be low risk  (per D/C Summary 07/2011)   Chronic bronchitis (HCC)    get it ~ q yr   Chronic diastolic heart failure (HCC)         Chronic respiratory failure with hypoxia and hypercapnia (HCC) 09/14/2010   Followed in Pulmonary clinic/ [facility]/ [facility]  Started on 02 2lpm at discharge 09/23/11   - PFT's 10/28/2011  FEV1  1.40 (51%)  with ratio 70 and no better p B2 and DLCO 53 corrects to 101    - PFT's 10/18/2014    FEV1 1.71 (59%) with ratio 68 and no sign change p B2 and DLCO 53 corrects to 85  -  HC03   07/28/20  = 33  -  HCO3   03/17/21     = 31     Heart murmur    HLD (hyperlipidemia)    HTN (hypertension)    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   Pleural effusion 2008   S/p decortication   Pneumonia 08/02/2011   Pulmonary HTN (HCC)    per cath 2008   Type II diabetes mellitus (HCC) 1999    ROS:   All systems reviewed and negative except as noted in the HPI.   Past Surgical History:  Procedure Laterality Date   BI-VENTRICULAR PACEMAKER INSERTION Left 09/08/2011   Procedure: BI-VENTRICULAR PACEMAKER INSERTION (CRT-P);  Surgeon: Danelle LELON Birmingham, MD;  Location: [facility] CATH LAB;  Service: Cardiovascular;  Laterality: Left;   BIV PACEMAKER GENERATOR CHANGEOUT N/A 11/10/2022   Procedure: BIV PACEMAKER  GENERATOR CHANGEOUT;  Surgeon: Birmingham Danelle LELON, MD;  Location: [facility] INVASIVE CV LAB;  Service: Cardiovascular;  Laterality: N/A;   CARDIAC CATHETERIZATION     couple times; never had balloon or stent (09/19/2018)   CATARACT EXTRACTION W/ INTRAOCULAR LENS  IMPLANT, BILATERAL Bilateral 08/2018   COLONOSCOPY  03/10/11   normal   INSERT / REPLACE / REMOVE PACEMAKER  09/08/11   pacemaker placement   LUNG DECORTICATION  PLEURAL SCARIFICATION     pneumothorax with fibrothorax  ~ 2009-10-10   RIGHT/LEFT HEART CATH AND CORONARY ANGIOGRAPHY N/A 10/05/2022   Procedure: RIGHT/LEFT HEART CATH AND CORONARY ANGIOGRAPHY;  Surgeon: Dann Candyce RAMAN, MD;  Location[facility] INVASIVE CV LAB;  Service: Cardiovascular;  Laterality: N/A;   TONSILLECTOMY     as a kid      Family History  Problem Relation Age of Onset   Diabetes Father    Coronary artery disease Father    Polycythemia Mother    Drug abuse Son    Breast cancer Maternal Aunt    Prostate cancer Neg Hx    Colon cancer Neg Hx      Social History   Socioeconomic History   Marital status: Widowed    Spouse name: Virginia  Ellers   Number of children: 1   Years of education: Not on file   Highest education level: Not on file  Occupational History   Occupation: retired Publishing Rights Manager: RETIRED  Tobacco Use   Smoking status: Former    Current packs/day: 0.00    Average packs/day: 2.5 packs/day for 25.0 years (62.5 ttl pk-yrs)    Types: Cigarettes, Cigars    Start date: 10/12/1955    Quit date: 10/11/1980    Years since quitting: 43.1   Smokeless tobacco: Never  Vaping Use   Vaping status: Never Used  Substance and Sexual Activity   Alcohol use: Not Currently    Comment: 1 can of beer ever 4-5 months   Drug use: No   Sexual activity: Not Currently  Other Topics Concern   Not on file  Social History Narrative   Moved from [place]..   Wife, Virginia  passed away 08/11/23   1 son, substance abuse, passed away Oct 10, 2016          Social Drivers of Health   Financial Resource Strain: Low Risk  (04/08/2023)   Overall Financial Resource Strain (CARDIA)    Difficulty of Paying Living Expenses: Not hard at all  Food Insecurity: No Food Insecurity (07/05/2023)   Hunger Vital Sign    Worried About Running Out of Food in the Last Year: Never true    Ran Out of Food in the Last Year: Never true  Transportation Needs: No Transportation Needs (07/05/2023)   PRAPARE - Administrator, Civil Service (Medical): No    Lack of Transportation (Non-Medical): No  Physical Activity: Insufficiently Active (04/08/2023)   Exercise Vital Sign    Days of Exercise per Week: 1 day    Minutes of Exercise per Session: 20 min  Stress: No Stress Concern Present (04/08/2023)   [employer] of Occupational Health - Occupational Stress Questionnaire    Feeling of Stress : Not at all  Social Connections: Moderately Integrated (04/08/2023)   Social Connection and Isolation Panel [NHANES]    Frequency of Communication with Friends and Family: Twice a week    Frequency of Social Gatherings with Friends and Family: Twice a week    Attends Religious Services: More than 4 times per year    Active Member of [employer] or Organizations: No    Attends Banker Meetings: Never    Marital Status: Married  Catering Manager Violence: Not At Risk (07/05/2023)   Humiliation, Afraid, Rape, and Kick questionnaire    Fear of Current or Ex-Partner: No    Emotionally Abused: No    Physically Abused: No    Sexually Abused: No  BP 120/78   Pulse 88   Ht 5' 8 (1.727 m)   Wt 174 lb 3.2 oz (79 kg)   SpO2 92%   BMI 26.49 kg/m   Physical Exam:  Chronically ill appearing NAD HEENT: Unremarkable, except wearing N/C.  Neck:  No JVD, no thyromegally Lymphatics:  No adenopathy Back:  No CVA tenderness Lungs:  Clear with no wheezes HEART:  Regular rate rhythm, no murmurs, no rubs, no clicks Abd:  soft, positive bowel sounds, no  organomegally, no rebound, no guarding Ext:  2 plus pulses, no edema, no cyanosis, no clubbing Skin:  No rashes no nodules Neuro:  CN II through XII intact, motor grossly intact   DEVICE  Normal device function.  See PaceArt for details.   Assess/Plan:   Atrial fib - his rates are well controlled, s/p AV node ablation.  2. BIV PPM - his medtronic biv PPM is working normally. Thresholds are good. 3. HTN - his bp is well controlled. He will maintain a low sodium diet. 4. Chronic diastolic heart failure - his symptoms are class 2. He will continue his current meds.   Danelle Oluwatomiwa Kinyon,MD

## 2023-11-17 ENCOUNTER — Other Ambulatory Visit: Payer: Self-pay | Admitting: Internal Medicine

## 2023-11-22 IMAGING — DX DG CHEST 2V
2 series · 2 of 2 positions shown · non-contrast
Comparison: 09/15/2021

CLINICAL DATA: COPD

EXAM:
CHEST - 2 VIEW

[chest pa]
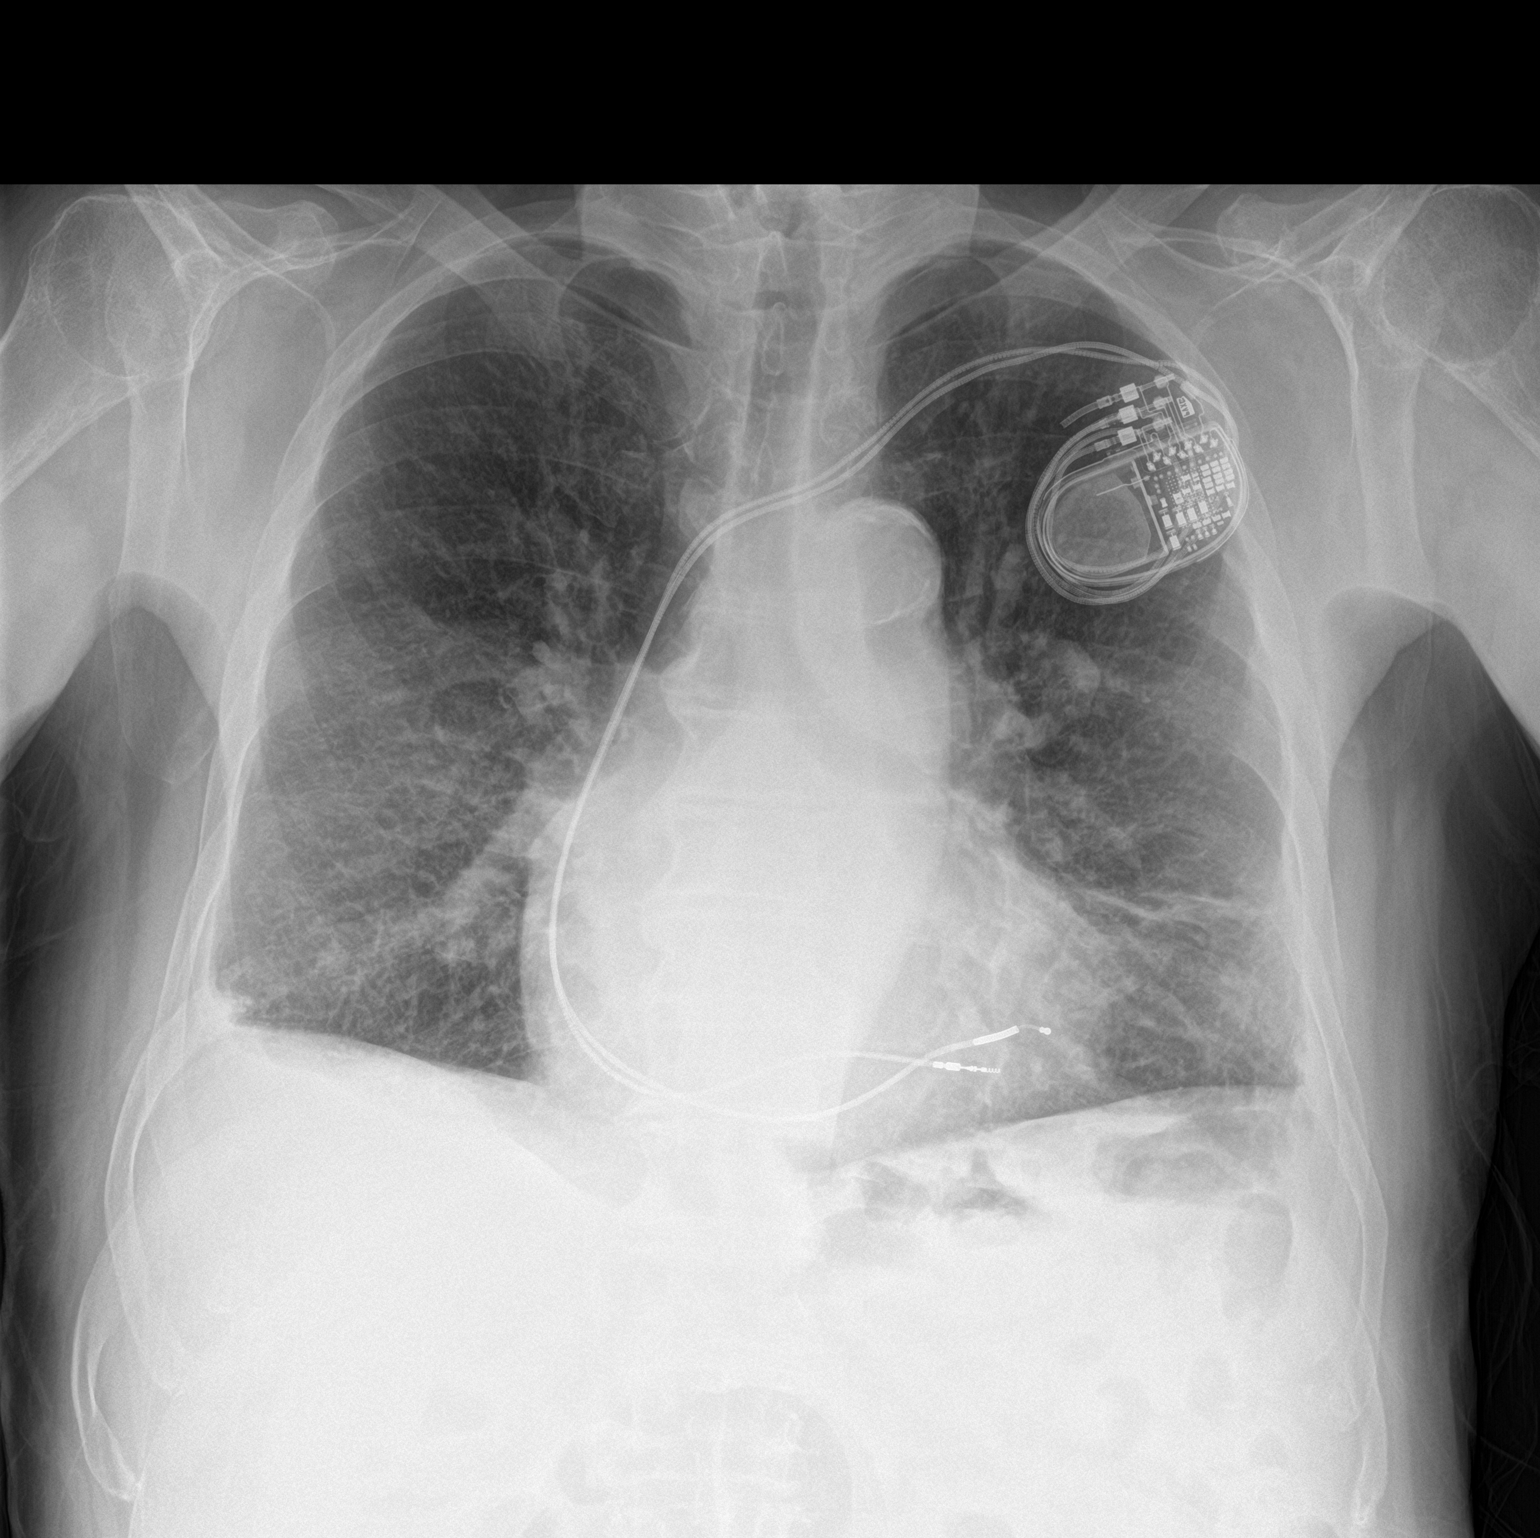

[chest lat]
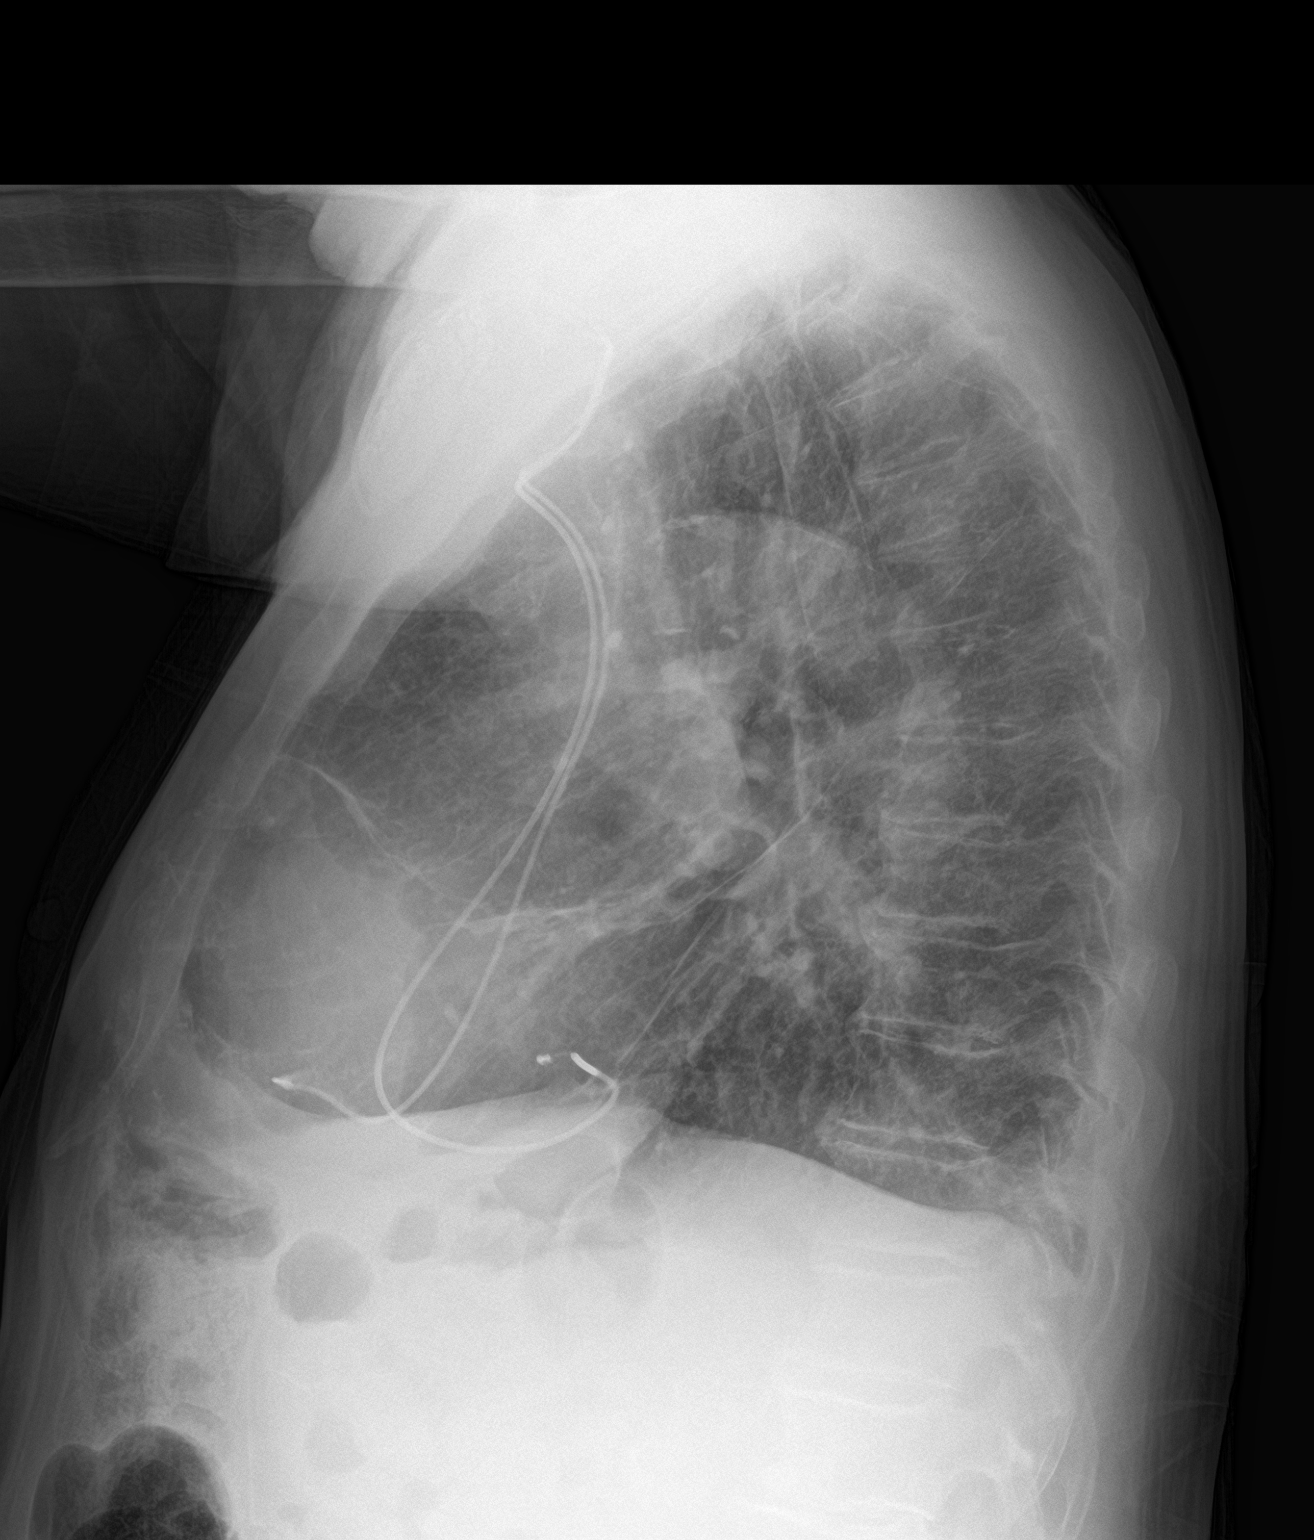

[2 of 2 positions shown; findings below may reference images not displayed]

FINDINGS: Check shadow is mildly prominent but stable. Pacing device is again
noted and stable. Linear scarring is noted in the left base. Chronic
blunting of the costophrenic angles is seen. No acute infiltrate or
effusion is noted. No bony abnormality is seen.
IMPRESSION: No acute abnormality noted. Mild scarring is noted in the left base
although no residual infiltrate is seen.

## 2023-11-25 ENCOUNTER — Other Ambulatory Visit: Payer: Self-pay | Admitting: Internal Medicine

## 2023-12-19 ENCOUNTER — Telehealth: Payer: Self-pay | Admitting: Pharmacy Technician

## 2023-12-19 ENCOUNTER — Other Ambulatory Visit (HOSPITAL_COMMUNITY): Payer: Self-pay

## 2023-12-19 NOTE — Telephone Encounter (Signed)
 Pharmacy Patient Advocate Encounter   Received notification from CoverMyMeds that prior authorization for Farxiga 10mg  is required/requested.   Insurance verification completed.   The patient is insured through [employer] .   Per test claim: Refill too soon. PA is not needed at this time. Medication was filled 12/06/23. Next eligible fill date is 02/03/24.

## 2023-12-20 NOTE — Progress Notes (Signed)
 Remote pacemaker transmission.

## 2024-01-25 ENCOUNTER — Encounter: Payer: Self-pay | Admitting: Internal Medicine

## 2024-01-25 ENCOUNTER — Ambulatory Visit (INDEPENDENT_AMBULATORY_CARE_PROVIDER_SITE_OTHER): Payer: Medicare Other | Admitting: Internal Medicine

## 2024-01-25 VITALS — BP 126/60 | HR 82 | Temp 98.1°F | Resp 20 | Ht 68.0 in | Wt 170.0 lb

## 2024-01-25 DIAGNOSIS — I10 Essential (primary) hypertension: Secondary | ICD-10-CM

## 2024-01-25 DIAGNOSIS — E1169 Type 2 diabetes mellitus with other specified complication: Secondary | ICD-10-CM | POA: Diagnosis not present

## 2024-01-25 DIAGNOSIS — J449 Chronic obstructive pulmonary disease, unspecified: Secondary | ICD-10-CM | POA: Diagnosis not present

## 2024-01-25 DIAGNOSIS — E1159 Type 2 diabetes mellitus with other circulatory complications: Secondary | ICD-10-CM | POA: Diagnosis not present

## 2024-01-25 DIAGNOSIS — E782 Mixed hyperlipidemia: Secondary | ICD-10-CM | POA: Diagnosis not present

## 2024-01-25 DIAGNOSIS — Z7984 Long term (current) use of oral hypoglycemic drugs: Secondary | ICD-10-CM

## 2024-01-25 LAB — COMPREHENSIVE METABOLIC PANEL WITH GFR
ALT: 9 U/L (ref 0–53)
AST: 16 U/L (ref 0–37)
Albumin: 4.3 g/dL (ref 3.5–5.2)
Alkaline Phosphatase: 65 U/L (ref 39–117)
BUN: 16 mg/dL (ref 6–23)
CO2: 35 meq/L — ABNORMAL HIGH (ref 19–32)
Calcium: 9.4 mg/dL (ref 8.4–10.5)
Chloride: 98 meq/L (ref 96–112)
Creatinine, Ser: 0.84 mg/dL (ref 0.40–1.50)
GFR: 80.96 mL/min (ref >60.00)
Glucose, Bld: 98 mg/dL (ref 70–99)
Potassium: 4.5 meq/L (ref 3.5–5.1)
Sodium: 141 meq/L (ref 135–145)
Total Bilirubin: 0.7 mg/dL (ref 0.2–1.2)
Total Protein: 6.9 g/dL (ref 6.0–8.3)

## 2024-01-25 LAB — MICROALBUMIN / CREATININE URINE RATIO
Creatinine,U: 29.3 mg/dL
Microalb Creat Ratio: UNDETERMINED mg/g (ref 0.0–30.0)
Microalb, Ur: <0.7 mg/dL

## 2024-01-25 LAB — LIPID PANEL
Cholesterol: 145 mg/dL (ref 0–200)
HDL: 53.9 mg/dL (ref >39.00)
LDL Cholesterol: 76 mg/dL (ref 0–99)
NonHDL: 90.82
Total CHOL/HDL Ratio: 3
Triglycerides: 76 mg/dL (ref 0.0–149.0)
VLDL: 15.2 mg/dL (ref 0.0–40.0)

## 2024-01-25 LAB — HEMOGLOBIN A1C: Hgb A1c MFr Bld: 6.1 % (ref 4.6–6.5)

## 2024-01-25 NOTE — Progress Notes (Signed)
 Subjective:    Patient ID: Halina Lever., male    DOB: October 25, 1940, 83 y.o.   MRN: [id number]  DOS:  01/25/2024 Type of visit - description: Routine office visit  Chronic medical problems addressed. Saw pulmonary and cardiology, notes reviewed. On Farxiga , denies any genital rash. History of BPH, currently with no symptoms. SOB at baseline. No LE edema.   Review of Systems See above   Past Medical History:  Diagnosis Date   Abnormal CT scan, chest 2012   CT chest, several lymphadenopathies. Sees pulmonary   Anemia    intermittent   Arthritis    "?back" (09/19/2018)   Atrial fibrillation (HCC)    s/p AV node ablation & BiV PPM implantation 09/08/11 (op dictation pending)   BPH (benign prostatic hyperplasia)    Saw Dr Theotis Flake 2004, normal renal u/s   Bronchitis 08/26/2017   CAD (coronary artery disease)    CHF (congestive heart failure) (HCC)    Thought primarily to be non-systolic although EF down (EF 40-98% 12/2010, down to 35-40% 09/05/11), cath 2008 with no CAD, nuclear study 07/2011 showing Small area of reversibility in the distal ant/lat wall the left ventricle suspicious for ischemia/septal wall HK but felt to be low risk  (per D/C Summary 07/2011)   Chronic bronchitis (HCC)    "get it ~ q yr"   Chronic diastolic heart failure (HCC)         Chronic respiratory failure with hypoxia and hypercapnia (HCC) 09/14/2010   Followed in Pulmonary clinic/ [facility]/ [facility]  Started on 02 2lpm at discharge 09/23/11   - PFT's 10/28/2011  FEV1  1.40 (51%)  with ratio 70 and no better p B2 and DLCO 53 corrects to 101    - PFT's 10/18/2014    FEV1 1.71 (59%) with ratio 68 and no sign change p B2 and DLCO 53 corrects to 85  -  HC03   07/28/20  = 33  -  HCO3   03/17/21     = 31     Heart murmur    HLD (hyperlipidemia)    HTN (hypertension)    ICB (intracranial bleed) (HCC) 06/2012   d/c coumadin  permanently   Insomnia    Migraines    "very very rare"   On home oxygen therapy     "2L; 24/7" (09/19/2018)   Pacemaker    Peripheral vascular disease (HCC)    ??   Pleural effusion 2008   S/p decortication   Pneumonia 08/02/2011   Pulmonary HTN (HCC)    per cath 2008   Type II diabetes mellitus (HCC) 1999    Past Surgical History:  Procedure Laterality Date   BI-VENTRICULAR PACEMAKER INSERTION Left 09/08/2011   Procedure: BI-VENTRICULAR PACEMAKER INSERTION (CRT-P);  Surgeon: Tammie Fall, MD;  Location: [facility] CATH LAB;  Service: Cardiovascular;  Laterality: Left;   BIV PACEMAKER GENERATOR CHANGEOUT N/A 11/10/2022   Procedure: BIV PACEMAKER GENERATOR CHANGEOUT;  Surgeon: Tammie Fall, MD;  Location: [facility] INVASIVE CV LAB;  Service: Cardiovascular;  Laterality: N/A;   CARDIAC CATHETERIZATION     "couple times; never had balloon or stent" (09/19/2018)   CATARACT EXTRACTION W/ INTRAOCULAR LENS  IMPLANT, BILATERAL Bilateral 08/2018   COLONOSCOPY  03/10/11   normal   INSERT / REPLACE / REMOVE PACEMAKER  09/08/11   pacemaker placement   LUNG DECORTICATION     PLEURAL SCARIFICATION     pneumothorax with fibrothorax  ~ 2010   RIGHT/LEFT HEART CATH AND CORONARY  ANGIOGRAPHY N/A 10/05/2022   Procedure: RIGHT/LEFT HEART CATH AND CORONARY ANGIOGRAPHY;  Surgeon: Lucendia Rusk, MD;  Location: [facility] INVASIVE CV LAB;  Service: Cardiovascular;  Laterality: N/A;   TONSILLECTOMY     "as a kid"     Current Outpatient Medications  Medication Instructions   acetaminophen  (TYLENOL ) 1,000 mg, Every 8 hours PRN   albuterol  (PROVENTIL ) 2.5 mg, Nebulization, Every 6 hours PRN   amLODipine  (NORVASC ) 10 mg, Oral, Daily   aspirin  81 mg, Every morning   atorvastatin  (LIPITOR) 40 mg, Oral, Daily at bedtime   bisoprolol  (ZEBETA ) 10 mg, Oral, Daily   budesonide  (PULMICORT ) 0.5 mg, Nebulization, 2 times daily   calcium -vitamin D  (OSCAL WITH D) 500-200 MG-UNIT tablet 1 tablet, Every morning   cholecalciferol  (VITAMIN D3) 1,000 Units, Every evening   diphenhydramine -acetaminophen   (TYLENOL  PM) 25-500 MG TABS tablet 1 tablet, At bedtime PRN   Farxiga  10 mg, Oral, Daily   furosemide  (LASIX ) 40 MG tablet TAKE 2 TABLETS EVERY MORNING AND 1 TABLET LATE IN THE AFTERNOON   glucose blood (ONETOUCH VERIO) test strip USE TO CHECK BLOOD SUGAR NO MORE THAN TWICE A DAY   Ipratropium-Albuterol  (COMBIVENT  RESPIMAT) 20-100 MCG/ACT AERS respimat 1 puff, Inhalation, Every 6 hours PRN   ipratropium-albuterol  (DUONEB) 0.5-2.5 (3) MG/3ML SOLN 3 mLs, Nebulization, Every 6 hours PRN   Lancets (ONETOUCH ULTRASOFT) lancets Check blood sugars no more than twice daily   Multiple Vitamins-Minerals (MULTIVITAMINS THER. W/MINERALS) TABS tablet 1 tablet, Oral, Daily   nitroGLYCERIN  (NITROSTAT ) 0.4 mg, Sublingual, Every 5 min x3 PRN   OXYGEN 4 L/min, Continuous   pantoprazole  (PROTONIX ) 40 mg, Oral, Daily before breakfast   tamsulosin  (FLOMAX ) 0.4 mg, Oral, Daily after supper       Objective:   Physical Exam BP 126/60   Pulse 82   Temp 98.1 F (36.7 C) (Oral)   Resp 20   Ht 5\' 8"  (1.727 m)   Wt 170 lb (77.1 kg)   SpO2 (!) 88% Comment: 5L/min  BMI 25.85 kg/m  General:   Well developed, chronically ill-appearing, nontoxic.  On oxygen. HEENT:  Normocephalic . Face symmetric, atraumatic Lungs:  Decreased breath sounds, few rhonchi mostly at bases. Normal respiratory effort, no intercostal retractions, no accessory muscle use. Heart: RRR,  no murmur.  Lower extremities: no pretibial edema bilaterally  Skin: Not pale. Not jaundice Neurologic:  alert & oriented X3.  Speech normal, gait appropriate for age and unassisted Psych--  Cognition and judgment appear intact.  Cooperative with normal attention span and concentration.  Behavior appropriate. No anxious or depressed appearing.      Assessment     Problem list DM HTN Hyperlipidemia GERD Anxiety- insomnia  BPH Cardiovascular:Dr Carolynne Citron  --  Cath x 2 [id number]: no significant CAD -- CHF, non-ischemic  --Atrial  fibrillation; h/o AV node ablation, has a Pacemaker (biventricular pacing) --h/o IC bleeding: Not anticoagulated --Peripheral vascular disease Pulmonary: Dr Waymond Hailey, last OV 10-18-2014, f/u prn --COPD, severe, chronic resp failure, night O2 prn, has portable O2  --Pleural effusion s/p decortication --Pulmonary hypertension  Stasis dermatitis  Osteoporosis: T-score -4.1.  Evenity none available from men.  First Prolia  shot 03/14/2023.  PLAN: DM: Currently on Farxiga , ambulatory CBGs in the 160 range, check A1c, micro. HTN: BP looks good, recommend to check from time to time, continue bisoprolol , amlodipine , Lasix , check CMP. Hyperlipidemia: On atorvastatin , checking FLP. A-fib: Saw cardiology 11/15/2023, no changes made COPD: LOV with pulmonary 11/09/2023, recommend to continue ipratropium-albuterol  nebs, budesonide  nebs, oxygen supplements,  increase to 5 L with activity. Osteoporosis, next Prolia  03/2024 Vaccine advice: Tdap 09-2013 PNM 23: 2020;PNM 13: 2016; PNM 20: 11/15/2022 S/p Zostavax and shingrix  Vaccines are recommended: Flu shot every fall, Tdap, RSV, COVID booster if not done recently RTC 4 months

## 2024-01-25 NOTE — Patient Instructions (Addendum)
 INSTRUCTIONS  FOR TODAY   Recommend the following vaccines: Tdap RSV A COVID booster if not done recently Flu shot every fall.   INSTRUCTIONS  FOR TODAY   Check the  blood pressure regularly Blood pressure goal:  between 110/65 and  135/85. If it is consistently higher or lower, let me know     GO TO THE LAB : Get the blood work     Next office visit for a checkup in 4 months Please make an appointment before you leave today Depending on your blood or XRs results it might be necessary to come back sooner

## 2024-01-26 ENCOUNTER — Encounter: Payer: Self-pay | Admitting: Internal Medicine

## 2024-01-27 NOTE — Assessment & Plan Note (Signed)
 DM: Currently on Farxiga , ambulatory CBGs in the 160 range, check A1c, micro. HTN: BP looks good, recommend to check from time to time, continue bisoprolol , amlodipine , Lasix , check CMP. Hyperlipidemia: On atorvastatin , checking FLP. A-fib: Saw cardiology 11/15/2023, no changes made COPD: LOV with pulmonary 11/09/2023, recommend to continue ipratropium-albuterol  nebs, budesonide  nebs, oxygen supplements, increase to 5 L with activity. Osteoporosis, next Prolia  03/2024 Vaccine advice  RTC 4 months

## 2024-01-27 NOTE — Assessment & Plan Note (Signed)
  Vaccine advice: Tdap 09-2013 PNM 23: 2020;PNM 13: 2016; PNM 20: 11/15/2022 S/p Zostavax and shingrix  Vaccines are recommended: Flu shot every fall, Tdap, RSV, COVID booster if not done recently

## 2024-02-13 ENCOUNTER — Ambulatory Visit (INDEPENDENT_AMBULATORY_CARE_PROVIDER_SITE_OTHER): Payer: Medicare Other

## 2024-02-13 ENCOUNTER — Telehealth: Payer: Self-pay | Admitting: Pharmacist

## 2024-02-13 ENCOUNTER — Telehealth: Payer: Self-pay

## 2024-02-13 ENCOUNTER — Ambulatory Visit: Payer: Medicare Other | Admitting: Pharmacist

## 2024-02-13 DIAGNOSIS — I495 Sick sinus syndrome: Secondary | ICD-10-CM | POA: Diagnosis not present

## 2024-02-13 DIAGNOSIS — I10 Essential (primary) hypertension: Secondary | ICD-10-CM

## 2024-02-13 DIAGNOSIS — J449 Chronic obstructive pulmonary disease, unspecified: Secondary | ICD-10-CM

## 2024-02-13 DIAGNOSIS — E1159 Type 2 diabetes mellitus with other circulatory complications: Secondary | ICD-10-CM

## 2024-02-13 NOTE — Progress Notes (Signed)
 Pharmacy Note  02/13/2024 Name: Bradley WALKES Sr. MRN: [id number] DOB: 1941/06/15  Subjective: Bradley Mast Sr. is a 83 y.o. year old male who is a primary care patient of Ezell Hollow, MD. Engaged with patient by telephone.   Patietn reports he has been using a weekly pill container / reminder system. Feels that this has been useful in keeping him on track with his medication regimen.  I reviewed his refill history in Dr Anson Basta database and looks like the following meds would be past due:  Atorvastatin  40mg  - LR 90 days on 11/03/2023 Bisoprolol  10mg  -  LR 90 days on 11/03/2023  The following would be due soon:  Furosemide  40mg  - LR 90 days on 11/17/2023 Tamsulosin  0.4mg  - LR 90 days on 11/17/2023  Patient reports he has a full bottle of atorvastatin  and bisoprolol    COPD -  pulmonologist - Dr Cleo Dace.  Current therapy: budesonide  nebs twice a day and Duonebs as needed.  O2 setting is 4L   Patient also has albuterol  inhaler to use when away from home as needed for shortness of breath and wheezing. He reports that he rarely needs to use albuterol . Reports no concerns with breathing.   Hypertension / CHF -  Current therapy: amlodipine  10mg  daily, furosemide  40mg  twice a day, Farxiga  10mg  daily and bisoprolol  10mg  daily.   BP Readings from Last 3 Encounters:  01/25/24 126/60  11/15/23 120/78  11/09/23 117/60    Hyperlipidemia:  Current therapy: atorvastatin  40mg  daily.  Dose of atorvastatin  was increased about 1 year ago. LDL decreased from 108 (10/19/2022) to 76 (11/15/2022).   Osteoporosis:  Current therapy - prolia  60mg  every 6 months. (Dr Neomi Banks initially recommended Evenity x 12 months but was denied by insurance) 10/06/2023 received Prolia - next due around 04/05/2024. Cost of Prolia  in past has been $0 Current supplementation: Calcium  + D once daily and MVI once daily   DEXA 02/17/2023 - Forearm Radius 33% T-score of -4.1  Lumbar spine was not utilized due to advanced  degenerative changes.  DualFemur Total Mean  -2.7  Left Forearm Radius 33% was -4.1  Objective: Review of patient status, including review of consultants reports, laboratory and other test data, was performed as part of comprehensive evaluation and provision of chronic care management services.   Lab Results  Component Value Date   CREATININE 0.84 01/25/2024   CREATININE 0.82 10/03/2023   CREATININE 0.91 07/25/2023    Lab Results  Component Value Date   HGBA1C 6.1 01/25/2024       Component Value Date/Time   CHOL 145 01/25/2024 1136   CHOL 142 11/15/2022 0931   TRIG 76.0 01/25/2024 1136   HDL 53.90 01/25/2024 1136   HDL 54 11/15/2022 0931   CHOLHDL 3 01/25/2024 1136   VLDL 15.2 01/25/2024 1136   LDLCALC 76 01/25/2024 1136   LDLCALC 73 11/15/2022 0931   LDLDIRECT 76 11/15/2022 0928   Lab Results  Component Value Date   CHOL 145 01/25/2024   CHOL 142 11/15/2022   CHOL 180 10/07/2022   Lab Results  Component Value Date   HDL 53.90 01/25/2024   HDL 54 11/15/2022   HDL 58 10/07/2022   Lab Results  Component Value Date   LDLCALC 76 01/25/2024   LDLCALC 73 11/15/2022   LDLCALC 109 (H) 10/07/2022   Lab Results  Component Value Date   TRIG 76.0 01/25/2024   TRIG 76 11/15/2022   TRIG 67 10/07/2022   Lab Results  Component  Value Date   CHOLHDL 3 01/25/2024   CHOLHDL 2.6 11/15/2022   CHOLHDL 3.1 10/07/2022   Lab Results  Component Value Date   LDLDIRECT 76 11/15/2022   LDLDIRECT 108 (H) 10/19/2022     Clinical ASCVD: Yes  The ASCVD Risk score (Arnett DK, et al., 2019) failed to calculate for the following reasons:   The 2019 ASCVD risk score is only valid for ages 86 to 30   Risk score cannot be calculated because patient has a medical history suggesting prior/existing ASCVD    BP Readings from Last 3 Encounters:  01/25/24 126/60  11/15/23 120/78  11/09/23 117/60     Allergies  Allergen Reactions   Hydrocodone  Other (See Comments)    "given to  him in the hospital; went thru withdrawals once home; dr said not to take it again" (09/27/2012) dizziness   Ultram  [Tramadol ] Other (See Comments)    "given to him in the hospital; went thru withdrawals once home; dr said not to take it again" (09/27/2012) dizziness   Cialis [Tadalafil] Other (See Comments)    Headache Backache  Dizziness    Avelox  [Moxifloxacin  Hydrochloride] Itching and Other (See Comments)    Headache Dizziness    Medications Reviewed Today   Medications were not reviewed in this encounter     Patient Active Problem List   Diagnosis Date Noted   Cellulitis of right leg 07/04/2023   Acute encephalopathy 07/04/2023   Viral pneumonia 05/26/2023   Acute on chronic congestive heart failure (HCC) 10/05/2022   Acute respiratory failure with hypoxemia (HCC) 08/27/2022   COVID 08/23/2022   Displaced fracture of shaft of third metacarpal bone, left hand, sub encounter for closed fracture 08/03/2022   ILD (interstitial lung disease) (HCC) 07/30/2022   CAD (coronary artery disease) 07/19/2022   Acute on chronic respiratory failure with hypoxia (HCC) 07/18/2022   GERD without esophagitis 07/30/2021   Cor pulmonale (chronic) (HCC) 08/05/2020   COPD exacerbation (HCC) 12/22/2018   COPD,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 12/22/2018   PCP NOTES >>> 07/15/2015   Annual physical exam 05/08/2012   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   Sinoatrial node dysfunction (HCC)    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   Chronic diastolic heart failure (HCC)    PAD (peripheral artery disease) (HCC) 10/22/2010   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  Patient affirms current coverage meets needs. Patinet has Medicare Part D coverage and additional coverage thru [employer]  retirement benefits.    Assessment / Plan:  Hypertension / CHF: goal blood pressure < 130/80 Continue bisoprolol  10mg  daily and amlodipine  10mg  daily for blood pressure.  Continue Farxiga  and furosemide  for CHF Continue to check weight daily Continue to limit intake of sodium in diet.  Check blood pressure 2 to 3 times per week and record (can record on same sheet as weight)   COPD:  Continue budesonide  and DuoNeb nebs  Reminded to use albuterol  in need when away from home and gets shortness of breath   Hyperlipidemia: goal LDL < 70  Continue to take atorvastatin  40mg  daily  Osteoporosis:  Continue Prolia  60mg  every 6 months. Next dose 04/05/2024 - sent message to Prolia  team to make sure insuranc coverage is being checked prior to 04/05/2024 appointment.  Continue Calcium  + D once daily and MVI once daily   Medication Management:  Reviewed med list.  Discussed using medication packaging. Patient is considering (might also depend on if cost will change if he uses [facility] versus [facility] pharmacy).  Reviewed refill history and adherence. Called [facility] and verified last refill for atorvastatin  and bisoprolol  was actually 02/04/2024 for 90 day supply.  [facility] will ship tamsulosin  and furosemide  02/15/2024   Follow Up:  3 months.   Cecilie Coffee, PharmD Clinical Pharmacist [facility]  - [facility]

## 2024-02-13 NOTE — Telephone Encounter (Signed)
 See phone notes. Patient already called.

## 2024-02-13 NOTE — Telephone Encounter (Signed)
 Patient will be due for his next Prolia  injection around 04/05/2024  Last Prolia  injection was given in PCP office 10/05/2024  Appointment schedudule? Yes - 04/05/2024  Prolia  EOB completed? No  Forwarding to Prolia  Team to check cost and if prior authorization needed.

## 2024-02-13 NOTE — Telephone Encounter (Signed)
 Copied from CRM [id number]. Topic: Appointments - Appointment Info/Confirmation >> Feb 13, 2024 10:32 AM Howard Macho wrote: Patient/patient representative is calling for information regarding an appointment.  Patient called stating he is returning the pharmacist call

## 2024-02-13 NOTE — Telephone Encounter (Signed)
 Pt is in workque to be done for benefits.  They usually will run one month prior to when it is due.

## 2024-02-14 ENCOUNTER — Encounter: Payer: Self-pay | Admitting: Internal Medicine

## 2024-02-14 LAB — CUP PACEART REMOTE DEVICE CHECK
Battery Remaining Longevity: 117 mo
Battery Voltage: 3.02 V
Brady Statistic AP VP Percent: 0 %
Brady Statistic AP VS Percent: 0 %
Brady Statistic AS VP Percent: 97.83 %
Brady Statistic AS VS Percent: 2.17 %
Brady Statistic RA Percent Paced: 0 %
Brady Statistic RV Percent Paced: 97.82 %
Date Time Interrogation Session: 20250504210244
Implantable Lead Connection Status: 753985
Implantable Lead Connection Status: 753985
Implantable Lead Implant Date: 20121128
Implantable Lead Implant Date: 20121128
Implantable Lead Location: 753858
Implantable Lead Location: 753860
Implantable Lead Model: 4194
Implantable Lead Model: 5076
Implantable Pulse Generator Implant Date: 20240131
Lead Channel Impedance Value: 304 Ohm
Lead Channel Impedance Value: 3363 Ohm
Lead Channel Impedance Value: 3363 Ohm
Lead Channel Impedance Value: 399 Ohm
Lead Channel Impedance Value: 399 Ohm
Lead Channel Impedance Value: 456 Ohm
Lead Channel Impedance Value: 456 Ohm
Lead Channel Impedance Value: 513 Ohm
Lead Channel Impedance Value: 646 Ohm
Lead Channel Pacing Threshold Amplitude: 0.75 V
Lead Channel Pacing Threshold Amplitude: 1.25 V
Lead Channel Pacing Threshold Pulse Width: 0.4 ms
Lead Channel Pacing Threshold Pulse Width: 0.4 ms
Lead Channel Sensing Intrinsic Amplitude: 10.75 mV
Lead Channel Sensing Intrinsic Amplitude: 4.75 mV
Lead Channel Setting Pacing Amplitude: 2 V
Lead Channel Setting Pacing Amplitude: 2 V
Lead Channel Setting Pacing Pulse Width: 0.4 ms
Lead Channel Setting Pacing Pulse Width: 0.4 ms
Lead Channel Setting Sensing Sensitivity: 4 mV
Zone Setting Status: 755011

## 2024-03-05 ENCOUNTER — Other Ambulatory Visit: Payer: Self-pay | Admitting: Internal Medicine

## 2024-03-06 ENCOUNTER — Telehealth: Payer: Self-pay

## 2024-03-06 NOTE — Telephone Encounter (Signed)
 Bradley [name]

## 2024-03-06 NOTE — Telephone Encounter (Signed)
 Pt ready for scheduling for PROLIA  on or after : 04/05/24  Option# 1: Buy/Bill (Office supplied medication)  Out-of-pocket cost due at time of clinic visit: $0  Number of injection/visits approved: ---  Primary: MEDICARE Prolia  co-insurance: 0% Admin fee co-insurance: 0%  Secondary: [id number] Prolia  co-insurance:  Admin fee co-insurance:   Medical Benefit Details: Date Benefits were checked: 03/06/24 Deductible: $257 Met of $257 Required/ Coinsurance: 0%/ Admin Fee: 0%  Prior Auth: N/A PA# Expiration Date:   # of doses approved: ----------------------------------------------------------------------- Option# 2- Med Obtained from pharmacy:  Pharmacy benefit: Copay $--- (Paid to pharmacy) Admin Fee: --- (Pay at clinic)  Prior Auth: --- PA# Expiration Date:   # of doses approved:   If patient wants fill through the pharmacy benefit please send prescription to: ---, and include estimated need by date in rx notes. Pharmacy will ship medication directly to the office.  Patient NOT eligible for Prolia  Copay Card. Copay Card can make patient's cost as little as $25. Link to apply: [email or web address]://www.amgensupportplus.com/copay  ** This summary of benefits is an estimation of the patient's out-of-pocket cost. Exact cost may very based on individual plan coverage.

## 2024-03-06 NOTE — Telephone Encounter (Signed)
 Prolia VOB initiated via [email or web address]  Next Prolia inj DUE: 04/05/24

## 2024-03-12 ENCOUNTER — Encounter: Payer: Self-pay | Admitting: Student

## 2024-03-12 ENCOUNTER — Ambulatory Visit: Attending: Student | Admitting: Student

## 2024-03-12 VITALS — BP 110/70 | HR 83 | Ht 68.0 in | Wt 172.0 lb

## 2024-03-12 DIAGNOSIS — I5032 Chronic diastolic (congestive) heart failure: Secondary | ICD-10-CM

## 2024-03-12 DIAGNOSIS — I10 Essential (primary) hypertension: Secondary | ICD-10-CM | POA: Diagnosis not present

## 2024-03-12 DIAGNOSIS — I4821 Permanent atrial fibrillation: Secondary | ICD-10-CM

## 2024-03-12 LAB — CUP PACEART INCLINIC DEVICE CHECK
Battery Remaining Longevity: 104 mo
Battery Voltage: 3.01 V
Brady Statistic AP VP Percent: 0 %
Brady Statistic AP VS Percent: 0 %
Brady Statistic AS VP Percent: 98.15 %
Brady Statistic AS VS Percent: 1.85 %
Brady Statistic RA Percent Paced: 0 %
Brady Statistic RV Percent Paced: 98.15 %
Date Time Interrogation Session: 20250602090917
Implantable Lead Connection Status: 753985
Implantable Lead Connection Status: 753985
Implantable Lead Implant Date: 20121128
Implantable Lead Implant Date: 20121128
Implantable Lead Location: 753858
Implantable Lead Location: 753860
Implantable Lead Model: 4194
Implantable Lead Model: 5076
Implantable Pulse Generator Implant Date: 20240131
Lead Channel Impedance Value: 3363 Ohm
Lead Channel Impedance Value: 3363 Ohm
Lead Channel Impedance Value: 342 Ohm
Lead Channel Impedance Value: 399 Ohm
Lead Channel Impedance Value: 456 Ohm
Lead Channel Impedance Value: 456 Ohm
Lead Channel Impedance Value: 494 Ohm
Lead Channel Impedance Value: 570 Ohm
Lead Channel Impedance Value: 722 Ohm
Lead Channel Pacing Threshold Amplitude: 0.625 V
Lead Channel Pacing Threshold Amplitude: 1.5 V
Lead Channel Pacing Threshold Pulse Width: 0.4 ms
Lead Channel Pacing Threshold Pulse Width: 0.4 ms
Lead Channel Sensing Intrinsic Amplitude: 10.75 mV
Lead Channel Sensing Intrinsic Amplitude: 4.75 mV
Lead Channel Setting Pacing Amplitude: 2 V
Lead Channel Setting Pacing Amplitude: 2.75 V
Lead Channel Setting Pacing Pulse Width: 0.4 ms
Lead Channel Setting Pacing Pulse Width: 0.4 ms
Lead Channel Setting Sensing Sensitivity: 4 mV
Zone Setting Status: 755011

## 2024-03-12 NOTE — Patient Instructions (Signed)
 Medication Instructions:  No medication changes today. *If you need a refill on your cardiac medications before your next appointment, please call your pharmacy*  Lab Work: No labwork ordered today. If you have labs (blood work) drawn today and your tests are completely normal, you will receive your results only by: MyChart Message (if you have MyChart) OR A paper copy in the mail If you have any lab test that is abnormal or we need to change your treatment, we will call you to review the results.  Testing/Procedures: No testing ordered today  Follow-Up: At [facility], you and your health needs are our priority.  As part of our continuing mission to provide you with exceptional heart care, our providers are all part of one team.  This team includes your primary Cardiologist (physician) and Advanced Practice Providers or APPs (Physician Assistants and Nurse Practitioners) who all work together to provide you with the care you need, when you need it.  Your next appointment:   12 month(s)  Provider:   You may see Manya Sells, MD or one of the following Advanced Practice Providers on your designated Care Team:   Mertha Abrahams, Kennard Pea "Jonelle Neri" [place], PA-C Suzann Riddle, NP Creighton Doffing, NP    We recommend signing up for the patient portal called "MyChart".  Sign up information is provided on this After Visit Summary.  MyChart is used to connect with patients for Virtual Visits (Telemedicine).  Patients are able to view lab/test results, encounter notes, upcoming appointments, etc.  Non-urgent messages can be sent to your provider as well.   To learn more about what you can do with MyChart, go to [email or web address].

## 2024-03-12 NOTE — Progress Notes (Signed)
  Electrophysiology Office Note:   ID:  Bradley Mast Sr., DOB 28-Dec-1940, MRN [id number]  Primary Cardiologist: None Electrophysiologist: Manya Sells, MD      History of Present Illness:   Bradley Mast Sr. is a 83 y.o. 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   Since last being seen in our clinic the patient reports doing very well from a cardiac perspective. Overall, he denies chest pain, palpitations, PND, orthopnea, nausea, vomiting, dizziness, syncope, edema, weight gain, or early satiety.  Has chronic stable DOE on O2 with COPD.   Review of systems complete and found to be negative unless listed in HPI.   EP Information / Studies Reviewed:    EKG is not ordered today. EKG from 09/2023 reviewed which showed AF with BIV pacing with LV activation and QRS of ~137 ms       PPM Interrogation-  reviewed in detail today,  See PACEART report.  Arrhythmia/Device History Bi V PPM-Medtronic   Physical Exam:   VS:  There were no vitals taken for this visit.   Wt Readings from Last 3 Encounters:  01/25/24 170 lb (77.1 kg)  11/15/23 174 lb 3.2 oz (79 kg)  11/09/23 173 lb 6.4 oz (78.7 kg)     GEN: No acute distress  NECK: No JVD; No carotid bruits CARDIAC: Regular rate and rhythm, no murmurs, rubs, gallops RESPIRATORY:  Clear to auscultation without rales, wheezing or rhonchi  ABDOMEN: Soft, non-tender, non-distended EXTREMITIES:  No edema; No deformity   ASSESSMENT AND PLAN:    Uncontrolled atrial arrhyhtmia s/p AV node ablation s/p Medtronic PPM  Normal PPM function See Pace Art report No changes today  HTN Stable on current regimen   Chronic diastolic CHF LVEF 50-55% 06/2023 Volume status stable on exam and optivol  COPD Continue chronic O2  Disposition:   Follow up with EP APP in 12 months  Signed, Tylene Galla, PA-C

## 2024-04-02 NOTE — Progress Notes (Signed)
 Remote pacemaker transmission.

## 2024-04-02 NOTE — Addendum Note (Signed)
 Addended by: TAWNI DRILLING D on: 04/02/2024 09:27 AM   Modules accepted: Orders

## 2024-04-05 ENCOUNTER — Ambulatory Visit (INDEPENDENT_AMBULATORY_CARE_PROVIDER_SITE_OTHER): Payer: Medicare Other

## 2024-04-05 DIAGNOSIS — M810 Age-related osteoporosis without current pathological fracture: Secondary | ICD-10-CM | POA: Diagnosis not present

## 2024-04-05 MED ORDER — DENOSUMAB 60 MG/ML ~~LOC~~ SOSY: 60.0000 mg | PREFILLED_SYRINGE | SUBCUTANEOUS | Status: DC

## 2024-04-09 NOTE — Progress Notes (Signed)
 Pt was in office today for Prolia  injection, injection was given subcutaneous in L arm.

## 2024-04-10 ENCOUNTER — Ambulatory Visit

## 2024-04-11 ENCOUNTER — Ambulatory Visit

## 2024-04-11 ENCOUNTER — Ambulatory Visit (INDEPENDENT_AMBULATORY_CARE_PROVIDER_SITE_OTHER)

## 2024-04-11 VITALS — BP 135/76 | Ht 68.0 in | Wt 185.0 lb

## 2024-04-11 DIAGNOSIS — Z0000 Encounter for general adult medical examination without abnormal findings: Secondary | ICD-10-CM

## 2024-04-11 NOTE — Progress Notes (Signed)
 Because this visit was a virtual/telehealth visit,  certain criteria was not obtained, such a blood pressure, CBG if applicable, and timed get up and go. Any medications not marked as taking were not mentioned during the medication reconciliation part of the visit. Any vitals not documented were not able to be obtained due to this being a telehealth visit or patient was unable to self-report a recent blood pressure reading due to a lack of equipment at home via telehealth. Vitals that have been documented are verbally provided by the patient.  This visit was performed by a medical professional under my direct supervision. I was immediately available for consultation/collaboration. I have reviewed and agree with the Annual Wellness Visit documentation.  Subjective:   Bradley JINNY Oliva Sr. is a 83 y.o. who presents for a Medicare Wellness preventive visit.  As a reminder, Annual Wellness Visits don't include a physical exam, and some assessments may be limited, especially if this visit is performed virtually. We may recommend an in-person follow-up visit with your provider if needed.  Visit Complete: Virtual I connected with  Bradley JINNY Oliva Sr. on 04/11/24 by a audio enabled telemedicine application and verified that I am speaking with the correct person using two identifiers.  Patient Location: Home  Provider Location: Home Office  I discussed the limitations of evaluation and management by telemedicine. The patient expressed understanding and agreed to proceed.  Vital Signs: Because this visit was a virtual/telehealth visit, some criteria may be missing or patient reported. Any vitals not documented were not able to be obtained and vitals that have been documented are patient reported.  VideoDeclined- This patient declined Librarian, academic. Therefore the visit was completed with audio only.  Persons Participating in Visit: Patient.  AWV Questionnaire: No: Patient  Medicare AWV questionnaire was not completed prior to this visit.  Cardiac Risk Factors include: advanced age (>68men, >2 women);male gender;diabetes mellitus;dyslipidemia;hypertension;Other (see comment), Risk factor comments: copd     Objective:    Today's Vitals   04/11/24 1351  BP: 135/76  Weight: 185 lb (83.9 kg)  Height: 5' 8 (1.727 m)   Body mass index is 28.13 kg/m.     04/11/2024    1:57 PM 07/05/2023    1:59 AM 07/04/2023    4:06 PM 06/28/2023    4:54 PM 05/26/2023   10:25 PM 05/25/2023   11:50 PM 05/14/2023    4:28 AM  Advanced Directives  Does Patient Have a Medical Advance Directive? Yes No Yes No Yes No Yes  Type of Estate agent of [place];Living will  Living will  Living will    Does patient want to make changes to medical advance directive? No - Patient declined    No - Patient declined    Copy of Healthcare Power of Attorney in Chart? No - copy requested        Would patient like information on creating a medical advance directive?  No - Patient declined  No - Patient declined No - Patient declined No - Patient declined     Current Medications (verified) Outpatient Encounter Medications as of 04/11/2024  Medication Sig   acetaminophen  (TYLENOL ) 500 MG tablet Take 1,000 mg by mouth every 8 (eight) hours as needed for headache or moderate pain.   albuterol  (PROVENTIL ) (2.5 MG/3ML) 0.083% nebulizer solution Take 3 mLs (2.5 mg total) by nebulization every 6 (six) hours as needed for wheezing or shortness of breath.   amLODipine  (NORVASC ) 10 MG tablet Take  1 tablet (10 mg total) by mouth daily.   aspirin  81 MG tablet Take 81 mg by mouth every morning.    atorvastatin  (LIPITOR) 40 MG tablet TAKE 1 TABLET AT BEDTIME   bisoprolol  (ZEBETA ) 10 MG tablet TAKE 1 TABLET DAILY   budesonide  (PULMICORT ) 0.5 MG/2ML nebulizer solution Take 2 mLs (0.5 mg total) by nebulization 2 (two) times daily.   calcium -vitamin D  (OSCAL WITH D) 500-200 MG-UNIT tablet Take 1  tablet by mouth every morning.   cholecalciferol  (VITAMIN D3) 25 MCG (1000 UNIT) tablet Take 1,000 Units by mouth every evening.   dapagliflozin  propanediol (FARXIGA ) 10 MG TABS tablet Take 1 tablet (10 mg total) by mouth daily before breakfast.   diphenhydramine -acetaminophen  (TYLENOL  PM) 25-500 MG TABS tablet Take 1 tablet by mouth at bedtime as needed (sleep).   furosemide  (LASIX ) 40 MG tablet TAKE 2 TABLETS EVERY MORNING AND 1 TABLET LATE IN THE AFTERNOON   glucose blood (ONETOUCH VERIO) test strip USE TO CHECK BLOOD SUGAR NO MORE THAN TWICE A DAY   Ipratropium-Albuterol  (COMBIVENT  RESPIMAT) 20-100 MCG/ACT AERS respimat Inhale 1 puff into the lungs every 6 (six) hours as needed for wheezing or shortness of breath.   ipratropium-albuterol  (DUONEB) 0.5-2.5 (3) MG/3ML SOLN Take 3 mLs by nebulization every 6 (six) hours as needed (for shortness of breath).   Lancets (ONETOUCH ULTRASOFT) lancets Check blood sugars no more than twice daily   Multiple Vitamins-Minerals (MULTIVITAMINS THER. W/MINERALS) TABS tablet Take 1 tablet by mouth daily.   nitroGLYCERIN  (NITROSTAT ) 0.4 MG SL tablet Place 1 tablet (0.4 mg total) under the tongue every 5 (five) minutes x 3 doses as needed for chest pain.   OXYGEN Inhale 4 L/min into the lungs continuous.   pantoprazole  (PROTONIX ) 40 MG tablet Take 1 tablet (40 mg total) by mouth daily before breakfast.   tamsulosin  (FLOMAX ) 0.4 MG CAPS capsule Take 1 capsule (0.4 mg total) by mouth daily after supper.   Facility-Administered Encounter Medications as of 04/11/2024  Medication   [START ON 10/02/2024] denosumab  (PROLIA ) injection 60 mg    Allergies (verified) Hydrocodone , Ultram  [tramadol ], Cialis [tadalafil], and Avelox  [moxifloxacin  hydrochloride]   History: Past Medical History:  Diagnosis Date   Abnormal CT scan, chest 2012   CT chest, several lymphadenopathies. Sees pulmonary   Anemia    intermittent   Arthritis    ?back (09/19/2018)   Atrial  fibrillation (HCC)    s/p AV node ablation & BiV PPM implantation 09/08/11 (op dictation pending)   BPH (benign prostatic hyperplasia)    Saw Dr Amiel 2004, normal renal u/s   Bronchitis 08/26/2017   CAD (coronary artery disease)    CHF (congestive heart failure) (HCC)    Thought primarily to be non-systolic although EF down (EF 54-49% 12/2010, down to 35-40% 09/05/11), cath 2008 with no CAD, nuclear study 07/2011 showing Small area of reversibility in the distal ant/lat wall the left ventricle suspicious for ischemia/septal wall HK but felt to be low risk  (per D/C Summary 07/2011)   Chronic bronchitis (HCC)    get it ~ q yr   Chronic diastolic heart failure (HCC)         Chronic respiratory failure with hypoxia and hypercapnia (HCC) 09/14/2010   Followed in Pulmonary clinic/ [facility]/ [facility]  Started on 02 2lpm at discharge 09/23/11   - PFT's 10/28/2011  FEV1  1.40 (51%)  with ratio 70 and no better p B2 and DLCO 53 corrects to 101    - PFT's 10/18/2014  FEV1 1.71 (59%) with ratio 68 and no sign change p B2 and DLCO 53 corrects to 85  -  HC03   07/28/20  = 33  -  HCO3   03/17/21     = 31     Heart murmur    HLD (hyperlipidemia)    HTN (hypertension)    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   Pleural effusion 2008   S/p decortication   Pneumonia 08/02/2011   Pulmonary HTN (HCC)    per cath 2008   Type II diabetes mellitus (HCC) 1999   Past Surgical History:  Procedure Laterality Date   BI-VENTRICULAR PACEMAKER INSERTION Left 09/08/2011   Procedure: BI-VENTRICULAR PACEMAKER INSERTION (CRT-P);  Surgeon: Danelle LELON Birmingham, MD;  Location: [facility] CATH LAB;  Service: Cardiovascular;  Laterality: Left;   BIV PACEMAKER GENERATOR CHANGEOUT N/A 11/10/2022   Procedure: BIV PACEMAKER GENERATOR CHANGEOUT;   Surgeon: Birmingham Danelle LELON, MD;  Location: [facility] INVASIVE CV LAB;  Service: Cardiovascular;  Laterality: N/A;   CARDIAC CATHETERIZATION     couple times; never had balloon or stent (09/19/2018)   CATARACT EXTRACTION W/ INTRAOCULAR LENS  IMPLANT, BILATERAL Bilateral 08/2018   COLONOSCOPY  03/10/11   normal   INSERT / REPLACE / REMOVE PACEMAKER  09/08/11   pacemaker placement   LUNG DECORTICATION     PLEURAL SCARIFICATION     pneumothorax with fibrothorax  ~ 2010   RIGHT/LEFT HEART CATH AND CORONARY ANGIOGRAPHY N/A 10/05/2022   Procedure: RIGHT/LEFT HEART CATH AND CORONARY ANGIOGRAPHY;  Surgeon: Dann Candyce RAMAN, MD;  Location: [facility] INVASIVE CV LAB;  Service: Cardiovascular;  Laterality: N/A;   TONSILLECTOMY     as a kid    Family History  Problem Relation Age of Onset   Diabetes Father    Coronary artery disease Father    Polycythemia Mother    Drug abuse Son    Breast cancer Maternal Aunt    Prostate cancer Neg Hx    Colon cancer Neg Hx    Social History   Socioeconomic History   Marital status: Widowed    Spouse name: [place]  Guild   Number of children: 1   Years of education: Not on file   Highest education level: Not on file  Occupational History   Occupation: retired Publishing rights manager: RETIRED  Tobacco Use   Smoking status: Former    Current packs/day: 0.00    Average packs/day: 2.5 packs/day for 25.0 years (62.5 ttl pk-yrs)    Types: Cigarettes, Cigars    Start date: 10/12/1955    Quit date: 10/11/1980    Years since quitting: 43.5   Smokeless tobacco: Never  Vaping Use   Vaping status: Never Used  Substance and Sexual Activity   Alcohol use: Not Currently    Comment: 1 can of beer ever 4-5 months   Drug use: No   Sexual activity: Not Currently  Other Topics Concern   Not on file  Social History Narrative   Moved from [place]..   Wife, [place]  passed away 07-26-23   1 son, substance abuse, passed away 09-24-16         Social Drivers of Health    Financial Resource Strain: Low Risk  (04/11/2024)  Overall Financial Resource Strain (CARDIA)    Difficulty of Paying Living Expenses: Not hard at all  Food Insecurity: No Food Insecurity (04/11/2024)   Hunger Vital Sign    Worried About Running Out of Food in the Last Year: Never true    Ran Out of Food in the Last Year: Never true  Transportation Needs: No Transportation Needs (04/11/2024)   PRAPARE - Administrator, Civil Service (Medical): No    Lack of Transportation (Non-Medical): No  Physical Activity: Insufficiently Active (04/11/2024)   Exercise Vital Sign    Days of Exercise per Week: 4 days    Minutes of Exercise per Session: 20 min  Stress: No Stress Concern Present (04/11/2024)   [employer] of Occupational Health - Occupational Stress Questionnaire    Feeling of Stress: Not at all  Social Connections: Moderately Isolated (04/11/2024)   Social Connection and Isolation Panel    Frequency of Communication with Friends and Family: Three times a week    Frequency of Social Gatherings with Friends and Family: Three times a week    Attends Religious Services: More than 4 times per year    Active Member of Clubs or Organizations: No    Attends Banker Meetings: Never    Marital Status: Widowed    Tobacco Counseling Counseling given: Not Answered    Clinical Intake:  Pre-visit preparation completed: Yes  Pain : No/denies pain     BMI - recorded: 28.13 Nutritional Status: BMI 25 -29 Overweight Nutritional Risks: None Diabetes: Yes CBG done?: No Did pt. bring in CBG monitor from home?: No  Lab Results  Component Value Date   HGBA1C 6.1 01/25/2024   HGBA1C 6.7 (H) 10/26/2023   HGBA1C 6.2 07/25/2023     How often do you need to have someone help you when you read instructions, pamphlets, or other written materials from your doctor or pharmacy?: 1 - Never  Interpreter Needed?: No  Information entered by :: [employer]   Activities of Daily Living     04/11/2024    1:56 PM 07/05/2023    1:59 AM  In your present state of health, do you have any difficulty performing the following activities:  Hearing? 0 0  Vision? 0 0  Difficulty concentrating or making decisions? 0 0  Walking or climbing stairs? 0 1  Dressing or bathing? 1 0  Doing errands, shopping? 0 0  Preparing Food and eating ? N   Using the Toilet? N   In the past six months, have you accidently leaked urine? N   Do you have problems with loss of bowel control? N   Managing your Medications? N   Managing your Finances? N   Housekeeping or managing your Housekeeping? N     Patient Care Team: Amon Aloysius BRAVO, MD as PCP - General (Internal Medicine) Waddell Danelle ORN, MD as PCP - Electrophysiology (Cardiology) Waddell Danelle ORN, MD as Consulting Physician (Cardiology) Darlean Ozell NOVAK, MD as Consulting Physician (Pulmonary Disease) Lazaro Glatter, MD as Consulting Physician (Otolaryngology) [place], [place], OD (Optometry) Raford Riggs, MD as Consulting Physician (Cardiology) Carla Milling, RPH-CPP (Pharmacist)  I have updated your Care Teams any recent Medical Services you may have received from other providers in the past year.     Assessment:   This is a routine wellness examination for Bradley [name].  Hearing/Vision screen Hearing Screening - Comments:: Patient has no difficulties  Vision Screening - Comments:: Patient has glasses    Goals Addressed  This Visit's Progress     Maintain current health (pt-stated)   On track      Depression Screen     04/11/2024    1:58 PM 01/25/2024   10:51 AM 10/10/2023    3:04 PM 07/25/2023    8:37 AM 04/08/2023    9:45 AM 02/15/2023    2:22 PM 02/02/2023    2:49 PM  PHQ 2/9 Scores  PHQ - 2 Score 0 0 0 0 0 0 0  PHQ- 9 Score 0     0 0    Fall Risk     04/11/2024    1:57 PM 01/25/2024   10:48 AM 11/09/2023    8:49 AM 10/10/2023    3:04 PM 07/25/2023    8:37 AM  Fall  Risk   Falls in the past year? 0 0 0 1 1  Number falls in past yr: 0 0  0 1  Injury with Fall? 0 0  1 1  Risk for fall due to : No Fall Risks      Follow up Falls evaluation completed Falls evaluation completed;Education provided  Falls evaluation completed;Education provided Falls evaluation completed;Education provided    MEDICARE RISK AT HOME:  Medicare Risk at Home Any stairs in or around the home?: Yes If so, are there any without handrails?: No Home free of loose throw rugs in walkways, pet beds, electrical cords, etc?: Yes Adequate lighting in your home to reduce risk of falls?: Yes Life alert?: No Use of a cane, walker or w/c?: No Grab bars in the bathroom?: Yes Shower chair or bench in shower?: Yes Elevated toilet seat or a handicapped toilet?: Yes  TIMED UP AND GO:  Was the test performed?  No  Cognitive Function: 6CIT completed    09/07/2017    1:32 PM  MMSE - Mini Mental State Exam  Not completed: Refused        04/11/2024    1:55 PM 04/08/2023    9:47 AM 04/05/2022   10:52 AM  6CIT Screen  What Year? 0 points 0 points 0 points  What month? 0 points 0 points 0 points  What time? 0 points 0 points 0 points  Count back from 20 0 points 0 points 0 points  Months in reverse 0 points 0 points 0 points  Repeat phrase 0 points 6 points 6 points  Total Score 0 points 6 points 6 points    Immunizations Immunization History  Administered Date(s) Administered   Fluad Quad(high Dose 65+) 06/26/2019, 08/11/2020, 07/13/2022   Fluad Trivalent(High Dose 65+) 07/25/2023   Influenza Split 10/01/2011   Influenza Whole 08/14/2007, 07/11/2009, 09/14/2010   Influenza, High Dose Seasonal PF 09/14/2013, 07/15/2015, 07/09/2016, 07/05/2017, 08/03/2018   Influenza,inj,Quad PF,6+ Mos 07/05/2014   Influenza-Unspecified 08/11/2012, 08/11/2021   Janssen (J&J) SARS-COV-2 Vaccination 01/12/2020, 09/19/2020   PFIZER Comirnaty(Gray Top)Covid-19 Tri-Sucrose Vaccine 03/18/2021    PNEUMOCOCCAL CONJUGATE-20 11/15/2022   Pfizer Covid-19 Vaccine Bivalent Booster 9yrs & up 08/11/2021   Pneumococcal Conjugate Pcv21, Polysaccharide Crm197 Conjugaf 01/27/2024   Pneumococcal Conjugate-13 03/12/2015   Pneumococcal Polysaccharide-23 07/11/2004, 03/30/2009, 11/01/2017, 05/11/2019   Respiratory Syncytial Virus Vaccine,Recomb Aduvanted(Arexvy) 01/27/2024   Td 03/12/2003   Tdap 01/27/2024   Tetanus 09/14/2013   Zoster Recombinant(Shingrix ) 03/11/2022, 09/10/2022   Zoster, Live 04/20/2011    Screening Tests Health Maintenance  Topic Date Due   FOOT EXAM  07/14/2023   Medicare Annual Wellness (AWV)  04/07/2024   INFLUENZA VACCINE  05/11/2024   HEMOGLOBIN A1C  07/26/2024  OPHTHALMOLOGY EXAM  10/25/2024   Diabetic kidney evaluation - eGFR measurement  01/24/2025   Diabetic kidney evaluation - Urine ACR  01/24/2025   DTaP/Tdap/Td (4 - Td or Tdap) 01/26/2034   Pneumococcal Vaccine: 50+ Years  Completed   Zoster Vaccines- Shingrix   Completed   Hepatitis B Vaccines  Aged Out   HPV VACCINES  Aged Out   Meningococcal B Vaccine  Aged Out   COVID-19 Vaccine  Discontinued   Hepatitis C Screening  Discontinued    Health Maintenance  Health Maintenance Due  Topic Date Due   FOOT EXAM  07/14/2023   Medicare Annual Wellness (AWV)  04/07/2024   Health Maintenance Items Addressed:   Additional Screening:  Vision Screening: Recommended annual ophthalmology exams for early detection of glaucoma and other disorders of the eye. Would you like a referral to an eye doctor? No    Dental Screening: Recommended annual dental exams for proper oral hygiene  Community Resource Referral / Chronic Care Management: CRR required this visit?  No   CCM required this visit?  No   Plan:    I have personally reviewed and noted the following in the patient's chart:   Medical and social history Use of alcohol, tobacco or illicit drugs  Current medications and supplements including  opioid prescriptions. Patient is not currently taking opioid prescriptions. Functional ability and status Nutritional status Physical activity Advanced directives List of other physicians Hospitalizations, surgeries, and ER visits in previous 12 months Vitals Screenings to include cognitive, depression, and falls Referrals and appointments  In addition, I have reviewed and discussed with patient certain preventive protocols, quality metrics, and best practice recommendations. A written personalized care plan for preventive services as well as general preventive health recommendations were provided to patient.   Lyle MARLA Right, [place]   04/11/2024   After Visit Summary: (MyChart) Due to this being a telephonic visit, the after visit summary with patients personalized plan was offered to patient via MyChart   Notes: Nothing significant to report at this time.

## 2024-05-01 ENCOUNTER — Other Ambulatory Visit: Payer: Self-pay | Admitting: Internal Medicine

## 2024-05-14 ENCOUNTER — Ambulatory Visit (INDEPENDENT_AMBULATORY_CARE_PROVIDER_SITE_OTHER): Payer: PRIVATE HEALTH INSURANCE

## 2024-05-14 DIAGNOSIS — I495 Sick sinus syndrome: Secondary | ICD-10-CM

## 2024-05-14 LAB — CUP PACEART REMOTE DEVICE CHECK
Battery Remaining Longevity: 100 mo
Battery Voltage: 3.01 V
Brady Statistic AP VP Percent: 0 %
Brady Statistic AP VS Percent: 0 %
Brady Statistic AS VP Percent: 98.82 %
Brady Statistic AS VS Percent: 1.18 %
Brady Statistic RA Percent Paced: 0 %
Brady Statistic RV Percent Paced: 98.82 %
Date Time Interrogation Session: 20250804084619
Implantable Lead Connection Status: 753985
Implantable Lead Connection Status: 753985
Implantable Lead Implant Date: 20121128
Implantable Lead Implant Date: 20121128
Implantable Lead Location: 753858
Implantable Lead Location: 753860
Implantable Lead Model: 4194
Implantable Lead Model: 5076
Implantable Pulse Generator Implant Date: 20240131
Lead Channel Impedance Value: 304 Ohm
Lead Channel Impedance Value: 3363 Ohm
Lead Channel Impedance Value: 3363 Ohm
Lead Channel Impedance Value: 380 Ohm
Lead Channel Impedance Value: 418 Ohm
Lead Channel Impedance Value: 437 Ohm
Lead Channel Impedance Value: 437 Ohm
Lead Channel Impedance Value: 532 Ohm
Lead Channel Impedance Value: 665 Ohm
Lead Channel Pacing Threshold Amplitude: 0.625 V
Lead Channel Pacing Threshold Amplitude: 1.625 V
Lead Channel Pacing Threshold Pulse Width: 0.4 ms
Lead Channel Pacing Threshold Pulse Width: 0.4 ms
Lead Channel Sensing Intrinsic Amplitude: 10.75 mV
Lead Channel Sensing Intrinsic Amplitude: 4.75 mV
Lead Channel Setting Pacing Amplitude: 2 V
Lead Channel Setting Pacing Amplitude: 2.75 V
Lead Channel Setting Pacing Pulse Width: 0.4 ms
Lead Channel Setting Pacing Pulse Width: 0.4 ms
Lead Channel Setting Sensing Sensitivity: 4 mV
Zone Setting Status: 755011

## 2024-05-15 ENCOUNTER — Ambulatory Visit: Payer: Self-pay | Admitting: Internal Medicine

## 2024-05-15 ENCOUNTER — Other Ambulatory Visit: Payer: Self-pay | Admitting: Internal Medicine

## 2024-05-23 ENCOUNTER — Encounter: Payer: Self-pay | Admitting: Internal Medicine

## 2024-05-23 ENCOUNTER — Other Ambulatory Visit: Payer: Self-pay | Admitting: Internal Medicine

## 2024-05-23 ENCOUNTER — Ambulatory Visit (INDEPENDENT_AMBULATORY_CARE_PROVIDER_SITE_OTHER): Admitting: Internal Medicine

## 2024-05-23 VITALS — BP 116/74 | HR 73 | Temp 97.5°F | Resp 20 | Ht 68.0 in | Wt 172.2 lb

## 2024-05-23 DIAGNOSIS — E119 Type 2 diabetes mellitus without complications: Secondary | ICD-10-CM | POA: Diagnosis not present

## 2024-05-23 DIAGNOSIS — I10 Essential (primary) hypertension: Secondary | ICD-10-CM

## 2024-05-23 DIAGNOSIS — Z7984 Long term (current) use of oral hypoglycemic drugs: Secondary | ICD-10-CM | POA: Diagnosis not present

## 2024-05-23 DIAGNOSIS — E785 Hyperlipidemia, unspecified: Secondary | ICD-10-CM

## 2024-05-23 DIAGNOSIS — I4891 Unspecified atrial fibrillation: Secondary | ICD-10-CM | POA: Diagnosis not present

## 2024-05-23 LAB — CBC WITH DIFFERENTIAL/PLATELET
Basophils Absolute: 0 K/uL (ref 0.0–0.1)
Basophils Relative: 0.7 % (ref 0.0–3.0)
Eosinophils Absolute: 0.1 K/uL (ref 0.0–0.7)
Eosinophils Relative: 1.8 % (ref 0.0–5.0)
HCT: 41.6 % (ref 39.0–52.0)
Hemoglobin: 13.9 g/dL (ref 13.0–17.0)
Lymphocytes Relative: 27.2 % (ref 12.0–46.0)
Lymphs Abs: 1.8 K/uL (ref 0.7–4.0)
MCHC: 33.3 g/dL (ref 30.0–36.0)
MCV: 90.1 fl (ref 78.0–100.0)
Monocytes Absolute: 0.5 K/uL (ref 0.1–1.0)
Monocytes Relative: 7.5 % (ref 3.0–12.0)
Neutro Abs: 4.2 K/uL (ref 1.4–7.7)
Neutrophils Relative %: 62.8 % (ref 43.0–77.0)
Platelets: 228 K/uL (ref 150.0–400.0)
RBC: 4.62 Mil/uL (ref 4.22–5.81)
RDW: 15 % (ref 11.5–15.5)
WBC: 6.6 K/uL (ref 4.0–10.5)

## 2024-05-23 LAB — BASIC METABOLIC PANEL WITH GFR
BUN: 14 mg/dL (ref 6–23)
CO2: 33 meq/L — ABNORMAL HIGH (ref 19–32)
Calcium: 8.7 mg/dL (ref 8.4–10.5)
Chloride: 101 meq/L (ref 96–112)
Creatinine, Ser: 0.81 mg/dL (ref 0.40–1.50)
GFR: 81.67 mL/min (ref >60.00)
Glucose, Bld: 117 mg/dL — ABNORMAL HIGH (ref 70–99)
Potassium: 4.9 meq/L (ref 3.5–5.1)
Sodium: 140 meq/L (ref 135–145)

## 2024-05-23 LAB — HEMOGLOBIN A1C: Hgb A1c MFr Bld: 6.5 % (ref 4.6–6.5)

## 2024-05-23 NOTE — Assessment & Plan Note (Signed)
 DM: Last A1c very good at 6.1.  On Farxiga , recheck A1c HTN: BP today looks good, reports ambulatory BPs are okay.  Could not give me any readings.  ContinueBisoprolol, Lasix , amlodipine .  Check BMP and CBC. High cholesterol: Last LDL 76.  No change recommended.  On Lipitor. Atrial fibrillation: Saw cardiology 03/12/2024, no changes made Osteoporosis: Next Prolia  09/2024. Social:. Wife, [place]  passed away 08-06-23 Household: pt, G-daughter, her child and her husband  Still drives as off 05/2024 Vaccine advice: Had RSV.  Recommend a flu and a COVID-vaccine RTC 4 months

## 2024-05-23 NOTE — Telephone Encounter (Signed)
 Appt later today.

## 2024-05-23 NOTE — Progress Notes (Signed)
 Subjective:    Patient ID: Bradley JINNY Dalia Chrystal., male    DOB: 04-01-1941, 83 y.o.   MRN: [id number]  DOS:  05/23/2024 Type of visit - description: Routine follow-up  Reports he is feeling great and has no concerns. Denies chest pain. Denies shortness of breath (even when he is not wearing his oxygen). Minimal cough, no sputum production.  No edema. Appetite is good.   Wt Readings from Last 3 Encounters:  05/23/24 172 lb 4 oz (78.1 kg)  04/11/24 185 lb (83.9 kg)  03/12/24 172 lb (78 kg)     Review of Systems See above   Past Medical History:  Diagnosis Date   Abnormal CT scan, chest 2012   CT chest, several lymphadenopathies. Sees pulmonary   Anemia    intermittent   Arthritis    ?back (09/19/2018)   Atrial fibrillation (HCC)    s/p AV node ablation & BiV PPM implantation 09/08/11 (op dictation pending)   BPH (benign prostatic hyperplasia)    Saw Dr Amiel 2004, normal renal u/s   Bronchitis 08/26/2017   CAD (coronary artery disease)    CHF (congestive heart failure) (HCC)    Thought primarily to be non-systolic although EF down (EF 54-49% 12/2010, down to 35-40% 09/05/11), cath 2008 with no CAD, nuclear study 07/2011 showing Small area of reversibility in the distal ant/lat wall the left ventricle suspicious for ischemia/septal wall HK but felt to be low risk  (per D/C Summary 07/2011)   Chronic bronchitis (HCC)    get it ~ q yr   Chronic diastolic heart failure (HCC)         Chronic respiratory failure with hypoxia and hypercapnia (HCC) 09/14/2010   Followed in Pulmonary clinic/ [facility]/ [facility]  Started on 02 2lpm at discharge 09/23/11   - PFT's 10/28/2011  FEV1  1.40 (51%)  with ratio 70 and no better p B2 and DLCO 53 corrects to 101    - PFT's 10/18/2014    FEV1 1.71 (59%) with ratio 68 and no sign change p B2 and DLCO 53 corrects to 85  -  HC03   07/28/20  = 33  -  HCO3   03/17/21     = 31     Heart murmur    HLD (hyperlipidemia)    HTN (hypertension)    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   Pleural effusion 2008   S/p decortication   Pneumonia 08/02/2011   Pulmonary HTN (HCC)    per cath 2008   Type II diabetes mellitus (HCC) 1999    Past Surgical History:  Procedure Laterality Date   BI-VENTRICULAR PACEMAKER INSERTION Left 09/08/2011   Procedure: BI-VENTRICULAR PACEMAKER INSERTION (CRT-P);  Surgeon: Danelle LELON Birmingham, MD;  Location: [facility] CATH LAB;  Service: Cardiovascular;  Laterality: Left;   BIV PACEMAKER GENERATOR CHANGEOUT N/A 11/10/2022   Procedure: BIV PACEMAKER GENERATOR CHANGEOUT;  Surgeon: Birmingham Danelle LELON, MD;  Location: [facility] INVASIVE CV LAB;  Service: Cardiovascular;  Laterality: N/A;   CARDIAC CATHETERIZATION     couple times; never had balloon or stent (09/19/2018)   CATARACT EXTRACTION W/ INTRAOCULAR LENS  IMPLANT, BILATERAL Bilateral 08/2018   COLONOSCOPY  03/10/11   normal   INSERT / REPLACE /  REMOVE PACEMAKER  09/08/11   pacemaker placement   LUNG DECORTICATION     PLEURAL SCARIFICATION     pneumothorax with fibrothorax  ~ 2010   RIGHT/LEFT HEART CATH AND CORONARY ANGIOGRAPHY N/A 10/05/2022   Procedure: RIGHT/LEFT HEART CATH AND CORONARY ANGIOGRAPHY;  Surgeon: Dann Candyce RAMAN, MD;  Location: [facility] INVASIVE CV LAB;  Service: Cardiovascular;  Laterality: N/A;   TONSILLECTOMY     as a kid     Current Outpatient Medications  Medication Instructions   acetaminophen  (TYLENOL ) 1,000 mg, Every 8 hours PRN   albuterol  (PROVENTIL ) 2.5 mg, Nebulization, Every 6 hours PRN   amLODipine  (NORVASC ) 10 mg, Oral, Daily   aspirin  81 mg, Every morning   atorvastatin  (LIPITOR) 40 mg, Oral, Daily at bedtime   bisoprolol  (ZEBETA ) 10 mg, Oral, Daily   budesonide  (PULMICORT ) 0.5 mg, Nebulization, 2 times daily   calcium -vitamin D  (OSCAL WITH D) 500-200  MG-UNIT tablet 1 tablet, Every morning   cholecalciferol  (VITAMIN D3) 1,000 Units, Every evening   dapagliflozin  propanediol (FARXIGA ) 10 mg, Oral, Daily before breakfast   diphenhydramine -acetaminophen  (TYLENOL  PM) 25-500 MG TABS tablet 1 tablet, At bedtime PRN   furosemide  (LASIX ) 40 MG tablet TAKE 2 TABLETS EVERY MORNING AND 1 TABLET LATE IN THE AFTERNOON   glucose blood (ONETOUCH VERIO) test strip USE TO CHECK BLOOD SUGAR NO MORE THAN TWICE A DAY   Ipratropium-Albuterol  (COMBIVENT  RESPIMAT) 20-100 MCG/ACT AERS respimat 1 puff, Inhalation, Every 6 hours PRN   ipratropium-albuterol  (DUONEB) 0.5-2.5 (3) MG/3ML SOLN 3 mLs, Nebulization, Every 6 hours PRN   Lancets (ONETOUCH ULTRASOFT) lancets Check blood sugars no more than twice daily   Multiple Vitamins-Minerals (MULTIVITAMINS THER. W/MINERALS) TABS tablet 1 tablet, Oral, Daily   nitroGLYCERIN  (NITROSTAT ) 0.4 mg, Sublingual, Every 5 min x3 PRN   OXYGEN 4 L/min, Continuous   pantoprazole  (PROTONIX ) 40 mg, Oral, Daily before breakfast   tamsulosin  (FLOMAX ) 0.4 mg, Oral, Daily after supper       Objective:   Physical Exam BP 116/74   Pulse 73   Temp (!) 97.5 F (36.4 C) (Oral)   Resp 20   Ht 5' 8 (1.727 m)   Wt 172 lb 4 oz (78.1 kg)   SpO2 90% Comment: no O2 today  BMI 26.19 kg/m  General:   Well developed, NAD, BMI noted. HEENT:  Normocephalic . Face symmetric, atraumatic Lungs:  Lungs are clear today. Normal respiratory effort, no intercostal retractions, no accessory muscle use. Heart: RRR,  no murmur.  Lower extremities: no pretibial edema bilaterally  Skin: Not pale. Not jaundice Neurologic:  alert & oriented X3.  Speech normal, gait appropriate for age and unassisted Psych--  Cognition and judgment appear intact.  Cooperative with normal attention span and concentration.  Behavior appropriate. No anxious or depressed appearing.      Assessment     Problem list DM HTN Hyperlipidemia GERD Anxiety- insomnia   BPH Cardiovascular:Dr Waddell  --  Cath x 2 [id number]: no significant CAD -- CHF, non-ischemic  --Atrial fibrillation; h/o AV node ablation, has a Pacemaker (biventricular pacing) --h/o IC bleeding: Not anticoagulated --Peripheral vascular disease Pulmonary: Dr Darlean, last OV 10-18-2014, f/u prn --COPD, severe, chronic resp failure, night O2 prn, has portable O2  --Pleural effusion s/p decortication --Pulmonary hypertension  Stasis dermatitis  Osteoporosis: T-score -4.1.  Evenity not available from men.  First Prolia  shot 03/14/2023.  PLAN: DM: Last A1c very good at 6.1.  On Farxiga , recheck A1c HTN: BP today looks good, reports ambulatory BPs  are okay.  Could not give me any readings.  ContinueBisoprolol, Lasix , amlodipine .  Check BMP and CBC. High cholesterol: Last LDL 76.  No change recommended.  On Lipitor. Atrial fibrillation: Saw cardiology 03/12/2024, no changes made Osteoporosis: Next Prolia  09/2024. Social:. Wife, Bradley [name]  passed away 08-07-23 Household: pt, G-daughter, her child and her husband  Still drives as off 05/2024 Vaccine advice: Had RSV.  Recommend a flu and a COVID-vaccine RTC 4 months

## 2024-05-23 NOTE — Patient Instructions (Addendum)
 Vaccines are recommended for this fall: Flu shot COVID booster  Continue checking your blood pressure regularly Blood pressure goal:  between 110/65 and  135/85. If it is consistently higher or lower, let me know  GO TO THE LAB :  Get the blood work   Your results will be posted on MyChart with my comments  Next office visit for a checkup in 4 months Please make an appointment before you leave today

## 2024-05-25 ENCOUNTER — Ambulatory Visit: Payer: Self-pay | Admitting: Internal Medicine

## 2024-06-13 ENCOUNTER — Other Ambulatory Visit: Payer: Self-pay

## 2024-06-13 ENCOUNTER — Encounter (HOSPITAL_BASED_OUTPATIENT_CLINIC_OR_DEPARTMENT_OTHER): Payer: Self-pay | Admitting: Emergency Medicine

## 2024-06-13 ENCOUNTER — Emergency Department (HOSPITAL_BASED_OUTPATIENT_CLINIC_OR_DEPARTMENT_OTHER)

## 2024-06-13 ENCOUNTER — Inpatient Hospital Stay (HOSPITAL_BASED_OUTPATIENT_CLINIC_OR_DEPARTMENT_OTHER)
Admission: EM | Admit: 2024-06-13 | Discharge: 2024-06-21 | DRG: 190 | Disposition: A | Discharge status: Hospice | Attending: Family Medicine | Admitting: Family Medicine

## 2024-06-13 ENCOUNTER — Ambulatory Visit: Payer: Self-pay

## 2024-06-13 DIAGNOSIS — I495 Sick sinus syndrome: Secondary | ICD-10-CM | POA: Diagnosis present

## 2024-06-13 DIAGNOSIS — I4891 Unspecified atrial fibrillation: Secondary | ICD-10-CM | POA: Diagnosis not present

## 2024-06-13 DIAGNOSIS — J9611 Chronic respiratory failure with hypoxia: Secondary | ICD-10-CM

## 2024-06-13 DIAGNOSIS — Z885 Allergy status to narcotic agent status: Secondary | ICD-10-CM

## 2024-06-13 DIAGNOSIS — I10 Essential (primary) hypertension: Secondary | ICD-10-CM | POA: Diagnosis not present

## 2024-06-13 DIAGNOSIS — I110 Hypertensive heart disease with heart failure: Secondary | ICD-10-CM | POA: Diagnosis present

## 2024-06-13 DIAGNOSIS — Z7982 Long term (current) use of aspirin: Secondary | ICD-10-CM

## 2024-06-13 DIAGNOSIS — Z1152 Encounter for screening for COVID-19: Secondary | ICD-10-CM

## 2024-06-13 DIAGNOSIS — E785 Hyperlipidemia, unspecified: Secondary | ICD-10-CM | POA: Diagnosis present

## 2024-06-13 DIAGNOSIS — Z9842 Cataract extraction status, left eye: Secondary | ICD-10-CM

## 2024-06-13 DIAGNOSIS — G8929 Other chronic pain: Secondary | ICD-10-CM | POA: Diagnosis present

## 2024-06-13 DIAGNOSIS — Z8673 Personal history of transient ischemic attack (TIA), and cerebral infarction without residual deficits: Secondary | ICD-10-CM

## 2024-06-13 DIAGNOSIS — Z7951 Long term (current) use of inhaled steroids: Secondary | ICD-10-CM

## 2024-06-13 DIAGNOSIS — Z950 Presence of cardiac pacemaker: Secondary | ICD-10-CM

## 2024-06-13 DIAGNOSIS — E119 Type 2 diabetes mellitus without complications: Secondary | ICD-10-CM

## 2024-06-13 DIAGNOSIS — I272 Pulmonary hypertension, unspecified: Secondary | ICD-10-CM | POA: Diagnosis present

## 2024-06-13 DIAGNOSIS — Z66 Do not resuscitate: Secondary | ICD-10-CM | POA: Diagnosis present

## 2024-06-13 DIAGNOSIS — E1151 Type 2 diabetes mellitus with diabetic peripheral angiopathy without gangrene: Secondary | ICD-10-CM | POA: Diagnosis present

## 2024-06-13 DIAGNOSIS — R0602 Shortness of breath: Secondary | ICD-10-CM

## 2024-06-13 DIAGNOSIS — R079 Chest pain, unspecified: Secondary | ICD-10-CM | POA: Diagnosis present

## 2024-06-13 DIAGNOSIS — Z9981 Dependence on supplemental oxygen: Secondary | ICD-10-CM

## 2024-06-13 DIAGNOSIS — Z8249 Family history of ischemic heart disease and other diseases of the circulatory system: Secondary | ICD-10-CM

## 2024-06-13 DIAGNOSIS — J441 Chronic obstructive pulmonary disease with (acute) exacerbation: Secondary | ICD-10-CM | POA: Diagnosis not present

## 2024-06-13 DIAGNOSIS — I2510 Atherosclerotic heart disease of native coronary artery without angina pectoris: Secondary | ICD-10-CM

## 2024-06-13 DIAGNOSIS — Z9841 Cataract extraction status, right eye: Secondary | ICD-10-CM

## 2024-06-13 DIAGNOSIS — I4819 Other persistent atrial fibrillation: Secondary | ICD-10-CM | POA: Diagnosis present

## 2024-06-13 DIAGNOSIS — Z961 Presence of intraocular lens: Secondary | ICD-10-CM | POA: Diagnosis present

## 2024-06-13 DIAGNOSIS — Z881 Allergy status to other antibiotic agents status: Secondary | ICD-10-CM

## 2024-06-13 DIAGNOSIS — Z87891 Personal history of nicotine dependence: Secondary | ICD-10-CM

## 2024-06-13 DIAGNOSIS — N400 Enlarged prostate without lower urinary tract symptoms: Secondary | ICD-10-CM | POA: Diagnosis present

## 2024-06-13 DIAGNOSIS — I50810 Right heart failure, unspecified: Secondary | ICD-10-CM

## 2024-06-13 DIAGNOSIS — Z515 Encounter for palliative care: Secondary | ICD-10-CM

## 2024-06-13 DIAGNOSIS — E1159 Type 2 diabetes mellitus with other circulatory complications: Secondary | ICD-10-CM

## 2024-06-13 DIAGNOSIS — I5032 Chronic diastolic (congestive) heart failure: Secondary | ICD-10-CM

## 2024-06-13 DIAGNOSIS — Z7189 Other specified counseling: Secondary | ICD-10-CM

## 2024-06-13 DIAGNOSIS — J9612 Chronic respiratory failure with hypercapnia: Secondary | ICD-10-CM

## 2024-06-13 DIAGNOSIS — Z79899 Other long term (current) drug therapy: Secondary | ICD-10-CM

## 2024-06-13 DIAGNOSIS — I5033 Acute on chronic diastolic (congestive) heart failure: Secondary | ICD-10-CM | POA: Diagnosis present

## 2024-06-13 DIAGNOSIS — I5082 Biventricular heart failure: Secondary | ICD-10-CM | POA: Diagnosis present

## 2024-06-13 DIAGNOSIS — Z833 Family history of diabetes mellitus: Secondary | ICD-10-CM

## 2024-06-13 DIAGNOSIS — I2729 Other secondary pulmonary hypertension: Secondary | ICD-10-CM | POA: Diagnosis present

## 2024-06-13 LAB — CBC
HCT: 37.9 % — ABNORMAL LOW (ref 39.0–52.0)
Hemoglobin: 12.4 g/dL — ABNORMAL LOW (ref 13.0–17.0)
MCH: 29.9 pg (ref 26.0–34.0)
MCHC: 32.7 g/dL (ref 30.0–36.0)
MCV: 91.3 fL (ref 80.0–100.0)
Platelets: 279 K/uL (ref 150–400)
RBC: 4.15 MIL/uL — ABNORMAL LOW (ref 4.22–5.81)
RDW: 14.4 % (ref 11.5–15.5)
WBC: 7.5 K/uL (ref 4.0–10.5)
nRBC: 0 % (ref 0.0–0.2)

## 2024-06-13 LAB — RESP PANEL BY RT-PCR (RSV, FLU A&B, COVID)  RVPGX2
Influenza A by PCR: NEGATIVE
Influenza B by PCR: NEGATIVE
Resp Syncytial Virus by PCR: NEGATIVE
SARS Coronavirus 2 by RT PCR: NEGATIVE

## 2024-06-13 LAB — BASIC METABOLIC PANEL WITH GFR
Anion gap: 10 (ref 5–15)
BUN: 13 mg/dL (ref 8–23)
CO2: 29 mmol/L (ref 22–32)
Calcium: 8.3 mg/dL — ABNORMAL LOW (ref 8.9–10.3)
Chloride: 101 mmol/L (ref 98–111)
Creatinine, Ser: 0.91 mg/dL (ref 0.61–1.24)
GFR, Estimated: >60 mL/min (ref >60)
Glucose, Bld: 104 mg/dL — ABNORMAL HIGH (ref 70–99)
Potassium: 4.1 mmol/L (ref 3.5–5.1)
Sodium: 140 mmol/L (ref 135–145)

## 2024-06-13 LAB — GLUCOSE, CAPILLARY: Glucose-Capillary: 181 mg/dL — ABNORMAL HIGH (ref 70–99)

## 2024-06-13 MED ORDER — SODIUM CHLORIDE 0.9% FLUSH
3.0000 mL | Freq: Two times a day (BID) | INTRAVENOUS | Status: DC
  Administered 2024-06-13 – 2024-06-21 (×16): 3 mL via INTRAVENOUS

## 2024-06-13 MED ORDER — INSULIN ASPART 100 UNIT/ML IJ SOLN
0.0000 [IU] | Freq: Every day | INTRAMUSCULAR | Status: DC
  Administered 2024-06-16: 2 [IU] via SUBCUTANEOUS
  Administered 2024-06-17: 3 [IU] via SUBCUTANEOUS
  Administered 2024-06-18: 2 [IU] via SUBCUTANEOUS

## 2024-06-13 MED ORDER — ACETAMINOPHEN 325 MG PO TABS
650.0000 mg | ORAL_TABLET | Freq: Four times a day (QID) | ORAL | Status: DC | PRN
  Administered 2024-06-15 – 2024-06-20 (×4): 650 mg via ORAL
  Filled 2024-06-13 (×4): qty 2

## 2024-06-13 MED ORDER — FUROSEMIDE 40 MG PO TABS
40.0000 mg | ORAL_TABLET | Freq: Every day | ORAL | Status: DC
  Administered 2024-06-14: 40 mg via ORAL
  Filled 2024-06-13: qty 1

## 2024-06-13 MED ORDER — ASPIRIN 81 MG PO CHEW
81.0000 mg | CHEWABLE_TABLET | Freq: Every morning | ORAL | Status: DC
  Administered 2024-06-14 – 2024-06-21 (×8): 81 mg via ORAL
  Filled 2024-06-13 (×8): qty 1

## 2024-06-13 MED ORDER — PREDNISONE 20 MG PO TABS: 40.0000 mg | ORAL_TABLET | Freq: Every day | ORAL | Status: DC

## 2024-06-13 MED ORDER — FUROSEMIDE 40 MG PO TABS
80.0000 mg | ORAL_TABLET | Freq: Every day | ORAL | Status: DC
  Administered 2024-06-14 – 2024-06-16 (×3): 80 mg via ORAL
  Filled 2024-06-13 (×3): qty 2

## 2024-06-13 MED ORDER — ACETAMINOPHEN 650 MG RE SUPP: 650.0000 mg | Freq: Four times a day (QID) | RECTAL | Status: DC | PRN

## 2024-06-13 MED ORDER — BISOPROLOL FUMARATE 5 MG PO TABS
10.0000 mg | ORAL_TABLET | Freq: Every day | ORAL | Status: DC
  Administered 2024-06-14 – 2024-06-21 (×8): 10 mg via ORAL
  Filled 2024-06-13 (×8): qty 2

## 2024-06-13 MED ORDER — SENNOSIDES-DOCUSATE SODIUM 8.6-50 MG PO TABS: 1.0000 | ORAL_TABLET | Freq: Every evening | ORAL | Status: DC | PRN

## 2024-06-13 MED ORDER — TAMSULOSIN HCL 0.4 MG PO CAPS
0.4000 mg | ORAL_CAPSULE | Freq: Every day | ORAL | Status: DC
  Administered 2024-06-14 – 2024-06-20 (×7): 0.4 mg via ORAL
  Filled 2024-06-13 (×7): qty 1

## 2024-06-13 MED ORDER — AMLODIPINE BESYLATE 10 MG PO TABS
10.0000 mg | ORAL_TABLET | Freq: Every day | ORAL | Status: DC
  Administered 2024-06-14 – 2024-06-16 (×3): 10 mg via ORAL
  Filled 2024-06-13 (×3): qty 1

## 2024-06-13 MED ORDER — ATORVASTATIN CALCIUM 40 MG PO TABS
40.0000 mg | ORAL_TABLET | Freq: Every day | ORAL | Status: DC
  Administered 2024-06-13 – 2024-06-20 (×8): 40 mg via ORAL
  Filled 2024-06-13 (×8): qty 1

## 2024-06-13 MED ORDER — IPRATROPIUM-ALBUTEROL 0.5-2.5 (3) MG/3ML IN SOLN: 3.0000 mL | RESPIRATORY_TRACT | Status: DC

## 2024-06-13 MED ORDER — METHYLPREDNISOLONE SODIUM SUCC 125 MG IJ SOLR
125.0000 mg | INTRAMUSCULAR | Status: AC
  Administered 2024-06-13: 125 mg via INTRAVENOUS
  Filled 2024-06-13: qty 2

## 2024-06-13 MED ORDER — IPRATROPIUM-ALBUTEROL 0.5-2.5 (3) MG/3ML IN SOLN: 3.0000 mL | RESPIRATORY_TRACT | Status: DC | PRN

## 2024-06-13 MED ORDER — IPRATROPIUM-ALBUTEROL 0.5-2.5 (3) MG/3ML IN SOLN
RESPIRATORY_TRACT | Status: AC
  Filled 2024-06-13: qty 3

## 2024-06-13 MED ORDER — IPRATROPIUM-ALBUTEROL 0.5-2.5 (3) MG/3ML IN SOLN
RESPIRATORY_TRACT | Status: AC
  Administered 2024-06-13: 3 mL via RESPIRATORY_TRACT
  Filled 2024-06-13: qty 3

## 2024-06-13 MED ORDER — IPRATROPIUM-ALBUTEROL 0.5-2.5 (3) MG/3ML IN SOLN
3.0000 mL | Freq: Once | RESPIRATORY_TRACT | Status: AC
  Administered 2024-06-13: 3 mL via RESPIRATORY_TRACT

## 2024-06-13 MED ORDER — INSULIN ASPART 100 UNIT/ML IJ SOLN
0.0000 [IU] | Freq: Three times a day (TID) | INTRAMUSCULAR | Status: DC
  Administered 2024-06-14: 2 [IU] via SUBCUTANEOUS
  Administered 2024-06-14 – 2024-06-15 (×2): 1 [IU] via SUBCUTANEOUS
  Administered 2024-06-15: 2 [IU] via SUBCUTANEOUS
  Administered 2024-06-15: 1 [IU] via SUBCUTANEOUS
  Administered 2024-06-16: 2 [IU] via SUBCUTANEOUS
  Administered 2024-06-16: 3 [IU] via SUBCUTANEOUS
  Administered 2024-06-16: 2 [IU] via SUBCUTANEOUS
  Administered 2024-06-17 (×2): 1 [IU] via SUBCUTANEOUS
  Administered 2024-06-17: 3 [IU] via SUBCUTANEOUS
  Administered 2024-06-18: 4 [IU] via SUBCUTANEOUS
  Administered 2024-06-18 – 2024-06-19 (×3): 1 [IU] via SUBCUTANEOUS

## 2024-06-13 MED ORDER — GUAIFENESIN 100 MG/5ML PO LIQD: 5.0000 mL | ORAL | Status: DC | PRN

## 2024-06-13 MED ORDER — METHYLPREDNISOLONE SODIUM SUCC 125 MG IJ SOLR
125.0000 mg | Freq: Two times a day (BID) | INTRAMUSCULAR | Status: AC
  Administered 2024-06-14 – 2024-06-18 (×10): 125 mg via INTRAVENOUS
  Filled 2024-06-13 (×10): qty 2

## 2024-06-13 MED ORDER — SODIUM CHLORIDE 0.9 % IV SOLN
1.0000 g | INTRAVENOUS | Status: AC
  Administered 2024-06-13 – 2024-06-17 (×5): 1 g via INTRAVENOUS
  Filled 2024-06-13 (×5): qty 10

## 2024-06-13 MED ORDER — IOHEXOL 350 MG/ML SOLN
80.0000 mL | Freq: Once | INTRAVENOUS | Status: AC | PRN
  Administered 2024-06-13: 80 mL via INTRAVENOUS

## 2024-06-13 MED ORDER — ENOXAPARIN SODIUM 40 MG/0.4ML IJ SOSY
40.0000 mg | PREFILLED_SYRINGE | INTRAMUSCULAR | Status: DC
  Administered 2024-06-14 – 2024-06-21 (×8): 40 mg via SUBCUTANEOUS
  Filled 2024-06-13 (×8): qty 0.4

## 2024-06-13 MED ORDER — PANTOPRAZOLE SODIUM 40 MG PO TBEC
40.0000 mg | DELAYED_RELEASE_TABLET | Freq: Every day | ORAL | Status: DC
  Administered 2024-06-14 – 2024-06-21 (×8): 40 mg via ORAL
  Filled 2024-06-13 (×8): qty 1

## 2024-06-13 NOTE — H&P (Signed)
 History and Physical    Bradley [name] FMW:[id number] DOB: 09/26/1941 DOA: 06/13/2024  PCP: Amon Aloysius BRAVO, MD   Patient coming from: Home   Chief Complaint: SOB, cough  HPI: Bradley JINNY Dalia Sr. is a 83 y.o. male with medical history significant for hypertension, type 2 diabetes mellitus, PAD, CAD, atrial fibrillation status post AV node ablation, not anticoagulated due to history of ICH, tachybradycardia syndrome with pacemaker, chronic HFpEF, COPD, and chronic hypoxic and hypercarbic respiratory failure who presents with cough and shortness of breath.  Patient reports worsening shortness of breath over the past few days, particularly with activity.  He has increased cough associated with this.  He denies any chest pain, fever, chills, or lower extremity swelling.  [facility] ED Course: Upon arrival to the ED, patient is found to be afebrile and saturating upper 70s to upper 90s on 5 L/min supplemental oxygen with tachypnea, normal HR, and stable BP.  Labs are most notable for normal creatinine, normal WBC, and negative COVID-19, influenza, and RSV PCR.  CTA chest is negative for PE and notable for chronic mosaic attenuation of the bilateral lungs.  Patient was treated in the ED with 125 mg IV Solu-Medrol  and DuoNeb x 2.  He was transferred to [facility] for admission.  Review of Systems:  All other systems reviewed and apart from HPI, are negative.  Past Medical History:  Diagnosis Date   Abnormal CT scan, chest 2012   CT chest, several lymphadenopathies. Sees pulmonary   Anemia    intermittent   Arthritis    ?back (09/19/2018)   Atrial fibrillation (HCC)    s/p AV node ablation & BiV PPM implantation 09/08/11 (op dictation pending)   BPH (benign prostatic hyperplasia)    Saw Dr Amiel 2004, normal renal u/s   Bronchitis 08/26/2017   CAD (coronary artery disease)    CHF (congestive heart failure) (HCC)    Thought primarily to be non-systolic although EF down (EF 54-49%  12/2010, down to 35-40% 09/05/11), cath 2008 with no CAD, nuclear study 07/2011 showing Small area of reversibility in the distal ant/lat wall the left ventricle suspicious for ischemia/septal wall HK but felt to be low risk  (per D/C Summary 07/2011)   Chronic bronchitis (HCC)    get it ~ q yr   Chronic diastolic heart failure (HCC)         Chronic respiratory failure with hypoxia and hypercapnia (HCC) 09/14/2010   Followed in Pulmonary clinic/ [facility]/ [facility]  Started on 02 2lpm at discharge 09/23/11   - PFT's 10/28/2011  FEV1  1.40 (51%)  with ratio 70 and no better p B2 and DLCO 53 corrects to 101    - PFT's 10/18/2014    FEV1 1.71 (59%) with ratio 68 and no sign change p B2 and DLCO 53 corrects to 85  -  HC03   07/28/20  = 33  -  HCO3   03/17/21     = 31     Heart murmur    HLD (hyperlipidemia)    HTN (hypertension)    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   Pleural effusion 2008   S/p decortication   Pneumonia 08/02/2011   Pulmonary HTN (HCC)  per cath 2008   Type II diabetes mellitus (HCC) 1999    Past Surgical History:  Procedure Laterality Date   BI-VENTRICULAR PACEMAKER INSERTION Left 09/08/2011   Procedure: BI-VENTRICULAR PACEMAKER INSERTION (CRT-P);  Surgeon: Danelle LELON Birmingham, MD;  Location: [facility] CATH LAB;  Service: Cardiovascular;  Laterality: Left;   BIV PACEMAKER GENERATOR CHANGEOUT N/A 11/10/2022   Procedure: BIV PACEMAKER GENERATOR CHANGEOUT;  Surgeon: Birmingham Danelle LELON, MD;  Location: [facility] INVASIVE CV LAB;  Service: Cardiovascular;  Laterality: N/A;   CARDIAC CATHETERIZATION     couple times; never had balloon or stent (09/19/2018)   CATARACT EXTRACTION W/ INTRAOCULAR LENS  IMPLANT, BILATERAL Bilateral 08/2018   COLONOSCOPY  03/10/11   normal   INSERT / REPLACE / REMOVE PACEMAKER   09/08/11   pacemaker placement   LUNG DECORTICATION     PLEURAL SCARIFICATION     pneumothorax with fibrothorax  ~ 2010   RIGHT/LEFT HEART CATH AND CORONARY ANGIOGRAPHY N/A 10/05/2022   Procedure: RIGHT/LEFT HEART CATH AND CORONARY ANGIOGRAPHY;  Surgeon: Dann Candyce RAMAN, MD;  Location: [facility] INVASIVE CV LAB;  Service: Cardiovascular;  Laterality: N/A;   TONSILLECTOMY     as a kid     Social History:   reports that he quit smoking about 43 years ago. His smoking use included cigarettes and cigars. He started smoking about 68 years ago. He has a 62.5 pack-year smoking history. He has never used smokeless tobacco. He reports that he does not currently use alcohol. He reports that he does not use drugs.  Allergies  Allergen Reactions   Hydrocodone  Other (See Comments)    given to him in the hospital; went thru withdrawals once home; dr said not to take it again (09/27/2012) dizziness   Ultram  [Tramadol ] Other (See Comments)    given to him in the hospital; went thru withdrawals once home; dr said not to take it again (09/27/2012) dizziness   Cialis [Tadalafil] Other (See Comments)    Headache Backache  Dizziness    Avelox  [Moxifloxacin  Hydrochloride] Itching and Other (See Comments)    Headache Dizziness    Family History  Problem Relation Age of Onset   Diabetes Father    Coronary artery disease Father    Polycythemia Mother    Drug abuse Son    Breast cancer Maternal Aunt    Prostate cancer Neg Hx    Colon cancer Neg Hx      Prior to Admission medications   Medication Sig Start Date End Date Taking? Authorizing Provider  acetaminophen  (TYLENOL ) 500 MG tablet Take 1,000 mg by mouth every 8 (eight) hours as needed for headache or moderate pain.    [provider]  albuterol  (PROVENTIL ) (2.5 MG/3ML) 0.083% nebulizer solution Take 3 mLs (2.5 mg total) by nebulization every 6 (six) hours as needed for wheezing or shortness of breath. 10/11/23   Amon Aloysius BRAVO, MD   amLODipine  (NORVASC ) 10 MG tablet Take 1 tablet (10 mg total) by mouth daily. 05/23/24   Paz, Jose E, MD  aspirin  81 MG tablet Take 81 mg by mouth every morning.     [provider]  atorvastatin  (LIPITOR) 40 MG tablet TAKE 1 TABLET AT BEDTIME 11/03/23   Raford Riggs, MD  bisoprolol  (ZEBETA ) 10 MG tablet Take 1 tablet (10 mg total) by mouth daily. 05/01/24   Amon Aloysius BRAVO, MD  budesonide  (PULMICORT ) 0.5 MG/2ML nebulizer solution Take 2 mLs (0.5 mg total) by nebulization 2 (two) times daily. 10/10/23   Amon Aloysius  E, MD  calcium -vitamin D  (OSCAL WITH D) 500-200 MG-UNIT tablet Take 1 tablet by mouth every morning.    [provider]  cholecalciferol  (VITAMIN D3) 25 MCG (1000 UNIT) tablet Take 1,000 Units by mouth every evening.    [provider]  dapagliflozin  propanediol (FARXIGA ) 10 MG TABS tablet Take 1 tablet (10 mg total) by mouth daily before breakfast. 03/06/24   Paz, Jose E, MD  diphenhydramine -acetaminophen  (TYLENOL  PM) 25-500 MG TABS tablet Take 1 tablet by mouth at bedtime as needed (sleep).    [provider]  furosemide  (LASIX ) 40 MG tablet TAKE 2 TABLETS EVERY MORNING AND 1 TABLET LATE IN THE AFTERNOON 08/19/23   Raford Riggs, MD  glucose blood (ONETOUCH VERIO) test strip USE TO CHECK BLOOD SUGAR NO MORE THAN TWICE A DAY 03/28/23   Amon Aloysius BRAVO, MD  Ipratropium-Albuterol  (COMBIVENT  RESPIMAT) 20-100 MCG/ACT AERS respimat Inhale 1 puff into the lungs every 6 (six) hours as needed for wheezing or shortness of breath. 10/11/23   Paz, Jose E, MD  ipratropium-albuterol  (DUONEB) 0.5-2.5 (3) MG/3ML SOLN Take 3 mLs by nebulization every 6 (six) hours as needed (for shortness of breath). 10/11/23   Amon Aloysius BRAVO, MD  Lancets [facility] ULTRASOFT) lancets Check blood sugars no more than twice daily 03/08/17   Paz, Jose E, MD  Multiple Vitamins-Minerals (MULTIVITAMINS THER. W/MINERALS) TABS tablet Take 1 tablet by mouth daily. 09/14/13   Amon Aloysius BRAVO, MD  nitroGLYCERIN   (NITROSTAT ) 0.4 MG SL tablet Place 1 tablet (0.4 mg total) under the tongue every 5 (five) minutes x 3 doses as needed for chest pain. 09/07/21   Paz, Jose E, MD  OXYGEN Inhale 4 L/min into the lungs continuous. Patient not taking: Reported on 05/23/2024    [provider]  pantoprazole  (PROTONIX ) 40 MG tablet Take 1 tablet (40 mg total) by mouth daily before breakfast. 05/23/24   Paz, Jose E, MD  tamsulosin  (FLOMAX ) 0.4 MG CAPS capsule Take 1 capsule (0.4 mg total) by mouth daily after supper. 05/15/24   Amon Aloysius BRAVO, MD    Physical Exam: Vitals:   06/13/24 2000 06/13/24 2030 06/13/24 2100 06/13/24 2115  BP: 118/71 114/75 122/78   Pulse: 71 69 70 71  Resp: 16 (!) 31 (!) 21 (!) 27  Temp:      SpO2: 98% 95% 95% 95%    Constitutional: NAD, no pallor or diaphoresis   Eyes: PERTLA, lids and conjunctivae normal ENMT: Mucous membranes are moist. Posterior pharynx clear of any exudate or lesions.   Neck: supple, no masses  Respiratory: Dyspneic with speech. Diminished bilaterally with prolonged expiratory phase.  Cardiovascular: S1 & S2 heard, regular rate and rhythm. No extremity edema.   Abdomen: No distension, no tenderness, soft. Bowel sounds active.  Musculoskeletal: no clubbing / cyanosis. No joint deformity upper and lower extremities.   Skin: B/l lower leg hyperpigmentation in gaiter distribution. SKin is otherwise warm, dry, well-perfused. Neurologic: CN 2-12 grossly intact. Moving all extremities. Alert and oriented.  Psychiatric: Calm. Cooperative.    Labs and Imaging on Admission: I have personally reviewed following labs and imaging studies  CBC: Recent Labs  Lab 06/13/24 1624  WBC 7.5  HGB 12.4*  HCT 37.9*  MCV 91.3  PLT 279   Basic Metabolic Panel: Recent Labs  Lab 06/13/24 1624  NA 140  K 4.1  CL 101  CO2 29  GLUCOSE 104*  BUN 13  CREATININE 0.91  CALCIUM  8.3*   GFR: CrCl cannot be calculated (  Unknown ideal weight.). Liver Function Tests: No  results for input(s): AST, ALT, ALKPHOS, BILITOT, PROT, ALBUMIN in the last 168 hours. No results for input(s): LIPASE, AMYLASE in the last 168 hours. No results for input(s): AMMONIA in the last 168 hours. Coagulation Profile: No results for input(s): INR, PROTIME in the last 168 hours. Cardiac Enzymes: No results for input(s): CKTOTAL, CKMB, CKMBINDEX, TROPONINI in the last 168 hours. BNP (last 3 results) No results for input(s): PROBNP in the last 8760 hours. HbA1C: No results for input(s): HGBA1C in the last 72 hours. CBG: No results for input(s): GLUCAP in the last 168 hours. Lipid Profile: No results for input(s): CHOL, HDL, LDLCALC, TRIG, CHOLHDL, LDLDIRECT in the last 72 hours. Thyroid  Function Tests: No results for input(s): TSH, T4TOTAL, FREET4, T3FREE, THYROIDAB in the last 72 hours. Anemia Panel: No results for input(s): VITAMINB12, FOLATE, FERRITIN, TIBC, IRON, RETICCTPCT in the last 72 hours. Urine analysis:    Component Value Date/Time   COLORURINE AMBER (A) 07/04/2023 1749   APPEARANCEUR HAZY (A) 07/04/2023 1749   LABSPEC 1.016 07/04/2023 1749   PHURINE 5.0 07/04/2023 1749   GLUCOSEU >=500 (A) 07/04/2023 1749   GLUCOSEU NEGATIVE 07/15/2015 1440   HGBUR NEGATIVE 07/04/2023 1749   BILIRUBINUR NEGATIVE 07/04/2023 1749   KETONESUR NEGATIVE 07/04/2023 1749   PROTEINUR NEGATIVE 07/04/2023 1749   UROBILINOGEN 1.0 07/15/2015 1440   NITRITE NEGATIVE 07/04/2023 1749   LEUKOCYTESUR NEGATIVE 07/04/2023 1749   Sepsis Labs: @LABRCNTIP (procalcitonin:4,lacticidven:4) ) Recent Results (from the past 240 hours)  Resp panel by RT-PCR (RSV, Flu A&B, Covid) Anterior Nasal Swab     Status: None   Collection Time: 06/13/24  5:15 PM   Specimen: Anterior Nasal Swab  Result Value Ref Range Status   SARS Coronavirus 2 by RT PCR NEGATIVE NEGATIVE Final    Comment: (NOTE) SARS-CoV-2 target nucleic acids are NOT  DETECTED.  The SARS-CoV-2 RNA is generally detectable in upper respiratory specimens during the acute phase of infection. The lowest concentration of SARS-CoV-2 viral copies this assay can detect is 138 copies/mL. A negative result does not preclude SARS-Cov-2 infection and should not be used as the sole basis for treatment or other patient management decisions. A negative result may occur with  improper specimen collection/handling, submission of specimen other than nasopharyngeal swab, presence of viral mutation(s) within the areas targeted by this assay, and inadequate number of viral copies(<138 copies/mL). A negative result must be combined with clinical observations, patient history, and epidemiological information. The expected result is Negative.  Fact Sheet for Patients:  BloggerCourse.com  Fact Sheet for Healthcare Providers:  SeriousBroker.it  This test is no t yet approved or cleared by the [place]  FDA and  has been authorized for detection and/or diagnosis of SARS-CoV-2 by FDA under an Emergency Use Authorization (EUA). This EUA will remain  in effect (meaning this test can be used) for the duration of the COVID-19 declaration under Section 564(b)(1) of the Act, 21 U.S.C.section 360bbb-3(b)(1), unless the authorization is terminated  or revoked sooner.       Influenza A by PCR NEGATIVE NEGATIVE Final   Influenza B by PCR NEGATIVE NEGATIVE Final    Comment: (NOTE) The Xpert Xpress SARS-CoV-2/FLU/RSV plus assay is intended as an aid in the diagnosis of influenza from Nasopharyngeal swab specimens and should not be used as a sole basis for treatment. Nasal washings and aspirates are unacceptable for Xpert Xpress SARS-CoV-2/FLU/RSV testing.  Fact Sheet for Patients: BloggerCourse.com  Fact Sheet for Healthcare Providers: SeriousBroker.it  This test is not yet  approved or cleared by the [place]  FDA and has been authorized for detection and/or diagnosis of SARS-CoV-2 by FDA under an Emergency Use Authorization (EUA). This EUA will remain in effect (meaning this test can be used) for the duration of the COVID-19 declaration under Section 564(b)(1) of the Act, 21 U.S.C. section 360bbb-3(b)(1), unless the authorization is terminated or revoked.     Resp Syncytial Virus by PCR NEGATIVE NEGATIVE Final    Comment: (NOTE) Fact Sheet for Patients: BloggerCourse.com  Fact Sheet for Healthcare Providers: SeriousBroker.it  This test is not yet approved or cleared by the [place]  FDA and has been authorized for detection and/or diagnosis of SARS-CoV-2 by FDA under an Emergency Use Authorization (EUA). This EUA will remain in effect (meaning this test can be used) for the duration of the COVID-19 declaration under Section 564(b)(1) of the Act, 21 U.S.C. section 360bbb-3(b)(1), unless the authorization is terminated or revoked.  Performed at [facility], [address] [place], [place] [address]      Radiological Exams on Admission: CT Angio Chest PE W/Cm &/Or Wo Cm Result Date: 06/13/2024 CLINICAL DATA:  Home oxygen therapy. COPD. Shortness of breath. Cough. Hypoxia. EXAM: CT ANGIOGRAPHY CHEST WITH CONTRAST TECHNIQUE: Multidetector CT imaging of the chest was performed using the standard protocol during bolus administration of intravenous contrast. Multiplanar CT image reconstructions and MIPs were obtained to evaluate the vascular anatomy. RADIATION DOSE REDUCTION: This exam was performed according to the departmental dose-optimization program which includes automated exposure control, adjustment of the mA and/or kV according to patient size and/or use of iterative reconstruction technique. CONTRAST:  80mL OMNIPAQUE  IOHEXOL  350 MG/ML SOLN COMPARISON:  05/26/2023 FINDINGS: Despite  efforts by the technologist and patient, motion artifact is present on today's exam and could not be eliminated. This reduces exam sensitivity and specificity. Cardiovascular: No filling defect is identified in the pulmonary arterial tree to suggest pulmonary embolus. Coronary, aortic arch, and branch vessel atherosclerotic vascular disease. Moderate to prominent cardiomegaly especially involving the right atrium. Pacer noted. Mediastinum/Nodes: Borderline enlarged AP window lymph node 1.0 cm in short axis on image 46 series 5, previously 0.9 cm. Right lower paratracheal node mildly enlarged 1.2 cm in short axis on image 41 series 5, formerly 1.0 cm. Similar mildly prominent subcarinal lymph node. Lungs/Pleura: Chronic mosaic attenuation in the lungs. Scarring in the right middle lobe and along the minor fissure. Calcified pleural plaque along the diaphragm with pleural thickening laterally along the right lower lobe and right middle lobe similar to previous. Chronic volume loss and atelectasis in the lingula. Chronic lateral pleural thickening at the left lung base. Upper Abdomen: Abdominal aortic atherosclerosis. Musculoskeletal: Old rib deformities compatible with prior fractures. Suspected late phase healing fracture the right anterior sixth rib on image 84 series 13. Thoracic spondylosis. Review of the MIP images confirms the above findings. IMPRESSION: 1. No filling defect is identified in the pulmonary arterial tree to suggest pulmonary embolus. 2. Chronic mosaic attenuation in the lungs, possibly from small airways disease. 3. Chronic pleural thickening and calcified pleural plaque along the right diaphragm. 4. Moderate to prominent cardiomegaly especially involving the right atrium. 5. Mildly enlarged mediastinal lymph nodes, nonspecific. 6. Suspected late phase healing fracture of the right anterior sixth rib. 7.  Aortic Atherosclerosis (ICD10-I70.0). Electronically Signed   By: Ryan Salvage M.D.    On: 06/13/2024 18:38   DG Chest Portable 1 View Result Date: 06/13/2024 CLINICAL DATA:  shortness of breath EXAM: PORTABLE CHEST - 1 VIEW COMPARISON:  October 03, 2023 FINDINGS: Bilateral perihilar interstitial opacities. No lobar consolidation or pneumothorax. Small right pleural effusion. Possible trace left pleural effusion. Mild cardiomegaly. Left chest pacemaker with leads terminating in the right ventricle. Tortuous aorta with aortic atherosclerosis. No acute fracture or destructive lesions. Multilevel thoracic osteophytosis. IMPRESSION: Bilateral perihilar interstitial opacities, which may represent interstitial edema or atypical/viral infection, in the correct clinical context. Electronically Signed   By: Rogelia Myers M.D.   On: 06/13/2024 17:10    EKG: Independently reviewed. Paced.   Assessment/Plan   1. COPD exacerbation; chronic hypoxic & hypercarbic respiratory failure  - Culture sputum, continue systemic steroid and short-acting bronchodilators, start antibiotic, continue supplemental O2    2. Chronic HFpEF  - Appears compensated  - Continue Lasix , monitor volume status   3. Atrial fibrillation  - S/p AV nodal ablation; has not been anticoagulated d/t hx of ICH  - Continue bisoprolol     4. CAD  - No recent angina  - Continue ASA and Lipitor    5. Hypertension  - Norvasc     6. Type II DM  - A1c was 6.5% in August 2025  - Check CBGs and use low-intensity SSI for now    DVT prophylaxis: Lovenox   Code Status: Full  Level of Care: Level of care: Progressive Family Communication: None present   Disposition Plan:  Patient is from: Home  Anticipated d/c is to: home  Anticipated d/c date is: Possibly as early as 06/14/24  Patient currently: pending improved respiratory status  Consults called: None  Admission status: Observation     Evalene GORMAN Sprinkles, MD [facility]  06/13/2024, 10:57 PM

## 2024-06-13 NOTE — Plan of Care (Signed)
  Problem: Education: Goal: Knowledge of General Education information will improve Description: Including pain rating scale, medication(s)/side effects and non-pharmacologic comfort measures Outcome: Progressing   Problem: Clinical Measurements: Goal: Ability to maintain clinical measurements within normal limits will improve Outcome: Progressing   Problem: Coping: Goal: Level of anxiety will decrease Outcome: Progressing   

## 2024-06-13 NOTE — Plan of Care (Signed)
 Plan of Care Note for accepted transfer   Patient name: Bradley [name] FMW:[id number] DOB: May 18, 1941  Facility requesting transfer: [facility] ED Requesting Provider: Dr. Zackowski Reason for transfer: Acute COPD exacerbation Facility course: 83 year old male with history of COPD, chronic hypoxemic hypercapnic respiratory failure on 5 L home oxygen, HFpEF, hypertension, type 2 diabetes, CAD, uncontrolled atrial arrhythmia status post AV node ablation and PPM presented with shortness of breath, cough, and wheezing.  No significant change in oxygen requirement from baseline.  No leukocytosis, COVID/influenza/RSV PCR negative.  CTA chest showing: IMPRESSION: 1. No filling defect is identified in the pulmonary arterial tree to suggest pulmonary embolus. 2. Chronic mosaic attenuation in the lungs, possibly from small airways disease. 3. Chronic pleural thickening and calcified pleural plaque along the right diaphragm. 4. Moderate to prominent cardiomegaly especially involving the right atrium. 5. Mildly enlarged mediastinal lymph nodes, nonspecific. 6. Suspected late phase healing fracture of the right anterior sixth rib. 7.  Aortic Atherosclerosis (ICD10-I70.0).  Patient was given DuoNeb and Solu-Medrol .  Still wheezing.  Plan of care: The patient is accepted for admission to Progressive unit at [facility].  [facility] will assume care on arrival to accepting facility. Until arrival, care as per EDP. However, [facility] available 24/7 for questions and assistance.  Check [email or web address] for on-call coverage.  Nursing staff, please call [facility] Admits & Consults System-Wide number under Amion on patient's arrival so appropriate admitting provider can evaluate the pt.

## 2024-06-13 NOTE — ED Notes (Signed)
 Report called to [facility] and given to Sakara

## 2024-06-13 NOTE — Telephone Encounter (Signed)
FYI. Pt going to ED.  

## 2024-06-13 NOTE — ED Notes (Signed)
 Initial contact made. Pt is resting in bed and verbalizes no needs at this time. Pt states he wears 4-5L of O2 at home. Currently saturating at 95% on 4L

## 2024-06-13 NOTE — ED Provider Notes (Addendum)
 [place] [facility] Provider Note   CSN: [id number] Arrival date & time: 06/13/24  1610     Patient presents with: Shortness of Breath   Bradley SCHROM Sr. is a 83 y.o. male.   In a patient with known COPD.  Last seen in December 2024 for COPD exacerbation did not require admission.  Patient has inhalers at home.  Is not currently on steroids.  Normally on 5 L of oxygen.  Triage said that he been short of breath for 2 weeks new cough however he states that really was just a couple days ago that it really got bad.  They recorded his oxygen sats 87% in triage.  Currently is in the upper 90s on 5 L.  As stated patient on oxygen all the time.  Past medical history known for hypertension hyperlipidemia pleural effusion atrial fibrillation with patient currently has a pacemaker and is ventricular paced.  Rhythm is entirely paced.  Pulmonary hypertension peripheral vascular disease history of intracranial bleed in 2013 congestive heart failure type 2 diabetes 99.  Patient is a former smoker quit in 1982.       Prior to Admission medications   Medication Sig Start Date End Date Taking? Authorizing Provider  acetaminophen  (TYLENOL ) 500 MG tablet Take 1,000 mg by mouth every 8 (eight) hours as needed for headache or moderate pain.    [provider]  albuterol  (PROVENTIL ) (2.5 MG/3ML) 0.083% nebulizer solution Take 3 mLs (2.5 mg total) by nebulization every 6 (six) hours as needed for wheezing or shortness of breath. 10/11/23   Amon Aloysius BRAVO, MD  amLODipine  (NORVASC ) 10 MG tablet Take 1 tablet (10 mg total) by mouth daily. 05/23/24   Paz, Jose E, MD  aspirin  81 MG tablet Take 81 mg by mouth every morning.     [provider]  atorvastatin  (LIPITOR) 40 MG tablet TAKE 1 TABLET AT BEDTIME 11/03/23   Raford Riggs, MD  bisoprolol  (ZEBETA ) 10 MG tablet Take 1 tablet (10 mg total) by mouth daily. 05/01/24   Amon Aloysius BRAVO, MD  budesonide  (PULMICORT ) 0.5  MG/2ML nebulizer solution Take 2 mLs (0.5 mg total) by nebulization 2 (two) times daily. 10/10/23   Paz, Jose E, MD  calcium -vitamin D  (OSCAL WITH D) 500-200 MG-UNIT tablet Take 1 tablet by mouth every morning.    [provider]  cholecalciferol  (VITAMIN D3) 25 MCG (1000 UNIT) tablet Take 1,000 Units by mouth every evening.    [provider]  dapagliflozin  propanediol (FARXIGA ) 10 MG TABS tablet Take 1 tablet (10 mg total) by mouth daily before breakfast. 03/06/24   Paz, Jose E, MD  diphenhydramine -acetaminophen  (TYLENOL  PM) 25-500 MG TABS tablet Take 1 tablet by mouth at bedtime as needed (sleep).    [provider]  furosemide  (LASIX ) 40 MG tablet TAKE 2 TABLETS EVERY MORNING AND 1 TABLET LATE IN THE AFTERNOON 08/19/23   Raford Riggs, MD  glucose blood (ONETOUCH VERIO) test strip USE TO CHECK BLOOD SUGAR NO MORE THAN TWICE A DAY 03/28/23   Amon Aloysius BRAVO, MD  Ipratropium-Albuterol  (COMBIVENT  RESPIMAT) 20-100 MCG/ACT AERS respimat Inhale 1 puff into the lungs every 6 (six) hours as needed for wheezing or shortness of breath. 10/11/23   Paz, Jose E, MD  ipratropium-albuterol  (DUONEB) 0.5-2.5 (3) MG/3ML SOLN Take 3 mLs by nebulization every 6 (six) hours as needed (for shortness of breath). 10/11/23   Amon Aloysius BRAVO, MD  Lancets [facility] ULTRASOFT) lancets Check blood sugars no  more than twice daily 03/08/17   Paz, Jose E, MD  Multiple Vitamins-Minerals (MULTIVITAMINS THER. W/MINERALS) TABS tablet Take 1 tablet by mouth daily. 09/14/13   Amon Aloysius BRAVO, MD  nitroGLYCERIN  (NITROSTAT ) 0.4 MG SL tablet Place 1 tablet (0.4 mg total) under the tongue every 5 (five) minutes x 3 doses as needed for chest pain. 09/07/21   Paz, Jose E, MD  OXYGEN Inhale 4 L/min into the lungs continuous. Patient not taking: Reported on 05/23/2024    [provider]  pantoprazole  (PROTONIX ) 40 MG tablet Take 1 tablet (40 mg total) by mouth daily before breakfast. 05/23/24   Amon Aloysius BRAVO, MD  tamsulosin   (FLOMAX ) 0.4 MG CAPS capsule Take 1 capsule (0.4 mg total) by mouth daily after supper. 05/15/24   Amon Aloysius BRAVO, MD    Allergies: Hydrocodone , Ultram  [tramadol ], Cialis [tadalafil], and Avelox  [moxifloxacin  hydrochloride]    Review of Systems  Constitutional:  Negative for chills and fever.  HENT:  Negative for ear pain and sore throat.   Eyes:  Negative for pain and visual disturbance.  Respiratory:  Positive for shortness of breath and wheezing. Negative for cough.   Cardiovascular:  Negative for chest pain and palpitations.  Gastrointestinal:  Negative for abdominal pain and vomiting.  Genitourinary:  Negative for dysuria and hematuria.  Musculoskeletal:  Negative for arthralgias and back pain.  Skin:  Negative for color change and rash.  Neurological:  Negative for seizures and syncope.  All other systems reviewed and are negative.   Updated Vital Signs BP 117/75   Pulse 73   Temp (!) 97.1 F (36.2 C)   Resp (!) 25   SpO2 100%   Physical Exam Vitals and nursing note reviewed.  Constitutional:      General: He is not in acute distress.    Appearance: Normal appearance. He is well-developed. He is not ill-appearing, toxic-appearing or diaphoretic.  HENT:     Head: Normocephalic and atraumatic.  Eyes:     Extraocular Movements: Extraocular movements intact.     Conjunctiva/sclera: Conjunctivae normal.     Pupils: Pupils are equal, round, and reactive to light.  Cardiovascular:     Rate and Rhythm: Normal rate and regular rhythm.     Heart sounds: No murmur heard. Pulmonary:     Effort: Respiratory distress present.     Breath sounds: Wheezing present.  Abdominal:     Palpations: Abdomen is soft.     Tenderness: There is no abdominal tenderness.  Musculoskeletal:        General: No swelling.     Cervical back: Normal range of motion and neck supple.  Skin:    General: Skin is warm and dry.     Capillary Refill: Capillary refill takes less than 2 seconds.   Neurological:     General: No focal deficit present.     Mental Status: He is alert and oriented to person, place, and time.  Psychiatric:        Mood and Affect: Mood normal.     (all labs ordered are listed, but only abnormal results are displayed) Labs Reviewed  CBC - Abnormal; Notable for the following components:      Result Value   RBC 4.15 (*)    Hemoglobin 12.4 (*)    HCT 37.9 (*)    All other components within normal limits  BASIC METABOLIC PANEL WITH GFR    EKG: EKG Interpretation Date/Time:  Wednesday June 13 2024 16:25:13 EDT Ventricular Rate:  75 PR Interval:  159 QRS Duration:  141 QT Interval:  441 QTC Calculation: 493 R Axis:   183  Text Interpretation: VENTRICULAR PACED RHYTHM Nonspecific intraventricular conduction delay Abnormal lateral Q waves Confirmed by Marquita Lias [phone]) on 06/13/2024 4:34:54 PM  Radiology: No results found.   Procedures   Medications Ordered in the ED  methylPREDNISolone  sodium succinate (SOLU-MEDROL ) 125 mg/2 mL injection 125 mg (has no administration in time range)  ipratropium-albuterol  (DUONEB) 0.5-2.5 (3) MG/3ML nebulizer solution 3 mL (3 mLs Nebulization Given 06/13/24 1633)  ipratropium-albuterol  (DUONEB) 0.5-2.5 (3) MG/3ML nebulizer solution (  Given by Other 06/13/24 1640)                                    Medical Decision Making Amount and/or Complexity of Data Reviewed Labs: ordered. Radiology: ordered.  Risk Prescription drug management. Decision regarding hospitalization.   Clinically patient looks fairly well.  Patient receiving continuous neb.  Will give him Solu-Medrol .  Get portable chest x-ray and labs and will follow closely.  In the past patient has not required admission.  Most likely the COPD exacerbation.  Patient was initially doing well after nebulizer treatment.  Remain give another albuterol  Atrovent  treatment because his sats were decreasing.  This is a little bit concerning may  warrant the need for admission.  Respiratory panel negative basic metabolic panel very normal including renal function.  CBC white count 7.5 hemoglobin 12.4 platelets 279.  Chest x-ray raise some concerns about perihilar opacities which may represent a stitch or edema or an atypical viral infection.  Also because of the hypoxia went on to CT angio.  CT angio no filling defect identified in the pulmonary tree to suggest pulmonary embolus.  Chronic mosaic attenuation of the lungs possibly from small airway disease chronic pleural thickening with calcified pleural plaque along the right diaphragm moderate to prominent cardiomegaly especially involving the right atrium.  Mildly enlarged mediastinal lymph nodes nonspecific suspect late phase healing fracture of the right anterior sixth rib.  And aortic atherosclerosis.  All this goes along with my clinical impression that this is probably an exacerbation of COPD.  Will recheck him after the second albuterol  Atrovent  neb.  Patient improved after second neb but still wheezing.  He feels like he needs admission.  Oxygen sats have gotten better again after they dropped.  Will go ahead and make arrangements for admission for COPD exacerbation.  CRITICAL CARE Performed by: Rahshawn Remo Total critical care time: 45 minutes Critical care time was exclusive of separately billable procedures and treating other patients. Critical care was necessary to treat or prevent imminent or life-threatening deterioration. Critical care was time spent personally by me on the following activities: development of treatment plan with patient and/or surrogate as well as nursing, discussions with consultants, evaluation of patient's response to treatment, examination of patient, obtaining history from patient or surrogate, ordering and performing treatments and interventions, ordering and review of laboratory studies, ordering and review of radiographic studies, pulse oximetry  and re-evaluation of patient's condition.   Final diagnoses:  COPD exacerbation [facility])    ED Discharge Orders     None          Geraldene Hamilton, MD 06/13/24 1645    Geraldene Hamilton, MD 06/13/24 ALVIE    Geraldene Hamilton, MD 06/13/24 1925

## 2024-06-13 NOTE — ED Notes (Signed)
 Pt. States that he wears 5L O2 at home 24/7.  He has a PRN rescue Albuterol  inhaler.  States that he hasn't taken it in weeks, and that he does not take any other maintenance inhalers for his COPD.

## 2024-06-13 NOTE — Telephone Encounter (Signed)
 FYI Only or Action Required?: FYI only for provider.  Patient was last seen in primary care on 05/23/2024 by Amon Aloysius BRAVO, MD.  Called Nurse Triage reporting Shortness of Breath.  Symptoms began a couple of days ago.  Interventions attempted: Patient has been using his home oxygen.  Symptoms are: unchanged.  Triage Disposition: Go to ED Now (Notify PCP)  Patient/caregiver understands and will follow disposition?: Yes        Copied from CRM [id number]. Topic: Clinical - Red Word Triage >> Jun 13, 2024  3:21 PM Suzen RAMAN wrote: Red Word that prompted transfer to Nurse Triage: difficulty breathing         Reason for Disposition  [1] MODERATE difficulty breathing (e.g., speaks in phrases, SOB even at rest, pulse 100-120) AND [2] NEW-onset or WORSE than normal  Answer Assessment - Initial Assessment Questions 1. RESPIRATORY STATUS: Describe your breathing? (e.g., wheezing, shortness of breath, unable to speak, severe coughing)      Increased shortness of breath  2. ONSET: When did this breathing problem begin?       A couple of days ago  3. PATTERN Does the difficult breathing come and go, or has it been constant since it started?      Constant  4. SEVERITY: How bad is your breathing? (e.g., mild, moderate, severe)      Mild to moderate 5. RECURRENT SYMPTOM: Have you had difficulty breathing before? If Yes, ask: When was the last time? and What happened that time?      Yes 6. CARDIAC HISTORY: Do you have any history of heart disease? (e.g., heart attack, angina, bypass surgery, angioplasty)      Yes  7. LUNG HISTORY: Do you have any history of lung disease?  (e.g., pulmonary embolus, asthma, emphysema)     Yes 8. CAUSE: What do you think is causing the breathing problem?      History of COPD 9. OTHER SYMPTOMS: Do you have any other symptoms? (e.g., chest pain, cough, dizziness, fever, runny nose)     Loss of appetite  10. O2 SATURATION MONITOR:  Do  you use an oxygen saturation monitor (pulse oximeter) at home? If Yes, ask: What is your reading (oxygen level) today? What is your usual oxygen saturation reading? (e.g., 95%)       Has not checked it  Protocols used: Breathing Difficulty-A-AH

## 2024-06-13 NOTE — ED Triage Notes (Signed)
 Shortness of breath x 2 weeks , new cough , on home O2 [place] 5 L . Hypoxic in triage . RT at bedside ,  Alert and oriented x 4

## 2024-06-14 DIAGNOSIS — I5031 Acute diastolic (congestive) heart failure: Secondary | ICD-10-CM | POA: Diagnosis not present

## 2024-06-14 DIAGNOSIS — Z66 Do not resuscitate: Secondary | ICD-10-CM | POA: Diagnosis present

## 2024-06-14 DIAGNOSIS — I5082 Biventricular heart failure: Secondary | ICD-10-CM | POA: Diagnosis present

## 2024-06-14 DIAGNOSIS — Z7951 Long term (current) use of inhaled steroids: Secondary | ICD-10-CM | POA: Diagnosis not present

## 2024-06-14 DIAGNOSIS — J9612 Chronic respiratory failure with hypercapnia: Secondary | ICD-10-CM | POA: Diagnosis present

## 2024-06-14 DIAGNOSIS — Z79899 Other long term (current) drug therapy: Secondary | ICD-10-CM | POA: Diagnosis not present

## 2024-06-14 DIAGNOSIS — Z833 Family history of diabetes mellitus: Secondary | ICD-10-CM | POA: Diagnosis not present

## 2024-06-14 DIAGNOSIS — Z961 Presence of intraocular lens: Secondary | ICD-10-CM | POA: Diagnosis present

## 2024-06-14 DIAGNOSIS — I5033 Acute on chronic diastolic (congestive) heart failure: Secondary | ICD-10-CM | POA: Diagnosis present

## 2024-06-14 DIAGNOSIS — I10 Essential (primary) hypertension: Secondary | ICD-10-CM | POA: Diagnosis not present

## 2024-06-14 DIAGNOSIS — I272 Pulmonary hypertension, unspecified: Secondary | ICD-10-CM | POA: Diagnosis not present

## 2024-06-14 DIAGNOSIS — I50810 Right heart failure, unspecified: Secondary | ICD-10-CM | POA: Diagnosis not present

## 2024-06-14 DIAGNOSIS — I2729 Other secondary pulmonary hypertension: Secondary | ICD-10-CM | POA: Diagnosis present

## 2024-06-14 DIAGNOSIS — I110 Hypertensive heart disease with heart failure: Secondary | ICD-10-CM | POA: Diagnosis present

## 2024-06-14 DIAGNOSIS — E1151 Type 2 diabetes mellitus with diabetic peripheral angiopathy without gangrene: Secondary | ICD-10-CM | POA: Diagnosis present

## 2024-06-14 DIAGNOSIS — Z7189 Other specified counseling: Secondary | ICD-10-CM | POA: Diagnosis not present

## 2024-06-14 DIAGNOSIS — Z8249 Family history of ischemic heart disease and other diseases of the circulatory system: Secondary | ICD-10-CM | POA: Diagnosis not present

## 2024-06-14 DIAGNOSIS — Z1152 Encounter for screening for COVID-19: Secondary | ICD-10-CM | POA: Diagnosis not present

## 2024-06-14 DIAGNOSIS — J9611 Chronic respiratory failure with hypoxia: Secondary | ICD-10-CM | POA: Diagnosis present

## 2024-06-14 DIAGNOSIS — I2510 Atherosclerotic heart disease of native coronary artery without angina pectoris: Secondary | ICD-10-CM | POA: Diagnosis present

## 2024-06-14 DIAGNOSIS — N400 Enlarged prostate without lower urinary tract symptoms: Secondary | ICD-10-CM | POA: Diagnosis present

## 2024-06-14 DIAGNOSIS — E785 Hyperlipidemia, unspecified: Secondary | ICD-10-CM | POA: Diagnosis present

## 2024-06-14 DIAGNOSIS — Z950 Presence of cardiac pacemaker: Secondary | ICD-10-CM | POA: Diagnosis not present

## 2024-06-14 DIAGNOSIS — Z87891 Personal history of nicotine dependence: Secondary | ICD-10-CM | POA: Diagnosis not present

## 2024-06-14 DIAGNOSIS — I4819 Other persistent atrial fibrillation: Secondary | ICD-10-CM | POA: Diagnosis present

## 2024-06-14 DIAGNOSIS — I495 Sick sinus syndrome: Secondary | ICD-10-CM | POA: Diagnosis present

## 2024-06-14 DIAGNOSIS — G8929 Other chronic pain: Secondary | ICD-10-CM | POA: Diagnosis present

## 2024-06-14 DIAGNOSIS — J441 Chronic obstructive pulmonary disease with (acute) exacerbation: Secondary | ICD-10-CM | POA: Diagnosis present

## 2024-06-14 DIAGNOSIS — I4891 Unspecified atrial fibrillation: Secondary | ICD-10-CM | POA: Diagnosis not present

## 2024-06-14 DIAGNOSIS — Z515 Encounter for palliative care: Secondary | ICD-10-CM | POA: Diagnosis not present

## 2024-06-14 DIAGNOSIS — Z9981 Dependence on supplemental oxygen: Secondary | ICD-10-CM | POA: Diagnosis not present

## 2024-06-14 LAB — CBC
HCT: 36.9 % — ABNORMAL LOW (ref 39.0–52.0)
Hemoglobin: 12.1 g/dL — ABNORMAL LOW (ref 13.0–17.0)
MCH: 30.4 pg (ref 26.0–34.0)
MCHC: 32.8 g/dL (ref 30.0–36.0)
MCV: 92.7 fL (ref 80.0–100.0)
Platelets: 242 K/uL (ref 150–400)
RBC: 3.98 MIL/uL — ABNORMAL LOW (ref 4.22–5.81)
RDW: 14.2 % (ref 11.5–15.5)
WBC: 4.3 K/uL (ref 4.0–10.5)
nRBC: 0 % (ref 0.0–0.2)

## 2024-06-14 LAB — GLUCOSE, CAPILLARY
Glucose-Capillary: 145 mg/dL — ABNORMAL HIGH (ref 70–99)
Glucose-Capillary: 179 mg/dL — ABNORMAL HIGH (ref 70–99)
Glucose-Capillary: 222 mg/dL — ABNORMAL HIGH (ref 70–99)
Glucose-Capillary: 181 mg/dL — ABNORMAL HIGH (ref 70–99)

## 2024-06-14 LAB — BASIC METABOLIC PANEL WITH GFR
Anion gap: 10 (ref 5–15)
BUN: 14 mg/dL (ref 8–23)
CO2: 27 mmol/L (ref 22–32)
Calcium: 8.4 mg/dL — ABNORMAL LOW (ref 8.9–10.3)
Chloride: 105 mmol/L (ref 98–111)
Creatinine, Ser: 0.76 mg/dL (ref 0.61–1.24)
GFR, Estimated: >60 mL/min (ref >60)
Glucose, Bld: 176 mg/dL — ABNORMAL HIGH (ref 70–99)
Potassium: 4.3 mmol/L (ref 3.5–5.1)
Sodium: 142 mmol/L (ref 135–145)

## 2024-06-14 LAB — RESPIRATORY PANEL BY PCR

## 2024-06-14 LAB — PRO BRAIN NATRIURETIC PEPTIDE: Pro Brain Natriuretic Peptide: 4257 pg/mL — ABNORMAL HIGH (ref <300.0)

## 2024-06-14 NOTE — TOC Initial Note (Signed)
 Transition of Care (TOC) - Initial/Assessment Note    Patient Details  Name: Bradley JASPERSON Sr. MRN: [id number] Date of Birth: [birth year]  Transition of Care [facility]) CM/SW Contact:    Bascom Service, RN Phone Number: 06/14/2024, 3:14 PM  Clinical Narrative:  Spoke to Amber(grand dtr)about d/c plans-return home. Has portable home 02,Has own transport home.                 Expected Discharge Plan: Home/Self Care Barriers to Discharge: Continued Medical Work up   Patient Goals and CMS Choice Patient states their goals for this hospitalization and ongoing recovery are:: Home CMS Medicare.gov Compare Post Acute Care list provided to:: Patient Represenative (must comment) (Amber(granddtr)) Choice offered to / list presented to : Adult Children  ownership interest in [facility].provided to:: Adult Children    Expected Discharge Plan and Services   Discharge Planning Services: CM Consult   Living arrangements for the past 2 months: Single Family Home                                      Prior Living Arrangements/Services Living arrangements for the past 2 months: Single Family Home Lives with:: Adult Children   Do you feel safe going back to the place where you live?: Yes          Current home services: DME (home 02-has portable,cane,rw)    Activities of Daily Living   ADL Screening (condition at time of admission) Independently performs ADLs?: Yes (appropriate for developmental age) Is the patient deaf or have difficulty hearing?: Yes Does the patient have difficulty seeing, even when wearing glasses/contacts?: No Does the patient have difficulty concentrating, remembering, or making decisions?: No  Permission Sought/Granted Permission sought to share information with : Case Manager Permission granted to share information with : Yes, Verbal Permission Granted              Emotional Assessment              Admission diagnosis:  COPD  exacerbation (HCC) [J44.1] Patient Active Problem List   Diagnosis Date Noted   Displaced fracture of shaft of third metacarpal bone, left hand, sub encounter for closed fracture 08/03/2022   ILD (interstitial lung disease) (HCC) 07/30/2022   CAD (coronary artery disease) 07/19/2022   GERD without esophagitis 07/30/2021   Cor pulmonale (chronic) (HCC) 08/05/2020   COPD exacerbation (HCC) 12/22/2018   COPD,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 12/22/2018   PCP NOTES >>> 07/15/2015   Annual physical exam 05/08/2012   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   Sinoatrial node dysfunction (HCC)    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   Chronic diastolic heart failure (HCC)    PAD (peripheral artery disease) (HCC) 10/22/2010   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 [place] [address] Phone: [phone] Fax: [phone]  [id number] - [place], [place] - [address] [place] [address] Phone: [phone] Fax: [phone]     Social Drivers of Health (SDOH) Social History: SDOH Screenings   Food Insecurity: No Food Insecurity (06/13/2024)  Housing: Low Risk  (06/13/2024)  Transportation Needs: No Transportation Needs (06/13/2024)  Utilities: Not At Risk (06/13/2024)  Alcohol Screen: Low Risk  (04/11/2024)  Depression  (PHQ2-9): Low Risk  (05/23/2024)  Financial Resource Strain: Low Risk  (04/11/2024)  Physical Activity: Insufficiently Active (04/11/2024)  Social Connections: Moderately Isolated (06/13/2024)  Stress: No Stress Concern Present (04/11/2024)  Tobacco Use: Medium Risk (06/13/2024)  Health Literacy: Adequate Health Literacy (04/11/2024)   SDOH Interventions:     Readmission Risk Interventions    07/07/2023    2:33 PM 08/29/2022    3:33 PM  Readmission Risk Prevention Plan  Transportation Screening Complete Complete  PCP or Specialist Appt within 3-5 Days Complete Complete  HRI or Home Care Consult Complete Complete  Social Work Consult for Recovery Care Planning/Counseling Complete Complete  Palliative Care Screening Not Applicable Not Applicable  Medication Review Oceanographer) Referral to Pharmacy Complete

## 2024-06-14 NOTE — Evaluation (Signed)
 Physical Therapy One Time Evaluation Patient Details Name: Bradley ERIKSSON Sr. MRN: [id number] DOB: [birth year] Today's Date: 06/14/2024  History of Present Illness  Bradley SEDER Sr. is a 83 y.o. male transferred to [facility] with dx of COPD exacerbation; chronic hypoxic & hypercarbic respiratory failure  PMH: hypertension, type 2 diabetes mellitus, PAD, CAD, atrial fibrillation status post AV node ablation, not anticoagulated due to history of ICH, tachybradycardia syndrome with pacemaker, chronic HFpEF, COPD, and chronic hypoxic and hypercarbic respiratory failure  Clinical Impression  Patient evaluated by Physical Therapy with no further acute PT needs identified. All education has been completed and the patient has no further questions.  Pt very pleasant and agreeable to participate.  Pt ambulated in hallway and SPO2 90% upon returning to room with ambulating on 4L O2 [place] (pt reports 5L at baseline).  Pt declined attempting any steps today but does have stair lift to second floor of townhome if needed.  Pt anticipates return home at d/c. No further follow-up Physical Therapy or equipment needs. PT is signing off. Thank you for this referral.         If plan is discharge home, recommend the following: Help with stairs or ramp for entrance   Can travel by private vehicle        Equipment Recommendations None recommended by PT  Recommendations for Other Services       Functional Status Assessment Patient has had a recent decline in their functional status and demonstrates the ability to make significant improvements in function in a reasonable and predictable amount of time.     Precautions / Restrictions Precautions Precautions: Other (comment) Precaution/Restrictions Comments: 5L O2 at baseline      Mobility  Bed Mobility Overal bed mobility: Modified Independent                  Transfers Overall transfer level: Modified independent                       Ambulation/Gait Ambulation/Gait assistance: Supervision, Modified independent (Device/Increase time) Gait Distance (Feet): 120 Feet Assistive device: Rolling walker (2 wheels) Gait Pattern/deviations: Step-through pattern, Decreased stride length       General Gait Details: slow but steady with RW, ambulated on 4L O2 (tank only 4 or 6L and pt preferred 4L) and SPO2 90% upon return to room (reapplied to 5L upon return to bed which is pt's baseline)  Stairs            Wheelchair Mobility     Tilt Bed    Modified Rankin (Stroke Patients Only)       Balance Overall balance assessment: No apparent balance deficits (not formally assessed) (denies any recent falls)                                           Pertinent Vitals/Pain Pain Assessment Pain Assessment: 0-10 Pain Score: 5  Pain Location: left lower ribcage reported as chest pain by pt but none upon end of session - RN notified and aware Pain Descriptors / Indicators: Sore Pain Intervention(s): Repositioned, Monitored during session    Home Living Family/patient expects to be discharged to:: Private residence Living Arrangements: Other relatives Available Help at Discharge: Family;Available 24 hours/day Type of Home: House (townhouse) Home Access: Stairs to enter Entrance Stairs-Rails: Dealer of Steps: 1+1 Alternate Level Stairs-Number  of Steps: 3 story townhome with stair lift to 2nd floor, does not go to 3rd floor Home Layout: Multi-level Home Equipment: Cane - single point;Shower seat - built in;Grab bars - Chartered loss adjuster (2 wheels);Hand held shower head;Grab bars - toilet;Other (comment);Rollator (4 wheels) (chair lift) Additional Comments: 5LPM O2 at baseline 24/7    Prior Function Prior Level of Function : Independent/Modified Independent;History of Falls (last six months)             Mobility Comments: mod I with rollator and portable O2, had  wrist fx last Sept 2024 from fall ADLs Comments: Independent to Mod I with ADLs and IADLs, family in home assist with higher level meals, laundry and groceries     Extremity/Trunk Assessment        Lower Extremity Assessment Lower Extremity Assessment: Overall WFL for tasks assessed    Cervical / Trunk Assessment Cervical / Trunk Assessment: Normal  Communication   Communication Communication: Impaired Factors Affecting Communication: Hearing impaired    Cognition Arousal: Alert Behavior During Therapy: WFL for tasks assessed/performed   PT - Cognitive impairments: No apparent impairments                         Following commands: Intact       Cueing       General Comments      Exercises     Assessment/Plan    PT Assessment Patient does not need any further PT services  PT Problem List         PT Treatment Interventions      PT Goals (Current goals can be found in the Care Plan section)  Acute Rehab PT Goals PT Goal Formulation: All assessment and education complete, DC therapy    Frequency       Co-evaluation               AM-PAC PT 6 Clicks Mobility  Outcome Measure Help needed turning from your back to your side while in a flat bed without using bedrails?: None Help needed moving from lying on your back to sitting on the side of a flat bed without using bedrails?: None Help needed moving to and from a bed to a chair (including a wheelchair)?: None Help needed standing up from a chair using your arms (e.g., wheelchair or bedside chair)?: None Help needed to walk in hospital room?: A Little Help needed climbing 3-5 steps with a railing? : A Little 6 Click Score: 22    End of Session Equipment Utilized During Treatment: Gait belt Activity Tolerance: Patient tolerated treatment well Patient left: in bed;with call bell/phone within reach Nurse Communication: Mobility status PT Visit Diagnosis: Difficulty in walking, not  elsewhere classified (R26.2)    Time: 8567-8556 PT Time Calculation (min) (ACUTE ONLY): 11 min   Charges:   PT Evaluation $PT Eval Low Complexity: 1 Low   PT General Charges $$ ACUTE PT VISIT: 1 Visit        Bradley PT, DPT Physical Therapist Acute Rehabilitation Services Office: [phone]   Bradley [name] 06/14/2024, 4:15 PM

## 2024-06-14 NOTE — Evaluation (Signed)
 Occupational Therapy Evaluation Patient Details Name: Bradley LORTIE Sr. MRN: [id number] DOB: 1941/04/30 Today's Date: 06/14/2024   History of Present Illness   Bradley MCCAHILL Sr. is a 83 y.o. male transferred to [facility] with dx of COPD exacerbation; chronic hypoxic & hypercarbic respiratory failure  PMH: hypertension, type 2 diabetes mellitus, PAD, CAD, atrial fibrillation status post AV node ablation, not anticoagulated due to history of ICH, tachybradycardia syndrome with pacemaker, chronic HFpEF, COPD, and chronic hypoxic and hypercarbic respiratory failure     Clinical Impressions Patient evaluated by Occupational Therapy with no further acute OT needs identified. All education has been completed and the patient has no further questions. Issued and trained in 5P's Handout for energy conservation which patient able to + teach back and endorses he has used in past and refers to at home. See below for any follow-up Occupational Therapy or equipment needs. OT is signing off. Thank you for this referral.      If plan is discharge home, recommend the following:   Assist for transportation;Help with stairs or ramp for entrance     Functional Status Assessment   Patient has not had a recent decline in their functional status     Equipment Recommendations   None recommended by OT      Precautions/Restrictions   Precautions Precautions: Other (comment) Precaution/Restrictions Comments: watch O2 Restrictions Weight Bearing Restrictions Per Provider Order: No     Mobility Bed Mobility Overal bed mobility: Modified Independent                  Transfers Overall transfer level: Modified independent Equipment used: Rolling walker (2 wheels)               General transfer comment: in and out of bathroom and short household amb performed with min cues for breathing and pacing with + teach back      Balance Overall balance assessment: No apparent balance deficits (not  formally assessed)                                         ADL either performed or assessed with clinical judgement   ADL Overall ADL's : Modified independent;At baseline                                             Vision Baseline Vision/History: 0 No visual deficits              Pertinent Vitals/Pain Pain Assessment Pain Assessment: No/denies pain     Extremity/Trunk Assessment Upper Extremity Assessment Upper Extremity Assessment: Overall WFL for tasks assessed;Right hand dominant   Lower Extremity Assessment Lower Extremity Assessment: Defer to PT evaluation   Cervical / Trunk Assessment Cervical / Trunk Assessment: Normal   Communication Communication Communication: Impaired Factors Affecting Communication: Hearing impaired   Cognition Arousal: Alert Behavior During Therapy: WFL for tasks assessed/performed Cognition: No apparent impairments                               Following commands: Intact       Cueing  General Comments   Cueing Techniques: Verbal cues  some minor discolorations and bruising on lower legs, no edema, SpO2 remained 92-96% wiht  5 ltrs O2 during visit pre and post.           Home Living Family/patient expects to be discharged to:: Private residence Living Arrangements: Other relatives Available Help at Discharge: Family;Available 24 hours/day Type of Home: House (townhouse) Home Access: Stairs to enter Secretary/administrator of Steps: 1+1 Entrance Stairs-Rails: None;Right Home Layout: Multi-level Alternate Level Stairs-Number of Steps: 3 story townhome with stair lift to 2nd floor, does not go to 3rd floor Alternate Level Stairs-Rails: Right Bathroom Shower/Tub: Producer, television/film/video: Handicapped height Bathroom Accessibility: No   Home Equipment: Cane - single point;Shower seat - built in;Grab bars - Chartered loss adjuster (2 wheels);Hand held shower head;Grab  bars - toilet;Other (comment);Rollator (4 wheels) (chair lift)   Additional Comments: 5LPM O2 at baseline 24/7      Prior Functioning/Environment Prior Level of Function : Independent/Modified Independent;History of Falls (last six months)             Mobility Comments: mod I with rollator and portable O2, had wrist fx last Sept 2024 from fall ADLs Comments: Independent to Mod I with ADLs and IADLs, family in home assist with higher level meals, laundry and groceries     AM-PAC OT 6 Clicks Daily Activity     Outcome Measure Help from another person eating meals?: None Help from another person taking care of personal grooming?: None Help from another person toileting, which includes using toliet, bedpan, or urinal?: None Help from another person bathing (including washing, rinsing, drying)?: None Help from another person to put on and taking off regular upper body clothing?: None Help from another person to put on and taking off regular lower body clothing?: None 6 Click Score: 24   End of Session Equipment Utilized During Treatment: Gait belt;Rolling walker (2 wheels) Nurse Communication: Mobility status;Other (comment) (SpO2 post amb)  Activity Tolerance: Patient tolerated treatment well Patient left: in bed;with call bell/phone within reach;with chair alarm set                   Time: 1115-1200 OT Time Calculation (min): 45 min Charges:  OT General Charges $OT Visit: 1 Visit OT Evaluation $OT Eval Low Complexity: 1 Low OT Treatments $Therapeutic Activity: 8-22 mins  Amyre Segundo OT/L Acute Rehabilitation Department  [phone]  06/14/2024, 1:18 PM

## 2024-06-14 NOTE — Progress Notes (Signed)
 PROGRESS NOTE    Bradley [name]  FMW:[id number] DOB: [birth year] DOA: 06/13/2024 PCP: Amon Aloysius BRAVO, MD    Brief Narrative:  Bradley JINNY Dalia Sr. is a 83 y.o. male with medical history significant for hypertension, type 2 diabetes mellitus, PAD, CAD, atrial fibrillation status post AV node ablation, not anticoagulated due to history of ICH, tachybradycardia syndrome with pacemaker, chronic HFpEF, COPD, and chronic hypoxic and hypercarbic respiratory failure who presents with cough and shortness of breath.   Patient reports worsening shortness of breath over the past few days, particularly with activity.  He has increased cough associated with this.  He denies any chest pain, fever, chills, or lower extremity swelling.   Assessment and Plan: 1. COPD exacerbation; chronic hypoxic & hypercarbic respiratory failure  - Culture sputum, - continue systemic steroid  - Wean oxygen back to 5 L - Scheduled nebs and as needed   Chronic HFpEF  - Appears compensated  - Continue Lasix , monitor volume status     Atrial fibrillation  - S/p AV nodal ablation; has not been anticoagulated d/t hx of ICH  - Continue bisoprolol      CAD  - No recent angina  - Continue ASA and Lipitor     Hypertension  - Norvasc       Type II DM  - A1c was 6.5% in August 2025  - Check CBGs and use low-intensity SSI for now    DVT prophylaxis: enoxaparin  (LOVENOX ) injection 40 mg Start: 06/14/24 1000    Code Status: Full Code Family Communication: Son-in-law at bedside  Disposition Plan:  Level of care: Med-Surg Status is: Observation     Consultants:     Subjective: Feeling better but not back to baseline yet  Objective: Vitals:   06/14/24 0000 06/14/24 0301 06/14/24 0611 06/14/24 1030  BP:  125/73 111/78 122/77  Pulse:  72 69 70  Resp:  20 18 16   Temp:  97.6 F (36.4 C) 98.2 F (36.8 C) (!) 97.2 F (36.2 C)  TempSrc:  Oral Oral Oral  SpO2:  94% 99% 100%  Weight: 70.8 kg     Height: 5' 8  (1.727 m)       Intake/Output Summary (Last 24 hours) at 06/14/2024 1207 Last data filed at 06/14/2024 0900 Gross per 24 hour  Intake 2740 ml  Output 900 ml  Net 1840 ml   Filed Weights   06/14/24 0000  Weight: 70.8 kg    Examination:   General: Appearance:    Well developed, well nourished male in no acute distress     Lungs:     Diminished, occasional wheeze- not able to speak in full sentences  Heart:    Normal heart rate.    MS:   All extremities are intact.    Neurologic:   Awake, alert, oriented x 3. No apparent focal neurological           defect.        Data Reviewed: I have personally reviewed following labs and imaging studies  CBC: Recent Labs  Lab 06/13/24 1624 06/14/24 0502  WBC 7.5 4.3  HGB 12.4* 12.1*  HCT 37.9* 36.9*  MCV 91.3 92.7  PLT 279 242   Basic Metabolic Panel: Recent Labs  Lab 06/13/24 1624 06/14/24 0502  NA 140 142  K 4.1 4.3  CL 101 105  CO2 29 27  GLUCOSE 104* 176*  BUN 13 14  CREATININE 0.91 0.76  CALCIUM  8.3* 8.4*   GFR: Estimated Creatinine  Clearance: 67.7 mL/min (by C-G formula based on SCr of 0.76 mg/dL). Liver Function Tests: No results for input(s): AST, ALT, ALKPHOS, BILITOT, PROT, ALBUMIN in the last 168 hours. No results for input(s): LIPASE, AMYLASE in the last 168 hours. No results for input(s): AMMONIA in the last 168 hours. Coagulation Profile: No results for input(s): INR, PROTIME in the last 168 hours. Cardiac Enzymes: No results for input(s): CKTOTAL, CKMB, CKMBINDEX, TROPONINI in the last 168 hours. BNP (last 3 results) Recent Labs    06/14/24 0502  PROBNP 4,257.0*   HbA1C: No results for input(s): HGBA1C in the last 72 hours. CBG: Recent Labs  Lab 06/13/24 2324 06/14/24 0732 06/14/24 1141  GLUCAP 181* 145* 179*   Lipid Profile: No results for input(s): CHOL, HDL, LDLCALC, TRIG, CHOLHDL, LDLDIRECT in the last 72 hours. Thyroid  Function Tests: No  results for input(s): TSH, T4TOTAL, FREET4, T3FREE, THYROIDAB in the last 72 hours. Anemia Panel: No results for input(s): VITAMINB12, FOLATE, FERRITIN, TIBC, IRON, RETICCTPCT in the last 72 hours. Sepsis Labs: No results for input(s): PROCALCITON, LATICACIDVEN in the last 168 hours.  Recent Results (from the past 240 hours)  Resp panel by RT-PCR (RSV, Flu A&B, Covid) Anterior Nasal Swab     Status: None   Collection Time: 06/13/24  5:15 PM   Specimen: Anterior Nasal Swab  Result Value Ref Range Status   SARS Coronavirus 2 by RT PCR NEGATIVE NEGATIVE Final    Comment: (NOTE) SARS-CoV-2 target nucleic acids are NOT DETECTED.  The SARS-CoV-2 RNA is generally detectable in upper respiratory specimens during the acute phase of infection. The lowest concentration of SARS-CoV-2 viral copies this assay can detect is 138 copies/mL. A negative result does not preclude SARS-Cov-2 infection and should not be used as the sole basis for treatment or other patient management decisions. A negative result may occur with  improper specimen collection/handling, submission of specimen other than nasopharyngeal swab, presence of viral mutation(s) within the areas targeted by this assay, and inadequate number of viral copies(<138 copies/mL). A negative result must be combined with clinical observations, patient history, and epidemiological information. The expected result is Negative.  Fact Sheet for Patients:  BloggerCourse.com  Fact Sheet for Healthcare Providers:  SeriousBroker.it  This test is no t yet approved or cleared by the [place]  FDA and  has been authorized for detection and/or diagnosis of SARS-CoV-2 by FDA under an Emergency Use Authorization (EUA). This EUA will remain  in effect (meaning this test can be used) for the duration of the COVID-19 declaration under Section 564(b)(1) of the Act,  21 U.S.C.section 360bbb-3(b)(1), unless the authorization is terminated  or revoked sooner.       Influenza A by PCR NEGATIVE NEGATIVE Final   Influenza B by PCR NEGATIVE NEGATIVE Final    Comment: (NOTE) The Xpert Xpress SARS-CoV-2/FLU/RSV plus assay is intended as an aid in the diagnosis of influenza from Nasopharyngeal swab specimens and should not be used as a sole basis for treatment. Nasal washings and aspirates are unacceptable for Xpert Xpress SARS-CoV-2/FLU/RSV testing.  Fact Sheet for Patients: BloggerCourse.com  Fact Sheet for Healthcare Providers: SeriousBroker.it  This test is not yet approved or cleared by the [place]  FDA and has been authorized for detection and/or diagnosis of SARS-CoV-2 by FDA under an Emergency Use Authorization (EUA). This EUA will remain in effect (meaning this test can be used) for the duration of the COVID-19 declaration under Section 564(b)(1) of the Act, 21 U.S.C. section 360bbb-3(b)(1), unless the  authorization is terminated or revoked.     Resp Syncytial Virus by PCR NEGATIVE NEGATIVE Final    Comment: (NOTE) Fact Sheet for Patients: BloggerCourse.com  Fact Sheet for Healthcare Providers: SeriousBroker.it  This test is not yet approved or cleared by the [place]  FDA and has been authorized for detection and/or diagnosis of SARS-CoV-2 by FDA under an Emergency Use Authorization (EUA). This EUA will remain in effect (meaning this test can be used) for the duration of the COVID-19 declaration under Section 564(b)(1) of the Act, 21 U.S.C. section 360bbb-3(b)(1), unless the authorization is terminated or revoked.  Performed at [facility], [address], [place], [place] [address]          Radiology Studies: CT Angio Chest PE W/Cm &/Or Wo Cm Result Date: 06/13/2024 CLINICAL DATA:  Home oxygen therapy.  COPD. Shortness of breath. Cough. Hypoxia. EXAM: CT ANGIOGRAPHY CHEST WITH CONTRAST TECHNIQUE: Multidetector CT imaging of the chest was performed using the standard protocol during bolus administration of intravenous contrast. Multiplanar CT image reconstructions and MIPs were obtained to evaluate the vascular anatomy. RADIATION DOSE REDUCTION: This exam was performed according to the departmental dose-optimization program which includes automated exposure control, adjustment of the mA and/or kV according to patient size and/or use of iterative reconstruction technique. CONTRAST:  80mL OMNIPAQUE  IOHEXOL  350 MG/ML SOLN COMPARISON:  05/26/2023 FINDINGS: Despite efforts by the technologist and patient, motion artifact is present on today's exam and could not be eliminated. This reduces exam sensitivity and specificity. Cardiovascular: No filling defect is identified in the pulmonary arterial tree to suggest pulmonary embolus. Coronary, aortic arch, and branch vessel atherosclerotic vascular disease. Moderate to prominent cardiomegaly especially involving the right atrium. Pacer noted. Mediastinum/Nodes: Borderline enlarged AP window lymph node 1.0 cm in short axis on image 46 series 5, previously 0.9 cm. Right lower paratracheal node mildly enlarged 1.2 cm in short axis on image 41 series 5, formerly 1.0 cm. Similar mildly prominent subcarinal lymph node. Lungs/Pleura: Chronic mosaic attenuation in the lungs. Scarring in the right middle lobe and along the minor fissure. Calcified pleural plaque along the diaphragm with pleural thickening laterally along the right lower lobe and right middle lobe similar to previous. Chronic volume loss and atelectasis in the lingula. Chronic lateral pleural thickening at the left lung base. Upper Abdomen: Abdominal aortic atherosclerosis. Musculoskeletal: Old rib deformities compatible with prior fractures. Suspected late phase healing fracture the right anterior sixth rib on image 84  series 13. Thoracic spondylosis. Review of the MIP images confirms the above findings. IMPRESSION: 1. No filling defect is identified in the pulmonary arterial tree to suggest pulmonary embolus. 2. Chronic mosaic attenuation in the lungs, possibly from small airways disease. 3. Chronic pleural thickening and calcified pleural plaque along the right diaphragm. 4. Moderate to prominent cardiomegaly especially involving the right atrium. 5. Mildly enlarged mediastinal lymph nodes, nonspecific. 6. Suspected late phase healing fracture of the right anterior sixth rib. 7.  Aortic Atherosclerosis (ICD10-I70.0). Electronically Signed   By: Ryan Salvage M.D.   On: 06/13/2024 18:38   DG Chest Portable 1 View Result Date: 06/13/2024 CLINICAL DATA:  shortness of breath EXAM: PORTABLE CHEST - 1 VIEW COMPARISON:  October 03, 2023 FINDINGS: Bilateral perihilar interstitial opacities. No lobar consolidation or pneumothorax. Small right pleural effusion. Possible trace left pleural effusion. Mild cardiomegaly. Left chest pacemaker with leads terminating in the right ventricle. Tortuous aorta with aortic atherosclerosis. No acute fracture or destructive lesions. Multilevel thoracic osteophytosis. IMPRESSION: Bilateral  perihilar interstitial opacities, which may represent interstitial edema or atypical/viral infection, in the correct clinical context. Electronically Signed   By: Rogelia Myers M.D.   On: 06/13/2024 17:10        Scheduled Meds:  amLODipine   10 mg Oral Daily   aspirin   81 mg Oral q morning   atorvastatin   40 mg Oral QHS   bisoprolol   10 mg Oral Daily   enoxaparin  (LOVENOX ) injection  40 mg Subcutaneous Q24H   furosemide   40 mg Oral q1600   furosemide   80 mg Oral Q breakfast   insulin  aspart  0-5 Units Subcutaneous QHS   insulin  aspart  0-6 Units Subcutaneous TID WC   methylPREDNISolone  (SOLU-MEDROL ) injection  125 mg Intravenous Q12H   pantoprazole   40 mg Oral QAC breakfast   sodium chloride   flush  3 mL Intravenous Q12H   tamsulosin   0.4 mg Oral QPC supper   Continuous Infusions:  cefTRIAXone  (ROCEPHIN )  IV 1 g (06/13/24 2328)     LOS: 0 days    Time spent: 45 minutes spent on chart review, discussion with nursing staff, consultants, updating family and interview/physical exam; more than 50% of that time was spent in counseling and/or coordination of care.    Harlene RAYMOND Bowl, DO [facility] Available via Epic secure chat 7am-7pm After these hours, please refer to coverage provider listed on [email or web address] 06/14/2024, 12:07 PM

## 2024-06-15 ENCOUNTER — Encounter (HOSPITAL_BASED_OUTPATIENT_CLINIC_OR_DEPARTMENT_OTHER): Payer: Self-pay

## 2024-06-15 DIAGNOSIS — J441 Chronic obstructive pulmonary disease with (acute) exacerbation: Secondary | ICD-10-CM | POA: Diagnosis not present

## 2024-06-15 LAB — CBC
HCT: 38.6 % — ABNORMAL LOW (ref 39.0–52.0)
Hemoglobin: 12.4 g/dL — ABNORMAL LOW (ref 13.0–17.0)
MCH: 30.3 pg (ref 26.0–34.0)
MCHC: 32.1 g/dL (ref 30.0–36.0)
MCV: 94.4 fL (ref 80.0–100.0)
Platelets: 256 K/uL (ref 150–400)
RBC: 4.09 MIL/uL — ABNORMAL LOW (ref 4.22–5.81)
RDW: 14.4 % (ref 11.5–15.5)
WBC: 9.2 K/uL (ref 4.0–10.5)
nRBC: 0 % (ref 0.0–0.2)

## 2024-06-15 LAB — BASIC METABOLIC PANEL WITH GFR
Anion gap: 11 (ref 5–15)
BUN: 21 mg/dL (ref 8–23)
CO2: 26 mmol/L (ref 22–32)
Calcium: 8.5 mg/dL — ABNORMAL LOW (ref 8.9–10.3)
Chloride: 103 mmol/L (ref 98–111)
Creatinine, Ser: 0.96 mg/dL (ref 0.61–1.24)
GFR, Estimated: >60 mL/min (ref >60)
Glucose, Bld: 187 mg/dL — ABNORMAL HIGH (ref 70–99)
Potassium: 4.6 mmol/L (ref 3.5–5.1)
Sodium: 140 mmol/L (ref 135–145)

## 2024-06-15 LAB — GLUCOSE, CAPILLARY
Glucose-Capillary: 189 mg/dL — ABNORMAL HIGH (ref 70–99)
Glucose-Capillary: 173 mg/dL — ABNORMAL HIGH (ref 70–99)
Glucose-Capillary: 216 mg/dL — ABNORMAL HIGH (ref 70–99)
Glucose-Capillary: 181 mg/dL — ABNORMAL HIGH (ref 70–99)

## 2024-06-15 LAB — TROPONIN T, HIGH SENSITIVITY: Troponin T High Sensitivity: <15 ng/L (ref 0–19)

## 2024-06-15 LAB — PRO BRAIN NATRIURETIC PEPTIDE: Pro Brain Natriuretic Peptide: 6826 pg/mL — ABNORMAL HIGH (ref <300.0)

## 2024-06-15 MED ORDER — FUROSEMIDE 40 MG PO TABS
80.0000 mg | ORAL_TABLET | Freq: Every day | ORAL | Status: DC
  Administered 2024-06-15: 80 mg via ORAL
  Filled 2024-06-15 (×2): qty 2

## 2024-06-15 MED ORDER — DICLOFENAC SODIUM 1 % EX GEL
2.0000 g | Freq: Four times a day (QID) | CUTANEOUS | Status: DC
  Administered 2024-06-15 – 2024-06-21 (×12): 2 g via TOPICAL
  Filled 2024-06-15: qty 100

## 2024-06-15 NOTE — Progress Notes (Signed)
 PROGRESS NOTE    Marcello Tuzzolino  FMW:[id number] DOB: 01-Jul-1941 DOA: 06/13/2024 PCP: Amon Aloysius BRAVO, MD    Brief Narrative:  [name] is a 83 y.o. male with medical history significant for hypertension, type 2 diabetes mellitus, PAD, CAD, atrial fibrillation status post AV node ablation, not anticoagulated due to history of ICH, tachybradycardia syndrome with pacemaker, chronic HFpEF, COPD, and chronic hypoxic and hypercarbic respiratory failure who presents with cough and shortness of breath.   Patient reports worsening shortness of breath over the past few days, particularly with activity.  He has increased cough associated with this.  He denies any chest pain, fever, chills, or lower extremity swelling.   Assessment and Plan: COPD exacerbation; chronic hypoxic & hypercarbic respiratory failure  - Culture sputum not done yet - continue systemic steroid-- IV for now - Wean oxygen back to 5 L - Scheduled nebs and as needed -seems to be able to speak with less dyspnea   Chronic HFpEF  - Appears compensated -- pro BNP elevated but no LE edema - Continue Lasix  but increase dose, monitor volume status     Atrial fibrillation  - S/p AV nodal ablation; has not been anticoagulated d/t hx of ICH  - Continue bisoprolol      CAD  - No recent angina  - Continue ASA and Lipitor     Hypertension  - Norvasc  -- decrease dose    Type II DM  - A1c was 6.5% in August 2025     DVT prophylaxis: enoxaparin  (LOVENOX ) injection 40 mg Start: 06/14/24 1000    Code Status: Full Code Family Communication: Son-in-law at bedside  Disposition Plan:  Level of care: Med-Surg        Subjective: Some left sided chest pain-- reproducible with palpation  Objective: Vitals:   06/14/24 1030 06/14/24 1324 06/14/24 1935 06/15/24 0438  BP: 122/77 113/65 (!) 94/53 121/80  Pulse: 70 74 69 80  Resp: 16 17 20 19   Temp: (!) 97.2 F (36.2 C) (!) 97.3 F (36.3 C) 97.9 F (36.6 C)   TempSrc:  Oral Oral Oral   SpO2: 100% 95% 95% 96%  Weight:      Height:        Intake/Output Summary (Last 24 hours) at 06/15/2024 1027 Last data filed at 06/14/2024 2101 Gross per 24 hour  Intake 360 ml  Output 775 ml  Net -415 ml   Filed Weights   06/14/24 0000  Weight: 70.8 kg    Examination:   General: Appearance:    Well developed, well nourished male in no acute distress     Lungs:     Diminished, occasional wheeze- on [facility]  Heart:    Normal heart rate.    MS:   All extremities are intact.    Neurologic:   Awake, alert, oriented x 3. No apparent focal neurological           defect.        Data Reviewed: I have personally reviewed following labs and imaging studies  CBC: Recent Labs  Lab 06/13/24 1624 06/14/24 0502 06/15/24 0420  WBC 7.5 4.3 9.2  HGB 12.4* 12.1* 12.4*  HCT 37.9* 36.9* 38.6*  MCV 91.3 92.7 94.4  PLT 279 242 256   Basic Metabolic Panel: Recent Labs  Lab 06/13/24 1624 06/14/24 0502 06/15/24 0420  NA 140 142 140  K 4.1 4.3 4.6  CL 101 105 103  CO2 29 27 26   GLUCOSE 104* 176* 187*  BUN 13 14 21   CREATININE 0.91 0.76 0.96  CALCIUM  8.3* 8.4* 8.5*   GFR: Estimated Creatinine Clearance: 56.4 mL/min (by C-G formula based on SCr of 0.96 mg/dL). Liver Function Tests: No results for input(s): AST, ALT, ALKPHOS, BILITOT, PROT, ALBUMIN in the last 168 hours. No results for input(s): LIPASE, AMYLASE in the last 168 hours. No results for input(s): AMMONIA in the last 168 hours. Coagulation Profile: No results for input(s): INR, PROTIME in the last 168 hours. Cardiac Enzymes: No results for input(s): CKTOTAL, CKMB, CKMBINDEX, TROPONINI in the last 168 hours. BNP (last 3 results) Recent Labs    06/14/24 0502  PROBNP 4,257.0*   HbA1C: No results for input(s): HGBA1C in the last 72 hours. CBG: Recent Labs  Lab 06/14/24 0732 06/14/24 1141 06/14/24 1647 06/14/24 2055 06/15/24 0741  GLUCAP 145* 179* 222* 181* 189*    Lipid Profile: No results for input(s): CHOL, HDL, LDLCALC, TRIG, CHOLHDL, LDLDIRECT in the last 72 hours. Thyroid  Function Tests: No results for input(s): TSH, T4TOTAL, FREET4, T3FREE, THYROIDAB in the last 72 hours. Anemia Panel: No results for input(s): VITAMINB12, FOLATE, FERRITIN, TIBC, IRON, RETICCTPCT in the last 72 hours. Sepsis Labs: No results for input(s): PROCALCITON, LATICACIDVEN in the last 168 hours.  Recent Results (from the past 240 hours)  Resp panel by RT-PCR (RSV, Flu A&B, Covid) Anterior Nasal Swab     Status: None   Collection Time: 06/13/24  5:15 PM   Specimen: Anterior Nasal Swab  Result Value Ref Range Status   SARS Coronavirus 2 by RT PCR NEGATIVE NEGATIVE Final    Comment: (NOTE) SARS-CoV-2 target nucleic acids are NOT DETECTED.  The SARS-CoV-2 RNA is generally detectable in upper respiratory specimens during the acute phase of infection. The lowest concentration of SARS-CoV-2 viral copies this assay can detect is 138 copies/mL. A negative result does not preclude SARS-Cov-2 infection and should not be used as the sole basis for treatment or other patient management decisions. A negative result may occur with  improper specimen collection/handling, submission of specimen other than nasopharyngeal swab, presence of viral mutation(s) within the areas targeted by this assay, and inadequate number of viral copies(<138 copies/mL). A negative result must be combined with clinical observations, patient history, and epidemiological information. The expected result is Negative.  Fact Sheet for Patients:  BloggerCourse.com  Fact Sheet for Healthcare Providers:  SeriousBroker.it  This test is no t yet approved or cleared by the [place]  FDA and  has been authorized for detection and/or diagnosis of SARS-CoV-2 by FDA under an Emergency Use Authorization (EUA). This EUA  will remain  in effect (meaning this test can be used) for the duration of the COVID-19 declaration under Section 564(b)(1) of the Act, 21 U.S.C.section 360bbb-3(b)(1), unless the authorization is terminated  or revoked sooner.       Influenza A by PCR NEGATIVE NEGATIVE Final   Influenza B by PCR NEGATIVE NEGATIVE Final    Comment: (NOTE) The Xpert Xpress SARS-CoV-2/FLU/RSV plus assay is intended as an aid in the diagnosis of influenza from Nasopharyngeal swab specimens and should not be used as a sole basis for treatment. Nasal washings and aspirates are unacceptable for Xpert Xpress SARS-CoV-2/FLU/RSV testing.  Fact Sheet for Patients: BloggerCourse.com  Fact Sheet for Healthcare Providers: SeriousBroker.it  This test is not yet approved or cleared by the [place]  FDA and has been authorized for detection and/or diagnosis of SARS-CoV-2 by FDA under an Emergency Use Authorization (EUA). This EUA will remain in effect (  meaning this test can be used) for the duration of the COVID-19 declaration under Section 564(b)(1) of the Act, 21 U.S.C. section 360bbb-3(b)(1), unless the authorization is terminated or revoked.     Resp Syncytial Virus by PCR NEGATIVE NEGATIVE Final    Comment: (NOTE) Fact Sheet for Patients: BloggerCourse.com  Fact Sheet for Healthcare Providers: SeriousBroker.it  This test is not yet approved or cleared by the [place]  FDA and has been authorized for detection and/or diagnosis of SARS-CoV-2 by FDA under an Emergency Use Authorization (EUA). This EUA will remain in effect (meaning this test can be used) for the duration of the COVID-19 declaration under Section 564(b)(1) of the Act, 21 U.S.C. section 360bbb-3(b)(1), unless the authorization is terminated or revoked.  Performed at [facility], [address] [place], [place]  [address]   Respiratory (~20 pathogens) panel by PCR     Status: None   Collection Time: 06/14/24  8:57 AM   Specimen: Nasopharyngeal Swab; Respiratory  Result Value Ref Range Status   Adenovirus NOT DETECTED NOT DETECTED Final   Coronavirus 229E NOT DETECTED NOT DETECTED Final    Comment: (NOTE) The Coronavirus on the Respiratory Panel, DOES NOT test for the novel  Coronavirus (2019 nCoV)    Coronavirus HKU1 NOT DETECTED NOT DETECTED Final   Coronavirus NL63 NOT DETECTED NOT DETECTED Final   Coronavirus OC43 NOT DETECTED NOT DETECTED Final   Metapneumovirus NOT DETECTED NOT DETECTED Final   Rhinovirus / Enterovirus NOT DETECTED NOT DETECTED Final   Influenza A NOT DETECTED NOT DETECTED Final   Influenza B NOT DETECTED NOT DETECTED Final   Parainfluenza Virus 1 NOT DETECTED NOT DETECTED Final   Parainfluenza Virus 2 NOT DETECTED NOT DETECTED Final   Parainfluenza Virus 3 NOT DETECTED NOT DETECTED Final   Parainfluenza Virus 4 NOT DETECTED NOT DETECTED Final   Respiratory Syncytial Virus NOT DETECTED NOT DETECTED Final   Bordetella pertussis NOT DETECTED NOT DETECTED Final   Bordetella Parapertussis NOT DETECTED NOT DETECTED Final   Chlamydophila pneumoniae NOT DETECTED NOT DETECTED Final   Mycoplasma pneumoniae NOT DETECTED NOT DETECTED Final    Comment: Performed at [facility], [address] [place], [place] [address]         Radiology Studies: CT Angio Chest PE W/Cm &/Or Wo Cm Result Date: 06/13/2024 CLINICAL DATA:  Home oxygen therapy. COPD. Shortness of breath. Cough. Hypoxia. EXAM: CT ANGIOGRAPHY CHEST WITH CONTRAST TECHNIQUE: Multidetector CT imaging of the chest was performed using the standard protocol during bolus administration of intravenous contrast. Multiplanar CT image reconstructions and MIPs were obtained to evaluate the vascular anatomy. RADIATION DOSE REDUCTION: This exam was performed according to the departmental dose-optimization program which includes  automated exposure control, adjustment of the mA and/or kV according to patient size and/or use of iterative reconstruction technique. CONTRAST:  80mL OMNIPAQUE  IOHEXOL  350 MG/ML SOLN COMPARISON:  05/26/2023 FINDINGS: Despite efforts by the technologist and patient, motion artifact is present on today's exam and could not be eliminated. This reduces exam sensitivity and specificity. Cardiovascular: No filling defect is identified in the pulmonary arterial tree to suggest pulmonary embolus. Coronary, aortic arch, and branch vessel atherosclerotic vascular disease. Moderate to prominent cardiomegaly especially involving the right atrium. Pacer noted. Mediastinum/Nodes: Borderline enlarged AP window lymph node 1.0 cm in short axis on image 46 series 5, previously 0.9 cm. Right lower paratracheal node mildly enlarged 1.2 cm in short axis on image 41 series 5, formerly 1.0 cm. Similar  mildly prominent subcarinal lymph node. Lungs/Pleura: Chronic mosaic attenuation in the lungs. Scarring in the right middle lobe and along the minor fissure. Calcified pleural plaque along the diaphragm with pleural thickening laterally along the right lower lobe and right middle lobe similar to previous. Chronic volume loss and atelectasis in the lingula. Chronic lateral pleural thickening at the left lung base. Upper Abdomen: Abdominal aortic atherosclerosis. Musculoskeletal: Old rib deformities compatible with prior fractures. Suspected late phase healing fracture the right anterior sixth rib on image 84 series 13. Thoracic spondylosis. Review of the MIP images confirms the above findings. IMPRESSION: 1. No filling defect is identified in the pulmonary arterial tree to suggest pulmonary embolus. 2. Chronic mosaic attenuation in the lungs, possibly from small airways disease. 3. Chronic pleural thickening and calcified pleural plaque along the right diaphragm. 4. Moderate to prominent cardiomegaly especially involving the right atrium. 5.  Mildly enlarged mediastinal lymph nodes, nonspecific. 6. Suspected late phase healing fracture of the right anterior sixth rib. 7.  Aortic Atherosclerosis (ICD10-I70.0). Electronically Signed   By: Ryan Salvage M.D.   On: 06/13/2024 18:38   DG Chest Portable 1 View Result Date: 06/13/2024 CLINICAL DATA:  shortness of breath EXAM: PORTABLE CHEST - 1 VIEW COMPARISON:  October 03, 2023 FINDINGS: Bilateral perihilar interstitial opacities. No lobar consolidation or pneumothorax. Small right pleural effusion. Possible trace left pleural effusion. Mild cardiomegaly. Left chest pacemaker with leads terminating in the right ventricle. Tortuous aorta with aortic atherosclerosis. No acute fracture or destructive lesions. Multilevel thoracic osteophytosis. IMPRESSION: Bilateral perihilar interstitial opacities, which may represent interstitial edema or atypical/viral infection, in the correct clinical context. Electronically Signed   By: Rogelia Myers M.D.   On: 06/13/2024 17:10        Scheduled Meds:  amLODipine   10 mg Oral Daily   aspirin   81 mg Oral q morning   atorvastatin   40 mg Oral QHS   bisoprolol   10 mg Oral Daily   diclofenac  Sodium  2 g Topical QID   enoxaparin  (LOVENOX ) injection  40 mg Subcutaneous Q24H   furosemide   40 mg Oral q1600   furosemide   80 mg Oral Q breakfast   insulin  aspart  0-5 Units Subcutaneous QHS   insulin  aspart  0-6 Units Subcutaneous TID WC   methylPREDNISolone  (SOLU-MEDROL ) injection  125 mg Intravenous Q12H   pantoprazole   40 mg Oral QAC breakfast   sodium chloride  flush  3 mL Intravenous Q12H   tamsulosin   0.4 mg Oral QPC supper   Continuous Infusions:  cefTRIAXone  (ROCEPHIN )  IV 1 g (06/14/24 2206)     LOS: 1 day    Time spent: 45 minutes spent on chart review, discussion with nursing staff, consultants, updating family and interview/physical exam; more than 50% of that time was spent in counseling and/or coordination of care.    Harlene RAYMOND Bowl,  DO [facility] Available via Epic secure chat 7am-7pm After these hours, please refer to coverage provider listed on [email or web address] 06/15/2024, 10:27 AM

## 2024-06-15 NOTE — Plan of Care (Signed)

## 2024-06-16 DIAGNOSIS — J441 Chronic obstructive pulmonary disease with (acute) exacerbation: Secondary | ICD-10-CM | POA: Diagnosis not present

## 2024-06-16 LAB — CBC
HCT: 38.6 % — ABNORMAL LOW (ref 39.0–52.0)
Hemoglobin: 12 g/dL — ABNORMAL LOW (ref 13.0–17.0)
MCH: 29.1 pg (ref 26.0–34.0)
MCHC: 31.1 g/dL (ref 30.0–36.0)
MCV: 93.7 fL (ref 80.0–100.0)
Platelets: 263 K/uL (ref 150–400)
RBC: 4.12 MIL/uL — ABNORMAL LOW (ref 4.22–5.81)
RDW: 14.5 % (ref 11.5–15.5)
WBC: 7.5 K/uL (ref 4.0–10.5)
nRBC: 0 % (ref 0.0–0.2)

## 2024-06-16 LAB — BASIC METABOLIC PANEL WITH GFR
Anion gap: 11 (ref 5–15)
BUN: 26 mg/dL — ABNORMAL HIGH (ref 8–23)
CO2: 27 mmol/L (ref 22–32)
Calcium: 8.2 mg/dL — ABNORMAL LOW (ref 8.9–10.3)
Chloride: 102 mmol/L (ref 98–111)
Creatinine, Ser: 1 mg/dL (ref 0.61–1.24)
GFR, Estimated: >60 mL/min (ref >60)
Glucose, Bld: 196 mg/dL — ABNORMAL HIGH (ref 70–99)
Potassium: 4.1 mmol/L (ref 3.5–5.1)
Sodium: 141 mmol/L (ref 135–145)

## 2024-06-16 LAB — GLUCOSE, CAPILLARY
Glucose-Capillary: 206 mg/dL — ABNORMAL HIGH (ref 70–99)
Glucose-Capillary: 268 mg/dL — ABNORMAL HIGH (ref 70–99)
Glucose-Capillary: 230 mg/dL — ABNORMAL HIGH (ref 70–99)
Glucose-Capillary: 225 mg/dL — ABNORMAL HIGH (ref 70–99)

## 2024-06-16 MED ORDER — MORPHINE SULFATE 10 MG/5ML PO SOLN: 2.5000 mg | ORAL | Status: DC | PRN

## 2024-06-16 MED ORDER — FUROSEMIDE 10 MG/ML IJ SOLN
40.0000 mg | Freq: Two times a day (BID) | INTRAMUSCULAR | Status: DC
  Administered 2024-06-16 – 2024-06-21 (×10): 40 mg via INTRAVENOUS
  Filled 2024-06-16 (×10): qty 4

## 2024-06-16 MED ORDER — MORPHINE SULFATE (CONCENTRATE) 10 MG /0.5 ML PO SOLN
2.5000 mg | ORAL | Status: DC | PRN
  Administered 2024-06-19: 2.6 mg via ORAL
  Filled 2024-06-16: qty 0.5

## 2024-06-16 NOTE — Plan of Care (Signed)

## 2024-06-16 NOTE — Progress Notes (Signed)
 PROGRESS NOTE    Bradley [name]  FMW:[id number] DOB: December 11, 1940 DOA: 06/13/2024 PCP: Amon Aloysius BRAVO, MD    Brief Narrative:  Bradley JINNY Dalia Sr. is a 83 y.o. male with medical history significant for hypertension, type 2 diabetes mellitus, PAD, CAD, atrial fibrillation status post AV node ablation, not anticoagulated due to history of ICH, tachybradycardia syndrome with pacemaker, chronic HFpEF, COPD, and chronic hypoxic and hypercarbic respiratory failure who presents with cough and shortness of breath.   Patient reports worsening shortness of breath over the past few days, particularly with activity.  He has increased cough associated with this.  He denies any chest pain, fever, chills, or lower extremity swelling.   Assessment and Plan: COPD exacerbation; chronic hypoxic & hypercarbic respiratory failure  - Culture sputum not done yet - continue systemic steroid-- IV for now - Wean oxygen back to 5 L - Scheduled nebs and as needed -seems to be able to speak with less dyspnea   Chronic HFpEF with known pulm HTN - ? Volume overloaded-- IV lasix  -monitor output -echo pending -may need cards consult    Atrial fibrillation  - S/p AV nodal ablation; has not been anticoagulated d/t hx of ICH  - Continue bisoprolol      CAD  - No recent angina  - Continue ASA and Lipitor     Hypertension  - Norvasc  -- decrease dose    Type II DM  - A1c was 6.5% in August 2025     DVT prophylaxis: enoxaparin  (LOVENOX ) injection 40 mg Start: 06/14/24 1000    Code Status: Full Code Family Communication: no family at bedside today  Disposition Plan:  Level of care: Med-Surg        Subjective: Continued left-sided pain, now at rest and with repeat production with palpation  Objective: Vitals:   06/15/24 1549 06/15/24 2105 06/16/24 0415 06/16/24 1305  BP: 105/71 106/75 129/87 114/74  Pulse: 71 71 88 71  Resp: 17 (!) 24 20 20   Temp: 97.9 F (36.6 C) 97.9 F (36.6 C) 97.6 F (36.4 C)  97.9 F (36.6 C)  TempSrc: Oral Oral Oral Oral  SpO2: 99% 98% 95% 99%  Weight:      Height:        Intake/Output Summary (Last 24 hours) at 06/16/2024 1414 Last data filed at 06/16/2024 1307 Gross per 24 hour  Intake --  Output 225 ml  Net -225 ml   Filed Weights   06/14/24 0000  Weight: 70.8 kg    Examination:   General: Appearance:    Well developed, well nourished male with mild dyspnea     Lungs:   Appears to have mild work of breathing when speaking  Heart:    Normal heart rate.    MS:   All extremities are intact.    Neurologic:   Awake, alert-able to make needs known       Data Reviewed: I have personally reviewed following labs and imaging studies  CBC: Recent Labs  Lab 06/13/24 1624 06/14/24 0502 06/15/24 0420 06/16/24 0540  WBC 7.5 4.3 9.2 7.5  HGB 12.4* 12.1* 12.4* 12.0*  HCT 37.9* 36.9* 38.6* 38.6*  MCV 91.3 92.7 94.4 93.7  PLT 279 242 256 263   Basic Metabolic Panel: Recent Labs  Lab 06/13/24 1624 06/14/24 0502 06/15/24 0420 06/16/24 0540  NA 140 142 140 141  K 4.1 4.3 4.6 4.1  CL 101 105 103 102  CO2 29 27 26 27   GLUCOSE 104* 176*  187* 196*  BUN 13 14 21  26*  CREATININE 0.91 0.76 0.96 1.00  CALCIUM  8.3* 8.4* 8.5* 8.2*   GFR: Estimated Creatinine Clearance: 54.2 mL/min (by C-G formula based on SCr of 1 mg/dL). Liver Function Tests: No results for input(s): AST, ALT, ALKPHOS, BILITOT, PROT, ALBUMIN in the last 168 hours. No results for input(s): LIPASE, AMYLASE in the last 168 hours. No results for input(s): AMMONIA in the last 168 hours. Coagulation Profile: No results for input(s): INR, PROTIME in the last 168 hours. Cardiac Enzymes: No results for input(s): CKTOTAL, CKMB, CKMBINDEX, TROPONINI in the last 168 hours. BNP (last 3 results) Recent Labs    06/14/24 0502 06/15/24 1040  PROBNP 4,257.0* 6,826.0*   HbA1C: No results for input(s): HGBA1C in the last 72 hours. CBG: Recent Labs  Lab  06/15/24 1120 06/15/24 1647 06/15/24 2106 06/16/24 0728 06/16/24 1214  GLUCAP 173* 216* 181* 206* 268*   Lipid Profile: No results for input(s): CHOL, HDL, LDLCALC, TRIG, CHOLHDL, LDLDIRECT in the last 72 hours. Thyroid  Function Tests: No results for input(s): TSH, T4TOTAL, FREET4, T3FREE, THYROIDAB in the last 72 hours. Anemia Panel: No results for input(s): VITAMINB12, FOLATE, FERRITIN, TIBC, IRON, RETICCTPCT in the last 72 hours. Sepsis Labs: No results for input(s): PROCALCITON, LATICACIDVEN in the last 168 hours.  Recent Results (from the past 240 hours)  Resp panel by RT-PCR (RSV, Flu A&B, Covid) Anterior Nasal Swab     Status: None   Collection Time: 06/13/24  5:15 PM   Specimen: Anterior Nasal Swab  Result Value Ref Range Status   SARS Coronavirus 2 by RT PCR NEGATIVE NEGATIVE Final    Comment: (NOTE) SARS-CoV-2 target nucleic acids are NOT DETECTED.  The SARS-CoV-2 RNA is generally detectable in upper respiratory specimens during the acute phase of infection. The lowest concentration of SARS-CoV-2 viral copies this assay can detect is 138 copies/mL. A negative result does not preclude SARS-Cov-2 infection and should not be used as the sole basis for treatment or other patient management decisions. A negative result may occur with  improper specimen collection/handling, submission of specimen other than nasopharyngeal swab, presence of viral mutation(s) within the areas targeted by this assay, and inadequate number of viral copies(<138 copies/mL). A negative result must be combined with clinical observations, patient history, and epidemiological information. The expected result is Negative.  Fact Sheet for Patients:  BloggerCourse.com  Fact Sheet for Healthcare Providers:  SeriousBroker.it  This test is no t yet approved or cleared by the [place]  FDA and  has been  authorized for detection and/or diagnosis of SARS-CoV-2 by FDA under an Emergency Use Authorization (EUA). This EUA will remain  in effect (meaning this test can be used) for the duration of the COVID-19 declaration under Section 564(b)(1) of the Act, 21 U.S.C.section 360bbb-3(b)(1), unless the authorization is terminated  or revoked sooner.       Influenza A by PCR NEGATIVE NEGATIVE Final   Influenza B by PCR NEGATIVE NEGATIVE Final    Comment: (NOTE) The Xpert Xpress SARS-CoV-2/FLU/RSV plus assay is intended as an aid in the diagnosis of influenza from Nasopharyngeal swab specimens and should not be used as a sole basis for treatment. Nasal washings and aspirates are unacceptable for Xpert Xpress SARS-CoV-2/FLU/RSV testing.  Fact Sheet for Patients: BloggerCourse.com  Fact Sheet for Healthcare Providers: SeriousBroker.it  This test is not yet approved or cleared by the [place]  FDA and has been authorized for detection and/or diagnosis of SARS-CoV-2 by FDA under an Emergency  Use Authorization (EUA). This EUA will remain in effect (meaning this test can be used) for the duration of the COVID-19 declaration under Section 564(b)(1) of the Act, 21 U.S.C. section 360bbb-3(b)(1), unless the authorization is terminated or revoked.     Resp Syncytial Virus by PCR NEGATIVE NEGATIVE Final    Comment: (NOTE) Fact Sheet for Patients: BloggerCourse.com  Fact Sheet for Healthcare Providers: SeriousBroker.it  This test is not yet approved or cleared by the [place]  FDA and has been authorized for detection and/or diagnosis of SARS-CoV-2 by FDA under an Emergency Use Authorization (EUA). This EUA will remain in effect (meaning this test can be used) for the duration of the COVID-19 declaration under Section 564(b)(1) of the Act, 21 U.S.C. section 360bbb-3(b)(1), unless the  authorization is terminated or revoked.  Performed at [facility], [address] [place], [place] [address]   Respiratory (~20 pathogens) panel by PCR     Status: None   Collection Time: 06/14/24  8:57 AM   Specimen: Nasopharyngeal Swab; Respiratory  Result Value Ref Range Status   Adenovirus NOT DETECTED NOT DETECTED Final   Coronavirus 229E NOT DETECTED NOT DETECTED Final    Comment: (NOTE) The Coronavirus on the Respiratory Panel, DOES NOT test for the novel  Coronavirus (2019 nCoV)    Coronavirus HKU1 NOT DETECTED NOT DETECTED Final   Coronavirus NL63 NOT DETECTED NOT DETECTED Final   Coronavirus OC43 NOT DETECTED NOT DETECTED Final   Metapneumovirus NOT DETECTED NOT DETECTED Final   Rhinovirus / Enterovirus NOT DETECTED NOT DETECTED Final   Influenza A NOT DETECTED NOT DETECTED Final   Influenza B NOT DETECTED NOT DETECTED Final   Parainfluenza Virus 1 NOT DETECTED NOT DETECTED Final   Parainfluenza Virus 2 NOT DETECTED NOT DETECTED Final   Parainfluenza Virus 3 NOT DETECTED NOT DETECTED Final   Parainfluenza Virus 4 NOT DETECTED NOT DETECTED Final   Respiratory Syncytial Virus NOT DETECTED NOT DETECTED Final   Bordetella pertussis NOT DETECTED NOT DETECTED Final   Bordetella Parapertussis NOT DETECTED NOT DETECTED Final   Chlamydophila pneumoniae NOT DETECTED NOT DETECTED Final   Mycoplasma pneumoniae NOT DETECTED NOT DETECTED Final    Comment: Performed at [facility], [address], [place], [place] [address]         Radiology Studies: No results found.       Scheduled Meds:  aspirin   81 mg Oral q morning   atorvastatin   40 mg Oral QHS   bisoprolol   10 mg Oral Daily   diclofenac  Sodium  2 g Topical QID   enoxaparin  (LOVENOX ) injection  40 mg Subcutaneous Q24H   furosemide   40 mg Intravenous BID   insulin  aspart  0-5 Units Subcutaneous QHS   insulin  aspart  0-6 Units Subcutaneous TID WC   methylPREDNISolone  (SOLU-MEDROL ) injection   125 mg Intravenous Q12H   pantoprazole   40 mg Oral QAC breakfast   sodium chloride  flush  3 mL Intravenous Q12H   tamsulosin   0.4 mg Oral QPC supper   Continuous Infusions:  cefTRIAXone  (ROCEPHIN )  IV 1 g (06/15/24 2310)     LOS: 2 days    Time spent: 45 minutes spent on chart review, discussion with nursing staff, consultants, updating family and interview/physical exam; more than 50% of that time was spent in counseling and/or coordination of care.    Harlene RAYMOND Bowl, DO [facility] Available via Epic secure chat 7am-7pm After these hours, please refer to coverage provider listed  on [email or web address] 06/16/2024, 2:14 PM

## 2024-06-17 ENCOUNTER — Inpatient Hospital Stay (HOSPITAL_COMMUNITY)

## 2024-06-17 DIAGNOSIS — J441 Chronic obstructive pulmonary disease with (acute) exacerbation: Secondary | ICD-10-CM | POA: Diagnosis not present

## 2024-06-17 DIAGNOSIS — I5031 Acute diastolic (congestive) heart failure: Secondary | ICD-10-CM | POA: Diagnosis not present

## 2024-06-17 LAB — BASIC METABOLIC PANEL WITH GFR
Anion gap: 11 (ref 5–15)
BUN: 27 mg/dL — ABNORMAL HIGH (ref 8–23)
CO2: 28 mmol/L (ref 22–32)
Calcium: 8 mg/dL — ABNORMAL LOW (ref 8.9–10.3)
Chloride: 102 mmol/L (ref 98–111)
Creatinine, Ser: 0.88 mg/dL (ref 0.61–1.24)
GFR, Estimated: >60 mL/min (ref >60)
Glucose, Bld: 196 mg/dL — ABNORMAL HIGH (ref 70–99)
Potassium: 4 mmol/L (ref 3.5–5.1)
Sodium: 142 mmol/L (ref 135–145)

## 2024-06-17 LAB — ECHOCARDIOGRAM COMPLETE
AR max vel: 2.85 cm2
AV Area VTI: 2.66 cm2
AV Area mean vel: 2.69 cm2
AV Mean grad: 5 mmHg
AV Peak grad: 10.5 mmHg
Ao pk vel: 1.62 m/s
Area-P 1/2: 5.7 cm2
Calc EF: 53.9 %
Height: 68 in
MV M vel: 4.27 m/s
MV Peak grad: 72.9 mmHg
MV VTI: 2.79 cm2
S' Lateral: 3.8 cm
Single Plane A2C EF: 53.7 %
Single Plane A4C EF: 55.4 %
Weight: 2496 [oz_av]

## 2024-06-17 LAB — CBC
HCT: 39.3 % (ref 39.0–52.0)
Hemoglobin: 12.7 g/dL — ABNORMAL LOW (ref 13.0–17.0)
MCH: 30.3 pg (ref 26.0–34.0)
MCHC: 32.3 g/dL (ref 30.0–36.0)
MCV: 93.8 fL (ref 80.0–100.0)
Platelets: 259 K/uL (ref 150–400)
RBC: 4.19 MIL/uL — ABNORMAL LOW (ref 4.22–5.81)
RDW: 14.5 % (ref 11.5–15.5)
WBC: 6.8 K/uL (ref 4.0–10.5)
nRBC: 0 % (ref 0.0–0.2)

## 2024-06-17 LAB — GLUCOSE, CAPILLARY
Glucose-Capillary: 180 mg/dL — ABNORMAL HIGH (ref 70–99)
Glucose-Capillary: 255 mg/dL — ABNORMAL HIGH (ref 70–99)
Glucose-Capillary: 193 mg/dL — ABNORMAL HIGH (ref 70–99)
Glucose-Capillary: 271 mg/dL — ABNORMAL HIGH (ref 70–99)

## 2024-06-17 LAB — PROCALCITONIN: Procalcitonin: <0.1 ng/mL

## 2024-06-17 NOTE — Progress Notes (Signed)
 PROGRESS NOTE    Daelin Haste  FMW:[id number] DOB: [birth year] DOA: 06/13/2024 PCP: Amon Aloysius BRAVO, MD    Brief Narrative:  [name] is a 83 y.o. male with medical history significant for hypertension, type 2 diabetes mellitus, PAD, CAD, atrial fibrillation status post AV node ablation, not anticoagulated due to history of ICH, tachybradycardia syndrome with pacemaker, chronic HFpEF, COPD, and chronic hypoxic and hypercarbic respiratory failure who presents with cough and shortness of breath.   Patient reports worsening shortness of breath over the past few days, particularly with activity.  He has increased cough associated with this.  He denies any chest pain, fever, chills, or lower extremity swelling.   Assessment and Plan: COPD exacerbation; chronic hypoxic & hypercarbic respiratory failure  - Culture sputum not done yet - continue systemic steroid-- IV for now - Wean oxygen back to 5 L - Scheduled nebs and as needed -seems to be able to speak with less dyspnea   Chronic HFpEF with known pulm HTN - ? Volume overloaded-- IV lasix  -monitor output -echo pending still -may need cards consult    Atrial fibrillation  - S/p AV nodal ablation; has not been anticoagulated d/t hx of ICH  - Continue bisoprolol      CAD  - No recent angina  - Continue ASA and Lipitor     Hypertension  - Norvasc  -- decrease dose    Type II DM  - A1c was 6.5% in August 2025     DVT prophylaxis: enoxaparin  (LOVENOX ) injection 40 mg Start: 06/14/24 1000    Code Status: Full Code Family Communication: called [place] 9/6  Disposition Plan:  Level of care: Telemetry        Subjective: Continues with reproductive left-sided chest pain currently  Objective: Vitals:   06/16/24 1305 06/16/24 2045 06/17/24 0436 06/17/24 0759  BP: 114/74 113/71 128/89   Pulse: 71 70 90   Resp: 20 (!) 22 (!) 24   Temp: 97.9 F (36.6 C) 97.8 F (36.6 C) 98.2 F (36.8 C)   TempSrc: Oral Oral Oral    SpO2: 99% 100% (!) 84% 98%  Weight:      Height:        Intake/Output Summary (Last 24 hours) at 06/17/2024 1309 Last data filed at 06/17/2024 1140 Gross per 24 hour  Intake 920 ml  Output 2900 ml  Net -1980 ml   Filed Weights   06/14/24 0000  Weight: 70.8 kg    Examination:   General: Appearance:    Well developed, well nourished male with mild dyspnea     Lungs:   On nasal cannula, appears comfortable able to speak in complete sentences  Heart:    Normal heart rate.    MS:   All extremities are intact.    Neurologic:   Awake, alert-able to make needs known       Data Reviewed: I have personally reviewed following labs and imaging studies  CBC: Recent Labs  Lab 06/13/24 1624 06/14/24 0502 06/15/24 0420 06/16/24 0540 06/17/24 0509  WBC 7.5 4.3 9.2 7.5 6.8  HGB 12.4* 12.1* 12.4* 12.0* 12.7*  HCT 37.9* 36.9* 38.6* 38.6* 39.3  MCV 91.3 92.7 94.4 93.7 93.8  PLT 279 242 256 263 259   Basic Metabolic Panel: Recent Labs  Lab 06/13/24 1624 06/14/24 0502 06/15/24 0420 06/16/24 0540 06/17/24 0509  NA 140 142 140 141 142  K 4.1 4.3 4.6 4.1 4.0  CL 101 105 103 102 102  CO2  29 27 26 27 28   GLUCOSE 104* 176* 187* 196* 196*  BUN 13 14 21  26* 27*  CREATININE 0.91 0.76 0.96 1.00 0.88  CALCIUM  8.3* 8.4* 8.5* 8.2* 8.0*   GFR: Estimated Creatinine Clearance: 61.5 mL/min (by C-G formula based on SCr of 0.88 mg/dL). Liver Function Tests: No results for input(s): AST, ALT, ALKPHOS, BILITOT, PROT, ALBUMIN in the last 168 hours. No results for input(s): LIPASE, AMYLASE in the last 168 hours. No results for input(s): AMMONIA in the last 168 hours. Coagulation Profile: No results for input(s): INR, PROTIME in the last 168 hours. Cardiac Enzymes: No results for input(s): CKTOTAL, CKMB, CKMBINDEX, TROPONINI in the last 168 hours. BNP (last 3 results) Recent Labs    06/14/24 0502 06/15/24 1040  PROBNP 4,257.0* 6,826.0*   HbA1C: No  results for input(s): HGBA1C in the last 72 hours. CBG: Recent Labs  Lab 06/16/24 1214 06/16/24 1556 06/16/24 2045 06/17/24 0725 06/17/24 1140  GLUCAP 268* 230* 225* 180* 255*   Lipid Profile: No results for input(s): CHOL, HDL, LDLCALC, TRIG, CHOLHDL, LDLDIRECT in the last 72 hours. Thyroid  Function Tests: No results for input(s): TSH, T4TOTAL, FREET4, T3FREE, THYROIDAB in the last 72 hours. Anemia Panel: No results for input(s): VITAMINB12, FOLATE, FERRITIN, TIBC, IRON, RETICCTPCT in the last 72 hours. Sepsis Labs: Recent Labs  Lab 06/17/24 0509  PROCALCITON <0.10    Recent Results (from the past 240 hours)  Resp panel by RT-PCR (RSV, Flu A&B, Covid) Anterior Nasal Swab     Status: None   Collection Time: 06/13/24  5:15 PM   Specimen: Anterior Nasal Swab  Result Value Ref Range Status   SARS Coronavirus 2 by RT PCR NEGATIVE NEGATIVE Final    Comment: (NOTE) SARS-CoV-2 target nucleic acids are NOT DETECTED.  The SARS-CoV-2 RNA is generally detectable in upper respiratory specimens during the acute phase of infection. The lowest concentration of SARS-CoV-2 viral copies this assay can detect is 138 copies/mL. A negative result does not preclude SARS-Cov-2 infection and should not be used as the sole basis for treatment or other patient management decisions. A negative result may occur with  improper specimen collection/handling, submission of specimen other than nasopharyngeal swab, presence of viral mutation(s) within the areas targeted by this assay, and inadequate number of viral copies(<138 copies/mL). A negative result must be combined with clinical observations, patient history, and epidemiological information. The expected result is Negative.  Fact Sheet for Patients:  BloggerCourse.com  Fact Sheet for Healthcare Providers:  SeriousBroker.it  This test is no t yet approved or  cleared by the [place]  FDA and  has been authorized for detection and/or diagnosis of SARS-CoV-2 by FDA under an Emergency Use Authorization (EUA). This EUA will remain  in effect (meaning this test can be used) for the duration of the COVID-19 declaration under Section 564(b)(1) of the Act, 21 U.S.C.section 360bbb-3(b)(1), unless the authorization is terminated  or revoked sooner.       Influenza A by PCR NEGATIVE NEGATIVE Final   Influenza B by PCR NEGATIVE NEGATIVE Final    Comment: (NOTE) The Xpert Xpress SARS-CoV-2/FLU/RSV plus assay is intended as an aid in the diagnosis of influenza from Nasopharyngeal swab specimens and should not be used as a sole basis for treatment. Nasal washings and aspirates are unacceptable for Xpert Xpress SARS-CoV-2/FLU/RSV testing.  Fact Sheet for Patients: BloggerCourse.com  Fact Sheet for Healthcare Providers: SeriousBroker.it  This test is not yet approved or cleared by the [place]  FDA and has  been authorized for detection and/or diagnosis of SARS-CoV-2 by FDA under an Emergency Use Authorization (EUA). This EUA will remain in effect (meaning this test can be used) for the duration of the COVID-19 declaration under Section 564(b)(1) of the Act, 21 U.S.C. section 360bbb-3(b)(1), unless the authorization is terminated or revoked.     Resp Syncytial Virus by PCR NEGATIVE NEGATIVE Final    Comment: (NOTE) Fact Sheet for Patients: BloggerCourse.com  Fact Sheet for Healthcare Providers: SeriousBroker.it  This test is not yet approved or cleared by the [place]  FDA and has been authorized for detection and/or diagnosis of SARS-CoV-2 by FDA under an Emergency Use Authorization (EUA). This EUA will remain in effect (meaning this test can be used) for the duration of the COVID-19 declaration under Section 564(b)(1) of the Act, 21  U.S.C. section 360bbb-3(b)(1), unless the authorization is terminated or revoked.  Performed at [facility], [address] [place], [place] [address]   Respiratory (~20 pathogens) panel by PCR     Status: None   Collection Time: 06/14/24  8:57 AM   Specimen: Nasopharyngeal Swab; Respiratory  Result Value Ref Range Status   Adenovirus NOT DETECTED NOT DETECTED Final   Coronavirus 229E NOT DETECTED NOT DETECTED Final    Comment: (NOTE) The Coronavirus on the Respiratory Panel, DOES NOT test for the novel  Coronavirus (2019 nCoV)    Coronavirus HKU1 NOT DETECTED NOT DETECTED Final   Coronavirus NL63 NOT DETECTED NOT DETECTED Final   Coronavirus OC43 NOT DETECTED NOT DETECTED Final   Metapneumovirus NOT DETECTED NOT DETECTED Final   Rhinovirus / Enterovirus NOT DETECTED NOT DETECTED Final   Influenza A NOT DETECTED NOT DETECTED Final   Influenza B NOT DETECTED NOT DETECTED Final   Parainfluenza Virus 1 NOT DETECTED NOT DETECTED Final   Parainfluenza Virus 2 NOT DETECTED NOT DETECTED Final   Parainfluenza Virus 3 NOT DETECTED NOT DETECTED Final   Parainfluenza Virus 4 NOT DETECTED NOT DETECTED Final   Respiratory Syncytial Virus NOT DETECTED NOT DETECTED Final   Bordetella pertussis NOT DETECTED NOT DETECTED Final   Bordetella Parapertussis NOT DETECTED NOT DETECTED Final   Chlamydophila pneumoniae NOT DETECTED NOT DETECTED Final   Mycoplasma pneumoniae NOT DETECTED NOT DETECTED Final    Comment: Performed at [facility], [address], [place], [place] [address]         Radiology Studies: No results found.       Scheduled Meds:  aspirin   81 mg Oral q morning   atorvastatin   40 mg Oral QHS   bisoprolol   10 mg Oral Daily   diclofenac  Sodium  2 g Topical QID   enoxaparin  (LOVENOX ) injection  40 mg Subcutaneous Q24H   furosemide   40 mg Intravenous BID   insulin  aspart  0-5 Units Subcutaneous QHS   insulin  aspart  0-6 Units Subcutaneous TID WC    methylPREDNISolone  (SOLU-MEDROL ) injection  125 mg Intravenous Q12H   pantoprazole   40 mg Oral QAC breakfast   sodium chloride  flush  3 mL Intravenous Q12H   tamsulosin   0.4 mg Oral QPC supper   Continuous Infusions:  cefTRIAXone  (ROCEPHIN )  IV 1 g (06/16/24 2311)     LOS: 3 days    Time spent: 45 minutes spent on chart review, discussion with nursing staff, consultants, updating family and interview/physical exam; more than 50% of that time was spent in counseling and/or coordination of care.    Jasdeep Kepner U Deklyn Gibbon, DO [facility] Available via  Epic secure chat 7am-7pm After these hours, please refer to coverage provider listed on [email or web address] 06/17/2024, 1:09 PM

## 2024-06-18 ENCOUNTER — Ambulatory Visit (HOSPITAL_BASED_OUTPATIENT_CLINIC_OR_DEPARTMENT_OTHER): Admitting: Family

## 2024-06-18 DIAGNOSIS — I10 Essential (primary) hypertension: Secondary | ICD-10-CM | POA: Diagnosis not present

## 2024-06-18 DIAGNOSIS — I5033 Acute on chronic diastolic (congestive) heart failure: Secondary | ICD-10-CM | POA: Diagnosis not present

## 2024-06-18 DIAGNOSIS — I272 Pulmonary hypertension, unspecified: Secondary | ICD-10-CM

## 2024-06-18 DIAGNOSIS — R079 Chest pain, unspecified: Secondary | ICD-10-CM

## 2024-06-18 DIAGNOSIS — I4819 Other persistent atrial fibrillation: Secondary | ICD-10-CM

## 2024-06-18 DIAGNOSIS — I50810 Right heart failure, unspecified: Secondary | ICD-10-CM

## 2024-06-18 DIAGNOSIS — J441 Chronic obstructive pulmonary disease with (acute) exacerbation: Secondary | ICD-10-CM | POA: Diagnosis not present

## 2024-06-18 DIAGNOSIS — I495 Sick sinus syndrome: Secondary | ICD-10-CM

## 2024-06-18 LAB — GLUCOSE, CAPILLARY
Glucose-Capillary: 195 mg/dL — ABNORMAL HIGH (ref 70–99)
Glucose-Capillary: 315 mg/dL — ABNORMAL HIGH (ref 70–99)
Glucose-Capillary: 198 mg/dL — ABNORMAL HIGH (ref 70–99)
Glucose-Capillary: 230 mg/dL — ABNORMAL HIGH (ref 70–99)

## 2024-06-18 LAB — BASIC METABOLIC PANEL WITH GFR
Anion gap: 11 (ref 5–15)
BUN: 25 mg/dL — ABNORMAL HIGH (ref 8–23)
CO2: 30 mmol/L (ref 22–32)
Calcium: 7.7 mg/dL — ABNORMAL LOW (ref 8.9–10.3)
Chloride: 101 mmol/L (ref 98–111)
Creatinine, Ser: 0.72 mg/dL (ref 0.61–1.24)
GFR, Estimated: >60 mL/min (ref >60)
Glucose, Bld: 228 mg/dL — ABNORMAL HIGH (ref 70–99)
Potassium: 4 mmol/L (ref 3.5–5.1)
Sodium: 142 mmol/L (ref 135–145)

## 2024-06-18 NOTE — Consult Note (Addendum)
 Cardiology Consultation   Patient ID: Bradley PARTCH Sr. MRN: [id number]; DOB: [birth year]  Admit date: 06/13/2024 Date of Consult: 06/18/2024  PCP:  Bradley Bradley BRAVO, MD   [facility] Providers Cardiologist:  None  Electrophysiologist:  Bradley Birmingham, MD     Patient Profile: Bradley [name]. is a 83 y.o. male with a hx of CAD, PAD, PAF s/p ablation, no anticoagulation due to hx of ICH, SSS s/p PPM in place, HFpEF, COPD, DM2, HTN, chronic hypoxic and hypercarbic respiratory failure who is being seen 06/18/2024 for the evaluation of cough, SOB, and ?CHF exacerbation at the request of Dr. Juvenal.  History of Present Illness:  [name] underwent left heart catheterization in 2005 and 2018 with no significant coronary artery disease.  He had a BiV pacemaker and AV nodal ablation 08/2011.  At that time had a reduced systolic function in the setting of A-fib with RVR.  With ablation and BiV PPM, EF improved to 50-55% in 2017.  Echocardiogram in 2018 in the setting of COPD exacerbation with acute on chronic diastolic heart failure showed preserved LVEF 55 to 60%, mild MR, PASP 55 mmHg.  He has since had repeat hospitalizations for COPD exacerbation in 2020 and 2022.  He had PPM GEN change 10/2022 for EOL. He was hospitalized 06/2023 with lower extremity cellulitis and bacteremia with Pasteurella.  He was last seen by general cardiology Dr. Raford 09/2023 and EP 03/2024. He has persistent Afib with BiV pacing.  [facility]/S concerning for COPD exacerbation and acute on chronic diastolic heart failure.  Echo repeated yesterday 06/17/2024 and showed an LVEF 50-55%, mildly reduced RV systolic function, enlarged RV with severely elevated PASP with estimated RVSP 63.2 mmHg.  Severe BAE, trivial MR, mild to moderate TR.  Cardiology was consulted for CHF exacerbation. He has received IV lasix  with good response. Amlodipine  was discontinued, 10 mg bisoprolol  continued.   80 mg IV lasix  x  4 doses 40 mg IV lasix  x 5 doses  2.2 L urine output yesterday, overall net negative 4.7 L.  Weights not up to date.   He still appears fairly short of breath sitting upright and trying to talk.  He cannot lay flat without getting significantly short of breath.  He has been having some intermittent chest discomfort as well but he says has been occurring for months to years and is a poor historian when it comes to describing his chest pain.  No lower extremity edema, dizziness, palpitations, syncope.   Past Medical History:  Diagnosis Date   Abnormal CT scan, chest 2012   CT chest, several lymphadenopathies. Sees pulmonary   Anemia    intermittent   Arthritis    ?back (09/19/2018)   Atrial fibrillation (HCC)    s/p AV node ablation & BiV PPM implantation 09/08/11 (op dictation pending)   BPH (benign prostatic hyperplasia)    Saw Dr Amiel 2004, normal renal u/s   Bronchitis 08/26/2017   CAD (coronary artery disease)    CHF (congestive heart failure) (HCC)    Thought primarily to be non-systolic although EF down (EF 54-49% 12/2010, down to 35-40% 09/05/11), cath 2008 with no CAD, nuclear study 07/2011 showing Small area of reversibility in the distal ant/lat wall the left ventricle suspicious for ischemia/septal wall HK but felt to be low risk  (per D/C Summary 07/2011)   Chronic bronchitis (HCC)    get it ~ q yr   Chronic diastolic heart failure (HCC)  Chronic respiratory failure with hypoxia and hypercapnia (HCC) 09/14/2010   Followed in Pulmonary clinic/ [facility]/ [facility]  Started on 02 2lpm at discharge 09/23/11   - PFT's 10/28/2011  FEV1  1.40 (51%)  with ratio 70 and no better p B2 and DLCO 53 corrects to 101    - PFT's 10/18/2014    FEV1 1.71 (59%) with ratio 68 and no sign change p B2 and DLCO 53 corrects to 85  -  HC03   07/28/20  = 33  -  HCO3   03/17/21     = 31     Heart murmur    HLD (hyperlipidemia)    HTN (hypertension)    ICB (intracranial bleed) (HCC)  06/2012   d/c coumadin  permanently   Insomnia    Migraines    very very rare   On home oxygen therapy    2L; 24/7 (09/19/2018)   Pacemaker    Peripheral vascular disease (HCC)    ??   Pleural effusion 2008   S/p decortication   Pneumonia 08/02/2011   Pulmonary HTN (HCC)    per cath 2008   Type II diabetes mellitus (HCC) 1999    Past Surgical History:  Procedure Laterality Date   BI-VENTRICULAR PACEMAKER INSERTION Left 09/08/2011   Procedure: BI-VENTRICULAR PACEMAKER INSERTION (CRT-P);  Surgeon: Bradley LELON Birmingham, MD;  Location: [facility] CATH LAB;  Service: Cardiovascular;  Laterality: Left;   BIV PACEMAKER GENERATOR CHANGEOUT N/A 11/10/2022   Procedure: BIV PACEMAKER GENERATOR CHANGEOUT;  Surgeon: [name] Bradley LELON, MD;  Location: [facility] INVASIVE CV LAB;  Service: Cardiovascular;  Laterality: N/A;   CARDIAC CATHETERIZATION     couple times; never had balloon or stent (09/19/2018)   CATARACT EXTRACTION W/ INTRAOCULAR LENS  IMPLANT, BILATERAL Bilateral 08/2018   COLONOSCOPY  03/10/11   normal   INSERT / REPLACE / REMOVE PACEMAKER  09/08/11   pacemaker placement   LUNG DECORTICATION     PLEURAL SCARIFICATION     pneumothorax with fibrothorax  ~ 2010   RIGHT/LEFT HEART CATH AND CORONARY ANGIOGRAPHY N/A 10/05/2022   Procedure: RIGHT/LEFT HEART CATH AND CORONARY ANGIOGRAPHY;  Surgeon: Dann Candyce RAMAN, MD;  Location: [facility] INVASIVE CV LAB;  Service: Cardiovascular;  Laterality: N/A;   TONSILLECTOMY     as a kid      Home Medications:  Prior to Admission medications   Medication Sig Start Date End Date Taking? Authorizing Provider  acetaminophen  (TYLENOL ) 500 MG tablet Take 1,000 mg by mouth every 8 (eight) hours as needed for headache or moderate pain.   Yes [provider]  amLODipine  (NORVASC ) 10 MG tablet Take 1 tablet (10 mg total) by mouth daily. 05/23/24  Yes Bradley [name], Bradley BRAVO, MD  aspirin  81 MG tablet Take 81 mg by mouth every morning.    Yes [provider]   atorvastatin  (LIPITOR) 40 MG tablet TAKE 1 TABLET AT BEDTIME 11/03/23  Yes Bradley [name] Riggs, MD  bisoprolol  (ZEBETA ) 10 MG tablet Take 1 tablet (10 mg total) by mouth daily. 05/01/24  Yes Bradley [name], Bradley BRAVO, MD  calcium -vitamin D  (OSCAL WITH D) 500-200 MG-UNIT tablet Take 1 tablet by mouth every morning.   Yes [provider]  cholecalciferol  (VITAMIN D3) 25 MCG (1000 UNIT) tablet Take 1,000 Units by mouth every evening.   Yes [provider]  dapagliflozin  propanediol (FARXIGA ) 10 MG TABS tablet Take 1 tablet (10 mg total) by mouth daily before breakfast. 03/06/24  Yes Bradley [name], Jose E, MD  diphenhydramine -acetaminophen  (TYLENOL  PM) 25-500  MG TABS tablet Take 1 tablet by mouth at bedtime.   Yes [provider]  furosemide  (LASIX ) 40 MG tablet TAKE 2 TABLETS EVERY MORNING AND 1 TABLET LATE IN THE AFTERNOON Patient taking differently: Take 20-40 mg by mouth See admin instructions. TAKE 2 TABLETS EVERY MORNING AND 1 TABLET LATE IN THE AFTERNOON 08/19/23  Yes Bradley [name] Riggs, MD  Ipratropium-Albuterol  (COMBIVENT  RESPIMAT) 20-100 MCG/ACT AERS respimat Inhale 1 puff into the lungs every 6 (six) hours as needed for wheezing or shortness of breath. 10/11/23  Yes Bradley [name], Bradley BRAVO, MD  ipratropium-albuterol  (DUONEB) 0.5-2.5 (3) MG/3ML SOLN Take 3 mLs by nebulization every 6 (six) hours as needed (for shortness of breath). 10/11/23  Yes Bradley [name], Jose E, MD  Multiple Vitamins-Minerals (MULTIVITAMINS THER. W/MINERALS) TABS tablet Take 1 tablet by mouth daily. 09/14/13  Yes Bradley [name], Bradley BRAVO, MD  nitroGLYCERIN  (NITROSTAT ) 0.4 MG SL tablet Place 1 tablet (0.4 mg total) under the tongue every 5 (five) minutes x 3 doses as needed for chest pain. 09/07/21  Yes Bradley [name], Jose E, MD  OXYGEN Inhale 5 L/min into the lungs continuous.   Yes [provider]  pantoprazole  (PROTONIX ) 40 MG tablet Take 1 tablet (40 mg total) by mouth daily before breakfast. 05/23/24  Yes Bradley [name], Jose E, MD  tamsulosin  (FLOMAX ) 0.4 MG CAPS  capsule Take 1 capsule (0.4 mg total) by mouth daily after supper. 05/15/24  Yes Bradley [name], Bradley BRAVO, MD  glucose blood (ONETOUCH VERIO) test strip USE TO CHECK BLOOD SUGAR NO MORE THAN TWICE A DAY 03/28/23   Bradley Bradley BRAVO, MD  Lancets [facility] ULTRASOFT) lancets Check blood sugars no more than twice daily 03/08/17   Bradley [name], Jose E, MD    Scheduled Meds:  aspirin   81 mg Oral q morning   atorvastatin   40 mg Oral QHS   bisoprolol   10 mg Oral Daily   diclofenac  Sodium  2 g Topical QID   enoxaparin  (LOVENOX ) injection  40 mg Subcutaneous Q24H   furosemide   40 mg Intravenous BID   insulin  aspart  0-5 Units Subcutaneous QHS   insulin  aspart  0-6 Units Subcutaneous TID WC   methylPREDNISolone  (SOLU-MEDROL ) injection  125 mg Intravenous Q12H   pantoprazole   40 mg Oral QAC breakfast   sodium chloride  flush  3 mL Intravenous Q12H   tamsulosin   0.4 mg Oral QPC supper   Continuous Infusions:  PRN Meds: acetaminophen  **OR** acetaminophen , guaiFENesin , ipratropium-albuterol , morphine  CONCENTRATE, senna-docusate  Allergies:    Allergies  Allergen Reactions   Hydrocodone  Other (See Comments)    given to him in the hospital; went thru withdrawals once home; dr said not to take it again (09/27/2012) dizziness   Ultram  [Tramadol ] Other (See Comments)    given to him in the hospital; went thru withdrawals once home; dr said not to take it again (09/27/2012) dizziness   Cialis [Tadalafil] Other (See Comments)    Headache Backache  Dizziness    Avelox  [Moxifloxacin  Hydrochloride] Itching and Other (See Comments)    Headache Dizziness    Social History:   Social History   Socioeconomic History   Marital status: Widowed    Spouse name: Virginia  Mclester   Number of children: 1   Years of education: Not on file   Highest education level: Not on file  Occupational History   Occupation: retired Publishing rights manager: RETIRED  Tobacco Use   Smoking status: Former    Current packs/day: 0.00     Average packs/day: 2.5 packs/day  for 25.0 years (62.5 ttl pk-yrs)    Types: Cigarettes, Cigars    Start date: 10/12/1955    Quit date: 10/11/1980    Years since quitting: 43.7   Smokeless tobacco: Never  Vaping Use   Vaping status: Never Used  Substance and Sexual Activity   Alcohol use: Not Currently    Comment: 1 can of beer ever 4-5 months   Drug use: No   Sexual activity: Not Currently  Other Topics Concern   Not on file  Social History Narrative   Moved from [place]..   Wife, Virginia  passed away 2023/08/15   1 son, substance abuse, passed away 10-14-16   Household: pt, Warden/ranger, her child and her husband    Still drives as off 05/2024         Social Drivers of Health   Financial Resource Strain: Low Risk  (04/11/2024)   Overall Financial Resource Strain (CARDIA)    Difficulty of Paying Living Expenses: Not hard at all  Food Insecurity: No Food Insecurity (06/13/2024)   Hunger Vital Sign    Worried About Running Out of Food in the Last Year: Never true    Ran Out of Food in the Last Year: Never true  Transportation Needs: No Transportation Needs (06/13/2024)   PRAPARE - Administrator, Civil Service (Medical): No    Lack of Transportation (Non-Medical): No  Physical Activity: Insufficiently Active (04/11/2024)   Exercise Vital Sign    Days of Exercise per Week: 4 days    Minutes of Exercise per Session: 20 min  Stress: No Stress Concern Present (04/11/2024)   [employer] of Occupational Health - Occupational Stress Questionnaire    Feeling of Stress: Not at all  Social Connections: Moderately Isolated (06/13/2024)   Social Connection and Isolation Panel    Frequency of Communication with Friends and Family: More than three times a week    Frequency of Social Gatherings with Friends and Family: More than three times a week    Attends Religious Services: 1 to 4 times per year    Active Member of [employer] or Organizations: No    Attends Banker Meetings:  Never    Marital Status: Widowed  Intimate Partner Violence: Not At Risk (06/13/2024)   Humiliation, Afraid, Rape, and Kick questionnaire    Fear of Current or Ex-Partner: No    Emotionally Abused: No    Physically Abused: No    Sexually Abused: No    Family History:    Family History  Problem Relation Age of Onset   Diabetes Father    Coronary artery disease Father    Polycythemia Mother    Drug abuse Son    Breast cancer Maternal Aunt    Prostate cancer Neg Hx    Colon cancer Neg Hx      ROS:  Please see the history of present illness.   All other ROS reviewed and negative.     Physical Exam/Data: Vitals:   06/17/24 1347 06/17/24 1937 06/18/24 0418 06/18/24 1523  BP: 129/77 (!) 120/95 119/83 135/85  Pulse: 70 70 75 73  Resp: 20 18 17  (!) 22  Temp: (!) 97.4 F (36.3 C) (!) 97.4 F (36.3 C) 97.9 F (36.6 C) 98 F (36.7 C)  TempSrc: Oral Oral Oral Oral  SpO2: 96% 98% 97% 99%  Weight:      Height:        Intake/Output Summary (Last 24 hours) at 06/18/2024 1552 Last data filed  at 06/18/2024 1000 Gross per 24 hour  Intake 270 ml  Output 1900 ml  Net -1630 ml      06/14/2024   12:00 AM 05/23/2024    9:10 AM 04/11/2024    1:51 PM  Last 3 Weights  Weight (lbs) 156 lb 172 lb 4 oz 185 lb  Weight (kg) 70.761 kg 78.132 kg 83.915 kg     Body mass index is 23.72 kg/m.  General:  Well nourished, well developed, in mild respiratory distress secondary to shortness of breath HEENT: normal Neck: no JVD Vascular: No carotid bruits; Distal pulses 2+ bilaterally Cardiac:  normal S1, S2; RRR; no murmur  Lungs:  clear to auscultation bilaterally, no wheezing, rhonchi or rales  Abd: soft, nontender, no hepatomegaly  Ext: no edema Musculoskeletal:  No deformities, BUE and BLE strength normal and equal Skin: warm and dry  Neuro:  CNs 2-12 intact, no focal abnormalities noted Psych:  Normal affect   EKG:  The EKG was personally reviewed and demonstrates: Atrial fibrillation with  ventricular paced rhythm and PVCs Telemetry:  Telemetry was personally reviewed and demonstrates: Atrial fibrillation with ventricular pacing  Relevant CV Studies:  Echo 06/17/24:  1. Left ventricular ejection fraction, by estimation, is 50 to 55%. The  left ventricle has low normal function. Left ventricular endocardial  border not optimally defined to evaluate regional wall motion. Left  ventricular diastolic parameters are  indeterminate.   2. Right ventricular systolic function is mildly reduced. The right  ventricular size is severely enlarged. There is severely elevated  pulmonary artery systolic pressure. The estimated right ventricular  systolic pressure is 63.2 mmHg.   3. Left atrial size was severely dilated.   4. Right atrial size was severely dilated.   5. The mitral valve is normal in structure. Trivial mitral valve  regurgitation. No evidence of mitral stenosis.   6. The tricuspid valve is abnormal. Tricuspid valve regurgitation is mild  to moderate.   7. The aortic valve is tricuspid. Aortic valve regurgitation is not  visualized. Aortic valve sclerosis/calcification is present, without any  evidence of aortic stenosis. Aortic valve mean gradient measures 5.0 mmHg.   8. The inferior vena cava is dilated in size with <50% respiratory  variability, suggesting right atrial pressure of 15 mmHg.   Laboratory Data: High Sensitivity Troponin:  No results for input(s): TROPONINIHS in the last 720 hours.   Chemistry Recent Labs  Lab 06/16/24 0540 06/17/24 0509 06/18/24 0425  NA 141 142 142  K 4.1 4.0 4.0  CL 102 102 101  CO2 27 28 30   GLUCOSE 196* 196* 228*  BUN 26* 27* 25*  CREATININE 1.00 0.88 0.72  CALCIUM  8.2* 8.0* 7.7*  GFRNONAA >60 >60 >60  ANIONGAP 11 11 11     No results for input(s): PROT, ALBUMIN, AST, ALT, ALKPHOS, BILITOT in the last 168 hours. Lipids No results for input(s): CHOL, TRIG, HDL, LABVLDL, LDLCALC, CHOLHDL in the last  168 hours.  Hematology Recent Labs  Lab 06/15/24 0420 06/16/24 0540 06/17/24 0509  WBC 9.2 7.5 6.8  RBC 4.09* 4.12* 4.19*  HGB 12.4* 12.0* 12.7*  HCT 38.6* 38.6* 39.3  MCV 94.4 93.7 93.8  MCH 30.3 29.1 30.3  MCHC 32.1 31.1 32.3  RDW 14.4 14.5 14.5  PLT 256 263 259   Thyroid  No results for input(s): TSH, FREET4 in the last 168 hours.  BNP Recent Labs  Lab 06/14/24 0502 06/15/24 1040  PROBNP 4,257.0* 6,826.0*    DDimer No results for  input(s): DDIMER in the last 168 hours.  Radiology/Studies:  ECHOCARDIOGRAM COMPLETE Result Date: 06/17/2024    ECHOCARDIOGRAM REPORT   Patient Name:   KANNEN MOXEY Sr. Date of Exam: 06/17/2024 Medical Rec #:  [id number]         Height:       68.0 in Accession #:    [id number]        Weight:       156.0 lb Date of Birth:  1941/09/21          BSA:          1.839 m Patient Age:    83 years          BP:           128/89 mmHg Patient Gender: M                 HR:           72 bpm. Exam Location:  Inpatient Procedure: 2D Echo, Cardiac Doppler and Color Doppler (Both Spectral and Color            Flow Doppler were utilized during procedure). Indications:    CHF I50.31  History:        Patient has prior history of Echocardiogram examinations, most                 recent 07/10/2023. CHF, CAD, Pacemaker, COPD, Arrythmias:Atrial                 Fibrillation; Risk Factors:Diabetes, Dyslipidemia and                 Hypertension.  Sonographer:    AS Referring Phys: [id number] JESSICA U VANN IMPRESSIONS  1. Left ventricular ejection fraction, by estimation, is 50 to 55%. The left ventricle has low normal function. Left ventricular endocardial border not optimally defined to evaluate regional wall motion. Left ventricular diastolic parameters are indeterminate.  2. Right ventricular systolic function is mildly reduced. The right ventricular size is severely enlarged. There is severely elevated pulmonary artery systolic pressure. The estimated right ventricular systolic pressure  is 63.2 mmHg.  3. Left atrial size was severely dilated.  4. Right atrial size was severely dilated.  5. The mitral valve is normal in structure. Trivial mitral valve regurgitation. No evidence of mitral stenosis.  6. The tricuspid valve is abnormal. Tricuspid valve regurgitation is mild to moderate.  7. The aortic valve is tricuspid. Aortic valve regurgitation is not visualized. Aortic valve sclerosis/calcification is present, without any evidence of aortic stenosis. Aortic valve mean gradient measures 5.0 mmHg.  8. The inferior vena cava is dilated in size with <50% respiratory variability, suggesting right atrial pressure of 15 mmHg. FINDINGS  Left Ventricle: Left ventricular ejection fraction, by estimation, is 50 to 55%. The left ventricle has low normal function. Left ventricular endocardial border not optimally defined to evaluate regional wall motion. Strain was performed and the global longitudinal strain is indeterminate. The left ventricular internal cavity size was normal in size. There is no left ventricular hypertrophy. Left ventricular diastolic parameters are indeterminate. Right Ventricle: The right ventricular size is severely enlarged. No increase in right ventricular wall thickness. Right ventricular systolic function is mildly reduced. There is severely elevated pulmonary artery systolic pressure. The tricuspid regurgitant velocity is 3.47 m/s, and with an assumed right atrial pressure of 15 mmHg, the estimated right ventricular systolic pressure is 63.2 mmHg. Left Atrium: Left atrial size was severely dilated. Right Atrium: Right atrial  size was severely dilated. Pericardium: There is no evidence of pericardial effusion. Mitral Valve: The mitral valve is normal in structure. Trivial mitral valve regurgitation. No evidence of mitral valve stenosis. MV peak gradient, 7.4 mmHg. The mean mitral valve gradient is 2.0 mmHg. Tricuspid Valve: The tricuspid valve is abnormal. Tricuspid valve  regurgitation is mild to moderate. No evidence of tricuspid stenosis. Aortic Valve: The aortic valve is tricuspid. Aortic valve regurgitation is not visualized. Aortic valve sclerosis/calcification is present, without any evidence of aortic stenosis. Aortic valve mean gradient measures 5.0 mmHg. Aortic valve peak gradient measures 10.5 mmHg. Aortic valve area, by VTI measures 2.66 cm. Pulmonic Valve: The pulmonic valve was not well visualized. Pulmonic valve regurgitation is trivial. No evidence of pulmonic stenosis. Aorta: The aortic root and ascending aorta are structurally normal, with no evidence of dilitation. Venous: The inferior vena cava is dilated in size with less than 50% respiratory variability, suggesting right atrial pressure of 15 mmHg. IAS/Shunts: No atrial level shunt detected by color flow Doppler. Additional Comments: 3D was performed not requiring image post processing on an independent workstation and was indeterminate. A device lead is visualized.  LEFT VENTRICLE PLAX 2D LVIDd:         4.80 cm LVIDs:         3.80 cm LV PW:         0.80 cm LV IVS:        0.90 cm LVOT diam:     2.30 cm LV SV:         83 LV SV Index:   45 LVOT Area:     4.15 cm  LV Volumes (MOD) LV vol d, MOD A2C: 125.0 ml LV vol d, MOD A4C: 117.0 ml LV vol s, MOD A2C: 57.9 ml LV vol s, MOD A4C: 52.2 ml LV SV MOD A2C:     67.1 ml LV SV MOD A4C:     117.0 ml LV SV MOD BP:      66.9 ml RIGHT VENTRICLE            IVC RV Basal diam:  5.10 cm    IVC diam: 2.90 cm RV S prime:     9.03 cm/s TAPSE (M-mode): 1.7 cm LEFT ATRIUM             Index        RIGHT ATRIUM           Index LA diam:        4.80 cm 2.61 cm/m   RA Area:     30.50 cm LA Vol (A2C):   54.0 ml 29.36 ml/m  RA Volume:   95.40 ml  51.88 ml/m LA Vol (A4C):   86.8 ml 47.20 ml/m LA Biplane Vol: 68.4 ml 37.20 ml/m  AORTIC VALVE                     PULMONIC VALVE AV Area (Vmax):    2.85 cm      PV Vmax:          0.86 m/s AV Area (Vmean):   2.69 cm      PV Peak grad:      2.9 mmHg AV Area (VTI):     2.66 cm      PR End Diast Vel: 5.24 msec AV Vmax:           162.00 cm/s AV Vmean:          103.000 cm/s AV VTI:  0.311 m AV Peak Grad:      10.5 mmHg AV Mean Grad:      5.0 mmHg LVOT Vmax:         111.00 cm/s LVOT Vmean:        66.700 cm/s LVOT VTI:          0.199 m LVOT/AV VTI ratio: 0.64  AORTA Ao Root diam: 3.40 cm Ao Asc diam:  3.30 cm MITRAL VALVE                TRICUSPID VALVE MV Area (PHT): 5.70 cm     TR Peak grad:   48.2 mmHg MV Area VTI:   2.79 cm     TR Vmax:        347.00 cm/s MV Peak grad:  7.4 mmHg MV Mean grad:  2.0 mmHg     SHUNTS MV Vmax:       1.36 m/s     Systemic VTI:  0.20 m MV Vmean:      62.0 cm/s    Systemic Diam: 2.30 cm MV Decel Time: 133 msec MR Peak grad: 72.9 mmHg MR Vmax:      427.00 cm/s MV E velocity: 121.00 cm/s MV A velocity: 39.30 cm/s MV E/A ratio:  3.08 Vishnu Priya Mallipeddi Electronically signed by Diannah Late Mallipeddi Signature Date/Time: 06/17/2024/2:23:07 PM    Final      Assessment and Plan:  Acute on chronic diastolic heart failure  Mild RV dysfunction Pulmonary hypertension COPD Chronic respiratory failure on home O2 - echo this admission with low normal LVEF 50-55%, but elevated PASP at 63 mmHg, mild RV dysfunction, severe RV enlargement, severe BAE with mild to moderate TR and RAP estimated 15 mmHg -2D echo a year ago showed essentially the same EF at 50 to 55% with severe BAE at that time as well.  Same valvular regurgitant lesions were noted at that time.  It was not an adequate study to assess RV function RV size.  Patient did have evidence of pulmonary hypertension at that time as well with a PASP estimated at 56 mmHg. -Cardiac cath 09/29/2022 showed EF 50 to 55% with normal LVEDP and hemodynamics consistent with mild PHTN and minimal nonobstructive CAD.  Suspect his pulmonary hypertension is a combination of group 3 from his COPD as well as group 2 from pulmonary venous hypertension. 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 -Recommend daily weights, strict I's and O's and renal function while diuresing -Recommend outpatient split-night sleep study -No PE on chest CT this admission -Would benefit from outpatient referral to advanced heart failure clinic   Persistent Afib SSS s/p PPM - History of prior ablation - no OAC due to ICH history - Heart rate adequately controlled in atrial fibrillation - Continue bisoprolol  10 mg daily    Hypertension -  BP Controlled on exam today - Home dose of amlodipine  held to allow for more blood pressure room for titration of heart failure meds and beta-blocker for A-fib - Continue bisoprolol  10 mg daily  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 - Currently receiving systemic steroids IV - O2 at 5 L - Continue as needed nebulizers - Still short of breath when talking  Chest pain - Suspect this is more pleuritic related to his COPD exacerbation he says he has been having it for years - High-sensitivity troponin T <15 - 2D echo with unchanged EF 50 to 55% - Cardiac cath in 09/29/2022 showed Essentially normal coronary arteries with minimal luminal irregularities - No further ischemic workup at this time but if he continues to have episodes of chest pain once COPD exacerbation  is resolved   Risk Assessment/Risk Scores:       [place]  Heart Association (NYHA) Functional Class NYHA Class III  CHA2DS2-VASc Score = 5   This indicates a 7.2% annual risk of stroke. The patient's score is based upon: CHF History: 1 HTN History: 1 Diabetes History: 1 Stroke History: 0 Vascular Disease History: 0 Age Score: 2 Gender Score: 0        For questions or updates, please contact [facility] Please consult [email or web address] for contact info under    Signed, Wilbert Bihari, MD  06/18/2024 3:52 PM

## 2024-06-18 NOTE — Plan of Care (Signed)

## 2024-06-18 NOTE — Progress Notes (Signed)
 Mobility Specialist - Progress Note  Pre-mobility: 73 bpm HR, 99% SpO2 ([place] 5L) During mobility: 91 bpm HR, 91% SpO2 ([place] 4L) Post-mobility: 87 bpm HR, 94% SPO2 ([place] 4L)    06/18/24 1435  Mobility  Activity Ambulated with assistance  Level of Assistance Modified independent, requires aide device or extra time  Assistive Device Front wheel walker  Distance Ambulated (ft) 300 ft  Range of Motion/Exercises Active  Activity Response Tolerated well  Mobility Referral Yes  Mobility visit 1 Mobility  Mobility Specialist Start Time (ACUTE ONLY) 1415  Mobility Specialist Stop Time (ACUTE ONLY) 1435  Mobility Specialist Time Calculation (min) (ACUTE ONLY) 20 min   Pt was found in bed and agreeable to ambulate. C/o SOB during ambulation. At EOS returned to recliner chair with all needs met. Call bell in reach.  Erminio Leos,  Mobility Specialist Can be reached via Secure Chat

## 2024-06-18 NOTE — Plan of Care (Signed)

## 2024-06-18 NOTE — Progress Notes (Signed)
 PROGRESS NOTE    Bradley [name]  FMW:[id number] DOB: Nov 22, 1940 DOA: 06/13/2024 PCP: Amon Aloysius BRAVO, MD    Brief Narrative:  Bradley JINNY Dalia Sr. is a 83 y.o. male with medical history significant for hypertension, type 2 diabetes mellitus, PAD, CAD, atrial fibrillation status post AV node ablation, not anticoagulated due to history of ICH, tachybradycardia syndrome with pacemaker, chronic HFpEF, COPD, and chronic hypoxic and hypercarbic respiratory failure who presents with cough and shortness of breath.   Patient reports worsening shortness of breath over the past few days, particularly with activity.  He has increased cough associated with this.  He denies any chest pain, fever, chills, or lower extremity swelling.   Assessment and Plan: COPD exacerbation; chronic hypoxic & hypercarbic respiratory failure  - continue systemic steroid-- IV for now - Wean oxygen back to 5 L - Scheduled nebs and as needed -seems to be able to speak with less dyspnea   Chronic HFpEF with known pulm HTN - ? Volume overloaded-- IV lasix  with good results -monitor output -echo similar to prior - cards consult   Chest pain -appears somewhat reproductive -will nee cards consult -troponin negative   Atrial fibrillation  - S/p AV nodal ablation; has not been anticoagulated d/t hx of ICH  - Continue bisoprolol      CAD  - No recent angina  - Continue ASA and Lipitor     Hypertension  - Norvasc  -- decrease dose    Type II DM  - A1c was 6.5% in August 2025     DVT prophylaxis: enoxaparin  (LOVENOX ) injection 40 mg Start: 06/14/24 1000    Code Status: Full Code Family Communication: called [place] 9/6  Disposition Plan:  Level of care: Telemetry        Subjective: Continues with reproductive left-sided chest pain currently  Objective: Vitals:   06/17/24 0759 06/17/24 1347 06/17/24 1937 06/18/24 0418  BP:  129/77 (!) 120/95 119/83  Pulse:  70 70 75  Resp:  20 18 17   Temp:  (!) 97.4 F  (36.3 C) (!) 97.4 F (36.3 C) 97.9 F (36.6 C)  TempSrc:  Oral Oral Oral  SpO2: 98% 96% 98% 97%  Weight:      Height:        Intake/Output Summary (Last 24 hours) at 06/18/2024 1312 Last data filed at 06/18/2024 1000 Gross per 24 hour  Intake 270 ml  Output 1900 ml  Net -1630 ml   Filed Weights   06/14/24 0000  Weight: 70.8 kg    Examination:   General: Appearance:    Well developed, well nourished male with mild dyspnea     Lungs:   On nasal cannula, appears comfortable able to speak in complete sentences  Heart:    Normal heart rate.    MS:   All extremities are intact.    Neurologic:   Awake, alert-able to make needs known-mild confusion       Data Reviewed: I have personally reviewed following labs and imaging studies  CBC: Recent Labs  Lab 06/13/24 1624 06/14/24 0502 06/15/24 0420 06/16/24 0540 06/17/24 0509  WBC 7.5 4.3 9.2 7.5 6.8  HGB 12.4* 12.1* 12.4* 12.0* 12.7*  HCT 37.9* 36.9* 38.6* 38.6* 39.3  MCV 91.3 92.7 94.4 93.7 93.8  PLT 279 242 256 263 259   Basic Metabolic Panel: Recent Labs  Lab 06/14/24 0502 06/15/24 0420 06/16/24 0540 06/17/24 0509 06/18/24 0425  NA 142 140 141 142 142  K 4.3 4.6 4.1 4.0  4.0  CL 105 103 102 102 101  CO2 27 26 27 28 30   GLUCOSE 176* 187* 196* 196* 228*  BUN 14 21 26* 27* 25*  CREATININE 0.76 0.96 1.00 0.88 0.72  CALCIUM  8.4* 8.5* 8.2* 8.0* 7.7*   GFR: Estimated Creatinine Clearance: 67.7 mL/min (by C-G formula based on SCr of 0.72 mg/dL). Liver Function Tests: No results for input(s): AST, ALT, ALKPHOS, BILITOT, PROT, ALBUMIN in the last 168 hours. No results for input(s): LIPASE, AMYLASE in the last 168 hours. No results for input(s): AMMONIA in the last 168 hours. Coagulation Profile: No results for input(s): INR, PROTIME in the last 168 hours. Cardiac Enzymes: No results for input(s): CKTOTAL, CKMB, CKMBINDEX, TROPONINI in the last 168 hours. BNP (last 3  results) Recent Labs    06/14/24 0502 06/15/24 1040  PROBNP 4,257.0* 6,826.0*   HbA1C: No results for input(s): HGBA1C in the last 72 hours. CBG: Recent Labs  Lab 06/17/24 1140 06/17/24 1618 06/17/24 2034 06/18/24 0724 06/18/24 1142  GLUCAP 255* 193* 271* 195* 315*   Lipid Profile: No results for input(s): CHOL, HDL, LDLCALC, TRIG, CHOLHDL, LDLDIRECT in the last 72 hours. Thyroid  Function Tests: No results for input(s): TSH, T4TOTAL, FREET4, T3FREE, THYROIDAB in the last 72 hours. Anemia Panel: No results for input(s): VITAMINB12, FOLATE, FERRITIN, TIBC, IRON, RETICCTPCT in the last 72 hours. Sepsis Labs: Recent Labs  Lab 06/17/24 0509  PROCALCITON <0.10    Recent Results (from the past 240 hours)  Resp panel by RT-PCR (RSV, Flu A&B, Covid) Anterior Nasal Swab     Status: None   Collection Time: 06/13/24  5:15 PM   Specimen: Anterior Nasal Swab  Result Value Ref Range Status   SARS Coronavirus 2 by RT PCR NEGATIVE NEGATIVE Final    Comment: (NOTE) SARS-CoV-2 target nucleic acids are NOT DETECTED.  The SARS-CoV-2 RNA is generally detectable in upper respiratory specimens during the acute phase of infection. The lowest concentration of SARS-CoV-2 viral copies this assay can detect is 138 copies/mL. A negative result does not preclude SARS-Cov-2 infection and should not be used as the sole basis for treatment or other patient management decisions. A negative result may occur with  improper specimen collection/handling, submission of specimen other than nasopharyngeal swab, presence of viral mutation(s) within the areas targeted by this assay, and inadequate number of viral copies(<138 copies/mL). A negative result must be combined with clinical observations, patient history, and epidemiological information. The expected result is Negative.  Fact Sheet for Patients:  BloggerCourse.com  Fact Sheet for  Healthcare Providers:  SeriousBroker.it  This test is no t yet approved or cleared by the [place]  FDA and  has been authorized for detection and/or diagnosis of SARS-CoV-2 by FDA under an Emergency Use Authorization (EUA). This EUA will remain  in effect (meaning this test can be used) for the duration of the COVID-19 declaration under Section 564(b)(1) of the Act, 21 U.S.C.section 360bbb-3(b)(1), unless the authorization is terminated  or revoked sooner.       Influenza A by PCR NEGATIVE NEGATIVE Final   Influenza B by PCR NEGATIVE NEGATIVE Final    Comment: (NOTE) The Xpert Xpress SARS-CoV-2/FLU/RSV plus assay is intended as an aid in the diagnosis of influenza from Nasopharyngeal swab specimens and should not be used as a sole basis for treatment. Nasal washings and aspirates are unacceptable for Xpert Xpress SARS-CoV-2/FLU/RSV testing.  Fact Sheet for Patients: BloggerCourse.com  Fact Sheet for Healthcare Providers: SeriousBroker.it  This test is not yet  approved or cleared by the [place]  FDA and has been authorized for detection and/or diagnosis of SARS-CoV-2 by FDA under an Emergency Use Authorization (EUA). This EUA will remain in effect (meaning this test can be used) for the duration of the COVID-19 declaration under Section 564(b)(1) of the Act, 21 U.S.C. section 360bbb-3(b)(1), unless the authorization is terminated or revoked.     Resp Syncytial Virus by PCR NEGATIVE NEGATIVE Final    Comment: (NOTE) Fact Sheet for Patients: BloggerCourse.com  Fact Sheet for Healthcare Providers: SeriousBroker.it  This test is not yet approved or cleared by the [place]  FDA and has been authorized for detection and/or diagnosis of SARS-CoV-2 by FDA under an Emergency Use Authorization (EUA). This EUA will remain in effect (meaning this  test can be used) for the duration of the COVID-19 declaration under Section 564(b)(1) of the Act, 21 U.S.C. section 360bbb-3(b)(1), unless the authorization is terminated or revoked.  Performed at [facility], [address] [place], [place] [address]   Respiratory (~20 pathogens) panel by PCR     Status: None   Collection Time: 06/14/24  8:57 AM   Specimen: Nasopharyngeal Swab; Respiratory  Result Value Ref Range Status   Adenovirus NOT DETECTED NOT DETECTED Final   Coronavirus 229E NOT DETECTED NOT DETECTED Final    Comment: (NOTE) The Coronavirus on the Respiratory Panel, DOES NOT test for the novel  Coronavirus (2019 nCoV)    Coronavirus HKU1 NOT DETECTED NOT DETECTED Final   Coronavirus NL63 NOT DETECTED NOT DETECTED Final   Coronavirus OC43 NOT DETECTED NOT DETECTED Final   Metapneumovirus NOT DETECTED NOT DETECTED Final   Rhinovirus / Enterovirus NOT DETECTED NOT DETECTED Final   Influenza A NOT DETECTED NOT DETECTED Final   Influenza B NOT DETECTED NOT DETECTED Final   Parainfluenza Virus 1 NOT DETECTED NOT DETECTED Final   Parainfluenza Virus 2 NOT DETECTED NOT DETECTED Final   Parainfluenza Virus 3 NOT DETECTED NOT DETECTED Final   Parainfluenza Virus 4 NOT DETECTED NOT DETECTED Final   Respiratory Syncytial Virus NOT DETECTED NOT DETECTED Final   Bordetella pertussis NOT DETECTED NOT DETECTED Final   Bordetella Parapertussis NOT DETECTED NOT DETECTED Final   Chlamydophila pneumoniae NOT DETECTED NOT DETECTED Final   Mycoplasma pneumoniae NOT DETECTED NOT DETECTED Final    Comment: Performed at [facility], [address], [place], [place] [address]         Radiology Studies: ECHOCARDIOGRAM COMPLETE Result Date: 06/17/2024    ECHOCARDIOGRAM REPORT   Patient Name:   Bradley HILBERT Sr. Date of Exam: 06/17/2024 Medical Rec #:  [id number]         Height:       68.0 in Accession #:    [id number]        Weight:       156.0 lb Date of Birth:  1941/07/27           BSA:          1.839 m Patient Age:    83 years          BP:           128/89 mmHg Patient Gender: M                 HR:           72 bpm. Exam Location:  Inpatient Procedure: 2D Echo, Cardiac Doppler and Color Doppler (Both Spectral and Color  Flow Doppler were utilized during procedure). Indications:    CHF I50.31  History:        Patient has prior history of Echocardiogram examinations, most                 recent 07/10/2023. CHF, CAD, Pacemaker, COPD, Arrythmias:Atrial                 Fibrillation; Risk Factors:Diabetes, Dyslipidemia and                 Hypertension.  Sonographer:    AS Referring Phys: [id number] Ilyse Tremain U Delray Reza IMPRESSIONS  1. Left ventricular ejection fraction, by estimation, is 50 to 55%. The left ventricle has low normal function. Left ventricular endocardial border not optimally defined to evaluate regional wall motion. Left ventricular diastolic parameters are indeterminate.  2. Right ventricular systolic function is mildly reduced. The right ventricular size is severely enlarged. There is severely elevated pulmonary artery systolic pressure. The estimated right ventricular systolic pressure is 63.2 mmHg.  3. Left atrial size was severely dilated.  4. Right atrial size was severely dilated.  5. The mitral valve is normal in structure. Trivial mitral valve regurgitation. No evidence of mitral stenosis.  6. The tricuspid valve is abnormal. Tricuspid valve regurgitation is mild to moderate.  7. The aortic valve is tricuspid. Aortic valve regurgitation is not visualized. Aortic valve sclerosis/calcification is present, without any evidence of aortic stenosis. Aortic valve mean gradient measures 5.0 mmHg.  8. The inferior vena cava is dilated in size with <50% respiratory variability, suggesting right atrial pressure of 15 mmHg. FINDINGS  Left Ventricle: Left ventricular ejection fraction, by estimation, is 50 to 55%. The left ventricle has low normal function. Left ventricular  endocardial border not optimally defined to evaluate regional wall motion. Strain was performed and the global longitudinal strain is indeterminate. The left ventricular internal cavity size was normal in size. There is no left ventricular hypertrophy. Left ventricular diastolic parameters are indeterminate. Right Ventricle: The right ventricular size is severely enlarged. No increase in right ventricular wall thickness. Right ventricular systolic function is mildly reduced. There is severely elevated pulmonary artery systolic pressure. The tricuspid regurgitant velocity is 3.47 m/s, and with an assumed right atrial pressure of 15 mmHg, the estimated right ventricular systolic pressure is 63.2 mmHg. Left Atrium: Left atrial size was severely dilated. Right Atrium: Right atrial size was severely dilated. Pericardium: There is no evidence of pericardial effusion. Mitral Valve: The mitral valve is normal in structure. Trivial mitral valve regurgitation. No evidence of mitral valve stenosis. MV peak gradient, 7.4 mmHg. The mean mitral valve gradient is 2.0 mmHg. Tricuspid Valve: The tricuspid valve is abnormal. Tricuspid valve regurgitation is mild to moderate. No evidence of tricuspid stenosis. Aortic Valve: The aortic valve is tricuspid. Aortic valve regurgitation is not visualized. Aortic valve sclerosis/calcification is present, without any evidence of aortic stenosis. Aortic valve mean gradient measures 5.0 mmHg. Aortic valve peak gradient measures 10.5 mmHg. Aortic valve area, by VTI measures 2.66 cm. Pulmonic Valve: The pulmonic valve was not well visualized. Pulmonic valve regurgitation is trivial. No evidence of pulmonic stenosis. Aorta: The aortic root and ascending aorta are structurally normal, with no evidence of dilitation. Venous: The inferior vena cava is dilated in size with less than 50% respiratory variability, suggesting right atrial pressure of 15 mmHg. IAS/Shunts: No atrial level shunt detected by  color flow Doppler. Additional Comments: 3D was performed not requiring image post processing on an independent workstation and was  indeterminate. A device lead is visualized.  LEFT VENTRICLE PLAX 2D LVIDd:         4.80 cm LVIDs:         3.80 cm LV PW:         0.80 cm LV IVS:        0.90 cm LVOT diam:     2.30 cm LV SV:         83 LV SV Index:   45 LVOT Area:     4.15 cm  LV Volumes (MOD) LV vol d, MOD A2C: 125.0 ml LV vol d, MOD A4C: 117.0 ml LV vol s, MOD A2C: 57.9 ml LV vol s, MOD A4C: 52.2 ml LV SV MOD A2C:     67.1 ml LV SV MOD A4C:     117.0 ml LV SV MOD BP:      66.9 ml RIGHT VENTRICLE            IVC RV Basal diam:  5.10 cm    IVC diam: 2.90 cm RV S prime:     9.03 cm/s TAPSE (M-mode): 1.7 cm LEFT ATRIUM             Index        RIGHT ATRIUM           Index LA diam:        4.80 cm 2.61 cm/m   RA Area:     30.50 cm LA Vol (A2C):   54.0 ml 29.36 ml/m  RA Volume:   95.40 ml  51.88 ml/m LA Vol (A4C):   86.8 ml 47.20 ml/m LA Biplane Vol: 68.4 ml 37.20 ml/m  AORTIC VALVE                     PULMONIC VALVE AV Area (Vmax):    2.85 cm      PV Vmax:          0.86 m/s AV Area (Vmean):   2.69 cm      PV Peak grad:     2.9 mmHg AV Area (VTI):     2.66 cm      PR End Diast Vel: 5.24 msec AV Vmax:           162.00 cm/s AV Vmean:          103.000 cm/s AV VTI:            0.311 m AV Peak Grad:      10.5 mmHg AV Mean Grad:      5.0 mmHg LVOT Vmax:         111.00 cm/s LVOT Vmean:        66.700 cm/s LVOT VTI:          0.199 m LVOT/AV VTI ratio: 0.64  AORTA Ao Root diam: 3.40 cm Ao Asc diam:  3.30 cm MITRAL VALVE                TRICUSPID VALVE MV Area (PHT): 5.70 cm     TR Peak grad:   48.2 mmHg MV Area VTI:   2.79 cm     TR Vmax:        347.00 cm/s MV Peak grad:  7.4 mmHg MV Mean grad:  2.0 mmHg     SHUNTS MV Vmax:       1.36 m/s     Systemic VTI:  0.20 m MV Vmean:      62.0 cm/s    Systemic Diam: 2.30  cm MV Decel Time: 133 msec MR Peak grad: 72.9 mmHg MR Vmax:      427.00 cm/s MV E velocity: 121.00 cm/s MV A  velocity: 39.30 cm/s MV E/A ratio:  3.08 Vishnu Priya Mallipeddi Electronically signed by Diannah Late Mallipeddi Signature Date/Time: 06/17/2024/2:23:07 PM    Final          Scheduled Meds:  aspirin   81 mg Oral q morning   atorvastatin   40 mg Oral QHS   bisoprolol   10 mg Oral Daily   diclofenac  Sodium  2 g Topical QID   enoxaparin  (LOVENOX ) injection  40 mg Subcutaneous Q24H   furosemide   40 mg Intravenous BID   insulin  aspart  0-5 Units Subcutaneous QHS   insulin  aspart  0-6 Units Subcutaneous TID WC   methylPREDNISolone  (SOLU-MEDROL ) injection  125 mg Intravenous Q12H   pantoprazole   40 mg Oral QAC breakfast   sodium chloride  flush  3 mL Intravenous Q12H   tamsulosin   0.4 mg Oral QPC supper   Continuous Infusions:     LOS: 4 days    Time spent: 45 minutes spent on chart review, discussion with nursing staff, consultants, updating family and interview/physical exam; more than 50% of that time was spent in counseling and/or coordination of care.    Harlene RAYMOND Bowl, DO [facility] Available via Epic secure chat 7am-7pm After these hours, please refer to coverage provider listed on [email or web address] 06/18/2024, 1:12 PM

## 2024-06-19 DIAGNOSIS — Z515 Encounter for palliative care: Secondary | ICD-10-CM | POA: Diagnosis not present

## 2024-06-19 DIAGNOSIS — J441 Chronic obstructive pulmonary disease with (acute) exacerbation: Secondary | ICD-10-CM | POA: Diagnosis not present

## 2024-06-19 DIAGNOSIS — Z66 Do not resuscitate: Secondary | ICD-10-CM | POA: Diagnosis not present

## 2024-06-19 DIAGNOSIS — R0602 Shortness of breath: Secondary | ICD-10-CM

## 2024-06-19 DIAGNOSIS — I272 Pulmonary hypertension, unspecified: Secondary | ICD-10-CM | POA: Diagnosis not present

## 2024-06-19 DIAGNOSIS — I5033 Acute on chronic diastolic (congestive) heart failure: Secondary | ICD-10-CM | POA: Diagnosis not present

## 2024-06-19 DIAGNOSIS — Z7189 Other specified counseling: Secondary | ICD-10-CM | POA: Diagnosis not present

## 2024-06-19 DIAGNOSIS — I4819 Other persistent atrial fibrillation: Secondary | ICD-10-CM | POA: Diagnosis not present

## 2024-06-19 DIAGNOSIS — Z79899 Other long term (current) drug therapy: Secondary | ICD-10-CM

## 2024-06-19 DIAGNOSIS — I10 Essential (primary) hypertension: Secondary | ICD-10-CM | POA: Diagnosis not present

## 2024-06-19 LAB — BASIC METABOLIC PANEL WITH GFR
Anion gap: 7 (ref 5–15)
BUN: 21 mg/dL (ref 8–23)
CO2: 34 mmol/L — ABNORMAL HIGH (ref 22–32)
Calcium: 7.4 mg/dL — ABNORMAL LOW (ref 8.9–10.3)
Chloride: 98 mmol/L (ref 98–111)
Creatinine, Ser: 0.68 mg/dL (ref 0.61–1.24)
GFR, Estimated: >60 mL/min (ref >60)
Glucose, Bld: 238 mg/dL — ABNORMAL HIGH (ref 70–99)
Potassium: 3.8 mmol/L (ref 3.5–5.1)
Sodium: 139 mmol/L (ref 135–145)

## 2024-06-19 LAB — GLUCOSE, CAPILLARY
Glucose-Capillary: 186 mg/dL — ABNORMAL HIGH (ref 70–99)
Glucose-Capillary: 173 mg/dL — ABNORMAL HIGH (ref 70–99)
Glucose-Capillary: 187 mg/dL — ABNORMAL HIGH (ref 70–99)
Glucose-Capillary: 167 mg/dL — ABNORMAL HIGH (ref 70–99)
Glucose-Capillary: 87 mg/dL (ref 70–99)

## 2024-06-19 MED ORDER — INSULIN ASPART 100 UNIT/ML IJ SOLN
0.0000 [IU] | Freq: Three times a day (TID) | INTRAMUSCULAR | Status: DC
  Administered 2024-06-19 – 2024-06-20 (×3): 3 [IU] via SUBCUTANEOUS
  Administered 2024-06-20: 2 [IU] via SUBCUTANEOUS
  Administered 2024-06-21: 3 [IU] via SUBCUTANEOUS
  Administered 2024-06-21: 5 [IU] via SUBCUTANEOUS

## 2024-06-19 MED ORDER — INSULIN ASPART 100 UNIT/ML IJ SOLN
0.0000 [IU] | Freq: Every day | INTRAMUSCULAR | Status: DC
  Administered 2024-06-20: 3 [IU] via SUBCUTANEOUS

## 2024-06-19 MED ORDER — MORPHINE SULFATE (CONCENTRATE) 10 MG /0.5 ML PO SOLN
5.0000 mg | ORAL | Status: DC | PRN
  Administered 2024-06-19 (×2): 5 mg via ORAL
  Filled 2024-06-19 (×2): qty 0.5

## 2024-06-19 MED ORDER — PREDNISONE 20 MG PO TABS
40.0000 mg | ORAL_TABLET | Freq: Every day | ORAL | Status: DC
  Administered 2024-06-20 – 2024-06-21 (×2): 40 mg via ORAL
  Filled 2024-06-19 (×2): qty 2

## 2024-06-19 NOTE — Progress Notes (Addendum)
 PROGRESS NOTE    Bradley [name]  FMW:[id number] DOB: 1941-02-28 DOA: 06/13/2024 PCP: Amon Aloysius BRAVO, MD    Brief Narrative:  Bradley JINNY Dalia Sr. is a 83 y.o. male with medical history significant for hypertension, type 2 diabetes mellitus, PAD, CAD, atrial fibrillation status post AV node ablation, not anticoagulated due to history of ICH, tachybradycardia syndrome with pacemaker, chronic HFpEF, COPD-on 5L a thome, and chronic hypoxic and hypercarbic respiratory failure who presents with cough and shortness of breath.   Patient reports worsening shortness of breath over the past few days, particularly with activity.  He has increased cough associated with this.  He denies any chest pain, fever, chills, or lower extremity swelling.  Appears he had a combination of both COPD exacerbation as well as heart failure.  Very slow to improve with marked symptoms at rest and with ambulation  Palliative care consultation for goals of care  Assessment and Plan: COPD exacerbation; chronic hypoxic & hypercarbic respiratory failure  - Transition to oral steroids - Has been weaned back to home 5 L - Scheduled nebs and as needed - Trial of liquid morphine    Chronic HFpEF with known pulm HTN - ? Volume overloaded-- IV lasix  with good results -monitor output -echo similar to prior - cards consult appreciated   Chest pain -appears somewhat reproductive and probably associated with COPD exacerbation   Atrial fibrillation  - S/p AV nodal ablation; has not been anticoagulated d/t hx of ICH  - Continue bisoprolol      CAD  - No recent angina  - Continue ASA and Lipitor     Hypertension  - Norvasc  -- decrease dose to allow for diuresis    Type II DM  - A1c was 6.5% in August 2025     DVT prophylaxis: enoxaparin  (LOVENOX ) injection 40 mg Start: 06/14/24 1000    Code Status: Full Code Family Communication: Spoke with Bradley [name]/Rapheal 9/7-no family at bedside on 9/9  Disposition Plan:  Level of  care: Telemetry        Subjective: Continues to have left-sided chest pain but improved from yesterday-does not understand his chronic medical illnesses as he states he does not want to have to go through this any longer  Objective: Vitals:   06/18/24 1932 06/19/24 0642 06/19/24 0800 06/19/24 1009  BP: 121/79 136/83 136/82 123/76  Pulse: 78 70 73 70  Resp: 18 19    Temp: 98.8 F (37.1 C) 97.7 F (36.5 C) 98 F (36.7 C) 97.9 F (36.6 C)  TempSrc: Oral Oral Oral Oral  SpO2: 94% 100% 100% 99%  Weight:      Height:        Intake/Output Summary (Last 24 hours) at 06/19/2024 1222 Last data filed at 06/19/2024 1000 Gross per 24 hour  Intake 1430 ml  Output 3575 ml  Net -2145 ml   Filed Weights   06/14/24 0000  Weight: 70.8 kg    Examination:   General: Appearance:    Well developed, well nourished male with mild dyspnea     Lungs:   On nasal cannula  Heart:    Normal heart rate.    MS:   All extremities are intact.    Neurologic:   Awake, alert-able to make needs known-mild confusion       Data Reviewed: I have personally reviewed following labs and imaging studies  CBC: Recent Labs  Lab 06/13/24 1624 06/14/24 0502 06/15/24 0420 06/16/24 0540 06/17/24 0509  WBC 7.5 4.3 9.2 7.5  6.8  HGB 12.4* 12.1* 12.4* 12.0* 12.7*  HCT 37.9* 36.9* 38.6* 38.6* 39.3  MCV 91.3 92.7 94.4 93.7 93.8  PLT 279 242 256 263 259   Basic Metabolic Panel: Recent Labs  Lab 06/15/24 0420 06/16/24 0540 06/17/24 0509 06/18/24 0425 06/19/24 0825  NA 140 141 142 142 139  K 4.6 4.1 4.0 4.0 3.8  CL 103 102 102 101 98  CO2 26 27 28 30  34*  GLUCOSE 187* 196* 196* 228* 238*  BUN 21 26* 27* 25* 21  CREATININE 0.96 1.00 0.88 0.72 0.68  CALCIUM  8.5* 8.2* 8.0* 7.7* 7.4*   GFR: Estimated Creatinine Clearance: 67.7 mL/min (by C-G formula based on SCr of 0.68 mg/dL). Liver Function Tests: No results for input(s): AST, ALT, ALKPHOS, BILITOT, PROT, ALBUMIN in the last 168  hours. No results for input(s): LIPASE, AMYLASE in the last 168 hours. No results for input(s): AMMONIA in the last 168 hours. Coagulation Profile: No results for input(s): INR, PROTIME in the last 168 hours. Cardiac Enzymes: No results for input(s): CKTOTAL, CKMB, CKMBINDEX, TROPONINI in the last 168 hours. BNP (last 3 results) Recent Labs    06/14/24 0502 06/15/24 1040  PROBNP 4,257.0* 6,826.0*   HbA1C: No results for input(s): HGBA1C in the last 72 hours. CBG: Recent Labs  Lab 06/18/24 1710 06/18/24 2101 06/19/24 0621 06/19/24 0717 06/19/24 1137  GLUCAP 198* 230* 186* 173* 187*   Lipid Profile: No results for input(s): CHOL, HDL, LDLCALC, TRIG, CHOLHDL, LDLDIRECT in the last 72 hours. Thyroid  Function Tests: No results for input(s): TSH, T4TOTAL, FREET4, T3FREE, THYROIDAB in the last 72 hours. Anemia Panel: No results for input(s): VITAMINB12, FOLATE, FERRITIN, TIBC, IRON, RETICCTPCT in the last 72 hours. Sepsis Labs: Recent Labs  Lab 06/17/24 0509  PROCALCITON <0.10    Recent Results (from the past 240 hours)  Resp panel by RT-PCR (RSV, Flu A&B, Covid) Anterior Nasal Swab     Status: None   Collection Time: 06/13/24  5:15 PM   Specimen: Anterior Nasal Swab  Result Value Ref Range Status   SARS Coronavirus 2 by RT PCR NEGATIVE NEGATIVE Final    Comment: (NOTE) SARS-CoV-2 target nucleic acids are NOT DETECTED.  The SARS-CoV-2 RNA is generally detectable in upper respiratory specimens during the acute phase of infection. The lowest concentration of SARS-CoV-2 viral copies this assay can detect is 138 copies/mL. A negative result does not preclude SARS-Cov-2 infection and should not be used as the sole basis for treatment or other patient management decisions. A negative result may occur with  improper specimen collection/handling, submission of specimen other than nasopharyngeal swab, presence of viral  mutation(s) within the areas targeted by this assay, and inadequate number of viral copies(<138 copies/mL). A negative result must be combined with clinical observations, patient history, and epidemiological information. The expected result is Negative.  Fact Sheet for Patients:  BloggerCourse.com  Fact Sheet for Healthcare Providers:  SeriousBroker.it  This test is no t yet approved or cleared by the [place]  FDA and  has been authorized for detection and/or diagnosis of SARS-CoV-2 by FDA under an Emergency Use Authorization (EUA). This EUA will remain  in effect (meaning this test can be used) for the duration of the COVID-19 declaration under Section 564(b)(1) of the Act, 21 U.S.C.section 360bbb-3(b)(1), unless the authorization is terminated  or revoked sooner.       Influenza A by PCR NEGATIVE NEGATIVE Final   Influenza B by PCR NEGATIVE NEGATIVE Final    Comment: (NOTE)  The Xpert Xpress SARS-CoV-2/FLU/RSV plus assay is intended as an aid in the diagnosis of influenza from Nasopharyngeal swab specimens and should not be used as a sole basis for treatment. Nasal washings and aspirates are unacceptable for Xpert Xpress SARS-CoV-2/FLU/RSV testing.  Fact Sheet for Patients: BloggerCourse.com  Fact Sheet for Healthcare Providers: SeriousBroker.it  This test is not yet approved or cleared by the [place]  FDA and has been authorized for detection and/or diagnosis of SARS-CoV-2 by FDA under an Emergency Use Authorization (EUA). This EUA will remain in effect (meaning this test can be used) for the duration of the COVID-19 declaration under Section 564(b)(1) of the Act, 21 U.S.C. section 360bbb-3(b)(1), unless the authorization is terminated or revoked.     Resp Syncytial Virus by PCR NEGATIVE NEGATIVE Final    Comment: (NOTE) Fact Sheet for  Patients: BloggerCourse.com  Fact Sheet for Healthcare Providers: SeriousBroker.it  This test is not yet approved or cleared by the [place]  FDA and has been authorized for detection and/or diagnosis of SARS-CoV-2 by FDA under an Emergency Use Authorization (EUA). This EUA will remain in effect (meaning this test can be used) for the duration of the COVID-19 declaration under Section 564(b)(1) of the Act, 21 U.S.C. section 360bbb-3(b)(1), unless the authorization is terminated or revoked.  Performed at [facility], [address] [place], [place] [address]   Respiratory (~20 pathogens) panel by PCR     Status: None   Collection Time: 06/14/24  8:57 AM   Specimen: Nasopharyngeal Swab; Respiratory  Result Value Ref Range Status   Adenovirus NOT DETECTED NOT DETECTED Final   Coronavirus 229E NOT DETECTED NOT DETECTED Final    Comment: (NOTE) The Coronavirus on the Respiratory Panel, DOES NOT test for the novel  Coronavirus (2019 nCoV)    Coronavirus HKU1 NOT DETECTED NOT DETECTED Final   Coronavirus NL63 NOT DETECTED NOT DETECTED Final   Coronavirus OC43 NOT DETECTED NOT DETECTED Final   Metapneumovirus NOT DETECTED NOT DETECTED Final   Rhinovirus / Enterovirus NOT DETECTED NOT DETECTED Final   Influenza A NOT DETECTED NOT DETECTED Final   Influenza B NOT DETECTED NOT DETECTED Final   Parainfluenza Virus 1 NOT DETECTED NOT DETECTED Final   Parainfluenza Virus 2 NOT DETECTED NOT DETECTED Final   Parainfluenza Virus 3 NOT DETECTED NOT DETECTED Final   Parainfluenza Virus 4 NOT DETECTED NOT DETECTED Final   Respiratory Syncytial Virus NOT DETECTED NOT DETECTED Final   Bordetella pertussis NOT DETECTED NOT DETECTED Final   Bordetella Parapertussis NOT DETECTED NOT DETECTED Final   Chlamydophila pneumoniae NOT DETECTED NOT DETECTED Final   Mycoplasma pneumoniae NOT DETECTED NOT DETECTED Final    Comment: Performed  at [facility], [address], [place], [place] [address]         Radiology Studies: No results found.        Scheduled Meds:  aspirin   81 mg Oral q morning   atorvastatin   40 mg Oral QHS   bisoprolol   10 mg Oral Daily   diclofenac  Sodium  2 g Topical QID   enoxaparin  (LOVENOX ) injection  40 mg Subcutaneous Q24H   furosemide   40 mg Intravenous BID   insulin  aspart  0-15 Units Subcutaneous TID WC   insulin  aspart  0-5 Units Subcutaneous QHS   pantoprazole   40 mg Oral QAC breakfast   [START ON 06/20/2024] predniSONE   40 mg Oral Q breakfast   sodium chloride  flush  3 mL Intravenous Q12H  tamsulosin   0.4 mg Oral QPC supper   Continuous Infusions:     LOS: 5 days    Time spent: 45 minutes spent on chart review, discussion with nursing staff, consultants, updating family and interview/physical exam; more than 50% of that time was spent in counseling and/or coordination of care.    Harlene RAYMOND Bowl, DO [facility] Available via Epic secure chat 7am-7pm After these hours, please refer to coverage provider listed on [email or web address] 06/19/2024, 12:22 PM

## 2024-06-19 NOTE — Progress Notes (Signed)
 Mobility Specialist - Progress Note  ( 4L) Pre-mobility: 70 bpm HR, 98% SpO2 During mobility: 90 bpm HR, 91% SpO2 Post-mobility: 89 bpm HR, 93% SPO2   06/19/24 0854  Mobility  Activity Ambulated with assistance  Level of Assistance Modified independent, requires aide device or extra time  Assistive Device Front wheel walker  Distance Ambulated (ft) 300 ft  Range of Motion/Exercises Active  Activity Response Tolerated well  Mobility Referral Yes  Mobility visit 1 Mobility  Mobility Specialist Start Time (ACUTE ONLY) 0840  Mobility Specialist Stop Time (ACUTE ONLY) 0854  Mobility Specialist Time Calculation (min) (ACUTE ONLY) 14 min   Pt was found in bed and agreeable to ambulate. Stated having labored breathing during ambulation. At EOS returned to bed with all needs met. Call bell in reach.  Erminio Leos,  Mobility Specialist Can be reached via Secure Chat

## 2024-06-19 NOTE — TOC Progression Note (Signed)
 Transition of Care (TOC) - Progression Note    Patient Details  Name: Bradley WAMBLE Sr. MRN: [id number] Date of Birth: [birth year]  Transition of Care [facility]) CM/SW Contact  Verlene Glantz, Nathanel, RN Phone Number: 06/19/2024, 3:53 PM  Clinical Narrative:Spoke to Ralph(grand son in law) about resources. Explained I will asst as needed after ongoing conversation w/palliative team on referral placed. Await outcome of meeting tomorrow @ 3:30p.        Expected Discharge Plan: Home w Hospice Care Barriers to Discharge: Continued Medical Work up               Expected Discharge Plan and Services   Discharge Planning Services: CM Consult   Living arrangements for the past 2 months: Single Family Home                                       Social Drivers of Health (SDOH) Interventions SDOH Screenings   Food Insecurity: No Food Insecurity (06/13/2024)  Housing: Low Risk  (06/13/2024)  Transportation Needs: No Transportation Needs (06/13/2024)  Utilities: Not At Risk (06/13/2024)  Alcohol Screen: Low Risk  (04/11/2024)  Depression (PHQ2-9): Low Risk  (05/23/2024)  Financial Resource Strain: Low Risk  (04/11/2024)  Physical Activity: Insufficiently Active (04/11/2024)  Social Connections: Moderately Isolated (06/13/2024)  Stress: No Stress Concern Present (04/11/2024)  Tobacco Use: Medium Risk (06/13/2024)  Health Literacy: Adequate Health Literacy (04/11/2024)    Readmission Risk Interventions    07/07/2023    2:33 PM 08/29/2022    3:33 PM  Readmission Risk Prevention Plan  Transportation Screening Complete Complete  PCP or Specialist Appt within 3-5 Days Complete Complete  HRI or Home Care Consult Complete Complete  Social Work Consult for Recovery Care Planning/Counseling Complete Complete  Palliative Care Screening Not Applicable Not Applicable  Medication Review Oceanographer) Referral to Pharmacy Complete

## 2024-06-19 NOTE — Plan of Care (Signed)

## 2024-06-19 NOTE — Progress Notes (Signed)
  Progress Note  Patient Name: Bradley KANTNER Sr. Date of Encounter: 06/19/2024 [facility] Cardiologist: None   Interval Summary   Responding well to IV Lasix , would continue Primary team discussed palliative care with patient, is he not interested Going to trial low dose morphine  for air hunger Remains on his baseline 5 L oxygen   Vital Signs Vitals:   06/18/24 1523 06/18/24 1932 06/19/24 0642 06/19/24 0800  BP: 135/85 121/79 136/83 136/82  Pulse: 73 78 70 73  Resp: (!) 22 18 19    Temp: 98 F (36.7 C) 98.8 F (37.1 C) 97.7 F (36.5 C) 98 F (36.7 C)  TempSrc: Oral Oral Oral Oral  SpO2: 99% 94% 100% 100%  Weight:      Height:        Intake/Output Summary (Last 24 hours) at 06/19/2024 1002 Last data filed at 06/19/2024 0839 Gross per 24 hour  Intake 1430 ml  Output 3275 ml  Net -1845 ml      06/14/2024   12:00 AM 05/23/2024    9:10 AM 04/11/2024    1:51 PM  Last 3 Weights  Weight (lbs) 156 lb 172 lb 4 oz 185 lb  Weight (kg) 70.761 kg 78.132 kg 83.915 kg     Telemetry/ECG  Atrial fibrillation, V paced, heart rate 70s- Personally Reviewed  Physical Exam  GEN: No acute distress, remains on 5 L oxygen (his baseline)   Neck: No JVD Cardiac: iRRR, no murmurs, rubs, or gallops.  Respiratory: decreased lung sounds, able to speak in full sentences, mild dyspnea  GI: Soft, nontender, non-distended  MS: No edema  Assessment & Plan   Acute on chronic diastolic heart failure Mild RV dysfunction Pulmonary HTN COPD, on O2 at home  Echo: LVEF 50-55%, PASP 63 mmHg, mild RV dysfunction, severe RV enlargement, severe BAE, mild to moderate TR, RAP 15 mmHg LHC 09/2022: EF 50-55%, normal LVEDP, mild pulmonary HTN, minimal nonobstructive CAD, suspect group 2/3 PFTs 2021: severe restrictive lung disease, moderately severe diffusion defect Echo remains fairly unchanged from year prior  CTPA negative for PE ProBNP 6,826 Net - 6.9 L with 3.1 L out yesterday  Renal function  has been stable  Currently on IV Lasix  40 mg BID  Recommend outpatient split-night sleep study per MD Continue daily weights, strict I&O's, daily BMPs Would benefit from outpatient AHF referral at discharge   Persistent atrial fibrillation Sick sinus syndrome s/p PPM No long-term AC due to ICH history  Currently in A. Fib with HR 70s Continue bisoprolol  10 mg daily   Chest pain Troponin < 15 Patient reports chronic chest pain Suspect related to his COPD Echo: LVEF 50-55% LHC 09/2022: minimal nonobstructive CAD No further ischemic workup at this time  Consider further eval if chest pain persists with control of COPD    For questions or updates, please contact [facility] Please consult [email or web address] for contact info under       Signed, Waddell DELENA Donath, PA-C

## 2024-06-19 NOTE — Consult Note (Signed)
 Consultation Note Date: 06/19/2024   Patient Name: Bradley [name].  DOB: 1941-01-20  MRN: [id number]  Age / Sex: 83 y.o., male   PCP: Amon Aloysius BRAVO, MD Referring Physician: Juvenal Harlene PENNER, DO  Reason for Consultation: Establishing goals of care     Chief Complaint/History of Present Illness:   Patient is an 83 year old male with past medical history of hypertension, type 2 diabetes mellitus, PAD, CAD, atrial fibrillation status post AV nodal ablation not on anticoagulation due to history of ICH, tachybradycardia syndrome with pacemaker, HFpEF, and chronic hypoxic and hypercarbic respiratory failure in setting of COPD on 5 L nasal cannula at home was admitted on 06/13/2024 for management of shortness of breath and cough.  Since admission patient has received management for COPD exacerbation and concern for possible volume overload in setting of HFpEF.  Cardiology consulted for recommendations.  Palliative medicine team consulted to assist with complex medical decision making.  Extensive review of EMR including documentation from cardiology and hospitalist prior to presenting to bedside. Review of recent BMP noting creatinine over 60; monitor to assist with medication management.  Personally reviewed CT chest from 06/13/2024 noting changes of chronic lung disease without finding of PE.  Presented to bedside to see patient.  Patient laying in bed with noted increased work of breathing.  No visitors present at bedside.  With permission, introduced myself as a member of the palliative medicine team my role in patient's medical journey.  Spent time learning about patient's medical care up to this point.  Patient discussed multiple hospitalizations for his worsening breathing.  Spent time discussing and explaining patient's multiple medical conditions including HFpEF and COPD leading to his worsening respiratory status.  Discussed how these diseases tend to progress with time and do not get better.   Discussed how interventions in the [facility] are aimed at providing management for these conditions while not reversing the underlying etiology.  Spent time answering patient's questions regarding this.  With permission, able to discuss care planning moving forward.  Patient notes that he enjoyed his time at home with his granddaughter, her boyfriend, and their baby.  Patient also enjoys time with his grandson.  He wants to have more time at home with family.  Discussed pathways for medical care moving forward.  Discussed continued aggressive medical interventions such as returning to the [facility] for medical management.  Also discussed that should patient determine that return to the [facility] is not providing quality of life for him, could transition to focus on comfort at home with hospice support.  Provided generalities about philosophy of hospice and focusing on quality of life at end-of-life.  Discussed managing symptoms at home while allowing someone to be able to die at home with family.  Patient notes he had a good 83 years and does want more time at home with family.  Patient noted he wanted to discuss this with family before making any decisions.  Acknowledged this.  With permission also able to discuss advance care planning.  Patient notes that he has been well stating he would not want his grandson and granddaughter to make decisions on his behalf if he was not able to make decisions for himself.  Also able to discuss CODE STATUS.  Spent time explaining full code versus DNR/DNI.  Patient noted that he believes it documents state he is to be DNR though he also wants to discuss this with family.  At that time, patient called in placed Bradley [name] (patient's granddaughter's significant other)  on speaker phone.  Patient wanted this provider to update Bradley [name].  Updated him on the conversation and discussions about hospice.  Also updated on CODE STATUS.  During this discussion patient outright stated that he did  not want to be full code and wanted to be DNR/DNI which well supported.  Bradley [name] noted he would reach out to other family members to coordinate care and discussions about possible hospice at home.  From planning on being at bedside tomorrow at 3:30 PM.  Noted would follow-up then though they can always inform team if make a decision about hospice before then.  Also discussed symptom management at that time.  Patient noted that the morphine  solution did not assist with his shortness of breath and he did not feel anything.  Noted with increased morphine  dose and asked RN to provide to determine if needs to be further adjusted.  Answered all of patient's and Ralph's questions at that time.  Noted palliative medicine team to continue to follow with patient's medical journey.  Then informed by RN that patient's grandson, Bradley [name], had called requesting to speak to provider.  Able to call Bradley [name] back and introduced myself as a member of the palliative medicine team my role in patient's medical journey.  We again spent time discussing patient's medical illness.  Discussed again introduction of hospice at home support and symptom management.  Bradley [name] noted that they had hospice support through [facility] [place] in October of last year when their grandmother, patient's wife, died.  He noted that [facility] [place] was a really good group so they will consider if they want to involve them at home.  Also updated about change of CODE STATUS to DNR/DNI which Bradley [name] supports.  Spent time answering questions as able. Jarmon request to be involved in conversations even after at work to assist with care planning for patient.  Noted how patient had specifically called Bradley [name] though Guenther states that patient does not want to bother him but he wants to be involved in these conversations.  Acknowledged this.  Noted palliative medicine team to continue to follow with patient's medical journey.  Discussed care with hospitalist,  cardiologist, TOC, and RN to coordinate care.  Primary Diagnoses  Present on Admission:  COPD exacerbation (HCC)  Chronic respiratory failure with hypoxia and hypercapnia (HCC)  Chronic diastolic heart failure (HCC)  Atrial fibrillation (HCC)  CAD (coronary artery disease)  Primary hypertension  Pulmonary hypertension, unspecified (HCC)  Chest pain of uncertain etiology   Palliative Review of Systems: Shortness of breath  Past Medical History:  Diagnosis Date   Abnormal CT scan, chest 2012   CT chest, several lymphadenopathies. Sees pulmonary   Anemia    intermittent   Arthritis    ?back (09/19/2018)   Atrial fibrillation (HCC)    s/p AV node ablation & BiV PPM implantation 09/08/11 (op dictation pending)   BPH (benign prostatic hyperplasia)    Saw Dr Amiel 2004, normal renal u/s   Bronchitis 08/26/2017   CAD (coronary artery disease)    CHF (congestive heart failure) (HCC)    Thought primarily to be non-systolic although EF down (EF 54-49% 12/2010, down to 35-40% 09/05/11), cath 2008 with no CAD, nuclear study 07/2011 showing Small area of reversibility in the distal ant/lat wall the left ventricle suspicious for ischemia/septal wall HK but felt to be low risk  (per D/C Summary 07/2011)   Chronic bronchitis (HCC)    get it ~ q yr   Chronic diastolic  heart failure (HCC)         Chronic respiratory failure with hypoxia and hypercapnia (HCC) 09/14/2010   Followed in Pulmonary clinic/ [facility]/ [facility]  Started on 02 2lpm at discharge 09/23/11   - PFT's 10/28/2011  FEV1  1.40 (51%)  with ratio 70 and no better p B2 and DLCO 53 corrects to 101    - PFT's 10/18/2014    FEV1 1.71 (59%) with ratio 68 and no sign change p B2 and DLCO 53 corrects to 85  -  HC03   07/28/20  = 33  -  HCO3   03/17/21     = 31     Heart murmur    HLD (hyperlipidemia)    HTN (hypertension)    ICB (intracranial bleed) (HCC) 06/2012   d/c coumadin  permanently   Insomnia    Migraines    very  very rare   On home oxygen therapy    2L; 24/7 (09/19/2018)   Pacemaker    Peripheral vascular disease (HCC)    ??   Pleural effusion 2008   S/p decortication   Pneumonia 08/02/2011   Pulmonary HTN (HCC)    per cath 2008   Type II diabetes mellitus (HCC) 1999   Social History   Socioeconomic History   Marital status: Widowed    Spouse name: Bradley [name]   Number of children: 1   Years of education: Not on file   Highest education level: Not on file  Occupational History   Occupation: retired Publishing rights manager: RETIRED  Tobacco Use   Smoking status: Former    Current packs/day: 0.00    Average packs/day: 2.5 packs/day for 25.0 years (62.5 ttl pk-yrs)    Types: Cigarettes, Cigars    Start date: 10/12/1955    Quit date: 10/11/1980    Years since quitting: 43.7   Smokeless tobacco: Never  Vaping Use   Vaping status: Never Used  Substance and Sexual Activity   Alcohol use: Not Currently    Comment: 1 can of beer ever 4-5 months   Drug use: No   Sexual activity: Not Currently  Other Topics Concern   Not on file  Social History Narrative   Moved from [place]..   Wife, Bradley  passed away 08-23-2023   1 son, substance abuse, passed away 10/22/16   Household: pt, Warden/ranger, her child and her husband    Still drives as off 05/2024         Social Drivers of Health   Financial Resource Strain: Low Risk  (04/11/2024)   Overall Financial Resource Strain (CARDIA)    Difficulty of Paying Living Expenses: Not hard at all  Food Insecurity: No Food Insecurity (06/13/2024)   Hunger Vital Sign    Worried About Running Out of Food in the Last Year: Never true    Ran Out of Food in the Last Year: Never true  Transportation Needs: No Transportation Needs (06/13/2024)   PRAPARE - Administrator, Civil Service (Medical): No    Lack of Transportation (Non-Medical): No  Physical Activity: Insufficiently Active (04/11/2024)   Exercise Vital Sign    Days of Exercise per  Week: 4 days    Minutes of Exercise per Session: 20 min  Stress: No Stress Concern Present (04/11/2024)   [employer] of Occupational Health - Occupational Stress Questionnaire    Feeling of Stress: Not at all  Social Connections: Moderately Isolated (06/13/2024)   Social Connection and Isolation  Panel    Frequency of Communication with Friends and Family: More than three times a week    Frequency of Social Gatherings with Friends and Family: More than three times a week    Attends Religious Services: 1 to 4 times per year    Active Member of [employer] or Organizations: No    Attends Banker Meetings: Never    Marital Status: Widowed   Family History  Problem Relation Age of Onset   Diabetes Father    Coronary artery disease Father    Polycythemia Mother    Drug abuse Son    Breast cancer Maternal Aunt    Prostate cancer Neg Hx    Colon cancer Neg Hx    Scheduled Meds:  aspirin   81 mg Oral q morning   atorvastatin   40 mg Oral QHS   bisoprolol   10 mg Oral Daily   diclofenac  Sodium  2 g Topical QID   enoxaparin  (LOVENOX ) injection  40 mg Subcutaneous Q24H   furosemide   40 mg Intravenous BID   insulin  aspart  0-15 Units Subcutaneous TID WC   insulin  aspart  0-5 Units Subcutaneous QHS   pantoprazole   40 mg Oral QAC breakfast   [START ON 06/20/2024] predniSONE   40 mg Oral Q breakfast   sodium chloride  flush  3 mL Intravenous Q12H   tamsulosin   0.4 mg Oral QPC supper   Continuous Infusions: PRN Meds:.acetaminophen  **OR** acetaminophen , guaiFENesin , ipratropium-albuterol , morphine  CONCENTRATE, senna-docusate Allergies  Allergen Reactions   Hydrocodone  Other (See Comments)    given to him in the [facility]; went thru withdrawals once home; dr said not to take it again (09/27/2012) dizziness   Ultram  [Tramadol ] Other (See Comments)    given to him in the [facility]; went thru withdrawals once home; dr said not to take it again (09/27/2012) dizziness   Cialis  [Tadalafil] Other (See Comments)    Headache Backache  Dizziness    Avelox  [Moxifloxacin  Hydrochloride] Itching and Other (See Comments)    Headache Dizziness   CBC:    Component Value Date/Time   WBC 6.8 06/17/2024 0509   HGB 12.7 (L) 06/17/2024 0509   HGB 14.6 11/15/2022 0928   HCT 39.3 06/17/2024 0509   HCT 41.8 11/15/2022 0928   PLT 259 06/17/2024 0509   PLT 207 11/15/2022 0928   MCV 93.8 06/17/2024 0509   MCV 91 11/15/2022 0928   NEUTROABS 4.2 05/23/2024 1023   LYMPHSABS 1.8 05/23/2024 1023   MONOABS 0.5 05/23/2024 1023   EOSABS 0.1 05/23/2024 1023   BASOSABS 0.0 05/23/2024 1023   Comprehensive Metabolic Panel:    Component Value Date/Time   NA 139 06/19/2024 0825   NA 143 11/15/2022 0928   K 3.8 06/19/2024 0825   CL 98 06/19/2024 0825   CO2 34 (H) 06/19/2024 0825   BUN 21 06/19/2024 0825   BUN 13 11/15/2022 0928   CREATININE 0.68 06/19/2024 0825   CREATININE 0.93 07/28/2020 1026   GLUCOSE 238 (H) 06/19/2024 0825   GLUCOSE 118 (H) 10/18/2006 0000   CALCIUM  7.4 (L) 06/19/2024 0825   AST 16 01/25/2024 1136   ALT 9 01/25/2024 1136   ALKPHOS 65 01/25/2024 1136   BILITOT 0.7 01/25/2024 1136   BILITOT 0.6 11/15/2022 0931   PROT 6.9 01/25/2024 1136   PROT 6.6 11/15/2022 0931   ALBUMIN 4.3 01/25/2024 1136   ALBUMIN 4.2 11/15/2022 0931    Physical Exam: Vital Signs: BP 124/75 (BP Location: Right Arm)   Pulse 92  Temp 97.9 F (36.6 C) (Oral)   Resp 19   Ht 5' 8 (1.727 m)   Wt 70.8 kg   SpO2 96%   BMI 23.72 kg/m  SpO2: SpO2: 96 % O2 Device: O2 Device: Nasal Cannula O2 Flow Rate: O2 Flow Rate (L/min): 5 L/min Intake/output summary:  Intake/Output Summary (Last 24 hours) at 06/19/2024 1419 Last data filed at 06/19/2024 1200 Gross per 24 hour  Intake 1667 ml  Output 3575 ml  Net -1908 ml   LBM: Last BM Date : 06/19/24 Baseline Weight: Weight: 70.8 kg Most recent weight: Weight: 70.8 kg  General: NAD, alert, chronically ill-appearing Cardiovascular:  RRR Respiratory: increased work of breathing noted on 5 L/min nasal cannula Neuro: A&Ox4, following commands easily Psych: appropriately answers all questions          Palliative Performance Scale: 40%              Additional Data Reviewed: Recent Labs    06/17/24 0509 06/18/24 0425 06/19/24 0825  WBC 6.8  --   --   HGB 12.7*  --   --   PLT 259  --   --   NA 142 142 139  BUN 27* 25* 21  CREATININE 0.88 0.72 0.68    Imaging: ECHOCARDIOGRAM COMPLETE    ECHOCARDIOGRAM REPORT       Patient Name:   Bradley KOPERA Sr. Date of Exam: 06/17/2024 Medical Rec #:  [id number]         Height:       68.0 in Accession #:    [id number]        Weight:       156.0 lb Date of Birth:  05/31/1941          BSA:          1.839 m Patient Age:    83 years          BP:           128/89 mmHg Patient Gender: M                 HR:           72 bpm. Exam Location:  Inpatient  Procedure: 2D Echo, Cardiac Doppler and Color Doppler (Both Spectral and Color            Flow Doppler were utilized during procedure).  Indications:    CHF I50.31   History:        Patient has prior history of Echocardiogram examinations, most                 recent 07/10/2023. CHF, CAD, Pacemaker, COPD, Arrythmias:Atrial                 Fibrillation; Risk Factors:Diabetes, Dyslipidemia and                 Hypertension.   Sonographer:    AS Referring Phys: [id number] JESSICA U VANN  IMPRESSIONS   1. Left ventricular ejection fraction, by estimation, is 50 to 55%. The left ventricle has low normal function. Left ventricular endocardial border not optimally defined to evaluate regional wall motion. Left ventricular diastolic parameters are  indeterminate.  2. Right ventricular systolic function is mildly reduced. The right ventricular size is severely enlarged. There is severely elevated pulmonary artery systolic pressure. The estimated right ventricular systolic pressure is 63.2 mmHg.  3. Left atrial size was severely dilated.  4.  Right atrial size was severely dilated.  5. The mitral valve is normal in structure. Trivial mitral valve regurgitation. No evidence of mitral stenosis.  6. The tricuspid valve is abnormal. Tricuspid valve regurgitation is mild to moderate.  7. The aortic valve is tricuspid. Aortic valve regurgitation is not visualized. Aortic valve sclerosis/calcification is present, without any evidence of aortic stenosis. Aortic valve mean gradient measures 5.0 mmHg.  8. The inferior vena cava is dilated in size with <50% respiratory variability, suggesting right atrial pressure of 15 mmHg.  FINDINGS  Left Ventricle: Left ventricular ejection fraction, by estimation, is 50 to 55%. The left ventricle has low normal function. Left ventricular endocardial border not optimally defined to evaluate regional wall motion. Strain was performed and the global  longitudinal strain is indeterminate. The left ventricular internal cavity size was normal in size. There is no left ventricular hypertrophy. Left ventricular diastolic parameters are indeterminate.  Right Ventricle: The right ventricular size is severely enlarged. No increase in right ventricular wall thickness. Right ventricular systolic function is mildly reduced. There is severely elevated pulmonary artery systolic pressure. The tricuspid  regurgitant velocity is 3.47 m/s, and with an assumed right atrial pressure of 15 mmHg, the estimated right ventricular systolic pressure is 63.2 mmHg.  Left Atrium: Left atrial size was severely dilated.  Right Atrium: Right atrial size was severely dilated.  Pericardium: There is no evidence of pericardial effusion.  Mitral Valve: The mitral valve is normal in structure. Trivial mitral valve regurgitation. No evidence of mitral valve stenosis. MV peak gradient, 7.4 mmHg. The mean mitral valve gradient is 2.0 mmHg.  Tricuspid Valve: The tricuspid valve is abnormal. Tricuspid valve regurgitation is mild to moderate. No  evidence of tricuspid stenosis.  Aortic Valve: The aortic valve is tricuspid. Aortic valve regurgitation is not visualized. Aortic valve sclerosis/calcification is present, without any evidence of aortic stenosis. Aortic valve mean gradient measures 5.0 mmHg. Aortic valve peak gradient  measures 10.5 mmHg. Aortic valve area, by VTI measures 2.66 cm.  Pulmonic Valve: The pulmonic valve was not well visualized. Pulmonic valve regurgitation is trivial. No evidence of pulmonic stenosis.  Aorta: The aortic root and ascending aorta are structurally normal, with no evidence of dilitation.  Venous: The inferior vena cava is dilated in size with less than 50% respiratory variability, suggesting right atrial pressure of 15 mmHg.  IAS/Shunts: No atrial level shunt detected by color flow Doppler.  Additional Comments: 3D was performed not requiring image post processing on an independent workstation and was indeterminate. A device lead is visualized.    LEFT VENTRICLE PLAX 2D LVIDd:         4.80 cm LVIDs:         3.80 cm LV PW:         0.80 cm LV IVS:        0.90 cm LVOT diam:     2.30 cm LV SV:         83 LV SV Index:   45 LVOT Area:     4.15 cm   LV Volumes (MOD) LV vol d, MOD A2C: 125.0 ml LV vol d, MOD A4C: 117.0 ml LV vol s, MOD A2C: 57.9 ml LV vol s, MOD A4C: 52.2 ml LV SV MOD A2C:     67.1 ml LV SV MOD A4C:     117.0 ml LV SV MOD BP:      66.9 ml  RIGHT VENTRICLE            IVC RV Basal diam:  5.10  cm    IVC diam: 2.90 cm RV S prime:     9.03 cm/s TAPSE (M-mode): 1.7 cm  LEFT ATRIUM             Index        RIGHT ATRIUM           Index LA diam:        4.80 cm 2.61 cm/m   RA Area:     30.50 cm LA Vol (A2C):   54.0 ml 29.36 ml/m  RA Volume:   95.40 ml  51.88 ml/m LA Vol (A4C):   86.8 ml 47.20 ml/m LA Biplane Vol: 68.4 ml 37.20 ml/m  AORTIC VALVE                     PULMONIC VALVE AV Area (Vmax):    2.85 cm      PV Vmax:          0.86 m/s AV Area (Vmean):   2.69 cm       PV Peak grad:     2.9 mmHg AV Area (VTI):     2.66 cm      PR End Diast Vel: 5.24 msec AV Vmax:           162.00 cm/s AV Vmean:          103.000 cm/s AV VTI:            0.311 m AV Peak Grad:      10.5 mmHg AV Mean Grad:      5.0 mmHg LVOT Vmax:         111.00 cm/s LVOT Vmean:        66.700 cm/s LVOT VTI:          0.199 m LVOT/AV VTI ratio: 0.64   AORTA Ao Root diam: 3.40 cm Ao Asc diam:  3.30 cm  MITRAL VALVE                TRICUSPID VALVE MV Area (PHT): 5.70 cm     TR Peak grad:   48.2 mmHg MV Area VTI:   2.79 cm     TR Vmax:        347.00 cm/s MV Peak grad:  7.4 mmHg MV Mean grad:  2.0 mmHg     SHUNTS MV Vmax:       1.36 m/s     Systemic VTI:  0.20 m MV Vmean:      62.0 cm/s    Systemic Diam: 2.30 cm MV Decel Time: 133 msec MR Peak grad: 72.9 mmHg MR Vmax:      427.00 cm/s MV E velocity: 121.00 cm/s MV A velocity: 39.30 cm/s MV E/A ratio:  3.08  Bradley [name] Electronically signed by Bradley [name] Signature Date/Time: 06/17/2024/2:23:07 PM      Final      I personally reviewed recent imaging.   Palliative Care Assessment and Plan Summary of Established Goals of Care and Medical Treatment Preferences   Patient is an 83 year old male with past medical history of hypertension, type 2 diabetes mellitus, PAD, CAD, atrial fibrillation status post AV nodal ablation not on anticoagulation due to history of ICH, tachybradycardia syndrome with pacemaker, HFpEF, and chronic hypoxic and hypercarbic respiratory failure in setting of COPD on 5 L nasal cannula at home was admitted on 06/13/2024 for management of shortness of breath and cough.  Since admission patient has received management for COPD exacerbation and concern for possible volume overload  in setting of HFpEF.  Cardiology consulted for recommendations.  Palliative medicine team consulted to assist with complex medical decision making.  # Complex medical decision making/goals of care  - Extensive  discussion with patient as detailed above in HPI.  Also discussed care over speaker phone with Bradley [name] at patient's request.  Later called and updated patient's grandson, Bradley [name], at request as well.  Discussed pathways for medical care moving forward including aggressive medical management versus focusing on comfort and quality of life at home with hospice support.  Family going to discuss further to consider throughout patient would like to pursue.  Bradley [name] planning to be at bedside on 06/20/2024 at 3:30 PM.  Palliative medicine team continuing conversations as able and appropriate.  - Patient stated that he does have a living will naming his granddaughter, Bradley [name], and grandson, Bradley [name], as his medical decision makers if he is unable to make a medical decision himself.  Patient's wife passed away in August 14, 2023.  Patient's son is also deceased.  -  Code Status: Limited: Do not attempt resuscitation (DNR) -DNR-LIMITED -Do Not Intubate/DNI     - Discussed CODE STATUS with patient as detailed above in HPI.  Explained full code versus DNR/DNI.  Patient states he would want to be DNR/DNI . Bradley [name] and grandson, Bradley [name], supporting of this.  # Symptom management Patient is receiving these palliative interventions for symptom management with an intent to improve quality of life.   - Pain/shortness of breath, in setting of acute on chronic COPD and HFpEF   - Increase morphine  solution to 5 mg every 4 hours as needed.  Continue to adjust based on symptom response.  # Psycho-social/Spiritual Support:  - Support System: grandson- Bradley [name], granddaughter- Bradley [name], granddaughter's significant other- Bradley [name] Adult nurse)  # Discharge Planning:  To Be Determined  Thank you for allowing the palliative care team to participate in the care Bradley [name] JINNY Oliva WagnerSABRA Tinnie Radar, DO Palliative Care Provider PMT # [phone]  If patient remains symptomatic despite maximum doses, please call PMT at [phone] between 0700 and 1900. Outside of  these hours, please call attending, as PMT does not have night coverage.  Personally spent 90 minutes in patient care including extensive chart review (labs, imaging, progress/consult notes, vital signs), medically appropraite exam, discussed with treatment team, education to patient, family, and staff, documenting clinical information, medication review and management, coordination of care, and available advanced directive documents.

## 2024-06-20 DIAGNOSIS — J441 Chronic obstructive pulmonary disease with (acute) exacerbation: Secondary | ICD-10-CM | POA: Diagnosis not present

## 2024-06-20 DIAGNOSIS — I10 Essential (primary) hypertension: Secondary | ICD-10-CM | POA: Diagnosis not present

## 2024-06-20 DIAGNOSIS — I4819 Other persistent atrial fibrillation: Secondary | ICD-10-CM | POA: Diagnosis not present

## 2024-06-20 DIAGNOSIS — I5033 Acute on chronic diastolic (congestive) heart failure: Secondary | ICD-10-CM | POA: Diagnosis not present

## 2024-06-20 DIAGNOSIS — R079 Chest pain, unspecified: Secondary | ICD-10-CM

## 2024-06-20 DIAGNOSIS — Z79899 Other long term (current) drug therapy: Secondary | ICD-10-CM | POA: Diagnosis not present

## 2024-06-20 DIAGNOSIS — I272 Pulmonary hypertension, unspecified: Secondary | ICD-10-CM | POA: Diagnosis not present

## 2024-06-20 DIAGNOSIS — Z7189 Other specified counseling: Secondary | ICD-10-CM | POA: Diagnosis not present

## 2024-06-20 DIAGNOSIS — Z515 Encounter for palliative care: Secondary | ICD-10-CM | POA: Diagnosis not present

## 2024-06-20 DIAGNOSIS — I4891 Unspecified atrial fibrillation: Secondary | ICD-10-CM | POA: Diagnosis not present

## 2024-06-20 LAB — CREATININE, SERUM
Creatinine, Ser: 0.7 mg/dL (ref 0.61–1.24)
GFR, Estimated: >60 mL/min (ref >60)

## 2024-06-20 LAB — GLUCOSE, CAPILLARY
Glucose-Capillary: 116 mg/dL — ABNORMAL HIGH (ref 70–99)
Glucose-Capillary: 123 mg/dL — ABNORMAL HIGH (ref 70–99)
Glucose-Capillary: 151 mg/dL — ABNORMAL HIGH (ref 70–99)
Glucose-Capillary: 201 mg/dL — ABNORMAL HIGH (ref 70–99)
Glucose-Capillary: 263 mg/dL — ABNORMAL HIGH (ref 70–99)

## 2024-06-20 MED ORDER — MORPHINE SULFATE (CONCENTRATE) 10 MG /0.5 ML PO SOLN
7.5000 mg | ORAL | Status: DC | PRN
  Administered 2024-06-20 (×2): 7.6 mg via ORAL
  Filled 2024-06-20 (×2): qty 0.5

## 2024-06-20 NOTE — Progress Notes (Signed)
  Progress Note  Patient Name: Bradley GUGLIELMO Sr. Date of Encounter: 06/20/2024 [facility] Cardiologist: Annabella Scarce, MD   Interval Summary   Reports still feeling short of breath but better than yesterday  Being seen by palliative medicine today -- will follow their recs  Vital Signs Vitals:   06/19/24 1009 06/19/24 1200 06/19/24 2000 06/20/24 0517  BP: 123/76 124/75 112/64 115/78  Pulse: 70 92 75 73  Resp:   19 18  Temp: 97.9 F (36.6 C) 97.9 F (36.6 C) 98.3 F (36.8 C) 98.8 F (37.1 C)  TempSrc: Oral Oral    SpO2: 99% 96% 99% 98%  Weight:      Height:        Intake/Output Summary (Last 24 hours) at 06/20/2024 1034 Last data filed at 06/19/2024 2000 Gross per 24 hour  Intake 834 ml  Output 675 ml  Net 159 ml      06/14/2024   12:00 AM 05/23/2024    9:10 AM 04/11/2024    1:51 PM  Last 3 Weights  Weight (lbs) 156 lb 172 lb 4 oz 185 lb  Weight (kg) 70.761 kg 78.132 kg 83.915 kg     Telemetry/ECG  V-paced, HR 70s - Personally Reviewed  Physical Exam  GEN: No acute distress.   Neck: No JVD Cardiac: RRR, no murmurs, rubs, or gallops.  Respiratory: decreased at bases. GI: Soft, nontender, non-distended  MS: No edema  Assessment & Plan  Acute on chronic diastolic heart failure Mild RV dysfunction Pulmonary HTN COPD, on O2 at home  Echo: LVEF 50-55%, PASP 63 mmHg, mild RV dysfunction, severe RV enlargement, severe BAE, mild to moderate TR, RAP 15 mmHg LHC 09/2022: EF 50-55%, normal LVEDP, mild pulmonary HTN, minimal nonobstructive CAD, suspect group 2/3 PFTs 2021: severe restrictive lung disease, moderately severe diffusion defect Echo remains fairly unchanged from year prior  CTPA negative for PE ProBNP 6,826 Net - 7 L with 1.7 L out yesterday  Renal function has been stable  Currently on IV Lasix  40 mg BID, patient likely able to switch over to PO maintenance diuresis at this point  Recommend outpatient split-night sleep study per MD Continue  daily weights, strict I&O's, daily BMPs Would benefit from outpatient AHF referral at discharge    Persistent atrial fibrillation Sick sinus syndrome s/p PPM No long-term AC due to ICH history  Currently in A. Fib with HR 70s Continue bisoprolol  10 mg daily    Chest pain Troponin < 15 Patient reports chronic chest pain Suspect related to his COPD Echo: LVEF 50-55% LHC 09/2022: minimal nonobstructive CAD No further ischemic workup at this time  Consider further eval if chest pain persists with control of COPD   For questions or updates, please contact [facility] Please consult [email or web address] for contact info under       Signed, Waddell DELENA Donath, PA-C

## 2024-06-20 NOTE — TOC Progression Note (Signed)
 Transition of Care (TOC) - Progression Note    Patient Details  Name: Bradley [name] Sr. MRN: [id number] Date of Birth: Feb 13, 1941  Transition of Care [facility]) CM/SW Contact  Suleyma Wafer, Nathanel, RN Phone Number: 06/20/2024, 12:32 PM  Clinical Narrative:Grand dtr(Amber)/Grand son(Ralph) in law chose [facility] to [facility] Hiliary will review await outcome. On 02. Informed [place] that since CPAP was being followed by DME company PTA-they should continue to f/u w/that same dme company-I contacted [employer] rep Mitch if they were following-they were not.  Has own transport home.      Expected Discharge Plan: Home w Hospice Care Barriers to Discharge: Continued Medical Work up               Expected Discharge Plan and Services   Discharge Planning Services: CM Consult   Living arrangements for the past 2 months: Single Family Home                                       Social Drivers of Health (SDOH) Interventions SDOH Screenings   Food Insecurity: No Food Insecurity (06/13/2024)  Housing: Low Risk  (06/13/2024)  Transportation Needs: No Transportation Needs (06/13/2024)  Utilities: Not At Risk (06/13/2024)  Alcohol Screen: Low Risk  (04/11/2024)  Depression (PHQ2-9): Low Risk  (05/23/2024)  Financial Resource Strain: Low Risk  (04/11/2024)  Physical Activity: Insufficiently Active (04/11/2024)  Social Connections: Moderately Isolated (06/13/2024)  Stress: No Stress Concern Present (04/11/2024)  Tobacco Use: Medium Risk (06/13/2024)  Health Literacy: Adequate Health Literacy (04/11/2024)    Readmission Risk Interventions    07/07/2023    2:33 PM 08/29/2022    3:33 PM  Readmission Risk Prevention Plan  Transportation Screening Complete Complete  PCP or Specialist Appt within 3-5 Days Complete Complete  HRI or Home Care Consult Complete Complete  Social Work Consult for Recovery Care Planning/Counseling Complete Complete  Palliative Care Screening Not Applicable Not  Applicable  Medication Review Oceanographer) Referral to Pharmacy Complete

## 2024-06-20 NOTE — Progress Notes (Signed)
 Daily Progress Note   Patient Name: Bradley PLASENCIA Sr.       Date: 06/20/2024 DOB: 05-20-1941  Age: 83 y.o. MRN#: [id number] Attending Physician: Jadine Toribio SQUIBB, MD Primary Care Physician: Amon Aloysius BRAVO, MD Admit Date: 06/13/2024 Length of Stay: 6 days  Reason for Consultation/Follow-up: Establishing goals of care  Subjective:   CC: Patient notes breathing slightly improved.  Following up regarding complex medical decision making.  Subjective:  Reviewed EMR including recent documentation from hospitalist and cardiologist. At time of EMR review in past 24 hours patient has received as needed morphine  solution 5 mg x 2 doses..  Presented to bedside to see patient.  Patient's granddaughter, Jeoffrey, present at bedside.  Able to introduce myself as a member of the palliative medicine team.  Able to review patient's symptoms at this time.  Patient did feel the morphine  helped with his breathing though did not alleviate it enough.  Noted could increase dosing again.  Noted patient should inform provider if needs to be further increased beyond this.  Patient denying any adverse effects such as lethargy or confusion.  Discussed planning moving forward.  Had scheduled to meet with [place] at 3:30 PM today though Amber noted that family has already come together to discuss plans about patient going home with support.  Spent time again reviewing with patient and Amber at bedside what hospice would and would not entail and the philosophy of hospice to focus on quality of life and comfort at home.  After discussion, patient noted that he would want to proceed with getting hospice support at home.  Family and patient specifically requested use of [facility] since they took care of patient's wife when she passed away last year.  Noted would inform TOC. Patient and daughter stated patient will need oxygen support at home.  Discussed how hospice could assist with DME requirements.  Answer questions as  able at that time.  Provided emotional support via active listening.  Noted palliative medicine team continue to follow with patient's medical journey.  Discussed care with hospitalist, TOC, and RN to coordinate care.   Objective:   Vital Signs:  BP 115/78 (BP Location: Right Arm)   Pulse 73   Temp 98.8 F (37.1 C)   Resp 18   Ht 5' 8 (1.727 m)   Wt 70.8 kg   SpO2 98%   BMI 23.72 kg/m   Physical Exam: General: NAD, alert, chronically ill-appearing Cardiovascular: RRR Respiratory: increased work of breathing noted on 5 L/min nasal cannula Neuro: A&Ox4, following commands easily Psych: appropriately answers all questions    Assessment & Plan:   Assessment: Patient is an 83 year old male with past medical history of hypertension, type 2 diabetes mellitus, PAD, CAD, atrial fibrillation status post AV nodal ablation not on anticoagulation due to history of ICH, tachybradycardia syndrome with pacemaker, HFpEF, and chronic hypoxic and hypercarbic respiratory failure in setting of COPD on 5 L nasal cannula at home was admitted on 06/13/2024 for management of shortness of breath and cough. Since admission patient has received management for COPD exacerbation and concern for possible volume overload in setting of HFpEF. Cardiology consulted for recommendations. Palliative medicine team consulted to assist with complex medical decision making.   Recommendations/Plan: # Complex medical decision making/goals of care:  - Extensive discussion with patient with granddaughter, Amber, at bedside as detailed above in HPI.  Amber noted she plan to speak to her brother, Perseus, regarding this so did not  place him  on speaker phone for conversation.  Family has talked and they agree with supporting patient, with hospice.  Patient also agreeing with returning home with hospice support.  Due to involvement of [facility] since they help to take care of patient's wife when she passed last year.   Involved TOC to assist with coordination of care.  PMT continuing to follow along to engage in conversations as able and appropriate moving forward             - Patient stated that he does have a living will naming his granddaughter, Jeoffrey, and grandson, Aksh, as his medical decision makers if he is unable to make a medical decision himself.  Patient's wife passed away in 08-10-2023.  Patient's son is also deceased.                -  Code Status: Limited: Do not attempt resuscitation (DNR) -DNR-LIMITED -Do Not Intubate/DNI                                 - Placed signed DNR form on patient's chart.    # Symptom management Patient is receiving these palliative interventions for symptom management with an intent to improve quality of life.                 - Pain/shortness of breath, in setting of acute on chronic COPD and HFpEF                               - Increase morphine  solution to 7.5 mg every 4 hours as needed.  Continue to adjust based on symptom response.   # Psycho-social/Spiritual Support:  - Support System: grandson- Kei, granddaughter- Hospital doctor, granddaughter's significant other- [place] Adult nurse)  # Discharge Planning: Home with Hospice  Discussed with: patient, granddaughter- Hospital doctor, hospitalist, RN, [facility]  Thank you for allowing the palliative care team to participate in the care Norleen JINNY Oliva WagnerSABRA Tinnie Radar, DO Palliative Care Provider PMT # [phone]  If patient remains symptomatic despite maximum doses, please call PMT at [phone] between 0700 and 1900. Outside of these hours, please call attending, as PMT does not have night coverage.  Billing based on MDM: High  Problems Addressed: One or more chronic illnesses with severe exacerbation, progression, or side effects of treatment.  Risks: Decision regarding hospitalization or escalation of hospital care, decision to go home with hospice

## 2024-06-20 NOTE — Progress Notes (Addendum)
  Progress Note   Patient: Bradley [name] FMW:[id number] DOB: 12-29-1940 DOA: 06/13/2024     6 DOS: the patient was seen and examined on 06/20/2024   Brief hospital course:  Assessment and Plan: COPD exacerbation; chronic hypoxic & hypercarbic respiratory failure  - Transitioned to oral steroids - Has been weaned back to home 5 L - Scheduled nebs and as needed - Trial of liquid morphine    Acute on chronic HFpEF with known pulm HTN -echo similar to prior - cards consult appreciated   Chest pain -appears somewhat reproductive and probably associated with COPD exacerbation    Atrial fibrillation  - S/p AV nodal ablation; has not been anticoagulated d/t hx of ICH  - Continue bisoprolol      CAD  - No recent angina  - Continue ASA and Lipitor     Hypertension  - Norvasc  -- decrease dose to allow for diuresis    Type II DM  - A1c was 6.5% in August 2025   Plan Home with hospice    Subjective:  Feels ok now Breathing ok now Planning on going home with hospice  Physical Exam: Vitals:   06/19/24 1009 06/19/24 1200 06/19/24 2000 06/20/24 0517  BP: 123/76 124/75 112/64 115/78  Pulse: 70 92 75 73  Resp:   19 18  Temp: 97.9 F (36.6 C) 97.9 F (36.6 C) 98.3 F (36.8 C) 98.8 F (37.1 C)  TempSrc: Oral Oral    SpO2: 99% 96% 99% 98%  Weight:      Height:       Physical Exam Vitals reviewed.  Constitutional:      General: He is not in acute distress.    Appearance: He is not ill-appearing or toxic-appearing.  Cardiovascular:     Rate and Rhythm: Normal rate and regular rhythm.     Heart sounds: No murmur heard. Pulmonary:     Effort: Pulmonary effort is normal. No respiratory distress.     Breath sounds: No wheezing, rhonchi or rales.  Neurological:     Mental Status: He is alert.  Psychiatric:        Mood and Affect: Mood normal.        Behavior: Behavior normal.     Data Reviewed: CBG stable  Family Communication: son-in-law  Disposition: Status is:  Inpatient Remains inpatient appropriate because: plan home with hospice     Time spent: 35 minutes  Author: Toribio Door, MD 06/20/2024 11:12 AM  For on call review [email or web address].

## 2024-06-21 DIAGNOSIS — J9611 Chronic respiratory failure with hypoxia: Secondary | ICD-10-CM | POA: Diagnosis not present

## 2024-06-21 DIAGNOSIS — Z515 Encounter for palliative care: Secondary | ICD-10-CM | POA: Diagnosis not present

## 2024-06-21 DIAGNOSIS — I50810 Right heart failure, unspecified: Secondary | ICD-10-CM | POA: Diagnosis not present

## 2024-06-21 DIAGNOSIS — J441 Chronic obstructive pulmonary disease with (acute) exacerbation: Secondary | ICD-10-CM | POA: Diagnosis not present

## 2024-06-21 DIAGNOSIS — I5033 Acute on chronic diastolic (congestive) heart failure: Secondary | ICD-10-CM | POA: Diagnosis not present

## 2024-06-21 DIAGNOSIS — I4819 Other persistent atrial fibrillation: Secondary | ICD-10-CM | POA: Diagnosis not present

## 2024-06-21 LAB — BASIC METABOLIC PANEL WITH GFR
Anion gap: 12 (ref 5–15)
BUN: 18 mg/dL (ref 8–23)
CO2: 29 mmol/L (ref 22–32)
Calcium: 6.9 mg/dL — ABNORMAL LOW (ref 8.9–10.3)
Chloride: 98 mmol/L (ref 98–111)
Creatinine, Ser: 0.62 mg/dL (ref 0.61–1.24)
GFR, Estimated: >60 mL/min (ref >60)
Glucose, Bld: 171 mg/dL — ABNORMAL HIGH (ref 70–99)
Potassium: 3.5 mmol/L (ref 3.5–5.1)
Sodium: 138 mmol/L (ref 135–145)

## 2024-06-21 LAB — MAGNESIUM: Magnesium: 2.9 mg/dL — ABNORMAL HIGH (ref 1.7–2.4)

## 2024-06-21 LAB — GLUCOSE, CAPILLARY
Glucose-Capillary: 197 mg/dL — ABNORMAL HIGH (ref 70–99)
Glucose-Capillary: 247 mg/dL — ABNORMAL HIGH (ref 70–99)
Glucose-Capillary: 158 mg/dL — ABNORMAL HIGH (ref 70–99)

## 2024-06-21 MED ORDER — PREDNISONE 20 MG PO TABS: ORAL_TABLET | ORAL | 0 refills | Status: AC

## 2024-06-21 MED ORDER — FUROSEMIDE 40 MG PO TABS: 40.0000 mg | ORAL_TABLET | Freq: Two times a day (BID) | ORAL | Status: DC

## 2024-06-21 MED ORDER — MORPHINE SULFATE (CONCENTRATE) 10 MG /0.5 ML PO SOLN: 5.0000 mg | ORAL | 0 refills | Status: DC | PRN

## 2024-06-21 MED ORDER — MORPHINE SULFATE (CONCENTRATE) 10 MG /0.5 ML PO SOLN: 10.0000 mg | ORAL | Status: DC | PRN

## 2024-06-21 MED ORDER — FUROSEMIDE 40 MG PO TABS: 40.0000 mg | ORAL_TABLET | Freq: Two times a day (BID) | ORAL | 0 refills | Status: DC

## 2024-06-21 NOTE — Discharge Summary (Signed)
 Physician Discharge Summary   Patient: Bradley JIRAK Sr. MRN: [id number] DOB: [birth year]  Admit date:     06/13/2024  Discharge date: 06/21/24  Discharge Physician: Toribio Door   PCP: Amon Aloysius BRAVO, MD   Recommendations at discharge:   Home with hospice for COPD and CHF  Discharge Diagnoses: Principal Problem:   COPD exacerbation [facility]) Active Problems:   Type II diabetes mellitus (HCC)   Primary hypertension   Pulmonary HTN (HCC)   Atrial fibrillation (HCC)   Chronic respiratory failure with hypoxia and hypercapnia (HCC)   Chronic diastolic heart failure (HCC)   SSS (sick sinus syndrome) (HCC)   Chest pain of uncertain etiology   CAD (coronary artery disease)   RVF (right ventricular failure) (HCC)   Acute on chronic diastolic CHF (congestive heart failure) (HCC)   DNR (do not resuscitate)   Shortness of breath   Medication management   Counseling and coordination of care   Goals of care, counseling/discussion   Palliative care encounter   Chest pain  Resolved Problems:   * No resolved hospital problems. *  Hospital Course: 83 year old man PMH including COPD, chronic hypoxic respiratory failure on 5 L, chronic diastolic CHF presenting with cough and shortness of breath.  Admitted for COPD exacerbation, also treated for acute on chronic diastolic CHF.  Seen by cardiology.  Condition slowly improved and stabilized.  He was also seen by palliative medicine and after further discussion elected to go home with hospice.  Consultants Cardiology  Palliative medicine  Procedures/Events   Acute on chronic diastolic heart failure  Mild RV dysfunction Pulmonary hypertension Appears euvolemic now.  Transition to Lasix  40 mg twice daily.  Home with hospice.  COPD exacerbation; chronic hypoxic & hypercarbic respiratory failure  Complete oral steroid taper.   Chest pain Suspect this is more pleuritic related to his COPD exacerbation he says he has been having it for years No  further ischemic workup at this time but if he continues to have episodes of chest pain once COPD exacerbation is resolved   Persistent Afib SSS s/p PPM - History of prior ablation - no OAC due to ICH history - HR controlled - Bisoprolol  10 mg daily  Hypertension Stable. Home dose of amlodipine  held to allow for more blood pressure room for titration of heart failure meds and beta-blocker for A-fib Continue bisoprolol  10 mg daily   CAD  - No recent angina  - Continue ASA and Lipitor     Hypertension  - Norvasc  -- decrease dose to allow for diuresis    Type II DM  - A1c was 6.5% in August 2025   Disposition: Home with hospice Diet recommendation:  Regular   DISCHARGE MEDICATION: Allergies as of 06/21/2024       Reactions   Hydrocodone  Other (See Comments)   given to him in the hospital; went thru withdrawals once home; dr said not to take it again (09/27/2012) dizziness   Ultram  [tramadol ] Other (See Comments)   given to him in the hospital; went thru withdrawals once home; dr said not to take it again (09/27/2012) dizziness   Cialis [tadalafil] Other (See Comments)   Headache Backache  Dizziness    Avelox  [moxifloxacin  Hydrochloride] Itching, Other (See Comments)   Headache Dizziness        Medication List     STOP taking these medications    amLODipine  10 MG tablet Commonly known as: NORVASC    calcium -vitamin D  500-200 MG-UNIT tablet Commonly known as: OSCAL WITH  D   cholecalciferol  25 MCG (1000 UNIT) tablet Commonly known as: VITAMIN D3   multivitamins ther. w/minerals Tabs tablet       TAKE these medications    acetaminophen  500 MG tablet Commonly known as: TYLENOL  Take 1,000 mg by mouth every 8 (eight) hours as needed for headache or moderate pain.   aspirin  81 MG tablet Take 81 mg by mouth every morning.   atorvastatin  40 MG tablet Commonly known as: LIPITOR TAKE 1 TABLET AT BEDTIME   bisoprolol  10 MG tablet Commonly known as:  ZEBETA  Take 1 tablet (10 mg total) by mouth daily.   dapagliflozin  propanediol 10 MG Tabs tablet Commonly known as: FARXIGA  Take 1 tablet (10 mg total) by mouth daily before breakfast.   diphenhydramine -acetaminophen  25-500 MG Tabs tablet Commonly known as: TYLENOL  PM Take 1 tablet by mouth at bedtime.   furosemide  40 MG tablet Commonly known as: LASIX  Take 1 tablet (40 mg total) by mouth 2 (two) times daily. What changed: See the new instructions.   ipratropium-albuterol  0.5-2.5 (3) MG/3ML Soln Commonly known as: DUONEB Take 3 mLs by nebulization every 6 (six) hours as needed (for shortness of breath).   Combivent  Respimat 20-100 MCG/ACT Aers respimat Generic drug: Ipratropium-Albuterol  Inhale 1 puff into the lungs every 6 (six) hours as needed for wheezing or shortness of breath.   morphine  CONCENTRATE 10 mg / 0.5 ml concentrated solution Take 0.25 mLs (5 mg total) by mouth every 4 (four) hours as needed for severe pain (pain score 7-10) or moderate pain (pain score 4-6) (shortness of breath).   nitroGLYCERIN  0.4 MG SL tablet Commonly known as: NITROSTAT  Place 1 tablet (0.4 mg total) under the tongue every 5 (five) minutes x 3 doses as needed for chest pain.   onetouch ultrasoft lancets Check blood sugars no more than twice daily   OneTouch Verio test strip Generic drug: glucose blood USE TO CHECK BLOOD SUGAR NO MORE THAN TWICE A DAY   OXYGEN Inhale 5 L/min into the lungs continuous.   pantoprazole  40 MG tablet Commonly known as: PROTONIX  Take 1 tablet (40 mg total) by mouth daily before breakfast.   predniSONE  20 MG tablet Commonly known as: DELTASONE  Take 2 tablets (40 mg total) by mouth daily with breakfast for 3 days, THEN 1 tablet (20 mg total) daily with breakfast for 3 days, THEN 0.5 tablets (10 mg total) daily with breakfast for 3 days. Start taking on: June 22, 2024   tamsulosin  0.4 MG Caps capsule Commonly known as: FLOMAX  Take 1 capsule (0.4 mg  total) by mouth daily after supper.        Follow-up Information     Amon Aloysius BRAVO, MD Follow up.   Specialty: Internal Medicine Why: As needed Contact information: [address] [place] [address] [phone]                Discharge Exam: Filed Weights   06/14/24 0000  Weight: 70.8 kg   Physical Exam Vitals reviewed.  Constitutional:      General: He is not in acute distress.    Appearance: He is not ill-appearing or toxic-appearing.     Comments: Eating dinner  Cardiovascular:     Rate and Rhythm: Normal rate and regular rhythm.     Heart sounds: No murmur heard. Pulmonary:     Effort: Pulmonary effort is normal. No respiratory distress.     Breath sounds: No wheezing, rhonchi or rales.  Neurological:  Mental Status: He is alert.  Psychiatric:        Mood and Affect: Mood normal.        Behavior: Behavior normal.      Condition at discharge: good  The results of significant diagnostics from this hospitalization (including imaging, microbiology, ancillary and laboratory) are listed below for reference.   Imaging Studies: ECHOCARDIOGRAM COMPLETE Result Date: 06/17/2024    ECHOCARDIOGRAM REPORT   Patient Name:   Bradley RADFORD Sr. Date of Exam: 06/17/2024 Medical Rec #:  [id number]         Height:       68.0 in Accession #:    [id number]        Weight:       156.0 lb Date of Birth:  07/09/1941          BSA:          1.839 m Patient Age:    83 years          BP:           128/89 mmHg Patient Gender: M                 HR:           72 bpm. Exam Location:  Inpatient Procedure: 2D Echo, Cardiac Doppler and Color Doppler (Both Spectral and Color            Flow Doppler were utilized during procedure). Indications:    CHF I50.31  History:        Patient has prior history of Echocardiogram examinations, most                 recent 07/10/2023. CHF, CAD, Pacemaker, COPD, Arrythmias:Atrial                 Fibrillation; Risk Factors:Diabetes, Dyslipidemia and                  Hypertension.  Sonographer:    AS Referring Phys: [id number] JESSICA U VANN IMPRESSIONS  1. Left ventricular ejection fraction, by estimation, is 50 to 55%. The left ventricle has low normal function. Left ventricular endocardial border not optimally defined to evaluate regional wall motion. Left ventricular diastolic parameters are indeterminate.  2. Right ventricular systolic function is mildly reduced. The right ventricular size is severely enlarged. There is severely elevated pulmonary artery systolic pressure. The estimated right ventricular systolic pressure is 63.2 mmHg.  3. Left atrial size was severely dilated.  4. Right atrial size was severely dilated.  5. The mitral valve is normal in structure. Trivial mitral valve regurgitation. No evidence of mitral stenosis.  6. The tricuspid valve is abnormal. Tricuspid valve regurgitation is mild to moderate.  7. The aortic valve is tricuspid. Aortic valve regurgitation is not visualized. Aortic valve sclerosis/calcification is present, without any evidence of aortic stenosis. Aortic valve mean gradient measures 5.0 mmHg.  8. The inferior vena cava is dilated in size with <50% respiratory variability, suggesting right atrial pressure of 15 mmHg. FINDINGS  Left Ventricle: Left ventricular ejection fraction, by estimation, is 50 to 55%. The left ventricle has low normal function. Left ventricular endocardial border not optimally defined to evaluate regional wall motion. Strain was performed and the global longitudinal strain is indeterminate. The left ventricular internal cavity size was normal in size. There is no left ventricular hypertrophy. Left ventricular diastolic parameters are indeterminate. Right Ventricle: The right ventricular size is severely enlarged. No increase in right ventricular wall thickness. Right  ventricular systolic function is mildly reduced. There is severely elevated pulmonary artery systolic pressure. The tricuspid regurgitant  velocity is 3.47 m/s, and with an assumed right atrial pressure of 15 mmHg, the estimated right ventricular systolic pressure is 63.2 mmHg. Left Atrium: Left atrial size was severely dilated. Right Atrium: Right atrial size was severely dilated. Pericardium: There is no evidence of pericardial effusion. Mitral Valve: The mitral valve is normal in structure. Trivial mitral valve regurgitation. No evidence of mitral valve stenosis. MV peak gradient, 7.4 mmHg. The mean mitral valve gradient is 2.0 mmHg. Tricuspid Valve: The tricuspid valve is abnormal. Tricuspid valve regurgitation is mild to moderate. No evidence of tricuspid stenosis. Aortic Valve: The aortic valve is tricuspid. Aortic valve regurgitation is not visualized. Aortic valve sclerosis/calcification is present, without any evidence of aortic stenosis. Aortic valve mean gradient measures 5.0 mmHg. Aortic valve peak gradient measures 10.5 mmHg. Aortic valve area, by VTI measures 2.66 cm. Pulmonic Valve: The pulmonic valve was not well visualized. Pulmonic valve regurgitation is trivial. No evidence of pulmonic stenosis. Aorta: The aortic root and ascending aorta are structurally normal, with no evidence of dilitation. Venous: The inferior vena cava is dilated in size with less than 50% respiratory variability, suggesting right atrial pressure of 15 mmHg. IAS/Shunts: No atrial level shunt detected by color flow Doppler. Additional Comments: 3D was performed not requiring image post processing on an independent workstation and was indeterminate. A device lead is visualized.  LEFT VENTRICLE PLAX 2D LVIDd:         4.80 cm LVIDs:         3.80 cm LV PW:         0.80 cm LV IVS:        0.90 cm LVOT diam:     2.30 cm LV SV:         83 LV SV Index:   45 LVOT Area:     4.15 cm  LV Volumes (MOD) LV vol d, MOD A2C: 125.0 ml LV vol d, MOD A4C: 117.0 ml LV vol s, MOD A2C: 57.9 ml LV vol s, MOD A4C: 52.2 ml LV SV MOD A2C:     67.1 ml LV SV MOD A4C:     117.0 ml LV SV MOD  BP:      66.9 ml RIGHT VENTRICLE            IVC RV Basal diam:  5.10 cm    IVC diam: 2.90 cm RV S prime:     9.03 cm/s TAPSE (M-mode): 1.7 cm LEFT ATRIUM             Index        RIGHT ATRIUM           Index LA diam:        4.80 cm 2.61 cm/m   RA Area:     30.50 cm LA Vol (A2C):   54.0 ml 29.36 ml/m  RA Volume:   95.40 ml  51.88 ml/m LA Vol (A4C):   86.8 ml 47.20 ml/m LA Biplane Vol: 68.4 ml 37.20 ml/m  AORTIC VALVE                     PULMONIC VALVE AV Area (Vmax):    2.85 cm      PV Vmax:          0.86 m/s AV Area (Vmean):   2.69 cm      PV Peak grad:  2.9 mmHg AV Area (VTI):     2.66 cm      PR End Diast Vel: 5.24 msec AV Vmax:           162.00 cm/s AV Vmean:          103.000 cm/s AV VTI:            0.311 m AV Peak Grad:      10.5 mmHg AV Mean Grad:      5.0 mmHg LVOT Vmax:         111.00 cm/s LVOT Vmean:        66.700 cm/s LVOT VTI:          0.199 m LVOT/AV VTI ratio: 0.64  AORTA Ao Root diam: 3.40 cm Ao Asc diam:  3.30 cm MITRAL VALVE                TRICUSPID VALVE MV Area (PHT): 5.70 cm     TR Peak grad:   48.2 mmHg MV Area VTI:   2.79 cm     TR Vmax:        347.00 cm/s MV Peak grad:  7.4 mmHg MV Mean grad:  2.0 mmHg     SHUNTS MV Vmax:       1.36 m/s     Systemic VTI:  0.20 m MV Vmean:      62.0 cm/s    Systemic Diam: 2.30 cm MV Decel Time: 133 msec MR Peak grad: 72.9 mmHg MR Vmax:      427.00 cm/s MV E velocity: 121.00 cm/s MV A velocity: 39.30 cm/s MV E/A ratio:  3.08 Vishnu Priya Mallipeddi Electronically signed by Diannah Late Mallipeddi Signature Date/Time: 06/17/2024/2:23:07 PM    Final    CT Angio Chest PE W/Cm &/Or Wo Cm Result Date: 06/13/2024 CLINICAL DATA:  Home oxygen therapy. COPD. Shortness of breath. Cough. Hypoxia. EXAM: CT ANGIOGRAPHY CHEST WITH CONTRAST TECHNIQUE: Multidetector CT imaging of the chest was performed using the standard protocol during bolus administration of intravenous contrast. Multiplanar CT image reconstructions and MIPs were obtained to evaluate the vascular  anatomy. RADIATION DOSE REDUCTION: This exam was performed according to the departmental dose-optimization program which includes automated exposure control, adjustment of the mA and/or kV according to patient size and/or use of iterative reconstruction technique. CONTRAST:  80mL OMNIPAQUE  IOHEXOL  350 MG/ML SOLN COMPARISON:  05/26/2023 FINDINGS: Despite efforts by the technologist and patient, motion artifact is present on today's exam and could not be eliminated. This reduces exam sensitivity and specificity. Cardiovascular: No filling defect is identified in the pulmonary arterial tree to suggest pulmonary embolus. Coronary, aortic arch, and branch vessel atherosclerotic vascular disease. Moderate to prominent cardiomegaly especially involving the right atrium. Pacer noted. Mediastinum/Nodes: Borderline enlarged AP window lymph node 1.0 cm in short axis on image 46 series 5, previously 0.9 cm. Right lower paratracheal node mildly enlarged 1.2 cm in short axis on image 41 series 5, formerly 1.0 cm. Similar mildly prominent subcarinal lymph node. Lungs/Pleura: Chronic mosaic attenuation in the lungs. Scarring in the right middle lobe and along the minor fissure. Calcified pleural plaque along the diaphragm with pleural thickening laterally along the right lower lobe and right middle lobe similar to previous. Chronic volume loss and atelectasis in the lingula. Chronic lateral pleural thickening at the left lung base. Upper Abdomen: Abdominal aortic atherosclerosis. Musculoskeletal: Old rib deformities compatible with prior fractures. Suspected late phase healing fracture the right anterior sixth rib on image 84 series 13. Thoracic spondylosis.  Review of the MIP images confirms the above findings. IMPRESSION: 1. No filling defect is identified in the pulmonary arterial tree to suggest pulmonary embolus. 2. Chronic mosaic attenuation in the lungs, possibly from small airways disease. 3. Chronic pleural thickening and  calcified pleural plaque along the right diaphragm. 4. Moderate to prominent cardiomegaly especially involving the right atrium. 5. Mildly enlarged mediastinal lymph nodes, nonspecific. 6. Suspected late phase healing fracture of the right anterior sixth rib. 7.  Aortic Atherosclerosis (ICD10-I70.0). Electronically Signed   By: Ryan Salvage M.D.   On: 06/13/2024 18:38   DG Chest Portable 1 View Result Date: 06/13/2024 CLINICAL DATA:  shortness of breath EXAM: PORTABLE CHEST - 1 VIEW COMPARISON:  October 03, 2023 FINDINGS: Bilateral perihilar interstitial opacities. No lobar consolidation or pneumothorax. Small right pleural effusion. Possible trace left pleural effusion. Mild cardiomegaly. Left chest pacemaker with leads terminating in the right ventricle. Tortuous aorta with aortic atherosclerosis. No acute fracture or destructive lesions. Multilevel thoracic osteophytosis. IMPRESSION: Bilateral perihilar interstitial opacities, which may represent interstitial edema or atypical/viral infection, in the correct clinical context. Electronically Signed   By: Rogelia Myers M.D.   On: 06/13/2024 17:10    Microbiology: Results for orders placed or performed during the hospital encounter of 06/13/24  Resp panel by RT-PCR (RSV, Flu A&B, Covid) Anterior Nasal Swab     Status: None   Collection Time: 06/13/24  5:15 PM   Specimen: Anterior Nasal Swab  Result Value Ref Range Status   SARS Coronavirus 2 by RT PCR NEGATIVE NEGATIVE Final    Comment: (NOTE) SARS-CoV-2 target nucleic acids are NOT DETECTED.  The SARS-CoV-2 RNA is generally detectable in upper respiratory specimens during the acute phase of infection. The lowest concentration of SARS-CoV-2 viral copies this assay can detect is 138 copies/mL. A negative result does not preclude SARS-Cov-2 infection and should not be used as the sole basis for treatment or other patient management decisions. A negative result may occur with  improper  specimen collection/handling, submission of specimen other than nasopharyngeal swab, presence of viral mutation(s) within the areas targeted by this assay, and inadequate number of viral copies(<138 copies/mL). A negative result must be combined with clinical observations, patient history, and epidemiological information. The expected result is Negative.  Fact Sheet for Patients:  BloggerCourse.com  Fact Sheet for Healthcare Providers:  SeriousBroker.it  This test is no t yet approved or cleared by the [place]  FDA and  has been authorized for detection and/or diagnosis of SARS-CoV-2 by FDA under an Emergency Use Authorization (EUA). This EUA will remain  in effect (meaning this test can be used) for the duration of the COVID-19 declaration under Section 564(b)(1) of the Act, 21 U.S.C.section 360bbb-3(b)(1), unless the authorization is terminated  or revoked sooner.       Influenza A by PCR NEGATIVE NEGATIVE Final   Influenza B by PCR NEGATIVE NEGATIVE Final    Comment: (NOTE) The Xpert Xpress SARS-CoV-2/FLU/RSV plus assay is intended as an aid in the diagnosis of influenza from Nasopharyngeal swab specimens and should not be used as a sole basis for treatment. Nasal washings and aspirates are unacceptable for Xpert Xpress SARS-CoV-2/FLU/RSV testing.  Fact Sheet for Patients: BloggerCourse.com  Fact Sheet for Healthcare Providers: SeriousBroker.it  This test is not yet approved or cleared by the [place]  FDA and has been authorized for detection and/or diagnosis of SARS-CoV-2 by FDA under an Emergency Use Authorization (EUA). This EUA will remain in effect (meaning this test can  be used) for the duration of the COVID-19 declaration under Section 564(b)(1) of the Act, 21 U.S.C. section 360bbb-3(b)(1), unless the authorization is terminated or revoked.     Resp  Syncytial Virus by PCR NEGATIVE NEGATIVE Final    Comment: (NOTE) Fact Sheet for Patients: BloggerCourse.com  Fact Sheet for Healthcare Providers: SeriousBroker.it  This test is not yet approved or cleared by the [place]  FDA and has been authorized for detection and/or diagnosis of SARS-CoV-2 by FDA under an Emergency Use Authorization (EUA). This EUA will remain in effect (meaning this test can be used) for the duration of the COVID-19 declaration under Section 564(b)(1) of the Act, 21 U.S.C. section 360bbb-3(b)(1), unless the authorization is terminated or revoked.  Performed at [facility], [address] [place], [place] [address]   Respiratory (~20 pathogens) panel by PCR     Status: None   Collection Time: 06/14/24  8:57 AM   Specimen: Nasopharyngeal Swab; Respiratory  Result Value Ref Range Status   Adenovirus NOT DETECTED NOT DETECTED Final   Coronavirus 229E NOT DETECTED NOT DETECTED Final    Comment: (NOTE) The Coronavirus on the Respiratory Panel, DOES NOT test for the novel  Coronavirus (2019 nCoV)    Coronavirus HKU1 NOT DETECTED NOT DETECTED Final   Coronavirus NL63 NOT DETECTED NOT DETECTED Final   Coronavirus OC43 NOT DETECTED NOT DETECTED Final   Metapneumovirus NOT DETECTED NOT DETECTED Final   Rhinovirus / Enterovirus NOT DETECTED NOT DETECTED Final   Influenza A NOT DETECTED NOT DETECTED Final   Influenza B NOT DETECTED NOT DETECTED Final   Parainfluenza Virus 1 NOT DETECTED NOT DETECTED Final   Parainfluenza Virus 2 NOT DETECTED NOT DETECTED Final   Parainfluenza Virus 3 NOT DETECTED NOT DETECTED Final   Parainfluenza Virus 4 NOT DETECTED NOT DETECTED Final   Respiratory Syncytial Virus NOT DETECTED NOT DETECTED Final   Bordetella pertussis NOT DETECTED NOT DETECTED Final   Bordetella Parapertussis NOT DETECTED NOT DETECTED Final   Chlamydophila pneumoniae NOT DETECTED NOT DETECTED  Final   Mycoplasma pneumoniae NOT DETECTED NOT DETECTED Final    Comment: Performed at [facility], [address] [place], [place] [address]   *Note: Due to a large number of results and/or encounters for the requested time period, some results have not been displayed. A complete set of results can be found in Results Review.    Labs: CBC: Recent Labs  Lab 06/15/24 0420 06/16/24 0540 06/17/24 0509  WBC 9.2 7.5 6.8  HGB 12.4* 12.0* 12.7*  HCT 38.6* 38.6* 39.3  MCV 94.4 93.7 93.8  PLT 256 263 259   Basic Metabolic Panel: Recent Labs  Lab 06/16/24 0540 06/17/24 0509 06/18/24 0425 06/19/24 0825 06/20/24 0508 06/21/24 0446  NA 141 142 142 139  --  138  K 4.1 4.0 4.0 3.8  --  3.5  CL 102 102 101 98  --  98  CO2 27 28 30  34*  --  29  GLUCOSE 196* 196* 228* 238*  --  171*  BUN 26* 27* 25* 21  --  18  CREATININE 1.00 0.88 0.72 0.68 0.70 0.62  CALCIUM  8.2* 8.0* 7.7* 7.4*  --  6.9*  MG  --   --   --   --   --  2.9*   Liver Function Tests: No results for input(s): AST, ALT, ALKPHOS, BILITOT, PROT, ALBUMIN in the last 168 hours. CBG: Recent Labs  Lab 06/20/24 1947 06/20/24 2149 06/21/24  9261 06/21/24 1144 06/21/24 1638  GLUCAP 201* 263* 197* 247* 158*    Discharge time spent: greater than 30 minutes.  Signed: Toribio Door, MD [facility] 06/21/2024

## 2024-06-21 NOTE — Progress Notes (Signed)
 Daily Progress Note   Patient Name: Bradley ELLENBURG Sr.       Date: 06/21/2024 DOB: Apr 22, 1941  Age: 83 y.o. MRN#: [id number] Attending Physician: Jadine Toribio SQUIBB, MD Primary Care Physician: Amon Aloysius BRAVO, MD Admit Date: 06/13/2024 Length of Stay: 7 days  Reason for Consultation/Follow-up: Establishing goals of care  Subjective:   CC: Patient notes breathing continues to improve.  Following up regarding complex medical decision making.  Subjective:  Reviewed EMR including recent documentation from [facility] Bradley, cardiology, and hospitalist.  At time of EMR review in past 24 hours, patient has received prn oral morphine  solution 7.6mg  x 2 doses.   Presented to bedside to see patient. No visitors at bedside. Patient laying comfortably in bed. Discussed patient's symptoms today. Patient does feel the oral morphine  solution is helping with his pain and breathing. He denies any adverse effects from morphine  and feels it can be slightly increased so noted would perform this.   Spent time discussing planning moving forward. Planning for patient to go home with hospice to this. Patient's family has been supporting this as well. Spent time answering questions as able. Patient worried about making sure he has oxygen support at home once discharged. Noted that team will make sure to confirm his oxygen will be at home before discharging him. Discussed may be able to go home today if oxygen and medications coordinated for this. Patient agreeing with plan. Spent time providing emotional support via active listening. Noted palliative medicine team would continue to follow along with patient's medical journey,  Discussed care with [facility], RN, and hospitalist to coordinate care. Likely able to discharge homme later today with [facility] support at home.   Objective:   Vital Signs:  BP 135/82 (BP Location: Right Arm)   Pulse 72   Temp 98.3 F (36.8 C)   Resp 18   Ht 5' 8 (1.727 m)   Wt 70.8 kg   SpO2  100%   BMI 23.72 kg/m   Physical Exam: General: NAD, alert, chronically ill-appearing Cardiovascular: RRR Respiratory: increased work of breathing noted on 5 L/min nasal cannula Neuro: A&Ox4, following commands easily Psych: appropriately answers all questions    Assessment & Plan:   Assessment: Patient is an 83 year old male with past medical history of hypertension, type 2 diabetes mellitus, PAD, CAD, atrial fibrillation status post AV nodal ablation not on anticoagulation due to history of ICH, tachybradycardia syndrome with pacemaker, HFpEF, and chronic hypoxic and hypercarbic respiratory failure in setting of COPD on 5 L nasal cannula at home was admitted on 06/13/2024 for management of shortness of breath and cough. Since admission patient has received management for COPD exacerbation and concern for possible volume overload in setting of HFpEF. Cardiology consulted for recommendations. Palliative medicine team consulted to assist with complex medical decision making.   Recommendations/Plan: # Complex medical decision making/goals of care:  - Patient and family supporting of patient returning home with hospice via [facility]. Hopefully able to discharge today if coordinated.              - Patient stated that he does have a living will naming his granddaughter, Bradley [name], and grandson, Bradley [name], as his medical decision makers if he is unable to make a medical decision himself.  Patient's wife passed away in July 28, 2023.  Patient's son is also deceased.                -  Code Status: Limited: Do not attempt  resuscitation (DNR) -DNR-LIMITED -Do Not Intubate/DNI                                 - Already placed signed DNR form on patient's chart.    # Symptom management Patient is receiving these palliative interventions for symptom management with an intent to improve quality of life.                 - Pain/shortness of breath, in setting of acute on chronic COPD and HFpEF                                - Increase morphine  solution to 10mg  every 4 hours as needed.   # Psycho-social/Spiritual Support:  - Support System: grandson- Bradley [name], granddaughter- Bradley [name], granddaughter's significant other- Bradley [name])  # Discharge Planning: Home with Hospice  Discussed with: patient, hospitalist, RN, [facility]  Thank you for allowing the palliative care team to participate in the care Bradley JINNY Oliva WagnerWagner Tinnie Radar, DO Palliative Care Provider PMT # [phone]  If patient remains symptomatic despite maximum doses, please call PMT at [phone] between 0700 and 1900. Outside of these hours, please call attending, as PMT does not have night coverage.

## 2024-06-21 NOTE — TOC Transition Note (Addendum)
 Transition of Care [facility]) - Discharge Note   Patient Details  Name: Bradley BERNASCONI Sr. MRN: [id number] Date of Birth: 1941/01/22  [facility] CM/SW Contact:  Bascom Service, RN Phone Number: 06/21/2024, 10:13 AM   Clinical Narrative: [facility] rep Angela accepted/approved for service-scheduled intake @ 11a w/family. DNR. Already has home 02.Has own transport home. -12p   the oxygen will be delivered by scheduled time-no issue w/oxygen delivery. Please confirm if d/c today or tomorrow since hospice intake takes place on d/c date.     Final next level of care: Home w Hospice Care Barriers to Discharge: No Barriers Identified   Patient Goals and CMS Choice Patient states their goals for this hospitalization and ongoing recovery are:: Home CMS Medicare.gov Compare Post Acute Care list provided to:: Patient Represenative (must comment) (Ralph(grand son in law)) Choice offered to / list presented to : Adult Children [place] ownership interest in [facility].provided to:: Adult Children    Discharge Placement                       Discharge Plan and Services Additional resources added to the After Visit Summary for     Discharge Planning Services: CM Consult Post Acute Care Choice: Hospice                               Social Drivers of Health (SDOH) Interventions SDOH Screenings   Food Insecurity: No Food Insecurity (06/13/2024)  Housing: Low Risk  (06/13/2024)  Transportation Needs: No Transportation Needs (06/13/2024)  Utilities: Not At Risk (06/13/2024)  Alcohol Screen: Low Risk  (04/11/2024)  Depression (PHQ2-9): Low Risk  (05/23/2024)  Financial Resource Strain: Low Risk  (04/11/2024)  Physical Activity: Insufficiently Active (04/11/2024)  Social Connections: Moderately Isolated (06/13/2024)  Stress: No Stress Concern Present (04/11/2024)  Tobacco Use: Medium Risk (06/13/2024)  Health Literacy: Adequate Health Literacy (04/11/2024)      Readmission Risk Interventions    07/07/2023    2:33 PM 08/29/2022    3:33 PM  Readmission Risk Prevention Plan  Transportation Screening Complete Complete  PCP or Specialist Appt within 3-5 Days Complete Complete  HRI or Home Care Consult Complete Complete  Social Work Consult for Recovery Care Planning/Counseling Complete Complete  Palliative Care Screening Not Applicable Not Applicable  Medication Review Oceanographer) Referral to Pharmacy Complete

## 2024-06-21 NOTE — Progress Notes (Signed)
  Progress Note  Patient Name: Bradley [name]. Date of Encounter: 06/21/2024 [facility] Cardiologist: Annabella Scarce, MD   Interval Summary   Patient reports feeling the same  Seen by palliative care yesterday, proceeding with getting hospice support at home  Vital Signs Vitals:   06/20/24 0517 06/20/24 1300 06/20/24 1946 06/21/24 0522  BP: 115/78 122/70 102/61 135/82  Pulse: 73 77 71 72  Resp: 18  15 18   Temp: 98.8 F (37.1 C) 98.4 F (36.9 C) 98.4 F (36.9 C) 98.3 F (36.8 C)  TempSrc:  Oral    SpO2: 98% 98% 98% 100%  Weight:      Height:        Intake/Output Summary (Last 24 hours) at 06/21/2024 0921 Last data filed at 06/21/2024 0915 Gross per 24 hour  Intake 600 ml  Output 800 ml  Net -200 ml      06/14/2024   12:00 AM 05/23/2024    9:10 AM 04/11/2024    1:51 PM  Last 3 Weights  Weight (lbs) 156 lb 172 lb 4 oz 185 lb  Weight (kg) 70.761 kg 78.132 kg 83.915 kg     Telemetry/ECG  V-paced, HR 70s, PVCs - Personally Reviewed  Physical Exam  GEN: No acute distress.   Neck: No JVD Cardiac: RRR, no murmurs, rubs, or gallops.  Respiratory: decreased breath sounds. GI: Soft, nontender, non-distended  MS: No edema  Assessment & Plan  Acute on chronic diastolic heart failure Mild RV dysfunction Pulmonary HTN COPD, on O2 at home  Echo: LVEF 50-55%, PASP 63 mmHg, mild RV dysfunction, severe RV enlargement, severe BAE, mild to moderate TR, RAP 15 mmHg LHC 09/2022: EF 50-55%, normal LVEDP, mild pulmonary HTN, minimal nonobstructive CAD, suspect group 2/3 PFTs 2021: severe restrictive lung disease, moderately severe diffusion defect Echo remains fairly unchanged from year prior  CTPA negative for PE ProBNP 6,826 Net - 6.6 L with minimal urine output yesterday, 400 cc charted Renal function has been stable  Currently on IV Lasix  40 mg BID Continue daily weights, strict I&O's, daily BMPs Seen by palliative medicine, going home with hospice therefore  will not proceed with sleep study/advanced heart failure follow-up   Persistent atrial fibrillation Sick sinus syndrome s/p PPM No long-term AC due to ICH history  Currently in A. Fib with HR 70s Continue bisoprolol  10 mg daily    Chest pain Troponin < 15 Patient reports chronic chest pain Suspect related to his COPD Echo: LVEF 50-55% LHC 09/2022: minimal nonobstructive CAD No further ischemic workup indicated, patient decided to go home with hospice care   For questions or updates, please contact [facility] Please consult [email or web address] for contact info under       Signed, Waddell DELENA Donath, PA-C

## 2024-07-09 NOTE — Progress Notes (Signed)
 Remote PPM Transmission

## 2024-07-10 ENCOUNTER — Telehealth: Payer: Self-pay

## 2024-07-10 NOTE — Telephone Encounter (Signed)
 Attempted to call pt, unable to reach and unable to LVM (VM box full).

## 2024-07-10 NOTE — Telephone Encounter (Signed)
 Pt called in wanting a refill on Nitroglycerin  because his pills fell in the toilet

## 2024-07-16 NOTE — Telephone Encounter (Signed)
Unable to reach pt or leave a message mailbox is full 

## 2024-07-20 NOTE — Telephone Encounter (Signed)
 Attempted to reach pt, unable to leave a message.

## 2024-08-13 ENCOUNTER — Ambulatory Visit (INDEPENDENT_AMBULATORY_CARE_PROVIDER_SITE_OTHER)

## 2024-08-13 ENCOUNTER — Other Ambulatory Visit (HOSPITAL_BASED_OUTPATIENT_CLINIC_OR_DEPARTMENT_OTHER): Payer: Self-pay | Admitting: Cardiovascular Disease

## 2024-08-13 DIAGNOSIS — I495 Sick sinus syndrome: Secondary | ICD-10-CM | POA: Diagnosis not present

## 2024-08-14 LAB — CUP PACEART REMOTE DEVICE CHECK
Battery Remaining Longevity: 112 mo
Battery Voltage: 3.01 V
Brady Statistic AP VP Percent: 0 %
Brady Statistic AP VS Percent: 0 %
Brady Statistic AS VP Percent: 97.65 %
Brady Statistic AS VS Percent: 2.35 %
Brady Statistic RA Percent Paced: 0 %
Brady Statistic RV Percent Paced: 97.65 %
Date Time Interrogation Session: 20251103054728
Implantable Lead Connection Status: 753985
Implantable Lead Connection Status: 753985
Implantable Lead Implant Date: 20121128
Implantable Lead Implant Date: 20121128
Implantable Lead Location: 753858
Implantable Lead Location: 753860
Implantable Lead Model: 4194
Implantable Lead Model: 5076
Implantable Pulse Generator Implant Date: 20240131
Lead Channel Impedance Value: 304 Ohm
Lead Channel Impedance Value: 3363 Ohm
Lead Channel Impedance Value: 3363 Ohm
Lead Channel Impedance Value: 418 Ohm
Lead Channel Impedance Value: 437 Ohm
Lead Channel Impedance Value: 475 Ohm
Lead Channel Impedance Value: 494 Ohm
Lead Channel Impedance Value: 589 Ohm
Lead Channel Impedance Value: 665 Ohm
Lead Channel Pacing Threshold Amplitude: 0.75 V
Lead Channel Pacing Threshold Amplitude: 1.5 V
Lead Channel Pacing Threshold Pulse Width: 0.4 ms
Lead Channel Pacing Threshold Pulse Width: 0.4 ms
Lead Channel Sensing Intrinsic Amplitude: 10.75 mV
Lead Channel Sensing Intrinsic Amplitude: 4.75 mV
Lead Channel Setting Pacing Amplitude: 2 V
Lead Channel Setting Pacing Amplitude: 2 V
Lead Channel Setting Pacing Pulse Width: 0.4 ms
Lead Channel Setting Pacing Pulse Width: 0.4 ms
Lead Channel Setting Sensing Sensitivity: 4 mV
Zone Setting Status: 755011

## 2024-08-16 ENCOUNTER — Ambulatory Visit: Payer: Self-pay | Admitting: Internal Medicine

## 2024-08-16 NOTE — Progress Notes (Signed)
 Remote PPM Transmission

## 2024-09-10 DEATH — deceased

## 2024-09-11 ENCOUNTER — Telehealth: Payer: Self-pay

## 2024-09-11 ENCOUNTER — Other Ambulatory Visit: Payer: Self-pay | Admitting: *Deleted

## 2024-09-11 DIAGNOSIS — M810 Age-related osteoporosis without current pathological fracture: Secondary | ICD-10-CM

## 2024-09-11 MED ORDER — DENOSUMAB 60 MG/ML ~~LOC~~ SOSY: 60.0000 mg | PREFILLED_SYRINGE | SUBCUTANEOUS | Status: DC

## 2024-09-11 NOTE — Telephone Encounter (Signed)
 Prolia  VOB initiated via [email or web address]  Next Prolia  inj DUE: 10/05/24

## 2024-09-18 ENCOUNTER — Other Ambulatory Visit (HOSPITAL_COMMUNITY): Payer: Self-pay

## 2024-09-18 NOTE — Telephone Encounter (Signed)
 Copied from CRM [id number]. Topic: General - Other >> Sep 18, 2024  3:14 PM Rea ORN wrote: Reason for CRM: Karima from [employer] called to advise they need to close out the pt's case because he does not have active insurance on file. She stated she called [employer] and they said he was not active. Sherral stated someone needs to get in touch with pt and insurance is verified they can go back into the [employer] and submit for [place] again.    Please call if there are any additional questions: [phone], select 1 then 1 again

## 2024-09-19 NOTE — Telephone Encounter (Signed)
 No insurance information on file. Please advise.

## 2024-09-19 NOTE — Telephone Encounter (Signed)
 Tried to call patient to bring in insurance card but vm was full.

## 2024-09-24 NOTE — Telephone Encounter (Signed)
 Tried calling again pt vm full and left a message on granddaughter Ambers vm for pt to bring in new ins card.

## 2024-09-27 NOTE — Telephone Encounter (Signed)
No answer/vm full

## 2024-09-28 ENCOUNTER — Other Ambulatory Visit (HOSPITAL_COMMUNITY): Payer: Self-pay

## 2024-10-09 ENCOUNTER — Encounter: Payer: Self-pay | Admitting: Internal Medicine

## 2024-10-09 ENCOUNTER — Ambulatory Visit: Payer: PRIVATE HEALTH INSURANCE | Admitting: Internal Medicine

## 2024-10-16 ENCOUNTER — Other Ambulatory Visit (HOSPITAL_COMMUNITY): Payer: Self-pay

## 2024-10-31 ENCOUNTER — Other Ambulatory Visit (HOSPITAL_COMMUNITY): Payer: Self-pay

## 2024-11-08 ENCOUNTER — Telehealth: Payer: Self-pay

## 2024-11-08 NOTE — Telephone Encounter (Signed)
 Pt passed away on Sep 05, 2024.

## 2024-11-12 ENCOUNTER — Encounter

## 2025-02-11 ENCOUNTER — Encounter

## 2025-04-17 ENCOUNTER — Ambulatory Visit

## 2025-05-13 ENCOUNTER — Encounter
